# Patient Record
Sex: Male | Born: 1965 | Race: White | Hispanic: No | Smoking: Former smoker
Health system: Southern US, Community
[De-identification: ages and names within clinical notes are randomized; demographics above are authoritative.]

## PROBLEM LIST (undated history)

## (undated) DIAGNOSIS — M5412 Radiculopathy, cervical region: Secondary | ICD-10-CM

## (undated) DIAGNOSIS — K219 Gastro-esophageal reflux disease without esophagitis: Secondary | ICD-10-CM

## (undated) DIAGNOSIS — U071 COVID-19: Secondary | ICD-10-CM

## (undated) DIAGNOSIS — G9341 Metabolic encephalopathy: Secondary | ICD-10-CM

## (undated) DIAGNOSIS — Q248 Other specified congenital malformations of heart: Secondary | ICD-10-CM

## (undated) DIAGNOSIS — R519 Headache, unspecified: Secondary | ICD-10-CM

## (undated) DIAGNOSIS — E876 Hypokalemia: Secondary | ICD-10-CM

## (undated) DIAGNOSIS — J189 Pneumonia, unspecified organism: Secondary | ICD-10-CM

## (undated) DIAGNOSIS — I219 Acute myocardial infarction, unspecified: Secondary | ICD-10-CM

## (undated) DIAGNOSIS — R0600 Dyspnea, unspecified: Secondary | ICD-10-CM

## (undated) DIAGNOSIS — G8929 Other chronic pain: Secondary | ICD-10-CM

## (undated) DIAGNOSIS — I639 Cerebral infarction, unspecified: Secondary | ICD-10-CM

## (undated) DIAGNOSIS — R339 Retention of urine, unspecified: Secondary | ICD-10-CM

## (undated) DIAGNOSIS — N289 Disorder of kidney and ureter, unspecified: Secondary | ICD-10-CM

## (undated) DIAGNOSIS — F4310 Post-traumatic stress disorder, unspecified: Secondary | ICD-10-CM

## (undated) DIAGNOSIS — J9601 Acute respiratory failure with hypoxia: Secondary | ICD-10-CM

## (undated) DIAGNOSIS — M1990 Unspecified osteoarthritis, unspecified site: Secondary | ICD-10-CM

## (undated) DIAGNOSIS — D649 Anemia, unspecified: Secondary | ICD-10-CM

## (undated) DIAGNOSIS — K922 Gastrointestinal hemorrhage, unspecified: Secondary | ICD-10-CM

## (undated) DIAGNOSIS — Z77098 Contact with and (suspected) exposure to other hazardous, chiefly nonmedicinal, chemicals: Secondary | ICD-10-CM

## (undated) HISTORY — DX: Acute myocardial infarction, unspecified: I21.9

## (undated) HISTORY — DX: Gastro-esophageal reflux disease without esophagitis: K21.9

## (undated) HISTORY — DX: Hypomagnesemia: E83.42

## (undated) HISTORY — PX: INGUINAL HERNIA REPAIR: SHX194

## (undated) HISTORY — PX: COLONOSCOPY: SHX174

## (undated) HISTORY — PX: GASTRIC BYPASS: SHX52

## (undated) HISTORY — DX: Hypokalemia: E87.6

## (undated) HISTORY — DX: COVID-19: U07.1

## (undated) HISTORY — PX: HERNIA REPAIR: SHX51

## (undated) HISTORY — PX: LAPAROSCOPIC INGUINAL HERNIA REPAIR: SUR788

## (undated) HISTORY — DX: Cerebral infarction, unspecified: I63.9

## (undated) HISTORY — PX: CHOLECYSTECTOMY: SHX55

---
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
## 1992-04-01 HISTORY — PX: GASTRIC BYPASS: SHX52

## 2002-11-10 ENCOUNTER — Ambulatory Visit (HOSPITAL_COMMUNITY): Admission: RE | Admit: 2002-11-10 | Discharge: 2002-11-11 | Payer: Self-pay | Admitting: Orthopaedic Surgery

## 2002-11-10 ENCOUNTER — Encounter: Payer: Self-pay | Admitting: Orthopaedic Surgery

## 2002-11-11 ENCOUNTER — Encounter: Payer: Self-pay | Admitting: Orthopaedic Surgery

## 2006-12-22 ENCOUNTER — Encounter: Admission: RE | Admit: 2006-12-22 | Discharge: 2006-12-22 | Payer: Self-pay | Admitting: Orthopedic Surgery

## 2010-08-17 NOTE — Op Note (Signed)
NAME:  Chase [name], Chase [name]                       ACCOUNT NO.:  [id number]   MEDICAL RECORD NO.:  [id number]                   PATIENT TYPE:  OIB   LOCATION:  5020                                 FACILITY:  [facility]   PHYSICIAN:  Rennis Harding, M.D.                     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   PROCEDURE:  1. Anterior cervical diskectomy C3-4 with bilateral foraminotomies.  2. Anterior cervical arthrodesis with placement of an 8 mm Synthes allograft     prosthesis space were packed with demineralized bone matrix.  3. Anterior cervical plating utilizing the EBI view lock system.  4. Neurological monitoring utilizing somatosensory evoked response and upper     extremity electromyographs.   SURGEON:  Rennis Harding, M.D.   ASSISTANT:  Karenann Cai, P.A.   ANESTHESIA:  General endotracheal.   COMPLICATIONS:  None.   INDICATIONS:  The patient is a 45 year old male with a 2 year history of  left upper extremity pain.  Plain radiographs show loss of the normal  cervical lordosis and degenerative changes with disk space narrowing at C3-  4, C4-5 and C5-6.  Oblique images show foraminal stenosis on the left at C3-  4.  His MRI scan shows foraminal narrowing at C3-4, left greater than right,  secondary to uncovertebral spurring.  He had a selective nerve root  injection on the left at C3-4 with several days of relief.  Because of the  persistent symptoms refractory to all conservative treatment options, he has  elected to undergo anterior cervical diskectomy and fusion at C3-4 with  hopes of improving his symptoms.  Risks, benefits and alternatives were  extensively reviewed.   DESCRIPTION OF PROCEDURE:  The patient was identified in the holding  area  and taken to the operating room.  He underwent general endotracheal  anesthesia without difficulty.  He was given prophylactic IV antibiotics.  He was carefully positioned on the operating room table with [place] head  rest.  His neck was placed in slight hyperextension.  Neural monitoring was  established in the form of SSEPs and upper extremity EMGs.  The neck was  prepped and draped in the usual sterile fashion.  A 3 cm incision was made  on the left side of his neck and natural skin crease at the level of thyroid  cartilage.  Dissection was carried through the platysma sharply.  The  interval between the SCM and the strap muscles was developed down to the  prevertebral space.  The first disk space identified was C5-6, and this was  confirmed with lateral radiography.  We then expanded the exposure to the C3-  4 disk space.  A new x-ray  was taken, confirming we had the appropriate  level.  The longus coli muscle was elevated out over the C3-4 disk space  bilaterally.  The esophagus, trachea, carotid sheath was identified, and  protected at all times.  The deep Shadowline retractor was placed.  Diskectomy was performed, packs in the TLL.  We identified uncovertebral  spurring on the left greater than the right.  A high speed bur was used to  remove the uncovertebral joints.  Foraminotomies were performed with 2 mm  Kerrison punches bilaterally.  We confirmed the foramen was patent using a  blunt probe.  Caspar distraction pins were placed.  Gentle distraction was  applied.  An 8 mm Synthes allograft prosthesis spacer was then placed,  packed with demineralized bone matrix.  The endplates were prepared with  curets.  An EBI view lock plate was placed with four 14 mm screws.  We  confirmed that the locking mechanism engaged.  The wound was irrigated.  The  esophagus, trachea and carotid sheath were inspected.  There were no  apparent injuries.  Bleeding was controlled with bipolar  electrocautery and  Gelfoam.  A deep Penrose drain was placed.  The platysma was closed with a 2-  0 interrupted Vicryl, subcutaneous layer closed with a 3-0 subcuticular  Vicryl suture.  Skin was approximated with a 4-0 running subcuticular  suture.  Benzoin and Steri-Strips were placed.  Sterile dressing applied.  Patient placed into a soft collar.  He was extubated without difficulty,  transferred to the recovery room in stable condition able to move his upper  and lower extremities.  There were no deleterious changes in the SSEPs or  EMGs throughout the procedure.                                               Rennis Harding, M.D.    MC/MEDQ  D:  11/10/2002  T:  11/11/2002  Job:  [id number]

## 2010-08-17 NOTE — H&P (Signed)
NAME:  Chase [name], Chase [name]                      ACCOUNT NO.:  [id number]   MEDICAL RECORD NO.:  [id number]                   PATIENT TYPE:  OIB   LOCATION:  2550                                 FACILITY:  [facility]   PHYSICIAN:  Rennis Harding, M.D.                     DATE OF BIRTH:  [birth year]   DATE OF ADMISSION:  11/10/2002  DATE OF DISCHARGE:                                HISTORY & PHYSICAL   CHIEF COMPLAINT:  Neck and left upper extremity pain.   HISTORY OF PRESENT ILLNESS:  The patient is a 45 year old male who has had a  longstanding history of neck and upper extremity pain. He has tried  conservative management including anti-inflammatory medications, pain  medications, activity modification, and time.  Unfortunately, none of these  treatments have given any lasting relief of his symptoms.  He continues to  have pain in his neck as well as causing him headaches, and extending down  into his left upper extremity.  The pain has gotten to the point that it is  affecting his activities of daily living, quality of life, and ability to  work.  Therefore, the risks and benefits of the proposed surgery were  discussed with the patient by Dr. Patrice Paradise as well as myself; he indicated  understanding and opted to proceed.   ALLERGIES:  No known drug allergies.   MEDICATIONS:  Darvocet and Flexeril both p.r.n.   PAST MEDICAL HISTORY:  Healthy.   PAST SURGICAL HISTORY:  Hernia repair in 2001.  Gastric bypass in 1997.  Cholecystectomy in 1994.  Umbilical hernia as a child.   SOCIAL HISTORY:  The patient denies tobacco use.  He drinks approximately  one beer per day.  He does have family and friends to help him through his  postoperative course.   FAMILY HISTORY:  Noncontributory.   REVIEW OF SYSTEMS:  GENERAL:  The patient denies any fevers, chills, sweats,  blurry vision, double vision, paralysis.  CARDIOVASCULAR:  Denies chest  pain, angina, orthopnea, claudication, or palpitations.  PULMONARY: Denies  shortness of breath, productive cough, or hemoptysis.  GASTROINTESTINAL:  Denies nausea, vomiting, constipation, diarrhea, melena, or bloody stool.  GENITOURINARY:  Denies dysuria, hematuria, or discharge.  MUSCULOSKELETAL:  Per HPI.   PHYSICAL EXAMINATION:  GENERAL:  The patient is a 45 year old male who is  alert and oriented and in no acute distress.  Well developed, well  nourished.  Appears stated age.  Pleasant and cooperative through  examination.  HEENT:  Normocephalic and atraumatic.  Pupils equal, round, and reactive to  light.  Extraocular movements intact.  Nose patent and pharynx is clear.  NECK:  Supple to palpation. No lymphadenopathy, thyromegaly, or bruits  appreciated.  CHEST:  Clear to auscultation bilaterally.  No rales, rhonchi, or wheezes.  HEART:  Regular rate and rhythm, no murmurs, rubs, or gallops noted. S1 and  S2.  BREASTS:  Not  pertinent and not performed.  ABDOMEN:  Soft with positive bowel sounds throughout.  Nondistended and  nontender.  No organomegaly noted.  GENITOURINARY:  Not pertinent and not performed.  EXTREMITIES:  The patient has left upper extremity pain.  NEUROLOGICAL:  Motor function and sensation are grossly intact on  examination.  SKIN:  Intact without lesions or rashes.   DIAGNOSTIC STUDIES:  MRI shows degenerative disk disease causing foraminal  stenosis at C3-4 on the left.   IMPRESSION:  1. Degenerative disk disease of the cervical spine.  2. 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   PLAN:  The patient was admitted to [facility] on November 10, 2002,  to undergo an ACDF of C3-4. This will be done by Dr. Rennis Harding.   PRIMARY CARE PHYSICIAN:  Marguerita Beards. Gilman Schmidt, M.D., [facility] [place], P.A.-C                 Rennis Harding, M.D.    CPM/MEDQ  D:  11/10/2002  T:  11/10/2002  Job:  [id number]   cc:   Marguerita Beards., M.D., [facility] [place]

## 2012-04-20 DIAGNOSIS — M961 Postlaminectomy syndrome, not elsewhere classified: Secondary | ICD-10-CM | POA: Insufficient documentation

## 2012-04-20 DIAGNOSIS — M2550 Pain in unspecified joint: Secondary | ICD-10-CM | POA: Insufficient documentation

## 2012-12-10 DIAGNOSIS — D510 Vitamin B12 deficiency anemia due to intrinsic factor deficiency: Secondary | ICD-10-CM | POA: Insufficient documentation

## 2012-12-10 DIAGNOSIS — C8190 Hodgkin lymphoma, unspecified, unspecified site: Secondary | ICD-10-CM | POA: Diagnosis present

## 2012-12-10 DIAGNOSIS — D509 Iron deficiency anemia, unspecified: Secondary | ICD-10-CM | POA: Insufficient documentation

## 2012-12-10 HISTORY — DX: Hodgkin lymphoma, unspecified, unspecified site: C81.90

## 2013-05-19 DIAGNOSIS — Z8719 Personal history of other diseases of the digestive system: Secondary | ICD-10-CM | POA: Insufficient documentation

## 2013-05-19 DIAGNOSIS — Z8711 Personal history of peptic ulcer disease: Secondary | ICD-10-CM | POA: Insufficient documentation

## 2013-05-19 DIAGNOSIS — N529 Male erectile dysfunction, unspecified: Secondary | ICD-10-CM | POA: Insufficient documentation

## 2013-05-19 HISTORY — DX: Personal history of peptic ulcer disease: Z87.11

## 2015-09-04 DIAGNOSIS — M542 Cervicalgia: Secondary | ICD-10-CM | POA: Insufficient documentation

## 2016-06-19 DIAGNOSIS — M47812 Spondylosis without myelopathy or radiculopathy, cervical region: Secondary | ICD-10-CM | POA: Diagnosis present

## 2017-04-01 DIAGNOSIS — I639 Cerebral infarction, unspecified: Secondary | ICD-10-CM

## 2017-04-01 HISTORY — DX: Cerebral infarction, unspecified: I63.9

## 2018-04-01 DIAGNOSIS — U071 COVID-19: Secondary | ICD-10-CM

## 2018-04-01 HISTORY — DX: COVID-19: U07.1

## 2019-08-24 DIAGNOSIS — Z9884 Bariatric surgery status: Secondary | ICD-10-CM | POA: Insufficient documentation

## 2019-08-24 DIAGNOSIS — E871 Hypo-osmolality and hyponatremia: Secondary | ICD-10-CM | POA: Insufficient documentation

## 2019-08-24 DIAGNOSIS — N179 Acute kidney failure, unspecified: Secondary | ICD-10-CM | POA: Diagnosis present

## 2019-08-24 DIAGNOSIS — E872 Acidosis, unspecified: Secondary | ICD-10-CM | POA: Insufficient documentation

## 2019-08-24 DIAGNOSIS — Z87898 Personal history of other specified conditions: Secondary | ICD-10-CM | POA: Insufficient documentation

## 2019-08-24 DIAGNOSIS — G928 Other toxic encephalopathy: Secondary | ICD-10-CM | POA: Insufficient documentation

## 2019-08-24 DIAGNOSIS — N400 Enlarged prostate without lower urinary tract symptoms: Secondary | ICD-10-CM

## 2019-08-24 DIAGNOSIS — B965 Pseudomonas (aeruginosa) (mallei) (pseudomallei) as the cause of diseases classified elsewhere: Secondary | ICD-10-CM

## 2019-08-24 DIAGNOSIS — D649 Anemia, unspecified: Secondary | ICD-10-CM | POA: Insufficient documentation

## 2019-08-24 DIAGNOSIS — L989 Disorder of the skin and subcutaneous tissue, unspecified: Secondary | ICD-10-CM | POA: Insufficient documentation

## 2019-08-24 DIAGNOSIS — Z8616 Personal history of COVID-19: Secondary | ICD-10-CM

## 2019-08-24 DIAGNOSIS — G92 Toxic encephalopathy: Secondary | ICD-10-CM | POA: Insufficient documentation

## 2019-08-24 DIAGNOSIS — N390 Urinary tract infection, site not specified: Secondary | ICD-10-CM | POA: Diagnosis present

## 2019-08-24 DIAGNOSIS — R0600 Dyspnea, unspecified: Secondary | ICD-10-CM | POA: Insufficient documentation

## 2019-08-24 HISTORY — DX: Other toxic encephalopathy: G92.8

## 2019-08-24 HISTORY — DX: Personal history of COVID-19: Z86.16

## 2019-08-24 HISTORY — DX: Bariatric surgery status: Z98.84

## 2019-08-24 HISTORY — DX: Pseudomonas (aeruginosa) (mallei) (pseudomallei) as the cause of diseases classified elsewhere: B96.5

## 2019-10-17 ENCOUNTER — Encounter (HOSPITAL_COMMUNITY): Payer: Self-pay

## 2019-10-17 ENCOUNTER — Emergency Department (HOSPITAL_COMMUNITY): Payer: No Typology Code available for payment source

## 2019-10-17 ENCOUNTER — Other Ambulatory Visit: Payer: Self-pay

## 2019-10-17 ENCOUNTER — Inpatient Hospital Stay (HOSPITAL_COMMUNITY)
Admission: EM | Admit: 2019-10-17 | Discharge: 2019-10-26 | DRG: 064 | Disposition: A | Discharge status: Skilled Nursing Facility | Payer: No Typology Code available for payment source | Attending: Internal Medicine | Admitting: Internal Medicine

## 2019-10-17 DIAGNOSIS — I6340 Cerebral infarction due to embolism of unspecified cerebral artery: Principal | ICD-10-CM | POA: Diagnosis present

## 2019-10-17 DIAGNOSIS — N179 Acute kidney failure, unspecified: Secondary | ICD-10-CM | POA: Diagnosis not present

## 2019-10-17 DIAGNOSIS — R64 Cachexia: Secondary | ICD-10-CM | POA: Diagnosis present

## 2019-10-17 DIAGNOSIS — E876 Hypokalemia: Secondary | ICD-10-CM | POA: Diagnosis present

## 2019-10-17 DIAGNOSIS — W19XXXA Unspecified fall, initial encounter: Secondary | ICD-10-CM | POA: Diagnosis not present

## 2019-10-17 DIAGNOSIS — R29810 Facial weakness: Secondary | ICD-10-CM | POA: Diagnosis present

## 2019-10-17 DIAGNOSIS — R233 Spontaneous ecchymoses: Secondary | ICD-10-CM | POA: Diagnosis present

## 2019-10-17 DIAGNOSIS — Z923 Personal history of irradiation: Secondary | ICD-10-CM

## 2019-10-17 DIAGNOSIS — G8194 Hemiplegia, unspecified affecting left nondominant side: Secondary | ICD-10-CM | POA: Diagnosis present

## 2019-10-17 DIAGNOSIS — G894 Chronic pain syndrome: Secondary | ICD-10-CM | POA: Diagnosis present

## 2019-10-17 DIAGNOSIS — D6959 Other secondary thrombocytopenia: Secondary | ICD-10-CM | POA: Diagnosis present

## 2019-10-17 DIAGNOSIS — Z79899 Other long term (current) drug therapy: Secondary | ICD-10-CM

## 2019-10-17 DIAGNOSIS — I2510 Atherosclerotic heart disease of native coronary artery without angina pectoris: Secondary | ICD-10-CM | POA: Diagnosis present

## 2019-10-17 DIAGNOSIS — R778 Other specified abnormalities of plasma proteins: Secondary | ICD-10-CM

## 2019-10-17 DIAGNOSIS — N323 Diverticulum of bladder: Secondary | ICD-10-CM | POA: Diagnosis present

## 2019-10-17 DIAGNOSIS — N401 Enlarged prostate with lower urinary tract symptoms: Secondary | ICD-10-CM | POA: Diagnosis present

## 2019-10-17 DIAGNOSIS — D631 Anemia in chronic kidney disease: Secondary | ICD-10-CM | POA: Diagnosis present

## 2019-10-17 DIAGNOSIS — I63523 Cerebral infarction due to unspecified occlusion or stenosis of bilateral anterior cerebral arteries: Secondary | ICD-10-CM | POA: Diagnosis not present

## 2019-10-17 DIAGNOSIS — Z8249 Family history of ischemic heart disease and other diseases of the circulatory system: Secondary | ICD-10-CM

## 2019-10-17 DIAGNOSIS — C8590 Non-Hodgkin lymphoma, unspecified, unspecified site: Secondary | ICD-10-CM | POA: Diagnosis not present

## 2019-10-17 DIAGNOSIS — K219 Gastro-esophageal reflux disease without esophagitis: Secondary | ICD-10-CM | POA: Diagnosis present

## 2019-10-17 DIAGNOSIS — Z886 Allergy status to analgesic agent status: Secondary | ICD-10-CM

## 2019-10-17 DIAGNOSIS — G8921 Chronic pain due to trauma: Secondary | ICD-10-CM | POA: Diagnosis not present

## 2019-10-17 DIAGNOSIS — R338 Other retention of urine: Secondary | ICD-10-CM | POA: Diagnosis present

## 2019-10-17 DIAGNOSIS — N136 Pyonephrosis: Secondary | ICD-10-CM | POA: Diagnosis present

## 2019-10-17 DIAGNOSIS — R29714 NIHSS score 14: Secondary | ICD-10-CM | POA: Diagnosis present

## 2019-10-17 DIAGNOSIS — F17220 Nicotine dependence, chewing tobacco, uncomplicated: Secondary | ICD-10-CM | POA: Diagnosis present

## 2019-10-17 DIAGNOSIS — N185 Chronic kidney disease, stage 5: Secondary | ICD-10-CM

## 2019-10-17 DIAGNOSIS — R531 Weakness: Secondary | ICD-10-CM | POA: Diagnosis present

## 2019-10-17 DIAGNOSIS — I248 Other forms of acute ischemic heart disease: Secondary | ICD-10-CM | POA: Diagnosis present

## 2019-10-17 DIAGNOSIS — D696 Thrombocytopenia, unspecified: Secondary | ICD-10-CM

## 2019-10-17 DIAGNOSIS — J984 Other disorders of lung: Secondary | ICD-10-CM | POA: Diagnosis present

## 2019-10-17 DIAGNOSIS — Z681 Body mass index (BMI) 19 or less, adult: Secondary | ICD-10-CM | POA: Diagnosis not present

## 2019-10-17 DIAGNOSIS — L89151 Pressure ulcer of sacral region, stage 1: Secondary | ICD-10-CM | POA: Diagnosis present

## 2019-10-17 DIAGNOSIS — R5381 Other malaise: Secondary | ICD-10-CM | POA: Diagnosis present

## 2019-10-17 DIAGNOSIS — I639 Cerebral infarction, unspecified: Secondary | ICD-10-CM | POA: Diagnosis not present

## 2019-10-17 DIAGNOSIS — N184 Chronic kidney disease, stage 4 (severe): Secondary | ICD-10-CM

## 2019-10-17 DIAGNOSIS — N390 Urinary tract infection, site not specified: Secondary | ICD-10-CM | POA: Diagnosis not present

## 2019-10-17 DIAGNOSIS — N3000 Acute cystitis without hematuria: Secondary | ICD-10-CM

## 2019-10-17 DIAGNOSIS — I6389 Other cerebral infarction: Secondary | ICD-10-CM | POA: Diagnosis not present

## 2019-10-17 DIAGNOSIS — I63413 Cerebral infarction due to embolism of bilateral middle cerebral arteries: Secondary | ICD-10-CM

## 2019-10-17 DIAGNOSIS — G8929 Other chronic pain: Secondary | ICD-10-CM | POA: Diagnosis present

## 2019-10-17 DIAGNOSIS — F4310 Post-traumatic stress disorder, unspecified: Secondary | ICD-10-CM | POA: Diagnosis not present

## 2019-10-17 DIAGNOSIS — B965 Pseudomonas (aeruginosa) (mallei) (pseudomallei) as the cause of diseases classified elsewhere: Secondary | ICD-10-CM | POA: Diagnosis present

## 2019-10-17 DIAGNOSIS — Z20822 Contact with and (suspected) exposure to covid-19: Secondary | ICD-10-CM | POA: Diagnosis present

## 2019-10-17 DIAGNOSIS — E872 Acidosis: Secondary | ICD-10-CM | POA: Diagnosis present

## 2019-10-17 DIAGNOSIS — H5461 Unqualified visual loss, right eye, normal vision left eye: Secondary | ICD-10-CM | POA: Diagnosis present

## 2019-10-17 DIAGNOSIS — E785 Hyperlipidemia, unspecified: Secondary | ICD-10-CM | POA: Diagnosis present

## 2019-10-17 DIAGNOSIS — E538 Deficiency of other specified B group vitamins: Secondary | ICD-10-CM | POA: Diagnosis present

## 2019-10-17 DIAGNOSIS — Z9884 Bariatric surgery status: Secondary | ICD-10-CM

## 2019-10-17 DIAGNOSIS — R296 Repeated falls: Secondary | ICD-10-CM | POA: Diagnosis present

## 2019-10-17 DIAGNOSIS — I4891 Unspecified atrial fibrillation: Secondary | ICD-10-CM | POA: Diagnosis present

## 2019-10-17 DIAGNOSIS — Z79891 Long term (current) use of opiate analgesic: Secondary | ICD-10-CM

## 2019-10-17 DIAGNOSIS — W57XXXA Bitten or stung by nonvenomous insect and other nonvenomous arthropods, initial encounter: Secondary | ICD-10-CM | POA: Diagnosis present

## 2019-10-17 DIAGNOSIS — E43 Unspecified severe protein-calorie malnutrition: Secondary | ICD-10-CM | POA: Diagnosis present

## 2019-10-17 DIAGNOSIS — R7989 Other specified abnormal findings of blood chemistry: Secondary | ICD-10-CM

## 2019-10-17 DIAGNOSIS — M21372 Foot drop, left foot: Secondary | ICD-10-CM | POA: Diagnosis present

## 2019-10-17 HISTORY — DX: Chronic kidney disease, stage 4 (severe): N18.4

## 2019-10-17 HISTORY — DX: Retention of urine, unspecified: R33.9

## 2019-10-17 HISTORY — DX: Contact with and (suspected) exposure to other hazardous, chiefly nonmedicinal, chemicals: Z77.098

## 2019-10-17 HISTORY — DX: Gastrointestinal hemorrhage, unspecified: K92.2

## 2019-10-17 HISTORY — DX: Radiculopathy, cervical region: M54.12

## 2019-10-17 HISTORY — DX: Disorder of kidney and ureter, unspecified: N28.9

## 2019-10-17 HISTORY — DX: Post-traumatic stress disorder, unspecified: F43.10

## 2019-10-17 HISTORY — DX: Weakness: R53.1

## 2019-10-17 HISTORY — DX: Other chronic pain: G89.29

## 2019-10-17 LAB — RAPID URINE DRUG SCREEN, HOSP PERFORMED
Amphetamines: NOT DETECTED
Barbiturates: NOT DETECTED
Benzodiazepines: NOT DETECTED
Cocaine: NOT DETECTED
Opiates: NOT DETECTED
Tetrahydrocannabinol: NOT DETECTED

## 2019-10-17 LAB — I-STAT CHEM 8, ED
BUN: 38 mg/dL — ABNORMAL HIGH (ref 6–20)
Calcium, Ion: 1.15 mmol/L (ref 1.15–1.40)
Chloride: 108 mmol/L (ref 98–111)
Creatinine, Ser: 4.1 mg/dL — ABNORMAL HIGH (ref 0.61–1.24)
Glucose, Bld: 79 mg/dL (ref 70–99)
HCT: 27 % — ABNORMAL LOW (ref 39.0–52.0)
Hemoglobin: 9.2 g/dL — ABNORMAL LOW (ref 13.0–17.0)
Potassium: 3.1 mmol/L — ABNORMAL LOW (ref 3.5–5.1)
Sodium: 137 mmol/L (ref 135–145)
TCO2: 15 mmol/L — ABNORMAL LOW (ref 22–32)

## 2019-10-17 LAB — URINALYSIS, ROUTINE W REFLEX MICROSCOPIC
Bilirubin Urine: NEGATIVE
Glucose, UA: NEGATIVE mg/dL
Ketones, ur: NEGATIVE mg/dL
Nitrite: NEGATIVE
Protein, ur: 30 mg/dL — AB
Specific Gravity, Urine: 1.008 (ref 1.005–1.030)
WBC, UA: >50 WBC/hpf — ABNORMAL HIGH (ref 0–5)
pH: 7 (ref 5.0–8.0)

## 2019-10-17 LAB — DIFFERENTIAL
Abs Immature Granulocytes: 0.28 10*3/uL — ABNORMAL HIGH (ref 0.00–0.07)
Basophils Absolute: 0 10*3/uL (ref 0.0–0.1)
Basophils Relative: 0 %
Eosinophils Absolute: 0 10*3/uL (ref 0.0–0.5)
Eosinophils Relative: 0 %
Immature Granulocytes: 2 %
Lymphocytes Relative: 2 %
Lymphs Abs: 0.4 10*3/uL — ABNORMAL LOW (ref 0.7–4.0)
Monocytes Absolute: 0.4 10*3/uL (ref 0.1–1.0)
Monocytes Relative: 2 %
Neutro Abs: 17.7 10*3/uL — ABNORMAL HIGH (ref 1.7–7.7)
Neutrophils Relative %: 94 %

## 2019-10-17 LAB — PROTIME-INR
INR: 1.5 — ABNORMAL HIGH (ref 0.8–1.2)
Prothrombin Time: 17.7 seconds — ABNORMAL HIGH (ref 11.4–15.2)

## 2019-10-17 LAB — CBC
HCT: 26.5 % — ABNORMAL LOW (ref 39.0–52.0)
Hemoglobin: 8.6 g/dL — ABNORMAL LOW (ref 13.0–17.0)
MCH: 29.1 pg (ref 26.0–34.0)
MCHC: 32.5 g/dL (ref 30.0–36.0)
MCV: 89.5 fL (ref 80.0–100.0)
Platelets: 69 10*3/uL — ABNORMAL LOW (ref 150–400)
RBC: 2.96 MIL/uL — ABNORMAL LOW (ref 4.22–5.81)
RDW: 17.2 % — ABNORMAL HIGH (ref 11.5–15.5)
WBC: 18.8 10*3/uL — ABNORMAL HIGH (ref 4.0–10.5)
nRBC: 0 % (ref 0.0–0.2)

## 2019-10-17 LAB — LACTATE DEHYDROGENASE: LDH: 120 U/L (ref 98–192)

## 2019-10-17 LAB — COMPREHENSIVE METABOLIC PANEL
ALT: 18 U/L (ref 0–44)
AST: 18 U/L (ref 15–41)
Albumin: 2 g/dL — ABNORMAL LOW (ref 3.5–5.0)
Alkaline Phosphatase: 96 U/L (ref 38–126)
Anion gap: 9 (ref 5–15)
BUN: 42 mg/dL — ABNORMAL HIGH (ref 6–20)
CO2: 13 mmol/L — ABNORMAL LOW (ref 22–32)
Calcium: 7.4 mg/dL — ABNORMAL LOW (ref 8.9–10.3)
Chloride: 111 mmol/L (ref 98–111)
Creatinine, Ser: 3.74 mg/dL — ABNORMAL HIGH (ref 0.61–1.24)
GFR calc Af Amer: 20 mL/min — ABNORMAL LOW (ref >60)
GFR calc non Af Amer: 17 mL/min — ABNORMAL LOW (ref >60)
Glucose, Bld: 82 mg/dL (ref 70–99)
Potassium: 3.1 mmol/L — ABNORMAL LOW (ref 3.5–5.1)
Sodium: 133 mmol/L — ABNORMAL LOW (ref 135–145)
Total Bilirubin: 0.4 mg/dL (ref 0.3–1.2)
Total Protein: 5.6 g/dL — ABNORMAL LOW (ref 6.5–8.1)

## 2019-10-17 LAB — CBG MONITORING, ED: Glucose-Capillary: 64 mg/dL — ABNORMAL LOW (ref 70–99)

## 2019-10-17 LAB — ETHANOL: Alcohol, Ethyl (B): <10 mg/dL (ref <10)

## 2019-10-17 LAB — TROPONIN I (HIGH SENSITIVITY)
Troponin I (High Sensitivity): 308 ng/L (ref <18)
Troponin I (High Sensitivity): 300 ng/L (ref <18)
Troponin I (High Sensitivity): 313 ng/L (ref <18)

## 2019-10-17 LAB — DIC (DISSEMINATED INTRAVASCULAR COAGULATION)PANEL
D-Dimer, Quant: 3.19 ug/mL-FEU — ABNORMAL HIGH (ref 0.00–0.50)
Fibrinogen: 258 mg/dL (ref 210–475)
INR: 1.6 — ABNORMAL HIGH (ref 0.8–1.2)
Platelets: 68 10*3/uL — ABNORMAL LOW (ref 150–400)
Prothrombin Time: 18.8 seconds — ABNORMAL HIGH (ref 11.4–15.2)
aPTT: 46 seconds — ABNORMAL HIGH (ref 24–36)

## 2019-10-17 LAB — TYPE AND SCREEN
ABO/RH(D): O POS
Antibody Screen: NEGATIVE

## 2019-10-17 LAB — APTT: aPTT: 43 seconds — ABNORMAL HIGH (ref 24–36)

## 2019-10-17 LAB — SARS CORONAVIRUS 2 BY RT PCR (HOSPITAL ORDER, PERFORMED IN ~~LOC~~ HOSPITAL LAB): SARS Coronavirus 2: NEGATIVE

## 2019-10-17 LAB — ABO/RH: ABO/RH(D): O POS

## 2019-10-17 MED ORDER — ACETAMINOPHEN 160 MG/5ML PO SOLN: 650.0000 mg | ORAL | Status: DC | PRN

## 2019-10-17 MED ORDER — GABAPENTIN 300 MG PO CAPS
300.0000 mg | ORAL_CAPSULE | Freq: Two times a day (BID) | ORAL | Status: DC
  Administered 2019-10-17 – 2019-10-26 (×18): 300 mg via ORAL
  Filled 2019-10-17 (×18): qty 1

## 2019-10-17 MED ORDER — ASPIRIN 325 MG PO TABS
325.0000 mg | ORAL_TABLET | Freq: Every day | ORAL | Status: DC
  Filled 2019-10-17: qty 1

## 2019-10-17 MED ORDER — SODIUM CHLORIDE 0.9 % IV BOLUS
1000.0000 mL | Freq: Once | INTRAVENOUS | Status: AC
  Administered 2019-10-17: 1000 mL via INTRAVENOUS

## 2019-10-17 MED ORDER — ACETAMINOPHEN 650 MG RE SUPP: 650.0000 mg | RECTAL | Status: DC | PRN

## 2019-10-17 MED ORDER — HYDROXYZINE HCL 10 MG PO TABS
10.0000 mg | ORAL_TABLET | Freq: Three times a day (TID) | ORAL | Status: DC | PRN
  Filled 2019-10-17: qty 1

## 2019-10-17 MED ORDER — PANTOPRAZOLE SODIUM 40 MG PO TBEC
40.0000 mg | DELAYED_RELEASE_TABLET | Freq: Two times a day (BID) | ORAL | Status: DC
  Administered 2019-10-17 – 2019-10-26 (×18): 40 mg via ORAL
  Filled 2019-10-17 (×18): qty 1

## 2019-10-17 MED ORDER — ATORVASTATIN CALCIUM 40 MG PO TABS
40.0000 mg | ORAL_TABLET | Freq: Every day | ORAL | Status: DC
  Administered 2019-10-17 – 2019-10-18 (×2): 40 mg via ORAL
  Filled 2019-10-17 (×2): qty 1

## 2019-10-17 MED ORDER — SODIUM CHLORIDE 0.9 % IV SOLN
2.0000 g | Freq: Once | INTRAVENOUS | Status: AC
  Administered 2019-10-17: 2 g via INTRAVENOUS
  Filled 2019-10-17: qty 20

## 2019-10-17 MED ORDER — POTASSIUM CHLORIDE CRYS ER 20 MEQ PO TBCR
40.0000 meq | EXTENDED_RELEASE_TABLET | Freq: Once | ORAL | Status: AC
  Administered 2019-10-17: 40 meq via ORAL
  Filled 2019-10-17: qty 2

## 2019-10-17 MED ORDER — DULOXETINE HCL 30 MG PO CPEP
60.0000 mg | ORAL_CAPSULE | Freq: Every day | ORAL | Status: DC
  Administered 2019-10-18 – 2019-10-26 (×9): 60 mg via ORAL
  Filled 2019-10-17 (×10): qty 2

## 2019-10-17 MED ORDER — STROKE: EARLY STAGES OF RECOVERY BOOK
Freq: Once | Status: AC
  Filled 2019-10-17: qty 1

## 2019-10-17 MED ORDER — TAMSULOSIN HCL 0.4 MG PO CAPS
0.4000 mg | ORAL_CAPSULE | Freq: Every day | ORAL | Status: DC
  Administered 2019-10-18 – 2019-10-26 (×9): 0.4 mg via ORAL
  Filled 2019-10-17 (×9): qty 1

## 2019-10-17 MED ORDER — DOXYCYCLINE HYCLATE 100 MG PO TABS
100.0000 mg | ORAL_TABLET | Freq: Two times a day (BID) | ORAL | Status: DC
  Administered 2019-10-17 – 2019-10-18 (×2): 100 mg via ORAL
  Filled 2019-10-17 (×2): qty 1

## 2019-10-17 MED ORDER — SODIUM CHLORIDE 0.9 % IV SOLN: INTRAVENOUS | Status: DC

## 2019-10-17 MED ORDER — ACETAMINOPHEN 325 MG PO TABS
650.0000 mg | ORAL_TABLET | ORAL | Status: DC | PRN
  Administered 2019-10-23 – 2019-10-24 (×3): 650 mg via ORAL
  Filled 2019-10-17 (×3): qty 2

## 2019-10-17 MED ORDER — HYDROMORPHONE HCL 1 MG/ML IJ SOLN: 0.5000 mg | INTRAMUSCULAR | Status: DC | PRN

## 2019-10-17 MED ORDER — IOHEXOL 350 MG/ML SOLN
75.0000 mL | Freq: Once | INTRAVENOUS | Status: AC | PRN
  Administered 2019-10-17: 75 mL via INTRAVENOUS

## 2019-10-17 MED ORDER — DULOXETINE HCL 30 MG PO CPEP
30.0000 mg | ORAL_CAPSULE | Freq: Every day | ORAL | Status: DC
  Administered 2019-10-17 – 2019-10-25 (×9): 30 mg via ORAL
  Filled 2019-10-17 (×9): qty 1

## 2019-10-17 MED ORDER — TRAMADOL HCL 50 MG PO TABS
50.0000 mg | ORAL_TABLET | Freq: Two times a day (BID) | ORAL | Status: DC
  Administered 2019-10-17 – 2019-10-18 (×3): 50 mg via ORAL
  Filled 2019-10-17 (×3): qty 1

## 2019-10-17 MED ORDER — GABAPENTIN 400 MG PO CAPS: 400.0000 mg | ORAL_CAPSULE | Freq: Three times a day (TID) | ORAL | Status: DC

## 2019-10-17 MED ORDER — SODIUM BICARBONATE 650 MG PO TABS
325.0000 mg | ORAL_TABLET | Freq: Three times a day (TID) | ORAL | Status: DC
  Administered 2019-10-17 – 2019-10-19 (×5): 325 mg via ORAL
  Filled 2019-10-17 (×5): qty 1

## 2019-10-17 MED ORDER — ASPIRIN 300 MG RE SUPP: 300.0000 mg | Freq: Every day | RECTAL | Status: DC

## 2019-10-17 MED ORDER — MIDODRINE HCL 5 MG PO TABS
2.5000 mg | ORAL_TABLET | Freq: Three times a day (TID) | ORAL | Status: DC
  Administered 2019-10-18 – 2019-10-26 (×25): 2.5 mg via ORAL
  Filled 2019-10-17 (×24): qty 1

## 2019-10-17 NOTE — Progress Notes (Signed)
Code Stroke Time Documentation   2217 Call Time 1232 Beeper Time 9810 Exam Started  2548 Exam [id number] Images sent to [place] Exam completed in [facility] radiology called

## 2019-10-17 NOTE — Consult Note (Signed)
[facility]urology Consult Services   Date of Service:   10/17/2019 12:41:37  Impression:     .  I63.9 - Cerebrovascular accident (CVA), unspecified mechanism ([place])  Comments/Sign-Out: 54 year old male who presents to the hospital because of left side weakness. presentation concerning for possible acute stroke.  Metrics: Last Known Well: 10/16/2019 21:30:00 [facility] Notification Time: 10/17/2019 12:41:36 Arrival Time: 10/17/2019 12:10:00 Stamp Time: 10/17/2019 12:41:37 Time First Login Attempt: 10/17/2019 12:47:00 Symptoms: Left side weakness NIHSS Start Assessment Time: 10/17/2019 12:55:00 Patient is not a candidate for Thrombolytic. Thrombolytic Medical Decision: 10/17/2019 12:52:36 Patient was not deemed candidate for Thrombolytic because of following reasons: Last Well Known Above 4.5 Hours.  CT head showed no acute hemorrhage or acute core infarct.  ED Physician notified of diagnostic impression and management plan on 10/17/2019 13:08:30  Advanced Imaging: CTA Head and Neck Completed.  LVO:No   Our recommendations are outlined below.  Recommendations:     .  Activate Stroke Protocol Admission/Order Set     .  Stroke/Telemetry Floor     .  Neuro Checks     .  Bedside Swallow Eval     .  DVT Prophylaxis     .  IV Fluids, Normal Saline     .  Head of Bed 30 Degrees     .  Euglycemia and Avoid Hyperthermia (PRN Acetaminophen)     .  Antiplatelet Therapy Recommended  Routine Consultation with [facility] for Follow up Care  Sign Out:     .  Discussed with Emergency Department Provider    ------------------------------------------------------------------------------  History of Present Illness: Patient is a 54 year old Male.  Patient was brought by EMS for symptoms of Left side weakness  54 year old male who presents to the hospital because of left arm and leg weakness. Patient was last normal when he went to bed  last night and when he woke up this morning he was unable to move his left side. Initially in the ED patient was moving his left arm spontaneously but would not move to command on teleneuro evaluation.           Examination: BP(100/68), Pulse(62), Blood Glucose(64) 1A: Level of Consciousness - Alert; keenly responsive + 0 1B: Ask Month and Age - Both Questions Right + 0 1C: Blink Eyes & Squeeze Hands - Performs Both Tasks + 0 2: Test Horizontal Extraocular Movements - Normal + 0 3: Test Visual Fields - No Visual Loss + 0 4: Test Facial Palsy (Use Grimace if Obtunded) - Normal symmetry + 0 5A: Test Left Arm Motor Drift - No Effort Against Gravity + 3 5B: Test Right Arm Motor Drift - No Drift for 10 Seconds + 0 6A: Test Left Leg Motor Drift - No Effort Against Gravity + 3 6B: Test Right Leg Motor Drift - No Drift for 5 Seconds + 0 7: Test Limb Ataxia (FNF/Heel-Shin) - No Ataxia + 0 8: Test Sensation - Mild-Moderate Loss: Less Sharp/More Dull + 1 9: Test Language/Aphasia - Normal; No aphasia + 0 10: Test Dysarthria - Normal + 0 11: Test Extinction/Inattention - No abnormality + 0  NIHSS Score: 7  Pre-Morbid Modified Rankin Scale: Unable to assess     Patient is being evaluated for possible acute neurologic impairment and high probability of imminent or life-threatening deterioration. I spent total of 35 minutes providing care to this patient, including time for face to face visit via telemedicine, review of medical records, imaging studies  and discussion of findings with providers, the patient and/or family.   Dr Tsosie Billing   [facility] [phone]  Case [id number]

## 2019-10-17 NOTE — ED Notes (Signed)
Lattie Haw, RN on screen but screen is frozen

## 2019-10-17 NOTE — Progress Notes (Signed)
Patient arrived to unit A/O x3. Vitals stable. Orders initiated. Bed in low position, alarm on , and call bell within reach. Neurology notified of patient arrival.  Will continue to monitor.  Gwendolyn Grant, RN

## 2019-10-17 NOTE — ED Notes (Signed)
covid spec to lab  Tick found attached to pt posterior neck   It is removed by physician

## 2019-10-17 NOTE — ED Notes (Signed)
Report to [facility], [employer] 

## 2019-10-17 NOTE — ED Notes (Signed)
CRITICAL VALUE ALERT  Critical Value:  Troponin 308  Date & Time Notied:  10/17/19 1354  Provider Notified: Dr. Langston Masker  Orders Received/Actions taken: EDP notified. Orders given for dissection testing. Jeris Penta, primary RN notified as well.

## 2019-10-17 NOTE — ED Notes (Signed)
Call to Son  Rm at [facility] given along w main number

## 2019-10-17 NOTE — ED Notes (Signed)
Followed by Chase [name] Has appt tomorrow   Reports went to bed last night today awakened and fell to the left sitting up - then fell out of bed   In ED makes sffort to raise L arm, but cannot   Cannot raise either R or L leg   Wound to R shin "where I fell"   Pt is alert, responds to questions with appropriate answers   Identifies all pictures on stroke eval -

## 2019-10-17 NOTE — ED Notes (Signed)
Call to son

## 2019-10-17 NOTE — ED Notes (Signed)
Report to [place], RN [employer] [facility]

## 2019-10-17 NOTE — ED Notes (Signed)
Call from lab  Trop elevated   phy concerned for disection

## 2019-10-17 NOTE — ED Notes (Signed)
[facility] here for pt. 

## 2019-10-17 NOTE — ED Notes (Signed)
hospitalist in to eval 

## 2019-10-17 NOTE — ED Provider Notes (Signed)
[facility] EMERGENCY DEPARTMENT Provider Note   CSN: [id number] Arrival date & time: 10/17/19  1210  An emergency department physician performed an initial assessment on this suspected stroke patient at 1230.  History Chief Complaint  Patient presents with  . Left sided weakness    Chase [name] is a 54 y.o. male presented emergency department with left-sided weakness.  Patient reports he went to bed around 9 or 10 last night was feeling normal at that time.  He said he woke up this morning feeling extreme weakness in his left side.  He said he is been falling to his left.  He cannot feel his left arm or leg.  He is able to lift his left arm but not his left leg.  Normally he can walk and is independent at baseline.  He lives by himself.  He reports to me that a history of anemia but cannot provide further information.  He is not on blood thinners.  Follows at [facility].  He denies a headache but feels very lightheaded and tired.  Update - I reached his son Zade Falkner by phone approx 1.5 hours after his arrival.  His son clarifies that his father has been "in and out of the hospital nonstop for the past 3 years," and says he left the hospital about 1.5 months ago with a new indwelling foley catheter at that time due to a bladder mass causing outlet obstruction.  He has not had that foley changed over.  His son tells me the patient normally has some left sided leg weakness at baseline and has been "falling a lot" but has been able to ambulate with a cane.  His son tells me his father has chronic kidney disease.  He feels his father has not been caring for himself.  His son does not live with his father.    HPI     Past Medical History:  Diagnosis Date  . Chemical exposure    service related injury  . PTSD (post-traumatic stress disorder)   . Renal disorder     Patient Active Problem List   Diagnosis Date Noted  . Acute left-sided weakness 10/17/2019  . CKD (chronic  kidney disease), stage IV ([place]) 10/17/2019  . Chronic pain 10/17/2019  . Fall 10/17/2019  . Acute lower UTI 10/17/2019    Past Surgical History:  Procedure Laterality Date  . GASTRIC BYPASS    . HERNIA REPAIR         No family history on file.  Social History   Tobacco Use  . Smoking status: Never Smoker  . Smokeless tobacco: Current User  Substance Use Topics  . Alcohol use: Never  . Drug use: Never    Home Medications Prior to Admission medications   Medication Sig Start Date End Date Taking? Authorizing Provider  cyanocobalamin (,VITAMIN B-12,) 1000 MCG/ML injection Inject into the muscle. 12/28/13  Yes [provider]  DULoxetine (CYMBALTA) 30 MG capsule Take 30-60 mg by mouth daily. Take 2 capsules in the morning and 1 capsule in the evening 08/12/17  Yes [provider]  gabapentin (NEURONTIN) 800 MG tablet Take 3 tablets by mouth at bedtime. 09/14/19  Yes [provider]  hydrOXYzine (ATARAX/VISTARIL) 10 MG tablet TAKE 1-2 TABLET(S) BY MOUTH TWICE A DAY AS NEEDED 09/08/18  Yes [provider]  midodrine (PROAMATINE) 2.5 MG tablet Take 1 tablet by mouth 3 (three) times daily. 09/21/18  Yes [provider]  pantoprazole (PROTONIX) 40  MG tablet TAKE ONE TABLET BY MOUTH TWICE A DAY (TAKE ON AN EMPTY STOMACH 30 MINUTES PRIOR TO A MEAL) FOR REFLUX 06/09/18  Yes [provider]  tamsulosin (FLOMAX) 0.4 MG CAPS capsule Take by mouth.   Yes [provider]  traMADol (ULTRAM) 50 MG tablet Take 50 mg by mouth 3 (three) times daily.  09/14/19 11/13/19 Yes [provider]    Allergies    Nsaids  Review of Systems   Review of Systems  Constitutional: Negative for chills and fever.  HENT: Negative for ear pain and sore throat.   Eyes: Negative for photophobia and visual disturbance.  Respiratory: Negative for cough and shortness of breath.   Cardiovascular: Negative for chest pain and palpitations.   Gastrointestinal: Negative for abdominal pain and vomiting.  Genitourinary: Positive for difficulty urinating. Negative for dysuria.  Skin: Positive for pallor. Negative for color change.  Neurological: Positive for dizziness, weakness, light-headedness and numbness. Negative for seizures, syncope, speech difficulty and headaches.  Psychiatric/Behavioral: Negative for agitation and confusion.  All other systems reviewed and are negative.   Physical Exam Updated Vital Signs BP 115/66 (BP Location: Left Arm)   Pulse (!) 58   Temp (!) 97.4 F (36.3 C) (Oral)   Resp 11   Ht 5\' 8"  (1.727 m)   Wt 55.3 kg   SpO2 100%   BMI 18.55 kg/m   Physical Exam Vitals and nursing note reviewed.  Constitutional:      Appearance: He is well-developed. He is ill-appearing.     Comments: Pale, frail, appears tired  HENT:     Head: Normocephalic and atraumatic.  Eyes:     Pupils: Pupils are equal, round, and reactive to light.     Comments: Conjunctival pallor  Cardiovascular:     Rate and Rhythm: Normal rate and regular rhythm.     Pulses: Normal pulses.  Pulmonary:     Effort: Pulmonary effort is normal. No respiratory distress.     Breath sounds: Normal breath sounds.  Abdominal:     Palpations: Abdomen is soft.     Tenderness: There is no abdominal tenderness.  Genitourinary:    Comments: Indwelling foley with malodorous, cloudy urine draining in bag Musculoskeletal:     Cervical back: Neck supple.  Skin:    General: Skin is warm and dry.     Coloration: Skin is pale.     Comments: Tick found on left posterior neck with head embedded - entire tick removed with tweezers  Neurological:     Mental Status: He is alert.     GCS: GCS eye subscore is 4. GCS verbal subscore is 5. GCS motor subscore is 6.     Cranial Nerves: No dysarthria.     Comments: Left arm 3/5 strength, left leg 1/5 strength Right arm and leg with 4/5 strength No cervical midline tenderness No sensation in left side  of body (face, arm, leg) initially - subsequently reporting gross sensation intact in all regions, but paresthesias in his left leg No aphasia     ED Results / Procedures / Treatments   Labs (all labs ordered are listed, but only abnormal results are displayed) Labs Reviewed  PROTIME-INR - Abnormal; Notable for the following components:      Result Value   Prothrombin Time 17.7 (*)    INR 1.5 (*)    All other components within normal limits  APTT - Abnormal; Notable for the following components:   aPTT 43 (*)  All other components within normal limits  CBC - Abnormal; Notable for the following components:   WBC 18.8 (*)    RBC 2.96 (*)    Hemoglobin 8.6 (*)    HCT 26.5 (*)    RDW 17.2 (*)    Platelets 69 (*)    All other components within normal limits  DIFFERENTIAL - Abnormal; Notable for the following components:   Neutro Abs 17.7 (*)    Lymphs Abs 0.4 (*)    Abs Immature Granulocytes 0.28 (*)    All other components within normal limits  COMPREHENSIVE METABOLIC PANEL - Abnormal; Notable for the following components:   Sodium 133 (*)    Potassium 3.1 (*)    CO2 13 (*)    BUN 42 (*)    Creatinine, Ser 3.74 (*)    Calcium 7.4 (*)    Total Protein 5.6 (*)    Albumin 2.0 (*)    GFR calc non Af Amer 17 (*)    GFR calc Af Amer 20 (*)    All other components within normal limits  URINALYSIS, ROUTINE W REFLEX MICROSCOPIC - Abnormal; Notable for the following components:   APPearance CLOUDY (*)    Hgb urine dipstick LARGE (*)    Protein, ur 30 (*)    Leukocytes,Ua LARGE (*)    WBC, UA >50 (*)    Bacteria, UA MANY (*)    All other components within normal limits  DIC (DISSEMINATED INTRAVASCULAR COAGULATION) PANEL - Abnormal; Notable for the following components:   Prothrombin Time 18.8 (*)    INR 1.6 (*)    aPTT 46 (*)    D-Dimer, Quant 3.19 (*)    Platelets 68 (*)    All other components within normal limits  CBG MONITORING, ED - Abnormal; Notable for the following  components:   Glucose-Capillary 64 (*)    All other components within normal limits  I-STAT CHEM 8, ED - Abnormal; Notable for the following components:   Potassium 3.1 (*)    BUN 38 (*)    Creatinine, Ser 4.10 (*)    TCO2 15 (*)    Hemoglobin 9.2 (*)    HCT 27.0 (*)    All other components within normal limits  TROPONIN I (HIGH SENSITIVITY) - Abnormal; Notable for the following components:   Troponin I (High Sensitivity) 308 (*)    All other components within normal limits  TROPONIN I (HIGH SENSITIVITY) - Abnormal; Notable for the following components:   Troponin I (High Sensitivity) 300 (*)    All other components within normal limits  TROPONIN I (HIGH SENSITIVITY) - Abnormal; Notable for the following components:   Troponin I (High Sensitivity) 313 (*)    All other components within normal limits  SARS CORONAVIRUS 2 BY RT PCR (HOSPITAL ORDER, [place] [facility])  CULTURE, BLOOD (ROUTINE X 2)  CULTURE, BLOOD (ROUTINE X 2)  URINE CULTURE  ETHANOL  RAPID URINE DRUG SCREEN, HOSP PERFORMED  LACTATE DEHYDROGENASE  B. BURGDORFI ANTIBODIES  ROCKY MTN SPOTTED FVR ABS PNL(IGG+IGM)  HAPTOGLOBIN  TYPE AND SCREEN  ABO/RH    EKG EKG Interpretation  Date/Time:  Sunday October 17 2019 12:14:51 EDT Ventricular Rate:  61 PR Interval:    QRS Duration: 121 QT Interval:  435 QTC Calculation: 439 R Axis:   65 Text Interpretation: Sinus rhythm Nonspecific intraventricular conduction delay No STEMI Confirmed by Octaviano Glow [phone]) on 10/17/2019 12:36:44 PM   Radiology CT Angio Head W or Wo Contrast  Result Date: 10/17/2019 CLINICAL DATA:  [name] and slurred speech EXAM: CT ANGIOGRAPHY HEAD AND NECK TECHNIQUE: Multidetector CT imaging of the head and neck was performed using the standard protocol during bolus administration of intravenous contrast. Multiplanar CT image reconstructions and MIPs were obtained to evaluate the vascular anatomy. Carotid stenosis  measurements (when applicable) are obtained utilizing NASCET criteria, using the distal internal carotid diameter as the denominator. CONTRAST:  32mL OMNIPAQUE IOHEXOL 350 MG/ML SOLN COMPARISON:  None. FINDINGS: CTA NECK Aortic arch: Great vessel origins are incompletely included. Visualized arch is unremarkable. Right carotid system: Patent. There is no measurable stenosis at the ICA origin. Left carotid system: Common carotid origin is not included. Patent. Trace calcified plaque at the ICA origin without measurable stenosis. Vertebral arteries: Patent. Left vertebral artery is slightly dominant. Skeleton: Evidence of prior anterior fusion at C3-C4 with plate and screw fixation and well incorporated interbody graft. Degenerative changes of the cervical spine. Other neck: No mass or adenopathy. Upper chest: Included upper lungs are clear. Review of the MIP images confirms the above findings CTA HEAD Anterior circulation: Intracranial internal carotid arteries are patent. Anterior cerebral arteries are patent. 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 Middle cerebral arteries are patent. Posterior circulation: Intracranial vertebral arteries, basilar artery, and posterior cerebral arteries are patent. Venous sinuses: Patent as allowed by contrast bolus timing. Review of the MIP images confirms the above findings IMPRESSION: No large vessel occlusion or hemodynamically significant stenosis. Electronically Signed   By: Macy Mis M.D.   On: 10/17/2019 13:15   DG Chest Portable 1 View  Result Date: 10/17/2019 CLINICAL DATA:  Weakness, Reports went to bed last night today awakened and fell to the left sitting up - then fell out of bed In ED makes sffort to raise L arm, but cannot Cannot raise either R or L leg EXAM: PORTABLE CHEST 1 VIEW COMPARISON:  None. FINDINGS: The heart size and mediastinal contours are within normal limits. Low lung volumes. The lungs are clear. No pneumothorax or significant pleural effusion.  No acute finding in the visualized skeleton. Prominent distended loops of bowel in the abdomen. IMPRESSION: No acute cardiopulmonary finding. Electronically Signed   By: Audie Pinto M.D.   On: 10/17/2019 13:59   CT HEAD CODE STROKE WO CONTRAST  Result Date: 10/17/2019 CLINICAL DATA:  Code stroke. Subacute deficit with left-sided weakness. EXAM: CT HEAD WITHOUT CONTRAST TECHNIQUE: Contiguous axial images were obtained from the base of the skull through the vertex without intravenous contrast. COMPARISON:  None. FINDINGS: Brain: No evidence of acute infarction, hemorrhage, hydrocephalus, extra-axial collection or mass lesion/mass effect. Vascular: Negative for hyperdense vessel Skull: Negative Sinuses/Orbits: Mild mucosal edema left maxillary sinus otherwise clear sinuses. Negative orbit. Other: None ASPECTS ([place] Stroke Program Early CT Score) - Ganglionic level infarction (caudate, lentiform nuclei, internal capsule, insula, M1-M3 cortex): 7 - Supraganglionic infarction (M4-M6 cortex): 3 Total score (0-10 with 10 being normal): 10 IMPRESSION: 1. No acute abnormality 2. ASPECTS is 10 3. These results were called by telephone at the time of interpretation on 10/17/2019 at 12:56 pm to provider Sigurd Pugh , who verbally acknowledged these results. Electronically Signed   By: Franchot Gallo M.D.   On: 10/17/2019 12:54   CT ANGIO NECK CODE STROKE  Result Date: 10/17/2019 CLINICAL DATA:  [name] and slurred speech EXAM: CT ANGIOGRAPHY HEAD AND NECK TECHNIQUE: Multidetector CT imaging of the head and neck was performed using the standard protocol during bolus administration of intravenous contrast. Multiplanar  CT image reconstructions and MIPs were obtained to evaluate the vascular anatomy. Carotid stenosis measurements (when applicable) are obtained utilizing NASCET criteria, using the distal internal carotid diameter as the denominator. CONTRAST:  74mL OMNIPAQUE IOHEXOL 350 MG/ML SOLN  COMPARISON:  None. FINDINGS: CTA NECK Aortic arch: Great vessel origins are incompletely included. Visualized arch is unremarkable. Right carotid system: Patent. There is no measurable stenosis at the ICA origin. Left carotid system: Common carotid origin is not included. Patent. Trace calcified plaque at the ICA origin without measurable stenosis. Vertebral arteries: Patent. Left vertebral artery is slightly dominant. Skeleton: Evidence of prior anterior fusion at C3-C4 with plate and screw fixation and well incorporated interbody graft. Degenerative changes of the cervical spine. Other neck: No mass or adenopathy. Upper chest: Included upper lungs are clear. Review of the MIP images confirms the above findings CTA HEAD Anterior circulation: Intracranial internal carotid arteries are patent. Anterior cerebral arteries are patent. 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 Middle cerebral arteries are patent. Posterior circulation: Intracranial vertebral arteries, basilar artery, and posterior cerebral arteries are patent. Venous sinuses: Patent as allowed by contrast bolus timing. Review of the MIP images confirms the above findings IMPRESSION: No large vessel occlusion or hemodynamically significant stenosis. Electronically Signed   By: Macy Mis M.D.   On: 10/17/2019 13:15    Procedures .Critical Care Performed by: Wyvonnia Dusky, MD Authorized by: Wyvonnia Dusky, MD   Critical care provider statement:    Critical care time (minutes):  45   Critical care was necessary to treat or prevent imminent or life-threatening deterioration of the following conditions:  CNS failure or compromise   Critical care was time spent personally by me on the following activities:  Discussions with consultants, evaluation of patient's response to treatment, examination of patient, ordering and performing treatments and interventions, ordering and review of laboratory studies, ordering and review of radiographic  studies, pulse oximetry, re-evaluation of patient's condition, obtaining history from patient or surrogate and review of old charts Comments:     Stroke evaluation with repeat bedside reassessment, discussion with cardiology and neurology   (including critical care time)  Medications Ordered in ED Medications  0.9 %  sodium chloride infusion ( Intravenous New Bag/Given 10/17/19 1615)  iohexol (OMNIPAQUE) 350 MG/ML injection 75 mL (75 mLs Intravenous Contrast Given 10/17/19 1256)  cefTRIAXone (ROCEPHIN) 2 g in sodium chloride 0.9 % 100 mL IVPB (0 g Intravenous Stopped 10/17/19 1611)  sodium chloride 0.9 % bolus 1,000 mL (0 mLs Intravenous Stopped 10/17/19 1611)  potassium chloride SA (KLOR-CON) CR tablet 40 mEq (40 mEq Oral Given 10/17/19 1614)    ED Course  I have reviewed the triage vital signs and the nursing notes.  Pertinent labs & imaging results that were available during my care of the patient were reviewed by me and considered in my medical decision making (see chart for details).  54 yo male here with left sided weakness - appears to be acute on chronic upon further history from family, but here cannot move his left leg at all since this morning, which is a new focal deficit.    Initially I was concerned about LVO with left-sided hemineglect and profound weakness of the left lower extremity.  Stroke alert was called.  CTH and CTA was negative for large occlusion or infarct.  Teleneurologist recommended admission for MRI head to evaluate for thalamic stroke and consider MRI C-spine as well to evaluate for alternative etiology of weakness.  Teleneurologist did not  feel the patient was a candidate for tPA.  The patient's initial troponin was 300 with a nonischemic ECG.  Review [facility] care[facility]where [facility] 06/09[facility]18 mention cardiac cath for NSTEMI with "mild luminal irregularities" and "no culprit lesion."  The patient is chest pain free in the ED.  I consulted with Dr Martinique our  cardiologist as noted below, who felt this was not likely ACS and recommended trending troponins, not to initiate heparin at this time. I agreed with this assessment.  The patient's labs were also notable for a leukocytosis with WBC 18.8, CKD with Cr 3.74 (from record review, Jan level Cr was > 4, so this is likely chronic disease), alb 2.0, hgb 8.6, platelets 69.  Ua showed large leuks + many bacteria, negative nitrites  I also removed a tick that was embedded in the patient's neck.  It's unclear how long it has been present.  He reports pulling other ticks off his body in the past.  Lyme disease may be on the differential.  I ordered 2g IV rocephin for coverage of both lyme disease and likely UTI.  I ordered 1L NS.  I did not feel this presentation was consistent with sepsis.  Finally, regarding his left sided weakness, this may yet be a neurological issue including infarct not visible on initial imaging.  I gave brief consideration to aortic dissection after initial troponin returned elevated, but felt without chest pain, and equal bilateral pulses, this was less likely the cause of his symptoms.  I felt the risk of a secondary contrast via CT were substantial.   He was admitted to the hospitalist Dr Tana Coast pending transfer to [facility] His son was updated by phone and had provided additional history for me.   Clinical Course as of Oct 16 1800  Sun Oct 17, 2019  1239 Stroke alert initiated with concern for LVO given the patient's left-sided hemineglect.  He has no expressive aphasia.  Most concerned that he appears extremely pale on exam with conjunctival pallor, consistent with a possible anemia.  We will check his labs and get a CT scan.  For now his vitals are stable, he is not tachycardic and his blood pressure is 100/68.  He has no chest pain, abdominal pain or back pain to suggest aortic hemorrhage.   [MT]  86 I spoke to teleneurologist who reports patient is not a candidate for tPA, plan to  f/u on CT angio report, will need admission for MRI   [MT]  1332 IMPRESSION: No large vessel occlusion or hemodynamically significant stenosis.   [MT]  1356 Troponin I (High Sensitivity)(!!): 308 [MT]  1401 The patient has absolutely no chest pain or pressure, which significantly lowers my concern for acute dissection.  His extremities are warm and well perfused.  He now reports he can feel me touching his left leg but cannot move it.    [MT]  1429 I spoke with Dr Martinique from cardiology who feels this tropenemia may be demand ischemia in setting of infection and also elevated in setting of CKD, will need to trend this level but no plans for cardiac intervention at this time or need for heparin.  Patient remains chest pain free   [MT]  1438 Spoke to hospitalist who intends to transfer to [facility] of MRI   [MT]    Clinical Course User Index [MT] Anelly Samarin, Carola Rhine, MD    Final Clinical Impression(s) / ED Diagnoses Final diagnoses:  [name]  Chronic kidney disease (CKD),  stage IV (severe) (HCC)  Tick bite, initial encounter  Acute cystitis without hematuria  Elevated troponin    Rx / DC Orders ED Discharge Orders    None       Stephan Draughn, Carola Rhine, MD 10/17/19 [phone]

## 2019-10-17 NOTE — H&P (Signed)
History and Physical        Hospital Admission Note Date: 10/17/2019  Patient name: Chase [name] Medical record number: [id number] Date of birth: 1966/01/03 Age: 54 y.o. Gender: male  PCP: [name]    Patient coming from: Home  I have reviewed all records in the [facility].    Chief Complaint:  Left arm and left leg weakness since this morning  HPI: Patient is a 54 year old male with history of GERD, PTSD, CKD stage IV, history of cervical radiculopathy, chronic pain syndrome, follows pain clinic presented with left arm and left leg weakness since this morning.  Patient reports that he ambulates by himself with the help of a cane.  He last went to bed normal.  However when he woke up this morning, he was unable to move his left side.  He denied any headache, any blurry vision.  He also felt numbness and tingling in his left side.  Denied any prior history of stroke.  Patient reports that he he lives alone and called the EMS.  Denied any chest pain, shortness of breath, abdominal pain, hematochezia or melena. Also reports having dysuria, difficulty urinating, also has an appointment at [facility] for the same  ED work-up/course:   Sodium 133, potassium 3.1, CO2 18, BUN 42, creatinine 3.74, anion gap 9 WBCs 18.8, hemoglobin 8.6, hematocrit 26.5, platelets 69  COVID-19 test negative. UA positive for UTI  Troponin 308 Telemetry neurology was consulted, recommended CT angiogram head to rule out LVOT which was negative.  Recommended MRI and stroke work-up. EDP found tick to posterior neck.   CT head showed no acute abnormality  Review of Systems: Positives marked in 'bold' Constitutional: Denies fever, chills, diaphoresis, poor appetite and fatigue.  HEENT: Denies photophobia, eye pain, redness, hearing loss, ear pain, congestion, sore throat, rhinorrhea,  sneezing, mouth sores, trouble swallowing, neck pain, neck stiffness and tinnitus.   Respiratory: Denies SOB, DOE, cough, chest tightness,  and wheezing.   Cardiovascular: Denies chest pain, palpitations and leg swelling.  Gastrointestinal: Denies nausea, vomiting, abdominal pain, diarrhea, constipation, blood in stool and abdominal distention.  Genitourinary: Please see HPI Musculoskeletal: Denies myalgias, back pain, joint swelling, arthralgias and gait problem.  Skin: Denies pallor, rash and wound.  Neurological: Please see HPI Hematological: Denies adenopathy. Easy bruising, personal or family bleeding history  Psychiatric/Behavioral: Denies suicidal ideation, mood changes, confusion, nervousness, sleep disturbance and agitation  Past Medical History: Past Medical History:  Diagnosis Date  . Chemical exposure    service related injury  . PTSD (post-traumatic stress disorder)   . Renal disorder     Past Surgical History:  Procedure Laterality Date  . GASTRIC BYPASS    . HERNIA REPAIR      Medications: Prior to Admission medications   Medication Sig Start Date End Date Taking? Authorizing Provider  cyanocobalamin (,VITAMIN B-12,) 1000 MCG/ML injection Inject into the muscle. 12/28/13  Yes [provider]  DULoxetine (CYMBALTA) 30 MG capsule Take 30-60 mg by mouth daily. Take 2 capsules in the morning and 1 capsule in the evening 08/12/17  Yes [provider]  gabapentin (NEURONTIN) 800 MG tablet Take 3 tablets  by mouth at bedtime. 09/14/19  Yes [provider]  hydrOXYzine (ATARAX/VISTARIL) 10 MG tablet TAKE 1-2 TABLET(S) BY MOUTH TWICE A DAY AS NEEDED 09/08/18  Yes [provider]  midodrine (PROAMATINE) 2.5 MG tablet Take 1 tablet by mouth 3 (three) times daily. 09/21/18  Yes [provider]  pantoprazole (PROTONIX) 40 MG tablet TAKE ONE TABLET BY MOUTH TWICE A DAY (TAKE ON AN EMPTY STOMACH 30 MINUTES PRIOR TO A MEAL) FOR REFLUX 06/09/18   Yes [provider]  tamsulosin (FLOMAX) 0.4 MG CAPS capsule Take by mouth.   Yes [provider]  traMADol (ULTRAM) 50 MG tablet Take 50 mg by mouth 3 (three) times daily.  09/14/19 11/13/19 Yes [provider]    Allergies:   Allergies  Allergen Reactions  . Nsaids     Other reaction(s): Other Bleeding GI Ulceration    Social History:  reports that he has never smoked. He uses smokeless tobacco. He reports that he does not drink alcohol and does not use drugs.  Family History: No family history on file.  Physical Exam: Blood pressure 108/63, pulse (!) 53, temperature 97.9 F (36.6 C), temperature source Oral, resp. rate 11, height 5\' 8"  (1.727 m), weight 55.3 kg, SpO2 100 %. General: Alert, awake, oriented x3, in no acute distress. Eyes: pink conjunctiva,anicteric sclera, pupils equal and reactive to light and accomodation, HEENT: normocephalic, atraumatic, oropharynx clear Neck: supple, no masses or lymphadenopathy, no goiter, no bruits, no JVD CVS: Regular rate and rhythm, without murmurs, rubs or gallops.  1+ lower extremity edema Resp : Clear to auscultation bilaterally, no wheezing, rales or rhonchi. GI : Soft, nontender, nondistended, positive bowel sounds, no masses. No hepatomegaly. No hernia.  Musculoskeletal: No clubbing or cyanosis, positive pedal pulses.  Neuro: right upper and lower extremity 5/5, left upper extremity 3-4/5, left lower extremity 1/5  Psych: alert and oriented x 3, normal mood and affect Skin: Petechiae on the legs bilaterally   LABS on Admission: I have personally reviewed all the labs and imagings below    Basic Metabolic Panel: Recent Labs  Lab 10/17/19 1241  NA 133*  137  K 3.1*  3.1*  CL 111  108  CO2 13*  GLUCOSE 82  79  BUN 42*  38*  CREATININE 3.74*  4.10*  CALCIUM 7.4*   Liver Function Tests: Recent Labs  Lab 10/17/19 1241  AST 18  ALT 18  ALKPHOS 96  BILITOT 0.4  PROT 5.6*  ALBUMIN 2.0*    No results for input(s): LIPASE, AMYLASE in the last 168 hours. No results for input(s): AMMONIA in the last 168 hours. CBC: Recent Labs  Lab 10/17/19 1241  WBC 18.8*  NEUTROABS 17.7*  HGB 8.6*  9.2*  HCT 26.5*  27.0*  MCV 89.5  PLT 69*   Cardiac Enzymes: No results for input(s): CKTOTAL, CKMB, CKMBINDEX, TROPONINI in the last 168 hours. BNP: Invalid input(s): POCBNP CBG: Recent Labs  Lab 10/17/19 1212  GLUCAP 64*    Radiological Exams on Admission:  CT Angio Head W or Wo Contrast  Result Date: 10/17/2019 CLINICAL DATA:  [name] and slurred speech EXAM: CT ANGIOGRAPHY HEAD AND NECK TECHNIQUE: Multidetector CT imaging of the head and neck was performed using the standard protocol during bolus administration of intravenous contrast. Multiplanar CT image reconstructions and MIPs were obtained to evaluate the vascular anatomy. Carotid stenosis measurements (when applicable) are obtained utilizing NASCET criteria, using the distal internal carotid diameter as the denominator. CONTRAST:  26mL OMNIPAQUE IOHEXOL 350 MG/ML SOLN COMPARISON:  None. FINDINGS: CTA NECK Aortic arch: Great vessel origins are incompletely included. Visualized arch is unremarkable. Right carotid system: Patent. There is no measurable stenosis at the ICA origin. Left carotid system: Common carotid origin is not included. Patent. Trace calcified plaque at the ICA origin without measurable stenosis. Vertebral arteries: Patent. Left vertebral artery is slightly dominant. Skeleton: Evidence of prior anterior fusion at C3-C4 with plate and screw fixation and well incorporated interbody graft. Degenerative changes of the cervical spine. Other neck: No mass or adenopathy. Upper chest: Included upper lungs are clear. Review of the MIP images confirms the above findings CTA HEAD Anterior circulation: Intracranial internal carotid arteries are patent. Anterior cerebral arteries are patent. 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 Middle cerebral arteries are patent. Posterior circulation: Intracranial vertebral arteries, basilar artery, and posterior cerebral arteries are patent. Venous sinuses: Patent as allowed by contrast bolus timing. Review of the MIP images confirms the above findings IMPRESSION: No large vessel occlusion or hemodynamically significant stenosis. Electronically Signed   By: Macy Mis M.D.   On: 10/17/2019 13:15   DG Chest Portable 1 View  Result Date: 10/17/2019 CLINICAL DATA:  Weakness, Reports went to bed last night today awakened and fell to the left sitting up - then fell out of bed In ED makes sffort to raise L arm, but cannot Cannot raise either R or L leg EXAM: PORTABLE CHEST 1 VIEW COMPARISON:  None. FINDINGS: The heart size and mediastinal contours are within normal limits. Low lung volumes. The lungs are clear. No pneumothorax or significant pleural effusion. No acute finding in the visualized skeleton. Prominent distended loops of bowel in the abdomen. IMPRESSION: No acute cardiopulmonary finding. Electronically Signed   By: Audie Pinto M.D.   On: 10/17/2019 13:59   CT HEAD CODE STROKE WO CONTRAST  Result Date: 10/17/2019 CLINICAL DATA:  Code stroke. Subacute deficit with left-sided weakness. EXAM: CT HEAD WITHOUT CONTRAST TECHNIQUE: Contiguous axial images were obtained from the base of the skull through the vertex without intravenous contrast. COMPARISON:  None. FINDINGS: Brain: No evidence of acute infarction, hemorrhage, hydrocephalus, extra-axial collection or mass lesion/mass effect. Vascular: Negative for hyperdense vessel Skull: Negative Sinuses/Orbits: Mild mucosal edema left maxillary sinus otherwise clear sinuses. Negative orbit. Other: None ASPECTS ([place] Stroke Program Early CT Score) - Ganglionic level infarction (caudate, lentiform nuclei, internal capsule, insula, M1-M3 cortex): 7 - Supraganglionic infarction (M4-M6 cortex): 3 Total score (0-10 with 10  being normal): 10 IMPRESSION: 1. No acute abnormality 2. ASPECTS is 10 3. These results were called by telephone at the time of interpretation on 10/17/2019 at 12:56 pm to provider MATTHEW TRIFAN , who verbally acknowledged these results. Electronically Signed   By: Franchot Gallo M.D.   On: 10/17/2019 12:54   CT ANGIO NECK CODE STROKE  Result Date: 10/17/2019 CLINICAL DATA:  [name] and slurred speech EXAM: CT ANGIOGRAPHY HEAD AND NECK TECHNIQUE: Multidetector CT imaging of the head and neck was performed using the standard protocol during bolus administration of intravenous contrast. Multiplanar CT image reconstructions and MIPs were obtained to evaluate the vascular anatomy. Carotid stenosis measurements (when applicable) are obtained utilizing NASCET criteria, using the distal internal carotid diameter as the denominator. CONTRAST:  63mL OMNIPAQUE IOHEXOL 350 MG/ML SOLN COMPARISON:  None. FINDINGS: CTA NECK Aortic arch: Great vessel origins are incompletely included. Visualized arch is unremarkable. Right carotid system: Patent. There is no measurable stenosis at the ICA origin. Left carotid  system: Common carotid origin is not included. Patent. Trace calcified plaque at the ICA origin without measurable stenosis. Vertebral arteries: Patent. Left vertebral artery is slightly dominant. Skeleton: Evidence of prior anterior fusion at C3-C4 with plate and screw fixation and well incorporated interbody graft. Degenerative changes of the cervical spine. Other neck: No mass or adenopathy. Upper chest: Included upper lungs are clear. Review of the MIP images confirms the above findings CTA HEAD Anterior circulation: Intracranial internal carotid arteries are patent. Anterior cerebral arteries are patent. 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 Middle cerebral arteries are patent. Posterior circulation: Intracranial vertebral arteries, basilar artery, and posterior cerebral arteries are patent. Venous  sinuses: Patent as allowed by contrast bolus timing. Review of the MIP images confirms the above findings IMPRESSION: No large vessel occlusion or hemodynamically significant stenosis. Electronically Signed   By: Macy Mis M.D.   On: 10/17/2019 13:15      EKG: Independently reviewed.  Rate 61, normal sinus rhythm, no ischemic changes   Assessment/Plan Principal Problem:   Acute left-sided weakness with concern for acute stroke -Patient presented with weakness in his left side.  Since presentation to ED, patient states left arm weakness is improving however left lower extremity weakness is persisting. -Not a TPA candidate secondary to delayed presentation -CT head showed no acute hemorrhage or acute infarct.  Telemetry neurology was consulted, recommended CT angiogram of the head and neck which showed no LVOT.  Recommended full stroke work-up including MRI brain -Discussed with neurology, Dr. Rory Percy, recommended MRI of the brain.  Will evaluate the patient and if needs MRI of the C-spine, will order -Obtain 2D echocardiogram -Patient has passed bedside evaluation, placed on heart healthy diet -Serial neuro checks, telemetry, obtain hemoglobin A1c, lipid panel -Placed on aspirin 300 mg PR/325 mg p.o. daily, Lipitor 40 mg daily -Placed on IV fluids -PT OT, SLP evaluation  Active Problems:     CKD (chronic kidney disease), stage IV ([place]), with metabolic acidosis -Creatinine 4.7 in 04/2019 reviewed care everywhere records -Presented with creatinine of 3.74, continue gentle hydration - will place on sodium bicarb tabs    Chronic pain syndrome -Patient has history of cervical radiculopathy and has undergone cervical spine surgeries.  Follows pain clinic - will defer to neurology regarding MRI of the C-spine    Falls with generalized debility -Patient also reports having falls at home, lives at home alone, ambulates with a cane -PT OT evaluation, may need STR    Acute lower UTI with  leukocytosis -Patient reported BPH with having dysuria and difficulty urination -UA positive for UTI, obtain a urine culture and sensitivities, placed on IV Rocephin  Elevated troponin with underlying history of CAD -Currently no chest pain or shortness of breath -EKG showed no ischemic changes, rate 61 normal sinus rhythm -EDP discussed with Dr. [place], cardiology on-call, recommended serial troponins and 2D echo  Chronic anemia: Likely anemia of chronic disease - Hb 9.7 in 04/2019 -Hemoglobin currently 8.6, no obvious bleeding, follow counts  Thrombocytopenia -Appears to be new, platelets were 238 in 04/2019, reviewed care everywhere -Patient has petechiae on bilateral lower extremity, was found to have tick on his neck, removed by the EDP.  LFTs normal -Follow PT/INR, reticulocyte count, DIC panel, differential, rule out any hemolysis  Tick bite -engorged tick removed today by EDP from his neck.  Placed on doxycycline  GERD -Continue PPI  Hypokalemia -Replaced  DVT prophylaxis: SCDs due to thrombocytopenia  CODE STATUS: Full code  Consults called: Neurology,  discussed with Dr. Rory Percy  Family Communication: Admission, patients condition and plan of care including tests being ordered have been discussed with the patient who indicates understanding and agree with the plan and Code Status  Admission status: Inpatient, neuro telemetry, transferring to [facility]  The medical decision making on this patient was of high complexity and the patient is at high risk for clinical deterioration, therefore this is a level 3 admission.  Severity of Illness:      The appropriate patient status for this patient is INPATIENT. Inpatient status is judged to be reasonable and necessary in order to provide the required intensity of service to ensure the patient's safety. The patient's presenting symptoms, physical exam findings, and initial radiographic and laboratory data in the context of their  chronic comorbidities is felt to place them at high risk for further clinical deterioration. Furthermore, it is not anticipated that the patient will be medically stable for discharge from the hospital within 2 midnights of admission. The following factors support the patient status of inpatient.   " The patient's presenting symptoms include acute left-sided weakness since morning. " The worrisome physical exam findings include left upper and lower extremity weakness " The initial radiographic and laboratory data are worrisome because of anemia with thrombocytopenia, renal insufficiency hypokalemia, UTI " The chronic co-morbidities include cervical radiculopathy, generalized debility   * I certify that at the point of admission it is my clinical judgment that the patient will require inpatient hospital care spanning beyond 2 midnights from the point of admission due to high intensity of service, high risk for further deterioration and high frequency of surveillance required.*    Time Spent on Admission: 70 minutes     Melek Pownall M.D. [facility] 10/17/2019, 3:17 PM

## 2019-10-17 NOTE — ED Triage Notes (Signed)
Pt reports went to bed around 9 or 10 last night and felt normal.   Reports slept through the night and woke up at 0940 with left sided weakness.  Reports sat up on bed and was leaning to the left and fell out of the bed.  EMS Says pt doesn't think he passed out but isn't sure.  EMS arrived and reports left sided weakness and slurred speech.  Reports speech has cleared but still having left sided weakness.  Pt has indwelling foley cath.  C/O pain to buttocks.  Reports has a pilonidal cyst.

## 2019-10-18 ENCOUNTER — Inpatient Hospital Stay (HOSPITAL_COMMUNITY): Payer: No Typology Code available for payment source

## 2019-10-18 ENCOUNTER — Encounter (HOSPITAL_COMMUNITY): Payer: Self-pay | Admitting: Internal Medicine

## 2019-10-18 DIAGNOSIS — R531 Weakness: Secondary | ICD-10-CM

## 2019-10-18 DIAGNOSIS — I639 Cerebral infarction, unspecified: Secondary | ICD-10-CM

## 2019-10-18 DIAGNOSIS — Z79899 Other long term (current) drug therapy: Secondary | ICD-10-CM

## 2019-10-18 DIAGNOSIS — N390 Urinary tract infection, site not specified: Secondary | ICD-10-CM

## 2019-10-18 DIAGNOSIS — I6340 Cerebral infarction due to embolism of unspecified cerebral artery: Secondary | ICD-10-CM | POA: Insufficient documentation

## 2019-10-18 DIAGNOSIS — C8590 Non-Hodgkin lymphoma, unspecified, unspecified site: Secondary | ICD-10-CM

## 2019-10-18 DIAGNOSIS — F4310 Post-traumatic stress disorder, unspecified: Secondary | ICD-10-CM

## 2019-10-18 DIAGNOSIS — G8194 Hemiplegia, unspecified affecting left nondominant side: Secondary | ICD-10-CM

## 2019-10-18 DIAGNOSIS — D696 Thrombocytopenia, unspecified: Secondary | ICD-10-CM

## 2019-10-18 DIAGNOSIS — G894 Chronic pain syndrome: Secondary | ICD-10-CM

## 2019-10-18 DIAGNOSIS — I63523 Cerebral infarction due to unspecified occlusion or stenosis of bilateral anterior cerebral arteries: Secondary | ICD-10-CM

## 2019-10-18 HISTORY — DX: Cerebral infarction due to embolism of unspecified cerebral artery: I63.40

## 2019-10-18 LAB — LIPID PANEL
Cholesterol: 60 mg/dL (ref 0–200)
HDL: 21 mg/dL — ABNORMAL LOW (ref >40)
LDL Cholesterol: 26 mg/dL (ref 0–99)
Total CHOL/HDL Ratio: 2.9 RATIO
Triglycerides: 67 mg/dL (ref <150)
VLDL: 13 mg/dL (ref 0–40)

## 2019-10-18 LAB — HEMOGLOBIN A1C
Hgb A1c MFr Bld: 5.4 % (ref 4.8–5.6)
Mean Plasma Glucose: 108.28 mg/dL

## 2019-10-18 LAB — CBC WITH DIFFERENTIAL/PLATELET
Abs Immature Granulocytes: 0.08 10*3/uL — ABNORMAL HIGH (ref 0.00–0.07)
Basophils Absolute: 0 10*3/uL (ref 0.0–0.1)
Basophils Relative: 0 %
Eosinophils Absolute: 0.1 10*3/uL (ref 0.0–0.5)
Eosinophils Relative: 0 %
HCT: 25.2 % — ABNORMAL LOW (ref 39.0–52.0)
Hemoglobin: 7.8 g/dL — ABNORMAL LOW (ref 13.0–17.0)
Immature Granulocytes: 1 %
Lymphocytes Relative: 4 %
Lymphs Abs: 0.5 10*3/uL — ABNORMAL LOW (ref 0.7–4.0)
MCH: 27.6 pg (ref 26.0–34.0)
MCHC: 31 g/dL (ref 30.0–36.0)
MCV: 89 fL (ref 80.0–100.0)
Monocytes Absolute: 0.3 10*3/uL (ref 0.1–1.0)
Monocytes Relative: 2 %
Neutro Abs: 10.9 10*3/uL — ABNORMAL HIGH (ref 1.7–7.7)
Neutrophils Relative %: 93 %
Platelets: 61 10*3/uL — ABNORMAL LOW (ref 150–400)
RBC: 2.83 MIL/uL — ABNORMAL LOW (ref 4.22–5.81)
RDW: 17.3 % — ABNORMAL HIGH (ref 11.5–15.5)
WBC: 11.7 10*3/uL — ABNORMAL HIGH (ref 4.0–10.5)
nRBC: 0 % (ref 0.0–0.2)

## 2019-10-18 LAB — HIV ANTIBODY (ROUTINE TESTING W REFLEX): HIV Screen 4th Generation wRfx: NONREACTIVE

## 2019-10-18 LAB — RENAL FUNCTION PANEL
Albumin: 1.6 g/dL — ABNORMAL LOW (ref 3.5–5.0)
Anion gap: 6 (ref 5–15)
BUN: 41 mg/dL — ABNORMAL HIGH (ref 6–20)
CO2: 14 mmol/L — ABNORMAL LOW (ref 22–32)
Calcium: 7.4 mg/dL — ABNORMAL LOW (ref 8.9–10.3)
Chloride: 115 mmol/L — ABNORMAL HIGH (ref 98–111)
Creatinine, Ser: 3.52 mg/dL — ABNORMAL HIGH (ref 0.61–1.24)
GFR calc Af Amer: 21 mL/min — ABNORMAL LOW (ref >60)
GFR calc non Af Amer: 18 mL/min — ABNORMAL LOW (ref >60)
Glucose, Bld: 88 mg/dL (ref 70–99)
Phosphorus: 5.1 mg/dL — ABNORMAL HIGH (ref 2.5–4.6)
Potassium: 3.5 mmol/L (ref 3.5–5.1)
Sodium: 135 mmol/L (ref 135–145)

## 2019-10-18 LAB — HAPTOGLOBIN: Haptoglobin: 130 mg/dL (ref 29–370)

## 2019-10-18 LAB — RETICULOCYTES
Immature Retic Fract: 14.9 % (ref 2.3–15.9)
RBC.: 2.5 MIL/uL — ABNORMAL LOW (ref 4.22–5.81)
Retic Count, Absolute: 32.8 10*3/uL (ref 19.0–186.0)
Retic Ct Pct: 1.3 % (ref 0.4–3.1)

## 2019-10-18 LAB — DIRECT ANTIGLOBULIN TEST (NOT AT ARMC)
DAT, IgG: NEGATIVE
DAT, complement: NEGATIVE

## 2019-10-18 MED ORDER — SODIUM CHLORIDE 0.9 % IV SOLN
100.0000 mg | Freq: Two times a day (BID) | INTRAVENOUS | Status: DC
  Administered 2019-10-18 – 2019-10-22 (×8): 100 mg via INTRAVENOUS
  Filled 2019-10-18 (×9): qty 100

## 2019-10-18 MED ORDER — ASPIRIN EC 81 MG PO TBEC
81.0000 mg | DELAYED_RELEASE_TABLET | Freq: Every day | ORAL | Status: DC
  Administered 2019-10-18 – 2019-10-26 (×9): 81 mg via ORAL
  Filled 2019-10-18 (×9): qty 1

## 2019-10-18 MED ORDER — PNEUMOCOCCAL VAC POLYVALENT 25 MCG/0.5ML IJ INJ
0.5000 mL | INJECTION | INTRAMUSCULAR | Status: DC
  Filled 2019-10-18: qty 0.5

## 2019-10-18 MED ORDER — SODIUM CHLORIDE 0.9 % IV SOLN
2.0000 g | INTRAVENOUS | Status: AC
  Administered 2019-10-18 – 2019-10-24 (×7): 2 g via INTRAVENOUS
  Filled 2019-10-18 (×6): qty 20
  Filled 2019-10-18 (×2): qty 2

## 2019-10-18 MED ORDER — CHLORHEXIDINE GLUCONATE CLOTH 2 % EX PADS
6.0000 | MEDICATED_PAD | Freq: Every day | CUTANEOUS | Status: DC
  Administered 2019-10-18 – 2019-10-26 (×8): 6 via TOPICAL

## 2019-10-18 NOTE — Procedures (Signed)
Echo attempted. Patient eating breakfast and requested that I attempt echo later. Will attempt again later.

## 2019-10-18 NOTE — Evaluation (Signed)
Physical Therapy Evaluation Patient Details Name: Chase [name] MRN: [id number] DOB: Feb 10, 1966 Today's Date: 10/18/2019   History of Present Illness  Pt is a 54 y.o. male with PMH of cervical post-laminectomy syndrome, arthalgia of multiple sites, hodgkin's disease, PTSD, CKD, chronic pain syndrome, and anemia presenting to ED with L sided weakness, slurred speech, and fall. 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  Clinical Impression  Pt admitted with above diagnosis. At the time of PT eval pt was able to perform transfers with up to +2 mod assist for balance support and safety. Pt somewhat self-limiting at times, but was able to transition to the recliner chair while demonstrating weight bearing through the LLE/LUE for support. Pt clear that he does not want to have assistance when he returns home however seems to understand that he is in need of further therapies before he would be safe at home alone. Pt agreeable to CIR level therapies but reports he does not want to go to SNF as his father passed away while at SNF for rehab.  Pt currently with functional limitations due to the deficits listed below (see PT Problem List). Pt will benefit from skilled PT to increase their independence and safety with mobility to allow discharge to the venue listed below.       Follow Up Recommendations CIR    Equipment Recommendations  None recommended by PT (TBD by next venue of care)    Recommendations for Other Services Rehab consult     Precautions / Restrictions Precautions Precautions: Fall Restrictions Weight Bearing Restrictions: No      Mobility  Bed Mobility Overal bed mobility: Needs Assistance Bed Mobility: Supine to Sit     Supine to sit: Max assist;+2 for physical assistance     General bed mobility comments: To elevated trunk and advance lower legs off EOB  Transfers Overall transfer level: Needs assistance Equipment used: 2 person hand held  assist Transfers: Sit to/from [facility] Sit to Stand: Mod assist;+2 physical assistance Stand pivot transfers: Mod assist;+2 physical assistance       General transfer comment: Mod A due to decreased movement and strength on L side  Ambulation/Gait             General Gait Details: Unable to progress to gait training this session  Stairs            Wheelchair Mobility    Modified Rankin (Stroke Patients Only)       Balance Overall balance assessment: Needs assistance Sitting-balance support: Bilateral upper extremity supported;Feet supported Sitting balance-Leahy Scale: Poor Sitting balance - Comments: sitting EOB with poor balance with posterior and right lean Mod-min guard to correct  Postural control: Posterior lean;Right lateral lean Standing balance support: Bilateral upper extremity supported;During functional activity Standing balance-Leahy Scale: Zero Standing balance comment: pt reliant on support for balance                             Pertinent Vitals/Pain Pain Assessment: Faces Faces Pain Scale: Hurts even more Pain Location: ulcer on sacrum Pain Descriptors / Indicators: Discomfort;Grimacing;Guarding;Tender Pain Intervention(s): Limited activity within patient's tolerance;Monitored during session;Repositioned    Home Living Family/patient expects to be discharged to:: Private residence Living Arrangements: Alone Available Help at Discharge: Family;Friend(s);Available PRN/intermittently Type of Home: House Home Access: Stairs to enter Entrance Stairs-Rails: None Entrance Stairs-Number of Steps: 5 Home Layout: One level Home Equipment: Shower seat - built  in;Cane - single point      Prior Function Level of Independence: Independent with assistive device(s)         Comments: Pt reports not needing help with ADLs at home     Hand Dominance   Dominant Hand: Right    Extremity/Trunk Assessment   Upper  Extremity Assessment Upper Extremity Assessment: Defer to OT evaluation LUE Deficits / Details: Pt with 2+/5 wrist flex/ext, 2/5 elbow flexion, and 2/5 shoulder flexion.  LUE Coordination: decreased fine motor;decreased gross motor    Lower Extremity Assessment Lower Extremity Assessment: RLE deficits/detail RLE Deficits / Details: Decreased strength consistent with stroke RLE Coordination: decreased fine motor;decreased gross motor    Cervical / Trunk Assessment Cervical / Trunk Assessment: Other exceptions (Simultaneous filing. User may not have seen previous data.) Cervical / Trunk Exceptions: Forward head posture with rounded shoulders  Communication   Communication: No difficulties  Cognition Arousal/Alertness: Lethargic Behavior During Therapy: Flat affect Overall Cognitive Status: No family/caregiver present to determine baseline cognitive functioning Area of Impairment: Problem solving;Awareness;Following commands;Attention                   Current Attention Level: Selective   Following Commands: Follows one step commands consistently;Follows one step commands with increased time   Awareness: Emergent Problem Solving: Slow processing;Decreased initiation;Difficulty sequencing;Requires verbal cues;Requires tactile cues General Comments: Pt A&Ox4      General Comments General comments (skin integrity, edema, etc.): Pt with scratches on hands and LE's and stated he had taken many falls recently    Exercises     Assessment/Plan    PT Assessment Patient needs continued PT services  PT Problem List Decreased strength;Decreased activity tolerance;Decreased balance;Decreased mobility;Decreased coordination;Decreased cognition;Decreased knowledge of use of DME;Decreased safety awareness;Decreased knowledge of precautions;Pain       PT Treatment Interventions DME instruction;Gait training;Functional mobility training;Therapeutic activities;Stair training;Therapeutic  exercise;Balance training;Neuromuscular re-education;Patient/family education    PT Goals (Current goals can be found in the Care Plan section)  Acute Rehab PT Goals Patient Stated Goal: get better PT Goal Formulation: With patient Time For Goal Achievement: 11/01/19 Potential to Achieve Goals: Good    Frequency Min 4X/week   Barriers to discharge        Co-evaluation PT/OT/SLP Co-Evaluation/Treatment: Yes Reason for Co-Treatment: Complexity of the patient's impairments (multi-system involvement) PT goals addressed during session: Mobility/safety with mobility;Balance OT goals addressed during session: ADL's and self-care;Strengthening/ROM       AM-PAC PT "6 Clicks" Mobility  Outcome Measure Help needed turning from your back to your side while in a flat bed without using bedrails?: A Little Help needed moving from lying on your back to sitting on the side of a flat bed without using bedrails?: A Lot Help needed moving to and from a bed to a chair (including a wheelchair)?: Total Help needed standing up from a chair using your arms (e.g., wheelchair or bedside chair)?: A Lot Help needed to walk in hospital room?: Total Help needed climbing 3-5 steps with a railing? : Total 6 Click Score: 10    End of Session Equipment Utilized During Treatment: Gait belt Activity Tolerance: Patient limited by fatigue Patient left: in chair;with call bell/phone within reach;with chair alarm set Nurse Communication: Mobility status PT Visit Diagnosis: Unsteadiness on feet (R26.81);Hemiplegia and hemiparesis Hemiplegia - Right/Left: Left Hemiplegia - dominant/non-dominant: Non-dominant Hemiplegia - caused by: Cerebral infarction    Time: 7124-5809 PT Time Calculation (min) (ACUTE ONLY): 38 min   Charges:   PT Evaluation $PT  Eval Moderate Complexity: 1 Mod          Rolinda Roan, PT, DPT Acute Rehabilitation Services Pager: [phone] Office: [phone]   Thelma Comp 10/18/2019, 3:12 PM

## 2019-10-18 NOTE — Evaluation (Signed)
Occupational Therapy Evaluation Patient Details Name: Chase [name] MRN: [id number] DOB: September 07, 1965 Today's Date: 10/18/2019    History of Present Illness Pt is a 54 y.o. male with PMH of cervical post-laminectomy syndrome, arthalgia of multiple sites, hodgkin's disease, PTSD, CKD, chronic pain syndrome, and anemia presenting to ED with L sided weakness, slurred speech, and fall. Removed engorged tick from neck on 7/18. 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   Clinical Impression   PTA pt reports being modified independent with assistive devices. .Pt was admitted for above and treated for problem list below (see OT Problem List). .Pt is A&Ox4 but presents with slow processing and decreased attention at start of eval with being worried about call from PCP. Pt became more alert with sitting EOB from lethargy and fatigue laying in bed. Pt with poor awareness stating he did not want help when he went home upon dc. Requires set up to total A +2 for ADLs with multimodal cues for sequencing and initiation due to decreased strength and ROM on the L side. Requires Mod A +2 with transfers and Max A +2 for bed mobility due to decreased balance, weakness, and reliance on B UE support in standing. Believe pt would benefit from skilled OT services acutely and at the CIR level to increase safety and independence with ADLs.      Follow Up Recommendations  CIR    Equipment Recommendations   (TBD at next venue of care)    Recommendations for Other Services Rehab consult     Precautions / Restrictions Precautions Precautions: Fall      Mobility Bed Mobility Overal bed mobility: Needs Assistance Bed Mobility: Supine to Sit     Supine to sit: Max assist;+2 for physical assistance     General bed mobility comments: To elevated trunk and lower legs  Transfers Overall transfer level: Needs assistance Equipment used: 2 person hand  held assist Transfers: Sit to/from [facility] Sit to Stand: Mod assist;+2 physical assistance Stand pivot transfers: Mod assist;+2 physical assistance       General transfer comment: Mod A due to decreased movement and strength on L side    Balance Overall balance assessment: Needs assistance Sitting-balance support: Bilateral upper extremity supported;Feet supported Sitting balance-Leahy Scale: Poor Sitting balance - Comments: sitting EOB with poor balance with posterior and right lean Mod-min guard to correct  Postural control: Posterior lean;Right lateral lean Standing balance support: Bilateral upper extremity supported;During functional activity Standing balance-Leahy Scale: Poor Standing balance comment: pt reliant on support for balance                           ADL either performed or assessed with clinical judgement   ADL Overall ADL's : Needs assistance/impaired     Grooming: Wash/dry face;Set up;Sitting Grooming Details (indicate cue type and reason): pt able to wash/dry face in sitting with set up Upper Body Bathing: Maximal assistance;Sitting Upper Body Bathing Details (indicate cue type and reason): Max A for sitting balance and decreased strength/ROM in L arm Lower Body Bathing: Maximal assistance;Sitting/lateral leans Lower Body Bathing Details (indicate cue type and reason): Max due to decreased sitting balance and unable to bend over to reach lower legs Upper Body Dressing : Maximal assistance;Sitting Upper Body Dressing Details (indicate cue type and reason): Max A in sitting for decreased balance and strength Lower Body Dressing: Total assistance;+2 for safety/equipment Lower Body Dressing  Details (indicate cue type and reason): for reliance on BUE support Toilet Transfer: Stand-pivot;BSC;Moderate assistance;+2 for physical assistance Toilet Transfer Details (indicate cue type and reason): simulated to recliner Toileting- Clothing  Manipulation and Hygiene: Total assistance;+2 for physical assistance;Sit to/from stand Toileting - Clothing Manipulation Details (indicate cue type and reason): for reliance on BUE support   Tub/Shower Transfer Details (indicate cue type and reason): deferred for pt safety Functional mobility during ADLs: Moderate assistance;+2 for physical assistance General ADL Comments: Pt with decreased balance, weakness, and coordination on L-side     Vision Baseline Vision/History: No visual deficits Vision Assessment?: Vision impaired- to be further tested in functional context Additional Comments: Noted R sided gaze preference and L visual field deficits.            Pertinent Vitals/Pain Pain Assessment: Faces Faces Pain Scale: Hurts whole lot Pain Location: ulcer on sacrum Pain Descriptors / Indicators: Discomfort;Grimacing;Guarding;Tender Pain Intervention(s): Monitored during session;Repositioned     Hand Dominance Right   Extremity/Trunk Assessment Upper Extremity Assessment Upper Extremity Assessment: LUE deficits/detail LUE Deficits / Details: Pt with 2+/5 wrist flex/ext, 2/5 elbow flexion, and 2/5 shoulder flexion.  LUE Coordination: decreased fine motor;decreased gross motor   Lower Extremity Assessment Lower Extremity Assessment: Defer to PT evaluation   Cervical / Trunk Assessment Cervical / Trunk Assessment: Normal   Communication Communication Communication: No difficulties   Cognition Arousal/Alertness: Lethargic Behavior During Therapy: Flat affect Overall Cognitive Status: No family/caregiver present to determine baseline cognitive functioning Area of Impairment: Problem solving;Awareness;Following commands;Attention  Current Attention Level: Selective Following Commands: Follows one step commands inconsistently;Follows one step commands with increased time Awareness: Emergent Problem Solving: Slow processing;Decreased initiation;Difficulty sequencing;Requires  verbal cues;Requires tactile cues                             General Comments: Pt A&Ox4. Slow processing and decreased attention at start of eval with being worried about call from PCP. Pt required multimodal cues for sequencing and initiation with moving. Pt became more alert with sitting EOB from lethargy and fatigue laying in bed. Pt with poor awareness stating he did not want help when he went home upon dc.   General Comments  Pt with scratches on hands and LE's and stated he had taken many falls recently            Home Living Family/patient expects to be discharged to:: Private residence Living Arrangements: Alone Available Help at Discharge: Family;Friend(s);Available PRN/intermittently Type of Home: House Home Access: Stairs to enter [employer] of Steps: 5 Entrance Stairs-Rails: None Home Layout: One level     Bathroom Shower/Tub: Occupational psychologist: Handicapped height     Home Equipment: Shower seat - built in;Cane - single point          Prior Functioning/Environment Level of Independence: Independent with assistive device(s)        Comments: Pt reports not needing help with ADLs at home        OT Problem List: Decreased strength;Decreased range of motion;Impaired balance (sitting and/or standing);Impaired vision/perception;Decreased coordination;Decreased safety awareness;Decreased knowledge of use of DME or AE;Decreased knowledge of precautions;Impaired UE functional use      OT Treatment/Interventions: Self-care/ADL training;Neuromuscular education;Energy conservation;DME and/or AE instruction;Manual therapy;Therapeutic activities;Visual/perceptual remediation/compensation;Patient/family education;Balance training    OT Goals(Current goals can be found in the care plan section) Acute Rehab OT Goals Patient Stated Goal: get better OT Goal Formulation: With patient Time For  Goal Achievement: 11/01/19 Potential to  Achieve Goals: Good  OT Frequency: Min 2X/week   Barriers to D/C:            Co-evaluation PT/OT/SLP Co-Evaluation/Treatment: Yes Reason for Co-Treatment: Complexity of the patient's impairments (multi-system involvement);For patient/therapist safety;To address functional/ADL transfers   OT goals addressed during session: ADL's and self-care;Strengthening/ROM      AM-PAC OT "6 Clicks" Daily Activity     Outcome Measure Help from another person eating meals?: A Little Help from another person taking care of personal grooming?: A Little Help from another person toileting, which includes using toliet, bedpan, or urinal?: Total Help from another person bathing (including washing, rinsing, drying)?: A Lot Help from another person to put on and taking off regular upper body clothing?: A Lot Help from another person to put on and taking off regular lower body clothing?: Total 6 Click Score: 12   End of Session Equipment Utilized During Treatment: Gait belt Nurse Communication: Mobility status  Activity Tolerance: Patient tolerated treatment well Patient left: in chair;with chair alarm set;with call bell/phone within reach  OT Visit Diagnosis: Unsteadiness on feet (R26.81);Other abnormalities of gait and mobility (R26.89);Muscle weakness (generalized) (M62.81);Repeated falls (R29.6);History of falling (Z91.81);Low vision, both eyes (H54.2);Other symptoms and signs involving the nervous system (R29.898);Hemiplegia and hemiparesis Hemiplegia - Right/Left: Left Hemiplegia - dominant/non-dominant: Non-Dominant Hemiplegia - caused by: Cerebral infarction                Time: 1052-1130 OT Time Calculation (min): 38 min Charges:  OT General Charges $OT Visit: 1 Visit OT Evaluation $OT Eval Moderate Complexity: 1 Mod OT Treatments $Self Care/Home Management : 8-22 mins  Seger Jani/OTS   Shanequa Whitenight 10/18/2019, 1:25 PM

## 2019-10-18 NOTE — Consult Note (Signed)
NEURO HOSPITALIST CONSULT NOTE   Requestig physician: Dr. Loleta Books  Reason for Consult: Left sided weakness  History obtained from:  Patient and Chart     HPI:                                                                                                                                          Chase [name] is an 54 y.o. male who presented to the [facility] ED on Sunday afternoon with a chief complaint of left sided weakness. He was last normal at 9 or 10 PM on Saturday night. On awakening Sunday morning at 0940, he tried to sit up in his bed, was leaning to the left and then fell out of bed. He notes that his LLE gave out on him. He then called EMS. On arrival, EMS noted slurred speech in conjunction with left sided weakness. On assessment by the ED physician at [facility], the patient also endorsed sensory loss involving his left arm and leg. At baseline he is fully independent and is ambulatory. He is followed as an outpatient at the [facility] in [place].   The patient's son was contacted by the EDP at [facility]. Per notes, the "son clarifies that his father has been "in and out of the hospital nonstop for the past 3 years," and says he left the hospital about 1.5 months ago with a new indwelling foley catheter at that time due to a bladder mass causing outlet obstruction.  He has not had that foley changed over.  His son tells me the patient normally has some left sided leg weakness at baseline and has been "falling a lot" but has been able to ambulate with a cane.  His son tells me his father has chronic kidney disease.  He feels his father has not been caring for himself.  His son does not live with his father."  Code Stroke was called at [facility]. CT head was negative for acute infarction or hemorrhage. CTA was negative for LVO. Teleneurology was consulted and recommended an MRI brain to evaluate for a thalamic stroke. Also recommended was to consider an MRI C-spine. The patient was out of the tPA  time window.   Labs at [facility] were notable for a leukocytosis with WBC 18.8, CKD with Cr 3.74 (from record review, Jan level Cr was > 4, so this is likely chronic disease), alb 2.0, hgb 8.6, platelets 69. U/A showed large leuks + many bacteria, negative nitrites. Of note, a tick that was embedded in the patient's neck was removed by the EDP. Lyme disease was felt to be on the DDx. Rocephin IV was started.   The patient was subsequently transferred to [facility] for further evaluation.   Past Medical History:  Diagnosis Date   Chemical exposure  service related injury   PTSD (post-traumatic stress disorder)    Renal disorder     Past Surgical History:  Procedure Laterality Date   GASTRIC BYPASS     HERNIA REPAIR      No family history on file.            Social History:  reports that he has never smoked. He uses smokeless tobacco. He reports that he does not drink alcohol and does not use drugs.  Allergies  Allergen Reactions   Nsaids     Other reaction(s): Other Bleeding GI Ulceration    MEDICATIONS:                                                                                                                     Prior to Admission:  Medications Prior to Admission  Medication Sig Dispense Refill Last Dose   cyanocobalamin (,VITAMIN B-12,) 1000 MCG/ML injection Inject 1,000 mcg into the muscle every 30 (thirty) days.    Past Month at Unknown time   DULoxetine (CYMBALTA) 30 MG capsule Take 30-60 mg by mouth See admin instructions. Take 2 capsules in the morning and 1 capsule in the evening   10/16/2019 at Unknown time   gabapentin (NEURONTIN) 800 MG tablet Take 3 tablets by mouth at bedtime.   10/16/2019 at Unknown time   hydrOXYzine (ATARAX/VISTARIL) 10 MG tablet Take 10-20 mg by mouth daily as needed for anxiety.    10/16/2019 at Unknown time   midodrine (PROAMATINE) 2.5 MG tablet Take 1 tablet by mouth 3 (three) times daily.   10/16/2019 at Unknown time   pantoprazole  (PROTONIX) 40 MG tablet Take 40 mg by mouth 2 (two) times daily.    10/16/2019 at Unknown time   tamsulosin (FLOMAX) 0.4 MG CAPS capsule Take 0.4 mg by mouth daily.    10/16/2019 at Unknown time   traMADol (ULTRAM) 50 MG tablet Take 50 mg by mouth 3 (three) times daily.    10/16/2019 at Unknown time   Scheduled:  aspirin  300 mg Rectal Daily   Or   aspirin  325 mg Oral Daily   atorvastatin  40 mg Oral Daily   Chlorhexidine Gluconate Cloth  6 each Topical Daily   doxycycline  100 mg Oral Q12H   DULoxetine  30 mg Oral QHS   DULoxetine  60 mg Oral Daily   gabapentin  300 mg Oral BID   midodrine  2.5 mg Oral TID   pantoprazole  40 mg Oral BID AC   [START ON 10/19/2019] pneumococcal 23 valent vaccine  0.5 mL Intramuscular Tomorrow-1000   sodium bicarbonate  325 mg Oral TID   tamsulosin  0.4 mg Oral Daily   traMADol  50 mg Oral Q12H   Continuous:  sodium chloride 75 mL/hr at 10/18/19 0547     ROS:  As per HPI. Does not endorse any additional symptoms except for diarrhea and several episodes of intermittent vision loss involving his right eye in the past.    Blood pressure 101/67, pulse 63, temperature 97.7 F (36.5 C), temperature source Oral, resp. rate 20, height 5\' 8"  (1.727 m), weight 55.3 kg, SpO2 100 %.   General Examination:                                                                                                       Physical Exam  HEENT-  [place]/AT    Lungs- Respirations unlabored Extremities- No edema. Abrasions and scratches noted.   Neurological Examination Mental Status: Awake and alert. Speech fluent without evidence of aphasia.  Able to follow all commands without difficulty. Cranial Nerves: II: Visual fields intact. No extinction to DSS. PERRL.   III,IV, VI: No ptosis. EOMI. Saccadic quality of visual pursuits is  noted.  V,VII: Smile symmetric, facial temp sensation decreased on the left VIII: hearing intact to voice IX,X: No hypophonia XI: Head is midline XII: midline tongue extension Motor: RUE and RLE 5/5 LUE 2-3/5.  LLE 1-2/5 proximally and distally  Sensory: Decreased temp and FT sensation to LUE. Decreased FT sensation to LLE, but with hyperesthesia to temp.  Deep Tendon Reflexes:  2+ right biceps and brachioradialis 1+ left biceps and brachioradialis 4+ bilateral patellar reflexes (crossed adductors) with lower amplitude response on the right.  Plantars: Right: Downgoing   Left: Upgoing Cerebellar: No ataxia with FNF on the right. Unable to perform on the left.  Gait: Deferred   Lab Results: Basic Metabolic Panel: Recent Labs  Lab 10/17/19 1241 10/18/19 0013  NA 133*   137 135  K 3.1*   3.1* 3.5  CL 111   108 115*  CO2 13* 14*  GLUCOSE 82   79 88  BUN 42*   38* 41*  CREATININE 3.74*   4.10* 3.52*  CALCIUM 7.4* 7.4*  PHOS  --  5.1*    CBC: Recent Labs  Lab 10/17/19 1241 10/17/19 1311 10/18/19 0013  WBC 18.8*  --  11.7*  NEUTROABS 17.7*  --  10.9*  HGB 8.6*   9.2*  --  7.8*  HCT 26.5*   27.0*  --  25.2*  MCV 89.5  --  89.0  PLT 69* 68* 61*    Cardiac Enzymes: No results for input(s): CKTOTAL, CKMB, CKMBINDEX, TROPONINI in the last 168 hours.  Lipid Panel: Recent Labs  Lab 10/18/19 0013  CHOL 60  TRIG 67  HDL 21*  CHOLHDL 2.9  VLDL 13  LDLCALC 26    Imaging: CT Angio Head W or Wo Contrast  Result Date: 10/17/2019 CLINICAL DATA:  [name] and slurred speech EXAM: CT ANGIOGRAPHY HEAD AND NECK TECHNIQUE: Multidetector CT imaging of the head and neck was performed using the standard protocol during bolus administration of intravenous contrast. Multiplanar CT image reconstructions and MIPs were obtained to evaluate the vascular anatomy. Carotid stenosis measurements (when applicable) are obtained utilizing NASCET criteria, using the distal  internal carotid diameter as the  denominator. CONTRAST:  57mL OMNIPAQUE IOHEXOL 350 MG/ML SOLN COMPARISON:  None. FINDINGS: CTA NECK Aortic arch: Great vessel origins are incompletely included. Visualized arch is unremarkable. Right carotid system: Patent. There is no measurable stenosis at the ICA origin. Left carotid system: Common carotid origin is not included. Patent. Trace calcified plaque at the ICA origin without measurable stenosis. Vertebral arteries: Patent. Left vertebral artery is slightly dominant. Skeleton: Evidence of prior anterior fusion at C3-C4 with plate and screw fixation and well incorporated interbody graft. Degenerative changes of the cervical spine. Other neck: No mass or adenopathy. Upper chest: Included upper lungs are clear. Review of the MIP images confirms the above findings CTA HEAD Anterior circulation: Intracranial internal carotid arteries are patent. Anterior cerebral arteries are patent. 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 Middle cerebral arteries are patent. Posterior circulation: Intracranial vertebral arteries, basilar artery, and posterior cerebral arteries are patent. Venous sinuses: Patent as allowed by contrast bolus timing. Review of the MIP images confirms the above findings IMPRESSION: No large vessel occlusion or hemodynamically significant stenosis. Electronically Signed   By: Macy Mis M.D.   On: 10/17/2019 13:15   DG Chest Portable 1 View  Result Date: 10/17/2019 CLINICAL DATA:  Weakness, Reports went to bed last night today awakened and fell to the left sitting up - then fell out of bed In ED makes sffort to raise L arm, but cannot Cannot raise either R or L leg EXAM: PORTABLE CHEST 1 VIEW COMPARISON:  None. FINDINGS: The heart size and mediastinal contours are within normal limits. Low lung volumes. The lungs are clear. No pneumothorax or significant pleural effusion. No acute finding in the visualized skeleton. Prominent distended loops of bowel in the  abdomen. IMPRESSION: No acute cardiopulmonary finding. Electronically Signed   By: Audie Pinto M.D.   On: 10/17/2019 13:59   CT HEAD CODE STROKE WO CONTRAST  Result Date: 10/17/2019 CLINICAL DATA:  Code stroke. Subacute deficit with left-sided weakness. EXAM: CT HEAD WITHOUT CONTRAST TECHNIQUE: Contiguous axial images were obtained from the base of the skull through the vertex without intravenous contrast. COMPARISON:  None. FINDINGS: Brain: No evidence of acute infarction, hemorrhage, hydrocephalus, extra-axial collection or mass lesion/mass effect. Vascular: Negative for hyperdense vessel Skull: Negative Sinuses/Orbits: Mild mucosal edema left maxillary sinus otherwise clear sinuses. Negative orbit. Other: None ASPECTS ([place] Stroke Program Early CT Score) - Ganglionic level infarction (caudate, lentiform nuclei, internal capsule, insula, M1-M3 cortex): 7 - Supraganglionic infarction (M4-M6 cortex): 3 Total score (0-10 with 10 being normal): 10 IMPRESSION: 1. No acute abnormality 2. ASPECTS is 10 3. These results were called by telephone at the time of interpretation on 10/17/2019 at 12:56 pm to provider MATTHEW TRIFAN , who verbally acknowledged these results. Electronically Signed   By: Franchot Gallo M.D.   On: 10/17/2019 12:54   CT ANGIO NECK CODE STROKE  Result Date: 10/17/2019 CLINICAL DATA:  [name] and slurred speech EXAM: CT ANGIOGRAPHY HEAD AND NECK TECHNIQUE: Multidetector CT imaging of the head and neck was performed using the standard protocol during bolus administration of intravenous contrast. Multiplanar CT image reconstructions and MIPs were obtained to evaluate the vascular anatomy. Carotid stenosis measurements (when applicable) are obtained utilizing NASCET criteria, using the distal internal carotid diameter as the denominator. CONTRAST:  58mL OMNIPAQUE IOHEXOL 350 MG/ML SOLN COMPARISON:  None. FINDINGS: CTA NECK Aortic arch: Great vessel origins are incompletely  included. Visualized arch is unremarkable. Right carotid system: Patent. There is no measurable stenosis at the ICA  origin. Left carotid system: Common carotid origin is not included. Patent. Trace calcified plaque at the ICA origin without measurable stenosis. Vertebral arteries: Patent. Left vertebral artery is slightly dominant. Skeleton: Evidence of prior anterior fusion at C3-C4 with plate and screw fixation and well incorporated interbody graft. Degenerative changes of the cervical spine. Other neck: No mass or adenopathy. Upper chest: Included upper lungs are clear. Review of the MIP images confirms the above findings CTA HEAD Anterior circulation: Intracranial internal carotid arteries are patent. Anterior cerebral arteries are patent. 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 Middle cerebral arteries are patent. Posterior circulation: Intracranial vertebral arteries, basilar artery, and posterior cerebral arteries are patent. Venous sinuses: Patent as allowed by contrast bolus timing. Review of the MIP images confirms the above findings IMPRESSION: No large vessel occlusion or hemodynamically significant stenosis. Electronically Signed   By: Macy Mis M.D.   On: 10/17/2019 13:15    Assessment: 54 year old male with left sided weakness  1. Exam reveals severe weakness as well as sensory loss involving the LUE and LLE.  2. MRI has been completed with official report pending. 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 A cardioembolic etiology is suspected.  3. CTA of head and neck at OSH: No LVO  Recommendations: 1. TTE. If negative, obtain TEE.  2. Cardiac telemetry 3. Agree with starting ASA for now. May need to be started on anticoagulation pending results of the work-up. 4. Agree with starting atorvastatin.  5. BP management. Out of the permissive HTN time window.  6. PT/OT/Speech.     Electronically signed: Dr.  Kerney Elbe  10/18/2019, 7:04 AM

## 2019-10-18 NOTE — Code Documentation (Signed)
Stroke Response Nurse Documentation Code Documentation  Chase [name] is a 54 y.o. male is a patient currently admitted to [facility] for left sided weakness on Sunday 7/18. MRI completed. Per note "therethere are multifocal ischemic infarctions involving multiple vascular territories." Patient has past medical hx of GERD, CKD stage IV chronic pain, and PTSD.   Code stroke activated 7/19 by [facility] RN. Patient was last seen baseline at 0815 and now complaining of right eye vision loss. Dr. Leonie Man and [place], RRT at the bedside on patient arrival. Vision loss had resolved.  NIHSS completed by RRT 10, see documentation for details and code stroke times. No imaging ordered at this time. Patient is not a candidate for tPA due to recent stroke and symptom resolved. Code stroke canceled. Plan- continue current stroke care.   Chase [name] Stroke Response RN

## 2019-10-18 NOTE — Progress Notes (Signed)
Pt feeling tired and weak. HgB 7.8 MD aware orders to continue to monitor.

## 2019-10-18 NOTE — Consult Note (Signed)
Physical Medicine and Rehabilitation Consult   Reason for Consult: Stroke with functional deficits.  Referring Physician: Dr. Loleta Books   HPI: Chase [name] is a 54 y.o. male with history of gastric bypass , PTSD, chronic pain with cervical radiculopathy, CKD, urinary retention- foley, who was admitted on 10/17/19 to [facility] after a fall while getting out of the bed in am with left sided weakness with numbness and tingling. UDS negative. He also reported issues with dysuria and difficulty voiding--in process of being evaluated at [facility].  He was found to have tick on the back of his neck, slurred speech as well as elevated troponin-308 and leucocytosis with UTI. CTA/perfusion was negative for LVO or stenosis and patient felt to be out of window for tPA. Family reported frequent falls with left sided weakness at baseline and multiple hospitalizations in the past few years. He was placed on doxycyline and rocephin for treatment.    He was transferred to [facility] for work up. He did have transient increase in LLE weakness and loss of vision in right eye on 07/19 am.   Stat CT head was negative for bleed and showed subtle cortical infarct in right cerebellum.  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 2D echo pending. Dr. Leonie Man felt that stroke was embolic due to unknown source. ASA recommended but this d/c due to thrombocytopenia- [id number]. Nephrology consulted due to acute on chronic renal failure with metabolic acidosis and agreed with [facility].  Shigella toxin ordered due to chronic diarrhea. Hem/Onc consulted for input and felt that work up with low probability of TTP and ADAMTS 13 for work of thrombocytopenia. Therapy evaluation revealed poor balance with right lean, left sided weakness, deficits in processing with poor awareness of deficits affecting ADLs and mobility. CIR recommended due to functional decline.    When asked why wouldn't open his  eyes, the entire visit- he said I don't know why- I guess it feels better.  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 Also says feels groggy.  On enteric precautions.  Says sons live in [place] and [place] and "Chase take care of him", but no one close by.    Review of Systems  Unable to perform ROS: Other  Respiratory: Negative for cough and shortness of breath.   Cardiovascular: Positive for leg swelling. Negative for chest pain.  Gastrointestinal: Negative for abdominal pain.  Genitourinary:       Foley placed at [facility] in [place] due to retention.   Skin: Positive for rash (on BLE for a month since legs started swelling).     Past Surgical History:  Procedure Laterality Date  . GASTRIC BYPASS    . HERNIA REPAIR      Family History  Problem Relation Age of Onset  . Renal cancer Father   . Heart disease Paternal Grandfather      Social History:  Widowed. Lives alone and independent with cane PTA. He reports that he has never smoked. He uses smokeless tobacco--uses 8-10 pouches/day. He reports that he does not drink alcohol and does not use drugs.   Allergies  Allergen Reactions  . Nsaids     Other reaction(s): Other Bleeding GI Ulceration    Medications Prior to Admission  Medication Sig Dispense Refill  . cyanocobalamin (,VITAMIN B-12,) 1000 MCG/ML injection Inject 1,000 mcg into the muscle every 30 (thirty) days.     . DULoxetine (CYMBALTA) 30 MG capsule Take 30-60 mg  by mouth See admin instructions. Take 2 capsules in the morning and 1 capsule in the evening    . gabapentin (NEURONTIN) 800 MG tablet Take 3 tablets by mouth at bedtime.    . hydrOXYzine (ATARAX/VISTARIL) 10 MG tablet Take 10-20 mg by mouth daily as needed for anxiety.     . midodrine (PROAMATINE) 2.5 MG tablet Take 1 tablet by mouth 3 (three) times daily.    . pantoprazole (PROTONIX) 40 MG tablet Take 40 mg by mouth 2 (two) times daily.     . tamsulosin (FLOMAX) 0.4 MG CAPS capsule Take 0.4 mg by mouth daily.     .  traMADol (ULTRAM) 50 MG tablet Take 50 mg by mouth 3 (three) times daily.       Home: Home Living Family/patient expects to be discharged to:: Private residence Living Arrangements: Alone Available Help at Discharge: Family, Friend(s), Available PRN/intermittently Type of Home: House Home Access: Stairs to enter Technical brewer of Steps: 5 Entrance Stairs-Rails: None Home Layout: One level Bathroom Shower/Tub: Multimedia programmer: Handicapped height [place]: [place] in, [place] - single point  Functional History: Prior Function Level of Independence: Independent with assistive device(s) Comments: Pt reports not needing help with ADLs at home Functional Status:  Mobility: Bed Mobility Overal bed mobility: Needs Assistance Bed Mobility: Supine to Sit Supine to sit: Max assist, +2 for physical assistance General bed mobility comments: To elevated trunk and advance lower legs off EOB Transfers Overall transfer level: Needs assistance Equipment used: 2 person hand held assist Transfers: Sit to/from Stand, Stand Pivot Transfers Sit to Stand: Mod assist, +2 physical assistance Stand pivot transfers: Mod assist, +2 physical assistance General transfer comment: Mod A due to decreased movement and strength on L side Ambulation/Gait General Gait Details: Unable to progress to gait training this session    ADL: ADL Overall ADL's : Needs assistance/impaired Grooming: Wash/dry face, Set up, Sitting Grooming Details (indicate cue type and reason): pt able to wash/dry face in sitting with set up Upper Body Bathing: Maximal assistance, Sitting Upper Body Bathing Details (indicate cue type and reason): Max A for sitting balance and decreased strength/ROM in L arm Lower Body Bathing: Maximal assistance, Sitting/lateral leans Lower Body Bathing Details (indicate cue type and reason): Max due to decreased sitting balance and unable to bend over to reach  lower legs Upper Body Dressing : Maximal assistance, Sitting Upper Body Dressing Details (indicate cue type and reason): Max A in sitting for decreased balance and strength Lower Body Dressing: Total assistance, +2 for safety/equipment Lower Body Dressing Details (indicate cue type and reason): for reliance on BUE support Toilet Transfer: Stand-pivot, BSC, Moderate assistance, +2 for physical assistance Toilet Transfer Details (indicate cue type and reason): simulated to recliner Toileting- Clothing Manipulation and Hygiene: Total assistance, +2 for physical assistance, Sit to/from stand Toileting - Clothing Manipulation Details (indicate cue type and reason): for reliance on BUE support Tub/Shower Transfer Details (indicate cue type and reason): deferred for pt safety Functional mobility during ADLs: Moderate assistance, +2 for physical assistance General ADL Comments: Pt with decreased balance, weakness, and coordination on L-side  Cognition: Cognition Overall Cognitive Status: No family/caregiver present to determine baseline cognitive functioning Orientation Level: Oriented X4 Cognition Arousal/Alertness: Lethargic Behavior During Therapy: Flat affect Overall Cognitive Status: No family/caregiver present to determine baseline cognitive functioning Area of Impairment: Problem solving, Awareness, Following commands, Attention Current Attention Level: Selective Following Commands: Follows one step commands consistently, Follows one step commands with  increased time Awareness: Emergent Problem Solving: Slow processing, Decreased initiation, Difficulty sequencing, Requires verbal cues, Requires tactile cues General Comments: Pt A&Ox4   Blood pressure 113/77, pulse 68, temperature 97.9 F (36.6 C), temperature source Oral, resp. rate 16, height 5\' 8"  (1.727 m), weight 55.3 kg, SpO2 100 %. Physical Exam Vitals and nursing note reviewed.  Constitutional:      Appearance: He is  cachectic.     Comments: Did not attempt to engage with examiner. Multiple tattoos noted.   Pt sitting up in bed- with lunch on his lap/chest, eyes closed, very lackadaisical- almost uncaring/not engaged, NAD  Getting IV blood and doxycycline currently  HENT:     Head: Normocephalic and atraumatic.     Comments: Tick bite?swelling posterior neck L facial sensation decreased compared to right- L facial droop- unable to stick tongue out for me.    Right Ear: External ear normal.     Left Ear: External ear normal.     Nose: Nose normal. No congestion.     Mouth/Throat:     Mouth: Mucous membranes are dry.     Pharynx: No oropharyngeal exudate.  Eyes:     Comments: Kept eyes closed- talking but not engaged- wouldn't open eyes even when said they WERE open, were not.   Neck:     Comments: Swelling R neck- tick bite? Cardiovascular:     Pulses: Normal pulses.     Comments: RRR Genitourinary:    Comments: Foley in place- draining light amber urine Musculoskeletal:     Comments: Left foot drop. Very poor response/effort with MMT- however LUE was ~ 2 to 2-/5 in biceps, triceps, WE, grip and finger abd; same in LLE- HF, KE, DF and PF-  Pt has at least antigravity on R side, but very poor effort.  Skin:    Findings: Rash present.     Comments: Petechial/echhycmoses lesions from knees down bilaterally. 1-2+ pedal edema left > right.     Neurological:     Mental Status: He is oriented to person, place, and time.     Comments: Soft voice. Left facial weakness. Kept eyes closed with head turned to the right. Exteremly slow and limited verbal output and kept eyes closed for most of exam. Did not open eyes for testing. Left sided weakness noted but poor effort with MMT.      Decreased sensation to light touch in LUE and LLE  Psychiatric:        Mood and Affect: Affect is blunt.        Speech: He is noncommunicative. Speech is delayed.        Behavior: Behavior is withdrawn.     Comments:  Lackadaisical, unengaged- flat affect- said was groggy, but very delayed responses      Results for orders placed or performed during the hospital encounter of 10/17/19 (from the past 24 hour(s))  Hemoglobin A1c     Status: None   Collection Time: 10/18/19 12:13 AM  Result Value Ref Range   Hgb A1c MFr Bld 5.4 4.8 - 5.6 %   Mean Plasma Glucose 108.28 mg/dL  Lipid panel     Status: Abnormal   Collection Time: 10/18/19 12:13 AM  Result Value Ref Range   Cholesterol 60 0 - 200 mg/dL   Triglycerides 67 <150 mg/dL   HDL 21 (L) >40 mg/dL   Total CHOL/HDL Ratio 2.9 RATIO   VLDL 13 0 - 40 mg/dL   LDL Cholesterol 26 0 - 99 mg/dL  CBC with Differential/Platelet     Status: Abnormal   Collection Time: 10/18/19 12:13 AM  Result Value Ref Range   WBC 11.7 (H) 4.0 - 10.5 K/uL   RBC 2.83 (L) 4.22 - 5.81 MIL/uL   Hemoglobin 7.8 (L) 13.0 - 17.0 g/dL   HCT 25.2 (L) 39 - 52 %   MCV 89.0 80.0 - 100.0 fL   MCH 27.6 26.0 - 34.0 pg   MCHC 31.0 30.0 - 36.0 g/dL   RDW 17.3 (H) 11.5 - 15.5 %   Platelets 61 (L) 150 - 400 K/uL   nRBC 0.0 0.0 - 0.2 %   Neutrophils Relative % 93 %   Neutro Abs 10.9 (H) 1.7 - 7.7 K/uL   Lymphocytes Relative 4 %   Lymphs Abs 0.5 (L) 0.7 - 4.0 K/uL   Monocytes Relative 2 %   Monocytes Absolute 0.3 0 - 1 K/uL   Eosinophils Relative 0 %   Eosinophils Absolute 0.1 0 - 0 K/uL   Basophils Relative 0 %   Basophils Absolute 0.0 0 - 0 K/uL   Immature Granulocytes 1 %   Abs Immature Granulocytes 0.08 (H) 0.00 - 0.07 K/uL  Renal function panel     Status: Abnormal   Collection Time: 10/18/19 12:13 AM  Result Value Ref Range   Sodium 135 135 - 145 mmol/L   Potassium 3.5 3.5 - 5.1 mmol/L   Chloride 115 (H) 98 - 111 mmol/L   CO2 14 (L) 22 - 32 mmol/L   Glucose, Bld 88 70 - 99 mg/dL   BUN 41 (H) 6 - 20 mg/dL   Creatinine, Ser 3.52 (H) 0.61 - 1.24 mg/dL   Calcium 7.4 (L) 8.9 - 10.3 mg/dL   Phosphorus 5.1 (H) 2.5 - 4.6 mg/dL   Albumin 1.6 (L) 3.5 - 5.0 g/dL   GFR calc non  Af Amer 18 (L) >60 mL/min   GFR calc Af Amer 21 (L) >60 mL/min   Anion gap 6 5 - 15  Type and screen [place]     Status: None   Collection Time: 10/18/19 12:13 AM  Result Value Ref Range   ABO/RH(D) O POS    Antibody Screen NEG    Sample Expiration      10/21/2019,2359 Performed at [facility], [address] [place], [place] [address]   HIV Antibody (routine testing w rflx)     Status: None   Collection Time: 10/18/19 12:13 AM  Result Value Ref Range   HIV Screen 4th Generation wRfx Non Reactive Non Reactive   CT Angio Head W or Wo Contrast  Result Date: 10/17/2019 CLINICAL DATA:  [name] and slurred speech EXAM: CT ANGIOGRAPHY HEAD AND NECK TECHNIQUE: Multidetector CT imaging of the head and neck was performed using the standard protocol during bolus administration of intravenous contrast. Multiplanar CT image reconstructions and MIPs were obtained to evaluate the vascular anatomy. Carotid stenosis measurements (when applicable) are obtained utilizing NASCET criteria, using the distal internal carotid diameter as the denominator. CONTRAST:  46mL OMNIPAQUE IOHEXOL 350 MG/ML SOLN COMPARISON:  None. FINDINGS: CTA NECK Aortic arch: Great vessel origins are incompletely included. Visualized arch is unremarkable. Right carotid system: Patent. There is no measurable stenosis at the ICA origin. Left carotid system: Common carotid origin is not included. Patent. Trace calcified plaque at the ICA origin without measurable stenosis. Vertebral arteries: Patent. Left vertebral artery is slightly dominant. Skeleton: Evidence of prior anterior fusion at C3-C4 with plate and  screw fixation and well incorporated interbody graft. Degenerative changes of the cervical spine. Other neck: No mass or adenopathy. Upper chest: Included upper lungs are clear. Review of the MIP images confirms the above findings CTA HEAD Anterior circulation: Intracranial internal carotid arteries  are patent. Anterior cerebral arteries are patent. 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 Middle cerebral arteries are patent. Posterior circulation: Intracranial vertebral arteries, basilar artery, and posterior cerebral arteries are patent. Venous sinuses: Patent as allowed by contrast bolus timing. Review of the MIP images confirms the above findings IMPRESSION: No large vessel occlusion or hemodynamically significant stenosis. Electronically Signed   By: Macy Mis M.D.   On: 10/17/2019 13:15   CT HEAD WO CONTRAST  Result Date: 10/18/2019 CLINICAL DATA:  Follow-up stroke. [name] and numbness. Scattered small infarctions by MRI. EXAM: CT HEAD WITHOUT CONTRAST TECHNIQUE: Contiguous axial images were obtained from the base of the skull through the vertex without intravenous contrast. COMPARISON:  10/17/2019 FINDINGS: Brain: No acute finding affecting the brainstem or cerebellum by CT. Cerebral hemispheres show generalized atrophy, somewhat premature for age. Subtle evidence cortical infarction in the right occipital lobe. No mass effect or hemorrhage. Other small infarctions seen by MRI not visible by CT. No hemorrhage, hydrocephalus or extra-axial collection. Vascular: There is atherosclerotic calcification of the major vessels at the base of the brain. Skull: Negative Sinuses/Orbits: Clear/normal Other: None IMPRESSION: Subtle evidence of cortical infarction in the right occipital region. No mass effect or hemorrhage. None of the other acute infarctions visible by MRI are visible on CT. Generalized volume loss, somewhat premature for age. Electronically Signed   By: Nelson Chimes M.D.   On: 10/18/2019 13:24   MR BRAIN WO CONTRAST  Result Date: 10/18/2019 CLINICAL DATA:  Stroke follow-up.  [name] and numbness. EXAM: MRI HEAD WITHOUT CONTRAST TECHNIQUE: Multiplanar, multiecho pulse sequences of the brain and surrounding structures were obtained without intravenous contrast.  COMPARISON:  Head CT and CTA 10/17/2019 FINDINGS: Brain: There are subcentimeter acute infarcts in the cerebellum bilaterally. There is a small acute cortical infarct in the posteromedial right occipital lobe. There are multiple subcentimeter acute infarcts in the right corpus callosum, right cingulate gyrus, and right greater than left frontal lobes. No intracranial hemorrhage, mass, midline shift, or extra-axial fluid collection is identified. The ventricles and sulci are normal. Vascular: Major intracranial vascular flow voids are preserved. Skull and upper cervical spine: No suspicious marrow lesion. Anterior fusion at C3-4. Sinuses/Orbits: Unremarkable orbits. Clear paranasal sinuses. Small right mastoid effusion. Other: None. 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 Electronically Signed   By: Logan Bores M.D.   On: 10/18/2019 07:34   DG Chest Portable 1 View  Result Date: 10/17/2019 CLINICAL DATA:  Weakness, Reports went to bed last night today awakened and fell to the left sitting up - then fell out of bed In ED makes sffort to raise L arm, but cannot Cannot raise either R or L leg EXAM: PORTABLE CHEST 1 VIEW COMPARISON:  None. FINDINGS: The heart size and mediastinal contours are within normal limits. Low lung volumes. The lungs are clear. No pneumothorax or significant pleural effusion. No acute finding in the visualized skeleton. Prominent distended loops of bowel in the abdomen. IMPRESSION: No acute cardiopulmonary finding. Electronically Signed   By: Audie Pinto M.D.   On: 10/17/2019 13:59   CT HEAD CODE STROKE WO CONTRAST  Result Date: 10/17/2019 CLINICAL DATA:  Code stroke. Subacute deficit with left-sided  weakness. EXAM: CT HEAD WITHOUT CONTRAST TECHNIQUE: Contiguous axial images were obtained from the base of the skull through the vertex without intravenous contrast. COMPARISON:  None. FINDINGS: Brain: No evidence of acute  infarction, hemorrhage, hydrocephalus, extra-axial collection or mass lesion/mass effect. Vascular: Negative for hyperdense vessel Skull: Negative Sinuses/Orbits: Mild mucosal edema left maxillary sinus otherwise clear sinuses. Negative orbit. Other: None ASPECTS ([place] Stroke Program Early CT Score) - Ganglionic level infarction (caudate, lentiform nuclei, internal capsule, insula, M1-M3 cortex): 7 - Supraganglionic infarction (M4-M6 cortex): 3 Total score (0-10 with 10 being normal): 10 IMPRESSION: 1. No acute abnormality 2. ASPECTS is 10 3. These results were called by telephone at the time of interpretation on 10/17/2019 at 12:56 pm to provider MATTHEW TRIFAN , who verbally acknowledged these results. Electronically Signed   By: Franchot Gallo M.D.   On: 10/17/2019 12:54   CT ANGIO NECK CODE STROKE  Result Date: 10/17/2019 CLINICAL DATA:  [name] and slurred speech EXAM: CT ANGIOGRAPHY HEAD AND NECK TECHNIQUE: Multidetector CT imaging of the head and neck was performed using the standard protocol during bolus administration of intravenous contrast. Multiplanar CT image reconstructions and MIPs were obtained to evaluate the vascular anatomy. Carotid stenosis measurements (when applicable) are obtained utilizing NASCET criteria, using the distal internal carotid diameter as the denominator. CONTRAST:  40mL OMNIPAQUE IOHEXOL 350 MG/ML SOLN COMPARISON:  None. FINDINGS: CTA NECK Aortic arch: Great vessel origins are incompletely included. Visualized arch is unremarkable. Right carotid system: Patent. There is no measurable stenosis at the ICA origin. Left carotid system: Common carotid origin is not included. Patent. Trace calcified plaque at the ICA origin without measurable stenosis. Vertebral arteries: Patent. Left vertebral artery is slightly dominant. Skeleton: Evidence of prior anterior fusion at C3-C4 with plate and screw fixation and well incorporated interbody graft. Degenerative changes of  the cervical spine. Other neck: No mass or adenopathy. Upper chest: Included upper lungs are clear. Review of the MIP images confirms the above findings CTA HEAD Anterior circulation: Intracranial internal carotid arteries are patent. Anterior cerebral arteries are patent. 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 Middle cerebral arteries are patent. Posterior circulation: Intracranial vertebral arteries, basilar artery, and posterior cerebral arteries are patent. Venous sinuses: Patent as allowed by contrast bolus timing. Review of the MIP images confirms the above findings IMPRESSION: No large vessel occlusion or hemodynamically significant stenosis. Electronically Signed   By: Macy Mis M.D.   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 Does the need for close, 24 hr/day medical supervision in concert with the patient's rehab needs make it unreasonable for this patient to be served in a less intensive setting? Potentially 2. 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 Due to bladder management, bowel management, safety, skin/wound care, disease management, medication administration, pain management and patient education, does the patient require 24 hr/day rehab nursing? Potentially 4. Does the patient require coordinated care of a physician, rehab nurse, therapy disciplines of PT, OT and SLP to address physical and functional deficits in the context of the above medical diagnosis(es)? Yes Addressing deficits in the following areas: balance, endurance, locomotion, strength, transferring, bowel/bladder control, bathing, dressing, feeding, grooming, toileting and cognition 5. Can the patient actively  participate in an intensive therapy program of  at least 3 hrs of therapy per day at least 5 days per week? Yes 6. The potential for patient to make measurable gains while on inpatient rehab is fair 7. Anticipated functional outcomes upon discharge from inpatient rehab are supervision, min assist and mod assist  with PT, supervision, min assist and mod assist with OT, min assist with SLP. 8. Estimated rehab length of stay to reach the above functional goals is: not sure at this time 9. Anticipated discharge destination: not clear- it depends on family support 10. Overall Rehab/Functional Prognosis: fair  RECOMMENDATIONS: This patient's condition is appropriate for continued rehabilitative care in the following setting: not clear yet- it all depends on family support intensity- pt is current max-total assist and has cognitive issues it appeared- Chase need 24 hr care Patient has agreed to participate in recommended program. Potentially Note that insurance prior authorization may be required for reimbursement for recommended care.  Comment:  1. Chase need Speech consult to more fully determine overall plan.  2. Pt is max-total assist currently- also poor engagement- not sure if this is pt's behavior or cognitive issues.  3. Pt would not open his eyes during exam- and even though THOUGHT opened eyes, didn't- this could be neglect, but was able to identify L side when touched. 4. I think it's hard to determine CIR vs SNF at this point without more info about family support short term as well as long term.  5. Chase have admission coordinators follow to get more info 6. Thank you for this consult   Bary Leriche, PA-C 10/18/2019    I have personally performed a face to face diagnostic evaluation of this patient and formulated the key components of the plan.  Additionally, I have personally reviewed laboratory data, imaging studies, as well as relevant notes and concur with the physician assistant's  documentation above.

## 2019-10-18 NOTE — Consult Note (Addendum)
Chase [name]  [phone] [phone]    Chase [name]  Referring MD: Chase [name]  Reason for Referral: Thrombocytopenia  HPI: Chase [name] is a 54 year old male with a past medical history significant for GERD, PTSD, CKD stage IV, history of cervical radiculopathy, chronic pain syndrome. Review of outside records at [facility] show that the patient has been seen by Hematology/Oncology in the past for Hodgkin's lymphoma (diagnosed 01/2009; s/p 6 cycles ABVD completed 06/2009) and anemia.  It appears that he has received IV iron and B12 injections for his anemia in the past.  Last visit was 12/10/2012. The patient presented to the emergency room with left arm and left leg weakness.  At baseline, he ambulates with the help of a cane.  He reports that he went to bed normal and woke up on the morning of admission unable to move his left side.  On admission, his WBC was 18.8, hemoglobin 8.6, and platelet count 69,000.  His sodium was 133, potassium 3.1, BUN 42, creatinine 3.74 (appears to be consistent with baseline compared to labs through care everywhere), albumin 2.0.  His LDH was normal at 120, DIC panel showed an elevated PT 18.8, elevated INR at 1.6, elevated PTT at 46, normal fibrinogen, elevated D-dimer at 3.19.  Smear review indicates that schistocytes were present.  Haptoglobin normal at 130.  CT head code stroke without contrast on admission showed no acute abnormality.  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  CT head repeated this morning without contrast showed subtle evidence of cortical infarction in the right occipital region, no mass-effect or hemorrhage.  The patient has been seen by neurology who recommends TTE, starting aspirin, and starting statin.  The most recent CBC available to me through care everywhere was performed on  08/27/2019.  His WBC was 9.4, hemoglobin 9.9, and platelet count was 188,000. Of note, the patient had an engorged tick on the back of his neck and removed in the ER.   Today, the patient reports no improvement in his left-sided weakness.  He denies headaches and dizziness.  He does report visual loss in his right eye.  States that he notices slowness in his mentation.  Denies recent fevers or chills.  States that he was unaware of the engorged tick and not sure how long it was there.  He denies chest pain, cough, hemoptysis.  Denies shortness of breath.  Denies abdominal pain, nausea, vomiting.  He has not noticed any epistaxis, hematemesis, hematuria, melena, hematochezia.  Reports lower extremity edema.  Notices a rash on his lower extremities which developed after he noticed the increased edema.  The patient is widowed.  He has 2 adult sons.  Denies alcohol use.  Does not smoke cigarettes but reports that he uses chewing tobacco on a regular basis.  Denies family history of malignancies and blood disorders.  Hematology was asked see the patient to make recommendations regarding his thrombocytopenia.   Past Medical History:  Diagnosis Date  . Chemical exposure    service related injury  . PTSD (post-traumatic stress disorder)   . Renal disorder   :    Past Surgical History:  Procedure Laterality Date  . GASTRIC BYPASS    . HERNIA REPAIR    :   CURRENT MEDS: Current Facility-Administered Medications  Medication Dose Route Frequency Provider Last Rate Last Admin  . acetaminophen (TYLENOL) tablet 650 mg  650 mg Oral Q4H PRN Chase [name], Chase K, MD       Or  . acetaminophen (TYLENOL) 160 MG/5ML solution 650 mg  650 mg Per Tube Q4H PRN Chase [name], Chase K, MD       Or  . acetaminophen (TYLENOL) suppository 650 mg  650 mg Rectal Q4H PRN Chase [name], Chase K, MD      . cefTRIAXone (ROCEPHIN) 2 g in sodium chloride 0.9 % 100 mL IVPB  2 g Intravenous Q24H Chase [name], Chase Larry, MD 200 mL/hr at 10/18/19  1331 2 g at 10/18/19 1331  . Chlorhexidine Gluconate Cloth 2 % PADS 6 each  6 each Topical Daily Chase [name], Chase K, MD   6 each at 10/18/19 0826  . doxycycline (VIBRAMYCIN) 100 mg in sodium chloride 0.9 % 250 mL IVPB  100 mg Intravenous Q12H Chase [name], Chase Larry, MD      . DULoxetine (CYMBALTA) DR capsule 30 mg  30 mg Oral QHS Chase [name], Chase K, MD   30 mg at 10/17/19 2129  . DULoxetine (CYMBALTA) DR capsule 60 mg  60 mg Oral Daily Chase [name], Chase K, MD   60 mg at 10/18/19 0831  . gabapentin (NEURONTIN) capsule 300 mg  300 mg Oral BID Chase Emerald, MD   300 mg at 10/18/19 0825  . HYDROmorphone (DILAUDID) injection 0.5 mg  0.5 mg Intravenous Q4H PRN Chase [name], Chase K, MD      . hydrOXYzine (ATARAX/VISTARIL) tablet 10 mg  10 mg Oral TID PRN Chase [name], Chase K, MD      . midodrine (PROAMATINE) tablet 2.5 mg  2.5 mg Oral TID Chase [name], Chase K, MD   2.5 mg at 10/18/19 1251  . pantoprazole (PROTONIX) EC tablet 40 mg  40 mg Oral BID AC Chase [name], Chase K, MD   40 mg at 10/18/19 0643  . [START ON 10/19/2019] pneumococcal 23 valent vaccine (PNEUMOVAX-23) injection 0.5 mL  0.5 mL Intramuscular Tomorrow-1000 Chase [name], Chase K, MD      . sodium bicarbonate tablet 325 mg  325 mg Oral TID Chase [name], Chase K, MD   325 mg at 10/18/19 0826  . tamsulosin (FLOMAX) capsule 0.4 mg  0.4 mg Oral Daily Chase [name], Chase K, MD   0.4 mg at 10/18/19 0825  . traMADol (ULTRAM) tablet 50 mg  50 mg Oral Q12H Chase [name], Chase K, MD   50 mg at 10/18/19 0825      Allergies  Allergen Reactions  . Nsaids     Other reaction(s): Other Bleeding GI Ulceration  :  History reviewed. No pertinent family history.:  Social History   Socioeconomic History  . Marital status: Widowed    Spouse name: Not on file  . Number of children: Not on file  . Years of education: Not on file  . Highest education level: Not on file  Occupational History  . Not on file  Tobacco Use  . Smoking status: Never Smoker  . Smokeless tobacco: Current User  Substance  and Sexual Activity  . Alcohol use: Never  . Drug use: Never  . Sexual activity: Not on file  Other Topics Concern  . Not on file  Social History Narrative  . Not on file   Social Determinants of Health   Financial Resource Strain:   . Difficulty of Paying Living Expenses:   Food Insecurity:   . Worried About Charity fundraiser in the Last Year:   . Arboriculturist in the Last Year:   Transportation Needs:   .  Lack of Transportation (Medical):   Marland Kitchen Lack of Transportation (Non-Medical):   Physical Activity:   . Days of Exercise per Week:   . Minutes of Exercise per Session:   Stress:   . Feeling of Stress :   Social Connections:   . Frequency of Communication with Friends and Family:   . Frequency of Social Gatherings with Friends and Family:   . Attends Religious Services:   . Active Member of Clubs or Organizations:   . Attends Archivist Meetings:   Marland Kitchen Marital Status:   Intimate Partner Violence:   . Fear of Current or Ex-Partner:   . Emotionally Abused:   Marland Kitchen Physically Abused:   . Sexually Abused:   :  REVIEW OF SYSTEMS:  A comprehensive 14 point review of systems as negative except as noted in the HPI.    Exam: Patient Vitals for the past 24 hrs:  BP Temp Temp src Pulse Resp SpO2  10/18/19 0900 -- -- -- 65 14 100 %  10/18/19 0811 116/73 97.8 F (36.6 C) Oral 64 16 100 %  10/18/19 0354 101/67 97.7 F (36.5 C) Oral 63 20 100 %  10/18/19 0022 116/71 98.1 F (36.7 C) Oral (!) 56 10 99 %  10/17/19 2221 102/73 97.9 F (36.6 C) Oral 62 10 99 %  10/17/19 2021 107/67 97.9 F (36.6 C) Oral 60 11 100 %  10/17/19 1805 103/69 97.7 F (36.5 C) Oral (!) 57 16 95 %  10/17/19 1710 -- (!) 97.4 F (36.3 C) Oral -- -- --  10/17/19 1703 115/66 -- -- (!) 58 11 100 %  10/17/19 1628 109/64 -- -- (!) 54 14 100 %  10/17/19 1615 109/64 -- -- (!) 55 13 100 %  10/17/19 1500 108/63 -- -- (!) 53 11 100 %  10/17/19 1432 104/64 -- -- -- 11 --  10/17/19 1400 107/66 -- -- --  10 --    General: Awake and alert, no distress Eyes: EOMI, PERRL.  No scleral icterus.   ENT:  There were no oropharyngeal lesions.   Neck was without thyromegaly.   Lymphatics:  Negative cervical, supraclavicular or axillary adenopathy.   Respiratory: lungs were clear bilaterally without wheezing or crackles.   Cardiovascular:  Regular rate and rhythm, S1/S2, without murmur, rub or gallop.  1+ bilateral pedal edema. GI:  abdomen was soft, flat, nontender, nondistended, without organomegaly.   Musculoskeletal: Right upper and right lower extremity 5/5 strength, left upper extremity 3/5 strength and left lower extremity 1/5 strength Skin: Petechiae noted to the bilateral legs Neuro: Patient was alert and oriented.  Attention was good.   Language was appropriate.  Mood was normal without depression.  Speech was not pressured.  Thought content was not tangential.    LABS:  Lab Results  Component Value Date   WBC 11.7 (H) 10/18/2019   HGB 7.8 (L) 10/18/2019   HCT 25.2 (L) 10/18/2019   PLT 61 (L) 10/18/2019   GLUCOSE 88 10/18/2019   CHOL 60 10/18/2019   TRIG 67 10/18/2019   HDL 21 (L) 10/18/2019   LDLCALC 26 10/18/2019   ALT 18 10/17/2019   AST 18 10/17/2019   NA 135 10/18/2019   [name] 3.5 10/18/2019   CL 115 (H) 10/18/2019   CREATININE 3.52 (H) 10/18/2019   BUN 41 (H) 10/18/2019   CO2 14 (L) 10/18/2019   INR 1.6 (H) 10/17/2019   HGBA1C 5.4 10/18/2019    CT Angio Head W or Wo Contrast  Result  Date: 10/17/2019 CLINICAL DATA:  [name] and slurred speech EXAM: CT ANGIOGRAPHY HEAD AND NECK TECHNIQUE: Multidetector CT imaging of the head and neck was performed using the standard protocol during bolus administration of intravenous contrast. Multiplanar CT image reconstructions and MIPs were obtained to evaluate the vascular anatomy. Carotid stenosis measurements (when applicable) are obtained utilizing NASCET criteria, using the distal internal carotid diameter as the  denominator. CONTRAST:  44mL OMNIPAQUE IOHEXOL 350 MG/ML SOLN COMPARISON:  None. FINDINGS: CTA NECK Aortic arch: Great vessel origins are incompletely included. Visualized arch is unremarkable. Right carotid system: Patent. There is no measurable stenosis at the ICA origin. Left carotid system: Common carotid origin is not included. Patent. Trace calcified plaque at the ICA origin without measurable stenosis. Vertebral arteries: Patent. Left vertebral artery is slightly dominant. Skeleton: Evidence of prior anterior fusion at C3-C4 with plate and screw fixation and well incorporated interbody graft. Degenerative changes of the cervical spine. Other neck: No mass or adenopathy. Upper chest: Included upper lungs are clear. Review of the MIP images confirms the above findings CTA HEAD Anterior circulation: Intracranial internal carotid arteries are patent. Anterior cerebral arteries are patent. 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 Middle cerebral arteries are patent. Posterior circulation: Intracranial vertebral arteries, basilar artery, and posterior cerebral arteries are patent. Venous sinuses: Patent as allowed by contrast bolus timing. Review of the MIP images confirms the above findings IMPRESSION: No large vessel occlusion or hemodynamically significant stenosis. Electronically Signed   By: Chase Mis [name].   On: 10/17/2019 13:15   CT HEAD WO CONTRAST  Result Date: 10/18/2019 CLINICAL DATA:  Follow-up stroke. [name] and numbness. Scattered small infarctions by MRI. EXAM: CT HEAD WITHOUT CONTRAST TECHNIQUE: Contiguous axial images were obtained from the base of the skull through the vertex without intravenous contrast. COMPARISON:  10/17/2019 FINDINGS: Brain: No acute finding affecting the brainstem or cerebellum by CT. Cerebral hemispheres show generalized atrophy, somewhat premature for age. Subtle evidence cortical infarction in the right occipital lobe. No mass effect or hemorrhage. Other  small infarctions seen by MRI not visible by CT. No hemorrhage, hydrocephalus or extra-axial collection. Vascular: There is atherosclerotic calcification of the major vessels at the base of the brain. Skull: Negative Sinuses/Orbits: Clear/normal Other: None IMPRESSION: Subtle evidence of cortical infarction in the right occipital region. No mass effect or hemorrhage. None of the other acute infarctions visible by MRI are visible on CT. Generalized volume loss, somewhat premature for age. Electronically Signed   By: Chase Chimes [name].   On: 10/18/2019 13:24   MR BRAIN WO CONTRAST  Result Date: 10/18/2019 CLINICAL DATA:  Stroke follow-up.  [name] and numbness. EXAM: MRI HEAD WITHOUT CONTRAST TECHNIQUE: Multiplanar, multiecho pulse sequences of the brain and surrounding structures were obtained without intravenous contrast. COMPARISON:  Head CT and CTA 10/17/2019 FINDINGS: Brain: There are subcentimeter acute infarcts in the cerebellum bilaterally. There is a small acute cortical infarct in the posteromedial right occipital lobe. There are multiple subcentimeter acute infarcts in the right corpus callosum, right cingulate gyrus, and right greater than left frontal lobes. No intracranial hemorrhage, mass, midline shift, or extra-axial fluid collection is identified. The ventricles and sulci are normal. Vascular: Major intracranial vascular flow voids are preserved. Skull and upper cervical spine: No suspicious marrow lesion. Anterior fusion at C3-4. Sinuses/Orbits: Unremarkable orbits. Clear paranasal sinuses. Small right mastoid effusion. Other: None. 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 Electronically Signed   By: Chase Resides  Jeralyn Ruths [name].   On: 10/18/2019 07:34   DG Chest Portable 1 View  Result Date: 10/17/2019 CLINICAL DATA:  Weakness, Reports went to bed last night today awakened and fell to the left sitting up - then fell out of bed In  ED makes sffort to raise L arm, but cannot Cannot raise either R or L leg EXAM: PORTABLE CHEST 1 VIEW COMPARISON:  None. FINDINGS: The heart size and mediastinal contours are within normal limits. Low lung volumes. The lungs are clear. No pneumothorax or significant pleural effusion. No acute finding in the visualized skeleton. Prominent distended loops of bowel in the abdomen. IMPRESSION: No acute cardiopulmonary finding. Electronically Signed   By: Chase [name].   On: 10/17/2019 13:59   CT HEAD CODE STROKE WO CONTRAST  Result Date: 10/17/2019 CLINICAL DATA:  Code stroke. Subacute deficit with left-sided weakness. EXAM: CT HEAD WITHOUT CONTRAST TECHNIQUE: Contiguous axial images were obtained from the base of the skull through the vertex without intravenous contrast. COMPARISON:  None. FINDINGS: Brain: No evidence of acute infarction, hemorrhage, hydrocephalus, extra-axial collection or mass lesion/mass effect. Vascular: Negative for hyperdense vessel Skull: Negative Sinuses/Orbits: Mild mucosal edema left maxillary sinus otherwise clear sinuses. Negative orbit. Other: None ASPECTS ([place] Stroke Program Early CT Score) - Ganglionic level infarction (caudate, lentiform nuclei, internal capsule, insula, M1-M3 cortex): 7 - Supraganglionic infarction (M4-M6 cortex): 3 Total score (0-10 with 10 being normal): 10 IMPRESSION: 1. No acute abnormality 2. ASPECTS is 10 3. These results were called by telephone at the time of interpretation on 10/17/2019 at 12:56 pm to provider MATTHEW TRIFAN , who verbally acknowledged these results. Electronically Signed   By: Franchot Gallo [name].   On: 10/17/2019 12:54   CT ANGIO NECK CODE STROKE  Result Date: 10/17/2019 CLINICAL DATA:  [name] and slurred speech EXAM: CT ANGIOGRAPHY HEAD AND NECK TECHNIQUE: Multidetector CT imaging of the head and neck was performed using the standard protocol during bolus administration of intravenous contrast. Multiplanar CT  image reconstructions and MIPs were obtained to evaluate the vascular anatomy. Carotid stenosis measurements (when applicable) are obtained utilizing NASCET criteria, using the distal internal carotid diameter as the denominator. CONTRAST:  32mL OMNIPAQUE IOHEXOL 350 MG/ML SOLN COMPARISON:  None. FINDINGS: CTA NECK Aortic arch: Great vessel origins are incompletely included. Visualized arch is unremarkable. Right carotid system: Patent. There is no measurable stenosis at the ICA origin. Left carotid system: Common carotid origin is not included. Patent. Trace calcified plaque at the ICA origin without measurable stenosis. Vertebral arteries: Patent. Left vertebral artery is slightly dominant. Skeleton: Evidence of prior anterior fusion at C3-C4 with plate and screw fixation and well incorporated interbody graft. Degenerative changes of the cervical spine. Other neck: No mass or adenopathy. Upper chest: Included upper lungs are clear. Review of the MIP images confirms the above findings CTA HEAD Anterior circulation: Intracranial internal carotid arteries are patent. Anterior cerebral arteries are patent. 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 Middle cerebral arteries are patent. Posterior circulation: Intracranial vertebral arteries, basilar artery, and posterior cerebral arteries are patent. Venous sinuses: Patent as allowed by contrast bolus timing. Review of the MIP images confirms the above findings IMPRESSION: No large vessel occlusion or hemodynamically significant stenosis. Electronically Signed   By: Chase Mis [name].   On: 10/17/2019 13:15     ASSESSMENT AND PLAN:  This is a pleasant 54 year old male with thrombocytopenia.  He presented to the hospital with left-sided weakness and MRI showed small bilateral acute cerebral  and cerebellar infarcts suggesting a central embolic source.  DIC panel did indicate some schistocytes on review.  However additional lab work including an LDH and haptoglobin were  normal pointing away from the diagnosis of TTP.  Reticulocytes and Coombs tests have not been checked. Will request these tests.  Additionally, his plasmic score is 1 indicating low probability of TTP.  A pathologist review of peripheral blood smear is in process.  We will await results of review of blood smear. I have also added an ADAMTS13 for completeness.   The patient will be seen later today by Chase [name] who will make further recommendations.   Thank you for this referral.  Chase Bussing, Chase [name], Chase [name], Chase [name] Mon/Tues/Thurs/Fri 7am-5pm; Off Wednesdays Cell: [phone]  ADDENDUM: Hematology/Oncology Attending: I had a face-to-face encounter with the patient today.  I recommended his care plan.  This is a very pleasant 54 years old white male with multiple medical problems including history of Hodgkin lymphoma treated in 2010 at [place] by Chase [name].  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  He was found after admission to have thrombocytopenia and his platelet count has been over 60,000. The patient also has a history of chronic renal insufficiency and frequent urinary tract infection secondary to benign prostatic hypertrophy and obstruction.  He is currently on several medications for his stroke as well as infection. Review of the peripheral blood smear showed no significant amount of schistocytes and the patient has decreased platelets count. DIC panel showed no significant decline in fibrinogen.  His haptoglobin and LDH are within the normal range. I doubt the patient has TTP based on the above findings.  We also ordered ADAMTS 13 for confirmation. I think his thrombocytopenia is either drug-induced or secondary to underlying infection.  He is currently on several medications that may contribute to his thrombocytopenia including doxycycline as well as Rocephin. I would recommend continuous observation and  treatment of the underlying condition at this point. Will not consider the patient for platelet transfusion unless he has significant bleeding issues or platelets count less than 20,000. The patient and his son agreed to the current plan. Thank you so much for allowing me to participate in the care of Mr. Biel, we will continue to follow up the patient with you and assist in his management on as-needed basis.  Disclaimer: This note was dictated with voice recognition software. Similar sounding words can inadvertently be transcribed and may be missed upon review. Eilleen Kempf, MD

## 2019-10-18 NOTE — Progress Notes (Addendum)
STROKE TEAM PROGRESS NOTE   INTERVAL HISTORY I have personally reviewed history of presenting illness with the patient, electronic medical records and imaging films in PACS.  I had initially examined the patient earlier this morning during rounds but was subsequently called back later when a code stroke was called.  Patient reported transient episode of right eye vision loss as well as increasing left leg weakness.  He stated that his vision became blurred in the right eye and completely lost vision for few minutes.  He tried closing his left eye and noticed and he was not able to see from his right eye.  Is also been complaining of back pain because he has a pilonidal cyst and his left leg movements have also become weaker.  When I examined the patient second time and noticed poor effort on his part with some apparent increase left leg weakness but no big change.  A stat CT scan of the head was obtained which showed no acute abnormality or bleed.  Most of the strokes seen on MRI were not visible on the CT scan.  Vitals:   10/18/19 0022 10/18/19 0354 10/18/19 0811 10/18/19 0900  BP: 116/71 101/67 116/73   Pulse: (!) 56 63 64 65  Resp: 10 20 16 14   Temp: 98.1 F (36.7 C) 97.7 F (36.5 C) 97.8 F (36.6 C)   TempSrc: Oral Oral Oral   SpO2: 99% 100% 100% 100%  Weight:      Height:       CBC:  Recent Labs  Lab 10/17/19 1241 10/17/19 1241 10/17/19 1311 10/18/19 0013  WBC 18.8*  --   --  11.7*  NEUTROABS 17.7*  --   --  10.9*  HGB 8.6*  9.2*  --   --  7.8*  HCT 26.5*  27.0*  --   --  25.2*  MCV 89.5  --   --  89.0  PLT 69*   < > 68* 61*   < > = values in this interval not displayed.   Basic Metabolic Panel:  Recent Labs  Lab 10/17/19 1241 10/18/19 0013  NA 133*  137 135  K 3.1*  3.1* 3.5  CL 111  108 115*  CO2 13* 14*  GLUCOSE 82  79 88  BUN 42*  38* 41*  CREATININE 3.74*  4.10* 3.52*  CALCIUM 7.4* 7.4*  PHOS  --  5.1*   Lipid Panel:  Recent Labs  Lab  10/18/19 0013  CHOL 60  TRIG 67  HDL 21*  CHOLHDL 2.9  VLDL 13  LDLCALC 26   HgbA1c:  Recent Labs  Lab 10/18/19 0013  HGBA1C 5.4   Urine Drug Screen:  Recent Labs  Lab 10/17/19 1238  LABOPIA NONE DETECTED  COCAINSCRNUR NONE DETECTED  LABBENZ NONE DETECTED  AMPHETMU NONE DETECTED  THCU NONE DETECTED  LABBARB NONE DETECTED    Alcohol Level  Recent Labs  Lab 10/17/19 1241  ETH <10    IMAGING past 24 hours CT Angio Head W or Wo Contrast  Result Date: 10/17/2019 CLINICAL DATA:  [name] and slurred speech EXAM: CT ANGIOGRAPHY HEAD AND NECK TECHNIQUE: Multidetector CT imaging of the head and neck was performed using the standard protocol during bolus administration of intravenous contrast. Multiplanar CT image reconstructions and MIPs were obtained to evaluate the vascular anatomy. Carotid stenosis measurements (when applicable) are obtained utilizing NASCET criteria, using the distal internal carotid diameter as the denominator. CONTRAST:  71mL OMNIPAQUE IOHEXOL 350 MG/ML SOLN COMPARISON:  None. FINDINGS: CTA NECK Aortic arch: Great vessel origins are incompletely included. Visualized arch is unremarkable. Right carotid system: Patent. There is no measurable stenosis at the ICA origin. Left carotid system: Common carotid origin is not included. Patent. Trace calcified plaque at the ICA origin without measurable stenosis. Vertebral arteries: Patent. Left vertebral artery is slightly dominant. Skeleton: Evidence of prior anterior fusion at C3-C4 with plate and screw fixation and well incorporated interbody graft. Degenerative changes of the cervical spine. Other neck: No mass or adenopathy. Upper chest: Included upper lungs are clear. Review of the MIP images confirms the above findings CTA HEAD Anterior circulation: Intracranial internal carotid arteries are patent. Anterior cerebral arteries are patent. 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 Middle cerebral arteries are  patent. Posterior circulation: Intracranial vertebral arteries, basilar artery, and posterior cerebral arteries are patent. Venous sinuses: Patent as allowed by contrast bolus timing. Review of the MIP images confirms the above findings IMPRESSION: No large vessel occlusion or hemodynamically significant stenosis. Electronically Signed   By: Macy Mis M.D.   On: 10/17/2019 13:15   MR BRAIN WO CONTRAST  Result Date: 10/18/2019 CLINICAL DATA:  Stroke follow-up.  [name] and numbness. EXAM: MRI HEAD WITHOUT CONTRAST TECHNIQUE: Multiplanar, multiecho pulse sequences of the brain and surrounding structures were obtained without intravenous contrast. COMPARISON:  Head CT and CTA 10/17/2019 FINDINGS: Brain: There are subcentimeter acute infarcts in the cerebellum bilaterally. There is a small acute cortical infarct in the posteromedial right occipital lobe. There are multiple subcentimeter acute infarcts in the right corpus callosum, right cingulate gyrus, and right greater than left frontal lobes. No intracranial hemorrhage, mass, midline shift, or extra-axial fluid collection is identified. The ventricles and sulci are normal. Vascular: Major intracranial vascular flow voids are preserved. Skull and upper cervical spine: No suspicious marrow lesion. Anterior fusion at C3-4. Sinuses/Orbits: Unremarkable orbits. Clear paranasal sinuses. Small right mastoid effusion. Other: None. 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 Electronically Signed   By: Logan Bores M.D.   On: 10/18/2019 07:34   DG Chest Portable 1 View  Result Date: 10/17/2019 CLINICAL DATA:  Weakness, Reports went to bed last night today awakened and fell to the left sitting up - then fell out of bed In ED makes sffort to raise L arm, but cannot Cannot raise either R or L leg EXAM: PORTABLE CHEST 1 VIEW COMPARISON:  None. FINDINGS: The heart size and mediastinal contours are  within normal limits. Low lung volumes. The lungs are clear. No pneumothorax or significant pleural effusion. No acute finding in the visualized skeleton. Prominent distended loops of bowel in the abdomen. IMPRESSION: No acute cardiopulmonary finding. Electronically Signed   By: Audie Pinto M.D.   On: 10/17/2019 13:59   CT HEAD CODE STROKE WO CONTRAST  Result Date: 10/17/2019 CLINICAL DATA:  Code stroke. Subacute deficit with left-sided weakness. EXAM: CT HEAD WITHOUT CONTRAST TECHNIQUE: Contiguous axial images were obtained from the base of the skull through the vertex without intravenous contrast. COMPARISON:  None. FINDINGS: Brain: No evidence of acute infarction, hemorrhage, hydrocephalus, extra-axial collection or mass lesion/mass effect. Vascular: Negative for hyperdense vessel Skull: Negative Sinuses/Orbits: Mild mucosal edema left maxillary sinus otherwise clear sinuses. Negative orbit. Other: None ASPECTS ([place]augh Stroke Program Early CT Score) - Ganglionic level infarction (caudate, lentiform nuclei, internal capsule, insula, M1-M3 cortex): 7 - Supraganglionic infarction (M4-M6 cortex): 3 Total score (0-10 with 10 being normal): 10 IMPRESSION: 1. No acute abnormality 2.  ASPECTS is 10 3. These results were called by telephone at the time of interpretation on 10/17/2019 at 12:56 pm to provider MATTHEW TRIFAN , who verbally acknowledged these results. Electronically Signed   By: Franchot Gallo M.D.   On: 10/17/2019 12:54   CT ANGIO NECK CODE STROKE  Result Date: 10/17/2019 CLINICAL DATA:  [name] and slurred speech EXAM: CT ANGIOGRAPHY HEAD AND NECK TECHNIQUE: Multidetector CT imaging of the head and neck was performed using the standard protocol during bolus administration of intravenous contrast. Multiplanar CT image reconstructions and MIPs were obtained to evaluate the vascular anatomy. Carotid stenosis measurements (when applicable) are obtained utilizing NASCET criteria, using  the distal internal carotid diameter as the denominator. CONTRAST:  72mL OMNIPAQUE IOHEXOL 350 MG/ML SOLN COMPARISON:  None. FINDINGS: CTA NECK Aortic arch: Great vessel origins are incompletely included. Visualized arch is unremarkable. Right carotid system: Patent. There is no measurable stenosis at the ICA origin. Left carotid system: Common carotid origin is not included. Patent. Trace calcified plaque at the ICA origin without measurable stenosis. Vertebral arteries: Patent. Left vertebral artery is slightly dominant. Skeleton: Evidence of prior anterior fusion at C3-C4 with plate and screw fixation and well incorporated interbody graft. Degenerative changes of the cervical spine. Other neck: No mass or adenopathy. Upper chest: Included upper lungs are clear. Review of the MIP images confirms the above findings CTA HEAD Anterior circulation: Intracranial internal carotid arteries are patent. Anterior cerebral arteries are patent. 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 Middle cerebral arteries are patent. Posterior circulation: Intracranial vertebral arteries, basilar artery, and posterior cerebral arteries are patent. Venous sinuses: Patent as allowed by contrast bolus timing. Review of the MIP images confirms the above findings IMPRESSION: No large vessel occlusion or hemodynamically significant stenosis. Electronically Signed   By: Macy Mis M.D.   On: 10/17/2019 13:15    PHYSICAL EXAM Pleasant frail malnourished looking middle-aged Caucasian male not in distress. . Afebrile. Head is nontraumatic. Neck is supple without bruit.    Cardiac exam no murmur or gallop. Lungs are clear to auscultation. Distal pulses are well felt. Skin shows multiple petechiae in both extremities.  No definite rash. Neurological Exam :  Awake alert oriented to time place and person.  Diminished attention registration and recall.  Blinks to threat bilaterally.  Face is symmetric without weakness.  Extraocular movements are  full range without nystagmus.  Tongue midline.  Motor system exam shows left hemiparesis with left upper extremity to/5 strength in left lower extremity 1/5 strength.  Good purposeful antigravity movements in the right side.  Reflexes are symmetric brisk bilaterally.  Toes downgoing.  Gait not tested. ASSESSMENT/PLAN [name] is a 54 y.o. male with history of  GERD, PTSD, CKD stage IV, history of cervical radiculopathy, chronic pain syndrome, follows pain clinic presenting to [facility]  with L sided weakness.   Stroke:   B anterior and posterior circulation infarcts, embolic secondary to unknown source  Code Stroke CT head No acute abnormality. ASPECTS 10.     CTA head & neck no LVO, Unremarkable   MRI  Small B cerebral and cerebellar infarcts  2D Echo pending   LDL 26  HgbA1c 5.4  VTE prophylaxis - SCDs   No antithrombotic prior to admission (hx GIB in past), now on aspirin 325 mg daily - Stopped in setting of low PLTS and drop in Hgb  Therapy recommendations:  pending   Disposition:  pending - lived home alone PTA  Hyperlipidemia  Home meds:  No statin  Now on lipitor 40    LDL 26, goal < 70  D/c lipitor given LDL < 70  Continue statin at discharge  Other Stroke Risk Factors  Denies ETOH use, alcohol level <10  Denies substance abuse, UDS neg  Coronary artery disease s/p recent MI  Other Active Problems  GERD on PPI  PTSD  CKD stage IV w/ metabolic acidosis  UTI w/ leukocytosis on rocephin  Hx cervical radiculopathy  Chronic pain syndrome followed at pain clinic  Hx chronic anemia, likely anemia of chronic dz, Hgb 8.6->7.8  Falls w/ generalized debility, multiple bruises   Elevated troponins w/ hx CAD  Thrombocytopenia, PLT 81-10-31   Tick bite, removed in ED, on doxy   Hypokalemia  Hospital day # 1 He has presented with by cerebral embolic infarcts from undetermined source but does have thrombocytopenia, multiple  falls and bruises,, chronic anemia, history of GI bleeding in the past, and scattered petechiae on his extremities hence is not a good long-term anticoagulation candidate at the present time hence will defer doing TEE and loop recorder at the present time.  Continue ongoing stroke work-up check echocardiogram.  Lyme titer given tick bite.  Recommend single agent antiplatelet therapy aspirin 81 mg daily alone.  Long discussion with patient and his mother at the bedside as well as Dr. Loleta Books and answered questions.  Code stroke was canceled as patient was not a candidate for either TPA or thrombectomy based on his exam.  Greater than 50% time during this 35-minute visit was spent on counseling and coordination of care about his embolic strokes and discussion about evaluation treatment plan and answering questions Chase Contras, MD To contact Stroke Continuity provider, please refer to [email or web address] After hours, contact General Neurology

## 2019-10-18 NOTE — Consult Note (Signed)
Nephrology Consult   Requesting provider: Myrene Buddy Reason for consult: possible TTP   Assessment/Recommendations: SABA NEUMAN [name] is a/an 54 y.o. male with a past medical history of BPH, multiple episodes of UTI and obstruction, hodgkins lymphoma, CKD4, CAD, gastric bypass '96, cervical radiculopathy, ptds, chronic pain syndrome  notable for AKI    Thrombocytopenia -renal function stable and at baseline. LDH/hapto WNL. Less likely to be TTP as of right now -Check shiga toxin -adamts13 sent although can take a while to come back -will hold off on TPE for now especially since there is no overt evidence of MAHA. Upon review of smear by hemonc, if clear then can hold off, if present will likely initiate   CKD4, stable: -receives his care from the [place] system. Multiple episodes of urinary retention/obstruction in the past with AKI's -largely fluctuant baseline Cr of around 3-4.5 (2.7 back in 05/2018) -Continue to monitor daily Cr, Dose meds to egfr/crcl -Monitor Daily I/Os, Daily weight . ,maintain foley -Maintain MAP>65 for optimal renal perfusion.  - avoid further nephrotoxins including NSAIDS, Morphine.  Unless absolutely necessary, avoid CT with contrast and/or MRI with gadolinium.     Multifocal stroke -did not receive tpa (not a candidate bc of thrombocytopenia) -no aspirin bc of thrombocytopenia -echo to evaluate source  UTI with large bladder diverticulum: on rocephin, ucx pending. Will need urology as an outpatient  Tick Bite: on doxy  Volume Status: Appears hypovolemic on exam. Encouraged PO hydration, received IVF  Metabolic Acidosis, likely secondary to CKD4 -agree with nahco3. Increase to 168m TID  Anemia due to chronic disease: -Transfuse for Hgb<7 g/dL -check iron panel -hemolysis labs wnl, smear review per hemonc  CKD-BMD -monitor phos, check pth -phos restricted diet for now   Recommendations conveyed to primary service.   VGean Quint  MD [facility] 10/18/2019 3:38 PM  _____________________________________________________________________________________   History of Present Illness: Chase Schabenis a/an 54y.o. male with a past medical history as above [facility] is [facility] legs.  He was found to have a multifocal stroke on MRI.  Not a TPA candidate given thrombocytopenia which is new.  He is also been taken off her aspirin for the same reason.  He has had a recent admission back in May for AKI on CKD in the setting of urinary obstruction.  He does report diarrhea (about 3 times a day, nonbloody) which occurred in the last week and cannot pinpoint exact reason for i.e. the medication, new foods etc.   Medications:  Current Facility-Administered Medications  Medication Dose Route Frequency Provider Last Rate Last Admin  . acetaminophen (TYLENOL) tablet 650 mg  650 mg Oral Q4H PRN Rai, Ripudeep K, MD       Or  . acetaminophen (TYLENOL) 160 MG/5ML solution 650 mg  650 mg Per Tube Q4H PRN Rai, Ripudeep K, MD       Or  . acetaminophen (TYLENOL) suppository 650 mg  650 mg Rectal Q4H PRN Rai, Ripudeep K, MD      . aspirin EC tablet 81 mg  81 mg Oral Daily SLeonie Man Pramod S, MD      . cefTRIAXone (ROCEPHIN) 2 g in sodium chloride 0.9 % 100 mL IVPB  2 g Intravenous Q24H DEdwin Dada MD 200 mL/hr at 10/18/19 1331 2 g at 10/18/19 1331  . Chlorhexidine Gluconate Cloth 2 % PADS 6 each  6 each Topical Daily Rai, RVernelle Emerald MD  6 each at 10/18/19 0826  . doxycycline (VIBRAMYCIN) 100 mg in sodium chloride 0.9 % 250 mL IVPB  100 mg Intravenous Q12H Danford, Suann Larry, MD      . DULoxetine (CYMBALTA) DR capsule 30 mg  30 mg Oral QHS Rai, Ripudeep K, MD   30 mg at 10/17/19 2129  . DULoxetine (CYMBALTA) DR capsule 60 mg  60 mg Oral Daily Rai, Ripudeep K, MD   60 mg at 10/18/19 0831  . gabapentin (NEURONTIN) capsule 300 mg  300 mg Oral BID Vernelle Emerald,  MD   300 mg at 10/18/19 0825  . HYDROmorphone (DILAUDID) injection 0.5 mg  0.5 mg Intravenous Q4H PRN Rai, Ripudeep K, MD      . hydrOXYzine (ATARAX/VISTARIL) tablet 10 mg  10 mg Oral TID PRN Rai, Ripudeep K, MD      . midodrine (PROAMATINE) tablet 2.5 mg  2.5 mg Oral TID Rai, Ripudeep K, MD   2.5 mg at 10/18/19 1251  . pantoprazole (PROTONIX) EC tablet 40 mg  40 mg Oral BID AC Rai, Ripudeep K, MD   40 mg at 10/18/19 1506  . [START ON 10/19/2019] pneumococcal 23 valent vaccine (PNEUMOVAX-23) injection 0.5 mL  0.5 mL Intramuscular Tomorrow-1000 Rai, Ripudeep K, MD      . sodium bicarbonate tablet 325 mg  325 mg Oral TID Rai, Ripudeep K, MD   325 mg at 10/18/19 1506  . tamsulosin (FLOMAX) capsule 0.4 mg  0.4 mg Oral Daily Rai, Ripudeep K, MD   0.4 mg at 10/18/19 0825  . traMADol (ULTRAM) tablet 50 mg  50 mg Oral Q12H Rai, Ripudeep K, MD   50 mg at 10/18/19 0825     ALLERGIES Nsaids  MEDICAL HISTORY Past Medical History:  Diagnosis Date  . Chemical exposure    service related injury  . PTSD (post-traumatic stress disorder)   . Renal disorder      SOCIAL HISTORY Social History   Socioeconomic History  . Marital status: Widowed    Spouse name: Not on file  . Number of children: Not on file  . Years of education: Not on file  . Highest education level: Not on file  Occupational History  . Not on file  Tobacco Use  . Smoking status: Never Smoker  . Smokeless tobacco: Current User  Substance and Sexual Activity  . Alcohol use: Never  . Drug use: Never  . Sexual activity: Not on file  Other Topics Concern  . Not on file  Social History Narrative  . Not on file   Social Determinants of Health   Financial Resource Strain:   . Difficulty of Paying Living Expenses:   Food Insecurity:   . Worried About Charity fundraiser in the Last Year:   . Arboriculturist in the Last Year:   Transportation Needs:   . Film/video editor (Medical):   Marland Kitchen Lack of Transportation  (Non-Medical):   Physical Activity:   . Days of Exercise per Week:   . Minutes of Exercise per Session:   Stress:   . Feeling of Stress :   Social Connections:   . Frequency of Communication with Friends and Family:   . Frequency of Social Gatherings with Friends and Family:   . Attends Religious Services:   . Active Member of Clubs or Organizations:   . Attends Archivist Meetings:   Marland Kitchen Marital Status:   Intimate Partner Violence:   . Fear of Current or Ex-Partner:   .  Emotionally Abused:   Marland Kitchen Physically Abused:   . Sexually Abused:      FAMILY HISTORY History reviewed. No pertinent family history.   No family history of kidney disease  Review of Systems: 12 systems reviewed Otherwise as per HPI, all other systems reviewed and negative  Physical Exam: Vitals:   10/18/19 1300 10/18/19 1400  BP:    Pulse: 60 62  Resp: 11 12  Temp:    SpO2: 100% 100%   Total I/O In: 433.7 [P.O.:240; I.V.:193.7] Out: 500 [Urine:500]  Intake/Output Summary (Last 24 hours) at 10/18/2019 1538 Last data filed at 10/18/2019 5521 Gross per 24 hour  Intake 1594.43 ml  Output 1725 ml  Net -130.57 ml   General: Chronically ill-appearing, thin HEENT: anicteric sclera, oropharynx clear without lesions, no oral lesions CV: regular rate, normal rhythm, no murmurs, no gallops, no rubs Lungs: clear to auscultation bilaterally, normal work of breathing Abd: soft, non-tender, non-distended Skin: Petechial lesions which requires bilateral lower extremities Psych: alert, engaged, appropriate mood and affect Musculoskeletal: Trace edema bilateral lower extremities Neuro: normal speech, left-sided weakness  Test Results Reviewed Lab Results  Component Value Date   NA 135 10/18/2019   K 3.5 10/18/2019   CL 115 (H) 10/18/2019   CO2 14 (L) 10/18/2019   BUN 41 (H) 10/18/2019   CREATININE 3.52 (H) 10/18/2019   CALCIUM 7.4 (L) 10/18/2019   ALBUMIN 1.6 (L) 10/18/2019   PHOS 5.1 (H)  10/18/2019     I have reviewed all relevant outside healthcare records related to the patient's kidney injury.

## 2019-10-18 NOTE — Progress Notes (Signed)
Rehab Admissions Coordinator Note:  Patient was screened by Cleatrice Burke for appropriateness for an Inpatient Acute Rehab Consult per PT recs. .  At this time, we are recommending Inpatient Rehab consult. I will place order per protocol.  Cleatrice Burke RN MSN 10/18/2019, 4:25 PM  I can be reached at [phone].

## 2019-10-18 NOTE — Progress Notes (Signed)
PROGRESS NOTE    ATIF CHAPPLE [name]  QGB:[id number] DOB: January 19, 1966 DOA: 10/17/2019 PCP: [name]      Brief Narrative:  Mr. Poole is a 54 y.o. M with CKD IV baseline Cr 4.5 last January, hx Hodgkin Lymphoma 2010 s/p ABVD and radX completed 2011, hx gastric bypass 1996, PTSD and recent admission for AKI on CKD due to UTI in large bladder diverticulum at [facility] who presents with acute left sided weakness.  Went to bed normal, woke with left hemiparesis.  In the ER, CTA head and neck unremarkable.  Tick found in posterior neck.  MRI brain showed scattered bilateral hemisphere acute infarcts.        Assessment & Plan:  Acute thrombocytopenia DDx includes DIC (tickborne disease?), TTP.  I do not suspect liver disease because his hypoalbuminemia and thrombocytopenia are acute (platelets normal in May 2021, Albumin in 3s in 2021), but I will image liver.  No active bleeding at this time.  Normal fibrinogen militates against DIC.   -Consult Hematology, appreciate expert guidance -Trend platelets -Send smear to pathology -Obtain liver US    Chronic kidney disease stage V Acute metabolic acidosis Cr near recent baseline 4.5 in last January. -Consult Nephrology, appreciate cares -Continue Bicarb  Acute stroke, likely embolic, multifocal, bihemispheric Admitted with new left hemiparesis.  MRI showed numerous subcentimeter acute infarcts in bilateral hemispheres, multiple vascular territories.  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    Then in May 2021 was admitted to outside hospital with AKI on CKD, CT showed bilateral hydronephrosis and ?perineal mass.  On further workup, this mass appeared to be a diverticulum from the bladder.  Urology were consulted, he was taken for cystoscopy, and on cystoscopy, a large 300cc posterior bladder diverticulum was found, with purulent contents, culture growing multiple species.  Urology recommended outpatient follow up for possible diverticulectomy.  He was treated with ceftriaxone for a few days and discharged on oral cefdinir.  See Op note from outside hospital in "Procedures" section below  Here, he is complaining of his baseline urinary hesitancy and dysuria, and his urine again is growing bacteria. -Continue ceftriaxone -Follow urine culture -Continue Flomax -I do not feel this is his main clinical process, and I feel this diverticulectomy can be followed as an outpatient  Hypoalbuminemia This is acute.  No significant proteinuria on UA -Trend albumin  PTSD Chronic pain syndrome -Continue duloxetine, gabapentin, tramadol  Hx Hodgkin Lymphoma  Anemia of CKD and chronic B12 deficiency Hx gastric bypass Hgb trending down.  No clinical bleeding.  Tbili and LDH not suggestive of active hemolysis. -Trend Hgb -Resume B12 supplements at D/c  Stage I sacral pressure injury, POA  Severe protein calorie malnutrition As evidenced by severe loss of subcutaneous muscle mass and fat, chronic illness (Hodgkin's lymphoma), poor p.o. intake, and pressure injury.          Disposition: Status is: Inpatient  Remains inpatient appropriate because:Ongoing diagnostic testing needed not appropriate for outpatient work up   Dispo: The patient is from: Home  Anticipated d/c is to: SNF              Anticipated d/c date is: > 3 days              Patient currently is not medically stable to d/c.              MDM: The  below labs and imaging reports were reviewed and summarized above.  Medication management as above.  The patietn has an acute illness (possible TTP) pose threat to life, limb, bodily fcn, abrupt neuro change  DVT prophylaxis:  SCDs  Code Status: FULL Family Communication: mother at bedside    Consultants:   Neurology  Nephrology  Hematology  Procedures:     5/27 cystogram at [facility] Operative Note  Attending Surgeon: Perfecto Kingdom, MD  Resident/Assistant Surgeon:  None  Preoperative Diagnosis: AKI/HYDRONEPHROSIS/BPH WITH URINARY RETENTION, PELVIC MASS  Postoperative Diagnosis: SAA, BLADDER DIVERTICULUM  Procedure Performed: Procedure(s): CYSTOSCOPY, BILATERAL RGP, INTRA-OP CYSTOGRAM  Anesthesia: Choice  Intraprocedure I/O Totals   Intake  Propofol Drip 0.00 mL  The total shown is the total volume documented since Anesthesia Start was filed.  Lactated Ringers 800.00 mL  Total Intake 800 mL    Specimen(s) sent:  Order Name Source Comment Collection Info Order Time  URINE CULTURE Catheterized Intraoperative urine culture 08/26/2019 12:12 PM  Release to patient Manual release only   Drains: 20 [place] coud catheter  Implants: None  EBL: N/A  Complications: None  Disposition: PACU - hemodynamically stable.  Findings:  1. The anterior urethra was normal. 2. The posterior urethra revealed moderate trilobar hypertrophy. The median lobe was not severe. 3. Bilateral retrograde normal. 4. Significant bladder trabeculation. No tumor or stone. Capacious. 5. Pinpoint posterior midline diverticular os noted with milky discharge; cystogram and diverticulogram performed.  6. Diverticulum culture obtained.  Brief History: See preop notes.  Surgical Technique: After informed consent was obtained, the patient was taken the cystoscopy suite and placed upon the table. He was given heavy sedation anesthesia. He is on scheduled antibiotics. We performed a surgical  timeout. We have reviewed the informed consent.   Patient was placed in dorsolithotomy position sterilely prepped and draped per routine.  Rigid cystoscopy was performed. Bilateral retrograde pyelograms were performed with a 5 [place] open-ended catheter. They were benign.  I performed a cystogram per routine with 300 cc of contrast; images sent to PACS.  A pinpoint posteriorly located midline diverticular os was appreciated beyond the intratrigonal ridge. The os was intubated with a 5 [place] open-ended catheter and diverticulogram was performed. There was a large, posterior, midline, heavily trabeculated bladder diverticulum noted. Culture was obtained.  All rigid instruments were withdrawn. Bladder was drained with a 20 [place] coud catheter. Effluent was clear.  Perfecto Kingdom, MD       Antimicrobials:   Ceftriaxone 7/18 >>  Doxycycline 7/18 >>   Culture data:   7/18 Blood culture x2 -- no gorwth to date  7/18 Urine culture -- no growth          Subjective: No new rashes other than the petechiae on his legs which were there for the last several days.  He is very weak.  He had a period of right-sided vision loss a few minutes ago which was transitory.  No confusion, fever.  No vomiting.  He is still weak in the left side.  His butt hurts from his pressure wound.      Objective: Vitals:   10/18/19  0022 10/18/19 0354 10/18/19 0811 10/18/19 0900  BP: 116/71 101/67 116/73   Pulse: (!) 56 63 64 65  Resp: 10 20 16 14   Temp: 98.1 F (36.7 C) 97.7 F (36.5 C) 97.8 F (36.6 C)   TempSrc: Oral Oral Oral   SpO2: 99% 100% 100% 100%  Weight:      Height:        Intake/Output Summary (Last 24 hours) at 10/18/2019 1422 Last data filed at 10/18/2019 0952 Gross per 24 hour  Intake 1594.43 ml  Output 1725 ml  Net -130.57 ml   Filed Weights   10/17/19 1216  Weight: 55.3 kg    Examination: General appearance: Thin adult male, alert and in no acute  distress.   HEENT: Anicteric, conjunctiva pale, lids and lashes normal. No nasal deformity, discharge, epistaxis.  Lips moist, oropharynx tacky dry, no oral lesions, hearing normal.   Skin: Warm and dry.  No jaundice.  Petechiae on bilateral legs.  No lesions on the arms, abdomen, back, chest, head or neck.  No lesions on the palms. Cardiac: RRR, nl S1-S2, no murmurs appreciated.  Capillary refill is brisk.  JVP normal.  Plus bilateral LE edema.  Radial pulses 2+ and symmetric. Respiratory: Normal respiratory rate and rhythm.  CTAB without rales or wheezes. Abdomen: Abdomen soft.  No TTP or rebound or guarding. No ascites, distension, hepatosplenomegaly.   MSK: No deformities or effusions.  Severe diffuse loss of subcutaneous muscle mass and fat. Neuro: Awake and alert.  EOMI, mild left-sided hemiparesis, exam is confounded by diminished patient effort (his strength appears distractible). Speech fluent.    Psych: Sensorium intact and responding to questions, attention normal. Affect blunted.  Judgment and insight appear normal.    Data Reviewed: I have personally reviewed following labs and imaging studies:  CBC: Recent Labs  Lab 10/17/19 1241 10/17/19 1311 10/18/19 0013  WBC 18.8*  --  11.7*  NEUTROABS 17.7*  --  10.9*  HGB 8.6*  9.2*  --  7.8*  HCT 26.5*  27.0*  --  25.2*  MCV 89.5  --  89.0  PLT 69* 68* 61*   Basic Metabolic Panel: Recent Labs  Lab 10/17/19 1241 10/18/19 0013  NA 133*  137 135  K 3.1*  3.1* 3.5  CL 111  108 115*  CO2 13* 14*  GLUCOSE 82  79 88  BUN 42*  38* 41*  CREATININE 3.74*  4.10* 3.52*  CALCIUM 7.4* 7.4*  PHOS  --  5.1*   GFR: Estimated Creatinine Clearance: 18.5 mL/min (A) (by C-G formula based on SCr of 3.52 mg/dL (H)). Liver Function Tests: Recent Labs  Lab 10/17/19 1241 10/18/19 0013  AST 18  --   ALT 18  --   ALKPHOS 96  --   BILITOT 0.4  --   PROT 5.6*  --   ALBUMIN 2.0* 1.6*   No results for input(s): LIPASE, AMYLASE in  the last 168 hours. No results for input(s): AMMONIA in the last 168 hours. Coagulation Profile: Recent Labs  Lab 10/17/19 1241 10/17/19 1311  INR 1.5* 1.6*   Cardiac Enzymes: No results for input(s): CKTOTAL, CKMB, CKMBINDEX, TROPONINI in the last 168 hours. BNP (last 3 results) No results for input(s): PROBNP in the last 8760 hours. HbA1C: Recent Labs    10/18/19 0013  HGBA1C 5.4   CBG: Recent Labs  Lab 10/17/19 1212  GLUCAP 64*   Lipid Profile: Recent Labs    10/18/19 0013  CHOL 60  HDL  21*  LDLCALC 26  TRIG 67  CHOLHDL 2.9   Thyroid Function Tests: No results for input(s): TSH, T4TOTAL, FREET4, T3FREE, THYROIDAB in the last 72 hours. Anemia Panel: No results for input(s): VITAMINB12, FOLATE, FERRITIN, TIBC, IRON, RETICCTPCT in the last 72 hours. Urine analysis:    Component Value Date/Time   COLORURINE YELLOW 10/17/2019 1238   APPEARANCEUR CLOUDY (A) 10/17/2019 1238   LABSPEC 1.008 10/17/2019 1238   PHURINE 7.0 10/17/2019 1238   GLUCOSEU NEGATIVE 10/17/2019 1238   HGBUR LARGE (A) 10/17/2019 1238   BILIRUBINUR NEGATIVE 10/17/2019 1238   KETONESUR NEGATIVE 10/17/2019 1238   PROTEINUR 30 (A) 10/17/2019 1238   NITRITE NEGATIVE 10/17/2019 1238   LEUKOCYTESUR LARGE (A) 10/17/2019 1238   Sepsis Labs: @LABRCNTIP (procalcitonin:4,lacticacidven:4)  ) Recent Results (from the past 240 hour(s))  SARS Coronavirus 2 by RT PCR (hospital order, performed in [employer] hospital lab) Nasopharyngeal Nasopharyngeal Swab     Status: None   Collection Time: 10/17/19  1:14 PM   Specimen: Nasopharyngeal Swab  Result Value Ref Range Status   SARS Coronavirus 2 NEGATIVE NEGATIVE Final    Comment: (NOTE) SARS-CoV-2 target nucleic acids are NOT DETECTED.  The SARS-CoV-2 RNA is generally detectable in upper and lower respiratory specimens during the acute phase of infection. The lowest concentration of SARS-CoV-2 viral copies this assay can detect is 250 copies / mL. A  negative result does not preclude SARS-CoV-2 infection and should not be used as the sole basis for treatment or other patient management decisions.  A negative result may occur with improper specimen collection / handling, submission of specimen other than nasopharyngeal swab, presence of viral mutation(s) within the areas targeted by this assay, and inadequate number of viral copies (<250 copies / mL). A negative result must be combined with clinical observations, patient history, and epidemiological information.  Fact Sheet for Patients:   StrictlyIdeas.no  Fact Sheet for Healthcare Providers: BankingDealers.co.za  This test is not yet approved or  cleared by the [place] FDA and has been authorized for detection and/or diagnosis of SARS-CoV-2 by FDA under an Emergency Use Authorization (EUA).  This EUA will remain in effect (meaning this test can be used) for the duration of the COVID-19 declaration under Section 564(b)(1) of the Act, 21 U.S.C. section 360bbb-3(b)(1), unless the authorization is terminated or revoked sooner.  Performed at [facility], [address], [place], [place] [address]   Blood culture (routine x 2)     Status: None (Preliminary result)   Collection Time: 10/17/19  2:26 PM   Specimen: Left Antecubital; Blood  Result Value Ref Range Status   Specimen Description   Final    LEFT ANTECUBITAL BOTTLES DRAWN AEROBIC AND ANAEROBIC   Special Requests Blood Culture adequate volume  Final   Culture   Final    NO GROWTH < 24 HOURS Performed at [facility], [address], [place], [place] [address]    Report Status PENDING  Incomplete  Blood culture (routine x 2)     Status: None (Preliminary result)   Collection Time: 10/17/19  2:41 PM   Specimen: Right Antecubital; Blood  Result Value Ref Range Status   Specimen Description   Final    RIGHT ANTECUBITAL BOTTLES DRAWN AEROBIC AND ANAEROBIC   Special  Requests Blood Culture adequate volume  Final   Culture   Final    NO GROWTH < 24 HOURS Performed at [facility], [address] [place],  [address]    Report Status  PENDING  Incomplete         Radiology Studies: CT Angio Head W or Wo Contrast  Result Date: 10/17/2019 CLINICAL DATA:  [name] and slurred speech EXAM: CT ANGIOGRAPHY HEAD AND NECK TECHNIQUE: Multidetector CT imaging of the head and neck was performed using the standard protocol during bolus administration of intravenous contrast. Multiplanar CT image reconstructions and MIPs were obtained to evaluate the vascular anatomy. Carotid stenosis measurements (when applicable) are obtained utilizing NASCET criteria, using the distal internal carotid diameter as the denominator. CONTRAST:  79mL OMNIPAQUE IOHEXOL 350 MG/ML SOLN COMPARISON:  None. FINDINGS: CTA NECK Aortic arch: Great vessel origins are incompletely included. Visualized arch is unremarkable. Right carotid system: Patent. There is no measurable stenosis at the ICA origin. Left carotid system: Common carotid origin is not included. Patent. Trace calcified plaque at the ICA origin without measurable stenosis. Vertebral arteries: Patent. Left vertebral artery is slightly dominant. Skeleton: Evidence of prior anterior fusion at C3-C4 with plate and screw fixation and well incorporated interbody graft. Degenerative changes of the cervical spine. Other neck: No mass or adenopathy. Upper chest: Included upper lungs are clear. Review of the MIP images confirms the above findings CTA HEAD Anterior circulation: Intracranial internal carotid arteries are patent. Anterior cerebral arteries are patent. 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 Middle cerebral arteries are patent. Posterior circulation: Intracranial vertebral arteries, basilar artery, and posterior cerebral arteries are patent. Venous sinuses: Patent as allowed by contrast bolus timing. Review of the MIP images  confirms the above findings IMPRESSION: No large vessel occlusion or hemodynamically significant stenosis. Electronically Signed   By: Macy Mis M.D.   On: 10/17/2019 13:15   CT HEAD WO CONTRAST  Result Date: 10/18/2019 CLINICAL DATA:  Follow-up stroke. [name] and numbness. Scattered small infarctions by MRI. EXAM: CT HEAD WITHOUT CONTRAST TECHNIQUE: Contiguous axial images were obtained from the base of the skull through the vertex without intravenous contrast. COMPARISON:  10/17/2019 FINDINGS: Brain: No acute finding affecting the brainstem or cerebellum by CT. Cerebral hemispheres show generalized atrophy, somewhat premature for age. Subtle evidence cortical infarction in the right occipital lobe. No mass effect or hemorrhage. Other small infarctions seen by MRI not visible by CT. No hemorrhage, hydrocephalus or extra-axial collection. Vascular: There is atherosclerotic calcification of the major vessels at the base of the brain. Skull: Negative Sinuses/Orbits: Clear/normal Other: None IMPRESSION: Subtle evidence of cortical infarction in the right occipital region. No mass effect or hemorrhage. None of the other acute infarctions visible by MRI are visible on CT. Generalized volume loss, somewhat premature for age. Electronically Signed   By: Nelson Chimes M.D.   On: 10/18/2019 13:24   MR BRAIN WO CONTRAST  Result Date: 10/18/2019 CLINICAL DATA:  Stroke follow-up.  [name] and numbness. EXAM: MRI HEAD WITHOUT CONTRAST TECHNIQUE: Multiplanar, multiecho pulse sequences of the brain and surrounding structures were obtained without intravenous contrast. COMPARISON:  Head CT and CTA 10/17/2019 FINDINGS: Brain: There are subcentimeter acute infarcts in the cerebellum bilaterally. There is a small acute cortical infarct in the posteromedial right occipital lobe. There are multiple subcentimeter acute infarcts in the right corpus callosum, right cingulate gyrus, and right greater  than left frontal lobes. No intracranial hemorrhage, mass, midline shift, or extra-axial fluid collection is identified. The ventricles and sulci are normal. Vascular: Major intracranial vascular flow voids are preserved. Skull and upper cervical spine: No suspicious marrow lesion. Anterior fusion at C3-4. Sinuses/Orbits: Unremarkable orbits. Clear paranasal sinuses. Small right mastoid  effusion. Other: None. 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 Electronically Signed   By: Logan Bores M.D.   On: 10/18/2019 07:34   DG Chest Portable 1 View  Result Date: 10/17/2019 CLINICAL DATA:  Weakness, Reports went to bed last night today awakened and fell to the left sitting up - then fell out of bed In ED makes sffort to raise L arm, but cannot Cannot raise either R or L leg EXAM: PORTABLE CHEST 1 VIEW COMPARISON:  None. FINDINGS: The heart size and mediastinal contours are within normal limits. Low lung volumes. The lungs are clear. No pneumothorax or significant pleural effusion. No acute finding in the visualized skeleton. Prominent distended loops of bowel in the abdomen. IMPRESSION: No acute cardiopulmonary finding. Electronically Signed   By: Audie Pinto M.D.   On: 10/17/2019 13:59   CT HEAD CODE STROKE WO CONTRAST  Result Date: 10/17/2019 CLINICAL DATA:  Code stroke. Subacute deficit with left-sided weakness. EXAM: CT HEAD WITHOUT CONTRAST TECHNIQUE: Contiguous axial images were obtained from the base of the skull through the vertex without intravenous contrast. COMPARISON:  None. FINDINGS: Brain: No evidence of acute infarction, hemorrhage, hydrocephalus, extra-axial collection or mass lesion/mass effect. Vascular: Negative for hyperdense vessel Skull: Negative Sinuses/Orbits: Mild mucosal edema left maxillary sinus otherwise clear sinuses. Negative orbit. Other: None ASPECTS ([place] Stroke Program Early CT Score) - Ganglionic level  infarction (caudate, lentiform nuclei, internal capsule, insula, M1-M3 cortex): 7 - Supraganglionic infarction (M4-M6 cortex): 3 Total score (0-10 with 10 being normal): 10 IMPRESSION: 1. No acute abnormality 2. ASPECTS is 10 3. These results were called by telephone at the time of interpretation on 10/17/2019 at 12:56 pm to provider MATTHEW TRIFAN , who verbally acknowledged these results. Electronically Signed   By: Franchot Gallo M.D.   On: 10/17/2019 12:54   CT ANGIO NECK CODE STROKE  Result Date: 10/17/2019 CLINICAL DATA:  [name] and slurred speech EXAM: CT ANGIOGRAPHY HEAD AND NECK TECHNIQUE: Multidetector CT imaging of the head and neck was performed using the standard protocol during bolus administration of intravenous contrast. Multiplanar CT image reconstructions and MIPs were obtained to evaluate the vascular anatomy. Carotid stenosis measurements (when applicable) are obtained utilizing NASCET criteria, using the distal internal carotid diameter as the denominator. CONTRAST:  14mL OMNIPAQUE IOHEXOL 350 MG/ML SOLN COMPARISON:  None. FINDINGS: CTA NECK Aortic arch: Great vessel origins are incompletely included. Visualized arch is unremarkable. Right carotid system: Patent. There is no measurable stenosis at the ICA origin. Left carotid system: Common carotid origin is not included. Patent. Trace calcified plaque at the ICA origin without measurable stenosis. Vertebral arteries: Patent. Left vertebral artery is slightly dominant. Skeleton: Evidence of prior anterior fusion at C3-C4 with plate and screw fixation and well incorporated interbody graft. Degenerative changes of the cervical spine. Other neck: No mass or adenopathy. Upper chest: Included upper lungs are clear. Review of the MIP images confirms the above findings CTA HEAD Anterior circulation: Intracranial internal carotid arteries are patent. Anterior cerebral arteries are patent. 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 Middle  cerebral arteries are patent. Posterior circulation: Intracranial vertebral arteries, basilar artery, and posterior cerebral arteries are patent. Venous sinuses: Patent as allowed by contrast bolus timing. Review of the MIP images confirms the above findings IMPRESSION: No large vessel occlusion or hemodynamically significant stenosis. Electronically Signed   By: Macy Mis M.D.   On: 10/17/2019 13:15        Scheduled Meds: .  Chlorhexidine Gluconate Cloth  6 each Topical Daily  . DULoxetine  30 mg Oral QHS  . DULoxetine  60 mg Oral Daily  . gabapentin  300 mg Oral BID  . midodrine  2.5 mg Oral TID  . pantoprazole  40 mg Oral BID AC  . [START ON 10/19/2019] pneumococcal 23 valent vaccine  0.5 mL Intramuscular Tomorrow-1000  . sodium bicarbonate  325 mg Oral TID  . tamsulosin  0.4 mg Oral Daily  . traMADol  50 mg Oral Q12H   Continuous Infusions: . cefTRIAXone (ROCEPHIN)  IV 2 g (10/18/19 1331)  . doxycycline (VIBRAMYCIN) IV       LOS: 1 day    Time spent: 35 minutes    Edwin Dada, MD [facility] 10/18/2019, 2:22 PM     Please page though [place] or Epic secure chat:  For [employer], Adult nurse

## 2019-10-19 ENCOUNTER — Inpatient Hospital Stay (HOSPITAL_COMMUNITY): Payer: No Typology Code available for payment source

## 2019-10-19 DIAGNOSIS — I6389 Other cerebral infarction: Secondary | ICD-10-CM

## 2019-10-19 LAB — CBC
HCT: 23.1 % — ABNORMAL LOW (ref 39.0–52.0)
Hemoglobin: 7.3 g/dL — ABNORMAL LOW (ref 13.0–17.0)
MCH: 28.6 pg (ref 26.0–34.0)
MCHC: 31.6 g/dL (ref 30.0–36.0)
MCV: 90.6 fL (ref 80.0–100.0)
Platelets: 66 10*3/uL — ABNORMAL LOW (ref 150–400)
RBC: 2.55 MIL/uL — ABNORMAL LOW (ref 4.22–5.81)
RDW: 18 % — ABNORMAL HIGH (ref 11.5–15.5)
WBC: 7.8 10*3/uL (ref 4.0–10.5)
nRBC: 0 % (ref 0.0–0.2)

## 2019-10-19 LAB — COMPREHENSIVE METABOLIC PANEL
ALT: 19 U/L (ref 0–44)
AST: 24 U/L (ref 15–41)
Albumin: 1.6 g/dL — ABNORMAL LOW (ref 3.5–5.0)
Alkaline Phosphatase: 86 U/L (ref 38–126)
Anion gap: 5 (ref 5–15)
BUN: 37 mg/dL — ABNORMAL HIGH (ref 6–20)
CO2: 14 mmol/L — ABNORMAL LOW (ref 22–32)
Calcium: 7.3 mg/dL — ABNORMAL LOW (ref 8.9–10.3)
Chloride: 117 mmol/L — ABNORMAL HIGH (ref 98–111)
Creatinine, Ser: 3.41 mg/dL — ABNORMAL HIGH (ref 0.61–1.24)
GFR calc Af Amer: 22 mL/min — ABNORMAL LOW (ref >60)
GFR calc non Af Amer: 19 mL/min — ABNORMAL LOW (ref >60)
Glucose, Bld: 95 mg/dL (ref 70–99)
Potassium: 3.5 mmol/L (ref 3.5–5.1)
Sodium: 136 mmol/L (ref 135–145)
Total Bilirubin: 0.7 mg/dL (ref 0.3–1.2)
Total Protein: 4.6 g/dL — ABNORMAL LOW (ref 6.5–8.1)

## 2019-10-19 LAB — URINE CULTURE

## 2019-10-19 LAB — ADAMTS13 ACTIVITY: Adamts 13 Activity: 33.9 % — ABNORMAL LOW (ref >66.8)

## 2019-10-19 LAB — ADAMTS13 ACTIVITY REFLEX

## 2019-10-19 LAB — ECHOCARDIOGRAM COMPLETE
Area-P 1/2: 2.21 cm2
Calc EF: 60.2 %
Height: 68 in
S' Lateral: 2.8 cm
Single Plane A2C EF: 62.7 %
Single Plane A4C EF: 57.7 %
Weight: 1952 oz

## 2019-10-19 LAB — ROCKY MTN SPOTTED FVR ABS PNL(IGG+IGM)
RMSF IgG: NEGATIVE
RMSF IgM: 0.12 index (ref 0.00–0.89)

## 2019-10-19 LAB — B. BURGDORFI ANTIBODIES: B burgdorferi Ab IgG+IgM: <0.91 {ISR} (ref 0.00–0.90)

## 2019-10-19 LAB — PREPARE RBC (CROSSMATCH)

## 2019-10-19 MED ORDER — HYDROCODONE-ACETAMINOPHEN 5-325 MG PO TABS
1.0000 | ORAL_TABLET | ORAL | Status: DC | PRN
  Administered 2019-10-19 – 2019-10-26 (×34): 1 via ORAL
  Filled 2019-10-19 (×34): qty 1

## 2019-10-19 MED ORDER — SODIUM BICARBONATE 650 MG PO TABS
1300.0000 mg | ORAL_TABLET | Freq: Three times a day (TID) | ORAL | Status: DC
  Administered 2019-10-19 – 2019-10-22 (×9): 1300 mg via ORAL
  Filled 2019-10-19 (×11): qty 2

## 2019-10-19 MED ORDER — SODIUM CHLORIDE 0.9% IV SOLUTION: Freq: Once | INTRAVENOUS | Status: AC

## 2019-10-19 NOTE — CV Procedure (Signed)
2D echo attempted, but patient OTF. Will try later

## 2019-10-19 NOTE — Progress Notes (Signed)
Physical Therapy Treatment Patient Details Name: Chase [name] MRN: [id number] DOB: June 30, 1965 Today's Date: 10/19/2019    History of Present Illness Pt is a 54 y.o. male with PMH of cervical post-laminectomy syndrome, arthalgia of multiple sites, hodgkin's disease, PTSD, CKD, chronic pain syndrome, and anemia presenting to ED with L sided weakness, slurred speech, and fall. 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    PT Comments    Pt progressing towards physical therapy goals. Was able to demonstrate some active movement in the LLE this session and noted LUE movement is slightly improved today as well. +2 mod assist required for ambulation to/from recliner chair placed across the room. With MMT, pt not able to demonstrate active movement of the LLE, however we were able to elicit quad activation with eccentric exercise. Continue to recommend CIR level follow up at d/c.    Follow Up Recommendations  CIR     Equipment Recommendations  None recommended by PT (TBD by next venue of care)    Recommendations for Other Services Rehab consult     Precautions / Restrictions Precautions Precautions: Fall Restrictions Weight Bearing Restrictions: No    Mobility  Bed Mobility Overal bed mobility: Needs Assistance Bed Mobility: Supine to Sit     Supine to sit: Max assist;+2 for physical assistance     General bed mobility comments: To elevated trunk and advance lower legs off EOB  Transfers Overall transfer level: Needs assistance Equipment used: 2 person hand held assist Transfers: Sit to/from Stand Sit to Stand: Mod assist;+2 physical assistance         General transfer comment: Mod A due to decreased movement and strength on L side  Ambulation/Gait Ambulation/Gait assistance: Mod assist;+2 physical assistance Gait Distance (Feet): 10 Feet (5' x2) Assistive device: 2 person hand held assist Gait Pattern/deviations: Step-to pattern;Decreased  stride length;Trunk flexed;Decreased weight shift to left;Decreased stance time - left;Decreased dorsiflexion - left Gait velocity: Decreased Gait velocity interpretation: <1.31 ft/sec, indicative of household ambulator General Gait Details: Ambulated to chair (5' away from bed) and back to bed after seated rest. Therapist facilitated weight shift and used foot to advance LLE. When returning to the bed, pt was able to advance the LLE some without assist from therapist.    Stairs             Wheelchair Mobility    Modified Rankin (Stroke Patients Only) Modified Rankin (Stroke Patients Only) Pre-Morbid Rankin Score: Moderate disability Modified Rankin: Moderately severe disability     Balance Overall balance assessment: Needs assistance Sitting-balance support: Bilateral upper extremity supported;Feet supported Sitting balance-Leahy Scale: Poor Sitting balance - Comments: sitting EOB with poor balance with posterior and right lean Mod-min guard to correct  Postural control: Posterior lean;Right lateral lean Standing balance support: Bilateral upper extremity supported;During functional activity Standing balance-Leahy Scale: Poor Standing balance comment: Not able to sit without UE support or external support from therapist                            Cognition Arousal/Alertness: Lethargic Behavior During Therapy: Flat affect Overall Cognitive Status: No family/caregiver present to determine baseline cognitive functioning Area of Impairment: Problem solving;Awareness;Following commands;Attention                   Current Attention Level: Selective   Following Commands: Follows one step commands consistently;Follows one step commands with increased time   Awareness: Emergent Problem Solving: Slow  processing;Decreased initiation;Difficulty sequencing;Requires verbal cues;Requires tactile cues General Comments: Pt A&Ox4      Exercises General Exercises -  Upper Extremity Elbow Flexion: AAROM;10 reps Elbow Extension: AAROM;10 reps Wrist Flexion: AAROM;10 reps Wrist Extension: AAROM;10 reps General Exercises - Lower Extremity Long Arc Quad: AAROM;5 reps    General Comments        Pertinent Vitals/Pain Pain Assessment: Faces Faces Pain Scale: Hurts even more Pain Location: ulcer on sacrum Pain Descriptors / Indicators: Discomfort;Grimacing;Guarding;Tender    Home Living                      Prior Function            PT Goals (current goals can now be found in the care plan section) Acute Rehab PT Goals Patient Stated Goal: Not have to depend on anyone  PT Goal Formulation: With patient Time For Goal Achievement: 11/01/19 Potential to Achieve Goals: Good Progress towards PT goals: Progressing toward goals    Frequency    Min 4X/week      PT Plan Current plan remains appropriate    Co-evaluation              AM-PAC PT "6 Clicks" Mobility   Outcome Measure  Help needed turning from your back to your side while in a flat bed without using bedrails?: A Little Help needed moving from lying on your back to sitting on the side of a flat bed without using bedrails?: A Lot Help needed moving to and from a bed to a chair (including a wheelchair)?: Total Help needed standing up from a chair using your arms (e.g., wheelchair or bedside chair)?: A Lot Help needed to walk in hospital room?: Total Help needed climbing 3-5 steps with a railing? : Total 6 Click Score: 10    End of Session Equipment Utilized During Treatment: Gait belt Activity Tolerance: Patient limited by fatigue Patient left: in chair;with call bell/phone within reach;with chair alarm set Nurse Communication: Mobility status PT Visit Diagnosis: Unsteadiness on feet (R26.81);Hemiplegia and hemiparesis Hemiplegia - Right/Left: Left Hemiplegia - dominant/non-dominant: Non-dominant Hemiplegia - caused by: Cerebral infarction     Time:  0272-5366 PT Time Calculation (min) (ACUTE ONLY): 38 min  Charges:  $Gait Training: 38-52 mins                     Rolinda Roan, PT, DPT Acute Rehabilitation Services Pager: [phone] Office: [phone]    Thelma Comp 10/19/2019, 1:10 PM

## 2019-10-19 NOTE — Progress Notes (Signed)
STROKE TEAM PROGRESS NOTE   INTERVAL HISTORY Patient is working with physical therapy and sitting in bedside.  He continues to have significant left leg weakness and but is able to move left arm a little bit.  Is getting iron transfusion.  He is scheduled to go for ultrasound of the abdomen to look at his spleen for splenomegaly.  Echocardiogram is yet pending  Vitals:   10/18/19 1400 10/18/19 1639 10/18/19 1959 10/19/19 0733  BP:  113/77 121/75 124/78  Pulse: 62 68 69 63  Resp: 12 16 13 11   Temp:  97.9 F (36.6 C)  97.6 F (36.4 C)  TempSrc:  Oral  Oral  SpO2: 100% 100% 98% 100%  Weight:      Height:       CBC:  Recent Labs  Lab 10/17/19 1241 10/17/19 1311 10/18/19 0013 10/19/19 0336  WBC 18.8*   < > 11.7* 7.8  NEUTROABS 17.7*  --  10.9*  --   HGB 8.6*  9.2*   < > 7.8* 7.3*  HCT 26.5*  27.0*   < > 25.2* 23.1*  MCV 89.5   < > 89.0 90.6  PLT 69*   < > 61* 66*   < > = values in this interval not displayed.   Basic Metabolic Panel:  Recent Labs  Lab 10/18/19 0013 10/19/19 0336  NA 135 136  K 3.5 3.5  CL 115* 117*  CO2 14* 14*  GLUCOSE 88 95  BUN 41* 37*  CREATININE 3.52* 3.41*  CALCIUM 7.4* 7.3*  PHOS 5.1*  --    Lipid Panel:  Recent Labs  Lab 10/18/19 0013  CHOL 60  TRIG 67  HDL 21*  CHOLHDL 2.9  VLDL 13  LDLCALC 26   HgbA1c:  Recent Labs  Lab 10/18/19 0013  HGBA1C 5.4   Urine Drug Screen:  Recent Labs  Lab 10/17/19 1238  LABOPIA NONE DETECTED  COCAINSCRNUR NONE DETECTED  LABBENZ NONE DETECTED  AMPHETMU NONE DETECTED  THCU NONE DETECTED  LABBARB NONE DETECTED    Alcohol Level  Recent Labs  Lab 10/17/19 1241  ETH <10    IMAGING past 24 hours CT HEAD WO CONTRAST  Result Date: 10/18/2019 CLINICAL DATA:  Follow-up stroke. [name] and numbness. Scattered small infarctions by MRI. EXAM: CT HEAD WITHOUT CONTRAST TECHNIQUE: Contiguous axial images were obtained from the base of the skull through the vertex without intravenous  contrast. COMPARISON:  10/17/2019 FINDINGS: Brain: No acute finding affecting the brainstem or cerebellum by CT. Cerebral hemispheres show generalized atrophy, somewhat premature for age. Subtle evidence cortical infarction in the right occipital lobe. No mass effect or hemorrhage. Other small infarctions seen by MRI not visible by CT. No hemorrhage, hydrocephalus or extra-axial collection. Vascular: There is atherosclerotic calcification of the major vessels at the base of the brain. Skull: Negative Sinuses/Orbits: Clear/normal Other: None IMPRESSION: Subtle evidence of cortical infarction in the right occipital region. No mass effect or hemorrhage. None of the other acute infarctions visible by MRI are visible on CT. Generalized volume loss, somewhat premature for age. Electronically Signed   By: Nelson Chimes M.D.   On: 10/18/2019 13:24    PHYSICAL EXAM    Pleasant frail malnourished looking middle-aged Caucasian male not in distress. . Afebrile. Head is nontraumatic. Neck is supple without bruit.    Cardiac exam no murmur or gallop. Lungs are clear to auscultation. Distal pulses are well felt. Skin shows multiple petechiae in both extremities.  No definite rash.  ASSESSMENT/PLAN [name] is a 54 y.o. male with history of  GERD, PTSD, CKD stage IV, history of cervical radiculopathy, chronic pain syndrome, follows pain clinic presenting to [facility]  with L sided weakness.  Neurological Exam :  Awake alert oriented to time place and person.  Diminished attention registration and recall.  Blinks to threat bilaterally.  Face is symmetric without weakness.  Extraocular movements are full range without nystagmus.  Tongue midline.  Motor system exam shows left hemiparesis with left upper extremity to/5 strength in left lower extremity 1/5 strength.  Good purposeful antigravity movements in the right side.  Reflexes are symmetric brisk bilaterally.  Toes downgoing.  Gait not  tested. Stroke:   B anterior and posterior circulation infarcts, embolic secondary to unknown source  Code Stroke CT head No acute abnormality. ASPECTS 10.     CTA head & neck no LVO, Unremarkable   MRI  Small B cerebral and cerebellar infarcts  2D Echo pending  LDL 26  HgbA1c 5.4  VTE prophylaxis - SCDs   No antithrombotic prior to admission (hx GIB in past), now on aspirin 325 mg daily - Stopped in setting of low PLTS and drop in Hgb  Therapy recommendations:  CIR  Disposition:  pending - lived home alone PTA  Hyperlipidemia  Home meds:  No statin  Now on lipitor 40    LDL 26, goal < 70  D/c lipitor given LDL < 70  Continue statin at discharge  Other Stroke Risk Factors  Denies ETOH use, alcohol level <10  Denies substance abuse, UDS neg  Coronary artery disease s/p recent MI  Other Active Problems  GERD on PPI  PTSD  CKD stage IV w/ metabolic acidosis, not on dialysis  UTI w/ leukocytosis on rocephin, chronic retention w/ large bladder diverticulum  Hx cervical radiculopathy  Chronic pain syndrome followed at pain clinic  Hx anemia of chronic dz & chronic B12 deficiency, Hgb 8.6->7.8->7.3  Falls w/ generalized debility, multiple bruises   Elevated troponins w/ hx CAD  Thrombocytopenia, PLT 69-68-61-66   Tick bite, removed in ED, on doxy   Hypokalemia  On enteric precautions, Rule out c diff   Hypoalbuminemia  Severe protein calorie malnutrition  Hx Hodgkins Lymphoma  Stage I sacral injury  Hospital day # 2    He has presented with by cerebral embolic infarcts from undetermined source but does have thrombocytopenia, multiple falls and bruises,, chronic anemia, history of GI bleeding in the past, and scattered petechiae on his extremities hence is not a good long-term anticoagulation candidate at the present time hence will defer doing TEE and loop recorder at the present time.  Continue ongoing stroke work-up check echocardiogram.    Recommend single agent antiplatelet therapy aspirin 81 mg daily alone.  Long discussion with patient and  Dr. Loleta Books and answered questions.  Stroke team will sign off.  Kindly call for questions.  Greater than 50% time during this 25-minute visit was spent on counseling and coordination of care about his embolic strokes and discussion about evaluation treatment plan and answering questions Antony Contras, MD To contact Stroke Continuity provider, please refer to [email or web address] After hours, contact General Neurology

## 2019-10-19 NOTE — Progress Notes (Addendum)
Pt states that his indwelling foley was placed two weeks from 10/19/2019 at [facility]. Per Neph note, states a May 2021 placement of foley.

## 2019-10-19 NOTE — Progress Notes (Signed)
HEMATOLOGY-ONCOLOGY PROGRESS NOTE  SUBJECTIVE: Continues to have ongoing weakness and had transient right sided vision loss.  No bleeding reported.  REVIEW OF SYSTEMS:   Constitutional: Reports generalized weakness Respiratory: Denies cough, dyspnea or wheezes Cardiovascular: Denies palpitation, chest discomfort Gastrointestinal:  Denies nausea, heartburn or change in bowel habits Skin: Has ongoing rash to his bilateral lower extremities Lymphatics: Denies new lymphadenopathy Neurological:Denies numbness, tingling or new weaknesses Behavioral/Psych: Mood is stable, no new changes  Extremities: Still with bilateral lower extremity edema All other systems were reviewed with the patient and are negative.  I have reviewed the past medical history, past surgical history, social history and family history with the patient and they are unchanged from previous note.   PHYSICAL EXAMINATION:  Vitals:   10/19/19 1136 10/19/19 1207  BP: 121/81 117/78  Pulse: 62 65  Resp: 12 12  Temp: 97.9 F (36.6 C) 98.4 F (36.9 C)  SpO2: 100%    Filed Weights   10/17/19 1216  Weight: 55.3 kg    Intake/Output from previous day: 07/19 0701 - 07/20 0700 In: 673.7 [P.O.:480; I.V.:193.7] Out: 1350 [Urine:1350]  GENERAL:alert, no distress and comfortable SKIN: skin color, texture, turgor are normal, no rashes or significant lesions EYES: normal, Conjunctiva are pink and non-injected, sclera clear OROPHARYNX:no exudate, no erythema and lips, buccal mucosa, and tongue normal  NECK: supple, thyroid normal size, non-tender, without nodularity LYMPH:  no palpable lymphadenopathy in the cervical, axillary or inguinal LUNGS: clear to auscultation and percussion with normal breathing effort HEART: regular rate & rhythm and no murmurs and no lower extremity edema ABDOMEN:abdomen soft, non-tender and normal bowel sounds Musculoskeletal:no cyanosis of digits and no clubbing  NEURO: alert & oriented x 3 with  fluent speech, no focal motor/sensory deficits  LABORATORY DATA:  I have reviewed the data as listed CMP Latest Ref Rng & Units 10/19/2019 10/18/2019 10/17/2019  Glucose 70 - 99 mg/dL 95 88 82  BUN 6 - 20 mg/dL 37(H) 41(H) 42(H)  Creatinine 0.61 - 1.24 mg/dL 3.41(H) 3.52(H) 3.74(H)  Sodium 135 - 145 mmol/L 136 135 133(L)  Potassium 3.5 - 5.1 mmol/L 3.5 3.5 3.1(L)  Chloride 98 - 111 mmol/L 117(H) 115(H) 111  CO2 22 - 32 mmol/L 14(L) 14(L) 13(L)  Calcium 8.9 - 10.3 mg/dL 7.3(L) 7.4(L) 7.4(L)  Total Protein 6.5 - 8.1 g/dL 4.6(L) - 5.6(L)  Total Bilirubin 0.3 - 1.2 mg/dL 0.7 - 0.4  Alkaline Phos 38 - 126 U/L 86 - 96  AST 15 - 41 U/L 24 - 18  ALT 0 - 44 U/L 19 - 18    Lab Results  Component Value Date   WBC 7.8 10/19/2019   HGB 7.3 (L) 10/19/2019   HCT 23.1 (L) 10/19/2019   MCV 90.6 10/19/2019   PLT 66 (L) 10/19/2019   NEUTROABS 10.9 (H) 10/18/2019    CT Angio Head W or Wo Contrast  Result Date: 10/17/2019 CLINICAL DATA:  [name] and slurred speech EXAM: CT ANGIOGRAPHY HEAD AND NECK TECHNIQUE: Multidetector CT imaging of the head and neck was performed using the standard protocol during bolus administration of intravenous contrast. Multiplanar CT image reconstructions and MIPs were obtained to evaluate the vascular anatomy. Carotid stenosis measurements (when applicable) are obtained utilizing NASCET criteria, using the distal internal carotid diameter as the denominator. CONTRAST:  82mL OMNIPAQUE IOHEXOL 350 MG/ML SOLN COMPARISON:  None. FINDINGS: CTA NECK Aortic arch: Great vessel origins are incompletely included. Visualized arch is unremarkable. Right carotid system: Patent. There is no measurable  stenosis at the ICA origin. Left carotid system: Common carotid origin is not included. Patent. Trace calcified plaque at the ICA origin without measurable stenosis. Vertebral arteries: Patent. Left vertebral artery is slightly dominant. Skeleton: Evidence of prior anterior fusion at  C3-C4 with plate and screw fixation and well incorporated interbody graft. Degenerative changes of the cervical spine. Other neck: No mass or adenopathy. Upper chest: Included upper lungs are clear. Review of the MIP images confirms the above findings CTA HEAD Anterior circulation: Intracranial internal carotid arteries are patent. Anterior cerebral arteries are patent. 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 Middle cerebral arteries are patent. Posterior circulation: Intracranial vertebral arteries, basilar artery, and posterior cerebral arteries are patent. Venous sinuses: Patent as allowed by contrast bolus timing. Review of the MIP images confirms the above findings IMPRESSION: No large vessel occlusion or hemodynamically significant stenosis. Electronically Signed   By: Macy Mis M.D.   On: 10/17/2019 13:15   CT HEAD WO CONTRAST  Result Date: 10/18/2019 CLINICAL DATA:  Follow-up stroke. [name] and numbness. Scattered small infarctions by MRI. EXAM: CT HEAD WITHOUT CONTRAST TECHNIQUE: Contiguous axial images were obtained from the base of the skull through the vertex without intravenous contrast. COMPARISON:  10/17/2019 FINDINGS: Brain: No acute finding affecting the brainstem or cerebellum by CT. Cerebral hemispheres show generalized atrophy, somewhat premature for age. Subtle evidence cortical infarction in the right occipital lobe. No mass effect or hemorrhage. Other small infarctions seen by MRI not visible by CT. No hemorrhage, hydrocephalus or extra-axial collection. Vascular: There is atherosclerotic calcification of the major vessels at the base of the brain. Skull: Negative Sinuses/Orbits: Clear/normal Other: None IMPRESSION: Subtle evidence of cortical infarction in the right occipital region. No mass effect or hemorrhage. None of the other acute infarctions visible by MRI are visible on CT. Generalized volume loss, somewhat premature for age. Electronically Signed   By: Nelson Chimes  M.D.   On: 10/18/2019 13:24   MR BRAIN WO CONTRAST  Result Date: 10/18/2019 CLINICAL DATA:  Stroke follow-up.  [name] and numbness. EXAM: MRI HEAD WITHOUT CONTRAST TECHNIQUE: Multiplanar, multiecho pulse sequences of the brain and surrounding structures were obtained without intravenous contrast. COMPARISON:  Head CT and CTA 10/17/2019 FINDINGS: Brain: There are subcentimeter acute infarcts in the cerebellum bilaterally. There is a small acute cortical infarct in the posteromedial right occipital lobe. There are multiple subcentimeter acute infarcts in the right corpus callosum, right cingulate gyrus, and right greater than left frontal lobes. No intracranial hemorrhage, mass, midline shift, or extra-axial fluid collection is identified. The ventricles and sulci are normal. Vascular: Major intracranial vascular flow voids are preserved. Skull and upper cervical spine: No suspicious marrow lesion. Anterior fusion at C3-4. Sinuses/Orbits: Unremarkable orbits. Clear paranasal sinuses. Small right mastoid effusion. Other: None. 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 Electronically Signed   By: Logan Bores M.D.   On: 10/18/2019 07:34   US Abdomen Complete  Result Date: 10/19/2019 CLINICAL DATA:  Thrombocytopenia.  Chronic renal disease. EXAM: ABDOMEN ULTRASOUND COMPLETE COMPARISON:  No prior. FINDINGS: Gallbladder: Not visualized. Common bile duct: Diameter: Not visualized. Liver: Left lobe not visualized. Heterogeneous parenchymal pattern where visualized. No focal hepatic mass identified. Portal vein is patent on color Doppler imaging with normal direction of blood flow towards the liver. IVC: Not visualized. Pancreas: Visualized portion unremarkable. Spleen: Size and appearance within normal limits. Spleen measures 11.2 cm. Right Kidney: Length: 9.5 cm. Increased echogenicity consistent chronic medical  renal disease. Mild right  hydronephrosis. No mass noted. Left Kidney: Length: 10.4 cm. Increased echogenicity consistent chronic medical renal disease. Mild left hydronephrosis. No mass noted. Small amount of perinephric fluid. Abdominal aorta: Poorly visualized. Other findings: Ascites and bilateral pleural effusions noted. This was an extremely limited exam due to overlying bowel gas. IMPRESSION: 1. Extremely limited exam due to overlying bowel gas. Gallbladder and common bile duct not visualized. Left hepatic lobe not visualized. 2. Liver has a heterogeneous parenchymal pattern suggesting the possibility of fatty infiltration or hepatocellular disease. Spleen is normal in size. 3. Increased echogenicity both kidneys consistent chronic medical renal disease. Mild bilateral hydronephrosis. Small amount of left perinephric fluid noted. 4.  Ascites and bilateral pleural effusions noted. Electronically Signed   By: Marcello Moores  Register   On: 10/19/2019 13:43   DG Chest Portable 1 View  Result Date: 10/17/2019 CLINICAL DATA:  Weakness, Reports went to bed last night today awakened and fell to the left sitting up - then fell out of bed In ED makes sffort to raise L arm, but cannot Cannot raise either R or L leg EXAM: PORTABLE CHEST 1 VIEW COMPARISON:  None. FINDINGS: The heart size and mediastinal contours are within normal limits. Low lung volumes. The lungs are clear. No pneumothorax or significant pleural effusion. No acute finding in the visualized skeleton. Prominent distended loops of bowel in the abdomen. IMPRESSION: No acute cardiopulmonary finding. Electronically Signed   By: Audie Pinto M.D.   On: 10/17/2019 13:59   CT HEAD CODE STROKE WO CONTRAST  Result Date: 10/17/2019 CLINICAL DATA:  Code stroke. Subacute deficit with left-sided weakness. EXAM: CT HEAD WITHOUT CONTRAST TECHNIQUE: Contiguous axial images were obtained from the base of the skull through the vertex without intravenous contrast. COMPARISON:  None. FINDINGS:  Brain: No evidence of acute infarction, hemorrhage, hydrocephalus, extra-axial collection or mass lesion/mass effect. Vascular: Negative for hyperdense vessel Skull: Negative Sinuses/Orbits: Mild mucosal edema left maxillary sinus otherwise clear sinuses. Negative orbit. Other: None ASPECTS ([place] Stroke Program Early CT Score) - Ganglionic level infarction (caudate, lentiform nuclei, internal capsule, insula, M1-M3 cortex): 7 - Supraganglionic infarction (M4-M6 cortex): 3 Total score (0-10 with 10 being normal): 10 IMPRESSION: 1. No acute abnormality 2. ASPECTS is 10 3. These results were called by telephone at the time of interpretation on 10/17/2019 at 12:56 pm to provider MATTHEW TRIFAN , who verbally acknowledged these results. Electronically Signed   By: Franchot Gallo M.D.   On: 10/17/2019 12:54   CT ANGIO NECK CODE STROKE  Result Date: 10/17/2019 CLINICAL DATA:  [name] and slurred speech EXAM: CT ANGIOGRAPHY HEAD AND NECK TECHNIQUE: Multidetector CT imaging of the head and neck was performed using the standard protocol during bolus administration of intravenous contrast. Multiplanar CT image reconstructions and MIPs were obtained to evaluate the vascular anatomy. Carotid stenosis measurements (when applicable) are obtained utilizing NASCET criteria, using the distal internal carotid diameter as the denominator. CONTRAST:  64mL OMNIPAQUE IOHEXOL 350 MG/ML SOLN COMPARISON:  None. FINDINGS: CTA NECK Aortic arch: Great vessel origins are incompletely included. Visualized arch is unremarkable. Right carotid system: Patent. There is no measurable stenosis at the ICA origin. Left carotid system: Common carotid origin is not included. Patent. Trace calcified plaque at the ICA origin without measurable stenosis. Vertebral arteries: Patent. Left vertebral artery is slightly dominant. Skeleton: Evidence of prior anterior fusion at C3-C4 with plate and screw fixation and well incorporated interbody  graft. Degenerative changes of the cervical spine. Other neck:  No mass or adenopathy. Upper chest: Included upper lungs are clear. Review of the MIP images confirms the above findings CTA HEAD Anterior circulation: Intracranial internal carotid arteries are patent. Anterior cerebral arteries are patent. 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 Middle cerebral arteries are patent. Posterior circulation: Intracranial vertebral arteries, basilar artery, and posterior cerebral arteries are patent. Venous sinuses: Patent as allowed by contrast bolus timing. Review of the MIP images confirms the above findings IMPRESSION: No large vessel occlusion or hemodynamically significant stenosis. Electronically Signed   By: Macy Mis M.D.   On: 10/17/2019 13:15    ASSESSMENT AND PLAN: This is a very pleasant 54 year old white male with multiple medical problems including history of Hodgkin lymphoma treated in 2010 at [facility] by Dr. Georgiann Cocker.  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  He was found after admission to have thrombocytopenia and his platelet count has been over 60,000. The patient also has a history of chronic renal insufficiency and frequent urinary tract infection secondary to benign prostatic hypertrophy and obstruction.  He is currently on several medications for his stroke as well as infection. Review of the peripheral blood smear showed no significant amount of schistocytes and the patient has decreased platelets count. DIC panel showed no significant decline in fibrinogen.  His haptoglobin and LDH are within the normal range.  Coombs test negative.  Reticulocytes not elevated. I doubt the patient has TTP based on the above findings. ADAMTS 13 has been ordered for confirmation and this is pending. I think his thrombocytopenia is either drug-induced or secondary to underlying infection.  Platelet count today is up slightly to  66,000.  He is not actively bleeding.  Recommend continued monitoring and transfuse platelets for platelet count less than 20,000.  His hemoglobin has drifted down to 7.3 today.  He is symptomatic.  He received 1 unit PRBCs today.     LOS: 2 days   Mikey Bussing, DNP, AGPCNP-BC, AOCNP 10/19/19

## 2019-10-19 NOTE — Progress Notes (Signed)
  Echocardiogram 2D Echocardiogram has been performed.  Chase [name] 10/19/2019, 1:47 PM

## 2019-10-19 NOTE — Progress Notes (Signed)
SLP Cancellation Note  Patient Details Name: Chase [name] MRN: [id number] DOB: [birth year]   Cancelled treatment:       Reason Eval/Treat Not Completed: Patient at procedure or test/unavailable (Pt off unit for [place]. SLP will follow up. )  Mirabelle Cyphers I. Hardin Negus, [place], [place] Office number [phone] Pager Marks 10/19/2019, 10:06 AM

## 2019-10-19 NOTE — Progress Notes (Signed)
PROGRESS NOTE    Chase [name]  MBW:[id number] DOB: 01-Jan-1966 DOA: 10/17/2019 PCP: [name]      Brief Narrative:  Chase [name] is a 54 y.o. M with CKD IV, hx Hodgkin Lymphoma 2010, hx gastric bypass 1996, PTSD who presented with acute left sided weakness.  Patient recently admitted to [facility] for AKI on CKD (Cr >5 on admission) due to UTI.  Noted there to have large bladder diverticulum, treated with fluids and antibiotiics and Cr improved to 3.5, discharged home and was recuperating at home when on day of admission here he woke with new left hemiparesis.  In the ER, CTA head and neck unremarkable.  MRI brain showed scattered bilateral hemisphere acute infarcts.  Tick found in posterior neck.  New acute thrombocytopenia noted.        Assessment & Plan:  Acute thrombocytopenia Platelets stable around 60,000 without evidence of bleeding.  Hematology and nephrology consulted, appreciate their differential.  TTP/HUS relatively less likely that thrombocytopenia from his underlying infection (tickborne disease is suspected, UTI in his bladder diverticulum may also be present).  Medication induced thrombocytopenia unlikely given thrombocytopenia was present on admission.  Platelet deficiency is acute, doubt liver disease but will image liver/spleen.  -Consult hematology, nephrology, appreciate expert guidance -Trend platelet count -Follow-up pathology smear review -Follow liver ultrasound    Chronic kidney disease stage V, not yet on dialysis Metabolic acidosis, likely chronic, present on admission Baseline Cr widely variable from 3-4.5, most recently mid 3s at last hospital discharge.    -Continue bicarb -Consult nephrology, appreciate recommendations    Acute stroke, likely embolic, multifocal, bihemispheric Admitted with new left hemiparesis.  MRI showed numerous subcentimeter acute infarcts in bilateral hemispheres, multiple vascular  territories.  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    Then in May 2021 was admitted to outside hospital with AKI on CKD, CT showed bilateral hydronephrosis and ?perineal mass.  On further workup, this mass appeared to be a diverticulum from the bladder.  Urology were consulted, he was taken for cystoscopy, and on cystoscopy, a large 300cc posterior bladder diverticulum was found, with purulent contents, culture growing multiple species.  Urology recommended outpatient follow up for possible diverticulectomy.  He was treated with ceftriaxone for a few days and discharged on oral cefdinir.  See Op note from outside hospital in "Procedures" section below  Here, he is complaining of his baseline urinary hesitancy and dysuria, and his urine again is growing bacteria.  Urine growing multiple species. -Continue ceftriaxone  -Continue Flomax  I do not feel this is his main clinical process, and I feel this diverticulectomy can be followed as an outpatient -Follow up outpatient with [facility] for diverticulectomy     Hypoalbuminemia Acute, not chronic  PTSD Chronic pain syndrome -Continue duloxetine, gabapentin, tramadol  Hx Hodgkin Lymphoma Dx'd 2010.  S/p ABVD and radX completed 2011.  No recurrence.  Anemia of CKD and chronic B12 deficiency Hx gastric bypass Hgb trending down to 7.3 today and patient symptomatic.  No clinical beleding, DAT negative, LDH and hapto negative, doubt  hemolysis. -Trend  Hgb -Transfuse 1 unit for symptomatic anemia -Resume B12 supplements at D/c  Stage I sacral pressure injury, POA  Severe protein calorie malnutrition As evidenced by severe loss of subcutaneous muscle mass and fat, chronic illness (Hodgkin's lymphoma), poor p.o. intake, and pressure injury.          Disposition: Status is: Inpatient  Remains inpatient appropriate because:Ongoing diagnostic testing needed not appropriate for outpatient work up   Dispo:  Patient From: Home  Planned Disposition: [place]  Expected discharge date: 10/22/19  Medically stable for discharge: No                 MDM: The below labs and imaging reports were reviewed and summarized above.  Medication management as above.  The patietn has an acute illness (possible TTP) pose threat to life, limb, bodily fcn, abrupt neuro change  DVT prophylaxis:  SCDs  Code Status: FULL Family Communication: mother at bedside    Consultants:   Neurology  Nephrology  Hematology  Procedures:     5/27 cystogram at [facility] Operative Note  Attending Surgeon: Perfecto Kingdom, MD  Resident/Assistant Surgeon:  None  Preoperative Diagnosis: AKI/HYDRONEPHROSIS/BPH WITH URINARY RETENTION, PELVIC MASS  Postoperative Diagnosis: SAA, BLADDER DIVERTICULUM  Procedure Performed: Procedure(s): CYSTOSCOPY, BILATERAL RGP, INTRA-OP CYSTOGRAM  Anesthesia: Choice  Intraprocedure I/O Totals   Intake  Propofol Drip 0.00 mL  The total shown is the total volume documented since Anesthesia Start was filed.  Lactated Ringers 800.00 mL  Total Intake 800 mL    Specimen(s) sent:  Order Name Source Comment Collection Info Order Time  URINE CULTURE Catheterized Intraoperative urine culture 08/26/2019 12:12 PM  Release to patient Manual release only   Drains: 20 [place] coud catheter  Implants: None  EBL: N/A  Complications: None  Disposition: PACU - hemodynamically  stable.  Findings:  1. The anterior urethra was normal. 2. The posterior urethra revealed moderate trilobar hypertrophy. The median lobe was not severe. 3. Bilateral retrograde normal. 4. Significant bladder trabeculation. No tumor or stone. Capacious. 5. Pinpoint posterior midline diverticular os noted with milky discharge; cystogram and diverticulogram performed.  6. Diverticulum culture obtained.  Brief History: See preop notes.  Surgical Technique: After informed consent was obtained, the patient was taken the cystoscopy suite and placed upon the table. He was given heavy sedation anesthesia. He is on scheduled antibiotics. We performed a surgical timeout. We have reviewed the informed consent.   Patient was placed in dorsolithotomy position sterilely prepped and draped per routine.  Rigid cystoscopy was performed. Bilateral retrograde pyelograms were performed with a 5 [place] open-ended catheter. They were benign.  I performed a cystogram per routine with 300 cc of contrast; images sent to PACS.  A pinpoint posteriorly located midline diverticular os was appreciated beyond the intratrigonal ridge. The os was intubated with a 5 [place] open-ended catheter and diverticulogram was performed. There was a large, posterior, midline, heavily trabeculated bladder diverticulum noted. Culture was obtained.  All rigid instruments were withdrawn. Bladder was drained with a 20 [place] coud catheter. Effluent was clear.  Perfecto Kingdom, MD       Antimicrobials:   Ceftriaxone 7/18 >>  Doxycycline 7/18 >>   Culture data:   7/18 Blood culture x2 -- no gorwth to date  7/18 Urine culture -- no growth          Subjective: No new rashes other than the petechiae on his legs which were there for the last several days.  He is very weak.  He had a period of right-sided vision loss a few minutes ago which was transitory.  No confusion, fever.  No vomiting.  He is still weak in  the left side.  His butt hurts from his pressure wound.      Objective: Vitals:   10/18/19 1400 10/18/19 1639 10/18/19 1959 10/19/19 0733  BP:  113/77 121/75 124/78  Pulse: 62 68 69 63  Resp: 12 16 13 11   Temp:  97.9 F (36.6 C)  97.6 F (36.4 C)  TempSrc:  Oral  Oral  SpO2: 100% 100% 98% 100%  Weight:      Height:        Intake/Output Summary (Last 24 hours) at 10/19/2019 1050 Last data filed at 10/19/2019 0735 Gross per 24 hour  Intake 240 ml  Output 1100 ml  Net -860 ml   Filed Weights   10/17/19 1216  Weight: 55.3 kg    Examination: General appearance: Thin adult male, lying in bed, no acute distress, appears very tired and listless     HEENT: Anicteric, conjunctival pale, lids and lashes normal.  No nasal deformity, discharge, or epistaxis, Skin: Warm and dry, no jaundice.  Pale.  Petechiae on bilateral legs, unchanged, no new lesions Cardiac: RRR no murmurs.  JVP normal, trace bilateral lower extremity edema Respiratory: Normal respiratory rate and rhythm, lungs clear without rales or wheezes Abdomen: Abdomen soft, no tenderness palpation or guarding, no ascites or distention, I do not appreciate hepatosplenomegaly. MSK: Diffuse severe loss of subcutaneous muscle mass and fat. Neuro: Awake and alert, extraocular movements intact.  Left-sided hemiparesis.  Speech fluent. Psych: Sensorium intact and responding to questions, attention seems diminished, affect flat, judgment insight appear normal    Data Reviewed: I have personally reviewed following labs and imaging studies:  CBC: Recent Labs  Lab 10/17/19 1241 10/17/19 1311 10/18/19 0013 10/19/19 0336  WBC 18.8*  --  11.7* 7.8  NEUTROABS 17.7*  --  10.9*  --   HGB 8.6*  9.2*  --  7.8* 7.3*  HCT 26.5*  27.0*  --  25.2* 23.1*  MCV 89.5  --  89.0 90.6  PLT 69* 68* 61* 66*   Basic Metabolic Panel: Recent Labs  Lab 10/17/19 1241 10/18/19 0013 10/19/19 0336  NA 133*  137 135 136  K 3.1*  3.1* 3.5  3.5  CL 111  108 115* 117*  CO2 13* 14* 14*  GLUCOSE 82  79 88 95  BUN 42*  38* 41* 37*  CREATININE 3.74*  4.10* 3.52* 3.41*  CALCIUM 7.4* 7.4* 7.3*  PHOS  --  5.1*  --    GFR: Estimated Creatinine Clearance: 19.1 mL/min (A) (by C-G formula based on SCr of 3.41 mg/dL (H)). Liver Function Tests: Recent Labs  Lab 10/17/19 1241 10/18/19 0013 10/19/19 0336  AST 18  --  24  ALT 18  --  19  ALKPHOS 96  --  86  BILITOT 0.4  --  0.7  PROT 5.6*  --  4.6*  ALBUMIN 2.0* 1.6* 1.6*   No results for input(s): LIPASE, AMYLASE in the last 168 hours. No results for input(s): AMMONIA in the last 168 hours. Coagulation Profile: Recent Labs  Lab 10/17/19 1241 10/17/19 1311  INR 1.5* 1.6*   Cardiac Enzymes: No results for input(s): CKTOTAL, CKMB, CKMBINDEX, TROPONINI in the last 168 hours. BNP (last 3 results) No results for input(s): PROBNP in the last 8760 hours. HbA1C: Recent Labs  10/18/19 0013  HGBA1C 5.4   CBG: Recent Labs  Lab 10/17/19 1212  GLUCAP 64*   Lipid Profile: Recent Labs    10/18/19 0013  CHOL 60  HDL 21*  LDLCALC 26  TRIG 67  CHOLHDL 2.9   Thyroid Function Tests: No results for input(s): TSH, T4TOTAL, FREET4, T3FREE, THYROIDAB in the last 72 hours. Anemia Panel: Recent Labs    10/18/19 1913  RETICCTPCT 1.3   Urine analysis:    Component Value Date/Time   COLORURINE YELLOW 10/17/2019 1238   APPEARANCEUR CLOUDY (A) 10/17/2019 1238   LABSPEC 1.008 10/17/2019 1238   PHURINE 7.0 10/17/2019 1238   GLUCOSEU NEGATIVE 10/17/2019 1238   HGBUR LARGE (A) 10/17/2019 1238   BILIRUBINUR NEGATIVE 10/17/2019 1238   KETONESUR NEGATIVE 10/17/2019 1238   PROTEINUR 30 (A) 10/17/2019 1238   NITRITE NEGATIVE 10/17/2019 1238   LEUKOCYTESUR LARGE (A) 10/17/2019 1238   Sepsis Labs: @LABRCNTIP (procalcitonin:4,lacticacidven:4)  ) Recent Results (from the past 240 hour(s))  SARS Coronavirus 2 by RT PCR (hospital order, performed in [place] hospital  lab) Nasopharyngeal Nasopharyngeal Swab     Status: None   Collection Time: 10/17/19  1:14 PM   Specimen: Nasopharyngeal Swab  Result Value Ref Range Status   SARS Coronavirus 2 NEGATIVE NEGATIVE Final    Comment: (NOTE) SARS-CoV-2 target nucleic acids are NOT DETECTED.  The SARS-CoV-2 RNA is generally detectable in upper and lower respiratory specimens during the acute phase of infection. The lowest concentration of SARS-CoV-2 viral copies this assay can detect is 250 copies / mL. A negative result does not preclude SARS-CoV-2 infection and should not be used as the sole basis for treatment or other patient management decisions.  A negative result may occur with improper specimen collection / handling, submission of specimen other than nasopharyngeal swab, presence of viral mutation(s) within the areas targeted by this assay, and inadequate number of viral copies (<250 copies / mL). A negative result must be combined with clinical observations, patient history, and epidemiological information.  Fact Sheet for Patients:   StrictlyIdeas.no  Fact Sheet for Healthcare Providers: BankingDealers.co.za  This test is not yet approved or  cleared by the [place] FDA and has been authorized for detection and/or diagnosis of SARS-CoV-2 by FDA under an Emergency Use Authorization (EUA).  This EUA will remain in effect (meaning this test can be used) for the duration of the COVID-19 declaration under Section 564(b)(1) of the Act, 21 U.S.C. section 360bbb-3(b)(1), unless the authorization is terminated or revoked sooner.  Performed at [facility], [address] [place], [place] [address]   Blood culture (routine x 2)     Status: None (Preliminary result)   Collection Time: 10/17/19  2:26 PM   Specimen: Left Antecubital; Blood  Result Value Ref Range Status   Specimen Description   Final    LEFT ANTECUBITAL BOTTLES DRAWN AEROBIC AND  ANAEROBIC   Special Requests Blood Culture adequate volume  Final   Culture   Final    NO GROWTH 2 DAYS Performed at [facility], [address], [place], [place] [address]    Report Status PENDING  Incomplete  Urine culture     Status: Abnormal   Collection Time: 10/17/19  2:37 PM   Specimen: Urine, Catheterized  Result Value Ref Range Status   Specimen Description   Final    URINE, CATHETERIZED Performed at [facility], [address] [place], [place] [address]    Special Requests   Final  NONE Performed at [facility], [address] [place], [place] [address]    Culture MULTIPLE SPECIES PRESENT, SUGGEST RECOLLECTION (A)  Final   Report Status 10/19/2019 FINAL  Final  Blood culture (routine x 2)     Status: None (Preliminary result)   Collection Time: 10/17/19  2:41 PM   Specimen: Right Antecubital; Blood  Result Value Ref Range Status   Specimen Description   Final    RIGHT ANTECUBITAL BOTTLES DRAWN AEROBIC AND ANAEROBIC   Special Requests Blood Culture adequate volume  Final   Culture   Final    NO GROWTH 2 DAYS Performed at [facility], [address], [place], [place] [address]    Report Status PENDING  Incomplete         Radiology Studies: CT Angio Head W or Wo Contrast  Result Date: 10/17/2019 CLINICAL DATA:  [name] and slurred speech EXAM: CT ANGIOGRAPHY HEAD AND NECK TECHNIQUE: Multidetector CT imaging of the head and neck was performed using the standard protocol during bolus administration of intravenous contrast. Multiplanar CT image reconstructions and MIPs were obtained to evaluate the vascular anatomy. Carotid stenosis measurements (when applicable) are obtained utilizing NASCET criteria, using the distal internal carotid diameter as the denominator. CONTRAST:  44mL OMNIPAQUE IOHEXOL 350 MG/ML SOLN COMPARISON:  None. FINDINGS: CTA NECK Aortic arch: Great vessel origins are incompletely included. Visualized arch is unremarkable. Right carotid  system: Patent. There is no measurable stenosis at the ICA origin. Left carotid system: Common carotid origin is not included. Patent. Trace calcified plaque at the ICA origin without measurable stenosis. Vertebral arteries: Patent. Left vertebral artery is slightly dominant. Skeleton: Evidence of prior anterior fusion at C3-C4 with plate and screw fixation and well incorporated interbody graft. Degenerative changes of the cervical spine. Other neck: No mass or adenopathy. Upper chest: Included upper lungs are clear. Review of the MIP images confirms the above findings CTA HEAD Anterior circulation: Intracranial internal carotid arteries are patent. Anterior cerebral arteries are patent. 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 Middle cerebral arteries are patent. Posterior circulation: Intracranial vertebral arteries, basilar artery, and posterior cerebral arteries are patent. Venous sinuses: Patent as allowed by contrast bolus timing. Review of the MIP images confirms the above findings IMPRESSION: No large vessel occlusion or hemodynamically significant stenosis. Electronically Signed   By: Macy Mis M.D.   On: 10/17/2019 13:15   CT HEAD WO CONTRAST  Result Date: 10/18/2019 CLINICAL DATA:  Follow-up stroke. [name] and numbness. Scattered small infarctions by MRI. EXAM: CT HEAD WITHOUT CONTRAST TECHNIQUE: Contiguous axial images were obtained from the base of the skull through the vertex without intravenous contrast. COMPARISON:  10/17/2019 FINDINGS: Brain: No acute finding affecting the brainstem or cerebellum by CT. Cerebral hemispheres show generalized atrophy, somewhat premature for age. Subtle evidence cortical infarction in the right occipital lobe. No mass effect or hemorrhage. Other small infarctions seen by MRI not visible by CT. No hemorrhage, hydrocephalus or extra-axial collection. Vascular: There is atherosclerotic calcification of the major vessels at the base of the brain. Skull:  Negative Sinuses/Orbits: Clear/normal Other: None IMPRESSION: Subtle evidence of cortical infarction in the right occipital region. No mass effect or hemorrhage. None of the other acute infarctions visible by MRI are visible on CT. Generalized volume loss, somewhat premature for age. Electronically Signed   By: Nelson Chimes M.D.   On: 10/18/2019 13:24   MR BRAIN WO CONTRAST  Result Date: 10/18/2019 CLINICAL DATA:  Stroke follow-up.  [name] and  numbness. EXAM: MRI HEAD WITHOUT CONTRAST TECHNIQUE: Multiplanar, multiecho pulse sequences of the brain and surrounding structures were obtained without intravenous contrast. COMPARISON:  Head CT and CTA 10/17/2019 FINDINGS: Brain: There are subcentimeter acute infarcts in the cerebellum bilaterally. There is a small acute cortical infarct in the posteromedial right occipital lobe. There are multiple subcentimeter acute infarcts in the right corpus callosum, right cingulate gyrus, and right greater than left frontal lobes. No intracranial hemorrhage, mass, midline shift, or extra-axial fluid collection is identified. The ventricles and sulci are normal. Vascular: Major intracranial vascular flow voids are preserved. Skull and upper cervical spine: No suspicious marrow lesion. Anterior fusion at C3-4. Sinuses/Orbits: Unremarkable orbits. Clear paranasal sinuses. Small right mastoid effusion. Other: None. 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 Electronically Signed   By: Logan Bores M.D.   On: 10/18/2019 07:34   DG Chest Portable 1 View  Result Date: 10/17/2019 CLINICAL DATA:  Weakness, Reports went to bed last night today awakened and fell to the left sitting up - then fell out of bed In ED makes sffort to raise L arm, but cannot Cannot raise either R or L leg EXAM: PORTABLE CHEST 1 VIEW COMPARISON:  None. FINDINGS: The heart size and mediastinal contours are within normal limits. Low lung  volumes. The lungs are clear. No pneumothorax or significant pleural effusion. No acute finding in the visualized skeleton. Prominent distended loops of bowel in the abdomen. IMPRESSION: No acute cardiopulmonary finding. Electronically Signed   By: Audie Pinto M.D.   On: 10/17/2019 13:59   CT HEAD CODE STROKE WO CONTRAST  Result Date: 10/17/2019 CLINICAL DATA:  Code stroke. Subacute deficit with left-sided weakness. EXAM: CT HEAD WITHOUT CONTRAST TECHNIQUE: Contiguous axial images were obtained from the base of the skull through the vertex without intravenous contrast. COMPARISON:  None. FINDINGS: Brain: No evidence of acute infarction, hemorrhage, hydrocephalus, extra-axial collection or mass lesion/mass effect. Vascular: Negative for hyperdense vessel Skull: Negative Sinuses/Orbits: Mild mucosal edema left maxillary sinus otherwise clear sinuses. Negative orbit. Other: None ASPECTS ([place] Stroke Program Early CT Score) - Ganglionic level infarction (caudate, lentiform nuclei, internal capsule, insula, M1-M3 cortex): 7 - Supraganglionic infarction (M4-M6 cortex): 3 Total score (0-10 with 10 being normal): 10 IMPRESSION: 1. No acute abnormality 2. ASPECTS is 10 3. These results were called by telephone at the time of interpretation on 10/17/2019 at 12:56 pm to provider MATTHEW TRIFAN , who verbally acknowledged these results. Electronically Signed   By: Franchot Gallo M.D.   On: 10/17/2019 12:54   CT ANGIO NECK CODE STROKE  Result Date: 10/17/2019 CLINICAL DATA:  [name] and slurred speech EXAM: CT ANGIOGRAPHY HEAD AND NECK TECHNIQUE: Multidetector CT imaging of the head and neck was performed using the standard protocol during bolus administration of intravenous contrast. Multiplanar CT image reconstructions and MIPs were obtained to evaluate the vascular anatomy. Carotid stenosis measurements (when applicable) are obtained utilizing NASCET criteria, using the distal internal carotid  diameter as the denominator. CONTRAST:  63mL OMNIPAQUE IOHEXOL 350 MG/ML SOLN COMPARISON:  None. FINDINGS: CTA NECK Aortic arch: Great vessel origins are incompletely included. Visualized arch is unremarkable. Right carotid system: Patent. There is no measurable stenosis at the ICA origin. Left carotid system: Common carotid origin is not included. Patent. Trace calcified plaque at the ICA origin without measurable stenosis. Vertebral arteries: Patent. Left vertebral artery is slightly dominant. Skeleton: Evidence of prior anterior fusion at C3-C4 with plate and screw fixation  and well incorporated interbody graft. Degenerative changes of the cervical spine. Other neck: No mass or adenopathy. Upper chest: Included upper lungs are clear. Review of the MIP images confirms the above findings CTA HEAD Anterior circulation: Intracranial internal carotid arteries are patent. Anterior cerebral arteries are patent. 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 Middle cerebral arteries are patent. Posterior circulation: Intracranial vertebral arteries, basilar artery, and posterior cerebral arteries are patent. Venous sinuses: Patent as allowed by contrast bolus timing. Review of the MIP images confirms the above findings IMPRESSION: No large vessel occlusion or hemodynamically significant stenosis. Electronically Signed   By: Macy Mis M.D.   On: 10/17/2019 13:15        Scheduled Meds: . sodium chloride   Intravenous Once  . aspirin EC  81 mg Oral Daily  . Chlorhexidine Gluconate Cloth  6 each Topical Daily  . DULoxetine  30 mg Oral QHS  . DULoxetine  60 mg Oral Daily  . gabapentin  300 mg Oral BID  . midodrine  2.5 mg Oral TID  . pantoprazole  40 mg Oral BID AC  . pneumococcal 23 valent vaccine  0.5 mL Intramuscular Tomorrow-1000  . sodium bicarbonate  325 mg Oral TID  . tamsulosin  0.4 mg Oral Daily   Continuous Infusions: . cefTRIAXone (ROCEPHIN)  IV 2 g (10/18/19 1331)  . doxycycline (VIBRAMYCIN) IV  100 mg (10/19/19 0058)     LOS: 2 days    Time spent: 35 minutes    Edwin Dada, MD [facility] 10/19/2019, 10:50 AM     Please page though [place] or Epic secure chat:  For [employer], Adult nurse

## 2019-10-19 NOTE — Progress Notes (Signed)
°  Speech Language Pathology Treatment: Cognitive-Linquistic  Patient Details Name: Chase [name] MRN: [id number] DOB: [birth year] Today's Date: 10/19/2019 Time: 2355-7322 SLP Time Calculation (min) (ACUTE ONLY): 13 min  Assessment / Plan / Recommendation Clinical Impression  Pt was seen for cognitive-linguistic treatment. He was alert and cooperative throughout the session, but it was abbreviated per his request due to his "brain getting tired". He was educated regarding the results of the evaluation and the potential impact of his deficits on his independence. He verbalized understanding. He demonstrated 80% accuracy with a mental manipulation sequencing task increasing to 100% accuracy with cues. He achieved 60% accuracy with time management problems increasing to 80% with verbal prompts. He required cues for focused attention throughout the session and repetitions were frequently needed due to impairments in memory. SLP will continue to follow pt.    HPI HPI:  54 y.o. male presented to [facility] 10/17/19 with c/o left sided weakness and sensory loss. MRI 7/19: subcentimeter acute infarcts in the cerebellum bilaterally. There is a small acute cortical infarct in the posteromedial right occipital lobe. There are multiple subcentimeter acute infarcts in the right corpus callosum, right cingulate gyrus, and right greater than left frontal lobes. PMHx chemical exposure, PTSD, renal disorder. GERD, chronic pain syndrome.      SLP Plan  Continue with current plan of care  Patient needs continued Speech Lanaguage Pathology Services    Recommendations                   Follow up Recommendations: Inpatient Rehab SLP Visit Diagnosis: Cognitive communication deficit (G25.427) Plan: Continue with current plan of care       Aubry Rankin I. Hardin Negus, [place], [place] Office number [phone] Pager [place] 10/19/2019, 5:40  PM

## 2019-10-19 NOTE — Progress Notes (Signed)
Patient A&O, tolerating blood transfusion well, sitting upright eating lunch

## 2019-10-19 NOTE — Progress Notes (Signed)
Inpatient Rehabilitation Admissions Coordinator  I spoke with patient's son, Corrie Brannen by phone. He lives in [place], and his brother in the [place] area. Pt lived alone and drove pta. Family to discuss caregiver supports. Patient has his medical care through [place] coverage which [facility] is not contracted. Son would like to pursue rehab venues of inpt rehab vs SNF through [place] contracted facilities. I have alerted Kathlee Nations, [place]. We swill sign off at this time.  Danne Baxter, RN, MSN Rehab Admissions Coordinator [phone] 10/19/2019 3:07 PM

## 2019-10-19 NOTE — Evaluation (Signed)
Speech Language Pathology Evaluation Patient Details Name: Chase [name] MRN: [id number] DOB: [birth year] Today's Date: 10/19/2019 Time: 0254-2706 SLP Time Calculation (min) (ACUTE ONLY): 14 min  Problem List:  Patient Active Problem List   Diagnosis Date Noted   Cerebral embolism with cerebral infarction 10/18/2019   Acute left-sided weakness 10/17/2019   CKD (chronic kidney disease), stage IV ([place]) 10/17/2019   Chronic pain 10/17/2019   Fall 10/17/2019   Acute lower UTI 10/17/2019   Past Medical History:  Past Medical History:  Diagnosis Date   Cervical radiculopathy    with left sided weakness   Chemical exposure    service related injury   Chronic pain    GIB (gastrointestinal bleeding)    PTSD (post-traumatic stress disorder)    Renal disorder    CKD   Urinary retention    due to bladder outlet obstruction   Past Surgical History:  Past Surgical History:  Procedure Laterality Date   GASTRIC BYPASS     HERNIA REPAIR     HPI:   54 y.o. male presented to [facility] ED 10/17/19 with c/o left sided weakness and sensory loss. MRI 7/19: subcentimeter acute infarcts in the cerebellum bilaterally. There is a small acute cortical infarct in the posteromedial right occipital lobe. There are multiple subcentimeter acute infarcts in the right corpus callosum, right cingulate gyrus, and right greater than left frontal lobes. PMHx chemical exposure, PTSD, renal disorder. GERD, chronic pain syndrome.   Assessment / Plan / Recommendation Clinical Impression  Pt participated in speech/language/cognition evaluation. He reported that he believes he is "different" from his baseline but was unable to specify the differences he has noticed other than difficulty walking. Pt denied any baseline deficits in speech, language, or cognition and indicated that he does not currently work. [facility] Mental Status Examination was completed to evaluate the pt's  cognitive-linguistic skills. He achieved a score of 14/30 which is below the normal limits of 27 or more out of 30. He exhibited difficulty in the areas of awareness, attention, memory, reasoning, mental manipulation, complex problem solving, and executive function. Skilled SLP services are clinically indicated at this time to improve cognitive-linguistic function.    SLP Assessment  SLP Recommendation/Assessment: Patient needs continued Speech Lanaguage Pathology Services SLP Visit Diagnosis: Cognitive communication deficit (R41.841)    Follow Up Recommendations  Inpatient Rehab    Frequency and Duration min 2x/week  2 weeks      SLP Evaluation Cognition  Overall Cognitive Status: Impaired/Different from baseline Arousal/Alertness: Awake/alert Orientation Level: Oriented to situation;Oriented to person Attention: Focused;Sustained Focused Attention: Impaired Focused Attention Impairment: Verbal complex Sustained Attention: Impaired Sustained Attention Impairment: Verbal complex Memory: Impaired Memory Impairment: Retrieval deficit;Decreased recall of new information (Immediate: 5/5 with repetition; delayed: 1/5; with cues: 2/3) Awareness: Impaired Awareness Impairment: Emergent impairment Problem Solving: Impaired Problem Solving Impairment: Verbal complex Executive Function: Reasoning;Sequencing;Organizing Sequencing: Impaired Sequencing Impairment: Verbal complex (Clock drawing: 2/4) Organizing: Impaired Organizing Impairment: Verbal complex (Backward digit span: 1/3)       Comprehension  Auditory Comprehension Overall Auditory Comprehension: Appears within functional limits for tasks assessed Yes/No Questions: Within Functional Limits Commands: Within Functional Limits Conversation: Complex Visual Recognition/Discrimination Discrimination: Within Function Limits Reading Comprehension Reading Status: Within funtional limits    Expression Expression Primary Mode of  Expression: Verbal Verbal Expression Overall Verbal Expression: Appears within functional limits for tasks assessed Initiation: No impairment Level of Generative/Spontaneous Verbalization: Conversation Repetition: No impairment Naming: No impairment Pragmatics: Impairment Impairments: Abnormal affect;Eye  contact;Monotone   Oral / Motor  Oral Motor/Sensory Function Overall Oral Motor/Sensory Function: Within functional limits Motor Speech Overall Motor Speech: Appears within functional limits for tasks assessed Respiration: Within functional limits Phonation: Normal Resonance: Within functional limits Articulation: Within functional limitis Intelligibility: Intelligible Motor Planning: Witnin functional limits Motor Speech Errors: Not applicable   Bharath Bernstein I. Hardin Negus, [place], [place] Office number [phone] Pager [place] 10/19/2019, 5:34 PM

## 2019-10-19 NOTE — Progress Notes (Signed)
Transfusion started at 1155a

## 2019-10-19 NOTE — Progress Notes (Signed)
Chase [name] Progress Note    Assessment/ Plan:   54 year old male past medical history of BPH, multiple episodes of UTI and obstruction, Hodgkin's lymphoma, CKD stage IV, coronary artery disease, gastric bypass 1996, cervical radiculopathy, PTSD, chronic pain syndrome who presented with weakness found to have stroke.  Consulted for thrombocytopenia with concerns for TTP. 1. CKD4 underlying etiology likely related to multiple episodes of AKI secondary to BPH/obstruction -At present his renal function is currently at baseline and stable.  Seems that his baseline creatinine is largely fluctuant between 3 and 4.5 -Avoid nephrotoxic medications including NSAIDs and iodinated intravenous contrast exposure unless the latter is absolutely indicated.  Preferred narcotic agents for pain control are hydromorphone, fentanyl, and methadone. Morphine should not be used. Avoid Baclofen and avoid oral sodium phosphate and magnesium citrate based laxatives / bowel preps. Continue strict Input and Output monitoring. Will monitor the patient closely with you and intervene or adjust therapy as indicated by changes in clinical status/labs  2.  Thrombocytopenia, less likely to be TTP more possibly related to underlying infection/tickborne illness. -Renal function stable and at baseline.  Platelet count today stable.  Peripheral smear was reviewed by hematology yesterday which revealed no significant schistocytes.  LDH and haptoglobin were also normal.  ADAMTS13 pending. Shiga toxin pending.  No indication for plasmapheresis at this junction which I discussed with hematology yesterday. 3.  Metabolic acidosis secondary to chronic kidney disease along with history of gastric bypass.  Increased sodium bicarbonate tablets to 1300 mg 3 times daily.  Can uptitrate this further to 1950mg  4. H/o recurrent urinary obstruction.  Maintain Foley (placed at [facility] back in May). 5.  Bladder diverticulum.  Can  follow-up with urology as an outpatient 6. Anemia of chronic kidney disease and b12 deficiency. Check iron panel. Agree with 1u prbc today  Subjective:   Has not had diarrhea here. Denies any new issues, denies fevers, chest pain, SOB. Feels weak   Objective:   BP 117/78   Pulse 65   Temp 98.4 F (36.9 C) (Oral)   Resp 12   Ht 5\' 8"  (1.727 m)   Wt 55.3 kg   SpO2 100%   BMI 18.55 kg/m   Intake/Output Summary (Last 24 hours) at 10/19/2019 1253 Last data filed at 10/19/2019 1153 Gross per 24 hour  Intake 555 ml  Output 1100 ml  Net -545 ml   Weight change:   Physical Exam: Gen:NAD, chronically ill appearing HEENT: temporal wasting  CVS:s1s2, rrr Resp:cta bl, no w/r/r/c Abd: soft, nt/nd Skin: petechiae bl le's Ext:no edema Neuro: Speech clear and coherent, left-sided weakness  Imaging: CT Angio Head W or Wo Contrast  Result Date: 10/17/2019 CLINICAL DATA:  [name] and slurred speech EXAM: CT ANGIOGRAPHY HEAD AND NECK TECHNIQUE: Multidetector CT imaging of the head and neck was performed using the standard protocol during bolus administration of intravenous contrast. Multiplanar CT image reconstructions and MIPs were obtained to evaluate the vascular anatomy. Carotid stenosis measurements (when applicable) are obtained utilizing NASCET criteria, using the distal internal carotid diameter as the denominator. CONTRAST:  54mL OMNIPAQUE IOHEXOL 350 MG/ML SOLN COMPARISON:  None. FINDINGS: CTA NECK Aortic arch: Great vessel origins are incompletely included. Visualized arch is unremarkable. Right carotid system: Patent. There is no measurable stenosis at the ICA origin. Left carotid system: Common carotid origin is not included. Patent. Trace calcified plaque at the ICA origin without measurable stenosis. Vertebral arteries: Patent. Left vertebral artery is slightly dominant. Skeleton: Evidence of prior  anterior fusion at C3-C4 with plate and screw fixation and well  incorporated interbody graft. Degenerative changes of the cervical spine. Other neck: No mass or adenopathy. Upper chest: Included upper lungs are clear. Review of the MIP images confirms the above findings CTA HEAD Anterior circulation: Intracranial internal carotid arteries are patent. Anterior cerebral arteries are patent. 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 Middle cerebral arteries are patent. Posterior circulation: Intracranial vertebral arteries, basilar artery, and posterior cerebral arteries are patent. Venous sinuses: Patent as allowed by contrast bolus timing. Review of the MIP images confirms the above findings IMPRESSION: No large vessel occlusion or hemodynamically significant stenosis. Electronically Signed   By: Macy Mis M.D.   On: 10/17/2019 13:15   CT HEAD WO CONTRAST  Result Date: 10/18/2019 CLINICAL DATA:  Follow-up stroke. [name] and numbness. Scattered small infarctions by MRI. EXAM: CT HEAD WITHOUT CONTRAST TECHNIQUE: Contiguous axial images were obtained from the base of the skull through the vertex without intravenous contrast. COMPARISON:  10/17/2019 FINDINGS: Brain: No acute finding affecting the brainstem or cerebellum by CT. Cerebral hemispheres show generalized atrophy, somewhat premature for age. Subtle evidence cortical infarction in the right occipital lobe. No mass effect or hemorrhage. Other small infarctions seen by MRI not visible by CT. No hemorrhage, hydrocephalus or extra-axial collection. Vascular: There is atherosclerotic calcification of the major vessels at the base of the brain. Skull: Negative Sinuses/Orbits: Clear/normal Other: None IMPRESSION: Subtle evidence of cortical infarction in the right occipital region. No mass effect or hemorrhage. None of the other acute infarctions visible by MRI are visible on CT. Generalized volume loss, somewhat premature for age. Electronically Signed   By: Nelson Chimes M.D.   On: 10/18/2019 13:24   MR BRAIN WO  CONTRAST  Result Date: 10/18/2019 CLINICAL DATA:  Stroke follow-up.  [name] and numbness. EXAM: MRI HEAD WITHOUT CONTRAST TECHNIQUE: Multiplanar, multiecho pulse sequences of the brain and surrounding structures were obtained without intravenous contrast. COMPARISON:  Head CT and CTA 10/17/2019 FINDINGS: Brain: There are subcentimeter acute infarcts in the cerebellum bilaterally. There is a small acute cortical infarct in the posteromedial right occipital lobe. There are multiple subcentimeter acute infarcts in the right corpus callosum, right cingulate gyrus, and right greater than left frontal lobes. No intracranial hemorrhage, mass, midline shift, or extra-axial fluid collection is identified. The ventricles and sulci are normal. Vascular: Major intracranial vascular flow voids are preserved. Skull and upper cervical spine: No suspicious marrow lesion. Anterior fusion at C3-4. Sinuses/Orbits: Unremarkable orbits. Clear paranasal sinuses. Small right mastoid effusion. Other: None. 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 Electronically Signed   By: Logan Bores M.D.   On: 10/18/2019 07:34   DG Chest Portable 1 View  Result Date: 10/17/2019 CLINICAL DATA:  Weakness, Reports went to bed last night today awakened and fell to the left sitting up - then fell out of bed In ED makes sffort to raise L arm, but cannot Cannot raise either R or L leg EXAM: PORTABLE CHEST 1 VIEW COMPARISON:  None. FINDINGS: The heart size and mediastinal contours are within normal limits. Low lung volumes. The lungs are clear. No pneumothorax or significant pleural effusion. No acute finding in the visualized skeleton. Prominent distended loops of bowel in the abdomen. IMPRESSION: No acute cardiopulmonary finding. Electronically Signed   By: Audie Pinto M.D.   On: 10/17/2019 13:59   CT ANGIO NECK CODE STROKE  Result Date: 10/17/2019 CLINICAL DATA:  [name] and slurred speech EXAM: CT ANGIOGRAPHY HEAD AND NECK TECHNIQUE: Multidetector CT imaging of the head and neck was performed using the standard protocol during bolus administration of intravenous contrast. Multiplanar CT image reconstructions and MIPs were obtained to evaluate the vascular anatomy. Carotid stenosis measurements (when applicable) are obtained utilizing NASCET criteria, using the distal internal carotid diameter as the denominator. CONTRAST:  76mL OMNIPAQUE IOHEXOL 350 MG/ML SOLN COMPARISON:  None. FINDINGS: CTA NECK Aortic arch: Great vessel origins are incompletely included. Visualized arch is unremarkable. Right carotid system: Patent. There is no measurable stenosis at the ICA origin. Left carotid system: Common carotid origin is not included. Patent. Trace calcified plaque at the ICA origin without measurable stenosis. Vertebral arteries: Patent. Left vertebral artery is slightly dominant. Skeleton: Evidence of prior anterior fusion at C3-C4 with plate and screw fixation and well incorporated interbody graft. Degenerative changes of the cervical spine. Other neck: No mass or adenopathy. Upper chest: Included upper lungs are clear. Review of the MIP images confirms the above findings CTA HEAD Anterior circulation: Intracranial internal carotid arteries are patent. Anterior cerebral arteries are patent. 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 Middle cerebral arteries are patent. Posterior circulation: Intracranial vertebral arteries, basilar artery, and posterior cerebral arteries are patent. Venous sinuses: Patent as allowed by contrast bolus timing. Review of the MIP images confirms the above findings IMPRESSION: No large vessel occlusion or hemodynamically significant stenosis. Electronically Signed   By: Macy Mis M.D.   On: 10/17/2019 13:15    Labs: BMET Recent Labs  Lab 10/17/19 1241 10/18/19 0013 10/19/19 0336  NA 133*  137 135 136  K 3.1*  3.1* 3.5 3.5   CL 111  108 115* 117*  CO2 13* 14* 14*  GLUCOSE 82  79 88 95  BUN 42*  38* 41* 37*  CREATININE 3.74*  4.10* 3.52* 3.41*  CALCIUM 7.4* 7.4* 7.3*  PHOS  --  5.1*  --    CBC Recent Labs  Lab 10/17/19 1241 10/17/19 1311 10/18/19 0013 10/19/19 0336  WBC 18.8*  --  11.7* 7.8  NEUTROABS 17.7*  --  10.9*  --   HGB 8.6*  9.2*  --  7.8* 7.3*  HCT 26.5*  27.0*  --  25.2* 23.1*  MCV 89.5  --  89.0 90.6  PLT 69* 68* 61* 66*    Medications:    . aspirin EC  81 mg Oral Daily  . Chlorhexidine Gluconate Cloth  6 each Topical Daily  . DULoxetine  30 mg Oral QHS  . DULoxetine  60 mg Oral Daily  . gabapentin  300 mg Oral BID  . midodrine  2.5 mg Oral TID  . pantoprazole  40 mg Oral BID AC  . pneumococcal 23 valent vaccine  0.5 mL Intramuscular Tomorrow-1000  . sodium bicarbonate  325 mg Oral TID  . tamsulosin  0.4 mg Oral Daily      Gean Quint, MD [name] 10/19/2019, 12:53 PM

## 2019-10-19 NOTE — CV Procedure (Signed)
2D echo attempted, but patient OTF. Will try later.

## 2019-10-20 ENCOUNTER — Inpatient Hospital Stay (HOSPITAL_COMMUNITY): Payer: No Typology Code available for payment source

## 2019-10-20 DIAGNOSIS — G8921 Chronic pain due to trauma: Secondary | ICD-10-CM

## 2019-10-20 DIAGNOSIS — N390 Urinary tract infection, site not specified: Secondary | ICD-10-CM | POA: Diagnosis not present

## 2019-10-20 DIAGNOSIS — N184 Chronic kidney disease, stage 4 (severe): Secondary | ICD-10-CM | POA: Diagnosis not present

## 2019-10-20 DIAGNOSIS — W19XXXA Unspecified fall, initial encounter: Secondary | ICD-10-CM

## 2019-10-20 LAB — COMPREHENSIVE METABOLIC PANEL
ALT: 19 U/L (ref 0–44)
AST: 26 U/L (ref 15–41)
Albumin: 1.6 g/dL — ABNORMAL LOW (ref 3.5–5.0)
Alkaline Phosphatase: 94 U/L (ref 38–126)
Anion gap: 7 (ref 5–15)
BUN: 32 mg/dL — ABNORMAL HIGH (ref 6–20)
CO2: 17 mmol/L — ABNORMAL LOW (ref 22–32)
Calcium: 7.2 mg/dL — ABNORMAL LOW (ref 8.9–10.3)
Chloride: 113 mmol/L — ABNORMAL HIGH (ref 98–111)
Creatinine, Ser: 3.06 mg/dL — ABNORMAL HIGH (ref 0.61–1.24)
GFR calc Af Amer: 25 mL/min — ABNORMAL LOW (ref >60)
GFR calc non Af Amer: 22 mL/min — ABNORMAL LOW (ref >60)
Glucose, Bld: 92 mg/dL (ref 70–99)
Potassium: 3.8 mmol/L (ref 3.5–5.1)
Sodium: 137 mmol/L (ref 135–145)
Total Bilirubin: 0.5 mg/dL (ref 0.3–1.2)
Total Protein: 5 g/dL — ABNORMAL LOW (ref 6.5–8.1)

## 2019-10-20 LAB — CBC
HCT: 28 % — ABNORMAL LOW (ref 39.0–52.0)
Hemoglobin: 8.9 g/dL — ABNORMAL LOW (ref 13.0–17.0)
MCH: 28 pg (ref 26.0–34.0)
MCHC: 31.8 g/dL (ref 30.0–36.0)
MCV: 88.1 fL (ref 80.0–100.0)
Platelets: 61 10*3/uL — ABNORMAL LOW (ref 150–400)
RBC: 3.18 MIL/uL — ABNORMAL LOW (ref 4.22–5.81)
RDW: 17.9 % — ABNORMAL HIGH (ref 11.5–15.5)
WBC: 6.2 10*3/uL (ref 4.0–10.5)
nRBC: 0 % (ref 0.0–0.2)

## 2019-10-20 LAB — TYPE AND SCREEN
ABO/RH(D): O POS
Antibody Screen: NEGATIVE
Unit division: 0

## 2019-10-20 LAB — BPAM RBC
Blood Product Expiration Date: 202108202359
ISSUE DATE / TIME: 202107201147
Unit Type and Rh: 5100

## 2019-10-20 LAB — PATHOLOGIST SMEAR REVIEW

## 2019-10-20 MED ORDER — TECHNETIUM TO 99M ALBUMIN AGGREGATED
4.2000 | Freq: Once | INTRAVENOUS | Status: AC | PRN
  Administered 2019-10-20: 4.2 via INTRAVENOUS

## 2019-10-20 NOTE — Progress Notes (Signed)
Occupational Therapy Treatment Patient Details Name: Chase [name] MRN: [id number] DOB: 24-Feb-1966 Today's Date: 10/20/2019    History of present illness Pt is a 54 y.o. male with PMH of cervical post-laminectomy syndrome, arthalgia of multiple sites, hodgkin's disease, PTSD, CKD, chronic pain syndrome, and anemia presenting to ED with L sided weakness, slurred speech, and fall. 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   OT comments  Pt making steady progress towards OT goals this session.  Pt continues to present with pain, impaired balance, decreased activity tolerance, and generalized weakness impacting pts ability to complete BADLs. Pt able to stand to stedy with MIN A needing assist to position LUE on stedy and facilitate bringing hips into full extension. Pt required MIN A for sitting balance EOB and MOD A to return to supine. Pt continues to be a great candidate for CIR, will continue to follow acutely per POC.    Follow Up Recommendations  CIR    Equipment Recommendations  Other (comment) (TBD at next venue of care)    Recommendations for Other Services      Precautions / Restrictions Precautions Precautions: Fall Restrictions Weight Bearing Restrictions: No       Mobility Bed Mobility Overal bed mobility: Needs Assistance Bed Mobility: Sit to Supine       Sit to supine: Mod assist   General bed mobility comments: pt able to lower trunk to pillow coming down on pts R side but required MODA  to elevate BLEs back to bed.  Transfers Overall transfer level: Needs assistance Equipment used: Ambulation equipment used Transfers: Sit to/from Stand Sit to Stand: Min assist         General transfer comment: pt required assist to position LUE on stedy, pt requried MIN A to power into standing from built up surface of recliner. pt required assist at hips to shift hips forward to lower flaps of stedy    Balance Overall balance assessment:  Needs assistance Sitting-balance support: Bilateral upper extremity supported;Feet supported Sitting balance-Leahy Scale: Poor Sitting balance - Comments: at least MIIN A to maintan upright posture Postural control: Right lateral lean Standing balance support: Bilateral upper extremity supported;During functional activity Standing balance-Leahy Scale: Poor Standing balance comment: reliant on BUE support and external assist                           ADL either performed or assessed with clinical judgement   ADL Overall ADL's : Needs assistance/impaired                     Lower Body Dressing:  (to stand to stedy only)   Toilet Transfer: Minimal assistance (sit<>stand with stedy) Toilet Transfer Details (indicate cue type and reason): simulated with stedy from recliner>EOB         Functional mobility during ADLs: Minimal assistance (to stand to stedy) General ADL Comments: pt with increased pain in buttock, limited by lethargy once back in bed     Vision       Perception     Praxis      Cognition Arousal/Alertness: Lethargic;Awake/alert (initially awake but very lethargic once back in bed) Behavior During Therapy: Flat affect (kept eyes closed most of session) Overall Cognitive Status: Impaired/Different from baseline Area of Impairment: Problem solving;Awareness;Following commands;Attention                   Current Attention Level: Selective   Following  Commands: Follows one step commands consistently;Follows one step commands with increased time   Awareness: Emergent Problem Solving: Slow processing;Decreased initiation;Difficulty sequencing;Requires verbal cues;Requires tactile cues General Comments: very flat and slow to process. needing cues to open eyes and attend to session        Exercises     Shoulder Instructions       General Comments      Pertinent Vitals/ Pain       Pain Assessment: 0-10 Pain Score: 7  Pain  Location: buttock Pain Descriptors / Indicators: Discomfort;Grimacing;Guarding;Tender Pain Intervention(s): Monitored during session;Repositioned  Home Living                                          Prior Functioning/Environment              Frequency  Min 2X/week        Progress Toward Goals  OT Goals(current goals can now be found in the care plan section)  Progress towards OT goals: Progressing toward goals  Acute Rehab OT Goals Patient Stated Goal: Not have to depend on anyone  Time For Goal Achievement: 11/01/19 Potential to Achieve Goals: Good  Plan Discharge plan remains appropriate;Frequency remains appropriate    Co-evaluation                 AM-PAC OT "6 Clicks" Daily Activity     Outcome Measure   Help from another person eating meals?: A Little Help from another person taking care of personal grooming?: A Lot Help from another person toileting, which includes using toliet, bedpan, or urinal?: Total Help from another person bathing (including washing, rinsing, drying)?: Total Help from another person to put on and taking off regular upper body clothing?: A Lot Help from another person to put on and taking off regular lower body clothing?: Total 6 Click Score: 10    End of Session Equipment Utilized During Treatment: Gait belt;Other (comment) (stedy ')  OT Visit Diagnosis: Unsteadiness on feet (R26.81);Other abnormalities of gait and mobility (R26.89);Muscle weakness (generalized) (M62.81);Repeated falls (R29.6);History of falling (Z91.81);Low vision, both eyes (H54.2);Other symptoms and signs involving the nervous system (R29.898);Hemiplegia and hemiparesis Hemiplegia - Right/Left: Left Hemiplegia - dominant/non-dominant: Non-Dominant Hemiplegia - caused by: Cerebral infarction   Activity Tolerance Patient limited by lethargy;Patient limited by pain   Patient Left in bed;with call bell/phone within reach;with bed alarm set    Nurse Communication Mobility status        Time: 2707-8675 OT Time Calculation (min): 19 min  Charges: OT General Charges $OT Visit: 1 Visit OT Treatments $Therapeutic Activity: 8-22 mins  Chase Clam., COTA/L Acute Rehabilitation Services [phone]    Chase [name] 10/20/2019, 11:45 AM

## 2019-10-20 NOTE — Progress Notes (Signed)
[facility] Progress Note    Assessment/ Plan:   54 year old male past medical history of BPH, multiple episodes of UTI and obstruction, Hodgkin's lymphoma, CKD stage IV, coronary artery disease, gastric bypass 1996, cervical radiculopathy, PTSD, chronic pain syndrome who presented with weakness found to have stroke.  Consulted for thrombocytopenia with concerns for TTP. 1. CKD4 underlying etiology likely related to multiple episodes of AKI secondary to BPH/obstruction -At present his renal function is currently at baseline and stable (slightly better today).  Seems that his baseline creatinine is largely fluctuant between 3 and 4.5 -if he does need any contrast studies especially with this questionable PE found on echo then would recommend pre and post hydration with isotonic fluids (given his overall volume status on my exam today, he should be able to tolerate it) -Avoid nephrotoxic medications including NSAIDs and iodinated intravenous contrast exposure unless the latter is absolutely indicated.  Preferred narcotic agents for pain control are hydromorphone, fentanyl, and methadone. Morphine should not be used. Avoid Baclofen and avoid oral sodium phosphate and magnesium citrate based laxatives / bowel preps. Continue strict Input and Output monitoring. Will monitor the patient closely with you and intervene or adjust therapy as indicated by changes in clinical status/labs  2.  Thrombocytopenia, less likely to be TTP more possibly related to underlying infection/tickborne illness. -Renal function stable and at baseline (slightly better today).  Platelet count today stable.  Peripheral smear was reviewed by hematology on 7/19 which revealed no significant schistocytes.  LDH and haptoglobin were also normal.  ADAMTS13 33.9 (low but not <10). Shiga toxin pending.  No indication for plasmapheresis at this junction which I discussed with hematology at the day of consultation 3.  Metabolic  acidosis secondary to chronic kidney disease along with history of gastric bypass (stable/improved).  Increased sodium bicarbonate tablets to 1300 mg 3 times daily.  Can uptitrate this further to 1950mg  if needed 4. H/o recurrent urinary obstruction.  Maintain Foley (placed at [facility] back in May). 5.  Bladder diverticulum.  Can follow-up with urology as an outpatient 6. Anemia of chronic kidney disease and b12 deficiency. Check iron panel. Agree with 1u prbc today  Subjective:   No acute events overnight, no new complaints. Still feels weak. No diarrhea here.   Objective:   BP 135/75 (BP Location: Left Arm)   Pulse 71   Temp 97.8 F (36.6 C) (Oral)   Resp 11   Ht 5\' 8"  (1.727 m)   Wt 55.3 kg   SpO2 100%   BMI 18.55 kg/m   Intake/Output Summary (Last 24 hours) at 10/20/2019 0959 Last data filed at 10/20/2019 1610 Gross per 24 hour  Intake 1390 ml  Output 2050 ml  Net -660 ml   Weight change:   Physical Exam: Gen:NAD, chronically ill appearing HEENT: temporal wasting  CVS:s1s2, rrr Resp:cta bl, no w/r/r/c Abd: soft, nt/nd Skin: petechiae bl le's (unchanged) Ext:no edema Neuro: Speech clear and coherent, left-sided weakness  Imaging: CT HEAD WO CONTRAST  Result Date: 10/18/2019 CLINICAL DATA:  Follow-up stroke. [name] and numbness. Scattered small infarctions by MRI. EXAM: CT HEAD WITHOUT CONTRAST TECHNIQUE: Contiguous axial images were obtained from the base of the skull through the vertex without intravenous contrast. COMPARISON:  10/17/2019 FINDINGS: Brain: No acute finding affecting the brainstem or cerebellum by CT. Cerebral hemispheres show generalized atrophy, somewhat premature for age. Subtle evidence cortical infarction in the right occipital lobe. No mass effect or hemorrhage. Other small infarctions seen by MRI  not visible by CT. No hemorrhage, hydrocephalus or extra-axial collection. Vascular: There is atherosclerotic calcification of the major  vessels at the base of the brain. Skull: Negative Sinuses/Orbits: Clear/normal Other: None IMPRESSION: Subtle evidence of cortical infarction in the right occipital region. No mass effect or hemorrhage. None of the other acute infarctions visible by MRI are visible on CT. Generalized volume loss, somewhat premature for age. Electronically Signed   By: Nelson Chimes M.D.   On: 10/18/2019 13:24   US Abdomen Complete  Result Date: 10/19/2019 CLINICAL DATA:  Thrombocytopenia.  Chronic renal disease. EXAM: ABDOMEN ULTRASOUND COMPLETE COMPARISON:  No prior. FINDINGS: Gallbladder: Not visualized. Common bile duct: Diameter: Not visualized. Liver: Left lobe not visualized. Heterogeneous parenchymal pattern where visualized. No focal hepatic mass identified. Portal vein is patent on color Doppler imaging with normal direction of blood flow towards the liver. IVC: Not visualized. Pancreas: Visualized portion unremarkable. Spleen: Size and appearance within normal limits. Spleen measures 11.2 cm. Right Kidney: Length: 9.5 cm. Increased echogenicity consistent chronic medical renal disease. Mild right hydronephrosis. No mass noted. Left Kidney: Length: 10.4 cm. Increased echogenicity consistent chronic medical renal disease. Mild left hydronephrosis. No mass noted. Small amount of perinephric fluid. Abdominal aorta: Poorly visualized. Other findings: Ascites and bilateral pleural effusions noted. This was an extremely limited exam due to overlying bowel gas. IMPRESSION: 1. Extremely limited exam due to overlying bowel gas. Gallbladder and common bile duct not visualized. Left hepatic lobe not visualized. 2. Liver has a heterogeneous parenchymal pattern suggesting the possibility of fatty infiltration or hepatocellular disease. Spleen is normal in size. 3. Increased echogenicity both kidneys consistent chronic medical renal disease. Mild bilateral hydronephrosis. Small amount of left perinephric fluid noted. 4.  Ascites and  bilateral pleural effusions noted. Electronically Signed   By: Marcello Moores  Register   On: 10/19/2019 13:43   ECHOCARDIOGRAM COMPLETE  Result Date: 10/19/2019    ECHOCARDIOGRAM REPORT   Patient Name:   Chase [name] Date of Exam: 10/19/2019 Medical Rec #:  [id number]          Height:       68.0 in Accession #:    [id number]         Weight:       122.0 lb Date of Birth:  08/15/1965          BSA:          1.657 m Patient Age:    58 years           BP:           117/78 mmHg Patient Gender: M                  HR:           64 bpm. Exam Location:  Inpatient Procedure: 2D Echo, Cardiac Doppler and Color Doppler Indications:    Stroke  History:        Patient has no prior history of Echocardiogram examinations.                 Stroke. Elevated troponin.  Sonographer:    Roseanna Rainbow RDCS Referring Phys: [id number] RIPUDEEP K RAI  Sonographer Comments: Technically difficult study due to poor echo windows and no subcostal window. Difficult due to very thin habitus. Very low parasternal. No true subcostal view and could not visualize IVC. IMPRESSIONS  1. There is a mass like structure noted in the right pulmonary artery. This is seen on the suprasternal notch  views. Unable to exclude pulmonary embolism. Given elevated troponin, would consider CT PE study to exclude PE.  2. Left ventricular ejection fraction, by estimation, is 60 to 65%. The left ventricle has normal function. The left ventricle has no regional wall motion abnormalities. Left ventricular diastolic parameters are consistent with Grade I diastolic dysfunction (impaired relaxation).  3. Right ventricular systolic function is normal. The right ventricular size is normal. Tricuspid regurgitation signal is inadequate for assessing PA pressure.  4. There is a moderate pericardial effusion up to 1.5 cm over the anterior RV and LV apex. There is no RA/RV diastolic collapse. There is no respiratory variation in MV inflow. The IVC is now well visualized. There are no overt  echocardiographic signs of tamponade. Moderate pericardial effusion. The pericardial effusion is anterior to the right ventricle and surrounding the apex. There is no evidence of cardiac tamponade.  5. The mitral valve is grossly normal. Trivial mitral valve regurgitation. No evidence of mitral stenosis.  6. The aortic valve is tricuspid. Aortic valve regurgitation is not visualized. No aortic stenosis is present. FINDINGS  Left Ventricle: Left ventricular ejection fraction, by estimation, is 60 to 65%. The left ventricle has normal function. The left ventricle has no regional wall motion abnormalities. The left ventricular internal cavity size was normal in size. There is  no left ventricular hypertrophy. Left ventricular diastolic parameters are consistent with Grade I diastolic dysfunction (impaired relaxation). Right Ventricle: The right ventricular size is normal. No increase in right ventricular wall thickness. Right ventricular systolic function is normal. Tricuspid regurgitation signal is inadequate for assessing PA pressure. Left Atrium: Left atrial size was normal in size. Right Atrium: Right atrial size was normal in size. Pericardium: There is a moderate pericardial effusion up to 1.5 cm over the anterior RV and LV apex. There is no RA/RV diastolic collapse. There is no respiratory variation in MV inflow. The IVC is now well visualized. There are no overt echocardiographic signs of tamponade. A moderately sized pericardial effusion is present. The pericardial effusion is anterior to the right ventricle and surrounding the apex. The pericardial effusion appears to contain fibrous material. There is no evidence of cardiac tamponade. Presence of pericardial fat pad. Mitral Valve: The mitral valve is grossly normal. Trivial mitral valve regurgitation. No evidence of mitral valve stenosis. Tricuspid Valve: The tricuspid valve is grossly normal. Tricuspid valve regurgitation is trivial. No evidence of  tricuspid stenosis. Aortic Valve: The aortic valve is tricuspid. Aortic valve regurgitation is not visualized. No aortic stenosis is present. Pulmonic Valve: The pulmonic valve was grossly normal. Pulmonic valve regurgitation is not visualized. No evidence of pulmonic stenosis. Aorta: The aortic root is normal in size and structure. Venous: The inferior vena cava was not well visualized. IAS/Shunts: The atrial septum is grossly normal.  LEFT VENTRICLE PLAX 2D LVIDd:         4.20 cm      Diastology LVIDs:         2.80 cm      LV e' lateral:   11.10 cm/s LV PW:         1.20 cm      LV E/e' lateral: 4.2 LV IVS:        1.10 cm      LV e' medial:    6.64 cm/s LVOT diam:     2.00 cm      LV E/e' medial:  7.0 LV SV:         41 LV SV  Index:   25 LVOT Area:     3.14 cm  LV Volumes (MOD) LV vol d, MOD A2C: 116.0 ml LV vol d, MOD A4C: 93.8 ml LV vol s, MOD A2C: 43.3 ml LV vol s, MOD A4C: 39.7 ml LV SV MOD A2C:     72.7 ml LV SV MOD A4C:     93.8 ml LV SV MOD BP:      65.0 ml RIGHT VENTRICLE RV S prime:     18.10 cm/s TAPSE (M-mode): 2.8 cm LEFT ATRIUM           Index       RIGHT ATRIUM           Index LA diam:      3.80 cm 2.29 cm/m  RA Area:     12.00 cm LA Vol (A2C): 32.7 ml 19.74 ml/m RA Volume:   24.80 ml  14.97 ml/m LA Vol (A4C): 24.5 ml 14.79 ml/m  AORTIC VALVE LVOT Vmax:   60.10 cm/s LVOT Vmean:  36.000 cm/s LVOT VTI:    0.132 m  AORTA Ao Root diam: 4.10 cm MITRAL VALVE MV Area (PHT): 2.21 cm    SHUNTS MV Decel Time: 343 msec    Systemic VTI:  0.13 m MV E velocity: 46.20 cm/s  Systemic Diam: 2.00 cm MV A velocity: 45.10 cm/s MV E/A ratio:  1.02 Eleonore Chiquito MD Electronically signed by Eleonore Chiquito MD Signature Date/Time: 10/19/2019/5:35:14 PM    Final     Labs: BMET Recent Labs  Lab 10/17/19 1241 10/18/19 0013 10/19/19 0336 10/20/19 0618  NA 133*  137 135 136 137  K 3.1*  3.1* 3.5 3.5 3.8  CL 111  108 115* 117* 113*  CO2 13* 14* 14* 17*  GLUCOSE 82  79 88 95 92  BUN 42*  38* 41* 37* 32*   CREATININE 3.74*  4.10* 3.52* 3.41* 3.06*  CALCIUM 7.4* 7.4* 7.3* 7.2*  PHOS  --  5.1*  --   --    CBC Recent Labs  Lab 10/17/19 1241 10/17/19 1241 10/17/19 1311 10/18/19 0013 10/19/19 0336 10/20/19 0618  WBC 18.8*  --   --  11.7* 7.8 6.2  NEUTROABS 17.7*  --   --  10.9*  --   --   HGB 8.6*  9.2*  --   --  7.8* 7.3* 8.9*  HCT 26.5*  27.0*  --   --  25.2* 23.1* 28.0*  MCV 89.5  --   --  89.0 90.6 88.1  PLT 69*   < > 68* 61* 66* 61*   < > = values in this interval not displayed.    Medications:    . aspirin EC  81 mg Oral Daily  . Chlorhexidine Gluconate Cloth  6 each Topical Daily  . DULoxetine  30 mg Oral QHS  . DULoxetine  60 mg Oral Daily  . gabapentin  300 mg Oral BID  . midodrine  2.5 mg Oral TID  . pantoprazole  40 mg Oral BID AC  . pneumococcal 23 valent vaccine  0.5 mL Intramuscular Tomorrow-1000  . sodium bicarbonate  1,300 mg Oral TID  . tamsulosin  0.4 mg Oral Daily    Gean Quint, MD [facility] 10/20/2019, 9:59 AM

## 2019-10-20 NOTE — Progress Notes (Signed)
Did page MD to see if could have wound consult, as he keeps saying he needs a special dressing for his bottom.  Said he does not know what he had but was given it before and it really helped.  He did c/o pain to his coccyx/sacral area but when this nurse attempted to reposition, he refused.  I reminded him that he does need to get off of his bottom some.  He said he always lies in that position and it has not been a problem.  Did remind him that he has been saying his bottom is uncomfortable and I requested him to try to allow me to reposition him for a while.  When he did allow it, he stated that was instant relief.

## 2019-10-20 NOTE — Progress Notes (Signed)
Physical Therapy Treatment Patient Details Name: Chase [name] MRN: [id number] DOB: 1965/11/07 Today's Date: 10/20/2019    History of Present Illness Pt is a 54 y.o. male with PMH of cervical post-laminectomy syndrome, arthalgia of multiple sites, hodgkin's disease, PTSD, CKD, chronic pain syndrome, and anemia presenting to ED with L sided weakness, slurred speech, and fall. 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    PT Comments    Pt progressing towards physical therapy goals. Was able to perform transfers and ambulation with up to +2 mod assist. Pt appears to be actively moving the LUE/LLE more than yesterday and was able to advance the LLE during gait training without therapist assist today. Continue to feel this patient would benefit from continued rehab at the CIR level. Will continue to follow.    Follow Up Recommendations  CIR     Equipment Recommendations  None recommended by PT (TBD by next venue of care)    Recommendations for Other Services Rehab consult     Precautions / Restrictions Precautions Precautions: Fall Precaution Comments: pt describes a painful "cyst" on his sacral area which makes it difficult to get comfortable in the chair Restrictions Weight Bearing Restrictions: No    Mobility  Bed Mobility Overal bed mobility: Needs Assistance Bed Mobility: Supine to Sit     Supine to sit: Max assist;+2 for physical assistance Sit to supine: Mod assist   General bed mobility comments: To elevate trunk and advance lower legs off EOB. Reaching for railing with eyes closed and putting forth minimal effort in transition to sitting.   Transfers Overall transfer level: Needs assistance Equipment used: 2 person hand held assist Transfers: Sit to/from Stand Sit to Stand: Min assist;+2 physical assistance         General transfer comment: VC's for hand placement on seated surface for safety. Pt was able to assist some with  power-up to full stand, however +2 assist was required to achieve upright posture. VC's for forward gaze and full hip extension.   Ambulation/Gait Ambulation/Gait assistance: Mod assist;+2 physical assistance Gait Distance (Feet): 6 Feet Assistive device: 2 person hand held assist Gait Pattern/deviations: Step-to pattern;Decreased stride length;Trunk flexed;Decreased weight shift to left;Decreased stance time - left;Decreased dorsiflexion - left Gait velocity: Decreased Gait velocity interpretation: <1.31 ft/sec, indicative of household ambulator General Gait Details: Ambulated to chair. Therapist facilitated weight shift and pt was able to advance LLE.    Stairs             Wheelchair Mobility    Modified Rankin (Stroke Patients Only) Modified Rankin (Stroke Patients Only) Pre-Morbid Rankin Score: Moderate disability Modified Rankin: Moderately severe disability     Balance Overall balance assessment: Needs assistance Sitting-balance support: Bilateral upper extremity supported;Feet supported Sitting balance-Leahy Scale: Poor Sitting balance - Comments: at least MIIN A to maintan upright posture Postural control: Right lateral lean Standing balance support: Bilateral upper extremity supported;During functional activity Standing balance-Leahy Scale: Poor Standing balance comment: reliant on UE support and external assist                            Cognition Arousal/Alertness: Lethargic Behavior During Therapy: Flat affect (kept eyes closed most of session) Overall Cognitive Status: Impaired/Different from baseline Area of Impairment: Problem solving;Awareness;Following commands;Attention                   Current Attention Level: Selective   Following Commands: Follows one step  commands consistently;Follows one step commands with increased time   Awareness: Emergent Problem Solving: Slow processing;Decreased initiation;Difficulty  sequencing;Requires verbal cues;Requires tactile cues General Comments: very flat and slow to process. needing cues to open eyes and attend to session      Exercises General Exercises - Lower Extremity Long Arc Quad: AAROM;10 reps (eccentric lower and hold)    General Comments        Pertinent Vitals/Pain Pain Assessment: Faces Pain Score: 7  Faces Pain Scale: Hurts whole lot Pain Location: "cyst" on sacrum Pain Descriptors / Indicators: Discomfort;Grimacing;Guarding;Tender Pain Intervention(s): Limited activity within patient's tolerance;Monitored during session;Repositioned    Home Living                      Prior Function            PT Goals (current goals can now be found in the care plan section) Acute Rehab PT Goals Patient Stated Goal: Not have to depend on anyone  PT Goal Formulation: With patient Time For Goal Achievement: 11/01/19 Potential to Achieve Goals: Good Progress towards PT goals: Progressing toward goals    Frequency    Min 4X/week      PT Plan Current plan remains appropriate    Co-evaluation              AM-PAC PT "6 Clicks" Mobility   Outcome Measure  Help needed turning from your back to your side while in a flat bed without using bedrails?: A Lot Help needed moving from lying on your back to sitting on the side of a flat bed without using bedrails?: Total Help needed moving to and from a bed to a chair (including a wheelchair)?: Total Help needed standing up from a chair using your arms (e.g., wheelchair or bedside chair)?: A Lot Help needed to walk in hospital room?: Total Help needed climbing 3-5 steps with a railing? : Total 6 Click Score: 8    End of Session Equipment Utilized During Treatment: Gait belt Activity Tolerance: Patient limited by fatigue Patient left: in chair;with call bell/phone within reach;with chair alarm set Nurse Communication: Mobility status PT Visit Diagnosis: Unsteadiness on feet  (R26.81);Hemiplegia and hemiparesis Hemiplegia - Right/Left: Left Hemiplegia - dominant/non-dominant: Non-dominant Hemiplegia - caused by: Cerebral infarction     Time: 8016-5537 PT Time Calculation (min) (ACUTE ONLY): 30 min  Charges:  $Gait Training: 23-37 mins                     Rolinda Roan, PT, DPT Acute Rehabilitation Services Pager: [phone] Office: [phone]    Thelma Comp 10/20/2019, 2:01 PM

## 2019-10-20 NOTE — Plan of Care (Signed)

## 2019-10-20 NOTE — TOC Progression Note (Signed)
Transition of Care (TOC) - Progression Note   Or at SNF Patient Details  Name: Chase [name] MRN: [id number] Date of Birth: [birth year]  Transition of Care [facility]) CM/SW Contact  Angelita Ingles, RN Phone Number: [phone]  10/20/2019, 12:46 PM  Clinical Narrative:    Spoke with son Chase [name]. who states that the family is not able to provide 24 hour care and wishes to pursue in patient rehab bed at [facility] or SNF. Son states that family is not requesting a specific place just wants to be informed of bed availability.         Expected Discharge Plan and Services                                                 Social Determinants of Health (SDOH) Interventions    Readmission Risk Interventions No flowsheet data found.

## 2019-10-20 NOTE — Progress Notes (Addendum)
PROGRESS NOTE    Chase [name]  WUJ:[id number] DOB: 1966/03/20 DOA: 10/17/2019 PCP: [name]   Brief Narrative:  HPI on 10/17/2019 by Dr. [name] Patient is a 54 year old male with history of GERD, PTSD, CKD stage IV, history of cervical radiculopathy, chronic pain syndrome, follows pain clinic presented with left arm and left leg weakness since this morning.  Patient reports that he ambulates by himself with the help of a cane.  He last went to bed normal.  However when he woke up this morning, he was unable to move his left side.  He denied any headache, any blurry vision.  He also felt numbness and tingling in his left side.  Denied any prior history of stroke.  Patient reports that he he lives alone and called the EMS.  Denied any chest pain, shortness of breath, abdominal pain, hematochezia or melena. Also reports having dysuria, difficulty urinating, also has an appointment at [facility] for the same  Interim history Patient admitted with acute left-sided weakness, concern for acute stroke falls acute UTI, thrombocytopenia, with a tick bite.  Currently on doxycycline and ceftriaxone. 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  -Noninvasive angiography showed no LVO, or other significant atherosclerosis, carotids without stenosis -Echocardiogram EF of 60 to 95%, grade 1 diastolic dysfunction -Neurology consulted and appreciated -Patient with history of atrial fibrillation, currently not an anticoagulation candidate -TPA not given because thrombocytopenia as well as unknown onset of symptoms -Continue aspirin 81 mg -LDL 26, hemoglobin A1c 5.4 -PT recommended CIR however not in network with [facility].  TOC consulted  Acute thrombocytopenia-suspect secondary to tickborne  illness -Platelets currently stable, around 60,000 without any evidence of bleeding -Oncology/hematology and nephrology consulted and appreciated -TTP/pus relatively less likely than thrombocytopenia due to underlying infection (tickborne illness suspected, UTI) -Medication induced thrombocytopenia also unlikely -Abdominal ultrasound: Exam limited by overlying bowel gas.  Gallbladder and common bile duct not visualized.  Left hepatic lobe not visualized.  Liver has heterogeneous parenchymal pattern suggesting possibility of fatty infiltration or hepatocellular disease.  Spleen is normal size.  Increased echogenicity of both kidneys consistent with chronic medical renal disease.  Mild bilateral hydronephrosis. -LDH within normal range, Coombs test negative, reticulocytes not elevated.  ADAMTS13 33.9 -Continue to monitor CBC  Mass like Structure noted in right pulmonary artery -Noted on echocardiogram -Recommended CT PE study to exclude PE however patient does have CKD stage V at baseline -Will obtain VQ scan (patient also noted to have elevated D-dimer, 3.19) -Question if patient needs a TEE, will discuss with neurology  Elevated troponin -Troponins in the 300s -Question whether is due to CVA versus process noted as above -No complaints of chest pain at this time -As noted, will obtain VQ scan  Chronic kidney disease, stage V with metabolic acidosis -Creatinine at baseline approximately 3-4.5 -Nephrology consulted and appreciated -Continue bicarb  Urinary tract infection/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  Urology was consulted and patient was taken for cystoscopy showing  large 300 cc posterior bladder diverticulum, purulent contents, and urine culture was showing multiple species .  Urology recommended outpatient follow-up with possible diverticulectomy.  He was treated with ceftriaxone and then cefdinir on discharge. -Patient was complaining of baseline urinary hesitancy with dysuria -Urine culture shows multiple species -Currently on ceftriaxone -Patient need to follow-up with the [facility] urology for diverticulectomy as an outpatient  BPH -Continue Flomax  Hypoalbuminemia/severe protein calorie malnutrition -As evidenced by severe loss of subcutaneous muscle mass and fat -Likely secondary to chronic illness, poor oral intake -BMI 18.5, albumin 1.6 -Nutrition  PTSD/chronic pain syndrome -Continue duloxetine, gabapentin, tramadol  History of Hodgkin's lymphoma -Diagnosed in 2010 -Status post ABVD and radiation therapy which completed in 2011  Anemia of chronic disease/chronic B12 deficiency/history of gastric bypass -Hemoglobin down to 7.3, continue to monitor H&H -Continue B12 supplements -Work-up thus far shows a negative DAT, DH, haptoglobin -Was given 1 unit PRBC  Stage I sacral pressure injury -Present on admission -Continue wound care  DVT Prophylaxis  SCDs  Code Status: Full  Family Communication: None at bedside  Disposition Plan:  Status is: Inpatient  Remains inpatient appropriate because:Ongoing diagnostic testing needed not appropriate for outpatient work up, Unsafe d/c plan and Inpatient level of care appropriate due to severity of illness. Pending workup for CVA.    Dispo:  Patient From: Home  Planned Disposition: [place]  Expected discharge date: 2-3 days  Medically stable for discharge: No  Consultants Neurology  Nephrology Hematology PMR/CIR  Procedures  Echocardiogram Abdominal US  Antibiotics   Anti-infectives (From admission, onward)   Start     Dose/Rate Route Frequency Ordered Stop    10/18/19 1300  doxycycline (VIBRAMYCIN) 100 mg in sodium chloride 0.9 % 250 mL IVPB     Discontinue     100 mg 125 mL/hr over 120 Minutes Intravenous Every 12 hours 10/18/19 1157     10/18/19 1300  cefTRIAXone (ROCEPHIN) 2 g in sodium chloride 0.9 % 100 mL IVPB     Discontinue     2 g 200 mL/hr over 30 Minutes Intravenous Every 24 hours 10/18/19 1157     10/17/19 2000  doxycycline (VIBRA-TABS) tablet 100 mg  Status:  Discontinued        100 mg Oral Every 12 hours 10/17/19 1832 10/18/19 1157   10/17/19 1415  cefTRIAXone (ROCEPHIN) 2 g in sodium chloride 0.9 % 100 mL IVPB        2 g 200 mL/hr over 30 Minutes Intravenous  Once 10/17/19 1408 10/17/19 1611      Subjective:   [name] [name] seen and examined today.  Patient not very interactive this morning.  Denies current chest pain or shortness of breath, abdominal pain, nausea or vomiting, diarrhea constipation, dizziness or headache.  Objective:   Vitals:   10/20/19 0020 10/20/19 0400 10/20/19 0804 10/20/19 0853  BP:  121/78 135/75   Pulse:  (!) 56 62 71  Resp: 11 (!) 8 11   Temp:  98.3 F (36.8 C) 97.8 F (36.6 C)   TempSrc:  Oral Oral   SpO2:  99% 100%   Weight:      Height:        Intake/Output Summary (Last 24 hours) at 10/20/2019 1221 Last data filed at 10/20/2019 0800 Gross per 24 hour  Intake 1315 ml  Output 2050 ml  Net -735 ml   Filed Weights   10/17/19 1216  Weight: 55.3 kg    Exam  General: Well developed, chronically ill-appearing, NAD  HEENT: NCAT, mucous membranes moist.  Temporal wasting  Cardiovascular: S1 S2  auscultated, no murmurs, RRR  Respiratory: Clear to auscultation bilaterally with equal chest rise  Abdomen: Soft, nontender, nondistended, + bowel sounds  Extremities: warm dry without cyanosis clubbing or edema.  Petechiae on lower extremities bilaterally  Neuro: AAOx3, left-sided weakness, otherwise nonfocal  Psych: appropriate mood and affect   Data Reviewed: I have personally  reviewed following labs and imaging studies  CBC: Recent Labs  Lab 10/17/19 1241 10/17/19 1311 10/18/19 0013 10/19/19 0336 10/20/19 0618  WBC 18.8*  --  11.7* 7.8 6.2  NEUTROABS 17.7*  --  10.9*  --   --   HGB 8.6*  9.2*  --  7.8* 7.3* 8.9*  HCT 26.5*  27.0*  --  25.2* 23.1* 28.0*  MCV 89.5  --  89.0 90.6 88.1  PLT 69* 68* 61* 66* 61*   Basic Metabolic Panel: Recent Labs  Lab 10/17/19 1241 10/18/19 0013 10/19/19 0336 10/20/19 0618  NA 133*  137 135 136 137  K 3.1*  3.1* 3.5 3.5 3.8  CL 111  108 115* 117* 113*  CO2 13* 14* 14* 17*  GLUCOSE 82  79 88 95 92  BUN 42*  38* 41* 37* 32*  CREATININE 3.74*  4.10* 3.52* 3.41* 3.06*  CALCIUM 7.4* 7.4* 7.3* 7.2*  PHOS  --  5.1*  --   --    GFR: Estimated Creatinine Clearance: 21.3 mL/min (A) (by C-G formula based on SCr of 3.06 mg/dL (H)). Liver Function Tests: Recent Labs  Lab 10/17/19 1241 10/18/19 0013 10/19/19 0336 10/20/19 0618  AST 18  --  24 26  ALT 18  --  19 19  ALKPHOS 96  --  86 94  BILITOT 0.4  --  0.7 0.5  PROT 5.6*  --  4.6* 5.0*  ALBUMIN 2.0* 1.6* 1.6* 1.6*   No results for input(s): LIPASE, AMYLASE in the last 168 hours. No results for input(s): AMMONIA in the last 168 hours. Coagulation Profile: Recent Labs  Lab 10/17/19 1241 10/17/19 1311  INR 1.5* 1.6*   Cardiac Enzymes: No results for input(s): CKTOTAL, CKMB, CKMBINDEX, TROPONINI in the last 168 hours. BNP (last 3 results) No results for input(s): PROBNP in the last 8760 hours. HbA1C: Recent Labs    10/18/19 0013  HGBA1C 5.4   CBG: Recent Labs  Lab 10/17/19 1212  GLUCAP 64*   Lipid Profile: Recent Labs    10/18/19 0013  CHOL 60  HDL 21*  LDLCALC 26  TRIG 67  CHOLHDL 2.9   Thyroid Function Tests: No results for input(s): TSH, T4TOTAL, FREET4, T3FREE, THYROIDAB in the last 72 hours. Anemia Panel: Recent Labs    10/18/19 1913  RETICCTPCT 1.3   Urine analysis:    Component Value Date/Time   COLORURINE YELLOW  10/17/2019 1238   APPEARANCEUR CLOUDY (A) 10/17/2019 1238   LABSPEC 1.008 10/17/2019 1238   PHURINE 7.0 10/17/2019 1238   GLUCOSEU NEGATIVE 10/17/2019 1238   HGBUR LARGE (A) 10/17/2019 1238   BILIRUBINUR NEGATIVE 10/17/2019 1238   KETONESUR NEGATIVE 10/17/2019 1238   PROTEINUR 30 (A) 10/17/2019 1238   NITRITE NEGATIVE 10/17/2019 1238   LEUKOCYTESUR LARGE (A) 10/17/2019 1238   Sepsis Labs: @LABRCNTIP (procalcitonin:4,lacticidven:4)  ) Recent Results (from the past 240 hour(s))  SARS Coronavirus 2 by RT PCR (hospital order, performed in [place] hospital lab) Nasopharyngeal Nasopharyngeal Swab     Status: None   Collection Time: 10/17/19  1:14 PM   Specimen: Nasopharyngeal Swab  Result Value Ref Range Status   SARS Coronavirus 2  NEGATIVE NEGATIVE Final    Comment: (NOTE) SARS-CoV-2 target nucleic acids are NOT DETECTED.  The SARS-CoV-2 RNA is generally detectable in upper and lower respiratory specimens during the acute phase of infection. The lowest concentration of SARS-CoV-2 viral copies this assay can detect is 250 copies / mL. A negative result does not preclude SARS-CoV-2 infection and should not be used as the sole basis for treatment or other patient management decisions.  A negative result may occur with improper specimen collection / handling, submission of specimen other than nasopharyngeal swab, presence of viral mutation(s) within the areas targeted by this assay, and inadequate number of viral copies (<250 copies / mL). A negative result must be combined with clinical observations, patient history, and epidemiological information.  Fact Sheet for Patients:   StrictlyIdeas.no  Fact Sheet for Healthcare Providers: BankingDealers.co.za  This test is not yet approved or  cleared by the [place] FDA and has been authorized for detection and/or diagnosis of SARS-CoV-2 by FDA under an Emergency Use Authorization  (EUA).  This EUA will remain in effect (meaning this test can be used) for the duration of the COVID-19 declaration under Section 564(b)(1) of the Act, 21 U.S.C. section 360bbb-3(b)(1), unless the authorization is terminated or revoked sooner.  Performed at [facility], [address] [place], [place] [address]   Blood culture (routine x 2)     Status: None (Preliminary result)   Collection Time: 10/17/19  2:26 PM   Specimen: Left Antecubital; Blood  Result Value Ref Range Status   Specimen Description   Final    LEFT ANTECUBITAL BOTTLES DRAWN AEROBIC AND ANAEROBIC   Special Requests Blood Culture adequate volume  Final   Culture   Final    NO GROWTH 3 DAYS Performed at [facility], [address], [place], [place] [address]    Report Status PENDING  Incomplete  Urine culture     Status: Abnormal   Collection Time: 10/17/19  2:37 PM   Specimen: Urine, Catheterized  Result Value Ref Range Status   Specimen Description   Final    URINE, CATHETERIZED Performed at [facility], [address], [place], [place] [address]    Special Requests   Final    NONE Performed at [facility], [address], [place], [place] [address]    Culture MULTIPLE SPECIES PRESENT, SUGGEST RECOLLECTION (A)  Final   Report Status 10/19/2019 FINAL  Final  Blood culture (routine x 2)     Status: None (Preliminary result)   Collection Time: 10/17/19  2:41 PM   Specimen: Right Antecubital; Blood  Result Value Ref Range Status   Specimen Description   Final    RIGHT ANTECUBITAL BOTTLES DRAWN AEROBIC AND ANAEROBIC   Special Requests Blood Culture adequate volume  Final   Culture   Final    NO GROWTH 3 DAYS Performed at [facility], [address] [place], [place] [address]    Report Status PENDING  Incomplete      Radiology Studies: CT HEAD WO CONTRAST  Result Date: 10/18/2019 CLINICAL DATA:  Follow-up stroke. [name] and numbness. Scattered small infarctions by MRI. EXAM: CT HEAD  WITHOUT CONTRAST TECHNIQUE: Contiguous axial images were obtained from the base of the skull through the vertex without intravenous contrast. COMPARISON:  10/17/2019 FINDINGS: Brain: No acute finding affecting the brainstem or cerebellum by CT. Cerebral hemispheres show generalized atrophy, somewhat premature for age. Subtle evidence cortical infarction in the right occipital lobe. No mass effect or hemorrhage. Other  small infarctions seen by MRI not visible by CT. No hemorrhage, hydrocephalus or extra-axial collection. Vascular: There is atherosclerotic calcification of the major vessels at the base of the brain. Skull: Negative Sinuses/Orbits: Clear/normal Other: None IMPRESSION: Subtle evidence of cortical infarction in the right occipital region. No mass effect or hemorrhage. None of the other acute infarctions visible by MRI are visible on CT. Generalized volume loss, somewhat premature for age. Electronically Signed   By: Nelson Chimes M.D.   On: 10/18/2019 13:24   US Abdomen Complete  Result Date: 10/19/2019 CLINICAL DATA:  Thrombocytopenia.  Chronic renal disease. EXAM: ABDOMEN ULTRASOUND COMPLETE COMPARISON:  No prior. FINDINGS: Gallbladder: Not visualized. Common bile duct: Diameter: Not visualized. Liver: Left lobe not visualized. Heterogeneous parenchymal pattern where visualized. No focal hepatic mass identified. Portal vein is patent on color Doppler imaging with normal direction of blood flow towards the liver. IVC: Not visualized. Pancreas: Visualized portion unremarkable. Spleen: Size and appearance within normal limits. Spleen measures 11.2 cm. Right Kidney: Length: 9.5 cm. Increased echogenicity consistent chronic medical renal disease. Mild right hydronephrosis. No mass noted. Left Kidney: Length: 10.4 cm. Increased echogenicity consistent chronic medical renal disease. Mild left hydronephrosis. No mass noted. Small amount of perinephric fluid. Abdominal aorta: Poorly visualized. Other  findings: Ascites and bilateral pleural effusions noted. This was an extremely limited exam due to overlying bowel gas. IMPRESSION: 1. Extremely limited exam due to overlying bowel gas. Gallbladder and common bile duct not visualized. Left hepatic lobe not visualized. 2. Liver has a heterogeneous parenchymal pattern suggesting the possibility of fatty infiltration or hepatocellular disease. Spleen is normal in size. 3. Increased echogenicity both kidneys consistent chronic medical renal disease. Mild bilateral hydronephrosis. Small amount of left perinephric fluid noted. 4.  Ascites and bilateral pleural effusions noted. Electronically Signed   By: Marcello Moores  Register   On: 10/19/2019 13:43   ECHOCARDIOGRAM COMPLETE  Result Date: 10/19/2019    ECHOCARDIOGRAM REPORT   Patient Name:   Chase [name] Date of Exam: 10/19/2019 Medical Rec #:  [id number]          Height:       68.0 in Accession #:    [id number]         Weight:       122.0 lb Date of Birth:  01-05-1966          BSA:          1.657 m Patient Age:    27 years           BP:           117/78 mmHg Patient Gender: M                  HR:           64 bpm. Exam Location:  Inpatient Procedure: 2D Echo, Cardiac Doppler and Color Doppler Indications:    Stroke  History:        Patient has no prior history of Echocardiogram examinations.                 Stroke. Elevated troponin.  Sonographer:    Roseanna Rainbow RDCS Referring Phys: [id number] RIPUDEEP K RAI  Sonographer Comments: Technically difficult study due to poor echo windows and no subcostal window. Difficult due to very thin habitus. Very low parasternal. No true subcostal view and could not visualize IVC. IMPRESSIONS  1. There is a mass like structure noted in the right pulmonary artery. This is seen  on the suprasternal notch views. Unable to exclude pulmonary embolism. Given elevated troponin, would consider CT PE study to exclude PE.  2. Left ventricular ejection fraction, by estimation, is 60 to 65%. The left  ventricle has normal function. The left ventricle has no regional wall motion abnormalities. Left ventricular diastolic parameters are consistent with Grade I diastolic dysfunction (impaired relaxation).  3. Right ventricular systolic function is normal. The right ventricular size is normal. Tricuspid regurgitation signal is inadequate for assessing PA pressure.  4. There is a moderate pericardial effusion up to 1.5 cm over the anterior RV and LV apex. There is no RA/RV diastolic collapse. There is no respiratory variation in MV inflow. The IVC is now well visualized. There are no overt echocardiographic signs of tamponade. Moderate pericardial effusion. The pericardial effusion is anterior to the right ventricle and surrounding the apex. There is no evidence of cardiac tamponade.  5. The mitral valve is grossly normal. Trivial mitral valve regurgitation. No evidence of mitral stenosis.  6. The aortic valve is tricuspid. Aortic valve regurgitation is not visualized. No aortic stenosis is present. FINDINGS  Left Ventricle: Left ventricular ejection fraction, by estimation, is 60 to 65%. The left ventricle has normal function. The left ventricle has no regional wall motion abnormalities. The left ventricular internal cavity size was normal in size. There is  no left ventricular hypertrophy. Left ventricular diastolic parameters are consistent with Grade I diastolic dysfunction (impaired relaxation). Right Ventricle: The right ventricular size is normal. No increase in right ventricular wall thickness. Right ventricular systolic function is normal. Tricuspid regurgitation signal is inadequate for assessing PA pressure. Left Atrium: Left atrial size was normal in size. Right Atrium: Right atrial size was normal in size. Pericardium: There is a moderate pericardial effusion up to 1.5 cm over the anterior RV and LV apex. There is no RA/RV diastolic collapse. There is no respiratory variation in MV inflow. The IVC is now  well visualized. There are no overt echocardiographic signs of tamponade. A moderately sized pericardial effusion is present. The pericardial effusion is anterior to the right ventricle and surrounding the apex. The pericardial effusion appears to contain fibrous material. There is no evidence of cardiac tamponade. Presence of pericardial fat pad. Mitral Valve: The mitral valve is grossly normal. Trivial mitral valve regurgitation. No evidence of mitral valve stenosis. Tricuspid Valve: The tricuspid valve is grossly normal. Tricuspid valve regurgitation is trivial. No evidence of tricuspid stenosis. Aortic Valve: The aortic valve is tricuspid. Aortic valve regurgitation is not visualized. No aortic stenosis is present. Pulmonic Valve: The pulmonic valve was grossly normal. Pulmonic valve regurgitation is not visualized. No evidence of pulmonic stenosis. Aorta: The aortic root is normal in size and structure. Venous: The inferior vena cava was not well visualized. IAS/Shunts: The atrial septum is grossly normal.  LEFT VENTRICLE PLAX 2D LVIDd:         4.20 cm      Diastology LVIDs:         2.80 cm      LV e' lateral:   11.10 cm/s LV PW:         1.20 cm      LV E/e' lateral: 4.2 LV IVS:        1.10 cm      LV e' medial:    6.64 cm/s LVOT diam:     2.00 cm      LV E/e' medial:  7.0 LV SV:  41 LV SV Index:   25 LVOT Area:     3.14 cm  LV Volumes (MOD) LV vol d, MOD A2C: 116.0 ml LV vol d, MOD A4C: 93.8 ml LV vol s, MOD A2C: 43.3 ml LV vol s, MOD A4C: 39.7 ml LV SV MOD A2C:     72.7 ml LV SV MOD A4C:     93.8 ml LV SV MOD BP:      65.0 ml RIGHT VENTRICLE RV S prime:     18.10 cm/s TAPSE (M-mode): 2.8 cm LEFT ATRIUM           Index       RIGHT ATRIUM           Index LA diam:      3.80 cm 2.29 cm/m  RA Area:     12.00 cm LA Vol (A2C): 32.7 ml 19.74 ml/m RA Volume:   24.80 ml  14.97 ml/m LA Vol (A4C): 24.5 ml 14.79 ml/m  AORTIC VALVE LVOT Vmax:   60.10 cm/s LVOT Vmean:  36.000 cm/s LVOT VTI:    0.132 m  AORTA Ao  Root diam: 4.10 cm MITRAL VALVE MV Area (PHT): 2.21 cm    SHUNTS MV Decel Time: 343 msec    Systemic VTI:  0.13 m MV E velocity: 46.20 cm/s  Systemic Diam: 2.00 cm MV A velocity: 45.10 cm/s MV E/A ratio:  1.02 Eleonore Chiquito MD Electronically signed by Eleonore Chiquito MD Signature Date/Time: 10/19/2019/5:35:14 PM    Final      Scheduled Meds: . aspirin EC  81 mg Oral Daily  . Chlorhexidine Gluconate Cloth  6 each Topical Daily  . DULoxetine  30 mg Oral QHS  . DULoxetine  60 mg Oral Daily  . gabapentin  300 mg Oral BID  . midodrine  2.5 mg Oral TID  . pantoprazole  40 mg Oral BID AC  . pneumococcal 23 valent vaccine  0.5 mL Intramuscular Tomorrow-1000  . sodium bicarbonate  1,300 mg Oral TID  . tamsulosin  0.4 mg Oral Daily   Continuous Infusions: . cefTRIAXone (ROCEPHIN)  IV 2 g (10/19/19 1436)  . doxycycline (VIBRAMYCIN) IV 100 mg (10/20/19 0112)     LOS: 3 days   Time Spent in minutes   45 minutes  Lenise Jr D.O. on 10/20/2019 at 12:21 PM  Between 7am to 7pm - Please see pager noted on [email or web address]  After 7pm go to [email or web address]  And look for the night coverage person covering for me after hours  [facility] Office  [phone]

## 2019-10-21 DIAGNOSIS — N184 Chronic kidney disease, stage 4 (severe): Secondary | ICD-10-CM | POA: Diagnosis not present

## 2019-10-21 DIAGNOSIS — G8921 Chronic pain due to trauma: Secondary | ICD-10-CM | POA: Diagnosis not present

## 2019-10-21 DIAGNOSIS — W19XXXA Unspecified fall, initial encounter: Secondary | ICD-10-CM | POA: Diagnosis not present

## 2019-10-21 DIAGNOSIS — N390 Urinary tract infection, site not specified: Secondary | ICD-10-CM | POA: Diagnosis not present

## 2019-10-21 LAB — BASIC METABOLIC PANEL
Anion gap: 7 (ref 5–15)
BUN: 31 mg/dL — ABNORMAL HIGH (ref 6–20)
CO2: 17 mmol/L — ABNORMAL LOW (ref 22–32)
Calcium: 7.2 mg/dL — ABNORMAL LOW (ref 8.9–10.3)
Chloride: 112 mmol/L — ABNORMAL HIGH (ref 98–111)
Creatinine, Ser: 2.81 mg/dL — ABNORMAL HIGH (ref 0.61–1.24)
GFR calc Af Amer: 28 mL/min — ABNORMAL LOW (ref >60)
GFR calc non Af Amer: 24 mL/min — ABNORMAL LOW (ref >60)
Glucose, Bld: 95 mg/dL (ref 70–99)
Potassium: 3.7 mmol/L (ref 3.5–5.1)
Sodium: 136 mmol/L (ref 135–145)

## 2019-10-21 LAB — CBC
HCT: 29 % — ABNORMAL LOW (ref 39.0–52.0)
Hemoglobin: 9.1 g/dL — ABNORMAL LOW (ref 13.0–17.0)
MCH: 27.7 pg (ref 26.0–34.0)
MCHC: 31.4 g/dL (ref 30.0–36.0)
MCV: 88.4 fL (ref 80.0–100.0)
Platelets: 66 10*3/uL — ABNORMAL LOW (ref 150–400)
RBC: 3.28 MIL/uL — ABNORMAL LOW (ref 4.22–5.81)
RDW: 17.9 % — ABNORMAL HIGH (ref 11.5–15.5)
WBC: 6 10*3/uL (ref 4.0–10.5)
nRBC: 0 % (ref 0.0–0.2)

## 2019-10-21 MED ORDER — SODIUM CHLORIDE 0.9 % IV SOLN: INTRAVENOUS | Status: AC

## 2019-10-21 NOTE — Progress Notes (Signed)
Physical Therapy Treatment Patient Details Name: Chase [name] MRN: [id number] DOB: 1966/02/08 Today's Date: 10/21/2019    History of Present Illness Pt is a 54 y.o. male with PMH of cervical post-laminectomy syndrome, arthalgia of multiple sites, hodgkin's disease, PTSD, CKD, chronic pain syndrome, and anemia presenting to ED with L sided weakness, slurred speech, and fall. 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    PT Comments    Pt was lethargic throughout, agreeable to participation, voiced embarrassment with his mobility status.  He was unable to sit upright without min to moderate assist, but was able to use UE's to scoot to edge of the chair to ready for stand.  Emphasis on transition to EOB, sitting balance, sit to stand, pre gait and gait training with  RW.   Follow Up Recommendations  CIR     Equipment Recommendations  None recommended by PT    Recommendations for Other Services       Precautions / Restrictions Precautions Precautions: Fall Precaution Comments: Stage 3 coccyx wound per WOC note    Mobility  Bed Mobility Overal bed mobility: Needs Assistance Bed Mobility: Supine to Sit     Supine to sit: Mod assist;+2 for physical assistance        Transfers Overall transfer level: Needs assistance Equipment used: Ambulation equipment used;Rolling walker (2 wheeled) Transfers: Sit to/from Stand Sit to Stand: Min assist;+2 physical assistance;Mod assist         General transfer comment: cues for hand placement.  assist forward and variable levels of boost  Ambulation/Gait Ambulation/Gait assistance: Mod assist;+2 physical assistance Gait Distance (Feet): 8 Feet (x2) Assistive device: Rolling walker (2 wheeled) Gait Pattern/deviations: Step-to pattern;Decreased step length - right;Decreased step length - left;Decreased stride length Gait velocity: Decreased Gait velocity interpretation: <1.31 ft/sec, indicative of  household ambulator General Gait Details: paretic gait with difficulty advancing L LE, but less difficulty w/shifting onto the L to allow advancing the R LE.  Pt very quick to fatigue, visibly slumping in his trunk and becoming difficult to stabilize his trunk at 5-8 feet.   Stairs             Wheelchair Mobility    Modified Rankin (Stroke Patients Only) Modified Rankin (Stroke Patients Only) Pre-Morbid Rankin Score: Moderate disability Modified Rankin: Moderately severe disability     Balance Overall balance assessment: Needs assistance Sitting-balance support: Bilateral upper extremity supported;Feet supported Sitting balance-Leahy Scale: Poor Sitting balance - Comments: slumped posture with assist to attain submaximal urpright sitting   Standing balance support: Bilateral upper extremity supported;During functional activity Standing balance-Leahy Scale: Poor Standing balance comment: reliant on UE support and external assist                            Cognition Arousal/Alertness: Lethargic;Awake/alert Behavior During Therapy: Flat affect Overall Cognitive Status: Impaired/Different from baseline                         Following Commands: Follows one step commands with increased time   Awareness: Emergent Problem Solving: Slow processing;Decreased initiation;Difficulty sequencing;Requires verbal cues;Requires tactile cues General Comments: flat affect and slow to process.  "I'm embarrased"      Exercises Other Exercises Other Exercises: Warm up Hip/Knee ROM with graded resistance in gross extension    General Comments        Pertinent Vitals/Pain Pain Assessment: Faces Faces Pain Scale: Hurts even  more Pain Location: coccyx/buttock Pain Descriptors / Indicators: Discomfort;Grimacing;Guarding;Tender Pain Intervention(s): Monitored during session;Repositioned    Home Living                      Prior Function             PT Goals (current goals can now be found in the care plan section) Acute Rehab PT Goals PT Goal Formulation: With patient Time For Goal Achievement: 11/01/19 Potential to Achieve Goals: Good Progress towards PT goals: Progressing toward goals    Frequency    Min 4X/week      PT Plan Current plan remains appropriate    Co-evaluation              AM-PAC PT "6 Clicks" Mobility   Outcome Measure  Help needed turning from your back to your side while in a flat bed without using bedrails?: A Lot Help needed moving from lying on your back to sitting on the side of a flat bed without using bedrails?: A Lot Help needed moving to and from a bed to a chair (including a wheelchair)?: A Lot Help needed standing up from a chair using your arms (e.g., wheelchair or bedside chair)?: A Lot Help needed to walk in hospital room?: A Lot Help needed climbing 3-5 steps with a railing? : Total 6 Click Score: 11    End of Session   Activity Tolerance: Patient limited by fatigue Patient left: in chair;with call bell/phone within reach;with chair alarm set Nurse Communication: Mobility status PT Visit Diagnosis: Unsteadiness on feet (R26.81);Hemiplegia and hemiparesis;Muscle weakness (generalized) (M62.81) Hemiplegia - Right/Left: Left Hemiplegia - dominant/non-dominant: Non-dominant Hemiplegia - caused by: Cerebral infarction     Time: 1527-1600 PT Time Calculation (min) (ACUTE ONLY): 33 min  Charges:  $Gait Training: 8-22 mins $Therapeutic Activity: 8-22 mins                     10/21/2019  Ginger Carne., PT Acute Rehabilitation Services [phone]  (pager) [phone]  (office)   Tessie Fass Raiza Kiesel 10/21/2019, 5:55 PM

## 2019-10-21 NOTE — Progress Notes (Signed)
PROGRESS NOTE    Chase [name]  GDJ:[id number] DOB: 11-30-1965 DOA: 10/17/2019 PCP: [facility], [name]   Brief Narrative:  HPI on 10/17/2019 by Dr. [name] Patient is a 54 year old male with history of GERD, PTSD, CKD stage IV, history of cervical radiculopathy, chronic pain syndrome, follows pain clinic presented with left arm and left leg weakness since this morning.  Patient reports that he ambulates by himself with the help of a cane.  He last went to bed normal.  However when he woke up this morning, he was unable to move his left side.  He denied any headache, any blurry vision.  He also felt numbness and tingling in his left side.  Denied any prior history of stroke.  Patient reports that he he lives alone and called the EMS.  Denied any chest pain, shortness of breath, abdominal pain, hematochezia or melena. Also reports having dysuria, difficulty urinating, also has an appointment at [facility] for the same  Interim history Patient admitted with acute left-sided weakness, concern for acute stroke falls acute UTI, thrombocytopenia, with a tick bite.  Currently on doxycycline and ceftriaxone. 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  -Noninvasive angiography showed no LVO, or other significant atherosclerosis, carotids without stenosis -Echocardiogram EF of 60 to 62%, grade 1 diastolic dysfunction -Neurology consulted and appreciated -Patient with history of atrial fibrillation, currently not an anticoagulation candidate -TPA not given because thrombocytopenia as well as unknown onset of symptoms -Continue aspirin 81 mg -LDL 26, hemoglobin A1c 5.4 -PT recommended CIR however not in network with [facility].  TOC consulted  Acute thrombocytopenia-suspect secondary to tickborne  illness -Platelets currently stable, around 60,000 without any evidence of bleeding -Oncology/hematology and nephrology consulted and appreciated -TTP/pus relatively less likely than thrombocytopenia due to underlying infection (tickborne illness suspected, UTI) -Medication induced thrombocytopenia also unlikely -Abdominal ultrasound: Exam limited by overlying bowel gas.  Gallbladder and common bile duct not visualized.  Left hepatic lobe not visualized.  Liver has heterogeneous parenchymal pattern suggesting possibility of fatty infiltration or hepatocellular disease.  Spleen is normal size.  Increased echogenicity of both kidneys consistent with chronic medical renal disease.  Mild bilateral hydronephrosis. -LDH within normal range, Coombs test negative, reticulocytes not elevated.  ADAMTS13 33.9 -Continue to monitor CBC  Mass like Structure noted in right pulmonary artery -Noted on echocardiogram -Recommended CT PE study to exclude PE however patient does have CKD stage V at baseline -patient also noted to have elevated D-dimer, 3.19- obtained V/Q scan, which was normal -Question if patient needs a TEE- discussed this with neurology on 7/21, did not feel that TEE was indicated from stroke perspective  -Discussed obtaining CTA/PE study with nephrology.  Recommended pre and post hydration with normal saline at 100 cc/h for 6 hours prior, 6 hours post.  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  Urology was consulted and patient was taken for cystoscopy showing large 300 cc posterior bladder diverticulum, purulent contents, and urine culture was showing multiple species .  Urology recommended outpatient follow-up with possible diverticulectomy.  He was treated with ceftriaxone and then cefdinir on discharge. -Patient was complaining of baseline urinary hesitancy with dysuria -Urine culture shows multiple species -Currently on ceftriaxone -Patient need to follow-up with the [facility] urology for diverticulectomy as an outpatient  BPH -Continue Flomax  Hypoalbuminemia/severe protein calorie malnutrition -As evidenced by severe loss of subcutaneous muscle mass and fat -Likely secondary to chronic illness, poor oral intake -BMI 18.5, albumin 1.6 -Nutrition consulted  PTSD/chronic pain syndrome -Continue duloxetine, gabapentin, tramadol  History of Hodgkin's lymphoma -Diagnosed in 2010 -Status post ABVD and radiation therapy which completed in 2011  Anemia of chronic disease/chronic B12 deficiency/history of gastric bypass -Hemoglobin was down to 7.3, and patient was given 1u PRBC -hemoglobin currently 9.1 -Continue B12 supplements -Work-up thus far shows a negative DAT, LDH, haptoglobin  Stage I sacral pressure injury -Present on admission -Continue wound care  DVT Prophylaxis  SCDs  Code Status: Full  Family Communication: None at bedside  Disposition Plan:  Status is: Inpatient  Remains inpatient appropriate because:Ongoing diagnostic testing needed not appropriate for outpatient work up, Unsafe d/c plan and Inpatient level of care appropriate due to severity of illness. Pending workup for CVA.    Dispo:  Patient From:  Home  Planned Disposition: [place]  Expected discharge date: 2-3 days  Medically stable for discharge: No  Consultants Neurology  Nephrology Hematology PMR/CIR  Procedures  Echocardiogram Abdominal US  Antibiotics   Anti-infectives (From admission, onward)   Start     Dose/Rate Route Frequency Ordered Stop   10/18/19 1300  doxycycline (VIBRAMYCIN) 100 mg in sodium chloride 0.9 % 250 mL IVPB     Discontinue     100 mg 125 mL/hr over 120 Minutes Intravenous Every 12 hours 10/18/19 1157     10/18/19 1300  cefTRIAXone (ROCEPHIN) 2 g in sodium chloride 0.9 % 100 mL IVPB     Discontinue     2 g 200 mL/hr over 30 Minutes Intravenous Every 24 hours 10/18/19 1157     10/17/19 2000  doxycycline (VIBRA-TABS) tablet 100 mg  Status:  Discontinued        100 mg Oral Every 12 hours 10/17/19 1832 10/18/19 1157   10/17/19 1415  cefTRIAXone (ROCEPHIN) 2 g in sodium chloride 0.9 % 100 mL IVPB        2 g 200 mL/hr over 30 Minutes Intravenous  Once 10/17/19 1408 10/17/19 1611      Subjective:   Chase [name] seen and examined today.  Patient not very interactive this morning.  Denies chest pain or shortness of breath.  Does not feel very well but cannot explain more.  Denies abdominal pain, nausea or vomiting, diarrhea or constipation, dizziness or headache.  Complains about his bottom, however will not turned over to allow me to examine.   Objective:   Vitals:   10/21/19 0356 10/21/19 0728 10/21/19 0740 10/21/19 1214  BP: 139/78 127/86  117/85  Pulse: (!) 54 60 64 65  Resp: 16 16  20   Temp: 98.3 F (36.8 C) 97.7 F (36.5 C)  98.7 F (37.1 C)  TempSrc: Oral Axillary  Oral  SpO2:  100% 100% 99% 100%  Weight:      Height:        Intake/Output Summary (Last 24 hours) at 10/21/2019 1259 Last data filed at 10/21/2019 2778 Gross per 24 hour  Intake 1106.05 ml  Output 2960 ml  Net -1853.95 ml   Filed Weights   10/17/19 1216  Weight: 55.3 kg   Exam  General: Well  developed, chronically ill appearing, NAD  HEENT: NCAT, mucous membranes moist.  Temporal wasting  Cardiovascular: S1 S2 auscultated, RRR, no murmur  Respiratory: Clear to auscultation bilaterally  Abdomen: Soft, nontender, nondistended, + bowel sounds  Extremities: warm dry without cyanosis clubbing or edema.  Petechiae on lower extremities bilaterally.  Neuro: AAOx3, left sided weakness, otherwise nonfocal  Psych: Appropriate mood and affect  Data Reviewed: I have personally reviewed following labs and imaging studies  CBC: Recent Labs  Lab 10/17/19 1241 10/17/19 1241 10/17/19 1311 10/18/19 0013 10/19/19 0336 10/20/19 0618 10/21/19 0449  WBC 18.8*  --   --  11.7* 7.8 6.2 6.0  NEUTROABS 17.7*  --   --  10.9*  --   --   --   HGB 8.6*  9.2*  --   --  7.8* 7.3* 8.9* 9.1*  HCT 26.5*  27.0*  --   --  25.2* 23.1* 28.0* 29.0*  MCV 89.5  --   --  89.0 90.6 88.1 88.4  PLT 69*   < > 68* 61* 66* 61* 66*   < > = values in this interval not displayed.   Basic Metabolic Panel: Recent Labs  Lab 10/17/19 1241 10/18/19 0013 10/19/19 0336 10/20/19 0618 10/21/19 0449  NA 133*  137 135 136 137 136  K 3.1*  3.1* 3.5 3.5 3.8 3.7  CL 111  108 115* 117* 113* 112*  CO2 13* 14* 14* 17* 17*  GLUCOSE 82  79 88 95 92 95  BUN 42*  38* 41* 37* 32* 31*  CREATININE 3.74*  4.10* 3.52* 3.41* 3.06* 2.81*  CALCIUM 7.4* 7.4* 7.3* 7.2* 7.2*  PHOS  --  5.1*  --   --   --    GFR: Estimated Creatinine Clearance: 23.2 mL/min (A) (by C-G formula based on SCr of 2.81 mg/dL (H)). Liver Function Tests: Recent Labs  Lab 10/17/19 1241 10/18/19 0013 10/19/19 0336 10/20/19 0618  AST 18  --  24 26  ALT 18  --  19 19  ALKPHOS 96  --  86 94  BILITOT 0.4  --  0.7 0.5  PROT 5.6*  --  4.6* 5.0*  ALBUMIN 2.0* 1.6* 1.6* 1.6*   No results for input(s): LIPASE, AMYLASE in the last 168 hours. No results for input(s): AMMONIA in the last 168 hours. Coagulation Profile: Recent Labs  Lab  10/17/19 1241 10/17/19 1311  INR 1.5* 1.6*   Cardiac Enzymes: No results for input(s): CKTOTAL, CKMB, CKMBINDEX, TROPONINI in the last 168 hours. BNP (last 3 results) No results for input(s): PROBNP in the last 8760 hours. HbA1C: No results for input(s): HGBA1C in the last 72 hours. CBG: Recent Labs  Lab 10/17/19 1212  GLUCAP 64*   Lipid Profile: No results for input(s): CHOL, HDL, LDLCALC, TRIG, CHOLHDL, LDLDIRECT in the last 72 hours. Thyroid Function Tests: No results for input(s): TSH, T4TOTAL, FREET4, T3FREE, THYROIDAB in the last 72 hours. Anemia Panel: Recent Labs    10/18/19 1913  RETICCTPCT 1.3   Urine analysis:    Component Value Date/Time   COLORURINE YELLOW 10/17/2019 1238  APPEARANCEUR CLOUDY (A) 10/17/2019 1238   LABSPEC 1.008 10/17/2019 1238   PHURINE 7.0 10/17/2019 1238   GLUCOSEU NEGATIVE 10/17/2019 1238   HGBUR LARGE (A) 10/17/2019 1238   BILIRUBINUR NEGATIVE 10/17/2019 1238   KETONESUR NEGATIVE 10/17/2019 1238   PROTEINUR 30 (A) 10/17/2019 1238   NITRITE NEGATIVE 10/17/2019 1238   LEUKOCYTESUR LARGE (A) 10/17/2019 1238   Sepsis Labs: @LABRCNTIP (procalcitonin:4,lacticidven:4)  ) Recent Results (from the past 240 hour(s))  SARS Coronavirus 2 by RT PCR (hospital order, performed in [facility] hospital lab) Nasopharyngeal Nasopharyngeal Swab     Status: None   Collection Time: 10/17/19  1:14 PM   Specimen: Nasopharyngeal Swab  Result Value Ref Range Status   SARS Coronavirus 2 NEGATIVE NEGATIVE Final    Comment: (NOTE) SARS-CoV-2 target nucleic acids are NOT DETECTED.  The SARS-CoV-2 RNA is generally detectable in upper and lower respiratory specimens during the acute phase of infection. The lowest concentration of SARS-CoV-2 viral copies this assay can detect is 250 copies / mL. A negative result does not preclude SARS-CoV-2 infection and should not be used as the sole basis for treatment or other patient management decisions.  A negative  result may occur with improper specimen collection / handling, submission of specimen other than nasopharyngeal swab, presence of viral mutation(s) within the areas targeted by this assay, and inadequate number of viral copies (<250 copies / mL). A negative result must be combined with clinical observations, patient history, and epidemiological information.  Fact Sheet for Patients:   StrictlyIdeas.no  Fact Sheet for Healthcare Providers: BankingDealers.co.za  This test is not yet approved or  cleared by the [place] FDA and has been authorized for detection and/or diagnosis of SARS-CoV-2 by FDA under an Emergency Use Authorization (EUA).  This EUA will remain in effect (meaning this test can be used) for the duration of the COVID-19 declaration under Section 564(b)(1) of the Act, 21 U.S.C. section 360bbb-3(b)(1), unless the authorization is terminated or revoked sooner.  Performed at [facility], [address] [place], [place] [address]   Blood culture (routine x 2)     Status: None (Preliminary result)   Collection Time: 10/17/19  2:26 PM   Specimen: Left Antecubital; Blood  Result Value Ref Range Status   Specimen Description   Final    LEFT ANTECUBITAL BOTTLES DRAWN AEROBIC AND ANAEROBIC   Special Requests Blood Culture adequate volume  Final   Culture   Final    NO GROWTH 4 DAYS Performed at [facility], [address], [place], [place] [address]    Report Status PENDING  Incomplete  Urine culture     Status: Abnormal   Collection Time: 10/17/19  2:37 PM   Specimen: Urine, Catheterized  Result Value Ref Range Status   Specimen Description   Final    URINE, CATHETERIZED Performed at [facility], [address], [place], [place] [address]    Special Requests   Final    NONE Performed at [facility], [address], [place],  [address]    Culture MULTIPLE SPECIES PRESENT, SUGGEST RECOLLECTION (A)  Final    Report Status 10/19/2019 FINAL  Final  Blood culture (routine x 2)     Status: None (Preliminary result)   Collection Time: 10/17/19  2:41 PM   Specimen: Right Antecubital; Blood  Result Value Ref Range Status   Specimen Description   Final    RIGHT ANTECUBITAL BOTTLES DRAWN AEROBIC AND ANAEROBIC   Special Requests Blood Culture adequate volume  Final   Culture   Final    NO GROWTH 4 DAYS Performed at [facility], [address] [place], [place] [address]    Report Status PENDING  Incomplete      Radiology Studies: DG Chest 1 View  Result Date: 10/20/2019 CLINICAL DATA:  Elevated D-dimer. History of chemical exposure, GI bleeding. EXAM: CHEST  1 VIEW COMPARISON:  10/17/2019 FINDINGS: Shallow lung inflation. Heart size is normal. There are no focal consolidations or pleural effusions. IMPRESSION: Shallow inflation.  No evidence for acute  abnormality. Electronically Signed   By: Nolon Nations M.D.   On: 10/20/2019 18:44   NM Pulmonary Perfusion  Result Date: 10/20/2019 CLINICAL DATA:  54 year old male with concern for pulmonary embolism. Positive D-dimer. EXAM: NUCLEAR MEDICINE PERFUSION LUNG SCAN TECHNIQUE: Perfusion images were obtained in multiple projections after intravenous injection of radiopharmaceutical. Ventilation scans intentionally deferred if perfusion scan and chest x-ray adequate for interpretation during COVID 19 epidemic. RADIOPHARMACEUTICALS:  4.2 mCi Tc-56m MAA IV COMPARISON:  Chest radiograph dated 10/20/2019. FINDINGS: There is homogeneous perfusion of the lungs. No focal defect identified. IMPRESSION: Normal perfusion scan. Electronically Signed   By: Anner Crete M.D.   On: 10/20/2019 18:41   ECHOCARDIOGRAM COMPLETE  Result Date: 10/19/2019    ECHOCARDIOGRAM REPORT   Patient Name:   Chase [name] Date of Exam: 10/19/2019 Medical Rec #:  [id number]          Height:       68.0 in Accession #:    [id number]         Weight:       122.0 lb Date of Birth:   Aug 17, 1965          BSA:          1.657 m Patient Age:    5 years           BP:           117/78 mmHg Patient Gender: M                  HR:           64 bpm. Exam Location:  Inpatient Procedure: 2D Echo, Cardiac Doppler and Color Doppler Indications:    Stroke  History:        Patient has no prior history of Echocardiogram examinations.                 Stroke. Elevated troponin.  Sonographer:    Roseanna Rainbow RDCS Referring Phys: [id number] RIPUDEEP K RAI  Sonographer Comments: Technically difficult study due to poor echo windows and no subcostal window. Difficult due to very thin habitus. Very low parasternal. No true subcostal view and could not visualize IVC. IMPRESSIONS  1. There is a mass like structure noted in the right pulmonary artery. This is seen on the suprasternal notch views. Unable to exclude pulmonary embolism. Given elevated troponin, would consider CT PE study to exclude PE.  2. Left ventricular ejection fraction, by estimation, is 60 to 65%. The left ventricle has normal function. The left ventricle has no regional wall motion abnormalities. Left ventricular diastolic parameters are consistent with Grade I diastolic dysfunction (impaired relaxation).  3. Right ventricular systolic function is normal. The right ventricular size is normal. Tricuspid regurgitation signal is inadequate for assessing PA pressure.  4. There is a moderate pericardial effusion up to 1.5 cm over the anterior RV and LV apex. There is no RA/RV diastolic collapse. There is no  respiratory variation in MV inflow. The IVC is now well visualized. There are no overt echocardiographic signs of tamponade. Moderate pericardial effusion. The pericardial effusion is anterior to the right ventricle and surrounding the apex. There is no evidence of cardiac tamponade.  5. The mitral valve is grossly normal. Trivial mitral valve regurgitation. No evidence of mitral stenosis.  6. The aortic valve is tricuspid. Aortic valve regurgitation is not  visualized. No aortic stenosis is present. FINDINGS  Left Ventricle: Left ventricular ejection fraction, by estimation, is 60 to 65%. The left ventricle has normal function. The left ventricle has no regional wall motion abnormalities. The left ventricular internal cavity size was normal in size. There is  no left ventricular hypertrophy. Left ventricular diastolic parameters are consistent with Grade I diastolic dysfunction (impaired relaxation). Right Ventricle: The right ventricular size is normal. No increase in right ventricular wall thickness. Right ventricular systolic function is normal. Tricuspid regurgitation signal is inadequate for assessing PA pressure. Left Atrium: Left atrial size was normal in size. Right Atrium: Right atrial size was normal in size. Pericardium: There is a moderate pericardial effusion up to 1.5 cm over the anterior RV and LV apex. There is no RA/RV diastolic collapse. There is no respiratory variation in MV inflow. The IVC is now well visualized. There are no overt echocardiographic signs of tamponade. A moderately sized pericardial effusion is present. The pericardial effusion is anterior to the right ventricle and surrounding the apex. The pericardial effusion appears to contain fibrous material. There is no evidence of cardiac tamponade. Presence of pericardial fat pad. Mitral Valve: The mitral valve is grossly normal. Trivial mitral valve regurgitation. No evidence of mitral valve stenosis. Tricuspid Valve: The tricuspid valve is grossly normal. Tricuspid valve regurgitation is trivial. No evidence of tricuspid stenosis. Aortic Valve: The aortic valve is tricuspid. Aortic valve regurgitation is not visualized. No aortic stenosis is present. Pulmonic Valve: The pulmonic valve was grossly normal. Pulmonic valve regurgitation is not visualized. No evidence of pulmonic stenosis. Aorta: The aortic root is normal in size and structure. Venous: The inferior vena cava was not well  visualized. IAS/Shunts: The atrial septum is grossly normal.  LEFT VENTRICLE PLAX 2D LVIDd:         4.20 cm      Diastology LVIDs:         2.80 cm      LV e' lateral:   11.10 cm/s LV PW:         1.20 cm      LV E/e' lateral: 4.2 LV IVS:        1.10 cm      LV e' medial:    6.64 cm/s LVOT diam:     2.00 cm      LV E/e' medial:  7.0 LV SV:         41 LV SV Index:   25 LVOT Area:     3.14 cm  LV Volumes (MOD) LV vol d, MOD A2C: 116.0 ml LV vol d, MOD A4C: 93.8 ml LV vol s, MOD A2C: 43.3 ml LV vol s, MOD A4C: 39.7 ml LV SV MOD A2C:     72.7 ml LV SV MOD A4C:     93.8 ml LV SV MOD BP:      65.0 ml RIGHT VENTRICLE RV S prime:     18.10 cm/s TAPSE (M-mode): 2.8 cm LEFT ATRIUM           Index  RIGHT ATRIUM           Index LA diam:      3.80 cm 2.29 cm/m  RA Area:     12.00 cm LA Vol (A2C): 32.7 ml 19.74 ml/m RA Volume:   24.80 ml  14.97 ml/m LA Vol (A4C): 24.5 ml 14.79 ml/m  AORTIC VALVE LVOT Vmax:   60.10 cm/s LVOT Vmean:  36.000 cm/s LVOT VTI:    0.132 m  AORTA Ao Root diam: 4.10 cm MITRAL VALVE MV Area (PHT): 2.21 cm    SHUNTS MV Decel Time: 343 msec    Systemic VTI:  0.13 m MV E velocity: 46.20 cm/s  Systemic Diam: 2.00 cm MV A velocity: 45.10 cm/s MV E/A ratio:  1.02 Eleonore Chiquito MD Electronically signed by Eleonore Chiquito MD Signature Date/Time: 10/19/2019/5:35:14 PM    Final      Scheduled Meds: . aspirin EC  81 mg Oral Daily  . Chlorhexidine Gluconate Cloth  6 each Topical Daily  . DULoxetine  30 mg Oral QHS  . DULoxetine  60 mg Oral Daily  . gabapentin  300 mg Oral BID  . midodrine  2.5 mg Oral TID  . pantoprazole  40 mg Oral BID AC  . pneumococcal 23 valent vaccine  0.5 mL Intramuscular Tomorrow-1000  . sodium bicarbonate  1,300 mg Oral TID  . tamsulosin  0.4 mg Oral Daily   Continuous Infusions: . cefTRIAXone (ROCEPHIN)  IV Stopped (10/20/19 1301)  . doxycycline (VIBRAMYCIN) IV 100 mg (10/21/19 0139)     LOS: 4 days   Time Spent in minutes   45 minutes  Chloris Marcoux D.O. on  10/21/2019 at 12:59 PM  Between 7am to 7pm - Please see pager noted on [email or web address]  After 7pm go to [email or web address]  And look for the night coverage person covering for me after hours  [facility] Office  [phone]

## 2019-10-21 NOTE — Consult Note (Signed)
[place] Nurse Consult Note: Reason for Consult:Stage 3 coccyx wound.  Present on admission.  Prominent trochanter and scapula bony protrusions. Protected with foam dressing.  Abrasion to right anterior lower leg from fall.  Protected with foam dressing.  Wound type:pressure and fall trauma Pressure Injury POA: Yes Measurement: Coccyx 1 cm x 1 cm x 0.2 cm  Right anterior lower leg. 1 cm round abrasion, partial thickness Wound MGQ:QPYP and moist Drainage (amount, consistency, odor) minimal serosanguinous Periwound:scattered petechiae Dressing procedure/placement/frequency: Continue silicone foam and ofloading.  Will order low air loss mattress.  Will not follow at this time.  Please re-consult if needed.  Domenic Moras MSN, RN, FNP-BC CWON Wound, Ostomy, Continence Nurse Pager [phone]

## 2019-10-21 NOTE — Progress Notes (Signed)
Occupational Therapy Treatment Patient Details Name: Chase [name] MRN: [id number] DOB: [birth year] Today's Date: 10/21/2019    History of present illness Pt is a 54 y.o. male with PMH of cervical post-laminectomy syndrome, arthalgia of multiple sites, hodgkin's disease, PTSD, CKD, chronic pain syndrome, and anemia presenting to ED with L sided weakness, slurred speech, and fall. 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   OT comments  Pt making steady progress towards OT goals this session. Pt continues to present with decreased activity tolerance, L sided weakness, pain and impaired balance impacting pts ability to complete BADLs. Pt able to stand to stedy x4 from EOB and recliner with MIN A +2. Pt continues to require assist maneuvering LUE onto stedy and at least MIN A for sitting balance EOB. Noted more active movement in L shoulder with pt continuing to present with LUE [facility] deficits. DC plan remains appropriate, will follow acutely per POC.    Follow Up Recommendations  CIR    Equipment Recommendations  Other (comment) (TBD at next venue of care)    Recommendations for Other Services      Precautions / Restrictions Precautions Precautions: Fall Precaution Comments: Stage 3 coccyx wound per WOC note Restrictions Weight Bearing Restrictions: No       Mobility Bed Mobility Overal bed mobility: Needs Assistance Bed Mobility: Supine to Sit     Supine to sit: Max assist;+2 for physical assistance;HOB elevated     General bed mobility comments: pt required assist to advance BLEs to EOB. pt was able to progress RLE but requried assist for LLE. pt able to push into bed with RUE with HOB elevated but ultimately requried MOD A +2 to elevate trunk completely  Transfers Overall transfer level: Needs assistance Equipment used: Ambulation equipment used Transfers: Sit to/from Stand Sit to Stand: Min assist;+2 physical assistance         General  transfer comment: pt requried cues for hand placement and requried physical assist to position LUE on stedy. MIN A +2 to power into standing and to facilitate bringing hips into extension    Balance Overall balance assessment: Needs assistance Sitting-balance support: Bilateral upper extremity supported;Feet supported Sitting balance-Leahy Scale: Poor Sitting balance - Comments: at least MIN A for upright posture however noted improvement from previous session, pt requried cues to maintain upright posture while seated on stedy Postural control: Right lateral lean Standing balance support: Bilateral upper extremity supported;During functional activity Standing balance-Leahy Scale: Poor Standing balance comment: reliant on UE support and external assist                           ADL either performed or assessed with clinical judgement   ADL Overall ADL's : Needs assistance/impaired                 Upper Body Dressing : Moderate assistance;Sitting Upper Body Dressing Details (indicate cue type and reason): to don new hospital gown, MOD A to maneuver LUE into sleeve of gown     Toilet Transfer: Minimal assistance;+2 for physical assistance (stand to stedy only) Toilet Transfer Details (indicate cue type and reason): simulated with stedy from recliner>EOB         Functional mobility during ADLs: Minimal assistance;+2 for physical assistance (to stand to stedy) General ADL Comments: pt continues to c/o of pain from sacral wound, pt did get air mattress today.  noted improvement in sitting balance this session  Vision Baseline Vision/History: No visual deficits Vision Assessment?: Vision impaired- to be further tested in functional context Additional Comments: pt keeps eyes closed most of session   Perception     Praxis      Cognition Arousal/Alertness: Lethargic (initially lethargiv but aroused more as session progressed) Behavior During Therapy: Flat  affect Overall Cognitive Status: Impaired/Different from baseline Area of Impairment: Problem solving;Awareness;Following commands;Attention                   Current Attention Level: Selective   Following Commands: Follows one step commands consistently;Follows one step commands with increased time   Awareness: Emergent Problem Solving: Slow processing;Decreased initiation;Difficulty sequencing;Requires verbal cues;Requires tactile cues General Comments: very flat and slow to process. needing cues to open eyes and attend to session        Exercises     Shoulder Instructions       General Comments Stage 3 coccyx wound    Pertinent Vitals/ Pain       Pain Assessment: Faces Faces Pain Scale: Hurts whole lot Pain Location: buttock with bed mobility Pain Descriptors / Indicators: Discomfort;Grimacing;Guarding;Tender Pain Intervention(s): Limited activity within patient's tolerance;Monitored during session;Repositioned  Home Living                                          Prior Functioning/Environment              Frequency  Min 2X/week        Progress Toward Goals  OT Goals(current goals can now be found in the care plan section)  Progress towards OT goals: Progressing toward goals  Acute Rehab OT Goals Patient Stated Goal: Not have to depend on anyone  OT Goal Formulation: With patient Time For Goal Achievement: 11/01/19 Potential to Achieve Goals: Good  Plan Discharge plan remains appropriate;Frequency remains appropriate    Co-evaluation                 AM-PAC OT "6 Clicks" Daily Activity     Outcome Measure   Help from another person eating meals?: A Little (cutting items and open packets) Help from another person taking care of personal grooming?: A Lot Help from another person toileting, which includes using toliet, bedpan, or urinal?: Total Help from another person bathing (including washing, rinsing, drying)?:  Total Help from another person to put on and taking off regular upper body clothing?: A Lot Help from another person to put on and taking off regular lower body clothing?: Total 6 Click Score: 10    End of Session Equipment Utilized During Treatment: Other (comment) (stedy)  OT Visit Diagnosis: Unsteadiness on feet (R26.81);Other abnormalities of gait and mobility (R26.89);Muscle weakness (generalized) (M62.81);Repeated falls (R29.6);History of falling (Z91.81);Low vision, both eyes (H54.2);Other symptoms and signs involving the nervous system (R29.898);Hemiplegia and hemiparesis Hemiplegia - Right/Left: Left Hemiplegia - dominant/non-dominant: Non-Dominant Hemiplegia - caused by: Cerebral infarction   Activity Tolerance Patient tolerated treatment well   Patient Left in bed;with call bell/phone within reach   Nurse Communication Mobility status;Other (comment) (switched pt into air mattress)        Time: 5093-2671 OT Time Calculation (min): 35 min  Charges: OT General Charges $OT Visit: 1 Visit OT Treatments $Self Care/Home Management : 8-22 mins $Therapeutic Activity: 8-22 mins  Lanier Clam., COTA/L Acute Rehabilitation Services [phone]    Ihor Gully 10/21/2019, 11:37 AM

## 2019-10-21 NOTE — Progress Notes (Signed)
°  Speech Language Pathology Treatment: Cognitive-Linquistic  Patient Details Name: Chase [name] MRN: [id number] DOB: 04-18-1965 Today's Date: 10/21/2019 Time: 1000-1025 SLP Time Calculation (min) (ACUTE ONLY): 25 min  Assessment / Plan / Recommendation Clinical Impression  Chase [name] was upright in bed complaining of food tasting poorly. He reported, "you want me to gain weight, don't you?" He refused to eat anything until he was provided salt. SLP discussed with RN, who went to get him salt. He reported changes in cognition, including memory difficulty, attention, and problem solving. He described this as, "I can remember the big things that happened yesterday, but not the other stuff. Sometimes I sit and have something in front of me and don't know what to do with it." He was provided a handout of memory strategies including "WRAPS: Write it down, Repeat it, Associate it, Picture it, Schedule it." Pt and SLP reviewed all strategies together and provided examples. He was able to utilize strategies i'ly with success for delayed recall in 4/6 trials. He perseverated on sacral pain. He also reported feeling that no one was telling him the truth that he is paralyzed on one side. Pt was educated on hospitalization and MRI findings. He stated, "I guess I do remember hearing that before." Pt requested ceasing treatment d/t wanting to eat his eggs. Pt with reduced motivation, but good potential for progress if participatory. Good candidate for IPR setting.   HPI HPI:  54 y.o. male presented to [facility] ED 10/17/19 with c/o left sided weakness and sensory loss. MRI 7/19: subcentimeter acute infarcts in the cerebellum bilaterally. There is a small acute cortical infarct in the posteromedial right occipital lobe. There are multiple subcentimeter acute infarcts in the right corpus callosum, right cingulate gyrus, and right greater than left frontal lobes. PMHx chemical exposure, PTSD, renal disorder. GERD, chronic  pain syndrome.      SLP Plan  Continue with current plan of care       Recommendations            Follow up Recommendations: Inpatient Rehab SLP Visit Diagnosis: Cognitive communication deficit (R41.841) Plan: Continue with current plan of care                    Chase [name] P. Elajah Kunsman, M.S., CCC-SLP Speech-Language Pathologist Acute Rehabilitation Services Pager: [place] 10/21/2019, 10:35 AM

## 2019-10-21 NOTE — Progress Notes (Signed)
[facility] Progress Note    Assessment/ Plan:   54 year old male past medical history of BPH, multiple episodes of UTI and obstruction, Hodgkin's lymphoma, CKD stage IV, coronary artery disease, gastric bypass 1996, cervical radiculopathy, PTSD, chronic pain syndrome who presented with weakness found to have stroke.  Consulted for thrombocytopenia with concerns for TTP. 1. CKD4 underlying etiology likely related to multiple episodes of AKI secondary to BPH/obstruction -At present his renal function is currently at baseline and stable (slightly better today with a creatinine down to 2.8).  Seems that his baseline creatinine is largely fluctuant between 3 and 4.5 -if he does need any contrast studies especially with this questionable PE found on echo then would recommend pre and post hydration with isotonic fluids (given his overall volume status on my exam today, he should be able to tolerate it). -Avoid nephrotoxic medications including NSAIDs and iodinated intravenous contrast exposure unless the latter is absolutely indicated.  Preferred narcotic agents for pain control are hydromorphone, fentanyl, and methadone. Morphine should not be used. Avoid Baclofen and avoid oral sodium phosphate and magnesium citrate based laxatives / bowel preps. Continue strict Input and Output monitoring. Will monitor the patient closely with you and intervene or adjust therapy as indicated by changes in clinical status/labs  2.  Thrombocytopenia, less likely to be TTP more possibly related to underlying infection/tickborne illness -Renal function stable and at baseline (slightly better today).  Platelet count today stable and relatively unchanged.  Peripheral smear was reviewed by hematology on 7/19 which revealed no significant schistocytes.  LDH and haptoglobin were also normal.  ADAMTS13 33.9 (low but not <10). Shiga toxin pending.  No indication for plasmapheresis at this junction which I discussed  with hematology at the day of consultation 3.  Metabolic acidosis secondary to chronic kidney disease along with history of gastric bypass (stable/improved).  Continue with sodium bicarbonate tablets to 1300 mg 3 times daily.  Can uptitrate this further to 1950mg  if needed 4. H/o recurrent urinary obstruction.  Maintain Foley (placed at [facility] back in May). 5.  Bladder diverticulum.  Can follow-up with urology as an outpatient 6. Anemia of chronic kidney disease and b12 deficiency. Check iron panel. s/p 1u prbc 7/21  Subjective:   No acute events overnight, no new complaints   Objective:   BP 117/85 (BP Location: Left Arm)   Pulse 65   Temp 98.7 F (37.1 C) (Oral)   Resp 20   Ht 5\' 8"  (1.727 m)   Wt 55.3 kg   SpO2 100%   BMI 18.55 kg/m   Intake/Output Summary (Last 24 hours) at 10/21/2019 1400 Last data filed at 10/21/2019 7619 Gross per 24 hour  Intake 1106.05 ml  Output 2960 ml  Net -1853.95 ml   Weight change:   Physical Exam: Gen:NAD, chronically ill appearing HEENT: temporal wasting  CVS:s1s2, rrr Resp:cta bl, no w/r/r/c Abd: soft, nt/nd Skin: petechiae bl le's (unchanged) Ext:no edema Neuro: Speech clear and coherent, left-sided weakness  Imaging: DG Chest 1 View  Result Date: 10/20/2019 CLINICAL DATA:  Elevated D-dimer. History of chemical exposure, GI bleeding. EXAM: CHEST  1 VIEW COMPARISON:  10/17/2019 FINDINGS: Shallow lung inflation. Heart size is normal. There are no focal consolidations or pleural effusions. IMPRESSION: Shallow inflation.  No evidence for acute  abnormality. Electronically Signed   By: Nolon Nations M.D.   On: 10/20/2019 18:44   NM Pulmonary Perfusion  Result Date: 10/20/2019 CLINICAL DATA:  54 year old male with concern for pulmonary embolism.  Positive D-dimer. EXAM: NUCLEAR MEDICINE PERFUSION LUNG SCAN TECHNIQUE: Perfusion images were obtained in multiple projections after intravenous injection of radiopharmaceutical. Ventilation  scans intentionally deferred if perfusion scan and chest x-ray adequate for interpretation during COVID 19 epidemic. RADIOPHARMACEUTICALS:  4.2 mCi Tc-97m MAA IV COMPARISON:  Chest radiograph dated 10/20/2019. FINDINGS: There is homogeneous perfusion of the lungs. No focal defect identified. IMPRESSION: Normal perfusion scan. Electronically Signed   By: Anner Crete M.D.   On: 10/20/2019 18:41    Labs: BMET Recent Labs  Lab 10/17/19 1241 10/18/19 0013 10/19/19 0336 10/20/19 0618 10/21/19 0449  NA 133*  137 135 136 137 136  K 3.1*  3.1* 3.5 3.5 3.8 3.7  CL 111  108 115* 117* 113* 112*  CO2 13* 14* 14* 17* 17*  GLUCOSE 82  79 88 95 92 95  BUN 42*  38* 41* 37* 32* 31*  CREATININE 3.74*  4.10* 3.52* 3.41* 3.06* 2.81*  CALCIUM 7.4* 7.4* 7.3* 7.2* 7.2*  PHOS  --  5.1*  --   --   --    CBC Recent Labs  Lab 10/17/19 1241 10/17/19 1311 10/18/19 0013 10/19/19 0336 10/20/19 0618 10/21/19 0449  WBC 18.8*   < > 11.7* 7.8 6.2 6.0  NEUTROABS 17.7*  --  10.9*  --   --   --   HGB 8.6*  9.2*   < > 7.8* 7.3* 8.9* 9.1*  HCT 26.5*  27.0*   < > 25.2* 23.1* 28.0* 29.0*  MCV 89.5   < > 89.0 90.6 88.1 88.4  PLT 69*   < > 61* 66* 61* 66*   < > = values in this interval not displayed.    Medications:    . aspirin EC  81 mg Oral Daily  . Chlorhexidine Gluconate Cloth  6 each Topical Daily  . DULoxetine  30 mg Oral QHS  . DULoxetine  60 mg Oral Daily  . gabapentin  300 mg Oral BID  . midodrine  2.5 mg Oral TID  . pantoprazole  40 mg Oral BID AC  . pneumococcal 23 valent vaccine  0.5 mL Intramuscular Tomorrow-1000  . sodium bicarbonate  1,300 mg Oral TID  . tamsulosin  0.4 mg Oral Daily    Gean Quint, MD [facility] 10/21/2019, 2:00 PM

## 2019-10-21 NOTE — NC FL2 (Signed)
[facility] LEVEL OF CARE SCREENING TOOL     IDENTIFICATION  Patient Name: Chase [name] Birthdate: Aug 21, 1965 Sex: male Admission Date (Current Location): 10/17/2019  [facility] and [place] Number:  Herbalist and Address:  [facility], [place] [address], [place], [place] [address]      Provider Number: [id number]  Attending Physician Name and Address:  Cristal Ford, DO  Relative Name and Phone Number:       Current Level of Care: Hospital Recommended Level of Care: [place] Prior Approval Number:    Date Approved/Denied:   PASRR Number: [id number]  Discharge Plan: SNF    Current Diagnoses: Patient Active Problem List   Diagnosis Date Noted  . Cerebral embolism with cerebral infarction 10/18/2019  . Acute left-sided weakness 10/17/2019  . CKD (chronic kidney disease), stage IV ([place]) 10/17/2019  . Chronic pain 10/17/2019  . Fall 10/17/2019  . Acute lower UTI 10/17/2019    Orientation RESPIRATION BLADDER Height & Weight     Self, Time, Situation, Place  Normal Indwelling catheter Weight: 122 lb (55.3 kg) Height:  5\' 8"  (172.7 cm)  BEHAVIORAL SYMPTOMS/MOOD NEUROLOGICAL BOWEL NUTRITION STATUS      Continent Diet (see DC summary)  AMBULATORY STATUS COMMUNICATION OF NEEDS Skin   Extensive Assist Verbally PU Stage and Appropriate Care     PU Stage 3 Dressing:  (coccyx, foam dressing, air mattress, change as needed)                 Personal Care Assistance Level of Assistance  Bathing, Feeding, Dressing Bathing Assistance: Maximum assistance Feeding assistance: Limited assistance Dressing Assistance: Maximum assistance     Functional Limitations Info             SPECIAL CARE FACTORS FREQUENCY  PT (By licensed PT), OT (By licensed OT)     PT Frequency: 5x/wk OT Frequency: 5x/wk            Contractures Contractures Info: Not present    Additional Factors Info  Code Status,  Allergies, Isolation Precautions Code Status Info: Full Allergies Info: Nsaids     Isolation Precautions Info: Enteric precautions     Current Medications (10/21/2019):  This is the current hospital active medication list Current Facility-Administered Medications  Medication Dose Route Frequency Provider Last Rate Last Admin  . acetaminophen (TYLENOL) tablet 650 mg  650 mg Oral Q4H PRN Rai, Ripudeep K, MD       Or  . acetaminophen (TYLENOL) 160 MG/5ML solution 650 mg  650 mg Per Tube Q4H PRN Rai, Ripudeep K, MD       Or  . acetaminophen (TYLENOL) suppository 650 mg  650 mg Rectal Q4H PRN Rai, Ripudeep K, MD      . aspirin EC tablet 81 mg  81 mg Oral Daily Garvin Fila, MD   81 mg at 10/20/19 1043  . cefTRIAXone (ROCEPHIN) 2 g in sodium chloride 0.9 % 100 mL IVPB  2 g Intravenous Q24H Edwin Dada, MD   Stopped at 10/20/19 1301  . Chlorhexidine Gluconate Cloth 2 % PADS 6 each  6 each Topical Daily Rai, Ripudeep K, MD   6 each at 10/20/19 1032  . doxycycline (VIBRAMYCIN) 100 mg in sodium chloride 0.9 % 250 mL IVPB  100 mg Intravenous Q12H Edwin Dada, MD 125 mL/hr at 10/21/19 0139 100 mg at 10/21/19 0139  . DULoxetine (CYMBALTA) DR capsule 30 mg  30 mg Oral  QHS Rai, Ripudeep K, MD   30 mg at 10/20/19 2152  . DULoxetine (CYMBALTA) DR capsule 60 mg  60 mg Oral Daily Rai, Ripudeep K, MD   60 mg at 10/20/19 1043  . gabapentin (NEURONTIN) capsule 300 mg  300 mg Oral BID Vernelle Emerald, MD   300 mg at 10/20/19 2153  . HYDROcodone-acetaminophen (NORCO/VICODIN) 5-325 MG per tablet 1 tablet  1 tablet Oral Q4H PRN Edwin Dada, MD   1 tablet at 10/21/19 4136  . hydrOXYzine (ATARAX/VISTARIL) tablet 10 mg  10 mg Oral TID PRN Rai, Ripudeep K, MD      . midodrine (PROAMATINE) tablet 2.5 mg  2.5 mg Oral TID Rai, Ripudeep K, MD   2.5 mg at 10/21/19 0744  . pantoprazole (PROTONIX) EC tablet 40 mg  40 mg Oral BID AC Rai, Ripudeep K, MD   40 mg at 10/21/19 0744  .  pneumococcal 23 valent vaccine (PNEUMOVAX-23) injection 0.5 mL  0.5 mL Intramuscular Tomorrow-1000 Rai, Ripudeep K, MD      . sodium bicarbonate tablet 1,300 mg  1,300 mg Oral TID Gean Quint, MD   1,300 mg at 10/20/19 2151  . tamsulosin (FLOMAX) capsule 0.4 mg  0.4 mg Oral Daily Rai, Ripudeep K, MD   0.4 mg at 10/20/19 1043     Discharge Medications: Please see discharge summary for a list of discharge medications.  Relevant Imaging Results:  Relevant Lab Results:   Additional Information SS#: [id number]  Geralynn Ochs, LCSW

## 2019-10-22 ENCOUNTER — Inpatient Hospital Stay (HOSPITAL_COMMUNITY): Payer: No Typology Code available for payment source

## 2019-10-22 DIAGNOSIS — N390 Urinary tract infection, site not specified: Secondary | ICD-10-CM | POA: Diagnosis not present

## 2019-10-22 DIAGNOSIS — W19XXXA Unspecified fall, initial encounter: Secondary | ICD-10-CM | POA: Diagnosis not present

## 2019-10-22 DIAGNOSIS — G8921 Chronic pain due to trauma: Secondary | ICD-10-CM | POA: Diagnosis not present

## 2019-10-22 DIAGNOSIS — N184 Chronic kidney disease, stage 4 (severe): Secondary | ICD-10-CM | POA: Diagnosis not present

## 2019-10-22 LAB — CBC
HCT: 28.9 % — ABNORMAL LOW (ref 39.0–52.0)
Hemoglobin: 8.9 g/dL — ABNORMAL LOW (ref 13.0–17.0)
MCH: 27.8 pg (ref 26.0–34.0)
MCHC: 30.8 g/dL (ref 30.0–36.0)
MCV: 90.3 fL (ref 80.0–100.0)
Platelets: 76 10*3/uL — ABNORMAL LOW (ref 150–400)
RBC: 3.2 MIL/uL — ABNORMAL LOW (ref 4.22–5.81)
RDW: 18.3 % — ABNORMAL HIGH (ref 11.5–15.5)
WBC: 6.9 10*3/uL (ref 4.0–10.5)
nRBC: 0 % (ref 0.0–0.2)

## 2019-10-22 LAB — CULTURE, BLOOD (ROUTINE X 2)
Culture: NO GROWTH
Culture: NO GROWTH
Special Requests: ADEQUATE
Special Requests: ADEQUATE

## 2019-10-22 LAB — BASIC METABOLIC PANEL
Anion gap: 8 (ref 5–15)
BUN: 29 mg/dL — ABNORMAL HIGH (ref 6–20)
CO2: 17 mmol/L — ABNORMAL LOW (ref 22–32)
Calcium: 7.3 mg/dL — ABNORMAL LOW (ref 8.9–10.3)
Chloride: 112 mmol/L — ABNORMAL HIGH (ref 98–111)
Creatinine, Ser: 2.52 mg/dL — ABNORMAL HIGH (ref 0.61–1.24)
GFR calc Af Amer: 32 mL/min — ABNORMAL LOW (ref >60)
GFR calc non Af Amer: 28 mL/min — ABNORMAL LOW (ref >60)
Glucose, Bld: 173 mg/dL — ABNORMAL HIGH (ref 70–99)
Potassium: 3.6 mmol/L (ref 3.5–5.1)
Sodium: 137 mmol/L (ref 135–145)

## 2019-10-22 MED ORDER — DOXYCYCLINE HYCLATE 100 MG PO TABS
100.0000 mg | ORAL_TABLET | Freq: Two times a day (BID) | ORAL | Status: DC
  Administered 2019-10-22 – 2019-10-26 (×8): 100 mg via ORAL
  Filled 2019-10-22 (×8): qty 1

## 2019-10-22 MED ORDER — IOHEXOL 350 MG/ML SOLN
65.0000 mL | Freq: Once | INTRAVENOUS | Status: AC | PRN
  Administered 2019-10-22: 65 mL via INTRAVENOUS

## 2019-10-22 MED ORDER — ADULT MULTIVITAMIN W/MINERALS CH
1.0000 | ORAL_TABLET | Freq: Every day | ORAL | Status: DC
  Administered 2019-10-22 – 2019-10-26 (×5): 1 via ORAL
  Filled 2019-10-22 (×5): qty 1

## 2019-10-22 MED ORDER — ENSURE ENLIVE PO LIQD: 237.0000 mL | Freq: Three times a day (TID) | ORAL | Status: DC

## 2019-10-22 MED ORDER — SODIUM BICARBONATE 650 MG PO TABS
1950.0000 mg | ORAL_TABLET | Freq: Three times a day (TID) | ORAL | Status: DC
  Administered 2019-10-22 – 2019-10-26 (×12): 1950 mg via ORAL
  Filled 2019-10-22 (×12): qty 3

## 2019-10-22 NOTE — Progress Notes (Signed)
[facility] Progress Note    Assessment/ Plan:   54 year old male past medical history of BPH, multiple episodes of UTI and obstruction, Hodgkin's lymphoma, CKD stage IV, coronary artery disease, gastric bypass 1996, cervical radiculopathy, PTSD, chronic pain syndrome who presented with weakness found to have stroke.  Consulted for thrombocytopenia with concerns for TTP. 1. CKD4 underlying etiology likely related to multiple episodes of AKI secondary to BPH/obstruction -At present his renal function is currently at baseline and stable (slightly better today with a creatinine down to 2.5).  Seems that his baseline creatinine is largely fluctuant between 3 and 4.5 -Post contrast hydration -Overall looking at trajectory of creatinine.  His overall EGFR is hard to estimate given his lack of muscle mass -Avoid nephrotoxic medications including NSAIDs and iodinated intravenous contrast exposure unless the latter is absolutely indicated.  Preferred narcotic agents for pain control are hydromorphone, fentanyl, and methadone. Morphine should not be used. Avoid Baclofen and avoid oral sodium phosphate and magnesium citrate based laxatives / bowel preps. Continue strict Input and Output monitoring. Will monitor the patient closely with you and intervene or adjust therapy as indicated by changes in clinical status/labs  2.  Thrombocytopenia, less likely to be TTP more possibly related to underlying infection/tickborne illness -Renal function stable/improved.  Platelet count today slightly better.  Peripheral smear was reviewed by hematology on 7/19 which revealed no significant schistocytes.  LDH and haptoglobin were also normal.  ADAMTS13 33.9 (low but not <10). Shiga toxin pending.  No indication for plasmapheresis at this junction which I discussed with hematology at the day of consultation. 3.  Metabolic acidosis secondary to chronic kidney disease along with history of gastric  bypass -Increased sodium bicarbonate to 1950 mg 3 times daily 4. H/o recurrent urinary obstruction.  Maintain Foley (placed at [facility] back in May). 5.  Bladder diverticulum.  Can follow-up with urology as an outpatient 6. Anemia of chronic kidney disease and b12 deficiency. Check iron panel. s/p 1u prbc 7/21  Subjective:   No acute events overnight, no new complaints. Did cta last night (no PE), receiving fluids. Denies swelling/SOB. Wants his flomax.   Objective:   BP 126/81 (BP Location: Right Arm)   Pulse 70   Temp 98 F (36.7 C) (Oral)   Resp 14   Ht _0  (1.727 m)   Wt 55.3 kg   SpO2 99%   BMI 18.55 kg/m   Intake/Output Summary (Last 24 hours) at 10/22/2019 1309 Last data filed at 10/22/2019 1148 Gross per 24 hour  Intake 315.58 ml  Output 2950 ml  Net -2634.42 ml   Weight change:   Physical Exam: Gen:NAD, chronically ill appearing HEENT: temporal wasting  CVS:s1s2, rrr Resp:cta bl, no w/r/r/c Abd: soft, nt/nd Skin: petechiae bl le's (unchanged) Ext:no edema Neuro: Speech clear and coherent, left-sided weakness  Imaging: DG Chest 1 View  Result Date: 10/20/2019 CLINICAL DATA:  Elevated D-dimer. History of chemical exposure, GI bleeding. EXAM: CHEST  1 VIEW COMPARISON:  10/17/2019 FINDINGS: Shallow lung inflation. Heart size is normal. There are no focal consolidations or pleural effusions. IMPRESSION: Shallow inflation.  No evidence for acute  abnormality. Electronically Signed   By: Nolon Nations M.D.   On: 10/20/2019 18:44   CT ANGIO CHEST PE W OR WO CONTRAST  Result Date: 10/22/2019 CLINICAL DATA:  Masslike structure around right pulmonary artery during echocardiography, positive D-dimer EXAM: CT ANGIOGRAPHY CHEST WITH CONTRAST TECHNIQUE: Multidetector CT imaging of the chest was performed using the standard  protocol during bolus administration of intravenous contrast. Multiplanar CT image reconstructions and MIPs were obtained to evaluate the vascular  anatomy. CONTRAST:  9m OMNIPAQUE IOHEXOL 350 MG/ML SOLN COMPARISON:  10/20/2019 FINDINGS: Cardiovascular: This is a technically adequate evaluation of the pulmonary vasculature. There are no filling defects or pulmonary emboli. The pulmonary artery is normal in caliber. I do not see any abnormalities associated with the right pulmonary artery to explain the reported echocardiography findings. There is a trace pericardial effusion. Evaluation of the thoracic aorta is limited due to the timing of contrast bolus. No evidence of thoracic aortic aneurysm. Mediastinum/Nodes: No enlarged mediastinal, hilar, or axillary lymph nodes. Thyroid gland, trachea, and esophagus demonstrate no significant findings. Lungs/Pleura: There are small bilateral pleural effusions, volume estimated less than 500 cc each. Dependent lower lobe atelectasis. No airspace disease or pneumothorax. The central airways are patent. Upper Abdomen: Trace ascites is noted throughout the upper abdomen. Colonic interposition right upper quadrant. Postsurgical changes from previous gastric surgery. Musculoskeletal: The patient is cachectic. There are nondisplaced right posterolateral eighth and ninth rib fractures. There are nondisplaced left anterolateral seventh and eighth rib fractures. Reconstructed images demonstrate no additional findings. Review of the MIP images confirms the above findings. IMPRESSION: 1. No evidence of pulmonary embolus. I do not see any abnormalities associated with the right pulmonary artery to explain the reported echocardiography findings. 2. Small bilateral pleural effusions. 3. Trace ascites. 4. Nondisplaced bilateral rib fractures. Electronically Signed   By: MRanda NgoM.D.   On: 10/22/2019 00:37   NM Pulmonary Perfusion  Result Date: 10/20/2019 CLINICAL DATA:  [age]year old male with concern for pulmonary embolism. Positive D-dimer. EXAM: NUCLEAR MEDICINE PERFUSION LUNG SCAN TECHNIQUE: Perfusion images were  obtained in multiple projections after intravenous injection of radiopharmaceutical. Ventilation scans intentionally deferred if perfusion scan and chest x-ray adequate for interpretation during COVID 19 epidemic. RADIOPHARMACEUTICALS:  4.2 mCi Tc-967mAA IV COMPARISON:  Chest radiograph dated 10/20/2019. FINDINGS: There is homogeneous perfusion of the lungs. No focal defect identified. IMPRESSION: Normal perfusion scan. Electronically Signed   By: ArAnner Crete.D.   On: 10/20/2019 18:41    Labs: BMET Recent Labs  Lab 10/17/19 1241 10/18/19 0013 10/19/19 0336 10/20/19 0618 10/21/19 0449 10/22/19 1129  NA 133*  137 135 136 137 136 137  K 3.1*  3.1* 3.5 3.5 3.8 3.7 3.6  CL 111  108 115* 117* 113* 112* 112*  CO2 13* 14* 14* 17* 17* 17*  GLUCOSE 82  79 88 95 92 95 173*  BUN 42*  38* 41* 37* 32* 31* 29*  CREATININE 3.74*  4.10* 3.52* 3.41* 3.06* 2.81* 2.52*  CALCIUM 7.4* 7.4* 7.3* 7.2* 7.2* 7.3*  PHOS  --  5.1*  --   --   --   --    CBC Recent Labs  Lab 10/17/19 1241 10/17/19 1311 10/18/19 0013 10/18/19 0013 10/19/19 0336 10/20/19 0618 10/21/19 0449 10/22/19 1129  WBC 18.8*   < > 11.7*   < > 7.8 6.2 6.0 6.9  NEUTROABS 17.7*  --  10.9*  --   --   --   --   --   HGB 8.6*  9.2*   < > 7.8*   < > 7.3* 8.9* 9.1* 8.9*  HCT 26.5*  27.0*   < > 25.2*   < > 23.1* 28.0* 29.0* 28.9*  MCV 89.5   < > 89.0   < > 90.6 88.1 88.4 90.3  PLT 69*   < > 61*   < >  66* 61* 66* 76*   < > = values in this interval not displayed.    Medications:    . aspirin EC  81 mg Oral Daily  . Chlorhexidine Gluconate Cloth  6 each Topical Daily  . doxycycline  100 mg Oral Q12H  . DULoxetine  30 mg Oral QHS  . DULoxetine  60 mg Oral Daily  . gabapentin  300 mg Oral BID  . midodrine  2.5 mg Oral TID  . pantoprazole  40 mg Oral BID AC  . pneumococcal 23 valent vaccine  0.5 mL Intramuscular Tomorrow-1000  . sodium bicarbonate  1,300 mg Oral TID  . tamsulosin  0.4 mg Oral Daily    Gean Quint,  MD [facility] 10/22/2019, 1:09 PM

## 2019-10-22 NOTE — TOC Progression Note (Addendum)
Transition of Care [facility]) - Progression Note    Patient Details  Name: Chase [name] MRN: [id number] Date of Birth: Aug 10, 1965  Transition of Care [facility]) CM/SW [place], [place] Phone Number: 10/22/2019, 9:18 AM  Clinical Narrative:   [facility]'s [facility] and his level of service connection. Patient is only 50% service connected, but may still get a SNF approval pending his clinical picture. CSW completed referral and sent to [facility] for review. Awaiting decision on placement from the [place].    Expected Discharge Plan: Skilled Nursing Facility Barriers to Discharge: Ship broker, Continued Medical Work up, Inadequate or no insurance  Expected Discharge Plan and Services Expected Discharge Plan: [place] Choice: [place] arrangements for the past 2 months: Single Family Home                                       Social Determinants of Health (SDOH) Interventions    Readmission Risk Interventions No flowsheet data found.

## 2019-10-22 NOTE — Progress Notes (Signed)
PROGRESS NOTE    Chase [name]  BTD:[id number] DOB: March 05, 1966 DOA: 10/17/2019 PCP: [facility], [name]   Brief Narrative:  HPI on 10/17/2019 by Dr. [name] Patient is a 54 year old male with history of GERD, PTSD, CKD stage IV, history of cervical radiculopathy, chronic pain syndrome, follows pain clinic presented with left arm and left leg weakness since this morning.  Patient reports that he ambulates by himself with the help of a cane.  He last went to bed normal.  However when he woke up this morning, he was unable to move his left side.  He denied any headache, any blurry vision.  He also felt numbness and tingling in his left side.  Denied any prior history of stroke.  Patient reports that he he lives alone and called the EMS.  Denied any chest pain, shortness of breath, abdominal pain, hematochezia or melena. Also reports having dysuria, difficulty urinating, also has an appointment at [facility] for the same  Interim history Patient admitted with acute left-sided weakness, concern for acute stroke falls acute UTI, thrombocytopenia, with a tick bite.  Currently on doxycycline and ceftriaxone. 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  -Noninvasive angiography showed no LVO, or other significant atherosclerosis, carotids without stenosis -Echocardiogram EF of 60 to 36%, grade 1 diastolic dysfunction -Neurology consulted and appreciated -Patient with history of atrial fibrillation, currently not an anticoagulation candidate -TPA not given because thrombocytopenia as well as unknown onset of symptoms -Continue aspirin 81 mg -LDL 26, hemoglobin A1c 5.4 -PT recommended CIR however not in network with [facility].  TOC consulted- patient will likely go to SNF.  Acute thrombocytopenia-suspect  secondary to tickborne illness -Platelets currently stable, around 60,000 without any evidence of bleeding -Oncology/hematology and nephrology consulted and appreciated -TTP/pus relatively less likely than thrombocytopenia due to underlying infection (tickborne illness suspected, UTI) -Medication induced thrombocytopenia also unlikely -Abdominal ultrasound: Exam limited by overlying bowel gas.  Gallbladder and common bile duct not visualized.  Left hepatic lobe not visualized.  Liver has heterogeneous parenchymal pattern suggesting possibility of fatty infiltration or hepatocellular disease.  Spleen is normal size.  Increased echogenicity of both kidneys consistent with chronic medical renal disease.  Mild bilateral hydronephrosis. -LDH within normal range, Coombs test negative, reticulocytes not elevated.  ADAMTS13 33.9 -Continue to monitor CBC- labs pending this morning  Mass like Structure noted in right pulmonary artery -Noted on echocardiogram -Recommended CT PE study to exclude PE however patient does have CKD stage V at baseline -patient also noted to have elevated D-dimer, 3.19- obtained V/Q scan, which was normal -Question if patient needs a TEE- discussed this with neurology on 7/21, did not feel that TEE was indicated from stroke perspective  -Discussed obtaining CTA/PE study with nephrology.  Recommended pre and post hydration with normal saline at 100 cc/h for 6 hours prior, 6 hours post. -CTA PE study negative for PE.  Abnormalities noted are associated with the right pulmonary artery to explain echocardiogram findings.  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  Urology was consulted and patient was taken for cystoscopy showing large 300 cc posterior bladder diverticulum, purulent contents, and urine culture was showing multiple species .  Urology recommended outpatient follow-up with possible diverticulectomy.  He was treated with ceftriaxone and then cefdinir on discharge. -Patient was complaining of baseline urinary hesitancy with dysuria -Urine culture shows multiple species -Currently on ceftriaxone -Patient need to follow-up with the [facility] urology for diverticulectomy as an outpatient  BPH -Continue Flomax  Hypoalbuminemia/severe protein calorie malnutrition -As evidenced by severe loss of subcutaneous muscle mass and fat -Likely secondary to chronic illness, poor oral intake -BMI 18.5, albumin 1.6 -Nutrition consulted  PTSD/chronic pain syndrome -Continue duloxetine, gabapentin, tramadol  History of Hodgkin's lymphoma -Diagnosed in 2010 -Status post ABVD and radiation therapy which completed in 2011  Anemia of chronic disease/chronic B12 deficiency/history of gastric bypass -Hemoglobin was down to 7.3, and patient was given 1u PRBC -hemoglobin currently 9.1 -Continue B12 supplements -Work-up thus far shows a negative DAT, LDH, haptoglobin  Stage I sacral pressure injury -Present on admission -Continue wound care -Mattress has been replaced  DVT Prophylaxis  SCDs  Code Status: Full  Family Communication: None at bedside  Disposition Plan:  Status is: Inpatient  Remains inpatient appropriate because:Ongoing  diagnostic testing needed not appropriate for outpatient work up, Unsafe d/c plan and Inpatient level of care appropriate due to severity of illness.  Pending laboratory studies this morning.  Also pending safe discharge given that patient has VA benefits.   Dispo:  Patient From: Home  Planned Disposition: [place]  Expected discharge date: 2-3 days  Medically stable for discharge: No  Consultants Neurology  Nephrology Hematology PMR/CIR  Procedures  Echocardiogram Abdominal US  Antibiotics   Anti-infectives (From admission, onward)   Start     Dose/Rate Route Frequency Ordered Stop   10/22/19 2200  doxycycline (VIBRA-TABS) tablet 100 mg     Discontinue     100 mg Oral Every 12 hours 10/22/19 0810 10/31/19 2359   10/18/19 1300  doxycycline (VIBRAMYCIN) 100 mg in sodium chloride 0.9 % 250 mL IVPB  Status:  Discontinued        100 mg 125 mL/hr over 120 Minutes Intravenous Every 12 hours 10/18/19 1157 10/22/19 0809   10/18/19 1300  cefTRIAXone (ROCEPHIN) 2 g in sodium chloride 0.9 % 100 mL IVPB     Discontinue     2 g 200 mL/hr over 30 Minutes Intravenous Every 24 hours 10/18/19 1157 10/24/19 2359   10/17/19 2000  doxycycline (VIBRA-TABS) tablet 100 mg  Status:  Discontinued        100 mg Oral Every 12 hours 10/17/19 1832 10/18/19 1157   10/17/19 1415  cefTRIAXone (ROCEPHIN) 2 g in sodium chloride 0.9 % 100 mL IVPB        2 g 200 mL/hr over 30 Minutes Intravenous  Once 10/17/19 1408 10/17/19 1611      Subjective:   Chase [name] seen and examined today.  Patient feeling mildly better this morning.  Denies current chest pain or shortness of breath, abdominal pain, nausea or vomiting, diarrhea constipation, dizziness or headache.  Feels that his bottom is better given that the  mattress has been exchanged.  Patient also complains about being on a modified diet would like to be on a regular diet.   Objective:   Vitals:   10/21/19 2016 10/21/19 2316 10/22/19  0415 10/22/19 0947  BP: 125/84   (!) 137/83  Pulse:  68  65  Resp: 16  15 16   Temp: 98.3 F (36.8 C) 98 F (36.7 C) 98.1 F (36.7 C) (!) 97.5 F (36.4 C)  TempSrc: Oral Oral Oral Oral  SpO2:  97%  100%  Weight:      Height:        Intake/Output Summary (Last 24 hours) at 10/22/2019 1128 Last data filed at 10/22/2019 0951 Gross per 24 hour  Intake 315.58 ml  Output 2700 ml  Net -2384.42 ml   Filed Weights   10/17/19 1216  Weight: 55.3 kg   Exam  General: Well developed, chronically ill-appearing, thin, NAD  HEENT: NCAT, mucous membranes moist.   Cardiovascular: S1 S2 auscultated, RRR, no murmur  Respiratory: Clear to auscultation bilaterally with equal chest rise  Abdomen: Soft, nontender, nondistended, + bowel sounds  Extremities: warm dry without cyanosis clubbing or edema.  Petechiae on lower extremities bilaterally  Neuro: AAOx3, left-sided weakness otherwise nonfocal  Psych: Pleasant, appropriate mood and affect   Data Reviewed: I have personally reviewed following labs and imaging studies  CBC: Recent Labs  Lab 10/17/19 1241 10/17/19 1241 10/17/19 1311 10/18/19 0013 10/19/19 0336 10/20/19 0618 10/21/19 0449  WBC 18.8*  --   --  11.7* 7.8 6.2 6.0  NEUTROABS 17.7*  --   --  10.9*  --   --   --   HGB 8.6*  9.2*  --   --  7.8* 7.3* 8.9* 9.1*  HCT 26.5*  27.0*  --   --  25.2* 23.1* 28.0* 29.0*  MCV 89.5  --   --  89.0 90.6 88.1 88.4  PLT 69*   < > 68* 61* 66* 61* 66*   < > = values in this interval not displayed.   Basic Metabolic Panel: Recent Labs  Lab 10/17/19 1241 10/18/19 0013 10/19/19 0336 10/20/19 0618 10/21/19 0449  NA 133*  137 135 136 137 136  K 3.1*  3.1* 3.5 3.5 3.8 3.7  CL 111  108 115* 117* 113* 112*  CO2 13* 14* 14* 17* 17*  GLUCOSE 82  79 88 95 92 95  BUN 42*  38* 41* 37* 32* 31*  CREATININE 3.74*  4.10* 3.52* 3.41* 3.06* 2.81*  CALCIUM 7.4* 7.4* 7.3* 7.2* 7.2*  PHOS  --  5.1*  --   --   --    GFR: Estimated  Creatinine Clearance: 23.2 mL/min (A) (by C-G formula based on SCr of 2.81 mg/dL (H)). Liver Function Tests: Recent Labs  Lab 10/17/19 1241 10/18/19 0013 10/19/19 0336 10/20/19 0618  AST 18  --  24 26  ALT 18  --  19 19  ALKPHOS 96  --  86 94  BILITOT 0.4  --  0.7 0.5  PROT 5.6*  --  4.6* 5.0*  ALBUMIN 2.0* 1.6* 1.6* 1.6*   No results for input(s): LIPASE, AMYLASE in the last 168 hours. No results for input(s): AMMONIA in the last 168 hours. Coagulation Profile: Recent Labs  Lab 10/17/19 1241 10/17/19 1311  INR 1.5* 1.6*   Cardiac Enzymes: No results for input(s): CKTOTAL, CKMB, CKMBINDEX, TROPONINI in the last 168 hours. BNP (last 3 results) No results for input(s): PROBNP in the last 8760  hours. HbA1C: No results for input(s): HGBA1C in the last 72 hours. CBG: Recent Labs  Lab 10/17/19 1212  GLUCAP 64*   Lipid Profile: No results for input(s): CHOL, HDL, LDLCALC, TRIG, CHOLHDL, LDLDIRECT in the last 72 hours. Thyroid Function Tests: No results for input(s): TSH, T4TOTAL, FREET4, T3FREE, THYROIDAB in the last 72 hours. Anemia Panel: No results for input(s): VITAMINB12, FOLATE, FERRITIN, TIBC, IRON, RETICCTPCT in the last 72 hours. Urine analysis:    Component Value Date/Time   COLORURINE YELLOW 10/17/2019 1238   APPEARANCEUR CLOUDY (A) 10/17/2019 1238   LABSPEC 1.008 10/17/2019 1238   PHURINE 7.0 10/17/2019 1238   GLUCOSEU NEGATIVE 10/17/2019 1238   HGBUR LARGE (A) 10/17/2019 1238   BILIRUBINUR NEGATIVE 10/17/2019 1238   KETONESUR NEGATIVE 10/17/2019 1238   PROTEINUR 30 (A) 10/17/2019 1238   NITRITE NEGATIVE 10/17/2019 1238   LEUKOCYTESUR LARGE (A) 10/17/2019 1238   Sepsis Labs: @LABRCNTIP (procalcitonin:4,lacticidven:4)  ) Recent Results (from the past 240 hour(s))  SARS Coronavirus 2 by RT PCR (hospital order, performed in [place] hospital lab) Nasopharyngeal Nasopharyngeal Swab     Status: None   Collection Time: 10/17/19  1:14 PM   Specimen:  Nasopharyngeal Swab  Result Value Ref Range Status   SARS Coronavirus 2 NEGATIVE NEGATIVE Final    Comment: (NOTE) SARS-CoV-2 target nucleic acids are NOT DETECTED.  The SARS-CoV-2 RNA is generally detectable in upper and lower respiratory specimens during the acute phase of infection. The lowest concentration of SARS-CoV-2 viral copies this assay can detect is 250 copies / mL. A negative result does not preclude SARS-CoV-2 infection and should not be used as the sole basis for treatment or other patient management decisions.  A negative result may occur with improper specimen collection / handling, submission of specimen other than nasopharyngeal swab, presence of viral mutation(s) within the areas targeted by this assay, and inadequate number of viral copies (<250 copies / mL). A negative result must be combined with clinical observations, patient history, and epidemiological information.  Fact Sheet for Patients:   StrictlyIdeas.no  Fact Sheet for Healthcare Providers: BankingDealers.co.za  This test is not yet approved or  cleared by the [place] FDA and has been authorized for detection and/or diagnosis of SARS-CoV-2 by FDA under an Emergency Use Authorization (EUA).  This EUA will remain in effect (meaning this test can be used) for the duration of the COVID-19 declaration under Section 564(b)(1) of the Act, 21 U.S.C. section 360bbb-3(b)(1), unless the authorization is terminated or revoked sooner.  Performed at [facility], [address] [place], [place] [address]   Blood culture (routine x 2)     Status: None   Collection Time: 10/17/19  2:26 PM   Specimen: Left Antecubital; Blood  Result Value Ref Range Status   Specimen Description   Final    LEFT ANTECUBITAL BOTTLES DRAWN AEROBIC AND ANAEROBIC   Special Requests Blood Culture adequate volume  Final   Culture   Final    NO GROWTH 5 DAYS Performed at [facility], [address] [place], [place] [address]    Report Status 10/22/2019 FINAL  Final  Urine culture     Status: Abnormal   Collection Time: 10/17/19  2:37 PM   Specimen: Urine, Catheterized  Result Value Ref Range Status   Specimen Description   Final    URINE, CATHETERIZED Performed at [facility], [address] [place],  [address]    Special Requests   Final    NONE Performed  at [facility], [address], [place], [place] [address]    Culture MULTIPLE SPECIES PRESENT, SUGGEST RECOLLECTION (A)  Final   Report Status 10/19/2019 FINAL  Final  Blood culture (routine x 2)     Status: None   Collection Time: 10/17/19  2:41 PM   Specimen: Right Antecubital; Blood  Result Value Ref Range Status   Specimen Description   Final    RIGHT ANTECUBITAL BOTTLES DRAWN AEROBIC AND ANAEROBIC   Special Requests Blood Culture adequate volume  Final   Culture   Final    NO GROWTH 5 DAYS Performed at [facility], [address], [place], [place] [address]    Report Status 10/22/2019 FINAL  Final      Radiology Studies: DG Chest 1 View  Result Date: 10/20/2019 CLINICAL DATA:  Elevated D-dimer. History of chemical exposure, GI bleeding. EXAM: CHEST  1 VIEW COMPARISON:  10/17/2019 FINDINGS: Shallow lung inflation. Heart size is normal. There are no focal consolidations or pleural effusions. IMPRESSION: Shallow inflation.  No evidence for acute  abnormality. Electronically Signed   By: Nolon Nations M.D.   On: 10/20/2019 18:44   CT ANGIO CHEST PE W OR WO CONTRAST  Result Date: 10/22/2019 CLINICAL DATA:  Masslike structure around right pulmonary artery during echocardiography, positive D-dimer EXAM: CT ANGIOGRAPHY CHEST WITH CONTRAST TECHNIQUE: Multidetector CT imaging of the chest was performed using the standard protocol during bolus administration of intravenous contrast. Multiplanar CT image reconstructions and MIPs were obtained to evaluate the vascular anatomy. CONTRAST:  50mL  OMNIPAQUE IOHEXOL 350 MG/ML SOLN COMPARISON:  10/20/2019 FINDINGS: Cardiovascular: This is a technically adequate evaluation of the pulmonary vasculature. There are no filling defects or pulmonary emboli. The pulmonary artery is normal in caliber. I do not see any abnormalities associated with the right pulmonary artery to explain the reported echocardiography findings. There is a trace pericardial effusion. Evaluation of the thoracic aorta is limited due to the timing of contrast bolus. No evidence of thoracic aortic aneurysm. Mediastinum/Nodes: No enlarged mediastinal, hilar, or axillary lymph nodes. Thyroid gland, trachea, and esophagus demonstrate no significant findings. Lungs/Pleura: There are small bilateral pleural effusions, volume estimated less than 500 cc each. Dependent lower lobe atelectasis. No airspace disease or pneumothorax. The central airways are patent. Upper Abdomen: Trace ascites is noted throughout the upper abdomen. Colonic interposition right upper quadrant. Postsurgical changes from previous gastric surgery. Musculoskeletal: The patient is cachectic. There are nondisplaced right posterolateral eighth and ninth rib fractures. There are nondisplaced left anterolateral seventh and eighth rib fractures. Reconstructed images demonstrate no additional findings. Review of the MIP images confirms the above findings. IMPRESSION: 1. No evidence of pulmonary embolus. I do not see any abnormalities associated with the right pulmonary artery to explain the reported echocardiography findings. 2. Small bilateral pleural effusions. 3. Trace ascites. 4. Nondisplaced bilateral rib fractures. Electronically Signed   By: Randa Ngo M.D.   On: 10/22/2019 00:37   NM Pulmonary Perfusion  Result Date: 10/20/2019 CLINICAL DATA:  54 year old male with concern for pulmonary embolism. Positive D-dimer. EXAM: NUCLEAR MEDICINE PERFUSION LUNG SCAN TECHNIQUE: Perfusion images were obtained in multiple projections  after intravenous injection of radiopharmaceutical. Ventilation scans intentionally deferred if perfusion scan and chest x-ray adequate for interpretation during COVID 19 epidemic. RADIOPHARMACEUTICALS:  4.2 mCi Tc-61m MAA IV COMPARISON:  Chest radiograph dated 10/20/2019. FINDINGS: There is homogeneous perfusion of the lungs. No focal defect identified. IMPRESSION: Normal perfusion scan. Electronically Signed   By: Laren Everts.D.  On: 10/20/2019 18:41     Scheduled Meds: . aspirin EC  81 mg Oral Daily  . Chlorhexidine Gluconate Cloth  6 each Topical Daily  . doxycycline  100 mg Oral Q12H  . DULoxetine  30 mg Oral QHS  . DULoxetine  60 mg Oral Daily  . gabapentin  300 mg Oral BID  . midodrine  2.5 mg Oral TID  . pantoprazole  40 mg Oral BID AC  . pneumococcal 23 valent vaccine  0.5 mL Intramuscular Tomorrow-1000  . sodium bicarbonate  1,300 mg Oral TID  . tamsulosin  0.4 mg Oral Daily   Continuous Infusions: . cefTRIAXone (ROCEPHIN)  IV 2 g (10/21/19 1307)     LOS: 5 days   Time Spent in minutes   45 minutes  Bashar Milam D.O. on 10/22/2019 at 11:28 AM  Between 7am to 7pm - Please see pager noted on [email or web address]  After 7pm go to [email or web address]  And look for the night coverage person covering for me after hours  [facility] Office  [phone]

## 2019-10-22 NOTE — Progress Notes (Signed)
Initial Nutrition Assessment  DOCUMENTATION CODES:   Severe malnutrition in context of chronic illness  INTERVENTION:  Ensure Enlive po TID, each supplement provides 350 kcal and 20 grams of protein  MVI daily  Magic cup TID with meals, each supplement provides 290 kcal and 9 grams of protein  NUTRITION DIAGNOSIS:   Severe Malnutrition related to chronic illness as evidenced by severe muscle depletion, severe fat depletion.    GOAL:   Patient will meet greater than or equal to 90% of their needs    MONITOR:   PO intake, Supplement acceptance, Weight trends, Labs, I & O's  REASON FOR ASSESSMENT:   Consult Assessment of nutrition requirement/status  ASSESSMENT:   Pt admitted with acute CVA. Pt also with acute thrombocytopenia (suspected to be 2/2 tickborne illness) and UTI/chronic urinary retention with large bladder diverticulum. PMH includes GERD,multiple UTIs and obstruction, hodgkins lymphoma, hx gastric bypass ('96), BPH,  PTSD, CKD stage IV, hx of cervical radiculopathy, chronic pain syndrome.  Pt providing very short responses to RD questions. Pt states he does eat 3 small meals per day.  Pt with limited wt hx. Pt does report wt loss, but is unable to quantify amount or timeframe.   PO Intake: 0-100% x 6 recorded meals (50% average meal intake)  UOP: 2,560ml x24 hours I/O: -6,328.43ml since admit  Labs and medications reviewed.  NUTRITION - FOCUSED PHYSICAL EXAM:    Most Recent Value  Orbital Region Severe depletion  Upper Arm Region Severe depletion  Thoracic and Lumbar Region Severe depletion  Buccal Region Moderate depletion  Temple Region Severe depletion  Clavicle Bone Region Severe depletion  Clavicle and Acromion Bone Region Severe depletion  Scapular Bone Region Severe depletion  Dorsal Hand Moderate depletion  Patellar Region Severe depletion  Anterior Thigh Region Severe depletion  Posterior Calf Region Severe depletion  Edema (RD  Assessment) Mild  Hair Reviewed  Eyes Reviewed  Mouth Reviewed  Skin Reviewed  Nails Reviewed       Diet Order:   Diet Order            Diet regular Room service appropriate? Yes; Fluid consistency: Thin  Diet effective now                 EDUCATION NEEDS:   No education needs have been identified at this time  Skin:  Skin Assessment: Reviewed RN Assessment  Last BM:  7/22  Height:   Ht Readings from Last 1 Encounters:  10/17/19 5\' 8"  (1.727 m)    Weight:   Wt Readings from Last 10 Encounters:  10/17/19 55.3 kg    BMI:  Body mass index is 18.55 kg/m.  Estimated Nutritional Needs:   Kcal:  2050-2250  Protein:  100-110 grams  Fluid:  >2L/d    Larkin Ina, MS, RD, LDN RD pager number and weekend/on-call pager number located in [facility].

## 2019-10-22 NOTE — TOC Progression Note (Signed)
Transition of Care [facility]) - Progression Note    Patient Details  Name: Chase [name] MRN: [id number] Date of Birth: July 20, 1965  Transition of Care [facility]) CM/SW [place], [place] Phone Number: 10/22/2019, 3:42 PM  Clinical Narrative:   CSW received call from [place] that patient has been approved for a 60-day SNF contract. CSW to pursue [facility] SN[facility] bed upon completion of medical workup.    Expected Discharge Plan: Skilled Nursing Facility Barriers to Discharge: Ship broker, Continued Medical Work up, Inadequate or no insurance  Expected Discharge Plan and Services Expected Discharge Plan: [place] Choice: [place] arrangements for the past 2 months: Single Family Home                                       Social Determinants of Health (SDOH) Interventions    Readmission Risk Interventions No flowsheet data found.

## 2019-10-23 DIAGNOSIS — N184 Chronic kidney disease, stage 4 (severe): Secondary | ICD-10-CM | POA: Diagnosis not present

## 2019-10-23 DIAGNOSIS — G8921 Chronic pain due to trauma: Secondary | ICD-10-CM | POA: Diagnosis not present

## 2019-10-23 DIAGNOSIS — N390 Urinary tract infection, site not specified: Secondary | ICD-10-CM | POA: Diagnosis not present

## 2019-10-23 DIAGNOSIS — W19XXXA Unspecified fall, initial encounter: Secondary | ICD-10-CM | POA: Diagnosis not present

## 2019-10-23 DIAGNOSIS — E43 Unspecified severe protein-calorie malnutrition: Secondary | ICD-10-CM | POA: Insufficient documentation

## 2019-10-23 MED ORDER — ONDANSETRON HCL 4 MG/2ML IJ SOLN: 4.0000 mg | Freq: Four times a day (QID) | INTRAMUSCULAR | Status: DC | PRN

## 2019-10-23 NOTE — Progress Notes (Signed)
PROGRESS NOTE    Chase [name]  AQT:[id number] DOB: 1966-03-31 DOA: 10/17/2019 PCP: [name]   Brief Narrative:  HPI on 10/17/2019 by Dr. [name] Patient is a 54 year old male with history of GERD, PTSD, CKD stage IV, history of cervical radiculopathy, chronic pain syndrome, follows pain clinic presented with left arm and left leg weakness since this morning.  Patient reports that he ambulates by himself with the help of a cane.  He last went to bed normal.  However when he woke up this morning, he was unable to move his left side.  He denied any headache, any blurry vision.  He also felt numbness and tingling in his left side.  Denied any prior history of stroke.  Patient reports that he he lives alone and called the EMS.  Denied any chest pain, shortness of breath, abdominal pain, hematochezia or melena. Also reports having dysuria, difficulty urinating, also has an appointment at [facility] for the same  Interim history Patient admitted with acute left-sided weakness, concern for acute stroke falls acute UTI, thrombocytopenia, with a tick bite.  Currently on doxycycline and ceftriaxone. 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  -Noninvasive angiography showed no LVO, or other significant atherosclerosis, carotids without stenosis -Echocardiogram EF of 60 to 56%, grade 1 diastolic dysfunction -Neurology consulted and appreciated -Patient with history of atrial fibrillation, currently not an anticoagulation candidate -TPA not given because thrombocytopenia as well as unknown onset of symptoms -Continue aspirin 81 mg -LDL 26, hemoglobin A1c 5.4 -PT recommended CIR however not in network with [facility].  TOC consulted- patient will likely go to SNF.  Acute thrombocytopenia-suspect  secondary to tickborne illness -Platelets currently stable, around 60,000 without any evidence of bleeding -Oncology/hematology and nephrology consulted and appreciated -TTP/pus relatively less likely than thrombocytopenia due to underlying infection (tickborne illness suspected, UTI) -Medication induced thrombocytopenia also unlikely -Abdominal ultrasound: Exam limited by overlying bowel gas.  Gallbladder and common bile duct not visualized.  Left hepatic lobe not visualized.  Liver has heterogeneous parenchymal pattern suggesting possibility of fatty infiltration or hepatocellular disease.  Spleen is normal size.  Increased echogenicity of both kidneys consistent with chronic medical renal disease.  Mild bilateral hydronephrosis. -LDH within normal range, Coombs test negative, reticulocytes not elevated.  ADAMTS13 33.9 -Hemoglobin stable  Mass like Structure noted in right pulmonary artery -Noted on echocardiogram -Recommended CT PE study to exclude PE however patient does have CKD stage V at baseline -patient also noted to have elevated D-dimer, 3.19- obtained V/Q scan, which was normal -Question if patient needs a TEE- discussed this with neurology on 7/21, did not feel that TEE was indicated from stroke perspective  -Discussed obtaining CTA/PE study with nephrology.  Recommended pre and post hydration with normal saline at 100 cc/h for 6 hours prior, 6 hours post. -CTA PE study negative for PE.  Abnormalities noted are associated with the right pulmonary artery to explain echocardiogram findings.  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  Urology was consulted and patient was taken for cystoscopy showing large 300 cc posterior bladder diverticulum, purulent contents, and urine culture was showing multiple species .  Urology recommended outpatient follow-up with possible diverticulectomy.  He was treated with ceftriaxone and then cefdinir on discharge. -Patient was complaining of baseline urinary hesitancy with dysuria -Urine culture shows multiple species -Currently on ceftriaxone -Patient need to follow-up with the [facility] urology for diverticulectomy as an outpatient  BPH -Continue Flomax  Hypoalbuminemia/severe protein calorie malnutrition -As evidenced by severe loss of subcutaneous muscle mass and fat -Likely secondary to chronic illness, poor oral intake -BMI 18.5, albumin 1.6 -Nutrition consulted  PTSD/chronic pain syndrome -Continue duloxetine, gabapentin, tramadol  History of Hodgkin's lymphoma -Diagnosed in 2010 -Status post ABVD and radiation therapy which completed in 2011  Anemia of chronic disease/chronic B12 deficiency/history of gastric bypass -Hemoglobin was down to 7.3, and patient was given 1u PRBC -hemoglobin currently 8.9 -Continue B12 supplements -Work-up thus far shows a negative DAT, LDH, haptoglobin  Stage I sacral pressure injury -Present on admission -Continue wound care -Mattress has been replaced  DVT Prophylaxis  SCDs  Code Status: Full  Family Communication: None at bedside  Disposition Plan:  Status is: Inpatient  Remains inpatient appropriate because:Ongoing diagnostic testing needed not  appropriate for outpatient work up, Unsafe d/c plan and Inpatient level of care appropriate due to severity of illness.  Pending laboratory studies this morning.  Also pending safe discharge given that patient has [employer] benefits.   Dispo:  Patient From: Home  Planned Disposition: [place]  Expected discharge date: 2-3 days  Medically stable for discharge: No  Consultants Neurology  Nephrology Hematology PMR/CIR  Procedures  Echocardiogram Abdominal US  Antibiotics   Anti-infectives (From admission, onward)   Start     Dose/Rate Route Frequency Ordered Stop   10/22/19 2200  doxycycline (VIBRA-TABS) tablet 100 mg     Discontinue     100 mg Oral Every 12 hours 10/22/19 0810 10/31/19 2359   10/18/19 1300  doxycycline (VIBRAMYCIN) 100 mg in sodium chloride 0.9 % 250 mL IVPB  Status:  Discontinued        100 mg 125 mL/hr over 120 Minutes Intravenous Every 12 hours 10/18/19 1157 10/22/19 0809   10/18/19 1300  cefTRIAXone (ROCEPHIN) 2 g in sodium chloride 0.9 % 100 mL IVPB     Discontinue     2 g 200 mL/hr over 30 Minutes Intravenous Every 24 hours 10/18/19 1157 10/24/19 2359   10/17/19 2000  doxycycline (VIBRA-TABS) tablet 100 mg  Status:  Discontinued        100 mg Oral Every 12 hours 10/17/19 1832 10/18/19 1157   10/17/19 1415  cefTRIAXone (ROCEPHIN) 2 g in sodium chloride 0.9 % 100 mL IVPB        2 g 200 mL/hr over 30 Minutes Intravenous  Once 10/17/19 1408 10/17/19 1611      Subjective:   [name] [name] seen and examined today.  Patient feeling better this morning and happy to have a regular diet. Denies current chest pain, shortness of breath, abdominal pain, dizziness, headache.    Objective:   Vitals:   10/22/19 1928 10/22/19 2322 10/23/19 0321 10/23/19 1093  BP:    (!) 135/76  Pulse:    66  Resp: 16 16 16 16   Temp: 98.4 F (36.9 C) 97.7 F (36.5 C) 98 F (36.7 C) 97.9 F (36.6 C)  TempSrc: Oral Oral Oral Oral  SpO2:    100%  Weight:       Height:        Intake/Output Summary (Last 24 hours) at 10/23/2019 1125 Last data filed at 10/23/2019 0949 Gross per 24 hour  Intake 120 ml  Output 2600 ml  Net -2480 ml   Filed Weights   10/17/19 1216  Weight: 55.3 kg   Exam  General: Well developed, chronically ill-appearing, thin, NAD  HEENT: NCAT, mucous membranes moist.   Cardiovascular: S1 S2 auscultated, RRR, no murmur  Respiratory: Clear to auscultation bilaterally  Abdomen: Soft, nontender, nondistended, + bowel sounds  Extremities: warm dry without cyanosis clubbing or edema. Petechiae on lower ext b/L  Neuro: AAOx3, left sided weakness, otherwise nonfocal  Psych: pleasant, appropriate mood and affect   Data Reviewed: I have personally reviewed following labs and imaging studies  CBC: Recent Labs  Lab 10/17/19 1241 10/17/19 1311 10/18/19 0013 10/19/19 0336 10/20/19 0618 10/21/19 0449 10/22/19 1129  WBC 18.8*   < > 11.7* 7.8 6.2 6.0 6.9  NEUTROABS 17.7*  --  10.9*  --   --   --   --   HGB 8.6*  9.2*   < > 7.8* 7.3* 8.9* 9.1* 8.9*  HCT 26.5*  27.0*   < > 25.2* 23.1* 28.0* 29.0* 28.9*  MCV 89.5   < > 89.0 90.6 88.1 88.4 90.3  PLT 69*   < > 61* 66* 61* 66* 76*   < > = values in this interval not displayed.   Basic Metabolic Panel: Recent Labs  Lab 10/18/19 0013 10/19/19 0336 10/20/19 0618 10/21/19 0449 10/22/19 1129  NA 135 136 137 136 137  K 3.5 3.5 3.8 3.7 3.6  CL 115* 117* 113* 112* 112*  CO2 14* 14* 17* 17* 17*  GLUCOSE 88 95 92 95 173*  BUN 41* 37* 32* 31* 29*  CREATININE 3.52* 3.41* 3.06* 2.81* 2.52*  CALCIUM 7.4* 7.3* 7.2* 7.2* 7.3*  PHOS 5.1*  --   --   --   --    GFR: Estimated Creatinine Clearance: 25.9 mL/min (A) (by C-G formula based on SCr of 2.52 mg/dL (H)). Liver Function Tests: Recent Labs  Lab 10/17/19 1241 10/18/19 0013 10/19/19 0336 10/20/19 0618  AST 18  --  24 26  ALT 18  --  19 19  ALKPHOS 96  --  86 94  BILITOT 0.4  --  0.7 0.5  PROT 5.6*  --  4.6* 5.0*   ALBUMIN 2.0* 1.6* 1.6* 1.6*   No results for input(s): LIPASE, AMYLASE in the last 168 hours. No results for input(s): AMMONIA in the last 168 hours. Coagulation Profile: Recent Labs  Lab 10/17/19 1241 10/17/19 1311  INR 1.5* 1.6*   Cardiac Enzymes: No results for input(s): CKTOTAL, CKMB, CKMBINDEX, TROPONINI in the last 168 hours. BNP (last 3 results) No results for input(s): PROBNP in the last 8760 hours. HbA1C: No results for input(s): HGBA1C in the last 72 hours. CBG: Recent Labs  Lab 10/17/19 1212  GLUCAP 64*   Lipid Profile: No results for input(s): CHOL, HDL, LDLCALC, TRIG, CHOLHDL, LDLDIRECT in the last 72 hours. Thyroid Function Tests: No results for input(s): TSH, T4TOTAL, FREET4, T3FREE, THYROIDAB in the last 72 hours. Anemia Panel:  No results for input(s): VITAMINB12, FOLATE, FERRITIN, TIBC, IRON, RETICCTPCT in the last 72 hours. Urine analysis:    Component Value Date/Time   COLORURINE YELLOW 10/17/2019 1238   APPEARANCEUR CLOUDY (A) 10/17/2019 1238   LABSPEC 1.008 10/17/2019 1238   PHURINE 7.0 10/17/2019 1238   GLUCOSEU NEGATIVE 10/17/2019 1238   HGBUR LARGE (A) 10/17/2019 1238   BILIRUBINUR NEGATIVE 10/17/2019 1238   KETONESUR NEGATIVE 10/17/2019 1238   PROTEINUR 30 (A) 10/17/2019 1238   NITRITE NEGATIVE 10/17/2019 1238   LEUKOCYTESUR LARGE (A) 10/17/2019 1238   Sepsis Labs: @LABRCNTIP (procalcitonin:4,lacticidven:4)  ) Recent Results (from the past 240 hour(s))  SARS Coronavirus 2 by RT PCR (hospital order, performed in [place] hospital lab) Nasopharyngeal Nasopharyngeal Swab     Status: None   Collection Time: 10/17/19  1:14 PM   Specimen: Nasopharyngeal Swab  Result Value Ref Range Status   SARS Coronavirus 2 NEGATIVE NEGATIVE Final    Comment: (NOTE) SARS-CoV-2 target nucleic acids are NOT DETECTED.  The SARS-CoV-2 RNA is generally detectable in upper and lower respiratory specimens during the acute phase of infection. The  lowest concentration of SARS-CoV-2 viral copies this assay can detect is 250 copies / mL. A negative result does not preclude SARS-CoV-2 infection and should not be used as the sole basis for treatment or other patient management decisions.  A negative result may occur with improper specimen collection / handling, submission of specimen other than nasopharyngeal swab, presence of viral mutation(s) within the areas targeted by this assay, and inadequate number of viral copies (<250 copies / mL). A negative result must be combined with clinical observations, patient history, and epidemiological information.  Fact Sheet for Patients:   StrictlyIdeas.no  Fact Sheet for Healthcare Providers: BankingDealers.co.za  This test is not yet approved or  cleared by the [place] FDA and has been authorized for detection and/or diagnosis of SARS-CoV-2 by FDA under an Emergency Use Authorization (EUA).  This EUA will remain in effect (meaning this test can be used) for the duration of the COVID-19 declaration under Section 564(b)(1) of the Act, 21 U.S.C. section 360bbb-3(b)(1), unless the authorization is terminated or revoked sooner.  Performed at [facility], [address], [place], [place] [address]   Blood culture (routine x 2)     Status: None   Collection Time: 10/17/19  2:26 PM   Specimen: Left Antecubital; Blood  Result Value Ref Range Status   Specimen Description   Final    LEFT ANTECUBITAL BOTTLES DRAWN AEROBIC AND ANAEROBIC   Special Requests Blood Culture adequate volume  Final   Culture   Final    NO GROWTH 5 DAYS Performed at [facility] [address] [place], [place] [address]    Report Status 10/22/2019 FINAL  Final  Urine culture     Status: Abnormal   Collection Time: 10/17/19  2:37 PM   Specimen: Urine, Catheterized  Result Value Ref Range Status   Specimen Description   Final    URINE, CATHETERIZED Performed at  [facility], [address], [place], [place] [address]    Special Requests   Final    NONE Performed at [facility], [address] [place], [place] [address]    Culture MULTIPLE SPECIES PRESENT, SUGGEST RECOLLECTION (A)  Final   Report Status 10/19/2019 FINAL  Final  Blood culture (routine x 2)     Status: None   Collection Time: 10/17/19  2:41 PM   Specimen: Right Antecubital; Blood  Result Value Ref  Range Status   Specimen Description   Final    RIGHT ANTECUBITAL BOTTLES DRAWN AEROBIC AND ANAEROBIC   Special Requests Blood Culture adequate volume  Final   Culture   Final    NO GROWTH 5 DAYS Performed at [facility], [address], [place], [place] [address]    Report Status 10/22/2019 FINAL  Final      Radiology Studies: CT ANGIO CHEST PE W OR WO CONTRAST  Result Date: 10/22/2019 CLINICAL DATA:  Masslike structure around right pulmonary artery during echocardiography, positive D-dimer EXAM: CT ANGIOGRAPHY CHEST WITH CONTRAST TECHNIQUE: Multidetector CT imaging of the chest was performed using the standard protocol during bolus administration of intravenous contrast. Multiplanar CT image reconstructions and MIPs were obtained to evaluate the vascular anatomy. CONTRAST:  74mL OMNIPAQUE IOHEXOL 350 MG/ML SOLN COMPARISON:  10/20/2019 FINDINGS: Cardiovascular: This is a technically adequate evaluation of the pulmonary vasculature. There are no filling defects or pulmonary emboli. The pulmonary artery is normal in caliber. I do not see any abnormalities associated with the right pulmonary artery to explain the reported echocardiography findings. There is a trace pericardial effusion. Evaluation of the thoracic aorta is limited due to the timing of contrast bolus. No evidence of thoracic aortic aneurysm. Mediastinum/Nodes: No enlarged mediastinal, hilar, or axillary lymph nodes. Thyroid gland, trachea, and esophagus demonstrate no significant findings. Lungs/Pleura: There are small bilateral  pleural effusions, volume estimated less than 500 cc each. Dependent lower lobe atelectasis. No airspace disease or pneumothorax. The central airways are patent. Upper Abdomen: Trace ascites is noted throughout the upper abdomen. Colonic interposition right upper quadrant. Postsurgical changes from previous gastric surgery. Musculoskeletal: The patient is cachectic. There are nondisplaced right posterolateral eighth and ninth rib fractures. There are nondisplaced left anterolateral seventh and eighth rib fractures. Reconstructed images demonstrate no additional findings. Review of the MIP images confirms the above findings. IMPRESSION: 1. No evidence of pulmonary embolus. I do not see any abnormalities associated with the right pulmonary artery to explain the reported echocardiography findings. 2. Small bilateral pleural effusions. 3. Trace ascites. 4. Nondisplaced bilateral rib fractures. Electronically Signed   By: Randa Ngo M.D.   On: 10/22/2019 00:37     Scheduled Meds: . aspirin EC  81 mg Oral Daily  . Chlorhexidine Gluconate Cloth  6 each Topical Daily  . doxycycline  100 mg Oral Q12H  . DULoxetine  30 mg Oral QHS  . DULoxetine  60 mg Oral Daily  . feeding supplement (ENSURE ENLIVE)  237 mL Oral TID BM  . gabapentin  300 mg Oral BID  . midodrine  2.5 mg Oral TID  . multivitamin with minerals  1 tablet Oral Daily  . pantoprazole  40 mg Oral BID AC  . pneumococcal 23 valent vaccine  0.5 mL Intramuscular Tomorrow-1000  . sodium bicarbonate  1,950 mg Oral TID  . tamsulosin  0.4 mg Oral Daily   Continuous Infusions: . cefTRIAXone (ROCEPHIN)  IV 2 g (10/22/19 1158)     LOS: 6 days   Time Spent in minutes  30 minutes  Meleni Delahunt D.O. on 10/23/2019 at 11:25 AM  Between 7am to 7pm - Please see pager noted on [email or web address]  After 7pm go to [email or web address]  And look for the night coverage person covering for me after hours  [facility] Office  [phone]

## 2019-10-23 NOTE — Progress Notes (Signed)
Physical Therapy Treatment Patient Details Name: Chase [name] MRN: [id number] DOB: 13-Oct-1965 Today's Date: 10/23/2019    History of Present Illness Pt is a 54 y.o. male with PMH of cervical post-laminectomy syndrome, arthalgia of multiple sites, hodgkin's disease, PTSD, CKD, chronic pain syndrome, and anemia presenting to ED with L sided weakness, slurred speech, and fall. 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    PT Comments    Pt reports fatigue on arrival but is willing to participate with therapy. This session focused on postural control performing reaching activities and stabilizing trunk in recliner chair. He requires frequent cues for forward gaze. He can feel when he is leaning to the R but requires assist for correct. Pt quickly fatigues and returns to R lateral leaning position. Patient would benefit from continued skilled PT to maximize functional independence and safety with mobility. Will continue to follow acutely.    Follow Up Recommendations  CIR     Equipment Recommendations  None recommended by PT    Recommendations for Other Services Rehab consult     Precautions / Restrictions Precautions Precautions: Fall Precaution Comments: Stage 3 coccyx wound per WOC note Restrictions Weight Bearing Restrictions: No    Mobility  Bed Mobility Overal bed mobility: Needs Assistance Bed Mobility: Supine to Sit     Supine to sit: Mod assist;+2 for physical assistance     General bed mobility comments: Assist required for LLE progression and trunk elevation. VC for sequencing and technique  Transfers Overall transfer level: Needs assistance Equipment used: 2 person hand held assist Transfers: Sit to/from Stand Sit to Stand: Min assist;+2 physical assistance         General transfer comment: Min A to power up +2 for stability.  Ambulation/Gait Ambulation/Gait assistance: Mod assist;+2 physical assistance Gait Distance (Feet):  4 Feet Assistive device: 2 person hand held assist Gait Pattern/deviations: Step-to pattern;Decreased step length - right;Decreased step length - left;Decreased stride length Gait velocity: Decreased Gait velocity interpretation: <1.31 ft/sec, indicative of household ambulator General Gait Details: Dificulty advancing LLE and increased time required to progress RLE. pt fatigues quickly. Took small shuffling steps to recliner chair.   Stairs             Wheelchair Mobility    Modified Rankin (Stroke Patients Only) Modified Rankin (Stroke Patients Only) Pre-Morbid Rankin Score: Moderate disability Modified Rankin: Moderately severe disability     Balance Overall balance assessment: Needs assistance Sitting-balance support: Bilateral upper extremity supported;Feet supported Sitting balance-Leahy Scale: Poor Sitting balance - Comments: Worked on sitting balance in recliner chair. Pt given assist to sit upright and told to hold as support was gradually removed. Pt was able to sit upright in chair with intermittant assist for ~10 min. Occasional reaching activities to correct R lean. Had pt lean on L elbow to promote WB through the L shoulder and correct lean. Frequent cues required to look forward and keep eyes open. Postural control: Right lateral lean Standing balance support: Bilateral upper extremity supported;During functional activity Standing balance-Leahy Scale: Poor Standing balance comment: reliant on UE support and external assist                            Cognition Arousal/Alertness: Lethargic;Awake/alert Behavior During Therapy: Flat affect Overall Cognitive Status: Impaired/Different from baseline Area of Impairment: Problem solving;Awareness;Following commands;Attention                   Current  Attention Level: Selective   Following Commands: Follows one step commands with increased time   Awareness: Emergent Problem Solving: Slow  processing;Decreased initiation;Difficulty sequencing;Requires verbal cues;Requires tactile cues General Comments: able to state that it was the weekend without prompting. Aware that he is leaning to one side and asking appropriate questions about d/c      Exercises      General Comments General comments (skin integrity, edema, etc.): Stage 3 coccyx wound      Pertinent Vitals/Pain Pain Assessment: Faces Faces Pain Scale: Hurts little more Pain Location: coccyx/buttock Pain Descriptors / Indicators: Discomfort;Grimacing;Guarding;Tender Pain Intervention(s): Monitored during session;Limited activity within patient's tolerance;Repositioned    Home Living                      Prior Function            PT Goals (current goals can now be found in the care plan section) Acute Rehab PT Goals Patient Stated Goal: To drive my new car ([employer]) PT Goal Formulation: With patient Time For Goal Achievement: 11/01/19 Potential to Achieve Goals: Good Progress towards PT goals: Progressing toward goals    Frequency    Min 4X/week      PT Plan Current plan remains appropriate    Co-evaluation              AM-PAC PT "6 Clicks" Mobility   Outcome Measure  Help needed turning from your back to your side while in a flat bed without using bedrails?: A Lot Help needed moving from lying on your back to sitting on the side of a flat bed without using bedrails?: A Lot Help needed moving to and from a bed to a chair (including a wheelchair)?: A Lot Help needed standing up from a chair using your arms (e.g., wheelchair or bedside chair)?: A Lot Help needed to walk in hospital room?: A Lot Help needed climbing 3-5 steps with a railing? : Total 6 Click Score: 11    End of Session Equipment Utilized During Treatment: Gait belt Activity Tolerance: Patient tolerated treatment well Patient left: in chair;with call bell/phone within reach;with chair alarm set Nurse  Communication: Mobility status PT Visit Diagnosis: Unsteadiness on feet (R26.81);Hemiplegia and hemiparesis;Muscle weakness (generalized) (M62.81) Hemiplegia - Right/Left: Left Hemiplegia - dominant/non-dominant: Non-dominant Hemiplegia - caused by: Cerebral infarction     Time: 7341-9379 PT Time Calculation (min) (ACUTE ONLY): 25 min  Charges:  $Neuromuscular Re-education: 23-37 mins                     Benjiman Core, [place] Pager [phone] Acute Rehab   Allena Katz 10/23/2019, 11:43 AM

## 2019-10-23 NOTE — Progress Notes (Signed)
[facility] Progress Note    Assessment/ Plan:   54 year old male past medical history of BPH, multiple episodes of UTI and obstruction, Hodgkin's lymphoma, CKD stage IV, coronary artery disease, gastric bypass 1996, cervical radiculopathy, PTSD, chronic pain syndrome who presented with weakness found to have stroke.  Consulted for thrombocytopenia with concerns for TTP.  1. CKD4 underlying etiology likely related to multiple episodes of AKI secondary to BPH/obstruction -At present his renal function is currently at baseline and stable (slightly better yesterday with a creatinine down to 2.5).  Seems that his baseline creatinine is largely fluctuant between 3 and 4.5 -Post contrast hydration completed -Overall looking at trajectory of creatinine.  His overall EGFR is hard to estimate given his lack of muscle mass -can follow up with his nephrologist at the [facility] post discharge -Avoid nephrotoxic medications including NSAIDs and iodinated intravenous contrast exposure unless the latter is absolutely indicated.  Preferred narcotic agents for pain control are hydromorphone, fentanyl, and methadone. Morphine should not be used. Avoid Baclofen and avoid oral sodium phosphate and magnesium citrate based laxatives / bowel preps. Continue strict Input and Output monitoring. Will monitor the patient closely with you and intervene or adjust therapy as indicated by changes in clinical status/labs  2.  Thrombocytopenia, less likely to be TTP more possibly related to underlying infection/tickborne illness -Renal function stable/improved.  Platelet count today slightly better yesterday.  Peripheral smear was reviewed by hematology on 7/19 which revealed no significant schistocytes.  LDH and haptoglobin were also normal.  ADAMTS13 33.9 (low but not <10). Shiga toxin still pending (low suspicion for this).  No indication for plasmapheresis at this junction which I discussed with hematology at the day of  consultation. 3.  Metabolic acidosis secondary to chronic kidney disease along with history of gastric bypass -Increased sodium bicarbonate to 1950 mg 3 times daily 4. H/o recurrent urinary obstruction.  Maintain Foley (placed at [facility] back in May). 5.  Bladder diverticulum.  Can follow-up with urology as an outpatient 6. Anemia of chronic kidney disease and b12 deficiency. Check iron panel. s/p 1u prbc 7/21  Will sign off from nephrology perspective. Thank you for involving [place] in the care of this patient. Please call with any questions/concerns.  Subjective:   No acute events overnight, no new complaints. Feels better in regards to appetite. No labs today.   Objective:   BP (!) 130/91 (BP Location: Right Arm)   Pulse 71   Temp (!) 97.2 F (36.2 C) (Axillary)   Resp 20   Ht 5' 8" (1.727 m)   Wt 55.3 kg   SpO2 100%   BMI 18.55 kg/m   Intake/Output Summary (Last 24 hours) at 10/23/2019 1306 Last data filed at 10/23/2019 7654 Gross per 24 hour  Intake 120 ml  Output 2350 ml  Net -2230 ml   Weight change:   Physical Exam: Gen:NAD, chronically ill appearing, laying flat in bed HEENT: temporal wasting  CVS: reg rate Resp: normal wob Abd: non-distended Skin: petechiae bl le's (unchanged) Ext:no edema GU: +foley Neuro: Speech clear and coherent, left-sided weakness  Imaging: CT ANGIO CHEST PE W OR WO CONTRAST  Result Date: 10/22/2019 CLINICAL DATA:  Masslike structure around right pulmonary artery during echocardiography, positive D-dimer EXAM: CT ANGIOGRAPHY CHEST WITH CONTRAST TECHNIQUE: Multidetector CT imaging of the chest was performed using the standard protocol during bolus administration of intravenous contrast. Multiplanar CT image reconstructions and MIPs were obtained to evaluate the vascular anatomy. CONTRAST:  25m OMNIPAQUE  IOHEXOL 350 MG/ML SOLN COMPARISON:  10/20/2019 FINDINGS: Cardiovascular: This is a technically adequate evaluation of the pulmonary  vasculature. There are no filling defects or pulmonary emboli. The pulmonary artery is normal in caliber. I do not see any abnormalities associated with the right pulmonary artery to explain the reported echocardiography findings. There is a trace pericardial effusion. Evaluation of the thoracic aorta is limited due to the timing of contrast bolus. No evidence of thoracic aortic aneurysm. Mediastinum/Nodes: No enlarged mediastinal, hilar, or axillary lymph nodes. Thyroid gland, trachea, and esophagus demonstrate no significant findings. Lungs/Pleura: There are small bilateral pleural effusions, volume estimated less than 500 cc each. Dependent lower lobe atelectasis. No airspace disease or pneumothorax. The central airways are patent. Upper Abdomen: Trace ascites is noted throughout the upper abdomen. Colonic interposition right upper quadrant. Postsurgical changes from previous gastric surgery. Musculoskeletal: The patient is cachectic. There are nondisplaced right posterolateral eighth and ninth rib fractures. There are nondisplaced left anterolateral seventh and eighth rib fractures. Reconstructed images demonstrate no additional findings. Review of the MIP images confirms the above findings. IMPRESSION: 1. No evidence of pulmonary embolus. I do not see any abnormalities associated with the right pulmonary artery to explain the reported echocardiography findings. 2. Small bilateral pleural effusions. 3. Trace ascites. 4. Nondisplaced bilateral rib fractures. Electronically Signed   By: Randa Ngo M.D.   On: 10/22/2019 00:37    Labs: BMET Recent Labs  Lab 10/17/19 1241 10/18/19 0013 10/19/19 0336 10/20/19 0618 10/21/19 0449 10/22/19 1129  NA 133*  137 135 136 137 136 137  K 3.1*  3.1* 3.5 3.5 3.8 3.7 3.6  CL 111  108 115* 117* 113* 112* 112*  CO2 13* 14* 14* 17* 17* 17*  GLUCOSE 82  79 88 95 92 95 173*  BUN 42*  38* 41* 37* 32* 31* 29*  CREATININE 3.74*  4.10* 3.52* 3.41* 3.06* 2.81*  2.52*  CALCIUM 7.4* 7.4* 7.3* 7.2* 7.2* 7.3*  PHOS  --  5.1*  --   --   --   --    CBC Recent Labs  Lab 10/17/19 1241 10/17/19 1311 10/18/19 0013 10/18/19 0013 10/19/19 0336 10/20/19 0618 10/21/19 0449 10/22/19 1129  WBC 18.8*   < > 11.7*   < > 7.8 6.2 6.0 6.9  NEUTROABS 17.7*  --  10.9*  --   --   --   --   --   HGB 8.6*  9.2*   < > 7.8*   < > 7.3* 8.9* 9.1* 8.9*  HCT 26.5*  27.0*   < > 25.2*   < > 23.1* 28.0* 29.0* 28.9*  MCV 89.5   < > 89.0   < > 90.6 88.1 88.4 90.3  PLT 69*   < > 61*   < > 66* 61* 66* 76*   < > = values in this interval not displayed.    Medications:    . aspirin EC  81 mg Oral Daily  . Chlorhexidine Gluconate Cloth  6 each Topical Daily  . doxycycline  100 mg Oral Q12H  . DULoxetine  30 mg Oral QHS  . DULoxetine  60 mg Oral Daily  . feeding supplement (ENSURE ENLIVE)  237 mL Oral TID BM  . gabapentin  300 mg Oral BID  . midodrine  2.5 mg Oral TID  . multivitamin with minerals  1 tablet Oral Daily  . pantoprazole  40 mg Oral BID AC  . pneumococcal 23 valent vaccine  0.5 mL Intramuscular  Tomorrow-1000  . sodium bicarbonate  1,950 mg Oral TID  . tamsulosin  0.4 mg Oral Daily    Gean Quint, MD [facility] 10/23/2019, 1:06 PM

## 2019-10-24 DIAGNOSIS — W19XXXA Unspecified fall, initial encounter: Secondary | ICD-10-CM | POA: Diagnosis not present

## 2019-10-24 DIAGNOSIS — N3000 Acute cystitis without hematuria: Secondary | ICD-10-CM

## 2019-10-24 DIAGNOSIS — I63413 Cerebral infarction due to embolism of bilateral middle cerebral arteries: Secondary | ICD-10-CM

## 2019-10-24 DIAGNOSIS — N184 Chronic kidney disease, stage 4 (severe): Secondary | ICD-10-CM | POA: Diagnosis not present

## 2019-10-24 DIAGNOSIS — G8921 Chronic pain due to trauma: Secondary | ICD-10-CM | POA: Diagnosis not present

## 2019-10-24 DIAGNOSIS — N390 Urinary tract infection, site not specified: Secondary | ICD-10-CM | POA: Diagnosis not present

## 2019-10-24 NOTE — Progress Notes (Signed)
PROGRESS NOTE    Chase [name]  PJK:[id number] DOB: [birth year] DOA: 10/17/2019 PCP: [name]   Brief Narrative:  HPI on 10/17/2019 by Dr. [name] Patient is a 54 year old male with history of GERD, PTSD, CKD stage IV, history of cervical radiculopathy, chronic pain syndrome, follows pain clinic presented with left arm and left leg weakness since this morning.  Patient reports that he ambulates by himself with the help of a cane.  He last went to bed normal.  However when he woke up this morning, he was unable to move his left side.  He denied any headache, any blurry vision.  He also felt numbness and tingling in his left side.  Denied any prior history of stroke.  Patient reports that he he lives alone and called the EMS.  Denied any chest pain, shortness of breath, abdominal pain, hematochezia or melena. Also reports having dysuria, difficulty urinating, also has an appointment at [facility] for the same  Interim history Patient admitted with acute left-sided weakness, concern for acute stroke falls acute UTI, thrombocytopenia, with a tick bite.  Currently on doxycycline and ceftriaxone. 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  -Noninvasive angiography showed no LVO, or other significant atherosclerosis, carotids without stenosis -Echocardiogram EF of 60 to 80%, grade 1 diastolic dysfunction -Neurology consulted and appreciated -Patient with history of atrial fibrillation, currently not an anticoagulation candidate -TPA not given because thrombocytopenia as well as unknown onset of symptoms -Continue aspirin 81 mg -LDL 26, hemoglobin A1c 5.4 -PT recommended CIR however not in network with [facility].  TOC consulted- patient will likely go to SNF. -Today patient complaining of left eye  lid heaviness and feels it difficult to keep it open. Have discussed with neurology, Dr. [name], who will see the patient.   Acute thrombocytopenia-suspect secondary to tickborne illness -Platelets currently stable, around 60,000 without any evidence of bleeding -Oncology/hematology and nephrology consulted and appreciated -TTP/pus relatively less likely than thrombocytopenia due to underlying infection (tickborne illness suspected, UTI) -Medication induced thrombocytopenia also unlikely -Abdominal ultrasound: Exam limited by overlying bowel gas.  Gallbladder and common bile duct not visualized.  Left hepatic lobe not visualized.  Liver has heterogeneous parenchymal pattern suggesting possibility of fatty infiltration or hepatocellular disease.  Spleen is normal size.  Increased echogenicity of both kidneys consistent with chronic medical renal disease.  Mild bilateral hydronephrosis. -LDH within normal range, Coombs test negative, reticulocytes not elevated.  ADAMTS13 33.9 -platelets appear to be improving, will continue to monitor CBC  Mass like Structure noted in right pulmonary artery -Noted on echocardiogram -Recommended CT PE study to exclude PE however patient does have CKD stage V at baseline -patient also noted to have elevated D-dimer, 3.19- obtained V/Q scan, which was normal -Question if patient needs a TEE- discussed this with neurology on 7/21, did not feel that TEE was indicated from stroke perspective  -Discussed obtaining CTA/PE study with nephrology.  Recommended pre and post hydration with normal saline at 100 cc/h for 6 hours prior, 6 hours post. -CTA PE study negative for PE.  Abnormalities noted are associated with the right pulmonary artery to explain echocardiogram findings.  Elevated troponin -Troponins in the 300s- likely demand ischemia  -Question whether is due to CVA versus process noted as above -No complaints of chest pain at this time -As noted, VQ as  above  Chronic kidney disease, stage IV with metabolic acidosis -Creatinine at baseline approximately 3-4.5 -Nephrology consulted and appreciated -Continue bicarb -Creatinine currntly 2.52 -Continue to monitor BMP  Urinary tract infection/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  Urology was consulted and patient was taken for cystoscopy showing large 300 cc posterior bladder diverticulum, purulent contents, and urine culture was showing multiple species .  Urology recommended outpatient follow-up with possible diverticulectomy.  He was treated with ceftriaxone and then cefdinir on discharge. -Patient was complaining of baseline urinary hesitancy with dysuria -Urine culture shows multiple species -Currently on ceftriaxone -Patient need to follow-up with the [facility] urology for diverticulectomy as an outpatient  BPH -Continue Flomax  Hypoalbuminemia/ Severe protein calorie malnutrition -As evidenced by severe loss of subcutaneous muscle mass and fat -Likely secondary to chronic illness, poor oral intake -BMI 18.5, albumin 1.6 -Nutrition consulted  PTSD/chronic pain syndrome -Continue duloxetine, gabapentin, tramadol  History of Hodgkin's lymphoma -Diagnosed in 2010 -Status post ABVD and radiation therapy which completed in 2011  Anemia of chronic disease/chronic B12 deficiency/history of gastric bypass -Hemoglobin was down to 7.3, and patient was given 1u PRBC -hemoglobin currently 8.9 -Continue B12 supplements -Work-up thus far shows a negative DAT, LDH, haptoglobin -Continue to monitor CBC  Stage I sacral pressure injury -Present on admission -Continue wound  care -Mattress has been replaced  DVT Prophylaxis  SCDs  Code Status: Full  Family Communication: None at bedside  Disposition Plan:  Status is: Inpatient  Remains inpatient appropriate because:Ongoing diagnostic testing needed not appropriate for outpatient work up, Unsafe d/c plan and Inpatient level of care appropriate due to severity of illness.  Pending laboratory studies this morning.  Also pending safe discharge given that patient has [employer] benefits.   Dispo:  Patient From: Home  Planned Disposition: [place]  Expected discharge date: 2-3 days  Medically stable for discharge: No  Consultants Neurology  Nephrology Hematology PMR/CIR  Procedures  Echocardiogram Abdominal US  Antibiotics   Anti-infectives (From admission, onward)   Start     Dose/Rate Route Frequency Ordered Stop   10/22/19 2200  doxycycline (VIBRA-TABS) tablet 100 mg     Discontinue     100 mg Oral Every 12 hours 10/22/19 0810 10/31/19 2359   10/18/19 1300  doxycycline (VIBRAMYCIN) 100 mg in sodium chloride 0.9 % 250 mL IVPB  Status:  Discontinued        100 mg 125 mL/hr over 120 Minutes Intravenous Every 12 hours 10/18/19 1157 10/22/19 0809   10/18/19 1300  cefTRIAXone (ROCEPHIN) 2 g in sodium chloride 0.9 % 100 mL IVPB     Discontinue     2 g 200 mL/hr over 30 Minutes Intravenous Every 24 hours 10/18/19 1157 10/24/19 2359   10/17/19 2000  doxycycline (VIBRA-TABS) tablet 100 mg  Status:  Discontinued        100 mg Oral Every 12 hours 10/17/19 1832 10/18/19 1157   10/17/19 1415  cefTRIAXone (ROCEPHIN) 2 g in sodium chloride 0.9 % 100 mL IVPB        2 g 200 mL/hr over 30 Minutes Intravenous  Once 10/17/19 1408 10/17/19 1611      Subjective:   Chase [name] seen and examined today.  Patient states that he is having a hard time keeping his left eyelid open.  States that started late last night.  Denies any itching or burning pain with it.  States that it just feels better closed.   Denies current chest pain shortness of breath, abdominal pain, nausea or vomiting, diarrhea or constipation, dizziness or headache. Objective:   Vitals:   10/23/19 2305 10/23/19 2313 10/24/19 0308 10/24/19 0757  BP: 124/79  128/81 128/81  Pulse: 57 61 61 58  Resp: (!) 10 20 17 16   Temp: 97.6 F (36.4 C)  97.7 F (36.5 C) 98.1 F (36.7 C)  TempSrc: Axillary  Oral Oral  SpO2: 99% 99% 98% 100%  Weight:      Height:        Intake/Output Summary (Last 24 hours) at 10/24/2019 1134 Last data filed at 10/24/2019 0400 Gross per 24 hour  Intake 240 ml  Output 1775 ml  Net -1535 ml   Filed Weights   10/17/19 1216  Weight: 55.3 kg   Exam  General: Well developed, chronically ill-appearing, NAD  HEENT: NCAT, PERRLA, EOMI, Anicteic Sclera, mucous membranes moist.   Cardiovascular: S1 S2 auscultated, no rubs, murmurs or gallops. Regular rate and rhythm.  Respiratory: Clear to auscultation bilaterally with equal chest rise  Abdomen: Soft, nontender, nondistended, + bowel sounds  Extremities: warm dry without cyanosis clubbing or edema.  Petechiae on lower extremities bilaterally  Neuro: AAOx3, left-sided weakness, Otherwise nonfocal  Psych: appropriate mood and affect  Data Reviewed: I have personally reviewed following labs and imaging studies  CBC: Recent Labs  Lab 10/17/19 1241 10/17/19 1311 10/18/19 0013 10/19/19 0336 10/20/19 0618 10/21/19 0449 10/22/19 1129  WBC 18.8*   < > 11.7* 7.8 6.2 6.0 6.9  NEUTROABS 17.7*  --  10.9*  --   --   --   --   HGB 8.6*   9.2*   < > 7.8* 7.3* 8.9* 9.1* 8.9*  HCT 26.5*   27.0*   < > 25.2* 23.1* 28.0* 29.0* 28.9*  MCV 89.5   < > 89.0 90.6 88.1 88.4 90.3  PLT 69*   < > 61* 66* 61* 66* 76*   < > = values in this interval not displayed.   Basic Metabolic Panel: Recent Labs  Lab 10/18/19 0013 10/19/19 0336 10/20/19 0618 10/21/19 0449 10/22/19 1129  NA 135 136 137 136 137  K 3.5 3.5 3.8 3.7 3.6  CL 115* 117* 113* 112* 112*   CO2 14* 14* 17* 17* 17*  GLUCOSE 88 95 92 95 173*  BUN 41* 37* 32* 31* 29*  CREATININE 3.52* 3.41* 3.06* 2.81* 2.52*  CALCIUM 7.4* 7.3* 7.2* 7.2* 7.3*  PHOS 5.1*  --   --   --   --    GFR: Estimated Creatinine Clearance: 25.9 mL/min (A) (by C-G formula based on SCr of 2.52 mg/dL (H)). Liver Function Tests: Recent Labs  Lab 10/17/19 1241 10/18/19 0013 10/19/19 0336 10/20/19 0618  AST 18  --  24 26  ALT 18  --  19 19  ALKPHOS 96  --  86 94  BILITOT 0.4  --  0.7 0.5  PROT 5.6*  --  4.6* 5.0*  ALBUMIN 2.0* 1.6* 1.6* 1.6*   No results for input(s): LIPASE, AMYLASE in the last 168 hours. No results for input(s): AMMONIA in the last 168 hours. Coagulation Profile: Recent Labs  Lab 10/17/19 1241 10/17/19 1311  INR 1.5* 1.6*   Cardiac Enzymes: No results for input(s):  CKTOTAL, CKMB, CKMBINDEX, TROPONINI in the last 168 hours. BNP (last 3 results) No results for input(s): PROBNP in the last 8760 hours. HbA1C: No results for input(s): HGBA1C in the last 72 hours. CBG: Recent Labs  Lab 10/17/19 1212  GLUCAP 64*   Lipid Profile: No results for input(s): CHOL, HDL, LDLCALC, TRIG, CHOLHDL, LDLDIRECT in the last 72 hours. Thyroid Function Tests: No results for input(s): TSH, T4TOTAL, FREET4, T3FREE, THYROIDAB in the last 72 hours. Anemia Panel: No results for input(s): VITAMINB12, FOLATE, FERRITIN, TIBC, IRON, RETICCTPCT in the last 72 hours. Urine analysis:    Component Value Date/Time   COLORURINE YELLOW 10/17/2019 1238   APPEARANCEUR CLOUDY (A) 10/17/2019 1238   LABSPEC 1.008 10/17/2019 1238   PHURINE 7.0 10/17/2019 1238   GLUCOSEU NEGATIVE 10/17/2019 1238   HGBUR LARGE (A) 10/17/2019 1238   BILIRUBINUR NEGATIVE 10/17/2019 1238   KETONESUR NEGATIVE 10/17/2019 1238   PROTEINUR 30 (A) 10/17/2019 1238   NITRITE NEGATIVE 10/17/2019 1238   LEUKOCYTESUR LARGE (A) 10/17/2019 1238   Sepsis Labs: @LABRCNTIP (procalcitonin:4,lacticidven:4)  ) Recent Results (from the  past 240 hour(s))  SARS Coronavirus 2 by RT PCR (hospital order, performed in [place] hospital lab) Nasopharyngeal Nasopharyngeal Swab     Status: None   Collection Time: 10/17/19  1:14 PM   Specimen: Nasopharyngeal Swab  Result Value Ref Range Status   SARS Coronavirus 2 NEGATIVE NEGATIVE Final    Comment: (NOTE) SARS-CoV-2 target nucleic acids are NOT DETECTED.  The SARS-CoV-2 RNA is generally detectable in upper and lower respiratory specimens during the acute phase of infection. The lowest concentration of SARS-CoV-2 viral copies this assay can detect is 250 copies / mL. A negative result does not preclude SARS-CoV-2 infection and should not be used as the sole basis for treatment or other patient management decisions.  A negative result may occur with improper specimen collection / handling, submission of specimen other than nasopharyngeal swab, presence of viral mutation(s) within the areas targeted by this assay, and inadequate number of viral copies (<250 copies / mL). A negative result must be combined with clinical observations, patient history, and epidemiological information.  Fact Sheet for Patients:   StrictlyIdeas.no  Fact Sheet for Healthcare Providers: BankingDealers.co.za  This test is not yet approved or  cleared by the [place] FDA and has been authorized for detection and/or diagnosis of SARS-CoV-2 by FDA under an Emergency Use Authorization (EUA).  This EUA will remain in effect (meaning this test can be used) for the duration of the COVID-19 declaration under Section 564(b)(1) of the Act, 21 U.S.C. section 360bbb-3(b)(1), unless the authorization is terminated or revoked sooner.  Performed at [facility], [address] [place], [place] [address]   Blood culture (routine x 2)     Status: None   Collection Time: 10/17/19  2:26 PM   Specimen: Left Antecubital; Blood  Result Value Ref Range Status    Specimen Description   Final    LEFT ANTECUBITAL BOTTLES DRAWN AEROBIC AND ANAEROBIC   Special Requests Blood Culture adequate volume  Final   Culture   Final    NO GROWTH 5 DAYS Performed at [facility], [address], [place], [place] [address]    Report Status 10/22/2019 FINAL  Final  Urine culture     Status: Abnormal   Collection Time: 10/17/19  2:37 PM   Specimen: Urine, Catheterized  Result Value Ref Range Status   Specimen Description   Final    URINE, CATHETERIZED Performed at  [facility], [address], [place], [place] [address]    Special Requests   Final    NONE Performed at [facility], [address], [place], [place] [address]    Culture MULTIPLE SPECIES PRESENT, SUGGEST RECOLLECTION (A)  Final   Report Status 10/19/2019 FINAL  Final  Blood culture (routine x 2)     Status: None   Collection Time: 10/17/19  2:41 PM   Specimen: Right Antecubital; Blood  Result Value Ref Range Status   Specimen Description   Final    RIGHT ANTECUBITAL BOTTLES DRAWN AEROBIC AND ANAEROBIC   Special Requests Blood Culture adequate volume  Final   Culture   Final    NO GROWTH 5 DAYS Performed at [facility], [address], [place],  [address]    Report Status 10/22/2019 FINAL  Final      Radiology Studies: No results found.   Scheduled Meds:  aspirin EC  81 mg Oral Daily   Chlorhexidine Gluconate Cloth  6 each Topical Daily   doxycycline  100 mg Oral Q12H   DULoxetine  30 mg Oral QHS   DULoxetine  60 mg Oral Daily   feeding supplement (ENSURE ENLIVE)  237 mL Oral TID BM   gabapentin  300 mg Oral BID   midodrine  2.5 mg Oral TID   multivitamin with minerals  1 tablet Oral Daily   pantoprazole  40 mg Oral BID AC   pneumococcal 23 valent vaccine  0.5 mL Intramuscular Tomorrow-1000   sodium bicarbonate  1,950 mg Oral TID   tamsulosin  0.4 mg Oral Daily   Continuous Infusions:  cefTRIAXone (ROCEPHIN)  IV 2 g (10/23/19 1347)     LOS: 7 days    Time Spent in minutes  30 minutes  Yalexa Blust D.O. on 10/24/2019 at 11:34 AM  Between 7am to 7pm - Please see pager noted on [email or web address]  After 7pm go to [email or web address]  And look for the night coverage person covering for me after hours  [facility] Office  [phone]

## 2019-10-24 NOTE — Progress Notes (Signed)
STROKE TEAM PROGRESS NOTE   INTERVAL HISTORY Pt son at bedside. Neurology was called back as pt complains of left eyelid heavy and not able to open up wide. However, during the encounter, with distraction, left eyelid able to open with similar size of the left. Also giveaway weakness on the left and complains of left hemiparesthesia   Vitals:   10/23/19 2313 10/24/19 0308 10/24/19 0757 10/24/19 1218  BP:  128/81 128/81 127/80  Pulse: 61 61 58 67  Resp: 20 17 16 16   Temp:  97.7 F (36.5 C) 98.1 F (36.7 C) 98 F (36.7 C)  TempSrc:  Oral Oral Oral  SpO2: 99% 98% 100% 100%  Weight:      Height:       CBC:  Recent Labs  Lab 10/17/19 1241 10/17/19 1311 10/18/19 0013 10/19/19 0336 10/21/19 0449 10/22/19 1129  WBC 18.8*   < > 11.7*   < > 6.0 6.9  NEUTROABS 17.7*  --  10.9*  --   --   --   HGB 8.6*  9.2*   < > 7.8*   < > 9.1* 8.9*  HCT 26.5*  27.0*   < > 25.2*   < > 29.0* 28.9*  MCV 89.5   < > 89.0   < > 88.4 90.3  PLT 69*   < > 61*   < > 66* 76*   < > = values in this interval not displayed.   Basic Metabolic Panel:  Recent Labs  Lab 10/18/19 0013 10/19/19 0336 10/21/19 0449 10/22/19 1129  NA 135   < > 136 137  K 3.5   < > 3.7 3.6  CL 115*   < > 112* 112*  CO2 14*   < > 17* 17*  GLUCOSE 88   < > 95 173*  BUN 41*   < > 31* 29*  CREATININE 3.52*   < > 2.81* 2.52*  CALCIUM 7.4*   < > 7.2* 7.3*  PHOS 5.1*  --   --   --    < > = values in this interval not displayed.   Lipid Panel:  Recent Labs  Lab 10/18/19 0013  CHOL 60  TRIG 67  HDL 21*  CHOLHDL 2.9  VLDL 13  LDLCALC 26   HgbA1c:  Recent Labs  Lab 10/18/19 0013  HGBA1C 5.4   Urine Drug Screen:  No results for input(s): LABOPIA, COCAINSCRNUR, LABBENZ, AMPHETMU, THCU, LABBARB in the last 168 hours.  Alcohol Level  Recent Labs  Lab 10/17/19 1241  ETH <10    IMAGING  Transthoracic Echocardiogram  10/19/19 IMPRESSIONS  1. There is a mass like structure noted in the right pulmonary artery.   This is seen on the suprasternal notch views. Unable to exclude pulmonary  embolism. Given elevated troponin, would consider CT PE study to exclude  PE.  2. Left ventricular ejection fraction, by estimation, is 60 to 65%. The  left ventricle has normal function. The left ventricle has no regional  wall motion abnormalities. Left ventricular diastolic parameters are  consistent with Grade I diastolic  dysfunction (impaired relaxation).  3. Right ventricular systolic function is normal. The right ventricular  size is normal. Tricuspid regurgitation signal is inadequate for assessing  PA pressure.  4. There is a moderate pericardial effusion up to 1.5 cm over the  anterior RV and LV apex. There is no RA/RV diastolic collapse. There is no  respiratory variation in MV inflow. The IVC is now well visualized.  There  are no overt echocardiographic signs  of tamponade. Moderate pericardial effusion. The pericardial effusion is  anterior to the right ventricle and surrounding the apex. There is no  evidence of cardiac tamponade.  5. The mitral valve is grossly normal. Trivial mitral valve  regurgitation. No evidence of mitral stenosis.  6. The aortic valve is tricuspid. Aortic valve regurgitation is not  visualized. No aortic stenosis is present.   CTA Chest 10/22/19 IMPRESSION: 1. No evidence of pulmonary embolus. I do not see any abnormalities associated with the right pulmonary artery to explain the reported echocardiography findings. 2. Small bilateral pleural effusions. 3. Trace ascites. 4. Nondisplaced bilateral rib fractures.  MRI Brain WO Contrast 10/18/2019 IMPRESSION: Small bilateral acute cerebral and cerebellar infarcts suggesting a central embolic source.  CT Head WO Contrast 10/18/19 IMPRESSION: Subtle evidence of cortical infarction in the right occipital region. No mass effect or hemorrhage. None of the other acute infarctions visible by MRI are visible on CT.  Generalized volume loss, somewhat premature for age.   PHYSICAL EXAM Pleasant frail malnourished looking middle-aged Caucasian male not in distress. . Afebrile. Head is nontraumatic. Neck is supple without bruit.    Cardiac exam no murmur or gallop. Lungs are clear to auscultation. Distal pulses are well felt.  Neurological Exam :  Awake alert oriented to time place and person. No aphasia, able to name and repeat, following simple commands. Visual field full, no gaze preference. Extraocular movements are full range without nystagmus. Left ptosis can be reversed with distraction.  Face is symmetric without weakness. Tongue midline.  Motor system exam shows left giveaway weakness with left upper extremity to 3-/5 strength in left lower extremity 2/5 strength. Good purposeful antigravity movements in the right side.  Reflexes are symmetric brisk bilaterally.  Toes downgoing.  Gait not tested.   ASSESSMENT/PLAN [name] is a 54 y.o. male with history of  GERD, PTSD, CKD stage IV, history of cervical radiculopathy, chronic pain syndrome, follows pain clinic presenting to [facility]  with L sided weakness.   Stroke:   B anterior and posterior circulation infarcts, embolic secondary to unknown source  CT head No acute abnormality. ASPECTS 10.     CTA head & neck no LVO, Unremarkable   MRI  Small B cerebral and cerebellar infarcts  2D Echo EF 60-65%  LDL 26  HgbA1c 5.4  VTE prophylaxis - SCDs   No antithrombotic prior to admission (hx GIB in past), now on aspirin 81 mg daily given his anemia, thrombocytopenia and hx of GIB. He was deemed by Dr. Leonie Man not a good candidate for long term AC at this time. Will follow up with Dr. Leonie Man at [facility] to consider [facility] or embolic work up once he becomes candidate  Therapy recommendations:  CIR   Disposition:  pending - lived home alone PTA  Bladder diverticulum  B/l nephrohydrosis   [facility]  UTI w/  leukocytosis on rocephin  CKD stage IV w/ metabolic acidosis  Other Stroke Risk Factors  Denies ETOH use, alcohol level <10  Denies substance abuse, UDS neg  Coronary artery disease s/p recent MI  Other Active Problems  GERD on PPI  PTSD  Hx cervical radiculopathy  Chronic pain syndrome followed at pain clinic  Hx chronic anemia, likely anemia of chronic dz, Hgb 8.6->7.8->8.9  Falls w/ generalized debility, multiple bruises   Elevated troponins w/ hx CAD  Thrombocytopenia, PLT 69-68-61->76  Tick bite, removed in  ED, on doxy   Hypokalemia  Hospital day # 7  Neurology will sign off. Please call with questions. Pt will follow up with stroke clinic Dr. Leonie Man at [facility] in about 4 weeks. Thanks for the consult.  Rosalin Hawking, MD PhD Stroke Neurology 10/24/2019 6:13 PM    To contact Stroke Continuity provider, please refer to [email or web address] After hours, contact General Neurology

## 2019-10-25 DIAGNOSIS — N184 Chronic kidney disease, stage 4 (severe): Secondary | ICD-10-CM | POA: Diagnosis not present

## 2019-10-25 DIAGNOSIS — W19XXXA Unspecified fall, initial encounter: Secondary | ICD-10-CM | POA: Diagnosis not present

## 2019-10-25 DIAGNOSIS — N390 Urinary tract infection, site not specified: Secondary | ICD-10-CM | POA: Diagnosis not present

## 2019-10-25 DIAGNOSIS — G8921 Chronic pain due to trauma: Secondary | ICD-10-CM | POA: Diagnosis not present

## 2019-10-25 LAB — CBC
HCT: 28.4 % — ABNORMAL LOW (ref 39.0–52.0)
Hemoglobin: 8.9 g/dL — ABNORMAL LOW (ref 13.0–17.0)
MCH: 28.2 pg (ref 26.0–34.0)
MCHC: 31.3 g/dL (ref 30.0–36.0)
MCV: 89.9 fL (ref 80.0–100.0)
Platelets: 80 10*3/uL — ABNORMAL LOW (ref 150–400)
RBC: 3.16 MIL/uL — ABNORMAL LOW (ref 4.22–5.81)
RDW: 18 % — ABNORMAL HIGH (ref 11.5–15.5)
WBC: 6.9 10*3/uL (ref 4.0–10.5)
nRBC: 0 % (ref 0.0–0.2)

## 2019-10-25 LAB — BASIC METABOLIC PANEL
Anion gap: 9 (ref 5–15)
BUN: 30 mg/dL — ABNORMAL HIGH (ref 6–20)
CO2: 22 mmol/L (ref 22–32)
Calcium: 7.4 mg/dL — ABNORMAL LOW (ref 8.9–10.3)
Chloride: 103 mmol/L (ref 98–111)
Creatinine, Ser: 2.35 mg/dL — ABNORMAL HIGH (ref 0.61–1.24)
GFR calc Af Amer: 35 mL/min — ABNORMAL LOW (ref >60)
GFR calc non Af Amer: 30 mL/min — ABNORMAL LOW (ref >60)
Glucose, Bld: 144 mg/dL — ABNORMAL HIGH (ref 70–99)
Potassium: 3.4 mmol/L — ABNORMAL LOW (ref 3.5–5.1)
Sodium: 134 mmol/L — ABNORMAL LOW (ref 135–145)

## 2019-10-25 LAB — SARS CORONAVIRUS 2 (TAT 6-24 HRS): SARS Coronavirus 2: NEGATIVE

## 2019-10-25 LAB — MAGNESIUM: Magnesium: 1.5 mg/dL — ABNORMAL LOW (ref 1.7–2.4)

## 2019-10-25 MED ORDER — MAGNESIUM SULFATE 2 GM/50ML IV SOLN
2.0000 g | Freq: Once | INTRAVENOUS | Status: AC
  Administered 2019-10-25: 2 g via INTRAVENOUS
  Filled 2019-10-25: qty 50

## 2019-10-25 MED ORDER — POTASSIUM CHLORIDE CRYS ER 20 MEQ PO TBCR
40.0000 meq | EXTENDED_RELEASE_TABLET | Freq: Once | ORAL | Status: AC
  Administered 2019-10-25: 40 meq via ORAL
  Filled 2019-10-25: qty 2

## 2019-10-25 NOTE — Progress Notes (Signed)
PROGRESS NOTE    Chase [name]  UVO:[id number] DOB: [birth year] DOA: 10/17/2019 PCP: [name]   Brief Narrative:  HPI on 10/17/2019 by Dr. [name] Patient is a 54 year old male with history of GERD, PTSD, CKD stage IV, history of cervical radiculopathy, chronic pain syndrome, follows pain clinic presented with left arm and left leg weakness since this morning.  Patient reports that he ambulates by himself with the help of a cane.  He last went to bed normal.  However when he woke up this morning, he was unable to move his left side.  He denied any headache, any blurry vision.  He also felt numbness and tingling in his left side.  Denied any prior history of stroke.  Patient reports that he he lives alone and called the EMS.  Denied any chest pain, shortness of breath, abdominal pain, hematochezia or melena. Also reports having dysuria, difficulty urinating, also has an appointment at [facility] for the same  Interim history Patient admitted with acute left-sided weakness, concern for acute stroke falls acute UTI, thrombocytopenia, with a tick bite.  Currently on doxycycline and ceftriaxone. 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  -Noninvasive angiography showed no LVO, or other significant atherosclerosis, carotids without stenosis -Echocardiogram EF of 60 to 74%, grade 1 diastolic dysfunction -Neurology consulted and appreciated -Patient with history of atrial fibrillation, currently not an anticoagulation candidate -TPA not given because thrombocytopenia as well as unknown onset of symptoms -Continue aspirin 81 mg -LDL 26, hemoglobin A1c 5.4 -PT recommended CIR however not in network with [facility].  TOC consulted- patient will likely go to SNF. -Patient stated that he was having left  eyelid heaviness and felt it difficult to keep it open.  Discussed with Dr. [name], feels that patient is able to control this. -Today, he is able to keep his eyes open.  Acute thrombocytopenia-suspect secondary to tickborne illness -Platelets currently stable, around 60,000 without any evidence of bleeding -Oncology/hematology and nephrology consulted and appreciated -TTP/pus relatively less likely than thrombocytopenia due to underlying infection (tickborne illness suspected, UTI) -Medication induced thrombocytopenia also unlikely -Abdominal ultrasound: Exam limited by overlying bowel gas.  Gallbladder and common bile duct not visualized.  Left hepatic lobe not visualized.  Liver has heterogeneous parenchymal pattern suggesting possibility of fatty infiltration or hepatocellular disease.  Spleen is normal size.  Increased echogenicity of both kidneys consistent with chronic medical renal disease.  Mild bilateral hydronephrosis. -LDH within normal range, Coombs test negative, reticulocytes not elevated.  ADAMTS13 33.9 -platelets appear to be improving, up to 80 today -Continue to monitor CBC  Mass like Structure noted in right pulmonary artery -Noted on echocardiogram -Recommended CT PE study to exclude PE however patient does have CKD stage V at baseline -patient also noted to have elevated D-dimer, 3.19- obtained V/Q scan, which was normal -Question if patient needs a TEE- discussed this with neurology on 7/21, did not feel that TEE was indicated from stroke perspective  -Discussed obtaining CTA/PE study with nephrology.  Recommended pre and post hydration with normal saline at 100 cc/h for 6 hours prior, 6 hours post. -CTA PE study negative for PE.  Abnormalities noted are associated with the right pulmonary artery to explain echocardiogram findings.  Elevated troponin -Troponins in the 300s- likely demand ischemia  -Question whether is due to CVA versus process noted as above -  No complaints  of chest pain at this time -As noted, VQ as above  Chronic kidney disease, stage IV with metabolic acidosis -Creatinine at baseline approximately 3-4.5 -Nephrology consulted and appreciated -Continue bicarb -Creatinine currntly 2.35 -Continue to monitor BMP  Urinary tract infection/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  Urology was consulted and patient was taken for cystoscopy showing large 300 cc posterior bladder diverticulum, purulent contents, and urine culture was showing multiple species .  Urology recommended outpatient follow-up with possible diverticulectomy.  He was treated with ceftriaxone and then cefdinir on discharge. -Patient was complaining of baseline urinary hesitancy with dysuria -Urine culture shows multiple species -Currently on ceftriaxone -Patient need to follow-up with the [facility] urology for diverticulectomy as an outpatient  BPH -Continue Flomax  Hypoalbuminemia/ Severe protein calorie malnutrition -As evidenced by severe loss of subcutaneous muscle mass and fat -Likely secondary to chronic illness, poor oral intake -BMI 18.5, albumin 1.6 -Nutrition consulted  PTSD/chronic pain syndrome -Continue duloxetine, gabapentin, tramadol -currently on hydrocodone  History of Hodgkin's lymphoma -Diagnosed in 2010 -Status post ABVD and radiation therapy which completed in 2011  Anemia of chronic disease/chronic B12 deficiency/history of gastric bypass -Hemoglobin was down to 7.3, and patient was given 1u PRBC -hemoglobin currently 8.9 -Continue B12 supplements -Work-up thus far shows a negative DAT, LDH, haptoglobin -Continue to monitor CBC  Stage I  sacral pressure injury -Present on admission -Continue wound care -Mattress has been replaced  Hypokalemia/hypomagnesemia -Will replace both potassium and magnesium and continue to monitor  DVT Prophylaxis  SCDs  Code Status: Full  Family Communication: None at bedside  Disposition Plan:  Status is: Inpatient  Remains inpatient appropriate because:Ongoing diagnostic testing needed not appropriate for outpatient work up, Unsafe d/c plan and Inpatient level of care appropriate due to severity of illness.  Pending laboratory studies this morning.  Also pending safe discharge given that patient has [facility] benefits.   Dispo:  Patient From: Home  Planned Disposition: [place]  Expected discharge date: 2-3 days  Medically stable for discharge: No  Consultants Neurology  Nephrology Hematology PMR/CIR  Procedures  Echocardiogram Abdominal US  Antibiotics   Anti-infectives (From admission, onward)   Start     Dose/Rate Route Frequency Ordered Stop   10/22/19 2200  doxycycline (VIBRA-TABS) tablet 100 mg     Discontinue     100 mg Oral Every 12 hours 10/22/19 0810 10/31/19 2359   10/18/19 1300  doxycycline (VIBRAMYCIN) 100 mg in sodium chloride 0.9 % 250 mL IVPB  Status:  Discontinued        100 mg 125 mL/hr over 120 Minutes Intravenous Every 12 hours 10/18/19 1157 10/22/19 0809   10/18/19 1300  cefTRIAXone (ROCEPHIN) 2 g in sodium chloride 0.9 % 100 mL IVPB        2 g 200 mL/hr over 30 Minutes Intravenous Every 24 hours 10/18/19 1157 10/24/19 1344   10/17/19 2000  doxycycline (VIBRA-TABS) tablet 100 mg  Status:  Discontinued        100 mg Oral Every 12 hours 10/17/19 1832 10/18/19 1157   10/17/19 1415  cefTRIAXone (ROCEPHIN) 2 g in sodium chloride 0.9 % 100 mL IVPB        2 g  200 mL/hr over 30 Minutes Intravenous  Once 10/17/19 1408 10/17/19 1611      Subjective:   Chase [name] seen and examined today.  Patient states he had a rough night last night was  unable to rest.  Currently he denies any chest pain or shortness of breath, abdominal pain, nausea or vomiting, dizziness or headache.  Does state that his pain management doctor was going to increase his pain medication from tramadol to hydrocodone.  Patient states that he has pain everywhere, but cannot specify the type of pain. Objective:   Vitals:   10/25/19 0116 10/25/19 0309 10/25/19 0529 10/25/19 0851  BP: 113/85 111/75 112/68 111/65  Pulse:  89  63  Resp: 15 14  20   Temp:  98.7 F (37.1 C)  97.8 F (36.6 C)  TempSrc:  Oral  Oral  SpO2:  97%  95%  Weight:      Height:        Intake/Output Summary (Last 24 hours) at 10/25/2019 1107 Last data filed at 10/25/2019 6269 Gross per 24 hour  Intake 720.9 ml  Output 2600 ml  Net -1879.1 ml   Filed Weights   10/17/19 1216  Weight: 55.3 kg   Exam  General: Well developed, chronically ill-appearing, thin, NAD  HEENT: NCAT, mucous membranes moist.   Cardiovascular: S1 S2 auscultated, RRR  Respiratory: Clear to auscultation bilaterally  Abdomen: Soft, nontender, nondistended, + bowel sounds  Extremities: warm dry without cyanosis clubbing or edema  Neuro: AAOx3, left-sided weakness, otherwise nonfocal  Data Reviewed: I have personally reviewed following labs and imaging studies  CBC: Recent Labs  Lab 10/19/19 0336 10/20/19 0618 10/21/19 0449 10/22/19 1129 10/25/19 0251  WBC 7.8 6.2 6.0 6.9 6.9  HGB 7.3* 8.9* 9.1* 8.9* 8.9*  HCT 23.1* 28.0* 29.0* 28.9* 28.4*  MCV 90.6 88.1 88.4 90.3 89.9  PLT 66* 61* 66* 76* 80*   Basic Metabolic Panel: Recent Labs  Lab 10/19/19 0336 10/20/19 0618 10/21/19 0449 10/22/19 1129 10/25/19 0251  NA 136 137 136 137 134*  K 3.5 3.8 3.7 3.6 3.4*  CL 117* 113* 112* 112* 103  CO2 14* 17* 17* 17* 22  GLUCOSE 95 92 95 173* 144*  BUN 37* 32* 31* 29* 30*  CREATININE 3.41* 3.06* 2.81* 2.52* 2.35*  CALCIUM 7.3* 7.2* 7.2* 7.3* 7.4*  MG  --   --   --   --  1.5*   GFR: Estimated  Creatinine Clearance: 27.8 mL/min (A) (by C-G formula based on SCr of 2.35 mg/dL (H)). Liver Function Tests: Recent Labs  Lab 10/19/19 0336 10/20/19 0618  AST 24 26  ALT 19 19  ALKPHOS 86 94  BILITOT 0.7 0.5  PROT 4.6* 5.0*  ALBUMIN 1.6* 1.6*   No results for input(s): LIPASE, AMYLASE in the last 168 hours. No results for input(s): AMMONIA in the last 168 hours. Coagulation Profile: No results for input(s): INR, PROTIME in the last 168 hours. Cardiac Enzymes: No results for input(s): CKTOTAL, CKMB, CKMBINDEX, TROPONINI in the last 168 hours. BNP (last 3 results) No results for input(s): PROBNP in the last 8760 hours. HbA1C: No results for input(s): HGBA1C in the last 72 hours. CBG: No results for input(s): GLUCAP in the last 168 hours. Lipid Profile: No results for input(s): CHOL, HDL, LDLCALC, TRIG, CHOLHDL, LDLDIRECT in the last 72 hours. Thyroid Function Tests: No results for input(s): TSH, T4TOTAL, FREET4, T3FREE, THYROIDAB in the last 72 hours. Anemia Panel: No results for input(s): VITAMINB12, FOLATE, FERRITIN,  TIBC, IRON, RETICCTPCT in the last 72 hours. Urine analysis:    Component Value Date/Time   COLORURINE YELLOW 10/17/2019 1238   APPEARANCEUR CLOUDY (A) 10/17/2019 1238   LABSPEC 1.008 10/17/2019 1238   PHURINE 7.0 10/17/2019 1238   GLUCOSEU NEGATIVE 10/17/2019 1238   HGBUR LARGE (A) 10/17/2019 1238   BILIRUBINUR NEGATIVE 10/17/2019 1238   KETONESUR NEGATIVE 10/17/2019 1238   PROTEINUR 30 (A) 10/17/2019 1238   NITRITE NEGATIVE 10/17/2019 1238   LEUKOCYTESUR LARGE (A) 10/17/2019 1238   Sepsis Labs: @LABRCNTIP (procalcitonin:4,lacticidven:4)  ) Recent Results (from the past 240 hour(s))  SARS Coronavirus 2 by RT PCR (hospital order, performed in [place] hospital lab) Nasopharyngeal Nasopharyngeal Swab     Status: None   Collection Time: 10/17/19  1:14 PM   Specimen: Nasopharyngeal Swab  Result Value Ref Range Status   SARS Coronavirus 2 NEGATIVE  NEGATIVE Final    Comment: (NOTE) SARS-CoV-2 target nucleic acids are NOT DETECTED.  The SARS-CoV-2 RNA is generally detectable in upper and lower respiratory specimens during the acute phase of infection. The lowest concentration of SARS-CoV-2 viral copies this assay can detect is 250 copies / mL. A negative result does not preclude SARS-CoV-2 infection and should not be used as the sole basis for treatment or other patient management decisions.  A negative result may occur with improper specimen collection / handling, submission of specimen other than nasopharyngeal swab, presence of viral mutation(s) within the areas targeted by this assay, and inadequate number of viral copies (<250 copies / mL). A negative result must be combined with clinical observations, patient history, and epidemiological information.  Fact Sheet for Patients:   StrictlyIdeas.no  Fact Sheet for Healthcare Providers: BankingDealers.co.za  This test is not yet approved or  cleared by the [place] FDA and has been authorized for detection and/or diagnosis of SARS-CoV-2 by FDA under an Emergency Use Authorization (EUA).  This EUA will remain in effect (meaning this test can be used) for the duration of the COVID-19 declaration under Section 564(b)(1) of the Act, 21 U.S.C. section 360bbb-3(b)(1), unless the authorization is terminated or revoked sooner.  Performed at [facility], [address], [place], [place] [address]   Blood culture (routine x 2)     Status: None   Collection Time: 10/17/19  2:26 PM   Specimen: Left Antecubital; Blood  Result Value Ref Range Status   Specimen Description   Final    LEFT ANTECUBITAL BOTTLES DRAWN AEROBIC AND ANAEROBIC   Special Requests Blood Culture adequate volume  Final   Culture   Final    NO GROWTH 5 DAYS Performed at [facility], [address], [place], [place] [address]    Report Status 10/22/2019 FINAL   Final  Urine culture     Status: Abnormal   Collection Time: 10/17/19  2:37 PM   Specimen: Urine, Catheterized  Result Value Ref Range Status   Specimen Description   Final    URINE, CATHETERIZED Performed at [facility], [address], [place], [place] [address]    Special Requests   Final    NONE Performed at [facility], [address], [place], [place] [address]    Culture MULTIPLE SPECIES PRESENT, SUGGEST RECOLLECTION (A)  Final   Report Status 10/19/2019 FINAL  Final  Blood culture (routine x 2)     Status: None   Collection Time: 10/17/19  2:41 PM   Specimen: Right Antecubital; Blood  Result Value Ref Range Status   Specimen Description  Final    RIGHT ANTECUBITAL BOTTLES DRAWN AEROBIC AND ANAEROBIC   Special Requests Blood Culture adequate volume  Final   Culture   Final    NO GROWTH 5 DAYS Performed at [facility], [address], [place], [place] [address]    Report Status 10/22/2019 FINAL  Final      Radiology Studies: No results found.   Scheduled Meds:  aspirin EC  81 mg Oral Daily   Chlorhexidine Gluconate Cloth  6 each Topical Daily   doxycycline  100 mg Oral Q12H   DULoxetine  30 mg Oral QHS   DULoxetine  60 mg Oral Daily   feeding supplement (ENSURE ENLIVE)  237 mL Oral TID BM   gabapentin  300 mg Oral BID   midodrine  2.5 mg Oral TID   multivitamin with minerals  1 tablet Oral Daily   pantoprazole  40 mg Oral BID AC   pneumococcal 23 valent vaccine  0.5 mL Intramuscular Tomorrow-1000   sodium bicarbonate  1,950 mg Oral TID   tamsulosin  0.4 mg Oral Daily   Continuous Infusions:    LOS: 8 days   Time Spent in minutes  30 minutes  Shonda Mandarino D.O. on 10/25/2019 at 11:07 AM  Between 7am to 7pm - Please see pager noted on [email or web address]  After 7pm go to [email or web address]  And look for the night coverage person covering for me after hours  [facility] Office  [phone]

## 2019-10-25 NOTE — Progress Notes (Signed)
Occupational Therapy Treatment Patient Details Name: Chase [name] MRN: [id number] DOB: 09-11-1965 Today's Date: 10/25/2019    History of present illness Pt is a 54 y.o. male with PMH of cervical post-laminectomy syndrome, arthalgia of multiple sites, hodgkin's disease, PTSD, CKD, chronic pain syndrome, and anemia presenting to ED with L sided weakness, slurred speech, and fall. 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   OT comments  Upon arrival, pt lethargic but agreeable to EOB skilled-OT session. Pt with lethargy but with good demeanor - joking with therapist. Able to follow commands with increased time and cues. Pt with appropriate questions and good awareness of situation. Complete supine to sit EOB with Max A to stabilize trunk and lower LLE. Pt able to sit EOB with min guard assist with BUE and feet supported. Pt completed EOB reaching activity with increased time and multimodal cues to open eyes and look at where he was reaching. Pt needing mod A to reach with L UE. Pt returned to supine with Max A to bring BLE's up to bed. Pt left in bed with call bell/phone within reach and bed alarm on. Believe dc plans are still appropriate and will continue to follow acutely.   Follow Up Recommendations  CIR    Equipment Recommendations  Other (comment) (TBD at next venue of care)    Recommendations for Other Services Rehab consult    Precautions / Restrictions Precautions Precautions: Fall Precaution Comments: Stage 3 coccyx wound per WOC note       Mobility Bed Mobility Overal bed mobility: Needs Assistance Bed Mobility: Supine to Sit;Sit to Supine     Supine to sit: Max assist;HOB elevated Sit to supine: Max assist   General bed mobility comments: Assist required for trunk sabilization and rising/lowering BLEs      Balance Overall balance assessment: Needs assistance Sitting-balance support: Feet supported;Single extremity supported Sitting  balance-Leahy Scale: Poor Sitting balance - Comments: min guard for trunk stability Postural control: Right lateral lean                                 ADL either performed or assessed with clinical judgement   ADL Overall ADL's : Needs assistance/impaired     Grooming: Wash/dry face;Set up;Bed level Grooming Details (indicate cue type and reason): pt able to wash/dry face at bed level                                               Cognition Arousal/Alertness: Lethargic;Awake/alert Behavior During Therapy: Flat affect Overall Cognitive Status: Impaired/Different from baseline Area of Impairment: Problem solving;Awareness;Following commands;Attention                   Current Attention Level: Selective   Following Commands: Follows one step commands with increased time   Awareness: Emergent Problem Solving: Slow processing;Decreased initiation;Difficulty sequencing;Requires verbal cues;Requires tactile cues General Comments: Pt with lethargy but able to follow commands with increased time. Pt with apporpriate questions and good awareness               General Comments Pt with increased lethargy but able to participate in EOB reaching activity and tolerate treatment well    Pertinent Vitals/ Pain       Pain Assessment: Faces Faces Pain Scale: Hurts even  more Pain Location: coccyx/buttock Pain Descriptors / Indicators: Discomfort;Grimacing;Guarding;Tender Pain Intervention(s): Limited activity within patient's tolerance;Monitored during session;Repositioned         Frequency  Min 2X/week        Progress Toward Goals  OT Goals(current goals can now be found in the care plan section)  Progress towards OT goals: Progressing toward goals  Acute Rehab OT Goals Patient Stated Goal: To drive my new car ([employer]) OT Goal Formulation: With patient Time For Goal Achievement: 11/01/19 Potential to Achieve Goals: Good  Plan  Discharge plan remains appropriate;Frequency remains appropriate          End of Session    OT Visit Diagnosis: Unsteadiness on feet (R26.81);Other abnormalities of gait and mobility (R26.89);Muscle weakness (generalized) (M62.81);Repeated falls (R29.6);History of falling (Z91.81);Low vision, both eyes (H54.2);Other symptoms and signs involving the nervous system (R29.898);Hemiplegia and hemiparesis Hemiplegia - Right/Left: Left Hemiplegia - dominant/non-dominant: Non-Dominant Hemiplegia - caused by: Cerebral infarction   Activity Tolerance Patient limited by lethargy;Patient limited by pain   Patient Left in bed;with call bell/phone within reach   Nurse Communication Mobility status        Time: 4098-1191 OT Time Calculation (min): 21 min  Charges: OT General Charges $OT Visit: 1 Visit OT Treatments $Therapeutic Activity: 8-22 mins  Simranjit Thayer/OTS   Aleksa Catterton 10/25/2019, 4:19 PM

## 2019-10-25 NOTE — TOC Progression Note (Signed)
Transition of Care [facility]) - Progression Note    Patient Details  Name: Stark Aguinaga MRN: [id number] Date of Birth: [birth year]  Transition of Care [facility] CM/SW [place], [place] Phone Number: 10/25/2019, 11:18 AM  Clinical Narrative:   [facility]ed out [facility] reviewing. CSW to follow.    Expected Discharge Plan: Skilled Nursing Facility Barriers to Discharge: Ship broker, Continued Medical Work up, Inadequate or no insurance  Expected Discharge Plan and Services Expected Discharge Plan: [place] Choice: [place] arrangements for the past 2 months: Single Family Home                                       Social Determinants of Health (SDOH) Interventions    Readmission Risk Interventions No flowsheet data found.

## 2019-10-26 DIAGNOSIS — N390 Urinary tract infection, site not specified: Secondary | ICD-10-CM | POA: Diagnosis not present

## 2019-10-26 DIAGNOSIS — N184 Chronic kidney disease, stage 4 (severe): Secondary | ICD-10-CM | POA: Diagnosis not present

## 2019-10-26 DIAGNOSIS — E43 Unspecified severe protein-calorie malnutrition: Secondary | ICD-10-CM

## 2019-10-26 DIAGNOSIS — G8921 Chronic pain due to trauma: Secondary | ICD-10-CM | POA: Diagnosis not present

## 2019-10-26 DIAGNOSIS — W19XXXA Unspecified fall, initial encounter: Secondary | ICD-10-CM | POA: Diagnosis not present

## 2019-10-26 LAB — CBC
HCT: 28.8 % — ABNORMAL LOW (ref 39.0–52.0)
Hemoglobin: 8.9 g/dL — ABNORMAL LOW (ref 13.0–17.0)
MCH: 27.7 pg (ref 26.0–34.0)
MCHC: 30.9 g/dL (ref 30.0–36.0)
MCV: 89.7 fL (ref 80.0–100.0)
Platelets: 87 10*3/uL — ABNORMAL LOW (ref 150–400)
RBC: 3.21 MIL/uL — ABNORMAL LOW (ref 4.22–5.81)
RDW: 18.3 % — ABNORMAL HIGH (ref 11.5–15.5)
WBC: 4.1 10*3/uL (ref 4.0–10.5)
nRBC: 0 % (ref 0.0–0.2)

## 2019-10-26 LAB — BASIC METABOLIC PANEL
Anion gap: 9 (ref 5–15)
BUN: 32 mg/dL — ABNORMAL HIGH (ref 6–20)
CO2: 23 mmol/L (ref 22–32)
Calcium: 7.7 mg/dL — ABNORMAL LOW (ref 8.9–10.3)
Chloride: 104 mmol/L (ref 98–111)
Creatinine, Ser: 2.48 mg/dL — ABNORMAL HIGH (ref 0.61–1.24)
GFR calc Af Amer: 33 mL/min — ABNORMAL LOW (ref >60)
GFR calc non Af Amer: 28 mL/min — ABNORMAL LOW (ref >60)
Glucose, Bld: 176 mg/dL — ABNORMAL HIGH (ref 70–99)
Potassium: 3.8 mmol/L (ref 3.5–5.1)
Sodium: 136 mmol/L (ref 135–145)

## 2019-10-26 LAB — MAGNESIUM: Magnesium: 2.3 mg/dL (ref 1.7–2.4)

## 2019-10-26 MED ORDER — ADULT MULTIVITAMIN W/MINERALS CH: 1.0000 | ORAL_TABLET | Freq: Every day | ORAL | Status: DC

## 2019-10-26 MED ORDER — HYDROXYZINE HCL 10 MG PO TABS: 10.0000 mg | ORAL_TABLET | Freq: Three times a day (TID) | ORAL | 0 refills | Status: DC | PRN

## 2019-10-26 MED ORDER — DOXYCYCLINE HYCLATE 100 MG PO TABS: 100.0000 mg | ORAL_TABLET | Freq: Two times a day (BID) | ORAL | Status: AC

## 2019-10-26 MED ORDER — SODIUM BICARBONATE 650 MG PO TABS: 1950.0000 mg | ORAL_TABLET | Freq: Three times a day (TID) | ORAL | Status: DC

## 2019-10-26 MED ORDER — TRAMADOL HCL 50 MG PO TABS: 50.0000 mg | ORAL_TABLET | Freq: Four times a day (QID) | ORAL | 0 refills | Status: DC | PRN

## 2019-10-26 MED ORDER — GABAPENTIN 300 MG PO CAPS: 300.0000 mg | ORAL_CAPSULE | Freq: Two times a day (BID) | ORAL | Status: DC

## 2019-10-26 MED ORDER — MIDODRINE HCL 2.5 MG PO TABS: 2.5000 mg | ORAL_TABLET | Freq: Three times a day (TID) | ORAL | Status: DC

## 2019-10-26 MED ORDER — ASPIRIN 81 MG PO TBEC: 81.0000 mg | DELAYED_RELEASE_TABLET | Freq: Every day | ORAL | Status: DC

## 2019-10-26 MED ORDER — ENSURE ENLIVE PO LIQD: 237.0000 mL | Freq: Three times a day (TID) | ORAL | 12 refills | Status: DC

## 2019-10-26 NOTE — TOC Transition Note (Signed)
Transition of Care [facility] - CM/SW Discharge Note   Patient Details  Name: Chase [name] MRN: [id number] Date of Birth: 1965-07-28  Transition of Care [facility]) CM/SW Contact:  Joanne Chars, LCSW Phone Number: 10/26/2019, 10:17 AM   Clinical Narrative:   Nurse to call report to [phone], Rm [id number]    Final next level of care: Skilled Nursing Facility Barriers to Discharge: Barriers Resolved   Patient Goals and CMS Choice Patient states their goals for this hospitalization and ongoing recovery are:: get better      Discharge Placement              Patient [facility] bed at: [facility] Patient to be transferred to facility by: [place] Name of family member notified: Self, Son Patient and family notified of of transfer: 10/26/19  Discharge Plan and Services     Post Acute Care Choice: [place]                               Social Determinants of Health (SDOH) Interventions     Readmission Risk Interventions No flowsheet data found.

## 2019-10-26 NOTE — Discharge Summary (Signed)
Physician Discharge Summary  Chase [name] CBU:[id number] DOB: December 27, 1965 DOA: 10/17/2019  PCP: [name]  Admit date: 10/17/2019 Discharge date: 10/26/2019  Time spent: 45 minutes  Recommendations for Outpatient Follow-up:  Patient will be discharged to skilled nursing facility, continue therapy.  Patient will need to follow up with primary care provider within one week of discharge. Repeat CBC, BMP, and magnesium in one week.  Follow up with urology at the [facility]. Follow up with neurology, Dr. Leonie Man. Patient should continue medications as prescribed.  Patient should follow a regular diet.   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 [name] Patient is a 54 year old male with history of GERD, PTSD, CKD stage IV, history of cervical radiculopathy, chronic pain syndrome, follows pain clinic presented with left arm and left leg weakness since this morning. Patient reports that he ambulates by himself with the help of a cane. He last went to bed normal. However when he woke up this morning, he was unable to move his left side. He denied any headache, any blurry vision. He also felt numbness and tingling in his left side. Denied any prior history of stroke. Patient reports that he he lives alone and called  the EMS.  Denied any chest pain, shortness of breath, abdominal pain, hematochezia or melena. 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  -Noninvasive angiography showed no LVO, or other significant atherosclerosis, carotids without stenosis -Echocardiogram EF of 60 to 03%, grade 1 diastolic dysfunction -Neurology consulted and appreciated -Patient with history of atrial fibrillation, currently not an anticoagulation candidate -TPA not given because thrombocytopenia as well as unknown onset of symptoms -Continue aspirin 81 mg -LDL 26, hemoglobin A1c 5.4 -PT recommended CIR however not in network with [facility].  TOC consulted- patient will likely go to SNF. -Patient stated that he was having left eyelid heaviness and felt it difficult to keep it open.  Discussed with Dr. [name], feels that patient is able to control this. -Today, both eyes are open with no issues  Acute thrombocytopenia-suspect secondary to tickborne illness -Platelets currently stable, around 60,000 without any evidence of bleeding -Oncology/hematology and nephrology consulted and appreciated -TTP/pus relatively less likely than thrombocytopenia due to underlying infection (tickborne illness suspected, UTI) -Medication induced thrombocytopenia also unlikely -Abdominal ultrasound: Exam limited by overlying bowel gas.  Gallbladder and common bile duct not visualized.  Left hepatic lobe not visualized.  Liver has heterogeneous parenchymal pattern suggesting possibility of fatty infiltration or hepatocellular disease.  Spleen is normal size.  Increased echogenicity of both kidneys consistent with chronic medical renal disease.  Mild bilateral  hydronephrosis. -LDH within normal range, Coombs test negative, reticulocytes not elevated.  ADAMTS13 33.9 -platelets appear to be improving, up to 87 today -Repeat CBC in one  week  Mass like Structure noted in right pulmonary artery -Noted on echocardiogram -Recommended CT PE study to exclude PE however patient does have CKD stage V at baseline -patient also noted to have elevated D-dimer, 3.19- obtained V/Q scan, which was normal -Question if patient needs a TEE- discussed this with neurology on 7/21, did not feel that TEE was indicated from stroke perspective  -Discussed obtaining CTA/PE study with nephrology.  Recommended pre and post hydration with normal saline at 100 cc/h for 6 hours prior, 6 hours post. -CTA PE study negative for PE.  Abnormalities noted are associated with the right pulmonary artery to explain echocardiogram findings.  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  Urology was consulted and patient was taken for cystoscopy showing large 300 cc posterior bladder diverticulum, purulent contents, and urine culture was showing multiple species .  Urology recommended outpatient follow-up with possible diverticulectomy.  He was treated with  ceftriaxone and then cefdinir on discharge. -Patient was complaining of baseline urinary hesitancy with dysuria -Urine culture shows multiple species -Currently on ceftriaxone -Patient need to follow-up with the [facility] urology for diverticulectomy as an outpatient  BPH -Continue Flomax  Hypoalbuminemia/ Severe protein calorie malnutrition -As evidenced by severe loss of subcutaneous muscle mass and fat -Likely secondary to chronic illness, poor oral intake -BMI 18.5, albumin 1.6 -Nutrition consulted  PTSD/chronic pain syndrome -Continue duloxetine, gabapentin, tramadol -reviewed [place] Controlled Substance database, script given as patient is being discharged to SNF  History of Hodgkin's lymphoma -Diagnosed in 2010 -Status post ABVD and radiation therapy which completed in 2011  Anemia of chronic disease/chronic B12 deficiency/history of gastric bypass -Hemoglobin was down to 7.3, and patient was given 1u PRBC -hemoglobin currently 8.9 and as remained stable for several ays -Continue B12 supplements -Work-up thus far shows a negative DAT, LDH, haptoglobin -Repeat CBC in one week  Stage I sacral pressure injury -Present on admission -Continue wound care -Mattress has been replaced  Hypokalemia/hypomagnesemia -Resolved with supplementation -repeat BMP and mag in one week  Consultants Neurology  Nephrology Hematology PMR/CIR  Procedures  Echocardiogram Abdominal US  Discharge Exam: Vitals:   10/26/19 0600 10/26/19 0726  BP:  116/70  Pulse: 56 71  Resp: 12 13  Temp:  98.2 F (36.8 C)  SpO2: 99% 100%     General: Well developed, chronically ill appearing, thin, NAD  HEENT: NCAT, mucous membranes moist.  Neuro: AAOx3, no new deficits  Psych: Appropriate mood and affect  Discharge Instructions Discharge Instructions    Ambulatory referral to Neurology   Complete by: As directed    Follow up with Dr. Leonie Man at [facility] in 4-6 weeks. Too complicated for RN to  follow. Thanks.   Discharge instructions   Complete by: As directed    Patient will be discharged to skilled nursing facility, continue therapy.  Patient will  need to follow up with primary care provider within one week of discharge. Repeat CBC, BMP, and magnesium in one week.  Follow up with urology at the [facility]. Follow up with neurology, Dr. Leonie Man. Patient should continue medications as prescribed.  Patient should follow a regular diet.     Allergies as of 10/26/2019      Reactions   Nsaids    Other reaction(s): Other Bleeding GI Ulceration      Medication List    STOP taking these medications   gabapentin 800 MG tablet Commonly known as: NEURONTIN Replaced by: gabapentin 300 MG capsule     TAKE these medications   aspirin 81 MG EC tablet Take 1 tablet (81 mg total) by mouth daily. Swallow whole. Start taking on: October 27, 2019   cyanocobalamin 1000 MCG/ML injection Commonly known as: (VITAMIN B-12) Inject 1,000 mcg into the muscle every 30 (thirty) days.   doxycycline 100 MG tablet Commonly known as: VIBRA-TABS Take 1 tablet (100 mg total) by mouth every 12 (twelve) hours for 7 days.   DULoxetine 30 MG capsule Commonly known as: CYMBALTA Take 30-60 mg by mouth See admin instructions. Take 2 capsules in the morning and 1 capsule in the evening   feeding supplement (ENSURE ENLIVE) Liqd Take 237 mLs by mouth 3 (three) times daily between meals.   gabapentin 300 MG capsule Commonly known as: NEURONTIN Take 1 capsule (300 mg total) by mouth 2 (two) times daily. Replaces: gabapentin 800 MG tablet   hydrOXYzine 10 MG tablet Commonly known as: ATARAX/VISTARIL Take 1 tablet (10 mg total) by mouth 3 (three) times daily as needed for itching or anxiety. What changed:   how much to take  when to take this  reasons to take this   midodrine 2.5 MG tablet Commonly known as: PROAMATINE Take 1 tablet (2.5 mg total) by mouth 3 (three) times daily.   multivitamin with minerals  Tabs tablet Take 1 tablet by mouth daily. Start taking on: October 27, 2019   pantoprazole 40 MG tablet Commonly known as: PROTONIX Take 40 mg by mouth 2 (two) times daily.   sodium bicarbonate 650 MG tablet Take 3 tablets (1,950 mg total) by mouth 3 (three) times daily.   tamsulosin 0.4 MG Caps capsule Commonly known as: FLOMAX Take 0.4 mg by mouth daily.   traMADol 50 MG tablet Commonly known as: ULTRAM Take 1 tablet (50 mg total) by mouth every 6 (six) hours as needed. What changed:   when to take this  reasons to take this      Allergies  Allergen Reactions  . Nsaids     Other reaction(s): Other Bleeding GI Ulceration    Follow-up Information    Garvin Fila, MD. Schedule an appointment as soon as possible for a visit in 4 week(s).   Specialties: Neurology, Radiology Contact information: [address] [place] [address] [facility] [place] Schedule an appointment as soon as possible for a visit in 1 week(s).   Why: Hospital follow up Contact information: [place] [address] [phone]                The results of significant diagnostics from this hospitalization (including imaging, microbiology, ancillary and laboratory) are listed below for reference.    Significant Diagnostic Studies: CT Angio Head W or Wo Contrast  Result Date: 10/17/2019 CLINICAL DATA:  [name] and slurred speech EXAM: CT ANGIOGRAPHY  HEAD AND NECK TECHNIQUE: Multidetector CT imaging of the head and neck was performed using the standard protocol during bolus administration of intravenous contrast. Multiplanar CT image reconstructions and MIPs were obtained to evaluate the vascular anatomy. Carotid stenosis measurements (when applicable) are obtained utilizing NASCET criteria, using the distal internal carotid diameter as the denominator. CONTRAST:  66mL OMNIPAQUE IOHEXOL 350 MG/ML SOLN  COMPARISON:  None. FINDINGS: CTA NECK Aortic arch: Great vessel origins are incompletely included. Visualized arch is unremarkable. Right carotid system: Patent. There is no measurable stenosis at the ICA origin. Left carotid system: Common carotid origin is not included. Patent. Trace calcified plaque at the ICA origin without measurable stenosis. Vertebral arteries: Patent. Left vertebral artery is slightly dominant. Skeleton: Evidence of prior anterior fusion at C3-C4 with plate and screw fixation and well incorporated interbody graft. Degenerative changes of the cervical spine. Other neck: No mass or adenopathy. Upper chest: Included upper lungs are clear. Review of the MIP images confirms the above findings CTA HEAD Anterior circulation: Intracranial internal carotid arteries are patent. Anterior cerebral arteries are patent. 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 Middle cerebral arteries are patent. Posterior circulation: Intracranial vertebral arteries, basilar artery, and posterior cerebral arteries are patent. Venous sinuses: Patent as allowed by contrast bolus timing. Review of the MIP images confirms the above findings IMPRESSION: No large vessel occlusion or hemodynamically significant stenosis. Electronically Signed   By: Macy Mis M.D.   On: 10/17/2019 13:15   DG Chest 1 View  Result Date: 10/20/2019 CLINICAL DATA:  Elevated D-dimer. History of chemical exposure, GI bleeding. EXAM: CHEST  1 VIEW COMPARISON:  10/17/2019 FINDINGS: Shallow lung inflation. Heart size is normal. There are no focal consolidations or pleural effusions. IMPRESSION: Shallow inflation.  No evidence for acute  abnormality. Electronically Signed   By: Nolon Nations M.D.   On: 10/20/2019 18:44   CT HEAD WO CONTRAST  Result Date: 10/18/2019 CLINICAL DATA:  Follow-up stroke. [name] and numbness. Scattered small infarctions by MRI. EXAM: CT HEAD WITHOUT CONTRAST TECHNIQUE: Contiguous axial images were  obtained from the base of the skull through the vertex without intravenous contrast. COMPARISON:  10/17/2019 FINDINGS: Brain: No acute finding affecting the brainstem or cerebellum by CT. Cerebral hemispheres show generalized atrophy, somewhat premature for age. Subtle evidence cortical infarction in the right occipital lobe. No mass effect or hemorrhage. Other small infarctions seen by MRI not visible by CT. No hemorrhage, hydrocephalus or extra-axial collection. Vascular: There is atherosclerotic calcification of the major vessels at the base of the brain. Skull: Negative Sinuses/Orbits: Clear/normal Other: None IMPRESSION: Subtle evidence of cortical infarction in the right occipital region. No mass effect or hemorrhage. None of the other acute infarctions visible by MRI are visible on CT. Generalized volume loss, somewhat premature for age. Electronically Signed   By: Nelson Chimes M.D.   On: 10/18/2019 13:24   CT ANGIO CHEST PE W OR WO CONTRAST  Result Date: 10/22/2019 CLINICAL DATA:  Masslike structure around right pulmonary artery during echocardiography, positive D-dimer EXAM: CT ANGIOGRAPHY CHEST WITH CONTRAST TECHNIQUE: Multidetector CT imaging of the chest was performed using the standard protocol during bolus administration of intravenous contrast. Multiplanar CT image reconstructions and MIPs were obtained to evaluate the vascular anatomy. CONTRAST:  39mL OMNIPAQUE IOHEXOL 350 MG/ML SOLN COMPARISON:  10/20/2019 FINDINGS: Cardiovascular: This is a technically adequate evaluation of the pulmonary vasculature. There are no filling defects or pulmonary emboli. The pulmonary artery is normal in caliber. I do not see  any abnormalities associated with the right pulmonary artery to explain the reported echocardiography findings. There is a trace pericardial effusion. Evaluation of the thoracic aorta is limited due to the timing of contrast bolus. No evidence of thoracic aortic aneurysm. Mediastinum/Nodes: No  enlarged mediastinal, hilar, or axillary lymph nodes. Thyroid gland, trachea, and esophagus demonstrate no significant findings. Lungs/Pleura: There are small bilateral pleural effusions, volume estimated less than 500 cc each. Dependent lower lobe atelectasis. No airspace disease or pneumothorax. The central airways are patent. Upper Abdomen: Trace ascites is noted throughout the upper abdomen. Colonic interposition right upper quadrant. Postsurgical changes from previous gastric surgery. Musculoskeletal: The patient is cachectic. There are nondisplaced right posterolateral eighth and ninth rib fractures. There are nondisplaced left anterolateral seventh and eighth rib fractures. Reconstructed images demonstrate no additional findings. Review of the MIP images confirms the above findings. IMPRESSION: 1. No evidence of pulmonary embolus. I do not see any abnormalities associated with the right pulmonary artery to explain the reported echocardiography findings. 2. Small bilateral pleural effusions. 3. Trace ascites. 4. Nondisplaced bilateral rib fractures. Electronically Signed   By: Randa Ngo M.D.   On: 10/22/2019 00:37   MR BRAIN WO CONTRAST  Result Date: 10/18/2019 CLINICAL DATA:  Stroke follow-up.  [name] and numbness. EXAM: MRI HEAD WITHOUT CONTRAST TECHNIQUE: Multiplanar, multiecho pulse sequences of the brain and surrounding structures were obtained without intravenous contrast. COMPARISON:  Head CT and CTA 10/17/2019 FINDINGS: Brain: There are subcentimeter acute infarcts in the cerebellum bilaterally. There is a small acute cortical infarct in the posteromedial right occipital lobe. There are multiple subcentimeter acute infarcts in the right corpus callosum, right cingulate gyrus, and right greater than left frontal lobes. No intracranial hemorrhage, mass, midline shift, or extra-axial fluid collection is identified. The ventricles and sulci are normal. Vascular: Major intracranial  vascular flow voids are preserved. Skull and upper cervical spine: No suspicious marrow lesion. Anterior fusion at C3-4. Sinuses/Orbits: Unremarkable orbits. Clear paranasal sinuses. Small right mastoid effusion. Other: None. 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 Electronically Signed   By: Logan Bores M.D.   On: 10/18/2019 07:34   NM Pulmonary Perfusion  Result Date: 10/20/2019 CLINICAL DATA:  54 year old male with concern for pulmonary embolism. Positive D-dimer. EXAM: NUCLEAR MEDICINE PERFUSION LUNG SCAN TECHNIQUE: Perfusion images were obtained in multiple projections after intravenous injection of radiopharmaceutical. Ventilation scans intentionally deferred if perfusion scan and chest x-ray adequate for interpretation during COVID 19 epidemic. RADIOPHARMACEUTICALS:  4.2 mCi Tc-51m MAA IV COMPARISON:  Chest radiograph dated 10/20/2019. FINDINGS: There is homogeneous perfusion of the lungs. No focal defect identified. IMPRESSION: Normal perfusion scan. Electronically Signed   By: Anner Crete M.D.   On: 10/20/2019 18:41   US Abdomen Complete  Result Date: 10/19/2019 CLINICAL DATA:  Thrombocytopenia.  Chronic renal disease. EXAM: ABDOMEN ULTRASOUND COMPLETE COMPARISON:  No prior. FINDINGS: Gallbladder: Not visualized. Common bile duct: Diameter: Not visualized. Liver: Left lobe not visualized. Heterogeneous parenchymal pattern where visualized. No focal hepatic mass identified. Portal vein is patent on color Doppler imaging with normal direction of blood flow towards the liver. IVC: Not visualized. Pancreas: Visualized portion unremarkable. Spleen: Size and appearance within normal limits. Spleen measures 11.2 cm. Right Kidney: Length: 9.5 cm. Increased echogenicity consistent chronic medical renal disease. Mild right hydronephrosis. No mass noted. Left Kidney: Length: 10.4 cm. Increased echogenicity consistent chronic medical renal  disease. Mild left hydronephrosis. No mass noted. Small amount of perinephric fluid. Abdominal  aorta: Poorly visualized. Other findings: Ascites and bilateral pleural effusions noted. This was an extremely limited exam due to overlying bowel gas. IMPRESSION: 1. Extremely limited exam due to overlying bowel gas. Gallbladder and common bile duct not visualized. Left hepatic lobe not visualized. 2. Liver has a heterogeneous parenchymal pattern suggesting the possibility of fatty infiltration or hepatocellular disease. Spleen is normal in size. 3. Increased echogenicity both kidneys consistent chronic medical renal disease. Mild bilateral hydronephrosis. Small amount of left perinephric fluid noted. 4.  Ascites and bilateral pleural effusions noted. Electronically Signed   By: Marcello Moores  Register   On: 10/19/2019 13:43   DG Chest Portable 1 View  Result Date: 10/17/2019 CLINICAL DATA:  Weakness, Reports went to bed last night today awakened and fell to the left sitting up - then fell out of bed In ED makes sffort to raise L arm, but cannot Cannot raise either R or L leg EXAM: PORTABLE CHEST 1 VIEW COMPARISON:  None. FINDINGS: The heart size and mediastinal contours are within normal limits. Low lung volumes. The lungs are clear. No pneumothorax or significant pleural effusion. No acute finding in the visualized skeleton. Prominent distended loops of bowel in the abdomen. IMPRESSION: No acute cardiopulmonary finding. Electronically Signed   By: Audie Pinto M.D.   On: 10/17/2019 13:59   ECHOCARDIOGRAM COMPLETE  Result Date: 10/19/2019    ECHOCARDIOGRAM REPORT   Patient Name:   BEREKET GERNERT [name] Date of Exam: 10/19/2019 Medical Rec #:  [id number]          Height:       68.0 in Accession #:    [id number]         Weight:       122.0 lb Date of Birth:  April 12, 1965          BSA:          1.657 m Patient Age:    1 years           BP:           117/78 mmHg Patient Gender: M                  HR:           64 bpm. Exam  Location:  Inpatient Procedure: 2D Echo, Cardiac Doppler and Color Doppler Indications:    Stroke  History:        Patient has no prior history of Echocardiogram examinations.                 Stroke. Elevated troponin.  Sonographer:    Roseanna Rainbow RDCS Referring Phys: [id number] RIPUDEEP K RAI  Sonographer Comments: Technically difficult study due to poor echo windows and no subcostal window. Difficult due to very thin habitus. Very low parasternal. No true subcostal view and could not visualize IVC. IMPRESSIONS  1. There is a mass like structure noted in the right pulmonary artery. This is seen on the suprasternal notch views. Unable to exclude pulmonary embolism. Given elevated troponin, would consider CT PE study to exclude PE.  2. Left ventricular ejection fraction, by estimation, is 60 to 65%. The left ventricle has normal function. The left ventricle has no regional wall motion abnormalities. Left ventricular diastolic parameters are consistent with Grade I diastolic dysfunction (impaired relaxation).  3. Right ventricular systolic function is normal. The right ventricular size is normal. Tricuspid regurgitation signal is inadequate for assessing PA pressure.  4. There is a moderate pericardial effusion up to  1.5 cm over the anterior RV and LV apex. There is no RA/RV diastolic collapse. There is no respiratory variation in MV inflow. The IVC is now well visualized. There are no overt echocardiographic signs of tamponade. Moderate pericardial effusion. The pericardial effusion is anterior to the right ventricle and surrounding the apex. There is no evidence of cardiac tamponade.  5. The mitral valve is grossly normal. Trivial mitral valve regurgitation. No evidence of mitral stenosis.  6. The aortic valve is tricuspid. Aortic valve regurgitation is not visualized. No aortic stenosis is present. FINDINGS  Left Ventricle: Left ventricular ejection fraction, by estimation, is 60 to 65%. The left ventricle has normal  function. The left ventricle has no regional wall motion abnormalities. The left ventricular internal cavity size was normal in size. There is  no left ventricular hypertrophy. Left ventricular diastolic parameters are consistent with Grade I diastolic dysfunction (impaired relaxation). Right Ventricle: The right ventricular size is normal. No increase in right ventricular wall thickness. Right ventricular systolic function is normal. Tricuspid regurgitation signal is inadequate for assessing PA pressure. Left Atrium: Left atrial size was normal in size. Right Atrium: Right atrial size was normal in size. Pericardium: There is a moderate pericardial effusion up to 1.5 cm over the anterior RV and LV apex. There is no RA/RV diastolic collapse. There is no respiratory variation in MV inflow. The IVC is now well visualized. There are no overt echocardiographic signs of tamponade. A moderately sized pericardial effusion is present. The pericardial effusion is anterior to the right ventricle and surrounding the apex. The pericardial effusion appears to contain fibrous material. There is no evidence of cardiac tamponade. Presence of pericardial fat pad. Mitral Valve: The mitral valve is grossly normal. Trivial mitral valve regurgitation. No evidence of mitral valve stenosis. Tricuspid Valve: The tricuspid valve is grossly normal. Tricuspid valve regurgitation is trivial. No evidence of tricuspid stenosis. Aortic Valve: The aortic valve is tricuspid. Aortic valve regurgitation is not visualized. No aortic stenosis is present. Pulmonic Valve: The pulmonic valve was grossly normal. Pulmonic valve regurgitation is not visualized. No evidence of pulmonic stenosis. Aorta: The aortic root is normal in size and structure. Venous: The inferior vena cava was not well visualized. IAS/Shunts: The atrial septum is grossly normal.  LEFT VENTRICLE PLAX 2D LVIDd:         4.20 cm      Diastology LVIDs:         2.80 cm      LV e' lateral:    11.10 cm/s LV PW:         1.20 cm      LV E/e' lateral: 4.2 LV IVS:        1.10 cm      LV e' medial:    6.64 cm/s LVOT diam:     2.00 cm      LV E/e' medial:  7.0 LV SV:         41 LV SV Index:   25 LVOT Area:     3.14 cm  LV Volumes (MOD) LV vol d, MOD A2C: 116.0 ml LV vol d, MOD A4C: 93.8 ml LV vol s, MOD A2C: 43.3 ml LV vol s, MOD A4C: 39.7 ml LV SV MOD A2C:     72.7 ml LV SV MOD A4C:     93.8 ml LV SV MOD BP:      65.0 ml RIGHT VENTRICLE RV S prime:     18.10 cm/s TAPSE (M-mode): 2.8 cm  LEFT ATRIUM           Index       RIGHT ATRIUM           Index LA diam:      3.80 cm 2.29 cm/m  RA Area:     12.00 cm LA Vol (A2C): 32.7 ml 19.74 ml/m RA Volume:   24.80 ml  14.97 ml/m LA Vol (A4C): 24.5 ml 14.79 ml/m  AORTIC VALVE LVOT Vmax:   60.10 cm/s LVOT Vmean:  36.000 cm/s LVOT VTI:    0.132 m  AORTA Ao Root diam: 4.10 cm MITRAL VALVE MV Area (PHT): 2.21 cm    SHUNTS MV Decel Time: 343 msec    Systemic VTI:  0.13 m MV E velocity: 46.20 cm/s  Systemic Diam: 2.00 cm MV A velocity: 45.10 cm/s MV E/A ratio:  1.02 Eleonore Chiquito MD Electronically signed by Eleonore Chiquito MD Signature Date/Time: 10/19/2019/5:35:14 PM    Final    CT HEAD CODE STROKE WO CONTRAST  Result Date: 10/17/2019 CLINICAL DATA:  Code stroke. Subacute deficit with left-sided weakness. EXAM: CT HEAD WITHOUT CONTRAST TECHNIQUE: Contiguous axial images were obtained from the base of the skull through the vertex without intravenous contrast. COMPARISON:  None. FINDINGS: Brain: No evidence of acute infarction, hemorrhage, hydrocephalus, extra-axial collection or mass lesion/mass effect. Vascular: Negative for hyperdense vessel Skull: Negative Sinuses/Orbits: Mild mucosal edema left maxillary sinus otherwise clear sinuses. Negative orbit. Other: None ASPECTS ([place] Stroke Program Early CT Score) - Ganglionic level infarction (caudate, lentiform nuclei, internal capsule, insula, M1-M3 cortex): 7 - Supraganglionic infarction (M4-M6 cortex): 3 Total score  (0-10 with 10 being normal): 10 IMPRESSION: 1. No acute abnormality 2. ASPECTS is 10 3. These results were called by telephone at the time of interpretation on 10/17/2019 at 12:56 pm to provider MATTHEW TRIFAN , who verbally acknowledged these results. Electronically Signed   By: Franchot Gallo M.D.   On: 10/17/2019 12:54   CT ANGIO NECK CODE STROKE  Result Date: 10/17/2019 CLINICAL DATA:  [name] and slurred speech EXAM: CT ANGIOGRAPHY HEAD AND NECK TECHNIQUE: Multidetector CT imaging of the head and neck was performed using the standard protocol during bolus administration of intravenous contrast. Multiplanar CT image reconstructions and MIPs were obtained to evaluate the vascular anatomy. Carotid stenosis measurements (when applicable) are obtained utilizing NASCET criteria, using the distal internal carotid diameter as the denominator. CONTRAST:  16mL OMNIPAQUE IOHEXOL 350 MG/ML SOLN COMPARISON:  None. FINDINGS: CTA NECK Aortic arch: Great vessel origins are incompletely included. Visualized arch is unremarkable. Right carotid system: Patent. There is no measurable stenosis at the ICA origin. Left carotid system: Common carotid origin is not included. Patent. Trace calcified plaque at the ICA origin without measurable stenosis. Vertebral arteries: Patent. Left vertebral artery is slightly dominant. Skeleton: Evidence of prior anterior fusion at C3-C4 with plate and screw fixation and well incorporated interbody graft. Degenerative changes of the cervical spine. Other neck: No mass or adenopathy. Upper chest: Included upper lungs are clear. Review of the MIP images confirms the above findings CTA HEAD Anterior circulation: Intracranial internal carotid arteries are patent. Anterior cerebral arteries are patent. 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 Middle cerebral arteries are patent. Posterior circulation: Intracranial vertebral arteries, basilar artery, and posterior cerebral arteries are patent.  Venous sinuses: Patent as allowed by contrast bolus timing. Review of the MIP images confirms the above findings IMPRESSION: No large vessel occlusion or hemodynamically significant stenosis. Electronically Signed   By: Malachi Carl  Patel M.D.   On: 10/17/2019 13:15    Microbiology: Recent Results (from the past 240 hour(s))  SARS Coronavirus 2 by RT PCR (hospital order, performed in [facility] hospital lab) Nasopharyngeal Nasopharyngeal Swab     Status: None   Collection Time: 10/17/19  1:14 PM   Specimen: Nasopharyngeal Swab  Result Value Ref Range Status   SARS Coronavirus 2 NEGATIVE NEGATIVE Final    Comment: (NOTE) SARS-CoV-2 target nucleic acids are NOT DETECTED.  The SARS-CoV-2 RNA is generally detectable in upper and lower respiratory specimens during the acute phase of infection. The lowest concentration of SARS-CoV-2 viral copies this assay can detect is 250 copies / mL. A negative result does not preclude SARS-CoV-2 infection and should not be used as the sole basis for treatment or other patient management decisions.  A negative result may occur with improper specimen collection / handling, submission of specimen other than nasopharyngeal swab, presence of viral mutation(s) within the areas targeted by this assay, and inadequate number of viral copies (<250 copies / mL). A negative result must be combined with clinical observations, patient history, and epidemiological information.  Fact Sheet for Patients:   StrictlyIdeas.no  Fact Sheet for Healthcare Providers: BankingDealers.co.za  This test is not yet approved or  cleared by the [place] FDA and has been authorized for detection and/or diagnosis of SARS-CoV-2 by FDA under an Emergency Use Authorization (EUA).  This EUA will remain in effect (meaning this test can be used) for the duration of the COVID-19 declaration under Section 564(b)(1) of the Act, 21  U.S.C. section 360bbb-3(b)(1), unless the authorization is terminated or revoked sooner.  Performed at [facility], [address] [place], [place] [address]   Blood culture (routine x 2)     Status: None   Collection Time: 10/17/19  2:26 PM   Specimen: Left Antecubital; Blood  Result Value Ref Range Status   Specimen Description   Final    LEFT ANTECUBITAL BOTTLES DRAWN AEROBIC AND ANAEROBIC   Special Requests Blood Culture adequate volume  Final   Culture   Final    NO GROWTH 5 DAYS Performed at [facility], [address] [place], [place] [address]    Report Status 10/22/2019 FINAL  Final  Urine culture     Status: Abnormal   Collection Time: 10/17/19  2:37 PM   Specimen: Urine, Catheterized  Result Value Ref Range Status   Specimen Description   Final    URINE, CATHETERIZED Performed at [facility], [address] [place], [place] [address]    Special Requests   Final    NONE Performed at [facility], [address], [place], [place] [address]    Culture MULTIPLE SPECIES PRESENT, SUGGEST RECOLLECTION (A)  Final   Report Status 10/19/2019 FINAL  Final  Blood culture (routine x 2)     Status: None   Collection Time: 10/17/19  2:41 PM   Specimen: Right Antecubital; Blood  Result Value Ref Range Status   Specimen Description   Final    RIGHT ANTECUBITAL BOTTLES DRAWN AEROBIC AND ANAEROBIC   Special Requests Blood Culture adequate volume  Final   Culture   Final    NO GROWTH 5 DAYS Performed at [facility], [address] [place],  [address]    Report Status 10/22/2019 FINAL  Final  SARS CORONAVIRUS 2 (TAT 6-24 HRS) Nasopharyngeal Nasopharyngeal Swab     Status: None   Collection Time: 10/25/19  3:40 PM   Specimen: Nasopharyngeal  Swab  Result Value Ref Range Status   SARS Coronavirus 2 NEGATIVE NEGATIVE Final    Comment: (NOTE) SARS-CoV-2 target nucleic acids are NOT DETECTED.  The SARS-CoV-2 RNA is generally detectable in upper and lower respiratory  specimens during the acute phase of infection. Negative results do not preclude SARS-CoV-2 infection, do not rule out co-infections with other pathogens, and should not be used as the sole basis for treatment or other patient management decisions. Negative results must be combined with clinical observations, patient history, and epidemiological information. The expected result is Negative.  Fact Sheet for Patients: SugarRoll.be  Fact Sheet for Healthcare Providers: https://www.woods-mathews.com/  This test is not yet approved or cleared by the [place] FDA and  has been authorized for detection and/or diagnosis of SARS-CoV-2 by FDA under an Emergency Use Authorization (EUA). This EUA will remain  in effect (meaning this test can be used) for the duration of the COVID-19 declaration under Se ction 564(b)(1) of the Act, 21 U.S.C. section 360bbb-3(b)(1), unless the authorization is terminated or revoked sooner.  Performed at [facility], [place] [address], [place], [place] [address]      Labs: Basic Metabolic Panel: Recent Labs  Lab 10/20/19 0618 10/21/19 0449 10/22/19 1129 10/25/19 0251 10/26/19 0250  NA 137 136 137 134* 136  K 3.8 3.7 3.6 3.4* 3.8  CL 113* 112* 112* 103 104  CO2 17* 17* 17* 22 23  GLUCOSE 92 95 173* 144* 176*  BUN 32* 31* 29* 30* 32*  CREATININE 3.06* 2.81* 2.52* 2.35* 2.48*  CALCIUM 7.2* 7.2* 7.3* 7.4* 7.7*  MG  --   --   --  1.5* 2.3   Liver Function Tests: Recent Labs  Lab 10/20/19 0618  AST 26  ALT 19  ALKPHOS 94  BILITOT 0.5  PROT 5.0*  ALBUMIN 1.6*   No results for input(s): LIPASE, AMYLASE in the last 168 hours. No results for input(s): AMMONIA in the last 168 hours. CBC: Recent Labs  Lab 10/20/19 0618 10/21/19 0449 10/22/19 1129 10/25/19 0251 10/26/19 0250  WBC 6.2 6.0 6.9 6.9 4.1  HGB 8.9* 9.1* 8.9* 8.9* 8.9*  HCT 28.0* 29.0* 28.9* 28.4* 28.8*  MCV 88.1 88.4 90.3 89.9 89.7   PLT 61* 66* 76* 80* 87*   Cardiac Enzymes: No results for input(s): CKTOTAL, CKMB, CKMBINDEX, TROPONINI in the last 168 hours. BNP: BNP (last 3 results) No results for input(s): BNP in the last 8760 hours.  ProBNP (last 3 results) No results for input(s): PROBNP in the last 8760 hours.  CBG: No results for input(s): GLUCAP in the last 168 hours.     Signed:  Cristal Ford  [facility] 10/26/2019, 8:52 AM

## 2019-10-26 NOTE — Progress Notes (Signed)
Pt discharged to [facility]. Attempted to call report to receiving nurse at the facility but unsuccessful x 3. Pt transported via [facility] and AVS sent in envelope with patient to facility. IV removed with no bleeding noted. All belongings returned to patient to take along with him to the facility.

## 2019-10-26 NOTE — Progress Notes (Signed)
°   10/26/19 0327  Assess: MEWS Score  BP 110/70  Pulse Rate 70  ECG Heart Rate 70  Resp (!) 5  SpO2 98 %  Assess: MEWS Score  MEWS Temp 0  MEWS Systolic 0  MEWS Pulse 0  MEWS RR 2  MEWS LOC 0  MEWS Score 2  MEWS Score Color Yellow  Assess: if the MEWS score is Yellow or Red  Were vital signs taken at a resting state? Yes  Focused Assessment No change from prior assessment  Early Detection of Sepsis Score *See Row Information* Low  MEWS guidelines implemented *See Row Information* No, vital signs rechecked  Treat  Pain Scale 0-10  Pain Score Asleep

## 2019-11-07 ENCOUNTER — Other Ambulatory Visit: Payer: Self-pay

## 2019-11-07 ENCOUNTER — Emergency Department (HOSPITAL_COMMUNITY): Payer: No Typology Code available for payment source

## 2019-11-07 ENCOUNTER — Inpatient Hospital Stay (HOSPITAL_COMMUNITY)
Admission: EM | Admit: 2019-11-07 | Discharge: 2019-11-19 | DRG: 853 | Disposition: A | Discharge status: Skilled Nursing Facility | Payer: No Typology Code available for payment source | Source: Skilled Nursing Facility | Attending: Internal Medicine | Admitting: Internal Medicine

## 2019-11-07 ENCOUNTER — Encounter (HOSPITAL_COMMUNITY): Payer: Self-pay

## 2019-11-07 DIAGNOSIS — N321 Vesicointestinal fistula: Secondary | ICD-10-CM | POA: Diagnosis present

## 2019-11-07 DIAGNOSIS — N401 Enlarged prostate with lower urinary tract symptoms: Secondary | ICD-10-CM | POA: Diagnosis present

## 2019-11-07 DIAGNOSIS — Z681 Body mass index (BMI) 19 or less, adult: Secondary | ICD-10-CM

## 2019-11-07 DIAGNOSIS — R338 Other retention of urine: Secondary | ICD-10-CM | POA: Diagnosis present

## 2019-11-07 DIAGNOSIS — N320 Bladder-neck obstruction: Secondary | ICD-10-CM | POA: Diagnosis present

## 2019-11-07 DIAGNOSIS — A419 Sepsis, unspecified organism: Secondary | ICD-10-CM | POA: Diagnosis not present

## 2019-11-07 DIAGNOSIS — G8929 Other chronic pain: Secondary | ICD-10-CM | POA: Diagnosis present

## 2019-11-07 DIAGNOSIS — Y846 Urinary catheterization as the cause of abnormal reaction of the patient, or of later complication, without mention of misadventure at the time of the procedure: Secondary | ICD-10-CM | POA: Diagnosis present

## 2019-11-07 DIAGNOSIS — Z87440 Personal history of urinary (tract) infections: Secondary | ICD-10-CM

## 2019-11-07 DIAGNOSIS — Z77098 Contact with and (suspected) exposure to other hazardous, chiefly nonmedicinal, chemicals: Secondary | ICD-10-CM | POA: Diagnosis present

## 2019-11-07 DIAGNOSIS — D6959 Other secondary thrombocytopenia: Secondary | ICD-10-CM | POA: Diagnosis present

## 2019-11-07 DIAGNOSIS — Z79899 Other long term (current) drug therapy: Secondary | ICD-10-CM

## 2019-11-07 DIAGNOSIS — N323 Diverticulum of bladder: Secondary | ICD-10-CM | POA: Diagnosis present

## 2019-11-07 DIAGNOSIS — G894 Chronic pain syndrome: Secondary | ICD-10-CM | POA: Diagnosis present

## 2019-11-07 DIAGNOSIS — Z9884 Bariatric surgery status: Secondary | ICD-10-CM

## 2019-11-07 DIAGNOSIS — W19XXXA Unspecified fall, initial encounter: Secondary | ICD-10-CM | POA: Diagnosis present

## 2019-11-07 DIAGNOSIS — R739 Hyperglycemia, unspecified: Secondary | ICD-10-CM | POA: Diagnosis not present

## 2019-11-07 DIAGNOSIS — N390 Urinary tract infection, site not specified: Secondary | ICD-10-CM

## 2019-11-07 DIAGNOSIS — N136 Pyonephrosis: Secondary | ICD-10-CM | POA: Diagnosis present

## 2019-11-07 DIAGNOSIS — M4722 Other spondylosis with radiculopathy, cervical region: Secondary | ICD-10-CM | POA: Diagnosis present

## 2019-11-07 DIAGNOSIS — Z8616 Personal history of COVID-19: Secondary | ICD-10-CM

## 2019-11-07 DIAGNOSIS — B965 Pseudomonas (aeruginosa) (mallei) (pseudomallei) as the cause of diseases classified elsewhere: Secondary | ICD-10-CM | POA: Diagnosis present

## 2019-11-07 DIAGNOSIS — Z886 Allergy status to analgesic agent status: Secondary | ICD-10-CM

## 2019-11-07 DIAGNOSIS — I6340 Cerebral infarction due to embolism of unspecified cerebral artery: Secondary | ICD-10-CM | POA: Diagnosis present

## 2019-11-07 DIAGNOSIS — N138 Other obstructive and reflux uropathy: Secondary | ICD-10-CM | POA: Diagnosis present

## 2019-11-07 DIAGNOSIS — E43 Unspecified severe protein-calorie malnutrition: Secondary | ICD-10-CM | POA: Diagnosis present

## 2019-11-07 DIAGNOSIS — D62 Acute posthemorrhagic anemia: Secondary | ICD-10-CM | POA: Diagnosis present

## 2019-11-07 DIAGNOSIS — Z20822 Contact with and (suspected) exposure to covid-19: Secondary | ICD-10-CM | POA: Diagnosis present

## 2019-11-07 DIAGNOSIS — F17290 Nicotine dependence, other tobacco product, uncomplicated: Secondary | ICD-10-CM | POA: Diagnosis present

## 2019-11-07 DIAGNOSIS — I69354 Hemiplegia and hemiparesis following cerebral infarction affecting left non-dominant side: Secondary | ICD-10-CM

## 2019-11-07 DIAGNOSIS — F4310 Post-traumatic stress disorder, unspecified: Secondary | ICD-10-CM | POA: Diagnosis present

## 2019-11-07 DIAGNOSIS — N185 Chronic kidney disease, stage 5: Secondary | ICD-10-CM | POA: Diagnosis present

## 2019-11-07 DIAGNOSIS — M5412 Radiculopathy, cervical region: Secondary | ICD-10-CM | POA: Diagnosis present

## 2019-11-07 DIAGNOSIS — E871 Hypo-osmolality and hyponatremia: Secondary | ICD-10-CM | POA: Diagnosis not present

## 2019-11-07 DIAGNOSIS — K5900 Constipation, unspecified: Secondary | ICD-10-CM | POA: Diagnosis not present

## 2019-11-07 DIAGNOSIS — N1330 Unspecified hydronephrosis: Secondary | ICD-10-CM

## 2019-11-07 DIAGNOSIS — Z7982 Long term (current) use of aspirin: Secondary | ICD-10-CM

## 2019-11-07 DIAGNOSIS — R627 Adult failure to thrive: Secondary | ICD-10-CM | POA: Diagnosis present

## 2019-11-07 DIAGNOSIS — R579 Shock, unspecified: Secondary | ICD-10-CM | POA: Diagnosis present

## 2019-11-07 DIAGNOSIS — N400 Enlarged prostate without lower urinary tract symptoms: Secondary | ICD-10-CM

## 2019-11-07 DIAGNOSIS — R6521 Severe sepsis with septic shock: Secondary | ICD-10-CM | POA: Diagnosis present

## 2019-11-07 DIAGNOSIS — N184 Chronic kidney disease, stage 4 (severe): Secondary | ICD-10-CM | POA: Diagnosis present

## 2019-11-07 DIAGNOSIS — Z8051 Family history of malignant neoplasm of kidney: Secondary | ICD-10-CM

## 2019-11-07 DIAGNOSIS — M47812 Spondylosis without myelopathy or radiculopathy, cervical region: Secondary | ICD-10-CM | POA: Diagnosis present

## 2019-11-07 DIAGNOSIS — L89152 Pressure ulcer of sacral region, stage 2: Secondary | ICD-10-CM | POA: Diagnosis present

## 2019-11-07 DIAGNOSIS — C8190 Hodgkin lymphoma, unspecified, unspecified site: Secondary | ICD-10-CM | POA: Diagnosis present

## 2019-11-07 DIAGNOSIS — T8383XA Hemorrhage of genitourinary prosthetic devices, implants and grafts, initial encounter: Secondary | ICD-10-CM | POA: Diagnosis present

## 2019-11-07 DIAGNOSIS — E872 Acidosis: Secondary | ICD-10-CM | POA: Diagnosis present

## 2019-11-07 DIAGNOSIS — N135 Crossing vessel and stricture of ureter without hydronephrosis: Secondary | ICD-10-CM

## 2019-11-07 DIAGNOSIS — B9620 Unspecified Escherichia coli [E. coli] as the cause of diseases classified elsewhere: Secondary | ICD-10-CM | POA: Diagnosis present

## 2019-11-07 DIAGNOSIS — Z8571 Personal history of Hodgkin lymphoma: Secondary | ICD-10-CM

## 2019-11-07 DIAGNOSIS — K651 Peritoneal abscess: Secondary | ICD-10-CM

## 2019-11-07 DIAGNOSIS — N179 Acute kidney failure, unspecified: Secondary | ICD-10-CM | POA: Diagnosis present

## 2019-11-07 LAB — URINALYSIS, ROUTINE W REFLEX MICROSCOPIC
Bilirubin Urine: NEGATIVE
Glucose, UA: NEGATIVE mg/dL
Ketones, ur: NEGATIVE mg/dL
Nitrite: POSITIVE — AB
Protein, ur: 30 mg/dL — AB
Specific Gravity, Urine: 1.012 (ref 1.005–1.030)
WBC, UA: >50 WBC/hpf — ABNORMAL HIGH (ref 0–5)
pH: 6 (ref 5.0–8.0)

## 2019-11-07 LAB — BLOOD GAS, VENOUS
Acid-base deficit: 8.2 mmol/L — ABNORMAL HIGH (ref 0.0–2.0)
Bicarbonate: 18.1 mmol/L — ABNORMAL LOW (ref 20.0–28.0)
O2 Saturation: 18.7 %
Patient temperature: 98.6
pCO2, Ven: 42.4 mmHg — ABNORMAL LOW (ref 44.0–60.0)
pH, Ven: 7.252 (ref 7.250–7.430)
pO2, Ven: 20 mmHg — CL (ref 32.0–45.0)

## 2019-11-07 LAB — PROTIME-INR
INR: 1.2 (ref 0.8–1.2)
Prothrombin Time: 14.9 seconds (ref 11.4–15.2)

## 2019-11-07 LAB — CBC WITH DIFFERENTIAL/PLATELET
Abs Immature Granulocytes: 0.02 10*3/uL (ref 0.00–0.07)
Basophils Absolute: 0 10*3/uL (ref 0.0–0.1)
Basophils Relative: 1 %
Eosinophils Absolute: 0 10*3/uL (ref 0.0–0.5)
Eosinophils Relative: 1 %
HCT: 33.1 % — ABNORMAL LOW (ref 39.0–52.0)
Hemoglobin: 9.8 g/dL — ABNORMAL LOW (ref 13.0–17.0)
Immature Granulocytes: 1 %
Lymphocytes Relative: 4 %
Lymphs Abs: 0.1 10*3/uL — ABNORMAL LOW (ref 0.7–4.0)
MCH: 29.2 pg (ref 26.0–34.0)
MCHC: 29.6 g/dL — ABNORMAL LOW (ref 30.0–36.0)
MCV: 98.5 fL (ref 80.0–100.0)
Monocytes Absolute: 0 10*3/uL — ABNORMAL LOW (ref 0.1–1.0)
Monocytes Relative: 0 %
Neutro Abs: 3.1 10*3/uL (ref 1.7–7.7)
Neutrophils Relative %: 93 %
Platelets: 178 10*3/uL (ref 150–400)
RBC: 3.36 MIL/uL — ABNORMAL LOW (ref 4.22–5.81)
RDW: 20.1 % — ABNORMAL HIGH (ref 11.5–15.5)
WBC: 3.3 10*3/uL — ABNORMAL LOW (ref 4.0–10.5)
nRBC: 0 % (ref 0.0–0.2)

## 2019-11-07 LAB — COMPREHENSIVE METABOLIC PANEL
ALT: 53 U/L — ABNORMAL HIGH (ref 0–44)
AST: 36 U/L (ref 15–41)
Albumin: 2.7 g/dL — ABNORMAL LOW (ref 3.5–5.0)
Alkaline Phosphatase: 180 U/L — ABNORMAL HIGH (ref 38–126)
Anion gap: 13 (ref 5–15)
BUN: 41 mg/dL — ABNORMAL HIGH (ref 6–20)
CO2: 19 mmol/L — ABNORMAL LOW (ref 22–32)
Calcium: 8.1 mg/dL — ABNORMAL LOW (ref 8.9–10.3)
Chloride: 105 mmol/L (ref 98–111)
Creatinine, Ser: 2.92 mg/dL — ABNORMAL HIGH (ref 0.61–1.24)
GFR calc Af Amer: 27 mL/min — ABNORMAL LOW (ref >60)
GFR calc non Af Amer: 23 mL/min — ABNORMAL LOW (ref >60)
Glucose, Bld: 93 mg/dL (ref 70–99)
Potassium: 4.1 mmol/L (ref 3.5–5.1)
Sodium: 137 mmol/L (ref 135–145)
Total Bilirubin: 0.6 mg/dL (ref 0.3–1.2)
Total Protein: 7.5 g/dL (ref 6.5–8.1)

## 2019-11-07 LAB — LACTIC ACID, PLASMA
Lactic Acid, Venous: 4 mmol/L (ref 0.5–1.9)
Lactic Acid, Venous: 4.1 mmol/L (ref 0.5–1.9)

## 2019-11-07 LAB — SARS CORONAVIRUS 2 BY RT PCR (HOSPITAL ORDER, PERFORMED IN ~~LOC~~ HOSPITAL LAB): SARS Coronavirus 2: NEGATIVE

## 2019-11-07 LAB — APTT: aPTT: 31 seconds (ref 24–36)

## 2019-11-07 MED ORDER — SODIUM CHLORIDE 0.9 % IV SOLN
2.0000 g | Freq: Once | INTRAVENOUS | Status: AC
  Administered 2019-11-07: 2 g via INTRAVENOUS
  Filled 2019-11-07: qty 2

## 2019-11-07 MED ORDER — LACTATED RINGERS IV BOLUS (SEPSIS)
250.0000 mL | Freq: Once | INTRAVENOUS | Status: AC
  Administered 2019-11-07: 250 mL via INTRAVENOUS

## 2019-11-07 MED ORDER — LACTATED RINGERS IV SOLN: INTRAVENOUS | Status: AC

## 2019-11-07 MED ORDER — FENTANYL CITRATE (PF) 100 MCG/2ML IJ SOLN
25.0000 ug | Freq: Once | INTRAMUSCULAR | Status: AC
  Administered 2019-11-07: 25 ug via INTRAVENOUS
  Filled 2019-11-07: qty 2

## 2019-11-07 MED ORDER — SODIUM CHLORIDE 0.9 % IV BOLUS: 1000.0000 mL | Freq: Once | INTRAVENOUS | Status: DC

## 2019-11-07 MED ORDER — FENTANYL CITRATE (PF) 100 MCG/2ML IJ SOLN
50.0000 ug | Freq: Once | INTRAMUSCULAR | Status: AC
  Administered 2019-11-07: 50 ug via INTRAVENOUS
  Filled 2019-11-07: qty 2

## 2019-11-07 MED ORDER — VANCOMYCIN HCL IN DEXTROSE 1-5 GM/200ML-% IV SOLN: 1000.0000 mg | Freq: Once | INTRAVENOUS | Status: DC

## 2019-11-07 MED ORDER — METRONIDAZOLE IN NACL 5-0.79 MG/ML-% IV SOLN
500.0000 mg | Freq: Once | INTRAVENOUS | Status: AC
  Administered 2019-11-07: 500 mg via INTRAVENOUS
  Filled 2019-11-07: qty 100

## 2019-11-07 MED ORDER — MIDODRINE HCL 2.5 MG PO TABS
2.5000 mg | ORAL_TABLET | Freq: Three times a day (TID) | ORAL | Status: DC
  Filled 2019-11-07: qty 1

## 2019-11-07 MED ORDER — LACTATED RINGERS IV BOLUS (SEPSIS)
1000.0000 mL | Freq: Once | INTRAVENOUS | Status: AC
  Administered 2019-11-07: 1000 mL via INTRAVENOUS

## 2019-11-07 MED ORDER — MIDODRINE HCL 5 MG PO TABS
2.5000 mg | ORAL_TABLET | Freq: Three times a day (TID) | ORAL | Status: DC
  Administered 2019-11-07: 2.5 mg via ORAL
  Filled 2019-11-07 (×2): qty 1

## 2019-11-07 MED ORDER — LACTATED RINGERS IV BOLUS (SEPSIS)
500.0000 mL | Freq: Once | INTRAVENOUS | Status: AC
  Administered 2019-11-07: 500 mL via INTRAVENOUS

## 2019-11-07 MED ORDER — IOHEXOL 9 MG/ML PO SOLN
ORAL | Status: AC
  Filled 2019-11-07: qty 1000

## 2019-11-07 MED ORDER — NOREPINEPHRINE 4 MG/250ML-% IV SOLN
0.0000 ug/min | INTRAVENOUS | Status: DC
  Administered 2019-11-07: 10 ug/min via INTRAVENOUS
  Administered 2019-11-08: 19 ug/min via INTRAVENOUS
  Filled 2019-11-07 (×3): qty 250

## 2019-11-07 MED ORDER — NOREPINEPHRINE 4 MG/250ML-% IV SOLN: 0.0000 ug/min | INTRAVENOUS | Status: DC

## 2019-11-07 MED ORDER — VANCOMYCIN HCL 1250 MG/250ML IV SOLN
1250.0000 mg | Freq: Once | INTRAVENOUS | Status: AC
  Administered 2019-11-07: 1250 mg via INTRAVENOUS
  Filled 2019-11-07: qty 250

## 2019-11-07 NOTE — ED Triage Notes (Signed)
Per EMS, Pt is coming from [facility] at [facility]. PT spiked a fever today of 103 degrees, given 650mg  of tylenol by staff. On arrival EMS, found RA of 90% and placed on 2L oxygen, pt also had a HR of 130. Pt denies any SOB, and no new pain, endorses N/V for the last 3-4 days. 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

## 2019-11-07 NOTE — ED Provider Notes (Signed)
[facility] DEPT Provider Note   CSN: [id number] Arrival date & time: 11/07/19  1753     History Chief Complaint  Patient presents with  . Code Sepsis    Chase [name] is a 54 y.o. male with a past medical history of CKD, prior embolic stroke with residual left-sided weakness, acute thrombocytopenia related to tickborne illness, recent admission last month presenting to the ED as a code sepsis.  Reports nausea and diarrhea as well as vomiting yesterday.  This improved today.  He had a fever today at his nursing facility and was given Tylenol.  He reports pain all over his body which is baseline for him because of his chronic pain syndrome.  He reports cough productive with mucus as well as shortness of breath.  Denies any chest pain, abdominal pain, worsening numbness and weakness.  Patient has not been vaccinated against Covid but he did have the infection in Sept 2020.  HPI     Past Medical History:  Diagnosis Date  . Cervical radiculopathy    with left sided weakness  . Chemical exposure    service related injury  . Chronic pain   . GIB (gastrointestinal bleeding)   . PTSD (post-traumatic stress disorder)   . Renal disorder    CKD  . Urinary retention    due to bladder outlet obstruction    Patient Active Problem List   Diagnosis Date Noted  . Protein-calorie malnutrition, severe 10/23/2019  . Cerebral embolism with cerebral infarction 10/18/2019  . Acute left-sided weakness 10/17/2019  . CKD (chronic kidney disease), stage IV ([place]) 10/17/2019  . Chronic pain 10/17/2019  . Fall 10/17/2019  . Acute lower UTI 10/17/2019    Past Surgical History:  Procedure Laterality Date  . GASTRIC BYPASS    . HERNIA REPAIR         Family History  Problem Relation Age of Onset  . Renal cancer Father   . Heart disease Paternal Grandfather     Social History   Tobacco Use  . Smoking status: Never Smoker  . Smokeless tobacco: Current  User  Substance Use Topics  . Alcohol use: Never  . Drug use: Never    Home Medications Prior to Admission medications   Medication Sig Start Date End Date Taking? Authorizing Provider  cyanocobalamin (,VITAMIN B-12,) 1000 MCG/ML injection Inject 1,000 mcg into the muscle every 30 (thirty) days.  12/28/13  Yes [provider]  DULoxetine (CYMBALTA) 30 MG capsule Take 30-60 mg by mouth See admin instructions. Take 2 capsules in the morning and 1 capsule in the evening 08/12/17  Yes [provider]  feeding supplement, ENSURE ENLIVE, (ENSURE ENLIVE) LIQD Take 237 mLs by mouth 3 (three) times daily between meals. 10/26/19  Yes Mikhail, [place], DO  ferrous sulfate 325 (65 FE) MG tablet Take 325 mg by mouth daily.   Yes [provider]  gabapentin (NEURONTIN) 300 MG capsule Take 1 capsule (300 mg total) by mouth 2 (two) times daily. 10/26/19  Yes Mikhail, [place], DO  HYDROcodone-acetaminophen (NORCO/VICODIN) 5-325 MG tablet Take 1 tablet by mouth every 4 (four) hours as needed (pain).   Yes [provider]  hydrOXYzine (ATARAX/VISTARIL) 10 MG tablet Take 1 tablet (10 mg total) by mouth 3 (three) times daily as needed for itching or anxiety. 10/26/19  Yes Mikhail, Maryann, DO  midodrine (PROAMATINE) 2.5 MG tablet Take 1 tablet (2.5 mg total) by mouth 3 (three) times daily. 10/26/19  Yes Mikhail, [place],  DO  Multiple Vitamin (MULTIVITAMIN WITH MINERALS) TABS tablet Take 1 tablet by mouth daily. 10/27/19  Yes Mikhail, Maryann, DO  pantoprazole (PROTONIX) 40 MG tablet Take 40 mg by mouth 2 (two) times daily.  06/09/18  Yes [provider]  promethazine (PHENERGAN) 25 MG tablet Take 25 mg by mouth daily as needed for nausea or vomiting.   Yes [provider]  sodium bicarbonate 650 MG tablet Take 3 tablets (1,950 mg total) by mouth 3 (three) times daily. 10/26/19  Yes Mikhail, [place], DO  tamsulosin (FLOMAX) 0.4 MG CAPS capsule Take 0.4 mg by mouth daily.     Yes [provider]  aspirin EC 81 MG EC tablet Take 1 tablet (81 mg total) by mouth daily. Swallow whole. Patient not taking: Reported on 11/07/2019 10/27/19   Cristal Ford, DO  traMADol (ULTRAM) 50 MG tablet Take 1 tablet (50 mg total) by mouth every 6 (six) hours as needed. Patient not taking: Reported on 11/07/2019 10/26/19   Cristal Ford, DO    Allergies    Nsaids  Review of Systems   Review of Systems  Constitutional: Positive for fever. Negative for appetite change and chills.  HENT: Negative for ear pain, rhinorrhea, sneezing and sore throat.   Eyes: Negative for photophobia and visual disturbance.  Respiratory: Positive for cough and shortness of breath. Negative for chest tightness and wheezing.   Cardiovascular: Negative for chest pain and palpitations.  Gastrointestinal: Positive for diarrhea, nausea and vomiting. Negative for abdominal pain, blood in stool and constipation.  Genitourinary: Negative for dysuria, hematuria and urgency.  Musculoskeletal: Positive for myalgias.  Skin: Negative for rash.  Neurological: Negative for dizziness, weakness and light-headedness.    Physical Exam Updated Vital Signs BP (!) 72/44   Pulse (!) 112   Temp 97.8 F (36.6 C) (Axillary)   Resp 16   Ht 5\' 8"  (1.727 m)   Wt 55 kg   SpO2 100%   BMI 18.44 kg/m   Physical Exam Vitals and nursing note reviewed.  Constitutional:      General: He is not in acute distress.    Appearance: He is well-developed.  HENT:     Head: Normocephalic and atraumatic.     Nose: Nose normal.  Eyes:     General: No scleral icterus.       Right eye: No discharge.        Left eye: No discharge.     Conjunctiva/sclera: Conjunctivae normal.  Cardiovascular:     Rate and Rhythm: Regular rhythm. Tachycardia present.     Heart sounds: Normal heart sounds. No murmur heard.  No friction rub. No gallop.   Pulmonary:     Effort: Pulmonary effort is normal. Tachypnea present. No respiratory  distress.     Breath sounds: Normal breath sounds.  Abdominal:     General: Bowel sounds are normal. There is no distension.     Palpations: Abdomen is soft.     Tenderness: There is no abdominal tenderness. There is no guarding.  Musculoskeletal:        General: Normal range of motion.     Cervical back: Normal range of motion and neck supple.  Skin:    General: Skin is warm and dry.     Findings: No rash.  Neurological:     General: No focal deficit present.     Mental Status: He is alert and oriented to person, place, and time.     Cranial Nerves: No cranial nerve deficit.  Motor: Weakness present. No abnormal muscle tone.     Coordination: Coordination normal.     Comments: Decreased strength of the left upper and lower extremity.     ED Results / Procedures / Treatments   Labs (all labs ordered are listed, but only abnormal results are displayed) Labs Reviewed  COMPREHENSIVE METABOLIC PANEL - Abnormal; Notable for the following components:      Result Value   CO2 19 (*)    BUN 41 (*)    Creatinine, Ser 2.92 (*)    Calcium 8.1 (*)    Albumin 2.7 (*)    ALT 53 (*)    Alkaline Phosphatase 180 (*)    GFR calc non Af Amer 23 (*)    GFR calc Af Amer 27 (*)    All other components within normal limits  LACTIC ACID, PLASMA - Abnormal; Notable for the following components:   Lactic Acid, Venous 4.0 (*)    All other components within normal limits  CBC WITH DIFFERENTIAL/PLATELET - Abnormal; Notable for the following components:   WBC 3.3 (*)    RBC 3.36 (*)    Hemoglobin 9.8 (*)    HCT 33.1 (*)    MCHC 29.6 (*)    RDW 20.1 (*)    Lymphs Abs 0.1 (*)    Monocytes Absolute 0.0 (*)    All other components within normal limits  URINALYSIS, ROUTINE W REFLEX MICROSCOPIC - Abnormal; Notable for the following components:   APPearance CLOUDY (*)    Hgb urine dipstick MODERATE (*)    Protein, ur 30 (*)    Nitrite POSITIVE (*)    Leukocytes,Ua LARGE (*)    WBC, UA >50 (*)     Bacteria, UA MANY (*)    All other components within normal limits  BLOOD GAS, VENOUS - Abnormal; Notable for the following components:   pCO2, Ven 42.4 (*)    pO2, Ven 20.0 (*)    Bicarbonate 18.1 (*)    Acid-base deficit 8.2 (*)    All other components within normal limits  SARS CORONAVIRUS 2 BY RT PCR (HOSPITAL ORDER, [facility])  CULTURE, BLOOD (ROUTINE X 2)  CULTURE, BLOOD (ROUTINE X 2)  URINE CULTURE  PROTIME-INR  APTT  LACTIC ACID, PLASMA    EKG EKG Interpretation  Date/Time:  Sunday November 07 2019 19:46:37 EDT Ventricular Rate:  121 PR Interval:    QRS Duration: 76 QT Interval:  296 QTC Calculation: 420 R Axis:   8 Text Interpretation: Sinus tachycardia Low voltage, extremity leads No significant change since last tracing Confirmed by Wandra Arthurs [phone]) on 11/07/2019 10:04:40 PM   Radiology CT Chest Wo Contrast  Result Date: 11/07/2019 CLINICAL DATA:  54 year old male with sepsis.  Fever of 103 today. EXAM: CT CHEST WITHOUT CONTRAST TECHNIQUE: Multidetector CT imaging of the chest was performed following the standard protocol without IV contrast. COMPARISON:  Portable chest earlier today. CT Abdomen and Pelvis today reported separately. Prior chest CTA 10/22/2019. FINDINGS: Cardiovascular: No cardiomegaly or pericardial effusion. Vascular patency is not evaluated in the absence of IV contrast. Mediastinum/Nodes: Negative, no lymphadenopathy is evident. Lungs/Pleura: Bilateral pleural effusions appear largely resolved since last month. Major airways are patent. Improved bilateral lower lobe ventilation. There is only mild linear atelectasis or scarring in the lateral basal segment of the left lower lobe. There is a tiny calcified granuloma in the right lower lobe. Upper Abdomen: Reported separately today. Musculoskeletal: Stable thoracic spine degeneration. Mildly displaced  fractures of the posterior right 9th rib and right 10th rib costovertebral  junction are redemonstrated. The left lateral 7th through 9th rib fractures remain largely nondisplaced. No new osseous abnormality identified. IMPRESSION: 1. No acute or inflammatory process in the non-contrast chest. Bilateral pleural effusions have resolved since last month. 2. Bilateral lower rib fractures, stable from last month. 3. CT Abdomen and Pelvis today reported separately. Electronically Signed   By: Genevie Ann M.D.   On: 11/07/2019 22:55   DG Chest Portable 1 View  Result Date: 11/07/2019 CLINICAL DATA:  54 year old male with sepsis, fever of 103 today. 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 EXAM: PORTABLE CHEST 1 VIEW COMPARISON:  CTA chest 10/22/2019 and earlier. FINDINGS: Portable AP semi upright view at 1912 hours. Stable somewhat low lung volumes. No pleural effusion is evident today. Allowing for portable technique the lungs are clear. Mediastinal contours remain normal. Visualized tracheal air column is within normal limits. Negative visible bowel gas pattern. Stable surgical clips in the right upper quadrant. No acute osseous abnormality identified. IMPRESSION: No acute cardiopulmonary abnormality. Electronically Signed   By: Genevie Ann M.D.   On: 11/07/2019 19:23    Procedures .Critical Care Performed by: Delia Heady, PA-C Authorized by: Delia Heady, PA-C   Critical care provider statement:    Critical care time (minutes):  45   Critical care was necessary to treat or prevent imminent or life-threatening deterioration of the following conditions:  Cardiac failure, circulatory failure, respiratory failure, sepsis, renal failure and shock   Critical care was time spent personally by me on the following activities:  Development of treatment plan with patient or surrogate, discussions with consultants, evaluation of patient's response to treatment, examination of patient, obtaining history from patient or surrogate, ordering and performing treatments and  interventions, ordering and review of laboratory studies, ordering and review of radiographic studies, pulse oximetry, re-evaluation of patient's condition and review of old charts   I assumed direction of critical care for this patient from another provider in my specialty: no     (including critical care time)  Medications Ordered in ED Medications  lactated ringers infusion ( Intravenous New Bag/Given 11/07/19 2203)  iohexol (OMNIPAQUE) 9 MG/ML oral solution (has no administration in time range)  norepinephrine (LEVOPHED) 4mg  in 283mL premix infusion (10 mcg/min Intravenous New Bag/Given 11/07/19 2304)  midodrine (PROAMATINE) tablet 2.5 mg (has no administration in time range)  ceFEPIme (MAXIPIME) 2 g in sodium chloride 0.9 % 100 mL IVPB (0 g Intravenous Stopped 11/07/19 2009)  metroNIDAZOLE (FLAGYL) IVPB 500 mg (0 mg Intravenous Stopped 11/07/19 2325)  lactated ringers bolus 1,000 mL (0 mLs Intravenous Stopped 11/07/19 2114)    And  lactated ringers bolus 500 mL (0 mLs Intravenous Stopped 11/07/19 2154)    And  lactated ringers bolus 250 mL (0 mLs Intravenous Stopped 11/07/19 2137)  fentaNYL (SUBLIMAZE) injection 50 mcg (50 mcg Intravenous Given 11/07/19 1924)  vancomycin (VANCOREADY) IVPB 1250 mg/250 mL (0 mg Intravenous Stopped 11/07/19 2154)  fentaNYL (SUBLIMAZE) injection 25 mcg (25 mcg Intravenous Given 11/07/19 2158)  fentaNYL (SUBLIMAZE) injection 25 mcg (25 mcg Intravenous Given 11/07/19 2321)    ED Course  I have reviewed the triage vital signs and the nursing notes.  Pertinent labs & imaging results that were available during my care of the patient were reviewed by me and considered in my medical decision making (see chart for details).  Clinical Course as of Nov 07 2327  Nancy Fetter Nov 07, 2019  2314 Will start on Levophed.  BP(!): 68/45 [HK]  2314 Lactic Acid, Venous(!!): 4.0 [HK]  2314 SARS Coronavirus 2: NEGATIVE [HK]    Clinical Course User Index [HK] Delia Heady, PA-C   MDM  Rules/Calculators/A&P                          54 year old male with past medical history of CKD, prior embolic stroke with residual left-sided weakness, recent admission last month presenting to the ED as a code sepsis.  Patient had nausea, vomiting and diarrhea yesterday.  Woke up this morning febrile and was given Tylenol at his nursing facility.  Reports pain all over his body which is baseline for him because of his chronic pain syndrome.  Reports cough productive with mucus as well as shortness of breath.  Denies chest pain, abdominal pain.  He has had a Foley catheter in since his admission last month.  He has not been vaccinated against Covid but did have the infection in September 2020.  On exam patient is warm to touch, abdomen is soft, nontender nondistended.  Lungs are clear to auscultation bilaterally.  He is on 4 L of oxygen via nasal cannula.  He does not wear oxygen at baseline.  EKG shows sinus tachycardia, no changes from prior tracings.  Chest x-ray is unremarkable.  Initial lab work significant for creatinine of 2.9 which is at baseline.  Lactic elevated at 4.  No leukocytosis.  Urinalysis with nitrites, leukocytes and pyuria as well as many bacteria.  Covid test is negative.  Blood cultures were obtained.  Patient initially hypotensive to the 36O systolic.  He states that his baseline is in the 90s, he is on midodrine 3 times a day. Patient mentating well here.  He was given fluids 30 cc/kg but remains hypotensive to 29U systolic.  CT of the chest is negative for acute abnormality.  CT of the abdomen pelvis shows IMPRESSION: 1. Bilateral hydronephrosis with abnormal urinary bladder wall thickening suggesting UTI, but also there is either a thick central septation which compartmentalizes the bladder into two regions, or an equal sized complex fluid collection adherent to the bladder dome. See sagittal image 95. Follow-up Pelvis CT Cystogram (CT of the pelvis after dilute contrast is  instilled into the urinary bladder via the Foley catheter) would be valuable to determine whether the two fluid spaces communicate.  He was started on Levophed and will be admitted to critical care for septic shock I believe in the setting of UTI. He refuses central line placement.   Portions of this note were generated with [employer]. Dictation errors may occur despite best attempts at proofreading.  Final Clinical Impression(s) / ED Diagnoses Final diagnoses:  Septic shock ([place])  Lower urinary tract infectious disease    Rx / DC Orders ED Discharge Orders    None       Delia Heady, PA-C 11/07/19 2344    Drenda Freeze, MD 11/10/19 [phone]

## 2019-11-07 NOTE — Progress Notes (Signed)
eLink Physician-Brief Progress Note Patient Name: Chase [name] DOB: 03-03-1966 MRN: [id number]   Date of Service  11/07/2019  HPI/Events of Note  [facility] ED hand off: 54 yr old male from [facility], chronic foley, Left hemiplegia from CVA, CKD now with 1) Uroseptic shock. CxR no PNA. Covid neg. s/p EGDT. MAP < 65. going on levophed. asking [place] to see him . told to drop a central line meantime.   Data: Reviewed. LA 4. UA Bacteria, many wbc. CxR neg. Hg stable 9.8, Cr 2.9.   eICU Interventions  - follow LA. Follow UOP - follow cultures. - bed side ICU team notified . - need VTE prophylaxis. - asp precautions.      Intervention Category Major Interventions: Shock - evaluation and management;Sepsis - evaluation and management Intermediate Interventions: Communication with other healthcare providers and/or family Evaluation Type: New Patient Evaluation  Chase [name] 11/07/2019, 10:12 PM

## 2019-11-07 NOTE — Progress Notes (Signed)
A consult was received from an ED provider for vancomycin + cefepime per pharmacy dosing.  The patient's profile has been reviewed for ht/wt/allergies/indication/available labs.    A one time order has been placed for cefepime 2 g IV once + vancomycin 1250 mg IV once.    Further antibiotics/pharmacy consults should be ordered by admitting physician if indicated.                       Thank you, Lenis Noon, PharmD 11/07/2019  6:58 PM

## 2019-11-08 ENCOUNTER — Encounter (HOSPITAL_COMMUNITY): Payer: Self-pay | Admitting: Pulmonary Disease

## 2019-11-08 ENCOUNTER — Inpatient Hospital Stay (HOSPITAL_COMMUNITY): Payer: No Typology Code available for payment source

## 2019-11-08 DIAGNOSIS — N136 Pyonephrosis: Secondary | ICD-10-CM | POA: Diagnosis present

## 2019-11-08 DIAGNOSIS — N179 Acute kidney failure, unspecified: Secondary | ICD-10-CM | POA: Diagnosis present

## 2019-11-08 DIAGNOSIS — Z681 Body mass index (BMI) 19 or less, adult: Secondary | ICD-10-CM | POA: Diagnosis not present

## 2019-11-08 DIAGNOSIS — E871 Hypo-osmolality and hyponatremia: Secondary | ICD-10-CM | POA: Diagnosis not present

## 2019-11-08 DIAGNOSIS — N401 Enlarged prostate with lower urinary tract symptoms: Secondary | ICD-10-CM | POA: Diagnosis present

## 2019-11-08 DIAGNOSIS — R627 Adult failure to thrive: Secondary | ICD-10-CM | POA: Diagnosis present

## 2019-11-08 DIAGNOSIS — Y846 Urinary catheterization as the cause of abnormal reaction of the patient, or of later complication, without mention of misadventure at the time of the procedure: Secondary | ICD-10-CM | POA: Diagnosis present

## 2019-11-08 DIAGNOSIS — M4722 Other spondylosis with radiculopathy, cervical region: Secondary | ICD-10-CM | POA: Diagnosis present

## 2019-11-08 DIAGNOSIS — I69354 Hemiplegia and hemiparesis following cerebral infarction affecting left non-dominant side: Secondary | ICD-10-CM | POA: Diagnosis not present

## 2019-11-08 DIAGNOSIS — E872 Acidosis: Secondary | ICD-10-CM | POA: Diagnosis present

## 2019-11-08 DIAGNOSIS — N390 Urinary tract infection, site not specified: Secondary | ICD-10-CM | POA: Diagnosis present

## 2019-11-08 DIAGNOSIS — E43 Unspecified severe protein-calorie malnutrition: Secondary | ICD-10-CM | POA: Diagnosis present

## 2019-11-08 DIAGNOSIS — N323 Diverticulum of bladder: Secondary | ICD-10-CM | POA: Diagnosis present

## 2019-11-08 DIAGNOSIS — K651 Peritoneal abscess: Secondary | ICD-10-CM | POA: Diagnosis present

## 2019-11-08 DIAGNOSIS — T8383XA Hemorrhage of genitourinary prosthetic devices, implants and grafts, initial encounter: Secondary | ICD-10-CM | POA: Diagnosis present

## 2019-11-08 DIAGNOSIS — A419 Sepsis, unspecified organism: Secondary | ICD-10-CM | POA: Diagnosis present

## 2019-11-08 DIAGNOSIS — Z20822 Contact with and (suspected) exposure to covid-19: Secondary | ICD-10-CM | POA: Diagnosis present

## 2019-11-08 DIAGNOSIS — R6521 Severe sepsis with septic shock: Secondary | ICD-10-CM | POA: Diagnosis present

## 2019-11-08 DIAGNOSIS — D62 Acute posthemorrhagic anemia: Secondary | ICD-10-CM | POA: Diagnosis present

## 2019-11-08 DIAGNOSIS — G894 Chronic pain syndrome: Secondary | ICD-10-CM | POA: Diagnosis present

## 2019-11-08 DIAGNOSIS — N184 Chronic kidney disease, stage 4 (severe): Secondary | ICD-10-CM | POA: Diagnosis present

## 2019-11-08 DIAGNOSIS — F4310 Post-traumatic stress disorder, unspecified: Secondary | ICD-10-CM | POA: Diagnosis present

## 2019-11-08 DIAGNOSIS — N321 Vesicointestinal fistula: Secondary | ICD-10-CM | POA: Diagnosis not present

## 2019-11-08 DIAGNOSIS — R579 Shock, unspecified: Secondary | ICD-10-CM | POA: Diagnosis present

## 2019-11-08 DIAGNOSIS — L89152 Pressure ulcer of sacral region, stage 2: Secondary | ICD-10-CM | POA: Diagnosis present

## 2019-11-08 DIAGNOSIS — N138 Other obstructive and reflux uropathy: Secondary | ICD-10-CM | POA: Diagnosis present

## 2019-11-08 DIAGNOSIS — R338 Other retention of urine: Secondary | ICD-10-CM | POA: Diagnosis present

## 2019-11-08 LAB — CBC WITH DIFFERENTIAL/PLATELET
Abs Immature Granulocytes: 0.51 10*3/uL — ABNORMAL HIGH (ref 0.00–0.07)
Basophils Absolute: 0.1 10*3/uL (ref 0.0–0.1)
Basophils Relative: 0 %
Eosinophils Absolute: 0 10*3/uL (ref 0.0–0.5)
Eosinophils Relative: 0 %
HCT: 25 % — ABNORMAL LOW (ref 39.0–52.0)
Hemoglobin: 7.6 g/dL — ABNORMAL LOW (ref 13.0–17.0)
Immature Granulocytes: 2 %
Lymphocytes Relative: 1 %
Lymphs Abs: 0.2 10*3/uL — ABNORMAL LOW (ref 0.7–4.0)
MCH: 29.5 pg (ref 26.0–34.0)
MCHC: 30.4 g/dL (ref 30.0–36.0)
MCV: 96.9 fL (ref 80.0–100.0)
Monocytes Absolute: 0.9 10*3/uL (ref 0.1–1.0)
Monocytes Relative: 3 %
Neutro Abs: 25.9 10*3/uL — ABNORMAL HIGH (ref 1.7–7.7)
Neutrophils Relative %: 94 %
Platelets: 163 10*3/uL (ref 150–400)
RBC: 2.58 MIL/uL — ABNORMAL LOW (ref 4.22–5.81)
RDW: 20 % — ABNORMAL HIGH (ref 11.5–15.5)
WBC: 27.6 10*3/uL — ABNORMAL HIGH (ref 4.0–10.5)
nRBC: 0 % (ref 0.0–0.2)

## 2019-11-08 LAB — BASIC METABOLIC PANEL
Anion gap: 14 (ref 5–15)
Anion gap: 12 (ref 5–15)
BUN: 45 mg/dL — ABNORMAL HIGH (ref 6–20)
BUN: 53 mg/dL — ABNORMAL HIGH (ref 6–20)
CO2: 13 mmol/L — ABNORMAL LOW (ref 22–32)
CO2: 13 mmol/L — ABNORMAL LOW (ref 22–32)
Calcium: 7 mg/dL — ABNORMAL LOW (ref 8.9–10.3)
Calcium: 6.9 mg/dL — ABNORMAL LOW (ref 8.9–10.3)
Chloride: 101 mmol/L (ref 98–111)
Chloride: 109 mmol/L (ref 98–111)
Creatinine, Ser: 3.06 mg/dL — ABNORMAL HIGH (ref 0.61–1.24)
Creatinine, Ser: 3.09 mg/dL — ABNORMAL HIGH (ref 0.61–1.24)
GFR calc Af Amer: 25 mL/min — ABNORMAL LOW (ref >60)
GFR calc Af Amer: 25 mL/min — ABNORMAL LOW (ref >60)
GFR calc non Af Amer: 22 mL/min — ABNORMAL LOW (ref >60)
GFR calc non Af Amer: 22 mL/min — ABNORMAL LOW (ref >60)
Glucose, Bld: 149 mg/dL — ABNORMAL HIGH (ref 70–99)
Glucose, Bld: 187 mg/dL — ABNORMAL HIGH (ref 70–99)
Potassium: 3.8 mmol/L (ref 3.5–5.1)
Potassium: 4.3 mmol/L (ref 3.5–5.1)
Sodium: 128 mmol/L — ABNORMAL LOW (ref 135–145)
Sodium: 134 mmol/L — ABNORMAL LOW (ref 135–145)

## 2019-11-08 LAB — BLOOD CULTURE ID PANEL (REFLEXED) - BCID2
A.calcoaceticus-baumannii: NOT DETECTED
Bacteroides fragilis: NOT DETECTED
CTX-M ESBL: NOT DETECTED
Candida albicans: NOT DETECTED
Candida auris: NOT DETECTED
Candida glabrata: NOT DETECTED
Candida krusei: NOT DETECTED
Candida parapsilosis: NOT DETECTED
Candida tropicalis: NOT DETECTED
Carbapenem resist OXA 48 LIKE: NOT DETECTED
Carbapenem resistance IMP: NOT DETECTED
Carbapenem resistance KPC: NOT DETECTED
Carbapenem resistance NDM: NOT DETECTED
Carbapenem resistance VIM: NOT DETECTED
Cryptococcus neoformans/gattii: NOT DETECTED
Enterobacter cloacae complex: NOT DETECTED
Enterobacterales: DETECTED — AB
Enterococcus Faecium: NOT DETECTED
Enterococcus faecalis: NOT DETECTED
Escherichia coli: DETECTED — AB
Haemophilus influenzae: NOT DETECTED
Klebsiella aerogenes: NOT DETECTED
Klebsiella oxytoca: NOT DETECTED
Klebsiella pneumoniae: NOT DETECTED
Listeria monocytogenes: NOT DETECTED
Neisseria meningitidis: NOT DETECTED
Proteus species: DETECTED — AB
Pseudomonas aeruginosa: NOT DETECTED
Salmonella species: NOT DETECTED
Serratia marcescens: NOT DETECTED
Staphylococcus aureus (BCID): NOT DETECTED
Staphylococcus epidermidis: NOT DETECTED
Staphylococcus lugdunensis: NOT DETECTED
Staphylococcus species: NOT DETECTED
Stenotrophomonas maltophilia: NOT DETECTED
Streptococcus agalactiae: NOT DETECTED
Streptococcus pneumoniae: NOT DETECTED
Streptococcus pyogenes: NOT DETECTED
Streptococcus species: NOT DETECTED

## 2019-11-08 LAB — LACTIC ACID, PLASMA
Lactic Acid, Venous: 4.9 mmol/L (ref 0.5–1.9)
Lactic Acid, Venous: 4.4 mmol/L (ref 0.5–1.9)

## 2019-11-08 LAB — GLUCOSE, CAPILLARY
Glucose-Capillary: 164 mg/dL — ABNORMAL HIGH (ref 70–99)
Glucose-Capillary: 143 mg/dL — ABNORMAL HIGH (ref 70–99)
Glucose-Capillary: 120 mg/dL — ABNORMAL HIGH (ref 70–99)
Glucose-Capillary: 142 mg/dL — ABNORMAL HIGH (ref 70–99)
Glucose-Capillary: 128 mg/dL — ABNORMAL HIGH (ref 70–99)

## 2019-11-08 LAB — PHOSPHORUS: Phosphorus: 4.4 mg/dL (ref 2.5–4.6)

## 2019-11-08 LAB — MAGNESIUM: Magnesium: 1.3 mg/dL — ABNORMAL LOW (ref 1.7–2.4)

## 2019-11-08 LAB — MRSA PCR SCREENING: MRSA by PCR: NEGATIVE

## 2019-11-08 MED ORDER — ORAL CARE MOUTH RINSE
15.0000 mL | Freq: Two times a day (BID) | OROMUCOSAL | Status: DC
  Administered 2019-11-09 – 2019-11-10 (×2): 15 mL via OROMUCOSAL

## 2019-11-08 MED ORDER — SODIUM CHLORIDE 0.9 % IV SOLN
2.0000 g | INTRAVENOUS | Status: DC
  Administered 2019-11-08 – 2019-11-09 (×2): 2 g via INTRAVENOUS
  Filled 2019-11-08 (×2): qty 20

## 2019-11-08 MED ORDER — ORAL CARE MOUTH RINSE: 15.0000 mL | Freq: Two times a day (BID) | OROMUCOSAL | Status: DC

## 2019-11-08 MED ORDER — FERROUS SULFATE 325 (65 FE) MG PO TABS
325.0000 mg | ORAL_TABLET | Freq: Every day | ORAL | Status: DC
  Administered 2019-11-11 – 2019-11-18 (×4): 325 mg via ORAL
  Filled 2019-11-08 (×11): qty 1

## 2019-11-08 MED ORDER — PANTOPRAZOLE SODIUM 40 MG PO TBEC
40.0000 mg | DELAYED_RELEASE_TABLET | Freq: Two times a day (BID) | ORAL | Status: DC
  Administered 2019-11-08 – 2019-11-19 (×23): 40 mg via ORAL
  Filled 2019-11-08 (×23): qty 1

## 2019-11-08 MED ORDER — DOCUSATE SODIUM 100 MG PO CAPS
100.0000 mg | ORAL_CAPSULE | Freq: Two times a day (BID) | ORAL | Status: DC | PRN
  Administered 2019-11-10 – 2019-11-17 (×4): 100 mg via ORAL
  Filled 2019-11-08 (×4): qty 1

## 2019-11-08 MED ORDER — NOREPINEPHRINE 16 MG/250ML-% IV SOLN
0.0000 ug/min | INTRAVENOUS | Status: DC
  Administered 2019-11-08: 12 ug/min via INTRAVENOUS
  Filled 2019-11-08: qty 250

## 2019-11-08 MED ORDER — HYDROCODONE-ACETAMINOPHEN 5-325 MG PO TABS
1.0000 | ORAL_TABLET | ORAL | Status: DC | PRN
  Administered 2019-11-08 – 2019-11-19 (×59): 1 via ORAL
  Filled 2019-11-08 (×59): qty 1

## 2019-11-08 MED ORDER — DULOXETINE HCL 30 MG PO CPEP: 30.0000 mg | ORAL_CAPSULE | ORAL | Status: DC

## 2019-11-08 MED ORDER — FENTANYL CITRATE (PF) 100 MCG/2ML IJ SOLN
25.0000 ug | Freq: Once | INTRAMUSCULAR | Status: AC
  Administered 2019-11-08: 25 ug via INTRAVENOUS
  Filled 2019-11-08: qty 2

## 2019-11-08 MED ORDER — KATE FARMS STANDARD 1.4 PO LIQD
325.0000 mL | Freq: Two times a day (BID) | ORAL | Status: DC
  Administered 2019-11-11 – 2019-11-15 (×6): 325 mL via ORAL
  Filled 2019-11-08 (×20): qty 325

## 2019-11-08 MED ORDER — LACTATED RINGERS IV BOLUS
1000.0000 mL | Freq: Once | INTRAVENOUS | Status: AC
  Administered 2019-11-08: 1000 mL via INTRAVENOUS

## 2019-11-08 MED ORDER — MIDAZOLAM HCL 2 MG/2ML IJ SOLN
0.5000 mg | Freq: Once | INTRAMUSCULAR | Status: AC
  Administered 2019-11-08: 0.5 mg via INTRAVENOUS
  Filled 2019-11-08: qty 2

## 2019-11-08 MED ORDER — ADULT MULTIVITAMIN W/MINERALS CH
1.0000 | ORAL_TABLET | Freq: Every day | ORAL | Status: DC
  Administered 2019-11-08 – 2019-11-19 (×12): 1 via ORAL
  Filled 2019-11-08 (×12): qty 1

## 2019-11-08 MED ORDER — SODIUM CHLORIDE 0.9% FLUSH
10.0000 mL | Freq: Two times a day (BID) | INTRAVENOUS | Status: DC
  Administered 2019-11-08: 10 mL
  Administered 2019-11-08 – 2019-11-09 (×2): 40 mL
  Administered 2019-11-10: 10 mL

## 2019-11-08 MED ORDER — CHLORHEXIDINE GLUCONATE CLOTH 2 % EX PADS
6.0000 | MEDICATED_PAD | Freq: Every day | CUTANEOUS | Status: DC
  Administered 2019-11-10: 6 via TOPICAL

## 2019-11-08 MED ORDER — CHLORHEXIDINE GLUCONATE CLOTH 2 % EX PADS
6.0000 | MEDICATED_PAD | Freq: Every day | CUTANEOUS | Status: DC
  Administered 2019-11-08: 6 via TOPICAL

## 2019-11-08 MED ORDER — HYDROCODONE-ACETAMINOPHEN 5-325 MG PO TABS
1.0000 | ORAL_TABLET | Freq: Once | ORAL | Status: AC
  Administered 2019-11-08: 1 via ORAL
  Filled 2019-11-08: qty 1

## 2019-11-08 MED ORDER — INSULIN ASPART 100 UNIT/ML ~~LOC~~ SOLN
0.0000 [IU] | Freq: Three times a day (TID) | SUBCUTANEOUS | Status: DC
  Administered 2019-11-08: 2 [IU] via SUBCUTANEOUS
  Administered 2019-11-08 – 2019-11-09 (×2): 1 [IU] via SUBCUTANEOUS
  Administered 2019-11-10: 3 [IU] via SUBCUTANEOUS
  Administered 2019-11-10 – 2019-11-13 (×2): 1 [IU] via SUBCUTANEOUS
  Administered 2019-11-13: 2 [IU] via SUBCUTANEOUS
  Administered 2019-11-14: 3 [IU] via SUBCUTANEOUS

## 2019-11-08 MED ORDER — SODIUM CHLORIDE 0.9 % IV SOLN
2.0000 g | INTRAVENOUS | Status: DC
  Filled 2019-11-08: qty 2

## 2019-11-08 MED ORDER — DULOXETINE HCL 60 MG PO CPEP
60.0000 mg | ORAL_CAPSULE | Freq: Every day | ORAL | Status: DC
  Administered 2019-11-08 – 2019-11-19 (×12): 60 mg via ORAL
  Filled 2019-11-08 (×2): qty 1
  Filled 2019-11-08: qty 2
  Filled 2019-11-08 (×3): qty 1
  Filled 2019-11-08: qty 2
  Filled 2019-11-08 (×3): qty 1
  Filled 2019-11-08: qty 2
  Filled 2019-11-08: qty 1

## 2019-11-08 MED ORDER — HYDROCORTISONE NA SUCCINATE PF 100 MG IJ SOLR
50.0000 mg | Freq: Four times a day (QID) | INTRAMUSCULAR | Status: DC
  Administered 2019-11-08 – 2019-11-10 (×11): 50 mg via INTRAVENOUS
  Filled 2019-11-08 (×6): qty 2
  Filled 2019-11-08: qty 1
  Filled 2019-11-08 (×5): qty 2

## 2019-11-08 MED ORDER — SODIUM CHLORIDE 0.9 % IV SOLN: 1.0000 g | Freq: Two times a day (BID) | INTRAVENOUS | Status: DC

## 2019-11-08 MED ORDER — HEPARIN SODIUM (PORCINE) 5000 UNIT/ML IJ SOLN
5000.0000 [IU] | Freq: Three times a day (TID) | INTRAMUSCULAR | Status: DC
  Administered 2019-11-08 (×3): 5000 [IU] via SUBCUTANEOUS
  Filled 2019-11-08 (×4): qty 1

## 2019-11-08 MED ORDER — POLYETHYLENE GLYCOL 3350 17 G PO PACK: 17.0000 g | PACK | Freq: Every day | ORAL | Status: DC | PRN

## 2019-11-08 MED ORDER — LACTATED RINGERS IV BOLUS: 500.0000 mL | Freq: Once | INTRAVENOUS | Status: DC

## 2019-11-08 MED ORDER — GABAPENTIN 300 MG PO CAPS
300.0000 mg | ORAL_CAPSULE | Freq: Two times a day (BID) | ORAL | Status: DC
  Administered 2019-11-08 – 2019-11-19 (×23): 300 mg via ORAL
  Filled 2019-11-08 (×23): qty 1

## 2019-11-08 MED ORDER — VANCOMYCIN HCL 500 MG/100ML IV SOLN: 500.0000 mg | INTRAVENOUS | Status: DC

## 2019-11-08 MED ORDER — SODIUM CHLORIDE 0.9% FLUSH: 10.0000 mL | INTRAVENOUS | Status: DC | PRN

## 2019-11-08 MED ORDER — MAGNESIUM SULFATE 4 GM/100ML IV SOLN
4.0000 g | Freq: Once | INTRAVENOUS | Status: AC
  Administered 2019-11-08: 4 g via INTRAVENOUS
  Filled 2019-11-08: qty 100

## 2019-11-08 NOTE — Progress Notes (Signed)
Initial Nutrition Assessment  DOCUMENTATION CODES:   Severe malnutrition in context of chronic illness, Underweight  INTERVENTION:  - will order [employer] 1.4 po BID, each supplement provides 455 kcal and 20 grams protein.  NUTRITION DIAGNOSIS:   Severe Malnutrition related to chronic illness as evidenced by severe fat depletion, severe muscle depletion.  GOAL:   Patient will meet greater than or equal to 90% of their needs  MONITOR:   PO intake, Supplement acceptance, Labs, Weight trends  REASON FOR ASSESSMENT:   Malnutrition Screening Tool  ASSESSMENT:   54 year-old male with medical history of BPH with chronic indwelling foley catheter, recent CVA (admitted 7/18-7/27), CKD, chronic pain, and Hodgkin's lymphoma (treated with ABVD and XRT). He presented from rehab facility with low BP and fever.  Diet changed from Soft to Regular prior to lunch today. Patient is from a rehab facility where he has been since discharge from [facility] on 7/27. Patient discusses the progress he has made in BLE movement and LUE movement since that time and his looking forward to returning. He is R-handed and able to use this hand without any weakness or difficulty to self feed.  While at facility, he has been focusing on eating well in an effort to gain weight. He is lactose-intolerant. He denies any chewing or swallowing issues. He states that the scale at rehab facility was not properly functioning for an unknown amount of time but that the last reliable weight for him was 127 lb.   Per chart review, weight on 7/18 was 122 lb and weight yesterday was 121 lb. Weight on the white board in his room written as 57 kg (125 lb).   Much of the time in patient's room was spent providing active listening.  Patient denies any nutrition-related concerns or needs at this time. Would like RD to pass on recommendation for cocktail sauce to be available.    Labs reviewed; CBG: 164 mg/dl, Na: 128 mmol/l, BUN:  45 mg/dl, creatinine: 3.06 mg/dl, Ca: 7 mg/dl, Mg: 1.3 mg/dl, GFR: 22 ml/min.  Medications reviewed; 325 mg ferrous sulfate/day, 50 mg IV solu-cortef QID, sliding scale novolog, 4 g IV Mg sulfate x1 run 8/9, 40 mg oral protonix BID. IVF; LR @ 150 ml/hr (8/8 at 1900 to 8/9 at 1500).    NUTRITION - FOCUSED PHYSICAL EXAM:    Most Recent Value  Orbital Region Severe depletion  Upper Arm Region Severe depletion  Thoracic and Lumbar Region Severe depletion  Buccal Region Moderate depletion  Temple Region Severe depletion  Clavicle Bone Region Severe depletion  Clavicle and Acromion Bone Region Severe depletion  Scapular Bone Region Unable to assess  Dorsal Hand Moderate depletion  Patellar Region Severe depletion  Anterior Thigh Region Severe depletion  Posterior Calf Region Severe depletion  Edema (RD Assessment) Mild  [BLE]  Hair Reviewed  Eyes Unable to assess  Mouth Reviewed  Skin Reviewed  Nails Reviewed       Diet Order:   Diet Order            Diet regular Room service appropriate? Yes; Fluid consistency: Thin  Diet effective now                 EDUCATION NEEDS:   No education needs have been identified at this time  Skin:  Skin Assessment: Skin Integrity Issues: Skin Integrity Issues:: Stage I Stage I: coccyx (per [facility] note)  Last BM:  8/9  Height:   Ht Readings from Last 1  Encounters:  11/07/19 5\' 8"  (1.727 m)    Weight:   Wt Readings from Last 1 Encounters:  11/07/19 55 kg    Estimated Nutritional Needs:  Kcal:  2100-2300 kcal Protein:  100-110 grams Fluid:  >/= 2.2 L/day     Jarome Matin, MS, RD, LDN, CNSC Inpatient Clinical Dietitian RD pager # available in [place]  After hours/weekend pager # available in [facility]

## 2019-11-08 NOTE — H&P (Signed)
NAME:  Chase [name], MRN:  [id number], DOB:  01/06/1966, LOS: 0 ADMISSION DATE:  11/07/2019, CONSULTATION DATE:  11/07/2019 REFERRING MD:  D[facility] ED, CHIEF COMPLAINT:  Septic shock   Brief History   54 yo M with a history of BPH with chronic indwelling foley catheter, recent CVA, CKD, chronic pain, history of Hodgkin's lymphoma (treated with ABVD and radiation), who presented from his facility for low blood pressure and fever.   History of present illness   Patient was recently admitted (7/18-7/27) for acute CVA and discharged to SNF.  Patient has a chronic indwelling foley due to BPH and large bladder diverticulum, and also admits to having several UTIs in the past. On interview, patient is alert and oriented x4 and does not feel like his blood pressure is low, but was told to go to the ED from the staff at his facility.  Has some pain on palpation of lower abdomen but no flank tenderness.  Also had pain on his sacrum, which has been there for weeks.    Past Medical History  BPH and bladder diverticulum CVA CKD Chronic pain on opiates (norco) History of Hodgkin's lymphoma, treated in 2010 PTSD Anemia History of gastric bypass COVID-19 illness.  Managed as outpatient Sept 2020.   Significant Hospital Events     Consults:  none  Procedures:  L IJ CVC 8/9  Significant Diagnostic Tests:  U/A: suggestive of infection  Micro Data:  Blood cultures  Urine cultures  Antimicrobials:  Vanc/cefepime/flagyl in the ED 8/8.   Vanc/cefepime 8/9>>  Interim history/subjective:    Objective   Blood pressure (!) 74/46, pulse (!) 117, temperature 97.8 F (36.6 C), temperature source Axillary, resp. rate (!) 22, height 5\' 8"  (1.727 m), weight 55 kg, SpO2 99 %.        Intake/Output Summary (Last 24 hours) at 11/08/2019 0029 Last data filed at 11/07/2019 2325 Gross per 24 hour  Intake 2194.11 ml  Output --  Net 2194.11 ml   Filed Weights   11/07/19 1832  Weight: 55 kg     Examination: General: Alert and oriented x4.  No acute distress HENT: no abnormalities.  Lungs: clear bilaterally Cardiovascular: no m/r/g Abdomen: soft, tender in lower abdomen, no flank tenderness Extremities: no edema, muscle wasting Neuro: Alert and oriented x4, 4/5 strength on left leg and left arm GU: foley in place  Resolved Hospital Problem list     Assessment & Plan:  # Septic shock:  Likely urinary source.  Had indweling foley due to urinary retention and bladder diverticulum.  CT A/P without abscess and without signs of pyelonephritis.  Ok to d/c flagyl - vanc/cefepime - L CVC placed. Continued levophed, map goal 60 (BP runs low at baseline and mentating well with MAP as low as 55) - trend lactate - additional 1L fluid at least- not yet fully fluid resuscitated.  Could give more.  # AKI on CKD: prerenal likely from shock.  Though bilateral hydronephrosis suggests ? Obstruction which may chronic due to BPH and diverticulum.  IV fluids, indwelling foley - trend BMPs  Chronic problems: # Recent CVA:  continue ASA # Chronic pain: norco prn (home dose), gabapentin and duloxetine # Failure to thrive: with history of gastric bypass surgery.  nutrition consult.  Thiamine, MV. Mentioned that he lost a lot of weight after getting COVID in Sept 2020.   # Pressure ulcer: stage 2, not infected      Best practice:  Diet: regular soft diet  Pain/Anxiety/Delirium protocol (if indicated): n/a VAP protocol (if indicated): n/a DVT prophylaxis: SQH GI prophylaxis: ppi BID (home) Glucose control: SSI Mobility: PT when able Code Status: Full Family Communication: none Disposition: ICU  Labs   CBC: Recent Labs  Lab 11/07/19 1836  WBC 3.3*  NEUTROABS 3.1  HGB 9.8*  HCT 33.1*  MCV 98.5  PLT 053    Basic Metabolic Panel: Recent Labs  Lab 11/07/19 1836  NA 137  K 4.1  CL 105  CO2 19*  GLUCOSE 93  BUN 41*  CREATININE 2.92*  CALCIUM 8.1*   GFR: Estimated  Creatinine Clearance: 22.2 mL/min (A) (by C-G formula based on SCr of 2.92 mg/dL (H)). Recent Labs  Lab 11/07/19 1836 11/07/19 2036  WBC 3.3*  --   LATICACIDVEN 4.0* 4.1*    Liver Function Tests: Recent Labs  Lab 11/07/19 1836  AST 36  ALT 53*  ALKPHOS 180*  BILITOT 0.6  PROT 7.5  ALBUMIN 2.7*   No results for input(s): LIPASE, AMYLASE in the last 168 hours. No results for input(s): AMMONIA in the last 168 hours.  ABG    Component Value Date/Time   HCO3 18.1 (L) 11/07/2019 1849   TCO2 15 (L) 10/17/2019 1241   ACIDBASEDEF 8.2 (H) 11/07/2019 1849   O2SAT 18.7 11/07/2019 1849     Coagulation Profile: Recent Labs  Lab 11/07/19 1836  INR 1.2    Cardiac Enzymes: No results for input(s): CKTOTAL, CKMB, CKMBINDEX, TROPONINI in the last 168 hours.  HbA1C: Hgb A1c MFr Bld  Date/Time Value Ref Range Status  10/18/2019 12:13 AM 5.4 4.8 - 5.6 % Final    Comment:    (NOTE) Pre diabetes:          5.7%-6.4%  Diabetes:              >6.4%  Glycemic control for   <7.0% adults with diabetes     CBG: No results for input(s): GLUCAP in the last 168 hours.  Review of Systems:   ROS negative except per HPI  Past Medical History  He,  has a past medical history of Cervical radiculopathy, Chemical exposure, Chronic pain, GIB (gastrointestinal bleeding), PTSD (post-traumatic stress disorder), Renal disorder, and Urinary retention.   Surgical History    Past Surgical History:  Procedure Laterality Date  . GASTRIC BYPASS    . HERNIA REPAIR       Social History   reports that he has never smoked. He uses smokeless tobacco. He reports that he does not drink alcohol and does not use drugs.   Family History   His family history includes Heart disease in his paternal grandfather; Renal cancer in his father.   Allergies Allergies  Allergen Reactions  . Nsaids Other (See Comments)    Bleeding GI Ulceration     Home Medications  Prior to Admission medications    Medication Sig Start Date End Date Taking? Authorizing Provider  cyanocobalamin (,VITAMIN B-12,) 1000 MCG/ML injection Inject 1,000 mcg into the muscle every 30 (thirty) days.  12/28/13  Yes [provider]  DULoxetine (CYMBALTA) 30 MG capsule Take 30-60 mg by mouth See admin instructions. Take 2 capsules in the morning and 1 capsule in the evening 08/12/17  Yes [provider]  feeding supplement, ENSURE ENLIVE, (ENSURE ENLIVE) LIQD Take 237 mLs by mouth 3 (three) times daily between meals. 10/26/19  Yes Mikhail, [place], DO  ferrous sulfate 325 (65 FE) MG tablet Take 325 mg by mouth daily.   Yes  [provider]  gabapentin (NEURONTIN) 300 MG capsule Take 1 capsule (300 mg total) by mouth 2 (two) times daily. 10/26/19  Yes Mikhail, [place], DO  HYDROcodone-acetaminophen (NORCO/VICODIN) 5-325 MG tablet Take 1 tablet by mouth every 4 (four) hours as needed (pain).   Yes [provider]  hydrOXYzine (ATARAX/VISTARIL) 10 MG tablet Take 1 tablet (10 mg total) by mouth 3 (three) times daily as needed for itching or anxiety. 10/26/19  Yes Mikhail, Maryann, DO  midodrine (PROAMATINE) 2.5 MG tablet Take 1 tablet (2.5 mg total) by mouth 3 (three) times daily. 10/26/19  Yes Mikhail, [place], DO  Multiple Vitamin (MULTIVITAMIN WITH MINERALS) TABS tablet Take 1 tablet by mouth daily. 10/27/19  Yes Mikhail, Maryann, DO  pantoprazole (PROTONIX) 40 MG tablet Take 40 mg by mouth 2 (two) times daily.  06/09/18  Yes [provider]  promethazine (PHENERGAN) 25 MG tablet Take 25 mg by mouth daily as needed for nausea or vomiting.   Yes [provider]  sodium bicarbonate 650 MG tablet Take 3 tablets (1,950 mg total) by mouth 3 (three) times daily. 10/26/19  Yes Mikhail, Velta [place], DO  tamsulosin (FLOMAX) 0.4 MG CAPS capsule Take 0.4 mg by mouth daily.    Yes [provider]  aspirin EC 81 MG EC tablet Take 1 tablet (81 mg total) by mouth daily. Swallow whole. Patient  not taking: Reported on 11/07/2019 10/27/19   Cristal Ford, DO  traMADol (ULTRAM) 50 MG tablet Take 1 tablet (50 mg total) by mouth every 6 (six) hours as needed. Patient not taking: Reported on 11/07/2019 10/26/19   Cristal Ford, DO     Critical care time: 10

## 2019-11-08 NOTE — Procedures (Signed)
Central Venous Catheter Insertion Procedure Note  Chase [name]  [id number]  [birth year]  Date:11/08/19  Time:2:54 AM   Provider Performing:Aric Jost T Francie Massing   Procedure: Insertion of Non-tunneled Central Venous [phone]) with US guidance (01027)   Indication(s) Medication administration  Consent Risks of the procedure as well as the alternatives and risks of each were explained to the patient and/or caregiver.  Consent for the procedure was obtained and is signed in the bedside chart  Anesthesia Topical only with 1% lidocaine   Timeout Verified patient identification, verified procedure, site/side was marked, verified correct patient position, special equipment/implants available, medications/allergies/relevant history reviewed, required imaging and test results available.  Sterile Technique Maximal sterile technique including full sterile barrier drape, hand hygiene, sterile gown, sterile gloves, mask, hair covering, sterile ultrasound probe cover (if used).  Procedure Description Area of catheter insertion was cleaned with chlorhexidine and draped in sterile fashion.  With real-time ultrasound guidance a central venous catheter was placed into the left internal jugular vein. Nonpulsatile blood flow and easy flushing noted in all ports.  The catheter was sutured in place and sterile dressing applied.  Complications/Tolerance None; patient tolerated the procedure well. Chest X-ray is ordered to verify placement for internal jugular or subclavian cannulation.   Chest x-ray is not ordered for femoral cannulation.  EBL Minimal  Specimen(s) None

## 2019-11-08 NOTE — Progress Notes (Addendum)
Pharmacy Antibiotic Note  Chase [name] is a 54 y.o. male admitted on 11/07/2019 with sepsis.  Patient has received Vancomycin 1250 mg IV x 1 in the ED.  Pharmacy has been consulted for Vancomycin dosing.  Plan: Vancomycin 500mg  IV q24h Monitor vancomycin levels as appropriate Follow renal function and culture results/sensitivities  Height: 5\' 8"  (172.7 cm) Weight: 55 kg (121 lb 4.1 oz) IBW/kg (Calculated) : 68.4  Temp (24hrs), Avg:99.5 F (37.5 C), Min:97.8 F (36.6 C), Max:101.2 F (38.4 C)  Recent Labs  Lab 11/07/19 1836 11/07/19 2036  WBC 3.3*  --   CREATININE 2.92*  --   LATICACIDVEN 4.0* 4.1*    Estimated Creatinine Clearance: 22.2 mL/min (A) (by C-G formula based on SCr of 2.92 mg/dL (H)).    Allergies  Allergen Reactions  . Nsaids Other (See Comments)    Bleeding GI Ulceration    Antimicrobials this admission: 8/8 Vanc >>   8/8 Cefepime >>    Dose adjustments this admission:    Microbiology results: 8/8 BCx:   8/8 UCx:   8/8 Covid: negative    Thank you for allowing pharmacy to be a part of this patient's care.  Everette Rank, PharmD 11/08/2019 12:28 AM

## 2019-11-08 NOTE — Progress Notes (Signed)
PHARMACY - PHYSICIAN COMMUNICATION CRITICAL VALUE ALERT - BLOOD CULTURE IDENTIFICATION (BCID)  Chase [name] is an 54 y.o. male who presented to [facility] on 11/07/2019 with a chief complaint of fever and hypotension.  Assessment:  2 of 4 bottles growing E.coli and Proteus, patient has chronic indwelling foley due to BPH and large bladder diverticulum.  Name of physician (or Provider) Contacted: Marni Griffon, NP  Current antibiotics: vancomycin and cefepime  Changes to prescribed antibiotics recommended:  Recommendations accepted by provider - narrow to ceftriaxone 2g q24h  Results for orders placed or performed during the hospital encounter of 11/07/19  Blood Culture ID Panel (Reflexed) (Collected: 11/07/2019  6:36 PM)  Result Value Ref Range   Enterococcus faecalis NOT DETECTED NOT DETECTED   Enterococcus Faecium NOT DETECTED NOT DETECTED   Listeria monocytogenes NOT DETECTED NOT DETECTED   Staphylococcus species NOT DETECTED NOT DETECTED   Staphylococcus aureus (BCID) NOT DETECTED NOT DETECTED   Staphylococcus epidermidis NOT DETECTED NOT DETECTED   Staphylococcus lugdunensis NOT DETECTED NOT DETECTED   Streptococcus species NOT DETECTED NOT DETECTED   Streptococcus agalactiae NOT DETECTED NOT DETECTED   Streptococcus pneumoniae NOT DETECTED NOT DETECTED   Streptococcus pyogenes NOT DETECTED NOT DETECTED   A.calcoaceticus-baumannii NOT DETECTED NOT DETECTED   Bacteroides fragilis NOT DETECTED NOT DETECTED   Enterobacterales DETECTED (A) NOT DETECTED   Enterobacter cloacae complex NOT DETECTED NOT DETECTED   Escherichia coli DETECTED (A) NOT DETECTED   Klebsiella aerogenes NOT DETECTED NOT DETECTED   Klebsiella oxytoca NOT DETECTED NOT DETECTED   Klebsiella pneumoniae NOT DETECTED NOT DETECTED   Proteus species DETECTED (A) NOT DETECTED   Salmonella species NOT DETECTED NOT DETECTED   Serratia marcescens NOT DETECTED NOT DETECTED   Haemophilus influenzae NOT DETECTED NOT  DETECTED   Neisseria meningitidis NOT DETECTED NOT DETECTED   Pseudomonas aeruginosa NOT DETECTED NOT DETECTED   Stenotrophomonas maltophilia NOT DETECTED NOT DETECTED   Candida albicans NOT DETECTED NOT DETECTED   Candida auris NOT DETECTED NOT DETECTED   Candida glabrata NOT DETECTED NOT DETECTED   Candida krusei NOT DETECTED NOT DETECTED   Candida parapsilosis NOT DETECTED NOT DETECTED   Candida tropicalis NOT DETECTED NOT DETECTED   Cryptococcus neoformans/gattii NOT DETECTED NOT DETECTED   CTX-M ESBL NOT DETECTED NOT DETECTED   Carbapenem resistance IMP NOT DETECTED NOT DETECTED   Carbapenem resistance KPC NOT DETECTED NOT DETECTED   Carbapenem resistance NDM NOT DETECTED NOT DETECTED   Carbapenem resist OXA 48 LIKE NOT DETECTED NOT DETECTED   Carbapenem resistance VIM NOT DETECTED NOT DETECTED    Peggyann Juba, PharmD, BCPS Pharmacy: [phone] 11/08/2019  10:37 AM

## 2019-11-08 NOTE — TOC Initial Note (Signed)
Transition of Care [facility]) - Initial/Assessment Note    Patient Details  Name: Chase [name] MRN: [id number] Date of Birth: 06-Jun-1965  [facility] CM/SW Contact:    Leeroy Cha, RN Phone Number: 11/08/2019, 10:40 AM  Clinical Narrative:                 From snf-[place]/septic shock-bld cultures + GNR, E.Coli and proteus.  Iv solu cortef, maxipime,levophed,vancoready.  [place] at 3l/min.   Will follow for progression and to return to snf.  Expected Discharge Plan: [place] Barriers to Discharge: Continued Medical Work up   Patient Goals and CMS Choice Patient states their goals for this hospitalization and ongoing recovery are:: to get better and go back to [employer].gov Compare Post Acute Care list provided to:: Patient Choice offered to / list presented to : Patient  Expected Discharge Plan and Services Expected Discharge Plan: [place]   Discharge Planning Services: CM Consult Post Acute Care Choice: [place] Living arrangements for the past 2 months: [place]                                      Prior Living Arrangements/Services Living arrangements for the past 2 months: [place] Lives with:: Facility Resident Patient language and need for interpreter reviewed:: Yes Do you feel safe going back to the place where you live?: Yes      Need for Family Participation in Patient Care: Yes (Comment) Care giver support system in place?: Yes (comment)   Criminal Activity/Legal Involvement Pertinent to Current Situation/Hospitalization: No - Comment as needed  Activities of Daily Living      Permission Sought/Granted                  Emotional Assessment Appearance:: Appears stated age Attitude/Demeanor/Rapport: Engaged Affect (typically observed): Calm Orientation: : Oriented to Place, Oriented to Self, Oriented to  Time, Oriented to  Situation Alcohol / Substance Use: Not Applicable Psych Involvement: No (comment)  Admission diagnosis:  Lower urinary tract infectious disease [N39.0] Septic shock ([place]) [A41.9, R65.21] Patient Active Problem List   Diagnosis Date Noted  . Septic shock ([place]) 11/08/2019  . Protein-calorie malnutrition, severe 10/23/2019  . Cerebral embolism with cerebral infarction 10/18/2019  . Acute left-sided weakness 10/17/2019  . CKD (chronic kidney disease), stage IV ([place]) 10/17/2019  . Chronic pain 10/17/2019  . Fall 10/17/2019  . Acute lower UTI 10/17/2019   PCP:  Clinic, [place]:  No Pharmacies Listed    Social Determinants of Health (SDOH) Interventions    Readmission Risk Interventions No flowsheet data found.

## 2019-11-08 NOTE — Progress Notes (Signed)
PHARMACY NOTE:  ANTIMICROBIAL RENAL DOSAGE ADJUSTMENT  Current antimicrobial regimen includes a mismatch between antimicrobial dosage and estimated renal function.  As per policy approved by the Pharmacy & Therapeutics and Medical Executive Committees, the antimicrobial dosage will be adjusted accordingly.  Current antimicrobial dosage:  Cefepime 1gm IV q12h  Indication: Sepsis  Renal Function:  Estimated Creatinine Clearance: 22.2 mL/min (A) (by C-G formula based on SCr of 2.92 mg/dL (H)).     Antimicrobial dosage has been changed to:  Cefepime 2gm IV q24h   Thank you for allowing pharmacy to be a part of this patient's care.  Everette Rank, [facility] 11/08/2019 12:25 AM

## 2019-11-08 NOTE — Progress Notes (Signed)
NAME:  Chase [name], MRN:  [id number], DOB:  08-14-1965, LOS: 0 ADMISSION DATE:  11/07/2019, CONSULTATION DATE:  11/07/2019 REFERRING MD:  Dirk Dress ED, CHIEF COMPLAINT:  Septic shock   Brief History   54 yo M with a history of BPH with chronic indwelling foley catheter, recent CVA, CKD, chronic pain, history of Hodgkin's lymphoma (treated with ABVD and radiation), who presented from his facility for low blood pressure and fever.   History of present illness   Patient was recently admitted (7/18-7/27) for acute CVA and discharged to SNF.  Patient has a chronic indwelling foley due to BPH and large bladder diverticulum, and also admits to having several UTIs in the past. On interview, patient is alert and oriented x4 and does not feel like his blood pressure is low, but was told to go to the ED from the staff at his facility.  Has some pain on palpation of lower abdomen but no flank tenderness.  Also had pain on his sacrum, which has been there for weeks.    Past Medical History  BPH and bladder diverticulum CVA CKD Chronic pain on opiates (norco) History of Hodgkin's lymphoma, treated in 2010 PTSD Anemia History of gastric bypass COVID-19 illness.  Managed as outpatient Sept 2020.   Significant Hospital Events     Consults:  none  Procedures:  L IJ CVC 8/9  Significant Diagnostic Tests:  U/A: suggestive of infection  Micro Data:  Blood cultures  Urine cultures  Antimicrobials:  Vanc/cefepime/flagyl in the ED 8/8.   Vanc/cefepime 8/9>>  Interim history/subjective:    Objective   Blood pressure 111/77, pulse 79, temperature 97.7 F (36.5 C), temperature source Oral, resp. rate 15, height 5\' 8"  (1.727 m), weight 55 kg, SpO2 100 %.        Intake/Output Summary (Last 24 hours) at 11/08/2019 1106 Last data filed at 11/08/2019 1015 Gross per 24 hour  Intake 5179.27 ml  Output no documentation  Net 5179.27 ml   Filed Weights   11/07/19 1832  Weight: 55 kg     Examination: General 54 year old white male resting in bed no distress HENT NCAT no JVD MMM pulm clear no accessory use  Card RRR abd not tender  EXT warm dry no edema Neuro intact. Left side weaker but this is chronic and has been improving GU bloody urine   Resolved Hospital Problem list     Assessment & Plan:  Septic shock w/ GNR bacteremia :  Likely urinary source.  Had indweling foley due to urinary retention and bladder diverticulum.  CT A/P without abscess and without signs of pyelonephritis.   -pressor requirements down Plan Cont IVFs Narrow to ceftriaxone Change foley Titrate NE for MAP > 65  #AKI on CKD: prerenal likely from shock.  Though bilateral hydronephrosis suggests ? Obstruction which may chronic due to BPH and diverticulum.  I Plan Cont IVFs Keep foley  Lactic acidosis 2/2 sepsis -->slowly improving Plan Trend lactate Treat shock  Fluid and electrolyte imbalance: hypomagnesemia, hyponatremia   Plan Replace Mg IVF resuscitation Repeat chemistry   Anemia w/out evidence acute bleeding although does have hematuria from foley trauma Plan Monitor   Recent CVA: Plan Cont asa PT when stabilizes   Chronic pain: norco prn (home dose), gabapentin and duloxetine Plan Cont these meds  Failure to thrive: with history of gastric bypass surgery. Pressure ulcer: stage 2, not infected Plan RD and wound consulted.      Best practice:  Diet: regular  soft diet Pain/Anxiety/Delirium protocol (if indicated): n/a VAP protocol (if indicated): n/a DVT prophylaxis: SQH GI prophylaxis: ppi BID (home) Glucose control: SSI Mobility: PT when able Code Status: Full Family Communication: none Disposition: ICU  Labs   CBC: Recent Labs  Lab 11/07/19 1836 11/08/19 0329  WBC 3.3* 27.6*  NEUTROABS 3.1 25.9*  HGB 9.8* 7.6*  HCT 33.1* 25.0*  MCV 98.5 96.9  PLT 178 093    Basic Metabolic Panel: Recent Labs  Lab 11/07/19 1836 11/08/19 0329  NA  137 128*  K 4.1 3.8  CL 105 101  CO2 19* 13*  GLUCOSE 93 149*  BUN 41* 45*  CREATININE 2.92* 3.06*  CALCIUM 8.1* 7.0*  MG  --  1.3*  PHOS  --  4.4   GFR: Estimated Creatinine Clearance: 21.2 mL/min (A) (by C-G formula based on SCr of 3.06 mg/dL (H)). Recent Labs  Lab 11/07/19 1836 11/07/19 2036 11/08/19 0329 11/08/19 0724  WBC 3.3*  --  27.6*  --   LATICACIDVEN 4.0* 4.1* 4.9* 4.4*    Liver Function Tests: Recent Labs  Lab 11/07/19 1836  AST 36  ALT 53*  ALKPHOS 180*  BILITOT 0.6  PROT 7.5  ALBUMIN 2.7*   No results for input(s): LIPASE, AMYLASE in the last 168 hours. No results for input(s): AMMONIA in the last 168 hours.  ABG    Component Value Date/Time   HCO3 18.1 (L) 11/07/2019 1849   TCO2 15 (L) 10/17/2019 1241   ACIDBASEDEF 8.2 (H) 11/07/2019 1849   O2SAT 18.7 11/07/2019 1849     Coagulation Profile: Recent Labs  Lab 11/07/19 1836  INR 1.2    Cardiac Enzymes: No results for input(s): CKTOTAL, CKMB, CKMBINDEX, TROPONINI in the last 168 hours.  HbA1C: Hgb A1c MFr Bld  Date/Time Value Ref Range Status  10/18/2019 12:13 AM 5.4 4.8 - 5.6 % Final    Comment:    (NOTE) Pre diabetes:          5.7%-6.4%  Diabetes:              >6.4%  Glycemic control for   <7.0% adults with diabetes     CBG: Recent Labs  Lab 11/08/19 0819  GLUCAP 164*      Critical care time: 32

## 2019-11-08 NOTE — Consult Note (Signed)
WOC Nurse Consult Note: Reason for Consult: pressure injury Patient self reports he is from a SNF; has not been ambulatory in some months.  He also self reports he has had a pilonidal cyst in the is area removed about a year ago and after that he also got covid and lost a lot of weight.   Wound type: Stage 1 Pressure injury; however history of full thickness wound from cyst removal as well.  Pressure Injury POA: Yes Measurement:4cm x 2.5cmx 0cm  Wound bed: red with some non blanchable tissue Drainage (amount, consistency, odor) stool on dressing, not able to assess drainage  Periwound: intact   Dressing procedure/placement/frequency: Continue silicone foam dressings, change every 3 days and PRN soilage. On LALM while in the ICU, if moves to the floor I have requested air mattress to be ordered.    Re consult if needed, will not follow at this time. Thanks  Chase [name] [employer], RN,CWOCN, CNS, [place] [phone])

## 2019-11-08 NOTE — Progress Notes (Signed)
[facility] Progress Note Patient Name: Skylor Schnapp DOB: 1965/10/28 MRN: [id number]   Date of Service  11/08/2019  HPI/Events of Note  Asking for foley from NH, chronic cath. Difficult to insert also.  eICU Interventions  Ordered Urology consult and foley     Intervention Category Minor Interventions: Other:  Elmer Sow 11/08/2019, 7:01 AM

## 2019-11-09 DIAGNOSIS — R6521 Severe sepsis with septic shock: Secondary | ICD-10-CM | POA: Diagnosis not present

## 2019-11-09 DIAGNOSIS — N390 Urinary tract infection, site not specified: Secondary | ICD-10-CM | POA: Diagnosis not present

## 2019-11-09 DIAGNOSIS — A419 Sepsis, unspecified organism: Secondary | ICD-10-CM | POA: Diagnosis not present

## 2019-11-09 LAB — BASIC METABOLIC PANEL
Anion gap: 9 (ref 5–15)
BUN: 57 mg/dL — ABNORMAL HIGH (ref 6–20)
CO2: 18 mmol/L — ABNORMAL LOW (ref 22–32)
Calcium: 7.6 mg/dL — ABNORMAL LOW (ref 8.9–10.3)
Chloride: 112 mmol/L — ABNORMAL HIGH (ref 98–111)
Creatinine, Ser: 3.08 mg/dL — ABNORMAL HIGH (ref 0.61–1.24)
GFR calc Af Amer: 25 mL/min — ABNORMAL LOW (ref >60)
GFR calc non Af Amer: 22 mL/min — ABNORMAL LOW (ref >60)
Glucose, Bld: 192 mg/dL — ABNORMAL HIGH (ref 70–99)
Potassium: 3.6 mmol/L (ref 3.5–5.1)
Sodium: 139 mmol/L (ref 135–145)

## 2019-11-09 LAB — GLUCOSE, CAPILLARY
Glucose-Capillary: 118 mg/dL — ABNORMAL HIGH (ref 70–99)
Glucose-Capillary: 112 mg/dL — ABNORMAL HIGH (ref 70–99)
Glucose-Capillary: 135 mg/dL — ABNORMAL HIGH (ref 70–99)
Glucose-Capillary: 178 mg/dL — ABNORMAL HIGH (ref 70–99)

## 2019-11-09 MED ORDER — LACTATED RINGERS IV SOLN: INTRAVENOUS | Status: DC

## 2019-11-09 MED ORDER — SODIUM BICARBONATE 650 MG PO TABS
1950.0000 mg | ORAL_TABLET | Freq: Three times a day (TID) | ORAL | Status: DC
  Administered 2019-11-09 – 2019-11-11 (×6): 1950 mg via ORAL
  Filled 2019-11-09 (×8): qty 3

## 2019-11-09 MED ORDER — HEPARIN SODIUM (PORCINE) 5000 UNIT/ML IJ SOLN
5000.0000 [IU] | Freq: Three times a day (TID) | INTRAMUSCULAR | Status: DC
  Administered 2019-11-10: 5000 [IU] via SUBCUTANEOUS
  Filled 2019-11-09: qty 1

## 2019-11-09 MED ORDER — HYDROXYZINE HCL 10 MG PO TABS
10.0000 mg | ORAL_TABLET | Freq: Three times a day (TID) | ORAL | Status: DC | PRN
  Administered 2019-11-09 – 2019-11-18 (×14): 10 mg via ORAL
  Filled 2019-11-09 (×15): qty 1

## 2019-11-09 MED ORDER — MIDODRINE HCL 5 MG PO TABS
5.0000 mg | ORAL_TABLET | Freq: Three times a day (TID) | ORAL | Status: DC
  Administered 2019-11-09 – 2019-11-19 (×29): 5 mg via ORAL
  Filled 2019-11-09 (×31): qty 1

## 2019-11-09 MED ORDER — TAMSULOSIN HCL 0.4 MG PO CAPS
0.4000 mg | ORAL_CAPSULE | Freq: Every day | ORAL | Status: DC
  Administered 2019-11-09 – 2019-11-19 (×11): 0.4 mg via ORAL
  Filled 2019-11-09 (×11): qty 1

## 2019-11-09 MED ORDER — MIDODRINE HCL 5 MG PO TABS
2.5000 mg | ORAL_TABLET | Freq: Three times a day (TID) | ORAL | Status: DC
  Administered 2019-11-09: 2.5 mg via ORAL
  Filled 2019-11-09: qty 1

## 2019-11-09 NOTE — Progress Notes (Addendum)
NAME:  Chase [name], MRN:  [id number], DOB:  10-31-1965, LOS: 1 ADMISSION DATE:  11/07/2019, CONSULTATION DATE:  11/07/2019 REFERRING MD:  [facility], CHIEF COMPLAINT:  Septic shock   Brief History   54 yo M with a history of BPH with chronic indwelling foley catheter, CVA (7/18-7/27 admit), CKD, chronic pain, history of Hodgkin's lymphoma (treated with ABVD and radiation), who admitted 8/8 from SNF with reports of low blood pressure and fever.    Past Medical History  BPH and bladder diverticulum CVA CKD Chronic pain on opiates (norco) History of Hodgkin's lymphoma, treated in 2010 PTSD Anemia History of gastric bypass COVID-19 illness.  Managed as outpatient Sept 2020.   Significant Hospital Events   8/08 Admit from SNF  Consults:     Procedures:  L IJ CVC 8/9 >>   Significant Diagnostic Tests:  U/A 8/8 >> suggestive of infection  Micro Data:  COVID 8/8 >> negative  BCx2 8/8 >> E-Coli >>  UC 8/8 >> 70k E-Coli >>  Antimicrobials:  Vanc/cefepime/flagyl in the ED 8/8.   Vanc 8/8 >> 8/9  Cefepime 8/9 >> 8/9 Ceftriaxone 8/9 >>   Interim history/subjective:  On RA  Glucose range 93-192 Afebrile  I/O 3.1L UOP, -1.2L in last 24 hours  Levophed down to 24mcg   Objective   Blood pressure 99/61, pulse 82, temperature (!) 96.6 F (35.9 C), temperature source Axillary, resp. rate 13, height 5\' 8"  (1.727 m), weight 55 kg, SpO2 98 %.        Intake/Output Summary (Last 24 hours) at 11/09/2019 1037 Last data filed at 11/09/2019 1022 Gross per 24 hour  Intake 1331.77 ml  Output 3100 ml  Net -1768.23 ml   Filed Weights   11/07/19 1832  Weight: 55 kg    Examination: General: adult male lying in bed in NAD HEENT: MM pink/moist, wearing glasses, keeps eyes closed during communication Neuro: AAOx4, speech clear, MAE, chronic / residual left sided weakness but improved overall CV: s1s2 RRR, no m/r/g PULM: non-labored, lungs bilaterally clear  GI / GU: soft, bsx4 active,  bloody urine in foley tubing   Extremities: warm/dry, no edema  Skin: no rashes or lesions  Resolved Hospital Problem list     Assessment & Plan:   Septic shock in setting of E-Coli UTI with associated Bacteremia  Had indweling foley due to urinary retention and bladder diverticulum.  CT A/P without abscess and without signs of pyelonephritis.   -wean levophed to off for MAP >65 -continue stress dose steroids -continue ceftriaxone, D3/x abx -will need outpatient follow up for indwelling foley, gets care through the [place] -resume home midodrine -resume flomax  AKI on CKD Baseline sr cr ~ 2.3-2.5.  Prerenal likely from shock.  Though bilateral hydronephrosis suggests ? obstruction which may chronic due to BPH and diverticulum.   -Trend BMP / urinary output -Avoid nephrotoxic agents, ensure adequate renal perfusion -resume home bicarb  Hematuria  Urinary Retention  -continue foley  Lactic Acidosis in setting of Sepsis  -clearing, continue IVF's   Fluid and electrolyte imbalance: hypomagnesemia, hyponatremia   -monitor electrolytes, replace as indicated   Hyperglycemia  -SSI, TID with sensitive scale    Anemia  No overt evidence of acute bleeding although does have hematuria from foley trauma -trend CBC  -transfuse for Hgb <7% or active bleeding  -reschedule heparin for am given hematuria post foley trauma   Recent CVA (July 2021) -continue ASA -PT efforts  -pt anxious to get back  to [facility] for rehab as he gets a limited number of rehab days   Chronic Pain Norco prn (home dose), gabapentin and duloxetine -continue home meds  Failure to Thrive History of gastric bypass surgery.  Pressure ulcer: stage 2, not infected -continue nutritional support  -MVI -wound care per [place]    Best practice:  Diet: regular soft diet Pain/Anxiety/Delirium protocol (if indicated): n/a VAP protocol (if indicated): n/a DVT prophylaxis: SQH GI prophylaxis: ppi BID (home) Glucose  control: SSI Mobility: PT / OT  Code Status: Full Family Communication: Patient updated 8/10 on plan of care Disposition: ICU  Labs   CBC: Recent Labs  Lab 11/07/19 1836 11/08/19 0329  WBC 3.3* 27.6*  NEUTROABS 3.1 25.9*  HGB 9.8* 7.6*  HCT 33.1* 25.0*  MCV 98.5 96.9  PLT 178 130    Basic Metabolic Panel: Recent Labs  Lab 11/07/19 1836 11/08/19 0329 11/08/19 1136 11/09/19 0556  NA 137 128* 134* 139  K 4.1 3.8 4.3 3.6  CL 105 101 109 112*  CO2 19* 13* 13* 18*  GLUCOSE 93 149* 187* 192*  BUN 41* 45* 53* 57*  CREATININE 2.92* 3.06* 3.09* 3.08*  CALCIUM 8.1* 7.0* 6.9* 7.6*  MG  --  1.3*  --   --   PHOS  --  4.4  --   --    GFR: Estimated Creatinine Clearance: 21.1 mL/min (A) (by C-G formula based on SCr of 3.08 mg/dL (H)). Recent Labs  Lab 11/07/19 1836 11/07/19 2036 11/08/19 0329 11/08/19 0724  WBC 3.3*  --  27.6*  --   LATICACIDVEN 4.0* 4.1* 4.9* 4.4*    Liver Function Tests: Recent Labs  Lab 11/07/19 1836  AST 36  ALT 53*  ALKPHOS 180*  BILITOT 0.6  PROT 7.5  ALBUMIN 2.7*   No results for input(s): LIPASE, AMYLASE in the last 168 hours. No results for input(s): AMMONIA in the last 168 hours.  ABG    Component Value Date/Time   HCO3 18.1 (L) 11/07/2019 1849   TCO2 15 (L) 10/17/2019 1241   ACIDBASEDEF 8.2 (H) 11/07/2019 1849   O2SAT 18.7 11/07/2019 1849     Coagulation Profile: Recent Labs  Lab 11/07/19 1836  INR 1.2    Cardiac Enzymes: No results for input(s): CKTOTAL, CKMB, CKMBINDEX, TROPONINI in the last 168 hours.  HbA1C: Hgb A1c MFr Bld  Date/Time Value Ref Range Status  10/18/2019 12:13 AM 5.4 4.8 - 5.6 % Final    Comment:    (NOTE) Pre diabetes:          5.7%-6.4%  Diabetes:              >6.4%  Glycemic control for   <7.0% adults with diabetes     CBG: Recent Labs  Lab 11/08/19 1227 11/08/19 1548 11/08/19 2130 11/08/19 2304 11/09/19 0904  GLUCAP 143* 120* 142* 128* 118*      Critical care time: 58  minutes     Noe Gens, MSN, NP-C [facility] 11/09/2019, 10:37 AM   Please see [email or web address] for pager details.

## 2019-11-09 NOTE — Plan of Care (Signed)

## 2019-11-10 DIAGNOSIS — R6521 Severe sepsis with septic shock: Secondary | ICD-10-CM | POA: Diagnosis not present

## 2019-11-10 DIAGNOSIS — A419 Sepsis, unspecified organism: Secondary | ICD-10-CM | POA: Diagnosis not present

## 2019-11-10 DIAGNOSIS — N390 Urinary tract infection, site not specified: Secondary | ICD-10-CM | POA: Diagnosis not present

## 2019-11-10 LAB — CBC
HCT: 20.5 % — ABNORMAL LOW (ref 39.0–52.0)
Hemoglobin: 6.2 g/dL — CL (ref 13.0–17.0)
MCH: 28.7 pg (ref 26.0–34.0)
MCHC: 30.2 g/dL (ref 30.0–36.0)
MCV: 94.9 fL (ref 80.0–100.0)
Platelets: 113 10*3/uL — ABNORMAL LOW (ref 150–400)
RBC: 2.16 MIL/uL — ABNORMAL LOW (ref 4.22–5.81)
RDW: 20.8 % — ABNORMAL HIGH (ref 11.5–15.5)
WBC: 9.9 10*3/uL (ref 4.0–10.5)
nRBC: 0 % (ref 0.0–0.2)

## 2019-11-10 LAB — COMPREHENSIVE METABOLIC PANEL
ALT: 41 U/L (ref 0–44)
AST: 17 U/L (ref 15–41)
Albumin: 1.8 g/dL — ABNORMAL LOW (ref 3.5–5.0)
Alkaline Phosphatase: 111 U/L (ref 38–126)
Anion gap: 9 (ref 5–15)
BUN: 62 mg/dL — ABNORMAL HIGH (ref 6–20)
CO2: 19 mmol/L — ABNORMAL LOW (ref 22–32)
Calcium: 7.5 mg/dL — ABNORMAL LOW (ref 8.9–10.3)
Chloride: 109 mmol/L (ref 98–111)
Creatinine, Ser: 3.05 mg/dL — ABNORMAL HIGH (ref 0.61–1.24)
GFR calc Af Amer: 25 mL/min — ABNORMAL LOW (ref >60)
GFR calc non Af Amer: 22 mL/min — ABNORMAL LOW (ref >60)
Glucose, Bld: 127 mg/dL — ABNORMAL HIGH (ref 70–99)
Potassium: 4 mmol/L (ref 3.5–5.1)
Sodium: 137 mmol/L (ref 135–145)
Total Bilirubin: 0.4 mg/dL (ref 0.3–1.2)
Total Protein: 5.6 g/dL — ABNORMAL LOW (ref 6.5–8.1)

## 2019-11-10 LAB — GLUCOSE, CAPILLARY
Glucose-Capillary: 126 mg/dL — ABNORMAL HIGH (ref 70–99)
Glucose-Capillary: 237 mg/dL — ABNORMAL HIGH (ref 70–99)
Glucose-Capillary: 97 mg/dL (ref 70–99)
Glucose-Capillary: 146 mg/dL — ABNORMAL HIGH (ref 70–99)

## 2019-11-10 LAB — URINE CULTURE: Culture: 70000 — AB

## 2019-11-10 LAB — PROTIME-INR
INR: 1.3 — ABNORMAL HIGH (ref 0.8–1.2)
Prothrombin Time: 15.4 seconds — ABNORMAL HIGH (ref 11.4–15.2)

## 2019-11-10 LAB — CULTURE, BLOOD (ROUTINE X 2)
Special Requests: ADEQUATE
Special Requests: ADEQUATE

## 2019-11-10 LAB — PREPARE RBC (CROSSMATCH)

## 2019-11-10 MED ORDER — CHLORHEXIDINE GLUCONATE CLOTH 2 % EX PADS
6.0000 | MEDICATED_PAD | Freq: Every day | CUTANEOUS | Status: DC
  Administered 2019-11-11 – 2019-11-19 (×8): 6 via TOPICAL

## 2019-11-10 MED ORDER — SODIUM CHLORIDE 0.9% IV SOLUTION: Freq: Once | INTRAVENOUS | Status: AC

## 2019-11-10 MED ORDER — SODIUM CHLORIDE 0.9 % IV SOLN
2.0000 g | INTRAVENOUS | Status: DC
  Administered 2019-11-10 – 2019-11-19 (×10): 2 g via INTRAVENOUS
  Filled 2019-11-10 (×11): qty 2

## 2019-11-10 NOTE — Progress Notes (Signed)
PT Cancellation Note  Patient Details Name: Chase [name] MRN: [id number] DOB: May 06, 1965   Cancelled Treatment:    Reason Eval/Treat Not Completed: Medical issues which prohibited therapy, Low Hgb, to get blood. Will check back another time.   [name] 11/10/2019, 10:53 AM  Post Pager [phone] Office [phone]

## 2019-11-10 NOTE — Progress Notes (Signed)
OT Cancellation Note  Patient Details Name: Chase [name] MRN: [id number] DOB: 03-Jul-1965   Cancelled Treatment:    Reason Eval/Treat Not Completed: Patient not medically ready Patient HGB 6.2 and PRBC ordered. Will hold patient for now and f/u as able when patient hemodynamically stable.  Jeremiah Tarpley L Almer [place] 11/10/2019, 10:58 AM

## 2019-11-10 NOTE — Progress Notes (Signed)
CRITICAL VALUE ALERT  Critical Value:  Hgb 6.2  Date & Time Notied:  11/10/19 1026  Provider Notified: Marni Griffon  Orders Received/Actions taken: Transfuse PRBC

## 2019-11-10 NOTE — Progress Notes (Addendum)
NAME:  Chase [name], MRN:  [id number], DOB:  12-Sep-1965, LOS: 2 ADMISSION DATE:  11/07/2019, CONSULTATION DATE:  11/07/2019 REFERRING MD:  [facility], CHIEF COMPLAINT:  Septic shock   Brief History   54 yo M with a history of BPH with chronic indwelling foley catheter, CVA (7/18-7/27 admit), CKD, chronic pain, history of Hodgkin's lymphoma (treated with ABVD and radiation), who admitted 8/8 from SNF with reports of low blood pressure and fever.    Past Medical History  BPH and bladder diverticulum CVA CKD Chronic pain on opiates (norco) History of Hodgkin's lymphoma, treated in 2010 PTSD Anemia History of gastric bypass COVID-19 illness.  Managed as outpatient Sept 2020.   Significant Hospital Events   8/08 Admit from SNF 8/9: Continued on pressors  8/10: On midodrine started.   8/11: Pseudomonas growing in urine cefepime off pressors getting blood. Stopping heparin. Hgb 6.2 w/ on-going hematuria  Consults:     Procedures:  L IJ CVC 8/9 >> 8/10  Significant Diagnostic Tests:  U/A 8/8 >> suggestive of infection  Micro Data:  COVID 8/8 >> negative  BCx2 8/8 >> E-Coli and protease UC 8/8 >> 70k E-Coli and 100 K Pseudomonas  Antimicrobials:  Vanc/cefepime/flagyl in the ED 8/8.   Vanc 8/8 >> 8/9  Cefepime 8/9 >> 8/9 Ceftriaxone 8/9 >> 8/11 Cefepime 8/11>>  Interim history/subjective:  Feels better  Objective   Blood pressure (Abnormal) 100/56, pulse 61, temperature 97.8 F (36.6 C), temperature source Oral, resp. rate 12, height 5\' 8"  (1.727 m), weight 55 kg, SpO2 99 %.        Intake/Output Summary (Last 24 hours) at 11/10/2019 0956 Last data filed at 11/10/2019 3557 Gross per 24 hour  Intake 1488.39 ml  Output 2350 ml  Net -861.61 ml   Filed Weights   11/07/19 1832  Weight: 55 kg    Examination: General 54 year old white male lying in bed no acute distress HEENT normocephalic atraumatic some temporal wasting otherwise mucous membranes moist Pulmonary  clear to auscultation no accessory use currently room air Abdomen soft nontender GU still w/ cheer wine colored UOP Neuro awake oriented some left-sided weakness Phonation no edema  Resolved Hospital Problem list   Septic shock  Chronic Urinary retention  Lactic acidosis  Assessment & Plan:   Sepsis Urinary tract source. e-coli and Proteus bacteremia AND Pseudomonas and e-coli UTI Had indweling foley due to urinary retention and bladder diverticulum.  CT A/P without abscess and without signs of pyelonephritis.   -foley changed 8/9 Plan Changed to cefepime today; will go home to SNF on cipro w/ plan to complete 7 total days of abx starting today  Needs out-pt follow up for his indwelling catheter. [facility] stress dose steroids Cont home midodrine Transfer to medsurg    AKI on CKD w/ NAG metabolic acidosis  Baseline sr cr ~ 2.3-2.5.  Prerenal likely from shock.  Though bilateral hydronephrosis suggests ? obstruction which may chronic due to BPH and diverticulum.  ->peaked scr 3.08 Plan Repeating chem this am  Cont home bicarb  Urinary Retention  Plan  Cont his foley cath   Intermittent Fluid and electrolyte imbalance Plan Chemistry being repeated  Will replace as indicated   Hyperglycemia  Plan ssi   Anemia w/ on-going hematuria No overt evidence of acute bleeding although does have hematuria from foley trauma Plan Transfuse Stop Heparin  Recent CVA (July 2021) Plan Cont PT/OT Cont asa  Can prob go back to  rehab in am 8/12 as long as renal fxn trending correct direction     Chronic Pain Norco prn (home dose), gabapentin and duloxetine Plan Cont home meds   Failure to Thrive History of gastric bypass surgery.  Pressure ulcer: stage 2, not infected Plan Cont PT/OT/nutritional support   Best practice:  Diet: regular soft diet Pain/Anxiety/Delirium protocol (if indicated): n/a VAP protocol (if indicated): n/a DVT prophylaxis: SQH GI  prophylaxis: ppi BID (home) Glucose control: SSI Mobility: PT / OT  Code Status: Full Family Communication: Patient updated 8/10 on plan of care Disposition: medsurg. Back to SNF when hgb stable and hematuria resolved.   Labs   CBC: Recent Labs  Lab 11/07/19 1836 11/08/19 0329  WBC 3.3* 27.6*  NEUTROABS 3.1 25.9*  HGB 9.8* 7.6*  HCT 33.1* 25.0*  MCV 98.5 96.9  PLT 178 809    Basic Metabolic Panel: Recent Labs  Lab 11/07/19 1836 11/08/19 0329 11/08/19 1136 11/09/19 0556  NA 137 128* 134* 139  K 4.1 3.8 4.3 3.6  CL 105 101 109 112*  CO2 19* 13* 13* 18*  GLUCOSE 93 149* 187* 192*  BUN 41* 45* 53* 57*  CREATININE 2.92* 3.06* 3.09* 3.08*  CALCIUM 8.1* 7.0* 6.9* 7.6*  MG  --  1.3*  --   --   PHOS  --  4.4  --   --    GFR: Estimated Creatinine Clearance: 21.1 mL/min (A) (by C-G formula based on SCr of 3.08 mg/dL (H)). Recent Labs  Lab 11/07/19 1836 11/07/19 2036 11/08/19 0329 11/08/19 0724  WBC 3.3*  --  27.6*  --   LATICACIDVEN 4.0* 4.1* 4.9* 4.4*    Liver Function Tests: Recent Labs  Lab 11/07/19 1836  AST 36  ALT 53*  ALKPHOS 180*  BILITOT 0.6  PROT 7.5  ALBUMIN 2.7*   No results for input(s): LIPASE, AMYLASE in the last 168 hours. No results for input(s): AMMONIA in the last 168 hours.  ABG    Component Value Date/Time   HCO3 18.1 (L) 11/07/2019 1849   TCO2 15 (L) 10/17/2019 1241   ACIDBASEDEF 8.2 (H) 11/07/2019 1849   O2SAT 18.7 11/07/2019 1849     Coagulation Profile: Recent Labs  Lab 11/07/19 1836  INR 1.2    Cardiac Enzymes: No results for input(s): CKTOTAL, CKMB, CKMBINDEX, TROPONINI in the last 168 hours.  HbA1C: Hgb A1c MFr Bld  Date/Time Value Ref Range Status  10/18/2019 12:13 AM 5.4 4.8 - 5.6 % Final    Comment:    (NOTE) Pre diabetes:          5.7%-6.4%  Diabetes:              >6.4%  Glycemic control for   <7.0% adults with diabetes     CBG: Recent Labs  Lab 11/09/19 0904 11/09/19 1244 11/09/19 1653  11/09/19 2125 11/10/19 0840  GLUCAP 118* 112* [place] ACNP-BC [place] Pager # [phone] OR # [phone] if no answer

## 2019-11-10 NOTE — Hospital Course (Signed)
8/9 admitted w/ working dx of septic shock/UT source. Volume resuscitated. CVL placed. Started on Norepi. Broad spec abx started. On AM rounds BC w/ GNR. Vanc stopped. Abx narrowed to ceftriaxone. Foley catheter changed. Was having hematuria prior to this from [facility] being pulled during bed transfer 8/10 still on pressors. Stress dose steroids and midodrine started. Midodrine was home med. Ecoli growing in blood and urine. Renal fxn holding ~ 3 (baseline in mid-2 range) 8/11 off pressors. Stress dose steroids stopped. Midodrine continued (home med) Still having hematuria. Heparin stopped. Hgb 6.2 so transfused. Cultures finalizing. Growing ecoli and Proteus in blood and e-coli and pseudomonas in urine. ABX changed to cefepime. Transferred to [facility]

## 2019-11-11 LAB — TYPE AND SCREEN
ABO/RH(D): O POS
Antibody Screen: NEGATIVE
Unit division: 0

## 2019-11-11 LAB — BASIC METABOLIC PANEL
Anion gap: 9 (ref 5–15)
BUN: 62 mg/dL — ABNORMAL HIGH (ref 6–20)
CO2: 21 mmol/L — ABNORMAL LOW (ref 22–32)
Calcium: 7.4 mg/dL — ABNORMAL LOW (ref 8.9–10.3)
Chloride: 109 mmol/L (ref 98–111)
Creatinine, Ser: 3.02 mg/dL — ABNORMAL HIGH (ref 0.61–1.24)
GFR calc Af Amer: 26 mL/min — ABNORMAL LOW (ref >60)
GFR calc non Af Amer: 22 mL/min — ABNORMAL LOW (ref >60)
Glucose, Bld: 110 mg/dL — ABNORMAL HIGH (ref 70–99)
Potassium: 3.6 mmol/L (ref 3.5–5.1)
Sodium: 139 mmol/L (ref 135–145)

## 2019-11-11 LAB — CBC
HCT: 23.3 % — ABNORMAL LOW (ref 39.0–52.0)
Hemoglobin: 7.3 g/dL — ABNORMAL LOW (ref 13.0–17.0)
MCH: 29.3 pg (ref 26.0–34.0)
MCHC: 31.3 g/dL (ref 30.0–36.0)
MCV: 93.6 fL (ref 80.0–100.0)
Platelets: 114 10*3/uL — ABNORMAL LOW (ref 150–400)
RBC: 2.49 MIL/uL — ABNORMAL LOW (ref 4.22–5.81)
RDW: 19.6 % — ABNORMAL HIGH (ref 11.5–15.5)
WBC: 8.7 10*3/uL (ref 4.0–10.5)
nRBC: 0 % (ref 0.0–0.2)

## 2019-11-11 LAB — BPAM RBC
Blood Product Expiration Date: 202109092359
ISSUE DATE / TIME: 202108111509
Unit Type and Rh: 5100

## 2019-11-11 LAB — GLUCOSE, CAPILLARY
Glucose-Capillary: 99 mg/dL (ref 70–99)
Glucose-Capillary: 95 mg/dL (ref 70–99)
Glucose-Capillary: 82 mg/dL (ref 70–99)
Glucose-Capillary: 90 mg/dL (ref 70–99)

## 2019-11-11 LAB — MAGNESIUM: Magnesium: 2.4 mg/dL (ref 1.7–2.4)

## 2019-11-11 MED ORDER — LACTATED RINGERS IV SOLN: INTRAVENOUS | Status: DC

## 2019-11-11 MED ORDER — SENNOSIDES-DOCUSATE SODIUM 8.6-50 MG PO TABS
2.0000 | ORAL_TABLET | Freq: Every evening | ORAL | Status: DC | PRN
  Administered 2019-11-12 – 2019-11-17 (×2): 2 via ORAL
  Filled 2019-11-11 (×2): qty 2

## 2019-11-11 MED ORDER — POLYETHYLENE GLYCOL 3350 17 G PO PACK
17.0000 g | PACK | Freq: Every day | ORAL | Status: DC | PRN
  Administered 2019-11-13 – 2019-11-17 (×3): 17 g via ORAL
  Filled 2019-11-11 (×2): qty 1

## 2019-11-11 NOTE — Progress Notes (Signed)
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  Initially found to be in septic shock from urosepsis started on pressors, urine cultures grew Pseudomonas and E. coli.  Initially on broad-spectrum antibiotics then transition to cefepime.   Assessment & Plan:   Active Problems:   Lower urinary tract infectious disease   Septic shock (HCC)  Septic shock secondary to urosepsis from E. coli and Proteus Bacteremia-E. coli and Proteus -Shock physiology has resolved, sepsis physiology is improving. -Currently on cefepime.  Plan to transition to Cipro to complete 7-day course, last day 11/16/2019 -Maintain indwelling Foley, change during this hospitalization.  Will need to follow-up with urology at [facility].  Acute kidney injury on CKD stage IIIa Chronic urinary retention secondary to BPH -Baseline creatinine 2.3, peaked at 3.08.  Foley catheter is in place -Creatinine this morning 3.02.  Bicarb levels better, will discontinue bicarb tablets for now.  Persistent hematuria Acute blood loss anemia -Suspect from traumatic Foley catheter insertion.  Subcutaneous heparin discontinued.  Continue to monitor for now. -Baseline hemoglobin 9.0, it dropped as low as 6.2 status post 1 unit PRBC transfusion.  No iron studies done prior to transfusion.  Currently on iron supplements.  Continue bowel regimen. -If no improvement over next 24-48 hours, may require urology consult and continuous bladder irrigation.  Recent CVA, July 2021 -Continue aspirin.  Will need PT/OT.  Chronic pain -On home Norco as needed.  Cymbalta and  gabapentin  Adult failure to thrive with history of gastric bypass Sacral stage II pressure ulcer, no signs of infection -Nutritional support, out of bed to chair.  GERD -PPI twice daily.   DVT prophylaxis: Place and maintain sequential compression device Start: 11/10/19 1028 Code Status: Full code Family Communication:    Status is: Inpatient  Remains inpatient appropriate because:Inpatient level of care appropriate due to severity of illness   Dispo: The patient is from: SNF              Anticipated d/c is to: SNF              Anticipated d/c date is: 1 day              Patient currently is not medically stable to d/c.  Patient is still having hematuria with blood loss anemia.  Once this stabilizes he will also get PT/OT evaluation thereafter transition to SNF.       Body mass index is 18.5 kg/m.  Pressure Injury 11/08/19 Coccyx (Active)  11/08/19 0300  Location: Coccyx  Location Orientation:   Staging:   Wound Description (Comments):   Present on Admission:      Subjective: Still having bright red blood urine in his Foley catheter.  Does not have any other complaints.  Really wishes to go to his rehab facility but I explained to him that he needs further fluids and closer monitoring her hemoglobin.  Review of Systems Otherwise negative except as per HPI, including: General: Denies fever, chills, night sweats or unintended weight loss. Resp: Denies cough, wheezing, shortness of breath. Cardiac: Denies chest pain, palpitations, orthopnea, paroxysmal nocturnal dyspnea. GI: Denies abdominal pain, nausea, vomiting, diarrhea or constipation GU: Having  hematuria MS: Denies muscle aches, joint pain or swelling Neuro: Denies headache, neurologic deficits (focal weakness, numbness, tingling), abnormal gait Psych: Denies anxiety, depression, SI/HI/AVH Skin: Denies new rashes or lesions ID: Denies sick contacts, exotic exposures, travel  Examination:  General exam:  Appears calm and comfortable  Respiratory system: Clear to auscultation. Respiratory effort normal. Cardiovascular system: S1 & S2 heard, RRR. No JVD, murmurs, rubs, gallops or clicks. No pedal edema. Gastrointestinal system: Abdomen is nondistended, soft and nontender. No organomegaly or masses felt. Normal bowel sounds heard. Central nervous system: Alert and oriented. No focal neurological deficits. Extremities: Symmetric 4 x 5 power. Skin: No rashes, lesions or ulcers Psychiatry: Judgement and insight appear normal. Mood & affect appropriate.  Stage II sacral ulcer Foley catheter in place with hematuria   Objective: Vitals:   11/11/19 0121 11/11/19 0500 11/11/19 0530 11/11/19 0600  BP: 100/61  94/65 107/72  Pulse: (!) 52  (!) 53   Resp: 18  19   Temp: 97.9 F (36.6 C)  98 F (36.7 C)   TempSrc: Oral     SpO2: 100%  99%   Weight:  55.2 kg    Height:        Intake/Output Summary (Last 24 hours) at 11/11/2019 0756 Last data filed at 11/11/2019 0500 Gross per 24 hour  Intake 1762.76 ml  Output 1850 ml  Net -87.24 ml   Filed Weights   11/07/19 1832 11/11/19 0500  Weight: 55 kg 55.2 kg     Data Reviewed:   CBC: Recent Labs  Lab 11/07/19 1836 11/08/19 0329 11/10/19 0932 11/11/19 0329  WBC 3.3* 27.6* 9.9 8.7  NEUTROABS 3.1 25.9*  --   --   HGB 9.8* 7.6* 6.2* 7.3*  HCT 33.1* 25.0* 20.5* 23.3*  MCV 98.5 96.9 94.9 93.6  PLT 178 163 113* 818*   Basic Metabolic Panel: Recent Labs  Lab 11/07/19 1836 11/08/19 0329 11/08/19 1136 11/09/19 0556 11/10/19 0932  NA 137 128* 134* 139 137  K 4.1 3.8 4.3 3.6 4.0  CL 105 101 109 112* 109  CO2 19* 13* 13* 18* 19*  GLUCOSE 93 149* 187* 192* 127*  BUN 41* 45* 53* 57* 62*  CREATININE 2.92* 3.06* 3.09* 3.08* 3.05*  CALCIUM 8.1* 7.0* 6.9* 7.6* 7.5*  MG  --  1.3*  --   --   --   PHOS  --  4.4  --   --   --    GFR: Estimated Creatinine Clearance: 21.4 mL/min (A) (by C-G formula based on SCr of 3.05 mg/dL (H)). Liver  Function Tests: Recent Labs  Lab 11/07/19 1836 11/10/19 0932  AST 36 17  ALT 53* 41  ALKPHOS 180* 111  BILITOT 0.6 0.4  PROT 7.5 5.6*  ALBUMIN 2.7* 1.8*   No results for input(s): LIPASE, AMYLASE in the last 168 hours. No results for input(s): AMMONIA in the last 168 hours. Coagulation Profile: Recent Labs  Lab 11/07/19 1836 11/10/19 1052  INR 1.2 1.3*   Cardiac Enzymes: No results for input(s): CKTOTAL, CKMB, CKMBINDEX, TROPONINI in the last 168 hours. BNP (last 3 results) No results for input(s): PROBNP in the last 8760 hours. HbA1C: No results for input(s): HGBA1C in the last 72 hours. CBG: Recent Labs  Lab 11/10/19 0840 11/10/19 1229 11/10/19 1642 11/10/19 2119 11/11/19 0738  GLUCAP 126* 237* 97 146* 99   Lipid Profile: No results for input(s): CHOL, HDL, LDLCALC, TRIG, CHOLHDL, LDLDIRECT in the last 72 hours. Thyroid Function Tests: No results  for input(s): TSH, T4TOTAL, FREET4, T3FREE, THYROIDAB in the last 72 hours. Anemia Panel: No results for input(s): VITAMINB12, FOLATE, FERRITIN, TIBC, IRON, RETICCTPCT in the last 72 hours. Sepsis Labs: Recent Labs  Lab 11/07/19 1836 11/07/19 2036 11/08/19 0329 11/08/19 0724  LATICACIDVEN 4.0* 4.1* 4.9* 4.4*    Recent Results (from the past 240 hour(s))  Culture, blood (Routine x 2)     Status: Abnormal   Collection Time: 11/07/19  6:36 PM   Specimen: BLOOD  Result Value Ref Range Status   Specimen Description   Final    BLOOD RIGHT ANTECUBITAL Performed at [facility], [place] [address] [place], [place] [address]    Special Requests   Final    BOTTLES DRAWN AEROBIC AND ANAEROBIC Blood Culture adequate volume Performed at [facility] [address], [place], [place] [address]    Culture  Setup Time   Final    GRAM NEGATIVE RODS IN BOTH AEROBIC AND ANAEROBIC BOTTLES CRITICAL RESULT CALLED TO, READ BACK BY AND VERIFIED WITH: Guadlupe Spanish PharmD 10:00 11/08/19  ([email or web address]) Performed at [facility], [address] [place], [place] [address]    Culture ESCHERICHIA COLI PROTEUS MIRABILIS  (A)  Final   Report Status 11/10/2019 FINAL  Final   Organism ID, Bacteria ESCHERICHIA COLI  Final   Organism ID, Bacteria PROTEUS MIRABILIS  Final      Susceptibility   Escherichia coli - MIC*    AMPICILLIN <=2 SENSITIVE Sensitive     CEFAZOLIN <=4 SENSITIVE Sensitive     CEFEPIME <=0.12 SENSITIVE Sensitive     CEFTAZIDIME <=1 SENSITIVE Sensitive     CEFTRIAXONE <=0.25 SENSITIVE Sensitive     CIPROFLOXACIN <=0.25 SENSITIVE Sensitive     GENTAMICIN <=1 SENSITIVE Sensitive     IMIPENEM <=0.25 SENSITIVE Sensitive     TRIMETH/SULFA <=20 SENSITIVE Sensitive     AMPICILLIN/SULBACTAM <=2 SENSITIVE Sensitive     PIP/TAZO <=4 SENSITIVE Sensitive     * ESCHERICHIA COLI   Proteus mirabilis - MIC*    AMPICILLIN <=2 SENSITIVE Sensitive     CEFAZOLIN <=4 SENSITIVE Sensitive     CEFEPIME <=0.12 SENSITIVE Sensitive     CEFTAZIDIME <=1 SENSITIVE Sensitive     CEFTRIAXONE <=0.25 SENSITIVE Sensitive     CIPROFLOXACIN <=0.25 SENSITIVE Sensitive     GENTAMICIN <=1 SENSITIVE Sensitive     IMIPENEM 2 SENSITIVE Sensitive     TRIMETH/SULFA <=20 SENSITIVE Sensitive     AMPICILLIN/SULBACTAM <=2 SENSITIVE Sensitive     PIP/TAZO <=4 SENSITIVE Sensitive     * PROTEUS MIRABILIS  Blood Culture ID Panel (Reflexed)     Status: Abnormal   Collection Time: 11/07/19  6:36 PM  Result Value Ref Range Status   Enterococcus faecalis NOT DETECTED NOT DETECTED Final   Enterococcus Faecium NOT DETECTED NOT DETECTED Final   Listeria monocytogenes NOT DETECTED NOT DETECTED Final   Staphylococcus species NOT DETECTED NOT DETECTED Final   Staphylococcus aureus (BCID) NOT DETECTED NOT DETECTED Final   Staphylococcus epidermidis NOT DETECTED NOT DETECTED Final   Staphylococcus lugdunensis NOT DETECTED NOT DETECTED Final   Streptococcus species NOT DETECTED NOT DETECTED Final    Streptococcus agalactiae NOT DETECTED NOT DETECTED Final   Streptococcus pneumoniae NOT DETECTED NOT DETECTED Final   Streptococcus pyogenes NOT DETECTED NOT DETECTED Final   A.calcoaceticus-baumannii NOT DETECTED NOT DETECTED Final   Bacteroides fragilis NOT DETECTED NOT DETECTED Final   Enterobacterales DETECTED (A) NOT DETECTED Final    Comment:  CRITICAL RESULT CALLED TO, READ BACK BY AND VERIFIED WITH: Guadlupe Spanish PharmD 10:00 11/08/19 ([email or web address])    Enterobacter cloacae complex NOT DETECTED NOT DETECTED Final   Escherichia coli DETECTED (A) NOT DETECTED Final    Comment: CRITICAL RESULT CALLED TO, READ BACK BY AND VERIFIED WITH: Guadlupe Spanish PharmD 10:00 11/08/19 ([email or web address])    Klebsiella aerogenes NOT DETECTED NOT DETECTED Final   Klebsiella oxytoca NOT DETECTED NOT DETECTED Final   Klebsiella pneumoniae NOT DETECTED NOT DETECTED Final   Proteus species DETECTED (A) NOT DETECTED Final    Comment: CRITICAL RESULT CALLED TO, READ BACK BY AND VERIFIED WITH: Guadlupe Spanish PharmD 10:00 11/08/19 ([email or web address])    Salmonella species NOT DETECTED NOT DETECTED Final   Serratia marcescens NOT DETECTED NOT DETECTED Final   Haemophilus influenzae NOT DETECTED NOT DETECTED Final   Neisseria meningitidis NOT DETECTED NOT DETECTED Final   Pseudomonas aeruginosa NOT DETECTED NOT DETECTED Final   Stenotrophomonas maltophilia NOT DETECTED NOT DETECTED Final   Candida albicans NOT DETECTED NOT DETECTED Final   Candida auris NOT DETECTED NOT DETECTED Final   Candida glabrata NOT DETECTED NOT DETECTED Final   Candida krusei NOT DETECTED NOT DETECTED Final   Candida parapsilosis NOT DETECTED NOT DETECTED Final   Candida tropicalis NOT DETECTED NOT DETECTED Final   Cryptococcus neoformans/gattii NOT DETECTED NOT DETECTED Final   CTX-M ESBL NOT DETECTED NOT DETECTED Final   Carbapenem resistance IMP NOT DETECTED NOT DETECTED Final   Carbapenem resistance KPC NOT DETECTED NOT DETECTED Final   Carbapenem resistance  NDM NOT DETECTED NOT DETECTED Final   Carbapenem resist OXA 48 LIKE NOT DETECTED NOT DETECTED Final   Carbapenem resistance VIM NOT DETECTED NOT DETECTED Final    Comment: Performed at [facility], [address], [place], [place] [address]  Culture, blood (Routine x 2)     Status: Abnormal   Collection Time: 11/07/19  6:41 PM   Specimen: BLOOD  Result Value Ref Range Status   Specimen Description   Final    BLOOD LEFT ANTECUBITAL Performed at [facility] [address], [place], [place] [address]    Special Requests   Final    BOTTLES DRAWN AEROBIC AND ANAEROBIC Blood Culture adequate volume Performed at [facility] [address] [place], [place] [address]    Culture  Setup Time   Final    GRAM NEGATIVE RODS IN BOTH AEROBIC AND ANAEROBIC BOTTLES CRITICAL VALUE NOTED.  VALUE IS CONSISTENT WITH PREVIOUSLY REPORTED AND CALLED VALUE.    Culture (A)  Final    ESCHERICHIA COLI SUSCEPTIBILITIES PERFORMED ON PREVIOUS CULTURE WITHIN THE LAST 5 DAYS. Performed at [facility], [place] [address], [place], [place] [address]    Report Status 11/10/2019 FINAL  Final  Urine culture     Status: Abnormal   Collection Time: 11/07/19  6:45 PM   Specimen: Urine, Random  Result Value Ref Range Status   Specimen Description   Final    URINE, RANDOM Performed at [facility] [address], [place], [place] [address]    Special Requests   Final    NONE Performed at [facility], [place] [address], [place], [place] [address]    Culture (A)  Final    70,000 COLONIES/mL ESCHERICHIA COLI >=100,000 COLONIES/mL PSEUDOMONAS AERUGINOSA    Report Status 11/10/2019 FINAL  Final   Organism ID, Bacteria ESCHERICHIA COLI (A)  Final   Organism ID, Bacteria PSEUDOMONAS AERUGINOSA (  A)  Final      Susceptibility   Escherichia coli - MIC*    AMPICILLIN <=2 SENSITIVE Sensitive     CEFAZOLIN <=4 SENSITIVE Sensitive      CEFTRIAXONE <=0.25 SENSITIVE Sensitive     CIPROFLOXACIN <=0.25 SENSITIVE Sensitive     GENTAMICIN <=1 SENSITIVE Sensitive     IMIPENEM <=0.25 SENSITIVE Sensitive     NITROFURANTOIN <=16 SENSITIVE Sensitive     TRIMETH/SULFA <=20 SENSITIVE Sensitive     AMPICILLIN/SULBACTAM <=2 SENSITIVE Sensitive     PIP/TAZO <=4 SENSITIVE Sensitive     * 70,000 COLONIES/mL ESCHERICHIA COLI   Pseudomonas aeruginosa - MIC*    CEFTAZIDIME 4 SENSITIVE Sensitive     CIPROFLOXACIN 0.5 SENSITIVE Sensitive     GENTAMICIN <=1 SENSITIVE Sensitive     IMIPENEM 1 SENSITIVE Sensitive     PIP/TAZO 8 SENSITIVE Sensitive     CEFEPIME 2 SENSITIVE Sensitive     * >=100,000 COLONIES/mL PSEUDOMONAS AERUGINOSA  SARS Coronavirus 2 by RT PCR (hospital order, performed in [facility] [place] hospital lab) Nasopharyngeal Nasopharyngeal Swab     Status: None   Collection Time: 11/07/19  7:08 PM   Specimen: Nasopharyngeal Swab  Result Value Ref Range Status   SARS Coronavirus 2 NEGATIVE NEGATIVE Final    Comment: (NOTE) SARS-CoV-2 target nucleic acids are NOT DETECTED.  The SARS-CoV-2 RNA is generally detectable in upper and lower respiratory specimens during the acute phase of infection. The lowest concentration of SARS-CoV-2 viral copies this assay can detect is 250 copies / mL. A negative result does not preclude SARS-CoV-2 infection and should not be used as the sole basis for treatment or other patient management decisions.  A negative result may occur with improper specimen collection / handling, submission of specimen other than nasopharyngeal swab, presence of viral mutation(s) within the areas targeted by this assay, and inadequate number of viral copies (<250 copies / mL). A negative result must be combined with clinical observations, patient history, and epidemiological information.  Fact Sheet for Patients:   StrictlyIdeas.no  Fact Sheet for Healthcare  Providers: BankingDealers.co.za  This test is not yet approved or  cleared by the [place] FDA and has been authorized for detection and/or diagnosis of SARS-CoV-2 by FDA under an Emergency Use Authorization (EUA).  This EUA will remain in effect (meaning this test can be used) for the duration of the COVID-19 declaration under Section 564(b)(1) of the Act, 21 U.S.C. section 360bbb-3(b)(1), unless the authorization is terminated or revoked sooner.  Performed at [facility], [place] [address] [place], [place] [address]   MRSA PCR Screening     Status: None   Collection Time: 11/08/19  2:35 AM   Specimen: Nasal Mucosa; Nasopharyngeal  Result Value Ref Range Status   MRSA by PCR NEGATIVE NEGATIVE Final    Comment:        The GeneXpert MRSA Assay (FDA approved for NASAL specimens only), is one component of a comprehensive MRSA colonization surveillance program. It is not intended to diagnose MRSA infection nor to guide or monitor treatment for MRSA infections. Performed at [facility], [place] [address], [place], [place] [address]          Radiology Studies: No results found.      Scheduled Meds: . Chlorhexidine Gluconate Cloth  6 each Topical Daily  . DULoxetine  60 mg Oral Daily  . feeding supplement (KATE FARMS STANDARD 1.4)  325 mL Oral BID BM  . ferrous sulfate  325 mg Oral Q breakfast  . gabapentin  300 mg Oral BID  . insulin aspart  0-9 Units Subcutaneous TID WC  . midodrine  5 mg Oral TID AC  . multivitamin with minerals  1 tablet Oral Daily  . pantoprazole  40 mg Oral BID  . sodium bicarbonate  1,950 mg Oral TID  . tamsulosin  0.4 mg Oral Daily   Continuous Infusions: . ceFEPime (MAXIPIME) IV Stopped (11/10/19 0939)     LOS: 3 days   Time spent= 35 mins    Saul Dorsi Arsenio Loader, MD [facility]  If 7PM-7AM, please contact night-coverage  11/11/2019, 7:56 AM

## 2019-11-11 NOTE — Evaluation (Signed)
Physical Therapy Evaluation Patient Details Name: Chase [name] MRN: [id number] DOB: 1965/10/24 Today's Date: 11/11/2019   History of Present Illness  54 yo M with a history of BPH with chronic indwelling foley catheter, recent CVA, CKD,PTSD, cervical radiculapathy, chronic pain, history of Hodgkin's lymphoma (treated with ABVD and radiation), who presented 11/07/19 fro [facility] for low blood pressure and fever  Clinical Impression  The patient is participatory in mobility and mobilizing to sitting on bed edge. Patient sat x 12 minutes with occassional support  For balance. Patient presents with significant LLE paresis, posture is kyphotic. Patient reports painful sore on coccyx area. Patient eager to return to his rehab facility. Pt admitted with above diagnosis.  Pt currently with functional limitations due to the deficits listed below (see PT Problem List). Pt will benefit from skilled PT to increase their independence and safety with mobility to allow discharge to the venue listed below.   BP's stable:supine 94/62 HR 49, sit:108,66 HR 74, 100% SPO2 on RA.    Follow Up Recommendations SNF    Equipment Recommendations  None recommended by PT    Recommendations for Other Services       Precautions / Restrictions Precautions Precautions: Fall Precaution Comments: monitor BP, coccyx wound which is painful      Mobility  Bed Mobility Overal bed mobility: Needs Assistance Bed Mobility: Rolling Rolling: Min assist;Mod assist   Supine to sit: Max assist;+2 for safety/equipment;+2 for physical assistance Sit to supine: Max assist;+2 for physical assistance;+2 for safety/equipment   General bed mobility comments: mod to left and min to right to roll,assist with left leg to flex in prep to roll to right. Assist with legs to bed edge, Patient assisted with pushing up onto Right UE , required mod assist to fully sit upright.. Verbal cues to go down to right  elbow , then  assisted with legs onto bed to return to sidelying for return to supine  Transfers                    Ambulation/Gait                Stairs            Wheelchair Mobility    Modified Rankin (Stroke Patients Only)       Balance Overall balance assessment: Needs assistance Sitting-balance support: Feet supported;Single extremity supported;Bilateral upper extremity supported Sitting balance-Leahy Scale: Poor Sitting balance - Comments: intermittent support when leaning to left. Tactile/VC for self positioning posture near to midline, for holding head more erect Postural control: Left lateral lean     Standing balance comment: NT                             Pertinent Vitals/Pain Faces Pain Scale: Hurts even more Pain Location: coccyx/buttock, foley catheter Pain Descriptors / Indicators: Discomfort;Grimacing;Guarding;Tender Pain Intervention(s): Monitored during session    Home Living Family/patient expects to be discharged to:: Skilled nursing facility                 Additional Comments: plans to return to SNF for rehab    Prior Function                 Hand Dominance        Extremity/Trunk Assessment        Lower Extremity Assessment Lower Extremity Assessment: LLE deficits/detail RLE Deficits / Details: has trace  active dorsiflex  through 25% range, hip flex 2/5, Knee ext 1+/5 RLE Coordination: decreased fine motor;decreased gross motor       Communication      Cognition Arousal/Alertness: Awake/alert Behavior During Therapy: Flat affect                       Current Attention Level: Selective   Following Commands: Follows one step commands consistently   Awareness: Emergent Problem Solving: Slow processing;Decreased initiation;Difficulty sequencing;Requires verbal cues;Requires tactile cues General Comments: keeps eyes closed most ofthe time      General Comments      Exercises      Assessment/Plan    PT Assessment Patient needs continued PT services  PT Problem List Decreased strength;Decreased activity tolerance;Decreased balance;Decreased mobility;Decreased coordination;Decreased cognition;Decreased knowledge of use of DME;Decreased safety awareness;Decreased knowledge of precautions;Pain       PT Treatment Interventions DME instruction;Functional mobility training;Therapeutic activities;Therapeutic exercise;Balance training;Neuromuscular re-education;Patient/family education    PT Goals (Current goals can be found in the Care Plan section)  Acute Rehab PT Goals Patient Stated Goal: to get back to the rehab. PT Goal Formulation: With patient Time For Goal Achievement: 11/25/19 Potential to Achieve Goals: Good    Frequency Min 2X/week (can bene from more)   Barriers to discharge        Co-evaluation PT/OT/SLP Co-Evaluation/Treatment: Yes Reason for Co-Treatment: For patient/therapist safety PT goals addressed during session: Mobility/safety with mobility OT goals addressed during session: ADL's and self-care       AM-PAC PT "6 Clicks" Mobility  Outcome Measure Help needed turning from your back to your side while in a flat bed without using bedrails?: A Lot Help needed moving from lying on your back to sitting on the side of a flat bed without using bedrails?: A Lot Help needed moving to and from a bed to a chair (including a wheelchair)?: Total Help needed standing up from a chair using your arms (e.g., wheelchair or bedside chair)?: Total Help needed to walk in hospital room?: Total Help needed climbing 3-5 steps with a railing? : Total 6 Click Score: 8    End of Session   Activity Tolerance: Patient tolerated treatment well;Patient limited by fatigue Patient left: in bed;with call bell/phone within reach Nurse Communication: Mobility status PT Visit Diagnosis: Unsteadiness on feet (R26.81);Hemiplegia and hemiparesis;Muscle weakness  (generalized) (M62.81) Hemiplegia - Right/Left: Left Hemiplegia - dominant/non-dominant: Non-dominant Hemiplegia - caused by: Cerebral infarction    Time: 5027-7412 PT Time Calculation (min) (ACUTE ONLY): 34 min   Charges:   PT Evaluation $PT Eval Moderate Complexity: [place] Pager [phone] Office [phone]   [name] 11/11/2019, 11:03 AM

## 2019-11-11 NOTE — Progress Notes (Signed)
Occupational Therapy Evaluation  Patient with functional deficits listed below impacting safety and independence with self care. Patient requiring max A x2 for bed mobility, BP taken EOB 108/67. Patient tolerate sitting EOB for approx 12 minutes with multimodal cues for correcting posture due to L anterior/lateral lean with head in flexed position. Patient able to support himself with B UEs and min G assist in approx 30 sec increments before requiring increased assist. Patient wanting to get back to rehab as soon as he can, recommend continued acute OT services to maximize patient strength, balance, activity tolerance necessary for participating in ADLs.    11/11/19 1200  OT Visit Information  Last OT Received On 11/11/19  Assistance Needed +2  PT/OT/SLP Co-Evaluation/Treatment Yes  Reason for Co-Treatment For patient/therapist safety;To address functional/ADL transfers;Complexity of the patient's impairments (multi-system involvement)  OT goals addressed during session ADL's and self-care  History of Present Illness 54 yo M with a history of BPH with chronic indwelling foley catheter, recent CVA, CKD,PTSD, cervical radiculapathy, chronic pain, history of Hodgkin's lymphoma (treated with ABVD and radiation), who presented 11/07/19 fro [facility] for low blood pressure and fever  Precautions  Precautions Fall  Precaution Comments monitor BP, coccyx wound which is painful  Restrictions  Weight Bearing Restrictions No  Home Living  Family/patient expects to be discharged to: Skilled nursing facility  Prior Function  Comments prior to CVA in July patient reports he was mod I using cane for ambulation and performing ADLs without assist. Since CVA patient has been in rehab requiring assist x2 from therapy to transfer to recliner   Communication  Communication No difficulties  Pain Assessment  Pain Assessment Faces  Faces Pain Scale 6  Pain Location coccyx/buttock, foley catheter  Pain  Descriptors / Indicators Discomfort;Grimacing;Guarding;Tender  Pain Intervention(s) Monitored during session  Cognition  Arousal/Alertness Awake/alert  Behavior During Therapy Flat affect  Overall Cognitive Status Within Functional Limits for tasks assessed  General Comments pt keeping eyes closed, reports he gets headaches otherwise. following 1 step directions appropriately  Upper Extremity Assessment  Upper Extremity Assessment LUE deficits/detail;RUE deficits/detail  RUE Deficits / Details AROM grossly intact,  strength grossly 3/5  LUE Deficits / Details note trace movement with shoulder elevation and rectraction, elbow flexion/extension AROM and L grip functional in order to drink from cup   ADL  Overall ADL's  Needs assistance/impaired  Eating/Feeding Set up;Bed level  Eating/Feeding Details (indicate cue type and reason) to drink from cup  Grooming Set up;Bed level  Upper Body Bathing Maximal assistance;Sitting  Lower Body Bathing Maximal assistance;Sitting/lateral leans  Upper Body Dressing  Maximal assistance;Sitting  Lower Body Dressing Total assistance  Lower Body Dressing Details (indicate cue type and reason) patient reliant on B UE support and external support from min G to mod for posture/sitting balance  Toilet Transfer Details (indicate cue type and reason) did not assess at this time, patient reports requiring assist x2 with use "of machine" at rehab facility to transfer to recliner   Toileting- Clothing Manipulation and Hygiene Total assistance  General ADL Comments patient requiring increased assistance with self care due to decreased trunk control, strength, activity tolerance, coordination, and pain from sacral wound   Bed Mobility  Overal bed mobility Needs Assistance  Bed Mobility Rolling  Rolling Min assist;Mod assist  Supine to sit Max assist;+2 for safety/equipment;+2 for physical assistance  Sit to supine Max assist;+2 for physical assistance;+2 for  safety/equipment  General bed mobility comments mod to left and min to right  to roll, assist with left leg to flex in prep to roll to right. Assist with legs to bed edge, Patient assisted with pushing up onto Right UE , required mod assist to fully sit upright.. Verbal cues to go down to right  elbow , then assisted with legs onto bed to return to sidelying for return to supine  Transfers  General transfer comment deferred   Balance  Overall balance assessment Needs assistance  Sitting-balance support Feet supported;Single extremity supported;Bilateral upper extremity supported  Sitting balance-Leahy Scale Poor  Sitting balance - Comments intermittent support when leaning to left. Tactile/VC for correcting posture  Postural control Left lateral lean  General Comments  General comments (skin integrity, edema, etc.) BP semi-supine 94/62, seated EOB 108/67 with O2 at 100% on room air   OT - End of Session  Activity Tolerance Patient tolerated treatment well  Patient left in bed;with call bell/phone within reach  Nurse Communication Mobility status  OT Assessment  OT Recommendation/Assessment Patient needs continued OT Services  OT Visit Diagnosis Unsteadiness on feet (R26.81);Other abnormalities of gait and mobility (R26.89);Muscle weakness (generalized) (M62.81);Other symptoms and signs involving the nervous system (R29.898);Hemiplegia and hemiparesis  Hemiplegia - Right/Left Left  Hemiplegia - dominant/non-dominant Non-Dominant  Hemiplegia - caused by Cerebral infarction  OT Problem List Decreased strength;Decreased range of motion;Impaired balance (sitting and/or standing);Impaired vision/perception;Decreased coordination;Decreased safety awareness;Decreased knowledge of use of DME or AE;Impaired UE functional use  OT Plan  OT Frequency (ACUTE ONLY) Min 2X/week  OT Treatment/Interventions (ACUTE ONLY) Self-care/ADL training;Neuromuscular education;Energy conservation;DME and/or AE  instruction;Manual therapy;Therapeutic activities;Visual/perceptual remediation/compensation;Patient/family education;Balance training  AM-PAC OT "6 Clicks" Daily Activity Outcome Measure (Version 2)  Help from another person eating meals? 3  Help from another person taking care of personal grooming? 3  Help from another person toileting, which includes using toliet, bedpan, or urinal? 1  Help from another person bathing (including washing, rinsing, drying)? 2  Help from another person to put on and taking off regular upper body clothing? 2  Help from another person to put on and taking off regular lower body clothing? 1  6 Click Score 12  OT Recommendation  Follow Up Recommendations SNF;Supervision/Assistance - 24 hour  OT Equipment Other (comment) (TBD)  Individuals Consulted  Consulted and Agree with Results and Recommendations Patient  Acute Rehab OT Goals  Patient Stated Goal to get back to the rehab.  OT Goal Formulation With patient  Time For Goal Achievement 11/25/19  Potential to Achieve Goals Good  OT Time Calculation  OT Start Time (ACUTE ONLY) 0738  OT Stop Time (ACUTE ONLY) 0816  OT Time Calculation (min) 38 min  OT General Charges  $OT Visit 1 Visit  OT Evaluation  $OT Eval Moderate Complexity 1 Mod  OT Treatments  $Self Care/Home Management  8-22 mins  Written Expression  Dominant Hand Right   Delbert Phenix OT OT pager: [phone]

## 2019-11-12 ENCOUNTER — Inpatient Hospital Stay (HOSPITAL_COMMUNITY): Payer: No Typology Code available for payment source

## 2019-11-12 LAB — CBC
HCT: 25.3 % — ABNORMAL LOW (ref 39.0–52.0)
Hemoglobin: 7.9 g/dL — ABNORMAL LOW (ref 13.0–17.0)
MCH: 29.4 pg (ref 26.0–34.0)
MCHC: 31.2 g/dL (ref 30.0–36.0)
MCV: 94.1 fL (ref 80.0–100.0)
Platelets: 97 10*3/uL — ABNORMAL LOW (ref 150–400)
RBC: 2.69 MIL/uL — ABNORMAL LOW (ref 4.22–5.81)
RDW: 19.7 % — ABNORMAL HIGH (ref 11.5–15.5)
WBC: 3.9 10*3/uL — ABNORMAL LOW (ref 4.0–10.5)
nRBC: 0 % (ref 0.0–0.2)

## 2019-11-12 LAB — BASIC METABOLIC PANEL
Anion gap: 12 (ref 5–15)
BUN: 54 mg/dL — ABNORMAL HIGH (ref 6–20)
CO2: 18 mmol/L — ABNORMAL LOW (ref 22–32)
Calcium: 7 mg/dL — ABNORMAL LOW (ref 8.9–10.3)
Chloride: 113 mmol/L — ABNORMAL HIGH (ref 98–111)
Creatinine, Ser: 2.82 mg/dL — ABNORMAL HIGH (ref 0.61–1.24)
GFR calc Af Amer: 28 mL/min — ABNORMAL LOW (ref >60)
GFR calc non Af Amer: 24 mL/min — ABNORMAL LOW (ref >60)
Glucose, Bld: 79 mg/dL (ref 70–99)
Potassium: 3.9 mmol/L (ref 3.5–5.1)
Sodium: 143 mmol/L (ref 135–145)

## 2019-11-12 LAB — GLUCOSE, CAPILLARY
Glucose-Capillary: 77 mg/dL (ref 70–99)
Glucose-Capillary: 101 mg/dL — ABNORMAL HIGH (ref 70–99)
Glucose-Capillary: 129 mg/dL — ABNORMAL HIGH (ref 70–99)
Glucose-Capillary: 79 mg/dL (ref 70–99)

## 2019-11-12 LAB — MAGNESIUM: Magnesium: 2.2 mg/dL (ref 1.7–2.4)

## 2019-11-12 NOTE — Progress Notes (Signed)
PROGRESS NOTE    Chase [name]  ZMO:[id number] DOB: 09-May-1965 DOA: 11/07/2019 PCP: [name]   Chief Complaint  Patient presents with  . 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  Initially found to be in septic shock from urosepsis started on pressors, urine cultures grew Pseudomonas and E. coli.  Initially on broad-spectrum antibiotics then transition to cefepime. He  Reports he had hematuria from traumatic pulling of foley when it got caught during bed transfer. But he reports he has had hematuria before.  Subjective: Still with hematuria in foley, draining well, uop 2 2725 ml. Feeling better, medicine helping with the pain Patient otherwise denies any nausea, vomiting, chest pain, shortness of breath, fever, chills, headache numbness tingling, speech difficulties. Hematuria is clearing up he thinks  Assessment & Plan:  Septic shock UTI, Urine culture with E coli and pseudomonas and Blood culture with E coli and proteus:Shock physiology resolved.  On midodrine.  On Cefepime and plan is to switch to ciprofloxacin on discharge to complete course with last 8/17.  Has had Foley for few weeks continue the indwelling Foley catheter in the setting of hematuria.  AKI on CKD stage IIIa chronic urine retention secondary to BPH.  Baseline creatinine 2.3 peaked to 3.0, now downtrending to 2.8.  Continue Foley draining well.  Off bicarb tablets.  Continue on Ringer's lactate infusion. Recent Labs  Lab 11/08/19 1136 11/09/19 0556 11/10/19 0932 11/11/19 0821 11/12/19 0358  BUN 53* 57* 62* 62* 54*  CREATININE 3.09* 3.08* 3.05* 3.02* 2.82*   Persistent hematuria/acute blood loss anemia suspect from traumatic Foley/chronic  urinary retention with Foley in place.  Followed by [facility] urology.  Off subcu heparin.  Still having hematuria with pinkish urine appears to be clearing.  Discussed with Dr. Alinda Money from urology.  Having good output continue to monitor.  Hemoglobin dropped as low as 6.2 needing 1 unit PRBC. Recent Labs  Lab 11/07/19 1836 11/08/19 0329 11/10/19 0932 11/11/19 0329 11/12/19 0358  HGB 9.8* 7.6* 6.2* 7.3* 7.9*   Recent CVA in July 2021 on aspirin, continue PT OT.  Chronic pain on home Norco, Cymbalta and Neurontin  Adult failure to thrive with history of gastric bypass, sacral stage II pressure ulcer no signs of infection.  Continue nutritional support augment, encourage ambulation PT OT.  GERD continue PPI.  DVT prophylaxis: Place and maintain sequential compression device Start: 11/10/19 1028 Code Status:   Code Status: Full Code  Family Communication: plan of care discussed with patient at bedside.  Status is: Inpatient  Remains inpatient appropriate because:IV treatments appropriate due to intensity of illness or inability to take PO and Inpatient level of care appropriate due to severity of illness   Dispo: The patient is from: SNF              Anticipated d/c is to: SNF              Anticipated d/c date is: 1 day              Patient currently is not medically stable to d/c.  Urology consulte due to persistent hematuria.  Continue to follow renal function   Nutrition: Diet Order            Diet  regular Room service appropriate? Yes; Fluid consistency: Thin  Diet effective now                 Nutrition Problem: Severe Malnutrition Etiology: chronic illness Signs/Symptoms: severe fat depletion, severe muscle depletion Interventions: Other (Comment) [employer]) Body mass index is 18.5 kg/m.  Consultants:see note  Procedures:see note Microbiology:see note Blood Culture    Component Value Date/Time   SDES  11/07/2019 1845    URINE, RANDOM Performed at [facility], [place] [address] [place], [place] [address]    SPECREQUEST  11/07/2019 1845    NONE Performed at [facility], [place] [address], [place], [place] [address]    CULT (A) 11/07/2019 1845    70,000 COLONIES/mL ESCHERICHIA COLI >=100,000 COLONIES/mL PSEUDOMONAS AERUGINOSA    REPTSTATUS 11/10/2019 FINAL 11/07/2019 1845    Other culture-see note  Medications: Scheduled Meds: . Chlorhexidine Gluconate Cloth  6 each Topical Daily  . DULoxetine  60 mg Oral Daily  . feeding supplement (KATE FARMS STANDARD 1.4)  325 mL Oral BID BM  . ferrous sulfate  325 mg Oral Q breakfast  . gabapentin  300 mg Oral BID  . insulin aspart  0-9 Units Subcutaneous TID WC  . midodrine  5 mg Oral TID AC  . multivitamin with minerals  1 tablet Oral Daily  . pantoprazole  40 mg Oral BID  . tamsulosin  0.4 mg Oral Daily   Continuous Infusions: . ceFEPime (MAXIPIME) IV 2 g (11/12/19 0814)  . lactated ringers 75 mL/hr at 11/11/19 2041    Antimicrobials: Anti-infectives (From admission, onward)   Start     Dose/Rate Route Frequency Ordered Stop   11/10/19 0830  ceFEPIme (MAXIPIME) 2 g in sodium chloride 0.9 % 100 mL IVPB     Discontinue     2 g 200 mL/hr over 30 Minutes Intravenous Every 24 hours 11/10/19 0815     11/08/19 2000  ceFEPIme (MAXIPIME) 2 g in sodium chloride 0.9 % 100 mL IVPB  Status:  Discontinued        2 g 200 mL/hr over 30 Minutes Intravenous Every 24 hours 11/08/19 0025 11/08/19 1037   11/08/19 2000  vancomycin (VANCOREADY) IVPB 500 mg/100 mL  Status:  Discontinued        500 mg 100 mL/hr over 60 Minutes Intravenous Every 24 hours 11/08/19 0027 11/08/19 1037   11/08/19 1800  cefTRIAXone (ROCEPHIN) 2 g in sodium chloride 0.9 % 100 mL IVPB  Status:  Discontinued        2 g 200 mL/hr over 30 Minutes Intravenous Every 24 hours 11/08/19 1037 11/10/19 0815   11/08/19 0300  ceFEPIme (MAXIPIME) 1 g in sodium chloride 0.9 % 100 mL IVPB  Status:  Discontinued         1 g 200 mL/hr over 30 Minutes Intravenous Every 12 hours 11/08/19 0021 11/08/19 0025   11/07/19 1930  vancomycin (VANCOREADY) IVPB 1250 mg/250 mL        1,250 mg 166.7 mL/hr over 90 Minutes Intravenous  Once 11/07/19 1858 11/07/19 2154   11/07/19 1900  ceFEPIme (MAXIPIME) 2 g in sodium chloride 0.9 % 100 mL IVPB        2 g 200 mL/hr over 30 Minutes Intravenous  Once 11/07/19 1854 11/07/19 2009   11/07/19 1900  metroNIDAZOLE (FLAGYL) IVPB 500 mg        500 mg 100 mL/hr over 60 Minutes Intravenous  Once 11/07/19 1854 11/07/19 2325  11/07/19 1900  vancomycin (VANCOCIN) IVPB 1000 mg/200 mL premix  Status:  Discontinued        1,000 mg 200 mL/hr over 60 Minutes Intravenous  Once 11/07/19 1854 11/07/19 1858       Objective: Vitals: Today's Vitals   11/12/19 0441 11/12/19 0452 11/12/19 0552 11/12/19 0930  BP: 103/66     Pulse: (!) 59     Resp: 15     Temp: 98.7 F (37.1 C)     TempSrc: Oral     SpO2: 99%     Weight:      Height:      PainSc:  9  Asleep 9     Intake/Output Summary (Last 24 hours) at 11/12/2019 0941 Last data filed at 11/12/2019 0441 Gross per 24 hour  Intake 1222 ml  Output 2725 ml  Net -1503 ml   Filed Weights   11/07/19 1832 11/11/19 0500  Weight: 55 kg 55.2 kg   Weight change:    Intake/Output from previous day: 08/12 0701 - 08/13 0700 In: 1222 [P.O.:1122; IV Piggyback:100] Out: 2725 [Urine:2725] Intake/Output this shift: No intake/output data recorded.  Examination:  General exam: AAO x3, weak, on RA, NAD, weak appearing. HEENT:Oral mucosa moist, Ear/Nose WNL grossly,dentition normal. Respiratory system: bilaterally clear,no wheezing or crackles,no use of accessory muscle, non tender. Cardiovascular system: S1 & S2 +, regular, No JVD. Gastrointestinal system: Abdomen soft, NT,ND, BS+. Nervous System:Alert, awake, moving RLE/RUE extremities well. LLE unable to lift from bed, only able to wiggle toes, LUE able to lift up elbow. Extremities:  No edema, distal peripheral pulses palpable.  Skin: No rashes,no icterus. MSK: Normal muscle bulk,tone, power Foley+ with pinkish urine  Data Reviewed: I have personally reviewed following labs and imaging studies CBC: Recent Labs  Lab 11/07/19 1836 11/08/19 0329 11/10/19 0932 11/11/19 0329 11/12/19 0358  WBC 3.3* 27.6* 9.9 8.7 3.9*  NEUTROABS 3.1 25.9*  --   --   --   HGB 9.8* 7.6* 6.2* 7.3* 7.9*  HCT 33.1* 25.0* 20.5* 23.3* 25.3*  MCV 98.5 96.9 94.9 93.6 94.1  PLT 178 163 113* 114* 97*   Basic Metabolic Panel: Recent Labs  Lab 11/08/19 0329 11/08/19 0329 11/08/19 1136 11/09/19 0556 11/10/19 0932 11/11/19 0821 11/12/19 0358  NA 128*   < > 134* 139 137 139 143  K 3.8   < > 4.3 3.6 4.0 3.6 3.9  CL 101   < > 109 112* 109 109 113*  CO2 13*   < > 13* 18* 19* 21* 18*  GLUCOSE 149*   < > 187* 192* 127* 110* 79  BUN 45*   < > 53* 57* 62* 62* 54*  CREATININE 3.06*   < > 3.09* 3.08* 3.05* 3.02* 2.82*  CALCIUM 7.0*   < > 6.9* 7.6* 7.5* 7.4* 7.0*  MG 1.3*  --   --   --   --  2.4 2.2  PHOS 4.4  --   --   --   --   --   --    < > = values in this interval not displayed.   GFR: Estimated Creatinine Clearance: 23.1 mL/min (A) (by C-G formula based on SCr of 2.82 mg/dL (H)). Liver Function Tests: Recent Labs  Lab 11/07/19 1836 11/10/19 0932  AST 36 17  ALT 53* 41  ALKPHOS 180* 111  BILITOT 0.6 0.4  PROT 7.5 5.6*  ALBUMIN 2.7* 1.8*   No results for input(s): LIPASE, AMYLASE in the last 168 hours. No  results for input(s): AMMONIA in the last 168 hours. Coagulation Profile: Recent Labs  Lab 11/07/19 1836 11/10/19 1052  INR 1.2 1.3*   Cardiac Enzymes: No results for input(s): CKTOTAL, CKMB, CKMBINDEX, TROPONINI in the last 168 hours. BNP (last 3 results) No results for input(s): PROBNP in the last 8760 hours. HbA1C: No results for input(s): HGBA1C in the last 72 hours. CBG: Recent Labs  Lab 11/11/19 0738 11/11/19 1132 11/11/19 1617 11/11/19 2104  11/12/19 0747  GLUCAP 99 95 82 90 77   Lipid Profile: No results for input(s): CHOL, HDL, LDLCALC, TRIG, CHOLHDL, LDLDIRECT in the last 72 hours. Thyroid Function Tests: No results for input(s): TSH, T4TOTAL, FREET4, T3FREE, THYROIDAB in the last 72 hours. Anemia Panel: No results for input(s): VITAMINB12, FOLATE, FERRITIN, TIBC, IRON, RETICCTPCT in the last 72 hours. Sepsis Labs: Recent Labs  Lab 11/07/19 1836 11/07/19 2036 11/08/19 0329 11/08/19 0724  LATICACIDVEN 4.0* 4.1* 4.9* 4.4*    Recent Results (from the past 240 hour(s))  Culture, blood (Routine x 2)     Status: Abnormal   Collection Time: 11/07/19  6:36 PM   Specimen: BLOOD  Result Value Ref Range Status   Specimen Description   Final    BLOOD RIGHT ANTECUBITAL Performed at [facility], [place] [address], [place], [place] [address]    Special Requests   Final    BOTTLES DRAWN AEROBIC AND ANAEROBIC Blood Culture adequate volume Performed at [facility] [address], [place], [place] [address]    Culture  Setup Time   Final    GRAM NEGATIVE RODS IN BOTH AEROBIC AND ANAEROBIC BOTTLES CRITICAL RESULT CALLED TO, READ BACK BY AND VERIFIED WITH: Guadlupe Spanish PharmD 10:00 11/08/19 ([email or web address]) Performed at [facility], [address], [place],  [address]    Culture ESCHERICHIA COLI PROTEUS MIRABILIS  (A)  Final   Report Status 11/10/2019 FINAL  Final   Organism ID, Bacteria ESCHERICHIA COLI  Final   Organism ID, Bacteria PROTEUS MIRABILIS  Final      Susceptibility   Escherichia coli - MIC*    AMPICILLIN <=2 SENSITIVE Sensitive     CEFAZOLIN <=4 SENSITIVE Sensitive     CEFEPIME <=0.12 SENSITIVE Sensitive     CEFTAZIDIME <=1 SENSITIVE Sensitive     CEFTRIAXONE <=0.25 SENSITIVE Sensitive     CIPROFLOXACIN <=0.25 SENSITIVE Sensitive     GENTAMICIN <=1 SENSITIVE Sensitive     IMIPENEM <=0.25 SENSITIVE Sensitive     TRIMETH/SULFA <=20 SENSITIVE Sensitive      AMPICILLIN/SULBACTAM <=2 SENSITIVE Sensitive     PIP/TAZO <=4 SENSITIVE Sensitive     * ESCHERICHIA COLI   Proteus mirabilis - MIC*    AMPICILLIN <=2 SENSITIVE Sensitive     CEFAZOLIN <=4 SENSITIVE Sensitive     CEFEPIME <=0.12 SENSITIVE Sensitive     CEFTAZIDIME <=1 SENSITIVE Sensitive     CEFTRIAXONE <=0.25 SENSITIVE Sensitive     CIPROFLOXACIN <=0.25 SENSITIVE Sensitive     GENTAMICIN <=1 SENSITIVE Sensitive     IMIPENEM 2 SENSITIVE Sensitive     TRIMETH/SULFA <=20 SENSITIVE Sensitive     AMPICILLIN/SULBACTAM <=2 SENSITIVE Sensitive     PIP/TAZO <=4 SENSITIVE Sensitive     * PROTEUS MIRABILIS  Blood Culture ID Panel (Reflexed)     Status: Abnormal   Collection Time: 11/07/19  6:36 PM  Result Value Ref Range Status   Enterococcus faecalis NOT DETECTED NOT DETECTED Final   Enterococcus Faecium NOT DETECTED NOT DETECTED Final  Listeria monocytogenes NOT DETECTED NOT DETECTED Final   Staphylococcus species NOT DETECTED NOT DETECTED Final   Staphylococcus aureus (BCID) NOT DETECTED NOT DETECTED Final   Staphylococcus epidermidis NOT DETECTED NOT DETECTED Final   Staphylococcus lugdunensis NOT DETECTED NOT DETECTED Final   Streptococcus species NOT DETECTED NOT DETECTED Final   Streptococcus agalactiae NOT DETECTED NOT DETECTED Final   Streptococcus pneumoniae NOT DETECTED NOT DETECTED Final   Streptococcus pyogenes NOT DETECTED NOT DETECTED Final   A.calcoaceticus-baumannii NOT DETECTED NOT DETECTED Final   Bacteroides fragilis NOT DETECTED NOT DETECTED Final   Enterobacterales DETECTED (A) NOT DETECTED Final    Comment: CRITICAL RESULT CALLED TO, READ BACK BY AND VERIFIED WITH: Guadlupe Spanish PharmD 10:00 11/08/19 ([email or web address])    Enterobacter cloacae complex NOT DETECTED NOT DETECTED Final   Escherichia coli DETECTED (A) NOT DETECTED Final    Comment: CRITICAL RESULT CALLED TO, READ BACK BY AND VERIFIED WITH: Guadlupe Spanish PharmD 10:00 11/08/19 ([email or web address])    Klebsiella aerogenes NOT  DETECTED NOT DETECTED Final   Klebsiella oxytoca NOT DETECTED NOT DETECTED Final   Klebsiella pneumoniae NOT DETECTED NOT DETECTED Final   Proteus species DETECTED (A) NOT DETECTED Final    Comment: CRITICAL RESULT CALLED TO, READ BACK BY AND VERIFIED WITH: Guadlupe Spanish PharmD 10:00 11/08/19 ([email or web address])    Salmonella species NOT DETECTED NOT DETECTED Final   Serratia marcescens NOT DETECTED NOT DETECTED Final   Haemophilus influenzae NOT DETECTED NOT DETECTED Final   Neisseria meningitidis NOT DETECTED NOT DETECTED Final   Pseudomonas aeruginosa NOT DETECTED NOT DETECTED Final   Stenotrophomonas maltophilia NOT DETECTED NOT DETECTED Final   Candida albicans NOT DETECTED NOT DETECTED Final   Candida auris NOT DETECTED NOT DETECTED Final   Candida glabrata NOT DETECTED NOT DETECTED Final   Candida krusei NOT DETECTED NOT DETECTED Final   Candida parapsilosis NOT DETECTED NOT DETECTED Final   Candida tropicalis NOT DETECTED NOT DETECTED Final   Cryptococcus neoformans/gattii NOT DETECTED NOT DETECTED Final   CTX-M ESBL NOT DETECTED NOT DETECTED Final   Carbapenem resistance IMP NOT DETECTED NOT DETECTED Final   Carbapenem resistance KPC NOT DETECTED NOT DETECTED Final   Carbapenem resistance NDM NOT DETECTED NOT DETECTED Final   Carbapenem resist OXA 48 LIKE NOT DETECTED NOT DETECTED Final   Carbapenem resistance VIM NOT DETECTED NOT DETECTED Final    Comment: Performed at [facility], [address], [place], [place] [address]  Culture, blood (Routine x 2)     Status: Abnormal   Collection Time: 11/07/19  6:41 PM   Specimen: BLOOD  Result Value Ref Range Status   Specimen Description   Final    BLOOD LEFT ANTECUBITAL Performed at [facility] [address], [place], [place] [address]    Special Requests   Final    BOTTLES DRAWN AEROBIC AND ANAEROBIC Blood Culture adequate volume Performed at [facility] [address], [place],  [place] [address]    Culture  Setup Time   Final    GRAM NEGATIVE RODS IN BOTH AEROBIC AND ANAEROBIC BOTTLES CRITICAL VALUE NOTED.  VALUE IS CONSISTENT WITH PREVIOUSLY REPORTED AND CALLED VALUE.    Culture (A)  Final    ESCHERICHIA COLI SUSCEPTIBILITIES PERFORMED ON PREVIOUS CULTURE WITHIN THE LAST 5 DAYS. Performed at [facility], [place] [address], [place], [place] [address]    Report Status 11/10/2019 FINAL  Final  Urine culture     Status: Abnormal   Collection  Time: 11/07/19  6:45 PM   Specimen: Urine, Random  Result Value Ref Range Status   Specimen Description   Final    URINE, RANDOM Performed at [facility] [address], [place], [place] [address]    Special Requests   Final    NONE Performed at [facility], [place] [address] [place], [place] [address]    Culture (A)  Final    70,000 COLONIES/mL ESCHERICHIA COLI >=100,000 COLONIES/mL PSEUDOMONAS AERUGINOSA    Report Status 11/10/2019 FINAL  Final   Organism ID, Bacteria ESCHERICHIA COLI (A)  Final   Organism ID, Bacteria PSEUDOMONAS AERUGINOSA (A)  Final      Susceptibility   Escherichia coli - MIC*    AMPICILLIN <=2 SENSITIVE Sensitive     CEFAZOLIN <=4 SENSITIVE Sensitive     CEFTRIAXONE <=0.25 SENSITIVE Sensitive     CIPROFLOXACIN <=0.25 SENSITIVE Sensitive     GENTAMICIN <=1 SENSITIVE Sensitive     IMIPENEM <=0.25 SENSITIVE Sensitive     NITROFURANTOIN <=16 SENSITIVE Sensitive     TRIMETH/SULFA <=20 SENSITIVE Sensitive     AMPICILLIN/SULBACTAM <=2 SENSITIVE Sensitive     PIP/TAZO <=4 SENSITIVE Sensitive     * 70,000 COLONIES/mL ESCHERICHIA COLI   Pseudomonas aeruginosa - MIC*    CEFTAZIDIME 4 SENSITIVE Sensitive     CIPROFLOXACIN 0.5 SENSITIVE Sensitive     GENTAMICIN <=1 SENSITIVE Sensitive     IMIPENEM 1 SENSITIVE Sensitive     PIP/TAZO 8 SENSITIVE Sensitive     CEFEPIME 2 SENSITIVE Sensitive     * >=100,000 COLONIES/mL PSEUDOMONAS AERUGINOSA  SARS Coronavirus 2  by RT PCR (hospital order, performed in [facility] hospital lab) Nasopharyngeal Nasopharyngeal Swab     Status: None   Collection Time: 11/07/19  7:08 PM   Specimen: Nasopharyngeal Swab  Result Value Ref Range Status   SARS Coronavirus 2 NEGATIVE NEGATIVE Final    Comment: (NOTE) SARS-CoV-2 target nucleic acids are NOT DETECTED.  The SARS-CoV-2 RNA is generally detectable in upper and lower respiratory specimens during the acute phase of infection. The lowest concentration of SARS-CoV-2 viral copies this assay can detect is 250 copies / mL. A negative result does not preclude SARS-CoV-2 infection and should not be used as the sole basis for treatment or other patient management decisions.  A negative result may occur with improper specimen collection / handling, submission of specimen other than nasopharyngeal swab, presence of viral mutation(s) within the areas targeted by this assay, and inadequate number of viral copies (<250 copies / mL). A negative result must be combined with clinical observations, patient history, and epidemiological information.  Fact Sheet for Patients:   StrictlyIdeas.no  Fact Sheet for Healthcare Providers: BankingDealers.co.za  This test is not yet approved or  cleared by the [place] FDA and has been authorized for detection and/or diagnosis of SARS-CoV-2 by FDA under an Emergency Use Authorization (EUA).  This EUA will remain in effect (meaning this test can be used) for the duration of the COVID-19 declaration under Section 564(b)(1) of the Act, 21 U.S.C. section 360bbb-3(b)(1), unless the authorization is terminated or revoked sooner.  Performed at [facility], [place] [address], [place], [place] [address]   MRSA PCR Screening     Status: None   Collection Time: 11/08/19  2:35 AM   Specimen: Nasal Mucosa; Nasopharyngeal  Result Value Ref Range Status   MRSA by PCR NEGATIVE  NEGATIVE Final    Comment:  The GeneXpert MRSA Assay (FDA approved for NASAL specimens only), is one component of a comprehensive MRSA colonization surveillance program. It is not intended to diagnose MRSA infection nor to guide or monitor treatment for MRSA infections. Performed at [facility], [place] [address] [place], [place] [address]       Radiology Studies: No results found.   LOS: 4 days   Antonieta Pert, MD [facility]  11/12/2019, 9:41 AM

## 2019-11-12 NOTE — Consult Note (Signed)
Urology Consult   Physician requesting consult: Dr. Antonieta Pert  Reason for consult: Hematuria  History of Present Illness: Chase [name] is a 54 y.o. with a history of a stroke approximately 1 month ago resulting in hospitalization in [place], [place] where his son lives.  He has had an indwelling catheter for approximately 1 month that was initially placed at [facility] in [place], [place] due to urinary retention.  He reportedly underwent cystoscopy by urology there and was told that he had a possible mass in the back of his bladder.  He is fairly adamant that this was not a mass within his bladder although we do not have any records from that hospital stay.  He was subsequently discharged to a skilled nursing facility.  His catheter has been changed since his admission here.  He has had persistent gross hematuria prompting urologic consultation.  In addition, he had a recent CT scan that demonstrated bilateral hydronephrosis despite his indwelling catheter.  He also had a possible mass within his bladder which looked quite irregular.  He is now admitted to the hospital with sepsis requiring resuscitation in the intensive care unit.  This was felt to be related to a urinary tract source was Proteus and E. coli growing out of his urine and blood cultures.  He does apparently have a history of BPH and lower urinary tract symptoms and has been on tamsulosin.  Furthermore, he states that he has had to have a catheter previously as well.  He has had hematuria in the past when he has required a catheter.  He also has a history of chronic kidney disease with a baseline creatinine of approximately 2.5-2.8.    Past Medical History:  Diagnosis Date  . Cervical radiculopathy    with left sided weakness  . Chemical exposure    service related injury  . Chronic pain   . GIB (gastrointestinal bleeding)   . PTSD (post-traumatic stress disorder)   . Renal disorder    CKD  . Urinary  retention    due to bladder outlet obstruction    Past Surgical History:  Procedure Laterality Date  . GASTRIC BYPASS    . HERNIA REPAIR       Current Hospital Medications:  Home meds:  No current facility-administered medications on file prior to encounter.   Current Outpatient Medications on File Prior to Encounter  Medication Sig Dispense Refill  . cyanocobalamin (,VITAMIN B-12,) 1000 MCG/ML injection Inject 1,000 mcg into the muscle every 30 (thirty) days.     . DULoxetine (CYMBALTA) 30 MG capsule Take 30-60 mg by mouth See admin instructions. Take 2 capsules in the morning and 1 capsule in the evening    . feeding supplement, ENSURE ENLIVE, (ENSURE ENLIVE) LIQD Take 237 mLs by mouth 3 (three) times daily between meals. 237 mL 12  . ferrous sulfate 325 (65 FE) MG tablet Take 325 mg by mouth daily.    Marland Kitchen gabapentin (NEURONTIN) 300 MG capsule Take 1 capsule (300 mg total) by mouth 2 (two) times daily.    Marland Kitchen HYDROcodone-acetaminophen (NORCO/VICODIN) 5-325 MG tablet Take 1 tablet by mouth every 4 (four) hours as needed (pain).    . hydrOXYzine (ATARAX/VISTARIL) 10 MG tablet Take 1 tablet (10 mg total) by mouth 3 (three) times daily as needed for itching or anxiety. 30 tablet 0  . midodrine (PROAMATINE) 2.5 MG tablet Take 1 tablet (2.5 mg total) by mouth 3 (three) times daily.    Marland Kitchen  Multiple Vitamin (MULTIVITAMIN WITH MINERALS) TABS tablet Take 1 tablet by mouth daily.    . pantoprazole (PROTONIX) 40 MG tablet Take 40 mg by mouth 2 (two) times daily.     . promethazine (PHENERGAN) 25 MG tablet Take 25 mg by mouth daily as needed for nausea or vomiting.    . sodium bicarbonate 650 MG tablet Take 3 tablets (1,950 mg total) by mouth 3 (three) times daily.    . tamsulosin (FLOMAX) 0.4 MG CAPS capsule Take 0.4 mg by mouth daily.     Marland Kitchen aspirin EC 81 MG EC tablet Take 1 tablet (81 mg total) by mouth daily. Swallow whole. (Patient not taking: Reported on 11/07/2019)    . traMADol (ULTRAM) 50 MG  tablet Take 1 tablet (50 mg total) by mouth every 6 (six) hours as needed. (Patient not taking: Reported on 11/07/2019) 30 tablet 0     Scheduled Meds: . Chlorhexidine Gluconate Cloth  6 each Topical Daily  . DULoxetine  60 mg Oral Daily  . feeding supplement (KATE FARMS STANDARD 1.4)  325 mL Oral BID BM  . ferrous sulfate  325 mg Oral Q breakfast  . gabapentin  300 mg Oral BID  . insulin aspart  0-9 Units Subcutaneous TID WC  . midodrine  5 mg Oral TID AC  . multivitamin with minerals  1 tablet Oral Daily  . pantoprazole  40 mg Oral BID  . tamsulosin  0.4 mg Oral Daily   Continuous Infusions: . ceFEPime (MAXIPIME) IV 2 g (11/12/19 0814)  . lactated ringers 75 mL/hr at 11/12/19 1338   PRN Meds:.docusate sodium, HYDROcodone-acetaminophen, hydrOXYzine, polyethylene glycol, senna-docusate  Allergies:  Allergies  Allergen Reactions  . Nsaids Other (See Comments)    Bleeding GI Ulceration    Family History  Problem Relation Age of Onset  . Renal cancer Father   . Heart disease Paternal Grandfather     Social History:  reports that he has never smoked. He uses smokeless tobacco. He reports that he does not drink alcohol and does not use drugs.  ROS: A complete review of systems was performed.  All systems are negative except for pertinent findings as noted.  Physical Exam:  Vital signs in last 24 hours: Temp:  [98.3 F (36.8 C)-98.7 F (37.1 C)] 98.3 F (36.8 C) (08/13 1439) Pulse Rate:  [59-72] 72 (08/13 1439) Resp:  [15-18] 18 (08/13 1439) BP: (100-117)/(63-68) 117/68 (08/13 1439) SpO2:  [98 %-99 %] 98 % (08/13 1439) Constitutional:  Alert and oriented, No acute distress Cardiovascular: No JVD Respiratory: Normal respiratory effort GI: Abdomen is soft, nontender, nondistended, no abdominal masses GU: No CVA tenderness, he has an indwelling 20 French Foley catheter with grossly red urine Lymphatic: No lymphadenopathy Neurologic:  No focal deficits Psychiatric: Normal  mood and affect  Laboratory Data:  Recent Labs    11/10/19 0932 11/11/19 0329 11/12/19 0358  WBC 9.9 8.7 3.9*  HGB 6.2* 7.3* 7.9*  HCT 20.5* 23.3* 25.3*  PLT 113* 114* 97*    Recent Labs    11/10/19 0932 11/11/19 0821 11/12/19 0358  NA 137 139 143  K 4.0 3.6 3.9  CL 109 109 113*  GLUCOSE 127* 110* 79  BUN 62* 62* 54*  CALCIUM 7.5* 7.4* 7.0*  CREATININE 3.05* 3.02* 2.82*     Results for orders placed or performed during the hospital encounter of 11/07/19 (from the past 24 hour(s))  Glucose, capillary     Status: None   Collection Time: 11/11/19  9:04 PM  Result Value Ref Range   Glucose-Capillary 90 70 - 99 mg/dL  Basic metabolic panel     Status: Abnormal   Collection Time: 11/12/19  3:58 AM  Result Value Ref Range   Sodium 143 135 - 145 mmol/L   Potassium 3.9 3.5 - 5.1 mmol/L   Chloride 113 (H) 98 - 111 mmol/L   CO2 18 (L) 22 - 32 mmol/L   Glucose, Bld 79 70 - 99 mg/dL   BUN 54 (H) 6 - 20 mg/dL   Creatinine, Ser 2.82 (H) 0.61 - 1.24 mg/dL   Calcium 7.0 (L) 8.9 - 10.3 mg/dL   GFR calc non Af Amer 24 (L) >60 mL/min   GFR calc Af Amer 28 (L) >60 mL/min   Anion gap 12 5 - 15  CBC     Status: Abnormal   Collection Time: 11/12/19  3:58 AM  Result Value Ref Range   WBC 3.9 (L) 4.0 - 10.5 K/uL   RBC 2.69 (L) 4.22 - 5.81 MIL/uL   Hemoglobin 7.9 (L) 13.0 - 17.0 g/dL   HCT 25.3 (L) 39 - 52 %   MCV 94.1 80.0 - 100.0 fL   MCH 29.4 26.0 - 34.0 pg   MCHC 31.2 30.0 - 36.0 g/dL   RDW 19.7 (H) 11.5 - 15.5 %   Platelets 97 (L) 150 - 400 K/uL   nRBC 0.0 0.0 - 0.2 %  Magnesium     Status: None   Collection Time: 11/12/19  3:58 AM  Result Value Ref Range   Magnesium 2.2 1.7 - 2.4 mg/dL  Glucose, capillary     Status: None   Collection Time: 11/12/19  7:47 AM  Result Value Ref Range   Glucose-Capillary 77 70 - 99 mg/dL  Glucose, capillary     Status: Abnormal   Collection Time: 11/12/19 11:40 AM  Result Value Ref Range   Glucose-Capillary 101 (H) 70 - 99 mg/dL   Glucose, capillary     Status: Abnormal   Collection Time: 11/12/19  4:08 PM  Result Value Ref Range   Glucose-Capillary 129 (H) 70 - 99 mg/dL   Recent Results (from the past 240 hour(s))  Culture, blood (Routine x 2)     Status: Abnormal   Collection Time: 11/07/19  6:36 PM   Specimen: BLOOD  Result Value Ref Range Status   Specimen Description   Final    BLOOD RIGHT ANTECUBITAL Performed at [facility], [place] [address] [place], [place] [address]    Special Requests   Final    BOTTLES DRAWN AEROBIC AND ANAEROBIC Blood Culture adequate volume Performed at [facility] [address], [place], [place] [address]    Culture  Setup Time   Final    GRAM NEGATIVE RODS IN BOTH AEROBIC AND ANAEROBIC BOTTLES CRITICAL RESULT CALLED TO, READ BACK BY AND VERIFIED WITH: Guadlupe Spanish PharmD 10:00 11/08/19 ([email or web address]) Performed at [facility], [address] [place], [place] [address]    Culture ESCHERICHIA COLI PROTEUS MIRABILIS  (A)  Final   Report Status 11/10/2019 FINAL  Final   Organism ID, Bacteria ESCHERICHIA COLI  Final   Organism ID, Bacteria PROTEUS MIRABILIS  Final      Susceptibility   Escherichia coli - MIC*    AMPICILLIN <=2 SENSITIVE Sensitive     CEFAZOLIN <=4 SENSITIVE Sensitive     CEFEPIME <=0.12 SENSITIVE Sensitive     CEFTAZIDIME <=1 SENSITIVE Sensitive  CEFTRIAXONE <=0.25 SENSITIVE Sensitive     CIPROFLOXACIN <=0.25 SENSITIVE Sensitive     GENTAMICIN <=1 SENSITIVE Sensitive     IMIPENEM <=0.25 SENSITIVE Sensitive     TRIMETH/SULFA <=20 SENSITIVE Sensitive     AMPICILLIN/SULBACTAM <=2 SENSITIVE Sensitive     PIP/TAZO <=4 SENSITIVE Sensitive     * ESCHERICHIA COLI   Proteus mirabilis - MIC*    AMPICILLIN <=2 SENSITIVE Sensitive     CEFAZOLIN <=4 SENSITIVE Sensitive     CEFEPIME <=0.12 SENSITIVE Sensitive     CEFTAZIDIME <=1 SENSITIVE Sensitive     CEFTRIAXONE <=0.25 SENSITIVE Sensitive     CIPROFLOXACIN <=0.25  SENSITIVE Sensitive     GENTAMICIN <=1 SENSITIVE Sensitive     IMIPENEM 2 SENSITIVE Sensitive     TRIMETH/SULFA <=20 SENSITIVE Sensitive     AMPICILLIN/SULBACTAM <=2 SENSITIVE Sensitive     PIP/TAZO <=4 SENSITIVE Sensitive     * PROTEUS MIRABILIS  Blood Culture ID Panel (Reflexed)     Status: Abnormal   Collection Time: 11/07/19  6:36 PM  Result Value Ref Range Status   Enterococcus faecalis NOT DETECTED NOT DETECTED Final   Enterococcus Faecium NOT DETECTED NOT DETECTED Final   Listeria monocytogenes NOT DETECTED NOT DETECTED Final   Staphylococcus species NOT DETECTED NOT DETECTED Final   Staphylococcus aureus (BCID) NOT DETECTED NOT DETECTED Final   Staphylococcus epidermidis NOT DETECTED NOT DETECTED Final   Staphylococcus lugdunensis NOT DETECTED NOT DETECTED Final   Streptococcus species NOT DETECTED NOT DETECTED Final   Streptococcus agalactiae NOT DETECTED NOT DETECTED Final   Streptococcus pneumoniae NOT DETECTED NOT DETECTED Final   Streptococcus pyogenes NOT DETECTED NOT DETECTED Final   A.calcoaceticus-baumannii NOT DETECTED NOT DETECTED Final   Bacteroides fragilis NOT DETECTED NOT DETECTED Final   Enterobacterales DETECTED (A) NOT DETECTED Final    Comment: CRITICAL RESULT CALLED TO, READ BACK BY AND VERIFIED WITH: Guadlupe Spanish PharmD 10:00 11/08/19 ([email or web address])    Enterobacter cloacae complex NOT DETECTED NOT DETECTED Final   Escherichia coli DETECTED (A) NOT DETECTED Final    Comment: CRITICAL RESULT CALLED TO, READ BACK BY AND VERIFIED WITH: Guadlupe Spanish PharmD 10:00 11/08/19 ([email or web address])    Klebsiella aerogenes NOT DETECTED NOT DETECTED Final   Klebsiella oxytoca NOT DETECTED NOT DETECTED Final   Klebsiella pneumoniae NOT DETECTED NOT DETECTED Final   Proteus species DETECTED (A) NOT DETECTED Final    Comment: CRITICAL RESULT CALLED TO, READ BACK BY AND VERIFIED WITH: Guadlupe Spanish PharmD 10:00 11/08/19 ([email or web address])    Salmonella species NOT DETECTED NOT DETECTED Final    Serratia marcescens NOT DETECTED NOT DETECTED Final   Haemophilus influenzae NOT DETECTED NOT DETECTED Final   Neisseria meningitidis NOT DETECTED NOT DETECTED Final   Pseudomonas aeruginosa NOT DETECTED NOT DETECTED Final   Stenotrophomonas maltophilia NOT DETECTED NOT DETECTED Final   Candida albicans NOT DETECTED NOT DETECTED Final   Candida auris NOT DETECTED NOT DETECTED Final   Candida glabrata NOT DETECTED NOT DETECTED Final   Candida krusei NOT DETECTED NOT DETECTED Final   Candida parapsilosis NOT DETECTED NOT DETECTED Final   Candida tropicalis NOT DETECTED NOT DETECTED Final   Cryptococcus neoformans/gattii NOT DETECTED NOT DETECTED Final   CTX-M ESBL NOT DETECTED NOT DETECTED Final   Carbapenem resistance IMP NOT DETECTED NOT DETECTED Final   Carbapenem resistance KPC NOT DETECTED NOT DETECTED Final   Carbapenem resistance NDM NOT DETECTED NOT DETECTED Final   Carbapenem resist OXA 48 LIKE NOT DETECTED NOT DETECTED Final  Carbapenem resistance VIM NOT DETECTED NOT DETECTED Final    Comment: Performed at [facility], [place] [address], [place], [place] [address]  Culture, blood (Routine x 2)     Status: Abnormal   Collection Time: 11/07/19  6:41 PM   Specimen: BLOOD  Result Value Ref Range Status   Specimen Description   Final    BLOOD LEFT ANTECUBITAL Performed at [facility] [address] [place], [place] [address]    Special Requests   Final    BOTTLES DRAWN AEROBIC AND ANAEROBIC Blood Culture adequate volume Performed at [facility] [address] [place], [place] [address]    Culture  Setup Time   Final    GRAM NEGATIVE RODS IN BOTH AEROBIC AND ANAEROBIC BOTTLES CRITICAL VALUE NOTED.  VALUE IS CONSISTENT WITH PREVIOUSLY REPORTED AND CALLED VALUE.    Culture (A)  Final    ESCHERICHIA COLI SUSCEPTIBILITIES PERFORMED ON PREVIOUS CULTURE WITHIN THE LAST 5 DAYS. Performed at [facility], [place] [address],  [place], [place] [address]    Report Status 11/10/2019 FINAL  Final  Urine culture     Status: Abnormal   Collection Time: 11/07/19  6:45 PM   Specimen: Urine, Random  Result Value Ref Range Status   Specimen Description   Final    URINE, RANDOM Performed at [facility] [address], [place], [place] [address]    Special Requests   Final    NONE Performed at [facility], [place] [address], [place], [place] [address]    Culture (A)  Final    70,000 COLONIES/mL ESCHERICHIA COLI >=100,000 COLONIES/mL PSEUDOMONAS AERUGINOSA    Report Status 11/10/2019 FINAL  Final   Organism ID, Bacteria ESCHERICHIA COLI (A)  Final   Organism ID, Bacteria PSEUDOMONAS AERUGINOSA (A)  Final      Susceptibility   Escherichia coli - MIC*    AMPICILLIN <=2 SENSITIVE Sensitive     CEFAZOLIN <=4 SENSITIVE Sensitive     CEFTRIAXONE <=0.25 SENSITIVE Sensitive     CIPROFLOXACIN <=0.25 SENSITIVE Sensitive     GENTAMICIN <=1 SENSITIVE Sensitive     IMIPENEM <=0.25 SENSITIVE Sensitive     NITROFURANTOIN <=16 SENSITIVE Sensitive     TRIMETH/SULFA <=20 SENSITIVE Sensitive     AMPICILLIN/SULBACTAM <=2 SENSITIVE Sensitive     PIP/TAZO <=4 SENSITIVE Sensitive     * 70,000 COLONIES/mL ESCHERICHIA COLI   Pseudomonas aeruginosa - MIC*    CEFTAZIDIME 4 SENSITIVE Sensitive     CIPROFLOXACIN 0.5 SENSITIVE Sensitive     GENTAMICIN <=1 SENSITIVE Sensitive     IMIPENEM 1 SENSITIVE Sensitive     PIP/TAZO 8 SENSITIVE Sensitive     CEFEPIME 2 SENSITIVE Sensitive     * >=100,000 COLONIES/mL PSEUDOMONAS AERUGINOSA  SARS Coronavirus 2 by RT PCR (hospital order, performed in [facility] hospital lab) Nasopharyngeal Nasopharyngeal Swab     Status: None   Collection Time: 11/07/19  7:08 PM   Specimen: Nasopharyngeal Swab  Result Value Ref Range Status   SARS Coronavirus 2 NEGATIVE NEGATIVE Final    Comment: (NOTE) SARS-CoV-2 target nucleic acids are NOT DETECTED.  The SARS-CoV-2 RNA is  generally detectable in upper and lower respiratory specimens during the acute phase of infection. The lowest concentration of SARS-CoV-2 viral copies this assay can detect is 250 copies / mL. A negative result does not preclude SARS-CoV-2 infection and should not be used as the sole basis for treatment or  other patient management decisions.  A negative result may occur with improper specimen collection / handling, submission of specimen other than nasopharyngeal swab, presence of viral mutation(s) within the areas targeted by this assay, and inadequate number of viral copies (<250 copies / mL). A negative result must be combined with clinical observations, patient history, and epidemiological information.  Fact Sheet for Patients:   StrictlyIdeas.no  Fact Sheet for Healthcare Providers: BankingDealers.co.za  This test is not yet approved or  cleared by the [place] FDA and has been authorized for detection and/or diagnosis of SARS-CoV-2 by FDA under an Emergency Use Authorization (EUA).  This EUA will remain in effect (meaning this test can be used) for the duration of the COVID-19 declaration under Section 564(b)(1) of the Act, 21 U.S.C. section 360bbb-3(b)(1), unless the authorization is terminated or revoked sooner.  Performed at [facility], [place] [address], [place], [place] [address]   MRSA PCR Screening     Status: None   Collection Time: 11/08/19  2:35 AM   Specimen: Nasal Mucosa; Nasopharyngeal  Result Value Ref Range Status   MRSA by PCR NEGATIVE NEGATIVE Final    Comment:        The GeneXpert MRSA Assay (FDA approved for NASAL specimens only), is one component of a comprehensive MRSA colonization surveillance program. It is not intended to diagnose MRSA infection nor to guide or monitor treatment for MRSA infections. Performed at [facility], [place] [address]  [place], [place] [address]     Renal Function: Recent Labs    11/07/19 1836 11/08/19 0329 11/08/19 1136 11/09/19 0556 11/10/19 0932 11/11/19 0821 11/12/19 0358  CREATININE 2.92* 3.06* 3.09* 3.08* 3.05* 3.02* 2.82*   Estimated Creatinine Clearance: 23.1 mL/min (A) (by C-G formula based on SCr of 2.82 mg/dL (H)).  Radiologic Imaging: US RENAL  Result Date: 11/12/2019 CLINICAL DATA:  Hydronephrosis EXAM: RENAL / URINARY TRACT ULTRASOUND COMPLETE COMPARISON:  CT 11/07/2019 FINDINGS: Right Kidney: Renal measurements: 9.3 x 5.2 x 6.3 cm = volume: 160 mL . Echogenicity within normal limits. No mass or shadowing stone visualized. Mild hydronephrosis. Left Kidney: Renal measurements: 10.9 x 6.7 x 7.5 cm = volume: 284 mL. Echogenicity within normal limits. No mass or shadowing stone visualized. Mild hydronephrosis. Bladder: There is a large heterogeneous intraluminal mass within the urinary bladder measuring approximately 9.4 x 5.0 x 5.2 cm. No internal vascularity is seen within the mass with color Doppler. Other: Small volume ascites incidentally noted. IMPRESSION: 1. Large avascular heterogeneous intraluminal mass within the urinary bladder measuring up to 9.4 cm. Findings are most compatible with a large intraluminal blood clot in the setting of hematuria. Follow-up to resolution is recommended to exclude the presence of an underlying malignancy. 2. Mild bilateral hydronephrosis. 3. Small volume ascites. Electronically Signed   By: Davina Poke D.O.   On: 11/12/2019 15:28    I independently reviewed the above imaging studies.  I did hand irrigate his bladder.  This irrigated relatively easily and remained relatively clear without obvious clots noted.  Impression/Recommendation: 1: Hematuria: Etiology is unclear.  However, considering ultrasound findings today which suggest a persistent mass within the bladder, I will talk with the primary team about possibly scheduling him to go to the OR  tomorrow for cystoscopy as he will likely either need clot evacuation for a large clot within the bladder versus possible transurethral resection of bladder tumor if present.  I can also perform bilateral retrograde pyelograms at that time to complete  his hematuria evaluation.  If he did have evidence of obstruction, we could consider stent placement.  2.  Bilateral hydronephrosis: Likely related to bladder outlet obstruction.  However, if there is persistent evidence of upper tract obstruction as noted above, I can consider ureteral stent placement to optimize his renal function.  3.  Questionable bladder mass: See above.  4.  Urinary retention/bladder outlet obstruction: Continue indwelling Foley catheter.  We will proceed with further evaluation as an outpatient following discharge.  [name] 11/12/2019, 4:42 PM  Pryor Curia. MD   CC: Dr. Antonieta Pert

## 2019-11-13 LAB — GLUCOSE, CAPILLARY
Glucose-Capillary: 127 mg/dL — ABNORMAL HIGH (ref 70–99)
Glucose-Capillary: 164 mg/dL — ABNORMAL HIGH (ref 70–99)
Glucose-Capillary: 91 mg/dL (ref 70–99)
Glucose-Capillary: 121 mg/dL — ABNORMAL HIGH (ref 70–99)

## 2019-11-13 LAB — BASIC METABOLIC PANEL
Anion gap: 8 (ref 5–15)
BUN: 50 mg/dL — ABNORMAL HIGH (ref 6–20)
CO2: 18 mmol/L — ABNORMAL LOW (ref 22–32)
Calcium: 6.8 mg/dL — ABNORMAL LOW (ref 8.9–10.3)
Chloride: 109 mmol/L (ref 98–111)
Creatinine, Ser: 2.57 mg/dL — ABNORMAL HIGH (ref 0.61–1.24)
GFR calc Af Amer: 31 mL/min — ABNORMAL LOW (ref >60)
GFR calc non Af Amer: 27 mL/min — ABNORMAL LOW (ref >60)
Glucose, Bld: 122 mg/dL — ABNORMAL HIGH (ref 70–99)
Potassium: 3.4 mmol/L — ABNORMAL LOW (ref 3.5–5.1)
Sodium: 135 mmol/L (ref 135–145)

## 2019-11-13 LAB — CBC
HCT: 24.7 % — ABNORMAL LOW (ref 39.0–52.0)
Hemoglobin: 7.6 g/dL — ABNORMAL LOW (ref 13.0–17.0)
MCH: 29.6 pg (ref 26.0–34.0)
MCHC: 30.8 g/dL (ref 30.0–36.0)
MCV: 96.1 fL (ref 80.0–100.0)
Platelets: 96 10*3/uL — ABNORMAL LOW (ref 150–400)
RBC: 2.57 MIL/uL — ABNORMAL LOW (ref 4.22–5.81)
RDW: 19.7 % — ABNORMAL HIGH (ref 11.5–15.5)
WBC: 6.4 10*3/uL (ref 4.0–10.5)
nRBC: 0 % (ref 0.0–0.2)

## 2019-11-13 LAB — MAGNESIUM: Magnesium: 2.1 mg/dL (ref 1.7–2.4)

## 2019-11-13 SURGERY — CYSTOSCOPY, WITH RETROGRADE PYELOGRAM AND URETERAL STENT INSERTION
Anesthesia: General

## 2019-11-13 NOTE — Progress Notes (Signed)
Patient ID: Chase [name], male   DOB: 28-Apr-1965, 54 y.o.   MRN: [id number]    Subjective: Pt without new complaints.  Catheter has been draining well overnight.  Objective: Vital signs in last 24 hours: Temp:  [98.2 F (36.8 C)-98.3 F (36.8 C)] 98.2 F (36.8 C) (08/14 0326) Pulse Rate:  [65-77] 65 (08/14 0326) Resp:  [15-18] 15 (08/14 0326) BP: (103-117)/(68-74) 103/73 (08/14 0326) SpO2:  [98 %-100 %] 100 % (08/14 0326)  Intake/Output from previous day: 08/13 0701 - 08/14 0700 In: 1180 [P.O.:1180] Out: 3525 [Urine:3525] Intake/Output this shift: Total I/O In: -  Out: 650 [Urine:650]  Physical Exam:  General: Alert and oriented GU: Urine tea colored to dark red  Lab Results: Recent Labs    11/11/19 0329 11/12/19 0358 11/13/19 0322  HGB 7.3* 7.9* 7.6*  HCT 23.3* 25.3* 24.7*   BMET Recent Labs    11/12/19 0358 11/13/19 0322  NA 143 135  K 3.9 3.4*  CL 113* 109  CO2 18* 18*  GLUCOSE 79 122*  BUN 54* 50*  CREATININE 2.82* 2.57*  CALCIUM 7.0* 6.8*     Studies/Results: US RENAL  Result Date: 11/12/2019 CLINICAL DATA:  Hydronephrosis EXAM: RENAL / URINARY TRACT ULTRASOUND COMPLETE COMPARISON:  CT 11/07/2019 FINDINGS: Right Kidney: Renal measurements: 9.3 x 5.2 x 6.3 cm = volume: 160 mL . Echogenicity within normal limits. No mass or shadowing stone visualized. Mild hydronephrosis. Left Kidney: Renal measurements: 10.9 x 6.7 x 7.5 cm = volume: 284 mL. Echogenicity within normal limits. No mass or shadowing stone visualized. Mild hydronephrosis. Bladder: There is a large heterogeneous intraluminal mass within the urinary bladder measuring approximately 9.4 x 5.0 x 5.2 cm. No internal vascularity is seen within the mass with color Doppler. Other: Small volume ascites incidentally noted. IMPRESSION: 1. Large avascular heterogeneous intraluminal mass within the urinary bladder measuring up to 9.4 cm. Findings are most compatible with a large intraluminal blood clot  in the setting of hematuria. Follow-up to resolution is recommended to exclude the presence of an underlying malignancy. 2. Mild bilateral hydronephrosis. 3. Small volume ascites. Electronically Signed   By: Davina Poke D.O.   On: 11/12/2019 15:28    Assessment/Plan: 1) Bladder mass/clot: Renal ultrasound reviewed yesterday.  Very large clot or mass in bladder.  I spoke with Dr. Lupita Leash last evening and we agreed to plan for cystoscopy with clot evacuation vs TURBT on Sunday to allow an additional day for him to recover from recent bactermia/sepsis. NPO after MN. 2) Bilateral hydronephrosis: Seemingly improved but still present on ultrasound.  Will assess with retrograde pyelography tomorrow and possible stent placement if indicated. 3) CKD: Improving and now around reported baseline. 4) Urinary retention: Presumably due to bladder outlet obstruction but will leave catheter for now and consider voiding trial in future.  Continue tamsulosin 0.4 mg.   LOS: 5 days   Chase [name] 11/13/2019, 11:39 AM

## 2019-11-13 NOTE — Progress Notes (Signed)
PROGRESS NOTE    Chase [name]  VHQ:[id number] DOB: [birth year] DOA: 11/07/2019 PCP: [name]   Chief Complaint  Patient presents with  . 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  Initially found to be in septic shock from urosepsis started on pressors, urine cultures grew Pseudomonas and E. coli.  Initially on broad-spectrum antibiotics then transition to cefepime. He  Reports he had hematuria from traumatic pulling of foley when it got caught during bed transfer. But he reports he has had hematuria before. 8/13 seen by urology and renal ultrasound shows "Large avascular heterogeneous intraluminal mass within the urinary bladder measuring up to 9.4 cm. Findings are most compatible with a large intraluminal blood clot in the setting of hematuria. Follow-up to resolution is recommended to exclude the presence of an underlying malignancy."  Subjective:  No acute events overnight.  Hemoglobin is slightly downtrending 7.6 g, platelet 96,000, creatinine further improving 2.5  Urine output 3525 mL still pinkish Having his breakfast He denies any nausea, vomiting, chest pain, shortness of breath, fever, chills, headache, focal weakness, numbness tingling, speech difficulties Assessment & Plan:  Septic shock UTI, Urine culture with E coli and pseudomonas and Blood culture with E coli and proteus:Shock physiology resolved.  On midodrine.  On Cefepime and plan is to switch to ciprofloxacin on discharge to complete course with last 8/17.Has had Foley for few weeks continue the indwelling Foley catheter in the setting of hematuria.  Overall hemodynamically stable afebrile.  AKI on CKD stage IIIa chronic urine retention secondary  to BPH.  Baseline creatinine 2.3 peaked to 3.0, now nicely improving at 2.5.Continue Foley. Continue on Ringer's lactate infusion gently. Recent Labs  Lab 11/09/19 0556 11/10/19 0932 11/11/19 0821 11/12/19 0358 11/13/19 0322  BUN 57* 62* 62* 54* 50*  CREATININE 3.08* 3.05* 3.02* 2.82* 2.57*   Persistent hematuria/acute blood loss anemia:/chronic urinary retention with Foley in place.  Followed by [facility] urology.  Off subcu heparin.  Still having hematuria with pinkish urine .  Renal ultrasound shows heterogenous intraluminal mass within the urinary bladder up to 9 cm blood clot versus malignancy, and bilateral hydronephrosis.  Urology planning for cystoscopy evacuation versus transurethral resection of bladder tumor tomorrow.Hemoglobin dropped as low as 6.2 needing 1 unit PRBC. Recent Labs  Lab 11/08/19 0329 11/10/19 0932 11/11/19 0329 11/12/19 0358 11/13/19 0322  HGB 7.6* 6.2* 7.3* 7.9* 7.6*   Recent CVA in July 2021 on aspirin, continue PT OT.  Chronic pain on home Norco, Cymbalta and Neurontin  Adult failure to thrive with history of gastric bypass, sacral stage II pressure ulcer no signs of infection.  Continue nutritional support augment, encourage ambulation PT OT.  GERD continue PPI..  DVT prophylaxis: Place and maintain sequential compression device Start: 11/10/19 1028 Code Status:   Code Status: Full Code  Family Communication: plan of care discussed with patient at bedside.  Status is: Inpatient  Remains inpatient appropriate because:IV treatments appropriate due to intensity of illness or inability to take PO and Inpatient level of care appropriate due to severity of illness   Dispo: The patient is from: SNF              Anticipated d/c is to: SNF  Anticipated d/c date is: 2 days              Patient currently is not medically stable to d/c.  Needing further evaluation of the bladder mass.    Nutrition: Diet Order            Diet regular Room service  appropriate? Yes; Fluid consistency: Thin  Diet effective now                 Nutrition Problem: Severe Malnutrition Etiology: chronic illness Signs/Symptoms: severe fat depletion, severe muscle depletion Interventions: Other (Comment) [employer]) Body mass index is 18.5 kg/m.  Consultants:see note  Procedures:see note Microbiology:see note Blood Culture    Component Value Date/Time   SDES  11/07/2019 1845    URINE, RANDOM Performed at [facility], [place] [address] [place], [place] [address]    SPECREQUEST  11/07/2019 1845    NONE Performed at [facility], [place] [address], [place], [place] [address]    CULT (A) 11/07/2019 1845    70,000 COLONIES/mL ESCHERICHIA COLI >=100,000 COLONIES/mL PSEUDOMONAS AERUGINOSA    REPTSTATUS 11/10/2019 FINAL 11/07/2019 1845    Other culture-see note  Medications: Scheduled Meds: . Chlorhexidine Gluconate Cloth  6 each Topical Daily  . DULoxetine  60 mg Oral Daily  . feeding supplement (KATE FARMS STANDARD 1.4)  325 mL Oral BID BM  . ferrous sulfate  325 mg Oral Q breakfast  . gabapentin  300 mg Oral BID  . insulin aspart  0-9 Units Subcutaneous TID WC  . midodrine  5 mg Oral TID AC  . multivitamin with minerals  1 tablet Oral Daily  . pantoprazole  40 mg Oral BID  . tamsulosin  0.4 mg Oral Daily   Continuous Infusions: . ceFEPime (MAXIPIME) IV 2 g (11/13/19 0828)  . lactated ringers 75 mL/hr at 11/13/19 0130    Antimicrobials: Anti-infectives (From admission, onward)   Start     Dose/Rate Route Frequency Ordered Stop   11/10/19 0830  ceFEPIme (MAXIPIME) 2 g in sodium chloride 0.9 % 100 mL IVPB     Discontinue     2 g 200 mL/hr over 30 Minutes Intravenous Every 24 hours 11/10/19 0815     11/08/19 2000  ceFEPIme (MAXIPIME) 2 g in sodium chloride 0.9 % 100 mL IVPB  Status:  Discontinued        2 g 200 mL/hr over 30 Minutes Intravenous Every 24 hours 11/08/19 0025 11/08/19 1037   11/08/19  2000  vancomycin (VANCOREADY) IVPB 500 mg/100 mL  Status:  Discontinued        500 mg 100 mL/hr over 60 Minutes Intravenous Every 24 hours 11/08/19 0027 11/08/19 1037   11/08/19 1800  cefTRIAXone (ROCEPHIN) 2 g in sodium chloride 0.9 % 100 mL IVPB  Status:  Discontinued        2 g 200 mL/hr over 30 Minutes Intravenous Every 24 hours 11/08/19 1037 11/10/19 0815   11/08/19 0300  ceFEPIme (MAXIPIME) 1 g in sodium chloride 0.9 % 100 mL IVPB  Status:  Discontinued        1 g 200 mL/hr over 30 Minutes Intravenous Every 12 hours 11/08/19 0021 11/08/19 0025   11/07/19 1930  vancomycin (VANCOREADY) IVPB 1250 mg/250 mL        1,250 mg 166.7 mL/hr over 90 Minutes Intravenous  Once 11/07/19 1858 11/07/19 2154   11/07/19 1900  ceFEPIme (MAXIPIME) 2 g in sodium chloride 0.9 % 100 mL IVPB  2 g 200 mL/hr over 30 Minutes Intravenous  Once 11/07/19 1854 11/07/19 2009   11/07/19 1900  metroNIDAZOLE (FLAGYL) IVPB 500 mg        500 mg 100 mL/hr over 60 Minutes Intravenous  Once 11/07/19 1854 11/07/19 2325   11/07/19 1900  vancomycin (VANCOCIN) IVPB 1000 mg/200 mL premix  Status:  Discontinued        1,000 mg 200 mL/hr over 60 Minutes Intravenous  Once 11/07/19 1854 11/07/19 1858       Objective: Vitals: Today's Vitals   11/13/19 0325 11/13/19 0326 11/13/19 0423 11/13/19 0700  BP:  103/73    Pulse:  65    Resp:  15    Temp:  98.2 F (36.8 C)    TempSrc:  Oral    SpO2:  100%    Weight:      Height:      PainSc: 9   7  9      Intake/Output Summary (Last 24 hours) at 11/13/2019 0853 Last data filed at 11/13/2019 0658 Gross per 24 hour  Intake 1180 ml  Output 3525 ml  Net -2345 ml   Filed Weights   11/07/19 1832 11/11/19 0500  Weight: 55 kg 55.2 kg   Weight change:    Intake/Output from previous day: 08/13 0701 - 08/14 0700 In: 1180 [P.O.:1180] Out: 0263 [Urine:3525] Intake/Output this shift: No intake/output data recorded.  Examination:  General exam: AAO , NAD, weak  appearing. HEENT:Oral mucosa moist, Ear/Nose WNL grossly, dentition normal. Respiratory system: bilaterally clear,no wheezing or crackles,no use of accessory muscle Cardiovascular system: S1 & S2 +, No JVD,. Gastrointestinal system: Abdomen soft, NT,ND, BS+ Nervous System:Alert, awake, moving UE and RLE, weak LLE and able to move LUE at wrist. Extremities: No edema, distal peripheral pulses palpable.  Skin: No rashes,no icterus. MSK: Normal muscle bulk,tone, power Foley +  Data Reviewed: I have personally reviewed following labs and imaging studies CBC: Recent Labs  Lab 11/07/19 1836 11/07/19 1836 11/08/19 0329 11/10/19 0932 11/11/19 0329 11/12/19 0358 11/13/19 0322  WBC 3.3*   < > 27.6* 9.9 8.7 3.9* 6.4  NEUTROABS 3.1  --  25.9*  --   --   --   --   HGB 9.8*   < > 7.6* 6.2* 7.3* 7.9* 7.6*  HCT 33.1*   < > 25.0* 20.5* 23.3* 25.3* 24.7*  MCV 98.5   < > 96.9 94.9 93.6 94.1 96.1  PLT 178   < > 163 113* 114* 97* 96*   < > = values in this interval not displayed.   Basic Metabolic Panel: Recent Labs  Lab 11/08/19 0329 11/08/19 1136 11/09/19 0556 11/10/19 0932 11/11/19 0821 11/12/19 0358 11/13/19 0322  NA 128*   < > 139 137 139 143 135  K 3.8   < > 3.6 4.0 3.6 3.9 3.4*  CL 101   < > 112* 109 109 113* 109  CO2 13*   < > 18* 19* 21* 18* 18*  GLUCOSE 149*   < > 192* 127* 110* 79 122*  BUN 45*   < > 57* 62* 62* 54* 50*  CREATININE 3.06*   < > 3.08* 3.05* 3.02* 2.82* 2.57*  CALCIUM 7.0*   < > 7.6* 7.5* 7.4* 7.0* 6.8*  MG 1.3*  --   --   --  2.4 2.2 2.1  PHOS 4.4  --   --   --   --   --   --    < > =  values in this interval not displayed.   GFR: Estimated Creatinine Clearance: 25.4 mL/min (A) (by C-G formula based on SCr of 2.57 mg/dL (H)). Liver Function Tests: Recent Labs  Lab 11/07/19 1836 11/10/19 0932  AST 36 17  ALT 53* 41  ALKPHOS 180* 111  BILITOT 0.6 0.4  PROT 7.5 5.6*  ALBUMIN 2.7* 1.8*   No results for input(s): LIPASE, AMYLASE in the last 168  hours. No results for input(s): AMMONIA in the last 168 hours. Coagulation Profile: Recent Labs  Lab 11/07/19 1836 11/10/19 1052  INR 1.2 1.3*   Cardiac Enzymes: No results for input(s): CKTOTAL, CKMB, CKMBINDEX, TROPONINI in the last 168 hours. BNP (last 3 results) No results for input(s): PROBNP in the last 8760 hours. HbA1C: No results for input(s): HGBA1C in the last 72 hours. CBG: Recent Labs  Lab 11/12/19 0747 11/12/19 1140 11/12/19 1608 11/12/19 2145 11/13/19 0750  GLUCAP 77 101* 129* 79 127*   Lipid Profile: No results for input(s): CHOL, HDL, LDLCALC, TRIG, CHOLHDL, LDLDIRECT in the last 72 hours. Thyroid Function Tests: No results for input(s): TSH, T4TOTAL, FREET4, T3FREE, THYROIDAB in the last 72 hours. Anemia Panel: No results for input(s): VITAMINB12, FOLATE, FERRITIN, TIBC, IRON, RETICCTPCT in the last 72 hours. Sepsis Labs: Recent Labs  Lab 11/07/19 1836 11/07/19 2036 11/08/19 0329 11/08/19 0724  LATICACIDVEN 4.0* 4.1* 4.9* 4.4*    Recent Results (from the past 240 hour(s))  Culture, blood (Routine x 2)     Status: Abnormal   Collection Time: 11/07/19  6:36 PM   Specimen: BLOOD  Result Value Ref Range Status   Specimen Description   Final    BLOOD RIGHT ANTECUBITAL Performed at [facility], [place] [address] [place], [place] [address]    Special Requests   Final    BOTTLES DRAWN AEROBIC AND ANAEROBIC Blood Culture adequate volume Performed at [place] [address] [place], [place] [address]    Culture  Setup Time   Final    GRAM NEGATIVE RODS IN BOTH AEROBIC AND ANAEROBIC BOTTLES CRITICAL RESULT CALLED TO, READ BACK BY AND VERIFIED WITH: Guadlupe Spanish PharmD 10:00 11/08/19 ([email or web address]) Performed at [facility], [address], [place], [place] [address]    Culture ESCHERICHIA COLI PROTEUS MIRABILIS  (A)  Final   Report Status 11/10/2019 FINAL  Final   Organism ID, Bacteria ESCHERICHIA COLI   Final   Organism ID, Bacteria PROTEUS MIRABILIS  Final      Susceptibility   Escherichia coli - MIC*    AMPICILLIN <=2 SENSITIVE Sensitive     CEFAZOLIN <=4 SENSITIVE Sensitive     CEFEPIME <=0.12 SENSITIVE Sensitive     CEFTAZIDIME <=1 SENSITIVE Sensitive     CEFTRIAXONE <=0.25 SENSITIVE Sensitive     CIPROFLOXACIN <=0.25 SENSITIVE Sensitive     GENTAMICIN <=1 SENSITIVE Sensitive     IMIPENEM <=0.25 SENSITIVE Sensitive     TRIMETH/SULFA <=20 SENSITIVE Sensitive     AMPICILLIN/SULBACTAM <=2 SENSITIVE Sensitive     PIP/TAZO <=4 SENSITIVE Sensitive     * ESCHERICHIA COLI   Proteus mirabilis - MIC*    AMPICILLIN <=2 SENSITIVE Sensitive     CEFAZOLIN <=4 SENSITIVE Sensitive     CEFEPIME <=0.12 SENSITIVE Sensitive     CEFTAZIDIME <=1 SENSITIVE Sensitive     CEFTRIAXONE <=0.25 SENSITIVE Sensitive     CIPROFLOXACIN <=0.25 SENSITIVE Sensitive     GENTAMICIN <=1 SENSITIVE Sensitive     IMIPENEM 2 SENSITIVE Sensitive  TRIMETH/SULFA <=20 SENSITIVE Sensitive     AMPICILLIN/SULBACTAM <=2 SENSITIVE Sensitive     PIP/TAZO <=4 SENSITIVE Sensitive     * PROTEUS MIRABILIS  Blood Culture ID Panel (Reflexed)     Status: Abnormal   Collection Time: 11/07/19  6:36 PM  Result Value Ref Range Status   Enterococcus faecalis NOT DETECTED NOT DETECTED Final   Enterococcus Faecium NOT DETECTED NOT DETECTED Final   Listeria monocytogenes NOT DETECTED NOT DETECTED Final   Staphylococcus species NOT DETECTED NOT DETECTED Final   Staphylococcus aureus (BCID) NOT DETECTED NOT DETECTED Final   Staphylococcus epidermidis NOT DETECTED NOT DETECTED Final   Staphylococcus lugdunensis NOT DETECTED NOT DETECTED Final   Streptococcus species NOT DETECTED NOT DETECTED Final   Streptococcus agalactiae NOT DETECTED NOT DETECTED Final   Streptococcus pneumoniae NOT DETECTED NOT DETECTED Final   Streptococcus pyogenes NOT DETECTED NOT DETECTED Final   A.calcoaceticus-baumannii NOT DETECTED NOT DETECTED Final    Bacteroides fragilis NOT DETECTED NOT DETECTED Final   Enterobacterales DETECTED (A) NOT DETECTED Final    Comment: CRITICAL RESULT CALLED TO, READ BACK BY AND VERIFIED WITH: Guadlupe Spanish PharmD 10:00 11/08/19 ([email or web address])    Enterobacter cloacae complex NOT DETECTED NOT DETECTED Final   Escherichia coli DETECTED (A) NOT DETECTED Final    Comment: CRITICAL RESULT CALLED TO, READ BACK BY AND VERIFIED WITH: Guadlupe Spanish PharmD 10:00 11/08/19 ([email or web address])    Klebsiella aerogenes NOT DETECTED NOT DETECTED Final   Klebsiella oxytoca NOT DETECTED NOT DETECTED Final   Klebsiella pneumoniae NOT DETECTED NOT DETECTED Final   Proteus species DETECTED (A) NOT DETECTED Final    Comment: CRITICAL RESULT CALLED TO, READ BACK BY AND VERIFIED WITH: Guadlupe Spanish PharmD 10:00 11/08/19 ([email or web address])    Salmonella species NOT DETECTED NOT DETECTED Final   Serratia marcescens NOT DETECTED NOT DETECTED Final   Haemophilus influenzae NOT DETECTED NOT DETECTED Final   Neisseria meningitidis NOT DETECTED NOT DETECTED Final   Pseudomonas aeruginosa NOT DETECTED NOT DETECTED Final   Stenotrophomonas maltophilia NOT DETECTED NOT DETECTED Final   Candida albicans NOT DETECTED NOT DETECTED Final   Candida auris NOT DETECTED NOT DETECTED Final   Candida glabrata NOT DETECTED NOT DETECTED Final   Candida krusei NOT DETECTED NOT DETECTED Final   Candida parapsilosis NOT DETECTED NOT DETECTED Final   Candida tropicalis NOT DETECTED NOT DETECTED Final   Cryptococcus neoformans/gattii NOT DETECTED NOT DETECTED Final   CTX-M ESBL NOT DETECTED NOT DETECTED Final   Carbapenem resistance IMP NOT DETECTED NOT DETECTED Final   Carbapenem resistance KPC NOT DETECTED NOT DETECTED Final   Carbapenem resistance NDM NOT DETECTED NOT DETECTED Final   Carbapenem resist OXA 48 LIKE NOT DETECTED NOT DETECTED Final   Carbapenem resistance VIM NOT DETECTED NOT DETECTED Final    Comment: Performed at [facility], [place]. [address],  [place], [place] [address]  Culture, blood (Routine x 2)     Status: Abnormal   Collection Time: 11/07/19  6:41 PM   Specimen: BLOOD  Result Value Ref Range Status   Specimen Description   Final    BLOOD LEFT ANTECUBITAL Performed at [place] [address], [place], [place] [address]    Special Requests   Final    BOTTLES DRAWN AEROBIC AND ANAEROBIC Blood Culture adequate volume Performed at [place] [address] [place], [place] [address]    Culture  Setup Time   Final    GRAM NEGATIVE RODS IN BOTH  AEROBIC AND ANAEROBIC BOTTLES CRITICAL VALUE NOTED.  VALUE IS CONSISTENT WITH PREVIOUSLY REPORTED AND CALLED VALUE.    Culture (A)  Final    ESCHERICHIA COLI SUSCEPTIBILITIES PERFORMED ON PREVIOUS CULTURE WITHIN THE LAST 5 DAYS. Performed at [facility], [place] [address], [place], [place] [address]    Report Status 11/10/2019 FINAL  Final  Urine culture     Status: Abnormal   Collection Time: 11/07/19  6:45 PM   Specimen: Urine, Random  Result Value Ref Range Status   Specimen Description   Final    URINE, RANDOM Performed at [place] [address] [place], [place] [address]    Special Requests   Final    NONE Performed at [facility], [place] [address], [place],  [address]    Culture (A)  Final    70,000 COLONIES/mL ESCHERICHIA COLI >=100,000 COLONIES/mL PSEUDOMONAS AERUGINOSA    Report Status 11/10/2019 FINAL  Final   Organism ID, Bacteria ESCHERICHIA COLI (A)  Final   Organism ID, Bacteria PSEUDOMONAS AERUGINOSA (A)  Final      Susceptibility   Escherichia coli - MIC*    AMPICILLIN <=2 SENSITIVE Sensitive     CEFAZOLIN <=4 SENSITIVE Sensitive     CEFTRIAXONE <=0.25 SENSITIVE Sensitive     CIPROFLOXACIN <=0.25 SENSITIVE Sensitive     GENTAMICIN <=1 SENSITIVE Sensitive     IMIPENEM <=0.25 SENSITIVE Sensitive     NITROFURANTOIN <=16 SENSITIVE Sensitive     TRIMETH/SULFA  <=20 SENSITIVE Sensitive     AMPICILLIN/SULBACTAM <=2 SENSITIVE Sensitive     PIP/TAZO <=4 SENSITIVE Sensitive     * 70,000 COLONIES/mL ESCHERICHIA COLI   Pseudomonas aeruginosa - MIC*    CEFTAZIDIME 4 SENSITIVE Sensitive     CIPROFLOXACIN 0.5 SENSITIVE Sensitive     GENTAMICIN <=1 SENSITIVE Sensitive     IMIPENEM 1 SENSITIVE Sensitive     PIP/TAZO 8 SENSITIVE Sensitive     CEFEPIME 2 SENSITIVE Sensitive     * >=100,000 COLONIES/mL PSEUDOMONAS AERUGINOSA  SARS Coronavirus 2 by RT PCR (hospital order, performed in [place] hospital lab) Nasopharyngeal Nasopharyngeal Swab     Status: None   Collection Time: 11/07/19  7:08 PM   Specimen: Nasopharyngeal Swab  Result Value Ref Range Status   SARS Coronavirus 2 NEGATIVE NEGATIVE Final    Comment: (NOTE) SARS-CoV-2 target nucleic acids are NOT DETECTED.  The SARS-CoV-2 RNA is generally detectable in upper and lower respiratory specimens during the acute phase of infection. The lowest concentration of SARS-CoV-2 viral copies this assay can detect is 250 copies / mL. A negative result does not preclude SARS-CoV-2 infection and should not be used as the sole basis for treatment or other patient management decisions.  A negative result may occur with improper specimen collection / handling, submission of specimen other than nasopharyngeal swab, presence of viral mutation(s) within the areas targeted by this assay, and inadequate number of viral copies (<250 copies / mL). A negative result must be combined with clinical observations, patient history, and epidemiological information.  Fact Sheet for Patients:   StrictlyIdeas.no  Fact Sheet for Healthcare Providers: BankingDealers.co.za  This test is not yet approved or  cleared by the [place] FDA and has been authorized for detection and/or diagnosis of SARS-CoV-2 by FDA under an Emergency Use Authorization (EUA).  This EUA will  remain in effect (meaning this test can be used) for the duration of the COVID-19 declaration under Section 564(b)(1) of the Act,  21 U.S.C. section 360bbb-3(b)(1), unless the authorization is terminated or revoked sooner.  Performed at [facility], [place] [address], [place], [place] [address]   MRSA PCR Screening     Status: None   Collection Time: 11/08/19  2:35 AM   Specimen: Nasal Mucosa; Nasopharyngeal  Result Value Ref Range Status   MRSA by PCR NEGATIVE NEGATIVE Final    Comment:        The GeneXpert MRSA Assay (FDA approved for NASAL specimens only), is one component of a comprehensive MRSA colonization surveillance program. It is not intended to diagnose MRSA infection nor to guide or monitor treatment for MRSA infections. Performed at [facility], [place] [address] [place], [place] [address]       Radiology Studies: US RENAL  Result Date: 11/12/2019 CLINICAL DATA:  Hydronephrosis EXAM: RENAL / URINARY TRACT ULTRASOUND COMPLETE COMPARISON:  CT 11/07/2019 FINDINGS: Right Kidney: Renal measurements: 9.3 x 5.2 x 6.3 cm = volume: 160 mL . Echogenicity within normal limits. No mass or shadowing stone visualized. Mild hydronephrosis. Left Kidney: Renal measurements: 10.9 x 6.7 x 7.5 cm = volume: 284 mL. Echogenicity within normal limits. No mass or shadowing stone visualized. Mild hydronephrosis. Bladder: There is a large heterogeneous intraluminal mass within the urinary bladder measuring approximately 9.4 x 5.0 x 5.2 cm. No internal vascularity is seen within the mass with color Doppler. Other: Small volume ascites incidentally noted. IMPRESSION: 1. Large avascular heterogeneous intraluminal mass within the urinary bladder measuring up to 9.4 cm. Findings are most compatible with a large intraluminal blood clot in the setting of hematuria. Follow-up to resolution is recommended to exclude the presence of an underlying malignancy. 2. Mild  bilateral hydronephrosis. 3. Small volume ascites. Electronically Signed   By: Davina Poke D.O.   On: 11/12/2019 15:28     LOS: 5 days   Antonieta Pert, MD [facility]  11/13/2019, 8:53 AM

## 2019-11-14 ENCOUNTER — Inpatient Hospital Stay (HOSPITAL_COMMUNITY): Payer: No Typology Code available for payment source | Admitting: Certified Registered Nurse Anesthetist

## 2019-11-14 ENCOUNTER — Inpatient Hospital Stay (HOSPITAL_COMMUNITY): Payer: No Typology Code available for payment source

## 2019-11-14 ENCOUNTER — Encounter (HOSPITAL_COMMUNITY)
Admission: EM | Disposition: A | Discharge status: Skilled Nursing Facility | Payer: Self-pay | Source: Skilled Nursing Facility | Attending: Internal Medicine

## 2019-11-14 HISTORY — PX: CYSTOSCOPY W/ URETERAL STENT PLACEMENT: SHX1429

## 2019-11-14 LAB — GLUCOSE, CAPILLARY
Glucose-Capillary: 101 mg/dL — ABNORMAL HIGH (ref 70–99)
Glucose-Capillary: 212 mg/dL — ABNORMAL HIGH (ref 70–99)
Glucose-Capillary: 98 mg/dL (ref 70–99)

## 2019-11-14 LAB — BASIC METABOLIC PANEL
Anion gap: 7 (ref 5–15)
BUN: 46 mg/dL — ABNORMAL HIGH (ref 6–20)
CO2: 22 mmol/L (ref 22–32)
Calcium: 7.4 mg/dL — ABNORMAL LOW (ref 8.9–10.3)
Chloride: 114 mmol/L — ABNORMAL HIGH (ref 98–111)
Creatinine, Ser: 2.52 mg/dL — ABNORMAL HIGH (ref 0.61–1.24)
GFR calc Af Amer: 32 mL/min — ABNORMAL LOW (ref >60)
GFR calc non Af Amer: 28 mL/min — ABNORMAL LOW (ref >60)
Glucose, Bld: 90 mg/dL (ref 70–99)
Potassium: 4.4 mmol/L (ref 3.5–5.1)
Sodium: 143 mmol/L (ref 135–145)

## 2019-11-14 LAB — MAGNESIUM: Magnesium: 2 mg/dL (ref 1.7–2.4)

## 2019-11-14 LAB — CBC
HCT: 24.6 % — ABNORMAL LOW (ref 39.0–52.0)
Hemoglobin: 7.5 g/dL — ABNORMAL LOW (ref 13.0–17.0)
MCH: 29.6 pg (ref 26.0–34.0)
MCHC: 30.5 g/dL (ref 30.0–36.0)
MCV: 97.2 fL (ref 80.0–100.0)
Platelets: 116 10*3/uL — ABNORMAL LOW (ref 150–400)
RBC: 2.53 MIL/uL — ABNORMAL LOW (ref 4.22–5.81)
RDW: 19.5 % — ABNORMAL HIGH (ref 11.5–15.5)
WBC: 9.4 10*3/uL (ref 4.0–10.5)
nRBC: 0 % (ref 0.0–0.2)

## 2019-11-14 SURGERY — CYSTOSCOPY, WITH RETROGRADE PYELOGRAM AND URETERAL STENT INSERTION
Anesthesia: General | Site: Bladder

## 2019-11-14 MED ORDER — PROPOFOL 10 MG/ML IV BOLUS
INTRAVENOUS | Status: DC | PRN
  Administered 2019-11-14: 100 mg via INTRAVENOUS

## 2019-11-14 MED ORDER — LIDOCAINE 2% (20 MG/ML) 5 ML SYRINGE
INTRAMUSCULAR | Status: AC
  Filled 2019-11-14: qty 5

## 2019-11-14 MED ORDER — ACETAMINOPHEN 10 MG/ML IV SOLN
INTRAVENOUS | Status: AC
  Filled 2019-11-14: qty 100

## 2019-11-14 MED ORDER — HYDROMORPHONE HCL 1 MG/ML IJ SOLN
INTRAMUSCULAR | Status: AC
  Administered 2019-11-14: 0.5 mg via INTRAVENOUS
  Filled 2019-11-14: qty 1

## 2019-11-14 MED ORDER — DEXAMETHASONE SODIUM PHOSPHATE 10 MG/ML IJ SOLN
INTRAMUSCULAR | Status: AC
  Filled 2019-11-14: qty 1

## 2019-11-14 MED ORDER — FENTANYL CITRATE (PF) 100 MCG/2ML IJ SOLN
INTRAMUSCULAR | Status: AC
  Administered 2019-11-14: 50 ug via INTRAVENOUS
  Filled 2019-11-14: qty 4

## 2019-11-14 MED ORDER — ONDANSETRON HCL 4 MG/2ML IJ SOLN
INTRAMUSCULAR | Status: AC
  Filled 2019-11-14: qty 2

## 2019-11-14 MED ORDER — MIDAZOLAM HCL 5 MG/5ML IJ SOLN
INTRAMUSCULAR | Status: DC | PRN
  Administered 2019-11-14 (×2): 1 mg via INTRAVENOUS

## 2019-11-14 MED ORDER — FENTANYL CITRATE (PF) 100 MCG/2ML IJ SOLN
25.0000 ug | INTRAMUSCULAR | Status: DC | PRN
  Administered 2019-11-14: 50 ug via INTRAVENOUS

## 2019-11-14 MED ORDER — SODIUM CHLORIDE 0.9 % IR SOLN
Status: DC | PRN
  Administered 2019-11-14: 1000 mL

## 2019-11-14 MED ORDER — SODIUM CHLORIDE 0.9 % IR SOLN
Status: DC | PRN
  Administered 2019-11-14: 6000 mL

## 2019-11-14 MED ORDER — MIDAZOLAM HCL 2 MG/2ML IJ SOLN
INTRAMUSCULAR | Status: AC
  Filled 2019-11-14: qty 2

## 2019-11-14 MED ORDER — DEXAMETHASONE SODIUM PHOSPHATE 10 MG/ML IJ SOLN
INTRAMUSCULAR | Status: DC | PRN
  Administered 2019-11-14: 4 mg via INTRAVENOUS

## 2019-11-14 MED ORDER — STERILE WATER FOR IRRIGATION IR SOLN
Status: DC | PRN
  Administered 2019-11-14: 3000 mL

## 2019-11-14 MED ORDER — INDIGOTINDISULFONATE SODIUM 8 MG/ML IJ SOLN
INTRAMUSCULAR | Status: DC | PRN
Start: 2019-11-14 — End: 2019-11-14
  Administered 2019-11-14: 5 mL via INTRAVENOUS

## 2019-11-14 MED ORDER — FENTANYL CITRATE (PF) 100 MCG/2ML IJ SOLN
INTRAMUSCULAR | Status: AC
  Filled 2019-11-14: qty 2

## 2019-11-14 MED ORDER — PROPOFOL 10 MG/ML IV BOLUS
INTRAVENOUS | Status: AC
  Filled 2019-11-14: qty 20

## 2019-11-14 MED ORDER — LIDOCAINE 2% (20 MG/ML) 5 ML SYRINGE
INTRAMUSCULAR | Status: DC | PRN
  Administered 2019-11-14: 60 mg via INTRAVENOUS

## 2019-11-14 MED ORDER — HYDROMORPHONE HCL 1 MG/ML IJ SOLN
0.5000 mg | INTRAMUSCULAR | Status: AC | PRN
  Administered 2019-11-14: 0.5 mg via INTRAVENOUS

## 2019-11-14 MED ORDER — PHENYLEPHRINE 40 MCG/ML (10ML) SYRINGE FOR IV PUSH (FOR BLOOD PRESSURE SUPPORT)
PREFILLED_SYRINGE | INTRAVENOUS | Status: DC | PRN
  Administered 2019-11-14: 120 ug via INTRAVENOUS

## 2019-11-14 MED ORDER — FENTANYL CITRATE (PF) 100 MCG/2ML IJ SOLN
INTRAMUSCULAR | Status: DC | PRN
  Administered 2019-11-14: 50 ug via INTRAVENOUS
  Administered 2019-11-14 (×2): 25 ug via INTRAVENOUS

## 2019-11-14 MED ORDER — PROMETHAZINE HCL 25 MG/ML IJ SOLN: 6.2500 mg | INTRAMUSCULAR | Status: DC | PRN

## 2019-11-14 MED ORDER — ACETAMINOPHEN 10 MG/ML IV SOLN
INTRAVENOUS | Status: DC | PRN
  Administered 2019-11-14: 1000 mg via INTRAVENOUS

## 2019-11-14 SURGICAL SUPPLY — 22 items
BAG DRN RND TRDRP ANRFLXCHMBR (UROLOGICAL SUPPLIES) ×4
BAG URINE DRAIN 2000ML AR STRL (UROLOGICAL SUPPLIES) ×6 IMPLANT
BAG URO CATCHER STRL LF (MISCELLANEOUS) ×4 IMPLANT
CATH FOLEY 2WAY 5CC 20FR (CATHETERS) ×3 IMPLANT
CATH INTERMIT  6FR 70CM (CATHETERS) ×4 IMPLANT
CLOTH BEACON ORANGE TIMEOUT ST (SAFETY) ×4 IMPLANT
ELECT REM PT RETURN 15FT ADLT (MISCELLANEOUS) ×4 IMPLANT
GLOVE BIOGEL M STRL SZ7.5 (GLOVE) ×4 IMPLANT
GOWN STRL REUS W/TWL LRG LVL3 (GOWN DISPOSABLE) ×8 IMPLANT
GUIDEWIRE STR DUAL SENSOR (WIRE) ×4 IMPLANT
GUIDEWIRE ZIPWRE .038 STRAIGHT (WIRE) IMPLANT
KIT TURNOVER KIT A (KITS) IMPLANT
LOOP CUT BIPOLAR 24F LRG (ELECTROSURGICAL) ×3 IMPLANT
MANIFOLD NEPTUNE II (INSTRUMENTS) ×4 IMPLANT
PACK CYSTO (CUSTOM PROCEDURE TRAY) ×4 IMPLANT
PENCIL SMOKE EVACUATOR (MISCELLANEOUS) IMPLANT
STENT URET 6FRX26 CONTOUR (STENTS) ×6 IMPLANT
SYR TOOMEY IRRIG 70ML (MISCELLANEOUS) ×4
SYRINGE TOOMEY IRRIG 70ML (MISCELLANEOUS) ×1 IMPLANT
TUBING CONNECTING 10 (TUBING) ×3 IMPLANT
TUBING CONNECTING 10' (TUBING) ×1
TUBING UROLOGY SET (TUBING) ×4 IMPLANT

## 2019-11-14 NOTE — Consult Note (Signed)
Chief Complaint: Patient was seen in consultation today for right hydronephrosis/right nephrostomy tube placement.  Referring Physician(s): Raynelle Bring  Supervising Physician: Aletta Edouard  Patient Status: [facility] - In-pt  History of Present Illness: Chase [name] is a 54 y.o. male with a past medical history of CVA, GI bleed, CKD, urinary retention/BPH with chronic urinary foley catheter in place, Hodgkin's lymphoma, and PTSD. He has been admitted to [facility] since 11/07/2019 for management of sepsis secondary to UTI. In addition, was found to have bilateral hydronephrosis secondary to pelvic mass. Urology was consulted and patient was taken to OR this AM with Dr. Alinda Money- he underwent left ureteral stent placement and attempt at right ureteral stent placement (unsuccessful secondary to tortuosity of right ureter).  IR requested by Dr. Alinda Money for possible image-guided right percutaneous nephrostomy tube placement. Patient awake and alert sitting in bed eating lunch with no complaints at this time. Denies fever, chills, chest pain, dyspnea, abdominal pain, or headache.   Past Medical History:  Diagnosis Date  . Cervical radiculopathy    with left sided weakness  . Chemical exposure    service related injury  . Chronic pain   . GIB (gastrointestinal bleeding)   . PTSD (post-traumatic stress disorder)   . Renal disorder    CKD  . Urinary retention    due to bladder outlet obstruction    Past Surgical History:  Procedure Laterality Date  . GASTRIC BYPASS    . HERNIA REPAIR      Allergies: Nsaids  Medications: Prior to Admission medications   Medication Sig Start Date End Date Taking? Authorizing Provider  cyanocobalamin (,VITAMIN B-12,) 1000 MCG/ML injection Inject 1,000 mcg into the muscle every 30 (thirty) days.  12/28/13  Yes [provider]  DULoxetine (CYMBALTA) 30 MG capsule Take 30-60 mg by mouth See admin instructions. Take 2 capsules in the morning and  1 capsule in the evening 08/12/17  Yes [provider]  feeding supplement, ENSURE ENLIVE, (ENSURE ENLIVE) LIQD Take 237 mLs by mouth 3 (three) times daily between meals. 10/26/19  Yes Mikhail, [place], DO  ferrous sulfate 325 (65 FE) MG tablet Take 325 mg by mouth daily.   Yes [provider]  gabapentin (NEURONTIN) 300 MG capsule Take 1 capsule (300 mg total) by mouth 2 (two) times daily. 10/26/19  Yes Mikhail, [place], DO  HYDROcodone-acetaminophen (NORCO/VICODIN) 5-325 MG tablet Take 1 tablet by mouth every 4 (four) hours as needed (pain).   Yes [provider]  hydrOXYzine (ATARAX/VISTARIL) 10 MG tablet Take 1 tablet (10 mg total) by mouth 3 (three) times daily as needed for itching or anxiety. 10/26/19  Yes Mikhail, Maryann, DO  midodrine (PROAMATINE) 2.5 MG tablet Take 1 tablet (2.5 mg total) by mouth 3 (three) times daily. 10/26/19  Yes Mikhail, [place], DO  Multiple Vitamin (MULTIVITAMIN WITH MINERALS) TABS tablet Take 1 tablet by mouth daily. 10/27/19  Yes Mikhail, Maryann, DO  pantoprazole (PROTONIX) 40 MG tablet Take 40 mg by mouth 2 (two) times daily.  06/09/18  Yes [provider]  promethazine (PHENERGAN) 25 MG tablet Take 25 mg by mouth daily as needed for nausea or vomiting.   Yes [provider]  sodium bicarbonate 650 MG tablet Take 3 tablets (1,950 mg total) by mouth 3 (three) times daily. 10/26/19  Yes Mikhail, Velta [place], DO  tamsulosin (FLOMAX) 0.4 MG CAPS capsule Take 0.4 mg by mouth daily.    Yes [provider]  aspirin EC 81 MG EC tablet  Take 1 tablet (81 mg total) by mouth daily. Swallow whole. Patient not taking: Reported on 11/07/2019 10/27/19   Cristal Ford, DO  traMADol (ULTRAM) 50 MG tablet Take 1 tablet (50 mg total) by mouth every 6 (six) hours as needed. Patient not taking: Reported on 11/07/2019 10/26/19   Cristal Ford, DO     Family History  Problem Relation Age of Onset  . Renal cancer Father   . Heart disease  Paternal Grandfather     Social History   Socioeconomic History  . Marital status: Widowed    Spouse name: Not on file  . Number of children: Not on file  . Years of education: Not on file  . Highest education level: Not on file  Occupational History  . Not on file  Tobacco Use  . Smoking status: Never Smoker  . Smokeless tobacco: Current User  Vaping Use  . Vaping Use: Never used  Substance and Sexual Activity  . Alcohol use: Never  . Drug use: Never  . Sexual activity: Not on file  Other Topics Concern  . Not on file  Social History Narrative  . Not on file   Social Determinants of Health   Financial Resource Strain:   . Difficulty of Paying Living Expenses:   Food Insecurity:   . Worried About Charity fundraiser in the Last Year:   . Arboriculturist in the Last Year:   Transportation Needs:   . Film/video editor (Medical):   Marland Kitchen Lack of Transportation (Non-Medical):   Physical Activity:   . Days of Exercise per Week:   . Minutes of Exercise per Session:   Stress:   . Feeling of Stress :   Social Connections:   . Frequency of Communication with Friends and Family:   . Frequency of Social Gatherings with Friends and Family:   . Attends Religious Services:   . Active Member of Clubs or Organizations:   . Attends Archivist Meetings:   Marland Kitchen Marital Status:      Review of Systems: A 12 point ROS discussed and pertinent positives are indicated in the HPI above.  All other systems are negative.  Review of Systems  Constitutional: Negative for chills and fever.  Respiratory: Negative for shortness of breath and wheezing.   Cardiovascular: Negative for chest pain and palpitations.  Gastrointestinal: Negative for abdominal pain.  Neurological: Negative for headaches.  Psychiatric/Behavioral: Negative for behavioral problems and confusion.    Vital Signs: BP 123/71 (BP Location: Left Arm)   Pulse (!) 59   Temp 98.2 F (36.8 C) (Oral)   Resp 14    Ht 5\' 8"  (1.727 m)   Wt 121 lb 11.1 oz (55.2 kg)   SpO2 99%   BMI 18.50 kg/m   Physical Exam Vitals and nursing note reviewed.  Constitutional:      General: He is not in acute distress.    Appearance: Normal appearance.  Cardiovascular:     Rate and Rhythm: Normal rate and regular rhythm.     Heart sounds: Normal heart sounds. No murmur heard.   Pulmonary:     Effort: Pulmonary effort is normal. No respiratory distress.     Breath sounds: Normal breath sounds. No wheezing.  Skin:    General: Skin is warm and dry.  Neurological:     Mental Status: He is alert and oriented to person, place, and time.      MD Evaluation Airway: WNL Heart: WNL Abdomen: WNL  Chest/ Lungs: WNL ASA  Classification: 3 Mallampati/Airway Score: Two   Imaging: CT ABDOMEN PELVIS WO CONTRAST  Result Date: 11/07/2019 CLINICAL DATA:  54 year old male with sepsis. Fever of 103 today. EXAM: CT ABDOMEN AND PELVIS WITHOUT CONTRAST TECHNIQUE: Multidetector CT imaging of the abdomen and pelvis was performed following the standard protocol without IV contrast. COMPARISON:  Noncontrast chest CT today reported separately. Chest CTA 10/22/2019. FINDINGS: Lower chest: Reported separately today. Hepatobiliary: Absent gallbladder.  Negative noncontrast liver. Pancreas: Negative noncontrast pancreas. Spleen: Negative. Adrenals/Urinary Tract: Mild bilateral adrenal gland thickening. Mild to moderate bilateral hydronephrosis. Hydroureter is more apparent on the right. Punctate right lower pole nephrolithiasis. No ureteral calculus identified. The bladder is abnormal with circumferential wall thickening, and best seen on sagittal image 95 with narrow windows there seems to be either a central bladder septation, or some kind of superimposed fluid collection superimposed on the bladder dome. The Foley terminates in the more caudal area. There is more complex urine density in the more dorsal/superior component (18 Hounsfield  units). There is a surgical clip within or along the posterior bladder wall, and there are also multiple surgical clips in the space of Retzius including across midline to the right. Foley catheter associated gas is primarily located within the lower section, although a punctate focus of gas might be within the septation as seen on series 6, image 71. The ureterovesical junctions cannot be confidently delineated. Stomach/Bowel: Retained gas and stool throughout the large bowel. The sigmoid colon and both colonic flexures are at the upper limits of normal. The right colon is more decompressed. Possible normal appendix on series 2, image 70. No pericecal inflammation. No superimposed dilated small bowel. A small volume of oral contrast is present in the proximal jejunum. Evidence of chronic gastrojejunostomy, distal gastrectomy. The mid and distal duodenum remain in place. No free air or free fluid. Vascular/Lymphatic: Vascular patency is not evaluated in the absence of IV contrast. Mild Aortoiliac calcified atherosclerosis. No lymphadenopathy. Streak Reproductive: Urethral catheter in place. Chronic postoperative changes to the left inguinal region and the space of Retzius in the midline with numerous surgical clips. Other: No pelvic free fluid. Musculoskeletal: Bilateral lower rib fractures which were present last month and are reported separately today. Lumbar vertebrae appear intact. Sacrum, SI joints and coccygeal segments appear intact. Pelvis and proximal femurs are intact. No discrete sacral decubitus ulcer is evident. IMPRESSION: 1. Bilateral hydronephrosis with abnormal urinary bladder wall thickening suggesting UTI, but also there is either a thick central septation which compartmentalizes the bladder into two regions, or an equal sized complex fluid collection adherent to the bladder dome. See sagittal image 95. Follow-up Pelvis CT Cystogram (CT of the pelvis after dilute contrast is instilled into the  urinary bladder via the Foley catheter) would be valuable to determine whether the two fluid spaces communicate. 2. No other acute or inflammatory process in the non-contrast abdomen or pelvis. Gas and stool in the large bowel suggests either constipation or decreased colonic motility. Sequelae of gastrojejunostomy. Electronically Signed   By: Genevie Ann M.D.   On: 11/07/2019 23:22   CT Angio Head W or Wo Contrast  Result Date: 10/17/2019 CLINICAL DATA:  [name] and slurred speech EXAM: CT ANGIOGRAPHY HEAD AND NECK TECHNIQUE: Multidetector CT imaging of the head and neck was performed using the standard protocol during bolus administration of intravenous contrast. Multiplanar CT image reconstructions and MIPs were obtained to evaluate the vascular anatomy. Carotid stenosis measurements (when applicable) are obtained utilizing  NASCET criteria, using the distal internal carotid diameter as the denominator. CONTRAST:  31mL OMNIPAQUE IOHEXOL 350 MG/ML SOLN COMPARISON:  None. FINDINGS: CTA NECK Aortic arch: Great vessel origins are incompletely included. Visualized arch is unremarkable. Right carotid system: Patent. There is no measurable stenosis at the ICA origin. Left carotid system: Common carotid origin is not included. Patent. Trace calcified plaque at the ICA origin without measurable stenosis. Vertebral arteries: Patent. Left vertebral artery is slightly dominant. Skeleton: Evidence of prior anterior fusion at C3-C4 with plate and screw fixation and well incorporated interbody graft. Degenerative changes of the cervical spine. Other neck: No mass or adenopathy. Upper chest: Included upper lungs are clear. Review of the MIP images confirms the above findings CTA HEAD Anterior circulation: Intracranial internal carotid arteries are patent. Anterior cerebral arteries are patent. 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 Middle cerebral arteries are patent. Posterior circulation: Intracranial vertebral  arteries, basilar artery, and posterior cerebral arteries are patent. Venous sinuses: Patent as allowed by contrast bolus timing. Review of the MIP images confirms the above findings IMPRESSION: No large vessel occlusion or hemodynamically significant stenosis. Electronically Signed   By: Macy Mis M.D.   On: 10/17/2019 13:15   DG Chest 1 View  Result Date: 10/20/2019 CLINICAL DATA:  Elevated D-dimer. History of chemical exposure, GI bleeding. EXAM: CHEST  1 VIEW COMPARISON:  10/17/2019 FINDINGS: Shallow lung inflation. Heart size is normal. There are no focal consolidations or pleural effusions. IMPRESSION: Shallow inflation.  No evidence for acute  abnormality. Electronically Signed   By: Nolon Nations M.D.   On: 10/20/2019 18:44   CT HEAD WO CONTRAST  Result Date: 10/18/2019 CLINICAL DATA:  Follow-up stroke. [name] and numbness. Scattered small infarctions by MRI. EXAM: CT HEAD WITHOUT CONTRAST TECHNIQUE: Contiguous axial images were obtained from the base of the skull through the vertex without intravenous contrast. COMPARISON:  10/17/2019 FINDINGS: Brain: No acute finding affecting the brainstem or cerebellum by CT. Cerebral hemispheres show generalized atrophy, somewhat premature for age. Subtle evidence cortical infarction in the right occipital lobe. No mass effect or hemorrhage. Other small infarctions seen by MRI not visible by CT. No hemorrhage, hydrocephalus or extra-axial collection. Vascular: There is atherosclerotic calcification of the major vessels at the base of the brain. Skull: Negative Sinuses/Orbits: Clear/normal Other: None IMPRESSION: Subtle evidence of cortical infarction in the right occipital region. No mass effect or hemorrhage. None of the other acute infarctions visible by MRI are visible on CT. Generalized volume loss, somewhat premature for age. Electronically Signed   By: Nelson Chimes M.D.   On: 10/18/2019 13:24   CT Chest Wo Contrast  Result Date:  11/07/2019 CLINICAL DATA:  54 year old male with sepsis.  Fever of 103 today. EXAM: CT CHEST WITHOUT CONTRAST TECHNIQUE: Multidetector CT imaging of the chest was performed following the standard protocol without IV contrast. COMPARISON:  Portable chest earlier today. CT Abdomen and Pelvis today reported separately. Prior chest CTA 10/22/2019. FINDINGS: Cardiovascular: No cardiomegaly or pericardial effusion. Vascular patency is not evaluated in the absence of IV contrast. Mediastinum/Nodes: Negative, no lymphadenopathy is evident. Lungs/Pleura: Bilateral pleural effusions appear largely resolved since last month. Major airways are patent. Improved bilateral lower lobe ventilation. There is only mild linear atelectasis or scarring in the lateral basal segment of the left lower lobe. There is a tiny calcified granuloma in the right lower lobe. Upper Abdomen: Reported separately today. Musculoskeletal: Stable thoracic spine degeneration. Mildly displaced fractures of the posterior right 9th rib and  right 10th rib costovertebral junction are redemonstrated. The left lateral 7th through 9th rib fractures remain largely nondisplaced. No new osseous abnormality identified. IMPRESSION: 1. No acute or inflammatory process in the non-contrast chest. Bilateral pleural effusions have resolved since last month. 2. Bilateral lower rib fractures, stable from last month. 3. CT Abdomen and Pelvis today reported separately. Electronically Signed   By: Genevie Ann M.D.   On: 11/07/2019 22:55   CT ANGIO CHEST PE W OR WO CONTRAST  Result Date: 10/22/2019 CLINICAL DATA:  Masslike structure around right pulmonary artery during echocardiography, positive D-dimer EXAM: CT ANGIOGRAPHY CHEST WITH CONTRAST TECHNIQUE: Multidetector CT imaging of the chest was performed using the standard protocol during bolus administration of intravenous contrast. Multiplanar CT image reconstructions and MIPs were obtained to evaluate the vascular anatomy.  CONTRAST:  79mL OMNIPAQUE IOHEXOL 350 MG/ML SOLN COMPARISON:  10/20/2019 FINDINGS: Cardiovascular: This is a technically adequate evaluation of the pulmonary vasculature. There are no filling defects or pulmonary emboli. The pulmonary artery is normal in caliber. I do not see any abnormalities associated with the right pulmonary artery to explain the reported echocardiography findings. There is a trace pericardial effusion. Evaluation of the thoracic aorta is limited due to the timing of contrast bolus. No evidence of thoracic aortic aneurysm. Mediastinum/Nodes: No enlarged mediastinal, hilar, or axillary lymph nodes. Thyroid gland, trachea, and esophagus demonstrate no significant findings. Lungs/Pleura: There are small bilateral pleural effusions, volume estimated less than 500 cc each. Dependent lower lobe atelectasis. No airspace disease or pneumothorax. The central airways are patent. Upper Abdomen: Trace ascites is noted throughout the upper abdomen. Colonic interposition right upper quadrant. Postsurgical changes from previous gastric surgery. Musculoskeletal: The patient is cachectic. There are nondisplaced right posterolateral eighth and ninth rib fractures. There are nondisplaced left anterolateral seventh and eighth rib fractures. Reconstructed images demonstrate no additional findings. Review of the MIP images confirms the above findings. IMPRESSION: 1. No evidence of pulmonary embolus. I do not see any abnormalities associated with the right pulmonary artery to explain the reported echocardiography findings. 2. Small bilateral pleural effusions. 3. Trace ascites. 4. Nondisplaced bilateral rib fractures. Electronically Signed   By: Randa Ngo M.D.   On: 10/22/2019 00:37   CT PELVIS WO CONTRAST  Result Date: 11/14/2019 CLINICAL DATA:  Possible colovesical fistula EXAM: CT PELVIS WITHOUT CONTRAST TECHNIQUE: Multidetector CT imaging of the pelvis was performed following the standard protocol without  intravenous contrast. Additionally a subsequent images were obtained following installation of dilute contrast material into the urinary bladder via the indwelling Foley catheter. COMPARISON:  Ultrasound from 11/12/2019, CT from 11/07/2019 and recent cystoscopy films from earlier in the same day. FINDINGS: Urinary Tract: The initial images obtained through the pelvis demonstrate the Foley catheter in a relatively decompressed urinary bladder displaced anteriorly. A left ureteral stent is noted in place and there is contrast material within the right ureter related to the recent retrograde pyelogram. The bladder is significantly thickened circumferentially and a large fluid collection is noted with dependent density within which measures approximately 9.5 x 8.5 cm in greatest transverse and AP dimensions respectively. This shows considerable contrast within the posterior more solid portion with extension towards the colon as well as a considerable amount of contrast within the rectosigmoid related to the recent injection during cystoscopy. Subsequently the bladder was instilled with contrast material which distended the bladder somewhat and better delineated the walls of the bladder. Considerable trabeculation is noted. There is a rent identified in the posterior aspect  of the bladder which corresponds with that seen on cystoscopy with extension of contrast material into the large abscess identified posteriorly. The more solid portion of this fluid collection may represent some hematoma within the abscess collection. There is irregularity noted posteriorly which extends towards the colon and opacifies the colon although a definitive tract between the colon and this collection is not well appreciated on this particular exam. Bowel: The rectosigmoid demonstrates contrast within from prior cystoscopic injection. There is an area with anterior to the rectum where there is loss of the normal fat plane between the known  collection posterior to the bladder and the rectum likely the site of the fistula extending to the colon. A surgical clip is noted in this region and the fat plane is more blurred when compared with the prior CT examination from 11/07/2019. The remainder of the visualized bowel appears within normal limits. Vascular/Lymphatic: Atherosclerotic calcifications are noted without aneurysmal dilatation. Reproductive:  Prostate appears within normal limits. Other: As described above, there is a large abscess interposed between the rectum and the decompressed bladder which was seen on the prior ultrasound examination but originally thought to represent thrombus within the bladder as opposed to a posterior collection in the pelvis. Musculoskeletal: No acute bony abnormality is noted. IMPRESSION: Large air-fluid collection consistent with abscess interposed between the posterior wall of the bladder in the anterior wall of the rectum. Some solid material is noted within likely representing thrombus. 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 Left ureteral stent is noted. Critical Value/emergent results were called by telephone at the time of interpretation on 11/14/2019 at 11:37 am to Dr. Raynelle Bring , who verbally acknowledged these results. Electronically Signed   By: Inez Catalina M.D.   On: 11/14/2019 11:37   MR BRAIN WO CONTRAST  Result Date: 10/18/2019 CLINICAL DATA:  Stroke follow-up.  [name] and numbness. EXAM: MRI HEAD WITHOUT CONTRAST TECHNIQUE: Multiplanar, multiecho pulse sequences of the brain and surrounding structures were obtained without intravenous contrast. COMPARISON:  Head CT and CTA 10/17/2019 FINDINGS: Brain: There are subcentimeter acute infarcts in the cerebellum  bilaterally. There is a small acute cortical infarct in the posteromedial right occipital lobe. There are multiple subcentimeter acute infarcts in the right corpus callosum, right cingulate gyrus, and right greater than left frontal lobes. No intracranial hemorrhage, mass, midline shift, or extra-axial fluid collection is identified. The ventricles and sulci are normal. Vascular: Major intracranial vascular flow voids are preserved. Skull and upper cervical spine: No suspicious marrow lesion. Anterior fusion at C3-4. Sinuses/Orbits: Unremarkable orbits. Clear paranasal sinuses. Small right mastoid effusion. Other: None. 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 Electronically Signed   By: Logan Bores M.D.   On: 10/18/2019 07:34   NM Pulmonary Perfusion  Result Date: 10/20/2019 CLINICAL DATA:  54 year old male with concern for pulmonary embolism. Positive D-dimer. EXAM: NUCLEAR MEDICINE PERFUSION LUNG SCAN TECHNIQUE: Perfusion images were obtained in multiple projections after intravenous injection of radiopharmaceutical. Ventilation scans intentionally deferred if perfusion scan and chest x-ray adequate for interpretation during COVID 19 epidemic. RADIOPHARMACEUTICALS:  4.2 mCi Tc-24m MAA IV COMPARISON:  Chest radiograph dated 10/20/2019. FINDINGS: There is homogeneous perfusion of the lungs. No focal defect identified. IMPRESSION: Normal perfusion scan. Electronically Signed  By: Anner Crete M.D.   On: 10/20/2019 18:41   US Abdomen Complete  Result Date: 10/19/2019 CLINICAL DATA:  Thrombocytopenia.  Chronic renal disease. EXAM: ABDOMEN ULTRASOUND COMPLETE COMPARISON:  No prior. FINDINGS: Gallbladder: Not visualized. Common bile duct: Diameter: Not visualized. Liver: Left lobe not visualized. Heterogeneous parenchymal pattern where visualized. No focal hepatic mass identified. Portal vein is patent on color Doppler imaging with normal  direction of blood flow towards the liver. IVC: Not visualized. Pancreas: Visualized portion unremarkable. Spleen: Size and appearance within normal limits. Spleen measures 11.2 cm. Right Kidney: Length: 9.5 cm. Increased echogenicity consistent chronic medical renal disease. Mild right hydronephrosis. No mass noted. Left Kidney: Length: 10.4 cm. Increased echogenicity consistent chronic medical renal disease. Mild left hydronephrosis. No mass noted. Small amount of perinephric fluid. Abdominal aorta: Poorly visualized. Other findings: Ascites and bilateral pleural effusions noted. This was an extremely limited exam due to overlying bowel gas. IMPRESSION: 1. Extremely limited exam due to overlying bowel gas. Gallbladder and common bile duct not visualized. Left hepatic lobe not visualized. 2. Liver has a heterogeneous parenchymal pattern suggesting the possibility of fatty infiltration or hepatocellular disease. Spleen is normal in size. 3. Increased echogenicity both kidneys consistent chronic medical renal disease. Mild bilateral hydronephrosis. Small amount of left perinephric fluid noted. 4.  Ascites and bilateral pleural effusions noted. Electronically Signed   By: Marcello Moores  Register   On: 10/19/2019 13:43   US RENAL  Result Date: 11/12/2019 CLINICAL DATA:  Hydronephrosis EXAM: RENAL / URINARY TRACT ULTRASOUND COMPLETE COMPARISON:  CT 11/07/2019 FINDINGS: Right Kidney: Renal measurements: 9.3 x 5.2 x 6.3 cm = volume: 160 mL . Echogenicity within normal limits. No mass or shadowing stone visualized. Mild hydronephrosis. Left Kidney: Renal measurements: 10.9 x 6.7 x 7.5 cm = volume: 284 mL. Echogenicity within normal limits. No mass or shadowing stone visualized. Mild hydronephrosis. Bladder: There is a large heterogeneous intraluminal mass within the urinary bladder measuring approximately 9.4 x 5.0 x 5.2 cm. No internal vascularity is seen within the mass with color Doppler. Other: Small volume ascites  incidentally noted. IMPRESSION: 1. Large avascular heterogeneous intraluminal mass within the urinary bladder measuring up to 9.4 cm. Findings are most compatible with a large intraluminal blood clot in the setting of hematuria. Follow-up to resolution is recommended to exclude the presence of an underlying malignancy. 2. Mild bilateral hydronephrosis. 3. Small volume ascites. Electronically Signed   By: Davina Poke D.O.   On: 11/12/2019 15:28   DG CHEST PORT 1 VIEW  Result Date: 11/08/2019 CLINICAL DATA:  Sepsis EXAM: PORTABLE CHEST 1 VIEW COMPARISON:  November 07, 2019 FINDINGS: The heart size and mediastinal contours are within normal limits. Interval placement of a right-sided central venous catheter with the tip at the superior cavoatrial junction. Both lungs are clear. The visualized skeletal structures are unremarkable. IMPRESSION: No active disease. Right-sided central venous catheter with the tip at the superior cavoatrial junction. Electronically Signed   By: Prudencio Pair M.D.   On: 11/08/2019 02:09   DG Chest Portable 1 View  Result Date: 11/07/2019 CLINICAL DATA:  54 year old male with sepsis, fever of 103 today. 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 EXAM: PORTABLE CHEST 1 VIEW COMPARISON:  CTA chest 10/22/2019 and earlier. FINDINGS: Portable AP semi upright view at 1912 hours. Stable somewhat low lung volumes. No pleural effusion is evident today. Allowing for portable technique the lungs are clear. Mediastinal contours remain normal. Visualized tracheal air column is within normal limits. Negative  visible bowel gas pattern. Stable surgical clips in the right upper quadrant. No acute osseous abnormality identified. IMPRESSION: No acute cardiopulmonary abnormality. Electronically Signed   By: Genevie Ann M.D.   On: 11/07/2019 19:23   DG Chest Portable 1 View  Result Date: 10/17/2019 CLINICAL DATA:  Weakness, Reports went to bed last night today awakened and fell to the left  sitting up - then fell out of bed In ED makes sffort to raise L arm, but cannot Cannot raise either R or L leg EXAM: PORTABLE CHEST 1 VIEW COMPARISON:  None. FINDINGS: The heart size and mediastinal contours are within normal limits. Low lung volumes. The lungs are clear. No pneumothorax or significant pleural effusion. No acute finding in the visualized skeleton. Prominent distended loops of bowel in the abdomen. IMPRESSION: No acute cardiopulmonary finding. Electronically Signed   By: Audie Pinto M.D.   On: 10/17/2019 13:59   DG C-Arm 1-60 Min-No Report  Result Date: 11/14/2019 Fluoroscopy was utilized by the requesting physician.  No radiographic interpretation.   ECHOCARDIOGRAM COMPLETE  Result Date: 10/19/2019    ECHOCARDIOGRAM REPORT   Patient Name:   Chase [name] Date of Exam: 10/19/2019 Medical Rec #:  [id number]          Height:       68.0 in Accession #:    [id number]         Weight:       122.0 lb Date of Birth:  1966-01-28          BSA:          1.657 m Patient Age:    65 years           BP:           117/78 mmHg Patient Gender: M                  HR:           64 bpm. Exam Location:  Inpatient Procedure: 2D Echo, Cardiac Doppler and Color Doppler Indications:    Stroke  History:        Patient has no prior history of Echocardiogram examinations.                 Stroke. Elevated troponin.  Sonographer:    Roseanna Rainbow RDCS Referring Phys: [id number] RIPUDEEP K RAI  Sonographer Comments: Technically difficult study due to poor echo windows and no subcostal window. Difficult due to very thin habitus. Very low parasternal. No true subcostal view and could not visualize IVC. IMPRESSIONS  1. There is a mass like structure noted in the right pulmonary artery. This is seen on the suprasternal notch views. Unable to exclude pulmonary embolism. Given elevated troponin, would consider CT PE study to exclude PE.  2. Left ventricular ejection fraction, by estimation, is 60 to 65%. The left ventricle has  normal function. The left ventricle has no regional wall motion abnormalities. Left ventricular diastolic parameters are consistent with Grade I diastolic dysfunction (impaired relaxation).  3. Right ventricular systolic function is normal. The right ventricular size is normal. Tricuspid regurgitation signal is inadequate for assessing PA pressure.  4. There is a moderate pericardial effusion up to 1.5 cm over the anterior RV and LV apex. There is no RA/RV diastolic collapse. There is no respiratory variation in MV inflow. The IVC is now well visualized. There are no overt echocardiographic signs of tamponade. Moderate pericardial effusion. The pericardial effusion is anterior to the  right ventricle and surrounding the apex. There is no evidence of cardiac tamponade.  5. The mitral valve is grossly normal. Trivial mitral valve regurgitation. No evidence of mitral stenosis.  6. The aortic valve is tricuspid. Aortic valve regurgitation is not visualized. No aortic stenosis is present. FINDINGS  Left Ventricle: Left ventricular ejection fraction, by estimation, is 60 to 65%. The left ventricle has normal function. The left ventricle has no regional wall motion abnormalities. The left ventricular internal cavity size was normal in size. There is  no left ventricular hypertrophy. Left ventricular diastolic parameters are consistent with Grade I diastolic dysfunction (impaired relaxation). Right Ventricle: The right ventricular size is normal. No increase in right ventricular wall thickness. Right ventricular systolic function is normal. Tricuspid regurgitation signal is inadequate for assessing PA pressure. Left Atrium: Left atrial size was normal in size. Right Atrium: Right atrial size was normal in size. Pericardium: There is a moderate pericardial effusion up to 1.5 cm over the anterior RV and LV apex. There is no RA/RV diastolic collapse. There is no respiratory variation in MV inflow. The IVC is now well  visualized. There are no overt echocardiographic signs of tamponade. A moderately sized pericardial effusion is present. The pericardial effusion is anterior to the right ventricle and surrounding the apex. The pericardial effusion appears to contain fibrous material. There is no evidence of cardiac tamponade. Presence of pericardial fat pad. Mitral Valve: The mitral valve is grossly normal. Trivial mitral valve regurgitation. No evidence of mitral valve stenosis. Tricuspid Valve: The tricuspid valve is grossly normal. Tricuspid valve regurgitation is trivial. No evidence of tricuspid stenosis. Aortic Valve: The aortic valve is tricuspid. Aortic valve regurgitation is not visualized. No aortic stenosis is present. Pulmonic Valve: The pulmonic valve was grossly normal. Pulmonic valve regurgitation is not visualized. No evidence of pulmonic stenosis. Aorta: The aortic root is normal in size and structure. Venous: The inferior vena cava was not well visualized. IAS/Shunts: The atrial septum is grossly normal.  LEFT VENTRICLE PLAX 2D LVIDd:         4.20 cm      Diastology LVIDs:         2.80 cm      LV e' lateral:   11.10 cm/s LV PW:         1.20 cm      LV E/e' lateral: 4.2 LV IVS:        1.10 cm      LV e' medial:    6.64 cm/s LVOT diam:     2.00 cm      LV E/e' medial:  7.0 LV SV:         41 LV SV Index:   25 LVOT Area:     3.14 cm  LV Volumes (MOD) LV vol d, MOD A2C: 116.0 ml LV vol d, MOD A4C: 93.8 ml LV vol s, MOD A2C: 43.3 ml LV vol s, MOD A4C: 39.7 ml LV SV MOD A2C:     72.7 ml LV SV MOD A4C:     93.8 ml LV SV MOD BP:      65.0 ml RIGHT VENTRICLE RV S prime:     18.10 cm/s TAPSE (M-mode): 2.8 cm LEFT ATRIUM           Index       RIGHT ATRIUM           Index LA diam:      3.80 cm 2.29 cm/m  RA Area:  12.00 cm LA Vol (A2C): 32.7 ml 19.74 ml/m RA Volume:   24.80 ml  14.97 ml/m LA Vol (A4C): 24.5 ml 14.79 ml/m  AORTIC VALVE LVOT Vmax:   60.10 cm/s LVOT Vmean:  36.000 cm/s LVOT VTI:    0.132 m  AORTA Ao Root  diam: 4.10 cm MITRAL VALVE MV Area (PHT): 2.21 cm    SHUNTS MV Decel Time: 343 msec    Systemic VTI:  0.13 m MV E velocity: 46.20 cm/s  Systemic Diam: 2.00 cm MV A velocity: 45.10 cm/s MV E/A ratio:  1.02 Eleonore Chiquito MD Electronically signed by Eleonore Chiquito MD Signature Date/Time: 10/19/2019/5:35:14 PM    Final    CT HEAD CODE STROKE WO CONTRAST  Result Date: 10/17/2019 CLINICAL DATA:  Code stroke. Subacute deficit with left-sided weakness. EXAM: CT HEAD WITHOUT CONTRAST TECHNIQUE: Contiguous axial images were obtained from the base of the skull through the vertex without intravenous contrast. COMPARISON:  None. FINDINGS: Brain: No evidence of acute infarction, hemorrhage, hydrocephalus, extra-axial collection or mass lesion/mass effect. Vascular: Negative for hyperdense vessel Skull: Negative Sinuses/Orbits: Mild mucosal edema left maxillary sinus otherwise clear sinuses. Negative orbit. Other: None ASPECTS ([place] Stroke Program Early CT Score) - Ganglionic level infarction (caudate, lentiform nuclei, internal capsule, insula, M1-M3 cortex): 7 - Supraganglionic infarction (M4-M6 cortex): 3 Total score (0-10 with 10 being normal): 10 IMPRESSION: 1. No acute abnormality 2. ASPECTS is 10 3. These results were called by telephone at the time of interpretation on 10/17/2019 at 12:56 pm to provider MATTHEW TRIFAN , who verbally acknowledged these results. Electronically Signed   By: Franchot Gallo M.D.   On: 10/17/2019 12:54   CT ANGIO NECK CODE STROKE  Result Date: 10/17/2019 CLINICAL DATA:  [name] and slurred speech EXAM: CT ANGIOGRAPHY HEAD AND NECK TECHNIQUE: Multidetector CT imaging of the head and neck was performed using the standard protocol during bolus administration of intravenous contrast. Multiplanar CT image reconstructions and MIPs were obtained to evaluate the vascular anatomy. Carotid stenosis measurements (when applicable) are obtained utilizing NASCET criteria, using the distal  internal carotid diameter as the denominator. CONTRAST:  76mL OMNIPAQUE IOHEXOL 350 MG/ML SOLN COMPARISON:  None. FINDINGS: CTA NECK Aortic arch: Great vessel origins are incompletely included. Visualized arch is unremarkable. Right carotid system: Patent. There is no measurable stenosis at the ICA origin. Left carotid system: Common carotid origin is not included. Patent. Trace calcified plaque at the ICA origin without measurable stenosis. Vertebral arteries: Patent. Left vertebral artery is slightly dominant. Skeleton: Evidence of prior anterior fusion at C3-C4 with plate and screw fixation and well incorporated interbody graft. Degenerative changes of the cervical spine. Other neck: No mass or adenopathy. Upper chest: Included upper lungs are clear. Review of the MIP images confirms the above findings CTA HEAD Anterior circulation: Intracranial internal carotid arteries are patent. Anterior cerebral arteries are patent. 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 Middle cerebral arteries are patent. Posterior circulation: Intracranial vertebral arteries, basilar artery, and posterior cerebral arteries are patent. Venous sinuses: Patent as allowed by contrast bolus timing. Review of the MIP images confirms the above findings IMPRESSION: No large vessel occlusion or hemodynamically significant stenosis. Electronically Signed   By: Macy Mis M.D.   On: 10/17/2019 13:15    Labs:  CBC: Recent Labs    11/11/19 0329 11/12/19 0358 11/13/19 0322 11/14/19 0333  WBC 8.7 3.9* 6.4 9.4  HGB 7.3* 7.9* 7.6* 7.5*  HCT 23.3* 25.3* 24.7* 24.6*  PLT 114* 97* 96* 116*  COAGS: Recent Labs    10/17/19 1241 10/17/19 1311 11/07/19 1836 11/07/19 1853 11/10/19 1052  INR 1.5* 1.6* 1.2  --  1.3*  APTT 43* 46*  --  31  --     BMP: Recent Labs    11/11/19 0821 11/12/19 0358 11/13/19 0322 11/14/19 0333  NA 139 143 135 143  K 3.6 3.9 3.4* 4.4  CL 109 113* 109 114*  CO2 21* 18* 18* 22  GLUCOSE 110* 79  122* 90  BUN 62* 54* 50* 46*  CALCIUM 7.4* 7.0* 6.8* 7.4*  CREATININE 3.02* 2.82* 2.57* 2.52*  GFRNONAA 22* 24* 27* 28*  GFRAA 26* 28* 31* 32*    LIVER FUNCTION TESTS: Recent Labs    10/19/19 0336 10/20/19 0618 11/07/19 1836 11/10/19 0932  BILITOT 0.7 0.5 0.6 0.4  AST 24 26 36 17  ALT 19 19 53* 41  ALKPHOS 86 94 180* 111  PROT 4.6* 5.0* 7.5 5.6*  ALBUMIN 1.6* 1.6* 2.7* 1.8*     Assessment and Plan:  Urinary obstruction secondary to pelvic mass with subsequent bilateral hydronephrosis s/p left ureteral stent placement and attempt at right ureteral stent placement (unsuccessful secondary to tortuosity of right ureter) this AM in OR with Dr. Alinda Money. Plan for image-guided right percutaneous nephrostomy tube placement in IR tentatively for tomorrow 11/15/2019 pending IR scheduling. Patient will be NPO at midnight. Afebrile and WBCs WNL. He does not take blood thinners. INR 1/3 11/10/2019.  Risks and benefits of right percutaneous nephrostomy tube placement were discussed with the patient including, but not limited to, infection, bleeding, significant bleeding causing loss or decrease in renal function or damage to adjacent structures. All of the patient's questions were answered, patient is agreeable to proceed. Unable to obtain consent today secondary to patient undergoing general anesthesia this AM- will obtain consent tomorrow in IR prior to procedure.   Thank you for this interesting consult.  I greatly enjoyed meeting Chase [name] and look forward to participating in their care.  A copy of this report was sent to the requesting provider on this date.  Electronically Signed: Earley Abide, PA-C 11/14/2019, 1:11 PM   I spent a total of 40 Minutes in face to face in clinical consultation, greater than 50% of which was counseling/coordinating care for right hydronephrosis/right nephrostomy tube placement.

## 2019-11-14 NOTE — Progress Notes (Addendum)
Patient ID: Chase [name], male   DOB: [birth year], 54 y.o.   MRN: [id number]   Pt noted to have probable fistula during operative evaluation today.  Will proceed with CT pelvis to evaluate further.  I have also consulted General Surgery to evaluate after CT is done.  Pt also had bilateral hydroureteronephrosis with probable obstruction.  Left ureteral stent was able to be placed but right ureter was too tortuous to place retrograde right ureteral stent.  Will need IR to place right nephrostomy and then subsequent antegrade stent.  Would plan to consult them on Monday.  Finally, if CT does demonstrate evidence of a pelvic abscess, etc., it may be that IR drainage of that collection at the same time as nephrostomy would be indicated pending CT this morning and General Surgery recommendations.   Addendum: CT reviewed and discussed with radiology.  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  Pt to need IR to place right nephrostomy tomorrow and will also ask them to place percutaneous drain into pelvic abscess.

## 2019-11-14 NOTE — Anesthesia Procedure Notes (Signed)
Procedure Name: LMA Insertion Date/Time: 11/14/2019 7:49 AM Performed by: Anne Fu, CRNA Pre-anesthesia Checklist: Patient identified, Emergency Drugs available, Suction available, Patient being monitored and Timeout performed Patient Re-evaluated:Patient Re-evaluated prior to induction Oxygen Delivery Method: Circle system utilized Preoxygenation: Pre-oxygenation with 100% oxygen Induction Type: IV induction Ventilation: Mask ventilation without difficulty LMA: LMA inserted LMA Size: 4.0 Number of attempts: 1 Placement Confirmation: positive ETCO2 and breath sounds checked- equal and bilateral Tube secured with: Tape

## 2019-11-14 NOTE — Progress Notes (Signed)
Was unable to gather 800 CBG due to pt being off unit for a procedure.  Will test blood sugar and get new set of VS when pt returns.

## 2019-11-14 NOTE — Progress Notes (Signed)
Patient ID: Chase [name], male   DOB: [birth year], 54 y.o.   MRN: [id number]  Day of Surgery Subjective: Pt without new complaints.  Still with hematuria.  Objective: Vital signs in last 24 hours: Temp:  [98.1 F (36.7 C)-98.3 F (36.8 C)] 98.3 F (36.8 C) (08/15 0511) Pulse Rate:  [58-79] 58 (08/15 0511) Resp:  [18] 18 (08/15 0511) BP: (107-118)/(67-76) 107/68 (08/15 0511) SpO2:  [97 %-100 %] 98 % (08/15 0511)  Intake/Output from previous day: 08/14 0701 - 08/15 0700 In: 180 [P.O.:180] Out: 3600 [Urine:3600] Intake/Output this shift: No intake/output data recorded.  Physical Exam:  General: Alert and oriented Abd: Soft GU: Tea colored urine in catheter tubing  Lab Results: Recent Labs    11/12/19 0358 11/13/19 0322 11/14/19 0333  HGB 7.9* 7.6* 7.5*  HCT 25.3* 24.7* 24.6*   CBC Latest Ref Rng & Units 11/14/2019 11/13/2019 11/12/2019  WBC 4.0 - 10.5 K/uL 9.4 6.4 3.9(L)  Hemoglobin 13.0 - 17.0 g/dL 7.5(L) 7.6(L) 7.9(L)  Hematocrit 39 - 52 % 24.6(L) 24.7(L) 25.3(L)  Platelets 150 - 400 K/uL 116(L) 96(L) 97(L)     BMET Recent Labs    11/13/19 0322 11/14/19 0333  NA 135 143  K 3.4* 4.4  CL 109 114*  CO2 18* 22  GLUCOSE 122* 90  BUN 50* 46*  CREATININE 2.57* 2.52*  CALCIUM 6.8* 7.4*     Studies/Results: US RENAL  Result Date: 11/12/2019 CLINICAL DATA:  Hydronephrosis EXAM: RENAL / URINARY TRACT ULTRASOUND COMPLETE COMPARISON:  CT 11/07/2019 FINDINGS: Right Kidney: Renal measurements: 9.3 x 5.2 x 6.3 cm = volume: 160 mL . Echogenicity within normal limits. No mass or shadowing stone visualized. Mild hydronephrosis. Left Kidney: Renal measurements: 10.9 x 6.7 x 7.5 cm = volume: 284 mL. Echogenicity within normal limits. No mass or shadowing stone visualized. Mild hydronephrosis. Bladder: There is a large heterogeneous intraluminal mass within the urinary bladder measuring approximately 9.4 x 5.0 x 5.2 cm. No internal vascularity is seen within the mass with  color Doppler. Other: Small volume ascites incidentally noted. IMPRESSION: 1. Large avascular heterogeneous intraluminal mass within the urinary bladder measuring up to 9.4 cm. Findings are most compatible with a large intraluminal blood clot in the setting of hematuria. Follow-up to resolution is recommended to exclude the presence of an underlying malignancy. 2. Mild bilateral hydronephrosis. 3. Small volume ascites. Electronically Signed   By: Davina Poke D.O.   On: 11/12/2019 15:28    Assessment/Plan: 1) Hematuria: Possible bladder mass vs large clot.  Will proceed with cystoscopy and clot evacuation vs TURBT this morning.   2) Hydronephrosis/CKD/AKI:  Cr at reported baseline but still with mild hydronephrosis on ultrasound. Will proceed with bilateral retrograde pyelography and possible stent placement if indicated. 3) Urinary retention: Will evaluate long term management after discharge.   LOS: 6 days   [name] 11/14/2019, 7:36 AM

## 2019-11-14 NOTE — Progress Notes (Signed)
   11/14/19 1049  Assess: MEWS Score  Temp 97.6 F (36.4 C)  BP 99/61  Pulse Rate 65  SpO2 99 %  Assess: MEWS Score  MEWS Temp 0  MEWS Systolic 1  MEWS Pulse 0  MEWS RR 1  MEWS LOC 1  MEWS Score 3  MEWS Score Color Yellow  Assess: if the MEWS score is Yellow or Red  Were vital signs taken at a resting state? Yes  Early Detection of Sepsis Score *See Row Information* Low  MEWS guidelines implemented *See Row Information* No, previously yellow, continue vital signs every 4 hours  Treat  MEWS Interventions Administered prn meds/treatments  Pain Scale 0-10  Pain Score 9  Pain Type Chronic pain  Pain Location Generalized  Patients Stated Pain Goal 2  Pain Intervention(s) Medication (See eMAR)  Notify: Charge Nurse/RN  Name of Charge Nurse/RN Notified Abigail Butts RN  Date Charge Nurse/RN Notified 11/14/19  Time Charge Nurse/RN Notified 1105  Notify: Provider  Provider Name/Title Kc  Date Provider Notified 11/14/19  Time Provider Notified 1105  Notification Type Page  Notification Reason  (Update MD)  Response Other (Comment) (Awaiting orders)  Date of Provider Response 11/14/19  Document  Patient Outcome Other (Comment) (Patient is back from procedure and is stable)  Progress note created (see row info) Yes

## 2019-11-14 NOTE — Progress Notes (Signed)
Patient gives permission to both of his sons to have information and updates.

## 2019-11-14 NOTE — Progress Notes (Signed)
Patient has returned from his procedure. Patient is alert and oriented.

## 2019-11-14 NOTE — Op Note (Signed)
Preoperative diagnosis: 1.  Hematuria 2.  Bladder mass 3.  Bilateral hydronephrosis  Postoperative diagnosis: 1.  Hematuria 2.  Probable colovesical fistula 3.  Bilateral hydronephrosis  Procedures: 1.  Cystoscopy 2.  Cystogram with interpretation 3.  Bilateral retrograde pyelography with interpretation 4.  Left ureteral stent placement (6 x 26-no string) 5.  Fulguration of bladder  Surgeon: Pryor Curia MD  Anesthesia: General  Complications: None  EBL: Minimal  Intraoperative findings: Cystography was performed via a 6 French ureteral catheter.  This was placed within a midline posterior defect in the bladder with contrast injected.  Findings were concerning for a possible fistula between the colon and posterior bladder.  There also was a foul smell upon entering the bladder during cystoscopy also concerning for possible colovesical fistula.  Bilateral retrograde pyelography was also performed with a 6 French ureteral catheter and Omnipaque contrast.  On the left side, there was noted to be dilation of the ureter down to the distal ureter consistent with extrinsic obstruction.  The ureter was tortuous and dilated.  On the right side, similar findings were identified although there was an area of narrowing at the level of the iliac vessels as well.  No intrinsic filling defects were identified on either side.  No renal collecting system abnormalities were noted except for hydronephrosis.  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  He was admitted to [facility] in [place], [place].  He apparently was noted to have hematuria and did have a urologic evaluation there and was told that he might have a mass behind his bladder.  He has an indwelling catheter and was discharged to a skilled nursing facility with his catheter.  He subsequently developed sepsis from a urinary tract source presumably.  He also had persistent hematuria and  urologic consultation was obtained.  Further imaging with CT without contrast and ultrasonography indicated a possible bladder mass versus clot.  He therefore presents to go to the operating room for cystoscopy and possible clot evacuation versus bladder tumor resection as indicated.  He also had hydronephrosis with acute and possible chronic kidney disease.  This was also to be interrogated at the time of this procedure with possible stent placement as needed.  The potential risks, complications, and the expected recovery process was discussed.  Informed consent was obtained.  Description of procedure: The patient was taken to the operating room and a general anesthetic was administered.  He was given preoperative antibiotics, placed in the dorsolithotomy position, and prepped and draped in usual sterile fashion.  A preoperative timeout was performed.  Cystoscopy was initially performed with a 22 French resectoscope and a visual obturator.  The anterior urethra was normal.  In the posterior urethra at the junction of the bulbar urethra and the prostatic urethra, there appeared to be evidence of catheter trauma.  With further navigation, the verumontanum was identified and the scope was navigated into the bladder.  He did have lateral lobe hypertrophy without a very large median lobe.  Inspection of the bladder appeared cloudy with some blood.  However, no blood clot was identified.  On continuous irrigation, and using water for irrigation, I was eventually able to visualize the bladder well although it was hard to distend the bladder.  There also was a very foul odor noted during cystoscopy that appeared unusual.  Once the bladder was able to be visualized, there appeared to be some inflammation posteriorly possibly consistent with his  indwelling chronic catheter.  However, no bladder tumors were identified either.  I then replaced the resectoscope with a standard 22 French cystoscope sheath.  It was difficult  to initially identify the ureteral orifices.  An opening was identified eventually although this appeared to be midline on fluoroscopy.  Omnipaque contrast was injected and this appeared to fill out a cavity posterior to the bladder in the midline concerning for a possible colovesical fistula.  This was consistent with a foul odor noted.  There also appeared to be material that was somewhat cloudy that would drain from this site.  There also was bleeding from around this area.  Attention then turned to the ureteral orifices.  Indigocarmine was administered although no blue drainage was ever identified.  Eventually, the left ureteral orifice was identified and intubated with a 6 French ureteral catheter.  Omnipaque contrast was injected and this revealed a dilated and tortuous ureter down to the very distal ureter consistent with extrinsic obstruction.  I navigated a 0.38 sensor guidewire up into the renal collecting system and the ureter appeared to straighten.  I was then able to place a 6 x 26 double-J ureteral stent using Seldinger technique.  The wire was removed with a good curl noted in the renal pelvis as well as within the bladder.  I then turned my attention to the right ureteral orifice which was eventually identified after some difficulty.  The 6 French ureteral catheter was used to intubate the distal right ureter.  This also revealed dilation down to the distal ureter although there was also an area of narrowing just below the iliac vessels.  No intrinsic filling defects were identified.  The right ureter was even more tortuous and despite significant attempt, I was unable to straighten the ureter.  The wire was able to be manipulated all the way into the renal collecting system but I was unable to pass a 6 [place] ureteral catheter over the wire into the renal collecting system.  I did make 1 attempt to pass a 6 x 26 double-J ureteral stent over the wire.  However, this was unable to navigate the  tortuosity and I was unable to get the proximal end of the stent into the renal collecting system despite multiple attempts at straightening the ureter proximally.  The stent and the wire were therefore removed.  It was felt that the patient would require right nephrostomy tube placement with antegrade stent placement eventually.  Attention then returned to the bladder.  There were a couple of bleeding sites that were fulgurated with a Bugbee electrode.  In addition, the bleeding sites around the probable fistula were fulgurated as well as the fistula itself.  A 0.38 sensor guidewire was then advanced into the bladder through the cystoscope and the cystoscope was removed.  A 20 French council tip catheter was placed over the wire into the bladder and confirmed on fluoroscopy to be in appropriate position.  The procedure was ended.  He was able to be awakened and transferred to recovery unit.

## 2019-11-14 NOTE — Anesthesia Preprocedure Evaluation (Signed)
Anesthesia Evaluation  Patient identified by MRN, date of birth, ID band Patient awake    Reviewed: Allergy & Precautions, NPO status , Patient's Chart, lab work & pertinent test results  Airway Mallampati: II  TM Distance: >3 FB Neck ROM: Full    Dental  (+) Teeth Intact, Dental Advisory Given, Chipped,    Pulmonary neg pulmonary ROS,    Pulmonary exam normal breath sounds clear to auscultation       Cardiovascular negative cardio ROS Normal cardiovascular exam Rhythm:Regular Rate:Normal     Neuro/Psych PSYCHIATRIC DISORDERS Anxiety  Neuromuscular disease CVA, Residual Symptoms    GI/Hepatic Neg liver ROS, GERD  Medicated,  Endo/Other  negative endocrine ROS  Renal/GU Renal InsufficiencyRenal disease     Musculoskeletal negative musculoskeletal ROS (+)   Abdominal   Peds  Hematology  (+) Blood dyscrasia (Thrombocytopenia), anemia ,   Anesthesia Other Findings Day of surgery medications reviewed with the patient.  Reproductive/Obstetrics                             Anesthesia Physical Anesthesia Plan  ASA: III  Anesthesia Plan: General   Post-op Pain Management:    Induction: Intravenous  PONV Risk Score and Plan: 3 and Midazolam, Dexamethasone and Ondansetron  Airway Management Planned: LMA  Additional Equipment:   Intra-op Plan:   Post-operative Plan: Extubation in OR  Informed Consent: I have reviewed the patients History and Physical, chart, labs and discussed the procedure including the risks, benefits and alternatives for the proposed anesthesia with the patient or authorized representative who has indicated his/her understanding and acceptance.     Dental advisory given  Plan Discussed with: CRNA  Anesthesia Plan Comments:         Anesthesia Quick Evaluation

## 2019-11-14 NOTE — Anesthesia Postprocedure Evaluation (Signed)
Anesthesia Post Note  Patient: Chase [name]  Procedure(s) Performed: CYSTOSCOPY WITH RETROGRADE PYELOGRAM bilateral Wyvonnia Dusky STENT PLACEMENT left fulguration bladder . cystogram (N/A Bladder)     Patient location during evaluation: PACU Anesthesia Type: General Level of consciousness: awake and alert, awake and oriented Pain management: pain level controlled Vital Signs Assessment: post-procedure vital signs reviewed and stable Respiratory status: spontaneous breathing, nonlabored ventilation, respiratory function stable and patient connected to nasal cannula oxygen Cardiovascular status: blood pressure returned to baseline and stable Postop Assessment: no apparent nausea or vomiting Anesthetic complications: no   No complications documented.  Last Vitals:  Vitals:   11/14/19 1000 11/14/19 1012  BP: 96/65 102/64  Pulse: 68 73  Resp: 12 12  Temp:  36.4 C  SpO2: 97% 96%    Last Pain:  Vitals:   11/14/19 1012  TempSrc:   PainSc: 5                  Catalina Gravel

## 2019-11-14 NOTE — Consult Note (Signed)
Reason for Consult:colovesicule fistula Referring Physician: Dr. Margurite Auerbach Sr is an 54 y.o. male.  HPI: This is a 54 year old gentleman with a chronic indwelling Foley catheter with a history of a recent CVA, CKD, chronic pain, etc. He underwent a cystoscopy today and cystogram for continued hematuria with bilateral hydronephrosis and possible fistula. There was no mass in the bladder. There was an opening through which Dr. Alinda Money injected contrast and then the patient had a follow-up CT scan of the pelvis. The initial read is not back but it appears to have a fistula to the sigmoid colon. Currently, the patient is without complaints. He has no abdominal pain. He has a Foley catheter in place. He was initially admitted with sepsis and shock which was thought to be from a urinary source with acute on chronic kidney disease. He is slowly improving. He has had a previous gastric bypass as well as treatment for lymphoma. He reports that his appetite is improving. He is having bowel movements.  Past Medical History:  Diagnosis Date  . Cervical radiculopathy    with left sided weakness  . Chemical exposure    service related injury  . Chronic pain   . GIB (gastrointestinal bleeding)   . PTSD (post-traumatic stress disorder)   . Renal disorder    CKD  . Urinary retention    due to bladder outlet obstruction    Past Surgical History:  Procedure Laterality Date  . GASTRIC BYPASS    . HERNIA REPAIR      Family History  Problem Relation Age of Onset  . Renal cancer Father   . Heart disease Paternal Grandfather     Social History:  reports that he has never smoked. He uses smokeless tobacco. He reports that he does not drink alcohol and does not use drugs.  Allergies:  Allergies  Allergen Reactions  . Nsaids Other (See Comments)    Bleeding GI Ulceration    Medications: I have reviewed the patient's current medications.  Results for orders placed or performed during  the hospital encounter of 11/07/19 (from the past 48 hour(s))  Glucose, capillary     Status: Abnormal   Collection Time: 11/12/19 11:40 AM  Result Value Ref Range   Glucose-Capillary 101 (H) 70 - 99 mg/dL    Comment: Glucose reference range applies only to samples taken after fasting for at least 8 hours.  Glucose, capillary     Status: Abnormal   Collection Time: 11/12/19  4:08 PM  Result Value Ref Range   Glucose-Capillary 129 (H) 70 - 99 mg/dL    Comment: Glucose reference range applies only to samples taken after fasting for at least 8 hours.  Glucose, capillary     Status: None   Collection Time: 11/12/19  9:45 PM  Result Value Ref Range   Glucose-Capillary 79 70 - 99 mg/dL    Comment: Glucose reference range applies only to samples taken after fasting for at least 8 hours.  Basic metabolic panel     Status: Abnormal   Collection Time: 11/13/19  3:22 AM  Result Value Ref Range   Sodium 135 135 - 145 mmol/L   Potassium 3.4 (L) 3.5 - 5.1 mmol/L   Chloride 109 98 - 111 mmol/L   CO2 18 (L) 22 - 32 mmol/L   Glucose, Bld 122 (H) 70 - 99 mg/dL    Comment: Glucose reference range applies only to samples taken after fasting for at least 8 hours.  BUN 50 (H) 6 - 20 mg/dL   Creatinine, Ser 2.57 (H) 0.61 - 1.24 mg/dL   Calcium 6.8 (L) 8.9 - 10.3 mg/dL   GFR calc non Af Amer 27 (L) >60 mL/min   GFR calc Af Amer 31 (L) >60 mL/min   Anion gap 8 5 - 15    Comment: Performed at [facility], [place] [address], [place], [place] [address]  CBC     Status: Abnormal   Collection Time: 11/13/19  3:22 AM  Result Value Ref Range   WBC 6.4 4.0 - 10.5 K/uL   RBC 2.57 (L) 4.22 - 5.81 MIL/uL   Hemoglobin 7.6 (L) 13.0 - 17.0 g/dL   HCT 24.7 (L) 39 - 52 %   MCV 96.1 80.0 - 100.0 fL   MCH 29.6 26.0 - 34.0 pg   MCHC 30.8 30.0 - 36.0 g/dL   RDW 19.7 (H) 11.5 - 15.5 %   Platelets 96 (L) 150 - 400 K/uL    Comment: REPEATED TO VERIFY Immature Platelet Fraction may be clinically  indicated, consider ordering this additional test [id number] CONSISTENT WITH PREVIOUS RESULT    nRBC 0.0 0.0 - 0.2 %    Comment: Performed at [facility], [place] [address] [place], [place] [address]  Magnesium     Status: None   Collection Time: 11/13/19  3:22 AM  Result Value Ref Range   Magnesium 2.1 1.7 - 2.4 mg/dL    Comment: Performed at [facility], [place] [address] [place],  [address]  Glucose, capillary     Status: Abnormal   Collection Time: 11/13/19  7:50 AM  Result Value Ref Range   Glucose-Capillary 127 (H) 70 - 99 mg/dL    Comment: Glucose reference range applies only to samples taken after fasting for at least 8 hours.  Glucose, capillary     Status: Abnormal   Collection Time: 11/13/19 11:37 AM  Result Value Ref Range   Glucose-Capillary 164 (H) 70 - 99 mg/dL    Comment: Glucose reference range applies only to samples taken after fasting for at least 8 hours.  Glucose, capillary     Status: None   Collection Time: 11/13/19  4:42 PM  Result Value Ref Range   Glucose-Capillary 91 70 - 99 mg/dL    Comment: Glucose reference range applies only to samples taken after fasting for at least 8 hours.  Glucose, capillary     Status: Abnormal   Collection Time: 11/13/19  9:31 PM  Result Value Ref Range   Glucose-Capillary 121 (H) 70 - 99 mg/dL    Comment: Glucose reference range applies only to samples taken after fasting for at least 8 hours.  Basic metabolic panel     Status: Abnormal   Collection Time: 11/14/19  3:33 AM  Result Value Ref Range   Sodium 143 135 - 145 mmol/L    Comment: DELTA CHECK NOTED   Potassium 4.4 3.5 - 5.1 mmol/L    Comment: DELTA CHECK NOTED   Chloride 114 (H) 98 - 111 mmol/L   CO2 22 22 - 32 mmol/L   Glucose, Bld 90 70 - 99 mg/dL    Comment: Glucose reference range applies only to samples taken after fasting for at least 8 hours.   BUN 46 (H) 6 - 20 mg/dL   Creatinine, Ser 2.52 (H) 0.61 - 1.24 mg/dL    Calcium 7.4 (L) 8.9 - 10.3 mg/dL   GFR calc non Af Amer 28 (  L) >60 mL/min   GFR calc Af Amer 32 (L) >60 mL/min   Anion gap 7 5 - 15    Comment: Performed at [facility], [place] [address] [place], [place] [address]  CBC     Status: Abnormal   Collection Time: 11/14/19  3:33 AM  Result Value Ref Range   WBC 9.4 4.0 - 10.5 K/uL   RBC 2.53 (L) 4.22 - 5.81 MIL/uL   Hemoglobin 7.5 (L) 13.0 - 17.0 g/dL   HCT 24.6 (L) 39 - 52 %   MCV 97.2 80.0 - 100.0 fL   MCH 29.6 26.0 - 34.0 pg   MCHC 30.5 30.0 - 36.0 g/dL   RDW 19.5 (H) 11.5 - 15.5 %   Platelets 116 (L) 150 - 400 K/uL    Comment: REPEATED TO VERIFY Immature Platelet Fraction may be clinically indicated, consider ordering this additional test [id number] CONSISTENT WITH PREVIOUS RESULT    nRBC 0.0 0.0 - 0.2 %    Comment: Performed at [facility], [place] [address], [place], [place] [address]  Magnesium     Status: None   Collection Time: 11/14/19  3:33 AM  Result Value Ref Range   Magnesium 2.0 1.7 - 2.4 mg/dL    Comment: Performed at [facility], [place] [address], [place], [place] [address]    US RENAL  Result Date: 11/12/2019 CLINICAL DATA:  Hydronephrosis EXAM: RENAL / URINARY TRACT ULTRASOUND COMPLETE COMPARISON:  CT 11/07/2019 FINDINGS: Right Kidney: Renal measurements: 9.3 x 5.2 x 6.3 cm = volume: 160 mL . Echogenicity within normal limits. No mass or shadowing stone visualized. Mild hydronephrosis. Left Kidney: Renal measurements: 10.9 x 6.7 x 7.5 cm = volume: 284 mL. Echogenicity within normal limits. No mass or shadowing stone visualized. Mild hydronephrosis. Bladder: There is a large heterogeneous intraluminal mass within the urinary bladder measuring approximately 9.4 x 5.0 x 5.2 cm. No internal vascularity is seen within the mass with color Doppler. Other: Small volume ascites incidentally noted. IMPRESSION: 1. Large avascular heterogeneous intraluminal mass within the  urinary bladder measuring up to 9.4 cm. Findings are most compatible with a large intraluminal blood clot in the setting of hematuria. Follow-up to resolution is recommended to exclude the presence of an underlying malignancy. 2. Mild bilateral hydronephrosis. 3. Small volume ascites. Electronically Signed   By: Davina Poke D.O.   On: 11/12/2019 15:28   DG C-Arm 1-60 Min-No Report  Result Date: 11/14/2019 Fluoroscopy was utilized by the requesting physician.  No radiographic interpretation.    Review of Systems Blood pressure 99/61, pulse 65, temperature 97.6 F (36.4 C), temperature source Oral, resp. rate 12, height 5\' 8"  (1.727 m), weight 55.2 kg, SpO2 99 %. Physical Exam Constitutional:      Comments: He is a thin, cachectic appearing gentleman who is in no distress  Cardiovascular:     Rate and Rhythm: Normal rate and regular rhythm.     Heart sounds: No murmur heard.   Pulmonary:     Effort: Pulmonary effort is normal. No respiratory distress.     Breath sounds: Normal breath sounds.  Abdominal:     General: Abdomen is flat. There is no distension.     Palpations: Abdomen is soft.     Tenderness: There is no abdominal tenderness. There is no guarding.  Skin:    General: Skin is warm and dry.  Neurological:     Mental Status: He is oriented to person, place, and time.  Mental status is at baseline.  Psychiatric:        Thought Content: Thought content normal.        Judgment: Judgment normal.     Assessment/Plan: Colovesical fistula  I discussed the diagnosis with the patient. We discussed the fact that this would not probably spontaneously heal on its own and that surgery with resection of the area of sigmoid colon involved with the fistula would need to be resected. His main issue from a surgical standpoint is his nutrition status. We'll check a prealbumin. The decision will be made to eventually proceed with a resection with anastomosis versus a colostomy depending  any improvement in his nutritional status. Surgery will be performed by one of our colorectal specialist. I will discuss the case with one of them. If he is not able to improve his nutrition orally, we would have to consider a core track and tube feeds versus a PICC line and TNA. We will follow him with you.  Coralie Keens 11/14/2019, 11:05 AM

## 2019-11-14 NOTE — Progress Notes (Signed)
   11/14/19 1235  Assess: MEWS Score  Temp 98.2 F (36.8 C)  BP 123/71  Pulse Rate (!) 59  Resp 14  SpO2 99 %  O2 Device Room Air  Assess: MEWS Score  MEWS Temp 0  MEWS Systolic 0  MEWS Pulse 0  MEWS RR 0  MEWS LOC 0  MEWS Score 0  MEWS Score Color Green  Assess: if the MEWS score is Yellow or Red  Were vital signs taken at a resting state? Yes  Focused Assessment No change from prior assessment  Early Detection of Sepsis Score *See Row Information* Low  MEWS guidelines implemented *See Row Information* No, previously yellow, continue vital signs every 4 hours  Treat  MEWS Interventions Other (Comment) (Patient is resting)  Pain Scale 0-10  Pain Score 3  Faces Pain Scale 2  Pain Type Chronic pain  Pain Location Generalized  Patients Stated Pain Goal 2  Pain Intervention(s) Repositioned;Food;Distraction  Notify: Charge Nurse/RN  Name of Charge Nurse/RN Notified Abigail Butts RN  Date Charge Nurse/RN Notified 11/14/19  Time Charge Nurse/RN Notified 84  Notify: Provider  Provider Name/Title Kc  Date Provider Notified 11/14/19  Time Provider Notified 1348  Notification Type Page  Notification Reason Other (Comment) (MD update)  Response Other (Comment) (Waiting on response)  Document  Patient Outcome Other (Comment) (Patient is stable)

## 2019-11-14 NOTE — Progress Notes (Signed)
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  Initially found to be in septic shock from urosepsis started on pressors, Urine culture with E coli and pseudomonas and Blood culture with E coli and proteus, has been on antibiotics then transition to cefepime. He  Reports he had hematuria from traumatic pulling of foley when it got caught during bed transfer. But he reports he has had hematuria before. 8/13 seen by urology and renal ultrasound shows "Large avascular heterogeneous intraluminal mass within the urinary bladder measuring up to 9.4 cm. Findings are most compatible with a large intraluminal blood clot in the setting of hematuria. Follow-up to resolution is recommended to exclude the presence of an underlying malignancy." 8/15-underwent cystoscopy, bilateral retrograde pyelogram with left ureteral stent placement and fulguration of bladder, found to have bilateral hydronephrosis and probable colovesical fistula.  Subjective:  Seen this afternoon underwent cystoscopy with a left ureteric stent and and then CT abdomen this morning He denies any abdominal pain nausea vomiting fever or chills. Appears frustrated that he needs to go for more procedure tomorrow Assessment & Plan:  Septic shock 2/2 UTI, Urine culture with E coli and pseudomonas and Blood culture with E coli and proteus:Shock physiology resolved.  On midodrine.  On Cefepime and will continue the same.  Could be today and there appeared to be probable fistula and had CT scanning per urology, appears to be colovesical  fistula with a probably large abscess cavity between the colon and the bladder. Urology plan is for IR to place right nephrostomy tomorrow and percutaneous drain into pelvic abscess.  Surgeon also consulted Dr. Ninfa Linden has seen the patient.  Keep the Foley in, has pinkish hematuric urine.  Hemodynamically stable.  AKI on CKD stage IIIa chronic urine retention secondary to BPH.  Baseline creatinine 2.3 peaked to 3.0.  Creatinine 2.5.  He does have bilateral hydronephrosis, left ureteric stent placed today and plan for right nephrostomy tube tomorrow, obstructive uropathy may be playing some role in his CKD including sepsis physiology for AKI. Recent Labs  Lab 11/10/19 0932 11/11/19 0821 11/12/19 0358 11/13/19 0322 11/14/19 0333  BUN 62* 62* 54* 50* 46*  CREATININE 3.05* 3.02* 2.82* 2.57* 2.52*   Possible colovesical fistula with large abscess between bladder and colon: For right nephrostomy tube and percutaneous drain of the abscess tomorrow urology and general surgery on board.  Acute blood loss anemia due to hematuria in the setting of chronic anemia. Hemoglobin dropped as low as 6.2 needing 1 unit PRBC. Hb stable. Recent Labs  Lab 11/10/19 0932 11/11/19 0329 11/12/19 0358 11/13/19 0322 11/14/19 0333  HGB 6.2* 7.3* 7.9* 7.6* 7.5*   Recent CVA in July 2021 on aspirin, continue PT OT.  Chronic pain on home Norco, Cymbalta and Neurontin  Adult failure to thrive with history of gastric bypass, sacral stage II pressure ulcer no signs of infection.  Continue nutritional support augment, encourage ambulation PT OT.  GERD continue PPI..  DVT prophylaxis: Place and maintain sequential compression device Start: 11/10/19 1028 Code Status:   Code Status: Full Code  Family Communication: plan of care discussed  with patient at bedside.  Status is: Inpatient  Remains inpatient appropriate because:IV treatments appropriate due to intensity of illness or inability to take PO and Inpatient  level of care appropriate due to severity of illness   Dispo: The patient is from: SNF              Anticipated d/c is to: SNF              Anticipated d/c date is: 3 days              Patient currently is not medically stable to d/c.  Remains hospitalized for ongoing treatment of his renal failure sepsis, abscess  Nutrition: Diet Order            Diet NPO time specified  Diet effective midnight           Diet regular Room service appropriate? Yes; Fluid consistency: Thin  Diet effective now                 Nutrition Problem: Severe Malnutrition Etiology: chronic illness Signs/Symptoms: severe fat depletion, severe muscle depletion Interventions: Other (Comment) [employer]) Body mass index is 18.5 kg/m.  Consultants:see note  Procedures:see note Microbiology:see note Blood Culture    Component Value Date/Time   SDES  11/07/2019 1845    URINE, RANDOM Performed at [facility], [place] [address], [place], [place] [address]    SPECREQUEST  11/07/2019 1845    NONE Performed at [facility], [place] [address], [place],  [address]    CULT (A) 11/07/2019 1845    70,000 COLONIES/mL ESCHERICHIA COLI >=100,000 COLONIES/mL PSEUDOMONAS AERUGINOSA    REPTSTATUS 11/10/2019 FINAL 11/07/2019 1845    Other culture-see note  Medications: Scheduled Meds:  Chlorhexidine Gluconate Cloth  6 each Topical Daily   DULoxetine  60 mg Oral Daily   feeding supplement (KATE FARMS STANDARD 1.4)  325 mL Oral BID BM   ferrous sulfate  325 mg Oral Q breakfast   gabapentin  300 mg Oral BID   insulin aspart  0-9 Units Subcutaneous TID WC   midodrine  5 mg Oral TID AC   multivitamin with minerals  1 tablet Oral Daily   pantoprazole  40 mg Oral BID   tamsulosin  0.4 mg Oral Daily   Continuous Infusions:  ceFEPime (MAXIPIME) IV 2 g (11/14/19 0919)   lactated ringers 75 mL/hr at 11/14/19 0742    Antimicrobials: Anti-infectives (From  admission, onward)   Start     Dose/Rate Route Frequency Ordered Stop   11/10/19 0830  ceFEPIme (MAXIPIME) 2 g in sodium chloride 0.9 % 100 mL IVPB     Discontinue     2 g 200 mL/hr over 30 Minutes Intravenous Every 24 hours 11/10/19 0815     11/08/19 2000  ceFEPIme (MAXIPIME) 2 g in sodium chloride 0.9 % 100 mL IVPB  Status:  Discontinued        2 g 200 mL/hr over 30 Minutes Intravenous Every 24 hours 11/08/19 0025 11/08/19 1037   11/08/19 2000  vancomycin (VANCOREADY) IVPB 500 mg/100 mL  Status:  Discontinued        500 mg 100 mL/hr over 60 Minutes Intravenous Every 24 hours 11/08/19 0027 11/08/19 1037   11/08/19 1800  cefTRIAXone (ROCEPHIN) 2 g in sodium chloride 0.9 % 100 mL IVPB  Status:  Discontinued        2 g 200 mL/hr over 30 Minutes Intravenous Every 24 hours 11/08/19 1037  11/10/19 0815   11/08/19 0300  ceFEPIme (MAXIPIME) 1 g in sodium chloride 0.9 % 100 mL IVPB  Status:  Discontinued        1 g 200 mL/hr over 30 Minutes Intravenous Every 12 hours 11/08/19 0021 11/08/19 0025   11/07/19 1930  vancomycin (VANCOREADY) IVPB 1250 mg/250 mL        1,250 mg 166.7 mL/hr over 90 Minutes Intravenous  Once 11/07/19 1858 11/07/19 2154   11/07/19 1900  ceFEPIme (MAXIPIME) 2 g in sodium chloride 0.9 % 100 mL IVPB        2 g 200 mL/hr over 30 Minutes Intravenous  Once 11/07/19 1854 11/07/19 2009   11/07/19 1900  metroNIDAZOLE (FLAGYL) IVPB 500 mg        500 mg 100 mL/hr over 60 Minutes Intravenous  Once 11/07/19 1854 11/07/19 2325   11/07/19 1900  vancomycin (VANCOCIN) IVPB 1000 mg/200 mL premix  Status:  Discontinued        1,000 mg 200 mL/hr over 60 Minutes Intravenous  Once 11/07/19 1854 11/07/19 1858       Objective: Vitals: Today's Vitals   11/14/19 1012 11/14/19 1049 11/14/19 1100 11/14/19 1235  BP: 102/64 99/61  123/71  Pulse: 73 65  (!) 59  Resp: 12   14  Temp: 97.6 F (36.4 C) 97.6 F (36.4 C)  98.2 F (36.8 C)  TempSrc:  Oral  Oral  SpO2: 96% 99%  99%  Weight:       Height:      PainSc: 5  9  9       Intake/Output Summary (Last 24 hours) at 11/14/2019 1304 Last data filed at 11/14/2019 1000 Gross per 24 hour  Intake 880 ml  Output 3051 ml  Net -2171 ml   Filed Weights   11/11/19 0500  Weight: 55.2 kg   Weight change:    Intake/Output from previous day: 08/14 0701 - 08/15 0700 In: 180 [P.O.:180] Out: 3600 [Urine:3600] Intake/Output this shift: Total I/O In: 700 [I.V.:600; IV Piggyback:100] Out: 101 [Urine:100; Blood:1]  Examination:  General exam: AAOx3,NAD,weak appearing. HEENT:Oral mucosa moist, Ear/Nose WNL grossly, dentition normal. Respiratory system: bilaterally clear,no wheezing or crackles,no use of accessory muscle Cardiovascular system: S1 & S2 +, No JVD,. Gastrointestinal system: Abdomen soft, NT,ND, BS+ Nervous System:Alert, awake, moving extremities well on the right side, very weak left leg.   Extremities: No edema, distal peripheral pulses palpable.  Skin: No rashes,no icterus. MSK: Normal muscle bulk,tone, power Foley In place with pinkish urine.  Data Reviewed: I have personally reviewed following labs and imaging studies CBC: Recent Labs  Lab 11/07/19 1836 11/07/19 1836 11/08/19 0329 11/08/19 0329 11/10/19 0932 11/11/19 0329 11/12/19 0358 11/13/19 0322 11/14/19 0333  WBC 3.3*   < > 27.6*   < > 9.9 8.7 3.9* 6.4 9.4  NEUTROABS 3.1  --  25.9*  --   --   --   --   --   --   HGB 9.8*   < > 7.6*   < > 6.2* 7.3* 7.9* 7.6* 7.5*  HCT 33.1*   < > 25.0*   < > 20.5* 23.3* 25.3* 24.7* 24.6*  MCV 98.5   < > 96.9   < > 94.9 93.6 94.1 96.1 97.2  PLT 178   < > 163   < > 113* 114* 97* 96* 116*   < > = values in this interval not displayed.   Basic Metabolic Panel: Recent Labs  Lab 11/08/19 0329 11/08/19 1136  11/10/19 0932 11/11/19 6546 11/12/19 0358 11/13/19 0322 11/14/19 0333  NA 128*   < > 137 139 143 135 143  K 3.8   < > 4.0 3.6 3.9 3.4* 4.4  CL 101   < > 109 109 113* 109 114*  CO2 13*   < > 19* 21* 18*  18* 22  GLUCOSE 149*   < > 127* 110* 79 122* 90  BUN 45*   < > 62* 62* 54* 50* 46*  CREATININE 3.06*   < > 3.05* 3.02* 2.82* 2.57* 2.52*  CALCIUM 7.0*   < > 7.5* 7.4* 7.0* 6.8* 7.4*  MG 1.3*  --   --  2.4 2.2 2.1 2.0  PHOS 4.4  --   --   --   --   --   --    < > = values in this interval not displayed.   GFR: Estimated Creatinine Clearance: 25.9 mL/min (A) (by C-G formula based on SCr of 2.52 mg/dL (H)). Liver Function Tests: Recent Labs  Lab 11/07/19 1836 11/10/19 0932  AST 36 17  ALT 53* 41  ALKPHOS 180* 111  BILITOT 0.6 0.4  PROT 7.5 5.6*  ALBUMIN 2.7* 1.8*   No results for input(s): LIPASE, AMYLASE in the last 168 hours. No results for input(s): AMMONIA in the last 168 hours. Coagulation Profile: Recent Labs  Lab 11/07/19 1836 11/10/19 1052  INR 1.2 1.3*   Cardiac Enzymes: No results for input(s): CKTOTAL, CKMB, CKMBINDEX, TROPONINI in the last 168 hours. BNP (last 3 results) No results for input(s): PROBNP in the last 8760 hours. HbA1C: No results for input(s): HGBA1C in the last 72 hours. CBG: Recent Labs  Lab 11/13/19 0750 11/13/19 1137 11/13/19 1642 11/13/19 2131 11/14/19 1116  GLUCAP 127* 164* 91 121* 101*   Lipid Profile: No results for input(s): CHOL, HDL, LDLCALC, TRIG, CHOLHDL, LDLDIRECT in the last 72 hours. Thyroid Function Tests: No results for input(s): TSH, T4TOTAL, FREET4, T3FREE, THYROIDAB in the last 72 hours. Anemia Panel: No results for input(s): VITAMINB12, FOLATE, FERRITIN, TIBC, IRON, RETICCTPCT in the last 72 hours. Sepsis Labs: Recent Labs  Lab 11/07/19 1836 11/07/19 2036 11/08/19 0329 11/08/19 0724  LATICACIDVEN 4.0* 4.1* 4.9* 4.4*    Recent Results (from the past 240 hour(s))  Culture, blood (Routine x 2)     Status: Abnormal   Collection Time: 11/07/19  6:36 PM   Specimen: BLOOD  Result Value Ref Range Status   Specimen Description   Final    BLOOD RIGHT ANTECUBITAL Performed at [facility], [place] [address], [place], [place] [address]    Special Requests   Final    BOTTLES DRAWN AEROBIC AND ANAEROBIC Blood Culture adequate volume Performed at [facility] [address], [place], [place] [address]    Culture  Setup Time   Final    GRAM NEGATIVE RODS IN BOTH AEROBIC AND ANAEROBIC BOTTLES CRITICAL RESULT CALLED TO, READ BACK BY AND VERIFIED WITH: Guadlupe Spanish PharmD 10:00 11/08/19 ([email or web address]) Performed at [facility], [address], [place], [place] [address]    Culture ESCHERICHIA COLI PROTEUS MIRABILIS  (A)  Final   Report Status 11/10/2019 FINAL  Final   Organism ID, Bacteria ESCHERICHIA COLI  Final   Organism ID, Bacteria PROTEUS MIRABILIS  Final      Susceptibility   Escherichia coli - MIC*    AMPICILLIN <=2 SENSITIVE Sensitive     CEFAZOLIN <=4 SENSITIVE Sensitive  CEFEPIME <=0.12 SENSITIVE Sensitive     CEFTAZIDIME <=1 SENSITIVE Sensitive     CEFTRIAXONE <=0.25 SENSITIVE Sensitive     CIPROFLOXACIN <=0.25 SENSITIVE Sensitive     GENTAMICIN <=1 SENSITIVE Sensitive     IMIPENEM <=0.25 SENSITIVE Sensitive     TRIMETH/SULFA <=20 SENSITIVE Sensitive     AMPICILLIN/SULBACTAM <=2 SENSITIVE Sensitive     PIP/TAZO <=4 SENSITIVE Sensitive     * ESCHERICHIA COLI   Proteus mirabilis - MIC*    AMPICILLIN <=2 SENSITIVE Sensitive     CEFAZOLIN <=4 SENSITIVE Sensitive     CEFEPIME <=0.12 SENSITIVE Sensitive     CEFTAZIDIME <=1 SENSITIVE Sensitive     CEFTRIAXONE <=0.25 SENSITIVE Sensitive     CIPROFLOXACIN <=0.25 SENSITIVE Sensitive     GENTAMICIN <=1 SENSITIVE Sensitive     IMIPENEM 2 SENSITIVE Sensitive     TRIMETH/SULFA <=20 SENSITIVE Sensitive     AMPICILLIN/SULBACTAM <=2 SENSITIVE Sensitive     PIP/TAZO <=4 SENSITIVE Sensitive     * PROTEUS MIRABILIS  Blood Culture ID Panel (Reflexed)     Status: Abnormal   Collection Time: 11/07/19  6:36 PM  Result Value Ref Range Status   Enterococcus faecalis NOT DETECTED NOT DETECTED Final    Enterococcus Faecium NOT DETECTED NOT DETECTED Final   Listeria monocytogenes NOT DETECTED NOT DETECTED Final   Staphylococcus species NOT DETECTED NOT DETECTED Final   Staphylococcus aureus (BCID) NOT DETECTED NOT DETECTED Final   Staphylococcus epidermidis NOT DETECTED NOT DETECTED Final   Staphylococcus lugdunensis NOT DETECTED NOT DETECTED Final   Streptococcus species NOT DETECTED NOT DETECTED Final   Streptococcus agalactiae NOT DETECTED NOT DETECTED Final   Streptococcus pneumoniae NOT DETECTED NOT DETECTED Final   Streptococcus pyogenes NOT DETECTED NOT DETECTED Final   A.calcoaceticus-baumannii NOT DETECTED NOT DETECTED Final   Bacteroides fragilis NOT DETECTED NOT DETECTED Final   Enterobacterales DETECTED (A) NOT DETECTED Final    Comment: CRITICAL RESULT CALLED TO, READ BACK BY AND VERIFIED WITH: Guadlupe Spanish PharmD 10:00 11/08/19 ([email or web address])    Enterobacter cloacae complex NOT DETECTED NOT DETECTED Final   Escherichia coli DETECTED (A) NOT DETECTED Final    Comment: CRITICAL RESULT CALLED TO, READ BACK BY AND VERIFIED WITH: Guadlupe Spanish PharmD 10:00 11/08/19 ([email or web address])    Klebsiella aerogenes NOT DETECTED NOT DETECTED Final   Klebsiella oxytoca NOT DETECTED NOT DETECTED Final   Klebsiella pneumoniae NOT DETECTED NOT DETECTED Final   Proteus species DETECTED (A) NOT DETECTED Final    Comment: CRITICAL RESULT CALLED TO, READ BACK BY AND VERIFIED WITH: Guadlupe Spanish PharmD 10:00 11/08/19 ([email or web address])    Salmonella species NOT DETECTED NOT DETECTED Final   Serratia marcescens NOT DETECTED NOT DETECTED Final   Haemophilus influenzae NOT DETECTED NOT DETECTED Final   Neisseria meningitidis NOT DETECTED NOT DETECTED Final   Pseudomonas aeruginosa NOT DETECTED NOT DETECTED Final   Stenotrophomonas maltophilia NOT DETECTED NOT DETECTED Final   Candida albicans NOT DETECTED NOT DETECTED Final   Candida auris NOT DETECTED NOT DETECTED Final   Candida glabrata NOT DETECTED NOT DETECTED Final    Candida krusei NOT DETECTED NOT DETECTED Final   Candida parapsilosis NOT DETECTED NOT DETECTED Final   Candida tropicalis NOT DETECTED NOT DETECTED Final   Cryptococcus neoformans/gattii NOT DETECTED NOT DETECTED Final   CTX-M ESBL NOT DETECTED NOT DETECTED Final   Carbapenem resistance IMP NOT DETECTED NOT DETECTED Final   Carbapenem resistance KPC NOT DETECTED NOT DETECTED Final   Carbapenem resistance NDM NOT DETECTED NOT  DETECTED Final   Carbapenem resist OXA 48 LIKE NOT DETECTED NOT DETECTED Final   Carbapenem resistance VIM NOT DETECTED NOT DETECTED Final    Comment: Performed at [facility], [place] [address] [place], [place] [address]  Culture, blood (Routine x 2)     Status: Abnormal   Collection Time: 11/07/19  6:41 PM   Specimen: BLOOD  Result Value Ref Range Status   Specimen Description   Final    BLOOD LEFT ANTECUBITAL Performed at [facility] [address] [place], [place] [address]    Special Requests   Final    BOTTLES DRAWN AEROBIC AND ANAEROBIC Blood Culture adequate volume Performed at [facility] [address], [place], [place] [address]    Culture  Setup Time   Final    GRAM NEGATIVE RODS IN BOTH AEROBIC AND ANAEROBIC BOTTLES CRITICAL VALUE NOTED.  VALUE IS CONSISTENT WITH PREVIOUSLY REPORTED AND CALLED VALUE.    Culture (A)  Final    ESCHERICHIA COLI SUSCEPTIBILITIES PERFORMED ON PREVIOUS CULTURE WITHIN THE LAST 5 DAYS. Performed at [facility], [place] [address], [place], [place] [address]    Report Status 11/10/2019 FINAL  Final  Urine culture     Status: Abnormal   Collection Time: 11/07/19  6:45 PM   Specimen: Urine, Random  Result Value Ref Range Status   Specimen Description   Final    URINE, RANDOM Performed at [facility] [address], [place], [place] [address]    Special Requests   Final    NONE Performed at [facility], [place] [address],  [place], [place] [address]    Culture (A)  Final    70,000 COLONIES/mL ESCHERICHIA COLI >=100,000 COLONIES/mL PSEUDOMONAS AERUGINOSA    Report Status 11/10/2019 FINAL  Final   Organism ID, Bacteria ESCHERICHIA COLI (A)  Final   Organism ID, Bacteria PSEUDOMONAS AERUGINOSA (A)  Final      Susceptibility   Escherichia coli - MIC*    AMPICILLIN <=2 SENSITIVE Sensitive     CEFAZOLIN <=4 SENSITIVE Sensitive     CEFTRIAXONE <=0.25 SENSITIVE Sensitive     CIPROFLOXACIN <=0.25 SENSITIVE Sensitive     GENTAMICIN <=1 SENSITIVE Sensitive     IMIPENEM <=0.25 SENSITIVE Sensitive     NITROFURANTOIN <=16 SENSITIVE Sensitive     TRIMETH/SULFA <=20 SENSITIVE Sensitive     AMPICILLIN/SULBACTAM <=2 SENSITIVE Sensitive     PIP/TAZO <=4 SENSITIVE Sensitive     * 70,000 COLONIES/mL ESCHERICHIA COLI   Pseudomonas aeruginosa - MIC*    CEFTAZIDIME 4 SENSITIVE Sensitive     CIPROFLOXACIN 0.5 SENSITIVE Sensitive     GENTAMICIN <=1 SENSITIVE Sensitive     IMIPENEM 1 SENSITIVE Sensitive     PIP/TAZO 8 SENSITIVE Sensitive     CEFEPIME 2 SENSITIVE Sensitive     * >=100,000 COLONIES/mL PSEUDOMONAS AERUGINOSA  SARS Coronavirus 2 by RT PCR (hospital order, performed in [facility] hospital lab) Nasopharyngeal Nasopharyngeal Swab     Status: None   Collection Time: 11/07/19  7:08 PM   Specimen: Nasopharyngeal Swab  Result Value Ref Range Status   SARS Coronavirus 2 NEGATIVE NEGATIVE Final    Comment: (NOTE) SARS-CoV-2 target nucleic acids are NOT DETECTED.  The SARS-CoV-2 RNA is generally detectable in upper and lower respiratory specimens during the acute phase of infection. The lowest concentration of SARS-CoV-2 viral copies this assay can detect is 250 copies / mL. A negative result does not  preclude SARS-CoV-2 infection and should not be used as the sole basis for treatment or other patient management decisions.  A negative result may occur with improper specimen collection / handling, submission of specimen  other than nasopharyngeal swab, presence of viral mutation(s) within the areas targeted by this assay, and inadequate number of viral copies (<250 copies / mL). A negative result must be combined with clinical observations, patient history, and epidemiological information.  Fact Sheet for Patients:   StrictlyIdeas.no  Fact Sheet for Healthcare Providers: BankingDealers.co.za  This test is not yet approved or  cleared by the [place] FDA and has been authorized for detection and/or diagnosis of SARS-CoV-2 by FDA under an Emergency Use Authorization (EUA).  This EUA will remain in effect (meaning this test can be used) for the duration of the COVID-19 declaration under Section 564(b)(1) of the Act, 21 U.S.C. section 360bbb-3(b)(1), unless the authorization is terminated or revoked sooner.  Performed at [facility], [place] [address], [place], [place] [address]   MRSA PCR Screening     Status: None   Collection Time: 11/08/19  2:35 AM   Specimen: Nasal Mucosa; Nasopharyngeal  Result Value Ref Range Status   MRSA by PCR NEGATIVE NEGATIVE Final    Comment:        The GeneXpert MRSA Assay (FDA approved for NASAL specimens only), is one component of a comprehensive MRSA colonization surveillance program. It is not intended to diagnose MRSA infection nor to guide or monitor treatment for MRSA infections. Performed at [facility], [place] [address], [place], [place] [address]       Radiology Studies: CT PELVIS WO CONTRAST  Result Date: 11/14/2019 CLINICAL DATA:  Possible colovesical fistula EXAM: CT PELVIS WITHOUT CONTRAST TECHNIQUE: Multidetector CT imaging of the pelvis was performed following the standard protocol without intravenous contrast. Additionally a subsequent images were obtained following installation of dilute contrast material into the urinary bladder via the indwelling Foley  catheter. COMPARISON:  Ultrasound from 11/12/2019, CT from 11/07/2019 and recent cystoscopy films from earlier in the same day. FINDINGS: Urinary Tract: The initial images obtained through the pelvis demonstrate the Foley catheter in a relatively decompressed urinary bladder displaced anteriorly. A left ureteral stent is noted in place and there is contrast material within the right ureter related to the recent retrograde pyelogram. The bladder is significantly thickened circumferentially and a large fluid collection is noted with dependent density within which measures approximately 9.5 x 8.5 cm in greatest transverse and AP dimensions respectively. This shows considerable contrast within the posterior more solid portion with extension towards the colon as well as a considerable amount of contrast within the rectosigmoid related to the recent injection during cystoscopy. Subsequently the bladder was instilled with contrast material which distended the bladder somewhat and better delineated the walls of the bladder. Considerable trabeculation is noted. There is a rent identified in the posterior aspect of the bladder which corresponds with that seen on cystoscopy with extension of contrast material into the large abscess identified posteriorly. The more solid portion of this fluid collection may represent some hematoma within the abscess collection. There is irregularity noted posteriorly which extends towards the colon and opacifies the colon although a definitive tract between the colon and this collection is not well appreciated on this particular exam. Bowel: The rectosigmoid demonstrates contrast within from prior cystoscopic injection. There is an area with anterior to the rectum where there is loss of the normal fat plane between the known  collection posterior to the bladder and the rectum likely the site of the fistula extending to the colon. A surgical clip is noted in this region and the fat plane is more  blurred when compared with the prior CT examination from 11/07/2019. The remainder of the visualized bowel appears within normal limits. Vascular/Lymphatic: Atherosclerotic calcifications are noted without aneurysmal dilatation. Reproductive:  Prostate appears within normal limits. Other: As described above, there is a large abscess interposed between the rectum and the decompressed bladder which was seen on the prior ultrasound examination but originally thought to represent thrombus within the bladder as opposed to a posterior collection in the pelvis. Musculoskeletal: No acute bony abnormality is noted. IMPRESSION: Large air-fluid collection consistent with abscess interposed between the posterior wall of the bladder in the anterior wall of the rectum. Some solid material is noted within likely representing thrombus. 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 Left ureteral stent is noted. Critical Value/emergent results were called by telephone at the time of interpretation on 11/14/2019 at 11:37 am to Dr. Raynelle Bring , who verbally acknowledged these results. Electronically Signed   By: Inez Catalina M.D.   On: 11/14/2019 11:37   US RENAL  Result Date: 11/12/2019 CLINICAL DATA:  Hydronephrosis EXAM: RENAL / URINARY TRACT ULTRASOUND COMPLETE COMPARISON:  CT 11/07/2019 FINDINGS: Right Kidney: Renal measurements: 9.3 x 5.2 x 6.3 cm = volume: 160 mL . Echogenicity within normal limits. No mass or shadowing stone visualized. Mild hydronephrosis. Left Kidney: Renal measurements: 10.9 x 6.7 x 7.5 cm = volume: 284 mL. Echogenicity within normal limits. No mass or shadowing stone visualized. Mild hydronephrosis. Bladder: There is a large heterogeneous intraluminal mass within the urinary bladder  measuring approximately 9.4 x 5.0 x 5.2 cm. No internal vascularity is seen within the mass with color Doppler. Other: Small volume ascites incidentally noted. IMPRESSION: 1. Large avascular heterogeneous intraluminal mass within the urinary bladder measuring up to 9.4 cm. Findings are most compatible with a large intraluminal blood clot in the setting of hematuria. Follow-up to resolution is recommended to exclude the presence of an underlying malignancy. 2. Mild bilateral hydronephrosis. 3. Small volume ascites. Electronically Signed   By: Davina Poke D.O.   On: 11/12/2019 15:28   DG C-Arm 1-60 Min-No Report  Result Date: 11/14/2019 Fluoroscopy was utilized by the requesting physician.  No radiographic interpretation.     LOS: 6 days   Antonieta Pert, MD [facility]  11/14/2019, 1:04 PM

## 2019-11-14 NOTE — Transfer of Care (Signed)
Immediate Anesthesia Transfer of Care Note  Patient: CORNELIS KLUVER Sr  Procedure(s) Performed: Procedure(s): CYSTOSCOPY WITH RETROGRADE PYELOGRAM bilateral Wyvonnia Dusky STENT PLACEMENT left fulguration bladder . cystogram (N/A)  Patient Location: PACU  Anesthesia Type:General  Level of Consciousness:  sedated, patient cooperative and responds to stimulation  Airway & Oxygen Therapy:Patient Spontanous Breathing and Patient connected to face mask oxgen  Post-op Assessment:  Report given to PACU RN and Post -op Vital signs reviewed and stable  Post vital signs:  Reviewed and stable  Last Vitals:  Vitals:   11/13/19 2003 11/14/19 0511  BP: 112/67 107/68  Pulse: 79 (!) 58  Resp: 18 18  Temp: 36.7 C 36.8 C  SpO2: 67% 89%    Complications: No apparent anesthesia complications

## 2019-11-15 ENCOUNTER — Encounter (HOSPITAL_COMMUNITY): Payer: Self-pay | Admitting: Urology

## 2019-11-15 ENCOUNTER — Inpatient Hospital Stay (HOSPITAL_COMMUNITY): Payer: No Typology Code available for payment source

## 2019-11-15 DIAGNOSIS — B9620 Unspecified Escherichia coli [E. coli] as the cause of diseases classified elsewhere: Secondary | ICD-10-CM

## 2019-11-15 DIAGNOSIS — M5412 Radiculopathy, cervical region: Secondary | ICD-10-CM | POA: Diagnosis present

## 2019-11-15 DIAGNOSIS — N321 Vesicointestinal fistula: Secondary | ICD-10-CM

## 2019-11-15 DIAGNOSIS — N390 Urinary tract infection, site not specified: Secondary | ICD-10-CM

## 2019-11-15 DIAGNOSIS — K651 Peritoneal abscess: Secondary | ICD-10-CM

## 2019-11-15 DIAGNOSIS — F4310 Post-traumatic stress disorder, unspecified: Secondary | ICD-10-CM | POA: Diagnosis present

## 2019-11-15 DIAGNOSIS — Z77098 Contact with and (suspected) exposure to other hazardous, chiefly nonmedicinal, chemicals: Secondary | ICD-10-CM | POA: Diagnosis present

## 2019-11-15 HISTORY — DX: Peritoneal abscess: K65.1

## 2019-11-15 HISTORY — DX: Urinary tract infection, site not specified: B96.20

## 2019-11-15 LAB — BASIC METABOLIC PANEL
Anion gap: 7 (ref 5–15)
BUN: 43 mg/dL — ABNORMAL HIGH (ref 6–20)
CO2: 20 mmol/L — ABNORMAL LOW (ref 22–32)
Calcium: 7.3 mg/dL — ABNORMAL LOW (ref 8.9–10.3)
Chloride: 108 mmol/L (ref 98–111)
Creatinine, Ser: 2.53 mg/dL — ABNORMAL HIGH (ref 0.61–1.24)
GFR calc Af Amer: 32 mL/min — ABNORMAL LOW (ref >60)
GFR calc non Af Amer: 27 mL/min — ABNORMAL LOW (ref >60)
Glucose, Bld: 89 mg/dL (ref 70–99)
Potassium: 4.7 mmol/L (ref 3.5–5.1)
Sodium: 135 mmol/L (ref 135–145)

## 2019-11-15 LAB — CBC
HCT: 23.9 % — ABNORMAL LOW (ref 39.0–52.0)
Hemoglobin: 7.3 g/dL — ABNORMAL LOW (ref 13.0–17.0)
MCH: 29.7 pg (ref 26.0–34.0)
MCHC: 30.5 g/dL (ref 30.0–36.0)
MCV: 97.2 fL (ref 80.0–100.0)
Platelets: 126 10*3/uL — ABNORMAL LOW (ref 150–400)
RBC: 2.46 MIL/uL — ABNORMAL LOW (ref 4.22–5.81)
RDW: 19.4 % — ABNORMAL HIGH (ref 11.5–15.5)
WBC: 8.1 10*3/uL (ref 4.0–10.5)
nRBC: 0 % (ref 0.0–0.2)

## 2019-11-15 LAB — PREALBUMIN: Prealbumin: 18 mg/dL (ref 18–38)

## 2019-11-15 LAB — VITAMIN B12: Vitamin B-12: 298 pg/mL (ref 180–914)

## 2019-11-15 LAB — GLUCOSE, CAPILLARY
Glucose-Capillary: 87 mg/dL (ref 70–99)
Glucose-Capillary: 77 mg/dL (ref 70–99)
Glucose-Capillary: 80 mg/dL (ref 70–99)
Glucose-Capillary: 158 mg/dL — ABNORMAL HIGH (ref 70–99)

## 2019-11-15 LAB — RETICULOCYTES
Immature Retic Fract: 36.8 % — ABNORMAL HIGH (ref 2.3–15.9)
RBC.: 2.45 MIL/uL — ABNORMAL LOW (ref 4.22–5.81)
Retic Count, Absolute: 56.6 10*3/uL (ref 19.0–186.0)
Retic Ct Pct: 2.3 % (ref 0.4–3.1)

## 2019-11-15 LAB — IRON AND TIBC
Iron: 36 ug/dL — ABNORMAL LOW (ref 45–182)
Saturation Ratios: 19 % (ref 17.9–39.5)
TIBC: 191 ug/dL — ABNORMAL LOW (ref 250–450)
UIBC: 155 ug/dL

## 2019-11-15 LAB — FERRITIN: Ferritin: 103 ng/mL (ref 24–336)

## 2019-11-15 LAB — FOLATE: Folate: 3.2 ng/mL — ABNORMAL LOW (ref >5.9)

## 2019-11-15 LAB — MAGNESIUM: Magnesium: 1.9 mg/dL (ref 1.7–2.4)

## 2019-11-15 MED ORDER — ACETAMINOPHEN 325 MG PO TABS: 650.0000 mg | ORAL_TABLET | Freq: Four times a day (QID) | ORAL | Status: DC | PRN

## 2019-11-15 MED ORDER — MIDAZOLAM HCL 2 MG/2ML IJ SOLN
INTRAMUSCULAR | Status: AC
  Filled 2019-11-15: qty 4

## 2019-11-15 MED ORDER — FENTANYL CITRATE (PF) 100 MCG/2ML IJ SOLN
INTRAMUSCULAR | Status: AC
  Filled 2019-11-15: qty 4

## 2019-11-15 MED ORDER — FENTANYL CITRATE (PF) 100 MCG/2ML IJ SOLN
INTRAMUSCULAR | Status: AC | PRN
  Administered 2019-11-15 (×2): 50 ug via INTRAVENOUS

## 2019-11-15 MED ORDER — FENTANYL CITRATE (PF) 100 MCG/2ML IJ SOLN
12.5000 ug | Freq: Once | INTRAMUSCULAR | Status: AC
  Administered 2019-11-16: 12.5 ug via INTRAVENOUS
  Filled 2019-11-15: qty 2

## 2019-11-15 MED ORDER — SODIUM CHLORIDE 0.9 % IV SOLN
INTRAVENOUS | Status: AC
  Filled 2019-11-15: qty 250

## 2019-11-15 MED ORDER — MIDAZOLAM HCL 2 MG/2ML IJ SOLN
INTRAMUSCULAR | Status: AC | PRN
  Administered 2019-11-15 (×2): 1 mg via INTRAVENOUS

## 2019-11-15 NOTE — H&P (Addendum)
Chief Complaint: Patient was seen in consultation today for  Chief Complaint  Patient presents with  . Code Sepsis    Referring Physician(s): Dr. Alinda Money   Supervising Physician: Corrie Mckusick  Patient Status: [facility] - In-pt  History of Present Illness: Chase [name] is a 54 y.o. male with a medical history that includes CKD, stroke with left-sided weakness (July 2021), failure to thrive, Hodgkin's lymphoma and BPH. He has had an indwelling foley for approximately one month, placed at [facility] in [place] for urinary retention. He reportedly had a cystoscopy performed by Urology and the patient was told he had a possible mass in the back of his bladder.   He was transported to the ED from his SNF for fevers, nausea, diarrhea and hypoxia. He had an AKI and was hypotensive and started on Levophed. CT imaging showed bilateral hydronephrosis. He was admitted for septic shock with GNR bacteremia with a likely urinary source. Urology was consulted for hematuria.    He was taken to the OR 11/14/19 for cystoscopy, cystogram, bilateral retrograde pyelography, left ureteral stent placement and fulguration of the bladder. Findings: hematuria, probable colovesical fistula and bilateral hydronephrosis with probable obstruction. The right ureter was too tortuous to place a retrograde right ureteral stent.   CT pelvis 11/14/19: IMPRESSION: Large air-fluid collection consistent with abscess interposed between the posterior wall of the bladder in the anterior wall of the rectum. Some solid material is noted within likely representing thrombus. 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 Left ureteral stent is noted.  Interventional Radiology has been asked to evaluate this  patient for a right percutaneous nephrostomy and an abdominal fluid collection aspiration with drain placement. This case has been reviewed by and procedures approved by Dr. Earleen Newport.   Past Medical History:  Diagnosis Date  . Cervical radiculopathy    with left sided weakness  . Chemical exposure    service related injury  . Chronic pain   . GIB (gastrointestinal bleeding)   . PTSD (post-traumatic stress disorder)   . Renal disorder    CKD  . Urinary retention    due to bladder outlet obstruction    Past Surgical History:  Procedure Laterality Date  . CYSTOSCOPY W/ URETERAL STENT PLACEMENT N/A 11/14/2019   Procedure: CYSTOSCOPY WITH RETROGRADE PYELOGRAM bilateral Wyvonnia Dusky STENT PLACEMENT left fulguration bladder . cystogram;  Surgeon: Raynelle Bring, MD;  Location: [facility]S;  Service: Urology;  Laterality: N/A;  . GASTRIC BYPASS    . HERNIA REPAIR      Allergies: Nsaids  Medications: Prior to Admission medications   Medication Sig Start Date End Date Taking? Authorizing Provider  cyanocobalamin (,VITAMIN B-12,) 1000 MCG/ML injection Inject 1,000 mcg into the muscle every 30 (thirty) days.  12/28/13  Yes [provider]  DULoxetine (CYMBALTA) 30 MG capsule Take 30-60 mg by mouth See admin instructions. Take 2 capsules in the morning and 1 capsule in the evening 08/12/17  Yes [provider]  feeding supplement, ENSURE ENLIVE, (ENSURE ENLIVE) LIQD Take 237 mLs by mouth 3 (three) times daily between meals. 10/26/19  Yes Mikhail, [place], DO  ferrous sulfate 325 (65 FE) MG tablet Take 325 mg by mouth daily.   Yes [provider]  gabapentin (NEURONTIN) 300 MG capsule Take 1  capsule (300 mg total) by mouth 2 (two) times daily. 10/26/19  Yes Mikhail, [place], DO  HYDROcodone-acetaminophen (NORCO/VICODIN) 5-325 MG tablet Take 1 tablet by mouth every 4 (four) hours as needed (pain).   Yes [provider]  hydrOXYzine (ATARAX/VISTARIL) 10 MG tablet Take 1  tablet (10 mg total) by mouth 3 (three) times daily as needed for itching or anxiety. 10/26/19  Yes Mikhail, Maryann, DO  midodrine (PROAMATINE) 2.5 MG tablet Take 1 tablet (2.5 mg total) by mouth 3 (three) times daily. 10/26/19  Yes Mikhail, [place], DO  Multiple Vitamin (MULTIVITAMIN WITH MINERALS) TABS tablet Take 1 tablet by mouth daily. 10/27/19  Yes Mikhail, Maryann, DO  pantoprazole (PROTONIX) 40 MG tablet Take 40 mg by mouth 2 (two) times daily.  06/09/18  Yes [provider]  promethazine (PHENERGAN) 25 MG tablet Take 25 mg by mouth daily as needed for nausea or vomiting.   Yes [provider]  sodium bicarbonate 650 MG tablet Take 3 tablets (1,950 mg total) by mouth 3 (three) times daily. 10/26/19  Yes Mikhail, Velta [place], DO  tamsulosin (FLOMAX) 0.4 MG CAPS capsule Take 0.4 mg by mouth daily.    Yes [provider]  aspirin EC 81 MG EC tablet Take 1 tablet (81 mg total) by mouth daily. Swallow whole. Patient not taking: Reported on 11/07/2019 10/27/19   Cristal Ford, DO  traMADol (ULTRAM) 50 MG tablet Take 1 tablet (50 mg total) by mouth every 6 (six) hours as needed. Patient not taking: Reported on 11/07/2019 10/26/19   Cristal Ford, DO     Family History  Problem Relation Age of Onset  . Renal cancer Father   . Heart disease Paternal Grandfather     Social History   Socioeconomic History  . Marital status: Widowed    Spouse name: Not on file  . Number of children: Not on file  . Years of education: Not on file  . Highest education level: Not on file  Occupational History  . Not on file  Tobacco Use  . Smoking status: Never Smoker  . Smokeless tobacco: Current User  Vaping Use  . Vaping Use: Never used  Substance and Sexual Activity  . Alcohol use: Never  . Drug use: Never  . Sexual activity: Not on file  Other Topics Concern  . Not on file  Social History Narrative  . Not on file   Social Determinants of Health   Financial Resource  Strain:   . Difficulty of Paying Living Expenses:   Food Insecurity:   . Worried About Charity fundraiser in the Last Year:   . Arboriculturist in the Last Year:   Transportation Needs:   . Film/video editor (Medical):   Marland Kitchen Lack of Transportation (Non-Medical):   Physical Activity:   . Days of Exercise per Week:   . Minutes of Exercise per Session:   Stress:   . Feeling of Stress :   Social Connections:   . Frequency of Communication with Friends and Family:   . Frequency of Social Gatherings with Friends and Family:   . Attends Religious Services:   . Active Member of Clubs or Organizations:   . Attends Archivist Meetings:   Marland Kitchen Marital Status:     Review of Systems: A 12 point ROS discussed and pertinent positives are indicated in the HPI above.  All other systems are negative.  Review of Systems  Constitutional: Positive for fatigue.  Respiratory: Negative for cough  and shortness of breath.   Cardiovascular: Negative for chest pain and leg swelling.  Gastrointestinal: Positive for abdominal pain.  Genitourinary: Positive for hematuria.  Musculoskeletal: Positive for back pain.  Neurological: Negative for headaches.    Vital Signs: BP 112/72 (BP Location: Right Arm)   Pulse (!) 59   Temp 98.3 F (36.8 C) (Oral)   Resp 18   Ht 5\' 8"  (1.727 m)   Wt 120 lb 13 oz (54.8 kg)   SpO2 100%   BMI 18.37 kg/m   Physical Exam Constitutional:      Comments: cachectic  HENT:     Mouth/Throat:     Mouth: Mucous membranes are moist.     Pharynx: Oropharynx is clear.  Cardiovascular:     Rate and Rhythm: Normal rate and regular rhythm.     Pulses: Normal pulses.     Heart sounds: Normal heart sounds.  Pulmonary:     Effort: Pulmonary effort is normal.     Breath sounds: Normal breath sounds.  Abdominal:     General: Bowel sounds are normal.     Palpations: Abdomen is soft.  Genitourinary:    Comments: Foley catheter; hematuria Skin:    General: Skin is  warm and dry.  Neurological:     Mental Status: He is alert and oriented to person, place, and time.     Imaging: CT ABDOMEN PELVIS WO CONTRAST  Result Date: 11/07/2019 CLINICAL DATA:  54 year old male with sepsis. Fever of 103 today. EXAM: CT ABDOMEN AND PELVIS WITHOUT CONTRAST TECHNIQUE: Multidetector CT imaging of the abdomen and pelvis was performed following the standard protocol without IV contrast. COMPARISON:  Noncontrast chest CT today reported separately. Chest CTA 10/22/2019. FINDINGS: Lower chest: Reported separately today. Hepatobiliary: Absent gallbladder.  Negative noncontrast liver. Pancreas: Negative noncontrast pancreas. Spleen: Negative. Adrenals/Urinary Tract: Mild bilateral adrenal gland thickening. Mild to moderate bilateral hydronephrosis. Hydroureter is more apparent on the right. Punctate right lower pole nephrolithiasis. No ureteral calculus identified. The bladder is abnormal with circumferential wall thickening, and best seen on sagittal image 95 with narrow windows there seems to be either a central bladder septation, or some kind of superimposed fluid collection superimposed on the bladder dome. The Foley terminates in the more caudal area. There is more complex urine density in the more dorsal/superior component (18 Hounsfield units). There is a surgical clip within or along the posterior bladder wall, and there are also multiple surgical clips in the space of Retzius including across midline to the right. Foley catheter associated gas is primarily located within the lower section, although a punctate focus of gas might be within the septation as seen on series 6, image 71. The ureterovesical junctions cannot be confidently delineated. Stomach/Bowel: Retained gas and stool throughout the large bowel. The sigmoid colon and both colonic flexures are at the upper limits of normal. The right colon is more decompressed. Possible normal appendix on series 2, image 70. No pericecal  inflammation. No superimposed dilated small bowel. A small volume of oral contrast is present in the proximal jejunum. Evidence of chronic gastrojejunostomy, distal gastrectomy. The mid and distal duodenum remain in place. No free air or free fluid. Vascular/Lymphatic: Vascular patency is not evaluated in the absence of IV contrast. Mild Aortoiliac calcified atherosclerosis. No lymphadenopathy. Streak Reproductive: Urethral catheter in place. Chronic postoperative changes to the left inguinal region and the space of Retzius in the midline with numerous surgical clips. Other: No pelvic free fluid. Musculoskeletal: Bilateral lower rib fractures which  were present last month and are reported separately today. Lumbar vertebrae appear intact. Sacrum, SI joints and coccygeal segments appear intact. Pelvis and proximal femurs are intact. No discrete sacral decubitus ulcer is evident. IMPRESSION: 1. Bilateral hydronephrosis with abnormal urinary bladder wall thickening suggesting UTI, but also there is either a thick central septation which compartmentalizes the bladder into two regions, or an equal sized complex fluid collection adherent to the bladder dome. See sagittal image 95. Follow-up Pelvis CT Cystogram (CT of the pelvis after dilute contrast is instilled into the urinary bladder via the Foley catheter) would be valuable to determine whether the two fluid spaces communicate. 2. No other acute or inflammatory process in the non-contrast abdomen or pelvis. Gas and stool in the large bowel suggests either constipation or decreased colonic motility. Sequelae of gastrojejunostomy. Electronically Signed   By: Genevie Ann M.D.   On: 11/07/2019 23:22   CT Angio Head W or Wo Contrast  Result Date: 10/17/2019 CLINICAL DATA:  [name] and slurred speech EXAM: CT ANGIOGRAPHY HEAD AND NECK TECHNIQUE: Multidetector CT imaging of the head and neck was performed using the standard protocol during bolus administration of  intravenous contrast. Multiplanar CT image reconstructions and MIPs were obtained to evaluate the vascular anatomy. Carotid stenosis measurements (when applicable) are obtained utilizing NASCET criteria, using the distal internal carotid diameter as the denominator. CONTRAST:  63mL OMNIPAQUE IOHEXOL 350 MG/ML SOLN COMPARISON:  None. FINDINGS: CTA NECK Aortic arch: Great vessel origins are incompletely included. Visualized arch is unremarkable. Right carotid system: Patent. There is no measurable stenosis at the ICA origin. Left carotid system: Common carotid origin is not included. Patent. Trace calcified plaque at the ICA origin without measurable stenosis. Vertebral arteries: Patent. Left vertebral artery is slightly dominant. Skeleton: Evidence of prior anterior fusion at C3-C4 with plate and screw fixation and well incorporated interbody graft. Degenerative changes of the cervical spine. Other neck: No mass or adenopathy. Upper chest: Included upper lungs are clear. Review of the MIP images confirms the above findings CTA HEAD Anterior circulation: Intracranial internal carotid arteries are patent. Anterior cerebral arteries are patent. 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 Middle cerebral arteries are patent. Posterior circulation: Intracranial vertebral arteries, basilar artery, and posterior cerebral arteries are patent. Venous sinuses: Patent as allowed by contrast bolus timing. Review of the MIP images confirms the above findings IMPRESSION: No large vessel occlusion or hemodynamically significant stenosis. Electronically Signed   By: Macy Mis M.D.   On: 10/17/2019 13:15   DG Chest 1 View  Result Date: 10/20/2019 CLINICAL DATA:  Elevated D-dimer. History of chemical exposure, GI bleeding. EXAM: CHEST  1 VIEW COMPARISON:  10/17/2019 FINDINGS: Shallow lung inflation. Heart size is normal. There are no focal consolidations or pleural effusions. IMPRESSION: Shallow inflation.  No evidence for acute   abnormality. Electronically Signed   By: Nolon Nations M.D.   On: 10/20/2019 18:44   CT HEAD WO CONTRAST  Result Date: 10/18/2019 CLINICAL DATA:  Follow-up stroke. [name] and numbness. Scattered small infarctions by MRI. EXAM: CT HEAD WITHOUT CONTRAST TECHNIQUE: Contiguous axial images were obtained from the base of the skull through the vertex without intravenous contrast. COMPARISON:  10/17/2019 FINDINGS: Brain: No acute finding affecting the brainstem or cerebellum by CT. Cerebral hemispheres show generalized atrophy, somewhat premature for age. Subtle evidence cortical infarction in the right occipital lobe. No mass effect or hemorrhage. Other small infarctions seen by MRI not visible by CT. No hemorrhage, hydrocephalus or extra-axial collection.  Vascular: There is atherosclerotic calcification of the major vessels at the base of the brain. Skull: Negative Sinuses/Orbits: Clear/normal Other: None IMPRESSION: Subtle evidence of cortical infarction in the right occipital region. No mass effect or hemorrhage. None of the other acute infarctions visible by MRI are visible on CT. Generalized volume loss, somewhat premature for age. Electronically Signed   By: Nelson Chimes M.D.   On: 10/18/2019 13:24   CT Chest Wo Contrast  Result Date: 11/07/2019 CLINICAL DATA:  54 year old male with sepsis.  Fever of 103 today. EXAM: CT CHEST WITHOUT CONTRAST TECHNIQUE: Multidetector CT imaging of the chest was performed following the standard protocol without IV contrast. COMPARISON:  Portable chest earlier today. CT Abdomen and Pelvis today reported separately. Prior chest CTA 10/22/2019. FINDINGS: Cardiovascular: No cardiomegaly or pericardial effusion. Vascular patency is not evaluated in the absence of IV contrast. Mediastinum/Nodes: Negative, no lymphadenopathy is evident. Lungs/Pleura: Bilateral pleural effusions appear largely resolved since last month. Major airways are patent. Improved bilateral  lower lobe ventilation. There is only mild linear atelectasis or scarring in the lateral basal segment of the left lower lobe. There is a tiny calcified granuloma in the right lower lobe. Upper Abdomen: Reported separately today. Musculoskeletal: Stable thoracic spine degeneration. Mildly displaced fractures of the posterior right 9th rib and right 10th rib costovertebral junction are redemonstrated. The left lateral 7th through 9th rib fractures remain largely nondisplaced. No new osseous abnormality identified. IMPRESSION: 1. No acute or inflammatory process in the non-contrast chest. Bilateral pleural effusions have resolved since last month. 2. Bilateral lower rib fractures, stable from last month. 3. CT Abdomen and Pelvis today reported separately. Electronically Signed   By: Genevie Ann M.D.   On: 11/07/2019 22:55   CT ANGIO CHEST PE W OR WO CONTRAST  Result Date: 10/22/2019 CLINICAL DATA:  Masslike structure around right pulmonary artery during echocardiography, positive D-dimer EXAM: CT ANGIOGRAPHY CHEST WITH CONTRAST TECHNIQUE: Multidetector CT imaging of the chest was performed using the standard protocol during bolus administration of intravenous contrast. Multiplanar CT image reconstructions and MIPs were obtained to evaluate the vascular anatomy. CONTRAST:  105mL OMNIPAQUE IOHEXOL 350 MG/ML SOLN COMPARISON:  10/20/2019 FINDINGS: Cardiovascular: This is a technically adequate evaluation of the pulmonary vasculature. There are no filling defects or pulmonary emboli. The pulmonary artery is normal in caliber. I do not see any abnormalities associated with the right pulmonary artery to explain the reported echocardiography findings. There is a trace pericardial effusion. Evaluation of the thoracic aorta is limited due to the timing of contrast bolus. No evidence of thoracic aortic aneurysm. Mediastinum/Nodes: No enlarged mediastinal, hilar, or axillary lymph nodes. Thyroid gland, trachea, and esophagus  demonstrate no significant findings. Lungs/Pleura: There are small bilateral pleural effusions, volume estimated less than 500 cc each. Dependent lower lobe atelectasis. No airspace disease or pneumothorax. The central airways are patent. Upper Abdomen: Trace ascites is noted throughout the upper abdomen. Colonic interposition right upper quadrant. Postsurgical changes from previous gastric surgery. Musculoskeletal: The patient is cachectic. There are nondisplaced right posterolateral eighth and ninth rib fractures. There are nondisplaced left anterolateral seventh and eighth rib fractures. Reconstructed images demonstrate no additional findings. Review of the MIP images confirms the above findings. IMPRESSION: 1. No evidence of pulmonary embolus. I do not see any abnormalities associated with the right pulmonary artery to explain the reported echocardiography findings. 2. Small bilateral pleural effusions. 3. Trace ascites. 4. Nondisplaced bilateral rib fractures. Electronically Signed   By: Diana Eves.D.  On: 10/22/2019 00:37   CT PELVIS WO CONTRAST  Result Date: 11/14/2019 CLINICAL DATA:  Possible colovesical fistula EXAM: CT PELVIS WITHOUT CONTRAST TECHNIQUE: Multidetector CT imaging of the pelvis was performed following the standard protocol without intravenous contrast. Additionally a subsequent images were obtained following installation of dilute contrast material into the urinary bladder via the indwelling Foley catheter. COMPARISON:  Ultrasound from 11/12/2019, CT from 11/07/2019 and recent cystoscopy films from earlier in the same day. FINDINGS: Urinary Tract: The initial images obtained through the pelvis demonstrate the Foley catheter in a relatively decompressed urinary bladder displaced anteriorly. A left ureteral stent is noted in place and there is contrast material within the right ureter related to the recent retrograde pyelogram. The bladder is significantly thickened circumferentially  and a large fluid collection is noted with dependent density within which measures approximately 9.5 x 8.5 cm in greatest transverse and AP dimensions respectively. This shows considerable contrast within the posterior more solid portion with extension towards the colon as well as a considerable amount of contrast within the rectosigmoid related to the recent injection during cystoscopy. Subsequently the bladder was instilled with contrast material which distended the bladder somewhat and better delineated the walls of the bladder. Considerable trabeculation is noted. There is a rent identified in the posterior aspect of the bladder which corresponds with that seen on cystoscopy with extension of contrast material into the large abscess identified posteriorly. The more solid portion of this fluid collection may represent some hematoma within the abscess collection. There is irregularity noted posteriorly which extends towards the colon and opacifies the colon although a definitive tract between the colon and this collection is not well appreciated on this particular exam. Bowel: The rectosigmoid demonstrates contrast within from prior cystoscopic injection. There is an area with anterior to the rectum where there is loss of the normal fat plane between the known collection posterior to the bladder and the rectum likely the site of the fistula extending to the colon. A surgical clip is noted in this region and the fat plane is more blurred when compared with the prior CT examination from 11/07/2019. The remainder of the visualized bowel appears within normal limits. Vascular/Lymphatic: Atherosclerotic calcifications are noted without aneurysmal dilatation. Reproductive:  Prostate appears within normal limits. Other: As described above, there is a large abscess interposed between the rectum and the decompressed bladder which was seen on the prior ultrasound examination but originally thought to represent thrombus  within the bladder as opposed to a posterior collection in the pelvis. Musculoskeletal: No acute bony abnormality is noted. IMPRESSION: Large air-fluid collection consistent with abscess interposed between the posterior wall of the bladder in the anterior wall of the rectum. Some solid material is noted within likely representing thrombus. 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 Left ureteral stent is noted. Critical Value/emergent results were called by telephone at the time of interpretation on 11/14/2019 at 11:37 am to Dr. Raynelle Bring , who verbally acknowledged these results. Electronically Signed   By: Inez Catalina M.D.   On: 11/14/2019 11:37   MR BRAIN WO CONTRAST  Result Date: 10/18/2019 CLINICAL DATA:  Stroke follow-up.  [name] and numbness. EXAM: MRI HEAD WITHOUT CONTRAST TECHNIQUE: Multiplanar, multiecho pulse sequences of the brain and surrounding structures were obtained  without intravenous contrast. COMPARISON:  Head CT and CTA 10/17/2019 FINDINGS: Brain: There are subcentimeter acute infarcts in the cerebellum bilaterally. There is a small acute cortical infarct in the posteromedial right occipital lobe. There are multiple subcentimeter acute infarcts in the right corpus callosum, right cingulate gyrus, and right greater than left frontal lobes. No intracranial hemorrhage, mass, midline shift, or extra-axial fluid collection is identified. The ventricles and sulci are normal. Vascular: Major intracranial vascular flow voids are preserved. Skull and upper cervical spine: No suspicious marrow lesion. Anterior fusion at C3-4. Sinuses/Orbits: Unremarkable orbits. Clear paranasal sinuses. Small right mastoid effusion. Other: None. IMPRESSION: Small bilateral acute  cerebral and cerebellar infarcts suggesting a central embolic source. Electronically Signed   By: Logan Bores M.D.   On: 10/18/2019 07:34   NM Pulmonary Perfusion  Result Date: 10/20/2019 CLINICAL DATA:  54 year old male with concern for pulmonary embolism. Positive D-dimer. EXAM: NUCLEAR MEDICINE PERFUSION LUNG SCAN TECHNIQUE: Perfusion images were obtained in multiple projections after intravenous injection of radiopharmaceutical. Ventilation scans intentionally deferred if perfusion scan and chest x-ray adequate for interpretation during COVID 19 epidemic. RADIOPHARMACEUTICALS:  4.2 mCi Tc-27m MAA IV COMPARISON:  Chest radiograph dated 10/20/2019. FINDINGS: There is homogeneous perfusion of the lungs. No focal defect identified. IMPRESSION: Normal perfusion scan. Electronically Signed   By: Anner Crete M.D.   On: 10/20/2019 18:41   US Abdomen Complete  Result Date: 10/19/2019 CLINICAL DATA:  Thrombocytopenia.  Chronic renal disease. EXAM: ABDOMEN ULTRASOUND COMPLETE COMPARISON:  No prior. FINDINGS: Gallbladder: Not visualized. Common bile duct: Diameter: Not visualized. Liver: Left lobe not visualized. Heterogeneous parenchymal pattern where visualized. No focal hepatic mass identified. Portal vein is patent on color Doppler imaging with normal direction of blood flow towards the liver. IVC: Not visualized. Pancreas: Visualized portion unremarkable. Spleen: Size and appearance within normal limits. Spleen measures 11.2 cm. Right Kidney: Length: 9.5 cm. Increased echogenicity consistent chronic medical renal disease. Mild right hydronephrosis. No mass noted. Left Kidney: Length: 10.4 cm. Increased echogenicity consistent chronic medical renal disease. Mild left hydronephrosis. No mass noted. Small amount of perinephric fluid. Abdominal aorta: Poorly visualized. Other findings: Ascites and bilateral pleural effusions noted. This was an extremely limited exam due to overlying bowel gas. IMPRESSION: 1.  Extremely limited exam due to overlying bowel gas. Gallbladder and common bile duct not visualized. Left hepatic lobe not visualized. 2. Liver has a heterogeneous parenchymal pattern suggesting the possibility of fatty infiltration or hepatocellular disease. Spleen is normal in size. 3. Increased echogenicity both kidneys consistent chronic medical renal disease. Mild bilateral hydronephrosis. Small amount of left perinephric fluid noted. 4.  Ascites and bilateral pleural effusions noted. Electronically Signed   By: Marcello Moores  Register   On: 10/19/2019 13:43   US RENAL  Result Date: 11/12/2019 CLINICAL DATA:  Hydronephrosis EXAM: RENAL / URINARY TRACT ULTRASOUND COMPLETE COMPARISON:  CT 11/07/2019 FINDINGS: Right Kidney: Renal measurements: 9.3 x 5.2 x 6.3 cm = volume: 160 mL . Echogenicity within normal limits. No mass or shadowing stone visualized. Mild hydronephrosis. Left Kidney: Renal measurements: 10.9 x 6.7 x 7.5 cm = volume: 284 mL. Echogenicity within normal limits. No mass or shadowing stone visualized. Mild hydronephrosis. Bladder: There is a large heterogeneous intraluminal mass within the urinary bladder measuring approximately 9.4 x 5.0 x 5.2 cm. No internal vascularity is seen within the mass with color Doppler. Other: Small volume ascites incidentally noted. IMPRESSION: 1. Large avascular heterogeneous intraluminal mass within the urinary bladder measuring up to 9.4 cm. Findings are  most compatible with a large intraluminal blood clot in the setting of hematuria. Follow-up to resolution is recommended to exclude the presence of an underlying malignancy. 2. Mild bilateral hydronephrosis. 3. Small volume ascites. Electronically Signed   By: Davina Poke D.O.   On: 11/12/2019 15:28   DG CHEST PORT 1 VIEW  Result Date: 11/08/2019 CLINICAL DATA:  Sepsis EXAM: PORTABLE CHEST 1 VIEW COMPARISON:  November 07, 2019 FINDINGS: The heart size and mediastinal contours are within normal limits. Interval  placement of a right-sided central venous catheter with the tip at the superior cavoatrial junction. Both lungs are clear. The visualized skeletal structures are unremarkable. IMPRESSION: No active disease. Right-sided central venous catheter with the tip at the superior cavoatrial junction. Electronically Signed   By: Prudencio Pair M.D.   On: 11/08/2019 02:09   DG Chest Portable 1 View  Result Date: 11/07/2019 CLINICAL DATA:  54 year old male with sepsis, fever of 103 today. 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 EXAM: PORTABLE CHEST 1 VIEW COMPARISON:  CTA chest 10/22/2019 and earlier. FINDINGS: Portable AP semi upright view at 1912 hours. Stable somewhat low lung volumes. No pleural effusion is evident today. Allowing for portable technique the lungs are clear. Mediastinal contours remain normal. Visualized tracheal air column is within normal limits. Negative visible bowel gas pattern. Stable surgical clips in the right upper quadrant. No acute osseous abnormality identified. IMPRESSION: No acute cardiopulmonary abnormality. Electronically Signed   By: Genevie Ann M.D.   On: 11/07/2019 19:23   DG Chest Portable 1 View  Result Date: 10/17/2019 CLINICAL DATA:  Weakness, Reports went to bed last night today awakened and fell to the left sitting up - then fell out of bed In ED makes sffort to raise L arm, but cannot Cannot raise either R or L leg EXAM: PORTABLE CHEST 1 VIEW COMPARISON:  None. FINDINGS: The heart size and mediastinal contours are within normal limits. Low lung volumes. The lungs are clear. No pneumothorax or significant pleural effusion. No acute finding in the visualized skeleton. Prominent distended loops of bowel in the abdomen. IMPRESSION: No acute cardiopulmonary finding. Electronically Signed   By: Audie Pinto M.D.   On: 10/17/2019 13:59   DG C-Arm 1-60 Min-No Report  Result Date: 11/14/2019 Fluoroscopy was utilized by the requesting physician.  No radiographic  interpretation.   ECHOCARDIOGRAM COMPLETE  Result Date: 10/19/2019    ECHOCARDIOGRAM REPORT   Patient Name:   Chase [name] Date of Exam: 10/19/2019 Medical Rec #:  [id number]          Height:       68.0 in Accession #:    [id number]         Weight:       122.0 lb Date of Birth:  26-Sep-1965          BSA:          1.657 m Patient Age:    44 years           BP:           117/78 mmHg Patient Gender: M                  HR:           64 bpm. Exam Location:  Inpatient Procedure: 2D Echo, Cardiac Doppler and Color Doppler Indications:    Stroke  History:        Patient has no prior history of Echocardiogram examinations.  Stroke. Elevated troponin.  Sonographer:    Roseanna Rainbow RDCS Referring Phys: [id number] RIPUDEEP K RAI  Sonographer Comments: Technically difficult study due to poor echo windows and no subcostal window. Difficult due to very thin habitus. Very low parasternal. No true subcostal view and could not visualize IVC. IMPRESSIONS  1. There is a mass like structure noted in the right pulmonary artery. This is seen on the suprasternal notch views. Unable to exclude pulmonary embolism. Given elevated troponin, would consider CT PE study to exclude PE.  2. Left ventricular ejection fraction, by estimation, is 60 to 65%. The left ventricle has normal function. The left ventricle has no regional wall motion abnormalities. Left ventricular diastolic parameters are consistent with Grade I diastolic dysfunction (impaired relaxation).  3. Right ventricular systolic function is normal. The right ventricular size is normal. Tricuspid regurgitation signal is inadequate for assessing PA pressure.  4. There is a moderate pericardial effusion up to 1.5 cm over the anterior RV and LV apex. There is no RA/RV diastolic collapse. There is no respiratory variation in MV inflow. The IVC is now well visualized. There are no overt echocardiographic signs of tamponade. Moderate pericardial effusion. The pericardial  effusion is anterior to the right ventricle and surrounding the apex. There is no evidence of cardiac tamponade.  5. The mitral valve is grossly normal. Trivial mitral valve regurgitation. No evidence of mitral stenosis.  6. The aortic valve is tricuspid. Aortic valve regurgitation is not visualized. No aortic stenosis is present. FINDINGS  Left Ventricle: Left ventricular ejection fraction, by estimation, is 60 to 65%. The left ventricle has normal function. The left ventricle has no regional wall motion abnormalities. The left ventricular internal cavity size was normal in size. There is  no left ventricular hypertrophy. Left ventricular diastolic parameters are consistent with Grade I diastolic dysfunction (impaired relaxation). Right Ventricle: The right ventricular size is normal. No increase in right ventricular wall thickness. Right ventricular systolic function is normal. Tricuspid regurgitation signal is inadequate for assessing PA pressure. Left Atrium: Left atrial size was normal in size. Right Atrium: Right atrial size was normal in size. Pericardium: There is a moderate pericardial effusion up to 1.5 cm over the anterior RV and LV apex. There is no RA/RV diastolic collapse. There is no respiratory variation in MV inflow. The IVC is now well visualized. There are no overt echocardiographic signs of tamponade. A moderately sized pericardial effusion is present. The pericardial effusion is anterior to the right ventricle and surrounding the apex. The pericardial effusion appears to contain fibrous material. There is no evidence of cardiac tamponade. Presence of pericardial fat pad. Mitral Valve: The mitral valve is grossly normal. Trivial mitral valve regurgitation. No evidence of mitral valve stenosis. Tricuspid Valve: The tricuspid valve is grossly normal. Tricuspid valve regurgitation is trivial. No evidence of tricuspid stenosis. Aortic Valve: The aortic valve is tricuspid. Aortic valve regurgitation  is not visualized. No aortic stenosis is present. Pulmonic Valve: The pulmonic valve was grossly normal. Pulmonic valve regurgitation is not visualized. No evidence of pulmonic stenosis. Aorta: The aortic root is normal in size and structure. Venous: The inferior vena cava was not well visualized. IAS/Shunts: The atrial septum is grossly normal.  LEFT VENTRICLE PLAX 2D LVIDd:         4.20 cm      Diastology LVIDs:         2.80 cm      LV e' lateral:   11.10 cm/s LV PW:  1.20 cm      LV E/e' lateral: 4.2 LV IVS:        1.10 cm      LV e' medial:    6.64 cm/s LVOT diam:     2.00 cm      LV E/e' medial:  7.0 LV SV:         41 LV SV Index:   25 LVOT Area:     3.14 cm  LV Volumes (MOD) LV vol d, MOD A2C: 116.0 ml LV vol d, MOD A4C: 93.8 ml LV vol s, MOD A2C: 43.3 ml LV vol s, MOD A4C: 39.7 ml LV SV MOD A2C:     72.7 ml LV SV MOD A4C:     93.8 ml LV SV MOD BP:      65.0 ml RIGHT VENTRICLE RV S prime:     18.10 cm/s TAPSE (M-mode): 2.8 cm LEFT ATRIUM           Index       RIGHT ATRIUM           Index LA diam:      3.80 cm 2.29 cm/m  RA Area:     12.00 cm LA Vol (A2C): 32.7 ml 19.74 ml/m RA Volume:   24.80 ml  14.97 ml/m LA Vol (A4C): 24.5 ml 14.79 ml/m  AORTIC VALVE LVOT Vmax:   60.10 cm/s LVOT Vmean:  36.000 cm/s LVOT VTI:    0.132 m  AORTA Ao Root diam: 4.10 cm MITRAL VALVE MV Area (PHT): 2.21 cm    SHUNTS MV Decel Time: 343 msec    Systemic VTI:  0.13 m MV E velocity: 46.20 cm/s  Systemic Diam: 2.00 cm MV A velocity: 45.10 cm/s MV E/A ratio:  1.02 Eleonore Chiquito MD Electronically signed by Eleonore Chiquito MD Signature Date/Time: 10/19/2019/5:35:14 PM    Final    CT HEAD CODE STROKE WO CONTRAST  Result Date: 10/17/2019 CLINICAL DATA:  Code stroke. Subacute deficit with left-sided weakness. EXAM: CT HEAD WITHOUT CONTRAST TECHNIQUE: Contiguous axial images were obtained from the base of the skull through the vertex without intravenous contrast. COMPARISON:  None. FINDINGS: Brain: No evidence of acute  infarction, hemorrhage, hydrocephalus, extra-axial collection or mass lesion/mass effect. Vascular: Negative for hyperdense vessel Skull: Negative Sinuses/Orbits: Mild mucosal edema left maxillary sinus otherwise clear sinuses. Negative orbit. Other: None ASPECTS ([place] Stroke Program Early CT Score) - Ganglionic level infarction (caudate, lentiform nuclei, internal capsule, insula, M1-M3 cortex): 7 - Supraganglionic infarction (M4-M6 cortex): 3 Total score (0-10 with 10 being normal): 10 IMPRESSION: 1. No acute abnormality 2. ASPECTS is 10 3. These results were called by telephone at the time of interpretation on 10/17/2019 at 12:56 pm to provider MATTHEW TRIFAN , who verbally acknowledged these results. Electronically Signed   By: Franchot Gallo M.D.   On: 10/17/2019 12:54   CT ANGIO NECK CODE STROKE  Result Date: 10/17/2019 CLINICAL DATA:  [name] and slurred speech EXAM: CT ANGIOGRAPHY HEAD AND NECK TECHNIQUE: Multidetector CT imaging of the head and neck was performed using the standard protocol during bolus administration of intravenous contrast. Multiplanar CT image reconstructions and MIPs were obtained to evaluate the vascular anatomy. Carotid stenosis measurements (when applicable) are obtained utilizing NASCET criteria, using the distal internal carotid diameter as the denominator. CONTRAST:  68mL OMNIPAQUE IOHEXOL 350 MG/ML SOLN COMPARISON:  None. FINDINGS: CTA NECK Aortic arch: Great vessel origins are incompletely included. Visualized arch is unremarkable. Right carotid system: Patent. There is  no measurable stenosis at the ICA origin. Left carotid system: Common carotid origin is not included. Patent. Trace calcified plaque at the ICA origin without measurable stenosis. Vertebral arteries: Patent. Left vertebral artery is slightly dominant. Skeleton: Evidence of prior anterior fusion at C3-C4 with plate and screw fixation and well incorporated interbody graft. Degenerative changes of  the cervical spine. Other neck: No mass or adenopathy. Upper chest: Included upper lungs are clear. Review of the MIP images confirms the above findings CTA HEAD Anterior circulation: Intracranial internal carotid arteries are patent. Anterior cerebral arteries are patent. 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 Middle cerebral arteries are patent. Posterior circulation: Intracranial vertebral arteries, basilar artery, and posterior cerebral arteries are patent. Venous sinuses: Patent as allowed by contrast bolus timing. Review of the MIP images confirms the above findings IMPRESSION: No large vessel occlusion or hemodynamically significant stenosis. Electronically Signed   By: Macy Mis M.D.   On: 10/17/2019 13:15    Labs:  CBC: Recent Labs    11/12/19 0358 11/13/19 0322 11/14/19 0333 11/15/19 0341  WBC 3.9* 6.4 9.4 8.1  HGB 7.9* 7.6* 7.5* 7.3*  HCT 25.3* 24.7* 24.6* 23.9*  PLT 97* 96* 116* 126*    COAGS: Recent Labs    10/17/19 1241 10/17/19 1311 11/07/19 1836 11/07/19 1853 11/10/19 1052  INR 1.5* 1.6* 1.2  --  1.3*  APTT 43* 46*  --  31  --     BMP: Recent Labs    11/12/19 0358 11/13/19 0322 11/14/19 0333 11/15/19 0341  NA 143 135 143 135  K 3.9 3.4* 4.4 4.7  CL 113* 109 114* 108  CO2 18* 18* 22 20*  GLUCOSE 79 122* 90 89  BUN 54* 50* 46* 43*  CALCIUM 7.0* 6.8* 7.4* 7.3*  CREATININE 2.82* 2.57* 2.52* 2.53*  GFRNONAA 24* 27* 28* 27*  GFRAA 28* 31* 32* 32*    LIVER FUNCTION TESTS: Recent Labs    10/19/19 0336 10/20/19 0618 11/07/19 1836 11/10/19 0932  BILITOT 0.7 0.5 0.6 0.4  AST 24 26 36 17  ALT 19 19 53* 41  ALKPHOS 86 94 180* 111  PROT 4.6* 5.0* 7.5 5.6*  ALBUMIN 1.6* 1.6* 2.7* 1.8*    TUMOR MARKERS: No results for input(s): AFPTM, CEA, CA199, CHROMGRNA in the last 8760 hours.  Assessment and Plan:  Right hydronephrosis; colovesical fistula: Chase [name]., 54 year old male, is scheduled to be seen at the [facility] department for a right percutaneous nephrostomy and an abdominal fluid collection aspiration with drain placement. Due to the imaging modalities required for each procedure, Dr. Earleen Newport will perform an image-guided abdominal fluid collection aspiration with drain placement today, 11/15/19 and the patient will possibly have the right nephrostomy tube placed tomorrow, 11/16/19.   Risks and benefits of an abdominal fluid collection aspiration with drain placement were discussed with the patient including bleeding, infection, damage to adjacent structures, bowel perforation/fistula connection, and sepsis.  Risks and benefits of a right PCN placement were discussed with the patient including, but not limited to, infection, bleeding, significant bleeding causing loss or decrease in renal function or damage to adjacent structures.   All of the patient's questions were answered, patient is agreeable to proceed.  This patient has been NPO. Labs and vitals have been reviewed. He is not on any blood thinning medications.   Consent signed and in chart.  Thank you for this interesting consult.  I greatly enjoyed meeting DERL ABALOS [name] and look forward  to participating in their care.  A copy of this report was sent to the requesting provider on this date.  Electronically Signed: Soyla Dryer, AGACNP-BC [phone] 11/15/2019, 10:46 AM   I spent a total of 40 Minutes    in face to face in clinical consultation, greater than 50% of which was counseling/coordinating care for abdominal fluid collection aspiration with drain placement; right PCN.

## 2019-11-15 NOTE — Progress Notes (Signed)
Patient ID: Chase [name], male   DOB: [birth year], 54 y.o.   MRN: [id number]  1 Day Post-Op Subjective: Pt found to have evidence of a colovesical fistula intraoperatively yesterday with what appears to be a large abscess cavity between the bladder and colon explaining the abnormal findings on CT and ultrasound recently.  CT imaging with contrast in the bladder confirms his diagnosis.  He also had bilateral hydronephrosis.  I was able to place a left stent but could not get a retrograde stent up his right ureter.  He feels well today without pain or other complaints.  Objective: Vital signs in last 24 hours: Temp:  [97.5 F (36.4 C)-98.5 F (36.9 C)] 98.3 F (36.8 C) (08/16 0555) Pulse Rate:  [56-83] 59 (08/16 0555) Resp:  [11-19] 18 (08/16 0555) BP: (96-128)/(61-75) 112/72 (08/16 0555) SpO2:  [96 %-100 %] 100 % (08/16 0555) Weight:  [54.8 kg] 54.8 kg (08/16 0555)  Intake/Output from previous day: 08/15 0701 - 08/16 0700 In: 1413.8 [P.O.:255; I.V.:1058.8; IV Piggyback:100] Out: 0630 [Urine:3350; Blood:1] Intake/Output this shift: No intake/output data recorded.  Physical Exam:  General: Alert and oriented Abdomen: Soft, ND GU: Catheter draining well and mostly clear  Lab Results: Recent Labs    11/13/19 0322 11/14/19 0333 11/15/19 0341  HGB 7.6* 7.5* 7.3*  HCT 24.7* 24.6* 23.9*   CBC Latest Ref Rng & Units 11/15/2019 11/14/2019 11/13/2019  WBC 4.0 - 10.5 K/uL 8.1 9.4 6.4  Hemoglobin 13.0 - 17.0 g/dL 7.3(L) 7.5(L) 7.6(L)  Hematocrit 39 - 52 % 23.9(L) 24.6(L) 24.7(L)  Platelets 150 - 400 K/uL 126(L) 116(L) 96(L)     BMET Recent Labs    11/14/19 0333 11/15/19 0341  NA 143 135  K 4.4 4.7  CL 114* 108  CO2 22 20*  GLUCOSE 90 89  BUN 46* 43*  CREATININE 2.52* 2.53*  CALCIUM 7.4* 7.3*     Studies/Results: CT PELVIS WO CONTRAST  Result Date: 11/14/2019 CLINICAL DATA:  Possible colovesical fistula EXAM: CT PELVIS WITHOUT CONTRAST TECHNIQUE: Multidetector CT  imaging of the pelvis was performed following the standard protocol without intravenous contrast. Additionally a subsequent images were obtained following installation of dilute contrast material into the urinary bladder via the indwelling Foley catheter. COMPARISON:  Ultrasound from 11/12/2019, CT from 11/07/2019 and recent cystoscopy films from earlier in the same day. FINDINGS: Urinary Tract: The initial images obtained through the pelvis demonstrate the Foley catheter in a relatively decompressed urinary bladder displaced anteriorly. A left ureteral stent is noted in place and there is contrast material within the right ureter related to the recent retrograde pyelogram. The bladder is significantly thickened circumferentially and a large fluid collection is noted with dependent density within which measures approximately 9.5 x 8.5 cm in greatest transverse and AP dimensions respectively. This shows considerable contrast within the posterior more solid portion with extension towards the colon as well as a considerable amount of contrast within the rectosigmoid related to the recent injection during cystoscopy. Subsequently the bladder was instilled with contrast material which distended the bladder somewhat and better delineated the walls of the bladder. Considerable trabeculation is noted. There is a rent identified in the posterior aspect of the bladder which corresponds with that seen on cystoscopy with extension of contrast material into the large abscess identified posteriorly. The more solid portion of this fluid collection may represent some hematoma within the abscess collection. There is irregularity noted posteriorly which extends towards the colon and opacifies the colon although a definitive  tract between the colon and this collection is not well appreciated on this particular exam. Bowel: The rectosigmoid demonstrates contrast within from prior cystoscopic injection. There is an area with anterior to  the rectum where there is loss of the normal fat plane between the known collection posterior to the bladder and the rectum likely the site of the fistula extending to the colon. A surgical clip is noted in this region and the fat plane is more blurred when compared with the prior CT examination from 11/07/2019. The remainder of the visualized bowel appears within normal limits. Vascular/Lymphatic: Atherosclerotic calcifications are noted without aneurysmal dilatation. Reproductive:  Prostate appears within normal limits. Other: As described above, there is a large abscess interposed between the rectum and the decompressed bladder which was seen on the prior ultrasound examination but originally thought to represent thrombus within the bladder as opposed to a posterior collection in the pelvis. Musculoskeletal: No acute bony abnormality is noted. IMPRESSION: Large air-fluid collection consistent with abscess interposed between the posterior wall of the bladder in the anterior wall of the rectum. Some solid material is noted within likely representing thrombus. 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 Left ureteral stent is noted. Critical Value/emergent results were called by telephone at the time of interpretation on 11/14/2019 at 11:37 am to Dr. Raynelle Bring , who verbally acknowledged these results. Electronically Signed   By: Inez Catalina M.D.   On: 11/14/2019 11:37   DG C-Arm 1-60 Min-No Report  Result Date: 11/14/2019 Fluoroscopy was utilized by the requesting physician.  No radiographic interpretation.    Assessment/Plan: 1) Colovesical fistula/pelvic abscess: Continue urethral catheter.  Hopefully, IR can place percutaneous drain into pelvic abscess.  Continue antibiotic therapy.   General Surgery following.  Will eventually need surgery for correction following resolution of acute infection and when medically optimized.  Considering recent stroke and malnutrition, this will likely be 4-6 months from now. 2) Urinary retention: Will continue catheter drainage for now until fistula is addressed definitively. 3) Bilateral ureteral obstruction: Left ureteral stent placed.  Plan for right nephrostomy by IR today with subsequent antegrade stent once decompressed.   LOS: 7 days   [name] 11/15/2019, 7:19 AM

## 2019-11-15 NOTE — Progress Notes (Signed)
1 Day Post-Op    CC: Fever/hypotension  Subjective: Patient lying in bed comfortable, he is n.p.o. and awaiting IR procedure.  Foley in place with ongoing hematuria.  No abdominal pain or discomfort passing gas no BM yesterday.  Objective: Vital signs in last 24 hours: Temp:  [97.5 F (36.4 C)-98.5 F (36.9 C)] 98.3 F (36.8 C) (08/16 0555) Pulse Rate:  [56-83] 59 (08/16 0555) Resp:  [11-19] 18 (08/16 0555) BP: (96-128)/(61-75) 112/72 (08/16 0555) SpO2:  [96 %-100 %] 100 % (08/16 0555) Weight:  [54.8 kg] 54.8 kg (08/16 0555) Last BM Date: 11/13/19 255 p.o. 1158 IV Urine 3350 Afebrile vital signs are stable Creatinine 2.53 Prealbumin 18 (norm 18-38) WBC 8.1 H/H 7.3/23.9 Platelets 126,000 CT of the pelvis without contrast 11/14/2019: Large fluid collection consistent with an abscess interposed between the posterior wall of the bladder anterior wall the rectum.  Direct fistulization from the bladder into this collection with subsequent passage of contrast into the rectosigmoid. Left ureteral stent Intake/Output from previous day: 08/15 0701 - 08/16 0700 In: 1413.8 [P.O.:255; I.V.:1058.8; IV Piggyback:100] Out: 2119 [Urine:3350; Blood:1] Intake/Output this shift: No intake/output data recorded.  General appearance: alert, cooperative and no distress Resp: clear to auscultation bilaterally GI: Soft, nontender, not distended, bowel sounds are hypoactive.  Passing flatus.  Lab Results:  Recent Labs    11/14/19 0333 11/15/19 0341  WBC 9.4 8.1  HGB 7.5* 7.3*  HCT 24.6* 23.9*  PLT 116* 126*    BMET Recent Labs    11/14/19 0333 11/15/19 0341  NA 143 135  K 4.4 4.7  CL 114* 108  CO2 22 20*  GLUCOSE 90 89  BUN 46* 43*  CREATININE 2.52* 2.53*  CALCIUM 7.4* 7.3*   PT/INR No results for input(s): LABPROT, INR in the last 72 hours.  Recent Labs  Lab 11/10/19 0932  AST 17  ALT 41  ALKPHOS 111  BILITOT 0.4  PROT 5.6*  ALBUMIN 1.8*     Lipase  No results  found for: LIPASE   Medications: . Chlorhexidine Gluconate Cloth  6 each Topical Daily  . DULoxetine  60 mg Oral Daily  . feeding supplement (KATE [employer]ARMS STANDARD 1.4)  325 mL Oral BID BM  . ferrous sulfate  325 mg Oral Q breakfast  . gabapentin  300 mg Oral BID  . insulin aspart  0-9 Units Subcutaneous TID WC  . midodrine  5 mg Oral TID AC  . multivitamin with minerals  1 tablet Oral Daily  . pantoprazole  40 mg Oral BID  . tamsulosin  0.4 mg Oral Daily   . ceFEPime (MAXIPIME) IV 2 g (11/15/19 0835)  . 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 Raynelle Bring, POD #1 Urinary retention -Foley in place Bilateral ureteral obstruction/left renal stent plan IR nephrostomy today   FEN: IV fluids/n.p.o. ID: Rocephin 8/9/-8/10; Maxipime 8/8 x 1,  8/11>> day 4 DVT: SCDs/off heparin for hematuria  Plan: Allow the current  acute illness to resolve.  Once he is taking p.o.'s  work on increasing his nutrition.  Arrange for follow-up and elective partial colectomy/repair of colovesicular fistula.   LOS: 7 days    Chase [name] 11/15/2019 Please see Amion

## 2019-11-15 NOTE — Procedures (Signed)
Interventional Radiology Procedure Note  Procedure: Image guided drain placement, pelvic abscess.  44F pigtail drain.  Complications: None  EBL: None Sample: Culture sent  Recommendations: - Routine drain care, with sterile flushes, record output - follow up Cx - routine wound care  Signed,  Dulcy Fanny. Earleen Newport, DO

## 2019-11-15 NOTE — Progress Notes (Signed)
PROGRESS NOTE    Chase [name]  LPF:[id number] DOB: 10-Oct-1965 DOA: 11/07/2019 PCP: [name]   Chief Complaint  Patient presents with  . 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  Initially found to be in septic shock from urosepsis started on pressors, Urine culture with E coli and pseudomonas and Blood culture with E coli and proteus, has been on antibiotics then transition to cefepime. He  Reports he had hematuria from traumatic pulling of foley when it got caught during bed transfer. But he reports he has had hematuria before. 8/13 seen by urology and renal ultrasound shows "Large avascular heterogeneous intraluminal mass within the urinary bladder measuring up to 9.4 cm. Findings are most compatible with a large intraluminal blood clot in the setting of hematuria. Follow-up to resolution is recommended to exclude the presence of an underlying malignancy." 8/15-underwent cystoscopy, bilateral retrograde pyelogram with left ureteral stent placement and fulguration of bladder, found to have bilateral hydronephrosis and probable colovesical fistula.  Subjective: Seen this morning.  Denies any abdominal pain nausea vomiting.  Foley in place with hematuria. N.p.o. for procedure today by IR.  Assessment & Plan:  Septic shock 2/2 UTI, Urine culture with E coli and pseudomonas and Blood culture with E coli and proteus:Shock physiology resolved. On midodrine.On Cefepime and will continue the same. SEPSIS 2/2  colovesical fistula with a probably large abscess cavity between the colon and the bladder.  Appreciate urology input.  Plan is for IR to place right nephrostomy today and percutaneous drain into pelvic abscess. Dr.  Ninfa Linden has seen the patient.  Overall hemodynamically stable.  AKI on CKD stage IIIa chronic urine retention secondary to BPH.  Baseline creatinine 2.3 peaked to 3.0.  Creatinine 2.5.  He does have bilateral hydronephrosis, left ureteric stent placed today and plan for right nephrostomy tube.obstructive uropathy may be playing some role in his CKD including sepsis physiology for AKI. Recent Labs  Lab 11/11/19 0821 11/12/19 0358 11/13/19 0322 11/14/19 0333 11/15/19 0341  BUN 62* 54* 50* 46* 43*  CREATININE 3.02* 2.82* 2.57* 2.52* 2.53*   Possible colovesical fistula with large abscess between bladder and colon: For IR Rt nephrostomy tube placement and abscess drainage today   Acute blood loss anemia due to hematuria in the setting of chronic anemia. Hemoglobin dropped as low as 6.2 needing 1 unit PRBC. Hb slowly downtrending monitor closely and transfuse. Recent Labs  Lab 11/11/19 0329 11/12/19 0358 11/13/19 0322 11/14/19 0333 11/15/19 0341  HGB 7.3* 7.9* 7.6* 7.5* 7.3*   Recent CVA in July 2021 on aspirin, continue PT OT.  He has significant unilateral leg weakness   Chronic pain: Continue home Norco, Cymbalta and Neurontin.   Adult failure to thrive with history of gastric bypass, sacral stage II pressure ulcer no signs of infection.  Augment nutritional status.  PT OT evaluation   GERD continue PPI..  DVT prophylaxis: Place and maintain sequential compression device Start: 11/10/19 1028 Code Status:   Code Status: Full Code  Family Communication: plan of care discussed with patient at bedside.  Status is: Inpatient  Remains inpatient appropriate because:IV treatments appropriate due to intensity of illness or inability to take PO and Inpatient level of care appropriate due to severity of illness  Dispo: The patient is from: SNF              Anticipated d/c is to: SNF              Anticipated d/c date is: 2 days              Patient currently is not medically stable  to d/c.  Remains hospitalized for ongoing treatment of his renal failure sepsis, abscess  Nutrition: Diet Order            Diet NPO time specified Except for: Sips with Meds  Diet effective midnight                 Nutrition Problem: Severe Malnutrition Etiology: chronic illness Signs/Symptoms: severe fat depletion, severe muscle depletion Interventions: Other (Comment) [employer]) Body mass index is 18.37 kg/m.  Consultants:see note  Procedures:see note Microbiology:see note Blood Culture    Component Value Date/Time   SDES  11/07/2019 1845    URINE, RANDOM Performed at [facility], [place] [address], [place], [place] [address]    SPECREQUEST  11/07/2019 1845    NONE Performed at [facility], [place] [address] [place], [place] [address]    CULT (A) 11/07/2019 1845    70,000 COLONIES/mL ESCHERICHIA COLI >=100,000 COLONIES/mL PSEUDOMONAS AERUGINOSA    REPTSTATUS 11/10/2019 FINAL 11/07/2019 1845    Other culture-see note  Medications: Scheduled Meds: . Chlorhexidine Gluconate Cloth  6 each Topical Daily  . DULoxetine  60 mg Oral Daily  . feeding supplement (KATE FARMS STANDARD 1.4)  325 mL Oral BID BM  . ferrous sulfate  325 mg Oral Q breakfast  . gabapentin  300 mg Oral BID  . insulin aspart  0-9 Units Subcutaneous TID WC  . midodrine  5 mg Oral TID AC  . multivitamin with minerals  1 tablet Oral Daily  . pantoprazole  40 mg Oral BID  . tamsulosin  0.4 mg Oral Daily   Continuous Infusions: . ceFEPime (MAXIPIME) IV 2 g (11/15/19 0835)  . lactated ringers 75 mL/hr at 11/15/19 0230    Antimicrobials: Anti-infectives (From admission, onward)   Start     Dose/Rate Route Frequency Ordered Stop   11/10/19 0830  ceFEPIme (MAXIPIME) 2 g in sodium chloride 0.9 % 100 mL IVPB     Discontinue     2 g 200 mL/hr over 30 Minutes Intravenous Every 24 hours 11/10/19 0815     11/08/19 2000  ceFEPIme (MAXIPIME) 2 g in sodium chloride 0.9  % 100 mL IVPB  Status:  Discontinued        2 g 200 mL/hr over 30 Minutes Intravenous Every 24 hours 11/08/19 0025 11/08/19 1037   11/08/19 2000  vancomycin (VANCOREADY) IVPB 500 mg/100 mL  Status:  Discontinued        500 mg 100 mL/hr over 60 Minutes Intravenous Every 24 hours 11/08/19 0027 11/08/19 1037   11/08/19 1800  cefTRIAXone (ROCEPHIN) 2 g in sodium chloride 0.9 % 100 mL IVPB  Status:  Discontinued        2 g 200 mL/hr over 30 Minutes Intravenous Every 24 hours 11/08/19 1037 11/10/19 0815   11/08/19 0300  ceFEPIme (MAXIPIME) 1 g in sodium chloride 0.9 % 100 mL IVPB  Status:  Discontinued        1 g 200 mL/hr over 30 Minutes Intravenous Every 12 hours 11/08/19 0021 11/08/19 0025   11/07/19 1930  vancomycin (VANCOREADY) IVPB 1250 mg/250 mL  1,250 mg 166.7 mL/hr over 90 Minutes Intravenous  Once 11/07/19 1858 11/07/19 2154   11/07/19 1900  ceFEPIme (MAXIPIME) 2 g in sodium chloride 0.9 % 100 mL IVPB        2 g 200 mL/hr over 30 Minutes Intravenous  Once 11/07/19 1854 11/07/19 2009   11/07/19 1900  metroNIDAZOLE (FLAGYL) IVPB 500 mg        500 mg 100 mL/hr over 60 Minutes Intravenous  Once 11/07/19 1854 11/07/19 2325   11/07/19 1900  vancomycin (VANCOCIN) IVPB 1000 mg/200 mL premix  Status:  Discontinued        1,000 mg 200 mL/hr over 60 Minutes Intravenous  Once 11/07/19 1854 11/07/19 1858       Objective: Vitals: Today's Vitals   11/15/19 0105 11/15/19 0507 11/15/19 0555 11/15/19 1039  BP:   112/72   Pulse:   (!) 59   Resp:   18   Temp:   98.3 F (36.8 C)   TempSrc:   Oral   SpO2:   100%   Weight:   54.8 kg   Height:      PainSc: Asleep 8   4     Intake/Output Summary (Last 24 hours) at 11/15/2019 1307 Last data filed at 11/15/2019 1200 Gross per 24 hour  Intake 713.75 ml  Output 5150 ml  Net -4436.25 ml   Filed Weights   11/15/19 0555  Weight: 54.8 kg   Weight change:    Intake/Output from previous day: 08/15 0701 - 08/16 0700 In: 1413.8  [P.O.:255; I.V.:1058.8; IV Piggyback:100] Out: 2725 [Urine:3350; Blood:1] Intake/Output this shift: Total I/O In: -  Out: 1900 [Urine:1900]  Examination:  General exam: AAO X3, NAD, weak appearing. HEENT:Oral mucosa moist, Ear/Nose WNL grossly, dentition normal. Respiratory system: bilaterally clear to auscultation,no wheezing or crackles,no use of accessory muscle Cardiovascular system: S1 & S2 +, No JVD,. Gastrointestinal system: Abdomen soft, NT,ND, BS+ Nervous System:Alert, awake,weal LLE Extremities: No edema, distal peripheral pulses palpable.  Skin: No rashes,no icterus. MSK: Normal muscle bulk,tone, power Foley in place with hematuria  Data Reviewed: I have personally reviewed following labs and imaging studies CBC: Recent Labs  Lab 11/11/19 0329 11/12/19 0358 11/13/19 0322 11/14/19 0333 11/15/19 0341  WBC 8.7 3.9* 6.4 9.4 8.1  HGB 7.3* 7.9* 7.6* 7.5* 7.3*  HCT 23.3* 25.3* 24.7* 24.6* 23.9*  MCV 93.6 94.1 96.1 97.2 97.2  PLT 114* 97* 96* 116* 366*   Basic Metabolic Panel: Recent Labs  Lab 11/11/19 0821 11/12/19 0358 11/13/19 0322 11/14/19 0333 11/15/19 0341  NA 139 143 135 143 135  K 3.6 3.9 3.4* 4.4 4.7  CL 109 113* 109 114* 108  CO2 21* 18* 18* 22 20*  GLUCOSE 110* 79 122* 90 89  BUN 62* 54* 50* 46* 43*  CREATININE 3.02* 2.82* 2.57* 2.52* 2.53*  CALCIUM 7.4* 7.0* 6.8* 7.4* 7.3*  MG 2.4 2.2 2.1 2.0 1.9   GFR: Estimated Creatinine Clearance: 25.6 mL/min (A) (by C-G formula based on SCr of 2.53 mg/dL (H)). Liver Function Tests: Recent Labs  Lab 11/10/19 0932  AST 17  ALT 41  ALKPHOS 111  BILITOT 0.4  PROT 5.6*  ALBUMIN 1.8*   No results for input(s): LIPASE, AMYLASE in the last 168 hours. No results for input(s): AMMONIA in the last 168 hours. Coagulation Profile: Recent Labs  Lab 11/10/19 1052  INR 1.3*   Cardiac Enzymes: No results for input(s): CKTOTAL, CKMB, CKMBINDEX, TROPONINI in the last 168 hours. BNP (last 3 results)  No  results for input(s): PROBNP in the last 8760 hours. HbA1C: No results for input(s): HGBA1C in the last 72 hours. CBG: Recent Labs  Lab 11/14/19 1116 11/14/19 1652 11/14/19 2154 11/15/19 0752 11/15/19 1127  GLUCAP 101* 212* 98 87 77   Lipid Profile: No results for input(s): CHOL, HDL, LDLCALC, TRIG, CHOLHDL, LDLDIRECT in the last 72 hours. Thyroid Function Tests: No results for input(s): TSH, T4TOTAL, FREET4, T3FREE, THYROIDAB in the last 72 hours. Anemia Panel: Recent Labs    11/15/19 0341  VITAMINB12 298  FOLATE 3.2*  FERRITIN 103  TIBC 191*  IRON 36*  RETICCTPCT 2.3   Sepsis Labs: No results for input(s): PROCALCITON, LATICACIDVEN in the last 168 hours.  Recent Results (from the past 240 hour(s))  Culture, blood (Routine x 2)     Status: Abnormal   Collection Time: 11/07/19  6:36 PM   Specimen: BLOOD  Result Value Ref Range Status   Specimen Description   Final    BLOOD RIGHT ANTECUBITAL Performed at [facility] [address], [place], [place] [address]    Special Requests   Final    BOTTLES DRAWN AEROBIC AND ANAEROBIC Blood Culture adequate volume Performed at [facility] [address] [place], [place] [address]    Culture  Setup Time   Final    GRAM NEGATIVE RODS IN BOTH AEROBIC AND ANAEROBIC BOTTLES CRITICAL RESULT CALLED TO, READ BACK BY AND VERIFIED WITH: Guadlupe Spanish PharmD 10:00 11/08/19 ([email or web address]) Performed at [facility], [address] [place], [place] [address]    Culture ESCHERICHIA COLI PROTEUS MIRABILIS  (A)  Final   Report Status 11/10/2019 FINAL  Final   Organism ID, Bacteria ESCHERICHIA COLI  Final   Organism ID, Bacteria PROTEUS MIRABILIS  Final      Susceptibility   Escherichia coli - MIC*    AMPICILLIN <=2 SENSITIVE Sensitive     CEFAZOLIN <=4 SENSITIVE Sensitive     CEFEPIME <=0.12 SENSITIVE Sensitive     CEFTAZIDIME <=1 SENSITIVE Sensitive     CEFTRIAXONE <=0.25 SENSITIVE Sensitive      CIPROFLOXACIN <=0.25 SENSITIVE Sensitive     GENTAMICIN <=1 SENSITIVE Sensitive     IMIPENEM <=0.25 SENSITIVE Sensitive     TRIMETH/SULFA <=20 SENSITIVE Sensitive     AMPICILLIN/SULBACTAM <=2 SENSITIVE Sensitive     PIP/TAZO <=4 SENSITIVE Sensitive     * ESCHERICHIA COLI   Proteus mirabilis - MIC*    AMPICILLIN <=2 SENSITIVE Sensitive     CEFAZOLIN <=4 SENSITIVE Sensitive     CEFEPIME <=0.12 SENSITIVE Sensitive     CEFTAZIDIME <=1 SENSITIVE Sensitive     CEFTRIAXONE <=0.25 SENSITIVE Sensitive     CIPROFLOXACIN <=0.25 SENSITIVE Sensitive     GENTAMICIN <=1 SENSITIVE Sensitive     IMIPENEM 2 SENSITIVE Sensitive     TRIMETH/SULFA <=20 SENSITIVE Sensitive     AMPICILLIN/SULBACTAM <=2 SENSITIVE Sensitive     PIP/TAZO <=4 SENSITIVE Sensitive     * PROTEUS MIRABILIS  Blood Culture ID Panel (Reflexed)     Status: Abnormal   Collection Time: 11/07/19  6:36 PM  Result Value Ref Range Status   Enterococcus faecalis NOT DETECTED NOT DETECTED Final   Enterococcus Faecium NOT DETECTED NOT DETECTED Final   Listeria monocytogenes NOT DETECTED NOT DETECTED Final   Staphylococcus species NOT DETECTED NOT DETECTED Final   Staphylococcus aureus (BCID) NOT DETECTED NOT DETECTED Final   Staphylococcus epidermidis NOT DETECTED NOT DETECTED Final   Staphylococcus lugdunensis  NOT DETECTED NOT DETECTED Final   Streptococcus species NOT DETECTED NOT DETECTED Final   Streptococcus agalactiae NOT DETECTED NOT DETECTED Final   Streptococcus pneumoniae NOT DETECTED NOT DETECTED Final   Streptococcus pyogenes NOT DETECTED NOT DETECTED Final   A.calcoaceticus-baumannii NOT DETECTED NOT DETECTED Final   Bacteroides fragilis NOT DETECTED NOT DETECTED Final   Enterobacterales DETECTED (A) NOT DETECTED Final    Comment: CRITICAL RESULT CALLED TO, READ BACK BY AND VERIFIED WITH: Guadlupe Spanish PharmD 10:00 11/08/19 ([email or web address])    Enterobacter cloacae complex NOT DETECTED NOT DETECTED Final   Escherichia coli  DETECTED (A) NOT DETECTED Final    Comment: CRITICAL RESULT CALLED TO, READ BACK BY AND VERIFIED WITH: Guadlupe Spanish PharmD 10:00 11/08/19 ([email or web address])    Klebsiella aerogenes NOT DETECTED NOT DETECTED Final   Klebsiella oxytoca NOT DETECTED NOT DETECTED Final   Klebsiella pneumoniae NOT DETECTED NOT DETECTED Final   Proteus species DETECTED (A) NOT DETECTED Final    Comment: CRITICAL RESULT CALLED TO, READ BACK BY AND VERIFIED WITH: Guadlupe Spanish PharmD 10:00 11/08/19 ([email or web address])    Salmonella species NOT DETECTED NOT DETECTED Final   Serratia marcescens NOT DETECTED NOT DETECTED Final   Haemophilus influenzae NOT DETECTED NOT DETECTED Final   Neisseria meningitidis NOT DETECTED NOT DETECTED Final   Pseudomonas aeruginosa NOT DETECTED NOT DETECTED Final   Stenotrophomonas maltophilia NOT DETECTED NOT DETECTED Final   Candida albicans NOT DETECTED NOT DETECTED Final   Candida auris NOT DETECTED NOT DETECTED Final   Candida glabrata NOT DETECTED NOT DETECTED Final   Candida krusei NOT DETECTED NOT DETECTED Final   Candida parapsilosis NOT DETECTED NOT DETECTED Final   Candida tropicalis NOT DETECTED NOT DETECTED Final   Cryptococcus neoformans/gattii NOT DETECTED NOT DETECTED Final   CTX-M ESBL NOT DETECTED NOT DETECTED Final   Carbapenem resistance IMP NOT DETECTED NOT DETECTED Final   Carbapenem resistance KPC NOT DETECTED NOT DETECTED Final   Carbapenem resistance NDM NOT DETECTED NOT DETECTED Final   Carbapenem resist OXA 48 LIKE NOT DETECTED NOT DETECTED Final   Carbapenem resistance VIM NOT DETECTED NOT DETECTED Final    Comment: Performed at [facility], [address] [place], [place] [address]  Culture, blood (Routine x 2)     Status: Abnormal   Collection Time: 11/07/19  6:41 PM   Specimen: BLOOD  Result Value Ref Range Status   Specimen Description   Final    BLOOD LEFT ANTECUBITAL Performed at [facility] [address], [place], [place] [address]     Special Requests   Final    BOTTLES DRAWN AEROBIC AND ANAEROBIC Blood Culture adequate volume Performed at [facility] [address], [place], [place] [address]    Culture  Setup Time   Final    GRAM NEGATIVE RODS IN BOTH AEROBIC AND ANAEROBIC BOTTLES CRITICAL VALUE NOTED.  VALUE IS CONSISTENT WITH PREVIOUSLY REPORTED AND CALLED VALUE.    Culture (A)  Final    ESCHERICHIA COLI SUSCEPTIBILITIES PERFORMED ON PREVIOUS CULTURE WITHIN THE LAST 5 DAYS. Performed at [facility], [place] [address], [place], [place] [address]    Report Status 11/10/2019 FINAL  Final  Urine culture     Status: Abnormal   Collection Time: 11/07/19  6:45 PM   Specimen: Urine, Random  Result Value Ref Range Status   Specimen Description   Final    URINE, RANDOM Performed at [facility] [address] [place],  [place] [address]    Special Requests   Final    NONE Performed at [facility], [place] [address], [place], [place] [address]    Culture (A)  Final    70,000 COLONIES/mL ESCHERICHIA COLI >=100,000 COLONIES/mL PSEUDOMONAS AERUGINOSA    Report Status 11/10/2019 FINAL  Final   Organism ID, Bacteria ESCHERICHIA COLI (A)  Final   Organism ID, Bacteria PSEUDOMONAS AERUGINOSA (A)  Final      Susceptibility   Escherichia coli - MIC*    AMPICILLIN <=2 SENSITIVE Sensitive     CEFAZOLIN <=4 SENSITIVE Sensitive     CEFTRIAXONE <=0.25 SENSITIVE Sensitive     CIPROFLOXACIN <=0.25 SENSITIVE Sensitive     GENTAMICIN <=1 SENSITIVE Sensitive     IMIPENEM <=0.25 SENSITIVE Sensitive     NITROFURANTOIN <=16 SENSITIVE Sensitive     TRIMETH/SULFA <=20 SENSITIVE Sensitive     AMPICILLIN/SULBACTAM <=2 SENSITIVE Sensitive     PIP/TAZO <=4 SENSITIVE Sensitive     * 70,000 COLONIES/mL ESCHERICHIA COLI   Pseudomonas aeruginosa - MIC*    CEFTAZIDIME 4 SENSITIVE Sensitive     CIPROFLOXACIN 0.5 SENSITIVE Sensitive     GENTAMICIN <=1 SENSITIVE Sensitive      IMIPENEM 1 SENSITIVE Sensitive     PIP/TAZO 8 SENSITIVE Sensitive     CEFEPIME 2 SENSITIVE Sensitive     * >=100,000 COLONIES/mL PSEUDOMONAS AERUGINOSA  SARS Coronavirus 2 by RT PCR (hospital order, performed in [facility] hospital lab) Nasopharyngeal Nasopharyngeal Swab     Status: None   Collection Time: 11/07/19  7:08 PM   Specimen: Nasopharyngeal Swab  Result Value Ref Range Status   SARS Coronavirus 2 NEGATIVE NEGATIVE Final    Comment: (NOTE) SARS-CoV-2 target nucleic acids are NOT DETECTED.  The SARS-CoV-2 RNA is generally detectable in upper and lower respiratory specimens during the acute phase of infection. The lowest concentration of SARS-CoV-2 viral copies this assay can detect is 250 copies / mL. A negative result does not preclude SARS-CoV-2 infection and should not be used as the sole basis for treatment or other patient management decisions.  A negative result may occur with improper specimen collection / handling, submission of specimen other than nasopharyngeal swab, presence of viral mutation(s) within the areas targeted by this assay, and inadequate number of viral copies (<250 copies / mL). A negative result must be combined with clinical observations, patient history, and epidemiological information.  Fact Sheet for Patients:   StrictlyIdeas.no  Fact Sheet for Healthcare Providers: BankingDealers.co.za  This test is not yet approved or  cleared by the [place] FDA and has been authorized for detection and/or diagnosis of SARS-CoV-2 by FDA under an Emergency Use Authorization (EUA).  This EUA will remain in effect (meaning this test can be used) for the duration of the COVID-19 declaration under Section 564(b)(1) of the Act, 21 U.S.C. section 360bbb-3(b)(1), unless the authorization is terminated or revoked sooner.  Performed at [facility], [place] [address] [place], [place] [address]    MRSA PCR Screening     Status: None   Collection Time: 11/08/19  2:35 AM   Specimen: Nasal Mucosa; Nasopharyngeal  Result Value Ref Range Status   MRSA by PCR NEGATIVE NEGATIVE Final    Comment:        The GeneXpert MRSA Assay (FDA approved for NASAL specimens only), is one component of a comprehensive MRSA colonization surveillance program. It is not intended to diagnose MRSA infection nor to guide or monitor treatment for MRSA  infections. Performed at [facility], [place] [address] [place], [place] [address]       Radiology Studies: CT PELVIS WO CONTRAST  Result Date: 11/14/2019 CLINICAL DATA:  Possible colovesical fistula EXAM: CT PELVIS WITHOUT CONTRAST TECHNIQUE: Multidetector CT imaging of the pelvis was performed following the standard protocol without intravenous contrast. Additionally a subsequent images were obtained following installation of dilute contrast material into the urinary bladder via the indwelling Foley catheter. COMPARISON:  Ultrasound from 11/12/2019, CT from 11/07/2019 and recent cystoscopy films from earlier in the same day. FINDINGS: Urinary Tract: The initial images obtained through the pelvis demonstrate the Foley catheter in a relatively decompressed urinary bladder displaced anteriorly. A left ureteral stent is noted in place and there is contrast material within the right ureter related to the recent retrograde pyelogram. The bladder is significantly thickened circumferentially and a large fluid collection is noted with dependent density within which measures approximately 9.5 x 8.5 cm in greatest transverse and AP dimensions respectively. This shows considerable contrast within the posterior more solid portion with extension towards the colon as well as a considerable amount of contrast within the rectosigmoid related to the recent injection during cystoscopy. Subsequently the bladder was instilled with contrast material which distended the  bladder somewhat and better delineated the walls of the bladder. Considerable trabeculation is noted. There is a rent identified in the posterior aspect of the bladder which corresponds with that seen on cystoscopy with extension of contrast material into the large abscess identified posteriorly. The more solid portion of this fluid collection may represent some hematoma within the abscess collection. There is irregularity noted posteriorly which extends towards the colon and opacifies the colon although a definitive tract between the colon and this collection is not well appreciated on this particular exam. Bowel: The rectosigmoid demonstrates contrast within from prior cystoscopic injection. There is an area with anterior to the rectum where there is loss of the normal fat plane between the known collection posterior to the bladder and the rectum likely the site of the fistula extending to the colon. A surgical clip is noted in this region and the fat plane is more blurred when compared with the prior CT examination from 11/07/2019. The remainder of the visualized bowel appears within normal limits. Vascular/Lymphatic: Atherosclerotic calcifications are noted without aneurysmal dilatation. Reproductive:  Prostate appears within normal limits. Other: As described above, there is a large abscess interposed between the rectum and the decompressed bladder which was seen on the prior ultrasound examination but originally thought to represent thrombus within the bladder as opposed to a posterior collection in the pelvis. Musculoskeletal: No acute bony abnormality is noted. IMPRESSION: Large air-fluid collection consistent with abscess interposed between the posterior wall of the bladder in the anterior wall of the rectum. Some solid material is noted within likely representing thrombus. Direct fistulization from the bladder into this collection is noted with subsequent passage of contrast material into the rectosigmoid  with an area of soft tissue in the fat plane between the collection and the rectum likely the site of the fistula into the colon. Left ureteral stent is noted. Critical Value/emergent results were called by telephone at the time of interpretation on 11/14/2019 at 11:37 am to Dr. Raynelle Bring , who verbally acknowledged these results. Electronically Signed   By: Inez Catalina M.D.   On: 11/14/2019 11:37   DG C-Arm 1-60 Min-No Report  Result Date: 11/14/2019 Fluoroscopy was utilized by the requesting physician.  No radiographic  interpretation.     LOS: 7 days   Antonieta Pert, MD [facility]  11/15/2019, 1:07 PM

## 2019-11-15 NOTE — Progress Notes (Signed)
Nutrition Follow-up  DOCUMENTATION CODES:   Severe malnutrition in context of chronic illness, Underweight  INTERVENTION:   -[employer] 1.4 PO BID, each provides 455 kcals and 20g protein  NUTRITION DIAGNOSIS:   Severe Malnutrition related to chronic illness as evidenced by severe fat depletion, severe muscle depletion.  Ongoing.  GOAL:   Patient will meet greater than or equal to 90% of their needs   MONITOR:   PO intake, Supplement acceptance, Labs, Weight trends  ASSESSMENT:   54 year-old male with medical history of BPH with chronic indwelling foley catheter, recent CVA (admitted 7/18-7/27), CKD, chronic pain, and Hodgkin's lymphoma (treated with ABVD and XRT). He presented from rehab facility with low BP and fever.  8/15: s/p Cystoscopy, cystogram, bilateral retrograde pyelogram, left ureteral stent, fulguration of the bladder  Patient currently NPO today for stent placement via IR. Pt consuming 50% of meals prior to diet change. Accepting [employer] [place] currently.  Medications: Ferrous sulfate, Multivitamin with minerals daily, Lactated ringers  Labs reviewed: CBGs: 77-87  Diet Order:   Diet Order            Diet NPO time specified Except for: Sips with Meds  Diet effective midnight                 EDUCATION NEEDS:   No education needs have been identified at this time  Skin:  Skin Assessment: Skin Integrity Issues: Skin Integrity Issues:: Stage I Stage I: coccyx (per WOC note)  Last BM:  8/14 -type 6  Height:   Ht Readings from Last 1 Encounters:  11/07/19 5\' 8"  (1.727 m)    Weight:   Wt Readings from Last 1 Encounters:  11/15/19 54.8 kg   BMI:  Body mass index is 18.37 kg/m.  Estimated Nutritional Needs:   Kcal:  2100-2300 kcal  Protein:  100-110 grams  Fluid:  >/= 2.2 L/day  Clayton Bibles, MS, RD, LDN Inpatient Clinical Dietitian Contact information available via Amion

## 2019-11-16 ENCOUNTER — Inpatient Hospital Stay (HOSPITAL_COMMUNITY): Payer: No Typology Code available for payment source

## 2019-11-16 HISTORY — PX: IR NEPHROSTOMY PLACEMENT RIGHT: IMG6064

## 2019-11-16 LAB — GLUCOSE, CAPILLARY
Glucose-Capillary: 76 mg/dL (ref 70–99)
Glucose-Capillary: 73 mg/dL (ref 70–99)

## 2019-11-16 LAB — CBC
HCT: 25.5 % — ABNORMAL LOW (ref 39.0–52.0)
Hemoglobin: 7.7 g/dL — ABNORMAL LOW (ref 13.0–17.0)
MCH: 29.3 pg (ref 26.0–34.0)
MCHC: 30.2 g/dL (ref 30.0–36.0)
MCV: 97 fL (ref 80.0–100.0)
Platelets: 139 10*3/uL — ABNORMAL LOW (ref 150–400)
RBC: 2.63 MIL/uL — ABNORMAL LOW (ref 4.22–5.81)
RDW: 19.9 % — ABNORMAL HIGH (ref 11.5–15.5)
WBC: 7.9 10*3/uL (ref 4.0–10.5)
nRBC: 0 % (ref 0.0–0.2)

## 2019-11-16 MED ORDER — MIDAZOLAM HCL 2 MG/2ML IJ SOLN
INTRAMUSCULAR | Status: AC
  Filled 2019-11-16: qty 4

## 2019-11-16 MED ORDER — CEFAZOLIN SODIUM-DEXTROSE 2-4 GM/100ML-% IV SOLN
INTRAVENOUS | Status: AC
  Administered 2019-11-16: 2 g via INTRAVENOUS
  Filled 2019-11-16: qty 100

## 2019-11-16 MED ORDER — LIDOCAINE HCL (PF) 1 % IJ SOLN
INTRAMUSCULAR | Status: DC | PRN
  Administered 2019-11-16: 10 mL

## 2019-11-16 MED ORDER — FENTANYL CITRATE (PF) 100 MCG/2ML IJ SOLN
INTRAMUSCULAR | Status: DC | PRN
  Administered 2019-11-16 (×2): 50 ug via INTRAVENOUS

## 2019-11-16 MED ORDER — MIDAZOLAM HCL 2 MG/2ML IJ SOLN
INTRAMUSCULAR | Status: DC | PRN
  Administered 2019-11-16 (×2): 1 mg via INTRAVENOUS

## 2019-11-16 MED ORDER — IOHEXOL 300 MG/ML  SOLN
50.0000 mL | Freq: Once | INTRAMUSCULAR | Status: AC | PRN
  Administered 2019-11-16: 10 mL

## 2019-11-16 MED ORDER — MORPHINE SULFATE (PF) 2 MG/ML IV SOLN
2.0000 mg | INTRAVENOUS | Status: DC | PRN
  Administered 2019-11-16 – 2019-11-19 (×14): 2 mg via INTRAVENOUS
  Filled 2019-11-16 (×14): qty 1

## 2019-11-16 MED ORDER — LIDOCAINE HCL 1 % IJ SOLN
INTRAMUSCULAR | Status: AC
  Filled 2019-11-16: qty 20

## 2019-11-16 MED ORDER — CEFAZOLIN SODIUM-DEXTROSE 2-4 GM/100ML-% IV SOLN
2.0000 g | Freq: Once | INTRAVENOUS | Status: AC
  Filled 2019-11-16: qty 100

## 2019-11-16 MED ORDER — FENTANYL CITRATE (PF) 100 MCG/2ML IJ SOLN
INTRAMUSCULAR | Status: AC
  Filled 2019-11-16: qty 2

## 2019-11-16 NOTE — Procedures (Signed)
Interventional Radiology Procedure Note  Procedure: Right 69F PCN placed.   Complications: None  Estimated Blood Loss: None  Recommendations: - Tube to bag - Record output     Signed,  Criselda Peaches, MD

## 2019-11-16 NOTE — Progress Notes (Signed)
2 Days Post-Op    CC: Fever/hypotension  Subjective: Has some abdominal discomfort.  IR drain is putting out a brownish purulent fluid it looks like there is about 2 ounces in the bag.  He is also hungry, he was not fed last evening after his IR procedure.  Awaiting new IR procedure this a.m.  Objective: Vital signs in last 24 hours: Temp:  [97.6 F (36.4 C)-98.7 F (37.1 C)] 97.6 F (36.4 C) (08/17 0308) Pulse Rate:  [54-76] 54 (08/17 0308) Resp:  [14-17] 14 (08/17 0308) BP: (107-121)/(65-75) 110/66 (08/17 0308) SpO2:  [95 %-99 %] 97 % (08/17 0308) Last BM Date: 11/13/19 Intake 340 recorded, it does not say from where. 3525 urine recorded Afebrile vital signs are stable WBC 7.9 H/H 7.7/25.5 Platelets 139,000 Abdominal abscess drain Gram Stain ABUNDANT WBC PRESENT, PREDOMINANTLY PMN  FEW GRAM NEGATIVE RODS  RARE GRAM POSITIVE COCCI IN PAIRS     Intake/Output from previous day: 08/16 0701 - 08/17 0700 In: 340  Out: 7169 [Urine:3525] Intake/Output this shift: No intake/output data recorded.  General appearance: alert, cooperative and no distress GI: Soft, sore, IR drain in place, Foley shows ongoing hematuria  Lab Results:  Recent Labs    11/15/19 0341 11/16/19 0337  WBC 8.1 7.9  HGB 7.3* 7.7*  HCT 23.9* 25.5*  PLT 126* 139*    BMET Recent Labs    11/14/19 0333 11/15/19 0341  NA 143 135  K 4.4 4.7  CL 114* 108  CO2 22 20*  GLUCOSE 90 89  BUN 46* 43*  CREATININE 2.52* 2.53*  CALCIUM 7.4* 7.3*   PT/INR No results for input(s): LABPROT, INR in the last 72 hours.  Recent Labs  Lab 11/10/19 0932  AST 17  ALT 41  ALKPHOS 111  BILITOT 0.4  PROT 5.6*  ALBUMIN 1.8*     Lipase  No results found for: LIPASE   Medications: . Chlorhexidine Gluconate Cloth  6 each Topical Daily  . DULoxetine  60 mg Oral Daily  . feeding supplement ([employer] STANDARD 1.4)  325 mL Oral BID BM  . ferrous sulfate  325 mg Oral Q breakfast  . gabapentin  300 mg Oral  BID  . insulin aspart  0-9 Units Subcutaneous TID WC  . midodrine  5 mg Oral TID AC  . multivitamin with minerals  1 tablet Oral Daily  . pantoprazole  40 mg Oral BID  . tamsulosin  0.4 mg Oral Daily   . ceFEPime (MAXIPIME) IV 2 g (11/15/19 0835)  . 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 Raynelle Bring, POD #2 IR drain placement pelvic abscess 11/15/19 Urinary retention -Foley in place Bilateral ureteral obstruction/left renal stent plan IR nephrostomy rescheduled for today   FEN: IV fluids/n.p.o. ID: Rocephin 8/9/-8/10; Maxipime 8/8 x 1,  8/11>> day 5 DVT: SCDs/off heparin for hematuria  Plan: Continued medical management for now.  He will eventually need a colonoscopy, continue working on his nutrition, and after his current infection has resolved scheduled for elective  surgery.      LOS: 8 days    Haille Pardi 11/16/2019 Please see Amion

## 2019-11-16 NOTE — Progress Notes (Signed)
PROGRESS NOTE    Chase [name]  GEZ:[id number] DOB: 04-Jun-1965 DOA: 11/07/2019 PCP: [name]   Chief Complaint  Patient presents with  . 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  Initially found to be in septic shock from urosepsis started on pressors, Urine culture with E coli and pseudomonas and Blood culture with E coli and proteus, has been on antibiotics then transition to cefepime. He  Reports he had hematuria from traumatic pulling of foley when it got caught during bed transfer. But he reports he has had hematuria before. 8/13 seen by urology and renal ultrasound shows "Large avascular heterogeneous intraluminal mass within the urinary bladder measuring up to 9.4 cm. Findings are most compatible with a large intraluminal blood clot in the setting of hematuria. Follow-up to resolution is recommended to exclude the presence of an underlying malignancy." 8/15-underwent cystoscopy, bilateral retrograde pyelogram with left ureteral stent placement and fulguration of bladder, found to have bilateral hydronephrosis and probable colovesical fistula.  Subjective: uset about NPO, rt abd discomfort. Pelvic drain draining well- brownish pus No nausea vomiting chest pain  Assessment & Plan:  Septic shock 2/2 UTI, Urine culture with E coli and pseudomonas and Blood culture with E coli and proteus:Shock physiology resolved. On midodrine.On Cefepime and will continue the same. 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 Status post pelvic abscess drain placement by IR 8/16 and awaiting for nephrostomy tube placement on the right today appreciate urology and surgery input.  AKI  on CKD stage IIIa with chronic urine retention in the setting of BPH. Baseline creatinine 2.3 peaked to 3.0.He does have bilateral hydronephrosis, left ureteric stent placed and awaiting rt  right nephrostomy tubeby IR .obstructive uropathy may be playing some role in his CKD including sepsis physiology for AKI. Recent Labs  Lab 11/11/19 0821 11/12/19 0358 11/13/19 0322 11/14/19 0333 11/15/19 0341  BUN 62* 54* 50* 46* 43*  CREATININE 3.02* 2.82* 2.57* 2.52* 2.53*   Possible Colovesical fistula with large abscess between bladder and colon:s/p IR pelvic drain 8/16.  Acute blood loss anemia due to hematuria in the setting of chronic anemia. Hemoglobin dropped as low as 6.2 needing 1 unit PRBC. Hb slowly downtrending monitor closely and transfuse. Recent Labs  Lab 11/12/19 0358 11/13/19 0322 11/14/19 0333 11/15/19 0341 11/16/19 0337  HGB 7.9* 7.6* 7.5* 7.3* 7.7*   Recent CVA in July 2021 on aspirin: Unable to move his toes on the left, right side stable. Continue PT OT as tolerated  Chronic pain: Continue home Norco, Cymbalta and Neurontin.   Adult failure to thrive with history of gastric bypass, sacral stage II pressure ulcer no signs of infection.  Augment nutritional status.  PT OT evaluation   GERD continue PPI..  DVT prophylaxis: Place and maintain sequential compression device Start: 11/10/19 1028 Code Status:   Code Status: Full Code  Family Communication: plan of care discussed with patient at bedside.  Status is: Inpatient  Remains inpatient appropriate because:IV treatments appropriate due to intensity of illness or inability to take PO and Inpatient level of care appropriate due to severity of illness   Dispo: The patient is from: SNF  Anticipated d/c is to: SNF              Anticipated d/c date is: 2 days              Patient currently is not medically stable to d/c.  Remains hospitalized for ongoing treatment of his renal failure sepsis,  abscess  Nutrition: Diet Order            Diet NPO time specified Except for: Sips with Meds  Diet effective midnight                 Nutrition Problem: Severe Malnutrition Etiology: chronic illness Signs/Symptoms: severe fat depletion, severe muscle depletion Interventions: Other (Comment) [employer]) Body mass index is 18.37 kg/m.  Consultants:see note  Procedures:see note Microbiology:see note Blood Culture    Component Value Date/Time   SDES  11/15/2019 1809    ABDOMEN ABSCESS Performed at [facility], [place] [address] [place], [place] [address]    SPECREQUEST  11/15/2019 1809    NONE Performed at [facility], [place] [address] [place], [place] [address]    CULT PENDING 11/15/2019 1809   REPTSTATUS PENDING 11/15/2019 1809    Other culture-see note  Medications: Scheduled Meds: . Chlorhexidine Gluconate Cloth  6 each Topical Daily  . DULoxetine  60 mg Oral Daily  . feeding supplement (KATE FARMS STANDARD 1.4)  325 mL Oral BID BM  . ferrous sulfate  325 mg Oral Q breakfast  . gabapentin  300 mg Oral BID  . insulin aspart  0-9 Units Subcutaneous TID WC  . midodrine  5 mg Oral TID AC  . multivitamin with minerals  1 tablet Oral Daily  . pantoprazole  40 mg Oral BID  . tamsulosin  0.4 mg Oral Daily   Continuous Infusions: . ceFEPime (MAXIPIME) IV 2 g (11/16/19 0834)  . lactated ringers 75 mL/hr at 11/15/19 0230    Antimicrobials: Anti-infectives (From admission, onward)   Start     Dose/Rate Route Frequency Ordered Stop   11/10/19 0830  ceFEPIme (MAXIPIME) 2 g in sodium chloride 0.9 % 100 mL IVPB     Discontinue     2 g 200 mL/hr over 30 Minutes Intravenous Every 24 hours 11/10/19 0815     11/08/19 2000  ceFEPIme (MAXIPIME) 2 g in sodium chloride 0.9 % 100 mL IVPB  Status:  Discontinued        2 g 200 mL/hr over 30 Minutes Intravenous Every 24 hours 11/08/19 0025 11/08/19 1037   11/08/19 2000  vancomycin (VANCOREADY)  IVPB 500 mg/100 mL  Status:  Discontinued        500 mg 100 mL/hr over 60 Minutes Intravenous Every 24 hours 11/08/19 0027 11/08/19 1037   11/08/19 1800  cefTRIAXone (ROCEPHIN) 2 g in sodium chloride 0.9 % 100 mL IVPB  Status:  Discontinued        2 g 200 mL/hr over 30 Minutes Intravenous Every 24 hours 11/08/19 1037 11/10/19 0815   11/08/19 0300  ceFEPIme (MAXIPIME) 1 g in sodium chloride 0.9 % 100 mL IVPB  Status:  Discontinued        1 g 200 mL/hr over 30 Minutes Intravenous Every 12 hours 11/08/19 0021 11/08/19 0025   11/07/19 1930  vancomycin (VANCOREADY) IVPB 1250 mg/250 mL        1,250 mg 166.7 mL/hr over 90 Minutes Intravenous  Once 11/07/19 1858 11/07/19 2154   11/07/19 1900  ceFEPIme (MAXIPIME) 2 g in sodium  chloride 0.9 % 100 mL IVPB        2 g 200 mL/hr over 30 Minutes Intravenous  Once 11/07/19 1854 11/07/19 2009   11/07/19 1900  metroNIDAZOLE (FLAGYL) IVPB 500 mg        500 mg 100 mL/hr over 60 Minutes Intravenous  Once 11/07/19 1854 11/07/19 2325   11/07/19 1900  vancomycin (VANCOCIN) IVPB 1000 mg/200 mL premix  Status:  Discontinued        1,000 mg 200 mL/hr over 60 Minutes Intravenous  Once 11/07/19 1854 11/07/19 1858       Objective: Vitals: Today's Vitals   11/15/19 1831 11/15/19 1959 11/15/19 2000 11/16/19 0308  BP:  115/72  110/66  Pulse:  61  (!) 54  Resp:  15  14  Temp:  97.7 F (36.5 C)  97.6 F (36.4 C)  TempSrc:  Oral  Oral  SpO2:  99%  97%  Weight:      Height:      PainSc: Asleep  10-Worst pain ever     Intake/Output Summary (Last 24 hours) at 11/16/2019 0947 Last data filed at 11/16/2019 9326 Gross per 24 hour  Intake 340 ml  Output 3525 ml  Net -3185 ml   Filed Weights   11/15/19 0555  Weight: 54.8 kg   Weight change:    Intake/Output from previous day: 08/16 0701 - 08/17 0700 In: 340  Out: 7124 [Urine:3525] Intake/Output this shift: No intake/output data recorded.  Examination: General exam: AAOx3 , NAD, weak  appearing. HEENT:Oral mucosa moist, Ear/Nose WNL grossly, dentition normal. Respiratory system: bilaterally clear,no wheezing or crackles,no use of accessory muscle Cardiovascular system: S1 & S2 +, No JVD,. Gastrointestinal system: Abdomen soft, NT,ND, BS+ Nervous System:Alert, awake, moving extremities with only able to move toes on left leg, Extremities:No edema, distal peripheral pulses palpable.  Skin:No rashes,no icterus. PYK:DXIPJA muscle bulk,tone, power Pelvic drain + foley +  Data Reviewed: I have personally reviewed following labs and imaging studies CBC: Recent Labs  Lab 11/12/19 0358 11/13/19 0322 11/14/19 0333 11/15/19 0341 11/16/19 0337  WBC 3.9* 6.4 9.4 8.1 7.9  HGB 7.9* 7.6* 7.5* 7.3* 7.7*  HCT 25.3* 24.7* 24.6* 23.9* 25.5*  MCV 94.1 96.1 97.2 97.2 97.0  PLT 97* 96* 116* 126* 250*   Basic Metabolic Panel: Recent Labs  Lab 11/11/19 0821 11/12/19 0358 11/13/19 0322 11/14/19 0333 11/15/19 0341  NA 139 143 135 143 135  K 3.6 3.9 3.4* 4.4 4.7  CL 109 113* 109 114* 108  CO2 21* 18* 18* 22 20*  GLUCOSE 110* 79 122* 90 89  BUN 62* 54* 50* 46* 43*  CREATININE 3.02* 2.82* 2.57* 2.52* 2.53*  CALCIUM 7.4* 7.0* 6.8* 7.4* 7.3*  MG 2.4 2.2 2.1 2.0 1.9   GFR: Estimated Creatinine Clearance: 25.6 mL/min (A) (by C-G formula based on SCr of 2.53 mg/dL (H)). Liver Function Tests: Recent Labs  Lab 11/10/19 0932  AST 17  ALT 41  ALKPHOS 111  BILITOT 0.4  PROT 5.6*  ALBUMIN 1.8*   No results for input(s): LIPASE, AMYLASE in the last 168 hours. No results for input(s): AMMONIA in the last 168 hours. Coagulation Profile: Recent Labs  Lab 11/10/19 1052  INR 1.3*   Cardiac Enzymes: No results for input(s): CKTOTAL, CKMB, CKMBINDEX, TROPONINI in the last 168 hours. BNP (last 3 results) No results for input(s): PROBNP in the last 8760 hours. HbA1C: No results for input(s): HGBA1C in the last 72 hours. CBG: Recent Labs  Lab 11/15/19  5053 11/15/19 1127  11/15/19 1845 11/15/19 2152 11/16/19 0758  GLUCAP 87 77 80 158* 76   Lipid Profile: No results for input(s): CHOL, HDL, LDLCALC, TRIG, CHOLHDL, LDLDIRECT in the last 72 hours. Thyroid Function Tests: No results for input(s): TSH, T4TOTAL, FREET4, T3FREE, THYROIDAB in the last 72 hours. Anemia Panel: Recent Labs    11/15/19 0341  VITAMINB12 298  FOLATE 3.2*  FERRITIN 103  TIBC 191*  IRON 36*  RETICCTPCT 2.3   Sepsis Labs: No results for input(s): PROCALCITON, LATICACIDVEN in the last 168 hours.  Recent Results (from the past 240 hour(s))  Culture, blood (Routine x 2)     Status: Abnormal   Collection Time: 11/07/19  6:36 PM   Specimen: BLOOD  Result Value Ref Range Status   Specimen Description   Final    BLOOD RIGHT ANTECUBITAL Performed at [place] [address], [place], [place] [address]    Special Requests   Final    BOTTLES DRAWN AEROBIC AND ANAEROBIC Blood Culture adequate volume Performed at [facility] [address], [place], [place] [address]    Culture  Setup Time   Final    GRAM NEGATIVE RODS IN BOTH AEROBIC AND ANAEROBIC BOTTLES CRITICAL RESULT CALLED TO, READ BACK BY AND VERIFIED WITH: Guadlupe Spanish PharmD 10:00 11/08/19 ([email or web address]) Performed at [facility], [address], [place], [place] [address]    Culture ESCHERICHIA COLI PROTEUS MIRABILIS  (A)  Final   Report Status 11/10/2019 FINAL  Final   Organism ID, Bacteria ESCHERICHIA COLI  Final   Organism ID, Bacteria PROTEUS MIRABILIS  Final      Susceptibility   Escherichia coli - MIC*    AMPICILLIN <=2 SENSITIVE Sensitive     CEFAZOLIN <=4 SENSITIVE Sensitive     CEFEPIME <=0.12 SENSITIVE Sensitive     CEFTAZIDIME <=1 SENSITIVE Sensitive     CEFTRIAXONE <=0.25 SENSITIVE Sensitive     CIPROFLOXACIN <=0.25 SENSITIVE Sensitive     GENTAMICIN <=1 SENSITIVE Sensitive     IMIPENEM <=0.25 SENSITIVE Sensitive     TRIMETH/SULFA <=20 SENSITIVE Sensitive      AMPICILLIN/SULBACTAM <=2 SENSITIVE Sensitive     PIP/TAZO <=4 SENSITIVE Sensitive     * ESCHERICHIA COLI   Proteus mirabilis - MIC*    AMPICILLIN <=2 SENSITIVE Sensitive     CEFAZOLIN <=4 SENSITIVE Sensitive     CEFEPIME <=0.12 SENSITIVE Sensitive     CEFTAZIDIME <=1 SENSITIVE Sensitive     CEFTRIAXONE <=0.25 SENSITIVE Sensitive     CIPROFLOXACIN <=0.25 SENSITIVE Sensitive     GENTAMICIN <=1 SENSITIVE Sensitive     IMIPENEM 2 SENSITIVE Sensitive     TRIMETH/SULFA <=20 SENSITIVE Sensitive     AMPICILLIN/SULBACTAM <=2 SENSITIVE Sensitive     PIP/TAZO <=4 SENSITIVE Sensitive     * PROTEUS MIRABILIS  Blood Culture ID Panel (Reflexed)     Status: Abnormal   Collection Time: 11/07/19  6:36 PM  Result Value Ref Range Status   Enterococcus faecalis NOT DETECTED NOT DETECTED Final   Enterococcus Faecium NOT DETECTED NOT DETECTED Final   Listeria monocytogenes NOT DETECTED NOT DETECTED Final   Staphylococcus species NOT DETECTED NOT DETECTED Final   Staphylococcus aureus (BCID) NOT DETECTED NOT DETECTED Final   Staphylococcus epidermidis NOT DETECTED NOT DETECTED Final   Staphylococcus lugdunensis NOT DETECTED NOT DETECTED Final   Streptococcus species NOT DETECTED NOT DETECTED Final   Streptococcus agalactiae NOT DETECTED NOT DETECTED Final   Streptococcus pneumoniae  NOT DETECTED NOT DETECTED Final   Streptococcus pyogenes NOT DETECTED NOT DETECTED Final   A.calcoaceticus-baumannii NOT DETECTED NOT DETECTED Final   Bacteroides fragilis NOT DETECTED NOT DETECTED Final   Enterobacterales DETECTED (A) NOT DETECTED Final    Comment: CRITICAL RESULT CALLED TO, READ BACK BY AND VERIFIED WITH: Guadlupe Spanish PharmD 10:00 11/08/19 ([email or web address])    Enterobacter cloacae complex NOT DETECTED NOT DETECTED Final   Escherichia coli DETECTED (A) NOT DETECTED Final    Comment: CRITICAL RESULT CALLED TO, READ BACK BY AND VERIFIED WITH: Guadlupe Spanish PharmD 10:00 11/08/19 ([email or web address])    Klebsiella aerogenes NOT  DETECTED NOT DETECTED Final   Klebsiella oxytoca NOT DETECTED NOT DETECTED Final   Klebsiella pneumoniae NOT DETECTED NOT DETECTED Final   Proteus species DETECTED (A) NOT DETECTED Final    Comment: CRITICAL RESULT CALLED TO, READ BACK BY AND VERIFIED WITH: Guadlupe Spanish PharmD 10:00 11/08/19 ([email or web address])    Salmonella species NOT DETECTED NOT DETECTED Final   Serratia marcescens NOT DETECTED NOT DETECTED Final   Haemophilus influenzae NOT DETECTED NOT DETECTED Final   Neisseria meningitidis NOT DETECTED NOT DETECTED Final   Pseudomonas aeruginosa NOT DETECTED NOT DETECTED Final   Stenotrophomonas maltophilia NOT DETECTED NOT DETECTED Final   Candida albicans NOT DETECTED NOT DETECTED Final   Candida auris NOT DETECTED NOT DETECTED Final   Candida glabrata NOT DETECTED NOT DETECTED Final   Candida krusei NOT DETECTED NOT DETECTED Final   Candida parapsilosis NOT DETECTED NOT DETECTED Final   Candida tropicalis NOT DETECTED NOT DETECTED Final   Cryptococcus neoformans/gattii NOT DETECTED NOT DETECTED Final   CTX-M ESBL NOT DETECTED NOT DETECTED Final   Carbapenem resistance IMP NOT DETECTED NOT DETECTED Final   Carbapenem resistance KPC NOT DETECTED NOT DETECTED Final   Carbapenem resistance NDM NOT DETECTED NOT DETECTED Final   Carbapenem resist OXA 48 LIKE NOT DETECTED NOT DETECTED Final   Carbapenem resistance VIM NOT DETECTED NOT DETECTED Final    Comment: Performed at [facility], [place]. [address], [place], [place] [address]  Culture, blood (Routine x 2)     Status: Abnormal   Collection Time: 11/07/19  6:41 PM   Specimen: BLOOD  Result Value Ref Range Status   Specimen Description   Final    BLOOD LEFT ANTECUBITAL Performed at [facility] [address], [place], [place] [address]    Special Requests   Final    BOTTLES DRAWN AEROBIC AND ANAEROBIC Blood Culture adequate volume Performed at [place] [address], [place],  [place] [address]    Culture  Setup Time   Final    GRAM NEGATIVE RODS IN BOTH AEROBIC AND ANAEROBIC BOTTLES CRITICAL VALUE NOTED.  VALUE IS CONSISTENT WITH PREVIOUSLY REPORTED AND CALLED VALUE.    Culture (A)  Final    ESCHERICHIA COLI SUSCEPTIBILITIES PERFORMED ON PREVIOUS CULTURE WITHIN THE LAST 5 DAYS. Performed at [facility], [place] [address], [place], [place] [address]    Report Status 11/10/2019 FINAL  Final  Urine culture     Status: Abnormal   Collection Time: 11/07/19  6:45 PM   Specimen: Urine, Random  Result Value Ref Range Status   Specimen Description   Final    URINE, RANDOM Performed at [place] [address] [place], [place] [address]    Special Requests   Final    NONE Performed at [facility], [place] [address], [place], [place] [address]  Culture (A)  Final    70,000 COLONIES/mL ESCHERICHIA COLI >=100,000 COLONIES/mL PSEUDOMONAS AERUGINOSA    Report Status 11/10/2019 FINAL  Final   Organism ID, Bacteria ESCHERICHIA COLI (A)  Final   Organism ID, Bacteria PSEUDOMONAS AERUGINOSA (A)  Final      Susceptibility   Escherichia coli - MIC*    AMPICILLIN <=2 SENSITIVE Sensitive     CEFAZOLIN <=4 SENSITIVE Sensitive     CEFTRIAXONE <=0.25 SENSITIVE Sensitive     CIPROFLOXACIN <=0.25 SENSITIVE Sensitive     GENTAMICIN <=1 SENSITIVE Sensitive     IMIPENEM <=0.25 SENSITIVE Sensitive     NITROFURANTOIN <=16 SENSITIVE Sensitive     TRIMETH/SULFA <=20 SENSITIVE Sensitive     AMPICILLIN/SULBACTAM <=2 SENSITIVE Sensitive     PIP/TAZO <=4 SENSITIVE Sensitive     * 70,000 COLONIES/mL ESCHERICHIA COLI   Pseudomonas aeruginosa - MIC*    CEFTAZIDIME 4 SENSITIVE Sensitive     CIPROFLOXACIN 0.5 SENSITIVE Sensitive     GENTAMICIN <=1 SENSITIVE Sensitive     IMIPENEM 1 SENSITIVE Sensitive     PIP/TAZO 8 SENSITIVE Sensitive     CEFEPIME 2 SENSITIVE Sensitive     * >=100,000 COLONIES/mL PSEUDOMONAS AERUGINOSA  SARS Coronavirus 2  by RT PCR (hospital order, performed in [place] hospital lab) Nasopharyngeal Nasopharyngeal Swab     Status: None   Collection Time: 11/07/19  7:08 PM   Specimen: Nasopharyngeal Swab  Result Value Ref Range Status   SARS Coronavirus 2 NEGATIVE NEGATIVE Final    Comment: (NOTE) SARS-CoV-2 target nucleic acids are NOT DETECTED.  The SARS-CoV-2 RNA is generally detectable in upper and lower respiratory specimens during the acute phase of infection. The lowest concentration of SARS-CoV-2 viral copies this assay can detect is 250 copies / mL. A negative result does not preclude SARS-CoV-2 infection and should not be used as the sole basis for treatment or other patient management decisions.  A negative result may occur with improper specimen collection / handling, submission of specimen other than nasopharyngeal swab, presence of viral mutation(s) within the areas targeted by this assay, and inadequate number of viral copies (<250 copies / mL). A negative result must be combined with clinical observations, patient history, and epidemiological information.  Fact Sheet for Patients:   StrictlyIdeas.no  Fact Sheet for Healthcare Providers: BankingDealers.co.za  This test is not yet approved or  cleared by the [place] FDA and has been authorized for detection and/or diagnosis of SARS-CoV-2 by FDA under an Emergency Use Authorization (EUA).  This EUA will remain in effect (meaning this test can be used) for the duration of the COVID-19 declaration under Section 564(b)(1) of the Act, 21 U.S.C. section 360bbb-3(b)(1), unless the authorization is terminated or revoked sooner.  Performed at [facility], [place] [address], [place], [place] [address]   MRSA PCR Screening     Status: None   Collection Time: 11/08/19  2:35 AM   Specimen: Nasal Mucosa; Nasopharyngeal  Result Value Ref Range Status   MRSA by PCR NEGATIVE  NEGATIVE Final    Comment:        The GeneXpert MRSA Assay (FDA approved for NASAL specimens only), is one component of a comprehensive MRSA colonization surveillance program. It is not intended to diagnose MRSA infection nor to guide or monitor treatment for MRSA infections. Performed at [facility], [place] [address], [place], [place] [address]   Aerobic/Anaerobic Culture (surgical/deep wound)     Status: None (Preliminary result)  Collection Time: 11/15/19  6:09 PM   Specimen: Abdomen; Abscess  Result Value Ref Range Status   Specimen Description   Final    ABDOMEN ABSCESS Performed at [facility] [address], [place], [place] [address]    Special Requests   Final    NONE Performed at [facility], [place] [address] [place], [place] [address]    Gram Stain   Final    ABUNDANT WBC PRESENT, PREDOMINANTLY PMN FEW GRAM NEGATIVE RODS RARE GRAM POSITIVE COCCI IN PAIRS Performed at [facility], [place] [address], [place], [place] [address]    Culture PENDING  Incomplete   Report Status PENDING  Incomplete      Radiology Studies: CT PELVIS WO CONTRAST  Result Date: 11/14/2019 CLINICAL DATA:  Possible colovesical fistula EXAM: CT PELVIS WITHOUT CONTRAST TECHNIQUE: Multidetector CT imaging of the pelvis was performed following the standard protocol without intravenous contrast. Additionally a subsequent images were obtained following installation of dilute contrast material into the urinary bladder via the indwelling Foley catheter. COMPARISON:  Ultrasound from 11/12/2019, CT from 11/07/2019 and recent cystoscopy films from earlier in the same day. FINDINGS: Urinary Tract: The initial images obtained through the pelvis demonstrate the Foley catheter in a relatively decompressed urinary bladder displaced anteriorly. A left ureteral stent is noted in place and there is contrast material within the right ureter related to the  recent retrograde pyelogram. The bladder is significantly thickened circumferentially and a large fluid collection is noted with dependent density within which measures approximately 9.5 x 8.5 cm in greatest transverse and AP dimensions respectively. This shows considerable contrast within the posterior more solid portion with extension towards the colon as well as a considerable amount of contrast within the rectosigmoid related to the recent injection during cystoscopy. Subsequently the bladder was instilled with contrast material which distended the bladder somewhat and better delineated the walls of the bladder. Considerable trabeculation is noted. There is a rent identified in the posterior aspect of the bladder which corresponds with that seen on cystoscopy with extension of contrast material into the large abscess identified posteriorly. The more solid portion of this fluid collection may represent some hematoma within the abscess collection. There is irregularity noted posteriorly which extends towards the colon and opacifies the colon although a definitive tract between the colon and this collection is not well appreciated on this particular exam. Bowel: The rectosigmoid demonstrates contrast within from prior cystoscopic injection. There is an area with anterior to the rectum where there is loss of the normal fat plane between the known collection posterior to the bladder and the rectum likely the site of the fistula extending to the colon. A surgical clip is noted in this region and the fat plane is more blurred when compared with the prior CT examination from 11/07/2019. The remainder of the visualized bowel appears within normal limits. Vascular/Lymphatic: Atherosclerotic calcifications are noted without aneurysmal dilatation. Reproductive:  Prostate appears within normal limits. Other: As described above, there is a large abscess interposed between the rectum and the decompressed bladder which was seen  on the prior ultrasound examination but originally thought to represent thrombus within the bladder as opposed to a posterior collection in the pelvis. Musculoskeletal: No acute bony abnormality is noted. IMPRESSION: Large air-fluid collection consistent with abscess interposed between the posterior wall of the bladder in the anterior wall of the rectum. Some solid material is noted within likely representing thrombus. Direct fistulization from the bladder into  this collection is noted with subsequent passage of contrast material into the rectosigmoid with an area of soft tissue in the fat plane between the collection and the rectum likely the site of the fistula into the colon. Left ureteral stent is noted. Critical Value/emergent results were called by telephone at the time of interpretation on 11/14/2019 at 11:37 am to Dr. Raynelle Bring , who verbally acknowledged these results. Electronically Signed   By: Inez Catalina M.D.   On: 11/14/2019 11:37   CT IMAGE GUIDED DRAINAGE BY PERCUTANEOUS CATHETER  Result Date: 11/16/2019 INDICATION: 54 year old male referred for pelvic abscess drainage, with evidence of colovesical fistula EXAM: CT GUIDED DRAINAGE OF  ABSCESS MEDICATIONS: The patient is currently admitted to the hospital and receiving intravenous antibiotics. The antibiotics were administered within an appropriate time frame prior to the initiation of the procedure. ANESTHESIA/SEDATION: 2.0 mg IV Versed 100 mcg IV Fentanyl Moderate Sedation Time:  12 minutes The patient was continuously monitored during the procedure by the interventional radiology nurse under my direct supervision. COMPLICATIONS: None TECHNIQUE: Informed written consent was obtained from the patient after a thorough discussion of the procedural risks, benefits and alternatives. All questions were addressed. Maximal Sterile Barrier Technique was utilized including caps, mask, sterile gowns, sterile gloves, sterile drape, hand hygiene and skin  antiseptic. A timeout was performed prior to the initiation of the procedure. PROCEDURE: The operative field was prepped with Chlorhexidine in a sterile fashion, and a sterile drape was applied covering the operative field. A sterile gown and sterile gloves were used for the procedure. Local anesthesia was provided with 1% Lidocaine. Scout images were acquired in supine position. The patient is prepped and draped in the usual sterile fashion. 1% lidocaine was used for local anesthesia. Using modified Seldinger technique, a 12 French drain was placed into the pelvic abscess just above the bladder. Approximately 300 cc of purulent fluid aspirated. Culture was sent. Catheter was attached to gravity drainage. Patient tolerated the procedure well and remained hemodynamically stable throughout. No complications were encountered and no significant blood loss. FINDINGS: Initial CT demonstrates balloon retention urinary catheter. A left ureteral stent in position. Abscess within the pelvis just superior to the urinary bladder with enteric contrast in the dependent aspect. Air in the anti dependent aspect. Very small window for placement of the drain at the most cephalad aspect of the abscess. Twelve French drain was placed with the abscess decompressed. IMPRESSION: Status post CT-guided drainage of pelvic abscess. Signed, Dulcy Fanny. Dellia Nims, RPVI [facility] Radiology Electronically Signed   By: Corrie Mckusick D.O.   On: 11/16/2019 07:35     LOS: 8 days   Antonieta Pert, MD [facility]  11/16/2019, 9:47 AM

## 2019-11-16 NOTE — Progress Notes (Signed)
Spoke with RN regarding IV access needs. Per RN, no further needs at this time. Consult cleared.

## 2019-11-16 NOTE — Progress Notes (Signed)
Patient ID: Chase [name], male   DOB: Apr 09, 1965, 54 y.o.   MRN: [id number]  2 Days Post-Op Subjective: S/P percutaneous drain of pelvic abscess yesterday.  Doing well.  He is upset about not being able to eat due to so many recent procedures but he understands.  Denies pain.  Objective: Vital signs in last 24 hours: Temp:  [97.6 F (36.4 C)-98.7 F (37.1 C)] 97.6 F (36.4 C) (08/17 0308) Pulse Rate:  [54-76] 54 (08/17 0308) Resp:  [14-17] 14 (08/17 0308) BP: (107-121)/(65-75) 110/66 (08/17 0308) SpO2:  [95 %-99 %] 97 % (08/17 0308)  Intake/Output from previous day: 08/16 0701 - 08/17 0700 In: 340  Out: 3525 [Urine:3525] Intake/Output this shift: No intake/output data recorded.  Physical Exam:  General: Alert and oriented Abdomen: Soft, ND, NT, pelvic drain with gross purulent drainage Ext: NT, No erythema  Lab Results: Recent Labs    11/14/19 0333 11/15/19 0341 11/16/19 0337  HGB 7.5* 7.3* 7.7*  HCT 24.6* 23.9* 25.5*   CBC Latest Ref Rng & Units 11/16/2019 11/15/2019 11/14/2019  WBC 4.0 - 10.5 K/uL 7.9 8.1 9.4  Hemoglobin 13.0 - 17.0 g/dL 7.7(L) 7.3(L) 7.5(L)  Hematocrit 39 - 52 % 25.5(L) 23.9(L) 24.6(L)  Platelets 150 - 400 K/uL 139(L) 126(L) 116(L)     BMET Recent Labs    11/14/19 0333 11/15/19 0341  NA 143 135  K 4.4 4.7  CL 114* 108  CO2 22 20*  GLUCOSE 90 89  BUN 46* 43*  CREATININE 2.52* 2.53*  CALCIUM 7.4* 7.3*     Studies/Results: CT PELVIS WO CONTRAST  Result Date: 11/14/2019 CLINICAL DATA:  Possible colovesical fistula EXAM: CT PELVIS WITHOUT CONTRAST TECHNIQUE: Multidetector CT imaging of the pelvis was performed following the standard protocol without intravenous contrast. Additionally a subsequent images were obtained following installation of dilute contrast material into the urinary bladder via the indwelling Foley catheter. COMPARISON:  Ultrasound from 11/12/2019, CT from 11/07/2019 and recent cystoscopy films from earlier in the same  day. FINDINGS: Urinary Tract: The initial images obtained through the pelvis demonstrate the Foley catheter in a relatively decompressed urinary bladder displaced anteriorly. A left ureteral stent is noted in place and there is contrast material within the right ureter related to the recent retrograde pyelogram. The bladder is significantly thickened circumferentially and a large fluid collection is noted with dependent density within which measures approximately 9.5 x 8.5 cm in greatest transverse and AP dimensions respectively. This shows considerable contrast within the posterior more solid portion with extension towards the colon as well as a considerable amount of contrast within the rectosigmoid related to the recent injection during cystoscopy. Subsequently the bladder was instilled with contrast material which distended the bladder somewhat and better delineated the walls of the bladder. Considerable trabeculation is noted. There is a rent identified in the posterior aspect of the bladder which corresponds with that seen on cystoscopy with extension of contrast material into the large abscess identified posteriorly. The more solid portion of this fluid collection may represent some hematoma within the abscess collection. There is irregularity noted posteriorly which extends towards the colon and opacifies the colon although a definitive tract between the colon and this collection is not well appreciated on this particular exam. Bowel: The rectosigmoid demonstrates contrast within from prior cystoscopic injection. There is an area with anterior to the rectum where there is loss of the normal fat plane between the known collection posterior to the bladder and the rectum likely the site  of the fistula extending to the colon. A surgical clip is noted in this region and the fat plane is more blurred when compared with the prior CT examination from 11/07/2019. The remainder of the visualized bowel appears within  normal limits. Vascular/Lymphatic: Atherosclerotic calcifications are noted without aneurysmal dilatation. Reproductive:  Prostate appears within normal limits. Other: As described above, there is a large abscess interposed between the rectum and the decompressed bladder which was seen on the prior ultrasound examination but originally thought to represent thrombus within the bladder as opposed to a posterior collection in the pelvis. Musculoskeletal: No acute bony abnormality is noted. IMPRESSION: Large air-fluid collection consistent with abscess interposed between the posterior wall of the bladder in the anterior wall of the rectum. Some solid material is noted within likely representing thrombus. 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 Left ureteral stent is noted. Critical Value/emergent results were called by telephone at the time of interpretation on 11/14/2019 at 11:37 am to Dr. Raynelle Bring , who verbally acknowledged these results. Electronically Signed   By: Inez Catalina M.D.   On: 11/14/2019 11:37   DG C-Arm 1-60 Min-No Report  Result Date: 11/14/2019 Fluoroscopy was utilized by the requesting physician.  No radiographic interpretation.   CT IMAGE GUIDED DRAINAGE BY PERCUTANEOUS CATHETER  Result Date: 11/16/2019 INDICATION: 54 year old male referred for pelvic abscess drainage, with evidence of colovesical fistula EXAM: CT GUIDED DRAINAGE OF  ABSCESS MEDICATIONS: The patient is currently admitted to the hospital and receiving intravenous antibiotics. The antibiotics were administered within an appropriate time frame prior to the initiation of the procedure. ANESTHESIA/SEDATION: 2.0 mg IV Versed 100 mcg IV Fentanyl Moderate Sedation Time:  12 minutes The patient was  continuously monitored during the procedure by the interventional radiology nurse under my direct supervision. COMPLICATIONS: None TECHNIQUE: Informed written consent was obtained from the patient after a thorough discussion of the procedural risks, benefits and alternatives. All questions were addressed. Maximal Sterile Barrier Technique was utilized including caps, mask, sterile gowns, sterile gloves, sterile drape, hand hygiene and skin antiseptic. A timeout was performed prior to the initiation of the procedure. PROCEDURE: The operative field was prepped with Chlorhexidine in a sterile fashion, and a sterile drape was applied covering the operative field. A sterile gown and sterile gloves were used for the procedure. Local anesthesia was provided with 1% Lidocaine. Scout images were acquired in supine position. The patient is prepped and draped in the usual sterile fashion. 1% lidocaine was used for local anesthesia. Using modified Seldinger technique, a 12 French drain was placed into the pelvic abscess just above the bladder. Approximately 300 cc of purulent fluid aspirated. Culture was sent. Catheter was attached to gravity drainage. Patient tolerated the procedure well and remained hemodynamically stable throughout. No complications were encountered and no significant blood loss. FINDINGS: Initial CT demonstrates balloon retention urinary catheter. A left ureteral stent in position. Abscess within the pelvis just superior to the urinary bladder with enteric contrast in the dependent aspect. Air in the anti dependent aspect. Very small window for placement of the drain at the most cephalad aspect of the abscess. Twelve French drain was placed with the abscess decompressed. IMPRESSION: Status post CT-guided drainage of pelvic abscess. Signed, Dulcy Fanny. Dellia Nims, [place] [facility] Radiology Electronically Signed   By: Corrie Mckusick D.O.   On: 11/16/2019 07:35     Assessment/Plan: 1) Colovesical fistula with pelvic abscess: Drain placed yesterday with cultures pending.  Expect them to be consistent with cultures from earlier in hospitalization.  Will defer length of antibiotic therapy to general surgery but imagine recommendation will be to continue until drain output is minimal and repeat imaging suggests resolution of abscess.  Will be prepared to assist general surgery with eventual surgical repair although I imagine this will be still in distant future to allow medical optimization and considering recent stroke in July.  2) Bilateral ureteral obstruction: Left stent in place.  He is scheduled for right nephrostomy in IR today.  Will then plan for eventual antegrade stent placement once system is decompressed.  Will leave stents in place until definitive treatment of fistula.  3) Urinary retention:  Leave indwelling catheter until fistula repair.  Will need this changed in one month.   LOS: 8 days   [name] 11/16/2019, 8:00 AM

## 2019-11-17 LAB — BASIC METABOLIC PANEL
Anion gap: 7 (ref 5–15)
BUN: 43 mg/dL — ABNORMAL HIGH (ref 6–20)
CO2: 20 mmol/L — ABNORMAL LOW (ref 22–32)
Calcium: 7.5 mg/dL — ABNORMAL LOW (ref 8.9–10.3)
Chloride: 115 mmol/L — ABNORMAL HIGH (ref 98–111)
Creatinine, Ser: 2.66 mg/dL — ABNORMAL HIGH (ref 0.61–1.24)
GFR calc Af Amer: 30 mL/min — ABNORMAL LOW (ref >60)
GFR calc non Af Amer: 26 mL/min — ABNORMAL LOW (ref >60)
Glucose, Bld: 93 mg/dL (ref 70–99)
Potassium: 4.8 mmol/L (ref 3.5–5.1)
Sodium: 142 mmol/L (ref 135–145)

## 2019-11-17 LAB — CBC
HCT: 24.1 % — ABNORMAL LOW (ref 39.0–52.0)
Hemoglobin: 7.3 g/dL — ABNORMAL LOW (ref 13.0–17.0)
MCH: 29.9 pg (ref 26.0–34.0)
MCHC: 30.3 g/dL (ref 30.0–36.0)
MCV: 98.8 fL (ref 80.0–100.0)
Platelets: 151 10*3/uL (ref 150–400)
RBC: 2.44 MIL/uL — ABNORMAL LOW (ref 4.22–5.81)
RDW: 20.3 % — ABNORMAL HIGH (ref 11.5–15.5)
WBC: 7.8 10*3/uL (ref 4.0–10.5)
nRBC: 0 % (ref 0.0–0.2)

## 2019-11-17 MED ORDER — BOOST / RESOURCE BREEZE PO LIQD CUSTOM
1.0000 | Freq: Four times a day (QID) | ORAL | Status: DC
  Administered 2019-11-17 – 2019-11-19 (×6): 1 via ORAL

## 2019-11-17 NOTE — Progress Notes (Signed)
PROGRESS NOTE    Chase [name]  HBZ:[id number] DOB: 1966/03/25 DOA: 11/07/2019 PCP: [name]   Chief Complaint  Patient presents with  . 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  Initially found to be in septic shock from urosepsis started on pressors, Urine culture with E coli and pseudomonas and Blood culture with E coli and proteus, has been on antibiotics then transition to cefepime. He  Reports he had hematuria from traumatic pulling of foley when it got caught during bed transfer. But he reports he has had hematuria before. 8/13 seen by urology and renal ultrasound shows "Large avascular heterogeneous intraluminal mass within the urinary bladder measuring up to 9.4 cm. Findings are most compatible with a large intraluminal blood clot in the setting of hematuria. Follow-up to resolution is recommended to exclude the presence of an underlying malignancy." 8/15-underwent cystoscopy, bilateral retrograde pyelogram with left ureteral stent placement and fulguration of bladder, found to have bilateral hydronephrosis and probable colovesical fistula.  Subjective:  C//o pain all over in joints, back, pelvis Controlled on morphine and vicodin.  pelvic drain pus slowing down. Rt nephrostomy tube with blood tinged output. On RA. C/o constipation.  Assessment & Plan:  Septic shock 2/2 UTI, Urine culture with E coli and pseudomonas and Blood culture with E coli and proteus:Shock physiology resolved. On midodrine.On Cefepime and will continue the same. 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 Status post pelvic abscess drain placement by IR 8/16 and Rt nephrostomy tube  placement  8/17.  Appreciate urology and surgery input.  Surgery advised minimum 5 days of IV antibiotics.  Pelvic abscess output is slowing down-and culture growing moderate gram-negative rods moderate Pseudomonas aeruginosa- f/u.Per surgery patient is to get through this acute phase and follow-up with the IR drain clinic, will need colonoscopy and then follow-up with [name] for colorectal repair.  AKI on CKD stage IIIa with chronic urine retention in the setting of BPH. Baseline creatinine 2.3 peaked to 3.0.He does have bilateral hydronephrosis, left ureteric stent placed 8/16 and  rt right nephrostomy tubeby IR 8/17- foley +:Urine from both sites  draining well but appears blood tinged. Continue to monitor renal function closely. Recent Labs  Lab 11/12/19 0358 11/13/19 0322 11/14/19 0333 11/15/19 0341 11/17/19 1109  BUN 54* 50* 46* 43* 43*  CREATININE 2.82* 2.57* 2.52* 2.53* 2.66*   Possible Colovesical fistula with large abscess between bladder and colon:s/p IR pelvic drain 8/16.  Patient will need follow-up with [name] once acute phase resolves for colorectal  Repair.  Acute blood loss anemia due to hematuria in the setting of chronic anemia. Hemoglobin dropped as low as 6.2 needing 1 unit PRBC. Hb trending down likely from hematuria.  Monitor closely and transfuse if less than 7 g.  Recent Labs  Lab 11/13/19 0322 11/14/19 0333 11/15/19 0341 11/16/19 0337 11/17/19 1109  HGB 7.6* 7.5* 7.3* 7.7* 7.3*   Recent CVA in July 2021 on aspirin: Unable to move his toes on the left, right side stable. Continue PT OT as tolerated  Chronic pain: cont on norco, Cymbalta and Neurontin. Also getting iv morphine.   Adult failure to thrive with history of gastric bypass, sacral stage II pressure  ulcer no signs of infection.  Augment nutritional status.  PT OT evaluation   GERD continue PPI..  DVT prophylaxis: Place and maintain sequential compression device Start: 11/10/19 1028 Code  Status:   Code Status: Full Code  Family Communication: plan of care discussed with patient at bedside.  Status is: Inpatient  Remains inpatient appropriate because:IV treatments appropriate due to intensity of illness or inability to take PO and Inpatient level of care appropriate due to severity of illness   Dispo: The patient is from: SNF              Anticipated d/c is to: SNF              Anticipated d/c date is: 1-2 days once okay with surgery, IR and Urology.              Patient currently is not medically stable to d/c.  Remains hospitalized for ongoing treatment of his renal failure sepsis, abscess.  Nutrition: Diet Order            Diet regular Room service appropriate? Yes; Fluid consistency: Thin  Diet effective now                 Nutrition Problem: Severe Malnutrition Etiology: chronic illness Signs/Symptoms: severe fat depletion, severe muscle depletion Interventions: Boost Breeze, Refer to RD note for recommendations Body mass index is 18.37 kg/m.  Consultants:see note  Procedures:see note Microbiology:see note Blood Culture    Component Value Date/Time   SDES  11/15/2019 1809    ABDOMEN ABSCESS Performed at [facility], [place] [address] [place], [place] [address]    SPECREQUEST  11/15/2019 1809    NONE Performed at [facility], [place] [address] [place], [place] [address]    CULT  11/15/2019 1809    MODERATE GRAM NEGATIVE RODS MODERATE PSEUDOMONAS AERUGINOSA IDENTIFICATION AND SUSCEPTIBILITIES TO FOLLOW Performed at [facility], [place] [address], [place], [place] [address]    REPTSTATUS PENDING 11/15/2019 1809    Other culture-see note  Medications: Scheduled Meds: . Chlorhexidine Gluconate Cloth  6 each Topical Daily  . DULoxetine  60 mg Oral Daily  . feeding supplement  1 Container Oral QID  . ferrous sulfate  325 mg Oral Q breakfast  . gabapentin  300 mg Oral BID  . midodrine  5 mg Oral TID AC   . multivitamin with minerals  1 tablet Oral Daily  . pantoprazole  40 mg Oral BID  . tamsulosin  0.4 mg Oral Daily   Continuous Infusions: . ceFEPime (MAXIPIME) IV Stopped (11/17/19 0857)  . lactated ringers 75 mL/hr at 11/17/19 0359    Antimicrobials: Anti-infectives (From admission, onward)   Start     Dose/Rate Route Frequency Ordered Stop   11/16/19 1315  ceFAZolin (ANCEF) IVPB 2g/100 mL premix        2 g 200 mL/hr over 30 Minutes Intravenous  Once 11/16/19 1302 11/16/19 1350   11/10/19 0830  ceFEPIme (MAXIPIME) 2 g in sodium chloride 0.9 % 100 mL IVPB     Discontinue     2 g 200 mL/hr over 30 Minutes Intravenous Every 24 hours 11/10/19 0815     11/08/19 2000  ceFEPIme (MAXIPIME) 2 g in sodium chloride 0.9 % 100 mL IVPB  Status:  Discontinued        2 g 200 mL/hr over 30 Minutes Intravenous Every 24 hours 11/08/19 0025 11/08/19 1037   11/08/19 2000  vancomycin (VANCOREADY) IVPB  500 mg/100 mL  Status:  Discontinued        500 mg 100 mL/hr over 60 Minutes Intravenous Every 24 hours 11/08/19 0027 11/08/19 1037   11/08/19 1800  cefTRIAXone (ROCEPHIN) 2 g in sodium chloride 0.9 % 100 mL IVPB  Status:  Discontinued        2 g 200 mL/hr over 30 Minutes Intravenous Every 24 hours 11/08/19 1037 11/10/19 0815   11/08/19 0300  ceFEPIme (MAXIPIME) 1 g in sodium chloride 0.9 % 100 mL IVPB  Status:  Discontinued        1 g 200 mL/hr over 30 Minutes Intravenous Every 12 hours 11/08/19 0021 11/08/19 0025   11/07/19 1930  vancomycin (VANCOREADY) IVPB 1250 mg/250 mL        1,250 mg 166.7 mL/hr over 90 Minutes Intravenous  Once 11/07/19 1858 11/07/19 2154   11/07/19 1900  ceFEPIme (MAXIPIME) 2 g in sodium chloride 0.9 % 100 mL IVPB        2 g 200 mL/hr over 30 Minutes Intravenous  Once 11/07/19 1854 11/07/19 2009   11/07/19 1900  metroNIDAZOLE (FLAGYL) IVPB 500 mg        500 mg 100 mL/hr over 60 Minutes Intravenous  Once 11/07/19 1854 11/07/19 2325   11/07/19 1900  vancomycin (VANCOCIN)  IVPB 1000 mg/200 mL premix  Status:  Discontinued        1,000 mg 200 mL/hr over 60 Minutes Intravenous  Once 11/07/19 1854 11/07/19 1858       Objective: Vitals: Today's Vitals   11/17/19 0506 11/17/19 0514 11/17/19 0613 11/17/19 0827  BP: 115/73     Pulse: 62     Resp: 18     Temp: (!) 97.5 F (36.4 C)     TempSrc: Oral     SpO2: 100%     Weight:      Height:      PainSc:  9  Asleep 9     Intake/Output Summary (Last 24 hours) at 11/17/2019 1407 Last data filed at 11/17/2019 1300 Gross per 24 hour  Intake 1602 ml  Output 2440 ml  Net -838 ml   Filed Weights   11/15/19 0555  Weight: 54.8 kg   Weight change:    Intake/Output from previous day: 08/17 0701 - 08/18 0700 In: 472 [P.O.:472] Out: 3215 [Urine:3205; Drains:10] Intake/Output this shift: Total I/O In: 1130 [P.O.:480; I.V.:450; IV Piggyback:200] Out: 300 [Urine:300]  Examination: General exam: AAOx3,NAD,Weak appearing. HEENT:Oral mucosa moist, Ear/Nose WNL grossly, dentition normal. Respiratory system: bilaterally clear,no wheezing or crackles,no use of accessory muscle Cardiovascular system: S1 & S2 +, No JVD,. Gastrointestinal system: Abdomen soft, NT,ND, BS+ Nervous System:Alert, awake, moving extremities Rt side well, weak on left. Extremities: No edema, distal peripheral pulses palpable.  Skin: No rashes,no icterus. MSK: Normal muscle bulk,tone, power Rt nephrostomy drain + Pelvic drain + pus but slowing output foley + pinkish output  Data Reviewed: I have personally reviewed following labs and imaging studies CBC: Recent Labs  Lab 11/13/19 0322 11/14/19 0333 11/15/19 0341 11/16/19 0337 11/17/19 1109  WBC 6.4 9.4 8.1 7.9 7.8  HGB 7.6* 7.5* 7.3* 7.7* 7.3*  HCT 24.7* 24.6* 23.9* 25.5* 24.1*  MCV 96.1 97.2 97.2 97.0 98.8  PLT 96* 116* 126* 139* 166   Basic Metabolic Panel: Recent Labs  Lab 11/11/19 0821 11/11/19 0821 11/12/19 0358 11/13/19 0322 11/14/19 0333 11/15/19 0341  11/17/19 1109  NA 139   < > 143 135 143 135 142  K 3.6   < >  3.9 3.4* 4.4 4.7 4.8  CL 109   < > 113* 109 114* 108 115*  CO2 21*   < > 18* 18* 22 20* 20*  GLUCOSE 110*   < > 79 122* 90 89 93  BUN 62*   < > 54* 50* 46* 43* 43*  CREATININE 3.02*   < > 2.82* 2.57* 2.52* 2.53* 2.66*  CALCIUM 7.4*   < > 7.0* 6.8* 7.4* 7.3* 7.5*  MG 2.4  --  2.2 2.1 2.0 1.9  --    < > = values in this interval not displayed.   GFR: Estimated Creatinine Clearance: 24.3 mL/min (A) (by C-G formula based on SCr of 2.66 mg/dL (H)). Liver Function Tests: No results for input(s): AST, ALT, ALKPHOS, BILITOT, PROT, ALBUMIN in the last 168 hours. No results for input(s): LIPASE, AMYLASE in the last 168 hours. No results for input(s): AMMONIA in the last 168 hours. Coagulation Profile: No results for input(s): INR, PROTIME in the last 168 hours. Cardiac Enzymes: No results for input(s): CKTOTAL, CKMB, CKMBINDEX, TROPONINI in the last 168 hours. BNP (last 3 results) No results for input(s): PROBNP in the last 8760 hours. HbA1C: No results for input(s): HGBA1C in the last 72 hours. CBG: Recent Labs  Lab 11/15/19 1127 11/15/19 1845 11/15/19 2152 11/16/19 0758 11/16/19 1146  GLUCAP 77 80 158* 76 73   Lipid Profile: No results for input(s): CHOL, HDL, LDLCALC, TRIG, CHOLHDL, LDLDIRECT in the last 72 hours. Thyroid Function Tests: No results for input(s): TSH, T4TOTAL, FREET4, T3FREE, THYROIDAB in the last 72 hours. Anemia Panel: Recent Labs    11/15/19 0341  VITAMINB12 298  FOLATE 3.2*  FERRITIN 103  TIBC 191*  IRON 36*  RETICCTPCT 2.3   Sepsis Labs: No results for input(s): PROCALCITON, LATICACIDVEN in the last 168 hours.  Recent Results (from the past 240 hour(s))  Culture, blood (Routine x 2)     Status: Abnormal   Collection Time: 11/07/19  6:36 PM   Specimen: BLOOD  Result Value Ref Range Status   Specimen Description   Final    BLOOD RIGHT ANTECUBITAL Performed at [facility] [address] [place], [place] [address]    Special Requests   Final    BOTTLES DRAWN AEROBIC AND ANAEROBIC Blood Culture adequate volume Performed at [facility] [address], [place], [place] [address]    Culture  Setup Time   Final    GRAM NEGATIVE RODS IN BOTH AEROBIC AND ANAEROBIC BOTTLES CRITICAL RESULT CALLED TO, READ BACK BY AND VERIFIED WITH: Guadlupe Spanish PharmD 10:00 11/08/19 ([email or web address]) Performed at [facility], [address] [place], [place] [address]    Culture ESCHERICHIA COLI PROTEUS MIRABILIS  (A)  Final   Report Status 11/10/2019 FINAL  Final   Organism ID, Bacteria ESCHERICHIA COLI  Final   Organism ID, Bacteria PROTEUS MIRABILIS  Final      Susceptibility   Escherichia coli - MIC*    AMPICILLIN <=2 SENSITIVE Sensitive     CEFAZOLIN <=4 SENSITIVE Sensitive     CEFEPIME <=0.12 SENSITIVE Sensitive     CEFTAZIDIME <=1 SENSITIVE Sensitive     CEFTRIAXONE <=0.25 SENSITIVE Sensitive     CIPROFLOXACIN <=0.25 SENSITIVE Sensitive     GENTAMICIN <=1 SENSITIVE Sensitive     IMIPENEM <=0.25 SENSITIVE Sensitive     TRIMETH/SULFA <=20 SENSITIVE Sensitive     AMPICILLIN/SULBACTAM <=2 SENSITIVE Sensitive     PIP/TAZO <=4 SENSITIVE Sensitive     *  ESCHERICHIA COLI   Proteus mirabilis - MIC*    AMPICILLIN <=2 SENSITIVE Sensitive     CEFAZOLIN <=4 SENSITIVE Sensitive     CEFEPIME <=0.12 SENSITIVE Sensitive     CEFTAZIDIME <=1 SENSITIVE Sensitive     CEFTRIAXONE <=0.25 SENSITIVE Sensitive     CIPROFLOXACIN <=0.25 SENSITIVE Sensitive     GENTAMICIN <=1 SENSITIVE Sensitive     IMIPENEM 2 SENSITIVE Sensitive     TRIMETH/SULFA <=20 SENSITIVE Sensitive     AMPICILLIN/SULBACTAM <=2 SENSITIVE Sensitive     PIP/TAZO <=4 SENSITIVE Sensitive     * PROTEUS MIRABILIS  Blood Culture ID Panel (Reflexed)     Status: Abnormal   Collection Time: 11/07/19  6:36 PM  Result Value Ref Range Status   Enterococcus faecalis NOT DETECTED NOT DETECTED Final    Enterococcus Faecium NOT DETECTED NOT DETECTED Final   Listeria monocytogenes NOT DETECTED NOT DETECTED Final   Staphylococcus species NOT DETECTED NOT DETECTED Final   Staphylococcus aureus (BCID) NOT DETECTED NOT DETECTED Final   Staphylococcus epidermidis NOT DETECTED NOT DETECTED Final   Staphylococcus lugdunensis NOT DETECTED NOT DETECTED Final   Streptococcus species NOT DETECTED NOT DETECTED Final   Streptococcus agalactiae NOT DETECTED NOT DETECTED Final   Streptococcus pneumoniae NOT DETECTED NOT DETECTED Final   Streptococcus pyogenes NOT DETECTED NOT DETECTED Final   A.calcoaceticus-baumannii NOT DETECTED NOT DETECTED Final   Bacteroides fragilis NOT DETECTED NOT DETECTED Final   Enterobacterales DETECTED (A) NOT DETECTED Final    Comment: CRITICAL RESULT CALLED TO, READ BACK BY AND VERIFIED WITH: Guadlupe Spanish PharmD 10:00 11/08/19 ([email or web address])    Enterobacter cloacae complex NOT DETECTED NOT DETECTED Final   Escherichia coli DETECTED (A) NOT DETECTED Final    Comment: CRITICAL RESULT CALLED TO, READ BACK BY AND VERIFIED WITH: Guadlupe Spanish PharmD 10:00 11/08/19 ([email or web address])    Klebsiella aerogenes NOT DETECTED NOT DETECTED Final   Klebsiella oxytoca NOT DETECTED NOT DETECTED Final   Klebsiella pneumoniae NOT DETECTED NOT DETECTED Final   Proteus species DETECTED (A) NOT DETECTED Final    Comment: CRITICAL RESULT CALLED TO, READ BACK BY AND VERIFIED WITH: Guadlupe Spanish PharmD 10:00 11/08/19 ([email or web address])    Salmonella species NOT DETECTED NOT DETECTED Final   Serratia marcescens NOT DETECTED NOT DETECTED Final   Haemophilus influenzae NOT DETECTED NOT DETECTED Final   Neisseria meningitidis NOT DETECTED NOT DETECTED Final   Pseudomonas aeruginosa NOT DETECTED NOT DETECTED Final   Stenotrophomonas maltophilia NOT DETECTED NOT DETECTED Final   Candida albicans NOT DETECTED NOT DETECTED Final   Candida auris NOT DETECTED NOT DETECTED Final   Candida glabrata NOT DETECTED NOT DETECTED Final    Candida krusei NOT DETECTED NOT DETECTED Final   Candida parapsilosis NOT DETECTED NOT DETECTED Final   Candida tropicalis NOT DETECTED NOT DETECTED Final   Cryptococcus neoformans/gattii NOT DETECTED NOT DETECTED Final   CTX-M ESBL NOT DETECTED NOT DETECTED Final   Carbapenem resistance IMP NOT DETECTED NOT DETECTED Final   Carbapenem resistance KPC NOT DETECTED NOT DETECTED Final   Carbapenem resistance NDM NOT DETECTED NOT DETECTED Final   Carbapenem resist OXA 48 LIKE NOT DETECTED NOT DETECTED Final   Carbapenem resistance VIM NOT DETECTED NOT DETECTED Final    Comment: Performed at [facility], [address] [place], [place] [address]  Culture, blood (Routine x 2)     Status: Abnormal   Collection Time: 11/07/19  6:41 PM   Specimen: BLOOD  Result Value Ref Range Status  Specimen Description   Final    BLOOD LEFT ANTECUBITAL Performed at [facility] [address] [place], [place] [address]    Special Requests   Final    BOTTLES DRAWN AEROBIC AND ANAEROBIC Blood Culture adequate volume Performed at [facility] [address], [place], [place] [address]    Culture  Setup Time   Final    GRAM NEGATIVE RODS IN BOTH AEROBIC AND ANAEROBIC BOTTLES CRITICAL VALUE NOTED.  VALUE IS CONSISTENT WITH PREVIOUSLY REPORTED AND CALLED VALUE.    Culture (A)  Final    ESCHERICHIA COLI SUSCEPTIBILITIES PERFORMED ON PREVIOUS CULTURE WITHIN THE LAST 5 DAYS. Performed at [facility], [place] [address], [place], [place] [address]    Report Status 11/10/2019 FINAL  Final  Urine culture     Status: Abnormal   Collection Time: 11/07/19  6:45 PM   Specimen: Urine, Random  Result Value Ref Range Status   Specimen Description   Final    URINE, RANDOM Performed at [facility] [address], [place], [place] [address]    Special Requests   Final    NONE Performed at [facility], [place] [address] [place], [place] [address]    Culture (A)  Final    70,000 COLONIES/mL ESCHERICHIA COLI >=100,000 COLONIES/mL PSEUDOMONAS AERUGINOSA    Report Status 11/10/2019 FINAL  Final   Organism ID, Bacteria ESCHERICHIA COLI (A)  Final   Organism ID, Bacteria PSEUDOMONAS AERUGINOSA (A)  Final      Susceptibility   Escherichia coli - MIC*    AMPICILLIN <=2 SENSITIVE Sensitive     CEFAZOLIN <=4 SENSITIVE Sensitive     CEFTRIAXONE <=0.25 SENSITIVE Sensitive     CIPROFLOXACIN <=0.25 SENSITIVE Sensitive     GENTAMICIN <=1 SENSITIVE Sensitive     IMIPENEM <=0.25 SENSITIVE Sensitive     NITROFURANTOIN <=16 SENSITIVE Sensitive     TRIMETH/SULFA <=20 SENSITIVE Sensitive     AMPICILLIN/SULBACTAM <=2 SENSITIVE Sensitive     PIP/TAZO <=4 SENSITIVE Sensitive     * 70,000 COLONIES/mL ESCHERICHIA COLI   Pseudomonas aeruginosa - MIC*    CEFTAZIDIME 4 SENSITIVE Sensitive     CIPROFLOXACIN 0.5 SENSITIVE Sensitive     GENTAMICIN <=1 SENSITIVE Sensitive     IMIPENEM 1 SENSITIVE Sensitive     PIP/TAZO 8 SENSITIVE Sensitive     CEFEPIME 2 SENSITIVE Sensitive     * >=100,000 COLONIES/mL PSEUDOMONAS AERUGINOSA  SARS Coronavirus 2 by RT PCR (hospital order, performed in [facility] hospital lab) Nasopharyngeal Nasopharyngeal Swab     Status: None   Collection Time: 11/07/19  7:08 PM   Specimen: Nasopharyngeal Swab  Result Value Ref Range Status   SARS Coronavirus 2 NEGATIVE NEGATIVE Final    Comment: (NOTE) SARS-CoV-2 target nucleic acids are NOT DETECTED.  The SARS-CoV-2 RNA is generally detectable in upper and lower respiratory specimens during the acute phase of infection. The lowest concentration of SARS-CoV-2 viral copies this assay can detect is 250 copies / mL. A negative result does not preclude SARS-CoV-2 infection and should not be used as the sole basis for treatment or other patient management decisions.  A negative result may occur with improper specimen collection / handling, submission of  specimen other than nasopharyngeal swab, presence of viral mutation(s) within the areas targeted by this assay, and inadequate number of viral copies (<250 copies / mL). A negative result must be combined with clinical observations, patient history, and epidemiological  information.  Fact Sheet for Patients:   StrictlyIdeas.no  Fact Sheet for Healthcare Providers: BankingDealers.co.za  This test is not yet approved or  cleared by the [place] FDA and has been authorized for detection and/or diagnosis of SARS-CoV-2 by FDA under an Emergency Use Authorization (EUA).  This EUA will remain in effect (meaning this test can be used) for the duration of the COVID-19 declaration under Section 564(b)(1) of the Act, 21 U.S.C. section 360bbb-3(b)(1), unless the authorization is terminated or revoked sooner.  Performed at [facility], [place] [address] [place], [place] [address]   MRSA PCR Screening     Status: None   Collection Time: 11/08/19  2:35 AM   Specimen: Nasal Mucosa; Nasopharyngeal  Result Value Ref Range Status   MRSA by PCR NEGATIVE NEGATIVE Final    Comment:        The GeneXpert MRSA Assay (FDA approved for NASAL specimens only), is one component of a comprehensive MRSA colonization surveillance program. It is not intended to diagnose MRSA infection nor to guide or monitor treatment for MRSA infections. Performed at [facility], [place] [address] [place], [place] [address]   Aerobic/Anaerobic Culture (surgical/deep wound)     Status: None (Preliminary result)   Collection Time: 11/15/19  6:09 PM   Specimen: Abdomen; Abscess  Result Value Ref Range Status   Specimen Description   Final    ABDOMEN ABSCESS Performed at [facility] [address], [place], [place] [address]    Special Requests   Final    NONE Performed at [facility], [place]  [address] [place], [place] [address]    Gram Stain   Final    ABUNDANT WBC PRESENT, PREDOMINANTLY PMN FEW GRAM NEGATIVE RODS RARE GRAM POSITIVE COCCI IN PAIRS    Culture   Final    MODERATE GRAM NEGATIVE RODS MODERATE PSEUDOMONAS AERUGINOSA IDENTIFICATION AND SUSCEPTIBILITIES TO FOLLOW Performed at [facility], [place] [address] [place] [address] [place], [place] [address]    Report Status PENDING  Incomplete      Radiology Studies: IR NEPHROSTOMY PLACEMENT RIGHT  Result Date: 11/16/2019 INDICATION: 54 year old male with a history of Hodgkin's lymphoma and benign prostatic hyperplasia. Reportedly had a cystoscopy at an outside institution which revealed a bladder mass. He then presented with bilateral hydronephrosis, urosepsis and hematuria. 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 A left double-J ureteral stent was successfully placed, however the right ureter was too tortuous for placement of a retrograde stent. Therefore, patient presents for placement of a right-sided percutaneous nephrostomy tube. EXAM: IR NEPHROSTOMY PLACEMENT RIGHT COMPARISON:  None. MEDICATIONS: 2 g Ancef; The antibiotic was administered in an appropriate time frame prior to skin puncture. ANESTHESIA/SEDATION: Fentanyl 2 mcg IV; Versed 100 mg IV Moderate Sedation Time:  12 minutes The patient was continuously monitored during the procedure by the interventional radiology nurse under my direct supervision. CONTRAST:  45mL OMNIPAQUE IOHEXOL 300 MG/ML SOLN - administered into the collecting system(s) FLUOROSCOPY TIME:  Fluoroscopy Time:  minutes  seconds ( mGy). COMPLICATIONS: None immediate. TECHNIQUE: The procedure, risks, benefits, and alternatives were explained to the patient. Questions regarding the procedure were encouraged and answered. The patient understands and consents to the procedure. The right flank was prepped with chlorhexidine in a  sterile fashion, and a sterile drape was applied covering the operative field. A sterile gown and sterile gloves were used for the procedure. Local anesthesia was  provided with 1% Lidocaine. The right flank was interrogated with ultrasound and the left kidney identified. The kidney is hydronephrotic. A suitable access site on the skin overlying the lower pole, posterior calix was identified. After local mg anesthesia was achieved, a small skin nick was made with an 11 blade scalpel. A 21 gauge Accustick needle was then advanced under direct sonographic guidance into the lower pole of the right kidney. A 0.018 inch wire was advanced under fluoroscopic guidance into the left renal collecting system. The Accustick sheath was then advanced over the wire and a 0.018 system exchanged for a 0.035 system. Gentle hand injection of contrast material confirms placement of the sheath within the renal collecting system. There is moderate hydronephrosis. The tract from the scan into the renal collecting system was then dilated serially to 10-French. A 10-French Cook all-purpose drain was then placed and positioned under fluoroscopic guidance. The locking loop is well formed within the left renal pelvis. The catheter was secured to the skin with 2-0 Prolene and a sterile bandage was placed. Catheter was left to gravity bag drainage. IMPRESSION: Successful placement of a right 10 French percutaneous nephrostomy tube. Electronically Signed   By: Jacqulynn Cadet M.D.   On: 11/16/2019 14:08   CT IMAGE GUIDED DRAINAGE BY PERCUTANEOUS CATHETER  Result Date: 11/16/2019 INDICATION: 54 year old male referred for pelvic abscess drainage, with evidence of colovesical fistula EXAM: CT GUIDED DRAINAGE OF  ABSCESS MEDICATIONS: The patient is currently admitted to the hospital and receiving intravenous antibiotics. The antibiotics were administered within an appropriate time frame prior to the initiation of the procedure.  ANESTHESIA/SEDATION: 2.0 mg IV Versed 100 mcg IV Fentanyl Moderate Sedation Time:  12 minutes The patient was continuously monitored during the procedure by the interventional radiology nurse under my direct supervision. COMPLICATIONS: None TECHNIQUE: Informed written consent was obtained from the patient after a thorough discussion of the procedural risks, benefits and alternatives. All questions were addressed. Maximal Sterile Barrier Technique was utilized including caps, mask, sterile gowns, sterile gloves, sterile drape, hand hygiene and skin antiseptic. A timeout was performed prior to the initiation of the procedure. PROCEDURE: The operative field was prepped with Chlorhexidine in a sterile fashion, and a sterile drape was applied covering the operative field. A sterile gown and sterile gloves were used for the procedure. Local anesthesia was provided with 1% Lidocaine. Scout images were acquired in supine position. The patient is prepped and draped in the usual sterile fashion. 1% lidocaine was used for local anesthesia. Using modified Seldinger technique, a 12 French drain was placed into the pelvic abscess just above the bladder. Approximately 300 cc of purulent fluid aspirated. Culture was sent. Catheter was attached to gravity drainage. Patient tolerated the procedure well and remained hemodynamically stable throughout. No complications were encountered and no significant blood loss. FINDINGS: Initial CT demonstrates balloon retention urinary catheter. A left ureteral stent in position. Abscess within the pelvis just superior to the urinary bladder with enteric contrast in the dependent aspect. Air in the anti dependent aspect. Very small window for placement of the drain at the most cephalad aspect of the abscess. Twelve French drain was placed with the abscess decompressed. IMPRESSION: Status post CT-guided drainage of pelvic abscess. Signed, Dulcy Fanny. Dellia Nims, RPVI [facility] Radiology Electronically Signed   By: Corrie Mckusick D.O.   On: 11/16/2019 07:35     LOS: 9 days   Antonieta Pert, MD [facility]  11/17/2019, 2:07 PM

## 2019-11-17 NOTE — Discharge Instructions (Signed)
Tips For Adding Protein Nutrition Therapy  Patients may be advised to increase the protein in their diet but not necessarily the calories as well. However, note that when adding protein to your diet, you will also be adding extra calories. The following suggestions may help add the extra protein while keeping the calories as low as possible.  Tips . Add extra egg to one or more meals  . Increase the portion of milk to drink and change to skim milk if able  . Include [place] yogurt or cottage cheese for snack or part of a meal  . Increase portion size of protein entre and decrease portion of starch/bread  . Mix protein powder, nut butter, almond/nut milk, non-fat dry milk, or Greek yogurt to shakes and smoothies  . Use these ingredients also in baked goods or other recipes . Use double the amount of sandwich filling  . Add protein foods to all snacks including cheese, nut butters, milk and yogurt Food Tips for Including Protein  Beans . Cook and use dried peas, beans, and tofu in soups or add to casseroles, pastas, and grain dishes that also contain cheese or meat  . Mash with cheese and milk  . Use tofu to make smoothies  Commercial Protein Supplements . Use nutritional supplements or protein powder sold at pharmacies and grocery stores  . Use protein powder in milk drinks and desserts, such as pudding  . Mix with ice cream, milk, and fruit or other flavorings for a high-protein milkshake  Cottage Cheese or [employer] . Mix with or use to stuff fruits and vegetables  . Add to casseroles, spaghetti, noodles, or egg dishes such as omelets, scrambled eggs, and souffls  . Use gelatin, pudding-type desserts, cheesecake, and pancake or waffle batter  . Use to stuff crepes, pasta shells, or manicotti  . Puree and use as a substitute for sour cream  Eggs, Egg whites, and Egg Yolks . Add chopped, hard-cooked eggs to salads and dressings, vegetables, casseroles, and creamed meats  . Beat eggs into  mashed potatoes, vegetable purees, and sauces  . Add extra egg whites to quiches, scrambled eggs, custards, puddings, pancake batter, or [place]  . Make a rich custard with egg yolks, double strength milk, and sugar  . Add extra hard-cooked yolks to deviled egg filling and sandwich spreads  Hard or Semi-Soft Cheese (Cheddar, Sherley Bounds) . Melt on sandwiches, bread, muffins, tortillas, hamburgers, hot dogs, other meats or fish, vegetables, eggs, or desserts such as stewed figs or pies  . Grate and add to soups, sauces, casseroles, vegetable dishes, potatoes, rice noodles, or meatloaf  . Serve as a snack with crackers or bagels  Ice cream, Yogurt, and Frozen Yogurt . Add to milk drinks such as milkshakes  . Add to cereals, fruits, gelatin desserts, and pies  . Blend or whip with soft or cooked fruits  . Sandwich ice cream or frozen yogurt between enriched cake slices, cookies, or graham crackers  . Use seasoned yogurt as a dip for fruits, vegetables, or chips  . Use yogurt in place of sour cream in casseroles  Meat and Fish . Add chopped, cooked meat or fish to vegetables, salads, casseroles, soups, sauces, and biscuit dough  . Use in omelets, souffls, quiches, and sandwich fillings  . Add chicken and [place] to stuffing  . Wrap in pie crust or biscuit dough as turnovers  . Add to stuffed baked potatoes  . Add pureed meat to soups  Milk . Use in beverages and in cooking  . Use in preparing foods, such as hot cereal, soups, cocoa, or pudding  . Add cream sauces to vegetable and other dishes  . Use evaporated milk, evaporated skim milk, or sweetened condensed milk instead of milk or water in recipes.  Nonfat Dry Milk . Add 1/3 cup of nonfat dry milk powdered milk to each cup of regular milk for "double strength" milk  . Add to yogurt and milk drinks, such as pasteurized eggnog and milkshakes  . Add to scrambled eggs and mashed potatoes  . Use in casseroles, meatloaf, hot  cereal, breads, muffins, sauces, cream soups, puddings and custards, and other milk-based desserts  Nuts, Seeds, and Wheat Germ . Add to casseroles, breads, muffins, pancakes, cookies, and waffles  . Sprinkle on fruit, cereal, ice cream, yogurt, vegetables, salads, and toast as a crunchy topping  . Use in place of breadcrumbs  . Blend with parsley or spinach, herbs, and cream for a noodle, pasta, or vegetable sauce.  . Roll banana in chopped nuts  Peanut Butter . Spread on sandwiches, toast, muffins, crackers, waffles, pancakes, and fruit slices  . Use as a dip for raw vegetables, such as carrots, cauliflower, and celery  . Blend with milk drinks, smoothies, and other beverages  . Swirl through soft ice cream or yogurt  . Spread on a banana then roll in crushed, dry cereal or chopped nuts   Tips to Eat More Calories and Protein  Aim for at Least 6 Meals and Snacks Each Day  Extra meals and snacks can help you get enough calories and protein.  You may want to try high-calorie supplement drinks (made at home or bought at a store) periodically between meals to get more calories each day.  If you buy the drink at the store, read the label to look for products with 200-400 calories per serving.  If you make the drink at home, you can increase calories by adding protein ingredients such as nonfat milk, low-fat yogurt, nonfat milk powder, or protein powder.  Enjoy snacks such as milkshakes, smoothies, pudding, ice cream, or custard.  Eat More Fat  Fat provides a lot of calories in just a few bites. A tablespoon of oil, butter, or margarine has about 100 calories.  Add butter, margarine, or oil to bread, potatoes, vegetables, and soups.  Use mayonnaise, salad dressing, and peanut butter freely.  Choose High-Protein Foods  Enjoy milk, eggs, cheese, meat, fish, poultry, and beans. Consider trying protein powders and meal replacement shakes and bars.  Choose higher-fat meats. They have  more calories than lean meats.  Examples include chicken thighs, marbled meats, bacon, sausage, poultry with skin  Choose whole milk instead of low-fat or skim milk.  Eat high-fat cheeses instead of low-fat or nonfat cheeses.  Shopping Tips  Avoid diet, low-calorie, or low-fat food items.  Look for dairy products (milk, cheese, yogurt, cottage cheese) that are labeled "whole fat" or have at least 4% fat.  Purchase nonfat dry milk powder or protein powder to use to make shakes or other blended recipes.  Cooking Tips  Make a high-protein milk recipe like the one below. The recipe can be prepared in advance and stored in the refrigerator until you are ready to drink it. Use this high-protein milk in recipes that call for milk or drink it as a beverage.  1 cup whole milk   cup nonfat dry milk powder  Add cheese sauce, butter, and sour  cream to vegetable and potato dishes.  Get extra calories by adding condensed milk, cream, butter, nut butters, and sweetener to hot cereals, mashed potato, pudding, and soups. Examples:  Prepare oatmeal with condensed milk, butter/nut butter, and brown sugar  Prepare mashed potatoes with cream, butter, and cheese  Prepare soup with cream and extra butter, or puree the soup with cream to make a bisque  Add cream to pudding mix or use pudding dry mix in cakes/baked goods  Serve items with extra sauces. These contain additional calories:  Gravy on meats and potatoes  Extra mayonnaise, BBQ sauce or ketchup  Dipping sauces, hummus, and regular (not low-fat/low-calorie) salad dressing  Copyright 2020  Academy of Nutrition and Dietetics. All rights reserved

## 2019-11-17 NOTE — Progress Notes (Signed)
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  That bothers him the more than anything else.  He has new bloody drainage from new right nephrostomy drain placed yesterday by IR.  The pelvic drain is still purulent colored fluid, but much less now.  These sites are sore, but otherwise no significant issues.    Objective: Vital signs in last 24 hours: Temp:  [97.5 F (36.4 C)-98.5 F (36.9 C)] 97.5 F (36.4 C) (08/18 0506) Pulse Rate:  [52-84] 62 (08/18 0506) Resp:  [7-18] 18 (08/18 0506) BP: (107-130)/(62-86) 115/73 (08/18 0506) SpO2:  [96 %-100 %] 100 % (08/18 0506) Last BM Date: 11/13/19 472 p.o. 3205 urine Drain 10 BM x2 Afebrile, vital signs are stable No labs today Intake/Output from previous day: 08/17 0701 - 08/18 0700 In: 472 [P.O.:472] Out: 3215 [Urine:3205; Drains:10] Intake/Output this shift: Total I/O In: -  Out: 300 [Urine:300]  General appearance: alert, cooperative, no distress and Pale/chronically ill GI: Soft, sore, right IR nephrostomy drain with clear bloody fluid.  The midline IR drain pelvic abscess with decreased drainage, still brown purulent appearing.  Lab Results:  Recent Labs    11/15/19 0341 11/16/19 0337  WBC 8.1 7.9  HGB 7.3* 7.7*  HCT 23.9* 25.5*  PLT 126* 139*    BMET Recent Labs    11/15/19 0341  NA 135  K 4.7  CL 108  CO2 20*  GLUCOSE 89  BUN 43*  CREATININE 2.53*  CALCIUM 7.3*   PT/INR No results for input(s): LABPROT, INR in the last 72 hours.  Recent Labs  Lab 11/10/19 0932  AST 17  ALT 41  ALKPHOS 111  BILITOT 0.4  PROT 5.6*  ALBUMIN 1.8*     Lipase  No results found for: LIPASE   Medications: . Chlorhexidine Gluconate Cloth  6 each Topical Daily  . DULoxetine  60 mg Oral Daily  . feeding supplement (KATE FARMS STANDARD 1.4)  325 mL Oral BID BM  . ferrous sulfate  325 mg Oral Q breakfast  . gabapentin  300 mg Oral BID   . midodrine  5 mg Oral TID AC  . multivitamin with minerals  1 tablet Oral Daily  . pantoprazole  40 mg Oral BID  . tamsulosin  0.4 mg Oral Daily   . ceFEPime (MAXIPIME) IV 2 g (11/17/19 0827)  . lactated ringers 75 mL/hr at 11/17/19 0359    Assessment/Plan Sepsis Chronic indwelling catheter CKD -Creatinine 2.57>>2.52>>2.53 Hx CVA7/18/21 Chronic pain Hx GI bleed Hx PTSD Hx Hodgkin's lymphoma 2010 Anemia -H/H 7.3/23.9 Thrombocytopenia -Platelets 96K >>116 K>>126K COVID-19 illness 12/2018 - Outpatient management - CovidNegative8/10/2019 Mild malnutrition-prealbumin 18 Deconditioning  Colovesicular fistula-new finding Hematuria, bilateral hydronephrosis, probable colovesicular fistula Cystoscopy, cystogram, bilateral retrograde pyelogram, left ureteral stent, fulguration of the bladder, 11/14/2019 Dr. Raynelle Bring, POD #2 IR drain placement pelvic abscess 11/15/19 Urinary retention-Foley in place Bilateral ureteral obstruction/left renal stent plan IR nephrostomy rescheduled for today   FEN: IV fluids/regular diet ID: Rocephin 8/9/-8/10; Maxipime 8/8 x 1,8/11>>day 5 DVT: SCDs/off heparin for hematuria Follow-up: IR drain clinic, GI with colonoscopy in about 6 weeks.  Subsequent follow-up with [name] for probable robotic colectomy/repair of colovesicular fistula  Plan: As noted above he needs to get through this acute phase, follow-up with the IR drain clinic.  He will need a colonoscopy and then follow-up with [name] for colorectal repair. [name] recommends a minimum of 5  days of IV antibiotics.      LOS: 9 days    Chase [name] 11/17/2019 Please see Amion

## 2019-11-17 NOTE — Progress Notes (Signed)
PT Cancellation Note  Patient Details Name: Chase [name] MRN: [id number] DOB: 02/06/1966   Cancelled Treatment:    Reason Eval/Treat Not Completed: Fatigue/lethargy limiting ability to participate;Patient declined, I am on morphine. I will be ready tomorrow.  [name] 11/17/2019, 5:08 PM  [place] Pager [phone] Office [phone]

## 2019-11-17 NOTE — Progress Notes (Signed)
OT Cancellation Note  Patient Details Name: Chase [name] MRN: [id number] DOB: [birth year]   Cancelled Treatment:    Reason Eval/Treat Not Completed: Patient declined, no reason specified. Spoke with PT who states patient politely declined today "definintely tomorrow!" Will re-attempt 8/19 as schedule permits.  Delbert Phenix OT OT pager: [phone] 11/17/2019, 1:57 PM

## 2019-11-17 NOTE — Progress Notes (Signed)
Nutrition Follow-up  DOCUMENTATION CODES:   Severe malnutrition in context of chronic illness, Underweight  INTERVENTION:   -D/c [employer] -Boost Breeze po QID, each supplement provides 250 kcal and 9 grams of protein -Double protein portions with meals -"High Calorie, High Protein" handout from Academy of Nutrition and Dietetics.   NUTRITION DIAGNOSIS:   Severe Malnutrition related to chronic illness as evidenced by severe fat depletion, severe muscle depletion.  Ongoing.  GOAL:   Patient will meet greater than or equal to 90% of their needs  Progressing.  MONITOR:   PO intake, Supplement acceptance, Labs, Weight trends  REASON FOR ASSESSMENT:   Consult Assessment of nutrition requirement/status  ASSESSMENT:   54 year-old male with medical history of BPH with chronic indwelling foley catheter, recent CVA (admitted 7/18-7/27), CKD, chronic pain, and Hodgkin's lymphoma (treated with ABVD and XRT). He presented from rehab facility with low BP and fever.  8/15: s/p Cystoscopy, cystogram, bilateral retrograde pyelogram, left ureteral stent, fulguration of the bladder 8/16: s/p Image guided drain placement, pelvic abscess 8/17: s/p right nephrostomy placement  Patient with many complaints regarding the hospital menu and kitchen policies. Pt was made NPO 8/16 and was not able to eat again until late last night d/t procedures. Pt states he has talked about his complaints with administration and the head chef of the kitchen. Pt states the chef told him he could order lunch/dinner items for breakfast but the staff is still not sending it for him. RD provided active listening and pt expressed appreciation.   RD discussed protein supplement options. Pt does not want anything with a milky consistency so [employer] is not an option even though it is vegan. Pt likes clear liquid supplements, so will order Boost Breeze supplements. Will also order that pt receive double portions of his  protein options with each meal tray. Will also place "High Protein, High calorie" handouts in discharge instructions.   Pt appears very motivated to gain weight.  Admission weight: 121 lbs.  Last recorded weight 8/16: 120 lbs.  Medications: Multivitamin with minerals daily, Lactated ringers infusion Labs reviewed:  CBGs: 73-76  Diet Order:   Diet Order            Diet regular Room service appropriate? Yes; Fluid consistency: Thin  Diet effective now                 EDUCATION NEEDS:   No education needs have been identified at this time  Skin:  Skin Assessment: Skin Integrity Issues: Skin Integrity Issues:: Stage I Stage I: coccyx (per WOC note)  Last BM:  8/17  Height:   Ht Readings from Last 1 Encounters:  11/07/19 5\' 8"  (1.727 m)    Weight:   Wt Readings from Last 1 Encounters:  11/15/19 54.8 kg   BMI:  Body mass index is 18.37 kg/m.  Estimated Nutritional Needs:   Kcal:  2100-2300 kcal  Protein:  100-110 grams  Fluid:  >/= 2.2 L/day  Clayton Bibles, MS, RD, LDN Inpatient Clinical Dietitian Contact information available via Amion

## 2019-11-18 DIAGNOSIS — N321 Vesicointestinal fistula: Secondary | ICD-10-CM

## 2019-11-18 LAB — BASIC METABOLIC PANEL
Anion gap: 11 (ref 5–15)
BUN: 40 mg/dL — ABNORMAL HIGH (ref 6–20)
CO2: 19 mmol/L — ABNORMAL LOW (ref 22–32)
Calcium: 7.6 mg/dL — ABNORMAL LOW (ref 8.9–10.3)
Chloride: 110 mmol/L (ref 98–111)
Creatinine, Ser: 2.64 mg/dL — ABNORMAL HIGH (ref 0.61–1.24)
GFR calc Af Amer: 30 mL/min — ABNORMAL LOW (ref >60)
GFR calc non Af Amer: 26 mL/min — ABNORMAL LOW (ref >60)
Glucose, Bld: 84 mg/dL (ref 70–99)
Potassium: 4.8 mmol/L (ref 3.5–5.1)
Sodium: 140 mmol/L (ref 135–145)

## 2019-11-18 LAB — CBC
HCT: 23.2 % — ABNORMAL LOW (ref 39.0–52.0)
Hemoglobin: 6.9 g/dL — CL (ref 13.0–17.0)
MCH: 29.9 pg (ref 26.0–34.0)
MCHC: 29.7 g/dL — ABNORMAL LOW (ref 30.0–36.0)
MCV: 100.4 fL — ABNORMAL HIGH (ref 80.0–100.0)
Platelets: 177 10*3/uL (ref 150–400)
RBC: 2.31 MIL/uL — ABNORMAL LOW (ref 4.22–5.81)
RDW: 20 % — ABNORMAL HIGH (ref 11.5–15.5)
WBC: 7.7 10*3/uL (ref 4.0–10.5)
nRBC: 0 % (ref 0.0–0.2)

## 2019-11-18 LAB — PREPARE RBC (CROSSMATCH)

## 2019-11-18 MED ORDER — SODIUM CHLORIDE 0.9% FLUSH
5.0000 mL | Freq: Three times a day (TID) | INTRAVENOUS | Status: DC
  Administered 2019-11-18 – 2019-11-19 (×5): 5 mL

## 2019-11-18 NOTE — Progress Notes (Signed)
OT Cancellation Note  Patient Details Name: Chase [name] MRN: [id number] DOB: [birth year]   Cancelled Treatment:    Reason Eval/Treat Not Completed: Patient declined, no reason specified. Attempted after patient's blood transfusion, patient wanting to eat and asked to come back tomorrow. Will attempt 8/20 as schedule allows.  Delbert Phenix OT OT pager: [phone]   Rosemary Holms 11/18/2019, 2:23 PM

## 2019-11-18 NOTE — Consult Note (Signed)
[place] for Infectious Disease       Reason for Consult: abscess    Referring Physician: Dr. Lupita Leash  Principal Problem:   Pelvic abscess with colovescial fistula Active Problems:   CKD (chronic kidney disease), stage IV (HCC)   Chronic pain   Fall   Pseudomonas urinary tract infection   Cerebral embolism with cerebral infarction   Protein-calorie malnutrition, severe   Septic shock (HCC)   BPH (benign prostatic hyperplasia)   Colovesical fistula   Acute on chronic renal failure (HCC)   Cervical spondylosis without myelopathy   History of COVID-19   Hodgkin lymphoma, unspecified, unspecified site ([place])   E. coli UTI (urinary tract infection)   PTSD (post-traumatic stress disorder)   Chemical exposure   Cervical radiculopathy   . Chlorhexidine Gluconate Cloth  6 each Topical Daily  . DULoxetine  60 mg Oral Daily  . feeding supplement  1 Container Oral QID  . ferrous sulfate  325 mg Oral Q breakfast  . gabapentin  300 mg Oral BID  . midodrine  5 mg Oral TID AC  . multivitamin with minerals  1 tablet Oral Daily  . pantoprazole  40 mg Oral BID  . sodium chloride flush  5 mL Intracatheter Q8H  . tamsulosin  0.4 mg Oral Daily    Recommendations: Continue cefepime Will consider oral cipro at discharge if treatment continuation indicated at that time  Assessment: He has a colovesical fistula with pelvic abscess and drain placement.  Purulent drainage decreasing but persists.  Will continue with antibiotics as above.   Antibiotics: Cefepime day 9 Previous ceftriaxone  HPI: Chase [name] is a 54 y.o. male with recent CVA, history of Hodgkin's lymphoma, indwelling foley came in 8/8 with fever and chills, concern for sepsis.  On 8/15 a CT of the pelvis found a large air-fluid collection cw an abscess and a colovesical fistula.  Cultures with E coli, Pseudomonas.  Also with hydronephrosis and had a stent placed.  A drain was placed by IR and remains in now, less  drainage overall.  Plan is for eventual surgery with resection of the anastomosis vs colostomy.  He has been on above antibiotics and feels better overall.   Review of Systems:  Constitutional: negative for fevers and chills Integument/breast: negative for rash Musculoskeletal: negative for myalgias and arthralgias All other systems reviewed and are negative    Past Medical History:  Diagnosis Date  . Cervical radiculopathy    with left sided weakness  . Chemical exposure    service related injury  . Chronic pain   . GIB (gastrointestinal bleeding)   . PTSD (post-traumatic stress disorder)   . Renal disorder    CKD  . Urinary retention    due to bladder outlet obstruction    Social History   Tobacco Use  . Smoking status: Never Smoker  . Smokeless tobacco: Current User  Vaping Use  . Vaping Use: Never used  Substance Use Topics  . Alcohol use: Never  . Drug use: Never    Family History  Problem Relation Age of Onset  . Renal cancer Father   . Heart disease Paternal Grandfather     Allergies  Allergen Reactions  . Nsaids Other (See Comments)    Bleeding GI Ulceration    Physical Exam: Constitutional: in no apparent distress  Vitals:   11/18/19 1330 11/18/19 1407  BP: 108/80 110/66  Pulse: 75 66  Resp: 18 16  Temp: 98.4 F (  36.9 C)   SpO2: 98% 100%   EYES: anicteric Cardiovascular: Cor RRR Respiratory: clear; Musculoskeletal: no pedal edema noted Skin: negatives: no rash Neuro: non-focal  Lab Results  Component Value Date   WBC 7.7 11/18/2019   HGB 6.9 (LL) 11/18/2019   HCT 23.2 (L) 11/18/2019   MCV 100.4 (H) 11/18/2019   PLT 177 11/18/2019    Lab Results  Component Value Date   CREATININE 2.64 (H) 11/18/2019   BUN 40 (H) 11/18/2019   NA 140 11/18/2019   K 4.8 11/18/2019   CL 110 11/18/2019   CO2 19 (L) 11/18/2019    Lab Results  Component Value Date   ALT 41 11/10/2019   AST 17 11/10/2019   ALKPHOS 111 11/10/2019      Microbiology: Recent Results (from the past 240 hour(s))  Aerobic/Anaerobic Culture (surgical/deep wound)     Status: None (Preliminary result)   Collection Time: 11/15/19  6:09 PM   Specimen: Abdomen; Abscess  Result Value Ref Range Status   Specimen Description   Final    ABDOMEN ABSCESS Performed at [facility] [address], [place], [place] [address]    Special Requests   Final    NONE Performed at [facility], [place] [address], [place], [place] [address]    Gram Stain   Final    ABUNDANT WBC PRESENT, PREDOMINANTLY PMN FEW GRAM NEGATIVE RODS RARE GRAM POSITIVE COCCI IN PAIRS Performed at [facility], [place] [address] [place], [place] [address]    Culture   Final    MODERATE ESCHERICHIA COLI MODERATE PSEUDOMONAS AERUGINOSA NO ANAEROBES ISOLATED; CULTURE IN PROGRESS FOR 5 DAYS    Report Status PENDING  Incomplete   Organism ID, Bacteria ESCHERICHIA COLI  Final   Organism ID, Bacteria PSEUDOMONAS AERUGINOSA  Final      Susceptibility   Escherichia coli - MIC*    AMPICILLIN >=32 RESISTANT Resistant     CEFAZOLIN >=64 RESISTANT Resistant     CEFEPIME 0.5 SENSITIVE Sensitive     CEFTAZIDIME 16 INTERMEDIATE Intermediate     CEFTRIAXONE >=64 RESISTANT Resistant     CIPROFLOXACIN <=0.25 SENSITIVE Sensitive     GENTAMICIN <=1 SENSITIVE Sensitive     IMIPENEM <=0.25 SENSITIVE Sensitive     TRIMETH/SULFA <=20 SENSITIVE Sensitive     AMPICILLIN/SULBACTAM 16 INTERMEDIATE Intermediate     PIP/TAZO 8 SENSITIVE Sensitive     * MODERATE ESCHERICHIA COLI   Pseudomonas aeruginosa - MIC*    CEFTAZIDIME 4 SENSITIVE Sensitive     CIPROFLOXACIN <=0.25 SENSITIVE Sensitive     GENTAMICIN <=1 SENSITIVE Sensitive     IMIPENEM 1 SENSITIVE Sensitive     PIP/TAZO 8 SENSITIVE Sensitive     CEFEPIME 2 SENSITIVE Sensitive     * MODERATE PSEUDOMONAS AERUGINOSA    [facility] Headings, [place] for Infectious Disease [facility] [email or web address] 11/18/2019, 2:33 PM

## 2019-11-18 NOTE — Progress Notes (Signed)
Patient did not want dressing changed and would prefer to wait for the MD to check it today. Patient had 850 mL's of serosanguineous drainage. Dressing dry and intact, patient deny any new pain at this time. Will inform on-coming shift and will continue to monitor the patient.

## 2019-11-18 NOTE — Progress Notes (Signed)
PROGRESS NOTE    Chase [name]  RFF:[id number] DOB: 1966-02-18 DOA: 11/07/2019 PCP: [name]   Chief Complaint  Patient presents with  . 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  Initially found to be in septic shock from urosepsis started on pressors, Urine culture with E coli and pseudomonas and Blood culture with E coli and proteus, has been on antibiotics then transition to cefepime. He  Reports he had hematuria from traumatic pulling of foley when it got caught during bed transfer. But he reports he has had hematuria before. 8/13 seen by urology and renal ultrasound shows "Large avascular heterogeneous intraluminal mass within the urinary bladder measuring up to 9.4 cm. Findings are most compatible with a large intraluminal blood clot in the setting of hematuria. Follow-up to resolution is recommended to exclude the presence of an underlying malignancy." 8/15-underwent cystoscopy, bilateral retrograde pyelogram with left ureteral stent placement and fulguration of bladder, found to have bilateral hydronephrosis and probable colovesical fistula.  Subjective: Seen this morning.  He was alert awake oriented.   H&H dropped to less than 7 gm and 1 unit prbc ordered  pelvic drain output 15 mL.  Urine right nephrostomy output 840 mL and total in the catheter 2950 ml.  Assessment & Plan:  Septic shock 2/2 UTI, Urine culture with E coli and pseudomonas and Blood culture with E coli and proteus:Shock physiology resolved. On midodrine and we will wean off. Sepsis due to  colovesical fistula with large pelvis.  S/p pelvic drain placement by IR 8/16 and Rt nephrostomy tube placement  8/17 surgery advised minimum 5 days of IV  antibiotics.  Continue drain management as per IR preferably can remove with drain clinic output significantly improved otherwise may need to leave it in until the surgery per CCS. pelvic drain cultures shows moderate E. coli sensitive to cefepime, Cipro, Bactrim, moderate Pseudomonas aeruginosa-pansensitive. Will get ID opinion for Abx duration.  he will need drain clinic f/u, and will need colonoscopy and then follow-up with [name] for colorectal repair and has advised evaluation by surgery and cardiology per op.  Urology following awaiting decision on internalization of the right nephrostomy tube.  AKI on CKD stage IIIa with chronic urine retention in the setting of BPH. Baseline creatinine 2.3 peaked to 3.0.He does have bilateral hydronephrosis, left ureteric stent placed 8/16 and  rt right nephrostomy tubeby IR 8/17- foley +:Urine from both sites  draining well but appears blood tinged. Continue to monitor renal function closely.  Overall stable. Recent Labs  Lab 11/13/19 0322 11/14/19 0333 11/15/19 0341 11/17/19 1109 11/18/19 0314  BUN 50* 46* 43* 43* 40*  CREATININE 2.57* 2.52* 2.53* 2.66* 2.64*   Acute blood loss anemia due to hematuria in the setting of chronic anemia.  Hemoglobin again dropped to 6.9 g getting 1 unit PRBC today afternoon.  Repeat CBC in the morning   previously was at 6.2 needing 1 unit PRBC.  Recent Labs  Lab 11/14/19 0333 11/15/19 0341 11/16/19 0337 11/17/19 1109 11/18/19 0314  HGB 7.5* 7.3* 7.7* 7.3* 6.9*   Recent CVA in July 2021 on aspirin: Unable to move his leg but only moving toes on the left, right side stable. Continue PT OT as tolerated  Chronic pain: cont  on norco, Cymbalta and Neurontin. Also getting iv morphine.   Adult failure to thrive with history of gastric bypass, sacral stage II pressure ulcer no signs of infection.  Augment nutritional status.  PT OT evaluation   GERD continue PPI..  DVT prophylaxis: Place and maintain sequential  compression device Start: 11/10/19 1028 Code Status:   Code Status: Full Code  Family Communication: plan of care discussed with patient at bedside.  Status is: Inpatient  Remains inpatient appropriate because:IV treatments appropriate due to intensity of illness or inability to take PO and Inpatient level of care appropriate due to severity of illness   Dispo: The patient is from: SNF              Anticipated d/c is to: SNF              Anticipated d/c date is: 1 days once hemoglobin is stable and after ID input.  Urology and surgery okay for discharge, await  IR input regarding internalization of the right nephrostomy tube.               Patient currently is not medically stable to d/c.    Nutrition: Diet Order            Diet regular Room service appropriate? Yes; Fluid consistency: Thin  Diet effective now                 Nutrition Problem: Severe Malnutrition Etiology: chronic illness Signs/Symptoms: severe fat depletion, severe muscle depletion Interventions: Boost Breeze, Refer to RD note for recommendations Body mass index is 18.37 kg/m.  Consultants:see note  Procedures:see note Microbiology:see note Blood Culture    Component Value Date/Time   SDES  11/15/2019 1809    ABDOMEN ABSCESS Performed at [facility], [place] [address] [place], [place] [address]    SPECREQUEST  11/15/2019 1809    NONE Performed at [facility], [place] [address], [place], [place] [address]    CULT  11/15/2019 1809    MODERATE ESCHERICHIA COLI MODERATE PSEUDOMONAS AERUGINOSA NO ANAEROBES ISOLATED; CULTURE IN PROGRESS FOR 5 DAYS    REPTSTATUS PENDING 11/15/2019 1809    Other culture-see note  Medications: Scheduled Meds: . Chlorhexidine Gluconate Cloth  6 each Topical Daily  . DULoxetine  60 mg Oral Daily  . feeding supplement  1 Container Oral QID  . ferrous sulfate  325 mg Oral Q breakfast  . gabapentin  300 mg Oral BID  . midodrine  5 mg  Oral TID AC  . multivitamin with minerals  1 tablet Oral Daily  . pantoprazole  40 mg Oral BID  . sodium chloride flush  5 mL Intracatheter Q8H  . tamsulosin  0.4 mg Oral Daily   Continuous Infusions: . ceFEPime (MAXIPIME) IV 2 g (11/18/19 0826)  . lactated ringers 75 mL/hr at 11/18/19 0547    Antimicrobials: Anti-infectives (From admission, onward)   Start     Dose/Rate Route Frequency Ordered Stop   11/16/19 1315  ceFAZolin (ANCEF) IVPB 2g/100 mL premix        2 g 200 mL/hr over 30 Minutes Intravenous  Once 11/16/19 1302 11/16/19 1350   11/10/19 0830  ceFEPIme (MAXIPIME) 2 g in sodium chloride 0.9 % 100 mL IVPB        2 g 200 mL/hr over 30 Minutes Intravenous Every 24 hours 11/10/19 0815     11/08/19 2000  ceFEPIme (MAXIPIME) 2 g in sodium chloride 0.9 % 100 mL IVPB  Status:  Discontinued        2 g 200 mL/hr over 30 Minutes Intravenous Every 24 hours 11/08/19 0025 11/08/19 1037   11/08/19 2000  vancomycin (VANCOREADY) IVPB 500 mg/100 mL  Status:  Discontinued        500 mg 100 mL/hr over 60 Minutes Intravenous Every 24 hours 11/08/19 0027 11/08/19 1037   11/08/19 1800  cefTRIAXone (ROCEPHIN) 2 g in sodium chloride 0.9 % 100 mL IVPB  Status:  Discontinued        2 g 200 mL/hr over 30 Minutes Intravenous Every 24 hours 11/08/19 1037 11/10/19 0815   11/08/19 0300  ceFEPIme (MAXIPIME) 1 g in sodium chloride 0.9 % 100 mL IVPB  Status:  Discontinued        1 g 200 mL/hr over 30 Minutes Intravenous Every 12 hours 11/08/19 0021 11/08/19 0025   11/07/19 1930  vancomycin (VANCOREADY) IVPB 1250 mg/250 mL        1,250 mg 166.7 mL/hr over 90 Minutes Intravenous  Once 11/07/19 1858 11/07/19 2154   11/07/19 1900  ceFEPIme (MAXIPIME) 2 g in sodium chloride 0.9 % 100 mL IVPB        2 g 200 mL/hr over 30 Minutes Intravenous  Once 11/07/19 1854 11/07/19 2009   11/07/19 1900  metroNIDAZOLE (FLAGYL) IVPB 500 mg        500 mg 100 mL/hr over 60 Minutes Intravenous  Once 11/07/19 1854 11/07/19  2325   11/07/19 1900  vancomycin (VANCOCIN) IVPB 1000 mg/200 mL premix  Status:  Discontinued        1,000 mg 200 mL/hr over 60 Minutes Intravenous  Once 11/07/19 1854 11/07/19 1858       Objective: Vitals: Today's Vitals   11/18/19 0701 11/18/19 1053 11/18/19 1054 11/18/19 1109  BP:  107/61 107/61 111/74  Pulse:  88 88 71  Resp:      Temp:  98.4 F (36.9 C) 98.4 F (36.9 C) 98.4 F (36.9 C)  TempSrc:  Oral Oral Oral  SpO2:  97% 97% 97%  Weight:      Height:      PainSc: 8        Intake/Output Summary (Last 24 hours) at 11/18/2019 1243 Last data filed at 11/18/2019 1054 Gross per 24 hour  Intake 1544.37 ml  Output 3505 ml  Net -1960.63 ml   Filed Weights   11/15/19 0555  Weight: 54.8 kg   Weight change:    Intake/Output from previous day: 08/18 0701 - 08/19 0700 In: 2479.4 [P.O.:960; I.V.:1419.4; IV Piggyback:100] Out: 1324 [Urine:3790; Drains:15] Intake/Output this shift: Total I/O In: 400 [I.V.:100; Blood:300] Out: -   Examination: General exam: AAOx3, old for age, NAD, weak appearing. HEENT:Oral mucosa moist, Ear/Nose WNL grossly, dentition normal. Respiratory system: bilaterally clear,no wheezing or crackles,no use of accessory muscle Cardiovascular system: S1 & S2 +, No JVD,. Gastrointestinal system: Abdomen soft, NT,ND, BS+ Nervous System:Alert, awake, moving extremities and grossly nonfocal Extremities: No edema, distal peripheral pulses palpable.  Skin: No rashes,no icterus. MSK: Normal muscle bulk,tone, power Foley catheter present, pelvic drain present and right nephrostomy with urine output  Data Reviewed: I have personally reviewed following labs and imaging studies CBC: Recent Labs  Lab 11/14/19 0333 11/15/19 0341 11/16/19 0337 11/17/19 1109 11/18/19 0314  WBC 9.4 8.1 7.9 7.8 7.7  HGB 7.5* 7.3* 7.7* 7.3* 6.9*  HCT 24.6* 23.9* 25.5* 24.1* 23.2*  MCV 97.2 97.2 97.0 98.8 100.4*  PLT 116* 126* 139* 151 177   Basic  Metabolic  Panel: Recent Labs  Lab 11/12/19 0358 11/12/19 0358 11/13/19 0322 11/14/19 0333 11/15/19 0341 11/17/19 1109 11/18/19 0314  NA 143   < > 135 143 135 142 140  K 3.9   < > 3.4* 4.4 4.7 4.8 4.8  CL 113*   < > 109 114* 108 115* 110  CO2 18*   < > 18* 22 20* 20* 19*  GLUCOSE 79   < > 122* 90 89 93 84  BUN 54*   < > 50* 46* 43* 43* 40*  CREATININE 2.82*   < > 2.57* 2.52* 2.53* 2.66* 2.64*  CALCIUM 7.0*   < > 6.8* 7.4* 7.3* 7.5* 7.6*  MG 2.2  --  2.1 2.0 1.9  --   --    < > = values in this interval not displayed.   GFR: Estimated Creatinine Clearance: 24.2 mL/min (A) (by C-G formula based on SCr of 2.64 mg/dL (H)). Liver Function Tests: No results for input(s): AST, ALT, ALKPHOS, BILITOT, PROT, ALBUMIN in the last 168 hours. No results for input(s): LIPASE, AMYLASE in the last 168 hours. No results for input(s): AMMONIA in the last 168 hours. Coagulation Profile: No results for input(s): INR, PROTIME in the last 168 hours. Cardiac Enzymes: No results for input(s): CKTOTAL, CKMB, CKMBINDEX, TROPONINI in the last 168 hours. BNP (last 3 results) No results for input(s): PROBNP in the last 8760 hours. HbA1C: No results for input(s): HGBA1C in the last 72 hours. CBG: Recent Labs  Lab 11/15/19 1127 11/15/19 1845 11/15/19 2152 11/16/19 0758 11/16/19 1146  GLUCAP 77 80 158* 76 73   Lipid Profile: No results for input(s): CHOL, HDL, LDLCALC, TRIG, CHOLHDL, LDLDIRECT in the last 72 hours. Thyroid Function Tests: No results for input(s): TSH, T4TOTAL, FREET4, T3FREE, THYROIDAB in the last 72 hours. Anemia Panel: No results for input(s): VITAMINB12, FOLATE, FERRITIN, TIBC, IRON, RETICCTPCT in the last 72 hours. Sepsis Labs: No results for input(s): PROCALCITON, LATICACIDVEN in the last 168 hours.  Recent Results (from the past 240 hour(s))  Aerobic/Anaerobic Culture (surgical/deep wound)     Status: None (Preliminary result)   Collection Time: 11/15/19  6:09 PM   Specimen:  Abdomen; Abscess  Result Value Ref Range Status   Specimen Description   Final    ABDOMEN ABSCESS Performed at [facility] [address] [place], [place] [address]    Special Requests   Final    NONE Performed at [facility], [place] [address] [place], [place] [address]    Gram Stain   Final    ABUNDANT WBC PRESENT, PREDOMINANTLY PMN FEW GRAM NEGATIVE RODS RARE GRAM POSITIVE COCCI IN PAIRS Performed at [facility], [place] [address], [place], [place] [address]    Culture   Final    MODERATE ESCHERICHIA COLI MODERATE PSEUDOMONAS AERUGINOSA NO ANAEROBES ISOLATED; CULTURE IN PROGRESS FOR 5 DAYS    Report Status PENDING  Incomplete   Organism ID, Bacteria ESCHERICHIA COLI  Final   Organism ID, Bacteria PSEUDOMONAS AERUGINOSA  Final      Susceptibility   Escherichia coli - MIC*    AMPICILLIN >=32 RESISTANT Resistant     CEFAZOLIN >=64 RESISTANT Resistant     CEFEPIME 0.5 SENSITIVE Sensitive     CEFTAZIDIME 16 INTERMEDIATE Intermediate     CEFTRIAXONE >=64 RESISTANT Resistant     CIPROFLOXACIN <=0.25 SENSITIVE Sensitive     GENTAMICIN <=1 SENSITIVE Sensitive     IMIPENEM <=0.25 SENSITIVE Sensitive  TRIMETH/SULFA <=20 SENSITIVE Sensitive     AMPICILLIN/SULBACTAM 16 INTERMEDIATE Intermediate     PIP/TAZO 8 SENSITIVE Sensitive     * MODERATE ESCHERICHIA COLI   Pseudomonas aeruginosa - MIC*    CEFTAZIDIME 4 SENSITIVE Sensitive     CIPROFLOXACIN <=0.25 SENSITIVE Sensitive     GENTAMICIN <=1 SENSITIVE Sensitive     IMIPENEM 1 SENSITIVE Sensitive     PIP/TAZO 8 SENSITIVE Sensitive     CEFEPIME 2 SENSITIVE Sensitive     * MODERATE PSEUDOMONAS AERUGINOSA      Radiology Studies: IR NEPHROSTOMY PLACEMENT RIGHT  Result Date: 11/16/2019 INDICATION: 54 year old male with a history of Hodgkin's lymphoma and benign prostatic hyperplasia. Reportedly had a cystoscopy at an outside institution which revealed a bladder mass. He then presented  with bilateral hydronephrosis, urosepsis and hematuria. 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 A left double-J ureteral stent was successfully placed, however the right ureter was too tortuous for placement of a retrograde stent. Therefore, patient presents for placement of a right-sided percutaneous nephrostomy tube. EXAM: IR NEPHROSTOMY PLACEMENT RIGHT COMPARISON:  None. MEDICATIONS: 2 g Ancef; The antibiotic was administered in an appropriate time frame prior to skin puncture. ANESTHESIA/SEDATION: Fentanyl 2 mcg IV; Versed 100 mg IV Moderate Sedation Time:  12 minutes The patient was continuously monitored during the procedure by the interventional radiology nurse under my direct supervision. CONTRAST:  35mL OMNIPAQUE IOHEXOL 300 MG/ML SOLN - administered into the collecting system(s) FLUOROSCOPY TIME:  Fluoroscopy Time:  minutes  seconds ( mGy). COMPLICATIONS: None immediate. TECHNIQUE: The procedure, risks, benefits, and alternatives were explained to the patient. Questions regarding the procedure were encouraged and answered. The patient understands and consents to the procedure. The right flank was prepped with chlorhexidine in a sterile fashion, and a sterile drape was applied covering the operative field. A sterile gown and sterile gloves were used for the procedure. Local anesthesia was provided with 1% Lidocaine. The right flank was interrogated with ultrasound and the left kidney identified. The kidney is hydronephrotic. A suitable access site on the skin overlying the lower pole, posterior calix was identified. After local mg anesthesia was achieved, a small skin nick was made with an 11 blade scalpel. A 21 gauge Accustick needle was then advanced under direct sonographic guidance into the lower pole of the right kidney. A 0.018 inch wire was advanced under fluoroscopic guidance into the left renal collecting  system. The Accustick sheath was then advanced over the wire and a 0.018 system exchanged for a 0.035 system. Gentle hand injection of contrast material confirms placement of the sheath within the renal collecting system. There is moderate hydronephrosis. The tract from the scan into the renal collecting system was then dilated serially to 10-French. A 10-French Cook all-purpose drain was then placed and positioned under fluoroscopic guidance. The locking loop is well formed within the left renal pelvis. The catheter was secured to the skin with 2-0 Prolene and a sterile bandage was placed. Catheter was left to gravity bag drainage. IMPRESSION: Successful placement of a right 10 French percutaneous nephrostomy tube. Electronically Signed   By: Jacqulynn Cadet M.D.   On: 11/16/2019 14:08     LOS: 10 days   Antonieta Pert, MD [facility]  11/18/2019, 12:43 PM

## 2019-11-18 NOTE — Progress Notes (Signed)
PT Cancellation Note  Patient Details Name: Chase [name] MRN: [id number] DOB: 1965/11/19   Cancelled Treatment:    Reason Eval/Treat Not Completed: Medical issues which prohibited therapy, blood near completion, patient wants to eat his lunch. Will check back another time.   [name] 11/18/2019, 2:09 PM [place] Pager [phone] Office [phone]

## 2019-11-18 NOTE — Progress Notes (Signed)
Patient ID: Chase [name], male   DOB: 1965-08-19, 54 y.o.   MRN: [id number]  4 Days Post-Op Subjective: Pt feeling overall better.  Denies pain.  Objective: Vital signs in last 24 hours: Temp:  [97.6 F (36.4 C)-98.4 F (36.9 C)] 97.6 F (36.4 C) (08/19 0314) Pulse Rate:  [61-79] 61 (08/19 0314) Resp:  [14-16] 16 (08/19 0314) BP: (100-112)/(62-75) 112/73 (08/19 0314) SpO2:  [97 %-100 %] 98 % (08/19 0314)  Intake/Output from previous day: 08/18 0701 - 08/19 0700 In: 2479.4 [P.O.:960; I.V.:1419.4; IV Piggyback:100] Out: 3805 [Urine:3790; Drains:15] Intake/Output this shift: No intake/output data recorded.  Physical Exam:  General: Alert and oriented Abdomen: Soft, ND, Pelvic drain with purulent drainage still but not much in bag GU: R nephrostomy with clear drainage, urine mostly clear Ext: NT, No erythema  Lab Results: Recent Labs    11/16/19 0337 11/17/19 1109 11/18/19 0314  HGB 7.7* 7.3* 6.9*  HCT 25.5* 24.1* 23.2*   BMET Recent Labs    11/17/19 1109 11/18/19 0314  NA 142 140  K 4.8 4.8  CL 115* 110  CO2 20* 19*  GLUCOSE 93 84  BUN 43* 40*  CREATININE 2.66* 2.64*  CALCIUM 7.5* 7.6*     Studies/Results: IR NEPHROSTOMY PLACEMENT RIGHT  Result Date: 11/16/2019 INDICATION: 54 year old male with a history of Hodgkin's lymphoma and benign prostatic hyperplasia. Reportedly had a cystoscopy at an outside institution which revealed a bladder mass. He then presented with bilateral hydronephrosis, urosepsis and hematuria. 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 A left double-J ureteral stent was successfully placed, however the right ureter was too tortuous for placement of a retrograde stent. Therefore, patient presents for placement of a right-sided percutaneous nephrostomy tube. EXAM: IR NEPHROSTOMY PLACEMENT RIGHT COMPARISON:  None. MEDICATIONS: 2 g Ancef; The antibiotic  was administered in an appropriate time frame prior to skin puncture. ANESTHESIA/SEDATION: Fentanyl 2 mcg IV; Versed 100 mg IV Moderate Sedation Time:  12 minutes The patient was continuously monitored during the procedure by the interventional radiology nurse under my direct supervision. CONTRAST:  34mL OMNIPAQUE IOHEXOL 300 MG/ML SOLN - administered into the collecting system(s) FLUOROSCOPY TIME:  Fluoroscopy Time:  minutes  seconds ( mGy). COMPLICATIONS: None immediate. TECHNIQUE: The procedure, risks, benefits, and alternatives were explained to the patient. Questions regarding the procedure were encouraged and answered. The patient understands and consents to the procedure. The right flank was prepped with chlorhexidine in a sterile fashion, and a sterile drape was applied covering the operative field. A sterile gown and sterile gloves were used for the procedure. Local anesthesia was provided with 1% Lidocaine. The right flank was interrogated with ultrasound and the left kidney identified. The kidney is hydronephrotic. A suitable access site on the skin overlying the lower pole, posterior calix was identified. After local mg anesthesia was achieved, a small skin nick was made with an 11 blade scalpel. A 21 gauge Accustick needle was then advanced under direct sonographic guidance into the lower pole of the right kidney. A 0.018 inch wire was advanced under fluoroscopic guidance into the left renal collecting system. The Accustick sheath was then advanced over the wire and a 0.018 system exchanged for a 0.035 system. Gentle hand injection of contrast material confirms placement of the sheath within the renal collecting system. There is moderate hydronephrosis. The tract from the scan into the renal collecting system was then dilated serially to 10-French. A 10-French Cook all-purpose drain was  then placed and positioned under fluoroscopic guidance. The locking loop is well formed within the left renal pelvis.  The catheter was secured to the skin with 2-0 Prolene and a sterile bandage was placed. Catheter was left to gravity bag drainage. IMPRESSION: Successful placement of a right 10 French percutaneous nephrostomy tube. Electronically Signed   By: Jacqulynn Cadet M.D.   On: 11/16/2019 14:08    Abscess culture growing Pseudomonas.  Assessment/Plan: 1) Colovesical fistula with pelvic abscess: Drain output decreasing but still draining.  Management of drain and antibiotic recommendations per general surgery. Continue urethral catheter upon discharge and will schedule to have this changed in one month.  Will be prepared to assist general surgery with eventual surgical repair although I imagine this will be still in distant future to allow medical optimization and considering recent stroke in July.  2) Bilateral ureteral obstruction: Left stent in place.  Right nephrostomy also placed.  Will contact IR about plan for eventual antegrade stent placement once system is decompressed.  Will leave stents in place until definitive treatment of fistula.  3) Urinary retention:  Leave indwelling catheter until fistula repair.  Will need this changed in one month.    LOS: 10 days   Chase [name] 11/18/2019, 7:13 AM

## 2019-11-18 NOTE — Progress Notes (Signed)
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 Earleen Newport. Right hydronephrosis secondary to pelvic mass s/p right percutaneous nephrostomy tube placement in IR 11/16/2019 by Dr. Laurence Ferrari. Patient laying in bed resting comfortably. He responds to voice and answers questions appropriately. States he is tired today. Left pelvic drain and right nephrostomy tube sites c/d/i.   Allergies: Nsaids  Medications: Prior to Admission medications   Medication Sig Start Date End Date Taking? Authorizing Provider  cyanocobalamin (,VITAMIN B-12,) 1000 MCG/ML injection Inject 1,000 mcg into the muscle every 30 (thirty) days.  12/28/13  Yes [provider]  DULoxetine (CYMBALTA) 30 MG capsule Take 30-60 mg by mouth See admin instructions. Take 2 capsules in the morning and 1 capsule in the evening 08/12/17  Yes [provider]  feeding supplement, ENSURE ENLIVE, (ENSURE ENLIVE) LIQD Take 237 mLs by mouth 3 (three) times daily between meals. 10/26/19  Yes Mikhail, [place], DO  ferrous sulfate 325 (65 FE) MG tablet Take 325 mg by mouth daily.   Yes [provider]  gabapentin (NEURONTIN) 300 MG capsule Take 1 capsule (300 mg total) by mouth 2 (two) times daily. 10/26/19  Yes Mikhail, [place], DO  HYDROcodone-acetaminophen (NORCO/VICODIN) 5-325 MG tablet Take 1 tablet by mouth every 4 (four) hours as needed (pain).   Yes [provider]  hydrOXYzine (ATARAX/VISTARIL) 10 MG tablet Take 1 tablet (10 mg total) by mouth 3 (three) times daily as needed for itching or anxiety. 10/26/19  Yes Mikhail, Maryann, DO  midodrine (PROAMATINE) 2.5 MG tablet Take 1 tablet (2.5 mg total) by mouth 3 (three) times daily. 10/26/19  Yes Mikhail, [place], DO  Multiple Vitamin  (MULTIVITAMIN WITH MINERALS) TABS tablet Take 1 tablet by mouth daily. 10/27/19  Yes Mikhail, Maryann, DO  pantoprazole (PROTONIX) 40 MG tablet Take 40 mg by mouth 2 (two) times daily.  06/09/18  Yes [provider]  promethazine (PHENERGAN) 25 MG tablet Take 25 mg by mouth daily as needed for nausea or vomiting.   Yes [provider]  sodium bicarbonate 650 MG tablet Take 3 tablets (1,950 mg total) by mouth 3 (three) times daily. 10/26/19  Yes Mikhail, Velta [place], DO  tamsulosin (FLOMAX) 0.4 MG CAPS capsule Take 0.4 mg by mouth daily.    Yes [provider]  aspirin EC 81 MG EC tablet Take 1 tablet (81 mg total) by mouth daily. Swallow whole. Patient not taking: Reported on 11/07/2019 10/27/19   Cristal Ford, DO  traMADol (ULTRAM) 50 MG tablet Take 1 tablet (50 mg total) by mouth every 6 (six) hours as needed. Patient not taking: Reported on 11/07/2019 10/26/19   Cristal Ford, DO     Vital Signs: BP 110/66 (BP Location: Right Arm)   Pulse 66   Temp 98.4 F (36.9 C) (Oral)   Resp 16   Ht 5\' 8"  (1.727 m)   Wt 120 lb 13 oz (54.8 kg)   SpO2 100%   BMI 18.37 kg/m   Physical Exam Vitals and nursing note reviewed.  Constitutional:      General: He is not in acute distress. Pulmonary:     Effort: Pulmonary effort is normal. No respiratory distress.  Abdominal:     Comments: Left pelvic drain sith without tenderness, erythema, drainage, or active bleeding; approximately 25 cc purulent fluid  in gravity bag; drain flushes/aspirates without resistance. Right nephrostomy tube site without tenderness, erythema, drainage, or active bleeding; approximately 200 cc clear yellow urine in gravity bag.  Skin:    General: Skin is warm and dry.  Neurological:     Mental Status: He is oriented to person, place, and time.     Imaging: IR NEPHROSTOMY PLACEMENT RIGHT  Result Date: 11/16/2019 INDICATION: 54 year old male with a history of Hodgkin's lymphoma and benign prostatic  hyperplasia. Reportedly had a cystoscopy at an outside institution which revealed a bladder mass. He then presented with bilateral hydronephrosis, urosepsis and hematuria. 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 A left double-J ureteral stent was successfully placed, however the right ureter was too tortuous for placement of a retrograde stent. Therefore, patient presents for placement of a right-sided percutaneous nephrostomy tube. EXAM: IR NEPHROSTOMY PLACEMENT RIGHT COMPARISON:  None. MEDICATIONS: 2 g Ancef; The antibiotic was administered in an appropriate time frame prior to skin puncture. ANESTHESIA/SEDATION: Fentanyl 2 mcg IV; Versed 100 mg IV Moderate Sedation Time:  12 minutes The patient was continuously monitored during the procedure by the interventional radiology nurse under my direct supervision. CONTRAST:  25mL OMNIPAQUE IOHEXOL 300 MG/ML SOLN - administered into the collecting system(s) FLUOROSCOPY TIME:  Fluoroscopy Time:  minutes  seconds ( mGy). COMPLICATIONS: None immediate. TECHNIQUE: The procedure, risks, benefits, and alternatives were explained to the patient. Questions regarding the procedure were encouraged and answered. The patient understands and consents to the procedure. The right flank was prepped with chlorhexidine in a sterile fashion, and a sterile drape was applied covering the operative field. A sterile gown and sterile gloves were used for the procedure. Local anesthesia was provided with 1% Lidocaine. The right flank was interrogated with ultrasound and the left kidney identified. The kidney is hydronephrotic. A suitable access site on the skin overlying the lower pole, posterior calix was identified. After local mg anesthesia was achieved, a small skin nick was made with an 11 blade scalpel. A 21 gauge Accustick needle was then advanced under direct sonographic guidance into the lower  pole of the right kidney. A 0.018 inch wire was advanced under fluoroscopic guidance into the left renal collecting system. The Accustick sheath was then advanced over the wire and a 0.018 system exchanged for a 0.035 system. Gentle hand injection of contrast material confirms placement of the sheath within the renal collecting system. There is moderate hydronephrosis. The tract from the scan into the renal collecting system was then dilated serially to 10-French. A 10-French Cook all-purpose drain was then placed and positioned under fluoroscopic guidance. The locking loop is well formed within the left renal pelvis. The catheter was secured to the skin with 2-0 Prolene and a sterile bandage was placed. Catheter was left to gravity bag drainage. IMPRESSION: Successful placement of a right 10 French percutaneous nephrostomy tube. Electronically Signed   By: Jacqulynn Cadet M.D.   On: 11/16/2019 14:08   CT IMAGE GUIDED DRAINAGE BY PERCUTANEOUS CATHETER  Result Date: 11/16/2019 INDICATION: 54 year old male referred for pelvic abscess drainage, with evidence of colovesical fistula EXAM: CT GUIDED DRAINAGE OF  ABSCESS MEDICATIONS: The patient is currently admitted to the hospital and receiving intravenous antibiotics. The antibiotics were administered within an appropriate time frame prior to the initiation of the procedure. ANESTHESIA/SEDATION: 2.0 mg IV Versed 100 mcg IV Fentanyl Moderate Sedation Time:  12 minutes The patient was continuously monitored during the procedure by the interventional  radiology nurse under my direct supervision. COMPLICATIONS: None TECHNIQUE: Informed written consent was obtained from the patient after a thorough discussion of the procedural risks, benefits and alternatives. All questions were addressed. Maximal Sterile Barrier Technique was utilized including caps, mask, sterile gowns, sterile gloves, sterile drape, hand hygiene and skin antiseptic. A timeout was performed prior to  the initiation of the procedure. PROCEDURE: The operative field was prepped with Chlorhexidine in a sterile fashion, and a sterile drape was applied covering the operative field. A sterile gown and sterile gloves were used for the procedure. Local anesthesia was provided with 1% Lidocaine. Scout images were acquired in supine position. The patient is prepped and draped in the usual sterile fashion. 1% lidocaine was used for local anesthesia. Using modified Seldinger technique, a 12 French drain was placed into the pelvic abscess just above the bladder. Approximately 300 cc of purulent fluid aspirated. Culture was sent. Catheter was attached to gravity drainage. Patient tolerated the procedure well and remained hemodynamically stable throughout. No complications were encountered and no significant blood loss. FINDINGS: Initial CT demonstrates balloon retention urinary catheter. A left ureteral stent in position. Abscess within the pelvis just superior to the urinary bladder with enteric contrast in the dependent aspect. Air in the anti dependent aspect. Very small window for placement of the drain at the most cephalad aspect of the abscess. Twelve French drain was placed with the abscess decompressed. IMPRESSION: Status post CT-guided drainage of pelvic abscess. Signed, Dulcy Fanny. Dellia Nims, RPVI [facility] Electronically Signed   By: Corrie Mckusick D.O.   On: 11/16/2019 07:35    Labs:  CBC: Recent Labs    11/15/19 0341 11/16/19 0337 11/17/19 1109 11/18/19 0314  WBC 8.1 7.9 7.8 7.7  HGB 7.3* 7.7* 7.3* 6.9*  HCT 23.9* 25.5* 24.1* 23.2*  PLT 126* 139* 151 177    COAGS: Recent Labs    10/17/19 1241 10/17/19 1311 11/07/19 1836 11/07/19 1853 11/10/19 1052  INR 1.5* 1.6* 1.2  --  1.3*  APTT 43* 46*  --  31  --     BMP: Recent Labs    11/14/19 0333 11/15/19 0341 11/17/19 1109 11/18/19 0314  NA 143 135 142 140  K 4.4 4.7 4.8 4.8   CL 114* 108 115* 110  CO2 22 20* 20* 19*  GLUCOSE 90 89 93 84  BUN 46* 43* 43* 40*  CALCIUM 7.4* 7.3* 7.5* 7.6*  CREATININE 2.52* 2.53* 2.66* 2.64*  GFRNONAA 28* 27* 26* 26*  GFRAA 32* 32* 30* 30*    LIVER FUNCTION TESTS: Recent Labs    10/19/19 0336 10/20/19 0618 11/07/19 1836 11/10/19 0932  BILITOT 0.7 0.5 0.6 0.4  AST 24 26 36 17  ALT 19 19 53* 41  ALKPHOS 86 94 180* 111  PROT 4.6* 5.0* 7.5 5.6*  ALBUMIN 1.6* 1.6* 2.7* 1.8*    Assessment and Plan:  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 Earleen Newport. Left pelvic drain stable with approximately 25 cc purulent fluid in gravity bag (additional 15 cc output from drain in past 24 hours per chart). Continue current drain management- continue with Qshift flushes/monitor of output. Plan for repeat CT/possible drain injection when output <10 cc/day (assess for possible removal).   Right hydronephrosis secondary to pelvic mass s/p right percutaneous nephrostomy tube placement in IR 11/16/2019 by Dr. Laurence Ferrari. Right nephrostomy tube stable with approximately 200 cc clear yellow urine in  gravity bag. Continue current drain management. Dr. Earleen Newport discussed case with urology today- plan for conversion of right PCN to ureteral stent on outpatient basis (order placed to facilitate this, our schedulers to call patient to set up this procedure).   Further plans per [facility]/urology- appreciate and agree with management. IR to follow.   Electronically Signed: Earley Abide, PA-C 11/18/2019, 2:11 PM   I spent a total of 25 Minutes at the the patient's bedside AND on the patient's hospital floor or unit, greater than 50% of which was counseling/coordinating care for pelvic abscess s/p drain placement AND right hydronephrosis s/p right PCN placement.

## 2019-11-18 NOTE — Progress Notes (Signed)
4 Days Post-Op    GQ:[id number]  Subjective: NAEO. Pain mostly controlled but has history of chronic pain which still bothers him. Tolerating PO in small amounts and having BMs. Was on the bed pan during my exam. States he plans to go to rehab facility from hospital for CVA rehab - his goal is to walk again which he is currently unable to do.  Objective: Vital signs in last 24 hours: Temp:  [97.6 F (36.4 C)-98.4 F (36.9 C)] 97.6 F (36.4 C) (08/19 0314) Pulse Rate:  [61-79] 61 (08/19 0314) Resp:  [14-16] 16 (08/19 0314) BP: (100-112)/(62-75) 112/73 (08/19 0314) SpO2:  [97 %-100 %] 98 % (08/19 0314) Last BM Date: 11/17/19 472 p.o. 3205 urine Drain 10 BM x2 Afebrile, vital signs are stable No labs today Intake/Output from previous day: 08/18 0701 - 08/19 0700 In: 2479.4 [P.O.:960; I.V.:1419.4; IV Piggyback:100] Out: 3805 [Urine:3790; Drains:15] Intake/Output this shift: No intake/output data recorded.  General appearance: alert, cooperative, no distress and Pale/chronically ill GI: Soft, sore, right IR nephrostomy drain with clear fluid.  The midline IR drain pelvic abscess with decreased drainage, still brown purulent appearing. GU: foley in place with clear urine  Lab Results:  Recent Labs    11/17/19 1109 11/18/19 0314  WBC 7.8 7.7  HGB 7.3* 6.9*  HCT 24.1* 23.2*  PLT 151 177    BMET Recent Labs    11/17/19 1109 11/18/19 0314  NA 142 140  K 4.8 4.8  CL 115* 110  CO2 20* 19*  GLUCOSE 93 84  BUN 43* 40*  CREATININE 2.66* 2.64*  CALCIUM 7.5* 7.6*   PT/INR No results for input(s): LABPROT, INR in the last 72 hours.  No results for input(s): AST, ALT, ALKPHOS, BILITOT, PROT, ALBUMIN in the last 168 hours.   Lipase  No results found for: LIPASE   Medications: . Chlorhexidine Gluconate Cloth  6 each Topical Daily  . DULoxetine  60 mg Oral Daily  . feeding supplement  1 Container Oral QID  . ferrous sulfate  325 mg Oral Q breakfast  .  gabapentin  300 mg Oral BID  . midodrine  5 mg Oral TID AC  . multivitamin with minerals  1 tablet Oral Daily  . pantoprazole  40 mg Oral BID  . sodium chloride flush  5 mL Intracatheter Q8H  . tamsulosin  0.4 mg Oral Daily   . ceFEPime (MAXIPIME) IV 2 g (11/18/19 0826)  . lactated ringers 75 mL/hr at 11/18/19 0547    Assessment/Plan Sepsis Chronic indwelling catheter CKD -Creatinine 2.57>>2.52>>2.53 Hx CVA7/18/21 on ASA, LLE deficits  Chronic pain Hx GI bleed Hx PTSD Hx Hodgkin's lymphoma 2010 Anemia-H/H 6.9/23.2  Thrombocytopenia - resolved  COVID-19 illness 12/2018 - Outpatient management - CovidNegative8/10/2019 Mild malnutrition-prealbumin 18 Deconditioning  Hematuria, bilateral hydronephrosis S/P Cystoscopy, cystogram, bilateral retrograde pyelogram, left ureteral stent, fulguration of the bladder, 11/14/2019 Dr. Raynelle Bring, POD #4 S/P IR RIGHT PERC NEPHROSTOMY TUBE 11/16/19   Pelvic abscess - between posterior wall of the bladder and anterior wall of the recum with probable colovesical fistula S/P IR drain placement pelvic abscess 11/15/19, 15cc/24h   Urinary retention-Foley in place, continue per urology, UOP >3,700 cc/24h  FEN: IV fluids/regular diet ID: Rocephin 8/9/-8/10; Maxipime 8/8 x 1,8/11>>day 8 DVT: SCDs/off heparin for hematuria Follow-up: IR drain clinic, GI with colonoscopy in about 6 weeks.  Subsequent follow-up with [name] for probable robotic colectomy/repair of colovesicular fistula  Plan: medical management of above medical conditions by primary team,  follow-up with the IR drain clinic for pelvic drain.  He will need a colonoscopy and then follow-up with [name] for to discuss colorectal repair. Will likely need neurology clearance prior to surgery as well. I will arrange follow up with [name]. No acute surgical needs, general surgery will sign off and see the patient in our office.   [name] recommends a minimum of 5 days  of IV antibiotics.      LOS: 10 days    Jill Alexanders 11/18/2019 Please see Amion

## 2019-11-18 NOTE — Progress Notes (Addendum)
CRITICAL VALUE STICKER  CRITICAL VALUE: Hemoglobin 6.9  RECEIVER : [place] NOTIFIED: 11/18/19 at [place]  MESSENGER: Raelyn Ensign  MD NOTIFIED: on-call attending M. Denny via [place]: 11/18/19 at [place] for 1 unit of PRBC's

## 2019-11-19 LAB — CBC
HCT: 28.2 % — ABNORMAL LOW (ref 39.0–52.0)
Hemoglobin: 8.4 g/dL — ABNORMAL LOW (ref 13.0–17.0)
MCH: 29.2 pg (ref 26.0–34.0)
MCHC: 29.8 g/dL — ABNORMAL LOW (ref 30.0–36.0)
MCV: 97.9 fL (ref 80.0–100.0)
Platelets: 195 10*3/uL (ref 150–400)
RBC: 2.88 MIL/uL — ABNORMAL LOW (ref 4.22–5.81)
RDW: 20.7 % — ABNORMAL HIGH (ref 11.5–15.5)
WBC: 9.2 10*3/uL (ref 4.0–10.5)
nRBC: 0 % (ref 0.0–0.2)

## 2019-11-19 LAB — TYPE AND SCREEN
ABO/RH(D): O POS
Antibody Screen: NEGATIVE
Unit division: 0

## 2019-11-19 LAB — BASIC METABOLIC PANEL
Anion gap: 6 (ref 5–15)
Anion gap: 6 (ref 5–15)
BUN: 36 mg/dL — ABNORMAL HIGH (ref 6–20)
BUN: 38 mg/dL — ABNORMAL HIGH (ref 6–20)
CO2: 22 mmol/L (ref 22–32)
CO2: 22 mmol/L (ref 22–32)
Calcium: 7.8 mg/dL — ABNORMAL LOW (ref 8.9–10.3)
Calcium: 7.6 mg/dL — ABNORMAL LOW (ref 8.9–10.3)
Chloride: 113 mmol/L — ABNORMAL HIGH (ref 98–111)
Chloride: 112 mmol/L — ABNORMAL HIGH (ref 98–111)
Creatinine, Ser: 2.56 mg/dL — ABNORMAL HIGH (ref 0.61–1.24)
Creatinine, Ser: 2.41 mg/dL — ABNORMAL HIGH (ref 0.61–1.24)
GFR calc Af Amer: 31 mL/min — ABNORMAL LOW (ref >60)
GFR calc Af Amer: 34 mL/min — ABNORMAL LOW (ref >60)
GFR calc non Af Amer: 27 mL/min — ABNORMAL LOW (ref >60)
GFR calc non Af Amer: 29 mL/min — ABNORMAL LOW (ref >60)
Glucose, Bld: 84 mg/dL (ref 70–99)
Glucose, Bld: 90 mg/dL (ref 70–99)
Potassium: 5.3 mmol/L — ABNORMAL HIGH (ref 3.5–5.1)
Potassium: 5 mmol/L (ref 3.5–5.1)
Sodium: 141 mmol/L (ref 135–145)
Sodium: 140 mmol/L (ref 135–145)

## 2019-11-19 LAB — SARS CORONAVIRUS 2 BY RT PCR (HOSPITAL ORDER, PERFORMED IN ~~LOC~~ HOSPITAL LAB): SARS Coronavirus 2: NEGATIVE

## 2019-11-19 LAB — BPAM RBC
Blood Product Expiration Date: 202109162359
ISSUE DATE / TIME: 202108191046
Unit Type and Rh: 5100

## 2019-11-19 MED ORDER — MORPHINE SULFATE (PF) 2 MG/ML IV SOLN: 2.0000 mg | INTRAVENOUS | Status: DC | PRN

## 2019-11-19 MED ORDER — MIDODRINE HCL 2.5 MG PO TABS: 5.0000 mg | ORAL_TABLET | Freq: Three times a day (TID) | ORAL | Status: DC

## 2019-11-19 MED ORDER — HYDROCODONE-ACETAMINOPHEN 5-325 MG PO TABS
1.0000 | ORAL_TABLET | ORAL | Status: DC | PRN
  Administered 2019-11-19 (×2): 2 via ORAL
  Filled 2019-11-19 (×2): qty 2

## 2019-11-19 MED ORDER — CIPROFLOXACIN HCL 500 MG PO TABS: 500.0000 mg | ORAL_TABLET | Freq: Two times a day (BID) | ORAL | 0 refills | Status: AC

## 2019-11-19 MED ORDER — HYDROCODONE-ACETAMINOPHEN 5-325 MG PO TABS: 1.0000 | ORAL_TABLET | ORAL | 0 refills | Status: DC | PRN

## 2019-11-19 MED ORDER — DOCUSATE SODIUM 100 MG PO CAPS: 100.0000 mg | ORAL_CAPSULE | Freq: Two times a day (BID) | ORAL | 0 refills | Status: DC | PRN

## 2019-11-19 NOTE — Discharge Summary (Signed)
Physician Discharge Summary  Chase [name] VOH:[id number] DOB: Jul 10, 1965 DOA: 11/07/2019  PCP: [name]  Admit date: 11/07/2019 Discharge date: 11/19/2019  Admitted From: SNF Disposition:  SNF  Recommendations for Outpatient Follow-up:  1. Follow up with PCP in 1-2 weeks. 2. Follow up with Dr Johney Maine regarding colovesical surgery in 6 wk. 3. Follow up with IR Drain Clinic. 4. Please obtain BMP/CBC in one week. 5. Please follow up on the following pending results:  Home Health:NO  Equipment/Devices: NONE  Discharge Condition: Stable Code Status:   Code Status: Full Code Diet recommendation:  Diet Order            Diet - low sodium heart healthy           Diet regular Room service appropriate? Yes; Fluid consistency: Thin  Diet effective now                 Brief/Interim Summary: 54 year old with history of BPH, chronic indwelling Foley, acute CVA in July 2021, chronic pain, Hodgkin's lymphoma treated with ABVD and radiation, PTSD, chronic pain, CKD stage IIIa, gastric bypass, COVID-19 December 2018 admitted for SNF for hypotension and fever.Initially found to be in septic shock from urosepsis started on pressors, Urine culture with E coli and pseudomonas and Blood culture with E coli and proteus, has been on antibiotics then transition to cefepime. He  Reports he had hematuria from traumatic pulling of foley when it got caught during bed transfer. But he reports he has had hematuria before. 8/13 seen by urology and renal ultrasound shows "Large avascular heterogeneous intraluminal mass within the urinary bladder measuring up to 9.4 cm. Findings are most compatible with a large intraluminal blood clot in the setting of hematuria. Follow-up to resolution is recommended to exclude the presence of an underlying malignancy."8/15-underwent cystoscopy, bilateral retrograde pyelogram with left ureteral stent placement and fulguration of bladder, found to have bilateral  hydronephrosis and probable colovesical fistula with large abscess-S/p pelvic drain placement by IR 8/16 and Rt nephrostomy tube placement  8/17.  Seen by infectious disease at this time plan is for 14 days of TOTAL antibiotics patient will go on Cipro on discharge to complete the course.  Hematuria has resolved he is afebrile hemodynamically stable plan is for discharge to skilled nursing facility and then he will follow up with surgery and urology to address his colovesical fistula, and with the IR drain clinic for the drain management and the right nephrostomy tube management for eventual outpatient internalization  Discharge Diagnoses:  Principal Problem:   Pelvic abscess with colovescial fistula Active Problems:   CKD (chronic kidney disease), stage IV (HCC)   Chronic pain   Fall   Pseudomonas urinary tract infection   Cerebral embolism with cerebral infarction   Protein-calorie malnutrition, severe   Septic shock ([place])   BPH (benign prostatic hyperplasia)   Colovesical fistula   Acute on chronic renal failure (HCC)   Cervical spondylosis without myelopathy   History of COVID-19   Hodgkin lymphoma, unspecified, unspecified site ([place])   E. coli UTI (urinary tract infection)   PTSD (post-traumatic stress disorder)   Chemical exposure   Cervical radiculopathy  Septic shock 2/2 UTI, Urine culture with E coli and pseudomonas and Blood culture with E coli and proteus:Shock physiology resolved. Sepsis due to  colovesical fistula with large pelvic abscess-a/p pelvic drain placement by IR 8/16 and Rt nephrostomy tube placement  8/17 surgery advised minimum 5 days of IV antibiotics.  Continue drain management  as per IR preferably can remove with drain clinic output significantly improved otherwise may need to leave it in until the surgery per [facility]. pelvic drain cultures shows moderate E. coli sensitive to cefepime, Cipro, Bactrim, moderate Pseudomonas aeruginosa-pansensitive.  Seen by ID   he will  need drain clinic f/u, and will need colonoscopy and then follow-up with [name] for colorectal repair and has advised evaluation by surgery and cardiology per op.  IR will plan on outpatient  internalization of the right nephrostomy tube.  AKI on CKD stage IIIa with chronic urine retention in the setting of BPH. Baseline creatinine 2.3 peaked to 3.0.He does have bilateral hydronephrosis, left ureteric stent placed 8/16 and  rt right nephrostomy tubeby IR 8/17- foley +:Urine from both sites  draining well but appears clear without hematuria and good output.  Renal function overall stable.   Recent Labs  Lab 11/15/19 0341 11/17/19 1109 11/18/19 0314 11/19/19 0308 11/19/19 1030  BUN 43* 43* 40* 36* 38*  CREATININE 2.53* 2.66* 2.64* 2.56* 2.41*   Bilateral ureteral obstruction left stent in place, right nephrostomy placed by IR follow-up with IR for outpatient.  FOR RT nephrostomy tube internalization.  She wants to keep the stent in place until fistula surgery   Borderline high potassium stable on recheck.  Repeat BMP in 1 week  Acute blood loss anemia due to hematuria in the setting of chronic anemia.  He required 2 units PRBC total.  Hemoglobin is stable.  hopefully will not drop given hematuria has resolved Recent Labs  Lab 11/15/19 0341 11/16/19 0337 11/17/19 1109 11/18/19 0314 11/19/19 0308  HGB 7.3* 7.7* 7.3* 6.9* 8.4*  HCT 23.9* 25.5* 24.1* 23.2* 28.2*   Recent CVA in July 2021 on aspirin: Unable to move his leg but only moving toes on the left, right side stable. Continue PT OT as tolerated  Chronic pain: cont on norco, Cymbalta and Neurontin. Also getting iv morphine.   Adult failure to thrive with history of gastric bypass, sacral stage II pressure ulcer no signs of infection.  Augment nutritional status.  PT OT evaluation   GERD continue PPI   Consults:  ID, UROLOGY,  PCCM, SURGERY  Subjective: Resting well no nausea vomiting. No fever.  Hematuria resolved.   Agreed for discharge to skilled nursing facility today.  Discharge Exam: Vitals:   11/18/19 2125 11/19/19 0528  BP: 120/76 116/76  Pulse: (!) 59 65  Resp: 15 16  Temp: 98.6 F (37 C) 97.9 F (36.6 C)  SpO2: 100% 99%   General: Pt is alert, awake, not in acute distress Cardiovascular: RRR, S1/S2 +, no rubs, no gallops Respiratory: CTA bilaterally, no wheezing, no rhonchi Abdominal: Soft, NT, ND, bowel sounds + Extremities: no edema, no cyanosis  Discharge Instructions  Discharge Instructions    Diet - low sodium heart healthy   Complete by: As directed    Discharge instructions   Complete by: As directed    Please call call MD or return to ER for similar or worsening recurring problem that brought you to hospital or if any fever,nausea/vomiting,abdominal pain, uncontrolled pain, chest pain,  shortness of breath or any other alarming symptoms.  You will need to follow-up with [name] from general surgery for colovesical fistula repair , call office in 1-2 wks , you will need to follow-up with the IR drain clinic for the management of the pelvic drain in the right nephrostomy tube to convert in to internal stent.  Will need to follow-up with  urology Dr. Alinda Money and please call the office.   Please follow-up your doctor as instructed in a week time and call the office for appointment.  Please avoid alcohol, smoking, or any other illicit substance and maintain healthy habits including taking your regular medications as prescribed.  You were cared for by a hospitalist during your hospital stay. If you have any questions about your discharge medications or the care you received while you were in the hospital after you are discharged, you can call the unit and ask to speak with the hospitalist on call if the hospitalist that took care of you is not available.  Once you are discharged, your primary care physician will handle any further medical issues. Please note that NO REFILLS for any  discharge medications will be authorized once you are discharged, as it is imperative that you return to your primary care physician (or establish a relationship with a primary care physician if you do not have one) for your aftercare needs so that they can reassess your need for medications and monitor your lab values   Discharge wound care:   Complete by: As directed    Silicone foam dressing to the sacrum/coccyx change every 3 days. Assess under dressings each shift for any acute changes in the wounds.   Increase activity slowly   Complete by: As directed      Allergies as of 11/19/2019      Reactions   Nsaids Other (See Comments)   Bleeding GI Ulceration      Medication List    STOP taking these medications   sodium bicarbonate 650 MG tablet   traMADol 50 MG tablet Commonly known as: ULTRAM     TAKE these medications   aspirin 81 MG EC tablet Take 1 tablet (81 mg total) by mouth daily. Swallow whole.   ciprofloxacin 500 MG tablet Commonly known as: Cipro Take 1 tablet (500 mg total) by mouth 2 (two) times daily for 5 days.   cyanocobalamin 1000 MCG/ML injection Commonly known as: (VITAMIN B-12) Inject 1,000 mcg into the muscle every 30 (thirty) days.   docusate sodium 100 MG capsule Commonly known as: COLACE Take 1 capsule (100 mg total) by mouth 2 (two) times daily as needed for mild constipation.   DULoxetine 30 MG capsule Commonly known as: CYMBALTA Take 30-60 mg by mouth See admin instructions. Take 2 capsules in the morning and 1 capsule in the evening   feeding supplement (ENSURE ENLIVE) Liqd Take 237 mLs by mouth 3 (three) times daily between meals.   ferrous sulfate 325 (65 FE) MG tablet Take 325 mg by mouth daily.   gabapentin 300 MG capsule Commonly known as: NEURONTIN Take 1 capsule (300 mg total) by mouth 2 (two) times daily.   HYDROcodone-acetaminophen 5-325 MG tablet Commonly known as: NORCO/VICODIN Take 1-2 tablets by mouth every 4 (four) hours  as needed for up to 10 doses for moderate pain or severe pain (1 moderate, 2 severe). What changed:   how much to take  reasons to take this   hydrOXYzine 10 MG tablet Commonly known as: ATARAX/VISTARIL Take 1 tablet (10 mg total) by mouth 3 (three) times daily as needed for itching or anxiety.   midodrine 2.5 MG tablet Commonly known as: PROAMATINE Take 2 tablets (5 mg total) by mouth 3 (three) times daily with meals. What changed:   how much to take  when to take this   multivitamin with minerals Tabs tablet Take 1 tablet by mouth  daily.   pantoprazole 40 MG tablet Commonly known as: PROTONIX Take 40 mg by mouth 2 (two) times daily.   promethazine 25 MG tablet Commonly known as: PHENERGAN Take 25 mg by mouth daily as needed for nausea or vomiting.   tamsulosin 0.4 MG Caps capsule Commonly known as: FLOMAX Take 0.4 mg by mouth daily.            Discharge Care Instructions  (From admission, onward)         Start     Ordered   11/19/19 0000  Discharge wound care:       Comments: Silicone foam dressing to the sacrum/coccyx change every 3 days. Assess under dressings each shift for any acute changes in the wounds.   11/19/19 1329          Follow-up Information    Michael Boston, MD. Go on 12/27/2019.   Specialty: General Surgery Why: at 9:30 AM to discuss surgical repair of your colovesical fistula.  Contact information: [address] [place] [address] [phone]        Raynelle Bring, MD. Call in 1 week(s).   Specialty: Urology Contact information: [place] [address] [phone]        [place]. Call.   Why: For drain management Contact information: [place] [address] [phone]              Allergies  Allergen Reactions  . Nsaids Other (See Comments)    Bleeding GI Ulceration    The results of significant diagnostics from this hospitalization  (including imaging, microbiology, ancillary and laboratory) are listed below for reference.    Microbiology: Recent Results (from the past 240 hour(s))  Aerobic/Anaerobic Culture (surgical/deep wound)     Status: None (Preliminary result)   Collection Time: 11/15/19  6:09 PM   Specimen: Abdomen; Abscess  Result Value Ref Range Status   Specimen Description   Final    ABDOMEN ABSCESS Performed at [facility] [address] [place],  [place] [address]    Special Requests   Final    NONE Performed at [facility], [place] [address], [place], [place] [address]    Gram Stain   Final    ABUNDANT WBC PRESENT, PREDOMINANTLY PMN FEW GRAM NEGATIVE RODS RARE GRAM POSITIVE COCCI IN PAIRS Performed at [facility], [place] [address] [place], [place] [address]    Culture   Final    MODERATE ESCHERICHIA COLI MODERATE PSEUDOMONAS AERUGINOSA NO ANAEROBES ISOLATED; CULTURE IN PROGRESS FOR 5 DAYS    Report Status PENDING  Incomplete   Organism ID, Bacteria ESCHERICHIA COLI  Final   Organism ID, Bacteria PSEUDOMONAS AERUGINOSA  Final      Susceptibility   Escherichia coli - MIC*    AMPICILLIN >=32 RESISTANT Resistant     CEFAZOLIN >=64 RESISTANT Resistant     CEFEPIME 0.5 SENSITIVE Sensitive     CEFTAZIDIME 16 INTERMEDIATE Intermediate     CEFTRIAXONE >=64 RESISTANT Resistant     CIPROFLOXACIN <=0.25 SENSITIVE Sensitive     GENTAMICIN <=1 SENSITIVE Sensitive     IMIPENEM <=0.25 SENSITIVE Sensitive     TRIMETH/SULFA <=20 SENSITIVE Sensitive     AMPICILLIN/SULBACTAM 16 INTERMEDIATE Intermediate     PIP/TAZO 8 SENSITIVE Sensitive     * MODERATE ESCHERICHIA COLI   Pseudomonas aeruginosa - MIC*    CEFTAZIDIME 4 SENSITIVE Sensitive     CIPROFLOXACIN <=0.25  SENSITIVE Sensitive     GENTAMICIN <=1 SENSITIVE Sensitive     IMIPENEM 1 SENSITIVE Sensitive     PIP/TAZO 8 SENSITIVE Sensitive     CEFEPIME 2 SENSITIVE Sensitive     * MODERATE PSEUDOMONAS  AERUGINOSA  SARS Coronavirus 2 by RT PCR (hospital order, performed in [place] hospital lab) Nasopharyngeal Nasopharyngeal Swab     Status: None   Collection Time: 11/19/19 10:28 AM   Specimen: Nasopharyngeal Swab  Result Value Ref Range Status   SARS Coronavirus 2 NEGATIVE NEGATIVE Final    Comment: (NOTE) SARS-CoV-2 target nucleic acids are NOT DETECTED.  The SARS-CoV-2 RNA is generally detectable in upper and lower respiratory specimens during the acute phase of infection. The lowest concentration of SARS-CoV-2 viral copies this assay can detect is 250 copies / mL. A negative result does not preclude SARS-CoV-2 infection and should not be used as the sole basis for treatment or other patient management decisions.  A negative result may occur with improper specimen collection / handling, submission of specimen other than nasopharyngeal swab, presence of viral mutation(s) within the areas targeted by this assay, and inadequate number of viral copies (<250 copies / mL). A negative result must be combined with clinical observations, patient history, and epidemiological information.  Fact Sheet for Patients:   StrictlyIdeas.no  Fact Sheet for Healthcare Providers: BankingDealers.co.za  This test is not yet approved or  cleared by the [place] FDA and has been authorized for detection and/or diagnosis of SARS-CoV-2 by FDA under an Emergency Use Authorization (EUA).  This EUA will remain in effect (meaning this test can be used) for the duration of the COVID-19 declaration under Section 564(b)(1) of the Act, 21 U.S.C. section 360bbb-3(b)(1), unless the authorization is terminated or revoked sooner.  Performed at [facility], [place] [address], [place], [place] [address]     Procedures/Studies: CT ABDOMEN PELVIS WO CONTRAST  Result Date: 11/07/2019 CLINICAL DATA:  54 year old male with sepsis. Fever of 103  today. EXAM: CT ABDOMEN AND PELVIS WITHOUT CONTRAST TECHNIQUE: Multidetector CT imaging of the abdomen and pelvis was performed following the standard protocol without IV contrast. COMPARISON:  Noncontrast chest CT today reported separately. Chest CTA 10/22/2019. FINDINGS: Lower chest: Reported separately today. Hepatobiliary: Absent gallbladder.  Negative noncontrast liver. Pancreas: Negative noncontrast pancreas. Spleen: Negative. Adrenals/Urinary Tract: Mild bilateral adrenal gland thickening. Mild to moderate bilateral hydronephrosis. Hydroureter is more apparent on the right. Punctate right lower pole nephrolithiasis. No ureteral calculus identified. The bladder is abnormal with circumferential wall thickening, and best seen on sagittal image 95 with narrow windows there seems to be either a central bladder septation, or some kind of superimposed fluid collection superimposed on the bladder dome. The Foley terminates in the more caudal area. There is more complex urine density in the more dorsal/superior component (18 Hounsfield units). There is a surgical clip within or along the posterior bladder wall, and there are also multiple surgical clips in the space of Retzius including across midline to the right. Foley catheter associated gas is primarily located within the lower section, although a punctate focus of gas might be within the septation as seen on series 6, image 71. The ureterovesical junctions cannot be confidently delineated. Stomach/Bowel: Retained gas and stool throughout the large bowel. The sigmoid colon and both colonic flexures are at the upper limits of normal. The right colon is more decompressed. Possible normal appendix on series 2, image 70. No pericecal inflammation. No superimposed dilated small bowel. A  small volume of oral contrast is present in the proximal jejunum. Evidence of chronic gastrojejunostomy, distal gastrectomy. The mid and distal duodenum remain in place. No free air or  free fluid. Vascular/Lymphatic: Vascular patency is not evaluated in the absence of IV contrast. Mild Aortoiliac calcified atherosclerosis. No lymphadenopathy. Streak Reproductive: Urethral catheter in place. Chronic postoperative changes to the left inguinal region and the space of Retzius in the midline with numerous surgical clips. Other: No pelvic free fluid. Musculoskeletal: Bilateral lower rib fractures which were present last month and are reported separately today. Lumbar vertebrae appear intact. Sacrum, SI joints and coccygeal segments appear intact. Pelvis and proximal femurs are intact. No discrete sacral decubitus ulcer is evident. IMPRESSION: 1. Bilateral hydronephrosis with abnormal urinary bladder wall thickening suggesting UTI, but also there is either a thick central septation which compartmentalizes the bladder into two regions, or an equal sized complex fluid collection adherent to the bladder dome. See sagittal image 95. Follow-up Pelvis CT Cystogram (CT of the pelvis after dilute contrast is instilled into the urinary bladder via the Foley catheter) would be valuable to determine whether the two fluid spaces communicate. 2. No other acute or inflammatory process in the non-contrast abdomen or pelvis. Gas and stool in the large bowel suggests either constipation or decreased colonic motility. Sequelae of gastrojejunostomy. Electronically Signed   By: Genevie Ann M.D.   On: 11/07/2019 23:22   DG Chest 1 View  Result Date: 10/20/2019 CLINICAL DATA:  Elevated D-dimer. History of chemical exposure, GI bleeding. EXAM: CHEST  1 VIEW COMPARISON:  10/17/2019 FINDINGS: Shallow lung inflation. Heart size is normal. There are no focal consolidations or pleural effusions. IMPRESSION: Shallow inflation.  No evidence for acute  abnormality. Electronically Signed   By: Nolon Nations M.D.   On: 10/20/2019 18:44   CT Chest Wo Contrast  Result Date: 11/07/2019 CLINICAL DATA:  54 year old male with sepsis.   Fever of 103 today. EXAM: CT CHEST WITHOUT CONTRAST TECHNIQUE: Multidetector CT imaging of the chest was performed following the standard protocol without IV contrast. COMPARISON:  Portable chest earlier today. CT Abdomen and Pelvis today reported separately. Prior chest CTA 10/22/2019. FINDINGS: Cardiovascular: No cardiomegaly or pericardial effusion. Vascular patency is not evaluated in the absence of IV contrast. Mediastinum/Nodes: Negative, no lymphadenopathy is evident. Lungs/Pleura: Bilateral pleural effusions appear largely resolved since last month. Major airways are patent. Improved bilateral lower lobe ventilation. There is only mild linear atelectasis or scarring in the lateral basal segment of the left lower lobe. There is a tiny calcified granuloma in the right lower lobe. Upper Abdomen: Reported separately today. Musculoskeletal: Stable thoracic spine degeneration. Mildly displaced fractures of the posterior right 9th rib and right 10th rib costovertebral junction are redemonstrated. The left lateral 7th through 9th rib fractures remain largely nondisplaced. No new osseous abnormality identified. IMPRESSION: 1. No acute or inflammatory process in the non-contrast chest. Bilateral pleural effusions have resolved since last month. 2. Bilateral lower rib fractures, stable from last month. 3. CT Abdomen and Pelvis today reported separately. Electronically Signed   By: Genevie Ann M.D.   On: 11/07/2019 22:55   CT ANGIO CHEST PE W OR WO CONTRAST  Result Date: 10/22/2019 CLINICAL DATA:  Masslike structure around right pulmonary artery during echocardiography, positive D-dimer EXAM: CT ANGIOGRAPHY CHEST WITH CONTRAST TECHNIQUE: Multidetector CT imaging of the chest was performed using the standard protocol during bolus administration of intravenous contrast. Multiplanar CT image reconstructions and MIPs were obtained to evaluate the vascular  anatomy. CONTRAST:  6mL OMNIPAQUE IOHEXOL 350 MG/ML SOLN COMPARISON:   10/20/2019 FINDINGS: Cardiovascular: This is a technically adequate evaluation of the pulmonary vasculature. There are no filling defects or pulmonary emboli. The pulmonary artery is normal in caliber. I do not see any abnormalities associated with the right pulmonary artery to explain the reported echocardiography findings. There is a trace pericardial effusion. Evaluation of the thoracic aorta is limited due to the timing of contrast bolus. No evidence of thoracic aortic aneurysm. Mediastinum/Nodes: No enlarged mediastinal, hilar, or axillary lymph nodes. Thyroid gland, trachea, and esophagus demonstrate no significant findings. Lungs/Pleura: There are small bilateral pleural effusions, volume estimated less than 500 cc each. Dependent lower lobe atelectasis. No airspace disease or pneumothorax. The central airways are patent. Upper Abdomen: Trace ascites is noted throughout the upper abdomen. Colonic interposition right upper quadrant. Postsurgical changes from previous gastric surgery. Musculoskeletal: The patient is cachectic. There are nondisplaced right posterolateral eighth and ninth rib fractures. There are nondisplaced left anterolateral seventh and eighth rib fractures. Reconstructed images demonstrate no additional findings. Review of the MIP images confirms the above findings. IMPRESSION: 1. No evidence of pulmonary embolus. I do not see any abnormalities associated with the right pulmonary artery to explain the reported echocardiography findings. 2. Small bilateral pleural effusions. 3. Trace ascites. 4. Nondisplaced bilateral rib fractures. Electronically Signed   By: Randa Ngo M.D.   On: 10/22/2019 00:37   CT PELVIS WO CONTRAST  Result Date: 11/14/2019 CLINICAL DATA:  Possible colovesical fistula EXAM: CT PELVIS WITHOUT CONTRAST TECHNIQUE: Multidetector CT imaging of the pelvis was performed following the standard protocol without intravenous contrast. Additionally a subsequent images were  obtained following installation of dilute contrast material into the urinary bladder via the indwelling Foley catheter. COMPARISON:  Ultrasound from 11/12/2019, CT from 11/07/2019 and recent cystoscopy films from earlier in the same day. FINDINGS: Urinary Tract: The initial images obtained through the pelvis demonstrate the Foley catheter in a relatively decompressed urinary bladder displaced anteriorly. A left ureteral stent is noted in place and there is contrast material within the right ureter related to the recent retrograde pyelogram. The bladder is significantly thickened circumferentially and a large fluid collection is noted with dependent density within which measures approximately 9.5 x 8.5 cm in greatest transverse and AP dimensions respectively. This shows considerable contrast within the posterior more solid portion with extension towards the colon as well as a considerable amount of contrast within the rectosigmoid related to the recent injection during cystoscopy. Subsequently the bladder was instilled with contrast material which distended the bladder somewhat and better delineated the walls of the bladder. Considerable trabeculation is noted. There is a rent identified in the posterior aspect of the bladder which corresponds with that seen on cystoscopy with extension of contrast material into the large abscess identified posteriorly. The more solid portion of this fluid collection may represent some hematoma within the abscess collection. There is irregularity noted posteriorly which extends towards the colon and opacifies the colon although a definitive tract between the colon and this collection is not well appreciated on this particular exam. Bowel: The rectosigmoid demonstrates contrast within from prior cystoscopic injection. There is an area with anterior to the rectum where there is loss of the normal fat plane between the known collection posterior to the bladder and the rectum likely the  site of the fistula extending to the colon. A surgical clip is noted in this region and the fat plane is more blurred when compared with  the prior CT examination from 11/07/2019. The remainder of the visualized bowel appears within normal limits. Vascular/Lymphatic: Atherosclerotic calcifications are noted without aneurysmal dilatation. Reproductive:  Prostate appears within normal limits. Other: As described above, there is a large abscess interposed between the rectum and the decompressed bladder which was seen on the prior ultrasound examination but originally thought to represent thrombus within the bladder as opposed to a posterior collection in the pelvis. Musculoskeletal: No acute bony abnormality is noted. IMPRESSION: Large air-fluid collection consistent with abscess interposed between the posterior wall of the bladder in the anterior wall of the rectum. Some solid material is noted within likely representing thrombus. 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 Left ureteral stent is noted. Critical Value/emergent results were called by telephone at the time of interpretation on 11/14/2019 at 11:37 am to Dr. Raynelle Bring , who verbally acknowledged these results. Electronically Signed   By: Inez Catalina M.D.   On: 11/14/2019 11:37   NM Pulmonary Perfusion  Result Date: 10/20/2019 CLINICAL DATA:  54 year old male with concern for pulmonary embolism. Positive D-dimer. EXAM: NUCLEAR MEDICINE PERFUSION LUNG SCAN TECHNIQUE: Perfusion images were obtained in multiple projections after intravenous injection of radiopharmaceutical. Ventilation scans intentionally deferred if perfusion scan and chest x-ray adequate for interpretation during COVID 19 epidemic. RADIOPHARMACEUTICALS:  4.2 mCi  Tc-41m MAA IV COMPARISON:  Chest radiograph dated 10/20/2019. FINDINGS: There is homogeneous perfusion of the lungs. No focal defect identified. IMPRESSION: Normal perfusion scan. Electronically Signed   By: Anner Crete M.D.   On: 10/20/2019 18:41   US RENAL  Result Date: 11/12/2019 CLINICAL DATA:  Hydronephrosis EXAM: RENAL / URINARY TRACT ULTRASOUND COMPLETE COMPARISON:  CT 11/07/2019 FINDINGS: Right Kidney: Renal measurements: 9.3 x 5.2 x 6.3 cm = volume: 160 mL . Echogenicity within normal limits. No mass or shadowing stone visualized. Mild hydronephrosis. Left Kidney: Renal measurements: 10.9 x 6.7 x 7.5 cm = volume: 284 mL. Echogenicity within normal limits. No mass or shadowing stone visualized. Mild hydronephrosis. Bladder: There is a large heterogeneous intraluminal mass within the urinary bladder measuring approximately 9.4 x 5.0 x 5.2 cm. No internal vascularity is seen within the mass with color Doppler. Other: Small volume ascites incidentally noted. IMPRESSION: 1. Large avascular heterogeneous intraluminal mass within the urinary bladder measuring up to 9.4 cm. Findings are most compatible with a large intraluminal blood clot in the setting of hematuria. Follow-up to resolution is recommended to exclude the presence of an underlying malignancy. 2. Mild bilateral hydronephrosis. 3. Small volume ascites. Electronically Signed   By: Davina Poke D.O.   On: 11/12/2019 15:28   DG CHEST PORT 1 VIEW  Result Date: 11/08/2019 CLINICAL DATA:  Sepsis EXAM: PORTABLE CHEST 1 VIEW COMPARISON:  November 07, 2019 FINDINGS: The heart size and mediastinal contours are within normal limits. Interval placement of a right-sided central venous catheter with the tip at the superior cavoatrial junction. Both lungs are clear. The visualized skeletal structures are unremarkable. IMPRESSION: No active disease. Right-sided central venous catheter with the tip at the superior cavoatrial junction. Electronically Signed    By: Prudencio Pair M.D.   On: 11/08/2019 02:09   DG Chest Portable 1 View  Result Date: 11/07/2019 CLINICAL DATA:  54 year old male with sepsis, fever of 103 today. 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 EXAM: PORTABLE CHEST 1 VIEW COMPARISON:  CTA chest 10/22/2019 and earlier. FINDINGS: Portable AP semi upright view at 1912 hours. Stable somewhat low lung volumes. No pleural effusion is evident today. Allowing for portable technique the lungs are clear. Mediastinal contours remain normal. Visualized tracheal air column is within normal limits. Negative visible bowel gas pattern. Stable surgical clips in the right upper quadrant. No acute osseous abnormality identified. IMPRESSION: No acute cardiopulmonary abnormality. Electronically Signed   By: Genevie Ann M.D.   On: 11/07/2019 19:23   DG C-Arm 1-60 Min-No Report  Result Date: 11/14/2019 Fluoroscopy was utilized by the requesting physician.  No radiographic interpretation.   IR NEPHROSTOMY PLACEMENT RIGHT  Result Date: 11/16/2019 INDICATION: 54 year old male with a history of Hodgkin's lymphoma and benign prostatic hyperplasia. Reportedly had a cystoscopy at an outside institution which revealed a bladder mass. He then presented with bilateral hydronephrosis, urosepsis and hematuria. 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 A left double-J ureteral stent was successfully placed, however the right ureter was too tortuous for placement of a retrograde stent. Therefore, patient presents for placement of a right-sided percutaneous nephrostomy tube. EXAM: IR NEPHROSTOMY PLACEMENT RIGHT COMPARISON:  None. MEDICATIONS: 2 g Ancef; The antibiotic was administered in an appropriate time frame prior to skin puncture. ANESTHESIA/SEDATION: Fentanyl 2 mcg IV; Versed 100 mg IV Moderate Sedation Time:  12 minutes The patient was continuously monitored during the  procedure by the interventional radiology nurse under my direct supervision. CONTRAST:  72mL OMNIPAQUE IOHEXOL 300 MG/ML SOLN - administered into the collecting system(s) FLUOROSCOPY TIME:  Fluoroscopy Time:  minutes  seconds ( mGy). COMPLICATIONS: None immediate. TECHNIQUE: The procedure, risks, benefits, and alternatives were explained to the patient. Questions regarding the procedure were encouraged and answered. The patient understands and consents to the procedure. The right flank was prepped with chlorhexidine in a sterile fashion, and a sterile drape was applied covering the operative field. A sterile gown and sterile gloves were used for the procedure. Local anesthesia was provided with 1% Lidocaine. The right flank was interrogated with ultrasound and the left kidney identified. The kidney is hydronephrotic. A suitable access site on the skin overlying the lower pole, posterior calix was identified. After local mg anesthesia was achieved, a small skin nick was made with an 11 blade scalpel. A 21 gauge Accustick needle was then advanced under direct sonographic guidance into the lower pole of the right kidney. A 0.018 inch wire was advanced under fluoroscopic guidance into the left renal collecting system. The Accustick sheath was then advanced over the wire and a 0.018 system exchanged for a 0.035 system. Gentle hand injection of contrast material confirms placement of the sheath within the renal collecting system. There is moderate hydronephrosis. The tract from the scan into the renal collecting system was then dilated serially to 10-French. A 10-French Cook all-purpose drain was then placed and positioned under fluoroscopic guidance. The locking loop is well formed within the left renal pelvis. The catheter was secured to the skin with 2-0 Prolene and a sterile bandage was placed. Catheter was left to gravity bag drainage. IMPRESSION: Successful placement of a right 10 French percutaneous nephrostomy  tube. Electronically Signed   By: Jacqulynn Cadet M.D.   On: 11/16/2019 14:08   CT IMAGE GUIDED DRAINAGE BY PERCUTANEOUS CATHETER  Result Date: 11/16/2019 INDICATION: 54 year old male referred for pelvic abscess drainage, with evidence of colovesical fistula EXAM: CT  GUIDED DRAINAGE OF  ABSCESS MEDICATIONS: The patient is currently admitted to the hospital and receiving intravenous antibiotics. The antibiotics were administered within an appropriate time frame prior to the initiation of the procedure. ANESTHESIA/SEDATION: 2.0 mg IV Versed 100 mcg IV Fentanyl Moderate Sedation Time:  12 minutes The patient was continuously monitored during the procedure by the interventional radiology nurse under my direct supervision. COMPLICATIONS: None TECHNIQUE: Informed written consent was obtained from the patient after a thorough discussion of the procedural risks, benefits and alternatives. All questions were addressed. Maximal Sterile Barrier Technique was utilized including caps, mask, sterile gowns, sterile gloves, sterile drape, hand hygiene and skin antiseptic. A timeout was performed prior to the initiation of the procedure. PROCEDURE: The operative field was prepped with Chlorhexidine in a sterile fashion, and a sterile drape was applied covering the operative field. A sterile gown and sterile gloves were used for the procedure. Local anesthesia was provided with 1% Lidocaine. Scout images were acquired in supine position. The patient is prepped and draped in the usual sterile fashion. 1% lidocaine was used for local anesthesia. Using modified Seldinger technique, a 12 French drain was placed into the pelvic abscess just above the bladder. Approximately 300 cc of purulent fluid aspirated. Culture was sent. Catheter was attached to gravity drainage. Patient tolerated the procedure well and remained hemodynamically stable throughout. No complications were encountered and no significant blood loss. FINDINGS: Initial  CT demonstrates balloon retention urinary catheter. A left ureteral stent in position. Abscess within the pelvis just superior to the urinary bladder with enteric contrast in the dependent aspect. Air in the anti dependent aspect. Very small window for placement of the drain at the most cephalad aspect of the abscess. Twelve French drain was placed with the abscess decompressed. IMPRESSION: Status post CT-guided drainage of pelvic abscess. Signed, Dulcy Fanny. Dellia Nims, RPVI [facility] Electronically Signed   By: Corrie Mckusick D.O.   On: 11/16/2019 07:35    Labs: BNP (last 3 results) No results for input(s): BNP in the last 8760 hours. Basic Metabolic Panel: Recent Labs  Lab 11/13/19 0322 11/13/19 0322 11/14/19 0333 11/14/19 0333 11/15/19 0341 11/17/19 1109 11/18/19 0314 11/19/19 0308 11/19/19 1030  NA 135   < > 143   < > 135 142 140 141 140  K 3.4*   < > 4.4   < > 4.7 4.8 4.8 5.3* 5.0  CL 109   < > 114*   < > 108 115* 110 113* 112*  CO2 18*   < > 22   < > 20* 20* 19* 22 22  GLUCOSE 122*   < > 90   < > 89 93 84 84 90  BUN 50*   < > 46*   < > 43* 43* 40* 36* 38*  CREATININE 2.57*   < > 2.52*   < > 2.53* 2.66* 2.64* 2.56* 2.41*  CALCIUM 6.8*   < > 7.4*   < > 7.3* 7.5* 7.6* 7.8* 7.6*  MG 2.1  --  2.0  --  1.9  --   --   --   --    < > = values in this interval not displayed.   Liver Function Tests: No results for input(s): AST, ALT, ALKPHOS, BILITOT, PROT, ALBUMIN in the last 168 hours. No results for input(s): LIPASE, AMYLASE in the last 168 hours. No results for input(s): AMMONIA in the last 168 hours. CBC: Recent Labs  Lab 11/15/19 0341  11/16/19 0337 11/17/19 1109 11/18/19 0314 11/19/19 0308  WBC 8.1 7.9 7.8 7.7 9.2  HGB 7.3* 7.7* 7.3* 6.9* 8.4*  HCT 23.9* 25.5* 24.1* 23.2* 28.2*  MCV 97.2 97.0 98.8 100.4* 97.9  PLT 126* 139* 151 177 195   Cardiac Enzymes: No results for input(s): CKTOTAL, CKMB, CKMBINDEX,  TROPONINI in the last 168 hours. BNP: Invalid input(s): POCBNP CBG: Recent Labs  Lab 11/15/19 1127 11/15/19 1845 11/15/19 2152 11/16/19 0758 11/16/19 1146  GLUCAP 77 80 158* 76 73   D-Dimer No results for input(s): DDIMER in the last 72 hours. Hgb A1c No results for input(s): HGBA1C in the last 72 hours. Lipid Profile No results for input(s): CHOL, HDL, LDLCALC, TRIG, CHOLHDL, LDLDIRECT in the last 72 hours. Thyroid function studies No results for input(s): TSH, T4TOTAL, T3FREE, THYROIDAB in the last 72 hours.  Invalid input(s): FREET3 Anemia work up No results for input(s): VITAMINB12, FOLATE, FERRITIN, TIBC, IRON, RETICCTPCT in the last 72 hours. Urinalysis    Component Value Date/Time   COLORURINE YELLOW 11/07/2019 1836   APPEARANCEUR CLOUDY (A) 11/07/2019 1836   LABSPEC 1.012 11/07/2019 1836   PHURINE 6.0 11/07/2019 1836   GLUCOSEU NEGATIVE 11/07/2019 1836   HGBUR MODERATE (A) 11/07/2019 1836   BILIRUBINUR NEGATIVE 11/07/2019 1836   KETONESUR NEGATIVE 11/07/2019 1836   PROTEINUR 30 (A) 11/07/2019 1836   NITRITE POSITIVE (A) 11/07/2019 1836   LEUKOCYTESUR LARGE (A) 11/07/2019 1836   Sepsis Labs Invalid input(s): PROCALCITONIN,  WBC,  LACTICIDVEN Microbiology Recent Results (from the past 240 hour(s))  Aerobic/Anaerobic Culture (surgical/deep wound)     Status: None (Preliminary result)   Collection Time: 11/15/19  6:09 PM   Specimen: Abdomen; Abscess  Result Value Ref Range Status   Specimen Description   Final    ABDOMEN ABSCESS Performed at [facility] [address] [place], [place] [address]    Special Requests   Final    NONE Performed at [facility], [place] [address], [place], [place] [address]    Gram Stain   Final    ABUNDANT WBC PRESENT, PREDOMINANTLY PMN FEW GRAM NEGATIVE RODS RARE GRAM POSITIVE COCCI IN PAIRS Performed at [facility], [place] [address] [place], [place] [address]    Culture   Final     MODERATE ESCHERICHIA COLI MODERATE PSEUDOMONAS AERUGINOSA NO ANAEROBES ISOLATED; CULTURE IN PROGRESS FOR 5 DAYS    Report Status PENDING  Incomplete   Organism ID, Bacteria ESCHERICHIA COLI  Final   Organism ID, Bacteria PSEUDOMONAS AERUGINOSA  Final      Susceptibility   Escherichia coli - MIC*    AMPICILLIN >=32 RESISTANT Resistant     CEFAZOLIN >=64 RESISTANT Resistant     CEFEPIME 0.5 SENSITIVE Sensitive     CEFTAZIDIME 16 INTERMEDIATE Intermediate     CEFTRIAXONE >=64 RESISTANT Resistant     CIPROFLOXACIN <=0.25 SENSITIVE Sensitive     GENTAMICIN <=1 SENSITIVE Sensitive     IMIPENEM <=0.25 SENSITIVE Sensitive     TRIMETH/SULFA <=20 SENSITIVE Sensitive     AMPICILLIN/SULBACTAM 16 INTERMEDIATE Intermediate     PIP/TAZO 8 SENSITIVE Sensitive     * MODERATE ESCHERICHIA COLI   Pseudomonas aeruginosa - MIC*    CEFTAZIDIME 4 SENSITIVE Sensitive     CIPROFLOXACIN <=0.25 SENSITIVE Sensitive     GENTAMICIN <=1 SENSITIVE Sensitive     IMIPENEM 1 SENSITIVE Sensitive     PIP/TAZO 8 SENSITIVE Sensitive     CEFEPIME 2 SENSITIVE Sensitive     *  MODERATE PSEUDOMONAS AERUGINOSA  SARS Coronavirus 2 by RT PCR (hospital order, performed in [facility] hospital lab) Nasopharyngeal Nasopharyngeal Swab     Status: None   Collection Time: 11/19/19 10:28 AM   Specimen: Nasopharyngeal Swab  Result Value Ref Range Status   SARS Coronavirus 2 NEGATIVE NEGATIVE Final    Comment: (NOTE) SARS-CoV-2 target nucleic acids are NOT DETECTED.  The SARS-CoV-2 RNA is generally detectable in upper and lower respiratory specimens during the acute phase of infection. The lowest concentration of SARS-CoV-2 viral copies this assay can detect is 250 copies / mL. A negative result does not preclude SARS-CoV-2 infection and should not be used as the sole basis for treatment or other patient management decisions.  A negative result may occur with improper specimen collection / handling, submission of specimen  other than nasopharyngeal swab, presence of viral mutation(s) within the areas targeted by this assay, and inadequate number of viral copies (<250 copies / mL). A negative result must be combined with clinical observations, patient history, and epidemiological information.  Fact Sheet for Patients:   StrictlyIdeas.no  Fact Sheet for Healthcare Providers: BankingDealers.co.za  This test is not yet approved or  cleared by the [place] FDA and has been authorized for detection and/or diagnosis of SARS-CoV-2 by FDA under an Emergency Use Authorization (EUA).  This EUA will remain in effect (meaning this test can be used) for the duration of the COVID-19 declaration under Section 564(b)(1) of the Act, 21 U.S.C. section 360bbb-3(b)(1), unless the authorization is terminated or revoked sooner.  Performed at [facility], [place] [address], [place], [place] [address]     Time coordinating discharge: 35 minutes  SIGNED: Antonieta Pert, MD  [facility] 11/19/2019, 1:35 PM  If 7PM-7AM, please contact night-coverage [email or web address]

## 2019-11-19 NOTE — Progress Notes (Signed)
Report given to [facility] at [facility]. Patient will be transferred via [place].

## 2019-11-19 NOTE — TOC Transition Note (Addendum)
Transition of Care [facility]) - CM/SW Discharge Note   Patient Details  Name: Chase [name] MRN: [id number] Date of Birth: January 16, 1966  [facility] of [facility] CM/SW Contact:  Trish Mage, LCSW Phone Number: 11/19/2019, 10:22 AM   Clinical Narrative:   Patient to transition back to [facility] today after results of negative COVID test. PTAR arranged. Nursing, please call report to [phone], room 7008. TOC sign off.    Final next level of care: Skilled Nursing Facility Barriers to Discharge: Barriers Resolved   Patient Goals and CMS Choice Patient states their goals for this hospitalization and ongoing recovery are:: to get better and go back to [employer].gov Compare Post Acute Care list provided to:: Patient Choice offered to / list presented to : Patient  Discharge Placement                       Discharge Plan and Services   Discharge Planning Services: CM Consult Post Acute Care Choice: [place]                               Social Determinants of Health (SDOH) Interventions     Readmission Risk Interventions No flowsheet data found.

## 2019-11-19 NOTE — Progress Notes (Addendum)
[facility] Progress Note  5 Days Post-Op  Subjective: CC-  States that his pain is mostly chronic. Still requiring some morphine. Denies feeling any worse. Tolerating diet although not eating a lot. He is drinking Boost Breeze this AM. BM yesterday. WBC WNL, afebrile. Planning to go to rehab once discharged.  Objective: Vital signs in last 24 hours: Temp:  [97.9 F (36.6 C)-98.6 F (37 C)] 97.9 F (36.6 C) (08/20 0528) Pulse Rate:  [59-88] 65 (08/20 0528) Resp:  [15-18] 16 (08/20 0528) BP: (107-120)/(61-80) 116/76 (08/20 0528) SpO2:  [97 %-100 %] 99 % (08/20 0528) Last BM Date: 11/18/19  Intake/Output from previous day: 08/19 0701 - 08/20 0700 In: 911.3 [I.V.:100; Blood:811.3] Out: 2570 [Urine:2570] Intake/Output this shift: No intake/output data recorded.  PE: Gen:  Alert, NAD, pleasant Pulm: rate and effort normal Abd: soft, mild diffuse tenderness, right IR nephrostomy tube with clear yellow urine, midline IR drain with light brown/purulent fluid GU: foley in place with clear yellow urine  Lab Results:  Recent Labs    11/18/19 0314 11/19/19 0308  WBC 7.7 9.2  HGB 6.9* 8.4*  HCT 23.2* 28.2*  PLT 177 195   BMET Recent Labs    11/18/19 0314 11/19/19 0308  NA 140 141  K 4.8 5.3*  CL 110 113*  CO2 19* 22  GLUCOSE 84 84  BUN 40* 36*  CREATININE 2.64* 2.56*  CALCIUM 7.6* 7.8*   PT/INR No results for input(s): LABPROT, INR in the last 72 hours. CMP     Component Value Date/Time   NA 141 11/19/2019 0308   K 5.3 (H) 11/19/2019 0308   CL 113 (H) 11/19/2019 0308   CO2 22 11/19/2019 0308   GLUCOSE 84 11/19/2019 0308   BUN 36 (H) 11/19/2019 0308   CREATININE 2.56 (H) 11/19/2019 0308   CALCIUM 7.8 (L) 11/19/2019 0308   PROT 5.6 (L) 11/10/2019 0932   ALBUMIN 1.8 (L) 11/10/2019 0932   AST 17 11/10/2019 0932   ALT 41 11/10/2019 0932   ALKPHOS 111 11/10/2019 0932   BILITOT 0.4 11/10/2019 0932   GFRNONAA 27 (L) 11/19/2019 0308   GFRAA 31 (L)  11/19/2019 0308   Lipase  No results found for: LIPASE     Studies/Results: No results found.  Anti-infectives: Anti-infectives (From admission, onward)   Start     Dose/Rate Route Frequency Ordered Stop   11/16/19 1315  ceFAZolin (ANCEF) IVPB 2g/100 mL premix        2 g 200 mL/hr over 30 Minutes Intravenous  Once 11/16/19 1302 11/16/19 1350   11/10/19 0830  ceFEPIme (MAXIPIME) 2 g in sodium chloride 0.9 % 100 mL IVPB        2 g 200 mL/hr over 30 Minutes Intravenous Every 24 hours 11/10/19 0815     11/08/19 2000  ceFEPIme (MAXIPIME) 2 g in sodium chloride 0.9 % 100 mL IVPB  Status:  Discontinued        2 g 200 mL/hr over 30 Minutes Intravenous Every 24 hours 11/08/19 0025 11/08/19 1037   11/08/19 2000  vancomycin (VANCOREADY) IVPB 500 mg/100 mL  Status:  Discontinued        500 mg 100 mL/hr over 60 Minutes Intravenous Every 24 hours 11/08/19 0027 11/08/19 1037   11/08/19 1800  cefTRIAXone (ROCEPHIN) 2 g in sodium chloride 0.9 % 100 mL IVPB  Status:  Discontinued        2 g 200 mL/hr over 30 Minutes Intravenous Every 24 hours 11/08/19 1037  11/10/19 0815   11/08/19 0300  ceFEPIme (MAXIPIME) 1 g in sodium chloride 0.9 % 100 mL IVPB  Status:  Discontinued        1 g 200 mL/hr over 30 Minutes Intravenous Every 12 hours 11/08/19 0021 11/08/19 0025   11/07/19 1930  vancomycin (VANCOREADY) IVPB 1250 mg/250 mL        1,250 mg 166.7 mL/hr over 90 Minutes Intravenous  Once 11/07/19 1858 11/07/19 2154   11/07/19 1900  ceFEPIme (MAXIPIME) 2 g in sodium chloride 0.9 % 100 mL IVPB        2 g 200 mL/hr over 30 Minutes Intravenous  Once 11/07/19 1854 11/07/19 2009   11/07/19 1900  metroNIDAZOLE (FLAGYL) IVPB 500 mg        500 mg 100 mL/hr over 60 Minutes Intravenous  Once 11/07/19 1854 11/07/19 2325   11/07/19 1900  vancomycin (VANCOCIN) IVPB 1000 mg/200 mL premix  Status:  Discontinued        1,000 mg 200 mL/hr over 60 Minutes Intravenous  Once 11/07/19 1854 11/07/19 1858        Assessment/Plan Sepsis Chronic indwelling catheter CKD 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 Raynelle Bring, POD #5 -S/P IR RIGHT PERC NEPHROSTOMY TUBE 11/16/19   Pelvic abscess - between posterior wall of the bladder and anterior wall of the recum with probable colovesical fistula -S/P IR drain placement pelvic abscess 11/15/19, 20cc/24h  - cx with E COLI and PSEUDOMONAS AERUGINOSA, report pending  Urinary retention-Foley in place, continue per urology  FEN: IV fluids/regular diet, Boost ID: Rocephin 8/9/-8/10; Maxipime 8/8 x 1,maxipime8/11>>day#9 DVT: SCDs/off heparin for hematuria Follow-up: IR drain clinic, GI with colonoscopy in about 6 weeks.  Subsequent follow-up with [name] for probable robotic colectomy/repair of colovesicular fistula  Plan: Will need to wean off morphine prior to discharge, use norco first and morphine for breakthrough pain. Continue drain. Recommend 14 total days of antibiotics, he is on day#9 of continuous IV maxipime, ID recommending Cipro when transitioned to oral. Once medically ready for discharge he will need follow up with IR drain clinic, colonoscopy in about 6 weeks followed by surgery. Follow up with [name] on AVS.   LOS: 11 days    Wellington Hampshire, [facility] 11/19/2019, 8:10 AM Please see Amion for pager number during day hours 7:00am-4:30pm

## 2019-11-19 NOTE — NC FL2 (Signed)
[place] LEVEL OF CARE SCREENING TOOL     IDENTIFICATION  Patient Name: Chase [name] Birthdate: 31-Aug-1965 Sex: male Admission Date (Current Location): 11/07/2019  [facility] and [place] Number:  Herbalist and Address:  [facility],  [place] [address], [place]      Provider Number: [id number]  Attending Physician Name and Address:  Antonieta Pert, MD  Relative Name and Phone Number:       Current Level of Care: Hospital Recommended Level of Care: [place] Prior Approval Number:    Date Approved/Denied:   PASRR Number: [id number]  Discharge Plan: SNF    Current Diagnoses: Patient Active Problem List   Diagnosis Date Noted  . 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 Pelvic abscess with colovescial fistula 11/15/2019  . E. coli UTI (urinary tract infection) 11/15/2019  . PTSD (post-traumatic stress disorder)   . Chemical exposure   . Cervical radiculopathy   . Septic shock ([place]) 11/08/2019  . Protein-calorie malnutrition, severe 10/23/2019  . Cerebral embolism with cerebral infarction 10/18/2019  . Acute left-sided weakness 10/17/2019  . CKD (chronic kidney disease), stage IV ([place]) 10/17/2019  . Chronic pain 10/17/2019  . Fall 10/17/2019  . Pseudomonas urinary tract infection 08/24/2019  . BPH (benign prostatic hyperplasia) 08/24/2019  . Acute on chronic renal failure ([place]) 08/24/2019  . Anemia 08/24/2019  . Dyspnea 08/24/2019  . History of COVID-19 08/24/2019  . History of elevated PSA 08/24/2019  . 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 Hyponatremia 08/24/2019  . Metabolic acidosis 25/07/3974  . Skin lesion of neck 08/24/2019  . Toxic metabolic encephalopathy 73/41/9379  . Cervical spondylosis without myelopathy 06/19/2016  . Neck pain 09/04/2015  . History of stomach ulcers 05/19/2013  . Male erectile dysfunction 05/19/2013  . Hodgkin lymphoma, unspecified, unspecified site ([place]) 12/10/2012  . Iron  deficiency anemia 12/10/2012  . Pernicious anemia 12/10/2012  . Arthralgia of multiple sites 04/20/2012  . Postlaminectomy syndrome, not elsewhere classified 04/20/2012    Orientation RESPIRATION BLADDER Height & Weight     Self, Time, Situation, Place    Indwelling catheter Weight:  (Bed does not allow for weight to be recorded.) Height:  5\' 8"  (172.7 cm)  BEHAVIORAL SYMPTOMS/MOOD NEUROLOGICAL BOWEL NUTRITION STATUS   (None)  (none) Continent Diet (see d/c summary)  AMBULATORY STATUS COMMUNICATION OF NEEDS Skin   Extensive Assist Verbally Other (Comment) (Pressur Injury Coccyx, Closed drain-abdomen)                       Personal Care Assistance Level of Assistance    Bathing Assistance: Maximum assistance Feeding assistance: Limited assistance Dressing Assistance: Maximum assistance     Functional Limitations Info  Sight, Hearing, Speech Sight Info: Adequate Hearing Info: Adequate Speech Info: Adequate    SPECIAL CARE FACTORS FREQUENCY        PT Frequency: 5X/W OT Frequency: 5X/W            Contractures Contractures Info: Not present    Additional Factors Info  Code Status, Allergies Code Status Info: full Allergies Info: Nsaids           Current Medications (11/19/2019):  This is the current hospital active medication list Current Facility-Administered Medications  Medication Dose Route Frequency Provider Last Rate Last Admin  . acetaminophen (TYLENOL) tablet 650 mg  650 mg Oral Q6H PRN Lang Snow, FNP      . ceFEPIme (MAXIPIME) 2 g in sodium chloride 0.9 %  100 mL IVPB  2 g Intravenous Q24H Raynelle Bring, MD 200 mL/hr at 11/19/19 0819 2 g at 11/19/19 0819  . Chlorhexidine Gluconate Cloth 2 % PADS 6 each  6 each Topical Daily Raynelle Bring, MD   6 each at 11/18/19 [phone]  . docusate sodium (COLACE) capsule 100 mg  100 mg Oral BID PRN Raynelle Bring, MD   100 mg at 11/17/19 2306  . DULoxetine (CYMBALTA) DR capsule 60 mg  60 mg Oral Daily Raynelle Bring, MD   60 mg at 11/19/19 1007  . feeding supplement (BOOST / RESOURCE BREEZE) liquid 1 Container  1 Container Oral QID Antonieta Pert, MD   1 Container at 11/19/19 0930  . ferrous sulfate tablet 325 mg  325 mg Oral Q breakfast Raynelle Bring, MD   325 mg at 11/18/19 0827  . gabapentin (NEURONTIN) capsule 300 mg  300 mg Oral BID Raynelle Bring, MD   300 mg at 11/19/19 1006  . HYDROcodone-acetaminophen (NORCO/VICODIN) 5-325 MG per tablet 1-2 tablet  1-2 tablet Oral Q4H PRN Meuth, Brooke A, PA-C      . hydrOXYzine (ATARAX/VISTARIL) tablet 10 mg  10 mg Oral TID PRN Raynelle Bring, MD   10 mg at 11/18/19 2112  . midodrine (PROAMATINE) tablet 5 mg  5 mg Oral TID Kandis Mannan, MD   5 mg at 11/19/19 1791  . morphine 2 MG/ML injection 2 mg  2 mg Intravenous Q4H PRN Meuth, Brooke A, PA-C      . multivitamin with minerals tablet 1 tablet  1 tablet Oral Daily Raynelle Bring, MD   1 tablet at 11/19/19 1006  . pantoprazole (PROTONIX) EC tablet 40 mg  40 mg Oral BID Raynelle Bring, MD   40 mg at 11/19/19 1006  . polyethylene glycol (MIRALAX / GLYCOLAX) packet 17 g  17 g Oral Daily PRN Raynelle Bring, MD   17 g at 11/17/19 1646  . senna-docusate (Senokot-S) tablet 2 tablet  2 tablet Oral QHS PRN Raynelle Bring, MD   2 tablet at 11/17/19 2306  . sodium chloride flush (NS) 0.9 % injection 5 mL  5 mL Intracatheter Q8H Corrie Mckusick, DO   5 mL at 11/19/19 0552  . tamsulosin (FLOMAX) capsule 0.4 mg  0.4 mg Oral Daily Raynelle Bring, MD   0.4 mg at 11/19/19 1007     Discharge Medications: Please see discharge summary for a list of discharge medications.  Relevant Imaging Results:  Relevant Lab Results:   Additional Information SS#: [id number]  [place], [place]

## 2019-11-21 LAB — AEROBIC/ANAEROBIC CULTURE W GRAM STAIN (SURGICAL/DEEP WOUND)

## 2019-11-29 ENCOUNTER — Other Ambulatory Visit: Payer: Self-pay | Admitting: Surgery

## 2019-11-29 DIAGNOSIS — K651 Peritoneal abscess: Secondary | ICD-10-CM

## 2019-11-30 ENCOUNTER — Other Ambulatory Visit: Payer: Self-pay | Admitting: Radiology

## 2019-12-01 ENCOUNTER — Other Ambulatory Visit: Payer: No Typology Code available for payment source

## 2019-12-01 ENCOUNTER — Inpatient Hospital Stay: Admission: RE | Admit: 2019-12-01 | Payer: No Typology Code available for payment source | Source: Ambulatory Visit

## 2019-12-02 ENCOUNTER — Inpatient Hospital Stay (HOSPITAL_COMMUNITY): Admit: 2019-12-02 | Payer: No Typology Code available for payment source

## 2019-12-02 ENCOUNTER — Ambulatory Visit (HOSPITAL_COMMUNITY): Payer: No Typology Code available for payment source

## 2019-12-03 ENCOUNTER — Other Ambulatory Visit: Payer: Self-pay | Admitting: Surgery

## 2019-12-03 ENCOUNTER — Ambulatory Visit (HOSPITAL_COMMUNITY)
Admission: RE | Admit: 2019-12-03 | Discharge: 2019-12-03 | Disposition: A | Discharge status: Home / Self Care | Payer: No Typology Code available for payment source | Source: Ambulatory Visit | Attending: Surgery | Admitting: Surgery

## 2019-12-03 ENCOUNTER — Other Ambulatory Visit: Payer: Self-pay

## 2019-12-03 DIAGNOSIS — K651 Peritoneal abscess: Secondary | ICD-10-CM

## 2019-12-03 DIAGNOSIS — Z4682 Encounter for fitting and adjustment of non-vascular catheter: Secondary | ICD-10-CM | POA: Insufficient documentation

## 2019-12-03 HISTORY — PX: IR SINUS/FIST TUBE CHK-NON GI: IMG673

## 2019-12-03 MED ORDER — IOHEXOL 300 MG/ML  SOLN: 50.0000 mL | Freq: Once | INTRAMUSCULAR | Status: DC | PRN

## 2019-12-07 ENCOUNTER — Other Ambulatory Visit: Payer: Self-pay | Admitting: Radiology

## 2019-12-08 ENCOUNTER — Other Ambulatory Visit: Payer: Self-pay

## 2019-12-08 ENCOUNTER — Ambulatory Visit (HOSPITAL_COMMUNITY)
Admission: RE | Admit: 2019-12-08 | Discharge: 2019-12-08 | Disposition: A | Discharge status: Home / Self Care | Payer: No Typology Code available for payment source | Source: Ambulatory Visit | Attending: Student | Admitting: Student

## 2019-12-08 ENCOUNTER — Encounter (HOSPITAL_COMMUNITY): Payer: Self-pay

## 2019-12-08 DIAGNOSIS — N1330 Unspecified hydronephrosis: Secondary | ICD-10-CM | POA: Diagnosis present

## 2019-12-08 HISTORY — PX: IR URETERAL STENT PLACEMENT EXISTING ACCESS RIGHT: IMG6074

## 2019-12-08 LAB — BASIC METABOLIC PANEL
Anion gap: 11 (ref 5–15)
BUN: 41 mg/dL — ABNORMAL HIGH (ref 6–20)
CO2: 14 mmol/L — ABNORMAL LOW (ref 22–32)
Calcium: 8.3 mg/dL — ABNORMAL LOW (ref 8.9–10.3)
Chloride: 112 mmol/L — ABNORMAL HIGH (ref 98–111)
Creatinine, Ser: 2.78 mg/dL — ABNORMAL HIGH (ref 0.61–1.24)
GFR calc Af Amer: 28 mL/min — ABNORMAL LOW (ref >60)
GFR calc non Af Amer: 24 mL/min — ABNORMAL LOW (ref >60)
Glucose, Bld: 94 mg/dL (ref 70–99)
Potassium: 4.7 mmol/L (ref 3.5–5.1)
Sodium: 137 mmol/L (ref 135–145)

## 2019-12-08 LAB — CBC WITH DIFFERENTIAL/PLATELET
Abs Immature Granulocytes: 0.02 10*3/uL (ref 0.00–0.07)
Basophils Absolute: 0.1 10*3/uL (ref 0.0–0.1)
Basophils Relative: 1 %
Eosinophils Absolute: 0.8 10*3/uL — ABNORMAL HIGH (ref 0.0–0.5)
Eosinophils Relative: 16 %
HCT: 29.5 % — ABNORMAL LOW (ref 39.0–52.0)
Hemoglobin: 8.9 g/dL — ABNORMAL LOW (ref 13.0–17.0)
Immature Granulocytes: 0 %
Lymphocytes Relative: 24 %
Lymphs Abs: 1.2 10*3/uL (ref 0.7–4.0)
MCH: 30.7 pg (ref 26.0–34.0)
MCHC: 30.2 g/dL (ref 30.0–36.0)
MCV: 101.7 fL — ABNORMAL HIGH (ref 80.0–100.0)
Monocytes Absolute: 0.5 10*3/uL (ref 0.1–1.0)
Monocytes Relative: 10 %
Neutro Abs: 2.3 10*3/uL (ref 1.7–7.7)
Neutrophils Relative %: 49 %
Platelets: 238 10*3/uL (ref 150–400)
RBC: 2.9 MIL/uL — ABNORMAL LOW (ref 4.22–5.81)
RDW: 20.7 % — ABNORMAL HIGH (ref 11.5–15.5)
WBC: 4.8 10*3/uL (ref 4.0–10.5)
nRBC: 0 % (ref 0.0–0.2)

## 2019-12-08 LAB — PROTIME-INR
INR: 1.1 (ref 0.8–1.2)
Prothrombin Time: 13.6 seconds (ref 11.4–15.2)

## 2019-12-08 MED ORDER — HYDROMORPHONE HCL 1 MG/ML IJ SOLN
INTRAMUSCULAR | Status: AC | PRN
  Administered 2019-12-08: 0.5 mg via INTRAVENOUS

## 2019-12-08 MED ORDER — MIDAZOLAM HCL 2 MG/2ML IJ SOLN
INTRAMUSCULAR | Status: AC
  Filled 2019-12-08: qty 4

## 2019-12-08 MED ORDER — LIDOCAINE HCL 1 % IJ SOLN
INTRAMUSCULAR | Status: AC
  Filled 2019-12-08: qty 20

## 2019-12-08 MED ORDER — CIPROFLOXACIN IN D5W 400 MG/200ML IV SOLN
INTRAVENOUS | Status: AC
  Administered 2019-12-08: 400 mg via INTRAVENOUS
  Filled 2019-12-08: qty 200

## 2019-12-08 MED ORDER — SODIUM CHLORIDE 0.9% FLUSH: 5.0000 mL | Freq: Three times a day (TID) | INTRAVENOUS | Status: DC

## 2019-12-08 MED ORDER — LIDOCAINE HCL (PF) 1 % IJ SOLN
INTRAMUSCULAR | Status: AC | PRN
  Administered 2019-12-08: 5 mL

## 2019-12-08 MED ORDER — IOHEXOL 300 MG/ML  SOLN
50.0000 mL | Freq: Once | INTRAMUSCULAR | Status: AC | PRN
  Administered 2019-12-08: 15 mL

## 2019-12-08 MED ORDER — MIDAZOLAM HCL 2 MG/2ML IJ SOLN
INTRAMUSCULAR | Status: AC | PRN
  Administered 2019-12-08: 1 mg via INTRAVENOUS

## 2019-12-08 MED ORDER — HYDROMORPHONE HCL 1 MG/ML IJ SOLN
INTRAMUSCULAR | Status: AC
  Filled 2019-12-08: qty 1

## 2019-12-08 MED ORDER — SODIUM CHLORIDE 0.9 % IV SOLN: INTRAVENOUS | Status: DC

## 2019-12-08 MED ORDER — CIPROFLOXACIN IN D5W 400 MG/200ML IV SOLN: 400.0000 mg | INTRAVENOUS | Status: AC

## 2019-12-08 NOTE — Discharge Instructions (Signed)
Ureteral Stent Implantation, Care After This sheet gives you information about how to care for yourself after your procedure. Your health care provider may also give you more specific instructions. If you have problems or questions, contact your health care provider. What can I expect after the procedure? After the procedure, it is common to have:  Nausea.  Mild pain when you urinate. You may feel this pain in your lower back or lower abdomen. The pain should stop within a few minutes after you urinate. This may last for up to 1 week.  A small amount of blood in your urine for several days. Follow these instructions at home: Medicines  Take over-the-counter and prescription medicines only as told by your health care provider.  If you were prescribed an antibiotic medicine, take it as told by your health care provider. Do not stop taking the antibiotic even if you start to feel better.  Do not drive for 24 hours if you were given a sedative during your procedure.  Ask your health care provider if the medicine prescribed to you requires you to avoid driving or using heavy machinery. Activity  Rest as told by your health care provider.  Avoid sitting for a long time without moving. Get up to take short walks every 1-2 hours. This is important to improve blood flow and breathing. Ask for help if you feel weak or unsteady.  Return to your normal activities as told by your health care provider. Ask your health care provider what activities are safe for you. General instructions   Watch for any blood in your urine. Call your health care provider if the amount of blood in your urine increases.  If you have a catheter: ? Follow instructions from your health care provider about taking care of your catheter and collection bag. ? Do not take baths, swim, or use a hot tub until your health care provider approves. Ask your health care provider if you may take showers. You may only be allowed to  take sponge baths.  Drink enough fluid to keep your urine pale yellow.  Do not use any products that contain nicotine or tobacco, such as cigarettes, e-cigarettes, and chewing tobacco. These can delay healing after surgery. If you need help quitting, ask your health care provider.  Keep all follow-up visits as told by your health care provider. This is important. Contact a health care provider if:  You have pain that gets worse or does not get better with medicine, especially pain when you urinate.  You have difficulty urinating.  You feel nauseous or you vomit repeatedly during a period of more than 2 days after the procedure. Get help right away if:  Your urine is dark red or has blood clots in it.  You are leaking urine (have incontinence).  The end of the stent comes out of your urethra.  You cannot urinate.  You have sudden, sharp, or severe pain in your abdomen or lower back.  You have a fever.  You have swelling or pain in your legs.  You have difficulty breathing. Summary  After the procedure, it is common to have mild pain when you urinate that goes away within a few minutes after you urinate. This may last for up to 1 week.  Watch for any blood in your urine. Call your health care provider if the amount of blood in your urine increases.  Take over-the-counter and prescription medicines only as told by your health care provider.  Drink   enough fluid to keep your urine pale yellow. This information is not intended to replace advice given to you by your health care provider. Make sure you discuss any questions you have with your health care provider. Document Revised: 12/23/2017 Document Reviewed: 12/24/2017 Elsevier Patient Education  [place].    Moderate Conscious Sedation, Adult, Care After These instructions provide you with information about caring for yourself after your procedure. Your health care provider may also give you more specific  instructions. Your treatment has been planned according to current medical practices, but problems sometimes occur. Call your health care provider if you have any problems or questions after your procedure. What can I expect after the procedure? After your procedure, it is common:  To feel sleepy for several hours.  To feel clumsy and have poor balance for several hours.  To have poor judgment for several hours.  To vomit if you eat too soon. Follow these instructions at home: For at least 24 hours after the procedure:   Do not: ? Participate in activities where you could fall or become injured. ? Drive. ? Use heavy machinery. ? Drink alcohol. ? Take sleeping pills or medicines that cause drowsiness. ? Make important decisions or sign legal documents. ? Take care of children on your own.  Rest. Eating and drinking  Follow the diet recommended by your health care provider.  If you vomit: ? Drink water, juice, or soup when you can drink without vomiting. ? Make sure you have little or no nausea before eating solid foods. General instructions  Have a responsible adult stay with you until you are awake and alert.  Take over-the-counter and prescription medicines only as told by your health care provider.  If you smoke, do not smoke without supervision.  Keep all follow-up visits as told by your health care provider. This is important. Contact a health care provider if:  You keep feeling nauseous or you keep vomiting.  You feel light-headed.  You develop a rash.  You have a fever. Get help right away if:  You have trouble breathing. This information is not intended to replace advice given to you by your health care provider. Make sure you discuss any questions you have with your health care provider. Document Revised: 02/28/2017 Document Reviewed: 07/08/2015 Elsevier Patient Education  2020 [employer].

## 2019-12-08 NOTE — Procedures (Signed)
  Procedure: R perc neph conversion to 24cm JJ ureteral stent   EBL:   minimal Complications:  none immediate  See full dictation in [employer].  Dillard Cannon MD Main # [phone] Pager  [phone]

## 2019-12-08 NOTE — H&P (Signed)
Referring Physician(s): Borden,L  Supervising Physician: Arne [place]  Patient Status:  [facility] OP  Chief Complaint:  "I'm getting a stent put in my kidney"  Subjective: Patient familiar to IR service from pelvic abscess drain placement on 11/16/19 along with right percutaneous nephrostomy.  Pelvic abscess drain was removed on 12/03/2019 with no evidence of fistula.  He has a history of Hodgkin's lymphoma and BPH.  Prior cystoscopy at [facility] identified a colovesical fistula with bilateral ureteral obstruction /hydronephrosis and a double-J stent was placed, however the right ureter was too tortuous for placement of a retrograde stent.  The right nephrostomy and pelvic abscess drain was subsequently placed.  He presents again today for right nephrostogram with attempted ureteral stent placement.  He denies fever, headache, chest pain, dyspnea, cough, back pain, nausea, vomiting.  He does have some discomfort at previous mid pelvic drain site and has some blood-tinged urine in his right nephrostomy bag.  He has left-sided weakness from prior CVA.  Additional history as below.  Past Medical History:  Diagnosis Date  . Cervical radiculopathy    with left sided weakness  . Chemical exposure    service related injury  . Chronic pain   . GIB (gastrointestinal bleeding)   . PTSD (post-traumatic stress disorder)   . Renal disorder    CKD  . Urinary retention    due to bladder outlet obstruction   Past Surgical History:  Procedure Laterality Date  . CYSTOSCOPY W/ URETERAL STENT PLACEMENT N/A 11/14/2019   Procedure: CYSTOSCOPY WITH RETROGRADE PYELOGRAM bilateral Wyvonnia Dusky STENT PLACEMENT left fulguration bladder . cystogram;  Surgeon: Raynelle Bring, MD;  Location: [facility] ORS;  Service: Urology;  Laterality: N/A;  . GASTRIC BYPASS    . HERNIA REPAIR    . IR NEPHROSTOMY PLACEMENT RIGHT  11/16/2019  . IR SINUS/FIST TUBE CHK-NON GI  12/03/2019      Allergies: Nsaids  Medications: Prior to  Admission medications   Medication Sig Start Date End Date Taking? Authorizing Provider  cyanocobalamin (,VITAMIN B-12,) 1000 MCG/ML injection Inject 1,000 mcg into the muscle every 30 (thirty) days.  12/28/13  Yes [provider]  docusate sodium (COLACE) 100 MG capsule Take 1 capsule (100 mg total) by mouth 2 (two) times daily as needed for mild constipation. 11/19/19  Yes Antonieta Pert, MD  DULoxetine (CYMBALTA) 30 MG capsule Take 30-60 mg by mouth See admin instructions. Take 2 capsules in the morning and 1 capsule in the evening 08/12/17  Yes [provider]  feeding supplement, ENSURE ENLIVE, (ENSURE ENLIVE) LIQD Take 237 mLs by mouth 3 (three) times daily between meals. 10/26/19  Yes Mikhail, [place], DO  ferrous sulfate 325 (65 FE) MG tablet Take 325 mg by mouth daily.   Yes [provider]  gabapentin (NEURONTIN) 300 MG capsule Take 1 capsule (300 mg total) by mouth 2 (two) times daily. 10/26/19  Yes Mikhail, [place], DO  HYDROcodone-acetaminophen (NORCO/VICODIN) 5-325 MG tablet Take 1-2 tablets by mouth every 4 (four) hours as needed for up to 10 doses for moderate pain or severe pain (1 moderate, 2 severe). 11/19/19  Yes Antonieta Pert, MD  hydrOXYzine (ATARAX/VISTARIL) 10 MG tablet Take 1 tablet (10 mg total) by mouth 3 (three) times daily as needed for itching or anxiety. 10/26/19  Yes Mikhail, Maryann, DO  midodrine (PROAMATINE) 2.5 MG tablet Take 2 tablets (5 mg total) by mouth 3 (three) times daily with meals. 11/19/19  Yes Antonieta Pert, MD  Multiple Vitamin (MULTIVITAMIN WITH MINERALS)  TABS tablet Take 1 tablet by mouth daily. 10/27/19  Yes Mikhail, Maryann, DO  pantoprazole (PROTONIX) 40 MG tablet Take 40 mg by mouth 2 (two) times daily.  06/09/18  Yes [provider]  promethazine (PHENERGAN) 25 MG tablet Take 25 mg by mouth daily as needed for nausea or vomiting.   Yes [provider]  tamsulosin (FLOMAX) 0.4 MG CAPS capsule Take 0.4 mg by mouth daily.     Yes [provider]  aspirin EC 81 MG EC tablet Take 1 tablet (81 mg total) by mouth daily. Swallow whole. Patient not taking: Reported on 11/07/2019 10/27/19   Cristal Ford, DO     Vital Signs: BP 122/73   Pulse (!) 49 Comment: RN notified  Temp 98.1 F (36.7 C) (Oral)   Resp 18   SpO2 100%   Physical Exam awake, alert.  Chest clear to auscultation bilaterally anteriorly.  Heart with slightly bradycardic but regular rhythm.  Abdomen soft, positive bowel sounds, mildly tender mid anterior pelvic region; right nephrostomy intact draining blood-tinged urine.  No lower extremity edema, chronic left-sided weakness noted  Imaging: No results found.  Labs:  CBC: Recent Labs    11/16/19 0337 11/17/19 1109 11/18/19 0314 11/19/19 0308  WBC 7.9 7.8 7.7 9.2  HGB 7.7* 7.3* 6.9* 8.4*  HCT 25.5* 24.1* 23.2* 28.2*  PLT 139* 151 177 195    COAGS: Recent Labs    10/17/19 1241 10/17/19 1311 11/07/19 1836 11/07/19 1853 11/10/19 1052  INR 1.5* 1.6* 1.2  --  1.3*  APTT 43* 46*  --  31  --     BMP: Recent Labs    11/17/19 1109 11/18/19 0314 11/19/19 0308 11/19/19 1030  NA 142 140 141 140  K 4.8 4.8 5.3* 5.0  CL 115* 110 113* 112*  CO2 20* 19* 22 22  GLUCOSE 93 84 84 90  BUN 43* 40* 36* 38*  CALCIUM 7.5* 7.6* 7.8* 7.6*  CREATININE 2.66* 2.64* 2.56* 2.41*  GFRNONAA 26* 26* 27* 29*  GFRAA 30* 30* 31* 34*    LIVER FUNCTION TESTS: Recent Labs    10/19/19 0336 10/20/19 0618 11/07/19 1836 11/10/19 0932  BILITOT 0.7 0.5 0.6 0.4  AST 24 26 36 17  ALT 19 19 53* 41  ALKPHOS 86 94 180* 111  PROT 4.6* 5.0* 7.5 5.6*  ALBUMIN 1.6* 1.6* 2.7* 1.8*    Assessment and Plan: Pt with history of Hodgkin's lymphoma and BPH.  Prior cystoscopy at [facility] identified a colovesical fistula with bilateral ureteral obstruction /hydronephrosis and a double-J stent was placed, however the right ureter was too tortuous for placement of a retrograde stent; s/p right nephrostomy  and pelvic abscess drain placed on 11/16/19; status post drain injection on 12/03/2019 with no evidence of fistula and subsequent removal of drainage catheter; he presents again today for right template ureteral stent placement attempted ureteral stent placement.  Details/risks of procedure, including but not limited to, internal bleeding, infection, inability to place stent discussed with patient his understanding and consent. Latest creat 2.78(2.41)   Electronically Signed: D. Rowe Robert, PA-C 12/08/2019, 1:05 PM   I spent a total of 20 minutes at the the patient's bedside AND on the patient's hospital floor or unit, greater than 50% of which was counseling/coordinating care for right nephrostogram with attempted ureteral stent placement

## 2019-12-09 ENCOUNTER — Ambulatory Visit (INDEPENDENT_AMBULATORY_CARE_PROVIDER_SITE_OTHER): Payer: No Typology Code available for payment source | Admitting: Neurology

## 2019-12-09 ENCOUNTER — Encounter: Payer: Self-pay | Admitting: Neurology

## 2019-12-09 VITALS — BP 101/63 | HR 53 | Ht 63.0 in | Wt 120.0 lb

## 2019-12-09 DIAGNOSIS — R260 Ataxic gait: Secondary | ICD-10-CM

## 2019-12-09 DIAGNOSIS — I69354 Hemiplegia and hemiparesis following cerebral infarction affecting left non-dominant side: Secondary | ICD-10-CM | POA: Diagnosis not present

## 2019-12-09 DIAGNOSIS — I639 Cerebral infarction, unspecified: Secondary | ICD-10-CM | POA: Diagnosis not present

## 2019-12-09 NOTE — Progress Notes (Signed)
[facility] [address] [place]. [place] [address] [phone]       OFFICE FOLLOW-UP NOTE  [name] Date of Birth:  12-27-1965 Medical Record Number:  [id number]   HPI: Mr. Filip is a 54 year old Caucasian male seen today for initial office follow-up visit following hospital consultation for stroke in July 2021.  History is obtained from the patient, review of electronic medical records and I personally reviewed imaging films in PACS.  Mr. Dhaliwal 54 year old Caucasian male with past medical history of chronic pain, PTSD, chronic kidney disease and cervical radiculopathy who admitted to [facility] with left-sided weakness.  When he woke up he noticed his left leg gave out under him he also had some slurred speech.  Code stroke was called and CT head was negative for acute infarct CT angiogram was negative for LVO.  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  2D echo showed normal ejection fraction of 60 to 65% without cardiac source of embolism.  LDL cholesterol 26 mg percent hemoglobin A1c of 5.4.  The patient had history of anemia, thrombocytopenia and recent history of GI bleed hence it was felt is not a good long-term anticoagulation candidate hence TEE and prolonged cardiac monitoring was not recommended.  Patient also was felt to be malnourished and very frail health status.  He had been in and out of hospitals for the past several years with bladder issues.  He had bilateral hydronephrosis and was followed by urology with recurrent UTIs stage IV chronic kidney disease with metabolic acidosis.  Patient started on aspirin and subsequently stroke team was recalled to see him for subjective complaints of left eyelid heaviness on 10/24/2019 and on exam he was found to clearly open his eye and had some subjective  giveaway weakness and no further evaluation was done.  He subsequently been transferred to rehab at 19 but was admitted again in August with urosepsis with septic shock with colovesical fistula with large pelvic abscesses status post pelvic drain placement and right nephrostomy tube placement and ureteral stents.  Patient states she is making steady progress with physical therapy is able to walk with a walker but needs a 1 person assist.  He has not had any recurrent stroke or TIA symptoms.  His blood pressure is well controlled today it is 101/66.  He had his nephrostomy tube removed yesterday as well as bladder catheter.  He has bilateral ureteral stents and indwelling Foley catheter.  He has no new complaints today   ROS:   14 system review of systems is positive for weakness, ataxia, difficulty walking, sepsis, urine retention, and all other systems negative  PMH:  Past Medical History:  Diagnosis Date  . Cervical radiculopathy    with left sided weakness  . Chemical exposure    service related injury  . Chronic pain   . GIB (gastrointestinal bleeding)   . PTSD (post-traumatic stress disorder)   . Renal disorder    CKD  . Urinary retention    due to bladder outlet obstruction    Social History:  Social History   Socioeconomic History  . Marital status: Widowed    Spouse name: Not on file  . Number of children: Not on file  . Years of education: Not on file  . Highest education level: Not on file  Occupational History  . Not  on file  Tobacco Use  . Smoking status: Never Smoker  . Smokeless tobacco: Current User  Vaping Use  . Vaping Use: Never used  Substance and Sexual Activity  . Alcohol use: Never  . Drug use: Never  . Sexual activity: Not on file  Other Topics Concern  . Not on file  Social History Narrative  . Not on file   Social Determinants of Health   Financial Resource Strain:   . Difficulty of Paying Living Expenses: Not on file  Food Insecurity:   .  Worried About Charity fundraiser in the Last Year: Not on file  . Ran Out of Food in the Last Year: Not on file  Transportation Needs:   . Lack of Transportation (Medical): Not on file  . Lack of Transportation (Non-Medical): Not on file  Physical Activity:   . Days of Exercise per Week: Not on file  . Minutes of Exercise per Session: Not on file  Stress:   . Feeling of Stress : Not on file  Social Connections:   . Frequency of Communication with Friends and Family: Not on file  . Frequency of Social Gatherings with Friends and Family: Not on file  . Attends Religious Services: Not on file  . Active Member of Clubs or Organizations: Not on file  . Attends Archivist Meetings: Not on file  . Marital Status: Not on file  Intimate Partner Violence:   . Fear of Current or Ex-Partner: Not on file  . Emotionally Abused: Not on file  . Physically Abused: Not on file  . Sexually Abused: Not on file    Medications:   Current Outpatient Medications on File Prior to Visit  Medication Sig Dispense Refill  . aspirin EC 81 MG EC tablet Take 1 tablet (81 mg total) by mouth daily. Swallow whole.    . cyanocobalamin (,VITAMIN B-12,) 1000 MCG/ML injection Inject 1,000 mcg into the muscle every 30 (thirty) days.     Marland Kitchen docusate sodium (COLACE) 100 MG capsule Take 1 capsule (100 mg total) by mouth 2 (two) times daily as needed for mild constipation. 10 capsule 0  . DULoxetine (CYMBALTA) 30 MG capsule Take 30-60 mg by mouth See admin instructions. Take 2 capsules in the morning and 1 capsule in the evening    . feeding supplement, ENSURE ENLIVE, (ENSURE ENLIVE) LIQD Take 237 mLs by mouth 3 (three) times daily between meals. 237 mL 12  . gabapentin (NEURONTIN) 300 MG capsule Take 1 capsule (300 mg total) by mouth 2 (two) times daily.    Marland Kitchen HYDROcodone-acetaminophen (NORCO/VICODIN) 5-325 MG tablet Take 1-2 tablets by mouth every 4 (four) hours as needed for up to 10 doses for moderate pain or  severe pain (1 moderate, 2 severe). 10 tablet 0  . hydrOXYzine (ATARAX/VISTARIL) 10 MG tablet Take 1 tablet (10 mg total) by mouth 3 (three) times daily as needed for itching or anxiety. 30 tablet 0  . midodrine (PROAMATINE) 2.5 MG tablet Take 2 tablets (5 mg total) by mouth 3 (three) times daily with meals.    . Multiple Vitamin (MULTIVITAMIN WITH MINERALS) TABS tablet Take 1 tablet by mouth daily.    . pantoprazole (PROTONIX) 40 MG tablet Take 40 mg by mouth 2 (two) times daily.     . tamsulosin (FLOMAX) 0.4 MG CAPS capsule Take 0.4 mg by mouth daily.      No current facility-administered medications on file prior to visit.    Allergies:  Allergies  Allergen Reactions  . Nsaids Other (See Comments)    Bleeding GI Ulceration    Physical Exam General: Frail cachectic malnourished looking middle-aged Caucasian male seated, in no evident distress Head: head normocephalic and atraumatic.  Neck: supple with no carotid or supraclavicular bruits Cardiovascular: regular rate and rhythm, no murmurs Musculoskeletal: no deformity Skin:  no rash/petichiae Vascular:  Normal pulses all extremities Vitals:   12/09/19 1005  BP: 101/63  Pulse: (!) 53   Neurologic Exam Mental Status: Awake and fully alert. Oriented to place and time. Recent and remote memory intact. Attention span, concentration and fund of knowledge appropriate. Mood and affect appropriate.  Cranial Nerves: Fundoscopic exam reveals sharp disc margins. Pupils equal, briskly reactive to light. Extraocular movements full without nystagmus. Visual fields full to confrontation. Hearing diminished bilaterally. Facial sensation intact. Face, tongue, palate moves normally and symmetrically.  Motor: Diminished bulk throughout.  Left hemiparesis with left upper extremity 4/5 strength with weakness of left grip intrinsic hand muscles and orbits right over left upper extremity.  Diminished fine finger movements on left.  Left lower extremity  strength is 2/5 with left ankle dorsiflexor weakness and foot drop.. Sensory.:  Diminished left hemibody touch ,pinprick .position and vibratory sensation.  Coordination: Rapid alternating movements normal in right extremities.  And diminished on the left.   Gait and Station: Not tested as patient did not bring walker and is a 1 person assist even with a walker. Reflexes: 2+ and asymmetric and brisker on the left. Toes downgoing.   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  Patient is showing slow but steady improvement with therapy at rehab facility.  He is not a good long-term anticoagulation candidate given history of thrombocytopenia, recent GI bleed and fall risk hence would not pursue further work-up for paroxysmal A. fib or TEE.  No significant vascular risk factors.  Recent prolonged hospitalization for urosepsis with septic shock with colovesical fistula with large pelvic abscesses status post pelvic drain placement and right nephrostomy tube placement and ureteral stents    PLAN: I had a long d/w patient about his recent cryptogenic strokes,left hemiparesis and gait ataxia, risk for recurrent stroke/TIAs, personally independently reviewed imaging studies and stroke evaluation results and answered questions.Continue aspirin 81 mg daily  for secondary stroke prevention  and will not pursue more aggressive evaluation for source of embolism as he is not a good long-term anticoagulation candidate given his thrombocytopenia, history of GI bleed and fall risk.  Maintain strict control of hypertension with blood pressure goal below 130/90, diabetes with hemoglobin A1c goal below 6.5% and lipids with LDL cholesterol goal below 70 mg/dL. I also advised the patient to eat a healthy diet with plenty of whole grains, cereals, fruits and vegetables, exercise regularly and maintain ideal body weight.  He was  advised to continue to participate in ongoing physical and occupational therapy and to use a walker at all times and follow fall prevention precautions.  Followup in the future with my nurse practitioner Janett Billow in 6 months or call earlier if necessary. Greater than 50% of time during this 25 minute visit was spent on counseling,explanation of diagnosis of embolic strokes, planning of further management, discussion with patient and family and coordination of care Antony Contras, MD Note: This document was prepared with digital dictation and possible smart phrase technology. Any transcriptional errors that result from this process are unintentional

## 2019-12-09 NOTE — Patient Instructions (Signed)
I had a long d/w patient about his recent cryptogenic strokes,left hemiparesis and gait ataxia, risk for recurrent stroke/TIAs, personally independently reviewed imaging studies and stroke evaluation results and answered questions.Continue aspirin 81 mg daily  for secondary stroke prevention  and will not pursue more aggressive evaluation for source of embolism as he is not a good long-term anticoagulation candidate given his thrombocytopenia, history of GI bleed and fall risk.  Maintain strict control of hypertension with blood pressure goal below 130/90, diabetes with hemoglobin A1c goal below 6.5% and lipids with LDL cholesterol goal below 70 mg/dL. I also advised the patient to eat a healthy diet with plenty of whole grains, cereals, fruits and vegetables, exercise regularly and maintain ideal body weight.  He was advised to continue to participate in ongoing physical and occupational therapy and to use a walker at all times and follow fall prevention precautions.  Followup in the future with my nurse practitioner Janett Billow in 6 months or call earlier if necessary.  Stroke Prevention Some medical conditions and behaviors are associated with a higher chance of having a stroke. You can help prevent a stroke by making nutrition, lifestyle, and other changes, including managing any medical conditions you may have. What nutrition changes can be made?   Eat healthy foods. You can do this by: ? Choosing foods high in fiber, such as fresh fruits and vegetables and whole grains. ? Eating at least 5 or more servings of fruits and vegetables a day. Try to fill half of your plate at each meal with fruits and vegetables. ? Choosing lean protein foods, such as lean cuts of meat, poultry without skin, fish, tofu, beans, and nuts. ? Eating low-fat dairy products. ? Avoiding foods that are high in salt (sodium). This can help lower blood pressure. ? Avoiding foods that have saturated fat, trans fat, and cholesterol.  This can help prevent high cholesterol. ? Avoiding processed and premade foods.  Follow your health care provider's specific guidelines for losing weight, controlling high blood pressure (hypertension), lowering high cholesterol, and managing diabetes. These may include: ? Reducing your daily calorie intake. ? Limiting your daily sodium intake to 1,500 milligrams (mg). ? Using only healthy fats for cooking, such as olive oil, canola oil, or sunflower oil. ? Counting your daily carbohydrate intake. What lifestyle changes can be made?  Maintain a healthy weight. Talk to your health care provider about your ideal weight.  Get at least 30 minutes of moderate physical activity at least 5 days a week. Moderate activity includes brisk walking, biking, and swimming.  Do not use any products that contain nicotine or tobacco, such as cigarettes and e-cigarettes. If you need help quitting, ask your health care provider. It may also be helpful to avoid exposure to secondhand smoke.  Limit alcohol intake to no more than 1 drink a day for nonpregnant women and 2 drinks a day for men. One drink equals 12 oz of beer, 5 oz of wine, or 1 oz of hard liquor.  Stop any illegal drug use.  Avoid taking birth control pills. Talk to your health care provider about the risks of taking birth control pills if: ? You are over 49 years old. ? You smoke. ? You get migraines. ? You have ever had a blood clot. What other changes can be made?  Manage your cholesterol levels. ? Eating a healthy diet is important for preventing high cholesterol. If cholesterol cannot be managed through diet alone, you may also need to take  medicines. ? Take any prescribed medicines to control your cholesterol as told by your health care provider.  Manage your diabetes. ? Eating a healthy diet and exercising regularly are important parts of managing your blood sugar. If your blood sugar cannot be managed through diet and exercise, you  may need to take medicines. ? Take any prescribed medicines to control your diabetes as told by your health care provider.  Control your hypertension. ? To reduce your risk of stroke, try to keep your blood pressure below 130/80. ? Eating a healthy diet and exercising regularly are an important part of controlling your blood pressure. If your blood pressure cannot be managed through diet and exercise, you may need to take medicines. ? Take any prescribed medicines to control hypertension as told by your health care provider. ? Ask your health care provider if you should monitor your blood pressure at home. ? Have your blood pressure checked every year, even if your blood pressure is normal. Blood pressure increases with age and some medical conditions.  Get evaluated for sleep disorders (sleep apnea). Talk to your health care provider about getting a sleep evaluation if you snore a lot or have excessive sleepiness.  Take over-the-counter and prescription medicines only as told by your health care provider. Aspirin or blood thinners (antiplatelets or anticoagulants) may be recommended to reduce your risk of forming blood clots that can lead to stroke.  Make sure that any other medical conditions you have, such as atrial fibrillation or atherosclerosis, are managed. What are the warning signs of a stroke? The warning signs of a stroke can be easily remembered as BEFAST.  B is for balance. Signs include: ? Dizziness. ? Loss of balance or coordination. ? Sudden trouble walking.  E is for eyes. Signs include: ? A sudden change in vision. ? Trouble seeing.  F is for face. Signs include: ? Sudden weakness or numbness of the face. ? The face or eyelid drooping to one side.  A is for arms. Signs include: ? Sudden weakness or numbness of the arm, usually on one side of the body.  S is for speech. Signs include: ? Trouble speaking (aphasia). ? Trouble understanding.  T is for  time. ? These symptoms may represent a serious problem that is an emergency. Do not wait to see if the symptoms will go away. Get medical help right away. Call your local emergency services (911 in the [place] Do not drive yourself to the hospital.  Other signs of stroke may include: ? A sudden, severe headache with no known cause. ? Nausea or vomiting. ? Seizure. Where to find more information For more information, visit:  American Stroke Association: www.strokeassociation.org  National Stroke Association: www.stroke.org Summary  You can prevent a stroke by eating healthy, exercising, not smoking, limiting alcohol intake, and managing any medical conditions you may have.  Do not use any products that contain nicotine or tobacco, such as cigarettes and e-cigarettes. If you need help quitting, ask your health care provider. It may also be helpful to avoid exposure to secondhand smoke.  Remember BEFAST for warning signs of stroke. Get help right away if you or a loved one has any of these signs. This information is not intended to replace advice given to you by your health care provider. Make sure you discuss any questions you have with your health care provider. Document Revised: 02/28/2017 Document Reviewed: 04/23/2016 Elsevier Patient Education  2020 [employer].

## 2019-12-24 ENCOUNTER — Encounter (HOSPITAL_COMMUNITY): Payer: Self-pay

## 2019-12-24 ENCOUNTER — Other Ambulatory Visit: Payer: Self-pay

## 2019-12-24 ENCOUNTER — Inpatient Hospital Stay (HOSPITAL_COMMUNITY)
Admission: EM | Admit: 2019-12-24 | Discharge: 2020-01-08 | DRG: 659 | Disposition: A | Discharge status: Skilled Nursing Facility | Payer: No Typology Code available for payment source | Attending: Family Medicine | Admitting: Family Medicine

## 2019-12-24 ENCOUNTER — Emergency Department (HOSPITAL_COMMUNITY): Payer: No Typology Code available for payment source

## 2019-12-24 DIAGNOSIS — D509 Iron deficiency anemia, unspecified: Secondary | ICD-10-CM

## 2019-12-24 DIAGNOSIS — N321 Vesicointestinal fistula: Secondary | ICD-10-CM | POA: Diagnosis present

## 2019-12-24 DIAGNOSIS — D62 Acute posthemorrhagic anemia: Secondary | ICD-10-CM

## 2019-12-24 DIAGNOSIS — Z79899 Other long term (current) drug therapy: Secondary | ICD-10-CM | POA: Diagnosis not present

## 2019-12-24 DIAGNOSIS — N184 Chronic kidney disease, stage 4 (severe): Secondary | ICD-10-CM | POA: Diagnosis not present

## 2019-12-24 DIAGNOSIS — F329 Major depressive disorder, single episode, unspecified: Secondary | ICD-10-CM | POA: Diagnosis present

## 2019-12-24 DIAGNOSIS — N135 Crossing vessel and stricture of ureter without hydronephrosis: Secondary | ICD-10-CM | POA: Diagnosis not present

## 2019-12-24 DIAGNOSIS — N136 Pyonephrosis: Secondary | ICD-10-CM | POA: Diagnosis present

## 2019-12-24 DIAGNOSIS — Z6824 Body mass index (BMI) 24.0-24.9, adult: Secondary | ICD-10-CM | POA: Diagnosis not present

## 2019-12-24 DIAGNOSIS — N320 Bladder-neck obstruction: Secondary | ICD-10-CM | POA: Diagnosis present

## 2019-12-24 DIAGNOSIS — E8729 Other acidosis: Secondary | ICD-10-CM

## 2019-12-24 DIAGNOSIS — K432 Incisional hernia without obstruction or gangrene: Secondary | ICD-10-CM

## 2019-12-24 DIAGNOSIS — Z77098 Contact with and (suspected) exposure to other hazardous, chiefly nonmedicinal, chemicals: Secondary | ICD-10-CM | POA: Diagnosis present

## 2019-12-24 DIAGNOSIS — E43 Unspecified severe protein-calorie malnutrition: Secondary | ICD-10-CM | POA: Diagnosis present

## 2019-12-24 DIAGNOSIS — K651 Peritoneal abscess: Secondary | ICD-10-CM | POA: Diagnosis not present

## 2019-12-24 DIAGNOSIS — D649 Anemia, unspecified: Secondary | ICD-10-CM | POA: Diagnosis present

## 2019-12-24 DIAGNOSIS — B3749 Other urogenital candidiasis: Secondary | ICD-10-CM

## 2019-12-24 DIAGNOSIS — D539 Nutritional anemia, unspecified: Secondary | ICD-10-CM | POA: Diagnosis present

## 2019-12-24 DIAGNOSIS — N3091 Cystitis, unspecified with hematuria: Secondary | ICD-10-CM

## 2019-12-24 DIAGNOSIS — Z8616 Personal history of COVID-19: Secondary | ICD-10-CM | POA: Diagnosis present

## 2019-12-24 DIAGNOSIS — N138 Other obstructive and reflux uropathy: Secondary | ICD-10-CM | POA: Diagnosis present

## 2019-12-24 DIAGNOSIS — Z720 Tobacco use: Secondary | ICD-10-CM | POA: Diagnosis not present

## 2019-12-24 DIAGNOSIS — F4310 Post-traumatic stress disorder, unspecified: Secondary | ICD-10-CM | POA: Diagnosis present

## 2019-12-24 DIAGNOSIS — N1330 Unspecified hydronephrosis: Secondary | ICD-10-CM | POA: Diagnosis not present

## 2019-12-24 DIAGNOSIS — E871 Hypo-osmolality and hyponatremia: Secondary | ICD-10-CM | POA: Diagnosis present

## 2019-12-24 DIAGNOSIS — Z8051 Family history of malignant neoplasm of kidney: Secondary | ICD-10-CM | POA: Diagnosis not present

## 2019-12-24 DIAGNOSIS — N400 Enlarged prostate without lower urinary tract symptoms: Secondary | ICD-10-CM | POA: Diagnosis present

## 2019-12-24 DIAGNOSIS — R188 Other ascites: Secondary | ICD-10-CM

## 2019-12-24 DIAGNOSIS — R52 Pain, unspecified: Secondary | ICD-10-CM | POA: Diagnosis not present

## 2019-12-24 DIAGNOSIS — G8929 Other chronic pain: Secondary | ICD-10-CM | POA: Diagnosis present

## 2019-12-24 DIAGNOSIS — N390 Urinary tract infection, site not specified: Secondary | ICD-10-CM

## 2019-12-24 DIAGNOSIS — Z7189 Other specified counseling: Secondary | ICD-10-CM | POA: Diagnosis not present

## 2019-12-24 DIAGNOSIS — D510 Vitamin B12 deficiency anemia due to intrinsic factor deficiency: Secondary | ICD-10-CM | POA: Diagnosis present

## 2019-12-24 DIAGNOSIS — N189 Chronic kidney disease, unspecified: Secondary | ICD-10-CM

## 2019-12-24 DIAGNOSIS — M5412 Radiculopathy, cervical region: Secondary | ICD-10-CM | POA: Diagnosis present

## 2019-12-24 DIAGNOSIS — G894 Chronic pain syndrome: Secondary | ICD-10-CM | POA: Diagnosis present

## 2019-12-24 DIAGNOSIS — R531 Weakness: Secondary | ICD-10-CM | POA: Diagnosis not present

## 2019-12-24 DIAGNOSIS — E872 Acidosis: Secondary | ICD-10-CM | POA: Diagnosis present

## 2019-12-24 DIAGNOSIS — N179 Acute kidney failure, unspecified: Secondary | ICD-10-CM | POA: Diagnosis present

## 2019-12-24 DIAGNOSIS — Z8673 Personal history of transient ischemic attack (TIA), and cerebral infarction without residual deficits: Secondary | ICD-10-CM

## 2019-12-24 DIAGNOSIS — N170 Acute kidney failure with tubular necrosis: Principal | ICD-10-CM | POA: Diagnosis present

## 2019-12-24 DIAGNOSIS — Z8249 Family history of ischemic heart disease and other diseases of the circulatory system: Secondary | ICD-10-CM

## 2019-12-24 DIAGNOSIS — Z515 Encounter for palliative care: Secondary | ICD-10-CM | POA: Diagnosis not present

## 2019-12-24 DIAGNOSIS — Z9884 Bariatric surgery status: Secondary | ICD-10-CM

## 2019-12-24 DIAGNOSIS — F32A Depression, unspecified: Secondary | ICD-10-CM

## 2019-12-24 DIAGNOSIS — Z20822 Contact with and (suspected) exposure to covid-19: Secondary | ICD-10-CM | POA: Diagnosis present

## 2019-12-24 DIAGNOSIS — Z886 Allergy status to analgesic agent status: Secondary | ICD-10-CM

## 2019-12-24 DIAGNOSIS — D631 Anemia in chronic kidney disease: Secondary | ICD-10-CM

## 2019-12-24 DIAGNOSIS — E875 Hyperkalemia: Secondary | ICD-10-CM | POA: Diagnosis present

## 2019-12-24 DIAGNOSIS — E878 Other disorders of electrolyte and fluid balance, not elsewhere classified: Secondary | ICD-10-CM

## 2019-12-24 DIAGNOSIS — N185 Chronic kidney disease, stage 5: Secondary | ICD-10-CM | POA: Diagnosis present

## 2019-12-24 DIAGNOSIS — I6340 Cerebral infarction due to embolism of unspecified cerebral artery: Secondary | ICD-10-CM | POA: Diagnosis present

## 2019-12-24 DIAGNOSIS — Z681 Body mass index (BMI) 19 or less, adult: Secondary | ICD-10-CM

## 2019-12-24 DIAGNOSIS — F1190 Opioid use, unspecified, uncomplicated: Secondary | ICD-10-CM

## 2019-12-24 DIAGNOSIS — F419 Anxiety disorder, unspecified: Secondary | ICD-10-CM

## 2019-12-24 DIAGNOSIS — R319 Hematuria, unspecified: Secondary | ICD-10-CM | POA: Diagnosis present

## 2019-12-24 LAB — CBC WITH DIFFERENTIAL/PLATELET
Abs Immature Granulocytes: 0.03 10*3/uL (ref 0.00–0.07)
Basophils Absolute: 0 10*3/uL (ref 0.0–0.1)
Basophils Relative: 0 %
Eosinophils Absolute: 0.1 10*3/uL (ref 0.0–0.5)
Eosinophils Relative: 1 %
HCT: 28.3 % — ABNORMAL LOW (ref 39.0–52.0)
Hemoglobin: 8.9 g/dL — ABNORMAL LOW (ref 13.0–17.0)
Immature Granulocytes: 0 %
Lymphocytes Relative: 10 %
Lymphs Abs: 0.7 10*3/uL (ref 0.7–4.0)
MCH: 30.7 pg (ref 26.0–34.0)
MCHC: 31.4 g/dL (ref 30.0–36.0)
MCV: 97.6 fL (ref 80.0–100.0)
Monocytes Absolute: 0.7 10*3/uL (ref 0.1–1.0)
Monocytes Relative: 10 %
Neutro Abs: 5.6 10*3/uL (ref 1.7–7.7)
Neutrophils Relative %: 79 %
Platelets: 220 10*3/uL (ref 150–400)
RBC: 2.9 MIL/uL — ABNORMAL LOW (ref 4.22–5.81)
RDW: 18.3 % — ABNORMAL HIGH (ref 11.5–15.5)
WBC: 7.1 10*3/uL (ref 4.0–10.5)
nRBC: 0 % (ref 0.0–0.2)

## 2019-12-24 LAB — BASIC METABOLIC PANEL
Anion gap: 16 — ABNORMAL HIGH (ref 5–15)
BUN: 81 mg/dL — ABNORMAL HIGH (ref 6–20)
CO2: 7 mmol/L — ABNORMAL LOW (ref 22–32)
Calcium: 8 mg/dL — ABNORMAL LOW (ref 8.9–10.3)
Chloride: 108 mmol/L (ref 98–111)
Creatinine, Ser: 7.19 mg/dL — ABNORMAL HIGH (ref 0.61–1.24)
GFR calc Af Amer: 9 mL/min — ABNORMAL LOW (ref >60)
GFR calc non Af Amer: 8 mL/min — ABNORMAL LOW (ref >60)
Glucose, Bld: 99 mg/dL (ref 70–99)
Potassium: 5.5 mmol/L — ABNORMAL HIGH (ref 3.5–5.1)
Sodium: 131 mmol/L — ABNORMAL LOW (ref 135–145)

## 2019-12-24 LAB — URINALYSIS, ROUTINE W REFLEX MICROSCOPIC
Bilirubin Urine: NEGATIVE
Glucose, UA: NEGATIVE mg/dL
Ketones, ur: NEGATIVE mg/dL
Nitrite: NEGATIVE
Protein, ur: 100 mg/dL — AB
RBC / HPF: >50 RBC/hpf — ABNORMAL HIGH (ref 0–5)
Specific Gravity, Urine: 1.011 (ref 1.005–1.030)
WBC, UA: >50 WBC/hpf — ABNORMAL HIGH (ref 0–5)
pH: 5 (ref 5.0–8.0)

## 2019-12-24 LAB — RESPIRATORY PANEL BY RT PCR (FLU A&B, COVID)
Influenza A by PCR: NEGATIVE
Influenza B by PCR: NEGATIVE
SARS Coronavirus 2 by RT PCR: NEGATIVE

## 2019-12-24 LAB — VITAMIN B12: Vitamin B-12: 588 pg/mL (ref 180–914)

## 2019-12-24 LAB — HEMOGLOBIN AND HEMATOCRIT, BLOOD
HCT: 29.4 % — ABNORMAL LOW (ref 39.0–52.0)
Hemoglobin: 9.4 g/dL — ABNORMAL LOW (ref 13.0–17.0)

## 2019-12-24 LAB — CREATININE, URINE, RANDOM: Creatinine, Urine: 34.21 mg/dL

## 2019-12-24 LAB — SODIUM, URINE, RANDOM: Sodium, Ur: 86 mmol/L

## 2019-12-24 LAB — FOLATE: Folate: 7.5 ng/mL (ref >5.9)

## 2019-12-24 MED ORDER — SODIUM BICARBONATE 8.4 % IV SOLN
INTRAVENOUS | Status: DC
  Filled 2019-12-24: qty 150
  Filled 2019-12-24 (×2): qty 850
  Filled 2019-12-24 (×4): qty 150

## 2019-12-24 MED ORDER — SODIUM BICARBONATE 8.4 % IV SOLN: INTRAVENOUS | Status: DC

## 2019-12-24 MED ORDER — HYDROCODONE-ACETAMINOPHEN 5-325 MG PO TABS
1.0000 | ORAL_TABLET | ORAL | Status: DC | PRN
  Administered 2019-12-24 – 2019-12-25 (×7): 1 via ORAL
  Administered 2019-12-26: 2 via ORAL
  Administered 2019-12-26 (×6): 1 via ORAL
  Administered 2019-12-27 (×2): 2 via ORAL
  Filled 2019-12-24 (×19): qty 1

## 2019-12-24 MED ORDER — NICOTINE 14 MG/24HR TD PT24
14.0000 mg | MEDICATED_PATCH | Freq: Every day | TRANSDERMAL | Status: DC
  Filled 2019-12-24 (×7): qty 1

## 2019-12-24 MED ORDER — ENSURE ENLIVE PO LIQD
237.0000 mL | Freq: Three times a day (TID) | ORAL | Status: DC
  Administered 2019-12-24 – 2019-12-26 (×3): 237 mL via ORAL

## 2019-12-24 MED ORDER — ADULT MULTIVITAMIN W/MINERALS CH
1.0000 | ORAL_TABLET | Freq: Every day | ORAL | Status: DC
  Administered 2019-12-24 – 2020-01-07 (×13): 1 via ORAL
  Filled 2019-12-24 (×15): qty 1

## 2019-12-24 MED ORDER — TAMSULOSIN HCL 0.4 MG PO CAPS
0.4000 mg | ORAL_CAPSULE | Freq: Every day | ORAL | Status: DC
  Administered 2019-12-26 – 2020-01-08 (×14): 0.4 mg via ORAL
  Filled 2019-12-24 (×15): qty 1

## 2019-12-24 MED ORDER — ADULT MULTIVITAMIN W/MINERALS CH: 1.0000 | ORAL_TABLET | Freq: Every day | ORAL | Status: DC

## 2019-12-24 MED ORDER — SODIUM ZIRCONIUM CYCLOSILICATE 10 G PO PACK
10.0000 g | PACK | Freq: Once | ORAL | Status: AC
  Administered 2019-12-24: 10 g via ORAL
  Filled 2019-12-24: qty 1

## 2019-12-24 MED ORDER — SODIUM BICARBONATE-DEXTROSE 150-5 MEQ/L-% IV SOLN
150.0000 meq | INTRAVENOUS | Status: DC
  Filled 2019-12-24: qty 1000

## 2019-12-24 MED ORDER — ACETAMINOPHEN 650 MG RE SUPP: 650.0000 mg | Freq: Four times a day (QID) | RECTAL | Status: DC | PRN

## 2019-12-24 MED ORDER — ACETAMINOPHEN 325 MG PO TABS
650.0000 mg | ORAL_TABLET | Freq: Four times a day (QID) | ORAL | Status: DC | PRN
  Administered 2019-12-26: 650 mg via ORAL
  Filled 2019-12-24: qty 2

## 2019-12-24 NOTE — Consult Note (Signed)
H&P Physician requesting consult: Cherylann Ratel  Chief Complaint: Bilateral hydronephrosis  History of Present Illness: 54 year old male recently underwent a cystoscopy with cystogram, bilateral retrograde pyelogram, left ureteral stent placement by Dr. Alinda Money on 11/15/2019.  Patient was noted to have bilateral hydronephrosis.  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  A left ureteral stent was placed.  A right ureteral stent was unable to be placed due to the tortuosity of the ureter.  He subsequently had a right nephrostomy tube placed followed by internalization of the right-sided stent.  Patient presented with gross hematuria.  He also had some dysuria.  In the emergency department, he was found to have a creatinine of 7.  CT scan was performed that showed bilateral hydronephrosis despite good positioning of his stents.  Foley catheter appeared to be in good position.  Past Medical History:  Diagnosis Date  . Cervical radiculopathy    with left sided weakness  . Chemical exposure    service related injury  . Chronic pain   . GIB (gastrointestinal bleeding)   . PTSD (post-traumatic stress disorder)   . Renal disorder    CKD  . Urinary retention    due to bladder outlet obstruction   Past Surgical History:  Procedure Laterality Date  . CYSTOSCOPY W/ URETERAL STENT PLACEMENT N/A 11/14/2019   Procedure: CYSTOSCOPY WITH RETROGRADE PYELOGRAM bilateral Wyvonnia Dusky STENT PLACEMENT left fulguration bladder . cystogram;  Surgeon: Raynelle Bring, MD;  Location: [facility];  Service: Urology;  Laterality: N/A;  . GASTRIC BYPASS    . HERNIA REPAIR    . IR NEPHROSTOMY PLACEMENT RIGHT  11/16/2019  . IR SINUS/FIST TUBE CHK-NON GI  12/03/2019  . IR URETERAL STENT PLACEMENT EXISTING ACCESS RIGHT  12/08/2019    Home Medications:  Medications Prior to Admission  Medication Sig Dispense Refill Last Dose  . cyanocobalamin (,VITAMIN B-12,) 1000 MCG/ML injection Inject 1,000 mcg into the muscle every 30  (thirty) days.      Marland Kitchen docusate sodium (COLACE) 100 MG capsule Take 1 capsule (100 mg total) by mouth 2 (two) times daily as needed for mild constipation. 10 capsule 0 Past Month at Unknown time  . DULoxetine (CYMBALTA) 30 MG capsule Take 30-60 mg by mouth See admin instructions. Take 2 capsules in the morning and 1 capsule in the evening   12/23/2019 at Unknown time  . gabapentin (NEURONTIN) 400 MG capsule Take 800 mg by mouth at bedtime.   12/23/2019 at Unknown time  . HYDROcodone-acetaminophen (NORCO/VICODIN) 5-325 MG tablet Take 1-2 tablets by mouth every 4 (four) hours as needed for up to 10 doses for moderate pain or severe pain (1 moderate, 2 severe). 10 tablet 0 12/23/2019 at Unknown time  . hydrOXYzine (ATARAX/VISTARIL) 10 MG tablet Take 1 tablet (10 mg total) by mouth 3 (three) times daily as needed for itching or anxiety. 30 tablet 0 Past Week at Unknown time  . midodrine (PROAMATINE) 2.5 MG tablet Take 2 tablets (5 mg total) by mouth 3 (three) times daily with meals. (Patient taking differently: Take 2.5-5 mg by mouth 3 (three) times daily as needed (hypotension). )   Past Week at Unknown time  . Multiple Vitamin (MULTIVITAMIN WITH MINERALS) TABS tablet Take 1 tablet by mouth daily.   Past Week at Unknown time  . tamsulosin (FLOMAX) 0.4 MG CAPS capsule Take 0.4 mg by mouth daily.    12/23/2019 at Unknown time  . feeding supplement, ENSURE ENLIVE, (ENSURE ENLIVE) LIQD Take 237 mLs by mouth 3 (  three) times daily between meals. (Patient not taking: Reported on 12/24/2019) 237 mL 12 Not Taking at Unknown time   Allergies:  Allergies  Allergen Reactions  . Nsaids Other (See Comments)    Bleeding GI Ulceration    Family History  Problem Relation Age of Onset  . Renal cancer Father   . Heart disease Paternal Grandfather    Social History:  reports that he has never smoked. He uses smokeless tobacco. He reports that he does not drink alcohol and does not use drugs.  ROS: A complete review of  systems was performed.  All systems are negative except for pertinent findings as noted. ROS   Physical Exam:  Vital signs in last 24 hours: Temp:  [97.6 F (36.4 C)-98.1 F (36.7 C)] 97.6 F (36.4 C) (09/24 1755) Pulse Rate:  [66-70] 67 (09/24 1755) Resp:  [16-20] 18 (09/24 1755) BP: (101-120)/(64-72) 112/65 (09/24 1755) SpO2:  [99 %-100 %] 100 % (09/24 1755) General:  Alert and oriented, No acute distress HEENT: Normocephalic, atraumatic Neck: No JVD or lymphadenopathy Cardiovascular: Regular rate and rhythm Lungs: Regular rate and effort Abdomen: Soft, nontender, nondistended, no abdominal masses Back: No CVA tenderness Extremities: No edema Genitourinary: Foley catheter in place draining pink urine Neurologic: Grossly intact  Laboratory Data:  Results for orders placed or performed during the hospital encounter of 12/24/19 (from the past 24 hour(s))  Basic metabolic panel     Status: Abnormal   Collection Time: 12/24/19 10:23 AM  Result Value Ref Range   Sodium 131 (L) 135 - 145 mmol/L   Potassium 5.5 (H) 3.5 - 5.1 mmol/L   Chloride 108 98 - 111 mmol/L   CO2 7 (L) 22 - 32 mmol/L   Glucose, Bld 99 70 - 99 mg/dL   BUN 81 (H) 6 - 20 mg/dL   Creatinine, Ser 7.19 (H) 0.61 - 1.24 mg/dL   Calcium 8.0 (L) 8.9 - 10.3 mg/dL   GFR calc non Af Amer 8 (L) >60 mL/min   GFR calc Af Amer 9 (L) >60 mL/min   Anion gap 16 (H) 5 - 15  CBC with Differential     Status: Abnormal   Collection Time: 12/24/19 10:23 AM  Result Value Ref Range   WBC 7.1 4.0 - 10.5 K/uL   RBC 2.90 (L) 4.22 - 5.81 MIL/uL   Hemoglobin 8.9 (L) 13.0 - 17.0 g/dL   HCT 28.3 (L) 39 - 52 %   MCV 97.6 80.0 - 100.0 fL   MCH 30.7 26.0 - 34.0 pg   MCHC 31.4 30.0 - 36.0 g/dL   RDW 18.3 (H) 11.5 - 15.5 %   Platelets 220 150 - 400 K/uL   nRBC 0.0 0.0 - 0.2 %   Neutrophils Relative % 79 %   Neutro Abs 5.6 1.7 - 7.7 K/uL   Lymphocytes Relative 10 %   Lymphs Abs 0.7 0.7 - 4.0 K/uL   Monocytes Relative 10 %    Monocytes Absolute 0.7 0 - 1 K/uL   Eosinophils Relative 1 %   Eosinophils Absolute 0.1 0 - 0 K/uL   Basophils Relative 0 %   Basophils Absolute 0.0 0 - 0 K/uL   Immature Granulocytes 0 %   Abs Immature Granulocytes 0.03 0.00 - 0.07 K/uL  Urinalysis, Routine w reflex microscopic Urine, Clean Catch     Status: Abnormal   Collection Time: 12/24/19 10:33 AM  Result Value Ref Range   Color, Urine YELLOW YELLOW   APPearance TURBID (A) CLEAR  Specific Gravity, Urine 1.011 1.005 - 1.030   pH 5.0 5.0 - 8.0   Glucose, UA NEGATIVE NEGATIVE mg/dL   Hgb urine dipstick LARGE (A) NEGATIVE   Bilirubin Urine NEGATIVE NEGATIVE   Ketones, ur NEGATIVE NEGATIVE mg/dL   Protein, ur 100 (A) NEGATIVE mg/dL   Nitrite NEGATIVE NEGATIVE   Leukocytes,Ua LARGE (A) NEGATIVE   RBC / HPF >50 (H) 0 - 5 RBC/hpf   WBC, UA >50 (H) 0 - 5 WBC/hpf   Bacteria, UA MANY (A) NONE SEEN   WBC Clumps PRESENT   Respiratory Panel by RT PCR (Flu A&B, Covid) - Nasopharyngeal Swab     Status: None   Collection Time: 12/24/19  2:22 PM   Specimen: Nasopharyngeal Swab  Result Value Ref Range   SARS Coronavirus 2 by RT PCR NEGATIVE NEGATIVE   Influenza A by PCR NEGATIVE NEGATIVE   Influenza B by PCR NEGATIVE NEGATIVE   Recent Results (from the past 240 hour(s))  Respiratory Panel by RT PCR (Flu A&B, Covid) - Nasopharyngeal Swab     Status: None   Collection Time: 12/24/19  2:22 PM   Specimen: Nasopharyngeal Swab  Result Value Ref Range Status   SARS Coronavirus 2 by RT PCR NEGATIVE NEGATIVE Final    Comment: (NOTE) SARS-CoV-2 target nucleic acids are NOT DETECTED.  The SARS-CoV-2 RNA is generally detectable in upper respiratoy specimens during the acute phase of infection. The lowest concentration of SARS-CoV-2 viral copies this assay can detect is 131 copies/mL. A negative result does not preclude SARS-Cov-2 infection and should not be used as the sole basis for treatment or other patient management decisions. A negative  result may occur with  improper specimen collection/handling, submission of specimen other than nasopharyngeal swab, presence of viral mutation(s) within the areas targeted by this assay, and inadequate number of viral copies (<131 copies/mL). A negative result must be combined with clinical observations, patient history, and epidemiological information. The expected result is Negative.  Fact Sheet for Patients:  PinkCheek.be  Fact Sheet for Healthcare Providers:  GravelBags.it  This test is no t yet approved or cleared by the [place] FDA and  has been authorized for detection and/or diagnosis of SARS-CoV-2 by FDA under an Emergency Use Authorization (EUA). This EUA will remain  in effect (meaning this test can be used) for the duration of the COVID-19 declaration under Section 564(b)(1) of the Act, 21 U.S.C. section 360bbb-3(b)(1), unless the authorization is terminated or revoked sooner.     Influenza A by PCR NEGATIVE NEGATIVE Final   Influenza B by PCR NEGATIVE NEGATIVE Final    Comment: (NOTE) The Xpert Xpress SARS-CoV-2/FLU/RSV assay is intended as an aid in  the diagnosis of influenza from Nasopharyngeal swab specimens and  should not be used as a sole basis for treatment. Nasal washings and  aspirates are unacceptable for Xpert Xpress SARS-CoV-2/FLU/RSV  testing.  Fact Sheet for Patients: PinkCheek.be  Fact Sheet for Healthcare Providers: GravelBags.it  This test is not yet approved or cleared by the [place] FDA and  has been authorized for detection and/or diagnosis of SARS-CoV-2 by  FDA under an Emergency Use Authorization (EUA). This EUA will remain  in effect (meaning this test can be used) for the duration of the  Covid-19 declaration under Section 564(b)(1) of the Act, 21  U.S.C. section 360bbb-3(b)(1), unless the authorization is   terminated or revoked. Performed at [facility], [place] [address] [place], [place] [address]    Creatinine:  Recent Labs    12/24/19 1023  CREATININE 7.19*    Impression/Assessment:  Bilateral hydronephrosis Acute renal insufficiency Colovesical fistula  Plan:  Continue Foley catheter.  Monitor renal function.  If renal function does not improve, recommend bilateral nephrostomy tube placement.  Marton Redwood, III 12/24/2019, 6:29 PM

## 2019-12-24 NOTE — ED Notes (Signed)
New catheter bag placed.

## 2019-12-24 NOTE — H&P (View-Only) (Signed)
H&P Physician requesting consult: Cherylann Ratel  Chief Complaint: Bilateral hydronephrosis  History of Present Illness: 54 year old male recently underwent a cystoscopy with cystogram, bilateral retrograde pyelogram, left ureteral stent placement by Dr. Alinda Money on 11/15/2019.  Patient was noted to have bilateral hydronephrosis.  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  A left ureteral stent was placed.  A right ureteral stent was unable to be placed due to the tortuosity of the ureter.  He subsequently had a right nephrostomy tube placed followed by internalization of the right-sided stent.  Patient presented with gross hematuria.  He also had some dysuria.  In the emergency department, he was found to have a creatinine of 7.  CT scan was performed that showed bilateral hydronephrosis despite good positioning of his stents.  Foley catheter appeared to be in good position.  Past Medical History:  Diagnosis Date  . Cervical radiculopathy    with left sided weakness  . Chemical exposure    service related injury  . Chronic pain   . GIB (gastrointestinal bleeding)   . PTSD (post-traumatic stress disorder)   . Renal disorder    CKD  . Urinary retention    due to bladder outlet obstruction   Past Surgical History:  Procedure Laterality Date  . CYSTOSCOPY W/ URETERAL STENT PLACEMENT N/A 11/14/2019   Procedure: CYSTOSCOPY WITH RETROGRADE PYELOGRAM bilateral Wyvonnia Dusky STENT PLACEMENT left fulguration bladder . cystogram;  Surgeon: Raynelle Bring, MD;  Location: [facility];  Service: Urology;  Laterality: N/A;  . GASTRIC BYPASS    . HERNIA REPAIR    . IR NEPHROSTOMY PLACEMENT RIGHT  11/16/2019  . IR SINUS/FIST TUBE CHK-NON GI  12/03/2019  . IR URETERAL STENT PLACEMENT EXISTING ACCESS RIGHT  12/08/2019    Home Medications:  Medications Prior to Admission  Medication Sig Dispense Refill Last Dose  . cyanocobalamin (,VITAMIN B-12,) 1000 MCG/ML injection Inject 1,000 mcg into the muscle every 30  (thirty) days.      Marland Kitchen docusate sodium (COLACE) 100 MG capsule Take 1 capsule (100 mg total) by mouth 2 (two) times daily as needed for mild constipation. 10 capsule 0 Past Month at Unknown time  . DULoxetine (CYMBALTA) 30 MG capsule Take 30-60 mg by mouth See admin instructions. Take 2 capsules in the morning and 1 capsule in the evening   12/23/2019 at Unknown time  . gabapentin (NEURONTIN) 400 MG capsule Take 800 mg by mouth at bedtime.   12/23/2019 at Unknown time  . HYDROcodone-acetaminophen (NORCO/VICODIN) 5-325 MG tablet Take 1-2 tablets by mouth every 4 (four) hours as needed for up to 10 doses for moderate pain or severe pain (1 moderate, 2 severe). 10 tablet 0 12/23/2019 at Unknown time  . hydrOXYzine (ATARAX/VISTARIL) 10 MG tablet Take 1 tablet (10 mg total) by mouth 3 (three) times daily as needed for itching or anxiety. 30 tablet 0 Past Week at Unknown time  . midodrine (PROAMATINE) 2.5 MG tablet Take 2 tablets (5 mg total) by mouth 3 (three) times daily with meals. (Patient taking differently: Take 2.5-5 mg by mouth 3 (three) times daily as needed (hypotension). )   Past Week at Unknown time  . Multiple Vitamin (MULTIVITAMIN WITH MINERALS) TABS tablet Take 1 tablet by mouth daily.   Past Week at Unknown time  . tamsulosin (FLOMAX) 0.4 MG CAPS capsule Take 0.4 mg by mouth daily.    12/23/2019 at Unknown time  . feeding supplement, ENSURE ENLIVE, (ENSURE ENLIVE) LIQD Take 237 mLs by mouth 3 (  three) times daily between meals. (Patient not taking: Reported on 12/24/2019) 237 mL 12 Not Taking at Unknown time   Allergies:  Allergies  Allergen Reactions  . Nsaids Other (See Comments)    Bleeding GI Ulceration    Family History  Problem Relation Age of Onset  . Renal cancer Father   . Heart disease Paternal Grandfather    Social History:  reports that he has never smoked. He uses smokeless tobacco. He reports that he does not drink alcohol and does not use drugs.  ROS: A complete review of  systems was performed.  All systems are negative except for pertinent findings as noted. ROS   Physical Exam:  Vital signs in last 24 hours: Temp:  [97.6 F (36.4 C)-98.1 F (36.7 C)] 97.6 F (36.4 C) (09/24 1755) Pulse Rate:  [66-70] 67 (09/24 1755) Resp:  [16-20] 18 (09/24 1755) BP: (101-120)/(64-72) 112/65 (09/24 1755) SpO2:  [99 %-100 %] 100 % (09/24 1755) General:  Alert and oriented, No acute distress HEENT: Normocephalic, atraumatic Neck: No JVD or lymphadenopathy Cardiovascular: Regular rate and rhythm Lungs: Regular rate and effort Abdomen: Soft, nontender, nondistended, no abdominal masses Back: No CVA tenderness Extremities: No edema Genitourinary: Foley catheter in place draining pink urine Neurologic: Grossly intact  Laboratory Data:  Results for orders placed or performed during the hospital encounter of 12/24/19 (from the past 24 hour(s))  Basic metabolic panel     Status: Abnormal   Collection Time: 12/24/19 10:23 AM  Result Value Ref Range   Sodium 131 (L) 135 - 145 mmol/L   Potassium 5.5 (H) 3.5 - 5.1 mmol/L   Chloride 108 98 - 111 mmol/L   CO2 7 (L) 22 - 32 mmol/L   Glucose, Bld 99 70 - 99 mg/dL   BUN 81 (H) 6 - 20 mg/dL   Creatinine, Ser 7.19 (H) 0.61 - 1.24 mg/dL   Calcium 8.0 (L) 8.9 - 10.3 mg/dL   GFR calc non Af Amer 8 (L) >60 mL/min   GFR calc Af Amer 9 (L) >60 mL/min   Anion gap 16 (H) 5 - 15  CBC with Differential     Status: Abnormal   Collection Time: 12/24/19 10:23 AM  Result Value Ref Range   WBC 7.1 4.0 - 10.5 K/uL   RBC 2.90 (L) 4.22 - 5.81 MIL/uL   Hemoglobin 8.9 (L) 13.0 - 17.0 g/dL   HCT 28.3 (L) 39 - 52 %   MCV 97.6 80.0 - 100.0 fL   MCH 30.7 26.0 - 34.0 pg   MCHC 31.4 30.0 - 36.0 g/dL   RDW 18.3 (H) 11.5 - 15.5 %   Platelets 220 150 - 400 K/uL   nRBC 0.0 0.0 - 0.2 %   Neutrophils Relative % 79 %   Neutro Abs 5.6 1.7 - 7.7 K/uL   Lymphocytes Relative 10 %   Lymphs Abs 0.7 0.7 - 4.0 K/uL   Monocytes Relative 10 %    Monocytes Absolute 0.7 0 - 1 K/uL   Eosinophils Relative 1 %   Eosinophils Absolute 0.1 0 - 0 K/uL   Basophils Relative 0 %   Basophils Absolute 0.0 0 - 0 K/uL   Immature Granulocytes 0 %   Abs Immature Granulocytes 0.03 0.00 - 0.07 K/uL  Urinalysis, Routine w reflex microscopic Urine, Clean Catch     Status: Abnormal   Collection Time: 12/24/19 10:33 AM  Result Value Ref Range   Color, Urine YELLOW YELLOW   APPearance TURBID (A) CLEAR  Specific Gravity, Urine 1.011 1.005 - 1.030   pH 5.0 5.0 - 8.0   Glucose, UA NEGATIVE NEGATIVE mg/dL   Hgb urine dipstick LARGE (A) NEGATIVE   Bilirubin Urine NEGATIVE NEGATIVE   Ketones, ur NEGATIVE NEGATIVE mg/dL   Protein, ur 100 (A) NEGATIVE mg/dL   Nitrite NEGATIVE NEGATIVE   Leukocytes,Ua LARGE (A) NEGATIVE   RBC / HPF >50 (H) 0 - 5 RBC/hpf   WBC, UA >50 (H) 0 - 5 WBC/hpf   Bacteria, UA MANY (A) NONE SEEN   WBC Clumps PRESENT   Respiratory Panel by RT PCR (Flu A&B, Covid) - Nasopharyngeal Swab     Status: None   Collection Time: 12/24/19  2:22 PM   Specimen: Nasopharyngeal Swab  Result Value Ref Range   SARS Coronavirus 2 by RT PCR NEGATIVE NEGATIVE   Influenza A by PCR NEGATIVE NEGATIVE   Influenza B by PCR NEGATIVE NEGATIVE   Recent Results (from the past 240 hour(s))  Respiratory Panel by RT PCR (Flu A&B, Covid) - Nasopharyngeal Swab     Status: None   Collection Time: 12/24/19  2:22 PM   Specimen: Nasopharyngeal Swab  Result Value Ref Range Status   SARS Coronavirus 2 by RT PCR NEGATIVE NEGATIVE Final    Comment: (NOTE) SARS-CoV-2 target nucleic acids are NOT DETECTED.  The SARS-CoV-2 RNA is generally detectable in upper respiratoy specimens during the acute phase of infection. The lowest concentration of SARS-CoV-2 viral copies this assay can detect is 131 copies/mL. A negative result does not preclude SARS-Cov-2 infection and should not be used as the sole basis for treatment or other patient management decisions. A negative  result may occur with  improper specimen collection/handling, submission of specimen other than nasopharyngeal swab, presence of viral mutation(s) within the areas targeted by this assay, and inadequate number of viral copies (<131 copies/mL). A negative result must be combined with clinical observations, patient history, and epidemiological information. The expected result is Negative.  Fact Sheet for Patients:  PinkCheek.be  Fact Sheet for Healthcare Providers:  GravelBags.it  This test is no t yet approved or cleared by the [place] FDA and  has been authorized for detection and/or diagnosis of SARS-CoV-2 by FDA under an Emergency Use Authorization (EUA). This EUA will remain  in effect (meaning this test can be used) for the duration of the COVID-19 declaration under Section 564(b)(1) of the Act, 21 U.S.C. section 360bbb-3(b)(1), unless the authorization is terminated or revoked sooner.     Influenza A by PCR NEGATIVE NEGATIVE Final   Influenza B by PCR NEGATIVE NEGATIVE Final    Comment: (NOTE) The Xpert Xpress SARS-CoV-2/FLU/RSV assay is intended as an aid in  the diagnosis of influenza from Nasopharyngeal swab specimens and  should not be used as a sole basis for treatment. Nasal washings and  aspirates are unacceptable for Xpert Xpress SARS-CoV-2/FLU/RSV  testing.  Fact Sheet for Patients: PinkCheek.be  Fact Sheet for Healthcare Providers: GravelBags.it  This test is not yet approved or cleared by the [place] FDA and  has been authorized for detection and/or diagnosis of SARS-CoV-2 by  FDA under an Emergency Use Authorization (EUA). This EUA will remain  in effect (meaning this test can be used) for the duration of the  Covid-19 declaration under Section 564(b)(1) of the Act, 21  U.S.C. section 360bbb-3(b)(1), unless the authorization is   terminated or revoked. Performed at [facility], [place] [address], [place], [place] [address]    Creatinine:  Recent Labs    12/24/19 1023  CREATININE 7.19*    Impression/Assessment:  Bilateral hydronephrosis Acute renal insufficiency Colovesical fistula  Plan:  Continue Foley catheter.  Monitor renal function.  If renal function does not improve, recommend bilateral nephrostomy tube placement.  Marton Redwood, III 12/24/2019, 6:29 PM

## 2019-12-24 NOTE — Progress Notes (Signed)
TOC CM contacted [facility] and left message for return call. Pt is receiving SNF rehab, wanted to verify pt can return once he is ready for dc. [place], [place] ED TOC CM [phone]

## 2019-12-24 NOTE — Progress Notes (Signed)
Pt admitted to [facility] from ED. Pt alert and oriented in no apparent distress. Pt oriented to room and call light. Bed in lowest and locked position. Skin assessment completed and telemetry placed. SCDs ordered. EKG that was ordered in ED was completed on unit. Unable to obtain daily weight since bed was not zeroed prior to patient arrival. Will continue to monitor with hourly, purposeful rounding. Urine specimen sent. Pt resting comfortably in bed eating a renal diet. Pain, position, and toileting evaluated.

## 2019-12-24 NOTE — ED Triage Notes (Signed)
Pt BIBA from [facility] c/o hematuria starting this AM.  Pt arrives with foley catheter. EMS reports abnormal creatine levels.   AOx4.   100/56 70

## 2019-12-24 NOTE — Plan of Care (Signed)
  Problem: Activity: Goal: Risk for activity intolerance will decrease Outcome: Progressing   

## 2019-12-24 NOTE — Consult Note (Signed)
Renal Service Consult Note [name]  Chase [name] 12/24/2019 [name] Requesting Physician:  Chase Marylyn Ishihara, T.   Reason for Consult:  Renal failure HPI: The patient is a 54 y.o. year-old w/ hx of urinary retention, CKD, GIB, chronic pain, bilat ureteral stenting, hx R pcn, h/o gastric bypass who presented w/ hematuria sent from [facility] to ED.  Has indwelling foley cath. Had ureteral stent placed last week. Some dysuria. 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 Creat is 7.1 (2.4- 2.8 baseline). Asked to see for renal failure.   Pt has foley cath in. No abd pain or n/v/d, no SOB , no confusion or jerking of extremities.    Date   Creat  eGFR  July 2021  4.10 >> 2.48   AKI episode, 17 > 28 ml/min  Aug 2021  3.09 >> 2.41 AKI episode, 22 > 29 ml/min  Sept 8, 2021  2.78  24 ml/min   Sept 24, 2021  7.19  8 ml/min   ROS  denies CP  no joint pain   no HA  no blurry vision  no rash  no diarrhea  no nausea/ vomiting     Past Medical History  Past Medical History:  Diagnosis Date  . Cervical radiculopathy    with left sided weakness  . Chemical exposure    service related injury  . Chronic pain   . GIB (gastrointestinal bleeding)   . PTSD (post-traumatic stress disorder)   . Renal disorder    CKD  . Urinary retention    due to bladder outlet obstruction   Past Surgical History  Past Surgical History:  Procedure Laterality Date  . CYSTOSCOPY W/ URETERAL STENT PLACEMENT N/A 11/14/2019   Procedure: CYSTOSCOPY WITH RETROGRADE PYELOGRAM bilateral Wyvonnia Dusky STENT PLACEMENT left fulguration bladder . cystogram;  Surgeon: Chase Bring, [name];  Location: [facility];  Service: Urology;  Laterality: N/A;  . GASTRIC BYPASS    . HERNIA REPAIR    . IR NEPHROSTOMY PLACEMENT RIGHT  11/16/2019  . IR SINUS/FIST TUBE CHK-NON GI  12/03/2019  . IR URETERAL STENT PLACEMENT EXISTING ACCESS RIGHT  12/08/2019   Family History   Family History  Problem Relation Age of Onset  . Renal cancer Father   . Heart disease Paternal Grandfather    Social History  reports that he has never smoked. He uses smokeless tobacco. He reports that he does not drink alcohol and does not use drugs. Allergies  Allergies  Allergen Reactions  . Nsaids Other (See Comments)    Bleeding GI Ulceration   Home medications Prior to Admission medications   Medication Sig Start Date End Date Taking? Authorizing Provider  cyanocobalamin (,VITAMIN B-12,) 1000 MCG/ML injection Inject 1,000 mcg into the muscle every 30 (thirty) days.  12/28/13  Yes Chase [name]  docusate sodium (COLACE) 100 MG capsule Take 1 capsule (100 mg total) by mouth 2 (two) times daily as needed for mild constipation. 11/19/19  Yes Chase Pert, [name]  DULoxetine (CYMBALTA) 30 MG capsule Take 30-60 mg by mouth See admin instructions. Take 2 capsules in the morning and 1 capsule in the evening 08/12/17  Yes Chase [name]  gabapentin (NEURONTIN) 400 MG capsule Take 800 mg by mouth at bedtime.   Yes Chase [name]  HYDROcodone-acetaminophen (NORCO/VICODIN) 5-325 MG tablet Take 1-2 tablets by mouth every 4 (four) hours as needed for up to 10 doses for moderate pain or severe pain (1  moderate, 2 severe). 11/19/19  Yes Chase Pert, [name]  hydrOXYzine (ATARAX/VISTARIL) 10 MG tablet Take 1 tablet (10 mg total) by mouth 3 (three) times daily as needed for itching or anxiety. 10/26/19  Yes Mikhail, Maryann, DO  midodrine (PROAMATINE) 2.5 MG tablet Take 2 tablets (5 mg total) by mouth 3 (three) times daily with meals. Patient taking differently: Take 2.5-5 mg by mouth 3 (three) times daily as needed (hypotension).  11/19/19  Yes Chase Pert, [name]  Multiple Vitamin (MULTIVITAMIN WITH MINERALS) TABS tablet Take 1 tablet by mouth daily. 10/27/19  Yes Mikhail, Velta Addison, DO  tamsulosin (FLOMAX) 0.4 MG CAPS capsule Take 0.4 mg by mouth daily.    Yes Chase [name]   feeding supplement, ENSURE ENLIVE, (ENSURE ENLIVE) LIQD Take 237 mLs by mouth 3 (three) times daily between meals. Patient not taking: Reported on 12/24/2019 10/26/19   Chase Ford, DO     Vitals:   12/24/19 0851 12/24/19 0900 12/24/19 1025 12/24/19 1217  BP: 101/72  112/64 114/66  Pulse: 69  67 70  Resp: 20  18 18   Temp: 98 F (36.7 C)     TempSrc: Oral     SpO2: 100% 100% 100% 99%   Exam Gen pale middle-aged WM, no distress, calm and reponsive No rash, cyanosis or gangrene Sclera anicteric, throat clear and moist  No jvd or bruits Chest clear bilat to bases, no rales or wheezing RRR no MRG Abd soft ntnd no mass or ascites +bs GU normal male w/ foley cath draining large amounts bloody urine MS no joint effusions or deformity Ext no leg or UE edema, no wounds or ulcers Neuro is alert, Ox 3 , nf, no asterixis    Home meds:  - cymbalta 30-60 hs/ neurontin 800 hs/ norco qid prn  - midodrine 2.5- 5 tid prn  - flomax 0.4 qd  - ensure tid bm/ MVI/ atarax prn      CT renal stone study noncon >  IMPRESSION: 1. Reaccumulation of small to moderate volume of fluid above the urinary bladder in this patient with suspected defect in the urinary bladder. No adjacent colonic thickening. Study limited by lack of rectal or vesicle contrast with respect to detection of colovesical fistula though the fat plane appears reasonably well preserved between the collection in the colon on the current study. 2. Interval development of moderate to marked bilateral hydroureteronephrosis despite presence of bilateral ureteral stents. 3. Aortic atherosclerosis.    VS BP 114/ 66, HR 70  RR 18  RA 100%  Temp 98   Na 131 K 5.5  CO2 7  BUN 81  Cr 7.19  Ca 8.0  Hb 8.9  WBC 7k  plt 220   COVID negative   UA many bact >50 rbc/ wbc, 100 prot, turbid     Assessment/ Plan: 1. AoCKD 4 - b/l creat 2.4- 2.8, eGFR 24- 31m/min.  Creat 7.1 here. No gross uremic symptoms/ signs, pt is pleasant and interactive.   Euvolemic or possibly a bit dry on exam.  Foley in place.  Urology consulted for bilat severe hydro.  Suspect obstruction is primary issue. Agree w/ IVF's / bicarb at 100/ hr. Get urine lytes. Will follow.  2. Met acidosis - severe, pt not symptomatic , getting bicarb gtt 3. Mild hyperkalemia - use RENAL DIET if he gets to eat, use lokelma prn 4. Hematuria/ bilat hydronephrosis/ hx ureteral stents - urology to see 5. H/o CVA - left hemiparesis, SNF dependent  Kelly Splinter  [name] 12/24/2019, 3:35 PM  Recent Labs  Lab 12/24/19 1023  WBC 7.1  HGB 8.9*   Recent Labs  Lab 12/24/19 1023  K 5.5*  BUN 81*  CREATININE 7.19*  CALCIUM 8.0*

## 2019-12-24 NOTE — H&P (Signed)
.  History and Physical    Chase [name] QPY:[id number] DOB: Oct 16, 1965 DOA: 12/24/2019  PCP: [name]  Patient coming from: Home  Chief Complaint: Hematuria  HPI: Chase [name] is a 54 y.o. male with medical history significant of bladder outlet obstruction and ureteral stents. Presents with 3 days of worsening hematuria. He reports having a ureteral stent placed last week. He has some hematuria for a couple of days after, but it resolved. He says 3 days ago the hematuria came back and was accompanied by a burning with urination. He denies any fevers, chills, back pain. He reports some nausea, poor appetite, and fatigue. He denies any other alleviating or aggravating factors. He spoke with his urologist who recommended that he come to the ED.   ED Course: CT imaging was obtained that showed bilateral hydronephrosis and colovesicular fistula. Urology was consulted by EDP. Labwork revealed renal failure. [facility] was called for admission.   Review of Systems:  Remainder of 10 point review of systems is otherwise negative for all not mentioned in HPI.   Past Medical History:  Diagnosis Date  . Cervical radiculopathy    with left sided weakness  . Chemical exposure    service related injury  . Chronic pain   . GIB (gastrointestinal bleeding)   . PTSD (post-traumatic stress disorder)   . Renal disorder    CKD  . Urinary retention    due to bladder outlet obstruction    Past Surgical History:  Procedure Laterality Date  . CYSTOSCOPY W/ URETERAL STENT PLACEMENT N/A 11/14/2019   Procedure: CYSTOSCOPY WITH RETROGRADE PYELOGRAM bilateral Wyvonnia Dusky STENT PLACEMENT left fulguration bladder . cystogram;  Surgeon: Raynelle Bring, MD;  Location: [facility];  Service: Urology;  Laterality: N/A;  . GASTRIC BYPASS    . HERNIA REPAIR    . IR NEPHROSTOMY PLACEMENT RIGHT  11/16/2019  . IR SINUS/FIST TUBE CHK-NON GI  12/03/2019  . IR URETERAL STENT PLACEMENT EXISTING ACCESS RIGHT   12/08/2019     reports that he has never smoked. He uses smokeless tobacco. He reports that he does not drink alcohol and does not use drugs.  Allergies  Allergen Reactions  . Nsaids Other (See Comments)    Bleeding GI Ulceration    Family History  Problem Relation Age of Onset  . Renal cancer Father   . Heart disease Paternal Grandfather     Prior to Admission medications   Medication Sig Start Date End Date Taking? Authorizing Provider  cyanocobalamin (,VITAMIN B-12,) 1000 MCG/ML injection Inject 1,000 mcg into the muscle every 30 (thirty) days.  12/28/13  Yes [provider]  docusate sodium (COLACE) 100 MG capsule Take 1 capsule (100 mg total) by mouth 2 (two) times daily as needed for mild constipation. 11/19/19  Yes Antonieta Pert, MD  DULoxetine (CYMBALTA) 30 MG capsule Take 30-60 mg by mouth See admin instructions. Take 2 capsules in the morning and 1 capsule in the evening 08/12/17  Yes [provider]  gabapentin (NEURONTIN) 400 MG capsule Take 800 mg by mouth at bedtime.   Yes [provider]  HYDROcodone-acetaminophen (NORCO/VICODIN) 5-325 MG tablet Take 1-2 tablets by mouth every 4 (four) hours as needed for up to 10 doses for moderate pain or severe pain (1 moderate, 2 severe). 11/19/19  Yes Antonieta Pert, MD  hydrOXYzine (ATARAX/VISTARIL) 10 MG tablet Take 1 tablet (10 mg total) by mouth 3 (three) times daily as needed for itching or anxiety. 10/26/19  Yes  Mikhail, Velta Addison, DO  midodrine (PROAMATINE) 2.5 MG tablet Take 2 tablets (5 mg total) by mouth 3 (three) times daily with meals. Patient taking differently: Take 2.5-5 mg by mouth 3 (three) times daily as needed (hypotension).  11/19/19  Yes Antonieta Pert, MD  Multiple Vitamin (MULTIVITAMIN WITH MINERALS) TABS tablet Take 1 tablet by mouth daily. 10/27/19  Yes Mikhail, Velta Addison, DO  tamsulosin (FLOMAX) 0.4 MG CAPS capsule Take 0.4 mg by mouth daily.    Yes [provider]  feeding supplement, ENSURE  ENLIVE, (ENSURE ENLIVE) LIQD Take 237 mLs by mouth 3 (three) times daily between meals. Patient not taking: Reported on 12/24/2019 10/26/19   Cristal Ford, DO    Physical Exam: Vitals:   12/24/19 0851 12/24/19 0900 12/24/19 1025 12/24/19 1217  BP: 101/72  112/64 114/66  Pulse: 69  67 70  Resp: 20  18 18   Temp: 98 F (36.7 C)     TempSrc: Oral     SpO2: 100% 100% 100% 99%    General: 54 y.o. male resting in bed in NAD, cachetic appearing Eyes: PERRL, normal sclera ENMT: Nares patent w/o discharge, orophaynx clear, dentition normal, ears w/o discharge/lesions/ulcers Neck: Supple, trachea midline Cardiovascular: RRR, +S1, S2, no m/g/r, equal pulses throughout Respiratory: CTABL, no w/r/r, normal WOB GI: BS+, NDNT, no masses noted, no organomegaly noted MSK: No e/c/c Skin: No rashes, bruises, ulcerations noted Neuro: A&O x 3, no focal deficits Psyc: Appropriate interaction and affect, calm/cooperative  Labs on Admission: I have personally reviewed following labs and imaging studies  CBC: Recent Labs  Lab 12/24/19 1023  WBC 7.1  NEUTROABS 5.6  HGB 8.9*  HCT 28.3*  MCV 97.6  PLT 427   Basic Metabolic Panel: Recent Labs  Lab 12/24/19 1023  NA 131*  K 5.5*  CL 108  CO2 7*  GLUCOSE 99  BUN 81*  CREATININE 7.19*  CALCIUM 8.0*   GFR: CrCl cannot be calculated (Unknown ideal weight.). Liver Function Tests: No results for input(s): AST, ALT, ALKPHOS, BILITOT, PROT, ALBUMIN in the last 168 hours. No results for input(s): LIPASE, AMYLASE in the last 168 hours. No results for input(s): AMMONIA in the last 168 hours. Coagulation Profile: No results for input(s): INR, PROTIME in the last 168 hours. Cardiac Enzymes: No results for input(s): CKTOTAL, CKMB, CKMBINDEX, TROPONINI in the last 168 hours. BNP (last 3 results) No results for input(s): PROBNP in the last 8760 hours. HbA1C: No results for input(s): HGBA1C in the last 72 hours. CBG: No results for input(s):  GLUCAP in the last 168 hours. Lipid Profile: No results for input(s): CHOL, HDL, LDLCALC, TRIG, CHOLHDL, LDLDIRECT in the last 72 hours. Thyroid Function Tests: No results for input(s): TSH, T4TOTAL, FREET4, T3FREE, THYROIDAB in the last 72 hours. Anemia Panel: No results for input(s): VITAMINB12, FOLATE, FERRITIN, TIBC, IRON, RETICCTPCT in the last 72 hours. Urine analysis:    Component Value Date/Time   COLORURINE YELLOW 12/24/2019 1033   APPEARANCEUR TURBID (A) 12/24/2019 1033   LABSPEC 1.011 12/24/2019 1033   PHURINE 5.0 12/24/2019 1033   GLUCOSEU NEGATIVE 12/24/2019 1033   HGBUR LARGE (A) 12/24/2019 1033   BILIRUBINUR NEGATIVE 12/24/2019 1033   KETONESUR NEGATIVE 12/24/2019 1033   PROTEINUR 100 (A) 12/24/2019 1033   NITRITE NEGATIVE 12/24/2019 1033   LEUKOCYTESUR LARGE (A) 12/24/2019 1033    Radiological Exams on Admission: CT Renal Stone Study  Addendum Date: 12/24/2019   ADDENDUM REPORT: 12/24/2019 12:35 ADDENDUM: The possibility of a leak in the posterior urinary  bladder in this patient thought to have a fistula was again discussed with the provider. Additionally, the presence of marked bilateral hydronephrosis, worse on the RIGHT also was discussed. Perhaps a cystogram prior to stented exchange if will not significantly delay care could be helpful for further management as clinically warranted. This would avoid the confounding effect of contrast which could be introduced during stent exchange. Urologic consultation was suggested. These results were called by telephone at the time of interpretation on 12/24/2019 at 12:34 pm to provider [facility] , who verbally acknowledged these results. Electronically Signed   By: Zetta Bills M.D.   On: 12/24/2019 12:35   Result Date: 12/24/2019 CLINICAL DATA:  Flank pain, kidney stone suspected EXAM: CT ABDOMEN AND PELVIS WITHOUT CONTRAST TECHNIQUE: Multidetector CT imaging of the abdomen and pelvis was performed following the standard  protocol without IV contrast. COMPARISON:  12/03/2019 FINDINGS: Lower chest: Lung bases are clear. No consolidation. No pleural effusion. Hepatobiliary: Liver grossly normal.  Post cholecystectomy. Pancreas: No peripancreatic stranding or ductal dilation. Spleen: Spleen normal in size and contour. Adrenals/Urinary Tract: Adrenal glands are normal. Moderate to marked bilateral collecting system and renal pelvic distension increased when compared to 12/03/2019. Ureteral stents are in place, proximal loops within the area of the renal pelvis bilaterally, distal loops within the urinary bladder. Gas within the urinary bladder and thickened wall. Perivesical fluid collection with reaccumulation of some fluid following drain removal within the rectoprostatic region measuring 6.6 x 2.3 x 6.2 cm. Foley catheter remains in place. Stomach/Bowel: Post gastric bypass procedure. No significant colonic thickening normal appendix. Vascular/Lymphatic: Aortic atheromatous plaque. No aneurysmal dilation. There is no gastrohepatic or hepatoduodenal ligament lymphadenopathy. No retroperitoneal or mesenteric lymphadenopathy. No pelvic sidewall lymphadenopathy. Reproductive: Fluid collection about the bladder. Prostate not well evaluated. Other: No free air. Postoperative changes associated with prior herniorrhaphy in the LEFT lower quadrant. Musculoskeletal: Signs of prior trauma to the RIGHT and LEFT chest with rib fractures of varying ages. No acute musculoskeletal process or destructive bone finding. IMPRESSION: 1. Reaccumulation of small to moderate volume of fluid above the urinary bladder in this patient with suspected defect in the urinary bladder. No adjacent colonic thickening. Study limited by lack of rectal or vesicle contrast with respect to detection of colovesical fistula though the fat plane appears reasonably well preserved between the collection in the colon on the current study. 2. Interval development of moderate to  marked bilateral hydroureteronephrosis despite presence of bilateral ureteral stents. 3. Aortic atherosclerosis. Call is out to the referring provider to further discuss findings in the above case. Aortic Atherosclerosis (ICD10-I70.0). Electronically Signed: By: Zetta Bills M.D. On: 12/24/2019 12:18   Assessment/Plan AKI on CKD4 Bilateral hydronephrosis Hematuria Hx of ureteral stents     - admit to inpatient     - CT results noted, EDP has contacted urology; they will see in consult; awaiting their final plan for this urological issue     - Scr is ~4.5 pt off baseline; spoke with nephrology, they will see in consult     - let's start D5-bicarb @ 100cc/hr     - watch nephrotoxins     - foley cath has been placed     - continue flomax  Hyperkalemia HAGMA Hyponatremia     - check EKG     - will be receiving bicarb gtt; expect some K+ shift as bicarb is infused and metabolic acidosis is treated     - will give OT dose lokelma and  follow     - follow Na+  Hx of Macrocytic anemia     - check B12, THF     - follow H&H, transfuse as necessary  Anxiety/Depression     - hold cymbalta d/t renal dysfxn  Severe protein-calorie malnutrition     - nutrition consult  Hx of CVA     - left side residuals     - continue outpt follow up  DVT prophylaxis: SCDs  Code Status: FULL  Family Communication: None at bedside  Consults called: EDP called Urology. [facility] consulted Nephrology  Admission status: Inpatient d/t severity of illness.   Status is: Inpatient  Remains inpatient appropriate because:Inpatient level of care appropriate due to severity of illness   Dispo: The patient is from: Home              Anticipated d/c is to: Home              Anticipated d/c date is: 3 days              Patient currently is not medically stable to d/c.  Jonnie Finner DO [facility]  If 7PM-7AM, please contact night-coverage [email or web address]  12/24/2019, 2:41 PM

## 2019-12-24 NOTE — ED Notes (Signed)
Pt reports burning sensation post voiding.

## 2019-12-24 NOTE — ED Provider Notes (Signed)
Chase [name]   CSN: [id number] Arrival date & time: 12/24/19  9629     History Chief Complaint  Patient presents with  . Hematuria    Chase [name] is a 54 y.o. male with a past medical history of prior stroke with left-sided weakness, urinary retention secondary to bladder outlet obstruction currently with Foley catheter in place, ureteral stent placement on 12/08/2019 presenting to the ED with a chief complaint of hematuria.  For the past 3 days has noticed hematuria in his Foley bag.  Been having dysuria for the past 2 days.  He is concerned that he may have a UTI.  Denies any fever, back pain but does endorse nausea.  States that typically when he has hematuria with his Foley catheter it resolved spontaneously.  He called his urologist and was told to come to the ER.  Denies any changes to bowel movements, abdominal pain, shortness of breath, chest pain.  HPI     Past Medical History:  Diagnosis Date  . Cervical radiculopathy    with left sided weakness  . Chemical exposure    service related injury  . Chronic pain   . GIB (gastrointestinal bleeding)   . PTSD (post-traumatic stress disorder)   . Renal disorder    CKD  . Urinary retention    due to bladder outlet obstruction    Patient Active Problem List   Diagnosis Date Noted  . 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 Pelvic abscess with colovescial fistula 11/15/2019  . E. coli UTI (urinary tract infection) 11/15/2019  . PTSD (post-traumatic stress disorder)   . Chemical exposure   . Cervical radiculopathy   . Septic shock ([place]) 11/08/2019  . Protein-calorie malnutrition, severe 10/23/2019  . Cerebral embolism with cerebral infarction 10/18/2019  . Acute left-sided weakness 10/17/2019  . CKD (chronic kidney disease), stage IV ([place]) 10/17/2019  . Chronic pain 10/17/2019  . Fall 10/17/2019  . Pseudomonas urinary tract infection 08/24/2019  . BPH (benign prostatic  hyperplasia) 08/24/2019  . Acute on chronic renal failure ([place]) 08/24/2019  . Anemia 08/24/2019  . Dyspnea 08/24/2019  . History of COVID-19 08/24/2019  . History of elevated PSA 08/24/2019  . 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 Hyponatremia 08/24/2019  . Metabolic acidosis 52/84/1324  . Skin lesion of neck 08/24/2019  . Toxic metabolic encephalopathy 40/12/2723  . Cervical spondylosis without myelopathy 06/19/2016  . Neck pain 09/04/2015  . History of stomach ulcers 05/19/2013  . Male erectile dysfunction 05/19/2013  . Hodgkin lymphoma, unspecified, unspecified site ([place]) 12/10/2012  . Iron deficiency anemia 12/10/2012  . Pernicious anemia 12/10/2012  . Arthralgia of multiple sites 04/20/2012  . Postlaminectomy syndrome, not elsewhere classified 04/20/2012    Past Surgical History:  Procedure Laterality Date  . CYSTOSCOPY W/ URETERAL STENT PLACEMENT N/A 11/14/2019   Procedure: CYSTOSCOPY WITH RETROGRADE PYELOGRAM bilateral Wyvonnia Dusky STENT PLACEMENT left fulguration bladder . cystogram;  Surgeon: Raynelle Bring, MD;  Location: [facility];  Service: Urology;  Laterality: N/A;  . GASTRIC BYPASS    . HERNIA REPAIR    . IR NEPHROSTOMY PLACEMENT RIGHT  11/16/2019  . IR SINUS/FIST TUBE CHK-NON GI  12/03/2019  . IR URETERAL STENT PLACEMENT EXISTING ACCESS RIGHT  12/08/2019       Family History  Problem Relation Age of Onset  . Renal cancer Father   . Heart disease Paternal Grandfather     Social History   Tobacco Use  . Smoking status: Never Smoker  .  Smokeless tobacco: Current User  Vaping Use  . Vaping Use: Never used  Substance Use Topics  . Alcohol use: Never  . Drug use: Never    Home Medications Prior to Admission medications   Medication Sig Start Date End Date Taking? Authorizing Provider  cyanocobalamin (,VITAMIN B-12,) 1000 MCG/ML injection Inject 1,000 mcg into the muscle every 30 (thirty) days.  12/28/13  Yes [provider]  docusate sodium (COLACE)  100 MG capsule Take 1 capsule (100 mg total) by mouth 2 (two) times daily as needed for mild constipation. 11/19/19  Yes Antonieta Pert, MD  DULoxetine (CYMBALTA) 30 MG capsule Take 30-60 mg by mouth See admin instructions. Take 2 capsules in the morning and 1 capsule in the evening 08/12/17  Yes [provider]  gabapentin (NEURONTIN) 400 MG capsule Take 800 mg by mouth at bedtime.   Yes [provider]  HYDROcodone-acetaminophen (NORCO/VICODIN) 5-325 MG tablet Take 1-2 tablets by mouth every 4 (four) hours as needed for up to 10 doses for moderate pain or severe pain (1 moderate, 2 severe). 11/19/19  Yes Antonieta Pert, MD  hydrOXYzine (ATARAX/VISTARIL) 10 MG tablet Take 1 tablet (10 mg total) by mouth 3 (three) times daily as needed for itching or anxiety. 10/26/19  Yes Mikhail, Maryann, DO  midodrine (PROAMATINE) 2.5 MG tablet Take 2 tablets (5 mg total) by mouth 3 (three) times daily with meals. Patient taking differently: Take 2.5-5 mg by mouth 3 (three) times daily as needed (hypotension).  11/19/19  Yes Antonieta Pert, MD  Multiple Vitamin (MULTIVITAMIN WITH MINERALS) TABS tablet Take 1 tablet by mouth daily. 10/27/19  Yes Mikhail, Velta Addison, DO  tamsulosin (FLOMAX) 0.4 MG CAPS capsule Take 0.4 mg by mouth daily.    Yes [provider]  feeding supplement, ENSURE ENLIVE, (ENSURE ENLIVE) LIQD Take 237 mLs by mouth 3 (three) times daily between meals. Patient not taking: Reported on 12/24/2019 10/26/19   Cristal Ford, DO    Allergies    Nsaids  Review of Systems   Review of Systems  Constitutional: Negative for appetite change, chills and fever.  HENT: Negative for ear pain, rhinorrhea, sneezing and sore throat.   Eyes: Negative for photophobia and visual disturbance.  Respiratory: Negative for cough, chest tightness, shortness of breath and wheezing.   Cardiovascular: Negative for chest pain and palpitations.  Gastrointestinal: Negative for abdominal pain, blood in stool,  constipation, diarrhea, nausea and vomiting.  Genitourinary: Positive for dysuria and hematuria. Negative for urgency.  Musculoskeletal: Negative for myalgias.  Skin: Negative for rash.  Neurological: Negative for dizziness, weakness and light-headedness.    Physical Exam Updated Vital Signs BP 114/66   Pulse 70   Temp 98 F (36.7 C) (Oral)   Resp 18   SpO2 99%   Physical Exam Vitals and nursing [name] reviewed.  Constitutional:      General: He is not in acute distress.    Appearance: He is well-developed. He is cachectic.  HENT:     Head: Normocephalic and atraumatic.     Nose: Nose normal.  Eyes:     General: No scleral icterus.       Right eye: No discharge.        Left eye: No discharge.     Conjunctiva/sclera: Conjunctivae normal.  Cardiovascular:     Rate and Rhythm: Normal rate and regular rhythm.     Heart sounds: Normal heart sounds. No murmur heard.  No friction rub. No gallop.   Pulmonary:  Effort: Pulmonary effort is normal. No respiratory distress.     Breath sounds: Normal breath sounds.  Abdominal:     General: Bowel sounds are normal. There is no distension.     Palpations: Abdomen is soft.     Tenderness: There is no abdominal tenderness. There is no guarding.  Genitourinary:    Comments: Foley in place. Musculoskeletal:        General: Normal range of motion.     Cervical back: Normal range of motion and neck supple.  Skin:    General: Skin is warm and dry.     Findings: No rash.  Neurological:     Mental Status: He is alert.     Motor: No abnormal muscle tone.     Coordination: Coordination normal.     ED Results / Procedures / Treatments   Labs (all labs ordered are listed, but only abnormal results are displayed) Labs Reviewed  BASIC METABOLIC PANEL - Abnormal; Notable for the following components:      Result Value   Sodium 131 (*)    Potassium 5.5 (*)    CO2 7 (*)    BUN 81 (*)    Creatinine, Ser 7.19 (*)    Calcium 8.0 (*)     GFR calc non Af Amer 8 (*)    GFR calc Af Amer 9 (*)    Anion gap 16 (*)    All other components within normal limits  CBC WITH DIFFERENTIAL/PLATELET - Abnormal; Notable for the following components:   RBC 2.90 (*)    Hemoglobin 8.9 (*)    HCT 28.3 (*)    RDW 18.3 (*)    All other components within normal limits  URINALYSIS, ROUTINE W REFLEX MICROSCOPIC - Abnormal; Notable for the following components:   APPearance TURBID (*)    Hgb urine dipstick LARGE (*)    Protein, ur 100 (*)    Leukocytes,Ua LARGE (*)    RBC / HPF >50 (*)    WBC, UA >50 (*)    Bacteria, UA MANY (*)    All other components within normal limits  URINE CULTURE  RESPIRATORY PANEL BY RT PCR (FLU A&B, COVID)    EKG None  Radiology CT Renal Stone Study  Addendum Date: 12/24/2019   ADDENDUM REPORT: 12/24/2019 12:35 ADDENDUM: The possibility of a leak in the posterior urinary bladder in this patient thought to have a fistula was again discussed with the provider. Additionally, the presence of marked bilateral hydronephrosis, worse on the RIGHT also was discussed. Perhaps a cystogram prior to stented exchange if will not significantly delay care could be helpful for further management as clinically warranted. This would avoid the confounding effect of contrast which could be introduced during stent exchange. Urologic consultation was suggested. These results were called by telephone at the time of interpretation on 12/24/2019 at 12:34 pm to provider [facility] , who verbally acknowledged these results. Electronically Signed   By: Zetta Bills M.D.   On: 12/24/2019 12:35   Result Date: 12/24/2019 CLINICAL DATA:  Flank pain, kidney stone suspected EXAM: CT ABDOMEN AND PELVIS WITHOUT CONTRAST TECHNIQUE: Multidetector CT imaging of the abdomen and pelvis was performed following the standard protocol without IV contrast. COMPARISON:  12/03/2019 FINDINGS: Lower chest: Lung bases are clear. No consolidation. No pleural  effusion. Hepatobiliary: Liver grossly normal.  Post cholecystectomy. Pancreas: No peripancreatic stranding or ductal dilation. Spleen: Spleen normal in size and contour. Adrenals/Urinary Tract: Adrenal glands are normal. Moderate to marked  bilateral collecting system and renal pelvic distension increased when compared to 12/03/2019. Ureteral stents are in place, proximal loops within the area of the renal pelvis bilaterally, distal loops within the urinary bladder. Gas within the urinary bladder and thickened wall. Perivesical fluid collection with reaccumulation of some fluid following drain removal within the rectoprostatic region measuring 6.6 x 2.3 x 6.2 cm. Foley catheter remains in place. Stomach/Bowel: Post gastric bypass procedure. No significant colonic thickening normal appendix. Vascular/Lymphatic: Aortic atheromatous plaque. No aneurysmal dilation. There is no gastrohepatic or hepatoduodenal ligament lymphadenopathy. No retroperitoneal or mesenteric lymphadenopathy. No pelvic sidewall lymphadenopathy. Reproductive: Fluid collection about the bladder. Prostate not well evaluated. Other: No free air. Postoperative changes associated with prior herniorrhaphy in the LEFT lower quadrant. Musculoskeletal: Signs of prior trauma to the RIGHT and LEFT chest with rib fractures of varying ages. No acute musculoskeletal process or destructive bone finding. IMPRESSION: 1. Reaccumulation of small to moderate volume of fluid above the urinary bladder in this patient with suspected defect in the urinary bladder. No adjacent colonic thickening. Study limited by lack of rectal or vesicle contrast with respect to detection of colovesical fistula though the fat plane appears reasonably well preserved between the collection in the colon on the current study. 2. Interval development of moderate to marked bilateral hydroureteronephrosis despite presence of bilateral ureteral stents. 3. Aortic atherosclerosis. Call is out to  the referring provider to further discuss findings in the above case. Aortic Atherosclerosis (ICD10-I70.0). Electronically Signed: By: Zetta Bills M.D. On: 12/24/2019 12:18    Procedures .Critical Care Performed by: Delia Heady, PA-C Authorized by: Delia Heady, PA-C   Critical care provider statement:    Critical care time (minutes):  45   Critical care was necessary to treat or prevent imminent or life-threatening deterioration of the following conditions:  Renal failure, sepsis and shock   Critical care was time spent personally by me on the following activities:  Development of treatment plan with patient or surrogate, discussions with consultants, evaluation of patient's response to treatment, examination of patient, obtaining history from patient or surrogate, ordering and performing treatments and interventions, ordering and review of laboratory studies, ordering and review of radiographic studies, re-evaluation of patient's condition and review of old charts   I assumed direction of critical care for this patient from another provider in my specialty: no     (including critical care time)  Medications Ordered in ED Medications - No data to display  ED Course  I have reviewed the triage vital signs and the nursing notes.  Pertinent labs & imaging results that were available during my care of the patient were reviewed by me and considered in my medical decision making (see chart for details).  Clinical Course as of Dec 23 1340  Fri Dec 24, 2019  0919 Baseline around 2.5.  Creatinine(!): 7.19 [HK]    Clinical Course User Index [HK] Delia Heady, PA-C   MDM Rules/Calculators/A&P                          54 year old male with past medical history of prior stroke with left-sided weakness, urinary retention secondary to bladder outlet obstruction currently with Foley catheter, ureteral stent placement earlier in the month, CKD presenting to the complaint of hematuria.  Reports  hematuria and pressure in his bladder area for the past 2 to 3 days.  He is afebrile here.  Work-up significant for creatinine of 7 which is elevated from his  baseline of 2.5.  CBC with hemoglobin at baseline at 8.9.  No leukocytosis.  Urinalysis with many bacteria, leukocytes, greater than 50 RBCs and WBCs.  CT renal stone study shows possible colovesical fistula which was apparent in prior imaging as well although there is possibility of a leak in the posterior bladder as well as reaccumulation of fluid above the bladder.  I informed urology, Dr. Gloriann Loan about this and he will see the patient in consult.  Patient will also need to be admitted for his AKI in the setting of these CT findings.  Patient is agreeable to the plan.  He remains hemodynamically stable here.  All imaging, if done today, including plain films, CT scans, and ultrasounds, independently reviewed by me, and interpretations confirmed via formal radiology reads.  Portions of this [name] were generated with [employer]. Dictation errors may occur despite best attempts at proofreading.  Final Clinical Impression(s) / ED Diagnoses Final diagnoses:  Lower urinary tract infectious disease  Hemorrhagic cystitis  AKI (acute kidney injury) ([place])  Hydronephrosis, unspecified hydronephrosis type    Rx / DC Orders ED Discharge Orders    None       Delia Heady, PA-C 12/24/19 1345    Dorie Rank, MD 12/24/19 1605

## 2019-12-25 ENCOUNTER — Inpatient Hospital Stay (HOSPITAL_COMMUNITY): Payer: No Typology Code available for payment source

## 2019-12-25 ENCOUNTER — Encounter (HOSPITAL_COMMUNITY)
Admission: EM | Disposition: A | Discharge status: Skilled Nursing Facility | Payer: Self-pay | Source: Home / Self Care | Attending: Internal Medicine

## 2019-12-25 ENCOUNTER — Inpatient Hospital Stay (HOSPITAL_COMMUNITY): Payer: No Typology Code available for payment source | Admitting: Anesthesiology

## 2019-12-25 ENCOUNTER — Encounter (HOSPITAL_COMMUNITY): Payer: Self-pay | Admitting: Internal Medicine

## 2019-12-25 DIAGNOSIS — E872 Acidosis: Secondary | ICD-10-CM

## 2019-12-25 DIAGNOSIS — N390 Urinary tract infection, site not specified: Secondary | ICD-10-CM

## 2019-12-25 DIAGNOSIS — N179 Acute kidney failure, unspecified: Secondary | ICD-10-CM

## 2019-12-25 DIAGNOSIS — N189 Chronic kidney disease, unspecified: Secondary | ICD-10-CM

## 2019-12-25 DIAGNOSIS — N1330 Unspecified hydronephrosis: Secondary | ICD-10-CM

## 2019-12-25 HISTORY — PX: CYSTOSCOPY W/ URETERAL STENT PLACEMENT: SHX1429

## 2019-12-25 LAB — COMPREHENSIVE METABOLIC PANEL
ALT: 76 U/L — ABNORMAL HIGH (ref 0–44)
AST: 44 U/L — ABNORMAL HIGH (ref 15–41)
Albumin: 2.7 g/dL — ABNORMAL LOW (ref 3.5–5.0)
Alkaline Phosphatase: 122 U/L (ref 38–126)
Anion gap: 18 — ABNORMAL HIGH (ref 5–15)
BUN: 86 mg/dL — ABNORMAL HIGH (ref 6–20)
CO2: 8 mmol/L — ABNORMAL LOW (ref 22–32)
Calcium: 7.8 mg/dL — ABNORMAL LOW (ref 8.9–10.3)
Chloride: 106 mmol/L (ref 98–111)
Creatinine, Ser: 7.65 mg/dL — ABNORMAL HIGH (ref 0.61–1.24)
GFR calc Af Amer: 8 mL/min — ABNORMAL LOW (ref >60)
GFR calc non Af Amer: 7 mL/min — ABNORMAL LOW (ref >60)
Glucose, Bld: 115 mg/dL — ABNORMAL HIGH (ref 70–99)
Potassium: 4.7 mmol/L (ref 3.5–5.1)
Sodium: 132 mmol/L — ABNORMAL LOW (ref 135–145)
Total Bilirubin: 0.3 mg/dL (ref 0.3–1.2)
Total Protein: 6.7 g/dL (ref 6.5–8.1)

## 2019-12-25 LAB — BASIC METABOLIC PANEL
Anion gap: 15 (ref 5–15)
BUN: 91 mg/dL — ABNORMAL HIGH (ref 6–20)
CO2: 14 mmol/L — ABNORMAL LOW (ref 22–32)
Calcium: 6.7 mg/dL — ABNORMAL LOW (ref 8.9–10.3)
Chloride: 107 mmol/L (ref 98–111)
Creatinine, Ser: 7.55 mg/dL — ABNORMAL HIGH (ref 0.61–1.24)
GFR calc Af Amer: 8 mL/min — ABNORMAL LOW (ref >60)
GFR calc non Af Amer: 7 mL/min — ABNORMAL LOW (ref >60)
Glucose, Bld: 155 mg/dL — ABNORMAL HIGH (ref 70–99)
Potassium: 3.5 mmol/L (ref 3.5–5.1)
Sodium: 136 mmol/L (ref 135–145)

## 2019-12-25 LAB — CBC
HCT: 27.5 % — ABNORMAL LOW (ref 39.0–52.0)
Hemoglobin: 8.6 g/dL — ABNORMAL LOW (ref 13.0–17.0)
MCH: 31.2 pg (ref 26.0–34.0)
MCHC: 31.3 g/dL (ref 30.0–36.0)
MCV: 99.6 fL (ref 80.0–100.0)
Platelets: 231 10*3/uL (ref 150–400)
RBC: 2.76 MIL/uL — ABNORMAL LOW (ref 4.22–5.81)
RDW: 18.5 % — ABNORMAL HIGH (ref 11.5–15.5)
WBC: 6 10*3/uL (ref 4.0–10.5)
nRBC: 0 % (ref 0.0–0.2)

## 2019-12-25 SURGERY — CYSTOSCOPY, WITH RETROGRADE PYELOGRAM AND URETERAL STENT INSERTION
Anesthesia: General | Site: Ureter | Laterality: Bilateral

## 2019-12-25 MED ORDER — HYDROXYZINE HCL 10 MG PO TABS
10.0000 mg | ORAL_TABLET | Freq: Three times a day (TID) | ORAL | Status: DC | PRN
  Administered 2019-12-25 – 2020-01-08 (×34): 10 mg via ORAL
  Filled 2019-12-25 (×35): qty 1

## 2019-12-25 MED ORDER — PHENYLEPHRINE 40 MCG/ML (10ML) SYRINGE FOR IV PUSH (FOR BLOOD PRESSURE SUPPORT)
PREFILLED_SYRINGE | INTRAVENOUS | Status: DC | PRN
  Administered 2019-12-25: 160 ug via INTRAVENOUS
  Administered 2019-12-25: 80 ug via INTRAVENOUS
  Administered 2019-12-25: 120 ug via INTRAVENOUS

## 2019-12-25 MED ORDER — SODIUM CHLORIDE 0.9 % IV BOLUS
2000.0000 mL | Freq: Once | INTRAVENOUS | Status: AC
  Administered 2019-12-25: 2000 mL via INTRAVENOUS

## 2019-12-25 MED ORDER — PROPOFOL 10 MG/ML IV BOLUS
INTRAVENOUS | Status: DC | PRN
  Administered 2019-12-25: 140 mg via INTRAVENOUS

## 2019-12-25 MED ORDER — CEFAZOLIN SODIUM-DEXTROSE 2-3 GM-%(50ML) IV SOLR
INTRAVENOUS | Status: DC | PRN
  Administered 2019-12-25: 2 g via INTRAVENOUS

## 2019-12-25 MED ORDER — EPHEDRINE 5 MG/ML INJ
INTRAVENOUS | Status: AC
  Filled 2019-12-25: qty 10

## 2019-12-25 MED ORDER — JUVEN PO PACK
1.0000 | PACK | Freq: Two times a day (BID) | ORAL | Status: DC
  Administered 2019-12-26: 1 via ORAL
  Filled 2019-12-25 (×14): qty 1

## 2019-12-25 MED ORDER — FENTANYL CITRATE (PF) 100 MCG/2ML IJ SOLN
INTRAMUSCULAR | Status: DC | PRN
Start: 2019-12-25 — End: 2019-12-25
  Administered 2019-12-25: 100 ug via INTRAVENOUS

## 2019-12-25 MED ORDER — ONDANSETRON HCL 4 MG/2ML IJ SOLN
INTRAMUSCULAR | Status: DC | PRN
  Administered 2019-12-25: 4 mg via INTRAVENOUS

## 2019-12-25 MED ORDER — FENTANYL CITRATE (PF) 100 MCG/2ML IJ SOLN: 25.0000 ug | INTRAMUSCULAR | Status: DC | PRN

## 2019-12-25 MED ORDER — PIPERACILLIN-TAZOBACTAM IN DEX 2-0.25 GM/50ML IV SOLN
2.2500 g | Freq: Three times a day (TID) | INTRAVENOUS | Status: DC
  Administered 2019-12-25 – 2019-12-27 (×6): 2.25 g via INTRAVENOUS
  Filled 2019-12-25 (×7): qty 50

## 2019-12-25 MED ORDER — CEFAZOLIN SODIUM-DEXTROSE 2-4 GM/100ML-% IV SOLN
INTRAVENOUS | Status: AC
  Filled 2019-12-25: qty 100

## 2019-12-25 MED ORDER — MIDAZOLAM HCL 2 MG/2ML IJ SOLN
INTRAMUSCULAR | Status: AC
  Filled 2019-12-25: qty 2

## 2019-12-25 MED ORDER — CHLORHEXIDINE GLUCONATE CLOTH 2 % EX PADS
6.0000 | MEDICATED_PAD | Freq: Every day | CUTANEOUS | Status: DC
  Administered 2019-12-25 – 2020-01-08 (×14): 6 via TOPICAL

## 2019-12-25 MED ORDER — EPHEDRINE SULFATE-NACL 50-0.9 MG/10ML-% IV SOSY
PREFILLED_SYRINGE | INTRAVENOUS | Status: DC | PRN
  Administered 2019-12-25: 15 mg via INTRAVENOUS
  Administered 2019-12-25 (×2): 20 mg via INTRAVENOUS
  Administered 2019-12-25: 10 mg via INTRAVENOUS

## 2019-12-25 MED ORDER — PROPOFOL 10 MG/ML IV BOLUS
INTRAVENOUS | Status: AC
  Filled 2019-12-25: qty 20

## 2019-12-25 MED ORDER — SODIUM CHLORIDE 0.9 % IR SOLN
Status: DC | PRN
  Administered 2019-12-25: 3000 mL

## 2019-12-25 MED ORDER — PROMETHAZINE HCL 25 MG/ML IJ SOLN: 6.2500 mg | INTRAMUSCULAR | Status: DC | PRN

## 2019-12-25 MED ORDER — LIDOCAINE HCL (CARDIAC) PF 100 MG/5ML IV SOSY
PREFILLED_SYRINGE | INTRAVENOUS | Status: DC | PRN
  Administered 2019-12-25: 80 mg via INTRAVENOUS

## 2019-12-25 MED ORDER — 0.9 % SODIUM CHLORIDE (POUR BTL) OPTIME
TOPICAL | Status: DC | PRN
  Administered 2019-12-25: 1000 mL

## 2019-12-25 MED ORDER — SODIUM CHLORIDE 0.9 % IV SOLN: INTRAVENOUS | Status: DC | PRN

## 2019-12-25 MED ORDER — IOHEXOL 300 MG/ML  SOLN
INTRAMUSCULAR | Status: DC | PRN
  Administered 2019-12-25: 41 mL

## 2019-12-25 MED ORDER — ONDANSETRON HCL 4 MG/2ML IJ SOLN
INTRAMUSCULAR | Status: AC
  Filled 2019-12-25: qty 2

## 2019-12-25 MED ORDER — FENTANYL CITRATE (PF) 100 MCG/2ML IJ SOLN
INTRAMUSCULAR | Status: AC
  Filled 2019-12-25: qty 2

## 2019-12-25 MED ORDER — MEPERIDINE HCL 50 MG/ML IJ SOLN: 6.2500 mg | INTRAMUSCULAR | Status: DC | PRN

## 2019-12-25 SURGICAL SUPPLY — 19 items
BAG DRN RND TRDRP ANRFLXCHMBR (UROLOGICAL SUPPLIES) ×1
BAG URINE DRAIN 2000ML AR STRL (UROLOGICAL SUPPLIES) ×1 IMPLANT
BAG URO CATCHER STRL LF (MISCELLANEOUS) ×2 IMPLANT
CATH FOLEY 2WAY SLVR  5CC 20FR (CATHETERS) ×2
CATH FOLEY 2WAY SLVR 5CC 20FR (CATHETERS) IMPLANT
CATH INTERMIT  6FR 70CM (CATHETERS) ×2 IMPLANT
CATH URET DUAL LUMEN 6-10FR 50 (CATHETERS) ×1 IMPLANT
CLOTH BEACON ORANGE TIMEOUT ST (SAFETY) ×2 IMPLANT
GLOVE BIO SURGEON STRL SZ7.5 (GLOVE) ×2 IMPLANT
GOWN STRL REUS W/TWL LRG LVL3 (GOWN DISPOSABLE) ×4 IMPLANT
GOWN STRL REUS W/TWL XL LVL3 (GOWN DISPOSABLE) ×2 IMPLANT
GUIDEWIRE ANG ZIPWIRE 038X150 (WIRE) ×1 IMPLANT
GUIDEWIRE STR DUAL SENSOR (WIRE) ×2 IMPLANT
KIT TURNOVER KIT A (KITS) IMPLANT
MANIFOLD NEPTUNE II (INSTRUMENTS) ×2 IMPLANT
PACK CYSTO (CUSTOM PROCEDURE TRAY) ×2 IMPLANT
STENT CONTOUR NO GW 8FR 26CM (STENTS) ×1 IMPLANT
TUBING CONNECTING 10 (TUBING) ×2 IMPLANT
TUBING UROLOGY SET (TUBING) IMPLANT

## 2019-12-25 NOTE — Progress Notes (Signed)
Initial Nutrition Assessment  DOCUMENTATION CODES:   Not applicable  INTERVENTION:  Continue Ensure Enlive po TID, each supplement provides 350 kcal and 20 grams of protein  MVI with minerals  Juven BID, each packet provides 95 calories, 2.5 grams of protein (collagen), and 9.8 grams of carbohydrate (3 grams sugar); also contains 7 grams of L-arginine and L-glutamine, 300 mg vitamin C, 15 mg vitamin E, 1.2 mcg vitamin B-12, 9.5 mg zinc, 200 mg calcium, and 1.5 g  Calcium Beta-hydroxy-Beta-methylbutyrate to support wound healing  NUTRITION DIAGNOSIS:   Inadequate oral intake related to acute illness (bilateral hydronephrosis) as evidenced by meal completion < 25%.    GOAL:   Patient will meet greater than or equal to 90% of their needs    MONITOR:   Labs, I & O's, Diet advancement, Supplement acceptance, PO intake, Weight trends  REASON FOR ASSESSMENT:   Consult Assessment of nutrition requirement/status  ASSESSMENT:  RD working remotely.  54 year old male with history of bladder outlet obstruction s/p ureteral stents on 9/08, cervical radiculopathy with left sided weakness, chronic pain, PTSD, CKD, and gastric bypass presented with 3 days of worsening hematuria, nausea, poor appetite, and fatigue.  Patient admitted with bilateral hydronephrosis  RD attempted to contact pt via phone, however pt did not pick up unable to obtain nutrition history at this time. Renal diet without fluid restriction per nephrology. He consumed 20% of dinner meal last night per flowsheets and ordered Ensure supplement TID. Will continue this and order Juven to support wound healing, noted stage II to mid coccyx poa.   Per chart, weights have trended down ~10 lbs (7.6%) over the last 5 months; insignificant 4/12-  58.2 kg (128.04 lb)  6/15-  55.2 kg (121.44 lb)  7/18-  55.3 kg (121.66 lb)  9/09-  54.4 kg (119.68 lb) 9/24-  53.8 kg (118.36 lb)  CT showed severe bilateral hydronephrosis  despite good positioning of stents, Foley in good position, possible colovesical fistula. Urology recommending bilateral nephrostomy tube placement if renal function does not improve.  I/Os: -312 ml since admit UOP: 1250 ml x 24 hrs  Medications reviewed and include: MVI Sodium bicarbonate 150 mEq in D5 @ 150 ml/hr  Labs: Na 132 (L), BUN 86 (H), Cr 7.65 (H), Corrected Ca 8.84 (L), Hgb 8.6 (L) trending down, HCT 27.5 (L) trending down, K 4.7 (WNL)   NUTRITION - FOCUSED PHYSICAL EXAM: Unable to complete at this time, RD working remotely.    Diet Order:   Diet Order            Diet NPO time specified  Diet effective now                 EDUCATION NEEDS:   No education needs have been identified at this time  Skin:  Skin Assessment: Skin Integrity Issues: Skin Integrity Issues:: Stage II Stage II: mid coccyx (11/08/19)  Last BM:  9/24  Height:   Ht Readings from Last 1 Encounters:  12/09/19 5\' 3"  (1.6 m)    Weight:   Wt Readings from Last 1 Encounters:  12/24/19 53.8 kg    Ideal Body Weight:  56.4 kg  BMI:  Body mass index is 21.01 kg/m.  Estimated Nutritional Needs:   Kcal:  1500-1700  Protein:  75-85  Fluid:  >/= 1.5 L/day   Lajuan Lines, RD, LDN Clinical Nutrition After Hours/Weekend Pager # in [phone]

## 2019-12-25 NOTE — NC FL2 (Signed)
[place] LEVEL OF CARE SCREENING TOOL     IDENTIFICATION  Patient Name: Chase [name] Birthdate: 1965/10/21 Sex: male Admission Date (Current Location): 12/24/2019  [facility] and [place] Number:  Herbalist and Address:  [facility],  [place] [address], [place]      Provider Number: [id number]  Attending Physician Name and Address:  Modena Jansky, MD  Relative Name and Phone Number:       Current Level of Care: Hospital Recommended Level of Care: [place] Prior Approval Number:    Date Approved/Denied:   PASRR Number: [id number]  Discharge Plan: SNF    Current Diagnoses: Patient Active Problem List   Diagnosis Date Noted  . AKI (acute kidney injury) ([place]) 12/24/2019  . 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 Pelvic abscess with colovescial fistula 11/15/2019  . E. coli UTI (urinary tract infection) 11/15/2019  . PTSD (post-traumatic stress disorder)   . Chemical exposure   . Cervical radiculopathy   . Septic shock ([place]) 11/08/2019  . Protein-calorie malnutrition, severe 10/23/2019  . Cerebral embolism with cerebral infarction 10/18/2019  . Acute left-sided weakness 10/17/2019  . CKD (chronic kidney disease), stage IV ([place]) 10/17/2019  . Chronic pain 10/17/2019  . Fall 10/17/2019  . Pseudomonas urinary tract infection 08/24/2019  . BPH (benign prostatic hyperplasia) 08/24/2019  . Acute on chronic renal failure ([place]) 08/24/2019  . Anemia 08/24/2019  . Dyspnea 08/24/2019  . History of COVID-19 08/24/2019  . History of elevated PSA 08/24/2019  . 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 Hyponatremia 08/24/2019  . Metabolic acidosis 30/86/5784  . Skin lesion of neck 08/24/2019  . Toxic metabolic encephalopathy 69/62/9528  . Cervical spondylosis without myelopathy 06/19/2016  . Neck pain 09/04/2015  . History of stomach ulcers 05/19/2013  . Male erectile dysfunction 05/19/2013  . Hodgkin lymphoma,  unspecified, unspecified site ([place]) 12/10/2012  . Iron deficiency anemia 12/10/2012  . Pernicious anemia 12/10/2012  . Arthralgia of multiple sites 04/20/2012  . Postlaminectomy syndrome, not elsewhere classified 04/20/2012    Orientation RESPIRATION BLADDER Height & Weight     Self, Time, Situation, Place  Normal Continent Weight: 118 lb 9.6 oz (53.8 kg) Height:     BEHAVIORAL SYMPTOMS/MOOD NEUROLOGICAL BOWEL NUTRITION STATUS      Continent Diet (see dc summary)  AMBULATORY STATUS COMMUNICATION OF NEEDS Skin   Extensive Assist Verbally PU Stage and Appropriate Care (Coccyx)   PU Stage 2 Dressing:  (see dc summary)                   Personal Care Assistance Level of Assistance  Bathing, Feeding, Dressing Bathing Assistance: Maximum assistance Feeding assistance: Limited assistance Dressing Assistance: Maximum assistance     Functional Limitations Info  Sight, Hearing, Speech Sight Info: Adequate Hearing Info: Adequate Speech Info: Adequate    SPECIAL CARE FACTORS FREQUENCY  PT (By licensed PT), OT (By licensed OT)     PT Frequency: 5x week OT Frequency: 5x week            Contractures Contractures Info: Not present    Additional Factors Info  Code Status, Allergies Code Status Info: Full Allergies Info: Nsaids           Current Medications (12/25/2019):  This is the current hospital active medication list Current Facility-Administered Medications  Medication Dose Route Frequency Provider Last Rate Last Admin  . acetaminophen (TYLENOL) tablet 650 mg  650 mg Oral Q6H PRN Cherylann Ratel  A, DO       Or  . acetaminophen (TYLENOL) suppository 650 mg  650 mg Rectal Q6H PRN Marylyn Ishihara, Tyrone A, DO      . Chlorhexidine Gluconate Cloth 2 % PADS 6 each  6 each Topical Daily Marton Redwood III, MD      . feeding supplement (ENSURE ENLIVE) (ENSURE ENLIVE) liquid 237 mL  237 mL Oral TID BM Kyle, Tyrone A, DO   237 mL at 12/24/19 1946  . HYDROcodone-acetaminophen  (NORCO/VICODIN) 5-325 MG per tablet 1 tablet  1 tablet Oral Q4H PRN Marylyn Ishihara, Tyrone A, DO   1 tablet at 12/25/19 1052  . multivitamin with minerals tablet 1 tablet  1 tablet Oral Daily Kyle, Tyrone A, DO   1 tablet at 12/24/19 1946  . nicotine (NICODERM CQ - dosed in mg/24 hours) patch 14 mg  14 mg Transdermal Daily Kyle, Tyrone A, DO      . nutrition supplement (JUVEN) (JUVEN) powder packet 1 packet  1 packet Oral BID BM Hongalgi, Anand D, MD      . sodium bicarbonate 150 mEq in dextrose 5 % 1,000 mL infusion   Intravenous Continuous Roney Jaffe, MD 100 mL/hr at 12/25/19 0512 New Bag at 12/25/19 4970  . tamsulosin (FLOMAX) capsule 0.4 mg  0.4 mg Oral Daily Cherylann Ratel A, DO         Discharge Medications: Please see discharge summary for a list of discharge medications.  Relevant Imaging Results:  Relevant Lab Results:   Additional Information SSN: [id number] [place]

## 2019-12-25 NOTE — Anesthesia Postprocedure Evaluation (Signed)
Anesthesia Post Note  Patient: Chase [name]  Procedure(s) Performed: CYSTOSCOPY WITH bilateral  RETROGRADE PYELOGRAM/ leftURETERAL STENT PLACEMENT (Bilateral Ureter)     Patient location during evaluation: PACU Anesthesia Type: General Level of consciousness: sedated Pain management: pain level controlled Vital Signs Assessment: post-procedure vital signs reviewed and stable Respiratory status: spontaneous breathing Cardiovascular status: stable Postop Assessment: no apparent nausea or vomiting Anesthetic complications: no   No complications documented.  Last Vitals:  Vitals:   12/25/19 1300 12/25/19 1315  BP: 105/60   Pulse:  72  Resp: 12 11  Temp: (!) 36.4 C   SpO2:  100%    Last Pain:  Vitals:   12/25/19 1300  TempSrc:   PainSc: 0-No pain                 Huston Foley

## 2019-12-25 NOTE — TOC Initial Note (Signed)
Transition of Care [facility]) - Initial/Assessment Note    Patient Details  Name: Chase [name] MRN: [id number] Date of Birth: 04/06/1965  Transition of Care [facility]) CM/SW Contact:    Shade Flood, LCSW Phone Number: 12/25/2019, 10:54 AM  Clinical Narrative:                  Pt admitted from [facility]. Anticipating return to facility at dc. TOC will follow and assist as needed.  Expected Discharge Plan: Skilled Nursing Facility Barriers to Discharge: Continued Medical Work up   Patient Goals and CMS Choice        Expected Discharge Plan and Services Expected Discharge Plan: [place] In-house Referral: Clinical Social Work     Living arrangements for the past 2 months: [place] Expected Discharge Date:  (unknown)                                    Prior Living Arrangements/Services Living arrangements for the past 2 months: [place] Lives with:: Facility Resident Patient language and need for interpreter reviewed:: Yes Do you feel safe going back to the place where you live?: Yes      Need for Family Participation in Patient Care: No (Comment) Care giver support system in place?: Yes (comment)   Criminal Activity/Legal Involvement Pertinent to Current Situation/Hospitalization: No - Comment as needed  Activities of Daily Living Home Assistive Devices/Equipment: Blood pressure cuff, Grab bars around toilet, Grab bars in shower, Hand-held shower hose, Hospital bed, Walker (specify type), Eyeglasses, Other (Comment) ([facility] has necessary equipment for their residents, foley catheter) ADL Screening (condition at time of admission) Patient's cognitive ability adequate to safely complete daily activities?: Yes Is the patient deaf or have difficulty hearing?: No Does the patient have difficulty seeing, even when wearing glasses/contacts?: No Does the patient have difficulty concentrating, remembering, or making  decisions?: No Patient able to express need for assistance with ADLs?: Yes Does the patient have difficulty dressing or bathing?: Yes Independently performs ADLs?: No Communication: Independent Dressing (OT): Needs assistance Is this a change from baseline?: Pre-admission baseline Grooming: Needs assistance Is this a change from baseline?: Pre-admission baseline Feeding: Needs assistance Is this a change from baseline?: Pre-admission baseline Bathing: Needs assistance Is this a change from baseline?: Pre-admission baseline Toileting: Needs assistance Is this a change from baseline?: Pre-admission baseline In/Out Bed: Needs assistance Is this a change from baseline?: Pre-admission baseline Walks in Home: Needs assistance Is this a change from baseline?: Pre-admission baseline Does the patient have difficulty walking or climbing stairs?: Yes (secondary to weakness) Weakness of Legs: Both Weakness of Arms/Hands: Both  Permission Sought/Granted                  Emotional Assessment       Orientation: : Oriented to Self, Oriented to Place, Oriented to  Time, Oriented to Situation Alcohol / Substance Use: Not Applicable Psych Involvement: No (comment)  Admission diagnosis:  Lower urinary tract infectious disease [N39.0] Hemorrhagic cystitis [N30.91] AKI (acute kidney injury) ([place]) [N17.9] Hydronephrosis, unspecified hydronephrosis type [N13.30] Patient Active Problem List   Diagnosis Date Noted  . AKI (acute kidney injury) ([place]) 12/24/2019  . 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 Pelvic abscess with colovescial fistula 11/15/2019  . E. coli UTI (urinary tract infection) 11/15/2019  . PTSD (post-traumatic stress disorder)   . Chemical exposure   . Cervical radiculopathy   .  Septic shock ([place]) 11/08/2019  . Protein-calorie malnutrition, severe 10/23/2019  . Cerebral embolism with cerebral infarction 10/18/2019  . Acute left-sided weakness 10/17/2019  . CKD (chronic kidney  disease), stage IV ([place]) 10/17/2019  . Chronic pain 10/17/2019  . Fall 10/17/2019  . Pseudomonas urinary tract infection 08/24/2019  . BPH (benign prostatic hyperplasia) 08/24/2019  . Acute on chronic renal failure ([place]) 08/24/2019  . Anemia 08/24/2019  . Dyspnea 08/24/2019  . History of COVID-19 08/24/2019  . History of elevated PSA 08/24/2019  . 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 Hyponatremia 08/24/2019  . Metabolic acidosis 69/45/0388  . Skin lesion of neck 08/24/2019  . Toxic metabolic encephalopathy 82/80/0349  . Cervical spondylosis without myelopathy 06/19/2016  . Neck pain 09/04/2015  . History of stomach ulcers 05/19/2013  . Male erectile dysfunction 05/19/2013  . Hodgkin lymphoma, unspecified, unspecified site ([place]) 12/10/2012  . Iron deficiency anemia 12/10/2012  . Pernicious anemia 12/10/2012  . Arthralgia of multiple sites 04/20/2012  . Postlaminectomy syndrome, not elsewhere classified 04/20/2012   PCP:  Clinic, [place]:   [place], [place] [facility] [phone] [place] [address] Phone: [phone] Fax: [phone]     Social Determinants of Health (SDOH) Interventions    Readmission Risk Interventions Readmission Risk Prevention Plan 12/25/2019 11/19/2019  Transportation Screening Complete Complete  Medication Review (RN Care Manager) Complete Complete  PCP or Specialist appointment within 3-5 days of discharge - Complete  HRI or [place] - Complete  SW Recovery Care/Counseling Consult - Complete  [place] - Complete  Some recent data might be hidden

## 2019-12-25 NOTE — Op Note (Signed)
Operative Note  Preoperative diagnosis:  1.  Bilateral hydronephrosis with acute renal insufficiency  Postoperative diagnosis: 1.  Bilateral hydronephrosis with acute renal insufficiency  Procedure(s): 1.  Cystoscopy with bilateral retrograde pyelogram, left ureteral stent placement, attempted but failed right ureteral stent placement  Surgeon: Link Snuffer, MD  Assistants: None  Anesthesia: General  Complications: None immediate  EBL: Minimal  Specimens: 1.  None  Drains/Catheters: 1.  Left 8 x 26 double-J ureteral stent 2.  20 French Foley catheter  Intraoperative findings: 1.  Normal anterior urethra 2.  Borderline obstructing prostate 3.  Diffusely edematous mucosa of the bladder.  Moderate amount of debris.  Left retrograde pyelogram revealed evidence of hydroureteronephrosis that was severe.  In the proximal ureter, there was some severe tortuosity.  I was able to get the wire up to the proximalmost portion of the ureter to place a stent.  Concern that the left-sided stent may not have gone high enough to relieve the obstruction completely.  Right retrograde pyelogram similarly showed severe hydroureteronephrosis.  Despite multiple attempts, unable to advance a wire up to the level of the kidney due to severe proximal tortuous ureter.  Indication: 54 year old male with a complicated urological history.  He has a possible colovesical fistula.  He has undergone cystoscopy with left ureteral stent placement and attempted right ureteral stent placement in the past.  He subsequent underwent right-sided nephrostomy tube and this was subsequently internalized into a ureteral stent.  He presented with a creatinine of 7 and severe bilateral hydronephrosis despite stents being in good position.  I spoke with Dr. Annamaria Boots with interventional radiology regarding nephrostomy tube placement versus stent exchange.  Decision was made to proceed with stent exchange first and attempt to avoid a  nephrostomy tube.  Description of procedure:  The patient was identified and consent was obtained.  The patient was taken to the operating room and placed in the supine position.  The patient was placed under general anesthesia.  Perioperative antibiotics were administered.  The patient was placed in dorsal lithotomy.  Patient was prepped and draped in a standard sterile fashion and a timeout was performed.  A 23 French rigid cystoscope was advanced into the urethra and into the bladder.  Complete cystoscopy was performed.  The left ureter was cannulated with a wire which was advanced alongside the previously placed stent.  The scope was withdrawn keeping the wire in place.  I then withdrew the left-sided ureteral stent.  I backloaded the wire onto a dual-lumen ureteral catheter and shot a retrograde pyelogram through the scope.  There was noted to be severe tortuosity of the proximal ureter.  I was able to access the renal pelvis with a wire but the wire did do a full loop in order to get there.  I advanced an open-ended ureteral catheter over the wire to try to straighten the area of tortuosity.  I was able to advance the wire a little bit further but the area of tortuosity would not straighten out.  I decided to go ahead and place an 8 x 26 double-J ureteral stent.  Proximal curl was noted to be in the area of tortuosity at the dilated proximal ureter.  May be in an appropriate position to allow for adequate drainage.  Therefore, I decided to keep it.  I turned my attention to the right side.  The right ureter was cannulated with a sensor wire which was advanced up to the proximal ureter.  Even though the other stent  was in place, the wire would not advance past the proximal ureter.  I therefore decided to remove the ureteral stent.  I advanced a dual-lumen ureteral catheter over the wire and shot a retrograde pyelogram with findings noted above.  I tried to use this to navigate the wire into the kidney  and despite multiple attempts of manipulation as well as trying multiple different wires, I was unable to access the kidney with the wire.  Therefore, decision was made to abort the procedure on the right.  I withdrew the scope and placed a 20 French Foley catheter.  This include the operation.  Patient tolerated the procedure well and was stable postoperative.  At the end the case, I spoke with Dr. Annamaria Boots who was unable to place a nephrostomy tube at this time due to other emergencies going on.  Decision was made to go ahead and wake up the patient and obtain a renal ultrasound to evaluate the left kidney after stenting as well as the right kidney in preparation for possible nephrostomy tube.  Obtain renal function in the morning as well.

## 2019-12-25 NOTE — Progress Notes (Signed)
Pharmacy Antibiotic Note  Chase [name] is a 54 y.o. male admitted on 12/24/2019 with bilateral severe hydronephrosis s/p cystoscopy with bilateral ureteral stent exchange and exchange of Foley catheter .  Pharmacy has been consulted for Zosyn dosing for possible complicated UTI.  Plan: Zosyn 2.25g IV q8h Follow up renal function & cultures  Weight: 53.8 kg (118 lb 9.6 oz)  Temp (24hrs), Avg:98 F (36.7 C), Min:97.6 F (36.4 C), Max:98.4 F (36.9 C)  Recent Labs  Lab 12/24/19 1023 12/25/19 0217  WBC 7.1 6.0  CREATININE 7.19* 7.65*    Estimated Creatinine Clearance: 8.2 mL/min (A) (by C-G formula based on SCr of 7.65 mg/dL (H)).    Allergies  Allergen Reactions  . Nsaids Other (See Comments)    Bleeding GI Ulceration    Antimicrobials this admission:  9/25 Cefaz x 1 9/25 Zosyn >>  Dose adjustments this admission:   Microbiology results:  9/24 UCx: reincubate  11/15/19 UCx: - E.coli (S cefepime, cipro, gent, imip, zosyn, bactrim) - Pseudomonas (S all tested)  Thank you for allowing pharmacy to be a part of this patient's care.  Peggyann Juba, PharmD, BCPS Pharmacy: [phone] 12/25/2019 12:23 PM

## 2019-12-25 NOTE — Transfer of Care (Signed)
Immediate Anesthesia Transfer of Care Note  Patient: Chase [name]  Procedure(s) Performed: CYSTOSCOPY WITH bilateral  RETROGRADE PYELOGRAM/ leftURETERAL STENT PLACEMENT (Bilateral Ureter)  Patient Location: PACU  Anesthesia Type:General  Level of Consciousness: awake and drowsy  Airway & Oxygen Therapy: Patient Spontanous Breathing and Patient connected to face mask oxygen  Post-op Assessment: Report given to RN and Post -op Vital signs reviewed and stable  Post vital signs: Reviewed and stable  Last Vitals:  Vitals Value Taken Time  BP 105/60 12/25/19 1302  Temp    Pulse 70 12/25/19 1305  Resp 10 12/25/19 1305  SpO2 100 % 12/25/19 1305  Vitals shown include unvalidated device data.  Last Pain:  Vitals:   12/25/19 1044  TempSrc:   PainSc: 8       Patients Stated Pain Goal: 2 (14/44/58 4835)  Complications: No complications documented.

## 2019-12-25 NOTE — Anesthesia Procedure Notes (Signed)
Procedure Name: LMA Insertion Date/Time: 12/25/2019 11:47 AM Performed by: [place] Samanda Buske C, CRNA Pre-anesthesia Checklist: Patient identified, Emergency Drugs available, Suction available and Patient being monitored Patient Re-evaluated:Patient Re-evaluated prior to induction Oxygen Delivery Method: Circle system utilized Preoxygenation: Pre-oxygenation with 100% oxygen Induction Type: IV induction Ventilation: Mask ventilation without difficulty LMA: LMA inserted LMA Size: 4.0 Number of attempts: 1 Airway Equipment and Method: Bite block Placement Confirmation: positive ETCO2 Tube secured with: Tape Dental Injury: Teeth and Oropharynx as per pre-operative assessment

## 2019-12-25 NOTE — Progress Notes (Signed)
Palliative Medicine Team consult was received.   Chart reviewed and attempted to meet with patient this afternoon but off the floor for procedure  Plan to follow-up with patient (and his family if applicable) later this afternoon vs tomorrow.   If there are urgent needs or questions please call [phone]. Thank you for consulting out team to assist with this patients care.  Micheline Rough, MD [place] Palliative Medicine Team [phone]  NO CHARGE NOTE

## 2019-12-25 NOTE — Progress Notes (Signed)
[facility] Progress Note  Subjective: seen in room, no c/o, Ox 3  Vitals:   12/25/19 1330 12/25/19 1345 12/25/19 1415 12/25/19 1433  BP: 116/67 112/64  102/62  Pulse: 76 78  67  Resp: $Remo'12 12 10 14  'Rnyks$ Temp:  97.6 F (36.4 C)  97.8 F (36.6 C)  TempSrc:    Oral  SpO2: 100% 100%  100%  Weight:        Exam: Gen pale middle-aged WM, no distress No jvd or bruits Chest clear bilat to bases, no rales or wheezing RRR no MRG Abd soft ntnd no mass or ascites +bs GU foley cath w/ lighter red urine, good amount MS no joint effusions or deformity Ext no leg or UE edema, no wounds or ulcers Neuro is alert, Ox 3 , nf, no asterixis    Home meds:  - cymbalta 30-60 hs/ neurontin 800 hs/ norco qid prn  - midodrine 2.5- 5 tid prn  - flomax 0.4 qd  - ensure tid bm/ MVI/ atarax prn      CT renal stone study noncon >  IMPRESSION: 1. Reaccumulation of small to moderate volume of fluid above the urinary bladder in this patient with suspected defect in the urinary bladder. No adjacent colonic thickening. Study limited by lack of rectal or vesicle contrast with respect to detection of colovesical fistula though the fat plane appears reasonably well preserved between the collection in the colon on the current study. 2. Interval development of moderate to marked bilateral hydroureteronephrosis despite presence of bilateral ureteral stents. 3. Aortic atherosclerosis.    VS BP 114/ 66, HR 70  RR 18  RA 100%  Temp 98   Na 131 K 5.5  CO2 7  BUN 81  Cr 7.19  Ca 8.0  Hb 8.9  WBC 7k  plt 220   COVID negative   UA many bact >50 rbc/ wbc, 100 prot, turbid     Assessment/ Plan: 1. AoCKD 4 - b/l creat 2.4- 2.8, eGFR 24- 38ml/min. AKI presumably due to obstruction, albeit pt dose have poor baseline renal function. Went to OR today has L ureteral stent re-placed, could not get R ureteral stent in though. IR busy and could not do perc nephrostomy. No indication for RRT at this time. F/U US  ordered, repeat labs in am. Will follow.  2. Met acidosis - severe, pt not symptomatic , getting bicarb gtt 3. Mild hyperkalemia - use RENAL DIET when eating, K+ better < 5.  4. Hematuria/ bilat hydronephrosis/ hx ureteral stents - as above 5. H/o CVA - left hemiparesis, SNF dependent       Chase [name] 12/25/2019, 5:10 PM   Recent Labs  Lab 12/24/19 1023 12/24/19 1023 12/24/19 1851 12/25/19 0217  K 5.5*  --   --  4.7  BUN 81*  --   --  86*  CREATININE 7.19*  --   --  7.65*  CALCIUM 8.0*  --   --  7.8*  HGB 8.9*   < > 9.4* 8.6*   < > = values in this interval not displayed.   Inpatient medications: . Chlorhexidine Gluconate Cloth  6 each Topical Daily  . feeding supplement (ENSURE ENLIVE)  237 mL Oral TID BM  . multivitamin with minerals  1 tablet Oral Daily  . nicotine  14 mg Transdermal Daily  . nutrition supplement (JUVEN)  1 packet Oral BID BM  . tamsulosin  0.4 mg Oral Daily   . ceFAZolin    .  piperacillin-tazobactam (ZOSYN)  IV    . sodium bicarbonate (isotonic) 150 mEq in D5W 1000 mL infusion 150 mL/hr at 12/25/19 1101   acetaminophen **OR** acetaminophen, HYDROcodone-acetaminophen

## 2019-12-25 NOTE — Anesthesia Preprocedure Evaluation (Addendum)
Anesthesia Evaluation  Patient identified by MRN, date of birth, ID band Patient awake    Reviewed: Allergy & Precautions, NPO status , Patient's Chart, lab work & pertinent test results  Airway Mallampati: II       Dental  (+) Dental Advisory Given, Chipped, Poor Dentition,    Pulmonary    Pulmonary exam normal        Cardiovascular negative cardio ROS Normal cardiovascular exam     Neuro/Psych PSYCHIATRIC DISORDERS Anxiety  Neuromuscular disease CVA, Residual Symptoms    GI/Hepatic Neg liver ROS, GERD  Medicated,  Endo/Other  negative endocrine ROS  Renal/GU Renal InsufficiencyRenal disease     Musculoskeletal   Abdominal Normal abdominal exam  (+)   Peds  Hematology  (+) Blood dyscrasia (Thrombocytopenia), anemia ,   Anesthesia Other Findings   1. There is a mass like structure noted in the right pulmonary artery.  This is seen on the suprasternal notch views. Unable to exclude pulmonary  embolism. Given elevated troponin, would consider CT PE study to exclude  PE.  2. Left ventricular ejection fraction, by estimation, is 60 to 65%. The  left ventricle has normal function. The left ventricle has no regional  wall motion abnormalities. Left ventricular diastolic parameters are  consistent with Grade I diastolic  dysfunction (impaired relaxation).  3. Right ventricular systolic function is normal. The right ventricular  size is normal. Tricuspid regurgitation signal is inadequate for assessing  PA pressure.  4. There is a moderate pericardial effusion up to 1.5 cm over the  anterior RV and LV apex. There is no RA/RV diastolic collapse. There is no  respiratory variation in MV inflow. The IVC is now well visualized. There  are no overt echocardiographic signs  of tamponade. Moderate pericardial effusion. The pericardial effusion is  anterior to the right ventricle and surrounding the apex. There is no   evidence of cardiac tamponade.  5. The mitral valve is grossly normal. Trivial mitral valve  regurgitation. No evidence of mitral stenosis.  6. The aortic valve is tricuspid. Aortic valve regurgitation is not  visualized. No aortic stenosis is present.   FINDINGS   Reproductive/Obstetrics                            Anesthesia Physical  Anesthesia Plan  ASA: III  Anesthesia Plan: General   Post-op Pain Management:    Induction: Intravenous  PONV Risk Score and Plan: 3 and Midazolam, Dexamethasone and Ondansetron  Airway Management Planned: LMA  Additional Equipment: None  Intra-op Plan:   Post-operative Plan: Extubation in OR  Informed Consent: I have reviewed the patients History and Physical, chart, labs and discussed the procedure including the risks, benefits and alternatives for the proposed anesthesia with the patient or authorized representative who has indicated his/her understanding and acceptance.     Dental advisory given  Plan Discussed with: CRNA  Anesthesia Plan Comments:        Anesthesia Quick Evaluation

## 2019-12-25 NOTE — Interval H&P Note (Signed)
History and Physical Interval Note:  12/25/2019 10:19 AM  Chase [name]  has presented today for surgery, with the diagnosis of bilateral ureteral obstruction.  The various methods of treatment have been discussed with the patient and family. After consideration of risks, benefits and other options for treatment, the patient has consented to  Procedure(s): CYSTOSCOPY WITH RETROGRADE PYELOGRAM/URETERAL STENT PLACEMENT (Bilateral) as a surgical intervention.  The patient's history has been reviewed, patient examined, no change in status, stable for surgery.  I have reviewed the patient's chart and labs.  Questions were answered to the patient's satisfaction.     Marton Redwood, III

## 2019-12-25 NOTE — Progress Notes (Signed)
Urology Inpatient Progress Report     Intv/Subj: No acute events overnight. Patient is without complaint. Creatinine remains elevated at 7.65 which is worse than yesterday.  He had 1250 of urine output yesterday, 800 of which was overnight.   Active Problems:   AKI (acute kidney injury) ([place])  Current Facility-Administered Medications  Medication Dose Route Frequency Provider Last Rate Last Admin  . acetaminophen (TYLENOL) tablet 650 mg  650 mg Oral Q6H PRN Marylyn Ishihara, Tyrone A, DO       Or  . acetaminophen (TYLENOL) suppository 650 mg  650 mg Rectal Q6H PRN Marylyn Ishihara, Tyrone A, DO      . Chlorhexidine Gluconate Cloth 2 % PADS 6 each  6 each Topical Daily Marton Redwood III, MD      . feeding supplement (ENSURE ENLIVE) (ENSURE ENLIVE) liquid 237 mL  237 mL Oral TID BM Kyle, Tyrone A, DO   237 mL at 12/24/19 1946  . HYDROcodone-acetaminophen (NORCO/VICODIN) 5-325 MG per tablet 1 tablet  1 tablet Oral Q4H PRN Marylyn Ishihara, Tyrone A, DO   1 tablet at 12/24/19 1715  . multivitamin with minerals tablet 1 tablet  1 tablet Oral Daily Marylyn Ishihara, Tyrone A, DO   1 tablet at 12/24/19 1946  . nicotine (NICODERM CQ - dosed in mg/24 hours) patch 14 mg  14 mg Transdermal Daily Kyle, Tyrone A, DO      . sodium bicarbonate 150 mEq in dextrose 5 % 1,000 mL infusion   Intravenous Continuous Roney Jaffe, MD 100 mL/hr at 12/25/19 0512 New Bag at 12/25/19 4481  . sodium chloride 0.9 % bolus 2,000 mL  2,000 mL Intravenous Once Roney Jaffe, MD      . tamsulosin [facility]) capsule 0.4 mg  0.4 mg Oral Daily Kyle, Tyrone A, DO         Objective: Vital: Vitals:   12/24/19 2145 12/24/19 2200 12/25/19 0149 12/25/19 0543  BP: 122/67  118/70 (!) 101/55  Pulse: 64  62 65  Resp: 15  16 16   Temp:   97.7 F (36.5 C) 98.4 F (36.9 C)  TempSrc:   Oral Oral  SpO2: 98%  100% 100%  Weight:  53.8 kg     I/Os: I/O last 3 completed shifts: In: 938 [P.O.:240; I.V.:698] Out: 1250 [Urine:1250]  Physical Exam:  General: Patient is  in no apparent distress Lungs: Normal respiratory effort, chest expands symmetrically. GI: The abdomen is soft and nontender without mass. Foley: Draining light red urine Ext: lower extremities symmetric  Lab Results: Recent Labs    12/24/19 1023 12/24/19 1851 12/25/19 0217  WBC 7.1  --  6.0  HGB 8.9* 9.4* 8.6*  HCT 28.3* 29.4* 27.5*   Recent Labs    12/24/19 1023 12/25/19 0217  NA 131* 132*  K 5.5* 4.7  CL 108 106  CO2 7* 8*  GLUCOSE 99 115*  BUN 81* 86*  CREATININE 7.19* 7.65*  CALCIUM 8.0* 7.8*   No results for input(s): LABPT, INR in the last 72 hours. No results for input(s): LABURIN in the last 72 hours. Results for orders placed or performed during the hospital encounter of 12/24/19  Urine culture     Status: None (Preliminary result)   Collection Time: 12/24/19 10:33 AM   Specimen: Urine, Clean Catch  Result Value Ref Range Status   Specimen Description   Final    URINE, CLEAN CATCH Performed at [facility], [place] [address], [place], [place] [address]    Special Requests  Final    NONE Performed at [facility], [place] [address], [place], [place] [address]    Culture   Final    CULTURE REINCUBATED FOR BETTER GROWTH Performed at [facility], [place] [address] [place], [place] [address]    Report Status PENDING  Incomplete  Respiratory Panel by RT PCR (Flu A&B, Covid) - Nasopharyngeal Swab     Status: None   Collection Time: 12/24/19  2:22 PM   Specimen: Nasopharyngeal Swab  Result Value Ref Range Status   SARS Coronavirus 2 by RT PCR NEGATIVE NEGATIVE Final    Comment: (NOTE) SARS-CoV-2 target nucleic acids are NOT DETECTED.  The SARS-CoV-2 RNA is generally detectable in upper respiratoy specimens during the acute phase of infection. The lowest concentration of SARS-CoV-2 viral copies this assay can detect is 131 copies/mL. A negative result does not preclude SARS-Cov-2 infection and should not be used as  the sole basis for treatment or other patient management decisions. A negative result may occur with  improper specimen collection/handling, submission of specimen other than nasopharyngeal swab, presence of viral mutation(s) within the areas targeted by this assay, and inadequate number of viral copies (<131 copies/mL). A negative result must be combined with clinical observations, patient history, and epidemiological information. The expected result is Negative.  Fact Sheet for Patients:  PinkCheek.be  Fact Sheet for Healthcare Providers:  GravelBags.it  This test is no t yet approved or cleared by the [place] FDA and  has been authorized for detection and/or diagnosis of SARS-CoV-2 by FDA under an Emergency Use Authorization (EUA). This EUA will remain  in effect (meaning this test can be used) for the duration of the COVID-19 declaration under Section 564(b)(1) of the Act, 21 U.S.C. section 360bbb-3(b)(1), unless the authorization is terminated or revoked sooner.     Influenza A by PCR NEGATIVE NEGATIVE Final   Influenza B by PCR NEGATIVE NEGATIVE Final    Comment: (NOTE) The Xpert Xpress SARS-CoV-2/FLU/RSV assay is intended as an aid in  the diagnosis of influenza from Nasopharyngeal swab specimens and  should not be used as a sole basis for treatment. Nasal washings and  aspirates are unacceptable for Xpert Xpress SARS-CoV-2/FLU/RSV  testing.  Fact Sheet for Patients: PinkCheek.be  Fact Sheet for Healthcare Providers: GravelBags.it  This test is not yet approved or cleared by the [place] FDA and  has been authorized for detection and/or diagnosis of SARS-CoV-2 by  FDA under an Emergency Use Authorization (EUA). This EUA will remain  in effect (meaning this test can be used) for the duration of the  Covid-19 declaration under Section 564(b)(1)  of the Act, 21  U.S.C. section 360bbb-3(b)(1), unless the authorization is  terminated or revoked. Performed at [facility], [place] [address] [place], [place] [address]     Studies/Results: CT Renal Stone Study  Addendum Date: 12/24/2019   ADDENDUM REPORT: 12/24/2019 12:35 ADDENDUM: The possibility of a leak in the posterior urinary bladder in this patient thought to have a fistula was again discussed with the provider. Additionally, the presence of marked bilateral hydronephrosis, worse on the RIGHT also was discussed. Perhaps a cystogram prior to stented exchange if will not significantly delay care could be helpful for further management as clinically warranted. This would avoid the confounding effect of contrast which could be introduced during stent exchange. Urologic consultation was suggested. These results were called by telephone at the time of interpretation on 12/24/2019 at 12:34 pm to provider  HINA KHATRI , who verbally acknowledged these results. Electronically Signed   By: Zetta Bills M.D.   On: 12/24/2019 12:35   Result Date: 12/24/2019 CLINICAL DATA:  Flank pain, kidney stone suspected EXAM: CT ABDOMEN AND PELVIS WITHOUT CONTRAST TECHNIQUE: Multidetector CT imaging of the abdomen and pelvis was performed following the standard protocol without IV contrast. COMPARISON:  12/03/2019 FINDINGS: Lower chest: Lung bases are clear. No consolidation. No pleural effusion. Hepatobiliary: Liver grossly normal.  Post cholecystectomy. Pancreas: No peripancreatic stranding or ductal dilation. Spleen: Spleen normal in size and contour. Adrenals/Urinary Tract: Adrenal glands are normal. Moderate to marked bilateral collecting system and renal pelvic distension increased when compared to 12/03/2019. Ureteral stents are in place, proximal loops within the area of the renal pelvis bilaterally, distal loops within the urinary bladder. Gas within the urinary bladder and thickened wall.  Perivesical fluid collection with reaccumulation of some fluid following drain removal within the rectoprostatic region measuring 6.6 x 2.3 x 6.2 cm. Foley catheter remains in place. Stomach/Bowel: Post gastric bypass procedure. No significant colonic thickening normal appendix. Vascular/Lymphatic: Aortic atheromatous plaque. No aneurysmal dilation. There is no gastrohepatic or hepatoduodenal ligament lymphadenopathy. No retroperitoneal or mesenteric lymphadenopathy. No pelvic sidewall lymphadenopathy. Reproductive: Fluid collection about the bladder. Prostate not well evaluated. Other: No free air. Postoperative changes associated with prior herniorrhaphy in the LEFT lower quadrant. Musculoskeletal: Signs of prior trauma to the RIGHT and LEFT chest with rib fractures of varying ages. No acute musculoskeletal process or destructive bone finding. IMPRESSION: 1. Reaccumulation of small to moderate volume of fluid above the urinary bladder in this patient with suspected defect in the urinary bladder. No adjacent colonic thickening. Study limited by lack of rectal or vesicle contrast with respect to detection of colovesical fistula though the fat plane appears reasonably well preserved between the collection in the colon on the current study. 2. Interval development of moderate to marked bilateral hydroureteronephrosis despite presence of bilateral ureteral stents. 3. Aortic atherosclerosis. Call is out to the referring provider to further discuss findings in the above case. Aortic Atherosclerosis (ICD10-I70.0). Electronically Signed: By: Zetta Bills M.D. On: 12/24/2019 12:18    Assessment: Acute renal insufficiency Bilateral hydronephrosis  Plan: Plan to go to the operating room today for cystoscopy with bilateral ureteral stent exchange and exchange of Foley catheter.  Possible fulguration during.   Link Snuffer, MD Urology 12/25/2019, 10:15 AM

## 2019-12-25 NOTE — Progress Notes (Addendum)
PROGRESS NOTE   Chase [name]  HBZ:[id number]    DOB: 26-Feb-1966    DOA: 12/24/2019  PCP: [name]   I have briefly reviewed patients previous medical records in [facility].  Chief Complaint  Patient presents with  . 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  CT abdomen showed severe bilateral hydronephrosis and possible colovesical fistula, creatinine 7.1 (baseline 2.4-2.8).  Admitted for acute on chronic kidney disease, severe anion gap metabolic acidosis, hyperkalemia, bilateral hydronephrosis.  Nephrology and urology consulting.    Assessment & Plan:  Active Problems:   AKI (acute kidney injury) ([place])   Acute on stage IV chronic kidney disease Nephrology consulting.  Baseline creatinine 2.4-2.8.  Presented with creatinine of 7.1, potassium 5.5 and bicarbonate 7.  CT abdomen shows bilateral severe hydronephrosis.  Has some myoclonic jerks which may be early uremia related.  Foley catheter in place.  Despite bicarbonate drip, creatinine up from 7.19-7.65 and bicarbonate from 7-8. Nephrology bolusing with IV fluids and have increased IV bicarbonate drip to 150 mL/h.  Suspecting obstruction as the primary issue along with some dehydration.  Follow BMP closely.  Hyperkalemia: Resolved.  Follow BMP closely.  Anion gap metabolic acidosis: Since admission, bicarbonate has only moved from 7-8.  Bicarbonate drip increased.  Follow BMP closely.  Bilateral hydronephrosis/ureteral stent/hematuria: Urology input appreciated,  plan is to go to the OR today for cystoscopy with bilateral ureteral stent exchange and exchange of Foley catheter.  CT done this admission did not seem to show the colovesical fistula.  Possible complicated UTI: In a patient with recent urological interventions, dysuria although he is afebrile and without leukocytosis.  Previous urine cultures had shown E. coli and Pseudomonas.  Will cover with IV Zosyn pending urine culture results and patient is going to have more interventions.  Macrocytic anemia: Stable.  Anxiety and depression: Cymbalta on hold due to renal insufficiency  History of CVA with residual left hemiparesis: SNF resident.  Therapies evaluation when able.  Body mass index is 21.01 kg/m.  Nutritional Status Nutrition Problem: Inadequate oral intake Etiology: acute illness (bilateral hydronephrosis) Signs/Symptoms: meal completion < 25% Interventions: Ensure Enlive (each supplement provides 350kcal and 20 grams of protein), MVI, Juven  DVT prophylaxis: SCDs Start: 12/24/19 1538     Code Status: Full Code  Family Communication:  Disposition:  Status is: Inpatient  Remains inpatient appropriate because:Inpatient level of care appropriate due to severity of illness   Dispo: The patient is from: Home              Anticipated d/c is to: SNF              Anticipated d/c date is: > 3 days              Patient currently is not medically stable to d/c.        Consultants:   Nephrology Urology  Procedures:   None  Antimicrobials:    Anti-infectives (From admission, onward)   Start     Dose/Rate Route Frequency Ordered Stop   12/25/19 1132  ceFAZolin (ANCEF) 2-4 GM/100ML-% IVPB  Note to Pharmacy: [place]  : cabinet override      12/25/19 1132 12/25/19 2344        Subjective:  Seen this morning prior to procedure.  Hungry, n.p.o.  No other complaints reported.  Objective:   Vitals:   12/24/19 2145 12/24/19 2200 12/25/19 0149 12/25/19  0543  BP: 122/67  118/70 (!) 101/55  Pulse: 64  62 65  Resp: 15  16 16   Temp:   97.7 F (36.5 C) 98.4 F (36.9 C)  TempSrc:   Oral Oral  SpO2: 98%  100% 100%  Weight:  53.8 kg      General exam: Middle-age male, moderately built, chronically ill looking lying comfortably propped up in bed.  Oral mucosa dry. Respiratory system: Clear to auscultation. Respiratory effort normal. Cardiovascular system: S1 & S2 heard, RRR. No JVD, murmurs, rubs, gallops or clicks. No pedal edema.  Telemetry personally reviewed: Sinus rhythm. Gastrointestinal system: Abdomen is nondistended, soft and nontender. No organomegaly or masses felt. Normal bowel sounds heard. Central nervous system: Alert and oriented. No focal neurological deficits. Extremities: Symmetric 5 x 5 power. Skin: No rashes, lesions or ulcers.  Occasional myoclonic jerks noted. Psychiatry: Judgement and insight appear normal. Mood & affect appropriate.     Data Reviewed:   I have personally reviewed following labs and imaging studies   CBC: Recent Labs  Lab 12/24/19 1023 12/24/19 1851 12/25/19 0217  WBC 7.1  --  6.0  NEUTROABS 5.6  --   --   HGB 8.9* 9.4* 8.6*  HCT 28.3* 29.4* 27.5*  MCV 97.6  --  99.6  PLT 220  --  700    Basic Metabolic Panel: Recent Labs  Lab 12/24/19 1023 12/25/19 0217  NA 131* 132*  K 5.5* 4.7  CL 108 106  CO2 7* 8*  GLUCOSE 99 115*  BUN 81* 86*  CREATININE 7.19* 7.65*  CALCIUM 8.0* 7.8*    Liver Function Tests: Recent Labs  Lab 12/25/19 0217  AST 44*  ALT 76*  ALKPHOS 122  BILITOT 0.3  PROT 6.7  ALBUMIN 2.7*    CBG: No results for input(s): GLUCAP in the last 168 hours.  Microbiology Studies:   Recent Results (from the past 240 hour(s))  Urine culture     Status: None (Preliminary result)   Collection Time: 12/24/19 10:33 AM   Specimen: Urine, Clean Catch  Result Value Ref Range Status   Specimen Description   Final    URINE, CLEAN CATCH Performed at [facility], [place] [address], [place], [place] [address]    Special Requests   Final    NONE Performed at [facility], [place] [address], [place], [place] [address]    Culture   Final    CULTURE REINCUBATED FOR BETTER GROWTH Performed at [facility], [place] [address], [place],  [address]    Report Status PENDING  Incomplete  Respiratory Panel by RT PCR (Flu A&B, Covid) - Nasopharyngeal Swab     Status: None   Collection Time: 12/24/19  2:22 PM   Specimen: Nasopharyngeal Swab  Result Value Ref Range Status   SARS Coronavirus 2 by RT PCR NEGATIVE NEGATIVE Final    Comment: (NOTE) SARS-CoV-2 target nucleic acids are NOT DETECTED.  The SARS-CoV-2 RNA is generally detectable in upper respiratoy specimens during the acute phase of infection. The lowest concentration of SARS-CoV-2 viral copies this assay can detect is 131 copies/mL. A negative result does not  preclude SARS-Cov-2 infection and should not be used as the sole basis for treatment or other patient management decisions. A negative result may occur with  improper specimen collection/handling, submission of specimen other than nasopharyngeal swab, presence of viral mutation(s) within the areas targeted by this assay, and inadequate number of viral copies (<131 copies/mL). A negative result must be combined with clinical observations, patient history, and epidemiological information. The expected result is Negative.  Fact Sheet for Patients:  PinkCheek.be  Fact Sheet for Healthcare Providers:  GravelBags.it  This test is no t yet approved or cleared by the [place] FDA and  has been authorized for detection and/or diagnosis of SARS-CoV-2 by FDA under an Emergency Use Authorization (EUA). This EUA will remain  in effect (meaning this test can be used) for the duration of the COVID-19 declaration under Section 564(b)(1) of the  Act, 21 U.S.C. section 360bbb-3(b)(1), unless the authorization is terminated or revoked sooner.     Influenza A by PCR NEGATIVE NEGATIVE Final   Influenza B by PCR NEGATIVE NEGATIVE Final    Comment: (NOTE) The Xpert Xpress SARS-CoV-2/FLU/RSV assay is intended as an aid in  the diagnosis of influenza from Nasopharyngeal swab specimens and  should not be used as a sole basis for treatment. Nasal washings and  aspirates are unacceptable for Xpert Xpress SARS-CoV-2/FLU/RSV  testing.  Fact Sheet for Patients: PinkCheek.be  Fact Sheet for Healthcare Providers: GravelBags.it  This test is not yet approved or cleared by the [place] FDA and  has been authorized for detection and/or diagnosis of SARS-CoV-2 by  FDA under an Emergency Use Authorization (EUA). This EUA will remain  in effect (meaning this test can be used) for the duration of the  Covid-19 declaration under Section 564(b)(1) of the Act, 21  U.S.C. section 360bbb-3(b)(1), unless the authorization is  terminated or revoked. Performed at [facility], [place] [address] [place], [place] [address]      Radiology Studies:  CT Renal Stone Study  Addendum Date: 12/24/2019   ADDENDUM REPORT: 12/24/2019 12:35 ADDENDUM: The possibility of a leak in the posterior urinary bladder in this patient thought to have a fistula was again discussed with the provider. Additionally, the presence of marked bilateral hydronephrosis, worse on the RIGHT also was discussed. Perhaps a cystogram prior to stented exchange if will not significantly delay care could be helpful for further management as clinically warranted. This would avoid the confounding effect of contrast which could be introduced during stent exchange. Urologic consultation was suggested. These results were called by telephone at the time of interpretation on 12/24/2019 at 12:34 pm to provider [facility] ,  who verbally acknowledged these results. Electronically Signed   By: Zetta Bills M.D.   On: 12/24/2019 12:35   Result Date: 12/24/2019 CLINICAL DATA:  Flank pain, kidney stone suspected EXAM: CT ABDOMEN AND PELVIS WITHOUT CONTRAST TECHNIQUE: Multidetector CT imaging of the abdomen and pelvis was performed following the standard protocol without IV contrast. COMPARISON:  12/03/2019 FINDINGS: Lower chest: Lung bases are clear. No consolidation. No pleural effusion. Hepatobiliary: Liver grossly normal.  Post cholecystectomy. Pancreas: No peripancreatic stranding or ductal dilation. Spleen: Spleen normal in size and contour. Adrenals/Urinary Tract: Adrenal glands are normal. Moderate to marked bilateral collecting system and renal pelvic distension increased when compared to 12/03/2019. Ureteral stents are in place, proximal loops within the area of the renal pelvis bilaterally, distal loops within the urinary bladder. Gas within the urinary bladder and thickened wall. Perivesical  fluid collection with reaccumulation of some fluid following drain removal within the rectoprostatic region measuring 6.6 x 2.3 x 6.2 cm. Foley catheter remains in place. Stomach/Bowel: Post gastric bypass procedure. No significant colonic thickening normal appendix. Vascular/Lymphatic: Aortic atheromatous plaque. No aneurysmal dilation. There is no gastrohepatic or hepatoduodenal ligament lymphadenopathy. No retroperitoneal or mesenteric lymphadenopathy. No pelvic sidewall lymphadenopathy. Reproductive: Fluid collection about the bladder. Prostate not well evaluated. Other: No free air. Postoperative changes associated with prior herniorrhaphy in the LEFT lower quadrant. Musculoskeletal: Signs of prior trauma to the RIGHT and LEFT chest with rib fractures of varying ages. No acute musculoskeletal process or destructive bone finding. IMPRESSION: 1. Reaccumulation of small to moderate volume of fluid above the urinary bladder in this  patient with suspected defect in the urinary bladder. No adjacent colonic thickening. Study limited by lack of rectal or vesicle contrast with respect to detection of colovesical fistula though the fat plane appears reasonably well preserved between the collection in the colon on the current study. 2. Interval development of moderate to marked bilateral hydroureteronephrosis despite presence of bilateral ureteral stents. 3. Aortic atherosclerosis. Call is out to the referring provider to further discuss findings in the above case. Aortic Atherosclerosis (ICD10-I70.0). Electronically Signed: By: Zetta Bills M.D. On: 12/24/2019 12:18     Scheduled Meds:   . Chlorhexidine Gluconate Cloth  6 each Topical Daily  . feeding supplement (ENSURE ENLIVE)  237 mL Oral TID BM  . multivitamin with minerals  1 tablet Oral Daily  . nicotine  14 mg Transdermal Daily  . nutrition supplement (JUVEN)  1 packet Oral BID BM  . tamsulosin  0.4 mg Oral Daily    Continuous Infusions:   . ceFAZolin    . sodium bicarbonate (isotonic) 150 mEq in D5W 1000 mL infusion 150 mL/hr at 12/25/19 1101     LOS: 1 day     Vernell Leep, MD, [place], [facility]. [facility]    To contact the attending provider between 7A-7P or the covering provider during after hours 7P-7A, please log into the web site [email or web address] and access using universal [place] password for that web site. If you do not have the password, please call the hospital operator.  12/25/2019, 11:36 AM

## 2019-12-26 ENCOUNTER — Encounter (HOSPITAL_COMMUNITY): Payer: Self-pay | Admitting: Urology

## 2019-12-26 LAB — CBC
HCT: 20.1 % — ABNORMAL LOW (ref 39.0–52.0)
HCT: 19.9 % — ABNORMAL LOW (ref 39.0–52.0)
Hemoglobin: 6.7 g/dL — CL (ref 13.0–17.0)
Hemoglobin: 6.8 g/dL — CL (ref 13.0–17.0)
MCH: 30.7 pg (ref 26.0–34.0)
MCH: 31.5 pg (ref 26.0–34.0)
MCHC: 33.3 g/dL (ref 30.0–36.0)
MCHC: 34.2 g/dL (ref 30.0–36.0)
MCV: 92.2 fL (ref 80.0–100.0)
MCV: 92.1 fL (ref 80.0–100.0)
Platelets: 204 10*3/uL (ref 150–400)
Platelets: 200 10*3/uL (ref 150–400)
RBC: 2.18 MIL/uL — ABNORMAL LOW (ref 4.22–5.81)
RBC: 2.16 MIL/uL — ABNORMAL LOW (ref 4.22–5.81)
RDW: 17.8 % — ABNORMAL HIGH (ref 11.5–15.5)
RDW: 18 % — ABNORMAL HIGH (ref 11.5–15.5)
WBC: 4.2 10*3/uL (ref 4.0–10.5)
WBC: 4 10*3/uL (ref 4.0–10.5)
nRBC: 0 % (ref 0.0–0.2)
nRBC: 0 % (ref 0.0–0.2)

## 2019-12-26 LAB — BASIC METABOLIC PANEL
Anion gap: 16 — ABNORMAL HIGH (ref 5–15)
BUN: 85 mg/dL — ABNORMAL HIGH (ref 6–20)
CO2: 17 mmol/L — ABNORMAL LOW (ref 22–32)
Calcium: 6 mg/dL — CL (ref 8.9–10.3)
Chloride: 103 mmol/L (ref 98–111)
Creatinine, Ser: 7.65 mg/dL — ABNORMAL HIGH (ref 0.61–1.24)
GFR calc Af Amer: 8 mL/min — ABNORMAL LOW (ref >60)
GFR calc non Af Amer: 7 mL/min — ABNORMAL LOW (ref >60)
Glucose, Bld: 103 mg/dL — ABNORMAL HIGH (ref 70–99)
Potassium: 3.1 mmol/L — ABNORMAL LOW (ref 3.5–5.1)
Sodium: 136 mmol/L (ref 135–145)

## 2019-12-26 LAB — URINE CULTURE: Culture: 70000 — AB

## 2019-12-26 LAB — PREPARE RBC (CROSSMATCH)

## 2019-12-26 MED ORDER — SODIUM CHLORIDE 0.9% IV SOLUTION: Freq: Once | INTRAVENOUS | Status: DC

## 2019-12-26 MED ORDER — SODIUM CHLORIDE 0.9% IV SOLUTION: Freq: Once | INTRAVENOUS | Status: AC

## 2019-12-26 NOTE — Progress Notes (Signed)
Ch[name] Progress Note  Subjective: seen in room, no new c/o's, UOP 1275 cc yesterday. BP's remain normal to soft.  Creat yest was stable at 7.55. Am labs pending today.   Vitals:   12/25/19 1433 12/25/19 1958 12/25/19 2046 12/26/19 0533  BP: 102/62  109/66 (!) 98/55  Pulse: 67  70 66  Resp: 14  20 20  Temp: 97.8 F (36.6 C)  98.1 F (36.7 C) 98.5 F (36.9 C)  TempSrc: Oral  Oral Oral  SpO2: 100%  100% 100%  Weight:      Height:  5' 3" (1.6 m)      Exam: Gen pale middle-aged WM, chron ill appearing, no distress No jvd or bruits Chest clear bilat to bases RRR no MRG Abd soft ntnd no mass or ascites +bs GU foley cath w/ lighter red urine Ext no leg or UE edema Neuro is alert, Ox 3 , nf, +mild asterixis    Home meds:  - cymbalta 30-60 hs/ neurontin 800 hs/ norco qid prn  - midodrine 2.5- 5 tid prn  - flomax 0.4 qd  - ensure tid bm/ MVI/ atarax prn      CT renal stone study noncon >  IMPRESSION: 1. Reaccumulation of small to moderate volume of fluid above the urinary bladder in this patient with suspected defect in the urinary bladder. No adjacent colonic thickening. Study limited by lack of rectal or vesicle contrast with respect to detection of colovesical fistula though the fat plane appears reasonably well preserved between the collection in the colon on the current study. 2. Interval development of moderate to marked bilateral hydroureteronephrosis despite presence of bilateral ureteral stents. 3. Aortic atherosclerosis.    VS BP 114/ 66, HR 70  RR 18  RA 100%  Temp 98   Na 131 K 5.5  CO2 7  BUN 81  Cr 7.19  Ca 8.0  Hb 8.9  WBC 7k  plt 220   COVID negative   UA many bact >50 rbc/ wbc, 100 prot, turbid     Assessment/ Plan: 1. AoCKD 4 - b/l creat 2.4- 2.8, eGFR 24- 29ml/min. AKI presumably due to obstruction w/ new and severe bilat hydronephrosis by imaging.  Urology did procedure 9/25 w/ L ureteral stent replaced but could not get R ureteral  stent in position. Has some asterixis but o/w stable, borderline uremic. Making urine. No indication for RRT at this time. Awaiting am labs today and f/u imaging after replacement of the L ureteral stent. Will follow.   2. Met acidosis - improved, cont bicarb gtt, lowered to 75 cc/hr, I/O's matched and euvolemic on exam 3. Hyperkalemia - resolved 4. Hematuria/ bilat hydronephrosis/ hx ureteral stents - tortuous ureters per the OP note by urology.  5. Possible UTI - on empiric IV zosyn awaiting cx results 6. H/o CVA - left hemiparesis, SNF dependent       Chase [name] 12/26/2019, 6:40 AM   Recent Labs  Lab 12/25/19 0217 12/25/19 1634 12/26/19 0553  K 4.7 3.5  --   BUN 86* 91*  --   CREATININE 7.65* 7.55*  --   CALCIUM 7.8* 6.7*  --   HGB 8.6*  --  6.7*   Inpatient medications: . Chlorhexidine Gluconate Cloth  6 each Topical Daily  . feeding supplement (ENSURE ENLIVE)  237 mL Oral TID BM  . multivitamin with minerals  1 tablet Oral Daily  . nicotine  14 mg Transdermal Daily  . nutrition supplement (JUVEN)    1 packet Oral BID BM  . tamsulosin  0.4 mg Oral Daily   . piperacillin-tazobactam (ZOSYN)  IV 2.25 g (12/25/19 2352)  . sodium bicarbonate (isotonic) 150 mEq in D5W 1000 mL infusion 100 mL/hr at 12/26/19 0432   acetaminophen **OR** acetaminophen, HYDROcodone-acetaminophen, hydrOXYzine

## 2019-12-26 NOTE — Progress Notes (Signed)
Urology Inpatient Progress Report  Lower urinary tract infectious disease [N39.0] Hemorrhagic cystitis [N30.91] AKI (acute kidney injury) ([place]) [N17.9] Hydronephrosis, unspecified hydronephrosis type [N13.30]  Procedure(s): CYSTOSCOPY WITH bilateral  RETROGRADE PYELOGRAM/ leftURETERAL STENT PLACEMENT  1 Day Post-Op   Intv/Subj: No acute events overnight. Patient is without complaint. Renal ultrasound showed persistent bilateral hydronephrosis despite left-sided stent.  Creatinine was worse and is 7.65 today.  Hemoglobin 6.8 and he is set up to receive 2 units of PRBC.  He continues to make urine with 825 overnight, none recorded this shift prior.  Active Problems:   AKI (acute kidney injury) ([place])  Current Facility-Administered Medications  Medication Dose Route Frequency Provider Last Rate Last Admin  . 0.9 %  sodium chloride infusion (Manually program via Guardrails IV Fluids)   Intravenous Once Marton Redwood III, MD      . acetaminophen (TYLENOL) tablet 650 mg  650 mg Oral Q6H PRN Marylyn Ishihara, Tyrone A, DO       Or  . acetaminophen (TYLENOL) suppository 650 mg  650 mg Rectal Q6H PRN Marylyn Ishihara, Tyrone A, DO      . Chlorhexidine Gluconate Cloth 2 % PADS 6 each  6 each Topical Daily Marton Redwood III, MD   6 each at 12/25/19 1103  . feeding supplement (ENSURE ENLIVE) (ENSURE ENLIVE) liquid 237 mL  237 mL Oral TID BM Kyle, Tyrone A, DO   237 mL at 12/25/19 2028  . HYDROcodone-acetaminophen (NORCO/VICODIN) 5-325 MG per tablet 1 tablet  1 tablet Oral Q4H PRN Marylyn Ishihara, Tyrone A, DO   1 tablet at 12/26/19 1050  . hydrOXYzine (ATARAX/VISTARIL) tablet 10 mg  10 mg Oral TID PRN Modena Jansky, MD   10 mg at 12/26/19 1106  . multivitamin with minerals tablet 1 tablet  1 tablet Oral Daily Kyle, Tyrone A, DO   1 tablet at 12/26/19 1050  . nicotine (NICODERM CQ - dosed in mg/24 hours) patch 14 mg  14 mg Transdermal Daily Kyle, Tyrone A, DO      . nutrition supplement (JUVEN) (JUVEN) powder packet 1 packet   1 packet Oral BID BM Hongalgi, Lenis Dickinson, MD   1 packet at 12/26/19 1051  . piperacillin-tazobactam (ZOSYN) IVPB 2.25 g  2.25 g Intravenous Q8H Emiliano Dyer, RPH 100 mL/hr at 12/26/19 1110 2.25 g at 12/26/19 1110  . sodium bicarbonate 150 mEq in dextrose 5 % 1,000 mL infusion   Intravenous Continuous Roney Jaffe, MD 50 mL/hr at 12/26/19 1058 Rate Change at 12/26/19 1058  . tamsulosin (FLOMAX) capsule 0.4 mg  0.4 mg Oral Daily Kyle, Tyrone A, DO   0.4 mg at 12/26/19 1050     Objective: Vital: Vitals:   12/25/19 1958 12/25/19 2046 12/26/19 0500 12/26/19 0533  BP:  109/66  (!) 98/55  Pulse:  70  66  Resp:  20  20  Temp:  98.1 F (36.7 C)  98.5 F (36.9 C)  TempSrc:  Oral  Oral  SpO2:  100%  100%  Weight:   58.9 kg   Height: 5\' 3"  (1.6 m)      I/Os: I/O last 3 completed shifts: In: 3751.8 [P.O.:120; I.V.:3497.8; IV Piggyback:134] Out: 1625 [Urine:1625]  Physical Exam:  General: Patient is in no apparent distress Lungs: Normal respiratory effort, chest expands symmetrically. GI:  The abdomen is soft and nontender without mass. Foley: Draining light red urine Ext: lower extremities symmetric  Lab Results: Recent Labs    12/25/19 0217 12/26/19 0553 12/26/19 1660  WBC 6.0 4.2 4.0  HGB 8.6* 6.7* 6.8*  HCT 27.5* 20.1* 19.9*   Recent Labs    12/25/19 0217 12/25/19 1634 12/26/19 0553  NA 132* 136 136  K 4.7 3.5 3.1*  CL 106 107 103  CO2 8* 14* 17*  GLUCOSE 115* 155* 103*  BUN 86* 91* 85*  CREATININE 7.65* 7.55* 7.65*  CALCIUM 7.8* 6.7* 6.0*   No results for input(s): LABPT, INR in the last 72 hours. No results for input(s): LABURIN in the last 72 hours. Results for orders placed or performed during the hospital encounter of 12/24/19  Urine culture     Status: Abnormal   Collection Time: 12/24/19 10:33 AM   Specimen: Urine, Clean Catch  Result Value Ref Range Status   Specimen Description   Final    URINE, CLEAN CATCH Performed at [facility], [place] [address] [place], [place] [address]    Special Requests   Final    NONE Performed at [facility], [place] [address], [place], [place] [address]    Culture 70,000 COLONIES/mL YEAST (A)  Final   Report Status 12/26/2019 FINAL  Final  Respiratory Panel by RT PCR (Flu A&B, Covid) - Nasopharyngeal Swab     Status: None   Collection Time: 12/24/19  2:22 PM   Specimen: Nasopharyngeal Swab  Result Value Ref Range Status   SARS Coronavirus 2 by RT PCR NEGATIVE NEGATIVE Final    Comment: (NOTE) SARS-CoV-2 target nucleic acids are NOT DETECTED.  The SARS-CoV-2 RNA is generally detectable in upper respiratoy specimens during the acute phase of infection. The lowest concentration of SARS-CoV-2 viral copies this assay can detect is 131 copies/mL. A negative result does not preclude SARS-Cov-2 infection and should not be used as the sole basis for treatment or other patient management decisions. A negative result may occur with  improper specimen collection/handling, submission of specimen other than nasopharyngeal swab, presence of viral mutation(s) within the areas targeted by this assay, and inadequate number of viral copies (<131 copies/mL). A negative result must be combined with clinical observations, patient history, and epidemiological information. The expected result is Negative.  Fact Sheet for Patients:  PinkCheek.be  Fact Sheet for Healthcare Providers:  GravelBags.it  This test is no t yet approved or cleared by the [place] FDA and  has been authorized for detection and/or diagnosis of SARS-CoV-2 by FDA under an Emergency Use Authorization (EUA). This EUA will remain  in effect (meaning this test can be used) for the duration of the COVID-19 declaration under Section 564(b)(1) of the Act, 21 U.S.C. section 360bbb-3(b)(1), unless the authorization is terminated or revoked  sooner.     Influenza A by PCR NEGATIVE NEGATIVE Final   Influenza B by PCR NEGATIVE NEGATIVE Final    Comment: (NOTE) The Xpert Xpress SARS-CoV-2/FLU/RSV assay is intended as an aid in  the diagnosis of influenza from Nasopharyngeal swab specimens and  should not be used as a sole basis for treatment. Nasal washings and  aspirates are unacceptable for Xpert Xpress SARS-CoV-2/FLU/RSV  testing.  Fact Sheet for Patients: PinkCheek.be  Fact Sheet for Healthcare Providers: GravelBags.it  This test is not yet approved or cleared by the [place] FDA and  has been authorized for detection and/or diagnosis of SARS-CoV-2 by  FDA under an Emergency Use Authorization (EUA). This EUA will remain  in effect (meaning this test can be used) for the duration of the  Covid-19 declaration under Section 564(b)(1) of  the Act, 21  U.S.C. section 360bbb-3(b)(1), unless the authorization is  terminated or revoked. Performed at [facility], [place] [address], [place], [place] [address]     Studies/Results: US RENAL  Result Date: 12/25/2019 CLINICAL DATA:  Hydronephrosis. EXAM: RENAL / URINARY TRACT ULTRASOUND COMPLETE COMPARISON:  November 12, 2019 FINDINGS: Right Kidney: Renal measurements: 11.3 x 6.4 x 7.9 cm = volume: 300 mL. Marked hydronephrosis. Increased echogenicity of the renal cortex. Left Kidney: Renal measurements: 10.8 x 9.7 x 10.2 cm = volume: 559 mL. Marked hydronephrosis. Increased echogenicity of the renal cortex. Echogenic material within the lower pole calices may represent residual contrast or inspissated debris/blood products. Stent artifact is seen in the left renal pelvis. Bladder: Mildly distended around urinary Foley. Again seen is isoechoic avascular material within the dependent portion of the urinary bladder. Other: None. IMPRESSION: 1. Marked bilateral hydronephrosis, worse on the left. 2. Increased  cortical echogenicity of the kidneys, consistent with medical renal disease. 3. Left ureteral stent is seen. The right ureteral stent is not seen sonographically. 4. Mildly distended urinary bladder around urinary Foley with isoechoic avascular material within the dependent portion of the urinary bladder, possibly representing debris or organizing blood products. 5. Increased echogenicity within the left lower pole renal calices may represent blood products or inspissated debris. Electronically Signed   By: Fidela Salisbury M.D.   On: 12/25/2019 16:58   DG C-Arm 1-60 Min-No Report  Result Date: 12/25/2019 Fluoroscopy was utilized by the requesting physician.  No radiographic interpretation.   CT Renal Stone Study  Addendum Date: 12/24/2019   ADDENDUM REPORT: 12/24/2019 12:35 ADDENDUM: The possibility of a leak in the posterior urinary bladder in this patient thought to have a fistula was again discussed with the provider. Additionally, the presence of marked bilateral hydronephrosis, worse on the RIGHT also was discussed. Perhaps a cystogram prior to stented exchange if will not significantly delay care could be helpful for further management as clinically warranted. This would avoid the confounding effect of contrast which could be introduced during stent exchange. Urologic consultation was suggested. These results were called by telephone at the time of interpretation on 12/24/2019 at 12:34 pm to provider [facility] , who verbally acknowledged these results. Electronically Signed   By: Zetta Bills M.D.   On: 12/24/2019 12:35   Result Date: 12/24/2019 CLINICAL DATA:  Flank pain, kidney stone suspected EXAM: CT ABDOMEN AND PELVIS WITHOUT CONTRAST TECHNIQUE: Multidetector CT imaging of the abdomen and pelvis was performed following the standard protocol without IV contrast. COMPARISON:  12/03/2019 FINDINGS: Lower chest: Lung bases are clear. No consolidation. No pleural effusion. Hepatobiliary: Liver  grossly normal.  Post cholecystectomy. Pancreas: No peripancreatic stranding or ductal dilation. Spleen: Spleen normal in size and contour. Adrenals/Urinary Tract: Adrenal glands are normal. Moderate to marked bilateral collecting system and renal pelvic distension increased when compared to 12/03/2019. Ureteral stents are in place, proximal loops within the area of the renal pelvis bilaterally, distal loops within the urinary bladder. Gas within the urinary bladder and thickened wall. Perivesical fluid collection with reaccumulation of some fluid following drain removal within the rectoprostatic region measuring 6.6 x 2.3 x 6.2 cm. Foley catheter remains in place. Stomach/Bowel: Post gastric bypass procedure. No significant colonic thickening normal appendix. Vascular/Lymphatic: Aortic atheromatous plaque. No aneurysmal dilation. There is no gastrohepatic or hepatoduodenal ligament lymphadenopathy. No retroperitoneal or mesenteric lymphadenopathy. No pelvic sidewall lymphadenopathy. Reproductive: Fluid collection about the bladder. Prostate not well evaluated. Other: No free air. Postoperative  changes associated with prior herniorrhaphy in the LEFT lower quadrant. Musculoskeletal: Signs of prior trauma to the RIGHT and LEFT chest with rib fractures of varying ages. No acute musculoskeletal process or destructive bone finding. IMPRESSION: 1. Reaccumulation of small to moderate volume of fluid above the urinary bladder in this patient with suspected defect in the urinary bladder. No adjacent colonic thickening. Study limited by lack of rectal or vesicle contrast with respect to detection of colovesical fistula though the fat plane appears reasonably well preserved between the collection in the colon on the current study. 2. Interval development of moderate to marked bilateral hydroureteronephrosis despite presence of bilateral ureteral stents. 3. Aortic atherosclerosis. Call is out to the referring provider to  further discuss findings in the above case. Aortic Atherosclerosis (ICD10-I70.0). Electronically Signed: By: Zetta Bills M.D. On: 12/24/2019 12:18    Assessment: Acute renal insufficiency Bilateral hydronephrosis  Procedure(s): CYSTOSCOPY WITH bilateral  RETROGRADE PYELOGRAM/ leftURETERAL STENT PLACEMENT, 1 Day Post-Op  doing well.  Plan: I discussed his case with Dr. Burnett Sheng.  Though he is making urine, it is likely that elevated creatinine is coming from his obstruction.  Given his hemoglobin of 6.8, we thought it was best to give 2 units of PRBC today and reassess labs in the morning followed by plan for bilateral nephrostomy tube placement tomorrow.  I discussed this plan with Dr. Annamaria Boots with IR.  Order placed for nephrostomy tubes.   Link Snuffer, MD Urology 12/26/2019, 11:37 AM

## 2019-12-26 NOTE — Consult Note (Signed)
Chief Complaint: Patient was seen in consultation today for  Chief Complaint  Patient presents with   Hematuria    Referring Physician(s): Dr. Gloriann Loan   Supervising Physician: Daryll Brod  Patient Status: [facility] - In-pt  History of Present Illness: Chase [name] is a 54 y.o. 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 He is familiar to IR from prior nephrostomy tubes. He has undergone numerous urological procedures in the past.    He presented to the ED 12/24/19 at the direction of his urologist due to complaints of hematuria and dysuria. CT imaging showed bilateral hydronephrosis despite good positioning of his stents. Dr. Gloriann Loan took him to the OR 12/25/19 for cystoscopy with bilateral retrograde pyelogram, left ureteral stent placement and an attempted but failed right ureteral stent placement. US imaging done after the procedure showed: IMPRESSION: 1. Marked bilateral hydronephrosis, worse on the left. 2. Increased cortical echogenicity of the kidneys, consistent with medical renal disease. 3. Left ureteral stent is seen. The right ureteral stent is not seen sonographically. 4. Mildly distended urinary bladder around urinary Foley with isoechoic avascular material within the dependent portion of the urinary bladder, possibly representing debris or organizing blood products. 5. Increased echogenicity within the left lower pole renal calices may represent blood products or inspissated debris.  The patients kidney function has also worsened.    Interventional Radiology has been asked to evaluate this patient for image-guided bilateral nephrostomy tube placements. This case has been reviewed and procedure approved by Dr. Annamaria Boots.   Past Medical History:  Diagnosis Date    Cervical radiculopathy    with left sided weakness   Chemical exposure    service related injury   Chronic pain    GIB (gastrointestinal bleeding)    PTSD (post-traumatic stress disorder)    Renal disorder    CKD   Urinary retention    due to bladder outlet obstruction    Past Surgical History:  Procedure Laterality Date   CYSTOSCOPY W/ URETERAL STENT PLACEMENT N/A 11/14/2019   Procedure: CYSTOSCOPY WITH RETROGRADE PYELOGRAM bilateral /URETERAL STENT PLACEMENT left fulguration bladder . cystogram;  Surgeon: Raynelle Bring, MD;  Location: [facility];  Service: Urology;  Laterality: N/A;   CYSTOSCOPY W/ URETERAL STENT PLACEMENT Bilateral 12/25/2019   Procedure: CYSTOSCOPY WITH bilateral  RETROGRADE PYELOGRAM/ leftURETERAL STENT PLACEMENT;  Surgeon: Lucas Mallow, MD;  Location: [facility];  Service: Urology;  Laterality: Bilateral;   GASTRIC BYPASS     HERNIA REPAIR     IR NEPHROSTOMY PLACEMENT RIGHT  11/16/2019   IR SINUS/FIST TUBE CHK-NON GI  12/03/2019   IR URETERAL STENT PLACEMENT EXISTING ACCESS RIGHT  12/08/2019    Allergies: Nsaids  Medications: Prior to Admission medications   Medication Sig Start Date End Date Taking? Authorizing Provider  cyanocobalamin (,VITAMIN B-12,) 1000 MCG/ML injection Inject 1,000 mcg into the muscle every 30 (thirty) days.  12/28/13  Yes [provider]  docusate sodium (COLACE) 100 MG capsule Take 1 capsule (100 mg total) by mouth 2 (two) times daily as needed for mild constipation. 11/19/19  Yes Antonieta Pert, MD  DULoxetine (CYMBALTA) 30 MG capsule Take 30-60 mg by mouth See admin instructions. Take 2 capsules in the morning and 1 capsule in the evening 08/12/17  Yes [provider]  gabapentin (NEURONTIN) 400 MG capsule Take  800 mg by mouth at bedtime.   Yes [provider]  HYDROcodone-acetaminophen (NORCO/VICODIN) 5-325 MG tablet Take 1-2 tablets by mouth every 4 (four) hours as needed for up to 10 doses for  moderate pain or severe pain (1 moderate, 2 severe). 11/19/19  Yes Antonieta Pert, MD  hydrOXYzine (ATARAX/VISTARIL) 10 MG tablet Take 1 tablet (10 mg total) by mouth 3 (three) times daily as needed for itching or anxiety. 10/26/19  Yes Mikhail, Maryann, DO  midodrine (PROAMATINE) 2.5 MG tablet Take 2 tablets (5 mg total) by mouth 3 (three) times daily with meals. Patient taking differently: Take 2.5-5 mg by mouth 3 (three) times daily as needed (hypotension).  11/19/19  Yes Antonieta Pert, MD  Multiple Vitamin (MULTIVITAMIN WITH MINERALS) TABS tablet Take 1 tablet by mouth daily. 10/27/19  Yes Mikhail, Velta Addison, DO  tamsulosin (FLOMAX) 0.4 MG CAPS capsule Take 0.4 mg by mouth daily.    Yes [provider]  feeding supplement, ENSURE ENLIVE, (ENSURE ENLIVE) LIQD Take 237 mLs by mouth 3 (three) times daily between meals. Patient not taking: Reported on 12/24/2019 10/26/19   Cristal Ford, DO     Family History  Problem Relation Age of Onset   Renal cancer Father    Heart disease Paternal Grandfather     Social History   Socioeconomic History   Marital status: Widowed    Spouse name: Not on file   Number of children: Not on file   Years of education: Not on file   Highest education level: Not on file  Occupational History   Not on file  Tobacco Use   Smoking status: Never Smoker   Smokeless tobacco: Current User  Vaping Use   Vaping Use: Never used  Substance and Sexual Activity   Alcohol use: Never   Drug use: Never   Sexual activity: Not on file  Other Topics Concern   Not on file  Social History Narrative   Not on file   Social Determinants of Health   Financial Resource Strain:    Difficulty of Paying Living Expenses: Not on file  Food Insecurity:    Worried About [place] in the Last Year: Not on file   Ran Out of Food in the Last Year: Not on file  Transportation Needs:    Lack of Transportation (Medical): Not on file   Lack of  Transportation (Non-Medical): Not on file  Physical Activity:    Days of Exercise per Week: Not on file   Minutes of Exercise per Session: Not on file  Stress:    Feeling of Stress : Not on file  Social Connections:    Frequency of Communication with Friends and Family: Not on file   Frequency of Social Gatherings with Friends and Family: Not on file   Attends Religious Services: Not on file   Active Member of Clubs or Organizations: Not on file   Attends Archivist Meetings: Not on file   Marital Status: Not on file    Review of Systems: A 12 point ROS discussed and pertinent positives are indicated in the HPI above.  All other systems are negative.  Review of Systems  Constitutional: Negative for appetite change and fatigue.  Respiratory: Negative for cough and shortness of breath.   Cardiovascular: Negative for chest pain and leg swelling.  Gastrointestinal: Negative for abdominal pain, diarrhea, nausea and vomiting.  Genitourinary: Positive for flank pain.  Musculoskeletal: Negative for back pain.  Neurological: Negative for light-headedness and  headaches.    Vital Signs: BP (!) 100/59    Pulse 79    Temp 98.9 F (37.2 C) (Oral)    Resp 14    Ht 5\' 3"  (1.6 m)    Wt 129 lb 14.4 oz (58.9 kg)    SpO2 100%    BMI 23.01 kg/m   Physical Exam Constitutional:      General: He is not in acute distress.    Comments: cachectic  HENT:     Mouth/Throat:     Mouth: Mucous membranes are moist.     Pharynx: Oropharynx is clear.  Cardiovascular:     Rate and Rhythm: Normal rate and regular rhythm.     Pulses: Normal pulses.     Heart sounds: Normal heart sounds.  Pulmonary:     Effort: Pulmonary effort is normal.     Breath sounds: Normal breath sounds.  Abdominal:     General: Bowel sounds are normal.     Palpations: Abdomen is soft.  Genitourinary:    Comments: Foley catheter; amber-colored urine.  Skin:    General: Skin is warm and dry.  Neurological:      Mental Status: He is alert and oriented to person, place, and time.     Imaging: CT ABDOMEN PELVIS WO CONTRAST  Result Date: 12/03/2019 CLINICAL DATA:  54 year old male with colovesicular fistula and pelvic abscess formation. Follow-up evaluation following percutaneous drain placement on 11/15/2019 EXAM: CT ABDOMEN AND PELVIS WITHOUT CONTRAST TECHNIQUE: Multidetector CT imaging of the abdomen and pelvis was performed following the standard protocol without IV contrast. COMPARISON:  Prior CT scan of the pelvis 11/14/2019 FINDINGS: Lower chest: Chronic elevation of the right hemidiaphragm. The intracardiac blood pool is hypodense relative to the adjacent myocardium consistent with anemia. Small pericardial effusion. Hepatobiliary: Marked colonic interposition. The gallbladder is surgically absent. No discrete hepatic lesion. No intra or extrahepatic biliary ductal dilatation. Pancreas: Unremarkable. No pancreatic ductal dilatation or surrounding inflammatory changes. Spleen: Normal in size without focal abnormality. Adrenals/Urinary Tract: Unremarkable adrenal glands. Right-sided percutaneous nephrostomy tube in good position. No evidence of right-sided hydronephrosis. On the left, a double-J ureteral stent is present. Minimal fullness of the renal pelvis without true hydro. A Foley catheter is pleasant in the bladder. Diffuse circumferential bladder wall thickening again noted. Marked prostatomegaly. Stomach/Bowel: No evidence of obstruction or focal bowel wall thickening. Vascular/Lymphatic: Limited evaluation in the absence of intravenous contrast. No suspicious lymphadenopathy. Atherosclerotic calcifications noted along the aorta. Reproductive: Prostatomegaly. Other: Percutaneous drainage catheter noted in the pelvic cul-de-sac. No residual fluid collection. The abscess appears completely resolved. Musculoskeletal: No acute fracture or aggressive appearing lytic or blastic osseous lesion. IMPRESSION: 1.  Complete interval resolution of abscess from the pelvic cul-de-sac. 2. The percutaneous drainage catheter remains in good position. 3. Additional ancillary findings as above. Electronically Signed   By: Jacqulynn Cadet M.D.   On: 12/03/2019 18:15   IR Sinus/Fist Tube Chk-Non GI  Result Date: 12/03/2019 INDICATION: 54 year old male with a history of suspected colovesicular fistula and associated pelvic abscess formation. A percutaneous drainage catheter was placed on 11/15/2019. CT imaging obtained today demonstrates resolution of the abscess cavity. He now presents for drain injection to evaluate for fistulous communication between the rectum or bladder. EXAM: SINUS TRACT INJECTION / FISTULOGRAM COMPARISON:  CT scan obtained earlier today MEDICATIONS: None ANESTHESIA/SEDATION: None COMPLICATIONS: None immediate. TECHNIQUE: A gentle hand injection of contrast material was performed. Contrast layers within the pelvic cul-de-sac. There is no evidence of fistulous communication with  the nearby rectum or bladder. The injected contrast was aspirated. The catheter was then removed in the standard fashion. PROCEDURE: A gentle hand injection of contrast material was performed. Contrast layers within the pelvic cul-de-sac. There is no evidence of fistulous communication with the nearby rectum or bladder. The injected contrast was aspirated. The catheter was then removed in the standard fashion. IMPRESSION: 1. Result abscess cavity without evidence of fistula. 2. Removal of percutaneous drainage catheter. Electronically Signed   By: Jacqulynn Cadet M.D.   On: 12/03/2019 18:12   US RENAL  Result Date: 12/25/2019 CLINICAL DATA:  Hydronephrosis. EXAM: RENAL / URINARY TRACT ULTRASOUND COMPLETE COMPARISON:  November 12, 2019 FINDINGS: Right Kidney: Renal measurements: 11.3 x 6.4 x 7.9 cm = volume: 300 mL. Marked hydronephrosis. Increased echogenicity of the renal cortex. Left Kidney: Renal measurements: 10.8 x 9.7 x 10.2  cm = volume: 559 mL. Marked hydronephrosis. Increased echogenicity of the renal cortex. Echogenic material within the lower pole calices may represent residual contrast or inspissated debris/blood products. Stent artifact is seen in the left renal pelvis. Bladder: Mildly distended around urinary Foley. Again seen is isoechoic avascular material within the dependent portion of the urinary bladder. Other: None. IMPRESSION: 1. Marked bilateral hydronephrosis, worse on the left. 2. Increased cortical echogenicity of the kidneys, consistent with medical renal disease. 3. Left ureteral stent is seen. The right ureteral stent is not seen sonographically. 4. Mildly distended urinary bladder around urinary Foley with isoechoic avascular material within the dependent portion of the urinary bladder, possibly representing debris or organizing blood products. 5. Increased echogenicity within the left lower pole renal calices may represent blood products or inspissated debris. Electronically Signed   By: Fidela Salisbury M.D.   On: 12/25/2019 16:58   DG C-Arm 1-60 Min-No Report  Result Date: 12/25/2019 Fluoroscopy was utilized by the requesting physician.  No radiographic interpretation.   CT Renal Stone Study  Addendum Date: 12/24/2019   ADDENDUM REPORT: 12/24/2019 12:35 ADDENDUM: The possibility of a leak in the posterior urinary bladder in this patient thought to have a fistula was again discussed with the provider. Additionally, the presence of marked bilateral hydronephrosis, worse on the RIGHT also was discussed. Perhaps a cystogram prior to stented exchange if will not significantly delay care could be helpful for further management as clinically warranted. This would avoid the confounding effect of contrast which could be introduced during stent exchange. Urologic consultation was suggested. These results were called by telephone at the time of interpretation on 12/24/2019 at 12:34 pm to provider [facility] ,  who verbally acknowledged these results. Electronically Signed   By: Zetta Bills M.D.   On: 12/24/2019 12:35   Result Date: 12/24/2019 CLINICAL DATA:  Flank pain, kidney stone suspected EXAM: CT ABDOMEN AND PELVIS WITHOUT CONTRAST TECHNIQUE: Multidetector CT imaging of the abdomen and pelvis was performed following the standard protocol without IV contrast. COMPARISON:  12/03/2019 FINDINGS: Lower chest: Lung bases are clear. No consolidation. No pleural effusion. Hepatobiliary: Liver grossly normal.  Post cholecystectomy. Pancreas: No peripancreatic stranding or ductal dilation. Spleen: Spleen normal in size and contour. Adrenals/Urinary Tract: Adrenal glands are normal. Moderate to marked bilateral collecting system and renal pelvic distension increased when compared to 12/03/2019. Ureteral stents are in place, proximal loops within the area of the renal pelvis bilaterally, distal loops within the urinary bladder. Gas within the urinary bladder and thickened wall. Perivesical fluid collection with reaccumulation of some fluid following drain removal within the rectoprostatic region measuring 6.6  x 2.3 x 6.2 cm. Foley catheter remains in place. Stomach/Bowel: Post gastric bypass procedure. No significant colonic thickening normal appendix. Vascular/Lymphatic: Aortic atheromatous plaque. No aneurysmal dilation. There is no gastrohepatic or hepatoduodenal ligament lymphadenopathy. No retroperitoneal or mesenteric lymphadenopathy. No pelvic sidewall lymphadenopathy. Reproductive: Fluid collection about the bladder. Prostate not well evaluated. Other: No free air. Postoperative changes associated with prior herniorrhaphy in the LEFT lower quadrant. Musculoskeletal: Signs of prior trauma to the RIGHT and LEFT chest with rib fractures of varying ages. No acute musculoskeletal process or destructive bone finding. IMPRESSION: 1. Reaccumulation of small to moderate volume of fluid above the urinary bladder in this  patient with suspected defect in the urinary bladder. No adjacent colonic thickening. Study limited by lack of rectal or vesicle contrast with respect to detection of colovesical fistula though the fat plane appears reasonably well preserved between the collection in the colon on the current study. 2. Interval development of moderate to marked bilateral hydroureteronephrosis despite presence of bilateral ureteral stents. 3. Aortic atherosclerosis. Call is out to the referring provider to further discuss findings in the above case. Aortic Atherosclerosis (ICD10-I70.0). Electronically Signed: By: Zetta Bills M.D. On: 12/24/2019 12:18   IR URETERAL STENT PLACEMENT EXISTING ACCESS RIGHT  Result Date: 12/08/2019 CLINICAL DATA:  Colovesical fistula. Left retrograde ureteral stent. Right nephrostomy catheter. EXAM: RIGHT PERCUTANEOUS NEPHROSTOMY CATHETER EXCHANGE FOR INTERNAL URETERAL STENT UNDER FLUOROSCOPY FLUOROSCOPY TIME:  3 minutes 36 seconds; 15 mGy TECHNIQUE: The procedure, risks (including but not limited to bleeding, infection, organ damage), benefits, and alternatives were explained to the patient. Questions regarding the procedure were encouraged and answered. The patient understands and consents to the procedure. Intravenous dilaudid 0.5 mg and Versed 1mg  were administered as conscious sedation during continuous monitoring of the patient's level of consciousness and physiological / cardiorespiratory status by the radiology RN, with a total moderate sedation time of 12 minutes. As antibiotic prophylaxis, Cipro 400 mg was ordered pre-procedure and administered intravenously within one hour of incision. The nephrostomy tube and surrounding skin were prepped with Betadine, draped in usual sterile fashion. Maximal barrier sterile technique was utilized including caps, mask, sterile gowns, sterile gloves, sterile drape, hand hygiene and skin antiseptic. Skin surrounding the catheter was infiltrated with 1%  lidocaine. A small amount of contrast was injected through the right nephrostomy catheter to opacify the renal collecting system. The catheter was cut and exchanged over a 0.035" angiographic wire for a 5-French Kumpe catheter, advanced into the urinary bladder with the aid of an angled Glidewire. The Kumpe was exchanged over an Amplatz wire for a 24 cm 8 French double-J ureteral stent, deployed with the distal end in the lumen of the urinary bladder, proximal end formed centrally in the right renal collecting system. Contrast injection confirms appropriate positioning and patency. There is no evidence of clot or other complication. Percutaneous access was therefore removed. The patient tolerated the procedure well, with no immediate complication. IMPRESSION: 1. Technically successful exchange of right nephrostomy catheter for an 8-French 24 cm double-J ureteral stent under fluoroscopy . Electronically Signed   By: Lucrezia Europe M.D.   On: 12/08/2019 16:54    Labs:  CBC: Recent Labs    12/24/19 1023 12/24/19 1023 12/24/19 1851 12/25/19 0217 12/26/19 0553 12/26/19 0714  WBC 7.1  --   --  6.0 4.2 4.0  HGB 8.9*   < > 9.4* 8.6* 6.7* 6.8*  HCT 28.3*   < > 29.4* 27.5* 20.1* 19.9*  PLT 220  --   --  231 204 200   < > = values in this interval not displayed.    COAGS: Recent Labs    10/17/19 1241 10/17/19 1241 10/17/19 1311 11/07/19 1836 11/07/19 1853 11/10/19 1052 12/08/19 1258  INR 1.5*   < > 1.6* 1.2  --  1.3* 1.1  APTT 43*  --  46*  --  31  --   --    < > = values in this interval not displayed.    BMP: Recent Labs    12/24/19 1023 12/25/19 0217 12/25/19 1634 12/26/19 0553  NA 131* 132* 136 136  K 5.5* 4.7 3.5 3.1*  CL 108 106 107 103  CO2 7* 8* 14* 17*  GLUCOSE 99 115* 155* 103*  BUN 81* 86* 91* 85*  CALCIUM 8.0* 7.8* 6.7* 6.0*  CREATININE 7.19* 7.65* 7.55* 7.65*  GFRNONAA 8* 7* 7* 7*  GFRAA 9* 8* 8* 8*    LIVER FUNCTION TESTS: Recent Labs    10/20/19 0618  11/07/19 1836 11/10/19 0932 12/25/19 0217  BILITOT 0.5 0.6 0.4 0.3  AST 26 36 17 44*  ALT 19 53* 41 76*  ALKPHOS 94 180* 111 122  PROT 5.0* 7.5 5.6* 6.7  ALBUMIN 1.6* 2.7* 1.8* 2.7*    TUMOR MARKERS: No results for input(s): AFPTM, CEA, CA199, CHROMGRNA in the last 8760 hours.  Assessment and Plan:  Bilateral hydronephrosis; s/p left ureteral stent placement and attempted but failed right ureteral stent placement: Herbie Saxon, 54 year old male, is tentatively scheduled to be seen 12/27/19 at the [facility] department for image-guided bilateral nephrostomy tube placements.  Risks and benefits of bilateral PCN placement were discussed with the patient including, but not limited to, infection, bleeding, significant bleeding causing loss or decrease in renal function or damage to adjacent structures.   All of the patient's questions were answered, patient is agreeable to proceed.  The patient will be NPO after midnight. AM labs have been ordered. He is not on any blood thinning medications. Current hemoglobin is 6.8 and the patient is receiving a blood transfusion.  Consent signed and in chart.  Thank you for this interesting consult.  I greatly enjoyed meeting KAELAN AMBLE Sr and look forward to participating in their care.  A copy of this report was sent to the requesting provider on this date.  Electronically Signed: Soyla Dryer, AGACNP-BC [phone] 12/26/2019, 3:48 PM   I spent a total of 20 Minutes    in face to face in clinical consultation, greater than 50% of which was counseling/coordinating care for bilateral nephrostomy tube placements.

## 2019-12-26 NOTE — Progress Notes (Signed)
CRITICAL VALUE ALERT  Critical Value:  Calcium 6.0  Date & Time Notified:  12/26/2019 9223  Provider Notified: Dr. Algis Liming notified via page  Orders Received/Actions taken:  Awaiting order.  Patient's nurse also notified.

## 2019-12-26 NOTE — Progress Notes (Addendum)
PROGRESS NOTE   Chase [name]  HKV:[id number]    DOB: [birth year]    DOA: 12/24/2019  PCP: [name]   I have briefly reviewed patients previous medical records in [facility].  Chief Complaint  Patient presents with  . 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  CT abdomen showed severe bilateral hydronephrosis and possible colovesical fistula, creatinine 7.1 (baseline 2.4-2.8).  Admitted for acute on chronic kidney disease, severe anion gap metabolic acidosis, hyperkalemia, bilateral hydronephrosis.  Nephrology and urology consulting.    Assessment & Plan:  Active Problems:   AKI (acute kidney injury) ([place])   Acute on stage IV chronic kidney disease Nephrology consulting.  Baseline creatinine 2.4-2.8.  Presented with creatinine of 7.1, potassium 5.5 and bicarbonate 7.  CT abdomen shows bilateral severe hydronephrosis.  Some myoclonic jerks and asterixis may be related to early uremia.  Foley catheter in place.  Suspecting obstruction as the primary issue.  On 9/25 s/p left ureteral stent replaced but could not get right ureteral stent.  Now bilateral nephrostomy tube placement by IR planned for 9/27.  Creatinine essentially plateaued in the 7.5-7.6 range in the last 24 hours.  On reduced IV bicarbonate drip.  Follow BMP in a.m.  Hyperkalemia: Resolved.  Now hypokalemic/3.1.  Anion gap metabolic acidosis: Remains on bicarbonate drip, rate reduced.  Bicarbonate is improved from 7 on admission to 17 today.  Follow BMP in  a.m.  Bilateral hydronephrosis/ureteral stent/hematuria: Urology input appreciated. On 9/25 s/p left ureteral stent replaced but could not get right ureteral stent.  Now bilateral nephrostomy tube placement by IR planned for 9/27.    Possible complicated UTI: In a patient with recent urological interventions, dysuria although he is afebrile and without leukocytosis.  Previous urine cultures had shown E. coli and Pseudomonas.  On IV Zosyn currently.  Urine culture shows 70 K colonies of yeast,?  Do we treat.  Will discuss with ID.  Acute blood loss anemia complicating chronic microcytic anemia Hemoglobin has dropped from 8 g range to 6.7-6.8, confirmed on redraw.  Transfusing 2 units PRBCs.  Anxiety and depression: Cymbalta on hold due to renal insufficiency  History of CVA with residual left hemiparesis: SNF resident.  Therapies evaluation when able.  Body mass index is 23.01 kg/m.  Nutritional Status Nutrition Problem: Inadequate oral intake Etiology: acute illness (bilateral hydronephrosis) Signs/Symptoms: meal completion < 25% Interventions: Ensure Enlive (each supplement provides 350kcal and 20 grams of protein), MVI, Juven  DVT prophylaxis: SCDs Start: 12/24/19 1538     Code Status: Full Code  Family Communication: I was able to speak to one of his sons who lives in [place], updated care and answered all questions.  He was somewhat upset that the family did not know that the patient had been hospitalized until yesterday.  He would like periodic updates as needed. Disposition:  Status is: Inpatient  Remains inpatient appropriate because:Inpatient level of care appropriate due to severity of illness   Dispo: The patient is from: Home              Anticipated d/c is to: SNF  Anticipated d/c date is: > 3 days              Patient currently is not medically stable to d/c.        Consultants:   Nephrology Urology IR  Procedures:   As  above  Antimicrobials:    Anti-infectives (From admission, onward)   Start     Dose/Rate Route Frequency Ordered Stop   12/25/19 1600  piperacillin-tazobactam (ZOSYN) IVPB 2.25 g        2.25 g 100 mL/hr over 30 Minutes Intravenous Every 8 hours 12/25/19 1226     12/25/19 1132  ceFAZolin (ANCEF) 2-4 GM/100ML-% IVPB       Note to Pharmacy: [place]  : cabinet override      12/25/19 1132 12/25/19 2344        Subjective:  No specific complaints.  Denies pain.  Denies vomiting or black stools.  Shoulder couple of drops of bright red blood at tip of penis.  Objective:   Vitals:   12/25/19 1958 12/25/19 2046 12/26/19 0500 12/26/19 0533  BP:  109/66  (!) 98/55  Pulse:  70  66  Resp:  20  20  Temp:  98.1 F (36.7 C)  98.5 F (36.9 C)  TempSrc:  Oral  Oral  SpO2:  100%  100%  Weight:   58.9 kg   Height: 5\' 3"  (1.6 m)       General exam: Middle-age male, moderately built, chronically ill looking lying comfortably propped up in bed.  Oral mucosa with borderline hydration Respiratory system: Clear to auscultation. Respiratory effort normal. Cardiovascular system: S1 & S2 heard, RRR. No JVD, murmurs, rubs, gallops or clicks. No pedal edema.  Telemetry personally reviewed: Sinus rhythm. Gastrointestinal system: Abdomen is nondistended, soft and nontender. No organomegaly or masses felt. Normal bowel sounds heard. GU: Foley catheter in place and urine in bag is dark amber-colored. Central nervous system: Alert and oriented. No focal neurological deficits. Extremities: Symmetric 5 x 5 power. Skin: No rashes, lesions or ulcers.  Occasional myoclonic jerks noted. Psychiatry: Judgement and insight appear normal. Mood & affect appropriate.     Data Reviewed:   I have personally reviewed following labs and imaging studies   CBC: Recent Labs  Lab 12/24/19 1023 12/24/19 1851 12/25/19 0217 12/26/19 0553 12/26/19 0714  WBC 7.1  --  6.0 4.2 4.0  NEUTROABS 5.6  --   --   --    --   HGB 8.9*   < > 8.6* 6.7* 6.8*  HCT 28.3*   < > 27.5* 20.1* 19.9*  MCV 97.6  --  99.6 92.2 92.1  PLT 220  --  231 204 200   < > = values in this interval not displayed.    Basic Metabolic Panel: Recent Labs  Lab 12/25/19 0217 12/25/19 1634 12/26/19 0553  NA 132* 136 136  K 4.7 3.5 3.1*  CL 106 107 103  CO2 8* 14* 17*  GLUCOSE 115* 155* 103*  BUN 86* 91* 85*  CREATININE 7.65* 7.55* 7.65*  CALCIUM 7.8* 6.7* 6.0*    Liver Function Tests: Recent Labs  Lab 12/25/19 0217  AST 44*  ALT 76*  ALKPHOS 122  BILITOT 0.3  PROT 6.7  ALBUMIN 2.7*    CBG: No results for input(s): GLUCAP in the last 168 hours.  Microbiology Studies:   Recent Results (from the past 240 hour(s))  Urine culture     Status: Abnormal   Collection Time: 12/24/19 10:33 AM  Specimen: Urine, Clean Catch  Result Value Ref Range Status   Specimen Description   Final    URINE, CLEAN CATCH Performed at [facility], [place] [address] [place], [place] [address]    Special Requests   Final    NONE Performed at [facility], [place] [address], [place], [place] [address]    Culture 70,000 COLONIES/mL YEAST (A)  Final   Report Status 12/26/2019 FINAL  Final  Respiratory Panel by RT PCR (Flu A&B, Covid) - Nasopharyngeal Swab     Status: None   Collection Time: 12/24/19  2:22 PM   Specimen: Nasopharyngeal Swab  Result Value Ref Range Status   SARS Coronavirus 2 by RT PCR NEGATIVE NEGATIVE Final    Comment: (NOTE) SARS-CoV-2 target nucleic acids are NOT DETECTED.  The SARS-CoV-2 RNA is generally detectable in upper respiratoy specimens during the acute phase of infection. The lowest concentration of SARS-CoV-2 viral copies this assay can detect is 131 copies/mL. A negative result does not preclude SARS-Cov-2 infection and should not be used as the sole basis for treatment or other patient management decisions. A negative result may occur with  improper specimen  collection/handling, submission of specimen other than nasopharyngeal swab, presence of viral mutation(s) within the areas targeted by this assay, and inadequate number of viral copies (<131 copies/mL). A negative result must be combined with clinical observations, patient history, and epidemiological information. The expected result is Negative.  Fact Sheet for Patients:  PinkCheek.be  Fact Sheet for Healthcare Providers:  GravelBags.it  This test is no t yet approved or cleared by the [place] FDA and  has been authorized for detection and/or diagnosis of SARS-CoV-2 by FDA under an Emergency Use Authorization (EUA). This EUA will remain  in effect (meaning this test can be used) for the duration of the COVID-19 declaration under Section 564(b)(1) of the Act, 21 U.S.C. section 360bbb-3(b)(1), unless the authorization is terminated or revoked sooner.     Influenza A by PCR NEGATIVE NEGATIVE Final   Influenza B by PCR NEGATIVE NEGATIVE Final    Comment: (NOTE) The Xpert Xpress SARS-CoV-2/FLU/RSV assay is intended as an aid in  the diagnosis of influenza from Nasopharyngeal swab specimens and  should not be used as a sole basis for treatment. Nasal washings and  aspirates are unacceptable for Xpert Xpress SARS-CoV-2/FLU/RSV  testing.  Fact Sheet for Patients: PinkCheek.be  Fact Sheet for Healthcare Providers: GravelBags.it  This test is not yet approved or cleared by the [place] FDA and  has been authorized for detection and/or diagnosis of SARS-CoV-2 by  FDA under an Emergency Use Authorization (EUA). This EUA will remain  in effect (meaning this test can be used) for the duration of the  Covid-19 declaration under Section 564(b)(1) of the Act, 21  U.S.C. section 360bbb-3(b)(1), unless the authorization is  terminated or revoked. Performed at [facility], [place] [address] [place], [place] [address]      Radiology Studies:  US RENAL  Result Date: 12/31/2019 CLINICAL DATA:  Hydronephrosis. EXAM: RENAL / URINARY TRACT ULTRASOUND COMPLETE COMPARISON:  November 12, 2019 FINDINGS: Right Kidney: Renal measurements: 11.3 x 6.4 x 7.9 cm = volume: 300 mL. Marked hydronephrosis. Increased echogenicity of the renal cortex. Left Kidney: Renal measurements: 10.8 x 9.7 x 10.2 cm = volume: 559 mL. Marked hydronephrosis. Increased echogenicity of the renal cortex. Echogenic material within the lower pole calices may represent residual contrast or inspissated debris/blood products. Stent artifact  is seen in the left renal pelvis. Bladder: Mildly distended around urinary Foley. Again seen is isoechoic avascular material within the dependent portion of the urinary bladder. Other: None. IMPRESSION: 1. Marked bilateral hydronephrosis, worse on the left. 2. Increased cortical echogenicity of the kidneys, consistent with medical renal disease. 3. Left ureteral stent is seen. The right ureteral stent is not seen sonographically. 4. Mildly distended urinary bladder around urinary Foley with isoechoic avascular material within the dependent portion of the urinary bladder, possibly representing debris or organizing blood products. 5. Increased echogenicity within the left lower pole renal calices may represent blood products or inspissated debris. Electronically Signed   By: Fidela Salisbury M.D.   On: 12/25/2019 16:58   DG C-Arm 1-60 Min-No Report  Result Date: 12/25/2019 Fluoroscopy was utilized by the requesting physician.  No radiographic interpretation.     Scheduled Meds:   . sodium chloride   Intravenous Once  . Chlorhexidine Gluconate Cloth  6 each Topical Daily  . feeding supplement (ENSURE ENLIVE)  237 mL Oral TID BM  . multivitamin with minerals  1 tablet Oral Daily  . nicotine  14 mg Transdermal Daily  . nutrition supplement (JUVEN)   1 packet Oral BID BM  . tamsulosin  0.4 mg Oral Daily    Continuous Infusions:   . piperacillin-tazobactam (ZOSYN)  IV 2.25 g (12/26/19 1110)  . sodium bicarbonate (isotonic) 150 mEq in D5W 1000 mL infusion 50 mL/hr at 12/26/19 1058     LOS: 2 days     Vernell Leep, MD, [place], [facility]. [facility]    To contact the attending provider between 7A-7P or the covering provider during after hours 7P-7A, please log into the web site [email or web address] and access using universal [place] password for that web site. If you do not have the password, please call the hospital operator.  12/26/2019, 1:31 PM

## 2019-12-27 ENCOUNTER — Inpatient Hospital Stay (HOSPITAL_COMMUNITY): Payer: No Typology Code available for payment source

## 2019-12-27 DIAGNOSIS — Z7189 Other specified counseling: Secondary | ICD-10-CM

## 2019-12-27 DIAGNOSIS — Z515 Encounter for palliative care: Secondary | ICD-10-CM

## 2019-12-27 HISTORY — PX: IR NEPHROSTOMY PLACEMENT LEFT: IMG6063

## 2019-12-27 HISTORY — PX: IR NEPHROSTOMY PLACEMENT RIGHT: IMG6064

## 2019-12-27 LAB — BASIC METABOLIC PANEL
Anion gap: 17 — ABNORMAL HIGH (ref 5–15)
BUN: 97 mg/dL — ABNORMAL HIGH (ref 6–20)
CO2: 19 mmol/L — ABNORMAL LOW (ref 22–32)
Calcium: 6.3 mg/dL — CL (ref 8.9–10.3)
Chloride: 101 mmol/L (ref 98–111)
Creatinine, Ser: 7.98 mg/dL — ABNORMAL HIGH (ref 0.61–1.24)
GFR calc Af Amer: 8 mL/min — ABNORMAL LOW (ref >60)
GFR calc non Af Amer: 7 mL/min — ABNORMAL LOW (ref >60)
Glucose, Bld: 96 mg/dL (ref 70–99)
Potassium: 3.3 mmol/L — ABNORMAL LOW (ref 3.5–5.1)
Sodium: 137 mmol/L (ref 135–145)

## 2019-12-27 LAB — CBC
HCT: 28.2 % — ABNORMAL LOW (ref 39.0–52.0)
Hemoglobin: 9.7 g/dL — ABNORMAL LOW (ref 13.0–17.0)
MCH: 30.9 pg (ref 26.0–34.0)
MCHC: 34.4 g/dL (ref 30.0–36.0)
MCV: 89.8 fL (ref 80.0–100.0)
Platelets: 199 10*3/uL (ref 150–400)
RBC: 3.14 MIL/uL — ABNORMAL LOW (ref 4.22–5.81)
RDW: 17.2 % — ABNORMAL HIGH (ref 11.5–15.5)
WBC: 6.4 10*3/uL (ref 4.0–10.5)
nRBC: 0 % (ref 0.0–0.2)

## 2019-12-27 LAB — PROTIME-INR
INR: 1.3 — ABNORMAL HIGH (ref 0.8–1.2)
Prothrombin Time: 15.9 seconds — ABNORMAL HIGH (ref 11.4–15.2)

## 2019-12-27 LAB — PREALBUMIN: Prealbumin: 14.5 mg/dL — ABNORMAL LOW (ref 18–38)

## 2019-12-27 MED ORDER — LIDOCAINE HCL (PF) 1 % IJ SOLN
INTRAMUSCULAR | Status: AC | PRN
  Administered 2019-12-27 (×2): 5 mL

## 2019-12-27 MED ORDER — SODIUM CHLORIDE 0.9 % IV SOLN: INTRAVENOUS | Status: DC

## 2019-12-27 MED ORDER — LIDOCAINE HCL 1 % IJ SOLN
INTRAMUSCULAR | Status: AC
  Filled 2019-12-27: qty 20

## 2019-12-27 MED ORDER — CIPROFLOXACIN IN D5W 400 MG/200ML IV SOLN
INTRAVENOUS | Status: AC
  Administered 2019-12-27: 400 mg
  Filled 2019-12-27: qty 200

## 2019-12-27 MED ORDER — MIDAZOLAM HCL 2 MG/2ML IJ SOLN
INTRAMUSCULAR | Status: AC
  Filled 2019-12-27: qty 4

## 2019-12-27 MED ORDER — SODIUM BICARBONATE 650 MG PO TABS
1300.0000 mg | ORAL_TABLET | Freq: Two times a day (BID) | ORAL | Status: DC
  Administered 2019-12-27 – 2020-01-06 (×22): 1300 mg via ORAL
  Filled 2019-12-27 (×22): qty 2

## 2019-12-27 MED ORDER — FENTANYL CITRATE (PF) 100 MCG/2ML IJ SOLN
INTRAMUSCULAR | Status: AC | PRN
  Administered 2019-12-27: 50 ug via INTRAVENOUS

## 2019-12-27 MED ORDER — IOHEXOL 300 MG/ML  SOLN
50.0000 mL | Freq: Once | INTRAMUSCULAR | Status: AC | PRN
  Administered 2019-12-27: 15 mL

## 2019-12-27 MED ORDER — HYDROCODONE-ACETAMINOPHEN 5-325 MG PO TABS
1.0000 | ORAL_TABLET | ORAL | Status: DC | PRN
  Administered 2019-12-27 – 2019-12-28 (×6): 2 via ORAL
  Administered 2019-12-28: 1 via ORAL
  Administered 2019-12-28 – 2019-12-29 (×4): 2 via ORAL
  Administered 2019-12-29: 1 via ORAL
  Administered 2019-12-29: 2 via ORAL
  Administered 2019-12-30: 1 via ORAL
  Administered 2019-12-30 – 2020-01-08 (×46): 2 via ORAL
  Filled 2019-12-27 (×12): qty 2
  Filled 2019-12-27: qty 1
  Filled 2019-12-27 (×3): qty 2
  Filled 2019-12-27 (×2): qty 1
  Filled 2019-12-27 (×9): qty 2
  Filled 2019-12-27: qty 1
  Filled 2019-12-27 (×32): qty 2
  Filled 2019-12-27: qty 1
  Filled 2019-12-27 (×3): qty 2

## 2019-12-27 MED ORDER — FLUCONAZOLE 100 MG PO TABS: 400.0000 mg | ORAL_TABLET | Freq: Every day | ORAL | Status: DC

## 2019-12-27 MED ORDER — FENTANYL CITRATE (PF) 100 MCG/2ML IJ SOLN
INTRAMUSCULAR | Status: AC
  Filled 2019-12-27: qty 4

## 2019-12-27 MED ORDER — CALCIUM GLUCONATE-NACL 1-0.675 GM/50ML-% IV SOLN
1.0000 g | Freq: Once | INTRAVENOUS | Status: AC
  Administered 2019-12-27: 1000 mg via INTRAVENOUS
  Filled 2019-12-27: qty 50

## 2019-12-27 MED ORDER — SODIUM CHLORIDE 0.9% FLUSH
5.0000 mL | Freq: Three times a day (TID) | INTRAVENOUS | Status: DC
  Administered 2019-12-27 – 2020-01-07 (×21): 5 mL

## 2019-12-27 MED ORDER — FLUCONAZOLE 100 MG PO TABS
200.0000 mg | ORAL_TABLET | Freq: Every day | ORAL | Status: DC
  Filled 2019-12-27: qty 2

## 2019-12-27 MED ORDER — POTASSIUM CHLORIDE CRYS ER 10 MEQ PO TBCR
10.0000 meq | EXTENDED_RELEASE_TABLET | Freq: Once | ORAL | Status: AC
  Administered 2019-12-27: 10 meq via ORAL
  Filled 2019-12-27: qty 1

## 2019-12-27 MED ORDER — MIDAZOLAM HCL 2 MG/2ML IJ SOLN
INTRAMUSCULAR | Status: AC | PRN
  Administered 2019-12-27: 1 mg via INTRAVENOUS

## 2019-12-27 MED ORDER — FLUCONAZOLE 100 MG PO TABS
200.0000 mg | ORAL_TABLET | Freq: Every day | ORAL | Status: AC
  Administered 2019-12-27 – 2019-12-31 (×5): 200 mg via ORAL
  Filled 2019-12-27 (×4): qty 2

## 2019-12-27 NOTE — Consult Note (Signed)
Palliative care consult note  Reason for consult: Goals of care in light of chronic kidney disease with AKI likely related to obstruction  Palliative care consult received.  Chart reviewed including personal review of pertinent labs and imaging.  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  CT revealed bilateral hydronephrosis and possible colovesical fistula with elevated creatinine.  He is currently admitted for acute on chronic kidney disease, acidosis, hyperkalemia and bilateral hydronephrosis.  Urology and nephrology have consulted and plan is for bilateral nephrostomy tube placement as suspected acute renal impairment likely due to obstructive process.  Palliative consulted for goals of care.  I met with Chase [name] today.  We discussed his clinical course this admission as well as his goals moving forward.  I introduced palliative care as specialized medical care for people living with serious illness. It focuses on providing relief from the symptoms and stress of a serious illness. The goal is to improve quality of life for both the patient and the family.  He reports that he does not have any questions at this time and the doctors have been doing a good job explaining things to him.  We discussed plan for bilateral nephrostomy tube placement tomorrow and most of his focus is on the expected course following this for him to go to get back to [facility] for continued rehab.  He is very focused on regaining as much strength and functional status as he can.  He reports that his goal is to be able to move back to his home when he finishes rehab.  He is very motivated and understands that the more he is in the hospital and unable to  participate in rehab, the more functional status and strength he is going to lose.  Recommendations: - Full code/full scope treatment - Chase [name] is open to any and all interventions that may prolong his life at this point in time.  He is focused on transitioning back to rehab and trying to regain functional status with an eventual goal of returning home. -Palliative medicine team will continue to shadow peripherally in case there are further needs if he does not improve following bilateral nephrostomy tube placement.  At this point, however, PMT will not plan on rounding on Chase [name] moving forward.  If we can be of further assistance in his care, please do not hesitate to call or reconsult.  Total time: 45 minutes  Greater than 50%  of this time was spent counseling and coordinating care related to the above assessment and plan.  Micheline Rough, MD [place] Team [phone]

## 2019-12-27 NOTE — Plan of Care (Signed)

## 2019-12-27 NOTE — Progress Notes (Signed)
PROGRESS NOTE   Chase [name]  UUV:[id number]    DOB: 02/11/1966    DOA: 12/24/2019  PCP: [name]   I have briefly reviewed patients previous medical records in [facility].  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  CT abdomen showed severe bilateral hydronephrosis and possible colovesical fistula, creatinine 7.1 (baseline 2.4-2.8).  Admitted for acute on chronic kidney disease, severe anion gap metabolic acidosis, hyperkalemia, bilateral hydronephrosis.  Nephrology and urology consulting.  9/25, s/p left ureteral stent replaced but could not get right ureteral stent.  Bilateral percutaneous nephrostomies by IR planned for 9/27.    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  Baseline creatinine 2.4-2.8.  Presented with creatinine of 7.1, potassium 5.5 and bicarbonate 7.  CT abdomen shows bilateral severe hydronephrosis.  Foley catheter in place.  Suspecting obstruction as the primary issue.  On 9/25 per Urology s/p left ureteral stent replaced but could not get right ureteral stent.  Now bilateral nephrostomy tube placement by IR planned for 9/27.  Creatinine steadily increasing since admission, 7.98 today.  Nephrology follow-up appreciated and indicated that he may need HD in the next 24 to 48 hours.  Bicarb drip changed to IV normal saline and oral bicarbonate  initiated.  Hyperkalemia/hypokalemia: Hyperkalemia resolved.  Nephrology replacing low-dose potassium for hypokalemia.  Anion gap metabolic acidosis: Bicarbonate drip discontinued and started oral bicarbonate 9/27.  Hypocalcemia: Nephrology replacing with IV calcium gluconate.  Bilateral hydronephrosis/ureteral stent/hematuria: Urology input appreciated. On 9/25 s/p left ureteral stent replaced but could not get right ureteral stent.  Now bilateral nephrostomy tube placement by IR planned for 9/27.    Possible complicated UTI: In a patient with recent urological interventions, dysuria although he is afebrile and without leukocytosis.  Previous urine cultures had shown E. coli and Pseudomonas.  Patient was started on IV Zosyn.  However urine culture now shows 70 K colonies of yeast.  I discussed with infectious disease MD on call who recommended starting fluconazole and IV Zosyn discontinued.  Acute blood loss anemia complicating chronic microcytic anemia Hemoglobin has dropped from 8 g range to 6.7-6.8, confirmed on redraw.  S/p 2 units PRBC and hemoglobin up to 9.7.  Follow CBC in a.m.  Anxiety and depression: Cymbalta on hold due to renal insufficiency  History of CVA with residual left hemiparesis: SNF resident.  Therapies evaluation when able.  Body mass index is 21.49 kg/m.  Nutritional Status Nutrition Problem: Inadequate oral intake Etiology: acute illness (bilateral hydronephrosis) Signs/Symptoms: meal completion < 25% Interventions: Ensure Enlive (each supplement provides 350kcal and 20 grams of protein), MVI, Juven  DVT prophylaxis: SCDs Start: 12/24/19 1538     Code Status: Full Code  Family Communication: I was able to speak to one of his sons who lives in [place] on 9/26, updated care and answered all questions.  He was somewhat upset that the family did not know that the patient had been hospitalized until yesterday.  He would like  periodic updates as  needed. Disposition:  Status is: Inpatient  Remains inpatient appropriate because:Inpatient level of care appropriate due to severity of illness   Dispo: The patient is from: Home              Anticipated d/c is to: SNF              Anticipated d/c date is: > 3 days              Patient currently is not medically stable to d/c.        Consultants:   Nephrology Urology IR  Procedures:   As above  Antimicrobials:    Anti-infectives (From admission, onward)   Start     Dose/Rate Route Frequency Ordered Stop   12/27/19 1300  fluconazole (DIFLUCAN) tablet 400 mg  Status:  Discontinued        400 mg Oral Daily 12/27/19 1213 12/27/19 1214   12/27/19 1300  fluconazole (DIFLUCAN) tablet 200 mg        200 mg Oral Daily 12/27/19 1214     12/25/19 1600  piperacillin-tazobactam (ZOSYN) IVPB 2.25 g  Status:  Discontinued        2.25 g 100 mL/hr over 30 Minutes Intravenous Every 8 hours 12/25/19 1226 12/27/19 1213   12/25/19 1132  ceFAZolin (ANCEF) 2-4 GM/100ML-% IVPB       Note to Pharmacy: [place]  : cabinet override      12/25/19 1132 12/25/19 2344        Subjective:  Patient seen this morning.  Disappointed that he has to be NPO for procedure until this afternoon but understands the reason.  No pain reported at that time or any other complaints.  No dyspnea, nausea or vomiting.  Objective:   Vitals:   12/26/19 2125 12/26/19 2151 12/27/19 0051 12/27/19 0626  BP: 102/62 105/68 106/67 (!) 106/53  Pulse: 65 66 60 (!) 50  Resp: 14 12 14 18   Temp: 98.5 F (36.9 C) 98.7 F (37.1 C) 98.7 F (37.1 C) 98.1 F (36.7 C)  TempSrc: Oral Oral Oral Oral  SpO2: 100% 100% 100% 99%  Weight:    55 kg  Height:        General exam: Middle-age male, moderately built, chronically ill looking lying comfortably propped up in bed.  Oral mucosa moist.  Looks better than he did yesterday. Respiratory system: Clear to auscultation.  No increased work of breathing. Cardiovascular  system: S1 & S2 heard, RRR. No JVD, murmurs, rubs, gallops or clicks. No pedal edema.  Telemetry personally reviewed: Sinus bradycardia in the 50s-sinus rhythm in the 60s. Gastrointestinal system: Abdomen is nondistended, soft and nontender. No organomegaly or masses felt. Normal bowel sounds heard. Central nervous system: Alert and oriented. No focal neurological deficits. Extremities: Symmetric 5 x 5 power. Skin: No rashes, lesions or ulcers.  Did not witness any myoclonic jerks today. Psychiatry: Judgement and insight appear normal. Mood & affect appropriate.     Data Reviewed:   I have personally reviewed following labs and imaging studies   CBC: Recent Labs  Lab 12/24/19 1023 12/24/19 1851 12/26/19 0553 12/26/19 0714 12/27/19 0450  WBC 7.1   < > 4.2 4.0 6.4  NEUTROABS 5.6  --   --   --   --   HGB 8.9*   < > 6.7* 6.8* 9.7*  HCT 28.3*   < > 20.1* 19.9* 28.2*  MCV 97.6   < > 92.2 92.1 89.8  PLT 220   < >  204 200 199   < > = values in this interval not displayed.    Basic Metabolic Panel: Recent Labs  Lab 12/25/19 1634 12/26/19 0553 12/27/19 0450  NA 136 136 137  K 3.5 3.1* 3.3*  CL 107 103 101  CO2 14* 17* 19*  GLUCOSE 155* 103* 96  BUN 91* 85* 97*  CREATININE 7.55* 7.65* 7.98*  CALCIUM 6.7* 6.0* 6.3*    Liver Function Tests: Recent Labs  Lab 12/25/19 0217  AST 44*  ALT 76*  ALKPHOS 122  BILITOT 0.3  PROT 6.7  ALBUMIN 2.7*    CBG: No results for input(s): GLUCAP in the last 168 hours.  Microbiology Studies:   Recent Results (from the past 240 hour(s))  Urine culture     Status: Abnormal   Collection Time: 12/24/19 10:33 AM   Specimen: Urine, Clean Catch  Result Value Ref Range Status   Specimen Description   Final    URINE, CLEAN CATCH Performed at [facility], [place] [address] [place], [place] [address]    Special Requests   Final    NONE Performed at [facility], [place] [address], [place], [place]  [address]    Culture 70,000 COLONIES/mL YEAST (A)  Final   Report Status 12/26/2019 FINAL  Final  Respiratory Panel by RT PCR (Flu A&B, Covid) - Nasopharyngeal Swab     Status: None   Collection Time: 12/24/19  2:22 PM   Specimen: Nasopharyngeal Swab  Result Value Ref Range Status   SARS Coronavirus 2 by RT PCR NEGATIVE NEGATIVE Final    Comment: (NOTE) SARS-CoV-2 target nucleic acids are NOT DETECTED.  The SARS-CoV-2 RNA is generally detectable in upper respiratoy specimens during the acute phase of infection. The lowest concentration of SARS-CoV-2 viral copies this assay can detect is 131 copies/mL. A negative result does not preclude SARS-Cov-2 infection and should not be used as the sole basis for treatment or other patient management decisions. A negative result may occur with  improper specimen collection/handling, submission of specimen other than nasopharyngeal swab, presence of viral mutation(s) within the areas targeted by this assay, and inadequate number of viral copies (<131 copies/mL). A negative result must be combined with clinical observations, patient history, and epidemiological information. The expected result is Negative.  Fact Sheet for Patients:  PinkCheek.be  Fact Sheet for Healthcare Providers:  GravelBags.it  This test is no t yet approved or cleared by the [place] FDA and  has been authorized for detection and/or diagnosis of SARS-CoV-2 by FDA under an Emergency Use Authorization (EUA). This EUA will remain  in effect (meaning this test can be used) for the duration of the COVID-19 declaration under Section 564(b)(1) of the Act, 21 U.S.C. section 360bbb-3(b)(1), unless the authorization is terminated or revoked sooner.     Influenza A by PCR NEGATIVE NEGATIVE Final   Influenza B by PCR NEGATIVE NEGATIVE Final    Comment: (NOTE) The Xpert Xpress SARS-CoV-2/FLU/RSV assay is intended as an  aid in  the diagnosis of influenza from Nasopharyngeal swab specimens and  should not be used as a sole basis for treatment. Nasal washings and  aspirates are unacceptable for Xpert Xpress SARS-CoV-2/FLU/RSV  testing.  Fact Sheet for Patients: PinkCheek.be  Fact Sheet for Healthcare Providers: GravelBags.it  This test is not yet approved or cleared by the [place] FDA and  has been authorized for detection and/or diagnosis of SARS-CoV-2 by  FDA under an Emergency Use Authorization (EUA). This  EUA will remain  in effect (meaning this test can be used) for the duration of the  Covid-19 declaration under Section 564(b)(1) of the Act, 21  U.S.C. section 360bbb-3(b)(1), unless the authorization is  terminated or revoked. Performed at [facility], [place] [address] [place], [place] [address]      Radiology Studies:  US RENAL  Result Date: 2020/01/11 CLINICAL DATA:  Hydronephrosis. EXAM: RENAL / URINARY TRACT ULTRASOUND COMPLETE COMPARISON:  November 12, 2019 FINDINGS: Right Kidney: Renal measurements: 11.3 x 6.4 x 7.9 cm = volume: 300 mL. Marked hydronephrosis. Increased echogenicity of the renal cortex. Left Kidney: Renal measurements: 10.8 x 9.7 x 10.2 cm = volume: 559 mL. Marked hydronephrosis. Increased echogenicity of the renal cortex. Echogenic material within the lower pole calices may represent residual contrast or inspissated debris/blood products. Stent artifact is seen in the left renal pelvis. Bladder: Mildly distended around urinary Foley. Again seen is isoechoic avascular material within the dependent portion of the urinary bladder. Other: None. IMPRESSION: 1. Marked bilateral hydronephrosis, worse on the left. 2. Increased cortical echogenicity of the kidneys, consistent with medical renal disease. 3. Left ureteral stent is seen. The right ureteral stent is not seen sonographically. 4. Mildly distended  urinary bladder around urinary Foley with isoechoic avascular material within the dependent portion of the urinary bladder, possibly representing debris or organizing blood products. 5. Increased echogenicity within the left lower pole renal calices may represent blood products or inspissated debris. Electronically Signed   By: Fidela Salisbury M.D.   On: 01-11-2020 16:58     Scheduled Meds:    Chlorhexidine Gluconate Cloth  6 each Topical Daily   feeding supplement (ENSURE ENLIVE)  237 mL Oral TID BM   fluconazole  200 mg Oral Daily   multivitamin with minerals  1 tablet Oral Daily   nicotine  14 mg Transdermal Daily   nutrition supplement (JUVEN)  1 packet Oral BID BM   potassium chloride  10 mEq Oral Once   sodium bicarbonate  1,300 mg Oral BID   tamsulosin  0.4 mg Oral Daily    Continuous Infusions:    sodium chloride     calcium gluconate       LOS: 3 days     Vernell Leep, MD, [place], [facility]. [facility]    To contact the attending provider between 7A-7P or the covering provider during after hours 7P-7A, please log into the web site [email or web address] and access using universal [place] password for that web site. If you do not have the password, please call the hospital operator.  12/27/2019, 1:28 PM

## 2019-12-27 NOTE — Progress Notes (Addendum)
[facility] Progress Note  Subjective:  Had 1.5 liters UOP over 9/26 charted.  Urology saw this AM and stated he needed bilateral nephrostomy tube placement today - he is NPO and see nephrostomy tubes are ordered  Review of systems:  Denies shortness of breath  No chest pain  No n/v   Vitals:   12/26/19 2125 12/26/19 2151 12/27/19 0051 12/27/19 0626  BP: 102/62 105/68 106/67 (!) 106/53  Pulse: 65 66 60 (!) 50  Resp: 14 12 14 18   Temp: 98.5 F (36.9 C) 98.7 F (37.1 C) 98.7 F (37.1 C) 98.1 F (36.7 C)  TempSrc: Oral Oral Oral Oral  SpO2: 100% 100% 100% 99%  Weight:    55 kg  Height:        Exam: Gen pale middle-aged WM, chron ill appearing, no distress HENT NCAT  Chest clear bilat to bases unlabored S1S2 no rub Abd soft ntnd no mass or ascites +bs GU foley cath in place Ext no leg or UE edema Neuro is alert, Ox 3; provides hx and follows commands Psych normal mood and affect    Home meds:  - cymbalta 30-60 hs/ neurontin 800 hs/ norco qid prn  - midodrine 2.5- 5 tid prn  - flomax 0.4 qd  - ensure tid bm/ MVI/ atarax prn      CT renal stone study noncon >  IMPRESSION: 1. Reaccumulation of small to moderate volume of fluid above the urinary bladder in this patient with suspected defect in the urinary bladder. No adjacent colonic thickening. Study limited by lack of rectal or vesicle contrast with respect to detection of colovesical fistula though the fat plane appears reasonably well preserved between the collection in the colon on the current study. 2. Interval development of moderate to marked bilateral hydroureteronephrosis despite presence of bilateral ureteral stents. 3. Aortic atherosclerosis.    VS BP 114/ 66, HR 70  RR 18  RA 100%  Temp 98   Na 131 K 5.5  CO2 7  BUN 81  Cr 7.19  Ca 8.0  Hb 8.9  WBC 7k  plt 220   COVID negative   UA many bact >50 rbc/ wbc, 100 prot, turbid     Assessment/ Plan: 1. AoCKD 4 - b/l creat 2.4- 2.8, eGFR 24-  59m/min. AKI presumably due to obstruction w/ new and severe bilat hydronephrosis by imaging.  Urology did procedure 9/25 w/ L ureteral stent replaced but could not get R ureteral stent in position. Has some asterixis but o/w stable, borderline uremic. Making urine.  1. No indication for RRT at this time but may need HD in the next 24 -48 hours 2. For Bilateral nephrostomy tube placement today  3. Ns at 75/hr for now  4. Continue indwelling foley for bladder outlet obstruction as well per urology  2. Met acidosis - improved. Stop bicarb gtt and transition to oral bicarb  3. Hyperkalemia - resolved and now K low on bicarb gtt. Potassium chloride 10 meq PO once. 4. Hematuria/ bilat hydronephrosis/ hx ureteral stents - tortuous ureters per the OP note by urology.  5. Possible UTI - on empiric IV zosyn - per primary team   6. H/o CVA - left hemiparesis, SNF dependent 7. Hypocalcemia - replete with IV calcium gluconate. Stopping bicarb gtt   Recent Labs  Lab 12/26/19 0553 12/26/19 0553 12/26/19 0714 12/27/19 0450  K 3.1*  --   --  3.3*  BUN 85*  --   --  97*  CREATININE 7.65*  --   --  7.98*  CALCIUM 6.0*  --   --  6.3*  HGB 6.7*   < > 6.8* 9.7*   < > = values in this interval not displayed.   Inpatient medications: . Chlorhexidine Gluconate Cloth  6 each Topical Daily  . feeding supplement (ENSURE ENLIVE)  237 mL Oral TID BM  . multivitamin with minerals  1 tablet Oral Daily  . nicotine  14 mg Transdermal Daily  . nutrition supplement (JUVEN)  1 packet Oral BID BM  . tamsulosin  0.4 mg Oral Daily   . piperacillin-tazobactam (ZOSYN)  IV 2.25 g (12/27/19 0842)  . sodium bicarbonate (isotonic) 150 mEq in D5W 1000 mL infusion 50 mL/hr at 12/27/19 0600   acetaminophen **OR** acetaminophen, HYDROcodone-acetaminophen, hydrOXYzine   Claudia Desanctis, MD 12:21 PM 12/27/2019  Note we discussed that if labs continued to worsen that he might need dialysis - he would like to try the neph  tubes first and I agree.    Claudia Desanctis, MD 12/27/2019 12:34 PM

## 2019-12-27 NOTE — Plan of Care (Signed)

## 2019-12-27 NOTE — Progress Notes (Addendum)
Patient ID: Chase [name], male   DOB: 1965/04/13, 54 y.o.   MRN: [id number]  2 Days Post-Op Subjective: Pt very well known to me from prior admission.  He presented in August to [facility] with sepsis after prior admission at [facility] in July due to stroke.  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  He has a history of CKD with baseline Cr of 2.6.  Due to hydronephrosis noted during August admission, he underwent renal drainage.  I was able to place a left ureteral stent although this was extremely difficult due to tortuosity of ureter.  I was unable to place a right ureteral stent for this same reason.  He then underwent right nephrostomy placement (with subsequent internalization of a ureteral stent in September).  His renal function did not improve following renal drainage. He was eventually discharged with plans to allow him to improve his nutritional status prior to repair of colovesical fistula with [name] (General Surgery) and myself.  I spoke with Dr. Gloriann Loan last evening.  Chase [name] presented to the hospital a couple of days ago with worsening renal function.  Due to persistent and potentially worsening hydronephrosis, Dr. Gloriann Loan discussed nephrostomy drainage with Interventional Radiology.  Despite very recent stent placement, IR recommended ureteral stent change first.  As noted previously, this again proved to be extremely difficult and the right ureteral stent could not be replaced.  Furthermore, Dr. Gloriann Loan does not feel the the current left ureteral stent is likely in an appropriate position.  Objective: Vital signs in last 24 hours: Temp:  [98.1 F (36.7 C)-99.2 F (37.3 C)] 98.1 F (36.7 C) (09/27 0626) Pulse Rate:  [50-79] 50 (09/27 0626) Resp:  [12-18] 18 (09/27 0626) BP: (100-106)/(53-68) 106/53 (09/27 0626) SpO2:  [83 %-100 %] 99 % (09/27 0626) Weight:  [55 kg] 55 kg  (09/27 0626)  Intake/Output from previous day: 09/26 0701 - 09/27 0700 In: 2437.6 [P.O.:597; I.V.:991.6; Blood:699; IV Piggyback:150] Out: 1500 [Urine:1500] Intake/Output this shift: No intake/output data recorded.  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  Definitive surgery will be scheduled in future per [name] once patient is medically optimized and nutritional status is improved.  Will need bilateral ureteral stents in proper position to aid with identification during repair.  2) AKI/CKD: Knowing this patient well and being very familiar with his ureteral anatomy as well as the fact that he has continued to have adequate UOP, his AKI during this hospital admission was not likely primarily obstructive in nature initially.  However, considering that he currently does not have ureteral stents in proper position, he will now need bilateral nephrostomy tube placement today (with eventual internalization of ureteral stents to aid with future surgical repair of colovesical fistula).  Would cover with antifungal agent considering yeast bacteruria.  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   LOS: 3  days   [name] 12/27/2019, 7:01 AM

## 2019-12-27 NOTE — Procedures (Signed)
Interventional Radiology Procedure Note  Procedure: Image guided drain placement, bilateral PCN.  4F pigtail drains.  Complications: None  EBL: None Sample: Culture sent, bilateral  Recommendations: - Routine drain care, with record output - follow up Cx - routine wound care  Signed,  Dulcy Fanny. Earleen Newport, DO

## 2019-12-27 NOTE — Progress Notes (Signed)
CRITICAL VALUE ALERT  Critical Value:  [place] 6.3  Date & Time Notied:  12/27/2019  Provider Notified: Kennon Holter NP  Orders Received/Actions taken: notified

## 2019-12-28 LAB — BASIC METABOLIC PANEL
Anion gap: 16 — ABNORMAL HIGH (ref 5–15)
BUN: 98 mg/dL — ABNORMAL HIGH (ref 6–20)
CO2: 21 mmol/L — ABNORMAL LOW (ref 22–32)
Calcium: 6.6 mg/dL — ABNORMAL LOW (ref 8.9–10.3)
Chloride: 101 mmol/L (ref 98–111)
Creatinine, Ser: 8.47 mg/dL — ABNORMAL HIGH (ref 0.61–1.24)
GFR calc Af Amer: 7 mL/min — ABNORMAL LOW (ref >60)
GFR calc non Af Amer: 6 mL/min — ABNORMAL LOW (ref >60)
Glucose, Bld: 94 mg/dL (ref 70–99)
Potassium: 3.7 mmol/L (ref 3.5–5.1)
Sodium: 138 mmol/L (ref 135–145)

## 2019-12-28 LAB — CBC
HCT: 28.6 % — ABNORMAL LOW (ref 39.0–52.0)
Hemoglobin: 9.6 g/dL — ABNORMAL LOW (ref 13.0–17.0)
MCH: 30.8 pg (ref 26.0–34.0)
MCHC: 33.6 g/dL (ref 30.0–36.0)
MCV: 91.7 fL (ref 80.0–100.0)
Platelets: 205 10*3/uL (ref 150–400)
RBC: 3.12 MIL/uL — ABNORMAL LOW (ref 4.22–5.81)
RDW: 17.6 % — ABNORMAL HIGH (ref 11.5–15.5)
WBC: 7.7 10*3/uL (ref 4.0–10.5)
nRBC: 0 % (ref 0.0–0.2)

## 2019-12-28 LAB — HEPATIC FUNCTION PANEL
ALT: 8 U/L (ref 0–44)
AST: 28 U/L (ref 15–41)
Albumin: 2.2 g/dL — ABNORMAL LOW (ref 3.5–5.0)
Alkaline Phosphatase: 87 U/L (ref 38–126)
Bilirubin, Direct: <0.1 mg/dL (ref 0.0–0.2)
Total Bilirubin: 0.3 mg/dL (ref 0.3–1.2)
Total Protein: 6 g/dL — ABNORMAL LOW (ref 6.5–8.1)

## 2019-12-28 LAB — FERRITIN: Ferritin: 138 ng/mL (ref 24–336)

## 2019-12-28 LAB — IRON AND TIBC
Iron: 31 ug/dL — ABNORMAL LOW (ref 45–182)
Saturation Ratios: 17 % — ABNORMAL LOW (ref 17.9–39.5)
TIBC: 180 ug/dL — ABNORMAL LOW (ref 250–450)
UIBC: 149 ug/dL

## 2019-12-28 MED ORDER — SODIUM CHLORIDE 0.9 % IV BOLUS
500.0000 mL | Freq: Once | INTRAVENOUS | Status: AC
  Administered 2019-12-28: 500 mL via INTRAVENOUS

## 2019-12-28 MED ORDER — CALCIUM GLUCONATE-NACL 2-0.675 GM/100ML-% IV SOLN
2.0000 g | Freq: Once | INTRAVENOUS | Status: AC
  Administered 2019-12-28: 2000 mg via INTRAVENOUS
  Filled 2019-12-28: qty 100

## 2019-12-28 MED ORDER — SODIUM CHLORIDE 0.9 % IV SOLN: INTRAVENOUS | Status: DC

## 2019-12-28 MED ORDER — CALCIUM GLUCONATE-NACL 1-0.675 GM/50ML-% IV SOLN
1.0000 g | Freq: Once | INTRAVENOUS | Status: AC
  Administered 2019-12-28: 1000 mg via INTRAVENOUS
  Filled 2019-12-28: qty 50

## 2019-12-28 NOTE — Progress Notes (Signed)
Patient ID: Chase [name], male   DOB: July 03, 1965, 54 y.o.   MRN: [id number]  3 Days Post-Op Subjective: Pt s/p bilateral nephrostomy placement yesterday afternoon.  Pt sore at PCN sites but otherwise doing well.  Complains of dietary restrictions.  Objective: Vital signs in last 24 hours: Temp:  [98.3 F (36.8 C)-98.5 F (36.9 C)] 98.3 F (36.8 C) (09/28 0525) Pulse Rate:  [54-61] 56 (09/28 0525) Resp:  [7-20] 16 (09/28 0525) BP: (100-110)/(63-71) 100/63 (09/28 0525) SpO2:  [98 %-100 %] 100 % (09/28 0525) Weight:  [56.1 kg] 56.1 kg (09/28 0500)  Intake/Output from previous day: 09/27 0701 - 09/28 0700 In: 2218 [P.O.:472; I.V.:1376; IV Piggyback:350] Out: 2560 [Urine:2560] Intake/Output this shift: No intake/output data recorded.  Physical Exam:  General: Alert and oriented Abdomen: Soft, ND, B PCNs in place and draining.  R PCN clearing and L PCN still with bloody drainage but draining ok. GU: Urethral catheter in place with blood tinged urine.   Lab Results: Recent Labs    12/26/19 0714 12/27/19 0450 12/28/19 0503  HGB 6.8* 9.7* 9.6*  HCT 19.9* 28.2* 28.6*   BMET Recent Labs    12/27/19 0450 12/28/19 0503  NA 137 138  K 3.3* 3.7  CL 101 101  CO2 19* 21*  GLUCOSE 96 94  BUN 97* 98*  CREATININE 7.98* 8.47*  CALCIUM 6.3* 6.6*     Studies/Results: IR NEPHROSTOMY PLACEMENT LEFT  Result Date: 12/27/2019 INDICATION: 54 year old male with obstructed bilateral collecting system EXAM: IR NEPHROSTOMY PLACEMENT RIGHT; IR NEPHROSTOMY PLACEMENT LEFT COMPARISON:  None. MEDICATIONS: 400 mg Cipro; The antibiotic was administered in an appropriate time frame prior to skin puncture. ANESTHESIA/SEDATION: Fentanyl 1.0 mcg IV; Versed 50 mg IV Moderate Sedation Time:  16 minutes The patient was continuously monitored during the procedure by the interventional radiology nurse under my direct supervision. CONTRAST:  34mL OMNIPAQUE IOHEXOL 300 MG/ML SOLN - administered into the  collecting system(s) FLUOROSCOPY TIME:  Fluoroscopy Time: 2 minutes 0 seconds (60 mGy). COMPLICATIONS: None PROCEDURE: Informed written consent was obtained from the patient after a thorough discussion of the procedural risks, benefits and alternatives. All questions were addressed. Maximal Sterile Barrier Technique was utilized including caps, mask, sterile gowns, sterile gloves, sterile drape, hand hygiene and skin antiseptic. A timeout was performed prior to the initiation of the procedure. Patient was placed in the prone position on the fluoroscopy table. Ultrasound survey was performed with images stored and sent to PACs. The bilateral flank region prepped with Betadine, draped in usual sterile fashion, infiltrated locally with 1% lidocaine. Under real-time ultrasound guidance, a 21-gauge trocar needle was advanced into a posterior lower pole calyx of the left collecting system. Ultrasound image documentation was saved. Urine spontaneously returned through the needle. An 018 micro wire was then guided into the collecting system. A small incision was made with 11 blade scalpel, and the needle was exchanged over a guidewire for transitional Envy system. The wire and the inner cannula were removed. Contrast injection confirmed appropriate positioning. The Accustick catheter was then exchanged over a guidewire for an 10 French pigtail drain, formed centrally within the left renal collecting system. Contrast injection confirms appropriate positioning and patency. The drain was then secured externally with 0 Prolene suture and placed to external drain bag. The right flank region was then infiltrated locally with 1% lidocaine. Under real-time ultrasound guidance, a 21-gauge trocar needle was advanced into a posterior lower pole calyx of the right collecting system. Ultrasound image documentation was  saved. Urine spontaneously returned through the needle. An 018 micro wire was then guided into the collecting system. A  small incision was made with 11 blade scalpel, and the needle was exchanged over a guidewire for transitional Envy system. The wire and the inner cannula were removed. Contrast injection confirmed appropriate positioning. The 4 French catheter was then exchanged over a guidewire for an 10 French pigtail drain, formed centrally within the right renal collecting system. Contrast injection confirms appropriate positioning and patency. The drain was then secured externally with 0 Prolene suture and placed to external drain bag. Patient tolerated the procedure well and remained hemodynamically stable throughout. No complications were encountered and no significant blood loss was encountered. EBL equals 0 IMPRESSION: Status post image guided placement of bilateral percutaneous nephrostomy. Signed, Dulcy Fanny. Dellia Nims, RPVI [facility] Radiology Electronically Signed   By: Corrie Mckusick D.O.   On: 12/27/2019 16:45   IR NEPHROSTOMY PLACEMENT RIGHT  Result Date: 12/27/2019 INDICATION: 54 year old male with obstructed bilateral collecting system EXAM: IR NEPHROSTOMY PLACEMENT RIGHT; IR NEPHROSTOMY PLACEMENT LEFT COMPARISON:  None. MEDICATIONS: 400 mg Cipro; The antibiotic was administered in an appropriate time frame prior to skin puncture. ANESTHESIA/SEDATION: Fentanyl 1.0 mcg IV; Versed 50 mg IV Moderate Sedation Time:  16 minutes The patient was continuously monitored during the procedure by the interventional radiology nurse under my direct supervision. CONTRAST:  56mL OMNIPAQUE IOHEXOL 300 MG/ML SOLN - administered into the collecting system(s) FLUOROSCOPY TIME:  Fluoroscopy Time: 2 minutes 0 seconds (60 mGy). COMPLICATIONS: None PROCEDURE: Informed written consent was obtained from the patient after a thorough discussion of the procedural risks, benefits and alternatives. All questions were addressed. Maximal Sterile Barrier Technique was utilized including caps,  mask, sterile gowns, sterile gloves, sterile drape, hand hygiene and skin antiseptic. A timeout was performed prior to the initiation of the procedure. Patient was placed in the prone position on the fluoroscopy table. Ultrasound survey was performed with images stored and sent to PACs. The bilateral flank region prepped with Betadine, draped in usual sterile fashion, infiltrated locally with 1% lidocaine. Under real-time ultrasound guidance, a 21-gauge trocar needle was advanced into a posterior lower pole calyx of the left collecting system. Ultrasound image documentation was saved. Urine spontaneously returned through the needle. An 018 micro wire was then guided into the collecting system. A small incision was made with 11 blade scalpel, and the needle was exchanged over a guidewire for transitional Envy system. The wire and the inner cannula were removed. Contrast injection confirmed appropriate positioning. The Accustick catheter was then exchanged over a guidewire for an 10 French pigtail drain, formed centrally within the left renal collecting system. Contrast injection confirms appropriate positioning and patency. The drain was then secured externally with 0 Prolene suture and placed to external drain bag. The right flank region was then infiltrated locally with 1% lidocaine. Under real-time ultrasound guidance, a 21-gauge trocar needle was advanced into a posterior lower pole calyx of the right collecting system. Ultrasound image documentation was saved. Urine spontaneously returned through the needle. An 018 micro wire was then guided into the collecting system. A small incision was made with 11 blade scalpel, and the needle was exchanged over a guidewire for transitional Envy system. The wire and the inner cannula were removed. Contrast injection confirmed appropriate positioning. The 4 French catheter was then exchanged over a guidewire for an 10 French pigtail drain, formed centrally within the right  renal collecting system. Contrast injection  confirms appropriate positioning and patency. The drain was then secured externally with 0 Prolene suture and placed to external drain bag. Patient tolerated the procedure well and remained hemodynamically stable throughout. No complications were encountered and no significant blood loss was encountered. EBL equals 0 IMPRESSION: Status post image guided placement of bilateral percutaneous nephrostomy. Signed, Dulcy Fanny. Dellia Nims, RPVI [facility] Rad[facility] Electronically Signed   By: Corrie Mckusick D.O.   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  Definitive surgery will be scheduled in future per [name] once patient is medically optimized and nutritional status is improved.  Will need bilateral ureteral stents in proper position to aid with identification during repair.  Once current acute medical issues are resolved/stabilized, will consider further imaging to assess location of left ureteral stent (Dr. Gloriann Loan not confident that it is in good position) and then make a decision as to how best to get indwelling stents in proper position for eventual surgical repair of colovesical fistula.  Pt will need to still make considerable improvements in medical status prior to this surgery.  [name] aware of patient's hospitalization.  2) AKI/CKD: Renal function not improved this morning but only 12 hours since nephrostomy placement.  I still do not suspect obstructive etiology as primary cause of renal failure but will have a more definitive idea over the next 24 hours. Nephrology following.  I explained the importance of continuing a renal diet to the patient this morning.   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  4) Funguria: Clinically asymptomatic.  Currently on fluconazole per ID recommendations considering recent invasive  procedures.    LOS: 4 days   [name] 12/28/2019, 7:29 AM

## 2019-12-28 NOTE — Evaluation (Signed)
Physical Therapy Evaluation Patient Details Name: Chase [name] MRN: [id number] DOB: Apr 20, 1965 Today's Date: 12/28/2019   History of Present Illness  54 yo male admitted from SNF ([facility]) with AKI s/p bil PCN 9/27, colovesical fistula. 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 He walked ~15 feet with a RW. Distance was limited by fatigue and back pain. Pt reports residual L UE and L LE weakness since CVA. Discussed d/c plan-he would like to return to SNF to complete his rehab. Will plan to follow and progress activity as tolerated.     Follow Up Recommendations SNF (pt would like to resume [facility])    Equipment Recommendations  None recommended by PT    Recommendations for Other Services       Precautions / Restrictions Precautions Precautions: Fall Precaution Comments: bil PCN tubes Restrictions Weight Bearing Restrictions: No      Mobility  Bed Mobility Overal bed mobility: Needs Assistance Bed Mobility: Sit to Supine       Sit to supine: Min assist   General bed mobility comments: Assist for L LE  Transfers Overall transfer level: Needs assistance Equipment used: Rolling walker (2 wheeled) Transfers: Sit to/from Stand Sit to Stand: Min guard         General transfer comment: Min guard for safety. VCs safety, hand placement.  Ambulation/Gait Ambulation/Gait assistance: Min guard Gait Distance (Feet): 15 Feet Assistive device: Rolling walker (2 wheeled) Gait Pattern/deviations: Step-through pattern;Decreased stride length     General Gait Details: Min guard for safety. Cues for RW proximity. Pt declined hallway ambulation 2* fatigue.  Stairs            Wheelchair Mobility    Modified Rankin (Stroke Patients Only)       Balance Overall balance assessment: Needs assistance         Standing balance support: Bilateral upper extremity  supported Standing balance-Leahy Scale: Poor                               Pertinent Vitals/Pain Pain Assessment: Faces Faces Pain Scale: Hurts even more Pain Location: back Pain Descriptors / Indicators: Aching;Discomfort;Sore Pain Intervention(s): Monitored during session;Limited activity within patient's tolerance;Repositioned    Home Living Family/patient expects to be discharged to:: Skilled nursing facility                 Additional Comments: plans to return to SNF for rehab    Prior Function Level of Independence: Needs assistance   Gait / Transfers Assistance Needed: RW for ambulation           Hand Dominance        Extremity/Trunk Assessment   Upper Extremity Assessment Upper Extremity Assessment: LUE deficits/detail LUE Deficits / Details: shoulder flex ~90 degrees AROM. Strength 3/5    Lower Extremity Assessment Lower Extremity Assessment: Generalized weakness    Cervical / Trunk Assessment Cervical / Trunk Assessment: Normal  Communication   Communication: No difficulties  Cognition Arousal/Alertness: Awake/alert Behavior During Therapy: WFL for tasks assessed/performed Overall Cognitive Status: Within Functional Limits for tasks assessed                                        General Comments      Exercises  Assessment/Plan    PT Assessment Patient needs continued PT services  PT Problem List Decreased strength;Decreased mobility;Decreased activity tolerance;Decreased balance;Decreased coordination       PT Treatment Interventions DME instruction;Gait training;Therapeutic activities;Therapeutic exercise;Patient/family education;Balance training;Functional mobility training    PT Goals (Current goals can be found in the Care Plan section)  Acute Rehab PT Goals Patient Stated Goal: get back to SNF and finish my rehab PT Goal Formulation: With patient Time For Goal Achievement: 01/11/20 Potential  to Achieve Goals: Good    Frequency Min 2X/week   Barriers to discharge        Co-evaluation               AM-PAC PT "6 Clicks" Mobility  Outcome Measure Help needed turning from your back to your side while in a flat bed without using bedrails?: None Help needed moving from lying on your back to sitting on the side of a flat bed without using bedrails?: A Little Help needed moving to and from a bed to a chair (including a wheelchair)?: A Little Help needed standing up from a chair using your arms (e.g., wheelchair or bedside chair)?: A Little Help needed to walk in hospital room?: A Little Help needed climbing 3-5 steps with a railing? : A Little 6 Click Score: 19    End of Session   Activity Tolerance: Patient limited by fatigue Patient left: in bed;with call bell/phone within reach;with bed alarm set   PT Visit Diagnosis: Muscle weakness (generalized) (M62.81);Unsteadiness on feet (R26.81);Hemiplegia and hemiparesis Hemiplegia - Right/Left: Left Hemiplegia - dominant/non-dominant: Non-dominant Hemiplegia - caused by: Cerebral infarction    Time: 5701-7793 PT Time Calculation (min) (ACUTE ONLY): 35 min   Charges:   PT Evaluation $PT Eval Low Complexity: 1 Low PT Treatments $Gait Training: 8-22 mins          Doreatha Massed, PT Acute Rehabilitation  Office: [phone] Pager: [phone]

## 2019-12-28 NOTE — Progress Notes (Signed)
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    CT abdomen showed severe bilateral hydronephrosis and possible colovesical fistula, creatinine 7.1 (baseline 2.4-2.8).  Admitted for acute on chronic kidney disease, severe anion gap metabolic acidosis, hyperkalemia, bilateral hydronephrosis.  Nephrology and urology consulting.  9/25, s/p left ureteral stent replaced but could not get right ureteral stent.  Bilateral percutaneous nephrostomies placed by IR 12/27/19. Renal function has continued to worsen.    Interval History:  No events overnight.  States that his urostomy bag on the right side is starting to clear but the left side remains bloody.  States he has a little bit of pain at the tube insertion sites but otherwise feeling okay.  Nervous about needing dialysis but understands the need if recommended.  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  Baseline creatinine 2.4-2.8.  Presented with creatinine of 7.1, potassium 5.5 and bicarbonate 7.  CT abdomen shows bilateral severe hydronephrosis.  Foley catheter in place.  Suspecting obstruction  as the primary issue which may have led to ATN.  On 9/25 per Urology s/p left ureteral stent replaced but could not get right ureteral stent.   - now s/p B/L PCN by IR on 9/27.  - creatinine continues to rise; BUN uptrending; no volume overload signs; remains on oral bicarb with improvement in acidosis; subtle asterixis - per nephrology if no improvement in renal function on 9/29, will need HD access placed and transfer to [facility] to initiate HD  Hyperkalemia/hypokalemia: Hyperkalemia resolved.  Treating as indicated   Anion gap metabolic acidosis: Bicarbonate drip discontinued and started oral bicarbonate 9/27.  Hypocalcemia: Replete and recheck as needed  Bilateral hydronephrosis/ureteral stent/hematuria: Urology input appreciated. On 9/25 s/p left ureteral stent replaced but could not get right ureteral stent.  Now bilateral nephrostomy tube placement by IR 9/27.    Possible complicated UTI: In a patient with recent urological interventions, dysuria although he is afebrile and without leukocytosis.  Previous urine cultures had shown E. coli and Pseudomonas.  Patient was started on IV Zosyn.  However urine culture now shows 70 K colonies of yeast.  Discussed with infectious disease MD on call who recommended starting fluconazole and IV Zosyn discontinued.  Acute blood loss anemia complicating chronic microcytic anemia Hemoglobin has dropped from 8 g range to 6.7-6.8, confirmed on redraw.  S/p 2 units PRBC and hemoglobin up to 9.7.  Follow CBC in a.m.  Anxiety and depression: Cymbalta on hold due to renal insufficiency  History of CVA with residual left hemiparesis: SNF resident.  Therapies evaluation when able.  Body mass index is 21.49 kg/m.  Nutritional Status Nutrition Problem: Inadequate oral intake Etiology: acute illness (bilateral hydronephrosis) Signs/Symptoms: meal completion < 25% Interventions: Ensure Enlive (  each supplement provides 350kcal and 20 grams of  protein), MVI, Juven  Antimicrobials: Zosyn 12/25/2019>>9/27 Fluconazole 9/27>> present   DVT prophylaxis: SCD Code Status: Full Family Communication: None present Disposition Plan: Status is: Inpatient  Remains inpatient appropriate because:Ongoing diagnostic testing needed not appropriate for outpatient work up, IV treatments appropriate due to intensity of illness or inability to take PO and Inpatient level of care appropriate due to severity of illness   Dispo: The patient is from: Home              Anticipated d/c is to: SNF              Anticipated d/c date is: > 3 days              Patient currently is not medically stable to d/c.  Objective: Blood pressure 123/66, pulse (!) 57, temperature 98.4 F (36.9 C), temperature source Oral, resp. rate 15, height 5\' 3"  (1.6 m), weight 56.1 kg, SpO2 100 %.  Examination: General appearance: alert, cooperative and no distress Head: Normocephalic, without obvious abnormality, atraumatic Eyes: EOMI Lungs: clear to auscultation bilaterally Heart: regular rate and rhythm and S1, S2 normal Abdomen: normal findings: bowel sounds normal and soft, non-tender Extremities: no edema Skin: mobility and turgor normal Neurologic: no focal deficits; subtle asterixis   Consultants:   Nephrology  Procedures:   none  Data Reviewed: I have personally reviewed following labs and imaging studies Results for orders placed or performed during the hospital encounter of 12/24/19 (from the past 24 hour(s))  Aerobic/Anaerobic Culture (surgical/deep wound)     Status: None (Preliminary result)   Collection Time: 12/27/19  4:26 PM   Specimen: Abscess  Result Value Ref Range   Specimen Description      ABSCESS LEFT KIDNEY Performed at [facility], [place] [address] [place], [place] [address]    Special Requests      NONE Performed at [facility], [place] [address], [place], [place] [address]    Gram Stain       MODERATE WBC PRESENT, PREDOMINANTLY MONONUCLEAR FEW YEAST    Culture      TOO YOUNG TO READ Performed at [facility], [place] [address] [place], [place] [address]    Report Status PENDING   Aerobic/Anaerobic Culture (surgical/deep wound)     Status: None (Preliminary result)   Collection Time: 12/27/19  4:36 PM   Specimen: Abscess  Result Value Ref Range   Specimen Description      ABSCESS RIGHT KIDNEY Performed at [place] [address], [place], [place] [address]    Special Requests      NONE Performed at [facility], [place] [address], [place], [place] [address]    Gram Stain      RARE WBC PRESENT,BOTH PMN AND MONONUCLEAR NO ORGANISMS SEEN    Culture      NO GROWTH < 12 HOURS Performed at [facility], [place] [address], [place], [place] [address]    Report Status PENDING   CBC     Status: Abnormal   Collection Time: 12/28/19  5:03 AM  Result Value Ref Range   WBC 7.7 4.0 - 10.5 K/uL   RBC 3.12 (L) 4.22 - 5.81 MIL/uL   Hemoglobin 9.6 (L) 13.0 - 17.0 g/dL   HCT 28.6 (L) 39 - 52 %   MCV 91.7 80.0 - 100.0 fL   MCH 30.8 26.0 - 34.0 pg  MCHC 33.6 30.0 - 36.0 g/dL   RDW 17.6 (H) 11.5 - 15.5 %   Platelets 205 150 - 400 K/uL   nRBC 0.0 0.0 - 0.2 %  Basic metabolic panel     Status: Abnormal   Collection Time: 12/28/19  5:03 AM  Result Value Ref Range   Sodium 138 135 - 145 mmol/L   Potassium 3.7 3.5 - 5.1 mmol/L   Chloride 101 98 - 111 mmol/L   CO2 21 (L) 22 - 32 mmol/L   Glucose, Bld 94 70 - 99 mg/dL   BUN 98 (H) 6 - 20 mg/dL   Creatinine, Ser 8.47 (H) 0.61 - 1.24 mg/dL   Calcium 6.6 (L) 8.9 - 10.3 mg/dL   GFR calc non Af Amer 6 (L) >60 mL/min   GFR calc Af Amer 7 (L) >60 mL/min   Anion gap 16 (H) 5 - 15  Hepatic function panel     Status: Abnormal   Collection Time: 12/28/19  5:03 AM  Result Value Ref Range   Total Protein 6.0 (L) 6.5 - 8.1 g/dL   Albumin 2.2 (L) 3.5 - 5.0 g/dL   AST 28 15 - 41 U/L   ALT 8 0  - 44 U/L   Alkaline Phosphatase 87 38 - 126 U/L   Total Bilirubin 0.3 0.3 - 1.2 mg/dL   Bilirubin, Direct <0.1 0.0 - 0.2 mg/dL   Indirect Bilirubin NOT CALCULATED 0.3 - 0.9 mg/dL  Iron and TIBC     Status: Abnormal   Collection Time: 12/28/19  5:03 AM  Result Value Ref Range   Iron 31 (L) 45 - 182 ug/dL   TIBC 180 (L) 250 - 450 ug/dL   Saturation Ratios 17 (L) 17.9 - 39.5 %   UIBC 149 ug/dL  Ferritin     Status: None   Collection Time: 12/28/19  5:03 AM  Result Value Ref Range   Ferritin 138 24 - 336 ng/mL    Recent Results (from the past 240 hour(s))  Urine culture     Status: Abnormal   Collection Time: 12/24/19 10:33 AM   Specimen: Urine, Clean Catch  Result Value Ref Range Status   Specimen Description   Final    URINE, CLEAN CATCH Performed at [facility], [place] [address] [place], [place] [address]    Special Requests   Final    NONE Performed at [facility], [place] [address] [place], [place] [address]    Culture 70,000 COLONIES/mL YEAST (A)  Final   Report Status 12/26/2019 FINAL  Final  Respiratory Panel by RT PCR (Flu A&B, Covid) - Nasopharyngeal Swab     Status: None   Collection Time: 12/24/19  2:22 PM   Specimen: Nasopharyngeal Swab  Result Value Ref Range Status   SARS Coronavirus 2 by RT PCR NEGATIVE NEGATIVE Final    Comment: (NOTE) SARS-CoV-2 target nucleic acids are NOT DETECTED.  The SARS-CoV-2 RNA is generally detectable in upper respiratoy specimens during the acute phase of infection. The lowest concentration of SARS-CoV-2 viral copies this assay can detect is 131 copies/mL. A negative result does not preclude SARS-Cov-2 infection and should not be used as the sole basis for treatment or other patient management decisions. A negative result may occur with  improper specimen collection/handling, submission of specimen other than nasopharyngeal swab, presence of viral mutation(s) within the areas targeted by this  assay, and inadequate number of viral copies (<131 copies/mL). A negative result must be combined  with clinical observations, patient history, and epidemiological information. The expected result is Negative.  Fact Sheet for Patients:  PinkCheek.be  Fact Sheet for Healthcare Providers:  GravelBags.it  This test is no t yet approved or cleared by the [place] FDA and  has been authorized for detection and/or diagnosis of SARS-CoV-2 by FDA under an Emergency Use Authorization (EUA). This EUA will remain  in effect (meaning this test can be used) for the duration of the COVID-19 declaration under Section 564(b)(1) of the Act, 21 U.S.C. section 360bbb-3(b)(1), unless the authorization is terminated or revoked sooner.     Influenza A by PCR NEGATIVE NEGATIVE Final   Influenza B by PCR NEGATIVE NEGATIVE Final    Comment: (NOTE) The Xpert Xpress SARS-CoV-2/FLU/RSV assay is intended as an aid in  the diagnosis of influenza from Nasopharyngeal swab specimens and  should not be used as a sole basis for treatment. Nasal washings and  aspirates are unacceptable for Xpert Xpress SARS-CoV-2/FLU/RSV  testing.  Fact Sheet for Patients: PinkCheek.be  Fact Sheet for Healthcare Providers: GravelBags.it  This test is not yet approved or cleared by the [place] FDA and  has been authorized for detection and/or diagnosis of SARS-CoV-2 by  FDA under an Emergency Use Authorization (EUA). This EUA will remain  in effect (meaning this test can be used) for the duration of the  Covid-19 declaration under Section 564(b)(1) of the Act, 21  U.S.C. section 360bbb-3(b)(1), unless the authorization is  terminated or revoked. Performed at [facility], [place] [address] [place], [place] [address]   Aerobic/Anaerobic Culture (surgical/deep wound)     Status: None  (Preliminary result)   Collection Time: 12/27/19  4:26 PM   Specimen: Abscess  Result Value Ref Range Status   Specimen Description   Final    ABSCESS LEFT KIDNEY Performed at [place] [address] [place], [place] [address]    Special Requests   Final    NONE Performed at [facility], [place] [address] [place], [place] [address]    Gram Stain   Final    MODERATE WBC PRESENT, PREDOMINANTLY MONONUCLEAR FEW YEAST    Culture   Final    TOO YOUNG TO READ Performed at [facility], [place] [address], [place], [place] [address]    Report Status PENDING  Incomplete  Aerobic/Anaerobic Culture (surgical/deep wound)     Status: None (Preliminary result)   Collection Time: 12/27/19  4:36 PM   Specimen: Abscess  Result Value Ref Range Status   Specimen Description   Final    ABSCESS RIGHT KIDNEY Performed at [place] [address] [place], [place] [address]    Special Requests   Final    NONE Performed at [facility], [place] [address], [place], [place] [address]    Gram Stain   Final    RARE WBC PRESENT,BOTH PMN AND MONONUCLEAR NO ORGANISMS SEEN    Culture   Final    NO GROWTH < 12 HOURS Performed at [facility], [place] [address] [place], [place] [address]    Report Status PENDING  Incomplete     Radiology Studies: IR NEPHROSTOMY PLACEMENT LEFT  Result Date: 12/27/2019 INDICATION: 54 year old male with obstructed bilateral collecting system EXAM: IR NEPHROSTOMY PLACEMENT RIGHT; IR NEPHROSTOMY PLACEMENT LEFT COMPARISON:  None. MEDICATIONS: 400 mg Cipro; The antibiotic was administered in an appropriate time frame prior to skin puncture. ANESTHESIA/SEDATION: Fentanyl 1.0 mcg  IV; Versed 50 mg IV Moderate Sedation Time:  16 minutes The patient was continuously monitored during the procedure by the interventional radiology nurse under my direct supervision. CONTRAST:  66mL OMNIPAQUE IOHEXOL  300 MG/ML SOLN - administered into the collecting system(s) FLUOROSCOPY TIME:  Fluoroscopy Time: 2 minutes 0 seconds (60 mGy). COMPLICATIONS: None PROCEDURE: Informed written consent was obtained from the patient after a thorough discussion of the procedural risks, benefits and alternatives. All questions were addressed. Maximal Sterile Barrier Technique was utilized including caps, mask, sterile gowns, sterile gloves, sterile drape, hand hygiene and skin antiseptic. A timeout was performed prior to the initiation of the procedure. Patient was placed in the prone position on the fluoroscopy table. Ultrasound survey was performed with images stored and sent to PACs. The bilateral flank region prepped with Betadine, draped in usual sterile fashion, infiltrated locally with 1% lidocaine. Under real-time ultrasound guidance, a 21-gauge trocar needle was advanced into a posterior lower pole calyx of the left collecting system. Ultrasound image documentation was saved. Urine spontaneously returned through the needle. An 018 micro wire was then guided into the collecting system. A small incision was made with 11 blade scalpel, and the needle was exchanged over a guidewire for transitional Envy system. The wire and the inner cannula were removed. Contrast injection confirmed appropriate positioning. The Accustick catheter was then exchanged over a guidewire for an 10 French pigtail drain, formed centrally within the left renal collecting system. Contrast injection confirms appropriate positioning and patency. The drain was then secured externally with 0 Prolene suture and placed to external drain bag. The right flank region was then infiltrated locally with 1% lidocaine. Under real-time ultrasound guidance, a 21-gauge trocar needle was advanced into a posterior lower pole calyx of the right collecting system. Ultrasound image documentation was saved. Urine spontaneously returned through the needle. An 018 micro wire was  then guided into the collecting system. A small incision was made with 11 blade scalpel, and the needle was exchanged over a guidewire for transitional Envy system. The wire and the inner cannula were removed. Contrast injection confirmed appropriate positioning. The 4 French catheter was then exchanged over a guidewire for an 10 French pigtail drain, formed centrally within the right renal collecting system. Contrast injection confirms appropriate positioning and patency. The drain was then secured externally with 0 Prolene suture and placed to external drain bag. Patient tolerated the procedure well and remained hemodynamically stable throughout. No complications were encountered and no significant blood loss was encountered. EBL equals 0 IMPRESSION: Status post image guided placement of bilateral percutaneous nephrostomy. Signed, Dulcy Fanny. Dellia Nims, RPVI [facility] Radiology Electronically Signed   By: Corrie Mckusick D.O.   On: 12/27/2019 16:45   IR NEPHROSTOMY PLACEMENT RIGHT  Result Date: 12/27/2019 INDICATION: 54 year old male with obstructed bilateral collecting system EXAM: IR NEPHROSTOMY PLACEMENT RIGHT; IR NEPHROSTOMY PLACEMENT LEFT COMPARISON:  None. MEDICATIONS: 400 mg Cipro; The antibiotic was administered in an appropriate time frame prior to skin puncture. ANESTHESIA/SEDATION: Fentanyl 1.0 mcg IV; Versed 50 mg IV Moderate Sedation Time:  16 minutes The patient was continuously monitored during the procedure by the interventional radiology nurse under my direct supervision. CONTRAST:  68mL OMNIPAQUE IOHEXOL 300 MG/ML SOLN - administered into the collecting system(s) FLUOROSCOPY TIME:  Fluoroscopy Time: 2 minutes 0 seconds (60 mGy). COMPLICATIONS: None PROCEDURE: Informed written consent was obtained from the patient after a thorough discussion of the procedural risks, benefits and alternatives. All questions were addressed.  Maximal Sterile Barrier  Technique was utilized including caps, mask, sterile gowns, sterile gloves, sterile drape, hand hygiene and skin antiseptic. A timeout was performed prior to the initiation of the procedure. Patient was placed in the prone position on the fluoroscopy table. Ultrasound survey was performed with images stored and sent to PACs. The bilateral flank region prepped with Betadine, draped in usual sterile fashion, infiltrated locally with 1% lidocaine. Under real-time ultrasound guidance, a 21-gauge trocar needle was advanced into a posterior lower pole calyx of the left collecting system. Ultrasound image documentation was saved. Urine spontaneously returned through the needle. An 018 micro wire was then guided into the collecting system. A small incision was made with 11 blade scalpel, and the needle was exchanged over a guidewire for transitional Envy system. The wire and the inner cannula were removed. Contrast injection confirmed appropriate positioning. The Accustick catheter was then exchanged over a guidewire for an 10 French pigtail drain, formed centrally within the left renal collecting system. Contrast injection confirms appropriate positioning and patency. The drain was then secured externally with 0 Prolene suture and placed to external drain bag. The right flank region was then infiltrated locally with 1% lidocaine. Under real-time ultrasound guidance, a 21-gauge trocar needle was advanced into a posterior lower pole calyx of the right collecting system. Ultrasound image documentation was saved. Urine spontaneously returned through the needle. An 018 micro wire was then guided into the collecting system. A small incision was made with 11 blade scalpel, and the needle was exchanged over a guidewire for transitional Envy system. The wire and the inner cannula were removed. Contrast injection confirmed appropriate positioning. The 4 French catheter was then exchanged over a guidewire for an 10 French pigtail  drain, formed centrally within the right renal collecting system. Contrast injection confirms appropriate positioning and patency. The drain was then secured externally with 0 Prolene suture and placed to external drain bag. Patient tolerated the procedure well and remained hemodynamically stable throughout. No complications were encountered and no significant blood loss was encountered. EBL equals 0 IMPRESSION: Status post image guided placement of bilateral percutaneous nephrostomy. Signed, Dulcy Fanny. Dellia Nims, RPVI [facility] Radiology Electronically Signed   By: Corrie Mckusick D.O.   On: 12/27/2019 16:45   IR NEPHROSTOMY PLACEMENT RIGHT  Final Result    IR NEPHROSTOMY PLACEMENT LEFT  Final Result    US RENAL  Final Result    DG C-Arm 1-60 Min-No Report  Final Result    CT Renal Stone Study  Final Result  Addendum 1 of 1  ADDENDUM REPORT: 12/24/2019 12:35    ADDENDUM:  The possibility of a leak in the posterior urinary bladder in this  patient thought to have a fistula was again discussed with the  provider. Additionally, the presence of marked bilateral  hydronephrosis, worse on the RIGHT also was discussed. Perhaps a  cystogram prior to stented exchange if will not significantly delay  care could be helpful for further management as clinically  warranted. This would avoid the confounding effect of contrast which  could be introduced during stent exchange. Urologic consultation was  suggested.    These results were called by telephone at the time of interpretation  on 12/24/2019 at 12:34 pm to provider [facility] , who verbally  acknowledged these results.      Electronically Signed    By: Zetta Bills M.D.    On: 12/24/2019 12:35      Final  Scheduled Meds: . Chlorhexidine Gluconate Cloth  6 each Topical Daily  . feeding supplement (ENSURE ENLIVE)  237 mL Oral TID BM  . fluconazole  200 mg Oral Daily  .  multivitamin with minerals  1 tablet Oral Daily  . nicotine  14 mg Transdermal Daily  . nutrition supplement (JUVEN)  1 packet Oral BID BM  . sodium bicarbonate  1,300 mg Oral BID  . sodium chloride flush  5 mL Intracatheter Q8H  . tamsulosin  0.4 mg Oral Daily   PRN Meds: acetaminophen **OR** acetaminophen, HYDROcodone-acetaminophen, hydrOXYzine Continuous Infusions: . sodium chloride Stopped (12/28/19 1505)  . calcium gluconate 1,000 mg (12/28/19 1511)      LOS: 4 days  Time spent: Greater than 50% of the 35 minute visit was spent in counseling/coordination of care for the patient as laid out in the A&P.   Dwyane Dee, MD [facility] 12/28/2019, 3:57 PM  Contact via secure chat.  To contact the attending provider between 7A-7P or the covering provider during after hours 7P-7A, please log into the web site [email or web address] and access using universal [place] password for that web site. If you do not have the password, please call the hospital operator.

## 2019-12-28 NOTE — Progress Notes (Signed)
Referring Physician(s): Dr. Gloriann Loan   Supervising Physician: Jacqulynn Cadet  Patient Status:  [facility] - In-pt  Chief Complaint: Bilateral hydronephrosis, s/p bilateral percutaneous nephrostomy tubes 12/27/19 with Dr. Earleen Newport.   Subjective: Patient in bed, no apparent discomfort/distress observed. He is complaining of an upset stomach/diarrhea. He states he is a little upset that his kidney function is not better and he's worried about needing dialysis.   Allergies: Nsaids  Medications: Prior to Admission medications   Medication Sig Start Date End Date Taking? Authorizing Provider  cyanocobalamin (,VITAMIN B-12,) 1000 MCG/ML injection Inject 1,000 mcg into the muscle every 30 (thirty) days.  12/28/13  Yes [provider]  docusate sodium (COLACE) 100 MG capsule Take 1 capsule (100 mg total) by mouth 2 (two) times daily as needed for mild constipation. 11/19/19  Yes Antonieta Pert, MD  DULoxetine (CYMBALTA) 30 MG capsule Take 30-60 mg by mouth See admin instructions. Take 2 capsules in the morning and 1 capsule in the evening 08/12/17  Yes [provider]  gabapentin (NEURONTIN) 400 MG capsule Take 800 mg by mouth at bedtime.   Yes [provider]  HYDROcodone-acetaminophen (NORCO/VICODIN) 5-325 MG tablet Take 1-2 tablets by mouth every 4 (four) hours as needed for up to 10 doses for moderate pain or severe pain (1 moderate, 2 severe). 11/19/19  Yes Antonieta Pert, MD  hydrOXYzine (ATARAX/VISTARIL) 10 MG tablet Take 1 tablet (10 mg total) by mouth 3 (three) times daily as needed for itching or anxiety. 10/26/19  Yes Mikhail, Maryann, DO  midodrine (PROAMATINE) 2.5 MG tablet Take 2 tablets (5 mg total) by mouth 3 (three) times daily with meals. Patient taking differently: Take 2.5-5 mg by mouth 3 (three) times daily as needed (hypotension).  11/19/19  Yes Antonieta Pert, MD  Multiple Vitamin (MULTIVITAMIN WITH MINERALS) TABS tablet Take 1 tablet by mouth daily. 10/27/19  Yes Mikhail,  Velta Addison, DO  tamsulosin (FLOMAX) 0.4 MG CAPS capsule Take 0.4 mg by mouth daily.    Yes [provider]  feeding supplement, ENSURE ENLIVE, (ENSURE ENLIVE) LIQD Take 237 mLs by mouth 3 (three) times daily between meals. Patient not taking: Reported on 12/24/2019 10/26/19   Cristal Ford, DO     Vital Signs: BP 123/66 (BP Location: Right Wrist)   Pulse (!) 57   Temp 98.4 F (36.9 C) (Oral)   Resp 15   Ht 5\' 3"  (1.6 m)   Wt 123 lb 11.2 oz (56.1 kg)   SpO2 100%   BMI 21.91 kg/m   Physical Exam Constitutional:      General: He is not in acute distress.    Appearance: He is ill-appearing.  Pulmonary:     Effort: Pulmonary effort is normal.  Abdominal:     Comments: Left PCN with clear red fluid in gravity bag. Right PCN with clear urine. Mild tenderness around the sites.   Skin:    General: Skin is warm and dry.  Neurological:     Mental Status: He is alert and oriented to person, place, and time.     Imaging: US RENAL  Result Date: 12/25/2019 CLINICAL DATA:  Hydronephrosis. EXAM: RENAL / URINARY TRACT ULTRASOUND COMPLETE COMPARISON:  November 12, 2019 FINDINGS: Right Kidney: Renal measurements: 11.3 x 6.4 x 7.9 cm = volume: 300 mL. Marked hydronephrosis. Increased echogenicity of the renal cortex. Left Kidney: Renal measurements: 10.8 x 9.7 x 10.2 cm = volume: 559 mL. Marked hydronephrosis. Increased echogenicity of the renal cortex. Echogenic material within the lower  pole calices may represent residual contrast or inspissated debris/blood products. Stent artifact is seen in the left renal pelvis. Bladder: Mildly distended around urinary Foley. Again seen is isoechoic avascular material within the dependent portion of the urinary bladder. Other: None. IMPRESSION: 1. Marked bilateral hydronephrosis, worse on the left. 2. Increased cortical echogenicity of the kidneys, consistent with medical renal disease. 3. Left ureteral stent is seen. The right ureteral stent is not seen  sonographically. 4. Mildly distended urinary bladder around urinary Foley with isoechoic avascular material within the dependent portion of the urinary bladder, possibly representing debris or organizing blood products. 5. Increased echogenicity within the left lower pole renal calices may represent blood products or inspissated debris. Electronically Signed   By: Fidela Salisbury M.D.   On: 12/25/2019 16:58   DG C-Arm 1-60 Min-No Report  Result Date: 12/25/2019 Fluoroscopy was utilized by the requesting physician.  No radiographic interpretation.   IR NEPHROSTOMY PLACEMENT LEFT  Result Date: 12/27/2019 INDICATION: 54 year old male with obstructed bilateral collecting system EXAM: IR NEPHROSTOMY PLACEMENT RIGHT; IR NEPHROSTOMY PLACEMENT LEFT COMPARISON:  None. MEDICATIONS: 400 mg Cipro; The antibiotic was administered in an appropriate time frame prior to skin puncture. ANESTHESIA/SEDATION: Fentanyl 1.0 mcg IV; Versed 50 mg IV Moderate Sedation Time:  16 minutes The patient was continuously monitored during the procedure by the interventional radiology nurse under my direct supervision. CONTRAST:  60mL OMNIPAQUE IOHEXOL 300 MG/ML SOLN - administered into the collecting system(s) FLUOROSCOPY TIME:  Fluoroscopy Time: 2 minutes 0 seconds (60 mGy). COMPLICATIONS: None PROCEDURE: Informed written consent was obtained from the patient after a thorough discussion of the procedural risks, benefits and alternatives. All questions were addressed. Maximal Sterile Barrier Technique was utilized including caps, mask, sterile gowns, sterile gloves, sterile drape, hand hygiene and skin antiseptic. A timeout was performed prior to the initiation of the procedure. Patient was placed in the prone position on the fluoroscopy table. Ultrasound survey was performed with images stored and sent to PACs. The bilateral flank region prepped with Betadine, draped in usual sterile fashion, infiltrated locally with 1% lidocaine.  Under real-time ultrasound guidance, a 21-gauge trocar needle was advanced into a posterior lower pole calyx of the left collecting system. Ultrasound image documentation was saved. Urine spontaneously returned through the needle. An 018 micro wire was then guided into the collecting system. A small incision was made with 11 blade scalpel, and the needle was exchanged over a guidewire for transitional Envy system. The wire and the inner cannula were removed. Contrast injection confirmed appropriate positioning. The Accustick catheter was then exchanged over a guidewire for an 10 French pigtail drain, formed centrally within the left renal collecting system. Contrast injection confirms appropriate positioning and patency. The drain was then secured externally with 0 Prolene suture and placed to external drain bag. The right flank region was then infiltrated locally with 1% lidocaine. Under real-time ultrasound guidance, a 21-gauge trocar needle was advanced into a posterior lower pole calyx of the right collecting system. Ultrasound image documentation was saved. Urine spontaneously returned through the needle. An 018 micro wire was then guided into the collecting system. A small incision was made with 11 blade scalpel, and the needle was exchanged over a guidewire for transitional Envy system. The wire and the inner cannula were removed. Contrast injection confirmed appropriate positioning. The 4 French catheter was then exchanged over a guidewire for an 10 French pigtail drain, formed centrally within the right renal collecting system. Contrast injection confirms appropriate positioning and patency.  The drain was then secured externally with 0 Prolene suture and placed to external drain bag. Patient tolerated the procedure well and remained hemodynamically stable throughout. No complications were encountered and no significant blood loss was encountered. EBL equals 0 IMPRESSION: Status post image guided placement  of bilateral percutaneous nephrostomy. Signed, Dulcy Fanny. Dellia Nims, RPVI Vascular and Interventional Radiology Specialists [facility] Radiology Electronically Signed   By: Corrie Mckusick D.O.   On: 12/27/2019 16:45   IR NEPHROSTOMY PLACEMENT RIGHT  Result Date: 12/27/2019 INDICATION: 54 year old male with obstructed bilateral collecting system EXAM: IR NEPHROSTOMY PLACEMENT RIGHT; IR NEPHROSTOMY PLACEMENT LEFT COMPARISON:  None. MEDICATIONS: 400 mg Cipro; The antibiotic was administered in an appropriate time frame prior to skin puncture. ANESTHESIA/SEDATION: Fentanyl 1.0 mcg IV; Versed 50 mg IV Moderate Sedation Time:  16 minutes The patient was continuously monitored during the procedure by the interventional radiology nurse under my direct supervision. CONTRAST:  44mL OMNIPAQUE IOHEXOL 300 MG/ML SOLN - administered into the collecting system(s) FLUOROSCOPY TIME:  Fluoroscopy Time: 2 minutes 0 seconds (60 mGy). COMPLICATIONS: None PROCEDURE: Informed written consent was obtained from the patient after a thorough discussion of the procedural risks, benefits and alternatives. All questions were addressed. Maximal Sterile Barrier Technique was utilized including caps, mask, sterile gowns, sterile gloves, sterile drape, hand hygiene and skin antiseptic. A timeout was performed prior to the initiation of the procedure. Patient was placed in the prone position on the fluoroscopy table. Ultrasound survey was performed with images stored and sent to PACs. The bilateral flank region prepped with Betadine, draped in usual sterile fashion, infiltrated locally with 1% lidocaine. Under real-time ultrasound guidance, a 21-gauge trocar needle was advanced into a posterior lower pole calyx of the left collecting system. Ultrasound image documentation was saved. Urine spontaneously returned through the needle. An 018 micro wire was then guided into the collecting system. A small incision was made with 11 blade scalpel, and the  needle was exchanged over a guidewire for transitional Envy system. The wire and the inner cannula were removed. Contrast injection confirmed appropriate positioning. The Accustick catheter was then exchanged over a guidewire for an 10 French pigtail drain, formed centrally within the left renal collecting system. Contrast injection confirms appropriate positioning and patency. The drain was then secured externally with 0 Prolene suture and placed to external drain bag. The right flank region was then infiltrated locally with 1% lidocaine. Under real-time ultrasound guidance, a 21-gauge trocar needle was advanced into a posterior lower pole calyx of the right collecting system. Ultrasound image documentation was saved. Urine spontaneously returned through the needle. An 018 micro wire was then guided into the collecting system. A small incision was made with 11 blade scalpel, and the needle was exchanged over a guidewire for transitional Envy system. The wire and the inner cannula were removed. Contrast injection confirmed appropriate positioning. The 4 French catheter was then exchanged over a guidewire for an 10 French pigtail drain, formed centrally within the right renal collecting system. Contrast injection confirms appropriate positioning and patency. The drain was then secured externally with 0 Prolene suture and placed to external drain bag. Patient tolerated the procedure well and remained hemodynamically stable throughout. No complications were encountered and no significant blood loss was encountered. EBL equals 0 IMPRESSION: Status post image guided placement of bilateral percutaneous nephrostomy. Signed, Dulcy Fanny. Dellia Nims, RPVI Vascular and Interventional Radiology Specialists [facility] Radiology Electronically Signed   By: Corrie Mckusick D.O.   On: 12/27/2019 16:45  Labs:  CBC: Recent Labs    12/26/19 0553 12/26/19 0714 12/27/19 0450 12/28/19 0503  WBC 4.2 4.0 6.4 7.7  HGB 6.7* 6.8*  9.7* 9.6*  HCT 20.1* 19.9* 28.2* 28.6*  PLT 204 200 199 205    COAGS: Recent Labs    10/17/19 1241 10/17/19 1241 10/17/19 1311 10/17/19 1311 11/07/19 1836 11/07/19 1853 11/10/19 1052 12/08/19 1258 12/27/19 0450  INR 1.5*   < > 1.6*   < > 1.2  --  1.3* 1.1 1.3*  APTT 43*  --  46*  --   --  31  --   --   --    < > = values in this interval not displayed.    BMP: Recent Labs    12/25/19 1634 12/26/19 0553 12/27/19 0450 12/28/19 0503  NA 136 136 137 138  K 3.5 3.1* 3.3* 3.7  CL 107 103 101 101  CO2 14* 17* 19* 21*  GLUCOSE 155* 103* 96 94  BUN 91* 85* 97* 98*  CALCIUM 6.7* 6.0* 6.3* 6.6*  CREATININE 7.55* 7.65* 7.98* 8.47*  GFRNONAA 7* 7* 7* 6*  GFRAA 8* 8* 8* 7*    LIVER FUNCTION TESTS: Recent Labs    11/07/19 1836 11/10/19 0932 12/25/19 0217 12/28/19 0503  BILITOT 0.6 0.4 0.3 0.3  AST 36 17 44* 28  ALT 53* 41 76* 8  ALKPHOS 180* 111 122 87  PROT 7.5 5.6* 6.7 6.0*  ALBUMIN 2.7* 1.8* 2.7* 2.2*    Assessment and Plan:  Bilateral hydronephrosis; s/p left ureteral stent placement and attempted but failed right ureteral stent placement; s/p bilateral PCN 9/27: Patient has had over 3 L output in the past 24H between bilateral PCNs and foley catheter. He is afebrile, WBC within normal range. Kidney function slightly worse and nephrology has spoken with the patient about possibly starting dialysis soon. Left PCN with clear red output, right PCN is clear.   Continue documenting output. Change dressings daily or as needed.   Other plans per primary teams. IR will continue to follow.   Electronically Signed: Soyla Dryer, AGACNP-BC [phone] 12/28/2019, 3:22 PM   I spent a total of 15 Minutes at the the patient's bedside AND on the patient's hospital floor or unit, greater than 50% of which was counseling/coordinating care for bilateral PCN drains.

## 2019-12-28 NOTE — Progress Notes (Signed)
[facility] Progress Note  Subjective:  Had 2.6 liters UOP over 9/27 charted.  Had bilateral neph tubes with IR on 9/27.  Noted he has been on fluid restriction. We discussed that if renal function worsens tomorrow would recommend HD; he does consent to HD if needed - we discussed risks/benefits/indications and understands may happen tomorrow.  No fluids running at this time.  Bloody output from neph tubes - one is starting to clear per pt.  Review of systems:  Denies shortness of breath  No chest pain  No n/v   Vitals:   12/27/19 1725 12/27/19 2148 12/28/19 0500 12/28/19 0525  BP: 104/69 108/67  100/63  Pulse: (!) 54 (!) 58  (!) 56  Resp: _0 Temp: 98.3 F (36.8 C) 98.5 F (36.9 C)  98.3 F (36.8 C)  TempSrc: Oral Oral  Oral  SpO2: 100% 100%  100%  Weight:   56.1 kg   Height:        Exam: Gen pale middle-aged WM, chron ill appearing, no distress  HENT NCAT  Chest clear bilat to bases unlabored S1S2 no rub Abd soft ntnd  GU foley cath in place Ext no leg or UE edema Neuro is alert, Ox 3; provides hx and follows commands Psych normal mood and affect    Home meds:  - cymbalta 30-60 hs/ neurontin 800 hs/ norco qid prn  - midodrine 2.5- 5 tid prn  - flomax 0.4 qd  - ensure tid bm/ MVI/ atarax prn      CT renal stone study noncon >  IMPRESSION: 1. Reaccumulation of small to moderate volume of fluid above the urinary bladder in this patient with suspected defect in the urinary bladder. No adjacent colonic thickening. Study limited by lack of rectal or vesicle contrast with respect to detection of colovesical fistula though the fat plane appears reasonably well preserved between the collection in the colon on the current study. 2. Interval development of moderate to marked bilateral hydroureteronephrosis despite presence of bilateral ureteral stents. 3. Aortic atherosclerosis.    VS BP 114/ 66, HR 70  RR 18  RA 100%  Temp 98   Na 131 K 5.5  CO2 7   BUN 81  Cr 7.19  Ca 8.0  Hb 8.9  WBC 7k  plt 220   COVID negative   UA many bact >50 rbc/ wbc, 100 prot, turbid     Assessment/ Plan: 1. AoCKD 4 - b/l creat 2.4- 2.8, eGFR 24- 86m/min. AKI presumably due to obstruction w/ new and severe bilat hydronephrosis by imaging.  Also with hypotension and pre-renal insults.  Not improved following nephrostomy tubes yet.  Urology did procedure 9/25 w/ L ureteral stent replaced but could not get R ureteral stent in position. Has some asterixis but o/w stable, borderline uremic. Making urine.  1. No indication for RRT at this time but plan for HD if renal function worsens - pt agrees 2. S/p bilateral nephrostomy tube placement  3. Trial of fluid bolus now and increased rate of fluids at 125 ml/hr 4. Continue indwelling foley for bladder outlet obstruction as well per urology  5. NPO after midnight tonight for potential HD catheter placement if needed tomorrow.  If does need [facility]   2. Met acidosis - improved.  On oral bicarb 3. Hyperkalemia - resolved  4. Hematuria/ bilat hydronephrosis/ hx ureteral stents - tortuous ureters per the OP note by urology.  5. Possible UTI - on empiric IV zosyn - per primary team   6. H/o CVA - left hemiparesis, SNF dependent 7. Hypocalcemia - replete with IV calcium gluconate again. Phos in AM 8. Normocytic anemia - check iron stores.   NPO after midnight tonight for HD catheter placement if needed tomorrow.  If does need HD would need to be transferred to [facility].    Recent Labs  Lab 12/27/19 0450 12/28/19 0503  K 3.3* 3.7  BUN 97* 98*  CREATININE 7.98* 8.47*  CALCIUM 6.3* 6.6*  HGB 9.7* 9.6*   Inpatient medications: . Chlorhexidine Gluconate Cloth  6 each Topical Daily  . feeding supplement (ENSURE ENLIVE)  237 mL Oral TID BM  . fluconazole  200 mg Oral Daily  . multivitamin with minerals  1 tablet Oral Daily  . nicotine  14 mg Transdermal Daily  . nutrition supplement  (JUVEN)  1 packet Oral BID BM  . sodium bicarbonate  1,300 mg Oral BID  . sodium chloride flush  5 mL Intracatheter Q8H  . tamsulosin  0.4 mg Oral Daily   . sodium chloride 75 mL/hr at 12/28/19 0600  . calcium gluconate 2,000 mg (12/28/19 1036)   acetaminophen **OR** acetaminophen, HYDROcodone-acetaminophen, hydrOXYzine   Lori C Foster, MD 12/28/2019  11:48 AM     

## 2019-12-28 NOTE — Hospital Course (Addendum)
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    CT abdomen on admission showed severe bilateral hydronephrosis and possible colovesical fistula. On admission creatinine 7.1 (baseline 2.4-2.8).   Admitted for acute on chronic kidney disease, severe anion gap metabolic acidosis, hyperkalemia, bilateral hydronephrosis.  Nephrology and urology consulting.  9/25, s/p left ureteral stent replaced but right ureteral stent unsuccessful.  Bilateral percutaneous nephrostomies placed by IR 12/27/19.  His creatinine peaked at 8.47 and started to show some signs of downtrending. Urine output remained adequate consistent with ATN/diuretic phase. He underwent repeat CT abdomen/pelvis on 12/30/2019 which revealed "Perivesical fluid collection is again noted, which currently measures 6.0 x 4.3 cm in the transverse orientation which may be slightly enlarged compared to prior exam".  IR was consulted for possible drain placement into fluid collection.  He underwent successful drain placement on 12/31/2019 and cultures were sent for testing. Infectious disease was also consulted and patient was started on meropenem and continued with fluconazole while cultures further matured. He was also evaluated by GI and tentative plans are for colonoscopy the day prior to fistula repair if able to be coordinated in that manner.

## 2019-12-29 DIAGNOSIS — N170 Acute kidney failure with tubular necrosis: Principal | ICD-10-CM

## 2019-12-29 DIAGNOSIS — N320 Bladder-neck obstruction: Secondary | ICD-10-CM

## 2019-12-29 LAB — CBC WITH DIFFERENTIAL/PLATELET
Abs Immature Granulocytes: 0.06 10*3/uL (ref 0.00–0.07)
Basophils Absolute: 0.1 10*3/uL (ref 0.0–0.1)
Basophils Relative: 1 %
Eosinophils Absolute: 0.7 10*3/uL — ABNORMAL HIGH (ref 0.0–0.5)
Eosinophils Relative: 7 %
HCT: 27 % — ABNORMAL LOW (ref 39.0–52.0)
Hemoglobin: 9 g/dL — ABNORMAL LOW (ref 13.0–17.0)
Immature Granulocytes: 1 %
Lymphocytes Relative: 16 %
Lymphs Abs: 1.5 10*3/uL (ref 0.7–4.0)
MCH: 30.8 pg (ref 26.0–34.0)
MCHC: 33.3 g/dL (ref 30.0–36.0)
MCV: 92.5 fL (ref 80.0–100.0)
Monocytes Absolute: 0.9 10*3/uL (ref 0.1–1.0)
Monocytes Relative: 11 %
Neutro Abs: 5.8 10*3/uL (ref 1.7–7.7)
Neutrophils Relative %: 64 %
Platelets: 206 10*3/uL (ref 150–400)
RBC: 2.92 MIL/uL — ABNORMAL LOW (ref 4.22–5.81)
RDW: 17.6 % — ABNORMAL HIGH (ref 11.5–15.5)
WBC: 9 10*3/uL (ref 4.0–10.5)
nRBC: 0 % (ref 0.0–0.2)

## 2019-12-29 LAB — BASIC METABOLIC PANEL
Anion gap: 16 — ABNORMAL HIGH (ref 5–15)
BUN: 89 mg/dL — ABNORMAL HIGH (ref 6–20)
CO2: 20 mmol/L — ABNORMAL LOW (ref 22–32)
Calcium: 6.8 mg/dL — ABNORMAL LOW (ref 8.9–10.3)
Chloride: 107 mmol/L (ref 98–111)
Creatinine, Ser: 7.98 mg/dL — ABNORMAL HIGH (ref 0.61–1.24)
GFR calc Af Amer: 8 mL/min — ABNORMAL LOW (ref >60)
GFR calc non Af Amer: 7 mL/min — ABNORMAL LOW (ref >60)
Glucose, Bld: 96 mg/dL (ref 70–99)
Potassium: 3.8 mmol/L (ref 3.5–5.1)
Sodium: 143 mmol/L (ref 135–145)

## 2019-12-29 LAB — MAGNESIUM: Magnesium: 2.2 mg/dL (ref 1.7–2.4)

## 2019-12-29 LAB — PHOSPHORUS: Phosphorus: 9 mg/dL — ABNORMAL HIGH (ref 2.5–4.6)

## 2019-12-29 MED ORDER — SEVELAMER CARBONATE 800 MG PO TABS
800.0000 mg | ORAL_TABLET | Freq: Three times a day (TID) | ORAL | Status: DC
  Administered 2019-12-29 – 2020-01-08 (×27): 800 mg via ORAL
  Filled 2019-12-29 (×31): qty 1

## 2019-12-29 MED ORDER — CALCIUM GLUCONATE-NACL 2-0.675 GM/100ML-% IV SOLN
2.0000 g | Freq: Once | INTRAVENOUS | Status: AC
  Administered 2019-12-29: 2000 mg via INTRAVENOUS
  Filled 2019-12-29: qty 100

## 2019-12-29 MED ORDER — SODIUM CHLORIDE 0.9 % IV SOLN
510.0000 mg | Freq: Once | INTRAVENOUS | Status: AC
  Administered 2019-12-29: 510 mg via INTRAVENOUS
  Filled 2019-12-29: qty 510

## 2019-12-29 NOTE — Progress Notes (Signed)
Patient ID: Chase [name], male   DOB: 1965/06/25, 54 y.o.   MRN: [id number]  4 Days Post-Op Subjective: Pt without new complaints.  Still complains of renal diet.  Objective: Vital signs in last 24 hours: Temp:  [98.4 F (36.9 C)-98.5 F (36.9 C)] 98.5 F (36.9 C) (09/29 0551) Pulse Rate:  [41-57] 41 (09/29 0551) Resp:  [15-17] 16 (09/29 0551) BP: (104-123)/(60-66) 119/60 (09/29 0551) SpO2:  [87 %-100 %] 87 % (09/29 0551)  Intake/Output from previous day: 09/28 0701 - 09/29 0700 In: 635.5 [I.V.:29.1; IV Piggyback:606.3] Out: 2525 [Urine:2525] Intake/Output this shift: No intake/output data recorded.  Physical Exam:  General: Alert and oriented Abd: B PCNs draining well.  Right with clear urine. Left with red tinged urine. GU: Foley in place and draining clear urine.  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 Definitive surgery will be scheduled in future per [name] once patient is medically optimized and nutritional status is improved. Will need bilateral ureteral stents in proper position to aid with identification during repair.  Once current acute medical issues are resolved/stabilized, will consider further imaging to assess location of left ureteral stent (Dr. Gloriann Loan not confident that it is in good position) and then make a decision as to how best to get indwelling stents in proper position for eventual surgical repair of colovesical fistula.  Pt will need to still make considerable improvements in medical status prior to this surgery.  [name] aware of patient's hospitalization.  2) AKI/CKD: Renal function  marginally improved.  UOP still good with 2.5 L. Nephrology following. Possible dialysis today although with renal function improving now, possibly could be delayed.  Will await nephrology recommendations/decision.  I again explained the importance of continuing a renal diet to the patient this morning. Continue nephrostomy drainage for now.  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  4) Funguria: Clinically asymptomatic.  Currently on fluconazole per ID recommendations considering recent invasive procedures.    LOS: 5 days   [name] 12/29/2019, 7:16 AM

## 2019-12-29 NOTE — Progress Notes (Signed)
Patient c/o 10/10 generalized joint pain all over, requesting PRN norco/vicoden pain medication. HR is in the 40's, patient reports that's normal for him and he is asymptomatic. Notified MD Girguis made him aware of situation. MD says okay to give PRN pain medication. 1tablet given.

## 2019-12-29 NOTE — Plan of Care (Signed)

## 2019-12-29 NOTE — Progress Notes (Signed)
Patient's HR in 97s and asymptomatic. Paged M. Sharlet Salina. She stated to continue to monitor and please notify if patient becomes symptomatic.

## 2019-12-29 NOTE — TOC Progression Note (Signed)
Transition of Care [facility]) - Progression Note    Patient Details  Name: Angas Isabell MRN: [id number] Date of Birth: 01/04/1966  [facility] CM/SW Contact  Purcell Mouton, RN Phone Number: 12/29/2019, 12:06 PM  Clinical Narrative:    Pt is from [facility] and will return when stable. TOC will continue to follow.    Expected Discharge Plan: [place] Barriers to Discharge: Continued Medical Work up  Expected Discharge Plan and Services Expected Discharge Plan: [place] In-house Referral: Clinical Social Work     Living arrangements for the past 2 months: [place] Expected Discharge Date:  (unknown)                                     Social Determinants of Health (SDOH) Interventions    Readmission Risk Interventions Readmission Risk Prevention Plan 12/25/2019 11/19/2019  Transportation Screening Complete Complete  Medication Review [employer] photographer) Complete Complete  PCP or Specialist appointment within 3-5 days of discharge - Complete  HRI or [place] - Complete  SW Recovery Care/Counseling Consult - Complete  [place] - Complete  Some recent data might be hidden

## 2019-12-29 NOTE — Progress Notes (Signed)
Referring Physician(s): Whitfield  Supervising Physician: Aletta Edouard  Patient Status:  [facility] - In-pt  Chief Complaint:  Flank pain, bilateral hydronephrosis  Subjective: Patient complaining of some diffuse joint pain; also not pleased with renal diet stating it is too bland and has no taste; has some mild tenderness at nephrostomy insertion sites; denies nausea or vomiting   Allergies: Nsaids  Medications: Prior to Admission medications   Medication Sig Start Date End Date Taking? Authorizing Provider  cyanocobalamin (,VITAMIN B-12,) 1000 MCG/ML injection Inject 1,000 mcg into the muscle every 30 (thirty) days.  12/28/13  Yes [provider]  docusate sodium (COLACE) 100 MG capsule Take 1 capsule (100 mg total) by mouth 2 (two) times daily as needed for mild constipation. 11/19/19  Yes Antonieta Pert, MD  DULoxetine (CYMBALTA) 30 MG capsule Take 30-60 mg by mouth See admin instructions. Take 2 capsules in the morning and 1 capsule in the evening 08/12/17  Yes [provider]  gabapentin (NEURONTIN) 400 MG capsule Take 800 mg by mouth at bedtime.   Yes [provider]  HYDROcodone-acetaminophen (NORCO/VICODIN) 5-325 MG tablet Take 1-2 tablets by mouth every 4 (four) hours as needed for up to 10 doses for moderate pain or severe pain (1 moderate, 2 severe). 11/19/19  Yes Antonieta Pert, MD  hydrOXYzine (ATARAX/VISTARIL) 10 MG tablet Take 1 tablet (10 mg total) by mouth 3 (three) times daily as needed for itching or anxiety. 10/26/19  Yes Mikhail, Maryann, DO  midodrine (PROAMATINE) 2.5 MG tablet Take 2 tablets (5 mg total) by mouth 3 (three) times daily with meals. Patient taking differently: Take 2.5-5 mg by mouth 3 (three) times daily as needed (hypotension).  11/19/19  Yes Antonieta Pert, MD  Multiple Vitamin (MULTIVITAMIN WITH MINERALS) TABS tablet Take 1 tablet by mouth daily. 10/27/19  Yes Mikhail, Velta Addison, DO  tamsulosin (FLOMAX) 0.4 MG CAPS capsule Take 0.4 mg by  mouth daily.    Yes [provider]  feeding supplement, ENSURE ENLIVE, (ENSURE ENLIVE) LIQD Take 237 mLs by mouth 3 (three) times daily between meals. Patient not taking: Reported on 12/24/2019 10/26/19   Cristal Ford, DO     Vital Signs: BP 119/60 (BP Location: Left Arm)   Pulse (!) 41   Temp 98.5 F (36.9 C)   Resp 16   Ht 5\' 3"  (1.6 m)   Wt 123 lb 11.2 oz (56.1 kg)   SpO2 (!) 87%   BMI 21.91 kg/m   Physical Exam awake, alert.  Right and left nephrostomies are intact, output from left 1.5 L blood-tinged urine, right 875 cc yellow urine  Imaging: US RENAL  Result Date: 12/25/2019 CLINICAL DATA:  Hydronephrosis. EXAM: RENAL / URINARY TRACT ULTRASOUND COMPLETE COMPARISON:  November 12, 2019 FINDINGS: Right Kidney: Renal measurements: 11.3 x 6.4 x 7.9 cm = volume: 300 mL. Marked hydronephrosis. Increased echogenicity of the renal cortex. Left Kidney: Renal measurements: 10.8 x 9.7 x 10.2 cm = volume: 559 mL. Marked hydronephrosis. Increased echogenicity of the renal cortex. Echogenic material within the lower pole calices may represent residual contrast or inspissated debris/blood products. Stent artifact is seen in the left renal pelvis. Bladder: Mildly distended around urinary Foley. Again seen is isoechoic avascular material within the dependent portion of the urinary bladder. Other: None. IMPRESSION: 1. Marked bilateral hydronephrosis, worse on the left. 2. Increased cortical echogenicity of the kidneys, consistent with medical renal disease. 3. Left ureteral stent is seen. The right ureteral stent is not seen sonographically. 4. Mildly  distended urinary bladder around urinary Foley with isoechoic avascular material within the dependent portion of the urinary bladder, possibly representing debris or organizing blood products. 5. Increased echogenicity within the left lower pole renal calices may represent blood products or inspissated debris. Electronically Signed   By: Fidela Salisbury M.D.   On: 12/25/2019 16:58   DG C-Arm 1-60 Min-No Report  Result Date: 12/25/2019 Fluoroscopy was utilized by the requesting physician.  No radiographic interpretation.   IR NEPHROSTOMY PLACEMENT LEFT  Result Date: 12/27/2019 INDICATION: 54 year old male with obstructed bilateral collecting system EXAM: IR NEPHROSTOMY PLACEMENT RIGHT; IR NEPHROSTOMY PLACEMENT LEFT COMPARISON:  None. MEDICATIONS: 400 mg Cipro; The antibiotic was administered in an appropriate time frame prior to skin puncture. ANESTHESIA/SEDATION: Fentanyl 1.0 mcg IV; Versed 50 mg IV Moderate Sedation Time:  16 minutes The patient was continuously monitored during the procedure by the interventional radiology nurse under my direct supervision. CONTRAST:  48mL OMNIPAQUE IOHEXOL 300 MG/ML SOLN - administered into the collecting system(s) FLUOROSCOPY TIME:  Fluoroscopy Time: 2 minutes 0 seconds (60 mGy). COMPLICATIONS: None PROCEDURE: Informed written consent was obtained from the patient after a thorough discussion of the procedural risks, benefits and alternatives. All questions were addressed. Maximal Sterile Barrier Technique was utilized including caps, mask, sterile gowns, sterile gloves, sterile drape, hand hygiene and skin antiseptic. A timeout was performed prior to the initiation of the procedure. Patient was placed in the prone position on the fluoroscopy table. Ultrasound survey was performed with images stored and sent to PACs. The bilateral flank region prepped with Betadine, draped in usual sterile fashion, infiltrated locally with 1% lidocaine. Under real-time ultrasound guidance, a 21-gauge trocar needle was advanced into a posterior lower pole calyx of the left collecting system. Ultrasound image documentation was saved. Urine spontaneously returned through the needle. An 018 micro wire was then guided into the collecting system. A small incision was made with 11 blade scalpel, and the needle was exchanged over a  guidewire for transitional Envy system. The wire and the inner cannula were removed. Contrast injection confirmed appropriate positioning. The Accustick catheter was then exchanged over a guidewire for an 10 French pigtail drain, formed centrally within the left renal collecting system. Contrast injection confirms appropriate positioning and patency. The drain was then secured externally with 0 Prolene suture and placed to external drain bag. The right flank region was then infiltrated locally with 1% lidocaine. Under real-time ultrasound guidance, a 21-gauge trocar needle was advanced into a posterior lower pole calyx of the right collecting system. Ultrasound image documentation was saved. Urine spontaneously returned through the needle. An 018 micro wire was then guided into the collecting system. A small incision was made with 11 blade scalpel, and the needle was exchanged over a guidewire for transitional Envy system. The wire and the inner cannula were removed. Contrast injection confirmed appropriate positioning. The 4 French catheter was then exchanged over a guidewire for an 10 French pigtail drain, formed centrally within the right renal collecting system. Contrast injection confirms appropriate positioning and patency. The drain was then secured externally with 0 Prolene suture and placed to external drain bag. Patient tolerated the procedure well and remained hemodynamically stable throughout. No complications were encountered and no significant blood loss was encountered. EBL equals 0 IMPRESSION: Status post image guided placement of bilateral percutaneous nephrostomy. Signed, Dulcy Fanny. Dellia Nims, RPVI [facility] Radiology Electronically Signed   By: Corrie Mckusick D.O.   On: 12/27/2019 16:45  IR NEPHROSTOMY PLACEMENT RIGHT  Result Date: 12/27/2019 INDICATION: 54 year old male with obstructed bilateral collecting system EXAM: IR NEPHROSTOMY PLACEMENT  RIGHT; IR NEPHROSTOMY PLACEMENT LEFT COMPARISON:  None. MEDICATIONS: 400 mg Cipro; The antibiotic was administered in an appropriate time frame prior to skin puncture. ANESTHESIA/SEDATION: Fentanyl 1.0 mcg IV; Versed 50 mg IV Moderate Sedation Time:  16 minutes The patient was continuously monitored during the procedure by the interventional radiology nurse under my direct supervision. CONTRAST:  68mL OMNIPAQUE IOHEXOL 300 MG/ML SOLN - administered into the collecting system(s) FLUOROSCOPY TIME:  Fluoroscopy Time: 2 minutes 0 seconds (60 mGy). COMPLICATIONS: None PROCEDURE: Informed written consent was obtained from the patient after a thorough discussion of the procedural risks, benefits and alternatives. All questions were addressed. Maximal Sterile Barrier Technique was utilized including caps, mask, sterile gowns, sterile gloves, sterile drape, hand hygiene and skin antiseptic. A timeout was performed prior to the initiation of the procedure. Patient was placed in the prone position on the fluoroscopy table. Ultrasound survey was performed with images stored and sent to PACs. The bilateral flank region prepped with Betadine, draped in usual sterile fashion, infiltrated locally with 1% lidocaine. Under real-time ultrasound guidance, a 21-gauge trocar needle was advanced into a posterior lower pole calyx of the left collecting system. Ultrasound image documentation was saved. Urine spontaneously returned through the needle. An 018 micro wire was then guided into the collecting system. A small incision was made with 11 blade scalpel, and the needle was exchanged over a guidewire for transitional Envy system. The wire and the inner cannula were removed. Contrast injection confirmed appropriate positioning. The Accustick catheter was then exchanged over a guidewire for an 10 French pigtail drain, formed centrally within the left renal collecting system. Contrast injection confirms appropriate positioning and patency.  The drain was then secured externally with 0 Prolene suture and placed to external drain bag. The right flank region was then infiltrated locally with 1% lidocaine. Under real-time ultrasound guidance, a 21-gauge trocar needle was advanced into a posterior lower pole calyx of the right collecting system. Ultrasound image documentation was saved. Urine spontaneously returned through the needle. An 018 micro wire was then guided into the collecting system. A small incision was made with 11 blade scalpel, and the needle was exchanged over a guidewire for transitional Envy system. The wire and the inner cannula were removed. Contrast injection confirmed appropriate positioning. The 4 French catheter was then exchanged over a guidewire for an 10 French pigtail drain, formed centrally within the right renal collecting system. Contrast injection confirms appropriate positioning and patency. The drain was then secured externally with 0 Prolene suture and placed to external drain bag. Patient tolerated the procedure well and remained hemodynamically stable throughout. No complications were encountered and no significant blood loss was encountered. EBL equals 0 IMPRESSION: Status post image guided placement of bilateral percutaneous nephrostomy. Signed, Dulcy Fanny. Dellia Nims, RPVI [facility] Radiology Electronically Signed   By: Corrie Mckusick D.O.   On: 12/27/2019 16:45    Labs:  CBC: Recent Labs    12/26/19 0714 12/27/19 0450 12/28/19 0503 12/29/19 0412  WBC 4.0 6.4 7.7 9.0  HGB 6.8* 9.7* 9.6* 9.0*  HCT 19.9* 28.2* 28.6* 27.0*  PLT 200 199 205 206    COAGS: Recent Labs    10/17/19 1241 10/17/19 1241 10/17/19 1311 10/17/19 1311 11/07/19 1836 11/07/19 1853 11/10/19 1052 12/08/19 1258 12/27/19 0450  INR 1.5*   < > 1.6*   < >  1.2  --  1.3* 1.1 1.3*  APTT 43*  --  46*  --   --  31  --   --   --    < > = values in this interval not displayed.     BMP: Recent Labs    12/26/19 0553 12/27/19 0450 12/28/19 0503 12/29/19 0412  NA 136 137 138 143  K 3.1* 3.3* 3.7 3.8  CL 103 101 101 107  CO2 17* 19* 21* 20*  GLUCOSE 103* 96 94 96  BUN 85* 97* 98* 89*  CALCIUM 6.0* 6.3* 6.6* 6.8*  CREATININE 7.65* 7.98* 8.47* 7.98*  GFRNONAA 7* 7* 6* 7*  GFRAA 8* 8* 7* 8*    LIVER FUNCTION TESTS: Recent Labs    11/07/19 1836 11/10/19 0932 12/25/19 0217 12/28/19 0503  BILITOT 0.6 0.4 0.3 0.3  AST 36 17 44* 28  ALT 53* 41 76* 8  ALKPHOS 180* 111 122 87  PROT 7.5 5.6* 6.7 6.0*  ALBUMIN 2.7* 1.8* 2.7* 2.2*    Assessment and Plan: Patient with history of Hodgkin's lymphoma/BPH/colovesical fistula, chronic kidney disease, bladder outlet obstruction, funguria, obstructive bilateral hydronephrosis despite bilateral ureteral stents, status post bilateral nephrostomies on 9/27; afebrile, WBC normal, hemoglobin 9 (9.6), creatinine 7.98 down from 8.47, nephrostomy urine cultures growing few yeast; continue PCNs - further plans as outlined by urology.   Electronically Signed: D. Rowe Robert, PA-C 12/29/2019, 10:56 AM   I spent a total of 15 minutes at the the patient's bedside AND on the patient's hospital floor or unit, greater than 50% of which was counseling/coordinating care for bilateral percutaneous nephrostomies    Patient ID: Chase [name], male   DOB: 03-Apr-1965, 54 y.o.   MRN: [id number]

## 2019-12-29 NOTE — Progress Notes (Signed)
[facility] Progress Note  Subjective:  Had 2.5 liters UOP over 9/28 charted.  Feels ok today.  Hadn't tried to eat yet today - not excited about renal diet.   Review of systems:    Denies shortness of breath  No chest pain  No n/v   Vitals:   12/28/19 0525 12/28/19 1222 12/28/19 2157 12/29/19 0551  BP: 100/63 123/66 104/61 119/60  Pulse: (!) 56 (!) 57 (!) 52 (!) 41  Resp: 16 15 17 16   Temp: 98.3 F (36.8 C) 98.4 F (36.9 C) 98.5 F (36.9 C) 98.5 F (36.9 C)  TempSrc: Oral Oral    SpO2: 100% 100% 100% (!) 87%  Weight:      Height:        Exam: Gen adult male in bed in NAD; chronically ill appearing  HENT NCAT  Chest clear bilat to bases unlabored S1S2 no rub Abd soft ntnd  GU foley cath in place; neph tubes in place Ext no leg or UE edema Neuro is alert, Ox 3; provides hx and follows commands Psych normal mood and affect    Home meds:  - cymbalta 30-60 hs/ neurontin 800 hs/ norco qid prn  - midodrine 2.5- 5 tid prn  - flomax 0.4 qd  - ensure tid bm/ MVI/ atarax prn      CT renal stone study noncon >  IMPRESSION: 1. Reaccumulation of small to moderate volume of fluid above the urinary bladder in this patient with suspected defect in the urinary bladder. No adjacent colonic thickening. Study limited by lack of rectal or vesicle contrast with respect to detection of colovesical fistula though the fat plane appears reasonably well preserved between the collection in the colon on the current study. 2. Interval development of moderate to marked bilateral hydroureteronephrosis despite presence of bilateral ureteral stents. 3. Aortic atherosclerosis.    VS BP 114/ 66, HR 70  RR 18  RA 100%  Temp 98   Na 131 K 5.5  CO2 7  BUN 81  Cr 7.19  Ca 8.0  Hb 8.9  WBC 7k  plt 220   COVID negative   UA many bact >50 rbc/ wbc, 100 prot, turbid     Assessment/ Plan: 1. AoCKD 4 - b/l creat 2.4- 2.8, eGFR 24- 67m/min. AKI presumably due to obstruction w/ new and  severe bilat hydronephrosis by imaging.  Also with hypotension and pre-renal insults. Urology did procedure 9/25 w/ L ureteral stent replaced but could not get R ureteral stent in position.  Had bilateral neph tubes with IR on 9/27.  Decreased renal reserve with CKD stage IV 1. Some improvement with neph tubes and fluids 2. No indication for RRT at this time but patient would want HD if renal function worsens  3. Continue normal saline at 125 mL/hr  4. Continue indwelling foley for bladder outlet obstruction as well per urology   2. Met acidosis - improved on oral bicarb 3. Hyperkalemia - resolved  4. Hematuria/ bilat hydronephrosis/ hx ureteral stents - tortuous ureters per the OP note by urology.  5. Possible UTI - on abx per primary team   6. H/o CVA - left hemiparesis, SNF dependent 7. Hypocalcemia - repleted with IV calcium gluconate.  Check intact PTH.  8. hyperphos - start renvela with meals. Renal diet 8. Normocytic anemia - iron deficient. Replete with feraheme   Recent Labs  Lab 12/28/19 0503 12/29/19 0412  K 3.7 3.8  BUN 98* 89*  CREATININE 8.47*  7.98*  CALCIUM 6.6* 6.8*  PHOS  --  9.0*  HGB 9.6* 9.0*   Inpatient medications: . Chlorhexidine Gluconate Cloth  6 each Topical Daily  . feeding supplement (ENSURE ENLIVE)  237 mL Oral TID BM  . fluconazole  200 mg Oral Daily  . multivitamin with minerals  1 tablet Oral Daily  . nicotine  14 mg Transdermal Daily  . nutrition supplement (JUVEN)  1 packet Oral BID BM  . sodium bicarbonate  1,300 mg Oral BID  . sodium chloride flush  5 mL Intracatheter Q8H  . tamsulosin  0.4 mg Oral Daily   . sodium chloride 125 mL/hr at 12/29/19 0811   acetaminophen **OR** acetaminophen, HYDROcodone-acetaminophen, hydrOXYzine   Claudia Desanctis, MD 12/29/2019  12:32 PM

## 2019-12-29 NOTE — Progress Notes (Signed)
PT Cancellation Note  Patient Details Name: Chase [name] MRN: [id number] DOB: 03-28-1966   Cancelled Treatment:    Reason Eval/Treat Not Completed:  Pt declined to participate on today. Will check back another day.    [facility]  Office: [phone] Pager: [phone]

## 2019-12-29 NOTE — Progress Notes (Signed)
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    CT abdomen showed severe bilateral hydronephrosis and possible colovesical fistula, creatinine 7.1 (baseline 2.4-2.8).  Admitted for acute on chronic kidney disease, severe anion gap metabolic acidosis, hyperkalemia, bilateral hydronephrosis.  Nephrology and urology consulting.  9/25, s/p left ureteral stent replaced but right ureteral stent unsuccessful.  Bilateral percutaneous nephrostomies placed by IR 12/27/19.   Interval History:  No events overnight.  Apparently has a big problem with his renal diet; I tried discussing this with him and he told me I was "rude". He's also brought it up with nephrology and urology today.  Regardless, his renal function was mildly improved today and he overall remains relatively asymptomatic and is making adequate UOP; informed him of all this and he was mildly happy to hear this. Continued to fixate on the renal diet.   Informed him, for now we will just continue to watch renal function and hopeful for continued improvement but if not, the plan would still be to start pursuing dialysis.  He is still in agreement.  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  Baseline creatinine 2.4-2.8.  Presented with creatinine of 7.1, potassium 5.5 and bicarbonate 7.  CT abdomen showed bilateral severe hydronephrosis.  Foley catheter in place.   - On 9/25 per Urology s/p left ureteral stent replaced but could not get right ureteral stent.  - now s/p B/L PCN by IR on 9/27.  - Suspecting obstruction as the primary issue which may have led to ATN and now in the diuretic phase - some improvement in renal function today (creat and BUN).  No other criteria at this time to warrant dialysis and will continue monitoring for if and when he does meet criteria -Continue monitoring urine output  Hyperkalemia/hypokalemia: Hyperkalemia resolved.  Treating as indicated   Anion gap metabolic acidosis: Bicarbonate drip discontinued and started oral bicarbonate 9/27.  Hypocalcemia: Replete and recheck as needed - follow up iPTH  Hyperphosphatemia -Renvela started per nephrology  Bilateral hydronephrosis/ureteral stent/hematuria: Urology input appreciated. On 9/25 s/p left ureteral stent replaced but could not get right ureteral stent.  Now bilateral nephrostomy tube placement by IR 9/27.    Possible complicated UTI: In a patient with recent urological interventions, dysuria although he is afebrile and without leukocytosis.  Previous urine cultures had shown E. coli and Pseudomonas.  Patient was started on IV Zosyn.  However urine culture now shows 70 K colonies of yeast.  Discussed with infectious disease MD on call who recommended starting fluconazole and IV Zosyn discontinued.  Acute blood loss anemia complicating chronic microcytic anemia Hemoglobin has dropped from 8 g range to 6.7-6.8, confirmed on redraw.  S/p 2 units PRBC and hemoglobin up  to 9.7.  Follow CBC in a.m.  Anxiety and depression: Cymbalta on hold due to renal insufficiency  History of CVA  with residual left hemiparesis: SNF resident.  Therapies evaluation when able.  Body mass index is 21.49 kg/m.  Nutritional Status Nutrition Problem: Inadequate oral intake Etiology: acute illness (bilateral hydronephrosis) Signs/Symptoms: meal completion < 25% Interventions: Ensure Enlive (each supplement provides 350kcal and 20 grams of protein), MVI, Juven  Antimicrobials: Zosyn 12/25/2019>>9/27 Fluconazole 9/27>> present   DVT prophylaxis: SCD Code Status: Full Family Communication: None present Disposition Plan: Status is: Inpatient  Remains inpatient appropriate because:Ongoing diagnostic testing needed not appropriate for outpatient work up, IV treatments appropriate due to intensity of illness or inability to take PO and Inpatient level of care appropriate due to severity of illness   Dispo: The patient is from: Home              Anticipated d/c is to: SNF              Anticipated d/c date is: > 3 days              Patient currently is not medically stable to d/c.  Objective: Blood pressure 123/70, pulse (!) 57, temperature 98.2 F (36.8 C), temperature source Oral, resp. rate 20, height 5\' 3"  (1.6 m), weight 56.1 kg, SpO2 100 %.  Examination: General appearance: alert, cooperative and no distress Head: Normocephalic, without obvious abnormality, atraumatic Eyes: EOMI Lungs: clear to auscultation bilaterally Heart: regular rate and rhythm and S1, S2 normal Abdomen: normal findings: bowel sounds normal and soft, non-tender and Left nephrostomy tube noted with bloody urine.  Right nephrostomy tube clear urine and Foley catheter clear urine Extremities: no edema Skin: mobility and turgor normal Neurologic: no focal deficits  Consultants:   Nephrology  Procedures:   none  Data Reviewed: I have personally reviewed following labs and imaging studies Results for orders placed or performed during the hospital encounter of 12/24/19 (from the past 24 hour(s))   Basic metabolic panel     Status: Abnormal   Collection Time: 12/29/19  4:12 AM  Result Value Ref Range   Sodium 143 135 - 145 mmol/L   Potassium 3.8 3.5 - 5.1 mmol/L   Chloride 107 98 - 111 mmol/L   CO2 20 (L) 22 - 32 mmol/L   Glucose, Bld 96 70 - 99 mg/dL   BUN 89 (H) 6 - 20 mg/dL   Creatinine, Ser 7.98 (H) 0.61 - 1.24 mg/dL   Calcium 6.8 (L) 8.9 - 10.3 mg/dL   GFR calc non Af Amer 7 (L) >60 mL/min   GFR calc Af Amer 8 (L) >60 mL/min   Anion gap 16 (H) 5 - 15  Phosphorus     Status: Abnormal   Collection Time: 12/29/19  4:12 AM  Result Value Ref Range   Phosphorus 9.0 (H) 2.5 - 4.6 mg/dL  CBC with Differential/Platelet     Status: Abnormal   Collection Time: 12/29/19  4:12 AM  Result Value Ref Range   WBC 9.0 4.0 - 10.5 K/uL   RBC 2.92 (L) 4.22 - 5.81 MIL/uL   Hemoglobin 9.0 (L) 13.0 - 17.0 g/dL   HCT 27.0 (L) 39 - 52 %   MCV 92.5 80.0 - 100.0 fL   MCH 30.8 26.0 - 34.0 pg   MCHC 33.3 30.0 - 36.0 g/dL   RDW 17.6 (H) 11.5 - 15.5 %   Platelets 206 150 - 400 K/uL   nRBC 0.0 0.0 -  0.2 %   Neutrophils Relative % 64 %   Neutro Abs 5.8 1.7 - 7.7 K/uL   Lymphocytes Relative 16 %   Lymphs Abs 1.5 0.7 - 4.0 K/uL   Monocytes Relative 11 %   Monocytes Absolute 0.9 0 - 1 K/uL   Eosinophils Relative 7 %   Eosinophils Absolute 0.7 (H) 0 - 0 K/uL   Basophils Relative 1 %   Basophils Absolute 0.1 0 - 0 K/uL   Immature Granulocytes 1 %   Abs Immature Granulocytes 0.06 0.00 - 0.07 K/uL  Magnesium     Status: None   Collection Time: 12/29/19  4:12 AM  Result Value Ref Range   Magnesium 2.2 1.7 - 2.4 mg/dL    Recent Results (from the past 240 hour(s))  Urine culture     Status: Abnormal   Collection Time: 12/24/19 10:33 AM   Specimen: Urine, Clean Catch  Result Value Ref Range Status   Specimen Description   Final    URINE, CLEAN CATCH Performed at [facility], [place] [address], [place], [place] [address]    Special Requests   Final    NONE Performed at  [facility], [place] [address] [place],  [address]    Culture 70,000 COLONIES/mL YEAST (A)  Final   Report Status 12/26/2019 FINAL  Final  Respiratory Panel by RT PCR (Flu A&B, Covid) - Nasopharyngeal Swab     Status: None   Collection Time: 12/24/19  2:22 PM   Specimen: Nasopharyngeal Swab  Result Value Ref Range Status   SARS Coronavirus 2 by RT PCR NEGATIVE NEGATIVE Final    Comment: (NOTE) SARS-CoV-2 target nucleic acids are NOT DETECTED.  The SARS-CoV-2 RNA is generally detectable in upper respiratoy specimens during the acute phase of infection. The lowest concentration of SARS-CoV-2 viral copies this assay can detect is 131 copies/mL. A negative result does not preclude SARS-Cov-2 infection and should not be used as the sole basis for treatment or other patient management decisions. A negative result may occur with  improper specimen collection/handling, submission of specimen other than nasopharyngeal swab, presence of viral mutation(s) within the areas targeted by this assay, and inadequate number of viral copies (<131 copies/mL). A negative result must be combined with clinical observations, patient history, and epidemiological information. The expected result is Negative.  Fact Sheet for Patients:  PinkCheek.be  Fact Sheet for Healthcare Providers:  GravelBags.it  This test is no t yet approved or cleared by the [place] FDA and  has been authorized for detection and/or diagnosis of SARS-CoV-2 by FDA under an Emergency Use Authorization (EUA). This EUA will remain  in effect (meaning this test can be used) for the duration of the COVID-19 declaration under Section 564(b)(1) of the Act, 21 U.S.C. section 360bbb-3(b)(1), unless the authorization is terminated or revoked sooner.     Influenza A by PCR NEGATIVE NEGATIVE Final   Influenza B by PCR NEGATIVE NEGATIVE Final    Comment:  (NOTE) The Xpert Xpress SARS-CoV-2/FLU/RSV assay is intended as an aid in  the diagnosis of influenza from Nasopharyngeal swab specimens and  should not be used as a sole basis for treatment. Nasal washings and  aspirates are unacceptable for Xpert Xpress SARS-CoV-2/FLU/RSV  testing.  Fact Sheet for Patients: PinkCheek.be  Fact Sheet for Healthcare Providers: GravelBags.it  This test is not yet approved or cleared by the [place] FDA and  has been authorized for detection and/or diagnosis of SARS-CoV-2 by  FDA under an Emergency Use Authorization (EUA). This EUA will remain  in effect (meaning this test can be used) for the duration of the  Covid-19 declaration under Section 564(b)(1) of the Act, 21  U.S.C. section 360bbb-3(b)(1), unless the authorization is  terminated or revoked. Performed at [facility], [place] [address], [place], [place] [address]   Aerobic/Anaerobic Culture (surgical/deep wound)     Status: None (Preliminary result)   Collection Time: 12/27/19  4:26 PM   Specimen: Abscess  Result Value Ref Range Status   Specimen Description   Final    ABSCESS LEFT KIDNEY Performed at [place] [address], [place], [place] [address]    Special Requests   Final    NONE Performed at [facility], [place] [address], [place], [place] [address]    Gram Stain   Final    MODERATE WBC PRESENT, PREDOMINANTLY MONONUCLEAR FEW YEAST Performed at [facility], [place] [address], [place], [place] [address]    Culture   Final    FEW CANDIDA ALBICANS NO ANAEROBES ISOLATED; CULTURE IN PROGRESS FOR 5 DAYS    Report Status PENDING  Incomplete  Aerobic/Anaerobic Culture (surgical/deep wound)     Status: None (Preliminary result)   Collection Time: 12/27/19  4:36 PM   Specimen: Abscess  Result Value Ref Range Status   Specimen Description   Final    ABSCESS  RIGHT KIDNEY Performed at [place] [address], [place], [place] [address]    Special Requests   Final    NONE Performed at [facility], [place] [address] [place], [place] [address]    Gram Stain   Final    RARE WBC PRESENT,BOTH PMN AND MONONUCLEAR NO ORGANISMS SEEN    Culture   Final    FEW YEAST IDENTIFICATION TO FOLLOW Performed at [facility], [place] [address] [place], [place] [address]    Report Status PENDING  Incomplete     Radiology Studies: IR NEPHROSTOMY PLACEMENT LEFT  Result Date: 12/27/2019 INDICATION: 54 year old male with obstructed bilateral collecting system EXAM: IR NEPHROSTOMY PLACEMENT RIGHT; IR NEPHROSTOMY PLACEMENT LEFT COMPARISON:  None. MEDICATIONS: 400 mg Cipro; The antibiotic was administered in an appropriate time frame prior to skin puncture. ANESTHESIA/SEDATION: Fentanyl 1.0 mcg IV; Versed 50 mg IV Moderate Sedation Time:  16 minutes The patient was continuously monitored during the procedure by the interventional radiology nurse under my direct supervision. CONTRAST:  39mL OMNIPAQUE IOHEXOL 300 MG/ML SOLN - administered into the collecting system(s) FLUOROSCOPY TIME:  Fluoroscopy Time: 2 minutes 0 seconds (60 mGy). COMPLICATIONS: None PROCEDURE: Informed written consent was obtained from the patient after a thorough discussion of the procedural risks, benefits and alternatives. All questions were addressed. Maximal Sterile Barrier Technique was utilized including caps, mask, sterile gowns, sterile gloves, sterile drape, hand hygiene and skin antiseptic. A timeout was performed prior to the initiation of the procedure. Patient was placed in the prone position on the fluoroscopy table. Ultrasound survey was performed with images stored and sent to PACs. The bilateral flank region prepped with Betadine, draped in usual sterile fashion, infiltrated locally with 1% lidocaine. Under real-time ultrasound guidance, a  21-gauge trocar needle was advanced into a posterior lower pole calyx of the left collecting system. Ultrasound image documentation was saved. Urine spontaneously returned through the needle. An 018 micro wire was then guided into the collecting system. A small incision was made with 11 blade scalpel,  and the needle was exchanged over a guidewire for transitional Envy system. The wire and the inner cannula were removed. Contrast injection confirmed appropriate positioning. The Accustick catheter was then exchanged over a guidewire for an 10 French pigtail drain, formed centrally within the left renal collecting system. Contrast injection confirms appropriate positioning and patency. The drain was then secured externally with 0 Prolene suture and placed to external drain bag. The right flank region was then infiltrated locally with 1% lidocaine. Under real-time ultrasound guidance, a 21-gauge trocar needle was advanced into a posterior lower pole calyx of the right collecting system. Ultrasound image documentation was saved. Urine spontaneously returned through the needle. An 018 micro wire was then guided into the collecting system. A small incision was made with 11 blade scalpel, and the needle was exchanged over a guidewire for transitional Envy system. The wire and the inner cannula were removed. Contrast injection confirmed appropriate positioning. The 4 French catheter was then exchanged over a guidewire for an 10 French pigtail drain, formed centrally within the right renal collecting system. Contrast injection confirms appropriate positioning and patency. The drain was then secured externally with 0 Prolene suture and placed to external drain bag. Patient tolerated the procedure well and remained hemodynamically stable throughout. No complications were encountered and no significant blood loss was encountered. EBL equals 0 IMPRESSION: Status post image guided placement of bilateral percutaneous nephrostomy.  Signed, Dulcy Fanny. Dellia Nims, RPVI [facility] Radiology Electronically Signed   By: Corrie Mckusick D.O.   On: 12/27/2019 16:45   IR NEPHROSTOMY PLACEMENT RIGHT  Result Date: 12/27/2019 INDICATION: 54 year old male with obstructed bilateral collecting system EXAM: IR NEPHROSTOMY PLACEMENT RIGHT; IR NEPHROSTOMY PLACEMENT LEFT COMPARISON:  None. MEDICATIONS: 400 mg Cipro; The antibiotic was administered in an appropriate time frame prior to skin puncture. ANESTHESIA/SEDATION: Fentanyl 1.0 mcg IV; Versed 50 mg IV Moderate Sedation Time:  16 minutes The patient was continuously monitored during the procedure by the interventional radiology nurse under my direct supervision. CONTRAST:  26mL OMNIPAQUE IOHEXOL 300 MG/ML SOLN - administered into the collecting system(s) FLUOROSCOPY TIME:  Fluoroscopy Time: 2 minutes 0 seconds (60 mGy). COMPLICATIONS: None PROCEDURE: Informed written consent was obtained from the patient after a thorough discussion of the procedural risks, benefits and alternatives. All questions were addressed. Maximal Sterile Barrier Technique was utilized including caps, mask, sterile gowns, sterile gloves, sterile drape, hand hygiene and skin antiseptic. A timeout was performed prior to the initiation of the procedure. Patient was placed in the prone position on the fluoroscopy table. Ultrasound survey was performed with images stored and sent to PACs. The bilateral flank region prepped with Betadine, draped in usual sterile fashion, infiltrated locally with 1% lidocaine. Under real-time ultrasound guidance, a 21-gauge trocar needle was advanced into a posterior lower pole calyx of the left collecting system. Ultrasound image documentation was saved. Urine spontaneously returned through the needle. An 018 micro wire was then guided into the collecting system. A small incision was made with 11 blade scalpel, and the needle was exchanged over a guidewire  for transitional Envy system. The wire and the inner cannula were removed. Contrast injection confirmed appropriate positioning. The Accustick catheter was then exchanged over a guidewire for an 10 French pigtail drain, formed centrally within the left renal collecting system. Contrast injection confirms appropriate positioning and patency. The drain was then secured externally with 0 Prolene suture and placed to external drain bag. The right flank region was then infiltrated locally  with 1% lidocaine. Under real-time ultrasound guidance, a 21-gauge trocar needle was advanced into a posterior lower pole calyx of the right collecting system. Ultrasound image documentation was saved. Urine spontaneously returned through the needle. An 018 micro wire was then guided into the collecting system. A small incision was made with 11 blade scalpel, and the needle was exchanged over a guidewire for transitional Envy system. The wire and the inner cannula were removed. Contrast injection confirmed appropriate positioning. The 4 French catheter was then exchanged over a guidewire for an 10 French pigtail drain, formed centrally within the right renal collecting system. Contrast injection confirms appropriate positioning and patency. The drain was then secured externally with 0 Prolene suture and placed to external drain bag. Patient tolerated the procedure well and remained hemodynamically stable throughout. No complications were encountered and no significant blood loss was encountered. EBL equals 0 IMPRESSION: Status post image guided placement of bilateral percutaneous nephrostomy. Signed, Dulcy Fanny. Dellia Nims, RPVI [facility] Radiology Electronically Signed   By: Corrie Mckusick D.O.   On: 12/27/2019 16:45   IR NEPHROSTOMY PLACEMENT RIGHT  Final Result    IR NEPHROSTOMY PLACEMENT LEFT  Final Result    US RENAL  Final Result    DG C-Arm 1-60 Min-No Report  Final  Result    CT Renal Stone Study  Final Result  Addendum 1 of 1  ADDENDUM REPORT: 12/24/2019 12:35    ADDENDUM:  The possibility of a leak in the posterior urinary bladder in this  patient thought to have a fistula was again discussed with the  provider. Additionally, the presence of marked bilateral  hydronephrosis, worse on the RIGHT also was discussed. Perhaps a  cystogram prior to stented exchange if will not significantly delay  care could be helpful for further management as clinically  warranted. This would avoid the confounding effect of contrast which  could be introduced during stent exchange. Urologic consultation was  suggested.    These results were called by telephone at the time of interpretation  on 12/24/2019 at 12:34 pm to provider [facility] , who verbally  acknowledged these results.      Electronically Signed    By: Zetta Bills M.D.    On: 12/24/2019 12:35      Final      Scheduled Meds:  Chlorhexidine Gluconate Cloth  6 each Topical Daily   feeding supplement (ENSURE ENLIVE)  237 mL Oral TID BM   fluconazole  200 mg Oral Daily   multivitamin with minerals  1 tablet Oral Daily   nicotine  14 mg Transdermal Daily   nutrition supplement (JUVEN)  1 packet Oral BID BM   sevelamer carbonate  800 mg Oral TID WC   sodium bicarbonate  1,300 mg Oral BID   sodium chloride flush  5 mL Intracatheter Q8H   tamsulosin  0.4 mg Oral Daily   PRN Meds: acetaminophen **OR** acetaminophen, HYDROcodone-acetaminophen, hydrOXYzine Continuous Infusions:  sodium chloride 125 mL/hr at 12/29/19 0811      LOS: 5 days  Time spent: Greater than 50% of the 35 minute visit was spent in counseling/coordination of care for the patient as laid out in the A&P.   Dwyane Dee, MD [facility] 12/29/2019, 3:59 PM  Contact via secure chat.  To contact the attending provider between 7A-7P or the covering provider during after hours 7P-7A, please log into the  web site [email or web address] and access using universal [place] password for that  web site. If you do not have the password, please call the hospital operator.

## 2019-12-30 ENCOUNTER — Inpatient Hospital Stay (HOSPITAL_COMMUNITY): Payer: No Typology Code available for payment source

## 2019-12-30 DIAGNOSIS — N321 Vesicointestinal fistula: Secondary | ICD-10-CM

## 2019-12-30 DIAGNOSIS — N135 Crossing vessel and stricture of ureter without hydronephrosis: Secondary | ICD-10-CM

## 2019-12-30 DIAGNOSIS — R531 Weakness: Secondary | ICD-10-CM

## 2019-12-30 DIAGNOSIS — K651 Peritoneal abscess: Secondary | ICD-10-CM

## 2019-12-30 HISTORY — DX: Crossing vessel and stricture of ureter without hydronephrosis: N13.5

## 2019-12-30 LAB — TYPE AND SCREEN
ABO/RH(D): O POS
Antibody Screen: NEGATIVE
Unit division: 0
Unit division: 0
Unit division: 0

## 2019-12-30 LAB — CBC WITH DIFFERENTIAL/PLATELET
Abs Immature Granulocytes: 0.06 10*3/uL (ref 0.00–0.07)
Basophils Absolute: 0 10*3/uL (ref 0.0–0.1)
Basophils Relative: 1 %
Eosinophils Absolute: 0.5 10*3/uL (ref 0.0–0.5)
Eosinophils Relative: 8 %
HCT: 27.5 % — ABNORMAL LOW (ref 39.0–52.0)
Hemoglobin: 8.7 g/dL — ABNORMAL LOW (ref 13.0–17.0)
Immature Granulocytes: 1 %
Lymphocytes Relative: 21 %
Lymphs Abs: 1.4 10*3/uL (ref 0.7–4.0)
MCH: 30.2 pg (ref 26.0–34.0)
MCHC: 31.6 g/dL (ref 30.0–36.0)
MCV: 95.5 fL (ref 80.0–100.0)
Monocytes Absolute: 0.5 10*3/uL (ref 0.1–1.0)
Monocytes Relative: 7 %
Neutro Abs: 4.2 10*3/uL (ref 1.7–7.7)
Neutrophils Relative %: 62 %
Platelets: 208 10*3/uL (ref 150–400)
RBC: 2.88 MIL/uL — ABNORMAL LOW (ref 4.22–5.81)
RDW: 18.1 % — ABNORMAL HIGH (ref 11.5–15.5)
WBC: 6.7 10*3/uL (ref 4.0–10.5)
nRBC: 0 % (ref 0.0–0.2)

## 2019-12-30 LAB — RENAL FUNCTION PANEL
Albumin: 2 g/dL — ABNORMAL LOW (ref 3.5–5.0)
Anion gap: 14 (ref 5–15)
BUN: 76 mg/dL — ABNORMAL HIGH (ref 6–20)
CO2: 18 mmol/L — ABNORMAL LOW (ref 22–32)
Calcium: 6.8 mg/dL — ABNORMAL LOW (ref 8.9–10.3)
Chloride: 110 mmol/L (ref 98–111)
Creatinine, Ser: 7.77 mg/dL — ABNORMAL HIGH (ref 0.61–1.24)
GFR calc Af Amer: 8 mL/min — ABNORMAL LOW (ref >60)
GFR calc non Af Amer: 7 mL/min — ABNORMAL LOW (ref >60)
Glucose, Bld: 120 mg/dL — ABNORMAL HIGH (ref 70–99)
Phosphorus: 7.7 mg/dL — ABNORMAL HIGH (ref 2.5–4.6)
Potassium: 3.3 mmol/L — ABNORMAL LOW (ref 3.5–5.1)
Sodium: 142 mmol/L (ref 135–145)

## 2019-12-30 LAB — BPAM RBC
Blood Product Expiration Date: 202110262359
Blood Product Expiration Date: 202110262359
Blood Product Expiration Date: 202110262359
ISSUE DATE / TIME: 202109261357
ISSUE DATE / TIME: 202109262130
Unit Type and Rh: 5100
Unit Type and Rh: 5100
Unit Type and Rh: 5100

## 2019-12-30 LAB — MAGNESIUM: Magnesium: 2.1 mg/dL (ref 1.7–2.4)

## 2019-12-30 MED ORDER — DARBEPOETIN ALFA 40 MCG/0.4ML IJ SOSY
40.0000 ug | PREFILLED_SYRINGE | Freq: Once | INTRAMUSCULAR | Status: AC
  Administered 2019-12-30: 40 ug via SUBCUTANEOUS
  Filled 2019-12-30: qty 0.4

## 2019-12-30 MED ORDER — ACETAMINOPHEN 500 MG PO TABS: 500.0000 mg | ORAL_TABLET | Freq: Four times a day (QID) | ORAL | Status: DC | PRN

## 2019-12-30 MED ORDER — POLYETHYLENE GLYCOL 3350 17 G PO PACK
17.0000 g | PACK | Freq: Two times a day (BID) | ORAL | Status: DC
  Administered 2019-12-30: 17 g via ORAL
  Filled 2019-12-30: qty 1

## 2019-12-30 MED ORDER — NEPRO/CARBSTEADY PO LIQD
237.0000 mL | Freq: Three times a day (TID) | ORAL | Status: DC
  Administered 2019-12-30: 237 mL via ORAL
  Filled 2019-12-30 (×6): qty 237

## 2019-12-30 MED ORDER — IOHEXOL 9 MG/ML PO SOLN
ORAL | Status: AC
  Filled 2019-12-30: qty 1000

## 2019-12-30 MED ORDER — POTASSIUM CHLORIDE CRYS ER 20 MEQ PO TBCR
30.0000 meq | EXTENDED_RELEASE_TABLET | Freq: Once | ORAL | Status: AC
  Administered 2019-12-30: 30 meq via ORAL
  Filled 2019-12-30: qty 1

## 2019-12-30 MED ORDER — ACETAMINOPHEN 325 MG PO TABS: 325.0000 mg | ORAL_TABLET | Freq: Four times a day (QID) | ORAL | Status: DC | PRN

## 2019-12-30 MED ORDER — IOHEXOL 9 MG/ML PO SOLN
500.0000 mL | ORAL | Status: AC
  Administered 2019-12-30: 500 mL via ORAL

## 2019-12-30 MED ORDER — CALCIUM GLUCONATE-NACL 1-0.675 GM/50ML-% IV SOLN
1.0000 g | Freq: Once | INTRAVENOUS | Status: AC
  Administered 2019-12-30: 1000 mg via INTRAVENOUS
  Filled 2019-12-30: qty 50

## 2019-12-30 NOTE — Plan of Care (Signed)

## 2019-12-30 NOTE — Progress Notes (Addendum)
Chase [name] [id number] 1965/07/03  CARE TEAM:  PCP: [facility], [place] Team: Patient Care Team: [facility], [name] as PCP - General Raynelle Bring, MD as Consulting Physician (Urology)  Inpatient Treatment Team: Treatment Team: Attending Provider: Dwyane Dee, MD; Consulting Physician: Roney Jaffe, MD; Physician Assistant: Alric Seton, PA-C; Rounding Team: Joycelyn Das, MD; Consulting Physician: Raynelle Bring, MD; Rounding Team: D[facility] Cooler Radiology, MD; Consulting Physician: Loistine Chance, MD; Technician: Alma Friendly, [place]; Physical Therapy Assistant: Diego Cory, PTA; Registered Nurse: Margarette Canada, RN; Consulting Physician: Michael Boston, MD   Problem List:   Active Problems:   AKI (acute kidney injury) ([place])   5 Days Post-Op  12/25/2019  Procedure(s): CYSTOSCOPY WITH bilateral  RETROGRADE PYELOGRAM/ leftURETERAL STENT PLACEMENT    Assessment  Acute on severe chronic kidney disease related to hydronephrosis and ureteral obstruction most likely due to 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  [facility] Stay = 6 days)  Plan:  At some point, the patient would benefit from resection the rectosigmoid colon and bladder repair of the colovesical fistula.  The tentative plan was to wait for infection to go down, renal function to improve, and rehabilitation from his stroke.  With his readmission, surgery has been set back.  However he already looks more bright and alert and hemiparesis seems to be markedly resolved since last months admission (despite the worsening ARF/CKD).  I would be inclined to keep him on antibiotics until surgery.  I am skeptical that fluconazole will be enough since he had bacteria on his specimen last month.  Urine culture is growing out Candida albicans.  However no evidence of septic shock.  Low threshold to switch to Eraxis, especially if  sensitivities to fluconazole poor.  Defer to primary service if they wish to involve infectious disease again more formally.  When he was admitted this time, the admitting CT scan noted a probable 6cm pelvic fluid collection/abscess.  He had an abscess in August that was percutaneously drained.  That grew out multiple organisms somewhat drug-resistant but sensitive to Zosyn.  The drain is out now.  It would be very important that all potential pockets of pus are drained in the hopes that the infection can get under better control, inflammatory ureteral obstruction could be abated, surgery would be safer, and minimize need for temporary or permanent colostomy.  Therefore, repeat CAT scan to see if there is any undrained fluid collections and consider percutaneous drainage of this hospitalization.  We will do oral contrast only.  I discussed with Dr. Alinda Money with urology who wishes to check positioning of the stents anyway.  Patient needs a colonoscopy at some point.  Even a flexible sigmoidoscopy would be helpful.  If there are no surprises on his CAT scan, consult gastroenterology for endoscopy and biopsies this admission since no evidence of perf.  Make sure this is not a malignancy.  That could alter treatment plan.  If it is diverticular etiology as we assume, then hopefully would plan robotic sigmoid colectomy with repair of bladder for colovesical fistula.  Ideally would wait 6 weeks after resolution of any abscess.  Low threshold for him to have a Hartmann resection or diverting colostomy if he worsens / fails to improve this admission with another operation in the future.  Another possibility would be immediate anastomosis with diverting ileostomy.  That will have to be determined on the day of surgery.  Patient's biggest concern is losing his rehab/SNF  spot at [facility] since he has enjoyed working with rehab through them & recovering from his stroke & ARF/CKD with severe deconditioning.  He feels  like he is made a lot of strides with PT etc, and he is worried about losing his spot sponsored by the [place] contract due to this unexpected readmission.  Would be helpful for case management and social work to help work with him - they are following closely.  I do agree that rehab is essential for minimal morbidity/mortality.  VTE prophylaxis- SCDs, etc  Mobilize as tolerated to help recovery  55 minutes spent in review, evaluation, examination, counseling, and coordination of care.  I reviewed the CT scan and all his most recent films with Dr Dema Severin, my colorectal partner.  Discussed with Dr. Alinda Money with use more than 50% of that time was spent in counseling.  12/30/2019    Subjective: (Chief complaint)  Patient had no-show in clinic.  Investigation noted he had been re-admitted.  Events noted with attempted cystoscopic and ultimately percutaneous drainage of ureter/renal hilum  Patient tolerating a solid diet and has flatus.  Denies worsening abdominal pain.  Starting to get more hungry.  Walking and getting up.  His biggest concern is losing his rehab spot because of recovering after his severe stroke.  Objective:  Vital signs:  Vitals:   12/29/19 0551 12/29/19 1408 12/29/19 2014 12/30/19 0456  BP: 119/60 123/70 (!) 120/57 108/68  Pulse: (!) 41 (!) 57 (!) 53 (!) 41  Resp: 16 20 18 16   Temp: 98.5 F (36.9 C) 98.2 F (36.8 C) 98.3 F (36.8 C) 98.2 F (36.8 C)  TempSrc:  Oral  Oral  SpO2: (!) 87% 100% 100% 98%  Weight:    59.9 kg  Height:        Last BM Date: 12/28/19  Intake/Output   Yesterday:  09/29 0701 - 09/30 0700 In: 2689.7 [I.V.:2579.7; IV Piggyback:100] Out: 2400 [Urine:2400] This shift:  Total I/O In: -  Out: 450 [Urine:450]  Bowel function:  Flatus: YES  BM:  YES  Drain: (No drain)   Physical Exam:  General: Pt awake/alert in no acute distress.  Looks cachectic but a little better.  Markedly more animated and alert interactive. Eyes: PERRL,  normal EOM.  Sclera clear.  No icterus Neuro: CN II-XII intact w/o focal sensory/motor deficits.  Moving upper extremities regularly easily with good handgrip strength Lymph: No head/neck/groin lymphadenopathy Psych:  No delerium/psychosis/paranoia.  Oriented x 4 HENT: Normocephalic, Mucus membranes moist.  No thrush Neck: Supple, No tracheal deviation.  No obvious thyromegaly Chest: No pain to chest wall compression.  Good respiratory excursion.  No audible wheezing CV:  Pulses intact.  Regular rhythm.  No major extremity edema MS: Normal AROM mjr joints.  No obvious deformity  Abdomen: Soft.  Nondistended.  Nontender.  No evidence of peritonitis.  No incarcerated hernias.  Percutaneous nephrostomy tubes in place.  Right clear yellow.  Left serosanguineous. Foley in place  Ext:  No deformity.  No mjr edema.  No cyanosis Skin: No petechiae / purpurea.  No major sores.  Warm and dry    Results:   Cultures: Recent Results (from the past 720 hour(s))  Urine culture     Status: Abnormal   Collection Time: 12/24/19 10:33 AM   Specimen: Urine, Clean Catch  Result Value Ref Range Status   Specimen Description   Final    URINE, CLEAN CATCH Performed at [facility], [address] [place], [place]  [address]    Special Requests   Final    NONE Performed at [facility], [place] [address], [place], [place] [address]    Culture 70,000 COLONIES/mL YEAST (A)  Final   Report Status 12/26/2019 FINAL  Final  Respiratory Panel by RT PCR (Flu A&B, Covid) - Nasopharyngeal Swab     Status: None   Collection Time: 12/24/19  2:22 PM   Specimen: Nasopharyngeal Swab  Result Value Ref Range Status   SARS Coronavirus 2 by RT PCR NEGATIVE NEGATIVE Final    Comment: (NOTE) SARS-CoV-2 target nucleic acids are NOT DETECTED.  The SARS-CoV-2 RNA is generally detectable in upper respiratoy specimens during the acute phase of infection. The lowest concentration of  SARS-CoV-2 viral copies this assay can detect is 131 copies/mL. A negative result does not preclude SARS-Cov-2 infection and should not be used as the sole basis for treatment or other patient management decisions. A negative result may occur with  improper specimen collection/handling, submission of specimen other than nasopharyngeal swab, presence of viral mutation(s) within the areas targeted by this assay, and inadequate number of viral copies (<131 copies/mL). A negative result must be combined with clinical observations, patient history, and epidemiological information. The expected result is Negative.  Fact Sheet for Patients:  PinkCheek.be  Fact Sheet for Healthcare Providers:  GravelBags.it  This test is no t yet approved or cleared by the [place] FDA and  has been authorized for detection and/or diagnosis of SARS-CoV-2 by FDA under an Emergency Use Authorization (EUA). This EUA will remain  in effect (meaning this test can be used) for the duration of the COVID-19 declaration under Section 564(b)(1) of the Act, 21 U.S.C. section 360bbb-3(b)(1), unless the authorization is terminated or revoked sooner.     Influenza A by PCR NEGATIVE NEGATIVE Final   Influenza B by PCR NEGATIVE NEGATIVE Final    Comment: (NOTE) The Xpert Xpress SARS-CoV-2/FLU/RSV assay is intended as an aid in  the diagnosis of influenza from Nasopharyngeal swab specimens and  should not be used as a sole basis for treatment. Nasal washings and  aspirates are unacceptable for Xpert Xpress SARS-CoV-2/FLU/RSV  testing.  Fact Sheet for Patients: PinkCheek.be  Fact Sheet for Healthcare Providers: GravelBags.it  This test is not yet approved or cleared by the [place] FDA and  has been authorized for detection and/or diagnosis of SARS-CoV-2 by  FDA under an Emergency Use  Authorization (EUA). This EUA will remain  in effect (meaning this test can be used) for the duration of the  Covid-19 declaration under Section 564(b)(1) of the Act, 21  U.S.C. section 360bbb-3(b)(1), unless the authorization is  terminated or revoked. Performed at [facility], [place] [address], [place], [place] [address]   Aerobic/Anaerobic Culture (surgical/deep wound)     Status: None (Preliminary result)   Collection Time: 12/27/19  4:26 PM   Specimen: Abscess  Result Value Ref Range Status   Specimen Description   Final    ABSCESS LEFT KIDNEY Performed at [place] [address], [place], [place] [address]    Special Requests   Final    NONE Performed at [facility], [place] [address], [place], [place] [address]    Gram Stain   Final    MODERATE WBC PRESENT, PREDOMINANTLY MONONUCLEAR FEW YEAST Performed at [facility], [place] [address], [place], [place] [address]    Culture   Final    FEW CANDIDA ALBICANS  NO ANAEROBES ISOLATED; CULTURE IN PROGRESS FOR 5 DAYS    Report Status PENDING  Incomplete  Aerobic/Anaerobic Culture (surgical/deep wound)     Status: None (Preliminary result)   Collection Time: 12/27/19  4:36 PM   Specimen: Abscess  Result Value Ref Range Status   Specimen Description   Final    ABSCESS RIGHT KIDNEY Performed at [place] [address], [place], [place] [address]    Special Requests   Final    NONE Performed at [facility], [place] [address] [place], [place] [address]    Gram Stain   Final    RARE WBC PRESENT,BOTH PMN AND MONONUCLEAR NO ORGANISMS SEEN    Culture   Final    FEW YEAST IDENTIFICATION TO FOLLOW Performed at [facility], [place] [address], [place], [place] [address]    Report Status PENDING  Incomplete    Labs: Results for orders placed or performed during the hospital encounter of 12/24/19 (from the past 48  hour(s))  Basic metabolic panel     Status: Abnormal   Collection Time: 12/29/19  4:12 AM  Result Value Ref Range   Sodium 143 135 - 145 mmol/L   Potassium 3.8 3.5 - 5.1 mmol/L   Chloride 107 98 - 111 mmol/L   CO2 20 (L) 22 - 32 mmol/L   Glucose, Bld 96 70 - 99 mg/dL    Comment: Glucose reference range applies only to samples taken after fasting for at least 8 hours.   BUN 89 (H) 6 - 20 mg/dL   Creatinine, Ser 7.98 (H) 0.61 - 1.24 mg/dL   Calcium 6.8 (L) 8.9 - 10.3 mg/dL   GFR calc non Af Amer 7 (L) >60 mL/min   GFR calc Af Amer 8 (L) >60 mL/min   Anion gap 16 (H) 5 - 15    Comment: Performed at [facility], [place] [address] [place], [place] [address]  Phosphorus     Status: Abnormal   Collection Time: 12/29/19  4:12 AM  Result Value Ref Range   Phosphorus 9.0 (H) 2.5 - 4.6 mg/dL    Comment: Performed at [facility], [place] [address] [place], [place] [address]  CBC with Differential/Platelet     Status: Abnormal   Collection Time: 12/29/19  4:12 AM  Result Value Ref Range   WBC 9.0 4.0 - 10.5 K/uL   RBC 2.92 (L) 4.22 - 5.81 MIL/uL   Hemoglobin 9.0 (L) 13.0 - 17.0 g/dL   HCT 27.0 (L) 39 - 52 %   MCV 92.5 80.0 - 100.0 fL   MCH 30.8 26.0 - 34.0 pg   MCHC 33.3 30.0 - 36.0 g/dL   RDW 17.6 (H) 11.5 - 15.5 %   Platelets 206 150 - 400 K/uL   nRBC 0.0 0.0 - 0.2 %   Neutrophils Relative % 64 %   Neutro Abs 5.8 1.7 - 7.7 K/uL   Lymphocytes Relative 16 %   Lymphs Abs 1.5 0.7 - 4.0 K/uL   Monocytes Relative 11 %   Monocytes Absolute 0.9 0 - 1 K/uL   Eosinophils Relative 7 %   Eosinophils Absolute 0.7 (H) 0 - 0 K/uL   Basophils Relative 1 %   Basophils Absolute 0.1 0 - 0 K/uL   Immature Granulocytes 1 %   Abs Immature Granulocytes 0.06 0.00 - 0.07 K/uL    Comment: Performed at [facility], [place] [address], [place], [place] [address]  Magnesium     Status: None   Collection Time: 12/29/19  4:12 AM  Result Value Ref Range    Magnesium 2.2 1.7 - 2.4 mg/dL    Comment: Performed at [facility], [place] [address] [place], [place] [address]  CBC with Differential/Platelet     Status: Abnormal   Collection Time: 12/30/19  6:03 AM  Result Value Ref Range   WBC 6.7 4.0 - 10.5 K/uL   RBC 2.88 (L) 4.22 - 5.81 MIL/uL   Hemoglobin 8.7 (L) 13.0 - 17.0 g/dL   HCT 27.5 (L) 39 - 52 %   MCV 95.5 80.0 - 100.0 fL   MCH 30.2 26.0 - 34.0 pg   MCHC 31.6 30.0 - 36.0 g/dL   RDW 18.1 (H) 11.5 - 15.5 %   Platelets 208 150 - 400 K/uL   nRBC 0.0 0.0 - 0.2 %   Neutrophils Relative % 62 %   Neutro Abs 4.2 1.7 - 7.7 K/uL   Lymphocytes Relative 21 %   Lymphs Abs 1.4 0.7 - 4.0 K/uL   Monocytes Relative 7 %   Monocytes Absolute 0.5 0 - 1 K/uL   Eosinophils Relative 8 %   Eosinophils Absolute 0.5 0 - 0 K/uL   Basophils Relative 1 %   Basophils Absolute 0.0 0 - 0 K/uL   Immature Granulocytes 1 %   Abs Immature Granulocytes 0.06 0.00 - 0.07 K/uL    Comment: Performed at [facility], [place] [address], [place], [place] [address]  Magnesium     Status: None   Collection Time: 12/30/19  6:03 AM  Result Value Ref Range   Magnesium 2.1 1.7 - 2.4 mg/dL    Comment: Performed at [facility], [place] [address] [place], [place] [address]  Renal function panel     Status: Abnormal   Collection Time: 12/30/19  6:03 AM  Result Value Ref Range   Sodium 142 135 - 145 mmol/L   Potassium 3.3 (L) 3.5 - 5.1 mmol/L   Chloride 110 98 - 111 mmol/L   CO2 18 (L) 22 - 32 mmol/L   Glucose, Bld 120 (H) 70 - 99 mg/dL    Comment: Glucose reference range applies only to samples taken after fasting for at least 8 hours.   BUN 76 (H) 6 - 20 mg/dL   Creatinine, Ser 7.77 (H) 0.61 - 1.24 mg/dL   Calcium 6.8 (L) 8.9 - 10.3 mg/dL   Phosphorus 7.7 (H) 2.5 - 4.6 mg/dL   Albumin 2.0 (L) 3.5 - 5.0 g/dL   GFR calc non Af Amer 7 (L) >60 mL/min   GFR calc Af Amer 8 (L) >60 mL/min   Anion gap 14 5 - 15    Comment:  Performed at [facility], [place] [address], [place], [place] [address]    Imaging / Studies: No results found.  Medications / Allergies: per chart  Antibiotics: Anti-infectives (From admission, onward)   Start     Dose/Rate Route Frequency Ordered Stop   12/27/19 1541  ciprofloxacin (CIPRO) 400 MG/200ML IVPB       Note to Pharmacy: Hilma Favors   : cabinet override      12/27/19 1541 12/28/19 0741   12/27/19 1333  fluconazole (DIFLUCAN) tablet 200 mg        200 mg Oral Daily 12/27/19 1333 01/01/20 0959   12/27/19 1300  fluconazole (DIFLUCAN) tablet 400 mg  Status:  Discontinued  400 mg Oral Daily 12/27/19 1213 12/27/19 1214   12/27/19 1300  fluconazole (DIFLUCAN) tablet 200 mg  Status:  Discontinued        200 mg Oral Daily 12/27/19 1214 12/27/19 1333   12/25/19 1600  piperacillin-tazobactam (ZOSYN) IVPB 2.25 g  Status:  Discontinued        2.25 g 100 mL/hr over 30 Minutes Intravenous Every 8 hours 12/25/19 1226 12/27/19 1213   12/25/19 1132  ceFAZolin (ANCEF) 2-4 GM/100ML-% IVPB       Note to Pharmacy: [place]  : cabinet override      12/25/19 1132 12/25/19 2344        Note: Portions of this report may have been transcribed using voice recognition software. Every effort was made to ensure accuracy; however, inadvertent computerized transcription errors may be present.   Any transcriptional errors that result from this process are unintentional.    Adin Hector, MD, FACS, MASCRS Gastrointestinal and Minimally Invasive Surgery  [facility] [address] [place], [place] [address] [phone] Fax [phone] Main/Paging  CONTACT INFORMATION: Weekday (9AM-5PM) concerns: Call [facility] main office at [phone] Weeknight (5PM-9AM) or Weekend/Holiday concerns: Check [email or web address] for General Surgery [facility] coverage (Please, do not use SecureChat as it is not reliable communication to operating surgeons for  immediate patient care)      12/30/2019  8:52 AM

## 2019-12-30 NOTE — Plan of Care (Signed)
  Problem: Activity: Goal: Risk for activity intolerance will decrease Outcome: Progressing   Problem: Nutrition: Goal: Adequate nutrition will be maintained Outcome: Progressing   Problem: Coping: Goal: Level of anxiety will decrease Outcome: Progressing   Problem: Education: Goal: Knowledge of General Education information will improve Description: Including pain rating scale, medication(s)/side effects and non-pharmacologic comfort measures Outcome: Completed/Met   

## 2019-12-30 NOTE — Progress Notes (Signed)
Note: Portions of this report may have been transcribed using voice recognition software. A sincere effort was made to ensure accuracy; however, inadvertent computerized transcription errors may be present.   Any transcriptional errors that result from this process are unintentional.        Chase [name]  1966-01-19 [id number]  Patient Care Team: [name] as PCP - General Raynelle Bring, MD as Consulting Physician (Urology)  Repeat CT scan shows persistent fluid collection between bladder and rectosigmoid colon concerning for persistent abscess.  Larger.  I think he would benefit from percutaneous drainage to get this under better control.  Send for culture to make sure he does not have an atypical resistant organisms.  May need a transgluteal approach.  See what radiology feels.  I would keep this drain until he gets surgery and not remove this.    Consider restarting Zosyn.  Most likely can wait until tomorrow after cultures are done  Patient Active Problem List   Diagnosis Date Noted  . 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 Protein-calorie malnutrition, severe 10/23/2019    Priority: Medium  . CKD (chronic kidney disease), stage IV ([place]) 10/17/2019    Priority: Medium  . AKI (acute kidney injury) ([place]) 12/24/2019  . Pelvic abscess with colovescial fistula 11/15/2019  . E. coli UTI (urinary tract infection) 11/15/2019  . PTSD (post-traumatic stress disorder)   . Chemical exposure   . Cervical radiculopathy   . Septic shock ([place]) 11/08/2019  . Cerebral embolism with cerebral infarction 10/18/2019  . Acute left-sided weakness 10/17/2019  . Chronic pain 10/17/2019  . Fall 10/17/2019  . Pseudomonas urinary tract infection 08/24/2019  . BPH (benign prostatic hyperplasia) 08/24/2019  . Acute on chronic renal failure ([place]) 08/24/2019  . Anemia  08/24/2019  . Dyspnea 08/24/2019  . History of COVID-19 08/24/2019  . History of elevated PSA 08/24/2019  . 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 Hyponatremia 08/24/2019  . Metabolic acidosis 47/82/9562  . Skin lesion of neck 08/24/2019  . Toxic metabolic encephalopathy 13/10/6576  . Cervical spondylosis without myelopathy 06/19/2016  . Neck pain 09/04/2015  . History of stomach ulcers 05/19/2013  . Male erectile dysfunction 05/19/2013  . Hodgkin lymphoma, unspecified, unspecified site ([place]) 12/10/2012  . Iron deficiency anemia 12/10/2012  . Pernicious anemia 12/10/2012  . Arthralgia of multiple sites 04/20/2012  . Postlaminectomy syndrome, not elsewhere classified 04/20/2012    Past Medical History:  Diagnosis Date  . Cervical radiculopathy    with left sided weakness  . Chemical exposure    service related injury  . Chronic pain   . GIB (gastrointestinal bleeding)   . PTSD (post-traumatic stress disorder)   . Renal disorder    CKD  . Urinary retention    due to bladder outlet obstruction    Past Surgical History:  Procedure Laterality Date  . CYSTOSCOPY W/ URETERAL STENT PLACEMENT N/A 11/14/2019   Procedure: CYSTOSCOPY WITH RETROGRADE PYELOGRAM bilateral Wyvonnia Dusky STENT PLACEMENT left fulguration bladder . cystogram;  Surgeon: Raynelle Bring, MD;  Location: [facility];  Service: Urology;  Laterality: N/A;  . CYSTOSCOPY W/ URETERAL STENT PLACEMENT Bilateral 12/25/2019   Procedure: CYSTOSCOPY WITH bilateral  RETROGRADE PYELOGRAM/ leftURETERAL STENT PLACEMENT;  Surgeon: Lucas Mallow, MD;  Location: [facility];  Service: Urology;  Laterality: Bilateral;  . GASTRIC BYPASS    . HERNIA REPAIR    .  IR NEPHROSTOMY PLACEMENT LEFT  12/27/2019  . IR NEPHROSTOMY PLACEMENT RIGHT  11/16/2019  . IR NEPHROSTOMY PLACEMENT RIGHT  12/27/2019  . IR SINUS/FIST TUBE CHK-NON GI  12/03/2019  . IR URETERAL STENT PLACEMENT EXISTING ACCESS RIGHT  12/08/2019    Social History   Socioeconomic History   . Marital status: Widowed    Spouse name: Not on file  . Number of children: Not on file  . Years of education: Not on file  . Highest education level: Not on file  Occupational History  . Not on file  Tobacco Use  . Smoking status: Never Smoker  . Smokeless tobacco: Current User  Vaping Use  . Vaping Use: Never used  Substance and Sexual Activity  . Alcohol use: Never  . Drug use: Never  . Sexual activity: Not on file  Other Topics Concern  . Not on file  Social History Narrative  . Not on file   Social Determinants of Health   Financial Resource Strain:   . Difficulty of Paying Living Expenses: Not on file  Food Insecurity:   . Worried About Charity fundraiser in the Last Year: Not on file  . Ran Out of Food in the Last Year: Not on file  Transportation Needs:   . Lack of Transportation (Medical): Not on file  . Lack of Transportation (Non-Medical): Not on file  Physical Activity:   . Days of Exercise per Week: Not on file  . Minutes of Exercise per Session: Not on file  Stress:   . Feeling of Stress : Not on file  Social Connections:   . Frequency of Communication with Friends and Family: Not on file  . Frequency of Social Gatherings with Friends and Family: Not on file  . Attends Religious Services: Not on file  . Active Member of Clubs or Organizations: Not on file  . Attends Archivist Meetings: Not on file  . Marital Status: Not on file  Intimate Partner Violence:   . Fear of Current or Ex-Partner: Not on file  . Emotionally Abused: Not on file  . Physically Abused: Not on file  . Sexually Abused: Not on file    Family History  Problem Relation Age of Onset  . Renal cancer Father   . Heart disease Paternal Grandfather     Current Facility-Administered Medications  Medication Dose Route Frequency Provider Last Rate Last Admin  . 0.9 %  sodium chloride infusion   Intravenous Continuous Claudia Desanctis, MD 125 mL/hr at 12/30/19 1135 New Bag at  12/30/19 1135  . acetaminophen (TYLENOL) tablet 500-1,000 mg  500-1,000 mg Oral Q6H PRN Michael Boston, MD      . calcium gluconate 1 g/ 50 mL sodium chloride IVPB  1 g Intravenous Once Claudia Desanctis, MD      . Chlorhexidine Gluconate Cloth 2 % PADS 6 each  6 each Topical Daily Marton Redwood III, MD   6 each at 12/30/19 1315  . feeding supplement (ENSURE ENLIVE) (ENSURE ENLIVE) liquid 237 mL  237 mL Oral TID BM Kyle, Tyrone A, DO   237 mL at 12/26/19 2109  . feeding supplement (NEPRO CARB STEADY) liquid 237 mL  237 mL Oral TID BM Michael Boston, MD   237 mL at 12/30/19 1116  . fluconazole (DIFLUCAN) tablet 200 mg  200 mg Oral Daily Modena Jansky, MD   200 mg at 12/30/19 0913  . HYDROcodone-acetaminophen (NORCO/VICODIN) 5-325 MG per tablet 1-2 tablet  1-2 tablet Oral Q4H PRN Cherylann Ratel A, DO   2 tablet at 12/30/19 1316  . hydrOXYzine (ATARAX/VISTARIL) tablet 10 mg  10 mg Oral TID PRN Modena Jansky, MD   10 mg at 12/30/19 0926  . iohexol (OMNIPAQUE) 9 MG/ML oral solution           . multivitamin with minerals tablet 1 tablet  1 tablet Oral Daily Kyle, Tyrone A, DO   1 tablet at 12/30/19 0912  . nicotine (NICODERM CQ - dosed in mg/24 hours) patch 14 mg  14 mg Transdermal Daily Kyle, Tyrone A, DO      . nutrition supplement (JUVEN) (JUVEN) powder packet 1 packet  1 packet Oral BID BM Hongalgi, Lenis Dickinson, MD   1 packet at 12/26/19 1051  . polyethylene glycol (MIRALAX / GLYCOLAX) packet 17 g  17 g Oral BID Michael Boston, MD      . potassium chloride (KLOR-CON) CR tablet 30 mEq  30 mEq Oral Once Harrie Jeans C, MD      . sevelamer carbonate (RENVELA) tablet 800 mg  800 mg Oral TID WC Claudia Desanctis, MD   800 mg at 12/30/19 1224  . sodium bicarbonate tablet 1,300 mg  1,300 mg Oral BID Claudia Desanctis, MD   1,300 mg at 12/30/19 0912  . sodium chloride flush (NS) 0.9 % injection 5 mL  5 mL Intracatheter Q8H Wagner, Jaime, DO   5 mL at 12/28/19 1740  . tamsulosin (FLOMAX) capsule 0.4 mg  0.4 mg Oral  Daily Kyle, Tyrone A, DO   0.4 mg at 12/30/19 0912     Allergies  Allergen Reactions  . Nsaids Other (See Comments)    Bleeding GI Ulceration    BP 127/63 (BP Location: Right Arm)   Pulse (!) 44   Temp 98.2 F (36.8 C) (Oral)   Resp 18   Ht 5\' 3"  (1.6 m)   Wt 59.9 kg   SpO2 100%   BMI 23.38 kg/m   CT ABDOMEN PELVIS WO CONTRAST  Result Date: 12/30/2019 CLINICAL DATA:  Abdominal pain. EXAM: CT ABDOMEN AND PELVIS WITHOUT CONTRAST TECHNIQUE: Multidetector CT imaging of the abdomen and pelvis was performed following the standard protocol without IV contrast. COMPARISON:  December 24, 2019. FINDINGS: Lower chest: Small bilateral pleural effusions are noted with adjacent subsegmental atelectasis. Hepatobiliary: No focal liver abnormality is seen. Status post cholecystectomy. No biliary dilatation. Pancreas: Unremarkable. No pancreatic ductal dilatation or surrounding inflammatory changes. Spleen: Normal in size without focal abnormality. Adrenals/Urinary Tract: Adrenal glands appear normal. Interval placement of bilateral percutaneous nephrostomies. Left ureteral stent is in grossly good position. Right ureteral stent noted on prior exam has been removed. No definite hydronephrosis or renal obstruction is seen at this time. Foley catheter is noted within the urinary bladder. Perivesical fluid collection is again noted, which currently measures 6.0 x 4.3 cm in the transverse orientation which may be slightly enlarged compared to prior exam. Fluid within urinary bladder appears to be high density suggesting possible hemorrhage. Stomach/Bowel: Status post gastric bypass. There is no evidence of bowel obstruction or inflammation. The appendix appears normal. Vascular/Lymphatic: Aortic atherosclerosis. No enlarged abdominal or pelvic lymph nodes. Reproductive: Prostate is unremarkable. Other: Mild anasarca is noted.  No definite hernia is noted. Musculoskeletal: No acute or significant osseous findings.  IMPRESSION: 1. Interval placement of bilateral percutaneous nephrostomies. Left ureteral stent is in grossly good position. Right ureteral stent noted on prior exam has been removed. No definite hydronephrosis or  renal obstruction is seen at this time. 2. Perivesical fluid collection is again noted, which currently measures 6.0 x 4.3 cm in the transverse orientation which may be slightly enlarged compared to prior exam. Fluid within urinary bladder appears to be high density suggesting possible hemorrhage. 3. Small bilateral pleural effusions are noted with adjacent subsegmental atelectasis. 4. Mild anasarca. 5. Aortic atherosclerosis. Aortic Atherosclerosis (ICD10-I70.0). Electronically Signed   By: Marijo Conception M.D.   On: 12/30/2019 12:47   CT ABDOMEN PELVIS WO CONTRAST  Result Date: 12/03/2019 CLINICAL DATA:  54 year old male with colovesicular fistula and pelvic abscess formation. Follow-up evaluation following percutaneous drain placement on 11/15/2019 EXAM: CT ABDOMEN AND PELVIS WITHOUT CONTRAST TECHNIQUE: Multidetector CT imaging of the abdomen and pelvis was performed following the standard protocol without IV contrast. COMPARISON:  Prior CT scan of the pelvis 11/14/2019 FINDINGS: Lower chest: Chronic elevation of the right hemidiaphragm. The intracardiac blood pool is hypodense relative to the adjacent myocardium consistent with anemia. Small pericardial effusion. Hepatobiliary: Marked colonic interposition. The gallbladder is surgically absent. No discrete hepatic lesion. No intra or extrahepatic biliary ductal dilatation. Pancreas: Unremarkable. No pancreatic ductal dilatation or surrounding inflammatory changes. Spleen: Normal in size without focal abnormality. Adrenals/Urinary Tract: Unremarkable adrenal glands. Right-sided percutaneous nephrostomy tube in good position. No evidence of right-sided hydronephrosis. On the left, a double-J ureteral stent is present. Minimal fullness of the renal  pelvis without true hydro. A Foley catheter is pleasant in the bladder. Diffuse circumferential bladder wall thickening again noted. Marked prostatomegaly. Stomach/Bowel: No evidence of obstruction or focal bowel wall thickening. Vascular/Lymphatic: Limited evaluation in the absence of intravenous contrast. No suspicious lymphadenopathy. Atherosclerotic calcifications noted along the aorta. Reproductive: Prostatomegaly. Other: Percutaneous drainage catheter noted in the pelvic cul-de-sac. No residual fluid collection. The abscess appears completely resolved. Musculoskeletal: No acute fracture or aggressive appearing lytic or blastic osseous lesion. IMPRESSION: 1. Complete interval resolution of abscess from the pelvic cul-de-sac. 2. The percutaneous drainage catheter remains in good position. 3. Additional ancillary findings as above. Electronically Signed   By: Jacqulynn Cadet M.D.   On: 12/03/2019 18:15   IR Sinus/Fist Tube Chk-Non GI  Result Date: 12/03/2019 INDICATION: 54 year old male with a history of suspected colovesicular fistula and associated pelvic abscess formation. A percutaneous drainage catheter was placed on 11/15/2019. CT imaging obtained today demonstrates resolution of the abscess cavity. He now presents for drain injection to evaluate for fistulous communication between the rectum or bladder. EXAM: SINUS TRACT INJECTION / FISTULOGRAM COMPARISON:  CT scan obtained earlier today MEDICATIONS: None ANESTHESIA/SEDATION: None COMPLICATIONS: None immediate. TECHNIQUE: A gentle hand injection of contrast material was performed. Contrast layers within the pelvic cul-de-sac. There is no evidence of fistulous communication with the nearby rectum or bladder. The injected contrast was aspirated. The catheter was then removed in the standard fashion. PROCEDURE: A gentle hand injection of contrast material was performed. Contrast layers within the pelvic cul-de-sac. There is no evidence of fistulous  communication with the nearby rectum or bladder. The injected contrast was aspirated. The catheter was then removed in the standard fashion. IMPRESSION: 1. Result abscess cavity without evidence of fistula. 2. Removal of percutaneous drainage catheter. Electronically Signed   By: Jacqulynn Cadet M.D.   On: 12/03/2019 18:12   US RENAL  Result Date: 12/25/2019 CLINICAL DATA:  Hydronephrosis. EXAM: RENAL / URINARY TRACT ULTRASOUND COMPLETE COMPARISON:  November 12, 2019 FINDINGS: Right Kidney: Renal measurements: 11.3 x 6.4 x 7.9 cm = volume: 300 mL. Marked hydronephrosis. Increased  echogenicity of the renal cortex. Left Kidney: Renal measurements: 10.8 x 9.7 x 10.2 cm = volume: 559 mL. Marked hydronephrosis. Increased echogenicity of the renal cortex. Echogenic material within the lower pole calices may represent residual contrast or inspissated debris/blood products. Stent artifact is seen in the left renal pelvis. Bladder: Mildly distended around urinary Foley. Again seen is isoechoic avascular material within the dependent portion of the urinary bladder. Other: None. IMPRESSION: 1. Marked bilateral hydronephrosis, worse on the left. 2. Increased cortical echogenicity of the kidneys, consistent with medical renal disease. 3. Left ureteral stent is seen. The right ureteral stent is not seen sonographically. 4. Mildly distended urinary bladder around urinary Foley with isoechoic avascular material within the dependent portion of the urinary bladder, possibly representing debris or organizing blood products. 5. Increased echogenicity within the left lower pole renal calices may represent blood products or inspissated debris. Electronically Signed   By: Fidela Salisbury M.D.   On: 12/25/2019 16:58   DG C-Arm 1-60 Min-No Report  Result Date: 12/25/2019 Fluoroscopy was utilized by the requesting physician.  No radiographic interpretation.   CT Renal Stone Study  Addendum Date: 12/24/2019   ADDENDUM REPORT:  12/24/2019 12:35 ADDENDUM: The possibility of a leak in the posterior urinary bladder in this patient thought to have a fistula was again discussed with the provider. Additionally, the presence of marked bilateral hydronephrosis, worse on the RIGHT also was discussed. Perhaps a cystogram prior to stented exchange if will not significantly delay care could be helpful for further management as clinically warranted. This would avoid the confounding effect of contrast which could be introduced during stent exchange. Urologic consultation was suggested. These results were called by telephone at the time of interpretation on 12/24/2019 at 12:34 pm to provider [facility] , who verbally acknowledged these results. Electronically Signed   By: Zetta Bills M.D.   On: 12/24/2019 12:35   Result Date: 12/24/2019 CLINICAL DATA:  Flank pain, kidney stone suspected EXAM: CT ABDOMEN AND PELVIS WITHOUT CONTRAST TECHNIQUE: Multidetector CT imaging of the abdomen and pelvis was performed following the standard protocol without IV contrast. COMPARISON:  12/03/2019 FINDINGS: Lower chest: Lung bases are clear. No consolidation. No pleural effusion. Hepatobiliary: Liver grossly normal.  Post cholecystectomy. Pancreas: No peripancreatic stranding or ductal dilation. Spleen: Spleen normal in size and contour. Adrenals/Urinary Tract: Adrenal glands are normal. Moderate to marked bilateral collecting system and renal pelvic distension increased when compared to 12/03/2019. Ureteral stents are in place, proximal loops within the area of the renal pelvis bilaterally, distal loops within the urinary bladder. Gas within the urinary bladder and thickened wall. Perivesical fluid collection with reaccumulation of some fluid following drain removal within the rectoprostatic region measuring 6.6 x 2.3 x 6.2 cm. Foley catheter remains in place. Stomach/Bowel: Post gastric bypass procedure. No significant colonic thickening normal appendix.  Vascular/Lymphatic: Aortic atheromatous plaque. No aneurysmal dilation. There is no gastrohepatic or hepatoduodenal ligament lymphadenopathy. No retroperitoneal or mesenteric lymphadenopathy. No pelvic sidewall lymphadenopathy. Reproductive: Fluid collection about the bladder. Prostate not well evaluated. Other: No free air. Postoperative changes associated with prior herniorrhaphy in the LEFT lower quadrant. Musculoskeletal: Signs of prior trauma to the RIGHT and LEFT chest with rib fractures of varying ages. No acute musculoskeletal process or destructive bone finding. IMPRESSION: 1. Reaccumulation of small to moderate volume of fluid above the urinary bladder in this patient with suspected defect in the urinary bladder. No adjacent colonic thickening. Study limited by lack of rectal or vesicle contrast with  respect to detection of colovesical fistula though the fat plane appears reasonably well preserved between the collection in the colon on the current study. 2. Interval development of moderate to marked bilateral hydroureteronephrosis despite presence of bilateral ureteral stents. 3. Aortic atherosclerosis. Call is out to the referring provider to further discuss findings in the above case. Aortic Atherosclerosis (ICD10-I70.0). Electronically Signed: By: Zetta Bills M.D. On: 12/24/2019 12:18   IR NEPHROSTOMY PLACEMENT LEFT  Result Date: 12/27/2019 INDICATION: 54 year old male with obstructed bilateral collecting system EXAM: IR NEPHROSTOMY PLACEMENT RIGHT; IR NEPHROSTOMY PLACEMENT LEFT COMPARISON:  None. MEDICATIONS: 400 mg Cipro; The antibiotic was administered in an appropriate time frame prior to skin puncture. ANESTHESIA/SEDATION: Fentanyl 1.0 mcg IV; Versed 50 mg IV Moderate Sedation Time:  16 minutes The patient was continuously monitored during the procedure by the interventional radiology nurse under my direct supervision. CONTRAST:  58mL OMNIPAQUE IOHEXOL 300 MG/ML SOLN - administered into the  collecting system(s) FLUOROSCOPY TIME:  Fluoroscopy Time: 2 minutes 0 seconds (60 mGy). COMPLICATIONS: None PROCEDURE: Informed written consent was obtained from the patient after a thorough discussion of the procedural risks, benefits and alternatives. All questions were addressed. Maximal Sterile Barrier Technique was utilized including caps, mask, sterile gowns, sterile gloves, sterile drape, hand hygiene and skin antiseptic. A timeout was performed prior to the initiation of the procedure. Patient was placed in the prone position on the fluoroscopy table. Ultrasound survey was performed with images stored and sent to PACs. The bilateral flank region prepped with Betadine, draped in usual sterile fashion, infiltrated locally with 1% lidocaine. Under real-time ultrasound guidance, a 21-gauge trocar needle was advanced into a posterior lower pole calyx of the left collecting system. Ultrasound image documentation was saved. Urine spontaneously returned through the needle. An 018 micro wire was then guided into the collecting system. A small incision was made with 11 blade scalpel, and the needle was exchanged over a guidewire for transitional Envy system. The wire and the inner cannula were removed. Contrast injection confirmed appropriate positioning. The Acc[place]ick catheter was then exchanged over a guidewire for an 10 French pigtail drain, formed centrally within the left renal collecting system. Contrast injection confirms appropriate positioning and patency. The drain was then secured externally with 0 Prolene suture and placed to external drain bag. The right flank region was then infiltrated locally with 1% lidocaine. Under real-time ultrasound guidance, a 21-gauge trocar needle was advanced into a posterior lower pole calyx of the right collecting system. Ultrasound image documentation was saved. Urine spontaneously returned through the needle. An 018 micro wire was then guided into the collecting system. A  small incision was made with 11 blade scalpel, and the needle was exchanged over a guidewire for transitional Envy system. The wire and the inner cannula were removed. Contrast injection confirmed appropriate positioning. The 4 French catheter was then exchanged over a guidewire for an 10 French pigtail drain, formed centrally within the right renal collecting system. Contrast injection confirms appropriate positioning and patency. The drain was then secured externally with 0 Prolene suture and placed to external drain bag. Patient tolerated the procedure well and remained hemodynamically stable throughout. No complications were encountered and no significant blood loss was encountered. EBL equals 0 IMPRESSION: Status post image guided placement of bilateral percutaneous nephrostomy. Signed, Dulcy Fanny. Dellia Nims, RPVI [facility] Electronically Signed   By: Corrie Mckusick D.O.   On: 12/27/2019 16:45   IR NEPHROSTOMY PLACEMENT RIGHT  Result Date:  12/27/2019 INDICATION: 54 year old male with obstructed bilateral collecting system EXAM: IR NEPHROSTOMY PLACEMENT RIGHT; IR NEPHROSTOMY PLACEMENT LEFT COMPARISON:  None. MEDICATIONS: 400 mg Cipro; The antibiotic was administered in an appropriate time frame prior to skin puncture. ANESTHESIA/SEDATION: Fentanyl 1.0 mcg IV; Versed 50 mg IV Moderate Sedation Time:  16 minutes The patient was continuously monitored during the procedure by the interventional radiology nurse under my direct supervision. CONTRAST:  83mL OMNIPAQUE IOHEXOL 300 MG/ML SOLN - administered into the collecting system(s) FLUOROSCOPY TIME:  Fluoroscopy Time: 2 minutes 0 seconds (60 mGy). COMPLICATIONS: None PROCEDURE: Informed written consent was obtained from the patient after a thorough discussion of the procedural risks, benefits and alternatives. All questions were addressed. Maximal Sterile Barrier Technique was utilized including caps,  mask, sterile gowns, sterile gloves, sterile drape, hand hygiene and skin antiseptic. A timeout was performed prior to the initiation of the procedure. Patient was placed in the prone position on the fluoroscopy table. Ultrasound survey was performed with images stored and sent to PACs. The bilateral flank region prepped with Betadine, draped in usual sterile fashion, infiltrated locally with 1% lidocaine. Under real-time ultrasound guidance, a 21-gauge trocar needle was advanced into a posterior lower pole calyx of the left collecting system. Ultrasound image documentation was saved. Urine spontaneously returned through the needle. An 018 micro wire was then guided into the collecting system. A small incision was made with 11 blade scalpel, and the needle was exchanged over a guidewire for transitional Envy system. The wire and the inner cannula were removed. Contrast injection confirmed appropriate positioning. The Accustick catheter was then exchanged over a guidewire for an 10 French pigtail drain, formed centrally within the left renal collecting system. Contrast injection confirms appropriate positioning and patency. The drain was then secured externally with 0 Prolene suture and placed to external drain bag. The right flank region was then infiltrated locally with 1% lidocaine. Under real-time ultrasound guidance, a 21-gauge trocar needle was advanced into a posterior lower pole calyx of the right collecting system. Ultrasound image documentation was saved. Urine spontaneously returned through the needle. An 018 micro wire was then guided into the collecting system. A small incision was made with 11 blade scalpel, and the needle was exchanged over a guidewire for transitional Envy system. The wire and the inner cannula were removed. Contrast injection confirmed appropriate positioning. The 4 French catheter was then exchanged over a guidewire for an 10 French pigtail drain, formed centrally within the right  renal collecting system. Contrast injection confirms appropriate positioning and patency. The drain was then secured externally with 0 Prolene suture and placed to external drain bag. Patient tolerated the procedure well and remained hemodynamically stable throughout. No complications were encountered and no significant blood loss was encountered. EBL equals 0 IMPRESSION: Status post image guided placement of bilateral percutaneous nephrostomy. Signed, Dulcy Fanny. Dellia Nims, RPVI [facility] Radiology Electronically Signed   By: Corrie Mckusick D.O.   On: 12/27/2019 16:45   IR URETERAL STENT PLACEMENT EXISTING ACCESS RIGHT  Result Date: 12/08/2019 CLINICAL DATA:  Colovesical fistula. Left retrograde ureteral stent. Right nephrostomy catheter. EXAM: RIGHT PERCUTANEOUS NEPHROSTOMY CATHETER EXCHANGE FOR INTERNAL URETERAL STENT UNDER FLUOROSCOPY FLUOROSCOPY TIME:  3 minutes 36 seconds; 15 mGy TECHNIQUE: The procedure, risks (including but not limited to bleeding, infection, organ damage), benefits, and alternatives were explained to the patient. Questions regarding the procedure were encouraged and answered. The patient understands and consents to the procedure. Intravenous dilaudid 0.5 mg and  Versed 1mg  were administered as conscious sedation during continuous monitoring of the patient's level of consciousness and physiological / cardiorespiratory status by the radiology RN, with a total moderate sedation time of 12 minutes. As antibiotic prophylaxis, Cipro 400 mg was ordered pre-procedure and administered intravenously within one hour of incision. The nephrostomy tube and surrounding skin were prepped with Betadine, draped in usual sterile fashion. Maximal barrier sterile technique was utilized including caps, mask, sterile gowns, sterile gloves, sterile drape, hand hygiene and skin antiseptic. Skin surrounding the catheter was infiltrated with 1% lidocaine. A small  amount of contrast was injected through the right nephrostomy catheter to opacify the renal collecting system. The catheter was cut and exchanged over a 0.035" angiographic wire for a 5-French Kumpe catheter, advanced into the urinary bladder with the aid of an angled Glidewire. The Kumpe was exchanged over an Amplatz wire for a 24 cm 8 French double-J ureteral stent, deployed with the distal end in the lumen of the urinary bladder, proximal end formed centrally in the right renal collecting system. Contrast injection confirms appropriate positioning and patency. There is no evidence of clot or other complication. Percutaneous access was therefore removed. The patient tolerated the procedure well, with no immediate complication. IMPRESSION: 1. Technically successful exchange of right nephrostomy catheter for an 8-French 24 cm double-J ureteral stent under fluoroscopy . Electronically Signed   By: Lucrezia Europe M.D.   On: 12/08/2019 16:54

## 2019-12-30 NOTE — Progress Notes (Signed)
[name] Progress Note  Subjective:  Had 2.4 liters UOP over 9/29 charted.  He has been on NS at 125 ml/hr.  He states he's been through a lot.  His spouse passed away a few years ago.  States had MI in March of 2020, had covid sept 2020. Had cancer/Hogkin's lymphoma (in remission since 2011).  He's glad that he's a bit better.  He states he has known about kidney failure since covid and had blood in his urine first noted a couple of years ago.  Note CT scan demonstrates persistent fluid collection between bladder and rectosigmoid colon concerning for persistent abscess per surgery; they recommend percutaneous drainage of same.   He is cancer-free and we discussed the risks/benefits/indications of ESA and he consents to receive aranesp.   Review of systems:    Denies shortness of breath  No chest pain  No n/v   Vitals:   12/29/19 1408 12/29/19 2014 12/30/19 0456 12/30/19 1227  BP: 123/70 (!) 120/57 108/68 127/63  Pulse: (!) 57 (!) 53 (!) 41 (!) 44  Resp: 20 18 16 18   Temp: 98.2 F (36.8 C) 98.3 F (36.8 C) 98.2 F (36.8 C) 98.2 F (36.8 C)  TempSrc: Oral  Oral Oral  SpO2: 100% 100% 98% 100%  Weight:   59.9 kg   Height:        Exam: Gen adult male in bed in NAD; chronically ill appearing  HENT NCAT  Chest clear bilat to bases unlabored S1S2 no rub Abd soft ntnd  GU foley cath in place; neph tubes in place Ext no leg or UE edema Neuro is alert, Ox 3; provides hx and follows commands Psych normal mood and affect    Home meds:  - cymbalta 30-60 hs/ neurontin 800 hs/ norco qid prn  - midodrine 2.5- 5 tid prn  - flomax 0.4 qd  - ensure tid bm/ MVI/ atarax prn      CT renal stone study noncon >  IMPRESSION: 1. Reaccumulation of small to moderate volume of fluid above the urinary bladder in this patient with suspected defect in the urinary bladder. No adjacent colonic thickening. Study limited by lack of rectal or vesicle contrast with respect to  detection of colovesical fistula though the fat plane appears reasonably well preserved between the collection in the colon on the current study. 2. Interval development of moderate to marked bilateral hydroureteronephrosis despite presence of bilateral ureteral stents. 3. Aortic atherosclerosis.    VS BP 114/ 66, HR 70  RR 18  RA 100%  Temp 98   Na 131 K 5.5  CO2 7  BUN 81  Cr 7.19  Ca 8.0  Hb 8.9  WBC 7k  plt 220   COVID negative   UA many bact >50 rbc/ wbc, 100 prot, turbid     Assessment/ Plan: 1. AoCKD 4 - b/l creat 2.4- 2.8, eGFR 24- 84m/min. AKI presumably due to obstruction w/ new and severe bilat hydronephrosis by imaging.  Also with hypotension/ischemic and pre-renal insults. Urology did procedure 9/25 w/ L ureteral stent replaced but could not get R ureteral stent in position.  Had bilateral neph tubes with IR on 9/27.  Decreased renal reserve with CKD stage IV prior to current episode.  Last Cr 2.78 on 12/08/19 before current admission 1. Some improvement with neph tubes and fluids 2. Continue normal saline at 125 mL/hr   3. 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 Hopeful  for continued improvement with conservative measures; he would want dialysis if his renal function deteriorated and we have discussed the risks/benefits/indications.  Do note recent stroke.  2. Bradycardia - no longer hyperkalemic and not on nodal blocking agents  3. Met acidosis - improved on oral bicarb 4. Hypokalemia - replete with 30 meq PO potassium; mag is replete; replete Ca  5. Hematuria/ bilat hydronephrosis/ hx ureteral stents - tortuous ureters per the OP note by urology.  6. Possible UTI - on abx per primary team   7. H/o CVA - left hemiparesis, had been at a SNF  8. Hypocalcemia - replete again with IV calcium gluconate.  intact PTH pending  9. hyperphos - renvela with meals. Renal diet; improving  10. Normocytic anemia - iron deficient. Repleted with  feraheme on 9/29.  aranesp once 40 mcg on 9/30  Continue inpatient monitoring.  Spoke with primary team   Recent Labs  Lab 12/29/19 0412 12/30/19 0603  K 3.8 3.3*  BUN 89* 76*  CREATININE 7.98* 7.77*  CALCIUM 6.8* 6.8*  PHOS 9.0* 7.7*  HGB 9.0* 8.7*   Inpatient medications: . Chlorhexidine Gluconate Cloth  6 each Topical Daily  . feeding supplement (ENSURE ENLIVE)  237 mL Oral TID BM  . feeding supplement (NEPRO CARB STEADY)  237 mL Oral TID BM  . fluconazole  200 mg Oral Daily  . iohexol      . multivitamin with minerals  1 tablet Oral Daily  . nicotine  14 mg Transdermal Daily  . nutrition supplement (JUVEN)  1 packet Oral BID BM  . polyethylene glycol  17 g Oral BID  . sevelamer carbonate  800 mg Oral TID WC  . sodium bicarbonate  1,300 mg Oral BID  . sodium chloride flush  5 mL Intracatheter Q8H  . tamsulosin  0.4 mg Oral Daily   . sodium chloride 125 mL/hr at 12/30/19 1135   acetaminophen, HYDROcodone-acetaminophen, hydrOXYzine   Claudia Desanctis, MD 12/30/2019  2:52 PM

## 2019-12-30 NOTE — Progress Notes (Signed)
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    CT abdomen on admission showed severe bilateral hydronephrosis and possible colovesical fistula. On admission creatinine 7.1 (baseline 2.4-2.8).   Admitted for acute on chronic kidney disease, severe anion gap metabolic acidosis, hyperkalemia, bilateral hydronephrosis.  Nephrology and urology consulting.  9/25, s/p left ureteral stent replaced but right ureteral stent unsuccessful.  Bilateral percutaneous nephrostomies placed by IR 12/27/19.  His creatinine peaked at 8.47 and started to show some signs of downtrending. Urine output remained adequate consistent with ATN/diuretic phase. He underwent repeat CT abdomen/pelvis on 12/30/2019 which revealed "Perivesical fluid collection is again noted, which currently measures 6.0 x 4.3 cm in the transverse orientation which may be slightly enlarged compared to prior exam".  IR was consulted for possible drain placement into fluid collection.   Interval History:  No events overnight.   Mood is better today and he was very apologetic of his behavior yesterday. We discussed again his mild but nonetheless improvement in renal function. Nephrostomy drainage on the left side continues to be bloody in appearance with mixed urine.  He underwent repeat CT abdomen/pelvis today which revealed a slightly larger and persistent fluid  collection that is perivesical measuring 6 x 4.3 cm. Tentative plan is for attempting to see if IR can place drain.   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  Baseline creatinine 2.4-2.8.  Presented with creatinine of 7.1, potassium 5.5 and bicarbonate 7.  CT abdomen showed bilateral severe hydronephrosis.  Foley catheter in place.   -Creatinine appears to have peaked at 8.47 on 12/28/2019. Appears to be in his diuretic phase - On 9/25 per Urology s/p left ureteral stent replaced but right ureteral stent unsuccessful due to tortuosity - now s/p B/L PCN by IR on 9/27.  - Still holding off on dialysis and does not meet criteria. Appreciate nephrology assistance -strict I&O  Colovesical fistula Perivesical fluid collection/abscess - greatly appreciate multi-modality input (surgery, nephrology, urology, IR) - definitive repair still being determined  - agree that he'll likely need abx again; given afebrile, no leukocytosis, would like to hold off on initiation until IR drain placement to yield higher culture results, then can start empiric treatment and await culture results - for now continue fluconazole (drain cultures from 9/27 growing C. Albicans in both right/left cultures); do not think he needs broader anti-fungal coverage unless develops further systemic signs of infection; holding off on ID consult for now as well but we can bring them on depending on IR drain culture results - if he does spike fever or significant leukocytosis, needs immediate blood cultures  Complicated UTI: In a patient with recent urological interventions, dysuria although he is afebrile and without leukocytosis.  Previous  urine cultures had  shown E. coli and Pseudomonas.  Patient was started on IV Zosyn.  However urine culture now shows 70 K colonies of yeast.  Discussed with infectious disease MD on call who recommended starting fluconazole and IV Zosyn discontinued.  Hyperkalemia/hypokalemia: Hyperkalemia resolved.  Treating as indicated   Anion gap metabolic acidosis: Bicarbonate drip discontinued and started oral bicarbonate 9/27.  Hypocalcemia: Replete and recheck as needed - follow up iPTH  Hyperphosphatemia -Renvela started per nephrology  Bilateral hydronephrosis/ureteral stent/hematuria: Urology input appreciated. On 9/25 s/p left ureteral stent replaced but could not get right ureteral stent due to tortuosity. Now bilateral nephrostomy tube placement by IR 9/27.    Acute blood loss anemia complicating chronic microcytic anemia Hemoglobin has dropped from 8 g range to 6.7-6.8, confirmed on redraw.  S/p 2 units PRBC and hemoglobin up to 9.7.   Anxiety and depression: Cymbalta on hold due to renal insufficiency  History of CVA -CVA on 10/18/2019 MRI.  Small bilateral acute cerebral and cerebellar infarcts.  Residual left hemiparesis  Body mass index is 21.49 kg/m.  Nutritional Status Nutrition Problem: Inadequate oral intake Etiology: acute illness (bilateral hydronephrosis) Signs/Symptoms: meal completion < 25% Interventions: Ensure Enlive (each supplement provides 350kcal and 20 grams of protein), MVI, Juven  Antimicrobials: Zosyn 12/25/2019>>9/27 Fluconazole 9/27>> present   DVT prophylaxis: SCD Code Status: Full Family Communication: None present Disposition Plan: Status is: Inpatient  Remains inpatient appropriate because:Ongoing diagnostic testing needed not appropriate for outpatient work up, IV treatments appropriate due to intensity of illness or inability to take PO and Inpatient level of care appropriate due to severity of illness   Dispo: The patient is from: Home               Anticipated d/c is to: SNF              Anticipated d/c date is: > 3 days              Patient currently is not medically stable to d/c.  Objective: Blood pressure 127/63, pulse (!) 44, temperature 98.2 F (36.8 C), temperature source Oral, resp. rate 18, height 5\' 3"  (1.6 m), weight 59.9 kg, SpO2 100 %.  Examination: General appearance: alert, cooperative and no distress Head: Normocephalic, without obvious abnormality, atraumatic Eyes: EOMI Lungs: clear to auscultation bilaterally Heart: regular rate and rhythm and S1, S2 normal Abdomen: normal findings: bowel sounds normal and soft, non-tender and Left nephrostomy tube noted with bloody urine (slightly less today).  Right nephrostomy tube clear urine and Foley catheter clear urine Extremities: no edema Skin: mobility and turgor normal Neurologic: no focal deficits  Consultants:   Nephrology  Urology  Surgery  IR  Procedures:   none  Data Reviewed: I have personally reviewed following labs and imaging studies Results for orders placed or performed during the hospital encounter of 12/24/19 (from the past 24 hour(s))  CBC with Differential/Platelet     Status: Abnormal   Collection Time: 12/30/19  6:03 AM  Result Value Ref Range   WBC 6.7 4.0 - 10.5 K/uL   RBC 2.88 (L) 4.22 - 5.81 MIL/uL   Hemoglobin 8.7 (L) 13.0 - 17.0 g/dL   HCT 27.5 (L) 39 - 52 %   MCV 95.5 80.0 - 100.0 fL   MCH 30.2 26.0 - 34.0 pg   MCHC 31.6 30.0 - 36.0 g/dL   RDW 18.1 (H) 11.5 - 15.5 %   Platelets 208 150 - 400 K/uL   nRBC 0.0  0.0 - 0.2 %   Neutrophils Relative % 62 %   Neutro Abs 4.2 1.7 - 7.7 K/uL   Lymphocytes Relative 21 %   Lymphs Abs 1.4 0.7 - 4.0 K/uL   Monocytes Relative 7 %   Monocytes Absolute 0.5 0 - 1 K/uL   Eosinophils Relative 8 %   Eosinophils Absolute 0.5 0 - 0 K/uL   Basophils Relative 1 %   Basophils Absolute 0.0 0 - 0 K/uL   Immature Granulocytes 1 %   Abs Immature Granulocytes 0.06 0.00 - 0.07 K/uL  Magnesium      Status: None   Collection Time: 12/30/19  6:03 AM  Result Value Ref Range   Magnesium 2.1 1.7 - 2.4 mg/dL  Renal function panel     Status: Abnormal   Collection Time: 12/30/19  6:03 AM  Result Value Ref Range   Sodium 142 135 - 145 mmol/L   Potassium 3.3 (L) 3.5 - 5.1 mmol/L   Chloride 110 98 - 111 mmol/L   CO2 18 (L) 22 - 32 mmol/L   Glucose, Bld 120 (H) 70 - 99 mg/dL   BUN 76 (H) 6 - 20 mg/dL   Creatinine, Ser 7.77 (H) 0.61 - 1.24 mg/dL   Calcium 6.8 (L) 8.9 - 10.3 mg/dL   Phosphorus 7.7 (H) 2.5 - 4.6 mg/dL   Albumin 2.0 (L) 3.5 - 5.0 g/dL   GFR calc non Af Amer 7 (L) >60 mL/min   GFR calc Af Amer 8 (L) >60 mL/min   Anion gap 14 5 - 15    Recent Results (from the past 240 hour(s))  Urine culture     Status: Abnormal   Collection Time: 12/24/19 10:33 AM   Specimen: Urine, Clean Catch  Result Value Ref Range Status   Specimen Description   Final    URINE, CLEAN CATCH Performed at [facility], [place] [address], [place], [place] [address]    Special Requests   Final    NONE Performed at [facility], [place] [address] [place], [place] [address]    Culture 70,000 COLONIES/mL YEAST (A)  Final   Report Status 12/26/2019 FINAL  Final  Respiratory Panel by RT PCR (Flu A&B, Covid) - Nasopharyngeal Swab     Status: None   Collection Time: 12/24/19  2:22 PM   Specimen: Nasopharyngeal Swab  Result Value Ref Range Status   SARS Coronavirus 2 by RT PCR NEGATIVE NEGATIVE Final    Comment: (NOTE) SARS-CoV-2 target nucleic acids are NOT DETECTED.  The SARS-CoV-2 RNA is generally detectable in upper respiratoy specimens during the acute phase of infection. The lowest concentration of SARS-CoV-2 viral copies this assay can detect is 131 copies/mL. A negative result does not preclude SARS-Cov-2 infection and should not be used as the sole basis for treatment or other patient management decisions. A negative result may occur with  improper specimen  collection/handling, submission of specimen other than nasopharyngeal swab, presence of viral mutation(s) within the areas targeted by this assay, and inadequate number of viral copies (<131 copies/mL). A negative result must be combined with clinical observations, patient history, and epidemiological information. The expected result is Negative.  Fact Sheet for Patients:  PinkCheek.be  Fact Sheet for Healthcare Providers:  GravelBags.it  This test is no t yet approved or cleared by the [place] FDA and  has been authorized for detection and/or diagnosis of SARS-CoV-2 by FDA under an Emergency Use Authorization (EUA). This EUA will remain  in effect (meaning this test can be used) for the duration of the COVID-19 declaration under Section 564(b)(1) of the Act, 21 U.S.C. section 360bbb-3(b)(1), unless the authorization is terminated or revoked sooner.     Influenza A by PCR NEGATIVE NEGATIVE Final   Influenza B by PCR NEGATIVE NEGATIVE Final    Comment: (NOTE) The Xpert Xpress SARS-CoV-2/FLU/RSV assay is intended as an aid in  the diagnosis of influenza from Nasopharyngeal swab specimens and  should not be used as a sole basis for treatment. Nasal washings and  aspirates are unacceptable for Xpert Xpress SARS-CoV-2/FLU/RSV  testing.  Fact Sheet for Patients: PinkCheek.be  Fact Sheet for Healthcare Providers: GravelBags.it  This test is not yet approved or cleared by the [place] FDA and  has been authorized for detection and/or diagnosis of SARS-CoV-2 by  FDA under an Emergency Use Authorization (EUA). This EUA will remain  in effect (meaning this test can be used) for the duration of the  Covid-19 declaration under Section 564(b)(1) of the Act, 21  U.S.C. section 360bbb-3(b)(1), unless the authorization is  terminated or revoked. Performed at [facility], [place] [address] [place], [place] [address]   Aerobic/Anaerobic Culture (surgical/deep wound)     Status: None (Preliminary result)   Collection Time: 12/27/19  4:26 PM   Specimen: Abscess  Result Value Ref Range Status   Specimen Description   Final    ABSCESS LEFT KIDNEY Performed at [place] [address] [place], [place] [address]    Special Requests   Final    NONE Performed at [facility], [place] [address] [place], [place] [address]    Gram Stain   Final    MODERATE WBC PRESENT, PREDOMINANTLY MONONUCLEAR FEW YEAST Performed at [facility], [place] [address] [place], [place] [address]    Culture   Final    FEW CANDIDA ALBICANS NO ANAEROBES ISOLATED; CULTURE IN PROGRESS FOR 5 DAYS    Report Status PENDING  Incomplete  Aerobic/Anaerobic Culture (surgical/deep wound)     Status: None (Preliminary result)   Collection Time: 12/27/19  4:36 PM   Specimen: Abscess  Result Value Ref Range Status   Specimen Description   Final    ABSCESS RIGHT KIDNEY Performed at [place] [address] [place], [place] [address]    Special Requests   Final    NONE Performed at [facility], [place] [address], [place], [place] [address]    Gram Stain   Final    RARE WBC PRESENT,BOTH PMN AND MONONUCLEAR NO ORGANISMS SEEN Performed at [facility], [place] [address], [place],  [address]    Culture   Final    FEW CANDIDA ALBICANS NO ANAEROBES ISOLATED; CULTURE IN PROGRESS FOR 5 DAYS    Report Status PENDING  Incomplete     Radiology Studies: CT ABDOMEN PELVIS WO CONTRAST  Result Date: 12/30/2019 CLINICAL DATA:  Abdominal pain. EXAM: CT ABDOMEN AND PELVIS WITHOUT CONTRAST TECHNIQUE: Multidetector CT imaging of the abdomen and pelvis was performed following the standard protocol without IV contrast. COMPARISON:  December 24, 2019. FINDINGS: Lower chest: Small  bilateral pleural effusions are noted with adjacent subsegmental atelectasis. Hepatobiliary: No focal liver abnormality is seen. Status post cholecystectomy. No biliary dilatation. Pancreas: Unremarkable. No pancreatic ductal dilatation or surrounding inflammatory changes. Spleen: Normal in size without focal abnormality. Adrenals/Urinary Tract: Adrenal glands appear normal. Interval placement of bilateral percutaneous nephrostomies. Left  ureteral stent is in grossly good position. Right ureteral stent noted on prior exam has been removed. No definite hydronephrosis or renal obstruction is seen at this time. Foley catheter is noted within the urinary bladder. Perivesical fluid collection is again noted, which currently measures 6.0 x 4.3 cm in the transverse orientation which may be slightly enlarged compared to prior exam. Fluid within urinary bladder appears to be high density suggesting possible hemorrhage. Stomach/Bowel: Status post gastric bypass. There is no evidence of bowel obstruction or inflammation. The appendix appears normal. Vascular/Lymphatic: Aortic atherosclerosis. No enlarged abdominal or pelvic lymph nodes. Reproductive: Prostate is unremarkable. Other: Mild anasarca is noted.  No definite hernia is noted. Musculoskeletal: No acute or significant osseous findings. IMPRESSION: 1. Interval placement of bilateral percutaneous nephrostomies. Left ureteral stent is in grossly good position. Right ureteral stent noted on prior exam has been removed. No definite hydronephrosis or renal obstruction is seen at this time. 2. Perivesical fluid collection is again noted, which currently measures 6.0 x 4.3 cm in the transverse orientation which may be slightly enlarged compared to prior exam. Fluid within urinary bladder appears to be high density suggesting possible hemorrhage. 3. Small bilateral pleural effusions are noted with adjacent subsegmental atelectasis. 4. Mild anasarca. 5. Aortic atherosclerosis.  Aortic Atherosclerosis (ICD10-I70.0). Electronically Signed   By: Marijo Conception M.D.   On: 12/30/2019 12:47   CT ABDOMEN PELVIS WO CONTRAST  Final Result    IR NEPHROSTOMY PLACEMENT RIGHT  Final Result    IR NEPHROSTOMY PLACEMENT LEFT  Final Result    US RENAL  Final Result    DG C-Arm 1-60 Min-No Report  Final Result    CT Renal Stone Study  Final Result  Addendum 1 of 1  ADDENDUM REPORT: 12/24/2019 12:35    ADDENDUM:  The possibility of a leak in the posterior urinary bladder in this  patient thought to have a fistula was again discussed with the  provider. Additionally, the presence of marked bilateral  hydronephrosis, worse on the RIGHT also was discussed. Perhaps a  cystogram prior to stented exchange if will not significantly delay  care could be helpful for further management as clinically  warranted. This would avoid the confounding effect of contrast which  could be introduced during stent exchange. Urologic consultation was  suggested.    These results were called by telephone at the time of interpretation  on 12/24/2019 at 12:34 pm to provider [facility] , who verbally  acknowledged these results.      Electronically Signed    By: Zetta Bills M.D.    On: 12/24/2019 12:35      Final    CT IMAGE GUIDE DRAIN TRANSVAG TRANSRECT PERITONEAL RETROPER    (Results Pending)  US PELVIS LIMITED (TRANSABDOMINAL ONLY)    (Results Pending)    Scheduled Meds: . Chlorhexidine Gluconate Cloth  6 each Topical Daily  . darbepoetin (ARANESP) injection - NON-DIALYSIS  40 mcg Subcutaneous Once  . feeding supplement (ENSURE ENLIVE)  237 mL Oral TID BM  . feeding supplement (NEPRO CARB STEADY)  237 mL Oral TID BM  . fluconazole  200 mg Oral Daily  . iohexol      . multivitamin with minerals  1 tablet Oral Daily  . nicotine  14 mg Transdermal Daily  . nutrition supplement (JUVEN)  1 packet Oral BID BM  . polyethylene glycol  17 g Oral BID  . potassium chloride  30  mEq Oral Once  . sevelamer carbonate  800 mg Oral TID WC  . sodium bicarbonate  1,300 mg Oral BID  . sodium chloride flush  5 mL Intracatheter Q8H  . tamsulosin  0.4 mg Oral Daily   PRN Meds: acetaminophen, HYDROcodone-acetaminophen, hydrOXYzine Continuous Infusions: . sodium chloride 125 mL/hr at 12/30/19 1135  . calcium gluconate        LOS: 6 days  Time spent: Greater than 50% of the 35 minute visit was spent in counseling/coordination of care for the patient as laid out in the A&P.   Dwyane Dee, MD [facility] 12/30/2019, 3:07 PM  Contact via secure chat.  To contact the attending provider between 7A-7P or the covering provider during after hours 7P-7A, please log into the web site [email or web address] and access using universal [place] password for that web site. If you do not have the password, please call the hospital operator.

## 2019-12-30 NOTE — Progress Notes (Signed)
Daily Progress Note   Patient Name: Chase [name]       Date: 12/30/2019 DOB: 08/31/1965  Age: 54 y.o. MRN#: [id number] Attending Physician: Dwyane Dee, MD Primary Care Physician: [name] Admit Date: 12/24/2019  Reason for Consultation/Follow-up: Establishing goals of care  Subjective: Awake alert resting in bed.  Going over his menu.  Patient discusses extensively about limitations of renal diet and his conversations with dietary team over here. See below.   Length of Stay: 6  Current Medications: Scheduled Meds:  . Chlorhexidine Gluconate Cloth  6 each Topical Daily  . feeding supplement (ENSURE ENLIVE)  237 mL Oral TID BM  . feeding supplement (NEPRO CARB STEADY)  237 mL Oral TID BM  . fluconazole  200 mg Oral Daily  . iohexol  500 mL Oral Q1H  . iohexol      . multivitamin with minerals  1 tablet Oral Daily  . nicotine  14 mg Transdermal Daily  . nutrition supplement (JUVEN)  1 packet Oral BID BM  . polyethylene glycol  17 g Oral BID  . sevelamer carbonate  800 mg Oral TID WC  . sodium bicarbonate  1,300 mg Oral BID  . sodium chloride flush  5 mL Intracatheter Q8H  . tamsulosin  0.4 mg Oral Daily    Continuous Infusions: . sodium chloride 125 mL/hr at 12/29/19 0811    PRN Meds: acetaminophen, HYDROcodone-acetaminophen, hydrOXYzine  Physical Exam         Awake alert resting in bed Appears cachectic, appears pale Has percutaneous nephrostomy tubes has Foley Appears with some muscle wasting Has regular work of breathing S1-S2 Abdomen is not tender  Vital Signs: BP 108/68 (BP Location: Left Arm)   Pulse (!) 41   Temp 98.2 F (36.8 C) (Oral)   Resp 16   Ht 5\' 3"  (1.6 m)   Wt 59.9 kg   SpO2 98%   BMI 23.38 kg/m  SpO2: SpO2: 98 % O2  Device: O2 Device: Room Air O2 Flow Rate: O2 Flow Rate (L/min): 2 L/min  Intake/output summary:   Intake/Output Summary (Last 24 hours) at 12/30/2019 1101 Last data filed at 12/30/2019 0900 Gross per 24 hour  Intake 2689.7 ml  Output 2450 ml  Net 239.7 ml   LBM: Last BM Date: 12/28/19 Baseline Weight: Weight:  53.8 kg Most recent weight: Weight: 59.9 kg      Palliative performance scale 50% Palliative Assessment/Data:    Flowsheet Rows     Most Recent Value  Intake Tab  Referral Department Hospitalist  Unit at Time of Referral Med/Surg Unit  Palliative Care Primary Diagnosis Nephrology  Date Notified 12/24/19  Palliative Care Type New Palliative care  Reason for referral Clarify Goals of Care  Date of Admission 12/24/19  Date first seen by Palliative Care 12/26/19  # of days Palliative referral response time 2 Day(s)  # of days IP prior to Palliative referral 0  Clinical Assessment  Palliative Performance Scale Score 60%  Psychosocial & Spiritual Assessment  Palliative Care Outcomes  Patient/Family meeting held? Yes  Who was at the meeting? Patient  Palliative Care Outcomes Clarified goals of care      Patient Active Problem List   Diagnosis Date Noted  . Bilateral ureteral obstruction s/p perc nephrosotmy & stenting 12/30/2019  . AKI (acute kidney injury) ([place]) 12/24/2019  . 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 Pelvic abscess with colovescial fistula 11/15/2019  . E. coli UTI (urinary tract infection) 11/15/2019  . PTSD (post-traumatic stress disorder)   . Chemical exposure   . Cervical radiculopathy   . Septic shock ([place]) 11/08/2019  . Protein-calorie malnutrition, severe 10/23/2019  . Cerebral embolism with cerebral infarction 10/18/2019  . Acute left-sided weakness 10/17/2019  . CKD (chronic kidney disease), stage IV ([place]) 10/17/2019  . Chronic pain 10/17/2019  . Fall 10/17/2019  . Pseudomonas urinary tract infection 08/24/2019  . BPH (benign prostatic  hyperplasia) 08/24/2019  . Acute on chronic renal failure ([place]) 08/24/2019  . Anemia 08/24/2019  . Dyspnea 08/24/2019  . History of COVID-19 08/24/2019  . History of elevated PSA 08/24/2019  . 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 Hyponatremia 08/24/2019  . Metabolic acidosis 92/42/6834  . Skin lesion of neck 08/24/2019  . Toxic metabolic encephalopathy 19/62/2297  . Cervical spondylosis without myelopathy 06/19/2016  . Neck pain 09/04/2015  . History of stomach ulcers 05/19/2013  . Male erectile dysfunction 05/19/2013  . Hodgkin lymphoma, unspecified, unspecified site ([place]) 12/10/2012  . Iron deficiency anemia 12/10/2012  . Pernicious anemia 12/10/2012  . Arthralgia of multiple sites 04/20/2012  . Postlaminectomy syndrome, not elsewhere classified 04/20/2012    Palliative Care Assessment & Plan   Patient Profile:    Assessment: Acute on chronic kidney disease hydronephrosis ureteral obstruction deemed possibly due to inflammation, probable diverticular abscess, probable colovesical fistula. Past medical history also significant for CKD, GI bleed, chronic pain, gastric bypass  Recommendations/Plan:  Goals of care meeting: Patient is aware of the acute nature of this hospitalization.  Overall, he is keeping an eye on his renal indices and is aware of his current serum creatinine levels.  He states, " if I need dialysis I need dialysis.  But I want to find out if I can get dialysis at [facility].  I do not know how I feel about having to go to [facility] if I need dialysis." Goals wishes and values reviewed.  Full code/full scope care for now.  Overall, patient wishes to continue current mode of care for now, monitor his disease trajectory of illness.  At any point in the future, if the burden of therapies offered gets to be intolerable for the patient, at that time, he states he would prioritize quality over quantity and that he would not be afraid to die.  His mother is his next  of kin.  He states that he has already seen a lot of grief, his wife and daughter are deceased.  Offered active listening and supportive care.  Continue full code full scope care for now as per patient's preferences.  Goals of Care and Additional Recommendations:  Limitations on Scope of Treatment: Full Scope Treatment  Code Status:    Code Status Orders  (From admission, onward)         Start     Ordered   12/24/19 1538  Full code  Continuous        12/24/19 1537        Code Status History    Date Active Date Inactive Code Status Order ID Comments User Context   11/08/2019 0019 11/19/2019 2232 Full Code [id number]  Jacalyn Lefevre, MD ED   10/17/2019 1832 10/26/2019 1620 Full Code [id number]  Mendel Corning, MD Inpatient   Advance Care Planning Activity       Prognosis:   Unable to determine  Discharge Planning:  To Be Determined Recommend skilled nursing facility rehab, patient wishes to go back to [place] [facility] and to have physical therapy continued.  Care plan was discussed with patient, nursing colleagues  Thank you for allowing the Palliative Medicine Team to assist in the care of this patient.   Time In: 9 Time Out:  9.35 Total Time  35 Prolonged Time Billed  no       Greater than 50%  of this time was spent counseling and coordinating care related to the above assessment and plan.  Loistine Chance, MD  Please contact Palliative Medicine Team phone at [phone] for questions and concerns.

## 2019-12-30 NOTE — Progress Notes (Signed)
Physical Therapy Treatment Patient Details Name: Chase [name] MRN: [id number] DOB: 1965/04/07 Today's Date: 12/30/2019    History of Present Illness 54 yo male admitted from SNF ([facility]) with AKI s/p bil PCN 9/27, colovesical fistula. hx of CKD, CVA 09/2019, Hodgkins lympoma, PTSD, chronic pain    PT Comments    General Comments: AxO x 4 eager to "get back to Rehab" at [place] then get back home to his pups.  Pt admitted he was "grumpy" yesterday "because of the food" pt dislkes his Renal Diet.  Today he talked as we walked.  Pt has his Masters from [employer] and served for 6 years.  He is widowed and has 2 son's.  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 Assisted OOB to amb in hallway.  General Gait Details: Min guard for safety. Cues for RW proximity. Tolerated an increased distance.  Too unsteady without walker due to L LE weakness.  Follow Up Recommendations  SNF     Equipment Recommendations  None recommended by PT    Recommendations for Other Services       Precautions / Restrictions Precautions Precautions: Fall Precaution Comments: B  PCN tubes + foley cath    Mobility  Bed Mobility Overal bed mobility: Needs Assistance Bed Mobility: Supine to Sit     Supine to sit: Min assist;Min guard Sit to supine: Min assist   General bed mobility comments: Assist for L LE esp up onto bed  Transfers Overall transfer level: Needs assistance Equipment used: Rolling walker (2 wheeled) Transfers: Sit to/from [facility] Sit to Stand: Min guard Stand pivot transfers: Min guard;Min assist       General transfer comment: Min guard for safety. VCs safety, hand placement. increased time.  Ambulation/Gait Ambulation/Gait assistance: Min guard Gait Distance (Feet): 125 Feet Assistive device: Rolling walker (2 wheeled) Gait Pattern/deviations: Step-through pattern;Decreased stride length Gait velocity: decreased   General  Gait Details: Min guard for safety. Cues for RW proximity. Tolerated an increased distance.  Too unsteady without walker due to L LE weakness.   Stairs             Wheelchair Mobility    Modified Rankin (Stroke Patients Only)       Balance                                            Cognition Arousal/Alertness: Awake/alert Behavior During Therapy: WFL for tasks assessed/performed Overall Cognitive Status: Within Functional Limits for tasks assessed                                 General Comments: AxO x 4 eager to "get back to Rehab" at [place] then get back home to his pups.  Pt admitted he was "grumpy" yesterday "because of the food" pt dislkes his Renal Diet.  Today he talked as we walked.  Pt has his Masters from [employer] and served for 6 years.  He is widoed and has 2 son's.  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      Exercises      General Comments        Pertinent Vitals/Pain Pain Assessment: Faces Faces Pain Scale: Hurts a little bit Pain Location: back  "  too much laying around" Pain Descriptors / Indicators: Aching;Discomfort;Sore Pain Intervention(s): Monitored during session    Home Living                      Prior Function            PT Goals (current goals can now be found in the care plan section) Progress towards PT goals: Progressing toward goals    Frequency    Min 2X/week      PT Plan Current plan remains appropriate    Co-evaluation              AM-PAC PT "6 Clicks" Mobility   Outcome Measure  Help needed turning from your back to your side while in a flat bed without using bedrails?: A Little Help needed moving from lying on your back to sitting on the side of a flat bed without using bedrails?: A Little Help needed moving to and from a bed to a chair (including a wheelchair)?: A Little Help needed standing up from a chair using your arms  (e.g., wheelchair or bedside chair)?: A Little Help needed to walk in hospital room?: A Little Help needed climbing 3-5 steps with a railing? : A Lot 6 Click Score: 17    End of Session Equipment Utilized During Treatment: Gait belt Activity Tolerance: Patient tolerated treatment well Patient left: in bed;with call bell/phone within reach;with bed alarm set Nurse Communication: Mobility status PT Visit Diagnosis: Muscle weakness (generalized) (M62.81);Unsteadiness on feet (R26.81);Hemiplegia and hemiparesis Hemiplegia - Right/Left: Left Hemiplegia - dominant/non-dominant: Non-dominant Hemiplegia - caused by: Cerebral infarction     Time: 0630-1601 PT Time Calculation (min) (ACUTE ONLY): 25 min  Charges:  $Gait Training: 23-37 mins                     Rica Koyanagi  PTA Acute  Rehabilitation Services Pager      [phone] Office      [phone]

## 2019-12-31 ENCOUNTER — Inpatient Hospital Stay (HOSPITAL_COMMUNITY): Payer: No Typology Code available for payment source

## 2019-12-31 DIAGNOSIS — N184 Chronic kidney disease, stage 4 (severe): Secondary | ICD-10-CM

## 2019-12-31 DIAGNOSIS — D509 Iron deficiency anemia, unspecified: Secondary | ICD-10-CM

## 2019-12-31 DIAGNOSIS — B3749 Other urogenital candidiasis: Secondary | ICD-10-CM

## 2019-12-31 DIAGNOSIS — N170 Acute kidney failure with tubular necrosis: Secondary | ICD-10-CM

## 2019-12-31 DIAGNOSIS — D62 Acute posthemorrhagic anemia: Secondary | ICD-10-CM

## 2019-12-31 DIAGNOSIS — N135 Crossing vessel and stricture of ureter without hydronephrosis: Secondary | ICD-10-CM

## 2019-12-31 DIAGNOSIS — E878 Other disorders of electrolyte and fluid balance, not elsewhere classified: Secondary | ICD-10-CM

## 2019-12-31 DIAGNOSIS — Z681 Body mass index (BMI) 19 or less, adult: Secondary | ICD-10-CM

## 2019-12-31 DIAGNOSIS — E8729 Other acidosis: Secondary | ICD-10-CM

## 2019-12-31 DIAGNOSIS — F32A Depression, unspecified: Secondary | ICD-10-CM

## 2019-12-31 DIAGNOSIS — Z8673 Personal history of transient ischemic attack (TIA), and cerebral infarction without residual deficits: Secondary | ICD-10-CM

## 2019-12-31 HISTORY — DX: Other urogenital candidiasis: B37.49

## 2019-12-31 HISTORY — DX: Personal history of transient ischemic attack (TIA), and cerebral infarction without residual deficits: Z86.73

## 2019-12-31 LAB — CBC WITH DIFFERENTIAL/PLATELET
Abs Immature Granulocytes: 0.05 10*3/uL (ref 0.00–0.07)
Basophils Absolute: 0.1 10*3/uL (ref 0.0–0.1)
Basophils Relative: 1 %
Eosinophils Absolute: 0.5 10*3/uL (ref 0.0–0.5)
Eosinophils Relative: 6 %
HCT: 26.9 % — ABNORMAL LOW (ref 39.0–52.0)
Hemoglobin: 8.7 g/dL — ABNORMAL LOW (ref 13.0–17.0)
Immature Granulocytes: 1 %
Lymphocytes Relative: 15 %
Lymphs Abs: 1.3 10*3/uL (ref 0.7–4.0)
MCH: 30.5 pg (ref 26.0–34.0)
MCHC: 32.3 g/dL (ref 30.0–36.0)
MCV: 94.4 fL (ref 80.0–100.0)
Monocytes Absolute: 0.5 10*3/uL (ref 0.1–1.0)
Monocytes Relative: 7 %
Neutro Abs: 5.8 10*3/uL (ref 1.7–7.7)
Neutrophils Relative %: 70 %
Platelets: 215 10*3/uL (ref 150–400)
RBC: 2.85 MIL/uL — ABNORMAL LOW (ref 4.22–5.81)
RDW: 17.9 % — ABNORMAL HIGH (ref 11.5–15.5)
WBC: 8.2 10*3/uL (ref 4.0–10.5)
nRBC: 0 % (ref 0.0–0.2)

## 2019-12-31 LAB — RENAL FUNCTION PANEL
Albumin: 1.9 g/dL — ABNORMAL LOW (ref 3.5–5.0)
Anion gap: 14 (ref 5–15)
BUN: 68 mg/dL — ABNORMAL HIGH (ref 6–20)
CO2: 18 mmol/L — ABNORMAL LOW (ref 22–32)
Calcium: 7.2 mg/dL — ABNORMAL LOW (ref 8.9–10.3)
Chloride: 113 mmol/L — ABNORMAL HIGH (ref 98–111)
Creatinine, Ser: 6.89 mg/dL — ABNORMAL HIGH (ref 0.61–1.24)
GFR calc Af Amer: 9 mL/min — ABNORMAL LOW (ref >60)
GFR calc non Af Amer: 8 mL/min — ABNORMAL LOW (ref >60)
Glucose, Bld: 92 mg/dL (ref 70–99)
Phosphorus: 7.4 mg/dL — ABNORMAL HIGH (ref 2.5–4.6)
Potassium: 3.5 mmol/L (ref 3.5–5.1)
Sodium: 145 mmol/L (ref 135–145)

## 2019-12-31 LAB — PARATHYROID HORMONE, INTACT (NO CA): PTH: 50 pg/mL (ref 15–65)

## 2019-12-31 LAB — MAGNESIUM: Magnesium: 2.1 mg/dL (ref 1.7–2.4)

## 2019-12-31 MED ORDER — LACTATED RINGERS IV SOLN: INTRAVENOUS | Status: DC

## 2019-12-31 MED ORDER — FENTANYL CITRATE (PF) 100 MCG/2ML IJ SOLN
INTRAMUSCULAR | Status: AC | PRN
  Administered 2019-12-31 (×2): 50 ug via INTRAVENOUS

## 2019-12-31 MED ORDER — MIDAZOLAM HCL 2 MG/2ML IJ SOLN
INTRAMUSCULAR | Status: AC
  Filled 2019-12-31: qty 4

## 2019-12-31 MED ORDER — POTASSIUM CHLORIDE CRYS ER 20 MEQ PO TBCR
20.0000 meq | EXTENDED_RELEASE_TABLET | Freq: Once | ORAL | Status: AC
  Administered 2019-12-31: 20 meq via ORAL
  Filled 2019-12-31: qty 1

## 2019-12-31 MED ORDER — POTASSIUM CHLORIDE CRYS ER 20 MEQ PO TBCR: 40.0000 meq | EXTENDED_RELEASE_TABLET | Freq: Once | ORAL | Status: DC

## 2019-12-31 MED ORDER — SODIUM CHLORIDE 0.9% FLUSH
5.0000 mL | Freq: Three times a day (TID) | INTRAVENOUS | Status: DC
  Administered 2019-12-31 – 2020-01-07 (×16): 5 mL

## 2019-12-31 MED ORDER — SODIUM CHLORIDE 0.9 % IV SOLN
500.0000 mg | Freq: Two times a day (BID) | INTRAVENOUS | Status: DC
  Administered 2019-12-31 – 2020-01-08 (×16): 500 mg via INTRAVENOUS
  Filled 2019-12-31 (×2): qty 500
  Filled 2019-12-31 (×2): qty 0.5
  Filled 2019-12-31 (×13): qty 500

## 2019-12-31 MED ORDER — CALCIUM GLUCONATE-NACL 2-0.675 GM/100ML-% IV SOLN
2.0000 g | Freq: Once | INTRAVENOUS | Status: AC
  Administered 2019-12-31: 2000 mg via INTRAVENOUS
  Filled 2019-12-31: qty 100

## 2019-12-31 MED ORDER — ACETAMINOPHEN 500 MG PO TABS: 500.0000 mg | ORAL_TABLET | Freq: Four times a day (QID) | ORAL | Status: DC | PRN

## 2019-12-31 MED ORDER — FENTANYL CITRATE (PF) 100 MCG/2ML IJ SOLN
INTRAMUSCULAR | Status: AC
Start: 2019-12-31 — End: 2020-01-01
  Filled 2019-12-31: qty 4

## 2019-12-31 MED ORDER — BISACODYL 10 MG RE SUPP
10.0000 mg | Freq: Every day | RECTAL | Status: DC
  Filled 2019-12-31 (×2): qty 1

## 2019-12-31 MED ORDER — ONDANSETRON HCL 4 MG PO TABS
4.0000 mg | ORAL_TABLET | Freq: Three times a day (TID) | ORAL | Status: DC | PRN
  Administered 2020-01-03 – 2020-01-05 (×3): 4 mg via ORAL
  Filled 2019-12-31 (×3): qty 1

## 2019-12-31 MED ORDER — MIDAZOLAM HCL 2 MG/2ML IJ SOLN
INTRAMUSCULAR | Status: AC | PRN
  Administered 2019-12-31 (×2): 1 mg via INTRAVENOUS

## 2019-12-31 MED ORDER — FLUCONAZOLE 100 MG PO TABS
200.0000 mg | ORAL_TABLET | Freq: Every day | ORAL | Status: DC
  Administered 2020-01-01 – 2020-01-08 (×8): 200 mg via ORAL
  Filled 2019-12-31 (×8): qty 2

## 2019-12-31 MED ORDER — BOOST / RESOURCE BREEZE PO LIQD CUSTOM
1.0000 | Freq: Three times a day (TID) | ORAL | Status: DC
  Administered 2020-01-04 – 2020-01-07 (×2): 1 via ORAL

## 2019-12-31 MED ORDER — POLYETHYLENE GLYCOL 3350 17 G PO PACK
34.0000 g | PACK | Freq: Two times a day (BID) | ORAL | Status: DC
  Administered 2019-12-31: 34 g via ORAL
  Filled 2019-12-31 (×3): qty 2

## 2019-12-31 NOTE — Progress Notes (Signed)
Patient ID: Chase [name], male   DOB: 12-27-1965, 54 y.o.   MRN: [id number]  6 Days Post-Op Subjective: Pt without new complaints.  CT performed yesterday per [name].  Objective: Vital signs in last 24 hours: Temp:  [98.2 F (36.8 C)-99 F (37.2 C)] 99 F (37.2 C) (10/01 0622) Pulse Rate:  [44-50] 47 (10/01 0622) Resp:  [18] 18 (10/01 0622) BP: (125-136)/(60-63) 136/63 (10/01 0622) SpO2:  [97 %-100 %] 98 % (10/01 0622) Weight:  [61.8 kg] 61.8 kg (10/01 0500)  Intake/Output from previous day: 09/30 0701 - 10/01 0700 In: 2796.2 [I.V.:2746.2; IV Piggyback:50] Out: 4098 [Urine:3825] Intake/Output this shift: No intake/output data recorded.  Physical Exam:  General: Alert and oriented Abd: B PCNs draining.  Left side still with red tinged urine but draining well.  Lab Results: Recent Labs    12/29/19 0412 12/30/19 0603 12/31/19 0528  HGB 9.0* 8.7* 8.7*  HCT 27.0* 27.5* 26.9*   CBC Latest Ref Rng & Units 12/31/2019 12/30/2019 12/29/2019  WBC 4.0 - 10.5 K/uL 8.2 6.7 9.0  Hemoglobin 13.0 - 17.0 g/dL 8.7(L) 8.7(L) 9.0(L)  Hematocrit 39 - 52 % 26.9(L) 27.5(L) 27.0(L)  Platelets 150 - 400 K/uL 215 208 206     BMET Recent Labs    12/30/19 0603 12/31/19 0528  NA 142 145  K 3.3* 3.5  CL 110 113*  CO2 18* 18*  GLUCOSE 120* 92  BUN 76* 68*  CREATININE 7.77* 6.89*  CALCIUM 6.8* 7.2*     Studies/Results: CT ABDOMEN PELVIS WO CONTRAST  Result Date: 12/30/2019 CLINICAL DATA:  Abdominal pain. EXAM: CT ABDOMEN AND PELVIS WITHOUT CONTRAST TECHNIQUE: Multidetector CT imaging of the abdomen and pelvis was performed following the standard protocol without IV contrast. COMPARISON:  December 24, 2019. FINDINGS: Lower chest: Small bilateral pleural effusions are noted with adjacent subsegmental atelectasis. Hepatobiliary: No focal liver abnormality is seen. Status post cholecystectomy. No biliary dilatation. Pancreas: Unremarkable. No pancreatic ductal dilatation or  surrounding inflammatory changes. Spleen: Normal in size without focal abnormality. Adrenals/Urinary Tract: Adrenal glands appear normal. Interval placement of bilateral percutaneous nephrostomies. Left ureteral stent is in grossly good position. Right ureteral stent noted on prior exam has been removed. No definite hydronephrosis or renal obstruction is seen at this time. Foley catheter is noted within the urinary bladder. Perivesical fluid collection is again noted, which currently measures 6.0 x 4.3 cm in the transverse orientation which may be slightly enlarged compared to prior exam. Fluid within urinary bladder appears to be high density suggesting possible hemorrhage. Stomach/Bowel: Status post gastric bypass. There is no evidence of bowel obstruction or inflammation. The appendix appears normal. Vascular/Lymphatic: Aortic atherosclerosis. No enlarged abdominal or pelvic lymph nodes. Reproductive: Prostate is unremarkable. Other: Mild anasarca is noted.  No definite hernia is noted. Musculoskeletal: No acute or significant osseous findings. IMPRESSION: 1. Interval placement of bilateral percutaneous nephrostomies. Left ureteral stent is in grossly good position. Right ureteral stent noted on prior exam has been removed. No definite hydronephrosis or renal obstruction is seen at this time. 2. Perivesical fluid collection is again noted, which currently measures 6.0 x 4.3 cm in the transverse orientation which may be slightly enlarged compared to prior exam. Fluid within urinary bladder appears to be high density suggesting possible hemorrhage. 3. Small bilateral pleural effusions are noted with adjacent subsegmental atelectasis. 4. Mild anasarca. 5. Aortic atherosclerosis. Aortic Atherosclerosis (ICD10-I70.0). Electronically Signed   By: Marijo Conception M.D.   On: 12/30/2019 12:47   US  PELVIS LIMITED (TRANSABDOMINAL ONLY)  Result Date: 12/30/2019 CLINICAL DATA:  Pelvic fluid collection. EXAM: LIMITED  ULTRASOUND OF PELVIS TECHNIQUE: Limited transabdominal ultrasound examination of the pelvis was performed. COMPARISON:  Noncontrast CT earlier this day. Multiple prior abdominopelvic CT. FINDINGS: Foley catheter decompresses the urinary bladder. Posterior to the catheter balloon is heterogeneous 5.3 x 3.0 x 5.2 cm avascular lesion, corresponding to that seen on CT. There is no internal or peripheral vascularity. There is a small amount of free fluid in the pelvis. IMPRESSION: 1. Heterogeneous lesion in the bladder wall posterior to the Foley catheter balloon measuring 5.3 x 3.0 x 5.2 cm. It is unclear if this represents a solid mass or walled off complex fluid collection. 2. Small amount of simple free fluid in the pelvis. Electronically Signed   By: Keith Rake M.D.   On: 12/30/2019 18:43    Assessment/Plan: 1) Colovesical fistula: Pt has known colovesical fistula. Findings from imaging yesterday consistent with findings from prior hospitalization.  He does not have a mass within the bladder (confirmed on prior cystoscopy) but rather a collection/abscess between the bladder and colon.  I agree with Dr. Clyda Greener recommendation for percutaneous drainage.  [name] to determine if bowel diversion would be beneficial prior to definitive repair of colovesical fistula in the future.  2) AKI/CKD:Renal function continues to gradually improve.  Do not suspect that this is all obstructive but certainly may be contributory. Continue nephrostomy drainage.  Left ureteral stent appears in appropriate position on CT from yesterday.  Would recommend IR to internalize a right ureteral stent at some point (not urgent) in preparation for repair of colovesical fistula to identify ureters intraoperatively but should leave nephrostomy tubes in place as well to optimize renal drainage and function.  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  Will further assess this after colovesical repair in  future.  Will continue with monthly catheter changes in the meantime.  4) Funguria: Clinically asymptomatic. Currently on fluconazole per ID recommendations considering recent invasive procedures.    LOS: 7 days   [name] 12/31/2019, 8:48 AM

## 2019-12-31 NOTE — Assessment & Plan Note (Signed)
-   resume cymbalta if renal function returns near baseline

## 2019-12-31 NOTE — Consult Note (Signed)
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 male.  He has re-accumulation of a pelvic abscess in the setting of a known colovesical fistula s/p drain placement today, 12/31/19.  In August, he had this collection drained with abscess cultures that grew a resistant E coli, PsA, and Candida.  The drain was subsequently removed in early September.  Agree with antifungal coverage given recent urologic procedures and positive cultures this admission for Candida while awaiting cultures from his pelvic abscess.  I am concerned regarding ESBL given the prior sensitivities from his abscess in August.    Recommendations: --Continue fluconazole --Start meropenem --Await drain cultures from today  Dr Baxter Flattery will be available over the weekend and here on Monday as well.  Scheduled Meds: . bisacodyl  10 mg Rectal Daily  . Chlorhexidine Gluconate Cloth  6 each Topical Daily  . feeding supplement (ENSURE ENLIVE)  237 mL Oral TID BM  . feeding supplement (NEPRO CARB STEADY)  237 mL Oral TID BM  . fentaNYL      . midazolam      . multivitamin with minerals  1 tablet Oral Daily  . nicotine  14 mg Transdermal Daily  . nutrition supplement (JUVEN)  1 packet Oral BID BM  . polyethylene glycol  34 g Oral BID  . sevelamer carbonate  800 mg Oral TID WC  . sodium bicarbonate  1,300 mg Oral BID  . sodium chloride flush  5 mL Intracatheter Q8H  . sodium chloride flush  5 mL Intracatheter Q8H  . tamsulosin  0.4 mg Oral Daily   Continuous Infusions: . lactated ringers 100 mL/hr at  12/31/19 1342   PRN Meds:.acetaminophen, HYDROcodone-acetaminophen, hydrOXYzine   HPI:  Chase [name] is a 53 y.o. male. He has a past medical hx of Hodgkins lymphoma, BPH, CKD, CVA, bladder outlet obstruction, bilateral hydronephrosis, colovesical fistula, prior drainage of pelvic fluid collection (11/15/19 with drain removal 9/3) who presented [facility]/24/19 from his [facility] due to hematuria, dysuria, nausea, fatigue, and decreased PO intake.  CT abd/pelvis on admission showed re-accumulation of pelvic fluid collection, b/l hydronephrosis and possible colovesical fistula (per urology he has a known fistula).  Creatinine was severely elevated at 7.1.  Nephrology and urology were consulted.  On 9/25 urology did left ureteral stent placement but right stent was unsuccessful.  Bilateral percutaneous nephrostomy tubes were placed by IR on 9/27.  Repeat CT on 9/30 revealed a perivesical fluid collection measuring 6.0x4.3cm.  IR placed a drain into this collection on 10/1.  He has been treated with fluconazole up to this point due to yeast being seen on urine culture prior to placement of nephrostomy tubes.  Yeast were also seen on samples sent from nephrostomy tube placement procedure.  Surgery has been following as well.  Plans for the future include repair of the colovesicular fistula.     Past Medical History:  Diagnosis Date  . Cervical radiculopathy    with left  sided weakness  . Chemical exposure    service related injury  . Chronic pain   . GIB (gastrointestinal bleeding)   . PTSD (post-traumatic stress disorder)   . Renal disorder    CKD  . Urinary retention    due to bladder outlet obstruction    Social History   Tobacco Use  . Smoking status: Never Smoker  . Smokeless tobacco: Current User  Vaping Use  . Vaping Use: Never used  Substance Use Topics  . Alcohol use: Never  . Drug use: Never    Family History  Problem Relation Age of Onset  . Renal cancer Father   . Heart disease  Paternal Grandfather     Allergies  Allergen Reactions  . Nsaids Other (See Comments)    Bleeding GI Ulceration     Review of Systems: Review of Systems  Constitutional: Negative for chills and fever.  HENT: Negative for sore throat.   Respiratory: Negative.   Cardiovascular: Negative.   Gastrointestinal: Positive for abdominal pain and nausea. Negative for constipation, diarrhea and vomiting.  Genitourinary: Negative for dysuria.  Musculoskeletal: Negative for joint pain and myalgias.  Skin: Negative for rash.  Neurological: Negative.   Psychiatric/Behavioral: Negative.   All other systems reviewed and are negative.    Objective: Blood pressure 124/70, pulse (!) 48, temperature 98 F (36.7 C), temperature source Oral, resp. rate 16, height 5\' 3"  (1.6 m), weight 61.8 kg, SpO2 98 %. Body mass index is 24.14 kg/m.  Physical Exam Constitutional:      General: He is not in acute distress.    Appearance: He is ill-appearing. He is not toxic-appearing.  HENT:     Head: Normocephalic and atraumatic.     Right Ear: External ear normal.     Left Ear: External ear normal.     Nose: Nose normal.  Eyes:     Extraocular Movements: Extraocular movements intact.     Conjunctiva/sclera: Conjunctivae normal.  Cardiovascular:     Rate and Rhythm: Normal rate and regular rhythm.  Pulmonary:     Effort: Pulmonary effort is normal. No respiratory distress.     Breath sounds: Normal breath sounds.  Abdominal:     General: There is no distension.     Palpations: Abdomen is soft.     Tenderness: There is no abdominal tenderness.     Comments: + right and left nephrostomy tube + JP drain  Neurological:     Mental Status: He is alert.  Psychiatric:        Mood and Affect: Mood normal.        Behavior: Behavior normal.      Lab Results: Lab Results  Component Value Date   WBC 8.2 12/31/2019   HGB 8.7 (L) 12/31/2019   HCT 26.9 (L) 12/31/2019   MCV 94.4 12/31/2019   PLT 215  12/31/2019    Lab Results  Component Value Date   CREATININE 6.89 (H) 12/31/2019   BUN 68 (H) 12/31/2019   NA 145 12/31/2019   K 3.5 12/31/2019   CL 113 (H) 12/31/2019   CO2 18 (L) 12/31/2019    Lab Results  Component Value Date   ALT 8 12/28/2019   AST 28 12/28/2019   ALKPHOS 87 12/28/2019     Microbiology: Recent Results (from the past 240 hour(s))  Urine culture     Status: Abnormal   Collection Time: 12/24/19 10:33 AM   Specimen: Urine, Clean Catch  Result Value Ref Range Status  Specimen Description   Final    URINE, CLEAN CATCH Performed at [facility], [place] [address], [place], [place] [address]    Special Requests   Final    NONE Performed at [facility], [place] [address], [place], [place] [address]    Culture 70,000 COLONIES/mL YEAST (A)  Final   Report Status 12/26/2019 FINAL  Final  Respiratory Panel by RT PCR (Flu A&B, Covid) - Nasopharyngeal Swab     Status: None   Collection Time: 12/24/19  2:22 PM   Specimen: Nasopharyngeal Swab  Result Value Ref Range Status   SARS Coronavirus 2 by RT PCR NEGATIVE NEGATIVE Final    Comment: (NOTE) SARS-CoV-2 target nucleic acids are NOT DETECTED.  The SARS-CoV-2 RNA is generally detectable in upper respiratoy specimens during the acute phase of infection. The lowest concentration of SARS-CoV-2 viral copies this assay can detect is 131 copies/mL. A negative result does not preclude SARS-Cov-2 infection and should not be used as the sole basis for treatment or other patient management decisions. A negative result may occur with  improper specimen collection/handling, submission of specimen other than nasopharyngeal swab, presence of viral mutation(s) within the areas targeted by this assay, and inadequate number of viral copies (<131 copies/mL). A negative result must be combined with clinical observations, patient history, and epidemiological information. The expected result is  Negative.  Fact Sheet for Patients:  PinkCheek.be  Fact Sheet for Healthcare Providers:  GravelBags.it  This test is no t yet approved or cleared by the [place] FDA and  has been authorized for detection and/or diagnosis of SARS-CoV-2 by FDA under an Emergency Use Authorization (EUA). This EUA will remain  in effect (meaning this test can be used) for the duration of the COVID-19 declaration under Section 564(b)(1) of the Act, 21 U.S.C. section 360bbb-3(b)(1), unless the authorization is terminated or revoked sooner.     Influenza A by PCR NEGATIVE NEGATIVE Final   Influenza B by PCR NEGATIVE NEGATIVE Final    Comment: (NOTE) The Xpert Xpress SARS-CoV-2/FLU/RSV assay is intended as an aid in  the diagnosis of influenza from Nasopharyngeal swab specimens and  should not be used as a sole basis for treatment. Nasal washings and  aspirates are unacceptable for Xpert Xpress SARS-CoV-2/FLU/RSV  testing.  Fact Sheet for Patients: PinkCheek.be  Fact Sheet for Healthcare Providers: GravelBags.it  This test is not yet approved or cleared by the [place] FDA and  has been authorized for detection and/or diagnosis of SARS-CoV-2 by  FDA under an Emergency Use Authorization (EUA). This EUA will remain  in effect (meaning this test can be used) for the duration of the  Covid-19 declaration under Section 564(b)(1) of the Act, 21  U.S.C. section 360bbb-3(b)(1), unless the authorization is  terminated or revoked. Performed at [facility], [place] [address], [place], [place] [address]   Aerobic/Anaerobic Culture (surgical/deep wound)     Status: None (Preliminary result)   Collection Time: 12/27/19  4:26 PM   Specimen: Abscess  Result Value Ref Range Status   Specimen Description   Final    ABSCESS LEFT KIDNEY Performed at [place] [address], [place], [place] [address]    Special Requests   Final    NONE Performed at [facility], [place] [address] [place], [place] [address]    Gram Stain   Final    MODERATE WBC PRESENT, PREDOMINANTLY MONONUCLEAR FEW YEAST Performed at [facility], [place] [address] [place], [place] [address]    Culture   Final    FEW CANDIDA ALBICANS NO ANAEROBES ISOLATED; CULTURE IN PROGRESS FOR 5 DAYS    Report Status PENDING  Incomplete  Aerobic/Anaerobic Culture (surgical/deep wound)     Status: None (Preliminary result)   Collection Time: 12/27/19  4:36 PM   Specimen: Abscess  Result Value Ref Range Status   Specimen Description   Final    ABSCESS RIGHT KIDNEY Performed at [place] [address] [place], [place] [address]    Special Requests   Final    NONE Performed at [facility], [place] [address] [place], [place] [address]    Gram Stain   Final    RARE WBC PRESENT,BOTH PMN AND MONONUCLEAR NO ORGANISMS SEEN Performed at [facility], [place] [address], [place], [place] [address]    Culture   Final    FEW CANDIDA ALBICANS NO ANAEROBES ISOLATED; CULTURE IN PROGRESS FOR 5 DAYS    Report Status PENDING  Incomplete    I have reviewed the micro and lab results.  Imaging: CT ABDOMEN PELVIS WO CONTRAST  Result Date: 12/30/2019 CLINICAL DATA:  Abdominal pain. EXAM: CT ABDOMEN AND PELVIS WITHOUT CONTRAST TECHNIQUE: Multidetector CT imaging of the abdomen and pelvis was performed following the standard protocol without IV contrast. COMPARISON:  December 24, 2019. FINDINGS: Lower chest: Small bilateral pleural effusions are noted with adjacent subsegmental atelectasis. Hepatobiliary: No focal liver abnormality is seen. Status post cholecystectomy. No biliary dilatation. Pancreas: Unremarkable. No pancreatic ductal dilatation or surrounding inflammatory changes. Spleen: Normal in size without focal  abnormality. Adrenals/Urinary Tract: Adrenal glands appear normal. Interval placement of bilateral percutaneous nephrostomies. Left ureteral stent is in grossly good position. Right ureteral stent noted on prior exam has been removed. No definite hydronephrosis or renal obstruction is seen at this time. Foley catheter is noted within the urinary bladder. Perivesical fluid collection is again noted, which currently measures 6.0 x 4.3 cm in the transverse orientation which may be slightly enlarged compared to prior exam. Fluid within urinary bladder appears to be high density suggesting possible hemorrhage. Stomach/Bowel: Status post gastric bypass. There is no evidence of bowel obstruction or inflammation. The appendix appears normal. Vascular/Lymphatic: Aortic atherosclerosis. No enlarged abdominal or pelvic lymph nodes. Reproductive: Prostate is unremarkable. Other: Mild anasarca is noted.  No definite hernia is noted. Musculoskeletal: No acute or significant osseous findings. IMPRESSION: 1. Interval placement of bilateral percutaneous nephrostomies. Left ureteral stent is in grossly good position. Right ureteral stent noted on prior exam has been removed. No definite hydronephrosis or renal obstruction is seen at this time. 2. Perivesical fluid collection is again noted, which currently measures 6.0 x 4.3 cm in the transverse orientation which may be slightly enlarged compared to prior exam. Fluid within urinary bladder appears to be high density suggesting possible hemorrhage. 3. Small bilateral pleural effusions are noted with adjacent subsegmental atelectasis. 4. Mild anasarca. 5. Aortic atherosclerosis. Aortic Atherosclerosis (ICD10-I70.0). Electronically Signed   By: Marijo Conception M.D.   On: 12/30/2019 12:47   US PELVIS LIMITED (TRANSABDOMINAL ONLY)  Result Date: 12/30/2019 CLINICAL DATA:  Pelvic fluid collection. EXAM: LIMITED ULTRASOUND OF PELVIS TECHNIQUE: Limited transabdominal ultrasound  examination of the pelvis was performed. COMPARISON:  Noncontrast CT earlier this day. Multiple prior abdominopelvic CT. FINDINGS: Foley catheter decompresses the urinary bladder. Posterior to the catheter balloon is heterogeneous 5.3 x 3.0 x 5.2 cm avascular lesion,  corresponding to that seen on CT. There is no internal or peripheral vascularity. There is a small amount of free fluid in the pelvis. IMPRESSION: 1. Heterogeneous lesion in the bladder wall posterior to the Foley catheter balloon measuring 5.3 x 3.0 x 5.2 cm. It is unclear if this represents a solid mass or walled off complex fluid collection. 2. Small amount of simple free fluid in the pelvis. Electronically Signed   By: Keith Rake M.D.   On: 12/30/2019 18:43     Imaging independently reviewed.  Discussed management plan with primary team.     Raynelle Highland for Infectious [place] [phone] pager 12/31/2019, 3:19 PM

## 2019-12-31 NOTE — Progress Notes (Signed)
Nutrition Follow-up  INTERVENTION:   -MVI with minerals -Double protein portions with meals  -D/c Juven, Nepro and Ensure  NUTRITION DIAGNOSIS:   Inadequate oral intake related to acute illness (bilateral hydronephrosis) as evidenced by meal completion < 25%.  Ongoing.  GOAL:   Patient will meet greater than or equal to 90% of their needs  Progressing.  MONITOR:   Labs, I & O's, Diet advancement, Supplement acceptance, PO intake, Weight trends  ASSESSMENT:   54 year old male with history of bladder outlet obstruction s/p ureteral stents on 9/08, cervical radiculopathy with left sided weakness, chronic pain, PTSD, CKD, and gastric bypass presented with 3 days of worsening hematuria, nausea, poor appetite, and fatigue.  9/25: s/p Cystoscopy with bilateral retrograde pyelogram, left ureteral stent placement  Patient's diet just advanced this afternoon. NPO this morning for for pelvic fluid collection.  Pt is not drinking supplements that have been ordered. RD familiar with pt from previous admission in August. Pt does not like milky supplements.  Will order double protein with meals and Boost Breeze.   Admission weight: 118 lbs. Current weight: 136 lbs.  Medications: Renvela, Lactated ringers Labs reviewed: Elevated Phos  Diet Order:   Diet Order            Diet renal with fluid restriction Fluid restriction: Other (see comments); Room service appropriate? Yes; Fluid consistency: Thin  Diet effective now                 EDUCATION NEEDS:   No education needs have been identified at this time  Skin:  Skin Assessment: Skin Integrity Issues: Skin Integrity Issues:: Stage II Stage II: mid coccyx (11/08/19)  Last BM:  10/1 -type 6  Height:   Ht Readings from Last 1 Encounters:  12/25/19 5\' 3"  (1.6 m)    Weight:   Wt Readings from Last 1 Encounters:  12/31/19 61.8 kg    Ideal Body Weight:  56.4 kg  BMI:  Body mass index is 24.14 kg/m.  Estimated  Nutritional Needs:   Kcal:  1500-1700  Protein:  75-85  Fluid:  >/= 1.5 L/day   Clayton Bibles, MS, RD, LDN Inpatient Clinical Dietitian Contact information available via Amion

## 2019-12-31 NOTE — Assessment & Plan Note (Signed)
-   continue supplements and MVI

## 2019-12-31 NOTE — Assessment & Plan Note (Addendum)
-  Bilateral nephrostomy tubes to remain in place per urology until definitive repair of colovesical fistula -Also recommendations per urology are for IR to internalize right ureteral stent in preparation for fistula repair in order to identify ureters intraoperatively -Indwelling Foley catheter to remain in place with catheter exchange every 4 to 6 weeks until definitive surgery for fistula and bladder repair

## 2019-12-31 NOTE — Assessment & Plan Note (Addendum)
Nephrology following. Baseline creatinine 2.4-2.8. Presented with creatinine of 7.1, potassium 5.5 and bicarbonate 7. CT abdomen showed bilateral severe hydronephrosis. Foley catheter in place.  -Creatinine appears to have peaked at 8.47 on 12/28/2019. Appears to be in his diuretic phase - On 9/25per Urologys/p left ureteral stent replaced but right ureteral stent unsuccessful due to tortuosity - per urology: "Would recommend IR to internalize a right ureteral stent at some point (not urgent) in preparation for repair of colovesical fistula to identify ureters intraoperatively"  - now s/p B/L PCN by IR on 9/27. Tubes to remain in place until fistula repair completed in future - Still holding off on dialysis and does not meet criteria. Appreciate nephrology assistance. Renal function has been improving daily.  -strict I&O -Diet liberalized per nephrology

## 2019-12-31 NOTE — Progress Notes (Signed)
PT Cancellation Note  Patient Details Name: Chase [name] MRN: [id number] DOB: 1965/04/30   Cancelled Treatment:     pt out of room for a procedure Placement of a 69F drainage catheter into the abscess in the rectovesicular recess via LEFT transgluteal approach.  80 mL thick purulent fluid aspirated.  Will attempt to see another day.    Rica Koyanagi  PTA Acute  Rehabilitation Services Pager      [phone] Office      [phone]

## 2019-12-31 NOTE — Progress Notes (Signed)
PROGRESS NOTE    Chase [name]   JTT:[id number]  DOB: 1965-04-29  DOA: 12/24/2019     7  PCP: [name]  CC: hematuria   Hospital Course: 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    CT abdomen on admission showed severe bilateral hydronephrosis and possible colovesical fistula. On admission creatinine 7.1 (baseline 2.4-2.8).   Admitted for acute on chronic kidney disease, severe anion gap metabolic acidosis, hyperkalemia, bilateral hydronephrosis.  Nephrology and urology consulting.  9/25, s/p left ureteral stent replaced but right ureteral stent unsuccessful.  Bilateral percutaneous nephrostomies placed by IR 12/27/19.  His creatinine peaked at 8.47 and started to show some signs of downtrending. Urine output remained adequate consistent with ATN/diuretic phase. He underwent repeat CT abdomen/pelvis on 12/30/2019 which revealed "Perivesical fluid collection is again noted, which currently measures 6.0 x 4.3 cm in the transverse orientation which may be slightly enlarged compared to prior exam".  IR was consulted for possible drain placement into fluid collection. Tentatively planned for 12/31/19.    Interval History:  No events overnight.   He declined the enema yesterday but took the miralax. Did have some BM as well.  Little more irritated again today b/c of more "procedures" however tried to explain the necessity once again. He remained aggravated but understands.   Old records reviewed in assessment of this  patient  ROS: Constitutional: negative for chills and fevers, Respiratory: negative for cough, Cardiovascular: negative for chest pain and Gastrointestinal: negative for abdominal pain  Assessment & Plan: * Acute renal failure superimposed on stage 4 chronic kidney disease [facility]) Nephrology following. Baseline creatinine 2.4-2.8. Presented with creatinine of 7.1, potassium 5.5 and bicarbonate 7. CT abdomen showed bilateral severe hydronephrosis. Foley catheter in place.  -Creatinine appears to have peaked at 8.47 on 12/28/2019. Appears to be in his diuretic phase - On 9/25per Urologys/p left ureteral stent replaced but right ureteral stent unsuccessful due to tortuosity - per urology: "Would recommend IR to internalize a right ureteral stent at some point (not urgent) in preparation for repair of colovesical fistula to identify ureters intraoperatively"  - now s/p B/L PCN by IR on 9/27. Tubes to remain in place until fistula repair completed in future - Still holding off on dialysis and does not meet criteria. Appreciate nephrology assistance -strict I&O - renal diet without fluid restriction   Pelvic abscess with colovescial fistula - greatly appreciate multi-modality input (surgery, nephrology, urology, IR) - definitive repair still being determined  - agree that he'll likely need abx again; given afebrile, no leukocytosis, would like to hold off on initiation until IR drain placement to yield higher culture results, then can start empiric treatment and await culture results - for now continue fluconazole (drain cultures from 9/27 growing C. Albicans in both right/left cultures); do not think he needs broader anti-fungal coverage unless develops further systemic signs of infection; holding off on ID consult for now as well but we can bring them on depending on IR drain culture results - if he does spike fever or significant leukocytosis, needs immediate blood cultures  Bilateral  ureteral obstruction s/p perc nephrosotmy &  stenting -Bilateral nephrostomy tubes to remain in place per urology until definitive repair of colovesical fistula -Also recommendations per urology are for IR to internalize right ureteral stent in preparation for fistula repair in order to identify ureters intraoperatively  History of CVA (cerebrovascular accident) -CVA on 10/18/2019 MRI.  Small bilateral acute cerebral and cerebellar infarcts.  Residual left hemiparesis  Electrolyte abnormality Hyperkalemia, hypokalemia, hypocalcemia, hyperphosphatemia -Treating appropriately as indicated per each - iPTH = 50 - continue sevelamer - IVF changed to LR per nephrology   UTI (urinary tract infection) - recent urological interventions, dysuria although he is afebrile and without leukocytosis. Previous urine cultures had shown E. coli and Pseudomonas. Patient was started on IV Zosyn. However urine culture now shows 70 K colonies of yeast.  - Discussed with infectious disease MD on call who recommended starting fluconazole and IV Zosyn discontinued.  BMI 24.0-24.9, adult - continue supplements and MVI  Anxiety and depression - resume cymbalta if renal function returns near baseline  ABLA (acute blood loss anemia) - see microcytic anemia  Microcytic anemia Hemoglobin has dropped from 8 g range to 6.7-6.8, confirmed on redraw.S/p 2 units PRBC and hemoglobin up to 9.7.   Increased anion gap metabolic acidosis Bicarbonate drip discontinued and started oral bicarbonate 9/27. - also on LR  ATN (acute tubular necrosis) (HCC) - see AoCKDIV  AKI (acute kidney injury) ([place]) - see AoCKDIV   Antimicrobials: Zosyn 12/25/2019>>9/27 Fluconazole 9/27>> present   DVT prophylaxis: SCD Code Status: Full Family Communication: None present Disposition Plan: Status is: Inpatient  Remains inpatient appropriate because:Ongoing diagnostic testing needed not appropriate for outpatient work up, IV  treatments appropriate due to intensity of illness or inability to take PO and Inpatient level of care appropriate due to severity of illness   Dispo: The patient is from: Home              Anticipated d/c is to: SNF              Anticipated d/c date is: > 3 days              Patient currently is not medically stable to d/c.  Objective: Blood pressure 136/63, pulse (!) 47, temperature 99 F (37.2 C), temperature source Oral, resp. rate 18, height 5\' 3"  (1.6 m), weight 61.8 kg, SpO2 98 %.  Examination: General appearance: alert, cooperative and no distress Head: Normocephalic, without obvious abnormality, atraumatic Eyes: EOMI Lungs: clear to auscultation bilaterally Heart: regular rate and rhythm and S1, S2 normal Abdomen: normal findings: bowel sounds normal and soft, non-tender and Left nephrostomy tube noted with bloody urine.  Right nephrostomy tube clear urine and Foley catheter clear urine Extremities: no edema Skin: mobility and turgor normal Neurologic: no focal deficits  Consultants:   Nephrology  Urology  Surgery  IR  Procedures:   none  Data Reviewed: I have personally reviewed following labs and imaging studies Results for orders placed or performed during the hospital encounter of 12/24/19 (from the past 24 hour(s))  CBC with Differential/Platelet     Status: Abnormal   Collection Time: 12/31/19  5:28 AM  Result Value Ref Range   WBC 8.2 4.0 - 10.5 K/uL   RBC 2.85 (L) 4.22 - 5.81 MIL/uL   Hemoglobin 8.7 (L) 13.0 - 17.0 g/dL   HCT 26.9 (L) 39 - 52 %   MCV 94.4 80.0 - 100.0 fL   MCH 30.5 26.0 - 34.0 pg   MCHC 32.3 30.0 - 36.0 g/dL  RDW 17.9 (H) 11.5 - 15.5 %   Platelets 215 150 - 400 K/uL   nRBC 0.0 0.0 - 0.2 %   Neutrophils Relative % 70 %   Neutro Abs 5.8 1.7 - 7.7 K/uL   Lymphocytes Relative 15 %   Lymphs Abs 1.3 0.7 - 4.0 K/uL   Monocytes Relative 7 %   Monocytes Absolute 0.5 0 - 1 K/uL   Eosinophils Relative 6 %   Eosinophils Absolute 0.5 0 -  0 K/uL   Basophils Relative 1 %   Basophils Absolute 0.1 0 - 0 K/uL   Immature Granulocytes 1 %   Abs Immature Granulocytes 0.05 0.00 - 0.07 K/uL  Magnesium     Status: None   Collection Time: 12/31/19  5:28 AM  Result Value Ref Range   Magnesium 2.1 1.7 - 2.4 mg/dL  Renal function panel     Status: Abnormal   Collection Time: 12/31/19  5:28 AM  Result Value Ref Range   Sodium 145 135 - 145 mmol/L   Potassium 3.5 3.5 - 5.1 mmol/L   Chloride 113 (H) 98 - 111 mmol/L   CO2 18 (L) 22 - 32 mmol/L   Glucose, Bld 92 70 - 99 mg/dL   BUN 68 (H) 6 - 20 mg/dL   Creatinine, Ser 6.89 (H) 0.61 - 1.24 mg/dL   Calcium 7.2 (L) 8.9 - 10.3 mg/dL   Phosphorus 7.4 (H) 2.5 - 4.6 mg/dL   Albumin 1.9 (L) 3.5 - 5.0 g/dL   GFR calc non Af Amer 8 (L) >60 mL/min   GFR calc Af Amer 9 (L) >60 mL/min   Anion gap 14 5 - 15    Recent Results (from the past 240 hour(s))  Urine culture     Status: Abnormal   Collection Time: 12/24/19 10:33 AM   Specimen: Urine, Clean Catch  Result Value Ref Range Status   Specimen Description   Final    URINE, CLEAN CATCH Performed at [facility], [place] [address], [place], [place] [address]    Special Requests   Final    NONE Performed at [facility], [place] [address], [place], [place] [address]    Culture 70,000 COLONIES/mL YEAST (A)  Final   Report Status 12/26/2019 FINAL  Final  Respiratory Panel by RT PCR (Flu A&B, Covid) - Nasopharyngeal Swab     Status: None   Collection Time: 12/24/19  2:22 PM   Specimen: Nasopharyngeal Swab  Result Value Ref Range Status   SARS Coronavirus 2 by RT PCR NEGATIVE NEGATIVE Final    Comment: (NOTE) SARS-CoV-2 target nucleic acids are NOT DETECTED.  The SARS-CoV-2 RNA is generally detectable in upper respiratoy specimens during the acute phase of infection. The lowest concentration of SARS-CoV-2 viral copies this assay can detect is 131 copies/mL. A negative result does not preclude  SARS-Cov-2 infection and should not be used as the sole basis for treatment or other patient management decisions. A negative result may occur with  improper specimen collection/handling, submission of specimen other than nasopharyngeal swab, presence of viral mutation(s) within the areas targeted by this assay, and inadequate number of viral copies (<131 copies/mL). A negative result must be combined with clinical observations, patient history, and epidemiological information. The expected result is Negative.  Fact Sheet for Patients:  PinkCheek.be  Fact Sheet for Healthcare Providers:  GravelBags.it  This test is no t yet approved or cleared by the [place] and  has been authorized for  detection and/or diagnosis of SARS-CoV-2 by FDA under an Emergency Use Authorization (EUA). This EUA will remain  in effect (meaning this test can be used) for the duration of the COVID-19 declaration under Section 564(b)(1) of the Act, 21 U.S.C. section 360bbb-3(b)(1), unless the authorization is terminated or revoked sooner.     Influenza A by PCR NEGATIVE NEGATIVE Final   Influenza B by PCR NEGATIVE NEGATIVE Final    Comment: (NOTE) The Xpert Xpress SARS-CoV-2/FLU/RSV assay is intended as an aid in  the diagnosis of influenza from Nasopharyngeal swab specimens and  should not be used as a sole basis for treatment. Nasal washings and  aspirates are unacceptable for Xpert Xpress SARS-CoV-2/FLU/RSV  testing.  Fact Sheet for Patients: PinkCheek.be  Fact Sheet for Healthcare Providers: GravelBags.it  This test is not yet approved or cleared by the [place] FDA and  has been authorized for detection and/or diagnosis of SARS-CoV-2 by  FDA under an Emergency Use Authorization (EUA). This EUA will remain  in effect (meaning this test can be used) for the duration of the   Covid-19 declaration under Section 564(b)(1) of the Act, 21  U.S.C. section 360bbb-3(b)(1), unless the authorization is  terminated or revoked. Performed at [facility], [place] [address] [place], [place] [address]   Aerobic/Anaerobic Culture (surgical/deep wound)     Status: None (Preliminary result)   Collection Time: 12/27/19  4:26 PM   Specimen: Abscess  Result Value Ref Range Status   Specimen Description   Final    ABSCESS LEFT KIDNEY Performed at [place] [address], [place], [place] [address]    Special Requests   Final    NONE Performed at [facility], [place] [address] [place], [place] [address]    Gram Stain   Final    MODERATE WBC PRESENT, PREDOMINANTLY MONONUCLEAR FEW YEAST Performed at [facility], [place] [address] [place], [place] [address]    Culture   Final    FEW CANDIDA ALBICANS NO ANAEROBES ISOLATED; CULTURE IN PROGRESS FOR 5 DAYS    Report Status PENDING  Incomplete  Aerobic/Anaerobic Culture (surgical/deep wound)     Status: None (Preliminary result)   Collection Time: 12/27/19  4:36 PM   Specimen: Abscess  Result Value Ref Range Status   Specimen Description   Final    ABSCESS RIGHT KIDNEY Performed at [place] [address] [place], [place] [address]    Special Requests   Final    NONE Performed at [facility], [place] [address] [place], [place] [address]    Gram Stain   Final    RARE WBC PRESENT,BOTH PMN AND MONONUCLEAR NO ORGANISMS SEEN Performed at [facility], [place] [address], [place], [place] [address]    Culture   Final    FEW CANDIDA ALBICANS NO ANAEROBES ISOLATED; CULTURE IN PROGRESS FOR 5 DAYS    Report Status PENDING  Incomplete     Radiology Studies: CT ABDOMEN PELVIS WO CONTRAST  Result Date: 12/30/2019 CLINICAL DATA:  Abdominal pain. EXAM: CT ABDOMEN AND PELVIS WITHOUT CONTRAST TECHNIQUE: Multidetector CT  imaging of the abdomen and pelvis was performed following the standard protocol without IV contrast. COMPARISON:  December 24, 2019. FINDINGS: Lower chest: Small bilateral pleural effusions are noted with adjacent subsegmental atelectasis. Hepatobiliary: No focal liver abnormality is seen. Status post cholecystectomy. No biliary dilatation. Pancreas: Unremarkable. No pancreatic ductal dilatation or surrounding inflammatory changes. Spleen: Normal  in size without focal abnormality. Adrenals/Urinary Tract: Adrenal glands appear normal. Interval placement of bilateral percutaneous nephrostomies. Left ureteral stent is in grossly good position. Right ureteral stent noted on prior exam has been removed. No definite hydronephrosis or renal obstruction is seen at this time. Foley catheter is noted within the urinary bladder. Perivesical fluid collection is again noted, which currently measures 6.0 x 4.3 cm in the transverse orientation which may be slightly enlarged compared to prior exam. Fluid within urinary bladder appears to be high density suggesting possible hemorrhage. Stomach/Bowel: Status post gastric bypass. There is no evidence of bowel obstruction or inflammation. The appendix appears normal. Vascular/Lymphatic: Aortic atherosclerosis. No enlarged abdominal or pelvic lymph nodes. Reproductive: Prostate is unremarkable. Other: Mild anasarca is noted.  No definite hernia is noted. Musculoskeletal: No acute or significant osseous findings. IMPRESSION: 1. Interval placement of bilateral percutaneous nephrostomies. Left ureteral stent is in grossly good position. Right ureteral stent noted on prior exam has been removed. No definite hydronephrosis or renal obstruction is seen at this time. 2. Perivesical fluid collection is again noted, which currently measures 6.0 x 4.3 cm in the transverse orientation which may be slightly enlarged compared to prior exam. Fluid within urinary bladder appears to be high density  suggesting possible hemorrhage. 3. Small bilateral pleural effusions are noted with adjacent subsegmental atelectasis. 4. Mild anasarca. 5. Aortic atherosclerosis. Aortic Atherosclerosis (ICD10-I70.0). Electronically Signed   By: Marijo Conception M.D.   On: 12/30/2019 12:47   US PELVIS LIMITED (TRANSABDOMINAL ONLY)  Result Date: 12/30/2019 CLINICAL DATA:  Pelvic fluid collection. EXAM: LIMITED ULTRASOUND OF PELVIS TECHNIQUE: Limited transabdominal ultrasound examination of the pelvis was performed. COMPARISON:  Noncontrast CT earlier this day. Multiple prior abdominopelvic CT. FINDINGS: Foley catheter decompresses the urinary bladder. Posterior to the catheter balloon is heterogeneous 5.3 x 3.0 x 5.2 cm avascular lesion, corresponding to that seen on CT. There is no internal or peripheral vascularity. There is a small amount of free fluid in the pelvis. IMPRESSION: 1. Heterogeneous lesion in the bladder wall posterior to the Foley catheter balloon measuring 5.3 x 3.0 x 5.2 cm. It is unclear if this represents a solid mass or walled off complex fluid collection. 2. Small amount of simple free fluid in the pelvis. Electronically Signed   By: Keith Rake M.D.   On: 12/30/2019 18:43   US PELVIS LIMITED (TRANSABDOMINAL ONLY)  Final Result    CT ABDOMEN PELVIS WO CONTRAST  Final Result    IR NEPHROSTOMY PLACEMENT RIGHT  Final Result    IR NEPHROSTOMY PLACEMENT LEFT  Final Result    US RENAL  Final Result    DG C-Arm 1-60 Min-No Report  Final Result    CT Renal Stone Study  Final Result  Addendum 1 of 1  ADDENDUM REPORT: 12/24/2019 12:35    ADDENDUM:  The possibility of a leak in the posterior urinary bladder in this  patient thought to have a fistula was again discussed with the  provider. Additionally, the presence of marked bilateral  hydronephrosis, worse on the RIGHT also was discussed. Perhaps a  cystogram prior to stented exchange if will not significantly delay  care could be  helpful for further management as clinically  warranted. This would avoid the confounding effect of contrast which  could be introduced during stent exchange. Urologic consultation was  suggested.    These results were called by telephone at the time of interpretation  on 12/24/2019 at 12:34 pm to provider [facility] ,  who verbally  acknowledged these results.      Electronically Signed    By: Zetta Bills M.D.    On: 12/24/2019 12:35      Final    CT IMAGE GUIDED DRAINAGE BY PERCUTANEOUS CATHETER    (Results Pending)    Scheduled Meds:  bisacodyl  10 mg Rectal Daily   Chlorhexidine Gluconate Cloth  6 each Topical Daily   feeding supplement (ENSURE ENLIVE)  237 mL Oral TID BM   feeding supplement (NEPRO CARB STEADY)  237 mL Oral TID BM   fentaNYL       midazolam       multivitamin with minerals  1 tablet Oral Daily   nicotine  14 mg Transdermal Daily   nutrition supplement (JUVEN)  1 packet Oral BID BM   polyethylene glycol  34 g Oral BID   potassium chloride  20 mEq Oral Once   sevelamer carbonate  800 mg Oral TID WC   sodium bicarbonate  1,300 mg Oral BID   sodium chloride flush  5 mL Intracatheter Q8H   tamsulosin  0.4 mg Oral Daily   PRN Meds: acetaminophen, HYDROcodone-acetaminophen, hydrOXYzine Continuous Infusions:  lactated ringers        LOS: 7 days  Time spent: Greater than 50% of the 35 minute visit was spent in counseling/coordination of care for the patient as laid out in the A&P.   Dwyane Dee, MD [facility] 12/31/2019, 12:34 PM  Contact via secure chat.  To contact the attending provider between 7A-7P or the covering provider during after hours 7P-7A, please log into the web site [email or web address] and access using universal [place] password for that web site. If you do not have the password, please call the hospital operator.

## 2019-12-31 NOTE — Assessment & Plan Note (Addendum)
-   greatly appreciate multi-modality input (surgery, nephrology, urology, IR, ID, GI) - definitive repair still being determined; per surgery possibly remaining inpatient until surgery can take place - s/p IR pelvic abscess drain on 10/1 (culture growing "few yeast"); ID consulted; patient now on meropenem and fluconazole (C. Albicans growing in L/R nephrostomy tube cultures and as noted now the deep pelvic culture is growing few yeast from 10/1) - if he does spike fever or significant leukocytosis, needs blood cultures - evaluated by GI on 10/2; tentative plan is for CLN the day prior to fistula repair; timing of this is still to be determined  - need to clarify with surgery if plan is inpatient repair vs awaiting further renal recovery then outpatient repair; patient also asking if his hernia could be repaired at the same time

## 2019-12-31 NOTE — Procedures (Signed)
Interventional Radiology Procedure Note  Procedure: Placement of a 53F drainage catheter into the abscess in the rectovesicular recess via LEFT transgluteal approach.  80 mL thick purulent fluid aspirated.  Sample sent for culture.   Complications: None  Estimated Blood Loss: None  Recommendations: - Drain to JP - Flush Q shift  Signed,  Criselda Peaches, MD

## 2019-12-31 NOTE — Assessment & Plan Note (Addendum)
-   recent urological interventions, dysuria although he is afebrile and without leukocytosis. Previous urine cultures had shown E. coli and Pseudomonas. Patient was started on IV Zosyn. However urine culture then showed 70 K colonies of yeast, speciated to C. Albicans - remains on fluconazole; ID now formally consulted, continue to follow recommendations - see ID note from 10/4; plan is to complete 14 day course of Fluconazole and meropenem; end date 01/14/20; if gets discharged prior to course completion needs IR to place CVL (cannot have PICC due to renal status)

## 2019-12-31 NOTE — Assessment & Plan Note (Signed)
-   see microcytic anemia

## 2019-12-31 NOTE — Assessment & Plan Note (Signed)
Bicarbonate drip discontinued and started oral bicarbonate 9/27. - also on LR

## 2019-12-31 NOTE — Assessment & Plan Note (Signed)
-   see AoCKDIV

## 2019-12-31 NOTE — Progress Notes (Addendum)
Referring Physician(s): Chase [name]  Supervising Physician: Chase [name]  Patient Status:  [name]  Chief Complaint: Flank pain, bilateral hydronephrosis, recurrent pelvic fluid collection   Subjective: Pt doing ok today; no acute changes; still with some soreness at PCN sites; no N/V or resp issues; did have BM   Allergies: Nsaids  Medications: Prior to Admission medications   Medication Sig Start Date End Date Taking? Authorizing Provider  cyanocobalamin (,VITAMIN B-12,) 1000 MCG/ML injection Inject 1,000 mcg into the muscle every 30 (thirty) days.  12/28/13  Yes [provider]  docusate sodium (COLACE) 100 MG capsule Take 1 capsule (100 mg total) by mouth 2 (two) times daily as needed for mild constipation. 11/19/19  Yes Antonieta Pert, MD  DULoxetine (CYMBALTA) 30 MG capsule Take 30-60 mg by mouth See admin instructions. Take 2 capsules in the morning and 1 capsule in the evening 08/12/17  Yes [provider]  gabapentin (NEURONTIN) 400 MG capsule Take 800 mg by mouth at bedtime.   Yes [provider]  HYDROcodone-acetaminophen (NORCO/VICODIN) 5-325 MG tablet Take 1-2 tablets by mouth every 4 (four) hours as needed for up to 10 doses for moderate pain or severe pain (1 moderate, 2 severe). 11/19/19  Yes Antonieta Pert, MD  hydrOXYzine (ATARAX/VISTARIL) 10 MG tablet Take 1 tablet (10 mg total) by mouth 3 (three) times daily as needed for itching or anxiety. 10/26/19  Yes Mikhail, Maryann, DO  midodrine (PROAMATINE) 2.5 MG tablet Take 2 tablets (5 mg total) by mouth 3 (three) times daily with meals. Patient taking differently: Take 2.5-5 mg by mouth 3 (three) times daily as needed (hypotension).  11/19/19  Yes Antonieta Pert, MD  Multiple Vitamin (MULTIVITAMIN WITH MINERALS) TABS tablet Take 1 tablet by mouth daily. 10/27/19  Yes Mikhail, Velta Addison, DO  tamsulosin (FLOMAX) 0.4 MG CAPS capsule Take 0.4 mg by mouth daily.    Yes [provider]   feeding supplement, ENSURE ENLIVE, (ENSURE ENLIVE) LIQD Take 237 mLs by mouth 3 (three) times daily between meals. Patient not taking: Reported on 12/24/2019 10/26/19   Cristal Ford, DO     Vital Signs: BP 136/63 (BP Location: Right Arm)    Pulse (!) 47    Temp 99 F (37.2 C) (Oral)    Resp 18    Ht 5\' 3"  (1.6 m)    Wt 136 lb 4.8 oz (61.8 kg)    SpO2 98%    BMI 24.14 kg/m   Physical Exam awake/alert; chest- sl dim BS bases; heart- bradycardic but reg rhythm; abd soft,+BS,NT; bilat PCN's intact, OP from L - 2.3 liters blood-tinged urine; R- 1.4 liters yellow urine; no LE edema  Imaging: CT ABDOMEN PELVIS WO CONTRAST  Result Date: 12/30/2019 CLINICAL DATA:  Abdominal pain. EXAM: CT ABDOMEN AND PELVIS WITHOUT CONTRAST TECHNIQUE: Multidetector CT imaging of the abdomen and pelvis was performed following the standard protocol without IV contrast. COMPARISON:  December 24, 2019. FINDINGS: Lower chest: Small bilateral pleural effusions are noted with adjacent subsegmental atelectasis. Hepatobiliary: No focal liver abnormality is seen. Status post cholecystectomy. No biliary dilatation. Pancreas: Unremarkable. No pancreatic ductal dilatation or surrounding inflammatory changes. Spleen: Normal in size without focal abnormality. Adrenals/Urinary Tract: Adrenal glands appear normal. Interval placement of bilateral percutaneous nephrostomies. Left ureteral stent is in grossly good position. Right ureteral stent noted on prior exam has been removed. No definite hydronephrosis or renal obstruction is seen at this time. Foley catheter is noted within the urinary bladder. Perivesical fluid collection  is again noted, which currently measures 6.0 x 4.3 cm in the transverse orientation which may be slightly enlarged compared to prior exam. Fluid within urinary bladder appears to be high density suggesting possible hemorrhage. Stomach/Bowel: Status post gastric bypass. There is no evidence of bowel obstruction or  inflammation. The appendix appears normal. Vascular/Lymphatic: Aortic atherosclerosis. No enlarged abdominal or pelvic lymph nodes. Reproductive: Prostate is unremarkable. Other: Mild anasarca is noted.  No definite hernia is noted. Musculoskeletal: No acute or significant osseous findings. IMPRESSION: 1. Interval placement of bilateral percutaneous nephrostomies. Left ureteral stent is in grossly good position. Right ureteral stent noted on prior exam has been removed. No definite hydronephrosis or renal obstruction is seen at this time. 2. Perivesical fluid collection is again noted, which currently measures 6.0 x 4.3 cm in the transverse orientation which may be slightly enlarged compared to prior exam. Fluid within urinary bladder appears to be high density suggesting possible hemorrhage. 3. Small bilateral pleural effusions are noted with adjacent subsegmental atelectasis. 4. Mild anasarca. 5. Aortic atherosclerosis. Aortic Atherosclerosis (ICD10-I70.0). Electronically Signed   By: Marijo Conception M.D.   On: 12/30/2019 12:47   US PELVIS LIMITED (TRANSABDOMINAL ONLY)  Result Date: 12/30/2019 CLINICAL DATA:  Pelvic fluid collection. EXAM: LIMITED ULTRASOUND OF PELVIS TECHNIQUE: Limited transabdominal ultrasound examination of the pelvis was performed. COMPARISON:  Noncontrast CT earlier this day. Multiple prior abdominopelvic CT. FINDINGS: Foley catheter decompresses the urinary bladder. Posterior to the catheter balloon is heterogeneous 5.3 x 3.0 x 5.2 cm avascular lesion, corresponding to that seen on CT. There is no internal or peripheral vascularity. There is a small amount of free fluid in the pelvis. IMPRESSION: 1. Heterogeneous lesion in the bladder wall posterior to the Foley catheter balloon measuring 5.3 x 3.0 x 5.2 cm. It is unclear if this represents a solid mass or walled off complex fluid collection. 2. Small amount of simple free fluid in the pelvis. Electronically Signed   By: Keith Rake  M.D.   On: 12/30/2019 18:43   IR NEPHROSTOMY PLACEMENT LEFT  Result Date: 12/27/2019 INDICATION: 54 year old male with obstructed bilateral collecting system EXAM: IR NEPHROSTOMY PLACEMENT RIGHT; IR NEPHROSTOMY PLACEMENT LEFT COMPARISON:  None. MEDICATIONS: 400 mg Cipro; The antibiotic was administered in an appropriate time frame prior to skin puncture. ANESTHESIA/SEDATION: Fentanyl 1.0 mcg IV; Versed 50 mg IV Moderate Sedation Time:  16 minutes The patient was continuously monitored during the procedure by the interventional radiology nurse under my direct supervision. CONTRAST:  6mL OMNIPAQUE IOHEXOL 300 MG/ML SOLN - administered into the collecting system(s) FLUOROSCOPY TIME:  Fluoroscopy Time: 2 minutes 0 seconds (60 mGy). COMPLICATIONS: None PROCEDURE: Informed written consent was obtained from the patient after a thorough discussion of the procedural risks, benefits and alternatives. All questions were addressed. Maximal Sterile Barrier Technique was utilized including caps, mask, sterile gowns, sterile gloves, sterile drape, hand hygiene and skin antiseptic. A timeout was performed prior to the initiation of the procedure. Patient was placed in the prone position on the fluoroscopy table. Ultrasound survey was performed with images stored and sent to PACs. The bilateral flank region prepped with Betadine, draped in usual sterile fashion, infiltrated locally with 1% lidocaine. Under real-time ultrasound guidance, a 21-gauge trocar needle was advanced into a posterior lower pole calyx of the left collecting system. Ultrasound image documentation was saved. Urine spontaneously returned through the needle. An 018 micro wire was then guided into the collecting system. A small incision was made with 11 blade scalpel, and  the needle was exchanged over a guidewire for transitional Envy system. The wire and the inner cannula were removed. Contrast injection confirmed appropriate positioning. The Accustick  catheter was then exchanged over a guidewire for an 10 French pigtail drain, formed centrally within the left renal collecting system. Contrast injection confirms appropriate positioning and patency. The drain was then secured externally with 0 Prolene suture and placed to external drain bag. The right flank region was then infiltrated locally with 1% lidocaine. Under real-time ultrasound guidance, a 21-gauge trocar needle was advanced into a posterior lower pole calyx of the right collecting system. Ultrasound image documentation was saved. Urine spontaneously returned through the needle. An 018 micro wire was then guided into the collecting system. A small incision was made with 11 blade scalpel, and the needle was exchanged over a guidewire for transitional Envy system. The wire and the inner cannula were removed. Contrast injection confirmed appropriate positioning. The 4 French catheter was then exchanged over a guidewire for an 10 French pigtail drain, formed centrally within the right renal collecting system. Contrast injection confirms appropriate positioning and patency. The drain was then secured externally with 0 Prolene suture and placed to external drain bag. Patient tolerated the procedure well and remained hemodynamically stable throughout. No complications were encountered and no significant blood loss was encountered. EBL equals 0 IMPRESSION: Status post image guided placement of bilateral percutaneous nephrostomy. Signed, Dulcy Fanny. Dellia Nims, RPVI [facility] Radiology Electronically Signed   By: Corrie Mckusick D.O.   On: 12/27/2019 16:45   IR NEPHROSTOMY PLACEMENT RIGHT  Result Date: 12/27/2019 INDICATION: 54 year old male with obstructed bilateral collecting system EXAM: IR NEPHROSTOMY PLACEMENT RIGHT; IR NEPHROSTOMY PLACEMENT LEFT COMPARISON:  None. MEDICATIONS: 400 mg Cipro; The antibiotic was administered in an appropriate time frame prior to  skin puncture. ANESTHESIA/SEDATION: Fentanyl 1.0 mcg IV; Versed 50 mg IV Moderate Sedation Time:  16 minutes The patient was continuously monitored during the procedure by the interventional radiology nurse under my direct supervision. CONTRAST:  71mL OMNIPAQUE IOHEXOL 300 MG/ML SOLN - administered into the collecting system(s) FLUOROSCOPY TIME:  Fluoroscopy Time: 2 minutes 0 seconds (60 mGy). COMPLICATIONS: None PROCEDURE: Informed written consent was obtained from the patient after a thorough discussion of the procedural risks, benefits and alternatives. All questions were addressed. Maximal Sterile Barrier Technique was utilized including caps, mask, sterile gowns, sterile gloves, sterile drape, hand hygiene and skin antiseptic. A timeout was performed prior to the initiation of the procedure. Patient was placed in the prone position on the fluoroscopy table. Ultrasound survey was performed with images stored and sent to PACs. The bilateral flank region prepped with Betadine, draped in usual sterile fashion, infiltrated locally with 1% lidocaine. Under real-time ultrasound guidance, a 21-gauge trocar needle was advanced into a posterior lower pole calyx of the left collecting system. Ultrasound image documentation was saved. Urine spontaneously returned through the needle. An 018 micro wire was then guided into the collecting system. A small incision was made with 11 blade scalpel, and the needle was exchanged over a guidewire for transitional Envy system. The wire and the inner cannula were removed. Contrast injection confirmed appropriate positioning. The Accustick catheter was then exchanged over a guidewire for an 10 French pigtail drain, formed centrally within the left renal collecting system. Contrast injection confirms appropriate positioning and patency. The drain was then secured externally with 0 Prolene suture and placed to external drain bag. The right flank region was then infiltrated locally with  1% lidocaine. Under real-time ultrasound guidance, a 21-gauge trocar needle was advanced into a posterior lower pole calyx of the right collecting system. Ultrasound image documentation was saved. Urine spontaneously returned through the needle. An 018 micro wire was then guided into the collecting system. A small incision was made with 11 blade scalpel, and the needle was exchanged over a guidewire for transitional Envy system. The wire and the inner cannula were removed. Contrast injection confirmed appropriate positioning. The 4 French catheter was then exchanged over a guidewire for an 10 French pigtail drain, formed centrally within the right renal collecting system. Contrast injection confirms appropriate positioning and patency. The drain was then secured externally with 0 Prolene suture and placed to external drain bag. Patient tolerated the procedure well and remained hemodynamically stable throughout. No complications were encountered and no significant blood loss was encountered. EBL equals 0 IMPRESSION: Status post image guided placement of bilateral percutaneous nephrostomy. Signed, Dulcy Fanny. Dellia Nims, RPVI [facility] Radiology Electronically Signed   By: Corrie Mckusick D.O.   On: 12/27/2019 16:45    Labs:  CBC: Recent Labs    12/28/19 0503 12/29/19 0412 12/30/19 0603 12/31/19 0528  WBC 7.7 9.0 6.7 8.2  HGB 9.6* 9.0* 8.7* 8.7*  HCT 28.6* 27.0* 27.5* 26.9*  PLT 205 206 208 215    COAGS: Recent Labs    10/17/19 1241 10/17/19 1241 10/17/19 1311 10/17/19 1311 11/07/19 1836 11/07/19 1853 11/10/19 1052 12/08/19 1258 12/27/19 0450  INR 1.5*   < > 1.6*   < > 1.2  --  1.3* 1.1 1.3*  APTT 43*  --  46*  --   --  31  --   --   --    < > = values in this interval not displayed.    BMP: Recent Labs    12/28/19 0503 12/29/19 0412 12/30/19 0603 12/31/19 0528  NA 138 143 142 145  K 3.7 3.8 3.3* 3.5  CL 101 107 110 113*  CO2  21* 20* 18* 18*  GLUCOSE 94 96 120* 92  BUN 98* 89* 76* 68*  CALCIUM 6.6* 6.8* 6.8* 7.2*  CREATININE 8.47* 7.98* 7.77* 6.89*  GFRNONAA 6* 7* 7* 8*  GFRAA 7* 8* 8* 9*    LIVER FUNCTION TESTS: Recent Labs    11/07/19 1836 11/07/19 1836 11/10/19 0932 11/10/19 0932 12/25/19 0217 12/28/19 0503 12/30/19 0603 12/31/19 0528  BILITOT 0.6  --  0.4  --  0.3 0.3  --   --   AST 36  --  17  --  44* 28  --   --   ALT 53*  --  41  --  76* 8  --   --   ALKPHOS 180*  --  111  --  122 87  --   --   PROT 7.5  --  5.6*  --  6.7 6.0*  --   --   ALBUMIN 2.7*   < > 1.8*   < > 2.7* 2.2* 2.0* 1.9*   < > = values in this interval not displayed.    Assessment and Plan: Patient with history of Hodgkin's lymphoma/BPH/colovesical fistula, chronic kidney disease, bladder outlet obstruction, funguria, obstructive bilateral hydronephrosis despite bilateral ureteral stents- right stent removed 9/25, status post bilateral nephrostomies on 9/27; prior drainage of pelvic fluid collection on 11/15/19 with neg fistulagram and drain removal on 9/3; now with CT A/P findings yesterday of:  1. Interval placement of bilateral percutaneous nephrostomies.  Left ureteral stent is in grossly good position. Right ureteral stent noted on prior exam has been removed. No definite hydronephrosis or renal obstruction is seen at this time. 2. Perivesical fluid collection is again noted, which currently measures 6.0 x 4.3 cm in the transverse orientation which may be slightly enlarged compared to prior exam. Fluid within urinary bladder appears to be high density suggesting possible hemorrhage. 3. Small bilateral pleural effusions are noted with adjacent subsegmental atelectasis. 4. Mild anasarca. 5. Aortic atherosclerosis.  Request received from [facility] for repeat CT guided drainage of pelvic fluid collection ; imaging has been reviewed by Dr. Laurence Ferrari; Risks and benefits discussed with the patient including bleeding, infection,  damage to adjacent structures, bowel perforation/fistula connection, and sepsis. Temp 99, WBC nl; hgb 8.7(8.7), creat 6.89(7.77), PT/INR 15.9/1.3  All of the patient's questions were answered, patient is agreeable to proceed. Consent signed and in chart.  Procedure scheduled for today  Electronically Signed: D. Rowe Robert, PA-C 12/31/2019, 10:45 AM   I spent a total of 20 minutes at the the patient's bedside AND on the patient's hospital floor or unit, greater than 50% of which was counseling/coordinating care for CT guided drainage of pelvic fluid collection/bilateral nephrostomies    Patient ID: Chase [name], male   DOB: [birth year], 54 y.o.   MRN: [id number]

## 2019-12-31 NOTE — Progress Notes (Signed)
[facility] Progress Note  Subjective:  Going for perc drainage of a fluid collection later today.  Had 3.8 liters UOP over 9/30.  He has been on NS at 125 ml/hr.  He's glad kidneys getting better even if slowly.  He asked about fluid restriction and we discussed that from my standpoint he doesn't need one  Review of systems:    Denies shortness of breath  No chest pain  No n/v Has been NPO for procedure   Vitals:   12/30/19 1227 12/30/19 2117 12/31/19 0500 12/31/19 0622  BP: 127/63 125/60  136/63  Pulse: (!) 44 (!) 50  (!) 47  Resp: _0 Temp: 98.2 F (36.8 C) 98.8 F (37.1 C)  99 F (37.2 C)  TempSrc: Oral Oral  Oral  SpO2: 100% 97%  98%  Weight:   61.8 kg   Height:        Exam: Gen adult male in bed in NAD; chronically ill appearing  HENT NCAT  Chest clear bilaterally unlabored on room air S1S2 no rub Abd soft ntnd  GU foley cath in place; neph tubes in place Ext no leg or UE edema Neuro is alert, Ox 3; provides hx and follows commands Psych normal mood and affect    Home meds:  - cymbalta 30-60 hs/ neurontin 800 hs/ norco qid prn  - midodrine 2.5- 5 tid prn  - flomax 0.4 qd  - ensure tid bm/ MVI/ atarax prn      CT renal stone study noncon >  IMPRESSION: 1. Reaccumulation of small to moderate volume of fluid above the urinary bladder in this patient with suspected defect in the urinary bladder. No adjacent colonic thickening. Study limited by lack of rectal or vesicle contrast with respect to detection of colovesical fistula though the fat plane appears reasonably well preserved between the collection in the colon on the current study. 2. Interval development of moderate to marked bilateral hydroureteronephrosis despite presence of bilateral ureteral stents. 3. Aortic atherosclerosis.    VS BP 114/ 66, HR 70  RR 18  RA 100%  Temp 98   Na 131 K 5.5  CO2 7  BUN 81  Cr 7.19  Ca 8.0  Hb 8.9  WBC 7k  plt 220   COVID negative   UA many bact  >50 rbc/ wbc, 100 prot, turbid     Assessment/ Plan: 1. AoCKD 4 - b/l creat 2.4- 2.8, eGFR 24- 43m/min. AKI presumably due to obstruction w/ new and severe bilat hydronephrosis by imaging.  Also with hypotension/ischemic and pre-renal insults. Urology did procedure 9/25 w/ L ureteral stent replaced but could not get R ureteral stent in position.  Had bilateral neph tubes with IR on 9/27.  Decreased renal reserve with CKD stage IV prior to current episode and pt states aware of his CKD since 2020 covid diagnosis at least and had hematuria a few years ago.  Last Cr 2.78 on 12/08/19 before current admission 1. Some slow, continued improvement with neph tubes and fluids 2. Change from NS at 125 ml/hr to LR at 100 ml/hr 3. 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 Hopeful for continued improvement with conservative measures 5. Renal diet without fluid restriction is ok when taking PO again  2. Bradycardia - no longer hyperkalemic and not on nodal blocking agents   3. Met acidosis - improved; on oral bicarb; change to LR 4. Hypokalemia - replete again with  20 meq PO potassium; mag is replete; replete Ca  5. Hematuria/ bilat hydronephrosis/ hx ureteral stents - tortuous ureters per the OP note by urology.  6. Pelvic abscess between bladder and colon - perc drainage today; abx per primary team   7. H/o CVA - left hemiparesis, had been at a SNF  8. Hypocalcemia - s/p repletion IV.  Improves with corr for albumin. intact PTH ok at 50 9. hyperphos - renvela with meals. Renal diet; he's on nepro as well as ensure/enlive 10. Normocytic anemia - iron deficient. Repleted with feraheme on 9/29.  S/p aranesp 40 mcg on 9/30 (one-time-order)  Disposition: Continue inpatient monitoring    Recent Labs  Lab 12/30/19 0603 12/31/19 0528  K 3.3* 3.5  BUN 76* 68*  CREATININE 7.77* 6.89*  CALCIUM 6.8* 7.2*  PHOS 7.7* 7.4*  HGB 8.7* 8.7*   Inpatient medications: .  bisacodyl  10 mg Rectal Daily  . Chlorhexidine Gluconate Cloth  6 each Topical Daily  . feeding supplement (ENSURE ENLIVE)  237 mL Oral TID BM  . feeding supplement (NEPRO CARB STEADY)  237 mL Oral TID BM  . multivitamin with minerals  1 tablet Oral Daily  . nicotine  14 mg Transdermal Daily  . nutrition supplement (JUVEN)  1 packet Oral BID BM  . polyethylene glycol  34 g Oral BID  . sevelamer carbonate  800 mg Oral TID WC  . sodium bicarbonate  1,300 mg Oral BID  . sodium chloride flush  5 mL Intracatheter Q8H  . tamsulosin  0.4 mg Oral Daily   . sodium chloride 125 mL/hr at 12/31/19 0546   acetaminophen, HYDROcodone-acetaminophen, hydrOXYzine   Claudia Desanctis, MD 12/31/2019  10:54 AM

## 2019-12-31 NOTE — Assessment & Plan Note (Signed)
-  CVA on 10/18/2019 MRI.  Small bilateral acute cerebral and cerebellar infarcts.  Residual left hemiparesis

## 2019-12-31 NOTE — Assessment & Plan Note (Addendum)
-   has required 2 units PRBC so far; Hgb still slowly downtrending - continue to monitor and will transfuse further as needed

## 2019-12-31 NOTE — Assessment & Plan Note (Signed)
Hyperkalemia, hypokalemia, hypocalcemia, hyperphosphatemia -Treating appropriately as indicated per each - iPTH = 50 - continue sevelamer - IVF changed to LR per nephrology

## 2020-01-01 LAB — CBC WITH DIFFERENTIAL/PLATELET
Abs Immature Granulocytes: 0.05 10*3/uL (ref 0.00–0.07)
Basophils Absolute: 0.1 10*3/uL (ref 0.0–0.1)
Basophils Relative: 1 %
Eosinophils Absolute: 0.5 10*3/uL (ref 0.0–0.5)
Eosinophils Relative: 6 %
HCT: 27.1 % — ABNORMAL LOW (ref 39.0–52.0)
Hemoglobin: 8.5 g/dL — ABNORMAL LOW (ref 13.0–17.0)
Immature Granulocytes: 1 %
Lymphocytes Relative: 16 %
Lymphs Abs: 1.5 10*3/uL (ref 0.7–4.0)
MCH: 30 pg (ref 26.0–34.0)
MCHC: 31.4 g/dL (ref 30.0–36.0)
MCV: 95.8 fL (ref 80.0–100.0)
Monocytes Absolute: 0.6 10*3/uL (ref 0.1–1.0)
Monocytes Relative: 6 %
Neutro Abs: 6.9 10*3/uL (ref 1.7–7.7)
Neutrophils Relative %: 70 %
Platelets: 188 10*3/uL (ref 150–400)
RBC: 2.83 MIL/uL — ABNORMAL LOW (ref 4.22–5.81)
RDW: 17.7 % — ABNORMAL HIGH (ref 11.5–15.5)
WBC: 9.7 10*3/uL (ref 4.0–10.5)
nRBC: 0 % (ref 0.0–0.2)

## 2020-01-01 LAB — AEROBIC/ANAEROBIC CULTURE W GRAM STAIN (SURGICAL/DEEP WOUND)

## 2020-01-01 LAB — RENAL FUNCTION PANEL
Albumin: 2 g/dL — ABNORMAL LOW (ref 3.5–5.0)
Anion gap: 9 (ref 5–15)
BUN: 60 mg/dL — ABNORMAL HIGH (ref 6–20)
CO2: 18 mmol/L — ABNORMAL LOW (ref 22–32)
Calcium: 7.3 mg/dL — ABNORMAL LOW (ref 8.9–10.3)
Chloride: 116 mmol/L — ABNORMAL HIGH (ref 98–111)
Creatinine, Ser: 6.54 mg/dL — ABNORMAL HIGH (ref 0.61–1.24)
GFR calc Af Amer: 10 mL/min — ABNORMAL LOW (ref >60)
GFR calc non Af Amer: 9 mL/min — ABNORMAL LOW (ref >60)
Glucose, Bld: 73 mg/dL (ref 70–99)
Phosphorus: 6.2 mg/dL — ABNORMAL HIGH (ref 2.5–4.6)
Potassium: 3.5 mmol/L (ref 3.5–5.1)
Sodium: 143 mmol/L (ref 135–145)

## 2020-01-01 LAB — MAGNESIUM: Magnesium: 2.2 mg/dL (ref 1.7–2.4)

## 2020-01-01 MED ORDER — POTASSIUM CHLORIDE CRYS ER 20 MEQ PO TBCR
20.0000 meq | EXTENDED_RELEASE_TABLET | Freq: Once | ORAL | Status: AC
  Administered 2020-01-01: 20 meq via ORAL
  Filled 2020-01-01: qty 1

## 2020-01-01 MED ORDER — POLYETHYLENE GLYCOL 3350 17 G PO PACK
51.0000 g | PACK | Freq: Three times a day (TID) | ORAL | Status: AC
  Administered 2020-01-01 (×2): 51 g via ORAL
  Filled 2020-01-01: qty 3

## 2020-01-01 MED ORDER — GABAPENTIN 100 MG PO CAPS
200.0000 mg | ORAL_CAPSULE | Freq: Every day | ORAL | Status: DC
  Administered 2020-01-01 – 2020-01-08 (×8): 200 mg via ORAL
  Filled 2020-01-01 (×8): qty 2

## 2020-01-01 NOTE — Plan of Care (Signed)

## 2020-01-01 NOTE — Consult Note (Signed)
Referring Provider:  [facility] Primary Care Physician:  [name] Primary Gastroenterologist: None (unassigned) (has been scoped at the [facility] clinic in the past)  Reason for Consultation: Preoperative colonoscopy  HPI: Chase [name] is a 54 y.o. male with a colovesical fistula awaiting surgery by Dr. Neysa Bonito.    In the meantime, he developed obstructive renal failure superimposed on chronic kidney disease, being admitted to the hospital about a week ago and having a left ureteral stent and a right percutaneous nephrostomy with subsequent internalization of a right-sided stent.  Current creatinine is 6.5, as compared to his peak creatinine of 8.5 as of 5 days ago.   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  The patient does not have any significant ongoing lower GI tract problems, reporting 1 bowel movement per day, no rectal bleeding, no diarrhea.  He has no known history of colonic disease such as diverticular disease (no diverticulosis was mentioned on the CT report) or inflammatory bowel disease.  The patient does not recall when his most recent colonoscopy was, but he thinks it may have been on the order of 5 to 8 years ago.   Past Medical History:  Diagnosis Date  . Cervical radiculopathy    with left sided weakness  . Chemical exposure    service related injury  . Chronic pain   . GIB (gastrointestinal bleeding)   . PTSD (post-traumatic stress disorder)   . Renal disorder    CKD  . Urinary retention    due to bladder outlet obstruction    Past Surgical History:  Procedure Laterality Date  . CYSTOSCOPY W/ URETERAL STENT PLACEMENT N/A 11/14/2019   Procedure: CYSTOSCOPY WITH RETROGRADE PYELOGRAM bilateral Wyvonnia Dusky STENT PLACEMENT left  fulguration bladder . cystogram;  Surgeon: Raynelle Bring, MD;  Location: [facility];  Service: Urology;  Laterality: N/A;  . CYSTOSCOPY W/ URETERAL STENT PLACEMENT Bilateral 12/25/2019   Procedure: CYSTOSCOPY WITH bilateral  RETROGRADE PYELOGRAM/ leftURETERAL STENT PLACEMENT;  Surgeon: Lucas Mallow, MD;  Location: [facility];  Service: Urology;  Laterality: Bilateral;  . GASTRIC BYPASS    . HERNIA REPAIR    . IR NEPHROSTOMY PLACEMENT LEFT  12/27/2019  . IR NEPHROSTOMY PLACEMENT RIGHT  11/16/2019  . IR NEPHROSTOMY PLACEMENT RIGHT  12/27/2019  . IR SINUS/FIST TUBE CHK-NON GI  12/03/2019  . IR URETERAL STENT PLACEMENT EXISTING ACCESS RIGHT  12/08/2019    Prior to Admission medications   Medication Sig Start Date End Date Taking? Authorizing Provider  cyanocobalamin (,VITAMIN B-12,) 1000 MCG/ML injection Inject 1,000 mcg into the muscle every 30 (thirty) days.  12/28/13  Yes [provider]  docusate sodium (COLACE) 100 MG capsule Take 1 capsule (100 mg total) by mouth 2 (two) times daily as needed for mild constipation. 11/19/19  Yes Antonieta Pert, MD  DULoxetine (CYMBALTA) 30 MG capsule Take 30-60 mg by mouth See admin instructions. Take 2 capsules in the morning and 1 capsule in the evening 08/12/17  Yes [provider]  gabapentin (NEURONTIN) 400 MG capsule Take 800 mg by mouth at bedtime.   Yes [provider]  HYDROcodone-acetaminophen (NORCO/VICODIN) 5-325 MG tablet Take 1-2 tablets by mouth every 4 (four) hours as needed for up to 10 doses for moderate pain or severe pain (1  moderate, 2 severe). 11/19/19  Yes Antonieta Pert, MD  hydrOXYzine (ATARAX/VISTARIL) 10 MG tablet Take 1 tablet (10 mg total) by mouth 3 (three) times daily as needed for itching or anxiety. 10/26/19  Yes Mikhail, Maryann, DO  midodrine (PROAMATINE) 2.5 MG tablet Take 2 tablets (5 mg total) by mouth 3 (three) times daily with meals. Patient taking differently: Take 2.5-5 mg by mouth 3 (three) times daily as needed  (hypotension).  11/19/19  Yes Antonieta Pert, MD  Multiple Vitamin (MULTIVITAMIN WITH MINERALS) TABS tablet Take 1 tablet by mouth daily. 10/27/19  Yes Mikhail, Velta Addison, DO  tamsulosin (FLOMAX) 0.4 MG CAPS capsule Take 0.4 mg by mouth daily.    Yes [provider]  feeding supplement, ENSURE ENLIVE, (ENSURE ENLIVE) LIQD Take 237 mLs by mouth 3 (three) times daily between meals. Patient not taking: Reported on 12/24/2019 10/26/19   Cristal Ford, DO    Current Facility-Administered Medications  Medication Dose Route Frequency Provider Last Rate Last Admin  . acetaminophen (TYLENOL) tablet 500-1,000 mg  500-1,000 mg Oral Q6H PRN Dwyane Dee, MD      . bisacodyl (DULCOLAX) suppository 10 mg  10 mg Rectal Daily Michael Boston, MD      . Chlorhexidine Gluconate Cloth 2 % PADS 6 each  6 each Topical Daily Marton Redwood III, MD   6 each at 12/31/19 0915  . feeding supplement (BOOST / RESOURCE BREEZE) liquid 1 Container  1 Container Oral TID BM Dwyane Dee, MD      . fluconazole (DIFLUCAN) tablet 200 mg  200 mg Oral Daily Mignon Pine, DO   200 mg at 01/01/20 7416  . gabapentin (NEURONTIN) capsule 200 mg  200 mg Oral Daily Dwyane Dee, MD      . HYDROcodone-acetaminophen (NORCO/VICODIN) 5-325 MG per tablet 1-2 tablet  1-2 tablet Oral Q4H PRN Cherylann Ratel A, DO   2 tablet at 01/01/20 1250  . hydrOXYzine (ATARAX/VISTARIL) tablet 10 mg  10 mg Oral TID PRN Modena Jansky, MD   10 mg at 01/01/20 0818  . lactated ringers infusion   Intravenous Continuous Claudia Desanctis, MD 100 mL/hr at 01/01/20 0158 New Bag at 01/01/20 0158  . meropenem (MERREM) 500 mg in sodium chloride 0.9 % 100 mL IVPB  500 mg Intravenous Q12H Mignon Pine, DO 200 mL/hr at 01/01/20 0840 500 mg at 01/01/20 0840  . multivitamin with minerals tablet 1 tablet  1 tablet Oral Daily Kyle, Tyrone A, DO   1 tablet at 01/01/20 0828  . nicotine (NICODERM CQ - dosed in mg/24 hours) patch 14 mg  14 mg Transdermal Daily Kyle,  Tyrone A, DO      . ondansetron (ZOFRAN) tablet 4 mg  4 mg Oral Q8H PRN Dwyane Dee, MD      . polyethylene glycol (MIRALAX / GLYCOLAX) packet 51 g  51 g Oral TID Michael Boston, MD   51 g at 01/01/20 0825  . sevelamer carbonate (RENVELA) tablet 800 mg  800 mg Oral TID WC Claudia Desanctis, MD   800 mg at 01/01/20 1446  . sodium bicarbonate tablet 1,300 mg  1,300 mg Oral BID Claudia Desanctis, MD   1,300 mg at 01/01/20 3845  . sodium chloride flush (NS) 0.9 % injection 5 mL  5 mL Intracatheter Q8H Wagner, Jaime, DO   5 mL at 01/01/20 1453  . sodium chloride flush (NS) 0.9 % injection 5 mL  5 mL Intracatheter Q8H Jacqulynn Cadet, MD   5  mL at 01/01/20 1458  . tamsulosin (FLOMAX) capsule 0.4 mg  0.4 mg Oral Daily Kyle, Tyrone A, DO   0.4 mg at 01/01/20 0830    Allergies as of 12/24/2019 - Review Complete 12/24/2019  Allergen Reaction Noted  . Nsaids Other (See Comments) 05/19/2013    Family History  Problem Relation Age of Onset  . Renal cancer Father   . Heart disease Paternal Grandfather     Social History   Socioeconomic History  . Marital status: Widowed    Spouse name: Not on file  . Number of children: Not on file  . Years of education: Not on file  . Highest education level: Not on file  Occupational History  . Not on file  Tobacco Use  . Smoking status: Never Smoker  . Smokeless tobacco: Current User  Vaping Use  . Vaping Use: Never used  Substance and Sexual Activity  . Alcohol use: Never  . Drug use: Never  . Sexual activity: Not on file  Other Topics Concern  . Not on file  Social History Narrative  . Not on file   Social Determinants of Health   Financial Resource Strain:   . Difficulty of Paying Living Expenses: Not on file  Food Insecurity:   . Worried About Charity fundraiser in the Last Year: Not on file  . Ran Out of Food in the Last Year: Not on file  Transportation Needs:   . Lack of Transportation (Medical): Not on file  . Lack of Transportation  (Non-Medical): Not on file  Physical Activity:   . Days of Exercise per Week: Not on file  . Minutes of Exercise per Session: Not on file  Stress:   . Feeling of Stress : Not on file  Social Connections:   . Frequency of Communication with Friends and Family: Not on file  . Frequency of Social Gatherings with Friends and Family: Not on file  . Attends Religious Services: Not on file  . Active Member of Clubs or Organizations: Not on file  . Attends Archivist Meetings: Not on file  . Marital Status: Not on file  Intimate Partner Violence:   . Fear of Current or Ex-Partner: Not on file  . Emotionally Abused: Not on file  . Physically Abused: Not on file  . Sexually Abused: Not on file   Review of systems: No heartburn or dysphagia, no constipation, diarrhea, or rectal bleeding; no chest pain, or trouble breathing.  No unusual joint pains or skin rashes.  Physical Exam: Vital signs in last 24 hours: Temp:  [98.4 F (36.9 C)-98.8 F (37.1 C)] 98.5 F (36.9 C) (10/02 1506) Pulse Rate:  [46-58] 51 (10/02 1506) Resp:  [10-15] 11 (10/02 1506) BP: (120-140)/(65-75) 120/65 (10/02 1506) SpO2:  [98 %-100 %] 100 % (10/02 1506) Last BM Date: 12/31/19  A thin, rather quiet, somewhat withdrawn Caucasian male with many tattoos, current weight about 135 pounds, no acute distress.  Chest clear, heart normal.  Abdomen has normal active bowel sounds, fairly muscular abdominal wall, no mass-effect or tenderness.  No peripheral edema.   Intake/Output from previous day: 10/01 0701 - 10/02 0700 In: 1311.2 [I.V.:1311.2] Out: 2647 [Urine:2575; Drains:72] Intake/Output this shift: Total I/O In: -  Out: 1175 [Urine:1175]  Lab Results: Recent Labs    12/30/19 0603 12/31/19 0528 01/01/20 0430  WBC 6.7 8.2 9.7  HGB 8.7* 8.7* 8.5*  HCT 27.5* 26.9* 27.1*  PLT 208 215 188   BMET Recent  Labs    12/30/19 0603 12/31/19 0528 01/01/20 0430  NA 142 145 143  K 3.3* 3.5 3.5  CL 110  113* 116*  CO2 18* 18* 18*  GLUCOSE 120* 92 73  BUN 76* 68* 60*  CREATININE 7.77* 6.89* 6.54*  CALCIUM 6.8* 7.2* 7.3*   LFT Recent Labs    01/01/20 0430  ALBUMIN 2.0*   PT/INR No results for input(s): LABPROT, INR in the last 72 hours.  Studies/Results: US PELVIS LIMITED (TRANSABDOMINAL ONLY)  Result Date: 12/30/2019 CLINICAL DATA:  Pelvic fluid collection. EXAM: LIMITED ULTRASOUND OF PELVIS TECHNIQUE: Limited transabdominal ultrasound examination of the pelvis was performed. COMPARISON:  Noncontrast CT earlier this day. Multiple prior abdominopelvic CT. FINDINGS: Foley catheter decompresses the urinary bladder. Posterior to the catheter balloon is heterogeneous 5.3 x 3.0 x 5.2 cm avascular lesion, corresponding to that seen on CT. There is no internal or peripheral vascularity. There is a small amount of free fluid in the pelvis. IMPRESSION: 1. Heterogeneous lesion in the bladder wall posterior to the Foley catheter balloon measuring 5.3 x 3.0 x 5.2 cm. It is unclear if this represents a solid mass or walled off complex fluid collection. 2. Small amount of simple free fluid in the pelvis. Electronically Signed   By: Keith Rake M.D.   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 EXAM: CT-guided drain placement MEDICATIONS: The patient is currently admitted to the hospital and receiving intravenous antibiotics. The antibiotics were administered within an appropriate time frame prior to the initiation of the procedure. ANESTHESIA/SEDATION: Fentanyl 100 mcg IV; Versed 2 mg IV Moderate Sedation Time:  21 minutes The patient was continuously monitored during the procedure by the interventional radiology nurse under my direct supervision. COMPLICATIONS: None immediate. PROCEDURE: Informed written consent was obtained from  the patient after a thorough discussion of the procedural risks, benefits and alternatives. All questions were addressed. Maximal Sterile Barrier Technique was utilized including caps, mask, sterile gowns, sterile gloves, sterile drape, hand hygiene and skin antiseptic. A timeout was performed prior to the initiation of the procedure. A planning axial CT scan was performed. The fluid collection in the retro vesicular space was successfully identified. A suitable skin entry site was selected to allow for a left transgluteal/parasacral approach. The overlying skin was sterilely prepped and draped in the standard fashion using chlorhexidine skin prep. Local anesthesia was attained by infiltration with 1% lidocaine. A small dermatotomy was made. Under intermittent CT guidance, an 18 gauge trocar needle was carefully advanced adjacent to the rectum and into the suspected fluid collection. A 0.35 wire was coiled in the fluid collection. The needle was exchanged for a fascial dilator and the soft tissue tract was dilated to 12 [place]. A Cook 12 [place] all-purpose drainage catheter was then advanced over the wire and formed in the fluid collection. Aspiration yields approximately 80 mL of thick, purulent and foul-smelling fluid. A sample was sent for Gram stain and culture. The drainage catheter was flushed and connected to JP bulb suction before being secured to the skin with 0 Prolene suture. The patient tolerated the procedure well. IMPRESSION: Successful placement of a left transgluteal approach 12 French drainage catheter into the retro vesicular fluid collection. Aspiration yields thick, foul-smelling purulent fluid. PLAN: 1. Maintain drain to JP bulb suction. 2. Flush drainage catheter daily. 3. Drainage catheter will likely remain in  place until the time of definitive surgery. Electronically Signed   By: Jacqulynn Cadet M.D.   On: 12/31/2019 16:23    Impression: 1.  Colovesical fistula without obvious  underlying bowel disease  2.  Status post gastric bypass surgery for obesity  3.  Multiple medical issues including acute on chronic renal failure, recent CVA with residual left hemiparesis  Plan: I do think that updated colonoscopic evaluation is reasonable.  I would probably favor complete colonoscopy over sigmoidoscopy, for updated screening purposes as well as to rule out pathology which may contribute to the colovesical fistula.  If technically unable to get all the way around the colon, examination of the rectosigmoid would presumably be sufficient for the purposes of preoperative evaluation.  I have reviewed the nature, purpose, and risks of colonoscopy with the patient and he is agreeable.  I think he would be able to take the prep okay with respect to his previous gastric bypass surgery, since he indicates he is able to eat fairly frequently, even though he cannot "gorge" himself.  However, he would need a bedside commode and perhaps some extra assistance in transferring from the bed to the bedside commode in view of his previous CVA.  Concerning the timing of his exam, ideally this would be done the day before his surgery, so that 1 prep would suffice for both procedures.  Also, if the colonoscopy were to disrupt a fistulous opening or otherwise cause problems in the GI tract, the patient would already be on track for surgery anyway.  Therefore, we will be on standby and would appreciate notification a couple of days in advance of the patient's anticipated surgery, so that the colonoscopy prep and scheduling can be arranged.  In the meantime, we will be on standby but will not plan to follow the patient on a regular basis.  Please call us at any time, however, if you would like to discuss this patient's case.   LOS: 8 days   Youlanda Mighty Jazper Nikolai  01/01/2020, 4:29 PM   Pager [phone] If no answer or after 5 PM call [phone]

## 2020-01-01 NOTE — Progress Notes (Signed)
[facility] Progress Note  Subjective:  Had IR drain placement to pelvic abscess on 10/1.  Had 2.6 liters UOP over 10/1.  He had been on LR at 100 ml/hr -appears was paused for abx temporarily  Review of systems:    Denies shortness of breath  No chest pain  No n/v Eating ok - [place] for lunch  Vitals:   12/31/19 2151 01/01/20 0151 01/01/20 0602 01/01/20 1053  BP: 140/66 123/73 131/69 132/73  Pulse: (!) 48 (!) 51 (!) 48 (!) 58  Resp: _0 Temp:  98.5 F (36.9 C) 98.8 F (37.1 C) 98.6 F (37 C)  TempSrc:   Oral Oral  SpO2: 100% 100% 98% 100%  Weight:      Height:        Exam: Gen adult male in bed in NAD; chronically ill appearing   HENT NCAT  Chest clear bilaterally unlabored on room air S1S2 no rub Abd soft ntnd  GU foley cath in place; neph tubes in place Ext no lower extremity edema appreciated Neuro is alert, Ox 3; provides hx and follows commands Psych normal mood and affect    Home meds:  - cymbalta 30-60 hs/ neurontin 800 hs/ norco qid prn  - midodrine 2.5- 5 tid prn  - flomax 0.4 qd  - ensure tid bm/ MVI/ atarax prn      CT renal stone study noncon >  IMPRESSION: 1. Reaccumulation of small to moderate volume of fluid above the urinary bladder in this patient with suspected defect in the urinary bladder. No adjacent colonic thickening. Study limited by lack of rectal or vesicle contrast with respect to detection of colovesical fistula though the fat plane appears reasonably well preserved between the collection in the colon on the current study. 2. Interval development of moderate to marked bilateral hydroureteronephrosis despite presence of bilateral ureteral stents. 3. Aortic atherosclerosis.    VS BP 114/ 66, HR 70  RR 18  RA 100%  Temp 98   Na 131 K 5.5  CO2 7  BUN 81  Cr 7.19  Ca 8.0  Hb 8.9  WBC 7k  plt 220   COVID negative   UA many bact >50 rbc/ wbc, 100 prot, turbid     Assessment/ Plan: 1. AoCKD 4 - b/l creat 2.4-  2.8, eGFR 24- 71m/min. AKI presumably due to obstruction w/ new and severe bilat hydronephrosis by imaging.  Also with hypotension/ischemic and pre-renal insults. Urology did procedure 9/25 w/ L ureteral stent replaced but could not get R ureteral stent in position.  Had bilateral neph tubes with IR on 9/27.  Decreased renal reserve with CKD stage IV prior to current episode and pt states aware of his CKD since 2020 covid diagnosis at least and had hematuria a few years ago.  Last Cr 2.78 on 12/08/19 before current admission 1. Some slow, continued improvement with neph tubes and fluids 2. Continue LR at 100 ml/hr 3. 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 Hopeful for continued improvement with conservative measures  2. Bradycardia - improving; no longer hyperkalemic and not on nodal blocking agents   3. Met acidosis - improved; on oral bicarb and LR 4. Hypokalemia - mag is replete; replete Ca; on LR 5. Hematuria/ bilat hydronephrosis/ hx ureteral stents - tortuous ureters per the OP note by urology.  6. Pelvic abscess between bladder and colon - perc drainage today; abx per primary team  7. H/o CVA - left hemiparesis, had been at a SNF  8. Hypocalcemia - s/p repletion IV.  Improves with corr for albumin. intact PTH ok at 50 9. hyperphos - renvela with meals. Renal diet; he's on nepro as well as ensure/enlive 10. Normocytic anemia - iron deficient. Repleted with feraheme on 9/29.  S/p aranesp 40 mcg on 9/30 (one-time-order).  Anticipate may need PRBC's soon.  Disposition: Continue inpatient monitoring    Recent Labs  Lab 12/31/19 0528 01/01/20 0430  K 3.5 3.5  BUN 68* 60*  CREATININE 6.89* 6.54*  CALCIUM 7.2* 7.3*  PHOS 7.4* 6.2*  HGB 8.7* 8.5*   Inpatient medications: . bisacodyl  10 mg Rectal Daily  . Chlorhexidine Gluconate Cloth  6 each Topical Daily  . feeding supplement  1 Container Oral TID BM  . fluconazole  200 mg Oral Daily  .  multivitamin with minerals  1 tablet Oral Daily  . nicotine  14 mg Transdermal Daily  . polyethylene glycol  51 g Oral TID  . sevelamer carbonate  800 mg Oral TID WC  . sodium bicarbonate  1,300 mg Oral BID  . sodium chloride flush  5 mL Intracatheter Q8H  . sodium chloride flush  5 mL Intracatheter Q8H  . tamsulosin  0.4 mg Oral Daily   . lactated ringers 100 mL/hr at 01/01/20 0158  . meropenem (MERREM) IV 500 mg (01/01/20 0840)   acetaminophen, HYDROcodone-acetaminophen, hydrOXYzine, ondansetron   Claudia Desanctis, MD 01/01/2020  1:56 PM

## 2020-01-01 NOTE — Progress Notes (Signed)
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    CT abdomen on admission showed severe bilateral hydronephrosis and possible colovesical fistula. On admission creatinine 7.1 (baseline 2.4-2.8).   Admitted for acute on chronic kidney disease, severe anion gap metabolic acidosis, hyperkalemia, bilateral hydronephrosis.  Nephrology and urology consulting.  9/25, s/p left ureteral stent replaced but right ureteral stent unsuccessful.  Bilateral percutaneous nephrostomies placed by IR 12/27/19.  His creatinine peaked at 8.47 and started to show some signs of downtrending. Urine output remained adequate consistent with ATN/diuretic phase. He underwent repeat CT abdomen/pelvis on 12/30/2019 which revealed "Perivesical fluid collection is again noted, which currently measures 6.0 x 4.3 cm in the transverse orientation which may be slightly enlarged compared to prior exam".  IR was consulted for possible drain placement into fluid collection.  He underwent successful drain placement on 12/31/2019 and cultures were sent for testing. Infectious disease was also consulted and patient was started on meropenem and continued with fluconazole while cultures further matured.   Interval History:  No events overnight.   Underwent IR drain placement to pelvic abscess yesterday and did well.  Informed him of renal function today  (continuing to downtrend slowly). Otherwise he understands ongoing plan for watching renal function and awaiting fistula repair.   Old records reviewed in assessment of this patient  ROS: Constitutional: negative for chills and fevers, Respiratory: negative for cough, Cardiovascular: negative for chest pain and Gastrointestinal: negative for abdominal pain  Assessment & Plan: * Acute renal failure superimposed on stage 4 chronic kidney disease [name]) Nephrology following. Baseline creatinine 2.4-2.8. Presented with creatinine of 7.1, potassium 5.5 and bicarbonate 7. CT abdomen showed bilateral severe hydronephrosis. Foley catheter in place.  -Creatinine appears to have peaked at 8.47 on 12/28/2019. Appears to be in his diuretic phase - On 9/25per Urologys/p left ureteral stent replaced but right ureteral stent unsuccessful due to tortuosity - per urology: "Would recommend IR to internalize a right ureteral stent at some point (not urgent) in preparation for repair of colovesical fistula to identify ureters intraoperatively"  - now s/p B/L PCN by IR on 9/27. Tubes to remain in place until fistula repair completed in future - Still holding off on dialysis and does not meet criteria. Appreciate nephrology assistance -strict I&O - renal diet without fluid restriction   Pelvic abscess with colovescial fistula - greatly appreciate multi-modality input (surgery, nephrology, urology, IR, ID) - definitive repair still being determined; per surgery possibly remaining inpatient until surgery can take place - s/p IR pelvic abscess drain on 10/1; follow up cultures; ID consulted; patient now on meropenem and fluconazole (C. Albicans growing in L/R nephrostomy tube cultures) - if he does spike fever or significant leukocytosis, needs blood cultures  Bilateral ureteral obstruction s/p perc nephrosotmy & stenting -Bilateral nephrostomy tubes to remain in place per urology until definitive repair of  colovesical fistula -Also recommendations per urology are for IR to internalize right ureteral stent  in preparation for fistula repair in order to identify ureters intraoperatively  History of CVA (cerebrovascular accident) -CVA on 10/18/2019 MRI.  Small bilateral acute cerebral and cerebellar infarcts.  Residual left hemiparesis  Electrolyte abnormality Hyperkalemia, hypokalemia, hypocalcemia, hyperphosphatemia -Treating appropriately as indicated per each - iPTH = 50 - continue sevelamer - IVF changed to LR per nephrology   UTI (urinary tract infection) - recent urological interventions, dysuria although he is afebrile and without leukocytosis. Previous urine cultures had shown E. coli and Pseudomonas. Patient was started on IV Zosyn. However urine culture then showed 70 K colonies of yeast, speciated to C. Albicans - remains on fluconazole; ID now formally consulted, continue to follow recommendations  BMI 24.0-24.9, adult - continue supplements and MVI  Anxiety and depression - resume cymbalta if renal function returns near baseline  ABLA (acute blood loss anemia) - see microcytic anemia  Microcytic anemia Hemoglobin has dropped from 8 g range to 6.7-6.8, confirmed on redraw.S/p 2 units PRBC and hemoglobin up to 9.7.   Increased anion gap metabolic acidosis Bicarbonate drip discontinued and started oral bicarbonate 9/27. - also on LR  ATN (acute tubular necrosis) (HCC) - see AoCKDIV  AKI (acute kidney injury) ([place]) - see AoCKDIV   Antimicrobials: Zosyn 12/25/2019>>9/27 Fluconazole 9/27>> present  Meropenem 10/1>> present   DVT prophylaxis: SCD Code Status: Full Family Communication: None present Disposition Plan: Status is: Inpatient  Remains inpatient appropriate because:Ongoing diagnostic testing needed not appropriate for outpatient work up, IV treatments appropriate due to intensity of illness or inability to take PO and Inpatient level of care  appropriate due to severity of illness   Dispo: The patient is from: Home              Anticipated d/c is to: SNF              Anticipated d/c date is: > 3 days              Patient currently is not medically stable to d/c.  Objective: Blood pressure 132/73, pulse (!) 58, temperature 98.6 F (37 C), temperature source Oral, resp. rate 12, height 5\' 3"  (1.6 m), weight 61.8 kg, SpO2 100 %.  Examination: General appearance: alert, cooperative and no distress Head: Normocephalic, without obvious abnormality, atraumatic Eyes: EOMI Lungs: clear to auscultation bilaterally Heart: regular rate and rhythm and S1, S2 normal Abdomen: normal findings: bowel sounds normal and soft, non-tender and Left nephrostomy tube noted with bloody urine.  Right nephrostomy tube clear urine and Foley catheter clear urine. Pelvic abscess bulb noted with serosanguinous fluid as well Extremities: no edema Skin: mobility and turgor normal Neurologic: no focal deficits  Consultants:   Nephrology  Urology  Surgery  IR  ID   Data Reviewed: I have personally reviewed following labs and imaging studies Results for orders placed or performed during the hospital encounter of 12/24/19 (from the past 24 hour(s))  Aerobic/Anaerobic Culture (surgical/deep wound)     Status: None (Preliminary result)   Collection Time: 12/31/19  1:06 PM   Specimen: Abscess  Result Value Ref Range   Specimen Description      ABSCESS Performed at [facility], [place] [address], [place], [place] [address]    Special Requests      Normal Performed at [facility], [place] [address] [place], [place] [address]    Gram Stain      MANY WBC PRESENT, PREDOMINANTLY PMN NO ORGANISMS SEEN    Culture  CULTURE REINCUBATED FOR BETTER GROWTH Performed at [name], [place] [address] [place], [place] [address]    Report Status PENDING   CBC with Differential/Platelet     Status: Abnormal    Collection Time: 01/01/20  4:30 AM  Result Value Ref Range   WBC 9.7 4.0 - 10.5 K/uL   RBC 2.83 (L) 4.22 - 5.81 MIL/uL   Hemoglobin 8.5 (L) 13.0 - 17.0 g/dL   HCT 27.1 (L) 39 - 52 %   MCV 95.8 80.0 - 100.0 fL   MCH 30.0 26.0 - 34.0 pg   MCHC 31.4 30.0 - 36.0 g/dL   RDW 17.7 (H) 11.5 - 15.5 %   Platelets 188 150 - 400 K/uL   nRBC 0.0 0.0 - 0.2 %   Neutrophils Relative % 70 %   Neutro Abs 6.9 1.7 - 7.7 K/uL   Lymphocytes Relative 16 %   Lymphs Abs 1.5 0.7 - 4.0 K/uL   Monocytes Relative 6 %   Monocytes Absolute 0.6 0 - 1 K/uL   Eosinophils Relative 6 %   Eosinophils Absolute 0.5 0 - 0 K/uL   Basophils Relative 1 %   Basophils Absolute 0.1 0 - 0 K/uL   Immature Granulocytes 1 %   Abs Immature Granulocytes 0.05 0.00 - 0.07 K/uL  Magnesium     Status: None   Collection Time: 01/01/20  4:30 AM  Result Value Ref Range   Magnesium 2.2 1.7 - 2.4 mg/dL  Renal function panel     Status: Abnormal   Collection Time: 01/01/20  4:30 AM  Result Value Ref Range   Sodium 143 135 - 145 mmol/L   Potassium 3.5 3.5 - 5.1 mmol/L   Chloride 116 (H) 98 - 111 mmol/L   CO2 18 (L) 22 - 32 mmol/L   Glucose, Bld 73 70 - 99 mg/dL   BUN 60 (H) 6 - 20 mg/dL   Creatinine, Ser 6.54 (H) 0.61 - 1.24 mg/dL   Calcium 7.3 (L) 8.9 - 10.3 mg/dL   Phosphorus 6.2 (H) 2.5 - 4.6 mg/dL   Albumin 2.0 (L) 3.5 - 5.0 g/dL   GFR calc non Af Amer 9 (L) >60 mL/min   GFR calc Af Amer 10 (L) >60 mL/min   Anion gap 9 5 - 15    Recent Results (from the past 240 hour(s))  Urine culture     Status: Abnormal   Collection Time: 12/24/19 10:33 AM   Specimen: Urine, Clean Catch  Result Value Ref Range Status   Specimen Description   Final    URINE, CLEAN CATCH Performed at [facility], [place] [address] [place], [place] [address]    Special Requests   Final    NONE Performed at [facility], [place] [address] [place], [place] [address]    Culture 70,000 COLONIES/mL YEAST (A)  Final    Report Status 12/26/2019 FINAL  Final  Respiratory Panel by RT PCR (Flu A&B, Covid) - Nasopharyngeal Swab     Status: None   Collection Time: 12/24/19  2:22 PM   Specimen: Nasopharyngeal Swab  Result Value Ref Range Status   SARS Coronavirus 2 by RT PCR NEGATIVE NEGATIVE Final    Comment: (NOTE) SARS-CoV-2 target nucleic acids are NOT DETECTED.  The SARS-CoV-2 RNA is generally detectable in upper respiratoy specimens during the acute phase of infection. The lowest concentration of SARS-CoV-2 viral copies this assay can detect is 131 copies/mL. A negative result does not preclude  SARS-Cov-2 infection and should not be used as the sole basis for treatment or other patient management decisions. A negative result may occur with  improper specimen collection/handling, submission of specimen other than nasopharyngeal swab, presence of viral mutation(s) within the areas targeted by this assay, and inadequate number of viral copies (<131 copies/mL). A negative result must be combined with clinical observations, patient history, and epidemiological information. The expected result is Negative.  Fact Sheet for Patients:  PinkCheek.be  Fact Sheet for Healthcare Providers:  GravelBags.it  This test is no t yet approved or cleared by the [place] FDA and  has been authorized for detection and/or diagnosis of SARS-CoV-2 by FDA under an Emergency Use Authorization (EUA). This EUA will remain  in effect (meaning this test can be used) for the duration of the COVID-19 declaration under Section 564(b)(1) of the Act, 21 U.S.C. section 360bbb-3(b)(1), unless the authorization is terminated or revoked sooner.     Influenza A by PCR NEGATIVE NEGATIVE Final   Influenza B by PCR NEGATIVE NEGATIVE Final    Comment: (NOTE) The Xpert Xpress SARS-CoV-2/FLU/RSV assay is intended as an aid in  the diagnosis of influenza from Nasopharyngeal swab  specimens and  should not be used as a sole basis for treatment. Nasal washings and  aspirates are unacceptable for Xpert Xpress SARS-CoV-2/FLU/RSV  testing.  Fact Sheet for Patients: PinkCheek.be  Fact Sheet for Healthcare Providers: GravelBags.it  This test is not yet approved or cleared by the [place] FDA and  has been authorized for detection and/or diagnosis of SARS-CoV-2 by  FDA under an Emergency Use Authorization (EUA). This EUA will remain  in effect (meaning this test can be used) for the duration of the  Covid-19 declaration under Section 564(b)(1) of the Act, 21  U.S.C. section 360bbb-3(b)(1), unless the authorization is  terminated or revoked. Performed at [facility], [place] [address], [place], [place] [address]   Aerobic/Anaerobic Culture (surgical/deep wound)     Status: None   Collection Time: 12/27/19  4:26 PM   Specimen: Abscess  Result Value Ref Range Status   Specimen Description   Final    ABSCESS LEFT KIDNEY Performed at [place] [address] [place], [place] [address]    Special Requests   Final    NONE Performed at [facility], [place] [address] [place], [place] [address]    Gram Stain   Final    MODERATE WBC PRESENT, PREDOMINANTLY MONONUCLEAR FEW YEAST    Culture   Final    FEW CANDIDA ALBICANS NO ANAEROBES ISOLATED Performed at [facility], [place] [address] [place], [place] [address]    Report Status 01/01/2020 FINAL  Final  Aerobic/Anaerobic Culture (surgical/deep wound)     Status: None (Preliminary result)   Collection Time: 12/27/19  4:36 PM   Specimen: Abscess  Result Value Ref Range Status   Specimen Description   Final    ABSCESS RIGHT KIDNEY Performed at [place] [address] [place], [place] [address]    Special Requests   Final    NONE Performed at [facility], [place] [address], [place], [place] [address]    Gram Stain   Final    RARE WBC PRESENT,BOTH PMN AND MONONUCLEAR NO ORGANISMS SEEN Performed at [facility], [place] [address] [place], [place] [address]    Culture   Final    FEW CANDIDA ALBICANS NO ANAEROBES ISOLATED;  CULTURE IN PROGRESS FOR 5 DAYS    Report Status PENDING  Incomplete  Aerobic/Anaerobic Culture (surgical/deep wound)     Status: None (Preliminary result)   Collection Time: 12/31/19  1:06 PM   Specimen: Abscess  Result Value Ref Range Status   Specimen Description   Final    ABSCESS Performed at [place] [address] [place], [place] [address]    Special Requests   Final    Normal Performed at [facility], [place] [address] [place], [place] [address]    Gram Stain   Final    MANY WBC PRESENT, PREDOMINANTLY PMN NO ORGANISMS SEEN    Culture   Final    CULTURE REINCUBATED FOR BETTER GROWTH Performed at [facility], [place] [address], [place], [place] [address]    Report Status PENDING  Incomplete     Radiology Studies: US PELVIS LIMITED (TRANSABDOMINAL ONLY)  Result Date: 12/30/2019 CLINICAL DATA:  Pelvic fluid collection. EXAM: LIMITED ULTRASOUND OF PELVIS TECHNIQUE: Limited transabdominal ultrasound examination of the pelvis was performed. COMPARISON:  Noncontrast CT earlier this day. Multiple prior abdominopelvic CT. FINDINGS: Foley catheter decompresses the urinary bladder. Posterior to the catheter balloon is heterogeneous 5.3 x 3.0 x 5.2 cm avascular lesion, corresponding to that seen on CT. There is no internal or peripheral vascularity. There is a small amount of free fluid in the pelvis. IMPRESSION: 1. Heterogeneous lesion in the bladder wall posterior to the Foley catheter balloon measuring 5.3 x 3.0 x 5.2 cm. It is unclear if this represents a solid mass or walled off complex fluid collection. 2. Small amount of simple free fluid in the pelvis.  Electronically Signed   By: Keith Rake M.D.   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 EXAM: CT-guided drain placement MEDICATIONS: The patient is currently admitted to the hospital and receiving intravenous antibiotics. The antibiotics were administered within an appropriate time frame prior to the initiation of the procedure. ANESTHESIA/SEDATION: Fentanyl 100 mcg IV; Versed 2 mg IV Moderate Sedation Time:  21 minutes The patient was continuously monitored during the procedure by the interventional radiology nurse under my direct supervision. COMPLICATIONS: None immediate. PROCEDURE: Informed written consent was obtained from the patient after a thorough discussion of the procedural risks, benefits and alternatives. All questions were addressed. Maximal Sterile Barrier Technique was utilized including caps, mask, sterile gowns, sterile gloves, sterile drape, hand hygiene and skin antiseptic. A timeout was performed prior to the initiation of the procedure. A planning axial CT scan was performed. The fluid collection in the retro vesicular space was successfully identified. A suitable skin entry site was selected to allow for a left transgluteal/parasacral approach. The overlying skin was sterilely prepped and draped in the standard fashion using chlorhexidine skin prep. Local anesthesia was attained by infiltration with 1% lidocaine. A small dermatotomy was made. Under intermittent CT guidance, an 18 gauge trocar needle was carefully advanced adjacent to the rectum and into the suspected fluid collection. A 0.35 wire was coiled in the fluid collection. The needle was exchanged for a fascial dilator and the soft tissue tract was dilated to 12 [place]. A Cook 12 [place] all-purpose drainage catheter was then advanced over the wire and  formed in the fluid collection. Aspiration yields approximately 80 mL of thick, purulent and foul-smelling fluid. A sample was sent  for Gram stain and culture. The drainage catheter was flushed and connected to JP bulb suction before being secured to the skin with 0 Prolene suture. The patient tolerated the procedure well. IMPRESSION: Successful placement of a left transgluteal approach 12 French drainage catheter into the retro vesicular fluid collection. Aspiration yields thick, foul-smelling purulent fluid. PLAN: 1. Maintain drain to JP bulb suction. 2. Flush drainage catheter daily. 3. Drainage catheter will likely remain in place until the time of definitive surgery. Electronically Signed   By: Jacqulynn Cadet M.D.   On: 12/31/2019 16:23   CT IMAGE GUIDED DRAINAGE BY PERCUTANEOUS CATHETER  Final Result    US PELVIS LIMITED (TRANSABDOMINAL ONLY)  Final Result    CT ABDOMEN PELVIS WO CONTRAST  Final Result    IR NEPHROSTOMY PLACEMENT RIGHT  Final Result    IR NEPHROSTOMY PLACEMENT LEFT  Final Result    US RENAL  Final Result    DG C-Arm 1-60 Min-No Report  Final Result    CT Renal Stone Study  Final Result  Addendum 1 of 1  ADDENDUM REPORT: 12/24/2019 12:35    ADDENDUM:  The possibility of a leak in the posterior urinary bladder in this  patient thought to have a fistula was again discussed with the  provider. Additionally, the presence of marked bilateral  hydronephrosis, worse on the RIGHT also was discussed. Perhaps a  cystogram prior to stented exchange if will not significantly delay  care could be helpful for further management as clinically  warranted. This would avoid the confounding effect of contrast which  could be introduced during stent exchange. Urologic consultation was  suggested.    These results were called by telephone at the time of interpretation  on 12/24/2019 at 12:34 pm to provider [facility] , who verbally  acknowledged these results.       Electronically Signed    By: Zetta Bills M.D.    On: 12/24/2019 12:35      Final      Scheduled Meds: . bisacodyl  10 mg Rectal Daily  . Chlorhexidine Gluconate Cloth  6 each Topical Daily  . feeding supplement  1 Container Oral TID BM  . fluconazole  200 mg Oral Daily  . multivitamin with minerals  1 tablet Oral Daily  . nicotine  14 mg Transdermal Daily  . polyethylene glycol  51 g Oral TID  . sevelamer carbonate  800 mg Oral TID WC  . sodium bicarbonate  1,300 mg Oral BID  . sodium chloride flush  5 mL Intracatheter Q8H  . sodium chloride flush  5 mL Intracatheter Q8H  . tamsulosin  0.4 mg Oral Daily   PRN Meds: acetaminophen, HYDROcodone-acetaminophen, hydrOXYzine, ondansetron Continuous Infusions: . lactated ringers 100 mL/hr at 01/01/20 0158  . meropenem (MERREM) IV 500 mg (01/01/20 0840)      LOS: 8 days  Time spent: Greater than 50% of the 35 minute visit was spent in counseling/coordination of care for the patient as laid out in the A&P.   Dwyane Dee, MD [facility] 01/01/2020, 12:56 PM  Contact via secure chat.  To contact the attending provider between 7A-7P or the covering provider during after hours 7P-7A, please log into the web site [email or web address] and access using universal [place] password for that web site. If you do not have the password, please call the hospital operator.

## 2020-01-01 NOTE — Progress Notes (Signed)
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 cx still incubating. Patient remains afebrile and on broad spectrum abtx given hx of esbl ecoli.  Plan: -continue with meropenem and fluconazole.  [name] for Infectious Diseases [phone]

## 2020-01-01 NOTE — Progress Notes (Addendum)
Chase [name] [id number] [birth year]  CARE TEAM:  PCP: [facility], [place] Team: Patient Care Team: [name] as PCP - General Raynelle Bring, MD as Consulting Physician (Urology)  Inpatient Treatment Team: Treatment Team: Attending Provider: Dwyane Dee, MD; Consulting Physician: Roney Jaffe, MD; Physician Assistant: Alric Seton, PA-C; Rounding Team: Joycelyn Das, MD; Consulting Physician: Raynelle Bring, MD; Rounding Team: D[facility], MD; Consulting Physician: Loistine Chance, MD; Consulting Physician: Michael Boston, MD; Registered Nurse: Micheline Chapman, RN; Technician: Edrick Kins, NT; Utilization Review: Conception Oms, RN; Physical Therapist: Julien Girt, PT; Registered Nurse: Meriel Pica, RN   Problem List:   Principal Problem:   Acute renal failure superimposed on stage 4 chronic kidney disease [facility]) Active Problems:   Colovesical fistula   CKD (chronic kidney disease), stage IV ([place])   Protein-calorie malnutrition, severe   Bilateral ureteral obstruction s/p perc nephrosotmy & stenting   Cerebral embolism with cerebral infarction   BPH (benign prostatic hyperplasia)   Pelvic abscess with colovescial fistula   History of gastric bypass   Pernicious anemia   AKI (acute kidney injury) ([place])   ATN (acute tubular necrosis) ([place])   UTI (urinary tract infection)   Electrolyte abnormality   Increased anion gap metabolic acidosis   Microcytic anemia   ABLA (acute blood loss anemia)   Anxiety and depression   History of CVA (cerebrovascular accident)   BMI 24.0-24.9, adult   7 Days Post-Op  56/25/2021  Procedure(s): CYSTOSCOPY WITH bilateral  RETROGRADE PYELOGRAM/ leftURETERAL STENT PLACEMENT    Assessment  Acute on severe chronic kidney disease related to hydronephrosis and ureteral obstruction most likely due to severe inflammation from 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  [facility] Stay = 8 days)  Plan:  At some point, the patient would benefit from resection the rectosigmoid colon and bladder repair of the colovesical fistula.  The tentative plan was to wait for infection to go down, renal function to improve, and rehabilitation from his stroke.  With his readmission, surgery has been set back.  However he already looks more bright and alert and hemiparesis seems to be markedly resolved since last months admission (despite the worsening ARF/CKD).  I would be inclined to keep him on antibiotics until surgery.  Continue fluconazole per infectious disease.  Meropenem for now.  Follow-up on cultures.  Drains for recurrent pelvic abscess.  Needs to stay until he gets surgery.  Therapy includes MiraLAX to help clean out his severe constipation as possible and also anticipation of colonoscopy or flexible sigmoidoscopy this hospitalization  Patient needs a colonoscopy at some point.  Even a flexible sigmoidoscopy would be helpful.  If there are no surprises on his CAT scan, consider gastroenterology consult for endoscopy and biopsies this admission since no evidence of perf.  Make sure this is not a malignancy also suspected on MRI of the pelvis.  May at outside institution.  Patient thinks he has had a colonoscopy in the past year through [place] in [place].  However he admits his memory is not great since the stroke..  That could alter treatment plan.    If it is diverticular etiology as we assume, then hopefully would plan robotic sigmoid colectomy with repair of bladder for colovesical fistula.  Ideally would wait 6 weeks after resolution of any abscess = NOVEMBER.  Will try to set up elective robotic resection.  Low threshold for him to have a Hartmann resection or diverting colostomy  if he worsens / fails to improve this admission with another operation in the future.  Another possibility would be immediate anastomosis with diverting ileostomy.   That will have to be determined on the day of surgery.  Patient's biggest concern is losing his rehab/SNF spot at [facility] since he has enjoyed working with rehab through them & recovering from his stroke & ARF/CKD with severe deconditioning.  He feels like he is made a lot of strides with PT etc, and he is worried about losing his spot sponsored by the [place] contract due to this unexpected readmission.  Would be like going/colonoscopy hospitalization.  For case management and social work to help work with him - they are following closely.  I do agree that rehab is essential for minimal morbidity/mortality.  VTE prophylaxis- SCDs, etc  Mobilize as tolerated to help recovery  56 minutes spent in review, evaluation, examination, counseling, and coordination of care.  I reviewed the CT scan and all his most recent films with Dr Dema Severin, my colorectal partner.  Discussed with Dr. Alinda Money with use more than 50% of that time was spent in counseling.  01/01/2020    Subjective: (Chief complaint)  Drain placed.  Not nauseated.  Still constipated.  Objective:  Vital signs:  Vitals:   12/31/19 1854 12/31/19 2151 01/01/20 0151 01/01/20 0602  BP: 127/75 140/66 123/73 131/69  Pulse: (!) 46 (!) 48 (!) 51 (!) 48  Resp: 10 13 15 15   Temp: 98.4 F (36.9 C)  98.5 F (36.9 C) 98.8 F (37.1 C)  TempSrc: Oral   Oral  SpO2: 99% 100% 100% 98%  Weight:      Height:        Last BM Date: 12/31/19  Intake/Output   Yesterday:  10/01 0701 - 10/02 0700 In: 1311.2 [I.V.:1311.2] Out: 2647 [Urine:2575; Drains:72] This shift:  No intake/output data recorded.  Bowel function:  Flatus: YES  BM:  YES  Drain: (No drain)   Physical Exam:  General: Pt awake/alert in no acute distress.  Looks cachectic but a little better.  Markedly more animated and alert interactive. Eyes: PERRL, normal EOM.  Sclera clear.  No icterus Neuro: CN II-XII intact w/o focal sensory/motor deficits.  Moving upper  extremities regularly easily with good handgrip strength Lymph: No head/neck/groin lymphadenopathy Psych:  No delerium/psychosis/paranoia.  Oriented x 4 HENT: Normocephalic, Mucus membranes moist.  No thrush Neck: Supple, No tracheal deviation.  No obvious thyromegaly Chest: No pain to chest wall compression.  Good respiratory excursion.  No audible wheezing CV:  Pulses intact.  Regular rhythm.  No major extremity edema MS: Normal AROM mjr joints.  No obvious deformity  Abdomen: Soft.  Nondistended.  Nontender.  No evidence of peritonitis.  No incarcerated hernias.  Percutaneous nephrostomy tubes in place.  Right clear yellow.  Left serosanguineous. Foley in place  Transgluteal drain primarily serosanguineous  Ext:  No deformity.  No mjr edema.  No cyanosis Skin: No petechiae / purpurea.  No major sores.  Warm and dry    Results:   Cultures: Recent Results (from the past 720 hour(s))  Urine culture     Status: Abnormal   Collection Time: 12/24/19 10:33 AM   Specimen: Urine, Clean Catch  Result Value Ref Range Status   Specimen Description   Final    URINE, CLEAN CATCH Performed at [facility], [place] [address] [place], [place] [address]    Special Requests   Final    NONE Performed at [facility]  [place] [address], [place], [place] [address]    Culture 70,000 COLONIES/mL YEAST (A)  Final   Report Status 12/26/2019 FINAL  Final  Respiratory Panel by RT PCR (Flu A&B, Covid) - Nasopharyngeal Swab     Status: None   Collection Time: 12/24/19  2:22 PM   Specimen: Nasopharyngeal Swab  Result Value Ref Range Status   SARS Coronavirus 2 by RT PCR NEGATIVE NEGATIVE Final    Comment: (NOTE) SARS-CoV-2 target nucleic acids are NOT DETECTED.  The SARS-CoV-2 RNA is generally detectable in upper respiratoy specimens during the acute phase of infection. The lowest concentration of SARS-CoV-2 viral copies this assay can detect is 131 copies/mL. A  negative result does not preclude SARS-Cov-2 infection and should not be used as the sole basis for treatment or other patient management decisions. A negative result may occur with  improper specimen collection/handling, submission of specimen other than nasopharyngeal swab, presence of viral mutation(s) within the areas targeted by this assay, and inadequate number of viral copies (<131 copies/mL). A negative result must be combined with clinical observations, patient history, and epidemiological information. The expected result is Negative.  Fact Sheet for Patients:  PinkCheek.be  Fact Sheet for Healthcare Providers:  GravelBags.it  This test is no t yet approved or cleared by the [place] FDA and  has been authorized for detection and/or diagnosis of SARS-CoV-2 by FDA under an Emergency Use Authorization (EUA). This EUA will remain  in effect (meaning this test can be used) for the duration of the COVID-19 declaration under Section 564(b)(1) of the Act, 21 U.S.C. section 360bbb-3(b)(1), unless the authorization is terminated or revoked sooner.     Influenza A by PCR NEGATIVE NEGATIVE Final   Influenza B by PCR NEGATIVE NEGATIVE Final    Comment: (NOTE) The Xpert Xpress SARS-CoV-2/FLU/RSV assay is intended as an aid in  the diagnosis of influenza from Nasopharyngeal swab specimens and  should not be used as a sole basis for treatment. Nasal washings and  aspirates are unacceptable for Xpert Xpress SARS-CoV-2/FLU/RSV  testing.  Fact Sheet for Patients: PinkCheek.be  Fact Sheet for Healthcare Providers: GravelBags.it  This test is not yet approved or cleared by the [place] FDA and  has been authorized for detection and/or diagnosis of SARS-CoV-2 by  FDA under an Emergency Use Authorization (EUA). This EUA will remain  in effect (meaning this test can  be used) for the duration of the  Covid-19 declaration under Section 564(b)(1) of the Act, 21  U.S.C. section 360bbb-3(b)(1), unless the authorization is  terminated or revoked. Performed at [facility], [place] [address] [place], [place] [address]   Aerobic/Anaerobic Culture (surgical/deep wound)     Status: None (Preliminary result)   Collection Time: 12/27/19  4:26 PM   Specimen: Abscess  Result Value Ref Range Status   Specimen Description   Final    ABSCESS LEFT KIDNEY Performed at [place] [address] [place], [place] [address]    Special Requests   Final    NONE Performed at [facility], [place] [address] [place], [place] [address]    Gram Stain   Final    MODERATE WBC PRESENT, PREDOMINANTLY MONONUCLEAR FEW YEAST Performed at [facility], [place] [address] [place], [place] [address]    Culture   Final    FEW CANDIDA ALBICANS NO ANAEROBES ISOLATED; CULTURE IN PROGRESS FOR 5 DAYS    Report Status PENDING  Incomplete  Aerobic/Anaerobic Culture (surgical/deep wound)     Status: None (Preliminary result)   Collection Time: 12/27/19  4:36 PM   Specimen: Abscess  Result Value Ref Range Status   Specimen Description   Final    ABSCESS RIGHT KIDNEY Performed at [place] [address] [place], [place] [address]    Special Requests   Final    NONE Performed at [facility], [place] [address] [place], [place] [address]    Gram Stain   Final    RARE WBC PRESENT,BOTH PMN AND MONONUCLEAR NO ORGANISMS SEEN Performed at [facility], [place] [address], [place], [place] [address]    Culture   Final    FEW CANDIDA ALBICANS NO ANAEROBES ISOLATED; CULTURE IN PROGRESS FOR 5 DAYS    Report Status PENDING  Incomplete  Aerobic/Anaerobic Culture (surgical/deep wound)     Status: None (Preliminary result)   Collection Time: 12/31/19  1:06 PM   Specimen: Abscess  Result  Value Ref Range Status   Specimen Description   Final    ABSCESS Performed at [place] [address] [place], [place] [address]    Special Requests   Final    Normal Performed at [facility], [place] [address] [place], [place] [address]    Gram Stain   Final    MANY WBC PRESENT, PREDOMINANTLY PMN NO ORGANISMS SEEN Performed at [facility], [place] [address], [place], [place] [address]    Culture PENDING  Incomplete   Report Status PENDING  Incomplete    Labs: Results for orders placed or performed during the hospital encounter of 12/24/19 (from the past 48 hour(s))  CBC with Differential/Platelet     Status: Abnormal   Collection Time: 12/31/19  5:28 AM  Result Value Ref Range   WBC 8.2 4.0 - 10.5 K/uL   RBC 2.85 (L) 4.22 - 5.81 MIL/uL   Hemoglobin 8.7 (L) 13.0 - 17.0 g/dL   HCT 26.9 (L) 39 - 52 %   MCV 94.4 80.0 - 100.0 fL   MCH 30.5 26.0 - 34.0 pg   MCHC 32.3 30.0 - 36.0 g/dL   RDW 17.9 (H) 11.5 - 15.5 %   Platelets 215 150 - 400 K/uL   nRBC 0.0 0.0 - 0.2 %   Neutrophils Relative % 70 %   Neutro Abs 5.8 1.7 - 7.7 K/uL   Lymphocytes Relative 15 %   Lymphs Abs 1.3 0.7 - 4.0 K/uL   Monocytes Relative 7 %   Monocytes Absolute 0.5 0 - 1 K/uL   Eosinophils Relative 6 %   Eosinophils Absolute 0.5 0 - 0 K/uL   Basophils Relative 1 %   Basophils Absolute 0.1 0 - 0 K/uL   Immature Granulocytes 1 %   Abs Immature Granulocytes 0.05 0.00 - 0.07 K/uL    Comment: Performed at [facility], [place] [address] [place], [place] [address]  Magnesium     Status: None   Collection Time: 12/31/19  5:28 AM  Result Value Ref Range   Magnesium 2.1 1.7 - 2.4 mg/dL    Comment: Performed at [facility], [place] [address] [place],  [address]  Renal function panel     Status: Abnormal   Collection Time: 12/31/19  5:28 AM  Result Value Ref Range   Sodium 145 135 - 145 mmol/L   Potassium 3.5 3.5 - 5.1  mmol/L   Chloride 113 (  H) 98 - 111 mmol/L   CO2 18 (L) 22 - 32 mmol/L   Glucose, Bld 92 70 - 99 mg/dL    Comment: Glucose reference range applies only to samples taken after fasting for at least 8 hours.   BUN 68 (H) 6 - 20 mg/dL   Creatinine, Ser 6.89 (H) 0.61 - 1.24 mg/dL   Calcium 7.2 (L) 8.9 - 10.3 mg/dL   Phosphorus 7.4 (H) 2.5 - 4.6 mg/dL   Albumin 1.9 (L) 3.5 - 5.0 g/dL   GFR calc non Af Amer 8 (L) >60 mL/min   GFR calc Af Amer 9 (L) >60 mL/min   Anion gap 14 5 - 15    Comment: Performed at [facility], [place] [address] [place], [place] [address]  Aerobic/Anaerobic Culture (surgical/deep wound)     Status: None (Preliminary result)   Collection Time: 12/31/19  1:06 PM   Specimen: Abscess  Result Value Ref Range   Specimen Description      ABSCESS Performed at [place] [address] [place], [place] [address]    Special Requests      Normal Performed at [facility], [place] [address], [place], [place] [address]    Gram Stain      MANY WBC PRESENT, PREDOMINANTLY PMN NO ORGANISMS SEEN Performed at [facility], [place] [address], [place], [place] [address]    Culture PENDING    Report Status PENDING   CBC with Differential/Platelet     Status: Abnormal   Collection Time: 01/01/20  4:30 AM  Result Value Ref Range   WBC 9.7 4.0 - 10.5 K/uL   RBC 2.83 (L) 4.22 - 5.81 MIL/uL   Hemoglobin 8.5 (L) 13.0 - 17.0 g/dL   HCT 27.1 (L) 39 - 52 %   MCV 95.8 80.0 - 100.0 fL   MCH 30.0 26.0 - 34.0 pg   MCHC 31.4 30.0 - 36.0 g/dL   RDW 17.7 (H) 11.5 - 15.5 %   Platelets 188 150 - 400 K/uL   nRBC 0.0 0.0 - 0.2 %   Neutrophils Relative % 70 %   Neutro Abs 6.9 1.7 - 7.7 K/uL   Lymphocytes Relative 16 %   Lymphs Abs 1.5 0.7 - 4.0 K/uL   Monocytes Relative 6 %   Monocytes Absolute 0.6 0 - 1 K/uL   Eosinophils Relative 6 %   Eosinophils Absolute 0.5 0 - 0 K/uL   Basophils Relative 1 %   Basophils Absolute 0.1 0 - 0  K/uL   Immature Granulocytes 1 %   Abs Immature Granulocytes 0.05 0.00 - 0.07 K/uL    Comment: Performed at [facility], [place] [address] [place], [place] [address]  Magnesium     Status: None   Collection Time: 01/01/20  4:30 AM  Result Value Ref Range   Magnesium 2.2 1.7 - 2.4 mg/dL    Comment: Performed at [facility], [place] [address], [place], [place] [address]  Renal function panel     Status: Abnormal   Collection Time: 01/01/20  4:30 AM  Result Value Ref Range   Sodium 143 135 - 145 mmol/L   Potassium 3.5 3.5 - 5.1 mmol/L   Chloride 116 (H) 98 - 111 mmol/L   CO2 18 (L) 22 - 32 mmol/L   Glucose, Bld 73 70 - 99 mg/dL    Comment: Glucose reference range applies only to samples taken after fasting for at least  8 hours.   BUN 60 (H) 6 - 20 mg/dL   Creatinine, Ser 6.54 (H) 0.61 - 1.24 mg/dL   Calcium 7.3 (L) 8.9 - 10.3 mg/dL   Phosphorus 6.2 (H) 2.5 - 4.6 mg/dL   Albumin 2.0 (L) 3.5 - 5.0 g/dL   GFR calc non Af Amer 9 (L) >60 mL/min   GFR calc Af Amer 10 (L) >60 mL/min   Anion gap 9 5 - 15    Comment: Performed at [facility], [place] [address] [place], [place] [address]    Imaging / Studies: CT ABDOMEN PELVIS WO CONTRAST  Result Date: 12/30/2019 CLINICAL DATA:  Abdominal pain. EXAM: CT ABDOMEN AND PELVIS WITHOUT CONTRAST TECHNIQUE: Multidetector CT imaging of the abdomen and pelvis was performed following the standard protocol without IV contrast. COMPARISON:  December 24, 2019. FINDINGS: Lower chest: Small bilateral pleural effusions are noted with adjacent subsegmental atelectasis. Hepatobiliary: No focal liver abnormality is seen. Status post cholecystectomy. No biliary dilatation. Pancreas: Unremarkable. No pancreatic ductal dilatation or surrounding inflammatory changes. Spleen: Normal in size without focal abnormality. Adrenals/Urinary Tract: Adrenal glands appear normal. Interval placement of bilateral percutaneous  nephrostomies. Left ureteral stent is in grossly good position. Right ureteral stent noted on prior exam has been removed. No definite hydronephrosis or renal obstruction is seen at this time. Foley catheter is noted within the urinary bladder. Perivesical fluid collection is again noted, which currently measures 6.0 x 4.3 cm in the transverse orientation which may be slightly enlarged compared to prior exam. Fluid within urinary bladder appears to be high density suggesting possible hemorrhage. Stomach/Bowel: Status post gastric bypass. There is no evidence of bowel obstruction or inflammation. The appendix appears normal. Vascular/Lymphatic: Aortic atherosclerosis. No enlarged abdominal or pelvic lymph nodes. Reproductive: Prostate is unremarkable. Other: Mild anasarca is noted.  No definite hernia is noted. Musculoskeletal: No acute or significant osseous findings. IMPRESSION: 1. Interval placement of bilateral percutaneous nephrostomies. Left ureteral stent is in grossly good position. Right ureteral stent noted on prior exam has been removed. No definite hydronephrosis or renal obstruction is seen at this time. 2. Perivesical fluid collection is again noted, which currently measures 6.0 x 4.3 cm in the transverse orientation which may be slightly enlarged compared to prior exam. Fluid within urinary bladder appears to be high density suggesting possible hemorrhage. 3. Small bilateral pleural effusions are noted with adjacent subsegmental atelectasis. 4. Mild anasarca. 5. Aortic atherosclerosis. Aortic Atherosclerosis (ICD10-I70.0). Electronically Signed   By: Marijo Conception M.D.   On: 12/30/2019 12:47   US PELVIS LIMITED (TRANSABDOMINAL ONLY)  Result Date: 12/30/2019 CLINICAL DATA:  Pelvic fluid collection. EXAM: LIMITED ULTRASOUND OF PELVIS TECHNIQUE: Limited transabdominal ultrasound examination of the pelvis was performed. COMPARISON:  Noncontrast CT earlier this day. Multiple prior abdominopelvic CT.  FINDINGS: Foley catheter decompresses the urinary bladder. Posterior to the catheter balloon is heterogeneous 5.3 x 3.0 x 5.2 cm avascular lesion, corresponding to that seen on CT. There is no internal or peripheral vascularity. There is a small amount of free fluid in the pelvis. IMPRESSION: 1. Heterogeneous lesion in the bladder wall posterior to the Foley catheter balloon measuring 5.3 x 3.0 x 5.2 cm. It is unclear if this represents a solid mass or walled off complex fluid collection. 2. Small amount of simple free fluid in the pelvis. Electronically Signed   By: Keith Rake M.D.   On: 12/30/2019 18:43   CT IMAGE GUIDED DRAINAGE BY PERCUTANEOUS CATHETER  Result Date: 12/31/2019 INDICATION:  54 year old male with suspected colovesicular fistula and abscess formation in the recto vesicular recess. EXAM: CT-guided drain placement MEDICATIONS: The patient is currently admitted to the hospital and receiving intravenous antibiotics. The antibiotics were administered within an appropriate time frame prior to the initiation of the procedure. ANESTHESIA/SEDATION: Fentanyl 100 mcg IV; Versed 2 mg IV Moderate Sedation Time:  21 minutes The patient was continuously monitored during the procedure by the interventional radiology nurse under my direct supervision. COMPLICATIONS: None immediate. PROCEDURE: Informed written consent was obtained from the patient after a thorough discussion of the procedural risks, benefits and alternatives. All questions were addressed. Maximal Sterile Barrier Technique was utilized including caps, mask, sterile gowns, sterile gloves, sterile drape, hand hygiene and skin antiseptic. A timeout was performed prior to the initiation of the procedure. A planning axial CT scan was performed. The fluid collection in the retro vesicular space was successfully identified. A suitable skin entry site was selected to allow for a left transgluteal/parasacral approach. The overlying skin was sterilely  prepped and draped in the standard fashion using chlorhexidine skin prep. Local anesthesia was attained by infiltration with 1% lidocaine. A small dermatotomy was made. Under intermittent CT guidance, an 18 gauge trocar needle was carefully advanced adjacent to the rectum and into the suspected fluid collection. A 0.35 wire was coiled in the fluid collection. The needle was exchanged for a fascial dilator and the soft tissue tract was dilated to 12 [place]. A [place] 12 [place] all-purpose drainage catheter was then advanced over the wire and formed in the fluid collection. Aspiration yields approximately 80 mL of thick, purulent and foul-smelling fluid. A sample was sent for Gram stain and culture. The drainage catheter was flushed and connected to JP bulb suction before being secured to the skin with 0 Prolene suture. The patient tolerated the procedure well. IMPRESSION: Successful placement of a left transgluteal approach 12 French drainage catheter into the retro vesicular fluid collection. Aspiration yields thick, foul-smelling purulent fluid. PLAN: 1. Maintain drain to JP bulb suction. 2. Flush drainage catheter daily. 3. Drainage catheter will likely remain in place until the time of definitive surgery. Electronically Signed   By: Jacqulynn Cadet M.D.   On: 12/31/2019 16:23    Medications / Allergies: per chart  Antibiotics: Anti-infectives (From admission, onward)   Start     Dose/Rate Route Frequency Ordered Stop   01/01/20 1000  fluconazole (DIFLUCAN) tablet 200 mg        200 mg Oral Daily 12/31/19 1618 01/06/20 0959   12/31/19 1800  meropenem (MERREM) 500 mg in sodium chloride 0.9 % 100 mL IVPB        500 mg 200 mL/hr over 30 Minutes Intravenous Every 12 hours 12/31/19 1545     12/27/19 1541  ciprofloxacin (CIPRO) 400 MG/200ML IVPB       Note to Pharmacy: Hilma Favors   : cabinet override      12/27/19 1541 12/28/19 0741   12/27/19 1333  fluconazole (DIFLUCAN) tablet 200 mg        200 mg  Oral Daily 12/27/19 1333 12/31/19 0913   12/27/19 1300  fluconazole (DIFLUCAN) tablet 400 mg  Status:  Discontinued        400 mg Oral Daily 12/27/19 1213 12/27/19 1214   12/27/19 1300  fluconazole (DIFLUCAN) tablet 200 mg  Status:  Discontinued        200 mg Oral Daily 12/27/19 1214 12/27/19 1333   12/25/19 1600  piperacillin-tazobactam (ZOSYN) IVPB 2.25 g  Status:  Discontinued        2.25 g 100 mL/hr over 30 Minutes Intravenous Every 8 hours 12/25/19 1226 12/27/19 1213   12/25/19 1132  ceFAZolin (ANCEF) 2-4 GM/100ML-% IVPB       Note to Pharmacy: [place]  : cabinet override      12/25/19 1132 12/25/19 2344        Note: Portions of this report may have been transcribed using voice recognition software. Every effort was made to ensure accuracy; however, inadvertent computerized transcription errors may be present.   Any transcriptional errors that result from this process are unintentional.    Adin Hector, MD, FACS, MASCRS Gastrointestinal and Minimally Invasive Surgery  [facility] [address], [place], [place] [address] [phone] Fax [phone] Main/Paging  CONTACT INFORMATION: Weekday (9AM-5PM) concerns: Call [facility] main office at [phone] Weeknight (5PM-9AM) or Weekend/Holiday concerns: Check [email or web address] for General Surgery [facility] coverage (Please, do not use SecureChat as it is not reliable communication to operating surgeons for immediate patient care)      01/01/2020  8:13 AM

## 2020-01-01 NOTE — Progress Notes (Signed)
Urology Inpatient Progress Report  Procedure(s): CYSTOSCOPY WITH bilateral  RETROGRADE PYELOGRAM/ leftURETERAL STENT PLACEMENT  7 Days Post-Op   Intv/Subj: No acute events overnight.  Patient is without complaint.  He is on renal diet.  He is anxious to leave the hospital and go back to rehab facility.   Principal Problem:   Acute renal failure superimposed on stage 4 chronic kidney disease (HCC) Active Problems:   CKD (chronic kidney disease), stage IV (HCC)   Cerebral embolism with cerebral infarction   Protein-calorie malnutrition, severe   BPH (benign prostatic hyperplasia)   Colovesical fistula   Pelvic abscess with colovescial fistula   History of gastric bypass   Pernicious anemia   AKI (acute kidney injury) ([place])   Bilateral ureteral obstruction s/p perc nephrosotmy & stenting   ATN (acute tubular necrosis) (HCC)   UTI (urinary tract infection)   Electrolyte abnormality   Increased anion gap metabolic acidosis   Microcytic anemia   ABLA (acute blood loss anemia)   Anxiety and depression   History of CVA (cerebrovascular accident)   BMI 24.0-24.9, adult  Current Facility-Administered Medications  Medication Dose Route Frequency Provider Last Rate Last Admin  . acetaminophen (TYLENOL) tablet 500-1,000 mg  500-1,000 mg Oral Q6H PRN Dwyane Dee, MD      . bisacodyl (DULCOLAX) suppository 10 mg  10 mg Rectal Daily Michael Boston, MD      . Chlorhexidine Gluconate Cloth 2 % PADS 6 each  6 each Topical Daily Marton Redwood III, MD   6 each at 12/31/19 0915  . feeding supplement (BOOST / RESOURCE BREEZE) liquid 1 Container  1 Container Oral TID BM Dwyane Dee, MD      . fluconazole (DIFLUCAN) tablet 200 mg  200 mg Oral Daily Mignon Pine, DO      . HYDROcodone-acetaminophen (NORCO/VICODIN) 5-325 MG per tablet 1-2 tablet  1-2 tablet Oral Q4H PRN Marylyn Ishihara, Tyrone A, DO   2 tablet at 01/01/20 0157  . hydrOXYzine (ATARAX/VISTARIL) tablet 10 mg  10 mg Oral TID PRN Modena Jansky, MD   10 mg at 12/31/19 2151  . lactated ringers infusion   Intravenous Continuous Claudia Desanctis, MD 100 mL/hr at 01/01/20 0158 New Bag at 01/01/20 0158  . meropenem (MERREM) 500 mg in sodium chloride 0.9 % 100 mL IVPB  500 mg Intravenous Q12H Mignon Pine, DO 200 mL/hr at 12/31/19 1843 500 mg at 12/31/19 1843  . multivitamin with minerals tablet 1 tablet  1 tablet Oral Daily Kyle, Tyrone A, DO   1 tablet at 12/31/19 0914  . nicotine (NICODERM CQ - dosed in mg/24 hours) patch 14 mg  14 mg Transdermal Daily Kyle, Tyrone A, DO      . ondansetron (ZOFRAN) tablet 4 mg  4 mg Oral Q8H PRN Dwyane Dee, MD      . polyethylene glycol (MIRALAX / GLYCOLAX) packet 34 g  34 g Oral BID Michael Boston, MD   34 g at 12/31/19 0915  . sevelamer carbonate (RENVELA) tablet 800 mg  800 mg Oral TID WC Claudia Desanctis, MD   800 mg at 12/31/19 1842  . sodium bicarbonate tablet 1,300 mg  1,300 mg Oral BID Claudia Desanctis, MD   1,300 mg at 12/31/19 2150  . sodium chloride flush (NS) 0.9 % injection 5 mL  5 mL Intracatheter Q8H Corrie Mckusick, DO   5 mL at 01/01/20 0636  . sodium chloride flush (NS) 0.9 % injection 5 mL  5 mL Intracatheter Q8H Jacqulynn Cadet, MD   5 mL at 01/01/20 0636  . tamsulosin (FLOMAX) capsule 0.4 mg  0.4 mg Oral Daily Kyle, Tyrone A, DO   0.4 mg at 12/31/19 0914     Objective: Vital: Vitals:   12/31/19 1854 12/31/19 2151 01/01/20 0151 01/01/20 0602  BP: 127/75 140/66 123/73 131/69  Pulse: (!) 46 (!) 48 (!) 51 (!) 48  Resp: 10 13 15 15   Temp: 98.4 F (36.9 C)  98.5 F (36.9 C) 98.8 F (37.1 C)  TempSrc: Oral   Oral  SpO2: 99% 100% 100% 98%  Weight:      Height:       I/Os: I/O last 3 completed shifts: In: 2682.6 [I.V.:2682.6] Out: 1194 [Urine:4350; Drains:72]  Physical Exam:  General: Patient is in no apparent distress Lungs: Normal respiratory effort, chest expands symmetrically. GI:  The abdomen is soft and nontender without mass. R PCN drain: clear yellow urine,  L PCN drain: bloody urine, Rectovesicular drain: ss output (52/20) Foley: clear yellow urine  Ext: lower extremities symmetric  Lab Results: Recent Labs    12/30/19 0603 12/31/19 0528 01/01/20 0430  WBC 6.7 8.2 9.7  HGB 8.7* 8.7* 8.5*  HCT 27.5* 26.9* 27.1*   Recent Labs    12/30/19 0603 12/31/19 0528 01/01/20 0430  NA 142 145 143  K 3.3* 3.5 3.5  CL 110 113* 116*  CO2 18* 18* 18*  GLUCOSE 120* 92 73  BUN 76* 68* 60*  CREATININE 7.77* 6.89* 6.54*  CALCIUM 6.8* 7.2* 7.3*    Studies/Results: CT ABDOMEN PELVIS WO CONTRAST  Result Date: 12/30/2019 CLINICAL DATA:  Abdominal pain. EXAM: CT ABDOMEN AND PELVIS WITHOUT CONTRAST TECHNIQUE: Multidetector CT imaging of the abdomen and pelvis was performed following the standard protocol without IV contrast. COMPARISON:  December 24, 2019. FINDINGS: Lower chest: Small bilateral pleural effusions are noted with adjacent subsegmental atelectasis. Hepatobiliary: No focal liver abnormality is seen. Status post cholecystectomy. No biliary dilatation. Pancreas: Unremarkable. No pancreatic ductal dilatation or surrounding inflammatory changes. Spleen: Normal in size without focal abnormality. Adrenals/Urinary Tract: Adrenal glands appear normal. Interval placement of bilateral percutaneous nephrostomies. Left ureteral stent is in grossly good position. Right ureteral stent noted on prior exam has been removed. No definite hydronephrosis or renal obstruction is seen at this time. Foley catheter is noted within the urinary bladder. Perivesical fluid collection is again noted, which currently measures 6.0 x 4.3 cm in the transverse orientation which may be slightly enlarged compared to prior exam. Fluid within urinary bladder appears to be high density suggesting possible hemorrhage. Stomach/Bowel: Status post gastric bypass. There is no evidence of bowel obstruction or inflammation. The appendix appears normal. Vascular/Lymphatic: Aortic atherosclerosis.  No enlarged abdominal or pelvic lymph nodes. Reproductive: Prostate is unremarkable. Other: Mild anasarca is noted.  No definite hernia is noted. Musculoskeletal: No acute or significant osseous findings. IMPRESSION: 1. Interval placement of bilateral percutaneous nephrostomies. Left ureteral stent is in grossly good position. Right ureteral stent noted on prior exam has been removed. No definite hydronephrosis or renal obstruction is seen at this time. 2. Perivesical fluid collection is again noted, which currently measures 6.0 x 4.3 cm in the transverse orientation which may be slightly enlarged compared to prior exam. Fluid within urinary bladder appears to be high density suggesting possible hemorrhage. 3. Small bilateral pleural effusions are noted with adjacent subsegmental atelectasis. 4. Mild anasarca. 5. Aortic atherosclerosis. Aortic Atherosclerosis (ICD10-I70.0). Electronically Signed   By: Marijo Conception  M.D.   On: 12/30/2019 12:47   US PELVIS LIMITED (TRANSABDOMINAL ONLY)  Result Date: 12/30/2019 CLINICAL DATA:  Pelvic fluid collection. EXAM: LIMITED ULTRASOUND OF PELVIS TECHNIQUE: Limited transabdominal ultrasound examination of the pelvis was performed. COMPARISON:  Noncontrast CT earlier this day. Multiple prior abdominopelvic CT. FINDINGS: Foley catheter decompresses the urinary bladder. Posterior to the catheter balloon is heterogeneous 5.3 x 3.0 x 5.2 cm avascular lesion, corresponding to that seen on CT. There is no internal or peripheral vascularity. There is a small amount of free fluid in the pelvis. IMPRESSION: 1. Heterogeneous lesion in the bladder wall posterior to the Foley catheter balloon measuring 5.3 x 3.0 x 5.2 cm. It is unclear if this represents a solid mass or walled off complex fluid collection. 2. Small amount of simple free fluid in the pelvis. Electronically Signed   By: Keith Rake M.D.   On: 12/30/2019 18:43   CT IMAGE GUIDED DRAINAGE BY PERCUTANEOUS  CATHETER  Result Date: 12/31/2019 INDICATION: 54 year old male with suspected colovesicular fistula and abscess formation in the recto vesicular recess. EXAM: CT-guided drain placement MEDICATIONS: The patient is currently admitted to the hospital and receiving intravenous antibiotics. The antibiotics were administered within an appropriate time frame prior to the initiation of the procedure. ANESTHESIA/SEDATION: Fentanyl 100 mcg IV; Versed 2 mg IV Moderate Sedation Time:  21 minutes The patient was continuously monitored during the procedure by the interventional radiology nurse under my direct supervision. COMPLICATIONS: None immediate. PROCEDURE: Informed written consent was obtained from the patient after a thorough discussion of the procedural risks, benefits and alternatives. All questions were addressed. Maximal Sterile Barrier Technique was utilized including caps, mask, sterile gowns, sterile gloves, sterile drape, hand hygiene and skin antiseptic. A timeout was performed prior to the initiation of the procedure. A planning axial CT scan was performed. The fluid collection in the retro vesicular space was successfully identified. A suitable skin entry site was selected to allow for a left transgluteal/parasacral approach. The overlying skin was sterilely prepped and draped in the standard fashion using chlorhexidine skin prep. Local anesthesia was attained by infiltration with 1% lidocaine. A small dermatotomy was made. Under intermittent CT guidance, an 18 gauge trocar needle was carefully advanced adjacent to the rectum and into the suspected fluid collection. A 0.35 wire was coiled in the fluid collection. The needle was exchanged for a fascial dilator and the soft tissue tract was dilated to 12 [place]. A Cook 12 [place] all-purpose drainage catheter was then advanced over the wire and formed in the fluid collection. Aspiration yields approximately 80 mL of thick, purulent and foul-smelling fluid. A  sample was sent for Gram stain and culture. The drainage catheter was flushed and connected to JP bulb suction before being secured to the skin with 0 Prolene suture. The patient tolerated the procedure well. IMPRESSION: Successful placement of a left transgluteal approach 12 French drainage catheter into the retro vesicular fluid collection. Aspiration yields thick, foul-smelling purulent fluid. PLAN: 1. Maintain drain to JP bulb suction. 2. Flush drainage catheter daily. 3. Drainage catheter will likely remain in place until the time of definitive surgery. Electronically Signed   By: Jacqulynn Cadet M.D.   On: 12/31/2019 16:23     Assessment/Plan: 1) Colovesical fistula: Pt has known colovesical fistula.  Patient underwent IR guided drainage of rectovesicular abscess yesterday.  JP remains in place.   2) AKI/CKD:Renal functioncontinues to gradually improve creatinine 6.54 this AM.  Do not suspect that this is all obstructive but  certainly may be contributory.Continue nephrostomy drainage.  Left ureteral stent appears in appropriate position on CT.  Would recommend IR to internalize a right ureteral stent at some point (not urgent) in preparation for repair of colovesical fistula to identify ureters intraoperatively but should leave nephrostomy tubes in place as well to optimize renal drainage and function.  3) Bladder outlet obstruction: Continue foley with monthly catheter changes.  4) Funguria: Clinically asymptomatic. Currently on fluconazole per ID recommendations considering recent invasive procedures.   LOS: 8 days     Jacalyn Lefevre, MD Urology 01/01/2020, 7:03 AM

## 2020-01-02 ENCOUNTER — Encounter (HOSPITAL_COMMUNITY): Payer: Self-pay | Admitting: Internal Medicine

## 2020-01-02 DIAGNOSIS — N189 Chronic kidney disease, unspecified: Secondary | ICD-10-CM

## 2020-01-02 DIAGNOSIS — R52 Pain, unspecified: Secondary | ICD-10-CM

## 2020-01-02 DIAGNOSIS — D631 Anemia in chronic kidney disease: Secondary | ICD-10-CM

## 2020-01-02 DIAGNOSIS — F1190 Opioid use, unspecified, uncomplicated: Secondary | ICD-10-CM

## 2020-01-02 LAB — CBC WITH DIFFERENTIAL/PLATELET
Abs Immature Granulocytes: 0.06 10*3/uL (ref 0.00–0.07)
Basophils Absolute: 0 10*3/uL (ref 0.0–0.1)
Basophils Relative: 0 %
Eosinophils Absolute: 0.5 10*3/uL (ref 0.0–0.5)
Eosinophils Relative: 6 %
HCT: 24.3 % — ABNORMAL LOW (ref 39.0–52.0)
Hemoglobin: 7.6 g/dL — ABNORMAL LOW (ref 13.0–17.0)
Immature Granulocytes: 1 %
Lymphocytes Relative: 16 %
Lymphs Abs: 1.6 10*3/uL (ref 0.7–4.0)
MCH: 30.2 pg (ref 26.0–34.0)
MCHC: 31.3 g/dL (ref 30.0–36.0)
MCV: 96.4 fL (ref 80.0–100.0)
Monocytes Absolute: 0.7 10*3/uL (ref 0.1–1.0)
Monocytes Relative: 7 %
Neutro Abs: 6.9 10*3/uL (ref 1.7–7.7)
Neutrophils Relative %: 70 %
Platelets: 177 10*3/uL (ref 150–400)
RBC: 2.52 MIL/uL — ABNORMAL LOW (ref 4.22–5.81)
RDW: 17.5 % — ABNORMAL HIGH (ref 11.5–15.5)
WBC: 9.8 10*3/uL (ref 4.0–10.5)
nRBC: 0 % (ref 0.0–0.2)

## 2020-01-02 LAB — RENAL FUNCTION PANEL
Albumin: 1.9 g/dL — ABNORMAL LOW (ref 3.5–5.0)
Anion gap: 12 (ref 5–15)
BUN: 61 mg/dL — ABNORMAL HIGH (ref 6–20)
CO2: 19 mmol/L — ABNORMAL LOW (ref 22–32)
Calcium: 7.5 mg/dL — ABNORMAL LOW (ref 8.9–10.3)
Chloride: 113 mmol/L — ABNORMAL HIGH (ref 98–111)
Creatinine, Ser: 6.27 mg/dL — ABNORMAL HIGH (ref 0.61–1.24)
GFR calc Af Amer: 11 mL/min — ABNORMAL LOW (ref >60)
GFR calc non Af Amer: 9 mL/min — ABNORMAL LOW (ref >60)
Glucose, Bld: 84 mg/dL (ref 70–99)
Phosphorus: 5.9 mg/dL — ABNORMAL HIGH (ref 2.5–4.6)
Potassium: 3.9 mmol/L (ref 3.5–5.1)
Sodium: 144 mmol/L (ref 135–145)

## 2020-01-02 LAB — AEROBIC/ANAEROBIC CULTURE W GRAM STAIN (SURGICAL/DEEP WOUND)

## 2020-01-02 LAB — MAGNESIUM: Magnesium: 2.1 mg/dL (ref 1.7–2.4)

## 2020-01-02 LAB — PREPARE RBC (CROSSMATCH)

## 2020-01-02 MED ORDER — SODIUM CHLORIDE 0.9% IV SOLUTION: Freq: Once | INTRAVENOUS | Status: DC

## 2020-01-02 NOTE — Progress Notes (Signed)
[name] Progress Note  Subjective:  Palliative has been seeing him and discussing goals.  Had 3.3 liters UOP over 10/2.  He had been on LR at 100 ml/hr - just stopped to give abx.  GI consulted for colovesical fistula.  He really doesn't like the renal diet - can't find anything he wants to eat.  Thinks neph tube output is clearing. We discussed risks/benefits/indications for transfusion of packed blood cells and he consents to blood transfusion.    Review of systems:     Denies shortness of breath  No chest pain  No n/v  Vitals:   01/01/20 1506 01/01/20 1856 01/01/20 2101 01/02/20 0643  BP: 120/65 139/83 119/69 132/65  Pulse: (!) 51 (!) 57 63 (!) 48  Resp: 11 12 14 14  Temp: 98.5 F (36.9 C) 98.2 F (36.8 C) 98.9 F (37.2 C) 98.6 F (37 C)  TempSrc: Oral Oral Oral Oral  SpO2: 100% 100% 99% 99%  Weight:      Height:        Exam: Gen adult male in bed in NAD; chronically ill appearing    HENT NCAT  Chest clear bilaterally unlabored on room air S1S2 no rub Abd soft ntnd  GU foley cath in place; neph tubes in place Ext no lower extremity edema appreciated Neuro is alert, Ox 3; provides hx and follows commands Psych normal mood and affect    Home meds:  - cymbalta 30-60 hs/ neurontin 800 hs/ norco qid prn  - midodrine 2.5- 5 tid prn  - flomax 0.4 qd  - ensure tid bm/ MVI/ atarax prn      CT renal stone study noncon >  IMPRESSION: 1. Reaccumulation of small to moderate volume of fluid above the urinary bladder in this patient with suspected defect in the urinary bladder. No adjacent colonic thickening. Study limited by lack of rectal or vesicle contrast with respect to detection of colovesical fistula though the fat plane appears reasonably well preserved between the collection in the colon on the current study. 2. Interval development of moderate to marked bilateral hydroureteronephrosis despite presence of bilateral ureteral stents. 3. Aortic  atherosclerosis.    VS BP 114/ 66, HR 70  RR 18  RA 100%  Temp 98   Na 131 K 5.5  CO2 7  BUN 81  Cr 7.19  Ca 8.0  Hb 8.9  WBC 7k  plt 220   COVID negative   UA many bact >50 rbc/ wbc, 100 prot, turbid     Assessment/ Plan: 1. AoCKD 4 - b/l creat 2.4- 2.8, eGFR 24- 29ml/min. AKI presumably due to obstruction w/ new and severe bilat hydronephrosis by imaging.  Also with hypotension/ischemic and pre-renal insults. Urology did procedure 9/25 w/ L ureteral stent replaced but could not get R ureteral stent in position.  Had bilateral neph tubes with IR on 9/27.  Decreased renal reserve with CKD stage IV prior to current episode and pt states aware of his CKD since 2020 covid diagnosis at least and had hematuria a few years ago.  Last Cr 2.78 on 12/08/19 before current admission 1. Some slow, continued improvement with neph tubes and fluids 2. Continue LR at 100 ml/hr 3. 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 Hopeful for continued improvement with conservative measures 5. Liberalized diet  2. Bradycardia - improving; no longer hyperkalemic and not on nodal blocking agents   3. Met acidosis - improved; on oral bicarb   and LR 4. Hypokalemia - mag is replete; Ca corrects with adjustment for albumin; on LR 5. Hematuria/ bilat hydronephrosis/ hx ureteral stents - tortuous ureters per the OP note by urology. Urology recommends IR to internalize a right ureteral stent at some point in preparation for repair of colovesical fistula to identify ureters intraoperatively 6. Pelvic abscess between bladder and colon - Had IR drain placement to pelvic abscess on 10/1. abx per primary   7. H/o CVA - left hemiparesis, had been at a SNF  8. Hypocalcemia - s/p repletion IV.  Improves with corr for albumin. intact PTH ok at 48 9. hyperphos - renvela with meals for now. Advanced from renal to regular diet; he's on nepro as well as ensure/enlive 10. Normocytic anemia - iron  deficient. Repleted with feraheme on 9/29.  S/p aranesp 40 mcg on 9/30 (one-time-order).  Transfuse PRBC's on 10/3 1 unit    Disposition: Continue inpatient monitoring    Recent Labs  Lab 01/01/20 0430 01/02/20 0427  K 3.5 3.9  BUN 60* 61*  CREATININE 6.54* 6.27*  CALCIUM 7.3* 7.5*  PHOS 6.2* 5.9*  HGB 8.5* 7.6*   Inpatient medications: . bisacodyl  10 mg Rectal Daily  . Chlorhexidine Gluconate Cloth  6 each Topical Daily  . feeding supplement  1 Container Oral TID BM  . fluconazole  200 mg Oral Daily  . gabapentin  200 mg Oral Daily  . multivitamin with minerals  1 tablet Oral Daily  . nicotine  14 mg Transdermal Daily  . sevelamer carbonate  800 mg Oral TID WC  . sodium bicarbonate  1,300 mg Oral BID  . sodium chloride flush  5 mL Intracatheter Q8H  . sodium chloride flush  5 mL Intracatheter Q8H  . tamsulosin  0.4 mg Oral Daily   . lactated ringers 100 mL/hr at 01/02/20 0230  . meropenem (MERREM) IV 500 mg (01/01/20 2200)   acetaminophen, HYDROcodone-acetaminophen, hydrOXYzine, ondansetron   Claudia Desanctis, MD 01/02/2020  2:00 PM

## 2020-01-02 NOTE — Progress Notes (Signed)
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    CT abdomen on admission showed severe bilateral hydronephrosis and possible colovesical fistula. On admission creatinine 7.1 (baseline 2.4-2.8).   Admitted for acute on chronic kidney disease, severe anion gap metabolic acidosis, hyperkalemia, bilateral hydronephrosis.  Nephrology and urology consulting.  9/25, s/p left ureteral stent replaced but right ureteral stent unsuccessful.  Bilateral percutaneous nephrostomies placed by IR 12/27/19.  His creatinine peaked at 8.47 and started to show some signs of downtrending. Urine output remained adequate consistent with ATN/diuretic phase. He underwent repeat CT abdomen/pelvis on 12/30/2019 which revealed "Perivesical fluid collection is again noted, which currently measures 6.0 x 4.3 cm in the transverse orientation which may be slightly enlarged compared to prior exam".  IR was consulted for possible drain placement into fluid collection.  He underwent successful drain placement on 12/31/2019 and cultures were sent for testing. Infectious disease was also consulted and patient was started on meropenem and continued with fluconazole while cultures further matured. He was also evaluated by GI and tentative plans are for colonoscopy the day prior to fistula repair if able to be coordinated in that manner.   Interval  History:  No events overnight.   Pleasantly resting in bed in no distress this morning.  Still is okay with the plan in place for eventual fistula repair.  He was evaluated by GI yesterday and is also amenable for colonoscopy prior to surgery. He thinks that his left-sided nephrostomy tube is starting to finally clear just a little bit.  No other acute concerns or complaints this morning.  Old records reviewed in assessment of this patient  ROS: Constitutional: negative for chills and fevers, Respiratory: negative for cough, Cardiovascular: negative for chest pain and Gastrointestinal: negative for abdominal pain  Assessment & Plan: * Acute renal failure superimposed on stage 4 chronic kidney disease [facility]) Nephrology following. Baseline creatinine 2.4-2.8. Presented with creatinine of 7.1, potassium 5.5 and bicarbonate 7. CT abdomen showed bilateral severe hydronephrosis. Foley catheter in place.  -Creatinine appears to have peaked at 8.47 on 12/28/2019. Appears to be in his diuretic phase - On 9/25per Urologys/p left ureteral stent replaced but right ureteral stent unsuccessful due to tortuosity - per urology: "Would recommend IR to internalize a right ureteral stent at some point (not urgent) in preparation for repair of colovesical fistula to identify ureters intraoperatively"  - now s/p B/L PCN by IR on 9/27. Tubes to remain in place until fistula repair completed in future - Still holding off on dialysis and does not meet criteria. Appreciate nephrology assistance -strict I&O -Diet liberalized per nephrology  Pelvic abscess with colovescial fistula - greatly appreciate multi-modality input (surgery, nephrology, urology, IR, ID, GI) - definitive repair still being determined; per surgery possibly remaining inpatient until surgery can take place - s/p IR pelvic abscess drain on 10/1 (culture growing "few yeast"); ID consulted; patient now on meropenem and fluconazole (C. Albicans  growing in  L/R nephrostomy tube cultures and as noted now the deep pelvic culture is growing few yeast from 10/1) - if he does spike fever or significant leukocytosis, needs blood cultures - evaluated by GI on 10/2; tentative plan is for CLN the day prior to fistula repair; timing of this is still to be determined   Bilateral ureteral obstruction s/p perc nephrosotmy & stenting -Bilateral nephrostomy tubes to remain in place per urology until definitive repair of colovesical fistula -Also recommendations per urology are for IR to internalize right ureteral stent in preparation for fistula repair in order to identify ureters intraoperatively  History of CVA (cerebrovascular accident) -CVA on 10/18/2019 MRI.  Small bilateral acute cerebral and cerebellar infarcts.  Residual left hemiparesis  Electrolyte abnormality Hyperkalemia, hypokalemia, hypocalcemia, hyperphosphatemia -Treating appropriately as indicated per each - iPTH = 50 - continue sevelamer - IVF changed to LR per nephrology   UTI (urinary tract infection) - recent urological interventions, dysuria although he is afebrile and without leukocytosis. Previous urine cultures had shown E. coli and Pseudomonas. Patient was started on IV Zosyn. However urine culture then showed 70 K colonies of yeast, speciated to C. Albicans - remains on fluconazole; ID now formally consulted, continue to follow recommendations  BMI 24.0-24.9, adult - continue supplements and MVI  Anxiety and depression - resume cymbalta if renal function returns near baseline  ABLA (acute blood loss anemia) - see microcytic anemia  Microcytic anemia - has required 2 units PRBC so far; Hgb still slowly downtrending - continue to monitor and will transfuse further as needed  Increased anion gap metabolic acidosis Bicarbonate drip discontinued and started oral bicarbonate 9/27. - also on LR  ATN (acute tubular necrosis) (HCC) - see AoCKDIV  AKI (acute  kidney injury) (Short) - see AoCKDIV   Antimicrobials: Zosyn 12/25/2019>>9/27 Fluconazole 9/27>> present  Meropenem 10/1>> present   DVT prophylaxis: SCD Code Status: Full Family Communication: None present Disposition Plan: Status is: Inpatient  Remains inpatient appropriate because:Ongoing diagnostic testing needed not appropriate for outpatient work up, IV treatments appropriate due to intensity of illness or inability to take PO and Inpatient level of care appropriate due to severity of illness   Dispo: The patient is from: Home              Anticipated d/c is to: SNF              Anticipated d/c date is: > 3 days              Patient currently is not medically stable to d/c.  Objective: Blood pressure 132/65, pulse (!) 48, temperature 98.6 F (37 C), temperature source Oral, resp. rate 14, height 5\' 3"  (1.6 m), weight 61.8 kg, SpO2 99 %.  Examination: General appearance: alert, cooperative and no distress Head: Normocephalic, without obvious abnormality, atraumatic Eyes: EOMI Lungs: clear to auscultation bilaterally Heart: regular rate and rhythm and S1, S2 normal Abdomen: normal findings: bowel sounds normal and soft, non-tender and Left nephrostomy tube noted with bloody urine.  Right nephrostomy tube clear urine and Foley catheter clear urine. Pelvic abscess bulb noted with serosanguinous fluid as well Extremities: no edema Skin: mobility and turgor normal Neurologic: no focal deficits  Consultants:   Nephrology  Urology  General surgery  IR  ID  GI   Data Reviewed: I have personally reviewed following labs and imaging studies Results for orders placed or performed during the hospital encounter of 12/24/19 (from the past 24 hour(s))  CBC with Differential/Platelet  Status: Abnormal   Collection Time: 01/02/20  4:27 AM  Result Value Ref Range   WBC 9.8 4.0 - 10.5 K/uL   RBC 2.52 (L) 4.22 - 5.81 MIL/uL   Hemoglobin 7.6 (L) 13.0 - 17.0 g/dL   HCT 24.3  (L) 39 - 52 %   MCV 96.4 80.0 - 100.0 fL   MCH 30.2 26.0 - 34.0 pg   MCHC 31.3 30.0 - 36.0 g/dL   RDW 17.5 (H) 11.5 - 15.5 %   Platelets 177 150 - 400 K/uL   nRBC 0.0 0.0 - 0.2 %   Neutrophils Relative % 70 %   Neutro Abs 6.9 1.7 - 7.7 K/uL   Lymphocytes Relative 16 %   Lymphs Abs 1.6 0.7 - 4.0 K/uL   Monocytes Relative 7 %   Monocytes Absolute 0.7 0 - 1 K/uL   Eosinophils Relative 6 %   Eosinophils Absolute 0.5 0 - 0 K/uL   Basophils Relative 0 %   Basophils Absolute 0.0 0 - 0 K/uL   Immature Granulocytes 1 %   Abs Immature Granulocytes 0.06 0.00 - 0.07 K/uL  Magnesium     Status: None   Collection Time: 01/02/20  4:27 AM  Result Value Ref Range   Magnesium 2.1 1.7 - 2.4 mg/dL  Renal function panel     Status: Abnormal   Collection Time: 01/02/20  4:27 AM  Result Value Ref Range   Sodium 144 135 - 145 mmol/L   Potassium 3.9 3.5 - 5.1 mmol/L   Chloride 113 (H) 98 - 111 mmol/L   CO2 19 (L) 22 - 32 mmol/L   Glucose, Bld 84 70 - 99 mg/dL   BUN 61 (H) 6 - 20 mg/dL   Creatinine, Ser 6.27 (H) 0.61 - 1.24 mg/dL   Calcium 7.5 (L) 8.9 - 10.3 mg/dL   Phosphorus 5.9 (H) 2.5 - 4.6 mg/dL   Albumin 1.9 (L) 3.5 - 5.0 g/dL   GFR calc non Af Amer 9 (L) >60 mL/min   GFR calc Af Amer 11 (L) >60 mL/min   Anion gap 12 5 - 15    Recent Results (from the past 240 hour(s))  Urine culture     Status: Abnormal   Collection Time: 12/24/19 10:33 AM   Specimen: Urine, Clean Catch  Result Value Ref Range Status   Specimen Description   Final    URINE, CLEAN CATCH Performed at [facility], [place] [address], [place], [place] [address]    Special Requests   Final    NONE Performed at [facility], [place] [address] [place], [place] [address]    Culture 70,000 COLONIES/mL YEAST (A)  Final   Report Status 12/26/2019 FINAL  Final  Respiratory Panel by RT PCR (Flu A&B, Covid) - Nasopharyngeal Swab     Status: None   Collection Time: 12/24/19  2:22 PM   Specimen:  Nasopharyngeal Swab  Result Value Ref Range Status   SARS Coronavirus 2 by RT PCR NEGATIVE NEGATIVE Final    Comment: (NOTE) SARS-CoV-2 target nucleic acids are NOT DETECTED.  The SARS-CoV-2 RNA is generally detectable in upper respiratoy specimens during the acute phase of infection. The lowest concentration of SARS-CoV-2 viral copies this assay can detect is 131 copies/mL. A negative result does not preclude SARS-Cov-2 infection and should not be used as the sole basis for treatment or other patient management decisions. A negative result may occur with  improper specimen collection/handling, submission of specimen other than  nasopharyngeal swab, presence of viral mutation(s) within the areas targeted by this assay, and inadequate number of viral copies (<131 copies/mL). A negative result must be combined with clinical observations, patient history, and epidemiological information. The expected result is Negative.  Fact Sheet for Patients:  PinkCheek.be  Fact Sheet for Healthcare Providers:  GravelBags.it  This test is no t yet approved or cleared by the [place] FDA and  has been authorized for detection and/or diagnosis of SARS-CoV-2 by FDA under an Emergency Use Authorization (EUA). This EUA will remain  in effect (meaning this test can be used) for the duration of the COVID-19 declaration under Section 564(b)(1) of the Act, 21 U.S.C. section 360bbb-3(b)(1), unless the authorization is terminated or revoked sooner.     Influenza A by PCR NEGATIVE NEGATIVE Final   Influenza B by PCR NEGATIVE NEGATIVE Final    Comment: (NOTE) The Xpert Xpress SARS-CoV-2/FLU/RSV assay is intended as an aid in  the diagnosis of influenza from Nasopharyngeal swab specimens and  should not be used as a sole basis for treatment. Nasal washings and  aspirates are unacceptable for Xpert Xpress SARS-CoV-2/FLU/RSV  testing.  Fact Sheet  for Patients: PinkCheek.be  Fact Sheet for Healthcare Providers: GravelBags.it  This test is not yet approved or cleared by the [place] FDA and  has been authorized for detection and/or diagnosis of SARS-CoV-2 by  FDA under an Emergency Use Authorization (EUA). This EUA will remain  in effect (meaning this test can be used) for the duration of the  Covid-19 declaration under Section 564(b)(1) of the Act, 21  U.S.C. section 360bbb-3(b)(1), unless the authorization is  terminated or revoked. Performed at [facility], [place] [address], [place], [place] [address]   Aerobic/Anaerobic Culture (surgical/deep wound)     Status: None   Collection Time: 12/27/19  4:26 PM   Specimen: Abscess  Result Value Ref Range Status   Specimen Description   Final    ABSCESS LEFT KIDNEY Performed at [place] [address], [place], [place] [address]    Special Requests   Final    NONE Performed at [facility], [place] [address] [place], [place] [address]    Gram Stain   Final    MODERATE WBC PRESENT, PREDOMINANTLY MONONUCLEAR FEW YEAST    Culture   Final    FEW CANDIDA ALBICANS NO ANAEROBES ISOLATED Performed at [facility], [place] [address] [place], [place] [address]    Report Status 01/01/2020 FINAL  Final  Aerobic/Anaerobic Culture (surgical/deep wound)     Status: None   Collection Time: 12/27/19  4:36 PM   Specimen: Abscess  Result Value Ref Range Status   Specimen Description   Final    ABSCESS RIGHT KIDNEY Performed at [place] [address] [place], [place] [address]    Special Requests   Final    NONE Performed at [facility], [place] [address], [place], [place] [address]    Gram Stain   Final    RARE WBC PRESENT,BOTH PMN AND MONONUCLEAR NO ORGANISMS SEEN    Culture   Final    FEW CANDIDA ALBICANS NO  ANAEROBES ISOLATED Performed at [facility], [place] [address] [place],  [address]    Report Status 01/02/2020 FINAL  Final  Aerobic/Anaerobic Culture (surgical/deep wound)     Status: None (Preliminary result)   Collection Time: 12/31/19  1:06 PM   Specimen: Abscess  Result Value Ref Range Status   Specimen Description   Final    ABSCESS Performed at [place] [address] [place], [place] [address]    Special Requests   Final    Normal Performed at [facility], [place] [address], [place], [place] [address]    Gram Stain   Final    MANY WBC PRESENT, PREDOMINANTLY PMN NO ORGANISMS SEEN Performed at [facility], [place] [address], [place], [place] [address]    Culture   Final    FEW YEAST NO ANAEROBES ISOLATED; CULTURE IN PROGRESS FOR 5 DAYS    Report Status PENDING  Incomplete     Radiology Studies: No results found. CT IMAGE GUIDED DRAINAGE BY PERCUTANEOUS CATHETER  Final Result    US PELVIS LIMITED (TRANSABDOMINAL ONLY)  Final Result    CT ABDOMEN PELVIS WO CONTRAST  Final Result    IR NEPHROSTOMY PLACEMENT RIGHT  Final Result    IR NEPHROSTOMY PLACEMENT LEFT  Final Result    US RENAL  Final Result    DG C-Arm 1-60 Min-No Report  Final Result    CT Renal Stone Study  Final Result  Addendum 1 of 1  ADDENDUM REPORT: 12/24/2019 12:35    ADDENDUM:  The possibility of a leak in the posterior urinary bladder in this  patient thought to have a fistula was again discussed with the  provider. Additionally, the presence of marked bilateral  hydronephrosis, worse on the RIGHT also was discussed. Perhaps a  cystogram prior to stented exchange if will not significantly delay  care could be helpful for further management as clinically  warranted. This would avoid the confounding effect of contrast which  could be introduced during stent exchange. Urologic consultation was  suggested.    These results  were called by telephone at the time of interpretation  on 12/24/2019 at 12:34 pm to provider [facility] , who verbally  acknowledged these results.      Electronically Signed    By: Zetta Bills M.D.    On: 12/24/2019 12:35      Final      Scheduled Meds: . sodium chloride   Intravenous Once  . bisacodyl  10 mg Rectal Daily  . Chlorhexidine Gluconate Cloth  6 each Topical Daily  . feeding supplement  1 Container Oral TID BM  . fluconazole  200 mg Oral Daily  . gabapentin  200 mg Oral Daily  . multivitamin with minerals  1 tablet Oral Daily  . nicotine  14 mg Transdermal Daily  . sevelamer carbonate  800 mg Oral TID WC  . sodium bicarbonate  1,300 mg Oral BID  . sodium chloride flush  5 mL Intracatheter Q8H  . sodium chloride flush  5 mL Intracatheter Q8H  . tamsulosin  0.4 mg Oral Daily   PRN Meds: acetaminophen, HYDROcodone-acetaminophen, hydrOXYzine, ondansetron Continuous Infusions: . lactated ringers 100 mL/hr at 01/02/20 1345  . meropenem (MERREM) IV 500 mg (01/02/20 1353)      LOS: 9 days  Time spent: Greater than 50% of the 35 minute visit was spent in counseling/coordination of care for the patient as laid out in the A&P.   Dwyane Dee, MD [facility] 01/02/2020, 2:07 PM  Contact via secure chat.  To contact the attending provider between 7A-7P or the covering provider during after hours 7P-7A, please log into the web site [email or web address] and access using universal [place] password for that web site. If  you do not have the password, please call the hospital operator.

## 2020-01-02 NOTE — Progress Notes (Signed)
Daily Progress Note   Patient Name: Chase [name]       Date: 01/02/2020 DOB: 04-05-1965  Age: 54 y.o. MRN#: [id number] Attending Physician: Dwyane Dee, MD Primary Care Physician: [name] Admit Date: 12/24/2019  Reason for Consultation/Follow-up: Establishing goals of care  Subjective: Awake alert resting in bed.  Eating breakfast, ate around 80% of his meals.  See below.   Length of Stay: 9  Current Medications: Scheduled Meds:  . bisacodyl  10 mg Rectal Daily  . Chlorhexidine Gluconate Cloth  6 each Topical Daily  . feeding supplement  1 Container Oral TID BM  . fluconazole  200 mg Oral Daily  . gabapentin  200 mg Oral Daily  . multivitamin with minerals  1 tablet Oral Daily  . nicotine  14 mg Transdermal Daily  . sevelamer carbonate  800 mg Oral TID WC  . sodium bicarbonate  1,300 mg Oral BID  . sodium chloride flush  5 mL Intracatheter Q8H  . sodium chloride flush  5 mL Intracatheter Q8H  . tamsulosin  0.4 mg Oral Daily    Continuous Infusions: . lactated ringers 100 mL/hr at 01/02/20 0230  . meropenem (MERREM) IV 500 mg (01/01/20 2200)    PRN Meds: acetaminophen, HYDROcodone-acetaminophen, hydrOXYzine, ondansetron  Physical Exam         Awake alert resting in bed Appears cachectic, appears pale Has IR placed drain 10-1 Appears with some muscle wasting Has regular work of breathing S1-S2 Abdomen is not tender  Vital Signs: BP 132/65 (BP Location: Left Arm)   Pulse (!) 48   Temp 98.6 F (37 C) (Oral)   Resp 14   Ht 5\' 3"  (1.6 m)   Wt 61.8 kg   SpO2 99%   BMI 24.14 kg/m  SpO2: SpO2: 99 % O2 Device: O2 Device: Room Air O2 Flow Rate: O2 Flow Rate (L/min): 2 L/min  Intake/output summary:   Intake/Output Summary (Last 24 hours) at  01/02/2020 1046 Last data filed at 01/02/2020 0935 Gross per 24 hour  Intake 1535.36 ml  Output 3367 ml  Net -1831.64 ml   LBM: Last BM Date: 01/01/20 Baseline Weight: Weight: 53.8 kg Most recent weight: Weight: 61.8 kg      Palliative performance scale 50% Palliative Assessment/Data:    Flowsheet Rows  Most Recent Value  Intake Tab  Referral Department Hospitalist  Unit at Time of Referral Med/Surg Unit  Palliative Care Primary Diagnosis Nephrology  Date Notified 12/24/19  Palliative Care Type New Palliative care  Reason for referral Clarify Goals of Care  Date of Admission 12/24/19  Date first seen by Palliative Care 12/26/19  # of days Palliative referral response time 2 Day(s)  # of days IP prior to Palliative referral 0  Clinical Assessment  Palliative Performance Scale Score 60%  Psychosocial & Spiritual Assessment  Palliative Care Outcomes  Patient/Family meeting held? Yes  Who was at the meeting? Patient  Palliative Care Outcomes Clarified goals of care      Patient Active Problem List   Diagnosis Date Noted  . ATN (acute tubular necrosis) ([place]) 12/31/2019  . UTI (urinary tract infection) 12/31/2019  . Electrolyte abnormality 12/31/2019  . Increased anion gap metabolic acidosis 38/75/6433  . Microcytic anemia 12/31/2019  . ABLA (acute blood loss anemia) 12/31/2019  . Anxiety and depression 12/31/2019  . History of CVA (cerebrovascular accident) 12/31/2019  . BMI 24.0-24.9, adult 12/31/2019  . Bilateral ureteral obstruction s/p perc nephrosotmy & stenting 12/30/2019  . AKI (acute kidney injury) ([place]) 12/24/2019  . 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 Pelvic abscess with colovescial fistula 11/15/2019  . E. coli UTI (urinary tract infection) 11/15/2019  . PTSD (post-traumatic stress disorder)   . Chemical exposure   . Cervical radiculopathy   . Septic shock ([place]) 11/08/2019  . Protein-calorie malnutrition, severe 10/23/2019  . Cerebral embolism with  cerebral infarction 10/18/2019  . Acute left-sided weakness 10/17/2019  . CKD (chronic kidney disease), stage IV ([place]) 10/17/2019  . Chronic pain 10/17/2019  . Fall 10/17/2019  . Pseudomonas urinary tract infection 08/24/2019  . BPH (benign prostatic hyperplasia) 08/24/2019  . Acute renal failure superimposed on stage 4 chronic kidney disease ([place]) 08/24/2019  . Anemia 08/24/2019  . Dyspnea 08/24/2019  . History of COVID-19 08/24/2019  . History of elevated PSA 08/24/2019  . 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 Hyponatremia 08/24/2019  . Metabolic acidosis 29/51/8841  . Skin lesion of neck 08/24/2019  . Toxic metabolic encephalopathy 66/08/3014  . Cervical spondylosis without myelopathy 06/19/2016  . Neck pain 09/04/2015  . History of stomach ulcers 05/19/2013  . Male erectile dysfunction 05/19/2013  . Hodgkin lymphoma, unspecified, unspecified site ([place]) 12/10/2012  . Iron deficiency anemia 12/10/2012  . Pernicious anemia 12/10/2012  . Arthralgia of multiple sites 04/20/2012  . Postlaminectomy syndrome, not elsewhere classified 04/20/2012    Palliative Care Assessment & Plan   Patient Profile:    Assessment: Acute on chronic kidney disease hydronephrosis ureteral obstruction deemed possibly due to inflammation, probable diverticular abscess, probable colovesical fistula. Past medical history also significant for CKD, GI bleed, chronic pain, gastric bypass  Recommendations/Plan:  Goals of care meeting: Goals of care discussions again undertaken with the patient this morning.  He is aware of his improving serum creatinine numbers and renal function.  We discussed about acute issues pertaining to this hospitalization.  Patient is under evaluation by GI as well as surgery for colovesical fistula.  On 10-1 he underwent IR drain placement.   Patient expresses frustration that while the above-mentioned issues are important and need immediate addressing, they are prolonging his  hospitalization and delaying his overall goals.  He states that his goals are to go back to [place], continue with rehab because his ultimate goal is to go back and be home with his dogs.  He states that he has not seen them for the past several months and that a friend is keeping them.  Offered active listening and supportive care.  Patient is aware of the serious nature of fistula and is aware of why he needs preoperative colonoscopy.  Towards the end of our conversation, the patient states that he will continue this hospitalization, to continue work-up and treatment for colovesical fistula as is being proposed by surgeons and gastroenterology specialist. Brief life review performed.  Goals wishes and values important to the patient attempted to be explored.  Attempted empathic communication and active listening.  Patient is a veteran who was with the [employer] and was in [place], was stationed in the [place] and also briefly took part in operation [employer].  Patient is thanked for his service to the country. Continue current mode of care, continue full code full scope care for now as per patient's preferences.  Goals of Care and Additional Recommendations:  Limitations on Scope of Treatment: Full Scope Treatment  Code Status:    Code Status Orders  (From admission, onward)         Start     Ordered   12/24/19 1538  Full code  Continuous        12/24/19 1537        Code Status History    Date Active Date Inactive Code Status Order ID Comments User Context   11/08/2019 0019 11/19/2019 2232 Full Code [id number]  Jacalyn Lefevre, MD ED   10/17/2019 1832 10/26/2019 1620 Full Code [id number]  Mendel Corning, MD Inpatient   Advance Care Planning Activity       Prognosis:   Unable to determine  Discharge Planning:  To Be Determined Recommend skilled nursing facility rehab, patient wishes to go back to [place] and to have physical therapy continued.  Care plan was  discussed with patient, nursing colleagues  Thank you for allowing the Palliative Medicine Team to assist in the care of this patient.   Time In: 9 Time Out:  9.35 Total Time  35 Prolonged Time Billed  no       Greater than 50%  of this time was spent counseling and coordinating care related to the above assessment and plan.  Loistine Chance, MD  Please contact Palliative Medicine Team phone at [phone] for questions and concerns.

## 2020-01-03 LAB — CBC WITH DIFFERENTIAL/PLATELET
Abs Immature Granulocytes: 0.04 10*3/uL (ref 0.00–0.07)
Basophils Absolute: 0 10*3/uL (ref 0.0–0.1)
Basophils Relative: 1 %
Eosinophils Absolute: 0.5 10*3/uL (ref 0.0–0.5)
Eosinophils Relative: 6 %
HCT: 27.8 % — ABNORMAL LOW (ref 39.0–52.0)
Hemoglobin: 8.9 g/dL — ABNORMAL LOW (ref 13.0–17.0)
Immature Granulocytes: 1 %
Lymphocytes Relative: 19 %
Lymphs Abs: 1.7 10*3/uL (ref 0.7–4.0)
MCH: 30.6 pg (ref 26.0–34.0)
MCHC: 32 g/dL (ref 30.0–36.0)
MCV: 95.5 fL (ref 80.0–100.0)
Monocytes Absolute: 0.6 10*3/uL (ref 0.1–1.0)
Monocytes Relative: 7 %
Neutro Abs: 5.8 10*3/uL (ref 1.7–7.7)
Neutrophils Relative %: 66 %
Platelets: 164 10*3/uL (ref 150–400)
RBC: 2.91 MIL/uL — ABNORMAL LOW (ref 4.22–5.81)
RDW: 17.4 % — ABNORMAL HIGH (ref 11.5–15.5)
WBC: 8.6 10*3/uL (ref 4.0–10.5)
nRBC: 0 % (ref 0.0–0.2)

## 2020-01-03 LAB — RENAL FUNCTION PANEL
Albumin: 1.9 g/dL — ABNORMAL LOW (ref 3.5–5.0)
Anion gap: 9 (ref 5–15)
BUN: 53 mg/dL — ABNORMAL HIGH (ref 6–20)
CO2: 22 mmol/L (ref 22–32)
Calcium: 7.5 mg/dL — ABNORMAL LOW (ref 8.9–10.3)
Chloride: 112 mmol/L — ABNORMAL HIGH (ref 98–111)
Creatinine, Ser: 6.42 mg/dL — ABNORMAL HIGH (ref 0.61–1.24)
GFR calc Af Amer: 10 mL/min — ABNORMAL LOW (ref >60)
GFR calc non Af Amer: 9 mL/min — ABNORMAL LOW (ref >60)
Glucose, Bld: 81 mg/dL (ref 70–99)
Phosphorus: 5.6 mg/dL — ABNORMAL HIGH (ref 2.5–4.6)
Potassium: 3.9 mmol/L (ref 3.5–5.1)
Sodium: 143 mmol/L (ref 135–145)

## 2020-01-03 LAB — TYPE AND SCREEN
ABO/RH(D): O POS
Antibody Screen: NEGATIVE
Unit division: 0

## 2020-01-03 LAB — BPAM RBC
Blood Product Expiration Date: 202110292359
ISSUE DATE / TIME: 202110032252
Unit Type and Rh: 5100

## 2020-01-03 LAB — PREALBUMIN: Prealbumin: 17.1 mg/dL — ABNORMAL LOW (ref 18–38)

## 2020-01-03 LAB — MAGNESIUM: Magnesium: 2 mg/dL (ref 1.7–2.4)

## 2020-01-03 NOTE — Progress Notes (Signed)
Physical Therapy Treatment Patient Details Name: Chase [name] MRN: [id number] DOB: February 26, 1966 Today's Date: 01/03/2020    History of Present Illness 54 yo male admitted from SNF ([facility]) with AKI s/p bil PCN 9/27, colovesical fistula. hx of CKD, CVA 09/2019, Hodgkins lympoma, PTSD, chronic pain    PT Comments    General Comments: AxO x 4 watch [employer].  Following all directions.  Happy now that his Renal Diet was changed to regular.  Eager to D/C back to [place] to finish his Rehab then return to his home in [place] and reunite with his 2 pups he has not seen in weeks.Assisted OOB to amb in hallway. General Gait Details: Min guard for safety. Cues for RW proximity. Tolerated an increased distance.  Too unsteady without walker due to L LE weakness.  Deconditioned.    Follow Up Recommendations  SNF     Equipment Recommendations  None recommended by PT    Recommendations for Other Services       Precautions / Restrictions Precautions Precautions: Fall Precaution Comments: L + R PCN tubes, L bulb and  foley cath    Mobility  Bed Mobility Overal bed mobility: Modified Independent             General bed mobility comments: increased time and caution to multiple drainage bags  Transfers Overall transfer level: Needs assistance Equipment used: Rolling walker (2 wheeled);None Transfers: Sit to/from [facility] Sit to Stand: Supervision Stand pivot transfers: Supervision;Min guard       General transfer comment: good safety cognition and use of hands to steady self  Ambulation/Gait Ambulation/Gait assistance: Supervision;Min guard Gait Distance (Feet): 145 Feet Assistive device: Rolling walker (2 wheeled)   Gait velocity: decreased   General Gait Details: Min guard for safety. Cues for RW proximity. Tolerated an increased distance.  Too unsteady without walker due to L LE weakness.  Deconditioned.   Stairs             Wheelchair  Mobility    Modified Rankin (Stroke Patients Only)       Balance                                            Cognition Arousal/Alertness: Awake/alert Behavior During Therapy: WFL for tasks assessed/performed Overall Cognitive Status: Within Functional Limits for tasks assessed                                 General Comments: AxO x 4 watch [employer].  Following all directions.  Happy now that his Renal Diet was changed to regular.  Eager to D/C back to [place] to finish his Rehab then return to his home in [place] and reunite with his 2 pups he has not seen in weeks.      Exercises      General Comments        Pertinent Vitals/Pain Pain Assessment: Faces Faces Pain Scale: Hurts a little bit Pain Location: B calf "tightness" due to inactivity Pain Descriptors / Indicators: Aching;Discomfort Pain Intervention(s): Monitored during session;Repositioned    Home Living                      Prior Function            PT Goals (current goals can  now be found in the care plan section) Progress towards PT goals: Progressing toward goals    Frequency    Min 2X/week      PT Plan Current plan remains appropriate    Co-evaluation              AM-PAC PT "6 Clicks" Mobility   Outcome Measure  Help needed turning from your back to your side while in a flat bed without using bedrails?: A Little Help needed moving from lying on your back to sitting on the side of a flat bed without using bedrails?: A Little Help needed moving to and from a bed to a chair (including a wheelchair)?: A Little Help needed standing up from a chair using your arms (e.g., wheelchair or bedside chair)?: A Little Help needed to walk in hospital room?: A Little Help needed climbing 3-5 steps with a railing? : A Lot 6 Click Score: 17    End of Session Equipment Utilized During Treatment: Gait belt Activity Tolerance: Patient tolerated treatment  well Patient left: in bed;with call bell/phone within reach;with bed alarm set Nurse Communication: Mobility status PT Visit Diagnosis: Muscle weakness (generalized) (M62.81);Unsteadiness on feet (R26.81);Hemiplegia and hemiparesis     Time: 1440-1505 PT Time Calculation (min) (ACUTE ONLY): 25 min  Charges:  $Gait Training: 8-22 mins $Therapeutic Activity: 8-22 mins                     {Cary Lothrop  PTA Acute  Rehabilitation Services Pager      [phone] Office      [phone]

## 2020-01-03 NOTE — TOC Progression Note (Signed)
Transition of Care [facility]) - Progression Note    Patient Details  Name: Chase [name] MRN: [id number] Date of Birth: 14-Mar-1966  Transition of Care [facility]) CM/SW Contact  Purcell Mouton, RN Phone Number: 01/03/2020, 12:01 PM  Clinical Narrative:    TOC continue to follow. Pt not ready for discharge.    Expected Discharge Plan: [place] Barriers to Discharge: Continued Medical Work up  Expected Discharge Plan and Services Expected Discharge Plan: [place] In-house Referral: Clinical Social Work     Living arrangements for the past 2 months: [place] Expected Discharge Date:  (unknown)                                     Social Determinants of Health (SDOH) Interventions    Readmission Risk Interventions Readmission Risk Prevention Plan 12/25/2019 11/19/2019  Transportation Screening Complete Complete  Medication Review Press photographer) Complete Complete  PCP or Specialist appointment within 3-5 days of discharge - Complete  HRI or [place] - Complete  SW Recovery Care/Counseling Consult - Complete  [place] - Complete  Some recent data might be hidden

## 2020-01-03 NOTE — Progress Notes (Signed)
PROGRESS NOTE    Chase [name]   NTI:[id number]  DOB: 02-Nov-1965  DOA: 12/24/2019     10  PCP: [name]  CC: hematuria   Hospital Course: 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    CT abdomen on admission showed severe bilateral hydronephrosis and possible colovesical fistula. On admission creatinine 7.1 (baseline 2.4-2.8).   Admitted for acute on chronic kidney disease, severe anion gap metabolic acidosis, hyperkalemia, bilateral hydronephrosis.  Nephrology and urology consulting.  9/25, s/p left ureteral stent replaced but right ureteral stent unsuccessful.  Bilateral percutaneous nephrostomies placed by IR 12/27/19.  His creatinine peaked at 8.47 and started to show some signs of downtrending. Urine output remained adequate consistent with ATN/diuretic phase. He underwent repeat CT abdomen/pelvis on 12/30/2019 which revealed "Perivesical fluid collection is again noted, which currently measures 6.0 x 4.3 cm in the transverse orientation which may be slightly enlarged compared to prior exam".  IR was consulted for possible drain placement into fluid collection.  He underwent successful drain placement on 12/31/2019 and cultures were sent for testing. Infectious disease was also consulted and patient was started on meropenem and continued with fluconazole while cultures further matured. He was also evaluated by GI and tentative plans are for colonoscopy the day prior to fistula repair if able to be coordinated in that manner.   Interval  History:  No events overnight.   Despite him wishing he could go back to his rehab he knows the recommendation for now is to remain in the hospital until probable surgery.  He otherwise feels okay this morning.  Left-sided nephrostomy tube is really starting to clear the last couple days but still remains red tinged.  He was happy about his diet being liberalized.  Old records reviewed in assessment of this patient  ROS: Constitutional: negative for chills and fevers, Respiratory: negative for cough, Cardiovascular: negative for chest pain and Gastrointestinal: negative for abdominal pain  Assessment & Plan: * Acute renal failure superimposed on stage 4 chronic kidney disease [facility]) Nephrology following. Baseline creatinine 2.4-2.8. Presented with creatinine of 7.1, potassium 5.5 and bicarbonate 7. CT abdomen showed bilateral severe hydronephrosis. Foley catheter in place.  -Creatinine appears to have peaked at 8.47 on 12/28/2019. Appears to be in his diuretic phase - On 9/25per Urologys/p left ureteral stent replaced but right ureteral stent unsuccessful due to tortuosity - per urology: "Would recommend IR to internalize a right ureteral stent at some point (not urgent) in preparation for repair of colovesical fistula to identify ureters intraoperatively"  - now s/p B/L PCN by IR on 9/27. Tubes to remain in place until fistula repair completed in future - Still holding off on dialysis and does not meet criteria. Appreciate nephrology assistance -strict I&O -Diet liberalized per nephrology  Pelvic abscess with colovescial fistula - greatly appreciate multi-modality input (surgery, nephrology, urology, IR, ID, GI) - definitive repair still being determined; per surgery possibly remaining inpatient until surgery can take place - s/p IR pelvic abscess drain on 10/1 (culture growing "few yeast"); ID consulted; patient now on meropenem and fluconazole (C. Albicans growing in L/R nephrostomy  tube cultures  and as noted now the deep pelvic culture is growing few yeast from 10/1) - if he does spike fever or significant leukocytosis, needs blood cultures - evaluated by GI on 10/2; tentative plan is for CLN the day prior to fistula repair; timing of this is still to be determined   Bilateral ureteral obstruction s/p perc nephrosotmy & stenting -Bilateral nephrostomy tubes to remain in place per urology until definitive repair of colovesical fistula -Also recommendations per urology are for IR to internalize right ureteral stent in preparation for fistula repair in order to identify ureters intraoperatively  History of CVA (cerebrovascular accident) -CVA on 10/18/2019 MRI.  Small bilateral acute cerebral and cerebellar infarcts.  Residual left hemiparesis  Electrolyte abnormality Hyperkalemia, hypokalemia, hypocalcemia, hyperphosphatemia -Treating appropriately as indicated per each - iPTH = 50 - continue sevelamer - IVF changed to LR per nephrology   Candida UTI - recent urological interventions, dysuria although he is afebrile and without leukocytosis. Previous urine cultures had shown E. coli and Pseudomonas. Patient was started on IV Zosyn. However urine culture then showed 70 K colonies of yeast, speciated to C. Albicans - remains on fluconazole; ID now formally consulted, continue to follow recommendations  BMI 24.0-24.9, adult - continue supplements and MVI  Anxiety and depression - resume cymbalta if renal function returns near baseline  ABLA (acute blood loss anemia) - see microcytic anemia  Microcytic anemia - has required 2 units PRBC so far; Hgb still slowly downtrending - continue to monitor and will transfuse further as needed  Increased anion gap metabolic acidosis Bicarbonate drip discontinued and started oral bicarbonate 9/27. - also on LR  ATN (acute tubular necrosis) (HCC) - see AoCKDIV  AKI (acute kidney injury) ([place]) - see  AoCKDIV   Antimicrobials: Zosyn 12/25/2019>>9/27 Fluconazole 9/27>> present  Meropenem 10/1>> present   DVT prophylaxis: SCD Code Status: Full Family Communication: None present Disposition Plan: Status is: Inpatient  Remains inpatient appropriate because:Ongoing diagnostic testing needed not appropriate for outpatient work up, IV treatments appropriate due to intensity of illness or inability to take PO and Inpatient level of care appropriate due to severity of illness   Dispo: The patient is from: Home              Anticipated d/c is to: SNF              Anticipated d/c date is: > 3 days              Patient currently is not medically stable to d/c.  Objective: Blood pressure 127/68, pulse (!) 47, temperature 98.8 F (37.1 C), temperature source Oral, resp. rate 16, height 5\' 3"  (1.6 m), weight 61.8 kg, SpO2 98 %.  Examination: General appearance: alert, cooperative and no distress Head: Normocephalic, without obvious abnormality, atraumatic Eyes: EOMI Lungs: clear to auscultation bilaterally Heart: regular rate and rhythm and S1, S2 normal Abdomen: normal findings: bowel sounds normal and soft, non-tender and Left nephrostomy tube noted with bloody urine.  Right nephrostomy tube clear urine and Foley catheter clear urine. Pelvic abscess bulb noted with serosanguinous fluid as well Extremities: no edema Skin: mobility and turgor normal Neurologic: no focal deficits  Consultants:   Nephrology  Urology  General surgery  IR  ID  GI   Data Reviewed: I have personally reviewed following labs and imaging studies Results for orders placed or performed during the hospital encounter of 12/24/19 (from the past 24 hour(s))  Prealbumin     Status: Abnormal   Collection  Time: 01/03/20  5:05 AM  Result Value Ref Range   Prealbumin 17.1 (L) 18 - 38 mg/dL  Renal function panel     Status: Abnormal   Collection Time: 01/03/20  5:06 AM  Result Value Ref Range   Sodium 143  135 - 145 mmol/L   Potassium 3.9 3.5 - 5.1 mmol/L   Chloride 112 (H) 98 - 111 mmol/L   CO2 22 22 - 32 mmol/L   Glucose, Bld 81 70 - 99 mg/dL   BUN 53 (H) 6 - 20 mg/dL   Creatinine, Ser 6.42 (H) 0.61 - 1.24 mg/dL   Calcium 7.5 (L) 8.9 - 10.3 mg/dL   Phosphorus 5.6 (H) 2.5 - 4.6 mg/dL   Albumin 1.9 (L) 3.5 - 5.0 g/dL   GFR calc non Af Amer 9 (L) >60 mL/min   GFR calc Af Amer 10 (L) >60 mL/min   Anion gap 9 5 - 15  CBC with Differential/Platelet     Status: Abnormal   Collection Time: 01/03/20  5:06 AM  Result Value Ref Range   WBC 8.6 4.0 - 10.5 K/uL   RBC 2.91 (L) 4.22 - 5.81 MIL/uL   Hemoglobin 8.9 (L) 13.0 - 17.0 g/dL   HCT 27.8 (L) 39 - 52 %   MCV 95.5 80.0 - 100.0 fL   MCH 30.6 26.0 - 34.0 pg   MCHC 32.0 30.0 - 36.0 g/dL   RDW 17.4 (H) 11.5 - 15.5 %   Platelets 164 150 - 400 K/uL   nRBC 0.0 0.0 - 0.2 %   Neutrophils Relative % 66 %   Neutro Abs 5.8 1.7 - 7.7 K/uL   Lymphocytes Relative 19 %   Lymphs Abs 1.7 0.7 - 4.0 K/uL   Monocytes Relative 7 %   Monocytes Absolute 0.6 0 - 1 K/uL   Eosinophils Relative 6 %   Eosinophils Absolute 0.5 0 - 0 K/uL   Basophils Relative 1 %   Basophils Absolute 0.0 0 - 0 K/uL   Immature Granulocytes 1 %   Abs Immature Granulocytes 0.04 0.00 - 0.07 K/uL  Magnesium     Status: None   Collection Time: 01/03/20  5:06 AM  Result Value Ref Range   Magnesium 2.0 1.7 - 2.4 mg/dL    Recent Results (from the past 240 hour(s))  Aerobic/Anaerobic Culture (surgical/deep wound)     Status: None   Collection Time: 12/27/19  4:26 PM   Specimen: Abscess  Result Value Ref Range Status   Specimen Description   Final    ABSCESS LEFT KIDNEY Performed at [facility] : [facility], [address] [place], [place] [address]    Special Requests   Final    NONE Performed at [facility], [place] [address] [place], [place] [address]    Gram Stain   Final    MODERATE WBC PRESENT, PREDOMINANTLY MONONUCLEAR FEW YEAST    Culture    Final    FEW CANDIDA ALBICANS NO ANAEROBES ISOLATED Performed at [facility], [place] [address] [place], [place] [address]    Report Status 01/01/2020 FINAL  Final  Aerobic/Anaerobic Culture (surgical/deep wound)     Status: None   Collection Time: 12/27/19  4:36 PM   Specimen: Abscess  Result Value Ref Range Status   Specimen Description   Final    ABSCESS RIGHT KIDNEY Performed at [facility] [address] [place], [place] [address]    Special Requests   Final  NONE Performed at [facility], [place] [address] [place], [place] [address]    Gram Stain   Final    RARE WBC PRESENT,BOTH PMN AND MONONUCLEAR NO ORGANISMS SEEN    Culture   Final    FEW CANDIDA ALBICANS NO ANAEROBES ISOLATED Performed at [facility], [place] [address] [place], [place] [address]    Report Status 01/02/2020 FINAL  Final  Aerobic/Anaerobic Culture (surgical/deep wound)     Status: None (Preliminary result)   Collection Time: 12/31/19  1:06 PM   Specimen: Abscess  Result Value Ref Range Status   Specimen Description   Final    ABSCESS Performed at [facility] [address] [place], [place] [address]    Special Requests   Final    Normal Performed at [facility], [place] [address] [place], [place] [address]    Gram Stain   Final    MANY WBC PRESENT, PREDOMINANTLY PMN NO ORGANISMS SEEN Performed at [facility], [place] [address] [place],  [address]    Culture   Final    FEW CANDIDA ALBICANS NO ANAEROBES ISOLATED; CULTURE IN PROGRESS FOR 5 DAYS    Report Status PENDING  Incomplete     Radiology Studies: No results found. CT IMAGE GUIDED DRAINAGE BY PERCUTANEOUS CATHETER  Final Result    US PELVIS LIMITED (TRANSABDOMINAL ONLY)  Final Result    CT ABDOMEN PELVIS WO CONTRAST  Final Result    IR NEPHROSTOMY PLACEMENT RIGHT  Final Result    IR NEPHROSTOMY PLACEMENT LEFT  Final  Result    US RENAL  Final Result    DG C-Arm 1-60 Min-No Report  Final Result    CT Renal Stone Study  Final Result  Addendum 1 of 1  ADDENDUM REPORT: 12/24/2019 12:35    ADDENDUM:  The possibility of a leak in the posterior urinary bladder in this  patient thought to have a fistula was again discussed with the  provider. Additionally, the presence of marked bilateral  hydronephrosis, worse on the RIGHT also was discussed. Perhaps a  cystogram prior to stented exchange if will not significantly delay  care could be helpful for further management as clinically  warranted. This would avoid the confounding effect of contrast which  could be introduced during stent exchange. Urologic consultation was  suggested.    These results were called by telephone at the time of interpretation  on 12/24/2019 at 12:34 pm to provider [facility] , who verbally  acknowledged these results.      Electronically Signed    By: Zetta Bills M.D.    On: 12/24/2019 12:35      Final      Scheduled Meds: . sodium chloride   Intravenous Once  . bisacodyl  10 mg Rectal Daily  . Chlorhexidine Gluconate Cloth  6 each Topical Daily  . feeding supplement  1 Container Oral TID BM  . fluconazole  200 mg Oral Daily  . gabapentin  200 mg Oral Daily  . multivitamin with minerals  1 tablet Oral Daily  . nicotine  14 mg Transdermal Daily  . sevelamer carbonate  800 mg Oral TID WC  . sodium bicarbonate  1,300 mg Oral BID  . sodium chloride flush  5 mL Intracatheter Q8H  . sodium chloride flush  5 mL Intracatheter Q8H  . tamsulosin  0.4 mg Oral Daily   PRN Meds: acetaminophen, HYDROcodone-acetaminophen, hydrOXYzine, ondansetron Continuous Infusions: . lactated  ringers 100 mL/hr at 01/03/20 0840  . meropenem (MERREM) IV 500 mg (01/03/20 1022)      LOS: 10 days  Time spent: Greater than 50% of the 35 minute visit was spent in counseling/coordination of care for the patient as laid out in the A&P.    Dwyane Dee, MD [facility] 01/03/2020, 2:55 PM  Contact via secure chat.  To contact the attending provider between 7A-7P or the covering provider during after hours 7P-7A, please log into the web site [email or web address] and access using universal [place] password for that web site. If you do not have the password, please call the hospital operator.

## 2020-01-03 NOTE — Progress Notes (Signed)
Referring Physician(s): Gross,S/Bell,E  Supervising Physician: Arne Cleveland  Patient Status:  [facility] - In-pt  Chief Complaint: Flank pain,bilateral hydronephrosis, recurrent pelvic fluid collection   Subjective: Patient doing okay today; no acute changes; eager to get back to rehab   Allergies: Nsaids  Medications: Prior to Admission medications   Medication Sig Start Date End Date Taking? Authorizing Provider  cyanocobalamin (,VITAMIN B-12,) 1000 MCG/ML injection Inject 1,000 mcg into the muscle every 30 (thirty) days.  12/28/13  Yes [provider]  docusate sodium (COLACE) 100 MG capsule Take 1 capsule (100 mg total) by mouth 2 (two) times daily as needed for mild constipation. 11/19/19  Yes Antonieta Pert, MD  DULoxetine (CYMBALTA) 30 MG capsule Take 30-60 mg by mouth See admin instructions. Take 2 capsules in the morning and 1 capsule in the evening 08/12/17  Yes [provider]  gabapentin (NEURONTIN) 400 MG capsule Take 800 mg by mouth at bedtime.   Yes [provider]  HYDROcodone-acetaminophen (NORCO/VICODIN) 5-325 MG tablet Take 1-2 tablets by mouth every 4 (four) hours as needed for up to 10 doses for moderate pain or severe pain (1 moderate, 2 severe). 11/19/19  Yes Antonieta Pert, MD  hydrOXYzine (ATARAX/VISTARIL) 10 MG tablet Take 1 tablet (10 mg total) by mouth 3 (three) times daily as needed for itching or anxiety. 10/26/19  Yes Mikhail, Maryann, DO  midodrine (PROAMATINE) 2.5 MG tablet Take 2 tablets (5 mg total) by mouth 3 (three) times daily with meals. Patient taking differently: Take 2.5-5 mg by mouth 3 (three) times daily as needed (hypotension).  11/19/19  Yes Antonieta Pert, MD  Multiple Vitamin (MULTIVITAMIN WITH MINERALS) TABS tablet Take 1 tablet by mouth daily. 10/27/19  Yes Mikhail, Velta Addison, DO  tamsulosin (FLOMAX) 0.4 MG CAPS capsule Take 0.4 mg by mouth daily.    Yes [provider]  feeding supplement, ENSURE ENLIVE, (ENSURE  ENLIVE) LIQD Take 237 mLs by mouth 3 (three) times daily between meals. Patient not taking: Reported on 12/24/2019 10/26/19   Cristal Ford, DO     Vital Signs: BP 127/68 (BP Location: Left Arm)   Pulse (!) 47   Temp 98.8 F (37.1 C) (Oral)   Resp 16   Ht 5\' 3"  (1.6 m)   Wt 136 lb 4.8 oz (61.8 kg)   SpO2 98%   BMI 24.14 kg/m   Physical Exam awake, alert.  Left and right nephrostomies as well as transgluteal drain intact; output of left nephrostomy approximately 2 L yellow urine, right nephrostomy 1 L slightly blood-tinged urine, transgluteal drain 25 cc serosanguineous fluid.  Imaging: CT ABDOMEN PELVIS WO CONTRAST  Result Date: 12/30/2019 CLINICAL DATA:  Abdominal pain. EXAM: CT ABDOMEN AND PELVIS WITHOUT CONTRAST TECHNIQUE: Multidetector CT imaging of the abdomen and pelvis was performed following the standard protocol without IV contrast. COMPARISON:  December 24, 2019. FINDINGS: Lower chest: Small bilateral pleural effusions are noted with adjacent subsegmental atelectasis. Hepatobiliary: No focal liver abnormality is seen. Status post cholecystectomy. No biliary dilatation. Pancreas: Unremarkable. No pancreatic ductal dilatation or surrounding inflammatory changes. Spleen: Normal in size without focal abnormality. Adrenals/Urinary Tract: Adrenal glands appear normal. Interval placement of bilateral percutaneous nephrostomies. Left ureteral stent is in grossly good position. Right ureteral stent noted on prior exam has been removed. No definite hydronephrosis or renal obstruction is seen at this time. Foley catheter is noted within the urinary bladder. Perivesical fluid collection is again noted, which currently measures 6.0 x 4.3 cm in the transverse orientation which  may be slightly enlarged compared to prior exam. Fluid within urinary bladder appears to be high density suggesting possible hemorrhage. Stomach/Bowel: Status post gastric bypass. There is no evidence of bowel obstruction  or inflammation. The appendix appears normal. Vascular/Lymphatic: Aortic atherosclerosis. No enlarged abdominal or pelvic lymph nodes. Reproductive: Prostate is unremarkable. Other: Mild anasarca is noted.  No definite hernia is noted. Musculoskeletal: No acute or significant osseous findings. IMPRESSION: 1. Interval placement of bilateral percutaneous nephrostomies. Left ureteral stent is in grossly good position. Right ureteral stent noted on prior exam has been removed. No definite hydronephrosis or renal obstruction is seen at this time. 2. Perivesical fluid collection is again noted, which currently measures 6.0 x 4.3 cm in the transverse orientation which may be slightly enlarged compared to prior exam. Fluid within urinary bladder appears to be high density suggesting possible hemorrhage. 3. Small bilateral pleural effusions are noted with adjacent subsegmental atelectasis. 4. Mild anasarca. 5. Aortic atherosclerosis. Aortic Atherosclerosis (ICD10-I70.0). Electronically Signed   By: Marijo Conception M.D.   On: 12/30/2019 12:47   US PELVIS LIMITED (TRANSABDOMINAL ONLY)  Result Date: 12/30/2019 CLINICAL DATA:  Pelvic fluid collection. EXAM: LIMITED ULTRASOUND OF PELVIS TECHNIQUE: Limited transabdominal ultrasound examination of the pelvis was performed. COMPARISON:  Noncontrast CT earlier this day. Multiple prior abdominopelvic CT. FINDINGS: Foley catheter decompresses the urinary bladder. Posterior to the catheter balloon is heterogeneous 5.3 x 3.0 x 5.2 cm avascular lesion, corresponding to that seen on CT. There is no internal or peripheral vascularity. There is a small amount of free fluid in the pelvis. IMPRESSION: 1. Heterogeneous lesion in the bladder wall posterior to the Foley catheter balloon measuring 5.3 x 3.0 x 5.2 cm. It is unclear if this represents a solid mass or walled off complex fluid collection. 2. Small amount of simple free fluid in the pelvis. Electronically Signed   By: Keith Rake M.D.   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 EXAM: CT-guided drain placement MEDICATIONS: The patient is currently admitted to the hospital and receiving intravenous antibiotics. The antibiotics were administered within an appropriate time frame prior to the initiation of the procedure. ANESTHESIA/SEDATION: Fentanyl 100 mcg IV; Versed 2 mg IV Moderate Sedation Time:  21 minutes The patient was continuously monitored during the procedure by the interventional radiology nurse under my direct supervision. COMPLICATIONS: None immediate. PROCEDURE: Informed written consent was obtained from the patient after a thorough discussion of the procedural risks, benefits and alternatives. All questions were addressed. Maximal Sterile Barrier Technique was utilized including caps, mask, sterile gowns, sterile gloves, sterile drape, hand hygiene and skin antiseptic. A timeout was performed prior to the initiation of the procedure. A planning axial CT scan was performed. The fluid collection in the retro vesicular space was successfully identified. A suitable skin entry site was selected to allow for a left transgluteal/parasacral approach. The overlying skin was sterilely prepped and draped in the standard fashion using chlorhexidine skin prep. Local anesthesia was attained by infiltration with 1% lidocaine. A small dermatotomy was made. Under intermittent CT guidance, an 18 gauge trocar needle was carefully advanced adjacent to the rectum and into the suspected fluid collection. A 0.35 wire was coiled in the fluid collection. The needle was exchanged for a fascial dilator and the soft tissue tract was dilated to 12 [place]. A Cook 12 [place] all-purpose drainage  catheter was then advanced over the wire and formed in the fluid collection.  Aspiration yields approximately 80 mL of thick, purulent and foul-smelling fluid. A sample was sent for Gram stain and culture. The drainage catheter was flushed and connected to JP bulb suction before being secured to the skin with 0 Prolene suture. The patient tolerated the procedure well. IMPRESSION: Successful placement of a left transgluteal approach 12 French drainage catheter into the retro vesicular fluid collection. Aspiration yields thick, foul-smelling purulent fluid. PLAN: 1. Maintain drain to JP bulb suction. 2. Flush drainage catheter daily. 3. Drainage catheter will likely remain in place until the time of definitive surgery. Electronically Signed   By: Jacqulynn Cadet M.D.   On: 12/31/2019 16:23    Labs:  CBC: Recent Labs    12/31/19 0528 01/01/20 0430 01/02/20 0427 01/03/20 0506  WBC 8.2 9.7 9.8 8.6  HGB 8.7* 8.5* 7.6* 8.9*  HCT 26.9* 27.1* 24.3* 27.8*  PLT 215 188 177 164    COAGS: Recent Labs    10/17/19 1241 10/17/19 1241 10/17/19 1311 10/17/19 1311 11/07/19 1836 11/07/19 1853 11/10/19 1052 12/08/19 1258 12/27/19 0450  INR 1.5*   < > 1.6*   < > 1.2  --  1.3* 1.1 1.3*  APTT 43*  --  46*  --   --  31  --   --   --    < > = values in this interval not displayed.    BMP: Recent Labs    12/31/19 0528 01/01/20 0430 01/02/20 0427 01/03/20 0506  NA 145 143 144 143  K 3.5 3.5 3.9 3.9  CL 113* 116* 113* 112*  CO2 18* 18* 19* 22  GLUCOSE 92 73 84 81  BUN 68* 60* 61* 53*  CALCIUM 7.2* 7.3* 7.5* 7.5*  CREATININE 6.89* 6.54* 6.27* 6.42*  GFRNONAA 8* 9* 9* 9*  GFRAA 9* 10* 11* 10*    LIVER FUNCTION TESTS: Recent Labs    11/07/19 1836 11/07/19 1836 11/10/19 0932 11/10/19 0932 12/25/19 0217 12/25/19 0217 12/28/19 0503 12/30/19 0603 12/31/19 0528 01/01/20 0430 01/02/20 0427 01/03/20 0506  BILITOT 0.6  --  0.4  --  0.3  --  0.3  --   --   --   --   --   AST 36  --  17  --  44*  --  28  --   --   --   --   --   ALT 53*  --  41  --  76*  --  8   --   --   --   --   --   ALKPHOS 180*  --  111  --  122  --  87  --   --   --   --   --   PROT 7.5  --  5.6*  --  6.7  --  6.0*  --   --   --   --   --   ALBUMIN 2.7*   < > 1.8*   < > 2.7*   < > 2.2*   < > 1.9* 2.0* 1.9* 1.9*   < > = values in this interval not displayed.    Assessment and Plan: Patient with history of Hodgkin's lymphoma/BPH/colovesical fistula, chronic kidney disease, bladder outlet obstruction, funguria, obstructive bilateral hydronephrosis despite bilateral ureteral stents- right stent removed 9/25, status post bilateral nephrostomies on 9/27; prior drainage of pelvic fluid collection on 11/15/19 with neg fistulagram and drain removal  on 9/3; now with CT A/P findings  of:  1. Interval placement of bilateral percutaneous nephrostomies. Left ureteral stent is in grossly good position. Right ureteral stent noted on prior exam has been removed. No definite hydronephrosis or renal obstruction is seen at this time. 2. Perivesical fluid collection is again noted, which currently measures 6.0 x 4.3 cm in the transverse orientation which may be slightly enlarged compared to prior exam. Fluid within urinary bladder appears to be high density suggesting possible hemorrhage. 3. Small bilateral pleural effusions are noted with adjacent subsegmental atelectasis. 4. Mild anasarca. 5. Aortic atherosclerosis.   Status post left transgluteal approach 12 French drain cath placement into retrovesicular fluid collection on 10/1; afebrile, WBC normal, hemoglobin 8.9 up from 7.6, creatinine 6.42, pelvic fluid cultures pending, showed few yeast; continue drain irrigations and output monitoring; patient to maintain all drains for now until planned surgical intervention; will need attempted right ureteral stent placement closer to time of surgery   Electronically Signed: D. Rowe Robert, PA-C 01/03/2020, 11:33 AM   I spent a total of 15 minutes at the the patient's bedside AND on the patient's  hospital floor or unit, greater than 50% of which was counseling/coordinating care for bilateral nephrostomies and pelvic fluid collection drain    Patient ID: Chase [name], male   DOB: 13-Mar-1966, 54 y.o.   MRN: [id number]

## 2020-01-03 NOTE — Progress Notes (Signed)
9 Days Post-Op   Subjective/Chief Complaint: Feels better. Anxious to get back to rehab   Objective: Vital signs in last 24 hours: Temp:  [97.8 F (36.6 C)-98.8 F (37.1 C)] 98.8 F (37.1 C) (10/04 0635) Pulse Rate:  [44-60] 47 (10/04 0635) Resp:  [13-16] 16 (10/04 0635) BP: (105-143)/(57-79) 127/68 (10/04 0635) SpO2:  [96 %-100 %] 98 % (10/04 0635) Last BM Date: 01/01/20  Intake/Output from previous day: 10/03 0701 - 10/04 0700 In: 3563.5 [P.O.:720; I.V.:2143.5; Blood:400; IV Piggyback:300] Out: 8676 [Urine:3300; Drains:25; Stool:2] Intake/Output this shift: No intake/output data recorded.  General appearance: alert and cooperative Resp: clear to auscultation bilaterally Cardio: regular rate and rhythm GI: soft, mild tenderness. perc drains clearing  Lab Results:  Recent Labs    01/02/20 0427 01/03/20 0506  WBC 9.8 8.6  HGB 7.6* 8.9*  HCT 24.3* 27.8*  PLT 177 164   BMET Recent Labs    01/02/20 0427 01/03/20 0506  NA 144 143  K 3.9 3.9  CL 113* 112*  CO2 19* 22  GLUCOSE 84 81  BUN 61* 53*  CREATININE 6.27* 6.42*  CALCIUM 7.5* 7.5*   PT/INR No results for input(s): LABPROT, INR in the last 72 hours. ABG No results for input(s): PHART, HCO3 in the last 72 hours.  Invalid input(s): PCO2, PO2  Studies/Results: No results found.  Anti-infectives: Anti-infectives (From admission, onward)   Start     Dose/Rate Route Frequency Ordered Stop   01/01/20 1000  fluconazole (DIFLUCAN) tablet 200 mg        200 mg Oral Daily 12/31/19 1618 01/15/20 0959   12/31/19 1800  meropenem (MERREM) 500 mg in sodium chloride 0.9 % 100 mL IVPB        500 mg 200 mL/hr over 30 Minutes Intravenous Every 12 hours 12/31/19 1545 01/14/20 2159   12/27/19 1541  ciprofloxacin (CIPRO) 400 MG/200ML IVPB       Note to Pharmacy: Hilma Favors   : cabinet override      12/27/19 1541 12/28/19 0741   12/27/19 1333  fluconazole (DIFLUCAN) tablet 200 mg        200 mg Oral Daily 12/27/19  1333 12/31/19 0913   12/27/19 1300  fluconazole (DIFLUCAN) tablet 400 mg  Status:  Discontinued        400 mg Oral Daily 12/27/19 1213 12/27/19 1214   12/27/19 1300  fluconazole (DIFLUCAN) tablet 200 mg  Status:  Discontinued        200 mg Oral Daily 12/27/19 1214 12/27/19 1333   12/25/19 1600  piperacillin-tazobactam (ZOSYN) IVPB 2.25 g  Status:  Discontinued        2.25 g 100 mL/hr over 30 Minutes Intravenous Every 8 hours 12/25/19 1226 12/27/19 1213   12/25/19 1132  ceFAZolin (ANCEF) 2-4 GM/100ML-% IVPB       Note to Pharmacy: [place]  : cabinet override      12/25/19 1132 12/25/19 2344      Assessment/Plan: s/p Procedure(s): CYSTOSCOPY WITH bilateral  RETROGRADE PYELOGRAM/ leftURETERAL STENT PLACEMENT (Bilateral) Advance diet  At some point, the patient would benefit from resection the rectosigmoid colon and bladder repair of the colovesical fistula.  The tentative plan was to wait for infection to go down, renal function to improve, and rehabilitation from his stroke.  With his readmission, surgery has been set back.  However he already looks more bright and alert and hemiparesis seems to be markedly resolved since last months admission (despite the worsening ARF/CKD).  I would be  inclined to keep him on antibiotics until surgery.  Continue fluconazole per infectious disease.  Meropenem for now.  Follow-up on cultures.   Therapy includes MiraLAX to help clean out his severe constipation as possible and also anticipation of colonoscopy or flexible sigmoidoscopy this hospitalization  Patient needs a colonoscopy at some point.  Even a flexible sigmoidoscopy would be helpful.  If there are no surprises on his CAT scan, consider gastroenterology consult for endoscopy and biopsies this admission since no evidence of perf.  Make sure this is not a malignancy also suspected on MRI of the pelvis.  May at outside institution.  Patient thinks he has had a colonoscopy in the past  year through [place] in [place].  However he admits his memory is not great since the stroke..  That could alter treatment plan.    If it is diverticular etiology as we assume, then hopefully would plan robotic sigmoid colectomy with repair of bladder for colovesical fistula.  Ideally would wait 6 weeks after resolution of any abscess.  Low threshold for him to have a Hartmann resection or diverting colostomy if he worsens / fails to improve this admission with another operation in the future.  Another possibility would be immediate anastomosis with diverting ileostomy.  That will have to be determined on the day of surgery.  Patient's biggest concern is losing his rehab/SNF spot at [facility] since he has enjoyed working with rehab through them & recovering from his stroke & ARF/CKD with severe deconditioning.  He feels like he is made a lot of strides with PT etc, and he is worried about losing his spot sponsored by the [place] contract due to this unexpected readmission.  Would be like going/colonoscopy hospitalization.  For case management and social work to help work with him - they are following closely.  I do agree that rehab is essential for minimal morbidity/mortality.  VTE prophylaxis- SCDs, etc  Mobilize as tolerated to help recovery  LOS: 10 days    Autumn Messing III 01/03/2020

## 2020-01-03 NOTE — Progress Notes (Signed)
[place] [facility] for Infectious Disease    Date of Admission:  12/24/2019   Total days of antibiotics 10/day8 fluc/day 4 mero           ID: Chase [name] is a 54 y.o. male with  Principal Problem:   Acute renal failure superimposed on stage 4 chronic kidney disease (HCC) Active Problems:   CKD (chronic kidney disease), stage IV (HCC)   Chronic pain   Cerebral embolism with cerebral infarction   Protein-calorie malnutrition, severe   BPH (benign prostatic hyperplasia)   Colovesical fistula   Pelvic abscess with colovescial fistula   Anemia   History of COVID-19   History of gastric bypass   Iron deficiency anemia   Pernicious anemia   PTSD (post-traumatic stress disorder)   AKI (acute kidney injury) ([place])   Bilateral ureteral obstruction s/p perc nephrosotmy & stenting   ATN (acute tubular necrosis) (HCC)   Candida UTI   Electrolyte abnormality   Increased anion gap metabolic acidosis   Microcytic anemia   ABLA (acute blood loss anemia)   Anxiety and depression   History of CVA (cerebrovascular accident)   BMI 24.0-24.9, adult   Chronic narcotic use   Anemia in chronic kidney disease    Subjective: Afebrile, feeling better, looking forward to going to back rehab. He noticed blood tinged urine in nephrostomy tube. serosanginous fluid in drain  12 point ros is otherwise negative  Medications:  . sodium chloride   Intravenous Once  . bisacodyl  10 mg Rectal Daily  . Chlorhexidine Gluconate Cloth  6 each Topical Daily  . feeding supplement  1 Container Oral TID BM  . fluconazole  200 mg Oral Daily  . gabapentin  200 mg Oral Daily  . multivitamin with minerals  1 tablet Oral Daily  . nicotine  14 mg Transdermal Daily  . sevelamer carbonate  800 mg Oral TID WC  . sodium bicarbonate  1,300 mg Oral BID  . sodium chloride flush  5 mL Intracatheter Q8H  . sodium chloride flush  5 mL Intracatheter Q8H  . tamsulosin  0.4 mg Oral Daily    Objective: Vital signs  in last 24 hours: Temp:  [98.4 F (36.9 C)-99.1 F (37.3 C)] 99.1 F (37.3 C) (10/04 1542) Pulse Rate:  [44-65] 65 (10/04 1542) Resp:  [13-16] 16 (10/04 0635) BP: (105-136)/(57-79) 130/73 (10/04 1542) SpO2:  [96 %-100 %] 97 % (10/04 1542) Physical Exam  Constitutional: He is oriented to person, place, and time. He appears well-developed and well-nourished. No distress.  HENT:  Mouth/Throat: Oropharynx is clear and moist. No oropharyngeal exudate.  Cardiovascular: Normal rate, regular rhythm and normal heart sounds. Exam reveals no gallop and no friction rub.  No murmur heard.  Pulmonary/Chest: Effort normal and breath sounds normal. No respiratory distress. He has no wheezes.  Abdominal: Soft. Bowel sounds are normal. He exhibits no distension. There is no tenderness.  Nephrostomy - clear urine / jp drain - serosanginous Lymphadenopathy:  He has no cervical adenopathy.  Neurological: He is alert and oriented to person, place, and time.  Skin: Skin is warm and dry. No rash noted. No erythema.  Psychiatric: He has a normal mood and affect. His behavior is normal.    Lab Results Recent Labs    01/02/20 0427 01/03/20 0506  WBC 9.8 8.6  HGB 7.6* 8.9*  HCT 24.3* 27.8*  NA 144 143  K 3.9 3.9  CL 113* 112*  CO2 19* 22  BUN  61* 53*  CREATININE 6.27* 6.42*   Liver Panel Recent Labs    01/02/20 0427 01/03/20 0506  ALBUMIN 1.9* 1.9*    Microbiology: reviewed Studies/Results: No results found.   Assessment/Plan: Polymicrobial pelvic abscess with colovesical fistula = will plan to treat with 14 day of fluconazole plus meropenem. Will place OPAT order in place -will need IR to place central line to receive IV meropenem given CKD 4  ckd 4 = medications aer renally dosed.   Will set up appt with dr Juleen China as outpatient   [facility] for Infectious Diseases Cell: [phone] Pager: [phone]  01/03/2020, 5:24 PM

## 2020-01-03 NOTE — Progress Notes (Addendum)
PHARMACY CONSULT NOTE FOR:  OUTPATIENT  PARENTERAL ANTIBIOTIC THERAPY (OPAT)  Indication: Pelvic Abscess Regimen: meropenem 500mg  IV q12h End date: 01/14/2020   Patient will also be on fluconazole 200mg  PO daily until 01/14/2020  IV antibiotic discharge orders are pended. To discharging provider:  please sign these orders via discharge navigator,  Select New Orders & click on the button choice - Manage This Unsigned Work.     Thank you for allowing pharmacy to be a part of this patient's care.  Phillis Haggis 01/03/2020, 5:03 PM

## 2020-01-04 DIAGNOSIS — K432 Incisional hernia without obstruction or gangrene: Secondary | ICD-10-CM

## 2020-01-04 HISTORY — DX: Incisional hernia without obstruction or gangrene: K43.2

## 2020-01-04 LAB — CBC WITH DIFFERENTIAL/PLATELET
Abs Immature Granulocytes: 0.05 10*3/uL (ref 0.00–0.07)
Basophils Absolute: 0 10*3/uL (ref 0.0–0.1)
Basophils Relative: 1 %
Eosinophils Absolute: 0.6 10*3/uL — ABNORMAL HIGH (ref 0.0–0.5)
Eosinophils Relative: 8 %
HCT: 27.9 % — ABNORMAL LOW (ref 39.0–52.0)
Hemoglobin: 8.8 g/dL — ABNORMAL LOW (ref 13.0–17.0)
Immature Granulocytes: 1 %
Lymphocytes Relative: 13 %
Lymphs Abs: 1 10*3/uL (ref 0.7–4.0)
MCH: 30.3 pg (ref 26.0–34.0)
MCHC: 31.5 g/dL (ref 30.0–36.0)
MCV: 96.2 fL (ref 80.0–100.0)
Monocytes Absolute: 0.5 10*3/uL (ref 0.1–1.0)
Monocytes Relative: 6 %
Neutro Abs: 5.8 10*3/uL (ref 1.7–7.7)
Neutrophils Relative %: 71 %
Platelets: 161 10*3/uL (ref 150–400)
RBC: 2.9 MIL/uL — ABNORMAL LOW (ref 4.22–5.81)
RDW: 17.2 % — ABNORMAL HIGH (ref 11.5–15.5)
WBC: 8 10*3/uL (ref 4.0–10.5)
nRBC: 0 % (ref 0.0–0.2)

## 2020-01-04 LAB — RENAL FUNCTION PANEL
Albumin: 1.9 g/dL — ABNORMAL LOW (ref 3.5–5.0)
Anion gap: 8 (ref 5–15)
BUN: 47 mg/dL — ABNORMAL HIGH (ref 6–20)
CO2: 23 mmol/L (ref 22–32)
Calcium: 7.4 mg/dL — ABNORMAL LOW (ref 8.9–10.3)
Chloride: 112 mmol/L — ABNORMAL HIGH (ref 98–111)
Creatinine, Ser: 5.74 mg/dL — ABNORMAL HIGH (ref 0.61–1.24)
GFR calc Af Amer: 12 mL/min — ABNORMAL LOW (ref >60)
GFR calc non Af Amer: 10 mL/min — ABNORMAL LOW (ref >60)
Glucose, Bld: 77 mg/dL (ref 70–99)
Phosphorus: 4.4 mg/dL (ref 2.5–4.6)
Potassium: 3.9 mmol/L (ref 3.5–5.1)
Sodium: 143 mmol/L (ref 135–145)

## 2020-01-04 LAB — MAGNESIUM: Magnesium: 2 mg/dL (ref 1.7–2.4)

## 2020-01-04 MED ORDER — POLYETHYLENE GLYCOL 3350 17 G PO PACK: 17.0000 g | PACK | Freq: Every day | ORAL | Status: DC | PRN

## 2020-01-04 MED ORDER — POLYETHYLENE GLYCOL 3350 17 G PO PACK: 17.0000 g | PACK | Freq: Every day | ORAL | Status: DC

## 2020-01-04 NOTE — Progress Notes (Signed)
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    CT abdomen on admission showed severe bilateral hydronephrosis and possible colovesical fistula. On admission creatinine 7.1 (baseline 2.4-2.8).   Admitted for acute on chronic kidney disease, severe anion gap metabolic acidosis, hyperkalemia, bilateral hydronephrosis.  Nephrology and urology consulting.  9/25, s/p left ureteral stent replaced but right ureteral stent unsuccessful.  Bilateral percutaneous nephrostomies placed by Chase [name] 12/27/19.  His creatinine peaked at 8.47 and started to show some signs of downtrending. Urine output remained adequate consistent with ATN/diuretic phase. He underwent repeat CT abdomen/pelvis on 12/30/2019 which revealed "Perivesical fluid collection is again noted, which currently measures 6.0 x 4.3 cm in the transverse orientation which may be slightly enlarged compared to prior exam".  Chase [name] was consulted for possible drain placement into fluid collection.  He underwent successful drain placement on 12/31/2019 and cultures were sent for testing. Infectious disease was also consulted and patient was started on meropenem and continued with fluconazole while cultures further matured. He was also evaluated by Chase [name] and tentative plans are for colonoscopy the day prior to fistula repair if able to be coordinated in that manner.   Interval  History:  No events overnight.   Pointing out a small hernia in her upper middle abdomen asking if that could be repaired when he has surgery. Otherwise no concerns.  The urine in his left nephrostomy tube has completely cleared now.  Old records reviewed in assessment of this patient  ROS: Constitutional: negative for chills and fevers, Respiratory: negative for cough, Cardiovascular: negative for chest pain and Gastrointestinal: negative for abdominal pain  Assessment & Plan: * Acute renal failure superimposed on stage 4 chronic kidney disease Chase [name]) Nephrology following. Baseline creatinine 2.4-2.8. Presented with creatinine of 7.1, potassium 5.5 and bicarbonate 7. CT abdomen showed bilateral severe hydronephrosis. Foley catheter in place.  -Creatinine appears to have peaked at 8.47 on 12/28/2019. Appears to be in his diuretic phase - On 9/25per Urologys/p left ureteral stent replaced but right ureteral stent unsuccessful due to tortuosity - per urology: "Would recommend Chase [name] to internalize a right ureteral stent at some point (not urgent) in preparation for repair of colovesical fistula to identify ureters intraoperatively"  - now s/p B/L PCN by Chase [name] on 9/27. Tubes to remain in place until fistula repair completed in future - Still holding off on dialysis and does not meet criteria. Appreciate nephrology assistance. Renal function has been improving daily.  -strict I&O -Diet liberalized per nephrology  Pelvic abscess with colovescial fistula - greatly appreciate multi-modality input (surgery, nephrology, urology, Chase [name], Chase [name], Chase [name]) - definitive repair still being determined; per surgery possibly remaining inpatient until surgery can take place - s/p Chase [name] pelvic abscess drain on 10/1 (culture growing "few yeast"); Chase [name] consulted; patient now on meropenem and fluconazole (C. Albicans growing in L/R nephrostomy tube cultures and as noted now the deep pelvic culture is growing few yeast from 10/1) -  if he  does spike fever or significant leukocytosis, needs blood cultures - evaluated by Chase [name] on 10/2; tentative plan is for CLN the day prior to fistula repair; timing of this is still to be determined  - need to clarify with surgery if plan is inpatient repair vs awaiting further renal recovery then outpatient repair; patient also asking if his hernia could be repaired at the same time  Bilateral ureteral obstruction s/p perc nephrosotmy & stenting -Bilateral nephrostomy tubes to remain in place per urology until definitive repair of colovesical fistula -Also recommendations per urology are for Chase [name] to internalize right ureteral stent in preparation for fistula repair in order to identify ureters intraoperatively -Indwelling Foley catheter to remain in place with catheter exchange every 4 to 6 weeks until definitive surgery for fistula and bladder repair  History of CVA (cerebrovascular accident) -CVA on 10/18/2019 MRI.  Small bilateral acute cerebral and cerebellar infarcts.  Residual left hemiparesis  Electrolyte abnormality Hyperkalemia, hypokalemia, hypocalcemia, hyperphosphatemia -Treating appropriately as indicated per each - iPTH = 50 - continue sevelamer - IVF changed to LR per nephrology   Candida UTI - recent urological interventions, dysuria although he is afebrile and without leukocytosis. Previous urine cultures had shown E. coli and Pseudomonas. Patient was started on IV Zosyn. However urine culture then showed 70 K colonies of yeast, speciated to C. Albicans - remains on fluconazole; Chase [name] now formally consulted, continue to follow recommendations - see Chase [name] note from 10/4; plan is to complete 14 day course of Fluconazole and meropenem; end date 01/14/20; if gets discharged prior to course completion needs Chase [name] to place CVL (cannot have PICC due to renal status)  BMI 24.0-24.9, adult - continue supplements and MVI  Anxiety and depression - resume cymbalta if renal function returns  near baseline  ABLA (acute blood loss anemia) - see microcytic anemia  Microcytic anemia - has required 2 units PRBC so far; Hgb still slowly downtrending - continue to monitor and will transfuse further as needed  Increased anion gap metabolic acidosis Bicarbonate drip discontinued and started oral bicarbonate 9/27. - also on LR  ATN (acute tubular necrosis) ([place]) - see AoCKDIV  AKI (acute kidney injury) ([place]) - see AoCKDIV   Antimicrobials: Zosyn 12/25/2019>>9/27 Fluconazole 9/27>> present.  End date 01/14/2020 Meropenem 10/1>> present.  Same end date  DVT prophylaxis: SCD Code Status: Full Family Communication: None present Disposition Plan: Status is: Inpatient  Remains inpatient appropriate because:Ongoing diagnostic testing needed not appropriate for outpatient work up, IV treatments appropriate due to intensity of illness or inability to take PO and Inpatient level of care appropriate due to severity of illness   Dispo: The patient is from: Home              Anticipated d/c is to: SNF              Anticipated d/c date is: > 3 days              Patient currently is not medically stable to d/c.  Objective: Blood pressure 120/76, pulse 73, temperature 98.8 F (37.1 C), temperature source Oral, resp. rate 18, height 5\' 3"  (1.6 m), weight 61.8 kg, SpO2 95 %.  Examination: General appearance: alert, cooperative and no distress Head: Normocephalic, without obvious abnormality, atraumatic Eyes: EOMI Lungs: clear to auscultation bilaterally Heart: regular rate and rhythm and S1, S2 normal Abdomen: normal findings: bowel sounds normal and soft, non-tender and Left and right nephrostomy tube clear urine and Foley catheter clear urine. Pelvic abscess  bulb noted with serosanguinous fluid as well Extremities: no edema Skin: mobility and turgor normal Neurologic: no focal deficits  Consultants:   Nephrology  Urology  General surgery  Chase [name]  Chase [name]  Chase [name]   Data  Reviewed: I have personally reviewed following labs and imaging studies Results for orders placed or performed during the hospital encounter of 12/24/19 (from the past 24 hour(s))  Renal function panel     Status: Abnormal   Collection Time: 01/04/20  4:53 AM  Result Value Ref Range   Sodium 143 135 - 145 mmol/L   Potassium 3.9 3.5 - 5.1 mmol/L   Chloride 112 (H) 98 - 111 mmol/L   CO2 23 22 - 32 mmol/L   Glucose, Bld 77 70 - 99 mg/dL   BUN 47 (H) 6 - 20 mg/dL   Creatinine, Ser 5.74 (H) 0.61 - 1.24 mg/dL   Calcium 7.4 (L) 8.9 - 10.3 mg/dL   Phosphorus 4.4 2.5 - 4.6 mg/dL   Albumin 1.9 (L) 3.5 - 5.0 g/dL   GFR calc non Af Amer 10 (L) >60 mL/min   GFR calc Af Amer 12 (L) >60 mL/min   Anion gap 8 5 - 15  CBC with Differential/Platelet     Status: Abnormal   Collection Time: 01/04/20  4:53 AM  Result Value Ref Range   WBC 8.0 4.0 - 10.5 K/uL   RBC 2.90 (L) 4.22 - 5.81 MIL/uL   Hemoglobin 8.8 (L) 13.0 - 17.0 g/dL   HCT 27.9 (L) 39 - 52 %   MCV 96.2 80.0 - 100.0 fL   MCH 30.3 26.0 - 34.0 pg   MCHC 31.5 30.0 - 36.0 g/dL   RDW 17.2 (H) 11.5 - 15.5 %   Platelets 161 150 - 400 K/uL   nRBC 0.0 0.0 - 0.2 %   Neutrophils Relative % 71 %   Neutro Abs 5.8 1.7 - 7.7 K/uL   Lymphocytes Relative 13 %   Lymphs Abs 1.0 0.7 - 4.0 K/uL   Monocytes Relative 6 %   Monocytes Absolute 0.5 0 - 1 K/uL   Eosinophils Relative 8 %   Eosinophils Absolute 0.6 (H) 0 - 0 K/uL   Basophils Relative 1 %   Basophils Absolute 0.0 0 - 0 K/uL   Immature Granulocytes 1 %   Abs Immature Granulocytes 0.05 0.00 - 0.07 K/uL  Magnesium     Status: None   Collection Time: 01/04/20  4:53 AM  Result Value Ref Range   Magnesium 2.0 1.7 - 2.4 mg/dL    Recent Results (from the past 240 hour(s))  Aerobic/Anaerobic Culture (surgical/deep wound)     Status: None   Collection Time: 12/27/19  4:26 PM   Specimen: Abscess  Result Value Ref Range Status   Specimen Description   Final    ABSCESS LEFT KIDNEY Performed at  [facility], [address] [place], [place] [address]    Special Requests   Final    NONE Performed at [facility] [address], [place], [place] [address]    Gram Stain   Final    MODERATE WBC PRESENT, PREDOMINANTLY MONONUCLEAR FEW YEAST    Culture   Final    FEW CANDIDA ALBICANS NO ANAEROBES ISOLATED Performed at [facility], [place] [address], [place],  [address]    Report Status 01/01/2020 FINAL  Final  Aerobic/Anaerobic Culture (surgical/deep wound)     Status: None   Collection Time: 12/27/19  4:36 PM  Specimen: Abscess  Result Value Ref Range Status   Specimen Description   Final    ABSCESS RIGHT KIDNEY Performed at [place] [address] [place], [place] [address]    Special Requests   Final    NONE Performed at [facility], [place] [address] [place], [place] [address]    Gram Stain   Final    RARE WBC PRESENT,BOTH PMN AND MONONUCLEAR NO ORGANISMS SEEN    Culture   Final    FEW CANDIDA ALBICANS NO ANAEROBES ISOLATED Performed at [facility], [place] [address], [place], [place] [address]    Report Status 01/02/2020 FINAL  Final  Aerobic/Anaerobic Culture (surgical/deep wound)     Status: None (Preliminary result)   Collection Time: 12/31/19  1:06 PM   Specimen: Abscess  Result Value Ref Range Status   Specimen Description   Final    ABSCESS Performed at [facility] [address] [place], [place] [address]    Special Requests   Final    Normal Performed at [name], [place] [address], [place], [place] [address]    Gram Stain   Final    MANY WBC PRESENT, PREDOMINANTLY PMN NO ORGANISMS SEEN Performed at [facility], [place] [address] [place], [place] [address]    Culture   Final    FEW CANDIDA ALBICANS NO ANAEROBES ISOLATED; CULTURE IN PROGRESS FOR 5 DAYS    Report Status PENDING  Incomplete      Radiology Studies: No results found. CT IMAGE GUIDED DRAINAGE BY PERCUTANEOUS CATHETER  Final Result    US PELVIS LIMITED (TRANSABDOMINAL ONLY)  Final Result    CT ABDOMEN PELVIS WO CONTRAST  Final Result    Chase [name] NEPHROSTOMY PLACEMENT RIGHT  Final Result    Chase [name] NEPHROSTOMY PLACEMENT LEFT  Final Result    US RENAL  Final Result    DG C-Arm 1-60 Min-No Report  Final Result    CT Renal Stone Study  Final Result  Addendum 1 of 1  ADDENDUM REPORT: 12/24/2019 12:35    ADDENDUM:  The possibility of a leak in the posterior urinary bladder in this  patient thought to have a fistula was again discussed with the  provider. Additionally, the presence of marked bilateral  hydronephrosis, worse on the RIGHT also was discussed. Perhaps a  cystogram prior to stented exchange if will not significantly delay  care could be helpful for further management as clinically  warranted. This would avoid the confounding effect of contrast which  could be introduced during stent exchange. Urologic consultation was  suggested.    These results were called by telephone at the time of interpretation  on 12/24/2019 at 12:34 pm to provider [name] , who verbally  acknowledged these results.      Electronically Signed    By: Zetta Bills M.D.    On: 12/24/2019 12:35      Final      Scheduled Meds: . sodium chloride   Intravenous Once  . bisacodyl  10 mg Rectal Daily  . Chlorhexidine Gluconate Cloth  6 each Topical Daily  . feeding supplement  1 Container Oral TID BM  . fluconazole  200 mg Oral Daily  . gabapentin  200 mg Oral Daily  . multivitamin with minerals  1 tablet Oral Daily  . nicotine  14 mg Transdermal Daily  . sevelamer carbonate  800 mg Oral TID WC  . sodium bicarbonate  1,300 mg Oral BID  . sodium chloride flush  5 mL Intracatheter Q8H  . sodium chloride flush  5 mL Intracatheter Q8H  . tamsulosin  0.4 mg Oral Daily   PRN Meds: acetaminophen,  HYDROcodone-acetaminophen, hydrOXYzine, ondansetron, polyethylene glycol Continuous Infusions: . lactated ringers 100 mL/hr at 01/03/20 2258  . meropenem (MERREM) IV 500 mg (01/04/20 1051)      LOS: 11 days  Time spent: Greater than 50% of the 35 minute visit was spent in counseling/coordination of care for the patient as laid out in the A&P.   Dwyane Dee, MD [facility] 01/04/2020, 1:48 PM  Contact via secure chat.  To contact the attending provider between 7A-7P or the covering provider during after hours 7P-7A, please log into the web site [email or web address] and access using universal [place] password for that web site. If you do not have the password, please call the hospital operator.

## 2020-01-04 NOTE — Progress Notes (Signed)
Writer notified by central telemetry that pt had 13 beat run SVT, then returned to sinus rhythm. Assessed pt. Pt Was sleeping and denies any complaints at this time. Provider made aware.

## 2020-01-04 NOTE — Progress Notes (Signed)
Patient ID: Jashun Puertas, male   DOB: Dec 28, 1965, 54 y.o.   MRN: [id number]  10 Days Post-Op Subjective: Pt feeling better.  S/P drain into pelvic abscess Friday.  No fever.  On meropenem and fluconazole per ID.  Objective: Vital signs in last 24 hours: Temp:  [98.6 F (37 C)-99.1 F (37.3 C)] 98.8 F (37.1 C) (10/05 0606) Pulse Rate:  [43-65] 43 (10/05 0606) Resp:  [18] 18 (10/05 0606) BP: (128-130)/(73-78) 128/78 (10/05 0606) SpO2:  [97 %-98 %] 98 % (10/05 0606)  Intake/Output from previous day: 10/04 0701 - 10/05 0700 In: 2985.1 [P.O.:820; I.V.:1935.1; IV Piggyback:200] Out: 4115 [Urine:4100; Drains:15] Intake/Output this shift: No intake/output data recorded.  Physical Exam:  General: Alert and oriented GU: B PCNs draining clear urine  Lab Results: Recent Labs    01/02/20 0427 01/03/20 0506 01/04/20 0453  HGB 7.6* 8.9* 8.8*  HCT 24.3* 27.8* 27.9*   BMET Recent Labs    01/03/20 0506 01/04/20 0453  NA 143 143  K 3.9 3.9  CL 112* 112*  CO2 22 23  GLUCOSE 81 77  BUN 53* 47*  CREATININE 6.42* 5.74*  CALCIUM 7.5* 7.4*     Studies/Results: No results found.  Assessment/Plan: 1) Colovesical fistula/pelvic abscess: Drain in place.  Plans for bowel diversion being discussed by General Surgery.  Timing of definitive colovesical fistula repair in the future per General Surgery and will be available for that procedure to repair bladder.  Will need IR to place antegrade right ureteral stent to help with intraoperative ureteral identification (can be done either during this admission or at some point in the future as an outpatient before definitive colovesical fistula repair).  Would still leave bilateral PCNs in place to optimize renal drainage in setting of recent renal failure. 2) AKI/CKD: Renal function improving.  Slow recovery suggests that while obstruction may have been contributory, not likely the primary cause.  Nephrology following.  Baseline Cr around  2.5. 3) Bladder outlet obstruction: Continue Foley catheter drainage.  Will continue to change Foley every 4-6 weeks and will address this more definitively after repair and recovery from colovesical fistula repair.   LOS: 11 days   [name] 01/04/2020, 8:12 AM

## 2020-01-04 NOTE — Progress Notes (Addendum)
Referring Physician(s): [name] Dr. Gloriann Loan  Supervising Physician: Jacqulynn Cadet  Patient Status:  [facility] - In-pt  Chief Complaint:  Bilateral hydronephrosis and a recurrent fluid collection s/p abscess drain placement to the recto vesicular recess and bilateral nephrostomy tubes on 9.27.21  Subjective: Patient reports and overall improvement in condition with some nausea remaining. Patient confirmed plans for surgery not posted at this time.   Allergies: Nsaids  Medications: Prior to Admission medications   Medication Sig Start Date End Date Taking? Authorizing Provider  cyanocobalamin (,VITAMIN B-12,) 1000 MCG/ML injection Inject 1,000 mcg into the muscle every 30 (thirty) days.  12/28/13  Yes [provider]  docusate sodium (COLACE) 100 MG capsule Take 1 capsule (100 mg total) by mouth 2 (two) times daily as needed for mild constipation. 11/19/19  Yes Antonieta Pert, MD  DULoxetine (CYMBALTA) 30 MG capsule Take 30-60 mg by mouth See admin instructions. Take 2 capsules in the morning and 1 capsule in the evening 08/12/17  Yes [provider]  gabapentin (NEURONTIN) 400 MG capsule Take 800 mg by mouth at bedtime.   Yes [provider]  HYDROcodone-acetaminophen (NORCO/VICODIN) 5-325 MG tablet Take 1-2 tablets by mouth every 4 (four) hours as needed for up to 10 doses for moderate pain or severe pain (1 moderate, 2 severe). 11/19/19  Yes Antonieta Pert, MD  hydrOXYzine (ATARAX/VISTARIL) 10 MG tablet Take 1 tablet (10 mg total) by mouth 3 (three) times daily as needed for itching or anxiety. 10/26/19  Yes Mikhail, Maryann, DO  midodrine (PROAMATINE) 2.5 MG tablet Take 2 tablets (5 mg total) by mouth 3 (three) times daily with meals. Patient taking differently: Take 2.5-5 mg by mouth 3 (three) times daily as needed (hypotension).  11/19/19  Yes Antonieta Pert, MD  Multiple Vitamin (MULTIVITAMIN WITH MINERALS) TABS tablet Take 1 tablet by mouth daily. 10/27/19  Yes Mikhail,  Velta Addison, DO  tamsulosin (FLOMAX) 0.4 MG CAPS capsule Take 0.4 mg by mouth daily.    Yes [provider]  feeding supplement, ENSURE ENLIVE, (ENSURE ENLIVE) LIQD Take 237 mLs by mouth 3 (three) times daily between meals. Patient not taking: Reported on 12/24/2019 10/26/19   Cristal Ford, DO     Vital Signs: BP 120/76 (BP Location: Right Arm)   Pulse 73   Temp 98.8 F (37.1 C) (Oral)   Resp 18   Ht 5\' 3"  (1.6 m)   Wt 136 lb 4.8 oz (61.8 kg)   SpO2 95%   BMI 24.14 kg/m   Physical Exam Vitals and nursing note reviewed.  Constitutional:      Appearance: He is well-developed.  HENT:     Head: Normocephalic.  Pulmonary:     Effort: Pulmonary effort is normal.  Abdominal:     Comments: Abscess drain to suction. 3 ml of serosanguinous fluid noted in JP drain. Dressing clean dry and intact.   Genitourinary:    Comments: Bilateral nephrostomy tubes with pale yellow fluid noted. 500 ml in left and 100 ml in right.  Musculoskeletal:        General: Normal range of motion.     Cervical back: Normal range of motion.  Skin:    General: Skin is dry.  Neurological:     Mental Status: He is alert and oriented to person, place, and time.     Imaging: No results found.  Labs:  CBC: Recent Labs    01/01/20 0430 01/02/20 0427 01/03/20 0506 01/04/20 0453  WBC 9.7 9.8 8.6 8.0  HGB 8.5* 7.6* 8.9* 8.8*  HCT 27.1* 24.3* 27.8* 27.9*  PLT 188 177 164 161    COAGS: Recent Labs    10/17/19 1241 10/17/19 1241 10/17/19 1311 10/17/19 1311 11/07/19 1836 11/07/19 1853 11/10/19 1052 12/08/19 1258 12/27/19 0450  INR 1.5*   < > 1.6*   < > 1.2  --  1.3* 1.1 1.3*  APTT 43*  --  46*  --   --  31  --   --   --    < > = values in this interval not displayed.    BMP: Recent Labs    01/01/20 0430 01/02/20 0427 01/03/20 0506 01/04/20 0453  NA 143 144 143 143  K 3.5 3.9 3.9 3.9  CL 116* 113* 112* 112*  CO2 18* 19* 22 23  GLUCOSE 73 84 81 77  BUN 60* 61* 53* 47*   CALCIUM 7.3* 7.5* 7.5* 7.4*  CREATININE 6.54* 6.27* 6.42* 5.74*  GFRNONAA 9* 9* 9* 10*  GFRAA 10* 11* 10* 12*    LIVER FUNCTION TESTS: Recent Labs    11/07/19 1836 11/07/19 1836 11/10/19 0932 11/10/19 0932 12/25/19 0217 12/25/19 0217 12/28/19 0503 12/30/19 0603 01/01/20 0430 01/02/20 0427 01/03/20 0506 01/04/20 0453  BILITOT 0.6  --  0.4  --  0.3  --  0.3  --   --   --   --   --   AST 36  --  17  --  44*  --  28  --   --   --   --   --   ALT 53*  --  41  --  76*  --  8  --   --   --   --   --   ALKPHOS 180*  --  111  --  122  --  87  --   --   --   --   --   PROT 7.5  --  5.6*  --  6.7  --  6.0*  --   --   --   --   --   ALBUMIN 2.7*   < > 1.8*   < > 2.7*   < > 2.2*   < > 2.0* 1.9* 1.9* 1.9*   < > = values in this interval not displayed.    Assessment and Plan: 54 y.o. male inpatient. History of Hodgkins lymphoma, CKD,  cystoscopy with cystogram, bladder outlet obstruction and bilateral hydronephrosis. Presented to the ED at [facility] with  hematuria dysuria and nausea. 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 IR placed a 12 Fr drain on 10.1.21 to the recto vesicular recess with aspiration of purulent fluid. IR placed bilateral nephrostomy tubes on 9.27.21 after right stent failure and inability to place left stent.  Cr 5.74, BUN 47. Abscess drain grew few yeast.  all other labs are within acceptable parameters. No pertinent imaging since drain placement. Per EPIC output is:  Left nephrostomy tube- 800 ml, 1825 ml  Right nephrostomy tube - 500 ml, 1175 ml  Drain - 10 ml, 15 ml  Recommend team continue  output recording q shift and dressing changes as needed. Plan is for drains to remain in place until colovesicular fistula repair is performed by urology and surgery. Right nephrostomy will need to  be internalized for surgical access at a  later time.    Electronically Signed: Jacqualine Mau, NP 01/04/2020, 3:29 PM   I spent a total of 15 Minutes at the patient's bedside AND on the patient's hospital floor or unit, greater than 50% of which was counseling/coordinating care for bilateral nephrostomy tubes and left back drain

## 2020-01-04 NOTE — Progress Notes (Addendum)
[facility] Progress Note  10 Days Post-Op  Subjective: Patient reports he is feeling well this AM. Concerned that weight recorded was incorrect at 144 lbs - I rechecked and got 133 lbs. Patient tolerating diet better and having bowel function. Wants to get back to rehab.  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 Wanted to make sure [name] is aware of it.   Objective: Vital signs in last 24 hours: Temp:  [98.6 F (37 C)-99.1 F (37.3 C)] 98.8 F (37.1 C) (10/05 0606) Pulse Rate:  [43-65] 43 (10/05 0606) Resp:  [18] 18 (10/05 0606) BP: (128-130)/(73-78) 128/78 (10/05 0606) SpO2:  [97 %-98 %] 98 % (10/05 0606) Last BM Date: 01/01/20  Intake/Output from previous day: 10/04 0701 - 10/05 0700 In: 2985.1 [P.O.:820; I.V.:1935.1; IV Piggyback:200] Out: 4115 [Urine:4100; Drains:15] Intake/Output this shift: No intake/output data recorded.  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 Psych: A&Ox3 with an appropriate affect.   Lab Results:  Recent Labs    01/03/20 0506 01/04/20 0453  WBC 8.6 8.0  HGB 8.9* 8.8*  HCT 27.8* 27.9*  PLT 164 161   BMET Recent Labs    01/03/20 0506 01/04/20 0453  NA 143 143  K 3.9 3.9  CL 112* 112*  CO2 22 23  GLUCOSE 81 77  BUN 53* 47*  CREATININE 6.42* 5.74*  CALCIUM 7.5* 7.4*   PT/INR No results for input(s): LABPROT, INR in the last 72 hours. CMP     Component Value Date/Time   NA 143 01/04/2020 0453   K 3.9 01/04/2020 0453   CL 112 (H) 01/04/2020 0453   CO2 23 01/04/2020 0453   GLUCOSE 77 01/04/2020 0453   BUN 47 (H) 01/04/2020 0453   CREATININE 5.74 (H) 01/04/2020 0453   CALCIUM 7.4 (L) 01/04/2020 0453   PROT 6.0  (L) 12/28/2019 0503   ALBUMIN 1.9 (L) 01/04/2020 0453   AST 28 12/28/2019 0503   ALT 8 12/28/2019 0503   ALKPHOS 87 12/28/2019 0503   BILITOT 0.3 12/28/2019 0503   GFRNONAA 10 (L) 01/04/2020 0453   GFRAA 12 (L) 01/04/2020 0453   Lipase  No results found for: LIPASE     Studies/Results: No results found.  Anti-infectives: Anti-infectives (From admission, onward)   Start     Dose/Rate Route Frequency Ordered Stop   01/01/20 1000  fluconazole (DIFLUCAN) tablet 200 mg        200 mg Oral Daily 12/31/19 1618 01/15/20 0959   12/31/19 1800  meropenem (MERREM) 500 mg in sodium chloride 0.9 % 100 mL IVPB        500 mg 200 mL/hr over 30 Minutes Intravenous Every 12 hours 12/31/19 1545 01/14/20 2159   12/27/19 1541  ciprofloxacin (CIPRO) 400 MG/200ML IVPB       Note to Pharmacy: Hilma Favors   : cabinet override      12/27/19 1541 12/28/19 0741   12/27/19 1333  fluconazole (DIFLUCAN) tablet 200 mg        200 mg Oral Daily 12/27/19 1333 12/31/19 0913   12/27/19 1300  fluconazole (DIFLUCAN) tablet 400 mg  Status:  Discontinued        400 mg Oral Daily 12/27/19 1213  12/27/19 1214   12/27/19 1300  fluconazole (DIFLUCAN) tablet 200 mg  Status:  Discontinued        200 mg Oral Daily 12/27/19 1214 12/27/19 1333   12/25/19 1600  piperacillin-tazobactam (ZOSYN) IVPB 2.25 g  Status:  Discontinued        2.25 g 100 mL/hr over 30 Minutes Intravenous Every 8 hours 12/25/19 1226 12/27/19 1213   12/25/19 1132  ceFAZolin (ANCEF) 2-4 GM/100ML-% IVPB       Note to Pharmacy: [place]  : cabinet override      12/25/19 1132 12/25/19 2344       Assessment/Plan Diverticular abscess and colovesical fistula  At some point, the patient would benefit from resection the rectosigmoid colon and bladder repair of the colovesical fistula.  The tentative plan was to wait for infection to go down, renal function to improve, and rehabilitation from his stroke.  With his readmission, surgery has been  set back.  However he already looks more bright and alert and hemiparesis seems to be markedly resolved since last months admission (despite the worsening ARF/CKD).  I would be inclined to keep him on antibiotics until surgery.  Continue fluconazole per infectious disease.  Meropenem for now.  Follow-up on cultures.  Drains for recurrent pelvic abscess.  Needs to stay until he gets surgery.  Therapy includes MiraLAX to help clean out his severe constipation as possible and also anticipation of colonoscopy or flexible sigmoidoscopy this hospitalization  Patient needs a colonoscopy at some point.  Even a flexible sigmoidoscopy would be helpful.  If there are no surprises on his CAT scan, consider gastroenterology consult for endoscopy and biopsies this admission since no evidence of perf.  Make sure this is not a malignancy also suspected on MRI of the pelvis.  May at outside institution.  Patient thinks he has had a colonoscopy in the past year through [facility] [place] in [place].  However he admits his memory is not great since the stroke. That could alter treatment plan.    If it is diverticular etiology as we assume, then hopefully would plan robotic sigmoid colectomy with repair of bladder for colovesical fistula.  Ideally would wait 6 weeks after resolution of any abscess.  Low threshold for him to have a Hartmann resection or diverting colostomy if he worsens / fails to improve this admission with another operation in the future.  Another possibility would be immediate anastomosis with diverting ileostomy.  That will have to be determined on the day of surgery.  Patient's biggest concern is losing his rehab/SNF spot at [facility] since he has enjoyed working with rehab through them & recovering from his stroke & ARF/CKD with severe deconditioning.  He feels like he is made a lot of strides with PT etc, and he is worried about losing his spot sponsored by the [place] contract due to this unexpected readmission.   Would be like going/colonoscopy hospitalization.  For case management and social work to help work with him - they are following closely.  I do agree that rehab is essential for minimal morbidity/mortality.  VTE prophylaxis- SCDs, etc  Mobilize as tolerated to help recovery  LOS: 11 days    Norm Parcel , [facility] 01/04/2020, 8:05 AM Please see Amion for pager number during day hours 7:00am-4:30pm

## 2020-01-05 LAB — AEROBIC/ANAEROBIC CULTURE W GRAM STAIN (SURGICAL/DEEP WOUND): Special Requests: NORMAL

## 2020-01-05 LAB — RENAL FUNCTION PANEL
Albumin: 2 g/dL — ABNORMAL LOW (ref 3.5–5.0)
Anion gap: 8 (ref 5–15)
BUN: 53 mg/dL — ABNORMAL HIGH (ref 6–20)
CO2: 26 mmol/L (ref 22–32)
Calcium: 7.5 mg/dL — ABNORMAL LOW (ref 8.9–10.3)
Chloride: 108 mmol/L (ref 98–111)
Creatinine, Ser: 5.94 mg/dL — ABNORMAL HIGH (ref 0.61–1.24)
GFR calc non Af Amer: 10 mL/min — ABNORMAL LOW (ref >60)
Glucose, Bld: 140 mg/dL — ABNORMAL HIGH (ref 70–99)
Phosphorus: 4.2 mg/dL (ref 2.5–4.6)
Potassium: 4.3 mmol/L (ref 3.5–5.1)
Sodium: 142 mmol/L (ref 135–145)

## 2020-01-05 LAB — CBC WITH DIFFERENTIAL/PLATELET
Abs Immature Granulocytes: 0.03 10*3/uL (ref 0.00–0.07)
Basophils Absolute: 0 10*3/uL (ref 0.0–0.1)
Basophils Relative: 1 %
Eosinophils Absolute: 0.4 10*3/uL (ref 0.0–0.5)
Eosinophils Relative: 8 %
HCT: 29.2 % — ABNORMAL LOW (ref 39.0–52.0)
Hemoglobin: 9 g/dL — ABNORMAL LOW (ref 13.0–17.0)
Immature Granulocytes: 1 %
Lymphocytes Relative: 21 %
Lymphs Abs: 1.1 10*3/uL (ref 0.7–4.0)
MCH: 30.4 pg (ref 26.0–34.0)
MCHC: 30.8 g/dL (ref 30.0–36.0)
MCV: 98.6 fL (ref 80.0–100.0)
Monocytes Absolute: 0.4 10*3/uL (ref 0.1–1.0)
Monocytes Relative: 8 %
Neutro Abs: 3.2 10*3/uL (ref 1.7–7.7)
Neutrophils Relative %: 61 %
Platelets: 143 10*3/uL — ABNORMAL LOW (ref 150–400)
RBC: 2.96 MIL/uL — ABNORMAL LOW (ref 4.22–5.81)
RDW: 16.7 % — ABNORMAL HIGH (ref 11.5–15.5)
WBC: 5.2 10*3/uL (ref 4.0–10.5)
nRBC: 0 % (ref 0.0–0.2)

## 2020-01-05 LAB — MAGNESIUM: Magnesium: 2.1 mg/dL (ref 1.7–2.4)

## 2020-01-05 NOTE — Progress Notes (Signed)
Referring Physician(s): Dr. Rolly Pancake   Supervising Physician: Arne Cleveland  Patient Status:  [facility] - In-pt  Chief Complaint: Bilateral hydronephrosis and recurrent colovesicular fistula with pelvic fluid collection. S/p bilateral percutaneous nephrostomy tubes 12/27/19 with Dr. Earleen Newport and transgluteal abscess drain 12/31/19 with Dr. Laurence Ferrari.   Subjective: Patient in bed watching TV. No apparent discomfort or distress. No complaints at this time other than some itchiness due to the tape around his drains.   Allergies: Nsaids  Medications: Prior to Admission medications   Medication Sig Start Date End Date Taking? Authorizing Provider  cyanocobalamin (,VITAMIN B-12,) 1000 MCG/ML injection Inject 1,000 mcg into the muscle every 30 (thirty) days.  12/28/13  Yes [provider]  docusate sodium (COLACE) 100 MG capsule Take 1 capsule (100 mg total) by mouth 2 (two) times daily as needed for mild constipation. 11/19/19  Yes Antonieta Pert, MD  DULoxetine (CYMBALTA) 30 MG capsule Take 30-60 mg by mouth See admin instructions. Take 2 capsules in the morning and 1 capsule in the evening 08/12/17  Yes [provider]  gabapentin (NEURONTIN) 400 MG capsule Take 800 mg by mouth at bedtime.   Yes [provider]  HYDROcodone-acetaminophen (NORCO/VICODIN) 5-325 MG tablet Take 1-2 tablets by mouth every 4 (four) hours as needed for up to 10 doses for moderate pain or severe pain (1 moderate, 2 severe). 11/19/19  Yes Antonieta Pert, MD  hydrOXYzine (ATARAX/VISTARIL) 10 MG tablet Take 1 tablet (10 mg total) by mouth 3 (three) times daily as needed for itching or anxiety. 10/26/19  Yes Mikhail, Maryann, DO  midodrine (PROAMATINE) 2.5 MG tablet Take 2 tablets (5 mg total) by mouth 3 (three) times daily with meals. Patient taking differently: Take 2.5-5 mg by mouth 3 (three) times daily as needed (hypotension).  11/19/19  Yes Antonieta Pert, MD  Multiple Vitamin (MULTIVITAMIN WITH MINERALS)  TABS tablet Take 1 tablet by mouth daily. 10/27/19  Yes Mikhail, Velta Addison, DO  tamsulosin (FLOMAX) 0.4 MG CAPS capsule Take 0.4 mg by mouth daily.    Yes [provider]  feeding supplement, ENSURE ENLIVE, (ENSURE ENLIVE) LIQD Take 237 mLs by mouth 3 (three) times daily between meals. Patient not taking: Reported on 12/24/2019 10/26/19   Cristal Ford, DO     Vital Signs: BP 129/67 (BP Location: Right Arm)   Pulse 65   Temp 98.3 F (36.8 C) (Oral)   Resp 20   Ht 5\' 3"  (1.6 m)   Wt 136 lb 4.8 oz (61.8 kg)   SpO2 99%   BMI 24.14 kg/m   Physical Exam Constitutional:      General: He is not in acute distress. Pulmonary:     Effort: Pulmonary effort is normal.  Abdominal:     Palpations: Abdomen is soft.     Comments: Bilateral PCNs, both with approximately 150 ml of clear yellow urine. Transgluteal drain to suction with approximately 20 cc serosanguineous fluid in bulb. Dressing around site is lose but otherwise clean and dry.   Skin:    General: Skin is warm and dry.  Neurological:     Mental Status: He is alert and oriented to person, place, and time.     Imaging: No results found.  Labs:  CBC: Recent Labs    01/02/20 0427 01/03/20 0506 01/04/20 0453 01/05/20 0558  WBC 9.8 8.6 8.0 5.2  HGB 7.6* 8.9* 8.8* 9.0*  HCT 24.3* 27.8* 27.9* 29.2*  PLT 177 164 161 143*    COAGS: Recent Labs  10/17/19 1241 10/17/19 1241 10/17/19 1311 10/17/19 1311 11/07/19 1836 11/07/19 1853 11/10/19 1052 12/08/19 1258 12/27/19 0450  INR 1.5*   < > 1.6*   < > 1.2  --  1.3* 1.1 1.3*  APTT 43*  --  46*  --   --  31  --   --   --    < > = values in this interval not displayed.    BMP: Recent Labs    01/01/20 0430 01/01/20 0430 01/02/20 0427 01/03/20 0506 01/04/20 0453 01/05/20 0558  NA 143   < > 144 143 143 142  K 3.5   < > 3.9 3.9 3.9 4.3  CL 116*   < > 113* 112* 112* 108  CO2 18*   < > 19* 22 23 26   GLUCOSE 73   < > 84 81 77 140*  BUN 60*   < > 61* 53*  47* 53*  CALCIUM 7.3*   < > 7.5* 7.5* 7.4* 7.5*  CREATININE 6.54*   < > 6.27* 6.42* 5.74* 5.94*  GFRNONAA 9*   < > 9* 9* 10* 10*  GFRAA 10*  --  11* 10* 12*  --    < > = values in this interval not displayed.    LIVER FUNCTION TESTS: Recent Labs    11/07/19 1836 11/07/19 1836 11/10/19 0932 11/10/19 0932 12/25/19 0217 12/25/19 0217 12/28/19 0503 12/30/19 0603 01/02/20 0427 01/03/20 0506 01/04/20 0453 01/05/20 0558  BILITOT 0.6  --  0.4  --  0.3  --  0.3  --   --   --   --   --   AST 36  --  17  --  44*  --  28  --   --   --   --   --   ALT 53*  --  41  --  76*  --  8  --   --   --   --   --   ALKPHOS 180*  --  111  --  122  --  87  --   --   --   --   --   PROT 7.5  --  5.6*  --  6.7  --  6.0*  --   --   --   --   --   ALBUMIN 2.7*   < > 1.8*   < > 2.7*   < > 2.2*   < > 1.9* 1.9* 1.9* 2.0*   < > = values in this interval not displayed.    Assessment and Plan:  Hydronephrosis, s/p bilateral nephrostomy tubes. Colovesicular fistula with abscess, s/p transgluteal drain placement: Epic output for PCNs: Left: 2100 cc. Right 925 cc JP drain: 25 cc. Continue recording outputs for all drains each shift. Flush JP drain every shift. Change dressings daily or as needed. Plan is for drains to remain in place until colovesicular fistula repair is performed by urology and surgery. Right nephrostomy will need to be internalized for surgical access at a later time.   Other plans per primary teams. IR will continue to follow.   Electronically Signed: Soyla Dryer, AGACNP-BC [phone] 01/05/2020, 2:29 PM   I spent a total of 15 Minutes at the the patient's bedside AND on the patient's hospital floor or unit, greater than 50% of which was counseling/coordinating care for bilateral PCNs and transgluteal abscess drain.

## 2020-01-05 NOTE — Progress Notes (Signed)
Patient ID: Chase [name], male   DOB: [birth year], 54 y.o.   MRN: [id number]   11 Days Post-Op Subjective: Pt with slightly worsening renal function today.  Objective: Vital signs in last 24 hours: Temp:  [98.8 F (37.1 C)-99.1 F (37.3 C)] 99.1 F (37.3 C) (10/06 0507) Pulse Rate:  [51-73] 51 (10/06 0507) Resp:  [15-18] 15 (10/06 0507) BP: (120-135)/(72-76) 135/72 (10/06 0507) SpO2:  [95 %-97 %] 97 % (10/06 0507)  Intake/Output from previous day: 10/05 0701 - 10/06 0700 In: 2836.6 [P.O.:1080; I.V.:1656.6; IV Piggyback:100] Out: 3901 [Urine:3875; Drains:25; Stool:1] Intake/Output this shift: No intake/output data recorded.   Lab Results: Recent Labs    01/03/20 0506 01/04/20 0453 01/05/20 0558  HGB 8.9* 8.8* 9.0*  HCT 27.8* 27.9* 29.2*   BMET Recent Labs    01/04/20 0453 01/05/20 0558  NA 143 142  K 3.9 4.3  CL 112* 108  CO2 23 26  GLUCOSE 77 140*  BUN 47* 53*  CREATININE 5.74* 5.94*  CALCIUM 7.4* 7.5*     Studies/Results: No results found.  Assessment/Plan: 1) Colovesical fistula/pelvic abscess: Drain in place.  Timing of definitive colovesical fistula repair in the future per General Surgery and will be available for that procedure to repair bladder. It appears this will be done after he recovers further from his recent hospitalization.  Will need IR to place antegrade right ureteral stent to help with intraoperative ureteral identification (can be done either during this admission or at some point in the future as an outpatient before definitive colovesical fistula repair - I assume this will be scheduled by IR).  Would still leave bilateral PCNs in place to optimize renal drainage in setting of recent renal failure. 2) AKI/CKD: Renal function no longer improving. Do not suspect obstruction is likely the primary cause.  Nephrology following.  Baseline Cr around 2.5. 3) Bladder outlet obstruction: Continue Foley catheter drainage.  Will continue to change  Foley every 4-6 weeks and will address this more definitively after repair and recovery from colovesical fistula repair.  He has f/u in our office in about 1-2 weeks for this but will consider changing this if he remains hospitalized through next week.     Will continue to follow periodically but please call if more urgent urologic questions in the meantime.    LOS: 12 days   [name] 01/05/2020, 8:04 AM

## 2020-01-05 NOTE — Progress Notes (Signed)
PROGRESS NOTE    Chase [name]  OVZ:[id number] DOB: [birth year] DOA: 12/24/2019 PCP: [name]   Chief Complaint  Patient presents with  . Hematuria    Brief Narrative:  Chase [name] is Chase [name] 54 year old male with PMH of s/p cystoscopy with cystogram, bilateral retrograde pyelogram, likely colovesical fistula, left ureteral stent 11/15/2019, right ureteral stent was unable to be placed due to ureteral tortuosity, underwent right PC nephrostomy tube followed by internalization of right-sided stent, indwelling Foley catheter, CKD, GI bleed, chronic pain, gastric bypass, presented from SNF due to hematuria, dysuria, nausea, poor appetite and fatigue.   CT abdomen on admission showed severe bilateral hydronephrosis and possible colovesical fistula. On admission creatinine 7.1 (baseline 2.4-2.8).  Admitted for acute on chronic kidney disease, severe anion gap metabolic acidosis, hyperkalemia, bilateral hydronephrosis. Nephrology and urology consulting. 9/25, s/p left ureteral stent replaced but right ureteral stent unsuccessful. Bilateral percutaneous nephrostomies placed by IR 12/27/19.  His creatinine peaked at 8.47 and started to show some signs of downtrending. Urine output remained adequate consistent with ATN/diuretic phase. He underwent repeat CT abdomen/pelvis on 12/30/2019 which revealed "Perivesical fluid collection is again noted, which currently measures 6.0 x 4.3 cm in the transverse orientation which may be slightly enlarged compared to prior exam".  IR was consulted for possible drain placement into fluid collection.  He underwent successful drain placement on 12/31/2019 and cultures were sent for testing. Infectious disease was also consulted and patient was started on meropenem and continued with fluconazole while cultures further matured. He was also evaluated by GI and tentative plans are for colonoscopy the day prior to fistula repair if able to be coordinated in  that manner.  Assessment & Plan:   Principal Problem:   Acute renal failure superimposed on stage 4 chronic kidney disease (HCC) Active Problems:   CKD (chronic kidney disease), stage IV (HCC)   Chronic pain   Cerebral embolism with cerebral infarction   Protein-calorie malnutrition, severe   BPH (benign prostatic hyperplasia)   Colovesical fistula   Pelvic abscess with colovescial fistula   Anemia   History of COVID-19   History of gastric bypass   Iron deficiency anemia   Pernicious anemia   PTSD (post-traumatic stress disorder)   AKI (acute kidney injury) ([facility])   Bilateral ureteral obstruction s/p perc nephrosotmy & stenting   ATN (acute tubular necrosis) (HCC)   Candida UTI   Electrolyte abnormality   Increased anion gap metabolic acidosis   Microcytic anemia   ABLA (acute blood loss anemia)   Anxiety and depression   History of CVA (cerebrovascular accident)   BMI 24.0-24.9, adult   Chronic narcotic use   Anemia in chronic kidney disease   Incisional hernia - epigastric  Acute renal failure superimposed on stage 4 chronic kidney disease (HCC) Nephrologyfollowing, appreciate assistance. Baseline creatinine 2.4-2.8. Presented with creatinine of 7.1 and peaked at 8.47. CT abdomen showed bilateral severe hydronephrosis. Foley catheter in place. - s/p left ureteral stent replacement on 9/25 (right ureteral stent unsuccessful) - per urology, would recommend IR to internalize Chase [name] ureteral stent at some point in preparation for repair of colovesical fistula to identify ureters intraoperatively - s/p bilateral perc nephrostomy tubes by IR 9/27 - tubes to remain in place until colovesicular fistula repair completed -Appears to be in his post obstructive diuretic phase -renal following, appreciate assistance -appreciate urology recommendations - renal function relatively stable today, creatinine 5.94, 3.8 L out yesterday, continue IVF  Pelvic abscess with colovescial  fistula - greatly appreciate multi-modality input (surgery, nephrology, urology, IR, ID, GI) - definitive repair still being determined; per surgery note 10/5, plan to return to rehab when stable, will need follow up with [name] for surgical planning (will need to clarify with surgery, note also mentioned staying until surgery) - s/p IR pelvic abscess drain on 10/1 (culture growing "few yeast"); ID consulted; patient now on meropenem and fluconazole (C. Albicans growing in L/[name] nephrostomy tube cultures and as noted now the deep pelvic culture is growing few yeast from 10/1).  Plan for 14 days fluconazole + meropenem.  (per surgery, recommending continue abx until surgery, will need to arrange f/u with surgery) - if he does spike fever or significant leukocytosis, needs blood cultures - evaluated by GI on 10/2; tentative plan is for CLN the day prior to fistula repair; timing of this is still to be determined  - need to clarify with surgery if plan is inpatient repair vs awaiting further renal recovery then outpatient repair; patient also asking if his hernia could be repaired at the same time  Bilateral ureteral obstruction s/p perc nephrosotmy & stenting -Bilateral nephrostomy tubes to remain in place per urology until definitive repair of colovesical fistula -Also recommendations per urology are for IR to internalize right ureteral stent in preparation for fistula repair in order to identify ureters intraoperatively -Indwelling Foley catheter to remain in place with catheter exchange every 4 to 6 weeks until definitive surgery for fistula and bladder repair  History of CVA (cerebrovascular accident) -CVA on 10/18/2019 MRI. Small bilateral acute cerebral and cerebellar infarcts. Residual left hemiparesis  Electrolyte abnormality Hyperkalemia, hypokalemia, hypocalcemia, hyperphosphatemia -Treating appropriately as indicated per each - iPTH = 50 - continue sevelamer - IVF changed to LR per  nephrology   Candida UTI - recent urological interventions, dysuria although he is afebrile and without leukocytosis. Previous urine cultures had shown E. coli and Pseudomonas. Patient was started on IV Zosyn. However urine culture then showed 70 K colonies of yeast, speciated to C. Albicans - remains on fluconazole; ID now formally consulted, continue to follow recommendations - see ID note from 10/4; plan is to complete 14 day course of Fluconazole and meropenem; end date 01/14/20; if gets discharged prior to course completion needs IR to place CVL (cannot have PICC due to renal status)  BMI 24.0-24.9, adult - continue supplements and MVI  Anxiety and depression - resume cymbalta if renal function returns near baseline  ABLA (acute blood loss anemia) - see microcytic anemia  Microcytic anemia - has required 2 units PRBC so far; Hgb still slowly downtrending - continue to monitor and will transfuse further as needed  Increased anion gap metabolic acidosis Bicarbonate drip discontinued and started oral bicarbonate 9/27. - also on LR  ATN (acute tubular necrosis) ([facility]) - see AoCKDIV  AKI (acute kidney injury) ([facility]) - see AoCKDIV   DVT prophylaxis: SCD Code Status: full  Family Communication: none at bedside Disposition:   Status is: Inpatient  Remains inpatient appropriate because:Inpatient level of care appropriate due to severity of illness   Dispo: The patient is from: Home              Anticipated d/c is to: SNF              Anticipated d/c date is: > 3 days              Patient currently is not medically stable to d/c.  Consultants:   Urology, Nephrology, GI, IR, ID, surgery  Procedures:  See previous notes and above  Antimicrobials:  Anti-infectives (From admission, onward)   Start     Dose/Rate Route Frequency Ordered Stop   01/01/20 1000  fluconazole (DIFLUCAN) tablet 200 mg        200 mg Oral Daily 12/31/19 1618 01/15/20 0959    12/31/19 1800  meropenem (MERREM) 500 mg in sodium chloride 0.9 % 100 mL IVPB        500 mg 200 mL/hr over 30 Minutes Intravenous Every 12 hours 12/31/19 1545 01/14/20 2159   12/27/19 1541  ciprofloxacin (CIPRO) 400 MG/200ML IVPB       Note to Pharmacy: Hilma Favors   : cabinet override      12/27/19 1541 12/28/19 0741   12/27/19 1333  fluconazole (DIFLUCAN) tablet 200 mg        200 mg Oral Daily 12/27/19 1333 12/31/19 0913   12/27/19 1300  fluconazole (DIFLUCAN) tablet 400 mg  Status:  Discontinued        400 mg Oral Daily 12/27/19 1213 12/27/19 1214   12/27/19 1300  fluconazole (DIFLUCAN) tablet 200 mg  Status:  Discontinued        200 mg Oral Daily 12/27/19 1214 12/27/19 1333   12/25/19 1600  piperacillin-tazobactam (ZOSYN) IVPB 2.25 g  Status:  Discontinued        2.25 g 100 mL/hr over 30 Minutes Intravenous Every 8 hours 12/25/19 1226 12/27/19 1213   12/25/19 1132  ceFAZolin (ANCEF) 2-4 GM/100ML-% IVPB       Note to Pharmacy: [place]  : cabinet override      12/25/19 1132 12/25/19 2344     Subjective: Eager to d/c when able safely  Objective: Vitals:   01/04/20 1345 01/04/20 2118 01/05/20 0507 01/05/20 1406  BP: 120/76 127/72 135/72 129/67  Pulse: 73 68 (!) 51 65  Resp: 18 16 15 20   Temp: 98.8 F (37.1 C) 98.8 F (37.1 C) 99.1 F (37.3 C) 98.3 F (36.8 C)  TempSrc: Oral Oral Oral Oral  SpO2: 95% 96% 97% 99%  Weight:      Height:        Intake/Output Summary (Last 24 hours) at 01/05/2020 1644 Last data filed at 01/05/2020 1339 Gross per 24 hour  Intake 2476.61 ml  Output 3405 ml  Net -928.39 ml   Filed Weights   12/28/19 0500 12/30/19 0456 12/31/19 0500  Weight: 56.1 kg 59.9 kg 61.8 kg    Examination:  General exam: Appears calm and comfortable  Respiratory system: Clear to auscultation. Respiratory effort normal. Cardiovascular system: S1 & S2 heard, RRR. Gastrointestinal system: Abdomen is nondistended, soft and nontender.bilateral nephrostomy  tubes.  Drain. Central nervous system: Alert and oriented. No focal neurological deficits. Extremities: no LEE Skin: No rashes, lesions or ulcers Psychiatry: Judgement and insight appear normal. Mood & affect appropriate.     Data Reviewed: I have personally reviewed following labs and imaging studies  CBC: Recent Labs  Lab 01/01/20 0430 01/02/20 0427 01/03/20 0506 01/04/20 0453 01/05/20 0558  WBC 9.7 9.8 8.6 8.0 5.2  NEUTROABS 6.9 6.9 5.8 5.8 3.2  HGB 8.5* 7.6* 8.9* 8.8* 9.0*  HCT 27.1* 24.3* 27.8* 27.9* 29.2*  MCV 95.8 96.4 95.5 96.2 98.6  PLT 188 177 164 161 143*    Basic Metabolic Panel: Recent Labs  Lab 01/01/20 0430 01/02/20 0427 01/03/20 0506 01/04/20 0453 01/05/20 0558  NA 143 144 143 143 142  K 3.5 3.9  3.9 3.9 4.3  CL 116* 113* 112* 112* 108  CO2 18* 19* 22 23 26   GLUCOSE 73 84 81 77 140*  BUN 60* 61* 53* 47* 53*  CREATININE 6.54* 6.27* 6.42* 5.74* 5.94*  CALCIUM 7.3* 7.5* 7.5* 7.4* 7.5*  MG 2.2 2.1 2.0 2.0 2.1  PHOS 6.2* 5.9* 5.6* 4.4 4.2    GFR: Estimated Creatinine Clearance: 11.2 mL/min (Cliford Sequeira) (by C-G formula based on SCr of 5.94 mg/dL (H)).  Liver Function Tests: Recent Labs  Lab 01/01/20 0430 01/02/20 0427 01/03/20 0506 01/04/20 0453 01/05/20 0558  ALBUMIN 2.0* 1.9* 1.9* 1.9* 2.0*    CBG: No results for input(s): GLUCAP in the last 168 hours.   Recent Results (from the past 240 hour(s))  Aerobic/Anaerobic Culture (surgical/deep wound)     Status: None   Collection Time: 12/27/19  4:26 PM   Specimen: Abscess  Result Value Ref Range Status   Specimen Description   Final    ABSCESS LEFT KIDNEY Performed at [facility] [address], [place], [place] [address]    Special Requests   Final    NONE Performed at [facility], [place] [address], [place], [place] [address]    Gram Stain   Final    MODERATE WBC PRESENT, PREDOMINANTLY MONONUCLEAR FEW YEAST    Culture   Final    FEW CANDIDA  ALBICANS NO ANAEROBES ISOLATED Performed at [facility], [place] [address] [place], [place] [address]    Report Status 01/01/2020 FINAL  Final  Aerobic/Anaerobic Culture (surgical/deep wound)     Status: None   Collection Time: 12/27/19  4:36 PM   Specimen: Abscess  Result Value Ref Range Status   Specimen Description   Final    ABSCESS RIGHT KIDNEY Performed at [facility] [address], [place], [place] [address]    Special Requests   Final    NONE Performed at [facility], [place] [address], [place], [place] [address]    Gram Stain   Final    RARE WBC PRESENT,BOTH PMN AND MONONUCLEAR NO ORGANISMS SEEN    Culture   Final    FEW CANDIDA ALBICANS NO ANAEROBES ISOLATED Performed at [facility], [place] [address] [place], [place] [address]    Report Status 01/02/2020 FINAL  Final  Aerobic/Anaerobic Culture (surgical/deep wound)     Status: None   Collection Time: 12/31/19  1:06 PM   Specimen: Abscess  Result Value Ref Range Status   Specimen Description   Final    ABSCESS Performed at [facility] [address] [place], [place] [address]    Special Requests   Final    Normal Performed at [facility], [place] [address], [place], [place] [address]    Gram Stain   Final    MANY WBC PRESENT, PREDOMINANTLY PMN NO ORGANISMS SEEN    Culture   Final    FEW CANDIDA ALBICANS NO ANAEROBES ISOLATED Performed at [facility], [place] [address], [place],  [address]    Report Status 01/05/2020 FINAL  Final         Radiology Studies: No results found.      Scheduled Meds: . sodium chloride   Intravenous Once  . bisacodyl  10 mg Rectal Daily  . Chlorhexidine Gluconate Cloth  6 each Topical Daily  . feeding supplement  1 Container Oral TID BM  . fluconazole  200 mg Oral Daily  .  gabapentin  200 mg Oral Daily  . multivitamin with minerals  1 tablet Oral Daily  . nicotine   14 mg Transdermal Daily  . sevelamer carbonate  800 mg Oral TID WC  . sodium bicarbonate  1,300 mg Oral BID  . sodium chloride flush  5 mL Intracatheter Q8H  . sodium chloride flush  5 mL Intracatheter Q8H  . tamsulosin  0.4 mg Oral Daily   Continuous Infusions: . lactated ringers 100 mL/hr at 01/05/20 1059  . meropenem (MERREM) IV 500 mg (01/05/20 1059)     LOS: 12 days    Time spent: over 57 min    Fayrene Helper, MD [facility]   To contact the attending provider between 7A-7P or the covering provider during after hours 7P-7A, please log into the web site [email or web address] and access using universal [place] password for that web site. If you do not have the password, please call the hospital operator.  01/05/2020, 4:44 PM

## 2020-01-05 NOTE — Progress Notes (Signed)
[facility] Progress Note  Subjective: creat up a bit today, 5.8, good UOP, no c/o's from the patinet, no n/v or jerking of ext  Review of systems:     Denies shortness of breath  No chest pain  No n/v  Vitals:   01/04/20 1345 01/04/20 2118 01/05/20 0507 01/05/20 1406  BP: 120/76 127/72 135/72 129/67  Pulse: 73 68 (!) 51 65  Resp: 18 16 15 20   Temp: 98.8 F (37.1 C) 98.8 F (37.1 C) 99.1 F (37.3 C) 98.3 F (36.8 C)  TempSrc: Oral Oral Oral Oral  SpO2: 95% 96% 97% 99%  Weight:      Height:        Exam: Gen adult male in bed in NAD; chronically ill appearing    HENT NCAT  Chest clear bilaterally unlabored on room air S1S2 no rub Abd soft ntnd , pelvic drain in place GU foley cath in place; neph tubes in place bilat Ext no lower extremity edema appreciated Neuro is alert, Ox 3; provides hx and follows commands Psych normal mood and affect    Home meds:  - cymbalta 30-60 hs/ neurontin 800 hs/ norco qid prn  - midodrine 2.5- 5 tid prn  - flomax 0.4 qd  - ensure tid bm/ MVI/ atarax prn      CT renal stone study noncon >  IMPRESSION: 1. Reaccumulation of small to moderate volume of fluid above the urinary bladder in this patient with suspected defect in the urinary bladder. No adjacent colonic thickening. Study limited by lack of rectal or vesicle contrast with respect to detection of colovesical fistula though the fat plane appears reasonably well preserved between the collection in the colon on the current study. 2. Interval development of moderate to marked bilateral hydroureteronephrosis despite presence of bilateral ureteral stents. 3. Aortic atherosclerosis.    VS BP 114/ 66, HR 70  RR 18  RA 100%  Temp 98   Na 131 K 5.5  CO2 7  BUN 81  Cr 7.19  Ca 8.0  Hb 8.9  WBC 7k  plt 220   COVID negative   UA many bact >50 rbc/ wbc, 100 prot, turbid     Assessment/ Plan 1. AoCKD 4 - b/l creat 2.4- 2.8, eGFR 24- 22m/min. AKI presumably due to  obstruction w/ new and severe bilat hydronephrosis by imaging.  Also with hypotension/ischemic and pre-renal insults. Urology did procedure 9/25 w/ L ureteral stent replaced but could not get a new R ureteral stent in position. tortuous ureters. Had bilateral neph tubes with IR on 9/27.  Creat improved from peak 8.4 >> 7.7 > 6.9 > 6.5 > 6.2 > 6.4 > 5.7 > 5.94 today.  Very good UOP. Getting IVF at 100cc/hr. No uremic findings. Has sig CKD and relief of obstruction has improved somewhat but creat may be leveling off. Will repeat in am.  He wants to dc back to SNF. As long as labs are stable or declining and pt not uremic should be ok for dc soon.  2. Bradycardia - improving; no longer hyperkalemic and not on nodal blocking agents   3. Met acidosis - improved; on oral bicarb and LR 4. Hypokalemia - mag is replete; Ca corrects with adjustment for albumin; on LR 5. Hematuria/ bilat hydronephrosis/ hx ureteral stents - tortuous ureters per the OP note by urology. Urology recommends IR to internalize a right ureteral stent at some point in preparation for repair of colovesical fistula to identify ureters intraoperatively.  6. Pelvic abscess between bladder and colon - Had IR drain placement to pelvic abscess on 10/1. abx per primary   7. H/o CVA - left hemiparesis, had been at a SNF  8. Hypocalcemia - s/p repletion IV.  Improves with corr for albumin. intact PTH ok at 50 9. hyperphos - renvela with meals for now. Advanced from renal to regular diet; he's on nepro as well as ensure/enlive 10. Normocytic anemia - iron deficient. Repleted with feraheme on 9/29.  S/p aranesp 40 mcg on 9/30 (one-time-order).  Transfuse PRBC's on 10/3 1 unit    Kelly Splinter, MD 01/05/2020, 5:23 PM     Recent Labs  Lab 01/04/20 0453 01/05/20 0558  K 3.9 4.3  BUN 47* 53*  CREATININE 5.74* 5.94*  CALCIUM 7.4* 7.5*  PHOS 4.4 4.2  HGB 8.8* 9.0*   Inpatient medications: . sodium chloride   Intravenous Once  . bisacodyl  10  mg Rectal Daily  . Chlorhexidine Gluconate Cloth  6 each Topical Daily  . feeding supplement  1 Container Oral TID BM  . fluconazole  200 mg Oral Daily  . gabapentin  200 mg Oral Daily  . multivitamin with minerals  1 tablet Oral Daily  . nicotine  14 mg Transdermal Daily  . sevelamer carbonate  800 mg Oral TID WC  . sodium bicarbonate  1,300 mg Oral BID  . sodium chloride flush  5 mL Intracatheter Q8H  . sodium chloride flush  5 mL Intracatheter Q8H  . tamsulosin  0.4 mg Oral Daily   . lactated ringers 100 mL/hr at 01/05/20 1059  . meropenem (MERREM) IV 500 mg (01/05/20 1059)   acetaminophen, HYDROcodone-acetaminophen, hydrOXYzine, ondansetron, polyethylene glycol

## 2020-01-06 LAB — CBC WITH DIFFERENTIAL/PLATELET
Abs Immature Granulocytes: 0.04 10*3/uL (ref 0.00–0.07)
Basophils Absolute: 0 10*3/uL (ref 0.0–0.1)
Basophils Relative: 1 %
Eosinophils Absolute: 0.5 10*3/uL (ref 0.0–0.5)
Eosinophils Relative: 9 %
HCT: 27.8 % — ABNORMAL LOW (ref 39.0–52.0)
Hemoglobin: 8.4 g/dL — ABNORMAL LOW (ref 13.0–17.0)
Immature Granulocytes: 1 %
Lymphocytes Relative: 31 %
Lymphs Abs: 1.7 10*3/uL (ref 0.7–4.0)
MCH: 30.1 pg (ref 26.0–34.0)
MCHC: 30.2 g/dL (ref 30.0–36.0)
MCV: 99.6 fL (ref 80.0–100.0)
Monocytes Absolute: 0.5 10*3/uL (ref 0.1–1.0)
Monocytes Relative: 10 %
Neutro Abs: 2.7 10*3/uL (ref 1.7–7.7)
Neutrophils Relative %: 48 %
Platelets: 143 10*3/uL — ABNORMAL LOW (ref 150–400)
RBC: 2.79 MIL/uL — ABNORMAL LOW (ref 4.22–5.81)
RDW: 16.5 % — ABNORMAL HIGH (ref 11.5–15.5)
WBC: 5.5 10*3/uL (ref 4.0–10.5)
nRBC: 0 % (ref 0.0–0.2)

## 2020-01-06 LAB — RENAL FUNCTION PANEL
Albumin: 1.9 g/dL — ABNORMAL LOW (ref 3.5–5.0)
Anion gap: 7 (ref 5–15)
BUN: 52 mg/dL — ABNORMAL HIGH (ref 6–20)
CO2: 24 mmol/L (ref 22–32)
Calcium: 7.5 mg/dL — ABNORMAL LOW (ref 8.9–10.3)
Chloride: 110 mmol/L (ref 98–111)
Creatinine, Ser: 6.19 mg/dL — ABNORMAL HIGH (ref 0.61–1.24)
GFR calc non Af Amer: 9 mL/min — ABNORMAL LOW (ref >60)
Glucose, Bld: 83 mg/dL (ref 70–99)
Phosphorus: 4.6 mg/dL (ref 2.5–4.6)
Potassium: 5.1 mmol/L (ref 3.5–5.1)
Sodium: 141 mmol/L (ref 135–145)

## 2020-01-06 LAB — MAGNESIUM: Magnesium: 2.2 mg/dL (ref 1.7–2.4)

## 2020-01-06 MED ORDER — SODIUM CHLORIDE 0.45 % IV SOLN: INTRAVENOUS | Status: DC

## 2020-01-06 NOTE — Progress Notes (Signed)
[facility] Progress Note  Subjective: creat up 6.1, UOP good, I/O's matched, wt's up but not sure accurate  Review of systems:     Denies shortness of breath  No chest pain  No n/v  Vitals:   01/06/20 0500 01/06/20 0602 01/06/20 1236 01/06/20 1239  BP:  132/78 (!) 160/85 (!) 152/76  Pulse:  (!) 56 (!) 53 (!) 51  Resp:   15   Temp:  98.8 F (37.1 C) 98.7 F (37.1 C)   TempSrc:  Oral Oral   SpO2:  98% 100% 100%  Weight: 65.5 kg     Height:        Exam: Gen adult male in bed in NAD; chronically ill appearing    HENT NCAT  Chest clear bilaterally unlabored on room air S1S2 no rub Abd soft ntnd , pelvic drain in place GU foley cath in place; neph tubes in place bilat Ext no lower extremity edema appreciated Neuro is alert, Ox 3; provides hx and follows commands Psych normal mood and affect    Home meds:  - cymbalta 30-60 hs/ neurontin 800 hs/ norco qid prn  - midodrine 2.5- 5 tid prn  - flomax 0.4 qd  - ensure tid bm/ MVI/ atarax prn      CT renal stone study noncon >  IMPRESSION: 1. Reaccumulation of small to moderate volume of fluid above the urinary bladder in this patient with suspected defect in the urinary bladder. No adjacent colonic thickening. Study limited by lack of rectal or vesicle contrast with respect to detection of colovesical fistula though the fat plane appears reasonably well preserved between the collection in the colon on the current study. 2. Interval development of moderate to marked bilateral hydroureteronephrosis despite presence of bilateral ureteral stents. 3. Aortic atherosclerosis.    VS BP 114/ 66, HR 70  RR 18  RA 100%  Temp 98   Na 131 K 5.5  CO2 7  BUN 81  Cr 7.19  Ca 8.0  Hb 8.9  WBC 7k  plt 220   COVID negative   UA many bact >50 rbc/ wbc, 100 prot, turbid     Assessment/ Plan 1. AoCKD 4 - b/l creat 2.4- 2.8, eGFR 24- 49m/min. AKI presumably due to obstruction w/ new and severe bilat hydronephrosis by imaging.   Also with hypotension/ischemic and pre-renal insults. Urology did procedure 9/25 w/ L ureteral stent replaced but could not get a new R ureteral stent in position. tortuous ureters. Had bilateral neph tubes with IR on 9/27.  Creat improved from peak 8.4 >> 7.7 > 6.9 > 6.5 > 6.2 > 6.4 > 5.7 > 5.94 > 6.1 today.  Good UOP. Should not be dealing w/ post obstructive diuresis at this patient , will taper down IVF"s. Get standing wt. Remains non-uremic. Bilat PCN has helped some but more likely that pt CKD has worsened.  Not ready for dc yet.  2. Bradycardia - resolved, blocking agent dc'd and high K+ resolved 3. Met acidosis - improved; on oral bicarb and LR 4. Hypokalemia - mag is replete; Ca corrects with adjustment for albumin; on LR 5. Hematuria/ bilat hydronephrosis/ hx ureteral stents - tortuous ureters per the OP note by urology. Urology recommends IR to internalize a right ureteral stent at some point in preparation for repair of colovesical fistula to identify ureters intraoperatively.  6. Pelvic abscess between bladder and colon - Had IR drain placement to pelvic abscess on 10/1. abx per primary  7. H/o CVA - left hemiparesis, had been at a SNF  8. Hypocalcemia - s/p repletion IV.  Improves with corr for albumin. intact PTH ok at 50 9. hyperphos - renvela with meals for now. Advanced from renal to regular diet; he's on nepro as well as ensure/enlive 10. Normocytic anemia - iron deficient. Repleted with feraheme on 9/29.  S/p aranesp 40 mcg on 9/30 (one-time-order).  Transfuse PRBC's on 10/3 1 unit    Kelly Splinter, MD 01/06/2020, 3:41 PM     Recent Labs  Lab 01/05/20 0558 01/06/20 0507  K 4.3 5.1  BUN 53* 52*  CREATININE 5.94* 6.19*  CALCIUM 7.5* 7.5*  PHOS 4.2 4.6  HGB 9.0* 8.4*   Inpatient medications:  sodium chloride   Intravenous Once   bisacodyl  10 mg Rectal Daily   Chlorhexidine Gluconate Cloth  6 each Topical Daily   feeding supplement  1 Container Oral TID BM    fluconazole  200 mg Oral Daily   gabapentin  200 mg Oral Daily   multivitamin with minerals  1 tablet Oral Daily   nicotine  14 mg Transdermal Daily   sevelamer carbonate  800 mg Oral TID WC   sodium bicarbonate  1,300 mg Oral BID   sodium chloride flush  5 mL Intracatheter Q8H   sodium chloride flush  5 mL Intracatheter Q8H   tamsulosin  0.4 mg Oral Daily    lactated ringers 125 mL/hr at 01/06/20 0929   meropenem (MERREM) IV 500 mg (01/06/20 1004)   acetaminophen, HYDROcodone-acetaminophen, hydrOXYzine, ondansetron, polyethylene glycol

## 2020-01-06 NOTE — TOC Progression Note (Signed)
Transition of Care [facility]) - Progression Note    Patient Details  Name: Chase [name] MRN: [id number] Date of Birth: 1965-08-23  Transition of Care [facility]) CM/SW Contact  Purcell Mouton, RN Phone Number: 01/06/2020, 4:18 PM  Clinical Narrative:     Faxed clinicals to the [place].   Expected Discharge Plan: [place] Barriers to Discharge: Continued Medical Work up  Expected Discharge Plan and Services Expected Discharge Plan: [place] In-house Referral: Clinical Social Work     Living arrangements for the past 2 months: [place] Expected Discharge Date:  (unknown)                                     Social Determinants of Health (SDOH) Interventions    Readmission Risk Interventions Readmission Risk Prevention Plan 12/25/2019 11/19/2019  Transportation Screening Complete Complete  Medication Review Press photographer) Complete Complete  PCP or Specialist appointment within 3-5 days of discharge - Complete  HRI or [place] - Complete  SW Recovery Care/Counseling Consult - Complete  [place] - Complete  Some recent data might be hidden

## 2020-01-06 NOTE — Progress Notes (Signed)
Referring Physician(s): Gross,S/Bell,E  Supervising Physician: Jacqulynn Cadet  Patient Status:  [facility] - In-pt  Chief Complaint: Recurrent pelvic fluid collection, bilateral hydronephrosis, flank pain   Subjective: Patient without new complaints; just returned from the restroom   Allergies: Nsaids  Medications: Prior to Admission medications   Medication Sig Start Date End Date Taking? Authorizing Provider  cyanocobalamin (,VITAMIN B-12,) 1000 MCG/ML injection Inject 1,000 mcg into the muscle every 30 (thirty) days.  12/28/13  Yes [provider]  docusate sodium (COLACE) 100 MG capsule Take 1 capsule (100 mg total) by mouth 2 (two) times daily as needed for mild constipation. 11/19/19  Yes Antonieta Pert, MD  DULoxetine (CYMBALTA) 30 MG capsule Take 30-60 mg by mouth See admin instructions. Take 2 capsules in the morning and 1 capsule in the evening 08/12/17  Yes [provider]  gabapentin (NEURONTIN) 400 MG capsule Take 800 mg by mouth at bedtime.   Yes [provider]  HYDROcodone-acetaminophen (NORCO/VICODIN) 5-325 MG tablet Take 1-2 tablets by mouth every 4 (four) hours as needed for up to 10 doses for moderate pain or severe pain (1 moderate, 2 severe). 11/19/19  Yes Antonieta Pert, MD  hydrOXYzine (ATARAX/VISTARIL) 10 MG tablet Take 1 tablet (10 mg total) by mouth 3 (three) times daily as needed for itching or anxiety. 10/26/19  Yes Mikhail, Maryann, DO  midodrine (PROAMATINE) 2.5 MG tablet Take 2 tablets (5 mg total) by mouth 3 (three) times daily with meals. Patient taking differently: Take 2.5-5 mg by mouth 3 (three) times daily as needed (hypotension).  11/19/19  Yes Antonieta Pert, MD  Multiple Vitamin (MULTIVITAMIN WITH MINERALS) TABS tablet Take 1 tablet by mouth daily. 10/27/19  Yes Mikhail, Velta Addison, DO  tamsulosin (FLOMAX) 0.4 MG CAPS capsule Take 0.4 mg by mouth daily.    Yes [provider]  feeding supplement, ENSURE ENLIVE, (ENSURE ENLIVE)  LIQD Take 237 mLs by mouth 3 (three) times daily between meals. Patient not taking: Reported on 12/24/2019 10/26/19   Cristal Ford, DO     Vital Signs: BP (!) 152/76 (BP Location: Left Arm)   Pulse (!) 51   Temp 98.7 F (37.1 C) (Oral)   Resp 15   Ht 5\' 3"  (1.6 m)   Wt 144 lb 6.4 oz (65.5 kg)   SpO2 100%   BMI 25.58 kg/m   Physical Exam awake, alert.  Left and right nephrostomies as well as pelvic drain in place, output from left nephrostomy yesterday 2 L, today 150 cc slightly blood-tinged urine, output from right nephrostomy 1.3 L yesterday, 175 cc today of yellow urine, pelvic drain output 25 cc serosanguineous fluid  Imaging: No results found.  Labs:  CBC: Recent Labs    01/03/20 0506 01/04/20 0453 01/05/20 0558 01/06/20 0507  WBC 8.6 8.0 5.2 5.5  HGB 8.9* 8.8* 9.0* 8.4*  HCT 27.8* 27.9* 29.2* 27.8*  PLT 164 161 143* 143*    COAGS: Recent Labs    10/17/19 1241 10/17/19 1241 10/17/19 1311 10/17/19 1311 11/07/19 1836 11/07/19 1853 11/10/19 1052 12/08/19 1258 12/27/19 0450  INR 1.5*   < > 1.6*   < > 1.2  --  1.3* 1.1 1.3*  APTT 43*  --  46*  --   --  31  --   --   --    < > = values in this interval not displayed.    BMP: Recent Labs    01/01/20 0430 01/01/20 0430 01/02/20 0427 01/02/20 0427 01/03/20 0506 01/04/20  4081 01/05/20 0558 01/06/20 0507  NA 143   < > 144   < > 143 143 142 141  K 3.5   < > 3.9   < > 3.9 3.9 4.3 5.1  CL 116*   < > 113*   < > 112* 112* 108 110  CO2 18*   < > 19*   < > 22 23 26 24   GLUCOSE 73   < > 84   < > 81 77 140* 83  BUN 60*   < > 61*   < > 53* 47* 53* 52*  CALCIUM 7.3*   < > 7.5*   < > 7.5* 7.4* 7.5* 7.5*  CREATININE 6.54*   < > 6.27*   < > 6.42* 5.74* 5.94* 6.19*  GFRNONAA 9*   < > 9*   < > 9* 10* 10* 9*  GFRAA 10*  --  11*  --  10* 12*  --   --    < > = values in this interval not displayed.    LIVER FUNCTION TESTS: Recent Labs    11/07/19 1836 11/07/19 1836 11/10/19 0932 11/10/19 0932 12/25/19 0217  12/25/19 0217 12/28/19 0503 12/30/19 0603 01/03/20 0506 01/04/20 0453 01/05/20 0558 01/06/20 0507  BILITOT 0.6  --  0.4  --  0.3  --  0.3  --   --   --   --   --   AST 36  --  17  --  44*  --  28  --   --   --   --   --   ALT 53*  --  41  --  76*  --  8  --   --   --   --   --   ALKPHOS 180*  --  111  --  122  --  87  --   --   --   --   --   PROT 7.5  --  5.6*  --  6.7  --  6.0*  --   --   --   --   --   ALBUMIN 2.7*   < > 1.8*   < > 2.7*   < > 2.2*   < > 1.9* 1.9* 2.0* 1.9*   < > = values in this interval not displayed.    Assessment and Plan: Patient with history of Hodgkin's lymphoma/BPH/colovesical fistula, chronic kidney disease, bladder outlet obstruction, funguria, obstructive bilateral hydronephrosis despite bilateral ureteral stents- right stent removed 9/25, status post bilateral nephrostomies on 9/27; prior drainage of pelvic fluid collection on 11/15/19 with neg fistulagram and drain removal on 9/3; now with CT A/P findings  of: 1. Interval placement of bilateral percutaneous nephrostomies. Left ureteral stent is in grossly good position. Right ureteral stent noted on prior exam has been removed. No definite hydronephrosis or renal obstruction is seen at this time. 2. Perivesical fluid collection is again noted, which currently measures 6.0 x 4.3 cm in the transverse orientation which may be slightly enlarged compared to prior exam. Fluid within urinary bladder appears to be high density suggesting possible hemorrhage. 3. Small bilateral pleural effusions are noted with adjacent subsegmental atelectasis. 4. Mild anasarca. 5. Aortic atherosclerosis.   Status post left transgluteal approach 12 French drain cath placement into retrovesicular fluid collection on 10/1; afebrile; WBC normal, hemoglobin 8.4 down from nine, creatinine 6.19 up from 5.94, abscess cultures with few Candida; continue drain irrigations and output monitoring; patient to maintain all drains  for now  until planned surgical intervention; will need attempted right ureteral stent placement closer to time of surgery   Electronically Signed: D. Rowe Robert, PA-C 01/06/2020, 12:45 PM   I spent a total of 15 minutes at the the patient's bedside AND on the patient's hospital floor or unit, greater than 50% of which was counseling/coordinating care for bilateral nephrostomies and pelvic fluid collection drain    Patient ID: Chase [name], male   DOB: 1966/03/18, 54 y.o.   MRN: [id number]

## 2020-01-06 NOTE — TOC Progression Note (Signed)
Transition of Care [facility]) - Progression Note    Patient Details  Name: Chase [name] MRN: [id number] Date of Birth: 06-Feb-1966  Transition of Care [facility]) CM/SW Contact  Purcell Mouton, RN Phone Number: 01/06/2020, 1:28 PM  Clinical Narrative:    Claudina Lick was called to confirm pt's return to SNF, with [facility] and IV ABX. Pt's [facility] expired for SNF and [facility] Coord[facility] will check to see if they can take [facility].  [facility] was called, left VM for Charlean Sanfilippo [phone], and CSW Joelene Millin [phone] to request authorization for SNF. [facility] Coordinated called and are not able to take [employer].    Expected Discharge Plan: [place] Barriers to Discharge: Continued Medical Work up  Expected Discharge Plan and Services Expected Discharge Plan: [place] In-house Referral: Clinical Social Work     Living arrangements for the past 2 months: [place] Expected Discharge Date:  (unknown)                                     Social Determinants of Health (SDOH) Interventions    Readmission Risk Interventions Readmission Risk Prevention Plan 12/25/2019 11/19/2019  Transportation Screening Complete Complete  Medication Review Press photographer) Complete Complete  PCP or Specialist appointment within 3-5 days of discharge - Complete  HRI or [place] - Complete  SW Recovery Care/Counseling Consult - Complete  [place] - Complete  Some recent data might be hidden

## 2020-01-06 NOTE — Progress Notes (Signed)
PROGRESS NOTE    Chase [name]  YTK:[id number] DOB: [birth year] DOA: 12/24/2019 PCP: [name]   Chief Complaint  Patient presents with   Hematuria    Brief Narrative:  Chase [name] is Chase [name] 54 year old male with PMH of s/p cystoscopy with cystogram, bilateral retrograde pyelogram, likely colovesical fistula, left ureteral stent 11/15/2019, right ureteral stent was unable to be placed due to ureteral tortuosity, underwent right PC nephrostomy tube followed by internalization of right-sided stent, indwelling Foley catheter, CKD, GI bleed, chronic pain, gastric bypass, presented from SNF due to hematuria, dysuria, nausea, poor appetite and fatigue.   CT abdomen on admission showed severe bilateral hydronephrosis and possible colovesical fistula. On admission creatinine 7.1 (baseline 2.4-2.8).  Admitted for acute on chronic kidney disease, severe anion gap metabolic acidosis, hyperkalemia, bilateral hydronephrosis. Nephrology and urology consulting. 9/25, s/p left ureteral stent replaced but right ureteral stent unsuccessful. Bilateral percutaneous nephrostomies placed by IR 12/27/19.  His creatinine peaked at 8.47 and started to show some signs of downtrending. Urine output remained adequate consistent with ATN/diuretic phase. He underwent repeat CT abdomen/pelvis on 12/30/2019 which revealed "Perivesical fluid collection is again noted, which currently measures 6.0 x 4.3 cm in the transverse orientation which may be slightly enlarged compared to prior exam".  IR was consulted for possible drain placement into fluid collection.  He underwent successful drain placement on 12/31/2019 and cultures were sent for testing. Infectious disease was also consulted and patient was started on meropenem and continued with fluconazole while cultures further matured. He was also evaluated by GI and tentative plans are for colonoscopy the day prior to fistula repair if able to be coordinated in  that manner.  Assessment & Plan:   Principal Problem:   Acute renal failure superimposed on stage 4 chronic kidney disease (HCC) Active Problems:   CKD (chronic kidney disease), stage IV (HCC)   Chronic pain   Cerebral embolism with cerebral infarction   Protein-calorie malnutrition, severe   BPH (benign prostatic hyperplasia)   Colovesical fistula   Pelvic abscess with colovescial fistula   Anemia   History of COVID-19   History of gastric bypass   Iron deficiency anemia   Pernicious anemia   PTSD (post-traumatic stress disorder)   AKI (acute kidney injury) ([place])   Bilateral ureteral obstruction s/p perc nephrosotmy & stenting   ATN (acute tubular necrosis) (HCC)   Candida UTI   Electrolyte abnormality   Increased anion gap metabolic acidosis   Microcytic anemia   ABLA (acute blood loss anemia)   Anxiety and depression   History of CVA (cerebrovascular accident)   BMI 24.0-24.9, adult   Chronic narcotic use   Anemia in chronic kidney disease   Incisional hernia - epigastric  Acute renal failure superimposed on stage 4 chronic kidney disease (HCC) Nephrologyfollowing, appreciate assistance. Baseline creatinine 2.4-2.8. Presented with creatinine of 7.1 and peaked at 8.47. CT abdomen showed bilateral severe hydronephrosis. Foley catheter in place. - s/p left ureteral stent replacement on 9/25 (right ureteral stent unsuccessful) - per urology, would recommend IR to internalize Chase [name] ureteral stent at some point in preparation for repair of colovesical fistula to identify ureters intraoperatively - s/p bilateral perc nephrostomy tubes by IR 9/27 - tubes to remain in place until colovesicular fistula repair completed -Appears to be in his post obstructive diuretic phase -renal following, appreciate assistance -appreciate urology recommendations - renal function worsened today, creatinine 6.19, 3.7 L out yesterday, continue IVF  Pelvic abscess with colovescial fistula -  greatly appreciate multi-modality input (surgery, nephrology, urology, IR, ID, GI) - definitive repair still being determined; per surgery note 10/5, plan to return to rehab when stable, will need follow up with [name] for surgical planning (planning for this outpatient per my discussion with surgery) - s/p IR pelvic abscess drain on 10/1 (culture growing "few candida albicans"); ID consulted; patient now on meropenem and fluconazole (C. Albicans growing in L/[name] nephrostomy tube cultures and as noted now the deep pelvic culture is growing candida albicans from 10/1).  Plan for 14 days fluconazole + meropenem.  (per surgery, recommending continue abx until surgery - will discuss with surgery) - if he does spike fever or significant leukocytosis, needs blood cultures - evaluated by GI on 10/2; tentative plan is for CLN the day prior to fistula repair; timing of this is still to be determined  - need to clarify with surgery if plan is inpatient repair vs awaiting further renal recovery then outpatient repair; patient also asking if his hernia could be repaired at the same time  Bilateral ureteral obstruction s/p perc nephrosotmy & stenting -Bilateral nephrostomy tubes to remain in place per urology until definitive repair of colovesical fistula -Also recommendations per urology are for IR to internalize right ureteral stent in preparation for fistula repair in order to identify ureters intraoperatively -Indwelling Foley catheter to remain in place with catheter exchange every 4 to 6 weeks until definitive surgery for fistula and bladder repair  History of CVA (cerebrovascular accident) -CVA on 10/18/2019 MRI. Small bilateral acute cerebral and cerebellar infarcts. Residual left hemiparesis  Electrolyte abnormality Hyperkalemia, hypokalemia, hypocalcemia, hyperphosphatemia -Treating appropriately as indicated per each - iPTH = 50 - continue sevelamer - IVF changed to LR per nephrology    Candida UTI - recent urological interventions, dysuria although he is afebrile and without leukocytosis. Previous urine cultures had shown E. coli and Pseudomonas. Patient was started on IV Zosyn. However urine culture then showed 70 K colonies of yeast, speciated to C. Albicans - remains on fluconazole; ID now formally consulted, continue to follow recommendations - see ID note from 10/4; plan is to complete 14 day course of Fluconazole and meropenem; end date 01/14/20; if gets discharged prior to course completion needs IR to place CVL (cannot have PICC due to renal status)  BMI 24.0-24.9, adult - continue supplements and MVI  Anxiety and depression - resume cymbalta if renal function returns near baseline  ABLA (acute blood loss anemia) - see microcytic anemia  Microcytic anemia - has required 2 units PRBC so far; Hgb still slowly downtrending - continue to monitor and will transfuse further as needed  Increased anion gap metabolic acidosis Bicarbonate drip discontinued and started oral bicarbonate 9/27. - also on LR  ATN (acute tubular necrosis) ([place]) - see AoCKDIV  AKI (acute kidney injury) ([place]) - see AoCKDIV   DVT prophylaxis: SCD Code Status: full  Family Communication: none at bedside Disposition:   Status is: Inpatient  Remains inpatient appropriate because:Inpatient level of care appropriate due to severity of illness   Dispo: The patient is from: Home              Anticipated d/c is to: SNF              Anticipated d/c date is: > 3 days              Patient currently is not medically stable to d/c.       Consultants:   Urology, Nephrology, GI,  IR, ID, surgery  Procedures:  See previous notes and above  Antimicrobials:  Anti-infectives (From admission, onward)   Start     Dose/Rate Route Frequency Ordered Stop   01/01/20 1000  fluconazole (DIFLUCAN) tablet 200 mg        200 mg Oral Daily 12/31/19 1618 01/15/20 0959   12/31/19 1800   meropenem (MERREM) 500 mg in sodium chloride 0.9 % 100 mL IVPB        500 mg 200 mL/hr over 30 Minutes Intravenous Every 12 hours 12/31/19 1545 01/14/20 2159   12/27/19 1541  ciprofloxacin (CIPRO) 400 MG/200ML IVPB       Note to Pharmacy: Hilma Favors   : cabinet override      12/27/19 1541 12/28/19 0741   12/27/19 1333  fluconazole (DIFLUCAN) tablet 200 mg        200 mg Oral Daily 12/27/19 1333 12/31/19 0913   12/27/19 1300  fluconazole (DIFLUCAN) tablet 400 mg  Status:  Discontinued        400 mg Oral Daily 12/27/19 1213 12/27/19 1214   12/27/19 1300  fluconazole (DIFLUCAN) tablet 200 mg  Status:  Discontinued        200 mg Oral Daily 12/27/19 1214 12/27/19 1333   12/25/19 1600  piperacillin-tazobactam (ZOSYN) IVPB 2.25 g  Status:  Discontinued        2.25 g 100 mL/hr over 30 Minutes Intravenous Every 8 hours 12/25/19 1226 12/27/19 1213   12/25/19 1132  ceFAZolin (ANCEF) 2-4 GM/100ML-% IVPB       Note to Pharmacy: [place]  : cabinet override      12/25/19 1132 12/25/19 2344     Subjective: No new complaints  Objective: Vitals:   01/06/20 0500 01/06/20 0602 01/06/20 1236 01/06/20 1239  BP:  132/78 (!) 160/85 (!) 152/76  Pulse:  (!) 56 (!) 53 (!) 51  Resp:   15   Temp:  98.8 F (37.1 C) 98.7 F (37.1 C)   TempSrc:  Oral Oral   SpO2:  98% 100% 100%  Weight: 65.5 kg     Height:        Intake/Output Summary (Last 24 hours) at 01/06/2020 1533 Last data filed at 01/06/2020 1432 Gross per 24 hour  Intake 4059.73 ml  Output 4055 ml  Net 4.73 ml   Filed Weights   12/30/19 0456 12/31/19 0500 01/06/20 0500  Weight: 59.9 kg 61.8 kg 65.5 kg    Examination:  General: No acute distress. Cardiovascular: Heart sounds show Izabel Chim regular rate, and rhythm. Lungs: Clear to auscultation bilaterally Abdomen: Soft, nontender, nondistended.  Bilateral nephrostomy tubes, jp drain. Neurological: Alert and oriented 3. Moves all extremities 4. Cranial nerves II through XII  grossly intact. Skin: Warm and dry. No rashes or lesions. Extremities: No clubbing or cyanosis. No edema.    Data Reviewed: I have personally reviewed following labs and imaging studies  CBC: Recent Labs  Lab 01/02/20 0427 01/03/20 0506 01/04/20 0453 01/05/20 0558 01/06/20 0507  WBC 9.8 8.6 8.0 5.2 5.5  NEUTROABS 6.9 5.8 5.8 3.2 2.7  HGB 7.6* 8.9* 8.8* 9.0* 8.4*  HCT 24.3* 27.8* 27.9* 29.2* 27.8*  MCV 96.4 95.5 96.2 98.6 99.6  PLT 177 164 161 143* 143*    Basic Metabolic Panel: Recent Labs  Lab 01/02/20 0427 01/03/20 0506 01/04/20 0453 01/05/20 0558 01/06/20 0507  NA 144 143 143 142 141  K 3.9 3.9 3.9 4.3 5.1  CL 113* 112* 112* 108 110  CO2 19* 22 23  26 24  GLUCOSE 84 81 77 140* 83  BUN 61* 53* 47* 53* 52*  CREATININE 6.27* 6.42* 5.74* 5.94* 6.19*  CALCIUM 7.5* 7.5* 7.4* 7.5* 7.5*  MG 2.1 2.0 2.0 2.1 2.2  PHOS 5.9* 5.6* 4.4 4.2 4.6    GFR: Estimated Creatinine Clearance: 10.7 mL/min (Ursula Dermody) (by C-G formula based on SCr of 6.19 mg/dL (H)).  Liver Function Tests: Recent Labs  Lab 01/02/20 0427 01/03/20 0506 01/04/20 0453 01/05/20 0558 01/06/20 0507  ALBUMIN 1.9* 1.9* 1.9* 2.0* 1.9*    CBG: No results for input(s): GLUCAP in the last 168 hours.   Recent Results (from the past 240 hour(s))  Aerobic/Anaerobic Culture (surgical/deep wound)     Status: None   Collection Time: 12/27/19  4:26 PM   Specimen: Abscess  Result Value Ref Range Status   Specimen Description   Final    ABSCESS LEFT KIDNEY Performed at [facility] [address], [place], [place] [address]    Special Requests   Final    NONE Performed at [facility], [place] [address], [place], [place] [address]    Gram Stain   Final    MODERATE WBC PRESENT, PREDOMINANTLY MONONUCLEAR FEW YEAST    Culture   Final    FEW CANDIDA ALBICANS NO ANAEROBES ISOLATED Performed at [facility], [place] [address], [place], [place] [address]    Report Status  01/01/2020 FINAL  Final  Aerobic/Anaerobic Culture (surgical/deep wound)     Status: None   Collection Time: 12/27/19  4:36 PM   Specimen: Abscess  Result Value Ref Range Status   Specimen Description   Final    ABSCESS RIGHT KIDNEY Performed at [facility] [address], [place], [place] [address]    Special Requests   Final    NONE Performed at [facility], [place] [address], [place], [place] [address]    Gram Stain   Final    RARE WBC PRESENT,BOTH PMN AND MONONUCLEAR NO ORGANISMS SEEN    Culture   Final    FEW CANDIDA ALBICANS NO ANAEROBES ISOLATED Performed at [facility], [place] [address], [place], [place] [address]    Report Status 01/02/2020 FINAL  Final  Aerobic/Anaerobic Culture (surgical/deep wound)     Status: None   Collection Time: 12/31/19  1:06 PM   Specimen: Abscess  Result Value Ref Range Status   Specimen Description   Final    ABSCESS Performed at [facility] [address], [place], [place] [address]    Special Requests   Final    Normal Performed at [facility], [place] [address], [place], [place] [address]    Gram Stain   Final    MANY WBC PRESENT, PREDOMINANTLY PMN NO ORGANISMS SEEN    Culture   Final    FEW CANDIDA ALBICANS NO ANAEROBES ISOLATED Performed at [facility], [place] [address], [place], [place] [address]    Report Status 01/05/2020 FINAL  Final         Radiology Studies: No results found.      Scheduled Meds:  sodium chloride   Intravenous Once   bisacodyl  10 mg Rectal Daily   Chlorhexidine Gluconate Cloth  6 each Topical Daily   feeding supplement  1 Container Oral TID BM   fluconazole  200 mg Oral Daily   gabapentin  200 mg Oral Daily   multivitamin with minerals  1  tablet Oral Daily   nicotine  14 mg Transdermal Daily   sevelamer carbonate  800 mg Oral TID WC   sodium bicarbonate  1,300 mg Oral BID   sodium  chloride flush  5 mL Intracatheter Q8H   sodium chloride flush  5 mL Intracatheter Q8H   tamsulosin  0.4 mg Oral Daily   Continuous Infusions:  lactated ringers 125 mL/hr at 01/06/20 0929   meropenem (MERREM) IV 500 mg (01/06/20 1004)     LOS: 13 days    Time spent: over 30 min    Fayrene Helper, MD [facility]   To contact the attending provider between 7A-7P or the covering provider during after hours 7P-7A, please log into the web site [email or web address] and access using universal [place] password for that web site. If you do not have the password, please call the hospital operator.  01/06/2020, 3:33 PM

## 2020-01-06 NOTE — Progress Notes (Signed)
Physical Therapy Treatment Patient Details Name: Chase [name] MRN: [id number] DOB: Nov 07, 1965 Today's Date: 01/06/2020    History of Present Illness 54 yo male admitted from SNF ([facility]) with AKI s/p bil PCN 9/27, colovesical fistula. 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  Pt looking forward to returning to SNF for more intense therapy (stairs, weights, etc) and then progressing to home with his 2 dogs.     Follow Up Recommendations  SNF     Equipment Recommendations  None recommended by PT    Recommendations for Other Services       Precautions / Restrictions Precautions Precautions: Fall Precaution Comments: left and right PCN tubes, left bulb and foley cath    Mobility  Bed Mobility Overal bed mobility: Modified Independent                Transfers Overall transfer level: Needs assistance Equipment used: None Transfers: Sit to/from Stand Sit to Stand: Supervision            Ambulation/Gait Ambulation/Gait assistance: Supervision Gait Distance (Feet): 300 Feet Assistive device: Rolling walker (2 wheeled) Gait Pattern/deviations: Step-through pattern;Decreased stride length     General Gait Details: supervision for safety with multiple drains, IV, heart monitor; reports weakness and feels he still does need RW for UE support   Stairs             Wheelchair Mobility    Modified Rankin (Stroke Patients Only)       Balance                                            Cognition Arousal/Alertness: Awake/alert Behavior During Therapy: WFL for tasks assessed/performed Overall Cognitive Status: Within Functional Limits for tasks assessed                                        Exercises      General Comments        Pertinent Vitals/Pain Pain Assessment: No/denies pain    Home Living                       Prior Function            PT Goals (current goals can now be found in the care plan section) Progress towards PT goals: Progressing toward goals    Frequency    Min 2X/week      PT Plan Current plan remains appropriate    Co-evaluation              AM-PAC PT "6 Clicks" Mobility   Outcome Measure  Help needed turning from your back to your side while in a flat bed without using bedrails?: A Little Help needed moving from lying on your back to sitting on the side of a flat bed without using bedrails?: A Little Help needed moving to and from a bed to a chair (including a wheelchair)?: A Little Help needed standing up from a chair using your arms (e.g., wheelchair or bedside chair)?: A Little Help needed to walk in hospital room?: A Little Help needed climbing 3-5 steps with a railing? : A Lot 6  Click Score: 17    End of Session Equipment Utilized During Treatment: Gait belt Activity Tolerance: Patient tolerated treatment well Patient left: in chair;with nursing/sitter in room;with call bell/phone within reach (nurse tech in room to change linen)   PT Visit Diagnosis: Difficulty in walking, not elsewhere classified (R26.2);Muscle weakness (generalized) (M62.81)     Time: 2376-2831 PT Time Calculation (min) (ACUTE ONLY): 25 min  Charges:  $Gait Training: 8-22 mins                     Arlyce Dice, DPT Acute Rehabilitation Services Pager: [phone] Office: [phone]  [name] E 01/06/2020, 1:58 PM

## 2020-01-06 NOTE — TOC Progression Note (Signed)
Transition of Care [facility]) - Progression Note    Patient Details  Name: Chase [name] MRN: [id number] Date of Birth: July 14, 1965  Transition of Care [facility]) CM/SW Contact  Purcell Mouton, RN Phone Number: 01/06/2020, 4:03 PM  Clinical Narrative:     Spoke with pt's son who states that pt will go back to SNF. Will need authorization from the [place].  At present time no SNF will take a [employer].   Expected Discharge Plan: [place] Barriers to Discharge: Continued Medical Work up  Expected Discharge Plan and Services Expected Discharge Plan: [place] In-house Referral: Clinical Social Work     Living arrangements for the past 2 months: [place] Expected Discharge Date:  (unknown)                                     Social Determinants of Health (SDOH) Interventions    Readmission Risk Interventions Readmission Risk Prevention Plan 12/25/2019 11/19/2019  Transportation Screening Complete Complete  Medication Review Press photographer) Complete Complete  PCP or Specialist appointment within 3-5 days of discharge - Complete  HRI or [place] - Complete  SW Recovery Care/Counseling Consult - Complete  [place] - Complete  Some recent data might be hidden

## 2020-01-07 LAB — RENAL FUNCTION PANEL
Albumin: 2 g/dL — ABNORMAL LOW (ref 3.5–5.0)
Anion gap: 7 (ref 5–15)
BUN: 50 mg/dL — ABNORMAL HIGH (ref 6–20)
CO2: 26 mmol/L (ref 22–32)
Calcium: 7.8 mg/dL — ABNORMAL LOW (ref 8.9–10.3)
Chloride: 109 mmol/L (ref 98–111)
Creatinine, Ser: 5.99 mg/dL — ABNORMAL HIGH (ref 0.61–1.24)
GFR calc non Af Amer: 10 mL/min — ABNORMAL LOW (ref >60)
Glucose, Bld: 80 mg/dL (ref 70–99)
Phosphorus: 4.8 mg/dL — ABNORMAL HIGH (ref 2.5–4.6)
Potassium: 5.3 mmol/L — ABNORMAL HIGH (ref 3.5–5.1)
Sodium: 142 mmol/L (ref 135–145)

## 2020-01-07 LAB — CBC WITH DIFFERENTIAL/PLATELET
Abs Immature Granulocytes: 0.02 10*3/uL (ref 0.00–0.07)
Basophils Absolute: 0 10*3/uL (ref 0.0–0.1)
Basophils Relative: 1 %
Eosinophils Absolute: 0.4 10*3/uL (ref 0.0–0.5)
Eosinophils Relative: 7 %
HCT: 28.8 % — ABNORMAL LOW (ref 39.0–52.0)
Hemoglobin: 8.8 g/dL — ABNORMAL LOW (ref 13.0–17.0)
Immature Granulocytes: 0 %
Lymphocytes Relative: 32 %
Lymphs Abs: 1.7 10*3/uL (ref 0.7–4.0)
MCH: 30.3 pg (ref 26.0–34.0)
MCHC: 30.6 g/dL (ref 30.0–36.0)
MCV: 99.3 fL (ref 80.0–100.0)
Monocytes Absolute: 0.5 10*3/uL (ref 0.1–1.0)
Monocytes Relative: 10 %
Neutro Abs: 2.7 10*3/uL (ref 1.7–7.7)
Neutrophils Relative %: 50 %
Platelets: 163 10*3/uL (ref 150–400)
RBC: 2.9 MIL/uL — ABNORMAL LOW (ref 4.22–5.81)
RDW: 16.5 % — ABNORMAL HIGH (ref 11.5–15.5)
WBC: 5.3 10*3/uL (ref 4.0–10.5)
nRBC: 0 % (ref 0.0–0.2)

## 2020-01-07 LAB — HEPATIC FUNCTION PANEL
ALT: 19 U/L (ref 0–44)
AST: 37 U/L (ref 15–41)
Albumin: 2 g/dL — ABNORMAL LOW (ref 3.5–5.0)
Alkaline Phosphatase: 86 U/L (ref 38–126)
Bilirubin, Direct: <0.1 mg/dL (ref 0.0–0.2)
Total Bilirubin: 0.3 mg/dL (ref 0.3–1.2)
Total Protein: 5.7 g/dL — ABNORMAL LOW (ref 6.5–8.1)

## 2020-01-07 LAB — RESPIRATORY PANEL BY RT PCR (FLU A&B, COVID)
Influenza A by PCR: NEGATIVE
Influenza B by PCR: NEGATIVE
SARS Coronavirus 2 by RT PCR: NEGATIVE

## 2020-01-07 LAB — MAGNESIUM: Magnesium: 2 mg/dL (ref 1.7–2.4)

## 2020-01-07 MED ORDER — RENA-VITE PO TABS
1.0000 | ORAL_TABLET | Freq: Every day | ORAL | Status: DC
  Administered 2020-01-07: 1 via ORAL
  Filled 2020-01-07: qty 1

## 2020-01-07 MED ORDER — PROSOURCE PLUS PO LIQD
30.0000 mL | Freq: Two times a day (BID) | ORAL | Status: DC
  Filled 2020-01-07: qty 30

## 2020-01-07 MED ORDER — SODIUM ZIRCONIUM CYCLOSILICATE 10 G PO PACK
10.0000 g | PACK | Freq: Once | ORAL | Status: AC
  Administered 2020-01-07: 10 g via ORAL
  Filled 2020-01-07: qty 1

## 2020-01-07 MED ORDER — SODIUM BICARBONATE 650 MG PO TABS
650.0000 mg | ORAL_TABLET | Freq: Two times a day (BID) | ORAL | Status: DC
  Administered 2020-01-07 – 2020-01-08 (×3): 650 mg via ORAL
  Filled 2020-01-07 (×3): qty 1

## 2020-01-07 NOTE — TOC Progression Note (Signed)
Transition of Care [facility]) - Progression Note    Patient Details  Name: Reg Bircher MRN: [id number] Date of Birth: 1965/05/19  Transition [facility]) CM/SW Contact  Purcell Mouton, RN Phone Number: 01/07/2020, 11:22 AM  Clinical Narrative:     Checked with three other SNF's concerning them taking a pt with a Tunnel Line.  Each facility declined Tunnel Lines related to the safety of the pt. [facility] request was sent yesterday for SNF authorization. [facility] declined pt related to pt being at SNF level.   Expected Discharge Plan: [place] Barriers to Discharge: Continued Medical Work up  Expected Discharge Plan and Services Expected Discharge Plan: [place] In-house Referral: Clinical Social Work     Living arrangements for the past 2 months: [place] Expected Discharge Date:  (unknown)                                     Social Determinants of Health (SDOH) Interventions    Readmission Risk Interventions Readmission Risk Prevention Plan 12/25/2019 11/19/2019  Transportation Screening Complete Complete  Medication Review Press photographer) Complete Complete  PCP or Specialist appointment within 3-5 days of discharge - Complete  HRI or [place] - Complete  SW Recovery Care/Counseling Consult - Complete  [place] - Complete  Some recent data might be hidden

## 2020-01-07 NOTE — TOC Progression Note (Signed)
Transition of Care [facility]) - Progression Note    Patient Details  Name: Chase [name] MRN: [id number] Date of Birth: [birth year]  Transition of Care [facility]) CM/SW Contact  Purcell Mouton, RN Phone Number: 01/07/2020, 3:13 PM  Clinical Narrative:     Checked with [facility] concerning Peripheral IV. [facility] will take pt with Peripheral IV and follow there protocol for IV's. The [facility] gave authorization to [facility] for 30 days. Pt will need rapid COVID test before returning. [facility] will take pt tomorrow if pt is ready or Monday.  Expected Discharge Plan: [place] Barriers to Discharge: Continued Medical Work up  Expected Discharge Plan and Services Expected Discharge Plan: [place] In-house Referral: Clinical Social Work     Living arrangements for the past 2 months: [place] Expected Discharge Date:  (unknown)                                     Social Determinants of Health (SDOH) Interventions    Readmission Risk Interventions Readmission Risk Prevention Plan 12/25/2019 11/19/2019  Transportation Screening Complete Complete  Medication Review Press photographer) Complete Complete  PCP or Specialist appointment within 3-5 days of discharge - Complete  HRI or [place] - Complete  SW Recovery Care/Counseling Consult - Complete  [place] - Complete  Some recent data might be hidden

## 2020-01-07 NOTE — Progress Notes (Signed)
Nutrition Follow-up  DOCUMENTATION CODES:   Not applicable  INTERVENTION:  - will d/c Boost Breeze. - will order 30 ml Prosource Plus BID, each supplement provides 100 kcal and 15 grams protein.  - will d/c 1 tablet multivitamin with minerals/day and will order 1 tablet rena-vite/day.  - will attempt NFPE at follow-up.   NUTRITION DIAGNOSIS:   Inadequate oral intake related to acute illness (bilateral hydronephrosis) as evidenced by meal completion < 25% -improving  GOAL:   Patient will meet greater than or equal to 90% of their needs -not yet met  MONITOR:   PO intake, Supplement acceptance, Labs, Weight trends  ASSESSMENT:   54 year old male with history of bladder outlet obstruction s/p ureteral stents on 9/08, cervical radiculopathy with left sided weakness, chronic pain, PTSD, CKD, and gastric bypass presented with 3 days of worsening hematuria, nausea, poor appetite, and fatigue.  Diet changed from Renal to Regular on 10/3 and then back to Renal yesterday at 1545 before changing back to Regular today at 1230. He was eating 75-100% of meals 10/5-breakfast on 10/7 and then ate 40% of lunch and dinner 10/7 and 0% of breakfast this AM. Boost Breeze ordered TID and he has been refusing nearly all.   Patient sleeping soundly with no family/visitors present. He did not awake to name call. Lunch tray on bedside. A bottle of Nepro and Boost Plus both on bedside table and are both full.   Weight has been fluctuating since admission on 9/24. No information documented in the edema section of flow sheet.  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 Medications reviewed; 10 mg rectal dulcolax/day, 800 mg renvela TID, 650 mg sodium bicarb BID, 10 g lokelma x1 dose 10/8. IVF; 1/2 NS @ 50 ml/hr.     NUTRITION - FOCUSED PHYSICAL EXAM:  unable to complete at this time.   Diet Order:   Diet Order            Diet regular Room service appropriate? Yes; Fluid consistency: Thin  Diet effective now                 EDUCATION NEEDS:   No education needs have been identified at this time  Skin:  Skin Assessment: Skin Integrity Issues: Skin Integrity Issues:: Stage II Stage II: upper coccyx (11/08/19)  Last BM:  10/7  Height:   Ht Readings from Last 1 Encounters:  12/25/19 _0  (1.6 m)    Weight:   Wt Readings from Last 1 Encounters:  01/06/20 63.1 kg    Estimated Nutritional Needs:  Kcal:  1500-1700 Protein:  75-85 Fluid:  >/= 1.5 L/day      Jarome Matin, MS, RD, LDN, CNSC Inpatient Clinical Dietitian RD pager # available in [facility]  After hours/weekend pager # available in [facility]

## 2020-01-07 NOTE — Progress Notes (Signed)
Chase [name] [id number] [birth year]  CARE TEAM:  PCP: [facility], [facility] Team: Patient Care Team: [facility], [name] as PCP - Starleen Arms, MD as Consulting Physician (Urology) Michael Boston, MD as Consulting Physician (General Surgery) Ronald Lobo, MD as Consulting Physician (Gastroenterology) Kandace Blitz, MD as Referring Physician (Pain Medicine) Mignon Pine, DO as Consulting Physician (Infectious Diseases)  Inpatient Treatment Team: Treatment Team: Attending Provider: Elodia Florence., MD; Consulting Physician: Raynelle Bring, MD; Consulting Physician: Edison Pace, Md, MD; Rounding Team: [facility], MD; Consulting Physician: Michael Boston, MD; Consulting Physician: Ronald Lobo, MD; Consulting Physician: Micheline Rough, MD; Rounding Team: Redmond Baseman, MD; Consulting Physician: Roney Jaffe, MD; Registered Nurse: Lonia Skinner, RN; Utilization Review: Beau Fanny, RN; Technician: Gildardo Pounds, NT; Registered Nurse: Baker Pierini, RN; Registered Nurse: Con Memos, RN; Consulting Physician: Claudia Desanctis, MD   Problem List:   Principal Problem:   Acute renal failure superimposed on stage 4 chronic kidney disease [facility]) Active Problems:   CKD (chronic kidney disease), stage IV ([facility])   Protein-calorie malnutrition, severe   Colovesical fistula   Pelvic abscess with colovescial fistula   History of gastric bypass   Bilateral ureteral obstruction s/p perc nephrosotmy & stenting   Cerebral embolism with cerebral infarction   History of CVA (cerebrovascular accident)   Chronic pain   BPH (benign prostatic hyperplasia)   Anemia   History of COVID-19   Iron deficiency anemia   Pernicious anemia   PTSD (post-traumatic stress disorder)   AKI (acute kidney injury) ([place])   ATN (acute tubular necrosis) (HCC)   Candida UTI   Electrolyte abnormality   Increased anion gap metabolic acidosis    Microcytic anemia   ABLA (acute blood loss anemia)   Anxiety and depression   BMI 24.0-24.9, adult   Chronic narcotic use   Anemia in chronic kidney disease   Incisional hernia - epigastric   13 Days Post-Op  12/25/2019  Procedure(s): CYSTOSCOPY WITH bilateral  RETROGRADE PYELOGRAM/ leftURETERAL STENT PLACEMENT    Assessment  Acute on severe chronic kidney disease related to hydronephrosis and ureteral obstruction most likely due to severe inflammation from (probable diverticular) pelvic abscess and colovesical fistula.  [facility] Stay = 14 days)  Plan:  With his readmission, surgery has been set back.  However he already looks more bright and alert and hemiparesis seems to be markedly resolved since last months admission (despite the worsening ARF/CKD).  At some point, the patient would benefit from resection the rectosigmoid colon and bladder repair of the colovesical fistula.  The tentative plan was to wait for infection to go down, renal function to improve, and rehabilitation from his stroke, ideally would wait 6 weeks after resolution of any abscess = [facility] office working to plan robotic resection in [place].  Low threshold for him to have a Hartmann resection or diverting colostomy if he worsens / fails to improve this admission with another operation in the future.  Another possibility would be immediate anastomosis with diverting ileostomy.  That will have to be determined on the day of surgery.  Antibiotics per infectious disease.  They recommend 2 weeks since drain placement.  Fluconazole for Candida Albicans.  Meropenem per ID.  Drain in recurrent pelvic abscess NEEDS TO STAY until he gets surgery.  Patient needs a colonoscopy at some point.   Gastroenterology does not want to attempt this admission and wishes to wait until the day before surgery.  Our office  working to coordinate with [facility] so that happens that way.  We are able obtain records from the [place].  Last  colonoscopy in 2016 at the [facility] showing no obvious lesions.  I left a printout in the patient's hard chart  MiraLAX to help clean out his severe constipation   Patient's biggest concern is losing his rehab/SNF spot at [facility] since he has enjoyed working with rehab through them & recovering from his stroke & ARF/CKD with severe deconditioning.  Case management and social work to help work with him - they are following closely.  I do agree that rehab is essential for minimal morbidity/mortality.  VTE prophylaxis- SCDs, etc  Mobilize as tolerated to help recovery  45 minutes spent in review, evaluation, examination, counseling, and coordination of care.  I reviewed the CT scan and all his most recent films with Dr Dema Severin, my colorectal partner.  Discussed with Dr. Alinda Money with use more than 50% of that time was spent in counseling.  01/07/2020    Subjective: (Chief complaint)  No major events.  Tolerating solids.  Working to transition back to rehab.  Objective:  Vital signs:  Vitals:   01/06/20 1239 01/06/20 1752 01/06/20 2139 01/07/20 0553  BP: (!) 152/76  (!) 141/74 (!) 153/72  Pulse: (!) 51  (!) 50 (!) 48  Resp:   18 20  Temp:   98.5 F (36.9 C) 99.2 F (37.3 C)  TempSrc:   Oral Oral  SpO2: 100%  96% 97%  Weight:  63.1 kg    Height:        Last BM Date: 01/06/20  Intake/Output   Yesterday:  10/07 0701 - 10/08 0700 In: 2121.7 [P.O.:1560; I.V.:456.7; IV Piggyback:100] Out: 9924 [QASTM:1962; Drains:20] This shift:  No intake/output data recorded.  Bowel function:  Flatus: YES  BM:  YES  Drain: Serosanguinous   Physical Exam:  General: Pt awake/alert in no acute distress.  Looks cachectic but a little better.  Markedly more animated and alert interactive. Eyes: PERRL, normal EOM.  Sclera clear.  No icterus Neuro: CN II-XII intact w/o focal sensory/motor deficits.  Moving upper extremities regularly easily with good handgrip  strength Lymph: No head/neck/groin lymphadenopathy Psych:  No delerium/psychosis/paranoia.  Oriented x 4 HENT: Normocephalic, Mucus membranes moist.  No thrush Neck: Supple, No tracheal deviation.  No obvious thyromegaly Chest: No pain to chest wall compression.  Good respiratory excursion.  No audible wheezing CV:  Pulses intact.  Regular rhythm.  No major extremity edema MS: Normal AROM mjr joints.  No obvious deformity  Abdomen: Soft.  Nondistended.  Nontender.  No evidence of peritonitis.  No incarcerated hernias.  Percutaneous nephrostomy tubes in place.  Right clear yellow.  Left serosanguineous. Foley in place  Transgluteal drain primarily serosanguinous  Ext:  No deformity.  No mjr edema.  No cyanosis Skin: No petechiae / purpurea.  No major sores.  Warm and dry    Results:   Cultures: Recent Results (from the past 720 hour(s))  Urine culture     Status: Abnormal   Collection Time: 12/24/19 10:33 AM   Specimen: Urine, Clean Catch  Result Value Ref Range Status   Specimen Description   Final    URINE, CLEAN CATCH Performed at [facility], [place] [address] [place], [place] [address]    Special Requests   Final    NONE Performed at [facility], [place] [address] [place], [place] [address]    Culture  70,000 COLONIES/mL YEAST (A)  Final   Report Status 12/26/2019 FINAL  Final  Respiratory Panel by RT PCR (Flu A&B, Covid) - Nasopharyngeal Swab     Status: None   Collection Time: 12/24/19  2:22 PM   Specimen: Nasopharyngeal Swab  Result Value Ref Range Status   SARS Coronavirus 2 by RT PCR NEGATIVE NEGATIVE Final    Comment: (NOTE) SARS-CoV-2 target nucleic acids are NOT DETECTED.  The SARS-CoV-2 RNA is generally detectable in upper respiratoy specimens during the acute phase of infection. The lowest concentration of SARS-CoV-2 viral copies this assay can detect is 131 copies/mL. A negative result does not preclude  SARS-Cov-2 infection and should not be used as the sole basis for treatment or other patient management decisions. A negative result may occur with  improper specimen collection/handling, submission of specimen other than nasopharyngeal swab, presence of viral mutation(s) within the areas targeted by this assay, and inadequate number of viral copies (<131 copies/mL). A negative result must be combined with clinical observations, patient history, and epidemiological information. The expected result is Negative.  Fact Sheet for Patients:  PinkCheek.be  Fact Sheet for Healthcare Providers:  GravelBags.it  This test is no t yet approved or cleared by the [place] FDA and  has been authorized for detection and/or diagnosis of SARS-CoV-2 by FDA under an Emergency Use Authorization (EUA). This EUA will remain  in effect (meaning this test can be used) for the duration of the COVID-19 declaration under Section 564(b)(1) of the Act, 21 U.S.C. section 360bbb-3(b)(1), unless the authorization is terminated or revoked sooner.     Influenza A by PCR NEGATIVE NEGATIVE Final   Influenza B by PCR NEGATIVE NEGATIVE Final    Comment: (NOTE) The Xpert Xpress SARS-CoV-2/FLU/RSV assay is intended as an aid in  the diagnosis of influenza from Nasopharyngeal swab specimens and  should not be used as a sole basis for treatment. Nasal washings and  aspirates are unacceptable for Xpert Xpress SARS-CoV-2/FLU/RSV  testing.  Fact Sheet for Patients: PinkCheek.be  Fact Sheet for Healthcare Providers: GravelBags.it  This test is not yet approved or cleared by the [place] FDA and  has been authorized for detection and/or diagnosis of SARS-CoV-2 by  FDA under an Emergency Use Authorization (EUA). This EUA will remain  in effect (meaning this test can be used) for the duration of the   Covid-19 declaration under Section 564(b)(1) of the Act, 21  U.S.C. section 360bbb-3(b)(1), unless the authorization is  terminated or revoked. Performed at [facility], [place] [address], [place], [place] [address]   Aerobic/Anaerobic Culture (surgical/deep wound)     Status: None   Collection Time: 12/27/19  4:26 PM   Specimen: Abscess  Result Value Ref Range Status   Specimen Description   Final    ABSCESS LEFT KIDNEY Performed at [place] [address], [place], [place] [address]    Special Requests   Final    NONE Performed at [facility], [place] [address], [place], [place] [address]    Gram Stain   Final    MODERATE WBC PRESENT, PREDOMINANTLY MONONUCLEAR FEW YEAST    Culture   Final    FEW CANDIDA ALBICANS NO ANAEROBES ISOLATED Performed at [facility], [place] [address] [place], [place] [address]    Report Status 01/01/2020 FINAL  Final  Aerobic/Anaerobic Culture (surgical/deep wound)     Status: None   Collection Time: 12/27/19  4:36 PM  Specimen: Abscess  Result Value Ref Range Status   Specimen Description   Final    ABSCESS RIGHT KIDNEY Performed at [place] [address], [place], [place] [address]    Special Requests   Final    NONE Performed at [facility], [place] [address] [place], [place] [address]    Gram Stain   Final    RARE WBC PRESENT,BOTH PMN AND MONONUCLEAR NO ORGANISMS SEEN    Culture   Final    FEW CANDIDA ALBICANS NO ANAEROBES ISOLATED Performed at [facility], [place] [address], [place], [place] [address]    Report Status 01/02/2020 FINAL  Final  Aerobic/Anaerobic Culture (surgical/deep wound)     Status: None   Collection Time: 12/31/19  1:06 PM   Specimen: Abscess  Result Value Ref Range Status   Specimen Description   Final    ABSCESS Performed at [place] [address] [place], [place]  [address]    Special Requests   Final    Normal Performed at [facility], [place] [address] [place], [place] [address]    Gram Stain   Final    MANY WBC PRESENT, PREDOMINANTLY PMN NO ORGANISMS SEEN    Culture   Final    FEW CANDIDA ALBICANS NO ANAEROBES ISOLATED Performed at [facility], [place] [address], [place], [place] [address]    Report Status 01/05/2020 FINAL  Final    Labs: Results for orders placed or performed during the hospital encounter of 12/24/19 (from the past 48 hour(s))  Renal function panel     Status: Abnormal   Collection Time: 01/06/20  5:07 AM  Result Value Ref Range   Sodium 141 135 - 145 mmol/L   Potassium 5.1 3.5 - 5.1 mmol/L   Chloride 110 98 - 111 mmol/L   CO2 24 22 - 32 mmol/L   Glucose, Bld 83 70 - 99 mg/dL    Comment: Glucose reference range applies only to samples taken after fasting for at least 8 hours.   BUN 52 (H) 6 - 20 mg/dL   Creatinine, Ser 6.19 (H) 0.61 - 1.24 mg/dL   Calcium 7.5 (L) 8.9 - 10.3 mg/dL   Phosphorus 4.6 2.5 - 4.6 mg/dL   Albumin 1.9 (L) 3.5 - 5.0 g/dL   GFR calc non Af Amer 9 (L) >60 mL/min   Anion gap 7 5 - 15    Comment: Performed at [facility], [place] [address], [place], [place] [address]  CBC with Differential/Platelet     Status: Abnormal   Collection Time: 01/06/20  5:07 AM  Result Value Ref Range   WBC 5.5 4.0 - 10.5 K/uL   RBC 2.79 (L) 4.22 - 5.81 MIL/uL   Hemoglobin 8.4 (L) 13.0 - 17.0 g/dL   HCT 27.8 (L) 39 - 52 %   MCV 99.6 80.0 - 100.0 fL   MCH 30.1 26.0 - 34.0 pg   MCHC 30.2 30.0 - 36.0 g/dL   RDW 16.5 (H) 11.5 - 15.5 %   Platelets 143 (L) 150 - 400 K/uL   nRBC 0.0 0.0 - 0.2 %   Neutrophils Relative % 48 %   Neutro Abs 2.7 1.7 - 7.7 K/uL   Lymphocytes Relative 31 %   Lymphs Abs 1.7 0.7 - 4.0 K/uL   Monocytes Relative 10 %   Monocytes Absolute 0.5 0.1 - 1.0 K/uL   Eosinophils Relative 9 %  Eosinophils Absolute 0.5 0 - 0 K/uL   Basophils Relative 1 %    Basophils Absolute 0.0 0 - 0 K/uL   Immature Granulocytes 1 %   Abs Immature Granulocytes 0.04 0.00 - 0.07 K/uL    Comment: Performed at [facility], [place] [address] [place], [place] [address]  Magnesium     Status: None   Collection Time: 01/06/20  5:07 AM  Result Value Ref Range   Magnesium 2.2 1.7 - 2.4 mg/dL    Comment: Performed at [facility], [place] [address], [place], [place] [address]  Renal function panel     Status: Abnormal   Collection Time: 01/07/20  4:49 AM  Result Value Ref Range   Sodium 142 135 - 145 mmol/L   Potassium 5.3 (H) 3.5 - 5.1 mmol/L   Chloride 109 98 - 111 mmol/L   CO2 26 22 - 32 mmol/L   Glucose, Bld 80 70 - 99 mg/dL    Comment: Glucose reference range applies only to samples taken after fasting for at least 8 hours.   BUN 50 (H) 6 - 20 mg/dL   Creatinine, Ser 5.99 (H) 0.61 - 1.24 mg/dL   Calcium 7.8 (L) 8.9 - 10.3 mg/dL   Phosphorus 4.8 (H) 2.5 - 4.6 mg/dL   Albumin 2.0 (L) 3.5 - 5.0 g/dL   GFR calc non Af Amer 10 (L) >60 mL/min   Anion gap 7 5 - 15    Comment: Performed at [facility], [place] [address], [place], [place] [address]  CBC with Differential/Platelet     Status: Abnormal   Collection Time: 01/07/20  4:49 AM  Result Value Ref Range   WBC 5.3 4.0 - 10.5 K/uL   RBC 2.90 (L) 4.22 - 5.81 MIL/uL   Hemoglobin 8.8 (L) 13.0 - 17.0 g/dL   HCT 28.8 (L) 39 - 52 %   MCV 99.3 80.0 - 100.0 fL   MCH 30.3 26.0 - 34.0 pg   MCHC 30.6 30.0 - 36.0 g/dL   RDW 16.5 (H) 11.5 - 15.5 %   Platelets 163 150 - 400 K/uL   nRBC 0.0 0.0 - 0.2 %   Neutrophils Relative % 50 %   Neutro Abs 2.7 1.7 - 7.7 K/uL   Lymphocytes Relative 32 %   Lymphs Abs 1.7 0.7 - 4.0 K/uL   Monocytes Relative 10 %   Monocytes Absolute 0.5 0.1 - 1.0 K/uL   Eosinophils Relative 7 %   Eosinophils Absolute 0.4 0 - 0 K/uL   Basophils Relative 1 %   Basophils Absolute 0.0 0 - 0 K/uL   Immature Granulocytes 0 %   Abs Immature  Granulocytes 0.02 0.00 - 0.07 K/uL    Comment: Performed at [facility], [place] [address], [place], [place] [address]  Magnesium     Status: None   Collection Time: 01/07/20  4:49 AM  Result Value Ref Range   Magnesium 2.0 1.7 - 2.4 mg/dL    Comment: Performed at [facility], [place] [address] [place], [place] [address]  Hepatic function panel     Status: Abnormal   Collection Time: 01/07/20  4:49 AM  Result Value Ref Range   Total Protein 5.7 (L) 6.5 - 8.1 g/dL   Albumin 2.0 (L) 3.5 - 5.0 g/dL   AST 37 15 - 41 U/L   ALT 19 0 - 44 U/L   Alkaline Phosphatase 86 38 - 126 U/L  Total Bilirubin 0.3 0.3 - 1.2 mg/dL   Bilirubin, Direct <0.1 0.0 - 0.2 mg/dL   Indirect Bilirubin NOT CALCULATED 0.3 - 0.9 mg/dL    Comment: Performed at [facility], [place] [address] [place], [place] [address]    Imaging / Studies: No results found.  Medications / Allergies: per chart  Antibiotics: Anti-infectives (From admission, onward)   Start     Dose/Rate Route Frequency Ordered Stop   01/01/20 1000  fluconazole (DIFLUCAN) tablet 200 mg        200 mg Oral Daily 12/31/19 1618 01/15/20 0959   12/31/19 1800  meropenem (MERREM) 500 mg in sodium chloride 0.9 % 100 mL IVPB        500 mg 200 mL/hr over 30 Minutes Intravenous Every 12 hours 12/31/19 1545 01/14/20 2159   12/27/19 1541  ciprofloxacin (CIPRO) 400 MG/200ML IVPB       Note to Pharmacy: Hilma Favors   : cabinet override      12/27/19 1541 12/28/19 0741   12/27/19 1333  fluconazole (DIFLUCAN) tablet 200 mg        200 mg Oral Daily 12/27/19 1333 12/31/19 0913   12/27/19 1300  fluconazole (DIFLUCAN) tablet 400 mg  Status:  Discontinued        400 mg Oral Daily 12/27/19 1213 12/27/19 1214   12/27/19 1300  fluconazole (DIFLUCAN) tablet 200 mg  Status:  Discontinued        200 mg Oral Daily 12/27/19 1214 12/27/19 1333   12/25/19 1600  piperacillin-tazobactam (ZOSYN) IVPB 2.25 g  Status:   Discontinued        2.25 g 100 mL/hr over 30 Minutes Intravenous Every 8 hours 12/25/19 1226 12/27/19 1213   12/25/19 1132  ceFAZolin (ANCEF) 2-4 GM/100ML-% IVPB       Note to Pharmacy: [place]  : cabinet override      12/25/19 1132 12/25/19 2344        Note: Portions of this report may have been transcribed using voice recognition software. Every effort was made to ensure accuracy; however, inadvertent computerized transcription errors may be present.   Any transcriptional errors that result from this process are unintentional.    Adin Hector, MD, FACS, MASCRS Gastrointestinal and Minimally Invasive Surgery  [facility] [address] [place], [place] [address] [phone] Fax [phone] Main/Paging  CONTACT INFORMATION: Weekday (9AM-5PM) concerns: Call [facility] main office at [phone] Weeknight (5PM-9AM) or Weekend/Holiday concerns: Check [email or web address] for General Surgery [facility] coverage (Please, do not use SecureChat as it is not reliable communication to operating surgeons for immediate patient care)      01/07/2020  8:03 AM

## 2020-01-07 NOTE — Progress Notes (Addendum)
Chase [name] Progress Note  Subjective: creat down 5.9, no c/o's  Review of systems:     Denies shortness of breath  No chest pain  No n/v  Vitals:   01/06/20 1239 01/06/20 1752 01/06/20 2139 01/07/20 0553  BP: (!) 152/76  (!) 141/74 (!) 153/72  Pulse: (!) 51  (!) 50 (!) 48  Resp:   18 20  Temp:   98.5 F (36.9 C) 99.2 F (37.3 C)  TempSrc:   Oral Oral  SpO2: 100%  96% 97%  Weight:  63.1 kg    Height:        Exam: Gen adult male in bed in NAD; chronically ill appearing    HENT NCAT  Chest clear bilaterally unlabored on room air S1S2 no rub Abd soft ntnd , pelvic drain in place GU foley cath in place; neph tubes in place bilat Ext no lower extremity edema appreciated Neuro is alert, Ox 3; provides hx and follows commands Psych normal mood and affect    Home meds:  - cymbalta 30-60 hs/ neurontin 800 hs/ norco qid prn  - midodrine 2.5- 5 tid prn  - flomax 0.4 qd  - ensure tid bm/ MVI/ atarax prn      CT renal stone study noncon >  IMPRESSION: 1. Reaccumulation of small to moderate volume of fluid above the urinary bladder in this patient with suspected defect in the urinary bladder. No adjacent colonic thickening. Study limited by lack of rectal or vesicle contrast with respect to detection of colovesical fistula though the fat plane appears reasonably well preserved between the collection in the colon on the current study. 2. Interval development of moderate to marked bilateral hydroureteronephrosis despite presence of bilateral ureteral stents. 3. Aortic atherosclerosis.    VS BP 114/ 66, HR 70  RR 18  RA 100%  Temp 98   Na 131 K 5.5  CO2 7  BUN 81  Cr 7.19  Ca 8.0  Hb 8.9  WBC 7k  plt 220   COVID negative   UA many bact >50 rbc/ wbc, 100 prot, turbid     Assessment/ Plan 1. AoCKD 4 - b/l creat 2.4- 2.8, eGFR 24- 8m/min. AKI w/ assoc severe bilat hydronephrosis, hx prior ureteral stents. Urology did procedure 9/25 w/ L ureteral stent replaced  but could not get a new R ureteral stent in position, +tortuous ureters. Had bilateral neph tubes per IR on 9/27.  Creat improved from peak 8.4 >> 7.7 > 6.9 > 6.5 > 6.2 > 6.4 > 5.7 > 5.94 > 6.1> 5.9 today.  Good UOP, euvolemic and not uremic. No indication for RRT.  [place]. We will arrange f/u in the office. OK to taper IVF's off. Will sign off.  2. Bradycardia - resolved, blocking agent dc'd and high K+ resolved 3. Met acidosis - improved; on oral bicarb and LR 4. Hypokalemia - mag is replete; Ca corrects with adjustment for albumin; on LR 5. Hematuria/ bilat hydronephrosis/ hx ureteral stents - tortuous ureters per the OP note by urology. Urology recommends IR to internalize a right ureteral stent at some point in preparation for repair of colovesical fistula to identify ureters intraoperatively.  6. Pelvic abscess between bladder and colon - Had IR drain placement to pelvic abscess on 10/1. abx per primary   7. H/o CVA - left hemiparesis, had been at a SNF  8. Hypocalcemia - s/p repletion IV.  Improves with corr for albumin.  intact PTH ok at 48 9. hyperphos - renvela with meals for now. Advanced from renal to regular diet; he's on nepro as well as ensure/enlive 10. Normocytic anemia - iron deficient. Repleted with feraheme on 9/29.  S/p aranesp 40 mcg on 9/30 (one-time-order).  Transfuse PRBC's on 10/3 1 unit    Kelly Splinter, MD 01/07/2020, 2:51 PM     Recent Labs  Lab 01/06/20 0507 01/07/20 0449  K 5.1 5.3*  BUN 52* 50*  CREATININE 6.19* 5.99*  CALCIUM 7.5* 7.8*  PHOS 4.6 4.8*  HGB 8.4* 8.8*   Inpatient medications: . sodium chloride   Intravenous Once  . bisacodyl  10 mg Rectal Daily  . Chlorhexidine Gluconate Cloth  6 each Topical Daily  . feeding supplement  1 Container Oral TID BM  . fluconazole  200 mg Oral Daily  . gabapentin  200 mg Oral Daily  . multivitamin with minerals  1 tablet Oral Daily  . nicotine  14 mg Transdermal Daily  .  sevelamer carbonate  800 mg Oral TID WC  . sodium bicarbonate  650 mg Oral BID  . sodium chloride flush  5 mL Intracatheter Q8H  . sodium chloride flush  5 mL Intracatheter Q8H  . tamsulosin  0.4 mg Oral Daily   . sodium chloride 75 mL/hr at 01/07/20 0633  . meropenem (MERREM) IV 500 mg (01/07/20 0900)   acetaminophen, HYDROcodone-acetaminophen, hydrOXYzine, ondansetron, polyethylene glycol

## 2020-01-07 NOTE — Progress Notes (Signed)
PROGRESS NOTE    Chase [name]  CBS:[id number] DOB: [birth year] DOA: 12/24/2019 PCP: [facility], [name]   Chief Complaint  Patient presents with  . Hematuria    Brief Narrative:  Chase [name] is Chase [name] 54 year old male with PMH of s/p cystoscopy with cystogram, bilateral retrograde pyelogram, likely colovesical fistula, left ureteral stent 11/15/2019, right ureteral stent was unable to be placed due to ureteral tortuosity, underwent right PC nephrostomy tube followed by internalization of right-sided stent, indwelling Foley catheter, CKD, GI bleed, chronic pain, gastric bypass, presented from SNF due to hematuria, dysuria, nausea, poor appetite and fatigue.   CT abdomen on admission showed severe bilateral hydronephrosis and possible colovesical fistula. On admission creatinine 7.1 (baseline 2.4-2.8).  Admitted for acute on chronic kidney disease, severe anion gap metabolic acidosis, hyperkalemia, bilateral hydronephrosis. Nephrology and urology consulting. 9/25, s/p left ureteral stent replaced but right ureteral stent unsuccessful. Bilateral percutaneous nephrostomies placed by IR 12/27/19.  His creatinine peaked at 8.47 and started to show some signs of downtrending. Urine output remained adequate consistent with ATN/diuretic phase. He underwent repeat CT abdomen/pelvis on 12/30/2019 which revealed "Perivesical fluid collection is again noted, which currently measures 6.0 x 4.3 cm in the transverse orientation which may be slightly enlarged compared to prior exam".  IR was consulted for possible drain placement into fluid collection.  He underwent successful drain placement on 12/31/2019 and cultures were sent for testing. Infectious disease was also consulted and patient was started on meropenem and continued with fluconazole while cultures further matured. He was also evaluated by GI and tentative plans are for colonoscopy the day prior to fistula repair if able to be coordinated in  that manner.  Assessment & Plan:   Principal Problem:   Acute renal failure superimposed on stage 4 chronic kidney disease (HCC) Active Problems:   CKD (chronic kidney disease), stage IV (HCC)   Chronic pain   Cerebral embolism with cerebral infarction   Protein-calorie malnutrition, severe   BPH (benign prostatic hyperplasia)   Colovesical fistula   Pelvic abscess with colovescial fistula   Anemia   History of COVID-19   History of gastric bypass   Iron deficiency anemia   Pernicious anemia   PTSD (post-traumatic stress disorder)   AKI (acute kidney injury) ([place])   Bilateral ureteral obstruction s/p perc nephrosotmy & stenting   ATN (acute tubular necrosis) (HCC)   Candida UTI   Electrolyte abnormality   Increased anion gap metabolic acidosis   Microcytic anemia   ABLA (acute blood loss anemia)   Anxiety and depression   History of CVA (cerebrovascular accident)   BMI 24.0-24.9, adult   Chronic narcotic use   Anemia in chronic kidney disease   Incisional hernia - epigastric  Acute renal failure superimposed on stage 4 chronic kidney disease (HCC) Nephrologyfollowing, appreciate assistance. Baseline creatinine 2.4-2.8. Presented with creatinine of 7.1 and peaked at 8.47. CT abdomen showed bilateral severe hydronephrosis. Foley catheter in place. - s/p left ureteral stent replacement on 9/25 (right ureteral stent unsuccessful) - per urology, would recommend IR to internalize Chase [name] ureteral stent at some point in preparation for repair of colovesical fistula to identify ureters intraoperatively - s/p bilateral perc nephrostomy tubes by IR 9/27 - tubes to remain in place until colovesicular fistula repair completed -Appears to be in his post obstructive diuretic phase -renal following, appreciate assistance -appreciate urology recommendations - renal function relatively stable today, 5.99, ~3.7 L out yesterday, continue IVF.  Per renal, ok to discharge to  SNF when ready.   Needs to follow up with renal outpatient (neprhology will arrange).    Pelvic abscess with colovescial fistula - greatly appreciate multi-modality input (surgery, nephrology, urology, IR, ID, GI) - definitive repair still being determined; per surgery note 10/5, plan to return to rehab when stable, will need follow up with [name] for surgical planning - per 10/8 note, recommending waiting 6 weeks after resolution of abscess -> planning for robotic resection in November - s/p IR pelvic abscess drain on 10/1 (culture growing "few candida albicans"); ID consulted; patient now on meropenem and fluconazole (C. Albicans growing in L/[name] nephrostomy tube cultures and as noted now the deep pelvic culture is growing candida albicans from 10/1).  Plan for 14 days fluconazole + meropenem.  Will complete meropenem and fluconazole, then continue fluconazole until surgery per my discussion with ID today.  Can't go to SNF with tunneled line, will plan to d/c with PIV. - per IR flush pelvic drain with 5 cc sterile NS once daily; record output of all drains; change dressing over drains every 1-2 days; drains will need to remain in place until surgery - if he does spike fever or significant leukocytosis, needs blood cultures - evaluated by GI on 10/2; tentative plan is for colonoscopy the day prior to fistula repair; timing of this is still to be determined   Bilateral ureteral obstruction s/p perc nephrosotmy & stenting -Bilateral nephrostomy tubes to remain in place per urology until definitive repair of colovesical fistula -Also recommendations per urology are for IR to internalize right ureteral stent in preparation for fistula repair in order to identify ureters intraoperatively -Indwelling Foley catheter to remain in place with catheter exchange every 4 to 6 weeks until definitive surgery for fistula and bladder repair  History of CVA (cerebrovascular accident) -CVA on 10/18/2019 MRI. Small bilateral acute  cerebral and cerebellar infarcts. Residual left hemiparesis  Electrolyte abnormality Hyperkalemia, hypokalemia, hypocalcemia, hyperphosphatemia -Treating appropriately as indicated per each - iPTH = 50 - continue sevelamer - IVF changed to LR per nephrology   Candida UTI - recent urological interventions, dysuria although he is afebrile and without leukocytosis. Previous urine cultures had shown E. coli and Pseudomonas. Patient was started on IV Zosyn. However urine culture then showed 70 K colonies of yeast, speciated to C. Albicans - remains on fluconazole; ID now formally consulted, continue to follow recommendations - see ID note from 10/4; plan is to complete 14 day course of Fluconazole and meropenem; end date 01/14/20;plan to continue fluconazole until surgery  BMI 24.0-24.9, adult - continue supplements and MVI  Anxiety and depression - resume cymbalta if renal function returns near baseline  ABLA (acute blood loss anemia) - see microcytic anemia  Microcytic anemia - has required 2 units PRBC so far; Hgb still slowly downtrending - continue to monitor and will transfuse further as needed  Increased anion gap metabolic acidosis Bicarbonate drip discontinued and started oral bicarbonate 9/27. - also on LR  ATN (acute tubular necrosis) ([place]) - see AoCKDIV  AKI (acute kidney injury) ([place]) - see AoCKDIV   DVT prophylaxis: SCD Code Status: full  Family Communication: none at bedside Disposition:   Status is: Inpatient  Remains inpatient appropriate because:Inpatient level of care appropriate due to severity of illness   Dispo: The patient is from: Home              Anticipated d/c is to: SNF  Anticipated d/c date is: > 3 days              Patient currently is not medically stable to d/c.       Consultants:   Urology, Nephrology, GI, IR, ID, surgery  Procedures:  See previous notes and above  Antimicrobials:  Anti-infectives  (From admission, onward)   Start     Dose/Rate Route Frequency Ordered Stop   01/01/20 1000  fluconazole (DIFLUCAN) tablet 200 mg        200 mg Oral Daily 12/31/19 1618 01/15/20 0959   12/31/19 1800  meropenem (MERREM) 500 mg in sodium chloride 0.9 % 100 mL IVPB        500 mg 200 mL/hr over 30 Minutes Intravenous Every 12 hours 12/31/19 1545 01/14/20 2159   12/27/19 1541  ciprofloxacin (CIPRO) 400 MG/200ML IVPB       Note to Pharmacy: Hilma Favors   : cabinet override      12/27/19 1541 12/28/19 0741   12/27/19 1333  fluconazole (DIFLUCAN) tablet 200 mg        200 mg Oral Daily 12/27/19 1333 12/31/19 0913   12/27/19 1300  fluconazole (DIFLUCAN) tablet 400 mg  Status:  Discontinued        400 mg Oral Daily 12/27/19 1213 12/27/19 1214   12/27/19 1300  fluconazole (DIFLUCAN) tablet 200 mg  Status:  Discontinued        200 mg Oral Daily 12/27/19 1214 12/27/19 1333   12/25/19 1600  piperacillin-tazobactam (ZOSYN) IVPB 2.25 g  Status:  Discontinued        2.25 g 100 mL/hr over 30 Minutes Intravenous Every 8 hours 12/25/19 1226 12/27/19 1213   12/25/19 1132  ceFAZolin (ANCEF) 2-4 GM/100ML-% IVPB       Note to Pharmacy: [place]  : cabinet override      12/25/19 1132 12/25/19 2344     Subjective: Eager for return to [facility] when possible  Objective: Vitals:   01/06/20 1239 01/06/20 1752 01/06/20 2139 01/07/20 0553  BP: (!) 152/76  (!) 141/74 (!) 153/72  Pulse: (!) 51  (!) 50 (!) 48  Resp:   18 20  Temp:   98.5 F (36.9 C) 99.2 F (37.3 C)  TempSrc:   Oral Oral  SpO2: 100%  96% 97%  Weight:  63.1 kg    Height:        Intake/Output Summary (Last 24 hours) at 01/07/2020 1539 Last data filed at 01/07/2020 1419 Gross per 24 hour  Intake 1761.66 ml  Output 4270 ml  Net -2508.34 ml   Filed Weights   12/31/19 0500 01/06/20 0500 01/06/20 1752  Weight: 61.8 kg 65.5 kg 63.1 kg    Examination:  General: No acute distress. Cardiovascular: Heart sounds show Keionna Kinnaird regular  rate, and rhythm Lungs: Clear to auscultation bilaterally Abdomen: Soft, nontender, nondistended  Neurological: Alert and oriented 3. Moves all extremities 4. Cranial nerves II through XII grossly intact. Skin: Warm and dry. No rashes or lesions. Extremities: No clubbing or cyanosis. No edema  Data Reviewed: I have personally reviewed following labs and imaging studies  CBC: Recent Labs  Lab 01/03/20 0506 01/04/20 0453 01/05/20 0558 01/06/20 0507 01/07/20 0449  WBC 8.6 8.0 5.2 5.5 5.3  NEUTROABS 5.8 5.8 3.2 2.7 2.7  HGB 8.9* 8.8* 9.0* 8.4* 8.8*  HCT 27.8* 27.9* 29.2* 27.8* 28.8*  MCV 95.5 96.2 98.6 99.6 99.3  PLT 164 161 143* 143* 161    Basic Metabolic Panel: Recent Labs  Lab 01/03/20 0506 01/04/20 0453 01/05/20 0558 01/06/20 0507 01/07/20 0449  NA 143 143 142 141 142  K 3.9 3.9 4.3 5.1 5.3*  CL 112* 112* 108 110 109  CO2 22 23 26 24 26   GLUCOSE 81 77 140* 83 80  BUN 53* 47* 53* 52* 50*  CREATININE 6.42* 5.74* 5.94* 6.19* 5.99*  CALCIUM 7.5* 7.4* 7.5* 7.5* 7.8*  MG 2.0 2.0 2.1 2.2 2.0  PHOS 5.6* 4.4 4.2 4.6 4.8*    GFR: Estimated Creatinine Clearance: 11.1 mL/min (Cortlandt Capuano) (by C-G formula based on SCr of 5.99 mg/dL (H)).  Liver Function Tests: Recent Labs  Lab 01/03/20 0506 01/04/20 0453 01/05/20 0558 01/06/20 0507 01/07/20 0449  AST  --   --   --   --  37  ALT  --   --   --   --  19  ALKPHOS  --   --   --   --  86  BILITOT  --   --   --   --  0.3  PROT  --   --   --   --  5.7*  ALBUMIN 1.9* 1.9* 2.0* 1.9* 2.0*  2.0*    CBG: No results for input(s): GLUCAP in the last 168 hours.   Recent Results (from the past 240 hour(s))  Aerobic/Anaerobic Culture (surgical/deep wound)     Status: None   Collection Time: 12/31/19  1:06 PM   Specimen: Abscess  Result Value Ref Range Status   Specimen Description   Final    ABSCESS Performed at [facility] [address] [place], [place] [address]    Special Requests   Final     Normal Performed at [facility], [place] [address] [place], [place] [address]    Gram Stain   Final    MANY WBC PRESENT, PREDOMINANTLY PMN NO ORGANISMS SEEN    Culture   Final    FEW CANDIDA ALBICANS NO ANAEROBES ISOLATED Performed at [facility], [place] [address] [place], [place] [address]    Report Status 01/05/2020 FINAL  Final         Radiology Studies: No results found.      Scheduled Meds: . sodium chloride   Intravenous Once  . bisacodyl  10 mg Rectal Daily  . Chlorhexidine Gluconate Cloth  6 each Topical Daily  . feeding supplement  1 Container Oral TID BM  . fluconazole  200 mg Oral Daily  . gabapentin  200 mg Oral Daily  . multivitamin with minerals  1 tablet Oral Daily  . nicotine  14 mg Transdermal Daily  . sevelamer carbonate  800 mg Oral TID WC  . sodium bicarbonate  650 mg Oral BID  . sodium chloride flush  5 mL Intracatheter Q8H  . sodium chloride flush  5 mL Intracatheter Q8H  . sodium zirconium cyclosilicate  10 g Oral Once  . tamsulosin  0.4 mg Oral Daily   Continuous Infusions: . sodium chloride 75 mL/hr at 01/07/20 0633  . meropenem (MERREM) IV 500 mg (01/07/20 0900)     LOS: 14 days    Time spent: over 30 min    Fayrene Helper, MD [facility]   To contact the attending provider between 7A-7P or the covering provider during after hours 7P-7A, please log into the web site [email or web address] and access using universal [place] password for that web site. If you do not have the password, please call the hospital operator.  01/07/2020, 3:39 PM

## 2020-01-07 NOTE — Progress Notes (Signed)
Patient brought red mark on right arm to nurse's attention. Red area outlined with a sharpie. Will continue to monitor.

## 2020-01-08 LAB — COMPREHENSIVE METABOLIC PANEL
ALT: 17 U/L (ref 0–44)
AST: 26 U/L (ref 15–41)
Albumin: 2 g/dL — ABNORMAL LOW (ref 3.5–5.0)
Alkaline Phosphatase: 81 U/L (ref 38–126)
Anion gap: 9 (ref 5–15)
BUN: 50 mg/dL — ABNORMAL HIGH (ref 6–20)
CO2: 26 mmol/L (ref 22–32)
Calcium: 8 mg/dL — ABNORMAL LOW (ref 8.9–10.3)
Chloride: 107 mmol/L (ref 98–111)
Creatinine, Ser: 5.62 mg/dL — ABNORMAL HIGH (ref 0.61–1.24)
GFR, Estimated: 10 mL/min — ABNORMAL LOW (ref >60)
Glucose, Bld: 93 mg/dL (ref 70–99)
Potassium: 4.9 mmol/L (ref 3.5–5.1)
Sodium: 142 mmol/L (ref 135–145)
Total Bilirubin: 0.7 mg/dL (ref 0.3–1.2)
Total Protein: 5.6 g/dL — ABNORMAL LOW (ref 6.5–8.1)

## 2020-01-08 LAB — MAGNESIUM: Magnesium: 2.2 mg/dL (ref 1.7–2.4)

## 2020-01-08 LAB — CBC WITH DIFFERENTIAL/PLATELET
Abs Immature Granulocytes: 0.03 10*3/uL (ref 0.00–0.07)
Basophils Absolute: 0 10*3/uL (ref 0.0–0.1)
Basophils Relative: 1 %
Eosinophils Absolute: 0.4 10*3/uL (ref 0.0–0.5)
Eosinophils Relative: 7 %
HCT: 28.1 % — ABNORMAL LOW (ref 39.0–52.0)
Hemoglobin: 8.5 g/dL — ABNORMAL LOW (ref 13.0–17.0)
Immature Granulocytes: 1 %
Lymphocytes Relative: 24 %
Lymphs Abs: 1.4 10*3/uL (ref 0.7–4.0)
MCH: 30.2 pg (ref 26.0–34.0)
MCHC: 30.2 g/dL (ref 30.0–36.0)
MCV: 100 fL (ref 80.0–100.0)
Monocytes Absolute: 0.4 10*3/uL (ref 0.1–1.0)
Monocytes Relative: 7 %
Neutro Abs: 3.6 10*3/uL (ref 1.7–7.7)
Neutrophils Relative %: 60 %
Platelets: 190 10*3/uL (ref 150–400)
RBC: 2.81 MIL/uL — ABNORMAL LOW (ref 4.22–5.81)
RDW: 16.2 % — ABNORMAL HIGH (ref 11.5–15.5)
WBC: 5.9 10*3/uL (ref 4.0–10.5)
nRBC: 0 % (ref 0.0–0.2)

## 2020-01-08 LAB — PHOSPHORUS: Phosphorus: 4.6 mg/dL (ref 2.5–4.6)

## 2020-01-08 MED ORDER — SEVELAMER CARBONATE 800 MG PO TABS: 800.0000 mg | ORAL_TABLET | Freq: Three times a day (TID) | ORAL | 0 refills | Status: AC

## 2020-01-08 MED ORDER — SODIUM BICARBONATE 650 MG PO TABS: 650.0000 mg | ORAL_TABLET | Freq: Two times a day (BID) | ORAL | 0 refills | Status: AC

## 2020-01-08 MED ORDER — FLUCONAZOLE 100 MG PO TABS: 200.0000 mg | ORAL_TABLET | Freq: Every day | ORAL | 0 refills | Status: AC

## 2020-01-08 MED ORDER — GABAPENTIN 100 MG PO CAPS: 200.0000 mg | ORAL_CAPSULE | Freq: Every day | ORAL | 0 refills | Status: DC

## 2020-01-08 MED ORDER — FLUCONAZOLE 100 MG PO TABS: 100.0000 mg | ORAL_TABLET | Freq: Every day | ORAL | 1 refills | Status: DC

## 2020-01-08 MED ORDER — MEROPENEM IV (FOR PTA / DISCHARGE USE ONLY): 500.0000 mg | Freq: Two times a day (BID) | INTRAVENOUS | 0 refills | Status: AC

## 2020-01-08 NOTE — Progress Notes (Signed)
Attempted to call report at [facility] and was transferred to a VM of charge nurse. Left a VM to call back for report.

## 2020-01-08 NOTE — Discharge Summary (Signed)
Physician Discharge Summary  Chase [name] Sr NVB:[id number] DOB: 26-Nov-1965 DOA: 12/24/2019  PCP: [name]  Admit date: 12/24/2019 Discharge date: 01/08/2020  Time spent: 50 minutes  Recommendations for Outpatient Follow-up:  1. Follow outpatient CBC/CMP 2. Continue meropenem through peripheral IV per SNF protocol.  Meropenem will be completed on 10/15. 3. Continue fluconazole 200 mg daily until 10/15, after this continue 100 mg fluconazole daily until time of surgery.   4. Needs follow up with IR to internalize the R ureteral stent  5. Needs follow up with urology for foley catheter exchange 6. Needs follow up with surgery outpatient for surgical planning 7. Needs colonoscopy prior to surgery 8. Needs follow up with renal outpatient to follow renal function  9. Continue to adjust meds based on renal function  10. Continue drains until surgery.  Flush pelvic drain daily with 5 cc sterile normal saline.  Record output on all drains.  Change dressing every 1-2 days. 11. Foley should be exchanged every 4-6 weeks 12. Follow pressure ulcer and wound care outpatient   Discharge Diagnoses:  Principal Problem:   Acute renal failure superimposed on stage 4 chronic kidney disease (HCC) Active Problems:   CKD (chronic kidney disease), stage IV (HCC)   Chronic pain   Cerebral embolism with cerebral infarction   Protein-calorie malnutrition, severe   BPH (benign prostatic hyperplasia)   Colovesical fistula   Pelvic abscess with colovescial fistula   Anemia   History of COVID-19   History of gastric bypass   Iron deficiency anemia   Pernicious anemia   PTSD (post-traumatic stress disorder)   AKI (acute kidney injury) ([place])   Bilateral ureteral obstruction s/p perc nephrosotmy & stenting   ATN (acute tubular necrosis) (HCC)   Candida UTI   Electrolyte abnormality   Increased anion gap metabolic acidosis   Microcytic anemia   ABLA (acute blood loss anemia)   Anxiety and  depression   History of CVA (cerebrovascular accident)   BMI 24.0-24.9, adult   Chronic narcotic use   Anemia in chronic kidney disease   Incisional hernia - epigastric   Discharge Condition: stable  Diet recommendation: heart healthy  Filed Weights   12/31/19 0500 01/06/20 0500 01/06/20 1752  Weight: 61.8 kg 65.5 kg 63.1 kg   History of present illness:  Chase [name] is [name] 54 year old male with PMH of s/p cystoscopy with cystogram, bilateral retrograde pyelogram, likely colovesical fistula, left ureteral stent 11/15/2019, right ureteral stent was unable to be placed due to ureteral tortuosity, underwent right PC nephrostomy tube followed by internalization of right-sided stent, indwelling Foley catheter, CKD, GI bleed, chronic pain, gastric bypass, presented from SNF due to hematuria, dysuria, nausea, poor appetite and fatigue.   His CT abdomen on admission showed severe bilateral hydronephrosis and possible colovesical fistula. On admission creatinine 7.1 (baseline 2.4-2.8).  Admitted for acute on chronic kidney disease, severe anion gap metabolic acidosis, hyperkalemia, bilateral hydronephrosis. Nephrology and urology consulting. 9/25, s/p left ureteral stent replaced but right ureteral stent unsuccessful. Bilateral percutaneous nephrostomies placed by IR 12/27/19.  His creatinine peaked at 8.47 and started to show some signs of downtrending. Urine output remained adequate consistent with ATN/diuretic phase. He underwent repeat CT abdomen/pelvis on 12/30/2019 which revealed "Perivesical fluid collection is again noted, which currently measures 6.0 x 4.3 cm in the transverse orientation which may be slightly enlarged compared to prior exam".  IR was consulted for possible drain placement into fluid collection. He underwent successful drain placement on 12/31/2019 and  cultures were sent for testing. Infectious disease was also consulted and patient was started on meropenem and continued  with fluconazole.  His cultures have grown candida albicans.  He was also evaluated by GI and tentative plans are for colonoscopy the day prior to fistula repair if able to be coordinated in that manner. His renal function gradually has improved, but seems to have slowed in terms of improvement.  At this time, per surgery, plan for rectosigmoid colon resection and bladder repair of colovesical fistula and would recommend waiting 6 weeks after resolution of abscess.  Planning robotic resection in November.  Drains should stay in place until surgery.  He's currently stable for discharge at this point with need for follow up outpatient.  See below for additional details Hospital Course:  Acute renal failure superimposed on stage 4 chronic kidney disease (HCC) Nephrologyfollowing, appreciate assistance. Baseline creatinine 2.4-2.8. Presented with creatinine of 7.1 and peaked at 8.47. CT abdomen showed bilateral severe hydronephrosis. Foley catheter in place. - s/p left ureteral stent replacement on 9/25 (right ureteral stent unsuccessful) - per urology, would recommend IR to internalize [name] R ureteral stent at some point in preparation for repair of colovesical fistula to identify ureters intraoperatively - s/p bilateral perc nephrostomy tubes by IR 9/27 - tubes to remain in place until colovesicular fistula repair completed -Appears to be in his post obstructive diuretic phase -renal following, appreciate assistance -appreciate urology recommendations - renal function relatively stable to improving today, ~5.6.  Per renal, ok to discharge to SNF when ready.  Needs to follow up with renal outpatient (neprhology will arrange).    Pelvic abscess with colovescial fistula -greatly appreciate multi-modality input (surgery, nephrology, urology, IR, ID, GI) - definitive repair still being determined; per surgery note 10/5, plan to return to rehab when stable, will need follow up with [name] for  surgical planning - per 10/8 note, recommending waiting 6 weeks after resolution of abscess -> planning for robotic resection in November - s/p IR pelvic abscess drain on 10/1(culture growing "few candida albicans"); ID consulted; patient now on meropenem and fluconazole(C. Albicans growing in L/R nephrostomy tube culturesand as noted now the deep pelvic culture is growing candida albicans from 10/1).  Plan for 14 days fluconazole + meropenem.  Will complete meropenem and fluconazole, then continue fluconazole until surgery per my discussion with ID.  Can't go to SNF with tunneled line, will plan to d/c with PIV to complete course of meropenem through PIV. - per IR flush pelvic drain with 5 cc sterile NS once daily; record output of all drains; change dressing over drains every 1-2 days; drains will need to remain in place until surgery - if he does spike fever or significant leukocytosis, needs blood cultures -evaluated by GI on 10/2; tentative plan is for colonoscopy the day prior to fistula repair; timing of this is still to be determined   Bilateral ureteral obstruction s/p perc nephrosotmy & stenting -Bilateral nephrostomy tubes to remain in place per urology until definitive repair of colovesical fistula -Also recommendations per urology are for IR to internalize right ureteral stent in preparation for fistula repair in order to identify ureters intraoperatively -Indwelling Foley catheter to remain in place with catheter exchange every 4 to 6 weeks until definitive surgery for fistula and bladder repair  History of CVA (cerebrovascular accident) -CVA on 10/18/2019 MRI. Small bilateral acute cerebral and cerebellar infarcts. Residual left hemiparesis  Electrolyte abnormality Hyperkalemia, hypokalemia, hypocalcemia, hyperphosphatemia -Treating appropriately as indicated per each -  iPTH = 50 - continue sevelamer  Candida UTI -recent urological interventions, dysuria although he is  afebrile and without leukocytosis. Previous urine cultures had shown E. coli and Pseudomonas. Patient was started on IV Zosyn. However urine culturethen showed70 K colonies of yeast, speciated to C. Albicans -remains on fluconazole; ID now formally consulted, continue to follow recommendations as above - see ID note from 10/4; plan is to complete 14 day course of Fluconazole and meropenem; end date 01/14/20;plan to continue fluconazole until surgery  BMI 24.0-24.9, adult - continue supplements and MVI  Anxiety and depression - resume cymbalta if renal function returns near baseline - follow outpatient   ABLA (acute blood loss anemia) - see microcytic anemia  Microcytic anemia  iron Def anemia - has required 2 units PRBC so far; Hgb still slowly downtrending - continue to monitor and will transfuse further as needed - follow outpatient   Increased anion gap metabolic acidosis Bicarbonate drip discontinued and started oral bicarbonate 9/27. - also on LR  ATN (acute tubular necrosis) (HCC) - see AoCKDIV  AKI (acute kidney injury) ([place]) - see AoCKDIV  Pressure Injury 11/08/19 Coccyx Upper Stage 2 -  Partial thickness loss of dermis presenting as Damont Balles shallow open injury with Tema Alire red, pink wound bed without slough. unblanchable with an open slit in the middle non draining  (Active)  11/08/19 0300  Location: Coccyx  Location Orientation: Upper  Staging: Stage 2 -  Partial thickness loss of dermis presenting as Madeleine Fenn shallow open injury with Anakin Varkey red, pink wound bed without slough.  Wound Description (Comments): unblanchable with an open slit in the middle non draining   Present on Admission: Yes   Procedures: 10/1 CT image guided drainage IMPRESSION: Successful placement of Shiven Junious left transgluteal approach 12 French drainage catheter into the retro vesicular fluid collection. Aspiration yields thick, foul-smelling purulent fluid.  PLAN: 1. Maintain drain to JP bulb suction. 2.  Flush drainage catheter daily. 3. Drainage catheter will likely remain in place until the time of definitive surgery.  9/27 bilateral nephrostomy tube placement  9/25  1.  Cystoscopy with bilateral retrograde pyelogram, left ureteral stent placement, attempted but failed right ureteral stent placement  Consultations:  Urology, Nephrology, GI, IR, ID, surgery  Discharge Exam: Vitals:   01/07/20 2140 01/08/20 0523  BP: 140/78 129/67  Pulse: (!) 54 70  Resp: 16 17  Temp: 98.4 F (36.9 C) 98.6 F (37 C)  SpO2: 98% 95%   Eager to d/c to [facility] No complaints today  General: No acute distress. Cardiovascular: Heart sounds show Ivis Henneman regular rate, and rhythm.  Lungs: Clear to auscultation bilaterally Abdomen: Soft, nontender, nondistended.  Bilateral nephrostomy tubes, pelvic drain. Neurological: Alert and oriented 3. Moves all extremities 4 . Cranial nerves II through XII grossly intact. Skin: Warm and dry. No rashes or lesions. Extremities: No clubbing or cyanosis. No edema.  Discharge Instructions   Discharge Instructions    [facility] pharmacist to adjust dose for Vancomycin, Aminoglycosides and other anti-infective therapies as requested by physician.   Complete by: As directed    [facility] to provide Cath Flo 63m   Complete by: As directed    Administer for PICC line occlusion and as ordered by physician for other access device issues.   Anaphylaxis Kit: Provided to treat any anaphylactic reaction to the medication being provided to the patient if First Dose or when requested by physician   Complete by: As directed    Epinephrine 15mml vial /  amp: Administer 0.37m (0.328m subcutaneously once for moderate to severe anaphylaxis, nurse to call physician and pharmacy when reaction occurs and call 911 if needed for immediate care   Diphenhydramine 5038ml IV vial: Administer 25-17m8m/IM PRN for first dose reaction, rash, itching, mild reaction,  nurse to call physician and pharmacy when reaction occurs   Sodium Chloride 0.9% NS 500ml61m Administer if needed for hypovolemic blood pressure drop or as ordered by physician after call to physician with anaphylactic reaction   Call MD for:  difficulty breathing, headache or visual disturbances   Complete by: As directed    Call MD for:  extreme fatigue   Complete by: As directed    Call MD for:  hives   Complete by: As directed    Call MD for:  persistant dizziness or light-headedness   Complete by: As directed    Call MD for:  persistant nausea and vomiting   Complete by: As directed    Call MD for:  redness, tenderness, or signs of infection (pain, swelling, redness, odor or green/yellow discharge around incision site)   Complete by: As directed    Call MD for:  severe uncontrolled pain   Complete by: As directed    Call MD for:  temperature >100.4   Complete by: As directed    Change dressing on IV access line weekly and PRN   Complete by: As directed    Diet - low sodium heart healthy   Complete by: As directed    Discharge instructions   Complete by: As directed    You were seen for acute kidney injury, pelvic  abscess, colovesical fistula.  You had your left ureteral stent replaced, the right was unsuccessful.  You've had bilateral percutaneous nephrostomy tubes placed by IR.  You had Oriel Ojo drain placed for the pelvic abscess.  You'll continue antibiotics and antifungals until 10/15.  On 10/15 your antibiotics will be complete and your antifungals will be continued until November.    You have several follow up appointments that are needed.  You'll need to follow up with urology outpatient for foley catheter exchange.  You need to follow up with interventional radiology to internalize the right ureteral stent.  You'll follow up with surgery outpatient.  You'll have Fredricka Kohrs colonoscopy before your surgery.  Please follow up with your providers related to this follow up.   Your pelvic drain  will be flushed daily.  The dressings on your drains should be changed every 1-2 days.  Your drains will stay in place until surgery.  You have chronic kidney disease and will need to establish with renal.   Return for new, recurrent, or worsening symptoms.  Please ask your PCP to request records from this hospitalization so they know what was done and what the next steps will be.   Discharge wound care:   Complete by: As directed    Flush pelvic drain with 5 cc sterile normal saline daily Record output on all drains daily Change dressing on drains every 1-2 days Drains should remain in place until surgery Follow sacral decubitus outpatient, frequent turns, wound care   Flush IV access with Sodium Chloride 0.9% and Heparin 10 units/ml or 100 units/ml   Complete by: As directed    Home infusion instructions - [facility]   Complete by: As directed    Instructions: Flush IV access with Sodium Chloride 0.9% and Heparin 10units/ml or 100units/ml   Change dressing on IV access line: Weekly and  PRN   Instructions Cath Flo 51m: Administer for PICC Line occlusion and as ordered by physician for other access device   [facility] pharmacist to adjust dose for: Vancomycin, Aminoglycosides and other anti-infective therapies as requested by physician   Increase activity slowly   Complete by: As directed    Method of administration may be changed at the discretion of home infusion pharmacist based upon assessment of the patient and/or caregiver's ability to self-administer the medication ordered   Complete by: As directed      Allergies as of 01/08/2020      Reactions   Nsaids Other (See Comments)   Bleeding GI Ulceration      Medication List    STOP taking these medications   DULoxetine 30 MG capsule Commonly known as: CYMBALTA   midodrine 2.5 MG tablet Commonly known as: PROAMATINE     TAKE these medications   cyanocobalamin 1000 MCG/ML injection Commonly known  as: (VITAMIN B-12) Inject 1,000 mcg into the muscle every 30 (thirty) days.   docusate sodium 100 MG capsule Commonly known as: COLACE Take 1 capsule (100 mg total) by mouth 2 (two) times daily as needed for mild constipation.   feeding supplement (ENSURE ENLIVE) Liqd Take 237 mLs by mouth 3 (three) times daily between meals.   fluconazole 100 MG tablet Commonly known as: Diflucan Take 2 tablets (200 mg total) by mouth daily for 11 days. Continue through 01/14/2020   fluconazole 100 MG tablet Commonly known as: Diflucan Take 1 tablet (100 mg total) by mouth daily. Start taking on: January 15, 2020   gabapentin 100 MG capsule Commonly known as: NEURONTIN Take 2 capsules (200 mg total) by mouth daily. Start taking on: January 09, 2020 What changed:   medication strength  how much to take  when to take this   HYDROcodone-acetaminophen 5-325 MG tablet Commonly known as: NORCO/VICODIN Take 1-2 tablets by mouth every 4 (four) hours as needed for up to 10 doses for moderate pain or severe pain (1 moderate, 2 severe).   hydrOXYzine 10 MG tablet Commonly known as: ATARAX/VISTARIL Take 1 tablet (10 mg total) by mouth 3 (three) times daily as needed for itching or anxiety.   meropenem  IVPB Commonly known as: MERREM Inject 500 mg into the vein every 12 (twelve) hours for 11 days. Indication:  Pelvic Abscess First Dose: no Last Day of Therapy:  01/14/2020 Labs - Once weekly:  CBC/D and BMP, Labs - Every other week:  ESR and CRP Method of administration: Mini-Bag Plus / Gravity Method of administration may be changed at the discretion of home infusion pharmacist based upon assessment of the patient and/or caregiver's ability to self-administer the medication ordered.   multivitamin with minerals Tabs tablet Take 1 tablet by mouth daily.   sevelamer carbonate 800 MG tablet Commonly known as: RENVELA Take 1 tablet (800 mg total) by mouth 3 (three) times daily with meals.    sodium bicarbonate 650 MG tablet Take 1 tablet (650 mg total) by mouth 2 (two) times daily.   tamsulosin 0.4 MG Caps capsule Commonly known as: FLOMAX Take 0.4 mg by mouth daily.            Discharge Care Instructions  (From admission, onward)         Start     Ordered   01/08/20 0000  Change dressing on IV access line weekly and PRN  (Home infusion instructions - [facility] )  01/08/20 1118   01/08/20 0000  Discharge wound care:       Comments: Flush pelvic drain with 5 cc sterile normal saline daily Record output on all drains daily Change dressing on drains every 1-2 days Drains should remain in place until surgery Follow sacral decubitus outpatient, frequent turns, wound care   01/08/20 1118         Allergies  Allergen Reactions  . Nsaids Other (See Comments)    Bleeding GI Ulceration    Follow-up Information    Michael Boston, MD. Go on 01/25/2020.   Specialty: General Surgery Why: Arrive at 11:00 AM for 11:15 AM appointment.  Contact information: [address] [place] [address] [phone]        [facility].   Contact information: [place] [phone]       [facility].   Contact information: [address] [place] [address] [phone]        [facility] [place] Follow up.   Contact information: [place] [address] [place] Follow up.   Specialty: Radiology Contact information: [place] [id number] [place] [phone]               The results of significant diagnostics from this hospitalization (including imaging, microbiology, ancillary and laboratory) are listed below for reference.    Significant Diagnostic Studies: CT ABDOMEN PELVIS WO CONTRAST  Result  Date: 12/30/2019 CLINICAL DATA:  Abdominal pain. EXAM: CT ABDOMEN AND PELVIS WITHOUT CONTRAST TECHNIQUE: Multidetector CT imaging of the abdomen and pelvis was performed following the standard protocol without IV contrast. COMPARISON:  December 24, 2019. FINDINGS: Lower chest: Small bilateral pleural effusions are noted with adjacent subsegmental atelectasis. Hepatobiliary: No focal liver abnormality is seen. Status post cholecystectomy. No biliary dilatation. Pancreas: Unremarkable. No pancreatic ductal dilatation or surrounding inflammatory changes. Spleen: Normal in size without focal abnormality. Adrenals/Urinary Tract: Adrenal glands appear normal. Interval placement of bilateral percutaneous nephrostomies. Left ureteral stent is in grossly good position. Right ureteral stent noted on prior exam has been removed. No definite hydronephrosis or renal obstruction is seen at this time. Foley catheter is noted within the urinary bladder. Perivesical fluid collection is again noted, which currently measures 6.0 x 4.3 cm in the transverse orientation which may be slightly enlarged compared to prior exam. Fluid within urinary bladder appears to be high density suggesting possible hemorrhage. Stomach/Bowel: Status post gastric bypass. There is no evidence of bowel obstruction or inflammation. The appendix appears normal. Vascular/Lymphatic: Aortic atherosclerosis. No enlarged abdominal or pelvic lymph nodes. Reproductive: Prostate is unremarkable. Other: Mild anasarca is noted.  No definite hernia is noted. Musculoskeletal: No acute or significant osseous findings. IMPRESSION: 1. Interval placement of bilateral percutaneous nephrostomies. Left ureteral stent is in grossly good position. Right ureteral stent noted on prior exam has been removed. No definite hydronephrosis or renal obstruction is seen at this time. 2. Perivesical fluid collection is again noted, which currently measures 6.0 x 4.3 cm in the transverse  orientation which may be slightly enlarged compared to prior exam. Fluid within urinary bladder appears to be high density suggesting possible hemorrhage. 3. Small bilateral pleural effusions are noted with adjacent subsegmental atelectasis. 4. Mild anasarca. 5. Aortic atherosclerosis. Aortic Atherosclerosis (ICD10-I70.0). Electronically Signed   By: Bobbe Medico.D.  On: 12/30/2019 12:47   US PELVIS LIMITED (TRANSABDOMINAL ONLY)  Result Date: 12/30/2019 CLINICAL DATA:  Pelvic fluid collection. EXAM: LIMITED ULTRASOUND OF PELVIS TECHNIQUE: Limited transabdominal ultrasound examination of the pelvis was performed. COMPARISON:  Noncontrast CT earlier this day. Multiple prior abdominopelvic CT. FINDINGS: Foley catheter decompresses the urinary bladder. Posterior to the catheter balloon is heterogeneous 5.3 x 3.0 x 5.2 cm avascular lesion, corresponding to that seen on CT. There is no internal or peripheral vascularity. There is Tison Leibold small amount of free fluid in the pelvis. IMPRESSION: 1. Heterogeneous lesion in the bladder wall posterior to the Foley catheter balloon measuring 5.3 x 3.0 x 5.2 cm. It is unclear if this represents Kadejah Sandiford solid mass or walled off complex fluid collection. 2. Small amount of simple free fluid in the pelvis. Electronically Signed   By: Keith Rake M.D.   On: 12/30/2019 18:43   US RENAL  Result Date: 12/25/2019 CLINICAL DATA:  Hydronephrosis. EXAM: RENAL / URINARY TRACT ULTRASOUND COMPLETE COMPARISON:  November 12, 2019 FINDINGS: Right Kidney: Renal measurements: 11.3 x 6.4 x 7.9 cm = volume: 300 mL. Marked hydronephrosis. Increased echogenicity of the renal cortex. Left Kidney: Renal measurements: 10.8 x 9.7 x 10.2 cm = volume: 559 mL. Marked hydronephrosis. Increased echogenicity of the renal cortex. Echogenic material within the lower pole calices may represent residual contrast or inspissated debris/blood products. Stent artifact is seen in the left renal pelvis. Bladder: Mildly  distended around urinary Foley. Again seen is isoechoic avascular material within the dependent portion of the urinary bladder. Other: None. IMPRESSION: 1. Marked bilateral hydronephrosis, worse on the left. 2. Increased cortical echogenicity of the kidneys, consistent with medical renal disease. 3. Left ureteral stent is seen. The right ureteral stent is not seen sonographically. 4. Mildly distended urinary bladder around urinary Foley with isoechoic avascular material within the dependent portion of the urinary bladder, possibly representing debris or organizing blood products. 5. Increased echogenicity within the left lower pole renal calices may represent blood products or inspissated debris. Electronically Signed   By: Fidela Salisbury M.D.   On: 12/25/2019 16:58   DG C-Arm 1-60 Min-No Report  Result Date: 12/25/2019 Fluoroscopy was utilized by the requesting physician.  No radiographic interpretation.   CT Renal Stone Study  Addendum Date: 12/24/2019   ADDENDUM REPORT: 12/24/2019 12:35 ADDENDUM: The possibility of Thanh Pomerleau leak in the posterior urinary bladder in this patient thought to have Faelynn Wynder fistula was again discussed with the provider. Additionally, the presence of marked bilateral hydronephrosis, worse on the RIGHT also was discussed. Perhaps Kambrie Eddleman cystogram prior to stented exchange if will not significantly delay care could be helpful for further management as clinically warranted. This would avoid the confounding effect of contrast which could be introduced during stent exchange. Urologic consultation was suggested. These results were called by telephone at the time of interpretation on 12/24/2019 at 12:34 pm to provider [facility] , who verbally acknowledged these results. Electronically Signed   By: Zetta Bills M.D.   On: 12/24/2019 12:35   Result Date: 12/24/2019 CLINICAL DATA:  Flank pain, kidney stone suspected EXAM: CT ABDOMEN AND PELVIS WITHOUT CONTRAST TECHNIQUE: Multidetector CT imaging  of the abdomen and pelvis was performed following the standard protocol without IV contrast. COMPARISON:  12/03/2019 FINDINGS: Lower chest: Lung bases are clear. No consolidation. No pleural effusion. Hepatobiliary: Liver grossly normal.  Post cholecystectomy. Pancreas: No peripancreatic stranding or ductal dilation. Spleen: Spleen normal in size and contour. Adrenals/Urinary Tract: Adrenal glands are normal.  Moderate to marked bilateral collecting system and renal pelvic distension increased when compared to 12/03/2019. Ureteral stents are in place, proximal loops within the area of the renal pelvis bilaterally, distal loops within the urinary bladder. Gas within the urinary bladder and thickened wall. Perivesical fluid collection with reaccumulation of some fluid following drain removal within the rectoprostatic region measuring 6.6 x 2.3 x 6.2 cm. Foley catheter remains in place. Stomach/Bowel: Post gastric bypass procedure. No significant colonic thickening normal appendix. Vascular/Lymphatic: Aortic atheromatous plaque. No aneurysmal dilation. There is no gastrohepatic or hepatoduodenal ligament lymphadenopathy. No retroperitoneal or mesenteric lymphadenopathy. No pelvic sidewall lymphadenopathy. Reproductive: Fluid collection about the bladder. Prostate not well evaluated. Other: No free air. Postoperative changes associated with prior herniorrhaphy in the LEFT lower quadrant. Musculoskeletal: Signs of prior trauma to the RIGHT and LEFT chest with rib fractures of varying ages. No acute musculoskeletal process or destructive bone finding. IMPRESSION: 1. Reaccumulation of small to moderate volume of fluid above the urinary bladder in this patient with suspected defect in the urinary bladder. No adjacent colonic thickening. Study limited by lack of rectal or vesicle contrast with respect to detection of colovesical fistula though the fat plane appears reasonably well preserved between the collection in the colon  on the current study. 2. Interval development of moderate to marked bilateral hydroureteronephrosis despite presence of bilateral ureteral stents. 3. Aortic atherosclerosis. Call is out to the referring provider to further discuss findings in the above case. Aortic Atherosclerosis (ICD10-I70.0). Electronically Signed: By: Zetta Bills M.D. On: 12/24/2019 12:18   IR NEPHROSTOMY PLACEMENT LEFT  Result Date: 12/27/2019 INDICATION: 54 year old male with obstructed bilateral collecting system EXAM: IR NEPHROSTOMY PLACEMENT RIGHT; IR NEPHROSTOMY PLACEMENT LEFT COMPARISON:  None. MEDICATIONS: 400 mg Cipro; The antibiotic was administered in an appropriate time frame prior to skin puncture. ANESTHESIA/SEDATION: Fentanyl 1.0 mcg IV; Versed 50 mg IV Moderate Sedation Time:  16 minutes The patient was continuously monitored during the procedure by the interventional radiology nurse under my direct supervision. CONTRAST:  17m OMNIPAQUE IOHEXOL 300 MG/ML SOLN - administered into the collecting system(s) FLUOROSCOPY TIME:  Fluoroscopy Time: 2 minutes 0 seconds (60 mGy). COMPLICATIONS: None PROCEDURE: Informed written consent was obtained from the patient after Kaito Schulenburg thorough discussion of the procedural risks, benefits and alternatives. All questions were addressed. Maximal Sterile Barrier Technique was utilized including caps, mask, sterile gowns, sterile gloves, sterile drape, hand hygiene and skin antiseptic. Enriqueta Augusta timeout was performed prior to the initiation of the procedure. Patient was placed in the prone position on the fluoroscopy table. Ultrasound survey was performed with images stored and sent to PACs. The bilateral flank region prepped with Betadine, draped in usual sterile fashion, infiltrated locally with 1% lidocaine. Under real-time ultrasound guidance, Wen Munford 21-gauge trocar needle was advanced into Ariza Evans posterior lower pole calyx of the left collecting system. Ultrasound image documentation was saved. Urine  spontaneously returned through the needle. An 018 micro wire was then guided into the collecting system. Deena Shaub small incision was made with 11 blade scalpel, and the needle was exchanged over Wilmore Holsomback guidewire for transitional Envy system. The wire and the inner cannula were removed. Contrast injection confirmed appropriate positioning. The Accustick catheter was then exchanged over Galilea Quito guidewire for an 10 French pigtail drain, formed centrally within the left renal collecting system. Contrast injection confirms appropriate positioning and patency. The drain was then secured externally with 0 Prolene suture and placed to external drain bag. The right flank region was then infiltrated locally with 1% lidocaine.  Under real-time ultrasound guidance, Gay Moncivais 21-gauge trocar needle was advanced into Jemari Hallum posterior lower pole calyx of the right collecting system. Ultrasound image documentation was saved. Urine spontaneously returned through the needle. An 018 micro wire was then guided into the collecting system. Skyrah Krupp small incision was made with 11 blade scalpel, and the needle was exchanged over Everrett Lacasse guidewire for transitional Envy system. The wire and the inner cannula were removed. Contrast injection confirmed appropriate positioning. The 4 French catheter was then exchanged over Draycen Leichter guidewire for an 10 French pigtail drain, formed centrally within the right renal collecting system. Contrast injection confirms appropriate positioning and patency. The drain was then secured externally with 0 Prolene suture and placed to external drain bag. Patient tolerated the procedure well and remained hemodynamically stable throughout. No complications were encountered and no significant blood loss was encountered. EBL equals 0 IMPRESSION: Status post image guided placement of bilateral percutaneous nephrostomy. Signed, Dulcy Fanny. Dellia Nims, RPVI [facility] Electronically Signed   By: Corrie Mckusick  D.O.   On: 12/27/2019 16:45   IR NEPHROSTOMY PLACEMENT RIGHT  Result Date: 12/27/2019 INDICATION: 54 year old male with obstructed bilateral collecting system EXAM: IR NEPHROSTOMY PLACEMENT RIGHT; IR NEPHROSTOMY PLACEMENT LEFT COMPARISON:  None. MEDICATIONS: 400 mg Cipro; The antibiotic was administered in an appropriate time frame prior to skin puncture. ANESTHESIA/SEDATION: Fentanyl 1.0 mcg IV; Versed 50 mg IV Moderate Sedation Time:  16 minutes The patient was continuously monitored during the procedure by the interventional radiology nurse under my direct supervision. CONTRAST:  44m OMNIPAQUE IOHEXOL 300 MG/ML SOLN - administered into the collecting system(s) FLUOROSCOPY TIME:  Fluoroscopy Time: 2 minutes 0 seconds (60 mGy). COMPLICATIONS: None PROCEDURE: Informed written consent was obtained from the patient after Khalif Stender thorough discussion of the procedural risks, benefits and alternatives. All questions were addressed. Maximal Sterile Barrier Technique was utilized including caps, mask, sterile gowns, sterile gloves, sterile drape, hand hygiene and skin antiseptic. Allyne Hebert timeout was performed prior to the initiation of the procedure. Patient was placed in the prone position on the fluoroscopy table. Ultrasound survey was performed with images stored and sent to PACs. The bilateral flank region prepped with Betadine, draped in usual sterile fashion, infiltrated locally with 1% lidocaine. Under real-time ultrasound guidance, Emilea Goga 21-gauge trocar needle was advanced into Aslan Himes posterior lower pole calyx of the left collecting system. Ultrasound image documentation was saved. Urine spontaneously returned through the needle. An 018 micro wire was then guided into the collecting system. Adeoluwa Silvers small incision was made with 11 blade scalpel, and the needle was exchanged over Kervin Bones guidewire for transitional Envy system. The wire and the inner cannula were removed. Contrast injection confirmed appropriate positioning. The Accustick  catheter was then exchanged over Kieley Akter guidewire for an 10 French pigtail drain, formed centrally within the left renal collecting system. Contrast injection confirms appropriate positioning and patency. The drain was then secured externally with 0 Prolene suture and placed to external drain bag. The right flank region was then infiltrated locally with 1% lidocaine. Under real-time ultrasound guidance, Rebeckah Masih 21-gauge trocar needle was advanced into Miyo Aina posterior lower pole calyx of the right collecting system. Ultrasound image documentation was saved. Urine spontaneously returned through the needle. An 018 micro wire was then guided into the collecting system. Aboubacar Matsuo small incision was made with 11 blade scalpel, and the needle was exchanged over Khyren Hing guidewire for transitional Envy system. The wire and the inner cannula were removed. Contrast injection confirmed appropriate positioning. The 4  French catheter was then exchanged over Rendon Howell guidewire for an 10 French pigtail drain, formed centrally within the right renal collecting system. Contrast injection confirms appropriate positioning and patency. The drain was then secured externally with 0 Prolene suture and placed to external drain bag. Patient tolerated the procedure well and remained hemodynamically stable throughout. No complications were encountered and no significant blood loss was encountered. EBL equals 0 IMPRESSION: Status post image guided placement of bilateral percutaneous nephrostomy. Signed, Dulcy Fanny. Dellia Nims, RPVI [facility] Radiology Electronically Signed   By: Corrie Mckusick D.O.   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 EXAM: CT-guided drain placement MEDICATIONS: The patient is currently admitted to the hospital and receiving  intravenous antibiotics. The antibiotics were administered within an appropriate time frame prior to the initiation of the procedure. ANESTHESIA/SEDATION: Fentanyl 100 mcg IV; Versed 2 mg IV Moderate Sedation Time:  21 minutes The patient was continuously monitored during the procedure by the interventional radiology nurse under my direct supervision. COMPLICATIONS: None immediate. PROCEDURE: Informed written consent was obtained from the patient after Ezmeralda Stefanick thorough discussion of the procedural risks, benefits and alternatives. All questions were addressed. Maximal Sterile Barrier Technique was utilized including caps, mask, sterile gowns, sterile gloves, sterile drape, hand hygiene and skin antiseptic. Lilliam Chamblee timeout was performed prior to the initiation of the procedure. Mikhia Dusek planning axial CT scan was performed. The fluid collection in the retro vesicular space was successfully identified. Tramell Piechota suitable skin entry site was selected to allow for Zaylin Runco left transgluteal/parasacral approach. The overlying skin was sterilely prepped and draped in the standard fashion using chlorhexidine skin prep. Local anesthesia was attained by infiltration with 1% lidocaine. Kourtlyn Charlet small dermatotomy was made. Under intermittent CT guidance, an 18 gauge trocar needle was carefully advanced adjacent to the rectum and into the suspected fluid collection. Danaka Llera 0.35 wire was coiled in the fluid collection. The needle was exchanged for Angelina Venard fascial dilator and the soft tissue tract was dilated to 12 [place]. Galen Malkowski Cook 12 [place] all-purpose drainage catheter was then advanced over the wire and formed in the fluid collection. Aspiration yields approximately 80 mL of thick, purulent and foul-smelling fluid. Kariya Lavergne sample was sent for Gram stain and culture. The drainage catheter was flushed and connected to JP bulb suction before being secured to the skin with 0 Prolene suture. The patient tolerated the procedure well. IMPRESSION: Successful placement of Martel Galvan left transgluteal  approach 12 French drainage catheter into the retro vesicular fluid collection. Aspiration yields thick, foul-smelling purulent fluid. PLAN: 1. Maintain drain to JP bulb suction. 2. Flush drainage catheter daily. 3. Drainage catheter will likely remain in place until the time of definitive surgery. Electronically Signed   By: Jacqulynn Cadet M.D.   On: 12/31/2019 16:23    Microbiology: Recent Results (from the past 240 hour(s))  Aerobic/Anaerobic Culture (surgical/deep wound)     Status: None   Collection Time: 12/31/19  1:06 PM   Specimen: Abscess  Result Value Ref Range Status   Specimen Description   Final    ABSCESS Performed at [facility] [address], [place], [place] [address]    Special Requests   Final    Normal Performed at [facility], [place] [address] [place], [place] [address]    Gram Stain   Final  MANY WBC PRESENT, PREDOMINANTLY PMN NO ORGANISMS SEEN    Culture   Final    FEW CANDIDA ALBICANS NO ANAEROBES ISOLATED Performed at [facility], [place] [address] [place], [place] [address]    Report Status 01/05/2020 FINAL  Final  Respiratory Panel by RT PCR (Flu Harsha Yusko&B, Covid) - Nasopharyngeal Swab     Status: None   Collection Time: 01/07/20  5:01 PM   Specimen: Nasopharyngeal Swab  Result Value Ref Range Status   SARS Coronavirus 2 by RT PCR NEGATIVE NEGATIVE Final    Comment: (NOTE) SARS-CoV-2 target nucleic acids are NOT DETECTED.  The SARS-CoV-2 RNA is generally detectable in upper respiratoy specimens during the acute phase of infection. The lowest concentration of SARS-CoV-2 viral copies this assay can detect is 131 copies/mL. Farryn Linares negative result does not preclude SARS-Cov-2 infection and should not be used as the sole basis for treatment or other patient management decisions. Nakota Elsen negative result may occur with  improper specimen collection/handling, submission of specimen other than nasopharyngeal swab, presence  of viral mutation(s) within the areas targeted by this assay, and inadequate number of viral copies (<131 copies/mL). Cassondra Stachowski negative result must be combined with clinical observations, patient history, and epidemiological information. The expected result is Negative.  Fact Sheet for Patients:  PinkCheek.be  Fact Sheet for Healthcare Providers:  GravelBags.it  This test is no t yet approved or cleared by the [place] FDA and  has been authorized for detection and/or diagnosis of SARS-CoV-2 by FDA under an Emergency Use Authorization (EUA). This EUA will remain  in effect (meaning this test can be used) for the duration of the COVID-19 declaration under Section 564(b)(1) of the Act, 21 U.S.C. section 360bbb-3(b)(1), unless the authorization is terminated or revoked sooner.     Influenza Rhonda Linan by PCR NEGATIVE NEGATIVE Final   Influenza B by PCR NEGATIVE NEGATIVE Final    Comment: (NOTE) The Xpert Xpress SARS-CoV-2/FLU/RSV assay is intended as an aid in  the diagnosis of influenza from Nasopharyngeal swab specimens and  should not be used as [place] sole basis for treatment. Nasal washings and  aspirates are unacceptable for Xpert Xpress SARS-CoV-2/FLU/RSV  testing.  Fact Sheet for Patients: PinkCheek.be  Fact Sheet for Healthcare Providers: GravelBags.it  This test is not yet approved or cleared by the [place] FDA and  has been authorized for detection and/or diagnosis of SARS-CoV-2 by  FDA under an Emergency Use Authorization (EUA). This EUA will remain  in effect (meaning this test can be used) for the duration of the  Covid-19 declaration under Section 564(b)(1) of the Act, 21  U.S.C. section 360bbb-3(b)(1), unless the authorization is  terminated or revoked. Performed at [facility], [place] [address], [place], [place] [address]       Labs: Basic Metabolic Panel: Recent Labs  Lab 01/04/20 0453 01/05/20 0558 01/06/20 0507 01/07/20 0449 01/08/20 0518  NA 143 142 141 142 142  K 3.9 4.3 5.1 5.3* 4.9  CL 112* 108 110 109 107  CO2 23 26 24 26 26   GLUCOSE 77 140* 83 80 93  BUN 47* 53* 52* 50* 50*  CREATININE 5.74* 5.94* 6.19* 5.99* 5.62*  CALCIUM 7.4* 7.5* 7.5* 7.8* 8.0*  MG 2.0 2.1 2.2 2.0 2.2  PHOS 4.4 4.2 4.6 4.8* 4.6   Liver Function Tests: Recent Labs  Lab 01/04/20 0453 01/05/20 0558 01/06/20 0507 01/07/20 0449 01/08/20 0518  AST  --   --   --  37 26  ALT  --   --   --  19 17  ALKPHOS  --   --   --  86 81  BILITOT  --   --   --  0.3 0.7  PROT  --   --   --  5.7* 5.6*  ALBUMIN 1.9* 2.0* 1.9* 2.0*  2.0* 2.0*   No results for input(s): LIPASE, AMYLASE in the last 168 hours. No results for input(s): AMMONIA in the last 168 hours. CBC: Recent Labs  Lab 01/04/20 0453 01/05/20 0558 01/06/20 0507 01/07/20 0449 01/08/20 0518  WBC 8.0 5.2 5.5 5.3 5.9  NEUTROABS 5.8 3.2 2.7 2.7 3.6  HGB 8.8* 9.0* 8.4* 8.8* 8.5*  HCT 27.9* 29.2* 27.8* 28.8* 28.1*  MCV 96.2 98.6 99.6 99.3 100.0  PLT 161 143* 143* 163 190   Cardiac Enzymes: No results for input(s): CKTOTAL, CKMB, CKMBINDEX, TROPONINI in the last 168 hours. BNP: BNP (last 3 results) No results for input(s): BNP in the last 8760 hours.  ProBNP (last 3 results) No results for input(s): PROBNP in the last 8760 hours.  CBG: No results for input(s): GLUCAP in the last 168 hours.     Signed:  Fayrene Helper MD.  [facility] 01/08/2020, 11:41 AM

## 2020-01-08 NOTE — NC FL2 (Signed)
[place] LEVEL OF CARE SCREENING TOOL     IDENTIFICATION  Patient Name: Chase [name] Birthdate: 04-05-1965 Sex: male Admission Date (Current Location): 12/24/2019  [facility] and [place] Number:  Herbalist and Address:  [facility],  [address] [place], [place]      Provider Number: [id number]  Attending Physician Name and Address:  Elodia Florence., *  Relative Name and Phone Number:  Ruffin, Lada   [phone]    Current Level of Care: Hospital Recommended Level of Care: [place] Prior Approval Number:    Date Approved/Denied:   PASRR Number: [id number]  Discharge Plan: SNF    Current Diagnoses: Patient Active Problem List   Diagnosis Date Noted  . Incisional hernia - epigastric 01/04/2020  . Chronic narcotic use 01/02/2020  . Anemia in chronic kidney disease 01/02/2020  . ATN (acute tubular necrosis) ([place]) 12/31/2019  . Candida UTI 12/31/2019  . Electrolyte abnormality 12/31/2019  . Increased anion gap metabolic acidosis 95/63/8756  . Microcytic anemia 12/31/2019  . ABLA (acute blood loss anemia) 12/31/2019  . Anxiety and depression 12/31/2019  . History of CVA (cerebrovascular accident) 12/31/2019  . BMI 24.0-24.9, adult 12/31/2019  . Bilateral ureteral obstruction s/p perc nephrosotmy & stenting 12/30/2019  . AKI (acute kidney injury) ([place]) 12/24/2019  . 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 Pelvic abscess with colovescial fistula 11/15/2019  . E. coli UTI (urinary tract infection) 11/15/2019  . PTSD (post-traumatic stress disorder)   . Chemical exposure   . Cervical radiculopathy   . Septic shock ([place]) 11/08/2019  . Protein-calorie malnutrition, severe 10/23/2019  . Cerebral embolism with cerebral infarction 10/18/2019  . Acute left-sided weakness 10/17/2019  . CKD (chronic kidney disease), stage IV ([place]) 10/17/2019  . Chronic pain 10/17/2019  . Fall 10/17/2019  . Pseudomonas  urinary tract infection 08/24/2019  . BPH (benign prostatic hyperplasia) 08/24/2019  . Acute renal failure superimposed on stage 4 chronic kidney disease ([employer]) 08/24/2019  . Anemia 08/24/2019  . Dyspnea 08/24/2019  . History of COVID-19 08/24/2019  . History of elevated PSA 08/24/2019  . 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 Hyponatremia 08/24/2019  . Metabolic acidosis 43/32/9518  . Skin lesion of neck 08/24/2019  . Toxic metabolic encephalopathy 84/16/6063  . Cervical spondylosis without myelopathy 06/19/2016  . Neck pain 09/04/2015  . History of stomach ulcers 05/19/2013  . Male erectile dysfunction 05/19/2013  . Hodgkin lymphoma, unspecified, unspecified site ([place]) 12/10/2012  . Iron deficiency anemia 12/10/2012  . Pernicious anemia 12/10/2012  . Arthralgia of multiple sites 04/20/2012  . Postlaminectomy syndrome, not elsewhere classified 04/20/2012    Orientation RESPIRATION BLADDER Height & Weight     Self, Time, Situation, Place  Normal Continent Weight: 139 lb 3.2 oz (63.1 kg) Height:  5\' 3"  (160 cm)  BEHAVIORAL SYMPTOMS/MOOD NEUROLOGICAL BOWEL NUTRITION STATUS      Continent Diet (see dc summary)  AMBULATORY STATUS COMMUNICATION OF NEEDS Skin   Limited Assist Verbally PU Stage and Appropriate Care ([place])   PU Stage 2 Dressing:  (PRN dressing change)                   Personal Care Assistance Level of Assistance  Bathing, Feeding, Dressing Bathing Assistance: Limited assistance Feeding assistance: Limited assistance Dressing Assistance: Limited assistance     Functional Limitations Info  Sight, Hearing, Speech Sight Info: Adequate Hearing Info: Adequate Speech Info: Adequate    SPECIAL CARE FACTORS FREQUENCY  PT (By licensed PT), OT (By licensed OT)     PT Frequency: 5x week OT Frequency: 5x week            Contractures Contractures Info: Not present    Additional Factors Info  Code Status, Allergies Code Status Info: Full Allergies  Info: Nsaids           Current Medications (01/08/2020):  This is the current hospital active medication list Current Facility-Administered Medications  Medication Dose Route Frequency Provider Last Rate Last Admin  . (feeding supplement) PROSource Plus liquid 30 mL  30 mL Oral BID BM Elodia [facility]., MD      . 0.9 %  sodium chloride infusion (Manually program via Guardrails IV Fluids)   Intravenous Once Claudia Desanctis, MD      . acetaminophen (TYLENOL) tablet 500-1,000 mg  500-1,000 mg Oral Q6H PRN Dwyane Dee, MD      . bisacodyl (DULCOLAX) suppository 10 mg  10 mg Rectal Daily Michael Boston, MD      . Chlorhexidine Gluconate Cloth 2 % PADS 6 each  6 each Topical Daily Marton Redwood III, MD   6 each at 01/08/20 0915  . fluconazole (DIFLUCAN) tablet 200 mg  200 mg Oral Daily Michael Boston, MD   200 mg at 01/08/20 0915  . gabapentin (NEURONTIN) capsule 200 mg  200 mg Oral Daily Dwyane Dee, MD   200 mg at 01/08/20 0915  . HYDROcodone-acetaminophen (NORCO/VICODIN) 5-325 MG per tablet 1-2 tablet  1-2 tablet Oral Q4H PRN Marylyn Ishihara, Tyrone A, DO   2 tablet at 01/08/20 1119  . hydrOXYzine (ATARAX/VISTARIL) tablet 10 mg  10 mg Oral TID PRN Modena Jansky, MD   10 mg at 01/08/20 1119  . meropenem (MERREM) 500 mg in sodium chloride 0.9 % 100 mL IVPB  500 mg Intravenous Gorden Harms, MD 200 mL/hr at 01/08/20 1121 500 mg at 01/08/20 1121  . multivitamin (RENA-VIT) tablet 1 tablet  1 tablet Oral QHS Elodia Florence., MD   1 tablet at 01/07/20 2121  . nicotine (NICODERM CQ - dosed in mg/24 hours) patch 14 mg  14 mg Transdermal Daily Kyle, Tyrone A, DO      . ondansetron (ZOFRAN) tablet 4 mg  4 mg Oral Q8H PRN Dwyane Dee, MD   4 mg at 01/05/20 2318  . polyethylene glycol (MIRALAX / GLYCOLAX) packet 17 g  17 g Oral Daily PRN Norm Parcel, PA-C      . sevelamer carbonate (RENVELA) tablet 800 mg  800 mg Oral TID WC Claudia Desanctis, MD   800 mg at 01/08/20 0915  . sodium  bicarbonate tablet 650 mg  650 mg Oral BID Roney Jaffe, MD   650 mg at 01/08/20 0915  . sodium chloride flush (NS) 0.9 % injection 5 mL  5 mL Intracatheter Q8H Wagner, Jaime, DO   5 mL at 01/07/20 1650  . sodium chloride flush (NS) 0.9 % injection 5 mL  5 mL Intracatheter Q8H Jacqulynn Cadet, MD   5 mL at 01/07/20 1650  . tamsulosin (FLOMAX) capsule 0.4 mg  0.4 mg Oral Daily Kyle, Tyrone A, DO   0.4 mg at 01/08/20 0915     Discharge Medications: Please see discharge summary for a list of discharge medications.  Relevant Imaging Results:  Relevant Lab Results:   Additional Information SSN: [id number]  Ross Ludwig, LCSW

## 2020-01-08 NOTE — TOC Transition Note (Addendum)
Transition of Care [facility]) - CM/SW Discharge Note   Patient Details  Name: Chase [name] MRN: [id number] Date of Birth: 1966/02/12  [facility] CM/SW Contact:  Ross Ludwig, LCSW Phone Number: 01/08/2020, 12:38 PM   Clinical Narrative:     Patient to be d/c'ed today to [place].  Patient and family agreeable to plans will transport via ems RN to call report to [phone] room 7008.  CSW spoke to patient's son Micco Bourbeau, Brooke Bonito., and he is aware patient is discharging today.  [facility] has approved SNF placement.   Final next level of care: Skilled Nursing Facility Barriers to Discharge: Barriers Resolved   Patient Goals and CMS Choice Patient states their goals for this hospitalization and ongoing recovery are:: To return to SNF for short term rehab and IV antibiotics CMS Medicare.gov Compare Post Acute Care list provided to:: Patient Represenative (must comment) Choice offered to / list presented to : Patient  Discharge Placement   Existing PASRR number confirmed : 12/25/19            Patient to be transferred to facility by: [facility] Name of family member notified: [name], Brooke Bonito. Patient and family notified of of transfer: 01/08/20  Discharge Plan and Services In-house Referral: Clinical Social Work                                   Social Determinants of Health (SDOH) Interventions     Readmission Risk Interventions Readmission Risk Prevention Plan 12/25/2019 11/19/2019  Transportation Screening Complete Complete  Medication Review Press photographer) Complete Complete  PCP or Specialist appointment within 3-5 days of discharge - Complete  HRI or [place] - Complete  SW Recovery Care/Counseling Consult - Complete  [place] - Complete  Some recent data might be hidden

## 2020-01-08 NOTE — Progress Notes (Signed)
Attempted to call to speak to the nurse at [facility] for report  unable to reach anyone to anwer the call and went to a VM.

## 2020-01-08 NOTE — Progress Notes (Signed)
Spoke with nurse Precious for report at [employer].

## 2020-01-25 ENCOUNTER — Other Ambulatory Visit: Payer: Self-pay | Admitting: Physician Assistant

## 2020-01-25 MED ORDER — CIPROFLOXACIN IN D5W 400 MG/200ML IV SOLN: 400.0000 mg | Freq: Once | INTRAVENOUS | Status: DC

## 2020-01-26 ENCOUNTER — Ambulatory Visit (HOSPITAL_COMMUNITY)
Admission: RE | Admit: 2020-01-26 | Discharge: 2020-01-26 | Disposition: A | Discharge status: Home / Self Care | Payer: No Typology Code available for payment source | Source: Ambulatory Visit | Attending: Radiology | Admitting: Radiology

## 2020-01-26 ENCOUNTER — Encounter (HOSPITAL_COMMUNITY): Payer: Self-pay

## 2020-01-26 ENCOUNTER — Other Ambulatory Visit: Payer: Self-pay

## 2020-01-26 DIAGNOSIS — N135 Crossing vessel and stricture of ureter without hydronephrosis: Secondary | ICD-10-CM | POA: Insufficient documentation

## 2020-01-26 DIAGNOSIS — N320 Bladder-neck obstruction: Secondary | ICD-10-CM | POA: Diagnosis not present

## 2020-01-26 HISTORY — PX: IR URETERAL STENT PLACEMENT EXISTING ACCESS RIGHT: IMG6074

## 2020-01-26 LAB — BASIC METABOLIC PANEL
Anion gap: 12 (ref 5–15)
BUN: 67 mg/dL — ABNORMAL HIGH (ref 6–20)
CO2: 19 mmol/L — ABNORMAL LOW (ref 22–32)
Calcium: 9.3 mg/dL (ref 8.9–10.3)
Chloride: 107 mmol/L (ref 98–111)
Creatinine, Ser: 5.76 mg/dL — ABNORMAL HIGH (ref 0.61–1.24)
GFR, Estimated: 11 mL/min — ABNORMAL LOW (ref >60)
Glucose, Bld: 97 mg/dL (ref 70–99)
Potassium: 6.6 mmol/L (ref 3.5–5.1)
Sodium: 138 mmol/L (ref 135–145)

## 2020-01-26 LAB — CBC
HCT: 30.3 % — ABNORMAL LOW (ref 39.0–52.0)
Hemoglobin: 9.4 g/dL — ABNORMAL LOW (ref 13.0–17.0)
MCH: 30.9 pg (ref 26.0–34.0)
MCHC: 31 g/dL (ref 30.0–36.0)
MCV: 99.7 fL (ref 80.0–100.0)
Platelets: 225 10*3/uL (ref 150–400)
RBC: 3.04 MIL/uL — ABNORMAL LOW (ref 4.22–5.81)
RDW: 15.2 % (ref 11.5–15.5)
WBC: 6.8 10*3/uL (ref 4.0–10.5)
nRBC: 0 % (ref 0.0–0.2)

## 2020-01-26 LAB — PROTIME-INR
INR: 1.2 (ref 0.8–1.2)
Prothrombin Time: 14.4 seconds (ref 11.4–15.2)

## 2020-01-26 MED ORDER — MIDAZOLAM HCL 2 MG/2ML IJ SOLN
INTRAMUSCULAR | Status: AC
  Filled 2020-01-26: qty 4

## 2020-01-26 MED ORDER — SODIUM CHLORIDE 0.9 % IV SOLN: INTRAVENOUS | Status: DC

## 2020-01-26 MED ORDER — FENTANYL CITRATE (PF) 100 MCG/2ML IJ SOLN
INTRAMUSCULAR | Status: AC
  Filled 2020-01-26: qty 2

## 2020-01-26 MED ORDER — LIDOCAINE HCL (PF) 1 % IJ SOLN
INTRAMUSCULAR | Status: AC | PRN
  Administered 2020-01-26: 5 mL

## 2020-01-26 MED ORDER — FENTANYL CITRATE (PF) 100 MCG/2ML IJ SOLN
INTRAMUSCULAR | Status: AC | PRN
  Administered 2020-01-26 (×2): 50 ug via INTRAVENOUS

## 2020-01-26 MED ORDER — CIPROFLOXACIN IN D5W 400 MG/200ML IV SOLN: 400.0000 mg | Freq: Two times a day (BID) | INTRAVENOUS | Status: DC

## 2020-01-26 MED ORDER — SODIUM ZIRCONIUM CYCLOSILICATE 10 G PO PACK
10.0000 g | PACK | Freq: Once | ORAL | Status: AC
  Administered 2020-01-26: 10 g via ORAL
  Filled 2020-01-26: qty 1

## 2020-01-26 MED ORDER — MIDAZOLAM HCL 2 MG/2ML IJ SOLN
INTRAMUSCULAR | Status: AC | PRN
  Administered 2020-01-26 (×2): 1 mg via INTRAVENOUS

## 2020-01-26 MED ORDER — LIDOCAINE HCL 1 % IJ SOLN
INTRAMUSCULAR | Status: AC
  Filled 2020-01-26: qty 20

## 2020-01-26 MED ORDER — IOHEXOL 300 MG/ML  SOLN
50.0000 mL | Freq: Once | INTRAMUSCULAR | Status: AC | PRN
  Administered 2020-01-26: 20 mL

## 2020-01-26 NOTE — Progress Notes (Signed)
Report called to Crist Fat, RN at [facility].

## 2020-01-26 NOTE — H&P (Signed)
Chief Complaint: Patient was seen in consultation today for right hydronephrosis management.   Referring Physician(s): Dr. Alinda Money  Supervising Physician: Arne Cleveland  Patient Status: [facility] - Out-pt  History of Present Illness: Chase [name] is a 54 y.o. male with a medical history significant for Hodgkin's lymphoma, COVID-19, PTSD, CVA, protein-calorie malnutrition, BPH, pelvic abscess with colovesical fistula, CKD Stage IV, and bilateral ureteral obstruction s/p nephrostomy tubes 12/27/19 and left stent placement.   The patient is tentatively scheduled for a robotic resection with  [name] for definitive repair of the colovesical fistula. IR has been asked to evaluate this patient for the placement of a right JJ stent in preparation for fistula repair to identify ureters intraoperatively.   Past Medical History:  Diagnosis Date  . Cervical radiculopathy    with left sided weakness  . Chemical exposure    service related injury  . Chronic pain   . CKD (chronic kidney disease), stage IV ([place]) 10/17/2019  . GIB (gastrointestinal bleeding)   . History of CVA (cerebrovascular accident) 12/31/2019  . 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 History of stomach ulcers 05/19/2013   Formatting of this note might be different from the original. Attributed to chronic ibuprofen use  . Hodgkin lymphoma, unspecified, unspecified site ([place]) 12/10/2012   Formatting of this note might be different from the original. In remission.  Marland Kitchen PTSD (post-traumatic stress disorder)   . Renal disorder    CKD  . Urinary retention    due to bladder outlet obstruction    Past Surgical History:  Procedure Laterality Date  . CYSTOSCOPY W/ URETERAL STENT PLACEMENT N/A 11/14/2019   Procedure: CYSTOSCOPY WITH RETROGRADE PYELOGRAM bilateral Wyvonnia Dusky STENT PLACEMENT left fulguration bladder . cystogram;  Surgeon: Raynelle Bring, MD;  Location: [facility] ORS;  Service: Urology;  Laterality: N/A;  . CYSTOSCOPY W/  URETERAL STENT PLACEMENT Bilateral 12/25/2019   Procedure: CYSTOSCOPY WITH bilateral  RETROGRADE PYELOGRAM/ leftURETERAL STENT PLACEMENT;  Surgeon: Lucas Mallow, MD;  Location: [facility];  Service: Urology;  Laterality: Bilateral;  . GASTRIC BYPASS    . HERNIA REPAIR    . IR NEPHROSTOMY PLACEMENT LEFT  12/27/2019  . IR NEPHROSTOMY PLACEMENT RIGHT  11/16/2019  . IR NEPHROSTOMY PLACEMENT RIGHT  12/27/2019  . IR SINUS/FIST TUBE CHK-NON GI  12/03/2019  . IR URETERAL STENT PLACEMENT EXISTING ACCESS RIGHT  12/08/2019    Allergies: Nsaids  Medications: Prior to Admission medications   Medication Sig Start Date End Date Taking? Authorizing Provider  cyanocobalamin (,VITAMIN B-12,) 1000 MCG/ML injection Inject 1,000 mcg into the muscle every 30 (thirty) days.  12/28/13   [provider]  docusate sodium (COLACE) 100 MG capsule Take 1 capsule (100 mg total) by mouth 2 (two) times daily as needed for mild constipation. 11/19/19   Antonieta Pert, MD  feeding supplement, ENSURE ENLIVE, (ENSURE ENLIVE) LIQD Take 237 mLs by mouth 3 (three) times daily between meals. Patient not taking: Reported on 12/24/2019 10/26/19   Cristal Ford, DO  fluconazole (DIFLUCAN) 100 MG tablet Take 1 tablet (100 mg total) by mouth daily. 01/15/20 03/15/20  Elodia Florence., MD  gabapentin (NEURONTIN) 100 MG capsule Take 2 capsules (200 mg total) by mouth daily. 01/09/20 02/08/20  Elodia Florence., MD  HYDROcodone-acetaminophen (NORCO/VICODIN) 5-325 MG tablet Take 1-2 tablets by mouth every 4 (four) hours as needed for up to 10 doses for moderate pain or severe pain (1 moderate, 2 severe). 11/19/19   Antonieta Pert, MD  hydrOXYzine (ATARAX/VISTARIL) 10 MG tablet Take 1 tablet (10 mg total) by mouth 3 (three) times daily as needed for itching or anxiety. 10/26/19   Cristal Ford, DO  Multiple Vitamin (MULTIVITAMIN WITH MINERALS) TABS tablet Take 1 tablet by mouth daily. 10/27/19   Mikhail, Velta Addison, DO  sevelamer  carbonate (RENVELA) 800 MG tablet Take 1 tablet (800 mg total) by mouth 3 (three) times daily with meals. 01/08/20 02/07/20  Elodia Florence., MD  sodium bicarbonate 650 MG tablet Take 1 tablet (650 mg total) by mouth 2 (two) times daily. 01/08/20 02/07/20  Elodia Florence., MD  tamsulosin (FLOMAX) 0.4 MG CAPS capsule Take 0.4 mg by mouth daily.     [provider]     Family History  Problem Relation Age of Onset  . Renal cancer Father   . Heart disease Paternal Grandfather     Social History   Socioeconomic History  . Marital status: Widowed    Spouse name: Not on file  . Number of children: Not on file  . Years of education: Not on file  . Highest education level: Not on file  Occupational History  . Not on file  Tobacco Use  . Smoking status: Never Smoker  . Smokeless tobacco: Current User  Vaping Use  . Vaping Use: Never used  Substance and Sexual Activity  . Alcohol use: Never  . Drug use: Never  . Sexual activity: Not on file  Other Topics Concern  . Not on file  Social History Narrative  . Not on file   Social Determinants of Health   Financial Resource Strain:   . Difficulty of Paying Living Expenses: Not on file  Food Insecurity:   . Worried About Charity fundraiser in the Last Year: Not on file  . Ran Out of Food in the Last Year: Not on file  Transportation Needs:   . Lack of Transportation (Medical): Not on file  . Lack of Transportation (Non-Medical): Not on file  Physical Activity:   . Days of Exercise per Week: Not on file  . Minutes of Exercise per Session: Not on file  Stress:   . Feeling of Stress : Not on file  Social Connections:   . Frequency of Communication with Friends and Family: Not on file  . Frequency of Social Gatherings with Friends and Family: Not on file  . Attends Religious Services: Not on file  . Active Member of Clubs or Organizations: Not on file  . Attends Archivist Meetings: Not on file  .  Marital Status: Not on file    Review of Systems: A 12 point ROS discussed and pertinent positives are indicated in the HPI above.  All other systems are negative.  Review of Systems  Constitutional: Negative for appetite change and fatigue.  Respiratory: Negative for cough and shortness of breath.   Cardiovascular: Negative for chest pain and leg swelling.  Gastrointestinal: Negative for abdominal pain, diarrhea, nausea and vomiting.  Genitourinary: Positive for flank pain.       Left pain related to PCN  Neurological: Negative for dizziness and headaches.    Vital Signs: BP 117/68   Pulse 63   Resp 17   SpO2 100%   Physical Exam Constitutional:      General: He is not in acute distress. HENT:     Mouth/Throat:     Mouth: Mucous membranes are moist.     Pharynx: Oropharynx is clear.  Cardiovascular:  Rate and Rhythm: Normal rate and regular rhythm.     Pulses: Normal pulses.     Heart sounds: Normal heart sounds.  Pulmonary:     Effort: Pulmonary effort is normal.     Breath sounds: Normal breath sounds.  Abdominal:     General: Bowel sounds are normal.     Palpations: Abdomen is soft.     Comments: Abscess drain, left lower back/upper gluteal area. JP bulb. Approximately 10 cc of dark red fluid in bulb.   Genitourinary:    Comments: Foley catheter; bilateral PCNs.  Skin:    General: Skin is warm and dry.  Neurological:     Mental Status: He is alert and oriented to person, place, and time.     Imaging: CT ABDOMEN PELVIS WO CONTRAST  Result Date: 12/30/2019 CLINICAL DATA:  Abdominal pain. EXAM: CT ABDOMEN AND PELVIS WITHOUT CONTRAST TECHNIQUE: Multidetector CT imaging of the abdomen and pelvis was performed following the standard protocol without IV contrast. COMPARISON:  December 24, 2019. FINDINGS: Lower chest: Small bilateral pleural effusions are noted with adjacent subsegmental atelectasis. Hepatobiliary: No focal liver abnormality is seen. Status post  cholecystectomy. No biliary dilatation. Pancreas: Unremarkable. No pancreatic ductal dilatation or surrounding inflammatory changes. Spleen: Normal in size without focal abnormality. Adrenals/Urinary Tract: Adrenal glands appear normal. Interval placement of bilateral percutaneous nephrostomies. Left ureteral stent is in grossly good position. Right ureteral stent noted on prior exam has been removed. No definite hydronephrosis or renal obstruction is seen at this time. Foley catheter is noted within the urinary bladder. Perivesical fluid collection is again noted, which currently measures 6.0 x 4.3 cm in the transverse orientation which may be slightly enlarged compared to prior exam. Fluid within urinary bladder appears to be high density suggesting possible hemorrhage. Stomach/Bowel: Status post gastric bypass. There is no evidence of bowel obstruction or inflammation. The appendix appears normal. Vascular/Lymphatic: Aortic atherosclerosis. No enlarged abdominal or pelvic lymph nodes. Reproductive: Prostate is unremarkable. Other: Mild anasarca is noted.  No definite hernia is noted. Musculoskeletal: No acute or significant osseous findings. IMPRESSION: 1. Interval placement of bilateral percutaneous nephrostomies. Left ureteral stent is in grossly good position. Right ureteral stent noted on prior exam has been removed. No definite hydronephrosis or renal obstruction is seen at this time. 2. Perivesical fluid collection is again noted, which currently measures 6.0 x 4.3 cm in the transverse orientation which may be slightly enlarged compared to prior exam. Fluid within urinary bladder appears to be high density suggesting possible hemorrhage. 3. Small bilateral pleural effusions are noted with adjacent subsegmental atelectasis. 4. Mild anasarca. 5. Aortic atherosclerosis. Aortic Atherosclerosis (ICD10-I70.0). Electronically Signed   By: Marijo Conception M.D.   On: 12/30/2019 12:47   US PELVIS LIMITED  (TRANSABDOMINAL ONLY)  Result Date: 12/30/2019 CLINICAL DATA:  Pelvic fluid collection. EXAM: LIMITED ULTRASOUND OF PELVIS TECHNIQUE: Limited transabdominal ultrasound examination of the pelvis was performed. COMPARISON:  Noncontrast CT earlier this day. Multiple prior abdominopelvic CT. FINDINGS: Foley catheter decompresses the urinary bladder. Posterior to the catheter balloon is heterogeneous 5.3 x 3.0 x 5.2 cm avascular lesion, corresponding to that seen on CT. There is no internal or peripheral vascularity. There is a small amount of free fluid in the pelvis. IMPRESSION: 1. Heterogeneous lesion in the bladder wall posterior to the Foley catheter balloon measuring 5.3 x 3.0 x 5.2 cm. It is unclear if this represents a solid mass or walled off complex fluid collection. 2. Small amount of simple free fluid  in the pelvis. Electronically Signed   By: Keith Rake M.D.   On: 12/30/2019 18:43   IR NEPHROSTOMY PLACEMENT LEFT  Result Date: 12/27/2019 INDICATION: 54 year old male with obstructed bilateral collecting system EXAM: IR NEPHROSTOMY PLACEMENT RIGHT; IR NEPHROSTOMY PLACEMENT LEFT COMPARISON:  None. MEDICATIONS: 400 mg Cipro; The antibiotic was administered in an appropriate time frame prior to skin puncture. ANESTHESIA/SEDATION: Fentanyl 1.0 mcg IV; Versed 50 mg IV Moderate Sedation Time:  16 minutes The patient was continuously monitored during the procedure by the interventional radiology nurse under my direct supervision. CONTRAST:  62mL OMNIPAQUE IOHEXOL 300 MG/ML SOLN - administered into the collecting system(s) FLUOROSCOPY TIME:  Fluoroscopy Time: 2 minutes 0 seconds (60 mGy). COMPLICATIONS: None PROCEDURE: Informed written consent was obtained from the patient after a thorough discussion of the procedural risks, benefits and alternatives. All questions were addressed. Maximal Sterile Barrier Technique was utilized including caps, mask, sterile gowns, sterile gloves, sterile drape, hand hygiene  and skin antiseptic. A timeout was performed prior to the initiation of the procedure. Patient was placed in the prone position on the fluoroscopy table. Ultrasound survey was performed with images stored and sent to PACs. The bilateral flank region prepped with Betadine, draped in usual sterile fashion, infiltrated locally with 1% lidocaine. Under real-time ultrasound guidance, a 21-gauge trocar needle was advanced into a posterior lower pole calyx of the left collecting system. Ultrasound image documentation was saved. Urine spontaneously returned through the needle. An 018 micro wire was then guided into the collecting system. A small incision was made with 11 blade scalpel, and the needle was exchanged over a guidewire for transitional Envy system. The wire and the inner cannula were removed. Contrast injection confirmed appropriate positioning. The Accustick catheter was then exchanged over a guidewire for an 10 French pigtail drain, formed centrally within the left renal collecting system. Contrast injection confirms appropriate positioning and patency. The drain was then secured externally with 0 Prolene suture and placed to external drain bag. The right flank region was then infiltrated locally with 1% lidocaine. Under real-time ultrasound guidance, a 21-gauge trocar needle was advanced into a posterior lower pole calyx of the right collecting system. Ultrasound image documentation was saved. Urine spontaneously returned through the needle. An 018 micro wire was then guided into the collecting system. A small incision was made with 11 blade scalpel, and the needle was exchanged over a guidewire for transitional Envy system. The wire and the inner cannula were removed. Contrast injection confirmed appropriate positioning. The 4 French catheter was then exchanged over a guidewire for an 10 French pigtail drain, formed centrally within the right renal collecting system. Contrast injection confirms appropriate  positioning and patency. The drain was then secured externally with 0 Prolene suture and placed to external drain bag. Patient tolerated the procedure well and remained hemodynamically stable throughout. No complications were encountered and no significant blood loss was encountered. EBL equals 0 IMPRESSION: Status post image guided placement of bilateral percutaneous nephrostomy. Signed, Dulcy Fanny. Dellia Nims, RPVI [facility] Radiology Electronically Signed   By: Corrie Mckusick D.O.   On: 12/27/2019 16:45   IR NEPHROSTOMY PLACEMENT RIGHT  Result Date: 12/27/2019 INDICATION: 54 year old male with obstructed bilateral collecting system EXAM: IR NEPHROSTOMY PLACEMENT RIGHT; IR NEPHROSTOMY PLACEMENT LEFT COMPARISON:  None. MEDICATIONS: 400 mg Cipro; The antibiotic was administered in an appropriate time frame prior to skin puncture. ANESTHESIA/SEDATION: Fentanyl 1.0 mcg IV; Versed 50 mg IV Moderate Sedation Time:  16 minutes  The patient was continuously monitored during the procedure by the interventional radiology nurse under my direct supervision. CONTRAST:  65mL OMNIPAQUE IOHEXOL 300 MG/ML SOLN - administered into the collecting system(s) FLUOROSCOPY TIME:  Fluoroscopy Time: 2 minutes 0 seconds (60 mGy). COMPLICATIONS: None PROCEDURE: Informed written consent was obtained from the patient after a thorough discussion of the procedural risks, benefits and alternatives. All questions were addressed. Maximal Sterile Barrier Technique was utilized including caps, mask, sterile gowns, sterile gloves, sterile drape, hand hygiene and skin antiseptic. A timeout was performed prior to the initiation of the procedure. Patient was placed in the prone position on the fluoroscopy table. Ultrasound survey was performed with images stored and sent to PACs. The bilateral flank region prepped with Betadine, draped in usual sterile fashion, infiltrated locally with 1% lidocaine. Under  real-time ultrasound guidance, a 21-gauge trocar needle was advanced into a posterior lower pole calyx of the left collecting system. Ultrasound image documentation was saved. Urine spontaneously returned through the needle. An 018 micro wire was then guided into the collecting system. A small incision was made with 11 blade scalpel, and the needle was exchanged over a guidewire for transitional Envy system. The wire and the inner cannula were removed. Contrast injection confirmed appropriate positioning. The Accustick catheter was then exchanged over a guidewire for an 10 French pigtail drain, formed centrally within the left renal collecting system. Contrast injection confirms appropriate positioning and patency. The drain was then secured externally with 0 Prolene suture and placed to external drain bag. The right flank region was then infiltrated locally with 1% lidocaine. Under real-time ultrasound guidance, a 21-gauge trocar needle was advanced into a posterior lower pole calyx of the right collecting system. Ultrasound image documentation was saved. Urine spontaneously returned through the needle. An 018 micro wire was then guided into the collecting system. A small incision was made with 11 blade scalpel, and the needle was exchanged over a guidewire for transitional Envy system. The wire and the inner cannula were removed. Contrast injection confirmed appropriate positioning. The 4 French catheter was then exchanged over a guidewire for an 10 French pigtail drain, formed centrally within the right renal collecting system. Contrast injection confirms appropriate positioning and patency. The drain was then secured externally with 0 Prolene suture and placed to external drain bag. Patient tolerated the procedure well and remained hemodynamically stable throughout. No complications were encountered and no significant blood loss was encountered. EBL equals 0 IMPRESSION: Status post image guided placement of  bilateral percutaneous nephrostomy. Signed, Dulcy Fanny. Dellia Nims, RPVI [facility] Radiology Electronically Signed   By: Corrie Mckusick D.O.   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 EXAM: CT-guided drain placement MEDICATIONS: The patient is currently admitted to the hospital and receiving intravenous antibiotics. The antibiotics were administered within an appropriate time frame prior to the initiation of the procedure. ANESTHESIA/SEDATION: Fentanyl 100 mcg IV; Versed 2 mg IV Moderate Sedation Time:  21 minutes The patient was continuously monitored during the procedure by the interventional radiology nurse under my direct supervision. COMPLICATIONS: None immediate. PROCEDURE: Informed written consent was obtained from the patient after a thorough discussion of the procedural risks, benefits and alternatives. All questions were addressed. Maximal Sterile Barrier Technique was utilized including caps, mask, sterile gowns, sterile gloves, sterile drape, hand hygiene and skin antiseptic. A  timeout was performed prior to the initiation of the procedure. A planning axial CT scan was performed. The fluid collection in the retro vesicular space was successfully identified. A suitable skin entry site was selected to allow for a left transgluteal/parasacral approach. The overlying skin was sterilely prepped and draped in the standard fashion using chlorhexidine skin prep. Local anesthesia was attained by infiltration with 1% lidocaine. A small dermatotomy was made. Under intermittent CT guidance, an 18 gauge trocar needle was carefully advanced adjacent to the rectum and into the suspected fluid collection. A 0.35 wire was coiled in the fluid collection. The needle was exchanged for a fascial  dilator and the soft tissue tract was dilated to 12 [place]. A Cook 12 [place] all-purpose drainage catheter was then advanced over the wire and formed in the fluid collection. Aspiration yields approximately 80 mL of thick, purulent and foul-smelling fluid. A sample was sent for Gram stain and culture. The drainage catheter was flushed and connected to JP bulb suction before being secured to the skin with 0 Prolene suture. The patient tolerated the procedure well. IMPRESSION: Successful placement of a left transgluteal approach 12 French drainage catheter into the retro vesicular fluid collection. Aspiration yields thick, foul-smelling purulent fluid. PLAN: 1. Maintain drain to JP bulb suction. 2. Flush drainage catheter daily. 3. Drainage catheter will likely remain in place until the time of definitive surgery. Electronically Signed   By: Jacqulynn Cadet M.D.   On: 12/31/2019 16:23    Labs:  CBC: Recent Labs    01/06/20 0507 01/07/20 0449 01/08/20 0518 01/26/20 1050  WBC 5.5 5.3 5.9 6.8  HGB 8.4* 8.8* 8.5* 9.4*  HCT 27.8* 28.8* 28.1* 30.3*  PLT 143* 163 190 225    COAGS: Recent Labs    10/17/19 1241 10/17/19 1241 10/17/19 1311 11/07/19 1836 11/07/19 1853 11/10/19 1052 12/08/19 1258 12/27/19 0450 01/26/20 1050  INR 1.5*   < > 1.6*   < >  --  1.3* 1.1 1.3* 1.2  APTT 43*  --  46*  --  31  --   --   --   --    < > = values in this interval not displayed.    BMP: Recent Labs    01/01/20 0430 01/01/20 0430 01/02/20 0427 01/02/20 0427 01/03/20 0506 01/03/20 0506 01/04/20 0453 01/05/20 0558 01/06/20 0507 01/07/20 0449 01/08/20 0518 01/26/20 1050  NA 143   < > 144   < > 143   < > 143   < > 141 142 142 138  K 3.5   < > 3.9   < > 3.9   < > 3.9   < > 5.1 5.3* 4.9 6.6*  CL 116*   < > 113*   < > 112*   < > 112*   < > 110 109 107 107  CO2 18*   < > 19*   < > 22   < > 23   < > 24 26 26  19*  GLUCOSE 73   < > 84   < > 81   < > 77   < > 83 80 93 97  BUN 60*   < > 61*   < > 53*    < > 47*   < > 52* 50* 50* 67*  CALCIUM 7.3*   < > 7.5*   < > 7.5*   < > 7.4*   < > 7.5* 7.8* 8.0* 9.3  CREATININE 6.54*   < > 6.27*   < >  6.42*   < > 5.74*   < > 6.19* 5.99* 5.62* 5.76*  GFRNONAA 9*   < > 9*   < > 9*   < > 10*   < > 9* 10* 10* 11*  GFRAA 10*  --  11*  --  10*  --  12*  --   --   --   --   --    < > = values in this interval not displayed.    LIVER FUNCTION TESTS: Recent Labs    12/25/19 0217 12/25/19 0217 12/28/19 0503 12/30/19 0603 01/05/20 0558 01/06/20 0507 01/07/20 0449 01/08/20 0518  BILITOT 0.3  --  0.3  --   --   --  0.3 0.7  AST 44*  --  28  --   --   --  37 26  ALT 76*  --  8  --   --   --  19 17  ALKPHOS 122  --  87  --   --   --  86 81  PROT 6.7  --  6.0*  --   --   --  5.7* 5.6*  ALBUMIN 2.7*   < > 2.2*   < > 2.0* 1.9* 2.0*  2.0* 2.0*   < > = values in this interval not displayed.    TUMOR MARKERS: No results for input(s): AFPTM, CEA, CA199, CHROMGRNA in the last 8760 hours.  Assessment and Plan:  Bilateral hydronephrosis/ureteral obstruction; bilateral PCNs; pending surgery for colovesical fistula repair: Chase [name], 54 year old male, presents today to the [facility] department for an image-guided right nephroureteral stent placement.  Risks and benefits of stent placement were discussed with the patient including, but not limited to, infection, bleeding, significant bleeding causing loss or decrease in renal function or damage to adjacent structures.   All of the patient's questions were answered, patient is agreeable to proceed.  The patient has been NPO. Labs and vitals have been reviewed.  Consent signed and in chart.  Thank you for this interesting consult.  I greatly enjoyed meeting Chase [name] and look forward to participating in their care.  A copy of this report was sent to the requesting provider on this date.  Electronically Signed: Soyla Dryer, AGACNP-BC [phone] 01/26/2020,  11:38 AM   I spent a total of  30 Minutes   in face to face in clinical consultation, greater than 50% of which was counseling/coordinating care for right nephroureteral stent placement.

## 2020-01-26 NOTE — Discharge Instructions (Signed)
Please call Interventional Radiology clinic [phone] with any questions or concerns.  You may remove your dressing and shower tomorrow.  DO NOT use EMLA cream for 2 weeks after port placement as this cream will remove surgical glue on your incision.  Catherine Stent Exchange, Care After This sheet gives you information about how to care for yourself after your procedure. Your health care provider may also give you more specific instructions. If you have problems or questions, contact your health care provider. What can I expect after the procedure? After the procedure, it is common to have:  Nausea.  Mild pain when you urinate. You may feel this pain in your lower back or lower abdomen. The pain should stop within a few minutes after you urinate. This may last for up to 1 week.  A small amount of blood in your urine for several days. Follow these instructions at home: Medicines  Take over-the-counter and prescription medicines only as told by your health care provider.  If you were prescribed an antibiotic medicine, take it as told by your health care provider. Do not stop taking the antibiotic even if you start to feel better.  Do not drive for 24 hours if you were given a sedative during your procedure.  Ask your health care provider if the medicine prescribed to you requires you to avoid driving or using heavy machinery. Activity  Rest as told by your health care provider.  Avoid sitting for a long time without moving. Get up to take short walks every 1-2 hours. This is important to improve blood flow and breathing. Ask for help if you feel weak or unsteady.  Return to your normal activities as told by your health care provider. Ask your health care provider what activities are safe for you. General instructions   Watch for any blood in your urine. Call your health care provider if the amount of blood in your urine increases.  If you have a catheter: ? Follow instructions from  your health care provider about taking care of your catheter and collection bag. ? Do not take baths, swim, or use a hot tub until your health care provider approves. Ask your health care provider if you may take showers. You may only be allowed to take sponge baths.  Drink enough fluid to keep your urine pale yellow.  Do not use any products that contain nicotine or tobacco, such as cigarettes, e-cigarettes, and chewing tobacco. These can delay healing after surgery. If you need help quitting, ask your health care provider.  Keep all follow-up visits as told by your health care provider. This is important. Contact a health care provider if:  You have pain that gets worse or does not get better with medicine, especially pain when you urinate.  You have difficulty urinating.  You feel nauseous or you vomit repeatedly during a period of more than 2 days after the procedure. Get help right away if:  Your urine is dark red or has blood clots in it.  You are leaking urine (have incontinence).  The end of the stent comes out of your urethra.  You cannot urinate.  You have sudden, sharp, or severe pain in your abdomen or lower back.  You have a fever.  You have swelling or pain in your legs.  You have difficulty breathing. Summary  After the procedure, it is common to have mild pain when you urinate that goes away within a few minutes after you urinate. This may last  for up to 1 week.  Watch for any blood in your urine. Call your health care provider if the amount of blood in your urine increases.  Take over-the-counter and prescription medicines only as told by your health care provider.  Drink enough fluid to keep your urine pale yellow. This information is not intended to replace advice given to you by your health care provider. Make sure you discuss any questions you have with your health care provider. Document Revised: 12/23/2017 Document Reviewed: 12/24/2017 Elsevier  Patient Education  2020 [employer].   Percutaneous Nephrostomy Exchange, Care After This sheet gives you information about how to care for yourself after your procedure. Your health care provider may also give you more specific instructions. If you have problems or questions, contact your health care provider. What can I expect after the procedure? After the procedure, it is common to have:  Some soreness where the nephrostomy tube was inserted (tube insertion site).  Blood-tinged drainage from the nephrostomy tube for the first 24 hours. Follow these instructions at home: Activity  Return to your normal activities as told by your health care provider. Ask your health care provider what activities are safe for you.  Avoid activities that may cause the nephrostomy tubing to bend.  Do not take baths, swim, or use a hot tub until your health care provider approves. Ask your health care provider if you can take showers. Cover the nephrostomy tube dressing with a watertight covering when you take a shower.  Donot drive for 24 hours if you were given a medicine to help you relax (sedative). Care of the tube insertion site   Follow instructions from your health care provider about how to take care of your tube insertion site. Make sure you: ? Wash your hands with soap and water before you change your bandage (dressing). If soap and water are not available, use hand sanitizer. ? Change your dressing as told by your health care provider. Be careful not to pull on the tube while removing the dressing. ? When you change the dressing, wash the skin around the tube, rinse well, and pat the skin dry.  Check the tube insertion area every day for signs of infection. Check for: ? More redness, swelling, or pain. ? More fluid or blood. ? Warmth. ? Pus or a bad smell. Care of the nephrostomy tube and drainage bag  Always keep the tubing, the leg bag, or the bedside drainage bags below the level  of the kidney so that your urine drains freely.  When connecting your nephrostomy tube to a drainage bag, make sure that there are no kinks in the tubing and that your urine is draining freely. You may want to use an elastic bandage to wrap any exposed tubing that goes from the nephrostomy tube to any of the connecting tubes.  At night, you may want to connect your nephrostomy tube or the leg bag to a larger bedside drainage bag.  Follow instructions from your health care provider about how to empty or change the drainage bag.  Empty the drainage bag when it becomes ? full.  Replace the drainage bag and any extension tubing that is connected to your nephrostomy tube every 3 weeks or as often as told by your health care provider. Your health care provider will explain how to change the drainage bag and extension tubing. General instructions  Take over-the-counter and prescription medicines only as told by your health care provider.  Keep all follow-up  visits as told by your health care provider. This is important. Contact a health care provider if:  You have problems with any of the valves or tubing.  You have persistent pain or soreness in your back.  You have more redness, swelling, or pain around your tube insertion site.  You have more fluid or blood coming from your tube insertion site.  Your tube insertion site feels warm to the touch.  You have pus or a bad smell coming from your tube insertion site.  You have increased urine output or you feel burning when urinating. Get help right away if:  You have pain in your abdomen during the first week.  You have chest pain or have trouble breathing.  You have a new appearance of blood in your urine.  You have a fever or chills.  You have back pain that is not relieved by your medicine.  You have decreased urine output.  Your nephrostomy tube comes out. This information is not intended to replace advice given to you by your  health care provider. Make sure you discuss any questions you have with your health care provider. Document Revised: 02/28/2017 Document Reviewed: 12/29/2015 Elsevier Patient Education  [place].   Moderate Conscious Sedation, Adult, Care After These instructions provide you with information about caring for yourself after your procedure. Your health care provider may also give you more specific instructions. Your treatment has been planned according to current medical practices, but problems sometimes occur. Call your health care provider if you have any problems or questions after your procedure. What can I expect after the procedure? After your procedure, it is common:  To feel sleepy for several hours.  To feel clumsy and have poor balance for several hours.  To have poor judgment for several hours.  To vomit if you eat too soon. Follow these instructions at home: For at least 24 hours after the procedure:   Do not: ? Participate in activities where you could fall or become injured. ? Drive. ? Use heavy machinery. ? Drink alcohol. ? Take sleeping pills or medicines that cause drowsiness. ? Make important decisions or sign legal documents. ? Take care of children on your own.  Rest. Eating and drinking  Follow the diet recommended by your health care provider.  If you vomit: ? Drink water, juice, or soup when you can drink without vomiting. ? Make sure you have little or no nausea before eating solid foods. General instructions  Have a responsible adult stay with you until you are awake and alert.  Take over-the-counter and prescription medicines only as told by your health care provider.  If you smoke, do not smoke without supervision.  Keep all follow-up visits as told by your health care provider. This is important. Contact a health care provider if:  You keep feeling nauseous or you keep vomiting.  You feel light-headed.  You develop a  rash.  You have a fever. Get help right away if:  You have trouble breathing. This information is not intended to replace advice given to you by your health care provider. Make sure you discuss any questions you have with your health care provider. Document Revised: 02/28/2017 Document Reviewed: 07/08/2015 Elsevier Patient Education  2020 [employer].

## 2020-01-26 NOTE — Progress Notes (Signed)
Patient noted to have a potassium level of 6.6 on today's lab work. Dr. Moshe Cipro with nephrology was consulted and she recommended a dose of 10 mg Lokelma prior to discharge and a prescription at discharge for 10 mg Lokelma daily until the patient's potassium level is within normal range.   The facility were Mr. Sides resides [facility]) was notified of this plan. They requested a paper script be sent with the patient and a prescription was written for a one-week supply. [facility] has the ability to check labs and I requested that a potassium level be checked 01/27/20. The nurse was also notified to contact the patient's PCP or his urologist for further management if necessary.  All of the above information was relayed to short stay who will be discharging the patient back to [facility] around 1:30 today.  Please call IR with any further questions.  Soyla Dryer, [place] [phone] 01/26/2020, 1:17 PM

## 2020-01-26 NOTE — Procedures (Signed)
Interventional Radiology Procedure Note  Procedure: Successful placement of a 37F 24 cm right sided JJ ureteral catheter and replacement of 37F perc nephrostomy tube.   Complications: None  Estimated Blood Loss: None  Recommendations: - Will give lokelma due to hyperkalemia and send back to facility with a 1 week supply of the same.   - Will alert facility he will need nephrology f/u   Signed,  Criselda Peaches, MD

## 2020-01-27 ENCOUNTER — Other Ambulatory Visit: Payer: Self-pay | Admitting: Urology

## 2020-01-27 ENCOUNTER — Other Ambulatory Visit: Payer: Self-pay | Admitting: Gastroenterology

## 2020-02-15 ENCOUNTER — Ambulatory Visit: Payer: Self-pay | Admitting: Surgery

## 2020-02-15 NOTE — Progress Notes (Addendum)
PCP -  Cardiologist -  Neuro- Antony Contras MD  PPM/ICD -  Device Orders -  Rep Notified -   Chest x-ray -  EKG - 12-24-19 epic Stress Test -  ECHO - 10-19-19 epic Cardiac Cath -   Sleep Study -  CPAP -   Fasting Blood Sugar -  Checks Blood Sugar _____ times a day  Blood Thinner Instructions: Aspirin Instructions:  ERAS Protcol - PRE-SURGERY Ensure or G2-   COVID TEST- 02-26-20   Activity--  Anesthesia review: CVA  , CKD IV, hodgkins lymphoma, PTSD  Patient denies shortness of breath, fever, cough and chest pain at PAT appointment   All instructions explained to the patient, with a verbal understanding of the material. Patient agrees to go over the instructions while at home for a better understanding. Patient also instructed to self quarantine after being tested for COVID-19. The opportunity to ask questions was provided.

## 2020-02-15 NOTE — Patient Instructions (Addendum)
DUE TO COVID-19 ONLY ONE VISITOR IS ALLOWED TO COME WITH YOU AND STAY IN THE WAITING ROOM ONLY DURING PRE OP AND PROCEDURE DAY OF SURGERY. THE 1 VISITOR  MAY VISIT WITH YOU AFTER SURGERY IN YOUR PRIVATE ROOM DURING VISITING HOURS ONLY!  YOU NEED TO HAVE A COVID 19 TEST ON__11-27-21 @ 12:00 PM , THIS TEST MUST BE DONE BEFORE SURGERY,  COVID TESTING SITE [address] [place] [id number], IT IS ON THE RIGHT GOING OUT [address] APPROXIMATELY  2 MINUTES PAST [employer] ON THE RIGHT. ONCE YOUR COVID TEST IS COMPLETED,  PLEASE BEGIN THE QUARANTINE INSTRUCTIONS AS OUTLINED IN YOUR HANDOUT.                Chase [name]    Your procedure is scheduled on: 03-01-20   Report to [facility] Main  Entrance   Report to admitting at: 6:30 AM     Call this number if you have problems the morning of surgery [phone]    Remember:   DRINK 2 [place] AT  1000 PM AND 1 PRESURGERY DRINK THE DAY OF THE PROCEDURE 3 HOURS PRIOR TO SCHEDULED SURGERY. NO SOLIDS AFTER MIDNIGHT THE DAY PRIOR TO THE SURGERY. NOTHING BY MOUTH EXCEPT CLEAR LIQUIDS UNTIL THREE HOURS PRIOR TO SCHEDULED SURGERY. PLEASE FINISH PRESURGERY ENSURE DRINK PER SURGEON ORDER 3 HOURS PRIOR TO SCHEDULED SURGERY TIME WHICH NEEDS TO BE COMPLETED AT: 5:30 AM.  CLEAR LIQUID DIET   Foods Allowed                                                                            Foods Excluded  Black Coffee and tea, regular and decaf                             liquids that you cannot  Plain Jell-O any favor except red or purple                                           see through such as: Fruit ices (not with fruit pulp)                                                 milk, soups, orange juice  Iced Popsicles                                                   All solid food Carbonated beverages, regular and diet                                    Cranberry, grape and apple  juices Sports drinks like Gatorade  Lightly seasoned clear broth or consume(fat free) Sugar, honey syrup   _____________________________________________________________________   BRUSH YOUR TEETH MORNING OF SURGERY AND RINSE YOUR MOUTH OUT, NO CHEWING GUM CANDY OR MINTS.     Take these medicines the morning of surgery with A SIP OF WATER: Diflucan,gabapentin,cymbalta.             You may not have any metal on your body including hair pins and              piercings  Do not wear jewelry, lotions, powders or perfumes, deodorant                    Men may shave face and neck.   Do not bring valuables to the hospital. [place].  Contacts, dentures or bridgework may not be worn into surgery.   Patients discharged the day of surgery will not be allowed to drive home. IF YOU ARE HAVING SURGERY AND GOING HOME THE SAME DAY, YOU MUST HAVE AN ADULT TO DRIVE YOU HOME AND BE WITH YOU FOR 24 HOURS. YOU MAY GO HOME BY TAXI OR UBER OR ORTHERWISE, BUT AN ADULT MUST ACCOMPANY YOU HOME AND STAY WITH YOU FOR 24 HOURS.  Name and phone number of your driver:  Special Instructions: N/A              Please read over the following fact sheets you were given: _____________________________________________________________________        [facility] - Preparing for Surgery Before surgery, you can play an important role.  Because skin is not sterile, your skin needs to be as free of germs as possible.  You can reduce the number of germs on your skin by washing with CHG (chlorahexidine gluconate) soap before surgery.  CHG is an antiseptic cleaner which kills germs and bonds with the skin to continue killing germs even after washing. Please DO NOT use if you have an allergy to CHG or antibacterial soaps.  If your skin becomes reddened/irritated stop using the CHG and inform your nurse when you arrive at Short Stay. Do not shave (including legs and underarms) for at  least 48 hours prior to the first CHG shower.  You may shave your face/neck. Please follow these instructions carefully:  1.  Shower with CHG Soap the night before surgery and the  morning of Surgery.  2.  If you choose to wash your hair, wash your hair first as usual with your  normal  shampoo.  3.  After you shampoo, rinse your hair and body thoroughly to remove the  shampoo.                           4.  Use CHG as you would any other liquid soap.  You can apply chg directly  to the skin and wash                       Gently with a scrungie or clean washcloth.  5.  Apply the CHG Soap to your body ONLY FROM THE NECK DOWN.   Do not use on face/ open                           Wound or open sores. Avoid contact with eyes,  ears mouth and genitals (private parts).                       Wash face,  Genitals (private parts) with your normal soap.             6.  Wash thoroughly, paying special attention to the area where your surgery  will be performed.  7.  Thoroughly rinse your body with warm water from the neck down.  8.  DO NOT shower/wash with your normal soap after using and rinsing off  the CHG Soap.                9.  Pat yourself dry with a clean towel.            10.  Wear clean pajamas.            11.  Place clean sheets on your bed the night of your first shower and do not  sleep with pets. Day of Surgery : Do not apply any lotions/deodorants the morning of surgery.  Please wear clean clothes to the hospital/surgery center.  FAILURE TO FOLLOW THESE INSTRUCTIONS MAY RESULT IN THE CANCELLATION OF YOUR SURGERY PATIENT SIGNATURE_________________________________  NURSE SIGNATURE__________________________________  ________________________________________________________________________   Adam Phenix  An incentive spirometer is a tool that can help keep your lungs clear and active. This tool measures how well you are filling your lungs with each breath. Taking long deep breaths  may help reverse or decrease the chance of developing breathing (pulmonary) problems (especially infection) following:  A long period of time when you are unable to move or be active. BEFORE THE PROCEDURE   If the spirometer includes an indicator to show your best effort, your nurse or respiratory therapist will set it to a desired goal.  If possible, sit up straight or lean slightly forward. Try not to slouch.  Hold the incentive spirometer in an upright position. INSTRUCTIONS FOR USE  1. Sit on the edge of your bed if possible, or sit up as far as you can in bed or on a chair. 2. Hold the incentive spirometer in an upright position. 3. Breathe out normally. 4. Place the mouthpiece in your mouth and seal your lips tightly around it. 5. Breathe in slowly and as deeply as possible, raising the piston or the ball toward the top of the column. 6. Hold your breath for 3-5 seconds or for as long as possible. Allow the piston or ball to fall to the bottom of the column. 7. Remove the mouthpiece from your mouth and breathe out normally. 8. Rest for a few seconds and repeat Steps 1 through 7 at least 10 times every 1-2 hours when you are awake. Take your time and take a few normal breaths between deep breaths. 9. The spirometer may include an indicator to show your best effort. Use the indicator as a goal to work toward during each repetition. 10. After each set of 10 deep breaths, practice coughing to be sure your lungs are clear. If you have an incision (the cut made at the time of surgery), support your incision when coughing by placing a pillow or rolled up towels firmly against it. Once you are able to get out of bed, walk around indoors and cough well. You may stop using the incentive spirometer when instructed by your caregiver.  RISKS AND COMPLICATIONS  Take your time so you do not get dizzy or light-headed.  If you are in  pain, you may need to take or ask for pain medication before doing  incentive spirometry. It is harder to take a deep breath if you are having pain. AFTER USE  Rest and breathe slowly and easily.  It can be helpful to keep track of a log of your progress. Your caregiver can provide you with a simple table to help with this. If you are using the spirometer at home, follow these instructions: [place] IF:   You are having difficultly using the spirometer.  You have trouble using the spirometer as often as instructed.  Your pain medication is not giving enough relief while using the spirometer.  You develop fever of 100.5 F (38.1 C) or higher. SEEK IMMEDIATE MEDICAL CARE IF:   You cough up bloody sputum that had not been present before.  You develop fever of 102 F (38.9 C) or greater.  You develop worsening pain at or near the incision site. MAKE SURE YOU:   Understand these instructions.  Will watch your condition.  Will get help right away if you are not doing well or get worse. Document Released: 07/29/2006 Document Revised: 06/10/2011 Document Reviewed: 09/29/2006 [facility] Patient Information 2014 [place], [place].   ________________________________________________________________________

## 2020-02-17 ENCOUNTER — Encounter (HOSPITAL_COMMUNITY): Payer: Self-pay

## 2020-02-17 ENCOUNTER — Ambulatory Visit (HOSPITAL_COMMUNITY): Admit: 2020-02-17 | Payer: No Typology Code available for payment source | Admitting: Gastroenterology

## 2020-02-17 SURGERY — SIGMOIDOSCOPY, FLEXIBLE
Anesthesia: Monitor Anesthesia Care

## 2020-02-18 ENCOUNTER — Encounter (HOSPITAL_COMMUNITY)
Admission: RE | Admit: 2020-02-18 | Discharge: 2020-02-18 | Disposition: A | Discharge status: Home / Self Care | Payer: No Typology Code available for payment source | Source: Ambulatory Visit | Attending: Surgery | Admitting: Surgery

## 2020-02-18 NOTE — Progress Notes (Signed)
Pt. Did not show up for his PST appointment. Several attempts to rich him by phone were unsuccessful. Pt's son was contacted,he said he does not live near by,and he does not know what is going on with his dad today.

## 2020-02-22 NOTE — Progress Notes (Signed)
Spoke with triage nurse at Dr. Clyda Greener office made them aware we have not been able to contact this patient for pre op.

## 2020-02-23 ENCOUNTER — Encounter (HOSPITAL_COMMUNITY): Payer: Self-pay | Admitting: Physician Assistant

## 2020-02-23 NOTE — Progress Notes (Addendum)
Spoke with [name] son Mr. Dewey Viens. Who now lives in [place], states it is hard to get his father to answer the phone.  He has also received several calls from other medical professionals trying to reach Mr. Adarian Bur. And they have also been unsuccessful.  The son said he would also try to reach his father and have him to return my call.

## 2020-02-26 ENCOUNTER — Inpatient Hospital Stay (HOSPITAL_COMMUNITY): Admission: RE | Admit: 2020-02-26 | Payer: No Typology Code available for payment source | Source: Ambulatory Visit

## 2020-02-28 ENCOUNTER — Ambulatory Visit: Payer: No Typology Code available for payment source | Admitting: Internal Medicine

## 2020-02-28 ENCOUNTER — Inpatient Hospital Stay (HOSPITAL_COMMUNITY): Admission: RE | Admit: 2020-02-28 | Payer: No Typology Code available for payment source | Source: Ambulatory Visit

## 2020-02-28 ENCOUNTER — Other Ambulatory Visit (HOSPITAL_COMMUNITY): Payer: No Typology Code available for payment source

## 2020-02-29 ENCOUNTER — Encounter (HOSPITAL_COMMUNITY): Admission: RE | Payer: Self-pay | Source: Home / Self Care

## 2020-02-29 ENCOUNTER — Ambulatory Visit (HOSPITAL_COMMUNITY)
Admission: RE | Admit: 2020-02-29 | Payer: No Typology Code available for payment source | Source: Home / Self Care | Admitting: Gastroenterology

## 2020-02-29 SURGERY — COLONOSCOPY WITH PROPOFOL
Anesthesia: Monitor Anesthesia Care

## 2020-02-29 MED ORDER — BUPIVACAINE LIPOSOME 1.3 % IJ SUSP
20.0000 mL | Freq: Once | INTRAMUSCULAR | Status: DC
  Filled 2020-02-29: qty 20

## 2020-03-03 ENCOUNTER — Encounter: Payer: Self-pay | Admitting: Internal Medicine

## 2020-03-03 ENCOUNTER — Ambulatory Visit (INDEPENDENT_AMBULATORY_CARE_PROVIDER_SITE_OTHER): Payer: No Typology Code available for payment source | Admitting: Internal Medicine

## 2020-03-03 ENCOUNTER — Other Ambulatory Visit: Payer: Self-pay | Admitting: Urology

## 2020-03-03 VITALS — BP 98/62 | HR 81 | Ht 66.0 in | Wt 137.0 lb

## 2020-03-03 DIAGNOSIS — Z01818 Encounter for other preprocedural examination: Secondary | ICD-10-CM | POA: Diagnosis not present

## 2020-03-03 DIAGNOSIS — Z800 Family history of malignant neoplasm of digestive organs: Secondary | ICD-10-CM | POA: Diagnosis not present

## 2020-03-03 DIAGNOSIS — N321 Vesicointestinal fistula: Secondary | ICD-10-CM | POA: Diagnosis not present

## 2020-03-03 MED ORDER — PEG-KCL-NACL-NASULF-NA ASC-C 100 G PO SOLR: 1.0000 | ORAL | 0 refills | Status: DC

## 2020-03-03 NOTE — Patient Instructions (Signed)
You have been scheduled for a colonoscopy. Please follow written instructions given to you at your visit today.  Please pick up your prep supplies at the pharmacy within the next 1-3 days. If you use inhalers (even only as needed), please bring them with you on the day of your procedure.   I appreciate the opportunity to care for you. Carl Gessner, MD, FACG 

## 2020-03-03 NOTE — Progress Notes (Signed)
Chase STANISZEWSKI Sr. 54 y.o. 11/14/1965 [id number]  Assessment & Plan:   Encounter Diagnoses  Name Primary?  . Colovesical fistula Yes  . Pre-operative examination   . Family history of rectal cancer in father, colon cancer in maternal grandmother and paternal uncle     Schedule preoperative/screening colonoscopy for January 13 with Dr. Rush Landmark. This will facilitate only 1 prep prior to his colonoscopy and surgery which is planned for January 14 with [name] and Alinda Money.  The risks and benefits as well as alternatives of endoscopic procedure(s) have been discussed and reviewed. All questions answered. The patient agrees to proceed.   Meds ordered this encounter  Medications  . peg 3350 powder (MOVIPREP) 100 g SOLR    Sig: Take 1 kit (200 g total) by mouth as directed.    Dispense:  1 kit    Refill:  0     I appreciate the opportunity to care for this patient. Copies will be sent to Drs. Annie Main gross and Raynelle Bring Subjective:   Chief Complaint: Colovesical fistula and preoperative colonoscopy  HPI 54 year old widowed white man with a history of gastric bypass, Hodgkin's lymphoma, PTSD, chronic kidney disease and recent problems with colovesical fistula and associated abscess that was drained by IR. There was also associated ureteral obstruction treated with stenting and nephrostomy tubes. Has been in and out of the hospital he had urosepsis issues. There is a plan to resect his sigmoid colon and to repair this colovesical fistula on January 14. He is here to discuss a preoperative colonoscopy. He had seen Dr. Cristina Gong of [facility] but [facility] does not participate with the [facility] healthcare reimbursement.  Patient is feeling better now, he had a stroke this summer he spent time in [place] and is ambulating with a walker and has regained a significant portion of left-sided strength after the rehab. He is living alone independently at this time. He is gaining weight after his  illnesses. He has acid reflux controlled on PPI he is status post gastric bypass procedure many years ago. He did have a colonoscopy probably around the age of 81 through the [place]. We do not have those records. Father had rectal cancer with reported metastases to the liver lungs and pancreas age not determined. Maternal grandmother had colon cancer and a paternal uncle had colon cancer   Review of systems/other current issues are that he has indwelling nephrostomy tubes, compromised renal function.  He had Covid in the summer and had 1 vaccination but had some  Allergies  Allergen Reactions  . Nsaids Other (See Comments)    Bleeding GI Ulceration  . Ciprofloxacin Nausea And Vomiting   Current Meds  Medication Sig  . Cholecalciferol (VITAMIN D3) 20 MCG (800 UNIT) TABS Take 800 Units by mouth daily.  . cyanocobalamin (,VITAMIN B-12,) 1000 MCG/ML injection Inject 1,000 mcg into the muscle every 30 (thirty) days.   . DULoxetine (CYMBALTA) 30 MG capsule Take 30-60 mg by mouth See admin instructions. Take 60 mg in the morning and 30 mg in the evening  . gabapentin (NEURONTIN) 100 MG capsule Take 2 capsules (200 mg total) by mouth daily. (Patient taking differently: Take 800 mg by mouth 3 (three) times daily. )  . hydrOXYzine (ATARAX/VISTARIL) 10 MG tablet Take 1 tablet (10 mg total) by mouth 3 (three) times daily as needed for itching or anxiety. (Patient taking differently: Take 10 mg by mouth 3 (three) times daily as needed for anxiety. )  . Multiple Vitamin (  MULTIVITAMIN WITH MINERALS) TABS tablet Take 1 tablet by mouth daily.  Marland Kitchen oxyCODONE-acetaminophen (PERCOCET/ROXICET) 5-325 MG tablet Take by mouth every 4 (four) hours as needed for severe pain.  . pantoprazole (PROTONIX) 20 MG tablet Take 20 mg by mouth 2 (two) times daily.  . prazosin (MINIPRESS) 1 MG capsule Take 1 mg by mouth at bedtime.  . sevelamer carbonate (RENVELA) 800 MG tablet Take 800 mg by mouth 3 (three) times daily with meals.  .  sodium bicarbonate 650 MG tablet Take 650 mg by mouth 2 (two) times daily.  . tamsulosin (FLOMAX) 0.4 MG CAPS capsule Take 0.4 mg by mouth at bedtime.   . [DISCONTINUED] HYDROcodone-acetaminophen (NORCO/VICODIN) 5-325 MG tablet Take 1-2 tablets by mouth every 4 (four) hours as needed for up to 10 doses for moderate pain or severe pain (1 moderate, 2 severe). (Patient taking differently: Take 2 tablets by mouth every 4 (four) hours as needed for severe pain. )  . [DISCONTINUED] Probiotic Product (BACID PO) Take 2 tablets by mouth 2 (two) times daily. 250 mg each   Past Medical History:  Diagnosis Date  . Cervical radiculopathy    with left sided weakness  . Chemical exposure    service related injury  . Chronic pain   . CKD (chronic kidney disease), stage IV ([place]) 10/17/2019  . GERD (gastroesophageal reflux disease)   . GIB (gastrointestinal bleeding)   . Heart attack ([place])   . History of CVA (cerebrovascular accident) 12/31/2019  . 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 History of stomach ulcers 05/19/2013   Formatting of this note might be different from the original. Attributed to chronic ibuprofen use  . Hodgkin lymphoma, unspecified, unspecified site ([place]) 12/10/2012   Formatting of this note might be different from the original. In remission.  . Hypokalemia   . Hypomagnesemia   . PTSD (post-traumatic stress disorder)   . Renal disorder    CKD  . Stroke ([place])   . Urinary retention    due to bladder outlet obstruction   Past Surgical History:  Procedure Laterality Date  . COLONOSCOPY    . CYSTOSCOPY W/ URETERAL STENT PLACEMENT N/A 11/14/2019   Procedure: CYSTOSCOPY WITH RETROGRADE PYELOGRAM bilateral Wyvonnia Dusky STENT PLACEMENT left fulguration bladder . cystogram;  Surgeon: Raynelle Bring, MD;  Location: [facility];  Service: Urology;  Laterality: N/A;  . CYSTOSCOPY W/ URETERAL STENT PLACEMENT Bilateral 12/25/2019   Procedure: CYSTOSCOPY WITH bilateral  RETROGRADE PYELOGRAM/ leftURETERAL  STENT PLACEMENT;  Surgeon: Lucas Mallow, MD;  Location: [facility];  Service: Urology;  Laterality: Bilateral;  . GASTRIC BYPASS    . HERNIA REPAIR    . IR NEPHROSTOMY PLACEMENT LEFT  12/27/2019  . IR NEPHROSTOMY PLACEMENT RIGHT  11/16/2019  . IR NEPHROSTOMY PLACEMENT RIGHT  12/27/2019  . IR SINUS/FIST TUBE CHK-NON GI  12/03/2019  . IR URETERAL STENT PLACEMENT EXISTING ACCESS RIGHT  12/08/2019  . 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   Review of Systems See HPI. Otherwise negative  Objective:   Physical Exam BP 98/62   Pulse 81   Ht 5' 6"  (1.676 m)   Wt 137 lb (62.1 kg)   BMI 22.11 kg/m   Middle-aged white man appearing somewhat older than stated age. He uses a walker to ambulate without  difficulty in the room. Lungs are clear Heart sounds are normal The abdomen is soft and nontender without organomegaly or mass it is scaphoid Bilateral nephrostomy tubes in place both costovertebral angles. He is alert and oriented x3

## 2020-03-07 ENCOUNTER — Other Ambulatory Visit: Payer: Self-pay | Admitting: Urology

## 2020-03-27 ENCOUNTER — Telehealth (HOSPITAL_COMMUNITY): Payer: Self-pay

## 2020-03-27 ENCOUNTER — Other Ambulatory Visit (HOSPITAL_COMMUNITY): Payer: Self-pay | Admitting: Adult Health

## 2020-03-27 DIAGNOSIS — N1330 Unspecified hydronephrosis: Secondary | ICD-10-CM

## 2020-03-27 NOTE — Telephone Encounter (Signed)
Called to schedule nephrostomy tube replacement, no answer, left vm. AW

## 2020-03-31 ENCOUNTER — Inpatient Hospital Stay (HOSPITAL_COMMUNITY)
Admission: EM | Admit: 2020-03-31 | Discharge: 2020-05-03 | DRG: 853 | Disposition: A | Discharge status: Home Health | Payer: No Typology Code available for payment source | Attending: Internal Medicine | Admitting: Internal Medicine

## 2020-03-31 ENCOUNTER — Emergency Department (HOSPITAL_COMMUNITY): Payer: No Typology Code available for payment source

## 2020-03-31 ENCOUNTER — Encounter (HOSPITAL_COMMUNITY): Payer: Self-pay

## 2020-03-31 ENCOUNTER — Other Ambulatory Visit: Payer: Self-pay

## 2020-03-31 DIAGNOSIS — Z79891 Long term (current) use of opiate analgesic: Secondary | ICD-10-CM

## 2020-03-31 DIAGNOSIS — R64 Cachexia: Secondary | ICD-10-CM | POA: Diagnosis present

## 2020-03-31 DIAGNOSIS — Z9884 Bariatric surgery status: Secondary | ICD-10-CM

## 2020-03-31 DIAGNOSIS — Z8249 Family history of ischemic heart disease and other diseases of the circulatory system: Secondary | ICD-10-CM

## 2020-03-31 DIAGNOSIS — N189 Chronic kidney disease, unspecified: Secondary | ICD-10-CM | POA: Diagnosis present

## 2020-03-31 DIAGNOSIS — E43 Unspecified severe protein-calorie malnutrition: Secondary | ICD-10-CM | POA: Diagnosis present

## 2020-03-31 DIAGNOSIS — I447 Left bundle-branch block, unspecified: Secondary | ICD-10-CM | POA: Diagnosis present

## 2020-03-31 DIAGNOSIS — I4891 Unspecified atrial fibrillation: Secondary | ICD-10-CM | POA: Diagnosis not present

## 2020-03-31 DIAGNOSIS — K219 Gastro-esophageal reflux disease without esophagitis: Secondary | ICD-10-CM | POA: Diagnosis not present

## 2020-03-31 DIAGNOSIS — K5720 Diverticulitis of large intestine with perforation and abscess without bleeding: Secondary | ICD-10-CM | POA: Diagnosis present

## 2020-03-31 DIAGNOSIS — Z20822 Contact with and (suspected) exposure to covid-19: Secondary | ICD-10-CM | POA: Diagnosis not present

## 2020-03-31 DIAGNOSIS — E872 Acidosis, unspecified: Secondary | ICD-10-CM | POA: Diagnosis present

## 2020-03-31 DIAGNOSIS — G928 Other toxic encephalopathy: Secondary | ICD-10-CM | POA: Diagnosis present

## 2020-03-31 DIAGNOSIS — G894 Chronic pain syndrome: Secondary | ICD-10-CM | POA: Diagnosis not present

## 2020-03-31 DIAGNOSIS — I252 Old myocardial infarction: Secondary | ICD-10-CM

## 2020-03-31 DIAGNOSIS — N321 Vesicointestinal fistula: Secondary | ICD-10-CM | POA: Diagnosis present

## 2020-03-31 DIAGNOSIS — N170 Acute kidney failure with tubular necrosis: Secondary | ICD-10-CM | POA: Diagnosis present

## 2020-03-31 DIAGNOSIS — R680 Hypothermia, not associated with low environmental temperature: Secondary | ICD-10-CM | POA: Diagnosis not present

## 2020-03-31 DIAGNOSIS — E860 Dehydration: Secondary | ICD-10-CM | POA: Diagnosis present

## 2020-03-31 DIAGNOSIS — R531 Weakness: Secondary | ICD-10-CM | POA: Diagnosis not present

## 2020-03-31 DIAGNOSIS — R410 Disorientation, unspecified: Secondary | ICD-10-CM

## 2020-03-31 DIAGNOSIS — R627 Adult failure to thrive: Secondary | ICD-10-CM | POA: Diagnosis not present

## 2020-03-31 DIAGNOSIS — Z8673 Personal history of transient ischemic attack (TIA), and cerebral infarction without residual deficits: Secondary | ICD-10-CM | POA: Diagnosis not present

## 2020-03-31 DIAGNOSIS — Z933 Colostomy status: Secondary | ICD-10-CM | POA: Diagnosis not present

## 2020-03-31 DIAGNOSIS — J9601 Acute respiratory failure with hypoxia: Secondary | ICD-10-CM | POA: Diagnosis not present

## 2020-03-31 DIAGNOSIS — E875 Hyperkalemia: Secondary | ICD-10-CM | POA: Diagnosis not present

## 2020-03-31 DIAGNOSIS — E876 Hypokalemia: Secondary | ICD-10-CM | POA: Diagnosis not present

## 2020-03-31 DIAGNOSIS — N320 Bladder-neck obstruction: Secondary | ICD-10-CM | POA: Diagnosis present

## 2020-03-31 DIAGNOSIS — Z681 Body mass index (BMI) 19 or less, adult: Secondary | ICD-10-CM | POA: Diagnosis not present

## 2020-03-31 DIAGNOSIS — K651 Peritoneal abscess: Secondary | ICD-10-CM | POA: Diagnosis present

## 2020-03-31 DIAGNOSIS — N136 Pyonephrosis: Secondary | ICD-10-CM | POA: Diagnosis not present

## 2020-03-31 DIAGNOSIS — Z8616 Personal history of COVID-19: Secondary | ICD-10-CM | POA: Diagnosis not present

## 2020-03-31 DIAGNOSIS — Z7189 Other specified counseling: Secondary | ICD-10-CM | POA: Diagnosis not present

## 2020-03-31 DIAGNOSIS — R54 Age-related physical debility: Secondary | ICD-10-CM | POA: Diagnosis present

## 2020-03-31 DIAGNOSIS — Z79899 Other long term (current) drug therapy: Secondary | ICD-10-CM

## 2020-03-31 DIAGNOSIS — M5412 Radiculopathy, cervical region: Secondary | ICD-10-CM | POA: Diagnosis present

## 2020-03-31 DIAGNOSIS — G8929 Other chronic pain: Secondary | ICD-10-CM | POA: Diagnosis not present

## 2020-03-31 DIAGNOSIS — F609 Personality disorder, unspecified: Secondary | ICD-10-CM | POA: Diagnosis present

## 2020-03-31 DIAGNOSIS — A419 Sepsis, unspecified organism: Secondary | ICD-10-CM | POA: Diagnosis not present

## 2020-03-31 DIAGNOSIS — N184 Chronic kidney disease, stage 4 (severe): Secondary | ICD-10-CM | POA: Diagnosis present

## 2020-03-31 DIAGNOSIS — D696 Thrombocytopenia, unspecified: Secondary | ICD-10-CM | POA: Diagnosis not present

## 2020-03-31 DIAGNOSIS — F17290 Nicotine dependence, other tobacco product, uncomplicated: Secondary | ICD-10-CM | POA: Diagnosis present

## 2020-03-31 DIAGNOSIS — R0603 Acute respiratory distress: Secondary | ICD-10-CM

## 2020-03-31 DIAGNOSIS — F1190 Opioid use, unspecified, uncomplicated: Secondary | ICD-10-CM | POA: Diagnosis present

## 2020-03-31 DIAGNOSIS — N179 Acute kidney failure, unspecified: Secondary | ICD-10-CM | POA: Diagnosis not present

## 2020-03-31 DIAGNOSIS — N401 Enlarged prostate with lower urinary tract symptoms: Secondary | ICD-10-CM | POA: Diagnosis not present

## 2020-03-31 DIAGNOSIS — Z8051 Family history of malignant neoplasm of kidney: Secondary | ICD-10-CM

## 2020-03-31 DIAGNOSIS — Z881 Allergy status to other antibiotic agents status: Secondary | ICD-10-CM

## 2020-03-31 DIAGNOSIS — N138 Other obstructive and reflux uropathy: Secondary | ICD-10-CM | POA: Diagnosis not present

## 2020-03-31 DIAGNOSIS — K432 Incisional hernia without obstruction or gangrene: Secondary | ICD-10-CM | POA: Diagnosis present

## 2020-03-31 DIAGNOSIS — N135 Crossing vessel and stricture of ureter without hydronephrosis: Secondary | ICD-10-CM | POA: Diagnosis present

## 2020-03-31 DIAGNOSIS — C8114 Nodular sclerosis classical Hodgkin lymphoma, lymph nodes of axilla and upper limb: Secondary | ICD-10-CM

## 2020-03-31 DIAGNOSIS — Z66 Do not resuscitate: Secondary | ICD-10-CM | POA: Diagnosis not present

## 2020-03-31 DIAGNOSIS — D631 Anemia in chronic kidney disease: Secondary | ICD-10-CM | POA: Diagnosis present

## 2020-03-31 DIAGNOSIS — Z800 Family history of malignant neoplasm of digestive organs: Secondary | ICD-10-CM

## 2020-03-31 DIAGNOSIS — K5732 Diverticulitis of large intestine without perforation or abscess without bleeding: Secondary | ICD-10-CM

## 2020-03-31 DIAGNOSIS — L89152 Pressure ulcer of sacral region, stage 2: Secondary | ICD-10-CM | POA: Diagnosis present

## 2020-03-31 DIAGNOSIS — Z936 Other artificial openings of urinary tract status: Secondary | ICD-10-CM

## 2020-03-31 DIAGNOSIS — C8190 Hodgkin lymphoma, unspecified, unspecified site: Secondary | ICD-10-CM | POA: Diagnosis present

## 2020-03-31 DIAGNOSIS — R5381 Other malaise: Secondary | ICD-10-CM

## 2020-03-31 DIAGNOSIS — N185 Chronic kidney disease, stage 5: Secondary | ICD-10-CM | POA: Diagnosis present

## 2020-03-31 DIAGNOSIS — Z886 Allergy status to analgesic agent status: Secondary | ICD-10-CM

## 2020-03-31 DIAGNOSIS — Z515 Encounter for palliative care: Secondary | ICD-10-CM

## 2020-03-31 DIAGNOSIS — F419 Anxiety disorder, unspecified: Secondary | ICD-10-CM | POA: Diagnosis not present

## 2020-03-31 DIAGNOSIS — D510 Vitamin B12 deficiency anemia due to intrinsic factor deficiency: Secondary | ICD-10-CM | POA: Diagnosis present

## 2020-03-31 DIAGNOSIS — R4182 Altered mental status, unspecified: Secondary | ICD-10-CM | POA: Diagnosis not present

## 2020-03-31 DIAGNOSIS — Z8711 Personal history of peptic ulcer disease: Secondary | ICD-10-CM

## 2020-03-31 DIAGNOSIS — R6520 Severe sepsis without septic shock: Secondary | ICD-10-CM | POA: Diagnosis present

## 2020-03-31 DIAGNOSIS — R933 Abnormal findings on diagnostic imaging of other parts of digestive tract: Secondary | ICD-10-CM | POA: Diagnosis not present

## 2020-03-31 DIAGNOSIS — T17908A Unspecified foreign body in respiratory tract, part unspecified causing other injury, initial encounter: Secondary | ICD-10-CM

## 2020-03-31 DIAGNOSIS — F4310 Post-traumatic stress disorder, unspecified: Secondary | ICD-10-CM | POA: Diagnosis present

## 2020-03-31 DIAGNOSIS — F32A Depression, unspecified: Secondary | ICD-10-CM | POA: Diagnosis present

## 2020-03-31 DIAGNOSIS — Z923 Personal history of irradiation: Secondary | ICD-10-CM

## 2020-03-31 DIAGNOSIS — N390 Urinary tract infection, site not specified: Secondary | ICD-10-CM

## 2020-03-31 DIAGNOSIS — Z9889 Other specified postprocedural states: Secondary | ICD-10-CM

## 2020-03-31 LAB — URINALYSIS, ROUTINE W REFLEX MICROSCOPIC
Bacteria, UA: NONE SEEN
Bilirubin Urine: NEGATIVE
Glucose, UA: NEGATIVE mg/dL
Ketones, ur: 5 mg/dL — AB
Nitrite: NEGATIVE
Protein, ur: >=300 mg/dL — AB
RBC / HPF: >50 RBC/hpf — ABNORMAL HIGH (ref 0–5)
Specific Gravity, Urine: 1.013 (ref 1.005–1.030)
WBC, UA: >50 WBC/hpf — ABNORMAL HIGH (ref 0–5)
pH: 5 (ref 5.0–8.0)

## 2020-03-31 LAB — COMPREHENSIVE METABOLIC PANEL
ALT: 25 U/L (ref 0–44)
AST: 38 U/L (ref 15–41)
Albumin: 3.4 g/dL — ABNORMAL LOW (ref 3.5–5.0)
Alkaline Phosphatase: 146 U/L — ABNORMAL HIGH (ref 38–126)
BUN: 136 mg/dL — ABNORMAL HIGH (ref 6–20)
CO2: <7 mmol/L — ABNORMAL LOW (ref 22–32)
Calcium: 7.9 mg/dL — ABNORMAL LOW (ref 8.9–10.3)
Chloride: 108 mmol/L (ref 98–111)
Creatinine, Ser: 16.69 mg/dL — ABNORMAL HIGH (ref 0.61–1.24)
GFR, Estimated: 3 mL/min — ABNORMAL LOW (ref >60)
Glucose, Bld: 126 mg/dL — ABNORMAL HIGH (ref 70–99)
Potassium: 5.1 mmol/L (ref 3.5–5.1)
Sodium: 133 mmol/L — ABNORMAL LOW (ref 135–145)
Total Bilirubin: 0.9 mg/dL (ref 0.3–1.2)
Total Protein: 8 g/dL (ref 6.5–8.1)

## 2020-03-31 LAB — CBC WITH DIFFERENTIAL/PLATELET
Abs Immature Granulocytes: 0.26 10*3/uL — ABNORMAL HIGH (ref 0.00–0.07)
Basophils Absolute: 0 10*3/uL (ref 0.0–0.1)
Basophils Relative: 0 %
Eosinophils Absolute: 0 10*3/uL (ref 0.0–0.5)
Eosinophils Relative: 0 %
HCT: 24.7 % — ABNORMAL LOW (ref 39.0–52.0)
Hemoglobin: 8.3 g/dL — ABNORMAL LOW (ref 13.0–17.0)
Immature Granulocytes: 2 %
Lymphocytes Relative: 3 %
Lymphs Abs: 0.5 10*3/uL — ABNORMAL LOW (ref 0.7–4.0)
MCH: 31 pg (ref 26.0–34.0)
MCHC: 33.6 g/dL (ref 30.0–36.0)
MCV: 92.2 fL (ref 80.0–100.0)
Monocytes Absolute: 0.7 10*3/uL (ref 0.1–1.0)
Monocytes Relative: 4 %
Neutro Abs: 15.1 10*3/uL — ABNORMAL HIGH (ref 1.7–7.7)
Neutrophils Relative %: 91 %
Platelets: 262 10*3/uL (ref 150–400)
RBC: 2.68 MIL/uL — ABNORMAL LOW (ref 4.22–5.81)
RDW: 15.7 % — ABNORMAL HIGH (ref 11.5–15.5)
WBC: 16.6 10*3/uL — ABNORMAL HIGH (ref 4.0–10.5)
nRBC: 0.7 % — ABNORMAL HIGH (ref 0.0–0.2)

## 2020-03-31 LAB — LACTIC ACID, PLASMA
Lactic Acid, Venous: 0.9 mmol/L (ref 0.5–1.9)
Lactic Acid, Venous: 0.6 mmol/L (ref 0.5–1.9)

## 2020-03-31 LAB — RESP PANEL BY RT-PCR (FLU A&B, COVID) ARPGX2
Influenza A by PCR: NEGATIVE
Influenza B by PCR: NEGATIVE
SARS Coronavirus 2 by RT PCR: NEGATIVE

## 2020-03-31 LAB — SODIUM, URINE, RANDOM: Sodium, Ur: 98 mmol/L

## 2020-03-31 LAB — CREATININE, URINE, RANDOM: Creatinine, Urine: 62.18 mg/dL

## 2020-03-31 LAB — APTT: aPTT: 37 seconds — ABNORMAL HIGH (ref 24–36)

## 2020-03-31 LAB — PROTIME-INR
INR: 1.5 — ABNORMAL HIGH (ref 0.8–1.2)
Prothrombin Time: 17.2 seconds — ABNORMAL HIGH (ref 11.4–15.2)

## 2020-03-31 MED ORDER — LACTATED RINGERS IV BOLUS (SEPSIS)
500.0000 mL | Freq: Once | INTRAVENOUS | Status: AC
  Administered 2020-03-31: 500 mL via INTRAVENOUS

## 2020-03-31 MED ORDER — SODIUM BICARBONATE 8.4 % IV SOLN
INTRAVENOUS | Status: AC
  Filled 2020-03-31: qty 100

## 2020-03-31 MED ORDER — LACTATED RINGERS IV SOLN: INTRAVENOUS | Status: DC

## 2020-03-31 MED ORDER — ACETAMINOPHEN 650 MG RE SUPP
650.0000 mg | Freq: Four times a day (QID) | RECTAL | Status: DC | PRN
  Administered 2020-04-02 – 2020-04-03 (×2): 650 mg via RECTAL
  Filled 2020-03-31 (×2): qty 1

## 2020-03-31 MED ORDER — STERILE WATER FOR INJECTION IV SOLN
INTRAVENOUS | Status: DC
  Filled 2020-03-31 (×3): qty 850

## 2020-03-31 MED ORDER — ONDANSETRON HCL 4 MG/2ML IJ SOLN
4.0000 mg | Freq: Four times a day (QID) | INTRAMUSCULAR | Status: DC | PRN
  Administered 2020-04-06 – 2020-04-08 (×2): 4 mg via INTRAVENOUS
  Filled 2020-03-31 (×2): qty 2

## 2020-03-31 MED ORDER — SODIUM BICARBONATE 8.4 % IV SOLN
INTRAVENOUS | Status: AC
  Administered 2020-03-31: 150 meq via INTRAVENOUS
  Filled 2020-03-31: qty 50

## 2020-03-31 MED ORDER — PANTOPRAZOLE SODIUM 40 MG IV SOLR
40.0000 mg | INTRAVENOUS | Status: DC
  Administered 2020-03-31 – 2020-04-03 (×4): 40 mg via INTRAVENOUS
  Filled 2020-03-31 (×4): qty 40

## 2020-03-31 MED ORDER — HEPARIN SODIUM (PORCINE) 5000 UNIT/ML IJ SOLN
5000.0000 [IU] | Freq: Three times a day (TID) | INTRAMUSCULAR | Status: DC
  Administered 2020-04-02 – 2020-05-03 (×71): 5000 [IU] via SUBCUTANEOUS
  Filled 2020-03-31 (×79): qty 1

## 2020-03-31 MED ORDER — ONDANSETRON HCL 4 MG PO TABS: 4.0000 mg | ORAL_TABLET | Freq: Four times a day (QID) | ORAL | Status: DC | PRN

## 2020-03-31 MED ORDER — SODIUM CHLORIDE 0.9 % IV SOLN
2.0000 g | Freq: Once | INTRAVENOUS | Status: AC
  Administered 2020-03-31: 2 g via INTRAVENOUS
  Filled 2020-03-31: qty 2

## 2020-03-31 MED ORDER — SODIUM CHLORIDE 0.9 % IV SOLN
1.0000 g | INTRAVENOUS | Status: DC
  Administered 2020-04-01 – 2020-04-02 (×2): 1 g via INTRAVENOUS
  Filled 2020-03-31 (×5): qty 1

## 2020-03-31 MED ORDER — LACTATED RINGERS IV BOLUS (SEPSIS)
1000.0000 mL | Freq: Once | INTRAVENOUS | Status: AC
  Administered 2020-03-31: 1000 mL via INTRAVENOUS

## 2020-03-31 MED ORDER — ACETAMINOPHEN 325 MG PO TABS
650.0000 mg | ORAL_TABLET | Freq: Four times a day (QID) | ORAL | Status: DC | PRN
  Administered 2020-04-08 – 2020-04-12 (×3): 650 mg via ORAL
  Filled 2020-03-31 (×3): qty 2

## 2020-03-31 MED ORDER — LACTATED RINGERS IV BOLUS (SEPSIS)
250.0000 mL | Freq: Once | INTRAVENOUS | Status: AC
  Administered 2020-03-31: 250 mL via INTRAVENOUS

## 2020-03-31 MED ORDER — SODIUM BICARBONATE 8.4 % IV SOLN: 150.0000 meq | Freq: Once | INTRAVENOUS | Status: AC

## 2020-03-31 NOTE — ED Triage Notes (Signed)
Pt. Came via EMS from home. Neighbor states pt. Was found on the floor this afternoon. Neighbor states that pt. Was in their recliner yesterday when they last checked on pt. Pt. Had kidney drainage bags pulled out upon arrival. Pt. Is lethargic. Pt. Does complain of their lower back hurting.

## 2020-03-31 NOTE — ED Provider Notes (Signed)
  Physical Exam  BP (!) 107/58   Pulse 90   Temp 99.1 F (37.3 C)   Resp 13   Ht 5\' 6"  (1.676 m)   Wt 53.3 kg   SpO2 98%   BMI 18.95 kg/m   Physical Exam  ED Course/Procedures   Clinical Course as of 03/31/20 2116  Fri Mar 31, 3524  [id number] 54 year old male here from home by ambulance for altered mental status.  Has bilateral nephrostomy tubes which do not appear to be functioning.  Cachectic.  Hypothermic.  Getting sepsis work-up and will need admission to the hospital. [MB]    Clinical Course User Index [MB] Hayden Rasmussen, MD    Procedures  MDM  Transferred from [facility] for admission.  Potential sepsis.  Bilateral hydronephrosis.  Nephrostomy tubes are no longer functioning in or out.  Temperature has improved.  However new renal failure.  Reassuring potassium but acidotic.  Nephrology will see patient.  Sent for admission.  On my exam still minimally responsive.      Davonna Belling, MD 03/31/20 2116

## 2020-03-31 NOTE — ED Notes (Addendum)
Call to Chase [name] [phone]  He is asked regarding woman, Chase [name] who identifies as wife  He reports his father is not married, that he is a widower whose spouse dies in 07/01/14  [name] request that no information be shared save with him or his brother [name]  Registration informed   Immediately after speaking w [name], brother [name] called and requested update. He is given update as well as pt is to be transferred ER to ER   And encouraged to call brother [name]

## 2020-03-31 NOTE — ED Notes (Signed)
Report to [place], Karen Chafe

## 2020-03-31 NOTE — ED Notes (Signed)
Estill Cotta hugger placed on pt.

## 2020-03-31 NOTE — ED Provider Notes (Signed)
[facility] EMERGENCY DEPARTMENT Provider Note   CSN: [id number] Arrival date & time: 03/31/20  1333     History Chief Complaint  Patient presents with  . Altered Mental Status    Chase [name]. is a 54 y.o. male.  The history is provided by medical records and the EMS personnel. No language interpreter was used.  Altered Mental Status    54 year old male significant history of Hodgkin lymphoma, CKD, CVA, chronic opioid use, brought here via EMS for evaluation of altered mental status.  History is limited due to patient's altered mental status state.  Per EMS note, patient came from home.  Neighbor states patient was found on the floor this afternoon.  Neighbor states that patient was seen sitting in his recliner yesterday when he was last checked.  Note patient had a nephrostomy bag that was pulled out upon arrival.  Patient only complaining of low back pain.  Level 5 caveat is due to altered mental status.  Past Medical History:  Diagnosis Date  . Cervical radiculopathy    with left sided weakness  . Chemical exposure    service related injury  . Chronic pain   . CKD (chronic kidney disease), stage IV ([place]) 10/17/2019  . COVID-19   . GERD (gastroesophageal reflux disease)   . GIB (gastrointestinal bleeding)   . Heart attack ([place])   . History of CVA (cerebrovascular accident) 12/31/2019  . 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 History of stomach ulcers 05/19/2013   Formatting of this note might be different from the original. Attributed to chronic ibuprofen use  . Hodgkin lymphoma, unspecified, unspecified site ([place]) 12/10/2012   Formatting of this note might be different from the original. In remission.  . Hypokalemia   . Hypomagnesemia   . PTSD (post-traumatic stress disorder)   . Renal disorder    CKD  . Stroke ([place])   . Urinary retention    due to bladder outlet obstruction    Patient Active Problem List   Diagnosis Date Noted  . Incisional hernia -  epigastric 01/04/2020  . Chronic narcotic use 01/02/2020  . Anemia in chronic kidney disease 01/02/2020  . ATN (acute tubular necrosis) ([place]) 12/31/2019  . Candida UTI 12/31/2019  . Electrolyte abnormality 12/31/2019  . Increased anion gap metabolic acidosis 87/68/1157  . Microcytic anemia 12/31/2019  . ABLA (acute blood loss anemia) 12/31/2019  . Anxiety and depression 12/31/2019  . History of CVA (cerebrovascular accident) 12/31/2019  . BMI 24.0-24.9, adult 12/31/2019  . Bilateral ureteral obstruction s/p perc nephrosotmy & stenting 12/30/2019  . AKI (acute kidney injury) ([place]) 12/24/2019  . 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 Pelvic abscess with colovescial fistula 11/15/2019  . E. coli UTI (urinary tract infection) 11/15/2019  . PTSD (post-traumatic stress disorder)   . Chemical exposure   . Cervical radiculopathy   . Septic shock ([place]) 11/08/2019  . Protein-calorie malnutrition, severe 10/23/2019  . Cerebral embolism with cerebral infarction 10/18/2019  . Acute left-sided weakness 10/17/2019  . CKD (chronic kidney disease), stage IV ([place]) 10/17/2019  . Chronic pain 10/17/2019  . Fall 10/17/2019  . Pseudomonas urinary tract infection 08/24/2019  . BPH (benign prostatic hyperplasia) 08/24/2019  . Acute renal failure superimposed on stage 4 chronic kidney disease ([place]) 08/24/2019  . Anemia 08/24/2019  . Dyspnea 08/24/2019  . History of COVID-19 08/24/2019  . History of elevated PSA 08/24/2019  . 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 Hyponatremia 08/24/2019  . Metabolic acidosis 26/20/3559  .  Skin lesion of neck 08/24/2019  . Toxic metabolic encephalopathy 79/15/0569  . Cervical spondylosis without myelopathy 06/19/2016  . Neck pain 09/04/2015  . History of stomach ulcers 05/19/2013  . Male erectile dysfunction 05/19/2013  . Hodgkin lymphoma, unspecified, unspecified site ([place]) 12/10/2012  . Iron deficiency anemia 12/10/2012  . Pernicious anemia 12/10/2012  . Arthralgia  of multiple sites 04/20/2012  . Postlaminectomy syndrome, not elsewhere classified 04/20/2012    Past Surgical History:  Procedure Laterality Date  . COLONOSCOPY    . CYSTOSCOPY W/ URETERAL STENT PLACEMENT N/A 11/14/2019   Procedure: CYSTOSCOPY WITH RETROGRADE PYELOGRAM bilateral Wyvonnia Dusky STENT PLACEMENT left fulguration bladder . cystogram;  Surgeon: Raynelle Bring, MD;  Location: [facility];  Service: Urology;  Laterality: N/A;  . CYSTOSCOPY W/ URETERAL STENT PLACEMENT Bilateral 12/25/2019   Procedure: CYSTOSCOPY WITH bilateral  RETROGRADE PYELOGRAM/ leftURETERAL STENT PLACEMENT;  Surgeon: Lucas Mallow, MD;  Location: [facility];  Service: Urology;  Laterality: Bilateral;  . GASTRIC BYPASS    . HERNIA REPAIR    . IR NEPHROSTOMY PLACEMENT LEFT  12/27/2019  . IR NEPHROSTOMY PLACEMENT RIGHT  11/16/2019  . IR NEPHROSTOMY PLACEMENT RIGHT  12/27/2019  . IR SINUS/FIST TUBE CHK-NON GI  12/03/2019  . IR URETERAL STENT PLACEMENT EXISTING ACCESS RIGHT  12/08/2019  . IR URETERAL STENT PLACEMENT EXISTING ACCESS RIGHT  01/26/2020       Family History  Problem Relation Age of Onset  . Renal cancer Father        mets to liver and lungs and pancreas  . Heart disease Father   . Heart disease Paternal Grandfather   . Colon cancer Maternal Grandmother   . Colon cancer Paternal Uncle   . Stomach cancer Neg Hx   . Esophageal cancer Neg Hx   . Pancreatic cancer Neg Hx     Social History   Tobacco Use  . Smoking status: Never Smoker  . Smokeless tobacco: Current User  Vaping Use  . Vaping Use: Never used  Substance Use Topics  . Alcohol use: Never  . Drug use: Never    Home Medications Prior to Admission medications   Medication Sig Start Date End Date Taking? Authorizing Provider  Cholecalciferol (VITAMIN D3) 20 MCG (800 UNIT) TABS Take 800 Units by mouth daily.    [provider]  cyanocobalamin (,VITAMIN B-12,) 1000 MCG/ML injection Inject 1,000 mcg into the muscle every 30 (thirty)  days.  12/28/13   [provider]  DULoxetine (CYMBALTA) 30 MG capsule Take 30-60 mg by mouth See admin instructions. Take 60 mg in the morning and 30 mg in the evening    [provider]  gabapentin (NEURONTIN) 100 MG capsule Take 2 capsules (200 mg total) by mouth daily. Patient taking differently: Take 800 mg by mouth 3 (three) times daily.  01/09/20 03/03/20  Elodia Florence., MD  hydrOXYzine (ATARAX/VISTARIL) 10 MG tablet Take 1 tablet (10 mg total) by mouth 3 (three) times daily as needed for itching or anxiety. Patient taking differently: Take 10 mg by mouth 3 (three) times daily as needed for anxiety.  10/26/19   Cristal Ford, DO  Multiple Vitamin (MULTIVITAMIN WITH MINERALS) TABS tablet Take 1 tablet by mouth daily. 10/27/19   Cristal Ford, DO  oxyCODONE-acetaminophen (PERCOCET/ROXICET) 5-325 MG tablet Take by mouth every 4 (four) hours as needed for severe pain.    [provider]  pantoprazole (PROTONIX) 20 MG tablet Take 20 mg by mouth 2 (two) times daily.  [provider]  peg 3350 powder (MOVIPREP) 100 g SOLR Take 1 kit (200 g total) by mouth as directed. 03/03/20   Gatha Mayer, MD  prazosin (MINIPRESS) 1 MG capsule Take 1 mg by mouth at bedtime.    [provider]  sevelamer carbonate (RENVELA) 800 MG tablet Take 800 mg by mouth 3 (three) times daily with meals.    [provider]  sodium bicarbonate 650 MG tablet Take 650 mg by mouth 2 (two) times daily.    [provider]  tamsulosin (FLOMAX) 0.4 MG CAPS capsule Take 0.4 mg by mouth at bedtime.     [provider]    Allergies    Nsaids and Ciprofloxacin  Review of Systems   Review of Systems  Unable to perform ROS: Mental status change    Physical Exam Updated Vital Signs BP 129/64   Pulse 82   Temp (!) 93.4 F (34.1 C) (Axillary)   Resp 14   Ht 5' 6" (1.676 m)   Wt 53.3 kg   SpO2 99%   BMI 18.95 kg/m   Physical Exam Vitals  and nursing note reviewed. Exam conducted with a chaperone present.  Constitutional:      Appearance: He is well-developed and well-nourished.     Comments: Patient appears chronically ill, emaciated, and altered unable to answer questions appropriately.  He does appear to be in some mild discomfort.  HENT:     Head: Atraumatic.     Mouth/Throat:     Mouth: Mucous membranes are dry.  Eyes:     Conjunctiva/sclera: Conjunctivae normal.     Pupils: Pupils are equal, round, and reactive to light.  Cardiovascular:     Rate and Rhythm: Normal rate and regular rhythm.     Pulses: Normal pulses.     Heart sounds: Normal heart sounds.  Pulmonary:     Effort: Pulmonary effort is normal.     Breath sounds: Normal breath sounds. No wheezing, rhonchi or rales.  Abdominal:     Palpations: Abdomen is soft.     Tenderness: There is no abdominal tenderness.  Genitourinary:    Comments: Indwelling Foley catheter in place however it is not attached to any Foley bag.  No signs of trauma Musculoskeletal:     Cervical back: Neck supple.     Comments: Poor effort and movement of all 4 extremities.  Skin:    Findings: No rash.          Comments: Right nephrostomy tube is missing, skin irritation noted at the nephrostomy site.  Left nephrostomy tube with surrounding skin erythema and irritation at the ostomy site, but JP drain is in place.  Surrounding dressings are soiled.  Neurological:     Mental Status: He is lethargic and disoriented.     GCS: GCS eye subscore is 3. GCS verbal subscore is 4. GCS motor subscore is 5.     Motor: Weakness present.  Psychiatric:        Mood and Affect: Mood and affect normal.     ED Results / Procedures / Treatments   Labs (all labs ordered are listed, but only abnormal results are displayed) Labs Reviewed  COMPREHENSIVE METABOLIC PANEL - Abnormal; Notable for the following components:      Result Value   Sodium 133 (*)    CO2 <7 (*)    Glucose, Bld 126 (*)     BUN 136 (*)    Creatinine, Ser 16.69 (*)  Calcium 7.9 (*)    Albumin 3.4 (*)    Alkaline Phosphatase 146 (*)    GFR, Estimated 3 (*)    All other components within normal limits  CBC WITH DIFFERENTIAL/PLATELET - Abnormal; Notable for the following components:   WBC 16.6 (*)    RBC 2.68 (*)    Hemoglobin 8.3 (*)    HCT 24.7 (*)    RDW 15.7 (*)    nRBC 0.7 (*)    Neutro Abs 15.1 (*)    Lymphs Abs 0.5 (*)    Abs Immature Granulocytes 0.26 (*)    All other components within normal limits  PROTIME-INR - Abnormal; Notable for the following components:   Prothrombin Time 17.2 (*)    INR 1.5 (*)    All other components within normal limits  APTT - Abnormal; Notable for the following components:   aPTT 37 (*)    All other components within normal limits  CULTURE, BLOOD (ROUTINE X 2)  CULTURE, BLOOD (ROUTINE X 2)  RESP PANEL BY RT-PCR (FLU A&B, COVID) ARPGX2  URINE CULTURE  LACTIC ACID, PLASMA  LACTIC ACID, PLASMA  URINALYSIS, ROUTINE W REFLEX MICROSCOPIC    EKG None  ED ECG REPORT   Date: 03/31/2020  Rate: 84  Rhythm: normal sinus rhythm  QRS Axis: normal  Intervals: normal  ST/T Wave abnormalities: normal  Conduction Disutrbances:none  Narrative Interpretation:   Old EKG Reviewed: unchanged  I have personally reviewed the EKG tracing and agree with the computerized printout as noted.   Radiology DG Chest Port 1 View  Result Date: 03/31/2020 CLINICAL DATA:  Altered mental status, questionable sepsis, pending COVID test. EXAM: PORTABLE CHEST 1 VIEW COMPARISON:  Chest x-ray dated 11/08/2019. FINDINGS: Heart size and mediastinal contours are within normal limits. Lungs appear clear. No pleural effusion or pneumothorax is seen. Osseous structures about the chest are unremarkable. IMPRESSION: No active disease. No evidence of pneumonia. Electronically Signed   By: Franki Cabot M.D.   On: 03/31/2020 14:36   CT Renal Stone Study  Result Date: 03/31/2020 CLINICAL  DATA:  Flank pain EXAM: CT ABDOMEN AND PELVIS WITHOUT CONTRAST TECHNIQUE: Multidetector CT imaging of the abdomen and pelvis was performed following the standard protocol without IV contrast. COMPARISON:  12/30/2019, 01/26/2020, 11/14/2019, 12/24/2019 FINDINGS: Lower chest: Lung bases demonstrate no acute consolidation or pleural effusion. Normal cardiac size. Hepatobiliary: Status post cholecystectomy. No focal hepatic abnormality. No biliary dilatation. Pancreas: Unremarkable. No pancreatic ductal dilatation or surrounding inflammatory changes. Spleen: Normal in size without focal abnormality. Adrenals/Urinary Tract: Adrenal glands within normal limits. Bilateral percutaneous nephrostomy catheters has been removed. Linear soft tissue tract is visible posteriorly. Bilateral ureteral stents are visualized, proximal pigtail on the right is within extrarenal pelvis, distal pigtail within the posterior bladder. Proximal pigtail of left ureteral stent is visible in the proximal left ureter, the distal portion of the stent is in the urinary bladder. Moderate bilateral hydronephrosis which is increased compared to the previous CT. Multiple small foci of gas within the bilateral renal collecting systems which may be due to refluxed air from the bladder versus recent removal of percutaneous nephrostomy tubes. Moderate perinephric fat stranding. Foley catheter within the bladder. Small air within the urinary bladder which appears slightly thick-walled. Stomach/Bowel: The stomach is nonenlarged. Evidence of prior gastric bypass. No dilated small bowel. Large amount of stool in the colon. Negative appendix. No acute bowel wall thickening. Vascular/Lymphatic: Mild aortic atherosclerosis without aneurysm. No suspicious nodes. Reproductive: Slightly enlarged prostate Other: No  free air or free fluid. Left trans gluteal percutaneous drainage catheter with pigtail visible in the left posterior pelvis. No significant fluid  collections visible within the pelvis at this time. Musculoskeletal: No acute or suspicious osseous abnormality. IMPRESSION: 1. Bilateral percutaneous nephrostomy catheters have been removed. Interim finding of moderate bilateral hydronephrosis of extrarenal pelvises, increased compared to prior CT. Right ureteral stent in expected position. Proximal pigtail of the left ureteral stent is visible within the proximal left ureter. Foley catheter present within decompressed urinary bladder; bladder wall appears slightly thickened. 2. Left trans gluteal percutaneous drainage catheter with pigtail visible in the left posterior pelvis. No significant fluid collections visible within the pelvis at this time. 3. Large amount of stool in the colon, suggesting constipation. Aortic Atherosclerosis (ICD10-I70.0). Electronically Signed   By: Donavan Foil M.D.   On: 03/31/2020 16:32    Procedures .Critical Care Performed by: Domenic Moras, PA-C Authorized by: Domenic Moras, PA-C   Critical care provider statement:    Critical care time (minutes):  65   Critical care was time spent personally by me on the following activities:  Discussions with consultants, evaluation of patient's response to treatment, examination of patient, ordering and performing treatments and interventions, ordering and review of laboratory studies, ordering and review of radiographic studies, pulse oximetry, re-evaluation of patient's condition, obtaining history from patient or surrogate and review of old charts   (including critical care time)  Medications Ordered in ED Medications  lactated ringers infusion ( Intravenous New Bag/Given 03/31/20 1512)  ceFEPIme (MAXIPIME) 1 g in sodium chloride 0.9 % 100 mL IVPB (has no administration in time range)  sodium bicarbonate injection 150 mEq (has no administration in time range)  sodium bicarbonate 1 mEq/mL injection (has no administration in time range)  lactated ringers bolus 1,000 mL (0 mLs  Intravenous Stopped 03/31/20 1652)    And  lactated ringers bolus 500 mL (0 mLs Intravenous Stopping Infusion hung by another clincian 03/31/20 1522)    And  lactated ringers bolus 250 mL (250 mLs Intravenous New Bag/Given 03/31/20 1513)  ceFEPIme (MAXIPIME) 2 g in sodium chloride 0.9 % 100 mL IVPB (0 g Intravenous Stopped 03/31/20 1516)    ED Course  I have reviewed the triage vital signs and the nursing notes.  Pertinent labs & imaging results that were available during my care of the patient were reviewed by me and considered in my medical decision making (see chart for details).  Clinical Course as of 03/31/20 1418  Fri Mar 31, 5538  3331 53 year old male here from home by ambulance for altered mental status.  Has bilateral nephrostomy tubes which do not appear to be functioning.  Cachectic.  Hypothermic.  Getting sepsis work-up and will need admission to the hospital. [MB]    Clinical Course User Index [MB] Hayden Rasmussen, MD   MDM Rules/Calculators/A&P                          BP 129/64   Pulse 82   Temp (!) 93.4 F (34.1 C) (Axillary)   Resp 14   Ht 5' 6" (1.676 m)   Wt 53.3 kg   SpO2 99%   BMI 18.95 kg/m   Final Clinical Impression(s) / ED Diagnoses Final diagnoses:  Acute kidney failure ([place])  Sepsis with acute renal failure without septic shock, due to unspecified organism, unspecified acute renal failure type [facility])  Delirium    Rx / DC Orders ED Discharge  Orders    None     2:15 PM Patient brought here due to altered mental status.  He is cachectic, appears lethargic, chronically ill, and is experiencing some back discomfort.  It appears his right nephrostomy tube is missing.  He has history of Hodgkin lymphoma as well as recent problem with colovesicular fistula with associated abscess that was drained by IR.  He has ureteral obstruction treated with stenting and nephrostomy tube.  He has developed urosepsis multiple times required hospitalization.  He  lives at home with a home health aide but appears unkept.  He has pressure ulcers in his sacral region  He has a rectal temp of 92.3, initiate code sepsis, patient placed on Bair hugger, IV fluid initiated along with antibiotic for suspected urosepsis source.  Care discussed with Dr. Melina Copa.   4:29 PM Labs remarkable for evidence of AKI including BUN 136, creatinine 16.69.  This is markedly elevated from previous value of approximately a month ago.  Elevated white count of 16.6 but normal lactic acid.  Hemoglobin is 8.3 near his baseline.  Covid test is negative.  Chest x-ray unremarkable.  I have consulted with on-call nephrologist, Dr. Janan Halter, who recommend urology consultation for nephrostomy tube replacement and for medicine for admission.  He will also put patient on the list for potential dialysis.  I did request IR for consultation about IR nephrostomy tube placement, but was made aware radiology does not perform that procedure at [facility].    5:48 PM I did call pt's son [name] [phone].   At this time pt have not produced any urine.  Multiple attempts to consult urology without hearing any call back.  We will continue to try. Pt will be started on sodium bicarb 125mq at 150cc/hr per recommendation of nephrologist.  [facility]  Awaits consultation from hospitalist.  Pt also currently receiving lactated ringer bolus per sepsis protocol.  Sepsis reassessment done.   6:42 PM I did reconsulted hospitalist Dr. SNehemiah Settlewho recommend pt to be transfer ER to ER for hospitalist admission as there are no progressive bed available at this time.  [facility]vino Chapelwho agrees to accept pt.  [facility] I also discussed care with Dr. NKathrynn Humble  PHerbie SaxonSr. was evaluated in Emergency Department on 03/31/2020 for the symptoms described in the history of present illness. He  was evaluated in the context of the global COVID-19 pandemic, which necessitated consideration that the patient might be at risk for infection with the SARS-CoV-2 virus that causes COVID-19. Institutional protocols and algorithms that pertain to the evaluation of patients at risk for COVID-19 are in a state of rapid change based on information released by regulatory bodies including the CDC and federal and state organizations. These policies and algorithms were followed during the patient's care in the ED.    TDomenic Moras PA-C 03/31/20 1856    BHayden Rasmussen MD 03/31/20 [phone]

## 2020-03-31 NOTE — ED Notes (Signed)
Nephrology paged at this time also Gali Spinney

## 2020-03-31 NOTE — ED Notes (Signed)
Call from eldest son,   Gilkey   [phone]  He would appreciate being called first  As he is eldest son  And emergency contact per him

## 2020-03-31 NOTE — ED Notes (Signed)
Pt is obtunded  Opens eyes only to voice and touch   Non verbal   Warming blanket on   Awaiting plan of care and have asked for PA to notify this nurse and pt should pt need to be transferred to higher level of care

## 2020-03-31 NOTE — ED Notes (Signed)
Son called   Permission to transfer given   Request of PA to relay plan of care that we might get carelink and bed control involved

## 2020-03-31 NOTE — ED Notes (Signed)
Woman identifying self as "his wife" Chase [name] called for update   Transferred to Mr Chase [name]

## 2020-03-31 NOTE — Progress Notes (Signed)
Pharmacy Antibiotic Note Malique Driskill. is a 54 y.o. male CKD patient admitted on 03/31/2020 with UTI.  Pharmacy has been consulted for cefepime dosing. He has ureteral obstruction treated with stenting /nephrostomy tube and has developed urosepsis multiple times requiring  hospitalization.    Plan: cefepime 1g IV q24h Pharmacy to monitor labs, cultures and patient progress.   Height: 5\' 6"  (167.6 cm) Weight: 53.3 kg (117 lb 6.4 oz) IBW/kg (Calculated) : 63.8  Temp (24hrs), Avg:92.8 F (33.8 C), Min:92.8 F (33.8 C), Max:92.8 F (33.8 C)  Recent Labs  Lab 03/31/20 1420  WBC 16.6*  CREATININE 16.69*  LATICACIDVEN 0.9    Estimated Creatinine Clearance: 3.7 mL/min (A) (by C-G formula based on SCr of 16.69 mg/dL (H)).    Allergies  Allergen Reactions  . Nsaids Other (See Comments)    Bleeding GI Ulceration  . Ciprofloxacin Nausea And Vomiting    Antimicrobials this admission: cefepime 12/31 >>     Dose adjustments this admission: cefepime  Microbiology results: 12/31  [facility] x2:  12/31 Resp PCR: SARS CoV-2 negative; Flu A/B negative    Thank you for allowing pharmacy to be a part of this patient's care.  Despina Pole 03/31/2020 3:38 PM

## 2020-03-31 NOTE — H&P (Signed)
History and Physical    Chase [name] SWF:[id number] DOB: 08/13/1965 DOA: 03/31/2020  PCP: [facility], [name]   Patient coming from: Home   Chief Complaint: Obtunded, nephrostomy tubes out   HPI: Chase [name]. is a 54 y.o. 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  Patient was reportedly seen by neighbors yesterday in a recliner and was then found on the floor today obtunded.  He was sent to [facility] emergency department and is unable to provide history due to his clinical condition.  [facility] ED Course: Upon arrival to the ED, patient is found to be hypothermic, saturating well on room air, and with stable blood pressure.  EKG features artifact with undetermined rhythm.  Chest x-ray negative for acute findings.  Chemistry panel concerning for undetectably low bicarbonate, BUN 136, and creatinine 16.69, up from 5.76 in October.  CBC features a leukocytosis 16,600.  Lactic acid is normal.  INR 1.5.  CT stone study notable for interim removal of bilateral percutaneous nephrostomy tubes and bilateral hydronephrosis, ureteral stents, left transgluteal drain without significant fluid collection, and large colonic stool burden.  Blood and urine cultures were collected in the emergency department, warming blankets applied, 30 cc/kg LR bolus given, and cefepime was started.  Nephrology was consulted by the ED physician.  Patient was transferred to [facility] ED for admission.  Review of  Systems:  Unable to complete ROS secondary to to the patient's clinical condition.  Past Medical History:  Diagnosis Date  . Cervical radiculopathy    with left sided weakness  . Chemical exposure    service related injury  . Chronic pain   . CKD (chronic kidney disease), stage IV ([place]) 10/17/2019  . COVID-19   . GERD (gastroesophageal reflux disease)   . GIB (gastrointestinal bleeding)   . Heart attack ([place])   . History of CVA (cerebrovascular accident) 12/31/2019  . 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 History of stomach ulcers 05/19/2013   Formatting of this note might be different from the original. Attributed to chronic ibuprofen use  . Hodgkin lymphoma, unspecified, unspecified site ([place]) 12/10/2012   Formatting of this note might be different from the original. In remission.  . Hypokalemia   . Hypomagnesemia   . PTSD (post-traumatic stress disorder)   . Renal disorder    CKD  . Stroke ([place])   . Urinary retention    due to bladder outlet obstruction    Past Surgical History:  Procedure Laterality Date  . COLONOSCOPY    . CYSTOSCOPY W/ URETERAL STENT PLACEMENT N/A 11/14/2019   Procedure: CYSTOSCOPY WITH RETROGRADE PYELOGRAM bilateral Wyvonnia Dusky STENT PLACEMENT left fulguration bladder . cystogram;  Surgeon: Raynelle Bring, MD;  Location: [facility] ORS;  Service: Urology;  Laterality: N/A;  . CYSTOSCOPY W/ URETERAL STENT PLACEMENT Bilateral 12/25/2019   Procedure: CYSTOSCOPY WITH bilateral  RETROGRADE PYELOGRAM/ leftURETERAL STENT PLACEMENT;  Surgeon: Lucas Mallow, MD;  Location: [facility];  Service: Urology;  Laterality: Bilateral;  . GASTRIC BYPASS    . HERNIA REPAIR    . IR NEPHROSTOMY  PLACEMENT LEFT  12/27/2019  . IR NEPHROSTOMY PLACEMENT RIGHT  11/16/2019  . IR NEPHROSTOMY PLACEMENT RIGHT  12/27/2019  . IR SINUS/FIST TUBE CHK-NON GI  12/03/2019  . IR URETERAL STENT PLACEMENT EXISTING ACCESS RIGHT  12/08/2019  . IR URETERAL STENT PLACEMENT EXISTING ACCESS RIGHT  01/26/2020     Social History:   reports that he has never smoked. He uses smokeless tobacco. He reports that he does not drink alcohol and does not use drugs.  Allergies  Allergen Reactions  . Nsaids Other (See Comments)    Bleeding GI Ulceration  . Ciprofloxacin Nausea And Vomiting    Family History  Problem Relation Age of Onset  . Renal cancer Father        mets to liver and lungs and pancreas  . Heart disease Father   . Heart disease Paternal Grandfather   . Colon cancer Maternal Grandmother   . Colon cancer Paternal Uncle   . Stomach cancer Neg Hx   . Esophageal cancer Neg Hx   . Pancreatic cancer Neg Hx      Prior to Admission medications   Medication Sig Start Date End Date Taking? Authorizing Provider  Cholecalciferol (VITAMIN D3) 20 MCG (800 UNIT) TABS Take 800 Units by mouth daily.    [provider]  cyanocobalamin (,VITAMIN B-12,) 1000 MCG/ML injection Inject 1,000 mcg into the muscle every 30 (thirty) days.  12/28/13   [provider]  DULoxetine (CYMBALTA) 30 MG capsule Take 30-60 mg by mouth See admin instructions. Take 60 mg in the morning and 30 mg in the evening    [provider]  gabapentin (NEURONTIN) 100 MG capsule Take 2 capsules (200 mg total) by mouth daily. Patient taking differently: Take 800 mg by mouth 3 (three) times daily.  01/09/20 03/03/20  Elodia Florence., MD  hydrOXYzine (ATARAX/VISTARIL) 10 MG tablet Take 1 tablet (10 mg total) by mouth 3 (three) times daily as needed for itching or anxiety. Patient taking differently: Take 10 mg by mouth 3 (three) times daily as needed for anxiety.  10/26/19   Cristal Ford, DO  Multiple Vitamin (MULTIVITAMIN WITH MINERALS) TABS tablet Take 1 tablet by mouth daily. 10/27/19   Cristal Ford, DO  oxyCODONE-acetaminophen (PERCOCET/ROXICET) 5-325 MG tablet Take by mouth every 4 (four) hours as needed for severe pain.    [provider]  pantoprazole (PROTONIX) 20 MG tablet Take  20 mg by mouth 2 (two) times daily.    [provider]  peg 3350 powder (MOVIPREP) 100 g SOLR Take 1 kit (200 g total) by mouth as directed. 03/03/20   Gatha Mayer, MD  prazosin (MINIPRESS) 1 MG capsule Take 1 mg by mouth at bedtime.    [provider]  sevelamer carbonate (RENVELA) 800 MG tablet Take 800 mg by mouth 3 (three) times daily with meals.    [provider]  sodium bicarbonate 650 MG tablet Take 650 mg by mouth 2 (two) times daily.    [provider]  tamsulosin (FLOMAX) 0.4 MG CAPS capsule Take 0.4 mg by mouth at bedtime.     [provider]    Physical Exam: Vitals:   03/31/20 1856 03/31/20 2000 03/31/20 2008 03/31/20 2122  BP:  (!) 110/58 (!) 107/58   Pulse:  90    Resp:  14 13   Temp: 98.7 F (37.1 C)  99.1 F (37.3 C) 98.2 F (36.8 C)  TempSrc: Rectal   Rectal  SpO2:  98% 98%  Weight:      Height:        Constitutional: No apparent respiratory distress, obtunded, cachectic  Eyes: PERTLA, lids and conjunctivae normal ENMT: Mucous membranes are moist. Posterior pharynx clear of any exudate or lesions.   Neck: supple, no masses, no thyromegaly Respiratory: clear to auscultation bilaterally, no wheezing, no crackles. No accessory muscle use.  Cardiovascular: S1 & S2 heard, regular rate and rhythm. No extremity edema.   Abdomen: No distension, no tenderness, soft. Bowel sounds active.  Musculoskeletal: no clubbing / cyanosis. No joint deformity upper and lower extremities.   Skin: left gluteal drain noted. Strings noted from nephrostomy tube site at left flank. Warm, dry, well-perfused. Neurologic: No gross facial asymmetry, PERRL. Moves all extremities. Localizing response to pain.  Psychiatric: Obtunded.     Labs and Imaging on Admission: I have personally reviewed following labs and imaging studies  CBC: Recent Labs  Lab 03/31/20 1420  WBC 16.6*  NEUTROABS 15.1*  HGB 8.3*  HCT 24.7*  MCV 92.2  PLT 751    Basic Metabolic Panel: Recent Labs  Lab 03/31/20 1420  NA 133*  K 5.1  CL 108  CO2 <7*  GLUCOSE 126*  BUN 136*  CREATININE 16.69*  CALCIUM 7.9*   GFR: Estimated Creatinine Clearance: 3.7 mL/min (A) (by C-G formula based on SCr of 16.69 mg/dL (H)). Liver Function Tests: Recent Labs  Lab 03/31/20 1420  AST 38  ALT 25  ALKPHOS 146*  BILITOT 0.9  PROT 8.0  ALBUMIN 3.4*   No results for input(s): LIPASE, AMYLASE in the last 168 hours. No results for input(s): AMMONIA in the last 168 hours. Coagulation Profile: Recent Labs  Lab 03/31/20 1420  INR 1.5*   Cardiac Enzymes: No results for input(s): CKTOTAL, CKMB, CKMBINDEX, TROPONINI in the last 168 hours. BNP (last 3 results) No results for input(s): PROBNP in the last 8760 hours. HbA1C: No results for input(s): HGBA1C in the last 72 hours. CBG: No results for input(s): GLUCAP in the last 168 hours. Lipid Profile: No results for input(s): CHOL, HDL, LDLCALC, TRIG, CHOLHDL, LDLDIRECT in the last 72 hours. Thyroid Function Tests: No results for input(s): TSH, T4TOTAL, FREET4, T3FREE, THYROIDAB in the last 72 hours. Anemia Panel: No results for input(s): VITAMINB12, FOLATE, FERRITIN, TIBC, IRON, RETICCTPCT in the last 72 hours. Urine analysis:    Component Value Date/Time   COLORURINE YELLOW 03/31/2020 1407   APPEARANCEUR CLOUDY (A) 03/31/2020 1407   LABSPEC 1.013 03/31/2020 1407   PHURINE 5.0 03/31/2020 1407   GLUCOSEU NEGATIVE 03/31/2020 1407   HGBUR MODERATE (A) 03/31/2020 1407   BILIRUBINUR NEGATIVE 03/31/2020 1407   KETONESUR 5 (A) 03/31/2020 1407   PROTEINUR >=300 (A) 03/31/2020 1407   NITRITE NEGATIVE 03/31/2020 1407   LEUKOCYTESUR LARGE (A) 03/31/2020 1407   Sepsis Labs: @LABRCNTIP (procalcitonin:4,lacticidven:4) ) Recent Results (from the past 240 hour(s))  Blood Culture (routine x 2)     Status: None (Preliminary result)   Collection Time: 03/31/20  2:20 PM   Specimen: Blood  Result Value Ref  Range Status   Specimen Description BLOOD BLOOD LEFT FOREARM  Final   Special Requests   Final    BOTTLES DRAWN AEROBIC AND ANAEROBIC Blood Culture adequate volume Performed at [facility], [address] [place], [place] [address]    Culture PENDING  Incomplete   Report Status PENDING  Incomplete  Blood Culture (routine x 2)     Status: None (Preliminary result)   Collection Time: 03/31/20  2:20 PM  Specimen: Blood  Result Value Ref Range Status   Specimen Description BLOOD LEFT ANTECUBITAL  Final   Special Requests   Final    BOTTLES DRAWN AEROBIC AND ANAEROBIC Blood Culture adequate volume Performed at [facility], [address] [place], [place] [address]    Culture PENDING  Incomplete   Report Status PENDING  Incomplete  Resp Panel by RT-PCR (Flu A&B, Covid) Nasopharyngeal Swab     Status: None   Collection Time: 03/31/20  2:48 PM   Specimen: Nasopharyngeal Swab; Nasopharyngeal(NP) swabs in vial transport medium  Result Value Ref Range Status   SARS Coronavirus 2 by RT PCR NEGATIVE NEGATIVE Final    Comment: (NOTE) SARS-CoV-2 target nucleic acids are NOT DETECTED.  The SARS-CoV-2 RNA is generally detectable in upper respiratory specimens during the acute phase of infection. The lowest concentration of SARS-CoV-2 viral copies this assay can detect is 138 copies/mL. A negative result does not preclude SARS-Cov-2 infection and should not be used as the sole basis for treatment or other patient management decisions. A negative result may occur with  improper specimen collection/handling, submission of specimen other than nasopharyngeal swab, presence of viral mutation(s) within the areas targeted by this assay, and inadequate number of viral copies(<138 copies/mL). A negative result must be combined with clinical observations, patient history, and epidemiological information. The expected result is Negative.  Fact Sheet for Patients:   EntrepreneurPulse.com.au  Fact Sheet for Healthcare Providers:  IncredibleEmployment.be  This test is no t yet approved or cleared by the [place] FDA and  has been authorized for detection and/or diagnosis of SARS-CoV-2 by FDA under an Emergency Use Authorization (EUA). This EUA will remain  in effect (meaning this test can be used) for the duration of the COVID-19 declaration under Section 564(b)(1) of the Act, 21 U.S.C.section 360bbb-3(b)(1), unless the authorization is terminated  or revoked sooner.       Influenza A by PCR NEGATIVE NEGATIVE Final   Influenza B by PCR NEGATIVE NEGATIVE Final    Comment: (NOTE) The Xpert Xpress SARS-CoV-2/FLU/RSV plus assay is intended as an aid in the diagnosis of influenza from Nasopharyngeal swab specimens and should not be used as a sole basis for treatment. Nasal washings and aspirates are unacceptable for Xpert Xpress SARS-CoV-2/FLU/RSV testing.  Fact Sheet for Patients: EntrepreneurPulse.com.au  Fact Sheet for Healthcare Providers: IncredibleEmployment.be  This test is not yet approved or cleared by the [place] FDA and has been authorized for detection and/or diagnosis of SARS-CoV-2 by FDA under an Emergency Use Authorization (EUA). This EUA will remain in effect (meaning this test can be used) for the duration of the COVID-19 declaration under Section 564(b)(1) of the Act, 21 U.S.C. section 360bbb-3(b)(1), unless the authorization is terminated or revoked.  Performed at [facility], [address] [place], [place] [address]      Radiological Exams on Admission: DG Chest [facility] 1 View  Result Date: 03/31/2020 CLINICAL DATA:  Altered mental status, questionable sepsis, pending COVID test. EXAM: PORTABLE CHEST 1 VIEW COMPARISON:  Chest x-ray dated 11/08/2019. FINDINGS: Heart size and mediastinal contours are within normal limits. Lungs appear clear.  No pleural effusion or pneumothorax is seen. Osseous structures about the chest are unremarkable. IMPRESSION: No active disease. No evidence of pneumonia. Electronically Signed   By: Franki Cabot M.D.   On: 03/31/2020 14:36   CT Renal Stone Study  Result Date: 03/31/2020 CLINICAL DATA:  Flank pain EXAM: CT ABDOMEN AND PELVIS WITHOUT CONTRAST TECHNIQUE: Multidetector CT imaging  of the abdomen and pelvis was performed following the standard protocol without IV contrast. COMPARISON:  12/30/2019, 01/26/2020, 11/14/2019, 12/24/2019 FINDINGS: Lower chest: Lung bases demonstrate no acute consolidation or pleural effusion. Normal cardiac size. Hepatobiliary: Status post cholecystectomy. No focal hepatic abnormality. No biliary dilatation. Pancreas: Unremarkable. No pancreatic ductal dilatation or surrounding inflammatory changes. Spleen: Normal in size without focal abnormality. Adrenals/Urinary Tract: Adrenal glands within normal limits. Bilateral percutaneous nephrostomy catheters has been removed. Linear soft tissue tract is visible posteriorly. Bilateral ureteral stents are visualized, proximal pigtail on the right is within extrarenal pelvis, distal pigtail within the posterior bladder. Proximal pigtail of left ureteral stent is visible in the proximal left ureter, the distal portion of the stent is in the urinary bladder. Moderate bilateral hydronephrosis which is increased compared to the previous CT. Multiple small foci of gas within the bilateral renal collecting systems which may be due to refluxed air from the bladder versus recent removal of percutaneous nephrostomy tubes. Moderate perinephric fat stranding. Foley catheter within the bladder. Small air within the urinary bladder which appears slightly thick-walled. Stomach/Bowel: The stomach is nonenlarged. Evidence of prior gastric bypass. No dilated small bowel. Large amount of stool in the colon. Negative appendix. No acute bowel wall thickening.  Vascular/Lymphatic: Mild aortic atherosclerosis without aneurysm. No suspicious nodes. Reproductive: Slightly enlarged prostate Other: No free air or free fluid. Left trans gluteal percutaneous drainage catheter with pigtail visible in the left posterior pelvis. No significant fluid collections visible within the pelvis at this time. Musculoskeletal: No acute or suspicious osseous abnormality. IMPRESSION: 1. Bilateral percutaneous nephrostomy catheters have been removed. Interim finding of moderate bilateral hydronephrosis of extrarenal pelvises, increased compared to prior CT. Right ureteral stent in expected position. Proximal pigtail of the left ureteral stent is visible within the proximal left ureter. Foley catheter present within decompressed urinary bladder; bladder wall appears slightly thickened. 2. Left trans gluteal percutaneous drainage catheter with pigtail visible in the left posterior pelvis. No significant fluid collections visible within the pelvis at this time. 3. Large amount of stool in the colon, suggesting constipation. Aortic Atherosclerosis (ICD10-I70.0). Electronically Signed   By: Donavan Foil M.D.   On: 03/31/2020 16:32    EKG: Independently reviewed. Artifact, undetermined rhythm.   Assessment/Plan   1. Acute renal failure superimposed on CKD IV; metabolic acidosis; hydronephrosis   - Patient with hx of CKD IV (possibly progressed to CKD V based on labs from October), bilateral ureteral stenosis with ureteral stents and bilateral nephrostomy tubes which were noted to be out on arrival presents obtunded and found to have BUN 136, SCr 16.69, and serum bicarb <7  - Nephrology consulting and much appreciated  - Patient has been fluid-resuscitated with LR and now starting isotonic bicarbonate infusion  - IR consulted for nephrostomy tubes  - Continue isotonic bicarb infusion, renally-dose medications, avoid nephrotoxins, strict I/Os, daily chem panel    2. Severe sepsis  secondary to UTI  - Presents with acute encephalopathy and is found to be hypothermic with leukocytosis, normal lactate, stable BP  - CXR clear, no abscess on CT abd/pelvis, no meningismus, likely recurrent UTI  - Blood and urine cultures were collected in ED, 30 cc/kg LR bolus was given, and cefepime started  - Continue cefepime, follow cultures and clinical course    3. Colovesical fistula  - He has left transgluteal drain in place for associated abscess with no significant fluid-collection seen on CT in ED  - He is planned for colonoscopy and surgery  January 2022    4. Chronic pain  - Does not appear to be in pain, hold sedating medications for now    5. History of CVA  - Ischemic CVA in July 2021 was suspected to be embolic, he reportedly had hx of AF but was not AC candidate at that time due to hx of GIB and thrombocytopenia  - EKG in ED read as "atrial fibrillation" but appears to be sinus with significant artifact, he is in sinus rhythm on monitor  - Had been on aspirin previously, pharmacy med-rec is currently pending   6. PTSD  - Hold sedating medications for now    7. Acute encephalopathy  - Patient obtunded on arrival, likely secondary to sepsis and/or uremia  - Continue antibiotics and IVF hydration, check blood gas     8. Normocytic anemia  - Hgb is 8.3 on admission, similar to recent admission, no apparent bleeding  - Monitor  9. History of gastric bypass  - Gastric bypass in 1996 for morbid obesity, had anastomotic ulcer in 2014  - Continue PPI    10. History Hodgkin lymphoma  - Treated with ABVD and radiation in 2010-2011    DVT prophylaxis: sq heparin  Code Status: Full  Family Communication: Son updated from ED  Disposition Plan:  Patient is from: Home  Anticipated d/c is to: TBD Anticipated d/c date is: 04/04/20 Patient currently: Pending nephrostomy tube placement, improvement in renal function, treatment of sepsis  Consults called: Nephrology consulted  by ED physician  Admission status: Inpatient    Vianne Bulls, MD [facility]  03/31/2020, 9:59 PM

## 2020-03-31 NOTE — ED Notes (Signed)
Unable to get response from Urology on call   Tim, RN, [facility] attempting to find and assist w transfer

## 2020-03-31 NOTE — ED Notes (Signed)
Pt arrived from [facility] via [place]. Roomed in ED 21

## 2020-03-31 NOTE — ED Notes (Signed)
Chase [name], left # so that he can get updates about Pt. [phone]

## 2020-03-31 NOTE — ED Notes (Signed)
Consulted bed placement they have same pager number paged again at this time as well as leaving a voice mail on cell phone Dr. Tammi Klippel. Chase [name]

## 2020-03-31 NOTE — ED Notes (Signed)
Attempt to call son Ferman Basilio [phone]  Message left

## 2020-03-31 NOTE — Treatment Plan (Addendum)
Notified by [facility] about patients arrival.  21 man w/ h/o gastric bypass, hodgkin's lymphoma, colovesical fistula, bilateral ureteral stenosis w/ ckd and hydronephrosis who was found down by his neighbor at home and brought to the ER.  He was found to be in renal failure with Creatinine of 17 up from a baseline of 2.5-3 (although recent AKI and left hospital around 5-6 in October). He has leukocytosis and is hypothermic. His nephrostomy tubes have been removed and he has bilateral hydronephrosis on CT today. He has minimal hyperkalemia but severe acidosis. Likely uremia.  His AKI is likely from dehydration and hydronephrosis. Given hypothermia and leukocytosis he likely has urosepsis. He should undergo bilateral replacement of nephrostomy tubes. If he were felt to be to unstable to undergo the procedure we could dialyze him before but I worry about delaying this procedure given the concern for sepsis.  Dialysis could be consider but his candidacy for long term dialysis is poor given his significant comorbid conditions. I would like to pursue more conservative mgmt with fluids and nephrostomy tubes before starting dialysis. Hopefully his mental state will improve and we can have conversations about whether he would want/be able to do dialysis.  For his acidosis and AKI he should receive nephrostomy tubes, as above, but in the mean time would provide IV na bicarb 168meq infusion at least at 150cc/hr. Lactated ringer bolus also likely helpful but defer to examining provider. Obtain VBG and lactate as well. Unclear anion gap  We will eval in person once patient arrives at [facility]

## 2020-04-01 ENCOUNTER — Inpatient Hospital Stay (HOSPITAL_COMMUNITY): Payer: No Typology Code available for payment source

## 2020-04-01 DIAGNOSIS — E872 Acidosis: Secondary | ICD-10-CM

## 2020-04-01 DIAGNOSIS — Z7189 Other specified counseling: Secondary | ICD-10-CM

## 2020-04-01 DIAGNOSIS — N185 Chronic kidney disease, stage 5: Secondary | ICD-10-CM

## 2020-04-01 DIAGNOSIS — C8114 Nodular sclerosis classical Hodgkin lymphoma, lymph nodes of axilla and upper limb: Secondary | ICD-10-CM

## 2020-04-01 DIAGNOSIS — A419 Sepsis, unspecified organism: Secondary | ICD-10-CM | POA: Diagnosis not present

## 2020-04-01 DIAGNOSIS — N179 Acute kidney failure, unspecified: Secondary | ICD-10-CM | POA: Diagnosis not present

## 2020-04-01 DIAGNOSIS — G928 Other toxic encephalopathy: Secondary | ICD-10-CM

## 2020-04-01 DIAGNOSIS — Z515 Encounter for palliative care: Secondary | ICD-10-CM

## 2020-04-01 DIAGNOSIS — N135 Crossing vessel and stricture of ureter without hydronephrosis: Secondary | ICD-10-CM | POA: Diagnosis not present

## 2020-04-01 HISTORY — PX: IR FLUORO GUIDE CV LINE RIGHT: IMG2283

## 2020-04-01 HISTORY — PX: IR US GUIDE VASC ACCESS RIGHT: IMG2390

## 2020-04-01 HISTORY — PX: IR NEPHROSTOMY PLACEMENT RIGHT: IMG6064

## 2020-04-01 HISTORY — PX: IR NEPHROSTOMY PLACEMENT LEFT: IMG6063

## 2020-04-01 LAB — CBC
HCT: 19.8 % — ABNORMAL LOW (ref 39.0–52.0)
Hemoglobin: 7 g/dL — ABNORMAL LOW (ref 13.0–17.0)
MCH: 31.4 pg (ref 26.0–34.0)
MCHC: 35.4 g/dL (ref 30.0–36.0)
MCV: 88.8 fL (ref 80.0–100.0)
Platelets: 215 10*3/uL (ref 150–400)
RBC: 2.23 MIL/uL — ABNORMAL LOW (ref 4.22–5.81)
RDW: 15.9 % — ABNORMAL HIGH (ref 11.5–15.5)
WBC: 13.4 10*3/uL — ABNORMAL HIGH (ref 4.0–10.5)
nRBC: 0.4 % — ABNORMAL HIGH (ref 0.0–0.2)

## 2020-04-01 LAB — BASIC METABOLIC PANEL
Anion gap: 30 — ABNORMAL HIGH (ref 5–15)
BUN: 134 mg/dL — ABNORMAL HIGH (ref 6–20)
BUN: 133 mg/dL — ABNORMAL HIGH (ref 6–20)
CO2: <7 mmol/L — ABNORMAL LOW (ref 22–32)
CO2: 9 mmol/L — ABNORMAL LOW (ref 22–32)
Calcium: 7.3 mg/dL — ABNORMAL LOW (ref 8.9–10.3)
Calcium: 6.6 mg/dL — ABNORMAL LOW (ref 8.9–10.3)
Chloride: 108 mmol/L (ref 98–111)
Chloride: 106 mmol/L (ref 98–111)
Creatinine, Ser: 15.49 mg/dL — ABNORMAL HIGH (ref 0.61–1.24)
Creatinine, Ser: 15.34 mg/dL — ABNORMAL HIGH (ref 0.61–1.24)
GFR, Estimated: 3 mL/min — ABNORMAL LOW (ref >60)
GFR, Estimated: 3 mL/min — ABNORMAL LOW (ref >60)
Glucose, Bld: 88 mg/dL (ref 70–99)
Glucose, Bld: 76 mg/dL (ref 70–99)
Potassium: 4.2 mmol/L (ref 3.5–5.1)
Potassium: 3 mmol/L — ABNORMAL LOW (ref 3.5–5.1)
Sodium: 141 mmol/L (ref 135–145)
Sodium: 145 mmol/L (ref 135–145)

## 2020-04-01 LAB — HEPATITIS B SURFACE ANTIGEN: Hepatitis B Surface Ag: NONREACTIVE

## 2020-04-01 LAB — GLUCOSE, CAPILLARY
Glucose-Capillary: 79 mg/dL (ref 70–99)
Glucose-Capillary: 75 mg/dL (ref 70–99)

## 2020-04-01 LAB — BLOOD GAS, VENOUS
Acid-base deficit: 23.9 mmol/L — ABNORMAL HIGH (ref 0.0–2.0)
Bicarbonate: 4.8 mmol/L — ABNORMAL LOW (ref 20.0–28.0)
Drawn by: 2585
FIO2: 21
O2 Saturation: 92.3 %
Patient temperature: 37
pCO2, Ven: <19 mmHg — CL (ref 44.0–60.0)
pH, Ven: 7.039 — CL (ref 7.250–7.430)
pO2, Ven: 80.4 mmHg — ABNORMAL HIGH (ref 32.0–45.0)

## 2020-04-01 LAB — PHOSPHORUS: Phosphorus: >30 mg/dL — ABNORMAL HIGH (ref 2.5–4.6)

## 2020-04-01 LAB — MAGNESIUM: Magnesium: 2.2 mg/dL (ref 1.7–2.4)

## 2020-04-01 MED ORDER — CHLORHEXIDINE GLUCONATE CLOTH 2 % EX PADS
6.0000 | MEDICATED_PAD | Freq: Every day | CUTANEOUS | Status: DC
  Administered 2020-04-02 – 2020-05-03 (×27): 6 via TOPICAL

## 2020-04-01 MED ORDER — MIDAZOLAM HCL 2 MG/2ML IJ SOLN
INTRAMUSCULAR | Status: AC | PRN
  Administered 2020-04-01 (×2): 0.5 mg via INTRAVENOUS

## 2020-04-01 MED ORDER — MIDAZOLAM HCL 2 MG/2ML IJ SOLN
INTRAMUSCULAR | Status: AC
  Filled 2020-04-01: qty 2

## 2020-04-01 MED ORDER — IOHEXOL 300 MG/ML  SOLN
50.0000 mL | Freq: Once | INTRAMUSCULAR | Status: AC | PRN
  Administered 2020-04-20: 50 mL

## 2020-04-01 MED ORDER — SODIUM CHLORIDE 0.9 % IV SOLN: 100.0000 mL | INTRAVENOUS | Status: DC | PRN

## 2020-04-01 MED ORDER — HEPARIN SODIUM (PORCINE) 1000 UNIT/ML IJ SOLN
INTRAMUSCULAR | Status: AC
  Filled 2020-04-01: qty 1

## 2020-04-01 MED ORDER — FENTANYL CITRATE (PF) 100 MCG/2ML IJ SOLN
INTRAMUSCULAR | Status: AC
  Filled 2020-04-01: qty 2

## 2020-04-01 MED ORDER — LIDOCAINE HCL (PF) 1 % IJ SOLN
INTRAMUSCULAR | Status: AC
  Administered 2020-04-01: 5 mL
  Filled 2020-04-01: qty 30

## 2020-04-01 MED ORDER — PENTAFLUOROPROP-TETRAFLUOROETH EX AERO: 1.0000 "application " | INHALATION_SPRAY | CUTANEOUS | Status: DC | PRN

## 2020-04-01 MED ORDER — POTASSIUM CHLORIDE 10 MEQ/100ML IV SOLN
10.0000 meq | INTRAVENOUS | Status: AC
Start: 2020-04-01 — End: 2020-04-01
  Administered 2020-04-01 (×2): 10 meq via INTRAVENOUS
  Filled 2020-04-01 (×2): qty 100

## 2020-04-01 MED ORDER — POTASSIUM CHLORIDE 10 MEQ/100ML IV SOLN
10.0000 meq | INTRAVENOUS | Status: DC
Start: 2020-04-01 — End: 2020-04-01
  Administered 2020-04-01 (×2): 10 meq via INTRAVENOUS
  Filled 2020-04-01 (×2): qty 100

## 2020-04-01 MED ORDER — CHLORHEXIDINE GLUCONATE CLOTH 2 % EX PADS
6.0000 | MEDICATED_PAD | Freq: Every day | CUTANEOUS | Status: DC
  Administered 2020-04-01: 6 via TOPICAL

## 2020-04-01 MED ORDER — SODIUM BICARBONATE 8.4 % IV SOLN
100.0000 meq | Freq: Once | INTRAVENOUS | Status: AC
  Administered 2020-04-01: 100 meq via INTRAVENOUS
  Filled 2020-04-01: qty 50

## 2020-04-01 MED ORDER — SODIUM BICARBONATE 8.4 % IV SOLN
INTRAVENOUS | Status: AC
  Filled 2020-04-01: qty 50

## 2020-04-01 MED ORDER — HEPARIN SODIUM (PORCINE) 1000 UNIT/ML IJ SOLN
INTRAMUSCULAR | Status: AC
  Filled 2020-04-01: qty 4

## 2020-04-01 MED ORDER — SODIUM CHLORIDE 0.9% FLUSH
5.0000 mL | Freq: Three times a day (TID) | INTRAVENOUS | Status: DC
  Administered 2020-04-01 – 2020-04-24 (×49): 5 mL

## 2020-04-01 MED ORDER — ALTEPLASE 2 MG IJ SOLR: 2.0000 mg | Freq: Once | INTRAMUSCULAR | Status: DC | PRN

## 2020-04-01 MED ORDER — LIDOCAINE-PRILOCAINE 2.5-2.5 % EX CREA
1.0000 "application " | TOPICAL_CREAM | CUTANEOUS | Status: DC | PRN
  Filled 2020-04-01: qty 5

## 2020-04-01 MED ORDER — LIDOCAINE HCL (PF) 1 % IJ SOLN: 5.0000 mL | INTRAMUSCULAR | Status: DC | PRN

## 2020-04-01 MED ORDER — FENTANYL CITRATE (PF) 100 MCG/2ML IJ SOLN
INTRAMUSCULAR | Status: AC | PRN
  Administered 2020-04-01 (×2): 25 ug via INTRAVENOUS

## 2020-04-01 MED ORDER — SODIUM CHLORIDE 0.9 % IV SOLN
1.0000 g | Freq: Once | INTRAVENOUS | Status: AC
  Administered 2020-04-01: 1 g via INTRAVENOUS
  Filled 2020-04-01: qty 10

## 2020-04-01 MED ORDER — HEPARIN SODIUM (PORCINE) 1000 UNIT/ML DIALYSIS: 1000.0000 [IU] | INTRAMUSCULAR | Status: DC | PRN

## 2020-04-01 NOTE — Consult Note (Signed)
Chief Complaint: Patient was seen in consultation today for bilateral hydronephrosis and AKI/bilateral PCN placements and non-tunneled HD catheter placement.  Referring Physician(s): Opyd, Ilene Qua [facility]); Reesa Chew (nephorlogy)  Supervising Physician: Aletta Edouard  Patient Status: [facility] - In-pt  History of Present Illness: Chase Findling. is a 55 y.o. male with a past medical history of CVA, GI bleed, CKD, urinary retention/BPH with chronic urinary foley catheter in place, Hodgkin's lymphoma, and PTSD. He is known to IR- he has undergone multiple procedures with [place] (left TG abscess drain placement 11/15/2019- removed 12/03/2019; right PCN placement 11/16/2019; right ureteral stent placement and right PCN removal 12/08/2019; bilateral PCN placements 12/27/2019; left TG fistula drain placement 12/31/2019; right ureteral stent replacement and right PCN placement 01/26/2020). Of note, also has left ureteral stent in place, placed by urology 11/14/2019. He presented to [facility] ED via EMS for management of AMS- he was found on the ground in his home, presented to ED obtunded without PCNs in place (unsure how long they have been out of place). In ED, labs also revealed AKI. He was transferred and admitted to [facility] for escalation of care/further management. Nephrology and urology have been consulted who recommend initiation of HD and bilateral PCN placements.  IR consulted by Dr. Myna Hidalgo and Dr. Joylene Grapes for possible image-guided bilateral PCN placements, along with image-guided non-tunneled HD catheter placement. Patient laying in bed, ill-appearing, lethargic. Does not open eyes, nonverbal (not his baseline) but does intermittently follow simple commands (opens mouth on command). History difficult to obtain secondary to AMS.   Past Medical History:  Diagnosis Date  . Cervical radiculopathy    with left sided weakness  . Chemical exposure    service related injury  . Chronic pain   . CKD (chronic  kidney disease), stage IV ([place]) 10/17/2019  . COVID-19   . GERD (gastroesophageal reflux disease)   . GIB (gastrointestinal bleeding)   . Heart attack ([place])   . History of CVA (cerebrovascular accident) 12/31/2019  . 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 History of stomach ulcers 05/19/2013   Formatting of this note might be different from the original. Attributed to chronic ibuprofen use  . Hodgkin lymphoma, unspecified, unspecified site ([place]) 12/10/2012   Formatting of this note might be different from the original. In remission.  . Hypokalemia   . Hypomagnesemia   . PTSD (post-traumatic stress disorder)   . Renal disorder    CKD  . Stroke ([place])   . Urinary retention    due to bladder outlet obstruction    Past Surgical History:  Procedure Laterality Date  . COLONOSCOPY    . CYSTOSCOPY W/ URETERAL STENT PLACEMENT N/A 11/14/2019   Procedure: CYSTOSCOPY WITH RETROGRADE PYELOGRAM bilateral Wyvonnia Dusky STENT PLACEMENT left fulguration bladder . cystogram;  Surgeon: Raynelle Bring, MD;  Location: [facility] ORS;  Service: Urology;  Laterality: N/A;  . CYSTOSCOPY W/ URETERAL STENT PLACEMENT Bilateral 12/25/2019   Procedure: CYSTOSCOPY WITH bilateral  RETROGRADE PYELOGRAM/ leftURETERAL STENT PLACEMENT;  Surgeon: Lucas Mallow, MD;  Location: [facility]S;  Service: Urology;  Laterality: Bilateral;  . GASTRIC BYPASS    . HERNIA REPAIR    . IR NEPHROSTOMY PLACEMENT LEFT  12/27/2019  . IR NEPHROSTOMY PLACEMENT RIGHT  11/16/2019  . IR NEPHROSTOMY PLACEMENT RIGHT  12/27/2019  . IR SINUS/FIST TUBE CHK-NON GI  12/03/2019  . IR URETERAL STENT PLACEMENT EXISTING ACCESS RIGHT  12/08/2019  . IR URETERAL STENT PLACEMENT EXISTING ACCESS RIGHT  01/26/2020  Allergies: Nsaids and Ciprofloxacin  Medications: Prior to Admission medications   Medication Sig Start Date End Date Taking? Authorizing Provider  Cholecalciferol (VITAMIN D3) 20 MCG (800 UNIT) TABS Take 800 Units by mouth daily.    [provider]   cyanocobalamin (,VITAMIN B-12,) 1000 MCG/ML injection Inject 1,000 mcg into the muscle every 30 (thirty) days.  12/28/13   [provider]  DULoxetine (CYMBALTA) 30 MG capsule Take 30-60 mg by mouth See admin instructions. Take 60 mg in the morning and 30 mg in the evening    [provider]  gabapentin (NEURONTIN) 100 MG capsule Take 2 capsules (200 mg total) by mouth daily. Patient taking differently: Take 800 mg by mouth 3 (three) times daily.  01/09/20 03/03/20  Zigmund Daniel., MD  hydrOXYzine (ATARAX/VISTARIL) 10 MG tablet Take 1 tablet (10 mg total) by mouth 3 (three) times daily as needed for itching or anxiety. Patient taking differently: Take 10 mg by mouth 3 (three) times daily as needed for anxiety.  10/26/19   Edsel Petrin, DO  Multiple Vitamin (MULTIVITAMIN WITH MINERALS) TABS tablet Take 1 tablet by mouth daily. 10/27/19   Edsel Petrin, DO  oxyCODONE-acetaminophen (PERCOCET/ROXICET) 5-325 MG tablet Take by mouth every 4 (four) hours as needed for severe pain.    [provider]  pantoprazole (PROTONIX) 20 MG tablet Take 20 mg by mouth 2 (two) times daily.    [provider]  peg 3350 powder (MOVIPREP) 100 g SOLR Take 1 kit (200 g total) by mouth as directed. 03/03/20   Iva Boop, MD  prazosin (MINIPRESS) 1 MG capsule Take 1 mg by mouth at bedtime.    [provider]  sevelamer carbonate (RENVELA) 800 MG tablet Take 800 mg by mouth 3 (three) times daily with meals.    [provider]  sodium bicarbonate 650 MG tablet Take 650 mg by mouth 2 (two) times daily.    [provider]  tamsulosin (FLOMAX) 0.4 MG CAPS capsule Take 0.4 mg by mouth at bedtime.     [provider]     Family History  Problem Relation Age of Onset  . Renal cancer Father        mets to liver and lungs and pancreas  . Heart disease Father   . Heart disease Paternal Grandfather   . Colon cancer Maternal Grandmother   .  Colon cancer Paternal Uncle   . Stomach cancer Neg Hx   . Esophageal cancer Neg Hx   . Pancreatic cancer Neg Hx     Social History   Socioeconomic History  . Marital status: Widowed    Spouse name: Not on file  . Number of children: Not on file  . Years of education: Not on file  . Highest education level: Not on file  Occupational History  . Not on file  Tobacco Use  . Smoking status: Never Smoker  . Smokeless tobacco: Current User  Vaping Use  . Vaping Use: Never used  Substance and Sexual Activity  . Alcohol use: Never  . Drug use: Never  . Sexual activity: Not on file  Other Topics Concern  . 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  All other systems are negative.  Review of Systems  Unable to perform ROS: Mental status change    Vital Signs: BP (!) 102/54   Pulse 63   Temp 98.3 F (36.8 C) (Oral)   Resp 20   Ht $R'5\' 6"'lP$  (1.676 m)   Wt 117 lb 11.2 oz (53.4 kg)   SpO2 100%   BMI 19.00 kg/m   Physical Exam Constitutional:      General: He is in acute distress.     Appearance: He is ill-appearing.     Comments: Lethargic.  Cardiovascular:     Rate and Rhythm: Normal rate and regular rhythm.     Heart sounds: Normal heart sounds. No murmur heard.   Pulmonary:     Effort: Pulmonary effort is normal. No respiratory distress.     Breath sounds: Normal breath sounds.  No wheezing.  Skin:    General: Skin is warm and dry.  Neurological:     Comments: Lethargic. Nonverbal (not his baseline). Intermittently follows simple commands (opens mouth to command).      MD Evaluation Airway: WNL Heart: WNL Abdomen: WNL Chest/ Lungs: WNL ASA  Classification: 3 Mallampati/Airway Score: Two   Imaging: DG Chest Port 1 View  Result Date: 03/31/2020 CLINICAL DATA:  Altered mental status, questionable sepsis, pending COVID test. EXAM: PORTABLE CHEST 1 VIEW COMPARISON:  Chest x-ray dated 11/08/2019. FINDINGS: Heart size and mediastinal contours are within normal limits. Lungs appear clear. No pleural effusion or pneumothorax is seen. Osseous structures about the chest are unremarkable. IMPRESSION: No active disease. No evidence of pneumonia. Electronically Signed   By: Franki Cabot M.D.   On: 03/31/2020 14:36   CT Renal Stone Study  Result Date: 03/31/2020 CLINICAL DATA:  Flank pain EXAM: CT ABDOMEN AND PELVIS WITHOUT CONTRAST TECHNIQUE: Multidetector CT imaging of the abdomen and pelvis was performed following the standard protocol without IV contrast. COMPARISON:  12/30/2019, 01/26/2020, 11/14/2019, 12/24/2019 FINDINGS: Lower chest: Lung bases demonstrate no acute consolidation or pleural effusion. Normal cardiac size. Hepatobiliary: Status post cholecystectomy. No focal hepatic abnormality. No biliary dilatation. Pancreas: Unremarkable. No pancreatic ductal dilatation or surrounding inflammatory changes. Spleen: Normal in size without focal abnormality. Adrenals/Urinary Tract: Adrenal glands within normal limits. Bilateral percutaneous nephrostomy catheters has been removed. Linear soft tissue tract is visible posteriorly. Bilateral ureteral stents are visualized, proximal pigtail on the right is within extrarenal pelvis, distal pigtail within the posterior bladder. Proximal pigtail of left ureteral stent is visible in the proximal left ureter, the distal portion of  the stent is in the urinary bladder. Moderate bilateral hydronephrosis which is increased compared to the previous CT. Multiple small foci of gas within the bilateral renal collecting systems which may be due to refluxed air from the bladder versus recent removal of percutaneous nephrostomy tubes. Moderate perinephric fat stranding. Foley catheter within the bladder. Small air within the urinary bladder which appears slightly thick-walled. Stomach/Bowel: The stomach is nonenlarged. Evidence of prior gastric bypass. No dilated small bowel. Large amount of stool in the colon. Negative appendix. No acute bowel wall thickening.  Vascular/Lymphatic: Mild aortic atherosclerosis without aneurysm. No suspicious nodes. Reproductive: Slightly enlarged prostate Other: No free air or free fluid. Left trans gluteal percutaneous drainage catheter with pigtail visible in the left posterior pelvis. No significant fluid collections visible within the pelvis at this time. Musculoskeletal: No acute or suspicious osseous abnormality. IMPRESSION: 1. Bilateral percutaneous nephrostomy catheters have been removed. Interim finding of moderate bilateral hydronephrosis of extrarenal pelvises, increased compared to prior CT. Right ureteral stent in expected position. Proximal pigtail of the left ureteral stent is visible within the proximal left ureter. Foley catheter present within decompressed urinary bladder; bladder wall appears slightly thickened. 2. Left trans gluteal percutaneous drainage catheter with pigtail visible in the left posterior pelvis. No significant fluid collections visible within the pelvis at this time. 3. Large amount of stool in the colon, suggesting constipation. Aortic Atherosclerosis (ICD10-I70.0). Electronically Signed   By: Donavan Foil M.D.   On: 03/31/2020 16:32    Labs:  CBC: Recent Labs    01/08/20 0518 01/26/20 1050 03/31/20 1420 04/01/20 0213  WBC 5.9 6.8 16.6* 13.4*  HGB 8.5* 9.4* 8.3* 7.0*   HCT 28.1* 30.3* 24.7* 19.8*  PLT 190 225 262 215    COAGS: Recent Labs    10/17/19 1241 10/17/19 1311 11/07/19 1836 11/07/19 1853 11/10/19 1052 12/08/19 1258 12/27/19 0450 01/26/20 1050 03/31/20 1420  INR 1.5* 1.6*   < >  --    < > 1.1 1.3* 1.2 1.5*  APTT 43* 46*  --  31  --   --   --   --  37*   < > = values in this interval not displayed.    BMP: Recent Labs    01/01/20 0430 01/02/20 0427 01/03/20 0506 01/04/20 0453 01/05/20 0558 01/26/20 1050 03/31/20 1420 04/01/20 0213 04/01/20 0841  NA 143 144 143 143   < > 138 133* 141 145  K 3.5 3.9 3.9 3.9   < > 6.6* 5.1 4.2 3.0*  CL 116* 113* 112* 112*   < > 107 108 108 106  CO2 18* 19* 22 23   < > 19* <7* <7* 9*  GLUCOSE 73 84 81 77   < > 97 126* 88 76  BUN 60* 61* 53* 47*   < > 67* 136* 134* 133*  CALCIUM 7.3* 7.5* 7.5* 7.4*   < > 9.3 7.9* 7.3* 6.6*  CREATININE 6.54* 6.27* 6.42* 5.74*   < > 5.76* 16.69* 15.49* 15.34*  GFRNONAA 9* 9* 9* 10*   < > 11* 3* 3* 3*  GFRAA 10* 11* 10* 12*  --   --   --   --   --    < > = values in this interval not displayed.    LIVER FUNCTION TESTS: Recent Labs    12/28/19 0503 12/30/19 0603 01/06/20 0507 01/07/20 0449 01/08/20 0518 03/31/20 1420  BILITOT 0.3  --   --  0.3 0.7 0.9  AST 28  --   --  37 26 38  ALT 8  --   --  _0 ALKPHOS 87  --   --  86 81 146*  PROT 6.0*  --   --  5.7* 5.6* 8.0  ALBUMIN 2.2*   < > 1.9* 2.0*  2.0* 2.0* 3.4*   < > = values in this interval not displayed.     Assessment and Plan:  Urinary obstruction secondary to pelvic mass with subsequent bilateral hydronephrosis in setting of bilateral ureteral stents, with bilateral  PCNs inadvertently removed (unknown how long they have not been in place). AKI in need of HD. Plan for image-guided bilateral PCN placements along with image-guided non-tunneled HD catheter placement today in IR. Patient is NPO. Afebrile. He does not take blood thinners. INR 1.5 03/31/2020.  Risks and benefits of  bilateral percutaneous nephrostomy tube placement were discussed with the patient including, but not limited to, infection, bleeding, significant bleeding causing loss or decrease in renal function or damage to adjacent structures. All of the patient's son's questions were answered, he is agreeable to proceed.  Consent obtained by patient's son, Chase [name], via telephone- signed and in IR control room.  Risks and benefits discussed with the patient including, but not limited to bleeding, infection, vascular injury, pneumothorax which may require chest tube placement, air embolism or even death. All of the patient's son's questions were answered, he is agreeable to proceed. Consent obtained by patient's son, Chase [name], via telephone- signed and in IR control room.   Thank you for this interesting consult.  I greatly enjoyed meeting Chase ZOLL Sr. and look forward to participating in their care.  A copy of this report was sent to the requesting provider on this date.  Electronically Signed: Earley Abide, PA-C 04/01/2020, 10:35 AM   I spent a total of 40 Minutes in face to face in clinical consultation, greater than 50% of which was counseling/coordinating care for bilateral hydronephrosis and AKI/bilateral PCN placements and non-tunneled HD catheter placement.

## 2020-04-01 NOTE — Progress Notes (Signed)
Notified by lab of critical Venous pH and at 0205.  MD made aware and updated on patient condition - obtunded with slow respirations.  New orders placed for bicarb bolus.

## 2020-04-01 NOTE — Consult Note (Signed)
Consultation Note Date: 04/01/2020   Patient Name: Chase [name]  DOB: 09-06-1965  MRN: [id number]  Age / Sex: 55 y.o., male  PCP: [name] Referring Physician: Aileen Fass, Tammi Klippel, MD  Reason for Consultation: Establishing goals of care  HPI/Patient Profile: 55 y.o. 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 Found to have creatinine of 16. Also with severe urosepsis.  CT scan showed bilateral hydronephrosis with a left transgluteal drainage without fluid collection and a large colonic stool burden. Bilateral percutaneous nephrostomy tubes placed by IR 1/1. Plans for short term HD vs CRRT. Poor candidate for outpatient dialysis. PMT consulted to discuss [place].   Clinical Assessment and Goals of Care: I have reviewed medical records including EPIC notes, labs and imaging, assessed the patient and then spoke with patient's oldest son, Goodwin, to discuss diagnosis prognosis, [place], EOL wishes, disposition and options.  Of note, patient was seen a few times by palliative care in Sept/Oct 08-Jul-2019 during hospitalization. At that time, he requested full code/full scope interventions. Patient is currently unable to participate in conversation d/t AMS.  Airik shares that patient has another son, [name]. He and Quinzell make decisions together. Patient's mother has also been involved in decision making and Jaxston plans to continue to include her; however, he would prefer we talk to him first.  Patient does not have designated HCPOA in chart or that Fil[place]o is aware of. Of note, Darreon has recently moved to [place] and is not in town currently.   I introduced Palliative Medicine as specialized medical care for people living with serious illness. It focuses on providing relief from the symptoms and stress of a serious illness. The goal is to improve quality of life for both the patient and the family.  We discussed a brief life review of the patient. Yann tells me patient lives alone - has [employer] nurses check on him. Zayed shares that patient's wife/Edrick's mother passed away in 07/08/2014 and since that time patient has significantly declined. Zeeshan does not think patient was caring for himself well. He tells me about some possible misuse of prescription drugs. Prophet shares that patient could ambulate with a walker but did not have great use of left side following his stroke. Guenther also tells me about a recent fall at home. He tells me patient has been in and out of the hospital since 07-08-14 - mostly related to urinary tract/kidney issues. He tells me his father looks like a different person than he did one year ago and has become quite frail, lost weight. He does not think he eats well. He tells me he is unsure about patient's cognitive function - he seems clear one moment and then confused at others.    We discussed patient's current illness and what it means in the larger context of patient's on-going co-morbidities. Specifically discussed renal failure, plans for short term HD and nephrostomy placement. Discussed poor candidacy for long term HD. Discussed overall failure to thrive with decline in function, nutrition, and cognition.   I attempted to elicit values and goals of care important to the patient.  Kollyn tells me this is not something they have ever discussed and he is unsure. He is hopeful patient's mental status improves and we are able to speak to him directly about his  wishes.   Discussed with Damarius the importance of continued conversation with family and the medical providers regarding overall plan of care and treatment options, ensuring decisions are within the context of the patient's values and GOCs.    Questions and concerns were addressed. The family was encouraged to call with questions or concerns.   Primary Decision Maker NEXT OF KIN - 2 sons and mother at this time as patient is unable, hopeful for improvement in mental status    SUMMARY OF RECOMMENDATIONS   - son unsure about GOC, he is hopeful for improvement in patient's mental status so we can discuss goals of care directly with him though there was some concern about his cognition/capacity that we will need to evaluate further  - patient's previously stated wishes in Oct were for full code/full scope care  - son agreeable to current plan of care  - will follow  Code Status/Advance Care Planning:  Full code  Prognosis:   Unable to determine  Discharge Planning: To Be Determined      Primary Diagnoses: Present on Admission: . Acute renal failure superimposed on stage 5 chronic kidney disease, not on chronic dialysis ([place]) . Toxic metabolic encephalopathy . Hodgkin lymphoma, unspecified, unspecified site ([place]) . Bilateral ureteral obstruction s/p perc nephrosotmy & stenting . Metabolic acidosis . PTSD (post-traumatic stress disorder) . Chronic pain . Colovesical fistula   I have reviewed the medical record, interviewed the patient and family, and examined the patient. The following aspects are pertinent.  Past Medical History:  Diagnosis Date  . Cervical radiculopathy    with left sided weakness  . Chemical exposure    service related injury  . Chronic pain   . CKD (chronic kidney disease), stage IV ([place]) 10/17/2019  . COVID-19   . GERD (gastroesophageal reflux disease)   . GIB (gastrointestinal bleeding)   . Heart attack ([place])   . History of CVA (cerebrovascular  accident) 12/31/2019  . 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 History of stomach ulcers 05/19/2013   Formatting of this note might be different from the original. Attributed to chronic ibuprofen use  . Hodgkin lymphoma, unspecified, unspecified site ([place]) 12/10/2012   Formatting of this note might be different from the original. In remission.  . Hypokalemia   . Hypomagnesemia   . PTSD (post-traumatic stress disorder)   . Renal disorder    CKD  . Stroke ([place])   . Urinary retention    due to bladder outlet obstruction   Social History   Socioeconomic History  . Marital status: Widowed    Spouse name: Not on file  . Number of children: Not on file  . Years of education: Not on file  . Highest education level: Not on file  Occupational History  . Not on file  Tobacco Use  . Smoking status: Never Smoker  . Smokeless tobacco: Current User  Vaping Use  . Vaping Use: Never used  Substance and Sexual Activity  . Alcohol use: Never  . Drug use: Never  . Sexual activity: Not on file  Other Topics Concern  . 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 Renal cancer Father        mets to liver and lungs and pancreas  . Heart disease Father   . Heart disease Paternal Grandfather   . Colon cancer Maternal Grandmother   . Colon cancer Paternal Uncle   . Stomach cancer Neg Hx   . Esophageal cancer Neg Hx   . Pancreatic cancer Neg Hx    Scheduled  Meds: . Chlorhexidine Gluconate Cloth  6 each Topical Daily  . Chlorhexidine Gluconate Cloth  6 each Topical Q0600  . fentaNYL      . heparin  5,000 Units Subcutaneous Q8H  . heparin sodium (porcine)      . midazolam      . pantoprazole (PROTONIX) IV  40 mg Intravenous Q24H   Continuous Infusions: . ceFEPime (MAXIPIME) IV    . potassium chloride 10 mEq (04/01/20 1137)  .  sodium bicarbonate (isotonic) infusion in sterile water 150 mL/hr at 04/01/20 0605   PRN Meds:.acetaminophen **OR** acetaminophen, ondansetron **OR** ondansetron (ZOFRAN) IV Allergies  Allergen Reactions  . Nsaids Other (See Comments)    Bleeding GI Ulceration  . Ciprofloxacin Nausea And Vomiting   Review of Systems  Unable to perform ROS: Patient unresponsive    Physical Exam Constitutional:      General: He is not in acute distress.    Comments: Faint grunt to verbal and physical stimulation  Pulmonary:     Effort: Pulmonary effort is normal. No respiratory distress.  Musculoskeletal:     Right lower leg: No edema.     Left lower leg: No edema.  Skin:    General: Skin is warm and dry.     Vital Signs: BP (!) 101/47   Pulse 60   Temp 98.3 F (36.8 C) (Oral)   Resp 12   Ht 5\' 6"  (1.676 m)   Wt 53.4 kg   SpO2 100%   BMI 19.00 kg/m  Pain Scale: 0-10   Pain Score: 0-No pain   SpO2: SpO2: 100 % O2 Device:SpO2: 100 % O2 Flow Rate: .O2 Flow Rate (L/min): 2 L/min  IO: Intake/output summary:   Intake/Output Summary (Last 24 hours) at 04/01/2020 1324 Last data filed at 04/01/2020 1025 Gross per 24 hour  Intake 100 ml  Output 600 ml  Net -500 ml    LBM: Last BM Date:  (unknown) Baseline Weight: Weight: 53.3 kg Most recent weight: Weight: 53.4 kg     Palliative Assessment/Data: PPS 10%    Time Total: 65 minutes Greater than 50%  of this time was spent counseling and coordinating care related to the above assessment and plan.  Juel Burrow, DNP, AGNP-C Palliative Medicine  Team [phone] Pager: [phone]

## 2020-04-01 NOTE — Consult Note (Signed)
Nephrology Consult   Requesting provider: Charlynne Cousins Service requesting consult: Hospitalist Reason for consult: ARF on CKD V   Assessment/Recommendations: Chase EILAND Sr. is a/an 55 y.o. male with a past medical history gastric bypass, hodgkin's lymphoma, colovesical fistula, bilateral ureteral stenosis w/ ckd and hydronephrosis  who present w/ acute renal failure, sepsis, and hydronephrosis   Non-Oliguric AKI on CKD V 2/2 urinary obstruction and dehydration w/ severe electrolyte derrangements: CKD secondary to longstanding obstruction.  AKI likely dehydration with obstruction contributing also with sepsis likely leading to ATN.  Severe electrolyte derangements with severe acidosis and likely uremia -Place [facility] for dialysis -Dialysis today once access is obtained; consider CRRT if he is too unstable -Given his comorbidities his outpatient dialysis candidacy is poor, son agreed to temporary dialysis but will need to have conversations with patient and family once/if he improves about further dialysis -PCN placement and TDC placement per IR, appreciate help -Continue to monitor daily Cr, Dose meds for GFR -Monitor Daily I/Os, Daily weight  -Maintain MAP>65 for optimal renal perfusion.  -Avoid nephrotoxic medications including NSAIDs and Vanc/Zosyn combo  Bilateral hydronephrosis: History of such now with no nephrostomy tubes but stents in place.  IR consulted for PCN placement.  Appreciate help.  Urology consult would be helpful  Sepsis: Likely urosepsis.  Blood cultures no growth for 24 hours.  We are going to try hemodialysis but he may not tolerated and require transfer to the ICU for CRRT.  Antibiotics per primary team  Severe metabolic acidosis: pH 2.585.  PCO2 low.  Serum bicarbonate 5.  IV bicarbonate for now and dialysis as above.  Anemia: Multifactorial with hemoglobin of 7.  Transfusions per primary team.  No indication for ESA at this time.  Hyperphosphatemia:  Phosphorus checked and greater than 30.  Dialysis and PCN placement as above   Recommendations conveyed to primary service.    [facility] 04/01/2020 10:06 AM   _____________________________________________________________________________________ CC: Acute Renal Failure  History of Present Illness: Chase Pavon. is a/an 55 y.o. male with a past medical history of gastric bypass, hodgkin's lymphoma, colovesical fistula, bilateral ureteral stenosis w/ ckd and hydronephrosis  who presents with confusion and ARF  Patient is confused on my examination today.  History was obtained per chart review.  Patient presented to [facility] yesterday after being found down by a neighbor.  He was noted to be hypothermic on arrival, confused.  Leukocytosis was present.  Creatinine was 17.  Baseline thought to be 2.5-3 but when he was hospitalized in October he left with a creatinine closer to 5-6.  CT scan yesterday demonstrated bilateral hydronephrosis no longer with nephrostomy tubes.  He was given hydration, bicarbonate, and transferred to [facility] for further evaluation.  He has had severe acidosis with a pH on VBG close to 7.  He is now on IV bicarbonate.  His urine output has been oliguric/nonoliguric since arriving.  His hypothermia has improved and he has been hemodynamically stable.  He remains confused and unable to answer questions.  I called the patient's son who states that his dad had mentioned possibly needing to go on dialysis outpatient.  He did not say that he would not want this.  The son thinks that at least a temporary trial of dialysis to improve his mental status would be beneficial.   Medications:  Current Facility-Administered Medications  Medication Dose Route Frequency Provider Last Rate Last Admin  . acetaminophen (TYLENOL) tablet 650  mg  650 mg Oral Q6H PRN Opyd, Ilene Qua, MD       Or  . acetaminophen (TYLENOL) suppository 650 mg  650  mg Rectal Q6H PRN Opyd, Ilene Qua, MD      . ceFEPIme (MAXIPIME) 1 g in sodium chloride 0.9 % 100 mL IVPB  1 g Intravenous Q24H Opyd, Ilene Qua, MD      . Chlorhexidine Gluconate Cloth 2 % PADS 6 each  6 each Topical Daily Charlynne Cousins, MD      . Chlorhexidine Gluconate Cloth 2 % PADS 6 each  6 each Topical Q0600 Reesa Chew, MD      . heparin injection 5,000 Units  5,000 Units Subcutaneous Q8H Opyd, Ilene Qua, MD      . ondansetron (ZOFRAN) tablet 4 mg  4 mg Oral Q6H PRN Opyd, Ilene Qua, MD       Or  . ondansetron (ZOFRAN) injection 4 mg  4 mg Intravenous Q6H PRN Opyd, Ilene Qua, MD      . pantoprazole (PROTONIX) injection 40 mg  40 mg Intravenous Q24H Opyd, Ilene Qua, MD   40 mg at 03/31/20 2226  . sodium bicarbonate 150 mEq in sterile water 1,000 mL infusion   Intravenous Continuous Opyd, Ilene Qua, MD 150 mL/hr at 04/01/20 9163 New Bag at 04/01/20 0605   Facility-Administered Medications Ordered in Other Encounters  Medication Dose Route Frequency Provider Last Rate Last Admin  . bupivacaine liposome (EXPAREL) 1.3 % injection 266 mg  20 mL Infiltration Once Michael Boston, MD         ALLERGIES Nsaids and Ciprofloxacin  MEDICAL HISTORY Past Medical History:  Diagnosis Date  . Cervical radiculopathy    with left sided weakness  . Chemical exposure    service related injury  . Chronic pain   . CKD (chronic kidney disease), stage IV ([place]) 10/17/2019  . COVID-19   . GERD (gastroesophageal reflux disease)   . GIB (gastrointestinal bleeding)   . Heart attack ([place])   . History of CVA (cerebrovascular accident) 12/31/2019  . 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 History of stomach ulcers 05/19/2013   Formatting of this note might be different from the original. Attributed to chronic ibuprofen use  . Hodgkin lymphoma, unspecified, unspecified site ([place]) 12/10/2012   Formatting of this note might be different from the original. In remission.  . Hypokalemia   . Hypomagnesemia    . PTSD (post-traumatic stress disorder)   . Renal disorder    CKD  . Stroke ([place])   . Urinary retention    due to bladder outlet obstruction     SOCIAL HISTORY Social History   Socioeconomic History  . Marital status: Widowed    Spouse name: Not on file  . Number of children: Not on file  . Years of education: Not on file  . Highest education level: Not on file  Occupational History  . Not on file  Tobacco Use  . Smoking status: Never Smoker  . Smokeless tobacco: Current User  Vaping Use  . Vaping Use: Never used  Substance and Sexual Activity  . Alcohol use: Never  . Drug use: Never  . Sexual activity: Not on file  Other Topics Concern  . 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 Renal cancer Father        mets to liver and lungs and pancreas  . Heart disease Father   . Heart disease Paternal Grandfather   . Colon cancer Maternal Grandmother   . Colon cancer Paternal Uncle   . Stomach cancer Neg Hx   . Esophageal cancer Neg Hx   . Pancreatic cancer Neg Hx       Review of Systems: Unable to obtain review of systems due to the patient's altered mental status  Physical Exam: Vitals:   04/01/20 0600 04/01/20 0829  BP: (!) 104/57 (!) 102/54  Pulse: 67 63  Resp: (!) 9 20  Temp: 99 F (37.2 C) 98.3 F (36.8 C)  SpO2: 100% 100%   No intake/output data  recorded.  Intake/Output Summary (Last 24 hours) at 04/01/2020 1006 Last data filed at 04/01/2020 0502 Gross per 24 hour  Intake 100 ml  Output 400 ml  Net -300 ml   General: Chronically ill-appearing, no distress HEENT: anicteric sclera, oropharynx clear without lesions CV: Normal rate, no murmurs Lungs: Bilateral chest rise, no increased work of breathing Abd: soft, non-tender, non-distended Skin: no visible lesions or rashes Psych: Somnolent, does not respond to voice Musculoskeletal: no obvious deformities Neuro: Nonresponsive, does move extremities spontaneously  Test Results Reviewed Lab Results  Component Value Date   NA 141 04/01/2020   K 4.2 04/01/2020   CL 108 04/01/2020   CO2 <7 (L) 04/01/2020   BUN 134 (H) 04/01/2020   CREATININE 15.49 (H) 04/01/2020   CALCIUM 7.3 (L) 04/01/2020   ALBUMIN 3.4 (L) 03/31/2020   PHOS >30.0 (H) 04/01/2020     I have reviewed all relevant outside healthcare records related to the patient's current hospitalization

## 2020-04-01 NOTE — Progress Notes (Signed)
Pt just came up from Dialysis, noted that pt was gurgling like aspirating.  upon assessment, pt was howlering and desats to 69% SPO2, pt wast put on 15L O2 via mask. Tried to suction but pt not compliant. Respiratory and rapid RN, DR Myna Hidalgo came to see Pt.

## 2020-04-01 NOTE — Progress Notes (Addendum)
[facility] PROGRESS NOTE    Progress Note  Chase [name]  ZCH:[id number] DOB: 1965/05/08 DOA: 03/31/2020 PCP: [facility], [name]     Brief Narrative:   Chase Godley. is an 55 y.o. 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  Creatinine of 16 (in October 5 0.7) with a leukocytosis CT scan showed bilateral hydronephrosis with a left transgluteal drainage without fluid collection and a large colonic stool burden  Assessment/Plan:   Acute on chronic kidney disease stage IV-V likely due to removal of his nephrostomy tube in the setting of obstructive uropathy: Patient has been fluid resuscitated in the ED with isotonic bicarbonate. Nephrology and IR were consulted, IR to place nephrostomy tubes. Continue isotonic bicarbonate infusion, renal dosing of medication. Continue strict I's and O's and daily weights.  Severe sepsis secondary to UTI: Encephalopathic and hypothermic on admission with a leukocytosis and blood pressure stable. CT scan of the abdomen pelvis showed no abscesses Blood and urine cultures were sent he was fluid resuscitated. Started on cefepime.  Acute metabolic encephalopathy: In the setting of infectious etiology and rising BUN. Started on IV fluids.  Colovesicular fistula: He has a transgluteal drain placed for his abscess with no significant fluid collection seen on CT scan. He is planned for colonoscopy and surgery in January 2022.  Chronic pain: Hold sedative.  History of CVA: Ischemic  in July 2021 history of A. fib not a candidate for anticoagulation due to GI bleed and thrombocytopenia.  PTSD: Hold sedatives.  Normocytic anemia: With a hemoglobin of 8.3.  History of gastric bypass: Continue PPI.  History of Hodgkin's lymphoma: Noted.    Sacral decubitus ulcer stage II present on admission RN Pressure Injury Documentation: Pressure Injury 11/08/19 Coccyx Upper Stage 2 -  Partial thickness loss of dermis presenting as a shallow open injury with a red, pink wound bed without slough. unblanchable with an open slit in the middle non draining  (Active)  11/08/19 0300  Location: Coccyx  Location Orientation: Upper  Staging: Stage 2 -  Partial thickness loss of dermis presenting as a shallow open injury with a red, pink wound bed without slough.  Wound Description (Comments): unblanchable with an open slit in the middle non draining   Present on Admission: Yes    DVT prophylaxis: lovenox Family Communication: I have been unable to contact family. Status is: Inpatient  Remains inpatient appropriate because:Hemodynamically unstable   Dispo: The patient is from: SNF              Anticipated d/c is to: SNF              Anticipated d/c date is: > 3 days              Patient currently is not medically stable to d/c.        Code Status:     Code Status Orders  (From admission, onward)         Start     Ordered   03/31/20 2152  Full code  Continuous        03/31/20 2155  Code Status History    Date Active Date Inactive Code Status Order ID Comments User Context   12/24/2019 1537 01/08/2020 2106 Full Code [id number]  Jonnie Finner, DO ED   11/08/2019 0019 11/19/2019 2232 Full Code [id number]  Jacalyn Lefevre, MD ED   10/17/2019 1832 10/26/2019 1620 Full Code [id number]  Mendel Corning, MD Inpatient   Advance Care Planning Activity        IV Access:    Peripheral IV   Procedures and diagnostic studies:   DG Chest Port 1 View  Result Date:  03/31/2020 CLINICAL DATA:  Altered mental status, questionable sepsis, pending COVID test. EXAM: PORTABLE CHEST 1 VIEW COMPARISON:  Chest x-ray dated 11/08/2019. FINDINGS: Heart size and mediastinal contours are within normal limits. Lungs appear clear. No pleural effusion or pneumothorax is seen. Osseous structures about the chest are unremarkable. IMPRESSION: No active disease. No evidence of pneumonia. Electronically Signed   By: Franki Cabot M.D.   On: 03/31/2020 14:36   CT Renal Stone Study  Result Date: 03/31/2020 CLINICAL DATA:  Flank pain EXAM: CT ABDOMEN AND PELVIS WITHOUT CONTRAST TECHNIQUE: Multidetector CT imaging of the abdomen and pelvis was performed following the standard protocol without IV contrast. COMPARISON:  12/30/2019, 01/26/2020, 11/14/2019, 12/24/2019 FINDINGS: Lower chest: Lung bases demonstrate no acute consolidation or pleural effusion. Normal cardiac size. Hepatobiliary: Status post cholecystectomy. No focal hepatic abnormality. No biliary dilatation. Pancreas: Unremarkable. No pancreatic ductal dilatation or surrounding inflammatory changes. Spleen: Normal in size without focal abnormality. Adrenals/Urinary Tract: Adrenal glands within normal limits. Bilateral percutaneous nephrostomy catheters has been removed. Linear soft tissue tract is visible posteriorly. Bilateral ureteral stents are visualized, proximal pigtail on the right is within extrarenal pelvis, distal pigtail within the posterior bladder. Proximal pigtail of left ureteral stent is visible in the proximal left ureter, the distal portion of the stent is in the urinary bladder. Moderate bilateral hydronephrosis which is increased compared to the previous CT. Multiple small foci of gas within the bilateral renal collecting systems which may be due to refluxed air from the bladder versus recent removal of percutaneous nephrostomy tubes. Moderate perinephric fat stranding. Foley catheter within the bladder. Small air  within the urinary bladder which appears slightly thick-walled. Stomach/Bowel: The stomach is nonenlarged. Evidence of prior gastric bypass. No dilated small bowel. Large amount of stool in the colon. Negative appendix. No acute bowel wall thickening. Vascular/Lymphatic: Mild aortic atherosclerosis without aneurysm. No suspicious nodes. Reproductive: Slightly enlarged prostate Other: No free air or free fluid. Left trans gluteal percutaneous drainage catheter with pigtail visible in the left posterior pelvis. No significant fluid collections visible within the pelvis at this time. Musculoskeletal: No acute or suspicious osseous abnormality. IMPRESSION: 1. Bilateral percutaneous nephrostomy catheters have been removed. Interim finding of moderate bilateral hydronephrosis of extrarenal pelvises, increased compared to prior CT. Right ureteral stent in expected position. Proximal pigtail of the left ureteral stent is visible within the proximal left ureter. Foley catheter present within decompressed urinary bladder; bladder wall appears slightly thickened. 2. Left trans gluteal percutaneous drainage catheter with pigtail visible in the left posterior pelvis. No significant fluid collections visible within the pelvis at this time. 3. Large amount of stool in the colon, suggesting constipation. Aortic Atherosclerosis (ICD10-I70.0). Electronically Signed   By: Donavan Foil M.D.   On: 03/31/2020 16:32     Medical Consultants:    None.  Anti-Infectives:   Cefepime  Subjective:    Chase Saxon Sr. patient is nonverbal.  Objective:    Vitals:   04/01/20 0400 04/01/20 0450 04/01/20 0500 04/01/20 0600  BP: 98/65 (!) 108/55  (!) 104/57  Pulse: 76 70 70 67  Resp: 14 12 11  (!) 9  Temp:  98.4 F (36.9 C)  99 F (37.2 C)  TempSrc:  Oral  Axillary  SpO2: 100% 100% 100% 100%  Weight:      Height:       SpO2: 100 %   Intake/Output Summary (Last 24 hours) at 04/01/2020 0739 Last data filed at  04/01/2020 0502 Gross per 24 hour  Intake 100 ml  Output 400 ml  Net -300 ml   Filed Weights   03/31/20 1402 04/01/20 0155  Weight: 53.3 kg 53.4 kg    Exam: General exam: In no acute distress. Respiratory system: Good air movement and clear to auscultation. Cardiovascular system: S1 & S2 heard, RRR. No JVD. Gastrointestinal system: Abdomen is nondistended, soft and nontender.  Extremities: No pedal edema. Skin: No rashes, lesions or ulcers  Data Reviewed:    Labs: Basic Metabolic Panel: Recent Labs  Lab 03/31/20 1420 04/01/20 0213  NA 133* 141  K 5.1 4.2  CL 108 108  CO2 <7* <7*  GLUCOSE 126* 88  BUN 136* 134*  CREATININE 16.69* 15.49*  CALCIUM 7.9* 7.3*  MG  --  2.2  PHOS  --  >30.0*   GFR Estimated Creatinine Clearance: 4 mL/min (A) (by C-G formula based on SCr of 15.49 mg/dL (H)). Liver Function Tests: Recent Labs  Lab 03/31/20 1420  AST 38  ALT 25  ALKPHOS 146*  BILITOT 0.9  PROT 8.0  ALBUMIN 3.4*   No results for input(s): LIPASE, AMYLASE in the last 168 hours. No results for input(s): AMMONIA in the last 168 hours. Coagulation profile Recent Labs  Lab 03/31/20 1420  INR 1.5*   COVID-19 Labs  No results for input(s): DDIMER, FERRITIN, LDH, CRP in the last 72 hours.  Lab Results  Component Value Date   SARSCOV2NAA NEGATIVE 03/31/2020   SARSCOV2NAA NEGATIVE 01/07/2020   [place] NEGATIVE 12/24/2019   [place] NEGATIVE 11/19/2019    CBC: Recent Labs  Lab 03/31/20 1420 04/01/20 0213  WBC 16.6* 13.4*  NEUTROABS 15.1*  --   HGB 8.3* 7.0*  HCT 24.7* 19.8*  MCV 92.2 88.8  PLT 262 215   Cardiac Enzymes: No results for input(s): CKTOTAL, CKMB, CKMBINDEX, TROPONINI in the last 168 hours. BNP (last 3 results) No results for input(s): PROBNP in the last 8760 hours. CBG: No results for input(s): GLUCAP in the last 168 hours. D-Dimer: No results for input(s): DDIMER in the last 72 hours. Hgb A1c: No results for input(s): HGBA1C in  the last 72 hours. Lipid Profile: No results for input(s): CHOL, HDL, LDLCALC, TRIG, CHOLHDL, LDLDIRECT in the last 72 hours. Thyroid function studies: No results for input(s): TSH, T4TOTAL, T3FREE, THYROIDAB in the last 72 hours.  Invalid input(s): FREET3 Anemia work up: No results for input(s): VITAMINB12, FOLATE, FERRITIN, TIBC, IRON, RETICCTPCT in the last 72 hours. Sepsis Labs: Recent Labs  Lab 03/31/20 1420 03/31/20 1641 04/01/20 0213  WBC 16.6*  --  13.4*  LATICACIDVEN 0.9 0.6  --    Microbiology Recent Results (from the past 240 hour(s))  Blood Culture (routine x 2)     Status: None (Preliminary result)   Collection Time: 03/31/20  2:20 PM   Specimen: Blood  Result Value Ref Range Status   Specimen Description BLOOD BLOOD LEFT FOREARM  Final   Special  Requests   Final    BOTTLES DRAWN AEROBIC AND ANAEROBIC Blood Culture adequate volume Performed at [facility], [address], [place], [place] [address]    Culture PENDING  Incomplete   Report Status PENDING  Incomplete  Blood Culture (routine x 2)     Status: None (Preliminary result)   Collection Time: 03/31/20  2:20 PM   Specimen: Blood  Result Value Ref Range Status   Specimen Description BLOOD LEFT ANTECUBITAL  Final   Special Requests   Final    BOTTLES DRAWN AEROBIC AND ANAEROBIC Blood Culture adequate volume Performed at [facility], [address], [place], [place] [address]    Culture PENDING  Incomplete   Report Status PENDING  Incomplete  Resp Panel by RT-PCR (Flu A&B, Covid) Nasopharyngeal Swab     Status: None   Collection Time: 03/31/20  2:48 PM   Specimen: Nasopharyngeal Swab; Nasopharyngeal(NP) swabs in vial transport medium  Result Value Ref Range Status   SARS Coronavirus 2 by RT PCR NEGATIVE NEGATIVE Final    Comment: (NOTE) SARS-CoV-2 target nucleic acids are NOT DETECTED.  The SARS-CoV-2 RNA is generally detectable in upper respiratory specimens during the acute phase of infection. The  lowest concentration of SARS-CoV-2 viral copies this assay can detect is 138 copies/mL. A negative result does not preclude SARS-Cov-2 infection and should not be used as the sole basis for treatment or other patient management decisions. A negative result may occur with  improper specimen collection/handling, submission of specimen other than nasopharyngeal swab, presence of viral mutation(s) within the areas targeted by this assay, and inadequate number of viral copies(<138 copies/mL). A negative result must be combined with clinical observations, patient history, and epidemiological information. The expected result is Negative.  Fact Sheet for Patients:  EntrepreneurPulse.com.au  Fact Sheet for Healthcare Providers:  IncredibleEmployment.be  This test is no t yet approved or cleared by the [place] FDA and  has been authorized for detection and/or diagnosis of SARS-CoV-2 by FDA under an Emergency Use Authorization (EUA). This EUA will remain  in effect (meaning this test can be used) for the duration of the COVID-19 declaration under Section 564(b)(1) of the Act, 21 U.S.C.section 360bbb-3(b)(1), unless the authorization is terminated  or revoked sooner.       Influenza A by PCR NEGATIVE NEGATIVE Final   Influenza B by PCR NEGATIVE NEGATIVE Final    Comment: (NOTE) The Xpert Xpress SARS-CoV-2/FLU/RSV plus assay is intended as an aid in the diagnosis of influenza from Nasopharyngeal swab specimens and should not be used as a sole basis for treatment. Nasal washings and aspirates are unacceptable for Xpert Xpress SARS-CoV-2/FLU/RSV testing.  Fact Sheet for Patients: EntrepreneurPulse.com.au  Fact Sheet for Healthcare Providers: IncredibleEmployment.be  This test is not yet approved or cleared by the [place] FDA and has been authorized for detection and/or diagnosis of SARS-CoV-2 by FDA under  an Emergency Use Authorization (EUA). This EUA will remain in effect (meaning this test can be used) for the duration of the COVID-19 declaration under Section 564(b)(1) of the Act, 21 U.S.C. section 360bbb-3(b)(1), unless the authorization is terminated or revoked.  Performed at [facility], [address], [place], [place] [address]      Medications:    Chlorhexidine Gluconate Cloth  6 each Topical Daily   heparin  5,000 Units Subcutaneous Q8H   pantoprazole (PROTONIX) IV  40 mg Intravenous Q24H   Continuous Infusions:  ceFEPime (MAXIPIME) IV      sodium bicarbonate (  isotonic) infusion in sterile water 150 mL/hr at 04/01/20 0605      LOS: 1 day   Charlynne Cousins  [facility]  04/01/2020, 7:39 AM

## 2020-04-01 NOTE — Procedures (Addendum)
Interventional Radiology Procedure Note  Procedure: Bilateral percutaneous nephrostomy tube placement; right IJ non-tunneled HD catheter placement  Complications: None  Estimated Blood Loss: < 10 mL  Findings: Bilateral signigicant hydronephrosis by [place]. 21 G needle puncture of bilateral collecting systems. Both indwelling ureteral stents relatively occluded after contrast injection under fluoro.  10 Fr PCN's placed bilaterally via new sticks. Old right PCN tract was completely closed/healed. Both formed in renal pelvis. Return of grossly purulent fluid on left; sample sent for culture. Both connected to gravity bags.  Right IJ temp HD catheter via right IJ vein: 13 Fr, 16 cm, triple lumen [place]. Tip in lower SVC. OK to use.  Venetia Night. Kathlene Cote, M.D Pager:  [phone]

## 2020-04-01 NOTE — Sedation Documentation (Signed)
Temp cath currently being placed by MD.

## 2020-04-01 NOTE — Progress Notes (Signed)
IR consulted by Dr. Myna Hidalgo and Dr. Joylene Grapes for possible image-guided bilateral PCN placement.  Case/images have been reviewed by Dr. Kathlene Cote who recommends both urology and nephology consults prior to placement due to complexity of patient's case- this has been discussed between Dr. Kathlene Cote and Dr. Aileen Fass via telephone. INR 1.5 yesterday. Please keep patient NPO at this time.  Please call IR with questions/concerns.   Bea Graff Louk, PA-C 04/01/2020, 9:58 AM

## 2020-04-02 ENCOUNTER — Inpatient Hospital Stay (HOSPITAL_COMMUNITY): Payer: No Typology Code available for payment source

## 2020-04-02 DIAGNOSIS — A419 Sepsis, unspecified organism: Principal | ICD-10-CM

## 2020-04-02 DIAGNOSIS — J9601 Acute respiratory failure with hypoxia: Secondary | ICD-10-CM

## 2020-04-02 DIAGNOSIS — R6520 Severe sepsis without septic shock: Secondary | ICD-10-CM | POA: Diagnosis not present

## 2020-04-02 DIAGNOSIS — N179 Acute kidney failure, unspecified: Secondary | ICD-10-CM | POA: Diagnosis not present

## 2020-04-02 DIAGNOSIS — G928 Other toxic encephalopathy: Secondary | ICD-10-CM | POA: Diagnosis not present

## 2020-04-02 DIAGNOSIS — C8114 Nodular sclerosis classical Hodgkin lymphoma, lymph nodes of axilla and upper limb: Secondary | ICD-10-CM | POA: Diagnosis not present

## 2020-04-02 DIAGNOSIS — N135 Crossing vessel and stricture of ureter without hydronephrosis: Secondary | ICD-10-CM | POA: Diagnosis not present

## 2020-04-02 LAB — BLOOD GAS, ARTERIAL
Acid-base deficit: 5.2 mmol/L — ABNORMAL HIGH (ref 0.0–2.0)
Bicarbonate: 18.1 mmol/L — ABNORMAL LOW (ref 20.0–28.0)
Drawn by: 560371
FIO2: 80
O2 Saturation: 82.3 %
Patient temperature: 36.6
pCO2 arterial: 26.2 mmHg — ABNORMAL LOW (ref 32.0–48.0)
pH, Arterial: 7.452 — ABNORMAL HIGH (ref 7.350–7.450)
pO2, Arterial: 44.3 mmHg — ABNORMAL LOW (ref 83.0–108.0)

## 2020-04-02 LAB — GLUCOSE, CAPILLARY
Glucose-Capillary: 73 mg/dL (ref 70–99)
Glucose-Capillary: 74 mg/dL (ref 70–99)
Glucose-Capillary: 77 mg/dL (ref 70–99)
Glucose-Capillary: 78 mg/dL (ref 70–99)
Glucose-Capillary: 85 mg/dL (ref 70–99)
Glucose-Capillary: 88 mg/dL (ref 70–99)

## 2020-04-02 LAB — BASIC METABOLIC PANEL
Anion gap: 22 — ABNORMAL HIGH (ref 5–15)
BUN: 59 mg/dL — ABNORMAL HIGH (ref 6–20)
CO2: 18 mmol/L — ABNORMAL LOW (ref 22–32)
Calcium: 6.8 mg/dL — ABNORMAL LOW (ref 8.9–10.3)
Chloride: 102 mmol/L (ref 98–111)
Creatinine, Ser: 7.58 mg/dL — ABNORMAL HIGH (ref 0.61–1.24)
GFR, Estimated: 8 mL/min — ABNORMAL LOW (ref >60)
Glucose, Bld: 75 mg/dL (ref 70–99)
Potassium: 3 mmol/L — ABNORMAL LOW (ref 3.5–5.1)
Sodium: 142 mmol/L (ref 135–145)

## 2020-04-02 LAB — URINE CULTURE

## 2020-04-02 LAB — CBC
HCT: 18.1 % — ABNORMAL LOW (ref 39.0–52.0)
Hemoglobin: 6.3 g/dL — CL (ref 13.0–17.0)
MCH: 29.7 pg (ref 26.0–34.0)
MCHC: 34.8 g/dL (ref 30.0–36.0)
MCV: 85.4 fL (ref 80.0–100.0)
Platelets: 220 10*3/uL (ref 150–400)
RBC: 2.12 MIL/uL — ABNORMAL LOW (ref 4.22–5.81)
RDW: 15.3 % (ref 11.5–15.5)
WBC: 12.5 10*3/uL — ABNORMAL HIGH (ref 4.0–10.5)
nRBC: 0.2 % (ref 0.0–0.2)

## 2020-04-02 LAB — HEMOGLOBIN AND HEMATOCRIT, BLOOD
HCT: 22.2 % — ABNORMAL LOW (ref 39.0–52.0)
Hemoglobin: 7.9 g/dL — ABNORMAL LOW (ref 13.0–17.0)

## 2020-04-02 LAB — PHOSPHORUS: Phosphorus: 4.2 mg/dL (ref 2.5–4.6)

## 2020-04-02 LAB — PREPARE RBC (CROSSMATCH)

## 2020-04-02 MED ORDER — SODIUM CHLORIDE 0.9% IV SOLUTION: Freq: Once | INTRAVENOUS | Status: AC

## 2020-04-02 MED ORDER — FENTANYL CITRATE (PF) 100 MCG/2ML IJ SOLN: 12.5000 ug | INTRAMUSCULAR | Status: DC | PRN

## 2020-04-02 MED ORDER — KCL-LACTATED RINGERS 20 MEQ/L IV SOLN
INTRAVENOUS | Status: DC
  Filled 2020-04-02: qty 1000

## 2020-04-02 MED ORDER — POTASSIUM CHLORIDE 2 MEQ/ML IV SOLN
INTRAVENOUS | Status: DC
  Filled 2020-04-02 (×16): qty 1000

## 2020-04-02 MED ORDER — FENTANYL CITRATE (PF) 100 MCG/2ML IJ SOLN
12.5000 ug | INTRAMUSCULAR | Status: AC | PRN
  Administered 2020-04-02 (×2): 12.5 ug via INTRAVENOUS
  Filled 2020-04-02 (×2): qty 2

## 2020-04-02 NOTE — Progress Notes (Signed)
As per Day Shift, pt is going to Dialysis tonight. Pt has soft BP and Day shift made DR aware of it.Dr Melvia Heaps came to see pt and He Okd, he personally brought pt to dialysis.

## 2020-04-02 NOTE — Progress Notes (Signed)
Cross-coverage note:   Patient was seen for acute hypoxic respiratory failure.   He presented 12/31 obtunded and with his b/l nephrostomy tubes pulled out. He was found to be in acute renal failure with uremia and severe acidosis, was admitted, had nephrostomy tubes replaced and temp HD catheter placed by IR today. He went to HD tonight but was reportedly not dialyzed due to low BP.   On arrival back to [address], he was saturating in 60s, slightly tachypneic, and with stable BP and normal HR. He had diffuse rhonchi on auscultation, placed on NRB but saturations remained in low-mid 70s. ABG was attempted and CXR obtained which appeared clear.   PCCM was consulted and evaluated the patient. Saturations came up to 90s with nasotracheal suctioning. Appreciate PCCM recommendations, will plan to continue to wean O2 as tolerated, suction as needed, consider CTA chest if he remains significantly hypoxic, otherwise repeat CXR in am.

## 2020-04-02 NOTE — Significant Event (Signed)
Rapid Response Event Note   Reason for Call : Acute hypoxic respiratory failure Initial Focused Assessment:  I was notified regarding Chase [name]' saturations in the 60s after returning from Dialysis. Upon my arrival, Chase [name] was on NRB mask at 15L and his sats were 74%. BBS were coarse rhonchi, increased WOB with some retractions and accessory muscle use. Pt is only oriented to self and moaning out loud. Skin is warm, pale and dry. Dr. Myna Hidalgo was notified and came promptly to the bedside. PCCM was consulted for possible ICU transfer. PCXR and ABG were obtained. [place] RRT NT suctioned pt with good results.  Pt able to tolerate HFNC at 15L with sats 95%. Will hold on ICU transfer for now per PCCM.     Interventions:  -Stat PCXR -Stat ABG -NTS   Plan of Care:  -Reassess within an hour for possible transfer -Wean oxygen as tolerated for sats >92% -   Event Summary:   MD Notified: at bedside Call Time: [place] Time: 2316 End Time: 0030  Madelynn Done, RN

## 2020-04-02 NOTE — Progress Notes (Signed)
Initial Nutrition Assessment  RD working remotely.  DOCUMENTATION CODES:   Not applicable, suspect severe malnutrition but unable to confirm at this time  INTERVENTION:   Pt has been NPO since 03/31/20 and likley meets criteria for severe malnutrition. Recommend Cortrak placement and initiation of enteral nutrition if within pt's [place]. Recommend: - Initiate Osmolite 1.5 @ 25 ml/hr and advance by 10 ml q 8 hours to goal rate of 55 ml/hr (1320 ml/day) - ProSource TF 45 ml daily  Recommended tube feeding regimen at goal rate would provide 2020 kcal, 94 grams of protein, and 1006 ml of H2O (meets 100% of estimated needs).  Recommend monitoring magnesium, potassium, and phosphorus daily for at least 3 days, MD to replete as needed, as pt is at risk for refeeding syndrome.  - RD will monitor for diet advancement and order oral nutrition supplements as appropriate  - Given pt is s/p gastric bypass surgery, recommend MVI with minerals BID and calcium carbonate 500 mg TID with meals once diet advanced  NUTRITION DIAGNOSIS:   Inadequate oral intake related to lethargy/confusion as evidenced by NPO status.  GOAL:   Patient will meet greater than or equal to 90% of their needs  MONITOR:   Diet advancement,Labs,Weight trends,Skin,I & O's,Other ([place])  REASON FOR ASSESSMENT:   Malnutrition Screening Tool    ASSESSMENT:   55 year old male who presented to the ED on 12/31 after being found on the floor of his home with nephrostomy tubes out. PMH of gastric bypass surgery in 9201 with complications of anastomotic ulcer in 2014, Hodgkin's lymphoma s/p chemotherapy an radiation 2010-2011, colovesical fistula, bilateral ureteral stenosis with CKD and hydronephrosis, CVA in July 2021, PTSD. Admitted with acute renal failure superimposed on CKD IV, metabolic acidosis, hydronephrosis, and severe sepsis secondary to UTI.   01/01 - s/p bilateral percutaneous nephrostomy tube placement and right IJ  non-tunneled HD catheter placement, attempted HD with hypotension, rapid response and PCCM consult  Per Nephrology, plan is for HD tomorrow. Last HD completed the evening of 04/02/20 with 10 ml net UF. Per notes, pt with an extremely poor prognosis. Palliative Medicine is working with family to determine [place].  Pt has been NPO since admission. Per notes, pt remains altered but is somewhat improved.  Unable to obtain diet and weight history at this time. Reviewed weight history available in chart. Pt with an 8.1 kg weight loss since 03/03/20. This is a 13% weight loss in 1 month which is severe and significant for timeframe. Strongly suspect pt with malnutrition but unable to confirm without diet history and/or NFPE.  Medications reviewed and include: IV protonix, IV abx IVF: LR with KCl @ 100 ml/hr  Labs reviewed: potassium 3.0, BUN 59, creatinine 7.58, hemoglobin 6.3 CBG's: 73-79 x 24 hours  Left nephrostomy tube output: 150 ml x 24 hours Right nephrostomy tube output: 300 ml x 24 hours Urethral catheter output: 380 ml x 24 hours I/O's: +1.6 L since admission  NUTRITION - FOCUSED PHYSICAL EXAM:  Unable to complete at this time. RD working remotely.  Diet Order:   Diet Order            Diet NPO time specified  Diet effective now                 EDUCATION NEEDS:   Not appropriate for education at this time  Skin:  Skin Assessment: Skin Integrity Issues: Stage II: coccyx Other: puncture wound right buttocks  Last BM:  no documented BM  Height:   Ht Readings from Last 1 Encounters:  03/31/20 5\' 6"  (1.676 m)    Weight:   Wt Readings from Last 1 Encounters:  04/02/20 54 kg    BMI:  Body mass index is 19.21 kg/m.  Estimated Nutritional Needs:   Kcal:  1850-2050  Protein:  90-110 grams  Fluid:  1.8-2.0 L    Gustavus Bryant, MS, RD, LDN Inpatient Clinical Dietitian Please see AMiON for contact information.

## 2020-04-02 NOTE — Progress Notes (Signed)
[facility] PROGRESS NOTE    Progress Note  Chase [name]  QJF:[id number] DOB: 04-29-1965 DOA: 03/31/2020 PCP: [name]     Brief Narrative:   Chase Noboa. is an 55 y.o. 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  Creatinine of 16 (in October 5 0.7) with a leukocytosis CT scan showed bilateral hydronephrosis with a left transgluteal drainage without fluid collection and a large colonic stool burden  Assessment/Plan:   Acute on chronic kidney disease stage IV-V likely due to removal of his nephrostomy tube in the setting of obstructive uropathy: Patient has been fluid resuscitated in the ED with isotonic bicarbonate. Nephrology was consulted recommended HD, dialysis catheter was placed on 04/01/2020 and HD was attempted which was not successful due to hypotension. IR also placed on 04/01/2020 his bilateral nephrostomy tubes. He has put out about 800 cc of urine. Nephrology recommended dialysis on 04/03/2020.  Recommended to continue IV fluids with lactated Ringer's. His baseline creatinine is around 3-5. Continue monitor I's and O's and daily weights.  Bilateral hydronephrosis: Tubes were removed accidentally, IR was reconsulted and tubes were replaced on 04/01/2020  Severe sepsis secondary to UTI: Encephalopathic and hypothermic on admission with a leukocytosis and blood pressure stable. Blood cultures of show no growth till date, they have shown multiple species. CT scan of the abdomen pelvis showed no  abscesses Continue IV cefepime.  Normocytic anemia: He status post 1 unit of packed red blood cells with dialysis on 04/01/2020, his hemoglobin this morning is 6.3 we will go ahead and give him an additional unit of packed red blood cells.  Acute uremic encephalopathy: In the setting of infectious etiology and rising BUN. Continue IV fluids per renal he is more awake this morning ever to moan and groan but not alert awake oriented to time, person nor place, his responses  Is moaning and groaning and able to protect his airways.  History of pelvic abscess on 11/15/2019 he is status post ATG drain in place by IR /Colovesicular fistula: He has a transgluteal drain placed for his abscess with no significant fluid collection seen on CT scan. He is planned for colonoscopy and surgery in January 2022. Further management per surgery as an outpatient.  Chronic pain: Hold sedative.  History of CVA: Ischemic in July 2021 history of A. fib not a candidate for anticoagulation due to GI bleed and thrombocytopenia.  PTSD: Hold sedatives.  Normocytic anemia: With a hemoglobin of 8.3.  History of gastric bypass: Continue PPI.  History of Hodgkin's lymphoma: Noted.    Sacral decubitus ulcer stage II present on admission RN Pressure Injury Documentation: Pressure Injury 11/08/19 Coccyx Upper Stage 2 -  Partial thickness loss of dermis presenting as a shallow open injury with a red, pink wound bed without slough. unblanchable with an open slit in the middle non draining  (Active)  11/08/19 0300  Location: Coccyx  Location Orientation: Upper  Staging: Stage 2 -  Partial thickness loss of dermis presenting as a shallow open injury with a red, pink wound bed without slough.  Wound Description (Comments): unblanchable with an open  slit in the middle non draining   Present on Admission: Yes   Goals of care/ethics: He has an extremely poor prognosis, I have expressed this to the family Perative care  has been consulted and working with family.  DVT prophylaxis: lovenox Family Communication: I have been unable to contact family. Status is: Inpatient  Remains inpatient appropriate because:Hemodynamically unstable   Dispo: The patient is from: SNF              Anticipated d/c is to: SNF              Anticipated d/c date is: > 3 days              Patient currently is not medically stable to d/c.        Code Status:     Code Status Orders  (From admission, onward)         Start     Ordered   03/31/20 2152  Full code  Continuous        03/31/20 2155        Code Status History    Date Active Date Inactive Code Status Order ID Comments User Context   12/24/2019 1537 01/08/2020 2106 Full Code [id number]  Jonnie Finner, DO ED   11/08/2019 0019 11/19/2019 2232 Full Code [id number]  Jacalyn Lefevre, MD ED   10/17/2019 1832 10/26/2019 1620 Full Code [id number]  Mendel Corning, MD Inpatient   Advance Care Planning Activity        IV Access:    Peripheral IV   Procedures and diagnostic studies:   IR Fluoro Guide CV Line Right  Result Date: 04/01/2020 CLINICAL DATA:  Renal failure and need for emergent hemodialysis. EXAM: NON-TUNNELED CENTRAL VENOUS HEMODIALYSIS CATHETER PLACEMENT WITH ULTRASOUND AND FLUOROSCOPIC GUIDANCE FLUOROSCOPY TIME:  30 seconds. PROCEDURE: The procedure, risks, benefits, and alternatives were explained to the patient's son. Questions regarding the procedure were encouraged and answered. The patient's son understands and consents to the procedure. A time-out was performed prior to initiating the procedure. Ultrasound was used to confirm patency of the right internal jugular vein. The right neck and chest were prepped with chlorhexidine in a sterile fashion, and a sterile drape was applied covering the operative field. Maximum barrier sterile technique with sterile gowns and gloves were used for the procedure. Local anesthesia was provided with 1% lidocaine.  After creating a small venotomy incision, a 21 gauge needle was advanced into the right internal jugular vein under direct, real-time ultrasound guidance. Ultrasound image documentation was performed. After securing guidewire access venous access was dilated over a guidewire. A 16 cm, 13 French triple-lumen Mahurkar non tunneled hemodialysis catheter was advanced over the wire. Final catheter positioning was confirmed and documented with a fluoroscopic spot image. The catheter was aspirated, flushed with saline, and injected with appropriate volume heparin dwells. The catheter exit site was secured with 0-Prolene retention sutures. COMPLICATIONS: None.  No pneumothorax. FINDINGS: After catheter placement, the tip lies at the cavoatrial junction. The catheter aspirates normally and is ready for immediate use. IMPRESSION: Placement of non-tunneled central venous hemodialysis catheter via the right internal jugular vein. The catheter tip lies at the cavoatrial junction. The catheter is ready for immediate use. Electronically Signed   By: Aletta Edouard M.D.   On: 04/01/2020 14:26   IR US Guide Vasc Access Right  Result Date: 04/01/2020 CLINICAL DATA:  Renal failure and need for emergent hemodialysis. EXAM: NON-TUNNELED CENTRAL VENOUS HEMODIALYSIS CATHETER PLACEMENT  WITH ULTRASOUND AND FLUOROSCOPIC GUIDANCE FLUOROSCOPY TIME:  30 seconds. PROCEDURE: The procedure, risks, benefits, and alternatives were explained to the patient's son. Questions regarding the procedure were encouraged and answered. The patient's son understands and consents to the procedure. A time-out was performed prior to initiating the procedure. Ultrasound was used to confirm patency of the right internal jugular vein. The right neck and chest were prepped with chlorhexidine in a sterile fashion, and a sterile drape was applied covering the operative field. Maximum barrier sterile technique with sterile gowns and gloves were used for the  procedure. Local anesthesia was provided with 1% lidocaine. After creating a small venotomy incision, a 21 gauge needle was advanced into the right internal jugular vein under direct, real-time ultrasound guidance. Ultrasound image documentation was performed. After securing guidewire access venous access was dilated over a guidewire. A 16 cm, 13 French triple-lumen Mahurkar non tunneled hemodialysis catheter was advanced over the wire. Final catheter positioning was confirmed and documented with a fluoroscopic spot image. The catheter was aspirated, flushed with saline, and injected with appropriate volume heparin dwells. The catheter exit site was secured with 0-Prolene retention sutures. COMPLICATIONS: None.  No pneumothorax. FINDINGS: After catheter placement, the tip lies at the cavoatrial junction. The catheter aspirates normally and is ready for immediate use. IMPRESSION: Placement of non-tunneled central venous hemodialysis catheter via the right internal jugular vein. The catheter tip lies at the cavoatrial junction. The catheter is ready for immediate use. Electronically Signed   By: Aletta Edouard M.D.   On: 04/01/2020 14:26   DG CHEST PORT 1 VIEW  Result Date: 04/02/2020 CLINICAL DATA:  Evaluate for pulmonary pathology. EXAM: PORTABLE CHEST 1 VIEW COMPARISON:  April 01, 2020 11:40 p.m. FINDINGS: The mediastinal contour and cardiac silhouette are stable. Right central venous line is identified with distal tip in the superior vena cava/right atrium. Consolidation of medial right lung base is identified. The left lung is clear. The bony structures are stable. IMPRESSION: Right lung base pneumonia. Electronically Signed   By: Abelardo Diesel M.D.   On: 04/02/2020 08:21   DG Chest Port 1 View  Result Date: 04/01/2020 CLINICAL DATA:  Acute respiratory distress EXAM: PORTABLE CHEST 1 VIEW COMPARISON:  03/31/2020 FINDINGS: Elevation of the right hemidiaphragm again noted with associated right basilar  atelectasis. Left lung clear. No pneumothorax or pleural effusion. Right internal jugular central venous catheter tip noted at the superior cavoatrial junction. Cardiac size within normal limits. Pulmonary vascularity is normal. No acute bone abnormality. IMPRESSION: No active disease. Electronically Signed   By: Fidela Salisbury MD   On: 04/01/2020 23:48   DG Chest Port 1 View  Result Date: 03/31/2020 CLINICAL DATA:  Altered mental status, questionable sepsis, pending COVID test. EXAM: PORTABLE CHEST 1 VIEW COMPARISON:  Chest x-ray dated 11/08/2019. FINDINGS: Heart size and mediastinal contours are within normal limits. Lungs appear clear. No pleural effusion or pneumothorax is seen. Osseous structures about the chest are unremarkable. IMPRESSION: No active disease. No evidence of pneumonia. Electronically Signed   By: Franki Cabot M.D.   On: 03/31/2020 14:36   CT Renal Stone Study  Result Date: 03/31/2020 CLINICAL DATA:  Flank pain EXAM: CT ABDOMEN AND PELVIS WITHOUT CONTRAST TECHNIQUE: Multidetector CT imaging of the abdomen and pelvis was performed following the standard protocol without IV contrast. COMPARISON:  12/30/2019, 01/26/2020, 11/14/2019, 12/24/2019 FINDINGS: Lower chest: Lung bases demonstrate no acute consolidation or pleural effusion. Normal cardiac size. Hepatobiliary: Status post cholecystectomy. No focal hepatic abnormality. No  biliary dilatation. Pancreas: Unremarkable. No pancreatic ductal dilatation or surrounding inflammatory changes. Spleen: Normal in size without focal abnormality. Adrenals/Urinary Tract: Adrenal glands within normal limits. Bilateral percutaneous nephrostomy catheters has been removed. Linear soft tissue tract is visible posteriorly. Bilateral ureteral stents are visualized, proximal pigtail on the right is within extrarenal pelvis, distal pigtail within the posterior bladder. Proximal pigtail of left ureteral stent is visible in the proximal left ureter, the  distal portion of the stent is in the urinary bladder. Moderate bilateral hydronephrosis which is increased compared to the previous CT. Multiple small foci of gas within the bilateral renal collecting systems which may be due to refluxed air from the bladder versus recent removal of percutaneous nephrostomy tubes. Moderate perinephric fat stranding. Foley catheter within the bladder. Small air within the urinary bladder which appears slightly thick-walled. Stomach/Bowel: The stomach is nonenlarged. Evidence of prior gastric bypass. No dilated small bowel. Large amount of stool in the colon. Negative appendix. No acute bowel wall thickening. Vascular/Lymphatic: Mild aortic atherosclerosis without aneurysm. No suspicious nodes. Reproductive: Slightly enlarged prostate Other: No free air or free fluid. Left trans gluteal percutaneous drainage catheter with pigtail visible in the left posterior pelvis. No significant fluid collections visible within the pelvis at this time. Musculoskeletal: No acute or suspicious osseous abnormality. IMPRESSION: 1. Bilateral percutaneous nephrostomy catheters have been removed. Interim finding of moderate bilateral hydronephrosis of extrarenal pelvises, increased compared to prior CT. Right ureteral stent in expected position. Proximal pigtail of the left ureteral stent is visible within the proximal left ureter. Foley catheter present within decompressed urinary bladder; bladder wall appears slightly thickened. 2. Left trans gluteal percutaneous drainage catheter with pigtail visible in the left posterior pelvis. No significant fluid collections visible within the pelvis at this time. 3. Large amount of stool in the colon, suggesting constipation. Aortic Atherosclerosis (ICD10-I70.0). Electronically Signed   By: Donavan Foil M.D.   On: 03/31/2020 16:32   IR NEPHROSTOMY PLACEMENT LEFT  Result Date: 04/01/2020 CLINICAL DATA:  History of chronic pelvic abscess with colovesical  fistula and bilateral ureteral obstruction requiring prior bilateral percutaneous nephrostomy tube placement. Status post prior left ureteral stent placement in September and right ureteral stent placement in October. Admitted with severe acute kidney injury superimposed on chronic kidney disease, lack of urine output and evidence of bilateral hydronephrosis by imaging. An indwelling right nephrostomy tube has been pulled out for unknown length of time. He now presents for new bilateral percutaneous nephrostomy tube placement for renal decompression to determine degree of recovery will renal function. EXAM: 1. LEFT PERCUTANEOUS NEPHROSTOMY TUBE PLACEMENT 2. RIGHT PERCUTANEOUS NEPHROSTOMY TUBE PLACEMENT COMPARISON:  CT of the abdomen and pelvis on 03/31/2020 ANESTHESIA/SEDATION: Moderate (conscious) sedation was employed during this procedure. A total of Versed 1.0 mg and Fentanyl 50 mcg was administered intravenously. Moderate Sedation Time: 27 minutes. The patient's level of consciousness and vital signs were monitored continuously by radiology nursing throughout the procedure under my direct supervision. CONTRAST:  15 mL Omnipaque 300 MEDICATIONS: 1 g IV Rocephin. Antibiotic was administered in an appropriate time frame prior to skin puncture. FLUOROSCOPY TIME:  1 minute and 54 seconds.  8.0 mGy. PROCEDURE: The procedure, risks, benefits, and alternatives were explained to the patient's son by telephone. Questions regarding the procedure were encouraged and answered. The patient's son understands and consents to the procedure. A time-out was performed prior to initiating the procedure. Bilateral flank regions were prepped with chlorhexidine in a sterile fashion, and a sterile drape was applied  covering the operative field. A sterile gown and sterile gloves were used for the procedure. Local anesthesia was provided with 1% Lidocaine. Ultrasound was used to localize the left kidney. Under direct ultrasound guidance,  a 21 gauge needle was advanced into the renal collecting system. Ultrasound image documentation was performed. Aspiration of urine sample was performed followed by contrast injection under fluoroscopy. A transitional dilator was advanced over a guidewire. Percutaneous tract dilatation was then performed over the guidewire. A 10-French percutaneous nephrostomy tube was then advanced and formed in the left renal collecting system. Additional urine sample was aspirated and sent for culture analysis. Catheter position was confirmed by fluoroscopy after contrast injection. Ultrasound was then used to localize the right kidney. A 21 gauge needle was advanced under ultrasound guidance into the right renal collecting system and ultrasound image documentation performed. After aspiration of urine, contrast was injected under fluoroscopy. A transitional dilator was advanced over a guidewire under fluoroscopy. The tract was dilated and a 10 French percutaneous nephrostomy tube advanced and formed in the right renal collecting system. Catheter position was confirmed by fluoroscopy after contrast injection. Both nephrostomy tubes were secured at the skin with Prolene retention sutures and Stat-Lock devices. Both were connected to gravity drainage bags. COMPLICATIONS: None. FINDINGS: Ultrasound confirms the presence of significant hydronephrosis bilaterally. After access of the left renal collecting system and placement of a nephrostomy tube formed in the renal pelvis, there is return of grossly purulent appearing urine. A sample of purulent fluid was sent for culture analysis. After placement of the right nephrostomy tube which was also formed in the renal pelvis, there is return of some blood tinged urine. IMPRESSION: Placement of bilateral 10 French percutaneous nephrostomy tubes formed at the level of the renal pelvis. Urine from the left kidney was grossly purulent and a sample sent for culture analysis. Both nephrostomy  tubes were connected to gravity drainage bags. Electronically Signed   By: Aletta Edouard M.D.   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 Status post prior left ureteral stent placement in September and right ureteral stent placement in October. Admitted with severe acute kidney injury superimposed on chronic kidney disease, lack of urine output and evidence of bilateral hydronephrosis by imaging. An indwelling right nephrostomy tube has been pulled out for unknown length of time. He now presents for new bilateral percutaneous nephrostomy tube placement for renal decompression to determine degree of recovery will renal function. EXAM: 1. LEFT PERCUTANEOUS NEPHROSTOMY TUBE PLACEMENT 2. RIGHT PERCUTANEOUS NEPHROSTOMY TUBE PLACEMENT COMPARISON:  CT of the abdomen and pelvis on 03/31/2020 ANESTHESIA/SEDATION: Moderate (conscious) sedation was employed during this procedure. A total of Versed 1.0 mg and Fentanyl 50 mcg was administered intravenously. Moderate Sedation Time: 27 minutes. The patient's level of consciousness and vital signs were monitored continuously by radiology nursing throughout the procedure under my direct supervision. CONTRAST:  15 mL Omnipaque 300 MEDICATIONS: 1 g IV Rocephin. Antibiotic was administered in an appropriate time frame prior to skin puncture. FLUOROSCOPY TIME:  1 minute and 54 seconds.  8.0 mGy. PROCEDURE: The procedure, risks, benefits, and alternatives were explained to the patient's son by telephone. Questions regarding the procedure were encouraged and answered. The patient's son understands and consents to the procedure. A time-out was performed prior to initiating the procedure. Bilateral flank regions were prepped with chlorhexidine in  a sterile fashion, and a  sterile drape was applied covering the operative field. A sterile gown and sterile gloves were used for the procedure. Local anesthesia was provided with 1% Lidocaine. Ultrasound was used to localize the left kidney. Under direct ultrasound guidance, a 21 gauge needle was advanced into the renal collecting system. Ultrasound image documentation was performed. Aspiration of urine sample was performed followed by contrast injection under fluoroscopy. A transitional dilator was advanced over a guidewire. Percutaneous tract dilatation was then performed over the guidewire. A 10-French percutaneous nephrostomy tube was then advanced and formed in the left renal collecting system. Additional urine sample was aspirated and sent for culture analysis. Catheter position was confirmed by fluoroscopy after contrast injection. Ultrasound was then used to localize the right kidney. A 21 gauge needle was advanced under ultrasound guidance into the right renal collecting system and ultrasound image documentation performed. After aspiration of urine, contrast was injected under fluoroscopy. A transitional dilator was advanced over a guidewire under fluoroscopy. The tract was dilated and a 10 French percutaneous nephrostomy tube advanced and formed in the right renal collecting system. Catheter position was confirmed by fluoroscopy after contrast injection. Both nephrostomy tubes were secured at the skin with Prolene retention sutures and Stat-Lock devices. Both were connected to gravity drainage bags. COMPLICATIONS: None. FINDINGS: Ultrasound confirms the presence of significant hydronephrosis bilaterally. After access of the left renal collecting system and placement of a nephrostomy tube formed in the renal pelvis, there is return of grossly purulent appearing urine. A sample of purulent fluid was sent for culture analysis. After placement of the right nephrostomy tube which was also formed in the renal pelvis, there is return of  some blood tinged urine. IMPRESSION: Placement of bilateral 10 French percutaneous nephrostomy tubes formed at the level of the renal pelvis. Urine from the left kidney was grossly purulent and a sample sent for culture analysis. Both nephrostomy tubes were connected to gravity drainage bags. Electronically Signed   By: Aletta Edouard M.D.   On: 04/01/2020 14:20     Medical Consultants:    None.  Anti-Infectives:   Cefepime  Subjective:    Chase Saxon Sr. moaning and groaning able to protect his airways.  Objective:    Vitals:   04/02/20 0500 04/02/20 0540 04/02/20 0735 04/02/20 0810  BP: 110/61 128/64    Pulse: 73 64    Resp: 13 18    Temp: 99 F (37.2 C) 97.8 F (36.6 C) 99.2 F (37.3 C) 98.7 F (37.1 C)  TempSrc: Oral Oral Oral   SpO2: 100% 99%    Weight:  54 kg    Height:       SpO2: 99 % O2 Flow Rate (L/min): 10 L/min   Intake/Output Summary (Last 24 hours) at 04/02/2020 1025 Last data filed at 04/02/2020 0810 Gross per 24 hour  Intake 2756.9 ml  Output 640 ml  Net 2116.9 ml   Filed Weights   03/31/20 1402 04/01/20 0155 04/02/20 0540  Weight: 53.3 kg 53.4 kg 54 kg    Exam: General exam: In no acute distress. Respiratory system: Good air movement and clear to auscultation. Cardiovascular system: S1 & S2 heard, RRR. No JVD. Gastrointestinal system: Abdomen is nondistended, soft and nontender.  Extremities: No pedal edema. Skin: No rashes, lesions or ulcers   Data Reviewed:    Labs: Basic Metabolic Panel: Recent Labs  Lab 03/31/20 1420 04/01/20 0213 04/01/20 0841 04/02/20 0312  NA 133* 141 145 142  K  5.1 4.2 3.0* 3.0*  CL 108 108 106 102  CO2 <7* <7* 9* 18*  GLUCOSE 126* 88 76 75  BUN 136* 134* 133* 59*  CREATININE 16.69* 15.49* 15.34* 7.58*  CALCIUM 7.9* 7.3* 6.6* 6.8*  MG  --  2.2  --   --   PHOS  --  >30.0*  --  4.2   GFR Estimated Creatinine Clearance: 8.3 mL/min (A) (by C-G formula based on SCr of 7.58 mg/dL (H)). Liver  Function Tests: Recent Labs  Lab 03/31/20 1420  AST 38  ALT 25  ALKPHOS 146*  BILITOT 0.9  PROT 8.0  ALBUMIN 3.4*   No results for input(s): LIPASE, AMYLASE in the last 168 hours. No results for input(s): AMMONIA in the last 168 hours. Coagulation profile Recent Labs  Lab 03/31/20 1420  INR 1.5*   COVID-19 Labs  No results for input(s): DDIMER, FERRITIN, LDH, CRP in the last 72 hours.  Lab Results  Component Value Date   SARSCOV2NAA NEGATIVE 03/31/2020   SARSCOV2NAA NEGATIVE 01/07/2020   [place] NEGATIVE 12/24/2019   [place] NEGATIVE 11/19/2019    CBC: Recent Labs  Lab 03/31/20 1420 04/01/20 0213 04/02/20 0312  WBC 16.6* 13.4* 12.5*  NEUTROABS 15.1*  --   --   HGB 8.3* 7.0* 6.3*  HCT 24.7* 19.8* 18.1*  MCV 92.2 88.8 85.4  PLT 262 215 220   Cardiac Enzymes: No results for input(s): CKTOTAL, CKMB, CKMBINDEX, TROPONINI in the last 168 hours. BNP (last 3 results) No results for input(s): PROBNP in the last 8760 hours. CBG: Recent Labs  Lab 04/01/20 1708 04/01/20 2324 04/02/20 0525 04/02/20 0732  GLUCAP 79 75 78 77   D-Dimer: No results for input(s): DDIMER in the last 72 hours. Hgb A1c: No results for input(s): HGBA1C in the last 72 hours. Lipid Profile: No results for input(s): CHOL, HDL, LDLCALC, TRIG, CHOLHDL, LDLDIRECT in the last 72 hours. Thyroid function studies: No results for input(s): TSH, T4TOTAL, T3FREE, THYROIDAB in the last 72 hours.  Invalid input(s): FREET3 Anemia work up: No results for input(s): VITAMINB12, FOLATE, FERRITIN, TIBC, IRON, RETICCTPCT in the last 72 hours. Sepsis Labs: Recent Labs  Lab 03/31/20 1420 03/31/20 1641 04/01/20 0213 04/02/20 0312  WBC 16.6*  --  13.4* 12.5*  LATICACIDVEN 0.9 0.6  --   --    Microbiology Recent Results (from the past 240 hour(s))  Urine culture     Status: Abnormal   Collection Time: 03/31/20  2:07 PM   Specimen: In/Out Cath Urine  Result Value Ref Range Status   Specimen  Description   Final    IN/OUT CATH URINE Performed at [facility], [address] [place], [place] [address]    Special Requests   Final    NONE Performed at [facility], [address], [place], [place] [address]    Culture MULTIPLE SPECIES PRESENT, SUGGEST RECOLLECTION (A)  Final   Report Status 04/02/2020 FINAL  Final  Blood Culture (routine x 2)     Status: None (Preliminary result)   Collection Time: 03/31/20  2:20 PM   Specimen: BLOOD  Result Value Ref Range Status   Specimen Description BLOOD BLOOD LEFT FOREARM  Final   Special Requests   Final    BOTTLES DRAWN AEROBIC AND ANAEROBIC Blood Culture adequate volume   Culture   Final    NO GROWTH 2 DAYS Performed at [facility], [address] [place],  [address]    Report Status PENDING  Incomplete  Blood  Culture (routine x 2)     Status: None (Preliminary result)   Collection Time: 03/31/20  2:20 PM   Specimen: BLOOD  Result Value Ref Range Status   Specimen Description BLOOD LEFT ANTECUBITAL  Final   Special Requests   Final    BOTTLES DRAWN AEROBIC AND ANAEROBIC Blood Culture adequate volume   Culture   Final    NO GROWTH 2 DAYS Performed at [facility], [address], [place], [place] [address]    Report Status PENDING  Incomplete  Resp Panel by RT-PCR (Flu A&B, Covid) Nasopharyngeal Swab     Status: None   Collection Time: 03/31/20  2:48 PM   Specimen: Nasopharyngeal Swab; Nasopharyngeal(NP) swabs in vial transport medium  Result Value Ref Range Status   SARS Coronavirus 2 by RT PCR NEGATIVE NEGATIVE Final    Comment: (NOTE) SARS-CoV-2 target nucleic acids are NOT DETECTED.  The SARS-CoV-2 RNA is generally detectable in upper respiratory specimens during the acute phase of infection. The lowest concentration of SARS-CoV-2 viral copies this assay can detect is 138 copies/mL. A negative result does not preclude SARS-Cov-2 infection and should not be used as the sole basis for treatment or other patient  management decisions. A negative result may occur with  improper specimen collection/handling, submission of specimen other than nasopharyngeal swab, presence of viral mutation(s) within the areas targeted by this assay, and inadequate number of viral copies(<138 copies/mL). A negative result must be combined with clinical observations, patient history, and epidemiological information. The expected result is Negative.  Fact Sheet for Patients:  EntrepreneurPulse.com.au  Fact Sheet for Healthcare Providers:  IncredibleEmployment.be  This test is no t yet approved or cleared by the [place] FDA and  has been authorized for detection and/or diagnosis of SARS-CoV-2 by FDA under an Emergency Use Authorization (EUA). This EUA will remain  in effect (meaning this test can be used) for the duration of the COVID-19 declaration under Section 564(b)(1) of the Act, 21 U.S.C.section 360bbb-3(b)(1), unless the authorization is terminated  or revoked sooner.       Influenza A by PCR NEGATIVE NEGATIVE Final   Influenza B by PCR NEGATIVE NEGATIVE Final    Comment: (NOTE) The Xpert Xpress SARS-CoV-2/FLU/RSV plus assay is intended as an aid in the diagnosis of influenza from Nasopharyngeal swab specimens and should not be used as a sole basis for treatment. Nasal washings and aspirates are unacceptable for Xpert Xpress SARS-CoV-2/FLU/RSV testing.  Fact Sheet for Patients: EntrepreneurPulse.com.au  Fact Sheet for Healthcare Providers: IncredibleEmployment.be  This test is not yet approved or cleared by the [place] FDA and has been authorized for detection and/or diagnosis of SARS-CoV-2 by FDA under an Emergency Use Authorization (EUA). This EUA will remain in effect (meaning this test can be used) for the duration of the COVID-19 declaration under Section 564(b)(1) of the Act, 21 U.S.C. section 360bbb-3(b)(1),  unless the authorization is terminated or revoked.  Performed at [facility], [address] [place], [place] [address]   Urine Culture     Status: Abnormal   Collection Time: 03/31/20 10:27 PM   Specimen: In/Out Cath Urine  Result Value Ref Range Status   Specimen Description IN/OUT CATH URINE  Final   Special Requests   Final    NONE Performed at [facility], [place] [address] [place], [place] [address]    Culture MULTIPLE SPECIES PRESENT, SUGGEST RECOLLECTION (A)  Final   Report Status 04/02/2020 FINAL  Final  Urine culture  Status: Abnormal   Collection Time: 04/01/20  1:57 PM   Specimen: Urine, Random  Result Value Ref Range Status   Specimen Description URINE, RANDOM  Final   Special Requests   Final    LEFT KIDNEY Performed at [facility], [place] [address], [place], [place] [address]    Culture MULTIPLE SPECIES PRESENT, SUGGEST RECOLLECTION (A)  Final   Report Status 04/02/2020 FINAL  Final     Medications:   . Chlorhexidine Gluconate Cloth  6 each Topical Daily  . Chlorhexidine Gluconate Cloth  6 each Topical Q0600  . heparin  5,000 Units Subcutaneous Q8H  . heparin sodium (porcine)      . pantoprazole (PROTONIX) IV  40 mg Intravenous Q24H  . sodium chloride flush  5 mL Intracatheter Q8H   Continuous Infusions: . ceFEPime (MAXIPIME) IV 1 g (04/01/20 1658)  . lactated ringers with kcl 100 mL/hr at 04/02/20 0852      LOS: 2 days   Chase [name]  [facility]  04/02/2020, 10:25 AM

## 2020-04-02 NOTE — Progress Notes (Signed)
CRITICAL VALUE ALERT  Critical Value:  Hemoglobin 6.3  Date & Time Notied:  04/03/2019 0401  Provider Notified: Dr Myna Hidalgo  Orders Received/Actions taken: 1 unit PRBC, see order

## 2020-04-02 NOTE — Progress Notes (Signed)
   04/01/20 2315  Assess: MEWS Score  Temp (!) 97 F (36.1 C)  BP 100/60  Pulse Rate 88  ECG Heart Rate 87  Resp (!) 21  Level of Consciousness Alert  SpO2 (!) 74 %  O2 Device Nasal Cannula  O2 Flow Rate (L/min) 6 L/min  Assess: MEWS Score  MEWS Temp 0  MEWS Systolic 1  MEWS Pulse 0  MEWS RR 1  MEWS LOC 0  MEWS Score 2  MEWS Score Color Yellow  Escalate  MEWS: Escalate Yellow: discuss with charge nurse/RN and consider discussing with provider and RRT  Notify: Charge Nurse/RN  Name of Charge Nurse/RN Notified Caryl Pina  Date Charge Nurse/RN Notified 04/01/20  Time Charge Nurse/RN Notified 2315  Notify: Provider  Provider Name/Title Dr Myna Hidalgo  Date Provider Notified 04/01/20  Time Provider Notified 2320  Notification Type Page  Notification Reason Change in status  Response Other (Comment) (dr oPYD CAME TO SEE PT)  Date of Provider Response 04/02/20  Time of Provider Response 2335  Notify: Rapid Response  Name of Rapid Response RN Notified DAVE  Date Rapid Response Notified 04/01/20  Time Rapid Response Notified 2315  Document  Patient Outcome Stabilized after interventions  Progress note created (see row info) Yes  [place], place on NRB at 15L O2 then Care team decided HFNC at 15L.  Pt now sating 95% on HFNC

## 2020-04-02 NOTE — Progress Notes (Signed)
Referring Physician(s): Opyd, Ilene Qua [facility]); Reesa Chew (nephorlogy)  Supervising Physician: Jacqulynn Cadet  Patient Status:  [name] - In-pt  Chief Complaint: None- lethargic  Subjective:  Urinary obstruction secondary to pelvic mass with subsequent bilateral hydronephrosis in setting of bilateral ureteral stents, with bilateral PCNs inadvertently removed (unknown how long they have not been in place) s/p bilateral PCN placements in IR 04/01/2020. 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 Patient sitting in bed, lethargic, eyes not open, does not respond to voice, moaning. RN at bedside- states plans for transfer to ICU today. Bilateral PCN and left TG sites c/d/i.   Allergies: Nsaids and Ciprofloxacin  Medications: Prior to Admission medications   Medication Sig Start Date End Date Taking? Authorizing Provider  Cholecalciferol (VITAMIN D3) 20 MCG (800 UNIT) TABS Take 800 Units by mouth daily.    [provider]  cyanocobalamin (,VITAMIN B-12,) 1000 MCG/ML injection Inject 1,000 mcg into the muscle every 30 (thirty) days.  12/28/13   [provider]  DULoxetine (CYMBALTA) 30 MG capsule Take 30-60 mg by mouth See admin instructions. Take 60 mg in the morning and 30 mg in the evening    [provider]  gabapentin (NEURONTIN) 100 MG capsule Take 2 capsules (200 mg total) by mouth daily. Patient taking differently: Take 800 mg by mouth 3 (three) times daily.  01/09/20 03/03/20  Elodia Florence., MD  hydrOXYzine (ATARAX/VISTARIL) 10 MG tablet Take 1 tablet (10 mg total) by mouth 3 (three) times daily as needed for itching or anxiety. Patient taking differently: Take 10 mg by mouth 3 (three) times daily as needed for anxiety.  10/26/19   Cristal Ford, DO  Multiple Vitamin (MULTIVITAMIN WITH MINERALS) TABS tablet Take 1 tablet by mouth daily. 10/27/19   Cristal Ford, DO   oxyCODONE-acetaminophen (PERCOCET/ROXICET) 5-325 MG tablet Take by mouth every 4 (four) hours as needed for severe pain.    [provider]  pantoprazole (PROTONIX) 20 MG tablet Take 20 mg by mouth 2 (two) times daily.    [provider]  peg 3350 powder (MOVIPREP) 100 g SOLR Take 1 kit (200 g total) by mouth as directed. 03/03/20   Gatha Mayer, MD  prazosin (MINIPRESS) 1 MG capsule Take 1 mg by mouth at bedtime.    [provider]  sevelamer carbonate (RENVELA) 800 MG tablet Take 800 mg by mouth 3 (three) times daily with meals.    [provider]  sodium bicarbonate 650 MG tablet Take 650 mg by mouth 2 (two) times daily.    [provider]  tamsulosin (FLOMAX) 0.4 MG CAPS capsule Take 0.4 mg by mouth at bedtime.     [provider]     Vital Signs: BP 128/64   Pulse 64   Temp 98.7 F (37.1 C)   Resp 18   Ht _0  (1.676 m)   Wt 119 lb 0.8 oz (54 kg)   SpO2 99%   BMI 19.21 kg/m   Physical Exam Constitutional:      General: He is not in acute distress.    Appearance: He is ill-appearing.     Comments: Lethargic.  Pulmonary:     Effort: Pulmonary effort is normal. No respiratory distress.  Abdominal:     Comments: Left TG drain site without erythema, drainage, or active bleeding; approximately 10 cc dark brown fluid in suction bulb.  Genitourinary:    Comments: Bilateral PCN  sites without erythema, drainage, or active bleeding; Right PCN with approximately 50 cc clear yellow urine in gravity bag; Left PCN with approximately 50 cc clear gold urine in gravity bag. Skin:    General: Skin is warm and dry.     Imaging: IR Fluoro Guide CV Line Right  Result Date: 04/01/2020 CLINICAL DATA:  Renal failure and need for emergent hemodialysis. EXAM: NON-TUNNELED CENTRAL VENOUS HEMODIALYSIS CATHETER PLACEMENT WITH ULTRASOUND AND FLUOROSCOPIC GUIDANCE FLUOROSCOPY TIME:  30 seconds. PROCEDURE: The procedure, risks, benefits, and  alternatives were explained to the patient's son. Questions regarding the procedure were encouraged and answered. The patient's son understands and consents to the procedure. A time-out was performed prior to initiating the procedure. Ultrasound was used to confirm patency of the right internal jugular vein. The right neck and chest were prepped with chlorhexidine in a sterile fashion, and a sterile drape was applied covering the operative field. Maximum barrier sterile technique with sterile gowns and gloves were used for the procedure. Local anesthesia was provided with 1% lidocaine. After creating a small venotomy incision, a 21 gauge needle was advanced into the right internal jugular vein under direct, real-time ultrasound guidance. Ultrasound image documentation was performed. After securing guidewire access venous access was dilated over a guidewire. A 16 cm, 13 French triple-lumen Mahurkar non tunneled hemodialysis catheter was advanced over the wire. Final catheter positioning was confirmed and documented with a fluoroscopic spot image. The catheter was aspirated, flushed with saline, and injected with appropriate volume heparin dwells. The catheter exit site was secured with 0-Prolene retention sutures. COMPLICATIONS: None.  No pneumothorax. FINDINGS: After catheter placement, the tip lies at the cavoatrial junction. The catheter aspirates normally and is ready for immediate use. IMPRESSION: Placement of non-tunneled central venous hemodialysis catheter via the right internal jugular vein. The catheter tip lies at the cavoatrial junction. The catheter is ready for immediate use. Electronically Signed   By: Aletta Edouard M.D.   On: 04/01/2020 14:26   IR US Guide Vasc Access Right  Result Date: 04/01/2020 CLINICAL DATA:  Renal failure and need for emergent hemodialysis. EXAM: NON-TUNNELED CENTRAL VENOUS HEMODIALYSIS CATHETER PLACEMENT WITH ULTRASOUND AND FLUOROSCOPIC GUIDANCE FLUOROSCOPY TIME:  30  seconds. PROCEDURE: The procedure, risks, benefits, and alternatives were explained to the patient's son. Questions regarding the procedure were encouraged and answered. The patient's son understands and consents to the procedure. A time-out was performed prior to initiating the procedure. Ultrasound was used to confirm patency of the right internal jugular vein. The right neck and chest were prepped with chlorhexidine in a sterile fashion, and a sterile drape was applied covering the operative field. Maximum barrier sterile technique with sterile gowns and gloves were used for the procedure. Local anesthesia was provided with 1% lidocaine. After creating a small venotomy incision, a 21 gauge needle was advanced into the right internal jugular vein under direct, real-time ultrasound guidance. Ultrasound image documentation was performed. After securing guidewire access venous access was dilated over a guidewire. A 16 cm, 13 French triple-lumen Mahurkar non tunneled hemodialysis catheter was advanced over the wire. Final catheter positioning was confirmed and documented with a fluoroscopic spot image. The catheter was aspirated, flushed with saline, and injected with appropriate volume heparin dwells. The catheter exit site was secured with 0-Prolene retention sutures. COMPLICATIONS: None.  No pneumothorax. FINDINGS: After catheter placement, the tip lies at the cavoatrial junction. The catheter aspirates normally and is ready for immediate use. IMPRESSION: Placement of non-tunneled central venous hemodialysis catheter via  the right internal jugular vein. The catheter tip lies at the cavoatrial junction. The catheter is ready for immediate use. Electronically Signed   By: Aletta Edouard M.D.   On: 04/01/2020 14:26   DG CHEST PORT 1 VIEW  Result Date: 04/02/2020 CLINICAL DATA:  Evaluate for pulmonary pathology. EXAM: PORTABLE CHEST 1 VIEW COMPARISON:  April 01, 2020 11:40 p.m. FINDINGS: The mediastinal contour  and cardiac silhouette are stable. Right central venous line is identified with distal tip in the superior vena cava/right atrium. Consolidation of medial right lung base is identified. The left lung is clear. The bony structures are stable. IMPRESSION: Right lung base pneumonia. Electronically Signed   By: Abelardo Diesel M.D.   On: 04/02/2020 08:21   DG Chest Port 1 View  Result Date: 04/01/2020 CLINICAL DATA:  Acute respiratory distress EXAM: PORTABLE CHEST 1 VIEW COMPARISON:  03/31/2020 FINDINGS: Elevation of the right hemidiaphragm again noted with associated right basilar atelectasis. Left lung clear. No pneumothorax or pleural effusion. Right internal jugular central venous catheter tip noted at the superior cavoatrial junction. Cardiac size within normal limits. Pulmonary vascularity is normal. No acute bone abnormality. IMPRESSION: No active disease. Electronically Signed   By: Fidela Salisbury MD   On: 04/01/2020 23:48   DG Chest Port 1 View  Result Date: 03/31/2020 CLINICAL DATA:  Altered mental status, questionable sepsis, pending COVID test. EXAM: PORTABLE CHEST 1 VIEW COMPARISON:  Chest x-ray dated 11/08/2019. FINDINGS: Heart size and mediastinal contours are within normal limits. Lungs appear clear. No pleural effusion or pneumothorax is seen. Osseous structures about the chest are unremarkable. IMPRESSION: No active disease. No evidence of pneumonia. Electronically Signed   By: Franki Cabot M.D.   On: 03/31/2020 14:36   CT Renal Stone Study  Result Date: 03/31/2020 CLINICAL DATA:  Flank pain EXAM: CT ABDOMEN AND PELVIS WITHOUT CONTRAST TECHNIQUE: Multidetector CT imaging of the abdomen and pelvis was performed following the standard protocol without IV contrast. COMPARISON:  12/30/2019, 01/26/2020, 11/14/2019, 12/24/2019 FINDINGS: Lower chest: Lung bases demonstrate no acute consolidation or pleural effusion. Normal cardiac size. Hepatobiliary: Status post cholecystectomy. No focal hepatic  abnormality. No biliary dilatation. Pancreas: Unremarkable. No pancreatic ductal dilatation or surrounding inflammatory changes. Spleen: Normal in size without focal abnormality. Adrenals/Urinary Tract: Adrenal glands within normal limits. Bilateral percutaneous nephrostomy catheters has been removed. Linear soft tissue tract is visible posteriorly. Bilateral ureteral stents are visualized, proximal pigtail on the right is within extrarenal pelvis, distal pigtail within the posterior bladder. Proximal pigtail of left ureteral stent is visible in the proximal left ureter, the distal portion of the stent is in the urinary bladder. Moderate bilateral hydronephrosis which is increased compared to the previous CT. Multiple small foci of gas within the bilateral renal collecting systems which may be due to refluxed air from the bladder versus recent removal of percutaneous nephrostomy tubes. Moderate perinephric fat stranding. Foley catheter within the bladder. Small air within the urinary bladder which appears slightly thick-walled. Stomach/Bowel: The stomach is nonenlarged. Evidence of prior gastric bypass. No dilated small bowel. Large amount of stool in the colon. Negative appendix. No acute bowel wall thickening. Vascular/Lymphatic: Mild aortic atherosclerosis without aneurysm. No suspicious nodes. Reproductive: Slightly enlarged prostate Other: No free air or free fluid. Left trans gluteal percutaneous drainage catheter with pigtail visible in the left posterior pelvis. No significant fluid collections visible within the pelvis at this time. Musculoskeletal: No acute or suspicious osseous abnormality. IMPRESSION: 1. Bilateral percutaneous nephrostomy catheters have been removed.  Interim finding of moderate bilateral hydronephrosis of extrarenal pelvises, increased compared to prior CT. Right ureteral stent in expected position. Proximal pigtail of the left ureteral stent is visible within the proximal left ureter.  Foley catheter present within decompressed urinary bladder; bladder wall appears slightly thickened. 2. Left trans gluteal percutaneous drainage catheter with pigtail visible in the left posterior pelvis. No significant fluid collections visible within the pelvis at this time. 3. Large amount of stool in the colon, suggesting constipation. Aortic Atherosclerosis (ICD10-I70.0). Electronically Signed   By: Donavan Foil M.D.   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 Status post prior left ureteral stent placement in September and right ureteral stent placement in October. Admitted with severe acute kidney injury superimposed on chronic kidney disease, lack of urine output and evidence of bilateral hydronephrosis by imaging. An indwelling right nephrostomy tube has been pulled out for unknown length of time. He now presents for new bilateral percutaneous nephrostomy tube placement for renal decompression to determine degree of recovery will renal function. EXAM: 1. LEFT PERCUTANEOUS NEPHROSTOMY TUBE PLACEMENT 2. RIGHT PERCUTANEOUS NEPHROSTOMY TUBE PLACEMENT COMPARISON:  CT of the abdomen and pelvis on 03/31/2020 ANESTHESIA/SEDATION: Moderate (conscious) sedation was employed during this procedure. A total of Versed 1.0 mg and Fentanyl 50 mcg was administered intravenously. Moderate Sedation Time: 27 minutes. The patient's level of consciousness and vital signs were monitored continuously by radiology nursing throughout the procedure under my direct supervision. CONTRAST:  15 mL Omnipaque 300 MEDICATIONS: 1 g IV Rocephin. Antibiotic was administered in an appropriate time frame prior to skin puncture. FLUOROSCOPY TIME:  1 minute and 54 seconds.  8.0 mGy. PROCEDURE: The procedure, risks, benefits, and  alternatives were explained to the patient's son by telephone. Questions regarding the procedure were encouraged and answered. The patient's son understands and consents to the procedure. A time-out was performed prior to initiating the procedure. Bilateral flank regions were prepped with chlorhexidine in a sterile fashion, and a sterile drape was applied covering the operative field. A sterile gown and sterile gloves were used for the procedure. Local anesthesia was provided with 1% Lidocaine. Ultrasound was used to localize the left kidney. Under direct ultrasound guidance, a 21 gauge needle was advanced into the renal collecting system. Ultrasound image documentation was performed. Aspiration of urine sample was performed followed by contrast injection under fluoroscopy. A transitional dilator was advanced over a guidewire. Percutaneous tract dilatation was then performed over the guidewire. A 10-French percutaneous nephrostomy tube was then advanced and formed in the left renal collecting system. Additional urine sample was aspirated and sent for culture analysis. Catheter position was confirmed by fluoroscopy after contrast injection. Ultrasound was then used to localize the right kidney. A 21 gauge needle was advanced under ultrasound guidance into the right renal collecting system and ultrasound image documentation performed. After aspiration of urine, contrast was injected under fluoroscopy. A transitional dilator was advanced over a guidewire under fluoroscopy. The tract was dilated and a 10 French percutaneous nephrostomy tube advanced and formed in the right renal collecting system. Catheter position was confirmed by fluoroscopy after contrast injection. Both nephrostomy tubes were secured at the skin with Prolene retention sutures and Stat-Lock devices. Both were connected to gravity drainage bags. COMPLICATIONS: None. FINDINGS: Ultrasound confirms the presence of significant hydronephrosis bilaterally.  After access of the left renal  collecting system and placement of a nephrostomy tube formed in the renal pelvis, there is return of grossly purulent appearing urine. A sample of purulent fluid was sent for culture analysis. After placement of the right nephrostomy tube which was also formed in the renal pelvis, there is return of some blood tinged urine. IMPRESSION: Placement of bilateral 10 French percutaneous nephrostomy tubes formed at the level of the renal pelvis. Urine from the left kidney was grossly purulent and a sample sent for culture analysis. Both nephrostomy tubes were connected to gravity drainage bags. Electronically Signed   By: Aletta Edouard M.D.   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 Status post prior left ureteral stent placement in September and right ureteral stent placement in October. Admitted with severe acute kidney injury superimposed on chronic kidney disease, lack of urine output and evidence of bilateral hydronephrosis by imaging. An indwelling right nephrostomy tube has been pulled out for unknown length of time. He now presents for new bilateral percutaneous nephrostomy tube placement for renal decompression to determine degree of recovery will renal function. EXAM: 1. LEFT PERCUTANEOUS NEPHROSTOMY TUBE PLACEMENT 2. RIGHT PERCUTANEOUS NEPHROSTOMY TUBE PLACEMENT COMPARISON:  CT of the abdomen and pelvis on 03/31/2020 ANESTHESIA/SEDATION: Moderate (conscious) sedation was employed during this procedure. A total of Versed 1.0 mg and Fentanyl 50 mcg was administered intravenously. Moderate Sedation Time: 27 minutes. The patient's level of consciousness and vital signs were monitored continuously by radiology nursing throughout the procedure under my direct supervision.  CONTRAST:  15 mL Omnipaque 300 MEDICATIONS: 1 g IV Rocephin. Antibiotic was administered in an appropriate time frame prior to skin puncture. FLUOROSCOPY TIME:  1 minute and 54 seconds.  8.0 mGy. PROCEDURE: The procedure, risks, benefits, and alternatives were explained to the patient's son by telephone. Questions regarding the procedure were encouraged and answered. The patient's son understands and consents to the procedure. A time-out was performed prior to initiating the procedure. Bilateral flank regions were prepped with chlorhexidine in a sterile fashion, and a sterile drape was applied covering the operative field. A sterile gown and sterile gloves were used for the procedure. Local anesthesia was provided with 1% Lidocaine. Ultrasound was used to localize the left kidney. Under direct ultrasound guidance, a 21 gauge needle was advanced into the renal collecting system. Ultrasound image documentation was performed. Aspiration of urine sample was performed followed by contrast injection under fluoroscopy. A transitional dilator was advanced over a guidewire. Percutaneous tract dilatation was then performed over the guidewire. A 10-French percutaneous nephrostomy tube was then advanced and formed in the left renal collecting system. Additional urine sample was aspirated and sent for culture analysis. Catheter position was confirmed by fluoroscopy after contrast injection. Ultrasound was then used to localize the right kidney. A 21 gauge needle was advanced under ultrasound guidance into the right renal collecting system and ultrasound image documentation performed. After aspiration of urine, contrast was injected under fluoroscopy. A transitional dilator was advanced over a guidewire under fluoroscopy. The tract was dilated and a 10 French percutaneous nephrostomy tube advanced and formed in the right renal collecting system. Catheter position was confirmed by fluoroscopy after contrast injection. Both  nephrostomy tubes were secured at the skin with Prolene retention sutures and Stat-Lock devices. Both were connected to gravity drainage bags. COMPLICATIONS: None. FINDINGS: Ultrasound confirms the presence of  significant hydronephrosis bilaterally. After access of the left renal collecting system and placement of a nephrostomy tube formed in the renal pelvis, there is return of grossly purulent appearing urine. A sample of purulent fluid was sent for culture analysis. After placement of the right nephrostomy tube which was also formed in the renal pelvis, there is return of some blood tinged urine. IMPRESSION: Placement of bilateral 10 French percutaneous nephrostomy tubes formed at the level of the renal pelvis. Urine from the left kidney was grossly purulent and a sample sent for culture analysis. Both nephrostomy tubes were connected to gravity drainage bags. Electronically Signed   By: Aletta Edouard M.D.   On: 04/01/2020 14:20    Labs:  CBC: Recent Labs    01/26/20 1050 03/31/20 1420 04/01/20 0213 04/02/20 0312  WBC 6.8 16.6* 13.4* 12.5*  HGB 9.4* 8.3* 7.0* 6.3*  HCT 30.3* 24.7* 19.8* 18.1*  PLT 225 262 215 220    COAGS: Recent Labs    10/17/19 1241 10/17/19 1311 11/07/19 1836 11/07/19 1853 11/10/19 1052 12/08/19 1258 12/27/19 0450 01/26/20 1050 03/31/20 1420  INR 1.5* 1.6*   < >  --    < > 1.1 1.3* 1.2 1.5*  APTT 43* 46*  --  31  --   --   --   --  37*   < > = values in this interval not displayed.    BMP: Recent Labs    01/01/20 0430 01/02/20 0427 01/03/20 0506 01/04/20 0453 01/05/20 0558 03/31/20 1420 04/01/20 0213 04/01/20 0841 04/02/20 0312  NA 143 144 143 143   < > 133* 141 145 142  K 3.5 3.9 3.9 3.9   < > 5.1 4.2 3.0* 3.0*  CL 116* 113* 112* 112*   < > 108 108 106 102  CO2 18* 19* 22 23   < > <7* <7* 9* 18*  GLUCOSE 73 84 81 77   < > 126* 88 76 75  BUN 60* 61* 53* 47*   < > 136* 134* 133* 59*  CALCIUM 7.3* 7.5* 7.5* 7.4*   < > 7.9* 7.3* 6.6* 6.8*   CREATININE 6.54* 6.27* 6.42* 5.74*   < > 16.69* 15.49* 15.34* 7.58*  GFRNONAA 9* 9* 9* 10*   < > 3* 3* 3* 8*  GFRAA 10* 11* 10* 12*  --   --   --   --   --    < > = values in this interval not displayed.    LIVER FUNCTION TESTS: Recent Labs    12/28/19 0503 12/30/19 0603 01/06/20 0507 01/07/20 0449 01/08/20 0518 03/31/20 1420  BILITOT 0.3  --   --  0.3 0.7 0.9  AST 28  --   --  37 26 38  ALT 8  --   --  _0 ALKPHOS 87  --   --  86 81 146*  PROT 6.0*  --   --  5.7* 5.6* 8.0  ALBUMIN 2.2*   < > 1.9* 2.0*  2.0* 2.0* 3.4*   < > = values in this interval not displayed.    Assessment and Plan:  Urinary obstruction secondary to pelvic mass with subsequent bilateral hydronephrosis in setting of bilateral ureteral stents, with bilateral PCNs inadvertently removed (unknown how long they have not been in place) s/p bilateral PCN placements in IR 04/01/2020. Right PCN stable with approximately 50 cc clear yellow urine in gravity bag (additional 300 cc output from drain in past 24 hours per  chart). Left PCN stable with approximately 50 cc clear gold urine in gravity bag (additional 150 cc output from drain in past 24 hours per chart). Continue current PCN management- continue with Qshift flushes/monitor of output; further PCN management per urology.  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 Left TG drain stable with approximately 10 cc dark brown fluid in suction bulb. Continue current drain management- continue with QD flushes and Qshift monitor of output. Given evidence of colovesical fistula, drain will likely remain in place until time of definitive surgery.  Further plans per [facility]/CCM/urology/nephrology- appreciate and agree with management. IR to follow.   Electronically Signed: Earley Abide, PA-C 04/02/2020, 9:03 AM   I spent a total of 25 Minutes at the the patient's bedside AND on the patient's hospital floor or  unit, greater than 50% of which was counseling/coordinating care for urinary obstruction s/p bilateral PCN placements AND pelvic abscess with fistula s/p left TG drain placement.

## 2020-04-02 NOTE — Progress Notes (Signed)
NAME:  Chase Kersh., MRN:  [id number], DOB:  Oct 08, 1965, LOS: 2 ADMISSION DATE:  03/31/2020, CONSULTATION DATE:  04/02/19 REFERRING MD: Charlynne Cousins, MD CHIEF COMPLAINT: Hypoxemia  Brief History:  Chronically ill 55 year old admitted with renal failure who had new/worsening hypoxemia after returning from dialysis unit (did not receive dialysis).  History of Present Illness:  Patient unable to provide history given encephalopathy.  He was admitted a day or so ago with worsening renal failure, encephalopathy.  Creatinine elevated at 15.  Some element obstruction per nephrology note.  Has chronic neph tubes that were not functioning.  Had dialysis catheter placed earlier that day with planned dialysis this evening.  At dialysis he was noted to be hypotensive per report so dialysis was not even attempted.  Sent back to the floor.  Upon arrival, noted to be hypoxemic to the 70s with a good waveform on bedside monitor.  Prior to going to dialysis, patient was on room air to 2 L nasal cannula satting appropriately.  Placed on nonrebreather with slow improvement in saturations specifically when probe was changed to different location.  Chest x-ray obtained which was clear on my interpretation.  On exam he has clear rattly secretions in the posterior oropharynx.  O2 saturations improved with deep NT suctioning.  When leaving the room, patient was satting low 90s on 6 L nasal cannula, he was increased to large bore nasal cannula with hopes to wean in the coming hours..  Past Medical History:  Lymphoma, CKD,   Significant Hospital Events:  Admitted 03/31/2020, rapid response and PCCM consult 04/01/2020  Consults:  Nephrology, PCCM, interventional radiology  Procedures:  Dialysis catheter 04/02/2019  Significant Diagnostic Tests:  Chest x-ray 04/01/2020 with clear lungs, elevated right hemidiaphragm on my interpretation  Micro Data:  Culture data pending  Antimicrobials:  Cefepime  1/1  Interim History / Subjective:  1/1: weaned from supplemental oxygen on RA and sats 98%  Objective   Blood pressure 128/64, pulse 64, temperature 98.7 F (37.1 C), resp. rate 18, height 5\' 6"  (1.676 m), weight 54 kg, SpO2 99 %.        Intake/Output Summary (Last 24 hours) at 04/02/2020 1059 Last data filed at 04/02/2020 0810 Gross per 24 hour  Intake 2756.9 ml  Output 640 ml  Net 2116.9 ml   Filed Weights   03/31/20 1402 04/01/20 0155 04/02/20 0540  Weight: 53.3 kg 53.4 kg 54 kg    Examination: General: chronically ill appearing, reclining comfortably in bed Lungs: CTAB, no wheeze Cardiovascular: RRR, no murmur Abdomen: ND, NT Extremities: warm, thin Neuro: moans, does not follow commands, briefly attends  Resolved Hospital Problem list   n/a  Assessment & Plan:  Acute Hypoxemic Respiratory failure: CXR without focal infiltrate Possible aspiration pneumonitis given secretions in OP vs obstructed upper airway. Sats improved with NT suction. --NT suction PRN  Toxic Metabolic encephalopathy: sepsis from UTI, renal failure, uremia. -per renal --Delirium precautions --Avoid centrally acting meds   Anemia:  -transfuse <7  Best practice (evaluated daily)  Per primary  Goals of Care:  Per primary, palliative care following  Pt improved from CCM perspective we will sign off at this time. Please call with any further quetsions.   Labs   CBC: Recent Labs  Lab 03/31/20 1420 04/01/20 0213 04/02/20 0312  WBC 16.6* 13.4* 12.5*  NEUTROABS 15.1*  --   --   HGB 8.3* 7.0* 6.3*  HCT 24.7* 19.8* 18.1*  MCV 92.2 88.8 85.4  PLT  262 215 728    Basic Metabolic Panel: Recent Labs  Lab 03/31/20 1420 04/01/20 0213 04/01/20 0841 04/02/20 0312  NA 133* 141 145 142  K 5.1 4.2 3.0* 3.0*  CL 108 108 106 102  CO2 <7* <7* 9* 18*  GLUCOSE 126* 88 76 75  BUN 136* 134* 133* 59*  CREATININE 16.69* 15.49* 15.34* 7.58*  CALCIUM 7.9* 7.3* 6.6* 6.8*  MG  --  2.2  --   --    PHOS  --  >30.0*  --  4.2   GFR: Estimated Creatinine Clearance: 8.3 mL/min (A) (by C-G formula based on SCr of 7.58 mg/dL (H)). Recent Labs  Lab 03/31/20 1420 03/31/20 1641 04/01/20 0213 04/02/20 0312  WBC 16.6*  --  13.4* 12.5*  LATICACIDVEN 0.9 0.6  --   --     Liver Function Tests: Recent Labs  Lab 03/31/20 1420  AST 38  ALT 25  ALKPHOS 146*  BILITOT 0.9  PROT 8.0  ALBUMIN 3.4*   No results for input(s): LIPASE, AMYLASE in the last 168 hours. No results for input(s): AMMONIA in the last 168 hours.  ABG    Component Value Date/Time   PHART 7.452 (H) 04/02/2020 0020   PCO2ART 26.2 (L) 04/02/2020 0020   PO2ART 44.3 (L) 04/02/2020 0020   HCO3 18.1 (L) 04/02/2020 0020   TCO2 15 (L) 10/17/2019 1241   ACIDBASEDEF 5.2 (H) 04/02/2020 0020   O2SAT 82.3 04/02/2020 0020     Coagulation Profile: Recent Labs  Lab 03/31/20 1420  INR 1.5*    Cardiac Enzymes: No results for input(s): CKTOTAL, CKMB, CKMBINDEX, TROPONINI in the last 168 hours.  HbA1C: Hgb A1c MFr Bld  Date/Time Value Ref Range Status  10/18/2019 12:13 AM 5.4 4.8 - 5.6 % Final    Comment:    (NOTE) Pre diabetes:          5.7%-6.4%  Diabetes:              >6.4%  Glycemic control for   <7.0% adults with diabetes     CBG: Recent Labs  Lab 04/01/20 1708 04/01/20 2324 04/02/20 0525 04/02/20 0732  GLUCAP 79 75 78 77    care time:  care time 25 mins. This represents my time independent of the NPs time taking care of the pt. This is excluding procedures.    Audria Nine DO [facility] 04/02/2020, 11:00 AM

## 2020-04-02 NOTE — Consult Note (Signed)
NAME:  Chase [name]., MRN:  [id number], DOB:  1966/03/29, LOS: 2 ADMISSION DATE:  03/31/2020, CONSULTATION DATE:  04/02/19 REFERRING MD: Charlynne Cousins, MD CHIEF COMPLAINT: Hypoxemia  Brief History:  Chronically ill 55 year old admitted with renal failure who had new/worsening hypoxemia after returning from dialysis unit (did not receive dialysis).  History of Present Illness:  Patient unable to provide history given encephalopathy.  He was admitted a day or so ago with worsening renal failure, encephalopathy.  Creatinine elevated at 15.  Some element obstruction per nephrology note.  Has chronic neph tubes that were not functioning.  Had dialysis catheter placed earlier that day with planned dialysis this evening.  At dialysis he was noted to be hypotensive per report so dialysis was not even attempted.  Sent back to the floor.  Upon arrival, noted to be hypoxemic to the 70s with a good waveform on bedside monitor.  Prior to going to dialysis, patient was on room air to 2 L nasal cannula satting appropriately.  Placed on nonrebreather with slow improvement in saturations specifically when probe was changed to different location.  Chest x-ray obtained which was clear on my interpretation.  On exam he has clear rattly secretions in the posterior oropharynx.  O2 saturations improved with deep NT suctioning.  When leaving the room, patient was satting low 90s on 6 L nasal cannula, he was increased to large bore nasal cannula with hopes to wean in the coming hours..  Past Medical History:  Lymphoma, CKD,   Significant Hospital Events:  Admitted 03/31/2020, rapid response and PCCM consult 04/01/2020  Consults:  Nephrology, PCCM, interventional radiology  Procedures:  Dialysis catheter 04/02/2019  Significant Diagnostic Tests:  Chest x-ray 04/01/2020 with clear lungs, elevated right hemidiaphragm on my interpretation  Micro Data:  Culture data pending  Antimicrobials:  Cefepime  1/1  Interim History / Subjective:  As above  Objective   Blood pressure (!) 119/58, pulse 74, temperature 97.8 F (36.6 C), temperature source Oral, resp. rate 19, height 5' 6"  (1.676 m), weight 53.4 kg, SpO2 90 %.        Intake/Output Summary (Last 24 hours) at 04/02/2020 0028 Last data filed at 04/01/2020 2234 Gross per 24 hour  Intake 2063.57 ml  Output 740 ml  Net 1323.57 ml   Filed Weights   03/31/20 1402 04/01/20 0155  Weight: 53.3 kg 53.4 kg    Examination: General: chronically ill appearing, sitting in bed Lungs: CTAB, no wheeze Cardiovascular: RRR, no murmur Abdomen: ND, NT Extremities: warm, thin Neuro: moans, does not follow commands, briefly attends  Resolved Hospital Problem list   n/a  Assessment & Plan:  Acute Hypoxemic Respiratory failure: CXR clear. Possible aspiration pneumonitis given secretions in OP vs obstructed upper airway. Sats improved with NT suction. --NT suction PRN --Needs dialysis to clear presumed uremic encephalopathy so can clear secretions --If hypoxemia not improving in the coming hours, would recommend CTA PE protocol to evaluate for PE given clear chest x-ray  Toxic Metabolic encephalopathy: sepsis from UTI, renal failure, uremia. --Needs HD --Delirium precautions --Avoid centrally acting meds   Best practice (evaluated daily)  Per primary  Goals of Care:  Per primary, palliative care following  Labs   CBC: Recent Labs  Lab 03/31/20 1420 04/01/20 0213  WBC 16.6* 13.4*  NEUTROABS 15.1*  --   HGB 8.3* 7.0*  HCT 24.7* 19.8*  MCV 92.2 88.8  PLT 262 505    Basic Metabolic Panel: Recent Labs  Lab 03/31/20  1420 04/01/20 0213 04/01/20 0841  NA 133* 141 145  K 5.1 4.2 3.0*  CL 108 108 106  CO2 <7* <7* 9*  GLUCOSE 126* 88 76  BUN 136* 134* 133*  CREATININE 16.69* 15.49* 15.34*  CALCIUM 7.9* 7.3* 6.6*  MG  --  2.2  --   PHOS  --  >30.0*  --    GFR: Estimated Creatinine Clearance: 4.1 mL/min (A) (by C-G formula  based on SCr of 15.34 mg/dL (H)). Recent Labs  Lab 03/31/20 1420 03/31/20 1641 04/01/20 0213  WBC 16.6*  --  13.4*  LATICACIDVEN 0.9 0.6  --     Liver Function Tests: Recent Labs  Lab 03/31/20 1420  AST 38  ALT 25  ALKPHOS 146*  BILITOT 0.9  PROT 8.0  ALBUMIN 3.4*   No results for input(s): LIPASE, AMYLASE in the last 168 hours. No results for input(s): AMMONIA in the last 168 hours.  ABG    Component Value Date/Time   HCO3 4.8 (L) 04/01/2020 0209   TCO2 15 (L) 10/17/2019 1241   ACIDBASEDEF 23.9 (H) 04/01/2020 0209   O2SAT 92.3 04/01/2020 0209     Coagulation Profile: Recent Labs  Lab 03/31/20 1420  INR 1.5*    Cardiac Enzymes: No results for input(s): CKTOTAL, CKMB, CKMBINDEX, TROPONINI in the last 168 hours.  HbA1C: Hgb A1c MFr Bld  Date/Time Value Ref Range Status  10/18/2019 12:13 AM 5.4 4.8 - 5.6 % Final    Comment:    (NOTE) Pre diabetes:          5.7%-6.4%  Diabetes:              >6.4%  Glycemic control for   <7.0% adults with diabetes     CBG: Recent Labs  Lab 04/01/20 1708 04/01/20 2324  GLUCAP 79 75    Review of Systems:   Unable to obtain due to encephalopathy.  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   Surgical History:   Past Surgical History:  Procedure Laterality Date  . COLONOSCOPY    . CYSTOSCOPY W/ URETERAL STENT PLACEMENT N/A 11/14/2019   Procedure: CYSTOSCOPY WITH RETROGRADE  PYELOGRAM bilateral Wyvonnia Dusky STENT PLACEMENT left fulguration bladder . cystogram;  Surgeon: Raynelle Bring, MD;  Location: [facility];  Service: Urology;  Laterality: N/A;  . CYSTOSCOPY W/ URETERAL STENT PLACEMENT Bilateral 12/25/2019   Procedure: CYSTOSCOPY WITH bilateral  RETROGRADE PYELOGRAM/ leftURETERAL STENT PLACEMENT;  Surgeon: Lucas Mallow, MD;  Location: [facility];  Service: Urology;  Laterality: Bilateral;  . GASTRIC BYPASS    . HERNIA REPAIR    . IR FLUORO GUIDE CV LINE RIGHT  04/01/2020  . IR NEPHROSTOMY PLACEMENT LEFT  12/27/2019  . IR NEPHROSTOMY PLACEMENT LEFT  04/01/2020  . IR NEPHROSTOMY PLACEMENT RIGHT  11/16/2019  . IR NEPHROSTOMY PLACEMENT RIGHT  12/27/2019  . IR NEPHROSTOMY PLACEMENT RIGHT  04/01/2020  . IR SINUS/FIST TUBE CHK-NON GI  12/03/2019  . IR URETERAL STENT PLACEMENT EXISTING ACCESS RIGHT  12/08/2019  . IR URETERAL STENT PLACEMENT EXISTING ACCESS RIGHT  01/26/2020  . IR US GUIDE VASC ACCESS RIGHT  04/01/2020     Social History:   reports that he has never smoked. He uses smokeless tobacco. He reports that he does not drink alcohol and does not use drugs.   Family History:  His family history includes Colon cancer in his maternal grandmother and paternal uncle; Heart disease in his father and paternal grandfather; Renal cancer in his father. There is no history of Stomach cancer, Esophageal cancer, or Pancreatic cancer.   Allergies Allergies  Allergen Reactions  . Nsaids Other (See Comments)    Bleeding GI Ulceration  . Ciprofloxacin Nausea And Vomiting     Home Medications  Prior to Admission medications   Medication Sig Start Date End Date Taking? Authorizing Provider  Cholecalciferol (VITAMIN D3) 20 MCG (800 UNIT) TABS Take 800 Units by mouth daily.    [provider]  cyanocobalamin (,VITAMIN B-12,) 1000 MCG/ML injection Inject 1,000 mcg into the muscle every 30 (thirty) days.  12/28/13   [provider]  DULoxetine (CYMBALTA) 30 MG capsule Take  30-60 mg by mouth See admin instructions. Take 60 mg in the morning and 30 mg in the evening    [provider]  gabapentin (NEURONTIN) 100 MG capsule Take 2 capsules (200 mg total) by mouth daily. Patient taking differently: Take 800 mg by mouth 3 (three) times daily.  01/09/20 03/03/20  Elodia Florence., MD  hydrOXYzine (ATARAX/VISTARIL) 10 MG tablet Take 1 tablet (10 mg total) by mouth 3 (three) times daily as needed for itching or anxiety. Patient taking differently: Take 10 mg by mouth 3 (three) times daily as needed for anxiety.  10/26/19   Cristal Ford, DO  Multiple Vitamin (MULTIVITAMIN WITH MINERALS) TABS tablet Take 1 tablet by mouth daily. 10/27/19   Cristal Ford, DO  oxyCODONE-acetaminophen (PERCOCET/ROXICET) 5-325 MG tablet Take by mouth every 4 (four) hours as needed for severe pain.    [provider]  pantoprazole (PROTONIX) 20 MG tablet Take 20 mg by mouth 2 (two) times daily.    [provider]  peg 3350 powder (MOVIPREP) 100 g SOLR Take 1 kit (200 g total) by mouth as directed. 03/03/20   Gatha Mayer, MD  prazosin (MINIPRESS) 1 MG capsule Take 1 mg by mouth at bedtime.    [provider]  sevelamer carbonate (RENVELA) 800 MG tablet Take 800 mg by mouth 3 (three) times daily with meals.    [provider]  sodium bicarbonate 650 MG tablet Take 650 mg by mouth 2 (two) times daily.    [provider]  tamsulosin (FLOMAX) 0.4 MG CAPS capsule Take 0.4 mg by mouth at bedtime.     [provider]     Critical care time: n/a

## 2020-04-02 NOTE — Progress Notes (Signed)
Nephrology Follow-Up Consult note   Assessment/Recommendations: Chase VIERNES Sr. is a/an 55 y.o. 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  AKI likely dehydration with obstruction contributing also with sepsis likely leading to ATN.  Severe electrolyte derangements with severe acidosis and likely uremia. Persistent but improving s/p HD -Plan for HD again tomorrow -Continue with IV hydration lactated Ringer's today -Appreciate IR help with temporary HD catheter and nephrostomy tubes -Unclear Crt baseline (anywhere from 2.5-5); consider TDC and fistula placement once patient's AMS improves and able to participate in converstations -Continue to monitor daily Cr, Dose meds for GFR -Monitor Daily I/Os, Daily weight  -Maintain MAP>65 for optimal renal perfusion.  -Avoid nephrotoxic medications including NSAIDs and Vanc/Zosyn combo  Bilateral hydronephrosis: History of such presenting with removal of his nephrostomy tubes and hydronephrosis.  IR consulted placed bilateral PCN, appreciate help.    Sepsis: Likely urosepsis.  Blood cultures no growth for 48 hours.    Urine cultures with multiple organisms suggestive recollection but likely unnecessary, this is likely multimicrobial given the urine cultures from each nephrostomy tube and urine grew the same thing.  Continue antibiotics per primary team  Severe metabolic acidosis: pH 4.401 now significantly improved with IV bicarbonate and dialysis.  Hypokalemia: Lactated Ringer's 100 cc/h with KCl 20 M EQ for 10 hours.  Anemia: Multifactorial with hemoglobin less than 7 today.  Transfusion per primary team.  Consider ESA  in the future  Hyperphosphatemia: Phosphorus checked and greater than 30.  Dialysis and PCN placement as above. Recheck today     Recommendations conveyed to primary service.    [facility] 04/02/2020 8:45 AM  ___________________________________________________________  CC: AKI on CKD  Interval History/Subjective: Patient underwent bilateral percutaneous nephrostomy tubes with 800 cc of urine over the past 24 hours.  Attempted dialysis last night with hypotension.  Worsening oxygen saturations related to poor upper airway clearance.  Was able to complete dialysis thereafter and has since been more alert.  Continues to be altered.  Unable to answer questions.   Medications:  Current Facility-Administered Medications  Medication Dose Route Frequency Provider Last Rate Last Admin  . acetaminophen (TYLENOL) tablet 650 mg  650 mg Oral Q6H PRN Opyd, Ilene Qua, MD       Or  . acetaminophen (TYLENOL) suppository 650 mg  650 mg Rectal Q6H PRN Opyd, Ilene Qua, MD   650 mg at 04/02/20 0103  . ceFEPIme (MAXIPIME) 1 g in sodium chloride 0.9 % 100 mL IVPB  1 g Intravenous Q24H Opyd, Ilene Qua, MD 200 mL/hr at 04/01/20 1658 1 g at 04/01/20 1658  . Chlorhexidine Gluconate Cloth 2 % PADS 6 each  6 each Topical Daily Charlynne Cousins, MD      . Chlorhexidine Gluconate Cloth 2 % PADS 6 each  6 each Topical Q0600 Reesa Chew, MD   6 each at 04/01/20 1036  . fentaNYL (SUBLIMAZE) injection 12.5-25 mcg  12.5-25 mcg Intravenous Q3H PRN Opyd, Ilene Qua, MD   12.5 mcg at 04/02/20 0342  . heparin injection 5,000 Units  5,000 Units Subcutaneous Q8H Opyd, Timothy S, MD      . heparin sodium (porcine) 1000 UNIT/ML injection           .  iohexol (OMNIPAQUE) 300 MG/ML solution 50 mL  50 mL Per Tube Once PRN Aletta Edouard, MD      . lactated ringers 1,000 mL with potassium chloride 20 mEq infusion   Intravenous Continuous Charlynne Cousins, MD      . ondansetron  [facility]) tablet 4 mg  4 mg Oral Q6H PRN Opyd, Ilene Qua, MD       Or  . ondansetron (ZOFRAN) injection 4 mg  4 mg Intravenous Q6H PRN Opyd, Ilene Qua, MD      . pantoprazole (PROTONIX) injection 40 mg  40 mg Intravenous Q24H Opyd, Ilene Qua, MD   40 mg at 04/02/20 0100  . sodium chloride flush (NS) 0.9 % injection 5 mL  5 mL Intracatheter Q8H Aletta Edouard, MD   5 mL at 04/01/20 1701   Facility-Administered Medications Ordered in Other Encounters  Medication Dose Route Frequency Provider Last Rate Last Admin  . bupivacaine liposome (EXPAREL) 1.3 % injection 266 mg  20 mL Infiltration Once Michael Boston, MD          Review of Systems: Unable to obtain due to AMS  Physical Exam: Vitals:   04/02/20 0540 04/02/20 0735  BP: 128/64   Pulse: 64   Resp: 18   Temp: 97.8 F (36.6 C) 99.2 F (37.3 C)  SpO2: 99%    No intake/output data recorded.  Intake/Output Summary (Last 24 hours) at 04/02/2020 0845 Last data filed at 04/02/2020 0600 Gross per 24 hour  Intake 2388.57 ml  Output 840 ml  Net 1548.57 ml   Constitutional: Chronically ill-appearing, yelling in bed ENMT: ears and nose without scars or lesions, dry mucous membranes CV: normal rate, no edema Respiratory: clear to auscultation, normal work of breathing Gastrointestinal: soft, non-tender, no palpable masses or hernias Skin: no visible lesions or rashes Psych: Altered, unable to answer questions, awake, agitated   Test Results I personally reviewed new and old clinical labs and radiology tests Lab Results  Component Value Date   NA 142 04/02/2020   K 3.0 (L) 04/02/2020   CL 102 04/02/2020   CO2 18 (L) 04/02/2020   BUN 59 (H) 04/02/2020   CREATININE 7.58 (H) 04/02/2020   CALCIUM 6.8 (L) 04/02/2020   ALBUMIN 3.4 (L) 03/31/2020   PHOS >30.0 (H) 04/01/2020

## 2020-04-03 DIAGNOSIS — Z515 Encounter for palliative care: Secondary | ICD-10-CM

## 2020-04-03 DIAGNOSIS — N179 Acute kidney failure, unspecified: Secondary | ICD-10-CM | POA: Diagnosis not present

## 2020-04-03 DIAGNOSIS — A419 Sepsis, unspecified organism: Secondary | ICD-10-CM | POA: Diagnosis not present

## 2020-04-03 DIAGNOSIS — C8114 Nodular sclerosis classical Hodgkin lymphoma, lymph nodes of axilla and upper limb: Secondary | ICD-10-CM | POA: Diagnosis not present

## 2020-04-03 DIAGNOSIS — Z7189 Other specified counseling: Secondary | ICD-10-CM

## 2020-04-03 DIAGNOSIS — N135 Crossing vessel and stricture of ureter without hydronephrosis: Secondary | ICD-10-CM | POA: Diagnosis not present

## 2020-04-03 DIAGNOSIS — R627 Adult failure to thrive: Secondary | ICD-10-CM | POA: Diagnosis not present

## 2020-04-03 LAB — BASIC METABOLIC PANEL
Anion gap: 22 — ABNORMAL HIGH (ref 5–15)
BUN: 69 mg/dL — ABNORMAL HIGH (ref 6–20)
CO2: 17 mmol/L — ABNORMAL LOW (ref 22–32)
Calcium: 7.3 mg/dL — ABNORMAL LOW (ref 8.9–10.3)
Chloride: 104 mmol/L (ref 98–111)
Creatinine, Ser: 8.39 mg/dL — ABNORMAL HIGH (ref 0.61–1.24)
GFR, Estimated: 7 mL/min — ABNORMAL LOW (ref >60)
Glucose, Bld: 86 mg/dL (ref 70–99)
Potassium: 3 mmol/L — ABNORMAL LOW (ref 3.5–5.1)
Sodium: 143 mmol/L (ref 135–145)

## 2020-04-03 LAB — CBC
HCT: 20.4 % — ABNORMAL LOW (ref 39.0–52.0)
Hemoglobin: 7.1 g/dL — ABNORMAL LOW (ref 13.0–17.0)
MCH: 29.8 pg (ref 26.0–34.0)
MCHC: 34.8 g/dL (ref 30.0–36.0)
MCV: 85.7 fL (ref 80.0–100.0)
Platelets: 180 10*3/uL (ref 150–400)
RBC: 2.38 MIL/uL — ABNORMAL LOW (ref 4.22–5.81)
RDW: 16.1 % — ABNORMAL HIGH (ref 11.5–15.5)
WBC: 12.5 10*3/uL — ABNORMAL HIGH (ref 4.0–10.5)
nRBC: 0 % (ref 0.0–0.2)

## 2020-04-03 LAB — HEPATITIS B SURFACE ANTIBODY, QUANTITATIVE: Hep B S AB Quant (Post): <3.1 m[IU]/mL — ABNORMAL LOW (ref >9.9)

## 2020-04-03 LAB — TSH: TSH: 1.382 u[IU]/mL (ref 0.350–4.500)

## 2020-04-03 LAB — GLUCOSE, CAPILLARY
Glucose-Capillary: 87 mg/dL (ref 70–99)
Glucose-Capillary: 112 mg/dL — ABNORMAL HIGH (ref 70–99)

## 2020-04-03 MED ORDER — SODIUM CHLORIDE 0.9 % IV SOLN
1.0000 g | INTRAVENOUS | Status: AC
  Administered 2020-04-03 – 2020-04-06 (×4): 1 g via INTRAVENOUS
  Filled 2020-04-03 (×4): qty 1

## 2020-04-03 MED ORDER — TAMSULOSIN HCL 0.4 MG PO CAPS
0.4000 mg | ORAL_CAPSULE | Freq: Every day | ORAL | Status: DC
  Administered 2020-04-03 – 2020-04-19 (×16): 0.4 mg via ORAL
  Filled 2020-04-03 (×16): qty 1

## 2020-04-03 MED ORDER — OXYCODONE-ACETAMINOPHEN 5-325 MG PO TABS
ORAL_TABLET | ORAL | Status: AC
  Administered 2020-04-03: 1 via ORAL
  Filled 2020-04-03: qty 1

## 2020-04-03 MED ORDER — SEVELAMER CARBONATE 800 MG PO TABS
800.0000 mg | ORAL_TABLET | Freq: Three times a day (TID) | ORAL | Status: DC
  Administered 2020-04-03 – 2020-04-07 (×7): 800 mg via ORAL
  Filled 2020-04-03 (×9): qty 1

## 2020-04-03 MED ORDER — DULOXETINE HCL 30 MG PO CPEP
30.0000 mg | ORAL_CAPSULE | Freq: Every day | ORAL | Status: DC
  Administered 2020-04-03 – 2020-05-02 (×29): 30 mg via ORAL
  Filled 2020-04-03 (×29): qty 1

## 2020-04-03 MED ORDER — DULOXETINE HCL 60 MG PO CPEP
60.0000 mg | ORAL_CAPSULE | Freq: Every day | ORAL | Status: DC
Start: 2020-04-03 — End: 2020-05-03
  Administered 2020-04-03 – 2020-05-03 (×30): 60 mg via ORAL
  Filled 2020-04-03 (×30): qty 1

## 2020-04-03 MED ORDER — DULOXETINE HCL 30 MG PO CPEP
30.0000 mg | ORAL_CAPSULE | ORAL | Status: DC
Start: 2020-04-03 — End: 2020-04-03

## 2020-04-03 MED ORDER — OXYCODONE-ACETAMINOPHEN 5-325 MG PO TABS
1.0000 | ORAL_TABLET | ORAL | Status: DC | PRN
  Administered 2020-04-04 – 2020-04-09 (×22): 1 via ORAL
  Filled 2020-04-03 (×23): qty 1

## 2020-04-03 MED ORDER — SODIUM BICARBONATE 650 MG PO TABS
650.0000 mg | ORAL_TABLET | Freq: Two times a day (BID) | ORAL | Status: DC
  Administered 2020-04-03 – 2020-04-10 (×13): 650 mg via ORAL
  Filled 2020-04-03 (×14): qty 1

## 2020-04-03 MED ORDER — HEPARIN SODIUM (PORCINE) 1000 UNIT/ML IJ SOLN
INTRAMUSCULAR | Status: AC
  Filled 2020-04-03: qty 3

## 2020-04-03 NOTE — Progress Notes (Signed)
[facility] PROGRESS NOTE    Progress Note  Gianlucas Evenson  HYQ:[id number] DOB: 1965-10-12 DOA: 03/31/2020 PCP: [name]     Brief Narrative:   Dymir Neeson. is an 55 y.o. 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  Creatinine of 16 (in October 5 0.7) with a leukocytosis CT scan showed bilateral hydronephrosis with a left transgluteal drainage without fluid collection and a large colonic stool burden  Assessment/Plan:   Acute on chronic kidney disease stage IV-V likely due to removal of his nephrostomy tube in the setting of obstructive uropathy: Nephrology was consulted recommended HD, dialysis catheter was placed on 04/01/2020 and HD was attempted which was not successful due to hypotension. Hypotension has resolved. IR also placed on 04/01/2020 his bilateral nephrostomy tubes. He is putting anywhere from 700 to 800 cc of urine daily. Nephrology recommended dialysis on 04/03/2020.   His baseline creatinine is around 3-5. Continue monitor I's and O's and daily weights.  Bilateral hydronephrosis: Tubes were removed accidentally, IR was reconsulted and tubes were replaced on 04/01/2020  Severe sepsis secondary to UTI: Encephalopathic and hypothermic on admission with a leukocytosis and blood pressure stable. Blood cultures of show no growth till date, they have shown multiple species. CT scan of the abdomen pelvis showed no abscesses. Multiple urine cultures were sent that showed multiple species, has been  leukocytosis is resolved blood pressure is improved. Continue IV cefepime for total of 7 days.  Normocytic anemia: He status post 2 units packed red blood cells his hemoglobin this morning is monitor intermittently. We will probably give another unit during dialysis will defer to renal.  Acute uremic encephalopathy: In the setting of infectious etiology, medication accumulation and rising BUN. He is significantly more awake this morning able to carry on a conversation he relates he is hungry. Now resolved.  History of pelvic abscess on 11/15/2019 he is status post ATG drain in place by IR /Colovesicular fistula: He has a transgluteal drain placed for his abscess with no significant fluid collection seen on CT scan. He is planned for colonoscopy and surgery in January 2022. Further management per surgery as an outpatient.  Chronic pain: Hold sedative.  History of CVA: Ischemic in July 2021 history of A. fib not a candidate for anticoagulation due to GI bleed and thrombocytopenia.  PTSD: Hold sedatives.  Normocytic anemia: With a hemoglobin of 8.3.  History of gastric bypass: Continue PPI.  History of Hodgkin's lymphoma: Noted.  Severe protein caloric malnutrition: Continue Ensure 3 times daily.   Sacral decubitus ulcer stage II present on admission RN Pressure Injury Documentation: Pressure Injury 11/08/19 Coccyx Upper Stage 2 -  Partial thickness loss of dermis presenting as a shallow open injury with a red, pink wound bed without slough. unblanchable with an open slit in the middle non draining  (Active)  11/08/19 0300  Location: Coccyx  Location Orientation: Upper  Staging: Stage 2 -  Partial thickness loss of dermis presenting as a shallow open injury with a red, pink wound bed without slough.  Wound Description (Comments): unblanchable with an open slit in the middle  non draining   Present on Admission: Yes   Goals of care/ethics: He has an extremely poor  prognosis, I have expressed this to the family Perative care has been consulted and working with family.  DVT prophylaxis: lovenox Family Communication: I have been unable to contact family. Status is: Inpatient  Remains inpatient appropriate because:Hemodynamically unstable   Dispo: The patient is from: SNF              Anticipated d/c is to: SNF              Anticipated d/c date is: > 3 days              Patient currently is not medically stable to d/c.        Code Status:     Code Status Orders  (From admission, onward)         Start     Ordered   03/31/20 2152  Full code  Continuous        03/31/20 2155        Code Status History    Date Active Date Inactive Code Status Order ID Comments User Context   12/24/2019 1537 01/08/2020 2106 Full Code [id number]  Jonnie Finner, DO ED   11/08/2019 0019 11/19/2019 2232 Full Code [id number]  Jacalyn Lefevre, MD ED   10/17/2019 1832 10/26/2019 1620 Full Code [id number]  Mendel Corning, MD Inpatient   Advance Care Planning Activity        IV Access:    Peripheral IV   Procedures and diagnostic studies:   IR Fluoro Guide CV Line Right  Result Date: 04/01/2020 CLINICAL DATA:  Renal failure and need for emergent hemodialysis. EXAM: NON-TUNNELED CENTRAL VENOUS HEMODIALYSIS CATHETER PLACEMENT WITH ULTRASOUND AND FLUOROSCOPIC GUIDANCE FLUOROSCOPY TIME:  30 seconds. PROCEDURE: The procedure, risks, benefits, and alternatives were explained to the patient's son. Questions regarding the procedure were encouraged and answered. The patient's son understands and consents to the procedure. A time-out was performed prior to initiating the procedure. Ultrasound was used to confirm patency of the right internal jugular vein. The right neck and chest were prepped with chlorhexidine in a sterile fashion, and a sterile drape was applied covering the operative field. Maximum barrier sterile technique with sterile gowns and gloves were used for the  procedure. Local anesthesia was provided with 1% lidocaine. After creating a small venotomy incision, a 21 gauge needle was advanced into the right internal jugular vein under direct, real-time ultrasound guidance. Ultrasound image documentation was performed. After securing guidewire access venous access was dilated over a guidewire. A 16 cm, 13 French triple-lumen Mahurkar non tunneled hemodialysis catheter was advanced over the wire. Final catheter positioning was confirmed and documented with a fluoroscopic spot image. The catheter was aspirated, flushed with saline, and injected with appropriate volume heparin dwells. The catheter exit site was secured with 0-Prolene retention sutures. COMPLICATIONS: None.  No pneumothorax. FINDINGS: After catheter placement, the tip lies at the cavoatrial junction. The catheter aspirates normally and is ready for immediate use. IMPRESSION: Placement of non-tunneled central venous hemodialysis catheter via the right internal jugular vein. The catheter tip lies at the cavoatrial junction. The catheter is ready for immediate use. Electronically Signed   By: Aletta Edouard M.D.   On: 04/01/2020 14:26   IR US Guide Vasc Access Right  Result Date: 04/01/2020 CLINICAL DATA:  Renal failure and need for emergent hemodialysis. EXAM: NON-TUNNELED CENTRAL VENOUS HEMODIALYSIS CATHETER PLACEMENT WITH ULTRASOUND AND FLUOROSCOPIC  GUIDANCE FLUOROSCOPY TIME:  30 seconds. PROCEDURE: The procedure, risks, benefits, and alternatives were explained to the patient's son. Questions regarding the procedure were encouraged and answered. The patient's son understands and consents to the procedure. A time-out was performed prior to initiating the procedure. Ultrasound was used to confirm patency of the right internal jugular vein. The right neck and chest were prepped with chlorhexidine in a sterile fashion, and a sterile drape was applied covering the operative field. Maximum barrier sterile  technique with sterile gowns and gloves were used for the procedure. Local anesthesia was provided with 1% lidocaine. After creating a small venotomy incision, a 21 gauge needle was advanced into the right internal jugular vein under direct, real-time ultrasound guidance. Ultrasound image documentation was performed. After securing guidewire access venous access was dilated over a guidewire. A 16 cm, 13 French triple-lumen Mahurkar non tunneled hemodialysis catheter was advanced over the wire. Final catheter positioning was confirmed and documented with a fluoroscopic spot image. The catheter was aspirated, flushed with saline, and injected with appropriate volume heparin dwells. The catheter exit site was secured with 0-Prolene retention sutures. COMPLICATIONS: None.  No pneumothorax. FINDINGS: After catheter placement, the tip lies at the cavoatrial junction. The catheter aspirates normally and is ready for immediate use. IMPRESSION: Placement of non-tunneled central venous hemodialysis catheter via the right internal jugular vein. The catheter tip lies at the cavoatrial junction. The catheter is ready for immediate use. Electronically Signed   By: Aletta Edouard M.D.   On: 04/01/2020 14:26   DG CHEST PORT 1 VIEW  Result Date: 04/02/2020 CLINICAL DATA:  Evaluate for pulmonary pathology. EXAM: PORTABLE CHEST 1 VIEW COMPARISON:  April 01, 2020 11:40 p.m. FINDINGS: The mediastinal contour and cardiac silhouette are stable. Right central venous line is identified with distal tip in the superior vena cava/right atrium. Consolidation of medial right lung base is identified. The left lung is clear. The bony structures are stable. IMPRESSION: Right lung base pneumonia. Electronically Signed   By: Abelardo Diesel M.D.   On: 04/02/2020 08:21   DG Chest Port 1 View  Result Date: 04/01/2020 CLINICAL DATA:  Acute respiratory distress EXAM: PORTABLE CHEST 1 VIEW COMPARISON:  03/31/2020 FINDINGS: Elevation of the right  hemidiaphragm again noted with associated right basilar atelectasis. Left lung clear. No pneumothorax or pleural effusion. Right internal jugular central venous catheter tip noted at the superior cavoatrial junction. Cardiac size within normal limits. Pulmonary vascularity is normal. No acute bone abnormality. IMPRESSION: No active disease. Electronically Signed   By: Fidela Salisbury MD   On: 04/01/2020 23:48   IR NEPHROSTOMY PLACEMENT LEFT  Result Date: 04/01/2020 CLINICAL DATA:  History of chronic pelvic abscess with colovesical fistula and bilateral ureteral obstruction requiring prior bilateral percutaneous nephrostomy tube placement. Status post prior left ureteral stent placement in September and right ureteral stent placement in October. Admitted with severe acute kidney injury superimposed on chronic kidney disease, lack of urine output and evidence of bilateral hydronephrosis by imaging. An indwelling right nephrostomy tube has been pulled out for unknown length of time. He now presents for new bilateral percutaneous nephrostomy tube placement for renal decompression to determine degree of recovery will renal function. EXAM: 1. LEFT PERCUTANEOUS NEPHROSTOMY TUBE PLACEMENT 2. RIGHT PERCUTANEOUS NEPHROSTOMY TUBE PLACEMENT COMPARISON:  CT of the abdomen and pelvis on 03/31/2020 ANESTHESIA/SEDATION: Moderate (conscious) sedation was employed during this procedure. A total of Versed 1.0 mg and Fentanyl 50 mcg was administered intravenously. Moderate Sedation Time: 27 minutes. The  patient's level of consciousness and vital signs were monitored continuously by radiology nursing throughout the procedure under my direct supervision. CONTRAST:  15 mL Omnipaque 300 MEDICATIONS: 1 g IV Rocephin. Antibiotic was administered in an appropriate time frame prior to skin puncture. FLUOROSCOPY TIME:  1 minute and 54 seconds.  8.0 mGy. PROCEDURE: The procedure, risks, benefits, and alternatives were explained to the  patient's son by telephone. Questions regarding the procedure were encouraged and answered. The patient's son understands and consents to the procedure. A time-out was performed prior to initiating the procedure. Bilateral flank regions were prepped with chlorhexidine in a sterile fashion, and a sterile drape was applied covering the operative field. A sterile gown and sterile gloves were used for the procedure. Local anesthesia was provided with 1% Lidocaine. Ultrasound was used to localize the left kidney. Under direct ultrasound guidance, a 21 gauge needle was advanced into the renal collecting system. Ultrasound image documentation was performed. Aspiration of urine sample was performed followed by contrast injection under fluoroscopy. A transitional dilator was advanced over a guidewire. Percutaneous tract dilatation was then performed over the guidewire. A 10-French percutaneous nephrostomy tube was then advanced and formed in the left renal collecting system. Additional urine sample was aspirated and sent for culture analysis. Catheter position was confirmed by fluoroscopy after contrast injection. Ultrasound was then used to localize the right kidney. A 21 gauge needle was advanced under ultrasound guidance into the right renal collecting system and ultrasound image documentation performed. After aspiration of urine, contrast was injected under fluoroscopy. A transitional dilator was advanced over a guidewire under fluoroscopy. The tract was dilated and a 10 French percutaneous nephrostomy tube advanced and formed in the right renal collecting system. Catheter position was confirmed by fluoroscopy after contrast injection. Both nephrostomy tubes were secured at the skin with Prolene retention sutures and Stat-Lock devices. Both were connected to gravity drainage bags. COMPLICATIONS: None. FINDINGS: Ultrasound confirms the presence of significant hydronephrosis bilaterally. After access of the left renal  collecting system and placement of a nephrostomy tube formed in the renal pelvis, there is return of grossly purulent appearing urine. A sample of purulent fluid was sent for culture analysis. After placement of the right nephrostomy tube which was also formed in the renal pelvis, there is return of some blood tinged urine. IMPRESSION: Placement of bilateral 10 French percutaneous nephrostomy tubes formed at the level of the renal pelvis. Urine from the left kidney was grossly purulent and a sample sent for culture analysis. Both nephrostomy tubes were connected to gravity drainage bags. Electronically Signed   By: Aletta Edouard M.D.   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 Status post prior left ureteral stent placement in September and right ureteral stent placement in October. Admitted with severe acute kidney injury superimposed on chronic kidney disease, lack of urine output and evidence of bilateral hydronephrosis by imaging. An indwelling right nephrostomy tube has been pulled out for unknown length of time. He now presents for new bilateral percutaneous nephrostomy tube placement for renal decompression to determine degree of recovery will renal function. EXAM: 1. LEFT PERCUTANEOUS NEPHROSTOMY TUBE PLACEMENT 2. RIGHT PERCUTANEOUS NEPHROSTOMY TUBE PLACEMENT COMPARISON:  CT of the abdomen and pelvis on 03/31/2020 ANESTHESIA/SEDATION: Moderate (conscious) sedation was employed during this procedure. A total of Versed 1.0 mg and Fentanyl 50 mcg  was administered intravenously. Moderate Sedation Time: 27 minutes. The patient's level of consciousness and vital signs were monitored continuously by radiology nursing throughout the procedure under my direct supervision. CONTRAST:  15 mL Omnipaque 300  MEDICATIONS: 1 g IV Rocephin. Antibiotic was administered in an appropriate time frame prior to skin puncture. FLUOROSCOPY TIME:  1 minute and 54 seconds.  8.0 mGy. PROCEDURE: The procedure, risks, benefits, and alternatives were explained to the patient's son by telephone. Questions regarding the procedure were encouraged and answered. The patient's son understands and consents to the procedure. A time-out was performed prior to initiating the procedure. Bilateral flank regions were prepped with chlorhexidine in a sterile fashion, and a sterile drape was applied covering the operative field. A sterile gown and sterile gloves were used for the procedure. Local anesthesia was provided with 1% Lidocaine. Ultrasound was used to localize the left kidney. Under direct ultrasound guidance, a 21 gauge needle was advanced into the renal collecting system. Ultrasound image documentation was performed. Aspiration of urine sample was performed followed by contrast injection under fluoroscopy. A transitional dilator was advanced over a guidewire. Percutaneous tract dilatation was then performed over the guidewire. A 10-French percutaneous nephrostomy tube was then advanced and formed in the left renal collecting system. Additional urine sample was aspirated and sent for culture analysis. Catheter position was confirmed by fluoroscopy after contrast injection. Ultrasound was then used to localize the right kidney. A 21 gauge needle was advanced under ultrasound guidance into the right renal collecting system and ultrasound image documentation performed. After aspiration of urine, contrast was injected under fluoroscopy. A transitional dilator was advanced over a guidewire under fluoroscopy. The tract was dilated and a 10 French percutaneous nephrostomy tube advanced and formed in the right renal collecting system. Catheter position was confirmed by fluoroscopy after contrast injection. Both nephrostomy tubes were secured at the  skin with Prolene retention sutures and Stat-Lock devices. Both were connected to gravity drainage bags. COMPLICATIONS: None. FINDINGS: Ultrasound confirms the presence of significant hydronephrosis bilaterally. After access of the left renal collecting system and placement of a nephrostomy tube formed in the renal pelvis, there is return of grossly purulent appearing urine. A sample of purulent fluid was sent for culture analysis. After placement of the right nephrostomy tube which was also formed in the renal pelvis, there is return of some blood tinged urine. IMPRESSION: Placement of bilateral 10 French percutaneous nephrostomy tubes formed at the level of the renal pelvis. Urine from the left kidney was grossly purulent and a sample sent for culture analysis. Both nephrostomy tubes were connected to gravity drainage bags. Electronically Signed   By: Aletta Edouard M.D.   On: 04/01/2020 14:20     Medical Consultants:    None.  Anti-Infectives:   Cefepime  Subjective:    [name] has no new complaints this morning he relates he is hungry.  Objective:    Vitals:   04/03/20 0250 04/03/20 0344 04/03/20 0510 04/03/20 0700  BP: (!) 119/52  112/62   Pulse: (!) 55  (!) 48 64  Resp: 14  12 17   Temp: 97.8 F (36.6 C)  98 F (36.7 C)   TempSrc: Oral  Oral   SpO2: 98%  100% 100%  Weight:  56.1 kg    Height:       SpO2: 100 % O2 Flow Rate (L/min): 10 L/min   Intake/Output Summary (Last 24 hours) at 04/03/2020 0915 Last data filed at 04/03/2020 0325 Gross per  24 hour  Intake 1105.44 ml  Output 710 ml  Net 395.44 ml   Filed Weights   04/01/20 0155 04/02/20 0540 04/03/20 0344  Weight: 53.4 kg 54 kg 56.1 kg    Exam: General exam: In no acute distress, cachectic Respiratory system: Good air movement and clear to auscultation. Cardiovascular system: S1 & S2 heard, RRR. No JVD. Gastrointestinal system: Abdomen is nondistended, soft and nontender.  Extremities: No pedal  edema. Skin: No rashes, lesions or ulcers Psychiatry: Judgement and insight appear normal. Mood & affect appropriate. Data Reviewed:    Labs: Basic Metabolic Panel: Recent Labs  Lab 03/31/20 1420 04/01/20 0213 04/01/20 0841 04/02/20 0312 04/03/20 0401  NA 133* 141 145 142 143  K 5.1 4.2 3.0* 3.0* 3.0*  CL 108 108 106 102 104  CO2 <7* <7* 9* 18* 17*  GLUCOSE 126* 88 76 75 86  BUN 136* 134* 133* 59* 69*  CREATININE 16.69* 15.49* 15.34* 7.58* 8.39*  CALCIUM 7.9* 7.3* 6.6* 6.8* 7.3*  MG  --  2.2  --   --   --   PHOS  --  >30.0*  --  4.2  --    GFR Estimated Creatinine Clearance: 7.8 mL/min (A) (by C-G formula based on SCr of 8.39 mg/dL (H)). Liver Function Tests: Recent Labs  Lab 03/31/20 1420  AST 38  ALT 25  ALKPHOS 146*  BILITOT 0.9  PROT 8.0  ALBUMIN 3.4*   No results for input(s): LIPASE, AMYLASE in the last 168 hours. No results for input(s): AMMONIA in the last 168 hours. Coagulation profile Recent Labs  Lab 03/31/20 1420  INR 1.5*   COVID-19 Labs  No results for input(s): DDIMER, FERRITIN, LDH, CRP in the last 72 hours.  Lab Results  Component Value Date   SARSCOV2NAA NEGATIVE 03/31/2020   [place] NEGATIVE 01/07/2020   [place] NEGATIVE 12/24/2019   [place] NEGATIVE 11/19/2019    CBC: Recent Labs  Lab 03/31/20 1420 04/01/20 0213 04/02/20 0312 04/02/20 1533 04/03/20 0401  WBC 16.6* 13.4* 12.5*  --  12.5*  NEUTROABS 15.1*  --   --   --   --   HGB 8.3* 7.0* 6.3* 7.9* 7.1*  HCT 24.7* 19.8* 18.1* 22.2* 20.4*  MCV 92.2 88.8 85.4  --  85.7  PLT 262 215 220  --  180   Cardiac Enzymes: No results for input(s): CKTOTAL, CKMB, CKMBINDEX, TROPONINI in the last 168 hours. BNP (last 3 results) No results for input(s): PROBNP in the last 8760 hours. CBG: Recent Labs  Lab 04/02/20 1101 04/02/20 1607 04/02/20 2153 04/02/20 2333 04/03/20 0343  GLUCAP 73 74 85 88 87   D-Dimer: No results for input(s): DDIMER in the last 72  hours. Hgb A1c: No results for input(s): HGBA1C in the last 72 hours. Lipid Profile: No results for input(s): CHOL, HDL, LDLCALC, TRIG, CHOLHDL, LDLDIRECT in the last 72 hours. Thyroid function studies: No results for input(s): TSH, T4TOTAL, T3FREE, THYROIDAB in the last 72 hours.  Invalid input(s): FREET3 Anemia work up: No results for input(s): VITAMINB12, FOLATE, FERRITIN, TIBC, IRON, RETICCTPCT in the last 72 hours. Sepsis Labs: Recent Labs  Lab 03/31/20 1420 03/31/20 1641 04/01/20 0213 04/02/20 0312 04/03/20 0401  WBC 16.6*  --  13.4* 12.5* 12.5*  LATICACIDVEN 0.9 0.6  --   --   --    Microbiology Recent Results (from the past 240 hour(s))  Urine culture     Status: Abnormal   Collection Time: 03/31/20  2:07 PM  Specimen: In/Out Cath Urine  Result Value Ref Range Status   Specimen Description   Final    IN/OUT CATH URINE Performed at [facility], [address], [place], [place] [address]    Special Requests   Final    NONE Performed at [facility], [address] [place], [place] [address]    Culture MULTIPLE SPECIES PRESENT, SUGGEST RECOLLECTION (A)  Final   Report Status 04/02/2020 FINAL  Final  Blood Culture (routine x 2)     Status: None (Preliminary result)   Collection Time: 03/31/20  2:20 PM   Specimen: BLOOD  Result Value Ref Range Status   Specimen Description BLOOD BLOOD LEFT FOREARM  Final   Special Requests   Final    BOTTLES DRAWN AEROBIC AND ANAEROBIC Blood Culture adequate volume   Culture   Final    NO GROWTH 3 DAYS Performed at [facility], [address], [place], [place] [address]    Report Status PENDING  Incomplete  Blood Culture (routine x 2)     Status: None (Preliminary result)   Collection Time: 03/31/20  2:20 PM   Specimen: BLOOD  Result Value Ref Range Status   Specimen Description BLOOD LEFT ANTECUBITAL  Final   Special Requests   Final    BOTTLES DRAWN AEROBIC AND ANAEROBIC Blood Culture adequate volume   Culture   Final     NO GROWTH 3 DAYS Performed at [facility], [address], [place], [place] [address]    Report Status PENDING  Incomplete  Resp Panel by RT-PCR (Flu A&B, Covid) Nasopharyngeal Swab     Status: None   Collection Time: 03/31/20  2:48 PM   Specimen: Nasopharyngeal Swab; Nasopharyngeal(NP) swabs in vial transport medium  Result Value Ref Range Status   SARS Coronavirus 2 by RT PCR NEGATIVE NEGATIVE Final    Comment: (NOTE) SARS-CoV-2 target nucleic acids are NOT DETECTED.  The SARS-CoV-2 RNA is generally detectable in upper respiratory specimens during the acute phase of infection. The lowest concentration of SARS-CoV-2 viral copies this assay can detect is 138 copies/mL. A negative result does not preclude SARS-Cov-2 infection and should not be used as the sole basis for treatment or other patient management decisions. A negative result may occur with  improper specimen collection/handling, submission of specimen other than nasopharyngeal swab, presence of viral mutation(s) within the areas targeted by this assay, and inadequate number of viral copies(<138 copies/mL). A negative result must be combined with clinical observations, patient history, and epidemiological information. The expected result is Negative.  Fact Sheet for Patients:  EntrepreneurPulse.com.au  Fact Sheet for Healthcare Providers:  IncredibleEmployment.be  This test is no t yet approved or cleared by the [place] FDA and  has been authorized for detection and/or diagnosis of SARS-CoV-2 by FDA under an Emergency Use Authorization (EUA). This EUA will remain  in effect (meaning this test can be used) for the duration of the COVID-19 declaration under Section 564(b)(1) of the Act, 21 U.S.C.section 360bbb-3(b)(1), unless the authorization is terminated  or revoked sooner.       Influenza A by PCR NEGATIVE NEGATIVE Final   Influenza B by PCR NEGATIVE NEGATIVE Final     Comment: (NOTE) The Xpert Xpress SARS-CoV-2/FLU/RSV plus assay is intended as an aid in the diagnosis of influenza from Nasopharyngeal swab specimens and should not be used as a sole basis for treatment. Nasal washings and aspirates are unacceptable for Xpert Xpress SARS-CoV-2/FLU/RSV testing.  Fact Sheet for Patients: EntrepreneurPulse.com.au  Fact Sheet for Healthcare Providers: IncredibleEmployment.be  This test is not yet approved or cleared by the [place] FDA and has been authorized for detection and/or diagnosis of SARS-CoV-2 by FDA under an Emergency Use Authorization (EUA). This EUA will remain in effect (meaning this test can be used) for the duration of the COVID-19 declaration under Section 564(b)(1) of the Act, 21 U.S.C. section 360bbb-3(b)(1), unless the authorization is terminated or revoked.  Performed at [facility], [address] [place], [place] [address]   Urine Culture     Status: Abnormal   Collection Time: 03/31/20 10:27 PM   Specimen: In/Out Cath Urine  Result Value Ref Range Status   Specimen Description IN/OUT CATH URINE  Final   Special Requests   Final    NONE Performed at [facility], [place] [address], [place], [place] [address]    Culture MULTIPLE SPECIES PRESENT, SUGGEST RECOLLECTION (A)  Final   Report Status 04/02/2020 FINAL  Final  Urine culture     Status: Abnormal   Collection Time: 04/01/20  1:57 PM   Specimen: Urine, Random  Result Value Ref Range Status   Specimen Description URINE, RANDOM  Final   Special Requests   Final    LEFT KIDNEY Performed at [facility], [place] [address], [place], [place] [address]    Culture MULTIPLE SPECIES PRESENT, SUGGEST RECOLLECTION (A)  Final   Report Status 04/02/2020 FINAL  Final     Medications:   . Chlorhexidine Gluconate Cloth  6 each Topical Daily  . Chlorhexidine Gluconate Cloth  6 each Topical Q0600  . heparin  5,000 Units Subcutaneous  Q8H  . pantoprazole (PROTONIX) IV  40 mg Intravenous Q24H  . sodium chloride flush  5 mL Intracatheter Q8H   Continuous Infusions: . ceFEPime (MAXIPIME) IV 1 g (04/02/20 1600)  . lactated ringers with kcl 100 mL/hr at 04/03/20 0431      LOS: 3 days   Charlynne Cousins  [facility]  04/03/2020, 9:15 AM

## 2020-04-03 NOTE — Progress Notes (Signed)
Patient ID: Chase [name]., male   DOB: 22-Sep-1965, 55 y.o.   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  Found to have creatinine of 16. Also with severe urosepsis.  CT scan showed bilateral hydronephrosis with a left transgluteal drainage without fluid collection and a large colonic stool burden. Bilateral percutaneous nephrostomy tubes placed by IR 1/1. Plans for short term HD vs CRRT. Poor candidate for outpatient dialysis. PMT consulted to discuss [place].   Patient is lethargic and non verbal.  Unable to follow commands.  Does not have medical decisional capacity at this time  Today's Discussion :  Education regarding Current medical situation   Spoke to son/[name] by telephone.  Created space and opportunity for exploration of thoughts and feelings regarding current medical situation.  Vebalizes an understanding of the seriousness of his father's medical situation.  Reports continued physical, functional and emotional decline over the past several years since the death of his wife 07-Jul-2014.  Raised awareness to importance of ACP decisions and next steps in treatment plan.  Discussed the importance of continued conversation with family and the medical providers regarding overall plan of care and treatment options, ensuring decisions are within the context of the patients values and GOCs.  Questions and concerns addressed   Family in agreement for family meeting tomorrow   Vital  Signs Vitals:   04/03/20 0700 04/03/20 1116  BP:  127/82  Pulse: 64 (!) 55  Resp: 17 19  Temp:  97.7 F (36.5 C)  SpO2: 100% 98%    Intake/Output Summary (Last 24 hours) at 04/03/2020 1215/07/07 Last data filed at 04/03/2020 1287 Gross per 24 hour  Intake 1105.44 ml  Output 710 ml  Net 395.44 ml   Last Weight  Most recent update: 04/03/2020  3:44 AM   Weight  56.1 kg (123 lb 10.9 oz)            Recommendations and Plan:  Family meeting scheduled for tomorrow with two sons by conference call at 11:00 am  Discussed with Dr. Venetia Constable via secure chat  Time In: 1000 Time Out: 1045 Time Spent: 45 minutes Greater than 50% of the time was spent in counseling and coordination of care ______________________________________________________________________________________ Wadie Lessen NP  [facility] Team Team Cell Phone: [phone] Please utilize secure chat with additional questions, if there is no response within 30 minutes please call the above phone number  Palliative Medicine Team providers are available by phone from 7am to 7pm daily and can be reached through the team cell phone.  Should this patient require assistance outside of these hours, please call the patient's attending physician.

## 2020-04-03 NOTE — Progress Notes (Signed)
Referring Physician(s): Dr. Mitzi Hansen  Supervising Physician: Ruthann Cancer  Patient Status:  [facility] - In-pt  Chief Complaint: Bilateral hydronephrosis AKI s/p temp cath placement Bilateral percutaneous nephrostomy tube placement  Subjective: Uncooperative with exam.  When attempting to awaken, he shakes his head 'No.' Both PCN gravity bags are visible, however difficult site assessment due to positioning.   Allergies: Nsaids and Ciprofloxacin  Medications: Prior to Admission medications   Medication Sig Start Date End Date Taking? Authorizing Provider  docusate sodium (COLACE) 100 MG capsule Take 100 mg by mouth 2 (two) times daily as needed (constipation).   Yes [provider]  Cholecalciferol (VITAMIN D3) 20 MCG (800 UNIT) TABS Take 800 Units by mouth daily.    [provider]  cyanocobalamin (,VITAMIN B-12,) 1000 MCG/ML injection Inject 1,000 mcg into the muscle every 30 (thirty) days.  12/28/13   [provider]  DULoxetine (CYMBALTA) 30 MG capsule Take 30-60 mg by mouth See admin instructions. Take 60 mg in the morning and 30 mg in the evening    [provider]  gabapentin (NEURONTIN) 100 MG capsule Take 2 capsules (200 mg total) by mouth daily. Patient taking differently: Take 800 mg by mouth 3 (three) times daily.  01/09/20 03/03/20  Elodia Florence., MD  hydrOXYzine (ATARAX/VISTARIL) 10 MG tablet Take 1 tablet (10 mg total) by mouth 3 (three) times daily as needed for itching or anxiety. Patient taking differently: Take 10 mg by mouth 3 (three) times daily as needed for anxiety. 10/26/19   Cristal Ford, DO  Multiple Vitamin (MULTIVITAMIN WITH MINERALS) TABS tablet Take 1 tablet by mouth daily. 10/27/19   Cristal Ford, DO  oxyCODONE-acetaminophen (PERCOCET/ROXICET) 5-325 MG tablet Take by mouth every 4 (four) hours as needed for severe pain.    [provider]  pantoprazole (PROTONIX) 20 MG tablet Take 20 mg by  mouth 2 (two) times daily.    [provider]  peg 3350 powder (MOVIPREP) 100 g SOLR Take 1 kit (200 g total) by mouth as directed. 03/03/20   Gatha Mayer, MD  prazosin (MINIPRESS) 1 MG capsule Take 1 mg by mouth at bedtime.    [provider]  sevelamer carbonate (RENVELA) 800 MG tablet Take 800 mg by mouth 3 (three) times daily with meals.    [provider]  sodium bicarbonate 650 MG tablet Take 650 mg by mouth 2 (two) times daily.    [provider]  sodium chloride flush 0.9 % SOLN injection Inject 3 mLs into the vein daily.    [provider]  tamsulosin (FLOMAX) 0.4 MG CAPS capsule Take 0.4 mg by mouth at bedtime.     [provider]     Vital Signs: BP 127/82 (BP Location: Left Arm)   Pulse (!) 55   Temp 97.7 F (36.5 C) (Oral)   Resp 19   Ht 5' 6"  (1.676 m)   Wt 123 lb 10.9 oz (56.1 kg)   SpO2 98%   BMI 19.96 kg/m   Physical Exam  NAD, uncooperative Abdomen/back: Unable to assess insertion sites for bilateral PCNs or abscess drain, however gravity bags with adequate dark yellow urine. Also has foley in place with hazy dark yellow urine.   No output documented for TG drain.   Imaging: IR Fluoro Guide CV Line Right  Result Date: 04/01/2020 CLINICAL DATA:  Renal failure and need for emergent hemodialysis. EXAM: NON-TUNNELED CENTRAL VENOUS HEMODIALYSIS CATHETER PLACEMENT WITH ULTRASOUND AND FLUOROSCOPIC GUIDANCE  FLUOROSCOPY TIME:  30 seconds. PROCEDURE: The procedure, risks, benefits, and alternatives were explained to the patient's son. Questions regarding the procedure were encouraged and answered. The patient's son understands and consents to the procedure. A time-out was performed prior to initiating the procedure. Ultrasound was used to confirm patency of the right internal jugular vein. The right neck and chest were prepped with chlorhexidine in a sterile fashion, and a sterile drape was applied covering the operative  field. Maximum barrier sterile technique with sterile gowns and gloves were used for the procedure. Local anesthesia was provided with 1% lidocaine. After creating a small venotomy incision, a 21 gauge needle was advanced into the right internal jugular vein under direct, real-time ultrasound guidance. Ultrasound image documentation was performed. After securing guidewire access venous access was dilated over a guidewire. A 16 cm, 13 French triple-lumen Mahurkar non tunneled hemodialysis catheter was advanced over the wire. Final catheter positioning was confirmed and documented with a fluoroscopic spot image. The catheter was aspirated, flushed with saline, and injected with appropriate volume heparin dwells. The catheter exit site was secured with 0-Prolene retention sutures. COMPLICATIONS: None.  No pneumothorax. FINDINGS: After catheter placement, the tip lies at the cavoatrial junction. The catheter aspirates normally and is ready for immediate use. IMPRESSION: Placement of non-tunneled central venous hemodialysis catheter via the right internal jugular vein. The catheter tip lies at the cavoatrial junction. The catheter is ready for immediate use. Electronically Signed   By: Aletta Edouard M.D.   On: 04/01/2020 14:26   IR US Guide Vasc Access Right  Result Date: 04/01/2020 CLINICAL DATA:  Renal failure and need for emergent hemodialysis. EXAM: NON-TUNNELED CENTRAL VENOUS HEMODIALYSIS CATHETER PLACEMENT WITH ULTRASOUND AND FLUOROSCOPIC GUIDANCE FLUOROSCOPY TIME:  30 seconds. PROCEDURE: The procedure, risks, benefits, and alternatives were explained to the patient's son. Questions regarding the procedure were encouraged and answered. The patient's son understands and consents to the procedure. A time-out was performed prior to initiating the procedure. Ultrasound was used to confirm patency of the right internal jugular vein. The right neck and chest were prepped with chlorhexidine in a sterile fashion, and  a sterile drape was applied covering the operative field. Maximum barrier sterile technique with sterile gowns and gloves were used for the procedure. Local anesthesia was provided with 1% lidocaine. After creating a small venotomy incision, a 21 gauge needle was advanced into the right internal jugular vein under direct, real-time ultrasound guidance. Ultrasound image documentation was performed. After securing guidewire access venous access was dilated over a guidewire. A 16 cm, 13 French triple-lumen Mahurkar non tunneled hemodialysis catheter was advanced over the wire. Final catheter positioning was confirmed and documented with a fluoroscopic spot image. The catheter was aspirated, flushed with saline, and injected with appropriate volume heparin dwells. The catheter exit site was secured with 0-Prolene retention sutures. COMPLICATIONS: None.  No pneumothorax. FINDINGS: After catheter placement, the tip lies at the cavoatrial junction. The catheter aspirates normally and is ready for immediate use. IMPRESSION: Placement of non-tunneled central venous hemodialysis catheter via the right internal jugular vein. The catheter tip lies at the cavoatrial junction. The catheter is ready for immediate use. Electronically Signed   By: Aletta Edouard M.D.   On: 04/01/2020 14:26   DG CHEST PORT 1 VIEW  Result Date: 04/02/2020 CLINICAL DATA:  Evaluate for pulmonary pathology. EXAM: PORTABLE CHEST 1 VIEW COMPARISON:  April 01, 2020 11:40 p.m. FINDINGS: The mediastinal contour and cardiac silhouette are stable. Right central venous line is  identified with distal tip in the superior vena cava/right atrium. Consolidation of medial right lung base is identified. The left lung is clear. The bony structures are stable. IMPRESSION: Right lung base pneumonia. Electronically Signed   By: Abelardo Diesel M.D.   On: 04/02/2020 08:21   DG Chest Port 1 View  Result Date: 04/01/2020 CLINICAL DATA:  Acute respiratory distress EXAM:  PORTABLE CHEST 1 VIEW COMPARISON:  03/31/2020 FINDINGS: Elevation of the right hemidiaphragm again noted with associated right basilar atelectasis. Left lung clear. No pneumothorax or pleural effusion. Right internal jugular central venous catheter tip noted at the superior cavoatrial junction. Cardiac size within normal limits. Pulmonary vascularity is normal. No acute bone abnormality. IMPRESSION: No active disease. Electronically Signed   By: Fidela Salisbury MD   On: 04/01/2020 23:48   DG Chest Port 1 View  Result Date: 03/31/2020 CLINICAL DATA:  Altered mental status, questionable sepsis, pending COVID test. EXAM: PORTABLE CHEST 1 VIEW COMPARISON:  Chest x-ray dated 11/08/2019. FINDINGS: Heart size and mediastinal contours are within normal limits. Lungs appear clear. No pleural effusion or pneumothorax is seen. Osseous structures about the chest are unremarkable. IMPRESSION: No active disease. No evidence of pneumonia. Electronically Signed   By: Franki Cabot M.D.   On: 03/31/2020 14:36   CT Renal Stone Study  Result Date: 03/31/2020 CLINICAL DATA:  Flank pain EXAM: CT ABDOMEN AND PELVIS WITHOUT CONTRAST TECHNIQUE: Multidetector CT imaging of the abdomen and pelvis was performed following the standard protocol without IV contrast. COMPARISON:  12/30/2019, 01/26/2020, 11/14/2019, 12/24/2019 FINDINGS: Lower chest: Lung bases demonstrate no acute consolidation or pleural effusion. Normal cardiac size. Hepatobiliary: Status post cholecystectomy. No focal hepatic abnormality. No biliary dilatation. Pancreas: Unremarkable. No pancreatic ductal dilatation or surrounding inflammatory changes. Spleen: Normal in size without focal abnormality. Adrenals/Urinary Tract: Adrenal glands within normal limits. Bilateral percutaneous nephrostomy catheters has been removed. Linear soft tissue tract is visible posteriorly. Bilateral ureteral stents are visualized, proximal pigtail on the right is within extrarenal  pelvis, distal pigtail within the posterior bladder. Proximal pigtail of left ureteral stent is visible in the proximal left ureter, the distal portion of the stent is in the urinary bladder. Moderate bilateral hydronephrosis which is increased compared to the previous CT. Multiple small foci of gas within the bilateral renal collecting systems which may be due to refluxed air from the bladder versus recent removal of percutaneous nephrostomy tubes. Moderate perinephric fat stranding. Foley catheter within the bladder. Small air within the urinary bladder which appears slightly thick-walled. Stomach/Bowel: The stomach is nonenlarged. Evidence of prior gastric bypass. No dilated small bowel. Large amount of stool in the colon. Negative appendix. No acute bowel wall thickening. Vascular/Lymphatic: Mild aortic atherosclerosis without aneurysm. No suspicious nodes. Reproductive: Slightly enlarged prostate Other: No free air or free fluid. Left trans gluteal percutaneous drainage catheter with pigtail visible in the left posterior pelvis. No significant fluid collections visible within the pelvis at this time. Musculoskeletal: No acute or suspicious osseous abnormality. IMPRESSION: 1. Bilateral percutaneous nephrostomy catheters have been removed. Interim finding of moderate bilateral hydronephrosis of extrarenal pelvises, increased compared to prior CT. Right ureteral stent in expected position. Proximal pigtail of the left ureteral stent is visible within the proximal left ureter. Foley catheter present within decompressed urinary bladder; bladder wall appears slightly thickened. 2. Left trans gluteal percutaneous drainage catheter with pigtail visible in the left posterior pelvis. No significant fluid collections visible within the pelvis at this time. 3. Large amount of stool in  the colon, suggesting constipation. Aortic Atherosclerosis (ICD10-I70.0). Electronically Signed   By: Donavan Foil M.D.   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 Status post prior left ureteral stent placement in September and right ureteral stent placement in October. Admitted with severe acute kidney injury superimposed on chronic kidney disease, lack of urine output and evidence of bilateral hydronephrosis by imaging. An indwelling right nephrostomy tube has been pulled out for unknown length of time. He now presents for new bilateral percutaneous nephrostomy tube placement for renal decompression to determine degree of recovery will renal function. EXAM: 1. LEFT PERCUTANEOUS NEPHROSTOMY TUBE PLACEMENT 2. RIGHT PERCUTANEOUS NEPHROSTOMY TUBE PLACEMENT COMPARISON:  CT of the abdomen and pelvis on 03/31/2020 ANESTHESIA/SEDATION: Moderate (conscious) sedation was employed during this procedure. A total of Versed 1.0 mg and Fentanyl 50 mcg was administered intravenously. Moderate Sedation Time: 27 minutes. The patient's level of consciousness and vital signs were monitored continuously by radiology nursing throughout the procedure under my direct supervision. CONTRAST:  15 mL Omnipaque 300 MEDICATIONS: 1 g IV Rocephin. Antibiotic was administered in an appropriate time frame prior to skin puncture. FLUOROSCOPY TIME:  1 minute and 54 seconds.  8.0 mGy. PROCEDURE: The procedure, risks, benefits, and alternatives were explained to the patient's son by telephone. Questions regarding the procedure were encouraged and answered. The patient's son understands and consents to the procedure. A time-out was performed prior to initiating the procedure. Bilateral flank regions were prepped with chlorhexidine in a sterile fashion, and a sterile drape was applied covering the operative field. A sterile gown and sterile gloves were used for the  procedure. Local anesthesia was provided with 1% Lidocaine. Ultrasound was used to localize the left kidney. Under direct ultrasound guidance, a 21 gauge needle was advanced into the renal collecting system. Ultrasound image documentation was performed. Aspiration of urine sample was performed followed by contrast injection under fluoroscopy. A transitional dilator was advanced over a guidewire. Percutaneous tract dilatation was then performed over the guidewire. A 10-French percutaneous nephrostomy tube was then advanced and formed in the left renal collecting system. Additional urine sample was aspirated and sent for culture analysis. Catheter position was confirmed by fluoroscopy after contrast injection. Ultrasound was then used to localize the right kidney. A 21 gauge needle was advanced under ultrasound guidance into the right renal collecting system and ultrasound image documentation performed. After aspiration of urine, contrast was injected under fluoroscopy. A transitional dilator was advanced over a guidewire under fluoroscopy. The tract was dilated and a 10 French percutaneous nephrostomy tube advanced and formed in the right renal collecting system. Catheter position was confirmed by fluoroscopy after contrast injection. Both nephrostomy tubes were secured at the skin with Prolene retention sutures and Stat-Lock devices. Both were connected to gravity drainage bags. COMPLICATIONS: None. FINDINGS: Ultrasound confirms the presence of significant hydronephrosis bilaterally. After access of the left renal collecting system and placement of a nephrostomy tube formed in the renal pelvis, there is return of grossly purulent appearing urine. A sample of purulent fluid was sent for culture analysis. After placement of the right nephrostomy tube which was also formed in the renal pelvis, there is return of some blood tinged urine. IMPRESSION: Placement of bilateral 10 French percutaneous nephrostomy tubes formed  at the level of the renal pelvis. Urine from the left kidney was grossly  purulent and a sample sent for culture analysis. Both nephrostomy tubes were connected to gravity drainage bags. Electronically Signed   By: Aletta Edouard M.D.   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 Status post prior left ureteral stent placement in September and right ureteral stent placement in October. Admitted with severe acute kidney injury superimposed on chronic kidney disease, lack of urine output and evidence of bilateral hydronephrosis by imaging. An indwelling right nephrostomy tube has been pulled out for unknown length of time. He now presents for new bilateral percutaneous nephrostomy tube placement for renal decompression to determine degree of recovery will renal function. EXAM: 1. LEFT PERCUTANEOUS NEPHROSTOMY TUBE PLACEMENT 2. RIGHT PERCUTANEOUS NEPHROSTOMY TUBE PLACEMENT COMPARISON:  CT of the abdomen and pelvis on 03/31/2020 ANESTHESIA/SEDATION: Moderate (conscious) sedation was employed during this procedure. A total of Versed 1.0 mg and Fentanyl 50 mcg was administered intravenously. Moderate Sedation Time: 27 minutes. The patient's level of consciousness and vital signs were monitored continuously by radiology nursing throughout the procedure under my direct supervision. CONTRAST:  15 mL Omnipaque 300 MEDICATIONS: 1 g IV Rocephin. Antibiotic was administered in an appropriate time frame prior to skin puncture. FLUOROSCOPY TIME:  1 minute and 54 seconds.  8.0 mGy. PROCEDURE: The procedure, risks, benefits, and alternatives were explained to the patient's son by telephone. Questions regarding the procedure were encouraged and answered. The patient's son understands and consents to the procedure. A  time-out was performed prior to initiating the procedure. Bilateral flank regions were prepped with chlorhexidine in a sterile fashion, and a sterile drape was applied covering the operative field. A sterile gown and sterile gloves were used for the procedure. Local anesthesia was provided with 1% Lidocaine. Ultrasound was used to localize the left kidney. Under direct ultrasound guidance, a 21 gauge needle was advanced into the renal collecting system. Ultrasound image documentation was performed. Aspiration of urine sample was performed followed by contrast injection under fluoroscopy. A transitional dilator was advanced over a guidewire. Percutaneous tract dilatation was then performed over the guidewire. A 10-French percutaneous nephrostomy tube was then advanced and formed in the left renal collecting system. Additional urine sample was aspirated and sent for culture analysis. Catheter position was confirmed by fluoroscopy after contrast injection. Ultrasound was then used to localize the right kidney. A 21 gauge needle was advanced under ultrasound guidance into the right renal collecting system and ultrasound image documentation performed. After aspiration of urine, contrast was injected under fluoroscopy. A transitional dilator was advanced over a guidewire under fluoroscopy. The tract was dilated and a 10 French percutaneous nephrostomy tube advanced and formed in the right renal collecting system. Catheter position was confirmed by fluoroscopy after contrast injection. Both nephrostomy tubes were secured at the skin with Prolene retention sutures and Stat-Lock devices. Both were connected to gravity drainage bags. COMPLICATIONS: None. FINDINGS: Ultrasound confirms the presence of significant hydronephrosis bilaterally. After access of the left renal collecting system and placement of a nephrostomy tube formed in the renal pelvis, there is return of grossly purulent appearing urine. A sample of purulent  fluid was sent for culture analysis. After placement of the right nephrostomy tube which was also formed in the renal pelvis, there is return of some blood tinged urine. IMPRESSION: Placement of bilateral 10 French percutaneous nephrostomy tubes formed at the level of the  renal pelvis. Urine from the left kidney was grossly purulent and a sample sent for culture analysis. Both nephrostomy tubes were connected to gravity drainage bags. Electronically Signed   By: Aletta Edouard M.D.   On: 04/01/2020 14:20    Labs:  CBC: Recent Labs    03/31/20 1420 04/01/20 0213 04/02/20 0312 04/02/20 1533 04/03/20 0401  WBC 16.6* 13.4* 12.5*  --  12.5*  HGB 8.3* 7.0* 6.3* 7.9* 7.1*  HCT 24.7* 19.8* 18.1* 22.2* 20.4*  PLT 262 215 220  --  180    COAGS: Recent Labs    10/17/19 1241 10/17/19 1311 11/07/19 1836 11/07/19 1853 11/10/19 1052 12/08/19 1258 12/27/19 0450 01/26/20 1050 03/31/20 1420  INR 1.5* 1.6*   < >  --    < > 1.1 1.3* 1.2 1.5*  APTT 43* 46*  --  31  --   --   --   --  37*   < > = values in this interval not displayed.    BMP: Recent Labs    01/01/20 0430 01/02/20 0427 01/03/20 0506 01/04/20 0453 01/05/20 0558 04/01/20 0213 04/01/20 0841 04/02/20 0312 04/03/20 0401  NA 143 144 143 143   < > 141 145 142 143  K 3.5 3.9 3.9 3.9   < > 4.2 3.0* 3.0* 3.0*  CL 116* 113* 112* 112*   < > 108 106 102 104  CO2 18* 19* 22 23   < > <7* 9* 18* 17*  GLUCOSE 73 84 81 77   < > 88 76 75 86  BUN 60* 61* 53* 47*   < > 134* 133* 59* 69*  CALCIUM 7.3* 7.5* 7.5* 7.4*   < > 7.3* 6.6* 6.8* 7.3*  CREATININE 6.54* 6.27* 6.42* 5.74*   < > 15.49* 15.34* 7.58* 8.39*  GFRNONAA 9* 9* 9* 10*   < > 3* 3* 8* 7*  GFRAA 10* 11* 10* 12*  --   --   --   --   --    < > = values in this interval not displayed.    LIVER FUNCTION TESTS: Recent Labs    12/28/19 0503 12/30/19 0603 01/06/20 0507 01/07/20 0449 01/08/20 0518 03/31/20 1420  BILITOT 0.3  --   --  0.3 0.7 0.9  AST 28  --   --  37 26  38  ALT 8  --   --  19 17 25   ALKPHOS 87  --   --  86 81 146*  PROT 6.0*  --   --  5.7* 5.6* 8.0  ALBUMIN 2.2*   < > 1.9* 2.0*  2.0* 2.0* 3.4*   < > = values in this interval not displayed.    Assessment and Plan: Bilateral hydronephrosis s/p bilateral PCN placement 1/1 by Dr. Kathlene Cote Intra-abdominal abscess s/p left TG drain placement . Bilateral PCNs appear to be working well.  Gravity bags are accessible with dark, yellow urine.  WBC improved to 12.5 Afebrile overnight.  Culture was sent for analysis, however returned with multiple species not typed. Continues Maxipime.  Ultimate plan for colovesicular fistula is colonoscopy with surgery as an outpatient.  Recommend adding drain to Flowsheets and logging daily output. Defer to Urology for ultimate management/plan for TG drain .  At time of placement in October, patient was to maintain all drains until planned surgical intervention.  IR to follow.   Electronically Signed: Docia Barrier, PA 04/03/2020, 12:01 PM   I spent a total of 15 Minutes  at the the patient's bedside AND on the patient's hospital floor or unit, greater than 50% of which was counseling/coordinating care for bilateral hydronephrosis.

## 2020-04-03 NOTE — Progress Notes (Signed)
Pt is more awake, now alert 1x, able to say name and DOB. Able to follow simple commands though often confuse. Able to respond on questions. Placed mittens as pt was fidgety, tried to pull lines 5x. Pt  yells "fuck you" often to staff and calms down after. However most of the time. Pt still moans.will monitor

## 2020-04-03 NOTE — Progress Notes (Signed)
   04/03/20 0510  Assess: MEWS Score  Temp 98 F (36.7 C)  BP 112/62  Pulse Rate (!) 48  ECG Heart Rate (!) 49  Resp 12  Level of Consciousness Alert  SpO2 100 %  O2 Device Room Air  Assess: MEWS Score  MEWS Temp 0  MEWS Systolic 0  MEWS Pulse 1  MEWS RR 1  MEWS LOC 0  MEWS Score 2  MEWS Score Color Yellow  Assess: if the MEWS score is Yellow or Red  Were vital signs taken at a resting state? Yes  Focused Assessment No change from prior assessment  Early Detection of Sepsis Score *See Row Information* Low  MEWS guidelines implemented *See Row Information* No, previously yellow, continue vital signs every 4 hours  Escalate  MEWS: Escalate Yellow: discuss with charge nurse/RN and consider discussing with provider and RRT  Notify: Charge Nurse/RN  Name of Charge Nurse/RN Notified Alisha RN  Date Charge Nurse/RN Notified 04/03/20  Time Charge Nurse/RN Notified 0215  Notify: Provider  Provider Name/Title DR Myna Hidalgo  Date Provider Notified 04/03/20  Time Provider Notified 0510  Notification Type Page  Notification Reason Change in status  Response No new orders  Date of Provider Response 04/03/20  Time of Provider Response [phone]  Document  Patient Outcome Not stable and remains on department  Progress note created (see row info) Yes  PT on Sinus Brady, worst while asleep. EKG done, BP stable. Pt more awake. Will monitor

## 2020-04-03 NOTE — Progress Notes (Signed)
Pt is now agitated cursing, screaming and demanding to eat.pt not cooperative. He was swinging his hands towards staff, pulling his mittens and pulling his lines, HD cath too. Paged hospitalist , waiting for respond.Pass on Day shift

## 2020-04-03 NOTE — Progress Notes (Signed)
[facility] Progress Note    Assessment/ Plan:   Assessment/Recommendations: Chase TAPP Sr. is a/an 55 y.o. male with a past medical history significant for gastric bypass, hodgkin's lymphoma, colovesical fistula, bilateral ureteral stenosis w/ ckd and hydronephrosiswho present w/ acute renal failure, sepsis, and hydronephrosis  Non-OliguricAKI on CKD V 2/2 urinary obstruction and dehydration w/ severe electrolyte derrangements.CKD secondary to longstanding obstruction. AKI likely dehydration with obstruction contributing also with sepsis likely leading to ATN. Severe electrolyte derangements with severe acidosis and likely uremia.  - HD #1 1/121, HD 04/04/19 today--> he is making fairly good UOP so hopeful for recovery -Continue with IV hydration with LR -Appreciate IR help with temporary HD catheter and nephrostomy tubes - he's better per notes but still not able to engage in a whole lot of discussion today (at least for me) - appreciate palliative care - he appears quite frail and therefore is a suboptimal dialysis candidate  Bilateral hydronephrosis: History of such presenting with removal of his nephrostomy tubes and hydronephrosis. IR consulted placed bilateral PCN, appreciate help.    Sepsis: Likely urosepsis. Blood cultures no growth for 48 hours.   Urine cultures with multiple organisms suggestive recollection but likely unnecessary, this is likely multimicrobial given the urine cultures from each nephrostomy tube and urine grew the same thing.  Continue antibiotics per primary team  Severe metabolic acidosis: pH 1.324 now significantly improved with IV bicarbonate and dialysis.  Hypokalemia: replete prn  Anemia: Multifactorial with hemoglobin less than 7 today.  Transfusion per primary team.  Consider ESA in the future  Hyperphosphatemia: Phosphorus checked and greater than 30. Dialysis and PCN placement as above. Recheck today   Subjective:     Seen on dialysis.  More coherent today than what prior notes have described.  Agitated earlier this AM but this seems to have resolved.   Objective:   BP 124/81   Pulse (!) 47   Temp 97.7 F (36.5 C) (Oral)   Resp 15   Ht 5\' 6"  (1.676 m)   Wt 56.1 kg   SpO2 100%   BMI 19.96 kg/m   Intake/Output Summary (Last 24 hours) at 04/03/2020 1301 Last data filed at 04/03/2020 0325 Gross per 24 hour  Intake 1105.44 ml  Output 710 ml  Net 395.44 ml   Weight change: 2.1 kg  Physical Exam: Gen: NAD, lying curled up in the bed, frail and pale CVS: RRR Resp: clear Abd: soft GU: bilateral PCN tubes in place, draining yellow urine Ext: no LE edema  Imaging: IR Fluoro Guide CV Line Right  Result Date: 04/01/2020 CLINICAL DATA:  Renal failure and need for emergent hemodialysis. EXAM: NON-TUNNELED CENTRAL VENOUS HEMODIALYSIS CATHETER PLACEMENT WITH ULTRASOUND AND FLUOROSCOPIC GUIDANCE FLUOROSCOPY TIME:  30 seconds. PROCEDURE: The procedure, risks, benefits, and alternatives were explained to the patient's son. Questions regarding the procedure were encouraged and answered. The patient's son understands and consents to the procedure. A time-out was performed prior to initiating the procedure. Ultrasound was used to confirm patency of the right internal jugular vein. The right neck and chest were prepped with chlorhexidine in a sterile fashion, and a sterile drape was applied covering the operative field. Maximum barrier sterile technique with sterile gowns and gloves were used for the procedure. Local anesthesia was provided with 1% lidocaine. After creating a small venotomy incision, a 21 gauge needle was advanced into the right internal jugular vein under direct, real-time ultrasound guidance. Ultrasound image documentation was performed. After securing guidewire access  venous access was dilated over a guidewire. A 16 cm, 13 French triple-lumen Mahurkar non tunneled hemodialysis catheter was advanced  over the wire. Final catheter positioning was confirmed and documented with a fluoroscopic spot image. The catheter was aspirated, flushed with saline, and injected with appropriate volume heparin dwells. The catheter exit site was secured with 0-Prolene retention sutures. COMPLICATIONS: None.  No pneumothorax. FINDINGS: After catheter placement, the tip lies at the cavoatrial junction. The catheter aspirates normally and is ready for immediate use. IMPRESSION: Placement of non-tunneled central venous hemodialysis catheter via the right internal jugular vein. The catheter tip lies at the cavoatrial junction. The catheter is ready for immediate use. Electronically Signed   By: Aletta Edouard M.D.   On: 04/01/2020 14:26   IR US Guide Vasc Access Right  Result Date: 04/01/2020 CLINICAL DATA:  Renal failure and need for emergent hemodialysis. EXAM: NON-TUNNELED CENTRAL VENOUS HEMODIALYSIS CATHETER PLACEMENT WITH ULTRASOUND AND FLUOROSCOPIC GUIDANCE FLUOROSCOPY TIME:  30 seconds. PROCEDURE: The procedure, risks, benefits, and alternatives were explained to the patient's son. Questions regarding the procedure were encouraged and answered. The patient's son understands and consents to the procedure. A time-out was performed prior to initiating the procedure. Ultrasound was used to confirm patency of the right internal jugular vein. The right neck and chest were prepped with chlorhexidine in a sterile fashion, and a sterile drape was applied covering the operative field. Maximum barrier sterile technique with sterile gowns and gloves were used for the procedure. Local anesthesia was provided with 1% lidocaine. After creating a small venotomy incision, a 21 gauge needle was advanced into the right internal jugular vein under direct, real-time ultrasound guidance. Ultrasound image documentation was performed. After securing guidewire access venous access was dilated over a guidewire. A 16 cm, 13 French triple-lumen  Mahurkar non tunneled hemodialysis catheter was advanced over the wire. Final catheter positioning was confirmed and documented with a fluoroscopic spot image. The catheter was aspirated, flushed with saline, and injected with appropriate volume heparin dwells. The catheter exit site was secured with 0-Prolene retention sutures. COMPLICATIONS: None.  No pneumothorax. FINDINGS: After catheter placement, the tip lies at the cavoatrial junction. The catheter aspirates normally and is ready for immediate use. IMPRESSION: Placement of non-tunneled central venous hemodialysis catheter via the right internal jugular vein. The catheter tip lies at the cavoatrial junction. The catheter is ready for immediate use. Electronically Signed   By: Aletta Edouard M.D.   On: 04/01/2020 14:26   DG CHEST PORT 1 VIEW  Result Date: 04/02/2020 CLINICAL DATA:  Evaluate for pulmonary pathology. EXAM: PORTABLE CHEST 1 VIEW COMPARISON:  April 01, 2020 11:40 p.m. FINDINGS: The mediastinal contour and cardiac silhouette are stable. Right central venous line is identified with distal tip in the superior vena cava/right atrium. Consolidation of medial right lung base is identified. The left lung is clear. The bony structures are stable. IMPRESSION: Right lung base pneumonia. Electronically Signed   By: Abelardo Diesel M.D.   On: 04/02/2020 08:21   DG Chest Port 1 View  Result Date: 04/01/2020 CLINICAL DATA:  Acute respiratory distress EXAM: PORTABLE CHEST 1 VIEW COMPARISON:  03/31/2020 FINDINGS: Elevation of the right hemidiaphragm again noted with associated right basilar atelectasis. Left lung clear. No pneumothorax or pleural effusion. Right internal jugular central venous catheter tip noted at the superior cavoatrial junction. Cardiac size within normal limits. Pulmonary vascularity is normal. No acute bone abnormality. IMPRESSION: No active disease. Electronically Signed   By: Fidela Salisbury MD  On: 04/01/2020 23:48   IR NEPHROSTOMY  PLACEMENT LEFT  Result Date: 04/01/2020 CLINICAL DATA:  History of chronic pelvic abscess with colovesical fistula and bilateral ureteral obstruction requiring prior bilateral percutaneous nephrostomy tube placement. Status post prior left ureteral stent placement in September and right ureteral stent placement in October. Admitted with severe acute kidney injury superimposed on chronic kidney disease, lack of urine output and evidence of bilateral hydronephrosis by imaging. An indwelling right nephrostomy tube has been pulled out for unknown length of time. He now presents for new bilateral percutaneous nephrostomy tube placement for renal decompression to determine degree of recovery will renal function. EXAM: 1. LEFT PERCUTANEOUS NEPHROSTOMY TUBE PLACEMENT 2. RIGHT PERCUTANEOUS NEPHROSTOMY TUBE PLACEMENT COMPARISON:  CT of the abdomen and pelvis on 03/31/2020 ANESTHESIA/SEDATION: Moderate (conscious) sedation was employed during this procedure. A total of Versed 1.0 mg and Fentanyl 50 mcg was administered intravenously. Moderate Sedation Time: 27 minutes. The patient's level of consciousness and vital signs were monitored continuously by radiology nursing throughout the procedure under my direct supervision. CONTRAST:  15 mL Omnipaque 300 MEDICATIONS: 1 g IV Rocephin. Antibiotic was administered in an appropriate time frame prior to skin puncture. FLUOROSCOPY TIME:  1 minute and 54 seconds.  8.0 mGy. PROCEDURE: The procedure, risks, benefits, and alternatives were explained to the patient's son by telephone. Questions regarding the procedure were encouraged and answered. The patient's son understands and consents to the procedure. A time-out was performed prior to initiating the procedure. Bilateral flank regions were prepped with chlorhexidine in a sterile fashion, and a sterile drape was applied covering the operative field. A sterile gown and sterile gloves were used for the procedure. Local anesthesia was  provided with 1% Lidocaine. Ultrasound was used to localize the left kidney. Under direct ultrasound guidance, a 21 gauge needle was advanced into the renal collecting system. Ultrasound image documentation was performed. Aspiration of urine sample was performed followed by contrast injection under fluoroscopy. A transitional dilator was advanced over a guidewire. Percutaneous tract dilatation was then performed over the guidewire. A 10-French percutaneous nephrostomy tube was then advanced and formed in the left renal collecting system. Additional urine sample was aspirated and sent for culture analysis. Catheter position was confirmed by fluoroscopy after contrast injection. Ultrasound was then used to localize the right kidney. A 21 gauge needle was advanced under ultrasound guidance into the right renal collecting system and ultrasound image documentation performed. After aspiration of urine, contrast was injected under fluoroscopy. A transitional dilator was advanced over a guidewire under fluoroscopy. The tract was dilated and a 10 French percutaneous nephrostomy tube advanced and formed in the right renal collecting system. Catheter position was confirmed by fluoroscopy after contrast injection. Both nephrostomy tubes were secured at the skin with Prolene retention sutures and Stat-Lock devices. Both were connected to gravity drainage bags. COMPLICATIONS: None. FINDINGS: Ultrasound confirms the presence of significant hydronephrosis bilaterally. After access of the left renal collecting system and placement of a nephrostomy tube formed in the renal pelvis, there is return of grossly purulent appearing urine. A sample of purulent fluid was sent for culture analysis. After placement of the right nephrostomy tube which was also formed in the renal pelvis, there is return of some blood tinged urine. IMPRESSION: Placement of bilateral 10 French percutaneous nephrostomy tubes formed at the level of the renal  pelvis. Urine from the left kidney was grossly purulent and a sample sent for culture analysis. Both nephrostomy tubes were connected to gravity drainage bags. Electronically  Signed   By: Aletta Edouard M.D.   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 Status post prior left ureteral stent placement in September and right ureteral stent placement in October. Admitted with severe acute kidney injury superimposed on chronic kidney disease, lack of urine output and evidence of bilateral hydronephrosis by imaging. An indwelling right nephrostomy tube has been pulled out for unknown length of time. He now presents for new bilateral percutaneous nephrostomy tube placement for renal decompression to determine degree of recovery will renal function. EXAM: 1. LEFT PERCUTANEOUS NEPHROSTOMY TUBE PLACEMENT 2. RIGHT PERCUTANEOUS NEPHROSTOMY TUBE PLACEMENT COMPARISON:  CT of the abdomen and pelvis on 03/31/2020 ANESTHESIA/SEDATION: Moderate (conscious) sedation was employed during this procedure. A total of Versed 1.0 mg and Fentanyl 50 mcg was administered intravenously. Moderate Sedation Time: 27 minutes. The patient's level of consciousness and vital signs were monitored continuously by radiology nursing throughout the procedure under my direct supervision. CONTRAST:  15 mL Omnipaque 300 MEDICATIONS: 1 g IV Rocephin. Antibiotic was administered in an appropriate time frame prior to skin puncture. FLUOROSCOPY TIME:  1 minute and 54 seconds.  8.0 mGy. PROCEDURE: The procedure, risks, benefits, and alternatives were explained to the patient's son by telephone. Questions regarding the procedure were encouraged and answered. The patient's son understands and consents to the procedure. A time-out was performed prior to  initiating the procedure. Bilateral flank regions were prepped with chlorhexidine in a sterile fashion, and a sterile drape was applied covering the operative field. A sterile gown and sterile gloves were used for the procedure. Local anesthesia was provided with 1% Lidocaine. Ultrasound was used to localize the left kidney. Under direct ultrasound guidance, a 21 gauge needle was advanced into the renal collecting system. Ultrasound image documentation was performed. Aspiration of urine sample was performed followed by contrast injection under fluoroscopy. A transitional dilator was advanced over a guidewire. Percutaneous tract dilatation was then performed over the guidewire. A 10-French percutaneous nephrostomy tube was then advanced and formed in the left renal collecting system. Additional urine sample was aspirated and sent for culture analysis. Catheter position was confirmed by fluoroscopy after contrast injection. Ultrasound was then used to localize the right kidney. A 21 gauge needle was advanced under ultrasound guidance into the right renal collecting system and ultrasound image documentation performed. After aspiration of urine, contrast was injected under fluoroscopy. A transitional dilator was advanced over a guidewire under fluoroscopy. The tract was dilated and a 10 French percutaneous nephrostomy tube advanced and formed in the right renal collecting system. Catheter position was confirmed by fluoroscopy after contrast injection. Both nephrostomy tubes were secured at the skin with Prolene retention sutures and Stat-Lock devices. Both were connected to gravity drainage bags. COMPLICATIONS: None. FINDINGS: Ultrasound confirms the presence of significant hydronephrosis bilaterally. After access of the left renal collecting system and placement of a nephrostomy tube formed in the renal pelvis, there is return of grossly purulent appearing urine. A sample of purulent fluid was sent for culture  analysis. After placement of the right nephrostomy tube which was also formed in the renal pelvis, there is return of some blood tinged urine. IMPRESSION: Placement of bilateral 10 French percutaneous nephrostomy tubes formed at the level of the renal pelvis. Urine from the left kidney was grossly purulent and a sample sent for culture analysis. Both  nephrostomy tubes were connected to gravity drainage bags. Electronically Signed   By: Aletta Edouard M.D.   On: 04/01/2020 14:20    Labs: BMET Recent Labs  Lab 03/31/20 1420 04/01/20 0213 04/01/20 0841 04/02/20 0312 04/03/20 0401  NA 133* 141 145 142 143  K 5.1 4.2 3.0* 3.0* 3.0*  CL 108 108 106 102 104  CO2 <7* <7* 9* 18* 17*  GLUCOSE 126* 88 76 75 86  BUN 136* 134* 133* 59* 69*  CREATININE 16.69* 15.49* 15.34* 7.58* 8.39*  CALCIUM 7.9* 7.3* 6.6* 6.8* 7.3*  PHOS  --  >30.0*  --  4.2  --    CBC Recent Labs  Lab 03/31/20 1420 04/01/20 0213 04/02/20 0312 04/02/20 1533 04/03/20 0401  WBC 16.6* 13.4* 12.5*  --  12.5*  NEUTROABS 15.1*  --   --   --   --   HGB 8.3* 7.0* 6.3* 7.9* 7.1*  HCT 24.7* 19.8* 18.1* 22.2* 20.4*  MCV 92.2 88.8 85.4  --  85.7  PLT 262 215 220  --  180    Medications:    . Chlorhexidine Gluconate Cloth  6 each Topical Daily  . DULoxetine  60 mg Oral Daily   And  . DULoxetine  30 mg Oral QHS  . heparin  5,000 Units Subcutaneous Q8H  . pantoprazole (PROTONIX) IV  40 mg Intravenous Q24H  . sevelamer carbonate  800 mg Oral TID WC  . sodium bicarbonate  650 mg Oral BID  . sodium chloride flush  5 mL Intracatheter Q8H  . tamsulosin  0.4 mg Oral QHS      Madelon Lips, MD 04/03/2020, 1:01 PM

## 2020-04-04 DIAGNOSIS — R531 Weakness: Secondary | ICD-10-CM | POA: Diagnosis not present

## 2020-04-04 DIAGNOSIS — N135 Crossing vessel and stricture of ureter without hydronephrosis: Secondary | ICD-10-CM | POA: Diagnosis not present

## 2020-04-04 DIAGNOSIS — C8114 Nodular sclerosis classical Hodgkin lymphoma, lymph nodes of axilla and upper limb: Secondary | ICD-10-CM | POA: Diagnosis not present

## 2020-04-04 DIAGNOSIS — A419 Sepsis, unspecified organism: Secondary | ICD-10-CM | POA: Diagnosis not present

## 2020-04-04 DIAGNOSIS — R627 Adult failure to thrive: Secondary | ICD-10-CM | POA: Diagnosis not present

## 2020-04-04 DIAGNOSIS — Z515 Encounter for palliative care: Secondary | ICD-10-CM | POA: Diagnosis not present

## 2020-04-04 DIAGNOSIS — N179 Acute kidney failure, unspecified: Secondary | ICD-10-CM | POA: Diagnosis not present

## 2020-04-04 LAB — BPAM RBC
Blood Product Expiration Date: 202201132359
Blood Product Expiration Date: 202202082359
ISSUE DATE / TIME: 202201020513
Unit Type and Rh: 5100
Unit Type and Rh: 5100

## 2020-04-04 LAB — CBC WITH DIFFERENTIAL/PLATELET
Abs Immature Granulocytes: 0.07 10*3/uL (ref 0.00–0.07)
Basophils Absolute: 0 10*3/uL (ref 0.0–0.1)
Basophils Relative: 0 %
Eosinophils Absolute: 0.5 10*3/uL (ref 0.0–0.5)
Eosinophils Relative: 5 %
HCT: 23.7 % — ABNORMAL LOW (ref 39.0–52.0)
Hemoglobin: 8.3 g/dL — ABNORMAL LOW (ref 13.0–17.0)
Immature Granulocytes: 1 %
Lymphocytes Relative: 11 %
Lymphs Abs: 1.1 10*3/uL (ref 0.7–4.0)
MCH: 31.1 pg (ref 26.0–34.0)
MCHC: 35 g/dL (ref 30.0–36.0)
MCV: 88.8 fL (ref 80.0–100.0)
Monocytes Absolute: 0.3 10*3/uL (ref 0.1–1.0)
Monocytes Relative: 3 %
Neutro Abs: 8.2 10*3/uL — ABNORMAL HIGH (ref 1.7–7.7)
Neutrophils Relative %: 80 %
Platelets: 166 10*3/uL (ref 150–400)
RBC: 2.67 MIL/uL — ABNORMAL LOW (ref 4.22–5.81)
RDW: 15.9 % — ABNORMAL HIGH (ref 11.5–15.5)
WBC: 10.2 10*3/uL (ref 4.0–10.5)
nRBC: 0.2 % (ref 0.0–0.2)

## 2020-04-04 LAB — TYPE AND SCREEN
ABO/RH(D): O POS
Antibody Screen: NEGATIVE
Unit division: 0
Unit division: 0

## 2020-04-04 LAB — BASIC METABOLIC PANEL
Anion gap: 12 (ref 5–15)
BUN: 28 mg/dL — ABNORMAL HIGH (ref 6–20)
CO2: 23 mmol/L (ref 22–32)
Calcium: 7 mg/dL — ABNORMAL LOW (ref 8.9–10.3)
Chloride: 101 mmol/L (ref 98–111)
Creatinine, Ser: 4.22 mg/dL — ABNORMAL HIGH (ref 0.61–1.24)
GFR, Estimated: 16 mL/min — ABNORMAL LOW (ref >60)
Glucose, Bld: 98 mg/dL (ref 70–99)
Potassium: 3.4 mmol/L — ABNORMAL LOW (ref 3.5–5.1)
Sodium: 136 mmol/L (ref 135–145)

## 2020-04-04 LAB — CBC
HCT: 20.7 % — ABNORMAL LOW (ref 39.0–52.0)
Hemoglobin: 7 g/dL — ABNORMAL LOW (ref 13.0–17.0)
MCH: 29.7 pg (ref 26.0–34.0)
MCHC: 33.8 g/dL (ref 30.0–36.0)
MCV: 87.7 fL (ref 80.0–100.0)
Platelets: 159 10*3/uL (ref 150–400)
RBC: 2.36 MIL/uL — ABNORMAL LOW (ref 4.22–5.81)
RDW: 16.5 % — ABNORMAL HIGH (ref 11.5–15.5)
WBC: 10.3 10*3/uL (ref 4.0–10.5)
nRBC: 0 % (ref 0.0–0.2)

## 2020-04-04 LAB — PREPARE RBC (CROSSMATCH)

## 2020-04-04 LAB — GLUCOSE, CAPILLARY: Glucose-Capillary: 89 mg/dL (ref 70–99)

## 2020-04-04 MED ORDER — CALCIUM GLUCONATE-NACL 2-0.675 GM/100ML-% IV SOLN
2.0000 g | Freq: Once | INTRAVENOUS | Status: AC
  Administered 2020-04-04: 2000 mg via INTRAVENOUS
  Filled 2020-04-04: qty 100

## 2020-04-04 MED ORDER — PANTOPRAZOLE SODIUM 40 MG PO TBEC
40.0000 mg | DELAYED_RELEASE_TABLET | Freq: Every day | ORAL | Status: DC
  Administered 2020-04-04 – 2020-05-02 (×28): 40 mg via ORAL
  Filled 2020-04-04 (×28): qty 1

## 2020-04-04 MED ORDER — SODIUM CHLORIDE 0.9% IV SOLUTION: Freq: Once | INTRAVENOUS | Status: AC

## 2020-04-04 NOTE — Progress Notes (Signed)
[facility] PROGRESS NOTE    Progress Note  Chase [name]  AYT:[id number] DOB: 05-27-1965 DOA: 03/31/2020 PCP: [facility], [name]     Brief Narrative:   Chase [name]. is an 55 y.o. 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  Creatinine of 16 (in October 5 0.7) with a leukocytosis CT scan showed bilateral hydronephrosis with a left transgluteal drainage without fluid collection and a large colonic stool burden  Assessment/Plan:   Acute on chronic kidney disease stage IV-V likely due to removal of his nephrostomy tube in the setting of obstructive uropathy: Nephrology was consulted recommended HD, dialysis catheter was placed on 04/01/2020 and HD was attempted which was not successful due to hypotension. Hypotension has resolved. IR also placed on 04/01/2020 his bilateral nephrostomy tubes. His urine output is slowing down. Nephrology recommended dialysis on 04/03/2020.   His baseline creatinine is around 3-5. Continue monitor I's and O's and daily weights. Further management per renal he is a poor candidate for HD as an outpatient.  Bilateral hydronephrosis: Tubes were removed accidentally, IR was reconsulted and tubes were replaced on 04/01/2020  Severe sepsis secondary to UTI: Encephalopathic and hypothermic on admission with a leukocytosis and blood pressure stable. Blood cultures of show no growth till date, they have shown multiple species. CT scan of the abdomen pelvis showed no abscesses. Multiple urine cultures  were sent that showed multiple species, has been leukocytosis is resolved blood pressure is improved. Continue IV cefepime for total of 7 days.  Normocytic anemia: He status post 2 units of packed red blood cells, his hemoglobin is slowly drifting down, no signs of overt bleeding we will transfuse an additional unit of packed red blood cells.  Acute uremic encephalopathy: In the setting of infectious etiology, medication toxicity and rising BUN. He is significantly more awake this morning able to carry on a conversation he relates he is hungry. Improving but not quite a baseline, holding all sedatives.  Cymbalta oxycodone and Neurontin.  History of pelvic abscess on 11/15/2019 he is status post ATG drain in place by IR /Colovesicular fistula: He has a transgluteal drain placed for his abscess with no significant fluid collection seen on CT scan. He is planned for colonoscopy and surgery in January 2022. Further management per surgery as an outpatient.  Chronic pain: Hold sedative.  History of CVA: Ischemic in July 2021 history of A. fib not a candidate for anticoagulation due to GI bleed and thrombocytopenia.  PTSD: Hold sedatives.  Normocytic anemia: With a hemoglobin of 8.3.  History of gastric bypass: Continue PPI.  History of Hodgkin's lymphoma: Noted.  Severe protein caloric malnutrition: Continue Ensure 3 times daily.  Goals of care/ethics: Hospice imperative care to discussed with family end-of-life, he is a poor candidate for dialysis as an outpatient.  Sacral decubitus ulcer stage II present on admission RN Pressure Injury Documentation: Pressure Injury 11/08/19 Coccyx Upper Stage 2 -  Partial thickness loss of dermis presenting as a shallow open injury with a red, pink wound bed without slough. unblanchable with an open slit in the middle non draining  (Active)  11/08/19  0300  Location: Coccyx  Location Orientation: Upper  Staging: Stage 2 -  Partial thickness  loss of dermis presenting as a shallow open injury with a red, pink wound bed without slough.  Wound Description (Comments): unblanchable with an open slit in the middle non draining   Present on Admission: Yes   Goals of care/ethics: He has an extremely poor prognosis, I have expressed this to the family Perative care has been consulted and working with family.  DVT prophylaxis: lovenox Family Communication: I have been unable to contact family. Status is: Inpatient  Remains inpatient appropriate because:Hemodynamically unstable   Dispo: The patient is from: SNF              Anticipated d/c is to: SNF              Anticipated d/c date is: > 3 days              Patient currently is not medically stable to d/c.        Code Status:     Code Status Orders  (From admission, onward)         Start     Ordered   03/31/20 2152  Full code  Continuous        03/31/20 2155        Code Status History    Date Active Date Inactive Code Status Order ID Comments User Context   12/24/2019 1537 01/08/2020 2106 Full Code [id number]  Jonnie Finner, DO ED   11/08/2019 0019 11/19/2019 2232 Full Code [id number]  Jacalyn Lefevre, MD ED   10/17/2019 1832 10/26/2019 1620 Full Code [id number]  Mendel Corning, MD Inpatient   Advance Care Planning Activity        IV Access:    Peripheral IV   Procedures and diagnostic studies:   No results found.   Medical Consultants:    None.  Anti-Infectives:   Cefepime  Subjective:    Chase Saxon Sr. patient was not in a good mood this morning.  Objective:    Vitals:   04/04/20 0400 04/04/20 0500 04/04/20 0521 04/04/20 0728  BP: 118/69   (!) 107/57  Pulse: (!) 38 (!) 58    Resp: 15   20  Temp: 98.4 F (36.9 C)   98.1 F (36.7 C)  TempSrc: Oral   Oral  SpO2: 99% 99%    Weight:   58.3 kg   Height:       SpO2: 99 % O2 Flow Rate (L/min): 10 L/min   Intake/Output Summary (Last 24 hours) at 04/04/2020 0934 Last data filed at  04/04/2020 0906 Gross per 24 hour  Intake 4507.36 ml  Output 550 ml  Net 3957.36 ml   Filed Weights   04/03/20 0344 04/03/20 1212 04/04/20 0521  Weight: 56.1 kg 56.6 kg 58.3 kg    Exam: General exam: In no acute distress. Respiratory system: Good air movement and clear to auscultation. Cardiovascular system: S1 & S2 heard, RRR. No JVD. Gastrointestinal system: Abdomen is nondistended, soft and nontender.  Extremities: No pedal edema. Skin: No rashes, lesions or ulcers  Data Reviewed:    Labs: Basic Metabolic Panel: Recent Labs  Lab 04/01/20 0213 04/01/20 0841 04/02/20 0312 04/03/20 0401 04/04/20 0447  NA 141 145 142 143 136  K 4.2 3.0* 3.0* 3.0* 3.4*  CL 108 106 102 104 101  CO2 <7* 9* 18* 17* 23  GLUCOSE 88 76 75 86 98  BUN 134*  133* 59* 69* 28*  CREATININE 15.49* 15.34* 7.58* 8.39* 4.22*  CALCIUM 7.3* 6.6* 6.8* 7.3* 7.0*  MG 2.2  --   --   --   --   PHOS >30.0*  --  4.2  --   --    GFR Estimated Creatinine Clearance: 16.1 mL/min (A) (by C-G formula based on SCr of 4.22 mg/dL (H)). Liver Function Tests: Recent Labs  Lab 03/31/20 1420  AST 38  ALT 25  ALKPHOS 146*  BILITOT 0.9  PROT 8.0  ALBUMIN 3.4*   No results for input(s): LIPASE, AMYLASE in the last 168 hours. No results for input(s): AMMONIA in the last 168 hours. Coagulation profile Recent Labs  Lab 03/31/20 1420  INR 1.5*   COVID-19 Labs  No results for input(s): DDIMER, FERRITIN, LDH, CRP in the last 72 hours.  Lab Results  Component Value Date   SARSCOV2NAA NEGATIVE 03/31/2020   [place] NEGATIVE 01/07/2020   [place] NEGATIVE 12/24/2019   [place] NEGATIVE 11/19/2019    CBC: Recent Labs  Lab 03/31/20 1420 04/01/20 0213 04/02/20 0312 04/02/20 1533 04/03/20 0401 04/04/20 0447  WBC 16.6* 13.4* 12.5*  --  12.5* 10.3  NEUTROABS 15.1*  --   --   --   --   --   HGB 8.3* 7.0* 6.3* 7.9* 7.1* 7.0*  HCT 24.7* 19.8* 18.1* 22.2* 20.4* 20.7*  MCV 92.2 88.8 85.4  --  85.7 87.7   PLT 262 215 220  --  180 159   Cardiac Enzymes: No results for input(s): CKTOTAL, CKMB, CKMBINDEX, TROPONINI in the last 168 hours. BNP (last 3 results) No results for input(s): PROBNP in the last 8760 hours. CBG: Recent Labs  Lab 04/02/20 2153 04/02/20 2333 04/03/20 0343 04/03/20 2103 04/04/20 0516  GLUCAP 85 88 87 112* 89   D-Dimer: No results for input(s): DDIMER in the last 72 hours. Hgb A1c: No results for input(s): HGBA1C in the last 72 hours. Lipid Profile: No results for input(s): CHOL, HDL, LDLCALC, TRIG, CHOLHDL, LDLDIRECT in the last 72 hours. Thyroid function studies: Recent Labs    04/03/20 1004  TSH 1.382   Anemia work up: No results for input(s): VITAMINB12, FOLATE, FERRITIN, TIBC, IRON, RETICCTPCT in the last 72 hours. Sepsis Labs: Recent Labs  Lab 03/31/20 1420 03/31/20 1641 04/01/20 0213 04/02/20 0312 04/03/20 0401 04/04/20 0447  WBC 16.6*  --  13.4* 12.5* 12.5* 10.3  LATICACIDVEN 0.9 0.6  --   --   --   --    Microbiology Recent Results (from the past 240 hour(s))  Urine culture     Status: Abnormal   Collection Time: 03/31/20  2:07 PM   Specimen: In/Out Cath Urine  Result Value Ref Range Status   Specimen Description   Final    IN/OUT CATH URINE Performed at [facility], [address] [place], [place] [address]    Special Requests   Final    NONE Performed at [facility], [address] [place], [place] [address]    Culture MULTIPLE SPECIES PRESENT, SUGGEST RECOLLECTION (A)  Final   Report Status 04/02/2020 FINAL  Final  Blood Culture (routine x 2)     Status: None (Preliminary result)   Collection Time: 03/31/20  2:20 PM   Specimen: BLOOD  Result Value Ref Range Status   Specimen Description BLOOD BLOOD LEFT FOREARM  Final   Special Requests   Final    BOTTLES DRAWN AEROBIC AND ANAEROBIC Blood Culture adequate volume   Culture  Final    NO GROWTH 3 DAYS Performed at [facility], [address] [place], [place] [address]     Report Status PENDING  Incomplete  Blood Culture (routine x 2)     Status: None (Preliminary result)   Collection Time: 03/31/20  2:20 PM   Specimen: BLOOD  Result Value Ref Range Status   Specimen Description BLOOD LEFT ANTECUBITAL  Final   Special Requests   Final    BOTTLES DRAWN AEROBIC AND ANAEROBIC Blood Culture adequate volume   Culture   Final    NO GROWTH 3 DAYS Performed at [facility], [address], [place], [place] [address]    Report Status PENDING  Incomplete  Resp Panel by RT-PCR (Flu A&B, Covid) Nasopharyngeal Swab     Status: None   Collection Time: 03/31/20  2:48 PM   Specimen: Nasopharyngeal Swab; Nasopharyngeal(NP) swabs in vial transport medium  Result Value Ref Range Status   SARS Coronavirus 2 by RT PCR NEGATIVE NEGATIVE Final    Comment: (NOTE) SARS-CoV-2 target nucleic acids are NOT DETECTED.  The SARS-CoV-2 RNA is generally detectable in upper respiratory specimens during the acute phase of infection. The lowest concentration of SARS-CoV-2 viral copies this assay can detect is 138 copies/mL. A negative result does not preclude SARS-Cov-2 infection and should not be used as the sole basis for treatment or other patient management decisions. A negative result may occur with  improper specimen collection/handling, submission of specimen other than nasopharyngeal swab, presence of viral mutation(s) within the areas targeted by this assay, and inadequate number of viral copies(<138 copies/mL). A negative result must be combined with clinical observations, patient history, and epidemiological information. The expected result is Negative.  Fact Sheet for Patients:  EntrepreneurPulse.com.au  Fact Sheet for Healthcare Providers:  IncredibleEmployment.be  This test is no t yet approved or cleared by the [place] FDA and  has been authorized for detection and/or diagnosis of SARS-CoV-2 by FDA under an Emergency Use  Authorization (EUA). This EUA will remain  in effect (meaning this test can be used) for the duration of the COVID-19 declaration under Section 564(b)(1) of the Act, 21 U.S.C.section 360bbb-3(b)(1), unless the authorization is terminated  or revoked sooner.       Influenza A by PCR NEGATIVE NEGATIVE Final   Influenza B by PCR NEGATIVE NEGATIVE Final    Comment: (NOTE) The Xpert Xpress SARS-CoV-2/FLU/RSV plus assay is intended as an aid in the diagnosis of influenza from Nasopharyngeal swab specimens and should not be used as a sole basis for treatment. Nasal washings and aspirates are unacceptable for Xpert Xpress SARS-CoV-2/FLU/RSV testing.  Fact Sheet for Patients: EntrepreneurPulse.com.au  Fact Sheet for Healthcare Providers: IncredibleEmployment.be  This test is not yet approved or cleared by the [place] FDA and has been authorized for detection and/or diagnosis of SARS-CoV-2 by FDA under an Emergency Use Authorization (EUA). This EUA will remain in effect (meaning this test can be used) for the duration of the COVID-19 declaration under Section 564(b)(1) of the Act, 21 U.S.C. section 360bbb-3(b)(1), unless the authorization is terminated or revoked.  Performed at [facility], [address], [place],  [address]   Urine Culture     Status: Abnormal   Collection Time: 03/31/20 10:27 PM   Specimen: In/Out Cath Urine  Result Value Ref Range Status   Specimen Description IN/OUT CATH URINE  Final   Special Requests   Final    NONE Performed at [facility], [place]  [address], [place], [place] [address]    Culture MULTIPLE SPECIES PRESENT, SUGGEST RECOLLECTION (A)  Final   Report Status 04/02/2020 FINAL  Final  Urine culture     Status: Abnormal   Collection Time: 04/01/20  1:57 PM   Specimen: Urine, Random  Result Value Ref Range Status   Specimen Description URINE, RANDOM  Final   Special Requests   Final    LEFT  KIDNEY Performed at [facility], [place] [address], [place], [place] [address]    Culture MULTIPLE SPECIES PRESENT, SUGGEST RECOLLECTION (A)  Final   Report Status 04/02/2020 FINAL  Final     Medications:   . Chlorhexidine Gluconate Cloth  6 each Topical Daily  . DULoxetine  60 mg Oral Daily   And  . DULoxetine  30 mg Oral QHS  . heparin  5,000 Units Subcutaneous Q8H  . pantoprazole  40 mg Oral QHS  . sevelamer carbonate  800 mg Oral TID WC  . sodium bicarbonate  650 mg Oral BID  . sodium chloride flush  5 mL Intracatheter Q8H  . tamsulosin  0.4 mg Oral QHS   Continuous Infusions: . ceFEPime (MAXIPIME) IV 1 g (04/03/20 1618)  . lactated ringers with kcl 100 mL/hr at 04/04/20 0351      LOS: 4 days   Charlynne Cousins  [facility]  04/04/2020, 9:34 AM

## 2020-04-04 NOTE — Progress Notes (Signed)
Pt is verbally aggressive and disoriented this am.  Refused some meds and all care.

## 2020-04-04 NOTE — Progress Notes (Signed)
Referring Physician(s): Opyd, Ilene Qua [facility]); Santiago Bumpers J(nephorlogy)  Supervising Physician: Arne Cleveland  Patient Status:  [name] - In-pt  Chief Complaint: None  Subjective:  Urinary obstruction secondary to pelvic mass with subsequent bilateral hydronephrosisin setting of bilateral ureteral stents, with bilateral PCNs inadvertently removed (unknown how long they have not been in place) s/p bilateral PCN placements in IR 04/01/2020. Pelvic abscesswith evidence of colovesical fistulas/p left TG drain placement in IR 11/15/2019. Patient laying in bed resting comfortably. Appears more alert today- responds to voice and answers questions appropriately. Bilateral PCN and left TG sites c/d/i.   Allergies: Nsaids and Ciprofloxacin  Medications: Prior to Admission medications   Medication Sig Start Date End Date Taking? Authorizing Provider  docusate sodium (COLACE) 100 MG capsule Take 100 mg by mouth 2 (two) times daily as needed (constipation).   Yes [provider]  Cholecalciferol (VITAMIN D3) 20 MCG (800 UNIT) TABS Take 800 Units by mouth daily.    [provider]  cyanocobalamin (,VITAMIN B-12,) 1000 MCG/ML injection Inject 1,000 mcg into the muscle every 30 (thirty) days.  12/28/13   [provider]  DULoxetine (CYMBALTA) 30 MG capsule Take 30-60 mg by mouth See admin instructions. Take 60 mg in the morning and 30 mg in the evening    [provider]  gabapentin (NEURONTIN) 100 MG capsule Take 2 capsules (200 mg total) by mouth daily. Patient taking differently: Take 800 mg by mouth 3 (three) times daily.  01/09/20 03/03/20  Elodia Florence., MD  hydrOXYzine (ATARAX/VISTARIL) 10 MG tablet Take 1 tablet (10 mg total) by mouth 3 (three) times daily as needed for itching or anxiety. Patient taking differently: Take 10 mg by mouth 3 (three) times daily as needed for anxiety. 10/26/19   Cristal Ford, DO  Multiple Vitamin (MULTIVITAMIN  WITH MINERALS) TABS tablet Take 1 tablet by mouth daily. 10/27/19   Cristal Ford, DO  oxyCODONE-acetaminophen (PERCOCET/ROXICET) 5-325 MG tablet Take by mouth every 4 (four) hours as needed for severe pain.    [provider]  pantoprazole (PROTONIX) 20 MG tablet Take 20 mg by mouth 2 (two) times daily.    [provider]  peg 3350 powder (MOVIPREP) 100 g SOLR Take 1 kit (200 g total) by mouth as directed. 03/03/20   Gatha Mayer, MD  prazosin (MINIPRESS) 1 MG capsule Take 1 mg by mouth at bedtime.    [provider]  sevelamer carbonate (RENVELA) 800 MG tablet Take 800 mg by mouth 3 (three) times daily with meals.    [provider]  sodium bicarbonate 650 MG tablet Take 650 mg by mouth 2 (two) times daily.    [provider]  sodium chloride flush 0.9 % SOLN injection Inject 3 mLs into the vein daily.    [provider]  tamsulosin (FLOMAX) 0.4 MG CAPS capsule Take 0.4 mg by mouth at bedtime.     [provider]     Vital Signs: BP (!) 107/57 (BP Location: Left Arm)   Pulse (!) 58   Temp 98.1 F (36.7 C) (Oral)   Resp 20   Ht 5' 6" (1.676 m)   Wt 128 lb 8.5 oz (58.3 kg)   SpO2 99%   BMI 20.74 kg/m   Physical Exam Vitals and nursing note reviewed.  Constitutional:      General: He is not in acute distress. Pulmonary:     Effort: Pulmonary effort is normal. No respiratory distress.  Abdominal:  Comments: Left TG drain site without erythema, drainage, or active bleeding; approximately 10 cc dark brown fluid in suction bulb.   Genitourinary:    Comments: Bilateral PCN sites without erythema, drainage, or active bleeding; Right PCN with approximately 300 cc clear gold urine in gravity bag; Left PCN with approximately 400 cc clear gold urine in gravity bag. Skin:    General: Skin is warm and dry.  Neurological:     Mental Status: He is alert.     Imaging: IR Fluoro Guide CV Line Right  Result Date:  04/01/2020 CLINICAL DATA:  Renal failure and need for emergent hemodialysis. EXAM: NON-TUNNELED CENTRAL VENOUS HEMODIALYSIS CATHETER PLACEMENT WITH ULTRASOUND AND FLUOROSCOPIC GUIDANCE FLUOROSCOPY TIME:  30 seconds. PROCEDURE: The procedure, risks, benefits, and alternatives were explained to the patient's son. Questions regarding the procedure were encouraged and answered. The patient's son understands and consents to the procedure. A time-out was performed prior to initiating the procedure. Ultrasound was used to confirm patency of the right internal jugular vein. The right neck and chest were prepped with chlorhexidine in a sterile fashion, and a sterile drape was applied covering the operative field. Maximum barrier sterile technique with sterile gowns and gloves were used for the procedure. Local anesthesia was provided with 1% lidocaine. After creating a small venotomy incision, a 21 gauge needle was advanced into the right internal jugular vein under direct, real-time ultrasound guidance. Ultrasound image documentation was performed. After securing guidewire access venous access was dilated over a guidewire. A 16 cm, 13 French triple-lumen Mahurkar non tunneled hemodialysis catheter was advanced over the wire. Final catheter positioning was confirmed and documented with a fluoroscopic spot image. The catheter was aspirated, flushed with saline, and injected with appropriate volume heparin dwells. The catheter exit site was secured with 0-Prolene retention sutures. COMPLICATIONS: None.  No pneumothorax. FINDINGS: After catheter placement, the tip lies at the cavoatrial junction. The catheter aspirates normally and is ready for immediate use. IMPRESSION: Placement of non-tunneled central venous hemodialysis catheter via the right internal jugular vein. The catheter tip lies at the cavoatrial junction. The catheter is ready for immediate use. Electronically Signed   By: Aletta Edouard M.D.   On: 04/01/2020 14:26    IR US Guide Vasc Access Right  Result Date: 04/01/2020 CLINICAL DATA:  Renal failure and need for emergent hemodialysis. EXAM: NON-TUNNELED CENTRAL VENOUS HEMODIALYSIS CATHETER PLACEMENT WITH ULTRASOUND AND FLUOROSCOPIC GUIDANCE FLUOROSCOPY TIME:  30 seconds. PROCEDURE: The procedure, risks, benefits, and alternatives were explained to the patient's son. Questions regarding the procedure were encouraged and answered. The patient's son understands and consents to the procedure. A time-out was performed prior to initiating the procedure. Ultrasound was used to confirm patency of the right internal jugular vein. The right neck and chest were prepped with chlorhexidine in a sterile fashion, and a sterile drape was applied covering the operative field. Maximum barrier sterile technique with sterile gowns and gloves were used for the procedure. Local anesthesia was provided with 1% lidocaine. After creating a small venotomy incision, a 21 gauge needle was advanced into the right internal jugular vein under direct, real-time ultrasound guidance. Ultrasound image documentation was performed. After securing guidewire access venous access was dilated over a guidewire. A 16 cm, 13 French triple-lumen Mahurkar non tunneled hemodialysis catheter was advanced over the wire. Final catheter positioning was confirmed and documented with a fluoroscopic spot image. The catheter was aspirated, flushed with saline, and injected with appropriate volume heparin dwells. The catheter exit site was  secured with 0-Prolene retention sutures. COMPLICATIONS: None.  No pneumothorax. FINDINGS: After catheter placement, the tip lies at the cavoatrial junction. The catheter aspirates normally and is ready for immediate use. IMPRESSION: Placement of non-tunneled central venous hemodialysis catheter via the right internal jugular vein. The catheter tip lies at the cavoatrial junction. The catheter is ready for immediate use. Electronically Signed    By: Aletta Edouard M.D.   On: 04/01/2020 14:26   DG CHEST PORT 1 VIEW  Result Date: 04/02/2020 CLINICAL DATA:  Evaluate for pulmonary pathology. EXAM: PORTABLE CHEST 1 VIEW COMPARISON:  April 01, 2020 11:40 p.m. FINDINGS: The mediastinal contour and cardiac silhouette are stable. Right central venous line is identified with distal tip in the superior vena cava/right atrium. Consolidation of medial right lung base is identified. The left lung is clear. The bony structures are stable. IMPRESSION: Right lung base pneumonia. Electronically Signed   By: Abelardo Diesel M.D.   On: 04/02/2020 08:21   DG Chest Port 1 View  Result Date: 04/01/2020 CLINICAL DATA:  Acute respiratory distress EXAM: PORTABLE CHEST 1 VIEW COMPARISON:  03/31/2020 FINDINGS: Elevation of the right hemidiaphragm again noted with associated right basilar atelectasis. Left lung clear. No pneumothorax or pleural effusion. Right internal jugular central venous catheter tip noted at the superior cavoatrial junction. Cardiac size within normal limits. Pulmonary vascularity is normal. No acute bone abnormality. IMPRESSION: No active disease. Electronically Signed   By: Fidela Salisbury MD   On: 04/01/2020 23:48   DG Chest Port 1 View  Result Date: 03/31/2020 CLINICAL DATA:  Altered mental status, questionable sepsis, pending COVID test. EXAM: PORTABLE CHEST 1 VIEW COMPARISON:  Chest x-ray dated 11/08/2019. FINDINGS: Heart size and mediastinal contours are within normal limits. Lungs appear clear. No pleural effusion or pneumothorax is seen. Osseous structures about the chest are unremarkable. IMPRESSION: No active disease. No evidence of pneumonia. Electronically Signed   By: Franki Cabot M.D.   On: 03/31/2020 14:36   CT Renal Stone Study  Result Date: 03/31/2020 CLINICAL DATA:  Flank pain EXAM: CT ABDOMEN AND PELVIS WITHOUT CONTRAST TECHNIQUE: Multidetector CT imaging of the abdomen and pelvis was performed following the standard protocol  without IV contrast. COMPARISON:  12/30/2019, 01/26/2020, 11/14/2019, 12/24/2019 FINDINGS: Lower chest: Lung bases demonstrate no acute consolidation or pleural effusion. Normal cardiac size. Hepatobiliary: Status post cholecystectomy. No focal hepatic abnormality. No biliary dilatation. Pancreas: Unremarkable. No pancreatic ductal dilatation or surrounding inflammatory changes. Spleen: Normal in size without focal abnormality. Adrenals/Urinary Tract: Adrenal glands within normal limits. Bilateral percutaneous nephrostomy catheters has been removed. Linear soft tissue tract is visible posteriorly. Bilateral ureteral stents are visualized, proximal pigtail on the right is within extrarenal pelvis, distal pigtail within the posterior bladder. Proximal pigtail of left ureteral stent is visible in the proximal left ureter, the distal portion of the stent is in the urinary bladder. Moderate bilateral hydronephrosis which is increased compared to the previous CT. Multiple small foci of gas within the bilateral renal collecting systems which may be due to refluxed air from the bladder versus recent removal of percutaneous nephrostomy tubes. Moderate perinephric fat stranding. Foley catheter within the bladder. Small air within the urinary bladder which appears slightly thick-walled. Stomach/Bowel: The stomach is nonenlarged. Evidence of prior gastric bypass. No dilated small bowel. Large amount of stool in the colon. Negative appendix. No acute bowel wall thickening. Vascular/Lymphatic: Mild aortic atherosclerosis without aneurysm. No suspicious nodes. Reproductive: Slightly enlarged prostate Other: No free air or free fluid. Left  trans gluteal percutaneous drainage catheter with pigtail visible in the left posterior pelvis. No significant fluid collections visible within the pelvis at this time. Musculoskeletal: No acute or suspicious osseous abnormality. IMPRESSION: 1. Bilateral percutaneous nephrostomy catheters have  been removed. Interim finding of moderate bilateral hydronephrosis of extrarenal pelvises, increased compared to prior CT. Right ureteral stent in expected position. Proximal pigtail of the left ureteral stent is visible within the proximal left ureter. Foley catheter present within decompressed urinary bladder; bladder wall appears slightly thickened. 2. Left trans gluteal percutaneous drainage catheter with pigtail visible in the left posterior pelvis. No significant fluid collections visible within the pelvis at this time. 3. Large amount of stool in the colon, suggesting constipation. Aortic Atherosclerosis (ICD10-I70.0). Electronically Signed   By: Donavan Foil M.D.   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 Status post prior left ureteral stent placement in September and right ureteral stent placement in October. Admitted with severe acute kidney injury superimposed on chronic kidney disease, lack of urine output and evidence of bilateral hydronephrosis by imaging. An indwelling right nephrostomy tube has been pulled out for unknown length of time. He now presents for new bilateral percutaneous nephrostomy tube placement for renal decompression to determine degree of recovery will renal function. EXAM: 1. LEFT PERCUTANEOUS NEPHROSTOMY TUBE PLACEMENT 2. RIGHT PERCUTANEOUS NEPHROSTOMY TUBE PLACEMENT COMPARISON:  CT of the abdomen and pelvis on 03/31/2020 ANESTHESIA/SEDATION: Moderate (conscious) sedation was employed during this procedure. A total of Versed 1.0 mg and Fentanyl 50 mcg was administered intravenously. Moderate Sedation Time: 27 minutes. The patient's level of consciousness and vital signs were monitored continuously by radiology nursing throughout the procedure under my direct  supervision. CONTRAST:  15 mL Omnipaque 300 MEDICATIONS: 1 g IV Rocephin. Antibiotic was administered in an appropriate time frame prior to skin puncture. FLUOROSCOPY TIME:  1 minute and 54 seconds.  8.0 mGy. PROCEDURE: The procedure, risks, benefits, and alternatives were explained to the patient's son by telephone. Questions regarding the procedure were encouraged and answered. The patient's son understands and consents to the procedure. A time-out was performed prior to initiating the procedure. Bilateral flank regions were prepped with chlorhexidine in a sterile fashion, and a sterile drape was applied covering the operative field. A sterile gown and sterile gloves were used for the procedure. Local anesthesia was provided with 1% Lidocaine. Ultrasound was used to localize the left kidney. Under direct ultrasound guidance, a 21 gauge needle was advanced into the renal collecting system. Ultrasound image documentation was performed. Aspiration of urine sample was performed followed by contrast injection under fluoroscopy. A transitional dilator was advanced over a guidewire. Percutaneous tract dilatation was then performed over the guidewire. A 10-French percutaneous nephrostomy tube was then advanced and formed in the left renal collecting system. Additional urine sample was aspirated and sent for culture analysis. Catheter position was confirmed by fluoroscopy after contrast injection. Ultrasound was then used to localize the right kidney. A 21 gauge needle was advanced under ultrasound guidance into the right renal collecting system and ultrasound image documentation performed. After aspiration of urine, contrast was injected under fluoroscopy. A transitional dilator was advanced over a guidewire under fluoroscopy. The tract was dilated and a 10 French percutaneous nephrostomy tube advanced and formed in the right renal collecting system. Catheter position was confirmed by fluoroscopy after contrast injection.  Both nephrostomy tubes were secured at the skin with Prolene retention sutures and Stat-Lock devices. Both were connected to gravity drainage bags. COMPLICATIONS: None. FINDINGS: Ultrasound confirms the presence of significant hydronephrosis bilaterally. After access of the left renal collecting system and placement of a nephrostomy tube formed in the renal pelvis, there is return of grossly purulent appearing urine. A sample of purulent fluid was sent for culture analysis. After placement of the right nephrostomy tube which was also formed in the renal pelvis, there is return of some blood tinged urine. IMPRESSION: Placement of bilateral 10 French percutaneous nephrostomy tubes formed at the level of the renal pelvis. Urine from the left kidney was grossly purulent and a sample sent for culture analysis. Both nephrostomy tubes were connected to gravity drainage bags. Electronically Signed   By: Aletta Edouard M.D.   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 Status post prior left ureteral stent placement in September and right ureteral stent placement in October. Admitted with severe acute kidney injury superimposed on chronic kidney disease, lack of urine output and evidence of bilateral hydronephrosis by imaging. An indwelling right nephrostomy tube has been pulled out for unknown length of time. He now presents for new bilateral percutaneous nephrostomy tube placement for renal decompression to determine degree of recovery will renal function. EXAM: 1. LEFT PERCUTANEOUS NEPHROSTOMY TUBE PLACEMENT 2. RIGHT PERCUTANEOUS NEPHROSTOMY TUBE PLACEMENT COMPARISON:  CT of the abdomen and pelvis on 03/31/2020 ANESTHESIA/SEDATION: Moderate (conscious) sedation was employed during this procedure. A total of  Versed 1.0 mg and Fentanyl 50 mcg was administered intravenously. Moderate Sedation Time: 27 minutes. The patient's level of consciousness and vital signs were monitored continuously by radiology nursing throughout the procedure under my direct supervision. CONTRAST:  15 mL Omnipaque 300 MEDICATIONS: 1 g IV Rocephin. Antibiotic was administered in an appropriate time frame prior to skin puncture. FLUOROSCOPY TIME:  1 minute and 54 seconds.  8.0 mGy. PROCEDURE: The procedure, risks, benefits, and alternatives were explained to the patient's son by telephone. Questions regarding the procedure were encouraged and answered. The patient's son understands and consents to the procedure. A time-out was performed prior to initiating the procedure. Bilateral flank regions were prepped with chlorhexidine in a sterile fashion, and a sterile drape was applied covering the operative field. A sterile gown and sterile gloves were used for the procedure. Local anesthesia was provided with 1% Lidocaine. Ultrasound was used to localize the left kidney. Under direct ultrasound guidance, a 21 gauge needle was advanced into the renal collecting system. Ultrasound image documentation was performed. Aspiration of urine sample was performed followed by contrast injection under fluoroscopy. A transitional dilator was advanced over a guidewire. Percutaneous tract dilatation was then performed over the guidewire. A 10-French percutaneous nephrostomy tube was then advanced and formed in the left renal collecting system. Additional urine sample was aspirated and sent for culture analysis. Catheter position was confirmed by fluoroscopy after contrast injection. Ultrasound was then used to localize the right kidney. A 21 gauge needle was advanced under ultrasound guidance into the right renal collecting system and ultrasound image documentation performed. After aspiration of urine, contrast was injected under fluoroscopy. A transitional dilator  was advanced over a guidewire under fluoroscopy. The tract was dilated and a 10 French percutaneous nephrostomy tube advanced and formed in the right renal collecting system.  Catheter position was confirmed by fluoroscopy after contrast injection. Both nephrostomy tubes were secured at the skin with Prolene retention sutures and Stat-Lock devices. Both were connected to gravity drainage bags. COMPLICATIONS: None. FINDINGS: Ultrasound confirms the presence of significant hydronephrosis bilaterally. After access of the left renal collecting system and placement of a nephrostomy tube formed in the renal pelvis, there is return of grossly purulent appearing urine. A sample of purulent fluid was sent for culture analysis. After placement of the right nephrostomy tube which was also formed in the renal pelvis, there is return of some blood tinged urine. IMPRESSION: Placement of bilateral 10 French percutaneous nephrostomy tubes formed at the level of the renal pelvis. Urine from the left kidney was grossly purulent and a sample sent for culture analysis. Both nephrostomy tubes were connected to gravity drainage bags. Electronically Signed   By: Aletta Edouard M.D.   On: 04/01/2020 14:20    Labs:  CBC: Recent Labs    04/01/20 0213 04/02/20 0312 04/02/20 1533 04/03/20 0401 04/04/20 0447  WBC 13.4* 12.5*  --  12.5* 10.3  HGB 7.0* 6.3* 7.9* 7.1* 7.0*  HCT 19.8* 18.1* 22.2* 20.4* 20.7*  PLT 215 220  --  180 159    COAGS: Recent Labs    10/17/19 1241 10/17/19 1311 11/07/19 1836 11/07/19 1853 11/10/19 1052 12/08/19 1258 12/27/19 0450 01/26/20 1050 03/31/20 1420  INR 1.5* 1.6*   < >  --    < > 1.1 1.3* 1.2 1.5*  APTT 43* 46*  --  31  --   --   --   --  37*   < > = values in this interval not displayed.    BMP: Recent Labs    01/01/20 0430 01/02/20 0427 01/03/20 0506 01/04/20 0453 01/05/20 0558 04/01/20 0841 04/02/20 0312 04/03/20 0401 04/04/20 0447  NA 143 144 143 143   < > 145 142  143 136  K 3.5 3.9 3.9 3.9   < > 3.0* 3.0* 3.0* 3.4*  CL 116* 113* 112* 112*   < > 106 102 104 101  CO2 18* 19* 22 23   < > 9* 18* 17* 23  GLUCOSE 73 84 81 77   < > 76 75 86 98  BUN 60* 61* 53* 47*   < > 133* 59* 69* 28*  CALCIUM 7.3* 7.5* 7.5* 7.4*   < > 6.6* 6.8* 7.3* 7.0*  CREATININE 6.54* 6.27* 6.42* 5.74*   < > 15.34* 7.58* 8.39* 4.22*  GFRNONAA 9* 9* 9* 10*   < > 3* 8* 7* 16*  GFRAA 10* 11* 10* 12*  --   --   --   --   --    < > = values in this interval not displayed.    LIVER FUNCTION TESTS: Recent Labs    12/28/19 0503 12/30/19 0603 01/06/20 0507 01/07/20 0449 01/08/20 0518 03/31/20 1420  BILITOT 0.3  --   --  0.3 0.7 0.9  AST 28  --   --  37 26 38  ALT 8  --   --  _0 ALKPHOS 87  --   --  86 81 146*  PROT 6.0*  --   --  5.7* 5.6* 8.0  ALBUMIN 2.2*   < > 1.9* 2.0*  2.0* 2.0* 3.4*   < > = values in this interval not displayed.    Assessment and Plan:  Urinary obstruction secondary to pelvic mass with subsequent bilateral hydronephrosisin setting of  bilateral ureteral stents, with bilateral PCNs inadvertently removed (unknown how long they have not been in place) s/p bilateral PCN placements in IR 04/01/2020. Right PCN stable with approximately 300 cc clear gold urine in gravity bag (additional 300 cc output from drain in past 24 hours per chart). Left PCN stable with approximately 400 cc clear gold urine in gravity bag (additional 375 cc output from drain in past 24 hours per chart). Continue current PCN management- continue with Qshift flushes/monitor of output; further PCN management per urology.  Pelvic abscesswith evidence of colovesical fistulas/p left TG drain placement in IR 11/15/2019. Left TG drain stable with approximately 10 cc dark brown fluid in suction bulb. Continue current drain management- continue with QD flushes and Qshift monitor of output. Given evidence of colovesical fistula, drain will likely remain in place until time of definitive  surgery.  Further plans per [facility]/CCM/urology/nephrology- appreciate and agree with management. IR to follow.   Electronically Signed: Earley Abide, PA-C 04/04/2020, 10:45 AM   I spent a total of 25 Minutes at the the patient's bedside AND on the patient's hospital floor or unit, greater than 50% of which was counseling/coordinating care for urinary obstruction s/p bilateral PCN placements AND pelvic abscess with fistula s/p left TG drain placement.

## 2020-04-04 NOTE — Progress Notes (Signed)
[facility] Progress Note    Assessment/ Plan:   Assessment/Recommendations: Chase MISKO Sr. is a/an 55 y.o. male with a past medical history significant for gastric bypass, hodgkin's lymphoma, colovesical fistula, bilateral ureteral stenosis w/ ckd and hydronephrosiswho present w/ acute renal failure, sepsis, and hydronephrosis  Non-OliguricAKI on CKD V 2/2 urinary obstruction and dehydration w/ severe electrolyte derrangements.CKD secondary to longstanding obstruction. AKI likely dehydration with obstruction contributing also with sepsis likely leading to ATN. Severe electrolyte derangements with severe acidosis and likely uremia.  - HD #1 1/121, HD #2 04/04/19 --> he is making fairly good UOP  -Continue with IV hydration with LR -Appreciate IR help with temporary HD catheter and nephrostomy tubes - he's better per notes but still not able to engage in a whole lot of discussion today (at least for me) - appreciate palliative care - he appears quite frail and therefore is a suboptimal dialysis candidate - will check labs tomorrow AM and decide if he needs further dialysis  Bilateral hydronephrosis: History of such presenting with removal of his nephrostomy tubes and hydronephrosis. IR consulted placed bilateral PCN, appreciate help.    Sepsis: Likely urosepsis. Blood cultures no growth for 48 hours.   Urine cultures with multiple organisms suggestive recollection but likely unnecessary, this is likely multimicrobial given the urine cultures from each nephrostomy tube and urine grew the same thing.  Continue antibiotics per primary team  Severe metabolic acidosis: pH 1.443 now significantly improved with IV bicarbonate and dialysis.  Hypokalemia: replete prn  Anemia: Multifactorial with hemoglobin less than 7 today.  Transfusion per primary team.  Consider ESA in the future  Hyperphosphatemia: Phosphorus checked and greater than 30. Dialysis and PCN  placement as above.   Dispo: weak and not eating- he has a FTT picture on top of all this- I think his prognosis isn't good even if he is able to come off dialysis and I appreciate pall care   Subjective:    Seen in room.  On the phone with his two sons, Chase [name] and Chase [name] re: [place], facilitated by palliative.     Objective:   BP 120/72 (BP Location: Left Arm)   Pulse (!) 49   Temp 98 F (36.7 C) (Oral)   Resp 19   Ht 5\' 6"  (1.676 m)   Wt 58.3 kg   SpO2 99%   BMI 20.74 kg/m   Intake/Output Summary (Last 24 hours) at 04/04/2020 1312 Last data filed at 04/04/2020 1218 Gross per 24 hour  Intake 4807.36 ml  Output 1275 ml  Net 3532.36 ml   Weight change: 0.5 kg  Physical Exam: Gen: NAD, lying curled up in the bed, frail and pale CVS: RRR Resp: clear Abd: soft GU: bilateral PCN tubes in place, draining yellow urine Ext: no LE edema ACCESS: R IJ nontunneled HD cath  Imaging: No results found.  Labs: BMET Recent Labs  Lab 03/31/20 1420 04/01/20 0213 04/01/20 0841 04/02/20 0312 04/03/20 0401 04/04/20 0447  NA 133* 141 145 142 143 136  K 5.1 4.2 3.0* 3.0* 3.0* 3.4*  CL 108 108 106 102 104 101  CO2 <7* <7* 9* 18* 17* 23  GLUCOSE 126* 88 76 75 86 98  BUN 136* 134* 133* 59* 69* 28*  CREATININE 16.69* 15.49* 15.34* 7.58* 8.39* 4.22*  CALCIUM 7.9* 7.3* 6.6* 6.8* 7.3* 7.0*  PHOS  --  >30.0*  --  4.2  --   --    CBC Recent Labs  Lab 03/31/20 1420  04/01/20 0213 04/02/20 0312 04/02/20 1533 04/03/20 0401 04/04/20 0447  WBC 16.6* 13.4* 12.5*  --  12.5* 10.3  NEUTROABS 15.1*  --   --   --   --   --   HGB 8.3* 7.0* 6.3* 7.9* 7.1* 7.0*  HCT 24.7* 19.8* 18.1* 22.2* 20.4* 20.7*  MCV 92.2 88.8 85.4  --  85.7 87.7  PLT 262 215 220  --  180 159    Medications:    . Chlorhexidine Gluconate Cloth  6 each Topical Daily  . DULoxetine  60 mg Oral Daily   And  . DULoxetine  30 mg Oral QHS  . heparin  5,000 Units Subcutaneous Q8H  . pantoprazole  40 mg Oral QHS  .  sevelamer carbonate  800 mg Oral TID WC  . sodium bicarbonate  650 mg Oral BID  . sodium chloride flush  5 mL Intracatheter Q8H  . tamsulosin  0.4 mg Oral QHS      Madelon Lips, MD 04/04/2020, 1:12 PM

## 2020-04-04 NOTE — Progress Notes (Signed)
Patient ID: Chase [name]., male   DOB: Aug 01, 1965, 55 y.o.   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  Found to have creatinine of 16. Also with severe urosepsis.  CT scan showed bilateral hydronephrosis with a left transgluteal drainage without fluid collection and a large colonic stool burden. Bilateral percutaneous nephrostomy tubes placed by IR 1/1. Plans for short term HD vs CRRT. Poor candidate for outpatient dialysis. PMT consulted to discuss Chase [name].   Today patient is totally different from yesterday.  He is alert and oriented x3 he is participating in today's conversation appropriately, he has capacity to make his own decisions at this time.  Patient and family face treatment option decisions, advanced directive decisions and anticipatory care needs.   Today's Discussion :  Spoke to son/Chase [name] and Chase [name]/son by telephone at the bedside for scheduled family meeting  Education regarding current medical situation; diagnosis, prognosis, GOCs, EOL wishes, disposition and anticipatory care needs.  All vebalizes an understanding of the seriousness of the patient's medical situation.    Created space and opportunity for exploration of thoughts and feelings regarding current medical situation.   Patient speaks to " I am young and not ready to throw in the towel"   He is open to all offered and available medical interventions to prolong quality of life.  Emotional support  offered  Education offered regarding ESRD and long term dialysis.  Discussed trajectory and associated care needs.   His sons support this decision but also are insightful to the reality of his care needs and limited support available to him.  Although patient ultimately desires to return home, family is concerned for safety and the possibility of this   The difference between an aggressive medical intervention path and a more palliative comfort path for this patient at this time in this situation detailed.   Raised awareness to importance of ACP decisions and next steps in treatment plan.  Recommendation for DNR/DNI: Encouraged patient/family to consider DNR/DNI status understanding evidenced based poor outcomes in similar hospitalized patient, as the cause of arrest is likely associated with advanced chronic illness rather than an easily reversible acute cardio-pulmonary event.  Patient remains Full Code  Discussed the importance of continued conversation with each other and the medical providers regarding overall plan of care and treatment options, ensuring decisions are within the context of the patients values and GOCs.  Questions and concerns addressed     Vital Signs Vitals:   04/04/20 0500 04/04/20 0728  BP:  (!) 107/57  Pulse: (!) 58   Resp:  20  Temp:  98.1 F (36.7 C)  SpO2: 99%     Intake/Output Summary (Last 24 hours) at 04/04/2020 1043 Last data filed at 04/04/2020 8563 Gross per 24 hour  Intake 4507.36 ml  Output 1275 ml  Net 3232.36 ml   Last Weight  Most recent update: 04/04/2020  5:21 AM   Weight  58.3 kg (128 lb 8.5 oz)            Recommendations and Plan:  -Full code -open  to all offered and available medical interventions to prolong life -"take it one day at a time"  -open to SNF for rehab if he can return to [place] will continue to support holistically   Discussed with Dr. Venetia Constable via secure chat  and Dr Hollie Salk  Time In: 1100 Time Out:  1200 Time Spent: 60 minutes Greater than 50% of the time was spent in counseling and coordination of care ______________________________________________________________________________________ Wadie Lessen NP  [facility] Team Team Cell Phone: [phone] Please utilize secure chat with additional questions, if there is no response within 30 minutes please call the above phone number  Palliative Medicine Team providers are available by phone from 7am to 7pm daily and can be reached through the team cell phone.  Should this patient require assistance outside of these hours, please call the patient's attending physician.

## 2020-04-05 DIAGNOSIS — N179 Acute kidney failure, unspecified: Secondary | ICD-10-CM | POA: Diagnosis not present

## 2020-04-05 DIAGNOSIS — N170 Acute kidney failure with tubular necrosis: Secondary | ICD-10-CM | POA: Diagnosis not present

## 2020-04-05 DIAGNOSIS — N185 Chronic kidney disease, stage 5: Secondary | ICD-10-CM | POA: Diagnosis not present

## 2020-04-05 LAB — GLUCOSE, CAPILLARY
Glucose-Capillary: 102 mg/dL — ABNORMAL HIGH (ref 70–99)
Glucose-Capillary: 85 mg/dL (ref 70–99)
Glucose-Capillary: 100 mg/dL — ABNORMAL HIGH (ref 70–99)
Glucose-Capillary: 91 mg/dL (ref 70–99)

## 2020-04-05 LAB — CULTURE, BLOOD (ROUTINE X 2)
Culture: NO GROWTH
Culture: NO GROWTH
Special Requests: ADEQUATE
Special Requests: ADEQUATE

## 2020-04-05 LAB — CBC
HCT: 24 % — ABNORMAL LOW (ref 39.0–52.0)
Hemoglobin: 8.2 g/dL — ABNORMAL LOW (ref 13.0–17.0)
MCH: 30.3 pg (ref 26.0–34.0)
MCHC: 34.2 g/dL (ref 30.0–36.0)
MCV: 88.6 fL (ref 80.0–100.0)
Platelets: 175 10*3/uL (ref 150–400)
RBC: 2.71 MIL/uL — ABNORMAL LOW (ref 4.22–5.81)
RDW: 15.8 % — ABNORMAL HIGH (ref 11.5–15.5)
WBC: 9.1 10*3/uL (ref 4.0–10.5)
nRBC: 0 % (ref 0.0–0.2)

## 2020-04-05 LAB — BPAM RBC
Blood Product Expiration Date: 202202082359
ISSUE DATE / TIME: 202201041320
Unit Type and Rh: 5100

## 2020-04-05 LAB — TYPE AND SCREEN
ABO/RH(D): O POS
Antibody Screen: NEGATIVE
Unit division: 0

## 2020-04-05 LAB — BASIC METABOLIC PANEL
Anion gap: 9 (ref 5–15)
BUN: 33 mg/dL — ABNORMAL HIGH (ref 6–20)
CO2: 23 mmol/L (ref 22–32)
Calcium: 7 mg/dL — ABNORMAL LOW (ref 8.9–10.3)
Chloride: 102 mmol/L (ref 98–111)
Creatinine, Ser: 4.43 mg/dL — ABNORMAL HIGH (ref 0.61–1.24)
GFR, Estimated: 15 mL/min — ABNORMAL LOW (ref >60)
Glucose, Bld: 89 mg/dL (ref 70–99)
Potassium: 3.9 mmol/L (ref 3.5–5.1)
Sodium: 134 mmol/L — ABNORMAL LOW (ref 135–145)

## 2020-04-05 MED ORDER — ADULT MULTIVITAMIN W/MINERALS CH
1.0000 | ORAL_TABLET | Freq: Two times a day (BID) | ORAL | Status: DC
  Administered 2020-04-05 – 2020-05-03 (×53): 1 via ORAL
  Filled 2020-04-05 (×54): qty 1

## 2020-04-05 MED ORDER — ADULT MULTIVITAMIN W/MINERALS CH: 1.0000 | ORAL_TABLET | Freq: Every day | ORAL | Status: DC

## 2020-04-05 MED ORDER — CALCIUM CARBONATE ANTACID 500 MG PO CHEW
1.0000 | CHEWABLE_TABLET | Freq: Three times a day (TID) | ORAL | Status: DC
  Administered 2020-04-05 – 2020-05-03 (×61): 200 mg via ORAL
  Filled 2020-04-05 (×70): qty 1

## 2020-04-05 NOTE — Progress Notes (Addendum)
PROGRESS NOTE   Chase [name]  ZOX:[id number]    DOB: 1965-06-14    DOA: 03/31/2020  PCP: [name]   I have briefly reviewed patients previous medical records in [facility].  Chief Complaint  Patient presents with  . Altered Mental Status    Brief Narrative:  55 year old male with PMH of Hodgkin's lymphoma s/p chemotherapy 2011-2012, gastric bypass in 8119 complicated by anastomotic ulcer in 2014, colovesical fistula with abscess s/p PC drain, stage IV CKD, bilateral ureteral stenosis with ureteral stents and bilateral percutaneous nephrostomy tubes, history of CVA in 2021, presented to the ED obtunded and his nephrostomy tubes out.  Creatinine was 16.69 (in October 2021: Creatinine 5.76).  He was admitted for acute renal failure complicating stage V CKD secondary to urinary obstruction and dehydration with severe electrolyte derangements.  Urology was consulted.  Patient underwent hemodialysis.  Improving.   Assessment & Plan:  Principal Problem:   Sepsis with acute renal failure without septic shock (HCC) Active Problems:   Chronic pain   Colovesical fistula   Acute renal failure superimposed on stage 5 chronic kidney disease, not on chronic dialysis ([place])   History of gastric bypass   Hodgkin lymphoma, unspecified, unspecified site (HCC)   Metabolic acidosis   Toxic metabolic encephalopathy   PTSD (post-traumatic stress disorder)   Bilateral ureteral obstruction s/p perc nephrosotmy & stenting   History of CVA (cerebrovascular accident)   DNR (do not resuscitate) discussion   Palliative care by specialist   Adult failure to thrive   Acute renal failure complicating stage V chronic kidney disease:  Nephrology consulted and assisted with evaluation and management.  Creatinine 5.76 in October 2021.  Presented with creatinine of 16.69.  CKD likely secondary to longstanding obstruction.  AKI due to dehydration, sepsis leading to ATN on the backdrop  of obstruction.  Had severe electrolyte derangements with severe acidosis and likely uremia on presentation.  Treating with IV fluids.  Bilateral percutaneous nephrostomy tubes were replaced by IR on 04/01/2020.  Temporary HD catheter placed by IR and patient underwent 2 cycles of hemodialysis thus far.  As per nephrology follow-up today, it appears that his creatinine is plateauing and hence plan to hold off dialysis today and follow creatinine closely.  Not a good long-term dialysis candidate.  Bilateral hydronephrosis  Patient does not recollect that his nephrostomy tubes were out on admission.  He insists that they were not out.  IR was consulted and placed back bilateral PCNs.  Severe metabolic acidosis:  Bicarbonate <7 on admission.  Lactate was normal.  Due to acute renal failure  Resolved.  Remains on sodium bicarbonate, discontinue?  Hypokalemia  Replaced.  Follow BMP.  Hyperphosphatemia  Was >30.  Resolved.  Is on Renvela.  Severe sepsis secondary to UTI, POA  Encephalopathy can hypothermic on admission with leukocytosis.  Multiple urine culture samples from 12/31 and 1/1 show multiple species.  Blood cultures x2 from 12/31: Negative, final report.  CT renal stone study from 12/31: Bladder wall appeared slightly thickened.  No significant fluid collection in the pelvis.  As per earlier plans, complete 7 days of IV cefepime (started 12/31)  Sepsis physiology resolved.  Anemia in CKD  S/p 2 units PRBCs  Hemoglobin stable in the low 8 g range for the last 2 days.  Follow CBC.  Acute metabolic/uremic encephalopathy  His encephalopathy was likely multifactorial due to uremia, infection and medications (Cymbalta-resumed, oxycodone and Neurontin).  Mental status has significantly improved but  still appeared slightly confused at times this morning.  Monitor.  History of pelvic abscess 11/15/2019, s/p ATG drain by IR/colovesical fistula  He has a  transgluteal JP drain for his abscess with no significant fluid collection seen in the pelvis on CT on admission.  He is planned for colonoscopy and surgery later in January 2021.  Outpatient follow-up with surgery  Asymptomatic sinus bradycardia  Sinus bradycardia ranging from 38-40s, consistent.  Asymptomatic.  Avoid rate limiting medications  TSH 1.382.  Chronic pain  Judicious and avoid opioids unless absolutely needed.  History of CVA  Ischemic CVA in July 2021, history of A. fib not candidate for anticoagulation due to GI bleed and thrombocytopenia.  No focal deficits.  PTSD  Holding sedatives.  History of gastric bypass  Continue PPI  History of Hodgkin's lymphoma, s/p chemotherapy  Noted.  Outpatient follow-up.  Goals of care  Palliative care medicine consultation 1/4 appreciated: Full code, full scope of treatment and open to SNF for rehab.  Body mass index is 21.6 kg/m.  Nutritional Status Nutrition Problem: Severe Malnutrition Etiology: chronic illness (colovesical fistula) Signs/Symptoms: moderate fat depletion,severe fat depletion,moderate muscle depletion,severe muscle depletion Interventions: MVI,Refer to RD note for recommendations    DVT prophylaxis: heparin injection 5,000 Units Start: 04/01/20 2200     Code Status: Full Code Family Communication: None at bedside. Disposition:  Status is: Inpatient  Remains inpatient appropriate because:Inpatient level of care appropriate due to severity of illness   Dispo: The patient is from: SNF              Anticipated d/c is to: SNF              Anticipated d/c date is: 3 days              Patient currently is not medically stable to d/c.        Consultants:   Nephrology Interventional radiology Palliative care medicine  Procedures:   As noted above  Antimicrobials:    Anti-infectives (From admission, onward)   Start     Dose/Rate Route Frequency Ordered Stop   04/03/20 1600   ceFEPIme (MAXIPIME) 1 g in sodium chloride 0.9 % 100 mL IVPB        1 g 200 mL/hr over 30 Minutes Intravenous Every 24 hours 04/03/20 0918 04/07/20 1559   04/01/20 1600  ceFEPIme (MAXIPIME) 1 g in sodium chloride 0.9 % 100 mL IVPB  Status:  Discontinued        1 g 200 mL/hr over 30 Minutes Intravenous Every 24 hours 03/31/20 1550 04/03/20 0918   04/01/20 1145  cefTRIAXone (ROCEPHIN) 1 g in sodium chloride 0.9 % 100 mL IVPB        1 g 200 mL/hr over 30 Minutes Intravenous  Once 04/01/20 1057 04/01/20 1308   03/31/20 1415  ceFEPIme (MAXIPIME) 2 g in sodium chloride 0.9 % 100 mL IVPB        2 g 200 mL/hr over 30 Minutes Intravenous  Once 03/31/20 1407 03/31/20 1516        Subjective:  Patient seems somewhat irritable.  Eager on going home.  At times appeared somewhat confused.  Insistent that his percutaneous nephrostomies were still in place when he came in although they were not.  Denies complaints.  No pain or dyspnea reported.  Aware that he and his family met with "Mary"-palliative care medicine  Objective:   Vitals:   04/05/20 0400 04/05/20 0500 04/05/20 0800 04/05/20 1155  BP:  116/67  122/68 129/73  Pulse:  (!) 43 (!) 56 (!) 49  Resp: (!) 22 16 17 17   Temp: 98.4 F (36.9 C)  98.3 F (36.8 C) 98.5 F (36.9 C)  TempSrc: Oral  Oral Oral  SpO2:  98% 98% 99%  Weight:      Height:        General exam: Young male, looks older than stated age, moderately built and frail lying comfortably propped up in bed without distress. Respiratory system: Clear to auscultation. Respiratory effort normal. Cardiovascular system: S1 & S2 heard, regular bradycardia. No JVD, murmurs, rubs, gallops or clicks. No pedal edema.  Telemetry personally reviewed: Sinus bradycardia in the high 30s (38)-40s. Gastrointestinal system: Abdomen is nondistended, soft and nontender. No organomegaly or masses felt. Normal bowel sounds heard. GU: Bilateral PCNs draining straw clear urine. Central nervous system:  Alert and oriented x2. No focal neurological deficits. Extremities: Symmetric 5 x 5 power. Skin: Multiple tattoos.  Transgluteal JP drain with bulb showing minimal brown fluid. Psychiatry: Judgement and insight appear somewhat impaired. Mood & affect somewhat irritable    Data Reviewed:   I have personally reviewed following labs and imaging studies   CBC: Recent Labs  Lab 03/31/20 1420 04/01/20 0213 04/04/20 0447 04/04/20 2054 04/05/20 0158  WBC 16.6*   < > 10.3 10.2 9.1  NEUTROABS 15.1*  --   --  8.2*  --   HGB 8.3*   < > 7.0* 8.3* 8.2*  HCT 24.7*   < > 20.7* 23.7* 24.0*  MCV 92.2   < > 87.7 88.8 88.6  PLT 262   < > 159 166 175   < > = values in this interval not displayed.    Basic Metabolic Panel: Recent Labs  Lab 04/01/20 0213 04/01/20 0841 04/02/20 0312 04/03/20 0401 04/04/20 0447 04/05/20 0158  NA 141   < > 142 143 136 134*  K 4.2   < > 3.0* 3.0* 3.4* 3.9  CL 108   < > 102 104 101 102  CO2 <7*   < > 18* 17* 23 23  GLUCOSE 88   < > 75 86 98 89  BUN 134*   < > 59* 69* 28* 33*  CREATININE 15.49*   < > 7.58* 8.39* 4.22* 4.43*  CALCIUM 7.3*   < > 6.8* 7.3* 7.0* 7.0*  MG 2.2  --   --   --   --   --   PHOS >30.0*  --  4.2  --   --   --    < > = values in this interval not displayed.    Liver Function Tests: Recent Labs  Lab 03/31/20 1420  AST 38  ALT 25  ALKPHOS 146*  BILITOT 0.9  PROT 8.0  ALBUMIN 3.4*    CBG: Recent Labs  Lab 04/04/20 0516 04/05/20 0748 04/05/20 1158  GLUCAP 89 102* 25    Microbiology Studies:   Recent Results (from the past 240 hour(s))  Urine culture     Status: Abnormal   Collection Time: 03/31/20  2:07 PM   Specimen: In/Out Cath Urine  Result Value Ref Range Status   Specimen Description   Final    IN/OUT CATH URINE Performed at [facility], [address], [place], [place] [address]    Special Requests   Final    NONE Performed at [facility], [address], [place], [place] [address]    Culture MULTIPLE  SPECIES PRESENT, SUGGEST RECOLLECTION (A)  Final   Report Status 04/02/2020 FINAL  Final  Blood Culture (routine x 2)     Status: None   Collection Time: 03/31/20  2:20 PM   Specimen: BLOOD  Result Value Ref Range Status   Specimen Description BLOOD BLOOD LEFT FOREARM  Final   Special Requests   Final    BOTTLES DRAWN AEROBIC AND ANAEROBIC Blood Culture adequate volume   Culture   Final    NO GROWTH 5 DAYS Performed at [facility], [address] [place], [place] [address]    Report Status 04/05/2020 FINAL  Final  Blood Culture (routine x 2)     Status: None   Collection Time: 03/31/20  2:20 PM   Specimen: BLOOD  Result Value Ref Range Status   Specimen Description BLOOD LEFT ANTECUBITAL  Final   Special Requests   Final    BOTTLES DRAWN AEROBIC AND ANAEROBIC Blood Culture adequate volume   Culture   Final    NO GROWTH 5 DAYS Performed at [facility], [address] [place], [place] [address]    Report Status 04/05/2020 FINAL  Final  Resp Panel by RT-PCR (Flu A&B, Covid) Nasopharyngeal Swab     Status: None   Collection Time: 03/31/20  2:48 PM   Specimen: Nasopharyngeal Swab; Nasopharyngeal(NP) swabs in vial transport medium  Result Value Ref Range Status   SARS Coronavirus 2 by RT PCR NEGATIVE NEGATIVE Final    Comment: (NOTE) SARS-CoV-2 target nucleic acids are NOT DETECTED.  The SARS-CoV-2 RNA is generally detectable in upper respiratory specimens during the acute phase of infection. The lowest concentration of SARS-CoV-2 viral copies this assay can detect is 138 copies/mL. A negative result does not preclude SARS-Cov-2 infection and should not be used as the sole basis for treatment or other patient management decisions. A negative result may occur with  improper specimen collection/handling, submission of specimen other than nasopharyngeal swab, presence of viral mutation(s) within the areas targeted by this assay, and inadequate number of viral copies(<138  copies/mL). A negative result must be combined with clinical observations, patient history, and epidemiological information. The expected result is Negative.  Fact Sheet for Patients:  EntrepreneurPulse.com.au  Fact Sheet for Healthcare Providers:  IncredibleEmployment.be  This test is no t yet approved or cleared by the [place] FDA and  has been authorized for detection and/or diagnosis of SARS-CoV-2 by FDA under an Emergency Use Authorization (EUA). This EUA will remain  in effect (meaning this test can be used) for the duration of the COVID-19 declaration under Section 564(b)(1) of the Act, 21 U.S.C.section 360bbb-3(b)(1), unless the authorization is terminated  or revoked sooner.       Influenza A by PCR NEGATIVE NEGATIVE Final   Influenza B by PCR NEGATIVE NEGATIVE Final    Comment: (NOTE) The Xpert Xpress SARS-CoV-2/FLU/RSV plus assay is intended as an aid in the diagnosis of influenza from Nasopharyngeal swab specimens and should not be used as a sole basis for treatment. Nasal washings and aspirates are unacceptable for Xpert Xpress SARS-CoV-2/FLU/RSV testing.  Fact Sheet for Patients: EntrepreneurPulse.com.au  Fact Sheet for Healthcare Providers: IncredibleEmployment.be  This test is not yet approved or cleared by the [place] FDA and has been authorized for detection and/or diagnosis of SARS-CoV-2 by FDA under an Emergency Use Authorization (EUA). This EUA will remain in effect (meaning this test can be used) for the duration of the COVID-19 declaration under Section 564(b)(1) of the Act, 21 U.S.C. section 360bbb-3(b)(1), unless the authorization is  terminated or revoked.  Performed at [facility], [address], [place], [place] [address]   Urine Culture     Status: Abnormal   Collection Time: 03/31/20 10:27 PM   Specimen: In/Out Cath Urine  Result Value Ref Range Status    Specimen Description IN/OUT CATH URINE  Final   Special Requests   Final    NONE Performed at [facility], [place] [address] [place], [place] [address]    Culture MULTIPLE SPECIES PRESENT, SUGGEST RECOLLECTION (A)  Final   Report Status 04/02/2020 FINAL  Final  Urine culture     Status: Abnormal   Collection Time: 04/01/20  1:57 PM   Specimen: Urine, Random  Result Value Ref Range Status   Specimen Description URINE, RANDOM  Final   Special Requests   Final    LEFT KIDNEY Performed at [facility], [place] [address], [place], [place] [address]    Culture MULTIPLE SPECIES PRESENT, SUGGEST RECOLLECTION (A)  Final   Report Status 04/02/2020 FINAL  Final     Radiology Studies:  No results found.   Scheduled Meds:   . calcium carbonate  1 tablet Oral TID  . Chlorhexidine Gluconate Cloth  6 each Topical Daily  . DULoxetine  60 mg Oral Daily   And  . DULoxetine  30 mg Oral QHS  . heparin  5,000 Units Subcutaneous Q8H  . multivitamin with minerals  1 tablet Oral BID  . pantoprazole  40 mg Oral QHS  . sevelamer carbonate  800 mg Oral TID WC  . sodium bicarbonate  650 mg Oral BID  . sodium chloride flush  5 mL Intracatheter Q8H  . tamsulosin  0.4 mg Oral QHS    Continuous Infusions:   . ceFEPime (MAXIPIME) IV 1 g (04/04/20 1717)  . lactated ringers with kcl 100 mL/hr at 04/05/20 0511     LOS: 5 days     Vernell Leep, MD, [place], [facility]. [facility]    To contact the attending provider between 7A-7P or the covering provider during after hours 7P-7A, please log into the web site [email or web address] and access using universal [place] password for that web site. If you do not have the password, please call the hospital operator.  04/05/2020, 4:09 PM

## 2020-04-05 NOTE — Progress Notes (Signed)
Per Dr. Hollie Salk will hold on HD for today pt. Having signs of renal recovery.

## 2020-04-05 NOTE — Progress Notes (Signed)
Pt's pain is managed with Norco. His appetite is inadequate but oral intake is fair. Barrier cream and a mepliex was applied to the Pt's bottom. Pt's nephro tubes are patent and draining without any problems. The initial output from the left side of the nephro tube was serosanguinous, and the last output was more bloody. Pt did refuse his CHG bath and some of medications as well. Pt's needs are met and safety measures are maintained.

## 2020-04-05 NOTE — Progress Notes (Signed)
   04/04/20 2000  Assess: MEWS Score  BP 125/65  Pulse Rate (!) 43  ECG Heart Rate (!) 39  Resp 17  SpO2 99 %  Assess: MEWS Score  MEWS Temp 0  MEWS Systolic 0  MEWS Pulse 2  MEWS RR 0  MEWS LOC 0  MEWS Score 2  MEWS Score Color Yellow  Take Vital Signs  Increase Vital Sign Frequency  Yellow: Q 2hr X 2 then Q 4hr X 2, if remains yellow, continue Q 4hrs (previous yellow, cont q4)  Escalate  MEWS: Escalate Yellow: discuss with charge nurse/RN and consider discussing with provider and RRT  Notify: Charge Nurse/RN  Name of Charge Nurse/RN Notified Jaquetta RN  Date Charge Nurse/RN Notified 04/04/20  Time Charge Nurse/RN Notified 2000  Notify: Rapid Response  Name of Rapid Response RN Notified  (no need, pt known brady)  Document  Patient Outcome Stabilized after interventions  Progress note created (see row info) Yes

## 2020-04-05 NOTE — Progress Notes (Addendum)
Nutrition Follow-up  DOCUMENTATION CODES:   Severe malnutrition in context of chronic illness  INTERVENTION:   -MVI with minerals BID -500 mg calcium carbonate TID -Double protein portions with meals -Obtained food preferences and entered into meal ordering system -Agree with liberalized diet of regular  NUTRITION DIAGNOSIS:   Severe Malnutrition related to chronic illness (colovesical fistula) as evidenced by moderate fat depletion,severe fat depletion,moderate muscle depletion,severe muscle depletion.  Ongoing  GOAL:   Patient will meet greater than or equal to 90% of their needs  Progressing   MONITOR:   PO intake,Supplement acceptance,Diet advancement,Labs,Weight trends,Skin,I & O's  REASON FOR ASSESSMENT:   Malnutrition Screening Tool    ASSESSMENT:   55 year old male who presented to the ED on 12/31 after being found on the floor of his home with nephrostomy tubes out. 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 Admitted with acute renal failure superimposed on CKD IV, metabolic acidosis, hydronephrosis, and severe sepsis secondary to UTI.  01/01 - s/p bilateral percutaneous nephrostomy tube placement and right IJ non-tunneled HD catheter placement, attempted HD with hypotension, rapid response and PCCM consult  Reviewed I/O's: +1.8 L x 24 hours and +7.1 L since admission  UOP: 1.7 L x 24 hours  Per nephrology notes, pt had HD on 04/02/19 and 04/04/19; plan to re-evaluate need for HD daily. No plans for HD today due to signs of renal recovery.   Spoke with pt at bedside, who was alert and pleasant at time of visit. He reports feeling better today. He shares with this RD that he has experienced a general decline in health over the past year secondary to multiple  health challenges, including MI and stroke. Pt shares that he he further declined after having COVID about 3-4 months ago and lost about 60# during that time period. However, this is not consistent with wt hx.   Per pt, his appetite comes and goes at baseline. Pt is a very selective eater and go to foods include raisin bran and [employer]. He did not eat breakfast this morning, but is looking forward to tilapia at lunch.   Discussed importance of good nutirtional intake to promote healing. Offered supplements, but pt politely decline ("they tried to push those on me when I had COVID, but I told them to forget it"). Pt is interested in maximizing his nutritional intake from food and focusing on protein. He is amenable to double portions of protein on his meal trays.   Medications reviewed and include renvela.  Per palliative care consult note, pt desires full scop care at this time.    Labs reviewed: Na: 134, CBGS: 85-102 (inpatient orders for glycemic control are none).   NUTRITION - FOCUSED PHYSICAL EXAM:  Flowsheet Row Most Recent Value  Orbital Region Mild depletion  Upper Arm Region Severe depletion  Thoracic and Lumbar Region Severe depletion  Buccal Region Mild depletion  Temple Region Mild depletion  Clavicle Bone Region Severe depletion  Clavicle and Acromion Bone Region Severe depletion  Scapular Bone Region Severe depletion  Dorsal Hand Severe depletion  Patellar Region Severe depletion  Anterior Thigh Region Severe depletion  Posterior Calf Region Severe depletion  Edema (RD Assessment) None  Hair Reviewed  Eyes Reviewed  Mouth Reviewed  Skin Reviewed  Nails Reviewed       Diet Order:  Diet Order            Diet regular Room service appropriate? Yes; Fluid consistency: Thin  Diet effective now                 EDUCATION NEEDS:   Education needs have been addressed  Skin:  Skin Assessment: Skin Integrity Issues: Skin Integrity Issues::  Stage II,Other (Comment) Stage II: coccyx Other: puncture wound right buttocks  Last BM:  04/03/20  Height:   Ht Readings from Last 1 Encounters:  03/31/20 5\' 6"  (1.676 m)    Weight:   Wt Readings from Last 1 Encounters:  04/05/20 60.7 kg   BMI:  Body mass index is 21.6 kg/m.  Estimated Nutritional Needs:   Kcal:  1900-2100  Protein:  120-135 grams  Fluid:  > 1.9 L    Loistine Chance, RD, LDN, [place] Registered Dietitian II Certified Diabetes Care and Education Specialist Please refer to [facility] for RD and/or RD on-call/weekend/after hours pager

## 2020-04-05 NOTE — Progress Notes (Signed)
[facility] Progress Note    Assessment/ Plan:   Assessment/Recommendations: MICO SPARK Sr. is a/an 55 y.o. male with a past medical history significant for gastric bypass, hodgkin's lymphoma, colovesical fistula, bilateral ureteral stenosis w/ ckd and hydronephrosiswho present w/ acute renal failure, sepsis, and hydronephrosis  Non-OliguricAKI on CKD V 2/2 urinary obstruction and dehydration w/ severe electrolyte derrangements.CKD secondary to longstanding obstruction. AKI likely dehydration with obstruction contributing also with sepsis likely leading to ATN. Severe electrolyte derangements with severe acidosis and likely uremia.  - HD #1 1/121, HD #2 04/04/19 --> he is making fairly good UOP  -Continue with IV hydration with LR -Appreciate IR help with temporary HD catheter and nephrostomy tubes - he appears quite frail and therefore is a suboptimal dialysis candidate long-term - looks like cr is plateauing- will hold off on dialysis today and make a daily assessment  Bilateral hydronephrosis: History of such presenting with removal of his nephrostomy tubes and hydronephrosis. IR consulted placed bilateral PCN, appreciate help.    Sepsis: Likely urosepsis. Blood cultures no growth for 48 hours.   Urine cultures with multiple organisms suggestive recollection but likely unnecessary, this is likely multimicrobial given the urine cultures from each nephrostomy tube and urine grew the same thing.  Continue antibiotics per primary team  Severe metabolic acidosis: pH 3.151 now significantly improved with IV bicarbonate and dialysis.  Hypokalemia: replete prn  Anemia: Multifactorial with hemoglobin less than 7 today.  Transfusion per primary team.  Consider ESA in the future  Hyperphosphatemia: Phosphorus checked and greater than 30. Dialysis and PCN placement as above.   Dispo: weak and not eating- he has a FTT picture on top of all this- I think his  prognosis isn't good even if he is able to come off dialysis and I appreciate pall care   Subjective:    More interactive today.      Objective:   BP 122/68 (BP Location: Left Arm)   Pulse (!) 56   Temp 98.3 F (36.8 C) (Oral)   Resp 17   Ht 5\' 6"  (1.676 m)   Wt 60.7 kg   SpO2 98%   BMI 21.60 kg/m   Intake/Output Summary (Last 24 hours) at 04/05/2020 1304 Last data filed at 04/05/2020 1300 Gross per 24 hour  Intake 2644.4 ml  Output 1250 ml  Net 1394.4 ml   Weight change: 4.1 kg  Physical Exam: Gen: NAD, lying in bed, frail and pale CVS: RRR Resp: clear Abd: soft GU: bilateral PCN tubes in place, draining yellow urine Ext: no LE edema ACCESS: R IJ nontunneled HD cath  Imaging: No results found.  Labs: BMET Recent Labs  Lab 03/31/20 1420 04/01/20 0213 04/01/20 0841 04/02/20 0312 04/03/20 0401 04/04/20 0447 04/05/20 0158  NA 133* 141 145 142 143 136 134*  K 5.1 4.2 3.0* 3.0* 3.0* 3.4* 3.9  CL 108 108 106 102 104 101 102  CO2 <7* <7* 9* 18* 17* 23 23  GLUCOSE 126* 88 76 75 86 98 89  BUN 136* 134* 133* 59* 69* 28* 33*  CREATININE 16.69* 15.49* 15.34* 7.58* 8.39* 4.22* 4.43*  CALCIUM 7.9* 7.3* 6.6* 6.8* 7.3* 7.0* 7.0*  PHOS  --  >30.0*  --  4.2  --   --   --    CBC Recent Labs  Lab 03/31/20 1420 04/01/20 0213 04/03/20 0401 04/04/20 0447 04/04/20 2054 04/05/20 0158  WBC 16.6*   < > 12.5* 10.3 10.2 9.1  NEUTROABS 15.1*  --   --   --  8.2*  --   HGB 8.3*   < > 7.1* 7.0* 8.3* 8.2*  HCT 24.7*   < > 20.4* 20.7* 23.7* 24.0*  MCV 92.2   < > 85.7 87.7 88.8 88.6  PLT 262   < > 180 159 166 175   < > = values in this interval not displayed.    Medications:    . Chlorhexidine Gluconate Cloth  6 each Topical Daily  . DULoxetine  60 mg Oral Daily   And  . DULoxetine  30 mg Oral QHS  . heparin  5,000 Units Subcutaneous Q8H  . multivitamin with minerals  1 tablet Oral Daily  . pantoprazole  40 mg Oral QHS  . sevelamer carbonate  800 mg Oral TID WC  .  sodium bicarbonate  650 mg Oral BID  . sodium chloride flush  5 mL Intracatheter Q8H  . tamsulosin  0.4 mg Oral QHS      Madelon Lips, MD 04/05/2020, 1:04 PM

## 2020-04-06 DIAGNOSIS — N185 Chronic kidney disease, stage 5: Secondary | ICD-10-CM | POA: Diagnosis not present

## 2020-04-06 DIAGNOSIS — Z8673 Personal history of transient ischemic attack (TIA), and cerebral infarction without residual deficits: Secondary | ICD-10-CM

## 2020-04-06 DIAGNOSIS — N179 Acute kidney failure, unspecified: Secondary | ICD-10-CM | POA: Diagnosis not present

## 2020-04-06 DIAGNOSIS — R627 Adult failure to thrive: Secondary | ICD-10-CM | POA: Diagnosis not present

## 2020-04-06 DIAGNOSIS — N170 Acute kidney failure with tubular necrosis: Secondary | ICD-10-CM | POA: Diagnosis not present

## 2020-04-06 DIAGNOSIS — Z7189 Other specified counseling: Secondary | ICD-10-CM | POA: Diagnosis not present

## 2020-04-06 LAB — GLUCOSE, CAPILLARY
Glucose-Capillary: 88 mg/dL (ref 70–99)
Glucose-Capillary: 94 mg/dL (ref 70–99)
Glucose-Capillary: 92 mg/dL (ref 70–99)

## 2020-04-06 LAB — BASIC METABOLIC PANEL
Anion gap: 11 (ref 5–15)
BUN: 39 mg/dL — ABNORMAL HIGH (ref 6–20)
CO2: 21 mmol/L — ABNORMAL LOW (ref 22–32)
Calcium: 6.8 mg/dL — ABNORMAL LOW (ref 8.9–10.3)
Chloride: 101 mmol/L (ref 98–111)
Creatinine, Ser: 4.74 mg/dL — ABNORMAL HIGH (ref 0.61–1.24)
GFR, Estimated: 14 mL/min — ABNORMAL LOW (ref >60)
Glucose, Bld: 87 mg/dL (ref 70–99)
Potassium: 4.4 mmol/L (ref 3.5–5.1)
Sodium: 133 mmol/L — ABNORMAL LOW (ref 135–145)

## 2020-04-06 LAB — CBC
HCT: 24.3 % — ABNORMAL LOW (ref 39.0–52.0)
Hemoglobin: 8.3 g/dL — ABNORMAL LOW (ref 13.0–17.0)
MCH: 30.7 pg (ref 26.0–34.0)
MCHC: 34.2 g/dL (ref 30.0–36.0)
MCV: 90 fL (ref 80.0–100.0)
Platelets: 168 10*3/uL (ref 150–400)
RBC: 2.7 MIL/uL — ABNORMAL LOW (ref 4.22–5.81)
RDW: 15.4 % (ref 11.5–15.5)
WBC: 7.9 10*3/uL (ref 4.0–10.5)
nRBC: 0 % (ref 0.0–0.2)

## 2020-04-06 NOTE — Evaluation (Signed)
Occupational Therapy Evaluation Patient Details Name: Chase TURSI Sr. MRN: [id number] DOB: 1965-11-12 Today's Date: 04/06/2020    History of Present Illness 55 year old male with PMH of Hodgkin's lymphoma s/p chemotherapy 2011-2012, gastric bypass in 6294 complicated by anastomotic ulcer in 2014, colovesical fistula with abscess s/p PC drain, stage IV CKD, bilateral ureteral stenosis with ureteral stents and bilateral percutaneous nephrostomy tubes, history of CVA in 2021 (residual L sided weakness). He was admitted for acute renal failure complicating stage V CKD secondary to urinary obstruction and dehydration with severe electrolyte derangements.  Urology was consulted.  Patient underwent hemodialysis.   Clinical Impression   Pt has been at home and mod I with Rollator and DME. Today Pt is mod A +2 for squat pivot up the bed, mod A for UB bathing, max A for LB ADL. Pt presents with deconditioning, decreased activity tolerance, and will require skilled OT in the acute setting as well as afterwards at the the SNF level. Next session to focus on OOB activity and energy conservation education.     Follow Up Recommendations  SNF    Equipment Recommendations  Other (comment) (defer to next venue)    Recommendations for Other Services       Precautions / Restrictions Precautions Precautions: Fall Precaution Comments: multiple nephrostomy bags and bulb Restrictions Weight Bearing Restrictions: No      Mobility Bed Mobility Overal bed mobility: Needs Assistance Bed Mobility: Supine to Sit;Sit to Supine     Supine to sit: Mod assist;+2 for safety/equipment Sit to supine: Mod assist;+2 for safety/equipment   General bed mobility comments: pt with multiple drains    Transfers Overall transfer level: Needs assistance   Transfers: Squat Pivot Transfers     Squat pivot transfers: +2 physical assistance;+2 safety/equipment;Mod assist     General transfer comment: squat pivot  to scoot up in bed to reposition    Balance Overall balance assessment: Needs assistance Sitting-balance support: Bilateral upper extremity supported;Feet supported Sitting balance-Leahy Scale: Poor                                     ADL either performed or assessed with clinical judgement   ADL Overall ADL's : Needs assistance/impaired Eating/Feeding: Moderate assistance;Bed level Eating/Feeding Details (indicate cue type and reason): "i can't sit up right now" Grooming: Wash/dry hands;Wash/dry face;Set up;Sitting Grooming Details (indicate cue type and reason): EOB Upper Body Bathing: Moderate assistance;Sitting   Lower Body Bathing: Maximal assistance;Sitting/lateral leans   Upper Body Dressing : Moderate assistance;Sitting   Lower Body Dressing: Maximal assistance;Sitting/lateral leans   Toilet Transfer: Moderate assistance;+2 for physical assistance;+2 for safety/equipment;Squat-pivot   Toileting- Clothing Manipulation and Hygiene: Maximal assistance;Bed level     Tub/Shower Transfer Details (indicate cue type and reason): sponge bathes due to nephrostomy drains Functional mobility during ADLs: Moderate assistance;+2 for physical assistance;+2 for safety/equipment (squat pivot only) General ADL Comments: deconditioned, weak, decreased balance and activity tolerance     Vision         Perception     Praxis      Pertinent Vitals/Pain Pain Assessment: Faces Faces Pain Scale: Hurts even more Pain Location: back Pain Descriptors / Indicators: Aching;Guarding Pain Intervention(s): Monitored during session;Repositioned     Hand Dominance Right   Extremity/Trunk Assessment Upper Extremity Assessment Upper Extremity Assessment: Generalized weakness   Lower Extremity Assessment Lower Extremity Assessment: Defer to PT evaluation   Cervical /  Trunk Assessment Cervical / Trunk Assessment: Kyphotic   Communication Communication Communication: No  difficulties   Cognition Arousal/Alertness: Awake/alert Behavior During Therapy: WFL for tasks assessed/performed Overall Cognitive Status: Within Functional Limits for tasks assessed                                     General Comments       Exercises     Shoulder Instructions      Home Living Family/patient expects to be discharged to:: Private residence Living Arrangements: Alone Available Help at Discharge: Family;Friend(s);Available PRN/intermittently Type of Home: House Home Access: Stairs to enter [employer] of Steps: 5 Entrance Stairs-Rails: None Home Layout: One level     Bathroom Shower/Tub: Occupational psychologist: Handicapped height Bathroom Accessibility: Yes How Accessible: Accessible via walker Home Equipment: Shower seat - built in;Cane - single point;Walker - 4 wheels   Additional Comments: frequent falls at home, poor nutrition at home      Prior Functioning/Environment Level of Independence: Needs assistance  Gait / Transfers Assistance Needed: RW for ambulation     Comments: prior CVA in July, prior [employer] and enjoys fishing        OT Problem List: Decreased activity tolerance;Decreased strength;Impaired balance (sitting and/or standing);Decreased safety awareness      OT Treatment/Interventions: Self-care/ADL training;Therapeutic exercise;Energy conservation;DME and/or AE instruction;Therapeutic activities;Patient/family education;Balance training    OT Goals(Current goals can be found in the care plan section) Acute Rehab OT Goals Patient Stated Goal: I want to eat the food OT Goal Formulation: With patient Time For Goal Achievement: 04/20/20 Potential to Achieve Goals: Good  OT Frequency: Min 2X/week   Barriers to D/C: Decreased caregiver support          Co-evaluation PT/OT/SLP Co-Evaluation/Treatment: Yes Reason for Co-Treatment: Complexity of the patient's impairments (multi-system  involvement);Necessary to address cognition/behavior during functional activity;For patient/therapist safety;To address functional/ADL transfers PT goals addressed during session: Mobility/safety with mobility;Balance;Strengthening/ROM OT goals addressed during session: ADL's and self-care;Strengthening/ROM;Proper use of Adaptive equipment and DME      AM-PAC OT "6 Clicks" Daily Activity     Outcome Measure Help from another person eating meals?: A Little Help from another person taking care of personal grooming?: A Little Help from another person toileting, which includes using toliet, bedpan, or urinal?: A Lot Help from another person bathing (including washing, rinsing, drying)?: A Lot Help from another person to put on and taking off regular upper body clothing?: A Little Help from another person to put on and taking off regular lower body clothing?: A Lot 6 Click Score: 15   End of Session Nurse Communication: Mobility status  Activity Tolerance: Patient limited by fatigue Patient left: in bed;with call bell/phone within reach;with bed alarm set  OT Visit Diagnosis: Unsteadiness on feet (R26.81);Other abnormalities of gait and mobility (R26.89);Muscle weakness (generalized) (M62.81);Adult, failure to thrive (R62.7)                Time: 1230-1253 OT Time Calculation (min): 23 min Charges:  OT General Charges $OT Visit: 1 Visit OT Evaluation $OT Eval Moderate Complexity: [place] OTR/L Acute Rehabilitation Services Pager: [phone] Office: [place] 04/06/2020, 2:18 PM

## 2020-04-06 NOTE — Progress Notes (Signed)
[facility] Progress Note    Assessment/ Plan:   Assessment/Recommendations: Chase [name] Sr. is a/an 55 y.o. male with a past medical history significant for gastric bypass, hodgkin's lymphoma, colovesical fistula, bilateral ureteral stenosis w/ ckd and hydronephrosiswho present w/ acute renal failure, sepsis, and hydronephrosis  Non-OliguricAKI on CKD V 2/2 urinary obstruction and dehydration w/ severe electrolyte derrangements.CKD secondary to longstanding obstruction. AKI likely dehydration with obstruction contributing also with sepsis likely leading to ATN. Severe electrolyte derangements with severe acidosis and likely uremia.  - HD #1 1/121, HD #2 04/04/19 --> he is making fairly good UOP  -Continue with IV hydration with LR -Appreciate IR help with temporary HD catheter and nephrostomy tubes - he appears quite frail and therefore is a suboptimal dialysis candidate long-term- I asked him yesterday if he would want chronic HD and he stated that that was a hard thing to think about.  My suspicion is that chronic HD would not align with his goals of being independent for the rest of his life but again this is just my assumption rather than hearing it directly from him--> will need to be elucidated - no further dialysis needed at this time- will d/c catheter  Bilateral hydronephrosis: History of such presenting with removal of his nephrostomy tubes and hydronephrosis. IR consulted placed bilateral PCN, appreciate help.    Sepsis: Likely urosepsis. Blood cultures no growth for 48 hours.   Urine cultures with multiple organisms suggestive recollection but likely unnecessary, this is likely multimicrobial given the urine cultures from each nephrostomy tube and urine grew the same thing.  Continue antibiotics per primary team  Severe metabolic acidosis: pH 1.937 now significantly improved with IV bicarbonate and dialysis.  Hypokalemia: replete prn  Anemia:  Multifactorial with hemoglobin less than 7 today.  Transfusion per primary team.  Consider ESA in the future  Hyperphosphatemia: Phosphorus checked and greater than 30. Dialysis and PCN placement as above.   Dispo: weak and not eating- he has a FTT picture on top of all this- I think his prognosis isn't good even if he is able to come off dialysis and I appreciate pall care   Subjective:    Appears to be feeling better.  Cr continues to inch upwards but still below baseline.  He shares with me that he is a very independent person and wants to live the rest of his life on his terms.  He shares about his family and and the bond they share.      Objective:   BP 126/77   Pulse 63   Temp 99 F (37.2 C)   Resp 20   Ht 5\' 6"  (1.676 m)   Wt 59.1 kg   SpO2 100%   BMI 21.03 kg/m   Intake/Output Summary (Last 24 hours) at 04/06/2020 1242 Last data filed at 04/06/2020 1028 Gross per 24 hour  Intake 2301.71 ml  Output 2400 ml  Net -98.29 ml   Weight change: -1.6 kg  Physical Exam: Gen: NAD, lying in bed, frail and pale CVS: RRR Resp: clear Abd: soft GU: bilateral PCN tubes in place, draining yellow urine Ext: no LE edema ACCESS: R IJ nontunneled HD cath  Imaging: No results found.  Labs: BMET Recent Labs  Lab 04/01/20 0213 04/01/20 0841 04/02/20 0312 04/03/20 0401 04/04/20 0447 04/05/20 0158 04/06/20 0352  NA 141 145 142 143 136 134* 133*  K 4.2 3.0* 3.0* 3.0* 3.4* 3.9 4.4  CL 108 106 102 104 101 102 101  CO2 <7* 9* 18* 17* 23 23 21*  GLUCOSE 88 76 75 86 98 89 87  BUN 134* 133* 59* 69* 28* 33* 39*  CREATININE 15.49* 15.34* 7.58* 8.39* 4.22* 4.43* 4.74*  CALCIUM 7.3* 6.6* 6.8* 7.3* 7.0* 7.0* 6.8*  PHOS >30.0*  --  4.2  --   --   --   --    CBC Recent Labs  Lab 03/31/20 1420 04/01/20 0213 04/04/20 0447 04/04/20 2054 04/05/20 0158 04/06/20 0352  WBC 16.6*   < > 10.3 10.2 9.1 7.9  NEUTROABS 15.1*  --   --  8.2*  --   --   HGB 8.3*   < > 7.0* 8.3* 8.2* 8.3*   HCT 24.7*   < > 20.7* 23.7* 24.0* 24.3*  MCV 92.2   < > 87.7 88.8 88.6 90.0  PLT 262   < > 159 166 175 168   < > = values in this interval not displayed.    Medications:    . calcium carbonate  1 tablet Oral TID  . Chlorhexidine Gluconate Cloth  6 each Topical Daily  . DULoxetine  60 mg Oral Daily   And  . DULoxetine  30 mg Oral QHS  . heparin  5,000 Units Subcutaneous Q8H  . multivitamin with minerals  1 tablet Oral BID  . pantoprazole  40 mg Oral QHS  . sevelamer carbonate  800 mg Oral TID WC  . sodium bicarbonate  650 mg Oral BID  . sodium chloride flush  5 mL Intracatheter Q8H  . tamsulosin  0.4 mg Oral QHS      Madelon Lips, MD 04/06/2020, 12:42 PM

## 2020-04-06 NOTE — Evaluation (Addendum)
Physical Therapy Evaluation Patient Details Name: Chase BRABSON Sr. 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 He was admitted for acute renal failure complicating stage V CKD secondary to urinary obstruction and dehydration with severe electrolyte derangements.  Urology was consulted.  Patient underwent hemodialysis.  Clinical Impression  Pt with limited participation in session due to pain and fatigue. Pt lives alone and states he was I prior to admission with use of rollator. Pt flat during session but able to participate and engaged in conversation. Pt demonstrating poor activity tolerance and initiation of movement, Pt demonstrating weakness in B LE (L>R). Pt requiring increased assist for bed mobility and to scoot up in bed to reposition, +2 needed for drain management and some assist. Pt demonstrating deficits in balance, strength, coordination, gait, endurance, safety and pain and will benefit from skilled PT to address deficits to maximize independence with functional mobility prior to discharge.     Follow Up Recommendations SNF    Equipment Recommendations  None recommended by PT    Recommendations for Other Services       Precautions / Restrictions Restrictions Weight Bearing Restrictions: No      Mobility  Bed Mobility Overal bed mobility: Needs Assistance Bed Mobility: Supine to Sit;Sit to Supine     Supine to sit: Mod assist;+2 for safety/equipment Sit to supine: Mod assist;+2 for safety/equipment   General bed mobility comments: pt with multiple drains    Transfers Overall  transfer level: Needs assistance   Transfers: Squat Pivot Transfers     Squat pivot transfers: +2 physical assistance;+2 safety/equipment     General transfer comment: squat pivot to scoot up in bed to reposition  Ambulation/Gait                Stairs            Wheelchair Mobility    Modified Rankin (Stroke Patients Only)       Balance Overall balance assessment: Needs assistance Sitting-balance support: Bilateral upper extremity supported;Feet supported Sitting balance-Leahy Scale: Poor                                       Pertinent Vitals/Pain Pain Assessment: Faces Faces Pain Scale: Hurts even more Pain Location: back Pain Descriptors / Indicators: Aching;Guarding Pain Intervention(s): Repositioned    Home Living Family/patient expects to be discharged to:: Private residence Living Arrangements: Alone Available Help at Discharge: Family;Friend(s);Available PRN/intermittently Type of Home: House Home Access: Stairs to enter Entrance Stairs-Rails: None Entrance Stairs-Number of Steps: 5; pt has a ramp Home Layout: One level Home Equipment: Shower seat - built in;Cane - single point;Walker - 4 wheels Additional Comments: frequent falls at home, poor nutrition at home    Prior Function Level of Independence: Needs assistance   Gait / Transfers Assistance Needed: RW for ambulation     Comments: prior CVA in July     Hand Dominance   Dominant Hand: Right    Extremity/Trunk Assessment   Upper Extremity Assessment Upper Extremity Assessment: Defer to OT evaluation    Lower Extremity Assessment  Lower Extremity Assessment: Generalized weakness    Cervical / Trunk Assessment Cervical / Trunk Assessment: Kyphotic  Communication   Communication: No difficulties  Cognition Arousal/Alertness: Awake/alert Behavior During Therapy: WFL for tasks assessed/performed; flat but able to engage in conversation and activities with  increased cueing Overall Cognitive Status: Within Functional Limits for tasks assessed                                        General Comments      Exercises     Assessment/Plan    PT Assessment Patient needs continued PT services  PT Problem List Decreased strength;Decreased mobility;Decreased coordination;Decreased activity tolerance;Pain;Decreased balance       PT Treatment Interventions DME instruction;Therapeutic exercise;Gait training;Balance training;Stair training;Therapeutic activities;Patient/family education    PT Goals (Current goals can be found in the Care Plan section)  Acute Rehab PT Goals Patient Stated Goal: I want to eat the food PT Goal Formulation: With patient Time For Goal Achievement: 04/20/20 Potential to Achieve Goals: Fair    Frequency Min 2X/week   Barriers to discharge        Co-evaluation PT/OT/SLP Co-Evaluation/Treatment: Yes Reason for Co-Treatment: Complexity of the patient's impairments (multi-system involvement);For patient/therapist safety;To address functional/ADL transfers PT goals addressed during session: Mobility/safety with mobility;Balance;Strengthening/ROM         AM-PAC PT "6 Clicks" Mobility  Outcome Measure Help needed turning from your back to your side while in a flat bed without using bedrails?: A Little Help needed moving from lying on your back to sitting on the side of a flat bed without using bedrails?: A Lot Help needed moving to and from a bed to a chair (including a wheelchair)?: Total Help needed standing up from a chair using your arms (e.g., wheelchair or bedside chair)?: A Lot Help needed to walk in hospital room?: Total Help needed climbing 3-5 steps with a railing? : Total 6 Click Score: 10    End of Session   Activity Tolerance: Patient limited by fatigue;Patient limited by pain Patient left: in bed;with call bell/phone within reach;with nursing/sitter in room Nurse Communication:  Mobility status PT Visit Diagnosis: Unsteadiness on feet (R26.81);Difficulty in walking, not elsewhere classified (R26.2);Muscle weakness (generalized) (M62.81)    Time: 1007-1219 PT Time Calculation (min) (ACUTE ONLY): 23 min   Charges:   PT Evaluation $PT Eval Moderate Complexity: 1 Mod          Lyanne Co, DPT Acute Rehabilitation Services [phone]  [place] 04/06/2020, 1:35 PM

## 2020-04-06 NOTE — Progress Notes (Signed)
Referring Physician(s): Opyd, Ilene Qua [facility]); Santiago Bumpers J(nephrology)  Supervising Physician: Markus Daft  Patient Status:  [facility] - In-pt  Chief Complaint:  F/U Drains  Brief History:  Urinary obstruction secondary to pelvic mass with subsequent bilateral hydronephrosisin setting of bilateral ureteral stents, with bilateral PCNs inadvertently removed (unknown how long they have not been in place) s/p bilateral PCN placements in IR 04/01/2020.  Pelvic abscesswith evidence of colovesical fistulas/p leftTGdrain placement in IR 11/15/2019.  Subjective:  Patient laying in bed resting comfortably   Allergies: Nsaids and Ciprofloxacin  Medications: Prior to Admission medications   Medication Sig Start Date End Date Taking? Authorizing Provider  docusate sodium (COLACE) 100 MG capsule Take 100 mg by mouth 2 (two) times daily as needed (constipation).   Yes [provider]  Cholecalciferol (VITAMIN D3) 20 MCG (800 UNIT) TABS Take 800 Units by mouth daily.    [provider]  cyanocobalamin (,VITAMIN B-12,) 1000 MCG/ML injection Inject 1,000 mcg into the muscle every 30 (thirty) days.  12/28/13   [provider]  DULoxetine (CYMBALTA) 30 MG capsule Take 30-60 mg by mouth See admin instructions. Take 60 mg in the morning and 30 mg in the evening    [provider]  gabapentin (NEURONTIN) 100 MG capsule Take 2 capsules (200 mg total) by mouth daily. Patient taking differently: Take 800 mg by mouth 3 (three) times daily.  01/09/20 03/03/20  Elodia Florence., MD  hydrOXYzine (ATARAX/VISTARIL) 10 MG tablet Take 1 tablet (10 mg total) by mouth 3 (three) times daily as needed for itching or anxiety. Patient taking differently: Take 10 mg by mouth 3 (three) times daily as needed for anxiety. 10/26/19   Cristal Ford, DO  Multiple Vitamin (MULTIVITAMIN WITH MINERALS) TABS tablet Take 1 tablet by mouth daily. 10/27/19   Cristal Ford, DO   oxyCODONE-acetaminophen (PERCOCET/ROXICET) 5-325 MG tablet Take by mouth every 4 (four) hours as needed for severe pain.    [provider]  pantoprazole (PROTONIX) 20 MG tablet Take 20 mg by mouth 2 (two) times daily.    [provider]  peg 3350 powder (MOVIPREP) 100 g SOLR Take 1 kit (200 g total) by mouth as directed. 03/03/20   Gatha Mayer, MD  prazosin (MINIPRESS) 1 MG capsule Take 1 mg by mouth at bedtime.    [provider]  sevelamer carbonate (RENVELA) 800 MG tablet Take 800 mg by mouth 3 (three) times daily with meals.    [provider]  sodium bicarbonate 650 MG tablet Take 650 mg by mouth 2 (two) times daily.    [provider]  sodium chloride flush 0.9 % SOLN injection Inject 3 mLs into the vein daily.    [provider]  tamsulosin (FLOMAX) 0.4 MG CAPS capsule Take 0.4 mg by mouth at bedtime.     [provider]     Vital Signs: BP 113/80   Pulse 63   Temp 99 F (37.2 C)   Resp 20   Ht 5' 6"  (1.676 m)   Wt 59.1 kg   SpO2 100%   BMI 21.03 kg/m   Physical Exam Constitutional:      Appearance: Normal appearance.     Comments: Sleepy  HENT:     Head: Normocephalic and atraumatic.  Cardiovascular:     Rate and Rhythm: Normal rate.  Pulmonary:     Effort: Pulmonary effort is normal. No respiratory distress.  Abdominal:     Palpations: Abdomen  is soft.     Tenderness: There is no abdominal tenderness.  Skin:    General: Skin is warm and dry.  Neurological:     General: No focal deficit present.   PCNs and drain in place. Unchanged.  Imaging: No results found.  Labs:  CBC: Recent Labs    04/04/20 0447 04/04/20 2054 04/05/20 0158 04/06/20 0352  WBC 10.3 10.2 9.1 7.9  HGB 7.0* 8.3* 8.2* 8.3*  HCT 20.7* 23.7* 24.0* 24.3*  PLT 159 166 175 168    COAGS: Recent Labs    10/17/19 1241 10/17/19 1311 11/07/19 1836 11/07/19 1853 11/10/19 1052 12/08/19 1258 12/27/19 0450  01/26/20 1050 03/31/20 1420  INR 1.5* 1.6*   < >  --    < > 1.1 1.3* 1.2 1.5*  APTT 43* 46*  --  31  --   --   --   --  37*   < > = values in this interval not displayed.    BMP: Recent Labs    01/01/20 0430 01/02/20 0427 01/03/20 0506 01/04/20 0453 01/05/20 0558 04/03/20 0401 04/04/20 0447 04/05/20 0158 04/06/20 0352  NA 143 144 143 143   < > 143 136 134* 133*  K 3.5 3.9 3.9 3.9   < > 3.0* 3.4* 3.9 4.4  CL 116* 113* 112* 112*   < > 104 101 102 101  CO2 18* 19* 22 23   < > 17* 23 23 21*  GLUCOSE 73 84 81 77   < > 86 98 89 87  BUN 60* 61* 53* 47*   < > 69* 28* 33* 39*  CALCIUM 7.3* 7.5* 7.5* 7.4*   < > 7.3* 7.0* 7.0* 6.8*  CREATININE 6.54* 6.27* 6.42* 5.74*   < > 8.39* 4.22* 4.43* 4.74*  GFRNONAA 9* 9* 9* 10*   < > 7* 16* 15* 14*  GFRAA 10* 11* 10* 12*  --   --   --   --   --    < > = values in this interval not displayed.    LIVER FUNCTION TESTS: Recent Labs    12/28/19 0503 12/30/19 0603 01/06/20 0507 01/07/20 0449 01/08/20 0518 03/31/20 1420  BILITOT 0.3  --   --  0.3 0.7 0.9  AST 28  --   --  37 26 38  ALT 8  --   --  19 17 25   ALKPHOS 87  --   --  86 81 146*  PROT 6.0*  --   --  5.7* 5.6* 8.0  ALBUMIN 2.2*   < > 1.9* 2.0*  2.0* 2.0* 3.4*   < > = values in this interval not displayed.    Assessment and Plan:  Urinary obstruction secondary to pelvic mass with subsequent bilateral hydronephrosisin setting of bilateral ureteral stents, with bilateral PCNs inadvertently removed (unknown how long they have not been in place) s/p bilateral PCN placements in IR 04/01/2020. Continue routine care... Care per Urology  Pelvic abscesswith evidence of colovesical fistulas/p leftTGdrain placement in IR 11/15/2019. This drain will like stay in place until definitive surgery.  Will sign off.  Electronically Signed: Murrell Redden, PA-C 04/06/2020, 2:26 PM    I spent a total of 15 Minutes at the the patient's bedside AND on the patient's hospital floor or unit,  greater than 50% of which was counseling/coordinating care for f/u PCNs and drain

## 2020-04-06 NOTE — Progress Notes (Signed)
Patient ID: Chase [name]., male   DOB: [birth year], 55 y.o.   MRN: [id number]   Palliative Medicine Inpatient Follow Up Note   HPI: Past medical history of Hodgkin's lymphoma treated with chemotherapy, gastric bypass in 1996 for which he subsequently developed complications with anastomotic ulcer in 2014, colovesicular fistula associated with abscess status post percutaneous drain, chronic kidney disease stage IV, bilateral ureteral stenosis with ureteral stent and percutaneous bilateral nephrostomy tubes,  history of a CVA in 2021, PTSD, and opioid misuse admitted on 03/31/2020 with AMS and nephrostomy tubes out.   Found to have creatinine of 16. Also with severe urosepsis.   CT scan showed bilateral hydronephrosis with a left transgluteal drainage without fluid collection and a large colonic stool burden. Bilateral percutaneous nephrostomy tubes placed by IR 1/1. Plans for short term HD vs CRRT. Poor candidate for outpatient dialysis. PMT consulted to discuss [place].   Per Nephrology no need for dialysis at this time, temporary cath removed today. Patient is likely going to face decision regarding dialysis in the near future.  Patient faces treatment option decisions, advanced directive decisions and anticipatory care needs.   Today's Discussion :  Visited patient at bedside.   Continued education regarding current medical situation; diagnosis, prognosis, GOCs, EOL wishes, disposition and anticipatory care needs.  Education offered regarding ESRD and long term dialysis.  Discussed trajectory and associated care needs.   Patient is abrupt and disengaged today.  "I'm tired of this whole situation and I don't want to talk about it".  "I'm going home"  Emotional support offered.  Discussed that pateint has the right to make decisions as long as he has capacity and willing to accept the consequences.. He has no family/social support to help him at home.  The difference between an  aggressive medical intervention path and a more palliative comfort path for this patient at this time in this situation detailed.   Education oh hospice benefit offered.  Spoke to son/[name]  by telephone   His son is frustrated with the patient.  Feels patient has little consideration to the reality of his care needs and limited support available to him.  Although patient ultimately desires to return home, family is concerned for safety   Raised awareness to importance of ACP decisions and next steps in treatment plan.  Recommendation for DNR/DNI: Encouraged patient/family to consider DNR/DNI status understanding evidenced based poor outcomes in similar hospitalized patient, as the cause of arrest is likely associated with advanced chronic illness rather than an easily reversible acute cardio-pulmonary event.    Patient remains Full Code  Discussed the importance of continued conversation with his family and the medical providers regarding overall plan of care and treatment options, ensuring decisions are within the context of the patients values and GOCs.  Questions and concerns addressed     Vital Signs Vitals:   04/06/20 1326 04/06/20 1400  BP: 113/80 121/78  Pulse:    Resp:  11  Temp:    SpO2:      Intake/Output Summary (Last 24 hours) at 04/06/2020 1647 Last data filed at 04/06/2020 1500 Gross per 24 hour  Intake 2181.71 ml  Output 1675 ml  Net 506.71 ml   Last Weight  Most recent update: 04/06/2020 12:40 AM   Weight  59.1 kg (130 lb 4.7 oz)            Recommendations and Plan:  -Full code -refuses to discuss any decisions regarding plan of care -PMT will continue  to support holistically  Discussed with Dr.Hongalgi  Time Spent: 45 minutes Greater than 50% of the time was spent in counseling and coordination of care ______________________________________________________________________________________ Wadie Lessen NP  [place] Team Team  Cell Phone: [phone] Please utilize secure chat with additional questions, if there is no response within 30 minutes please call the above phone number  Palliative Medicine Team providers are available by phone from 7am to 7pm daily and can be reached through the team cell phone.  Should this patient require assistance outside of these hours, please call the patient's attending physician.

## 2020-04-06 NOTE — Progress Notes (Signed)
Pt refused chg bath this morn. Attempted to reeducate and pt state "I just dont like the way it feels" Pt L nephro tube output is blooy. This nurse has been caring for pt 3 nights and this is the first time urine has been this red.

## 2020-04-06 NOTE — Progress Notes (Signed)
PROGRESS NOTE   Chase [name]  BWI:[id number]    DOB: [birth year]    DOA: 03/31/2020  PCP: [name]   I have briefly reviewed patients previous medical records in [facility].  Chief Complaint  Patient presents with  . 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  Creatinine was 16.69 (in October 2021: Creatinine 5.76).  He was admitted for acute renal failure complicating stage V CKD secondary to urinary obstruction and dehydration with severe electrolyte derangements.  Urology was consulted.  Patient underwent hemodialysis.  Improving.   Assessment & Plan:  Principal Problem:   Sepsis with acute renal failure without septic shock (HCC) Active Problems:   Chronic pain   Colovesical fistula   Acute renal failure superimposed on stage 5 chronic kidney disease, not on chronic dialysis ([place])   History of gastric bypass   Hodgkin lymphoma, unspecified, unspecified site (HCC)   Metabolic acidosis   Toxic metabolic encephalopathy   PTSD (post-traumatic stress disorder)   Bilateral ureteral obstruction s/p perc nephrosotmy & stenting   History of CVA (cerebrovascular accident)   DNR (do not resuscitate) discussion   Palliative care by specialist   Adult failure to thrive   Acute renal failure complicating stage V chronic kidney disease:  Nephrology consulted and assisted with evaluation and management.  Creatinine 5.76 in October 2021.  Presented with creatinine of 16.69.  CKD likely secondary to longstanding obstruction.  AKI due to dehydration, sepsis leading to ATN on the backdrop  of obstruction.  Had severe electrolyte derangements with severe acidosis and likely uremia on presentation.  Treating with IV fluids.  Bilateral percutaneous nephrostomy tubes were replaced by IR on 04/01/2020.  Temporary HD catheter placed by IR and patient underwent 2 cycles of hemodialysis thus far. Last HD was on 1/3  I discussed with Dr. Hollie Salk, nephrology.  Does not think dialysis needed at this time and will discontinue the temporary HD catheter today.  Creatinine slowly creeping up but still within his baseline.  She does not think that chronic HD would align with his goals of being independent for the rest of his life.  I requested her to discuss again with him in the morning and with his son's.  Potential DC tomorrow.  Bilateral hydronephrosis  Patient does not recollect that his nephrostomy tubes were out on admission.  He insists that they were not out.  IR was consulted and placed back bilateral PCNs.  Outpatient follow-up with IR.  Severe metabolic acidosis:  Bicarbonate <7 on admission.  Lactate was normal.  Due to acute renal failure  Resolved.  Remains on sodium bicarbonate, discontinue?  Hypokalemia  Replaced.  Follow BMP.  Hyperphosphatemia  Was >30.  Resolved.  Is on Renvela.  Severe sepsis secondary to UTI, POA  Encephalopathy can hypothermic on admission with leukocytosis.  Multiple urine culture samples from 12/31 and 1/1 show multiple species.  Blood cultures x2 from 12/31: Negative, final report.  CT renal stone study from 12/31: Bladder wall appeared slightly thickened.  No significant fluid collection in the pelvis.  As per earlier plans, complete 7 days of IV cefepime (started 12/31).  No antibiotics  at DC.  Sepsis physiology resolved.  Anemia in CKD  S/p 2 units PRBCs  Hemoglobin stable in the 8 g range.  Acute metabolic/uremic encephalopathy  His encephalopathy was likely multifactorial due to uremia, infection and medications  (Cymbalta-resumed, oxycodone and Neurontin).  Mental status has significantly improved but still appeared slightly confused at times this morning.  Monitor.  As per detailed discussion with his son today, patient's overall condition has taken a downward turn since his spouse passed away 6 years ago.  History of pelvic abscess 11/15/2019, s/p ATG drain by IR/colovesical fistula  He has a transgluteal JP drain for his abscess with no significant fluid collection seen in the pelvis on CT on admission.  He is planned for colonoscopy and surgery later in January 2021.  As per my communication with [facility], he set up for colonoscopy with Dr. Rush Landmark next week prior to surgery.  Outpatient follow-up with [facility] and surgery  Asymptomatic sinus bradycardia  On telemetry today, mostly in the 40s-50s  Asymptomatic.  Need to see how his heart rate does with activity.    Avoid rate limiting medications  TSH 1.382.  Chronic pain  Judicious and avoid opioids unless absolutely needed.  History of CVA  Ischemic CVA in July 2021, history of A. fib not candidate for anticoagulation due to GI bleed and thrombocytopenia.  No focal deficits.  PTSD  Holding sedatives.  History of gastric bypass  Continue PPI  History of Hodgkin's lymphoma, s/p chemotherapy  Noted.  Outpatient follow-up.  Goals of care  Palliative care medicine consultation 1/4 appreciated: Full code, full scope of treatment and open to SNF for rehab.  Palliative care may need to follow as outpatient  Body mass index is 21.03 kg/m.  Nutritional Status Nutrition Problem: Severe Malnutrition Etiology: chronic illness (colovesical fistula) Signs/Symptoms: moderate fat depletion,severe fat depletion,moderate muscle depletion,severe muscle depletion Interventions: MVI,Refer to RD note for recommendations   Physical deconditioning  Have not seen patient out of bed over the last 2 days.  Therapies have not  evaluated either.  Requested therapies evaluation.    DVT prophylaxis: heparin injection 5,000 Units Start: 04/01/20 2200     Code Status: Full Code Family Communication: I discussed in detail with patient's son via phone, updated care and answered questions. Disposition:  Status is: Inpatient  Remains inpatient appropriate because:Inpatient level of care appropriate due to severity of illness   Dispo: The patient is from: SNF              Anticipated d/c is to: SNF.  Pending PT evaluation.  Unsure if patient will even agree to go to rehab.              Anticipated d/c date is: 1 day              Patient currently is not medically stable to d/c.        Consultants:   Nephrology Interventional radiology Palliative care medicine  Procedures:   As noted above  Antimicrobials:    Anti-infectives (From admission, onward)   Start     Dose/Rate Route Frequency Ordered Stop   04/03/20 1600  ceFEPIme (MAXIPIME) 1 g in sodium chloride 0.9 % 100 mL IVPB        1 g 200 mL/hr over 30 Minutes Intravenous Every 24 hours 04/03/20 0918 04/07/20 1559   04/01/20 1600  ceFEPIme (MAXIPIME) 1 g in sodium chloride 0.9 % 100 mL IVPB  Status:  Discontinued  1 g 200 mL/hr over 30 Minutes Intravenous Every 24 hours 03/31/20 1550 04/03/20 0918   04/01/20 1145  cefTRIAXone (ROCEPHIN) 1 g in sodium chloride 0.9 % 100 mL IVPB        1 g 200 mL/hr over 30 Minutes Intravenous  Once 04/01/20 1057 04/01/20 1308   03/31/20 1415  ceFEPIme (MAXIPIME) 2 g in sodium chloride 0.9 % 100 mL IVPB        2 g 200 mL/hr over 30 Minutes Intravenous  Once 03/31/20 1407 03/31/20 1516        Subjective:  Patient again insisting on being discharged.  No specific complaints reported.  Overnight events noted regarding some blood in urine bag.  No pain.  Objective:   Vitals:   04/06/20 0518 04/06/20 0522 04/06/20 0800 04/06/20 1326  BP:  104/64 126/77 113/80  Pulse: (!) 48 (!) 56 63   Resp: (!) 21 19 20     Temp:  99.9 F (37.7 C) 99 F (37.2 C)   TempSrc:  Oral    SpO2: 96% 97% 100%   Weight:      Height:        General exam: Young male, looks older than stated age, moderately built and frail lying comfortably propped up in bed without distress. Respiratory system: Clear to auscultation.  No increased work of breathing. Cardiovascular system: S1 and S2 heard, regular bradycardia.  No JVD or murmurs.  Telemetry personally reviewed: Sinus bradycardia in the 40s-50s. Gastrointestinal system: Abdomen is nondistended, soft and nontender. No organomegaly or masses felt. Normal bowel sounds heard. GU: Bilateral PCNs draining straw clear urine in right and minimally bloodstained urine in the left.  Also has indwelling Foley catheter which she had prior to admission. Central nervous system: Alert and oriented x2. No focal neurological deficits. Extremities: Symmetric 5 x 5 power. Skin: Multiple tattoos.  Transgluteal JP drain with bulb showing minimal brown fluid. Psychiatry: Judgement and insight appear somewhat impaired. Mood & affect somewhat irritable Neck: Right IJ HD catheter.    Data Reviewed:   I have personally reviewed following labs and imaging studies   CBC: Recent Labs  Lab 03/31/20 1420 04/01/20 0213 04/04/20 2054 04/05/20 0158 04/06/20 0352  WBC 16.6*   < > 10.2 9.1 7.9  NEUTROABS 15.1*  --  8.2*  --   --   HGB 8.3*   < > 8.3* 8.2* 8.3*  HCT 24.7*   < > 23.7* 24.0* 24.3*  MCV 92.2   < > 88.8 88.6 90.0  PLT 262   < > 166 175 168   < > = values in this interval not displayed.    Basic Metabolic Panel: Recent Labs  Lab 04/01/20 0213 04/01/20 0841 04/02/20 0312 04/03/20 0401 04/04/20 0447 04/05/20 0158 04/06/20 0352  NA 141   < > 142   < > 136 134* 133*  K 4.2   < > 3.0*   < > 3.4* 3.9 4.4  CL 108   < > 102   < > 101 102 101  CO2 <7*   < > 18*   < > 23 23 21*  GLUCOSE 88   < > 75   < > 98 89 87  BUN 134*   < > 59*   < > 28* 33* 39*  CREATININE 15.49*   <  > 7.58*   < > 4.22* 4.43* 4.74*  CALCIUM 7.3*   < > 6.8*   < > 7.0* 7.0* 6.8*  MG  2.2  --   --   --   --   --   --   PHOS >30.0*  --  4.2  --   --   --   --    < > = values in this interval not displayed.    Liver Function Tests: Recent Labs  Lab 03/31/20 1420  AST 38  ALT 25  ALKPHOS 146*  BILITOT 0.9  PROT 8.0  ALBUMIN 3.4*    CBG: Recent Labs  Lab 04/05/20 1626 04/05/20 2147 04/06/20 1227  GLUCAP 100* 91 88    Microbiology Studies:   Recent Results (from the past 240 hour(s))  Urine culture     Status: Abnormal   Collection Time: 03/31/20  2:07 PM   Specimen: In/Out Cath Urine  Result Value Ref Range Status   Specimen Description   Final    IN/OUT CATH URINE Performed at [facility], [address], [place], [place] [address]    Special Requests   Final    NONE Performed at [facility], [address], [place], [place] [address]    [place], SUGGEST RECOLLECTION (A)  Final   Report Status 04/02/2020 FINAL  Final  Blood Culture (routine x 2)     Status: None   Collection Time: 03/31/20  2:20 PM   Specimen: BLOOD  Result Value Ref Range Status   Specimen Description BLOOD BLOOD LEFT FOREARM  Final   Special Requests   Final    BOTTLES DRAWN AEROBIC AND ANAEROBIC Blood Culture adequate volume   Culture   Final    NO GROWTH 5 DAYS Performed at [facility], [address], [place], [place] [address]    Report Status 04/05/2020 FINAL  Final  Blood Culture (routine x 2)     Status: None   Collection Time: 03/31/20  2:20 PM   Specimen: BLOOD  Result Value Ref Range Status   Specimen Description BLOOD LEFT ANTECUBITAL  Final   Special Requests   Final    BOTTLES DRAWN AEROBIC AND ANAEROBIC Blood Culture adequate volume   Culture   Final    NO GROWTH 5 DAYS Performed at [facility], [address], [place], [place] [address]    Report Status 04/05/2020 FINAL  Final  Resp Panel by RT-PCR (Flu A&B, Covid) Nasopharyngeal Swab      Status: None   Collection Time: 03/31/20  2:48 PM   Specimen: Nasopharyngeal Swab; Nasopharyngeal(NP) swabs in vial transport medium  Result Value Ref Range Status   SARS Coronavirus 2 by RT PCR NEGATIVE NEGATIVE Final    Comment: (NOTE) SARS-CoV-2 target nucleic acids are NOT DETECTED.  The SARS-CoV-2 RNA is generally detectable in upper respiratory specimens during the acute phase of infection. The lowest concentration of SARS-CoV-2 viral copies this assay can detect is 138 copies/mL. A negative result does not preclude SARS-Cov-2 infection and should not be used as the sole basis for treatment or other patient management decisions. A negative result may occur with  improper specimen collection/handling, submission of specimen other than nasopharyngeal swab, presence of viral mutation(s) within the areas targeted by this assay, and inadequate number of viral copies(<138 copies/mL). A negative result must be combined with clinical observations, patient history, and epidemiological information. The expected result is Negative.  Fact Sheet for Patients:  EntrepreneurPulse.com.au  Fact Sheet for Healthcare Providers:  IncredibleEmployment.be  This test is no t yet approved or cleared by the [place] and  has been authorized  for detection and/or diagnosis of SARS-CoV-2 by FDA under an Emergency Use Authorization (EUA). This EUA will remain  in effect (meaning this test can be used) for the duration of the COVID-19 declaration under Section 564(b)(1) of the Act, 21 U.S.C.section 360bbb-3(b)(1), unless the authorization is terminated  or revoked sooner.       Influenza A by PCR NEGATIVE NEGATIVE Final   Influenza B by PCR NEGATIVE NEGATIVE Final    Comment: (NOTE) The Xpert Xpress SARS-CoV-2/FLU/RSV plus assay is intended as an aid in the diagnosis of influenza from Nasopharyngeal swab specimens and should not be used as a sole basis  for treatment. Nasal washings and aspirates are unacceptable for Xpert Xpress SARS-CoV-2/FLU/RSV testing.  Fact Sheet for Patients: EntrepreneurPulse.com.au  Fact Sheet for Healthcare Providers: IncredibleEmployment.be  This test is not yet approved or cleared by the [place] FDA and has been authorized for detection and/or diagnosis of SARS-CoV-2 by FDA under an Emergency Use Authorization (EUA). This EUA will remain in effect (meaning this test can be used) for the duration of the COVID-19 declaration under Section 564(b)(1) of the Act, 21 U.S.C. section 360bbb-3(b)(1), unless the authorization is terminated or revoked.  Performed at [facility], [address], [place], [place] [address]   Urine Culture     Status: Abnormal   Collection Time: 03/31/20 10:27 PM   Specimen: In/Out Cath Urine  Result Value Ref Range Status   Specimen Description IN/OUT CATH URINE  Final   Special Requests   Final    NONE Performed at [facility], [place] [address] [place], [place] [address]    Culture MULTIPLE SPECIES PRESENT, SUGGEST RECOLLECTION (A)  Final   Report Status 04/02/2020 FINAL  Final  Urine culture     Status: Abnormal   Collection Time: 04/01/20  1:57 PM   Specimen: Urine, Random  Result Value Ref Range Status   Specimen Description URINE, RANDOM  Final   Special Requests   Final    LEFT KIDNEY Performed at [facility], [place] [address], [place], [place] [address]    Culture MULTIPLE SPECIES PRESENT, SUGGEST RECOLLECTION (A)  Final   Report Status 04/02/2020 FINAL  Final     Radiology Studies:  No results found.   Scheduled Meds:   . calcium carbonate  1 tablet Oral TID  . Chlorhexidine Gluconate Cloth  6 each Topical Daily  . DULoxetine  60 mg Oral Daily   And  . DULoxetine  30 mg Oral QHS  . heparin  5,000 Units Subcutaneous Q8H  . multivitamin with minerals  1 tablet Oral BID  . pantoprazole  40 mg Oral QHS   . sevelamer carbonate  800 mg Oral TID WC  . sodium bicarbonate  650 mg Oral BID  . sodium chloride flush  5 mL Intracatheter Q8H  . tamsulosin  0.4 mg Oral QHS    Continuous Infusions:   . ceFEPime (MAXIPIME) IV 1 g (04/05/20 1843)  . lactated ringers with kcl 100 mL/hr at 04/05/20 1844     LOS: 6 days     Vernell Leep, MD, [place], [facility]. [facility]    To contact the attending provider between 7A-7P or the covering provider during after hours 7P-7A, please log into the web site [email or web address] and access using universal [place] password for that web site. If you do not have the password, please call the hospital operator.  04/06/2020, 1:31 PM

## 2020-04-07 ENCOUNTER — Other Ambulatory Visit (HOSPITAL_COMMUNITY): Payer: No Typology Code available for payment source

## 2020-04-07 DIAGNOSIS — N170 Acute kidney failure with tubular necrosis: Secondary | ICD-10-CM | POA: Diagnosis not present

## 2020-04-07 DIAGNOSIS — N185 Chronic kidney disease, stage 5: Secondary | ICD-10-CM | POA: Diagnosis not present

## 2020-04-07 LAB — GLUCOSE, CAPILLARY
Glucose-Capillary: 78 mg/dL (ref 70–99)
Glucose-Capillary: 83 mg/dL (ref 70–99)
Glucose-Capillary: 84 mg/dL (ref 70–99)
Glucose-Capillary: 86 mg/dL (ref 70–99)
Glucose-Capillary: 88 mg/dL (ref 70–99)
Glucose-Capillary: 89 mg/dL (ref 70–99)
Glucose-Capillary: 92 mg/dL (ref 70–99)
Glucose-Capillary: 98 mg/dL (ref 70–99)

## 2020-04-07 LAB — BASIC METABOLIC PANEL
Anion gap: 11 (ref 5–15)
BUN: 41 mg/dL — ABNORMAL HIGH (ref 6–20)
CO2: 20 mmol/L — ABNORMAL LOW (ref 22–32)
Calcium: 6.9 mg/dL — ABNORMAL LOW (ref 8.9–10.3)
Chloride: 102 mmol/L (ref 98–111)
Creatinine, Ser: 4.86 mg/dL — ABNORMAL HIGH (ref 0.61–1.24)
GFR, Estimated: 13 mL/min — ABNORMAL LOW (ref >60)
Glucose, Bld: 111 mg/dL — ABNORMAL HIGH (ref 70–99)
Potassium: 4.1 mmol/L (ref 3.5–5.1)
Sodium: 133 mmol/L — ABNORMAL LOW (ref 135–145)

## 2020-04-07 NOTE — Progress Notes (Signed)
Patient ID: Chase Picker., male   DOB: [birth year], 55 y.o.   MRN: [id number]  This NP visited patient at the bedside as a follow up to  yesterday's [place].  He is alert and oriented and verbalizes appreciation for yesterday family meeting.  He would like to keep the lines of communication open with his sons.  He continues to struggle with limited care options.  Ultimately he wants to be at home but has little support.  All family live out of town.  I expressed my concern with his serious medical situation/co-morbidites and the importance of  ACP.  Education offered to  patient regarding  the importance of continued conversation with his family and the  medical providers regarding overall plan of care and treatment options,  ensuring decisions are within the context of the patients values and GOCs.  Will attempt another family call sometime tomorrow hoping to further clarify GOCs and treatment plan.  Questions and concerns addressed     Total time spent on the unit was 25 minutes  Greater than 50% of the time was spent in counseling and coordination of care  Chase Lessen NP  Palliative Medicine Team Team Phone # [phone] Pager [phone]

## 2020-04-07 NOTE — Progress Notes (Signed)
Patient refused foley care. Patient alert and oriented. Patient educated and verbalized understanding. States I will let you know when I want it. Will continue to monitor.

## 2020-04-07 NOTE — TOC Progression Note (Addendum)
Transition of Care (TOC) - Progression Note    Patient Details  Name: Chase KALTENBACH Sr. MRN: [id number] Date of Birth: 09-09-1965  Transition of Care [facility]) CM/SW Contact  Zenon Mayo, RN Phone Number: 04/07/2020, 5:00 PM  Clinical Narrative:    Per MD patient wants to go home at discharge, he is a [place] patient, Dr Doyle Askew is his PCP at the [facility] , fax number is [phone].  He has no support at home, he lives alone.  Patient is refusing SNF, he has neprosotomy tubes also.  NCM spoke with the [place] to see if could get auth today, Marquita states Cyndee Brightly is the CSW assigned to this patient. Patient will not be able to get auth until Monday when [place] is open, MD's are gone for the day today.  MD notified of this information. Patient will need HHRN, HHPT HHAIDE , rolling waller and bsc.  Patient will need transport , he can barely walk. NCM received call from Dr. Doyle Askew with the [facility], she states patient is active with [facility] and has authorization good til May 22nd.  NCM faxed the orders to her.  NCM notified, MD of this information, patient also states he has not made a decision of what he wants to do yet with his life.         Expected Discharge Plan and Services                                                 Social Determinants of Health (SDOH) Interventions    Readmission Risk Interventions Readmission Risk Prevention Plan 12/25/2019 11/19/2019  Transportation Screening Complete Complete  Medication Review Press photographer) Complete Complete  PCP or Specialist appointment within 3-5 days of discharge - Complete  HRI or [place] - Complete  SW Recovery Care/Counseling Consult - Complete  [place] - Complete  Some recent data might be hidden

## 2020-04-07 NOTE — Progress Notes (Signed)
[facility] Progress Note    Assessment/ Plan:   Assessment/Recommendations: Chase LEFKOWITZ Sr. is a/an 55 y.o. male with a past medical history significant for gastric bypass, hodgkin's lymphoma, colovesical fistula, bilateral ureteral stenosis w/ ckd and hydronephrosiswho present w/ acute renal failure, sepsis, and hydronephrosis  Non-OliguricAKI on CKD V 2/2 urinary obstruction and dehydration w/ severe electrolyte derrangements.CKD secondary to longstanding obstruction. AKI likely dehydration with obstruction contributing also with sepsis likely leading to ATN. Severe electrolyte derangements with severe acidosis and likely uremia.  - HD #1 1/121, HD #2 04/04/19 --> he is making fairly good UOP  -Can stop LR given nearing DC -Appreciate IR help with temporary HD catheter and nephrostomy tubes -HD catheter removed -Patient states still undecided about HD but no plans for now. His BL Crt is 5-6 so his recent rise is expected and he would then plateau within 5-6. I discussed with son that he is a poor candidate for long term dialysis and they will plan to have ongoing discussion. He will need to be seen in our clinic for discussions to continue  Bilateral hydronephrosis: History of such presenting with removal of his nephrostomy tubes and hydronephrosis. PCN in place working well   Sepsis: Likely urosepsis. Now significantly improved.  Severe metabolic acidosis: improved w/ dialysis and improved renal function. Cont oral sodium bicarb  Anemia: Hgb 8.3 yesterday. Consider ESA outpatient   Dispo: weak and not eating- he has a FTT picture on top of all this- I think his prognosis isn't good even if he is able to come off dialysis and I appreciate pall care involvement. He is okay to DC from my perspective f/u with [place] in outpatient setting   Subjective:    Overall no changes. No further decisions about possible dialysis in the future.    Objective:   BP (!)  94/54 (BP Location: Left Arm)   Pulse 69   Temp 98.2 F (36.8 C) (Axillary)   Resp 20   Ht 5\' 6"  (1.676 m)   Wt 57.9 kg   SpO2 96%   BMI 20.60 kg/m   Intake/Output Summary (Last 24 hours) at 04/07/2020 1057 Last data filed at 04/07/2020 2836 Gross per 24 hour  Intake 1226.03 ml  Output 2425 ml  Net -1198.97 ml   Weight change: -1.2 kg  Physical Exam: Gen: NAD, lying in bed, frail and pale CVS: RRR Resp: bilateral chest rise, no iwob Abd: soft GU: bilateral PCN tubes in place, draining yellow urine Ext: no LE edema ACCESS: R IJ nontunneled HD cath  Imaging: No results found.  Labs: BMET Recent Labs  Lab 04/01/20 0213 04/01/20 0841 04/02/20 0312 04/03/20 0401 04/04/20 0447 04/05/20 0158 04/06/20 0352 04/07/20 0115  NA 141 145 142 143 136 134* 133* 133*  K 4.2 3.0* 3.0* 3.0* 3.4* 3.9 4.4 4.1  CL 108 106 102 104 101 102 101 102  CO2 <7* 9* 18* 17* 23 23 21* 20*  GLUCOSE 88 76 75 86 98 89 87 111*  BUN 134* 133* 59* 69* 28* 33* 39* 41*  CREATININE 15.49* 15.34* 7.58* 8.39* 4.22* 4.43* 4.74* 4.86*  CALCIUM 7.3* 6.6* 6.8* 7.3* 7.0* 7.0* 6.8* 6.9*  PHOS >30.0*  --  4.2  --   --   --   --   --    CBC Recent Labs  Lab 03/31/20 1420 04/01/20 0213 04/04/20 0447 04/04/20 2054 04/05/20 0158 04/06/20 0352  WBC 16.6*   < > 10.3 10.2 9.1 7.9  NEUTROABS 15.1*  --   --  8.2*  --   --   HGB 8.3*   < > 7.0* 8.3* 8.2* 8.3*  HCT 24.7*   < > 20.7* 23.7* 24.0* 24.3*  MCV 92.2   < > 87.7 88.8 88.6 90.0  PLT 262   < > 159 166 175 168   < > = values in this interval not displayed.    Medications:    . calcium carbonate  1 tablet Oral TID  . Chlorhexidine Gluconate Cloth  6 each Topical Daily  . DULoxetine  60 mg Oral Daily   And  . DULoxetine  30 mg Oral QHS  . heparin  5,000 Units Subcutaneous Q8H  . multivitamin with minerals  1 tablet Oral BID  . pantoprazole  40 mg Oral QHS  . sevelamer carbonate  800 mg Oral TID WC  . sodium bicarbonate  650 mg Oral BID  .  sodium chloride flush  5 mL Intracatheter Q8H  . tamsulosin  0.4 mg Oral QHS     Reesa Chew  04/07/2020, 10:57 AM

## 2020-04-07 NOTE — Progress Notes (Signed)
PT Cancellation Note  Patient Details Name: Chase [name]. MRN: [id number] DOB: 08/31/1965   Cancelled Treatment:    Reason Eval/Treat Not Completed: Other (comment)   Received new order to pt, pt with no significant changes.  Spoke with pt who declined PT today due to pain/fatigue.  He did report he prefers not to go back to rehab at discharge.  Based on evaluation and pt living alone, continue to recommend SNF ; however, did increase frequency to minimum 3xweek in case pt refuses SNF placement.  Abran Richard, PT Acute Rehab Services Pager [phone] [facility] 04/07/2020, 3:06 PM

## 2020-04-07 NOTE — Progress Notes (Signed)
Pt's pain is managed with Norco. His appetite is inadequate but oral intake is fair.  Pt's nephro tubes are patent and draining without any problems. Pt did refuse his CHG bath and some of medications as well. Pt's needs are met and safety measures are maintained. Pt was a possible discharge today but due to not getting his needs arranged through the [place]; Pt will be leaving possibly on Monday.

## 2020-04-07 NOTE — Progress Notes (Signed)
Patient did not come to clinic appointment today in anticipation of discussion of sigmoid colectomy and takedown of chronic colovesical fistula.  Patient has had habit of no showing or being very difficult to get a hold of with family far away.  Looked into the system to find that he had been admitted (!)  Sounds like his CKD & kidney failure is worsening despite percutaneous nephrostomy tubes as well as stenting.  On intermittent dialysis at this point.  Dr. Alinda Money with urology aware as well.  Will need clearance from medicine and nephrology before considering surgery.  Do not know if a diverting colostomy would be of help in this situation to divert feces away from the bladder in the hopes that could simplify things.  He is already diverted with percutaneous nephrostomy tubes.Marland Kitchen  He really does not seem to have had strong evidence of any bowel obstruction.  A challenging situation.  Palliative care is involved.  Dr. Alinda Money & I are planning to do surgery on him in 2 weeks but I worry that he is in no shape for this.  We will table any surgical intervention at this time unless the patient wishes to be aggressive and is cleared.  Adin Hector, MD, FACS, MASCRS Gastrointestinal and Minimally Invasive Surgery  [facility] [address], [place], [place] [address] [phone] Fax [phone] Main/Paging  CONTACT INFORMATION: Weekday (9AM-5PM) concerns: Call [facility] main office at [phone] Weeknight (5PM-9AM) or Weekend/Holiday concerns: Check [email or web address] for General Surgery [facility] coverage (Please, do not use SecureChat as it is not reliable communication to operating surgeons for immediate patient care)

## 2020-04-07 NOTE — Progress Notes (Signed)
PROGRESS NOTE   Chase [name]  XTG:[id number]    DOB: 24-Jun-1965    DOA: 03/31/2020  PCP: [name]   I have briefly reviewed patients previous medical records in [facility].  Chief Complaint  Patient presents with  . 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  Creatinine was 16.69 (in October 2021: Creatinine 5.76).  He was admitted for acute renal failure complicating stage V CKD secondary to urinary obstruction and dehydration with severe electrolyte derangements.  Nephrology was consulted.  Patient underwent hemodialysis.  IR replaced bilateral percutaneous nephrostomy tubes.  His creatinine improved with a couple of cycles of HD, creatinine plateaued, HD catheter removed, creatinine gradually creeping up but still below his baseline per nephrology.  No plans for further HD.  Nephrology has cleared him for discharge and will arrange outpatient follow-up.  At this time patient is medically optimized for discharge.  He is extremely weak and deconditioned and not sure if he can even stand up by himself.  He reportedly lives alone.  As per discussion with his son, there is no family that lives nearby that could assist him.  He is extremely unsafe to DC home and at risk for falls, injuries and multiple other complications including death.  This has been explained to him multiple times but he consistently refuses discharge to SNF for rehab.  He says he has been there and does not want to go back.  He has capacity to make medical decisions but believe is making poor judgment.  At  current time, the [place] is unable to arrange his home health services and DME until Monday and hence as per TOC advise, discharge will be held until Monday.  Family also trying to scramble some plan to assist him in the interim.   Assessment & Plan:  Principal Problem:   Sepsis with acute renal failure without septic shock (HCC) Active Problems:   Chronic pain   Colovesical fistula   Acute renal failure superimposed on stage 5 chronic kidney disease, not on chronic dialysis ([place])   History of gastric bypass   Hodgkin lymphoma, unspecified, unspecified site (HCC)   Metabolic acidosis   Toxic metabolic encephalopathy   PTSD (post-traumatic stress disorder)   Bilateral ureteral obstruction s/p perc nephrosotmy & stenting   History of CVA (cerebrovascular accident)   DNR (do not resuscitate) discussion   Palliative care by specialist   Adult failure to thrive   Acute renal failure complicating stage V chronic kidney disease:  Nephrology consulted and assisted with evaluation and management.  Creatinine 5.76 in October 2021.  Presented with creatinine of 16.69.  CKD likely secondary to longstanding obstruction.  AKI due to dehydration, sepsis leading to ATN on the backdrop of obstruction.  Had severe electrolyte derangements with severe acidosis and likely uremia on presentation.  Treating with IV fluids.  Bilateral percutaneous nephrostomy tubes were replaced by IR on 04/01/2020.  Temporary HD catheter placed by IR and patient underwent 2 cycles of hemodialysis thus far. Last HD was on 1/3  Nonoliguric.  Creatinine plateaued for a couple days.  HD catheter removed 1/6.  Creatinine slowly  creeping up but still below his previous baseline.  Nephrology does not think that he is a long-term hemodialysis candidate.  Moreover patient himself is unsure about wanting HD and is still contemplating about this.  I discussed with Dr. Osborne Casco on 1/7.  Cleared from their perspective to discharge and  they will arrange outpatient follow-up if he keeps the appointment.  They have discontinued IV fluids and Renvela.  He would continue bicarb at discharge.  Since discharge held until 1/10, will trend BMPs while he is here.  Bilateral hydronephrosis  Patient does not recollect that his nephrostomy tubes were out on admission.  He insists that they were not out.  Not sure how long they were out.  As per IR follow-up on 1/6: Patient had urinary obstruction secondary to pelvic mass with subsequent bilateral hydronephrosis in the setting of bilateral ureteral stents, with bilateral PCNs inadvertently removed (unknown how long they have been out), s/p bilateral PCN placement same IR 04/01/2020.  IR was consulted and placed back bilateral PCNs.  Outpatient follow-up with IR and Urology.  Patient has a chronic Foley as well.  Severe metabolic acidosis:  Bicarbonate <7 on admission.  Lactate was normal.  Due to acute renal failure  Resolved.  As per nephrology, continue sodium bicarbonate.  Hypokalemia  Replaced.  Follow BMP.  Hyperphosphatemia  Was >30.  Resolved.  Renvela discontinued by nephrology.  Severe sepsis secondary to UTI, POA  Encephalopathy can hypothermic on admission with leukocytosis.  Multiple urine culture samples from 12/31 and 1/1 show multiple species.  Blood cultures x2 from 12/31: Negative, final report.  CT renal stone study from 12/31: Bladder wall appeared slightly thickened.  No significant fluid collection in the pelvis.  Completed 7 days course of IV cefepime.  Sepsis resolved  Anemia in CKD  S/p 2 units PRBCs  Hemoglobin stable in the 8 g range.  Acute metabolic/uremic encephalopathy  His encephalopathy was likely multifactorial due to uremia, infection and medications (Cymbalta-resumed, oxycodone and Neurontin).  Mental status has improved and current mental status may well be his baseline.  As per detailed discussion with his son,  patient's overall condition has taken a downward turn since his spouse passed away 6 years ago.  History of pelvic abscess 11/15/2019, s/p left transgluteal drain by IR/colovesical fistula  He has a transgluteal JP drain for his abscess with no significant fluid collection seen in the pelvis on CT on admission.  He is planned for colonoscopy and surgery later in January 2021.  As per my communication with [facility], he set up for colonoscopy with Dr. Rush Landmark next week prior to surgery.  Outpatient follow-up with [facility] and surgery  Asymptomatic sinus bradycardia  Better in the last 24 hours with heart rates ranging in the 50s-SR.  Asymptomatic.    Avoid rate limiting medications  TSH 1.382.  Chronic pain  Judicious and avoid opioids unless absolutely needed.  History of CVA  Ischemic CVA in July 2021, history of A. fib not candidate for anticoagulation due to GI bleed and thrombocytopenia.  No focal deficits.  PTSD  Holding sedatives.  History of gastric bypass  Continue PPI  History of Hodgkin's lymphoma, s/p chemotherapy  Noted.  Outpatient follow-up.  Goals of care  Palliative care follow-up 1/6 appreciated: Full code, refuses to discuss any decisions regarding plan of care.  Recommend palliative care follow-up as outpatient  Body mass index is 20.6 kg/m.  Nutritional Status Nutrition Problem: Severe Malnutrition Etiology: chronic illness (colovesical fistula) Signs/Symptoms:  moderate fat depletion,severe fat depletion,moderate muscle depletion,severe muscle depletion Interventions: MVI,Refer to RD note for recommendations   Physical deconditioning  PT evaluation appreciated.  Recommend SNF placement.  He declined to work with PT today due to pain/fatigue.  To multiple providers, he has refused SNF.    DVT prophylaxis: heparin injection 5,000 Units Start: 04/01/20 2200     Code Status: Full Code Family Communication: I discussed in detail  with patient's son via phone on 1/7, updated care and answered questions.  Please see discussion above Disposition:  Status is: Inpatient  Remains inpatient appropriate because:Inpatient level of care appropriate due to severity of illness   Dispo: The patient is from: SNF               Anticipated d/c is to: Patient declines SNF.  Insists on going home.              Anticipated d/c date is: 04/11/2019              Patient currently is medically optimized for DC but unable to discharge until 04/11/2019 pending home health services and DME arrangement by [facility] with the [place].  Also family trying to assist with his DC home.        Consultants:   Nephrology Interventional radiology Palliative care medicine  Procedures:   As noted above  Antimicrobials:    Anti-infectives (From admission, onward)   Start     Dose/Rate Route Frequency Ordered Stop   04/03/20 1600  ceFEPIme (MAXIPIME) 1 g in sodium chloride 0.9 % 100 mL IVPB        1 g 200 mL/hr over 30 Minutes Intravenous Every 24 hours 04/03/20 0918 04/07/20 1108   04/01/20 1600  ceFEPIme (MAXIPIME) 1 g in sodium chloride 0.9 % 100 mL IVPB  Status:  Discontinued        1 g 200 mL/hr over 30 Minutes Intravenous Every 24 hours 03/31/20 1550 04/03/20 0918   04/01/20 1145  cefTRIAXone (ROCEPHIN) 1 g in sodium chloride 0.9 % 100 mL IVPB        1 g 200 mL/hr over 30 Minutes Intravenous  Once 04/01/20 1057 04/01/20 1308   03/31/20 1415  ceFEPIme (MAXIPIME) 2 g in sodium chloride 0.9 % 100 mL IVPB        2 g 200 mL/hr over 30 Minutes Intravenous  Once 03/31/20 1407 03/31/20 1516        Subjective:  Not seeing much this morning.  Still unsure about long-term hemodialysis.  Also states that he does not want to go to SNF and has been to SNF in the past.  Objective:   Vitals:   04/07/20 0450 04/07/20 0845 04/07/20 1151 04/07/20 1247  BP: 103/60 (!) 94/54 119/72 120/66  Pulse: 63 69 (!) 55 (!) 50  Resp: 18 20 19 15   Temp: 99.3 F (37.4  C) 98.2 F (36.8 C) (!) 97.5 F (36.4 C) 98.2 F (36.8 C)  TempSrc: Oral Axillary Oral Oral  SpO2: 96% 96% 97% 98%  Weight:      Height:        General exam: Young male, looks older than stated age, moderately built and frail lying comfortably propped up in bed without distress. Respiratory system: Clear to auscultation.  No increased work of breathing. Cardiovascular system: S1 and S2 heard, RRR.  No JVD, murmurs or pedal edema.  Telemetry personally reviewed: SB in the 50s-SR. Gastrointestinal system: Abdomen is nondistended, soft and nontender. No organomegaly or  masses felt. Normal bowel sounds heard. GU: Bilateral PCNs draining straw clear urine in right and minimally bloodstained urine in the left.  Also has indwelling Foley catheter which she had prior to admission. Central nervous system: Alert and oriented x2. No focal neurological deficits. Extremities: Symmetric 5 x 5 power. Skin: Multiple tattoos.  Transgluteal JP drain with bulb showing minimal brown fluid. Psychiatry: Judgement and insight appear somewhat impaired. Mood & affect flat and mostly irritable Neck: Right IJ HD catheter removed.    Data Reviewed:   I have personally reviewed following labs and imaging studies   CBC: Recent Labs  Lab 04/04/20 2054 04/05/20 0158 04/06/20 0352  WBC 10.2 9.1 7.9  NEUTROABS 8.2*  --   --   HGB 8.3* 8.2* 8.3*  HCT 23.7* 24.0* 24.3*  MCV 88.8 88.6 90.0  PLT 166 175 106    Basic Metabolic Panel: Recent Labs  Lab 04/01/20 0213 04/01/20 0841 04/02/20 0312 04/03/20 0401 04/05/20 0158 04/06/20 0352 04/07/20 0115  NA 141   < > 142   < > 134* 133* 133*  K 4.2   < > 3.0*   < > 3.9 4.4 4.1  CL 108   < > 102   < > 102 101 102  CO2 <7*   < > 18*   < > 23 21* 20*  GLUCOSE 88   < > 75   < > 89 87 111*  BUN 134*   < > 59*   < > 33* 39* 41*  CREATININE 15.49*   < > 7.58*   < > 4.43* 4.74* 4.86*  CALCIUM 7.3*   < > 6.8*   < > 7.0* 6.8* 6.9*  MG 2.2  --   --   --   --   --    --   PHOS >30.0*  --  4.2  --   --   --   --    < > = values in this interval not displayed.    Liver Function Tests: No results for input(s): AST, ALT, ALKPHOS, BILITOT, PROT, ALBUMIN in the last 168 hours.  CBG: Recent Labs  Lab 04/07/20 0725 04/07/20 1243 04/07/20 1624  GLUCAP 78 98 92    Microbiology Studies:   Recent Results (from the past 240 hour(s))  Urine culture     Status: Abnormal   Collection Time: 03/31/20  2:07 PM   Specimen: In/Out Cath Urine  Result Value Ref Range Status   Specimen Description   Final    IN/OUT CATH URINE Performed at [facility], [address] [place], [place] [address]    Special Requests   Final    NONE Performed at [facility], [address], [place], [place] [address]    Culture MULTIPLE SPECIES PRESENT, SUGGEST RECOLLECTION (A)  Final   Report Status 04/02/2020 FINAL  Final  Blood Culture (routine x 2)     Status: None   Collection Time: 03/31/20  2:20 PM   Specimen: BLOOD  Result Value Ref Range Status   Specimen Description BLOOD BLOOD LEFT FOREARM  Final   Special Requests   Final    BOTTLES DRAWN AEROBIC AND ANAEROBIC Blood Culture adequate volume   Culture   Final    NO GROWTH 5 DAYS Performed at [facility], [address] [place], [place] [address]    Report Status 04/05/2020 FINAL  Final  Blood Culture (routine x 2)     Status: None   Collection Time: 03/31/20  2:20 PM   Specimen: BLOOD  Result Value Ref Range Status   Specimen Description BLOOD LEFT ANTECUBITAL  Final   Special Requests   Final    BOTTLES DRAWN AEROBIC AND ANAEROBIC Blood Culture adequate volume   Culture   Final    NO GROWTH 5 DAYS Performed at [facility], [address] [place], [place] [address]    Report Status 04/05/2020 FINAL  Final  Resp Panel by RT-PCR (Flu A&B, Covid) Nasopharyngeal Swab     Status: None   Collection Time: 03/31/20  2:48 PM   Specimen: Nasopharyngeal Swab; Nasopharyngeal(NP) swabs in vial transport medium   Result Value Ref Range Status   SARS Coronavirus 2 by RT PCR NEGATIVE NEGATIVE Final    Comment: (NOTE) SARS-CoV-2 target nucleic acids are NOT DETECTED.  The SARS-CoV-2 RNA is generally detectable in upper respiratory specimens during the acute phase of infection. The lowest concentration of SARS-CoV-2 viral copies this assay can detect is 138 copies/mL. A negative result does not preclude SARS-Cov-2 infection and should not be used as the sole basis for treatment or other patient management decisions. A negative result may occur with  improper specimen collection/handling, submission of specimen other than nasopharyngeal swab, presence of viral mutation(s) within the areas targeted by this assay, and inadequate number of viral copies(<138 copies/mL). A negative result must be combined with clinical observations, patient history, and epidemiological information. The expected result is Negative.  Fact Sheet for Patients:  EntrepreneurPulse.com.au  Fact Sheet for Healthcare Providers:  IncredibleEmployment.be  This test is no t yet approved or cleared by the [place] FDA and  has been authorized for detection and/or diagnosis of SARS-CoV-2 by FDA under an Emergency Use Authorization (EUA). This EUA will remain  in effect (meaning this test can be used) for the duration of the COVID-19 declaration under Section 564(b)(1) of the Act, 21 U.S.C.section 360bbb-3(b)(1), unless the authorization is terminated  or revoked sooner.       Influenza A by PCR NEGATIVE NEGATIVE Final   Influenza B by PCR NEGATIVE NEGATIVE Final    Comment: (NOTE) The Xpert Xpress SARS-CoV-2/FLU/RSV plus assay is intended as an aid in the diagnosis of influenza from Nasopharyngeal swab specimens and should not be used as a sole basis for treatment. Nasal washings and aspirates are unacceptable for Xpert Xpress SARS-CoV-2/FLU/RSV testing.  Fact Sheet for  Patients: EntrepreneurPulse.com.au  Fact Sheet for Healthcare Providers: IncredibleEmployment.be  This test is not yet approved or cleared by the [place] FDA and has been authorized for detection and/or diagnosis of SARS-CoV-2 by FDA under an Emergency Use Authorization (EUA). This EUA will remain in effect (meaning this test can be used) for the duration of the COVID-19 declaration under Section 564(b)(1) of the Act, 21 U.S.C. section 360bbb-3(b)(1), unless the authorization is terminated or revoked.  Performed at [facility], [address], [place], [place] [address]   Urine Culture     Status: Abnormal   Collection Time: 03/31/20 10:27 PM   Specimen: In/Out Cath Urine  Result Value Ref Range Status   Specimen Description IN/OUT CATH URINE  Final   Special Requests   Final    NONE Performed at [facility], [place] [address] [place], [place] [address]    Culture MULTIPLE SPECIES PRESENT, SUGGEST RECOLLECTION (A)  Final   Report Status 04/02/2020 FINAL  Final  Urine culture     Status: Abnormal   Collection Time: 04/01/20  1:57 PM   Specimen: Urine,  Random  Result Value Ref Range Status   Specimen Description URINE, RANDOM  Final   Special Requests   Final    LEFT KIDNEY Performed at [facility], [place] [address] [place], [place] [address]    Culture MULTIPLE SPECIES PRESENT, SUGGEST RECOLLECTION (A)  Final   Report Status 04/02/2020 FINAL  Final     Radiology Studies:  No results found.   Scheduled Meds:   . calcium carbonate  1 tablet Oral TID  . Chlorhexidine Gluconate Cloth  6 each Topical Daily  . DULoxetine  60 mg Oral Daily   And  . DULoxetine  30 mg Oral QHS  . heparin  5,000 Units Subcutaneous Q8H  . multivitamin with minerals  1 tablet Oral BID  . pantoprazole  40 mg Oral QHS  . sodium bicarbonate  650 mg Oral BID  . sodium chloride flush  5 mL Intracatheter Q8H  . tamsulosin  0.4 mg Oral QHS     Continuous Infusions:      LOS: 7 days     Vernell Leep, MD, [place], [facility]. [facility]    To contact the attending provider between 7A-7P or the covering provider during after hours 7P-7A, please log into the web site [email or web address] and access using universal [place] password for that web site. If you do not have the password, please call the hospital operator.  04/07/2020, 4:38 PM

## 2020-04-08 DIAGNOSIS — F4310 Post-traumatic stress disorder, unspecified: Secondary | ICD-10-CM

## 2020-04-08 DIAGNOSIS — R627 Adult failure to thrive: Secondary | ICD-10-CM | POA: Diagnosis not present

## 2020-04-08 DIAGNOSIS — Z9884 Bariatric surgery status: Secondary | ICD-10-CM

## 2020-04-08 DIAGNOSIS — N321 Vesicointestinal fistula: Secondary | ICD-10-CM

## 2020-04-08 DIAGNOSIS — G894 Chronic pain syndrome: Secondary | ICD-10-CM

## 2020-04-08 DIAGNOSIS — N179 Acute kidney failure, unspecified: Secondary | ICD-10-CM | POA: Diagnosis not present

## 2020-04-08 DIAGNOSIS — N135 Crossing vessel and stricture of ureter without hydronephrosis: Secondary | ICD-10-CM | POA: Diagnosis not present

## 2020-04-08 DIAGNOSIS — A419 Sepsis, unspecified organism: Secondary | ICD-10-CM | POA: Diagnosis not present

## 2020-04-08 LAB — BASIC METABOLIC PANEL
Anion gap: 10 (ref 5–15)
BUN: 39 mg/dL — ABNORMAL HIGH (ref 6–20)
CO2: 20 mmol/L — ABNORMAL LOW (ref 22–32)
Calcium: 7.1 mg/dL — ABNORMAL LOW (ref 8.9–10.3)
Chloride: 105 mmol/L (ref 98–111)
Creatinine, Ser: 4.99 mg/dL — ABNORMAL HIGH (ref 0.61–1.24)
GFR, Estimated: 13 mL/min — ABNORMAL LOW (ref >60)
Glucose, Bld: 86 mg/dL (ref 70–99)
Potassium: 4.6 mmol/L (ref 3.5–5.1)
Sodium: 135 mmol/L (ref 135–145)

## 2020-04-08 LAB — GLUCOSE, CAPILLARY
Glucose-Capillary: 83 mg/dL (ref 70–99)
Glucose-Capillary: 89 mg/dL (ref 70–99)

## 2020-04-08 MED ORDER — ENSURE ENLIVE PO LIQD: 237.0000 mL | Freq: Two times a day (BID) | ORAL | Status: DC

## 2020-04-08 NOTE — Progress Notes (Signed)
PROGRESS NOTE    Chase [name]  HEN:[id number] DOB: 02/28/1966 DOA: 03/31/2020 PCP: [name]   Brief Narrative:  HPI on 03/31/2020 by Dr. Christia Reading Opyd Chase Saxon Sr. is a 55 y.o. 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  Patient was reportedly seen by neighbors yesterday in a recliner and was then found on the floor today obtunded.  He was sent to [facility] emergency department and is unable to provide history due to his clinical condition.  Interim history Admitted with acute renal failure on chronic kidney disease, stage V secondary to urinary obstruction and dehydration with electrolyte abnormalities.  Nephrology consulted.  Patient did undergo hemodialysis.  IR placed bilateral percutaneous nephrostomy tubes.  Creatinine did improve with a couple cycles of hemodialysis however plateaued.  Hemodialysis catheter was then removed.  No further plans for hemodialysis at this time.  Nephrology feels that patient can be discharged home with outpatient follow-up.  At this time, discharge is complicated by patient being extremely weak and deconditioned.  He lives alone and insist on going home.  TOC attempting to arrange home health services. Assessment & Plan   Acute renal failure on chronic kidney disease, stage V -Nephrology consulted and appreciated -Creatinine 5.76 in October 2021, although patient presented with a creatinine of 16.69 -Chronic kidney disease likely secondary  to longstanding obstruction.  Acute kidney injury due to dehydration and sepsis along with ATN with obstruction. -Patient also noted to have severe electrolyte derangements with severe acidosis and likely uremia on presentation. -Was placed on IV fluids -Interventional radiology consulted and appreciated for bilateral percutaneous nephrostomy tube placement, 04/01/2020 as well as temporary HD catheter placement. -Patient did undergo hemodialysis, 2 cycles with last hemodialysis on 04/04/2019. -Creatinine has now plateaued, currently 4.99 -Though creatinine is still elevated, it is below his baseline.  Patient himself is unsure about wanting continuous HD.  Nephrology recommended that patient follow-up with nephrology as an outpatient.  Recommended continuing bicarb on discharge.  Bilateral hydronephrosis -Per IR follow-up on 04/07/2019, patient had urinary obstruction secondary to pelvic mass with subsequent bilateral hydronephrosis in the setting of bilateral ureteral stents, bilateral PCNs inadvertently removed (unknown how long they have been out), status post bilateral PCN placement on 04/01/2020. -Patient will need to follow-up with interventional radiology as well as urology -He also has a chronic Foley catheter as well.  Severe metabolic acidosis -Bicarb was less than 7 on admission, lactate was normal -Suspect secondary to renal failure -Resolving -Patient will need to be discharged with sodium bicarbonate  Hypokalemia -Resolved, continue to monitor and replace as needed  Hyperphosphatemia -Was >30 on admission -Has resolved -Renvela discontinued by nephrology  Severe sepsis secondary to urinary tract infection -Present on admission -Patient presented with encephalopathy as well as hypothermia and leukocytosis -Blood cultures were negative x2 -Urine culture from 12/31 and 1/1 showed multiple species -CT renal stone study 12/31: Bladder wall appeared slightly thickened.  No significant  fluid collection in pelvis -Patient completed 7 days of IV cefepime -Sepsis has resolved  Anemia of chronic disease -Patient did receive  2 units PRBC -Last hemoglobin 8.3, continue to monitor intermittently  Acute metabolic/uremic encephalopathy -Encephalopathy likely multifactorial including uremia, infection -Mental status has improved and patient does appear to be at baseline  History of pelvic abscess -Noted on 11/15/2019, status post left transgluteal drain by IR/colovesicular fistula -Plan for colonoscopy and surgery later this month -Patient supposed to follow-up with gastroenterology, Dr. Rush Landmark  Asymptomatic sinus bradycardia -Avoid rate limiting medications -TSH 1.382  Chronic pain -Judicious and avoid opiates unless absolutely needed  History of CVA -July 2021, history of atrial fibrillation, patient is not a candidate for anticoagulation due to GI bleed and thrombocytopenia -Currently no focal deficits  PTSD -Sedatives held  History of gastric bypass   History of Hodgkin's lymphoma -Status post chemotherapy -Continue to follow-up outpatient  Goals of care -Palliative care consulted and appreciated -Patient full code and refused to discuss any decisions regarding plan of care -Patient should follow up with palliative care as an outpatient  Severe malnutrition -In the setting of chronic illness -Continue supplements  Physical deconditioning -PT evaluated and recommended SNF placement however patient has declined.  He is also declined to work with PT further due to pain and fatigue -He has refused SNF placement would like to return home -TOC consulted and trying to arrange home health services   DVT Prophylaxis Heparin  Code Status: Full  Family Communication: None at bedside  Disposition Plan:  Status is: Inpatient  Remains inpatient appropriate because:Unsafe d/c plan.  Patient is medically optimized and stable at this point however unable to  discharge until 04/10/2020 given his physical deconditioning and need for home health services which have not been arranged yet.  TOC trying to arrange through the [place].   Dispo: The patient is from: Home              Anticipated d/c is to: Home              Anticipated d/c date is: 2 days              Patient currently is medically stable to d/c.   Consultants Nephrology Interventional radiology Palliative care  Procedures  As above  Antibiotics   Anti-infectives (From admission, onward)   Start     Dose/Rate Route Frequency Ordered Stop   04/03/20 1600  ceFEPIme (MAXIPIME) 1 g in sodium chloride 0.9 % 100 mL IVPB        1 g 200 mL/hr over 30 Minutes Intravenous Every 24 hours 04/03/20 0918 04/07/20 1108   04/01/20 1600  ceFEPIme (MAXIPIME) 1 g in sodium chloride 0.9 % 100 mL IVPB  Status:  Discontinued        1 g 200 mL/hr over 30 Minutes Intravenous Every 24 hours 03/31/20 1550 04/03/20 0918   04/01/20 1145  cefTRIAXone (ROCEPHIN) 1 g in sodium chloride 0.9 % 100 mL IVPB        1 g 200 mL/hr over 30 Minutes Intravenous  Once 04/01/20 1057 04/01/20 1308   03/31/20 1415  ceFEPIme (MAXIPIME) 2 g in sodium chloride 0.9 % 100 mL IVPB        2 g 200 mL/hr over 30 Minutes Intravenous  Once 03/31/20 1407 03/31/20 1516      Subjective:   Chase [name] seen and examined today.  Patient not very interactive this morning wishes to be left alone.  Currently covered with his blanket and refuses to be examined.  Objective:   Vitals:   04/07/20 1805 04/07/20 2035 04/08/20 0000 04/08/20  0408  BP: 114/65 118/70 121/75 107/69  Pulse: 62 63  (!) 57  Resp: 19 16 16 14   Temp: 97.7 F (36.5 C) (!) 97.1 F (36.2 C)  98.2 F (36.8 C)  TempSrc: Oral Oral  Oral  SpO2: 100% 96% 96% 96%  Weight:    59.6 kg  Height:        Intake/Output Summary (Last 24 hours) at 04/08/2020 1150 Last data filed at 04/08/2020 0422 Gross per 24 hour  Intake 370 ml  Output 2025 ml  Net -1655 ml   Filed  Weights   04/07/20 0029 04/07/20 0145 04/08/20 0408  Weight: 57.9 kg 57.9 kg 59.6 kg    Exam  Patient refused examination today.   Data Reviewed: I have personally reviewed following labs and imaging studies  CBC: Recent Labs  Lab 04/03/20 0401 04/04/20 0447 04/04/20 2054 04/05/20 0158 04/06/20 0352  WBC 12.5* 10.3 10.2 9.1 7.9  NEUTROABS  --   --  8.2*  --   --   HGB 7.1* 7.0* 8.3* 8.2* 8.3*  HCT 20.4* 20.7* 23.7* 24.0* 24.3*  MCV 85.7 87.7 88.8 88.6 90.0  PLT 180 159 166 175 295   Basic Metabolic Panel: Recent Labs  Lab 04/02/20 0312 04/03/20 0401 04/04/20 0447 04/05/20 0158 04/06/20 0352 04/07/20 0115 04/08/20 0422  NA 142   < > 136 134* 133* 133* 135  K 3.0*   < > 3.4* 3.9 4.4 4.1 4.6  CL 102   < > 101 102 101 102 105  CO2 18*   < > 23 23 21* 20* 20*  GLUCOSE 75   < > 98 89 87 111* 86  BUN 59*   < > 28* 33* 39* 41* 39*  CREATININE 7.58*   < > 4.22* 4.43* 4.74* 4.86* 4.99*  CALCIUM 6.8*   < > 7.0* 7.0* 6.8* 6.9* 7.1*  PHOS 4.2  --   --   --   --   --   --    < > = values in this interval not displayed.   GFR: Estimated Creatinine Clearance: 13.9 mL/min (A) (by C-G formula based on SCr of 4.99 mg/dL (H)). Liver Function Tests: No results for input(s): AST, ALT, ALKPHOS, BILITOT, PROT, ALBUMIN in the last 168 hours. No results for input(s): LIPASE, AMYLASE in the last 168 hours. No results for input(s): AMMONIA in the last 168 hours. Coagulation Profile: No results for input(s): INR, PROTIME in the last 168 hours. Cardiac Enzymes: No results for input(s): CKTOTAL, CKMB, CKMBINDEX, TROPONINI in the last 168 hours. BNP (last 3 results) No results for input(s): PROBNP in the last 8760 hours. HbA1C: No results for input(s): HGBA1C in the last 72 hours. CBG: Recent Labs  Lab 04/07/20 1243 04/07/20 1624 04/07/20 2151 04/07/20 2355 04/08/20 0411  GLUCAP 98 92 89 86 83   Lipid Profile: No results for input(s): CHOL, HDL, LDLCALC, TRIG, CHOLHDL,  LDLDIRECT in the last 72 hours. Thyroid Function Tests: No results for input(s): TSH, T4TOTAL, FREET4, T3FREE, THYROIDAB in the last 72 hours. Anemia Panel: No results for input(s): VITAMINB12, FOLATE, FERRITIN, TIBC, IRON, RETICCTPCT in the last 72 hours. Urine analysis:    Component Value Date/Time   COLORURINE YELLOW 03/31/2020 1407   APPEARANCEUR CLOUDY (A) 03/31/2020 1407   LABSPEC 1.013 03/31/2020 1407   PHURINE 5.0 03/31/2020 1407   GLUCOSEU NEGATIVE 03/31/2020 1407   HGBUR MODERATE (A) 03/31/2020 1407   BILIRUBINUR NEGATIVE 03/31/2020 1407   KETONESUR 5 (A) 03/31/2020 1407  PROTEINUR >=300 (A) 03/31/2020 1407   NITRITE NEGATIVE 03/31/2020 1407   LEUKOCYTESUR LARGE (A) 03/31/2020 1407   Sepsis Labs: @LABRCNTIP (procalcitonin:4,lacticidven:4)  ) Recent Results (from the past 240 hour(s))  Urine culture     Status: Abnormal   Collection Time: 03/31/20  2:07 PM   Specimen: In/Out Cath Urine  Result Value Ref Range Status   Specimen Description   Final    IN/OUT CATH URINE Performed at [facility], [address], [place], [place] [address]    Special Requests   Final    NONE Performed at [facility], [address] [place], [place] [address]    [place], SUGGEST RECOLLECTION (A)  Final   Report Status 04/02/2020 FINAL  Final  Blood Culture (routine x 2)     Status: None   Collection Time: 03/31/20  2:20 PM   Specimen: BLOOD  Result Value Ref Range Status   Specimen Description BLOOD BLOOD LEFT FOREARM  Final   Special Requests   Final    BOTTLES DRAWN AEROBIC AND ANAEROBIC Blood Culture adequate volume   Culture   Final    NO GROWTH 5 DAYS Performed at [facility], [address], [place], [place] [address]    Report Status 04/05/2020 FINAL  Final  Blood Culture (routine x 2)     Status: None   Collection Time: 03/31/20  2:20 PM   Specimen: BLOOD  Result Value Ref Range Status   Specimen Description BLOOD LEFT ANTECUBITAL  Final    Special Requests   Final    BOTTLES DRAWN AEROBIC AND ANAEROBIC Blood Culture adequate volume   Culture   Final    NO GROWTH 5 DAYS Performed at [facility], [address] [place], [place] [address]    Report Status 04/05/2020 FINAL  Final  Resp Panel by RT-PCR (Flu A&B, Covid) Nasopharyngeal Swab     Status: None   Collection Time: 03/31/20  2:48 PM   Specimen: Nasopharyngeal Swab; Nasopharyngeal(NP) swabs in vial transport medium  Result Value Ref Range Status   SARS Coronavirus 2 by RT PCR NEGATIVE NEGATIVE Final    Comment: (NOTE) SARS-CoV-2 target nucleic acids are NOT DETECTED.  The SARS-CoV-2 RNA is generally detectable in upper respiratory specimens during the acute phase of infection. The lowest concentration of SARS-CoV-2 viral copies this assay can detect is 138 copies/mL. A negative result does not preclude SARS-Cov-2 infection and should not be used as the sole basis for treatment or other patient management decisions. A negative result may occur with  improper specimen collection/handling, submission of specimen other than nasopharyngeal swab, presence of viral mutation(s) within the areas targeted by this assay, and inadequate number of viral copies(<138 copies/mL). A negative result must be combined with clinical observations, patient history, and epidemiological information. The expected result is Negative.  Fact Sheet for Patients:  EntrepreneurPulse.com.au  Fact Sheet for Healthcare Providers:  IncredibleEmployment.be  This test is no t yet approved or cleared by the [place] FDA and  has been authorized for detection and/or diagnosis of SARS-CoV-2 by FDA under an Emergency Use Authorization (EUA). This EUA will remain  in effect (meaning this test can be used) for the duration of the COVID-19 declaration under Section 564(b)(1) of the Act, 21 U.S.C.section 360bbb-3(b)(1), unless the authorization is terminated  or  revoked sooner.       Influenza A by PCR NEGATIVE NEGATIVE Final   Influenza B by PCR NEGATIVE NEGATIVE Final    Comment: (NOTE)  The Xpert Xpress SARS-CoV-2/FLU/RSV plus assay is intended as an aid in the diagnosis of influenza from Nasopharyngeal swab specimens and should not be used as a sole basis for treatment. Nasal washings and aspirates are unacceptable for Xpert Xpress SARS-CoV-2/FLU/RSV testing.  Fact Sheet for Patients: EntrepreneurPulse.com.au  Fact Sheet for Healthcare Providers: IncredibleEmployment.be  This test is not yet approved or cleared by the [place] FDA and has been authorized for detection and/or diagnosis of SARS-CoV-2 by FDA under an Emergency Use Authorization (EUA). This EUA will remain in effect (meaning this test can be used) for the duration of the COVID-19 declaration under Section 564(b)(1) of the Act, 21 U.S.C. section 360bbb-3(b)(1), unless the authorization is terminated or revoked.  Performed at [facility], [address], [place], [place] [address]   Urine Culture     Status: Abnormal   Collection Time: 03/31/20 10:27 PM   Specimen: In/Out Cath Urine  Result Value Ref Range Status   Specimen Description IN/OUT CATH URINE  Final   Special Requests   Final    NONE Performed at [facility], [place] [address] [place], [place] [address]    Culture MULTIPLE SPECIES PRESENT, SUGGEST RECOLLECTION (A)  Final   Report Status 04/02/2020 FINAL  Final  Urine culture     Status: Abnormal   Collection Time: 04/01/20  1:57 PM   Specimen: Urine, Random  Result Value Ref Range Status   Specimen Description URINE, RANDOM  Final   Special Requests   Final    LEFT KIDNEY Performed at [facility], [place] [address] [place], [place] [address]    Culture MULTIPLE SPECIES PRESENT, SUGGEST RECOLLECTION (A)  Final   Report Status 04/02/2020 FINAL  Final      Radiology Studies: No results  found.   Scheduled Meds: . calcium carbonate  1 tablet Oral TID  . Chlorhexidine Gluconate Cloth  6 each Topical Daily  . DULoxetine  60 mg Oral Daily   And  . DULoxetine  30 mg Oral QHS  . feeding supplement  237 mL Oral BID BM  . heparin  5,000 Units Subcutaneous Q8H  . multivitamin with minerals  1 tablet Oral BID  . pantoprazole  40 mg Oral QHS  . sodium bicarbonate  650 mg Oral BID  . sodium chloride flush  5 mL Intracatheter Q8H  . tamsulosin  0.4 mg Oral QHS   Continuous Infusions:   LOS: 8 days   Time Spent in minutes   45 minutes  Elvera Almario D.O. on 04/08/2020 at 11:50 AM  Between 7am to 7pm - Please see pager noted on [email or web address]  After 7pm go to [email or web address]  And look for the night coverage person covering for me after hours  [facility] Office  [phone]

## 2020-04-08 NOTE — Progress Notes (Signed)
[facility] Progress Note    Assessment/ Plan:   Assessment/Recommendations: Chase [name] Sr. is a/an 55 y.o. male with a past medical history significant for gastric bypass, hodgkin's lymphoma, colovesical fistula, bilateral ureteral stenosis w/ ckd and hydronephrosiswho present w/ acute renal failure, sepsis, and hydronephrosis  Non-OliguricAKI on CKD V 2/2 urinary obstruction and dehydration w/ severe electrolyte derrangements.CKD secondary to longstanding obstruction. BL Crt around 5-6. AKI likely dehydration with and obstruction. S/p HD x2 now holding further treatments -Nephrostomy tubes in place -Patient states still undecided about HD but no plans for now. His BL Crt is 5-6 so his recent rise is expected and he would then plateau within 5-6. I discussed with son that he is a poor candidate for long term dialysis and they will plan to have ongoing discussion. He will need to be seen in our clinic for discussions to continue; we will set up f/u  Bilateral hydronephrosis: History of such presenting with removal of his nephrostomy tubes and hydronephrosis. PCN in place working well   Sepsis: Likely urosepsis. Now significantly improved.  Severe metabolic acidosis: improved w/ dialysis and improved renal function. Cont oral sodium bicarb  Anemia of CKD: Consider ESA outpatient   Dispo: weak and not eating- he has a FTT picture on top of all this- I think his prognosis isn't good even if he is able to come off dialysis and I appreciate pall care involvement. He is okay to DC from my perspective f/u with [place] in outpatient setting   Subjective:    No changes today. Denies any symptoms. Waiting to go home   Objective:   BP 107/69 (BP Location: Left Arm)   Pulse (!) 57   Temp 98.2 F (36.8 C) (Oral)   Resp 14   Ht 5\' 6"  (1.676 m)   Wt 59.6 kg   SpO2 96%   BMI 21.21 kg/m   Intake/Output Summary (Last 24 hours) at 04/08/2020 1009 Last data filed at  04/08/2020 0422 Gross per 24 hour  Intake 370 ml  Output 2025 ml  Net -1655 ml   Weight change: 1.7 kg  Physical Exam: Gen: NAD, lying in bed, frail and pale CVS: RRR Resp: bilateral chest rise, no iwob Abd: soft GU: bilateral PCN tubes in place, draining yellow urine Ext: no LE edema ACCESS: R IJ nontunneled HD cath  Imaging: No results found.  Labs: BMET Recent Labs  Lab 04/02/20 0312 04/03/20 0401 04/04/20 0447 04/05/20 0158 04/06/20 0352 04/07/20 0115 04/08/20 0422  NA 142 143 136 134* 133* 133* 135  K 3.0* 3.0* 3.4* 3.9 4.4 4.1 4.6  CL 102 104 101 102 101 102 105  CO2 18* 17* 23 23 21* 20* 20*  GLUCOSE 75 86 98 89 87 111* 86  BUN 59* 69* 28* 33* 39* 41* 39*  CREATININE 7.58* 8.39* 4.22* 4.43* 4.74* 4.86* 4.99*  CALCIUM 6.8* 7.3* 7.0* 7.0* 6.8* 6.9* 7.1*  PHOS 4.2  --   --   --   --   --   --    CBC Recent Labs  Lab 04/04/20 0447 04/04/20 2054 04/05/20 0158 04/06/20 0352  WBC 10.3 10.2 9.1 7.9  NEUTROABS  --  8.2*  --   --   HGB 7.0* 8.3* 8.2* 8.3*  HCT 20.7* 23.7* 24.0* 24.3*  MCV 87.7 88.8 88.6 90.0  PLT 159 166 175 168    Medications:    . calcium carbonate  1 tablet Oral TID  . Chlorhexidine Gluconate Cloth  6 each Topical Daily  . DULoxetine  60 mg Oral Daily   And  . DULoxetine  30 mg Oral QHS  . feeding supplement  237 mL Oral BID BM  . heparin  5,000 Units Subcutaneous Q8H  . multivitamin with minerals  1 tablet Oral BID  . pantoprazole  40 mg Oral QHS  . sodium bicarbonate  650 mg Oral BID  . sodium chloride flush  5 mL Intracatheter Q8H  . tamsulosin  0.4 mg Oral QHS     Reesa Chew  04/08/2020, 10:09 AM

## 2020-04-09 DIAGNOSIS — R627 Adult failure to thrive: Secondary | ICD-10-CM | POA: Diagnosis not present

## 2020-04-09 DIAGNOSIS — N135 Crossing vessel and stricture of ureter without hydronephrosis: Secondary | ICD-10-CM | POA: Diagnosis not present

## 2020-04-09 DIAGNOSIS — A419 Sepsis, unspecified organism: Secondary | ICD-10-CM | POA: Diagnosis not present

## 2020-04-09 DIAGNOSIS — N179 Acute kidney failure, unspecified: Secondary | ICD-10-CM | POA: Diagnosis not present

## 2020-04-09 LAB — BASIC METABOLIC PANEL
Anion gap: 11 (ref 5–15)
BUN: 41 mg/dL — ABNORMAL HIGH (ref 6–20)
CO2: 19 mmol/L — ABNORMAL LOW (ref 22–32)
Calcium: 7.3 mg/dL — ABNORMAL LOW (ref 8.9–10.3)
Chloride: 105 mmol/L (ref 98–111)
Creatinine, Ser: 5.09 mg/dL — ABNORMAL HIGH (ref 0.61–1.24)
GFR, Estimated: 13 mL/min — ABNORMAL LOW (ref >60)
Glucose, Bld: 98 mg/dL (ref 70–99)
Potassium: 4.4 mmol/L (ref 3.5–5.1)
Sodium: 135 mmol/L (ref 135–145)

## 2020-04-09 LAB — GLUCOSE, CAPILLARY
Glucose-Capillary: 142 mg/dL — ABNORMAL HIGH (ref 70–99)
Glucose-Capillary: 81 mg/dL (ref 70–99)
Glucose-Capillary: 86 mg/dL (ref 70–99)
Glucose-Capillary: 86 mg/dL (ref 70–99)
Glucose-Capillary: 91 mg/dL (ref 70–99)

## 2020-04-09 MED ORDER — OXYCODONE-ACETAMINOPHEN 5-325 MG PO TABS
1.0000 | ORAL_TABLET | ORAL | Status: DC | PRN
  Administered 2020-04-09: 1 via ORAL
  Administered 2020-04-09: 2 via ORAL
  Administered 2020-04-09: 1 via ORAL
  Administered 2020-04-10 – 2020-04-12 (×14): 2 via ORAL
  Administered 2020-04-12: 1 via ORAL
  Administered 2020-04-13 – 2020-04-15 (×14): 2 via ORAL
  Administered 2020-04-15 (×2): 1 via ORAL
  Administered 2020-04-16 – 2020-04-19 (×19): 2 via ORAL
  Filled 2020-04-09 (×20): qty 2
  Filled 2020-04-09: qty 1
  Filled 2020-04-09 (×23): qty 2
  Filled 2020-04-09: qty 1
  Filled 2020-04-09 (×8): qty 2

## 2020-04-09 NOTE — Progress Notes (Signed)
[facility] Progress Note    Assessment/ Plan:   Assessment/Recommendations: Chase STREEPER Sr. is a/an 55 y.o. male with a past medical history significant for gastric bypass, hodgkin's lymphoma, colovesical fistula, bilateral ureteral stenosis w/ ckd and hydronephrosiswho present w/ acute renal failure, sepsis, and hydronephrosis  Non-OliguricAKI on CKD V 2/2 urinary obstruction and dehydration w/ severe electrolyte derrangements.CKD secondary to longstanding obstruction. BL Crt around 5-6. AKI likely dehydration with and obstruction. S/p HD x2 now holding further treatments -Nephrostomy tubes in place -Patient undecided about HD but no plans for now. His BL Crt is 5-6 so his recent rise is expected and he would then plateau within 5-6. I discussed with son that he is a poor candidate for long term dialysis and they will plan to have ongoing discussion. He will need to be seen in our clinic for discussions to continue; we will set up f/u  Bilateral hydronephrosis: History of such presenting with removal of his nephrostomy tubes and hydronephrosis. PCN in place working well   Sepsis: Likely urosepsis. Now significantly improved.  Severe metabolic acidosis: improved w/ dialysis and improved renal function. Cont oral sodium bicarb  Anemia of CKD: Consider ESA outpatient   Dispo: weak and not eating- he has a FTT picture on top of all this- I think his prognosis isn't good even if he is able to come off dialysis and I appreciate pall care involvement. He is okay to DC from my perspective f/u with [place] in outpatient setting. We are setting this up   Subjective:    No significant changes. Crt slightly higher today reaching his baseline.   Objective:   BP 112/67   Pulse 65   Temp 98.5 F (36.9 C) (Oral)   Resp 16   Ht 5\' 6"  (1.676 m)   Wt 59.6 kg   SpO2 96%   BMI 21.21 kg/m   Intake/Output Summary (Last 24 hours) at 04/09/2020 0710 Last data filed at  04/09/2020 0600 Gross per 24 hour  Intake 730 ml  Output 2275 ml  Net -1545 ml   Weight change: 0 kg  Physical Exam: Gen: NAD, lying in bed, frail and pale CVS: RRR Resp: bilateral chest rise, no iwob Abd: soft GU: bilateral PCN tubes in place, draining yellow urine Ext: no LE edema ACCESS: R IJ nontunneled HD cath  Imaging: No results found.  Labs: BMET Recent Labs  Lab 04/03/20 0401 04/04/20 0447 04/05/20 0158 04/06/20 0352 04/07/20 0115 04/08/20 0422 04/09/20 0250  NA 143 136 134* 133* 133* 135 135  K 3.0* 3.4* 3.9 4.4 4.1 4.6 4.4  CL 104 101 102 101 102 105 105  CO2 17* 23 23 21* 20* 20* 19*  GLUCOSE 86 98 89 87 111* 86 98  BUN 69* 28* 33* 39* 41* 39* 41*  CREATININE 8.39* 4.22* 4.43* 4.74* 4.86* 4.99* 5.09*  CALCIUM 7.3* 7.0* 7.0* 6.8* 6.9* 7.1* 7.3*   CBC Recent Labs  Lab 04/04/20 0447 04/04/20 2054 04/05/20 0158 04/06/20 0352  WBC 10.3 10.2 9.1 7.9  NEUTROABS  --  8.2*  --   --   HGB 7.0* 8.3* 8.2* 8.3*  HCT 20.7* 23.7* 24.0* 24.3*  MCV 87.7 88.8 88.6 90.0  PLT 159 166 175 168    Medications:    . calcium carbonate  1 tablet Oral TID  . Chlorhexidine Gluconate Cloth  6 each Topical Daily  . DULoxetine  60 mg Oral Daily   And  . DULoxetine  30 mg Oral  QHS  . feeding supplement  237 mL Oral BID BM  . heparin  5,000 Units Subcutaneous Q8H  . multivitamin with minerals  1 tablet Oral BID  . pantoprazole  40 mg Oral QHS  . sodium bicarbonate  650 mg Oral BID  . sodium chloride flush  5 mL Intracatheter Q8H  . tamsulosin  0.4 mg Oral QHS     Chase [name]  04/09/2020, 7:10 AM

## 2020-04-09 NOTE — Progress Notes (Signed)
PROGRESS NOTE    Chase [name]  VXB:[id number] DOB: [birth year] DOA: 03/31/2020 PCP: [name]   Brief Narrative:  HPI on 03/31/2020 by Dr. Christia Reading Opyd [name] is a 55 y.o. 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  Patient was reportedly seen by neighbors yesterday in a recliner and was then found on the floor today obtunded.  He was sent to [facility] emergency department and is unable to provide history due to his clinical condition.  Interim history Admitted with acute renal failure on chronic kidney disease, stage V secondary to urinary obstruction and dehydration with electrolyte abnormalities.  Nephrology consulted.  Patient did undergo hemodialysis.  IR placed bilateral percutaneous nephrostomy tubes.  Creatinine did improve with a couple cycles of hemodialysis however plateaued.  Hemodialysis catheter was then removed.  No further plans for hemodialysis at this time.  Nephrology feels that patient can be discharged home with outpatient follow-up.  At this time, discharge is complicated by patient being extremely weak and deconditioned.  He lives alone and insist on going home.  TOC attempting to arrange home health services. Assessment & Plan   Acute renal failure on chronic kidney disease, stage V -Nephrology consulted and appreciated -Creatinine 5.76 in October 2021, although patient presented with a creatinine of 16.69 -Chronic kidney disease likely secondary  to longstanding obstruction.  Acute kidney injury due to dehydration and sepsis along with ATN with obstruction. -Patient also noted to have severe electrolyte derangements with severe acidosis and likely uremia on presentation. -Was placed on IV fluids -Interventional radiology consulted and appreciated for bilateral percutaneous nephrostomy tube placement, 04/01/2020 as well as temporary HD catheter placement. -Patient did undergo hemodialysis, 2 cycles with last hemodialysis on 04/04/2019. -Creatinine has now plateaued, currently 5.09 -Though creatinine is still elevated, it is below his baseline.  Patient himself is unsure about wanting continuous HD.  Nephrology recommended that patient follow-up with nephrology as an outpatient.  Recommended continuing bicarb on discharge.  Bilateral hydronephrosis -Per IR follow-up on 04/07/2019, patient had urinary obstruction secondary to pelvic mass with subsequent bilateral hydronephrosis in the setting of bilateral ureteral stents, bilateral PCNs inadvertently removed (unknown how long they have been out), status post bilateral PCN placement on 04/01/2020. -Patient will need to follow-up with interventional radiology as well as urology -He also has a chronic Foley catheter as well.  Severe metabolic acidosis -Bicarb was less than 7 on admission, lactate was normal -Suspect secondary to renal failure -Resolving -Patient will need to be discharged with sodium bicarbonate  Hypokalemia -Resolved, continue to monitor and replace as needed  Hyperphosphatemia -Was >30 on admission -Has resolved -Renvela discontinued by nephrology  Severe sepsis secondary to urinary tract infection -Present on admission -Patient presented with encephalopathy as well as hypothermia and leukocytosis -Blood cultures were negative x2 -Urine culture from 12/31 and 1/1 showed multiple species -CT renal stone study 12/31: Bladder wall appeared slightly thickened.  No significant  fluid collection in pelvis -Patient completed 7 days of IV cefepime -Sepsis has resolved  Anemia of chronic disease -Patient did receive  2 units PRBC -Last hemoglobin 8.3, continue to monitor intermittently  Acute metabolic/uremic encephalopathy -Encephalopathy likely multifactorial including uremia, infection -Mental status has improved and patient does appear to be at baseline  History of pelvic abscess -Noted on 11/15/2019, status post left transgluteal drain by IR/colovesicular fistula -Plan for colonoscopy and surgery later this month -Patient supposed to follow-up with gastroenterology, Dr. Rush Landmark  Asymptomatic sinus bradycardia -Avoid rate limiting medications -TSH 1.382  Chronic pain -Judicious and avoid opiates unless absolutely needed -will discuss adding a muscle relaxant with nephrology that may be renally favorable  History of CVA -July 2021, history of atrial fibrillation, patient is not a candidate for anticoagulation due to GI bleed and thrombocytopenia -Currently no focal deficits  PTSD -Sedatives held  History of gastric bypass   History of Hodgkin's lymphoma -Status post chemotherapy -Continue to follow-up outpatient  Goals of care -Palliative care consulted and appreciated -Patient full code and refused to discuss any decisions regarding plan of care -Patient should follow up with palliative care as an outpatient  Severe malnutrition -In the setting of chronic illness -Continue supplements  Physical deconditioning -PT evaluated and recommended SNF placement however patient has declined.  He is also declined to work with PT further due to pain and fatigue -He has refused SNF placement would like to return home -TOC consulted and trying to arrange home health services   DVT Prophylaxis Heparin  Code Status: Full  Family Communication: None at bedside  Disposition Plan:  Status is: Inpatient  Remains inpatient appropriate  because:Unsafe d/c plan.  Patient is medically optimized and stable at this point however unable to discharge until 04/10/2020 given his physical deconditioning and need for home health services which have not been arranged yet.  TOC trying to arrange through the [place].   Dispo: The patient is from: Home              Anticipated d/c is to: Home              Anticipated d/c date is: 2 days              Patient currently is medically stable to d/c.   Consultants Nephrology Interventional radiology Palliative care  Procedures  As above  Antibiotics   Anti-infectives (From admission, onward)   Start     Dose/Rate Route Frequency Ordered Stop   04/03/20 1600  ceFEPIme (MAXIPIME) 1 g in sodium chloride 0.9 % 100 mL IVPB        1 g 200 mL/hr over 30 Minutes Intravenous Every 24 hours 04/03/20 0918 04/07/20 1108   04/01/20 1600  ceFEPIme (MAXIPIME) 1 g in sodium chloride 0.9 % 100 mL IVPB  Status:  Discontinued        1 g 200 mL/hr over 30 Minutes Intravenous Every 24 hours 03/31/20 1550 04/03/20 0918   04/01/20 1145  cefTRIAXone (ROCEPHIN) 1 g in sodium chloride 0.9 % 100 mL IVPB        1 g 200 mL/hr over 30 Minutes Intravenous  Once 04/01/20 1057 04/01/20 1308   03/31/20 1415  ceFEPIme (MAXIPIME) 2 g in sodium chloride 0.9 % 100 mL IVPB        2 g 200 mL/hr over 30 Minutes Intravenous  Once 03/31/20 1407 03/31/20 1516      Subjective:   [name] seen and examined today.  Patient not very interactive this morning and currently fully covered with his blanket and does not wish to be examined.  States he  is very sore all over.   Objective:   Vitals:   04/08/20 1358 04/08/20 2000 04/09/20 0000 04/09/20 0400  BP: 116/68 120/73 115/74 112/67  Pulse: 82 (!) 55 65   Resp: 18 17 16 16   Temp:  97.7 F (36.5 C)  98.5 F (36.9 C)  TempSrc:  Oral  Oral  SpO2:  98% 98% 96%  Weight:    59.6 kg  Height:        Intake/Output Summary (Last 24 hours) at 04/09/2020 1111 Last data filed at  04/09/2020 1000 Gross per 24 hour  Intake 730 ml  Output 2075 ml  Net -1345 ml   Filed Weights   04/07/20 0145 04/08/20 0408 04/09/20 0400  Weight: 57.9 kg 59.6 kg 59.6 kg    Exam Patient refusing physical exam   Data Reviewed: I have personally reviewed following labs and imaging studies  CBC: Recent Labs  Lab 04/03/20 0401 04/04/20 0447 04/04/20 2054 04/05/20 0158 04/06/20 0352  WBC 12.5* 10.3 10.2 9.1 7.9  NEUTROABS  --   --  8.2*  --   --   HGB 7.1* 7.0* 8.3* 8.2* 8.3*  HCT 20.4* 20.7* 23.7* 24.0* 24.3*  MCV 85.7 87.7 88.8 88.6 90.0  PLT 180 159 166 175 500   Basic Metabolic Panel: Recent Labs  Lab 04/05/20 0158 04/06/20 0352 04/07/20 0115 04/08/20 0422 04/09/20 0250  NA 134* 133* 133* 135 135  K 3.9 4.4 4.1 4.6 4.4  CL 102 101 102 105 105  CO2 23 21* 20* 20* 19*  GLUCOSE 89 87 111* 86 98  BUN 33* 39* 41* 39* 41*  CREATININE 4.43* 4.74* 4.86* 4.99* 5.09*  CALCIUM 7.0* 6.8* 6.9* 7.1* 7.3*   GFR: Estimated Creatinine Clearance: 13.7 mL/min (A) (by C-G formula based on SCr of 5.09 mg/dL (H)). Liver Function Tests: No results for input(s): AST, ALT, ALKPHOS, BILITOT, PROT, ALBUMIN in the last 168 hours. No results for input(s): LIPASE, AMYLASE in the last 168 hours. No results for input(s): AMMONIA in the last 168 hours. Coagulation Profile: No results for input(s): INR, PROTIME in the last 168 hours. Cardiac Enzymes: No results for input(s): CKTOTAL, CKMB, CKMBINDEX, TROPONINI in the last 168 hours. BNP (last 3 results) No results for input(s): PROBNP in the last 8760 hours. HbA1C: No results for input(s): HGBA1C in the last 72 hours. CBG: Recent Labs  Lab 04/07/20 2151 04/07/20 2355 04/08/20 0411 04/08/20 2155 04/09/20 0644  GLUCAP 89 86 83 89 142*   Lipid Profile: No results for input(s): CHOL, HDL, LDLCALC, TRIG, CHOLHDL, LDLDIRECT in the last 72 hours. Thyroid Function Tests: No results for input(s): TSH, T4TOTAL, FREET4, T3FREE,  THYROIDAB in the last 72 hours. Anemia Panel: No results for input(s): VITAMINB12, FOLATE, FERRITIN, TIBC, IRON, RETICCTPCT in the last 72 hours. Urine analysis:    Component Value Date/Time   COLORURINE YELLOW 03/31/2020 1407   APPEARANCEUR CLOUDY (A) 03/31/2020 1407   LABSPEC 1.013 03/31/2020 1407   PHURINE 5.0 03/31/2020 1407   GLUCOSEU NEGATIVE 03/31/2020 1407   HGBUR MODERATE (A) 03/31/2020 1407   BILIRUBINUR NEGATIVE 03/31/2020 1407   KETONESUR 5 (A) 03/31/2020 1407   PROTEINUR >=300 (A) 03/31/2020 1407   NITRITE NEGATIVE 03/31/2020 1407   LEUKOCYTESUR LARGE (A) 03/31/2020 1407   Sepsis Labs: @LABRCNTIP (procalcitonin:4,lacticidven:4)  ) Recent Results (from the past 240 hour(s))  Urine culture     Status: Abnormal   Collection Time: 03/31/20  2:07 PM   Specimen: In/Out Cath Urine  Result Value Ref  Range Status   Specimen Description   Final    IN/OUT CATH URINE Performed at [facility], [address], [place], [place] [address]    Special Requests   Final    NONE Performed at [facility], [address] [place], [place] [address]    Culture MULTIPLE SPECIES PRESENT, SUGGEST RECOLLECTION (A)  Final   Report Status 04/02/2020 FINAL  Final  Blood Culture (routine x 2)     Status: None   Collection Time: 03/31/20  2:20 PM   Specimen: BLOOD  Result Value Ref Range Status   Specimen Description BLOOD BLOOD LEFT FOREARM  Final   Special Requests   Final    BOTTLES DRAWN AEROBIC AND ANAEROBIC Blood Culture adequate volume   Culture   Final    NO GROWTH 5 DAYS Performed at [facility], [address] [place], [place] [address]    Report Status 04/05/2020 FINAL  Final  Blood Culture (routine x 2)     Status: None   Collection Time: 03/31/20  2:20 PM   Specimen: BLOOD  Result Value Ref Range Status   Specimen Description BLOOD LEFT ANTECUBITAL  Final   Special Requests   Final    BOTTLES DRAWN AEROBIC AND ANAEROBIC Blood Culture adequate volume   Culture   Final     NO GROWTH 5 DAYS Performed at [facility], [address], [place],  [address]    Report Status 04/05/2020 FINAL  Final  Resp Panel by RT-PCR (Flu A&B, Covid) Nasopharyngeal Swab     Status: None   Collection Time: 03/31/20  2:48 PM   Specimen: Nasopharyngeal Swab; Nasopharyngeal(NP) swabs in vial transport medium  Result Value Ref Range Status   SARS Coronavirus 2 by RT PCR NEGATIVE NEGATIVE Final    Comment: (NOTE) SARS-CoV-2 target nucleic acids are NOT DETECTED.  The SARS-CoV-2 RNA is generally detectable in upper respiratory specimens during the acute phase of infection. The lowest concentration of SARS-CoV-2 viral copies this assay can detect is 138 copies/mL. A negative result does not preclude SARS-Cov-2 infection and should not be used as the sole basis for treatment or other patient management decisions. A negative result may occur with  improper specimen collection/handling, submission of specimen other than nasopharyngeal swab, presence of viral mutation(s) within the areas targeted by this assay, and inadequate number of viral copies(<138 copies/mL). A negative result must be combined with clinical observations, patient history, and epidemiological information. The expected result is Negative.  Fact Sheet for Patients:  EntrepreneurPulse.com.au  Fact Sheet for Healthcare Providers:  IncredibleEmployment.be  This test is no t yet approved or cleared by the [place] FDA and  has been authorized for detection and/or diagnosis of SARS-CoV-2 by FDA under an Emergency Use Authorization (EUA). This EUA will remain  in effect (meaning this test can be used) for the duration of the COVID-19 declaration under Section 564(b)(1) of the Act, 21 U.S.C.section 360bbb-3(b)(1), unless the authorization is terminated  or revoked sooner.       Influenza A by PCR NEGATIVE NEGATIVE Final   Influenza B by PCR NEGATIVE NEGATIVE Final     Comment: (NOTE) The Xpert Xpress SARS-CoV-2/FLU/RSV plus assay is intended as an aid in the diagnosis of influenza from Nasopharyngeal swab specimens and should not be used as a sole basis for treatment. Nasal washings and aspirates are unacceptable for Xpert Xpress SARS-CoV-2/FLU/RSV testing.  Fact Sheet for Patients: EntrepreneurPulse.com.au  Fact Sheet for Healthcare Providers: IncredibleEmployment.be  This test is  not yet approved or cleared by the [place] and has been authorized for detection and/or diagnosis of SARS-CoV-2 by FDA under an Emergency Use Authorization (EUA). This EUA will remain in effect (meaning this test can be used) for the duration of the COVID-19 declaration under Section 564(b)(1) of the Act, 21 U.S.C. section 360bbb-3(b)(1), unless the authorization is terminated or revoked.  Performed at [facility], [address] [place], [place] [address]   Urine Culture     Status: Abnormal   Collection Time: 03/31/20 10:27 PM   Specimen: In/Out Cath Urine  Result Value Ref Range Status   Specimen Description IN/OUT CATH URINE  Final   Special Requests   Final    NONE Performed at [facility], [place] [address] [place], [place] [address]    Culture MULTIPLE SPECIES PRESENT, SUGGEST RECOLLECTION (A)  Final   Report Status 04/02/2020 FINAL  Final  Urine culture     Status: Abnormal   Collection Time: 04/01/20  1:57 PM   Specimen: Urine, Random  Result Value Ref Range Status   Specimen Description URINE, RANDOM  Final   Special Requests   Final    LEFT KIDNEY Performed at [facility], [place] [address], [place], [place] [address]    Culture MULTIPLE SPECIES PRESENT, SUGGEST RECOLLECTION (A)  Final   Report Status 04/02/2020 FINAL  Final      Radiology Studies: No results found.   Scheduled Meds: . calcium carbonate  1 tablet Oral TID  . Chlorhexidine Gluconate Cloth  6 each Topical Daily  .  DULoxetine  60 mg Oral Daily   And  . DULoxetine  30 mg Oral QHS  . feeding supplement  237 mL Oral BID BM  . heparin  5,000 Units Subcutaneous Q8H  . multivitamin with minerals  1 tablet Oral BID  . pantoprazole  40 mg Oral QHS  . sodium bicarbonate  650 mg Oral BID  . sodium chloride flush  5 mL Intracatheter Q8H  . tamsulosin  0.4 mg Oral QHS   Continuous Infusions:   LOS: 9 days   Time Spent in minutes   30 minutes  Shaheer Bonfield D.O. on 04/09/2020 at 11:11 AM  Between 7am to 7pm - Please see pager noted on [email or web address]  After 7pm go to [email or web address]  And look for the night coverage person covering for me after hours  [facility] Office  [phone]

## 2020-04-09 NOTE — Progress Notes (Signed)
Patient is alert and oriented x 4 and gave RN permission to discuss patient's plan of care with patient's mom and son.

## 2020-04-09 NOTE — Progress Notes (Signed)
Patient has refused part of the shift assessment and some of the medications scheduled through out the day. Family is aware and MD is aware of the patient's flat affect. Patient has allowed RN to drain some of his drains but not all and patient will not have eat regardless of the RN education on the patient's nutrition and healing. Patient has also refused mobility.

## 2020-04-09 NOTE — Progress Notes (Signed)
Provided an update with the patient's son Jermari Tamargo and answered all questions about disposition.

## 2020-04-10 DIAGNOSIS — R627 Adult failure to thrive: Secondary | ICD-10-CM | POA: Diagnosis not present

## 2020-04-10 DIAGNOSIS — N179 Acute kidney failure, unspecified: Secondary | ICD-10-CM | POA: Diagnosis not present

## 2020-04-10 DIAGNOSIS — N135 Crossing vessel and stricture of ureter without hydronephrosis: Secondary | ICD-10-CM | POA: Diagnosis not present

## 2020-04-10 DIAGNOSIS — A419 Sepsis, unspecified organism: Secondary | ICD-10-CM | POA: Diagnosis not present

## 2020-04-10 DIAGNOSIS — N184 Chronic kidney disease, stage 4 (severe): Secondary | ICD-10-CM

## 2020-04-10 LAB — GLUCOSE, CAPILLARY
Glucose-Capillary: 75 mg/dL (ref 70–99)
Glucose-Capillary: 79 mg/dL (ref 70–99)
Glucose-Capillary: 88 mg/dL (ref 70–99)
Glucose-Capillary: 78 mg/dL (ref 70–99)

## 2020-04-10 LAB — BASIC METABOLIC PANEL
Anion gap: 11 (ref 5–15)
BUN: 45 mg/dL — ABNORMAL HIGH (ref 6–20)
CO2: 17 mmol/L — ABNORMAL LOW (ref 22–32)
Calcium: 7.4 mg/dL — ABNORMAL LOW (ref 8.9–10.3)
Chloride: 106 mmol/L (ref 98–111)
Creatinine, Ser: 5.34 mg/dL — ABNORMAL HIGH (ref 0.61–1.24)
GFR, Estimated: 12 mL/min — ABNORMAL LOW (ref >60)
Glucose, Bld: 92 mg/dL (ref 70–99)
Potassium: 5.2 mmol/L — ABNORMAL HIGH (ref 3.5–5.1)
Sodium: 134 mmol/L — ABNORMAL LOW (ref 135–145)

## 2020-04-10 MED ORDER — SODIUM BICARBONATE 650 MG PO TABS
1300.0000 mg | ORAL_TABLET | Freq: Three times a day (TID) | ORAL | Status: DC
  Administered 2020-04-10 – 2020-05-03 (×66): 1300 mg via ORAL
  Filled 2020-04-10 (×66): qty 2

## 2020-04-10 MED ORDER — SODIUM POLYSTYRENE SULFONATE 15 GM/60ML PO SUSP
15.0000 g | Freq: Once | ORAL | Status: AC
  Administered 2020-04-10: 15 g via ORAL
  Filled 2020-04-10: qty 60

## 2020-04-10 MED ORDER — TIZANIDINE HCL 2 MG PO TABS
2.0000 mg | ORAL_TABLET | Freq: Two times a day (BID) | ORAL | Status: DC | PRN
  Administered 2020-04-10 – 2020-04-19 (×14): 2 mg via ORAL
  Filled 2020-04-10 (×14): qty 1

## 2020-04-10 NOTE — Progress Notes (Signed)
PROGRESS NOTE    [facility]  PJK:[id number] DOB: [birth year] DOA: 03/31/2020 PCP: [name]   Brief Narrative:  HPI on 03/31/2020 by Dr. Christia Reading Opyd [name] is a 55 y.o. 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  Patient was reportedly seen by neighbors yesterday in a recliner and was then found on the floor today obtunded.  He was sent to [facility] emergency department and is unable to provide history due to his clinical condition.  Interim history Admitted with acute renal failure on chronic kidney disease, stage V secondary to urinary obstruction and dehydration with electrolyte abnormalities.  Nephrology consulted.  Patient did undergo hemodialysis.  IR placed bilateral percutaneous nephrostomy tubes.  Creatinine did improve with a couple cycles of hemodialysis however plateaued.  Hemodialysis catheter was then removed.  No further plans for hemodialysis at this time.  Nephrology feels that patient can be discharged home with outpatient follow-up.  At this time, discharge is complicated by patient being extremely weak and deconditioned.  Initially patient did not want to be discharged to SNF however this morning he states that he will consider it. Assessment & Plan   Acute renal failure on chronic kidney disease, stage V -Nephrology consulted and appreciated -Creatinine 5.76 in October 2021, although patient presented with a creatinine of 16.69 -Chronic kidney  disease likely secondary to longstanding obstruction.  Acute kidney injury due to dehydration and sepsis along with ATN with obstruction. -Patient also noted to have severe electrolyte derangements with severe acidosis and likely uremia on presentation. -Was placed on IV fluids -Interventional radiology consulted and appreciated for bilateral percutaneous nephrostomy tube placement, 04/01/2020 as well as temporary HD catheter placement. -Patient did undergo hemodialysis, 2 cycles with last hemodialysis on 04/04/2019. -Creatinine has now plateaued, currently 5.34 -Though creatinine is still elevated, it is below his baseline.  Patient himself is unsure about wanting continuous HD.  Nephrology recommended that patient follow-up with nephrology as an outpatient.  Recommended continuing bicarb on discharge.  Bilateral hydronephrosis -Per IR follow-up on 04/07/2019, patient had urinary obstruction secondary to pelvic mass with subsequent bilateral hydronephrosis in the setting of bilateral ureteral stents, bilateral PCNs inadvertently removed (unknown how long they have been out), status post bilateral PCN placement on 04/01/2020. -Patient will need to follow-up with interventional radiology as well as urology -He also has a chronic Foley catheter as well.  Severe metabolic acidosis -Bicarb was less than 7 on admission, lactate was normal -Suspect secondary to renal failure -Resolving -Patient will need to be discharged with sodium bicarbonate  Hypokalemia -Resolved, continue to monitor and replace as needed  Mild hyperkalemia -Potassium 5.2, will order Kayexalate  -continue to monitor  Hyperphosphatemia -Was >30 on admission -Has resolved -Renvela discontinued by nephrology  Severe sepsis secondary to urinary tract infection -Present on admission -Patient presented with encephalopathy as well as hypothermia and leukocytosis -Blood cultures were negative x2 -Urine culture from 12/31 and 1/1  showed multiple species -CT renal stone study 12/31: Bladder wall appeared slightly thickened.  No significant fluid collection in pelvis -Patient completed  7 days of IV cefepime -Sepsis has resolved  Anemia of chronic disease -Patient did receive 2 units PRBC -Last hemoglobin 8.3, continue to monitor intermittently  Acute metabolic/uremic encephalopathy -Encephalopathy likely multifactorial including uremia, infection -Mental status has improved and patient does appear to be at baseline  History of pelvic abscess -Noted on 11/15/2019, status post left transgluteal drain by IR/colovesicular fistula -Plan for colonoscopy and surgery later this month -Patient supposed to follow-up with gastroenterology, Dr. Rush Landmark  Asymptomatic sinus bradycardia -Avoid rate limiting medications -TSH 1.382  Chronic pain -Judicious and avoid opiates unless absolutely needed -Discussed with nephrology, will order low-dose Zanaflex   History of CVA -July 2021, history of atrial fibrillation, patient is not a candidate for anticoagulation due to GI bleed and thrombocytopenia -Currently no focal deficits  PTSD -Sedatives held  History of gastric bypass   History of Hodgkin's lymphoma -Status post chemotherapy -Continue to follow-up outpatient  Goals of care -Palliative care consulted and appreciated -Patient full code and refused to discuss any decisions regarding plan of care -Patient should follow up with palliative care as an outpatient  Severe malnutrition -In the setting of chronic illness -Continue supplements  Physical deconditioning -PT evaluated and recommended SNF placement however patient has declined.  He is also declined to work with PT further due to pain and fatigue -Discussed with patient this morning he initially refused SNF placement and wanted to return home however today he is open to returning to SNF at [place] made aware  DVT Prophylaxis Heparin  Code  Status: Full  Family Communication: None at bedside  Disposition Plan:  Status is: Inpatient  Remains inpatient appropriate because:Unsafe d/c plan.  Patient has been medically optimized and stable however now pending SNF placement.   Dispo: The patient is from: Home              Anticipated d/c is to: SNF              Anticipated d/c date is: 2 days              Patient currently is medically stable to d/c.   Consultants Nephrology Interventional radiology Palliative care  Procedures  As above  Antibiotics   Anti-infectives (From admission, onward)   Start     Dose/Rate Route Frequency Ordered Stop   04/03/20 1600  ceFEPIme (MAXIPIME) 1 g in sodium chloride 0.9 % 100 mL IVPB        1 g 200 mL/hr over 30 Minutes Intravenous Every 24 hours 04/03/20 0918 04/07/20 1108   04/01/20 1600  ceFEPIme (MAXIPIME) 1 g in sodium chloride 0.9 % 100 mL IVPB  Status:  Discontinued        1 g 200 mL/hr over 30 Minutes Intravenous Every 24 hours 03/31/20 1550 04/03/20 0918   04/01/20 1145  cefTRIAXone (ROCEPHIN) 1 g in sodium chloride 0.9 % 100 mL IVPB        1 g 200 mL/hr over 30 Minutes Intravenous  Once 04/01/20 1057 04/01/20 1308   03/31/20 1415  ceFEPIme (MAXIPIME) 2 g in sodium chloride 0.9 % 100 mL IVPB        2 g 200 mL/hr over 30 Minutes Intravenous  Once 03/31/20 1407 03/31/20 1516      Subjective:   [name] seen and examined today.  Patient more interactive this morning continues to complain of pain everywhere.  Is open to going to SNF.  Denies current chest pain or shortness of breath, dizziness or  headache, abdominal pain.    Objective:   Vitals:   04/09/20 0400 04/09/20 2200 04/10/20 0340 04/10/20 0350  BP: 112/67 121/75 113/69   Pulse:   76   Resp: 16 14 14    Temp: 98.5 F (36.9 C) 98.7 F (37.1 C) 98.3 F (36.8 C)   TempSrc: Oral Oral Oral   SpO2: 96% 100% 97%   Weight: 59.6 kg   56.7 kg  Height:        Intake/Output Summary (Last 24 hours) at  04/10/2020 1203 Last data filed at 04/10/2020 0700 Gross per 24 hour  Intake 582 ml  Output 1870 ml  Net -1288 ml   Filed Weights   04/08/20 0408 04/09/20 0400 04/10/20 0350  Weight: 59.6 kg 59.6 kg 56.7 kg   Exam  General: Well developed, cachectic, chronically ill-appearing, NAD  HEENT: NCAT, mucous membranes moist.   Cardiovascular: S1 S2 auscultated, RRR.  Respiratory: Diminished however clear  Abdomen: Soft, nontender, nondistended, + bowel sounds  Extremities: warm dry without cyanosis clubbing or edema  Neuro: AAOx3, nonfocal  Skin: Without rashes exudates or nodules, Multiple tattoos  Psych: flat mood and affect   Data Reviewed: I have personally reviewed following labs and imaging studies  CBC: Recent Labs  Lab 04/04/20 0447 04/04/20 2054 04/05/20 0158 04/06/20 0352  WBC 10.3 10.2 9.1 7.9  NEUTROABS  --  8.2*  --   --   HGB 7.0* 8.3* 8.2* 8.3*  HCT 20.7* 23.7* 24.0* 24.3*  MCV 87.7 88.8 88.6 90.0  PLT 159 166 175 458   Basic Metabolic Panel: Recent Labs  Lab 04/06/20 0352 04/07/20 0115 04/08/20 0422 04/09/20 0250 04/10/20 0321  NA 133* 133* 135 135 134*  K 4.4 4.1 4.6 4.4 5.2*  CL 101 102 105 105 106  CO2 21* 20* 20* 19* 17*  GLUCOSE 87 111* 86 98 92  BUN 39* 41* 39* 41* 45*  CREATININE 4.74* 4.86* 4.99* 5.09* 5.34*  CALCIUM 6.8* 6.9* 7.1* 7.3* 7.4*   GFR: Estimated Creatinine Clearance: 12.4 mL/min (A) (by C-G formula based on SCr of 5.34 mg/dL (H)). Liver Function Tests: No results for input(s): AST, ALT, ALKPHOS, BILITOT, PROT, ALBUMIN in the last 168 hours. No results for input(s): LIPASE, AMYLASE in the last 168 hours. No results for input(s): AMMONIA in the last 168 hours. Coagulation Profile: No results for input(s): INR, PROTIME in the last 168 hours. Cardiac Enzymes: No results for input(s): CKTOTAL, CKMB, CKMBINDEX, TROPONINI in the last 168 hours. BNP (last 3 results) No results for input(s): PROBNP in the last 8760  hours. HbA1C: No results for input(s): HGBA1C in the last 72 hours. CBG: Recent Labs  Lab 04/09/20 1818 04/09/20 2058 04/09/20 2344 04/10/20 0354 04/10/20 1149  GLUCAP 81 91 86 75 79   Lipid Profile: No results for input(s): CHOL, HDL, LDLCALC, TRIG, CHOLHDL, LDLDIRECT in the last 72 hours. Thyroid Function Tests: No results for input(s): TSH, T4TOTAL, FREET4, T3FREE, THYROIDAB in the last 72 hours. Anemia Panel: No results for input(s): VITAMINB12, FOLATE, FERRITIN, TIBC, IRON, RETICCTPCT in the last 72 hours. Urine analysis:    Component Value Date/Time   COLORURINE YELLOW 03/31/2020 1407   APPEARANCEUR CLOUDY (A) 03/31/2020 1407   LABSPEC 1.013 03/31/2020 1407   PHURINE 5.0 03/31/2020 1407   GLUCOSEU NEGATIVE 03/31/2020 1407   HGBUR MODERATE (A) 03/31/2020 1407   BILIRUBINUR NEGATIVE 03/31/2020 1407   KETONESUR 5 (A) 03/31/2020 1407   PROTEINUR >=300 (A) 03/31/2020 1407   NITRITE NEGATIVE 03/31/2020  [place] (A) 03/31/2020 1407   Sepsis Labs: @LABRCNTIP (procalcitonin:4,lacticidven:4)  ) Recent Results (from the past 240 hour(s))  Urine culture     Status: Abnormal   Collection Time: 03/31/20  2:07 PM   Specimen: In/Out Cath Urine  Result Value Ref Range Status   Specimen Description   Final    IN/OUT CATH URINE Performed at [facility], [address], [place], [place] [address]    Special Requests   Final    NONE Performed at [facility], [address], [place], [place] [address]    Culture MULTIPLE SPECIES PRESENT, SUGGEST RECOLLECTION (A)  Final   Report Status 04/02/2020 FINAL  Final  Blood Culture (routine x 2)     Status: None   Collection Time: 03/31/20  2:20 PM   Specimen: BLOOD  Result Value Ref Range Status   Specimen Description BLOOD BLOOD LEFT FOREARM  Final   Special Requests   Final    BOTTLES DRAWN AEROBIC AND ANAEROBIC Blood Culture adequate volume   Culture   Final    NO GROWTH 5 DAYS Performed at [facility], [address], [place], [place] [address]    Report Status 04/05/2020 FINAL  Final  Blood Culture (routine x 2)     Status: None   Collection Time: 03/31/20  2:20 PM   Specimen: BLOOD  Result Value Ref Range Status   Specimen Description BLOOD LEFT ANTECUBITAL  Final   Special Requests   Final    BOTTLES DRAWN AEROBIC AND ANAEROBIC Blood Culture adequate volume   Culture   Final    NO GROWTH 5 DAYS Performed at [facility], [address], [place],  [address]    Report Status 04/05/2020 FINAL  Final  Resp Panel by RT-PCR (Flu A&B, Covid) Nasopharyngeal Swab     Status: None   Collection Time: 03/31/20  2:48 PM   Specimen: Nasopharyngeal Swab; Nasopharyngeal(NP) swabs in vial transport medium  Result Value Ref Range Status   SARS Coronavirus 2 by RT PCR NEGATIVE NEGATIVE Final    Comment: (NOTE) SARS-CoV-2 target nucleic acids are NOT DETECTED.  The SARS-CoV-2 RNA is generally detectable in upper respiratory specimens during the acute phase of infection. The lowest concentration of SARS-CoV-2 viral copies this assay can detect is 138 copies/mL. A negative result does not preclude SARS-Cov-2 infection and should not be used as the sole basis for treatment or other patient management decisions. A negative result may occur with  improper specimen collection/handling, submission of specimen other than nasopharyngeal swab, presence of viral mutation(s) within the areas targeted by this assay, and inadequate number of viral copies(<138 copies/mL). A negative result must be combined with clinical observations, patient history, and epidemiological information. The expected result is Negative.  Fact Sheet for Patients:  EntrepreneurPulse.com.au  Fact Sheet for Healthcare Providers:  IncredibleEmployment.be  This test is no t yet approved or cleared by the [place] FDA and  has been authorized for detection and/or diagnosis of  SARS-CoV-2 by FDA under an Emergency Use Authorization (EUA). This EUA will remain  in effect (meaning this test can be used) for the duration of the COVID-19 declaration under Section 564(b)(1) of the Act, 21 U.S.C.section 360bbb-3(b)(1), unless the authorization is terminated  or revoked sooner.       Influenza A by PCR NEGATIVE NEGATIVE Final   Influenza B by PCR NEGATIVE NEGATIVE Final    Comment: (NOTE) The Xpert Xpress SARS-CoV-2/FLU/RSV plus assay is intended as an  aid in the diagnosis of influenza from Nasopharyngeal swab specimens and should not be used as a sole basis for treatment. Nasal washings and aspirates are unacceptable for Xpert Xpress SARS-CoV-2/FLU/RSV testing.  Fact Sheet for Patients: EntrepreneurPulse.com.au  Fact Sheet for Healthcare Providers: IncredibleEmployment.be  This test is not yet approved or cleared by the [place] FDA and has been authorized for detection and/or diagnosis of SARS-CoV-2 by FDA under an Emergency Use Authorization (EUA). This EUA will remain in effect (meaning this test can be used) for the duration of the COVID-19 declaration under Section 564(b)(1) of the Act, 21 U.S.C. section 360bbb-3(b)(1), unless the authorization is terminated or revoked.  Performed at [facility], [address], [place], [place] [address]   Urine Culture     Status: Abnormal   Collection Time: 03/31/20 10:27 PM   Specimen: In/Out Cath Urine  Result Value Ref Range Status   Specimen Description IN/OUT CATH URINE  Final   Special Requests   Final    NONE Performed at [facility], [place] [address], [place], [place] [address]    Culture MULTIPLE SPECIES PRESENT, SUGGEST RECOLLECTION (A)  Final   Report Status 04/02/2020 FINAL  Final  Urine culture     Status: Abnormal   Collection Time: 04/01/20  1:57 PM   Specimen: Urine, Random  Result Value Ref Range Status   Specimen Description URINE, RANDOM   Final   Special Requests   Final    LEFT KIDNEY Performed at [facility], [place] [address], [place], [place] [address]    Culture MULTIPLE SPECIES PRESENT, SUGGEST RECOLLECTION (A)  Final   Report Status 04/02/2020 FINAL  Final      Radiology Studies: No results found.   Scheduled Meds: . calcium carbonate  1 tablet Oral TID  . Chlorhexidine Gluconate Cloth  6 each Topical Daily  . DULoxetine  60 mg Oral Daily   And  . DULoxetine  30 mg Oral QHS  . feeding supplement  237 mL Oral BID BM  . heparin  5,000 Units Subcutaneous Q8H  . multivitamin with minerals  1 tablet Oral BID  . pantoprazole  40 mg Oral QHS  . sodium bicarbonate  650 mg Oral BID  . sodium chloride flush  5 mL Intracatheter Q8H  . tamsulosin  0.4 mg Oral QHS   Continuous Infusions:   LOS: 10 days   Time Spent in minutes   30 minutes  Francis Doenges D.O. on 04/10/2020 at 12:03 PM  Between 7am to 7pm - Please see pager noted on [email or web address]  After 7pm go to [email or web address]  And look for the night coverage person covering for me after hours  [facility] Office  [phone]

## 2020-04-10 NOTE — Patient Instructions (Signed)
DUE TO COVID-19 ONLY ONE VISITOR IS ALLOWED TO COME WITH YOU AND STAY IN THE WAITING ROOM ONLY DURING PRE OP AND PROCEDURE DAY OF SURGERY. THE 1 VISITOR  MAY VISIT WITH YOU AFTER SURGERY IN YOUR PRIVATE ROOM DURING VISITING HOURS ONLY!  YOU NEED TO HAVE A COVID 19 TEST ON: 04/19/19 @             , THIS TEST MUST BE DONE BEFORE SURGERY,  COVID TESTING SITE [place] [id number], IT IS ON THE RIGHT GOING OUT [address] APPROXIMATELY  2 MINUTES PAST [employer] ON THE RIGHT. ONCE YOUR COVID TEST IS COMPLETED,  PLEASE BEGIN THE QUARANTINE INSTRUCTIONS AS OUTLINED IN YOUR HANDOUT.                [name]   Your procedure is scheduled on: 04/21/20   Report to [facility] Main  Entrance   Report to short stay at: 5:30 AM     Call this number if you have problems the morning of surgery [phone]    Remember:   DRINK 2 PRESURGERY ENSURE DRINKS THE NIGHT BEFORE SURGERY AT  1000 PM AND 1 PRESURGERY DRINK THE DAY OF THE PROCEDURE 3 HOURS PRIOR TO SCHEDULED SURGERY. NO SOLIDS AFTER MIDNIGHT THE DAY PRIOR TO THE SURGERY. NOTHING BY MOUTH EXCEPT CLEAR LIQUIDS UNTIL THREE HOURS PRIOR TO SCHEDULED SURGERY. PLEASE FINISH PRESURGERY ENSURE DRINK PER SURGEON ORDER 3 HOURS PRIOR TO SCHEDULED SURGERY TIME WHICH NEEDS TO BE COMPLETED AT: 4:30 AM.  CLEAR LIQUID DIET   Foods Allowed                                                                     Foods Excluded  Coffee and tea, regular and decaf                             liquids that you cannot  Plain Jell-O any favor except red or purple                                           see through such as: Fruit ices (not with fruit pulp)                                     milk, soups, orange juice  Iced Popsicles                                    All solid food Carbonated beverages, regular and diet                                    Cranberry, grape and apple juices Sports drinks like  Gatorade Lightly seasoned clear broth or consume(fat free) Sugar, honey syrup  Sample Menu Breakfast  Lunch                                     Supper Cranberry juice                    Beef broth                            Chicken broth Jell-O                                     Grape juice                           Apple juice Coffee or tea                        Jell-O                                      Popsicle                                                Coffee or tea                        Coffee or tea  _____________________________________________________________________  DRINK PLENTY CLEAR LIQUIDS THE DAY OF THE PREP.   BRUSH YOUR TEETH MORNING OF SURGERY AND RINSE YOUR MOUTH OUT, NO CHEWING GUM CANDY OR MINTS.    Take these medicines the morning of surgery with A SIP OF WATER: DULOXETINE,PANTOPRAZOLE,GABAPENTIN.                               You may not have any metal on your body including hair pins and              piercings  Do not wear jewelry, lotions, powders or perfumes, deodorant             Men may shave face and neck.   Do not bring valuables to the hospital. [place].  Contacts, dentures or bridgework may not be worn into surgery.  Leave suitcase in the car. After surgery it may be brought to your room.     Patients discharged the day of surgery will not be allowed to drive home. IF YOU ARE HAVING SURGERY AND GOING HOME THE SAME DAY, YOU MUST HAVE AN ADULT TO DRIVE YOU HOME AND BE WITH YOU FOR 24 HOURS. YOU MAY GO HOME BY TAXI OR UBER OR ORTHERWISE, BUT AN ADULT MUST ACCOMPANY YOU HOME AND STAY WITH YOU FOR 24 HOURS.  Name and phone number of your driver:  Special Instructions: N/A              Please read over the following fact sheets you were given: _____________________________________________________________________         [facility] -  Preparing for Surgery Before  surgery, you can play an important role.  Because skin is not sterile, your skin needs to be as free of germs as possible.  You can reduce the number of germs on your skin by washing with CHG (chlorahexidine gluconate) soap before surgery.  CHG is an antiseptic cleaner which kills germs and bonds with the skin to continue killing germs even after washing. Please DO NOT use if you have an allergy to CHG or antibacterial soaps.  If your skin becomes reddened/irritated stop using the CHG and inform your nurse when you arrive at Short Stay. Do not shave (including legs and underarms) for at least 48 hours prior to the first CHG shower.  You may shave your face/neck. Please follow these instructions carefully:  1.  Shower with CHG Soap the night before surgery and the  morning of Surgery.  2.  If you choose to wash your hair, wash your hair first as usual with your  normal  shampoo.  3.  After you shampoo, rinse your hair and body thoroughly to remove the  shampoo.                           4.  Use CHG as you would any other liquid soap.  You can apply chg directly  to the skin and wash                       Gently with a scrungie or clean washcloth.  5.  Apply the CHG Soap to your body ONLY FROM THE NECK DOWN.   Do not use on face/ open                           Wound or open sores. Avoid contact with eyes, ears mouth and genitals (private parts).                       Wash face,  Genitals (private parts) with your normal soap.             6.  Wash thoroughly, paying special attention to the area where your surgery  will be performed.  7.  Thoroughly rinse your body with warm water from the neck down.  8.  DO NOT shower/wash with your normal soap after using and rinsing off  the CHG Soap.                9.  Pat yourself dry with a clean towel.            10.  Wear clean pajamas.            11.  Place clean sheets on your bed the night of your first shower and do not  sleep with pets. Day of Surgery  : Do not apply any lotions/deodorants the morning of surgery.  Please wear clean clothes to the hospital/surgery center.  FAILURE TO FOLLOW THESE INSTRUCTIONS MAY RESULT IN THE CANCELLATION OF YOUR SURGERY PATIENT SIGNATURE_________________________________  NURSE SIGNATURE__________________________________  ________________________________________________________________________   Adam Phenix  An incentive spirometer is a tool that can help keep your lungs clear and active. This tool measures how well you are filling your lungs with each breath. Taking long deep breaths may help reverse or decrease the chance of developing breathing (pulmonary) problems (especially infection) following:  A long period of time when you  are unable to move or be active. BEFORE THE PROCEDURE   If the spirometer includes an indicator to show your best effort, your nurse or respiratory therapist will set it to a desired goal.  If possible, sit up straight or lean slightly forward. Try not to slouch.  Hold the incentive spirometer in an upright position. INSTRUCTIONS FOR USE  1. Sit on the edge of your bed if possible, or sit up as far as you can in bed or on a chair. 2. Hold the incentive spirometer in an upright position. 3. Breathe out normally. 4. Place the mouthpiece in your mouth and seal your lips tightly around it. 5. Breathe in slowly and as deeply as possible, raising the piston or the ball toward the top of the column. 6. Hold your breath for 3-5 seconds or for as long as possible. Allow the piston or ball to fall to the bottom of the column. 7. Remove the mouthpiece from your mouth and breathe out normally. 8. Rest for a few seconds and repeat Steps 1 through 7 at least 10 times every 1-2 hours when you are awake. Take your time and take a few normal breaths between deep breaths. 9. The spirometer may include an indicator to show your best effort. Use the indicator as a goal to work  toward during each repetition. 10. After each set of 10 deep breaths, practice coughing to be sure your lungs are clear. If you have an incision (the cut made at the time of surgery), support your incision when coughing by placing a pillow or rolled up towels firmly against it. Once you are able to get out of bed, walk around indoors and cough well. You may stop using the incentive spirometer when instructed by your caregiver.  RISKS AND COMPLICATIONS  Take your time so you do not get dizzy or light-headed.  If you are in pain, you may need to take or ask for pain medication before doing incentive spirometry. It is harder to take a deep breath if you are having pain. AFTER USE  Rest and breathe slowly and easily.  It can be helpful to keep track of a log of your progress. Your caregiver can provide you with a simple table to help with this. If you are using the spirometer at home, follow these instructions: [place] IF:   You are having difficultly using the spirometer.  You have trouble using the spirometer as often as instructed.  Your pain medication is not giving enough relief while using the spirometer.  You develop fever of 100.5 F (38.1 C) or higher. SEEK IMMEDIATE MEDICAL CARE IF:   You cough up bloody sputum that had not been present before.  You develop fever of 102 F (38.9 C) or greater.  You develop worsening pain at or near the incision site. MAKE SURE YOU:   Understand these instructions.  Will watch your condition.  Will get help right away if you are not doing well or get worse. Document Released: 07/29/2006 Document Revised: 06/10/2011 Document Reviewed: 09/29/2006 [facility] Patient Information 2014 [place], [place].   ________________________________________________________________________

## 2020-04-10 NOTE — TOC Progression Note (Addendum)
Transition of Care (TOC) - Progression Note    Patient Details  Name: Chase KELTY Sr. MRN: [id number] Date of Birth: [birth year]  Transition of Care [facility]) CM/SW [place], [place] Phone Number: 04/10/2020, 2:49 PM  Clinical Narrative:    Pt now wants SNF. SNF workup complete. CSW submitted auth request to [place]. TOC will continue to follow for placement.   CSW updated [place] from [facility]. She stated that they would accept pt back and to notify her of pt chose to return home.   Expected Discharge Plan: Skilled Nursing Facility Barriers to Discharge: No SNF bed  Expected Discharge Plan and Services Expected Discharge Plan: [place] arrangements for the past 2 months: Apartment                                       Social Determinants of Health (SDOH) Interventions    Readmission Risk Interventions Readmission Risk Prevention Plan 12/25/2019 11/19/2019  Transportation Screening Complete Complete  Medication Review Press photographer) Complete Complete  PCP or Specialist appointment within 3-5 days of discharge - Complete  HRI or [place] - Complete  SW Recovery Care/Counseling Consult - Complete  [place] - Complete  Some recent data might be hidden

## 2020-04-10 NOTE — Progress Notes (Signed)
Pt. Refused foley care.

## 2020-04-10 NOTE — Plan of Care (Signed)
?  Problem: Clinical Measurements: ?Goal: Diagnostic test results will improve ?Outcome: Progressing ?  ?Problem: Safety: ?Goal: Ability to remain free from injury will improve ?Outcome: Progressing ?  ?

## 2020-04-10 NOTE — Progress Notes (Signed)
[facility] Progress Note    Assessment/ Plan:   Assessment/Recommendations: Chase FORTENBERRY Sr. is a/an 55 y.o. male with a past medical history significant for gastric bypass, hodgkin's lymphoma, colovesical fistula, bilateral ureteral stenosis w/ ckd and hydronephrosiswho present w/ acute renal failure, sepsis, and hydronephrosis  Non-OliguricAKI on CKD V 2/2 urinary obstruction and dehydration w/ severe electrolyte derrangements.CKD secondary to longstanding obstruction. BL Crt around 5-6. AKI likely dehydration with and obstruction. S/p HD x2 now holding further treatments -renal diet -Nephrostomy tubes in place -Patient undecided about HD but no plans for now. His BL Crt is 5-6 so his recent rise is expected and he would then plateau within 5-6. I discussed with son that he is a poor candidate for long term dialysis and they will plan to have ongoing discussion. He will need to be seen in our clinic for discussions to continue; we will set up f/u  Bilateral hydronephrosis: History of such presenting with removal of his nephrostomy tubes and hydronephrosis. PCN in place working well   Sepsis: Likely urosepsis. Now significantly improved.  Severe metabolic acidosis: increasing nahco3 to 1300mg  tid  Anemia of CKD: Consider ESA outpatient , iron panel ordered    Subjective:    No acute events, uop 1.8L. wants pain medication   Objective:   BP 113/69 (BP Location: Left Arm)   Pulse 76   Temp 98.3 F (36.8 C) (Oral)   Resp 14   Ht 5\' 6"  (1.676 m)   Wt 56.7 kg   SpO2 97%   BMI 20.18 kg/m   Intake/Output Summary (Last 24 hours) at 04/10/2020 1302 Last data filed at 04/10/2020 0700 Gross per 24 hour  Intake 582 ml  Output 1870 ml  Net -1288 ml   Weight change: -2.9 kg  Physical Exam: Gen: NAD, lying in bed, frail and pale CVS: RRR Resp: bilateral chest rise, no iwob Abd: soft GU: bilateral PCN tubes in place, draining yellow urine Ext: no  LE edema  Imaging: No results found.  Labs: BMET Recent Labs  Lab 04/04/20 0447 04/05/20 0158 04/06/20 0352 04/07/20 0115 04/08/20 0422 04/09/20 0250 04/10/20 0321  NA 136 134* 133* 133* 135 135 134*  K 3.4* 3.9 4.4 4.1 4.6 4.4 5.2*  CL 101 102 101 102 105 105 106  CO2 23 23 21* 20* 20* 19* 17*  GLUCOSE 98 89 87 111* 86 98 92  BUN 28* 33* 39* 41* 39* 41* 45*  CREATININE 4.22* 4.43* 4.74* 4.86* 4.99* 5.09* 5.34*  CALCIUM 7.0* 7.0* 6.8* 6.9* 7.1* 7.3* 7.4*   CBC Recent Labs  Lab 04/04/20 0447 04/04/20 2054 04/05/20 0158 04/06/20 0352  WBC 10.3 10.2 9.1 7.9  NEUTROABS  --  8.2*  --   --   HGB 7.0* 8.3* 8.2* 8.3*  HCT 20.7* 23.7* 24.0* 24.3*  MCV 87.7 88.8 88.6 90.0  PLT 159 166 175 168    Medications:    . calcium carbonate  1 tablet Oral TID  . Chlorhexidine Gluconate Cloth  6 each Topical Daily  . DULoxetine  60 mg Oral Daily   And  . DULoxetine  30 mg Oral QHS  . feeding supplement  237 mL Oral BID BM  . heparin  5,000 Units Subcutaneous Q8H  . multivitamin with minerals  1 tablet Oral BID  . pantoprazole  40 mg Oral QHS  . sodium bicarbonate  650 mg Oral BID  . sodium chloride flush  5 mL Intracatheter Q8H  . sodium polystyrene  15 g Oral Once  . tamsulosin  0.4 mg Oral QHS     Allyah Heather  04/10/2020, 1:02 PM

## 2020-04-10 NOTE — Progress Notes (Signed)
Patient son updated with patient permission at shift change.

## 2020-04-10 NOTE — NC FL2 (Signed)
[place] LEVEL OF CARE SCREENING TOOL     IDENTIFICATION  Patient Name: Chase [name]. Birthdate: 1965/05/19 Sex: male Admission Date (Current Location): 03/31/2020  [facility] and [place] Number:  Herbalist and Address:  [employer] [facility], [place] [address], [place], [place] [address]      Provider Number: [id number]  Attending Physician Name and Address:  Cristal Ford, DO  Relative Name and Phone Number:  [place], [place] [facility])   [phone] [facility])    Current Level of Care: Hospital Recommended Level of Care: [place] Prior Approval Number:    Date Approved/Denied:   PASRR Number: [id number]  Discharge Plan: SNF    Current Diagnoses: Patient Active Problem List   Diagnosis Date Noted  . DNR (do not resuscitate) discussion   . Palliative care by specialist   . Adult failure to thrive   . Sepsis with acute renal failure without septic shock ([place]) 03/31/2020  . Incisional hernia - epigastric 01/04/2020  . Chronic narcotic use 01/02/2020  . Anemia in chronic kidney disease 01/02/2020  . ATN (acute tubular necrosis) ([place]) 12/31/2019  . Candida UTI 12/31/2019  . Electrolyte abnormality 12/31/2019  . Increased anion gap metabolic acidosis 17/40/8144  . Microcytic anemia 12/31/2019  . ABLA (acute blood loss anemia) 12/31/2019  . Anxiety and depression 12/31/2019  . History of CVA (cerebrovascular accident) 12/31/2019  . BMI 24.0-24.9, adult 12/31/2019  . Bilateral ureteral obstruction s/p perc nephrosotmy & stenting 12/30/2019  . AKI (acute kidney injury) ([place]) 12/24/2019  . 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 Pelvic abscess with colovescial fistula 11/15/2019  . E. coli UTI (urinary tract infection) 11/15/2019  . PTSD (post-traumatic stress disorder)   . Chemical exposure   . Cervical radiculopathy   . Septic shock ([place]) 11/08/2019  . Protein-calorie malnutrition, severe 10/23/2019  . Cerebral  embolism with cerebral infarction 10/18/2019  . Acute left-sided weakness 10/17/2019  . CKD (chronic kidney disease), stage IV ([place]) 10/17/2019  . Chronic pain 10/17/2019  . Fall 10/17/2019  . Pseudomonas urinary tract infection 08/24/2019  . BPH (benign prostatic hyperplasia) 08/24/2019  . Acute renal failure superimposed on stage 5 chronic kidney disease, not on chronic dialysis ([place]) 08/24/2019  . Anemia 08/24/2019  . Dyspnea 08/24/2019  . History of COVID-19 08/24/2019  . History of elevated PSA 08/24/2019  . 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 Hyponatremia 08/24/2019  . Metabolic acidosis 81/85/6314  . Skin lesion of neck 08/24/2019  . Toxic metabolic encephalopathy 97/05/6376  . Cervical spondylosis without myelopathy 06/19/2016  . Neck pain 09/04/2015  . History of stomach ulcers 05/19/2013  . Male erectile dysfunction 05/19/2013  . Hodgkin lymphoma, unspecified, unspecified site ([place]) 12/10/2012  . Iron deficiency anemia 12/10/2012  . Pernicious anemia 12/10/2012  . Arthralgia of multiple sites 04/20/2012  . Postlaminectomy syndrome, not elsewhere classified 04/20/2012    Orientation RESPIRATION BLADDER Height & Weight     Self,Time,Situation,Place  Normal Indwelling catheter Weight: 125 lb (56.7 kg) Height:  5\' 6"  (167.6 cm)  BEHAVIORAL SYMPTOMS/MOOD NEUROLOGICAL BOWEL NUTRITION STATUS      Continent Diet (see d/c summary)  AMBULATORY STATUS COMMUNICATION OF NEEDS Skin   Extensive Assist   PU Stage and Appropriate Care,Other (Comment) (Pu Coccyx; 2 Nephrostomy tubes)                       Personal Care Assistance Level of Assistance  Bathing,Feeding,Dressing Bathing Assistance: Limited assistance Feeding  assistance: Independent Dressing Assistance: Limited assistance     Functional Limitations Info  Sight,Hearing,Speech Sight Info: Adequate Hearing Info: Adequate Speech Info: Adequate    SPECIAL CARE FACTORS FREQUENCY  PT (By licensed PT),OT (By  licensed OT)     PT Frequency: 5x/week OT Frequency: 5x/week            Contractures Contractures Info: Not present    Additional Factors Info  Code Status,Allergies Code Status Info: Full Allergies Info: Ciprofloxacin, NSAIDS           Current Medications (04/10/2020):  This is the current hospital active medication list Current Facility-Administered Medications  Medication Dose Route Frequency Provider Last Rate Last Admin  . acetaminophen (TYLENOL) tablet 650 mg  650 mg Oral Q6H PRN Opyd, Ilene Qua, MD   650 mg at 04/09/20 0121   Or  . acetaminophen (TYLENOL) suppository 650 mg  650 mg Rectal Q6H PRN Opyd, Ilene Qua, MD   650 mg at 04/03/20 0001  . calcium carbonate (TUMS - dosed in mg elemental calcium) chewable tablet 200 mg of elemental calcium  1 tablet Oral TID Vernell Leep D, MD   200 mg of elemental calcium at 04/10/20 0843  . Chlorhexidine Gluconate Cloth 2 % PADS 6 each  6 each Topical Daily Charlynne Cousins, MD   6 each at 04/06/20 0945  . DULoxetine (CYMBALTA) DR capsule 60 mg  60 mg Oral Daily Charlynne Cousins, MD   60 mg at 04/10/20 0843   And  . DULoxetine (CYMBALTA) DR capsule 30 mg  30 mg Oral QHS Charlynne Cousins, MD   30 mg at 04/09/20 2353  . feeding supplement (ENSURE ENLIVE / ENSURE PLUS) liquid 237 mL  237 mL Oral BID BM Hongalgi, Anand D, MD      . heparin injection 5,000 Units  5,000 Units Subcutaneous Q8H Opyd, Ilene Qua, MD   5,000 Units at 04/10/20 0709  . iohexol (OMNIPAQUE) 300 MG/ML solution 50 mL  50 mL Per Tube Once PRN Aletta Edouard, MD      . multivitamin with minerals tablet 1 tablet  1 tablet Oral BID Modena Jansky, MD   1 tablet at 04/10/20 0843  . ondansetron (ZOFRAN) tablet 4 mg  4 mg Oral Q6H PRN Opyd, Ilene Qua, MD       Or  . ondansetron (ZOFRAN) injection 4 mg  4 mg Intravenous Q6H PRN Opyd, Ilene Qua, MD   4 mg at 04/08/20 1347  . oxyCODONE-acetaminophen (PERCOCET/ROXICET) 5-325 MG per tablet 1-2 tablet  1-2  tablet Oral Q4H PRN Cristal Ford, DO   2 tablet at 04/10/20 [phone]  . pantoprazole (PROTONIX) EC tablet 40 mg  40 mg Oral QHS Charlynne Cousins, MD   40 mg at 04/09/20 2354  . sodium bicarbonate tablet 650 mg  650 mg Oral BID Charlynne Cousins, MD   650 mg at 04/10/20 0843  . sodium chloride flush (NS) 0.9 % injection 5 mL  5 mL Intracatheter Q8H Aletta Edouard, MD   5 mL at 04/10/20 0704  . tamsulosin (FLOMAX) capsule 0.4 mg  0.4 mg Oral QHS Charlynne Cousins, MD   0.4 mg at 04/09/20 2353  . tiZANidine (ZANAFLEX) tablet 2 mg  2 mg Oral BID PRN Cristal Ford, DO       Facility-Administered Medications Ordered in Other Encounters  Medication Dose Route Frequency Provider Last Rate Last Admin  . bupivacaine liposome (EXPAREL) 1.3 % injection 266 mg  20 mL  Infiltration Once Michael Boston, MD         Discharge Medications: Please see discharge summary for a list of discharge medications.  Relevant Imaging Results:  Relevant Lab Results:   Additional Information SSN: [id number]  Bethann Berkshire, LCSW

## 2020-04-10 NOTE — Progress Notes (Signed)
PT Cancellation Note  Patient Details Name: Chase CHENIER Sr. MRN: [id number] DOB: 09-25-1965   Cancelled Treatment:    Reason Eval/Treat Not Completed: Other (comment) Pt refusing therapy session x 2 attempts. On first attempt, pt requesting pain medication prior; RN notified. On 2nd attempt, pt received pain medication 45 minutes prior but pt still stating he would like more time and to be able to rest. Had a 10 minute conversation with pt regarding his goals for physical therapy and discharge. Pt ultimately telling me to "quit playing the rationalization game with him," and refusing any out of bed mobility. Discussed that we will try once more and if he refuses for a 3rd time, per departmental policy, he will be discharged from our caseload.  Chase [name], PT, DPT Acute Rehabilitation Services Pager [phone] Office [phone]    Chase [name] 04/10/2020, 2:39 PM

## 2020-04-11 ENCOUNTER — Encounter (HOSPITAL_COMMUNITY)
Admission: RE | Admit: 2020-04-11 | Discharge: 2020-04-11 | Disposition: A | Discharge status: Home / Self Care | Payer: No Typology Code available for payment source | Source: Ambulatory Visit

## 2020-04-11 ENCOUNTER — Other Ambulatory Visit (HOSPITAL_COMMUNITY): Payer: No Typology Code available for payment source

## 2020-04-11 DIAGNOSIS — N179 Acute kidney failure, unspecified: Secondary | ICD-10-CM | POA: Diagnosis not present

## 2020-04-11 DIAGNOSIS — R531 Weakness: Secondary | ICD-10-CM | POA: Diagnosis not present

## 2020-04-11 DIAGNOSIS — Z7189 Other specified counseling: Secondary | ICD-10-CM | POA: Diagnosis not present

## 2020-04-11 DIAGNOSIS — R627 Adult failure to thrive: Secondary | ICD-10-CM | POA: Diagnosis not present

## 2020-04-11 DIAGNOSIS — N135 Crossing vessel and stricture of ureter without hydronephrosis: Secondary | ICD-10-CM | POA: Diagnosis not present

## 2020-04-11 DIAGNOSIS — A419 Sepsis, unspecified organism: Secondary | ICD-10-CM | POA: Diagnosis not present

## 2020-04-11 LAB — IRON AND TIBC
Iron: 68 ug/dL (ref 45–182)
Saturation Ratios: 52 % — ABNORMAL HIGH (ref 17.9–39.5)
TIBC: 132 ug/dL — ABNORMAL LOW (ref 250–450)
UIBC: 64 ug/dL

## 2020-04-11 LAB — BASIC METABOLIC PANEL
Anion gap: 10 (ref 5–15)
BUN: 41 mg/dL — ABNORMAL HIGH (ref 6–20)
CO2: 22 mmol/L (ref 22–32)
Calcium: 7.7 mg/dL — ABNORMAL LOW (ref 8.9–10.3)
Chloride: 104 mmol/L (ref 98–111)
Creatinine, Ser: 5.08 mg/dL — ABNORMAL HIGH (ref 0.61–1.24)
GFR, Estimated: 13 mL/min — ABNORMAL LOW (ref >60)
Glucose, Bld: 83 mg/dL (ref 70–99)
Potassium: 4.9 mmol/L (ref 3.5–5.1)
Sodium: 136 mmol/L (ref 135–145)

## 2020-04-11 LAB — GLUCOSE, CAPILLARY
Glucose-Capillary: 81 mg/dL (ref 70–99)
Glucose-Capillary: 167 mg/dL — ABNORMAL HIGH (ref 70–99)
Glucose-Capillary: 92 mg/dL (ref 70–99)
Glucose-Capillary: 93 mg/dL (ref 70–99)
Glucose-Capillary: 123 mg/dL — ABNORMAL HIGH (ref 70–99)

## 2020-04-11 LAB — HEMOGLOBIN AND HEMATOCRIT, BLOOD
HCT: 27.8 % — ABNORMAL LOW (ref 39.0–52.0)
Hemoglobin: 8.6 g/dL — ABNORMAL LOW (ref 13.0–17.0)

## 2020-04-11 LAB — FERRITIN: Ferritin: 332 ng/mL (ref 24–336)

## 2020-04-11 MED ORDER — DARBEPOETIN ALFA 60 MCG/0.3ML IJ SOSY: 60.0000 ug | PREFILLED_SYRINGE | Freq: Once | INTRAMUSCULAR | Status: DC

## 2020-04-11 MED ORDER — DARBEPOETIN ALFA 40 MCG/0.4ML IJ SOSY
40.0000 ug | PREFILLED_SYRINGE | Freq: Once | INTRAMUSCULAR | Status: AC
  Administered 2020-04-11: 40 ug via SUBCUTANEOUS
  Filled 2020-04-11: qty 0.4

## 2020-04-11 NOTE — Plan of Care (Signed)
  Problem: Health Behavior/Discharge Planning: Goal: Ability to manage health-related needs will improve Outcome: Progressing   Problem: Nutrition: Goal: Adequate nutrition will be maintained Outcome: Progressing   Problem: Coping: Goal: Level of anxiety will decrease Outcome: Progressing   Problem: Safety: Goal: Ability to remain free from injury will improve Outcome: Progressing   

## 2020-04-11 NOTE — Progress Notes (Signed)
Patient ID: Chase [name]., male   DOB: [birth year], 55 y.o.   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   Found to have creatinine of 16. Also with severe urosepsis.   CT scan showed bilateral hydronephrosis with a left transgluteal drainage without fluid collection and a large colonic stool burden. Bilateral percutaneous nephrostomy tubes placed by IR 1/1. Plans for short term HD vs CRRT. Poor candidate for outpatient dialysis. PMT consulted to discuss [place].   Per Nephrology no need for dialysis at this time, temporary cath removed today. Patient is likely going to face decision regarding dialysis in the near future.     Creatine 5.08 today   Discussed this in detail with patient today and understands that given his lack of functionality, physical deconditioning, and severe malnutrition that he will not be an ideal dialysis candidate especially given the risks associated with it, patient agrees that dialysis will not be in his best interest but wants to think about it more. He will need to be seen in our clinic for discussions to continue  Patient faces treatment option decisions, advanced directive decisions and anticipatory care needs.   Today's Discussion : 04-12-19  Visited patient at bedside.   Continued education regarding current medical situation; diagnosis, prognosis, GOCs, EOL wishes, disposition and anticipatory care needs.  Education offered regarding ESRD and long  term dialysis.  Discussed trajectory and associated care needs.   Patient is abrupt and disengaged today.  " I don't want to talk about anything right now".    Spoke to son/[name]  by telephone   His son is frustrated with the patient.  He is pleased to hear that that the patient is open to SNF for rehab Emotional support offered.    Education offered on medical  capacityDiscussed that pateint has the right to make decisions as long as he has capacity and willing to accept the consequences.. He has little  family/social support to help him at home.  Again raised awareness to importance of ACP decisions and next steps in treatment plan.  Recommendation for DNR/DNI:  Encouraged patient to consider DNR/DNI status understanding evidenced based poor outcomes in similar hospitalized patient, as the cause of arrest is likely associated with advanced chronic illness rather than an easily reversible acute cardio-pulmonary event.    Patient remains Full Code  Discussed the importance of continued conversation with his family and the medical providers regarding overall plan of care and treatment options, ensuring decisions are within the context of the patients values and GOCs.  No questions or concerns at this time    Vital Signs Vitals:   04/11/20 0418 04/11/20 1138  BP: 97/63   Pulse: 75   Resp: 18   Temp: 98.4 F (36.9 C) 97.8 F (36.6 C)  SpO2: 97%     Intake/Output Summary (Last 24 hours) at 04/11/2020 1259 Last data filed at 04/11/2020 1200 Gross per 24 hour  Intake 720 ml  Output 1775 ml  Net -1055 ml   Last Weight  Most recent update: 04/11/2020  4:21  AM   Weight  55.8 kg (123 lb 0.3 oz)            Recommendations and Plan:  -Full code -refuses to discuss any decisions regarding plan of care -SNF on discharge -PMT will continue to support holistically   Time Spent: 35 minutes Greater than 50% of the time was spent in counseling and coordination of  care ______________________________________________________________________________________ Wadie Lessen NP  [place] Team Team Cell Phone: [phone] Please utilize secure chat with additional questions, if there is no response within 30 minutes please call the above phone number  Palliative Medicine Team providers are available by phone from 7am to 7pm daily and can be reached through the team cell phone.  Should this patient require assistance outside of these hours, please call the patient's attending physician.

## 2020-04-11 NOTE — Progress Notes (Signed)
[name]    Assessment/ Plan:   Assessment/Recommendations: Chase NAAS Sr. is a/an 55 y.o. male with a past medical history significant for gastric bypass, hodgkin's lymphoma, colovesical fistula, bilateral ureteral stenosis w/ ckd and hydronephrosiswho present w/ acute renal failure, sepsis, and hydronephrosis  Non-OliguricAKI on CKD V 2/2 urinary obstruction and dehydration w/ severe electrolyte derrangements.CKD secondary to longstanding obstruction. BL Crt around 5-6. AKI likely dehydration with and obstruction. S/p HD x2 now holding further treatments. Kidney function has settled out to baseline -renal diet -Nephrostomy tubes in place -Patient undecided about HD but no plans for now. His BL Crt is 5-6 so his recent rise is expected and he would then plateau within 5-6. Discussed this in detail with patient today and understands that given his lack of functionality, physical deconditioning, and severe malnutrition that he will not be an ideal dialysis candidate especially given the risks associated with it, patient agrees that dialysis will not be in his best interest but wants to think about it more. He will need to be seen in our clinic for discussions to continue  Bilateral hydronephrosis: History of such presenting with removal of his nephrostomy tubes and hydronephrosis. PCN in place working well   Sepsis: Likely urosepsis. Now significantly improved.  Severe metabolic acidosis: improved. On nahco3 1300mg  tid  Anemia of CKD: aranesp 19mcg today    Subjective:    No acute events, uop 1.4L. no complaints today. Considering SNF   Objective:   BP 97/63 (BP Location: Left Arm)   Pulse 75   Temp 97.8 F (36.6 C) (Oral)   Resp 18   Ht 5\' 6"  (1.676 m)   Wt 55.8 kg   SpO2 97%   BMI 19.86 kg/m   Intake/Output Summary (Last 24 hours) at 04/11/2020 1254 Last data filed at 04/11/2020 1200 Gross per 24 hour  Intake 720 ml  Output  1775 ml  Net -1055 ml   Weight change: -0.9 kg  Physical Exam: Gen: NAD, lying in bed, frail and pale CVS: RRR Resp: bilateral chest rise, no iwob Abd: soft GU: bilateral PCN tubes in place, draining yellow urine Ext: no LE edema  Imaging: No results found.  Labs: BMET Recent Labs  Lab 04/05/20 0158 04/06/20 0352 04/07/20 0115 04/08/20 0422 04/09/20 0250 04/10/20 0321 04/11/20 0334  NA 134* 133* 133* 135 135 134* 136  K 3.9 4.4 4.1 4.6 4.4 5.2* 4.9  CL 102 101 102 105 105 106 104  CO2 23 21* 20* 20* 19* 17* 22  GLUCOSE 89 87 111* 86 98 92 83  BUN 33* 39* 41* 39* 41* 45* 41*  CREATININE 4.43* 4.74* 4.86* 4.99* 5.09* 5.34* 5.08*  CALCIUM 7.0* 6.8* 6.9* 7.1* 7.3* 7.4* 7.7*   CBC Recent Labs  Lab 04/04/20 2054 04/05/20 0158 04/06/20 0352 04/11/20 0334  WBC 10.2 9.1 7.9  --   NEUTROABS 8.2*  --   --   --   HGB 8.3* 8.2* 8.3* 8.6*  HCT 23.7* 24.0* 24.3* 27.8*  MCV 88.8 88.6 90.0  --   PLT 166 175 168  --     Medications:    . calcium carbonate  1 tablet Oral TID  . Chlorhexidine Gluconate Cloth  6 each Topical Daily  . DULoxetine  60 mg Oral Daily   And  . DULoxetine  30 mg Oral QHS  . feeding supplement  237 mL Oral BID BM  . heparin  5,000 Units Subcutaneous Q8H  . multivitamin  with minerals  1 tablet Oral BID  . pantoprazole  40 mg Oral QHS  . sodium bicarbonate  1,300 mg Oral TID  . sodium chloride flush  5 mL Intracatheter Q8H  . tamsulosin  0.4 mg Oral QHS     Jaliyah Fotheringham  04/11/2020, 12:54 PM

## 2020-04-11 NOTE — Progress Notes (Signed)
PROGRESS NOTE    Chase [name]  LDJ:[id number] DOB: 03-10-1966 DOA: 03/31/2020 PCP: [name]   Brief Narrative:  HPI on 03/31/2020 by Dr. Christia Reading Opyd Chase Saxon Sr. is a 55 y.o. 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  Patient was reportedly seen by neighbors yesterday in a recliner and was then found on the floor today obtunded.  He was sent to [facility] emergency department and is unable to provide history due to his clinical condition.  Interim history Admitted with acute renal failure on chronic kidney disease, stage V secondary to urinary obstruction and dehydration with electrolyte abnormalities.  Nephrology consulted.  Patient did undergo hemodialysis.  IR placed bilateral percutaneous nephrostomy tubes.  Creatinine did improve with a couple cycles of hemodialysis however plateaued.  Hemodialysis catheter was then removed.  No further plans for hemodialysis at this time.  Nephrology feels that patient can be discharged home with outpatient follow-up.  At this time, discharge is complicated by patient being extremely weak and deconditioned.  Initially patient did not want to be discharged to SNF however this morning he states that he will consider it. Assessment & Plan   Acute renal failure on chronic kidney disease, stage V -Nephrology consulted and appreciated -Creatinine 5.76 in October 2021, although patient presented with a creatinine of 16.69 -Chronic kidney  disease likely secondary to longstanding obstruction.  Acute kidney injury due to dehydration and sepsis along with ATN with obstruction. -Patient also noted to have severe electrolyte derangements with severe acidosis and likely uremia on presentation. -Was placed on IV fluids -Interventional radiology consulted and appreciated for bilateral percutaneous nephrostomy tube placement, 04/01/2020 as well as temporary HD catheter placement. -Patient did undergo hemodialysis, 2 cycles with last hemodialysis on 04/04/2019. -Creatinine has now plateaued, currently 5.08 -Though creatinine is still elevated, it is below his baseline.  Patient himself is unsure about wanting continuous HD.  Nephrology recommended that patient follow-up with nephrology as an outpatient.  Recommended continuing bicarb on discharge.  Bilateral hydronephrosis -Per IR follow-up on 04/07/2019, patient had urinary obstruction secondary to pelvic mass with subsequent bilateral hydronephrosis in the setting of bilateral ureteral stents, bilateral PCNs inadvertently removed (unknown how long they have been out), status post bilateral PCN placement on 04/01/2020. -Patient will need to follow-up with interventional radiology as well as urology -He also has a chronic Foley catheter as well.  Severe metabolic acidosis -Bicarb was less than 7 on admission, lactate was normal -Suspect secondary to renal failure -Resolving -Patient will need to be discharged with sodium bicarbonate  Hypokalemia -Resolved, continue to monitor and replace as needed  Mild hyperkalemia -Resolved with Kayexalate -continue to monitor  Hyperphosphatemia -Was >30 on admission -Has resolved -Renvela discontinued by nephrology  Severe sepsis secondary to urinary tract infection -Present on admission -Patient presented with encephalopathy as well as hypothermia and leukocytosis -Blood cultures were negative x2 -Urine culture from 12/31 and 1/1 showed multiple  species -CT renal stone study 12/31: Bladder wall appeared slightly thickened.  No significant fluid collection in pelvis -Patient completed 7 days of  IV cefepime -Sepsis has resolved  Anemia of chronic disease -Patient did receive 2 units PRBC -Last hemoglobin 8.6, continue to monitor intermittently  Acute metabolic/uremic encephalopathy -Encephalopathy likely multifactorial including uremia, infection -Mental status has improved and patient does appear to be at baseline  History of pelvic abscess -Noted on 11/15/2019, status post left transgluteal drain by IR/colovesicular fistula -Plan for colonoscopy and surgery later this month -Patient supposed to follow-up with gastroenterology, Dr. Rush Landmark  Asymptomatic sinus bradycardia -Avoid rate limiting medications -TSH 1.382  Chronic pain -Judicious and avoid opiates unless absolutely needed -Discussed with nephrology and pharmacy, ordered low-dose Zanaflex    History of CVA -July 2021, history of atrial fibrillation, patient is not a candidate for anticoagulation due to GI bleed and thrombocytopenia -Currently no focal deficits  PTSD -Sedatives held  History of gastric bypass   History of Hodgkin's lymphoma -Status post chemotherapy -Continue to follow-up outpatient  Goals of care -Palliative care consulted and appreciated -Patient full code and refused to discuss any decisions regarding plan of care -Patient should follow up with palliative care as an outpatient  Severe malnutrition -In the setting of chronic illness -Continue supplements  Physical deconditioning -PT evaluated and recommended SNF placement however patient has declined.  He is also declined to work with PT further due to pain and fatigue -Discussed with patient this morning he initially refused SNF placement and wanted to return home however today he is open to returning to SNF at [place] made aware  DVT Prophylaxis Heparin  Code Status:  Full  Family Communication: None at bedside  Disposition Plan:  Status is: Inpatient  Remains inpatient appropriate because:Unsafe d/c plan.  Patient has been medically optimized and stable however now pending SNF placement.   Dispo: The patient is from: Home              Anticipated d/c is to: SNF              Anticipated d/c date is: 2 days              Patient currently is medically stable to d/c.   Consultants Nephrology Interventional radiology Palliative care  Procedures  As above  Antibiotics   Anti-infectives (From admission, onward)   Start     Dose/Rate Route Frequency Ordered Stop   04/03/20 1600  ceFEPIme (MAXIPIME) 1 g in sodium chloride 0.9 % 100 mL IVPB        1 g 200 mL/hr over 30 Minutes Intravenous Every 24 hours 04/03/20 0918 04/07/20 1108   04/01/20 1600  ceFEPIme (MAXIPIME) 1 g in sodium chloride 0.9 % 100 mL IVPB  Status:  Discontinued        1 g 200 mL/hr over 30 Minutes Intravenous Every 24 hours 03/31/20 1550 04/03/20 0918   04/01/20 1145  cefTRIAXone (ROCEPHIN) 1 g in sodium chloride 0.9 % 100 mL IVPB        1 g 200 mL/hr over 30 Minutes Intravenous  Once 04/01/20 1057 04/01/20 1308   03/31/20 1415  ceFEPIme (MAXIPIME) 2 g in sodium chloride 0.9 % 100 mL IVPB        2 g 200 mL/hr over 30 Minutes Intravenous  Once 03/31/20 1407 03/31/20 1516      Subjective:   Chase [name] seen and examined today.  Patient more interactive this morning.  Feels Zanaflex has been helping him with his pain and soreness.  He denies current chest pain or shortness of breath, abdominal pain, dizziness  or headache.    Objective:   Vitals:   04/11/20 0411 04/11/20 0412 04/11/20 0418 04/11/20 1138  BP:   97/63   Pulse:   75   Resp: 11 (!) 9 18   Temp:   98.4 F (36.9 C) 97.8 F (36.6 C)  TempSrc:   Oral Oral  SpO2:   97%   Weight:   55.8 kg   Height:        Intake/Output Summary (Last 24 hours) at 04/11/2020 1230 Last data filed at 04/11/2020 0900 Gross  per 24 hour  Intake 720 ml  Output 1450 ml  Net -730 ml   Filed Weights   04/09/20 0400 04/10/20 0350 04/11/20 0418  Weight: 59.6 kg 56.7 kg 55.8 kg   Exam  General: Well developed, cachectic, chronically ill-appearing, NAD  HEENT: NCAT, mucous membranes moist.   Cardiovascular: S1 S2 auscultated, RRR.  Respiratory: Diminished however clear, no wheezing  Abdomen: Soft, nontender, nondistended, + bowel sounds  Extremities: warm dry without cyanosis clubbing or edema  Neuro: AAOx3, nonfocal  Skin: Without rashes exudates or nodules, Multiple tattoos  Psych: flat however appropriate   Data Reviewed: I have personally reviewed following labs and imaging studies  CBC: Recent Labs  Lab 04/04/20 2054 04/05/20 0158 04/06/20 0352 04/11/20 0334  WBC 10.2 9.1 7.9  --   NEUTROABS 8.2*  --   --   --   HGB 8.3* 8.2* 8.3* 8.6*  HCT 23.7* 24.0* 24.3* 27.8*  MCV 88.8 88.6 90.0  --   PLT 166 175 168  --    Basic Metabolic Panel: Recent Labs  Lab 04/07/20 0115 04/08/20 0422 04/09/20 0250 04/10/20 0321 04/11/20 0334  NA 133* 135 135 134* 136  K 4.1 4.6 4.4 5.2* 4.9  CL 102 105 105 106 104  CO2 20* 20* 19* 17* 22  GLUCOSE 111* 86 98 92 83  BUN 41* 39* 41* 45* 41*  CREATININE 4.86* 4.99* 5.09* 5.34* 5.08*  CALCIUM 6.9* 7.1* 7.3* 7.4* 7.7*   GFR: Estimated Creatinine Clearance: 12.8 mL/min (A) (by C-G formula based on SCr of 5.08 mg/dL (H)). Liver Function Tests: No results for input(s): AST, ALT, ALKPHOS, BILITOT, PROT, ALBUMIN in the last 168 hours. No results for input(s): LIPASE, AMYLASE in the last 168 hours. No results for input(s): AMMONIA in the last 168 hours. Coagulation Profile: No results for input(s): INR, PROTIME in the last 168 hours. Cardiac Enzymes: No results for input(s): CKTOTAL, CKMB, CKMBINDEX, TROPONINI in the last 168 hours. BNP (last 3 results) No results for input(s): PROBNP in the last 8760 hours. HbA1C: No results for input(s): HGBA1C  in the last 72 hours. CBG: Recent Labs  Lab 04/10/20 1149 04/10/20 1653 04/10/20 2004 04/11/20 0122 04/11/20 0417  GLUCAP 79 88 78 81 167*   Lipid Profile: No results for input(s): CHOL, HDL, LDLCALC, TRIG, CHOLHDL, LDLDIRECT in the last 72 hours. Thyroid Function Tests: No results for input(s): TSH, T4TOTAL, FREET4, T3FREE, THYROIDAB in the last 72 hours. Anemia Panel: Recent Labs    04/11/20 0334  FERRITIN 332  TIBC 132*  IRON 68   Urine analysis:    Component Value Date/Time   COLORURINE YELLOW 03/31/2020 1407   APPEARANCEUR CLOUDY (A) 03/31/2020 1407   LABSPEC 1.013 03/31/2020 1407   PHURINE 5.0 03/31/2020 1407   GLUCOSEU NEGATIVE 03/31/2020 1407   HGBUR MODERATE (A) 03/31/2020 1407   BILIRUBINUR NEGATIVE 03/31/2020 1407   KETONESUR 5 (A) 03/31/2020 1407   PROTEINUR >=300 (A) 03/31/2020  [place] 03/31/2020 1407   LEUKOCYTESUR LARGE (A) 03/31/2020 1407   Sepsis Labs: @LABRCNTIP (procalcitonin:4,lacticidven:4)  ) Recent Results (from the past 240 hour(s))  Urine culture     Status: Abnormal   Collection Time: 04/01/20  1:57 PM   Specimen: Urine, Random  Result Value Ref Range Status   Specimen Description URINE, RANDOM  Final   Special Requests   Final    LEFT KIDNEY Performed at [facility], [place] [address], [place], [place] [address]    Culture MULTIPLE SPECIES PRESENT, SUGGEST RECOLLECTION (A)  Final   Report Status 04/02/2020 FINAL  Final      Radiology Studies: No results found.   Scheduled Meds: . calcium carbonate  1 tablet Oral TID  . Chlorhexidine Gluconate Cloth  6 each Topical Daily  . DULoxetine  60 mg Oral Daily   And  . DULoxetine  30 mg Oral QHS  . feeding supplement  237 mL Oral BID BM  . heparin  5,000 Units Subcutaneous Q8H  . multivitamin with minerals  1 tablet Oral BID  . pantoprazole  40 mg Oral QHS  . sodium bicarbonate  1,300 mg Oral TID  . sodium chloride flush  5 mL Intracatheter Q8H  . tamsulosin   0.4 mg Oral QHS   Continuous Infusions:   LOS: 11 days   Time Spent in minutes   30 minutes  Ayano Douthitt D.O. on 04/11/2020 at 12:30 PM  Between 7am to 7pm - Please see pager noted on [email or web address]  After 7pm go to [email or web address]  And look for the night coverage person covering for me after hours  [facility] Office  [phone]

## 2020-04-11 NOTE — TOC Progression Note (Addendum)
Transition of Care (TOC) - Progression Note    Patient Details  Name: Chase TROSTLE Sr. MRN: [id number] Date of Birth: 08-12-1965  Transition of Care [facility]) CM/SW [place], [place] Phone Number: 04/11/2020, 1:25 PM  Clinical Narrative:     CSW called Daryll Brod with [facility] screening facilitator at [phone]; no answer left message requesting return call  CSW receives call from Roselee Nova at [place] and is informed pt is approved for [employer]. CSW to seek SNF with contract.    1610: CSW met with pt and explained [facility] is not available. CSW provided list of facilities that take [employer]. Pt will review with family. No offers yet  Expected Discharge Plan: [place] Barriers to Discharge: No SNF bed  Expected Discharge Plan and Services Expected Discharge Plan: [place] arrangements for the past 2 months: Apartment                                       Social Determinants of Health (SDOH) Interventions    Readmission Risk Interventions Readmission Risk Prevention Plan 12/25/2019 11/19/2019  Transportation Screening Complete Complete  Medication Review Press photographer) Complete Complete  PCP or Specialist appointment within 3-5 days of discharge - Complete  HRI or [place] - Complete  SW Recovery Care/Counseling Consult - Complete  [place] - Complete  Some recent data might be hidden

## 2020-04-11 NOTE — Progress Notes (Signed)
Nutrition Follow-up  DOCUMENTATION CODES:   Severe malnutrition in context of chronic illness  INTERVENTION:   Continue:  -MVI with minerals daily -500 mg calcium carbonate TID -Double protein portions with meals  Discontinue:  -Ensure Enlive po BID, each supplement provides 350 kcal and 20 grams of protein   NUTRITION DIAGNOSIS:   Severe Malnutrition related to chronic illness (colovesical fistula) as evidenced by moderate fat depletion,severe fat depletion,moderate muscle depletion,severe muscle depletion.  Ongoing  GOAL:   Patient will meet greater than or equal to 90% of their needs  Ongoing  MONITOR:   PO intake,Supplement acceptance,Diet advancement,Labs,Weight trends,Skin,I & O's  REASON FOR ASSESSMENT:   Malnutrition Screening Tool    ASSESSMENT:   55 year old male who presented to the ED on 12/31 after being found on the floor of his home with nephrostomy tubes out. PMH of gastric bypass surgery in 4196 with complications of anastomotic ulcer in 2014, Hodgkin's lymphoma s/p chemotherapy an radiation 2010-2011, colovesical fistula, bilateral ureteral stenosis with CKD and hydronephrosis, CVA in July 2021, PTSDK. Admitted with acute renal failure superimposed on CKD IV, metabolic acidosis, hydronephrosis, and severe sepsis secondary to UTI.   Pt was working with therapy at the time of RD visit. Deferred interview. Spoke with RN who reports patients appetite is varied. RN reports some days he will eat very good and others not very much. RN reports that pt refuses all Ensures. Per last RD visit, pt reports he is a very selective eater but is motivated to increase protein intake.   Last HD on 04/04/2019. Pt undecided on continuous HD plan. D/C'd Renvela by nephrology. Pt is now medically safe for discharge and considering SNF placement.   Weight at admission: 117 lbs. Current weight: 123 lbs.   Labs reviewed: CBG's x 24 hours: 78-167,   Medications reviewed and  include: Calcium carbonate, Aranesp, MVI with minerals, Protonix, Sodium bicarbonate  I/O's since admission: Net positive 229 mL   Diet Order:   Diet Order            Diet regular Room service appropriate? Yes; Fluid consistency: Thin  Diet effective now                 EDUCATION NEEDS:   Education needs have been addressed  Skin:  Skin Assessment: Skin Integrity Issues: Skin Integrity Issues:: Stage II,Other (Comment) Stage II: coccyx Other: puncture wound right buttocks  Last BM:  04/09/2020  Height:   Ht Readings from Last 1 Encounters:  03/31/20 5\' 6"  (1.676 m)    Weight:   Wt Readings from Last 1 Encounters:  04/11/20 55.8 kg     BMI:  Body mass index is 19.86 kg/m.  Estimated Nutritional Needs:   Kcal:  1900-2100  Protein:  120-135 grams  Fluid:  > 1.9 L    Ronnald Nian, Dietetic Intern Pager: [phone] If unavailable: [phone]

## 2020-04-11 NOTE — Consult Note (Signed)
[place] Nurse requested for preoperative stoma site marking  Discussed surgical procedure and stoma creation with patient and family.  Explained role of the [place] nurse team.  Provided the patient with educational booklet and provided samples of pouching options.  Answered patient and family questions.   Examined patient lying, sitting, and standing in order to place the marking in the patient's visual field, away from any creases or abdominal contour issues and within the rectus muscle.  Patient is very very thin, tried to avoid having rib cage fall over stoma site but patient has difficultly sitting up "states I can't breath".   Marked above belt line, he wears pant low   Marked for colostomy in the LLQ  __5__ cm to the left of the umbilicus and 1___cm above the umbilicus.  Marked for ileostomy in the RLQ  3__cm to the right of the umbilicus and  _4___ cm above/below the umbilicus.    Patient's abdomen cleansed with CHG wipes at site markings, allowed to air dry prior to marking.Covered mark with thin film transparent dressing to preserve mark until date of surgery.   [place] Nurse team will follow up with patient after surgery for continue ostomy care and teaching.  [place] MSN, [place], [place], [place]

## 2020-04-11 NOTE — Progress Notes (Signed)
Physical Therapy Treatment Patient Details Name: Chase GUST Sr. 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 He was admitted for acute renal failure complicating stage V CKD secondary to urinary obstruction and dehydration with severe electrolyte derangements.  Urology was consulted.  Patient underwent hemodialysis.    PT Comments    Pt agreeable to PT today; acknowledges that he is weaker than his baseline. Session focused on bed level exercises and transfer/pre gait training. Pt requiring min assist to transfer to standing x 2 from edge of bed. Pt reporting dizziness, BP 107/77. Recommending SNF to address deficits and maximize functional independence.    Follow Up Recommendations  SNF     Equipment Recommendations  None recommended by PT    Recommendations for Other Services       Precautions / Restrictions Precautions Precautions: Fall Precaution Comments: multiple nephrostomy bags and bulb Restrictions Weight Bearing Restrictions: No    Mobility  Bed Mobility Overal bed mobility: Needs Assistance Bed Mobility: Supine to Sit;Sit to Supine     Supine to sit: Supervision Sit to supine: Supervision   General bed mobility comments: supervision for safety, cues for technique  Transfers Overall transfer level: Needs assistance Equipment used: Rolling walker (2 wheeled) Transfers: Sit to/from Stand Sit to Stand: Min assist         General transfer comment: MinA to rise from edge of bed x 2 and march in place and side step towards head of bed. Cues for use of arms on walker and  upright posture. Pt with increased cervical flexion  Ambulation/Gait                 Stairs             Wheelchair Mobility    Modified Rankin (Stroke Patients Only)       Balance Overall balance assessment: Needs assistance Sitting-balance support: Feet supported Sitting balance-Leahy Scale: Fair     Standing balance support: Bilateral upper extremity supported Standing balance-Leahy Scale: Poor                              Cognition Arousal/Alertness: Awake/alert Behavior During Therapy: WFL for tasks assessed/performed Overall Cognitive Status: Impaired/Different from baseline Area of Impairment: Awareness                           Awareness: Emergent          Exercises General Exercises - Lower Extremity Long Arc Quad: Both;10 reps;Seated Hip Flexion/Marching: Both;10 reps;Seated Heel Raises: Both;10 reps;Seated    General Comments        Pertinent Vitals/Pain Pain Assessment: Faces Faces Pain Scale: Hurts little more Pain Location: generalized Pain Descriptors / Indicators: Aching;Guarding Pain Intervention(s): Monitored during session    Home Living                      Prior Function            PT Goals (current goals can now be found in the care plan section) Acute Rehab PT Goals Patient Stated Goal:  less pain Potential to Achieve Goals: Fair Progress towards PT goals: Progressing toward goals    Frequency    Min 2X/week      PT Plan Frequency needs to be updated    Co-evaluation              AM-PAC PT "6 Clicks" Mobility   Outcome Measure  Help needed turning from your back to your side while in a flat bed without using bedrails?: None Help needed moving from lying on your back to sitting on the side of a flat bed without using bedrails?: None Help needed moving to and from a bed to a chair (including a wheelchair)?: A Little Help needed standing up from a chair using your  arms (e.g., wheelchair or bedside chair)?: A Little Help needed to walk in hospital room?: A Lot Help needed climbing 3-5 steps with a railing? : A Lot 6 Click Score: 18    End of Session Equipment Utilized During Treatment: Gait belt Activity Tolerance: Patient tolerated treatment well Patient left: in bed;with call bell/phone within reach Nurse Communication: Mobility status;Patient requests pain meds PT Visit Diagnosis: Unsteadiness on feet (R26.81);Difficulty in walking, not elsewhere classified (R26.2);Muscle weakness (generalized) (M62.81)     Time: 9892-1194 PT Time Calculation (min) (ACUTE ONLY): 26 min  Charges:  $Therapeutic Exercise: 8-22 mins $Therapeutic Activity: 8-22 mins                     Wyona Almas, PT, DPT Acute Rehabilitation Services Pager [phone] Office [phone]    Deno Etienne 04/11/2020, 4:09 PM

## 2020-04-12 ENCOUNTER — Telehealth: Payer: Self-pay | Admitting: Gastroenterology

## 2020-04-12 DIAGNOSIS — R627 Adult failure to thrive: Secondary | ICD-10-CM | POA: Diagnosis not present

## 2020-04-12 DIAGNOSIS — N321 Vesicointestinal fistula: Secondary | ICD-10-CM | POA: Diagnosis not present

## 2020-04-12 DIAGNOSIS — E43 Unspecified severe protein-calorie malnutrition: Secondary | ICD-10-CM | POA: Diagnosis not present

## 2020-04-12 DIAGNOSIS — N170 Acute kidney failure with tubular necrosis: Secondary | ICD-10-CM | POA: Diagnosis not present

## 2020-04-12 LAB — BASIC METABOLIC PANEL
Anion gap: 11 (ref 5–15)
BUN: 42 mg/dL — ABNORMAL HIGH (ref 6–20)
CO2: 20 mmol/L — ABNORMAL LOW (ref 22–32)
Calcium: 7.7 mg/dL — ABNORMAL LOW (ref 8.9–10.3)
Chloride: 106 mmol/L (ref 98–111)
Creatinine, Ser: 5.15 mg/dL — ABNORMAL HIGH (ref 0.61–1.24)
GFR, Estimated: 12 mL/min — ABNORMAL LOW (ref >60)
Glucose, Bld: 82 mg/dL (ref 70–99)
Potassium: 4.8 mmol/L (ref 3.5–5.1)
Sodium: 137 mmol/L (ref 135–145)

## 2020-04-12 LAB — GLUCOSE, CAPILLARY
Glucose-Capillary: 76 mg/dL (ref 70–99)
Glucose-Capillary: 77 mg/dL (ref 70–99)
Glucose-Capillary: 86 mg/dL (ref 70–99)
Glucose-Capillary: 87 mg/dL (ref 70–99)
Glucose-Capillary: 89 mg/dL (ref 70–99)

## 2020-04-12 MED ORDER — ENOXAPARIN SODIUM 40 MG/0.4ML ~~LOC~~ SOLN: 40.0000 mg | Freq: Once | SUBCUTANEOUS | Status: DC

## 2020-04-12 MED ORDER — ACETAMINOPHEN 500 MG PO TABS: 1000.0000 mg | ORAL_TABLET | ORAL | Status: AC

## 2020-04-12 MED ORDER — BUPIVACAINE LIPOSOME 1.3 % IJ SUSP
20.0000 mL | Freq: Once | INTRAMUSCULAR | Status: DC
  Filled 2020-04-12: qty 20

## 2020-04-12 MED ORDER — ENSURE PRE-SURGERY PO LIQD
296.0000 mL | Freq: Once | ORAL | Status: DC
  Filled 2020-04-12: qty 296

## 2020-04-12 MED ORDER — GABAPENTIN 300 MG PO CAPS: 300.0000 mg | ORAL_CAPSULE | ORAL | Status: AC

## 2020-04-12 MED ORDER — NEOMYCIN SULFATE 500 MG PO TABS
1000.0000 mg | ORAL_TABLET | ORAL | Status: AC
  Administered 2020-04-12 – 2020-04-13 (×2): 1000 mg via ORAL
  Filled 2020-04-12 (×3): qty 2

## 2020-04-12 MED ORDER — POLYETHYLENE GLYCOL 3350 17 G PO PACK
17.0000 g | PACK | Freq: Two times a day (BID) | ORAL | Status: DC
  Administered 2020-04-12: 17 g via ORAL
  Filled 2020-04-12 (×2): qty 1

## 2020-04-12 MED ORDER — METRONIDAZOLE 500 MG PO TABS
1000.0000 mg | ORAL_TABLET | ORAL | Status: AC
  Administered 2020-04-12 – 2020-04-13 (×2): 1000 mg via ORAL
  Filled 2020-04-12 (×2): qty 2

## 2020-04-12 MED ORDER — SODIUM CHLORIDE 0.9 % IV SOLN: 2.0000 g | INTRAVENOUS | Status: AC

## 2020-04-12 MED ORDER — POLYETHYLENE GLYCOL 3350 17 G PO PACK: 17.0000 g | PACK | Freq: Two times a day (BID) | ORAL | Status: DC

## 2020-04-12 MED ORDER — POLYETHYLENE GLYCOL 3350 17 G PO PACK
34.0000 g | PACK | Freq: Every day | ORAL | Status: DC
  Administered 2020-04-12: 34 g via ORAL
  Filled 2020-04-12 (×2): qty 2

## 2020-04-12 MED ORDER — POLYETHYLENE GLYCOL 3350 17 G PO PACK: 17.0000 g | PACK | Freq: Two times a day (BID) | ORAL | Status: DC | PRN

## 2020-04-12 MED ORDER — BISACODYL 5 MG PO TBEC
20.0000 mg | DELAYED_RELEASE_TABLET | Freq: Once | ORAL | Status: AC
  Administered 2020-04-12: 20 mg via ORAL
  Filled 2020-04-12: qty 4

## 2020-04-12 MED ORDER — ENSURE PRE-SURGERY PO LIQD
592.0000 mL | Freq: Once | ORAL | Status: DC
  Filled 2020-04-12: qty 592

## 2020-04-12 MED ORDER — ALVIMOPAN 12 MG PO CAPS
12.0000 mg | ORAL_CAPSULE | ORAL | Status: AC
  Filled 2020-04-12: qty 1

## 2020-04-12 MED ORDER — POLYETHYLENE GLYCOL 3350 17 G PO PACK: 34.0000 g | PACK | Freq: Once | ORAL | Status: DC

## 2020-04-12 NOTE — TOC Progression Note (Signed)
Transition of Care (TOC) - Progression Note    Patient Details  Name: Chase CORK Sr. MRN: [id number] Date of Birth: June 20, 1965  Transition of Care [facility]) CM/SW Contact  Curlene Labrum, RN Phone Number: 04/12/2020, 12:32 PM  Clinical Narrative:    Case management met with the patient at the bedside regarding transitions of care.  The patient lives at home alone with history of SNF placement at [facility]  He is currently active with home health through [facility].  Patient received COVID vaccine x 1 in the past at [facility] but refuses second dose of vaccine or the booster at this time.  The patient states that he had active infection for COVID 3 months ago..  The patient has bilateral nephrostomy tubes/ drains and JP drain on the left.  Both tubes are draining clear yellow urine to gravity nephrostomy bags.  [name] consulted on the patient today and patient is planned for surgery tomorrow for loop colostomy.  Patient is making urine and is not requiring HD treatments at this time.  Case management will continue to follow the patient for SNF placement when able, but patient is not medically ready for discharge.   Expected Discharge Plan: Skilled Nursing Facility Barriers to Discharge: No SNF bed  Expected Discharge Plan and Services Expected Discharge Plan: [place] arrangements for the past 2 months: Apartment                                       Social Determinants of Health (SDOH) Interventions    Readmission Risk Interventions Readmission Risk Prevention Plan 12/25/2019 11/19/2019  Transportation Screening Complete Complete  Medication Review Press photographer) Complete Complete  PCP or Specialist appointment within 3-5 days of discharge - Complete  HRI or [place] - Complete  SW Recovery Care/Counseling Consult - Complete  [place] - Complete   Some recent data might be hidden

## 2020-04-12 NOTE — Progress Notes (Addendum)
Patient stabilized from a renal standpoint although very significant CKD V disease.  Given his deconditioned state and worsening renal failure, I do not think it is wise to do a definitive major resection next week.  Therefore robotic colovesical takedown and low anterior resection planned for next Friday has been canceled.   Discussed with my colorectal partners and urology.  I think the patient could benefit from fecal diversion with diverting loop colostomy to help deviate the fecal stream away.  Give a chance for minimizing any urosepsis problems that have plagued him.  Give a chance for his bladder and bowels to recover.  Do not know if inflammation to go down enough where his percutaneous nephrostomies could be switched to internal stents to avoid future pulling out/stent drain problems.  That would allow things to have time to possibly heal and then plan a more definitive resection in the future if possible.  If not, at least he will be diverted and risk of urosepsis will be minimized.  Tentatively posted diverting loop colostomy tomorrow, Thursday 1/13.  Probable flexible sigmoidoscopy to confirm lumen is okay.  Plan discussion with the patient later today.  He is already marked.  Discussed with Dr. Alinda Money with urology.  He may use this as an opportunity to switch out his ureteral stents as well if possible  Patient may decline or proceed.  We will see.  Adin Hector, MD, FACS, MASCRS Gastrointestinal and Minimally Invasive Surgery  [facility] [address], [place], [place] [address] [phone] Fax [phone] Main/Paging  CONTACT INFORMATION: Weekday (9AM-5PM) concerns: Call [facility] main office at [phone] Weeknight (5PM-9AM) or Weekend/Holiday concerns: Check [email or web address] for General Surgery [facility] coverage (Please, do not use SecureChat as it is not reliable communication to operating surgeons for immediate patient care)

## 2020-04-12 NOTE — Progress Notes (Signed)
Note: Portions of this report may have been transcribed using voice recognition software. Every effort was made to ensure accuracy; however, inadvertent computerized transcription errors may be present.   Any transcriptional errors that result from this process are unintentional.              Chase SIVERSON Sr.  Feb 25, 1966 [id number]  Patient Care Team: [name] as PCP - General Raynelle Bring, MD as Consulting Physician (Urology) Michael Boston, MD as Consulting Physician (General Surgery) Kandace Blitz, MD as Referring Physician (Pain Medicine) Mignon Pine, DO as Consulting Physician (Infectious Diseases) Garvin Fila, MD as Consulting Physician (Neurology) Gatha Mayer, MD as Consulting Physician (Gastroenterology) Reesa Chew, MD as Consulting Physician (Nephrology)  The anatomy & physiology of the digestive tract was discussed.  The pathophysiology of  fistula between the bowel and bladder was discussed.  Natural history risks without surgery was discussed. I worked to give an overview of the disease and the frequent need to have multispecialty involvement.   I feel the risks of no intervention will lead to serious problems that outweigh the operative risks; therefore, I recommended surgery to treat the pathology.  Laparoscopic & open techniques for partial proctocolectomy with bladder repair were discussed.  Probable fecal diversion by ostomy was discussed.  We will work to preserve anal & pelvic floor function without sacrificing cure.  Need for prolonged bladder catheterization was discussed.  Urology to do cystoscopy & stent exchange.  Plan flex sigmoidoscopy as well  Risks such as bleeding, infection, abscess, leak, injury to other organs, need for repair of tissues / organs, recurrence with reoperation, possible ostomy, hernia, heart attack, death, and other risks were discussed.  I noted a good likelihood this will help address the problem.    Goals of post-operative recovery were discussed as well.  We will work to minimize complications.  An educational handout on the pathology was given as well.  Questions were answered.    The patient expresses understanding & wishes to proceed with surgery.  He understands it most likely will just be fecal diversion / diverting colostomy.  He has been marked.  See if pelvic drain can be removed/switched out.  Surgery planned for NOON tomorrow, 13 Jan.  Preop orders have been placed  Adin Hector, MD, FACS, MASCRS Gastrointestinal and Minimally Invasive Surgery  [facility] [address], [place], [place] [address] [phone] Fax [phone] Main/Paging  CONTACT INFORMATION: Weekday (9AM-5PM) concerns: Call [facility] main office at [phone] Weeknight (5PM-9AM) or Weekend/Holiday concerns: Check [email or web address] for General Surgery [facility] coverage (Please, do not use SecureChat as it is not reliable communication to operating surgeons for immediate patient care)       Patient Active Problem List   Diagnosis Date Noted  . 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 Acute renal failure superimposed on stage 5 chronic kidney disease, not on chronic dialysis ([place]) 08/24/2019    Priority: High  . 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 Pelvic abscess with colovescial fistula 11/15/2019    Priority: Medium  . Protein-calorie malnutrition, severe 10/23/2019    Priority: Medium  . CKD (chronic kidney disease), stage IV ([place]) 10/17/2019    Priority: Medium  . History of gastric bypass 08/24/2019    Priority: Medium  . History of CVA (cerebrovascular accident) 12/31/2019    Priority: Low  . Cerebral embolism with cerebral  infarction 10/18/2019    Priority: Low  . DNR (do not resuscitate) discussion   . Palliative care by specialist   . Adult failure to thrive   . Sepsis with acute renal  failure without septic shock ([place]) 03/31/2020  . Incisional hernia - epigastric 01/04/2020  . Chronic narcotic use 01/02/2020  . Anemia in chronic kidney disease 01/02/2020  . ATN (acute tubular necrosis) ([place]) 12/31/2019  . Candida UTI 12/31/2019  . Electrolyte abnormality 12/31/2019  . Increased anion gap metabolic acidosis 04/03/7251  . Microcytic anemia 12/31/2019  . ABLA (acute blood loss anemia) 12/31/2019  . Anxiety and depression 12/31/2019  . BMI 24.0-24.9, adult 12/31/2019  . AKI (acute kidney injury) ([place]) 12/24/2019  . E. coli UTI (urinary tract infection) 11/15/2019  . PTSD (post-traumatic stress disorder)   . Chemical exposure   . Cervical radiculopathy   . Septic shock ([place]) 11/08/2019  . Acute left-sided weakness 10/17/2019  . Chronic pain 10/17/2019  . Fall 10/17/2019  . Pseudomonas urinary tract infection 08/24/2019  . BPH (benign prostatic hyperplasia) 08/24/2019  . Anemia 08/24/2019  . Dyspnea 08/24/2019  . History of COVID-19 08/24/2019  . History of elevated PSA 08/24/2019  . Hyponatremia 08/24/2019  . Metabolic acidosis 66/44/0347  . Skin lesion of neck 08/24/2019  . Toxic metabolic encephalopathy 42/59/5638  . Cervical spondylosis without myelopathy 06/19/2016  . Neck pain 09/04/2015  . History of stomach ulcers 05/19/2013  . Male erectile dysfunction 05/19/2013  . Hodgkin lymphoma, unspecified, unspecified site ([place]) 12/10/2012  . Iron deficiency anemia 12/10/2012  . Pernicious anemia 12/10/2012  . Arthralgia of multiple sites 04/20/2012  . Postlaminectomy syndrome, not elsewhere classified 04/20/2012    Past Medical History:  Diagnosis Date  . Cervical radiculopathy    with left sided weakness  . Chemical exposure    service related injury  . Chronic pain   . CKD (chronic kidney disease), stage IV ([place]) 10/17/2019  . COVID-19   . GERD (gastroesophageal reflux disease)   . GIB (gastrointestinal bleeding)   . Heart attack ([place])   . History  of CVA (cerebrovascular accident) 12/31/2019  . 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 History of stomach ulcers 05/19/2013   Formatting of this note might be different from the original. Attributed to chronic ibuprofen use  . Hodgkin lymphoma, unspecified, unspecified site ([place]) 12/10/2012   Formatting of this note might be different from the original. In remission.  . Hypokalemia   . Hypomagnesemia   . PTSD (post-traumatic stress disorder)   . Renal disorder    CKD  . Stroke ([place])   . Urinary retention    due to bladder outlet obstruction    Past Surgical History:  Procedure Laterality Date  . COLONOSCOPY    . CYSTOSCOPY W/ URETERAL STENT PLACEMENT N/A 11/14/2019   Procedure: CYSTOSCOPY WITH RETROGRADE PYELOGRAM bilateral Wyvonnia Dusky STENT PLACEMENT left fulguration bladder . cystogram;  Surgeon: Raynelle Bring, MD;  Location: [facility];  Service: Urology;  Laterality: N/A;  . CYSTOSCOPY W/ URETERAL STENT PLACEMENT Bilateral 12/25/2019   Procedure: CYSTOSCOPY WITH bilateral  RETROGRADE PYELOGRAM/ leftURETERAL STENT PLACEMENT;  Surgeon: Lucas Mallow, MD;  Location: [facility];  Service: Urology;  Laterality: Bilateral;  . GASTRIC BYPASS    . HERNIA REPAIR    . IR FLUORO GUIDE CV LINE RIGHT  04/01/2020  . IR NEPHROSTOMY PLACEMENT LEFT  12/27/2019  . IR NEPHROSTOMY PLACEMENT LEFT  04/01/2020  . IR NEPHROSTOMY PLACEMENT RIGHT  11/16/2019  . IR NEPHROSTOMY  PLACEMENT RIGHT  12/27/2019  . IR NEPHROSTOMY PLACEMENT RIGHT  04/01/2020  . IR SINUS/FIST TUBE CHK-NON GI  12/03/2019  . IR URETERAL STENT PLACEMENT EXISTING ACCESS RIGHT  12/08/2019  . IR URETERAL STENT PLACEMENT EXISTING ACCESS RIGHT  01/26/2020  . IR US GUIDE VASC ACCESS RIGHT  04/01/2020    Social History   Socioeconomic History  . Marital status: Widowed    Spouse name: Not on file  . Number of children: Not on file  . Years of education: Not on file  . Highest education level: Not on file  Occupational History  . Not on file  Tobacco  Use  . Smoking status: Never Smoker  . Smokeless tobacco: Current User  Vaping Use  . Vaping Use: Never used  Substance and Sexual Activity  . Alcohol use: Never  . Drug use: Never  . Sexual activity: Not on file  Other Topics Concern  . Not on file  Social History Narrative   Widow, Civil Service fast streamer served in [employer] gets care through the [place] as well   2 sons   Lives alone, his mother has been helping him in 2021 with illnesses   Former smoker no alcohol  or drug use now   Does use smokeless tobacco   2 caffeinated beverages daily   Social Determinants of Radio broadcast assistant Strain: Not on file  Food Insecurity: Not on file  Transportation Needs: Not on file  Physical Activity: Not on file  Stress: Not on file  Social Connections: Not on file  Intimate Partner Violence: Not on file    Family History  Problem Relation Age of Onset  . Renal cancer Father        mets to liver and lungs and pancreas  . Heart disease Father   . Heart disease Paternal Grandfather   . Colon cancer Maternal Grandmother   . Colon cancer Paternal Uncle   . Stomach cancer Neg Hx   . Esophageal cancer Neg Hx   . Pancreatic cancer Neg Hx     Current Facility-Administered Medications  Medication Dose Route Frequency Provider Last Rate Last Admin  . acetaminophen (TYLENOL) tablet 650 mg  650 mg Oral Q6H PRN Opyd, Ilene Qua, MD   650 mg at 04/12/20 1024   Or  . acetaminophen (TYLENOL) suppository 650 mg  650 mg Rectal Q6H PRN Opyd, Ilene Qua, MD   650 mg at 04/03/20 0001  . [START ON 04/13/2020] acetaminophen (TYLENOL) tablet 1,000 mg  1,000 mg Oral On Call to OR Michael Boston, MD      . Derrill Memo ON 04/13/2020] alvimopan (ENTEREG) capsule 12 mg  12 mg Oral On Call to OR Michael Boston, MD      . Derrill Memo ON 04/13/2020] bupivacaine liposome (EXPAREL) 1.3 % injection 266 mg  20 mL Infiltration Once Michael Boston, MD      . calcium carbonate (TUMS - dosed in mg elemental calcium) chewable tablet  200 mg of elemental calcium  1 tablet Oral TID Vernell Leep D, MD   200 mg of elemental calcium at 04/12/20 2245  . [START ON 04/13/2020] cefoTEtan (CEFOTAN) 2 g in sodium chloride 0.9 % 100 mL IVPB  2 g Intravenous On Call to OR Michael Boston, MD      . Chlorhexidine Gluconate Cloth 2 % PADS 6 each  6 each Topical Daily Charlynne Cousins, MD   6 each at 04/12/20 1022  . DULoxetine (CYMBALTA) DR capsule 60 mg  60 mg Oral Daily Charlynne Cousins, MD   60 mg at 04/12/20 1021   And  . DULoxetine (CYMBALTA) DR capsule 30 mg  30 mg Oral QHS Charlynne Cousins, MD   30 mg at 04/12/20 2244  . [START ON 04/13/2020] feeding supplement (ENSURE PRE-SURGERY) liquid 296 mL  296 mL Oral Once Michael Boston, MD      . feeding supplement (ENSURE PRE-SURGERY) liquid 592 mL  592 mL Oral Once Michael Boston, MD      . Derrill Memo ON 04/13/2020] gabapentin (NEURONTIN) capsule 300 mg  300 mg Oral On Call to OR Michael Boston, MD      . heparin injection 5,000 Units  5,000 Units Subcutaneous Q8H Opyd, Ilene Qua, MD   5,000 Units at 04/12/20 2243  . iohexol (OMNIPAQUE) 300 MG/ML solution 50 mL  50 mL Per Tube Once PRN Aletta Edouard, MD      . neomycin [facility]) tablet 1,000 mg  1,000 mg Oral 3 times per day Michael Boston, MD   1,000 mg at 04/12/20 2243   And  . metroNIDAZOLE (FLAGYL) tablet 1,000 mg  1,000 mg Oral 3 times per day Michael Boston, MD   1,000 mg at 04/12/20 2245  . multivitamin with minerals tablet 1 tablet  1 tablet Oral BID Modena Jansky, MD   1 tablet at 04/12/20 2244  . ondansetron (ZOFRAN) tablet 4 mg  4 mg Oral Q6H PRN Opyd, Ilene Qua, MD       Or  . ondansetron (ZOFRAN) injection 4 mg  4 mg Intravenous Q6H PRN Opyd, Ilene Qua, MD   4 mg at 04/08/20 1347  . oxyCODONE-acetaminophen (PERCOCET/ROXICET) 5-325 MG per tablet 1-2 tablet  1-2 tablet Oral Q4H PRN Cristal Ford, DO   2 tablet at 04/12/20 2244  . pantoprazole (PROTONIX) EC tablet 40 mg  40 mg Oral QHS Charlynne Cousins, MD   40 mg  at 04/12/20 2245  . polyethylene glycol (MIRALAX / GLYCOLAX) packet 17 g  17 g Oral BID Donnamae Jude, RPH   17 g at 04/12/20 2259  . polyethylene glycol (MIRALAX / GLYCOLAX) packet 34 g  34 g Oral Daily Donnamae Jude, RPH   34 g at 04/12/20 1019  . sodium bicarbonate tablet 1,300 mg  1,300 mg Oral TID Gean Quint, MD   1,300 mg at 04/12/20 2245  . sodium chloride flush (NS) 0.9 % injection 5 mL  5 mL Intracatheter Q8H Aletta Edouard, MD   5 mL at 04/12/20 2247  . tamsulosin (FLOMAX) capsule 0.4 mg  0.4 mg Oral QHS Charlynne Cousins, MD   0.4 mg at 04/12/20 2245  . tiZANidine (ZANAFLEX) tablet 2 mg  2 mg Oral BID PRN Cristal Ford, DO   2 mg at 04/12/20 2246     Allergies  Allergen Reactions  . Nsaids Other (See Comments)    Bleeding GI Ulceration  . Ciprofloxacin Nausea And Vomiting    BP 129/70   Pulse 61   Temp 98.1 F (36.7 C) (Oral)   Resp 14   Ht 5\' 6"  (1.676 m)   Wt 55.6 kg   SpO2 100%   BMI 19.78 kg/m   IR Fluoro Guide CV Line Right  Result Date: 04/01/2020 CLINICAL DATA:  Renal failure and need for emergent hemodialysis. EXAM: NON-TUNNELED CENTRAL VENOUS HEMODIALYSIS CATHETER PLACEMENT WITH ULTRASOUND AND FLUOROSCOPIC GUIDANCE FLUOROSCOPY TIME:  30 seconds. PROCEDURE: The procedure, risks, benefits, and alternatives were explained to the patient's son. Questions  regarding the procedure were encouraged and answered. The patient's son understands and consents to the procedure. A time-out was performed prior to initiating the procedure. Ultrasound was used to confirm patency of the right internal jugular vein. The right neck and chest were prepped with chlorhexidine in a sterile fashion, and a sterile drape was applied covering the operative field. Maximum barrier sterile technique with sterile gowns and gloves were used for the procedure. Local anesthesia was provided with 1% lidocaine. After creating a small venotomy incision, a 21 gauge needle was advanced into the right  internal jugular vein under direct, real-time ultrasound guidance. Ultrasound image documentation was performed. After securing guidewire access venous access was dilated over a guidewire. A 16 cm, 13 French triple-lumen Mah[place]kar non tunneled hemodialysis catheter was advanced over the wire. Final catheter positioning was confirmed and documented with a fluoroscopic spot image. The catheter was aspirated, flushed with saline, and injected with appropriate volume heparin dwells. The catheter exit site was secured with 0-Prolene retention sutures. COMPLICATIONS: None.  No pneumothorax. FINDINGS: After catheter placement, the tip lies at the cavoatrial junction. The catheter aspirates normally and is ready for immediate use. IMPRESSION: Placement of non-tunneled central venous hemodialysis catheter via the right internal jugular vein. The catheter tip lies at the cavoatrial junction. The catheter is ready for immediate use. Electronically Signed   By: Aletta Edouard M.D.   On: 04/01/2020 14:26   IR US Guide Vasc Access Right  Result Date: 04/01/2020 CLINICAL DATA:  Renal failure and need for emergent hemodialysis. EXAM: NON-TUNNELED CENTRAL VENOUS HEMODIALYSIS CATHETER PLACEMENT WITH ULTRASOUND AND FLUOROSCOPIC GUIDANCE FLUOROSCOPY TIME:  30 seconds. PROCEDURE: The procedure, risks, benefits, and alternatives were explained to the patient's son. Questions regarding the procedure were encouraged and answered. The patient's son understands and consents to the procedure. A time-out was performed prior to initiating the procedure. Ultrasound was used to confirm patency of the right internal jugular vein. The right neck and chest were prepped with chlorhexidine in a sterile fashion, and a sterile drape was applied covering the operative field. Maximum barrier sterile technique with sterile gowns and gloves were used for the procedure. Local anesthesia was provided with 1% lidocaine. After creating a small venotomy  incision, a 21 gauge needle was advanced into the right internal jugular vein under direct, real-time ultrasound guidance. Ultrasound image documentation was performed. After securing guidewire access venous access was dilated over a guidewire. A 16 cm, 13 French triple-lumen Mahurkar non tunneled hemodialysis catheter was advanced over the wire. Final catheter positioning was confirmed and documented with a fluoroscopic spot image. The catheter was aspirated, flushed with saline, and injected with appropriate volume heparin dwells. The catheter exit site was secured with 0-Prolene retention sutures. COMPLICATIONS: None.  No pneumothorax. FINDINGS: After catheter placement, the tip lies at the cavoatrial junction. The catheter aspirates normally and is ready for immediate use. IMPRESSION: Placement of non-tunneled central venous hemodialysis catheter via the right internal jugular vein. The catheter tip lies at the cavoatrial junction. The catheter is ready for immediate use. Electronically Signed   By: Aletta Edouard M.D.   On: 04/01/2020 14:26   DG CHEST PORT 1 VIEW  Result Date: 04/02/2020 CLINICAL DATA:  Evaluate for pulmonary pathology. EXAM: PORTABLE CHEST 1 VIEW COMPARISON:  April 01, 2020 11:40 p.m. FINDINGS: The mediastinal contour and cardiac silhouette are stable. Right central venous line is identified with distal tip in the superior vena cava/right atrium. Consolidation of medial right lung base is identified. The  left lung is clear. The bony structures are stable. IMPRESSION: Right lung base pneumonia. Electronically Signed   By: Abelardo Diesel M.D.   On: 04/02/2020 08:21   DG Chest Port 1 View  Result Date: 04/01/2020 CLINICAL DATA:  Acute respiratory distress EXAM: PORTABLE CHEST 1 VIEW COMPARISON:  03/31/2020 FINDINGS: Elevation of the right hemidiaphragm again noted with associated right basilar atelectasis. Left lung clear. No pneumothorax or pleural effusion. Right internal jugular central  venous catheter tip noted at the superior cavoatrial junction. Cardiac size within normal limits. Pulmonary vascularity is normal. No acute bone abnormality. IMPRESSION: No active disease. Electronically Signed   By: Fidela Salisbury MD   On: 04/01/2020 23:48   DG Chest Port 1 View  Result Date: 03/31/2020 CLINICAL DATA:  Altered mental status, questionable sepsis, pending COVID test. EXAM: PORTABLE CHEST 1 VIEW COMPARISON:  Chest x-ray dated 11/08/2019. FINDINGS: Heart size and mediastinal contours are within normal limits. Lungs appear clear. No pleural effusion or pneumothorax is seen. Osseous structures about the chest are unremarkable. IMPRESSION: No active disease. No evidence of pneumonia. Electronically Signed   By: Franki Cabot M.D.   On: 03/31/2020 14:36   CT Renal Stone Study  Result Date: 03/31/2020 CLINICAL DATA:  Flank pain EXAM: CT ABDOMEN AND PELVIS WITHOUT CONTRAST TECHNIQUE: Multidetector CT imaging of the abdomen and pelvis was performed following the standard protocol without IV contrast. COMPARISON:  12/30/2019, 01/26/2020, 11/14/2019, 12/24/2019 FINDINGS: Lower chest: Lung bases demonstrate no acute consolidation or pleural effusion. Normal cardiac size. Hepatobiliary: Status post cholecystectomy. No focal hepatic abnormality. No biliary dilatation. Pancreas: Unremarkable. No pancreatic ductal dilatation or surrounding inflammatory changes. Spleen: Normal in size without focal abnormality. Adrenals/Urinary Tract: Adrenal glands within normal limits. Bilateral percutaneous nephrostomy catheters has been removed. Linear soft tissue tract is visible posteriorly. Bilateral ureteral stents are visualized, proximal pigtail on the right is within extrarenal pelvis, distal pigtail within the posterior bladder. Proximal pigtail of left ureteral stent is visible in the proximal left ureter, the distal portion of the stent is in the urinary bladder. Moderate bilateral hydronephrosis which is  increased compared to the previous CT. Multiple small foci of gas within the bilateral renal collecting systems which may be due to refluxed air from the bladder versus recent removal of percutaneous nephrostomy tubes. Moderate perinephric fat stranding. Foley catheter within the bladder. Small air within the urinary bladder which appears slightly thick-walled. Stomach/Bowel: The stomach is nonenlarged. Evidence of prior gastric bypass. No dilated small bowel. Large amount of stool in the colon. Negative appendix. No acute bowel wall thickening. Vascular/Lymphatic: Mild aortic atherosclerosis without aneurysm. No suspicious nodes. Reproductive: Slightly enlarged prostate Other: No free air or free fluid. Left trans gluteal percutaneous drainage catheter with pigtail visible in the left posterior pelvis. No significant fluid collections visible within the pelvis at this time. Musculoskeletal: No acute or suspicious osseous abnormality. IMPRESSION: 1. Bilateral percutaneous nephrostomy catheters have been removed. Interim finding of moderate bilateral hydronephrosis of extrarenal pelvises, increased compared to prior CT. Right ureteral stent in expected position. Proximal pigtail of the left ureteral stent is visible within the proximal left ureter. Foley catheter present within decompressed urinary bladder; bladder wall appears slightly thickened. 2. Left trans gluteal percutaneous drainage catheter with pigtail visible in the left posterior pelvis. No significant fluid collections visible within the pelvis at this time. 3. Large amount of stool in the colon, suggesting constipation. Aortic Atherosclerosis (ICD10-I70.0). Electronically Signed   By: Donavan Foil M.D.   On:  03/31/2020 16:32   IR NEPHROSTOMY PLACEMENT LEFT  Result Date: 04/01/2020 CLINICAL DATA:  History of chronic pelvic abscess with colovesical fistula and bilateral ureteral obstruction requiring prior bilateral percutaneous nephrostomy tube  placement. Status post prior left ureteral stent placement in September and right ureteral stent placement in October. Admitted with severe acute kidney injury superimposed on chronic kidney disease, lack of urine output and evidence of bilateral hydronephrosis by imaging. An indwelling right nephrostomy tube has been pulled out for unknown length of time. He now presents for new bilateral percutaneous nephrostomy tube placement for renal decompression to determine degree of recovery will renal function. EXAM: 1. LEFT PERCUTANEOUS NEPHROSTOMY TUBE PLACEMENT 2. RIGHT PERCUTANEOUS NEPHROSTOMY TUBE PLACEMENT COMPARISON:  CT of the abdomen and pelvis on 03/31/2020 ANESTHESIA/SEDATION: Moderate (conscious) sedation was employed during this procedure. A total of Versed 1.0 mg and Fentanyl 50 mcg was administered intravenously. Moderate Sedation Time: 27 minutes. The patient's level of consciousness and vital signs were monitored continuously by radiology nursing throughout the procedure under my direct supervision. CONTRAST:  15 mL Omnipaque 300 MEDICATIONS: 1 g IV Rocephin. Antibiotic was administered in an appropriate time frame prior to skin puncture. FLUOROSCOPY TIME:  1 minute and 54 seconds.  8.0 mGy. PROCEDURE: The procedure, risks, benefits, and alternatives were explained to the patient's son by telephone. Questions regarding the procedure were encouraged and answered. The patient's son understands and consents to the procedure. A time-out was performed prior to initiating the procedure. Bilateral flank regions were prepped with chlorhexidine in a sterile fashion, and a sterile drape was applied covering the operative field. A sterile gown and sterile gloves were used for the procedure. Local anesthesia was provided with 1% Lidocaine. Ultrasound was used to localize the left kidney. Under direct ultrasound guidance, a 21 gauge needle was advanced into the renal collecting system. Ultrasound image documentation  was performed. Aspiration of urine sample was performed followed by contrast injection under fluoroscopy. A transitional dilator was advanced over a guidewire. Percutaneous tract dilatation was then performed over the guidewire. A 10-French percutaneous nephrostomy tube was then advanced and formed in the left renal collecting system. Additional urine sample was aspirated and sent for culture analysis. Catheter position was confirmed by fluoroscopy after contrast injection. Ultrasound was then used to localize the right kidney. A 21 gauge needle was advanced under ultrasound guidance into the right renal collecting system and ultrasound image documentation performed. After aspiration of urine, contrast was injected under fluoroscopy. A transitional dilator was advanced over a guidewire under fluoroscopy. The tract was dilated and a 10 French percutaneous nephrostomy tube advanced and formed in the right renal collecting system. Catheter position was confirmed by fluoroscopy after contrast injection. Both nephrostomy tubes were secured at the skin with Prolene retention sutures and Stat-Lock devices. Both were connected to gravity drainage bags. COMPLICATIONS: None. FINDINGS: Ultrasound confirms the presence of significant hydronephrosis bilaterally. After access of the left renal collecting system and placement of a nephrostomy tube formed in the renal pelvis, there is return of grossly purulent appearing urine. A sample of purulent fluid was sent for culture analysis. After placement of the right nephrostomy tube which was also formed in the renal pelvis, there is return of some blood tinged urine. IMPRESSION: Placement of bilateral 10 French percutaneous nephrostomy tubes formed at the level of the renal pelvis. Urine from the left kidney was grossly purulent and a sample sent for culture analysis. Both nephrostomy tubes were connected to gravity drainage bags. Electronically Signed  By: Aletta Edouard M.D.    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 Status post prior left ureteral stent placement in September and right ureteral stent placement in October. Admitted with severe acute kidney injury superimposed on chronic kidney disease, lack of urine output and evidence of bilateral hydronephrosis by imaging. An indwelling right nephrostomy tube has been pulled out for unknown length of time. He now presents for new bilateral percutaneous nephrostomy tube placement for renal decompression to determine degree of recovery will renal function. EXAM: 1. LEFT PERCUTANEOUS NEPHROSTOMY TUBE PLACEMENT 2. RIGHT PERCUTANEOUS NEPHROSTOMY TUBE PLACEMENT COMPARISON:  CT of the abdomen and pelvis on 03/31/2020 ANESTHESIA/SEDATION: Moderate (conscious) sedation was employed during this procedure. A total of Versed 1.0 mg and Fentanyl 50 mcg was administered intravenously. Moderate Sedation Time: 27 minutes. The patient's level of consciousness and vital signs were monitored continuously by radiology nursing throughout the procedure under my direct supervision. CONTRAST:  15 mL Omnipaque 300 MEDICATIONS: 1 g IV Rocephin. Antibiotic was administered in an appropriate time frame prior to skin puncture. FLUOROSCOPY TIME:  1 minute and 54 seconds.  8.0 mGy. PROCEDURE: The procedure, risks, benefits, and alternatives were explained to the patient's son by telephone. Questions regarding the procedure were encouraged and answered. The patient's son understands and consents to the procedure. A time-out was performed prior to initiating the procedure. Bilateral flank regions were prepped with chlorhexidine in a sterile fashion, and a sterile drape was applied covering the operative field. A sterile gown and sterile gloves were used  for the procedure. Local anesthesia was provided with 1% Lidocaine. Ultrasound was used to localize the left kidney. Under direct ultrasound guidance, a 21 gauge needle was advanced into the renal collecting system. Ultrasound image documentation was performed. Aspiration of urine sample was performed followed by contrast injection under fluoroscopy. A transitional dilator was advanced over a guidewire. Percutaneous tract dilatation was then performed over the guidewire. A 10-French percutaneous nephrostomy tube was then advanced and formed in the left renal collecting system. Additional urine sample was aspirated and sent for culture analysis. Catheter position was confirmed by fluoroscopy after contrast injection. Ultrasound was then used to localize the right kidney. A 21 gauge needle was advanced under ultrasound guidance into the right renal collecting system and ultrasound image documentation performed. After aspiration of urine, contrast was injected under fluoroscopy. A transitional dilator was advanced over a guidewire under fluoroscopy. The tract was dilated and a 10 French percutaneous nephrostomy tube advanced and formed in the right renal collecting system. Catheter position was confirmed by fluoroscopy after contrast injection. Both nephrostomy tubes were secured at the skin with Prolene retention sutures and Stat-Lock devices. Both were connected to gravity drainage bags. COMPLICATIONS: None. FINDINGS: Ultrasound confirms the presence of significant hydronephrosis bilaterally. After access of the left renal collecting system and placement of a nephrostomy tube formed in the renal pelvis, there is return of grossly purulent appearing urine. A sample of purulent fluid was sent for culture analysis. After placement of the right nephrostomy tube which was also formed in the renal pelvis, there is return of some blood tinged urine. IMPRESSION: Placement of bilateral 10 French percutaneous nephrostomy tubes  formed at the level of the renal pelvis. Urine from the left kidney was grossly purulent and a sample sent for culture analysis. Both nephrostomy tubes  were connected to gravity drainage bags. Electronically Signed   By: Aletta Edouard M.D.   On: 04/01/2020 14:20   Adin Hector, MD, FACS, MASCRS Gastrointestinal and Minimally Invasive Surgery  [facility] [address], [place], [place] [address] [phone] Fax [phone] Main/Paging  CONTACT INFORMATION: Weekday (9AM-5PM) concerns: Call [facility] main office at [phone] Weeknight (5PM-9AM) or Weekend/Holiday concerns: Check [email or web address] for General Surgery [facility] coverage (Please, do not use SecureChat as it is not reliable communication to operating surgeons for immediate patient care)

## 2020-04-12 NOTE — Progress Notes (Signed)
[facility] PROGRESS NOTE  Chase [name] OIN:[id number] DOB: 03-20-1966 DOA: 03/31/2020 PCP: [name]  Status: Remains inpatient appropriate because:Unsafe d/c plan and Inpatient level of care appropriate due to severity of illness   Dispo: The patient is from: Home              Anticipated d/c is to: SNF              Anticipated d/c date is: > 3 days              Patient currently is not medically stable to d/c. Barriers to discharge: Needs to undergo diverting loop colostomy, PT recommending SNF at present we have not received any bed offers from [place] affiliated facilities   Code Status:  Family Communication:  DVT prophylaxis:  Vaccination status:  Foley catheter: No but does have bilateral percutaneous nephrostomy tubes  HPI: HPI on 03/31/2020 by Dr. 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 Patient was reportedly seen by neighbors yesterday in a recliner and was then found on the floor today obtunded. He was sent to [facility] emergency department and is unable to provide history due to his clinical condition.  Interim history Admitted with acute renal failure on chronic kidney disease, stage V secondary to urinary obstruction and dehydration with electrolyte abnormalities.  Nephrology consulted.  Patient did undergo hemodialysis.  IR placed bilateral percutaneous nephrostomy tubes.  Creatinine did improve with a  couple cycles of hemodialysis however plateaued.  Hemodialysis catheter was then removed.  No further plans for hemodialysis at this time.  Nephrology feels that patient can be discharged home with outpatient follow-up.  At this time, discharge is complicated by patient being extremely weak and deconditioned.  Initially patient did not want to be discharged to SNF however this morning he states that he will consider it.   Subjective: Patient alert without any specific complaints. States would probably eat more if the food tasted better. No complaints verbalized  Objective: Vitals:   04/12/20 1000 04/12/20 1100  BP: 107/66   Pulse:    Resp: (!) 8 10  Temp:    SpO2:      Intake/Output Summary (Last 24 hours) at 04/12/2020 1222 Last data filed at 04/12/2020 1100 Gross per 24 hour  Intake 723 ml  Output 650 ml  Net 73 ml   Filed Weights   04/10/20 0350 04/11/20 0418 04/12/20 0406  Weight: 56.7 kg 55.8 kg 55.6 kg    Exam:  Constitutional: NAD, calm, comfortable-very cachectic in appearance Respiratory: clear to auscultation bilaterally, no wheezing, no crackles. Normal respiratory effort. No accessory muscle use.  Cardiovascular: Regular rate and rhythm, no murmurs / rubs / gallops. No extremity edema. 2+ pedal pulses. No carotid bruits.  Abdomen: no tenderness, no masses palpated. No hepatosplenomegaly. Bowel sounds positive. JP drain to left abdomen with opaque fluid noted. Genitourinary: Bilateral percutaneous nephrostomy tubes in place draining clear yellow urine to collection bag; also has chronic Foley catheter draining yellow urine to bedside bag Musculoskeletal: no clubbing / cyanosis. No  joint deformity upper and lower extremities. Good ROM, no contractures. Normal muscle tone.  Skin: no rashes, lesions, ulcers. No induration-overall pale in appearance Neurologic: CN 2-12 grossly intact. Sensation intact, DTR normal. Strength 5/5 x all 4 extremities.  Psychiatric: Normal  judgment and insight. Alert and oriented x 3. Normal mood.    Assessment/Plan: Acute problems: Acute renal failure on chronic kidney disease, stage V -Nephrology consulted and appreciated -Creatinine 5.76 in October 2021, although patient presented with a creatinine of 16.69 -Chronic kidney disease likely secondary to longstanding obstruction.  Acute kidney injury due to dehydration and sepsis along with ATN with obstruction. -Patient also noted to have severe electrolyte derangements with severe acidosis and likely uremia on presentation. -Interventional radiology consulted and appreciated for bilateral percutaneous nephrostomy tube placement, 04/01/2020 as well as temporary HD catheter placement. -Patient did undergo hemodialysis, 2 cycles with last hemodialysis on 04/04/2019. -Creatinine has now plateaued, currently 5.08 -Though creatinine is still elevated, it is below his baseline.  Patient himself is unsure about wanting continuous HD.  Nephrology recommended that patient follow-up with nephrology as an outpatient.  Recommended continuing bicarb on discharge.  Bilateral hydronephrosis -Per IR follow-up on 04/07/2019, patient had urinary obstruction secondary to pelvic mass with subsequent bilateral hydronephrosis in the setting of bilateral ureteral stents, bilateral PCNs inadvertently removed (unknown how long they have been out), status post bilateral PCN placement on 04/01/2020. -Patient will need to follow-up with interventional radiology as well as urology -He also has a chronic Foley catheter as well. -Also has history of Hodgkin's lymphoma and is status postchemotherapy and radiation which may have also contributed to recurrent obstructive renal disease  History of pelvic abscess/known colovesical fistula -Noted on 11/15/2019, status post left transgluteal drain by IR/colovesicular fistula -The surgical team has reevaluated patient on 1/12 and plan is to proceed with diverting loop  colostomy on 1/13. Please see their note for details -Patient supposed to follow-up with gastroenterology, Dr. Rush Landmark  Severe physical deconditioning, history of prior CVA -July 2021, history of atrial fibrillation, patient is not a candidate for anticoagulation due to GI bleed and thrombocytopenia -Currently no focal deficits  Severe protein calorie malnutrition Nutrition Problem: Severe Malnutrition Etiology: chronic illness (colovesical fistula) Signs/Symptoms: moderate fat depletion,severe fat depletion,moderate muscle depletion,severe muscle depletion Interventions: MVI,Refer to RD note for recommendations Estimated body mass index is 19.78 kg/m as calculated from the following:   Height as of this encounter: 5\' 6"  (1.676 m).   Weight as of this encounter: 55.6 kg.  Chronic pain -Judicious and avoid opiates unless absolutely needed -Discussed with nephrology and pharmacy, ordered low-dose Zanaflex   Other problems: Severe dehydration with multiple metabolic derangements as follows: Severe metabolic acidosis, hyperkalemia, hyperphosphatemia -Mild after rehydration, dialysis and Kayexalate  Severe sepsis secondary to urinary tract infection -Present on admission-sepsis physiology has now resolved -Patient presented with encephalopathy as well as hypothermia and leukocytosis -Blood cultures were negative x2 -Urine culture from 12/31 and 1/1 showed multiple species -CT renal stone study 12/31: Bladder wall appeared slightly thickened.  No significant fluid collection in pelvis -Patient completed 7 days of IV cefepime -Sepsis has resolved  Goals of care -Palliative care consulted and appreciated -Patient full code and refused to discuss any decisions regarding plan of care -Patient should follow up with palliative care as an outpatient  PTSD -Sedatives held  History of gastric bypass    Data Reviewed: Basic Metabolic Panel: Recent Labs  Lab 04/08/20 0422  04/09/20 0250 04/10/20 0321 04/11/20 0334 04/12/20 0335  NA 135 135 134* 136 137  K 4.6 4.4 5.2* 4.9 4.8  CL 105 105 106 104 106  CO2 20* 19* 17* 22 20*  GLUCOSE 86 98 92 83 82  BUN 39* 41* 45* 41* 42*  CREATININE 4.99* 5.09* 5.34* 5.08* 5.15*  CALCIUM 7.1* 7.3* 7.4* 7.7* 7.7*   Liver Function Tests: No results for input(s): AST, ALT, ALKPHOS, BILITOT, PROT, ALBUMIN in the last 168 hours. No results for input(s): LIPASE, AMYLASE in the last 168 hours. No results for input(s): AMMONIA in the last 168 hours. CBC: Recent Labs  Lab 04/06/20 0352 04/11/20 0334  WBC 7.9  --   HGB 8.3* 8.6*  HCT 24.3* 27.8*  MCV 90.0  --   PLT 168  --    Cardiac Enzymes: No results for input(s): CKTOTAL, CKMB, CKMBINDEX, TROPONINI in the last 168 hours. BNP (last 3 results) No results for input(s): BNP in the last 8760 hours.  ProBNP (last 3 results) No results for input(s): PROBNP in the last 8760 hours.  CBG: Recent Labs  Lab 04/11/20 1636 04/11/20 2028 04/12/20 0024 04/12/20 0405 04/12/20 0753  GLUCAP 93 123* 76 77 87    No results found for this or any previous visit (from the past 240 hour(s)).   Studies: No results found.  Scheduled Meds: . calcium carbonate  1 tablet Oral TID  . Chlorhexidine Gluconate Cloth  6 each Topical Daily  . DULoxetine  60 mg Oral Daily   And  . DULoxetine  30 mg Oral QHS  . heparin  5,000 Units Subcutaneous Q8H  . multivitamin with minerals  1 tablet Oral BID  . pantoprazole  40 mg Oral QHS  . polyethylene glycol  17 g Oral BID  . polyethylene glycol  34 g Oral Daily  . sodium bicarbonate  1,300 mg Oral TID  . sodium chloride flush  5 mL Intracatheter Q8H  . tamsulosin  0.4 mg Oral QHS   Continuous Infusions:  Principal Problem:   Sepsis with acute renal failure without septic shock (HCC) Active Problems:   CKD (chronic kidney disease), stage IV (HCC)   Chronic pain   Protein-calorie malnutrition, severe   Colovesical fistula    Acute renal failure superimposed on stage 5 chronic kidney disease, not on chronic dialysis (HCC)   History of COVID-19   History of gastric bypass   Hodgkin lymphoma, unspecified, unspecified site (HCC)   Metabolic acidosis   Toxic metabolic encephalopathy   PTSD (post-traumatic stress disorder)   Bilateral ureteral obstruction s/p perc nephrosotmy & stenting   History of CVA (cerebrovascular accident)   DNR (do not resuscitate) discussion   Palliative care by specialist   Adult failure to thrive   Consultants:    Procedures:    Antibiotics: Anti-infectives (From admission, onward)   Start     Dose/Rate Route Frequency Ordered Stop   04/03/20 1600  ceFEPIme (MAXIPIME) 1 g in sodium chloride 0.9 % 100 mL IVPB        1 g 200 mL/hr over 30 Minutes Intravenous Every 24 hours 04/03/20 0918 04/07/20 1108   04/01/20 1600  ceFEPIme (MAXIPIME) 1 g in sodium chloride 0.9 % 100 mL IVPB  Status:  Discontinued        1 g 200 mL/hr over 30 Minutes Intravenous Every 24 hours 03/31/20 1550 04/03/20 0918   04/01/20 1145  cefTRIAXone (ROCEPHIN) 1 g in sodium chloride 0.9 % 100 mL IVPB        1 g 200  mL/hr over 30 Minutes Intravenous  Once 04/01/20 1057 04/01/20 1308   03/31/20 1415  ceFEPIme (MAXIPIME) 2 g in sodium chloride 0.9 % 100 mL IVPB        2 g 200 mL/hr over 30 Minutes Intravenous  Once 03/31/20 1407 03/31/20 1516        Time spent:  30 minutes    Erin Hearing ANP  [facility] 7 am - 330 pm/M-F for direct patient care and secure chat Please refer to Amion for contact info 12  days

## 2020-04-12 NOTE — Telephone Encounter (Signed)
Thank you for the update. I am forwarding this to the patient's primary gastroenterologist, Dr. Carlean Purl. Having read the chart and looks like there are considerations of palliative care as well also occurring. I will defer repeat/rescheduling of this patient's colonoscopy to Dr. Carlean Purl as well as the surgical service depending on if he is going to end up having surgery or not in the near future. FYI CG. Thanks. GM

## 2020-04-12 NOTE — Progress Notes (Signed)
[facility] Progress Note    Assessment/ Plan:   Assessment/Recommendations: Chase ERNSBERGER Sr. is a/an 55 y.o. male with a past medical history significant for gastric bypass, hodgkin's lymphoma, colovesical fistula, bilateral ureteral stenosis w/ ckd and hydronephrosiswho present w/ acute renal failure, sepsis, and hydronephrosis  Non-OliguricAKI on CKD V 2/2 urinary obstruction and dehydration w/ severe electrolyte derrangements.CKD secondary to longstanding obstruction. BL Crt around 5-6. AKI likely dehydration with and obstruction. S/p HD x2 now holding further treatments. Kidney function has settled out to baseline -renal diet -Nephrostomy tubes in place -of note, he has seen Dr. Royce Macadamia at our office in the past but has no showed numerous times thereafter. He will need to call to set up an appointment with either Dr. Joylene Grapes or Dr. Royce Macadamia once closer to discharge -Discussed this in detail with patient on 1/11 and understands that given his lack of functionality, physical deconditioning, and severe malnutrition that he will not be an ideal dialysis candidate especially given the risks associated with it, patient agrees that dialysis will not be in his best interest but wants to think about it more. He will need to be seen in our clinic for discussions to continue -appreciate palliative care's assistance  Bilateral hydronephrosis: History of such presenting with removal of his nephrostomy tubes and hydronephrosis. PCN in place working well   Sepsis: Likely urosepsis. improved  Severe metabolic acidosis: improved. On nahco3 1300mg  tid  Anemia of CKD: aranesp 53mcg 1/11  Will sign off from a nephrology perspective. He will need to call our office once closer to discharge to set up an appointment with either Dr. Joylene Grapes or Dr. Royce Macadamia. Please call with any questions/concerns. Thank you.  Gean Quint, MD [facility]   Subjective:    No acute  events, uop 0.9L. Considering SNF, palliative on board. No complaints. Wants to sleep   Objective:   BP 107/66   Pulse (!) 57   Temp 97.8 F (36.6 C) (Oral)   Resp 10   Ht 5\' 6"  (1.676 m)   Wt 55.6 kg   SpO2 100%   BMI 19.78 kg/m   Intake/Output Summary (Last 24 hours) at 04/12/2020 1246 Last data filed at 04/12/2020 1100 Gross per 24 hour  Intake 723 ml  Output 650 ml  Net 73 ml   Weight change: -0.2 kg  Physical Exam: Gen: NAD, lying in bed, frail and pale CVS: RRR Resp: bilateral chest rise, no iwob Abd: soft, nd GU: bilateral PCN tubes in place, draining yellow urine Ext: no LE edema Neuro: no asterixis, speech clear and coherent   Imaging: No results found.  Labs: BMET Recent Labs  Lab 04/06/20 0352 04/07/20 0115 04/08/20 0422 04/09/20 0250 04/10/20 0321 04/11/20 0334 04/12/20 0335  NA 133* 133* 135 135 134* 136 137  K 4.4 4.1 4.6 4.4 5.2* 4.9 4.8  CL 101 102 105 105 106 104 106  CO2 21* 20* 20* 19* 17* 22 20*  GLUCOSE 87 111* 86 98 92 83 82  BUN 39* 41* 39* 41* 45* 41* 42*  CREATININE 4.74* 4.86* 4.99* 5.09* 5.34* 5.08* 5.15*  CALCIUM 6.8* 6.9* 7.1* 7.3* 7.4* 7.7* 7.7*   CBC Recent Labs  Lab 04/06/20 0352 04/11/20 0334  WBC 7.9  --   HGB 8.3* 8.6*  HCT 24.3* 27.8*  MCV 90.0  --   PLT 168  --     Medications:    . calcium carbonate  1 tablet Oral TID  . Chlorhexidine Gluconate  Cloth  6 each Topical Daily  . DULoxetine  60 mg Oral Daily   And  . DULoxetine  30 mg Oral QHS  . heparin  5,000 Units Subcutaneous Q8H  . multivitamin with minerals  1 tablet Oral BID  . pantoprazole  40 mg Oral QHS  . polyethylene glycol  17 g Oral BID  . polyethylene glycol  34 g Oral Daily  . sodium bicarbonate  1,300 mg Oral TID  . sodium chloride flush  5 mL Intracatheter Q8H  . tamsulosin  0.4 mg Oral QHS     Chase [name]  04/12/2020, 12:46 PM

## 2020-04-12 NOTE — Telephone Encounter (Signed)
Hi Dr. Rush Landmark, [facility] just called to cancelled pt's procedure for 04/20/20 because he is currently hospitalized at [facility]. Thank you.

## 2020-04-12 NOTE — Progress Notes (Signed)
Pt was seen and examined. Agree with assessment and plan as outlined by Erin Hearing, NP. Briefly, pt presented with acute renal failure, Nephrology following. Pt did require HD treatments in-house. Renal function now seems to be leveling off. Now plan for SNF placement

## 2020-04-13 ENCOUNTER — Encounter (HOSPITAL_COMMUNITY): Payer: Self-pay | Admitting: Anesthesiology

## 2020-04-13 ENCOUNTER — Encounter: Payer: No Typology Code available for payment source | Admitting: Gastroenterology

## 2020-04-13 ENCOUNTER — Encounter (HOSPITAL_COMMUNITY): Payer: Self-pay | Admitting: Family Medicine

## 2020-04-13 ENCOUNTER — Encounter: Payer: No Typology Code available for payment source | Admitting: Internal Medicine

## 2020-04-13 ENCOUNTER — Encounter (HOSPITAL_COMMUNITY)
Admission: EM | Disposition: A | Discharge status: Home Health | Payer: Self-pay | Source: Home / Self Care | Attending: Internal Medicine

## 2020-04-13 DIAGNOSIS — Z7189 Other specified counseling: Secondary | ICD-10-CM | POA: Diagnosis not present

## 2020-04-13 DIAGNOSIS — G8929 Other chronic pain: Secondary | ICD-10-CM | POA: Diagnosis not present

## 2020-04-13 DIAGNOSIS — R531 Weakness: Secondary | ICD-10-CM | POA: Diagnosis not present

## 2020-04-13 DIAGNOSIS — R627 Adult failure to thrive: Secondary | ICD-10-CM | POA: Diagnosis not present

## 2020-04-13 DIAGNOSIS — N135 Crossing vessel and stricture of ureter without hydronephrosis: Secondary | ICD-10-CM | POA: Diagnosis not present

## 2020-04-13 DIAGNOSIS — N179 Acute kidney failure, unspecified: Secondary | ICD-10-CM | POA: Diagnosis not present

## 2020-04-13 LAB — GLUCOSE, CAPILLARY
Glucose-Capillary: 73 mg/dL (ref 70–99)
Glucose-Capillary: 96 mg/dL (ref 70–99)
Glucose-Capillary: 85 mg/dL (ref 70–99)
Glucose-Capillary: 81 mg/dL (ref 70–99)

## 2020-04-13 LAB — SURGICAL PCR SCREEN
MRSA, PCR: NEGATIVE
Staphylococcus aureus: NEGATIVE

## 2020-04-13 SURGERY — DIVERTING ILEOSTOMY
Anesthesia: General

## 2020-04-13 MED ORDER — POLYETHYLENE GLYCOL 3350 17 G PO PACK: 34.0000 g | PACK | Freq: Once | ORAL | Status: DC

## 2020-04-13 MED ORDER — POLYETHYLENE GLYCOL 3350 17 G PO PACK: 34.0000 g | PACK | Freq: Every day | ORAL | Status: DC

## 2020-04-13 MED ORDER — POLYETHYLENE GLYCOL 3350 17 G PO PACK
34.0000 g | PACK | Freq: Once | ORAL | Status: DC
  Filled 2020-04-13 (×2): qty 2

## 2020-04-13 MED ORDER — BISACODYL 5 MG PO TBEC
20.0000 mg | DELAYED_RELEASE_TABLET | Freq: Once | ORAL | Status: DC
  Filled 2020-04-13 (×2): qty 4

## 2020-04-13 NOTE — Progress Notes (Signed)
Patient ID: Chase Picker., male   DOB: [birth year], 55 y.o.   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 Uroseptic  CT scan showed bilateral hydronephrosis with a left transgluteal drainage without fluid collection and a large colonic stool burden. Bilateral percutaneous nephrostomy tubes placed by IR 1/1. Plans for short term HD vs CRRT. Poor candidate for outpatient dialysis. PMT consulted to discuss [place].   Per Nephrology no need for dialysis at this time, temporary cath removed today. Patient is likely going to face decision regarding dialysis in the near future.   Creatinine remains elevated most recent 5.08  Today is day 13 of this hospital stay.  Patient has waffled on decisions regarding medical recommendations throughout this entire hospital stay. . Most recent is the plan for surgery today; as discussed with urology and surgery there was a planned surgery for cystoscopy and stents and diverging colostomy.  Patient tells me he is not in agreement to follow through with that surgery at this time.  I made treatment team aware via secure chat.  Of note patient has refused nursing intervention, medications and other therapies intermittently throughout his hospitalization.  Patient faces treatment option decisions, advanced directive decisions and anticipatory care needs.   Today's Discussion : 04-14-19  Visited patient at bedside.   Continued education regarding current  medical situation; diagnosis, prognosis, GOCs, and options.  On previous visits this week patient was disengaged and not willing to discuss his medical situation and decisions regarding his treatment plan.  Chase [name] tells me that he "just does not feel good about doing the surgery today, maybe I will consider it at another time".  Attempted to explore Chase [name] thoughts and feelings regarding his medical situation.  Education offered on the difference between aggressive medical intervention path and a palliative comfort path for this patient at this time in the situation. I assured Chase [name] that these decisions are his and his alone.  It is a viable option and choice to shift to a full comfort path, understanding the consequences  Education offered on hospice benefit both in the home, and the facility and residential.  Up to this point patient has been unable/unwilling to make any definitive decision regarding healthcare power of attorney or advanced care planning.   No questions or concerns at this time    Vital Signs Vitals:   04/12/20 2200 04/13/20 0628  BP:  101/68  Pulse:  76  Resp: 14 13  Temp:  98.1 F (36.7 C)  SpO2:      Intake/Output Summary (Last 24 hours) at 04/13/2020 0850 Last data filed at 04/13/2020 0700 Gross per 24 hour  Intake 0 ml  Output 1425 ml  Net -1425 ml   Last Weight  Most recent update: 04/13/2020  6:29 AM   Weight  55.7 kg (122 lb 12.7 oz)            Recommendations and Plan:  -Full code -Patient is declining surgery for today -Patient is still open and willing for SNF on discharge -PMT will continue to  support holistically, recommend team meeting to include attending and palliative medicine along with the patient and both of his sons for early next week.   Time Spent: 35 minutes Greater than 50% of the time was spent in counseling and coordination of  care ______________________________________________________________________________________ Wadie Lessen NP  [place] Team Team Cell Phone: [phone] Please utilize secure chat with additional questions, if there is no response within 30 minutes please call the above phone number  Palliative Medicine Team providers are available by phone from 7am to 7pm daily and can be reached through the team cell phone.  Should this patient require assistance outside of these hours, please call the patient's attending physician.

## 2020-04-13 NOTE — Progress Notes (Addendum)
Called by [facility] OR front desk.  They were hearing rumors that the patient had changed his mind and refused surgery.  I arrived in the patient's room.  Patient notes he is thinking about the anniversary of his wife's loss and his daughter's loss.  He called and discussed with his sons.  He does not feel like having colostomy surgery today.  He is open to the idea of perhaps doing cystoscopy and stents but wants to hold off on the diverting colostomy until maybe next week.  He also discussed about perhaps about following up after discharge.  I noted that he has been lost to follow-up twice.  I had met him in late August as he was recovering from his stroke with perforated diverticulitis and abscess and colovesical fistula..  Plan was for let him to continue rehab, get his diverticulitis under control and then do surgery.  He had had some kidney disease requiring stenting and ultimately percutaneous nephrostomy tubes.  However he was mobilizing and getting stronger after his stroke and was cleared by neurology.  He then was readmitted for other issues in October.  He turned around.  He wanted to wait on surgery to avoid a colostomy.  I reminded him that we had planned to do colovesical fistula takedown with immediate anastomosis November 30, but he did not arrive for a preop visit in our office or the hospital and we could not reach him.  He did go see gastroenterology for planned colonoscopy preop.  We then rescheduled colonoscopy 1/20 & surgery for 1/21 next week as a coordinated case with urology, Dr. Alinda Money.  When he did not show up in the office for a preop visit, we discovered that he had been he got readmitted for worsening kidney failure and failure to thrive New Years Eve.  There had been discussions earlier in this hospitalization about palliative care and possible hospice if he did not improve or needed permanent hemodialysis. While he has severe kidney disease, it seems to have stabilized and is  nonoliguric.  Got off hemodialysis.  He clinically stabilized and seemed to wish to be aggressive.  Given his readmission and failure to follow-up, I felt we needed surgery this hospitalization.  I felt it would be wiser to do a diverting colostomy in the hopes of letting his urinary system calm down and stabilize.  Possibly consider rectosigmoid resection and takedown of colovesical fistula once his nutrition status improved.  He was ready for surgery when discussed with him last night.  Dr. Alinda Money discussed with him this morning and patient was ready for surgery.   He was changed his mind again & refuses colostomy surgery today.   I again explained the reasoning of doing diverting colostomy to divert the fecal stream from his chronic colovesical fistula that has been giving him urosepsis and worsening renal failure/genitourinary issues.   Give the chance for his genitourinary issues to get under better control with the colovesical fistula diverted.  Break the cycle of urosepsis and readmissions.  He declines.  I discussed with Dr. Alinda Money.  Dr. Alinda Money does not feel it is wise to just do cystoscopy and urinary stent exchanges done right now.  Both of [place] agree that multiple general anesthesia events in this patient is not wise.  Therefore surgery is canceled today.  The patient wonders if we can wait until next week.  I do not know if the patient will be here next week.  Sounds like he is close to being discharged  to a skilled facility.  I am concerned given the fact that he is promised to show up for outpatient follow-up and has not done this twice that is unlikely he will do outpatient follow-up.  Hence trying to do surgery while he still an inpatient to break the cycles of readmissions, decline, & worsening kidney disease.  I do not know if Dr. Alinda Money with Urology & I will have free time next week to do this, but we could try.  Adin Hector, MD, FACS, MASCRS Gastrointestinal and Minimally Invasive  Surgery  [facility] [address], [place], [place] [address] [phone] Fax [phone] Main/Paging  CONTACT INFORMATION: Weekday (9AM-5PM) concerns: Call [facility] main office at [phone] Weeknight (5PM-9AM) or Weekend/Holiday concerns: Check [email or web address] for General Surgery [facility] coverage (Please, do not use SecureChat as it is not reliable communication to operating surgeons for immediate patient care)

## 2020-04-13 NOTE — Anesthesia Preprocedure Evaluation (Deleted)
Anesthesia Evaluation    Reviewed: Allergy & Precautions, Patient's Chart, lab work & pertinent test results  Airway        Dental   Pulmonary neg pulmonary ROS,           Cardiovascular + Past MI       Neuro/Psych PSYCHIATRIC DISORDERS Anxiety Depression  Neuromuscular disease CVA    GI/Hepatic Neg liver ROS, GERD  Medicated,  Endo/Other  negative endocrine ROS  Renal/GU CRFRenal disease     Musculoskeletal  (+) Arthritis ,   Abdominal   Peds  Hematology   Anesthesia Other Findings   Reproductive/Obstetrics                             Anesthesia Physical Anesthesia Plan  ASA: III  Anesthesia Plan: General   Post-op Pain Management:    Induction: Intravenous  PONV Risk Score and Plan: 3 and Ondansetron, Midazolam and Dexamethasone  Airway Management Planned: Oral ETT  Additional Equipment: None  Intra-op Plan:   Post-operative Plan: Post-operative intubation/ventilation  Informed Consent:   Plan Discussed with: CRNA  Anesthesia Plan Comments:         Anesthesia Quick Evaluation

## 2020-04-13 NOTE — Progress Notes (Addendum)
[facility] PROGRESS NOTE  Chase [name] OZD:[id number] DOB: Sep 26, 1965 DOA: 03/31/2020 PCP: [name]  Status: Remains inpatient appropriate because:Unsafe d/c plan and Inpatient level of care appropriate due to severity of illness   Dispo: The patient is from: Home              Anticipated d/c is to: SNF              Anticipated d/c date is: > 3 days              Patient currently is not medically stable to d/c. Barriers to discharge: Needs to undergo diverting loop colostomy, PT recommending SNF at present we have not received any bed offers from [place] affiliated facilities   Code Status: Full code Family Communication: Patient only DVT prophylaxis: SQ heparin Vaccination status: Did receive 1 dose of the Moderna COVID-vaccine.  Subsequently refusing any future doses or even a booster shot.  States he had COVID 3 months ago and has reduced immune system and does not feel these immunizations are effective for him.  Foley catheter: No but does have bilateral percutaneous nephrostomy tubes  HPI: HPI on 03/31/2020 by Dr. 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 Patient was reportedly seen by neighbors yesterday in a recliner and was then found on the floor today obtunded. He was sent to [facility] emergency department and is unable to provide history due to his clinical condition.  Interim history Admitted with acute renal  failure on chronic kidney disease, stage V secondary to urinary obstruction and dehydration with electrolyte abnormalities.  Nephrology consulted.  Patient did undergo hemodialysis.  IR placed bilateral percutaneous nephrostomy tubes.  Creatinine did improve with a couple cycles of hemodialysis however plateaued.  Hemodialysis catheter was then removed.  No further plans for hemodialysis at this time.  Nephrology feels that patient can be discharged home with outpatient follow-up.  At this time, discharge is complicated by patient being extremely weak and deconditioned.  Initially patient did not want to be discharged to SNF however this morning he states that he will consider it.   Subjective: Patient alert and was very anxious today.  Stating he has had an difficulty and "I just do not feel right" in context of this being the anniversary of his wife's death.  Initially told us that he wished to delay surgery for diverting colostomy today.  Declined is a benzodiazepine for anxiety.  After I left noted patient had been reevaluated by urology Dr. Alinda Money and it appears patient is planning on proceeding with the diverting colostomy so he can undergo concurrent cystoscopy with bilateral ureteral stent change during same anesthetic.  Objective: Vitals:   04/13/20 0628 04/13/20 0855  BP: 101/68   Pulse: 76 76  Resp: 13 19  Temp: 98.1 F (36.7 C) 98 F (36.7 C)  SpO2:  100%    Intake/Output Summary (Last 24 hours) at 04/13/2020 1118 Last data filed at 04/13/2020 0900 Gross per 24 hour  Intake 0 ml  Output 1425 ml  Net -  1425 ml   Filed Weights   04/11/20 0418 04/12/20 0406 04/13/20 0628  Weight: 55.8 kg 55.6 kg 55.7 kg    Exam:  Constitutional: NAD, calm but anxious, comfortable-very cachectic in appearance Respiratory: clear to auscultation bilaterally, Normal respiratory effort. No accessory muscle use.  Cardiovascular: Regular rate and rhythm, . No extremity edema.  Extremities warm with  appropriate capillary refill  Abdomen: Soft, nontender nondistended.  NPO for upcoming surgery.  Bowel sounds positive. JP drain to left abdomen with opaque fluid noted. Genitourinary: Bilateral percutaneous nephrostomy tubes in place draining clear yellow urine to collection bag; also has chronic Foley catheter draining yellow urine to bedside bag Musculoskeletal: no clubbing / cyanosis. Good ROM,Normal muscle tone.  Skin: no rashes, lesions, ulcers. No induration-overall pale in appearance Neurologic: CN 2-12 grossly intact. Sensation intact, DTR normal. Strength 5/5 x all 4 extremities.  Psychiatric: Normal judgment and insight. Alert and oriented x 3.  Anxious and somewhat depressed mood.    Assessment/Plan: Acute problems: Acute renal failure on chronic kidney disease, stage V -Nephrology consulted and appreciated -Creatinine 5.76 in October 2021, although patient presented with a creatinine of 16.69 -Chronic kidney disease likely secondary to longstanding obstruction.  Acute kidney injury due to dehydration and sepsis along with ATN with obstruction. -Patient also noted to have severe electrolyte derangements with severe acidosis and likely uremia on presentation. -Interventional radiology consulted and appreciated for bilateral percutaneous nephrostomy tube placement, 04/01/2020 as well as temporary HD catheter placement. -Patient did undergo hemodialysis, 2 cycles with last hemodialysis on 04/04/2019. -Creatinine has now plateaued, currently 5.08 -Though creatinine is still elevated, it is below his baseline.  Patient himself is unsure about wanting continuous HD.  Nephrology recommended that patient follow-up with nephrology as an outpatient.  Recommended continuing bicarb on discharge.  Bilateral hydronephrosis -Per IR follow-up on 04/07/2019, patient had urinary obstruction secondary to pelvic mass with subsequent bilateral hydronephrosis in the setting of bilateral ureteral stents,  bilateral PCNs inadvertently removed (unknown how long they have been out), status post bilateral PCN placement on 04/01/2020. -1/13 Dr. Alinda Money and plans on proceeding with concurrent procedure with surgical team.  He will perform cystoscopy and bilateral ureteral stent change (see below) -He also has a chronic Foley catheter as well. -Also has history of Hodgkin's lymphoma and is status postchemotherapy and radiation which may have also contributed to recurrent obstructive renal disease  History of pelvic abscess/known colovesical fistula -Noted on 11/15/2019, status post left transgluteal drain by IR/colovesicular fistula -The surgical team has reevaluated patient on 1/12 and plan is to proceed with diverting loop colostomy on 1/13. Please see their note for details-patient will have concurrent urological procedure done by Dr. Alinda Money -Patient supposed to follow-up with gastroenterology, Dr. Rush Landmark  Severe physical deconditioning, history of prior CVA -July 2021, history of atrial fibrillation, patient is not a candidate for anticoagulation due to GI bleed and thrombocytopenia -Currently no focal deficits  Severe protein calorie malnutrition Nutrition Problem: Severe Malnutrition Etiology: chronic illness (colovesical fistula) Signs/Symptoms: moderate fat depletion,severe fat depletion,moderate muscle depletion,severe muscle depletion Interventions: MVI,Refer to RD note for recommendations Estimated body mass index is 19.82 kg/m as calculated from the following:   Height as of this encounter: 5\' 6"  (1.676 m).   Weight as of this encounter: 55.7 kg.  Chronic pain -Continue low-dose Percocet along with Neurontin -Discussed with nephrology and pharmacy- continue low-dose Zanaflex   Other problems: Severe dehydration with multiple metabolic derangements as follows: Severe metabolic acidosis, hyperkalemia, hyperphosphatemia -Resolved after  rehydration, dialysis and Kayexalate  Severe  sepsis secondary to urinary tract infection -Present on admission-sepsis physiology has now resolved -Patient presented with encephalopathy as well as hypothermia and leukocytosis -Blood cultures were negative x2 -Urine culture from 12/31 and 1/1 showed multiple species -CT renal stone study 12/31: Bladder wall appeared slightly thickened.  No significant fluid collection in pelvis -Patient completed 7 days of IV cefepime  Goals of care -Palliative care consulted and appreciated -Patient full code and refused to discuss any decisions regarding plan of care -Patient should follow up with palliative care as an outpatient  PTSD -Sedatives held  History of gastric bypass  -Avoid NSAIDs (unable to use currently in context of chronic kidney disease)  Data Reviewed: Basic Metabolic Panel: Recent Labs  Lab 04/08/20 0422 04/09/20 0250 04/10/20 0321 04/11/20 0334 04/12/20 0335  NA 135 135 134* 136 137  K 4.6 4.4 5.2* 4.9 4.8  CL 105 105 106 104 106  CO2 20* 19* 17* 22 20*  GLUCOSE 86 98 92 83 82  BUN 39* 41* 45* 41* 42*  CREATININE 4.99* 5.09* 5.34* 5.08* 5.15*  CALCIUM 7.1* 7.3* 7.4* 7.7* 7.7*   Liver Function Tests: No results for input(s): AST, ALT, ALKPHOS, BILITOT, PROT, ALBUMIN in the last 168 hours. No results for input(s): LIPASE, AMYLASE in the last 168 hours. No results for input(s): AMMONIA in the last 168 hours. CBC: Recent Labs  Lab 04/11/20 0334  HGB 8.6*  HCT 27.8*   Cardiac Enzymes: No results for input(s): CKTOTAL, CKMB, CKMBINDEX, TROPONINI in the last 168 hours. BNP (last 3 results) No results for input(s): BNP in the last 8760 hours.  ProBNP (last 3 results) No results for input(s): PROBNP in the last 8760 hours.  CBG: Recent Labs  Lab 04/12/20 0405 04/12/20 0753 04/12/20 1554 04/12/20 2218 04/13/20 0446  GLUCAP 77 87 89 86 73    Recent Results (from the past 240 hour(s))  Surgical pcr screen     Status: None   Collection Time: 04/13/20   4:48 AM   Specimen: Nasal Mucosa; Nasal Swab  Result Value Ref Range Status   MRSA, PCR NEGATIVE NEGATIVE Final   Staphylococcus aureus NEGATIVE NEGATIVE Final    Comment: (NOTE) The Xpert SA Assay (FDA approved for NASAL specimens in patients 48 years of age and older), is one component of a comprehensive surveillance program. It is not intended to diagnose infection nor to guide or monitor treatment. Performed at [facility], [place] [address], [place], [place] [address]      Studies: No results found.  Scheduled Meds: . acetaminophen  1,000 mg Oral On Call to OR  . alvimopan  12 mg Oral On Call to OR  . bisacodyl  20 mg Oral Once  . bupivacaine liposome  20 mL Infiltration Once  . calcium carbonate  1 tablet Oral TID  . Chlorhexidine Gluconate Cloth  6 each Topical Daily  . DULoxetine  60 mg Oral Daily   And  . DULoxetine  30 mg Oral QHS  . feeding supplement  296 mL Oral Once  . feeding supplement  592 mL Oral Once  . gabapentin  300 mg Oral On Call to OR  . heparin  5,000 Units Subcutaneous Q8H  . multivitamin with minerals  1 tablet Oral BID  . pantoprazole  40 mg Oral QHS  . polyethylene glycol  34 g Oral Once  . sodium bicarbonate  1,300 mg Oral TID  . sodium chloride flush  5 mL Intracatheter Q8H  . tamsulosin  0.4 mg Oral QHS   Continuous Infusions: . cefoTEtan (CEFOTAN) IV      Principal Problem:   Sepsis with acute renal failure without septic shock (HCC) Active Problems:   CKD (chronic kidney disease), stage IV (HCC)   Chronic pain   Protein-calorie malnutrition, severe   Colovesical fistula   Acute renal failure superimposed on stage 5 chronic kidney disease, not on chronic dialysis ([place])   History of COVID-19   History of gastric bypass   Hodgkin lymphoma, unspecified, unspecified site ([place])   Metabolic acidosis   Toxic metabolic encephalopathy   PTSD (post-traumatic stress disorder)   Bilateral ureteral obstruction s/p perc nephrosotmy &  stenting   History of CVA (cerebrovascular accident)   DNR (do not resuscitate) discussion   Palliative care by specialist   Adult failure to thrive   Consultants:    Procedures:    Antibiotics: Anti-infectives (From admission, onward)   Start     Dose/Rate Route Frequency Ordered Stop   04/13/20 1145  cefoTEtan (CEFOTAN) 2 g in sodium chloride 0.9 % 100 mL IVPB        2 g 200 mL/hr over 30 Minutes Intravenous On call to O.R. 04/12/20 2006 04/14/20 0559   04/12/20 2100  neomycin (MYCIFRADIN) tablet 1,000 mg       "And" Linked Group Details   1,000 mg Oral 3 times per day 04/12/20 2006 04/13/20 0527   04/12/20 2100  metroNIDAZOLE (FLAGYL) tablet 1,000 mg       "And" Linked Group Details   1,000 mg Oral 3 times per day 04/12/20 2006 04/13/20 0527   04/03/20 1600  ceFEPIme (MAXIPIME) 1 g in sodium chloride 0.9 % 100 mL IVPB        1 g 200 mL/hr over 30 Minutes Intravenous Every 24 hours 04/03/20 0918 04/07/20 1108   04/01/20 1600  ceFEPIme (MAXIPIME) 1 g in sodium chloride 0.9 % 100 mL IVPB  Status:  Discontinued        1 g 200 mL/hr over 30 Minutes Intravenous Every 24 hours 03/31/20 1550 04/03/20 0918   04/01/20 1145  cefTRIAXone (ROCEPHIN) 1 g in sodium chloride 0.9 % 100 mL IVPB        1 g 200 mL/hr over 30 Minutes Intravenous  Once 04/01/20 1057 04/01/20 1308   03/31/20 1415  ceFEPIme (MAXIPIME) 2 g in sodium chloride 0.9 % 100 mL IVPB        2 g 200 mL/hr over 30 Minutes Intravenous  Once 03/31/20 1407 03/31/20 1516       Time spent:  30 minutes    Erin Hearing ANP  [facility] 7 am - 330 pm/M-F for direct patient care and secure chat Please refer to Amion for contact info 13  days

## 2020-04-13 NOTE — Plan of Care (Signed)
  Problem: Elimination: Goal: Will not experience complications related to urinary retention Outcome: Progressing   Problem: Safety: Goal: Ability to remain free from injury will improve Outcome: Progressing   Problem: Activity: Goal: Risk for activity intolerance will decrease Outcome: Not Progressing   Problem: Pain Managment: Goal: General experience of comfort will improve Outcome: Not Progressing

## 2020-04-13 NOTE — Progress Notes (Signed)
PT Cancellation Note  Patient Details Name: Chase SI Sr. MRN: [id number] DOB: March 12, 1966   Cancelled Treatment:    Reason Eval/Treat Not Completed: (P) Patient declined, no reason specified;Pain limiting ability to participate (Pt with abdominal pain and discomfort this session.  He refused mobility OOB and supine exercises.  He does report," I don't want to refuse but I just can't do it today.")   Chase [name] 04/13/2020, 5:25 PM  Erasmo Leventhal , PTA Acute Rehabilitation Services Pager [phone] Office [phone]

## 2020-04-13 NOTE — Progress Notes (Addendum)
Patient ID: Chase [name]., male   DOB: 1965/08/28, 55 y.o.   MRN: [id number]  Day of Surgery Subjective: Chase [name] was evaluated by Dr. Johney [name] with general surgery and is not felt to be ready for fistula repair next week as initially planned.  He therefore has planned to take him for a diverting colostomy today.    Objective: Vital signs in last 24 hours: Temp:  [98 F (36.7 C)-98.1 F (36.7 C)] 98 F (36.7 C) (01/13 0855) Pulse Rate:  [61-76] 76 (01/13 0855) Resp:  [13-19] 19 (01/13 0855) BP: (101-129)/(68-70) 101/68 (01/13 0628) SpO2:  [100 %] 100 % (01/13 0855) Weight:  [55.7 kg] 55.7 kg (01/13 0628)  Intake/Output from previous day: 01/12 0701 - 01/13 0700 In: 0  Out: 1425 [Urine:1425] Intake/Output this shift: No intake/output data recorded.  Physical Exam:  General: Alert and oriented  Lab Results: Recent Labs    04/11/20 0334  HGB 8.6*  HCT 27.8*   BMET Recent Labs    04/11/20 0334 04/12/20 0335  NA 136 137  K 4.9 4.8  CL 104 106  CO2 22 20*  GLUCOSE 83 82  BUN 41* 42*  CREATININE 5.08* 5.15*  CALCIUM 7.7* 7.7*     Studies/Results: No results found.  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 Considering that he is planning to go to the OR today for diverting colostomy, I discussed proceeding with cystoscopy and bilateral ureteral stent change during the same anesthetic for his convenience.  He is agreeable to proceed and gives informed consent.   LOS: 13 days   [name] 04/13/2020, 11:06 AM    Pt decided against proceeding with colostomy today.  As such, his procedure was cancelled.  Will plan to coordinate stent exchange with Dr. Johney [name] at time of diverting colostomy when rescheduled.

## 2020-04-13 NOTE — Progress Notes (Signed)
OT Cancellation Note  Patient Details Name: Chase [name] Sr. MRN: [id number] DOB: 12-16-1965   Cancelled Treatment:    Reason Eval/Treat Not Completed: Other (comment) Pt reports concern about his surgery for today wanting to speak to "his boys" about this surgery instead of participating in therapy today. Will check back as time allows pending pt is agreeable to participate in session.   Harley Alto., COTA/L Acute Rehabilitation Services [phone]   Precious Haws 04/13/2020, 8:51 AM

## 2020-04-14 DIAGNOSIS — E43 Unspecified severe protein-calorie malnutrition: Secondary | ICD-10-CM | POA: Diagnosis not present

## 2020-04-14 DIAGNOSIS — N184 Chronic kidney disease, stage 4 (severe): Secondary | ICD-10-CM | POA: Diagnosis not present

## 2020-04-14 DIAGNOSIS — N321 Vesicointestinal fistula: Secondary | ICD-10-CM | POA: Diagnosis not present

## 2020-04-14 DIAGNOSIS — R627 Adult failure to thrive: Secondary | ICD-10-CM | POA: Diagnosis not present

## 2020-04-14 LAB — GLUCOSE, CAPILLARY
Glucose-Capillary: 109 mg/dL — ABNORMAL HIGH (ref 70–99)
Glucose-Capillary: 79 mg/dL (ref 70–99)
Glucose-Capillary: 81 mg/dL (ref 70–99)
Glucose-Capillary: 98 mg/dL (ref 70–99)

## 2020-04-14 MED ORDER — ONDANSETRON HCL 4 MG/2ML IJ SOLN
4.0000 mg | Freq: Four times a day (QID) | INTRAMUSCULAR | Status: DC
  Administered 2020-04-14 – 2020-04-22 (×16): 4 mg via INTRAVENOUS
  Filled 2020-04-14 (×17): qty 2

## 2020-04-14 MED ORDER — ONDANSETRON HCL 4 MG PO TABS
4.0000 mg | ORAL_TABLET | Freq: Four times a day (QID) | ORAL | Status: DC
  Administered 2020-04-15 – 2020-04-21 (×8): 4 mg via ORAL
  Filled 2020-04-14 (×8): qty 1

## 2020-04-14 NOTE — Progress Notes (Signed)
[facility] PROGRESS NOTE  Chase [name] OAC:[id number] DOB: 15-May-1965 DOA: 03/31/2020 PCP: [name]      Status: Remains inpatient appropriate because:Unsafe d/c plan and Inpatient level of care appropriate due to severity of illness   Dispo: The patient is from: Home              Anticipated d/c is to: SNF              Anticipated d/c date is: > 3 days              Patient currently is not medically stable to d/c. Barriers to discharge: Needs to undergo diverting loop colostomy, PT recommending SNF at present we have not received any bed offers from [place] affiliated facilities   Code Status: Full code Family Communication: Patient only DVT prophylaxis: SQ heparin Vaccination status: Did receive 1 dose of the Moderna COVID-vaccine.  Subsequently refusing any future doses or even a booster shot.  States he had COVID 3 months ago and has reduced immune system and does not feel these immunizations are effective for him.  Foley catheter: No but does have bilateral percutaneous nephrostomy tubes  HPI: HPI on 03/31/2020 by Dr. 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 Patient was reportedly seen by neighbors yesterday in a recliner and was then found on the floor today obtunded. He was sent to [facility] emergency department and is unable to provide history due to his clinical condition.  Interim history Admitted with  acute renal failure on chronic kidney disease, stage V secondary to urinary obstruction and dehydration with electrolyte abnormalities.  Nephrology consulted.  Patient did undergo hemodialysis.  IR placed bilateral percutaneous nephrostomy tubes.  Creatinine did improve with a couple cycles of hemodialysis however plateaued.  Hemodialysis catheter was then removed.  No further plans for hemodialysis at this time.  Nephrology feels that patient can be discharged home with outpatient follow-up.  At this time, discharge is complicated by patient being extremely weak and deconditioned.  Initially patient did not want to be discharged to SNF however this morning he states that he will consider it.   Subjective: Nearly reporting nausea that he is unclear if related to ongoing abdominal pathology versus anxiety.  States anxiety is somewhat better than yesterday.  Objective: Vitals:   04/14/20 0200 04/14/20 0300  BP: 111/74   Pulse:    Resp:  15  Temp:    SpO2:      Intake/Output Summary (Last 24 hours) at 04/14/2020 1057 Last data filed at 04/14/2020 0700 Gross per 24 hour  Intake 480 ml  Output 850 ml  Net -370 ml   Filed Weights   04/11/20 0418 04/12/20 0406 04/13/20 0628  Weight: 55.8 kg 55.6 kg 55.7 kg    Exam:  Constitutional: NAD, calm, comfortable-very cachectic in appearance Respiratory: Anterior lung sounds clear to auscultation, normal respiratory effort. No accessory muscle use.  Cardiovascular: Sounds normal S1-S2, pulses regular, skin is pale but warm and dry with adequate capillary refill Abdomen: Soft, nontender nondistended. Bowel  sounds positive. JP drain to left abdomen with opaque fluid noted. Genitourinary: Bilateral percutaneous nephrostomy tubes in place draining clear yellow urine to collection bag; also has chronic Foley catheter draining yellow urine to bedside bag Musculoskeletal: no clubbing / cyanosis. Good ROM,Normal muscle tone.  Neurologic: CN 2-12 grossly  intact. Sensation intact, DTR normal. Strength 5/5 x all 4 extremities.  Psychiatric: Normal judgment and insight. Alert and oriented x 3.  Anxious and somewhat depressed mood.    Assessment/Plan: Acute problems: Acute renal failure on chronic kidney disease, stage V -Nephrology consulted and appreciated -Creatinine 5.76 in October 2021, although patient presented with a creatinine of 16.69 -Chronic kidney disease likely secondary to longstanding obstruction.  Acute kidney injury due to dehydration and sepsis along with ATN with obstruction. -Patient also noted to have severe electrolyte derangements with severe acidosis and likely uremia on presentation. -Interventional radiology consulted and appreciated for bilateral percutaneous nephrostomy tube placement, 04/01/2020 as well as temporary HD catheter placement. -Patient did undergo hemodialysis, 2 cycles with last hemodialysis on 04/04/2019. -Creatinine has now plateaued, currently 5.08 -Though creatinine is still elevated, it is below his baseline.  Patient himself is unsure about wanting continuous HD.  Nephrology recommended that patient follow-up with nephrology as an outpatient.  Recommended continuing bicarb on discharge.  Bilateral hydronephrosis -Per IR follow-up on 04/07/2019, patient had urinary obstruction secondary to pelvic mass with subsequent bilateral hydronephrosis in the setting of bilateral ureteral stents, bilateral PCNs inadvertently removed (unknown how long they have been out), status post bilateral PCN placement on 04/01/2020. -1/13 Dr. Alinda Money and plans on proceeding with concurrent procedure with surgical team.  He will perform cystoscopy and bilateral ureteral stent change (see below) -He also has a chronic Foley catheter as well. -Also has history of Hodgkin's lymphoma and is status postchemotherapy and radiation which may have also contributed to recurrent obstructive renal disease  History of pelvic abscess/known  colovesical fistula -Noted on 11/15/2019, status post left transgluteal drain by IR/colovesicular fistula -The surgical team has reevaluated patient on 1/12 and plan is to proceed with diverting loop colostomy on 1/13. Please see their note for details-patient will have concurrent urological procedure done by Dr. Alinda Money -Discussed with patient that it is imperative he proceeds with surgery in order to achieve any degree of healing.  If he chooses not to proceed with surgery we need to sit down with a multidisciplinary team and discuss what his options are.  If he refuses surgery it is likely we will need to discuss end-of-life care with focus on comfort and discussed need to implement DNR status. -Patient supposed to follow-up with gastroenterology, Dr. Rush Landmark -Continues with persistent nausea so therefore have changed to Zofran to scheduled and we will check EKG today regarding QTC and will need to check every 72 hours  Severe physical deconditioning, history of prior CVA -July 2021, history of atrial fibrillation, patient is not a candidate for anticoagulation due to GI bleed and thrombocytopenia -Currently no focal deficits  Severe protein calorie malnutrition Nutrition Problem: Severe Malnutrition Etiology: chronic illness (colovesical fistula) Signs/Symptoms: moderate fat depletion,severe fat depletion,moderate muscle depletion,severe muscle depletion Interventions: MVI,Refer to RD note for recommendations Estimated body mass index is 19.82 kg/m as calculated from the following:   Height as of this encounter: 5\' 6"  (1.676 m).   Weight as of this encounter: 55.7 kg.  Chronic pain -Continue low-dose Percocet along with Neurontin -Discussed with nephrology and pharmacy- continue low-dose Zanaflex   Other problems: Severe dehydration with multiple metabolic  derangements as follows: Severe metabolic acidosis, hyperkalemia, hyperphosphatemia -Resolved after rehydration, dialysis and  Kayexalate  Severe sepsis secondary to urinary tract infection -Present on admission-sepsis physiology has now resolved -Patient presented with encephalopathy as well as hypothermia and leukocytosis -Blood cultures were negative x2 -Urine culture from 12/31 and 1/1 showed multiple species -CT renal stone study 12/31: Bladder wall appeared slightly thickened.  No significant fluid collection in pelvis -Patient completed 7 days of IV cefepime  Goals of care -Palliative care consulted and appreciated -Patient full code and refused to discuss any decisions regarding plan of care -Patient should follow up with palliative care as an outpatient  PTSD -Sedatives held  History of gastric bypass  -Avoid NSAIDs (unable to use currently in context of chronic kidney disease)  Data Reviewed: Basic Metabolic Panel: Recent Labs  Lab 04/08/20 0422 04/09/20 0250 04/10/20 0321 04/11/20 0334 04/12/20 0335  NA 135 135 134* 136 137  K 4.6 4.4 5.2* 4.9 4.8  CL 105 105 106 104 106  CO2 20* 19* 17* 22 20*  GLUCOSE 86 98 92 83 82  BUN 39* 41* 45* 41* 42*  CREATININE 4.99* 5.09* 5.34* 5.08* 5.15*  CALCIUM 7.1* 7.3* 7.4* 7.7* 7.7*   Liver Function Tests: No results for input(s): AST, ALT, ALKPHOS, BILITOT, PROT, ALBUMIN in the last 168 hours. No results for input(s): LIPASE, AMYLASE in the last 168 hours. No results for input(s): AMMONIA in the last 168 hours. CBC: Recent Labs  Lab 04/11/20 0334  HGB 8.6*  HCT 27.8*   Cardiac Enzymes: No results for input(s): CKTOTAL, CKMB, CKMBINDEX, TROPONINI in the last 168 hours. BNP (last 3 results) No results for input(s): BNP in the last 8760 hours.  ProBNP (last 3 results) No results for input(s): PROBNP in the last 8760 hours.  CBG: Recent Labs  Lab 04/13/20 1150 04/13/20 1637 04/13/20 2120 04/14/20 0034 04/14/20 0337  GLUCAP 96 85 81 109* 79    Recent Results (from the past 240 hour(s))  Surgical pcr screen     Status: None    Collection Time: 04/13/20  4:48 AM   Specimen: Nasal Mucosa; Nasal Swab  Result Value Ref Range Status   MRSA, PCR NEGATIVE NEGATIVE Final   Staphylococcus aureus NEGATIVE NEGATIVE Final    Comment: (NOTE) The Xpert SA Assay (FDA approved for NASAL specimens in patients 83 years of age and older), is one component of a comprehensive surveillance program. It is not intended to diagnose infection nor to guide or monitor treatment. Performed at [facility], [place] [address] [place], [place] [address]      Studies: No results found.  Scheduled Meds: . bisacodyl  20 mg Oral Once  . bupivacaine liposome  20 mL Infiltration Once  . calcium carbonate  1 tablet Oral TID  . Chlorhexidine Gluconate Cloth  6 each Topical Daily  . DULoxetine  60 mg Oral Daily   And  . DULoxetine  30 mg Oral QHS  . feeding supplement  296 mL Oral Once  . feeding supplement  592 mL Oral Once  . heparin  5,000 Units Subcutaneous Q8H  . multivitamin with minerals  1 tablet Oral BID  . ondansetron  4 mg Oral Q6H   Or  . ondansetron (ZOFRAN) IV  4 mg Intravenous Q6H  . pantoprazole  40 mg Oral QHS  . polyethylene glycol  34 g Oral Once  . sodium bicarbonate  1,300 mg Oral TID  . sodium chloride flush  5 mL  Intracatheter Q8H  . tamsulosin  0.4 mg Oral QHS   Continuous Infusions:   Principal Problem:   Sepsis with acute renal failure without septic shock (HCC) Active Problems:   CKD (chronic kidney disease), stage IV (HCC)   Chronic pain   Protein-calorie malnutrition, severe   Colovesical fistula   Acute renal failure (HCC)   History of COVID-19   History of gastric bypass   Hodgkin lymphoma, unspecified, unspecified site ([place])   Metabolic acidosis   Toxic metabolic encephalopathy   PTSD (post-traumatic stress disorder)   Bilateral ureteral obstruction s/p perc nephrosotmy & stenting   History of CVA (cerebrovascular accident)   DNR (do not resuscitate) discussion   Palliative care by  specialist   Adult failure to thrive   Consultants:    Procedures:    Antibiotics: Anti-infectives (From admission, onward)   Start     Dose/Rate Route Frequency Ordered Stop   04/13/20 1145  cefoTEtan (CEFOTAN) 2 g in sodium chloride 0.9 % 100 mL IVPB        2 g 200 mL/hr over 30 Minutes Intravenous On call to O.R. 04/12/20 2006 04/14/20 0559   04/12/20 2100  neomycin (MYCIFRADIN) tablet 1,000 mg       "And" Linked Group Details   1,000 mg Oral 3 times per day 04/12/20 2006 04/13/20 0527   04/12/20 2100  metroNIDAZOLE (FLAGYL) tablet 1,000 mg       "And" Linked Group Details   1,000 mg Oral 3 times per day 04/12/20 2006 04/13/20 0527   04/03/20 1600  ceFEPIme (MAXIPIME) 1 g in sodium chloride 0.9 % 100 mL IVPB        1 g 200 mL/hr over 30 Minutes Intravenous Every 24 hours 04/03/20 0918 04/07/20 1108   04/01/20 1600  ceFEPIme (MAXIPIME) 1 g in sodium chloride 0.9 % 100 mL IVPB  Status:  Discontinued        1 g 200 mL/hr over 30 Minutes Intravenous Every 24 hours 03/31/20 1550 04/03/20 0918   04/01/20 1145  cefTRIAXone (ROCEPHIN) 1 g in sodium chloride 0.9 % 100 mL IVPB        1 g 200 mL/hr over 30 Minutes Intravenous  Once 04/01/20 1057 04/01/20 1308   03/31/20 1415  ceFEPIme (MAXIPIME) 2 g in sodium chloride 0.9 % 100 mL IVPB        2 g 200 mL/hr over 30 Minutes Intravenous  Once 03/31/20 1407 03/31/20 1516       Time spent:  30 minutes    Erin Hearing ANP  [facility] 7 am - 330 pm/M-F for direct patient care and secure chat Please refer to Amion for contact info 14  days

## 2020-04-14 NOTE — Plan of Care (Signed)
  Problem: Safety: Goal: Ability to remain free from injury will improve Outcome: Progressing   Problem: Safety: Goal: Non-violent Restraint(s) Outcome: Progressing   Problem: Pain Managment: Goal: General experience of comfort will improve Outcome: Not Progressing

## 2020-04-14 NOTE — Progress Notes (Signed)
OT Cancellation Note  Patient Details Name: Chase PASCH Sr. MRN: [id number] DOB: 1965-09-30   Cancelled Treatment:    Reason Eval/Treat Not Completed: Patient declined, stated he was tired and already worked with therapy today.  As of this writing, no notes have been placed as to working with rehab.    Richard D Popella 04/14/2020, 4:50 PM

## 2020-04-14 NOTE — Progress Notes (Signed)
Patient refuse foley care.

## 2020-04-14 NOTE — Progress Notes (Addendum)
Physical Therapy Treatment Patient Details Name: Chase ANSTEAD Sr. MRN: [id number] DOB: April 27, 1965 Today's Date: 04/14/2020    History of Present Illness 55 year old male with PMH of Hodgkin's lymphoma s/p chemotherapy 2011-2012, gastric bypass in 4920 complicated by anastomotic ulcer in 2014, colovesical fistula with abscess s/p PC drain, stage IV CKD, bilateral ureteral stenosis with ureteral stents and bilateral percutaneous nephrostomy tubes, history of CVA in 2021 (residual L sided weakness). He was admitted for acute renal failure complicating stage V CKD secondary to urinary obstruction and dehydration with severe electrolyte derangements.  Urology was consulted.  Patient underwent hemodialysis.    PT Comments    Pt supine in bed and eager to participate in PT session this pm.  PTA performed assessment of BP in supine as he reported feeling dizzy last PT session.  After progressing to sitting PTA obtained BP, pt reports feeling dizzy.  + orthostasis.  Deferred progression from seated position.  Informed nursing.  Plan remains appropriate for SNF placement.  Orthostatic Vitals Lying-112/73 Sitting-62/42 Sitting after 2 min-87/69 Return to supine - 113/76   Follow Up Recommendations  SNF     Equipment Recommendations  None recommended by PT    Recommendations for Other Services       Precautions / Restrictions Precautions Precautions: Fall Precaution Comments: x2 nephrostomy bags and x1 bulb Restrictions Weight Bearing Restrictions: No    Mobility  Bed Mobility Overal bed mobility: Needs Assistance Bed Mobility: Supine to Sit;Sit to Supine     Supine to sit: Supervision Sit to supine: Supervision   General bed mobility comments: supervision for safety, cues for technique, upon sitting edge of bed reports dizziness with substantial drop in BP.  Pt sat x 2 min edge of bed as he reported," I don't feel like I will pass out, I will let you know if it get worse."  After  2 min edge of bed BP in sitting improved but remained too low to progress to standing.  Pt returned to supine.  Transfers Overall transfer level:  (Unable due to soft BPs.)                  Ambulation/Gait                 Stairs             Wheelchair Mobility    Modified Rankin (Stroke Patients Only)       Balance Overall balance assessment: Needs assistance Sitting-balance support: Feet supported Sitting balance-Leahy Scale: Fair                                      Cognition Arousal/Alertness: Awake/alert Behavior During Therapy: WFL for tasks assessed/performed Overall Cognitive Status: Impaired/Different from baseline Area of Impairment: Awareness                           Awareness: Emergent          Exercises General Exercises - Lower Extremity Long Arc Quad: Both;10 reps;Seated;AROM Heel Slides: AROM;Both;10 reps;Supine Hip ABduction/ADduction: AROM;Both;10 reps;Supine Straight Leg Raises: AROM;Both;10 reps;Supine Hip Flexion/Marching: Both;10 reps;Seated;AROM    General Comments        Pertinent Vitals/Pain Pain Assessment: No/denies pain    Home Living                      Prior Function  PT Goals (current goals can now be found in the care plan section) Acute Rehab PT Goals Potential to Achieve Goals: Fair Progress towards PT goals: Progressing toward goals    Frequency    Min 2X/week      PT Plan Frequency needs to be updated    Co-evaluation              AM-PAC PT "6 Clicks" Mobility   Outcome Measure  Help needed turning from your back to your side while in a flat bed without using bedrails?: A Little Help needed moving from lying on your back to sitting on the side of a flat bed without using bedrails?: A Little Help needed moving to and from a bed to a chair (including a wheelchair)?: A Little Help needed standing up from a chair using your arms  (e.g., wheelchair or bedside chair)?: A Little Help needed to walk in hospital room?: A Little Help needed climbing 3-5 steps with a railing? : A Little 6 Click Score: 18    End of Session Equipment Utilized During Treatment: Gait belt Activity Tolerance: Patient tolerated treatment well Patient left: in bed;with call bell/phone within reach;with bed alarm set Nurse Communication: Mobility status;Patient requests pain meds PT Visit Diagnosis: Unsteadiness on feet (R26.81);Difficulty in walking, not elsewhere classified (R26.2);Muscle weakness (generalized) (M62.81)     Time: 1420-1440 PT Time Calculation (min) (ACUTE ONLY): 20 min  Charges:  $Therapeutic Activity: 8-22 mins                     Erasmo Leventhal , PTA Acute Rehabilitation Services Pager [phone] Office [phone]     Chase [name] Chase [name] 04/14/2020, 6:03 PM

## 2020-04-14 NOTE — Progress Notes (Signed)
I may have availability next Tuesday afternoon, 11 January, for diverting colostomy.  Dr Alinda Money with urology to see if they can do cystoscopy & ureteral stent exchanges as well.    Of course this is all dependent on whether the patient is willing to proceed with surgery since he is canceled yesterday & wished to postpone..  Will check on Monday to see if patient amenable.  Please let surgery know Monday if patient wishes to reconsider surgery.  Adin Hector, MD, FACS, MASCRS Gastrointestinal and Minimally Invasive Surgery  [facility] [address] [place], [place] [address] [phone] Fax [phone] Main/Paging  CONTACT INFORMATION: Weekday (9AM-5PM) concerns: Call [facility] main office at [phone] Weeknight (5PM-9AM) or Weekend/Holiday concerns: Check [email or web address] for General Surgery [facility] coverage (Please, do not use SecureChat as it is not reliable communication to operating surgeons for immediate patient care)

## 2020-04-15 DIAGNOSIS — N179 Acute kidney failure, unspecified: Secondary | ICD-10-CM | POA: Diagnosis not present

## 2020-04-15 DIAGNOSIS — R6520 Severe sepsis without septic shock: Secondary | ICD-10-CM | POA: Diagnosis not present

## 2020-04-15 DIAGNOSIS — A419 Sepsis, unspecified organism: Secondary | ICD-10-CM | POA: Diagnosis not present

## 2020-04-15 LAB — GLUCOSE, CAPILLARY
Glucose-Capillary: 107 mg/dL — ABNORMAL HIGH (ref 70–99)
Glucose-Capillary: 78 mg/dL (ref 70–99)
Glucose-Capillary: 81 mg/dL (ref 70–99)
Glucose-Capillary: 87 mg/dL (ref 70–99)
Glucose-Capillary: 90 mg/dL (ref 70–99)
Glucose-Capillary: 95 mg/dL (ref 70–99)

## 2020-04-15 NOTE — Progress Notes (Signed)
Patient refuse foley care will continue to monitor.

## 2020-04-15 NOTE — Progress Notes (Signed)
[facility] PROGRESS NOTE  Chase [name] FVO:[id number] DOB: [birth year] DOA: 03/31/2020 PCP: [name]      Status: Remains inpatient appropriate because:Unsafe d/c plan and Inpatient level of care appropriate due to severity of illness   Dispo: The patient is from: Home              Anticipated d/c is to: SNF              Anticipated d/c date is: > 3 days              Patient currently is not medically stable to d/c. Barriers to discharge: Needs to undergo diverting loop colostomy, PT recommending SNF at present we have not received any bed offers from [place] affiliated facilities   Code Status: Full code Family Communication: Patient only DVT prophylaxis: SQ heparin Vaccination status: Did receive 1 dose of the Moderna COVID-vaccine.  Subsequently refusing any future doses or even a booster shot.  States he had COVID 3 months ago and has reduced immune system and does not feel these immunizations are effective for him.  Foley catheter: No but has bilateral percutaneous nephrostomy tubes  HPI: HPI on 03/31/2020 by Dr. 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 Patient was reportedly seen by neighbors yesterday in a recliner and was then found on the floor today obtunded. He was sent to [facility] emergency department and is unable to provide history due to his clinical condition.  Interim history Admitted with acute  renal failure on chronic kidney disease, stage V secondary to urinary obstruction and dehydration with electrolyte abnormalities.  Nephrology consulted.  Patient did undergo hemodialysis.  IR placed bilateral percutaneous nephrostomy tubes.  Creatinine did improve with a couple cycles of hemodialysis however plateaued.  Hemodialysis catheter was then removed.  No further plans for hemodialysis at this time.  Nephrology feels that patient can be discharged home with outpatient follow-up.  At this time, discharge is complicated by patient being extremely weak and deconditioned.  Initially patient did not want to be discharged to SNF however this morning he states that he will consider it.   Subjective: Feels hungry, denies nausea or worsening abdominal pain.   Objective: Vitals:   04/15/20 0431 04/15/20 1207  BP: 120/76 90/66  Pulse:    Resp: (!) 8 16  Temp: 97.7 F (36.5 C) 98.7 F (37.1 C)  SpO2: 100%     Intake/Output Summary (Last 24 hours) at 04/15/2020 1838 Last data filed at 04/15/2020 1800 Gross per 24 hour  Intake 495 ml  Output 1350 ml  Net -855 ml   Filed Weights   04/13/20 0628 04/15/20 0039 04/15/20 0600  Weight: 55.7 kg 53.7 kg 52.9 kg    Exam:  Constitutional: NAD, calm, comfortable-very cachectic in appearance Respiratory: No cough, normal respiratory effort. No accessory muscle use.  Cardiovascular: S1-S2 present, RRR.  Abdomen: Soft, nondistended. JP drain to left abdomen with opaque fluid noted. Genitourinary: Bilateral percutaneous nephrostomy tubes in place draining clear yellow urine to collection bag Musculoskeletal: no clubbing /  cyanosis. Good ROM,Normal muscle tone.  Neurologic: No facial droop, normal speech. Sensation intact, DTR normal. Able to move all 4 extremities.  Psychiatric:  Alert and oriented x 3.  Anxious and somewhat depressed mood.    Assessment/Plan: Acute problems: Acute renal failure on chronic kidney disease, stage V -Nephrology  consulted and appreciated -Creatinine 5.76 in October 2021, although patient presented with a creatinine of 16.69 -Chronic kidney disease likely secondary to longstanding obstruction.  Acute kidney injury due to dehydration and sepsis along with ATN with obstruction. -Patient also noted to have severe electrolyte derangements with severe acidosis and likely uremia on presentation. -Interventional radiology consulted and appreciated for bilateral percutaneous nephrostomy tube placement, 04/01/2020 as well as temporary HD catheter placement. -Patient did undergo hemodialysis, 2 cycles with last hemodialysis on 04/04/2019. -Creatinine has now plateaued, currently 5.08 -Though creatinine is still elevated, it is below his baseline.  Patient himself is unsure about wanting continuous HD.  Nephrology recommended that patient follow-up with nephrology as an outpatient.  Recommended continuing bicarb on discharge. 1/15 No change  Bilateral hydronephrosis -Per IR follow-up on 04/07/2019, patient had urinary obstruction secondary to pelvic mass with subsequent bilateral hydronephrosis in the setting of bilateral ureteral stents, bilateral PCNs inadvertently removed (unknown how long they have been out), status post bilateral PCN placement on 04/01/2020. -1/13 Dr. Alinda Money and plans on proceeding with concurrent procedure with surgical team.  He will perform cystoscopy and bilateral ureteral stent change (see below) -He also has a chronic Foley catheter as well. -Also has history of Hodgkin's lymphoma and is status postchemotherapy and radiation which may have also contributed to recurrent obstructive renal disease 1/15 No change  History of pelvic abscess/known colovesical fistula -Noted on 11/15/2019, status post left transgluteal drain by IR/colovesicular fistula -The surgical team has reevaluated patient on 1/12 and plan is to proceed with diverting loop colostomy on 1/13. Please see their note for details-patient  will have concurrent urological procedure done by Dr. Alinda Money -Discussed with patient that it is imperative he proceeds with surgery in order to achieve any degree of healing.  If he chooses not to proceed with surgery we need to sit down with a multidisciplinary team and discuss what his options are.  If he refuses surgery it is likely we will need to discuss end-of-life care with focus on comfort and discussed need to implement DNR status. -Patient supposed to follow-up with gastroenterology, Dr. Rush Landmark -Continues with persistent nausea so therefore have changed to Zofran to scheduled and we will check EKG today regarding QTC and will need to check every 72 hours 1/15 Will resume diet today as his abdomina discomfort and nausea have improved.   Severe physical deconditioning, history of prior CVA -July 2021, history of atrial fibrillation, patient is not a candidate for anticoagulation due to GI bleed and thrombocytopenia -Currently no focal deficits  Severe protein calorie malnutrition Nutrition Problem: Severe Malnutrition Etiology: chronic illness (colovesical fistula) Signs/Symptoms: moderate fat depletion,severe fat depletion,moderate muscle depletion,severe muscle depletion Interventions: MVI,Refer to RD note for recommendations Estimated body mass index is 18.82 kg/m as calculated from the following:   Height as of this encounter: 5\' 6"  (1.676 m).   Weight as of this encounter: 52.9 kg.  Chronic pain -Continue low-dose Percocet along with Neurontin -Discussed with nephrology and pharmacy- continue low-dose Zanaflex   Other problems: Severe dehydration with multiple metabolic derangements as follows: Severe metabolic acidosis, hyperkalemia, hyperphosphatemia -Resolved after rehydration, dialysis and Kayexalate  Severe sepsis secondary to urinary tract infection -  Present on admission-sepsis physiology has now resolved -Patient presented with encephalopathy as well as  hypothermia and leukocytosis -Blood cultures were negative x2 -Urine culture from 12/31 and 1/1 showed multiple species -CT renal stone study 12/31: Bladder wall appeared slightly thickened.  No significant fluid collection in pelvis -Patient completed 7 days of IV cefepime  Goals of care -Palliative care consulted and appreciated -Patient full code and refused to discuss any decisions regarding plan of care -Patient should follow up with palliative care as an outpatient  PTSD -Sedatives held  History of gastric bypass  -Avoid NSAIDs (unable to use currently in context of chronic kidney disease)  Data Reviewed: Basic Metabolic Panel: Recent Labs  Lab 04/09/20 0250 04/10/20 0321 04/11/20 0334 04/12/20 0335  NA 135 134* 136 137  K 4.4 5.2* 4.9 4.8  CL 105 106 104 106  CO2 19* 17* 22 20*  GLUCOSE 98 92 83 82  BUN 41* 45* 41* 42*  CREATININE 5.09* 5.34* 5.08* 5.15*  CALCIUM 7.3* 7.4* 7.7* 7.7*   Liver Function Tests: No results for input(s): AST, ALT, ALKPHOS, BILITOT, PROT, ALBUMIN in the last 168 hours. No results for input(s): LIPASE, AMYLASE in the last 168 hours. No results for input(s): AMMONIA in the last 168 hours. CBC: Recent Labs  Lab 04/11/20 0334  HGB 8.6*  HCT 27.8*   Cardiac Enzymes: No results for input(s): CKTOTAL, CKMB, CKMBINDEX, TROPONINI in the last 168 hours. BNP (last 3 results) No results for input(s): BNP in the last 8760 hours.  ProBNP (last 3 results) No results for input(s): PROBNP in the last 8760 hours.  CBG: Recent Labs  Lab 04/15/20 0035 04/15/20 0426 04/15/20 0731 04/15/20 1204 04/15/20 1622  GLUCAP 107* 81 78 95 87    Recent Results (from the past 240 hour(s))  Surgical pcr screen     Status: None   Collection Time: 04/13/20  4:48 AM   Specimen: Nasal Mucosa; Nasal Swab  Result Value Ref Range Status   MRSA, PCR NEGATIVE NEGATIVE Final   Staphylococcus aureus NEGATIVE NEGATIVE Final    Comment: (NOTE) The Xpert SA  Assay (FDA approved for NASAL specimens in patients 76 years of age and older), is one component of a comprehensive surveillance program. It is not intended to diagnose infection nor to guide or monitor treatment. Performed at [facility], [place] [address], [place], [place] [address]      Studies: No results found.  Scheduled Meds: . bisacodyl  20 mg Oral Once  . bupivacaine liposome  20 mL Infiltration Once  . calcium carbonate  1 tablet Oral TID  . Chlorhexidine Gluconate Cloth  6 each Topical Daily  . DULoxetine  60 mg Oral Daily   And  . DULoxetine  30 mg Oral QHS  . feeding supplement  296 mL Oral Once  . feeding supplement  592 mL Oral Once  . heparin  5,000 Units Subcutaneous Q8H  . multivitamin with minerals  1 tablet Oral BID  . ondansetron  4 mg Oral Q6H   Or  . ondansetron (ZOFRAN) IV  4 mg Intravenous Q6H  . pantoprazole  40 mg Oral QHS  . polyethylene glycol  34 g Oral Once  . sodium bicarbonate  1,300 mg Oral TID  . sodium chloride flush  5 mL Intracatheter Q8H  . tamsulosin  0.4 mg Oral QHS   Continuous Infusions:   Principal Problem:   Sepsis with acute renal failure without septic shock (HCC) Active Problems:  CKD (chronic kidney disease), stage IV (HCC)   Chronic pain   Protein-calorie malnutrition, severe   Colovesical fistula   Acute renal failure (HCC)   History of COVID-19   History of gastric bypass   Hodgkin lymphoma, unspecified, unspecified site ([place])   Metabolic acidosis   Toxic metabolic encephalopathy   PTSD (post-traumatic stress disorder)   Bilateral ureteral obstruction s/p perc nephrosotmy & stenting   History of CVA (cerebrovascular accident)   DNR (do not resuscitate) discussion   Palliative care by specialist   Adult failure to thrive   Consultants:    Procedures:    Antibiotics: Anti-infectives (From admission, onward)   Start     Dose/Rate Route Frequency Ordered Stop   04/13/20 1145  cefoTEtan (CEFOTAN) 2  g in sodium chloride 0.9 % 100 mL IVPB        2 g 200 mL/hr over 30 Minutes Intravenous On call to O.R. 04/12/20 2006 04/14/20 0559   04/12/20 2100  neomycin (MYCIFRADIN) tablet 1,000 mg       "And" Linked Group Details   1,000 mg Oral 3 times per day 04/12/20 2006 04/13/20 0527   04/12/20 2100  metroNIDAZOLE (FLAGYL) tablet 1,000 mg       "And" Linked Group Details   1,000 mg Oral 3 times per day 04/12/20 2006 04/13/20 0527   04/03/20 1600  ceFEPIme (MAXIPIME) 1 g in sodium chloride 0.9 % 100 mL IVPB        1 g 200 mL/hr over 30 Minutes Intravenous Every 24 hours 04/03/20 0918 04/07/20 1108   04/01/20 1600  ceFEPIme (MAXIPIME) 1 g in sodium chloride 0.9 % 100 mL IVPB  Status:  Discontinued        1 g 200 mL/hr over 30 Minutes Intravenous Every 24 hours 03/31/20 1550 04/03/20 0918   04/01/20 1145  cefTRIAXone (ROCEPHIN) 1 g in sodium chloride 0.9 % 100 mL IVPB        1 g 200 mL/hr over 30 Minutes Intravenous  Once 04/01/20 1057 04/01/20 1308   03/31/20 1415  ceFEPIme (MAXIPIME) 2 g in sodium chloride 0.9 % 100 mL IVPB        2 g 200 mL/hr over 30 Minutes Intravenous  Once 03/31/20 1407 03/31/20 1516       Time spent: 35 minutes including more than 50% of this time on chart review, patient care coordination, discussing plan with the patient and his nurse a the bedside  Blain Pais, MD  [facility] 7 am - 330 pm/M-F for direct patient care and secure chat Please refer to [place] for contact info 15  days

## 2020-04-16 DIAGNOSIS — N179 Acute kidney failure, unspecified: Secondary | ICD-10-CM | POA: Diagnosis not present

## 2020-04-16 DIAGNOSIS — N135 Crossing vessel and stricture of ureter without hydronephrosis: Secondary | ICD-10-CM | POA: Diagnosis not present

## 2020-04-16 DIAGNOSIS — E43 Unspecified severe protein-calorie malnutrition: Secondary | ICD-10-CM | POA: Diagnosis not present

## 2020-04-16 LAB — GLUCOSE, CAPILLARY
Glucose-Capillary: 81 mg/dL (ref 70–99)
Glucose-Capillary: 97 mg/dL (ref 70–99)
Glucose-Capillary: 90 mg/dL (ref 70–99)
Glucose-Capillary: 91 mg/dL (ref 70–99)
Glucose-Capillary: 130 mg/dL — ABNORMAL HIGH (ref 70–99)

## 2020-04-16 LAB — PREALBUMIN: Prealbumin: 20.6 mg/dL (ref 18–38)

## 2020-04-16 LAB — PROTIME-INR
INR: 1 (ref 0.8–1.2)
Prothrombin Time: 13.2 seconds (ref 11.4–15.2)

## 2020-04-16 NOTE — Progress Notes (Addendum)
[facility] PROGRESS NOTE  Chase [name] VOZ:[id number] DOB: [birth year] DOA: 03/31/2020 PCP: [name]      Status: Remains inpatient appropriate because:Unsafe d/c plan and Inpatient level of care appropriate due to severity of illness   Dispo: The patient is from: Home              Anticipated d/c is to: SNF              Anticipated d/c date is: > 3 days              Patient currently is not medically stable to d/c. Barriers to discharge: Needs to undergo diverting loop colostomy, PT recommending SNF at present we have not received any bed offers from [place] affiliated facilities   Code Status: Full code Family Communication: Patient only DVT prophylaxis: SQ heparin Vaccination status: Did receive 1 dose of the Moderna COVID-vaccine.  Subsequently refusing any future doses or even a booster shot.  States he had COVID 3 months ago and has reduced immune system and does not feel these immunizations are effective for him.  Foley catheter: No but has bilateral percutaneous nephrostomy tubes  HPI: HPI on 03/31/2020 by Dr. 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 Patient was reportedly seen by neighbors yesterday in a recliner and was then found on the floor today obtunded. He was sent to [facility] emergency department and is unable to provide history due to his clinical condition.  Interim history Admitted with acute  renal failure on chronic kidney disease, stage V secondary to urinary obstruction and dehydration with electrolyte abnormalities.  Nephrology consulted.  Patient did undergo hemodialysis.  IR placed bilateral percutaneous nephrostomy tubes.  Creatinine did improve with a couple cycles of hemodialysis however plateaued.  Hemodialysis catheter was then removed.  No further plans for hemodialysis at this time.  Nephrology feels that patient can be discharged home with outpatient follow-up.  At this time, discharge is complicated by patient being extremely weak and deconditioned.  Initially patient did not want to be discharged to SNF however this morning he states that he will consider it. Agreeable to surgical repair - diverting colostomy and cysto/stents.   Subjective: C/o pain -"all over" due for pain medication. Says he is ready to have surgery.   Objective: Vitals:   04/15/20 1207 04/16/20 0639  BP: 90/66 111/66  Pulse:    Resp: 16 10  Temp: 98.7 F (37.1 C)   SpO2:      Intake/Output Summary (Last 24 hours) at 04/16/2020 1256 Last data filed at 04/16/2020 0600 Gross per 24 hour  Intake 750 ml  Output 950 ml  Net -200 ml   Filed Weights   04/15/20 0039 04/15/20 0600 04/16/20 0500  Weight: 53.7 kg 52.9 kg 53.1 kg    Exam:  Constitutional: asthenic, NAD, pale Respiratory: lungs cta   Cardiovascular: S1-S2 present, RRR.  Abdomen: soft and NT, JP drain in place. Genitourinary: bilateral nephrostomy tubes - urine draining Neuro/Psychiatric:  Alert and oriented x 3.  Anxious, irritable  and somewhat depressed mood.    Assessment/Plan: Acute problems: Acute renal failure on chronic kidney disease, stage V -Nephrology consulted and appreciated -Creatinine 5.76 in October 2021, although patient presented with a creatinine of 16.69 -Chronic kidney disease likely secondary to longstanding obstruction.  Acute kidney injury due to dehydration and sepsis along with ATN with  obstruction. -Patient also noted to have severe electrolyte derangements with severe acidosis and likely uremia on presentation. -Interventional radiology consulted and appreciated for bilateral percutaneous nephrostomy tube placement, 04/01/2020 as well as temporary HD catheter placement. -Patient did undergo hemodialysis, 2 cycles with last hemodialysis on 04/04/2019. -Creatinine has now plateaued, currently 5.08 -Though creatinine is still elevated, it is below his baseline.  Patient himself is unsure about wanting continuous HD.  Nephrology recommended that patient follow-up with nephrology as an outpatient.  Recommended continuing bicarb on discharge. 1/15 No change 1/16 no change - awaiting surgery - cysto and stents - check CBC  Bilateral hydronephrosis -Per IR follow-up on 04/07/2019, patient had urinary obstruction secondary to pelvic mass with subsequent bilateral hydronephrosis in the setting of bilateral ureteral stents, bilateral PCNs inadvertently removed (unknown how long they have been out), status post bilateral PCN placement on 04/01/2020. -1/13 Dr. Alinda Money and plans on proceeding with concurrent procedure with surgical team.  He will perform cystoscopy and bilateral ureteral stent change (see below) -He also has a chronic Foley catheter as well. -Also has history of Hodgkin's lymphoma and is status postchemotherapy and radiation which may have also contributed to recurrent obstructive renal disease 1/15 No change 1/16 stable - await surgery  History of pelvic abscess/known colovesical fistula -Noted on 11/15/2019, status post left transgluteal drain by IR/colovesicular fistula -The surgical team has reevaluated patient on 1/12 and plan is to proceed with diverting loop colostomy on 1/13. Please see their note for details-patient will have concurrent urological procedure done by Dr. Alinda Money -Discussed with patient that it is imperative he proceeds with surgery in order to achieve any  degree of healing.  If he chooses not to proceed with surgery we need to sit down with a multidisciplinary team and discuss what his options are.  If he refuses surgery it is likely we will need to discuss end-of-life care with focus on comfort and discussed need to implement DNR status. -Patient supposed to follow-up with gastroenterology, Dr. Rush Landmark -Continues with persistent nausea so therefore have changed to Zofran to scheduled and we will check EKG today regarding QTC and will need to check every 72 hours 1/15 Will resume diet today as his abdomina discomfort and nausea have improved.  1/16 surgery possibly Tues 1/18  Severe physical deconditioning, history of prior CVA -July 2021, history of atrial fibrillation, patient is not a candidate for anticoagulation due to GI bleed and thrombocytopenia -Currently no focal deficits  Severe protein calorie malnutrition Nutrition Problem: Severe Malnutrition Etiology: chronic illness (colovesical fistula) Signs/Symptoms: moderate fat depletion,severe fat depletion,moderate muscle depletion,severe muscle depletion Interventions: MVI,Refer to RD note for recommendations Estimated body mass index is 18.89 kg/m as calculated from the following:   Height as of this encounter: 5\' 6"  (1.676 m).   Weight as of this encounter: 53.1 kg.  Continue Ensure pre-surgery supplements Check CMET, prealbumin, INR  Chronic pain -Continue low-dose Percocet along with Neurontin -Discussed with nephrology and pharmacy- continue low-dose Zanaflex   Other problems: Severe dehydration with multiple metabolic derangements as follows: Severe metabolic acidosis, hyperkalemia, hyperphosphatemia -Resolved after rehydration, dialysis and Kayexalate  Severe sepsis secondary to urinary tract infection -  Present on admission-sepsis physiology has now resolved -Patient presented with encephalopathy as well as hypothermia and leukocytosis -Blood cultures were negative  x2 -Urine culture from 12/31 and 1/1 showed multiple species -CT renal stone study 12/31: Bladder wall appeared slightly thickened.  No significant fluid collection in pelvis -Patient completed 7 days of IV cefepime  Goals of care -Palliative care consulted and appreciated -Patient full code and refused to discuss any decisions regarding plan of care -Patient should follow up with palliative care as an outpatient  PTSD -Sedatives held  History of gastric bypass  -Avoid NSAIDs (unable to use currently in context of chronic kidney disease)  Data Reviewed: Basic Metabolic Panel: Recent Labs  Lab 04/10/20 0321 04/11/20 0334 04/12/20 0335  NA 134* 136 137  K 5.2* 4.9 4.8  CL 106 104 106  CO2 17* 22 20*  GLUCOSE 92 83 82  BUN 45* 41* 42*  CREATININE 5.34* 5.08* 5.15*  CALCIUM 7.4* 7.7* 7.7*   Liver Function Tests: CBC: Recent Labs  Lab 04/11/20 0334  HGB 8.6*  HCT 27.8*    CBG: Recent Labs  Lab 04/15/20 1622 04/15/20 2114 04/16/20 0620 04/16/20 0914 04/16/20 1150  GLUCAP 87 90 81 97 90    Recent Results (from the past 240 hour(s))  Surgical pcr screen     Status: None   Collection Time: 04/13/20  4:48 AM   Specimen: Nasal Mucosa; Nasal Swab  Result Value Ref Range Status   MRSA, PCR NEGATIVE NEGATIVE Final   Staphylococcus aureus NEGATIVE NEGATIVE Final    Comment: (NOTE) The Xpert SA Assay (FDA approved for NASAL specimens in patients 60 years of age and older), is one component of a comprehensive surveillance program. It is not intended to diagnose infection nor to guide or monitor treatment. Performed at [facility], [place] [address], [place], [place] [address]      Studies: No results found.  Scheduled Meds: . bisacodyl  20 mg Oral Once  . bupivacaine liposome  20 mL Infiltration Once  . calcium carbonate  1 tablet Oral TID  . Chlorhexidine Gluconate Cloth  6 each Topical Daily  . DULoxetine  60 mg Oral Daily   And  . DULoxetine  30  mg Oral QHS  . feeding supplement  296 mL Oral Once  . feeding supplement  592 mL Oral Once  . heparin  5,000 Units Subcutaneous Q8H  . multivitamin with minerals  1 tablet Oral BID  . ondansetron  4 mg Oral Q6H   Or  . ondansetron (ZOFRAN) IV  4 mg Intravenous Q6H  . pantoprazole  40 mg Oral QHS  . polyethylene glycol  34 g Oral Once  . sodium bicarbonate  1,300 mg Oral TID  . sodium chloride flush  5 mL Intracatheter Q8H  . tamsulosin  0.4 mg Oral QHS   Continuous Infusions:   Principal Problem:   Sepsis with acute renal failure without septic shock (HCC) Active Problems:   CKD (chronic kidney disease), stage IV (HCC)   Chronic pain   Protein-calorie malnutrition, severe   Colovesical fistula   Acute renal failure (HCC)   History of COVID-19   History of gastric bypass   Hodgkin lymphoma, unspecified, unspecified site (HCC)   Metabolic acidosis   Toxic metabolic encephalopathy   PTSD (post-traumatic stress disorder)   Bilateral ureteral obstruction s/p perc nephrosotmy & stenting   History of CVA (cerebrovascular accident)   DNR (do not resuscitate) discussion   Palliative care by  specialist   Adult failure to thrive   Consultants:    Procedures:    Antibiotics: Anti-infectives (From admission, onward)   Start     Dose/Rate Route Frequency Ordered Stop   04/13/20 1145  cefoTEtan (CEFOTAN) 2 g in sodium chloride 0.9 % 100 mL IVPB        2 g 200 mL/hr over 30 Minutes Intravenous On call to O.R. 04/12/20 2006 04/14/20 0559   04/12/20 2100  neomycin (MYCIFRADIN) tablet 1,000 mg       "And" Linked Group Details   1,000 mg Oral 3 times per day 04/12/20 2006 04/13/20 0527   04/12/20 2100  metroNIDAZOLE (FLAGYL) tablet 1,000 mg       "And" Linked Group Details   1,000 mg Oral 3 times per day 04/12/20 2006 04/13/20 0527   04/03/20 1600  ceFEPIme (MAXIPIME) 1 g in sodium chloride 0.9 % 100 mL IVPB        1 g 200 mL/hr over 30 Minutes Intravenous Every 24 hours  04/03/20 0918 04/07/20 1108   04/01/20 1600  ceFEPIme (MAXIPIME) 1 g in sodium chloride 0.9 % 100 mL IVPB  Status:  Discontinued        1 g 200 mL/hr over 30 Minutes Intravenous Every 24 hours 03/31/20 1550 04/03/20 0918   04/01/20 1145  cefTRIAXone (ROCEPHIN) 1 g in sodium chloride 0.9 % 100 mL IVPB        1 g 200 mL/hr over 30 Minutes Intravenous  Once 04/01/20 1057 04/01/20 1308   03/31/20 1415  ceFEPIme (MAXIPIME) 2 g in sodium chloride 0.9 % 100 mL IVPB        2 g 200 mL/hr over 30 Minutes Intravenous  Once 03/31/20 1407 03/31/20 1516        Silvano Rusk, MD  [facility] 7 am - 330 pm/M-F for direct patient care and secure chat Please refer to Amion for contact info 16  days

## 2020-04-17 DIAGNOSIS — E43 Unspecified severe protein-calorie malnutrition: Secondary | ICD-10-CM | POA: Diagnosis not present

## 2020-04-17 DIAGNOSIS — R627 Adult failure to thrive: Secondary | ICD-10-CM | POA: Diagnosis not present

## 2020-04-17 DIAGNOSIS — N321 Vesicointestinal fistula: Secondary | ICD-10-CM | POA: Diagnosis not present

## 2020-04-17 DIAGNOSIS — N170 Acute kidney failure with tubular necrosis: Secondary | ICD-10-CM | POA: Diagnosis not present

## 2020-04-17 DIAGNOSIS — N179 Acute kidney failure, unspecified: Secondary | ICD-10-CM | POA: Diagnosis not present

## 2020-04-17 DIAGNOSIS — N184 Chronic kidney disease, stage 4 (severe): Secondary | ICD-10-CM | POA: Diagnosis not present

## 2020-04-17 LAB — COMPREHENSIVE METABOLIC PANEL
ALT: 26 U/L (ref 0–44)
AST: 33 U/L (ref 15–41)
Albumin: 2.3 g/dL — ABNORMAL LOW (ref 3.5–5.0)
Alkaline Phosphatase: 122 U/L (ref 38–126)
Anion gap: 9 (ref 5–15)
BUN: 29 mg/dL — ABNORMAL HIGH (ref 6–20)
CO2: 25 mmol/L (ref 22–32)
Calcium: 8.3 mg/dL — ABNORMAL LOW (ref 8.9–10.3)
Chloride: 103 mmol/L (ref 98–111)
Creatinine, Ser: 4.97 mg/dL — ABNORMAL HIGH (ref 0.61–1.24)
GFR, Estimated: 13 mL/min — ABNORMAL LOW (ref >60)
Glucose, Bld: 95 mg/dL (ref 70–99)
Potassium: 4.6 mmol/L (ref 3.5–5.1)
Sodium: 137 mmol/L (ref 135–145)
Total Bilirubin: 0.5 mg/dL (ref 0.3–1.2)
Total Protein: 6.4 g/dL — ABNORMAL LOW (ref 6.5–8.1)

## 2020-04-17 LAB — CBC
HCT: 31.3 % — ABNORMAL LOW (ref 39.0–52.0)
Hemoglobin: 9.4 g/dL — ABNORMAL LOW (ref 13.0–17.0)
MCH: 29.3 pg (ref 26.0–34.0)
MCHC: 30 g/dL (ref 30.0–36.0)
MCV: 97.5 fL (ref 80.0–100.0)
Platelets: 341 10*3/uL (ref 150–400)
RBC: 3.21 MIL/uL — ABNORMAL LOW (ref 4.22–5.81)
RDW: 15.6 % — ABNORMAL HIGH (ref 11.5–15.5)
WBC: 6.8 10*3/uL (ref 4.0–10.5)
nRBC: 0 % (ref 0.0–0.2)

## 2020-04-17 LAB — GLUCOSE, CAPILLARY
Glucose-Capillary: 88 mg/dL (ref 70–99)
Glucose-Capillary: 97 mg/dL (ref 70–99)
Glucose-Capillary: 85 mg/dL (ref 70–99)
Glucose-Capillary: 86 mg/dL (ref 70–99)
Glucose-Capillary: 89 mg/dL (ref 70–99)

## 2020-04-17 MED ORDER — ACETAMINOPHEN 500 MG PO TABS
1000.0000 mg | ORAL_TABLET | ORAL | Status: AC
  Administered 2020-04-18: 1000 mg via ORAL
  Filled 2020-04-17: qty 2

## 2020-04-17 MED ORDER — BISACODYL 5 MG PO TBEC
20.0000 mg | DELAYED_RELEASE_TABLET | Freq: Once | ORAL | Status: AC
  Administered 2020-04-17: 20 mg via ORAL
  Filled 2020-04-17: qty 4

## 2020-04-17 MED ORDER — NEOMYCIN SULFATE 500 MG PO TABS
1000.0000 mg | ORAL_TABLET | Freq: Once | ORAL | Status: AC
  Administered 2020-04-17: 1000 mg via ORAL
  Filled 2020-04-17: qty 2

## 2020-04-17 MED ORDER — SENNOSIDES-DOCUSATE SODIUM 8.6-50 MG PO TABS
1.0000 | ORAL_TABLET | Freq: Two times a day (BID) | ORAL | Status: DC
  Administered 2020-04-17: 1 via ORAL
  Filled 2020-04-17 (×2): qty 1

## 2020-04-17 MED ORDER — GABAPENTIN 300 MG PO CAPS
300.0000 mg | ORAL_CAPSULE | ORAL | Status: AC
  Administered 2020-04-18: 300 mg via ORAL
  Filled 2020-04-17: qty 1

## 2020-04-17 MED ORDER — ENSURE PRE-SURGERY PO LIQD
296.0000 mL | Freq: Once | ORAL | Status: DC
  Filled 2020-04-17: qty 296

## 2020-04-17 MED ORDER — METOCLOPRAMIDE HCL 5 MG/ML IJ SOLN
10.0000 mg | Freq: Once | INTRAMUSCULAR | Status: AC
  Administered 2020-04-17: 10 mg via INTRAVENOUS
  Filled 2020-04-17: qty 2

## 2020-04-17 MED ORDER — SODIUM CHLORIDE 0.9 % IV SOLN
2.0000 g | INTRAVENOUS | Status: AC
  Administered 2020-04-18: 2 g via INTRAVENOUS
  Filled 2020-04-17: qty 2

## 2020-04-17 MED ORDER — BUPIVACAINE LIPOSOME 1.3 % IJ SUSP
20.0000 mL | Freq: Once | INTRAMUSCULAR | Status: DC
  Filled 2020-04-17 (×2): qty 20

## 2020-04-17 MED ORDER — PEG-KCL-NACL-NASULF-NA ASC-C 100 G PO SOLR
0.5000 | Freq: Once | ORAL | Status: AC
  Administered 2020-04-17: 100 g via ORAL
  Filled 2020-04-17 (×2): qty 1

## 2020-04-17 MED ORDER — METRONIDAZOLE 500 MG PO TABS
1000.0000 mg | ORAL_TABLET | Freq: Once | ORAL | Status: AC
  Administered 2020-04-17: 1000 mg via ORAL
  Filled 2020-04-17: qty 2

## 2020-04-17 MED ORDER — LACTATED RINGERS IV SOLN: INTRAVENOUS | Status: DC

## 2020-04-17 MED ORDER — ENSURE PRE-SURGERY PO LIQD
592.0000 mL | Freq: Once | ORAL | Status: DC
  Filled 2020-04-17: qty 592

## 2020-04-17 MED ORDER — ALVIMOPAN 12 MG PO CAPS
12.0000 mg | ORAL_CAPSULE | ORAL | Status: AC
  Administered 2020-04-18: 12 mg via ORAL
  Filled 2020-04-17 (×2): qty 1

## 2020-04-17 MED ORDER — PEG-KCL-NACL-NASULF-NA ASC-C 100 G PO SOLR
0.5000 | Freq: Once | ORAL | Status: DC
  Filled 2020-04-17: qty 1

## 2020-04-17 NOTE — H&P (View-Only) (Signed)
   Patient Name: Chase DACK Sr. Date of Encounter: 04/17/2020, 11:38 AM    Subjective  Ready to have surgery tomorrow - diverting colostomy and ureteral stent exchange. [name] asking for pre-op colonoscopy - pt agreeable    Objective  BP 103/66 (BP Location: Left Arm)   Pulse 64   Temp (!) 97.4 F (36.3 C) (Oral)   Resp (!) 9   Ht 5\' 6"  (1.676 m)   Wt 50.4 kg   SpO2 98%   BMI 17.93 kg/m  NAD bilat nephrostomy tubes and JP drain in place CBC Latest Ref Rng & Units 04/17/2020 04/11/2020 04/06/2020  WBC 4.0 - 10.5 K/uL 6.8 - 7.9  Hemoglobin 13.0 - 17.0 g/dL 9.4(L) 8.6(L) 8.3(L)  Hematocrit 39.0 - 52.0 % 31.3(L) 27.8(L) 24.3(L)  Platelets 150 - 400 K/uL 341 - 168       Assessment and Plan  Colovesical fistula and hx pelvic abscess w/ bilateral hydronephrosis - for OR tomorrow PM as above  Appropriate to do pre-op colonoscopy - in AM in endoscopy lab  The risks and benefits as well as alternatives of endoscopic procedure(s) have been discussed and reviewed. All questions answered. The patient agrees to proceed.   Gatha Mayer, MD, [facility] 04/17/2020 11:38 AM

## 2020-04-17 NOTE — Progress Notes (Addendum)
Chase [name] [id number] 57/05/1965  CARE TEAM:  PCP: [name]  Outpatient Care Team: Patient Care Team: [name] as PCP - General Raynelle Bring, MD as Consulting Physician (Urology) Michael Boston, MD as Consulting Physician (General Surgery) Kandace Blitz, MD as Referring Physician (Pain Medicine) Mignon Pine, DO as Consulting Physician (Infectious Diseases) Garvin Fila, MD as Consulting Physician (Neurology) Gatha Mayer, MD as Consulting Physician (Gastroenterology) Joylene Grapes Shaune Pollack, MD as Consulting Physician (Nephrology)  Inpatient Treatment Team: Treatment Team: Attending Provider: Mendel Corning, MD; Consulting Physician: Reesa Chew, MD; Technician: Rica Koyanagi, NT; Consulting Physician: Michael Boston, MD; Consulting Physician: Raynelle Bring, MD; [place] Nurse: Meryle Ready, RN; [place] Nurse: Nadara Mode, RN; Rounding Team: Jacquelyne Balint Team, MD; Nurse Practitioner: Samella Parr, NP; Case Manager: Arna Snipe, RN; Rounding Team: Loura Back, MD; Technician: Arvella Merles, NT; Registered Nurse: Lenon Oms, RN; Consulting Physician: Edison Pace, Md, MD; Consulting Physician: Doran Stabler, MD   Problem List:   Principal Problem:   Sepsis with acute renal failure without septic shock [facility]) Active Problems:   Colovesical fistula   Acute renal failure (HCC)   CKD (chronic kidney disease), stage IV ([place])   Protein-calorie malnutrition, severe   History of gastric bypass   Bilateral ureteral obstruction s/p perc nephrosotmy & stenting   History of CVA (cerebrovascular accident)   Chronic pain   History of COVID-19   Hodgkin lymphoma, unspecified, unspecified [facility])   Metabolic acidosis   Toxic metabolic encephalopathy   PTSD (post-traumatic stress disorder)   DNR (do not resuscitate) discussion   Palliative care by specialist   Adult failure to  thrive      Assessment  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    Chronic kidney disease exacerbated with colovesical fistula causing ureteral obstructions and problems with percutaneous nephrostomy tube and stents and Foley.  [facility] Stay = 17 days)  Plan:  -Again discussed about doing diverting colostomy possible Hartmann resection on.  He tells me he is ready to do it tomorrow.  He have time tomorrow afternoon around 1 PM.  Ideally would get diverting colostomy/Hartmann resection, cystoscopy with stent and possible Foley exchanges, possible colonoscopy or flexible sigmoidoscopy for evaluation of the area.  I again discussed the technique.  Nursing his room.  He says he is ready to proceed tomorrow.  The anatomy & physiology of the digestive tract was discussed.  The pathophysiology of  fistula between the bowel and bladder was discussed.  Natural history risks without surgery was discussed. I worked to give an overview of the disease and the frequent need to have multispecialty involvement.   I feel the risks of no intervention will lead to serious problems that outweigh the operative risks; therefore, I recommended surgery to treat the pathology.  Laparoscopic & open techniques for partial proctocolectomy with bladder repair were discussed.  Possible fecal diversion by ostomy was discussed.  We will work to preserve anal & pelvic floor function without sacrificing cure.  Need for prolonged bladder catheterization was discussed.  Risks such as bleeding, infection, abscess, leak, injury to other organs, need for repair of tissues / organs, recurrence with reoperation, possible ostomy, hernia, heart attack, death, and other risks were discussed.  I noted a good likelihood this will help address the problem.   Goals of post-operative recovery were discussed as well.  We will work to minimize complications.  An educational handout on the pathology was given as well.   Questions were answered.    The patient expresses understanding & wishes to proceed with surgery.  Ideally he would get cleaned out with a bowel prep but he is refusing laxatives.  Defer to gastroenterology.  May have to do this unprepped  Prior ostomy marking has fallen off.  We will ask WOCN nurses to redo today/tomorrow prior to Tues afternoon surgery  VTE prophylaxis- SCDs, etc  mobilize as tolerated to help recovery.  Patient preferring to be in bed  Disposition:  Disposition:  The patient is from: Home  Anticipate discharge to:  [place]  Anticipated Date of Discharge is:  April 24, 2020  Barriers to discharge:  Pending Clinical improvement (more likely than not  Patient currently is NOT MEDICALLY STABLE for discharge from the hospital from a surgery standpoint.   30 minutes spent in review, evaluation, examination, counseling, and coordination of care.  More than 50% of that time was spent in counseling.  04/17/2020    Subjective: (Chief complaint)  Patient now ready to consider surgery.  "Last week was a bad time".  Nursing in room.  Refusing laxatives.  Objective:  Vital signs:  Vitals:   04/16/20 2009 04/17/20 0201 04/17/20 0511 04/17/20 0831  BP: 102/73  103/62 103/66  Pulse: 70 76 61 64  Resp: 20 16 12  (!) 9  Temp: 98.5 F (36.9 C) 98.1 F (36.7 C) 98.1 F (36.7 C) (!) 97.4 F (36.3 C)  TempSrc: Oral  Oral Oral  SpO2: 100% 97% 98% 98%  Weight:   50.4 kg   Height:        Last BM Date: 04/13/20  Intake/Output   Yesterday:  01/16 0701 - 01/17 0700 In: 960 [P.O.:960] Out: 800 [Urine:800] This shift:  No intake/output data recorded.  Bowel function:  Flatus: YES  BM:  No  Drain:    Physical Exam:  General: Pt awake/alert in no acute distress.  Tired and sickly.  Cachectic.  Stable. Eyes: PERRL, normal EOM.  Sclera clear.  No icterus Neuro: CN II-XII intact w/o focal sensory/motor deficits. Lymph: No  head/neck/groin lymphadenopathy Psych:  No delerium/psychosis/paranoia.  Oriented x 4 HENT: Normocephalic, Mucus membranes moist.  No thrush Neck: Supple, No tracheal deviation.  No obvious thyromegaly Chest: No pain to chest wall compression.  Good respiratory excursion.  No audible wheezing CV:  Pulses intact.  Regular rhythm.  No major extremity edema MS: Normal AROM mjr joints.  No obvious deformity  Abdomen: Somewhat firm.  Flat  Nondistended.  Nontender.  No evidence of peritonitis.  Small epigastric hernia reducible.  Ext:  No deformity.  No mjr edema.  No cyanosis Skin: No petechiae / purpurea.  No major sores.  Warm and dry    Results:   Cultures: Recent Results (from the past 720 hour(s))  Urine culture     Status: Abnormal   Collection Time: 03/31/20  2:07 PM   Specimen: In/Out Cath Urine  Result Value Ref Range Status   Specimen Description   Final    IN/OUT CATH URINE Performed at [facility], [address], [place], [place] [address]    Special Requests   Final    NONE Performed at [facility], [address], [place], [place] [address]    Culture MULTIPLE SPECIES PRESENT, SUGGEST RECOLLECTION (A)  Final   Report Status 04/02/2020 FINAL  Final  Blood Culture (routine x 2)     Status: None  Collection Time: 03/31/20  2:20 PM   Specimen: BLOOD  Result Value Ref Range Status   Specimen Description BLOOD BLOOD LEFT FOREARM  Final   Special Requests   Final    BOTTLES DRAWN AEROBIC AND ANAEROBIC Blood Culture adequate volume   Culture   Final    NO GROWTH 5 DAYS Performed at [facility], [address] [place], [place] [address]    Report Status 04/05/2020 FINAL  Final  Blood Culture (routine x 2)     Status: None   Collection Time: 03/31/20  2:20 PM   Specimen: BLOOD  Result Value Ref Range Status   Specimen Description BLOOD LEFT ANTECUBITAL  Final   Special Requests   Final    BOTTLES DRAWN AEROBIC AND ANAEROBIC Blood Culture adequate volume    Culture   Final    NO GROWTH 5 DAYS Performed at [facility], [address], [place], [place] [address]    Report Status 04/05/2020 FINAL  Final  Resp Panel by RT-PCR (Flu A&B, Covid) Nasopharyngeal Swab     Status: None   Collection Time: 03/31/20  2:48 PM   Specimen: Nasopharyngeal Swab; Nasopharyngeal(NP) swabs in vial transport medium  Result Value Ref Range Status   SARS Coronavirus 2 by RT PCR NEGATIVE NEGATIVE Final    Comment: (NOTE) SARS-CoV-2 target nucleic acids are NOT DETECTED.  The SARS-CoV-2 RNA is generally detectable in upper respiratory specimens during the acute phase of infection. The lowest concentration of SARS-CoV-2 viral copies this assay can detect is 138 copies/mL. A negative result does not preclude SARS-Cov-2 infection and should not be used as the sole basis for treatment or other patient management decisions. A negative result may occur with  improper specimen collection/handling, submission of specimen other than nasopharyngeal swab, presence of viral mutation(s) within the areas targeted by this assay, and inadequate number of viral copies(<138 copies/mL). A negative result must be combined with clinical observations, patient history, and epidemiological information. The expected result is Negative.  Fact Sheet for Patients:  EntrepreneurPulse.com.au  Fact Sheet for Healthcare Providers:  IncredibleEmployment.be  This test is no t yet approved or cleared by the [place] FDA and  has been authorized for detection and/or diagnosis of SARS-CoV-2 by FDA under an Emergency Use Authorization (EUA). This EUA will remain  in effect (meaning this test can be used) for the duration of the COVID-19 declaration under Section 564(b)(1) of the Act, 21 U.S.C.section 360bbb-3(b)(1), unless the authorization is terminated  or revoked sooner.       Influenza A by PCR NEGATIVE NEGATIVE Final   Influenza B by PCR NEGATIVE  NEGATIVE Final    Comment: (NOTE) The Xpert Xpress SARS-CoV-2/FLU/RSV plus assay is intended as an aid in the diagnosis of influenza from Nasopharyngeal swab specimens and should not be used as a sole basis for treatment. Nasal washings and aspirates are unacceptable for Xpert Xpress SARS-CoV-2/FLU/RSV testing.  Fact Sheet for Patients: EntrepreneurPulse.com.au  Fact Sheet for Healthcare Providers: IncredibleEmployment.be  This test is not yet approved or cleared by the [place] FDA and has been authorized for detection and/or diagnosis of SARS-CoV-2 by FDA under an Emergency Use Authorization (EUA). This EUA will remain in effect (meaning this test can be used) for the duration of the COVID-19 declaration under Section 564(b)(1) of the Act, 21 U.S.C. section 360bbb-3(b)(1), unless the authorization is terminated or revoked.  Performed at [facility], [address] [place], [place] [address]   Urine Culture  Status: Abnormal   Collection Time: 03/31/20 10:27 PM   Specimen: In/Out Cath Urine  Result Value Ref Range Status   Specimen Description IN/OUT CATH URINE  Final   Special Requests   Final    NONE Performed at [facility], [address] [place], [place] [address]    Culture MULTIPLE SPECIES PRESENT, SUGGEST RECOLLECTION (A)  Final   Report Status 04/02/2020 FINAL  Final  Urine culture     Status: Abnormal   Collection Time: 04/01/20  1:57 PM   Specimen: Urine, Random  Result Value Ref Range Status   Specimen Description URINE, RANDOM  Final   Special Requests   Final    LEFT KIDNEY Performed at [facility], [place] [address] [place], [place] [address]    Culture MULTIPLE SPECIES PRESENT, SUGGEST RECOLLECTION (A)  Final   Report Status 04/02/2020 FINAL  Final  Surgical pcr screen     Status: None   Collection Time: 04/13/20  4:48 AM   Specimen: Nasal Mucosa; Nasal Swab  Result Value Ref Range Status    MRSA, PCR NEGATIVE NEGATIVE Final   Staphylococcus aureus NEGATIVE NEGATIVE Final    Comment: (NOTE) The Xpert SA Assay (FDA approved for NASAL specimens in patients 33 years of age and older), is one component of a comprehensive surveillance program. It is not intended to diagnose infection nor to guide or monitor treatment. Performed at [facility], [place] [address], [place],  [address]     Labs: Results for orders placed or performed during the hospital encounter of 03/31/20 (from the past 48 hour(s))  Glucose, capillary     Status: None   Collection Time: 04/15/20 12:04 PM  Result Value Ref Range   Glucose-Capillary 95 70 - 99 mg/dL    Comment: Glucose reference range applies only to samples taken after fasting for at least 8 hours.  Glucose, capillary     Status: None   Collection Time: 04/15/20  4:22 PM  Result Value Ref Range   Glucose-Capillary 87 70 - 99 mg/dL    Comment: Glucose reference range applies only to samples taken after fasting for at least 8 hours.   Comment 1 Notify RN    Comment 2 Document in Chart   Glucose, capillary     Status: None   Collection Time: 04/15/20  9:14 PM  Result Value Ref Range   Glucose-Capillary 90 70 - 99 mg/dL    Comment: Glucose reference range applies only to samples taken after fasting for at least 8 hours.   Comment 1 Notify RN    Comment 2 Document in Chart   Glucose, capillary     Status: None   Collection Time: 04/16/20  6:20 AM  Result Value Ref Range   Glucose-Capillary 81 70 - 99 mg/dL    Comment: Glucose reference range applies only to samples taken after fasting for at least 8 hours.   Comment 1 Notify RN    Comment 2 Document in Chart   Glucose, capillary     Status: None   Collection Time: 04/16/20  9:14 AM  Result Value Ref Range   Glucose-Capillary 97 70 - 99 mg/dL    Comment: Glucose reference range applies only to samples taken after fasting for at least 8 hours.  Glucose, capillary     Status:  None   Collection Time: 04/16/20 11:50 AM  Result Value Ref Range   Glucose-Capillary 90 70 - 99 mg/dL  Comment: Glucose reference range applies only to samples taken after fasting for at least 8 hours.  Protime-INR     Status: None   Collection Time: 04/16/20  1:54 PM  Result Value Ref Range   Prothrombin Time 13.2 11.4 - 15.2 seconds   INR 1.0 0.8 - 1.2    Comment: (NOTE) INR goal varies based on device and disease states. Performed at [facility], [place] [address] [place], [place] [address]   Prealbumin     Status: None   Collection Time: 04/16/20  1:54 PM  Result Value Ref Range   Prealbumin 20.6 18 - 38 mg/dL    Comment: Performed at [facility] [address], [place], [place] [address]  Glucose, capillary     Status: None   Collection Time: 04/16/20  4:03 PM  Result Value Ref Range   Glucose-Capillary 91 70 - 99 mg/dL    Comment: Glucose reference range applies only to samples taken after fasting for at least 8 hours.   Comment 1 Notify RN    Comment 2 Document in Chart   Glucose, capillary     Status: Abnormal   Collection Time: 04/16/20 10:18 PM  Result Value Ref Range   Glucose-Capillary 130 (H) 70 - 99 mg/dL    Comment: Glucose reference range applies only to samples taken after fasting for at least 8 hours.  Glucose, capillary     Status: None   Collection Time: 04/17/20  2:05 AM  Result Value Ref Range   Glucose-Capillary 88 70 - 99 mg/dL    Comment: Glucose reference range applies only to samples taken after fasting for at least 8 hours.  CBC     Status: Abnormal   Collection Time: 04/17/20  3:07 AM  Result Value Ref Range   WBC 6.8 4.0 - 10.5 K/uL   RBC 3.21 (L) 4.22 - 5.81 MIL/uL   Hemoglobin 9.4 (L) 13.0 - 17.0 g/dL   HCT 31.3 (L) 39.0 - 52.0 %   MCV 97.5 80.0 - 100.0 fL   MCH 29.3 26.0 - 34.0 pg   MCHC 30.0 30.0 - 36.0 g/dL   RDW 15.6 (H) 11.5 - 15.5 %   Platelets 341 150 - 400 K/uL   nRBC 0.0 0.0 - 0.2 %    Comment: Performed at  [facility] [address] [place], [place] [address]  Comprehensive metabolic panel     Status: Abnormal   Collection Time: 04/17/20  3:07 AM  Result Value Ref Range   Sodium 137 135 - 145 mmol/L   Potassium 4.6 3.5 - 5.1 mmol/L   Chloride 103 98 - 111 mmol/L   CO2 25 22 - 32 mmol/L   Glucose, Bld 95 70 - 99 mg/dL    Comment: Glucose reference range applies only to samples taken after fasting for at least 8 hours.   BUN 29 (H) 6 - 20 mg/dL   Creatinine, Ser 4.97 (H) 0.61 - 1.24 mg/dL   Calcium 8.3 (L) 8.9 - 10.3 mg/dL   Total Protein 6.4 (L) 6.5 - 8.1 g/dL   Albumin 2.3 (L) 3.5 - 5.0 g/dL   AST 33 15 - 41 U/L   ALT 26 0 - 44 U/L   Alkaline Phosphatase 122 38 - 126 U/L   Total Bilirubin 0.5 0.3 - 1.2 mg/dL   GFR, Estimated 13 (L) >60 mL/min    Comment: (NOTE) Calculated using the CKD-EPI Creatinine Equation (2021)    Anion  gap 9 5 - 15    Comment: Performed at [facility], [place] [address] [place], [place] [address]  Glucose, capillary     Status: None   Collection Time: 04/17/20  5:06 AM  Result Value Ref Range   Glucose-Capillary 97 70 - 99 mg/dL    Comment: Glucose reference range applies only to samples taken after fasting for at least 8 hours.  Glucose, capillary     Status: None   Collection Time: 04/17/20  9:25 AM  Result Value Ref Range   Glucose-Capillary 85 70 - 99 mg/dL    Comment: Glucose reference range applies only to samples taken after fasting for at least 8 hours.    Imaging / Studies: No results found.  Medications / Allergies: per chart  Antibiotics: Anti-infectives (From admission, onward)   Start     Dose/Rate Route Frequency Ordered Stop   04/13/20 1145  cefoTEtan (CEFOTAN) 2 g in sodium chloride 0.9 % 100 mL IVPB        2 g 200 mL/hr over 30 Minutes Intravenous On call to O.R. 04/12/20 2006 04/14/20 0559   04/12/20 2100  neomycin (MYCIFRADIN) tablet 1,000 mg       "And" Linked Group Details   1,000 mg Oral 3 times per day  04/12/20 2006 04/13/20 0527   04/12/20 2100  metroNIDAZOLE (FLAGYL) tablet 1,000 mg       "And" Linked Group Details   1,000 mg Oral 3 times per day 04/12/20 2006 04/13/20 0527   04/03/20 1600  ceFEPIme (MAXIPIME) 1 g in sodium chloride 0.9 % 100 mL IVPB        1 g 200 mL/hr over 30 Minutes Intravenous Every 24 hours 04/03/20 0918 04/07/20 1108   04/01/20 1600  ceFEPIme (MAXIPIME) 1 g in sodium chloride 0.9 % 100 mL IVPB  Status:  Discontinued        1 g 200 mL/hr over 30 Minutes Intravenous Every 24 hours 03/31/20 1550 04/03/20 0918   04/01/20 1145  cefTRIAXone (ROCEPHIN) 1 g in sodium chloride 0.9 % 100 mL IVPB        1 g 200 mL/hr over 30 Minutes Intravenous  Once 04/01/20 1057 04/01/20 1308   03/31/20 1415  ceFEPIme (MAXIPIME) 2 g in sodium chloride 0.9 % 100 mL IVPB        2 g 200 mL/hr over 30 Minutes Intravenous  Once 03/31/20 1407 03/31/20 1516        Note: Portions of this report may have been transcribed using voice recognition software. Every effort was made to ensure accuracy; however, inadvertent computerized transcription errors may be present.   Any transcriptional errors that result from this process are unintentional.    Adin Hector, MD, FACS, MASCRS Gastrointestinal and Minimally Invasive Surgery  [facility] [address] [place], [place] [address] [phone] Fax [phone] Main/Paging  CONTACT INFORMATION: Weekday (9AM-5PM) concerns: Call [facility] main office at [phone] Weeknight (5PM-9AM) or Weekend/Holiday concerns: Check [email or web address] for General Surgery [facility] coverage (Please, do not use SecureChat as it is not reliable communication to operating surgeons for immediate patient care)      04/17/2020  11:50 AM

## 2020-04-17 NOTE — Progress Notes (Signed)
Nutrition Follow-up  DOCUMENTATION CODES:   Severe malnutrition in context of chronic illness  INTERVENTION:   -Continue MVI with minerals BID -Continue 500 mg calcium carbonate TID -Continue double protein portions with meals -Snacks TID between meals  NUTRITION DIAGNOSIS:   Severe Malnutrition related to chronic illness (colovesical fistula) as evidenced by moderate fat depletion,severe fat depletion,moderate muscle depletion,severe muscle depletion.  Ongoing  GOAL:   Patient will meet greater than or equal to 90% of their needs  Progressing   MONITOR:   PO intake,Supplement acceptance,Diet advancement,Labs,Weight trends,Skin,I & O's  REASON FOR ASSESSMENT:   Malnutrition Screening Tool    ASSESSMENT:   55 year old male who presented to the ED on 12/31 after being found on the floor of his home with nephrostomy tubes out. 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 Admitted with acute renal failure superimposed on CKD IV, metabolic acidosis, hydronephrosis, and severe sepsis secondary to UTI.  01/01 - s/p bilateral percutaneous nephrostomy tube placement and right IJ non-tunneled HD catheter placement, attempted HD with hypotension, rapid response and PCCM consult 1/3- last HD  Reviewed I/O's: +160 ml x 24 hours and -3.1 L since 04/03/20  UOP: 800 ml x 24 hours  Pt unavailable at time of attempted contact.   Per chart review, pt with difficulty regarding care decisions. Noted he refused surgery last week. Per general surgery notes, plan for diverting colostomy tomorrow (04/18/20) and trying to coordinate flex sig/ colonoscopy and stent exchange concurrently.   Pt remains with variable oral intake. Noted meal completions 10-100%. Pt is receiving double protein  portions with meals. He is refusing supplements (Ensure, Boost Breeze, Prosource, [employer]).   Medications reviewed and include calcium carbonate, reglan, and senokot.   Labs reviewed: CBGS: 85-130 (inpatient orders for glycemic control are none).   Diet Order:   Diet Order            Diet NPO time specified Except for: Sips with Meds  Diet effective midnight           Diet clear liquid Room service appropriate? Yes; Fluid consistency: Thin  Diet effective now                 EDUCATION NEEDS:   Education needs have been addressed  Skin:  Skin Assessment: Skin Integrity Issues: Skin Integrity Issues:: Stage II,Other (Comment) Stage II: coccyx Other: puncture wound right buttocks  Last BM:  04/13/20  Height:   Ht Readings from Last 1 Encounters:  03/31/20 5\' 6"  (1.676 m)    Weight:   Wt Readings from Last 1 Encounters:  04/17/20 50.4 kg   BMI:  Body mass index is 17.93 kg/m.  Estimated Nutritional Needs:   Kcal:  1900-2100  Protein:  120-135 grams  Fluid:  > 1.9 L    Loistine Chance, RD, LDN, [place] Registered Dietitian II Certified Diabetes Care and Education Specialist Please refer to [facility] for RD and/or RD on-call/weekend/after hours pager

## 2020-04-17 NOTE — Consult Note (Signed)
[place] Nurse requested for preoperative stoma site marking  Discussed surgical procedure and stoma creation with patient.  His surgery is tomorrow and he is prepared.  He was marked last week and the marks have lightened. I have darkened and covered the marks.   Explained role of the [place] nurse team.  Provided the patient with educational booklet and provided samples of pouching options.  Answered patient questions.  Will follow postoperatively.   Domenic Moras MSN, RN, FNP-BC CWON Wound, Ostomy, Continence Nurse Pager [phone]

## 2020-04-17 NOTE — Plan of Care (Signed)
?  Problem: Coping: ?Goal: Level of anxiety will decrease ?Outcome: Progressing ?  ?Problem: Safety: ?Goal: Ability to remain free from injury will improve ?Outcome: Progressing ?  ?

## 2020-04-17 NOTE — Progress Notes (Addendum)
[facility] PROGRESS NOTE  Reilly Blades WCB:[id number] DOB: [birth year] DOA: 03/31/2020 PCP: [name]      Status: Remains inpatient appropriate because:Unsafe d/c plan and Inpatient level of care appropriate due to severity of illness   Dispo: The patient is from: Home              Anticipated d/c is to: SNF              Anticipated d/c date is: > 3 days              Patient currently is not medically stable to d/c. Barriers to discharge: Needs to undergo diverting loop colostomy, PT recommending SNF at present we have not received any bed offers from [place] affiliated facilities   Code Status: Full code Family Communication: Patient only DVT prophylaxis: SQ heparin Vaccination status: Did receive 1 dose of the Moderna COVID-vaccine.  Subsequently refusing any future doses or even a booster shot.  States he had COVID 3 months ago and has reduced immune system and does not feel these immunizations are effective for him.  Foley catheter: No but does have bilateral percutaneous nephrostomy tubes  HPI: HPI on 03/31/2020 by Dr. 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 Patient was reportedly seen by neighbors yesterday in a recliner and was then found on the floor today obtunded. He was sent to [facility] emergency department and is unable to provide history due to his clinical condition.  Interim history Admitted with  acute renal failure on chronic kidney disease, stage V secondary to urinary obstruction and dehydration with electrolyte abnormalities.  Nephrology consulted.  Patient did undergo hemodialysis.  IR placed bilateral percutaneous nephrostomy tubes.  Creatinine did improve with a couple cycles of hemodialysis however plateaued.  Hemodialysis catheter was then removed.  No further plans for hemodialysis at this time.  Nephrology feels that patient can be discharged home with outpatient follow-up.  At this time, discharge is complicated by patient being extremely weak and deconditioned.  Initially patient did not want to be discharged to SNF however this morning he states that he will consider it.   Subjective: Awakened from sleep and is reporting nausea improved with scheduled Zofran.  Objective: Vitals:   04/17/20 0201 04/17/20 0511  BP:  103/62  Pulse: 76 61  Resp: 16 12  Temp: 98.1 F (36.7 C) 98.1 F (36.7 C)  SpO2: 97% 98%    Intake/Output Summary (Last 24 hours) at 04/17/2020 0758 Last data filed at 04/17/2020 0200 Gross per 24 hour  Intake --  Output 800 ml  Net -800 ml   Filed Weights   04/15/20 0600 04/16/20 0500 04/17/20 0511  Weight: 52.9 kg 53.1 kg 50.4 kg    Exam:  Constitutional: very cachectic in appearance but otherwise in no acute distress and is calm Respiratory: Stable on room air.  Bilateral lung sounds clear to auscultation. Cardiovascular: Regular.  Heart sounds normal S1-S2, skin is pale but does have warm extremities with adequate capillary refill Abdomen: JP drain to left abdomen  with opaque fluid noted. Genitourinary: Bilateral percutaneous nephrostomy tubes in place draining clear yellow urine to collection bag; also has chronic Foley catheter draining yellow urine to bedside bag Neurologic: CN 2-12 grossly intact. Sensation intact, DTR normal. Strength 5/5 x all 4 extremities.  Psychiatric: Normal judgment and insight. Alert and oriented x 3.  Flat mood and  affect.    Assessment/Plan: Acute problems: Acute renal failure on chronic kidney disease, stage V -Nephrology consulted and appreciated -Creatinine 5.76 in October 2021, although patient presented with a creatinine of 16.69 -Chronic kidney disease likely secondary to longstanding obstruction.  Acute kidney injury due to dehydration and sepsis along with ATN with obstruction. -Patient also noted to have severe electrolyte derangements with severe acidosis and likely uremia on presentation. -Interventional radiology consulted and appreciated for bilateral percutaneous nephrostomy tube placement, 04/01/2020 as well as temporary HD catheter placement. -Patient did undergo hemodialysis, 2 cycles with last hemodialysis on 04/04/2019. -Creatinine has now plateaued, currently 5.08 -Though creatinine is still elevated, it is below his baseline.  Patient himself is unsure about wanting continuous HD.  Nephrology recommended that patient follow-up with nephrology as an outpatient.  Recommended continuing bicarb on discharge.  Bilateral hydronephrosis -Per IR follow-up on 04/07/2019, patient had urinary obstruction secondary to pelvic mass with subsequent bilateral hydronephrosis in the setting of bilateral ureteral stents, bilateral PCNs inadvertently removed (unknown how long they have been out), status post bilateral PCN placement on 04/01/2020. Alinda Money and plans on proceeding with concurrent procedure with surgical team.  He will perform cystoscopy and bilateral ureteral stent change (see below)-tentatively pending for 1/18 -He also has a chronic Foley catheter as well. -Also has history of Hodgkin's lymphoma and is status postchemotherapy and radiation which may have also contributed to recurrent obstructive renal disease  History of pelvic abscess/known colovesical fistula -Noted on 11/15/2019, status post left transgluteal drain by IR/colovesicular fistula -The surgical team has reevaluated patient on 1/12  and plan is to proceed with diverting loop colostomy on 1/18. Please see their note for details-patient will have concurrent urological procedure done by Dr. Alinda Money -Patient supposed to follow-up with gastroenterology, Dr. Rush Landmark -Cont scheduled Zofran for recurrent nausea  Severe physical deconditioning, history of prior CVA -July 2021, history of atrial fibrillation, patient is not a candidate for anticoagulation due to GI bleed and thrombocytopenia -Currently no focal deficits  Severe protein calorie malnutrition Nutrition Problem: Severe Malnutrition Etiology: chronic illness (colovesical fistula) Signs/Symptoms: moderate fat depletion,severe fat depletion,moderate muscle depletion,severe muscle depletion Interventions: MVI,Refer to RD note for recommendations Estimated body mass index is 17.93 kg/m as calculated from the following:   Height as of this encounter: 5\' 6"  (1.676 m).   Weight as of this encounter: 50.4 kg.  Chronic pain -Continue low-dose Percocet along with Neurontin -Discussed with nephrology and pharmacy- continue low-dose Zanaflex   Other problems: Severe dehydration with multiple metabolic derangements as follows: Severe metabolic acidosis, hyperkalemia, hyperphosphatemia -Resolved after rehydration, dialysis and Kayexalate  Severe sepsis secondary to urinary tract infection -Present on admission-sepsis physiology has now resolved -Patient presented with encephalopathy as well as hypothermia and leukocytosis -Blood cultures were negative x2 -Urine culture from 12/31 and 1/1 showed multiple species -CT renal stone study 12/31: Bladder wall appeared slightly thickened.  No significant fluid collection in pelvis -Patient completed 7 days of IV cefepime  Goals of care -Palliative care consulted and appreciated -Patient full code and refused to discuss any decisions regarding plan of care -Patient should follow up with palliative care as an  outpatient  PTSD -Sedatives held  History of gastric bypass  -Avoid NSAIDs (unable to use currently in context of chronic kidney disease)  Data Reviewed: Basic Metabolic Panel: Recent Labs  Lab 04/11/20 0334 04/12/20 0335 04/17/20 0307  NA 136 137 137  K 4.9 4.8 4.6  CL 104 106 103  CO2 22 20* 25  GLUCOSE 83 82 95  BUN 41* 42* 29*  CREATININE 5.08* 5.15* 4.97*  CALCIUM 7.7* 7.7* 8.3*   Liver Function Tests: Recent Labs  Lab 04/17/20 0307  AST 33  ALT 26  ALKPHOS 122  BILITOT 0.5  PROT 6.4*  ALBUMIN 2.3*   No results for input(s): LIPASE, AMYLASE in the last 168 hours. No results for input(s): AMMONIA in the last 168 hours. CBC: Recent Labs  Lab 04/11/20 0334 04/17/20 0307  WBC  --  6.8  HGB 8.6* 9.4*  HCT 27.8* 31.3*  MCV  --  97.5  PLT  --  341   Cardiac Enzymes: No results for input(s): CKTOTAL, CKMB, CKMBINDEX, TROPONINI in the last 168 hours. BNP (last 3 results) No results for input(s): BNP in the last 8760 hours.  ProBNP (last 3 results) No results for input(s): PROBNP in the last 8760 hours.  CBG: Recent Labs  Lab 04/16/20 1150 04/16/20 1603 04/16/20 2218 04/17/20 0205 04/17/20 0506  GLUCAP 90 91 130* 88 97    Recent Results (from the past 240 hour(s))  Surgical pcr screen     Status: None   Collection Time: 04/13/20  4:48 AM   Specimen: Nasal Mucosa; Nasal Swab  Result Value Ref Range Status   MRSA, PCR NEGATIVE NEGATIVE Final   Staphylococcus aureus NEGATIVE NEGATIVE Final    Comment: (NOTE) The Xpert SA Assay (FDA approved for NASAL specimens in patients 76 years of age and older), is one component of a comprehensive surveillance program. It is not intended to diagnose infection nor to guide or monitor treatment. Performed at [facility], [place] [address], [place], [place] [address]      Studies: No results found.  Scheduled Meds: . [START ON 04/18/2020] acetaminophen  1,000 mg Oral On Call to OR  . calcium  carbonate  1 tablet Oral TID  . Chlorhexidine Gluconate Cloth  6 each Topical Daily  . DULoxetine  60 mg Oral Daily   And  . DULoxetine  30 mg Oral QHS  . heparin  5,000 Units Subcutaneous Q8H  . multivitamin with minerals  1 tablet Oral BID  . ondansetron  4 mg Oral Q6H   Or  . ondansetron (ZOFRAN) IV  4 mg Intravenous Q6H  . pantoprazole  40 mg Oral QHS  . senna-docusate  1 tablet Oral BID  . sodium bicarbonate  1,300 mg Oral TID  . sodium chloride flush  5 mL Intracatheter Q8H  . tamsulosin  0.4 mg Oral QHS   Continuous Infusions:   Principal Problem:   Sepsis with acute renal failure without septic shock (HCC) Active Problems:   CKD (chronic kidney disease), stage IV (HCC)   Chronic pain   Protein-calorie malnutrition, severe   Colovesical fistula   Acute renal failure (HCC)   History of COVID-19   History of gastric bypass   Hodgkin lymphoma, unspecified, unspecified site (HCC)   Metabolic acidosis   Toxic metabolic encephalopathy   PTSD (post-traumatic stress disorder)   Bilateral ureteral obstruction s/p perc nephrosotmy & stenting   History of CVA (cerebrovascular accident)   DNR (do not resuscitate) discussion  Palliative care by specialist   Adult failure to thrive   Consultants:    Procedures:    Antibiotics: Anti-infectives (From admission, onward)   Start     Dose/Rate Route Frequency Ordered Stop   04/13/20 1145  cefoTEtan (CEFOTAN) 2 g in sodium chloride 0.9 % 100 mL IVPB        2 g 200 mL/hr over 30 Minutes Intravenous On call to O.R. 04/12/20 2006 04/14/20 0559   04/12/20 2100  neomycin (MYCIFRADIN) tablet 1,000 mg       "And" Linked Group Details   1,000 mg Oral 3 times per day 04/12/20 2006 04/13/20 0527   04/12/20 2100  metroNIDAZOLE (FLAGYL) tablet 1,000 mg       "And" Linked Group Details   1,000 mg Oral 3 times per day 04/12/20 2006 04/13/20 0527   04/03/20 1600  ceFEPIme (MAXIPIME) 1 g in sodium chloride 0.9 % 100 mL IVPB        1  g 200 mL/hr over 30 Minutes Intravenous Every 24 hours 04/03/20 0918 04/07/20 1108   04/01/20 1600  ceFEPIme (MAXIPIME) 1 g in sodium chloride 0.9 % 100 mL IVPB  Status:  Discontinued        1 g 200 mL/hr over 30 Minutes Intravenous Every 24 hours 03/31/20 1550 04/03/20 0918   04/01/20 1145  cefTRIAXone (ROCEPHIN) 1 g in sodium chloride 0.9 % 100 mL IVPB        1 g 200 mL/hr over 30 Minutes Intravenous  Once 04/01/20 1057 04/01/20 1308   03/31/20 1415  ceFEPIme (MAXIPIME) 2 g in sodium chloride 0.9 % 100 mL IVPB        2 g 200 mL/hr over 30 Minutes Intravenous  Once 03/31/20 1407 03/31/20 1516       Time spent:  20 minutes    Erin Hearing ANP  [facility] 7 am - 330 pm/M-F for direct patient care and secure chat Please refer to Amion for contact info 17  days   Addendum Patient seen and examined,  Agree with management and plan as outlined by Ms. Lissa Merlin.    Estill Cotta M.D.  [facility] 04/17/2020, 12:37 PM

## 2020-04-17 NOTE — Progress Notes (Signed)
   Patient Name: Chase KYNARD Sr. Date of Encounter: 04/17/2020, 11:38 AM    Subjective  Ready to have surgery tomorrow - diverting colostomy and ureteral stent exchange. [name] asking for pre-op colonoscopy - pt agreeable    Objective  BP 103/66 (BP Location: Left Arm)   Pulse 64   Temp (!) 97.4 F (36.3 C) (Oral)   Resp (!) 9   Ht 5\' 6"  (1.676 m)   Wt 50.4 kg   SpO2 98%   BMI 17.93 kg/m  NAD bilat nephrostomy tubes and JP drain in place CBC Latest Ref Rng & Units 04/17/2020 04/11/2020 04/06/2020  WBC 4.0 - 10.5 K/uL 6.8 - 7.9  Hemoglobin 13.0 - 17.0 g/dL 9.4(L) 8.6(L) 8.3(L)  Hematocrit 39.0 - 52.0 % 31.3(L) 27.8(L) 24.3(L)  Platelets 150 - 400 K/uL 341 - 168       Assessment and Plan  Colovesical fistula and hx pelvic abscess w/ bilateral hydronephrosis - for OR tomorrow PM as above  Appropriate to do pre-op colonoscopy - in AM in endoscopy lab  The risks and benefits as well as alternatives of endoscopic procedure(s) have been discussed and reviewed. All questions answered. The patient agrees to proceed.   Gatha Mayer, MD, [facility] 04/17/2020 11:38 AM

## 2020-04-17 NOTE — Progress Notes (Addendum)
Patient stating is is willing to do diverting colostomy this week.  I have scheduled time Tuesday afternoon, tomorrow.    Urology aware of attempts to reschedule to Tuesday.  We will reach out to them and see if they can do stent exchange under cystoscopy at the same time.    Will see if GI can do flex sig/colonoscopy as well  I will d/w w patient at bedside later today   Chase Hector, MD, FACS, MASCRS Gastrointestinal and Minimally Invasive Surgery  [facility] Surgery [address] [place], [place] [address] [phone] Fax [phone] Main/Paging  CONTACT INFORMATION: Weekday (9AM-5PM) concerns: Call [facility] main office at [phone] Weeknight (5PM-9AM) or Weekend/Holiday concerns: Check [email or web address] for General Surgery [facility] coverage (Please, do not use SecureChat as it is not reliable communication to operating surgeons for immediate patient care)

## 2020-04-18 ENCOUNTER — Inpatient Hospital Stay (HOSPITAL_COMMUNITY): Payer: No Typology Code available for payment source | Admitting: Certified Registered Nurse Anesthetist

## 2020-04-18 ENCOUNTER — Other Ambulatory Visit: Payer: Self-pay

## 2020-04-18 ENCOUNTER — Inpatient Hospital Stay (HOSPITAL_COMMUNITY): Payer: No Typology Code available for payment source

## 2020-04-18 ENCOUNTER — Encounter (HOSPITAL_COMMUNITY)
Admission: EM | Disposition: A | Discharge status: Home Health | Payer: Self-pay | Source: Home / Self Care | Attending: Internal Medicine

## 2020-04-18 ENCOUNTER — Other Ambulatory Visit (HOSPITAL_COMMUNITY): Payer: No Typology Code available for payment source

## 2020-04-18 ENCOUNTER — Encounter (HOSPITAL_COMMUNITY): Payer: Self-pay | Admitting: Family Medicine

## 2020-04-18 DIAGNOSIS — J9601 Acute respiratory failure with hypoxia: Secondary | ICD-10-CM | POA: Diagnosis not present

## 2020-04-18 DIAGNOSIS — N170 Acute kidney failure with tubular necrosis: Secondary | ICD-10-CM | POA: Diagnosis not present

## 2020-04-18 DIAGNOSIS — N135 Crossing vessel and stricture of ureter without hydronephrosis: Secondary | ICD-10-CM | POA: Diagnosis not present

## 2020-04-18 DIAGNOSIS — K5732 Diverticulitis of large intestine without perforation or abscess without bleeding: Secondary | ICD-10-CM

## 2020-04-18 DIAGNOSIS — E43 Unspecified severe protein-calorie malnutrition: Secondary | ICD-10-CM | POA: Diagnosis not present

## 2020-04-18 DIAGNOSIS — N184 Chronic kidney disease, stage 4 (severe): Secondary | ICD-10-CM | POA: Diagnosis not present

## 2020-04-18 DIAGNOSIS — N321 Vesicointestinal fistula: Secondary | ICD-10-CM | POA: Diagnosis not present

## 2020-04-18 DIAGNOSIS — R933 Abnormal findings on diagnostic imaging of other parts of digestive tract: Secondary | ICD-10-CM

## 2020-04-18 DIAGNOSIS — Z933 Colostomy status: Secondary | ICD-10-CM

## 2020-04-18 DIAGNOSIS — N179 Acute kidney failure, unspecified: Secondary | ICD-10-CM | POA: Diagnosis not present

## 2020-04-18 HISTORY — DX: Diverticulitis of large intestine without perforation or abscess without bleeding: K57.32

## 2020-04-18 HISTORY — PX: COLECTOMY WITH COLOSTOMY CREATION/HARTMANN PROCEDURE: SHX6598

## 2020-04-18 HISTORY — PX: COLONOSCOPY WITH PROPOFOL: SHX5780

## 2020-04-18 HISTORY — PX: LAPAROSCOPIC LOOP COLOSTOMY: SHX6816

## 2020-04-18 HISTORY — PX: EPIGASTRIC HERNIA REPAIR: SHX404

## 2020-04-18 HISTORY — PX: CYSTOSCOPY WITH URETEROSCOPY AND STENT PLACEMENT: SHX6377

## 2020-04-18 LAB — PREPARE RBC (CROSSMATCH)

## 2020-04-18 LAB — POCT I-STAT, CHEM 8
BUN: 29 mg/dL — ABNORMAL HIGH (ref 6–20)
Calcium, Ion: 1.11 mmol/L — ABNORMAL LOW (ref 1.15–1.40)
Chloride: 105 mmol/L (ref 98–111)
Creatinine, Ser: 4.8 mg/dL — ABNORMAL HIGH (ref 0.61–1.24)
Glucose, Bld: 82 mg/dL (ref 70–99)
HCT: 33 % — ABNORMAL LOW (ref 39.0–52.0)
Hemoglobin: 11.2 g/dL — ABNORMAL LOW (ref 13.0–17.0)
Potassium: 4.2 mmol/L (ref 3.5–5.1)
Sodium: 136 mmol/L (ref 135–145)
TCO2: 23 mmol/L (ref 22–32)

## 2020-04-18 LAB — GLUCOSE, CAPILLARY: Glucose-Capillary: 94 mg/dL (ref 70–99)

## 2020-04-18 SURGERY — COLONOSCOPY WITH PROPOFOL
Anesthesia: Monitor Anesthesia Care

## 2020-04-18 SURGERY — CREATION, COLOSTOMY, LOOP, LAPAROSCOPIC
Anesthesia: General | Site: Urethra

## 2020-04-18 MED ORDER — ROCURONIUM BROMIDE 10 MG/ML (PF) SYRINGE
PREFILLED_SYRINGE | INTRAVENOUS | Status: AC
  Filled 2020-04-18: qty 10

## 2020-04-18 MED ORDER — PHENYLEPHRINE 40 MCG/ML (10ML) SYRINGE FOR IV PUSH (FOR BLOOD PRESSURE SUPPORT)
PREFILLED_SYRINGE | INTRAVENOUS | Status: DC | PRN
  Administered 2020-04-18 (×2): 80 ug via INTRAVENOUS

## 2020-04-18 MED ORDER — CALCIUM POLYCARBOPHIL 625 MG PO TABS
625.0000 mg | ORAL_TABLET | Freq: Two times a day (BID) | ORAL | Status: DC
  Administered 2020-04-19 – 2020-05-03 (×27): 625 mg via ORAL
  Filled 2020-04-18 (×32): qty 1

## 2020-04-18 MED ORDER — PHENYLEPHRINE 40 MCG/ML (10ML) SYRINGE FOR IV PUSH (FOR BLOOD PRESSURE SUPPORT)
PREFILLED_SYRINGE | INTRAVENOUS | Status: AC
  Filled 2020-04-18: qty 10

## 2020-04-18 MED ORDER — EPHEDRINE SULFATE-NACL 50-0.9 MG/10ML-% IV SOSY
PREFILLED_SYRINGE | INTRAVENOUS | Status: DC | PRN
  Administered 2020-04-18: 5 mg via INTRAVENOUS

## 2020-04-18 MED ORDER — PROMETHAZINE HCL 25 MG/ML IJ SOLN
6.2500 mg | Freq: Four times a day (QID) | INTRAMUSCULAR | Status: DC | PRN
  Administered 2020-04-18: 6.25 mg via INTRAVENOUS

## 2020-04-18 MED ORDER — ONDANSETRON HCL 4 MG/2ML IJ SOLN
INTRAMUSCULAR | Status: DC | PRN
  Administered 2020-04-18: 4 mg via INTRAVENOUS

## 2020-04-18 MED ORDER — ONDANSETRON HCL 4 MG/2ML IJ SOLN
4.0000 mg | Freq: Once | INTRAMUSCULAR | Status: AC
  Administered 2020-04-18: 4 mg via INTRAVENOUS

## 2020-04-18 MED ORDER — SODIUM CHLORIDE 0.9 % IV SOLN: INTRAVENOUS | Status: DC

## 2020-04-18 MED ORDER — MAGIC MOUTHWASH
15.0000 mL | Freq: Four times a day (QID) | ORAL | Status: DC | PRN
  Filled 2020-04-18: qty 15

## 2020-04-18 MED ORDER — FENTANYL CITRATE (PF) 100 MCG/2ML IJ SOLN: 25.0000 ug | INTRAMUSCULAR | Status: DC | PRN

## 2020-04-18 MED ORDER — SUCCINYLCHOLINE CHLORIDE 20 MG/ML IJ SOLN
INTRAMUSCULAR | Status: DC | PRN
  Administered 2020-04-18: 120 mg via INTRAVENOUS

## 2020-04-18 MED ORDER — LIDOCAINE 2% (20 MG/ML) 5 ML SYRINGE
INTRAMUSCULAR | Status: AC
  Filled 2020-04-18: qty 5

## 2020-04-18 MED ORDER — MIDAZOLAM HCL 5 MG/5ML IJ SOLN
INTRAMUSCULAR | Status: DC | PRN
  Administered 2020-04-18: 2 mg via INTRAVENOUS

## 2020-04-18 MED ORDER — SUCCINYLCHOLINE CHLORIDE 200 MG/10ML IV SOSY
PREFILLED_SYRINGE | INTRAVENOUS | Status: AC
  Filled 2020-04-18: qty 10

## 2020-04-18 MED ORDER — TRAMADOL HCL 50 MG PO TABS
50.0000 mg | ORAL_TABLET | Freq: Four times a day (QID) | ORAL | Status: DC | PRN
  Administered 2020-04-19 (×2): 100 mg via ORAL
  Filled 2020-04-18 (×3): qty 2

## 2020-04-18 MED ORDER — SUGAMMADEX SODIUM 200 MG/2ML IV SOLN
INTRAVENOUS | Status: DC | PRN
  Administered 2020-04-18 (×2): 100 mg via INTRAVENOUS

## 2020-04-18 MED ORDER — SODIUM CHLORIDE 0.9% FLUSH
3.0000 mL | Freq: Two times a day (BID) | INTRAVENOUS | Status: DC
  Administered 2020-04-19 – 2020-05-02 (×16): 3 mL via INTRAVENOUS

## 2020-04-18 MED ORDER — LACTATED RINGERS IV SOLN: INTRAVENOUS | Status: DC | PRN

## 2020-04-18 MED ORDER — BUPIVACAINE HCL (PF) 0.25 % IJ SOLN
INTRAMUSCULAR | Status: AC
  Filled 2020-04-18: qty 30

## 2020-04-18 MED ORDER — PROPOFOL 10 MG/ML IV BOLUS
INTRAVENOUS | Status: DC | PRN
  Administered 2020-04-18: 30 mg via INTRAVENOUS

## 2020-04-18 MED ORDER — PROPOFOL 10 MG/ML IV BOLUS
INTRAVENOUS | Status: AC
  Filled 2020-04-18: qty 20

## 2020-04-18 MED ORDER — PROPOFOL 10 MG/ML IV BOLUS
INTRAVENOUS | Status: DC | PRN
  Administered 2020-04-18: 130 mg via INTRAVENOUS

## 2020-04-18 MED ORDER — BUPIVACAINE LIPOSOME 1.3 % IJ SUSP
INTRAMUSCULAR | Status: DC | PRN
  Administered 2020-04-18: 20 mL

## 2020-04-18 MED ORDER — MIDAZOLAM HCL 2 MG/2ML IJ SOLN
INTRAMUSCULAR | Status: AC
  Filled 2020-04-18: qty 2

## 2020-04-18 MED ORDER — SODIUM CHLORIDE 0.9 % IV SOLN
2.0000 g | Freq: Two times a day (BID) | INTRAVENOUS | Status: AC
  Administered 2020-04-19: 2 g via INTRAVENOUS
  Filled 2020-04-18: qty 2

## 2020-04-18 MED ORDER — ACETAMINOPHEN 325 MG PO TABS: 325.0000 mg | ORAL_TABLET | Freq: Four times a day (QID) | ORAL | Status: DC | PRN

## 2020-04-18 MED ORDER — PROMETHAZINE HCL 25 MG/ML IJ SOLN
INTRAMUSCULAR | Status: AC
  Filled 2020-04-18: qty 1

## 2020-04-18 MED ORDER — KETAMINE HCL 50 MG/5ML IJ SOSY
PREFILLED_SYRINGE | INTRAMUSCULAR | Status: AC
  Filled 2020-04-18: qty 5

## 2020-04-18 MED ORDER — ONDANSETRON HCL 4 MG/2ML IJ SOLN
INTRAMUSCULAR | Status: AC
  Filled 2020-04-18: qty 2

## 2020-04-18 MED ORDER — SODIUM CHLORIDE 0.9 % IV SOLN: INTRAVENOUS | Status: DC | PRN

## 2020-04-18 MED ORDER — LIP MEDEX EX OINT
1.0000 "application " | TOPICAL_OINTMENT | Freq: Two times a day (BID) | CUTANEOUS | Status: DC
  Administered 2020-04-19 – 2020-05-03 (×29): 1 via TOPICAL
  Filled 2020-04-18 (×2): qty 7

## 2020-04-18 MED ORDER — PHENYLEPHRINE HCL-NACL 10-0.9 MG/250ML-% IV SOLN
INTRAVENOUS | Status: DC | PRN
  Administered 2020-04-18: 25 ug/min via INTRAVENOUS
  Administered 2020-04-18: 30 ug/min via INTRAVENOUS

## 2020-04-18 MED ORDER — CHLORHEXIDINE GLUCONATE 0.12 % MT SOLN
OROMUCOSAL | Status: AC
  Administered 2020-04-18: 15 mL
  Filled 2020-04-18: qty 15

## 2020-04-18 MED ORDER — HYDROMORPHONE HCL 1 MG/ML IJ SOLN: 0.2500 mg | INTRAMUSCULAR | Status: DC | PRN

## 2020-04-18 MED ORDER — FENTANYL CITRATE (PF) 250 MCG/5ML IJ SOLN
INTRAMUSCULAR | Status: AC
  Filled 2020-04-18: qty 5

## 2020-04-18 MED ORDER — SODIUM CHLORIDE 0.9 % IV SOLN
250.0000 mL | INTRAVENOUS | Status: DC | PRN
  Administered 2020-04-22: 250 mL via INTRAVENOUS

## 2020-04-18 MED ORDER — ALBUMIN HUMAN 5 % IV SOLN: INTRAVENOUS | Status: DC | PRN

## 2020-04-18 MED ORDER — SODIUM CHLORIDE 0.9% IV SOLUTION: Freq: Once | INTRAVENOUS | Status: DC

## 2020-04-18 MED ORDER — FENTANYL CITRATE (PF) 100 MCG/2ML IJ SOLN
INTRAMUSCULAR | Status: DC | PRN
  Administered 2020-04-18: 50 ug via INTRAVENOUS
  Administered 2020-04-18: 100 ug via INTRAVENOUS
  Administered 2020-04-18: 25 ug via INTRAVENOUS
  Administered 2020-04-18: 75 ug via INTRAVENOUS

## 2020-04-18 MED ORDER — LIDOCAINE HCL URETHRAL/MUCOSAL 2 % EX GEL
CUTANEOUS | Status: AC
  Filled 2020-04-18: qty 11

## 2020-04-18 MED ORDER — DEXAMETHASONE SODIUM PHOSPHATE 10 MG/ML IJ SOLN
INTRAMUSCULAR | Status: DC | PRN
  Administered 2020-04-18: 8 mg via INTRAVENOUS

## 2020-04-18 MED ORDER — LIDOCAINE IN D5W 4-5 MG/ML-% IV SOLN
INTRAVENOUS | Status: DC | PRN
  Administered 2020-04-18: 25 ug/kg/min via INTRAVENOUS

## 2020-04-18 MED ORDER — LIDOCAINE 2% (20 MG/ML) 5 ML SYRINGE
INTRAMUSCULAR | Status: DC | PRN
  Administered 2020-04-18: 100 mg via INTRAVENOUS

## 2020-04-18 MED ORDER — PHENYLEPHRINE HCL (PRESSORS) 10 MG/ML IV SOLN
INTRAVENOUS | Status: DC | PRN
  Administered 2020-04-18: 120 ug via INTRAVENOUS
  Administered 2020-04-18: 80 ug via INTRAVENOUS
  Administered 2020-04-18: 120 ug via INTRAVENOUS

## 2020-04-18 MED ORDER — PROCHLORPERAZINE MALEATE 5 MG PO TABS
5.0000 mg | ORAL_TABLET | Freq: Four times a day (QID) | ORAL | Status: DC | PRN
  Administered 2020-04-20 – 2020-04-26 (×9): 10 mg via ORAL
  Filled 2020-04-18 (×10): qty 2

## 2020-04-18 MED ORDER — BUPIVACAINE HCL (PF) 0.25 % IJ SOLN
INTRAMUSCULAR | Status: DC | PRN
  Administered 2020-04-18: 10 mL
  Administered 2020-04-18: 23 mL

## 2020-04-18 MED ORDER — SODIUM CHLORIDE 0.9% FLUSH: 3.0000 mL | INTRAVENOUS | Status: DC | PRN

## 2020-04-18 MED ORDER — PROPOFOL 500 MG/50ML IV EMUL
INTRAVENOUS | Status: DC | PRN
  Administered 2020-04-18: 50 ug/kg/min via INTRAVENOUS

## 2020-04-18 MED ORDER — ENSURE SURGERY PO LIQD
237.0000 mL | Freq: Two times a day (BID) | ORAL | Status: DC
  Filled 2020-04-18 (×9): qty 237

## 2020-04-18 MED ORDER — ACETAMINOPHEN 500 MG PO TABS
500.0000 mg | ORAL_TABLET | Freq: Four times a day (QID) | ORAL | Status: DC
  Administered 2020-04-19 (×3): 500 mg via ORAL
  Filled 2020-04-18 (×3): qty 1

## 2020-04-18 MED ORDER — KETAMINE HCL 10 MG/ML IJ SOLN
INTRAMUSCULAR | Status: DC | PRN
  Administered 2020-04-18: 30 mg via INTRAVENOUS
  Administered 2020-04-18: 10 mg via INTRAVENOUS

## 2020-04-18 MED ORDER — ROCURONIUM BROMIDE 10 MG/ML (PF) SYRINGE
PREFILLED_SYRINGE | INTRAVENOUS | Status: DC | PRN
  Administered 2020-04-18: 15 mg via INTRAVENOUS
  Administered 2020-04-18: 60 mg via INTRAVENOUS

## 2020-04-18 SURGICAL SUPPLY — 72 items
APL PRP STRL LF DISP 70% ISPRP (MISCELLANEOUS) ×3
BAG URO CATCHER STRL LF (MISCELLANEOUS) ×2 IMPLANT
BLADE CLIPPER SURG (BLADE) ×2 IMPLANT
BLADE SURG 10 STRL SS (BLADE) ×2 IMPLANT
CANISTER SUCT 3000ML PPV (MISCELLANEOUS) ×4 IMPLANT
CATH INTERMIT  6FR 70CM (CATHETERS) ×2 IMPLANT
CATH URET 5FR 28IN OPEN ENDED (CATHETERS) ×2 IMPLANT
CHLORAPREP W/TINT 26 (MISCELLANEOUS) ×4 IMPLANT
COVER SURGICAL LIGHT HANDLE (MISCELLANEOUS) ×4 IMPLANT
COVER WAND RF STERILE (DRAPES) ×8 IMPLANT
DRAPE LAPAROSCOPIC ABDOMINAL (DRAPES) ×4 IMPLANT
DRAPE WARM FLUID 44X44 (DRAPES) ×4 IMPLANT
DRSG TEGADERM 2-3/8X2-3/4 SM (GAUZE/BANDAGES/DRESSINGS) ×6 IMPLANT
DRSG TEGADERM 4X4.75 (GAUZE/BANDAGES/DRESSINGS) ×2 IMPLANT
ELECT BLADE 6.5 EXT (BLADE) ×2 IMPLANT
ELECT REM PT RETURN 9FT ADLT (ELECTROSURGICAL) ×4
ELECTRODE REM PT RTRN 9FT ADLT (ELECTROSURGICAL) ×3 IMPLANT
FIBER LASER TRAC TIP (UROLOGICAL SUPPLIES) ×2 IMPLANT
GAUZE SPONGE 2X2 8PLY STRL LF (GAUZE/BANDAGES/DRESSINGS) ×1 IMPLANT
GLOVE BIO SURGEON STRL SZ 6 (GLOVE) ×4 IMPLANT
GLOVE BIOGEL M STRL SZ7.5 (GLOVE) ×4 IMPLANT
GLOVE INDICATOR 6.5 STRL GRN (GLOVE) ×4 IMPLANT
GOWN STRL REUS W/ TWL LRG LVL3 (GOWN DISPOSABLE) ×12 IMPLANT
GOWN STRL REUS W/TWL 2XL LVL3 (GOWN DISPOSABLE) ×4 IMPLANT
GOWN STRL REUS W/TWL LRG LVL3 (GOWN DISPOSABLE) ×16
GUIDEWIRE ANG ZIPWIRE 038X150 (WIRE) IMPLANT
GUIDEWIRE ANGLED .035X150CM (WIRE) ×2 IMPLANT
GUIDEWIRE STR DUAL SENSOR (WIRE) ×4 IMPLANT
HANDLE SUCTION POOLE (INSTRUMENTS) ×3 IMPLANT
KIT BASIN OR (CUSTOM PROCEDURE TRAY) ×4 IMPLANT
KIT OSTOMY DRAINABLE 2.75 STR (WOUND CARE) ×6 IMPLANT
KIT TURNOVER KIT B (KITS) ×4 IMPLANT
MANIFOLD NEPTUNE II (INSTRUMENTS) ×8 IMPLANT
NDL INSUFFLATION 14GA 120MM (NEEDLE) IMPLANT
NEEDLE 22X1 1/2 (OR ONLY) (NEEDLE) ×2 IMPLANT
NEEDLE INSUFFLATION 14GA 120MM (NEEDLE) ×4 IMPLANT
NS IRRIG 1000ML POUR BTL (IV SOLUTION) ×8 IMPLANT
PACK CYSTO (CUSTOM PROCEDURE TRAY) ×4 IMPLANT
PACK LAPAROSCOPY BASIN (CUSTOM PROCEDURE TRAY) ×2 IMPLANT
PAD ARMBOARD 7.5X6 YLW CONV (MISCELLANEOUS) ×4 IMPLANT
PENCIL BUTTON HOLSTER BLD 10FT (ELECTRODE) ×2 IMPLANT
PENCIL SMOKE EVACUATOR (MISCELLANEOUS) ×4 IMPLANT
RELOAD STAPLE 60 3.6 BLU REG (STAPLE) IMPLANT
RELOAD STAPLER BLUE 60MM (STAPLE) ×6 IMPLANT
RTRCTR WOUND ALEXIS 18CM SML (INSTRUMENTS) ×4
SAVER CELL AAL HAEMONETICS (INSTRUMENTS) ×1 IMPLANT
SCISSORS LAP 5X35 DISP (ENDOMECHANICALS) ×2 IMPLANT
SEALER TISSUE G2 STRG ARTC 35C (ENDOMECHANICALS) ×2 IMPLANT
SET IRRIG TUBING LAPAROSCOPIC (IRRIGATION / IRRIGATOR) ×2 IMPLANT
SET TUBE SMOKE EVAC HIGH FLOW (TUBING) ×2 IMPLANT
SHEATH URETERAL 12FRX35CM (MISCELLANEOUS) IMPLANT
SLEEVE ENDOPATH XCEL 5M (ENDOMECHANICALS) ×4 IMPLANT
SOL PREP POV-IOD 4OZ 10% (MISCELLANEOUS) ×4 IMPLANT
SPONGE GAUZE 2X2 STER 10/PKG (GAUZE/BANDAGES/DRESSINGS) ×1
SPONGE LAP 18X18 RF (DISPOSABLE) IMPLANT
STAPLE ECHEON FLEX 60 POW ENDO (STAPLE) ×2 IMPLANT
STAPLER RELOAD BLUE 60MM (STAPLE) ×8
STAPLER VISISTAT 35W (STAPLE) ×4 IMPLANT
SUCTION POOLE HANDLE (INSTRUMENTS) ×4
SUT MNCRL AB 4-0 PS2 18 (SUTURE) ×4 IMPLANT
SUT PDS AB 1 TP1 96 (SUTURE) ×8 IMPLANT
SUT PROLENE 2 0 SH 30 (SUTURE) ×2 IMPLANT
SUT VIC AB 2-0 SH 18 (SUTURE) ×8 IMPLANT
SUT VIC AB 3-0 SH 18 (SUTURE) ×4 IMPLANT
SUT VICRYL 0 AB UR-6 (SUTURE) ×2 IMPLANT
SUT VICRYL AB 2 0 TIES (SUTURE) ×4 IMPLANT
SUT VICRYL AB 3 0 TIES (SUTURE) ×4 IMPLANT
TOWEL GREEN STERILE (TOWEL DISPOSABLE) ×4 IMPLANT
TRAY FOLEY MTR SLVR 16FR STAT (SET/KITS/TRAYS/PACK) ×2 IMPLANT
TROCAR XCEL 12X100 BLDLESS (ENDOMECHANICALS) ×2 IMPLANT
TUBE CONNECTING 12X1/4 (SUCTIONS) ×6 IMPLANT
YANKAUER SUCT BULB TIP NO VENT (SUCTIONS) ×2 IMPLANT

## 2020-04-18 SURGICAL SUPPLY — 22 items

## 2020-04-18 NOTE — Anesthesia Preprocedure Evaluation (Addendum)
Anesthesia Evaluation  Patient identified by MRN, date of birth, ID band Patient awake    Reviewed: Allergy & Precautions, NPO status , Patient's Chart, lab work & pertinent test results  Airway Mallampati: II  TM Distance: >3 FB     Dental   Pulmonary shortness of breath,    breath sounds clear to auscultation       Cardiovascular + Past MI   Rhythm:Regular Rate:Normal  09/2019 ECHO: 60 to 65%. LV has normal function, no regional wall motion abnormalities, Grade I diastolic, no significant valvular abnormalities, mod pericardial effusion without tamponade   Neuro/Psych PSYCHIATRIC DISORDERS Anxiety Depression  Neuromuscular disease CVA    GI/Hepatic Neg liver ROS, GERD  Medicated,H/o gastric bypass   Endo/Other    Renal/GU ESRF and ARFRenal disease     Musculoskeletal  (+) Arthritis ,   Abdominal   Peds  Hematology  (+) anemia , H/o Hodgkin's   Anesthesia Other Findings   Reproductive/Obstetrics                            Anesthesia Physical Anesthesia Plan  ASA: III  Anesthesia Plan: MAC   Post-op Pain Management:    Induction: Intravenous  PONV Risk Score and Plan: 1 and Treatment may vary due to age or medical condition  Airway Management Planned: Natural Airway and Simple Face Mask  Additional Equipment: None  Intra-op Plan:   Post-operative Plan:   Informed Consent: I have reviewed the patients History and Physical, chart, labs and discussed the procedure including the risks, benefits and alternatives for the proposed anesthesia with the patient or authorized representative who has indicated his/her understanding and acceptance.     Dental advisory given  Plan Discussed with: CRNA and Anesthesiologist  Anesthesia Plan Comments:        Anesthesia Quick Evaluation

## 2020-04-18 NOTE — Progress Notes (Signed)
Per nurse report, pt refused to drink his moviprep.  This nurse observed a full 240 ml cup with moviprep drink at bedside and a half way moviprep cup next to cup.  Pt stated that he cannot drink it.  Pt requested for pain medication at this time.  Call bell within reach. Will continue to monitor.

## 2020-04-18 NOTE — Progress Notes (Signed)
Pt refused bowel rep  D/w Dr Loletha Carrow Will try & see if pt will do some enemas to do a flex sig & evaluate the sigmoid lumen & r/o malignancy  Chase Hector, MD, FACS, MASCRS Gastrointestinal and Minimally Invasive Surgery  [facility] Surgery [address], [place], [place] [address] [phone] Fax [phone] Main/Paging  CONTACT INFORMATION: Weekday (9AM-5PM) concerns: Call [facility] main office at [phone] Weeknight (5PM-9AM) or Weekend/Holiday concerns: Check [email or web address] for General Surgery [facility] coverage (Please, do not use SecureChat as it is not reliable communication to operating surgeons for immediate patient care)

## 2020-04-18 NOTE — Op Note (Signed)
[facility] Patient Name: Chase [name] Procedure Date : 04/18/2020 MRN: [id number] Attending MD: Estill Cotta. Loletha Carrow , MD Date of Birth: 1966-03-15 CSN: [id number] Age: 55 Admit Type: Inpatient Procedure:                Flexible Sigmoidoscopy Indications:              Abnormal CT of the GI tract (suspected                            colo-vesicular fistula with urinary obstruction.                            going to OR today for diverting colostomy. surgeon                            requesting endoscopic evaluation for possible                            colonic malignancy) Providers:                Mallie Mussel L. Loletha Carrow, MD, Nelia Shi, RN, Kary Kos RN, RN, Ladona Ridgel, Technician, Cira Servant, CRNA Referring MD:             Michael Boston MD Medicines:                Monitored Anesthesia Care Complications:            No immediate complications. Estimated Blood Loss:     Estimated blood loss: none. Procedure:                Pre-Anesthesia Assessment:                           - Prior to the procedure, a History and Physical                            was performed, and patient medications and                            allergies were reviewed. The patient's tolerance of                            previous anesthesia was also reviewed. The risks                            and benefits of the procedure and the sedation                            options and risks were discussed with the patient.                            All  questions were answered, and informed consent                            was obtained. Prior Anticoagulants: The patient has                            taken no previous anticoagulant or antiplatelet                            agents except for aspirin. ASA Grade Assessment: IV                            - A patient with severe systemic disease that is a                            constant  threat to life. After reviewing the risks                            and benefits, the patient was deemed in                            satisfactory condition to undergo the procedure.                           After obtaining informed consent, the scope was                            passed under direct vision. The PCF-H190DL                            ([id number] Olympus pediatric colonscope was                            introduced through the anus and advanced to the the                            left transverse colon. After obtaining informed                            consent, the scope was passed under direct                            vision.The flexible sigmoidoscopy was accomplished                            without difficulty. The patient tolerated the                            procedure well. The quality of the bowel                            preparation was poor. Scope In: 10:49:32 AM Scope Out: 39:03:00 AM Total Procedure Duration: 0 hours 6 minutes 30 seconds  Findings:  The perianal and digital rectal examinations were normal.      A large amount of semi-solid and opaque liquid stool was found in the       entire colon, interfering with visualization.      The exam was otherwise without abnormality. Impression:               - Preparation of the colon was poor.                           - Stool in the entire examined colon.                           - The examination was otherwise normal.                           - No specimens collected.                           No obvious malignancy seen in the left colon, given                            significant limitations of poor preparation. Moderate Sedation:      MAC sedation used Recommendation:           - Transport patient to OR for planned resection.                           - NPO.                           - Results were conveyed to [name]. Procedure Code(s):        --- Professional ---                            [phone], Sigmoidoscopy, flexible; diagnostic,                            including collection of specimen(s) by brushing or                            washing, when performed (separate procedure) Diagnosis Code(s):        --- Professional ---                           R93.3, Abnormal findings on diagnostic imaging of                            other parts of digestive tract CPT copyright 2019 American Medical Association. All rights reserved. The codes documented in this report are preliminary and upon coder review may  be revised to meet current compliance requirements. Vinod Mikesell L. Loletha Carrow, MD 04/18/2020 11:04:50 AM This report has been signed electronically. Number of Addenda: 0

## 2020-04-18 NOTE — Consult Note (Signed)
[place] Nurse ostomy consult note Consult received for new colostomy.  Will see in am for initial pouch change and assessment of stoma.  [place] nursing team will follow, and will remain available to this patient, the nursing, surgical and medical teams.  Thanks, Maudie Flakes, MSN, RN, [place], Arther Abbott  Pager# [phone]

## 2020-04-18 NOTE — Anesthesia Postprocedure Evaluation (Signed)
Anesthesia Post Note  Patient: DYSHON PHILBIN Sr.  Procedure(s) Performed: LAPAROSCOPIC SIGMOID COLOTOMY WITH COLOSTOMY (N/A Abdomen) HERNIA REPAIR EPIGASTRIC ADULT (N/A Abdomen) CYSTOSCOPY WITH BILATERAL URETERAL STENT CHANGE PLACEMENT (N/A Urethra)     Patient location during evaluation: PACU Anesthesia Type: General Level of consciousness: awake Pain management: pain level controlled Respiratory status: spontaneous breathing Cardiovascular status: stable Postop Assessment: no apparent nausea or vomiting Anesthetic complications: no   No complications documented.  Last Vitals:  Vitals:   04/18/20 1645 04/18/20 1700  BP: 122/78 125/80  Pulse: 76 77  Resp: 10 12  Temp: (!) 36.3 C   SpO2: 100% 99%    Last Pain:  Vitals:   04/18/20 1700  TempSrc:   PainSc: 0-No pain                 Donavon Kimrey

## 2020-04-18 NOTE — Op Note (Signed)
04/18/2020  3:47 PM  PATIENT:  Chase Saxon Sr.  55 y.o. 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  POST-OPERATIVE DIAGNOSIS:   SIGMOID DIVERTICULITIS EPIGASTRIC INCISIONAL HERNIA  PROCEDURE:   LAPAROSCOPIC SIGMOID COLOTOMY WITH COLOSTOMY (HARTMANN) INCISIONAL EPIGASTRIC HERNIA REPAIR - PRIMARY SUTURE  SURGEON:  Adin Hector, MD  ASSISTANT: Romana Juniper, MD   ANESTHESIA:     General  Nerve block provided with liposomal bupivacaine (Experel) mixed with 0.25% bupivacaine as a Local field block at port sites & extraction wound  EBL:  Total I/O In: 1950 [I.V.:1600; IV Piggyback:350] Out: 625 [Urine:375; Other:150; Blood:100]  Delay start of Pharmacological VTE agent (>24hrs) due to surgical blood loss or risk of bleeding:  no  DRAINS: NONE (chronic transgluteal drain removed)    SPECIMEN:  SIGMOID COLON (stitch proximal)  DISPOSITION OF SPECIMEN:  PATHOLOGY  COUNTS:  YES  PLAN OF CARE: Admit to inpatient   PATIENT DISPOSITION:  OR - intubated & stable  INDICATION:    Patient was perforation tween colon and bladder concerning for diverticulitis with abscess formation.  Worsening kidney disease requiring stenting for ureters.  Lost to follow-up.  Worsening failure to thrive and deconditioning.  Concern for persistent diverticulitis and probable colovesical fistula by urology and surgery.  I recommended fecal diversion and  possible segmental resection:  The anatomy & physiology of the digestive tract was discussed.  The pathophysiology was discussed.  Natural history risks without surgery was discussed.   I worked to give an overview of the disease and the frequent need to have multispecialty involvement.  I feel the risks of no intervention will lead to serious problems that outweigh the operative risks; therefore, I recommended a partial colectomy to remove the pathology.  Laparoscopic & open techniques were discussed.   Risks such as bleeding, infection, abscess, leak, reoperation, possible ostomy, hernia, heart attack, death, and other risks were discussed.  I noted a good likelihood this will help address the problem.   Goals of post-operative recovery were discussed as well.  We will work to minimize complications.  Educational materials on the pathology had been given in the office.  Questions were answered.    The patient expressed understanding & wished to proceed with surgery.  OR FINDINGS:   Patient had very redundant stretched out sigmoid colon.  Inflammation at the descending/sigmoid junction along the left lateral pelvis.  Only real area of adhesion to the pelvis.  No strong evidence of any active colovesical fistula.  Hartmann resection done of redundant sigmoid colon.  Prolene sutures at rectal stump.  It is an end descending colostomy in the left upper paramedian region at premarked location just inferior to intraperitoneal polypropylene mesh from prior incisional hernia repair   1 cm epigastric hernia containing preperitoneal fat only.  Primarily repaired  DESCRIPTION:   Informed consent was confirmed.  The patient underwent general anaesthesia without difficulty.  The patient was positioned appropriately.  VTE prevention in place.  Patient had massive evacuation of liquid stool which was cleaned up.  Patient was carefully positioned in  lithotomy.  The patient's abdomen & perineum were clipped,  prepped, & draped in a sterile fashion.  Surgical timeout confirmed our plan.  The patient was positioned in reverse Trendelenburg.  Abdominal entry was gained using optical entry technique in the right upper abdomen with a varies needle given his thin abdominal wall and prior gastric bypass and hernia surgery.  Clean water drop test.  Good insufflation of carbon oxide.  No elevated end-tidal CO2.  I did end up rectal injury and a right paramedian upper quadrant port site   entry was clean.  Camera inspection revealed no injury.  Varies needle causing no injury.  Omentum adherent to upper midline and periumbilical mesh.  Freed adhesions off for visualization.  Extra ports were carefully placed under direct laparoscopic visualization.  Patient was positioned in in [place].  I reflected the greater omentum and the upper abdomen the small bowel in the upper abdomen.  The bladder seemed contracted but there were no sigmoid lesions to it.  The proximal sigmoid was adherent to the left pelvic brim with evidence inflammation.  This seemed to be the focus of prior diverticulitis.  Transgluteal drain could not be seen as had been positioned in the mid pelvis.  No active peritonitis.  Patient had a very redundant sigmoid colon.  We elevated it.  He had a very stretched out mesentery.  I was able to come through the rectosigmoid mesentery halfway up and free the rectosigmoid mesentery off the left retroperitoneum in a medial lateral fashion.  Could identify the left and right ureters with chronic stents within them.  Left those in the retroperitoneal position.  Patient had had prior left inguinal hernia repair mesh as well.  I freed adhesions off of that.  I alternated between lateral to medial and medial to lateral dissection.  Chose a region in the mid/distal descending colon that was above the inflammation.  I transected the left colon mesentery just distal to an obvious left colic pedicle towards this location,  preserving the left colic pedicle.  I then turned attention at the rectosigmoid junction.  Came through the visceral peritoneum at that junction at the level of the sacral promontory.  Found a window through there.  We there for the we then carefully alternate between medial and lateral mobilization to elevate the stretched out rectosigmoid mesentery and transected at closer to the colorectal side, leaving rectosigmoid mesentery and the retroperitoneum he was very thin cachectic with very little retroperitoneal visceral fat so we tried to leave for safe cuff.  Eventually transected the intervening mesentery from the preselected distal descending colon to the recctosigmoid junction.  I skeletonized the mesorectum at the junction at the proximal rectum using blunt dissection & bipolar EnSeal.  I transected at the rectosigmoid junction using a laparoscopic blue load 60 mm stapler.  I placed 2-0 Prolene sutures on each corner of the rectal cuff which retracted to the mid pelvis.  We did irrigation and inspection.  Ureters intact with no injury.  Hemostasis good.  No other surprises.  I created an extraction incision through the left upper paramedian premarked colostomy site after opening the skin and fascia transversely a little.  The site was just inferior to the left inferior corner of prior intraperitoneal polypropylene mesh.  That area was soft.  I placed a small wound protector.  We eviscerated the rectosigmoid colon to the distal descending colon.  Transected the intervening mesentery radially preserving good blood supply and assuring hemostasis.  Transected at the  distal descending colon and with a blue load stapler to have a 6cm tall ostomy stump.    We changed gloves.  Sterile unused instruments were used from this point out per colon SSI prevention protocol.  He had a small epigastric incisional hernia one of the port sites for his prior laparoscopic gastric bypass.  He did request me to repair that.  I  closed the fascia transversely using interrupted 0 Vicryl suture to good result.   I closed the 37mm port sites using Monocryl stitch and sterile dressing.   I then matured the colostomy in a Brooke fashion.  2-0 Vicryl suture at the level of the fascia to the colon and the deeper abdominal wall.  I then did 4 more sutures between the dermis and colon then transected the staple line.  I carried the sutures through the edge and tied those down to help eviscerate the mucosa for a good Brooke colostomy.  Did intervening sutures between skin/dermis and seromuscular:.  Tied those down.  He had had excellent oozing and blood supply from the mucosa.  At the end hemostasis was good.  Colostomy appliance placed.  Because he was diverted and he did not have much output from his left transgluteal pelvic drain, I removed it.  I left the bilateral  flank nephrostomy tubes in place and his Foley catheter in place.  I will defer to urology  At this point urology came in.  They are planning to do cystoscopy with stent exchanges and Foley exchange.  I have left the room for them to take over the case.  Please see Dr. Herrick/urology separate note.  I discussed operative findings, updated the patient's status, discussed probable steps to recovery, and gave postoperative recommendations to the patient's son, Chase [name] (per the patient's request).  Recommendations were made.  Questions were answered.  He expressed understanding & appreciation.       Adin Hector, M.D., F.A.C.S. Gastrointestinal and Minimally Invasive Surgery [facility] [address] [place], [place] [address] [phone] Main / Paging

## 2020-04-18 NOTE — Anesthesia Procedure Notes (Signed)
Arterial Line Insertion Start/End1/18/2022 12:55 PM Performed by: Janace Litten, CRNA, CRNA  Preanesthetic checklist: patient identified, IV checked, monitors and equipment checked and pre-op evaluation Lidocaine 1% used for infiltration and patient sedated Left, radial was placed Catheter size: 20 G Hand hygiene performed  and maximum sterile barriers used   Attempts: 1 Procedure performed without using ultrasound guided technique. Following insertion, dressing applied and Biopatch. Post procedure assessment: normal  Patient tolerated the procedure well with no immediate complications.

## 2020-04-18 NOTE — Transfer of Care (Signed)
Immediate Anesthesia Transfer of Care Note  Patient: Chase HUTCHINSON Sr.  Procedure(s) Performed: COLONOSCOPY WITH PROPOFOL (N/A )  Patient Location: PACU and Endoscopy Unit  Anesthesia Type:MAC  Level of Consciousness: drowsy and patient cooperative  Airway & Oxygen Therapy: Patient Spontanous Breathing and Patient connected to face mask oxygen  Post-op Assessment: Report given to RN and Post -op Vital signs reviewed and stable  Post vital signs: Reviewed and stable  Last Vitals:  Vitals Value Taken Time  BP 126/45 04/18/20 1106  Temp    Pulse 76 04/18/20 1107  Resp 12 04/18/20 1107  SpO2 100 % 04/18/20 1107  Vitals shown include unvalidated device data.  Last Pain:  Vitals:   04/18/20 1020  TempSrc: Oral  PainSc: 0-No pain      Patients Stated Pain Goal: 4 (34/03/52 4818)  Complications: No complications documented.

## 2020-04-18 NOTE — Transfer of Care (Signed)
Immediate Anesthesia Transfer of Care Note  Patient: Chase OSCAR Sr.  Procedure(s) Performed: LAPAROSCOPIC SIGMOID COLOTOMY WITH COLOSTOMY (N/A Abdomen) HERNIA REPAIR EPIGASTRIC ADULT (N/A Abdomen) CYSTOSCOPY WITH BILATERAL URETERAL STENT CHANGE PLACEMENT (N/A Urethra)  Patient Location: PACU  Anesthesia Type:General  Level of Consciousness: drowsy, patient cooperative and responds to stimulation  Airway & Oxygen Therapy: Patient Spontanous Breathing  Post-op Assessment: Report given to RN, Post -op Vital signs reviewed and stable and Patient moving all extremities  Post vital signs: Reviewed and stable  Last Vitals:  Vitals Value Taken Time  BP 122/78 04/18/20 1641  Temp    Pulse 76 04/18/20 1646  Resp 13 04/18/20 1646  SpO2 99 % 04/18/20 1646  Vitals shown include unvalidated device data.  Last Pain:  Vitals:   04/18/20 1120  TempSrc:   PainSc: 0-No pain      Patients Stated Pain Goal: 4 (67/67/20 9470)  Complications: No complications documented.

## 2020-04-18 NOTE — Anesthesia Procedure Notes (Signed)
Procedure Name: Intubation Date/Time: 04/18/2020 1:35 PM Performed by: Inda Coke, CRNA Pre-anesthesia Checklist: Patient identified, Emergency Drugs available, Suction available and Patient being monitored Patient Re-evaluated:Patient Re-evaluated prior to induction Oxygen Delivery Method: Circle System Utilized Preoxygenation: Pre-oxygenation with 100% oxygen Induction Type: IV induction and Rapid sequence Ventilation: Mask ventilation without difficulty Laryngoscope Size: Mac and 4 Grade View: Grade I Tube type: Oral Tube size: 7.5 mm Number of attempts: 1 Airway Equipment and Method: Stylet and Oral airway Placement Confirmation: ETT inserted through vocal cords under direct vision,  positive ETCO2 and breath sounds checked- equal and bilateral Secured at: 22 cm Tube secured with: Tape Dental Injury: Teeth and Oropharynx as per pre-operative assessment

## 2020-04-18 NOTE — Interval H&P Note (Signed)
History and Physical Interval Note:  04/18/2020 1:08 PM  Chase Saxon Sr.  has presented today for surgery, with the diagnosis of COLOVESICAL FISTULA.  The various methods of treatment have been discussed with the patient and family. After consideration of risks, benefits and other options for treatment, the patient has consented to  Procedure(s): DIVERTING LOOP COLOSTOMY (N/A) FLEXIBLE SIGMOIDOSCOPY (N/A) CYSTOSCOPY WITH URETEROSCOPY AND STENT PLACEMENT (N/A) as a surgical intervention.  The patient's history has been reviewed, patient examined, no change in status, stable for surgery.  I have reviewed the patient's chart and labs.  Questions were answered to the patient's satisfaction.    I have re-reviewed the the patient's records, history, medications, and allergies.  I have re-examined the patient.  I again discussed intraoperative plans and goals of post-operative recovery.  The patient agrees to proceed.  Chase YANDELL Sr.  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    Priority: High   Acute kidney failure ([place]) 08/24/2019    Priority: High   Bilateral ureteral obstruction s/p perc nephrosotmy & stenting 12/30/2019    Priority: Medium   Pelvic abscess with colovescial fistula 11/15/2019    Priority: Medium   Protein-calorie malnutrition, severe 10/23/2019    Priority: Medium   CKD (chronic kidney disease), stage IV ([place]) 10/17/2019     Priority: Medium   History of gastric bypass 08/24/2019    Priority: Medium   History of CVA (cerebrovascular accident) 12/31/2019    Priority: Low   Cerebral embolism with cerebral infarction 10/18/2019    Priority: Low   Acute respiratory failure with hypoxia [facility])    DNR (do not resuscitate) discussion    Palliative care by specialist    Adult failure to thrive    Sepsis with acute renal failure without septic shock ([place]) 03/31/2020   Incisional hernia - epigastric 01/04/2020   Chronic narcotic use 01/02/2020   Anemia in chronic kidney disease 01/02/2020   ATN (acute tubular necrosis) ([place]) 12/31/2019   Candida UTI 12/31/2019   Electrolyte abnormality 12/31/2019   Increased anion gap metabolic acidosis 19/50/9326   Microcytic anemia 12/31/2019   ABLA (acute blood loss anemia) 12/31/2019   Anxiety and depression 12/31/2019   BMI 24.0-24.9, adult 12/31/2019   AKI (acute kidney injury) ([place]) 12/24/2019   E. coli UTI (urinary tract infection) 11/15/2019   PTSD (post-traumatic stress disorder)    Chemical exposure    Cervical radiculopathy    Septic shock ([place]) 11/08/2019   Acute left-sided weakness 10/17/2019   Chronic pain 10/17/2019   Fall 10/17/2019   Pseudomonas urinary tract infection 08/24/2019   BPH (benign prostatic hyperplasia) 08/24/2019   Anemia 08/24/2019   Dyspnea 08/24/2019   History of COVID-19 08/24/2019   History of elevated PSA 08/24/2019   Hyponatremia 71/24/5809   Metabolic acidosis 98/33/8250   Skin lesion of neck 53/97/6734   Toxic metabolic encephalopathy 19/37/9024   Cervical spondylosis without myelopathy 06/19/2016   Neck pain 09/04/2015   History of stomach ulcers 05/19/2013   Male erectile dysfunction 05/19/2013  Hodgkin lymphoma, unspecified, unspecified site ([place]) 12/10/2012   Iron deficiency anemia 12/10/2012   Pernicious anemia 12/10/2012   Arthralgia of multiple sites 04/20/2012   Postlaminectomy syndrome, not elsewhere classified  04/20/2012    Past Medical History:  Diagnosis Date   Cervical radiculopathy    with left sided weakness   Chemical exposure    service related injury   Chronic pain    CKD (chronic kidney disease), stage IV ([place]) 10/17/2019   COVID-19    GERD (gastroesophageal reflux disease)    GIB (gastrointestinal bleeding)    Heart attack ([place])    History of CVA (cerebrovascular accident) 12/31/2019   History of gastric bypass 08/24/2019   History of stomach ulcers 05/19/2013   Formatting of this note might be different from the original. Attributed to chronic ibuprofen use   Hodgkin lymphoma, unspecified, unspecified site ([place]) 12/10/2012   Formatting of this note might be different from the original. In remission.   Hypokalemia    Hypomagnesemia    PTSD (post-traumatic stress disorder)    Renal disorder    CKD   Stroke [facility])    Urinary retention    due to bladder outlet obstruction    Past Surgical History:  Procedure Laterality Date   COLONOSCOPY     CYSTOSCOPY W/ URETERAL STENT PLACEMENT N/A 11/14/2019   Procedure: CYSTOSCOPY WITH RETROGRADE PYELOGRAM bilateral Wyvonnia Dusky STENT PLACEMENT left fulguration bladder . cystogram;  Surgeon: Raynelle Bring, MD;  Location: [facility];  Service: Urology;  Laterality: N/A;   CYSTOSCOPY W/ URETERAL STENT PLACEMENT Bilateral 12/25/2019   Procedure: CYSTOSCOPY WITH bilateral  RETROGRADE PYELOGRAM/ leftURETERAL STENT PLACEMENT;  Surgeon: Lucas Mallow, MD;  Location: [facility];  Service: Urology;  Laterality: Bilateral;   GASTRIC BYPASS     HERNIA REPAIR     IR FLUORO GUIDE CV LINE RIGHT  04/01/2020   IR NEPHROSTOMY PLACEMENT LEFT  12/27/2019   IR NEPHROSTOMY PLACEMENT LEFT  04/01/2020   IR NEPHROSTOMY PLACEMENT RIGHT  11/16/2019   IR NEPHROSTOMY PLACEMENT RIGHT  12/27/2019   IR NEPHROSTOMY PLACEMENT RIGHT  04/01/2020   IR SINUS/FIST TUBE CHK-NON GI  12/03/2019   IR URETERAL STENT PLACEMENT EXISTING ACCESS RIGHT  12/08/2019   IR URETERAL STENT PLACEMENT EXISTING  ACCESS RIGHT  01/26/2020   IR US GUIDE VASC ACCESS RIGHT  04/01/2020    Social History   Socioeconomic History   Marital status: Widowed    Spouse name: Not on file   Number of children: Not on file   Years of education: Not on file   Highest education level: Not on file  Occupational History   Not on file  Tobacco Use   Smoking status: Never Smoker   Smokeless tobacco: Current User  Vaping Use   Vaping Use: Never used  Substance and Sexual Activity   Alcohol use: Never   Drug use: Never   Sexual activity: Not on file  Other Topics Concern   Not on file  Social History Narrative   Widow, Social research officer, government veteran served in [employer] gets care through the [place] as well   2 sons   Lives alone, his mother has been helping him in 2021 with illnesses   Former smoker no alcohol  or drug use now   Does use smokeless tobacco   2 caffeinated beverages daily   Social Determinants of Radio broadcast assistant Strain: Not on file  Food Insecurity: Not on file  Transportation Needs: Not on file  Physical  Activity: Not on file  Stress: Not on file  Social Connections: Not on file  Intimate Partner Violence: Not on file    Family History  Problem Relation Age of Onset   Renal cancer Father        mets to liver and lungs and pancreas   Heart disease Father    Heart disease Paternal Grandfather    Colon cancer Maternal Grandmother    Colon cancer Paternal Uncle    Stomach cancer Neg Hx    Esophageal cancer Neg Hx    Pancreatic cancer Neg Hx     Medications Prior to Admission  Medication Sig Dispense Refill Last Dose   docusate sodium (COLACE) 100 MG capsule Take 100 mg by mouth 2 (two) times daily as needed (constipation).      Cholecalciferol (VITAMIN D3) 20 MCG (800 UNIT) TABS Take 800 Units by mouth daily.      cyanocobalamin (,VITAMIN B-12,) 1000 MCG/ML injection Inject 1,000 mcg into the muscle every 30 (thirty) days.       DULoxetine (CYMBALTA) 30 MG capsule Take 30-60 mg  by mouth See admin instructions. Take 60 mg in the morning and 30 mg in the evening      gabapentin (NEURONTIN) 100 MG capsule Take 2 capsules (200 mg total) by mouth daily. (Patient taking differently: Take 800 mg by mouth 3 (three) times daily. ) 60 capsule 0    hydrOXYzine (ATARAX/VISTARIL) 10 MG tablet Take 1 tablet (10 mg total) by mouth 3 (three) times daily as needed for itching or anxiety. (Patient taking differently: Take 10 mg by mouth 3 (three) times daily as needed for anxiety.) 30 tablet 0    Multiple Vitamin (MULTIVITAMIN WITH MINERALS) TABS tablet Take 1 tablet by mouth daily.      oxyCODONE-acetaminophen (PERCOCET/ROXICET) 5-325 MG tablet Take by mouth every 4 (four) hours as needed for severe pain.      pantoprazole (PROTONIX) 20 MG tablet Take 20 mg by mouth 2 (two) times daily.      peg 3350 powder (MOVIPREP) 100 g SOLR Take 1 kit (200 g total) by mouth as directed. 1 kit 0    prazosin (MINIPRESS) 1 MG capsule Take 1 mg by mouth at bedtime.      sevelamer carbonate (RENVELA) 800 MG tablet Take 800 mg by mouth 3 (three) times daily with meals.      sodium bicarbonate 650 MG tablet Take 650 mg by mouth 2 (two) times daily.      sodium chloride flush 0.9 % SOLN injection Inject 3 mLs into the vein daily.      tamsulosin (FLOMAX) 0.4 MG CAPS capsule Take 0.4 mg by mouth at bedtime.        Current Facility-Administered Medications  Medication Dose Route Frequency Provider Last Rate Last Admin   0.9 %  sodium chloride infusion (Manually program via Guardrails IV Fluids)   Intravenous Once Inda Coke, CRNA       0.9 %  sodium chloride infusion   Intravenous Continuous Belinda Block, MD 10 mL/hr at 04/18/20 1208 New Bag at 04/18/20 1208   [MAR Hold] acetaminophen (TYLENOL) tablet 650 mg  650 mg Oral Q6H PRN Opyd, Ilene Qua, MD   650 mg at 04/12/20 1024   Or   [MAR Hold] acetaminophen (TYLENOL) suppository 650 mg  650 mg Rectal Q6H PRN Opyd, Ilene Qua, MD   650 mg at 04/03/20 0001    bupivacaine liposome (EXPAREL) 1.3 % injection 266 mg  20  mL Infiltration Once Michael Boston, MD       [facility] Hold] calcium carbonate (TUMS - dosed in mg elemental calcium) chewable tablet 200 mg of elemental calcium  1 tablet Oral TID Modena Jansky, MD   200 mg of elemental calcium at 04/17/20 2335   cefoTEtan (CEFOTAN) 2 g in sodium chloride 0.9 % 100 mL IVPB  2 g Intravenous To Rosemarie Beath, MD       [facility] Hold] Chlorhexidine Gluconate Cloth 2 % PADS 6 each  6 each Topical Daily Charlynne Cousins, MD   6 each at 04/18/20 0942   [MAR Hold] DULoxetine (CYMBALTA) DR capsule 60 mg  60 mg Oral Daily Charlynne Cousins, MD   60 mg at 04/17/20 1047   And   [MAR Hold] DULoxetine (CYMBALTA) DR capsule 30 mg  30 mg Oral QHS Charlynne Cousins, MD   30 mg at 04/17/20 2337   feeding supplement (ENSURE PRE-SURGERY) liquid 296 mL  296 mL Oral Once Michael Boston, MD       feeding supplement (ENSURE PRE-SURGERY) liquid 592 mL  592 mL Oral Once Michael Boston, MD       [facility] Hold] heparin injection 5,000 Units  5,000 Units Subcutaneous Q8H Opyd, Ilene Qua, MD   5,000 Units at 04/18/20 0623   [MAR Hold] iohexol (OMNIPAQUE) 300 MG/ML solution 50 mL  50 mL Per Tube Once PRN Aletta Edouard, MD       lactated ringers infusion   Intravenous Continuous Gatha Mayer, MD 50 mL/hr at 04/18/20 1257 Continued from Pre-op at 04/18/20 1257   [MAR Hold] multivitamin with minerals tablet 1 tablet  1 tablet Oral BID Vernell Leep D, MD   1 tablet at 04/17/20 2337   [MAR Hold] ondansetron (ZOFRAN) tablet 4 mg  4 mg Oral Q6H Samella Parr, NP   4 mg at 04/17/20 1820   Or   [MAR Hold] ondansetron (ZOFRAN) injection 4 mg  4 mg Intravenous Q6H Samella Parr, NP   4 mg at 04/18/20 0621   [MAR Hold] oxyCODONE-acetaminophen (PERCOCET/ROXICET) 5-325 MG per tablet 1-2 tablet  1-2 tablet Oral Q4H PRN Cristal Ford, DO   2 tablet at 04/18/20 0840   [MAR Hold] pantoprazole (PROTONIX) EC tablet 40 mg  40 mg  Oral QHS Charlynne Cousins, MD   40 mg at 04/17/20 2335   [MAR Hold] peg 3350 powder (MOVIPREP) kit 100 g  0.5 kit Oral Once Gatha Mayer, MD       [facility] Hold] senna-docusate (Senokot-S) tablet 1 tablet  1 tablet Oral BID Michael Boston, MD   1 tablet at 04/17/20 2336   [facility] Hold] sodium bicarbonate tablet 1,300 mg  1,300 mg Oral TID Gean Quint, MD   1,300 mg at 04/17/20 2333   [facility] Hold] sodium chloride flush (NS) 0.9 % injection 5 mL  5 mL Intracatheter Q8H Aletta Edouard, MD   5 mL at 04/18/20 0557   [MAR Hold] tamsulosin (FLOMAX) capsule 0.4 mg  0.4 mg Oral QHS Charlynne Cousins, MD   0.4 mg at 04/17/20 2335   [MAR Hold] tiZANidine (ZANAFLEX) tablet 2 mg  2 mg Oral BID PRN Cristal Ford, DO   2 mg at 04/18/20 0244   Facility-Administered Medications Ordered in Other Encounters  Medication Dose Route Frequency Provider Last Rate Last Admin   lactated ringers infusion   Intravenous Continuous PRN Janace Litten, CRNA   New Bag at 04/18/20 1250  Allergies  Allergen Reactions   Nsaids Other (See Comments)    Bleeding GI Ulceration   Ciprofloxacin Nausea And Vomiting    BP (!) 96/50   Pulse 69   Temp 97.8 F (36.6 C) (Axillary)   Resp 10   Ht 5' 6" (1.676 m)   Wt 52.4 kg   SpO2 98%   BMI 18.65 kg/m   Labs: Results for orders placed or performed during the hospital encounter of 03/31/20 (from the past 48 hour(s))  Protime-INR     Status: None   Collection Time: 04/16/20  1:54 PM  Result Value Ref Range   Prothrombin Time 13.2 11.4 - 15.2 seconds   INR 1.0 0.8 - 1.2    Comment: (NOTE) INR goal varies based on device and disease states. Performed at [facility], [place] [address] [place], [place] [address]   Prealbumin     Status: None   Collection Time: 04/16/20  1:54 PM  Result Value Ref Range   Prealbumin 20.6 18 - 38 mg/dL    Comment: Performed at [facility] [address], [place], [place] [address]  Glucose, capillary     Status: None    Collection Time: 04/16/20  4:03 PM  Result Value Ref Range   Glucose-Capillary 91 70 - 99 mg/dL    Comment: Glucose reference range applies only to samples taken after fasting for at least 8 hours.   Comment 1 Notify RN    Comment 2 Document in Chart   Glucose, capillary     Status: Abnormal   Collection Time: 04/16/20 10:18 PM  Result Value Ref Range   Glucose-Capillary 130 (H) 70 - 99 mg/dL    Comment: Glucose reference range applies only to samples taken after fasting for at least 8 hours.  Glucose, capillary     Status: None   Collection Time: 04/17/20  2:05 AM  Result Value Ref Range   Glucose-Capillary 88 70 - 99 mg/dL    Comment: Glucose reference range applies only to samples taken after fasting for at least 8 hours.  CBC     Status: Abnormal   Collection Time: 04/17/20  3:07 AM  Result Value Ref Range   WBC 6.8 4.0 - 10.5 K/uL   RBC 3.21 (L) 4.22 - 5.81 MIL/uL   Hemoglobin 9.4 (L) 13.0 - 17.0 g/dL   HCT 31.3 (L) 39.0 - 52.0 %   MCV 97.5 80.0 - 100.0 fL   MCH 29.3 26.0 - 34.0 pg   MCHC 30.0 30.0 - 36.0 g/dL   RDW 15.6 (H) 11.5 - 15.5 %   Platelets 341 150 - 400 K/uL   nRBC 0.0 0.0 - 0.2 %    Comment: Performed at [facility] [address] [place], [place] [address]  Comprehensive metabolic panel     Status: Abnormal   Collection Time: 04/17/20  3:07 AM  Result Value Ref Range   Sodium 137 135 - 145 mmol/L   Potassium 4.6 3.5 - 5.1 mmol/L   Chloride 103 98 - 111 mmol/L   CO2 25 22 - 32 mmol/L   Glucose, Bld 95 70 - 99 mg/dL    Comment: Glucose reference range applies only to samples taken after fasting for at least 8 hours.   BUN 29 (H) 6 - 20 mg/dL   Creatinine, Ser 4.97 (H) 0.61 - 1.24 mg/dL   Calcium 8.3 (L) 8.9 - 10.3 mg/dL   Total Protein 6.4 (L) 6.5 -  8.1 g/dL   Albumin 2.3 (L) 3.5 - 5.0 g/dL   AST 33 15 - 41 U/L   ALT 26 0 - 44 U/L   Alkaline Phosphatase 122 38 - 126 U/L   Total Bilirubin 0.5 0.3 - 1.2 mg/dL   GFR, Estimated 13 (L) >60 mL/min     Comment: (NOTE) Calculated using the CKD-EPI Creatinine Equation (2021)    Anion gap 9 5 - 15    Comment: Performed at [facility] [address] [place], [place] [address]  Glucose, capillary     Status: None   Collection Time: 04/17/20  5:06 AM  Result Value Ref Range   Glucose-Capillary 97 70 - 99 mg/dL    Comment: Glucose reference range applies only to samples taken after fasting for at least 8 hours.  Glucose, capillary     Status: None   Collection Time: 04/17/20  9:25 AM  Result Value Ref Range   Glucose-Capillary 85 70 - 99 mg/dL    Comment: Glucose reference range applies only to samples taken after fasting for at least 8 hours.  Glucose, capillary     Status: None   Collection Time: 04/17/20 12:51 PM  Result Value Ref Range   Glucose-Capillary 86 70 - 99 mg/dL    Comment: Glucose reference range applies only to samples taken after fasting for at least 8 hours.  Glucose, capillary     Status: None   Collection Time: 04/17/20  5:31 PM  Result Value Ref Range   Glucose-Capillary 89 70 - 99 mg/dL    Comment: Glucose reference range applies only to samples taken after fasting for at least 8 hours.  I-STAT, chem 8     Status: Abnormal   Collection Time: 04/18/20 12:05 PM  Result Value Ref Range   Sodium 136 135 - 145 mmol/L   Potassium 4.2 3.5 - 5.1 mmol/L   Chloride 105 98 - 111 mmol/L   BUN 29 (H) 6 - 20 mg/dL   Creatinine, Ser 4.80 (H) 0.61 - 1.24 mg/dL   Glucose, Bld 82 70 - 99 mg/dL    Comment: Glucose reference range applies only to samples taken after fasting for at least 8 hours.   Calcium, Ion 1.11 (L) 1.15 - 1.40 mmol/L   TCO2 23 22 - 32 mmol/L   Hemoglobin 11.2 (L) 13.0 - 17.0 g/dL   HCT 33.0 (L) 39.0 - 52.0 %    Imaging / Studies: IR Fluoro Guide CV Line Right  Result Date: 04/01/2020 CLINICAL DATA:  Renal failure and need for emergent hemodialysis. EXAM: NON-TUNNELED CENTRAL VENOUS HEMODIALYSIS CATHETER PLACEMENT WITH ULTRASOUND AND  FLUOROSCOPIC GUIDANCE FLUOROSCOPY TIME:  30 seconds. PROCEDURE: The procedure, risks, benefits, and alternatives were explained to the patient's son. Questions regarding the procedure were encouraged and answered. The patient's son understands and consents to the procedure. A time-out was performed prior to initiating the procedure. Ultrasound was used to confirm patency of the right internal jugular vein. The right neck and chest were prepped with chlorhexidine in a sterile fashion, and a sterile drape was applied covering the operative field. Maximum barrier sterile technique with sterile gowns and gloves were used for the procedure. Local anesthesia was provided with 1% lidocaine. After creating a small venotomy incision, a 21 gauge needle was advanced into the right internal jugular vein under direct, real-time ultrasound guidance. Ultrasound image documentation was performed. After securing guidewire access venous access was dilated over a guidewire. A 16 cm, 13 French triple-lumen  Mahurkar non tunneled hemodialysis catheter was advanced over the wire. Final catheter positioning was confirmed and documented with a fluoroscopic spot image. The catheter was aspirated, flushed with saline, and injected with appropriate volume heparin dwells. The catheter exit site was secured with 0-Prolene retention sutures. COMPLICATIONS: None.  No pneumothorax. FINDINGS: After catheter placement, the tip lies at the cavoatrial junction. The catheter aspirates normally and is ready for immediate use. IMPRESSION: Placement of non-tunneled central venous hemodialysis catheter via the right internal jugular vein. The catheter tip lies at the cavoatrial junction. The catheter is ready for immediate use. Electronically Signed   By: Aletta Edouard M.D.   On: 04/01/2020 14:26   IR US Guide Vasc Access Right  Result Date: 04/01/2020 CLINICAL DATA:  Renal failure and need for emergent hemodialysis. EXAM: NON-TUNNELED CENTRAL VENOUS  HEMODIALYSIS CATHETER PLACEMENT WITH ULTRASOUND AND FLUOROSCOPIC GUIDANCE FLUOROSCOPY TIME:  30 seconds. PROCEDURE: The procedure, risks, benefits, and alternatives were explained to the patient's son. Questions regarding the procedure were encouraged and answered. The patient's son understands and consents to the procedure. A time-out was performed prior to initiating the procedure. Ultrasound was used to confirm patency of the right internal jugular vein. The right neck and chest were prepped with chlorhexidine in a sterile fashion, and a sterile drape was applied covering the operative field. Maximum barrier sterile technique with sterile gowns and gloves were used for the procedure. Local anesthesia was provided with 1% lidocaine. After creating a small venotomy incision, a 21 gauge needle was advanced into the right internal jugular vein under direct, real-time ultrasound guidance. Ultrasound image documentation was performed. After securing guidewire access venous access was dilated over a guidewire. A 16 cm, 13 French triple-lumen Mahurkar non tunneled hemodialysis catheter was advanced over the wire. Final catheter positioning was confirmed and documented with a fluoroscopic spot image. The catheter was aspirated, flushed with saline, and injected with appropriate volume heparin dwells. The catheter exit site was secured with 0-Prolene retention sutures. COMPLICATIONS: None.  No pneumothorax. FINDINGS: After catheter placement, the tip lies at the cavoatrial junction. The catheter aspirates normally and is ready for immediate use. IMPRESSION: Placement of non-tunneled central venous hemodialysis catheter via the right internal jugular vein. The catheter tip lies at the cavoatrial junction. The catheter is ready for immediate use. Electronically Signed   By: Aletta Edouard M.D.   On: 04/01/2020 14:26   DG CHEST PORT 1 VIEW  Result Date: 04/02/2020 CLINICAL DATA:  Evaluate for pulmonary pathology. EXAM:  PORTABLE CHEST 1 VIEW COMPARISON:  April 01, 2020 11:40 p.m. FINDINGS: The mediastinal contour and cardiac silhouette are stable. Right central venous line is identified with distal tip in the superior vena cava/right atrium. Consolidation of medial right lung base is identified. The left lung is clear. The bony structures are stable. IMPRESSION: Right lung base pneumonia. Electronically Signed   By: Abelardo Diesel M.D.   On: 04/02/2020 08:21   DG Chest Port 1 View  Result Date: 04/01/2020 CLINICAL DATA:  Acute respiratory distress EXAM: PORTABLE CHEST 1 VIEW COMPARISON:  03/31/2020 FINDINGS: Elevation of the right hemidiaphragm again noted with associated right basilar atelectasis. Left lung clear. No pneumothorax or pleural effusion. Right internal jugular central venous catheter tip noted at the superior cavoatrial junction. Cardiac size within normal limits. Pulmonary vascularity is normal. No acute bone abnormality. IMPRESSION: No active disease. Electronically Signed   By: Fidela Salisbury MD   On: 04/01/2020 23:48   DG Chest Port 1 [address]  Result Date: 03/31/2020 CLINICAL DATA:  Altered mental status, questionable sepsis, pending COVID test. EXAM: PORTABLE CHEST 1 VIEW COMPARISON:  Chest x-ray dated 11/08/2019. FINDINGS: Heart size and mediastinal contours are within normal limits. Lungs appear clear. No pleural effusion or pneumothorax is seen. Osseous structures about the chest are unremarkable. IMPRESSION: No active disease. No evidence of pneumonia. Electronically Signed   By: Franki Cabot M.D.   On: 03/31/2020 14:36   CT Renal Stone Study  Result Date: 03/31/2020 CLINICAL DATA:  Flank pain EXAM: CT ABDOMEN AND PELVIS WITHOUT CONTRAST TECHNIQUE: Multidetector CT imaging of the abdomen and pelvis was performed following the standard protocol without IV contrast. COMPARISON:  12/30/2019, 01/26/2020, 11/14/2019, 12/24/2019 FINDINGS: Lower chest: Lung bases demonstrate no acute consolidation or  pleural effusion. Normal cardiac size. Hepatobiliary: Status post cholecystectomy. No focal hepatic abnormality. No biliary dilatation. Pancreas: Unremarkable. No pancreatic ductal dilatation or surrounding inflammatory changes. Spleen: Normal in size without focal abnormality. Adrenals/Urinary Tract: Adrenal glands within normal limits. Bilateral percutaneous nephrostomy catheters has been removed. Linear soft tissue tract is visible posteriorly. Bilateral ureteral stents are visualized, proximal pigtail on the right is within extrarenal pelvis, distal pigtail within the posterior bladder. Proximal pigtail of left ureteral stent is visible in the proximal left ureter, the distal portion of the stent is in the urinary bladder. Moderate bilateral hydronephrosis which is increased compared to the previous CT. Multiple small foci of gas within the bilateral renal collecting systems which may be due to refluxed air from the bladder versus recent removal of percutaneous nephrostomy tubes. Moderate perinephric fat stranding. Foley catheter within the bladder. Small air within the urinary bladder which appears slightly thick-walled. Stomach/Bowel: The stomach is nonenlarged. Evidence of prior gastric bypass. No dilated small bowel. Large amount of stool in the colon. Negative appendix. No acute bowel wall thickening. Vascular/Lymphatic: Mild aortic atherosclerosis without aneurysm. No suspicious nodes. Reproductive: Slightly enlarged prostate Other: No free air or free fluid. Left trans gluteal percutaneous drainage catheter with pigtail visible in the left posterior pelvis. No significant fluid collections visible within the pelvis at this time. Musculoskeletal: No acute or suspicious osseous abnormality. IMPRESSION: 1. Bilateral percutaneous nephrostomy catheters have been removed. Interim finding of moderate bilateral hydronephrosis of extrarenal pelvises, increased compared to prior CT. Right ureteral stent in expected  position. Proximal pigtail of the left ureteral stent is visible within the proximal left ureter. Foley catheter present within decompressed urinary bladder; bladder wall appears slightly thickened. 2. Left trans gluteal percutaneous drainage catheter with pigtail visible in the left posterior pelvis. No significant fluid collections visible within the pelvis at this time. 3. Large amount of stool in the colon, suggesting constipation. Aortic Atherosclerosis (ICD10-I70.0). Electronically Signed   By: Donavan Foil M.D.   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 Status post prior left ureteral stent placement in September and right ureteral stent placement in October. Admitted with severe acute kidney injury superimposed on chronic kidney disease, lack of urine output and evidence of bilateral hydronephrosis by imaging. An indwelling right nephrostomy tube has been pulled out for unknown length of time. He now presents for new bilateral percutaneous nephrostomy tube placement for renal decompression to determine degree of recovery will renal function. EXAM: 1. LEFT PERCUTANEOUS NEPHROSTOMY TUBE PLACEMENT 2. RIGHT PERCUTANEOUS NEPHROSTOMY TUBE PLACEMENT COMPARISON:  CT of the abdomen  and pelvis on 03/31/2020 ANESTHESIA/SEDATION: Moderate (conscious) sedation was employed during this procedure. A total of Versed 1.0 mg and Fentanyl 50 mcg was administered intravenously. Moderate Sedation Time: 27 minutes. The patient's level of consciousness and vital signs were monitored continuously by radiology nursing throughout the procedure under my direct supervision. CONTRAST:  15 mL Omnipaque 300 MEDICATIONS: 1 g IV Rocephin. Antibiotic was administered in an appropriate time frame prior to skin puncture.  FLUOROSCOPY TIME:  1 minute and 54 seconds.  8.0 mGy. PROCEDURE: The procedure, risks, benefits, and alternatives were explained to the patient's son by telephone. Questions regarding the procedure were encouraged and answered. The patient's son understands and consents to the procedure. A time-out was performed prior to initiating the procedure. Bilateral flank regions were prepped with chlorhexidine in a sterile fashion, and a sterile drape was applied covering the operative field. A sterile gown and sterile gloves were used for the procedure. Local anesthesia was provided with 1% Lidocaine. Ultrasound was used to localize the left kidney. Under direct ultrasound guidance, a 21 gauge needle was advanced into the renal collecting system. Ultrasound image documentation was performed. Aspiration of urine sample was performed followed by contrast injection under fluoroscopy. A transitional dilator was advanced over a guidewire. Percutaneous tract dilatation was then performed over the guidewire. A 10-French percutaneous nephrostomy tube was then advanced and formed in the left renal collecting system. Additional urine sample was aspirated and sent for culture analysis. Catheter position was confirmed by fluoroscopy after contrast injection. Ultrasound was then used to localize the right kidney. A 21 gauge needle was advanced under ultrasound guidance into the right renal collecting system and ultrasound image documentation performed. After aspiration of urine, contrast was injected under fluoroscopy. A transitional dilator was advanced over a guidewire under fluoroscopy. The tract was dilated and a 10 French percutaneous nephrostomy tube advanced and formed in the right renal collecting system. Catheter position was confirmed by fluoroscopy after contrast injection. Both nephrostomy tubes were secured at the skin with Prolene retention sutures and Stat-Lock devices. Both were connected to gravity drainage bags.  COMPLICATIONS: None. FINDINGS: Ultrasound confirms the presence of significant hydronephrosis bilaterally. After access of the left renal collecting system and placement of a nephrostomy tube formed in the renal pelvis, there is return of grossly purulent appearing urine. A sample of purulent fluid was sent for culture analysis. After placement of the right nephrostomy tube which was also formed in the renal pelvis, there is return of some blood tinged urine. IMPRESSION: Placement of bilateral 10 French percutaneous nephrostomy tubes formed at the level of the renal pelvis. Urine from the left kidney was grossly purulent and a sample sent for culture analysis. Both nephrostomy tubes were connected to gravity drainage bags. Electronically Signed   By: Aletta Edouard M.D.   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 Status post prior left ureteral stent placement in September and right ureteral stent placement in October. Admitted with severe acute kidney injury superimposed on chronic kidney disease, lack of urine output and evidence of bilateral hydronephrosis by imaging. An indwelling right nephrostomy tube has been pulled out for unknown length of time. He now presents for new bilateral percutaneous nephrostomy tube placement for renal decompression to determine degree of recovery will renal function. EXAM: 1. LEFT PERCUTANEOUS NEPHROSTOMY TUBE PLACEMENT 2. RIGHT PERCUTANEOUS  NEPHROSTOMY TUBE PLACEMENT COMPARISON:  CT of the abdomen and pelvis on 03/31/2020 ANESTHESIA/SEDATION: Moderate (conscious) sedation was employed during this procedure. A total of Versed 1.0 mg and Fentanyl 50 mcg was administered intravenously. Moderate Sedation Time: 27 minutes. The patient's level of consciousness and vital  signs were monitored continuously by radiology nursing throughout the procedure under my direct supervision. CONTRAST:  15 mL Omnipaque 300 MEDICATIONS: 1 g IV Rocephin. Antibiotic was administered in an appropriate time frame prior to skin puncture. FLUOROSCOPY TIME:  1 minute and 54 seconds.  8.0 mGy. PROCEDURE: The procedure, risks, benefits, and alternatives were explained to the patient's son by telephone. Questions regarding the procedure were encouraged and answered. The patient's son understands and consents to the procedure. A time-out was performed prior to initiating the procedure. Bilateral flank regions were prepped with chlorhexidine in a sterile fashion, and a sterile drape was applied covering the operative field. A sterile gown and sterile gloves were used for the procedure. Local anesthesia was provided with 1% Lidocaine. Ultrasound was used to localize the left kidney. Under direct ultrasound guidance, a 21 gauge needle was advanced into the renal collecting system. Ultrasound image documentation was performed. Aspiration of urine sample was performed followed by contrast injection under fluoroscopy. A transitional dilator was advanced over a guidewire. Percutaneous tract dilatation was then performed over the guidewire. A 10-French percutaneous nephrostomy tube was then advanced and formed in the left renal collecting system. Additional urine sample was aspirated and sent for culture analysis. Catheter position was confirmed by fluoroscopy after contrast injection. Ultrasound was then used to localize the right kidney. A 21 gauge needle was advanced under ultrasound guidance into the right renal collecting system and ultrasound image documentation performed. After aspiration of urine, contrast was injected under fluoroscopy. A transitional dilator was advanced over a guidewire under fluoroscopy. The tract was dilated and a 10 French percutaneous nephrostomy tube advanced and formed in the right  renal collecting system. Catheter position was confirmed by fluoroscopy after contrast injection. Both nephrostomy tubes were secured at the skin with Prolene retention sutures and Stat-Lock devices. Both were connected to gravity drainage bags. COMPLICATIONS: None. FINDINGS: Ultrasound confirms the presence of significant hydronephrosis bilaterally. After access of the left renal collecting system and placement of a nephrostomy tube formed in the renal pelvis, there is return of grossly purulent appearing urine. A sample of purulent fluid was sent for culture analysis. After placement of the right nephrostomy tube which was also formed in the renal pelvis, there is return of some blood tinged urine. IMPRESSION: Placement of bilateral 10 French percutaneous nephrostomy tubes formed at the level of the renal pelvis. Urine from the left kidney was grossly purulent and a sample sent for culture analysis. Both nephrostomy tubes were connected to gravity drainage bags. Electronically Signed   By: Aletta Edouard M.D.   On: 04/01/2020 14:20     .Adin Hector, M.D., F.A.C.S. Gastrointestinal and Minimally Invasive Surgery [facility] [address] [place], [place] [address] [phone] Main / Paging  04/18/2020 1:09 PM    Adin Hector

## 2020-04-18 NOTE — OR Nursing (Signed)
Pt is awake,alert and oriented.Pt and/or family verbalized understanding of poc and discharge instructions. Reviewed admission and on going care with receiving RN. Pt is in NAD at this time and is ready to be transferred to floor. Will con't to monitor until pt is transferred. Belongings on bed with patient Pt on 02 * Pt on Monitor [facility] rn notified to take belongings and give report to new nurse on [employer]

## 2020-04-18 NOTE — Progress Notes (Signed)
5 Days Post-Op Subjective: Chase [name] has no new complaints.  Surgery was cancelled last week at patient's request.  Now back on schedule today per [name] for diverting colostomy.  Objective: Vital signs in last 24 hours: Temp:  [98 F (36.7 C)-98.1 F (36.7 C)] 98 F (36.7 C) (01/18 0610) Pulse Rate:  [75] 75 (01/18 0610) Resp:  [12-18] 18 (01/18 0610) BP: (97-118)/(58-72) 97/58 (01/18 0610) SpO2:  [98 %-100 %] 100 % (01/18 0610) Weight:  [52.4 kg] 52.4 kg (01/18 0610)  Intake/Output from previous day: 01/17 0701 - 01/18 0700 In: 854.6 [P.O.:600; I.V.:24.6] Out: 755 [Urine:750; Drains:5] Intake/Output this shift: No intake/output data recorded.  Physical Exam:  General: Alert and oriented GU: Urine grossly clear from nephrostomy tubes  Lab Results: Recent Labs    04/17/20 0307  HGB 9.4*  HCT 31.3*   BMET Recent Labs    04/17/20 0307  NA 137  K 4.6  CL 103  CO2 25  GLUCOSE 95  BUN 29*  CREATININE 4.97*  CALCIUM 8.3*     Studies/Results: No results found.  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  General surgery therefore plans bowel diversion today with plans for future definitive surgical repair of fistula once his performance status is improved.  2) Bilateral ureteral obstruction/CKD: He is optimally drained with bilateral ureteral stents and bilateral nephrostomy tubes.  Considering need for indwelling stents for his definitive surgery in future, will continue with stent management with plans to perform cystoscopy with ureteral stent change during his anesthetic today.  He understands that I will perform this if available or it might be my partner, Dr. Louis Meckel.  He gives informed consent to proceed with this procedure with either of [place] to perform the procedure. 3) Bladder outlet obstruction: Continue Foley catheter with changes every 4-6 weeks.  Will change at time of cystoscopy today.   LOS: 18 days    [name] 04/18/2020, 9:15 AM

## 2020-04-18 NOTE — Anesthesia Postprocedure Evaluation (Signed)
Anesthesia Post Note  Patient: Chase HUCKABA Sr.  Procedure(s) Performed: COLONOSCOPY WITH PROPOFOL (N/A )     Patient location during evaluation: Endoscopy Anesthesia Type: MAC Level of consciousness: awake Pain management: pain level controlled Respiratory status: spontaneous breathing Cardiovascular status: stable Postop Assessment: no apparent nausea or vomiting Anesthetic complications: no   No complications documented.  Last Vitals:  Vitals:   04/18/20 1120 04/18/20 1130  BP: (!) 88/50 (!) 96/50  Pulse: 72 69  Resp: 10 10  Temp:    SpO2: 99% 98%    Last Pain:  Vitals:   04/18/20 1120  TempSrc:   PainSc: 0-No pain                 Rubena Roseman

## 2020-04-18 NOTE — Anesthesia Procedure Notes (Signed)
Procedure Name: MAC Date/Time: 04/18/2020 10:47 AM Performed by: Lowella Dell, CRNA Pre-anesthesia Checklist: Patient identified, Emergency Drugs available, Suction available, Patient being monitored and Timeout performed Patient Re-evaluated:Patient Re-evaluated prior to induction Oxygen Delivery Method: Simple face mask Placement Confirmation: positive ETCO2 Dental Injury: Teeth and Oropharynx as per pre-operative assessment

## 2020-04-18 NOTE — Op Note (Addendum)
Preoperative diagnosis:  Bilateral ureteral obstruction   Postoperative diagnosis:  Bilateral ureteral obstruction   Procedure:  Cystoscopy Bilateral ureteral stent placement (RIGHT 6Fr x 26cm, LEFT 6Fr x 28cm) Bilateral retrograde pyelogram  Surgeon: Louis Meckel, MD Resident Assist: Carmie Kanner, MD  Anesthesia: General  Complications: None  Intraoperative findings:  No encrustation to bilateral stents in bladder LEFT - prior stent positioned in the proximal ureter. Severe tortuosity of the proximal ureter, eventually able to bypass complete loop of ureter into the renal pelvis, stent in position in renal pelvis     RIGHT - prior stent in the renal pelvis, appears that nephrostomy tube possibly poorly positioned against calyceal wall. Tortuous proximal ureter with narrowing at the UPJ. Able to place stent in renal pelvis     EBL: Minimal  Specimens: None  Indication: Chase [name]. is a 55 y.o. patient with bilateral ureteral obstruction. After reviewing the management options for treatment, he elected to proceed with the above surgical procedure(s). We have discussed the potential benefits and risks of the procedure, side effects of the proposed treatment, the likelihood of the patient achieving the goals of the procedure, and any potential problems that might occur during the procedure or recuperation. Informed consent has been obtained.  Description of procedure:  The patient was taken to the operating room and general anesthesia was induced. General surgery performed diverting colostomy. At the close of their case, the patient was placed in the dorsal lithotomy position, prepped and draped in the usual sterile fashion, and preoperative antibiotics were administered. A preoperative time-out was performed.   Cystourethroscopy was performed.  The patient's urethra was examined and was unremarkable. The bladder was then systematically examined in its entirety. There  was no evidence for any bladder tumors, stones, or other mucosal pathology.    Attention then turned to the left ureteral orifice and a sensor wire was passed along the wire. Then the patient's indwelling ureteral stent was identified and brought out to the urethral meatus with the flexible graspers.  A retrograde pyelogram was then performed which revealed the above findings. We were unable to advance the sensor wire beyond the proximal ureter, so switched to an angled glide. We were eventually able to advance this to the renal pelvis, and advanced a 5 fr open ended catheter over this wire. We then switched to our 0.38 sensor guidewire. The wire was then backloaded through the cystoscope and a 6Fr x 28cm ureteral stent was advance over the wire using Seldinger technique.  The stent was positioned appropriately under fluoroscopic and cystoscopic guidance.  The wire was then removed with an adequate stent curl noted in the renal pelvis as well as in the bladder.  We then turned our attention to the right side. Using our flexible grasper, the existing stent was brought to the urethral meatus and cannulated with a sensor wire. A retrograde pyelogram was performed, which again revealed proximal tortuosity at the UPJ. We were unable to advance the sensor wire to the renal pelvis, so switched to the angled glidewire. This was able to advance. We exchanged this for a sensor wire via our 5 fr open ended catheter. The wire was then backloaded through the cystoscope and a 6Fr x 26cm ureteral stent was advance over the wire using Seldinger technique.  The stent was positioned appropriately under fluoroscopic and cystoscopic guidance.  The wire was then removed with an adequate stent curl noted in the renal pelvis as well as in the bladder.  A 16Fr foley catheter was placed with 10cc of sterile water in the balloon and the procedure ended.  The patient appeared to tolerate the procedure well and without complications.   The patient was able to be awakened and transferred to the recovery unit in satisfactory condition.

## 2020-04-18 NOTE — Interval H&P Note (Signed)
History and Physical Interval Note:  04/18/2020 10:34 AM  Chase Saxon Sr.  has presented today for surgery, with the diagnosis of colovesical fistula.  The various methods of treatment have been discussed with the patient and family. After consideration of risks, benefits and other options for treatment, the patient has consented to  Procedure(s): COLONOSCOPY WITH PROPOFOL (N/A) as a surgical intervention.  The patient's history has been reviewed, patient examined, no change in status, stable for surgery.  I have reviewed the patient's chart and labs.  Questions were answered to the patient's satisfaction.    Patient did not take oral prep for colonoscopy.  Ultimately agreed to tap water enemas in prep for sigmoidoscopy.  Case discussed with [name] earlier today and results will also be conveyed to him.  Nelida Meuse III

## 2020-04-18 NOTE — H&P (View-Only) (Signed)
Pt refused bowel rep  D/w Dr Loletha Carrow Will try & see if pt will do some enemas to do a flex sig & evaluate the sigmoid lumen & r/o malignancy  Adin Hector, MD, FACS, MASCRS Gastrointestinal and Minimally Invasive Surgery  [facility] [address] [place], [place] [address] [phone] Fax [phone] Main/Paging  CONTACT INFORMATION: Weekday (9AM-5PM) concerns: Call [facility] main office at [phone] Weeknight (5PM-9AM) or Weekend/Holiday concerns: Check [email or web address] for General Surgery [facility] coverage (Please, do not use SecureChat as it is not reliable communication to operating surgeons for immediate patient care)

## 2020-04-18 NOTE — Discharge Instructions (Signed)
Ostomy Support Information ° °You’ve heard that people get along just fine with only one of their eyes, or one of their lungs, or one of their kidneys. But you also know that you have only one intestine and only one bladder, and that leaves you feeling awfully empty, both physically and emotionally: You think no other people go around without part of their intestine with the ends of their intestines sticking out through their abdominal walls.  ° °YOU ARE NOT ALONE.  There are nearly three quarters of a million people in the [place] who have an ostomy; people who have had surgery to remove all or part of their colons or bladders.  ° °There is even a national association, the [employer] with over 350 local affiliated support groups that are organized by volunteers who provide peer support and counseling. [employer] has a toll free telephone num-ber, 800-826-0826 and an educational, interactive website, www.ostomy.org  ° °An ostomy is an opening in the belly (abdominal wall) made by surgery. Ostomates are people who have had this procedure. The opening (stoma) allows the kidney or bowel to discharge waste. An external pouch covers the stoma to collect waste. Pouches are are a simple bag and are odor free. Different companies have disposable or reusable pouches to fit one's lifestyle. An ostomy can either be temporary or permanent.  ° °THERE ARE THREE MAIN TYPES OF OSTOMIES °· Colostomy. A colostomy is a surgically created opening in the large intestine (colon). °· Ileostomy. An ileostomy is a surgically created opening in the small intestine. °· Urostomy. A urostomy is a surgically created opening to divert urine away from the bladder. ° °OSTOMY Care ° °The following guidelines will make care of your colostomy easier. Keep this information close by for quick reference. ° °Helpful DIET hints °Eat a well-balanced diet including vegetables and fresh fruits. Eat on a regular schedule.  °Drink at least 6 to  8 glasses of fluids daily. °Eat slowly in a relaxed atmosphere. Chew your food thoroughly. Avoid chewing gum, smoking, and drinking from a straw. This will help decrease the amount of air you swallow, which may help reduce gas. °Eating yogurt or drinking buttermilk may help reduce gas. ° °To control gas at night, do not eat after 8 p.m. This will give your bowel time to quiet down before you go to bed. ° °If gas is a problem, you can purchase Beano. Sprinkle Beano on the first bite of food before eating to reduce gas. It has no flavor and should not change the taste of your food. You can buy Beano over the counter at your local drugstore. ° °Foods like fish, onions, garlic, broccoli, asparagus, and cabbage produce odor. Although your pouch is odor-proof, if you eat these foods you may notice a stronger odor when emptying your pouch. If this is a concern, you may want to limit these foods in your diet. ° °If you have an ileostomy, you will have chronic diarrhea & need to drink more liquids to avoid getting dehydrated.  Consider antidiarrheal medicine like imodium (loperamide) or Lomotil to help slow down bowel movements / diarrhea into your ileostomy bag. ° °GETTING TO GOOD BOWEL HEALTH WITH AN ILEOSTOMY ° °  °With the colon bypassed & not in use, you will have small bowel diarrhea.   °It is important to thicken & slow your bowel movements down.   °The goal: 4-6 small BOWEL MOVEMENTS A DAY °It is important to drink plenty of liquids to   avoid getting dehydrated ° °CONTROLLING ILEOSTOMY DIARRHEA ° °o TAKE A FIBER SUPPLEMENT (FiberCon or Benefiner soluble fiber) twice a day - to thicken stools by absorbing excess fluid and retrain the intestines to act more normally.  Slowly increase the dose over a few weeks.  Too much fiber too soon can backfire and cause cramping & bloating. °o  °o TAKE AN IRON SUPPLEMENT twice a day to naturally constipate your bowels.  Usually ferrous sulfate 325mg twice a day) ° °o TAKE  ANTI-DIARRHEAL MEDICINES: °- Loperamide (Imodium) can slow down diarrhea.  Start with two tablets (= 4mg) first and then try one tablet every 6 hours.  Can go up to 2 pills four times day (8 pills of 2mg max) Avoid if you are having fevers or severe pain.  If you are not better or start feeling worse, stop all medicines and call your doctor for advice °- LoMotil (Diphenoxylate / Atropine) is another medicine that can constipate & slow down bowel moevements °- Pepto Bismol (bismuth) can gently thicken bowels as well ° °o If diarrhea is worse,: °- drink plenty of liquids and try simpler foods for a few days to avoid stressing your intestines further. °- Avoid dairy products (especially milk & ice cream) for a short time.  The intestines often can lose the ability to digest lactose when stressed. °- Avoid foods that cause gassiness or bloating.  Typical foods include beans and other legumes, cabbage, broccoli, and dairy foods.  Every person has some sensitivity to other foods, so listen to our body and avoid those foods that trigger problems for you.Call your doctor if you are getting worse or not better.  Sometimes further testing (cultures, endoscopy, X-ray studies, bloodwork, etc) may be needed to help diagnose and treat the cause of the diarrhea. °- Take extra anti-diarrheal medicines (maximum is 8 pills of 2mg loperamide a day) ° ° °Tips for POUCHING an OSTOMY ° ° °Changing Your Pouch °The best time to change your pouch is in the morning, before eating or drinking anything. Your stoma can function at any time, but it will function more after eating or drinking. ° ° °Applying the pouching system ° °Place all your equipment close at hand before removing your pouch. ° °Wash your hands. ° °Stand or sit in front of a mirror. Use the position that works best for you. Remember that you must keep the skin around the stoma wrinkle-free for a good seal. ° °Gently remove the used pouch (1-piece system) or the pouch and old  wafer (2-piece system). Empty the pouch into the toilet. Save the closure clip to use again. ° °Wash the stoma itself and the skin around the stoma. Your stoma may bleed a little when being washed. This is normal. Rinse and pat dry. You may use a wash cloth or soft paper towels (like Bounty), mild soap (like Dial, Safeguard, or Ivory), and water. Avoid soaps that contain perfumes or lotions. ° °For a new pouch (1-piece system) or a new wafer (2-piece system), measure your stoma using the stoma guide in each box of supplies. ° °Trace the shape of your stoma onto the back of the new pouch or the back of the new wafer. Cut out the opening. Remove the paper backing and set it aside. ° °Optional: Apply a skin barrier powder to surrounding skin if it is irritated (bare or weeping), and dust off the excess. °Optional: Apply a skin-prep wipe (such as Skin Prep or All-Kare) to the skin around   the stoma, and let it dry. Do not apply this solution if the skin is irritated (red, tender, or broken) or if you have shaved around the stoma. °Optional: Apply a skin barrier paste (such as Stomahesive, Coloplast, or Premium) around the opening cut in the back of the pouch or wafer. Allow it to dry for 30 to 60 seconds. ° °Hold the pouch (1-piece system) or wafer (2-piece system) with the sticky side toward your body. Make sure the skin around the stoma is wrinkle-free. Center the opening on the stoma, then press firmly to your abdomen (Fig. 4). Look in the mirror to check if you are placing the pouch, or wafer, in the right position. For a 2-piece system, snap the pouch onto the wafer. Make sure it snaps into place securely. ° °Place your hand over the stoma and the pouch or wafer for about 30 seconds. The heat from your hand can help the pouch or wafer stick to your skin. ° °Add deodorant (such as Super Banish or Nullo) to your pouch. Other options include food extracts such as vanilla oil and peppermint extract. Add about 10 drops  of the deodorant to the pouch. Then apply the closure clamp. Note: Do not use toxic ° chemicals or commercial cleaning agents in your pouch. These substances may harm the stoma. ° °Optional: For extra seal, apply tape to all 4 sides around the pouch or wafer, as if you were framing a picture. You may use any brand of medical adhesive tape. °Change your pouch every 5 to 7 days. Change it immediately if a leak occurs.  Wash your hands afterwards. ° °If you are wearing a 2-piece system, you may use 2 new pouches per week and alternate them. Rinse the pouch with mild soap and warm water and hang it to dry for the next day. Apply the fresh pouch. Alternate the 2 pouches like this for a week. After a week, change the wafer and begin with 2 new pouches. Place the old pouches in a plastic bag, and put them in the trash. ° ° °LIVING WITH AN OSTOMY ° °Emptying Your Pouch °Empty your pouch when it is one-third full (of urine, stool, and/or gas). If you wait until your pouch is fuller than this, it will be more difficult to empty and more noticeable. °When you empty your pouch, either put toilet paper in the toilet bowl first, or flush the toilet while you empty the pouch. This will reduce splashing. You can empty the pouch between your legs or to one side while sitting, or while standing or stooping. If you have a 2-piece system, you can snap off the pouch to empty it. Remember that your stoma may function during this time. °If you wish to rinse your pouch after you empty it, a turkey baster can be helpful. When using a baster, squirt water up into the pouch through the opening at the °bottom. With a 2-piece system, you can snap off the pouch to rinse it. After rinsing  your pouch, empty it into the toilet. °When rinsing your pouch at home, put a few granules of Dreft soap in the rinse water. This helps lubricate and freshen your pouch. °The inside of your pouch can be sprayed with non-stick cooking oil (Pam spray). This may  help reduce stool sticking to the inside of the pouch. ° °Bathing °You may shower or bathe with your pouch on or off. Remember that your stoma may function during this time. ° °The materials   you use to wash your stoma and the skin around it should be clean, but they do not need to be sterile.  Wearing Your Pouch During hot weather, or if you perspire a lot in general, wear a cover over your pouch. This may prevent a rash on your skin under the pouch. Pouch covers are sold at ostomy supply stores. Wear the pouch inside your underwear for better support. Watch your weight. Any gain or loss of 10 to 15 pounds or more can change the way your pouch fits.  Going Away From Home A collapsible cup (like those that come in travel kits) or a soft plastic squirt bottle with a pull-up top (like a travel bottle for shampoo) can be used for rinsing your pouch when you are away from home. Tilt the opening of the pouch at an upward angle when using a cup to rinse.  Carry wet wipes or extra tissues to use in public bathrooms.  Carry an extra pouching system with you at all times.  Never keep ostomy supplies in the glove compartment of your car. Extreme heat or cold can damage the skin barriers and adhesive wafers on the pouch.  When you travel, carry your ostomy supplies with you at all times. Keep them within easy reach. Do not pack ostomy supplies in baggage that will be checked or otherwise separated from you, because your baggage might be lost. If youre traveling out of the country, it is helpful to have a letter stating that you are carrying ostomy supplies as a medical necessity.  If you need ostomy supplies while traveling, look in the yellow pages of the telephone book under Surgical Supplies. Or call the local ostomy organization to find out where supplies are available.  Do not let your ostomy supplies get low. Always order new pouches before you use the last one.  Reducing Odor Limit foods such as  broccoli, cabbage, onions, fish, and garlic in your diet to help reduce odor. Each time you empty your pouch, carefully clean the opening of the pouch, both inside and outside, with toilet paper. Rinse your pouch 1 or 2 times daily after you empty it (see directions for emptying your pouch and going away from home). Add deodorant (such as Super Banish or Nullo) to your pouch. Use air deodorizers in your bathroom. Do not add aspirin to your pouch. Even though aspirin can help prevent odor, it could cause ulcers on your stoma.  When to call the doctor Call the doctor if you have any of the following symptoms: Purple, black, or white stoma Severe cramps lasting more than 6 hours Severe watery discharge from the stoma lasting more than 6 hours No output from the colostomy for 3 days Excessive bleeding from your stoma Swelling of your stoma to more than 1/2-inch larger than usual Pulling inward of your stoma below skin level Severe skin irritation or deep ulcers Bulging or other changes in your abdomen  When to call your ostomy nurse Call your ostomy/enterostomal therapy (WOCN) nurse if any of the following occurs: Frequent leaking of your pouching system Change in size or appearance of your stoma, causing discomfort or problems with your pouch Skin rash or rawness Weight gain or loss that causes problems with your pouch     FREQUENTLY ASKED QUESTIONS   Why havent you met any of these folks who have an ostomy?  Well, maybe you have! You just did not recognize them because an ostomy doesn't show. It can be  kept secret if you wish. Why, maybe some of your best friends, office associates or neighbors have an ostomy ... you never can tell. People facing ostomy surgery have many quality-of-life questions like: Will you bulge? Smell? Make noises? Will you feel waste leaving your body? Will you be a captive of the toilet? Will you starve? Be a social outcast? Get/stay married? Have babies?  Easily bathe, go swimming, bend over?  OK, lets look at what you can expect:   Will you bulge?  Remember, without part of the intestine or bladder, and its contents, you should have a flatter tummy than before. You can expect to wear, with little exception, what you wore before surgery ... and this in-cludes tight clothing and bathing suits.   Will you smell?  Today, thanks to modern odor proof pouching systems, you can walk into an ostomy support group meeting and not smell anything that is foul or offensive. And, for those with an ileostomy or colostomy who are concerned about odor when emptying their pouch, there are in-pouch deodorants that can be used to eliminate any waste odors that may exist.   Will you make noises?  Everyone produces gas, especially if they are an air-swallower. But intestinal sounds that occur from time to time are no differ-ent than a gurgling tummy, and quite often your clothing will muffle any sounds.   Will you feel the waste discharges?  For those with a colostomy or ileostomy there might be a slight pressure when waste leaves your body, but understand that the intestines have no nerve endings, so there will be no unpleasant sensations. Those with a urostomy will probably be unaware of any kidney drainage.   Will you be a captive of the toilet?  Immediately post-op you will spend more time in the bathroom than you will after your body recovers from surgery. Every person is different, but on average those with an ileostomy or urostomy may empty their pouches 4 to 6 times a day; a little  less if you have a colostomy. The average wear time between pouch system changes is 3 to 5 days and the changing process should take less than 30 minutes.   Will I need to be on a special diet? Most people return to their normal diet when they have recovered from surgery. Be sure to chew your food well, eat a well-balanced diet and drink plenty of fluids. If you experience problems  with a certain food, wait a couple of weeks and try it again.  Will there be odor and noises? Pouching systems are designed to be odor-proof or odor-resistant. There are deodorants that can be used in the pouch. Medications are also available to help reduce odor. Limit gas-producing foods and carbonated beverages. You will experience less gas and fewer noises as you heal from surgery.  How much time will it take to care for my ostomy? At first, you may spend a lot of time learning about your ostomy and how to take care of it. As you become more comfortable and skilled at changing the pouching system, it will take very little time to care for it.   Will I be able to return to work? People with ostomies can perform most jobs. As soon as you have healed from surgery, you should be able to return to work. Heavy lifting (more than 10 pounds) may be discouraged.   What about intimacy? Sexual relationships and intimacy are important and fulfilling aspects of your life. They should  continue after ostomy surgery. Intimacy-related concerns should be discussed openly between you and your partner.  ° °Can I wear regular clothing? °You do not need to wear special clothing. Ostomy pouches are fairly flat and barely noticeable. Elastic undergarments will not hurt the stoma or prevent the ostomy from functioning.  ° °Can I participate in sports? °An ostomy should not limit your involvement in sports. Many people with ostomies are runners, skiers, swimmers or participate in other active lifestyles. Talk with your caregiver first before doing heavy physical activity. ° °Will you starve?  °Not if you follow doctor’s orders at each stage of your post-op adjustment. There is no such thing as an “ostomy diet”. Some people with an ostomy will be able to eat and tolerate anything; others may find diffi-culty with some foods. Each person is an individual and must determine, by trial, what is best for them. A good practice for  all is to drink plenty of water.  ° °Will you be a social outcast?  °Have you met anyone who has an ostomy and is a social outcast? Why should you be the first? Only your attitude and self image will effect how you are treated. No confi-dent person is an outcast.  ° ° °PROFESSIONAL HELP  ° °Resources are available if you need help or have questions about your ostomy.  ° °· Specially trained nurses called Wound, Ostomy Continence Nurses (WOCN) are available for consultation in most major medical centers. ° °· Consider getting an ostomy consult at one of the outpatient ostomy clinics.   ° °· The [employer] Association ([employer]) is a group made up of many local chapters throughout the [place]. These local groups hold meetings and provide support to prospective and existing ostomates. They sponsor educational events and have qualified visitors to make personal or telephone visits. Contact the UOA for the chapter nearest you and for other educational publications. ° °· More detailed information can be found in Colostomy Guide, a publication of the [employer] (UOA). Contact UOA at 1-800-826-0826 or visit their web site at www.uoaa.org. The website contains links to other sites, suppliers and resources. ° °· [facility] Secure Start Services: °· Start at the website to enlist for support.  Your Wound Ostomy (WOCN) nurse may have started this process. https://www.[facility].com/en/securestart °· Secure Start services are designed to support people as they live their lives with an ostomy or neurogenic bladder. Enrolling is easy and at no cost to the patient. We realize that each person's needs and life journey are different. Through Secure Start services, we want to help people live their life, their way. ° ° ° °Diverticulitis ° °Diverticulitis is infection or inflammation of small pouches (diverticula) in the colon that form due to a condition called diverticulosis. Diverticula can trap stool (feces) and  bacteria, causing infection and inflammation. °Diverticulitis may cause severe stomach pain and diarrhea. It may lead to tissue damage in the colon that causes bleeding or blockage. The diverticula may also burst (rupture) and cause infected stool to enter other areas of the abdomen. °What are the causes? °This condition is caused by stool becoming trapped in the diverticula, which allows bacteria to grow in the diverticula. This leads to inflammation and infection. °What increases the risk? °You are more likely to develop this condition if you have diverticulosis. The risk increases if you: °· Are overweight or obese. °· Do not get enough exercise. °· Drink alcohol. °· Use tobacco products. °· Eat a diet that has   a lot of red meat such as beef, pork, or lamb.  Eat a diet that does not include enough fiber. High-fiber foods include fruits, vegetables, beans, nuts, and whole grains.  Are over 57 years of age. What are the signs or symptoms? Symptoms of this condition may include:  Pain and tenderness in the abdomen. The pain is normally located on the left side of the abdomen, but it may occur in other areas.  Fever and chills.  Nausea.  Vomiting.  Cramping.  Bloating.  Changes in bowel routines.  Blood in your stool. How is this diagnosed? This condition is diagnosed based on:  Your medical history.  A physical exam.  Tests to make sure there is nothing else causing your condition. These tests may include: ? Blood tests. ? Urine tests. ? CT scan of the abdomen. How is this treated? Most cases of this condition are mild and can be treated at home. Treatment may include:  Taking over-the-counter pain medicines.  Following a clear liquid diet.  Taking antibiotic medicines by mouth.  Resting. More severe cases may need to be treated at a hospital. Treatment may include:  Not eating or drinking.  Taking prescription pain medicine.  Receiving antibiotic medicines through  an IV.  Receiving fluids and nutrition through an IV.  Surgery. When your condition is under control, your health care provider may recommend that you have a colonoscopy. This is an exam to look at the entire large intestine. During the exam, a lubricated, bendable tube is inserted into the anus and then passed into the rectum, colon, and other parts of the large intestine. A colonoscopy can show how severe your diverticula are and whether something else may be causing your symptoms. Follow these instructions at home: Medicines  Take over-the-counter and prescription medicines only as told by your health care provider. These include fiber supplements, probiotics, and stool softeners.  If you were prescribed an antibiotic medicine, take it as told by your health care provider. Do not stop taking the antibiotic even if you start to feel better.  Ask your health care provider if the medicine prescribed to you requires you to avoid driving or using machinery. Eating and drinking  Follow a full liquid diet or another diet as directed by your health care provider.  After your symptoms improve, your health care provider may tell you to change your diet. He or she may recommend that you eat a diet that contains at least 25 grams (25 g) of fiber daily. Fiber makes it easier to pass stool. Healthy sources of fiber include: ? Berries. One cup contains 4-8 grams of fiber. ? Beans or lentils. One-half cup contains 5-8 grams of fiber. ? Green vegetables. One cup contains 4 grams of fiber.  Avoid eating red meat.   General instructions  Do not use any products that contain nicotine or tobacco, such as cigarettes, e-cigarettes, and chewing tobacco. If you need help quitting, ask your health care provider.  Exercise for at least 30 minutes, 3 times each week. You should exercise hard enough to raise your heart rate and break a sweat.  Keep all follow-up visits as told by your health care provider. This  is important. You may need to have a colonoscopy. Contact a health care provider if:  Your pain does not improve.  Your bowel movements do not return to normal. Get help right away if:  Your pain gets worse.  Your symptoms do not get better with treatment.  Your  symptoms suddenly get worse.  You have a fever.  You vomit more than one time.  You have stools that are bloody, black, or tarry. Summary  Diverticulitis is infection or inflammation of small pouches (diverticula) in the colon that form due to a condition called diverticulosis. Diverticula can trap stool (feces) and bacteria, causing infection and inflammation.  You are at higher risk for this condition if you have diverticulosis and you eat a diet that does not include enough fiber.  Most cases of this condition are mild and can be treated at home. More severe cases may need to be treated at a hospital.  When your condition is under control, your health care provider may recommend that you have an exam called a colonoscopy. This exam can show how severe your diverticula are and whether something else may be causing your symptoms.  Keep all follow-up visits as told by your health care provider. This is important. This information is not intended to replace advice given to you by your health care provider. Make sure you discuss any questions you have with your health care provider. Document Revised: 12/28/2018 Document Reviewed: 12/28/2018 Elsevier Patient Education  2021 [employer].

## 2020-04-18 NOTE — Progress Notes (Signed)
This nurse is encouraging pt to drink his bowel prep but the patient states that he just cannot drink any more. He has not finished the first bottle.

## 2020-04-18 NOTE — Progress Notes (Signed)
[facility] PROGRESS NOTE  Chase [name] DSK:[id number] DOB: [birth year] DOA: 03/31/2020 PCP: [facility], [name]      Status: Remains inpatient appropriate because:Unsafe d/c plan and Inpatient level of care appropriate due to severity of illness   Dispo: The patient is from: Home              Anticipated d/c is to: SNF              Anticipated d/c date is: > 3 days              Patient currently is not medically stable to d/c. Barriers to discharge: Needs to undergo diverting loop colostomy, PT recommending SNF at present we have not received any bed offers from [place] affiliated facilities   Code Status: Full code Family Communication: Patient only DVT prophylaxis: SQ heparin Vaccination status: Did receive 1 dose of the Moderna COVID-vaccine.  Subsequently refusing any future doses or even a booster shot.  States he had COVID 3 months ago and has reduced immune system and does not feel these immunizations are effective for him.  Foley catheter: Resented with chronic Foley catheter, also has bilateral percutaneous nephrostomy tubes  HPI: HPI on 03/31/2020 by Dr. 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 Patient was reportedly seen by neighbors yesterday in a recliner and was then found on the floor today obtunded. He was sent to [name] department and is unable to provide history due to his clinical  condition.  Interim history Admitted with acute renal failure on chronic kidney disease, stage V secondary to urinary obstruction and dehydration with electrolyte abnormalities.  Nephrology consulted.  Patient did undergo hemodialysis.  IR placed bilateral percutaneous nephrostomy tubes.  Creatinine did improve with a couple cycles of hemodialysis however plateaued.  Hemodialysis catheter was then removed.  No further plans for hemodialysis at this time.  Nephrology feels that patient can be discharged home with outpatient follow-up.  At this time, discharge is complicated by patient being extremely weak and deconditioned.  Initially patient did not want to be discharged to SNF however this morning he states that he will consider it.   Subjective: Awake.  Currently having multiple watery stools after bowel prep.  Staff preparing patient for OR this morning.  Objective: Vitals:   04/17/20 1254 04/18/20 0610  BP: 118/72 (!) 97/58  Pulse:  75  Resp: 12 18  Temp: 98.1 F (36.7 C) 98 F (36.7 C)  SpO2: 98% 100%    Intake/Output Summary (Last 24 hours) at 04/18/2020 0653 Last data filed at 04/18/2020 0600 Gross per 24 hour  Intake 254.63 ml  Output 755 ml  Net -500.37 ml   Filed Weights   04/16/20 0500 04/17/20 0511 04/18/20 0610  Weight: 53.1 kg 50.4 kg 52.4 kg    Exam: Constitutional: very cachectic in appearance Respiratory: Bilateral lung sounds clear to auscultation posteriorly, stable on room air, no increased work of breathing at rest Cardiovascular: Heart sounds are S1-S2, no peripheral edema, extremities warm to touch with adequate capillary refill,  pulses regular Abdomen: JP drain to left abdomen with opaque fluid noted, abdomen soft, BS + and normoactive Genitourinary: Bilateral percutaneous nephrostomy tubes in place draining clear yellow urine to collection bag; also has chronic Foley catheter draining yellow urine to bedside bag Neurologic: CN 2-12 grossly intact.  Sensation intact, DTR normal. Strength 5/5 x all 4 extremities.  Psychiatric: Normal judgment and insight. Alert and oriented x 3.  Flat mood and affect.    Assessment/Plan: Acute problems: Acute renal failure on chronic kidney disease, stage V -Nephrology consulted and appreciated -Creatinine 5.76 in October 2021, although patient presented with a creatinine of 16.69 -Chronic kidney disease 2/2 longstanding obstruction.  Acute kidney injury due to dehydration and sepsis along with ATN with obstruction. -Interventional radiology consulted for bilateral percutaneous nephrostomy tube placement, 04/01/2020 as well as temporary HD catheter placement. -Patient did undergo hemodialysis, 2 cycles with last hemodialysis on 04/04/2019. -Creatinine has now plateaued, currently 5.08 -Though creatinine is still elevated, it is below his baseline.  Patient himself is unsure about wanting continuous HD.  Nephrology recommended that patient follow-up with nephrology as an outpatient.  Recommended continuing bicarb on discharge.  Bilateral hydronephrosis -Per IR follow-up on 04/07/2019, patient had urinary obstruction secondary to pelvic mass with subsequent bilateral hydronephrosis in the setting of bilateral ureteral stents, bilateral PCNs inadvertently removed (unknown how long they have been out), status post bilateral PCN placement on 04/01/2020. -1/18 Dr Alinda Money performing concurrent procedure with surgical team (cystoscopy and bilateral ureteral stent change) -Presented w/ chronic Foley catheter as well. -Also has history of Hodgkin's lymphoma and is status postchemotherapy and radiation which may have also contributed to recurrent obstructive renal disease  History of pelvic abscess/known colovesical fistula -Noted on 11/15/2019, status post left transgluteal drain by IR/colovesicular fistula -1/18 she will undergo diverting loop colostomy by [name] in conjunction with urological procedure by Dr. Alinda Money as  described above -Primary GI is Dr. Rush Landmark. Dr Carlean Purl plans on preop colonoscopy 1/18 (today) -Cont scheduled Zofran for recurrent nausea  Severe physical deconditioning, history of prior CVA -July 2021, history of atrial fibrillation, patient is not a candidate for anticoagulation due to GI bleed and thrombocytopenia -Currently no focal deficits  Severe protein calorie malnutrition Nutrition Problem: Severe Malnutrition Etiology: chronic illness (colovesical fistula) Signs/Symptoms: moderate fat depletion,severe fat depletion,moderate muscle depletion,severe muscle depletion Interventions: MVI,Refer to RD note for recommendations Estimated body mass index is 18.65 kg/m as calculated from the following:   Height as of this encounter: 5' 6"  (1.676 m).   Weight as of this encounter: 52.4 kg.  Chronic pain -Continue low-dose Percocet along with Neurontin -Discussed with nephrology and pharmacy- continue low-dose Zanaflex   Other problems: Severe dehydration with multiple metabolic derangements as follows: Severe metabolic acidosis, hyperkalemia, hyperphosphatemia -Resolved after rehydration, dialysis and Kayexalate  Severe sepsis secondary to urinary tract infection -Present on admission-sepsis physiology has now resolved -Patient presented with encephalopathy as well as hypothermia and leukocytosis -Blood cultures were negative x2 -Urine culture from 12/31 and 1/1 showed multiple species -CT renal stone study 12/31: Bladder wall appeared slightly thickened.  No significant fluid collection in pelvis -Patient completed 7 days of IV cefepime  Goals of care -Palliative care consulted and appreciated -Patient full code and refused to discuss any decisions regarding plan of care -Patient should follow up with palliative care as an outpatient  PTSD -Sedatives held  History of gastric bypass  -Avoid NSAIDs (unable to use currently in context of chronic kidney disease)  Data  Reviewed:  Basic Metabolic Panel: Recent Labs  Lab 04/12/20 0335 04/17/20 0307  NA 137 137  K 4.8 4.6  CL 106 103  CO2 20* 25  GLUCOSE 82 95  BUN 42* 29*  CREATININE 5.15* 4.97*  CALCIUM 7.7* 8.3*   Liver Function Tests: Recent Labs  Lab 04/17/20 0307  AST 33  ALT 26  ALKPHOS 122  BILITOT 0.5  PROT 6.4*  ALBUMIN 2.3*   No results for input(s): LIPASE, AMYLASE in the last 168 hours. No results for input(s): AMMONIA in the last 168 hours. CBC: Recent Labs  Lab 04/17/20 0307  WBC 6.8  HGB 9.4*  HCT 31.3*  MCV 97.5  PLT 341   Cardiac Enzymes: No results for input(s): CKTOTAL, CKMB, CKMBINDEX, TROPONINI in the last 168 hours. BNP (last 3 results) No results for input(s): BNP in the last 8760 hours.  ProBNP (last 3 results) No results for input(s): PROBNP in the last 8760 hours.  CBG: Recent Labs  Lab 04/17/20 0205 04/17/20 0506 04/17/20 0925 04/17/20 1251 04/17/20 1731  GLUCAP 88 97 85 86 89    Recent Results (from the past 240 hour(s))  Surgical pcr screen     Status: None   Collection Time: 04/13/20  4:48 AM   Specimen: Nasal Mucosa; Nasal Swab  Result Value Ref Range Status   MRSA, PCR NEGATIVE NEGATIVE Final   Staphylococcus aureus NEGATIVE NEGATIVE Final    Comment: (NOTE) The Xpert SA Assay (FDA approved for NASAL specimens in patients 54 years of age and older), is one component of a comprehensive surveillance program. It is not intended to diagnose infection nor to guide or monitor treatment. Performed at [facility], [place] [address], [place], [place] [address]      Studies: No results found.  Scheduled Meds: . alvimopan  12 mg Oral On Call to OR  . bupivacaine liposome  20 mL Infiltration Once  . calcium carbonate  1 tablet Oral TID  . Chlorhexidine Gluconate Cloth  6 each Topical Daily  . DULoxetine  60 mg Oral Daily   And  . DULoxetine  30 mg Oral QHS  . feeding supplement  296 mL Oral Once  . feeding supplement  592 mL  Oral Once  . gabapentin  300 mg Oral On Call to OR  . heparin  5,000 Units Subcutaneous Q8H  . multivitamin with minerals  1 tablet Oral BID  . ondansetron  4 mg Oral Q6H   Or  . ondansetron (ZOFRAN) IV  4 mg Intravenous Q6H  . pantoprazole  40 mg Oral QHS  . peg 3350 powder  0.5 kit Oral Once  . senna-docusate  1 tablet Oral BID  . sodium bicarbonate  1,300 mg Oral TID  . sodium chloride flush  5 mL Intracatheter Q8H  . tamsulosin  0.4 mg Oral QHS   Continuous Infusions: . cefoTEtan (CEFOTAN) IV    . lactated ringers 50 mL/hr at 04/18/20 0128    Principal Problem:   Sepsis with acute renal failure without septic shock (HCC) Active Problems:   CKD (chronic kidney disease), stage IV (HCC)   Chronic pain   Protein-calorie malnutrition, severe   Colovesical fistula   Acute renal failure (HCC)   History of COVID-19   History of gastric bypass   Hodgkin lymphoma, unspecified, unspecified site ([place])   Metabolic acidosis   Toxic metabolic encephalopathy   PTSD (post-traumatic stress disorder)   Bilateral ureteral obstruction s/p perc nephrosotmy & stenting   History  of CVA (cerebrovascular accident)   Incisional hernia - epigastric   DNR (do not resuscitate) discussion   Palliative care by specialist   Adult failure to thrive   Consultants:    Procedures:    Antibiotics: Anti-infectives (From admission, onward)   Start     Dose/Rate Route Frequency Ordered Stop   04/18/20 1200  cefoTEtan (CEFOTAN) 2 g in sodium chloride 0.9 % 100 mL IVPB        2 g 200 mL/hr over 30 Minutes Intravenous To ShortStay Surgical 04/17/20 2206 04/19/20 1200   04/17/20 2215  neomycin (MYCIFRADIN) tablet 1,000 mg       "And" Linked Group Details   1,000 mg Oral  Once 04/17/20 2206 04/17/20 2348   04/17/20 2215  metroNIDAZOLE (FLAGYL) tablet 1,000 mg       "And" Linked Group Details   1,000 mg Oral  Once 04/17/20 2206 04/17/20 2336   04/13/20 1145  cefoTEtan (CEFOTAN) 2 g in sodium  chloride 0.9 % 100 mL IVPB        2 g 200 mL/hr over 30 Minutes Intravenous On call to O.R. 04/12/20 2006 04/14/20 0559   04/12/20 2100  neomycin (MYCIFRADIN) tablet 1,000 mg       "And" Linked Group Details   1,000 mg Oral 3 times per day 04/12/20 2006 04/13/20 0527   04/12/20 2100  metroNIDAZOLE (FLAGYL) tablet 1,000 mg       "And" Linked Group Details   1,000 mg Oral 3 times per day 04/12/20 2006 04/13/20 0527   04/03/20 1600  ceFEPIme (MAXIPIME) 1 g in sodium chloride 0.9 % 100 mL IVPB        1 g 200 mL/hr over 30 Minutes Intravenous Every 24 hours 04/03/20 0918 04/07/20 1108   04/01/20 1600  ceFEPIme (MAXIPIME) 1 g in sodium chloride 0.9 % 100 mL IVPB  Status:  Discontinued        1 g 200 mL/hr over 30 Minutes Intravenous Every 24 hours 03/31/20 1550 04/03/20 0918   04/01/20 1145  cefTRIAXone (ROCEPHIN) 1 g in sodium chloride 0.9 % 100 mL IVPB        1 g 200 mL/hr over 30 Minutes Intravenous  Once 04/01/20 1057 04/01/20 1308   03/31/20 1415  ceFEPIme (MAXIPIME) 2 g in sodium chloride 0.9 % 100 mL IVPB        2 g 200 mL/hr over 30 Minutes Intravenous  Once 03/31/20 1407 03/31/20 1516       Time spent:  20 minutes    Erin Hearing ANP  [facility] 7 am - 330 pm/M-F for direct patient care and secure chat Please refer to Amion for contact info 18  days

## 2020-04-18 NOTE — Anesthesia Preprocedure Evaluation (Signed)
Anesthesia Evaluation  Patient identified by MRN, date of birth, ID band Patient awake    Reviewed: Allergy & Precautions, NPO status , Patient's Chart, lab work & pertinent test results  Airway Mallampati: II  TM Distance: >3 FB     Dental   Pulmonary shortness of breath,    breath sounds clear to auscultation       Cardiovascular + Past MI   Rhythm:Regular Rate:Normal     Neuro/Psych Anxiety Depression  Neuromuscular disease CVA    GI/Hepatic Neg liver ROS, GERD  ,  Endo/Other    Renal/GU Renal disease     Musculoskeletal   Abdominal   Peds  Hematology  (+) anemia ,   Anesthesia Other Findings   Reproductive/Obstetrics                             Anesthesia Physical Anesthesia Plan  ASA: III  Anesthesia Plan: General   Post-op Pain Management:    Induction:   PONV Risk Score and Plan: 2 and Ondansetron, Dexamethasone and Midazolam  Airway Management Planned: Oral ETT  Additional Equipment:   Intra-op Plan:   Post-operative Plan: Possible Post-op intubation/ventilation  Informed Consent: I have reviewed the patients History and Physical, chart, labs and discussed the procedure including the risks, benefits and alternatives for the proposed anesthesia with the patient or authorized representative who has indicated his/her understanding and acceptance.     Dental advisory given  Plan Discussed with: CRNA and Anesthesiologist  Anesthesia Plan Comments:         Anesthesia Quick Evaluation

## 2020-04-18 NOTE — Progress Notes (Signed)
PT Cancellation Note  Patient Details Name: Chase CHIZMAR Sr. MRN: [id number] DOB: 04/13/1965   Cancelled Treatment:    Reason Eval/Treat Not Completed: Patient at procedure or test/unavailable (plan for diverting colostomy).  Wyona Almas, PT, DPT Acute Rehabilitation Services Pager [phone] Office [phone]    Deno Etienne 04/18/2020, 10:48 AM

## 2020-04-19 ENCOUNTER — Encounter (HOSPITAL_COMMUNITY): Payer: Self-pay | Admitting: Surgery

## 2020-04-19 DIAGNOSIS — R0603 Acute respiratory distress: Secondary | ICD-10-CM

## 2020-04-19 DIAGNOSIS — Z933 Colostomy status: Secondary | ICD-10-CM | POA: Diagnosis not present

## 2020-04-19 DIAGNOSIS — N170 Acute kidney failure with tubular necrosis: Secondary | ICD-10-CM | POA: Diagnosis not present

## 2020-04-19 DIAGNOSIS — J9601 Acute respiratory failure with hypoxia: Secondary | ICD-10-CM | POA: Diagnosis not present

## 2020-04-19 DIAGNOSIS — R627 Adult failure to thrive: Secondary | ICD-10-CM | POA: Diagnosis not present

## 2020-04-19 DIAGNOSIS — G8929 Other chronic pain: Secondary | ICD-10-CM | POA: Diagnosis not present

## 2020-04-19 DIAGNOSIS — N179 Acute kidney failure, unspecified: Secondary | ICD-10-CM | POA: Diagnosis not present

## 2020-04-19 DIAGNOSIS — F609 Personality disorder, unspecified: Secondary | ICD-10-CM

## 2020-04-19 DIAGNOSIS — N185 Chronic kidney disease, stage 5: Secondary | ICD-10-CM | POA: Diagnosis not present

## 2020-04-19 DIAGNOSIS — N321 Vesicointestinal fistula: Secondary | ICD-10-CM | POA: Diagnosis not present

## 2020-04-19 DIAGNOSIS — N135 Crossing vessel and stricture of ureter without hydronephrosis: Secondary | ICD-10-CM | POA: Diagnosis not present

## 2020-04-19 HISTORY — DX: Personality disorder, unspecified: F60.9

## 2020-04-19 LAB — CBC
HCT: 28.9 % — ABNORMAL LOW (ref 39.0–52.0)
Hemoglobin: 9.1 g/dL — ABNORMAL LOW (ref 13.0–17.0)
MCH: 30.3 pg (ref 26.0–34.0)
MCHC: 31.5 g/dL (ref 30.0–36.0)
MCV: 96.3 fL (ref 80.0–100.0)
Platelets: 349 10*3/uL (ref 150–400)
RBC: 3 MIL/uL — ABNORMAL LOW (ref 4.22–5.81)
RDW: 15.8 % — ABNORMAL HIGH (ref 11.5–15.5)
WBC: 7.9 10*3/uL (ref 4.0–10.5)
nRBC: 0 % (ref 0.0–0.2)

## 2020-04-19 LAB — GLUCOSE, CAPILLARY
Glucose-Capillary: 69 mg/dL — ABNORMAL LOW (ref 70–99)
Glucose-Capillary: 66 mg/dL — ABNORMAL LOW (ref 70–99)
Glucose-Capillary: 108 mg/dL — ABNORMAL HIGH (ref 70–99)
Glucose-Capillary: 76 mg/dL (ref 70–99)
Glucose-Capillary: 86 mg/dL (ref 70–99)
Glucose-Capillary: 115 mg/dL — ABNORMAL HIGH (ref 70–99)

## 2020-04-19 LAB — BASIC METABOLIC PANEL
Anion gap: 12 (ref 5–15)
BUN: 25 mg/dL — ABNORMAL HIGH (ref 6–20)
CO2: 20 mmol/L — ABNORMAL LOW (ref 22–32)
Calcium: 8.2 mg/dL — ABNORMAL LOW (ref 8.9–10.3)
Chloride: 103 mmol/L (ref 98–111)
Creatinine, Ser: 4.64 mg/dL — ABNORMAL HIGH (ref 0.61–1.24)
GFR, Estimated: 14 mL/min — ABNORMAL LOW (ref >60)
Glucose, Bld: 141 mg/dL — ABNORMAL HIGH (ref 70–99)
Potassium: 4.2 mmol/L (ref 3.5–5.1)
Sodium: 135 mmol/L (ref 135–145)

## 2020-04-19 MED ORDER — HYDROXYZINE HCL 10 MG PO TABS
10.0000 mg | ORAL_TABLET | Freq: Once | ORAL | Status: AC
  Administered 2020-04-19: 10 mg via ORAL
  Filled 2020-04-19: qty 1

## 2020-04-19 MED ORDER — LACTATED RINGERS IV BOLUS: 1000.0000 mL | Freq: Three times a day (TID) | INTRAVENOUS | Status: DC | PRN

## 2020-04-19 MED ORDER — ACETAMINOPHEN 500 MG PO TABS
500.0000 mg | ORAL_TABLET | Freq: Four times a day (QID) | ORAL | Status: AC
  Administered 2020-04-20 – 2020-04-21 (×8): 500 mg via ORAL
  Filled 2020-04-19 (×8): qty 1

## 2020-04-19 MED ORDER — ALVIMOPAN 12 MG PO CAPS
12.0000 mg | ORAL_CAPSULE | Freq: Two times a day (BID) | ORAL | Status: AC
  Administered 2020-04-19 – 2020-04-21 (×5): 12 mg via ORAL
  Filled 2020-04-19 (×5): qty 1

## 2020-04-19 MED ORDER — OXYCODONE HCL 5 MG PO TABS
5.0000 mg | ORAL_TABLET | ORAL | Status: DC | PRN
  Administered 2020-04-20 (×2): 5 mg via ORAL
  Filled 2020-04-19 (×2): qty 1

## 2020-04-19 MED ORDER — GABAPENTIN 100 MG PO CAPS
100.0000 mg | ORAL_CAPSULE | Freq: Once | ORAL | Status: AC
  Administered 2020-04-19: 100 mg via ORAL
  Filled 2020-04-19: qty 1

## 2020-04-19 MED ORDER — ACETAMINOPHEN 325 MG PO TABS
325.0000 mg | ORAL_TABLET | Freq: Four times a day (QID) | ORAL | Status: DC | PRN
Start: 2020-04-19 — End: 2020-04-21

## 2020-04-19 NOTE — Progress Notes (Signed)
Patient ID: Chase Picker., male   DOB: [birth year], 55 y.o.   MRN: [id number]   Palliative Medicine Inpatient Follow Up Note   HPI: Past medical history of Hodgkin's lymphoma treated with chemotherapy, gastric bypass in 1996 for which he subsequently developed complications with anastomotic ulcer in 2014, colovesicular fistula associated with abscess status post percutaneous drain, chronic kidney disease stage IV, bilateral ureteral stenosis with ureteral stent and percutaneous bilateral nephrostomy tubes,  history of a CVA in 2021, PTSD, and opioid misuse admitted on 03/31/2020 with AMS and nephrostomy tubes out. Uroseptic  CT scan showed bilateral hydronephrosis with a left transgluteal drainage without fluid collection and a large colonic stool burden. Bilateral percutaneous nephrostomy tubes placed by IR 1/1. Plans for short term HD vs CRRT. Poor candidate for outpatient dialysis. PMT consulted to discuss [place].   Per Nephrology no need for dialysis at this time, temporary cath removed today. Patient is likely going to face decision regarding dialysis in the near future.     Today is day 19 of this hospital stay.    S/P  COLORECTAL POST-OPERATIVE DIAGNOSIS: SIGMOID DIVERTICULITIS EPIGASTRIC INCISIONAL HERNIA  PROCEDURE:  LAPAROSCOPIC SIGMOID COLOTOMY WITH COLOSTOMY([place]) INCISIONAL EPIGASTRICHERNIA REPAIR - PRIMARY SUTURE  SURGEON: Adin Hector, MD  UROLOGY Postoperative diagnosis: 1. Bilateral ureteral obstruction  Procedure:  1. Cystoscopy 2. Bilateralureteral stent replacement (RIGHT 6Fr x 26cm, LEFT 6Fr x 28cm) 3. Bilateral retrograde pyelogram  Patient faces treatment option decisions, advanced directive decisions and anticipatory care needs.   Today's Discussion : 04-19-19  Visited patient at bedside.  [facility] [name] is alert and oriented pleasant and engaged in conversation with me today.  He verbalizes no complaints and acknowledges that the  neck steps in transition of care for him would be a skilled nursing facility.  Continued education regarding current medical situation; diagnosis, prognosis, GOCs, and options.  Education offered on the importance of advance care planning, and documentation of healthcare power of attorney.  Encouraged [name] to continue to have conversation with his family and his healthcare providers regarding decisions around medical interventions ensuring that they are within the context of his goals of care.  Up to this point patient has been unable/unwilling to make any definitive decision regarding healthcare power of attorney or advanced care planning.  He remains a full code.  No questions or concerns verbalized at this time  Patient declined my offer to contact his sons and update them on his current medical situation.  He tells me that he plans to talk to them today.    Vital Signs Vitals:   04/19/20 0639 04/19/20 0944  BP: 108/60 137/87  Pulse: 72 85  Resp: 16 18  Temp: 99.3 F (37.4 C) 99 F (37.2 C)  SpO2: 100% 99%    Intake/Output Summary (Last 24 hours) at 04/19/2020 1016 Last data filed at 04/19/2020 0730 Gross per 24 hour  Intake 3475 ml  Output 1525 ml  Net 1950 ml   Last Weight  Most recent update: 04/18/2020  6:15 AM   Weight  52.4 kg (115 lb 8.3 oz)            Recommendations and Plan:  -Full code -Patient is still open and willing for SNF on discharge -PMT will continue to support holistically, recommend team meeting to include attending and palliative medicine along with the patient and both of his sons for early next week.   Time Spent: 25 minutes Greater than 50% of the time was spent in  counseling and coordination of care ______________________________________________________________________________________ Wadie Lessen NP  [facility] Team Team Cell Phone: [phone] Please utilize secure chat with additional questions, if there is  no response within 30 minutes please call the above phone number  Palliative Medicine Team providers are available by phone from 7am to 7pm daily and can be reached through the team cell phone.  Should this patient require assistance outside of these hours, please call the patient's attending physician.

## 2020-04-19 NOTE — Progress Notes (Signed)
Physical Therapy Treatment Patient Details Name: Chase SKEET Sr. 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 He was admitted for acute renal failure complicating stage V CKD secondary to urinary obstruction and dehydration with severe electrolyte derangements.  Urology was consulted.  Patient underwent hemodialysis. s/p ureteral stent placement, retrograde pyelogram, lap sigmoid colectomy with colostomy, and epigastric hernia repair on 1/18.    PT Comments    Pt complaining of severe abdominal pain upon arrival to room, but agreeable to mobility with PT. Pt continues to present with significant orthostatic hypotension with mobility, along with difficulty performing bed mobility and transfers s/p surgery yesterday. Pt is motivated to progress, as able. Will continue to follow acutely.   BP, HR: - supine: 124/82, 75 - sitting:  96/78, 99 - standing: 93/64, 121   Follow Up Recommendations  SNF     Equipment Recommendations  None recommended by PT    Recommendations for Other Services       Precautions / Restrictions Precautions Precautions: Fall;Other (comment) (abdominal) Precaution Comments: x2 nephrostomy bags, x1 bulb R posterior flank, and colostomy Restrictions Weight Bearing Restrictions: No    Mobility  Bed Mobility Overal bed mobility: Needs Assistance Bed Mobility: Rolling;Sidelying to Sit Rolling: Min assist Sidelying to sit: Mod assist       General bed mobility comments: min assist for roll to L, mod assist for trunk elevation and LE lowering over EOB. Increased time and  effort, reporting dizziness immediately upon sitting EOB (see orthostatic vitals in assessment).  Transfers Overall transfer level: Needs assistance Equipment used: Rolling walker (2 wheeled) Transfers: Sit to/from Stand Sit to Stand: Mod assist         General transfer comment: Mod assist for power up, rise, and steady. Verbal cuing for hand placement when rising. Increased dizziness upon standing.  Ambulation/Gait Ambulation/Gait assistance: Min assist Gait Distance (Feet): 2 Feet Assistive device: Rolling walker (2 wheeled) Gait Pattern/deviations: Step-to pattern;Decreased step length - right;Decreased step length - left;Trunk flexed Gait velocity: decr   General Gait Details: x2 steps forward and x2 steps backward with RW, min assist to steady and manage lines/leads. Further gait not attempted secondary to symptomatic orthostatic hypotension.   Stairs             Wheelchair Mobility    Modified Rankin (Stroke Patients Only)       Balance Overall balance assessment: Needs assistance Sitting-balance support: Feet supported Sitting balance-Leahy Scale: Fair     Standing balance support: Bilateral upper extremity supported Standing balance-Leahy Scale: Poor Standing balance comment: reliant on external assist                            Cognition Arousal/Alertness: Awake/alert Behavior During Therapy: WFL for tasks assessed/performed Overall Cognitive Status: Within Functional Limits for tasks assessed                                 General Comments: pleasant, hardworking, limited by abdominal pain and orthostasis in responsiveness today      Exercises General Exercises - Lower  Extremity Long Arc Quad: AROM;Both;10 reps;Seated Hip Flexion/Marching: AROM;Both;5 reps;Standing    General Comments General comments (skin integrity, edema, etc.): + orthostatic vitals      Pertinent Vitals/Pain Pain Assessment: 0-10 Pain Score: 9   Pain Location: abdomen Pain Descriptors / Indicators: Spasm;Discomfort Pain Intervention(s): Limited activity within patient's tolerance;Monitored during session;RN gave pain meds during session;Repositioned    Home Living                      Prior Function            PT Goals (current goals can now be found in the care plan section) Acute Rehab PT Goals Time For Goal Achievement: 04/20/20 Potential to Achieve Goals: Fair Progress towards PT goals: Progressing toward goals    Frequency    Min 3X/week      PT Plan Current plan remains appropriate    Co-evaluation              AM-PAC PT "6 Clicks" Mobility   Outcome Measure  Help needed turning from your back to your side while in a flat bed without using bedrails?: A Little Help needed moving from lying on your back to sitting on the side of a flat bed without using bedrails?: A Lot Help needed moving to and from a bed to a chair (including a wheelchair)?: A Lot Help needed standing up from a chair using your arms (e.g., wheelchair or bedside chair)?: A Lot Help needed to walk in hospital room?: A Lot Help needed climbing 3-5 steps with a railing? : A Lot 6 Click Score: 13    End of Session Equipment Utilized During Treatment: Gait belt Activity Tolerance: Patient tolerated treatment well Patient left: in bed;with call bell/phone within reach;with nursing/sitter in room (sitting EOB with RN staff to empty nephrostomy bags) Nurse Communication: Mobility status;Patient requests pain meds PT Visit Diagnosis: Unsteadiness on feet (R26.81);Difficulty in walking, not elsewhere classified (R26.2);Muscle weakness (generalized) (M62.81)     Time: 8110-3159 PT Time Calculation (min) (ACUTE ONLY): 23 min  Charges:  $Therapeutic Activity: 23-37 mins                     Stacie Glaze, PT Acute Rehabilitation Services Pager [phone]  Office [phone]    Roxine Caddy E Ruffin Pyo 04/19/2020, 2:52 PM

## 2020-04-19 NOTE — Consult Note (Addendum)
[facility] Nurse ostomy consult note Stoma type/location: LLQ Colostomy Stomal assessment/size: 1 and 3/4 inches round, red, edematous, raised, lumen at center Peristomal assessment: intact with bleeding at mucocutaneous junction, controlled with pressure Treatment options for stomal/peristomal skin: skin barrier ring Output: serosanguinous  Ostomy pouching: 2pc, 2 and 3/4 inch pouching system with skin barrier ring  Education provided: None. Patient is approximately 12 hours out from procedure and is asking for pain medication saying he didn't get much sleep. Bedside RN reports that he had oxy at 4:30am. Patient falls back to sleep during the pouch change and is asleep at the time of my departure from room. Will need to assess tomorrow to see if this size system interferes with intercostal margin when he is up. If it does, may consider 1-piece flat system or when stomal edema eventually subsides, a smaller 2-piece system, such as the 2 and 1/4 inch. Enrolled patient in [employer] program: No  Supplies in room: 3 pouching set ups (2 and 3/4 inch pouches, 2 and 3/4 inch skin barriers and skin barrier rings) and scissors.  [facility] nursing team will follow, and will remain available to this patient, the nursing, surgical and medical teams.  Next visit will be tomorrow for teaching by a [facility] nursing team member.   Thanks, Maudie Flakes, MSN, RN, [place], Arther Abbott  Pager# [phone]

## 2020-04-19 NOTE — Progress Notes (Signed)
Chase [name] [id number] 03-04-1966  CARE TEAM:  PCP: [facility], [place] Team: Patient Care Team: [name] as PCP - General Raynelle Bring, MD as Consulting Physician (Urology) Michael [place], MD as Consulting Physician (General Surgery) Kandace Blitz, MD as Referring Physician (Pain Medicine) Mignon Pine, DO as Consulting Physician (Infectious Diseases) Garvin Fila, MD as Consulting Physician (Neurology) Gatha Mayer, MD as Consulting Physician (Gastroenterology) Joylene Grapes Shaune Pollack, MD as Consulting Physician (Nephrology)  Inpatient Treatment Team: Treatment Team: Attending Provider: Mendel Corning, MD; Consulting Physician: Reesa Chew, MD; Technician: Rica Koyanagi, NT; Consulting Physician: Michael Boston, MD; Consulting Physician: Raynelle Bring, MD; [place] Nurse: Meryle Ready, RN; [place] Nurse: Nadara Mode, RN; Rounding Team: Jacquelyne Balint Team, MD; Nurse Practitioner: Samella Parr, NP; Case Manager: Arna Snipe, RN; Consulting Physician: Nolon Nations, MD; [place] Nurse: Jeannie Fend, FNP; Technician: Gasper Sells, NT; Rounding Team: Ivonne Andrew, MD; Utilization Review: Tressie Stalker, RN; Registered Nurse: Gaspar Bidding, RN; Registered Nurse: Gordy Savers, RN; Physical Therapist: Louis Matte, PT   Problem List:   Principal Problem:   Sepsis with acute renal failure without septic shock [facility]) Active Problems:   Acute kidney failure ([place])   CKD (chronic kidney disease), stage IV ([place])   Protein-calorie malnutrition, severe   Pelvic abscess with colovescial fistula   History of gastric bypass   Bilateral ureteral obstruction s/p perc nephrosotmy & stenting   History of CVA (cerebrovascular accident)   Chronic pain   History of COVID-19   Hodgkin lymphoma, unspecified, unspecified site ([place])   Metabolic acidosis   Toxic metabolic encephalopathy   PTSD  (post-traumatic stress disorder)   Incisional hernia - epigastric   DNR (do not resuscitate) discussion   Palliative care by specialist   Adult failure to thrive   Acute respiratory failure with hypoxia ([place])   Diverticulitis of sigmoid colon with abscess s/p colectomy/colostomy Jeanette Caprice) 04/18/2020   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([place])   1 Day Post-Op  04/18/2020  COLORECTAL POST-OPERATIVE DIAGNOSIS:   SIGMOID DIVERTICULITIS EPIGASTRIC INCISIONAL HERNIA  PROCEDURE:   LAPAROSCOPIC SIGMOID COLOTOMY WITH COLOSTOMY ([place]) INCISIONAL EPIGASTRIC HERNIA REPAIR - PRIMARY SUTURE  SURGEON:  Adin Hector, MD  UROLOGY Postoperative diagnosis:  1. Bilateral ureteral obstruction   Procedure:  1. Cystoscopy 2. Bilateral ureteral stent replacement (RIGHT 6Fr x 26cm, LEFT 6Fr x 28cm) 3. Bilateral retrograde pyelogram  Surgeon: Louis Meckel, MD  Assessment  [facility]  [facility] Stay = 19 days)  Plan:  -ERAS protocol -adv diet  -mobilize -IVF per Urology/primary service (typically medlock POD#1 from colon standpoint) -f/u pathology -colostomy care/teaching -perc nephrostomy/ureteral stents/foley per Urology.  Hopefully can get nephrostomy tubes out if stents working better -personality disorder with intermittent noncompliance - per primary service -VTE prophylaxis- SCDs, etc -mobilize as tolerated to help recovery  Disposition:  Disposition:  The patient is from: [place] discharge to:  [place]  Anticipated Date of Discharge is:  October 23, 2020  Barriers to discharge:  Pending Clinical improvement (more likely than not  Patient currently is NOT MEDICALLY STABLE for discharge from the hospital from a surgery standpoint.   30 minutes spent in review, evaluation, examination, counseling, and coordination of care.  More than 50% of that time was spent in counseling.  04/19/2020    Subjective: (Chief complaint)  Moved to  6N  No major events  Sore but pain  controlled  Nurses in room  Tol clears  Objective:  Vital signs:  Vitals:   04/18/20 1843 04/18/20 2015 04/19/20 0254 04/19/20 0639  BP: 126/83 127/83 127/80 108/60  Pulse: 77 92 78 72  Resp: 16 15 16 16   Temp: 97.9 F (36.6 C) (!) 97.5 F (36.4 C) 98.7 F (37.1 C) 99.3 F (37.4 C)  TempSrc: Axillary Oral Oral Oral  SpO2: 100% 100% 99% 100%  Weight:      Height:        Last BM Date: 04/18/20  Intake/Output   Yesterday:  01/18 0701 - 01/19 0700 In: 3235 [P.O.:180; I.V.:2605; IV Piggyback:450] Out: 3976 [Urine:1500; Blood:100] This shift:  No intake/output data recorded.  Bowel function:  Flatus: No  BM:  No  Drains:  No more pelvic drain.   R nephrostomy tube w minimal output L nephrostomy tube w clear yellow urine Foley with clear yellow urine    Physical Exam:  General: Pt awake/alert in no acute distress Eyes: PERRL, normal EOM.  Sclera clear.  No icterus Neuro: CN II-XII intact w/o focal sensory/motor deficits. Lymph: No head/neck/groin lymphadenopathy Psych:  No delerium/psychosis/paranoia.  Oriented x 4 HENT: Normocephalic, Mucus membranes moist.  No thrush Neck: Supple, No tracheal deviation.  No obvious thyromegaly Chest: No pain to chest wall compression.  Good respiratory excursion.  No audible wheezing CV:  Pulses intact.  Regular rhythm.  No major extremity edema MS: Normal AROM mjr joints.  No obvious deformity  Abdomen: Soft.  Mildy distended.  Mildly tender at incisions only.  Colostomy w edema but viable.  No evidence of peritonitis.  No incarcerated hernias.  GU: foley in place.  No hernias Ext:  No deformity.  No mjr edema.  No cyanosis Skin: No petechiae / purpurea.  No major sores.  Warm and dry    Results:   Cultures: Recent Results (from the past 720 hour(s))  Urine culture     Status: Abnormal   Collection Time: 03/31/20  2:07 PM   Specimen: In/Out Cath Urine  Result Value Ref  Range Status   Specimen Description   Final    IN/OUT CATH URINE Performed at [facility], [address] [place], [place] [address]    Special Requests   Final    NONE Performed at [facility], [address], [place], [place] [address]    Culture MULTIPLE SPECIES PRESENT, SUGGEST RECOLLECTION (A)  Final   Report Status 04/02/2020 FINAL  Final  Blood Culture (routine x 2)     Status: None   Collection Time: 03/31/20  2:20 PM   Specimen: BLOOD  Result Value Ref Range Status   Specimen Description BLOOD BLOOD LEFT FOREARM  Final   Special Requests   Final    BOTTLES DRAWN AEROBIC AND ANAEROBIC Blood Culture adequate volume   Culture   Final    NO GROWTH 5 DAYS Performed at [facility], [address] [place], [place] [address]    Report Status 04/05/2020 FINAL  Final  Blood Culture (routine x 2)     Status: None   Collection Time: 03/31/20  2:20 PM   Specimen: BLOOD  Result Value Ref Range Status   Specimen Description BLOOD LEFT ANTECUBITAL  Final   Special Requests   Final    BOTTLES DRAWN AEROBIC AND ANAEROBIC Blood Culture adequate volume   Culture   Final    NO GROWTH 5 DAYS Performed at [facility], [address], [place], [place] [address]  Report Status 04/05/2020 FINAL  Final  Resp Panel by RT-PCR (Flu A&B, Covid) Nasopharyngeal Swab     Status: None   Collection Time: 03/31/20  2:48 PM   Specimen: Nasopharyngeal Swab; Nasopharyngeal(NP) swabs in vial transport medium  Result Value Ref Range Status   SARS Coronavirus 2 by RT PCR NEGATIVE NEGATIVE Final    Comment: (NOTE) SARS-CoV-2 target nucleic acids are NOT DETECTED.  The SARS-CoV-2 RNA is generally detectable in upper respiratory specimens during the acute phase of infection. The lowest concentration of SARS-CoV-2 viral copies this assay can detect is 138 copies/mL. A negative result does not preclude SARS-Cov-2 infection and should not be used as the sole basis for treatment or other patient  management decisions. A negative result may occur with  improper specimen collection/handling, submission of specimen other than nasopharyngeal swab, presence of viral mutation(s) within the areas targeted by this assay, and inadequate number of viral copies(<138 copies/mL). A negative result must be combined with clinical observations, patient history, and epidemiological information. The expected result is Negative.  Fact Sheet for Patients:  EntrepreneurPulse.com.au  Fact Sheet for Healthcare Providers:  IncredibleEmployment.be  This test is no t yet approved or cleared by the [place] FDA and  has been authorized for detection and/or diagnosis of SARS-CoV-2 by FDA under an Emergency Use Authorization (EUA). This EUA will remain  in effect (meaning this test can be used) for the duration of the COVID-19 declaration under Section 564(b)(1) of the Act, 21 U.S.C.section 360bbb-3(b)(1), unless the authorization is terminated  or revoked sooner.       Influenza A by PCR NEGATIVE NEGATIVE Final   Influenza B by PCR NEGATIVE NEGATIVE Final    Comment: (NOTE) The Xpert Xpress SARS-CoV-2/FLU/RSV plus assay is intended as an aid in the diagnosis of influenza from Nasopharyngeal swab specimens and should not be used as a sole basis for treatment. Nasal washings and aspirates are unacceptable for Xpert Xpress SARS-CoV-2/FLU/RSV testing.  Fact Sheet for Patients: EntrepreneurPulse.com.au  Fact Sheet for Healthcare Providers: IncredibleEmployment.be  This test is not yet approved or cleared by the [place] FDA and has been authorized for detection and/or diagnosis of SARS-CoV-2 by FDA under an Emergency Use Authorization (EUA). This EUA will remain in effect (meaning this test can be used) for the duration of the COVID-19 declaration under Section 564(b)(1) of the Act, 21 U.S.C. section 360bbb-3(b)(1),  unless the authorization is terminated or revoked.  Performed at [facility], [address], [place], [place] [address]   Urine Culture     Status: Abnormal   Collection Time: 03/31/20 10:27 PM   Specimen: In/Out Cath Urine  Result Value Ref Range Status   Specimen Description IN/OUT CATH URINE  Final   Special Requests   Final    NONE Performed at [facility], [place] [address], [place], [place] [address]    Culture MULTIPLE SPECIES PRESENT, SUGGEST RECOLLECTION (A)  Final   Report Status 04/02/2020 FINAL  Final  Urine culture     Status: Abnormal   Collection Time: 04/01/20  1:57 PM   Specimen: Urine, Random  Result Value Ref Range Status   Specimen Description URINE, RANDOM  Final   Special Requests   Final    LEFT KIDNEY Performed at [facility], [place] [address], [place], [place] [address]    Culture MULTIPLE SPECIES PRESENT, SUGGEST RECOLLECTION (A)  Final   Report Status 04/02/2020 FINAL  Final  Surgical pcr screen  Status: None   Collection Time: 04/13/20  4:48 AM   Specimen: Nasal Mucosa; Nasal Swab  Result Value Ref Range Status   MRSA, PCR NEGATIVE NEGATIVE Final   Staphylococcus aureus NEGATIVE NEGATIVE Final    Comment: (NOTE) The Xpert SA Assay (FDA approved for NASAL specimens in patients 48 years of age and older), is one component of a comprehensive surveillance program. It is not intended to diagnose infection nor to guide or monitor treatment. Performed at [facility], [place] [address] [place], [place] [address]     Labs: Results for orders placed or performed during the hospital encounter of 03/31/20 (from the past 48 hour(s))  Glucose, capillary     Status: None   Collection Time: 04/17/20  9:25 AM  Result Value Ref Range   Glucose-Capillary 85 70 - 99 mg/dL    Comment: Glucose reference range applies only to samples taken after fasting for at least 8 hours.  Glucose, capillary     Status: None   Collection Time: 04/17/20  12:51 PM  Result Value Ref Range   Glucose-Capillary 86 70 - 99 mg/dL    Comment: Glucose reference range applies only to samples taken after fasting for at least 8 hours.  Glucose, capillary     Status: None   Collection Time: 04/17/20  5:31 PM  Result Value Ref Range   Glucose-Capillary 89 70 - 99 mg/dL    Comment: Glucose reference range applies only to samples taken after fasting for at least 8 hours.  I-STAT, chem 8     Status: Abnormal   Collection Time: 04/18/20 12:05 PM  Result Value Ref Range   Sodium 136 135 - 145 mmol/L   Potassium 4.2 3.5 - 5.1 mmol/L   Chloride 105 98 - 111 mmol/L   BUN 29 (H) 6 - 20 mg/dL   Creatinine, Ser 4.80 (H) 0.61 - 1.24 mg/dL   Glucose, Bld 82 70 - 99 mg/dL    Comment: Glucose reference range applies only to samples taken after fasting for at least 8 hours.   Calcium, Ion 1.11 (L) 1.15 - 1.40 mmol/L   TCO2 23 22 - 32 mmol/L   Hemoglobin 11.2 (L) 13.0 - 17.0 g/dL   HCT 33.0 (L) 39.0 - 52.0 %  Prepare RBC (crossmatch)     Status: None   Collection Time: 04/18/20 12:52 PM  Result Value Ref Range   Order Confirmation      ORDER PROCESSED BY BLOOD BANK Performed at [facility], [place] [address], [place], [place] [address]   Type and screen     Status: None (Preliminary result)   Collection Time: 04/18/20  1:05 PM  Result Value Ref Range   ABO/RH(D) O POS    Antibody Screen NEG    Sample Expiration      04/21/2020,2359 Performed at [facility], [place] [address], [place], [place] [address]    Unit Number [id number]    Blood Component Type RED CELLS,LR    Unit division 00    Status of Unit ALLOCATED    Transfusion Status OK TO TRANSFUSE    Crossmatch Result Compatible    Unit Number [id number]    Blood Component Type RBC LR PHER2    Unit division 00    Status of Unit ALLOCATED    Transfusion Status OK TO TRANSFUSE    Crossmatch Result Compatible   Glucose, capillary     Status: None   Collection Time: 04/18/20 11:43  PM   Result Value Ref Range   Glucose-Capillary 94 70 - 99 mg/dL    Comment: Glucose reference range applies only to samples taken after fasting for at least 8 hours.  Basic metabolic panel     Status: Abnormal   Collection Time: 04/19/20 12:57 AM  Result Value Ref Range   Sodium 135 135 - 145 mmol/L   Potassium 4.2 3.5 - 5.1 mmol/L   Chloride 103 98 - 111 mmol/L   CO2 20 (L) 22 - 32 mmol/L   Glucose, Bld 141 (H) 70 - 99 mg/dL    Comment: Glucose reference range applies only to samples taken after fasting for at least 8 hours.   BUN 25 (H) 6 - 20 mg/dL   Creatinine, Ser 4.64 (H) 0.61 - 1.24 mg/dL   Calcium 8.2 (L) 8.9 - 10.3 mg/dL   GFR, Estimated 14 (L) >60 mL/min    Comment: (NOTE) Calculated using the CKD-EPI Creatinine Equation (2021)    Anion gap 12 5 - 15    Comment: Performed at [place] [address], [place], [place] [address]  CBC     Status: Abnormal   Collection Time: 04/19/20 12:57 AM  Result Value Ref Range   WBC 7.9 4.0 - 10.5 K/uL   RBC 3.00 (L) 4.22 - 5.81 MIL/uL   Hemoglobin 9.1 (L) 13.0 - 17.0 g/dL   HCT 28.9 (L) 39.0 - 52.0 %   MCV 96.3 80.0 - 100.0 fL   MCH 30.3 26.0 - 34.0 pg   MCHC 31.5 30.0 - 36.0 g/dL   RDW 15.8 (H) 11.5 - 15.5 %   Platelets 349 150 - 400 K/uL   nRBC 0.0 0.0 - 0.2 %    Comment: Performed at [facility], [place] [address] [place], [place] [address]  Glucose, capillary     Status: Abnormal   Collection Time: 04/19/20  4:20 AM  Result Value Ref Range   Glucose-Capillary 69 (L) 70 - 99 mg/dL    Comment: Glucose reference range applies only to samples taken after fasting for at least 8 hours.    Imaging / Studies: No results found.  Medications / Allergies: per chart  Antibiotics: Anti-infectives (From admission, onward)   Start     Dose/Rate Route Frequency Ordered Stop   04/19/20 0200  cefoTEtan (CEFOTAN) 2 g in sodium chloride 0.9 % 100 mL IVPB        2 g 200 mL/hr over 30 Minutes Intravenous Every 12 hours  04/18/20 1824 04/19/20 0238   04/18/20 1200  cefoTEtan (CEFOTAN) 2 g in sodium chloride 0.9 % 100 mL IVPB        2 g 200 mL/hr over 30 Minutes Intravenous To ShortStay Surgical 04/17/20 2206 04/18/20 1410   04/17/20 2215  neomycin (MYCIFRADIN) tablet 1,000 mg       "And" Linked Group Details   1,000 mg Oral  Once 04/17/20 2206 04/17/20 2348   04/17/20 2215  metroNIDAZOLE (FLAGYL) tablet 1,000 mg       "And" Linked Group Details   1,000 mg Oral  Once 04/17/20 2206 04/17/20 2336   04/13/20 1145  cefoTEtan (CEFOTAN) 2 g in sodium chloride 0.9 % 100 mL IVPB        2 g 200 mL/hr over 30 Minutes Intravenous On call to O.R. 04/12/20 2006 04/14/20 0559   04/12/20 2100  neomycin (MYCIFRADIN) tablet 1,000 mg       "And" Linked Group Details   1,000  mg Oral 3 times per day 04/12/20 2006 04/13/20 0527   04/12/20 2100  metroNIDAZOLE (FLAGYL) tablet 1,000 mg       "And" Linked Group Details   1,000 mg Oral 3 times per day 04/12/20 2006 04/13/20 0527   04/03/20 1600  ceFEPIme (MAXIPIME) 1 g in sodium chloride 0.9 % 100 mL IVPB        1 g 200 mL/hr over 30 Minutes Intravenous Every 24 hours 04/03/20 0918 04/07/20 1108   04/01/20 1600  ceFEPIme (MAXIPIME) 1 g in sodium chloride 0.9 % 100 mL IVPB  Status:  Discontinued        1 g 200 mL/hr over 30 Minutes Intravenous Every 24 hours 03/31/20 1550 04/03/20 0918   04/01/20 1145  cefTRIAXone (ROCEPHIN) 1 g in sodium chloride 0.9 % 100 mL IVPB        1 g 200 mL/hr over 30 Minutes Intravenous  Once 04/01/20 1057 04/01/20 1308   03/31/20 1415  ceFEPIme (MAXIPIME) 2 g in sodium chloride 0.9 % 100 mL IVPB        2 g 200 mL/hr over 30 Minutes Intravenous  Once 03/31/20 1407 03/31/20 1516        Note: Portions of this report may have been transcribed using voice recognition software. Every effort was made to ensure accuracy; however, inadvertent computerized transcription errors may be present.   Any transcriptional errors that result from this process are  unintentional.    Adin Hector, MD, FACS, MASCRS Gastrointestinal and Minimally Invasive Surgery  [facility] [address], [place], [place] [address] [phone] Fax [phone] Main/Paging  CONTACT INFORMATION: Weekday (9AM-5PM) concerns: Call [facility] main office at [phone] Weeknight (5PM-9AM) or Weekend/Holiday concerns: Check [email or web address] for General Surgery [facility] coverage (Please, do not use SecureChat as it is not reliable communication to operating surgeons for immediate patient care)      04/19/2020  7:13 AM

## 2020-04-19 NOTE — Progress Notes (Addendum)
[facility] PROGRESS NOTE  Chase [name] CVE:[id number] DOB: [birth year] DOA: 03/31/2020 PCP: [name]      Status: Remains inpatient appropriate because:Unsafe d/c plan and Inpatient level of care appropriate due to severity of illness   Dispo: The patient is from: Home              Anticipated d/c is to: SNF              Anticipated d/c date is: > 3 days              Patient currently is not medically stable to d/c. Barriers to discharge: Status post diverting loop colostomy as well as internal stent placement, PT recommending SNF at present we have not received any bed offers from [place] affiliated facilities   Code Status: Full code Family Communication: Patient; at length with son [name] on 1/19. DVT prophylaxis: SQ heparin Vaccination status: Did receive 1 dose of the Moderna COVID-vaccine.  Subsequently refusing any future doses or even a booster shot.  States he had COVID 3 months ago and has reduced immune system and does not feel these immunizations are effective for him.  Foley catheter: Resented with chronic Foley catheter, also has bilateral percutaneous nephrostomy tubes  HPI: HPI on 03/31/2020 by Dr. 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 Patient was reportedly seen by neighbors yesterday in a recliner and was then found on the floor today obtunded. He was sent to [facility] emergency department and is  unable to provide history due to his clinical condition.  Interim history Admitted with acute renal failure on chronic kidney disease, stage V secondary to urinary obstruction and dehydration with electrolyte abnormalities.  Nephrology consulted.  Patient did undergo hemodialysis.  IR placed bilateral percutaneous nephrostomy tubes.  Creatinine did improve with a couple cycles of hemodialysis however plateaued.  Hemodialysis catheter was then removed.  No further plans for hemodialysis at this time.  Nephrology feels that patient can be discharged home with outpatient follow-up.  At this time, discharge is complicated by patient being extremely weak and deconditioned.  Initially patient did not want to be discharged to SNF however this morning he states that he will consider it.   Subjective: Patient alert, bright and quite engaging in conversation today which is a significant improvement than baseline also eating sweet snacks that have been provided at the bedside.  Objective: Vitals:   04/19/20 0254 04/19/20 0639  BP: 127/80 108/60  Pulse: 78 72  Resp: 16 16  Temp: 98.7 F (37.1 C) 99.3 F (37.4 C)  SpO2: 99% 100%    Intake/Output Summary (Last 24 hours) at 04/19/2020 0810 Last data filed at 04/19/2020 0258 Gross per 24 hour  Intake 3235 ml  Output 1750 ml  Net 1485 ml   Filed Weights   04/16/20 0500 04/17/20 0511 04/18/20 0610  Weight: 53.1 kg 50.4 kg 52.4 kg    Exam: Constitutional: Cachectic in appearance and also appears chronically ill, no acute distress Respiratory: Bilateral lung sounds clear to auscultation anteriorly, no  increased work of breathing, stable on room Cardiovascular: Heart sounds S1-S2, pulse is regular, skin warm and dry and pink.  Adequate capillary refill noted Abdomen: Soft nontender nondistended although somewhat sore at incision sites.  Colostomy stoma pink without significant edema.  Brownish fluid in ostomy bag. Genitourinary: Bilateral  percutaneous nephrostomy tubes in place draining clear yellow urine to collection bag; also has chronic Foley catheter draining yellow urine to bedside bag Neurologic: CN 2-12 grossly intact. Sensation intact, DTR normal. Strength 5/5 x all 4 extremities.  Psychiatric: Normal judgment and insight. Alert and oriented x 3.  Flat mood and affect.    Assessment/Plan: Acute problems: Acute renal failure on chronic kidney disease, stage V -Nephrology consulted and appreciated -Creatinine 5.76 in October 2021, although patient presented with a creatinine of 16.69 -Chronic kidney disease 2/2 longstanding obstruction.  Acute kidney injury due to dehydration and sepsis along with ATN with obstruction. -Interventional radiology consulted for bilateral percutaneous nephrostomy tube placement, 04/01/2020 as well as temporary HD catheter placement. -Patient did undergo hemodialysis, 2 cycles with last hemodialysis on 04/04/2019. -Creatinine has now plateaued, currently 5.08 -Though creatinine is still elevated, it is below his baseline.  Patient himself is unsure about wanting continuous HD.  Nephrology recommended that patient follow-up with nephrology as an outpatient.  Recommended continuing bicarb on discharge.  Bilateral hydronephrosis s/p cystoscopy,B ureteral stent placement w/ B retrograde pyelogram -Per IR follow-up on 04/07/2019, patient had urinary obstruction secondary to pelvic mass with subsequent bilateral hydronephrosis in the setting of bilateral ureteral stents, bilateral PCNs inadvertently removed (unknown how long they have been out), status post bilateral PCN placement on 04/01/2020. -1/18 Dr Alinda Money performing concurrent procedure with surgical team (cystoscopy and bilateral ureteral stent change) -Presented w/ chronic Foley catheter as well. -Also has history of Hodgkin's lymphoma and is status postchemotherapy and radiation which may have also contributed to recurrent obstructive renal  disease  History of pelvic abscess/known colovesical fistula s/p lap sigmoid colectomy w/epigastric hernia repair (1/18) -Noted on 11/15/2019, status post left transgluteal drain by IR/colovesicular fistula -1/18 she will undergo diverting loop colostomy by [name] in conjunction with urological procedure by Dr. Alinda Money as described above -Primary GI is Dr. Rush Landmark. Dr Carlean Purl plans on preop colonoscopy 1/18 (today) -Cont scheduled Zofran for recurrent nausea  Goals of care -Palliative care consulted and appreciated -Patient full code and refused to discuss any decisions regarding plan of care -Patient should follow up with palliative care as an outpatient -Gust with patient's son [name].  Recommendation is to have eventual discussion with patient and if necessary family regarding instituting DNR status.  It is felt by the medical team that patient is so poorly conditioned that he would not tolerate a resuscitative event and likely would not survive.  Emphasis would be on DNR clarification and not comfort care since at this juncture patient appears to be improving slightly although long-term prognosis is not good.  Would like patient to understand that DNR does not mean do not treat  Severe physical deconditioning, history of prior CVA -July 2021, history of atrial fibrillation, patient is not a candidate for anticoagulation due to GI bleed and thrombocytopenia -Currently no focal deficits  Severe protein calorie malnutrition Nutrition Problem: Severe Malnutrition Etiology: chronic illness (colovesical fistula) Signs/Symptoms: moderate fat depletion,severe fat depletion,moderate muscle depletion,severe muscle depletion Interventions: MVI,Refer to RD note for recommendations Estimated body mass index is 18.65 kg/m as calculated from the following:   Height as of this encounter: 5\' 6"  (1.676 m).  Weight as of this encounter: 52.4 kg.  Chronic pain -Continue low-dose Percocet along with  Neurontin -Discussed with nephrology and pharmacy- continue low-dose Zanaflex   Other problems: Severe dehydration with multiple metabolic derangements as follows: Severe metabolic acidosis, hyperkalemia, hyperphosphatemia -Resolved after rehydration, dialysis and Kayexalate  Severe sepsis secondary to urinary tract infection -Present on admission-sepsis physiology has now resolved -Patient presented with encephalopathy as well as hypothermia and leukocytosis -Blood cultures were negative x2 -Urine culture from 12/31 and 1/1 showed multiple species -CT renal stone study 12/31: Bladder wall appeared slightly thickened.  No significant fluid collection in pelvis -Patient completed 7 days of IV cefepime  PTSD -Sedatives held  History of gastric bypass  -Avoid NSAIDs (unable to use currently in context of chronic kidney disease)  Data Reviewed: Basic Metabolic Panel: Recent Labs  Lab 04/17/20 0307 04/18/20 1205 04/19/20 0057  NA 137 136 135  K 4.6 4.2 4.2  CL 103 105 103  CO2 25  --  20*  GLUCOSE 95 82 141*  BUN 29* 29* 25*  CREATININE 4.97* 4.80* 4.64*  CALCIUM 8.3*  --  8.2*   Liver Function Tests: Recent Labs  Lab 04/17/20 0307  AST 33  ALT 26  ALKPHOS 122  BILITOT 0.5  PROT 6.4*  ALBUMIN 2.3*   No results for input(s): LIPASE, AMYLASE in the last 168 hours. No results for input(s): AMMONIA in the last 168 hours. CBC: Recent Labs  Lab 04/17/20 0307 04/18/20 1205 04/19/20 0057  WBC 6.8  --  7.9  HGB 9.4* 11.2* 9.1*  HCT 31.3* 33.0* 28.9*  MCV 97.5  --  96.3  PLT 341  --  349   Cardiac Enzymes: No results for input(s): CKTOTAL, CKMB, CKMBINDEX, TROPONINI in the last 168 hours. BNP (last 3 results) No results for input(s): BNP in the last 8760 hours.  ProBNP (last 3 results) No results for input(s): PROBNP in the last 8760 hours.  CBG: Recent Labs  Lab 04/17/20 1251 04/17/20 1731 04/18/20 2343 04/19/20 0420 04/19/20 0758  GLUCAP 86 89 94 69*  66*    Recent Results (from the past 240 hour(s))  Surgical pcr screen     Status: None   Collection Time: 04/13/20  4:48 AM   Specimen: Nasal Mucosa; Nasal Swab  Result Value Ref Range Status   MRSA, PCR NEGATIVE NEGATIVE Final   Staphylococcus aureus NEGATIVE NEGATIVE Final    Comment: (NOTE) The Xpert SA Assay (FDA approved for NASAL specimens in patients 20 years of age and older), is one component of a comprehensive surveillance program. It is not intended to diagnose infection nor to guide or monitor treatment. Performed at [facility], [place] [address] [place], [place] [address]      Studies: DG Cystogram  Result Date: 04/19/2020 CLINICAL DATA:  Bilateral stent placement EXAM: NEPHROSTOMY TUBE INJECTION AND BILATERAL STENT PLACEMENT COMPARISON:  CT scan 03/31/2020 FINDINGS: The first 2 images depict injection of a left nephrostomy tube, showing filling defects in the right renal collecting system which could be gas bubbles or blood clots. The proximal loop of the left double-J ureteral stent appears to be in the proximal ureter on these images. There mobile filling defects in the ureter, probably gas bubbles. The image at 4:16 p.m. shows a double-J ureteral stent with proximal loop now in the collecting system. The image at 4:18 p.m. demonstrates injection of the right nephrostomy tube showing a dilated right collecting system extending down to the level of the  UPJ. The double-J ureteral stent proximal margin appears to be in the proximal ureter on this image, but by 4:22 p.m. the double-J ureteral stent proximal loop is in the dilated collecting system. The final image at 4:23 p.m. demonstrates the distal ends of the stents both in the urinary bladder which demonstrates dilute contrast. IMPRESSION: 1. Positioning of the double-J ureteral stents with proximal loops in the renal pelvis bilaterally. Electronically Signed   By: Van Clines M.D.   On: 04/19/2020 08:07     Scheduled Meds: . sodium chloride   Intravenous Once  . acetaminophen  500 mg Oral Q6H  . alvimopan  12 mg Oral BID  . calcium carbonate  1 tablet Oral TID  . Chlorhexidine Gluconate Cloth  6 each Topical Daily  . DULoxetine  60 mg Oral Daily   And  . DULoxetine  30 mg Oral QHS  . feeding supplement  237 mL Oral BID BM  . heparin  5,000 Units Subcutaneous Q8H  . lip balm  1 application Topical BID  . multivitamin with minerals  1 tablet Oral BID  . ondansetron  4 mg Oral Q6H   Or  . ondansetron (ZOFRAN) IV  4 mg Intravenous Q6H  . pantoprazole  40 mg Oral QHS  . polycarbophil  625 mg Oral BID  . sodium bicarbonate  1,300 mg Oral TID  . sodium chloride flush  3 mL Intravenous Q12H  . sodium chloride flush  5 mL Intracatheter Q8H  . tamsulosin  0.4 mg Oral QHS   Continuous Infusions: . sodium chloride    . lactated ringers      Principal Problem:   Sepsis with acute renal failure without septic shock (HCC) Active Problems:   CKD (chronic kidney disease), stage IV (HCC)   Chronic pain   Protein-calorie malnutrition, severe   Pelvic abscess with colovescial fistula   Acute kidney failure (HCC)   History of COVID-19   History of gastric bypass   Hodgkin lymphoma, unspecified, unspecified site (HCC)   Metabolic acidosis   Toxic metabolic encephalopathy   PTSD (post-traumatic stress disorder)   Bilateral ureteral obstruction s/p perc nephrosotmy & stenting   History of CVA (cerebrovascular accident)   Incisional hernia - epigastric   DNR (do not resuscitate) discussion   Palliative care by specialist   Adult failure to thrive   Acute respiratory failure with hypoxia ([place])   Diverticulitis of sigmoid colon with abscess s/p colectomy/colostomy Jeanette Caprice) 04/18/2020   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[facility])   Consultants:    Procedures:    Antibiotics: Anti-infectives (From admission, onward)   Start     Dose/Rate Route Frequency Ordered Stop   04/19/20 0200  cefoTEtan  (CEFOTAN) 2 g in sodium chloride 0.9 % 100 mL IVPB        2 g 200 mL/hr over 30 Minutes Intravenous Every 12 hours 04/18/20 1824 04/19/20 0238   04/18/20 1200  cefoTEtan (CEFOTAN) 2 g in sodium chloride 0.9 % 100 mL IVPB        2 g 200 mL/hr over 30 Minutes Intravenous To ShortStay Surgical 04/17/20 2206 04/18/20 1410   04/17/20 2215  neomycin (MYCIFRADIN) tablet 1,000 mg       "And" Linked Group Details   1,000 mg Oral  Once 04/17/20 2206 04/17/20 2348   04/17/20 2215  metroNIDAZOLE (FLAGYL) tablet 1,000 mg       "And" Linked Group Details   1,000 mg Oral  Once 04/17/20 2206 04/17/20 2336   04/13/20  1145  cefoTEtan (CEFOTAN) 2 g in sodium chloride 0.9 % 100 mL IVPB        2 g 200 mL/hr over 30 Minutes Intravenous On call to O.R. 04/12/20 2006 04/14/20 0559   04/12/20 2100  neomycin (MYCIFRADIN) tablet 1,000 mg       "And" Linked Group Details   1,000 mg Oral 3 times per day 04/12/20 2006 04/13/20 0527   04/12/20 2100  metroNIDAZOLE (FLAGYL) tablet 1,000 mg       "And" Linked Group Details   1,000 mg Oral 3 times per day 04/12/20 2006 04/13/20 0527   04/03/20 1600  ceFEPIme (MAXIPIME) 1 g in sodium chloride 0.9 % 100 mL IVPB        1 g 200 mL/hr over 30 Minutes Intravenous Every 24 hours 04/03/20 0918 04/07/20 1108   04/01/20 1600  ceFEPIme (MAXIPIME) 1 g in sodium chloride 0.9 % 100 mL IVPB  Status:  Discontinued        1 g 200 mL/hr over 30 Minutes Intravenous Every 24 hours 03/31/20 1550 04/03/20 0918   04/01/20 1145  cefTRIAXone (ROCEPHIN) 1 g in sodium chloride 0.9 % 100 mL IVPB        1 g 200 mL/hr over 30 Minutes Intravenous  Once 04/01/20 1057 04/01/20 1308   03/31/20 1415  ceFEPIme (MAXIPIME) 2 g in sodium chloride 0.9 % 100 mL IVPB        2 g 200 mL/hr over 30 Minutes Intravenous  Once 03/31/20 1407 03/31/20 1516       Time spent:  20 minutes    Erin Hearing ANP  [facility] 7 am - 330 pm/M-F for direct patient care and secure chat Please refer to Amion  for contact info 19  days    Addendum: Patient seen and examined. Agree with above plan as outlined by Ms. Lissa Merlin.     Estill Cotta M.D.  [facility] 04/19/2020, 2:12 PM

## 2020-04-19 NOTE — Plan of Care (Signed)

## 2020-04-20 ENCOUNTER — Inpatient Hospital Stay (HOSPITAL_COMMUNITY): Payer: No Typology Code available for payment source

## 2020-04-20 ENCOUNTER — Encounter: Payer: No Typology Code available for payment source | Admitting: Gastroenterology

## 2020-04-20 DIAGNOSIS — R64 Cachexia: Secondary | ICD-10-CM

## 2020-04-20 DIAGNOSIS — N185 Chronic kidney disease, stage 5: Secondary | ICD-10-CM | POA: Diagnosis not present

## 2020-04-20 DIAGNOSIS — J9601 Acute respiratory failure with hypoxia: Secondary | ICD-10-CM | POA: Diagnosis not present

## 2020-04-20 DIAGNOSIS — G8929 Other chronic pain: Secondary | ICD-10-CM | POA: Diagnosis not present

## 2020-04-20 DIAGNOSIS — Z933 Colostomy status: Secondary | ICD-10-CM | POA: Diagnosis not present

## 2020-04-20 DIAGNOSIS — N179 Acute kidney failure, unspecified: Secondary | ICD-10-CM | POA: Diagnosis not present

## 2020-04-20 DIAGNOSIS — N321 Vesicointestinal fistula: Secondary | ICD-10-CM | POA: Diagnosis not present

## 2020-04-20 LAB — SURGICAL PATHOLOGY

## 2020-04-20 LAB — GLUCOSE, CAPILLARY
Glucose-Capillary: 80 mg/dL (ref 70–99)
Glucose-Capillary: 115 mg/dL — ABNORMAL HIGH (ref 70–99)
Glucose-Capillary: 139 mg/dL — ABNORMAL HIGH (ref 70–99)
Glucose-Capillary: 165 mg/dL — ABNORMAL HIGH (ref 70–99)
Glucose-Capillary: 125 mg/dL — ABNORMAL HIGH (ref 70–99)

## 2020-04-20 MED ORDER — HYDROMORPHONE HCL 1 MG/ML IJ SOLN
0.5000 mg | INTRAMUSCULAR | Status: DC | PRN
  Administered 2020-04-20 – 2020-04-23 (×9): 1 mg via INTRAVENOUS
  Filled 2020-04-20 (×9): qty 1

## 2020-04-20 MED ORDER — COSYNTROPIN 0.25 MG IJ SOLR
0.2500 mg | Freq: Once | INTRAMUSCULAR | Status: AC
  Administered 2020-04-21: 0.25 mg via INTRAVENOUS
  Filled 2020-04-20: qty 0.25

## 2020-04-20 MED ORDER — VITAMIN D 25 MCG (1000 UNIT) PO TABS
1000.0000 [IU] | ORAL_TABLET | Freq: Every day | ORAL | Status: DC
  Administered 2020-04-20 – 2020-05-03 (×14): 1000 [IU] via ORAL
  Filled 2020-04-20 (×14): qty 1

## 2020-04-20 MED ORDER — GABAPENTIN 100 MG PO CAPS: 100.0000 mg | ORAL_CAPSULE | Freq: Three times a day (TID) | ORAL | Status: DC

## 2020-04-20 MED ORDER — OXYCODONE HCL 5 MG PO TABS
5.0000 mg | ORAL_TABLET | ORAL | Status: DC | PRN
  Administered 2020-04-20 (×3): 10 mg via ORAL
  Administered 2020-04-20: 5 mg via ORAL
  Administered 2020-04-21 – 2020-05-03 (×58): 10 mg via ORAL
  Filled 2020-04-20 (×10): qty 2
  Filled 2020-04-20: qty 1
  Filled 2020-04-20 (×37): qty 2
  Filled 2020-04-20: qty 1
  Filled 2020-04-20 (×14): qty 2

## 2020-04-20 MED ORDER — TIZANIDINE HCL 2 MG PO TABS
2.0000 mg | ORAL_TABLET | Freq: Three times a day (TID) | ORAL | Status: DC | PRN
  Administered 2020-04-20 – 2020-04-22 (×4): 2 mg via ORAL
  Filled 2020-04-20 (×4): qty 1

## 2020-04-20 MED ORDER — LACTATED RINGERS IV BOLUS
1000.0000 mL | Freq: Once | INTRAVENOUS | Status: AC
  Administered 2020-04-20: 1000 mL via INTRAVENOUS

## 2020-04-20 MED ORDER — HYDROXYZINE HCL 10 MG PO TABS
10.0000 mg | ORAL_TABLET | Freq: Three times a day (TID) | ORAL | Status: DC | PRN
  Filled 2020-04-20: qty 1

## 2020-04-20 MED ORDER — GABAPENTIN 300 MG PO CAPS
300.0000 mg | ORAL_CAPSULE | Freq: Every day | ORAL | Status: DC
  Administered 2020-04-20 – 2020-05-02 (×13): 300 mg via ORAL
  Filled 2020-04-20 (×13): qty 1

## 2020-04-20 MED ORDER — GABAPENTIN 400 MG PO CAPS: 400.0000 mg | ORAL_CAPSULE | Freq: Three times a day (TID) | ORAL | Status: DC

## 2020-04-20 MED ORDER — TIZANIDINE HCL 2 MG PO TABS
2.0000 mg | ORAL_TABLET | Freq: Two times a day (BID) | ORAL | Status: DC
  Administered 2020-04-20: 2 mg via ORAL
  Filled 2020-04-20: qty 1

## 2020-04-20 MED ORDER — LACTATED RINGERS IV BOLUS
1000.0000 mL | Freq: Three times a day (TID) | INTRAVENOUS | Status: AC | PRN
  Administered 2020-04-21: 1000 mL via INTRAVENOUS

## 2020-04-20 NOTE — Progress Notes (Addendum)
Chase [name] [id number] 02-Oct-1965  CARE TEAM:  PCP: Clinic, [place] Team: Patient Care Team: [facility], [name] as PCP - General Raynelle Bring, MD as Consulting Physician (Urology) Michael Boston, MD as Consulting Physician (General Surgery) Kandace Blitz, MD as Referring Physician (Pain Medicine) Mignon Pine, DO as Consulting Physician (Infectious Diseases) Garvin Fila, MD as Consulting Physician (Neurology) Gatha Mayer, MD as Consulting Physician (Gastroenterology) Joylene Grapes Shaune Pollack, MD as Consulting Physician (Nephrology)  Inpatient Treatment Team: Treatment Team: Attending Provider: Mendel Corning, MD; Consulting Physician: Reesa Chew, MD; Technician: Rica Koyanagi, NT; Consulting Physician: Michael Boston, MD; Consulting Physician: Raynelle Bring, MD; [place] Nurse: Meryle Ready, RN; [place] Nurse: Nadara Mode, RN; Rounding Team: Jacquelyne Balint Team, MD; Nurse Practitioner: Samella Parr, NP; Case Manager: Arna Snipe, RN; Consulting Physician: Nolon Nations, MD; [place] Nurse: Jeannie Fend, FNP; Technician: Gasper Sells, NT; Rounding Team: Ivonne Andrew, MD; Student Nurse: Vennie Homans, [place]; Registered Nurse: Donzetta Matters, RN; Registered Nurse: Gaspar Bidding, RN; Occupational Therapist: Randon Goldsmith, OT; Social Worker: Emeterio Reeve, LCSWA   Problem List:   Active Problems:   CKD (chronic kidney disease) stage 5, GFR less than 15 ml/min (HCC)   Acute kidney failure ([place])   Protein-calorie malnutrition, severe   Pelvic abscess with colovescial fistula   History of gastric bypass   Bilateral ureteral obstruction s/p perc nephrosotmy & stenting   History of CVA (cerebrovascular accident)   Chronic pain   History of COVID-19   Hodgkin lymphoma, unspecified, unspecified site ([place])   Metabolic acidosis   Toxic metabolic encephalopathy   PTSD (post-traumatic  stress disorder)   Incisional hernia - epigastric   Sepsis with acute renal failure without septic shock ([place])   DNR (do not resuscitate) discussion   Palliative care by specialist   Adult failure to thrive   Acute respiratory failure with hypoxia ([place])   Diverticulitis of sigmoid colon with abscess s/p colectomy/colostomy Jeanette Caprice) 04/18/2020   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[facility])   Personality disorder    2 Days Post-Op  04/18/2020  COLORECTAL POST-OPERATIVE DIAGNOSIS:   SIGMOID DIVERTICULITIS EPIGASTRIC INCISIONAL HERNIA  PROCEDURE:   LAPAROSCOPIC SIGMOID COLOTOMY WITH COLOSTOMY ([place]) INCISIONAL EPIGASTRIC HERNIA REPAIR - PRIMARY SUTURE  SURGEON:  Adin Hector, MD  UROLOGY Postoperative diagnosis:  1. Bilateral ureteral obstruction   Procedure:  1. Cystoscopy 2. Bilateral ureteral stent replacement (RIGHT 6Fr x 26cm, LEFT 6Fr x 28cm) 3. Bilateral retrograde pyelogram  Surgeon: Louis Meckel, MD  Assessment  [facility] Stay = 20 days)  Plan:  -ERAS protocol  -adv diet - try soft  -mobilize.  Patient is trying to work with physical therapy more.  Nursing and nurse tech in room.  He noted he we will try and keep work with him.  -Concern for orthostasis.  IV fluid bolus.  Check orthostatic vital signs regularly.  Holding most of his blood pressure medicines.  Can hold even his Flomax as that may encourage orthostasis as well.  Ultimately defer to medicine.    -Daily patient having issues with glucocorticoid and mineralocorticoid insufficiency contributing to this?  Check PRA/cortisol.  Defer to medicine/nephrology/Urology for other workup  Chronic anemia without any postoperative major fall.  He does not seem to be iron deficient.  We will hold off on any more IV iron.  He received that last hospitalization.  -IVF per Urology/primary service (typically medlock POD#1 from colon  standpoint)  -f/u pathology -colon shows fibrosis but no active perforation  nor malignancy.  Benign.  -colostomy care/teaching.  Patient resistant to learning and wishes it to be managed by his mother and [place] system.  Wound ostomy team skeptical that this will succeed.  I am skeptical as well.  See if we can convince him to do what we recommend and not fight on this  -perc nephrostomy/ureteral stents/foley per Urology = Dr Alinda Money.  Hopefully can get nephrostomy tubes out if stents working better.    -personality disorder with intermittent medical AMA/noncompliance.  Probable PTSD and chronic depression given estranged sons and loss of life and daughter - per primary service.  ?SSRI?  -VTE prophylaxis- SCDs, etc  -mobilize as tolerated to help recovery  Disposition:  Disposition:  The patient is from: [place] discharge to:  [place]  Anticipated Date of Discharge is:  October 23, 2020  Barriers to discharge:  Pending Clinical improvement (more likely than not  Patient currently is NOT MEDICALLY STABLE for discharge from the hospital from a surgery standpoint.   30 minutes spent in review, evaluation, examination, counseling, and coordination of care.  More than 50% of that time was spent in counseling.  04/20/2020    Subjective: (Chief complaint)  No major events  Sore but pain controlled  Nurses in room  Tol Dys1/full liquid diet.  No nausea.  Wants to try solid food  Objective:  Vital signs:  Vitals:   04/19/20 1401 04/19/20 2005 04/20/20 0410 04/20/20 0500  BP: 94/72 129/86 116/76   Pulse: 80 75 81   Resp: 14 20 20    Temp: 97.8 F (36.6 C) 98.6 F (37 C) 98.7 F (37.1 C)   TempSrc: Oral Oral Oral   SpO2: 97% 100% 98%   Weight:    55.6 kg  Height:       Body mass index is 19.78 kg/m.   Last BM Date: 04/18/20  Intake/Output   Yesterday:  01/19 0701 - 01/20 0700 In: 490 [P.O.:480; I.V.:10] Out: 1600 [Urine:1600] This shift:  No intake/output data recorded.  Bowel  function:  Flatus: YES  BM:  No  Drains:  No more pelvic drain.   R nephrostomy tube w minimal output L nephrostomy tube w clear yellow urine Foley with clear yellow urine    Physical Exam:  General: Pt awake/alert in no acute distress.  Tired but not toxic.  Remains very cachectic Eyes: PERRL, normal EOM.  Sclera clear.  No icterus Neuro: CN II-XII intact w/o focal sensory/motor deficits. Lymph: No head/neck/groin lymphadenopathy Psych:  No delerium/psychosis/paranoia.  Oriented x 4 HENT: Normocephalic, Mucus membranes moist.  No thrush Neck: Supple, No tracheal deviation.  No obvious thyromegaly Chest: No pain to chest wall compression.  Good respiratory excursion.  No audible wheezing CV:  Pulses intact.  Regular rhythm.  No major extremity edema MS: Normal AROM mjr joints.  No obvious deformity  Abdomen: Soft.  Nondistended.  Mildly tender at incisions only.  Colostomy purple w edema but viable.  No evidence of peritonitis.  No incarcerated hernias.  GU: foley in place.  No hernias Ext:  No deformity.  No mjr edema.  No cyanosis Skin: No petechiae / purpurea.  No major sores.  Warm and dry    Results:   Cultures: Recent Results (from the past 720 hour(s))  Urine culture     Status: Abnormal   Collection Time: 03/31/20  2:07 PM   Specimen: In/Out Cath  Urine  Result Value Ref Range Status   Specimen Description   Final    IN/OUT CATH URINE Performed at [facility], [address] [place], [place] [address]    Special Requests   Final    NONE Performed at [facility], [address], [place], [place] [address]    Culture MULTIPLE SPECIES PRESENT, SUGGEST RECOLLECTION (A)  Final   Report Status 04/02/2020 FINAL  Final  Blood Culture (routine x 2)     Status: None   Collection Time: 03/31/20  2:20 PM   Specimen: BLOOD  Result Value Ref Range Status   Specimen Description BLOOD BLOOD LEFT FOREARM  Final   Special Requests   Final    BOTTLES DRAWN AEROBIC AND  ANAEROBIC Blood Culture adequate volume   Culture   Final    NO GROWTH 5 DAYS Performed at [facility], [address] [place], [place] [address]    Report Status 04/05/2020 FINAL  Final  Blood Culture (routine x 2)     Status: None   Collection Time: 03/31/20  2:20 PM   Specimen: BLOOD  Result Value Ref Range Status   Specimen Description BLOOD LEFT ANTECUBITAL  Final   Special Requests   Final    BOTTLES DRAWN AEROBIC AND ANAEROBIC Blood Culture adequate volume   Culture   Final    NO GROWTH 5 DAYS Performed at [facility], [address], [place], [place] [address]    Report Status 04/05/2020 FINAL  Final  Resp Panel by RT-PCR (Flu A&B, Covid) Nasopharyngeal Swab     Status: None   Collection Time: 03/31/20  2:48 PM   Specimen: Nasopharyngeal Swab; Nasopharyngeal(NP) swabs in vial transport medium  Result Value Ref Range Status   SARS Coronavirus 2 by RT PCR NEGATIVE NEGATIVE Final    Comment: (NOTE) SARS-CoV-2 target nucleic acids are NOT DETECTED.  The SARS-CoV-2 RNA is generally detectable in upper respiratory specimens during the acute phase of infection. The lowest concentration of SARS-CoV-2 viral copies this assay can detect is 138 copies/mL. A negative result does not preclude SARS-Cov-2 infection and should not be used as the sole basis for treatment or other patient management decisions. A negative result may occur with  improper specimen collection/handling, submission of specimen other than nasopharyngeal swab, presence of viral mutation(s) within the areas targeted by this assay, and inadequate number of viral copies(<138 copies/mL). A negative result must be combined with clinical observations, patient history, and epidemiological information. The expected result is Negative.  Fact Sheet for Patients:  EntrepreneurPulse.com.au  Fact Sheet for Healthcare Providers:  IncredibleEmployment.be  This test is no t yet approved  or cleared by the [place] FDA and  has been authorized for detection and/or diagnosis of SARS-CoV-2 by FDA under an Emergency Use Authorization (EUA). This EUA will remain  in effect (meaning this test can be used) for the duration of the COVID-19 declaration under Section 564(b)(1) of the Act, 21 U.S.C.section 360bbb-3(b)(1), unless the authorization is terminated  or revoked sooner.       Influenza A by PCR NEGATIVE NEGATIVE Final   Influenza B by PCR NEGATIVE NEGATIVE Final    Comment: (NOTE) The Xpert Xpress SARS-CoV-2/FLU/RSV plus assay is intended as an aid in the diagnosis of influenza from Nasopharyngeal swab specimens and should not be used as a sole basis for treatment. Nasal washings and aspirates are unacceptable for Xpert Xpress SARS-CoV-2/FLU/RSV testing.  Fact Sheet for Patients: EntrepreneurPulse.com.au  Fact Sheet for Healthcare Providers:  IncredibleEmployment.be  This test is not yet approved or cleared by the [place] and has been authorized for detection and/or diagnosis of SARS-CoV-2 by FDA under an Emergency Use Authorization (EUA). This EUA will remain in effect (meaning this test can be used) for the duration of the COVID-19 declaration under Section 564(b)(1) of the Act, 21 U.S.C. section 360bbb-3(b)(1), unless the authorization is terminated or revoked.  Performed at [facility], [address] [place], [place] [address]   Urine Culture     Status: Abnormal   Collection Time: 03/31/20 10:27 PM   Specimen: In/Out Cath Urine  Result Value Ref Range Status   Specimen Description IN/OUT CATH URINE  Final   Special Requests   Final    NONE Performed at [facility], [place] [address], [place], [place] [address]    Culture MULTIPLE SPECIES PRESENT, SUGGEST RECOLLECTION (A)  Final   Report Status 04/02/2020 FINAL  Final  Urine culture     Status: Abnormal   Collection Time: 04/01/20  1:57 PM    Specimen: Urine, Random  Result Value Ref Range Status   Specimen Description URINE, RANDOM  Final   Special Requests   Final    LEFT KIDNEY Performed at [facility], [place] [address] [employer] [place], [place] [address]    Culture MULTIPLE SPECIES PRESENT, SUGGEST RECOLLECTION (A)  Final   Report Status 04/02/2020 FINAL  Final  Surgical pcr screen     Status: None   Collection Time: 04/13/20  4:48 AM   Specimen: Nasal Mucosa; Nasal Swab  Result Value Ref Range Status   MRSA, PCR NEGATIVE NEGATIVE Final   Staphylococcus aureus NEGATIVE NEGATIVE Final    Comment: (NOTE) The Xpert SA Assay (FDA approved for NASAL specimens in patients 16 years of age and older), is one component of a comprehensive surveillance program. It is not intended to diagnose infection nor to guide or monitor treatment. Performed at [facility], [place] [address] [place], [place] [address]     Labs: Results for orders placed or performed during the hospital encounter of 03/31/20 (from the past 48 hour(s))  I-STAT, chem 8     Status: Abnormal   Collection Time: 04/18/20 12:05 PM  Result Value Ref Range   Sodium 136 135 - 145 mmol/L   Potassium 4.2 3.5 - 5.1 mmol/L   Chloride 105 98 - 111 mmol/L   BUN 29 (H) 6 - 20 mg/dL   Creatinine, Ser 4.80 (H) 0.61 - 1.24 mg/dL   Glucose, Bld 82 70 - 99 mg/dL    Comment: Glucose reference range applies only to samples taken after fasting for at least 8 hours.   Calcium, Ion 1.11 (L) 1.15 - 1.40 mmol/L   TCO2 23 22 - 32 mmol/L   Hemoglobin 11.2 (L) 13.0 - 17.0 g/dL   HCT 33.0 (L) 39.0 - 52.0 %  Prepare RBC (crossmatch)     Status: None   Collection Time: 04/18/20 12:52 PM  Result Value Ref Range   Order Confirmation      ORDER PROCESSED BY BLOOD BANK Performed at [facility], [place] [address] [place], [place] [address]   Type and screen     Status: None (Preliminary result)   Collection Time: 04/18/20  1:05 PM  Result Value Ref Range   ABO/RH(D) O POS     Antibody Screen NEG    Sample Expiration      04/21/2020,2359 Performed at [facility], [address] [place], [place] [address]    Unit Number [id number]    Blood Component Type RED CELLS,LR    Unit division 00    Status of Unit ALLOCATED    Transfusion Status OK TO TRANSFUSE    Crossmatch Result Compatible    Unit Number [id number]    Blood Component Type RBC LR PHER2    Unit division 00    Status of Unit ALLOCATED    Transfusion Status OK TO TRANSFUSE    Crossmatch Result Compatible   Surgical pathology     Status: None   Collection Time: 04/18/20  3:08 PM  Result Value Ref Range   SURGICAL PATHOLOGY      SURGICAL PATHOLOGY CASE: [id number] PATIENT: [place] [facility] Pathology Report     Clinical History: Colo vesical fistula (ms)   FINAL MICROSCOPIC DIAGNOSIS:  A. COLON, SIGMOID, RESECTION: -  Segment of colon with fibrous serosal adhesions -  Three benign lymph nodes (0/3) -  Viable margins -  No malignancy identified  Jolyssa Oplinger DESCRIPTION:  Specimen: Colon, clinically sigmoid, suture at proximal margin, received fresh. Length: 44 cm Serosa: Pink to hyperemic with scattered pink-red to dark red adhesions. Serosa appears to be intact.  There is a small amount of attached soft mesentery. Contents: Soft light brown material Mucosa/Wall: The mucosa is tan-pink to pink-red, smooth, [place] with widely spaced intestinal folds.  The wall is up to 0.4 cm thick.  There are no mass lesions, and no grossly identifiable diverticula. Lymph nodes: 3 possible lymph nodes are sampled from the fat. Block Summary: Block 1 = proximal margin Block 2 = distal margin B locks 3, 4 = random sections including areas with serosal adhesions Block 5 = 3 possible nodes  SW 04/19/2020    Final Diagnosis performed by Thressa Sheller, MD.   Electronically signed 04/20/2020 Technical and / or Professional components performed at [facility]. [facility],  [place] [address] [place], [place] [address].  Immunohistochemistry Technical component (if applicable) was performed at [facility]. [address], [place], [place], [place] [address].   IMMUNOHISTOCHEMISTRY DISCLAIMER (if applicable): Some of these immunohistochemical stains may have been developed and the performance characteristics determine by [facility]. Some may not have been cleared or approved by the U.S. Food and Drug Administration. The FDA has determined that such clearance or approval is not necessary. This test is used for clinical purposes. It should not be regarded as investigational or for research. This laboratory is certified under the [place] [phone]) as qualified to perform high complexity clinical laboratory testing.  The controls stained appropriately.   Glucose, capillary     Status: None   Collection Time: 04/18/20 11:43 PM  Result Value Ref Range   Glucose-Capillary 94 70 - 99 mg/dL    Comment: Glucose reference range applies only to samples taken after fasting for at least 8 hours.  Basic metabolic panel     Status: Abnormal   Collection Time: 04/19/20 12:57 AM  Result Value Ref Range   Sodium 135 135 - 145 mmol/L   Potassium 4.2 3.5 - 5.1 mmol/L   Chloride 103 98 - 111 mmol/L   CO2 20 (L) 22 - 32 mmol/L   Glucose, Bld 141 (H) 70 - 99 mg/dL    Comment: Glucose reference range applies only to samples taken after fasting for at least 8 hours.   BUN 25 (H) 6 - 20 mg/dL  Creatinine, Ser 4.64 (H) 0.61 - 1.24 mg/dL   Calcium 8.2 (L) 8.9 - 10.3 mg/dL   GFR, Estimated 14 (L) >60 mL/min    Comment: (NOTE) Calculated using the CKD-EPI Creatinine Equation (2021)    Anion gap 12 5 - 15    Comment: Performed at [place] [address] [place], [place] [address]  CBC     Status: Abnormal   Collection Time: 04/19/20 12:57 AM  Result Value Ref Range   WBC 7.9 4.0 - 10.5 K/uL    RBC 3.00 (L) 4.22 - 5.81 MIL/uL   Hemoglobin 9.1 (L) 13.0 - 17.0 g/dL   HCT 28.9 (L) 39.0 - 52.0 %   MCV 96.3 80.0 - 100.0 fL   MCH 30.3 26.0 - 34.0 pg   MCHC 31.5 30.0 - 36.0 g/dL   RDW 15.8 (H) 11.5 - 15.5 %   Platelets 349 150 - 400 K/uL   nRBC 0.0 0.0 - 0.2 %    Comment: Performed at [facility], [place] [address] [place], [place] [address]  Glucose, capillary     Status: Abnormal   Collection Time: 04/19/20  4:20 AM  Result Value Ref Range   Glucose-Capillary 69 (L) 70 - 99 mg/dL    Comment: Glucose reference range applies only to samples taken after fasting for at least 8 hours.  Glucose, capillary     Status: Abnormal   Collection Time: 04/19/20  7:58 AM  Result Value Ref Range   Glucose-Capillary 66 (L) 70 - 99 mg/dL    Comment: Glucose reference range applies only to samples taken after fasting for at least 8 hours.  Glucose, capillary     Status: Abnormal   Collection Time: 04/19/20  8:42 AM  Result Value Ref Range   Glucose-Capillary 108 (H) 70 - 99 mg/dL    Comment: Glucose reference range applies only to samples taken after fasting for at least 8 hours.  Glucose, capillary     Status: None   Collection Time: 04/19/20 12:01 PM  Result Value Ref Range   Glucose-Capillary 76 70 - 99 mg/dL    Comment: Glucose reference range applies only to samples taken after fasting for at least 8 hours.  Glucose, capillary     Status: None   Collection Time: 04/19/20  4:14 PM  Result Value Ref Range   Glucose-Capillary 86 70 - 99 mg/dL    Comment: Glucose reference range applies only to samples taken after fasting for at least 8 hours.  Glucose, capillary     Status: Abnormal   Collection Time: 04/19/20  8:15 PM  Result Value Ref Range   Glucose-Capillary 115 (H) 70 - 99 mg/dL    Comment: Glucose reference range applies only to samples taken after fasting for at least 8 hours.  Glucose, capillary     Status: None   Collection Time: 04/20/20  4:17 AM  Result Value Ref Range    Glucose-Capillary 80 70 - 99 mg/dL    Comment: Glucose reference range applies only to samples taken after fasting for at least 8 hours.  Glucose, capillary     Status: Abnormal   Collection Time: 04/20/20  7:18 AM  Result Value Ref Range   Glucose-Capillary 115 (H) 70 - 99 mg/dL    Comment: Glucose reference range applies only to samples taken after fasting for at least 8 hours.    Imaging / Studies: DG Cystogram  Result Date: 04/19/2020 CLINICAL DATA:  Bilateral stent placement  EXAM: NEPHROSTOMY TUBE INJECTION AND BILATERAL STENT PLACEMENT COMPARISON:  CT scan 03/31/2020 FINDINGS: The first 2 images depict injection of a left nephrostomy tube, showing filling defects in the right renal collecting system which could be gas bubbles or blood clots. The proximal loop of the left double-J ureteral stent appears to be in the proximal ureter on these images. There mobile filling defects in the ureter, probably gas bubbles. The image at 4:16 p.m. shows a double-J ureteral stent with proximal loop now in the collecting system. The image at 4:18 p.m. demonstrates injection of the right nephrostomy tube showing a dilated right collecting system extending down to the level of the UPJ. The double-J ureteral stent proximal margin appears to be in the proximal ureter on this image, but by 4:22 p.m. the double-J ureteral stent proximal loop is in the dilated collecting system. The final image at 4:23 p.m. demonstrates the distal ends of the stents both in the urinary bladder which demonstrates dilute contrast. IMPRESSION: 1. Positioning of the double-J ureteral stents with proximal loops in the renal pelvis bilaterally. Electronically Signed   By: Van Clines M.D.   On: 04/19/2020 08:07    Medications / Allergies: per chart  Antibiotics: Anti-infectives (From admission, onward)   Start     Dose/Rate Route Frequency Ordered Stop   04/19/20 0200  cefoTEtan (CEFOTAN) 2 g in sodium chloride 0.9 % 100 mL  IVPB        2 g 200 mL/hr over 30 Minutes Intravenous Every 12 hours 04/18/20 1824 04/19/20 0238   04/18/20 1200  cefoTEtan (CEFOTAN) 2 g in sodium chloride 0.9 % 100 mL IVPB        2 g 200 mL/hr over 30 Minutes Intravenous To ShortStay Surgical 04/17/20 2206 04/18/20 1410   04/17/20 2215  neomycin (MYCIFRADIN) tablet 1,000 mg       "And" Linked Group Details   1,000 mg Oral  Once 04/17/20 2206 04/17/20 2348   04/17/20 2215  metroNIDAZOLE (FLAGYL) tablet 1,000 mg       "And" Linked Group Details   1,000 mg Oral  Once 04/17/20 2206 04/17/20 2336   04/13/20 1145  cefoTEtan (CEFOTAN) 2 g in sodium chloride 0.9 % 100 mL IVPB        2 g 200 mL/hr over 30 Minutes Intravenous On call to O.R. 04/12/20 2006 04/14/20 0559   04/12/20 2100  neomycin (MYCIFRADIN) tablet 1,000 mg       "And" Linked Group Details   1,000 mg Oral 3 times per day 04/12/20 2006 04/13/20 0527   04/12/20 2100  metroNIDAZOLE (FLAGYL) tablet 1,000 mg       "And" Linked Group Details   1,000 mg Oral 3 times per day 04/12/20 2006 04/13/20 0527   04/03/20 1600  ceFEPIme (MAXIPIME) 1 g in sodium chloride 0.9 % 100 mL IVPB        1 g 200 mL/hr over 30 Minutes Intravenous Every 24 hours 04/03/20 0918 04/07/20 1108   04/01/20 1600  ceFEPIme (MAXIPIME) 1 g in sodium chloride 0.9 % 100 mL IVPB  Status:  Discontinued        1 g 200 mL/hr over 30 Minutes Intravenous Every 24 hours 03/31/20 1550 04/03/20 0918   04/01/20 1145  cefTRIAXone (ROCEPHIN) 1 g in sodium chloride 0.9 % 100 mL IVPB        1 g 200 mL/hr over 30 Minutes Intravenous  Once 04/01/20 1057 04/01/20 1308   03/31/20 1415  ceFEPIme (MAXIPIME) 2  g in sodium chloride 0.9 % 100 mL IVPB        2 g 200 mL/hr over 30 Minutes Intravenous  Once 03/31/20 1407 03/31/20 1516        Note: Portions of this report may have been transcribed using voice recognition software. Every effort was made to ensure accuracy; however, inadvertent computerized transcription errors may be  present.   Any transcriptional errors that result from this process are unintentional.    Adin Hector, MD, FACS, MASCRS Gastrointestinal and Minimally Invasive Surgery  [facility] [address] [place], [place] [address] [phone] Fax [phone] Main/Paging  CONTACT INFORMATION: Weekday (9AM-5PM) concerns: Call [facility] main office at [phone] Weeknight (5PM-9AM) or Weekend/Holiday concerns: Check [email or web address] for General Surgery [facility] coverage (Please, do not use SecureChat as it is not reliable communication to operating surgeons for immediate patient care)      04/20/2020  9:35 AM

## 2020-04-20 NOTE — Progress Notes (Signed)
Nutrition Follow-up  DOCUMENTATION CODES:   Severe malnutrition in context of chronic illness  INTERVENTION:   -D/c Ensure Surgery due to poor acceptance -Continue MVI with minerals BID -Continue 500 mg calcium carbonate TID -Continue double protein portions with meals -Continue snacks TID between meals  NUTRITION DIAGNOSIS:   Severe Malnutrition related to chronic illness (colovesical fistula) as evidenced by moderate fat depletion,severe fat depletion,moderate muscle depletion,severe muscle depletion.  Ongoing  GOAL:   Patient will meet greater than or equal to 90% of their needs  Progressing   MONITOR:   PO intake,Supplement acceptance,Diet advancement,Labs,Weight trends,Skin,I & O's  REASON FOR ASSESSMENT:   Malnutrition Screening Tool    ASSESSMENT:   55 year old male who presented to the ED on 12/31 after being found on the floor of his home with nephrostomy tubes out. 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 Admitted with acute renal failure superimposed on CKD IV, metabolic acidosis, hydronephrosis, and severe sepsis secondary to UTI.  01/01 - s/p bilateral percutaneous nephrostomy tube placement and right IJ non-tunneled HD catheter placement, attempted HD with hypotension, rapid response and PCCM consult 1/3- last HD 1/18- s/p LAPAROSCOPIC SIGMOID COLOTOMY WITH COLOSTOMY (N/A Abdomen) HERNIA REPAIR EPIGASTRIC ADULT (N/A Abdomen) CYSTOSCOPY WITH BILATERAL URETERAL STENT CHANGE PLACEMENT (N/A Urethra)  Reviewed I/O's: -1.1 L x 24 hours and -7.3 L since 04/06/20  UOP:1.6 L x 24 hours  Pt unavailable at time of visit.   No output from colostomy yet.   Per general surgery notes, pt to advance to soft diet today.   Pt remains with variable oral intake.  Noted meal completions 10-100%. Pt is receiving double protein portions with meals. He is refusing supplements (Ensure, Boost Breeze, Prosource, [employer]).    Medications reviewed and include zofran.  Labs reviewed: CBGS: 115-165 (inpatient orders for glycemic control are none).   Diet Order:   Diet Order            DIET SOFT Room service appropriate? Yes; Fluid consistency: Thin  Diet effective now                 EDUCATION NEEDS:   Education needs have been addressed  Skin:  Skin Assessment: Skin Integrity Issues: Skin Integrity Issues:: Stage II,Other (Comment) Stage II: coccyx Other: puncture wound right buttocks  Last BM:  04/18/20  Height:   Ht Readings from Last 1 Encounters:  03/31/20 5\' 6"  (1.676 m)    Weight:   Wt Readings from Last 1 Encounters:  04/20/20 55.6 kg   BMI:  Body mass index is 19.78 kg/m.  Estimated Nutritional Needs:   Kcal:  1900-2100  Protein:  120-135 grams  Fluid:  > 1.9 L    Chase [name], RD, LDN, [place] Registered Dietitian II Certified Diabetes Care and Education Specialist Please refer to [facility] for RD and/or RD on-call/weekend/after hours pager

## 2020-04-20 NOTE — Progress Notes (Addendum)
[facility] PROGRESS NOTE  Castor Gittleman HFW:[id number] DOB: Aug 31, 1965 DOA: 03/31/2020 PCP: [name]      Status: Remains inpatient appropriate because:Unsafe d/c plan and Inpatient level of care appropriate due to severity of illness   Dispo: The patient is from: Home              Anticipated d/c is to: SNF              Anticipated d/c date is: > 3 days              Patient currently is not medically stable to d/c. Barriers to discharge: Status post diverting loop colostomy as well as internal stent placement, PT recommending SNF at present we have not received any bed offers from [place] affiliated facilities   Code Status: Full code Family Communication: Patient; at length with son [name] on 1/19. DVT prophylaxis: SQ heparin Vaccination status: Did receive 1 dose of the Moderna COVID-vaccine.  Subsequently refusing any future doses or even a booster shot.  States he had COVID 3 months ago and has reduced immune system and does not feel these immunizations are effective for him.  Foley catheter: Resented with chronic Foley catheter, also has bilateral percutaneous nephrostomy tubes  HPI: HPI on 03/31/2020 by Dr. 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 Patient was reportedly seen by neighbors yesterday in a recliner and was then found on the floor today obtunded. He was sent to [facility] emergency department and is  unable to provide history due to his clinical condition.  Interim history Admitted with acute renal failure on chronic kidney disease, stage V secondary to urinary obstruction and dehydration with electrolyte abnormalities.  Nephrology consulted.  Patient did undergo hemodialysis.  IR placed bilateral percutaneous nephrostomy tubes.  Creatinine did improve with a couple cycles of hemodialysis however plateaued.  Hemodialysis catheter was then removed.  No further plans for hemodialysis at this time.  Nephrology feels that patient can be discharged home with outpatient follow-up.  At this time, discharge is complicated by patient being extremely weak and deconditioned.  Initially patient did not want to be discharged to SNF however this morning he states that he will consider it.   Subjective: Reporting inadequate pain control.  Discussed resumption of Neurontin at lower dose given his low GFR.  Patient verbalized understanding.  Also discussed need to transition to a DNR noting from a clinical standpoint it is doubtful given patient's current debilitated state he would be able to survive a major resuscitative event.  Patient agrees with this but wishes to speak with his sons first.  I did emphasize to the patient that I had previously spoken with [name] who agrees that at least at this juncture patient should be a DNR.  Patient aware that DNR does not mean do not treat and that DNR can be revoked at any time most especially if he demonstrates improvement over time.  Objective: Vitals:   04/19/20 2005 04/20/20 0410  BP: 129/86 116/76  Pulse: 75 81  Resp: 20  20  Temp: 98.6 F (37 C) 98.7 F (37.1 C)  SpO2: 100% 98%    Intake/Output Summary (Last 24 hours) at 04/20/2020 0809 Last data filed at 04/20/2020 0541 Gross per 24 hour  Intake 250 ml  Output 1600 ml  Net -1350 ml   Filed Weights   04/17/20 0511 04/18/20 0610 04/20/20 0500  Weight: 50.4 kg 52.4 kg 55.6 kg    Exam: Constitutional:  Cachectic in appearance, color remains improved, no acute distress Respiratory: Lung sounds are clear to auscultation, stable on room air Cardiovascular: Heart sounds are normal, pulses regular, extremities warm to touch with adequate capillary refill Abdomen: Colostomy stoma pink without significant edema.  Bowel sounds present.  Tolerating diet. Genitourinary: Bilateral percutaneous nephrostomy tubes in place draining clear yellow urine to collection bag; also has chronic Foley catheter draining yellow urine to bedside bag Neurologic: CN 2-12 grossly intact. Sensation intact, DTR normal. Strength 5/5 x all 4 extremities.  Psychiatric: Normal judgment and insight. Alert and oriented x 3.  Flat mood and affect.    Assessment/Plan: Acute problems: Acute renal failure on chronic kidney disease, stage V -Nephrology consulted and appreciated -Creatinine 5.76 in October 2021, although patient presented with a creatinine of 16.69 -Chronic kidney disease 2/2 longstanding obstruction.  Acute kidney injury due to dehydration and sepsis along with ATN with obstruction. -Interventional radiology consulted for bilateral percutaneous nephrostomy tube placement, 04/01/2020 as well as temporary HD catheter placement. -Patient did undergo hemodialysis, 2 cycles with last hemodialysis on 04/04/2019. -Creatinine has now plateaued, currently 5.08 -Though creatinine is still elevated, it is below his baseline.  Patient himself is unsure about wanting continuous HD.  Nephrology recommended that patient follow-up with nephrology as an outpatient.  Recommended continuing bicarb on discharge.  Recurrent orthostasis -Likely from Flomax-has chronic Foley catheter therefore do not see any harm at this juncture and discontinuing Flomax -General surgery has ordered cosyntropin stimulation test to ensure no issues with adrenal insufficiency although resting blood pressures are stable -Recommend checking orthostatic vital signs  every shift  Bilateral hydronephrosis s/p cystoscopy,B ureteral stent placement w/ B retrograde pyelogram -Per IR follow-up on 04/07/2019, patient had urinary obstruction secondary to pelvic mass with subsequent bilateral hydronephrosis in the setting of bilateral ureteral stents, bilateral PCNs inadvertently removed (unknown how long they have been out), status post bilateral PCN placement on 04/01/2020. -1/18 Dr Alinda Money performing concurrent procedure with surgical team (cystoscopy and bilateral ureteral stent change) -Presented w/ chronic Foley catheter as well. -Also has history of Hodgkin's lymphoma and is status postchemotherapy and radiation which may have also contributed to recurrent obstructive renal disease  History of pelvic abscess/known colovesical fistula s/p lap sigmoid colectomy w/epigastric hernia repair (1/18) -Noted on 11/15/2019, status post left transgluteal drain by IR/colovesicular fistula -1/18 she will undergo diverting loop colostomy by [name] in conjunction with urological procedure by Dr. Alinda Money as described above -Primary GI is Dr. Rush Landmark. Dr Carlean Purl plans on preop colonoscopy 1/18 (today) -Cont scheduled Zofran for recurrent nausea  Goals of care -Palliative care consulted and appreciated -Patient full code and refused to discuss any decisions regarding plan of care -Patient should follow up with palliative care as an outpatient -Gust with patient's son [name].  Recommendation is to have eventual discussion with patient and if necessary family regarding instituting DNR status.  It is felt by the medical team that patient is so poorly conditioned that he would not tolerate a resuscitative event and likely would not survive.  Emphasis would be on  DNR clarification and not comfort care since at this juncture patient appears to be improving slightly although long-term prognosis is not good.  Would like patient to understand that DNR does not mean do not treat  Severe  physical deconditioning, history of prior CVA -July 2021, history of atrial fibrillation, patient is not a candidate for anticoagulation due to GI bleed and thrombocytopenia -Currently no focal deficits  Severe protein calorie malnutrition Nutrition Problem: Severe Malnutrition Etiology: chronic illness (colovesical fistula) Signs/Symptoms: moderate fat depletion,severe fat depletion,moderate muscle depletion,severe muscle depletion Interventions: MVI,Refer to RD note for recommendations Estimated body mass index is 19.78 kg/m as calculated from the following:   Height as of this encounter: 5\' 6"  (1.676 m).   Weight as of this encounter: 55.6 kg.  Chronic pain -Pt was taking a massive dose of Neurontin PTA (800 mg TID)-with his low GFR rec is for 200 mg/day- d/w pharmacist and will proceed w/ 100 mg TID  -Continue low-dose Zanaflex   Other problems: Severe dehydration with multiple metabolic derangements as follows: Severe metabolic acidosis, hyperkalemia, hyperphosphatemia -Resolved after rehydration, dialysis and Kayexalate  Severe sepsis secondary to urinary tract infection -Present on admission-sepsis physiology has now resolved -Patient presented with encephalopathy as well as hypothermia and leukocytosis -Blood cultures were negative x2 -Urine culture from 12/31 and 1/1 showed multiple species -CT renal stone study 12/31: Bladder wall appeared slightly thickened.  No significant fluid collection in pelvis -Patient completed 7 days of IV cefepime  PTSD -Sedatives held  History of gastric bypass  -Avoid NSAIDs (unable to use currently in context of chronic kidney disease)  Data Reviewed: Basic Metabolic Panel: Recent Labs  Lab 04/17/20 0307 04/18/20 1205 04/19/20 0057  NA 137 136 135  K 4.6 4.2 4.2  CL 103 105 103  CO2 25  --  20*  GLUCOSE 95 82 141*  BUN 29* 29* 25*  CREATININE 4.97* 4.80* 4.64*  CALCIUM 8.3*  --  8.2*   Liver Function Tests: Recent Labs   Lab 04/17/20 0307  AST 33  ALT 26  ALKPHOS 122  BILITOT 0.5  PROT 6.4*  ALBUMIN 2.3*   No results for input(s): LIPASE, AMYLASE in the last 168 hours. No results for input(s): AMMONIA in the last 168 hours. CBC: Recent Labs  Lab 04/17/20 0307 04/18/20 1205 04/19/20 0057  WBC 6.8  --  7.9  HGB 9.4* 11.2* 9.1*  HCT 31.3* 33.0* 28.9*  MCV 97.5  --  96.3  PLT 341  --  349   Cardiac Enzymes: No results for input(s): CKTOTAL, CKMB, CKMBINDEX, TROPONINI in the last 168 hours. BNP (last 3 results) No results for input(s): BNP in the last 8760 hours.  ProBNP (last 3 results) No results for input(s): PROBNP in the last 8760 hours.  CBG: Recent Labs  Lab 04/19/20 1201 04/19/20 1614 04/19/20 2015 04/20/20 0417 04/20/20 0718  GLUCAP 76 86 115* 80 115*    Recent Results (from the past 240 hour(s))  Surgical pcr screen     Status: None   Collection Time: 04/13/20  4:48 AM   Specimen: Nasal Mucosa; Nasal Swab  Result Value Ref Range Status   MRSA, PCR NEGATIVE NEGATIVE Final   Staphylococcus aureus NEGATIVE NEGATIVE Final    Comment: (NOTE) The Xpert SA Assay (FDA approved for NASAL specimens in patients 70 years of age and older), is one component of a comprehensive surveillance program. It is not intended to diagnose infection nor to guide or monitor treatment. Performed at [facility], [place] [address] [place], [place] [address]      Studies: DG Cystogram  Result Date: 04/19/2020 CLINICAL DATA:  Bilateral stent placement EXAM: NEPHROSTOMY TUBE INJECTION AND BILATERAL STENT PLACEMENT COMPARISON:  CT scan 03/31/2020 FINDINGS: The first 2 images depict injection of a left nephrostomy tube, showing filling defects in the right renal collecting system which could be gas bubbles or blood clots. The proximal loop of the left double-J ureteral stent appears to be in the proximal ureter on these images. There mobile filling defects in the ureter, probably gas bubbles.  The image at 4:16 p.m. shows a double-J ureteral stent with proximal loop now in the collecting system. The image at 4:18 p.m. demonstrates injection of the right nephrostomy tube showing a dilated right collecting system extending down to the level of the UPJ. The double-J ureteral stent proximal margin appears to be in the proximal ureter on this image, but by 4:22 p.m. the double-J ureteral stent proximal loop is in the dilated collecting system. The final image at 4:23 p.m. demonstrates the distal ends of the stents both in the urinary bladder which demonstrates dilute contrast. IMPRESSION: 1. Positioning of the double-J ureteral stents with proximal loops in the renal pelvis bilaterally. Electronically Signed   By: Van Clines M.D.   On: 04/19/2020 08:07    Scheduled Meds: . sodium chloride   Intravenous Once  . acetaminophen  500 mg Oral Q6H  . alvimopan  12 mg Oral BID  . calcium carbonate  1 tablet Oral TID  . Chlorhexidine Gluconate Cloth  6 each Topical Daily  . cholecalciferol  1,000 Units Oral Daily  . DULoxetine  60 mg Oral Daily   And  . DULoxetine  30 mg Oral QHS  . feeding supplement  237 mL Oral BID BM  . gabapentin  300 mg Oral QHS  . heparin  5,000 Units Subcutaneous Q8H  . lip balm  1 application Topical BID  . multivitamin with minerals  1 tablet Oral BID  . ondansetron  4 mg Oral Q6H   Or  . ondansetron (ZOFRAN) IV  4 mg Intravenous Q6H  . pantoprazole  40 mg Oral QHS  . polycarbophil  625 mg Oral BID  . sodium bicarbonate  1,300 mg Oral TID  . sodium chloride flush  3 mL Intravenous Q12H  . sodium chloride flush  5 mL Intracatheter Q8H  . tamsulosin  0.4 mg Oral QHS  . tiZANidine  2 mg Oral BID   Continuous Infusions: . sodium chloride    . lactated ringers    . lactated ringers      Active Problems:   CKD (chronic kidney disease) stage 5, GFR less than 15 ml/min (HCC)   Chronic pain   Protein-calorie malnutrition, severe   Pelvic abscess with  colovescial fistula   Acute kidney failure ([place])   History of COVID-19   History of gastric bypass   Hodgkin lymphoma, unspecified, unspecified site (HCC)   Metabolic acidosis   Toxic metabolic encephalopathy   PTSD (post-traumatic stress disorder)   Bilateral ureteral obstruction s/p perc nephrosotmy & stenting   History of CVA (cerebrovascular accident)   Incisional hernia - epigastric   Sepsis with acute renal failure without septic shock ([place])   DNR (do not resuscitate) discussion   Palliative care by specialist   Adult failure to thrive   Acute respiratory failure with hypoxia ([place])   Diverticulitis of sigmoid colon with abscess s/p colectomy/colostomy Jeanette Caprice) 04/18/2020  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[facility])   Personality disorder    Consultants:    Procedures:    Antibiotics: Anti-infectives (From admission, onward)   Start     Dose/Rate Route Frequency Ordered Stop   04/19/20 0200  cefoTEtan (CEFOTAN) 2 g in sodium chloride 0.9 % 100 mL IVPB        2 g 200 mL/hr over 30 Minutes Intravenous Every 12 hours 04/18/20 1824 04/19/20 0238   04/18/20 1200  cefoTEtan (CEFOTAN) 2 g in sodium chloride 0.9 % 100 mL IVPB        2 g 200 mL/hr over 30 Minutes Intravenous To ShortStay Surgical 04/17/20 2206 04/18/20 1410   04/17/20 2215  neomycin (MYCIFRADIN) tablet 1,000 mg       "And" Linked Group Details   1,000 mg Oral  Once 04/17/20 2206 04/17/20 2348   04/17/20 2215  metroNIDAZOLE (FLAGYL) tablet 1,000 mg       "And" Linked Group Details   1,000 mg Oral  Once 04/17/20 2206 04/17/20 2336   04/13/20 1145  cefoTEtan (CEFOTAN) 2 g in sodium chloride 0.9 % 100 mL IVPB        2 g 200 mL/hr over 30 Minutes Intravenous On call to O.R. 04/12/20 2006 04/14/20 0559   04/12/20 2100  neomycin (MYCIFRADIN) tablet 1,000 mg       "And" Linked Group Details   1,000 mg Oral 3 times per day 04/12/20 2006 04/13/20 0527   04/12/20 2100  metroNIDAZOLE (FLAGYL) tablet 1,000 mg       "And" Linked  Group Details   1,000 mg Oral 3 times per day 04/12/20 2006 04/13/20 0527   04/03/20 1600  ceFEPIme (MAXIPIME) 1 g in sodium chloride 0.9 % 100 mL IVPB        1 g 200 mL/hr over 30 Minutes Intravenous Every 24 hours 04/03/20 0918 04/07/20 1108   04/01/20 1600  ceFEPIme (MAXIPIME) 1 g in sodium chloride 0.9 % 100 mL IVPB  Status:  Discontinued        1 g 200 mL/hr over 30 Minutes Intravenous Every 24 hours 03/31/20 1550 04/03/20 0918   04/01/20 1145  cefTRIAXone (ROCEPHIN) 1 g in sodium chloride 0.9 % 100 mL IVPB        1 g 200 mL/hr over 30 Minutes Intravenous  Once 04/01/20 1057 04/01/20 1308   03/31/20 1415  ceFEPIme (MAXIPIME) 2 g in sodium chloride 0.9 % 100 mL IVPB        2 g 200 mL/hr over 30 Minutes Intravenous  Once 03/31/20 1407 03/31/20 1516       Time spent:  20 minutes    Erin Hearing ANP  [facility] 7 am - 330 pm/M-F for direct patient care and secure chat Please refer to Amion for contact info 20  days   Addendum:  Patient seen and examined, agree with assessment and plan per Ms. Lissa Merlin.  Overall improving and eating better, working with PT.  Appreciate general surgery and urology recommendations.   Estill Cotta M.D.  [facility] 04/20/2020, 3:49 PM

## 2020-04-20 NOTE — Progress Notes (Signed)
Patient ID: Chase [name]., male   DOB: Nov 05, 1965, 55 y.o.   MRN: [id number]  2 Days Post-Op Subjective: Pt s/p diverting colostomy and Hartmann's pouch Tuesday.  Ureteral stents changed by Dr. Louis Meckel.  Left sided stent was noted to have migrated and new stent now properly positioned.  Pt feeling better and eating better now.   Objective: Vital signs in last 24 hours: Temp:  [97.8 F (36.6 C)-98.7 F (37.1 C)] 98.7 F (37.1 C) (01/20 0410) Pulse Rate:  [75-81] 81 (01/20 0410) Resp:  [14-20] 20 (01/20 0410) BP: (94-129)/(72-86) 116/76 (01/20 0410) SpO2:  [97 %-100 %] 98 % (01/20 0410) Weight:  [55.6 kg] 55.6 kg (01/20 0500)  Intake/Output from previous day: 01/19 0701 - 01/20 0700 In: 490 [P.O.:480; I.V.:10] Out: 1600 [Urine:1600] Intake/Output this shift: No intake/output data recorded.  Physical Exam:  General: Alert and oriented Abd: B PCNs in place with minimal drainage on right and clear yellow drainage on left. GU: Foley in place draining clear urine.  Lab Results: Recent Labs    04/18/20 1205 04/19/20 0057  HGB 11.2* 9.1*  HCT 33.0* 28.9*   BMET Recent Labs    04/18/20 1205 04/19/20 0057  NA 136 135  K 4.2 4.2  CL 105 103  CO2  --  20*  GLUCOSE 82 141*  BUN 29* 25*  CREATININE 4.80* 4.64*  CALCIUM  --  8.2*     Studies/Results:   Assessment/Plan: 1) Colovesical fistula:  I discussed case with [name] today.  Intraoperatively, he has lower concern about persistent fistula indicating possibility of fistula having healed.   Therefore, will proceed with a CT cystogram to determine if there is any evidence of persistent fistula. 2) Bilateral ureteral obstruction: Ureteral stents now replaced and repositioned.  Will begin by clamping nephrostomy tubes and monitoring renal function over the next few days.  If renal function remains stable, it should be ok to remove nephrostomy tubes next week prior to hospital discharge.  3) Bladder outlet  obstruction:  Will address this last after addressing above issues.  Continue tamsulosin and will eventually hopefull proceed with a voiding trial (either as inpatient or outpatient.   LOS: 20 days   [name] 04/20/2020, 1:40 PM

## 2020-04-20 NOTE — Consult Note (Addendum)
[place] Nurse ostomy follow up Patient receiving care in [facility] 6N02. Fecal 2 pc 2 and 3/4 inch pouch placed yesterday intact. A few drops of serosanginous drainage in pouch. Supplies on shelf in the room.   Patient NOT agreeing to allow me to enroll him in [facility] program. He wants his primary care doctor to get him into a system through the [place] program.  I do not have access to that enrollment ability for him.  He tells me he has let the Case Manger/Social Worker know of the two SNF organizations he will agreed to be discharged to.  If neither of these facilities are available to him, he wishes to go home where his mother "will help".  BUT, he does NOT want her taught how to perform ostomy care.  I explained that if he goes home, he MUST learn to do all of the ostomy care himself.  He is too sleepy to do anything this morning, he could barely open his eyes.  When I showed the education booklet and contents, he explained he cannot see the writing without his glasses, and he cannot locate them.   Val Riles, RN, MSN, CWOCN, CNS-BC, pager [phone]

## 2020-04-20 NOTE — NC FL2 (Signed)
[place] LEVEL OF CARE SCREENING TOOL     IDENTIFICATION  Patient Name: Chase [name]. Birthdate: 10-Mar-1966 Sex: male Admission Date (Current Location): 03/31/2020  [facility] and [place] Number:  Herbalist and Address:  [facility], [place] [address] [place], [place] [address]      Provider Number: [id number]  Attending Physician Name and Address:  Mendel Corning, MD  Relative Name and Phone Number:  S[place], [facility])   [phone] [facility])    Current Level of Care: Hospital Recommended Level of Care: [place] Prior Approval Number:    Date Approved/Denied:   PASRR Number: [id number]  Discharge Plan: SNF    Current Diagnoses: Patient Active Problem List   Diagnosis Date Noted  . Personality disorder  04/19/2020  . Diverticulitis of sigmoid colon with abscess s/p colectomy/colostomy [facility] Caprice) 04/18/2020 04/18/2020  . 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[facility]) 04/18/2020  . Acute respiratory failure with hypoxia ([place])   . DNR (do not resuscitate) discussion   . Palliative care by specialist   . Adult failure to thrive   . Sepsis with acute renal failure without septic shock ([place]) 03/31/2020  . Incisional hernia - epigastric 01/04/2020  . Chronic narcotic use 01/02/2020  . Anemia in chronic kidney disease 01/02/2020  . ATN (acute tubular necrosis) ([place]) 12/31/2019  . Candida UTI 12/31/2019  . Electrolyte abnormality 12/31/2019  . Increased anion gap metabolic acidosis 41/28/7867  . Microcytic anemia 12/31/2019  . ABLA (acute blood loss anemia) 12/31/2019  . Anxiety and depression 12/31/2019  . History of CVA (cerebrovascular accident) 12/31/2019  . BMI 24.0-24.9, adult 12/31/2019  . Bilateral ureteral obstruction s/p perc nephrosotmy & stenting 12/30/2019  . AKI (acute kidney injury) ([place]) 12/24/2019  . Pelvic abscess with colovescial fistula 11/15/2019  . E. coli UTI (urinary tract infection) 11/15/2019   . PTSD (post-traumatic stress disorder)   . Chemical exposure   . Cervical radiculopathy   . Septic shock ([place]) 11/08/2019  . Protein-calorie malnutrition, severe 10/23/2019  . Cerebral embolism with cerebral infarction 10/18/2019  . Acute left-sided weakness 10/17/2019  . CKD (chronic kidney disease) stage 5, GFR less than 15 ml/min (HCC) 10/17/2019  . Chronic pain 10/17/2019  . Fall 10/17/2019  . Pseudomonas urinary tract infection 08/24/2019  . BPH (benign prostatic hyperplasia) 08/24/2019  . Acute kidney failure ([place]) 08/24/2019  . Anemia 08/24/2019  . Dyspnea 08/24/2019  . History of COVID-19 08/24/2019  . History of elevated PSA 08/24/2019  . 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 Hyponatremia 08/24/2019  . Metabolic acidosis 67/20/9470  . Skin lesion of neck 08/24/2019  . Toxic metabolic encephalopathy 96/28/3662  . Cervical spondylosis without myelopathy 06/19/2016  . Neck pain 09/04/2015  . History of stomach ulcers 05/19/2013  . Male erectile dysfunction 05/19/2013  . Hodgkin lymphoma, unspecified, unspecified site ([place]) 12/10/2012  . Iron deficiency anemia 12/10/2012  . Pernicious anemia 12/10/2012  . Arthralgia of multiple sites 04/20/2012  . Postlaminectomy syndrome, not elsewhere classified 04/20/2012    Orientation RESPIRATION BLADDER Height & Weight     Self,Time,Situation,Place  Normal Indwelling catheter (Nephrostomy tubes/ bags intact bilaterally.) Weight: 55.6 kg Height:  5\' 6"  (167.6 cm)  BEHAVIORAL SYMPTOMS/MOOD NEUROLOGICAL BOWEL NUTRITION STATUS      Colostomy Diet (See discharge summary)  AMBULATORY STATUS COMMUNICATION OF NEEDS Skin   Limited Assist   Surgical wounds  Personal Care Assistance Level of Assistance  Bathing,Feeding,Dressing Bathing Assistance: Limited assistance Feeding assistance: Independent Dressing Assistance: Limited assistance     Functional Limitations Info  Sight,Hearing,Speech Sight Info:  Adequate Hearing Info: Adequate Speech Info: Adequate    SPECIAL CARE FACTORS FREQUENCY  PT (By licensed PT),OT (By licensed OT)     PT Frequency: 5 x per week OT Frequency: 5 x per week            Contractures Contractures Info: Not present    Additional Factors Info  Code Status,Allergies,Psychotropic Code Status Info: Full code Allergies Info: Cipro, Nsaids Psychotropic Info: cymbalta, vistaril, neurontin         Current Medications (04/20/2020):  This is the current hospital active medication list Current Facility-Administered Medications  Medication Dose Route Frequency Provider Last Rate Last Admin  . 0.9 %  sodium chloride infusion (Manually program via Guardrails IV Fluids)   Intravenous Once Michael Boston, MD      . 0.9 %  sodium chloride infusion  250 mL Intravenous PRN Michael Boston, MD      . acetaminophen (TYLENOL) tablet 325-650 mg  325-650 mg Oral Q6H PRN Rai, Ripudeep K, MD      . acetaminophen (TYLENOL) tablet 500 mg  500 mg Oral Q6H Kyere, Belinda K, NP   500 mg at 04/20/20 0532  . alvimopan (ENTEREG) capsule 12 mg  12 mg Oral BID Michael Boston, MD   12 mg at 04/19/20 2215  . calcium carbonate (TUMS - dosed in mg elemental calcium) chewable tablet 200 mg of elemental calcium  1 tablet Oral TID Nelida Meuse III, MD   200 mg of elemental calcium at 04/19/20 2212  . Chlorhexidine Gluconate Cloth 2 % PADS 6 each  6 each Topical Daily Nelida Meuse III, MD   6 each at 04/19/20 1000  . cholecalciferol (VITAMIN D3) tablet 1,000 Units  1,000 Units Oral Daily Michael Boston, MD      . DULoxetine (CYMBALTA) DR capsule 60 mg  60 mg Oral Daily Nelida Meuse III, MD   60 mg at 04/19/20 1042   And  . DULoxetine (CYMBALTA) DR capsule 30 mg  30 mg Oral QHS Nelida Meuse III, MD   30 mg at 04/19/20 2212  . feeding supplement (ENSURE SURGERY) liquid 237 mL  237 mL Oral BID BM Michael Boston, MD      . gabapentin (NEURONTIN) capsule 300 mg  300 mg Oral Ardeen Fillers, MD       . heparin injection 5,000 Units  5,000 Units Subcutaneous Q8H Nelida Meuse III, MD   5,000 Units at 04/20/20 0532  . HYDROmorphone (DILAUDID) injection 0.5-1 mg  0.5-1 mg Intravenous Q2H PRN Michael Boston, MD      . hydrOXYzine (ATARAX/VISTARIL) tablet 10 mg  10 mg Oral TID PRN Michael Boston, MD      . iohexol (OMNIPAQUE) 300 MG/ML solution 50 mL  50 mL Per Tube Once PRN Nelida Meuse III, MD      . lactated ringers bolus 1,000 mL  1,000 mL Intravenous Once Michael Boston, MD      . lactated ringers bolus 1,000 mL  1,000 mL Intravenous Q8H PRN Gross, Remo Lipps, MD      . lip balm (CARMEX) ointment 1 application  1 application Topical BID Michael Boston, MD   1 application at 98/33/82 2214  . magic mouthwash  15 mL Oral QID PRN Michael Boston, MD      .  multivitamin with minerals tablet 1 tablet  1 tablet Oral BID Nelida Meuse III, MD   1 tablet at 04/19/20 2213  . ondansetron (ZOFRAN) tablet 4 mg  4 mg Oral Q6H Nelida Meuse III, MD   4 mg at 04/20/20 0532   Or  . ondansetron (ZOFRAN) injection 4 mg  4 mg Intravenous Q6H Nelida Meuse III, MD   4 mg at 04/19/20 1719  . oxyCODONE (Oxy IR/ROXICODONE) immediate release tablet 5-10 mg  5-10 mg Oral Q4H PRN Michael Boston, MD      . pantoprazole (PROTONIX) EC tablet 40 mg  40 mg Oral QHS Nelida Meuse III, MD   40 mg at 04/19/20 2213  . polycarbophil (FIBERCON) tablet 625 mg  625 mg Oral BID Michael Boston, MD   625 mg at 04/19/20 2214  . prochlorperazine (COMPAZINE) tablet 5-10 mg  5-10 mg Oral Q6H PRN Michael Boston, MD      . sodium bicarbonate tablet 1,300 mg  1,300 mg Oral TID Nelida Meuse III, MD   1,300 mg at 04/19/20 2213  . sodium chloride flush (NS) 0.9 % injection 3 mL  3 mL Intravenous Gorden Harms, MD   3 mL at 04/19/20 2221  . sodium chloride flush (NS) 0.9 % injection 3 mL  3 mL Intravenous PRN Michael Boston, MD      . sodium chloride flush (NS) 0.9 % injection 5 mL  5 mL Intracatheter Q8H Nelida Meuse III, MD   5 mL at  04/20/20 0533  . tamsulosin (FLOMAX) capsule 0.4 mg  0.4 mg Oral Ardeen Fillers, MD   0.4 mg at 04/19/20 2212  . tiZANidine (ZANAFLEX) tablet 2 mg  2 mg Oral BID Michael Boston, MD         Discharge Medications: Please see discharge summary for a list of discharge medications.  Relevant Imaging Results:  Relevant Lab Results:   Additional Information SS# [id number]  Curlene Labrum, RN

## 2020-04-20 NOTE — Progress Notes (Signed)
Occupational Therapy Treatment Patient Details Name: Chase LOTHAMER Sr. MRN: [id number] DOB: Oct 13, 1965 Today's Date: 04/20/2020    History of present illness 55 year old male with PMH of Hodgkin's lymphoma s/p chemotherapy 2011-2012, gastric bypass in 4665 complicated by anastomotic ulcer in 2014, colovesical fistula with abscess s/p PC drain, stage IV CKD, bilateral ureteral stenosis with ureteral stents and bilateral percutaneous nephrostomy tubes, history of CVA in 2021 (residual L sided weakness). He was admitted for acute renal failure complicating stage V CKD secondary to urinary obstruction and dehydration with severe electrolyte derangements.  Urology was consulted.  Patient underwent hemodialysis. s/p ureteral stent placement, retrograde pyelogram, lap sigmoid colectomy with colostomy, and epigastric hernia repair on 1/18.   OT comments  This 55 yo male is willing to work with therapy; however is limited today due to orthostatic HTN. We were able to bring his BP up while seated EOB by putting on SCDs and having him do leg exercises, but when he stood to turn from bed to recliner with SCDs on BP dropped. Spoke with RN about asking MD about trying TED hose with him. He will continue to benefit from acute OT with follow up at SNF.  Supine: 96/63 HR 66 Sitting: 54/41 HR 87 Sitting with SCDs 3 minutes 78/60 HR 91 Sitting with SCDs 5 mintues 92/73  HR 89 Stand pivot to chair 65/46 HR 96 Sitting in chair with SCD's and legs elevated 3 minutes 90/78  HR 90   Follow Up Recommendations  SNF    Equipment Recommendations  Other (comment) (TBD next venue)       Precautions / Restrictions Precautions Precautions: Fall Precaution Comments: x2 nephrostomy bagsand colostomy; orthostatic HTN Restrictions Weight Bearing Restrictions: No       Mobility Bed Mobility Overal bed mobility: Needs Assistance Bed Mobility: Rolling;Sidelying to Sit Rolling: Min assist Sidelying to sit: Min  assist       General bed mobility comments: coming up on left side  Transfers Overall transfer level: Needs assistance Equipment used:  (Bil HHA (therapist in front of him)) Transfers: Sit to/from Stand Sit to Stand: Min assist   Squat pivot transfers: Mod assist     General transfer comment: Mod A to come up and stand pivot, increased dizziness with standing    Balance Overall balance assessment: Needs assistance Sitting-balance support: No upper extremity supported Sitting balance-Leahy Scale: Fair     Standing balance support: Bilateral upper extremity supported Standing balance-Leahy Scale: Poor Standing balance comment: reliant on external assist                           ADL either performed or assessed with clinical judgement   ADL Overall ADL's : Needs assistance/impaired     Grooming: Oral care;Sitting Grooming Details (indicate cue type and reason): setup at EOB                 Toilet Transfer: Moderate assistance;Stand-pivot Toilet Transfer Details (indicate cue type and reason): bed>recliner (Bil HHA with therapist in front of him)                 Vision Patient Visual Report: No change from baseline            Cognition Arousal/Alertness: Awake/alert Behavior During Therapy: WFL for tasks assessed/performed Overall Cognitive Status: Within Functional Limits for tasks assessed  General Comments: Continues to want to work to get better and stronger--limited by orthostatic HTN again today                   Pertinent Vitals/ Pain       Pain Assessment: 0-10 Pain Score: 8  Pain Location: abdomen Pain Descriptors / Indicators: Spasm;Discomfort Pain Intervention(s): Limited activity within patient's tolerance;Monitored during session;Repositioned         Frequency  Min 2X/week        Progress Toward Goals  OT Goals(current goals can now be found in the care plan  section)  Progress towards OT goals: Not progressing toward goals - comment (due to orthostatic HTN)  Acute Rehab OT Goals Patient Stated Goal: less pain and be able to move without getting dizzy OT Goal Formulation: With patient Time For Goal Achievement: 04/20/20 Potential to Achieve Goals: Good  Plan Discharge plan remains appropriate       AM-PAC OT "6 Clicks" Daily Activity     Outcome Measure   Help from another person eating meals?: None Help from another person taking care of personal grooming?: A Little Help from another person toileting, which includes using toliet, bedpan, or urinal?: A Lot Help from another person bathing (including washing, rinsing, drying)?: A Lot Help from another person to put on and taking off regular upper body clothing?: A Little Help from another person to put on and taking off regular lower body clothing?: Total 6 Click Score: 15    End of Session    OT Visit Diagnosis: Unsteadiness on feet (R26.81);Other abnormalities of gait and mobility (R26.89);Muscle weakness (generalized) (M62.81);Adult, failure to thrive (R62.7)   Activity Tolerance  (pt limited by orthostatic HTN)   Patient Left in chair;with call bell/phone within reach;with chair alarm set   Nurse Communication Mobility status (when gets back to bed, don't pause (sit>stand>turn> lay down))        Time: 9937-1696 OT Time Calculation (min): 36 min  Charges: OT General Charges $OT Visit: 1 Visit OT Treatments $Self Care/Home Management : 23-37 mins  [address] [address], OTR/L Acute [employer] Pager [phone] Office [phone]      Almon Register 04/20/2020, 1:06 PM

## 2020-04-21 ENCOUNTER — Inpatient Hospital Stay (HOSPITAL_COMMUNITY)
Admission: RE | Admit: 2020-04-21 | Payer: No Typology Code available for payment source | Source: Ambulatory Visit | Admitting: Surgery

## 2020-04-21 ENCOUNTER — Encounter (HOSPITAL_COMMUNITY): Admission: RE | Payer: Self-pay | Source: Ambulatory Visit

## 2020-04-21 DIAGNOSIS — R627 Adult failure to thrive: Secondary | ICD-10-CM | POA: Diagnosis not present

## 2020-04-21 DIAGNOSIS — R0603 Acute respiratory distress: Secondary | ICD-10-CM | POA: Diagnosis not present

## 2020-04-21 DIAGNOSIS — N170 Acute kidney failure with tubular necrosis: Secondary | ICD-10-CM | POA: Diagnosis not present

## 2020-04-21 DIAGNOSIS — J9601 Acute respiratory failure with hypoxia: Secondary | ICD-10-CM | POA: Diagnosis not present

## 2020-04-21 DIAGNOSIS — N321 Vesicointestinal fistula: Secondary | ICD-10-CM | POA: Diagnosis not present

## 2020-04-21 DIAGNOSIS — R64 Cachexia: Secondary | ICD-10-CM

## 2020-04-21 DIAGNOSIS — N185 Chronic kidney disease, stage 5: Secondary | ICD-10-CM | POA: Diagnosis not present

## 2020-04-21 DIAGNOSIS — Z933 Colostomy status: Secondary | ICD-10-CM | POA: Diagnosis not present

## 2020-04-21 LAB — BASIC METABOLIC PANEL
Anion gap: 9 (ref 5–15)
BUN: 23 mg/dL — ABNORMAL HIGH (ref 6–20)
CO2: 23 mmol/L (ref 22–32)
Calcium: 7.5 mg/dL — ABNORMAL LOW (ref 8.9–10.3)
Chloride: 106 mmol/L (ref 98–111)
Creatinine, Ser: 4.31 mg/dL — ABNORMAL HIGH (ref 0.61–1.24)
GFR, Estimated: 15 mL/min — ABNORMAL LOW (ref >60)
Glucose, Bld: 90 mg/dL (ref 70–99)
Potassium: 3.8 mmol/L (ref 3.5–5.1)
Sodium: 138 mmol/L (ref 135–145)

## 2020-04-21 LAB — MAGNESIUM: Magnesium: 1.8 mg/dL (ref 1.7–2.4)

## 2020-04-21 LAB — CBC
HCT: 24.9 % — ABNORMAL LOW (ref 39.0–52.0)
Hemoglobin: 7.5 g/dL — ABNORMAL LOW (ref 13.0–17.0)
MCH: 29.8 pg (ref 26.0–34.0)
MCHC: 30.1 g/dL (ref 30.0–36.0)
MCV: 98.8 fL (ref 80.0–100.0)
Platelets: 287 10*3/uL (ref 150–400)
RBC: 2.52 MIL/uL — ABNORMAL LOW (ref 4.22–5.81)
RDW: 17 % — ABNORMAL HIGH (ref 11.5–15.5)
WBC: 6.6 10*3/uL (ref 4.0–10.5)
nRBC: 0 % (ref 0.0–0.2)

## 2020-04-21 LAB — ACTH STIMULATION, 3 TIME POINTS
Cortisol, 30 Min: 23.3 ug/dL
Cortisol, 60 Min: 27.7 ug/dL
Cortisol, Base: 9 ug/dL

## 2020-04-21 LAB — GLUCOSE, CAPILLARY
Glucose-Capillary: 173 mg/dL — ABNORMAL HIGH (ref 70–99)
Glucose-Capillary: 108 mg/dL — ABNORMAL HIGH (ref 70–99)
Glucose-Capillary: 158 mg/dL — ABNORMAL HIGH (ref 70–99)
Glucose-Capillary: 127 mg/dL — ABNORMAL HIGH (ref 70–99)
Glucose-Capillary: 139 mg/dL — ABNORMAL HIGH (ref 70–99)
Glucose-Capillary: 88 mg/dL (ref 70–99)

## 2020-04-21 LAB — PREALBUMIN: Prealbumin: 13.4 mg/dL — ABNORMAL LOW (ref 18–38)

## 2020-04-21 LAB — PHOSPHORUS: Phosphorus: 2.6 mg/dL (ref 2.5–4.6)

## 2020-04-21 LAB — CORTISOL-AM, BLOOD: Cortisol - AM: 8.9 ug/dL (ref 6.7–22.6)

## 2020-04-21 SURGERY — COLECTOMY, PARTIAL, ROBOT-ASSISTED, LAPAROSCOPIC
Anesthesia: General

## 2020-04-21 MED ORDER — ACETAMINOPHEN 500 MG PO TABS
500.0000 mg | ORAL_TABLET | Freq: Four times a day (QID) | ORAL | Status: DC | PRN
  Administered 2020-04-22 – 2020-05-03 (×26): 500 mg via ORAL
  Filled 2020-04-21 (×27): qty 1

## 2020-04-21 NOTE — Progress Notes (Signed)
Chase [name] [id number] 01/06/1966  CARE TEAM:  PCP: [facility], [place] Team: Patient Care Team: [facility], [name] as PCP - General Raynelle Bring, MD as Consulting Physician (Urology) Michael Boston, MD as Consulting Physician (General Surgery) Kandace Blitz, MD as Referring Physician (Pain Medicine) Mignon Pine, DO as Consulting Physician (Infectious Diseases) Garvin Fila, MD as Consulting Physician (Neurology) Gatha Mayer, MD as Consulting Physician (Gastroenterology) Joylene Grapes Shaune Pollack, MD as Consulting Physician (Nephrology)  Inpatient Treatment Team: Treatment Team: Attending Provider: Mendel Corning, MD; Consulting Physician: Reesa Chew, MD; Technician: Rica Koyanagi, NT; Consulting Physician: Michael Boston, MD; Consulting Physician: Raynelle Bring, MD; [place] Nurse: Meryle Ready, RN; [place] Nurse: Nadara Mode, RN; Rounding Team: Jacquelyne Balint Team, MD; Nurse Practitioner: Samella Parr, NP; Case Manager: Arna Snipe, RN; Consulting Physician: Nolon Nations, MD; [place] Nurse: Jeannie Fend, FNP; Technician: Gasper Sells, NT; Rounding Team: Ivonne Andrew, MD; Registered Nurse: Donzetta Matters, RN; Registered Nurse: Roselind Rily, RN; Registered Nurse: Debby Bud, RN; Physical Therapist: Alda Lea, PT; Utilization Review: Tressie Stalker, RN; Technician: Warden Fillers, NT   Problem List:   Active Problems:   CKD (chronic kidney disease) stage 5, GFR less than 15 ml/min (HCC)   Acute kidney failure ([place])   PTSD (post-traumatic stress disorder)   Cachexia ([place])   Protein-calorie malnutrition, severe   Pelvic abscess with colovescial fistula   History of gastric bypass   Bilateral ureteral obstruction s/p perc nephrosotmy & stenting   History of CVA (cerebrovascular accident)   Chronic pain   History of COVID-19   Hodgkin lymphoma, unspecified, unspecified  site ([place])   Metabolic acidosis   Pernicious anemia   Toxic metabolic encephalopathy   Anxiety and depression   BMI less than 19,adult   Chronic narcotic use   Anemia in chronic kidney disease   Incisional hernia - epigastric   Sepsis with acute renal failure without septic shock ([place])   DNR (do not resuscitate) discussion   Palliative care by specialist   Adult failure to thrive   Acute respiratory failure with hypoxia ([place])   Diverticulitis of sigmoid colon with abscess s/p colectomy/colostomy Jeanette Caprice) 04/18/2020   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 Bilateral ureteral obstruction   Procedure:  1. Cystoscopy 2. Bilateral ureteral stent replacement (RIGHT 6Fr x 26cm, LEFT 6Fr x 28cm) 3. Bilateral retrograde pyelogram  Surgeon: Louis Meckel, MD  Assessment  Recovering  [facility] Stay = 21 days)  Plan:  -ERAS protocol  -adv diet - solid renal diet  -mobilize.  Patient is trying to work with physical therapy more.  Nursing and nurse tech in room.  He noted he we will try and keep work with him.  -Concern for orthostasis.  IV fluid bolus.  Check orthostatic vital signs regularly.  Holding most of his blood pressure medicines.  Can hold even his Flomax as that may encourage orthostasis as well.  Ultimately defer to medicine.    -Daily patient having issues with glucocorticoid and mineralocorticoid insufficiency contributing to this?  Check PRA/cortisol.  Defer to medicine/nephrology/Urology for other workup  -Chronic anemia  without any postoperative major fall.  He does not seem to be iron deficient.  We will hold off on any more  IV iron.  He received that last hospitalization.  -IVF per Urology/primary service (typically medlock POD#1 from colon standpoint).  -f/u pathology -colon shows fibrosis but no active perforation nor malignancy.  Benign.  -colostomy care/teaching.  Patient resistant to learning and wishes it to be managed by his mother and [place] system.  Wound ostomy team skeptical that this will succeed.  I am skeptical as well.  See if we can convince him to do what we recommend and not fight on this  -perc nephrostomy/ureteral stents/foley per Urology = Dr Alinda Money.  Hopefully can get nephrostomy tubes out if stents working better.  Grossly there was no major CV fistula.  I think that is old contrast in the cecum & rectal stump.  D/w Dr Alinda Money - he is trying capping nephrostomy tubes  -personality disorder with intermittent medical AMA/noncompliance.  Probable PTSD and chronic depression given estranged sons and loss of life and daughter - per primary service.  ?SSRI?  -VTE prophylaxis- SCDs, etc  -mobilize as tolerated to help recovery  Disposition:  Disposition:  The patient is from: [place] discharge to:  [place]  Anticipated Date of Discharge is:  October 23, 2020  Barriers to discharge:  Pending Clinical improvement (more likely than not  Patient currently is NOT MEDICALLY STABLE for discharge from the hospital from a surgery standpoint.   30 minutes spent in review, evaluation, examination, counseling, and coordination of care.  More than 50% of that time was spent in counseling.  04/21/2020    Subjective: (Chief complaint)  No major events x orthostatic  Sore but pain controlled   Objective:  Vital signs:  Vitals:   04/21/20 0315 04/21/20 0423 04/21/20 0515 04/21/20 0622  BP: 111/80 (!) 69/48 (!) 94/57 100/68  Pulse: 88 83 78 67  Resp: 18 18  20   Temp: 98.2 F (36.8 C) 98.1 F (36.7 C)  98.8 F (37.1 C)  TempSrc: Oral Oral  Oral   SpO2: 98% 96%  99%  Weight:      Height:       Body mass index is 19.78 kg/m.   Last BM Date: 04/18/20  Intake/Output   Yesterday:  01/20 0701 - 01/21 0700 In: 1120 [P.O.:120; IV Piggyback:1000] Out: 1650 [Urine:1650] This shift:  No intake/output data recorded.  Bowel function:  Flatus: YES  BM:  YES     Physical Exam:  General: Pt awake/alert in no acute distress.  Tired but not toxic.  Remains very cachectic Eyes: PERRL, normal EOM.  Sclera clear.  No icterus Neuro: CN II-XII intact w/o focal sensory/motor deficits. Lymph: No head/neck/groin lymphadenopathy Psych:  No delerium/psychosis/paranoia.  Oriented x 4 HENT: Normocephalic, Mucus membranes moist.  No thrush Neck: Supple, No tracheal deviation.  No obvious thyromegaly Chest: No pain to chest wall compression.  Good respiratory excursion.  No audible wheezing CV:  Pulses intact.  Regular rhythm.  No major extremity edema MS: Normal AROM mjr joints.  No obvious deformity  Abdomen: Soft.  Nondistended.  Mildly tender at incisions only.  Colostomy purple w edema but viable.  No evidence of peritonitis.  No incarcerated hernias.  GU: foley in place.  No hernias Ext:  No deformity.  No mjr edema.  No cyanosis Skin: No petechiae / purpurea.  No major sores.  Warm and dry    Results:  Cultures: Recent Results (from the past 720 hour(s))  Urine culture     Status: Abnormal   Collection Time: 03/31/20  2:07 PM   Specimen: In/Out Cath Urine  Result Value Ref Range Status   Specimen Description   Final    IN/OUT CATH URINE Performed at [facility], [address] [place], [place] [address]    Special Requests   Final    NONE Performed at [facility], [address] [place], [place] [address]    Culture MULTIPLE SPECIES PRESENT, SUGGEST RECOLLECTION (A)  Final   Report Status 04/02/2020 FINAL  Final  Blood Culture (routine x 2)     Status: None   Collection Time: 03/31/20  2:20 PM   Specimen: BLOOD   Result Value Ref Range Status   Specimen Description BLOOD BLOOD LEFT FOREARM  Final   Special Requests   Final    BOTTLES DRAWN AEROBIC AND ANAEROBIC Blood Culture adequate volume   Culture   Final    NO GROWTH 5 DAYS Performed at [facility], [address], [place], [place] [address]    Report Status 04/05/2020 FINAL  Final  Blood Culture (routine x 2)     Status: None   Collection Time: 03/31/20  2:20 PM   Specimen: BLOOD  Result Value Ref Range Status   Specimen Description BLOOD LEFT ANTECUBITAL  Final   Special Requests   Final    BOTTLES DRAWN AEROBIC AND ANAEROBIC Blood Culture adequate volume   Culture   Final    NO GROWTH 5 DAYS Performed at [facility], [address] [place],  [address]    Report Status 04/05/2020 FINAL  Final  Resp Panel by RT-PCR (Flu A&B, Covid) Nasopharyngeal Swab     Status: None   Collection Time: 03/31/20  2:48 PM   Specimen: Nasopharyngeal Swab; Nasopharyngeal(NP) swabs in vial transport medium  Result Value Ref Range Status   SARS Coronavirus 2 by RT PCR NEGATIVE NEGATIVE Final    Comment: (NOTE) SARS-CoV-2 target nucleic acids are NOT DETECTED.  The SARS-CoV-2 RNA is generally detectable in upper respiratory specimens during the acute phase of infection. The lowest concentration of SARS-CoV-2 viral copies this assay can detect is 138 copies/mL. A negative result does not preclude SARS-Cov-2 infection and should not be used as the sole basis for treatment or other patient management decisions. A negative result may occur with  improper specimen collection/handling, submission of specimen other than nasopharyngeal swab, presence of viral mutation(s) within the areas targeted by this assay, and inadequate number of viral copies(<138 copies/mL). A negative result must be combined with clinical observations, patient history, and epidemiological information. The expected result is Negative.  Fact Sheet for Patients:   EntrepreneurPulse.com.au  Fact Sheet for Healthcare Providers:  IncredibleEmployment.be  This test is no t yet approved or cleared by the [place] FDA and  has been authorized for detection and/or diagnosis of SARS-CoV-2 by FDA under an Emergency Use Authorization (EUA). This EUA will remain  in effect (meaning this test can be used) for the duration of the COVID-19 declaration under Section 564(b)(1) of the Act, 21 U.S.C.section 360bbb-3(b)(1), unless the authorization is terminated  or revoked sooner.       Influenza A by PCR NEGATIVE NEGATIVE Final   Influenza B by PCR NEGATIVE NEGATIVE Final    Comment: (NOTE) The Xpert Xpress SARS-CoV-2/FLU/RSV plus assay is intended as an aid in the diagnosis of influenza from Nasopharyngeal swab specimens and should not be  used as a sole basis for treatment. Nasal washings and aspirates are unacceptable for Xpert Xpress SARS-CoV-2/FLU/RSV testing.  Fact Sheet for Patients: EntrepreneurPulse.com.au  Fact Sheet for Healthcare Providers: IncredibleEmployment.be  This test is not yet approved or cleared by the [place] FDA and has been authorized for detection and/or diagnosis of SARS-CoV-2 by FDA under an Emergency Use Authorization (EUA). This EUA will remain in effect (meaning this test can be used) for the duration of the COVID-19 declaration under Section 564(b)(1) of the Act, 21 U.S.C. section 360bbb-3(b)(1), unless the authorization is terminated or revoked.  Performed at [facility], [address] [place], [place] [address]   Urine Culture     Status: Abnormal   Collection Time: 03/31/20 10:27 PM   Specimen: In/Out Cath Urine  Result Value Ref Range Status   Specimen Description IN/OUT CATH URINE  Final   Special Requests   Final    NONE Performed at [facility], [place] [address], [place], [place] [address]    Culture MULTIPLE SPECIES  PRESENT, SUGGEST RECOLLECTION (A)  Final   Report Status 04/02/2020 FINAL  Final  Urine culture     Status: Abnormal   Collection Time: 04/01/20  1:57 PM   Specimen: Urine, Random  Result Value Ref Range Status   Specimen Description URINE, RANDOM  Final   Special Requests   Final    LEFT KIDNEY Performed at [facility], [place] [address] [place], [place] [address]    Culture MULTIPLE SPECIES PRESENT, SUGGEST RECOLLECTION (A)  Final   Report Status 04/02/2020 FINAL  Final  Surgical pcr screen     Status: None   Collection Time: 04/13/20  4:48 AM   Specimen: Nasal Mucosa; Nasal Swab  Result Value Ref Range Status   MRSA, PCR NEGATIVE NEGATIVE Final   Staphylococcus aureus NEGATIVE NEGATIVE Final    Comment: (NOTE) The Xpert SA Assay (FDA approved for NASAL specimens in patients 52 years of age and older), is one component of a comprehensive surveillance program. It is not intended to diagnose infection nor to guide or monitor treatment. Performed at [facility], [place] [address], [place], [place] [address]     Labs: Results for orders placed or performed during the hospital encounter of 03/31/20 (from the past 48 hour(s))  Glucose, capillary     Status: Abnormal   Collection Time: 04/19/20  8:42 AM  Result Value Ref Range   Glucose-Capillary 108 (H) 70 - 99 mg/dL    Comment: Glucose reference range applies only to samples taken after fasting for at least 8 hours.  Glucose, capillary     Status: None   Collection Time: 04/19/20 12:01 PM  Result Value Ref Range   Glucose-Capillary 76 70 - 99 mg/dL    Comment: Glucose reference range applies only to samples taken after fasting for at least 8 hours.  Glucose, capillary     Status: None   Collection Time: 04/19/20  4:14 PM  Result Value Ref Range   Glucose-Capillary 86 70 - 99 mg/dL    Comment: Glucose reference range applies only to samples taken after fasting for at least 8 hours.  Glucose, capillary     Status:  Abnormal   Collection Time: 04/19/20  8:15 PM  Result Value Ref Range   Glucose-Capillary 115 (H) 70 - 99 mg/dL    Comment: Glucose reference range applies only to samples taken after fasting for at least 8 hours.  Glucose, capillary  Status: None   Collection Time: 04/20/20  4:17 AM  Result Value Ref Range   Glucose-Capillary 80 70 - 99 mg/dL    Comment: Glucose reference range applies only to samples taken after fasting for at least 8 hours.  Glucose, capillary     Status: Abnormal   Collection Time: 04/20/20  7:18 AM  Result Value Ref Range   Glucose-Capillary 115 (H) 70 - 99 mg/dL    Comment: Glucose reference range applies only to samples taken after fasting for at least 8 hours.  Glucose, capillary     Status: Abnormal   Collection Time: 04/20/20 11:31 AM  Result Value Ref Range   Glucose-Capillary 139 (H) 70 - 99 mg/dL    Comment: Glucose reference range applies only to samples taken after fasting for at least 8 hours.  Glucose, capillary     Status: Abnormal   Collection Time: 04/20/20  4:23 PM  Result Value Ref Range   Glucose-Capillary 165 (H) 70 - 99 mg/dL    Comment: Glucose reference range applies only to samples taken after fasting for at least 8 hours.  Glucose, capillary     Status: Abnormal   Collection Time: 04/20/20  8:51 PM  Result Value Ref Range   Glucose-Capillary 125 (H) 70 - 99 mg/dL    Comment: Glucose reference range applies only to samples taken after fasting for at least 8 hours.  Glucose, capillary     Status: Abnormal   Collection Time: 04/21/20  4:09 AM  Result Value Ref Range   Glucose-Capillary 173 (H) 70 - 99 mg/dL    Comment: Glucose reference range applies only to samples taken after fasting for at least 8 hours.  Cortisol-am, blood     Status: None   Collection Time: 04/21/20  6:11 AM  Result Value Ref Range   Cortisol - AM 8.9 6.7 - 22.6 ug/dL    Comment: Performed at [facility], [place] [address], [place], [place] [address]     Imaging / Studies: CT CYSTOGRAM PELVIS  Result Date: 04/20/2020 CLINICAL DATA:  Vesicointestinal fistula. EXAM: CT CYSTOGRAM (CT PELVIS WITH CONTRAST) TECHNIQUE: Multidetector CT imaging through the pelvis was performed after dilute contrast had been introduced into the bladder for the purposes of performing CT cystography. CONTRAST:  50 mL OMNIPAQUE IOHEXOL 300 MG/ML  SOLN COMPARISON:  None. FINDINGS: Urinary Tract: Hand injection water-soluble contrast into the bladder via the Foley catheter. The bladder is distended. Contrast reflux into the ureters. Bilateral ureteral stents are present. Within the excluded rectal pouch (Hartmann's pouch) there is small amount of dependent high density contrast (image 29/3). No precontrast imaging was performed therefore the timing of the contrast entering the Hartmann's pouch is unclear. There is no direct communication identified however concern for colon leak. Within the RIGHT lower quadrant there is high-density material within a loop of colon (image 1/3). Bowel:  LEFT lower quadrant colostomy. Vascular/Lymphatic: Lymphadenopathy Reproductive:  Prostate unremarkable Other:  Subcutaneous gas in chest wall related to recent surgery. Musculoskeletal: No aggressive osseous lesion IMPRESSION: 1. High-density material within the excluded rectum (Hartmann's pouch) may represent communication with the bladder currently or in the recent past. Percutaneous nephrograms performed 04/18/2020. No direct communication identified currently. 2. Second focus of high-density material within the bowel of the RIGHT lower quadrant. Unclear timing of contrast accumulation but also concerning for communication with the bladder. 3. Difficult to ascertain timing of high-density material accumulation in bowel as there was precontrast imaging prior to the administration  of the bladder contrast. 4. The bladder is well distended with reflux into the ureters. NO active leaking identified however  concern for occult leak as described above. Electronically Signed   By: Suzy Bouchard M.D.   On: 04/20/2020 20:03    Medications / Allergies: per chart  Antibiotics: Anti-infectives (From admission, onward)   Start     Dose/Rate Route Frequency Ordered Stop   04/19/20 0200  cefoTEtan (CEFOTAN) 2 g in sodium chloride 0.9 % 100 mL IVPB        2 g 200 mL/hr over 30 Minutes Intravenous Every 12 hours 04/18/20 1824 04/19/20 0238   04/18/20 1200  cefoTEtan (CEFOTAN) 2 g in sodium chloride 0.9 % 100 mL IVPB        2 g 200 mL/hr over 30 Minutes Intravenous To ShortStay Surgical 04/17/20 2206 04/18/20 1410   04/17/20 2215  neomycin (MYCIFRADIN) tablet 1,000 mg       "And" Linked Group Details   1,000 mg Oral  Once 04/17/20 2206 04/17/20 2348   04/17/20 2215  metroNIDAZOLE (FLAGYL) tablet 1,000 mg       "And" Linked Group Details   1,000 mg Oral  Once 04/17/20 2206 04/17/20 2336   04/13/20 1145  cefoTEtan (CEFOTAN) 2 g in sodium chloride 0.9 % 100 mL IVPB        2 g 200 mL/hr over 30 Minutes Intravenous On call to O.R. 04/12/20 2006 04/14/20 0559   04/12/20 2100  neomycin (MYCIFRADIN) tablet 1,000 mg       "And" Linked Group Details   1,000 mg Oral 3 times per day 04/12/20 2006 04/13/20 0527   04/12/20 2100  metroNIDAZOLE (FLAGYL) tablet 1,000 mg       "And" Linked Group Details   1,000 mg Oral 3 times per day 04/12/20 2006 04/13/20 0527   04/03/20 1600  ceFEPIme (MAXIPIME) 1 g in sodium chloride 0.9 % 100 mL IVPB        1 g 200 mL/hr over 30 Minutes Intravenous Every 24 hours 04/03/20 0918 04/07/20 1108   04/01/20 1600  ceFEPIme (MAXIPIME) 1 g in sodium chloride 0.9 % 100 mL IVPB  Status:  Discontinued        1 g 200 mL/hr over 30 Minutes Intravenous Every 24 hours 03/31/20 1550 04/03/20 0918   04/01/20 1145  cefTRIAXone (ROCEPHIN) 1 g in sodium chloride 0.9 % 100 mL IVPB        1 g 200 mL/hr over 30 Minutes Intravenous  Once 04/01/20 1057 04/01/20 1308   03/31/20 1415  ceFEPIme  (MAXIPIME) 2 g in sodium chloride 0.9 % 100 mL IVPB        2 g 200 mL/hr over 30 Minutes Intravenous  Once 03/31/20 1407 03/31/20 1516        Note: Portions of this report may have been transcribed using voice recognition software. Every effort was made to ensure accuracy; however, inadvertent computerized transcription errors may be present.   Any transcriptional errors that result from this process are unintentional.    Adin Hector, MD, FACS, MASCRS Gastrointestinal and Minimally Invasive Surgery  [facility] [address] [place], [place] [address] [phone] Fax [phone] Main/Paging  CONTACT INFORMATION: Weekday (9AM-5PM) concerns: Call [facility] main office at [phone] Weeknight (5PM-9AM) or Weekend/Holiday concerns: Check [email or web address] for General Surgery [facility] coverage (Please, do not use SecureChat as it is not reliable communication to operating surgeons for immediate patient care)  04/21/2020  7:58 AM

## 2020-04-21 NOTE — TOC Progression Note (Addendum)
Transition of Care (TOC) - Progression Note    Patient Details  Name: MEREK NIU Sr. MRN: [id number] Date of Birth: December 27, 1965  Transition of Care [facility]) CM/SW [place], RN Phone Number: 04/21/2020, 8:57 AM  Clinical Narrative:    Case management faxed patient out in hub to [facility] in [place], [place] and message with Duffy Rhody, CM at the facility to assess the patient for admission, considering patient is insured with [employer].  04/21/2020 1237 - I called and left a message with Tally Due, RNCM at [facility] in [place], [place] and faxed clinicals in reference to possible admission and bed offer at the facility.   Both CIR in [place] and [facility] are in network with [facility] coverage.  CM and MSW will continue to follow the patient for possible placement in CIR versus SNF rehabilitation.   Expected Discharge Plan: Skilled Nursing Facility Barriers to Discharge: No SNF bed  Expected Discharge Plan and Services Expected Discharge Plan: [place] arrangements for the past 2 months: Apartment                                       Social Determinants of Health (SDOH) Interventions    Readmission Risk Interventions Readmission Risk Prevention Plan 12/25/2019 11/19/2019  Transportation Screening Complete Complete  Medication Review Press photographer) Complete Complete  PCP or Specialist appointment within 3-5 days of discharge - Complete  HRI or [place] - Complete  SW Recovery Care/Counseling Consult - Complete  [place] - Complete  Some recent data might be hidden

## 2020-04-21 NOTE — Progress Notes (Signed)
°   04/21/20 0423  Assess: MEWS Score  Temp 98.1 F (36.7 C)  BP (!) 69/48  Pulse Rate 83  Resp 18  Level of Consciousness Alert  SpO2 96 %  O2 Device Room Air  Assess: MEWS Score  MEWS Temp 0  MEWS Systolic 3  MEWS Pulse 0  MEWS RR 0  MEWS LOC 0  MEWS Score 3  MEWS Score Color Yellow  Assess: if the MEWS score is Yellow or Red  Were vital signs taken at a resting state? Yes  Focused Assessment Change from prior assessment (see assessment flowsheet)  Early Detection of Sepsis Score *See Row Information* Low  MEWS guidelines implemented *See Row Information* Yes  Treat  Pain Scale 0-10  Pain Score 0  Take Vital Signs  Increase Vital Sign Frequency  Yellow: Q 2hr X 2 then Q 4hr X 2, if remains yellow, continue Q 4hrs  Escalate  MEWS: Escalate Yellow: discuss with charge nurse/RN and consider discussing with provider and RRT  Notify: Charge Nurse/RN  Name of Charge Nurse/RN Notified Nancy C.  Date Charge Nurse/RN Notified 04/21/20  Time Charge Nurse/RN Notified 0425  Notify: Provider  Provider Name/Title M. Denny,NP  Date Provider Notified 04/21/20  Time Provider Notified [phone]  Notification Type Page  Notification Reason Change in status  Response No new orders  Date of Provider Response 04/21/20  Time of Provider Response [phone]  Document  Progress note created (see row info) Yes

## 2020-04-21 NOTE — Progress Notes (Signed)
Patient ID: Chase [name]., male   DOB: 1965-05-23, 55 y.o.   MRN: [id number]  . 3 Days Post-Op Subjective: Bilateral nephrostomy tubes capped yesterday.  No increased flank pain.  Objective: Vital signs in last 24 hours: Temp:  [98.1 F (36.7 C)-99.5 F (37.5 C)] 98.3 F (36.8 C) (01/21 1700) Pulse Rate:  [67-88] 81 (01/21 1623) Resp:  [15-20] 15 (01/21 1623) BP: (69-129)/(48-90) 118/79 (01/21 1700) SpO2:  [96 %-99 %] 98 % (01/21 1623)  Intake/Output from previous day: 01/20 0701 - 01/21 0700 In: 1120 [P.O.:120; IV Piggyback:1000] Out: 1650 [Urine:1650] Intake/Output this shift: Total I/O In: 300 [P.O.:300] Out: 550 [Urine:550]  Physical Exam:  General: Alert and oriented GU: Urine slightly red but draining well.  Lab Results: Recent Labs    04/19/20 0057 04/21/20 0628  HGB 9.1* 7.5*  HCT 28.9* 24.9*   CBC Latest Ref Rng & Units 04/21/2020 04/19/2020 04/18/2020  WBC 4.0 - 10.5 K/uL 6.6 7.9 -  Hemoglobin 13.0 - 17.0 g/dL 7.5(L) 9.1(L) 11.2(L)  Hematocrit 39.0 - 52.0 % 24.9(L) 28.9(L) 33.0(L)  Platelets 150 - 400 K/uL 287 349 -     BMET Recent Labs    04/19/20 0057 04/21/20 0628  NA 135 138  K 4.2 3.8  CL 103 106  CO2 20* 23  GLUCOSE 141* 90  BUN 25* 23*  CREATININE 4.64* 4.31*  CALCIUM 8.2* 7.5*     Studies/Results: CT CYSTOGRAM PELVIS  Result Date: 04/20/2020 CLINICAL DATA:  Vesicointestinal fistula. EXAM: CT CYSTOGRAM (CT PELVIS WITH CONTRAST) TECHNIQUE: Multidetector CT imaging through the pelvis was performed after dilute contrast had been introduced into the bladder for the purposes of performing CT cystography. CONTRAST:  50 mL OMNIPAQUE IOHEXOL 300 MG/ML  SOLN COMPARISON:  None. FINDINGS: Urinary Tract: Hand injection water-soluble contrast into the bladder via the Foley catheter. The bladder is distended. Contrast reflux into the ureters. Bilateral ureteral stents are present. Within the excluded rectal pouch (Hartmann's pouch) there is small  amount of dependent high density contrast (image 29/3). No precontrast imaging was performed therefore the timing of the contrast entering the Hartmann's pouch is unclear. There is no direct communication identified however concern for colon leak. Within the RIGHT lower quadrant there is high-density material within a loop of colon (image 1/3). Bowel:  LEFT lower quadrant colostomy. Vascular/Lymphatic: Lymphadenopathy Reproductive:  Prostate unremarkable Other:  Subcutaneous gas in chest wall related to recent surgery. Musculoskeletal: No aggressive osseous lesion IMPRESSION: 1. High-density material within the excluded rectum (Hartmann's pouch) may represent communication with the bladder currently or in the recent past. Percutaneous nephrograms performed 04/18/2020. No direct communication identified currently. 2. Second focus of high-density material within the bowel of the RIGHT lower quadrant. Unclear timing of contrast accumulation but also concerning for communication with the bladder. 3. Difficult to ascertain timing of high-density material accumulation in bowel as there was precontrast imaging prior to the administration of the bladder contrast. 4. The bladder is well distended with reflux into the ureters. NO active leaking identified however concern for occult leak as described above. Electronically Signed   By: Suzy Bouchard M.D.   On: 04/20/2020 20:03    Assessment/Plan: 1) Colovesical fistula:  CT cystogram reviewed.  No clear fistulous tract seen but contrast noted in rectal stump without clear explanation.  Will consider repeat study before and after bladder contrast in about 10-14 days for further evaluation. 2) Bilateral ureteral obstruction: Bilateral nephrostomy tubes capped.  Renal function stable/slightly improved.  Will monitor over  the weekend and if no worsening of renal function, will plan to have removed in IR on Monday under fluoroscopy to ensure stents remain in appropriate  position. 3) Bladder outlet obstruction:  Will address this last after addressing above issues.  Continue tamsulosin and will eventually hopefull proceed with a voiding trial (either as inpatient or outpatient).  Likely will delay this until issue of fistula is definitively determined.    LOS: 21 days   [name] 04/21/2020, 6:02 PM

## 2020-04-21 NOTE — Progress Notes (Signed)
Physical Therapy Treatment Patient Details Name: Chase ANTONINI Sr. 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 He was admitted for acute renal failure complicating stage V CKD secondary to urinary obstruction and dehydration with severe electrolyte derangements.  Urology was consulted.  Patient underwent hemodialysis. s/p ureteral stent placement, retrograde pyelogram, lap sigmoid colectomy with colostomy, and epigastric hernia repair on 1/18.    PT Comments    Patient continues to be limited by orthostatic hypotension. Improved this session from last night when attempting to get OOB with nursing. Patient with complaints of mild dizziness with transitions. Patient requires minA for sit to stand transfer for boost up with RW. Patient ambulated 2' towards recliner with RW and min guard. Patient is also limited by decreased activity tolerance, impaired balance, generalized weakness. Continue to recommend SNF for ongoing Physical Therapy.     Orthostatic BPs  Supine 119/83 HR 89  Sitting 94/75 HR 111  Sitting after 3 min 112/82 HR 103  Standing 90/60 HR 124  Sitting following transfer 107/89 HR 108       Follow Up Recommendations  SNF     Equipment Recommendations  None recommended by PT    Recommendations for Other Services       Precautions / Restrictions Precautions Precautions: Fall Precaution Comments: x2 nephrostomy bagsand colostomy; orthostatic HTN Restrictions Weight Bearing Restrictions: No    Mobility  Bed Mobility Overal bed mobility: Needs Assistance Bed Mobility: Supine to  Sit     Supine to sit: Min assist        Transfers Overall transfer level: Needs assistance Equipment used: Rolling Novella Abraha (2 wheeled) Transfers: Sit to/from Stand Sit to Stand: Min assist         General transfer comment: minA for boost up  Ambulation/Gait Ambulation/Gait assistance: Min guard Gait Distance (Feet): 2 Feet Assistive device: Rolling Levada Bowersox (2 wheeled) Gait Pattern/deviations: Step-to pattern;Decreased step length - right;Decreased step length - left;Trunk flexed Gait velocity: decr   General Gait Details: steps towards chair, min guard for safety. Further gait deferred due to complaints of mild dizziness and noted orthostatic hypotension   Stairs             Wheelchair Mobility    Modified Rankin (Stroke Patients Only)       Balance Overall balance assessment: Needs assistance Sitting-balance support: No upper extremity supported Sitting balance-Leahy Scale: Fair     Standing balance support: Bilateral upper extremity supported Standing balance-Leahy Scale: Poor Standing balance comment: reliant on external assist                            Cognition Arousal/Alertness: Awake/alert Behavior During Therapy: WFL for tasks assessed/performed Overall Cognitive Status: Within Functional Limits for tasks assessed                                 General Comments: Continues to want to work to get better and stronger--limited by orthostatic HTN again today      Exercises      General Comments General comments (skin integrity, edema,  etc.): see flowsheet and note for orthostatics      Pertinent Vitals/Pain Pain Assessment: Faces Faces Pain Scale: Hurts little more Pain Location: generalized Pain Descriptors / Indicators: Grimacing;Discomfort Pain Intervention(s): Limited activity within patient's tolerance;Monitored during session    Home Living                      Prior Function            PT  Goals (current goals can now be found in the care plan section) Acute Rehab PT Goals Patient Stated Goal: less pain and be able to move without getting dizzy PT Goal Formulation: With patient Time For Goal Achievement: 05/05/20 Potential to Achieve Goals: Fair Progress towards PT goals: Not progressing toward goals - comment (limited by orthostatic hypotension)    Frequency    Min 3X/week      PT Plan Current plan remains appropriate    Co-evaluation              AM-PAC PT "6 Clicks" Mobility   Outcome Measure  Help needed turning from your back to your side while in a flat bed without using bedrails?: A Little Help needed moving from lying on your back to sitting on the side of a flat bed without using bedrails?: A Little Help needed moving to and from a bed to a chair (including a wheelchair)?: A Little Help needed standing up from a chair using your arms (e.g., wheelchair or bedside chair)?: A Little Help needed to walk in hospital room?: A Little Help needed climbing 3-5 steps with a railing? : A Lot 6 Click Score: 17    End of Session Equipment Utilized During Treatment: Gait belt Activity Tolerance: Patient tolerated treatment well Patient left: in chair;with call bell/phone within reach;with chair alarm set Nurse Communication: Mobility status;Patient requests pain meds PT Visit Diagnosis: Unsteadiness on feet (R26.81);Difficulty in walking, not elsewhere classified (R26.2);Muscle weakness (generalized) (M62.81)     Time: 9326-7124 PT Time Calculation (min) (ACUTE ONLY): 23 min  Charges:  $Therapeutic Activity: 23-37 mins                     Evaleen Sant A. Gilford Rile PT, DPT Acute Rehabilitation Services Pager [phone] Office [phone]    Alda Lea 04/21/2020, 3:18 PM

## 2020-04-21 NOTE — Progress Notes (Addendum)
[facility] PROGRESS NOTE  Charleton Deyoung FIE:[id number] DOB: 27-Dec-1965 DOA: 03/31/2020 PCP: [name]      Status: Remains inpatient appropriate because:Unsafe d/c plan and Inpatient level of care appropriate due to severity of illness   Dispo: The patient is from: Home              Anticipated d/c is to: SNF              Anticipated d/c date is: > 3 days              Patient currently is not medically stable to d/c. Barriers to discharge: Status post diverting loop colostomy as well as internal stent placement, PT recommending SNF at present we have not received any bed offers from [place] affiliated facilities   Code Status: Full code-1/20 d/w pt DNR option-explained clinically given severe deconditioning and malnutrition along w/ multiple ongoing medical issues he would likely not survive a mjor medical vent that would require resuscitation-said wants to d/w sons Family Communication: Patient; at length with son [name] on 1/19. DVT prophylaxis: SQ heparin Vaccination status: Did receive 1 dose of the Moderna COVID-vaccine.  Subsequently refusing any future doses or even a booster shot.  States he had COVID 3 months ago and has reduced immune system and does not feel these immunizations are effective for him.  Foley catheter: Presented with chronic Foley catheter, also has bilateral percutaneous nephrostomy tubes  HPI: HPI on 03/31/2020 by Dr. 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 Patient was reportedly seen by neighbors yesterday in a recliner and was then found on the floor today obtunded. He was sent to [facility] emergency department and is unable to provide history due to his clinical condition.  Interim history Admitted with acute renal failure on chronic kidney disease, stage V secondary to urinary obstruction and dehydration with electrolyte abnormalities.  Nephrology consulted.  Patient did undergo hemodialysis.  IR placed bilateral percutaneous nephrostomy tubes.  Creatinine did improve with a couple cycles of hemodialysis however plateaued.  Hemodialysis catheter was then removed.  No further plans for hemodialysis at this time.  Nephrology feels that patient can be discharged home with outpatient follow-up.  At this time, discharge is complicated by patient being extremely weak and deconditioned.  Initially patient did not want to be discharged to SNF however this morning he states that he will consider it.   Subjective: Awake and smiling.  States pain better controlled and appetite has improved.  Now is quite hungry.  States nephrostomy tubes clamped yesterday by urologist.  Objective: Vitals:   04/21/20 0515 04/21/20 0622  BP: (!) 94/57 100/68  Pulse: 78 67  Resp:  20  Temp:  98.8 F (37.1 C)  SpO2:  99%    Intake/Output Summary (Last 24 hours) at 04/21/2020 0752 Last data filed at 04/21/2020 1660 Gross per 24 hour  Intake 1120 ml  Output 1650 ml  Net -530 ml   Filed Weights   04/17/20 0511 04/18/20 0610 04/20/20 0500  Weight: 50.4 kg  52.4 kg 55.6 kg    Exam: Constitutional: Cachectic in appearance, calm no acute distress Respiratory: Bilateral lung sounds clear to auscultation, stable on room air, Cardiovascular: BP stable, pulses regular and nontachycardic, normal heart sounds, extremities warm to touch with adequate capillary refill Abdomen: Colostomy stoma pink, abdomen soft  nontender with normoactive bowel sounds Genitourinary: Bilateral percutaneous nephrostomy tubes in place draining clear yellow urine to collection bag; also has chronic Foley catheter draining yellow urine to bedside bag Neurologic: CN 2-12 grossly intact. Sensation intact, DTR normal. Strength 5/5 x all 4 extremities.  Psychiatric: Normal judgment and insight. Alert and oriented x 3.  Flat mood and affect.    Assessment/Plan: Acute problems: Acute renal failure on chronic kidney disease, stage V -Nephrology consulted and appreciated -Creatinine 5.76 in October 2021, although patient presented with a creatinine of 16.69 -Chronic kidney disease 2/2 longstanding obstruction.  Acute kidney injury due to dehydration and sepsis along with ATN with obstruction. -Interventional radiology consulted for bilateral percutaneous nephrostomy tube placement, 04/01/2020 as well as temporary HD catheter placement. -Patient did undergo hemodialysis, 2 cycles with last hemodialysis on 04/04/2019. -Creatinine has now plateaued, currently 5.08 -Though creatinine is still elevated, it is below his baseline.  Patient himself is unsure about wanting continuous HD.   -Nephrology recommended that patient follow-up with nephrology as an outpatient.   Recommended continuing bicarb on discharge.  Recurrent orthostasis -Likely from Flomax-has chronic Foley catheter therefore do not see any harm at this juncture w/ discontinuing Flomax -General surgery has ordered cosyntropin stimulation test which is normal -Recommend checking orthostatic vital signs every shift  Bilateral hydronephrosis s/p cystoscopy,B ureteral stent placement w/ B retrograde pyelogram -Per IR follow-up on 04/07/2019, patient had urinary obstruction secondary to pelvic mass with subsequent bilateral hydronephrosis in the setting of bilateral ureteral stents, bilateral PCNs inadvertently removed (unknown how long they have been out), status post bilateral  PCN placement on 04/01/2020. -1/18 Dr Alinda Money performing concurrent procedure with surgical team (cystoscopy and bilateral ureteral stent change) as of 1/20 nephrostomy tubes have been clamped and if stent placement remained stable with adequate urinary output through Foley anticipate discontinuation of nephrostomy tubes soon -Presented w/ chronic Foley catheter as well. -Also has history of Hodgkin's lymphoma and is status postchemotherapy and radiation which may have also contributed to recurrent obstructive renal disease  History of pelvic abscess/known colovesical fistula s/p lap sigmoid colectomy w/epigastric hernia repair (1/18) -Noted on 11/15/2019, status post left transgluteal drain by IR/colovesicular fistula -1/18 she will undergo diverting loop colostomy by [name] in conjunction with urological procedure by Dr. Alinda Money as described above -Primary GI is Dr. Rush Landmark. Dr Carlean Purl plans on preop colonoscopy 1/18 (today) -Cont scheduled Zofran for recurrent nausea  Goals of care -Palliative care consulted and appreciated -Patient full code and refused to discuss any decisions regarding plan of care -Patient should follow up with palliative care as an outpatient -1/19 D/w with patient's son [name].  Recommendation is to have eventual discussion with patient and if necessary family regarding instituting DNR status.  It is felt by the medical team that patient is so poorly conditioned that he would not tolerate a resuscitative event and likely would not survive.  Emphasis would be on DNR clarification and not comfort care since at this juncture patient appears to be improving slightly although long-term prognosis is not good.  1/20 also discussed with patient and he agrees DNR at this juncture would be warranted given his poor status noting he would not likely  survive a resuscitation event.  He wishes to discuss with family first  Severe physical deconditioning, history of prior CVA -July 2021,  history of atrial fibrillation, patient is not a candidate for anticoagulation due to GI bleed and thrombocytopenia -Currently no focal deficits  Severe protein calorie malnutrition Nutrition Problem: Severe Malnutrition Etiology: chronic illness (colovesical fistula) Signs/Symptoms: moderate fat depletion,severe fat depletion,moderate muscle depletion,severe muscle depletion Interventions: MVI,Refer to RD note for recommendations Estimated body mass index is 19.78 kg/m as calculated from the following:   Height as of this encounter: 5\' 6"  (1.676 m).   Weight as of this encounter: 55.6 kg. -Postoperatively patient is reporting increased appetite.  Continue to follow intake.  May require calorie count Chronic pain -Pt was taking a massive dose of Neurontin PTA (800 mg TID)-with his low GFR rec is for 200 mg/day- d/w pharmacist and will proceed w/ 100 mg TID  -Continue low-dose Zanaflex   Other problems: Severe dehydration with multiple metabolic derangements as follows: Severe metabolic acidosis, hyperkalemia, hyperphosphatemia -Resolved after rehydration, dialysis and Kayexalate  Severe sepsis secondary to urinary tract infection -Present on admission-sepsis physiology has now resolved -Patient presented with encephalopathy as well as hypothermia and leukocytosis -Blood cultures were negative x2 -Urine culture from 12/31 and 1/1 showed multiple species -CT renal stone study 12/31: Bladder wall appeared slightly thickened.  No significant fluid collection in pelvis -Patient completed 7 days of IV cefepime  PTSD -Sedatives held  History of gastric bypass  -Avoid NSAIDs (unable to use currently in context of chronic kidney disease)  Data Reviewed: Basic Metabolic Panel: Recent Labs  Lab 04/17/20 0307 04/18/20 1205 04/19/20 0057  NA 137 136 135  K 4.6 4.2 4.2  CL 103 105 103  CO2 25  --  20*  GLUCOSE 95 82 141*  BUN 29* 29* 25*  CREATININE 4.97* 4.80* 4.64*  CALCIUM  8.3*  --  8.2*   Liver Function Tests: Recent Labs  Lab 04/17/20 0307  AST 33  ALT 26  ALKPHOS 122  BILITOT 0.5  PROT 6.4*  ALBUMIN 2.3*   No results for input(s): LIPASE, AMYLASE in the last 168 hours. No results for input(s): AMMONIA in the last 168 hours. CBC: Recent Labs  Lab 04/17/20 0307 04/18/20 1205 04/19/20 0057  WBC 6.8  --  7.9  HGB 9.4* 11.2* 9.1*  HCT 31.3* 33.0* 28.9*  MCV 97.5  --  96.3  PLT 341  --  349   Cardiac Enzymes: No results for input(s): CKTOTAL, CKMB, CKMBINDEX, TROPONINI in the last 168 hours. BNP (last 3 results) No results for input(s): BNP in the last 8760 hours.  ProBNP (last 3 results) No results for input(s): PROBNP in the last 8760 hours.  CBG: Recent Labs  Lab 04/20/20 0718 04/20/20 1131 04/20/20 1623 04/20/20 2051 04/21/20 0409  GLUCAP 115* 139* 165* 125* 173*    Recent Results (from the past 240 hour(s))  Surgical pcr screen     Status: None   Collection Time: 04/13/20  4:48 AM   Specimen: Nasal Mucosa; Nasal Swab  Result Value Ref Range Status   MRSA, PCR NEGATIVE NEGATIVE Final   Staphylococcus aureus NEGATIVE NEGATIVE Final    Comment: (NOTE) The Xpert SA Assay (FDA approved for NASAL specimens in patients 14 years of age and older), is one component of a comprehensive surveillance program. It is not intended to diagnose infection nor to guide or monitor treatment. Performed at [facility], [place] [address] [place], [place]  [address]      Studies: CT CYSTOGRAM PELVIS  Result Date: 04/20/2020 CLINICAL DATA:  Vesicointestinal fistula. EXAM: CT CYSTOGRAM (CT PELVIS WITH CONTRAST) TECHNIQUE: Multidetector CT imaging through the pelvis was performed after dilute contrast had been introduced into the bladder for the purposes of performing CT cystography. CONTRAST:  50 mL OMNIPAQUE IOHEXOL 300 MG/ML  SOLN COMPARISON:  None. FINDINGS: Urinary Tract: Hand injection water-soluble contrast into the bladder via the  Foley catheter. The bladder is distended. Contrast reflux into the ureters. Bilateral ureteral stents are present. Within the excluded rectal pouch (Hartmann's pouch) there is small amount of dependent high density contrast (image 29/3). No precontrast imaging was performed therefore the timing of the contrast entering the Hartmann's pouch is unclear. There is no direct communication identified however concern for colon leak. Within the RIGHT lower quadrant there is high-density material within a loop of colon (image 1/3). Bowel:  LEFT lower quadrant colostomy. Vascular/Lymphatic: Lymphadenopathy Reproductive:  Prostate unremarkable Other:  Subcutaneous gas in chest wall related to recent surgery. Musculoskeletal: No aggressive osseous lesion IMPRESSION: 1. High-density material within the excluded rectum (Hartmann's pouch) may represent communication with the bladder currently or in the recent past. Percutaneous nephrograms performed 04/18/2020. No direct communication identified currently. 2. Second focus of high-density material within the bowel of the RIGHT lower quadrant. Unclear timing of contrast accumulation but also concerning for communication with the bladder. 3. Difficult to ascertain timing of high-density material accumulation in bowel as there was precontrast imaging prior to the administration of the bladder contrast. 4. The bladder is well distended with reflux into the ureters. NO active leaking identified however concern for occult leak as described above. Electronically Signed   By: Suzy Bouchard M.D.   On: 04/20/2020 20:03    Scheduled Meds: . sodium chloride   Intravenous Once  . acetaminophen  500 mg Oral Q6H  . alvimopan  12 mg Oral BID  . calcium carbonate  1 tablet Oral TID  . Chlorhexidine Gluconate Cloth  6 each Topical Daily  . cholecalciferol  1,000 Units Oral Daily  . DULoxetine  60 mg Oral Daily   And  . DULoxetine  30 mg Oral QHS  . feeding supplement  237 mL Oral BID  BM  . gabapentin  300 mg Oral QHS  . heparin  5,000 Units Subcutaneous Q8H  . lip balm  1 application Topical BID  . multivitamin with minerals  1 tablet Oral BID  . ondansetron  4 mg Oral Q6H   Or  . ondansetron (ZOFRAN) IV  4 mg Intravenous Q6H  . pantoprazole  40 mg Oral QHS  . polycarbophil  625 mg Oral BID  . sodium bicarbonate  1,300 mg Oral TID  . sodium chloride flush  3 mL Intravenous Q12H  . sodium chloride flush  5 mL Intracatheter Q8H   Continuous Infusions: . sodium chloride    . lactated ringers 1,000 mL (04/21/20 0423)    Active Problems:   CKD (chronic kidney disease) stage 5, GFR less than 15 ml/min (HCC)   Chronic pain   Protein-calorie malnutrition, severe   Pelvic abscess with colovescial fistula   Acute kidney failure (HCC)   History of COVID-19   History of gastric bypass   Hodgkin lymphoma, unspecified, unspecified site (HCC)   Metabolic acidosis   Pernicious anemia   Toxic metabolic encephalopathy   PTSD (post-traumatic stress disorder)   Bilateral ureteral obstruction s/p perc nephrosotmy & stenting   Anxiety and depression  History of CVA (cerebrovascular accident)   BMI less than 19,adult   Chronic narcotic use   Anemia in chronic kidney disease   Incisional hernia - epigastric   Sepsis with acute renal failure without septic shock (HCC)   DNR (do not resuscitate) discussion   Palliative care by specialist   Adult failure to thrive   Acute respiratory failure with hypoxia ([facility])   Diverticulitis of sigmoid colon with abscess s/p colectomy/colostomy Jeanette Caprice) 04/18/2020   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[employer])   Personality disorder    Cachexia ([place])   Consultants:    Procedures:    Antibiotics: Anti-infectives (From admission, onward)   Start     Dose/Rate Route Frequency Ordered Stop   04/19/20 0200  cefoTEtan (CEFOTAN) 2 g in sodium chloride 0.9 % 100 mL IVPB        2 g 200 mL/hr over 30 Minutes Intravenous Every 12 hours 04/18/20 1824  04/19/20 0238   04/18/20 1200  cefoTEtan (CEFOTAN) 2 g in sodium chloride 0.9 % 100 mL IVPB        2 g 200 mL/hr over 30 Minutes Intravenous To ShortStay [facility] 04/17/20 2206 04/18/20 1410   04/17/20 2215  neomycin (MYCIFRADIN) tablet 1,000 mg       "And" Linked Group Details   1,000 mg Oral  Once 04/17/20 2206 04/17/20 2348   04/17/20 2215  metroNIDAZOLE (FLAGYL) tablet 1,000 mg       "And" Linked Group Details   1,000 mg Oral  Once 04/17/20 2206 04/17/20 2336   04/13/20 1145  cefoTEtan (CEFOTAN) 2 g in sodium chloride 0.9 % 100 mL IVPB        2 g 200 mL/hr over 30 Minutes Intravenous On call to O.R. 04/12/20 2006 04/14/20 0559   04/12/20 2100  neomycin (MYCIFRADIN) tablet 1,000 mg       "And" Linked Group Details   1,000 mg Oral 3 times per day 04/12/20 2006 04/13/20 0527   04/12/20 2100  metroNIDAZOLE (FLAGYL) tablet 1,000 mg       "And" Linked Group Details   1,000 mg Oral 3 times per day 04/12/20 2006 04/13/20 0527   04/03/20 1600  ceFEPIme (MAXIPIME) 1 g in sodium chloride 0.9 % 100 mL IVPB        1 g 200 mL/hr over 30 Minutes Intravenous Every 24 hours 04/03/20 0918 04/07/20 1108   04/01/20 1600  ceFEPIme (MAXIPIME) 1 g in sodium chloride 0.9 % 100 mL IVPB  Status:  Discontinued        1 g 200 mL/hr over 30 Minutes Intravenous Every 24 hours 03/31/20 1550 04/03/20 0918   04/01/20 1145  cefTRIAXone (ROCEPHIN) 1 g in sodium chloride 0.9 % 100 mL IVPB        1 g 200 mL/hr over 30 Minutes Intravenous  Once 04/01/20 1057 04/01/20 1308   03/31/20 1415  ceFEPIme (MAXIPIME) 2 g in sodium chloride 0.9 % 100 mL IVPB        2 g 200 mL/hr over 30 Minutes Intravenous  Once 03/31/20 1407 03/31/20 1516       Time spent:  20 minutes    Erin Hearing ANP  [facility] 7 am - 330 pm/M-F for direct patient care and secure chat Please refer to Amion for contact info 21  days  Patient seen and examined, agree with assessment and plan per Ms. Lissa Merlin.  Awaiting skilled nursing  facility, high risk of readmission and unsafe for discharge home. Creatinine has plateaued and  improving, appreciate nephrology and urology following.  Status post PCN, HD x2.  General surgery following, status post lap sigmoid colectomy with epigastric hernia repair.  Complaining of being "sore" and does not like food, otherwise no acute ongoing issues.   Estill Cotta M.D.  [facility] 04/21/2020, 1:30 PM

## 2020-04-22 DIAGNOSIS — N135 Crossing vessel and stricture of ureter without hydronephrosis: Secondary | ICD-10-CM | POA: Diagnosis not present

## 2020-04-22 HISTORY — PX: COLOVESICAL REPAIR: SHX6821

## 2020-04-22 LAB — BASIC METABOLIC PANEL
Anion gap: 11 (ref 5–15)
BUN: 26 mg/dL — ABNORMAL HIGH (ref 6–20)
CO2: 21 mmol/L — ABNORMAL LOW (ref 22–32)
Calcium: 7.6 mg/dL — ABNORMAL LOW (ref 8.9–10.3)
Chloride: 106 mmol/L (ref 98–111)
Creatinine, Ser: 4.46 mg/dL — ABNORMAL HIGH (ref 0.61–1.24)
GFR, Estimated: 15 mL/min — ABNORMAL LOW (ref >60)
Glucose, Bld: 91 mg/dL (ref 70–99)
Potassium: 3.8 mmol/L (ref 3.5–5.1)
Sodium: 138 mmol/L (ref 135–145)

## 2020-04-22 LAB — TYPE AND SCREEN
ABO/RH(D): O POS
Antibody Screen: NEGATIVE
Unit division: 0
Unit division: 0

## 2020-04-22 LAB — BPAM RBC
Blood Product Expiration Date: 202202172359
Blood Product Expiration Date: 202202182359
Unit Type and Rh: 5100
Unit Type and Rh: 5100

## 2020-04-22 LAB — GLUCOSE, CAPILLARY
Glucose-Capillary: 97 mg/dL (ref 70–99)
Glucose-Capillary: 133 mg/dL — ABNORMAL HIGH (ref 70–99)
Glucose-Capillary: 105 mg/dL — ABNORMAL HIGH (ref 70–99)
Glucose-Capillary: 96 mg/dL (ref 70–99)
Glucose-Capillary: 165 mg/dL — ABNORMAL HIGH (ref 70–99)

## 2020-04-22 NOTE — Progress Notes (Signed)
[facility] PROGRESS NOTE  Roy Tokarz BPZ:[id number] DOB: 04/04/1965 DOA: 03/31/2020 PCP: [facility], [name]  Status is: Inpatient  Remains inpatient appropriate because:Unsafe d/c plan   Dispo:  Patient From: Home  Planned Disposition: [place] vs. CIR   Expected discharge date: 04/24/2020  Medically stable for discharge: Yes  Difficult to place patient: Yes  Code Status: Full code-patient discussing 1/22 with son and will communicate to team  Family Communication: Patient DVT prophylaxis: SQ heparin Vaccination status: Did receive 1 dose of the Moderna COVID-vaccine.  Subsequently refusing any future doses or even a booster shot.  States he had COVID 3 months ago and has reduced immune system and does not feel these immunizations are effective for him.  Foley catheter: Presented with chronic Foley catheter, also has bilateral percutaneous nephrostomy tubes  HPI: HPI on 03/31/2020 by Dr. 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 Patient was reportedly seen by neighbors yesterday in a recliner and was then found on the floor today obtunded. He was sent to [facility] emergency department and is unable to provide history due to his clinical condition.  Interim history Admitted with acute renal failure on chronic kidney disease, stage V secondary to urinary obstruction and dehydration with electrolyte  abnormalities.  Nephrology consulted.  Patient did undergo hemodialysis.  IR placed bilateral percutaneous nephrostomy tubes.  Creatinine did improve with a couple cycles of hemodialysis however plateaued.  Hemodialysis catheter was then removed.  No further plans for hemodialysis at this time.  Nephrology feels that patient can be discharged home with outpatient follow-up.    During this hospital stay the patient has also had a diverting loop colostomy for a known colovesical fistula in the setting of a pelvic abscess.  He had the ostomy placed on 18 January with general surgery and urology replaced a migrated stent at this time.He is now stable and awaiting CIR.   Subjective: Talkative this morning reports he is feeling overall well denies back pain. Objective: Vitals:   04/21/20 1957 04/22/20 0545  BP: 128/78 113/78  Pulse: 79 77  Resp:  18  Temp: 99.1 F (37.3 C) 98.1 F (36.7 C)  SpO2: 100% 98%    Intake/Output Summary (Last 24 hours) at 04/22/2020 1226 Last data filed at 04/22/2020 0730 Gross per 24 hour  Intake 120 ml  Output 725 ml  Net -605 ml   Filed Weights   04/17/20 0511 04/18/20 0610 04/20/20 0500  Weight: 50.4 kg 52.4 kg 55.6 kg    Exam: Constitutional: Cachectic in appearance, calm no acute distress Respiratory: Bilateral lung sounds clear to auscultation, stable on room air, Cardiovascular: BP stable, pulses regular and nontachycardic, normal heart sounds, extremities warm to touch with adequate capillary refill Abdomen: Colostomy stoma pink, abdomen soft nontender with normoactive bowel sounds Genitourinary: Foley draining with some pink tinge, nephrostomy tubes capped no pain in back overlying sites clean dry and intact Neurologic: Alert and appropriate oriented to person place and   Assessment/Plan: Acute problems:  History of pelvic abscess/known colovesical fistula s/p lap  sigmoid colectomy w/epigastric hernia repair and ostomy creation (1/18) -Noted on  11/15/2019, status post left transgluteal drain by IR/colovesicular fistula -1/18 underwent diverting loop colostomy by [name] in conjunction with urological procedure by Dr. Alinda Money as described below - Repeat CT cystogram did show contrast in retum,  - Pain and nausea control will need transition IV dilaudid to oral therapy   Acute renal failure on chronic kidney disease, stage V likely due to obstruction as well as underlying dehydration on admission. -Nephrology consulted, last seen 1/7.  During this hospital stay the patient has had hemodialysis last session was January 57.  Creatinine has been stabilized.  Monitor daily strict I's and O's. -Bicarbonate supplementation per nephrology -Management of bladder outlet obstruction and ureteral obstruction per urology.   Bilateral ureteral obstruction -Urology following, appreciate care and consultation -Nephrostomy tubes have been capped, monitor creatinine. - If creatinine remains stable, may remove nephrostomy tubes   Bladder outlet obstruction -Flomax discontinued given orthostasis, he will need to restart this for voiding trial.  Foley catheter in place. -Foley catheter was present on admission as detailed above.  No signs of infection at this point.  Recurrent orthostasis -Likely from Flomax-has chronic Foley catheter therefore do not see any harm at this juncture w/ discontinuing Flomax - AM cortisol normal  -Recommend checking orthostatic vital signs every shift  Goals of care -Palliative care consulted and appreciated -Patient full code and refused to discuss any decisions regarding plan of care -Patient should follow up with palliative care as an outpatient -1/19 D/w with patient's son [name].  Recommendation is to have eventual discussion with patient and if necessary family regarding instituting DNR status.  It is felt by the medical team that patient is so poorly conditioned that he would not tolerate a resuscitative event and  likely would not survive.  Emphasis would be on DNR clarification and not comfort care since at this juncture patient appears to be improving slightly although long-term prognosis is not good.  1/20 also discussed with patient and he agrees DNR at this juncture would be warranted given his poor status noting he would not likely survive a resuscitation event.  He wishes to discuss with family first. 1/22 discussed with patient he has had ongoing discussion with his son today.  Severe physical deconditioning, history of prior CVA -July 2021, history of atrial fibrillation, patient is not a candidate for anticoagulation due to GI bleed and thrombocytopenia -Currently no focal deficits  Anemia of Chronic Disease - Last Hgb was 7.5, repeat 1/23  given recent surgery   Severe protein calorie malnutrition Nutrition Problem: Severe Malnutrition Etiology: chronic illness (colovesical fistula) Signs/Symptoms: moderate fat depletion,severe fat depletion,moderate muscle depletion,severe muscle depletion Interventions: MVI,Refer to RD note for recommendations Estimated body mass index is 19.78 kg/m as calculated from the following:   Height as of this encounter: 5\' 6"  (1.676 m).   Weight as of this encounter: 55.6 kg. -Postoperatively patient is reporting increased appetite.  Continue to follow intake.  May require calorie count, liberalize diet to some degree over past few days.  Chronic pain -Pt was taking a massive dose of Neurontin PTA (800 mg TID)-with his low GFR rec is for 200 mg/day- d/w pharmacist and will proceed w/ 100 mg TID  Other problems: Severe dehydration with multiple metabolic derangements as follows: Severe metabolic acidosis, hyperkalemia, hyperphosphatemia -Resolved after rehydration, dialysis and Kayexalate  Severe sepsis secondary to urinary tract infection -Present on admission-sepsis physiology has now resolved -Patient presented with encephalopathy  as well as hypothermia  and leukocytosis -Blood cultures were negative x2 -Urine culture from 12/31 and 1/1 showed multiple species -CT renal stone study 12/31: Bladder wall appeared slightly thickened.  No significant fluid collection in pelvis -Patient completed 7 days of IV cefepime  PTSD -Sedatives held  History of gastric bypass  -Avoid NSAIDs (unable to use currently in context of chronic kidney disease)  Data Reviewed:  CBC    Component Value Date/Time   WBC 6.6 04/21/2020 0628   RBC 2.52 (L) 04/21/2020 0628   HGB 7.5 (L) 04/21/2020 0628   HCT 24.9 (L) 04/21/2020 0628   PLT 287 04/21/2020 0628   MCV 98.8 04/21/2020 0628   MCH 29.8 04/21/2020 0628   MCHC 30.1 04/21/2020 0628   RDW 17.0 (H) 04/21/2020 0628   LYMPHSABS 1.1 04/04/2020 2054   MONOABS 0.3 04/04/2020 2054   EOSABS 0.5 04/04/2020 2054   BASOSABS 0.0 04/04/2020 2054   BMET    Component Value Date/Time   NA 138 04/22/2020 0033   K 3.8 04/22/2020 0033   CL 106 04/22/2020 0033   CO2 21 (L) 04/22/2020 0033   GLUCOSE 91 04/22/2020 0033   BUN 26 (H) 04/22/2020 0033   CREATININE 4.46 (H) 04/22/2020 0033   CALCIUM 7.6 (L) 04/22/2020 0033   GFRNONAA 15 (L) 04/22/2020 0033   GFRAA 12 (L) 01/04/2020 0453       Studies: CT CYSTOGRAM PELVIS  Result Date: 04/20/2020 CLINICAL DATA:  Vesicointestinal fistula. EXAM: CT CYSTOGRAM (CT PELVIS WITH CONTRAST) TECHNIQUE: Multidetector CT imaging through the pelvis was performed after dilute contrast had been introduced into the bladder for the purposes of performing CT cystography. CONTRAST:  50 mL OMNIPAQUE IOHEXOL 300 MG/ML  SOLN COMPARISON:  None. FINDINGS: Urinary Tract: Hand injection water-soluble contrast into the bladder via the Foley catheter. The bladder is distended. Contrast reflux into the ureters. Bilateral ureteral stents are present. Within the excluded rectal pouch (Hartmann's pouch) there is small amount of dependent high density contrast (image 29/3). No precontrast imaging was  performed therefore the timing of the contrast entering the Hartmann's pouch is unclear. There is no direct communication identified however concern for colon leak. Within the RIGHT lower quadrant there is high-density material within a loop of colon (image 1/3). Bowel:  LEFT lower quadrant colostomy. Vascular/Lymphatic: Lymphadenopathy Reproductive:  Prostate unremarkable Other:  Subcutaneous gas in chest wall related to recent surgery. Musculoskeletal: No aggressive osseous lesion IMPRESSION: 1. High-density material within the excluded rectum (Hartmann's pouch) may represent communication with the bladder currently or in the recent past. Percutaneous nephrograms performed 04/18/2020. No direct communication identified currently. 2. Second focus of high-density material within the bowel of the RIGHT lower quadrant. Unclear timing of contrast accumulation but also concerning for communication with the bladder. 3. Difficult to ascertain timing of high-density material accumulation in bowel as there was precontrast imaging prior to the administration of the bladder contrast. 4. The bladder is well distended with reflux into the ureters. NO active leaking identified however concern for occult leak as described above. Electronically Signed   By: Suzy Bouchard M.D.   On: 04/20/2020 20:03    Scheduled Meds: . sodium chloride   Intravenous Once  . calcium carbonate  1 tablet Oral TID  . Chlorhexidine Gluconate Cloth  6 each Topical Daily  . cholecalciferol  1,000 Units Oral Daily  . DULoxetine  60 mg Oral Daily   And  . DULoxetine  30 mg Oral QHS  . feeding supplement  237 mL Oral BID BM  . gabapentin  300 mg Oral QHS  . heparin  5,000 Units Subcutaneous Q8H  . lip balm  1 application Topical BID  . multivitamin with minerals  1 tablet Oral BID  . ondansetron  4 mg Oral Q6H   Or  . ondansetron (ZOFRAN) IV  4 mg Intravenous Q6H  . pantoprazole  40 mg Oral QHS  . polycarbophil  625 mg Oral BID  .  sodium bicarbonate  1,300 mg Oral TID  . sodium chloride flush  3 mL Intravenous Q12H  . sodium chloride flush  5 mL Intracatheter Q8H   Continuous Infusions: . sodium chloride 250 mL (04/22/20 1039)    Active Problems:   CKD (chronic kidney disease) stage 5, GFR less than 15 ml/min (HCC)   Chronic pain   Protein-calorie malnutrition, severe   Pelvic abscess with colovescial fistula   Acute kidney failure (HCC)   History of COVID-19   History of gastric bypass   Hodgkin lymphoma, unspecified, unspecified site ([place])   Metabolic acidosis   Pernicious anemia   Toxic metabolic encephalopathy   PTSD (post-traumatic stress disorder)   Bilateral ureteral obstruction s/p perc nephrosotmy & stenting   Anxiety and depression   History of CVA (cerebrovascular accident)   BMI less than 19,adult   Chronic narcotic use   Anemia in chronic kidney disease   Incisional hernia - epigastric   Sepsis with acute renal failure without septic shock ([place])   DNR (do not resuscitate) discussion   Palliative care by specialist   Adult failure to thrive   Acute respiratory failure with hypoxia ([place])   Diverticulitis of sigmoid colon with abscess s/p colectomy/colostomy Jeanette Caprice) 04/18/2020   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[facility])   Personality disorder    Cachexia ([place])   Consultants:  Nephrology-now signed off  General surgery, seen as needed for ostomy care which is working well  Urology  Palliative medicine  Wound ostomy  Procedures:  1/18-bilateral stents were replaced and repositioned, diverting ostomy ([name]- [facility])   Antibiotics: Anti-infectives (From admission, onward)   Start     Dose/Rate Route Frequency Ordered Stop   04/19/20 0200  cefoTEtan (CEFOTAN) 2 g in sodium chloride 0.9 % 100 mL IVPB        2 g 200 mL/hr over 30 Minutes Intravenous Every 12 hours 04/18/20 1824 04/19/20 0238   04/18/20 1200  cefoTEtan (CEFOTAN) 2 g in sodium chloride 0.9 % 100 mL IVPB        2  g 200 mL/hr over 30 Minutes Intravenous To [facility] 04/17/20 2206 04/18/20 1410   04/17/20 2215  neomycin (MYCIFRADIN) tablet 1,000 mg       "And" Linked Group Details   1,000 mg Oral  Once 04/17/20 2206 04/17/20 2348   04/17/20 2215  metroNIDAZOLE (FLAGYL) tablet 1,000 mg       "And" Linked Group Details   1,000 mg Oral  Once 04/17/20 2206 04/17/20 2336   04/13/20 1145  cefoTEtan (CEFOTAN) 2 g in sodium chloride 0.9 % 100 mL IVPB        2 g 200 mL/hr over 30 Minutes Intravenous On call to O.R. 04/12/20 2006 04/14/20 0559   04/12/20 2100  neomycin (MYCIFRADIN) tablet 1,000 mg       "And" Linked Group Details   1,000 mg Oral 3 times per day 04/12/20 2006 04/13/20 0527   04/12/20 2100  metroNIDAZOLE (FLAGYL) tablet 1,000 mg       "  And" Linked Group Details   1,000 mg Oral 3 times per day 04/12/20 2006 04/13/20 0527   04/03/20 1600  ceFEPIme (MAXIPIME) 1 g in sodium chloride 0.9 % 100 mL IVPB        1 g 200 mL/hr over 30 Minutes Intravenous Every 24 hours 04/03/20 0918 04/07/20 1108   04/01/20 1600  ceFEPIme (MAXIPIME) 1 g in sodium chloride 0.9 % 100 mL IVPB  Status:  Discontinued        1 g 200 mL/hr over 30 Minutes Intravenous Every 24 hours 03/31/20 1550 04/03/20 0918   04/01/20 1145  cefTRIAXone (ROCEPHIN) 1 g in sodium chloride 0.9 % 100 mL IVPB        1 g 200 mL/hr over 30 Minutes Intravenous  Once 04/01/20 1057 04/01/20 1308   03/31/20 1415  ceFEPIme (MAXIPIME) 2 g in sodium chloride 0.9 % 100 mL IVPB        2 g 200 mL/hr over 30 Minutes Intravenous  Once 03/31/20 1407 03/31/20 1516       Time spent:  20 minutes  Dorris Singh, MD  Family Medicine Teaching Service

## 2020-04-23 DIAGNOSIS — K651 Peritoneal abscess: Secondary | ICD-10-CM

## 2020-04-23 LAB — CBC
HCT: 29.5 % — ABNORMAL LOW (ref 39.0–52.0)
Hemoglobin: 8.9 g/dL — ABNORMAL LOW (ref 13.0–17.0)
MCH: 29.8 pg (ref 26.0–34.0)
MCHC: 30.2 g/dL (ref 30.0–36.0)
MCV: 98.7 fL (ref 80.0–100.0)
Platelets: 312 10*3/uL (ref 150–400)
RBC: 2.99 MIL/uL — ABNORMAL LOW (ref 4.22–5.81)
RDW: 17.4 % — ABNORMAL HIGH (ref 11.5–15.5)
WBC: 6.6 10*3/uL (ref 4.0–10.5)
nRBC: 0 % (ref 0.0–0.2)

## 2020-04-23 LAB — GLUCOSE, CAPILLARY
Glucose-Capillary: 103 mg/dL — ABNORMAL HIGH (ref 70–99)
Glucose-Capillary: 141 mg/dL — ABNORMAL HIGH (ref 70–99)
Glucose-Capillary: 241 mg/dL — ABNORMAL HIGH (ref 70–99)
Glucose-Capillary: 89 mg/dL (ref 70–99)
Glucose-Capillary: 92 mg/dL (ref 70–99)
Glucose-Capillary: 96 mg/dL (ref 70–99)

## 2020-04-23 LAB — BASIC METABOLIC PANEL
Anion gap: 9 (ref 5–15)
BUN: 29 mg/dL — ABNORMAL HIGH (ref 6–20)
CO2: 24 mmol/L (ref 22–32)
Calcium: 8 mg/dL — ABNORMAL LOW (ref 8.9–10.3)
Chloride: 106 mmol/L (ref 98–111)
Creatinine, Ser: 4.57 mg/dL — ABNORMAL HIGH (ref 0.61–1.24)
GFR, Estimated: 14 mL/min — ABNORMAL LOW (ref >60)
Glucose, Bld: 98 mg/dL (ref 70–99)
Potassium: 4.4 mmol/L (ref 3.5–5.1)
Sodium: 139 mmol/L (ref 135–145)

## 2020-04-23 MED ORDER — PROSOURCE PLUS PO LIQD
30.0000 mL | Freq: Two times a day (BID) | ORAL | Status: DC
  Administered 2020-04-24 – 2020-04-25 (×2): 30 mL via ORAL
  Filled 2020-04-23 (×2): qty 30

## 2020-04-23 MED ORDER — BOOST / RESOURCE BREEZE PO LIQD CUSTOM
1.0000 | Freq: Three times a day (TID) | ORAL | Status: DC
  Administered 2020-04-24 – 2020-04-25 (×2): 1 via ORAL

## 2020-04-23 MED ORDER — HYDROMORPHONE HCL 1 MG/ML IJ SOLN
0.5000 mg | INTRAMUSCULAR | Status: DC | PRN
  Administered 2020-04-23 – 2020-05-03 (×38): 1 mg via INTRAVENOUS
  Filled 2020-04-23 (×40): qty 1

## 2020-04-23 MED ORDER — SIMETHICONE 80 MG PO CHEW
80.0000 mg | CHEWABLE_TABLET | Freq: Four times a day (QID) | ORAL | Status: DC | PRN
  Administered 2020-04-24 – 2020-05-02 (×18): 80 mg via ORAL
  Filled 2020-04-23 (×18): qty 1

## 2020-04-23 NOTE — Progress Notes (Signed)
Patient ID: Elmer Picker., male   DOB: 07-08-1965, 55 y.o.   MRN: [id number] [facility] Surgery Progress Note:   5 Days Post-Op  Subjective: Mental status is alert and thoughtful.  Complaints hungry for regular foods. Objective: Vital signs in last 24 hours: Temp:  [98.5 F (36.9 C)-99 F (37.2 C)] 99 F (37.2 C) (01/23 0428) Pulse Rate:  [80-97] 80 (01/23 0428) Resp:  [18] 18 (01/23 0428) BP: (105-134)/(68-77) 118/77 (01/23 0428) SpO2:  [98 %-100 %] 100 % (01/23 0428)  Intake/Output from previous day: 01/22 0701 - 01/23 0700 In: 1048.9 [P.O.:820; I.V.:228.9] Out: 2325 [Urine:1925; Stool:400] Intake/Output this shift: No intake/output data recorded.  Physical Exam: Work of breathing is not labored;  Ostomy functioning fine  Lab Results:  Results for orders placed or performed during the hospital encounter of 03/31/20 (from the past 48 hour(s))  Glucose, capillary     Status: Abnormal   Collection Time: 04/21/20 12:09 PM  Result Value Ref Range   Glucose-Capillary 158 (H) 70 - 99 mg/dL    Comment: Glucose reference range applies only to samples taken after fasting for at least 8 hours.  Glucose, capillary     Status: Abnormal   Collection Time: 04/21/20  4:42 PM  Result Value Ref Range   Glucose-Capillary 127 (H) 70 - 99 mg/dL    Comment: Glucose reference range applies only to samples taken after fasting for at least 8 hours.  Glucose, capillary     Status: Abnormal   Collection Time: 04/21/20  7:58 PM  Result Value Ref Range   Glucose-Capillary 139 (H) 70 - 99 mg/dL    Comment: Glucose reference range applies only to samples taken after fasting for at least 8 hours.  Glucose, capillary     Status: None   Collection Time: 04/21/20 11:02 PM  Result Value Ref Range   Glucose-Capillary 88 70 - 99 mg/dL    Comment: Glucose reference range applies only to samples taken after fasting for at least 8 hours.  Basic metabolic panel     Status: Abnormal   Collection  Time: 04/22/20 12:33 AM  Result Value Ref Range   Sodium 138 135 - 145 mmol/L   Potassium 3.8 3.5 - 5.1 mmol/L   Chloride 106 98 - 111 mmol/L   CO2 21 (L) 22 - 32 mmol/L   Glucose, Bld 91 70 - 99 mg/dL    Comment: Glucose reference range applies only to samples taken after fasting for at least 8 hours.   BUN 26 (H) 6 - 20 mg/dL   Creatinine, Ser 4.46 (H) 0.61 - 1.24 mg/dL   Calcium 7.6 (L) 8.9 - 10.3 mg/dL   GFR, Estimated 15 (L) >60 mL/min    Comment: (NOTE) Calculated using the CKD-EPI Creatinine Equation (2021)    Anion gap 11 5 - 15    Comment: Performed at [facility] [address], [place], [place] [address]  Glucose, capillary     Status: None   Collection Time: 04/22/20  3:09 AM  Result Value Ref Range   Glucose-Capillary 97 70 - 99 mg/dL    Comment: Glucose reference range applies only to samples taken after fasting for at least 8 hours.  Glucose, capillary     Status: Abnormal   Collection Time: 04/22/20  7:31 AM  Result Value Ref Range   Glucose-Capillary 133 (H) 70 - 99 mg/dL    Comment: Glucose reference range applies only to samples taken after fasting for at  least 8 hours.  Glucose, capillary     Status: Abnormal   Collection Time: 04/22/20 11:23 AM  Result Value Ref Range   Glucose-Capillary 105 (H) 70 - 99 mg/dL    Comment: Glucose reference range applies only to samples taken after fasting for at least 8 hours.  Glucose, capillary     Status: None   Collection Time: 04/22/20  4:14 PM  Result Value Ref Range   Glucose-Capillary 96 70 - 99 mg/dL    Comment: Glucose reference range applies only to samples taken after fasting for at least 8 hours.  Glucose, capillary     Status: Abnormal   Collection Time: 04/22/20  8:10 PM  Result Value Ref Range   Glucose-Capillary 165 (H) 70 - 99 mg/dL    Comment: Glucose reference range applies only to samples taken after fasting for at least 8 hours.  Glucose, capillary     Status: None   Collection Time:  04/23/20 12:00 AM  Result Value Ref Range   Glucose-Capillary 92 70 - 99 mg/dL    Comment: Glucose reference range applies only to samples taken after fasting for at least 8 hours.  Basic metabolic panel     Status: Abnormal   Collection Time: 04/23/20  1:44 AM  Result Value Ref Range   Sodium 139 135 - 145 mmol/L   Potassium 4.4 3.5 - 5.1 mmol/L   Chloride 106 98 - 111 mmol/L   CO2 24 22 - 32 mmol/L   Glucose, Bld 98 70 - 99 mg/dL    Comment: Glucose reference range applies only to samples taken after fasting for at least 8 hours.   BUN 29 (H) 6 - 20 mg/dL   Creatinine, Ser 4.57 (H) 0.61 - 1.24 mg/dL   Calcium 8.0 (L) 8.9 - 10.3 mg/dL   GFR, Estimated 14 (L) >60 mL/min    Comment: (NOTE) Calculated using the CKD-EPI Creatinine Equation (2021)    Anion gap 9 5 - 15    Comment: Performed at [facility] [address] [place], [place] [address]  CBC     Status: Abnormal   Collection Time: 04/23/20  1:44 AM  Result Value Ref Range   WBC 6.6 4.0 - 10.5 K/uL   RBC 2.99 (L) 4.22 - 5.81 MIL/uL   Hemoglobin 8.9 (L) 13.0 - 17.0 g/dL   HCT 29.5 (L) 39.0 - 52.0 %   MCV 98.7 80.0 - 100.0 fL   MCH 29.8 26.0 - 34.0 pg   MCHC 30.2 30.0 - 36.0 g/dL   RDW 17.4 (H) 11.5 - 15.5 %   Platelets 312 150 - 400 K/uL   nRBC 0.0 0.0 - 0.2 %    Comment: Performed at [facility], [place] [address], [place], [place] [address]  Glucose, capillary     Status: None   Collection Time: 04/23/20  4:25 AM  Result Value Ref Range   Glucose-Capillary 96 70 - 99 mg/dL    Comment: Glucose reference range applies only to samples taken after fasting for at least 8 hours.  Glucose, capillary     Status: None   Collection Time: 04/23/20  7:44 AM  Result Value Ref Range   Glucose-Capillary 89 70 - 99 mg/dL    Comment: Glucose reference range applies only to samples taken after fasting for at least 8 hours.    Radiology/Results: No results found.  Anti-infectives: Anti-infectives (From admission,  onward)   Start     Dose/Rate Route  Frequency Ordered Stop   04/19/20 0200  cefoTEtan (CEFOTAN) 2 g in sodium chloride 0.9 % 100 mL IVPB        2 g 200 mL/hr over 30 Minutes Intravenous Every 12 hours 04/18/20 1824 04/19/20 0238   04/18/20 1200  cefoTEtan (CEFOTAN) 2 g in sodium chloride 0.9 % 100 mL IVPB        2 g 200 mL/hr over 30 Minutes Intravenous To [facility]Stay [facility] 04/17/20 2206 04/18/20 1410   04/17/20 2215  neomycin (MYCIFRADIN) tablet 1,000 mg       "And" Linked Group Details   1,000 mg Oral  Once 04/17/20 2206 04/17/20 2348   04/17/20 2215  metroNIDAZOLE (FLAGYL) tablet 1,000 mg       "And" Linked Group Details   1,000 mg Oral  Once 04/17/20 2206 04/17/20 2336   04/13/20 1145  cefoTEtan (CEFOTAN) 2 g in sodium chloride 0.9 % 100 mL IVPB        2 g 200 mL/hr over 30 Minutes Intravenous On call to O.R. 04/12/20 2006 04/14/20 0559   04/12/20 2100  neomycin (MYCIFRADIN) tablet 1,000 mg       "And" Linked Group Details   1,000 mg Oral 3 times per day 04/12/20 2006 04/13/20 0527   04/12/20 2100  metroNIDAZOLE (FLAGYL) tablet 1,000 mg       "And" Linked Group Details   1,000 mg Oral 3 times per day 04/12/20 2006 04/13/20 0527   04/03/20 1600  ceFEPIme (MAXIPIME) 1 g in sodium chloride 0.9 % 100 mL IVPB        1 g 200 mL/hr over 30 Minutes Intravenous Every 24 hours 04/03/20 0918 04/07/20 1108   04/01/20 1600  ceFEPIme (MAXIPIME) 1 g in sodium chloride 0.9 % 100 mL IVPB  Status:  Discontinued        1 g 200 mL/hr over 30 Minutes Intravenous Every 24 hours 03/31/20 1550 04/03/20 0918   04/01/20 1145  cefTRIAXone (ROCEPHIN) 1 g in sodium chloride 0.9 % 100 mL IVPB        1 g 200 mL/hr over 30 Minutes Intravenous  Once 04/01/20 1057 04/01/20 1308   03/31/20 1415  ceFEPIme (MAXIPIME) 2 g in sodium chloride 0.9 % 100 mL IVPB        2 g 200 mL/hr over 30 Minutes Intravenous  Once 03/31/20 1407 03/31/20 1516      Assessment/Plan: Problem List: Patient Active Problem List    Diagnosis Date Noted  . Cachexia ([place]) 04/20/2020  . Personality disorder  04/19/2020  . Diverticulitis of sigmoid colon with abscess s/p colectomy/colostomy [facility]) 04/18/2020 04/18/2020  . 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[facility]) 04/18/2020  . Acute respiratory failure with hypoxia ([place])   . DNR (do not resuscitate) discussion   . Palliative care by specialist   . Adult failure to thrive   . Sepsis with acute renal failure without septic shock ([place]) 03/31/2020  . Incisional hernia - epigastric 01/04/2020  . Chronic narcotic use 01/02/2020  . Anemia in chronic kidney disease 01/02/2020  . ATN (acute tubular necrosis) ([place]) 12/31/2019  . Candida UTI 12/31/2019  . Electrolyte abnormality 12/31/2019  . Increased anion gap metabolic acidosis 70/04/7492  . Microcytic anemia 12/31/2019  . ABLA (acute blood loss anemia) 12/31/2019  . Anxiety and depression 12/31/2019  . History of CVA (cerebrovascular accident) 12/31/2019  . BMI less than 19,adult 12/31/2019  . Bilateral ureteral obstruction s/p perc nephrosotmy & stenting 12/30/2019  . AKI (acute kidney injury) ([place]) 12/24/2019  .  Pelvic abscess with colovescial fistula 11/15/2019  . E. coli UTI (urinary tract infection) 11/15/2019  . PTSD (post-traumatic stress disorder)   . Chemical exposure   . Cervical radiculopathy   . Septic shock ([place]) 11/08/2019  . Protein-calorie malnutrition, severe 10/23/2019  . Cerebral embolism with cerebral infarction 10/18/2019  . Acute left-sided weakness 10/17/2019  . CKD (chronic kidney disease) stage 5, GFR less than 15 ml/min (HCC) 10/17/2019  . Chronic pain 10/17/2019  . Fall 10/17/2019  . Pseudomonas urinary tract infection 08/24/2019  . BPH (benign prostatic hyperplasia) 08/24/2019  . Acute kidney failure ([place]) 08/24/2019  . Anemia 08/24/2019  . Dyspnea 08/24/2019  . History of COVID-19 08/24/2019  . History of elevated PSA 08/24/2019  . 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 Hyponatremia 08/24/2019   . Metabolic acidosis 30/94/0768  . Skin lesion of neck 08/24/2019  . Toxic metabolic encephalopathy 08/81/1031  . Cervical spondylosis without myelopathy 06/19/2016  . Neck pain 09/04/2015  . History of stomach ulcers 05/19/2013  . Male erectile dysfunction 05/19/2013  . Hodgkin lymphoma, unspecified, unspecified site ([place]) 12/10/2012  . Iron deficiency anemia 12/10/2012  . Pernicious anemia 12/10/2012  . Arthralgia of multiple sites 04/20/2012  . Postlaminectomy syndrome, not elsewhere classified 04/20/2012    He has a healthy appetite and with his temporal wasting he looks like someone who would be better served with a ad lib diet.  No other surgical suggestions 5 Days Post-Op    LOS: 23 days   Matt B. Hassell Done, MD, [facility] [phone] to reach the surgeon on call.    04/23/2020 9:47 AM

## 2020-04-23 NOTE — Progress Notes (Signed)
PROGRESS NOTE    Chase [name]  DVV:[id number]  DOB: 1966/01/30  DOA: 03/31/2020 PCP: [facility] Out[facility] Specialists:   Hospital course:  55 year old man with PMH significant for Hodgkin's disease, gastric bypass, colovesicular fistula, CVA, CKD and bilateral ureteral stenosis with ureteral stents and percutaneous nephrostomy tubes was admitted 03/31/2020 with altered mental status having pulled out his bilateral nephrostomy tube and in acute renal failure.  Patient was treated with hemodialysis and replacement of bilateral percutaneous nephrostomy tubes.  Creatinine improved however plateaued around 4.5.  Hospital course has been complicated by sigmoid diverticulitis/pelvic accessed with known colovesicular fistula.  Patient is status post lap sigmoid colectomy with epigastric hernia repair on 04/18/2020.  He has had a diverting colostomy and Hartman's pouch.  Ureteral stents were also changed at that time.  Subjective:  Patient states that he is doing okay.  States he is pleased that his diet is being advanced by general surgery.  Denies any nausea vomiting or new abdominal pain.  States he is tolerating his diet well.  Notes pain is well controlled.   Objective: Vitals:   04/22/20 0545 04/22/20 1343 04/22/20 2008 04/23/20 0428  BP: 113/78 105/68 134/75 118/77  Pulse: 77 91 97 80  Resp: 18 18 18 18   Temp: 98.1 F (36.7 C) 99 F (37.2 C) 98.5 F (36.9 C) 99 F (37.2 C)  TempSrc:  Oral Oral Oral  SpO2: 98% 98% 98% 100%  Weight:      Height:        Intake/Output Summary (Last 24 hours) at 04/23/2020 1207 Last data filed at 04/23/2020 0600 Gross per 24 hour  Intake 928.87 ml  Output 2325 ml  Net -1396.13 ml   Filed Weights   04/17/20 0511 04/18/20 0610 04/20/20 0500  Weight: 50.4 kg 52.4 kg 55.6 kg     Exam:  General: Cachectic man looking much older than stated age sitting up in bed, chatting and smiling. Eyes: sclera anicteric, conjuctiva  mild injection bilaterally CVS: S1-S2, regular  Respiratory:  decreased air entry bilaterally secondary to decreased inspiratory effort, rales at bases  GI: Patient does have bowel sounds.  Abdomen is soft and nontender.  Colostomy appears pink.  Mild incisional tenderness.  Nephrostomy tubes in place with minimal drainage. LE: No edema.  Neuro: A/O x 3, Moving all extremities equally with normal strength, CN 3-12 intact, grossly nonfocal.  Psych: patient is logical and coherent, judgement and insight appear normal, mood and affect appropriate to situation.   Assessment & Plan:   55 year old man with acute on chronic renal failure now off dialysis with bilateral ureteral obstruction and colovesicular fistula status post diverting colostomy and Hartman's pouch.  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  Urology is considering repeat CT cystogram in 10 to 14 days.  There was hope that the fistula had closed. Patient continues on high doses of Dilaudid 0.5 to 1 mg every 2 hours as well as as needed oxycodone Will decrease Dilaudid to every 4 hours and continue oxycodone, can wean narcotics as tolerated.  Acute on chronic renal failure stage V Patient presently off dialysis since January 3 Creatinine stable around 4.5. PCN tubes have been replaced and patient remains on Foley Urology continues to follow  Recurrent orthostasis Thought to be secondary  to Flomax which has been discontinued Cosyntropin stimulation test  was normal Continue to follow clinically.  Chronic pain Patient previously on Neurontin 800 mg TID which has been decreased to 300 at bedtime given renal failure.     Copied from previous note by Dr. Tana Coast:  Goals of care -Palliative care  consulted and appreciated -Patient full code and refused to discuss any decisions regarding plan of care -Patient should follow up with palliative care as an outpatient -1/19 D/w with patient's son [name].  Recommendation is to have eventual discussion with patient and if necessary family regarding instituting DNR status.  It is felt by the medical team that patient is so poorly conditioned that he would not tolerate a resuscitative event and likely would not survive.  Emphasis would be on DNR clarification and not comfort care since at this juncture patient appears to be improving slightly although long-term prognosis is not good.  1/20 also discussed with patient and he agrees DNR at this juncture would be warranted given his poor status noting he would not likely survive a resuscitation event.  He wishes to discuss with family first  Severe physical deconditioning, history of prior CVA -July 2021, history of atrial fibrillation, patient is not a candidate for anticoagulation due to GI bleed and thrombocytopenia -Currently no focal deficits  Severe protein calorie malnutrition Nutrition Problem: Severe Malnutrition Etiology: chronic illness (colovesical fistula) Signs/Symptoms: moderate fat depletion,severe fat depletion,moderate muscle depletion,severe muscle depletion Interventions: MVI,Refer to RD note for recommendations Estimated body mass index is 19.78 kg/m as calculated from the following:   Height as of this encounter: 5\' 6"  (1.676 m).   Weight as of this encounter: 55.6 kg.  Severe sepsis secondary to urinary tract infection Treated with cefepime x7 days with resolution Urine culture from 12/31 and 1/1 showed multiple species CT renal stone study 12/31: Bladder wall appeared slightly thickened. No significant fluid collection   DVT prophylaxis: Subcu heparin Code Status: Full code Family Communication: None today Disposition Plan:   Patient is from: Home  Anticipated  Discharge Location: SNF  Barriers to Discharge: Recovering from recent abdominal and urology surgery  Is patient medically stable for Discharge: No   Consultants:  Urology  General surgery  Nephrology   Data Reviewed:  Basic Metabolic Panel: Recent Labs  Lab 04/17/20 0307 04/18/20 1205 04/19/20 0057 04/21/20 0628 04/22/20 0033 04/23/20 0144  NA 137 136 135 138 138 139  K 4.6 4.2 4.2 3.8 3.8 4.4  CL 103 105 103 106 106 106  CO2 25  --  20* 23 21* 24  GLUCOSE 95 82 141* 90 91 98  BUN 29* 29* 25* 23* 26* 29*  CREATININE 4.97* 4.80* 4.64* 4.31* 4.46* 4.57*  CALCIUM 8.3*  --  8.2* 7.5* 7.6* 8.0*  MG  --   --   --  1.8  --   --   PHOS  --   --   --  2.6  --   --    Liver Function Tests: Recent Labs  Lab 04/17/20 0307  AST 33  ALT 26  ALKPHOS 122  BILITOT 0.5  PROT 6.4*  ALBUMIN 2.3*   No results for input(s): LIPASE, AMYLASE in the last 168 hours. No results for input(s): AMMONIA in the last 168 hours. CBC: Recent Labs  Lab 04/17/20 0307 04/18/20 1205 04/19/20 0057 04/21/20 0628 04/23/20 0144  WBC 6.8  --  7.9 6.6 6.6  HGB 9.4* 11.2* 9.1* 7.5* 8.9*  HCT 31.3* 33.0* 28.9*  24.9* 29.5*  MCV 97.5  --  96.3 98.8 98.7  PLT 341  --  349 287 312   Cardiac Enzymes: No results for input(s): CKTOTAL, CKMB, CKMBINDEX, TROPONINI in the last 168 hours. BNP (last 3 results) No results for input(s): PROBNP in the last 8760 hours. CBG: Recent Labs  Lab 04/22/20 1614 04/22/20 2010 04/23/20 0000 04/23/20 0425 04/23/20 0744  GLUCAP 96 165* 92 96 89    No results found for this or any previous visit (from the past 240 hour(s)).    Studies: No results found.   Scheduled Meds: . sodium chloride   Intravenous Once  . calcium carbonate  1 tablet Oral TID  . Chlorhexidine Gluconate Cloth  6 each Topical Daily  . cholecalciferol  1,000 Units Oral Daily  . DULoxetine  60 mg Oral Daily   And  . DULoxetine  30 mg Oral QHS  . gabapentin  300 mg Oral QHS  .  heparin  5,000 Units Subcutaneous Q8H  . lip balm  1 application Topical BID  . multivitamin with minerals  1 tablet Oral BID  . ondansetron  4 mg Oral Q6H   Or  . ondansetron (ZOFRAN) IV  4 mg Intravenous Q6H  . pantoprazole  40 mg Oral QHS  . polycarbophil  625 mg Oral BID  . sodium bicarbonate  1,300 mg Oral TID  . sodium chloride flush  3 mL Intravenous Q12H  . sodium chloride flush  5 mL Intracatheter Q8H   Continuous Infusions: . sodium chloride 250 mL (04/22/20 1039)    Active Problems:   CKD (chronic kidney disease) stage 5, GFR less than 15 ml/min (HCC)   Chronic pain   Protein-calorie malnutrition, severe   Pelvic abscess with colovescial fistula   Acute kidney failure (HCC)   History of COVID-19   History of gastric bypass   Hodgkin lymphoma, unspecified, unspecified site (HCC)   Metabolic acidosis   Pernicious anemia   Toxic metabolic encephalopathy   PTSD (post-traumatic stress disorder)   Bilateral ureteral obstruction s/p perc nephrosotmy & stenting   Anxiety and depression   History of CVA (cerebrovascular accident)   BMI less than 19,adult   Chronic narcotic use   Anemia in chronic kidney disease   Incisional hernia - epigastric   Sepsis with acute renal failure without septic shock ([place])   DNR (do not resuscitate) discussion   Palliative care by specialist   Adult failure to thrive   Acute respiratory failure with hypoxia ([place])   Diverticulitis of sigmoid colon with abscess s/p colectomy/colostomy [facility]) 04/18/2020   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[facility]   Personality disorder    Cachexia ([place])     Damek Ende Derek Jack, [facility]  If 7PM-7AM, please contact night-coverage [email or web address] Password [email or web address] 04/23/2020, 12:07 PM    LOS: 23 days

## 2020-04-24 DIAGNOSIS — Z933 Colostomy status: Secondary | ICD-10-CM | POA: Diagnosis not present

## 2020-04-24 DIAGNOSIS — R5381 Other malaise: Secondary | ICD-10-CM

## 2020-04-24 DIAGNOSIS — N185 Chronic kidney disease, stage 5: Secondary | ICD-10-CM | POA: Diagnosis not present

## 2020-04-24 DIAGNOSIS — N321 Vesicointestinal fistula: Secondary | ICD-10-CM | POA: Diagnosis not present

## 2020-04-24 DIAGNOSIS — F4310 Post-traumatic stress disorder, unspecified: Secondary | ICD-10-CM | POA: Diagnosis not present

## 2020-04-24 LAB — BASIC METABOLIC PANEL
Anion gap: 8 (ref 5–15)
BUN: 32 mg/dL — ABNORMAL HIGH (ref 6–20)
CO2: 21 mmol/L — ABNORMAL LOW (ref 22–32)
Calcium: 7.5 mg/dL — ABNORMAL LOW (ref 8.9–10.3)
Chloride: 106 mmol/L (ref 98–111)
Creatinine, Ser: 5.06 mg/dL — ABNORMAL HIGH (ref 0.61–1.24)
GFR, Estimated: 13 mL/min — ABNORMAL LOW (ref >60)
Glucose, Bld: 96 mg/dL (ref 70–99)
Potassium: 3.9 mmol/L (ref 3.5–5.1)
Sodium: 135 mmol/L (ref 135–145)

## 2020-04-24 LAB — GLUCOSE, CAPILLARY
Glucose-Capillary: 123 mg/dL — ABNORMAL HIGH (ref 70–99)
Glucose-Capillary: 99 mg/dL (ref 70–99)
Glucose-Capillary: 103 mg/dL — ABNORMAL HIGH (ref 70–99)

## 2020-04-24 MED ORDER — HYDROXYZINE HCL 10 MG PO TABS
10.0000 mg | ORAL_TABLET | Freq: Three times a day (TID) | ORAL | Status: DC | PRN
  Administered 2020-04-24 – 2020-05-03 (×18): 10 mg via ORAL
  Filled 2020-04-24 (×22): qty 1

## 2020-04-24 MED ORDER — TIZANIDINE HCL 2 MG PO TABS
2.0000 mg | ORAL_TABLET | Freq: Three times a day (TID) | ORAL | Status: DC | PRN
  Administered 2020-04-24 – 2020-05-03 (×22): 2 mg via ORAL
  Filled 2020-04-24 (×22): qty 1

## 2020-04-24 NOTE — Progress Notes (Signed)
Patient ID: Chase [name]., male   DOB: [birth year], 55 y.o.   MRN: [id number]  6 Days Post-Op Subjective: Bilateral nephrostomy tubes clamped over the weekend.    Objective: Vital signs in last 24 hours: Temp:  [98 F (36.7 C)-99.9 F (37.7 C)] 98 F (36.7 C) (01/24 0353) Pulse Rate:  [88-90] 89 (01/24 0353) Resp:  [17-19] 19 (01/24 0353) BP: (117-134)/(54-81) 134/54 (01/24 0353) SpO2:  [97 %-100 %] 100 % (01/24 0353) Weight:  [59.2 kg] 59.2 kg (01/24 0500)  Intake/Output from previous day: 01/23 0701 - 01/24 0700 In: 1110.6 [P.O.:870; I.V.:240.6] Out: 2100 [Urine:1950; Stool:150] Intake/Output this shift: No intake/output data recorded.  Physical Exam:  General: Alert and oriented GU: Urine clear.  Lab Results: Recent Labs    04/23/20 0144  HGB 8.9*  HCT 29.5*   CBC Latest Ref Rng & Units 04/23/2020 04/21/2020 04/19/2020  WBC 4.0 - 10.5 K/uL 6.6 6.6 7.9  Hemoglobin 13.0 - 17.0 g/dL 8.9(L) 7.5(L) 9.1(L)  Hematocrit 39.0 - 52.0 % 29.5(L) 24.9(L) 28.9(L)  Platelets 150 - 400 K/uL 312 287 349     BMET Recent Labs    04/23/20 0144 04/24/20 0228  NA 139 135  K 4.4 3.9  CL 106 106  CO2 24 21*  GLUCOSE 98 96  BUN 29* 32*  CREATININE 4.57* 5.06*  CALCIUM 8.0* 7.5*     Studies/Results: No results found.  Assessment/Plan: 1) Colovesical fistula: CT cystogram reviewed last week.  No clear fistulous tract seen but contrast noted in rectal stump without clear explanation.  Will consider repeat study before and after bladder contrast in about 10-14 days for further evaluation. 2) Bilateral ureteral obstruction: Bilateral nephrostomy tubes capped over the weekend.  Renal function stable but then slightly worse over last 24 hrs.  Will delay nephrostomy removal today in IR to monitor renal function over next 24 hrs to ensure no concerning trend with tentative plans to remove under fluoroscopy on Tuesday if Cr not trending up.  3) Bladder outlet obstruction: Will  address this last after addressing above issues. Tamsulosin on hold due to syncopal episode.  Can try to rechallenge him with alpha blocker when appropriate. Will eventually proceed with a voiding trial (possibly as outpatient).  Likely will delay this until issue of fistula is definitively determined.   LOS: 24 days   [name] 04/24/2020, 7:55 AM

## 2020-04-24 NOTE — Plan of Care (Signed)
  Problem: Education: Goal: Knowledge of General Education information will improve Description Including pain rating scale, medication(s)/side effects and non-pharmacologic comfort measures Outcome: Progressing   

## 2020-04-24 NOTE — Progress Notes (Signed)
Noted increased soreness in sacral area which is reddened. New sacral foam placed. Skin orders initiated and air loss bed ordered (none available at this time).  Also ordered a pressure redistribution pad for pt once he sits up.

## 2020-04-24 NOTE — Progress Notes (Addendum)
Chase [name] [id number] [birth year]  CARE TEAM:  PCP: [facility] [place] Team: Patient Care Team: [name] as PCP - General Raynelle Bring, MD as Consulting Physician (Urology) Michael Boston, MD as Consulting Physician (General Surgery) Kandace Blitz, MD as Referring Physician (Pain Medicine) Mignon Pine, DO as Consulting Physician (Infectious Diseases) Garvin Fila, MD as Consulting Physician (Neurology) Gatha Mayer, MD as Consulting Physician (Gastroenterology) Joylene Grapes Shaune Pollack, MD as Consulting Physician (Nephrology)  Inpatient Treatment Team: Treatment Team: Attending Provider: Mckinley Jewel, MD; Consulting Physician: Reesa Chew, MD; Technician: Rica Koyanagi, NT; Consulting Physician: Michael Boston, MD; Consulting Physician: Raynelle Bring, MD; [place] Nurse: Meryle Ready, RN; [place] Nurse: Nadara Mode, RN; Rounding Team: Jacquelyne Balint Team, MD; Nurse Practitioner: Samella Parr, NP; Case Manager: Arna Snipe, RN; [place] Nurse: Jeannie Fend, FNP; Technician: Gasper Sells, NT; Registered Nurse: Donzetta Matters, RN; Registered Nurse: Kennith Center, RN; Technician: Antoine Primas Rounding Team: Lind Guest, MD; Physical Therapist: Alda Lea, PT; Registered Nurse: Clydia Llano, RN   Problem List:   Active Problems:   CKD (chronic kidney disease) stage 5, GFR less than 15 ml/min (HCC)   Acute kidney failure ([place])   PTSD (post-traumatic stress disorder)   Cachexia ([place])   Protein-calorie malnutrition, severe   Pelvic abscess with colovescial fistula   History of gastric bypass   Bilateral ureteral obstruction s/p perc nephrosotmy & stenting   History of CVA (cerebrovascular accident)   Chronic pain   History of COVID-19   Hodgkin lymphoma, unspecified, unspecified site ([place])   Metabolic acidosis   Pernicious anemia   Toxic metabolic encephalopathy    Anxiety and depression   BMI less than 19,adult   Chronic narcotic use   Anemia in chronic kidney disease   Incisional hernia - epigastric   Sepsis with acute renal failure without septic shock ([place])   DNR (do not resuscitate) discussion   Palliative care by specialist   Adult failure to thrive   Acute respiratory failure with hypoxia ([place])   Diverticulitis of sigmoid colon with abscess s/p colectomy/colostomy Jeanette Caprice) 04/18/2020   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[facility])   Personality disorder    6 Days Post-Op  04/18/2020  COLORECTAL POST-OPERATIVE DIAGNOSIS:   SIGMOID DIVERTICULITIS EPIGASTRIC INCISIONAL HERNIA  PROCEDURE:   LAPAROSCOPIC SIGMOID COLOTOMY WITH COLOSTOMY ([place]) INCISIONAL EPIGASTRIC HERNIA REPAIR - PRIMARY SUTURE  SURGEON:  Adin Hector, MD  UROLOGY Postoperative diagnosis:  1. Bilateral ureteral obstruction   Procedure:  1. Cystoscopy 2. Bilateral ureteral stent replacement (RIGHT 6Fr x 26cm, LEFT 6Fr x 28cm) 3. Bilateral retrograde pyelogram  Surgeon: Louis Meckel, MD  Assessment  Recovering  [facility] Stay = 24 days)  Plan:  Stable for discharge from colorectal surgery standpoint.  Will follow peripherally - call w ?s.  F/u with me in ~3 weeks  Solid renal diet - he refuses & wants regular diet  -mobilize.  Patient is trying to work with physical therapy more.  Nursing and nurse tech in room.  He noted he we will try and keep work with him.  -Concern for orthostasis.  IV fluid bolus.  Check orthostatic vital signs regularly.  Holding most of his blood pressure medicines.  Can hold even his Flomax as that may encourage orthostasis as well.  Ultimately defer to medicine.    -r/o glucocorticoid and mineralocorticoid insufficiency contributing to this?  Cortisol OK. PRA pending,  I believe    Defer to medicine/nephrology/Urology for other workup  -Chronic anemia without any postoperative major fall.  He does not seem to be iron deficient.  We  will hold off on any more IV iron.  He received that last hospitalization.  -IVF per Urology/primary service (typically medlock POD#1 from colon standpoint).  -f/u pathology -colon shows fibrosis but no active perforation nor malignancy.  Benign.  -colostomy care/teaching.  Patient resistant to learning and wishes it to be managed by his mother and [place] system.  Wound ostomy team skeptical that this will succeed.  I am skeptical as well.  See if we can convince him to do what we recommend and not fight on this  -perc nephrostomy/ureteral stents/foley per Urology = Dr Alinda Money.  Hopefully can get nephrostomy tubes out if stents working better.  Grossly there was no major CV fistula.  Concern for cecal & rectal stump contrast - I think it was old stuff & no obvious leak, but agree w re-eval in 10-14 d to recheck. .  D/w Dr Alinda Money - he is trying capping nephrostomy tubes  -personality disorder with intermittent medical AMA/noncompliance.  Probable PTSD and chronic depression given estranged sons and loss of life and daughter - per primary service.  ?SSRI?  -VTE prophylaxis- SCDs, etc  -mobilize as tolerated to help recovery  Disposition:  Disposition:  The patient is from: [place] discharge to:  [place]  Anticipated Date of Discharge is:  October 23, 2020  Barriers to discharge:  Pending Clinical improvement (more likely than not  Patient currently is NOT MEDICALLY STABLE for discharge from the hospital from a surgery standpoint.   30 minutes spent in review, evaluation, examination, counseling, and coordination of care.  More than 50% of that time was spent in counseling.  04/24/2020    Subjective: (Chief complaint)  No major events x orthostatic  Apologetic for being terse/disagreeing with recs. - Appreciative of MD & nursing care.  Sore but pain controlled -back to his usual chronic abdominal & spine pain   Objective:  Vital  signs:  Vitals:   04/23/20 1350 04/23/20 2136 04/24/20 0353 04/24/20 0500  BP: 127/74 117/81 (!) 134/54   Pulse: 88 90 89   Resp: 17 18 19    Temp: 99.9 F (37.7 C) 98 F (36.7 C) 98 F (36.7 C)   TempSrc: Oral Oral Oral   SpO2: 99% 97% 100%   Weight:    59.2 kg  Height:       Body mass index is 21.07 kg/m.   Last BM Date: 04/24/20  Intake/Output   Yesterday:  01/23 0701 - 01/24 0700 In: 1110.6 [P.O.:870; I.V.:240.6] Out: 2100 [Urine:1950; Stool:150] This shift:  No intake/output data recorded.  Bowel function:  Flatus: YES  BM:  YES     Physical Exam:  General: Pt awake/alert in no acute distress.  More relaxed & smiling.  Shook my hand.  Remains very cachectic Eyes: PERRL, normal EOM.  Sclera clear.  No icterus Neuro: CN II-XII intact w/o focal sensory/motor deficits. Lymph: No head/neck/groin lymphadenopathy Psych:  No delerium/psychosis/paranoia.  Oriented x 4 HENT: Normocephalic, Mucus membranes moist.  No thrush Neck: Supple, No tracheal deviation.  No obvious thyromegaly Chest: No pain to chest wall compression.  Good respiratory excursion.  No audible wheezing CV:  Pulses intact.  Regular rhythm.  No major extremity edema MS: Normal AROM mjr joints.  No obvious deformity  Abdomen: Soft.  Nondistended.  Nontender.  Colostomy pink w mild edema.  No evidence of peritonitis.  No incarcerated hernias.  GU: foley in place.  No hernias Ext:  No deformity.  No mjr edema.  No cyanosis Skin: No petechiae / purpurea.  No major sores.  Warm and dry    Results:   Cultures: Recent Results (from the past 720 hour(s))  Urine culture     Status: Abnormal   Collection Time: 03/31/20  2:07 PM   Specimen: In/Out Cath Urine  Result Value Ref Range Status   Specimen Description   Final    IN/OUT CATH URINE Performed at [facility], [address] [place], [place] [address]    Special Requests   Final    NONE Performed at [facility], [address],  [place], [place] [address]    Culture MULTIPLE SPECIES PRESENT, SUGGEST RECOLLECTION (A)  Final   Report Status 04/02/2020 FINAL  Final  Blood Culture (routine x 2)     Status: None   Collection Time: 03/31/20  2:20 PM   Specimen: BLOOD  Result Value Ref Range Status   Specimen Description BLOOD BLOOD LEFT FOREARM  Final   Special Requests   Final    BOTTLES DRAWN AEROBIC AND ANAEROBIC Blood Culture adequate volume   Culture   Final    NO GROWTH 5 DAYS Performed at [facility], [address] [place], [place] [address]    Report Status 04/05/2020 FINAL  Final  Blood Culture (routine x 2)     Status: None   Collection Time: 03/31/20  2:20 PM   Specimen: BLOOD  Result Value Ref Range Status   Specimen Description BLOOD LEFT ANTECUBITAL  Final   Special Requests   Final    BOTTLES DRAWN AEROBIC AND ANAEROBIC Blood Culture adequate volume   Culture   Final    NO GROWTH 5 DAYS Performed at [facility], [address], [place], [place] [address]    Report Status 04/05/2020 FINAL  Final  Resp Panel by RT-PCR (Flu A&B, Covid) Nasopharyngeal Swab     Status: None   Collection Time: 03/31/20  2:48 PM   Specimen: Nasopharyngeal Swab; Nasopharyngeal(NP) swabs in vial transport medium  Result Value Ref Range Status   SARS Coronavirus 2 by RT PCR NEGATIVE NEGATIVE Final    Comment: (NOTE) SARS-CoV-2 target nucleic acids are NOT DETECTED.  The SARS-CoV-2 RNA is generally detectable in upper respiratory specimens during the acute phase of infection. The lowest concentration of SARS-CoV-2 viral copies this assay can detect is 138 copies/mL. A negative result does not preclude SARS-Cov-2 infection and should not be used as the sole basis for treatment or other patient management decisions. A negative result may occur with  improper specimen collection/handling, submission of specimen other than nasopharyngeal swab, presence of viral mutation(s) within the areas targeted by this assay, and  inadequate number of viral copies(<138 copies/mL). A negative result must be combined with clinical observations, patient history, and epidemiological information. The expected result is Negative.  Fact Sheet for Patients:  EntrepreneurPulse.com.au  Fact Sheet for Healthcare Providers:  IncredibleEmployment.be  This test is no t yet approved or cleared by the [place] FDA and  has been authorized for detection and/or diagnosis of SARS-CoV-2 by FDA under an Emergency Use Authorization (EUA). This EUA will remain  in effect (meaning this test can be used) for the duration of the COVID-19 declaration under Section 564(b)(1) of the Act, 21 U.S.C.section 360bbb-3(b)(1), unless the authorization is terminated  or revoked  sooner.       Influenza A by PCR NEGATIVE NEGATIVE Final   Influenza B by PCR NEGATIVE NEGATIVE Final    Comment: (NOTE) The Xpert Xpress SARS-CoV-2/FLU/RSV plus assay is intended as an aid in the diagnosis of influenza from Nasopharyngeal swab specimens and should not be used as a sole basis for treatment. Nasal washings and aspirates are unacceptable for Xpert Xpress SARS-CoV-2/FLU/RSV testing.  Fact Sheet for Patients: EntrepreneurPulse.com.au  Fact Sheet for Healthcare Providers: IncredibleEmployment.be  This test is not yet approved or cleared by the [place] FDA and has been authorized for detection and/or diagnosis of SARS-CoV-2 by FDA under an Emergency Use Authorization (EUA). This EUA will remain in effect (meaning this test can be used) for the duration of the COVID-19 declaration under Section 564(b)(1) of the Act, 21 U.S.C. section 360bbb-3(b)(1), unless the authorization is terminated or revoked.  Performed at [facility], [address], [place], [place] [address]   Urine Culture     Status: Abnormal   Collection Time: 03/31/20 10:27 PM   Specimen: In/Out Cath  Urine  Result Value Ref Range Status   Specimen Description IN/OUT CATH URINE  Final   Special Requests   Final    NONE Performed at [facility], [place] [address], [place], [place] [address]    Culture MULTIPLE SPECIES PRESENT, SUGGEST RECOLLECTION (A)  Final   Report Status 04/02/2020 FINAL  Final  Urine culture     Status: Abnormal   Collection Time: 04/01/20  1:57 PM   Specimen: Urine, Random  Result Value Ref Range Status   Specimen Description URINE, RANDOM  Final   Special Requests   Final    LEFT KIDNEY Performed at [facility], [place] [address] [place], [place] [address]    Culture MULTIPLE SPECIES PRESENT, SUGGEST RECOLLECTION (A)  Final   Report Status 04/02/2020 FINAL  Final  Surgical pcr screen     Status: None   Collection Time: 04/13/20  4:48 AM   Specimen: Nasal Mucosa; Nasal Swab  Result Value Ref Range Status   MRSA, PCR NEGATIVE NEGATIVE Final   Staphylococcus aureus NEGATIVE NEGATIVE Final    Comment: (NOTE) The Xpert SA Assay (FDA approved for NASAL specimens in patients 18 years of age and older), is one component of a comprehensive surveillance program. It is not intended to diagnose infection nor to guide or monitor treatment. Performed at [facility], [place] [address], [place], [place] [address]     Labs: Results for orders placed or performed during the hospital encounter of 03/31/20 (from the past 48 hour(s))  Glucose, capillary     Status: Abnormal   Collection Time: 04/22/20 11:23 AM  Result Value Ref Range   Glucose-Capillary 105 (H) 70 - 99 mg/dL    Comment: Glucose reference range applies only to samples taken after fasting for at least 8 hours.  Glucose, capillary     Status: None   Collection Time: 04/22/20  4:14 PM  Result Value Ref Range   Glucose-Capillary 96 70 - 99 mg/dL    Comment: Glucose reference range applies only to samples taken after fasting for at least 8 hours.  Glucose, capillary     Status: Abnormal    Collection Time: 04/22/20  8:10 PM  Result Value Ref Range   Glucose-Capillary 165 (H) 70 - 99 mg/dL    Comment: Glucose reference range applies only to samples taken after fasting for at least 8 hours.  Glucose, capillary     Status: None   Collection Time: 04/23/20 12:00 AM  Result Value Ref Range   Glucose-Capillary 92 70 - 99 mg/dL    Comment: Glucose reference range applies only to samples taken after fasting for at least 8 hours.  Basic metabolic panel     Status: Abnormal   Collection Time: 04/23/20  1:44 AM  Result Value Ref Range   Sodium 139 135 - 145 mmol/L   Potassium 4.4 3.5 - 5.1 mmol/L   Chloride 106 98 - 111 mmol/L   CO2 24 22 - 32 mmol/L   Glucose, Bld 98 70 - 99 mg/dL    Comment: Glucose reference range applies only to samples taken after fasting for at least 8 hours.   BUN 29 (H) 6 - 20 mg/dL   Creatinine, Ser 4.57 (H) 0.61 - 1.24 mg/dL   Calcium 8.0 (L) 8.9 - 10.3 mg/dL   GFR, Estimated 14 (L) >60 mL/min    Comment: (NOTE) Calculated using the CKD-EPI Creatinine Equation (2021)    Anion gap 9 5 - 15    Comment: Performed at [facility] [address], [place], [place] [address]  CBC     Status: Abnormal   Collection Time: 04/23/20  1:44 AM  Result Value Ref Range   WBC 6.6 4.0 - 10.5 K/uL   RBC 2.99 (L) 4.22 - 5.81 MIL/uL   Hemoglobin 8.9 (L) 13.0 - 17.0 g/dL   HCT 29.5 (L) 39.0 - 52.0 %   MCV 98.7 80.0 - 100.0 fL   MCH 29.8 26.0 - 34.0 pg   MCHC 30.2 30.0 - 36.0 g/dL   RDW 17.4 (H) 11.5 - 15.5 %   Platelets 312 150 - 400 K/uL   nRBC 0.0 0.0 - 0.2 %    Comment: Performed at [facility], [place] [address] [place], [place] [address]  Glucose, capillary     Status: None   Collection Time: 04/23/20  4:25 AM  Result Value Ref Range   Glucose-Capillary 96 70 - 99 mg/dL    Comment: Glucose reference range applies only to samples taken after fasting for at least 8 hours.  Glucose, capillary     Status: None   Collection Time: 04/23/20  7:44 AM   Result Value Ref Range   Glucose-Capillary 89 70 - 99 mg/dL    Comment: Glucose reference range applies only to samples taken after fasting for at least 8 hours.  Glucose, capillary     Status: Abnormal   Collection Time: 04/23/20  1:47 PM  Result Value Ref Range   Glucose-Capillary 103 (H) 70 - 99 mg/dL    Comment: Glucose reference range applies only to samples taken after fasting for at least 8 hours.  Glucose, capillary     Status: Abnormal   Collection Time: 04/23/20  5:50 PM  Result Value Ref Range   Glucose-Capillary 241 (H) 70 - 99 mg/dL    Comment: Glucose reference range applies only to samples taken after fasting for at least 8 hours.  Glucose, capillary     Status: Abnormal   Collection Time: 04/23/20  9:25 PM  Result Value Ref Range   Glucose-Capillary 141 (H) 70 - 99 mg/dL    Comment: Glucose reference range applies only to samples taken after fasting for at least 8 hours.  Basic metabolic panel     Status: Abnormal   Collection Time: 04/24/20  2:28 AM  Result Value Ref Range  Sodium 135 135 - 145 mmol/L   Potassium 3.9 3.5 - 5.1 mmol/L   Chloride 106 98 - 111 mmol/L   CO2 21 (L) 22 - 32 mmol/L   Glucose, Bld 96 70 - 99 mg/dL    Comment: Glucose reference range applies only to samples taken after fasting for at least 8 hours.   BUN 32 (H) 6 - 20 mg/dL   Creatinine, Ser 5.06 (H) 0.61 - 1.24 mg/dL   Calcium 7.5 (L) 8.9 - 10.3 mg/dL   GFR, Estimated 13 (L) >60 mL/min    Comment: (NOTE) Calculated using the CKD-EPI Creatinine Equation (2021)    Anion gap 8 5 - 15    Comment: Performed at [facility] [address] [place], [place] [address]    Imaging / Studies: No results found.  Medications / Allergies: per chart  Antibiotics: Anti-infectives (From admission, onward)   Start     Dose/Rate Route Frequency Ordered Stop   04/19/20 0200  cefoTEtan (CEFOTAN) 2 g in sodium chloride 0.9 % 100 mL IVPB        2 g 200 mL/hr over 30 Minutes Intravenous  Every 12 hours 04/18/20 1824 04/19/20 0238   04/18/20 1200  cefoTEtan (CEFOTAN) 2 g in sodium chloride 0.9 % 100 mL IVPB        2 g 200 mL/hr over 30 Minutes Intravenous To ShortStay Surgical 04/17/20 2206 04/18/20 1410   04/17/20 2215  neomycin (MYCIFRADIN) tablet 1,000 mg       "And" Linked Group Details   1,000 mg Oral  Once 04/17/20 2206 04/17/20 2348   04/17/20 2215  metroNIDAZOLE (FLAGYL) tablet 1,000 mg       "And" Linked Group Details   1,000 mg Oral  Once 04/17/20 2206 04/17/20 2336   04/13/20 1145  cefoTEtan (CEFOTAN) 2 g in sodium chloride 0.9 % 100 mL IVPB        2 g 200 mL/hr over 30 Minutes Intravenous On call to O.R. 04/12/20 2006 04/14/20 0559   04/12/20 2100  neomycin (MYCIFRADIN) tablet 1,000 mg       "And" Linked Group Details   1,000 mg Oral 3 times per day 04/12/20 2006 04/13/20 0527   04/12/20 2100  metroNIDAZOLE (FLAGYL) tablet 1,000 mg       "And" Linked Group Details   1,000 mg Oral 3 times per day 04/12/20 2006 04/13/20 0527   04/03/20 1600  ceFEPIme (MAXIPIME) 1 g in sodium chloride 0.9 % 100 mL IVPB        1 g 200 mL/hr over 30 Minutes Intravenous Every 24 hours 04/03/20 0918 04/07/20 1108   04/01/20 1600  ceFEPIme (MAXIPIME) 1 g in sodium chloride 0.9 % 100 mL IVPB  Status:  Discontinued        1 g 200 mL/hr over 30 Minutes Intravenous Every 24 hours 03/31/20 1550 04/03/20 0918   04/01/20 1145  cefTRIAXone (ROCEPHIN) 1 g in sodium chloride 0.9 % 100 mL IVPB        1 g 200 mL/hr over 30 Minutes Intravenous  Once 04/01/20 1057 04/01/20 1308   03/31/20 1415  ceFEPIme (MAXIPIME) 2 g in sodium chloride 0.9 % 100 mL IVPB        2 g 200 mL/hr over 30 Minutes Intravenous  Once 03/31/20 1407 03/31/20 1516        Note: Portions of this report may have been transcribed using voice recognition software. Every effort was made to ensure accuracy; however, inadvertent  computerized transcription errors may be present.   Any transcriptional errors that result from  this process are unintentional.    Adin Hector, MD, FACS, MASCRS Gastrointestinal and Minimally Invasive Surgery  [facility] [address] [place],  [address] [phone] Fax [phone] Main/Paging  CONTACT INFORMATION: Weekday (9AM-5PM) concerns: Call [facility] main office at [phone] Weeknight (5PM-9AM) or Weekend/Holiday concerns: Check [email or web address] for General Surgery [facility] coverage (Please, do not use SecureChat as it is not reliable communication to operating surgeons for immediate patient care)      04/24/2020  7:39 AM

## 2020-04-24 NOTE — Progress Notes (Signed)
OSTOMY EDUCATION: Ostomy bag leaking due to weight of the stool and gas in the bag. Talked with the pt that we would check the bag (and that he could check it) every 2 hours for now and would empty it when it was 1/3-2/3 full to avoid the weight on the bag pulling and creating a leak.  Pt demonstrated himself the following: Applying the bag to the base Closing the end of the bag Hand heat activation once the bag was placed Taking off the white paper side wings from the base back Pt very engaged and wants to learn this process.  Ordered supplies for the room.

## 2020-04-24 NOTE — Progress Notes (Signed)
Colostomy education:   2 piece cut to fit appliance leaking this am. Took appliance off and showed pt how to cut a pattern and keep it. Utilized skin barrier and explained to pt that it provides an extra layer of skin and protects skin surrounding the stoma.  Stoma is beefy red in color and explained to pt that the stoma size may decrease over time.  Used warm washcloths to clean the stoma. Applied base of 2 piece appliance to bag and explained to pt that it is like tupperware and to apply it prior to placing it on the skin.  Used hand warmth to activate seal on base and pt understands how this works.  Placed appliance so it can be emptied into the toilet and told pt that we would be transitioning to this position.  Pt observed how to fold up the end of the bag and press to seal.  I will continue working with pt on ostomy education.

## 2020-04-24 NOTE — Progress Notes (Addendum)
[facility] PROGRESS NOTE  Chase [name] TDV:[id number] DOB: 11-Feb-1966 DOA: 03/31/2020 PCP: [name]      Status: Remains inpatient appropriate because:Unsafe d/c plan and Inpatient level of care appropriate due to severity of illness   Dispo: The patient is from: Home              Anticipated d/c is to: SNF              Anticipated d/c date is: > 3 days              Patient currently is not medically stable to d/c. Barriers to discharge: Status post diverting loop colostomy as well as internal stent placement, PT recommending SNF at present we have not received any bed offers from [place] affiliated facilities   Code Status: Full code-1/20 d/w pt DNR option-explained clinically given severe deconditioning and malnutrition along w/ multiple ongoing medical issues he would likely not survive a mjor medical vent that would require resuscitation-said wants to d/w sons Family Communication: Patient; at length with son Chase [name] on 1/19. DVT prophylaxis: SQ heparin Vaccination status: Did receive 1 dose of the Moderna COVID-vaccine.  Subsequently refusing any future doses or even a booster shot.  States he had COVID 3 months ago and has reduced immune system and does not feel these immunizations are effective for him.  Foley catheter: Presented with chronic Foley catheter, also has bilateral percutaneous nephrostomy tubes  HPI: HPI on 03/31/2020 by Dr. 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 Patient was reportedly seen by neighbors yesterday in a recliner and was then found on the floor today obtunded. He was sent to [facility] emergency department and is unable to provide history due to his clinical condition.  Interim history Admitted with acute renal failure on chronic kidney disease, stage V secondary to urinary obstruction and dehydration with electrolyte abnormalities.  Nephrology consulted.  Patient did undergo hemodialysis.  IR placed bilateral percutaneous nephrostomy tubes.  Creatinine did improve with a couple cycles of hemodialysis however plateaued.  Hemodialysis catheter was then removed.  No further plans for hemodialysis at this time.  Nephrology feels that patient can be discharged home with outpatient follow-up.  At this time, discharge is complicated by patient being extremely weak and deconditioned.  Initially patient did not want to be discharged to SNF however this morning he states that he will consider it.   Subjective: Awake and cheerful.  Eating snacks.  Requesting resumption of preadmission medications to treat PTSD.  Explained in detail to patient why alpha-blocker such as Minipress and Flomax not currently being used in the context of orthostatic hypotension.  Patient stated he had previously have this during last recovery and as he continued to improve with activity and more frequent out of bed the orthostasis resolved.  Objective: Vitals:   04/23/20 2136 04/24/20 0353  BP: 117/81 (!) 134/54  Pulse: 90 89  Resp: 18 19  Temp: 98 F (36.7 C) 98 F (36.7 C)  SpO2: 97% 100%    Intake/Output Summary (  Last 24 hours) at 04/24/2020 0855 Last data filed at 04/24/2020 0354 Gross per 24 hour  Intake 1110.61 ml  Output 2100 ml  Net -989.39 ml   Filed Weights   04/18/20 0610 04/20/20 0500 04/24/20 0500  Weight: 52.4 kg 55.6 kg 59.2 kg    Exam: General: Alert, no acute distress Respiratory:  Bilateral lung sounds clear to auscultation, stable on room air, color pink Cardiovascular: Normal heart sounds, pulse regular nontachycardic, appropriate capillary refill Abdomen: Colostomy stoma pink with liquid yellow-brown stool noted, abdomen soft nontender with normoactive bowel sounds Genitourinary: Bilateral percutaneous nephrostomy tubes capped; also has chronic Foley catheter draining yellow urine to bedside bag Neurologic: CN 2-12 grossly intact. Sensation intact, DTR normal. Strength 5/5 x all 4 extremities.  Psychiatric: Normal judgment and insight. Alert and oriented x 3.  Flat mood and affect.    Assessment/Plan: Acute problems: Acute renal failure on chronic kidney disease, stage V -Nephrology consulted and appreciated -Creatinine 5.76 in October 2021, although patient presented with a creatinine of 16.69 -Chronic kidney disease 2/2 longstanding obstruction.  Acute kidney injury due to dehydration and sepsis along with ATN with obstruction. -Interventional radiology consulted for bilateral percutaneous nephrostomy tube placement, 04/01/2020 as well as temporary HD catheter placement. -Patient did undergo hemodialysis, 2 cycles with last hemodialysis on 04/04/2019. -Creatinine has now plateaued, currently 5.08 -Though creatinine is still elevated, it is below his baseline.  Patient himself is unsure about wanting continuous HD.   -Nephrology recommended that patient follow-up with nephrology as an outpatient.   -Recommended continuing bicarb on discharge.  Recurrent orthostasis -Likely from Flomax-has chronic Foley catheter therefore do not see any harm at this juncture w/ discontinuing Flomax -General surgery has ordered cosyntropin stimulation test which is normal -Recommend checking orthostatic vital signs every shift  Bilateral hydronephrosis s/p cystoscopy,B ureteral stent placement w/ B retrograde pyelogram/bladder outlet obstruction -Per IR follow-up on 04/07/2019, patient  had urinary obstruction secondary to pelvic mass with subsequent bilateral hydronephrosis in the setting of bilateral ureteral stents, bilateral PCNs inadvertently removed (unknown how long they have been out), status post bilateral PCN placement on 04/01/2020. -1/18 Dr Alinda Money performing concurrent procedure with surgical team (cystoscopy and bilateral ureteral stent change)  -Nephrostomy tubes capped but with slight worsening of renal function will not be removed as of yet.  Potential removal under fluoroscopy on 1/25 -Presented w/ chronic Foley catheter as well. -Also has history of Hodgkin's lymphoma and is status postchemotherapy and radiation which may have also contributed to recurrent obstructive renal disease -Continue Foley until can tolerate alpha-blocker-need to delay consideration for discontinuation of Foley until outpatient setting  History of pelvic abscess/known colovesical fistula s/p lap sigmoid colectomy w/epigastric hernia repair (1/18) -Noted on 11/15/2019, status post left transgluteal drain by IR/colovesicular fistula -1/18 she will undergo diverting loop colostomy by [name] in conjunction with urological procedure by Dr. Alinda Money as described above -Primary GI is Dr. Rush Landmark. Dr Carlean Purl plans on preop colonoscopy 1/18 (today) -Cont scheduled Zofran for recurrent nausea -Follow-up imaging reveals unexplained contrast rectal stump -1/24 patient is participating with self care regarding colostomy-states assisted with changing of bag today  Goals of care -Palliative care consulted and appreciated -Patient full code and refused to discuss any decisions regarding plan of care -Patient should follow up with palliative care as an outpatient -1/19 D/w with patient's son Chase [name].  Recommendation is to have eventual discussion with patient and if necessary family regarding instituting DNR status.  It is felt by the medical team  that patient is so poorly conditioned that he would not  tolerate a resuscitative event and likely would not survive.  Emphasis would be on DNR clarification and not comfort care since at this juncture patient appears to be improving slightly although long-term prognosis is not good.  1/20 also discussed with patient and he agrees DNR at this juncture would be warranted given his poor status noting he would not likely survive a resuscitation event.  He wishes to discuss with family first  Severe physical deconditioning, history of prior CVA -July 2021, history of atrial fibrillation, patient is not a candidate for anticoagulation due to GI bleed and thrombocytopenia -Currently no focal deficits  Severe protein calorie malnutrition Nutrition Problem: Severe Malnutrition Etiology: chronic illness (colovesical fistula) Signs/Symptoms: moderate fat depletion,severe fat depletion,moderate muscle depletion,severe muscle depletion Interventions: MVI,Refer to RD note for recommendations Estimated body mass index is 21.07 kg/m as calculated from the following:   Height as of this encounter: 5\' 6"  (1.676 m).   Weight as of this encounter: 59.2 kg. -Postoperatively patient is reporting increased appetite.  Continue to follow intake.  May require calorie count Chronic pain -Pt was taking a massive dose of Neurontin PTA (800 mg TID)-with his low GFR rec is for 200 mg/day- d/w pharmacist and will proceed w/ 100 mg TID  -Continue low-dose Zanaflex   Other problems: Severe dehydration with multiple metabolic derangements as follows: Severe metabolic acidosis, hyperkalemia, hyperphosphatemia -Resolved after rehydration, dialysis and Kayexalate  Severe sepsis secondary to urinary tract infection -Present on admission-sepsis physiology has now resolved -Patient presented with encephalopathy as well as hypothermia and leukocytosis -Blood cultures were negative x2 -Urine culture from 12/31 and 1/1 showed multiple species -CT renal stone study 12/31: Bladder wall  appeared slightly thickened.  No significant fluid collection in pelvis -Patient completed 7 days of IV cefepime  PTSD -1/24 will resume Vistaril 10 mg 3 times daily as needed and tizanidine 2 mg 3 times daily as needed for muscle spasms -Continue preadmission Cymbalta twice daily  History of gastric bypass  -Avoid NSAIDs (unable to use currently in context of chronic kidney disease)  Data Reviewed: Basic Metabolic Panel: Recent Labs  Lab 04/19/20 0057 04/21/20 0628 04/22/20 0033 04/23/20 0144 04/24/20 0228  NA 135 138 138 139 135  K 4.2 3.8 3.8 4.4 3.9  CL 103 106 106 106 106  CO2 20* 23 21* 24 21*  GLUCOSE 141* 90 91 98 96  BUN 25* 23* 26* 29* 32*  CREATININE 4.64* 4.31* 4.46* 4.57* 5.06*  CALCIUM 8.2* 7.5* 7.6* 8.0* 7.5*  MG  --  1.8  --   --   --   PHOS  --  2.6  --   --   --    Liver Function Tests: No results for input(s): AST, ALT, ALKPHOS, BILITOT, PROT, ALBUMIN in the last 168 hours. No results for input(s): LIPASE, AMYLASE in the last 168 hours. No results for input(s): AMMONIA in the last 168 hours. CBC: Recent Labs  Lab 04/18/20 1205 04/19/20 0057 04/21/20 0628 04/23/20 0144  WBC  --  7.9 6.6 6.6  HGB 11.2* 9.1* 7.5* 8.9*  HCT 33.0* 28.9* 24.9* 29.5*  MCV  --  96.3 98.8 98.7  PLT  --  349 287 312   Cardiac Enzymes: No results for input(s): CKTOTAL, CKMB, CKMBINDEX, TROPONINI in the last 168 hours. BNP (last 3 results) No results for input(s): BNP in the last 8760 hours.  ProBNP (last 3 results) No results for input(s):  PROBNP in the last 8760 hours.  CBG: Recent Labs  Lab 04/23/20 0744 04/23/20 1347 04/23/20 1750 04/23/20 2125 04/24/20 0741  GLUCAP 89 103* 241* 141* 123*    No results found for this or any previous visit (from the past 240 hour(s)).   Studies: No results found.  Scheduled Meds: . (feeding supplement) PROSource Plus  30 mL Oral BID BM  . sodium chloride   Intravenous Once  . calcium carbonate  1 tablet Oral TID  .  Chlorhexidine Gluconate Cloth  6 each Topical Daily  . cholecalciferol  1,000 Units Oral Daily  . DULoxetine  60 mg Oral Daily   And  . DULoxetine  30 mg Oral QHS  . feeding supplement  1 Container Oral TID BM  . gabapentin  300 mg Oral QHS  . heparin  5,000 Units Subcutaneous Q8H  . lip balm  1 application Topical BID  . multivitamin with minerals  1 tablet Oral BID  . ondansetron  4 mg Oral Q6H   Or  . ondansetron (ZOFRAN) IV  4 mg Intravenous Q6H  . pantoprazole  40 mg Oral QHS  . polycarbophil  625 mg Oral BID  . sodium bicarbonate  1,300 mg Oral TID  . sodium chloride flush  3 mL Intravenous Q12H  . sodium chloride flush  5 mL Intracatheter Q8H   Continuous Infusions: . sodium chloride 250 mL (04/22/20 1039)    Active Problems:   CKD (chronic kidney disease) stage 5, GFR less than 15 ml/min (HCC)   Chronic pain   Protein-calorie malnutrition, severe   Pelvic abscess with colovescial fistula   Acute kidney failure (HCC)   History of COVID-19   History of gastric bypass   Hodgkin lymphoma, unspecified, unspecified site (HCC)   Metabolic acidosis   Pernicious anemia   Toxic metabolic encephalopathy   PTSD (post-traumatic stress disorder)   Bilateral ureteral obstruction s/p perc nephrosotmy & stenting   Anxiety and depression   History of CVA (cerebrovascular accident)   BMI less than 19,adult   Chronic narcotic use   Anemia in chronic kidney disease   Incisional hernia - epigastric   Sepsis with acute renal failure without septic shock ([place])   DNR (do not resuscitate) discussion   Palliative care by specialist   Adult failure to thrive   Acute respiratory failure with hypoxia ([place])   Diverticulitis of sigmoid colon with abscess s/p colectomy/colostomy Jeanette Caprice) 04/18/2020   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[facility])   Personality disorder    Cachexia ([place])   Consultants:    Procedures:    Antibiotics: Anti-infectives (From admission, onward)   Start     Dose/Rate  Route Frequency Ordered Stop   04/19/20 0200  cefoTEtan (CEFOTAN) 2 g in sodium chloride 0.9 % 100 mL IVPB        2 g 200 mL/hr over 30 Minutes Intravenous Every 12 hours 04/18/20 1824 04/19/20 0238   04/18/20 1200  cefoTEtan (CEFOTAN) 2 g in sodium chloride 0.9 % 100 mL IVPB        2 g 200 mL/hr over 30 Minutes Intravenous To [facility]Stay [facility] 04/17/20 2206 04/18/20 1410   04/17/20 2215  neomycin (MYCIFRADIN) tablet 1,000 mg       "And" Linked Group Details   1,000 mg Oral  Once 04/17/20 2206 04/17/20 2348   04/17/20 2215  metroNIDAZOLE (FLAGYL) tablet 1,000 mg       "And" Linked Group Details   1,000 mg Oral  Once 04/17/20 2206 04/17/20  2336   04/13/20 1145  cefoTEtan (CEFOTAN) 2 g in sodium chloride 0.9 % 100 mL IVPB        2 g 200 mL/hr over 30 Minutes Intravenous On call to O.R. 04/12/20 2006 04/14/20 0559   04/12/20 2100  neomycin (MYCIFRADIN) tablet 1,000 mg       "And" Linked Group Details   1,000 mg Oral 3 times per day 04/12/20 2006 04/13/20 0527   04/12/20 2100  metroNIDAZOLE (FLAGYL) tablet 1,000 mg       "And" Linked Group Details   1,000 mg Oral 3 times per day 04/12/20 2006 04/13/20 0527   04/03/20 1600  ceFEPIme (MAXIPIME) 1 g in sodium chloride 0.9 % 100 mL IVPB        1 g 200 mL/hr over 30 Minutes Intravenous Every 24 hours 04/03/20 0918 04/07/20 1108   04/01/20 1600  ceFEPIme (MAXIPIME) 1 g in sodium chloride 0.9 % 100 mL IVPB  Status:  Discontinued        1 g 200 mL/hr over 30 Minutes Intravenous Every 24 hours 03/31/20 1550 04/03/20 0918   04/01/20 1145  cefTRIAXone (ROCEPHIN) 1 g in sodium chloride 0.9 % 100 mL IVPB        1 g 200 mL/hr over 30 Minutes Intravenous  Once 04/01/20 1057 04/01/20 1308   03/31/20 1415  ceFEPIme (MAXIPIME) 2 g in sodium chloride 0.9 % 100 mL IVPB        2 g 200 mL/hr over 30 Minutes Intravenous  Once 03/31/20 1407 03/31/20 1516       Time spent:  20 minutes    Erin Hearing ANP  [facility] 7 am - 330 pm/M-F for  direct patient care and secure chat Please refer to Amion for contact info 24  days

## 2020-04-24 NOTE — Progress Notes (Signed)
Colostomy education done with pt observing this am, will continue to review with pt.

## 2020-04-24 NOTE — Plan of Care (Signed)
  Problem: Health Behavior/Discharge Planning: Goal: Ability to manage health-related needs will improve Outcome: Progressing   Problem: Clinical Measurements: Goal: Ability to maintain clinical measurements within normal limits will improve Outcome: Progressing   Problem: Nutrition: Goal: Adequate nutrition will be maintained Outcome: Progressing   Problem: Elimination: Goal: Will not experience complications related to bowel motility Outcome: Progressing

## 2020-04-25 ENCOUNTER — Inpatient Hospital Stay (HOSPITAL_COMMUNITY): Payer: No Typology Code available for payment source

## 2020-04-25 DIAGNOSIS — N170 Acute kidney failure with tubular necrosis: Secondary | ICD-10-CM | POA: Diagnosis not present

## 2020-04-25 DIAGNOSIS — N135 Crossing vessel and stricture of ureter without hydronephrosis: Secondary | ICD-10-CM | POA: Diagnosis not present

## 2020-04-25 HISTORY — PX: IR NEPHRO TUBE REMOV/FL: IMG2342

## 2020-04-25 LAB — BASIC METABOLIC PANEL
Anion gap: 9 (ref 5–15)
BUN: 34 mg/dL — ABNORMAL HIGH (ref 6–20)
CO2: 22 mmol/L (ref 22–32)
Calcium: 7.6 mg/dL — ABNORMAL LOW (ref 8.9–10.3)
Chloride: 107 mmol/L (ref 98–111)
Creatinine, Ser: 4.7 mg/dL — ABNORMAL HIGH (ref 0.61–1.24)
GFR, Estimated: 14 mL/min — ABNORMAL LOW (ref >60)
Glucose, Bld: 81 mg/dL (ref 70–99)
Potassium: 3.6 mmol/L (ref 3.5–5.1)
Sodium: 138 mmol/L (ref 135–145)

## 2020-04-25 MED ORDER — IOHEXOL 300 MG/ML  SOLN
50.0000 mL | Freq: Once | INTRAMUSCULAR | Status: AC | PRN
  Administered 2020-05-01: 50 mL

## 2020-04-25 MED ORDER — LIDOCAINE HCL (PF) 2 % IJ SOLN
INTRAMUSCULAR | Status: AC
  Filled 2020-04-25: qty 10

## 2020-04-25 NOTE — Plan of Care (Signed)
Updated goals in care plan to reflect goals updated by Alayna Allred on 04/21/20

## 2020-04-25 NOTE — Progress Notes (Addendum)
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 Patient was reportedly seen by neighbors yesterday in a recliner and was then found on the floor today obtunded. He was sent to [facility] emergency department and is unable to provide history due to his clinical condition.  Interim history Admitted with acute renal failure on chronic kidney disease, stage V secondary to urinary obstruction and dehydration with electrolyte abnormalities. Nephrology consulted. Patient did undergo hemodialysis. IR placed bilateral percutaneous nephrostomy tubes. Creatinine did improve with a couple cycles of hemodialysis however plateaued. Hemodialysis catheter was then removed. No further plans for hemodialysis at this time. Nephrology feels that patient can be discharged home with outpatient follow-up. At this time, discharge is complicated by patient being extremely weak and deconditioned.  Assessment & Plan:   AKI on CKD stage V: -Creatinine: Upon arrival 16.69 in the setting of dehydration and sepsis along with ATN with obstruction-IR consulted for bilateral percutaneous nephrostomy tube and temporary HD catheter placement on 04/01/2020  -Underwent hemodialysis-2 cycles with last hemodialysis on 04/04/2019. -Nephrology recommended outpatient follow-up.   Recommended continuing bicarb on discharge.  Appreciate nephrology's help.  Bilateral hydronephrosis status post cystoscopy, ureteral stent placement with retrograde pyelogram/bladder outlet obstruction: -S/p bilateral PCN placement on 04/01/2020 -Appreciate urology's recommendation-plan for bilateral nephrostomy tube removal under fluoroscopy. -Renal function is improving: Creatinine: 4.70 -Continue Foley until can tolerate alpha-blocker-need to delay consideration for discontinuation of Foley until outpatient setting -Flomax is on hold due to syncopal episode.  Stable for DC from urology standpoint-they will continue to peripherally follow and continue additional studies and care if indicated if patient remains hospitalized.  History of pelvic abscess/known colovesical fistula status post lap sigmoid colectomy with epigastric hernia repair: No clear fistulous track seen but contrast noted in rectal stump without clear explanation.  Urology is planning to repeat the study before and after bladder contrast in about 10 to 14 days for further evaluation.  Recurrent orthostatics: -Flomax is on hold.  Cosyntropin stimulation test: WNL. -On fall precautions.  Monitor vitals closely.  Severe physical deconditioning/severe protein calorie malnutrition History of prior CVA -Appreciate input from dietitian-continue MVI  Chronic pain: Continue gabapentin, continue low dose Zanaflex  Severe sepsis in the setting of urinary tract infection: -Patient completed 7 days of IV cefepime.  Sepsis resolved.  Blood culture negative x2.  PTSD: Continue Cymbalta, Vistaril, tizanidine as needed for muscle spasm.  DVT prophylaxis: Heparin Code Status: Full code Family Communication:  None present at bedside.  Plan of care discussed with patient in length and he verbalized understanding and agreed with it. Disposition Plan: SNF  Consultants:   Nephrology  Urology  IR  Procedures:   As  above  Antimicrobials:   As above  Status is: Inpatient   Dispo:  Patient From: Home  Planned Disposition: [place]  Expected discharge date: 04/26/2020  Medically stable for discharge: Yes  Difficult to place patient: Yes  Subjective: Patient seen and examined.  Tells me that he is doing well this morning.  No new complaints.  Remained afebrile.  No acute events overnight.  Objective: Vitals:   04/24/20 0500 04/24/20 1037 04/24/20 2001 04/25/20 0526  BP:  118/83 121/66 112/69  Pulse:  85 62 (!) 56  Resp:  18 17 16   Temp:  98.2 F (36.8 C) 97.9 F (36.6 C) 97.9 F (36.6 C)  TempSrc:  Oral Oral Oral  SpO2:  97% 100% 100%  Weight: 59.2 kg   58.4 kg  Height:        Intake/Output Summary (Last 24 hours) at 04/25/2020 1346 Last data filed at 04/25/2020 1121 Gross per 24 hour  Intake 715 ml  Output 3290 ml  Net -2575 ml   Filed Weights   04/20/20 0500 04/24/20 0500 04/25/20 0526  Weight: 55.6 kg 59.2 kg 58.4 kg    Examination:  General exam: Appears calm and comfortable, on room air, appears thin and lean, communicating well Respiratory system: Clear to auscultation. Respiratory effort normal. Cardiovascular system: S1 & S2 heard, RRR. No JVD, murmurs, rubs, gallops or clicks. No pedal edema.: Gastrointestinal system: Abdomen is nondistended, soft and nontender. No organomegaly or masses felt. Normal bowel sounds heard.  Colostomy bag noted with yellow-brown stool.  Foley in placed Central nervous system: Alert and oriented. No focal neurological deficits. Extremities: Symmetric 5 x 5 power. Skin: No rashes, lesions or ulcers Psychiatry: Judgement and insight appear normal. Mood & affect appropriate.    Data Reviewed: I have personally reviewed following labs and imaging studies  CBC: Recent Labs  Lab 04/19/20 0057 04/21/20 0628 04/23/20 0144  WBC 7.9 6.6 6.6  HGB 9.1* 7.5* 8.9*  HCT 28.9* 24.9* 29.5*  MCV 96.3 98.8 98.7  PLT 349 287  947   Basic Metabolic Panel: Recent Labs  Lab 04/21/20 0628 04/22/20 0033 04/23/20 0144 04/24/20 0228 04/25/20 0308  NA 138 138 139 135 138  K 3.8 3.8 4.4 3.9 3.6  CL 106 106 106 106 107  CO2 23 21* 24 21* 22  GLUCOSE 90 91 98 96 81  BUN 23* 26* 29* 32* 34*  CREATININE 4.31* 4.46* 4.57* 5.06* 4.70*  CALCIUM 7.5* 7.6* 8.0* 7.5* 7.6*  MG 1.8  --   --   --   --   PHOS 2.6  --   --   --   --    GFR: Estimated Creatinine Clearance: 14.5 mL/min (A) (by C-G formula based on SCr of 4.7 mg/dL (H)). Liver Function Tests: No results for input(s): AST, ALT, ALKPHOS, BILITOT, PROT, ALBUMIN in the last 168 hours. No results for input(s): LIPASE, AMYLASE in the last 168 hours. No results for input(s): AMMONIA in the last 168 hours. Coagulation Profile: No results for input(s): INR, PROTIME in the last 168 hours. Cardiac Enzymes: No results for input(s): CKTOTAL, CKMB, CKMBINDEX, TROPONINI in the last 168 hours. BNP (last 3 results) No results for input(s): PROBNP in the last 8760 hours. HbA1C: No results for input(s): HGBA1C in the last 72 hours. CBG: Recent Labs  Lab 04/23/20 1750 04/23/20 2125 04/24/20 0741 04/24/20 1211 04/24/20 1640  GLUCAP 241* 141* 123* 99 103*   Lipid Profile: No results for input(s): CHOL, HDL, LDLCALC, TRIG, CHOLHDL, LDLDIRECT in the last 72 hours. Thyroid Function Tests: No results for input(s): TSH, T4TOTAL, FREET4, T3FREE, THYROIDAB in the last 72 hours. Anemia Panel: No results for input(s): VITAMINB12, FOLATE, FERRITIN, TIBC, IRON, RETICCTPCT in the last 72  hours. Sepsis Labs: No results for input(s): PROCALCITON, LATICACIDVEN in the last 168 hours.  No results found for this or any previous visit (from the past 240 hour(s)).    Radiology Studies: No results found.  Scheduled Meds: . (feeding supplement) PROSource Plus  30 mL Oral BID BM  . sodium chloride   Intravenous Once  . calcium carbonate  1 tablet Oral TID  . Chlorhexidine  Gluconate Cloth  6 each Topical Daily  . cholecalciferol  1,000 Units Oral Daily  . DULoxetine  60 mg Oral Daily   And  . DULoxetine  30 mg Oral QHS  . feeding supplement  1 Container Oral TID BM  . gabapentin  300 mg Oral QHS  . heparin  5,000 Units Subcutaneous Q8H  . lip balm  1 application Topical BID  . multivitamin with minerals  1 tablet Oral BID  . pantoprazole  40 mg Oral QHS  . polycarbophil  625 mg Oral BID  . sodium bicarbonate  1,300 mg Oral TID  . sodium chloride flush  3 mL Intravenous Q12H  . sodium chloride flush  5 mL Intracatheter Q8H   Continuous Infusions: . sodium chloride 250 mL (04/22/20 1039)     LOS: 25 days   Time spent: 35 minutes   Nazaret Chea Loann Quill, MD [facility]  If 7PM-7AM, please contact night-coverage [email or web address] 04/25/2020, 1:46 PM

## 2020-04-25 NOTE — Progress Notes (Signed)
Patient ID: Chase [name]., male   DOB: October 13, 1965, 55 y.o.   MRN: [id number]  7 Days Post-Op Subjective: No new changes.  Objective: Vital signs in last 24 hours: Temp:  [97.9 F (36.6 C)] 97.9 F (36.6 C) (01/25 0526) Pulse Rate:  [56-62] 56 (01/25 0526) Resp:  [16-17] 16 (01/25 0526) BP: (112-121)/(66-69) 112/69 (01/25 0526) SpO2:  [100 %] 100 % (01/25 0526) Weight:  [58.4 kg] 58.4 kg (01/25 0526)  Intake/Output from previous day: 01/24 0701 - 01/25 0700 In: 535 [P.O.:535] Out: 3390 [Urine:2290; Stool:1100] Intake/Output this shift: No intake/output data recorded.    Lab Results: Recent Labs    04/23/20 0144  HGB 8.9*  HCT 29.5*   BMET Recent Labs    04/24/20 0228 04/25/20 0308  NA 135 138  K 3.9 3.6  CL 106 107  CO2 21* 22  GLUCOSE 96 81  BUN 32* 34*  CREATININE 5.06* 4.70*  CALCIUM 7.5* 7.6*     Assessment/Plan: 1) Colovesical fistula:CT cystogram reviewed last week. No clear fistulous tract seen but contrast noted in rectal stump without clear explanation. Will consider repeat study before and after bladder contrast in about 10-14 days for further evaluation. 2) Bilateral ureteral obstruction:Bilateral nephrostomy tubes capped over the weekend. Renal function better today indicating no concerning trend.  Cr consistent with baseline.  Spoke with Dr. Annamaria Boots in IR today and will plan for bilateral nephrostomy tube removal under fluoroscopy.   3) Bladder outlet obstruction: Will address this last after addressing above issues. Tamsulosin on hold due to syncopal episode.  Can try to rechallenge him with alpha blocker when appropriate. Will eventually proceed with a voiding trial (possibly as outpatient).Likely will delay this until issue of fistula is definitively determined.  -  Pt is stable for d/c from a urologic standpoint.  However, if he remains hospitalized, will continue to peripherally follow and continue additional studies and care as  indicated if appropriate timing.  Otherwise, will proceed with further evaluation as an outpatient.    LOS: 25 days   [name] 04/25/2020, 10:39 AM

## 2020-04-25 NOTE — Progress Notes (Signed)
Physical Therapy Treatment Patient Details Name: Chase BUCCHERI Sr. MRN: [id number] DOB: 1965-09-16 Today's Date: 04/25/2020    History of Present Illness 55 year old male with PMH of Hodgkin's lymphoma s/p chemotherapy 2011-2012, gastric bypass in 7893 complicated by anastomotic ulcer in 2014, colovesical fistula with abscess s/p PC drain, stage IV CKD, bilateral ureteral stenosis with ureteral stents and bilateral percutaneous nephrostomy tubes, history of CVA in 2021 (residual L sided weakness). He was admitted for acute renal failure complicating stage V CKD secondary to urinary obstruction and dehydration with severe electrolyte derangements.  Urology was consulted.  Patient underwent hemodialysis. s/p ureteral stent placement, retrograde pyelogram, lap sigmoid colectomy with colostomy, and epigastric hernia repair on 1/18. B PCN tubes removed 1/25.    PT Comments    Pt supine on arrival, agreeable to bed-level therapy session but deferring EOB/OOB mobility due to pain post-procedure. Pt with fair tolerance and good participation in supine BLE/BUE AROM therapeutic exercises and given HEP handout (link: [email or web address] Access Code: [email or web address]) to perform BID/TID as able. Reviewed pressure offloading strategies, pt min guard for rolling in bed and +44maxA for posterior supine scoot toward HOB. Pt continues to benefit from PT services to progress toward functional mobility goals. Continue to recommend SNF post-acute.  Follow Up Recommendations  SNF     Equipment Recommendations  None recommended by PT    Recommendations for Other Services       Precautions / Restrictions Precautions Precautions: Fall Precaution Comments: colostomy; orthostatic HTN Restrictions Weight Bearing Restrictions: No    Mobility  Bed Mobility Overal bed mobility: Needs Assistance Bed Mobility: Rolling Rolling: Min guard         General bed mobility comments: pt rolled x3 reps for repositioning  of bed pads, needs maxA +2 for posterior supine scooting toward [facility]  Transfers                 General transfer comment: pt deferring due to pain this date  Ambulation/Gait                 Stairs             Wheelchair Mobility    Modified Rankin (Stroke Patients Only)       Balance                                            Cognition Arousal/Alertness: Awake/alert Behavior During Therapy: WFL for tasks assessed/performed Overall Cognitive Status: Within Functional Limits for tasks assessed Area of Impairment: Awareness                               General Comments: Continues to want to work to get better and stronger--limited by post-procedure pain today and keeping eyes closed throughout session      Exercises General Exercises - Upper Extremity Shoulder Flexion: AROM;Strengthening;Both;5 reps;Supine General Exercises - Lower Extremity Ankle Circles/Pumps: AROM;Both;10 reps;Supine Short Arc Quad: AROM;Strengthening;Both;5 reps (bed in chair position) Heel Slides: AROM;Both;Supine;5 reps Hip ABduction/ADduction: AROM;Both;Supine;5 reps Straight Leg Raises: AROM;Both;Supine;5 reps Hip Flexion/Marching: AROM;Both;5 reps;Other (comment) (bed in chair position) Other Exercises Other Exercises: Shoulder alphabet x5 reps ea    General Comments General comments (skin integrity, edema, etc.): sacral redness, RN in to change mepilex pad at beginning of session, gown changed due to betadine stains post-op  Pertinent Vitals/Pain Pain Assessment: Faces Faces Pain Scale: Hurts even more Pain Location: LBP/where tubes removed Pain Descriptors / Indicators: Grimacing;Discomfort;Sore Pain Intervention(s): Limited activity within patient's tolerance;Monitored during session;Repositioned    Home Living                      Prior Function            PT Goals (current goals can now be found in the care plan  section) Acute Rehab PT Goals Patient Stated Goal: less pain and be able to move without getting dizzy PT Goal Formulation: With patient Time For Goal Achievement: 05/05/20 Potential to Achieve Goals: Fair Progress towards PT goals: Progressing toward goals    Frequency    Min 3X/week      PT Plan Current plan remains appropriate    Co-evaluation              AM-PAC PT "6 Clicks" Mobility   Outcome Measure  Help needed turning from your back to your side while in a flat bed without using bedrails?: A Little Help needed moving from lying on your back to sitting on the side of a flat bed without using bedrails?: A Little Help needed moving to and from a bed to a chair (including a wheelchair)?: A Little Help needed standing up from a chair using your arms (e.g., wheelchair or bedside chair)?: A Little Help needed to walk in hospital room?: A Little Help needed climbing 3-5 steps with a railing? : A Lot 6 Click Score: 17    End of Session   Activity Tolerance: Patient limited by pain Patient left: in bed;with call bell/phone within reach;with bed alarm set;with family/visitor present (brother entered room toward end of session) Nurse Communication: Mobility status;Patient requests pain meds (pt needs TED hose order) PT Visit Diagnosis: Unsteadiness on feet (R26.81);Difficulty in walking, not elsewhere classified (R26.2);Muscle weakness (generalized) (M62.81)     Time: 4709-6283 PT Time Calculation (min) (ACUTE ONLY): 24 min  Charges:  $Therapeutic Exercise: 8-22 mins $Therapeutic Activity: 8-22 mins                     Cindee Mclester P., PTA Acute Rehabilitation Services Pager: [phone] Office: [place] 04/25/2020, 4:07 PM

## 2020-04-25 NOTE — Procedures (Signed)
Interventional Radiology Procedure Note  Procedure: bilateral pcn removal    Complications: None  Estimated Blood Loss:  0  Findings: Full report in pacs     Tamera Punt, MD

## 2020-04-25 NOTE — Plan of Care (Signed)
  Problem: Education: Goal: Knowledge of General Education information will improve Description Including pain rating scale, medication(s)/side effects and non-pharmacologic comfort measures Outcome: Progressing   

## 2020-04-25 NOTE — Progress Notes (Signed)
CSW spoke with Narda Rutherford at [facility] who states that there is currently a waiting list for admissions. CSW added patient to the waiting list.  Madilyn Fireman, MSW, LCSW-A Transitions of Care  Clinical Social Worker I [phone]

## 2020-04-26 DIAGNOSIS — Z515 Encounter for palliative care: Secondary | ICD-10-CM | POA: Diagnosis not present

## 2020-04-26 DIAGNOSIS — N135 Crossing vessel and stricture of ureter without hydronephrosis: Secondary | ICD-10-CM | POA: Diagnosis not present

## 2020-04-26 DIAGNOSIS — Z933 Colostomy status: Secondary | ICD-10-CM | POA: Diagnosis not present

## 2020-04-26 DIAGNOSIS — J9601 Acute respiratory failure with hypoxia: Secondary | ICD-10-CM | POA: Diagnosis not present

## 2020-04-26 DIAGNOSIS — R5381 Other malaise: Secondary | ICD-10-CM | POA: Diagnosis not present

## 2020-04-26 DIAGNOSIS — N170 Acute kidney failure with tubular necrosis: Secondary | ICD-10-CM | POA: Diagnosis not present

## 2020-04-26 DIAGNOSIS — N321 Vesicointestinal fistula: Secondary | ICD-10-CM | POA: Diagnosis not present

## 2020-04-26 DIAGNOSIS — R64 Cachexia: Secondary | ICD-10-CM | POA: Diagnosis not present

## 2020-04-26 LAB — CBC
HCT: 26 % — ABNORMAL LOW (ref 39.0–52.0)
Hemoglobin: 8.1 g/dL — ABNORMAL LOW (ref 13.0–17.0)
MCH: 30.2 pg (ref 26.0–34.0)
MCHC: 31.2 g/dL (ref 30.0–36.0)
MCV: 97 fL (ref 80.0–100.0)
Platelets: 284 10*3/uL (ref 150–400)
RBC: 2.68 MIL/uL — ABNORMAL LOW (ref 4.22–5.81)
RDW: 16.9 % — ABNORMAL HIGH (ref 11.5–15.5)
WBC: 5.1 10*3/uL (ref 4.0–10.5)
nRBC: 0 % (ref 0.0–0.2)

## 2020-04-26 LAB — BASIC METABOLIC PANEL
Anion gap: 8 (ref 5–15)
BUN: 34 mg/dL — ABNORMAL HIGH (ref 6–20)
CO2: 22 mmol/L (ref 22–32)
Calcium: 7.5 mg/dL — ABNORMAL LOW (ref 8.9–10.3)
Chloride: 107 mmol/L (ref 98–111)
Creatinine, Ser: 4.42 mg/dL — ABNORMAL HIGH (ref 0.61–1.24)
GFR, Estimated: 15 mL/min — ABNORMAL LOW (ref >60)
Glucose, Bld: 159 mg/dL — ABNORMAL HIGH (ref 70–99)
Potassium: 3.5 mmol/L (ref 3.5–5.1)
Sodium: 137 mmol/L (ref 135–145)

## 2020-04-26 MED ORDER — GABAPENTIN 100 MG PO CAPS
200.0000 mg | ORAL_CAPSULE | Freq: Every day | ORAL | Status: DC
  Administered 2020-04-26 – 2020-05-03 (×8): 200 mg via ORAL
  Filled 2020-04-26 (×8): qty 2

## 2020-04-26 NOTE — Progress Notes (Signed)
[facility] PROGRESS NOTE    Progress Note  Chase [name]  LEX:[id number] DOB: 1965-05-06 DOA: 03/31/2020 PCP: [name]     Brief Narrative:   Chase [name]. is an 55 y.o. 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  Nephrology was consulted and patient underwent hemodialysis due to uremia, IR was consulted and bilateral percutaneous nephrostomy tubes were replaced his creatinine did improve, his dialysis catheter was removed as renal does not plan to do any further hemodialysis on him.    Still awaiting physical therapy placement patient has not received any bed offers [place] he is a [place] affiliated.  Assessment/Plan:   Acute kidney injury on chronic kidney disease stage V: On admission his creatinine was 16.6 in the setting of nephrostomy tubes dislodged bilaterally. Nephrology was consulted and he was started on hemodialysis HD catheter was placed on 04/01/2018 to have removed. Last dialysis on 04/03/2020. Nephrology recommended to follow-up with them as an outpatient. Continue bicarbonate tablets upon discharge. Still waiting skilled nursing facility placement  Bilateral hydronephrosis status post cystoscopy with ureteral stent placement with retrograde pyelogram and bladder outlet obstruction: Status post [facility] placement on 04/01/2018. Appreciate urology's assistance they recommended plan for bilateral nephrostomy tube removal under fluoroscopy done on 04/25/2020. Foley still in place until he can tolerate alpha blockers for discontinuation of Foley  which will be done as an outpatient. Flomax was discontinued due to syncopal episode. Stable to DC from urology standpoint.  History of pelvic abscess/known: Vesicular fistula status post sigmoid colectomy with epigastric hernia repair: No clear fistulous tract seen or contrast noticed on the rectal stump without clear explanation. Urology is planning to repeat this study in 14 days as an outpatient for further evaluation.  Recurrent orthostatics: Flomax is on hold cosyntropin status post unremarkable. Continue to work with physical therapy.  Severe physical deconditioning/protein caloric malnutrition in the setting of history of CVA: Continue PT OT continue MVI per nutrition's recommendations.  Chronic pain: Continue gabapentin low-dose Zanaflex.  Severe sepsis in the setting of urinary tract infection: Blood cultures remain negative x2 he completed 7-day course of antibiotics.  PTSD: Continue Cymbalta Vistaril and tizanidine. RN Pressure Injury Documentation: Pressure Injury 11/08/19 Coccyx Upper Stage 2 -  Partial thickness loss of dermis presenting as a shallow open injury with a red, pink wound bed without slough. unblanchable with an open slit in the middle non draining  (Active)  11/08/19 0300  Location: Coccyx  Location Orientation: Upper  Staging: Stage 2 -  Partial thickness loss of dermis presenting as a shallow open injury with a red, pink wound bed without slough.  Wound Description (Comments): unblanchable with an open slit in the middle non draining   Present on Admission: Yes    Estimated body mass index is 20.78 kg/m as calculated from the following:   Height as of this encounter: 5\' 6"  (1.676 m).   Weight as of this encounter: 58.4 kg. Malnutrition Type:  Nutrition Problem: Severe Malnutrition Etiology: chronic illness (colovesical fistula)   Malnutrition Characteristics:  Signs/Symptoms: moderate fat depletion,severe fat depletion,moderate muscle  depletion,severe muscle depletion   Nutrition Interventions:  Interventions: MVI,Refer to RD note for recommendations    DVT prophylaxis: Lovenox Family Communication: None Status is: Inpatient  Remains inpatient appropriate because:Hemodynamically unstable   Dispo:  Patient From: Home  Planned Disposition: [place]  Expected discharge date: 04/29/2020  Medically stable for discharge: Yes  Difficult to place patient: Yes       Code Status:     Code Status Orders  (From admission, onward)         Start     Ordered   03/31/20 2152  Full code  Continuous        03/31/20 2155        Code Status History    Date Active Date Inactive Code Status Order ID Comments User Context   12/24/2019 1537 01/08/2020 2106 Full Code [id number]  Jonnie Finner, DO ED   11/08/2019 0019 11/19/2019 2232 Full Code [id number]  Jacalyn Lefevre, MD ED   10/17/2019 1832 10/26/2019 1620 Full Code [id number]  Mendel Corning, MD Inpatient   Advance Care Planning Activity        IV Access:    Peripheral IV   Procedures and diagnostic studies:   IR Nephro Tube Remov/FL  Result Date: 04/25/2020 INDICATION: Bilateral nephrostomies, surgically placed internal ureteral stents, assess for nephrostomy removals EXAM: BILATERAL NEPHROSTOMY REMOVAL UNDER FLUOROSCOPY MEDICATIONS: NONE. ANESTHESIA/SEDATION: Moderate Sedation Time:  None. The patient was continuously monitored during the procedure by the interventional radiology nurse under my direct supervision. CONTRAST:  20 cc-administered into the collecting system(s) FLUOROSCOPY TIME:  Fluoroscopy Time: 1 minutes 24 seconds (31 mGy). COMPLICATIONS: None immediate. PROCEDURE: Under sterile conditions, initially the left nephrostomy catheter was injected with contrast. This confirms antegrade flow and drainage in the left internal ureteral stent. Nephrostomy catheter was cut and removed over a guidewire under fluoroscopy without dislodging  the stent. On the right side, the nephrostomy catheter has retracted into the percutaneous tract and is just beneath the skin in the subcutaneous tissues. Therefore the malposition catheter was cut and removed. FINDINGS: As above IMPRESSION: Successful removal of the bilateral nephrostomy catheters Electronically Signed   By: Jerilynn Mages.  Shick M.D.   On: 04/25/2020 14:47     Medical Consultants:    None.  Anti-Infectives:   none  Subjective:    [name] relates he feels great no new complaints wants his Neurontin resumed.  Objective:    Vitals:   04/25/20 0526 04/25/20 1502 04/25/20 2100 04/26/20 0430  BP: 112/69 116/77 131/86 125/78  Pulse: (!) 56 73 72 79  Resp: 16 16 18 18   Temp: 97.9 F (36.6 C) 98.6 F (37 C) 98.9 F (37.2 C) 98.6 F (37 C)  TempSrc: Oral Oral Oral Oral  SpO2: 100% 98% 97% 100%  Weight: 58.4 kg     Height:       SpO2: 100 % O2 Flow Rate (L/min): 2 L/min   Intake/Output Summary (Last 24 hours) at 04/26/2020 1139 Last data filed at 04/26/2020 1046 Gross per 24 hour  Intake 1250.7 ml  Output 2725 ml  Net -1474.3 ml   Filed Weights   04/20/20 0500 04/24/20 0500 04/25/20 0526  Weight: 55.6 kg 59.2 kg 58.4 kg    Exam: General exam: In no acute distress. Respiratory system: Good air movement and clear to auscultation. Cardiovascular system: S1 & S2 heard, RRR. No JVD. Gastrointestinal system: Abdomen is nondistended, soft and nontender.  Extremities: No pedal edema. Skin: No rashes, lesions or ulcers Psychiatry: Judgement and insight  appear normal. Mood & affect appropriate.    Data Reviewed:    Labs: Basic Metabolic Panel: Recent Labs  Lab 04/21/20 0628 04/22/20 0033 04/23/20 0144 04/24/20 0228 04/25/20 0308 04/26/20 0022  NA 138 138 139 135 138 137  K 3.8 3.8 4.4 3.9 3.6 3.5  CL 106 106 106 106 107 107  CO2 23 21* 24 21* 22 22  GLUCOSE 90 91 98 96 81 159*  BUN 23* 26* 29* 32* 34* 34*  CREATININE 4.31* 4.46* 4.57* 5.06*  4.70* 4.42*  CALCIUM 7.5* 7.6* 8.0* 7.5* 7.6* 7.5*  MG 1.8  --   --   --   --   --   PHOS 2.6  --   --   --   --   --    GFR Estimated Creatinine Clearance: 15.4 mL/min (A) (by C-G formula based on SCr of 4.42 mg/dL (H)). Liver Function Tests: No results for input(s): AST, ALT, ALKPHOS, BILITOT, PROT, ALBUMIN in the last 168 hours. No results for input(s): LIPASE, AMYLASE in the last 168 hours. No results for input(s): AMMONIA in the last 168 hours. Coagulation profile No results for input(s): INR, PROTIME in the last 168 hours. COVID-19 Labs  No results for input(s): DDIMER, FERRITIN, LDH, CRP in the last 72 hours.  Lab Results  Component Value Date   SARSCOV2NAA NEGATIVE 03/31/2020   [place] NEGATIVE 01/07/2020   [place] NEGATIVE 12/24/2019   [place] NEGATIVE 11/19/2019    CBC: Recent Labs  Lab 04/21/20 0628 04/23/20 0144 04/26/20 0022  WBC 6.6 6.6 5.1  HGB 7.5* 8.9* 8.1*  HCT 24.9* 29.5* 26.0*  MCV 98.8 98.7 97.0  PLT 287 312 284   Cardiac Enzymes: No results for input(s): CKTOTAL, CKMB, CKMBINDEX, TROPONINI in the last 168 hours. BNP (last 3 results) No results for input(s): PROBNP in the last 8760 hours. CBG: Recent Labs  Lab 04/23/20 1750 04/23/20 2125 04/24/20 0741 04/24/20 1211 04/24/20 1640  GLUCAP 241* 141* 123* 99 103*   D-Dimer: No results for input(s): DDIMER in the last 72 hours. Hgb A1c: No results for input(s): HGBA1C in the last 72 hours. Lipid Profile: No results for input(s): CHOL, HDL, LDLCALC, TRIG, CHOLHDL, LDLDIRECT in the last 72 hours. Thyroid function studies: No results for input(s): TSH, T4TOTAL, T3FREE, THYROIDAB in the last 72 hours.  Invalid input(s): FREET3 Anemia work up: No results for input(s): VITAMINB12, FOLATE, FERRITIN, TIBC, IRON, RETICCTPCT in the last 72 hours. Sepsis Labs: Recent Labs  Lab 04/21/20 0628 04/23/20 0144 04/26/20 0022  WBC 6.6 6.6 5.1   Microbiology No results found for this or  any previous visit (from the past 240 hour(s)).   Medications:   . (feeding supplement) PROSource Plus  30 mL Oral BID BM  . sodium chloride   Intravenous Once  . calcium carbonate  1 tablet Oral TID  . Chlorhexidine Gluconate Cloth  6 each Topical Daily  . cholecalciferol  1,000 Units Oral Daily  . DULoxetine  60 mg Oral Daily   And  . DULoxetine  30 mg Oral QHS  . feeding supplement  1 Container Oral TID BM  . gabapentin  300 mg Oral QHS  . heparin  5,000 Units Subcutaneous Q8H  . lip balm  1 application Topical BID  . multivitamin with minerals  1 tablet Oral BID  . pantoprazole  40 mg Oral QHS  . polycarbophil  625 mg Oral BID  . sodium bicarbonate  1,300 mg Oral TID  . sodium chloride  flush  3 mL Intravenous Q12H  . sodium chloride flush  5 mL Intracatheter Q8H   Continuous Infusions: . sodium chloride 250 mL (04/22/20 1039)      LOS: 26 days   Charlynne Cousins  [facility]  04/26/2020, 11:39 AM

## 2020-04-26 NOTE — Progress Notes (Signed)
Pt up in chair x 2+ hours. Completed seated grooming with set up, arm pumps prior to ambulation and ambulated in room and returned to bed with min guard assist. Pt with improved motivation to return to independence.    04/26/20 1600  OT Visit Information  Last OT Received On 04/26/20  Assistance Needed +1  History of Present Illness 55 year old male with PMH of Hodgkin's lymphoma s/p chemotherapy 2011-2012, gastric bypass in 9417 complicated by anastomotic ulcer in 2014, colovesical fistula with abscess s/p PC drain, stage IV CKD, bilateral ureteral stenosis with ureteral stents and bilateral percutaneous nephrostomy tubes, history of CVA in 2021 (residual L sided weakness). He was admitted for acute renal failure complicating stage V CKD secondary to urinary obstruction and dehydration with severe electrolyte derangements.  Urology was consulted.  Patient underwent hemodialysis. s/p ureteral stent placement, retrograde pyelogram, lap sigmoid colectomy with colostomy, and epigastric hernia repair on 1/18. B PCN tubes removed 1/25.  Precautions  Precautions Fall  Precaution Comments colostomy; orthostatic hypotension  Pain Assessment  Pain Assessment Faces  Faces Pain Scale 2  Pain Location abdomen  Pain Descriptors / Indicators Discomfort;Sore  Pain Intervention(s) Monitored during session;Repositioned  Cognition  Arousal/Alertness Awake/alert  Behavior During Therapy WFL for tasks assessed/performed  Overall Cognitive Status Within Functional Limits for tasks assessed  ADL  Overall ADL's  Needs assistance/impaired  Grooming Oral care;Sitting  Lower Body Dressing Set up;Sitting/lateral leans  Lower Body Dressing Details (indicate cue type and reason) socks  Functional mobility during ADLs Min guard;Rolling walker  General ADL Comments cues to monitor symptoms of hypotension, pumped arms  Bed Mobility  Overal bed mobility Needs Assistance  Bed Mobility Sit to Supine  Sit to supine  Supervision  General bed mobility comments supervision assist for foley  Balance  Overall balance assessment Needs assistance  Sitting balance-Leahy Scale Good  Standing balance support Bilateral upper extremity supported  Standing balance-Leahy Scale Poor  Transfers  Equipment used Rolling walker (2 wheeled)  Transfers Sit to/from Stand  Sit to Stand Min guard  General transfer comment from recliner  Other Exercises  Other Exercises arm pumps x 15 prior to standing  OT - End of Session  Activity Tolerance Patient tolerated treatment well  Patient left in bed;with call bell/phone within reach  OT Assessment/Plan  OT Plan Discharge plan remains appropriate  OT Visit Diagnosis Unsteadiness on feet (R26.81);Other abnormalities of gait and mobility (R26.89);Muscle weakness (generalized) (M62.81);Adult, failure to thrive (R62.7)  OT Frequency (ACUTE ONLY) Min 2X/week  Follow Up Recommendations SNF  OT Equipment Other (comment) (defer to next venue)  AM-PAC OT "6 Clicks" Daily Activity Outcome Measure (Version 2)  Help from another person eating meals? 4  Help from another person taking care of personal grooming? 3  Help from another person toileting, which includes using toliet, bedpan, or urinal? 3  Help from another person bathing (including washing, rinsing, drying)? 3  Help from another person to put on and taking off regular upper body clothing? 3  6 Click Score 16  OT Goal Progression  Progress towards OT goals Progressing toward goals  Acute Rehab OT Goals  Patient Stated Goal to be able to take himself to the bathroom  OT Goal Formulation With patient  Time For Goal Achievement 04/20/20  Potential to Achieve Goals Good  OT Time Calculation  OT Start Time (ACUTE ONLY) 1535  OT Stop Time (ACUTE ONLY) 1619  OT Time Calculation (min) 44 min  OT General Charges  $OT Visit  1 Visit  OT Treatments  $Self Care/Home Management  38-52 mins  Nestor Lewandowsky, OTR/L Acute  Rehabilitation Services Pager: [phone] Office: [phone]

## 2020-04-26 NOTE — Progress Notes (Signed)
Patient ID: Chase [name]., male   DOB: 11/13/1965, 55 y.o.   MRN: [id number]  8 Days Post-Op Subjective: Pt with no new complaints.  No flank pain s/p nephrostomy removal yesterday.  Objective: Vital signs in last 24 hours: Temp:  [98.6 F (37 C)-98.9 F (37.2 C)] 98.6 F (37 C) (01/26 0430) Pulse Rate:  [72-79] 79 (01/26 0430) Resp:  [18] 18 (01/26 0430) BP: (125-131)/(78-86) 125/78 (01/26 0430) SpO2:  [97 %-100 %] 100 % (01/26 0430)  Intake/Output from previous day: 01/25 0701 - 01/26 0700 In: 1095.3 [P.O.:750; I.V.:345.3] Out: 2700 [Urine:1750; Stool:950] Intake/Output this shift: Total I/O In: 755.5 [P.O.:580; I.V.:175.5] Out: 2350 [Urine:2300; Stool:50]  Physical Exam:  General: Alert and oriented GU: Urine clear  Lab Results: Recent Labs    04/26/20 0022  HGB 8.1*  HCT 26.0*   BMET Recent Labs    04/25/20 0308 04/26/20 0022  NA 138 137  K 3.6 3.5  CL 107 107  CO2 22 22  GLUCOSE 81 159*  BUN 34* 34*  CREATININE 4.70* 4.42*  CALCIUM 7.6* 7.5*     Studies/Results: IR Nephro Tube Remov/FL  Result Date: 04/25/2020 INDICATION: Bilateral nephrostomies, surgically placed internal ureteral stents, assess for nephrostomy removals EXAM: BILATERAL NEPHROSTOMY REMOVAL UNDER FLUOROSCOPY MEDICATIONS: NONE. ANESTHESIA/SEDATION: Moderate Sedation Time:  None. The patient was continuously monitored during the procedure by the interventional radiology nurse under my direct supervision. CONTRAST:  20 cc-administered into the collecting system(s) FLUOROSCOPY TIME:  Fluoroscopy Time: 1 minutes 24 seconds (31 mGy). COMPLICATIONS: None immediate. PROCEDURE: Under sterile conditions, initially the left nephrostomy catheter was injected with contrast. This confirms antegrade flow and drainage in the left internal ureteral stent. Nephrostomy catheter was cut and removed over a guidewire under fluoroscopy without dislodging the stent. On the right side, the nephrostomy catheter  has retracted into the percutaneous tract and is just beneath the skin in the subcutaneous tissues. Therefore the malposition catheter was cut and removed. FINDINGS: As above IMPRESSION: Successful removal of the bilateral nephrostomy catheters Electronically Signed   By: Jerilynn Mages.  Shick M.D.   On: 04/25/2020 14:47    Assessment/Plan: 1) Colovesical fistula:CT cystogram reviewedlast week. No clear fistulous tract seen but contrast noted in rectal stump without clear explanation. Will consider repeat study before and after bladder contrast in about 10-14 days for further evaluation. 2) Bilateral ureteral obstruction:Renal function stable s/p nephrostomy tube removal.  Has indwelling stents and will consider changing them or removing them within the next 3 months. 3) Bladder outlet obstruction: Will address this last after addressing above issues. Tamsulosin on hold due to syncopal episode. Can try to rechallenge him with alpha blocker when appropriate. Will eventually proceed with a voiding trial (possibly asoutpatient).Likely will delay this until issue of fistula is definitively determined.  -  Pt is stable for d/c from a urologic standpoint.  However, if he remains hospitalized, will continue to peripherally follow and continue additional studies and care as indicated if appropriate timing.  Otherwise, will proceed with further evaluation as an outpatient.   LOS: 26 days   [name] 04/26/2020, 3:13 PM

## 2020-04-26 NOTE — Progress Notes (Signed)
Physical Therapy Treatment Patient Details Name: Chase FURIA Sr. 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 He was admitted for acute renal failure complicating stage V CKD secondary to urinary obstruction and dehydration with severe electrolyte derangements.  Urology was consulted.  Patient underwent hemodialysis. s/p ureteral stent placement, retrograde pyelogram, lap sigmoid colectomy with colostomy, and epigastric hernia repair on 1/18. B PCN tubes removed 1/25.    PT Comments    Pt supine on arrival, agreeable to therapy session with excellent participation and good tolerance for mobility. Primary session focus on progressing seated/standing tolerance and transfer/pre-gait training. Pt with improved symptoms of orthostatic hypotension but still reporting mild dizziness during standing tasks, however with TED hose donned and increased time/seated arm exercises performed prior to gait task, pt symptoms less prominent.  Orthostatic BPs Position BP HR  Supine -high fowlers position 126/82 (93) 86 bpm  Sitting 108/85 (90) 107 bpm  Sitting after 3 min 126/80 (95) 102 bpm  Standing 99/69 (80) 110 bpm  Seated after standing  121/93 (104) 100 bpm   Pt also performed seated/standing BUE/BLE therapeutic exercises with good tolerance and instructed on pressure offloading. Pt continues to benefit from PT services to progress toward functional mobility goals. D/C recs below remain appropriate.  Follow Up Recommendations  SNF     Equipment Recommendations  None recommended by PT (defer to next location)     Recommendations for Other Services       Precautions / Restrictions Precautions Precautions: Fall Precaution Comments: colostomy; orthostatic hypotension Restrictions Weight Bearing Restrictions: No    Mobility  Bed Mobility Overal bed mobility: Needs Assistance Bed Mobility: Rolling Rolling: Min guard;Supervision Sidelying to sit: Min guard;HOB elevated       General bed mobility comments: use of bed features/HOB elevated, increased time to perform  Transfers Overall transfer level: Needs assistance Equipment used: Rolling walker (2 wheeled) Transfers: Sit to/from Stand Sit to Stand: Min guard         General transfer comment: from EOB x2 reps and from chair x10 reps to RW, cues for hand placement initially with good carryover  Ambulation/Gait Ambulation/Gait assistance: Min guard Gait Distance (Feet): 10 Feet Assistive device: Rolling walker (2 wheeled) Gait Pattern/deviations: Step-to pattern;Decreased step length - right;Decreased step length - left;Trunk flexed Gait velocity: decr   General Gait Details: short steps, trunk remained in kyphotic posture despite cues for forward gaze/upright posture; moderate reliance on RW, pt reports only mild dizziness   Stairs             Wheelchair Mobility    Modified Rankin (Stroke Patients Only)       Balance Overall balance assessment: Needs assistance Sitting-balance support: No upper extremity supported Sitting balance-Leahy Scale: Fair     Standing balance support: Bilateral upper extremity supported Standing balance-Leahy Scale: Poor Standing balance comment: reliant on RW and external assist for safety however no LOB                            Cognition Arousal/Alertness: Awake/alert Behavior During Therapy: WFL for tasks assessed/performed Overall Cognitive Status: Within Functional Limits for tasks  assessed Area of Impairment: Awareness                                General Comments: Continues to want to work to get better and stronger- improved alertness and participation this session      Exercises General Exercises - Upper Extremity Elbow Flexion: AROM;Both;20 reps;Seated (x2 sets, seated EOB/in chair, with shoulders flexed to 90 deg) Elbow Extension: AROM;Both;20 reps;Seated (shld flexed to 90 deg) Chair Push Up: AROM;Strengthening;Both;10 reps;Seated (using arms/legs) General Exercises - Lower Extremity Ankle Circles/Pumps: AROM;Both;10 reps;Supine Long Arc Quad: AROM;Both;10 reps;Seated Hip Flexion/Marching: AROM;Both;10 reps;Seated Other Exercises Other Exercises: STS x5 reps x2 sets (10 total)    General Comments General comments (skin integrity, edema, etc.): sacral redness and some redness at site where PCN tubes removed previous day, area wiped with wet washcloth for relief while pt seated EOB; reviewed strategies for improved orthostatics prior to sitting up/once seated EOB including arm exercises which did improve SBP      Pertinent Vitals/Pain Pain Assessment: Faces Faces Pain Scale: Hurts a little bit Pain Location: LBP/where tubes removed Pain Descriptors / Indicators: Discomfort;Sore Pain Intervention(s): Monitored during session;Repositioned    Home Living                      Prior Function            PT Goals (current goals can now be found in the care plan section) Acute Rehab PT Goals Patient Stated Goal: less pain and be able to move without getting dizzy PT Goal Formulation: With patient Time For Goal Achievement: 05/05/20 Potential to Achieve Goals: Fair Progress towards PT goals: Progressing toward goals    Frequency    Min 3X/week      PT Plan Current plan remains appropriate    Co-evaluation              AM-PAC PT "6 Clicks" Mobility   Outcome Measure  Help needed turning from your back to your side while in a flat bed without using bedrails?: A Little Help needed moving from  lying on your back to sitting on the side of a flat bed without using bedrails?: A Little Help needed moving to and from a bed to a chair (including a wheelchair)?: A Little Help needed standing up from a chair using your arms (e.g., wheelchair or bedside chair)?: A Little Help needed to walk in hospital room?: A Little Help needed climbing 3-5 steps with a railing? : A Lot 6 Click Score: 17    End of Session Equipment Utilized During Treatment: Gait belt Activity Tolerance: Patient tolerated treatment well Patient left: with call bell/phone within reach;in chair;with chair alarm set (NT notified, pressure offloading cushion on chair under pt) Nurse Communication: Mobility status (arm exercises helpful before standing/transfers) PT Visit Diagnosis: Unsteadiness on feet (R26.81);Difficulty in walking, not elsewhere classified (R26.2);Muscle weakness (generalized) (M62.81)     Time: 1323-1401 PT Time Calculation (min) (ACUTE ONLY): 38 min  Charges:  $Gait Training: 8-22 mins $Therapeutic Exercise: 8-22 mins $Therapeutic Activity: 8-22 mins                     Tammi Boulier P., PTA Acute Rehabilitation Services Pager: [phone] Office: [place] 04/26/2020, 2:46 PM

## 2020-04-27 DIAGNOSIS — Z933 Colostomy status: Secondary | ICD-10-CM | POA: Diagnosis not present

## 2020-04-27 DIAGNOSIS — A419 Sepsis, unspecified organism: Secondary | ICD-10-CM | POA: Diagnosis not present

## 2020-04-27 DIAGNOSIS — R64 Cachexia: Secondary | ICD-10-CM | POA: Diagnosis not present

## 2020-04-27 DIAGNOSIS — N321 Vesicointestinal fistula: Secondary | ICD-10-CM | POA: Diagnosis not present

## 2020-04-27 LAB — ALDOSTERONE + RENIN ACTIVITY W/ RATIO
ALDO / PRA Ratio: 8.4 (ref 0.0–30.0)
Aldosterone: 3.7 ng/dL (ref 0.0–30.0)
PRA LC/MS/MS: 0.442 ng/mL/hr (ref 0.167–5.380)

## 2020-04-27 NOTE — Progress Notes (Signed)
Nutrition Follow-up  DOCUMENTATION CODES:   Severe malnutrition in context of chronic illness  INTERVENTION:   -Continue MVI with minerals BID -Continue 500 mg calcium carbonate TID -Continue double protein portions with meals -Continue snacks TID between meals -D/c Prosource Plus, due to poor acceptance   NUTRITION DIAGNOSIS:   Severe Malnutrition related to chronic illness (colovesical fistula) as evidenced by moderate fat depletion,severe fat depletion,moderate muscle depletion,severe muscle depletion.  Ongoing  GOAL:   Patient will meet greater than or equal to 90% of their needs  Progressing   MONITOR:   PO intake,Supplement acceptance,Diet advancement,Labs,Weight trends,Skin,I & O's  REASON FOR ASSESSMENT:   Malnutrition Screening Tool    ASSESSMENT:   55 year old male who presented to the ED on 12/31 after being found on the floor of his home with nephrostomy tubes out. PMH of gastric bypass surgery in 0737 with complications of anastomotic ulcer in 2014, Hodgkin's lymphoma s/p chemotherapy an radiation 2010-2011, colovesical fistula, bilateral ureteral stenosis with CKD and hydronephrosis, CVA in July 2021, PTSDK. Admitted with acute renal failure superimposed on CKD IV, metabolic acidosis, hydronephrosis, and severe sepsis secondary to UTI.  01/01 - s/p bilateral percutaneous nephrostomy tube placement and right IJ non-tunneled HD catheter placement, attempted HD with hypotension, rapid response and PCCM consult 1/3- last HD 1/18- s/p LAPAROSCOPIC SIGMOID COLOTOMY WITH COLOSTOMY (N/A Abdomen) HERNIA REPAIR EPIGASTRIC ADULT (N/A Abdomen) CYSTOSCOPY WITH BILATERAL URETERAL STENT CHANGE PLACEMENT (N/A Urethra) 1/20- advanced to soft diet, bilateral nephrostomy tubes capped 1/23- advanced to regular diet 1/15- bilateral PCN removal  Reviewed I/O's: -4.1 L x 24 hours and -12.2 L since 04/13/20  UOP: 4.4 L x 24 hours  Colostomy output: 800 ml x 24 hours  Pt  unavailable at time of attempted contact.   Pt's intake has improved since previous visit. Noted meal completion 40-100%. Pt is receiving double protein portions with meals. He is refusing supplements (Ensure, Boost Breeze, Prosource, [employer]).   Per MD notes, pt is medically stable for discharge, but awaiting SNF placement.   Medications reviewed and include vitamin D3 and fibercon.   Labs reviewed: CBGS: 99-103.   Diet Order:   Diet Order            Diet regular Room service appropriate? Yes; Fluid consistency: Thin  Diet effective now                 EDUCATION NEEDS:   Education needs have been addressed  Skin:  Skin Assessment: Skin Integrity Issues: Skin Integrity Issues:: Stage II,Other (Comment) Stage II: coccyx Other: puncture wound right buttocks  Last BM:  04/27/20 (75 ml output via colostomy)  Height:   Ht Readings from Last 1 Encounters:  03/31/20 5\' 6"  (1.676 m)    Weight:   Wt Readings from Last 1 Encounters:  04/25/20 58.4 kg   BMI:  Body mass index is 20.78 kg/m.  Estimated Nutritional Needs:   Kcal:  1900-2100  Protein:  120-135 grams  Fluid:  > 1.9 L    Loistine Chance, RD, LDN, [place] Registered Dietitian II Certified Diabetes Care and Education Specialist Please refer to [facility] for RD and/or RD on-call/weekend/after hours pager

## 2020-04-27 NOTE — Progress Notes (Signed)
Patient ID: Chase [name]., male   DOB: 1965-09-24, 55 y.o.   MRN: [id number]    This NP visited patient at the bedside as a follow up for palliative medicine needs and emotional support.  Patient is alert and oriented and in good spirits. He is improving physically and functionally.  He is working with therapies.     Plan was to dc to SNF but it looks like plan is seems now to  discharge home with Home health  Recommend OP community based Palliative.     Patient has not completed and AD documents, continue to encourage him to do so.   Education offered to  patient regarding the importance of continued conversation with family and their  medical providers regarding overall plan of care and treatment options,  ensuring decisions are within the context of the patients values and GOCs.  Questions and concerns addressed     PMT will sign off a this time, please re consult if we can be of assistance in the future.   Total time spent on the unit was 35 minutes  Greater than 50% of the time was spent in counseling and coordination of care  Wadie Lessen NP  Palliative Medicine Team Team Phone # [phone] Pager [phone]

## 2020-04-27 NOTE — Progress Notes (Signed)
CSW spoke with Whitney at [facility] - there are no beds available.  CSW spoke with Narda Rutherford at [facility] to further discuss this patient - she will review this patient and notify CSW of decision.  Madilyn Fireman, MSW, LCSW-A Transitions of Care  Clinical Social Worker I [phone]

## 2020-04-27 NOTE — Progress Notes (Signed)
[facility] PROGRESS NOTE  Chase [name] ZHY:[id number] DOB: 11/21/1965 DOA: 03/31/2020 PCP: [name]           Status: Remains inpatient appropriate because:Unsafe d/c plan and Inpatient level of care appropriate due to severity of illness   Dispo: The patient is from: Home              Anticipated d/c is to: SNF              Anticipated d/c date is: > 3 days              Patient currently is not medically stable to d/c. Barriers to discharge: Status post diverting loop colostomy as well as internal stent placement, PT recommending SNF at present we have not received any bed offers from [place] affiliated facilities.  As of 1/27 patient has been making significant improvement in overall physical mental and nutritional status.  At this juncture he prefers to discharge home with home health.  TOC aware and orders have been placed.  Plan is to continue current hospitalization and aggressive inpatient PT and will reevaluate on Monday, February 1 to determine if patient appropriate for discharge home either February 2 of February 3.  Patient states mom will be able to stay with him for several weeks after discharge but needs advance notice before discharge.   Code Status: Full code Family Communication: Patient daily; at length with son [name] on 1/19. DVT prophylaxis: SQ heparin Vaccination status: Did receive 1 dose of the Moderna COVID-vaccine.  Subsequently refusing any future doses or even a booster shot.  States he had COVID 3 months ago and has reduced immune system and does not feel these immunizations are effective for him.  Foley catheter: Presented with chronic Foley catheter, also has bilateral percutaneous nephrostomy tubes  HPI: HPI on 03/31/2020 by Dr. 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 Patient was reportedly seen by neighbors yesterday in a recliner and was then found on the floor today obtunded. He was sent to [facility] emergency department and is unable to provide history due to his clinical condition.  Interim history Admitted with acute renal failure on chronic kidney disease, stage V secondary to urinary obstruction and dehydration with electrolyte abnormalities.  Nephrology consulted.  Patient did undergo hemodialysis.  IR placed bilateral percutaneous nephrostomy tubes.  Creatinine did improve with a couple cycles of hemodialysis however plateaued.  Hemodialysis catheter was then removed.  No further plans for hemodialysis at this time.  Nephrology feels that patient can be discharged home with outpatient follow-up.  Since admission patient has undergone dual procedure that included diverting loop colostomy and replacement of internal ureteral stents.  Urine output and renal function has remained stable therefore urology has discontinued nephrostomy tubes.  Patient's affect and appetite have improved post diverting colostomy.  PT and OT continue to recommend SNF placement for severe deconditioning.   Subjective: Patient awake and alert.  Discussing discharge options and prefers to discharge to home with home health services.  No specific complaints  verbalized.  Reports pain is adequately controlled and has good appetite and is eating well.  Also states he has been able to get up without assistance to ambulate to the bathroom.  Objective: Vitals:   04/26/20 1936 04/27/20 0421  BP: 119/82 113/71   Pulse: 70 66  Resp: 17 20  Temp: 98.1 F (36.7 C) (!) 97.3 F (36.3 C)  SpO2: 99% 100%    Intake/Output Summary (Last 24 hours) at 04/27/2020 0841 Last data filed at 04/27/2020 0433 Gross per 24 hour  Intake 690.24 ml  Output 5200 ml  Net -4509.76 ml   Filed Weights   04/20/20 0500 04/24/20 0500 04/25/20 0526  Weight: 55.6 kg 59.2 kg 58.4 kg    Exam: General: Alert, calm, no acute distress Respiratory: Bilateral lung sounds clear to auscultation without increased work of breathing, stable on room air Cardiovascular: Normal heart sounds, color pink, extremities warm to touch with adequate capillary refill, no tachycardia. Abdomen: Colostomy stoma pink, tolerating meals 75% to 100% of meals per day, abdomen soft and nontender. LBM Genitourinary: Foley catheter draining yellow urine to bedside bag Neurologic: CN 2-12 grossly intact. Sensation intact, DTR normal. Strength 5/5 x all 4 extremities.  Psychiatric: Normal judgment and insight. Alert and oriented x 3.  Flat mood and affect.    Assessment/Plan: Acute problems: Acute renal failure on chronic kidney disease, stage V -Nephrology consulted and appreciated -Creatinine 5.76 in October 2021, although patient presented with a creatinine of 16.69 -Chronic kidney disease 2/2 longstanding obstruction.  Acute kidney injury due to dehydration and sepsis along with ATN with obstruction. -Interventional radiology consulted for bilateral percutaneous nephrostomy tube placement, 04/01/2020 as well as temporary HD catheter placement. -Patient did undergo hemodialysis, 2 cycles with last hemodialysis on 04/04/2019. -Creatinine has now plateaued, currently 5.08 -Though creatinine is still elevated, it is below his baseline.  Patient himself is unsure about wanting continuous HD.   -Nephrology recommended that patient follow-up with nephrology as an outpatient.   -Recommended continuing bicarb on discharge.  Recurrent orthostasis -Likely  from Flomax-has chronic Foley catheter therefore do not see any harm at this juncture w/ discontinuing Flomax -General surgery has ordered cosyntropin stimulation test which is normal -Continues to have episodes of orthostasis which are improving.  Hopeful in the outpatient setting can resume alpha blockers to treat PTSD and urinary symptoms  Bilateral hydronephrosis s/p cystoscopy,B ureteral stent placement w/ B retrograde pyelogram/bladder outlet obstruction -Per IR follow-up on 04/07/2019, patient had urinary obstruction secondary to pelvic mass with subsequent bilateral hydronephrosis in the setting of bilateral ureteral stents, bilateral PCNs inadvertently removed (unknown how long they have been out), status post bilateral PCN placement on 04/01/2020. -1/18 Dr Alinda Money performing concurrent procedure with surgical team (cystoscopy and bilateral ureteral stent change)  -Nephrostomy tubes removed under fluoroscopy on 1/25 -Presented w/ chronic Foley catheter as well. -Continue Foley until can tolerate alpha-blocker-need to delay consideration for discontinuation of Foley until outpatient setting  History of pelvic abscess/known colovesical fistula s/p lap sigmoid colectomy w/epigastric hernia repair (1/18) -Noted on 11/15/2019, status post left transgluteal drain by IR/colovesicular fistula -1/18 underwent diverting loop colostomy by [name] in conjunction with urological procedure by Dr. Alinda Money as described above -Primary GI is Dr. Rush Landmark.  -Participating with self care regarding colostomy-states assisted with changing of bag today  Goals of care -Palliative care consulted and appreciated -Patient full code and refused to discuss any decisions regarding plan of care -Patient should follow up with palliative care as an outpatient -  1/19 D/w with patient's son [name].  Recommendation is to have eventual discussion with patient and if necessary family regarding instituting DNR status.  It is  felt by the medical team that patient is so poorly conditioned that he would not tolerate a resuscitative event and likely would not survive.  Emphasis would be on DNR clarification and not comfort care since at this juncture patient appears to be improving slightly although long-term prognosis is not good.  1/20 also discussed with patient and he agrees DNR at this juncture would be warranted given his poor status noting he would not likely survive a resuscitation event.  He wishes to discuss with family first  Severe physical deconditioning, history of prior CVA -July 2021, history of atrial fibrillation, patient is not a candidate for anticoagulation due to GI bleed and thrombocytopenia -Currently no focal deficits  Severe protein calorie malnutrition Nutrition Problem: Severe Malnutrition Etiology: chronic illness (colovesical fistula) Signs/Symptoms: moderate fat depletion,severe fat depletion,moderate muscle depletion,severe muscle depletion Interventions: MVI,Refer to RD note for recommendations Estimated body mass index is 20.78 kg/m as calculated from the following:   Height as of this encounter: 5\' 6"  (1.676 m).   Weight as of this encounter: 58.4 kg. -Postoperatively patient is reporting increased appetite.  Continue to follow intake.  May require calorie count Chronic pain -Pt was taking a massive dose of Neurontin PTA (800 mg TID)-with his low GFR will give 200 mg bid 100 mg at HS  -Continue low-dose Zanaflex   Other problems: Severe dehydration with multiple metabolic derangements as follows: Severe metabolic acidosis, hyperkalemia, hyperphosphatemia -Resolved after rehydration, dialysis and Kayexalate  Severe sepsis secondary to urinary tract infection -Present on admission-sepsis physiology has now resolved -Patient presented with encephalopathy as well as hypothermia and leukocytosis -Blood cultures were negative x2 -Urine culture from 12/31 and 1/1 showed multiple  species -CT renal stone study 12/31: Bladder wall appeared slightly thickened.  No significant fluid collection in pelvis -Patient completed 7 days of IV cefepime  PTSD -1/24 will resume Vistaril 10 mg 3 times daily as needed and tizanidine 2 mg 3 times daily as needed for muscle spasms -Continue preadmission Cymbalta twice daily  History of gastric bypass  -Avoid NSAIDs (unable to use currently in context of chronic kidney disease)  Data Reviewed: Basic Metabolic Panel: Recent Labs  Lab 04/21/20 0628 04/22/20 0033 04/23/20 0144 04/24/20 0228 04/25/20 0308 04/26/20 0022  NA 138 138 139 135 138 137  K 3.8 3.8 4.4 3.9 3.6 3.5  CL 106 106 106 106 107 107  CO2 23 21* 24 21* 22 22  GLUCOSE 90 91 98 96 81 159*  BUN 23* 26* 29* 32* 34* 34*  CREATININE 4.31* 4.46* 4.57* 5.06* 4.70* 4.42*  CALCIUM 7.5* 7.6* 8.0* 7.5* 7.6* 7.5*  MG 1.8  --   --   --   --   --   PHOS 2.6  --   --   --   --   --    Liver Function Tests: No results for input(s): AST, ALT, ALKPHOS, BILITOT, PROT, ALBUMIN in the last 168 hours. No results for input(s): LIPASE, AMYLASE in the last 168 hours. No results for input(s): AMMONIA in the last 168 hours. CBC: Recent Labs  Lab 04/21/20 0628 04/23/20 0144 04/26/20 0022  WBC 6.6 6.6 5.1  HGB 7.5* 8.9* 8.1*  HCT 24.9* 29.5* 26.0*  MCV 98.8 98.7 97.0  PLT 287 312 284   Cardiac Enzymes: No results for input(s): CKTOTAL, CKMB, CKMBINDEX,  TROPONINI in the last 168 hours. BNP (last 3 results) No results for input(s): BNP in the last 8760 hours.  ProBNP (last 3 results) No results for input(s): PROBNP in the last 8760 hours.  CBG: Recent Labs  Lab 04/23/20 1750 04/23/20 2125 04/24/20 0741 04/24/20 1211 04/24/20 1640  GLUCAP 241* 141* 123* 99 103*    No results found for this or any previous visit (from the past 240 hour(s)).   Studies: IR Nephro Tube Remov/FL  Result Date: 04/25/2020 INDICATION: Bilateral nephrostomies, surgically placed  internal ureteral stents, assess for nephrostomy removals EXAM: BILATERAL NEPHROSTOMY REMOVAL UNDER FLUOROSCOPY MEDICATIONS: NONE. ANESTHESIA/SEDATION: Moderate Sedation Time:  None. The patient was continuously monitored during the procedure by the interventional radiology nurse under my direct supervision. CONTRAST:  20 cc-administered into the collecting system(s) FLUOROSCOPY TIME:  Fluoroscopy Time: 1 minutes 24 seconds (31 mGy). COMPLICATIONS: None immediate. PROCEDURE: Under sterile conditions, initially the left nephrostomy catheter was injected with contrast. This confirms antegrade flow and drainage in the left internal ureteral stent. Nephrostomy catheter was cut and removed over a guidewire under fluoroscopy without dislodging the stent. On the right side, the nephrostomy catheter has retracted into the percutaneous tract and is just beneath the skin in the subcutaneous tissues. Therefore the malposition catheter was cut and removed. FINDINGS: As above IMPRESSION: Successful removal of the bilateral nephrostomy catheters Electronically Signed   By: Jerilynn Mages.  Shick M.D.   On: 04/25/2020 14:47    Scheduled Meds: . (feeding supplement) PROSource Plus  30 mL Oral BID BM  . sodium chloride   Intravenous Once  . calcium carbonate  1 tablet Oral TID  . Chlorhexidine Gluconate Cloth  6 each Topical Daily  . cholecalciferol  1,000 Units Oral Daily  . DULoxetine  60 mg Oral Daily   And  . DULoxetine  30 mg Oral QHS  . feeding supplement  1 Container Oral TID BM  . gabapentin  200 mg Oral Daily  . gabapentin  300 mg Oral QHS  . heparin  5,000 Units Subcutaneous Q8H  . lip balm  1 application Topical BID  . multivitamin with minerals  1 tablet Oral BID  . pantoprazole  40 mg Oral QHS  . polycarbophil  625 mg Oral BID  . sodium bicarbonate  1,300 mg Oral TID  . sodium chloride flush  3 mL Intravenous Q12H  . sodium chloride flush  5 mL Intracatheter Q8H   Continuous Infusions: . sodium chloride 250  mL (04/22/20 1039)    Active Problems:   CKD (chronic kidney disease) stage 5, GFR less than 15 ml/min (HCC)   Chronic pain   Protein-calorie malnutrition, severe   Pelvic abscess with colovescial fistula   Acute kidney failure (HCC)   History of COVID-19   History of gastric bypass   Hodgkin lymphoma, unspecified, unspecified site (HCC)   Metabolic acidosis   Pernicious anemia   Toxic metabolic encephalopathy   PTSD (post-traumatic stress disorder)   Bilateral ureteral obstruction s/p perc nephrosotmy & stenting   Anxiety and depression   History of CVA (cerebrovascular accident)   BMI less than 19,adult   Chronic narcotic use   Anemia in chronic kidney disease   Incisional hernia - epigastric   Sepsis with acute renal failure without septic shock ([place])   DNR (do not resuscitate) discussion   Palliative care by specialist   Adult failure to thrive   Acute respiratory failure with hypoxia ([place])   Diverticulitis of sigmoid colon with abscess s/p  colectomy/colostomy Jeanette Caprice) 04/18/2020   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[facility])   Personality disorder    Cachexia [facility])   Physical deconditioning   Consultants:    Procedures:    Antibiotics: Anti-infectives (From admission, onward)   Start     Dose/Rate Route Frequency Ordered Stop   04/19/20 0200  cefoTEtan (CEFOTAN) 2 g in sodium chloride 0.9 % 100 mL IVPB        2 g 200 mL/hr over 30 Minutes Intravenous Every 12 hours 04/18/20 1824 04/19/20 0238   04/18/20 1200  cefoTEtan (CEFOTAN) 2 g in sodium chloride 0.9 % 100 mL IVPB        2 g 200 mL/hr over 30 Minutes Intravenous To [facility] 04/17/20 2206 04/18/20 1410   04/17/20 2215  neomycin (MYCIFRADIN) tablet 1,000 mg       "And" Linked Group Details   1,000 mg Oral  Once 04/17/20 2206 04/17/20 2348   04/17/20 2215  metroNIDAZOLE (FLAGYL) tablet 1,000 mg       "And" Linked Group Details   1,000 mg Oral  Once 04/17/20 2206 04/17/20 2336   04/13/20 1145  cefoTEtan  (CEFOTAN) 2 g in sodium chloride 0.9 % 100 mL IVPB        2 g 200 mL/hr over 30 Minutes Intravenous On call to O.R. 04/12/20 2006 04/14/20 0559   04/12/20 2100  neomycin (MYCIFRADIN) tablet 1,000 mg       "And" Linked Group Details   1,000 mg Oral 3 times per day 04/12/20 2006 04/13/20 0527   04/12/20 2100  metroNIDAZOLE (FLAGYL) tablet 1,000 mg       "And" Linked Group Details   1,000 mg Oral 3 times per day 04/12/20 2006 04/13/20 0527   04/03/20 1600  ceFEPIme (MAXIPIME) 1 g in sodium chloride 0.9 % 100 mL IVPB        1 g 200 mL/hr over 30 Minutes Intravenous Every 24 hours 04/03/20 0918 04/07/20 1108   04/01/20 1600  ceFEPIme (MAXIPIME) 1 g in sodium chloride 0.9 % 100 mL IVPB  Status:  Discontinued        1 g 200 mL/hr over 30 Minutes Intravenous Every 24 hours 03/31/20 1550 04/03/20 0918   04/01/20 1145  cefTRIAXone (ROCEPHIN) 1 g in sodium chloride 0.9 % 100 mL IVPB        1 g 200 mL/hr over 30 Minutes Intravenous  Once 04/01/20 1057 04/01/20 1308   03/31/20 1415  ceFEPIme (MAXIPIME) 2 g in sodium chloride 0.9 % 100 mL IVPB        2 g 200 mL/hr over 30 Minutes Intravenous  Once 03/31/20 1407 03/31/20 1516       Time spent:  20 minutes    Erin Hearing ANP  [facility] 7 am - 330 pm/M-F for direct patient care and secure chat Please refer to Amion for contact info 27  days

## 2020-04-27 NOTE — Consult Note (Signed)
[place] Nurse ostomy follow up Patient receiving care in [facility] [phone] Stoma type/location: LLQ colostomy Stomal assessment/size: Round, budded, pink and moist. 1 3/4"  Peristomal assessment: intact with minor bleeding at the mucocutaneous junction.  Treatment options for stomal/peristomal skin: ordered some barrier rings Kellie Simmering # [id number]) Output: thin yellow/green  Ostomy pouching: 2pc. 2 3/4" pouch Kellie Simmering # [id number]). Skin barrier Kellie Simmering #2) Education provided: Patient was able to participate in most of his pouch change today. He opened and closed a new pouch. Removed the old pouch and skin barrier. Assisted with cleaning around the stoma. Explained measuring the stoma once a week to assess for change in size. Patient cut out to size the skin barrier. Removed backing and placed the skin barrier around the stoma. Reviewed the need for barrier ring for skin protection or crusting if the skin gets irritated. Ordered a bottle of stoma powder Kellie Simmering # 6). Patient placed the pouch over the skin barrier and snapped in place. Great participation today. States he needs to learn how to do this if he goes home with his mom because she will not do it. Reviewed emptying and releasing gas from the pouch.  Enrolled patient in [place] Start Discharge program: No  Cathlean [place]se[place]. Tamala Julian, MSN, RN, [place], Lysle Pearl, [facility] Wound Treatment Associate Pager [phone]

## 2020-04-27 NOTE — TOC Progression Note (Signed)
Transition of Care (TOC) - Progression Note    Patient Details  Name: CORDARIUS BENNING Sr. MRN: [id number] Date of Birth: 09/01/1965  Transition of [facility]) CM/SW Contact  Curlene Labrum, RN Phone Number: 04/27/2020, 11:40 AM  Clinical Narrative:    Case management met with the patient at the bedside regarding transitions of care.  [facility] and [facility] did not have an available bed for this patient at this time - patient was made aware.  Pam, CM at [facility] states that they are unable to accept the patient for rehab as well considering the patient's PT notes suggest SNF placement.    The patient was alert and oriented and states that he has been working with PT/OT and nursing and feels that he is getting stronger and would like to discharge to home the first of next week.  The patient stated that his mother, Meredith Mody, lives in [place], [place] and plans on coming to live with the patient for the next 3 weeks to assist in his care at the home.  The patient states that he is able and willing to care for his colostomy bag at home.  I will notify [place] nurse that the patient is requesting to go home with home health services instead of SNF placement.  The patient was given choice regarding home health services and states that he was pleased with his services with [facility] in the past and would like to continue services with them - pt previously approved for [facility] for RN, PT, aide through [place] thru May 22.  I called and spoke with [place], [place] at [place] at [facility] and explained that patient no longer has nephrostomy bags but will be discharged home with colostomy - she is checking to see if director will accept continued care of the patient through [facility] - will follow up.  I called and left a message with April at [facility] stating that the patient plans to discharge home with home health services at this time instead of SNF - He will need [facility] RN, PT, and aide.   [facility] patient and needed orders to continue home health.    The patient currently has RW, WC, ramp at home, and 3:1 at the home.  CM and MSW will continue to follow the patient for discharge to home possibly as early as next week Tuesday or Wednesday - patient to call his mother to make sure she will be available to provide 24 hour supervision at home at this time.   Expected Discharge Plan: Skilled Nursing Facility Barriers to Discharge: No SNF bed  Expected Discharge Plan and Services Expected Discharge Plan: [place] arrangements for the past 2 months: Apartment                                       Social Determinants of Health (SDOH) Interventions    Readmission Risk Interventions Readmission Risk Prevention Plan 12/25/2019 11/19/2019  Transportation Screening Complete Complete  Medication Review Press photographer) Complete Complete  PCP or Specialist appointment within 3-5 days of discharge - Complete  HRI or [place] - Complete  SW Recovery Care/Counseling Consult - Complete  Palliative Care Screening - Not Applicable  Skilled  Nursing Facility - Complete  Some recent data might be hidden

## 2020-04-28 DIAGNOSIS — A419 Sepsis, unspecified organism: Secondary | ICD-10-CM | POA: Diagnosis not present

## 2020-04-28 DIAGNOSIS — Z933 Colostomy status: Secondary | ICD-10-CM | POA: Diagnosis not present

## 2020-04-28 DIAGNOSIS — N185 Chronic kidney disease, stage 5: Secondary | ICD-10-CM | POA: Diagnosis not present

## 2020-04-28 DIAGNOSIS — N321 Vesicointestinal fistula: Secondary | ICD-10-CM | POA: Diagnosis not present

## 2020-04-28 LAB — PREALBUMIN: Prealbumin: 21 mg/dL (ref 18–38)

## 2020-04-28 NOTE — Progress Notes (Signed)
Occupational Therapy Treatment Patient Details Name: Chase [name] Sr. 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 He was admitted for acute renal failure complicating stage V CKD secondary to urinary obstruction and dehydration with severe electrolyte derangements.  Urology was consulted.  Patient underwent hemodialysis. s/p ureteral stent placement, retrograde pyelogram, lap sigmoid colectomy with colostomy, and epigastric hernia repair on 1/18. B PCN tubes removed 1/25.   OT comments  Educated pt in energy conservation strategies during ADL and IADL at home. Pt able to get OOB without assist, to chair with min guard assist, don hoodie and socks with set up and remained up in chair for breakfast at end of session. Reports he has all necessary DME for d/c home with his mother's assist. Pt reports he will be using foley at home. Encouraged pt to continue bathing and dressing himself with nursing seated on 3 in 1 at sink throughout the weekend. Updated d/c.  Follow Up Recommendations  Home health OT    Equipment Recommendations  None recommended by OT    Recommendations for Other Services      Precautions / Restrictions Precautions Precautions: Fall Precaution Comments: colostomy; orthostatic hypotension/TED hose       Mobility Bed Mobility Overal bed mobility: Modified Independent                Transfers Overall transfer level: Needs assistance Equipment used: Rolling walker (2 wheeled) Transfers: Sit to/from Stand Sit to Stand: Min guard              Balance Overall balance  assessment: Needs assistance Sitting-balance support: No upper extremity supported Sitting balance-Leahy Scale: Good     Standing balance support: Bilateral upper extremity supported Standing balance-Leahy Scale: Poor Standing balance comment: reliant on RW, min guard assist                           ADL either performed or assessed with clinical judgement   ADL Overall ADL's : Needs assistance/impaired Eating/Feeding: Independent;Sitting   Grooming: Wash/dry hands;Sitting;Set up Grooming Details (indicate cue type and reason): in chair         Upper Body Dressing : Set up;Sitting Upper Body Dressing Details (indicate cue type and reason): dons hoodie independently Lower Body Dressing: Set up Lower Body Dressing Details (indicate cue type and reason): socks             Functional mobility during ADLs: Min guard;Rolling walker       Vision       Perception     Praxis      Cognition Arousal/Alertness: Awake/alert Behavior During Therapy: WFL for tasks assessed/performed Overall Cognitive Status: Within Functional Limits for tasks assessed                                 General Comments: repetitive, has thought through reasons to go home vs to SNF and has chosen home with his mom's assist        Exercises     Shoulder Instructions       General Comments  Pertinent Vitals/ Pain       Pain Assessment: No/denies pain  Home Living                                          Prior Functioning/Environment              Frequency  Min 2X/week        Progress Toward Goals  OT Goals(current goals can now be found in the care plan section)  Progress towards OT goals: Progressing toward goals  Acute Rehab OT Goals Patient Stated Goal: go home OT Goal Formulation: With patient Time For Goal Achievement: 04/20/20 Potential to Achieve Goals: Good  Plan Discharge plan remains appropriate     Co-evaluation                 AM-PAC OT "6 Clicks" Daily Activity     Outcome Measure   Help from another person eating meals?: None Help from another person taking care of personal grooming?: A Little Help from another person toileting, which includes using toliet, bedpan, or urinal?: A Little Help from another person bathing (including washing, rinsing, drying)?: A Little Help from another person to put on and taking off regular upper body clothing?: None Help from another person to put on and taking off regular lower body clothing?: None 6 Click Score: 21    End of Session Equipment Utilized During Treatment: Gait belt;Rolling walker  OT Visit Diagnosis: Unsteadiness on feet (R26.81);Other abnormalities of gait and mobility (R26.89);Muscle weakness (generalized) (M62.81);Adult, failure to thrive (R62.7)   Activity Tolerance Patient tolerated treatment well   Patient Left in chair;with call bell/phone within reach;with chair alarm set   Nurse Communication          Time: [id number] OT Time Calculation (min): 22 min  Charges: OT General Charges $OT Visit: 1 Visit OT Treatments $Self Care/Home Management : 8-22 mins  Nestor Lewandowsky, OTR/L Acute Rehabilitation Services Pager: [phone] Office: [phone]   Malka So 04/28/2020, 8:59 AM

## 2020-04-28 NOTE — TOC Progression Note (Signed)
Transition of Care (TOC) - Progression Note    Patient Details  Name: LEODIS ALCOCER Sr. MRN: [id number] Date of Birth: [birth year]  Transition of Care [facility]) CM/SW [place], RN Phone Number: 04/28/2020, 10:41 AM  Clinical Narrative:    Case management called Cyndee Brightly, MSW at [facility] in [place] explained that the patient will need ostomy supplies ordered and supplied to the patient's home.  I faxed the facesheet, request for ostomy supplies and foley bag to be shipped to the home - included in the fax was a note of needed supplies, home health company - [facility], and wound care nurse note that includes ostomy sizes/supplies from [facility] note from 04/20/2020.  I faxed the supply needs letter to Hoy Finlay, NP / attention Phoebe Perch, RN at [facility] PCP office - to fax [phone], office # [phone], ext [phone].  CM and MSW will continue to follow the patient for discharge to home with home health next week.  The patient's mother plans to arrive next week  To provide 24 hour care assistance.   Expected Discharge Plan: Skilled Nursing Facility Barriers to Discharge: No SNF bed  Expected Discharge Plan and Services Expected Discharge Plan: [place] arrangements for the past 2 months: Apartment                                       Social Determinants of Health (SDOH) Interventions    Readmission Risk Interventions Readmission Risk Prevention Plan 12/25/2019 11/19/2019  Transportation Screening Complete Complete  Medication Review Press photographer) Complete Complete  PCP or Specialist appointment within 3-5 days of discharge - Complete  HRI or [place] - Complete  SW Recovery Care/Counseling Consult - Complete  [place] - Complete  Some recent data might be hidden

## 2020-04-28 NOTE — Progress Notes (Signed)
Physical Therapy Treatment Patient Details Name: Chase LUCKADOO Sr. 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 He was admitted for acute renal failure complicating stage V CKD secondary to urinary obstruction and dehydration with severe electrolyte derangements.  Urology was consulted.  Patient underwent hemodialysis. s/p ureteral stent placement, retrograde pyelogram, lap sigmoid colectomy with colostomy, and epigastric hernia repair on 1/18. B PCN tubes removed 1/25.    PT Comments    Pt received in supine, agreeable to therapy session and with excellent participation and good tolerance for mobility. Pt performed transfers with varying min guard to minA and pre-gait standing tasks with minA and RW. Pt with good tolerance for seated/standing exercises this date and able to stand 3-5 minutes at a time without dizziness or feeling weak. Pt continues to benefit from PT services to progress toward functional mobility goals. DC recs below remain appropriate, will plan to progress ambulation next date with chair follow.   Follow Up Recommendations  SNF     Equipment Recommendations  Rolling walker with 5" wheels;3in1 (PT)    Recommendations for Other Services       Precautions / Restrictions Precautions Precautions: Fall Precaution Comments: colostomy; orthostatic hypotension/TED hose Restrictions Weight Bearing Restrictions: No    Mobility  Bed Mobility Overal bed mobility: Modified Independent             General bed mobility comments: use of bed features  Transfers Overall transfer level:  Needs assistance Equipment used: Rolling walker (2 wheeled) Transfers: Sit to/from Stand Sit to Stand: Min guard;Min assist         General transfer comment: from EOB to RW, needs min guard when pushing with BUE from bed surface but minA when pushing with only one arm and other hand on knee x15 total reps  Ambulation/Gait             General Gait Details: pre-gait hip flexion x20 reps, stepping at bedside 57ft forward/retro but deferred longer trial due to fatigue after standing exercises/transfer training   Stairs             Wheelchair Mobility    Modified Rankin (Stroke Patients Only)       Balance Overall balance assessment: Needs assistance Sitting-balance support: No upper extremity supported Sitting balance-Leahy Scale: Good     Standing balance support: Bilateral upper extremity supported Standing balance-Leahy Scale: Poor Standing balance comment: reliant on RW, min guard assist for static standing mild posterior LOB during standing exercise corrected with minA                            Cognition Arousal/Alertness: Awake/alert Behavior During Therapy: WFL for tasks assessed/performed Overall Cognitive Status: Within Functional Limits for tasks assessed                                 General Comments: cooperative; pt has thought through reasons to go home vs to SNF and has chosen home with his mom's assist next week      Exercises Other Exercises Other Exercises: arm pumps  x 15 prior to standing Other Exercises: STS x 15 reps (including 2 seated breaks) Other Exercises: standing BLE AROM: hip flexion 2x10 reps, hamstring curls, heel raises, mini squats, hip abduction 1x10 reps ea    General Comments General comments (skin integrity, edema, etc.): pt reporting back feels itchy, given gentle scrub with wash cloth, small cuts noted to skin on back and pt instructed not to itch with fingernails      Pertinent Vitals/Pain  Pain Assessment: No/denies pain Pain Intervention(s): Monitored during session    Home Living                      Prior Function            PT Goals (current goals can now be found in the care plan section) Acute Rehab PT Goals Patient Stated Goal: go home PT Goal Formulation: With patient Time For Goal Achievement: 05/05/20 Potential to Achieve Goals: Fair Progress towards PT goals: Progressing toward goals    Frequency    Min 3X/week      PT Plan Current plan remains appropriate    Co-evaluation              AM-PAC PT "6 Clicks" Mobility   Outcome Measure  Help needed turning from your back to your side while in a flat bed without using bedrails?: A Little Help needed moving from lying on your back to sitting on the side of a flat bed without using bedrails?: A Little Help needed moving to and from a bed to a chair (including a wheelchair)?: A Little Help needed standing up from a chair using your arms (e.g., wheelchair or bedside chair)?: A Little Help needed to walk in hospital room?: A Little Help needed climbing 3-5 steps with a railing? : A Lot 6 Click Score: 17    End of Session Equipment Utilized During Treatment: Gait belt Activity Tolerance: Patient tolerated treatment well Patient left: in bed;with call bell/phone within reach Nurse Communication: Mobility status PT Visit Diagnosis: Unsteadiness on feet (R26.81);Difficulty in walking, not elsewhere classified (R26.2);Muscle weakness (generalized) (M62.81)     Time: 1610-9604 PT Time Calculation (min) (ACUTE ONLY): 30 min  Charges:  $Therapeutic Exercise: 23-37 mins                     Tait Balistreri P., PTA Acute Rehabilitation Services Pager: [phone] Office: [place] 04/28/2020, 5:26 PM

## 2020-04-28 NOTE — Progress Notes (Signed)
[facility] PROGRESS NOTE  Chase [name] JIR:[id number] DOB: Aug 21, 1965 DOA: 03/31/2020 PCP: [name]        04/14/20                            04/27/20                        04/28/20     Status: Remains inpatient appropriate because:Unsafe d/c plan and Inpatient level of care appropriate due to severity of illness   Dispo: The patient is from: Home              Anticipated d/c is to: SNF              Anticipated d/c date is: > 3 days              Patient currently is not medically stable to d/c. Barriers to discharge: Status post diverting loop colostomy as well as internal stent placement, PT recommending SNF at present we have not received any bed offers from [place] affiliated facilities.  As of 1/27 patient has been making significant improvement in overall physical mental and nutritional status.  At this juncture he prefers to discharge home with home health.  TOC aware and orders have been placed.  Plan is to continue current hospitalization and aggressive inpatient PT and will reevaluate on Monday, February 1 to determine if patient appropriate for discharge home either February 2 of February 3.  Patient states mom will be able to stay with him for several weeks after discharge but needs advance notice before discharge.   Code Status: Full code Family Communication: Patient daily; at length with son Chase [name] on 1/19. DVT prophylaxis: SQ heparin Vaccination status: Did receive 1 dose of the Moderna COVID-vaccine.  Subsequently refusing any future doses or even a booster shot.  States he had COVID 3 months ago and has reduced immune system and does not feel these immunizations are effective for him.  Foley catheter: Presented with chronic Foley catheter  HPI: HPI on 03/31/2020 by Dr. 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 Patient was reportedly seen by neighbors yesterday in a recliner and was then found on the floor today obtunded. He was sent to [facility] emergency department and is unable to provide history due to his clinical condition.  Interim history Admitted with acute renal failure on chronic kidney disease, stage V secondary to urinary obstruction and dehydration with electrolyte abnormalities.  Nephrology consulted.  Patient did undergo hemodialysis.  IR placed bilateral percutaneous nephrostomy tubes.  Creatinine did improve with a couple cycles of hemodialysis however plateaued.  Hemodialysis catheter was then removed.  No further plans for hemodialysis at this time.  Nephrology feels that patient can be discharged home with outpatient follow-up.  Since admission patient has undergone dual procedure that included diverting loop colostomy and replacement of internal ureteral stents.  Urine output and renal function has remained stable therefore urology has discontinued nephrostomy  tubes.  Patient's affect and appetite have improved post diverting colostomy.  PT and OT continue to recommend SNF placement for severe deconditioning.   Subjective: Awake and alert, sitting up in chair with no complaints.  Discussed tentative discharge timing and plans.  Patient appreciative that we will attempt to procure as many medical supplies as possible before discharge.  Objective: Vitals:   04/27/20 2030 04/28/20 0552  BP: 121/81 125/87  Pulse: 78 60  Resp: 17 16  Temp: 98.5 F (36.9 C) 97.7 F (36.5 C)   SpO2: 99% 100%    Intake/Output Summary (Last 24 hours) at 04/28/2020 0827 Last data filed at 04/28/2020 0457 Gross per 24 hour  Intake 1200 ml  Output 3525 ml  Net -2325 ml   Filed Weights   04/20/20 0500 04/24/20 0500 04/25/20 0526  Weight: 55.6 kg 59.2 kg 58.4 kg    Exam: General: Alert, no acute distress, appears much less cachectic than previous Respiratory: Lungs are clear to auscultation bilaterally, stable on room air Cardiovascular: Heart sounds S1-S2, extremities warm to touch with adequate capillary refill, no tachycardia Abdomen: Colostomy stoma pink with stool noted.  Some incisional tenderness but otherwise abdominal exam benign with normoactive bowel sounds.  Tolerating oral diet and has been consuming 100% of meals.  LBM 1/27 Genitourinary: Foley catheter  Neurologic: CN 2-12 grossly intact. Sensation intact, DTR normal. Strength 5/5 x all 4 extremities.  Psychiatric: Normal judgment and insight. Alert and oriented x 3.  Flat mood and affect.    Assessment/Plan: Acute problems: Acute renal failure on chronic kidney disease, stage V -Nephrology consulted and appreciated -Creatinine 5.76 in October 2021, although patient presented with a creatinine of 16.69 -Chronic kidney disease 2/2 longstanding obstruction.  Acute kidney injury due to dehydration and sepsis along with ATN with obstruction. -Interventional radiology consulted for bilateral percutaneous nephrostomy tube placement, 04/01/2020 as well as temporary HD catheter placement. -Patient did undergo hemodialysis, 2 cycles with last hemodialysis on 04/04/2019. -Creatinine has now plateaued, currently 5.08 -Though creatinine is still elevated, it is below his baseline.  Patient himself is unsure about wanting continuous HD.   -Nephrology recommended that patient follow-up with nephrology as an outpatient.   -Recommended continuing bicarb on discharge.  Recurrent orthostasis -Likely from Flomax-has chronic Foley  catheter therefore do not see any harm at this juncture w/ discontinuing Flomax -General surgery has ordered cosyntropin stimulation test which is normal -Continues to have episodes of orthostasis which are improving.  Hopeful in the outpatient setting can resume alpha blockers to treat PTSD and urinary symptoms  Bilateral hydronephrosis s/p cystoscopy,B ureteral stent placement w/ B retrograde pyelogram/bladder outlet obstruction -Per IR follow-up on 04/07/2019, patient had urinary obstruction secondary to pelvic mass with subsequent bilateral hydronephrosis in the setting of bilateral ureteral stents, bilateral PCNs inadvertently removed (unknown how long they have been out), status post bilateral PCN placement on 04/01/2020. -1/18 Dr Alinda Money performed concurrent procedure with surgical team (cystoscopy and bilateral ureteral stent change)  -Nephrostomy tubes removed under fluoroscopy on 1/25 -Presented w/ chronic Foley catheter as well. -Continue Foley until can tolerate alpha-blocker-need to delay consideration for discontinuation of Foley until outpatient setting  History of pelvic abscess/known colovesical fistula s/p lap sigmoid colectomy w/epigastric hernia repair (1/18) -Noted on 11/15/2019, status post left transgluteal drain by IR/colovesicular fistula -1/18 underwent diverting loop colostomy by [name] in conjunction with urological procedure by Dr. Alinda Money as described above -Primary GI is Dr. Rush Landmark.  -Participating with self care regarding colostomy-states assisted with  changing of bag today  Goals of care -Palliative care consulted and appreciated -Patient full code and refused to discuss any decisions regarding plan of care -Patient should follow up with palliative care as an outpatient -1/19 D/w with patient's son Chase [name].  Recommendation is to have eventual discussion with patient and if necessary family regarding instituting DNR status.  It is felt by the medical team that  patient is so poorly conditioned that he would not tolerate a resuscitative event and likely would not survive.  Emphasis would be on DNR clarification and not comfort care since at this juncture patient appears to be improving slightly although long-term prognosis is not good.  1/20 also discussed with patient and he agrees DNR at this juncture would be warranted given his poor status noting he would not likely survive a resuscitation event.  He wishes to discuss with family first  Severe physical deconditioning, history of prior CVA -July 2021, history of atrial fibrillation, patient is not a candidate for anticoagulation due to GI bleed and thrombocytopenia -Currently no focal deficits  Severe protein calorie malnutrition Nutrition Problem: Severe Malnutrition Etiology: chronic illness (colovesical fistula) Signs/Symptoms: moderate fat depletion,severe fat depletion,moderate muscle depletion,severe muscle depletion Interventions: MVI,Refer to RD note for recommendations Estimated body mass index is 20.78 kg/m as calculated from the following:   Height as of this encounter: 5\' 6"  (1.676 m).   Weight as of this encounter: 58.4 kg. -Postoperatively patient is reporting increased appetite.  Continue to follow intake.  May require calorie count  Chronic pain -Pt was taking a massive dose of Neurontin PTA (800 mg TID)-with his low GFR will give 200 mg bid 100 mg at HS  -Continue low-dose Zanaflex   Other problems: Severe dehydration with multiple metabolic derangements as follows: Severe metabolic acidosis, hyperkalemia, hyperphosphatemia -Resolved after rehydration, dialysis and Kayexalate  Severe sepsis secondary to urinary tract infection -Present on admission-sepsis physiology has now resolved -Patient presented with encephalopathy as well as hypothermia and leukocytosis -Blood cultures were negative x2 -Urine culture from 12/31 and 1/1 showed multiple species -CT renal stone study  12/31: Bladder wall appeared slightly thickened.  No significant fluid collection in pelvis -Patient completed 7 days of IV cefepime  PTSD -1/24 will resume Vistaril 10 mg 3 times daily as needed and tizanidine 2 mg 3 times daily as needed for muscle spasms -Continue preadmission Cymbalta twice daily  History of gastric bypass  -Avoid NSAIDs (unable to use currently in context of chronic kidney disease)  Data Reviewed: Basic Metabolic Panel: Recent Labs  Lab 04/22/20 0033 04/23/20 0144 04/24/20 0228 04/25/20 0308 04/26/20 0022  NA 138 139 135 138 137  K 3.8 4.4 3.9 3.6 3.5  CL 106 106 106 107 107  CO2 21* 24 21* 22 22  GLUCOSE 91 98 96 81 159*  BUN 26* 29* 32* 34* 34*  CREATININE 4.46* 4.57* 5.06* 4.70* 4.42*  CALCIUM 7.6* 8.0* 7.5* 7.6* 7.5*   Liver Function Tests: No results for input(s): AST, ALT, ALKPHOS, BILITOT, PROT, ALBUMIN in the last 168 hours. No results for input(s): LIPASE, AMYLASE in the last 168 hours. No results for input(s): AMMONIA in the last 168 hours. CBC: Recent Labs  Lab 04/23/20 0144 04/26/20 0022  WBC 6.6 5.1  HGB 8.9* 8.1*  HCT 29.5* 26.0*  MCV 98.7 97.0  PLT 312 284   Cardiac Enzymes: No results for input(s): CKTOTAL, CKMB, CKMBINDEX, TROPONINI in the last 168 hours. BNP (last 3 results) No results for input(s): BNP in  the last 8760 hours.  ProBNP (last 3 results) No results for input(s): PROBNP in the last 8760 hours.  CBG: Recent Labs  Lab 04/23/20 1750 04/23/20 2125 04/24/20 0741 04/24/20 1211 04/24/20 1640  GLUCAP 241* 141* 123* 99 103*    No results found for this or any previous visit (from the past 240 hour(s)).   Studies: No results found.  Scheduled Meds: . sodium chloride   Intravenous Once  . calcium carbonate  1 tablet Oral TID  . Chlorhexidine Gluconate Cloth  6 each Topical Daily  . cholecalciferol  1,000 Units Oral Daily  . DULoxetine  60 mg Oral Daily   And  . DULoxetine  30 mg Oral QHS  .  gabapentin  200 mg Oral Daily  . gabapentin  300 mg Oral QHS  . heparin  5,000 Units Subcutaneous Q8H  . lip balm  1 application Topical BID  . multivitamin with minerals  1 tablet Oral BID  . pantoprazole  40 mg Oral QHS  . polycarbophil  625 mg Oral BID  . sodium bicarbonate  1,300 mg Oral TID  . sodium chloride flush  3 mL Intravenous Q12H   Continuous Infusions: . sodium chloride 250 mL (04/22/20 1039)    Active Problems:   CKD (chronic kidney disease) stage 5, GFR less than 15 ml/min (HCC)   Chronic pain   Protein-calorie malnutrition, severe   Pelvic abscess with colovescial fistula   Acute kidney failure (HCC)   History of COVID-19   History of gastric bypass   Hodgkin lymphoma, unspecified, unspecified site (HCC)   Metabolic acidosis   Pernicious anemia   Toxic metabolic encephalopathy   PTSD (post-traumatic stress disorder)   Bilateral ureteral obstruction s/p perc nephrosotmy & stenting   Anxiety and depression   History of CVA (cerebrovascular accident)   BMI less than 19,adult   Chronic narcotic use   Anemia in chronic kidney disease   Incisional hernia - epigastric   Sepsis with acute renal failure without septic shock ([place])   DNR (do not resuscitate) discussion   Palliative care by specialist   Adult failure to thrive   Acute respiratory failure with hypoxia ([place])   Diverticulitis of sigmoid colon with abscess s/p colectomy/colostomy [facility]ette Caprice) 04/18/2020   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[facility])   Personality disorder    Cachexia ([place])   Physical deconditioning   Consultants:    Procedures:    Antibiotics: Anti-infectives (From admission, onward)   Start     Dose/Rate Route Frequency Ordered Stop   04/19/20 0200  cefoTEtan (CEFOTAN) 2 g in sodium chloride 0.9 % 100 mL IVPB        2 g 200 mL/hr over 30 Minutes Intravenous Every 12 hours 04/18/20 1824 04/19/20 0238   04/18/20 1200  cefoTEtan (CEFOTAN) 2 g in sodium chloride 0.9 % 100 mL IVPB        2 g 200  mL/hr over 30 Minutes Intravenous To [facility]Stay [facility] 04/17/20 2206 04/18/20 1410   04/17/20 2215  neomycin (MYCIFRADIN) tablet 1,000 mg       "And" Linked Group Details   1,000 mg Oral  Once 04/17/20 2206 04/17/20 2348   04/17/20 2215  metroNIDAZOLE (FLAGYL) tablet 1,000 mg       "And" Linked Group Details   1,000 mg Oral  Once 04/17/20 2206 04/17/20 2336   04/13/20 1145  cefoTEtan (CEFOTAN) 2 g in sodium chloride 0.9 % 100 mL IVPB        2 g 200  mL/hr over 30 Minutes Intravenous On call to O.R. 04/12/20 2006 04/14/20 0559   04/12/20 2100  neomycin (MYCIFRADIN) tablet 1,000 mg       "And" Linked Group Details   1,000 mg Oral 3 times per day 04/12/20 2006 04/13/20 0527   04/12/20 2100  metroNIDAZOLE (FLAGYL) tablet 1,000 mg       "And" Linked Group Details   1,000 mg Oral 3 times per day 04/12/20 2006 04/13/20 0527   04/03/20 1600  ceFEPIme (MAXIPIME) 1 g in sodium chloride 0.9 % 100 mL IVPB        1 g 200 mL/hr over 30 Minutes Intravenous Every 24 hours 04/03/20 0918 04/07/20 1108   04/01/20 1600  ceFEPIme (MAXIPIME) 1 g in sodium chloride 0.9 % 100 mL IVPB  Status:  Discontinued        1 g 200 mL/hr over 30 Minutes Intravenous Every 24 hours 03/31/20 1550 04/03/20 0918   04/01/20 1145  cefTRIAXone (ROCEPHIN) 1 g in sodium chloride 0.9 % 100 mL IVPB        1 g 200 mL/hr over 30 Minutes Intravenous  Once 04/01/20 1057 04/01/20 1308   03/31/20 1415  ceFEPIme (MAXIPIME) 2 g in sodium chloride 0.9 % 100 mL IVPB        2 g 200 mL/hr over 30 Minutes Intravenous  Once 03/31/20 1407 03/31/20 1516       Time spent:  20 minutes    Erin Hearing ANP  [facility] 7 am - 330 pm/M-F for direct patient care and secure chat Please refer to Amion for contact info 28  days

## 2020-04-29 DIAGNOSIS — N179 Acute kidney failure, unspecified: Secondary | ICD-10-CM | POA: Diagnosis not present

## 2020-04-29 NOTE — Progress Notes (Addendum)
[facility] PROGRESS NOTE  Hy Swiatek WER:[id number] DOB: 09/06/1965 DOA: 03/31/2020 PCP: [name]    Status: Remains inpatient appropriate because:Unsafe d/c plan and Inpatient level of care appropriate due to severity of illness   Dispo: The patient is from: Home              Anticipated d/c is to: SNF              Anticipated d/c date is: > 3 days              Patient currently is not medically stable to d/c. Barriers to discharge: Status post diverting loop colostomy as well as internal stent placement, PT recommending SNF at present we have not received any bed offers from [place] affiliated facilities.  As of 1/27 patient has been making significant improvement in overall physical mental and nutritional status.  At this juncture he prefers to discharge home with home health.  TOC aware and orders have been placed.  Plan is to continue current hospitalization and aggressive inpatient PT and will reevaluate on Monday, February 1 to determine if patient appropriate for discharge home either February 2 of February 3.  Patient states mom will be able to stay with him for several weeks after discharge but needs advance notice before discharge.   Code Status: Full code Family Communication: Patient daily; at length with son [name] on 1/19. DVT prophylaxis: SQ heparin Vaccination status: Did receive 1 dose of the Moderna COVID-vaccine.  Subsequently refusing any future doses or even a booster shot.  States he had COVID 3 months ago and has reduced immune system and does not feel these immunizations are effective for him.  Foley catheter: Presented with chronic Foley catheter  HPI: HPI on 03/31/2020 by Dr. 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 Patient was reportedly seen by neighbors yesterday in a recliner and was then found on the floor today obtunded. He was sent to [facility] emergency department and is unable to provide history due to his clinical condition.  Interim history Admitted with acute renal failure on chronic kidney disease, stage V secondary to urinary obstruction and dehydration with electrolyte abnormalities.  Nephrology consulted.  Patient did undergo hemodialysis.  IR placed bilateral percutaneous nephrostomy tubes.  Creatinine did improve with a couple cycles of hemodialysis however plateaued.  Hemodialysis catheter was then removed.  No further plans for hemodialysis at this time.  Nephrology feels that patient can be discharged home with outpatient follow-up.  Since admission patient has undergone dual procedure that included diverting loop colostomy and replacement of internal ureteral stents.  Urine output and renal function has remained stable therefore urology has discontinued nephrostomy tubes.  Patient's affect and appetite have improved post diverting colostomy.  PT and OT continue to recommend SNF placement for severe deconditioning.   Subjective: Awake and alert, sitting up in chair with no complaints.  Discussed tentative discharge timing and plans.  Patient appreciative that we will attempt to procure as many medical supplies as possible before discharge.  Objective: Vitals:   04/28/20 2038 04/29/20 0555  BP: 129/84 (!) 154/65  Pulse: 85 (!) 58  Resp: 17 16  Temp: 98.6 F (37 C) 98.2 F (36.8 C)  SpO2: 100% 100%    Intake/Output Summary (Last 24 hours) at 04/29/2020 1105 Last  data filed at 04/29/2020 6433 Gross per 24 hour  Intake 2053 ml  Output 2350 ml  Net -297 ml   Filed Weights   04/20/20 0500 04/24/20 0500 04/25/20 0526  Weight: 55.6 kg 59.2 kg 58.4 kg    Exam: General: Alert, no acute distress, appears much less cachectic than previous Respiratory: Lungs are clear to auscultation bilaterally, stable on room air Cardiovascular: Heart sounds S1-S2, extremities warm to touch with adequate capillary refill, no tachycardia Abdomen: Colostomy stoma pink with stool noted.  Some incisional tenderness but otherwise abdominal exam benign with normoactive bowel sounds.  Tolerating oral diet and has been consuming 100% of meals.  LBM 1/27 Genitourinary: Foley catheter  Neurologic: CN 2-12 grossly intact. Sensation intact, DTR normal. Strength 5/5 x all 4 extremities.  Psychiatric: Normal judgment and insight. Alert and oriented x 3.  Flat mood and affect.    Assessment/Plan: Acute problems: Acute renal failure on chronic kidney disease, stage V -Nephrology consulted and appreciated -Creatinine 5.76 in October 2021, although patient presented with a creatinine of 16.69 -Chronic kidney disease 2/2 longstanding obstruction.  Acute kidney injury due to dehydration and sepsis along with ATN with obstruction. -Interventional radiology consulted for bilateral percutaneous nephrostomy tube placement, 04/01/2020 as well as temporary HD catheter placement. -Patient did undergo hemodialysis, 2 cycles with last hemodialysis on 04/04/2019. -Creatinine has now plateaued, currently 5.08 -Though creatinine is still elevated, it is below his baseline.  Patient himself is unsure about wanting continuous HD.   -Nephrology recommended that patient follow-up with nephrology as an outpatient.   -Recommended continuing bicarb on discharge.  Recurrent orthostasis -Likely from Flomax-has chronic Foley catheter therefore do not see any harm at this juncture w/ discontinuing  Flomax -General surgery has ordered cosyntropin stimulation test which is normal -Continues to have episodes of orthostasis which are improving.  Hopeful in the outpatient setting can resume alpha blockers to treat PTSD and urinary symptoms  Bilateral hydronephrosis s/p cystoscopy,B ureteral stent placement w/ B retrograde pyelogram/bladder outlet obstruction -Per IR follow-up on 04/07/2019, patient had urinary obstruction secondary to pelvic mass with subsequent bilateral hydronephrosis in the setting of bilateral ureteral stents, bilateral PCNs inadvertently removed (unknown how long they have been out), status post bilateral PCN placement on 04/01/2020. -1/18 Dr Alinda Money performed concurrent procedure with surgical team (cystoscopy and bilateral ureteral stent change)  -Nephrostomy tubes removed under fluoroscopy on 1/25 -Presented w/ chronic Foley catheter as well. -Continue Foley until can tolerate alpha-blocker-need to delay consideration for discontinuation of Foley until outpatient setting  History of pelvic abscess/known colovesical fistula s/p lap sigmoid colectomy w/epigastric hernia repair (1/18) -Noted on 11/15/2019, status post left transgluteal drain by IR/colovesicular fistula -1/18 underwent diverting loop colostomy by [name] in conjunction with urological procedure by Dr. Alinda Money as described above -Primary GI is Dr. Rush Landmark.  -Participating with self care regarding colostomy-states assisted with changing of bag today  Goals of care -Palliative care consulted and appreciated -Patient full code and refused to discuss any decisions regarding plan of care -Patient should follow up with palliative care as an outpatient -1/19 D/w with patient's son [name].  Recommendation is to have eventual discussion with patient and if necessary family regarding instituting DNR status.  It is felt by the medical team that patient is so poorly conditioned that he would not tolerate a resuscitative  event and likely would not survive.  Emphasis would be on DNR clarification and not comfort care since at this juncture patient appears to be improving slightly although long-term prognosis is not good.  1/20 also discussed with patient and he agrees DNR at this juncture would be warranted given his poor status noting he would not likely survive a resuscitation event.  He wishes to discuss with family first  Severe physical deconditioning, history of prior CVA -July 2021, history of atrial fibrillation, patient is not a candidate for anticoagulation due to GI bleed and thrombocytopenia -Currently no focal deficits  Severe protein calorie malnutrition Nutrition Problem: Severe Malnutrition Etiology: chronic illness (colovesical fistula) Signs/Symptoms: moderate fat depletion,severe fat depletion,moderate muscle depletion,severe muscle depletion Interventions: MVI,Refer to RD note for recommendations Estimated body mass index is 20.78 kg/m as calculated from the following:   Height as of this encounter: 5\' 6"  (1.676 m).   Weight as of this encounter: 58.4 kg. -Postoperatively patient is reporting increased appetite.  Continue to follow intake.  May require calorie count  Chronic pain -Pt was taking a massive dose of Neurontin PTA (800 mg TID)-with his low GFR will give 200 mg bid 100 mg at HS  -Continue low-dose Zanaflex   Other problems: Severe dehydration with multiple metabolic derangements as follows: Severe metabolic acidosis, hyperkalemia, hyperphosphatemia -Resolved after rehydration, dialysis and Kayexalate  Severe sepsis secondary to urinary tract infection -Present on admission-sepsis physiology has now resolved -Patient presented with encephalopathy as well as hypothermia and leukocytosis -Blood cultures were negative x2 -Urine culture from 12/31 and 1/1 showed multiple species -CT renal stone study 12/31: Bladder wall appeared slightly thickened.  No significant fluid  collection in pelvis -Patient completed 7 days of IV cefepime  PTSD -1/24 will resume Vistaril 10 mg 3 times daily as needed and tizanidine 2 mg 3 times daily as needed for muscle spasms -Continue preadmission Cymbalta twice daily  History of gastric bypass  -Avoid NSAIDs (unable to use currently in context of chronic kidney disease)  Data Reviewed: Basic Metabolic Panel: Recent Labs  Lab 04/23/20 0144 04/24/20 0228 04/25/20 0308 04/26/20 0022  NA 139 135 138 137  K 4.4 3.9 3.6 3.5  CL 106 106 107 107  CO2 24 21* 22 22  GLUCOSE 98 96 81 159*  BUN 29* 32* 34* 34*  CREATININE 4.57* 5.06* 4.70* 4.42*  CALCIUM 8.0* 7.5* 7.6* 7.5*   Liver Function Tests: No results for input(s): AST, ALT, ALKPHOS, BILITOT, PROT, ALBUMIN in the last 168 hours. No results for input(s): LIPASE, AMYLASE in the last 168 hours. No results for input(s): AMMONIA in the last 168 hours. CBC: Recent Labs  Lab 04/23/20 0144 04/26/20 0022  WBC 6.6 5.1  HGB 8.9* 8.1*  HCT 29.5* 26.0*  MCV 98.7 97.0  PLT 312 284   Cardiac Enzymes: No results for input(s): CKTOTAL, CKMB, CKMBINDEX, TROPONINI in the last 168 hours. BNP (last 3 results) No results for input(s): BNP in the last 8760 hours.  ProBNP (last 3 results) No results for input(s): PROBNP in the last 8760 hours.  CBG: Recent Labs  Lab 04/23/20 1750 04/23/20 2125 04/24/20 0741 04/24/20 1211 04/24/20 1640  GLUCAP 241* 141* 123* 99 103*    No results found for this or any previous visit (from the past 240 hour(s)).   Studies: No results found.  Scheduled Meds: . sodium  chloride   Intravenous Once  . calcium carbonate  1 tablet Oral TID  . Chlorhexidine Gluconate Cloth  6 each Topical Daily  . cholecalciferol  1,000 Units Oral Daily  . DULoxetine  60 mg Oral Daily   And  . DULoxetine  30 mg Oral QHS  . gabapentin  200 mg Oral Daily  . gabapentin  300 mg Oral QHS  . heparin  5,000 Units Subcutaneous Q8H  . lip balm  1  application Topical BID  . multivitamin with minerals  1 tablet Oral BID  . pantoprazole  40 mg Oral QHS  . polycarbophil  625 mg Oral BID  . sodium bicarbonate  1,300 mg Oral TID  . sodium chloride flush  3 mL Intravenous Q12H   Continuous Infusions: . sodium chloride 250 mL (04/22/20 1039)    Active Problems:   CKD (chronic kidney disease) stage 5, GFR less than 15 ml/min (HCC)   Chronic pain   Protein-calorie malnutrition, severe   Pelvic abscess with colovescial fistula   Acute kidney failure ([place])   History of COVID-19   History of gastric bypass   Hodgkin lymphoma, unspecified, unspecified site (HCC)   Metabolic acidosis   Pernicious anemia   Toxic metabolic encephalopathy   PTSD (post-traumatic stress disorder)   Bilateral ureteral obstruction s/p perc nephrosotmy & stenting   Anxiety and depression   History of CVA (cerebrovascular accident)   BMI less than 19,adult   Chronic narcotic use   Anemia in chronic kidney disease   Incisional hernia - epigastric   Sepsis with acute renal failure without septic shock ([place])   DNR (do not resuscitate) discussion   Palliative care by specialist   Adult failure to thrive   Acute respiratory failure with hypoxia ([place])   Diverticulitis of sigmoid colon with abscess s/p colectomy/colostomy Jeanette Caprice) 04/18/2020   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[facility])   Personality disorder    Cachexia ([place])   Physical deconditioning   Consultants:    Procedures:    Antibiotics: Anti-infectives (From admission, onward)   Start     Dose/Rate Route Frequency Ordered Stop   04/19/20 0200  cefoTEtan (CEFOTAN) 2 g in sodium chloride 0.9 % 100 mL IVPB        2 g 200 mL/hr over 30 Minutes Intravenous Every 12 hours 04/18/20 1824 04/19/20 0238   04/18/20 1200  cefoTEtan (CEFOTAN) 2 g in sodium chloride 0.9 % 100 mL IVPB        2 g 200 mL/hr over 30 Minutes Intravenous To ShortStay [facility] 04/17/20 2206 04/18/20 1410   04/17/20 2215  neomycin  (MYCIFRADIN) tablet 1,000 mg       "And" Linked Group Details   1,000 mg Oral  Once 04/17/20 2206 04/17/20 2348   04/17/20 2215  metroNIDAZOLE (FLAGYL) tablet 1,000 mg       "And" Linked Group Details   1,000 mg Oral  Once 04/17/20 2206 04/17/20 2336   04/13/20 1145  cefoTEtan (CEFOTAN) 2 g in sodium chloride 0.9 % 100 mL IVPB        2 g 200 mL/hr over 30 Minutes Intravenous On call to O.R. 04/12/20 2006 04/14/20 0559   04/12/20 2100  neomycin (MYCIFRADIN) tablet 1,000 mg       "And" Linked Group Details   1,000 mg Oral 3 times per day 04/12/20 2006 04/13/20 0527   04/12/20 2100  metroNIDAZOLE (FLAGYL) tablet 1,000 mg       "And" Linked Group Details   1,000 mg  Oral 3 times per day 04/12/20 2006 04/13/20 0527   04/03/20 1600  ceFEPIme (MAXIPIME) 1 g in sodium chloride 0.9 % 100 mL IVPB        1 g 200 mL/hr over 30 Minutes Intravenous Every 24 hours 04/03/20 0918 04/07/20 1108   04/01/20 1600  ceFEPIme (MAXIPIME) 1 g in sodium chloride 0.9 % 100 mL IVPB  Status:  Discontinued        1 g 200 mL/hr over 30 Minutes Intravenous Every 24 hours 03/31/20 1550 04/03/20 0918   04/01/20 1145  cefTRIAXone (ROCEPHIN) 1 g in sodium chloride 0.9 % 100 mL IVPB        1 g 200 mL/hr over 30 Minutes Intravenous  Once 04/01/20 1057 04/01/20 1308   03/31/20 1415  ceFEPIme (MAXIPIME) 2 g in sodium chloride 0.9 % 100 mL IVPB        2 g 200 mL/hr over 30 Minutes Intravenous  Once 03/31/20 1407 03/31/20 1516       Time spent:  20 minutes    Charlynne Cousins ANP  [facility] 7 am - 330 pm/M-F for direct patient care and secure chat Please refer to Amion for contact info 29  days

## 2020-04-30 DIAGNOSIS — N179 Acute kidney failure, unspecified: Secondary | ICD-10-CM | POA: Diagnosis not present

## 2020-04-30 NOTE — Progress Notes (Signed)
[facility] PROGRESS NOTE  Chase [name] PTW:[id number] DOB: [birth year] DOA: 03/31/2020 PCP: [name]    Status: Remains inpatient appropriate because:Unsafe d/c plan and Inpatient level of care appropriate due to severity of illness   Dispo: The patient is from: Home              Anticipated d/c is to: SNF              Anticipated d/c date is: > 3 days              Patient currently is not medically stable to d/c. Barriers to discharge: Status post diverting loop colostomy as well as internal stent placement, PT recommending SNF at present we have not received any bed offers from [place] affiliated facilities.  As of 1/27 patient has been making significant improvement in overall physical mental and nutritional status.  At this juncture he prefers to discharge home with home health.  TOC aware and orders have been placed.  Plan is to continue current hospitalization and aggressive inpatient PT and will reevaluate on Monday, February 1 to determine if patient appropriate for discharge home either February 2 of February 3.  Patient states mom will be able to stay with him for several weeks after discharge but needs advance notice before discharge.   Code Status: Full code Family Communication: Patient daily; at length with son Chase [name] on 1/19. DVT prophylaxis: SQ heparin Vaccination status: Did receive 1 dose of the Moderna COVID-vaccine.  Subsequently refusing any future doses or even a booster shot.  States he had COVID 3 months ago and has reduced immune system and does not feel these immunizations are effective for him.  Foley catheter: Presented with chronic Foley catheter  HPI: HPI on 03/31/2020 by Dr. 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 Patient was reportedly seen by neighbors yesterday in a recliner and was then found on the floor today obtunded. He was sent to [facility] emergency department and is unable to provide history due to his clinical condition.  Interim history Admitted with acute renal failure on chronic kidney disease, stage V secondary to urinary obstruction and dehydration with electrolyte abnormalities.  Nephrology consulted.  Patient did undergo hemodialysis.  IR placed bilateral percutaneous nephrostomy tubes.  Creatinine did improve with a couple cycles of hemodialysis however plateaued.  Hemodialysis catheter was then removed.  No further plans for hemodialysis at this time.  Nephrology feels that patient can be discharged home with outpatient follow-up.  Since admission patient has undergone dual procedure that included diverting loop colostomy and replacement of internal ureteral stents.  Urine output and renal function has remained stable therefore urology has discontinued nephrostomy tubes.  Patient's affect and appetite have improved post diverting colostomy.  PT and OT continue to recommend SNF placement for severe deconditioning.   Subjective: Awake and alert, sitting up in chair with no complaints.  Discussed tentative discharge timing and plans.  Patient appreciative that we will attempt to procure as many medical supplies as possible before discharge.  Objective: Vitals:   04/29/20 2007 04/30/20 0510  BP: 102/63 119/70  Pulse: 76 68  Resp: 18 16  Temp: 98.2 F (36.8 C) 98.7 F (37.1 C)  SpO2: 98% 98%    Intake/Output Summary (Last 24 hours) at 04/30/2020 0935 Last data  filed at 04/29/2020 2044 Gross per 24 hour  Intake 720 ml  Output 2100 ml  Net -1380 ml   Filed Weights   04/20/20 0500 04/24/20 0500 04/25/20 0526  Weight: 55.6 kg 59.2 kg 58.4 kg    Exam: General: Alert, no acute distress, appears much less cachectic than previous Respiratory: Lungs are clear to auscultation bilaterally, stable on room air Cardiovascular: Heart sounds S1-S2, extremities warm to touch with adequate capillary refill, no tachycardia Abdomen: Colostomy stoma pink with stool noted.  Some incisional tenderness but otherwise abdominal exam benign with normoactive bowel sounds.  Tolerating oral diet and has been consuming 100% of meals.  LBM 1/27 Genitourinary: Foley catheter  Neurologic: CN 2-12 grossly intact. Sensation intact, DTR normal. Strength 5/5 x all 4 extremities.  Psychiatric: Normal judgment and insight. Alert and oriented x 3.  Flat mood and affect.    Assessment/Plan: Acute problems: Acute renal failure on chronic kidney disease, stage V -Nephrology consulted and appreciated -Creatinine 5.76 in October 2021, although patient presented with a creatinine of 16.69 -Chronic kidney disease 2/2 longstanding obstruction.  Acute kidney injury due to dehydration and sepsis along with ATN with obstruction. -Interventional radiology consulted for bilateral percutaneous nephrostomy tube placement, 04/01/2020 as well as temporary HD catheter placement. -Patient did undergo hemodialysis, 2 cycles with last hemodialysis on 57/05/2019. -Creatinine has now plateaued, currently 5.08 -Though creatinine is still elevated, it is below his baseline.  Patient himself is unsure about wanting continuous HD.   -Nephrology recommended that patient follow-up with nephrology as an outpatient.   -Recommended continuing bicarb on discharge.  Recurrent orthostasis -Likely from Flomax-has chronic Foley catheter therefore do not see any harm at this juncture w/ discontinuing Flomax -General  surgery has ordered cosyntropin stimulation test which is normal -Continues to have episodes of orthostasis which are improving.  Hopeful in the outpatient setting can resume alpha blockers to treat PTSD and urinary symptoms  Bilateral hydronephrosis s/p cystoscopy,B ureteral stent placement w/ B retrograde pyelogram/bladder outlet obstruction -Per IR follow-up on 04/07/2019, patient had urinary obstruction secondary to pelvic mass with subsequent bilateral hydronephrosis in the setting of bilateral ureteral stents, bilateral PCNs inadvertently removed (unknown how long they have been out), status post bilateral PCN placement on 04/01/2020. -1/18 Dr Alinda Money performed concurrent procedure with surgical team (cystoscopy and bilateral ureteral stent change)  -Nephrostomy tubes removed under fluoroscopy on 1/25 -Presented w/ chronic Foley catheter as well. -Continue Foley until can tolerate alpha-blocker-need to delay consideration for discontinuation of Foley until outpatient setting  History of pelvic abscess/known colovesical fistula s/p lap sigmoid colectomy w/epigastric hernia repair (1/18) -Noted on 11/15/2019, status post left transgluteal drain by IR/colovesicular fistula -1/18 underwent diverting loop colostomy by [name] in conjunction with urological procedure by Dr. Alinda Money as described above -Primary GI is Dr. Rush Landmark.  -Participating with self care regarding colostomy-states assisted with changing of bag today  Goals of care -Palliative care consulted and appreciated -Patient full code and refused to discuss any decisions regarding plan of care -Patient should follow up with palliative care as an outpatient -1/19 D/w with patient's son Chase [name].  Recommendation is to have eventual discussion with patient and if necessary family regarding instituting DNR status.  It  is felt by the medical team that patient is so poorly conditioned that he would not tolerate a resuscitative event and likely  would not survive.  Emphasis would be on DNR clarification and not comfort care since at this juncture patient appears to be improving slightly although long-term prognosis is not good.  1/20 also discussed with patient and he agrees DNR at this juncture would be warranted given his poor status noting he would not likely survive a resuscitation event.  He wishes to discuss with family first  Severe physical deconditioning, history of prior CVA -July 2021, history of atrial fibrillation, patient is not a candidate for anticoagulation due to GI bleed and thrombocytopenia -Currently no focal deficits  Severe protein calorie malnutrition Nutrition Problem: Severe Malnutrition Etiology: chronic illness (colovesical fistula) Signs/Symptoms: moderate fat depletion,severe fat depletion,moderate muscle depletion,severe muscle depletion Interventions: MVI,Refer to RD note for recommendations Estimated body mass index is 20.78 kg/m as calculated from the following:   Height as of this encounter: 5\' 6"  (1.676 m).   Weight as of this encounter: 58.4 kg. -Postoperatively patient is reporting increased appetite.  Continue to follow intake.  May require calorie count  Chronic pain -Pt was taking a massive dose of Neurontin PTA (800 mg TID)-with his low GFR will give 200 mg bid 100 mg at HS  -Continue low-dose Zanaflex   Other problems: Severe dehydration with multiple metabolic derangements as follows: Severe metabolic acidosis, hyperkalemia, hyperphosphatemia -Resolved after rehydration, dialysis and Kayexalate  Severe sepsis secondary to urinary tract infection -Present on admission-sepsis physiology has now resolved -Patient presented with encephalopathy as well as hypothermia and leukocytosis -Blood cultures were negative x2 -Urine culture from 12/31 and 1/1 showed multiple species -CT renal stone study 12/31: Bladder wall appeared slightly thickened.  No significant fluid collection in  pelvis -Patient completed 7 days of IV cefepime  PTSD -1/24 will resume Vistaril 10 mg 3 times daily as needed and tizanidine 2 mg 3 times daily as needed for muscle spasms -Continue preadmission Cymbalta twice daily  History of gastric bypass  -Avoid NSAIDs (unable to use currently in context of chronic kidney disease)  Data Reviewed: Basic Metabolic Panel: Recent Labs  Lab 04/24/20 0228 04/25/20 0308 04/26/20 0022  NA 135 138 137  K 3.9 3.6 3.5  CL 106 107 107  CO2 21* 22 22  GLUCOSE 96 81 159*  BUN 32* 34* 34*  CREATININE 5.06* 4.70* 4.42*  CALCIUM 7.5* 7.6* 7.5*   Liver Function Tests: No results for input(s): AST, ALT, ALKPHOS, BILITOT, PROT, ALBUMIN in the last 168 hours. No results for input(s): LIPASE, AMYLASE in the last 168 hours. No results for input(s): AMMONIA in the last 168 hours. CBC: Recent Labs  Lab 04/26/20 0022  WBC 5.1  HGB 8.1*  HCT 26.0*  MCV 97.0  PLT 284   Cardiac Enzymes: No results for input(s): CKTOTAL, CKMB, CKMBINDEX, TROPONINI in the last 168 hours. BNP (last 3 results) No results for input(s): BNP in the last 8760 hours.  ProBNP (last 3 results) No results for input(s): PROBNP in the last 8760 hours.  CBG: Recent Labs  Lab 04/23/20 1750 04/23/20 2125 04/24/20 0741 04/24/20 1211 04/24/20 1640  GLUCAP 241* 141* 123* 99 103*    No results found for this or any previous visit (from the past 240 hour(s)).   Studies: No results found.  Scheduled Meds: . sodium chloride   Intravenous Once  . calcium carbonate  1 tablet Oral TID  . Chlorhexidine Gluconate  Cloth  6 each Topical Daily  . cholecalciferol  1,000 Units Oral Daily  . DULoxetine  60 mg Oral Daily   And  . DULoxetine  30 mg Oral QHS  . gabapentin  200 mg Oral Daily  . gabapentin  300 mg Oral QHS  . heparin  5,000 Units Subcutaneous Q8H  . lip balm  1 application Topical BID  . multivitamin with minerals  1 tablet Oral BID  . pantoprazole  40 mg Oral QHS  .  polycarbophil  625 mg Oral BID  . sodium bicarbonate  1,300 mg Oral TID  . sodium chloride flush  3 mL Intravenous Q12H   Continuous Infusions: . sodium chloride 250 mL (04/22/20 1039)    Active Problems:   CKD (chronic kidney disease) stage 5, GFR less than 15 ml/min (HCC)   Chronic pain   Protein-calorie malnutrition, severe   Pelvic abscess with colovescial fistula   Acute kidney failure ([place])   History of COVID-19   History of gastric bypass   Hodgkin lymphoma, unspecified, unspecified site (HCC)   Metabolic acidosis   Pernicious anemia   Toxic metabolic encephalopathy   PTSD (post-traumatic stress disorder)   Bilateral ureteral obstruction s/p perc nephrosotmy & stenting   Anxiety and depression   History of CVA (cerebrovascular accident)   BMI less than 19,adult   Chronic narcotic use   Anemia in chronic kidney disease   Incisional hernia - epigastric   Sepsis with acute renal failure without septic shock ([place])   DNR (do not resuscitate) discussion   Palliative care by specialist   Adult failure to thrive   Acute respiratory failure with hypoxia ([place])   Diverticulitis of sigmoid colon with abscess s/p colectomy/colostomy Jeanette Caprice) 04/18/2020   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[facility])   Personality disorder    Cachexia ([place])   Physical deconditioning   Consultants:    Procedures:    Antibiotics: Anti-infectives (From admission, onward)   Start     Dose/Rate Route Frequency Ordered Stop   04/19/20 0200  cefoTEtan (CEFOTAN) 2 g in sodium chloride 0.9 % 100 mL IVPB        2 g 200 mL/hr over 30 Minutes Intravenous Every 12 hours 04/18/20 1824 04/19/20 0238   04/18/20 1200  cefoTEtan (CEFOTAN) 2 g in sodium chloride 0.9 % 100 mL IVPB        2 g 200 mL/hr over 30 Minutes Intravenous To ShortStay Surgical 04/17/20 2206 04/18/20 1410   04/17/20 2215  neomycin (MYCIFRADIN) tablet 1,000 mg       "And" Linked Group Details   1,000 mg Oral  Once 04/17/20 2206 04/17/20 2348    04/17/20 2215  metroNIDAZOLE (FLAGYL) tablet 1,000 mg       "And" Linked Group Details   1,000 mg Oral  Once 04/17/20 2206 04/17/20 2336   04/13/20 1145  cefoTEtan (CEFOTAN) 2 g in sodium chloride 0.9 % 100 mL IVPB        2 g 200 mL/hr over 30 Minutes Intravenous On call to O.R. 04/12/20 2006 04/14/20 0559   04/12/20 2100  neomycin (MYCIFRADIN) tablet 1,000 mg       "And" Linked Group Details   1,000 mg Oral 3 times per day 04/12/20 2006 04/13/20 0527   04/12/20 2100  metroNIDAZOLE (FLAGYL) tablet 1,000 mg       "And" Linked Group Details   1,000 mg Oral 3 times per day 04/12/20 2006 04/13/20 0527   04/03/20 1600  ceFEPIme (MAXIPIME) 1 g  in sodium chloride 0.9 % 100 mL IVPB        1 g 200 mL/hr over 30 Minutes Intravenous Every 24 hours 04/03/20 0918 04/07/20 1108   04/01/20 1600  ceFEPIme (MAXIPIME) 1 g in sodium chloride 0.9 % 100 mL IVPB  Status:  Discontinued        1 g 200 mL/hr over 30 Minutes Intravenous Every 24 hours 03/31/20 1550 04/03/20 0918   04/01/20 1145  cefTRIAXone (ROCEPHIN) 1 g in sodium chloride 0.9 % 100 mL IVPB        1 g 200 mL/hr over 30 Minutes Intravenous  Once 04/01/20 1057 04/01/20 1308   03/31/20 1415  ceFEPIme (MAXIPIME) 2 g in sodium chloride 0.9 % 100 mL IVPB        2 g 200 mL/hr over 30 Minutes Intravenous  Once 03/31/20 1407 03/31/20 1516       Time spent:  20 minutes    Charlynne Cousins ANP  [facility] 7 am - 330 pm/M-F for direct patient care and secure chat Please refer to Amion for contact info 30  days

## 2020-05-01 ENCOUNTER — Inpatient Hospital Stay (HOSPITAL_COMMUNITY): Payer: No Typology Code available for payment source

## 2020-05-01 MED ORDER — GABAPENTIN 300 MG PO CAPS: 300.0000 mg | ORAL_CAPSULE | Freq: Every day | ORAL | 3 refills | Status: DC

## 2020-05-01 MED ORDER — OXYCODONE HCL 5 MG PO TABS: 5.0000 mg | ORAL_TABLET | ORAL | 0 refills | Status: DC | PRN

## 2020-05-01 MED ORDER — TIZANIDINE HCL 2 MG PO TABS: 2.0000 mg | ORAL_TABLET | Freq: Three times a day (TID) | ORAL | 0 refills | Status: DC | PRN

## 2020-05-01 MED ORDER — ACETAMINOPHEN 500 MG PO TABS: 500.0000 mg | ORAL_TABLET | Freq: Four times a day (QID) | ORAL | 0 refills | Status: DC | PRN

## 2020-05-01 MED ORDER — CALCIUM POLYCARBOPHIL 625 MG PO TABS: 625.0000 mg | ORAL_TABLET | Freq: Two times a day (BID) | ORAL | 3 refills | Status: DC

## 2020-05-01 MED ORDER — CALCIUM CARBONATE ANTACID 500 MG PO CHEW: 1.0000 | CHEWABLE_TABLET | Freq: Three times a day (TID) | ORAL | Status: AC

## 2020-05-01 NOTE — Consult Note (Addendum)
[place] Nurse ostomy follow up Patient receiving care in [facility] [phone] Stoma type/location: LLQ colostomy Stomal assessment/size: Round, budded, pink and moist. 1 3/4"  Peristomal assessment: intact with minor bleeding at the mucocutaneous junction.  Treatment options for stomal/peristomal skin: barrier rings Kellie Simmering # [phone]) Output: brown mushy Ostomy pouching: 2pc. 2 3/4" pouch Kellie Simmering # [phone]). Skin barrier Kellie Simmering #[phone]) Education provided: Patient was able to do the complete pouch application. Shower was completed then the patient removed the old pouch, cleaned around the stoma with plain water. Some bleeding was noted. Was able to measure to confirm 1 3/4" size has not changed. Dabbed around the stoma with 44M barrier wipe, let air dry. Patient then cut the skin barrier to size, (experiences some tremor so he does have some difficulty with this). He cut it a little too big so I had him to apply a barrier ring to protect the surrounding skin. He was able to close the pouch and double check to make sure the Velcro was in place, then he applied the skin barrier and placed the pouch on with a double check to make sure that the pouch was secured in place. No further teaching necessary. WOC will sign off.    Cathlean Marseilles Tamala Julian, MSN, RN, [place], Lysle Pearl, [facility] Wound Treatment Associate Pager [phone]

## 2020-05-01 NOTE — Consult Note (Signed)
[place] Nurse ostomy follow up Patient receiving care in [place] with patient and he is going to take a shower with OT assistance and desires to take his pouch off and shower and then place a new pouch on. [place] with his bedside RN Sharee Pimple will make sure that he does the pouch application 799% himself so that he will know how to do it when he gets home. CM is working on getting his supplies through [facility] program. Reminded the patient to take the left over supplies in the room home with him when he is discharged. Page if needed.  Cathlean Marseilles Tamala Julian, MSN, RN, [place], Lysle Pearl, [facility] Wound Treatment Associate Pager [phone]

## 2020-05-01 NOTE — Progress Notes (Signed)
Occupational Therapy Treatment Patient Details Name: Chase MEUTH Sr. MRN: [id number] DOB: 06/06/1965 Today's Date: 05/01/2020    History of present illness 55 year old male with PMH of Hodgkin's lymphoma s/p chemotherapy 2011-2012, gastric bypass in 4580 complicated by anastomotic ulcer in 2014, colovesical fistula with abscess s/p PC drain, stage IV CKD, bilateral ureteral stenosis with ureteral stents and bilateral percutaneous nephrostomy tubes, history of CVA in 2021 (residual L sided weakness). He was admitted for acute renal failure complicating stage V CKD secondary to urinary obstruction and dehydration with severe electrolyte derangements.  Urology was consulted.  Patient underwent hemodialysis. s/p ureteral stent placement, retrograde pyelogram, lap sigmoid colectomy with colostomy, and epigastric hernia repair on 1/18. B PCN tubes removed 1/25.   OT comments  Pt premedicated for pain prior to showering seated on shower seat. Bathed with set up/supervision and dressed with set up seated on 3 in 1. Pt ambulated with min guard assist and RW and left with [place] nurse up in chair.   Follow Up Recommendations  Home health OT    Equipment Recommendations  None recommended by OT    Recommendations for Other Services      Precautions / Restrictions Precautions Precautions: Fall Precaution Comments: colostomy, long term foley       Mobility Bed Mobility Overal bed mobility: Modified Independent                Transfers Overall transfer level: Needs assistance Equipment used: Rolling walker (2 wheeled) Transfers: Sit to/from Stand Sit to Stand: Min guard         General transfer comment: from bed, shower seat and BSC    Balance     Sitting balance-Leahy Scale: Good     Standing balance support: Bilateral upper extremity supported Standing balance-Leahy Scale: Poor                             ADL either performed or assessed with clinical  judgement   ADL Overall ADL's : Needs assistance/impaired         Upper Body Bathing: Supervision/ safety;Sitting   Lower Body Bathing: Supervison/ safety;Sitting/lateral leans   Upper Body Dressing : Set up;Sitting   Lower Body Dressing: Set up;Sitting/lateral leans               Functional mobility during ADLs: Min guard;Rolling walker General ADL Comments: pt sat to shower, ostomy nurse came at end of session to help with colostomy bag change     Vision       Perception     Praxis      Cognition Arousal/Alertness: Awake/alert Behavior During Therapy: WFL for tasks assessed/performed Overall Cognitive Status: Within Functional Limits for tasks assessed                                          Exercises     Shoulder Instructions       General Comments      Pertinent Vitals/ Pain       Pain Assessment: Faces Faces Pain Scale: Hurts a little bit Pain Location: back Pain Descriptors / Indicators: Discomfort;Sore Pain Intervention(s): Premedicated before session  Home Living  Prior Functioning/Environment              Frequency  Min 2X/week        Progress Toward Goals  OT Goals(current goals can now be found in the care plan section)  Progress towards OT goals: Progressing toward goals  Acute Rehab OT Goals Patient Stated Goal: go home OT Goal Formulation: With patient Time For Goal Achievement: 05/04/20 Potential to Achieve Goals: Good  Plan Discharge plan remains appropriate    Co-evaluation                 AM-PAC OT "6 Clicks" Daily Activity     Outcome Measure   Help from another person eating meals?: None Help from another person taking care of personal grooming?: A Little Help from another person toileting, which includes using toliet, bedpan, or urinal?: A Little Help from another person bathing (including washing, rinsing, drying)?: A  Little Help from another person to put on and taking off regular upper body clothing?: None Help from another person to put on and taking off regular lower body clothing?: A Little 6 Click Score: 20    End of Session    OT Visit Diagnosis: Unsteadiness on feet (R26.81);Other abnormalities of gait and mobility (R26.89);Muscle weakness (generalized) (M62.81);Adult, failure to thrive (R62.7)   Activity Tolerance Patient tolerated treatment well   Patient Left in chair;with nursing/sitter in room (with ostomy nurse)   Nurse Communication  (needs dressing changes to bottom)        Time: 4403-4742 OT Time Calculation (min): 50 min  Charges: OT General Charges $OT Visit: 1 Visit OT Treatments $Self Care/Home Management : 38-52 mins  Nestor Lewandowsky, OTR/L Acute Rehabilitation Services Pager: [phone] Office: [phone]   Malka So 05/01/2020, 2:23 PM

## 2020-05-01 NOTE — Progress Notes (Signed)
PT Cancellation Note  Patient Details Name: Chase [name]. MRN: [id number] DOB: 10/18/1965   Cancelled Treatment:    Reason Eval/Treat Not Completed: Pain limiting ability to participate;Fatigue/lethargy limiting ability to participate Pt requesting to sleep this AM and not be disturbed due to having hurt his back when going down for CT earlier this AM, per RN. Plan to work with OT and [place] in PM. Will follow.   Marguarite Arbour A [place] Fant 05/01/2020, 12:13 PM Marisa Severin, PT, DPT Acute Rehabilitation Services Pager [phone] Office [phone]

## 2020-05-01 NOTE — Progress Notes (Signed)
Patient ID: Chase [name]., male   DOB: 1965/12/05, 54 y.o.   MRN: [id number]  13 Days Post-Op Subjective: Pt continues to get stronger.  Eating better.    Objective: Vital signs in last 24 hours: Temp:  [98.1 F (36.7 C)-98.3 F (36.8 C)] 98.1 F (36.7 C) (01/31 1343) Pulse Rate:  [72-74] 72 (01/31 1343) Resp:  [14-16] 14 (01/31 1343) BP: (109-124)/(62-77) 124/77 (01/31 1343) SpO2:  [98 %-100 %] 100 % (01/31 1343)  Intake/Output from previous day: 01/30 0701 - 01/31 0700 In: 820 [P.O.:820] Out: 3426 [Urine:3025; Stool:401] Intake/Output this shift: Total I/O In: 1140 [P.O.:240; Other:900] Out: 826 [Urine:825; Stool:1]  Physical Exam:  General: Alert and oriented GU: Urine clear  Lab Results: No results for input(s): HGB, HCT in the last 72 hours. BMET No results for input(s): NA, K, CL, CO2, GLUCOSE, BUN, CREATININE, CALCIUM in the last 72 hours.   Studies/Results: CT CYSTOGRAM PELVIS  Result Date: 05/01/2020 CLINICAL DATA:  Evaluate for colovesical fistula EXAM: CT CYSTOGRAM (CT PELVIS WITH CONTRAST) TECHNIQUE: Multidetector CT imaging through the pelvis was performed after dilute contrast had been introduced into the bladder for the purposes of performing CT cystography. CONTRAST:  35mL OMNIPAQUE IOHEXOL 300 MG/ML  SOLN COMPARISON:  04/20/2020 FINDINGS: Urinary Tract: Bilateral nephroureteral stents are identified. A Foley catheter balloon is in place. There is a small amount of gas within the nondependent portion of the urinary bladder. Retrograde opacification of the urinary bladder was achieved with 50 cc of dilute Omnipaque 300 via indwelling Foley catheter. Reflux of contrast within both ureters noted. Opacification of several small posterior and right lateral intramural bladder diverticula noted. No extravasation of the contrast material identified in no fistulous communication with the surrounding bowel loops including the rectum. Bowel: Left lower quadrant loop  colostomy. Retained contrast material within the excluded rectum (Hartmann's pouch) was noted on the precontrast images. The volume of high attenuation material within the rectum is unchanged between the pre and postcontrast images. Vascular/Lymphatic: Aortic atherosclerosis. No abdominopelvic adenopathy. Reproductive:  Prostate gland enlargement. Other:  No free fluid or fluid collections identified. Musculoskeletal: No suspicious bone lesions identified. IMPRESSION: 1. No evidence for colovesical fistula. 2. Bilateral nephroureteral stents are identified. 3. Prostate gland enlargement. 4. Aortic atherosclerosis. Aortic Atherosclerosis (ICD10-I70.0). Electronically Signed   By: Chase [name] M.D.   On: 05/01/2020 10:47    Assessment/Plan: 1) Colovesical fistula:Repeat CT cystogram today shows resolved colovesical fistula. 2) Bilateral ureteral obstruction:Renal function stable s/p nephrostomy tube removal.  Has indwelling stents and will consider changing them or removing them within the next 3 months. 3) Bladder outlet obstruction: Tamsulosin on hold due to syncopal episode. Can try to rechallenge him with alpha blocker when appropriate. Will plan to proceed with an outpatient voiding trial in about 2 weeks now that fistula is resolved.  - Pt is stable for d/c from a urologic standpoint.  Will arrange outpatient f/u.   LOS: 31 days   Chase [name] 05/01/2020, 5:14 PM

## 2020-05-01 NOTE — Progress Notes (Addendum)
[facility] PROGRESS NOTE  Chase [name] QIO:[id number] DOB: Mar 26, 1966 DOA: 03/31/2020 PCP: [name]        04/14/20                            04/27/20                        04/28/20     Status: Remains inpatient appropriate because:Unsafe d/c plan and Inpatient level of care appropriate due to severity of illness   Dispo: The patient is from: Home              Anticipated d/c is to: SNF              Anticipated d/c date is: > 3 days              Patient currently is medically stable to d/c. Barriers to discharge: Awaiting results of CT cystogram; pt's mother unable to pick pt up until 05/03/2020  Code Status: Full code Family Communication: Patient daily; at length with son [name] on 1/19. DVT prophylaxis: SQ heparin Vaccination status: Did receive 1 dose of the Moderna COVID-vaccine.  Subsequently refusing any future doses or even a booster shot.  States he had COVID 3 months ago and has reduced immune system and does not feel these immunizations are effective for him.  Foley catheter: Presented with chronic Foley catheter  HPI: HPI on 03/31/2020 by Dr. 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 Patient was reportedly seen by neighbors yesterday in a recliner and was then found on the floor today obtunded. He was sent to [facility] emergency department and is unable to provide history due to his clinical  condition.  Interim history Admitted with acute renal failure on chronic kidney disease, stage V secondary to urinary obstruction and dehydration with electrolyte abnormalities.  Nephrology consulted.  Patient did undergo hemodialysis.  IR placed bilateral percutaneous nephrostomy tubes.  Creatinine did improve with a couple cycles of hemodialysis however plateaued.  Hemodialysis catheter was then removed.  No further plans for hemodialysis at this time.  Nephrology feels that patient can be discharged home with outpatient follow-up.  Since admission patient has undergone dual procedure that included diverting loop colostomy and replacement of internal ureteral stents.  Urine output and renal function has remained stable therefore urology has discontinued nephrostomy tubes.  Patient's affect and appetite have improved post diverting colostomy.  PT and OT continue to recommend SNF placement for severe deconditioning.   Subjective: Alert.  Confirms his mother will not be able to come pick him up until Wednesday.  States physically has to present to [place] in [place] to pick up his prescriptions- no complaints verbalized.  Objective: Vitals:   04/30/20 1924 05/01/20 0352  BP: 110/63 109/62  Pulse: 74 73  Resp: 16 16  Temp: 98.3 F (36.8 C) 98.2 F (36.8 C)  SpO2: 98% 98%    Intake/Output Summary (Last 24 hours) at 05/01/2020 [phone]  Last data filed at 05/01/2020 0644 Gross per 24 hour  Intake 820 ml  Output 3426 ml  Net -2606 ml   Filed Weights   04/20/20 0500 04/24/20 0500 04/25/20 0526  Weight: 55.6 kg 59.2 kg 58.4 kg    Exam: General: Alert, calm, much less cachectic in appearance Respiratory: Bilateral lung sounds clear to auscultation, no increased work of breathing, stable on room air Cardiovascular: Normal heart sounds S1-S2, no tachycardia, extremities warm to touch Abdomen: 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with stool noted in collection bag, bowel sounds present.  Minimally tender over  incision sites.  LBM 1/31 Genitourinary: Chronic Foley catheter  Neurologic: CN 2-12 grossly intact. Sensation intact, DTR normal. Strength 5/5 x all 4 extremities.  Psychiatric: Normal judgment and insight. Alert and oriented x 3.  Flat mood and affect.    Assessment/Plan: Acute problems: Acute renal failure on chronic kidney disease, stage V -Nephrology consulted and appreciated -Creatinine 5.76 in October 2021, although patient presented with a creatinine of 16.69 -Chronic kidney disease 2/2 longstanding obstruction.  Acute kidney injury due to dehydration and sepsis along with ATN with obstruction. -Interventional radiology consulted for bilateral percutaneous nephrostomy tube placement, 04/01/2020 as well as temporary HD catheter placement. -Patient did undergo hemodialysis, 2 cycles with last hemodialysis on 04/04/2019. -Creatinine has now plateaued, currently 5.08 -Though creatinine is still elevated, it is below his baseline.  Patient himself is unsure about wanting continuous HD.   -Nephrology recommended that patient follow-up with nephrology as an outpatient.   -Recommended continuing bicarb on discharge.  Recurrent orthostasis -Likely from Flomax-has chronic Foley catheter therefore do not see any harm at this juncture w/ discontinuing Flomax -General surgery has ordered cosyntropin stimulation test which is normal -Continues to have episodes of orthostasis which are improving.  Hopeful in the outpatient setting can resume alpha blockers to treat PTSD and urinary symptoms  Bilateral hydronephrosis s/p cystoscopy,B ureteral stent placement w/ B retrograde pyelogram/bladder outlet obstruction -Per IR follow-up on 04/07/2019, patient had urinary obstruction secondary to pelvic mass with subsequent bilateral hydronephrosis in the setting of bilateral ureteral stents, bilateral PCNs inadvertently removed (unknown how long they have been out), status post bilateral PCN placement on  04/01/2020. -1/18 Dr Alinda Money performed concurrent procedure with surgical team (cystoscopy and bilateral ureteral stent change)  -Nephrostomy tubes removed under fluoroscopy on 1/25 -Presented w/ chronic Foley catheter as well. -Continue Foley until can tolerate alpha-blocker-need to delay consideration for discontinuation of Foley until outpatient setting -1/31 Dr. Alinda Money with urology plans cystogram today re is regarding contrast noted and rectal stump  History of pelvic abscess/known colovesical fistula s/p lap sigmoid colectomy w/epigastric hernia repair (1/18) -Noted on 11/15/2019, status post left transgluteal drain by IR/colovesicular fistula -1/18 underwent diverting loop colostomy by [name] in conjunction with urological procedure by Dr. Alinda Money as described above -Primary GI is Dr. Rush Landmark.  -Participating with self care regarding colostomy-states assisted with changing of bag today  Goals of care -Palliative care consulted and appreciated -Patient should follow up with palliative care as an outpatient-patient has previously stated that he wishes to discuss this with his sons and family members before making a final decision.  Severe physical deconditioning, history of prior CVA -July 2021, history of atrial fibrillation, patient is not a candidate for anticoagulation due to GI bleed and thrombocytopenia -Currently no focal deficits  Severe protein calorie malnutrition Nutrition Problem: Severe Malnutrition Etiology: chronic illness (colovesical fistula) Signs/Symptoms: moderate fat depletion,severe fat depletion,moderate muscle depletion,severe muscle depletion Interventions: MVI,Refer to RD  note for recommendations Estimated body mass index is 20.78 kg/m as calculated from the following:   Height as of this encounter: 5\' 6"  (1.676 m).   Weight as of this encounter: 58.4 kg. -Postoperatively patient is reporting increased appetite.  Continue to follow intake.  May require  calorie count  Chronic pain -Pt was taking a massive dose of Neurontin PTA (800 mg TID)-with his low GFR will give 200 mg bid 100 mg at HS  -Continue low-dose Zanaflex   Other problems: Severe dehydration with multiple metabolic derangements as follows: Severe metabolic acidosis, hyperkalemia, hyperphosphatemia -Resolved after rehydration, dialysis and Kayexalate  Severe sepsis secondary to urinary tract infection -Present on admission-sepsis physiology has now resolved -Patient presented with encephalopathy as well as hypothermia and leukocytosis -Blood cultures were negative x2 -Urine culture from 12/31 and 1/1 showed multiple species -CT renal stone study 12/31: Bladder wall appeared slightly thickened.  No significant fluid collection in pelvis -Patient completed 7 days of IV cefepime  PTSD -1/24 will resume Vistaril 10 mg 3 times daily as needed and tizanidine 2 mg 3 times daily as needed for muscle spasms -Continue preadmission Cymbalta twice daily  History of gastric bypass  -Avoid NSAIDs (unable to use currently in context of chronic kidney disease)  Data Reviewed: Basic Metabolic Panel: Recent Labs  Lab 04/25/20 0308 04/26/20 0022  NA 138 137  K 3.6 3.5  CL 107 107  CO2 22 22  GLUCOSE 81 159*  BUN 34* 34*  CREATININE 4.70* 4.42*  CALCIUM 7.6* 7.5*   Liver Function Tests: No results for input(s): AST, ALT, ALKPHOS, BILITOT, PROT, ALBUMIN in the last 168 hours. No results for input(s): LIPASE, AMYLASE in the last 168 hours. No results for input(s): AMMONIA in the last 168 hours. CBC: Recent Labs  Lab 04/26/20 0022  WBC 5.1  HGB 8.1*  HCT 26.0*  MCV 97.0  PLT 284   Cardiac Enzymes: No results for input(s): CKTOTAL, CKMB, CKMBINDEX, TROPONINI in the last 168 hours. BNP (last 3 results) No results for input(s): BNP in the last 8760 hours.  ProBNP (last 3 results) No results for input(s): PROBNP in the last 8760 hours.  CBG: Recent Labs  Lab  04/24/20 1211 04/24/20 1640  GLUCAP 99 103*    No results found for this or any previous visit (from the past 240 hour(s)).   Studies: No results found.  Scheduled Meds: . sodium chloride   Intravenous Once  . calcium carbonate  1 tablet Oral TID  . Chlorhexidine Gluconate Cloth  6 each Topical Daily  . cholecalciferol  1,000 Units Oral Daily  . DULoxetine  60 mg Oral Daily   And  . DULoxetine  30 mg Oral QHS  . gabapentin  200 mg Oral Daily  . gabapentin  300 mg Oral QHS  . heparin  5,000 Units Subcutaneous Q8H  . lip balm  1 application Topical BID  . multivitamin with minerals  1 tablet Oral BID  . pantoprazole  40 mg Oral QHS  . polycarbophil  625 mg Oral BID  . sodium bicarbonate  1,300 mg Oral TID  . sodium chloride flush  3 mL Intravenous Q12H   Continuous Infusions: . sodium chloride 250 mL (04/22/20 1039)    Active Problems:   CKD (chronic kidney disease) stage 5, GFR less than 15 ml/min (HCC)   Chronic pain   Protein-calorie malnutrition, severe   Pelvic abscess with colovescial fistula   Acute kidney failure ([place])   History of  COVID-19   History of gastric bypass   Hodgkin lymphoma, unspecified, unspecified site ([place])   Metabolic acidosis   Pernicious anemia   Toxic metabolic encephalopathy   PTSD (post-traumatic stress disorder)   Bilateral ureteral obstruction s/p perc nephrosotmy & stenting   Anxiety and depression   History of CVA (cerebrovascular accident)   BMI less than 19,adult   Chronic narcotic use   Anemia in chronic kidney disease   Incisional hernia - epigastric   Sepsis with acute renal failure without septic shock ([place])   DNR (do not resuscitate) discussion   Palliative care by specialist   Adult failure to thrive   Acute respiratory failure with hypoxia ([place])   Diverticulitis of sigmoid colon with abscess s/p colectomy/colostomy Jeanette Caprice) 04/18/2020   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[facility])   Personality disorder    Cachexia ([place])   Physical  deconditioning   Consultants:    Procedures:    Antibiotics: Anti-infectives (From admission, onward)   Start     Dose/Rate Route Frequency Ordered Stop   04/19/20 0200  cefoTEtan (CEFOTAN) 2 g in sodium chloride 0.9 % 100 mL IVPB        2 g 200 mL/hr over 30 Minutes Intravenous Every 12 hours 04/18/20 1824 04/19/20 0238   04/18/20 1200  cefoTEtan (CEFOTAN) 2 g in sodium chloride 0.9 % 100 mL IVPB        2 g 200 mL/hr over 30 Minutes Intravenous To ShortStay Surgical 04/17/20 2206 04/18/20 1410   04/17/20 2215  neomycin (MYCIFRADIN) tablet 1,000 mg       "And" Linked Group Details   1,000 mg Oral  Once 04/17/20 2206 04/17/20 2348   04/17/20 2215  metroNIDAZOLE (FLAGYL) tablet 1,000 mg       "And" Linked Group Details   1,000 mg Oral  Once 04/17/20 2206 04/17/20 2336   04/13/20 1145  cefoTEtan (CEFOTAN) 2 g in sodium chloride 0.9 % 100 mL IVPB        2 g 200 mL/hr over 30 Minutes Intravenous On call to O.R. 04/12/20 2006 04/14/20 0559   04/12/20 2100  neomycin (MYCIFRADIN) tablet 1,000 mg       "And" Linked Group Details   1,000 mg Oral 3 times per day 04/12/20 2006 04/13/20 0527   04/12/20 2100  metroNIDAZOLE (FLAGYL) tablet 1,000 mg       "And" Linked Group Details   1,000 mg Oral 3 times per day 04/12/20 2006 04/13/20 0527   04/03/20 1600  ceFEPIme (MAXIPIME) 1 g in sodium chloride 0.9 % 100 mL IVPB        1 g 200 mL/hr over 30 Minutes Intravenous Every 24 hours 04/03/20 0918 04/07/20 1108   04/01/20 1600  ceFEPIme (MAXIPIME) 1 g in sodium chloride 0.9 % 100 mL IVPB  Status:  Discontinued        1 g 200 mL/hr over 30 Minutes Intravenous Every 24 hours 03/31/20 1550 04/03/20 0918   04/01/20 1145  cefTRIAXone (ROCEPHIN) 1 g in sodium chloride 0.9 % 100 mL IVPB        1 g 200 mL/hr over 30 Minutes Intravenous  Once 04/01/20 1057 04/01/20 1308   03/31/20 1415  ceFEPIme (MAXIPIME) 2 g in sodium chloride 0.9 % 100 mL IVPB        2 g 200 mL/hr over 30 Minutes Intravenous   Once 03/31/20 1407 03/31/20 1516       Time spent:  20 minutes    Erin Hearing ANP  [facility] 7 am - 330 pm/M-F for direct patient care and secure chat Please refer to Amion for contact info 31  days

## 2020-05-01 NOTE — Progress Notes (Signed)
Patient ID: Chase [name]., male   DOB: 11/27/1965, 55 y.o.   MRN: [id number]  Pt noted to still be hospitalized awaiting placement.  It has now been > 10 days since CT cystogram that showed equivocal findings with contrast in the rectal stump but no clear fistula tract identified.  Due to the lack of a precontrast image, it was questionable if contrast in rectum was from another source.  Since patients remains hospitalized, will plan to order a repeat CT cystogram now before AND after bladder contrast via catheter to more accurately assess for possible closure of fistula.  This will help more clearly define plan for outpatient follow up after discharge.

## 2020-05-01 NOTE — TOC Progression Note (Signed)
Transition of Care (TOC) - Progression Note    Patient Details  Name: Chase VASCO Sr. MRN: [id number] Date of Birth: 03-16-1966  Transition of Care [facility]) CM/SW [place], RN Phone Number: 05/01/2020, 9:57 AM  Clinical Narrative:    Case management called and spoke with Lynnda Shields, RNCM with [place] regarding needed ostomy supplies to be ordered from the [facility] and shipped to the patient's home ( The [place] numbers on each item are: Pouch 18134; skin barrier 11204; barrier ring 8805. The pouch and skin barrier are of the [employer] product line of theirs.)  I called and spoke with Cyndee Brightly, [place] at [facility] regarding patient's request to transport home by [place] on Wednesday for discharge to home.  Wynetta Emery, CSW states that the [place] is unable to transport the patient home from the hospital and patient will need to have the family transport him home on Wednesday.  I called and was unable to reach the patient by phone but will follow up with the patient regarding discharge plans.  CM and CSW will continue to follow the patient for transitions of care to home - most likely on Wednesday, 05/03/2020.     Expected Discharge Plan: Skilled Nursing Facility Barriers to Discharge: No SNF bed  Expected Discharge Plan and Services Expected Discharge Plan: [place] arrangements for the past 2 months: Apartment                                       Social Determinants of Health (SDOH) Interventions    Readmission Risk Interventions Readmission Risk Prevention Plan 12/25/2019 11/19/2019  Transportation Screening Complete Complete  Medication Review Press photographer) Complete Complete  PCP or Specialist appointment within 3-5 days of discharge - Complete  HRI or [place] - Complete  SW Recovery Care/Counseling Consult - Complete  [place] - Complete  Some recent  data might be hidden

## 2020-05-02 NOTE — TOC Progression Note (Addendum)
Transition of Care (TOC) - Progression Note    Patient Details  Name: LABARRON DURNIN Sr. MRN: [id number] Date of Birth: [birth year]  Transition of Care [facility]) CM/SW Contact  Curlene Labrum, RN Phone Number: 05/02/2020, 11:21 AM  Clinical Narrative:    CM spoke with the patient at the bedside concerning transitions of care to home.  The patient is unable to be transported home by [place], according to Cyndee Brightly, MSW at [place].  I spoke with the patient and made him aware of this and he is agreeable to go home by [facility] tomorrow.  The patient states that he is able to enter his home and does not need keys to get into the house tomorrow.  The patient's mother will be arriving at the patient's home tomorrow before or soon after he arrives to home by ambulance.  The patient states that if he is able meds may be filled at [place] until he and his mom can travel to the [place], [place] this week to pick up rest of discharge medications.  The patient's mother drives and is able to drive patient to errands and MD appointments.  I sent a detailed message to [place], [place] at [place] at [facility] and they are aware of patient's impending discharge to home tomorrow.  I also gave [facility] contact for ostomy supplies in the message.  I called and spoke with Mendel Ryder, RN on 6 [address] and she is aware of patient's plans for discharge home tomorrow by [place] and nursing care planned for home.  CM and MSW will continue to follow the patient for discharge plans for home tomorrow by [facility].  05/02/2020 1450 - Called and spoke with Trudee Kuster, [place] and gave her the patient's size (Foley #16 Fr) and [facility] foley bags, catheters and legs bags shipped to his home after discharge to home for follow up care so that home health company can assist with foley care orders.   Expected Discharge Plan: [place] Barriers to Discharge: Continued Medical Work up (Plan for  discharge home tomorrow.)  Expected Discharge Plan and Services Expected Discharge Plan: [place] In-house Referral: Clinical Social Work Discharge Planning Services: CM Consult Post Acute Care Choice: [place] Living arrangements for the past 2 months: [place]                 DME Arranged: Ostomy supplies (foley bag and 2 leg bags for home at bedside to take home) DME Agency:  ([place], [place]) Date DME Agency Contacted: 05/01/20 Time DME Agency Contacted: 1000 Representative spoke with at DME Agency: Lynnda Shields, RN ostomy nurse at [place], [place] [phone] [place]: RN,Nurse's Aide,PT,OT [place]: [facility] (now [facility]) Date [place]: 05/02/20 Time [place]: 1118 Representative spoke with at [place]: [place], [place] with [facility] - aware of D/C plan and ostomy supply contact at [place] ([place]) Interventions    Readmission Risk Interventions Readmission Risk Prevention Plan 12/25/2019 11/19/2019  Transportation Screening Complete Complete  Medication Review Press photographer) Complete Complete  PCP or Specialist appointment within 3-5 days of discharge - Complete  HRI or [place] - Complete  SW Recovery Care/Counseling Consult - Complete  [place] - Complete  Some recent data might  be hidden

## 2020-05-02 NOTE — Progress Notes (Signed)
[facility] PROGRESS NOTE  Chase [name] YIF:[id number] DOB: 08-30-1965 DOA: 03/31/2020 PCP: [name]        04/14/20                            04/27/20                        04/28/20     Status: Remains inpatient appropriate because:Unsafe d/c plan and Inpatient level of care appropriate due to severity of illness   Dispo: The patient is from: Home              Anticipated d/c is to: SNF              Anticipated d/c date is: > 3 days              Patient currently is medically stable to d/c. Barriers to discharge: Awaiting results of CT cystogram; pt's mother unable to pick pt up until 05/03/2020  Code Status: Full code Family Communication: Patient daily; at length with son Chase [name] on 1/19. DVT prophylaxis: SQ heparin Vaccination status: Did receive 1 dose of the Moderna COVID-vaccine.  Subsequently refusing any future doses or even a booster shot.  States he had COVID 3 months ago and has reduced immune system and does not feel these immunizations are effective for him.  Foley catheter: Presented with chronic Foley catheter  HPI: HPI on 03/31/2020 by Dr. 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 Patient was reportedly seen by neighbors yesterday in a recliner and was then found on the floor today obtunded. He was sent to [facility] emergency department and is unable to provide history due to his clinical  condition.  Interim history Admitted with acute renal failure on chronic kidney disease, stage V secondary to urinary obstruction and dehydration with electrolyte abnormalities.  Nephrology consulted.  Patient did undergo hemodialysis.  IR placed bilateral percutaneous nephrostomy tubes.  Creatinine did improve with a couple cycles of hemodialysis however plateaued.  Hemodialysis catheter was then removed.  No further plans for hemodialysis at this time.  Nephrology feels that patient can be discharged home with outpatient follow-up.  Since admission patient has undergone dual procedure that included diverting loop colostomy and replacement of internal ureteral stents.  Urine output and renal function has remained stable therefore urology has discontinued nephrostomy tubes.  Patient's affect and appetite have improved post diverting colostomy.  PT and OT continue to recommend SNF placement for severe deconditioning.   Subjective: Alert.  Confirms his mother will not be able to come pick him up until Wednesday.  States physically has to present to [place] in [place] to pick up his prescriptions- no complaints verbalized.  Objective: Vitals:   05/01/20 2021 05/02/20 0437  BP: 118/69 111/60  Pulse: 67 71  Resp: 18 18  Temp: 97.9 F (36.6 C) 98 F (36.7 C)  SpO2: 100% 98%    Intake/Output Summary (Last 24 hours) at 05/02/2020 [phone]  Last data filed at 05/02/2020 0900 Gross per 24 hour  Intake 1020 ml  Output 3125 ml  Net -2105 ml   Filed Weights   04/20/20 0500 04/24/20 0500 04/25/20 0526  Weight: 55.6 kg 59.2 kg 58.4 kg    Exam: General: Alert, calm, much less cachectic in appearance Respiratory: Bilateral lung sounds clear to auscultation, no increased work of breathing, stable on room air Cardiovascular: Normal heart sounds S1-S2, no tachycardia, extremities warm to touch Abdomen: 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with stool noted in collection bag, bowel sounds present.  Minimally tender over  incision sites.  LBM 1/31 Genitourinary: Chronic Foley catheter  Neurologic: CN 2-12 grossly intact. Sensation intact, DTR normal. Strength 5/5 x all 4 extremities.  Psychiatric: Normal judgment and insight. Alert and oriented x 3.  Flat mood and affect.    Assessment/Plan: Acute problems: Acute renal failure on chronic kidney disease, stage V -Nephrology consulted and appreciated -Creatinine 5.76 in October 2021, although patient presented with a creatinine of 16.69 -Chronic kidney disease 2/2 longstanding obstruction.  Acute kidney injury due to dehydration and sepsis along with ATN with obstruction. -Interventional radiology consulted for bilateral percutaneous nephrostomy tube placement, 04/01/2020 as well as temporary HD catheter placement. -Patient did undergo hemodialysis, 2 cycles with last hemodialysis on 04/04/2019. -Creatinine has now plateaued, currently 5.08 -Though creatinine is still elevated, it is below his baseline.  Patient himself is unsure about wanting continuous HD.   -Nephrology recommended that patient follow-up with nephrology as an outpatient.   -Recommended continuing bicarb on discharge.  Recurrent orthostasis -Likely from Flomax-has chronic Foley catheter therefore do not see any harm at this juncture w/ discontinuing Flomax -General surgery has ordered cosyntropin stimulation test which is normal -Continues to have episodes of orthostasis which are improving.  Hopeful in the outpatient setting can resume alpha blockers to treat PTSD and urinary symptoms  Bilateral hydronephrosis s/p cystoscopy,B ureteral stent placement w/ B retrograde pyelogram/bladder outlet obstruction -Per IR follow-up on 04/07/2019, patient had urinary obstruction secondary to pelvic mass with subsequent bilateral hydronephrosis in the setting of bilateral ureteral stents, bilateral PCNs inadvertently removed (unknown how long they have been out), status post bilateral PCN placement on  04/01/2020. -1/18 Dr Alinda Money performed concurrent procedure with surgical team (cystoscopy and bilateral ureteral stent change)  -Nephrostomy tubes removed under fluoroscopy on 1/25 -Presented w/ chronic Foley catheter as well. -Continue Foley until can tolerate alpha-blocker-need to delay consideration for discontinuation of Foley until outpatient setting -1/31 Dr. Alinda Money with urology plans cystogram today re is regarding contrast noted and rectal stump  History of pelvic abscess/known colovesical fistula s/p lap sigmoid colectomy w/epigastric hernia repair (1/18) -Noted on 11/15/2019, status post left transgluteal drain by IR/colovesicular fistula -1/18 underwent diverting loop colostomy by [name] in conjunction with urological procedure by Dr. Alinda Money as described above -Primary GI is Dr. Rush Landmark.  -Participating with self care regarding colostomy-states assisted with changing of bag today  Goals of care -Palliative care consulted and appreciated -Patient should follow up with palliative care as an outpatient-patient has previously stated that he wishes to discuss this with his sons and family members before making a final decision.  Severe physical deconditioning, history of prior CVA -July 2021, history of atrial fibrillation, patient is not a candidate for anticoagulation due to GI bleed and thrombocytopenia -Currently no focal deficits  Severe protein calorie malnutrition Nutrition Problem: Severe Malnutrition Etiology: chronic illness (colovesical fistula) Signs/Symptoms: moderate fat depletion,severe fat depletion,moderate muscle depletion,severe muscle depletion Interventions: MVI,Refer to RD  note for recommendations Estimated body mass index is 20.78 kg/m as calculated from the following:   Height as of this encounter: 5\' 6"  (1.676 m).   Weight as of this encounter: 58.4 kg. -Postoperatively patient is reporting increased appetite.  Continue to follow intake.  May require  calorie count  Chronic pain -Pt was taking a massive dose of Neurontin PTA (800 mg TID)-with his low GFR will give 200 mg bid 100 mg at HS  -Continue low-dose Zanaflex   Other problems: Severe dehydration with multiple metabolic derangements as follows: Severe metabolic acidosis, hyperkalemia, hyperphosphatemia -Resolved after rehydration, dialysis and Kayexalate  Severe sepsis secondary to urinary tract infection -Present on admission-sepsis physiology has now resolved -Patient presented with encephalopathy as well as hypothermia and leukocytosis -Blood cultures were negative x2 -Urine culture from 12/31 and 1/1 showed multiple species -CT renal stone study 12/31: Bladder wall appeared slightly thickened.  No significant fluid collection in pelvis -Patient completed 7 days of IV cefepime  PTSD -1/24 will resume Vistaril 10 mg 3 times daily as needed and tizanidine 2 mg 3 times daily as needed for muscle spasms -Continue preadmission Cymbalta twice daily  History of gastric bypass  -Avoid NSAIDs (unable to use currently in context of chronic kidney disease)  Data Reviewed: Basic Metabolic Panel: Recent Labs  Lab 04/26/20 0022  NA 137  K 3.5  CL 107  CO2 22  GLUCOSE 159*  BUN 34*  CREATININE 4.42*  CALCIUM 7.5*   Liver Function Tests: No results for input(s): AST, ALT, ALKPHOS, BILITOT, PROT, ALBUMIN in the last 168 hours. No results for input(s): LIPASE, AMYLASE in the last 168 hours. No results for input(s): AMMONIA in the last 168 hours. CBC: Recent Labs  Lab 04/26/20 0022  WBC 5.1  HGB 8.1*  HCT 26.0*  MCV 97.0  PLT 284   Cardiac Enzymes: No results for input(s): CKTOTAL, CKMB, CKMBINDEX, TROPONINI in the last 168 hours. BNP (last 3 results) No results for input(s): BNP in the last 8760 hours.  ProBNP (last 3 results) No results for input(s): PROBNP in the last 8760 hours.  CBG: No results for input(s): GLUCAP in the last 168 hours.  No results  found for this or any previous visit (from the past 240 hour(s)).   Studies: CT CYSTOGRAM PELVIS  Result Date: 05/01/2020 CLINICAL DATA:  Evaluate for colovesical fistula EXAM: CT CYSTOGRAM (CT PELVIS WITH CONTRAST) TECHNIQUE: Multidetector CT imaging through the pelvis was performed after dilute contrast had been introduced into the bladder for the purposes of performing CT cystography. CONTRAST:  48mL OMNIPAQUE IOHEXOL 300 MG/ML  SOLN COMPARISON:  04/20/2020 FINDINGS: Urinary Tract: Bilateral nephroureteral stents are identified. A Foley catheter balloon is in place. There is a small amount of gas within the nondependent portion of the urinary bladder. Retrograde opacification of the urinary bladder was achieved with 50 cc of dilute Omnipaque 300 via indwelling Foley catheter. Reflux of contrast within both ureters noted. Opacification of several small posterior and right lateral intramural bladder diverticula noted. No extravasation of the contrast material identified in no fistulous communication with the surrounding bowel loops including the rectum. Bowel: Left lower quadrant loop colostomy. Retained contrast material within the excluded rectum (Hartmann's pouch) was noted on the precontrast images. The volume of high attenuation material within the rectum is unchanged between the pre and postcontrast images. Vascular/Lymphatic: Aortic atherosclerosis. No abdominopelvic adenopathy. Reproductive:  Prostate gland enlargement. Other:  No free fluid or fluid collections identified. Musculoskeletal: No suspicious bone  lesions identified. IMPRESSION: 1. No evidence for colovesical fistula. 2. Bilateral nephroureteral stents are identified. 3. Prostate gland enlargement. 4. Aortic atherosclerosis. Aortic Atherosclerosis (ICD10-I70.0). Electronically Signed   By: Kerby Moors M.D.   On: 05/01/2020 10:47    Scheduled Meds: . sodium chloride   Intravenous Once  . calcium carbonate  1 tablet Oral TID  .  Chlorhexidine Gluconate Cloth  6 each Topical Daily  . cholecalciferol  1,000 Units Oral Daily  . DULoxetine  60 mg Oral Daily   And  . DULoxetine  30 mg Oral QHS  . gabapentin  200 mg Oral Daily  . gabapentin  300 mg Oral QHS  . heparin  5,000 Units Subcutaneous Q8H  . lip balm  1 application Topical BID  . multivitamin with minerals  1 tablet Oral BID  . pantoprazole  40 mg Oral QHS  . polycarbophil  625 mg Oral BID  . sodium bicarbonate  1,300 mg Oral TID  . sodium chloride flush  3 mL Intravenous Q12H   Continuous Infusions: . sodium chloride 250 mL (04/22/20 1039)    Active Problems:   CKD (chronic kidney disease) stage 5, GFR less than 15 ml/min (HCC)   Chronic pain   Protein-calorie malnutrition, severe   Pelvic abscess with colovescial fistula   Acute kidney failure ([place])   History of COVID-19   History of gastric bypass   Hodgkin lymphoma, unspecified, unspecified site (HCC)   Metabolic acidosis   Pernicious anemia   Toxic metabolic encephalopathy   PTSD (post-traumatic stress disorder)   Bilateral ureteral obstruction s/p perc nephrosotmy & stenting   Anxiety and depression   History of CVA (cerebrovascular accident)   BMI less than 19,adult   Chronic narcotic use   Anemia in chronic kidney disease   Incisional hernia - epigastric   Sepsis with acute renal failure without septic shock ([place])   DNR (do not resuscitate) discussion   Palliative care by specialist   Adult failure to thrive   Acute respiratory failure with hypoxia ([place])   Diverticulitis of sigmoid colon with abscess s/p colectomy/colostomy Jeanette Caprice) 04/18/2020   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[facility])   Personality disorder    Cachexia ([place])   Physical deconditioning   Consultants:    Procedures:    Antibiotics: Anti-infectives (From admission, onward)   Start     Dose/Rate Route Frequency Ordered Stop   04/19/20 0200  cefoTEtan (CEFOTAN) 2 g in sodium chloride 0.9 % 100 mL IVPB        2 g 200  mL/hr over 30 Minutes Intravenous Every 12 hours 04/18/20 1824 04/19/20 0238   04/18/20 1200  cefoTEtan (CEFOTAN) 2 g in sodium chloride 0.9 % 100 mL IVPB        2 g 200 mL/hr over 30 Minutes Intravenous To ShortStay Surgical 04/17/20 2206 04/18/20 1410   04/17/20 2215  neomycin (MYCIFRADIN) tablet 1,000 mg       "And" Linked Group Details   1,000 mg Oral  Once 04/17/20 2206 04/17/20 2348   04/17/20 2215  metroNIDAZOLE (FLAGYL) tablet 1,000 mg       "And" Linked Group Details   1,000 mg Oral  Once 04/17/20 2206 04/17/20 2336   04/13/20 1145  cefoTEtan (CEFOTAN) 2 g in sodium chloride 0.9 % 100 mL IVPB        2 g 200 mL/hr over 30 Minutes Intravenous On call to O.R. 04/12/20 2006 04/14/20 0559   04/12/20 2100  neomycin (MYCIFRADIN) tablet 1,000 mg       "  And" Linked Group Details   1,000 mg Oral 3 times per day 04/12/20 2006 04/13/20 0527   04/12/20 2100  metroNIDAZOLE (FLAGYL) tablet 1,000 mg       "And" Linked Group Details   1,000 mg Oral 3 times per day 04/12/20 2006 04/13/20 0527   04/03/20 1600  ceFEPIme (MAXIPIME) 1 g in sodium chloride 0.9 % 100 mL IVPB        1 g 200 mL/hr over 30 Minutes Intravenous Every 24 hours 04/03/20 0918 04/07/20 1108   04/01/20 1600  ceFEPIme (MAXIPIME) 1 g in sodium chloride 0.9 % 100 mL IVPB  Status:  Discontinued        1 g 200 mL/hr over 30 Minutes Intravenous Every 24 hours 03/31/20 1550 04/03/20 0918   04/01/20 1145  cefTRIAXone (ROCEPHIN) 1 g in sodium chloride 0.9 % 100 mL IVPB        1 g 200 mL/hr over 30 Minutes Intravenous  Once 04/01/20 1057 04/01/20 1308   03/31/20 1415  ceFEPIme (MAXIPIME) 2 g in sodium chloride 0.9 % 100 mL IVPB        2 g 200 mL/hr over 30 Minutes Intravenous  Once 03/31/20 1407 03/31/20 1516       Time spent:  20 minutes    Charlynne Cousins ANP  [facility] 7 am - 330 pm/M-F for direct patient care and secure chat Please refer to Amion for contact info 32  days

## 2020-05-02 NOTE — Progress Notes (Signed)
Physical Therapy Treatment Patient Details Name: Chase HODSDON Sr. MRN: [id number] DOB: 11/24/1965 Today's Date: 05/02/2020    History of Present Illness 55 year old male with PMH of Hodgkin's lymphoma s/p chemotherapy 2011-2012, gastric bypass in 8413 complicated by anastomotic ulcer in 2014, colovesical fistula with abscess s/p PC drain, stage IV CKD, bilateral ureteral stenosis with ureteral stents and bilateral percutaneous nephrostomy tubes, history of CVA in 2021 (residual L sided weakness). He was admitted for acute renal failure complicating stage V CKD secondary to urinary obstruction and dehydration with severe electrolyte derangements.  Urology was consulted.  Patient underwent hemodialysis. s/p ureteral stent placement, retrograde pyelogram, lap sigmoid colectomy with colostomy, and epigastric hernia repair on 1/18. B PCN tubes removed 1/25.    PT Comments    Pt making great progress this session.  Tolerated LE exercises and progression of gt in halls.  Pt to return home tomorrow and excited to do so.  Will update recommendations to HHPT at this time based on his progress.     Follow Up Recommendations  Home health PT     Equipment Recommendations  3in1 (PT);Other (comment) (rollator if he doesnt already have one.)    Recommendations for Other Services       Precautions / Restrictions Precautions Precautions: Fall Precaution Comments: colostomy, long term foley Restrictions Weight Bearing Restrictions: No    Mobility  Bed Mobility Overal bed mobility: Modified Independent Bed Mobility: Supine to Sit Rolling: Modified independent (Device/Increase time)            Transfers Overall transfer level: Needs assistance Equipment used: Rolling walker (2 wheeled) Transfers: Sit to/from Stand Sit to Stand: Supervision         General transfer comment: Cues for hand placement to and from seated surface.  Ambulation/Gait Ambulation/Gait assistance: Min guard Gait  Distance (Feet): 110 Feet Assistive device: Rolling walker (2 wheeled) Gait Pattern/deviations: Trunk flexed;Step-through pattern Gait velocity: decr   General Gait Details: Cues for upper trunk control and forward gaze while maintaining safe position in RW.  Pt reports he uses rollator at baseline which can attribute to the position he maintains in a RW.   Stairs             Wheelchair Mobility    Modified Rankin (Stroke Patients Only)       Balance Overall balance assessment: Needs assistance Sitting-balance support: No upper extremity supported Sitting balance-Leahy Scale: Good       Standing balance-Leahy Scale: Poor                              Cognition Arousal/Alertness: Awake/alert Behavior During Therapy: WFL for tasks assessed/performed Overall Cognitive Status: Within Functional Limits for tasks assessed                                        Exercises General Exercises - Lower Extremity Long Arc Quad: AROM;Both;10 reps;Seated Hip Flexion/Marching: AROM;Both;10 reps;Seated Other Exercises Other Exercises: scap retraction x 10 reps in sitting.    General Comments        Pertinent Vitals/Pain Pain Assessment: Faces Faces Pain Scale: Hurts a little bit Pain Location: back Pain Descriptors / Indicators: Discomfort;Sore Pain Intervention(s): Monitored during session;Repositioned    Home Living  Prior Function            PT Goals (current goals can now be found in the care plan section) Acute Rehab PT Goals Patient Stated Goal: go home Potential to Achieve Goals: Fair Progress towards PT goals: Progressing toward goals    Frequency    Min 3X/week      PT Plan Discharge plan needs to be updated    Co-evaluation              AM-PAC PT "6 Clicks" Mobility   Outcome Measure  Help needed turning from your back to your side while in a flat bed without using bedrails?:  None Help needed moving from lying on your back to sitting on the side of a flat bed without using bedrails?: None Help needed moving to and from a bed to a chair (including a wheelchair)?: A Little Help needed standing up from a chair using your arms (e.g., wheelchair or bedside chair)?: A Little Help needed to walk in hospital room?: A Little Help needed climbing 3-5 steps with a railing? : A Little 6 Click Score: 20    End of Session Equipment Utilized During Treatment: Gait belt Activity Tolerance: Patient tolerated treatment well Patient left: in bed;with call bell/phone within reach Nurse Communication: Mobility status PT Visit Diagnosis: Unsteadiness on feet (R26.81);Difficulty in walking, not elsewhere classified (R26.2);Muscle weakness (generalized) (M62.81)     Time: 1250-1315 PT Time Calculation (min) (ACUTE ONLY): 25 min  Charges:  $Gait Training: 8-22 mins $Therapeutic Exercise: 8-22 mins                     Erasmo Leventhal , PTA Acute Rehabilitation Services Pager [phone] Office [phone]     Dezaree Tracey Eli Hose  05/02/2020, 2:04 PM

## 2020-05-03 ENCOUNTER — Other Ambulatory Visit (HOSPITAL_COMMUNITY): Payer: Self-pay | Admitting: Nurse Practitioner

## 2020-05-03 DIAGNOSIS — F32A Depression, unspecified: Secondary | ICD-10-CM

## 2020-05-03 DIAGNOSIS — Z8616 Personal history of COVID-19: Secondary | ICD-10-CM

## 2020-05-03 DIAGNOSIS — Z681 Body mass index (BMI) 19 or less, adult: Secondary | ICD-10-CM

## 2020-05-03 DIAGNOSIS — F419 Anxiety disorder, unspecified: Secondary | ICD-10-CM

## 2020-05-03 MED ORDER — SODIUM BICARBONATE 650 MG PO TABS: 650.0000 mg | ORAL_TABLET | Freq: Two times a day (BID) | ORAL | 0 refills | Status: DC

## 2020-05-03 MED ORDER — DULOXETINE HCL 30 MG PO CPEP: 30.0000 mg | ORAL_CAPSULE | ORAL | 0 refills | Status: DC

## 2020-05-03 MED ORDER — VITAMIN D3 20 MCG (800 UNIT) PO TABS: 800.0000 [IU] | ORAL_TABLET | Freq: Every day | ORAL | 0 refills | Status: DC

## 2020-05-03 MED ORDER — PANTOPRAZOLE SODIUM 20 MG PO TBEC: 20.0000 mg | DELAYED_RELEASE_TABLET | Freq: Two times a day (BID) | ORAL | 0 refills | Status: DC

## 2020-05-03 MED ORDER — HYDROXYZINE HCL 10 MG PO TABS: 10.0000 mg | ORAL_TABLET | Freq: Three times a day (TID) | ORAL | 0 refills | Status: DC | PRN

## 2020-05-03 MED ORDER — GABAPENTIN 100 MG PO CAPS: 200.0000 mg | ORAL_CAPSULE | Freq: Every day | ORAL | 0 refills | Status: DC

## 2020-05-03 MED ORDER — GABAPENTIN 300 MG PO CAPS: 300.0000 mg | ORAL_CAPSULE | Freq: Every day | ORAL | 3 refills | Status: DC

## 2020-05-03 MED ORDER — OXYCODONE HCL 5 MG PO TABS: 5.0000 mg | ORAL_TABLET | ORAL | 0 refills | Status: DC | PRN

## 2020-05-03 MED ORDER — TIZANIDINE HCL 2 MG PO TABS: 2.0000 mg | ORAL_TABLET | Freq: Three times a day (TID) | ORAL | 0 refills | Status: DC | PRN

## 2020-05-03 MED ORDER — ADULT MULTIVITAMIN W/MINERALS CH: 1.0000 | ORAL_TABLET | Freq: Every day | ORAL | 0 refills | Status: AC

## 2020-05-03 MED FILL — PANTOPRAZOLE SOD DR 20 MG T: 20 | 7 days supply | Qty: 15 | Fill #0

## 2020-05-03 MED FILL — SODIUM BICARBONATE 650 MG T: 650 | 15 days supply | Qty: 30 | Fill #0

## 2020-05-03 MED FILL — DULoxetine HCL 30 MG CPEP: 30 | 5 days supply | Qty: 14 | Fill #0

## 2020-05-03 MED FILL — GABAPENTIN 300 MG CAPSULE: 300 | 14 days supply | Qty: 14 | Fill #0

## 2020-05-03 MED FILL — GABAPENTIN 100 MG CAPSULE: 100 | 14 days supply | Qty: 28 | Fill #0

## 2020-05-03 MED FILL — tiZANidine HCL 2 MG TABS: 2 | 5 days supply | Qty: 15 | Fill #0

## 2020-05-03 MED FILL — oxyCODONE HCL 5 MG TABS: 5 | 2 days supply | Qty: 14 | Fill #0

## 2020-05-03 NOTE — Progress Notes (Addendum)
CSW arranged for pickup via [facility] at 1pm to discharge home today. DTP NP placed prescription orders to [facility] - patient to receive medications prior to discharge.  Patient and RN aware of discharge plan.  Madilyn Fireman, MSW, LCSW-A Transitions of Care  Clinical Social Worker I [phone]

## 2020-05-03 NOTE — Progress Notes (Signed)
Physical Therapy Treatment Patient Details Name: Chase LINDEN Sr. 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 He was admitted for acute renal failure complicating stage V CKD secondary to urinary obstruction and dehydration with severe electrolyte derangements.  Urology was consulted.  Patient underwent hemodialysis. s/p ureteral stent placement, retrograde pyelogram, lap sigmoid colectomy with colostomy, and epigastric hernia repair on 1/18. B PCN tubes removed 1/25.    PT Comments    Patient has made substantial progress during admission. Patient ambulating 400' with RW and supervision this session. Patient performed seated therex prior to standing. Patient highly motivated to return to PLOF and happy to return home today. Patient continues to be limited by generalized weakness, decreased activity tolerance, and impaired balance. Recommend HHPT following discharge to maximize functional independence and return to PLOF.     Follow Up Recommendations  Home health PT     Equipment Recommendations  None recommended by PT (pt owns rollator already)    Recommendations for Other Services       Precautions / Restrictions Precautions Precautions: Fall Precaution Comments: colostomy, long term foley Restrictions Weight Bearing Restrictions: No    Mobility  Bed Mobility Overal bed mobility: Modified Independent                Transfers Overall transfer level: Needs assistance Equipment used: Rolling Kamden Reber (2 wheeled) Transfers: Sit to/from Stand Sit to Stand: Supervision         General  transfer comment: good carryover for hand placement from previous session  Ambulation/Gait Ambulation/Gait assistance: Supervision Gait Distance (Feet): 400 Feet Assistive device: Rolling Aundrey Elahi (2 wheeled) Gait Pattern/deviations: Trunk flexed;Step-through pattern     General Gait Details: cues for upright posture and forward gaze. Discussed with patient about energy conservation in the home with mobility tasks and use of rollator to allow for seated rest break when needed   Stairs             Wheelchair Mobility    Modified Rankin (Stroke Patients Only)       Balance Overall balance assessment: Needs assistance Sitting-balance support: No upper extremity supported Sitting balance-Leahy Scale: Good     Standing balance support: Bilateral upper extremity supported Standing balance-Leahy Scale: Poor Standing balance comment: reliant on RW                            Cognition Arousal/Alertness: Awake/alert Behavior During Therapy: WFL for tasks assessed/performed Overall Cognitive Status: Within Functional Limits for tasks assessed                                        Exercises General Exercises - Upper Extremity Shoulder Flexion: AROM;Both;10 reps Shoulder ABduction: AROM;Both;10 reps Elbow Flexion: AROM;Both;10 reps General Exercises - Lower Extremity Long Arc Quad: AROM;Both;10 reps Hip Flexion/Marching: Both;10 reps;Seated;Standing Toe Raises: AROM;Both;10 reps Heel Raises: AROM;Both;10 reps    General Comments        Pertinent Vitals/Pain Pain Assessment: Faces Pain Score: 0-No pain Faces Pain Scale: Hurts little more Pain Location: back  Pain Descriptors / Indicators: Discomfort;Sore Pain Intervention(s): Premedicated before session    Home Living                      Prior Function            PT Goals (current goals can now be found in the care plan section) Acute Rehab PT Goals Patient Stated Goal:  go home PT Goal Formulation: With patient Time For Goal Achievement: 05/05/20 Potential to Achieve Goals: Fair Progress towards PT goals: Progressing toward goals    Frequency    Min 3X/week      PT Plan Current plan remains appropriate    Co-evaluation              AM-PAC PT "6 Clicks" Mobility   Outcome Measure  Help needed turning from your back to your side while in a flat bed without using bedrails?: None Help needed moving from lying on your back to sitting on the side of a flat bed without using bedrails?: None Help needed moving to and from a bed to a chair (including a wheelchair)?: A Little Help needed standing up from a chair using your arms (e.g., wheelchair or bedside chair)?: A Little Help needed to walk in hospital room?: A Little Help needed climbing 3-5 steps with a railing? : A Little 6 Click Score: 20    End of Session Equipment Utilized During Treatment: Gait belt Activity Tolerance: Patient tolerated treatment well Patient left: in chair;with call bell/phone within reach Nurse Communication: Mobility status PT Visit Diagnosis: Unsteadiness on feet (R26.81);Difficulty in walking, not elsewhere classified (R26.2);Muscle weakness (generalized) (M62.81)     Time: 1657-9038 PT Time Calculation (min) (ACUTE ONLY): 33 min  Charges:  $Therapeutic Exercise: 8-22 mins $Therapeutic Activity: 8-22 mins                     Noreta Kue A. Gilford Rile PT, DPT Acute Rehabilitation Services Pager [phone] Office [phone]    Alda Lea 05/03/2020, 11:31 AM

## 2020-05-03 NOTE — TOC Transition Note (Signed)
Transition of Care [facility]) - CM/SW Discharge Note   Patient Details  Name: Chase AVEY Sr. MRN: [id number] Date of Birth: July 04, 1965  [facility] CM/SW Contact:  Curlene Labrum, RN Phone Number: 05/03/2020, 12:36 PM   Clinical Narrative:    Case management met with the patient at the bedside to discuss discharging plans for home today.  The primary RN was at the patient's bedside giving discharge instructions to the patient.  The patient's discharge TOC medications were present in the room along with colostomy supplies and foley supplies for home.  The patient states that his mother is aware of discharge to home today and plans to arrive at the patient's home today to help care for him as needed.  The patient is set up at the [facility] for colostomy and foley, foley supplies to be delivered to the home through the [place] case managers.    Patient is set up appropriately for hospital follow up and is currently waiting on PTAR arrival for transport home.  [facility] is aware of patient's discharge to home today.  PTAR packet was organized and placed at the bedside for patient's transport to home today.   Final next level of care: [place] Barriers to Discharge: Continued Medical Work up (Plan for discharge home tomorrow.)   Patient Goals and CMS Choice Patient states their goals for this hospitalization and ongoing recovery are:: Patient wants to go home with mother. CMS Medicare.gov Compare Post Acute Care list provided to:: Patient Choice offered to / list presented to : Patient  Discharge Placement                       Discharge Plan and Services In-house Referral: Clinical Social Work Discharge Planning Services: CM Consult Post Acute Care Choice: Durable Medical Union          DME Arranged: Ostomy supplies (foley bag and 2 leg bags for home at bedside to take home) DME Agency:  ([place],  [place]) Date DME Agency Contacted: 05/01/20 Time DME Agency Contacted: 1000 Representative spoke with at DME Agency: Lynnda Shields, RN ostomy nurse at [place], [place] [phone] [place]: RN,Nurse's Aide,PT,OT [place]: [facility] (now [facility]) Date [place]: 05/02/20 Time [place]: 1118 Representative spoke with at [place]: [place], [place] with [facility] - aware of D/C plan and ostomy supply contact at [place] ([place]) Interventions     Readmission Risk Interventions Readmission Risk Prevention Plan 12/25/2019 11/19/2019  Transportation Screening Complete Complete  Medication Review Press photographer) Complete Complete  PCP or Specialist appointment within 3-5 days of discharge - Complete  HRI or [place] - Complete  SW Recovery Care/Counseling Consult - [place] - Complete  Some recent data might be hidden

## 2020-05-03 NOTE — Discharge Summary (Signed)
Physician Discharge Summary  Chase [name] ZLD:[id number] DOB: 19-Jul-1965 DOA: 03/31/2020  PCP: [name]  Admit date: 03/31/2020 Discharge date: 05/03/2020  Time spent: 45 minutes   Recommendations for Outpatient Follow-up:  1. Patient needs to follow-up with urologist Dr. Anselm Lis as recommended. 2. Patient also needs to follow-up with [name] of general surgery after discharge 3. It is also recommended that patient follow-up with a nephrologist after discharge-he has previously been evaluated by Dr. Jonnie Finner 4. Patient will have home health services by [facility] 5. 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   Anxiety and depression   History of CVA (cerebrovascular accident)   BMI less than 19,adult   Chronic narcotic use   Anemia in chronic kidney disease   Incisional hernia - epigastric   Sepsis with acute renal failure without septic shock ([place])   DNR (do not resuscitate) discussion   Palliative care by specialist   Adult failure to thrive   Acute respiratory failure with hypoxia ([place])   Diverticulitis of sigmoid colon with abscess s/p colectomy/colostomy Jeanette Caprice) 04/18/2020   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[facility])   Personality disorder    Cachexia [facility])   Physical deconditioning   Discharge Condition: Stable  Diet recommendation: Regular  Filed Weights   04/20/20 0500 04/24/20 0500  04/25/20 0526  Weight: 55.6 kg 59.2 kg 58.4 kg    History of present illness:  55 y.o.malewith medical history significant forhistory of Hodgkin lymphoma treated with chemotherapy and radiation in 2010-2011, gastric bypass in 1996 for obesity with bleeding anastomotic ulcer in 2014, 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 Patient was reportedly seen by neighbors yesterday in a recliner and was then found on the floor today obtunded. He was sent to [facility] emergency department and is unable to provide history due to his clinical condition.  Interim history Admitted with acute renal failure on chronic kidney disease, stage V secondary to urinary obstruction and dehydration with electrolyte abnormalities. Nephrology consulted. Patient did undergo hemodialysis. IR placed bilateral percutaneous nephrostomy tubes. Creatinine did improve with a couple cycles of hemodialysis however plateaued. Hemodialysis catheter was then removed. No further plans for hemodialysis at this time. Nephrology feels that patient can be discharged home with outpatient follow-up.  Since admission patient has undergone dual procedure that included diverting loop colostomy and replacement of internal ureteral stents.  Urine output and renal function has remained stable therefore urology has discontinued nephrostomy tubes.  Patient's affect and appetite have improved post diverting colostomy.  PT and OT continue to recommend SNF placement for severe deconditioning.   Hospital Course:  Acute problems: Acute renal failure on chronic kidney disease, stage V -Nephrology consulted and  appreciated -Creatinine 5.76 in  October 2021, although patient presented with a creatinine of 16.69 -Chronic kidney disease 2/2 longstanding obstruction. Acute kidney injury due to dehydration and sepsis along with ATN with obstruction. -Interventional radiology consulted for bilateral percutaneous nephrostomy tube placement, 04/01/2020 as well as temporary HD catheter placement. -Patient did undergo hemodialysis, 2 cycles with last hemodialysis on 57 -Creatinine has now plateaued, currently 5.08 -Nephrology recommended that patient follow-up with nephrology as an outpatient.  -Continue oral sodium bicarbonate as prior to admission  Recurrent orthostasis -Likely from Flomax-has chronic Foley catheter therefore do not see any harm at this juncture w/ discontinuing Flomax -Cosyntropin stimulation test normal -Continues to have episodes of orthostasis which are improving.  Hopeful in the outpatient setting can resume alpha blockers to treat PTSD and urinary symptoms  Bilateral hydronephrosis s/p cystoscopy,B ureteral stent placement w/ B retrograde pyelogram/bladder outlet obstruction -Per IR follow-up on 57, patient had urinary obstruction secondary to pelvic mass with subsequent bilateral hydronephrosis in the setting of bilateral ureteral stents, bilateral PCNs inadvertently removed (unknown how long they have been out), status post bilateral PCN placement on 04/01/2020. -1/18 Dr Alinda Money performed concurrent procedure with surgical team (cystoscopy and bilateral ureteral stent change)  -Nephrostomy tubes removed under fluoroscopy on 1/25 -Presented w/ chronic Foley catheter as well. -Continue Foley until can tolerate alpha-blocker-need to delay consideration for discontinuation of Foley until outpatient setting -1/31 follow-up CT cystogram shows no evidence of colovesical fistula  History of pelvic abscess/known colovesical fistula s/p lap sigmoid colectomy w/epigastric hernia repair (1/18) -Noted on 11/15/2019,  status post left transgluteal drain by IR/colovesicular fistula -1/18 underwent diverting loop colostomy by [name] in conjunction with urological procedure by Dr. Alinda Money as described above -Primary GI is Dr. Rush Landmark.  -Participating with self care regarding colostomy  Goals of care -Palliative care consulted and appreciated -Patient should follow up with palliative care as an outpatient-patient has previously stated that he wishes to discuss this with his sons and family members before making a final decision.  Severe physical deconditioning, history of prior CVA -July 2021, history of atrial fibrillation, patient is not a candidate for anticoagulation due to GI bleed and thrombocytopenia -Currently no focal deficits  Severe protein calorie malnutrition Nutrition Problem: Severe Malnutrition Etiology: chronic illness (colovesical fistula) Signs/Symptoms: moderate fat depletion,severe fat depletion,moderate muscle depletion,severe muscle depletion Interventions: MVI,Refer to RD note for recommendations Estimated body mass index is 20.78 kg/m as calculated from the following:   Height as of this encounter: 5\' 6"  (1.676 m).   Weight as of this encounter: 58.4 kg. -Postoperatively patient is reporting increased appetite.  Continue to follow intake.  May require calorie count  Chronic pain -Pt was taking a massive dose of Neurontin PTA (800 mg TID)-with his low GFR will give 200 mg bid 100 mg at HS       -Continue low-dose Zanaflex   Other problems: Severe dehydration with multiple metabolic derangements as follows: Severe metabolic acidosis, hyperkalemia, hyperphosphatemia -Resolved after rehydration, dialysis and Kayexalate  Severe sepsis secondary to urinary tract infection -Present on admission-sepsis physiology has now resolved -Patient presented with encephalopathy as well as hypothermia and leukocytosis -Blood cultures were negative x2 -Urine culture from 12/31 and  1/1 showed multiple species -CT renal stone study 12/31: Bladder wall appeared slightly thickened. No significant fluid collection in pelvis -Patient completed 7 days of IV cefepime  PTSD -1/24 will resume Vistaril 10 mg 3 times daily as needed and tizanidine 2 mg 3 times daily as needed  for muscle spasms -Continue preadmission Cymbalta twice daily  History of gastric bypass  -Avoid NSAIDs (unable to use currently in context of chronic kidney disease)  Procedures:  Hemodialysis  Flexible sigmoidoscopy  Colonoscopy  Laparoscopic sigmoid colotomy with colostomy, diascopy with retrograde pyelogram and ureteral stent placement  Percutaneous nephrostomy tube placement with subsequent removal  Consultations:  Urology  General surgery  Discharge Exam: Vitals:   05/02/20 2042 05/03/20 0413  BP: 120/68 131/79  Pulse: 65 (!) 56  Resp: 19 18  Temp: 98 F (36.7 C) 98.1 F (36.7 C)  SpO2: 100% 99%   General: Alert, calm, much less cachectic in appearance Respiratory: Bilateral lung sounds clear to auscultation, no increased work of breathing, stable on room air Cardiovascular: Normal heart sounds S1-S2, no tachycardia, extremities warm to touch Abdomen: 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with stool noted in collection bag, bowel sounds present.  Minimally tender over incision sites.  LBM 1/31 Genitourinary: Chronic Foley catheter  Neurologic: CN 2-12 grossly intact. Sensation intact, DTR normal. Strength 5/5 x all 4 extremities.  Psychiatric: Normal judgment and insight. Alert and oriented x 3.  Flat mood and affect.   Discharge Instructions   Discharge Instructions    Call MD for:  extreme fatigue   Complete by: As directed    Call MD for:  persistant dizziness or light-headedness   Complete by: As directed    Call MD for:  persistant nausea and vomiting   Complete by: As directed    Call MD for:  redness, tenderness, or signs of infection (pain, swelling, redness, odor or  green/yellow discharge around incision site)   Complete by: As directed    Call MD for:  severe uncontrolled pain   Complete by: As directed    Call MD for:  temperature >100.4   Complete by: As directed    Diet general   Complete by: As directed    Increase activity slowly   Complete by: As directed    No wound care   Complete by: As directed    Routine colostomy care. Home health has been notified of need for appropriate ostomy supplies.     Allergies as of 05/03/2020      Reactions   Nsaids Other (See Comments)   Bleeding GI Ulceration   Ciprofloxacin Nausea And Vomiting      Medication List    STOP taking these medications   docusate sodium 100 MG capsule Commonly known as: COLACE   oxyCODONE-acetaminophen 5-325 MG tablet Commonly known as: PERCOCET/ROXICET   prazosin 1 MG capsule Commonly known as: MINIPRESS   sevelamer carbonate 800 MG tablet Commonly known as: RENVELA   sodium chloride flush 0.9 % Soln injection   tamsulosin 0.4 MG Caps capsule Commonly known as: FLOMAX     TAKE these medications   acetaminophen 500 MG tablet Commonly known as: TYLENOL Take 1 tablet (500 mg total) by mouth every 6 (six) hours as needed for mild pain, fever or headache.   calcium carbonate 500 MG chewable tablet Commonly known as: TUMS - dosed in mg elemental calcium Chew 1 tablet (200 mg of elemental calcium total) by mouth 3 (three) times daily.   cyanocobalamin 1000 MCG/ML injection Commonly known as: (VITAMIN B-12) Inject 1,000 mcg into the muscle every 30 (thirty) days.   DULoxetine 30 MG capsule Commonly known as: CYMBALTA Take 1-2 capsules (30-60 mg total) by mouth See admin instructions. Take 60 mg in the morning and 30 mg in the evening   gabapentin  100 MG capsule Commonly known as: NEURONTIN Take 2 capsules (200 mg total) by mouth daily. What changed:   how much to take  when to take this   gabapentin 100 MG capsule Commonly known as: NEURONTIN Take  2 capsules (200 mg total) by mouth daily. What changed: You were already taking a medication with the same name, and this prescription was added. Make sure you understand how and when to take each.   gabapentin 300 MG capsule Commonly known as: NEURONTIN Take 1 capsule (300 mg total) by mouth at bedtime. What changed: You were already taking a medication with the same name, and this prescription was added. Make sure you understand how and when to take each.   hydrOXYzine 10 MG tablet Commonly known as: ATARAX/VISTARIL Take 1 tablet (10 mg total) by mouth 3 (three) times daily as needed for itching or anxiety. What changed: reasons to take this   multivitamin with minerals Tabs tablet Take 1 tablet by mouth daily.   oxyCODONE 5 MG immediate release tablet Commonly known as: Oxy IR/ROXICODONE Take 1-2 tablets (5-10 mg total) by mouth every 4 (four) hours as needed for moderate pain.   pantoprazole 20 MG tablet Commonly known as: PROTONIX Take 1 tablet (20 mg total) by mouth 2 (two) times daily.   polycarbophil 625 MG tablet Commonly known as: FIBERCON Take 1 tablet (625 mg total) by mouth 2 (two) times daily.   sodium bicarbonate 650 MG tablet Take 1 tablet (650 mg total) by mouth 2 (two) times daily.   tiZANidine 2 MG tablet Commonly known as: ZANAFLEX Take 1 tablet (2 mg total) by mouth every 8 (eight) hours as needed for muscle spasms.   Vitamin D3 20 MCG (800 UNIT) Tabs Take 800 Units by mouth daily.            Durable Medical Equipment  (From admission, onward)         Start     Ordered   04/07/20 1638  For home use only DME Walker rolling  Once       Question Answer Comment  Walker: With 5 Inch Wheels   Patient needs a walker to treat with the following condition Physical deconditioning      04/07/20 1637   04/07/20 1638  For home use only DME Bedside commode  Once       Question:  Patient needs a bedside commode to treat with the following condition   Answer:  Physical deconditioning   04/07/20 1638         Allergies  Allergen Reactions  . Nsaids Other (See Comments)    Bleeding GI Ulceration  . Ciprofloxacin Nausea And Vomiting    Follow-up Information    Michael Boston, MD. Schedule an appointment as soon as possible for a visit in 3 weeks.   Specialty: General Surgery Why: To follow up after your operation, To follow up after your hospital stay Contact information: [place] [address] [phone]        Raynelle Bring, MD. Schedule an appointment as soon as possible for a visit in 3 weeks.   Specialty: Urology Why: To follow up after your operation, To follow up after your hospital stay Contact information: [place] [address] [phone]        Home, [facility].   Specialty: Home Health Services Why: [facility] will be providing home health RN, PT, OT and aide.  They  will call you in the next 24-48 hours after your discharge home to start care at your home.   Contact information: [place] [address] [phone]        Frances Maywood, NP. Schedule an appointment as soon as possible for a visit.   Specialty: Family Medicine Why: Please call and make a hospital follow up appointment in the next 7-10 days after being discharged from the hospital. Contact information: [place] [address] [phone]        Roney Jaffe, MD. Schedule an appointment as soon as possible for a visit in 3 week(s).   Specialty: Nephrology Contact information: [place] [address] [phone]                The results of significant diagnostics from this hospitalization (including imaging, microbiology, ancillary and laboratory) are listed below for reference.    Significant Diagnostic Studies: DG Cystogram  Result Date: 04/19/2020 CLINICAL DATA:  Bilateral stent placement EXAM: NEPHROSTOMY TUBE  INJECTION AND BILATERAL STENT PLACEMENT COMPARISON:  CT scan 03/31/2020 FINDINGS: The first 2 images depict injection of a left nephrostomy tube, showing filling defects in the right renal collecting system which could be gas bubbles or blood clots. The proximal loop of the left double-J ureteral stent appears to be in the proximal ureter on these images. There mobile filling defects in the ureter, probably gas bubbles. The image at 4:16 p.m. shows a double-J ureteral stent with proximal loop now in the collecting system. The image at 4:18 p.m. demonstrates injection of the right nephrostomy tube showing a dilated right collecting system extending down to the level of the UPJ. The double-J ureteral stent proximal margin appears to be in the proximal ureter on this image, but by 4:22 p.m. the double-J ureteral stent proximal loop is in the dilated collecting system. The final image at 4:23 p.m. demonstrates the distal ends of the stents both in the urinary bladder which demonstrates dilute contrast. IMPRESSION: 1. Positioning of the double-J ureteral stents with proximal loops in the renal pelvis bilaterally. Electronically Signed   By: Van Clines M.D.   On: 04/19/2020 08:07   IR Nephro Tube Remov/FL  Result Date: 04/25/2020 INDICATION: Bilateral nephrostomies, surgically placed internal ureteral stents, assess for nephrostomy removals EXAM: BILATERAL NEPHROSTOMY REMOVAL UNDER FLUOROSCOPY MEDICATIONS: NONE. ANESTHESIA/SEDATION: Moderate Sedation Time:  None. The patient was continuously monitored during the procedure by the interventional radiology nurse under my direct supervision. CONTRAST:  20 cc-administered into the collecting system(s) FLUOROSCOPY TIME:  Fluoroscopy Time: 1 minutes 24 seconds (31 mGy). COMPLICATIONS: None immediate. PROCEDURE: Under sterile conditions, initially the left nephrostomy catheter was injected with contrast. This confirms antegrade flow and drainage in the left internal  ureteral stent. Nephrostomy catheter was cut and removed over a guidewire under fluoroscopy without dislodging the stent. On the right side, the nephrostomy catheter has retracted into the percutaneous tract and is just beneath the skin in the subcutaneous tissues. Therefore the malposition catheter was cut and removed. FINDINGS: As above IMPRESSION: Successful removal of the bilateral nephrostomy catheters Electronically Signed   By: Jerilynn Mages.  Shick M.D.   On: 04/25/2020 14:47   CT CYSTOGRAM PELVIS  Result Date: 05/01/2020 CLINICAL DATA:  Evaluate for colovesical fistula EXAM: CT CYSTOGRAM (CT PELVIS WITH CONTRAST) TECHNIQUE: Multidetector CT imaging through the pelvis was performed after dilute contrast had been introduced into the bladder for the purposes of performing CT cystography. CONTRAST:  60mL OMNIPAQUE  IOHEXOL 300 MG/ML  SOLN COMPARISON:  04/20/2020 FINDINGS: Urinary Tract: Bilateral nephroureteral stents are identified. A Foley catheter balloon is in place. There is a small amount of gas within the nondependent portion of the urinary bladder. Retrograde opacification of the urinary bladder was achieved with 50 cc of dilute Omnipaque 300 via indwelling Foley catheter. Reflux of contrast within both ureters noted. Opacification of several small posterior and right lateral intramural bladder diverticula noted. No extravasation of the contrast material identified in no fistulous communication with the surrounding bowel loops including the rectum. Bowel: Left lower quadrant loop colostomy. Retained contrast material within the excluded rectum (Hartmann's pouch) was noted on the precontrast images. The volume of high attenuation material within the rectum is unchanged between the pre and postcontrast images. Vascular/Lymphatic: Aortic atherosclerosis. No abdominopelvic adenopathy. Reproductive:  Prostate gland enlargement. Other:  No free fluid or fluid collections identified. Musculoskeletal: No suspicious bone  lesions identified. IMPRESSION: 1. No evidence for colovesical fistula. 2. Bilateral nephroureteral stents are identified. 3. Prostate gland enlargement. 4. Aortic atherosclerosis. Aortic Atherosclerosis (ICD10-I70.0). Electronically Signed   By: Kerby Moors M.D.   On: 05/01/2020 10:47   CT CYSTOGRAM PELVIS  Result Date: 04/20/2020 CLINICAL DATA:  Vesicointestinal fistula. EXAM: CT CYSTOGRAM (CT PELVIS WITH CONTRAST) TECHNIQUE: Multidetector CT imaging through the pelvis was performed after dilute contrast had been introduced into the bladder for the purposes of performing CT cystography. CONTRAST:  50 mL OMNIPAQUE IOHEXOL 300 MG/ML  SOLN COMPARISON:  None. FINDINGS: Urinary Tract: Hand injection water-soluble contrast into the bladder via the Foley catheter. The bladder is distended. Contrast reflux into the ureters. Bilateral ureteral stents are present. Within the excluded rectal pouch (Hartmann's pouch) there is small amount of dependent high density contrast (image 29/3). No precontrast imaging was performed therefore the timing of the contrast entering the Hartmann's pouch is unclear. There is no direct communication identified however concern for colon leak. Within the RIGHT lower quadrant there is high-density material within a loop of colon (image 1/3). Bowel:  LEFT lower quadrant colostomy. Vascular/Lymphatic: Lymphadenopathy Reproductive:  Prostate unremarkable Other:  Subcutaneous gas in chest wall related to recent surgery. Musculoskeletal: No aggressive osseous lesion IMPRESSION: 1. High-density material within the excluded rectum (Hartmann's pouch) may represent communication with the bladder currently or in the recent past. Percutaneous nephrograms performed 04/18/2020. No direct communication identified currently. 2. Second focus of high-density material within the bowel of the RIGHT lower quadrant. Unclear timing of contrast accumulation but also concerning for communication with the  bladder. 3. Difficult to ascertain timing of high-density material accumulation in bowel as there was precontrast imaging prior to the administration of the bladder contrast. 4. The bladder is well distended with reflux into the ureters. NO active leaking identified however concern for occult leak as described above. Electronically Signed   By: Suzy Bouchard M.D.   On: 04/20/2020 20:03    Microbiology: No results found for this or any previous visit (from the past 240 hour(s)).   Labs: Basic Metabolic Panel: No results for input(s): NA, K, CL, CO2, GLUCOSE, BUN, CREATININE, CALCIUM, MG, PHOS in the last 168 hours. Liver Function Tests: No results for input(s): AST, ALT, ALKPHOS, BILITOT, PROT, ALBUMIN in the last 168 hours. No results for input(s): LIPASE, AMYLASE in the last 168 hours. No results for input(s): AMMONIA in the last 168 hours. CBC: No results for input(s): WBC, NEUTROABS, HGB, HCT, MCV, PLT in the last 168 hours. Cardiac Enzymes: No results for input(s): CKTOTAL, CKMB,  CKMBINDEX, TROPONINI in the last 168 hours. BNP: BNP (last 3 results) No results for input(s): BNP in the last 8760 hours.  ProBNP (last 3 results) No results for input(s): PROBNP in the last 8760 hours.  CBG: No results for input(s): GLUCAP in the last 168 hours.     Signed:  Erin Hearing ANP [facility] 05/03/2020, 10:59 AM

## 2020-05-03 NOTE — Progress Notes (Signed)
Occupational Therapy Treatment Patient Details Name: Chase ORVIS Sr. 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 He was admitted for acute renal failure complicating stage V CKD secondary to urinary obstruction and dehydration with severe electrolyte derangements.  Urology was consulted.  Patient underwent hemodialysis. s/p ureteral stent placement, retrograde pyelogram, lap sigmoid colectomy with colostomy, and epigastric hernia repair on 1/18. B PCN tubes removed 1/25.   OT comments  Focus of session on educating pt in energy conservation strategies, fall prevention and home safety. Pt has all necessary DME already in his home and his mom is coming to stay with him. Pt to discharge this pm.   Follow Up Recommendations  Home health OT    Equipment Recommendations  None recommended by OT    Recommendations for Other Services      Precautions / Restrictions Precautions Precautions: Fall Precaution Comments: colostomy, long term foley       Mobility Bed Mobility Overal bed mobility: Modified Independent                Transfers                      Balance                                           ADL either performed or assessed with clinical judgement   ADL                                         General ADL Comments: Pt declined OOB, wants to wait until he puts his clothes on for discharge? Used visit to instruct pt in home safety, fall prevention and energy conservation strategies. Mom is coming from [place] to stay with pt.      Vision       Perception     Praxis      Cognition Arousal/Alertness: Awake/alert Behavior During Therapy: WFL for tasks assessed/performed Overall Cognitive Status: Within Functional Limits for tasks assessed                                          Exercises     Shoulder Instructions       General Comments      Pertinent Vitals/ Pain       Pain Assessment: Faces Faces Pain Scale: Hurts little more Pain Location: back Pain Descriptors / Indicators: Discomfort;Sore Pain Intervention(s): Premedicated before session  Home Living                                          Prior Functioning/Environment              Frequency  Min 2X/week  Progress Toward Goals  OT Goals(current goals can now be found in the care plan section)  Progress towards OT goals: Progressing toward goals  Acute Rehab OT Goals Patient Stated Goal: go home OT Goal Formulation: With patient Time For Goal Achievement: 05/04/20 Potential to Achieve Goals: Good  Plan Discharge plan remains appropriate    Co-evaluation                 AM-PAC OT "6 Clicks" Daily Activity     Outcome Measure   Help from another person eating meals?: None Help from another person taking care of personal grooming?: A Little Help from another person toileting, which includes using toliet, bedpan, or urinal?: A Little Help from another person bathing (including washing, rinsing, drying)?: A Little Help from another person to put on and taking off regular upper body clothing?: None Help from another person to put on and taking off regular lower body clothing?: A Little 6 Click Score: 20    End of Session    OT Visit Diagnosis: Unsteadiness on feet (R26.81);Other abnormalities of gait and mobility (R26.89);Muscle weakness (generalized) (M62.81);Adult, failure to thrive (R62.7)   Activity Tolerance     Patient Left in bed;with call bell/phone within  reach   Nurse Communication          Time: 4492-0100 OT Time Calculation (min): 20 min  Charges: OT General Charges $OT Visit: 1 Visit OT Treatments $Self Care/Home Management : 8-22 mins  Nestor Lewandowsky, OTR/L Acute Rehabilitation Services Pager: [phone] Office: [phone]   Malka So 05/03/2020, 10:37 AM

## 2020-06-05 ENCOUNTER — Ambulatory Visit: Payer: No Typology Code available for payment source | Admitting: Neurology

## 2020-06-27 ENCOUNTER — Inpatient Hospital Stay (HOSPITAL_COMMUNITY): Payer: No Typology Code available for payment source

## 2020-06-27 ENCOUNTER — Emergency Department (HOSPITAL_COMMUNITY): Payer: No Typology Code available for payment source

## 2020-06-27 ENCOUNTER — Inpatient Hospital Stay (HOSPITAL_COMMUNITY)
Admission: EM | Admit: 2020-06-27 | Discharge: 2020-07-07 | DRG: 853 | Disposition: A | Discharge status: Home Health | Payer: No Typology Code available for payment source | Attending: Internal Medicine | Admitting: Internal Medicine

## 2020-06-27 ENCOUNTER — Other Ambulatory Visit: Payer: Self-pay

## 2020-06-27 ENCOUNTER — Encounter (HOSPITAL_COMMUNITY): Payer: Self-pay | Admitting: *Deleted

## 2020-06-27 DIAGNOSIS — Z66 Do not resuscitate: Secondary | ICD-10-CM | POA: Diagnosis present

## 2020-06-27 DIAGNOSIS — R571 Hypovolemic shock: Secondary | ICD-10-CM | POA: Diagnosis not present

## 2020-06-27 DIAGNOSIS — N401 Enlarged prostate with lower urinary tract symptoms: Secondary | ICD-10-CM | POA: Diagnosis present

## 2020-06-27 DIAGNOSIS — A419 Sepsis, unspecified organism: Secondary | ICD-10-CM | POA: Diagnosis present

## 2020-06-27 DIAGNOSIS — I252 Old myocardial infarction: Secondary | ICD-10-CM | POA: Diagnosis not present

## 2020-06-27 DIAGNOSIS — Z20822 Contact with and (suspected) exposure to covid-19: Secondary | ICD-10-CM | POA: Diagnosis present

## 2020-06-27 DIAGNOSIS — N189 Chronic kidney disease, unspecified: Secondary | ICD-10-CM | POA: Diagnosis not present

## 2020-06-27 DIAGNOSIS — E872 Acidosis: Secondary | ICD-10-CM

## 2020-06-27 DIAGNOSIS — G9340 Encephalopathy, unspecified: Secondary | ICD-10-CM | POA: Diagnosis not present

## 2020-06-27 DIAGNOSIS — Z6822 Body mass index (BMI) 22.0-22.9, adult: Secondary | ICD-10-CM

## 2020-06-27 DIAGNOSIS — I2510 Atherosclerotic heart disease of native coronary artery without angina pectoris: Secondary | ICD-10-CM | POA: Diagnosis present

## 2020-06-27 DIAGNOSIS — R410 Disorientation, unspecified: Secondary | ICD-10-CM | POA: Diagnosis not present

## 2020-06-27 DIAGNOSIS — N261 Atrophy of kidney (terminal): Secondary | ICD-10-CM | POA: Diagnosis present

## 2020-06-27 DIAGNOSIS — N1330 Unspecified hydronephrosis: Secondary | ICD-10-CM

## 2020-06-27 DIAGNOSIS — N19 Unspecified kidney failure: Secondary | ICD-10-CM | POA: Diagnosis present

## 2020-06-27 DIAGNOSIS — R52 Pain, unspecified: Secondary | ICD-10-CM

## 2020-06-27 DIAGNOSIS — L89302 Pressure ulcer of unspecified buttock, stage 2: Secondary | ICD-10-CM | POA: Diagnosis not present

## 2020-06-27 DIAGNOSIS — R6520 Severe sepsis without septic shock: Secondary | ICD-10-CM | POA: Diagnosis not present

## 2020-06-27 DIAGNOSIS — M6282 Rhabdomyolysis: Secondary | ICD-10-CM | POA: Diagnosis present

## 2020-06-27 DIAGNOSIS — Z8249 Family history of ischemic heart disease and other diseases of the circulatory system: Secondary | ICD-10-CM

## 2020-06-27 DIAGNOSIS — Z781 Physical restraint status: Secondary | ICD-10-CM

## 2020-06-27 DIAGNOSIS — R0989 Other specified symptoms and signs involving the circulatory and respiratory systems: Secondary | ICD-10-CM

## 2020-06-27 DIAGNOSIS — Z8051 Family history of malignant neoplasm of kidney: Secondary | ICD-10-CM | POA: Diagnosis not present

## 2020-06-27 DIAGNOSIS — R5381 Other malaise: Secondary | ICD-10-CM | POA: Diagnosis present

## 2020-06-27 DIAGNOSIS — E43 Unspecified severe protein-calorie malnutrition: Secondary | ICD-10-CM | POA: Diagnosis present

## 2020-06-27 DIAGNOSIS — F32A Depression, unspecified: Secondary | ICD-10-CM | POA: Diagnosis present

## 2020-06-27 DIAGNOSIS — G928 Other toxic encephalopathy: Secondary | ICD-10-CM | POA: Diagnosis present

## 2020-06-27 DIAGNOSIS — Z8673 Personal history of transient ischemic attack (TIA), and cerebral infarction without residual deficits: Secondary | ICD-10-CM | POA: Diagnosis not present

## 2020-06-27 DIAGNOSIS — J9601 Acute respiratory failure with hypoxia: Secondary | ICD-10-CM | POA: Diagnosis not present

## 2020-06-27 DIAGNOSIS — R7401 Elevation of levels of liver transaminase levels: Secondary | ICD-10-CM | POA: Diagnosis present

## 2020-06-27 DIAGNOSIS — Z933 Colostomy status: Secondary | ICD-10-CM

## 2020-06-27 DIAGNOSIS — G894 Chronic pain syndrome: Secondary | ICD-10-CM | POA: Diagnosis present

## 2020-06-27 DIAGNOSIS — N179 Acute kidney failure, unspecified: Secondary | ICD-10-CM | POA: Diagnosis not present

## 2020-06-27 DIAGNOSIS — D631 Anemia in chronic kidney disease: Secondary | ICD-10-CM | POA: Diagnosis present

## 2020-06-27 DIAGNOSIS — Z800 Family history of malignant neoplasm of digestive organs: Secondary | ICD-10-CM | POA: Diagnosis not present

## 2020-06-27 DIAGNOSIS — D638 Anemia in other chronic diseases classified elsewhere: Secondary | ICD-10-CM | POA: Diagnosis not present

## 2020-06-27 DIAGNOSIS — R338 Other retention of urine: Secondary | ICD-10-CM | POA: Diagnosis not present

## 2020-06-27 DIAGNOSIS — Z452 Encounter for adjustment and management of vascular access device: Secondary | ICD-10-CM

## 2020-06-27 DIAGNOSIS — Z881 Allergy status to other antibiotic agents status: Secondary | ICD-10-CM

## 2020-06-27 DIAGNOSIS — K219 Gastro-esophageal reflux disease without esophagitis: Secondary | ICD-10-CM | POA: Diagnosis present

## 2020-06-27 DIAGNOSIS — L89152 Pressure ulcer of sacral region, stage 2: Secondary | ICD-10-CM | POA: Diagnosis present

## 2020-06-27 DIAGNOSIS — R6521 Severe sepsis with septic shock: Secondary | ICD-10-CM | POA: Diagnosis not present

## 2020-06-27 DIAGNOSIS — N131 Hydronephrosis with ureteral stricture, not elsewhere classified: Secondary | ICD-10-CM | POA: Diagnosis present

## 2020-06-27 DIAGNOSIS — Z888 Allergy status to other drugs, medicaments and biological substances status: Secondary | ICD-10-CM

## 2020-06-27 DIAGNOSIS — G9341 Metabolic encephalopathy: Secondary | ICD-10-CM

## 2020-06-27 DIAGNOSIS — F17200 Nicotine dependence, unspecified, uncomplicated: Secondary | ICD-10-CM | POA: Diagnosis present

## 2020-06-27 DIAGNOSIS — R7989 Other specified abnormal findings of blood chemistry: Secondary | ICD-10-CM | POA: Diagnosis present

## 2020-06-27 DIAGNOSIS — N170 Acute kidney failure with tubular necrosis: Secondary | ICD-10-CM | POA: Diagnosis present

## 2020-06-27 DIAGNOSIS — Z4659 Encounter for fitting and adjustment of other gastrointestinal appliance and device: Secondary | ICD-10-CM

## 2020-06-27 DIAGNOSIS — R627 Adult failure to thrive: Secondary | ICD-10-CM | POA: Diagnosis present

## 2020-06-27 DIAGNOSIS — Z9884 Bariatric surgery status: Secondary | ICD-10-CM | POA: Diagnosis not present

## 2020-06-27 DIAGNOSIS — N185 Chronic kidney disease, stage 5: Secondary | ICD-10-CM | POA: Diagnosis present

## 2020-06-27 DIAGNOSIS — E8809 Other disorders of plasma-protein metabolism, not elsewhere classified: Secondary | ICD-10-CM | POA: Diagnosis present

## 2020-06-27 DIAGNOSIS — D61818 Other pancytopenia: Secondary | ICD-10-CM | POA: Diagnosis not present

## 2020-06-27 DIAGNOSIS — R64 Cachexia: Secondary | ICD-10-CM | POA: Diagnosis present

## 2020-06-27 DIAGNOSIS — E538 Deficiency of other specified B group vitamins: Secondary | ICD-10-CM | POA: Diagnosis present

## 2020-06-27 DIAGNOSIS — Z8571 Personal history of Hodgkin lymphoma: Secondary | ICD-10-CM | POA: Diagnosis not present

## 2020-06-27 DIAGNOSIS — R579 Shock, unspecified: Secondary | ICD-10-CM | POA: Diagnosis not present

## 2020-06-27 DIAGNOSIS — L8990 Pressure ulcer of unspecified site, unspecified stage: Secondary | ICD-10-CM | POA: Insufficient documentation

## 2020-06-27 DIAGNOSIS — Z8711 Personal history of peptic ulcer disease: Secondary | ICD-10-CM

## 2020-06-27 DIAGNOSIS — R0600 Dyspnea, unspecified: Secondary | ICD-10-CM | POA: Diagnosis not present

## 2020-06-27 DIAGNOSIS — E876 Hypokalemia: Secondary | ICD-10-CM | POA: Diagnosis present

## 2020-06-27 DIAGNOSIS — Z79899 Other long term (current) drug therapy: Secondary | ICD-10-CM

## 2020-06-27 DIAGNOSIS — E878 Other disorders of electrolyte and fluid balance, not elsewhere classified: Secondary | ICD-10-CM | POA: Diagnosis present

## 2020-06-27 DIAGNOSIS — F419 Anxiety disorder, unspecified: Secondary | ICD-10-CM | POA: Diagnosis present

## 2020-06-27 LAB — BLOOD GAS, ARTERIAL
Acid-base deficit: 20 mmol/L — ABNORMAL HIGH (ref 0.0–2.0)
Acid-base deficit: 29.8 mmol/L — ABNORMAL HIGH (ref 0.0–2.0)
Bicarbonate: 1.7 mmol/L — ABNORMAL LOW (ref 20.0–28.0)
Bicarbonate: 1.8 mmol/L — ABNORMAL LOW (ref 20.0–28.0)
Bicarbonate: 6 mmol/L — ABNORMAL LOW (ref 20.0–28.0)
Drawn by: 331471
FIO2: 21
FIO2: 21
FIO2: 32
O2 Saturation: 96.8 %
O2 Saturation: 96.9 %
O2 Saturation: 97.1 %
O2 Saturation: 97.5 %
Patient temperature: 37
Patient temperature: 95.9
Patient temperature: 98.6
Patient temperature: 99.9
pCO2 arterial: 14.6 mmHg — CL (ref 32.0–48.0)
pCO2 arterial: <19 mmHg — CL (ref 32.0–48.0)
pCO2 arterial: <19 mmHg — CL (ref 32.0–48.0)
pCO2 arterial: <19 mmHg — CL (ref 32.0–48.0)
pH, Arterial: 6.844 — CL (ref 7.350–7.450)
pH, Arterial: 6.886 — CL (ref 7.350–7.450)
pH, Arterial: 7.245 — ABNORMAL LOW (ref 7.350–7.450)
pH, Arterial: <6.8 — CL (ref 7.350–7.450)
pO2, Arterial: 122 mmHg — ABNORMAL HIGH (ref 83.0–108.0)
pO2, Arterial: 142 mmHg — ABNORMAL HIGH (ref 83.0–108.0)
pO2, Arterial: 148 mmHg — ABNORMAL HIGH (ref 83.0–108.0)
pO2, Arterial: 185 mmHg — ABNORMAL HIGH (ref 83.0–108.0)

## 2020-06-27 LAB — URINALYSIS, ROUTINE W REFLEX MICROSCOPIC
Bilirubin Urine: NEGATIVE
Glucose, UA: NEGATIVE mg/dL
Ketones, ur: 5 mg/dL — AB
Nitrite: NEGATIVE
Protein, ur: 100 mg/dL — AB
Specific Gravity, Urine: 1.014 (ref 1.005–1.030)
WBC, UA: >50 WBC/hpf — ABNORMAL HIGH (ref 0–5)
pH: 6 (ref 5.0–8.0)

## 2020-06-27 LAB — HEPATIC FUNCTION PANEL
ALT: 77 U/L — ABNORMAL HIGH (ref 0–44)
AST: 84 U/L — ABNORMAL HIGH (ref 15–41)
Albumin: 2.6 g/dL — ABNORMAL LOW (ref 3.5–5.0)
Alkaline Phosphatase: 166 U/L — ABNORMAL HIGH (ref 38–126)
Bilirubin, Direct: 0.1 mg/dL (ref 0.0–0.2)
Indirect Bilirubin: 1 mg/dL — ABNORMAL HIGH (ref 0.3–0.9)
Total Bilirubin: 1.1 mg/dL (ref 0.3–1.2)
Total Protein: 6 g/dL — ABNORMAL LOW (ref 6.5–8.1)

## 2020-06-27 LAB — RESP PANEL BY RT-PCR (FLU A&B, COVID) ARPGX2
Influenza A by PCR: NEGATIVE
Influenza B by PCR: NEGATIVE
SARS Coronavirus 2 by RT PCR: NEGATIVE

## 2020-06-27 LAB — RAPID URINE DRUG SCREEN, HOSP PERFORMED
Amphetamines: NOT DETECTED
Barbiturates: NOT DETECTED
Benzodiazepines: NOT DETECTED
Cocaine: NOT DETECTED
Opiates: NOT DETECTED
Tetrahydrocannabinol: NOT DETECTED

## 2020-06-27 LAB — CBC
HCT: 25.6 % — ABNORMAL LOW (ref 39.0–52.0)
Hemoglobin: 8.4 g/dL — ABNORMAL LOW (ref 13.0–17.0)
MCH: 31.3 pg (ref 26.0–34.0)
MCHC: 32.8 g/dL (ref 30.0–36.0)
MCV: 95.5 fL (ref 80.0–100.0)
Platelets: 191 10*3/uL (ref 150–400)
RBC: 2.68 MIL/uL — ABNORMAL LOW (ref 4.22–5.81)
RDW: 16.5 % — ABNORMAL HIGH (ref 11.5–15.5)
WBC: 5.5 10*3/uL (ref 4.0–10.5)
nRBC: 6.8 % — ABNORMAL HIGH (ref 0.0–0.2)

## 2020-06-27 LAB — CBC WITH DIFFERENTIAL/PLATELET
Abs Immature Granulocytes: 0.39 10*3/uL — ABNORMAL HIGH (ref 0.00–0.07)
Basophils Absolute: 0.1 10*3/uL (ref 0.0–0.1)
Basophils Relative: 1 %
Eosinophils Absolute: 0 10*3/uL (ref 0.0–0.5)
Eosinophils Relative: 0 %
HCT: 28.5 % — ABNORMAL LOW (ref 39.0–52.0)
Hemoglobin: 9.3 g/dL — ABNORMAL LOW (ref 13.0–17.0)
Immature Granulocytes: 3 %
Lymphocytes Relative: 4 %
Lymphs Abs: 0.5 10*3/uL — ABNORMAL LOW (ref 0.7–4.0)
MCH: 31.8 pg (ref 26.0–34.0)
MCHC: 32.6 g/dL (ref 30.0–36.0)
MCV: 97.6 fL (ref 80.0–100.0)
Monocytes Absolute: 0.5 10*3/uL (ref 0.1–1.0)
Monocytes Relative: 4 %
Neutro Abs: 11.7 10*3/uL — ABNORMAL HIGH (ref 1.7–7.7)
Neutrophils Relative %: 88 %
Platelets: 234 10*3/uL (ref 150–400)
RBC: 2.92 MIL/uL — ABNORMAL LOW (ref 4.22–5.81)
RDW: 16.7 % — ABNORMAL HIGH (ref 11.5–15.5)
WBC: 13.2 10*3/uL — ABNORMAL HIGH (ref 4.0–10.5)
nRBC: 1.8 % — ABNORMAL HIGH (ref 0.0–0.2)

## 2020-06-27 LAB — COMPREHENSIVE METABOLIC PANEL
ALT: 84 U/L — ABNORMAL HIGH (ref 0–44)
AST: 77 U/L — ABNORMAL HIGH (ref 15–41)
Albumin: 2.9 g/dL — ABNORMAL LOW (ref 3.5–5.0)
Alkaline Phosphatase: 178 U/L — ABNORMAL HIGH (ref 38–126)
BUN: 102 mg/dL — ABNORMAL HIGH (ref 6–20)
CO2: <7 mmol/L — ABNORMAL LOW (ref 22–32)
Calcium: 7.6 mg/dL — ABNORMAL LOW (ref 8.9–10.3)
Chloride: 113 mmol/L — ABNORMAL HIGH (ref 98–111)
Creatinine, Ser: 10.66 mg/dL — ABNORMAL HIGH (ref 0.61–1.24)
GFR, Estimated: 5 mL/min — ABNORMAL LOW (ref >60)
Glucose, Bld: 86 mg/dL (ref 70–99)
Potassium: 5.1 mmol/L (ref 3.5–5.1)
Sodium: 139 mmol/L (ref 135–145)
Total Bilirubin: 1.1 mg/dL (ref 0.3–1.2)
Total Protein: 6.6 g/dL (ref 6.5–8.1)

## 2020-06-27 LAB — LACTIC ACID, PLASMA
Lactic Acid, Venous: 0.9 mmol/L (ref 0.5–1.9)
Lactic Acid, Venous: 0.6 mmol/L (ref 0.5–1.9)

## 2020-06-27 LAB — BASIC METABOLIC PANEL
BUN: 115 mg/dL — ABNORMAL HIGH (ref 6–20)
CO2: <7 mmol/L — ABNORMAL LOW (ref 22–32)
Calcium: 6.7 mg/dL — ABNORMAL LOW (ref 8.9–10.3)
Chloride: 114 mmol/L — ABNORMAL HIGH (ref 98–111)
Creatinine, Ser: 10.16 mg/dL — ABNORMAL HIGH (ref 0.61–1.24)
GFR, Estimated: 5 mL/min — ABNORMAL LOW (ref >60)
Glucose, Bld: 87 mg/dL (ref 70–99)
Potassium: 4.9 mmol/L (ref 3.5–5.1)
Sodium: 140 mmol/L (ref 135–145)

## 2020-06-27 LAB — ETHANOL: Alcohol, Ethyl (B): <10 mg/dL (ref <10)

## 2020-06-27 LAB — MRSA PCR SCREENING: MRSA by PCR: NEGATIVE

## 2020-06-27 LAB — CK: Total CK: 2210 U/L — ABNORMAL HIGH (ref 49–397)

## 2020-06-27 LAB — GLUCOSE, CAPILLARY
Glucose-Capillary: 78 mg/dL (ref 70–99)
Glucose-Capillary: 65 mg/dL — ABNORMAL LOW (ref 70–99)

## 2020-06-27 LAB — CBG MONITORING, ED: Glucose-Capillary: 80 mg/dL (ref 70–99)

## 2020-06-27 MED ORDER — HEPARIN SODIUM (PORCINE) 1000 UNIT/ML IJ SOLN
1000.0000 [IU] | INTRAMUSCULAR | Status: DC | PRN
  Administered 2020-06-27 – 2020-06-30 (×2): 2400 [IU]
  Filled 2020-06-27 (×3): qty 10

## 2020-06-27 MED ORDER — POLYETHYLENE GLYCOL 3350 17 G PO PACK: 17.0000 g | PACK | Freq: Every day | ORAL | Status: DC | PRN

## 2020-06-27 MED ORDER — PRISMASOL BGK 0/2.5 32-2.5 MEQ/L EC SOLN
Status: DC
  Filled 2020-06-27 (×9): qty 5000

## 2020-06-27 MED ORDER — SODIUM CHLORIDE 0.9 % FOR CRRT: INTRAVENOUS_CENTRAL | Status: DC | PRN

## 2020-06-27 MED ORDER — HEPARIN SODIUM (PORCINE) 5000 UNIT/ML IJ SOLN
5000.0000 [IU] | Freq: Three times a day (TID) | INTRAMUSCULAR | Status: DC
  Administered 2020-06-27 – 2020-07-07 (×29): 5000 [IU] via SUBCUTANEOUS
  Filled 2020-06-27 (×29): qty 1

## 2020-06-27 MED ORDER — SODIUM CHLORIDE 0.9 % IV BOLUS
1000.0000 mL | Freq: Once | INTRAVENOUS | Status: AC
  Administered 2020-06-27: 1000 mL via INTRAVENOUS

## 2020-06-27 MED ORDER — PRISMASOL BGK 4/2.5 32-4-2.5 MEQ/L REPLACEMENT SOLN: Status: DC

## 2020-06-27 MED ORDER — STERILE WATER FOR INJECTION IV SOLN
INTRAVENOUS | Status: DC
  Filled 2020-06-27 (×3): qty 850
  Filled 2020-06-27 (×2): qty 150
  Filled 2020-06-27: qty 850

## 2020-06-27 MED ORDER — PANTOPRAZOLE SODIUM 40 MG IV SOLR
40.0000 mg | Freq: Every day | INTRAVENOUS | Status: DC
  Administered 2020-06-27 – 2020-06-30 (×4): 40 mg via INTRAVENOUS
  Filled 2020-06-27 (×4): qty 40

## 2020-06-27 MED ORDER — CHLORHEXIDINE GLUCONATE CLOTH 2 % EX PADS
6.0000 | MEDICATED_PAD | Freq: Every day | CUTANEOUS | Status: DC
  Administered 2020-06-27 – 2020-07-07 (×11): 6 via TOPICAL

## 2020-06-27 MED ORDER — SODIUM CHLORIDE 0.9 % IV SOLN
2.0000 g | Freq: Once | INTRAVENOUS | Status: AC
  Administered 2020-06-27: 2 g via INTRAVENOUS
  Filled 2020-06-27: qty 20

## 2020-06-27 MED ORDER — SODIUM BICARBONATE 8.4 % IV SOLN
200.0000 meq | Freq: Once | INTRAVENOUS | Status: AC
  Administered 2020-06-27: 200 meq via INTRAVENOUS
  Filled 2020-06-27: qty 200

## 2020-06-27 MED ORDER — CHLORHEXIDINE GLUCONATE 0.12% ORAL RINSE (MEDLINE KIT)
15.0000 mL | Freq: Two times a day (BID) | OROMUCOSAL | Status: DC
  Administered 2020-06-27 – 2020-06-29 (×4): 15 mL via OROMUCOSAL

## 2020-06-27 MED ORDER — SODIUM CHLORIDE 0.9 % IV SOLN: INTRAVENOUS | Status: DC | PRN

## 2020-06-27 MED ORDER — DOCUSATE SODIUM 100 MG PO CAPS: 100.0000 mg | ORAL_CAPSULE | Freq: Two times a day (BID) | ORAL | Status: DC | PRN

## 2020-06-27 MED ORDER — ORAL CARE MOUTH RINSE
15.0000 mL | OROMUCOSAL | Status: DC
  Administered 2020-06-27 – 2020-06-29 (×15): 15 mL via OROMUCOSAL

## 2020-06-27 MED ORDER — FENTANYL CITRATE (PF) 100 MCG/2ML IJ SOLN
25.0000 ug | INTRAMUSCULAR | Status: DC | PRN
  Administered 2020-06-28 (×3): 50 ug via INTRAVENOUS
  Administered 2020-06-28: 25 ug via INTRAVENOUS
  Administered 2020-06-29: 50 ug via INTRAVENOUS
  Filled 2020-06-27 (×5): qty 2

## 2020-06-27 MED ORDER — SODIUM CHLORIDE 0.9 % IV BOLUS
750.0000 mL | Freq: Once | INTRAVENOUS | Status: AC
  Administered 2020-06-27: 750 mL via INTRAVENOUS

## 2020-06-27 MED ORDER — SODIUM CHLORIDE 0.9 % IV SOLN
1.0000 g | Freq: Every day | INTRAVENOUS | Status: DC
  Administered 2020-06-27: 1 g via INTRAVENOUS
  Filled 2020-06-27: qty 10
  Filled 2020-06-27: qty 1

## 2020-06-27 MED ORDER — NOREPINEPHRINE 4 MG/250ML-% IV SOLN
0.0000 ug/min | INTRAVENOUS | Status: DC
  Administered 2020-06-28: 19 ug/min via INTRAVENOUS
  Administered 2020-06-28: 23 ug/min via INTRAVENOUS
  Administered 2020-06-28: 26 ug/min via INTRAVENOUS
  Administered 2020-06-28: 36 ug/min via INTRAVENOUS
  Filled 2020-06-27 (×4): qty 250

## 2020-06-27 MED ORDER — DEXTROSE 50 % IV SOLN
INTRAVENOUS | Status: AC
  Administered 2020-06-27: 50 mL
  Filled 2020-06-27: qty 50

## 2020-06-27 MED ORDER — NOREPINEPHRINE 4 MG/250ML-% IV SOLN
INTRAVENOUS | Status: AC
  Administered 2020-06-27: 2 ug/min via INTRAVENOUS
  Filled 2020-06-27: qty 250

## 2020-06-27 NOTE — ED Notes (Signed)
Urology and Pulmonary paged for [facility].

## 2020-06-27 NOTE — Progress Notes (Signed)
Date and time results received: 06/27/20 1830  Received from [facility] Respiratory Therapist  Test: ABG Critical Value: pH 6.84, pCO2 11, Bicarb not reportable  Name of Provider Notified: MD Byrum with PCCM   Orders Received? Or Actions Taken? HD Cath placed, CRRT being prepared

## 2020-06-27 NOTE — ED Notes (Signed)
Patient to CT at this time

## 2020-06-27 NOTE — ED Provider Notes (Addendum)
[facility] Provider Note   CSN: [id number] Arrival date & time: 06/27/20  1212     History Chief Complaint  Patient presents with  . Altered Mental Status    Chase [name] is a 55 y.o. male.  HPI Patient is a 55 year old male with a history of CVA, CKD, MI, COVID-19, Hodgkin's lymphoma, who presents the emergency department due to altered mental status.  Per nursing staff as well as EMS, patient was found unresponsive this morning by his son.  Apparently EMS was called out to the house multiple times yesterday for a fall and then another time for possible oxycodone overdose.  He was evaluated multiple times in the field but never transported to the emergency department yesterday.  Patient is responsive to only painful stimuli.  Intermittently combative.    Patient's son is now at bedside.  He states that he lives 4 hours away and is his closest relative.  He states that his father has been having intermittent episodes similar to the one over the past 24 hours.  He states he is following more frequently.  He is having more difficulty with his ADLs.  He states that he is unsure of his father is taking his meds appropriately, including his Percocet.  He states that this morning he found around 10 AM on the floor.    Level 5 caveat due to unresponsiveness.  Per records, patient was recently admitted on December 31 of 2021 for sepsis as well as acute kidney failure.  He had nephrostomy tubes in place bilaterally that were pulled out at the time.  Nephrology was consulted during that admission and patient underwent hemodialysis.  They placed bilateral percutaneous nephrostomy tubes.  On January 6 patient was found to have urinary obstruction secondary to a pelvic mass with subsequent bilateral hydronephrosis in the setting of bilateral ureteral stents.  They removed his nephrostomy tubes at that time.  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  On  January 18 he underwent a diverting loop colostomy by [name].    Past Medical History:  Diagnosis Date  . Cervical radiculopathy    with left sided weakness  . Chemical exposure    service related injury  . Chronic pain   . CKD (chronic kidney disease), stage IV ([place]) 10/17/2019  . COVID-19   . GERD (gastroesophageal reflux disease)   . GIB (gastrointestinal bleeding)   . Heart attack ([place])   . History of CVA (cerebrovascular accident) 12/31/2019  . 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 History of stomach ulcers 05/19/2013   Formatting of this note might be different from the original. Attributed to chronic ibuprofen use  . Hodgkin lymphoma, unspecified, unspecified site ([place]) 12/10/2012   Formatting of this note might be different from the original. In remission.  . Hypokalemia   . Hypomagnesemia   . PTSD (post-traumatic stress disorder)   . Renal disorder    CKD  . Stroke ([place])   . Urinary retention    due to bladder outlet obstruction    Patient Active Problem List   Diagnosis Date Noted  . Kidney failure 06/27/2020  . Physical deconditioning   . Cachexia ([place]) 04/20/2020  . Personality disorder  04/19/2020  . Diverticulitis of sigmoid colon with abscess s/p colectomy/colostomy Jeanette Caprice) 04/18/2020 04/18/2020  . 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[facility]) 04/18/2020  . Acute respiratory failure with hypoxia ([place])   . DNR (do not resuscitate) discussion   . Palliative care by specialist   .  Adult failure to thrive   . Sepsis with acute renal failure without septic shock ([place]) 03/31/2020  . Incisional hernia - epigastric 01/04/2020  . Chronic narcotic use 01/02/2020  . Anemia in chronic kidney disease 01/02/2020  . ATN (acute tubular necrosis) ([place]) 12/31/2019  . Candida UTI 12/31/2019  . Electrolyte abnormality 12/31/2019  . Increased anion gap metabolic acidosis 53/97/6734  . Microcytic anemia 12/31/2019  . ABLA (acute blood loss anemia) 12/31/2019  . Anxiety and depression  12/31/2019  . History of CVA (cerebrovascular accident) 12/31/2019  . BMI less than 19,adult 12/31/2019  . Bilateral ureteral obstruction s/p perc nephrosotmy & stenting 12/30/2019  . AKI (acute kidney injury) ([place]) 12/24/2019  . Pelvic abscess with colovescial fistula 11/15/2019  . E. coli UTI (urinary tract infection) 11/15/2019  . PTSD (post-traumatic stress disorder)   . Chemical exposure   . Cervical radiculopathy   . Septic shock ([place]) 11/08/2019  . Protein-calorie malnutrition, severe 10/23/2019  . Cerebral embolism with cerebral infarction 10/18/2019  . Acute left-sided weakness 10/17/2019  . CKD (chronic kidney disease) stage 5, GFR less than 15 ml/min ([facility]) 10/17/2019  . Chronic pain 10/17/2019  . Fall 10/17/2019  . Pseudomonas urinary tract infection 08/24/2019  . BPH (benign prostatic hyperplasia) 08/24/2019  . Acute kidney failure ([place]) 08/24/2019  . Anemia 08/24/2019  . Dyspnea 08/24/2019  . History of COVID-19 08/24/2019  . History of elevated PSA 08/24/2019  . 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 Hyponatremia 08/24/2019  . Metabolic acidosis 19/37/9024  . Skin lesion of neck 08/24/2019  . Toxic metabolic encephalopathy 09/73/5329  . Cervical spondylosis without myelopathy 06/19/2016  . Neck pain 09/04/2015  . History of stomach ulcers 05/19/2013  . Male erectile dysfunction 05/19/2013  . Hodgkin lymphoma, unspecified, unspecified site ([place]) 12/10/2012  . Iron deficiency anemia 12/10/2012  . Pernicious anemia 12/10/2012  . Arthralgia of multiple sites 04/20/2012  . Postlaminectomy syndrome, not elsewhere classified 04/20/2012    Past Surgical History:  Procedure Laterality Date  . COLONOSCOPY    . COLONOSCOPY WITH PROPOFOL N/A 04/18/2020   Procedure: COLONOSCOPY WITH PROPOFOL;  Surgeon: Doran Stabler, MD;  Location: [place];  Service: Gastroenterology;  Laterality: N/A;  . CYSTOSCOPY W/ URETERAL STENT PLACEMENT N/A 11/14/2019   Procedure:  CYSTOSCOPY WITH RETROGRADE PYELOGRAM bilateral Wyvonnia Dusky STENT PLACEMENT left fulguration bladder . cystogram;  Surgeon: Raynelle Bring, MD;  Location: [facility];  Service: Urology;  Laterality: N/A;  . CYSTOSCOPY W/ URETERAL STENT PLACEMENT Bilateral 12/25/2019   Procedure: CYSTOSCOPY WITH bilateral  RETROGRADE PYELOGRAM/ leftURETERAL STENT PLACEMENT;  Surgeon: Lucas Mallow, MD;  Location: [facility];  Service: Urology;  Laterality: Bilateral;  . CYSTOSCOPY WITH URETEROSCOPY AND STENT PLACEMENT N/A 04/18/2020   Procedure: CYSTOSCOPY WITH BILATERAL URETERAL STENT CHANGE PLACEMENT;  Surgeon: Ardis Hughs, MD;  Location: [place];  Service: Urology;  Laterality: N/A;  . EPIGASTRIC HERNIA REPAIR N/A 04/18/2020   Procedure: HERNIA REPAIR EPIGASTRIC ADULT;  Surgeon: Michael Boston, MD;  Location: [place];  Service: General;  Laterality: N/A;  . GASTRIC BYPASS    . HERNIA REPAIR    . IR FLUORO GUIDE CV LINE RIGHT  04/01/2020  . IR NEPHRO TUBE REMOV/FL  04/25/2020  . IR NEPHROSTOMY PLACEMENT LEFT  12/27/2019  . IR NEPHROSTOMY PLACEMENT LEFT  04/01/2020  . IR NEPHROSTOMY PLACEMENT RIGHT  11/16/2019  . IR NEPHROSTOMY PLACEMENT RIGHT  12/27/2019  . IR NEPHROSTOMY PLACEMENT RIGHT  04/01/2020  . IR SINUS/FIST TUBE CHK-NON GI  12/03/2019  . IR URETERAL STENT PLACEMENT EXISTING ACCESS RIGHT  12/08/2019  . IR URETERAL STENT PLACEMENT EXISTING ACCESS RIGHT  01/26/2020  . IR US GUIDE VASC ACCESS RIGHT  04/01/2020  . LAPAROSCOPIC LOOP COLOSTOMY N/A 04/18/2020   Procedure: LAPAROSCOPIC SIGMOID COLOTOMY WITH COLOSTOMY;  Surgeon: Michael Boston, MD;  Location: [place];  Service: General;  Laterality: N/A;       Family History  Problem Relation Age of Onset  . Renal cancer Father        mets to liver and lungs and pancreas  . Heart disease Father   . Heart disease Paternal Grandfather   . Colon cancer Maternal Grandmother   . Colon cancer Paternal Uncle   . Stomach cancer Neg Hx   . Esophageal cancer Neg Hx   . Pancreatic  cancer Neg Hx     Social History   Tobacco Use  . Smoking status: Never Smoker  . Smokeless tobacco: Current User  Vaping Use  . Vaping Use: Never used  Substance Use Topics  . Alcohol use: Never  . Drug use: Never    Home Medications Prior to Admission medications   Medication Sig Start Date End Date Taking? Authorizing Provider  gabapentin (NEURONTIN) 300 MG capsule Take 1 capsule (300 mg total) by mouth at bedtime. 05/03/20  Yes Samella Parr, NP  oxyCODONE (OXY IR/ROXICODONE) 5 MG immediate release tablet Take 1-2 tablets (5-10 mg total) by mouth every 4 (four) hours as needed for moderate pain. 05/03/20  Yes Samella Parr, NP  pantoprazole (PROTONIX) 20 MG tablet Take 1 tablet (20 mg total) by mouth 2 (two) times daily. 05/03/20  Yes Samella Parr, NP  acetaminophen (TYLENOL) 500 MG tablet Take 1 tablet (500 mg total) by mouth every 6 (six) hours as needed for mild pain, fever or headache. 05/01/20   Samella Parr, NP  calcium carbonate (TUMS - DOSED IN MG ELEMENTAL CALCIUM) 500 MG chewable tablet Chew 1 tablet (200 mg of elemental calcium total) by mouth 3 (three) times daily. 05/01/20   Samella Parr, NP  Cholecalciferol (VITAMIN D3) 20 MCG (800 UNIT) TABS Take 800 Units by mouth daily. 05/03/20   Samella Parr, NP  cyanocobalamin (,VITAMIN B-12,) 1000 MCG/ML injection Inject 1,000 mcg into the muscle every 30 (thirty) days.  12/28/13   [provider]  DULoxetine (CYMBALTA) 30 MG capsule Take 1-2 capsules (30-60 mg total) by mouth See admin instructions. Take 60 mg in the morning and 30 mg in the evening 05/03/20   Samella Parr, NP  gabapentin (NEURONTIN) 100 MG capsule Take 2 capsules (200 mg total) by mouth daily. Patient taking differently: Take 100 mg by mouth 3 (three) times daily. 01/09/20 03/03/20  Elodia Florence., MD  gabapentin (NEURONTIN) 100 MG capsule Take 2 capsules (200 mg total) by mouth daily. 05/03/20 06/02/20  Samella Parr, NP   hydrOXYzine (ATARAX/VISTARIL) 10 MG tablet Take 1 tablet (10 mg total) by mouth 3 (three) times daily as needed for itching or anxiety. 05/03/20   Samella Parr, NP  Multiple Vitamin (MULTIVITAMIN WITH MINERALS) TABS tablet Take 1 tablet by mouth daily. 05/03/20   Samella Parr, NP  polycarbophil (FIBERCON) 625 MG tablet Take 1 tablet (625 mg total) by mouth 2 (two) times daily. 05/01/20   Samella Parr, NP  sodium bicarbonate 650 MG tablet Take 1 tablet (650 mg total) by mouth 2 (two) times daily. 05/03/20   Samella Parr,  NP  tiZANidine (ZANAFLEX) 2 MG tablet Take 1 tablet (2 mg total) by mouth every 8 (eight) hours as needed for muscle spasms. 05/03/20   Samella Parr, NP    Allergies    Nsaids and Ciprofloxacin  Review of Systems   Review of Systems  Unable to perform ROS: Patient unresponsive   Physical Exam Updated Vital Signs BP 111/65   Pulse (!) 59   Temp (!) 93.4 F (34.1 C) (Rectal)   Resp 10   Ht 5' 6"  (1.676 m)   Wt 58.4 kg   SpO2 100%   BMI 20.78 kg/m   Physical Exam Vitals and nursing note reviewed.  Constitutional:      General: He is in acute distress.     Appearance: He is ill-appearing. He is not toxic-appearing or diaphoretic.     Comments: Extremely cachectic adult male.  Pallor.  Unresponsive.    HENT:     Head: Normocephalic and atraumatic.     Right Ear: External ear normal.     Left Ear: External ear normal.     Nose: Nose normal.     Comments: Dried blood noted to the end of the nose.    Mouth/Throat:     Mouth: Mucous membranes are moist.     Pharynx: Oropharynx is clear. No oropharyngeal exudate or posterior oropharyngeal erythema.  Eyes:     General: No scleral icterus.       Right eye: No discharge.        Left eye: No discharge.     Conjunctiva/sclera: Conjunctivae normal.     Pupils: Pupils are equal, round, and reactive to light.     Comments: Pupils are 3 mm bilaterally.  Responsive to light.  Cardiovascular:     Rate and  Rhythm: Normal rate and regular rhythm.     Pulses: Normal pulses.     Heart sounds: Normal heart sounds. No murmur heard. No friction rub. No gallop.   Pulmonary:     Effort: Pulmonary effort is normal. No respiratory distress.     Breath sounds: Normal breath sounds. No stridor. No wheezing, rhonchi or rales.  Abdominal:     General: Abdomen is flat.     Palpations: Abdomen is soft.     Comments: Abdomen is flat and soft.  Genitourinary:    Comments: Foley catheter in place. Musculoskeletal:        General: Swelling present. Normal range of motion.     Cervical back: Normal range of motion and neck supple.     Comments: Large hematoma noted to the left lateral hip.  Skin:    General: Skin is warm and dry.     Findings: Bruising present.  Neurological:     Comments: Patient responsive to painful stimuli.  Does not answer questions.  Does not follow commands.  Psychiatric:        Mood and Affect: Mood normal.        Behavior: Behavior normal.    ED Results / Procedures / Treatments   Labs (all labs ordered are listed, but only abnormal results are displayed) Labs Reviewed  COMPREHENSIVE METABOLIC PANEL - Abnormal; Notable for the following components:      Result Value   Chloride 113 (*)    CO2 <7 (*)    BUN 102 (*)    Creatinine, Ser 10.66 (*)    Calcium 7.6 (*)    Albumin 2.9 (*)    AST 77 (*)    ALT  84 (*)    Alkaline Phosphatase 178 (*)    GFR, Estimated 5 (*)    All other components within normal limits  CBC WITH DIFFERENTIAL/PLATELET - Abnormal; Notable for the following components:   WBC 13.2 (*)    RBC 2.92 (*)    Hemoglobin 9.3 (*)    HCT 28.5 (*)    RDW 16.7 (*)    nRBC 1.8 (*)    Neutro Abs 11.7 (*)    Lymphs Abs 0.5 (*)    Abs Immature Granulocytes 0.39 (*)    All other components within normal limits  CK - Abnormal; Notable for the following components:   Total CK 2,210 (*)    All other components within normal limits  BLOOD GAS, ARTERIAL -  Abnormal; Notable for the following components:   pH, Arterial <6.8 (*)    pCO2 arterial <19.0 (*)    pO2, Arterial 185 (*)    All other components within normal limits  RESP PANEL BY RT-PCR (FLU A&B, COVID) ARPGX2  CULTURE, BLOOD (ROUTINE X 2)  CULTURE, BLOOD (ROUTINE X 2)  ETHANOL  LACTIC ACID, PLASMA  URINALYSIS, ROUTINE W REFLEX MICROSCOPIC  RAPID URINE DRUG SCREEN, HOSP PERFORMED  CBG MONITORING, ED   EKG EKG Interpretation  Date/Time:  Tuesday June 27 2020 12:53:02 EDT Ventricular Rate:  57 PR Interval:    QRS Duration: 109 QT Interval:  556 QTC Calculation: 542 R Axis:   81 Text Interpretation: Sinus rhythm Ventricular premature complex Abnrm T, consider ischemia, anterolateral lds Minimal ST elevation, inferior leads Prolonged QT interval Artifact in lead(s) II III aVL aVF V5 V6 and baseline wander in lead(s) V3 Confirmed by Davonna Belling [phone]) on 06/27/2020 2:26:14 PM   Radiology CT Head Wo Contrast  Result Date: 06/27/2020 CLINICAL DATA:  Fall.  Altered mental status. EXAM: CT HEAD WITHOUT CONTRAST TECHNIQUE: Contiguous axial images were obtained from the base of the skull through the vertex without intravenous contrast. COMPARISON:  CT head dated October 18, 2019. FINDINGS: Brain: No evidence of acute infarction, hemorrhage, hydrocephalus, extra-axial collection or mass lesion/mass effect. Small area of encephalomalacia in the right occipital lobe from prior infarct. Vascular: Atherosclerotic vascular calcification of the carotid siphons. No hyperdense vessel. Skull: Normal. Negative for fracture or focal lesion. Sinuses/Orbits: No acute finding. Other: None. IMPRESSION: 1. No acute intracranial abnormality. 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 Electronically Signed   By: Titus Dubin M.D.   On: 06/27/2020 13:44   US Renal  Result Date: 06/27/2020 CLINICAL DATA:  Acute kidney failure. History of nephrostomy tubes. Unresponsive. EXAM: RENAL / URINARY TRACT  ULTRASOUND COMPLETE COMPARISON:  Most recent abdominal imaging 03/31/2020 FINDINGS: Right Kidney: Renal measurements: 7.4 x 4.1 x 4.9 cm = volume: 77 mL. Moderate hydronephrosis. Stent is present in the right renal pelvis, presumed ureteral stent. There is thinning of the renal parenchyma with increased echogenicity. No focal lesion or evidence of stone. Left Kidney: Renal measurements: 8.8 x 5.2 x 5.2 cm = volume: 123 mL. Moderate hydronephrosis. The ureteral stent is faintly visualized, with tip in the lower renal pelvis. Mild thinning of the renal parenchyma and increased echogenicity. An 8 mm echogenic focus in the lateral kidney may represent a stone or air bubble when no evidence of focal mass. Compared with prior exam. Bladder: Foley catheter decompresses the bladder. Bladder appears thick walled, but is not well assessed. Other: None. IMPRESSION: 1. Moderate bilateral hydroureteronephrosis, similar in degree to prior imaging. 2. Right ureteral stent with proximal pigtail  in the renal pelvis. The left ureteral stent is faintly visualized in the low pelvis. 3. Increased renal echogenicity consistent with chronic medical renal disease. 4. Echogenic focus in the left kidney may represent a nonobstructing stone or air bubble when compared with prior CT. Electronically Signed   By: Keith Rake M.D.   On: 06/27/2020 15:21   DG Chest Portable 1 View  Result Date: 06/27/2020 CLINICAL DATA:  Agitated, found unresponsive earlier in the day. EXAM: PORTABLE CHEST 1 VIEW COMPARISON:  Chest x-ray from April 02, 2020 FINDINGS: Trachea midline. Cardiomediastinal contours and hilar structures are normal. Lungs are clear.  No sign of effusion on frontal view. EKG leads project over the chest. On limited assessment no acute skeletal process IMPRESSION: No acute cardiopulmonary disease. Electronically Signed   By: Zetta Bills M.D.   On: 06/27/2020 13:55   DG HIP UNILAT WITH PELVIS 2-3 VIEWS LEFT  Result Date:  06/27/2020 CLINICAL DATA:  Found down.  Agitation. EXAM: DG HIP (WITH OR WITHOUT PELVIS) 2-3V LEFT COMPARISON:  05/01/2020 FINDINGS: There is no evidence of hip fracture or dislocation. Hip joint spaces are preserved. Decreased offset of the bilateral femoral head-neck junctions. No suspicious bony lesion. Bilateral nephroureteral stents remain in place. IMPRESSION: Negative. Electronically Signed   By: Davina Poke D.O.   On: 06/27/2020 13:55    Procedures .Critical Care Performed by: Rayna Sexton, PA-C Authorized by: Rayna Sexton, PA-C   Critical care provider statement:    Critical care time (minutes):  60   Critical care was necessary to treat or prevent imminent or life-threatening deterioration of the following conditions:  Sepsis, shock and renal failure   Critical care was time spent personally by me on the following activities:  Discussions with consultants, evaluation of patient's response to treatment, examination of patient, ordering and performing treatments and interventions, ordering and review of laboratory studies, ordering and review of radiographic studies, pulse oximetry, re-evaluation of patient's condition, obtaining history from patient or surrogate and review of old charts     Medications Ordered in ED Medications  sodium bicarbonate 150 mEq in sterile water 1,000 mL infusion ( Intravenous New Bag/Given 06/27/20 1548)  sodium chloride 0.9 % bolus 750 mL (has no administration in time range)  sodium chloride 0.9 % bolus 1,000 mL (0 mLs Intravenous Stopped 06/27/20 1456)  cefTRIAXone (ROCEPHIN) 2 g in sodium chloride 0.9 % 100 mL IVPB (0 g Intravenous Stopped 06/27/20 1551)    ED Course  I have reviewed the triage vital signs and the nursing notes.  Pertinent labs & imaging results that were available during my care of the patient were reviewed by me and considered in my medical decision making (see chart for details).  Clinical Course as of 06/27/20 1603   Tue Jun 27, 2020  1357 Patient's blood pressure down in the low 11B systolically.  We will give patient a liter of warmed IV saline. [LJ]  1414 CO2(!): <7 [LJ]  1415 Creatinine(!): 10.66 [LJ]  1415 CK Total(!): 2,210 [LJ]  1433 BUN(!): 102 [LJ]  1434 pH, Arterial(!!): <6.8 [LJ]  1500 I spoke to nephrology regarding the patient.  They feel that this is likely obstruction.  Recommended that we start him on a bicarb drip.  Also recommended that we could give patient IV Rocephin for likely urinary source of infection.  Continue to monitor.  After renal ultrasound, recommends reconsulting him. [LJ]  1478 I spoke to nephrology once again regarding patient's ultrasound.  Feels the patient could  likely need CRRT if he does need dialysis.  Recommends discussing with urology as well as critical care.  Patient will likely need to be transferred to [facility]. [LJ]  [place] I spoke to Dr. Gloriann Loan with urology.  Requests a Noncon CT of the abdomen and pelvis.  We will order this now.  Requests ICU admission at [facility]. [LJ]    Clinical Course User Index [LJ] Rayna Sexton, PA-C   MDM Rules/Calculators/A&P                          Pt is a 55 y.o. male who presents the emergency department obtunded with what appears to be acute renal failure.  Labs: CBC with a white count of 13.2, hemoglobin of 9.3, neutrophils of 11.7 Ethanol less than 10. CK of 2210. CMP with a chloride of 113, CO2 less than 7, BUN of 102, creatinine of 10.66, calcium of 7.6, alk phos of 178, GFR of 5. Lactic acid of 0.9. Respiratory panel is negative. ABG showing a pH less than 6.8 as well as a CO2 less than 19.  Imaging: Please see imaging notes above.  I, Rayna Sexton, PA-C, personally reviewed and evaluated these images and lab results as part of my medical decision-making.  It appears patient had a admission in early January with similar presentation.  He had nephrostomy tubes at that time which were taken out and  ureteral stents were placed bilaterally.  Patient has no urine noted in his Foley bag.  Ultrasound showing bilateral hydronephrosis.    Patient discussed with nephrology who also evaluated patient's ultrasound findings.  Recommended a bicarb drip as well as IV Rocephin for likely UTI as well.  These were both ordered.  They note that if patient does need dialysis he will likely need CRRT.  [facility] at [facility].  I then discussed patient with Dr. Gloriann Loan with urology.  Recommends ICU admission, ideally at [facility] if possible.  Patient will possibly need nephrostomy tubes.  Requested that we also obtain a noncontrast CT of the abdomen and pelvis.  Will order this.  Patient then discussed with Dr. Halford Chessman with critical care.  Agrees with our treatment plan and also recommends that we continue to monitor his airway closely.  He is going to discuss this patient with the ICU team at [facility] and we will initiate transfer to the ICU at this time.    Note: Portions of this report may have been transcribed using voice recognition software. Every effort was made to ensure accuracy; however, inadvertent computerized transcription errors may be present.   Final Clinical Impression(s) / ED Diagnoses Final diagnoses:  Sepsis with acute renal failure, due to unspecified organism, unspecified acute renal failure type, unspecified whether septic shock present [facility])   Rx / DC Orders ED Discharge Orders    None       Rayna Sexton, PA-C 06/27/20 1603    Rayna Sexton, PA-C 06/27/20 1613    Rayna Sexton, PA-C 06/27/20 1900    Davonna Belling, MD 06/28/20 [phone]

## 2020-06-27 NOTE — Consult Note (Signed)
Reason for Consult: Renal failure Referring Physician: Dr. 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 - Likely in ATN from sepsis with possibly rhabdo superimposed but CK is not that impressive unless it's upward trending. - Agree that CRRT will be necessary as a temporizing measure; will initiate immediately with no UF and assess daily. - Avoid nephrotoxins and agree with holding the neurontin and opioids. - Overall his lack of functionality, physical deconditioning, and severe malnutrition makes him a poor  dialysis candidate but certainly can have more discussion in the future when that subject needs to be broached with the family.  2) Sepsis on Rocephin with cultures pending; there are florid WBC's on urinalysis.  3) Severe metabolic acidosis: will place on CRRT; OK to continue the HCO3 gtt  as well until  acidosis  improves with the CRRT.  4) Anemia of CKD: no IV iron with possible infection; transfuse as needed.   HPI: Chase [name] is an 55 y.o. male h/o HL (2010-2011), gastric bypass surgery complicated with a bleeding anastomotic ulcer and a colovesicular fistula w/ h/o intraperitoneal abscess. 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 Patient had a urinary obstruction secondary to a pelvic mass and subsequently had a diverting loop colostomy by [name]; CT cystogram on 1/31 showed no e/o colovesicular fistula. On review of chart he has  been having more difficulty with ADL's (per son) and does use Percocet routinely. Patient's baseline creatinine appears to be in the 4.5-5.1 range. He was found down by family with unclear time duration with a h/o of narcotic use. He was then found in [facility] ED to be altered with a metabolic acidosis with acute on chronic renal failure, elevated CK, hypotension started on IV bicarbonate, fluids resuscitation and abx. ALso found to have a profound acidosis with a pH<6.8.  Influenza and COVID tests were neg. U/A showed >50 WBC's and 6-10 RBC's. CT showed Bilateral ureteral stents in grossly good position, foley in the bladder and no hydronephrosis but renal atrophy noted.  Stable prostatic enlargement.  ROS Pertinent items are noted in HPI.  Chemistry and CBC: Creatinine, Ser  Date/Time Value Ref Range Status  06/27/2020 12:37 PM 10.66 (H) 0.61 - 1.24 mg/dL Final  04/26/2020 12:22 AM 4.42 (H) 0.61 - 1.24 mg/dL Final  04/25/2020 03:08 AM 4.70 (H) 0.61 - 1.24 mg/dL Final  04/24/2020 02:28 AM 5.06 (H) 0.61 - 1.24 mg/dL Final  04/23/2020 01:44 AM 4.57 (H) 0.61 - 1.24 mg/dL Final  04/22/2020 12:33 AM 4.46 (H) 0.61 - 1.24 mg/dL Final  04/21/2020 06:28 AM 4.31 (H) 0.61 - 1.24 mg/dL Final  04/19/2020 12:57 AM 4.64 (H) 0.61 - 1.24 mg/dL Final  04/18/2020 12:05 PM 4.80 (H) 0.61 - 1.24 mg/dL Final  04/17/2020 03:07 AM 4.97 (H) 0.61 - 1.24 mg/dL Final  04/12/2020 03:35 AM 5.15 (H) 0.61 - 1.24 mg/dL Final  04/11/2020 03:34 AM 5.08 (H) 0.61 - 1.24 mg/dL Final  04/10/2020 03:21  AM 5.34 (H) 0.61 - 1.24 mg/dL Final  04/09/2020 02:50 AM 5.09 (H) 0.61 - 1.24 mg/dL Final  04/08/2020 04:22 AM 4.99 (H) 0.61 - 1.24 mg/dL Final  04/07/2020 01:15 AM 4.86 (H) 0.61 - 1.24 mg/dL Final  04/06/2020 03:52 AM 4.74 (H) 0.61 - 1.24 mg/dL Final  04/05/2020 01:58 AM 4.43 (H) 0.61 - 1.24 mg/dL Final  04/04/2020 04:47 AM 4.22 (H) 0.61 - 1.24 mg/dL Final    Comment:    DELTA CHECK NOTED  04/03/2020 04:01 AM 8.39 (H) 0.61 - 1.24  mg/dL Final  04/02/2020 03:12 AM 7.58 (H) 0.61 - 1.24 mg/dL Final    Comment:    DELTA CHECK NOTED  04/01/2020 08:41 AM 15.34 (H) 0.61 - 1.24 mg/dL Final  04/01/2020 02:13 AM 15.49 (H) 0.61 - 1.24 mg/dL Final  03/31/2020 02:20 PM 16.69 (H) 0.61 - 1.24 mg/dL Final  01/26/2020 10:50 AM 5.76 (H) 0.61 - 1.24 mg/dL Final  01/08/2020 05:18 AM 5.62 (H) 0.61 - 1.24 mg/dL Final  01/07/2020 04:49 AM 5.99 (H) 0.61 - 1.24 mg/dL Final  01/06/2020 05:07 AM 6.19 (H) 0.61 - 1.24 mg/dL Final  01/05/2020 05:58 AM 5.94 (H) 0.61 - 1.24 mg/dL Final  01/04/2020 04:53 AM 5.74 (H) 0.61 - 1.24 mg/dL Final  01/03/2020 05:06 AM 6.42 (H) 0.61 - 1.24 mg/dL Final  01/02/2020 04:27 AM 6.27 (H) 0.61 - 1.24 mg/dL Final  01/01/2020 04:30 AM 6.54 (H) 0.61 - 1.24 mg/dL Final  12/31/2019 05:28 AM 6.89 (H) 0.61 - 1.24 mg/dL Final  12/30/2019 06:03 AM 7.77 (H) 0.61 - 1.24 mg/dL Final  12/29/2019 04:12 AM 7.98 (H) 0.61 - 1.24 mg/dL Final  12/28/2019 05:03 AM 8.47 (H) 0.61 - 1.24 mg/dL Final  12/27/2019 04:50 AM 7.98 (H) 0.61 - 1.24 mg/dL Final  12/26/2019 05:53 AM 7.65 (H) 0.61 - 1.24 mg/dL Final  12/25/2019 04:34 PM 7.55 (H) 0.61 - 1.24 mg/dL Final  12/25/2019 02:17 AM 7.65 (H) 0.61 - 1.24 mg/dL Final  12/24/2019 10:23 AM 7.19 (H) 0.61 - 1.24 mg/dL Final  12/08/2019 12:58 PM 2.78 (H) 0.61 - 1.24 mg/dL Final  11/19/2019 10:30 AM 2.41 (H) 0.61 - 1.24 mg/dL Final  11/19/2019 03:08 AM 2.56 (H) 0.61 - 1.24 mg/dL Final  11/18/2019 03:14 AM 2.64 (H) 0.61 - 1.24 mg/dL Final  11/17/2019 11:09 AM 2.66 (H) 0.61 - 1.24 mg/dL Final  11/15/2019 03:41 AM 2.53 (H) 0.61 - 1.24 mg/dL Final  11/14/2019 03:33 AM 2.52 (H) 0.61 - 1.24 mg/dL Final  11/13/2019 03:22 AM 2.57 (H) 0.61 - 1.24 mg/dL Final  11/12/2019 03:58 AM 2.82 (H) 0.61 - 1.24 mg/dL Final  11/11/2019 08:21 AM 3.02 (H) 0.61 - 1.24 mg/dL Final   Recent Labs  Lab 06/27/20 1237  NA 139  K 5.1  CL 113*  CO2 <7*  GLUCOSE 86  BUN 102*  CREATININE 10.66*  CALCIUM 7.6*    Recent Labs  Lab 06/27/20 1237  WBC 13.2*  NEUTROABS 11.7*  HGB 9.3*  HCT 28.5*  MCV 97.6  PLT 234   Liver Function Tests: Recent Labs  Lab 06/27/20 1237  AST 77*  ALT 84*  ALKPHOS 178*  BILITOT 1.1  PROT 6.6  ALBUMIN 2.9*   No results for input(s): LIPASE, AMYLASE in the last 168 hours. No results for input(s): AMMONIA in the last 168 hours. Cardiac Enzymes: Recent Labs  Lab 06/27/20 1237  CKTOTAL 2,210*   Iron Studies: No results for input(s): IRON, TIBC, TRANSFERRIN, FERRITIN in the last 72 hours. PT/INR: @LABRCNTIP (inr:5)  Xrays/Other Studies: ) Results for orders placed or performed during the hospital encounter of 06/27/20 (from the past 48 hour(s))  Comprehensive metabolic panel     Status: Abnormal   Collection Time: 06/27/20 12:37 PM  Result Value Ref Range   Sodium 139 135 - 145 mmol/L   Potassium 5.1 3.5 - 5.1 mmol/L   Chloride 113 (H) 98 - 111 mmol/L   CO2 <7 (L) 22 - 32 mmol/L    Comment: REPEATED TO VERIFY   Glucose, Bld 86 70 - 99 mg/dL    Comment: Glucose reference range applies only to samples taken after fasting for at least 8 hours.   BUN 102 (H) 6 - 20 mg/dL    Comment: RESULTS CONFIRMED BY MANUAL DILUTION   Creatinine, Ser 10.66 (H) 0.61 - 1.24 mg/dL   Calcium 7.6 (L) 8.9 - 10.3 mg/dL   Total Protein 6.6 6.5 - 8.1 g/dL   Albumin 2.9 (L) 3.5 - 5.0 g/dL   AST 77 (H) 15 - 41 U/L   ALT 84 (H) 0 - 44 U/L   Alkaline Phosphatase 178 (H) 38 - 126 U/L   Total Bilirubin 1.1 0.3 - 1.2 mg/dL   GFR, Estimated 5 (L) >60 mL/min    Comment: (NOTE) Calculated using the CKD-EPI Creatinine Equation (2021)    Anion gap NOT CALCULATED 5 - 15    Comment: Performed at [facility], [address], [place], [place] [address]  Ethanol     Status: None   Collection Time: 06/27/20 12:37 PM  Result Value Ref Range   Alcohol, Ethyl (B) <10 <10 mg/dL    Comment: (NOTE) Lowest detectable limit for serum alcohol is 10 mg/dL.  For medical purposes  only. Performed at [facility], [address], [place], [place] [address]   CBC with Differential     Status: Abnormal   Collection Time: 06/27/20 12:37 PM  Result Value Ref Range   WBC 13.2 (H) 4.0 - 10.5 K/uL   RBC 2.92 (L) 4.22 - 5.81 MIL/uL   Hemoglobin 9.3 (L) 13.0 - 17.0 g/dL   HCT 28.5 (L) 39.0 - 52.0 %   MCV 97.6 80.0 - 100.0 fL   MCH 31.8 26.0 - 34.0 pg   MCHC 32.6 30.0 - 36.0 g/dL   RDW 16.7 (H) 11.5 - 15.5 %   Platelets 234 150 - 400 K/uL   nRBC 1.8 (H) 0.0 - 0.2 %   Neutrophils Relative % 88 %   Neutro Abs 11.7 (H) 1.7 - 7.7 K/uL   Lymphocytes Relative 4 %   Lymphs Abs 0.5 (L) 0.7 - 4.0 K/uL   Monocytes Relative 4 %   Monocytes Absolute 0.5 0.1 - 1.0 K/uL   Eosinophils Relative 0 %   Eosinophils Absolute 0.0 0.0 - 0.5 K/uL   Basophils Relative 1 %   Basophils Absolute 0.1 0.0 - 0.1 K/uL   Immature Granulocytes 3 %   Abs Immature Granulocytes 0.39 (H) 0.00 - 0.07 K/uL    Comment: Performed at [facility], [address], [place], [place] [address]  CK     Status: Abnormal   Collection Time: 06/27/20 12:37 PM  Result Value Ref Range   Total CK 2,210 (H) 49 - 397 U/L    Comment: Performed at [facility], [address], [place], [place] [address]  Lactic acid, plasma     Status: None   Collection Time: 06/27/20 12:38 PM  Result Value Ref Range   Lactic Acid, Venous 0.9 0.5 -  1.9 mmol/L    Comment: Performed at [facility], [address] [place], [place] [address]  CBG monitoring, ED     Status: None   Collection Time: 06/27/20 12:45 PM  Result Value Ref Range   Glucose-Capillary 80 70 - 99 mg/dL    Comment: Glucose reference range applies only to samples taken after fasting for at least 8 hours.  Culture, blood (routine x 2)     Status: None (Preliminary result)   Collection Time: 06/27/20 12:54 PM   Specimen: BLOOD  Result Value Ref Range   Specimen Description BLOOD BLOOD RIGHT ARM    Special Requests      Blood Culture adequate volume BOTTLES DRAWN  AEROBIC AND ANAEROBIC Performed at [facility], [address] [place], [place] [address]    Culture PENDING    Report Status PENDING   Culture, blood (routine x 2)     Status: None (Preliminary result)   Collection Time: 06/27/20 12:54 PM   Specimen: BLOOD  Result Value Ref Range   Specimen Description BLOOD BLOOD LEFT ARM    Special Requests      Blood Culture adequate volume BOTTLES DRAWN AEROBIC AND ANAEROBIC Performed at [facility], [address] [place], [place] [address]    Culture PENDING    Report Status PENDING   Resp Panel by RT-PCR (Flu A&B, Covid) Nasopharyngeal Swab     Status: None   Collection Time: 06/27/20  1:25 PM   Specimen: Nasopharyngeal Swab; Nasopharyngeal(NP) swabs in vial transport medium  Result Value Ref Range   SARS Coronavirus 2 by RT PCR NEGATIVE NEGATIVE    Comment: (NOTE) SARS-CoV-2 target nucleic acids are NOT DETECTED.  The SARS-CoV-2 RNA is generally detectable in upper respiratory specimens during the acute phase of infection. The lowest concentration of SARS-CoV-2 viral copies this assay can detect is 138 copies/mL. A negative result does not preclude SARS-Cov-2 infection and should not be used as the sole basis for treatment or other patient management decisions. A negative result may occur with  improper specimen collection/handling, submission of specimen other than nasopharyngeal swab, presence of viral mutation(s) within the areas targeted by this assay, and inadequate number of viral copies(<138 copies/mL). A negative result must be combined with clinical observations, patient history, and epidemiological information. The expected result is Negative.  Fact Sheet for Patients:  EntrepreneurPulse.com.au  Fact Sheet for Healthcare Providers:  IncredibleEmployment.be  This test is no t yet approved or cleared by the [place] FDA and  has been authorized for detection and/or diagnosis of  SARS-CoV-2 by FDA under an Emergency Use Authorization (EUA). This EUA will remain  in effect (meaning this test can be used) for the duration of the COVID-19 declaration under Section 564(b)(1) of the Act, 21 U.S.C.section 360bbb-3(b)(1), unless the authorization is terminated  or revoked sooner.       Influenza A by PCR NEGATIVE NEGATIVE   Influenza B by PCR NEGATIVE NEGATIVE    Comment: (NOTE) The Xpert Xpress SARS-CoV-2/FLU/RSV plus assay is intended as an aid in the diagnosis of influenza from Nasopharyngeal swab specimens and should not be used as a sole basis for treatment. Nasal washings and aspirates are unacceptable for Xpert Xpress SARS-CoV-2/FLU/RSV testing.  Fact Sheet for Patients: EntrepreneurPulse.com.au  Fact Sheet for Healthcare Providers: IncredibleEmployment.be  This test is not yet approved or cleared by the [place] FDA and has been authorized for detection and/or diagnosis of SARS-CoV-2 by FDA under an Emergency Use Authorization (EUA). This EUA  will remain in effect (meaning this test can be used) for the duration of the COVID-19 declaration under Section 564(b)(1) of the Act, 21 U.S.C. section 360bbb-3(b)(1), unless the authorization is terminated or revoked.  Performed at [facility], [address], [place], [place] [address]   Blood gas, arterial (at [facility])     Status: Abnormal   Collection Time: 06/27/20  2:34 PM  Result Value Ref Range   FIO2 32.00    pH, Arterial <6.8 (LL) 7.350 - 7.450    Comment: CRITICAL RESULT CALLED TO, READ BACK BY AND VERIFIED WITH: CUGINO M. AT 1447 ON [id number] BY THOMPSON S.    pCO2 arterial <19.0 (LL) 32.0 - 48.0 mmHg    Comment: CUGINO M. AT 1447 ON [id number] BY THOMPSON S. CORRECTED ON 03/29 AT 1452: PREVIOUSLY REPORTED AS 13.0 CUGINO M. AT 1447 ON [id number] BY THOMPSON S.    pO2, Arterial 185 (H) 83.0 - 108.0 mmHg   Bicarbonate NOT CALCULATED 20.0 - 28.0 mmol/L   O2  Saturation 96.8 %   Patient temperature 37.0    Allens test (pass/fail) PASS PASS    Comment: Performed at [facility], [address], [place], [place] [address]  Urinalysis, Routine w reflex microscopic Urine, Catheterized     Status: Abnormal   Collection Time: 06/27/20  3:44 PM  Result Value Ref Range   Color, Urine YELLOW YELLOW   APPearance TURBID (A) CLEAR   Specific Gravity, Urine 1.014 1.005 - 1.030   pH 6.0 5.0 - 8.0   Glucose, UA NEGATIVE NEGATIVE mg/dL   Hgb urine dipstick MODERATE (A) NEGATIVE   Bilirubin Urine NEGATIVE NEGATIVE   Ketones, ur 5 (A) NEGATIVE mg/dL   Protein, ur 100 (A) NEGATIVE mg/dL   Nitrite NEGATIVE NEGATIVE   Leukocytes,Ua MODERATE (A) NEGATIVE   RBC / HPF 6-10 0 - 5 RBC/hpf   WBC, UA >50 (H) 0 - 5 WBC/hpf   Bacteria, UA MANY (A) NONE SEEN   WBC Clumps PRESENT    Non Squamous Epithelial 11-20 (A) NONE SEEN    Comment: Performed at [facility], [address] [place], [place] [address]  Urine rapid drug screen (hosp performed)     Status: None   Collection Time: 06/27/20  3:44 PM  Result Value Ref Range   Opiates NONE DETECTED NONE DETECTED   Cocaine NONE DETECTED NONE DETECTED   Benzodiazepines NONE DETECTED NONE DETECTED   Amphetamines NONE DETECTED NONE DETECTED   Tetrahydrocannabinol NONE DETECTED NONE DETECTED   Barbiturates NONE DETECTED NONE DETECTED    Comment: (NOTE) DRUG SCREEN FOR MEDICAL PURPOSES ONLY.  IF CONFIRMATION IS NEEDED FOR ANY PURPOSE, NOTIFY LAB WITHIN 5 DAYS.  LOWEST DETECTABLE LIMITS FOR URINE DRUG SCREEN Drug Class                     Cutoff (ng/mL) Amphetamine and metabolites    1000 Barbiturate and metabolites    200 Benzodiazepine                 496 Tricyclics and metabolites     300 Opiates and metabolites        300 Cocaine and metabolites        300 THC                            50 Performed at [facility], [address] [place], [place] [address]    CT ABDOMEN  PELVIS WO CONTRAST  Result Date:  06/27/2020 CLINICAL DATA:  Acute renal failure. EXAM: CT ABDOMEN AND PELVIS WITHOUT CONTRAST TECHNIQUE: Multidetector CT imaging of the abdomen and pelvis was performed following the standard protocol without IV contrast. COMPARISON:  May 01, 2020. FINDINGS: Lower chest: No acute abnormality. Hepatobiliary: No focal liver abnormality is seen. Status post cholecystectomy. No biliary dilatation. Pancreas: Unremarkable. No pancreatic ductal dilatation or surrounding inflammatory changes. Spleen: Normal in size without focal abnormality. Adrenals/Urinary Tract: Adrenal glands appear normal. Mild bilateral renal atrophy is noted. Bilateral ureteral stents are noted in grossly good position. No hydronephrosis is noted. Foley catheter is noted within the urinary bladder. Stomach/Bowel: Status post gastric surgery. Colostomy is noted in left lower quadrant. The appendix appears normal. There is no evidence of bowel obstruction or inflammation. Vascular/Lymphatic: Aortic atherosclerosis. No enlarged abdominal or pelvic lymph nodes. Reproductive: Stable prostatic enlargement. Other: No abdominal wall hernia or abnormality. No abdominopelvic ascites. Musculoskeletal: No acute or significant osseous findings. IMPRESSION: Bilateral ureteral stents are in grossly good position. No hydronephrosis is noted. Stable prostatic enlargement. Aortic Atherosclerosis (ICD10-I70.0). Electronically Signed   By: Marijo Conception M.D.   On: 06/27/2020 16:16   CT Head Wo Contrast  Result Date: 06/27/2020 CLINICAL DATA:  [place].  Altered mental status. EXAM: CT HEAD WITHOUT CONTRAST TECHNIQUE: Contiguous axial images were obtained from the base of the skull through the vertex without intravenous contrast. COMPARISON:  CT head dated October 18, 2019. FINDINGS: Brain: No evidence of acute infarction, hemorrhage, hydrocephalus, extra-axial collection or mass lesion/mass effect. Small area of encephalomalacia in the right occipital lobe from prior  infarct. Vascular: Atherosclerotic vascular calcification of the carotid siphons. No hyperdense vessel. Skull: Normal. Negative for fracture or focal lesion. Sinuses/Orbits: No acute finding. Other: None. IMPRESSION: 1. No acute intracranial abnormality. 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 Electronically Signed   By: Titus Dubin M.D.   On: 06/27/2020 13:44   US Renal  Result Date: 06/27/2020 CLINICAL DATA:  Acute kidney failure. History of nephrostomy tubes. Unresponsive. EXAM: RENAL / URINARY TRACT ULTRASOUND COMPLETE COMPARISON:  Most recent abdominal imaging 03/31/2020 FINDINGS: Right Kidney: Renal measurements: 7.4 x 4.1 x 4.9 cm = volume: 77 mL. Moderate hydronephrosis. Stent is present in the right renal pelvis, presumed ureteral stent. There is thinning of the renal parenchyma with increased echogenicity. No focal lesion or evidence of stone. Left Kidney: Renal measurements: 8.8 x 5.2 x 5.2 cm = volume: 123 mL. Moderate hydronephrosis. The ureteral stent is faintly visualized, with tip in the lower renal pelvis. Mild thinning of the renal parenchyma and increased echogenicity. An 8 mm echogenic focus in the lateral kidney may represent a stone or air bubble when no evidence of focal mass. Compared with prior exam. Bladder: Foley catheter decompresses the bladder. Bladder appears thick walled, but is not well assessed. Other: None. IMPRESSION: 1. Moderate bilateral hydroureteronephrosis, similar in degree to prior imaging. 2. Right ureteral stent with proximal pigtail in the renal pelvis. The left ureteral stent is faintly visualized in the low pelvis. 3. Increased renal echogenicity consistent with chronic medical renal disease. 4. Echogenic focus in the left kidney may represent a nonobstructing stone or air bubble when compared with prior CT. Electronically Signed   By: Keith Rake M.D.   On: 06/27/2020 15:21   DG Chest Portable 1 View  Result Date: 06/27/2020 CLINICAL DATA:  Agitated,  found unresponsive earlier in the day. EXAM: PORTABLE CHEST 1 VIEW COMPARISON:  Chest x-ray from April 02, 2020 FINDINGS: Trachea midline. Cardiomediastinal contours and hilar structures are normal. Lungs are clear.  No sign of effusion on frontal view. EKG leads project over the chest. On limited assessment no acute skeletal process IMPRESSION: No acute cardiopulmonary disease. Electronically Signed   By: Zetta Bills M.D.   On: 06/27/2020 13:55   DG HIP UNILAT WITH PELVIS 2-3 VIEWS LEFT  Result Date: 06/27/2020 CLINICAL DATA:  Found down.  Agitation. EXAM: DG HIP (WITH OR WITHOUT PELVIS) 2-3V LEFT COMPARISON:  05/01/2020 FINDINGS: There is no evidence of hip fracture or dislocation. Hip joint spaces are preserved. Decreased offset of the bilateral femoral head-neck junctions. No suspicious bony lesion. Bilateral nephroureteral stents remain in place. IMPRESSION: Negative. Electronically Signed   By: Davina Poke D.O.   On: 06/27/2020 13:55    PMH:   Past Medical History:  Diagnosis Date  . Cervical radiculopathy    with left sided weakness  . Chemical exposure    service related injury  . Chronic pain   . CKD (chronic kidney disease), stage IV ([place]) 10/17/2019  . COVID-19   . GERD (gastroesophageal reflux disease)   . GIB (gastrointestinal bleeding)   . Heart attack ([place])   . History of CVA (cerebrovascular accident) 12/31/2019  . 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 History of stomach ulcers 05/19/2013   Formatting of this note might be different from the original. Attributed to chronic ibuprofen use  . Hodgkin lymphoma, unspecified, unspecified site ([place]) 12/10/2012   Formatting of this note might be different from the original. In remission.  . Hypokalemia   . Hypomagnesemia   . PTSD (post-traumatic stress disorder)   . Renal disorder    CKD  . Stroke ([place])   . Urinary retention    due to bladder outlet obstruction    PSH:   Past Surgical History:  Procedure Laterality  Date  . COLONOSCOPY    . COLONOSCOPY WITH PROPOFOL N/A 04/18/2020   Procedure: COLONOSCOPY WITH PROPOFOL;  Surgeon: Doran Stabler, MD;  Location: [facility];  Service: Gastroenterology;  Laterality: N/A;  . CYSTOSCOPY W/ URETERAL STENT PLACEMENT N/A 11/14/2019   Procedure: CYSTOSCOPY WITH RETROGRADE PYELOGRAM bilateral Wyvonnia Dusky STENT PLACEMENT left fulguration bladder . cystogram;  Surgeon: Raynelle Bring, MD;  Location: [facility];  Service: Urology;  Laterality: N/A;  . CYSTOSCOPY W/ URETERAL STENT PLACEMENT Bilateral 12/25/2019   Procedure: CYSTOSCOPY WITH bilateral  RETROGRADE PYELOGRAM/ leftURETERAL STENT PLACEMENT;  Surgeon: Lucas Mallow, MD;  Location: [facility];  Service: Urology;  Laterality: Bilateral;  . CYSTOSCOPY WITH URETEROSCOPY AND STENT PLACEMENT N/A 04/18/2020   Procedure: CYSTOSCOPY WITH BILATERAL URETERAL STENT CHANGE PLACEMENT;  Surgeon: Ardis Hughs, MD;  Location: [facility];  Service: Urology;  Laterality: N/A;  . EPIGASTRIC HERNIA REPAIR N/A 04/18/2020   Procedure: HERNIA REPAIR EPIGASTRIC ADULT;  Surgeon: Michael Boston, MD;  Location: [facility];  Service: General;  Laterality: N/A;  . GASTRIC BYPASS    . HERNIA REPAIR    . IR FLUORO GUIDE CV LINE RIGHT  04/01/2020  . IR NEPHRO TUBE REMOV/FL  04/25/2020  . IR NEPHROSTOMY PLACEMENT LEFT  12/27/2019  . IR NEPHROSTOMY PLACEMENT LEFT  04/01/2020  . IR NEPHROSTOMY PLACEMENT RIGHT  11/16/2019  . IR NEPHROSTOMY PLACEMENT RIGHT  12/27/2019  . IR NEPHROSTOMY PLACEMENT RIGHT  04/01/2020  . IR SINUS/FIST TUBE CHK-NON GI  12/03/2019  . IR URETERAL STENT PLACEMENT EXISTING ACCESS RIGHT  12/08/2019  . IR URETERAL STENT PLACEMENT EXISTING ACCESS  RIGHT  01/26/2020  . IR US GUIDE VASC ACCESS RIGHT  04/01/2020  . LAPAROSCOPIC LOOP COLOSTOMY N/A 04/18/2020   Procedure: LAPAROSCOPIC SIGMOID COLOTOMY WITH COLOSTOMY;  Surgeon: Michael Boston, MD;  Location: [facility];  Service: General;  Laterality: N/A;    Allergies:  Allergies  Allergen Reactions  .  Nsaids Other (See Comments)    Bleeding GI Ulceration  . Ciprofloxacin Nausea And Vomiting    Medications:   Prior to Admission medications   Medication Sig Start Date End Date Taking? Authorizing Provider  gabapentin (NEURONTIN) 300 MG capsule Take 1 capsule (300 mg total) by mouth at bedtime. 05/03/20  Yes Samella Parr, NP  oxyCODONE (OXY IR/ROXICODONE) 5 MG immediate release tablet Take 1-2 tablets (5-10 mg total) by mouth every 4 (four) hours as needed for moderate pain. 05/03/20  Yes Samella Parr, NP  pantoprazole (PROTONIX) 20 MG tablet Take 1 tablet (20 mg total) by mouth 2 (two) times daily. 05/03/20  Yes Samella Parr, NP  acetaminophen (TYLENOL) 500 MG tablet Take 1 tablet (500 mg total) by mouth every 6 (six) hours as needed for mild pain, fever or headache. 05/01/20   Samella Parr, NP  calcium carbonate (TUMS - DOSED IN MG ELEMENTAL CALCIUM) 500 MG chewable tablet Chew 1 tablet (200 mg of elemental calcium total) by mouth 3 (three) times daily. 05/01/20   Samella Parr, NP  Cholecalciferol (VITAMIN D3) 20 MCG (800 UNIT) TABS Take 800 Units by mouth daily. 05/03/20   Samella Parr, NP  cyanocobalamin (,VITAMIN B-12,) 1000 MCG/ML injection Inject 1,000 mcg into the muscle every 30 (thirty) days.  12/28/13   [provider]  DULoxetine (CYMBALTA) 30 MG capsule Take 1-2 capsules (30-60 mg total) by mouth See admin instructions. Take 60 mg in the morning and 30 mg in the evening 05/03/20   Samella Parr, NP  gabapentin (NEURONTIN) 100 MG capsule Take 2 capsules (200 mg total) by mouth daily. Patient taking differently: Take 100 mg by mouth 3 (three) times daily. 01/09/20 03/03/20  Elodia Florence., MD  gabapentin (NEURONTIN) 100 MG capsule Take 2 capsules (200 mg total) by mouth daily. 05/03/20 06/02/20  Samella Parr, NP  hydrOXYzine (ATARAX/VISTARIL) 10 MG tablet Take 1 tablet (10 mg total) by mouth 3 (three) times daily as needed for itching or anxiety. 05/03/20    Samella Parr, NP  Multiple Vitamin (MULTIVITAMIN WITH MINERALS) TABS tablet Take 1 tablet by mouth daily. 05/03/20   Samella Parr, NP  polycarbophil (FIBERCON) 625 MG tablet Take 1 tablet (625 mg total) by mouth 2 (two) times daily. 05/01/20   Samella Parr, NP  sodium bicarbonate 650 MG tablet Take 1 tablet (650 mg total) by mouth 2 (two) times daily. 05/03/20   Samella Parr, NP  tiZANidine (ZANAFLEX) 2 MG tablet Take 1 tablet (2 mg total) by mouth every 8 (eight) hours as needed for muscle spasms. 05/03/20   Samella Parr, NP    Discontinued Meds:  There are no discontinued medications.  Social History:  reports that he has never smoked. He uses smokeless tobacco. He reports that he does not drink alcohol and does not use drugs.  Family History:   Family History  Problem Relation Age of Onset  . Renal cancer Father        mets to liver and lungs and pancreas  . Heart disease Father   . Heart disease Paternal Grandfather   . Colon  cancer Maternal Grandmother   . Colon cancer Paternal Uncle   . Stomach cancer Neg Hx   . Esophageal cancer Neg Hx   . Pancreatic cancer Neg Hx     Blood pressure 110/61, pulse 66, temperature (!) 93.4 F (34.1 C), temperature source Rectal, resp. rate 11, height 5\' 6"  (1.676 m), weight 58.4 kg, SpO2 100 %. General appearance: lethargic and moaning Head: Normocephalic, without obvious abnormality, atraumatic Eyes: negative Neck: no adenopathy, no carotid bruit, no JVD, supple, symmetrical, trachea midline and thyroid not enlarged, symmetric, no tenderness/mass/nodules Back: decub ulcers Resp: clear to auscultation bilaterally Chest wall: no tenderness Cardio: regular rate and rhythm GI: wasting, colostomy Extremities: extremities normal, atraumatic, no cyanosis or edema Pulses: 2+ and symmetric Skin: Skin color, texture, turgor normal. No rashes or lesions Lymph nodes: Cervical adenopathy: none Neurologic: Mental status: lethargic and not  following commands       Shantanu Strauch, Hunt Oris, MD 06/27/2020, 5:51 PM

## 2020-06-27 NOTE — Progress Notes (Signed)
[facility] Progress Note Patient Name: Chase [name] DOB: 1965-11-02 MRN: [id number]   Date of Service  06/27/2020  HPI/Events of Note  Multiple issues: 1. ABG on room air = 6.88/<19/142/1.7. Patient is already on a NaHCO3 IV infusion at 125 mL/hour and was started on CRRT 45 minutes ago and 2. Patient in severe pain.   eICU Interventions  Plan: 1. NaHCO3 200 meq IV now. 2. Fentanyl 25-50 mcg IV Q 2 hours PRN severe pain.  3. Repeat ABG at 12 midnight.      Intervention Category Major Interventions: Acid-Base disturbance - evaluation and management;Other:  Lysle Dingwall 06/27/2020, 9:17 PM

## 2020-06-27 NOTE — ED Notes (Signed)
Both sons updated on patient condition and in agreement to transfer patient to [facility].

## 2020-06-27 NOTE — Progress Notes (Signed)
[facility] Progress Note Patient Name: Chase [name] DOB: 04-15-1965 MRN: [id number]   Date of Service  06/27/2020  HPI/Events of Note  Hypotension - BP = 79/33 with MAP = 47.  eICU Interventions  Plan: 1. Monitor CVP now and Q 4 hours.  2. Norepinephrine IV infusion. Titrate to MAP >= 65.     Intervention Category Major Interventions: Hypotension - evaluation and management  Lysle Dingwall 06/27/2020, 9:37 PM

## 2020-06-27 NOTE — Progress Notes (Signed)
CRRT started 2035.

## 2020-06-27 NOTE — Procedures (Signed)
Central Venous Catheter Insertion Procedure Note  Chase [name]  [id number]  Jun 12, 1965  Date:06/27/20  Time:6:50 PM   Provider Performing:Ohm Dentler Wanda Plump Rosana Hoes   Procedure: Insertion of Non-tunneled Central Venous Catheter(36556)with US guidance (00459)    Indication(s) Hemodialysis  Consent Risks of the procedure as well as the alternatives and risks of each were explained to the patient and/or caregiver.  Consent for the procedure was obtained and is signed in the bedside chart  Anesthesia Topical only with 1% lidocaine   Timeout Verified patient identification, verified procedure, site/side was marked, verified correct patient position, special equipment/implants available, medications/allergies/relevant history reviewed, required imaging and test results available.  Sterile Technique Maximal sterile technique including full sterile barrier drape, hand hygiene, sterile gown, sterile gloves, mask, hair covering, sterile ultrasound probe cover (if used).  Procedure Description Area of catheter insertion was cleaned with chlorhexidine and draped in sterile fashion.   With real-time ultrasound guidance a HD catheter was placed into the right internal jugular vein.  Nonpulsatile blood flow and easy flushing noted in all ports.  The catheter was sutured in place and sterile dressing applied.  Complications/Tolerance None; patient tolerated the procedure well. Chest X-ray is ordered to verify placement for internal jugular or subclavian cannulation.  Chest x-ray is not ordered for femoral cannulation.  EBL Minimal  Specimen(s) None   Johnsie Cancel, NP-C  Pulmonary & Critical Care Personal contact information can be found on Amion  If no response please page: Adult pulmonary and critical care medicine pager on Amion unitl 7pm After 7pm please call [phone] 06/27/2020, 6:51 PM

## 2020-06-27 NOTE — H&P (Signed)
NAME:  Chase [name], MRN:  [id number], DOB:  Nov 30, 1965, LOS: 0 ADMISSION DATE:  06/27/2020, CONSULTATION DATE: 06/27/2020 REFERRING MD: Dr. 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  He was admitted in January/February 2022 with acute on chronic renal failure related to obstructive nephropathy after his nephrostomy tubes were pulled.  He had internal ureteral stents replaced at that time.  A diverting loop colostomy was also performed for the fistula. He is on high-dose Neurontin and narcotics for chronic pain history of PTSD  He was found 3/29 on the floor by his son, unclear downtime.  Last seen well midday 3/28 when he was evaluated by EMS after a fall, question narcotics overdose.  He was not brought to the ED at that time.  His son reported that overall his health has been in steady decline, has difficulty with his ADL.  In the [facility] ED on 3/29 found to have a profound metabolic acidosis with acute on chronic renal failure, elevated CK, relative hypotension, mild transaminitis.  He was started on IV bicarbonate, IV fluid resuscitation, received empiric ceftriaxone.    Pertinent  Medical History   Past Medical History:  Diagnosis Date  . Cervical radiculopathy    with left sided weakness  . Chemical exposure    service related injury  . Chronic pain   . CKD (chronic kidney disease), stage IV ([place]) 10/17/2019  . COVID-19   . GERD (gastroesophageal reflux disease)   . GIB (gastrointestinal bleeding)   . Heart attack ([place])   . History of  CVA (cerebrovascular accident) 12/31/2019  . 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 History of stomach ulcers 05/19/2013   Formatting of this note might be different from the original. Attributed to chronic ibuprofen use  . Hodgkin lymphoma, unspecified, unspecified site ([place]) 12/10/2012   Formatting of this note might be different from the original. In remission.  . Hypokalemia   . Hypomagnesemia   . PTSD (post-traumatic stress disorder)   . Renal disorder    CKD  . Stroke ([place])   . Urinary retention    due to bladder outlet obstruction    Significant Hospital Events: Including procedures, antibiotic start and stop dates in addition to other pertinent events   . UDS 3/29 >> negative . CT abdomen/pelvis 3/29 >> bilateral renal atrophy, bilateral ureteral stents are noted in good position without any hydronephrosis.  Foley catheter present.  History gastric surgery with left lower quadrant colostomy. . Renal ultrasound 3/29 >> stable moderate bilateral hydroureteronephrosis, right ureteral stent extending to the renal pelvis, left ureteral stent faintly visualized.  Echogenic focus in the left kidney question nonobstructing stone or air bubble . CT pelvis/left hip 3/29 >> no evidence of hip fracture or dislocation normal joint space, decreased offset of the bilateral femoral head neck junctions without suspicious bony lesion . Chest x-ray 3/29 >> lungs clear, no infiltrates . Head CT 3/29 >> no acute intracranial abnormality, old right occipital lobe infarct  Interim History / Subjective:   Patient arrived, hemodynamically stable, obtunded Son is available in the waiting area  Objective   Blood pressure 110/75, pulse 65, temperature (!)  93.4 F (34.1 C), temperature source Rectal, resp. rate 11, height 5\' 6"  (1.676 m), weight 58.4 kg, SpO2 100 %.        Intake/Output Summary (Last 24 hours) at 06/27/2020 1647 Last data filed at 06/27/2020 1551 Gross per 24 hour  Intake 200 ml   Output --  Net 200 ml   Filed Weights   06/27/20 1223  Weight: 58.4 kg    Examination: General: Cachectic ill-appearing man, laying in bed HENT: Oropharynx dry, pupils equal, temporal wasting, no stridor Lungs: Clear bilaterally, no wheeze or crackles Cardiovascular: Regular, distant, no murmur Abdomen: Nondistended, colostomy in place, positive bowel sounds Extremities: No edema Neuro: Obtunded, he will wake to pain and moan.  Appears to be protecting his airway, no secretions.  Pupils react GU: Foley catheter in place   Resolved Hospital Problem list     Assessment & Plan:   Acute on chronic (stage IV-V) renal failure.  Long history of ureteral obstruction but current CT abdomen does not show hydronephrosis.  Question due to hypovolemia, rhabdomyolysis, question sepsis.  Severe anion gap metabolic acidosis with normal lactate, EtOH negative.  Presumed due to the above Probable rhabdomyolysis -Check volatile alcohols stat -Reviewed CT abdomen and status with urology.  No indication for percutaneous nephrostomy tubes in absence of hydronephrosis.  Appreciate input -He will need urgent hemodialysis, presumed CVVHD based on his hemodynamics.  Plan to discuss with nephrology and obtain IV access -Continue bicarbonate infusion for now, hopefully discontinue once CVVH in effect -follow ABG, BMP, UOP -Follow CK trend, at high risk for progressive rhabdomyolysis as downtime is unclear -Empiric antibiotics for now pending culture data.  Blood cultures and urine culture have been sent  Acute toxic metabolic encephalopathy.  Due to his metabolic disarray, renal failure.  Also consider toxic substances.  His UDS is negative -Check volatile alcohols as above -Goal to reverse his metabolic encephalopathy and acidosis by initiating CVVHD as soon as possible. -He is at high risk for the need for intubation for airway protection but I am trying to avoid as his chronic debilitated state would  make him a poor candidate, would increase the chances that he would never be successfully extubated.  CAD -Not on aspirin or antiplatelet medications as an outpatient.  Defer for now  Mild transaminitis.  Question shock liver. -Follow LFT -Check acute hepatitis panel  Diverting colostomy, history of colovesicular fistula -Standard colostomy care  Chronic pain -On high-dose Neurontin at home.  Currently on hold but will need to discuss with pharmacy any potential risk for withdrawal at these doses. -Hold Zanaflex -Minimize narcotics as able -Restart Cymbalta when he is able to take enteral medications  Protein calorie malnutrition -Place NG tube and initiate tube feeding when stable to do so  GOC -Discussed patient status, critical illness, very guarded prognosis with his son Jahki in the ICU.  He understands.  I told him that his father may not survive this illness, may require intubation and mechanical ventilation both for his mental status and also to support him through profound acidosis.  I have recommended that the patient would not survive CPR/ACLS and that this should be deferred if he were to acutely decompensate.  [name] understands and agrees.  I will place limited code orders.  The patient would accept all medical care and ventilation at least short-term to treat reversible causes.   Best practice (right click and "Reselect all SmartList Selections" daily)  Diet:  NPO Pain/Anxiety/Delirium protocol (if indicated): No VAP protocol (  if indicated): Not indicated DVT prophylaxis: Subcutaneous Heparin GI prophylaxis: PPI Glucose control:  SSI No Central venous access:  Yes, and it is still needed Arterial line:  N/A Foley:  Yes, and it is still needed Mobility:  bed rest  PT consulted: N/A Last date of multidisciplinary goals of care discussion [06/27/20] Code Status:  limited Disposition: ICU  Labs   CBC: Recent Labs  Lab 06/27/20 1237  WBC 13.2*  NEUTROABS  11.7*  HGB 9.3*  HCT 28.5*  MCV 97.6  PLT 361    Basic Metabolic Panel: Recent Labs  Lab 06/27/20 1237  NA 139  K 5.1  CL 113*  CO2 <7*  GLUCOSE 86  BUN 102*  CREATININE 10.66*  CALCIUM 7.6*   GFR: Estimated Creatinine Clearance: 6.4 mL/min (A) (by C-G formula based on SCr of 10.66 mg/dL (H)). Recent Labs  Lab 06/27/20 1237 06/27/20 1238  WBC 13.2*  --   LATICACIDVEN  --  0.9    Liver Function Tests: Recent Labs  Lab 06/27/20 1237  AST 77*  ALT 84*  ALKPHOS 178*  BILITOT 1.1  PROT 6.6  ALBUMIN 2.9*   No results for input(s): LIPASE, AMYLASE in the last 168 hours. No results for input(s): AMMONIA in the last 168 hours.  ABG    Component Value Date/Time   PHART <6.8 (LL) 06/27/2020 1434   PCO2ART <19.0 (LL) 06/27/2020 1434   PO2ART 185 (H) 06/27/2020 1434   HCO3 NOT CALCULATED 06/27/2020 1434   TCO2 23 04/18/2020 1205   ACIDBASEDEF 5.2 (H) 04/02/2020 0020   O2SAT 96.8 06/27/2020 1434     Coagulation Profile: No results for input(s): INR, PROTIME in the last 168 hours.  Cardiac Enzymes: Recent Labs  Lab 06/27/20 1237  CKTOTAL 2,210*    HbA1C: Hgb A1c MFr Bld  Date/Time Value Ref Range Status  10/18/2019 12:13 AM 5.4 4.8 - 5.6 % Final    Comment:    (NOTE) Pre diabetes:          5.7%-6.4%  Diabetes:              >6.4%  Glycemic control for   <7.0% adults with diabetes     CBG: Recent Labs  Lab 06/27/20 1245  GLUCAP 80    Review of Systems:   Unable to obtain  Past Medical History:  He,  has a past medical history of Cervical radiculopathy, Chemical exposure, Chronic pain, CKD (chronic kidney disease), stage IV ([place]) (10/17/2019), COVID-19, GERD (gastroesophageal reflux disease), GIB (gastrointestinal bleeding), Heart attack ([place]), History of CVA (cerebrovascular accident) (12/31/2019), History of gastric bypass (08/24/2019), History of stomach ulcers (05/19/2013), Hodgkin lymphoma, unspecified, unspecified site ([place]) (12/10/2012),  Hypokalemia, Hypomagnesemia, PTSD (post-traumatic stress disorder), Renal disorder, Stroke ([place]), and Urinary retention.   Surgical History:   Past Surgical History:  Procedure Laterality Date  . COLONOSCOPY    . COLONOSCOPY WITH PROPOFOL N/A 04/18/2020   Procedure: COLONOSCOPY WITH PROPOFOL;  Surgeon: Doran Stabler, MD;  Location: [facility];  Service: Gastroenterology;  Laterality: N/A;  . CYSTOSCOPY W/ URETERAL STENT PLACEMENT N/A 11/14/2019   Procedure: CYSTOSCOPY WITH RETROGRADE PYELOGRAM bilateral Wyvonnia Dusky STENT PLACEMENT left fulguration bladder . cystogram;  Surgeon: Raynelle Bring, MD;  Location: [facility];  Service: Urology;  Laterality: N/A;  . CYSTOSCOPY W/ URETERAL STENT PLACEMENT Bilateral 12/25/2019   Procedure: CYSTOSCOPY WITH bilateral  RETROGRADE PYELOGRAM/ leftURETERAL STENT PLACEMENT;  Surgeon: Lucas Mallow, MD;  Location: [facility];  Service: Urology;  Laterality: Bilateral;  .  CYSTOSCOPY WITH URETEROSCOPY AND STENT PLACEMENT N/A 04/18/2020   Procedure: CYSTOSCOPY WITH BILATERAL URETERAL STENT CHANGE PLACEMENT;  Surgeon: Ardis Hughs, MD;  Location: [facility];  Service: Urology;  Laterality: N/A;  . EPIGASTRIC HERNIA REPAIR N/A 04/18/2020   Procedure: HERNIA REPAIR EPIGASTRIC ADULT;  Surgeon: Michael Boston, MD;  Location: [facility];  Service: General;  Laterality: N/A;  . GASTRIC BYPASS    . HERNIA REPAIR    . IR FLUORO GUIDE CV LINE RIGHT  04/01/2020  . IR NEPHRO TUBE REMOV/FL  04/25/2020  . IR NEPHROSTOMY PLACEMENT LEFT  12/27/2019  . IR NEPHROSTOMY PLACEMENT LEFT  04/01/2020  . IR NEPHROSTOMY PLACEMENT RIGHT  11/16/2019  . IR NEPHROSTOMY PLACEMENT RIGHT  12/27/2019  . IR NEPHROSTOMY PLACEMENT RIGHT  04/01/2020  . IR SINUS/FIST TUBE CHK-NON GI  12/03/2019  . IR URETERAL STENT PLACEMENT EXISTING ACCESS RIGHT  12/08/2019  . IR URETERAL STENT PLACEMENT EXISTING ACCESS RIGHT  01/26/2020  . IR US GUIDE VASC ACCESS RIGHT  04/01/2020  . LAPAROSCOPIC LOOP COLOSTOMY N/A 04/18/2020    Procedure: LAPAROSCOPIC SIGMOID COLOTOMY WITH COLOSTOMY;  Surgeon: Michael Boston, MD;  Location: [facility];  Service: General;  Laterality: N/A;     Social History:   reports that he has never smoked. He uses smokeless tobacco. He reports that he does not drink alcohol and does not use drugs.   Family History:  His family history includes Colon cancer in his maternal grandmother and paternal uncle; Heart disease in his father and paternal grandfather; Renal cancer in his father. There is no history of Stomach cancer, Esophageal cancer, or Pancreatic cancer.   Allergies Allergies  Allergen Reactions  . Nsaids Other (See Comments)    Bleeding GI Ulceration  . Ciprofloxacin Nausea And Vomiting     Home Medications  Prior to Admission medications   Medication Sig Start Date End Date Taking? Authorizing Provider  gabapentin (NEURONTIN) 300 MG capsule Take 1 capsule (300 mg total) by mouth at bedtime. 05/03/20  Yes Samella Parr, NP  oxyCODONE (OXY IR/ROXICODONE) 5 MG immediate release tablet Take 1-2 tablets (5-10 mg total) by mouth every 4 (four) hours as needed for moderate pain. 05/03/20  Yes Samella Parr, NP  pantoprazole (PROTONIX) 20 MG tablet Take 1 tablet (20 mg total) by mouth 2 (two) times daily. 05/03/20  Yes Samella Parr, NP  acetaminophen (TYLENOL) 500 MG tablet Take 1 tablet (500 mg total) by mouth every 6 (six) hours as needed for mild pain, fever or headache. 05/01/20   Samella Parr, NP  calcium carbonate (TUMS - DOSED IN MG ELEMENTAL CALCIUM) 500 MG chewable tablet Chew 1 tablet (200 mg of elemental calcium total) by mouth 3 (three) times daily. 05/01/20   Samella Parr, NP  Cholecalciferol (VITAMIN D3) 20 MCG (800 UNIT) TABS Take 800 Units by mouth daily. 05/03/20   Samella Parr, NP  cyanocobalamin (,VITAMIN B-12,) 1000 MCG/ML injection Inject 1,000 mcg into the muscle every 30 (thirty) days.  12/28/13   [provider]  DULoxetine (CYMBALTA) 30 MG capsule Take  1-2 capsules (30-60 mg total) by mouth See admin instructions. Take 60 mg in the morning and 30 mg in the evening 05/03/20   Samella Parr, NP  gabapentin (NEURONTIN) 100 MG capsule Take 2 capsules (200 mg total) by mouth daily. Patient taking differently: Take 100 mg by mouth 3 (three) times daily. 01/09/20 03/03/20  Elodia Florence., MD  gabapentin (NEURONTIN) 100 MG capsule  Take 2 capsules (200 mg total) by mouth daily. 05/03/20 06/02/20  Samella Parr, NP  hydrOXYzine (ATARAX/VISTARIL) 10 MG tablet Take 1 tablet (10 mg total) by mouth 3 (three) times daily as needed for itching or anxiety. 05/03/20   Samella Parr, NP  Multiple Vitamin (MULTIVITAMIN WITH MINERALS) TABS tablet Take 1 tablet by mouth daily. 05/03/20   Samella Parr, NP  polycarbophil (FIBERCON) 625 MG tablet Take 1 tablet (625 mg total) by mouth 2 (two) times daily. 05/01/20   Samella Parr, NP  sodium bicarbonate 650 MG tablet Take 1 tablet (650 mg total) by mouth 2 (two) times daily. 05/03/20   Samella Parr, NP  tiZANidine (ZANAFLEX) 2 MG tablet Take 1 tablet (2 mg total) by mouth every 8 (eight) hours as needed for muscle spasms. 05/03/20   Samella Parr, NP     Critical care time: 80 minutes     Baltazar Apo, MD, PhD 06/27/2020, 6:35 PM [facility] [phone] or if no answer before 7:00PM call [phone] For any issues after 7:00PM please call eLink [phone]

## 2020-06-27 NOTE — Consult Note (Signed)
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  He has a history of bilateral hydronephrosis status post bilateral ureteral stent placement.  Back in January, he underwent diverting colostomy and at that time bilateral ureteral stents were exchanged.  Nephrostomy tubes were removed during that hospitalization.  He was last seen in clinic this past month and failed a voiding trial.  He was a no-show for his appointment after undergoing a urodynamics.  He presented to [facility] today after being found on the floor by his son with an unclear downtime.  He was found to have profound metabolic acidosis with acute on chronic renal failure and was obtunded.  Given his critical condition, he was transferred emergently to [facility] long and admitted to the ICU.  During evaluation, a renal ultrasound showed bilateral hydronephrosis but he subsequently had a CT scan without contrast of the abdomen and pelvis that revealed bilateral ureteral stents were in good position without hydronephrosis.  Foley catheter was in place in the bladder.  Upon my evaluation, the patient was being evaluated by critical care and was in critical condition.  Past Medical History:  Diagnosis Date  . Cervical radiculopathy    with left sided weakness  . Chemical exposure    service related injury  . Chronic pain   . CKD (chronic kidney disease), stage IV ([place]) 10/17/2019  . COVID-19   . GERD (gastroesophageal reflux disease)   . GIB (gastrointestinal bleeding)   . Heart attack ([place])   . History of CVA (cerebrovascular accident) 12/31/2019  . 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 History of stomach ulcers 05/19/2013   Formatting of this note might be different from the original. Attributed to chronic ibuprofen use  . Hodgkin lymphoma, unspecified,  unspecified site ([place]) 12/10/2012   Formatting of this note might be different from the original. In remission.  . Hypokalemia   . Hypomagnesemia   . PTSD (post-traumatic stress disorder)   . Renal disorder    CKD  . Stroke ([place])   . Urinary retention    due to bladder outlet obstruction   Past Surgical History:  Procedure Laterality Date  . COLONOSCOPY    . COLONOSCOPY WITH PROPOFOL N/A 04/18/2020   Procedure: COLONOSCOPY WITH PROPOFOL;  Surgeon: Doran Stabler, MD;  Location: [place];  Service: Gastroenterology;  Laterality: N/A;  . CYSTOSCOPY W/ URETERAL STENT PLACEMENT N/A 11/14/2019   Procedure: CYSTOSCOPY WITH RETROGRADE PYELOGRAM bilateral Wyvonnia Dusky STENT PLACEMENT left fulguration bladder . cystogram;  Surgeon: Raynelle Bring, MD;  Location: [facility];  Service: Urology;  Laterality: N/A;  . CYSTOSCOPY W/ URETERAL STENT PLACEMENT Bilateral 12/25/2019   Procedure: CYSTOSCOPY WITH bilateral  RETROGRADE PYELOGRAM/ leftURETERAL STENT PLACEMENT;  Surgeon: Lucas Mallow, MD;  Location: [facility];  Service: Urology;  Laterality: Bilateral;  . CYSTOSCOPY WITH URETEROSCOPY AND STENT PLACEMENT N/A 04/18/2020   Procedure: CYSTOSCOPY WITH BILATERAL URETERAL STENT CHANGE PLACEMENT;  Surgeon: Ardis Hughs, MD;  Location: [place];  Service: Urology;  Laterality: N/A;  . EPIGASTRIC HERNIA REPAIR N/A 04/18/2020   Procedure: HERNIA REPAIR EPIGASTRIC ADULT;  Surgeon: Michael Boston, MD;  Location: [place];  Service: General;  Laterality: N/A;  . GASTRIC BYPASS    . HERNIA REPAIR    . IR FLUORO GUIDE CV LINE RIGHT  04/01/2020  . IR NEPHRO TUBE REMOV/FL  04/25/2020  .  IR NEPHROSTOMY PLACEMENT LEFT  12/27/2019  . IR NEPHROSTOMY PLACEMENT LEFT  04/01/2020  . IR NEPHROSTOMY PLACEMENT RIGHT  11/16/2019  . IR NEPHROSTOMY PLACEMENT RIGHT  12/27/2019  . IR NEPHROSTOMY PLACEMENT RIGHT  04/01/2020  . IR SINUS/FIST TUBE CHK-NON GI  12/03/2019  . IR URETERAL STENT PLACEMENT EXISTING ACCESS RIGHT  12/08/2019  . IR  URETERAL STENT PLACEMENT EXISTING ACCESS RIGHT  01/26/2020  . IR US GUIDE VASC ACCESS RIGHT  04/01/2020  . LAPAROSCOPIC LOOP COLOSTOMY N/A 04/18/2020   Procedure: LAPAROSCOPIC SIGMOID COLOTOMY WITH COLOSTOMY;  Surgeon: Michael Boston, MD;  Location: [place];  Service: General;  Laterality: N/A;    Home Medications:  Medications Prior to Admission  Medication Sig Dispense Refill Last Dose  . gabapentin (NEURONTIN) 300 MG capsule Take 1 capsule (300 mg total) by mouth at bedtime. 30 capsule 3   . oxyCODONE (OXY IR/ROXICODONE) 5 MG immediate release tablet Take 1-2 tablets (5-10 mg total) by mouth every 4 (four) hours as needed for moderate pain. 14 tablet 0   . pantoprazole (PROTONIX) 20 MG tablet Take 1 tablet (20 mg total) by mouth 2 (two) times daily. 15 tablet 0   . acetaminophen (TYLENOL) 500 MG tablet Take 1 tablet (500 mg total) by mouth every 6 (six) hours as needed for mild pain, fever or headache. 30 tablet 0   . calcium carbonate (TUMS - DOSED IN MG ELEMENTAL CALCIUM) 500 MG chewable tablet Chew 1 tablet (200 mg of elemental calcium total) by mouth 3 (three) times daily.     . Cholecalciferol (VITAMIN D3) 20 MCG (800 UNIT) TABS Take 800 Units by mouth daily. 75 tablet 0   . cyanocobalamin (,VITAMIN B-12,) 1000 MCG/ML injection Inject 1,000 mcg into the muscle every 30 (thirty) days.      . DULoxetine (CYMBALTA) 30 MG capsule Take 1-2 capsules (30-60 mg total) by mouth See admin instructions. Take 60 mg in the morning and 30 mg in the evening 14 capsule 0   . gabapentin (NEURONTIN) 100 MG capsule Take 2 capsules (200 mg total) by mouth daily. (Patient taking differently: Take 100 mg by mouth 3 (three) times daily.) 60 capsule 0   . gabapentin (NEURONTIN) 100 MG capsule Take 2 capsules (200 mg total) by mouth daily. 60 capsule 0   . hydrOXYzine (ATARAX/VISTARIL) 10 MG tablet Take 1 tablet (10 mg total) by mouth 3 (three) times daily as needed for itching or anxiety. 30 tablet 0   . Multiple  Vitamin (MULTIVITAMIN WITH MINERALS) TABS tablet Take 1 tablet by mouth daily. 30 tablet 0   . polycarbophil (FIBERCON) 625 MG tablet Take 1 tablet (625 mg total) by mouth 2 (two) times daily. 60 tablet 3   . sodium bicarbonate 650 MG tablet Take 1 tablet (650 mg total) by mouth 2 (two) times daily. 30 tablet 0   . tiZANidine (ZANAFLEX) 2 MG tablet Take 1 tablet (2 mg total) by mouth every 8 (eight) hours as needed for muscle spasms. 15 tablet 0    Allergies:  Allergies  Allergen Reactions  . Nsaids Other (See Comments)    Bleeding GI Ulceration  . Ciprofloxacin Nausea And Vomiting    Family History  Problem Relation Age of Onset  . Renal cancer Father        mets to liver and lungs and pancreas  . Heart disease Father   . Heart disease Paternal Grandfather   . Colon cancer Maternal Grandmother   . Colon cancer Paternal Uncle   .  Stomach cancer Neg Hx   . Esophageal cancer Neg Hx   . Pancreatic cancer Neg Hx    Social History:  reports that he has never smoked. He uses smokeless tobacco. He reports that he does not drink alcohol and does not use drugs.  ROS: A complete review of systems was performed.  All systems are negative except for pertinent findings as noted. ROS   Physical Exam:  Vital signs in last 24 hours: Temp:  [89.9 F (32.2 C)-93.4 F (34.1 C)] 93.38 F (34.1 C) (03/29 1822) Pulse Rate:  [50-73] 64 (03/29 1822) Resp:  [8-15] 12 (03/29 1822) BP: (92-111)/(38-75) 98/46 (03/29 1800) SpO2:  [99 %-100 %] 100 % (03/29 1822) Weight:  [52.4 kg-58.4 kg] 52.4 kg (03/29 1741)  General: Obtunded, in critical condition.  Foley catheter in place  Laboratory Data:  Results for orders placed or performed during the hospital encounter of 06/27/20 (from the past 24 hour(s))  Comprehensive metabolic panel     Status: Abnormal   Collection Time: 06/27/20 12:37 PM  Result Value Ref Range   Sodium 139 135 - 145 mmol/L   Potassium 5.1 3.5 - 5.1 mmol/L   Chloride 113 (H)  98 - 111 mmol/L   CO2 <7 (L) 22 - 32 mmol/L   Glucose, Bld 86 70 - 99 mg/dL   BUN 102 (H) 6 - 20 mg/dL   Creatinine, Ser 10.66 (H) 0.61 - 1.24 mg/dL   Calcium 7.6 (L) 8.9 - 10.3 mg/dL   Total Protein 6.6 6.5 - 8.1 g/dL   Albumin 2.9 (L) 3.5 - 5.0 g/dL   AST 77 (H) 15 - 41 U/L   ALT 84 (H) 0 - 44 U/L   Alkaline Phosphatase 178 (H) 38 - 126 U/L   Total Bilirubin 1.1 0.3 - 1.2 mg/dL   GFR, Estimated 5 (L) >60 mL/min   Anion gap NOT CALCULATED 5 - 15  Ethanol     Status: None   Collection Time: 06/27/20 12:37 PM  Result Value Ref Range   Alcohol, Ethyl (B) <10 <10 mg/dL  CBC with Differential     Status: Abnormal   Collection Time: 06/27/20 12:37 PM  Result Value Ref Range   WBC 13.2 (H) 4.0 - 10.5 K/uL   RBC 2.92 (L) 4.22 - 5.81 MIL/uL   Hemoglobin 9.3 (L) 13.0 - 17.0 g/dL   HCT 28.5 (L) 39.0 - 52.0 %   MCV 97.6 80.0 - 100.0 fL   MCH 31.8 26.0 - 34.0 pg   MCHC 32.6 30.0 - 36.0 g/dL   RDW 16.7 (H) 11.5 - 15.5 %   Platelets 234 150 - 400 K/uL   nRBC 1.8 (H) 0.0 - 0.2 %   Neutrophils Relative % 88 %   Neutro Abs 11.7 (H) 1.7 - 7.7 K/uL   Lymphocytes Relative 4 %   Lymphs Abs 0.5 (L) 0.7 - 4.0 K/uL   Monocytes Relative 4 %   Monocytes Absolute 0.5 0.1 - 1.0 K/uL   Eosinophils Relative 0 %   Eosinophils Absolute 0.0 0.0 - 0.5 K/uL   Basophils Relative 1 %   Basophils Absolute 0.1 0.0 - 0.1 K/uL   Immature Granulocytes 3 %   Abs Immature Granulocytes 0.39 (H) 0.00 - 0.07 K/uL  CK     Status: Abnormal   Collection Time: 06/27/20 12:37 PM  Result Value Ref Range   Total CK 2,210 (H) 49 - 397 U/L  Lactic acid, plasma     Status: None  Collection Time: 06/27/20 12:38 PM  Result Value Ref Range   Lactic Acid, Venous 0.9 0.5 - 1.9 mmol/L  CBG monitoring, ED     Status: None   Collection Time: 06/27/20 12:45 PM  Result Value Ref Range   Glucose-Capillary 80 70 - 99 mg/dL  Culture, blood (routine x 2)     Status: None (Preliminary result)   Collection Time: 06/27/20 12:54 PM    Specimen: BLOOD  Result Value Ref Range   Specimen Description BLOOD BLOOD RIGHT ARM    Special Requests      Blood Culture adequate volume BOTTLES DRAWN AEROBIC AND ANAEROBIC Performed at [facility], [address], [place], [place] [address]    Culture PENDING    Report Status PENDING   Culture, blood (routine x 2)     Status: None (Preliminary result)   Collection Time: 06/27/20 12:54 PM   Specimen: BLOOD  Result Value Ref Range   Specimen Description BLOOD BLOOD LEFT ARM    Special Requests      Blood Culture adequate volume BOTTLES DRAWN AEROBIC AND ANAEROBIC Performed at [facility], [address], [place], [place] [address]    Culture PENDING    Report Status PENDING   Resp Panel by RT-PCR (Flu A&B, Covid) Nasopharyngeal Swab     Status: None   Collection Time: 06/27/20  1:25 PM   Specimen: Nasopharyngeal Swab; Nasopharyngeal(NP) swabs in vial transport medium  Result Value Ref Range   SARS Coronavirus 2 by RT PCR NEGATIVE NEGATIVE   Influenza A by PCR NEGATIVE NEGATIVE   Influenza B by PCR NEGATIVE NEGATIVE  Blood gas, arterial (at [facility])     Status: Abnormal   Collection Time: 06/27/20  2:34 PM  Result Value Ref Range   FIO2 32.00    pH, Arterial <6.8 (LL) 7.350 - 7.450   pCO2 arterial <19.0 (LL) 32.0 - 48.0 mmHg   pO2, Arterial 185 (H) 83.0 - 108.0 mmHg   Bicarbonate NOT CALCULATED 20.0 - 28.0 mmol/L   O2 Saturation 96.8 %   Patient temperature 37.0    Allens test (pass/fail) PASS PASS  Urinalysis, Routine w reflex microscopic Urine, Catheterized     Status: Abnormal   Collection Time: 06/27/20  3:44 PM  Result Value Ref Range   Color, Urine YELLOW YELLOW   APPearance TURBID (A) CLEAR   Specific Gravity, Urine 1.014 1.005 - 1.030   pH 6.0 5.0 - 8.0   Glucose, UA NEGATIVE NEGATIVE mg/dL   Hgb urine dipstick MODERATE (A) NEGATIVE   Bilirubin Urine NEGATIVE NEGATIVE   Ketones, ur 5 (A) NEGATIVE mg/dL   Protein, ur 100 (A) NEGATIVE mg/dL   Nitrite  NEGATIVE NEGATIVE   Leukocytes,Ua MODERATE (A) NEGATIVE   RBC / HPF 6-10 0 - 5 RBC/hpf   WBC, UA >50 (H) 0 - 5 WBC/hpf   Bacteria, UA MANY (A) NONE SEEN   WBC Clumps PRESENT    Non Squamous Epithelial 11-20 (A) NONE SEEN  Urine rapid drug screen (hosp performed)     Status: None   Collection Time: 06/27/20  3:44 PM  Result Value Ref Range   Opiates NONE DETECTED NONE DETECTED   Cocaine NONE DETECTED NONE DETECTED   Benzodiazepines NONE DETECTED NONE DETECTED   Amphetamines NONE DETECTED NONE DETECTED   Tetrahydrocannabinol NONE DETECTED NONE DETECTED   Barbiturates NONE DETECTED NONE DETECTED  Blood gas, arterial     Status: Abnormal   Collection Time: 06/27/20  5:59 PM  Result Value Ref Range   pH, Arterial 6.844 (LL) 7.350 - 7.450   pCO2 arterial <19.0 (LL) 32.0 - 48.0 mmHg   pO2, Arterial 148 (H) 83.0 - 108.0 mmHg   Bicarbonate 1.8 (L) 20.0 - 28.0 mmol/L   Acid-Base Excess REPORTED TO RESPIRATORY 0.0 - 2.0 mmol/L   O2 Saturation 97.1 %   Patient temperature 98.6    Collection site RIGHT RADIAL    Drawn by [id number]    Sample type ARTERIAL    Allens test (pass/fail) PASS PASS   Recent Results (from the past 240 hour(s))  Culture, blood (routine x 2)     Status: None (Preliminary result)   Collection Time: 06/27/20 12:54 PM   Specimen: BLOOD  Result Value Ref Range Status   Specimen Description BLOOD BLOOD RIGHT ARM  Final   Special Requests   Final    Blood Culture adequate volume BOTTLES DRAWN AEROBIC AND ANAEROBIC Performed at [facility], [address] [place], [place] [address]    Culture PENDING  Incomplete   Report Status PENDING  Incomplete  Culture, blood (routine x 2)     Status: None (Preliminary result)   Collection Time: 06/27/20 12:54 PM   Specimen: BLOOD  Result Value Ref Range Status   Specimen Description BLOOD BLOOD LEFT ARM  Final   Special Requests   Final    Blood Culture adequate volume BOTTLES DRAWN AEROBIC AND ANAEROBIC Performed at [facility], [address] [place], [place] [address]    Culture PENDING  Incomplete   Report Status PENDING  Incomplete  Resp Panel by RT-PCR (Flu A&B, Covid) Nasopharyngeal Swab     Status: None   Collection Time: 06/27/20  1:25 PM   Specimen: Nasopharyngeal Swab; Nasopharyngeal(NP) swabs in vial transport medium  Result Value Ref Range Status   SARS Coronavirus 2 by RT PCR NEGATIVE NEGATIVE Final    Comment: (NOTE) SARS-CoV-2 target nucleic acids are NOT DETECTED.  The SARS-CoV-2 RNA is generally detectable in upper respiratory specimens during the acute phase of infection. The lowest concentration of SARS-CoV-2 viral copies this assay can detect is 138 copies/mL. A negative result does not preclude SARS-Cov-2 infection and should not be used as the sole basis for treatment or other patient management decisions. A negative result may occur with  improper specimen collection/handling, submission of specimen other than nasopharyngeal swab, presence of viral mutation(s) within the areas targeted by this assay, and inadequate number of viral copies(<138 copies/mL). A negative result must be combined with clinical observations, patient history, and epidemiological information. The expected result is Negative.  Fact Sheet for Patients:  EntrepreneurPulse.com.au  Fact Sheet for Healthcare Providers:  IncredibleEmployment.be  This test is no t yet approved or cleared by the [place] FDA and  has been authorized for detection and/or diagnosis of SARS-CoV-2 by FDA under an Emergency Use Authorization (EUA). This EUA will remain  in effect (meaning this test can be used) for the duration of the COVID-19 declaration under Section 564(b)(1) of the Act, 21 U.S.C.section 360bbb-3(b)(1), unless the authorization is terminated  or revoked sooner.       Influenza A by PCR NEGATIVE NEGATIVE Final   Influenza B by PCR NEGATIVE NEGATIVE Final    Comment:  (NOTE) The Xpert Xpress SARS-CoV-2/FLU/RSV plus assay is intended as an aid in the diagnosis of influenza from Nasopharyngeal swab specimens and should not be used as a sole basis for treatment. Nasal washings and aspirates are unacceptable for Xpert Xpress SARS-CoV-2/FLU/RSV  testing.  Fact Sheet for Patients: EntrepreneurPulse.com.au  Fact Sheet for Healthcare Providers: IncredibleEmployment.be  This test is not yet approved or cleared by the [place] FDA and has been authorized for detection and/or diagnosis of SARS-CoV-2 by FDA under an Emergency Use Authorization (EUA). This EUA will remain in effect (meaning this test can be used) for the duration of the COVID-19 declaration under Section 564(b)(1) of the Act, 21 U.S.C. section 360bbb-3(b)(1), unless the authorization is terminated or revoked.  Performed at [facility], [address], [place], [place] [address]    Creatinine: Recent Labs    06/27/20 1237  CREATININE 10.66*    Impression/Assessment:  Acute on chronic stage V renal insufficiency  Plan:  CT shows no significant bilateral hydronephrosis.  Stents and Foley catheter are in good position.  Acute renal failure unlikely to be secondary to obstructive uropathy.  May be secondary to rhabdomyolysis versus other medical cause.  Do not recommend any surgical intervention or nephrostomy tubes at this time.  Marton Redwood, III 06/27/2020, 6:55 PM

## 2020-06-27 NOTE — ED Triage Notes (Signed)
Pt brought in by [facility] with c/o fall sometime during the night. Son found him in his bedroom floor this morning when he arrived from out of town. Unknown down time. Pt's baseline is alert and oriented according to EMS. He was last seen normal at 1200 yesterday by EMS when they got called out for a fall. CBG 93 by EMS. Pt has no purposeful movement. BP 104/52, HR 58 for EMS. Pt is responsive to painful stimuli. Mucous membranes appear dry.

## 2020-06-28 ENCOUNTER — Inpatient Hospital Stay (HOSPITAL_COMMUNITY): Payer: No Typology Code available for payment source

## 2020-06-28 DIAGNOSIS — R0600 Dyspnea, unspecified: Secondary | ICD-10-CM

## 2020-06-28 DIAGNOSIS — N179 Acute kidney failure, unspecified: Secondary | ICD-10-CM | POA: Diagnosis not present

## 2020-06-28 DIAGNOSIS — G9341 Metabolic encephalopathy: Secondary | ICD-10-CM

## 2020-06-28 DIAGNOSIS — N185 Chronic kidney disease, stage 5: Secondary | ICD-10-CM | POA: Diagnosis not present

## 2020-06-28 LAB — CBC
HCT: 21.9 % — ABNORMAL LOW (ref 39.0–52.0)
HCT: 21.9 % — ABNORMAL LOW (ref 39.0–52.0)
Hemoglobin: 7.8 g/dL — ABNORMAL LOW (ref 13.0–17.0)
Hemoglobin: 7.8 g/dL — ABNORMAL LOW (ref 13.0–17.0)
MCH: 31.3 pg (ref 26.0–34.0)
MCH: 31.5 pg (ref 26.0–34.0)
MCHC: 35.6 g/dL (ref 30.0–36.0)
MCHC: 35.6 g/dL (ref 30.0–36.0)
MCV: 88 fL (ref 80.0–100.0)
MCV: 88.3 fL (ref 80.0–100.0)
Platelets: 172 10*3/uL (ref 150–400)
Platelets: 158 10*3/uL (ref 150–400)
RBC: 2.49 MIL/uL — ABNORMAL LOW (ref 4.22–5.81)
RBC: 2.48 MIL/uL — ABNORMAL LOW (ref 4.22–5.81)
RDW: 15.3 % (ref 11.5–15.5)
RDW: 15.5 % (ref 11.5–15.5)
WBC: 10.1 10*3/uL (ref 4.0–10.5)
WBC: 11.4 10*3/uL — ABNORMAL HIGH (ref 4.0–10.5)
nRBC: 4.7 % — ABNORMAL HIGH (ref 0.0–0.2)
nRBC: 4 % — ABNORMAL HIGH (ref 0.0–0.2)

## 2020-06-28 LAB — RENAL FUNCTION PANEL
Albumin: 2.6 g/dL — ABNORMAL LOW (ref 3.5–5.0)
Anion gap: 15 (ref 5–15)
BUN: 48 mg/dL — ABNORMAL HIGH (ref 6–20)
CO2: 20 mmol/L — ABNORMAL LOW (ref 22–32)
Calcium: 6.5 mg/dL — ABNORMAL LOW (ref 8.9–10.3)
Chloride: 100 mmol/L (ref 98–111)
Creatinine, Ser: 3.54 mg/dL — ABNORMAL HIGH (ref 0.61–1.24)
GFR, Estimated: 19 mL/min — ABNORMAL LOW (ref >60)
Glucose, Bld: 95 mg/dL (ref 70–99)
Phosphorus: 2.8 mg/dL (ref 2.5–4.6)
Potassium: 3.1 mmol/L — ABNORMAL LOW (ref 3.5–5.1)
Sodium: 135 mmol/L (ref 135–145)

## 2020-06-28 LAB — HEPATITIS PANEL, ACUTE
HCV Ab: NONREACTIVE
Hep A IgM: NONREACTIVE
Hep B C IgM: NONREACTIVE
Hepatitis B Surface Ag: NONREACTIVE

## 2020-06-28 LAB — GLUCOSE, CAPILLARY
Glucose-Capillary: 101 mg/dL — ABNORMAL HIGH (ref 70–99)
Glucose-Capillary: 122 mg/dL — ABNORMAL HIGH (ref 70–99)
Glucose-Capillary: 85 mg/dL (ref 70–99)
Glucose-Capillary: 86 mg/dL (ref 70–99)
Glucose-Capillary: 96 mg/dL (ref 70–99)

## 2020-06-28 LAB — BLOOD GAS, ARTERIAL
Acid-base deficit: 16 mmol/L — ABNORMAL HIGH (ref 0.0–2.0)
Acid-base deficit: 11.3 mmol/L — ABNORMAL HIGH (ref 0.0–2.0)
Bicarbonate: 8.9 mmol/L — ABNORMAL LOW (ref 20.0–28.0)
Bicarbonate: 13.1 mmol/L — ABNORMAL LOW (ref 20.0–28.0)
FIO2: 21
FIO2: 21
O2 Saturation: 97.6 %
O2 Saturation: 97.9 %
Patient temperature: 99.1
Patient temperature: 99.3
pCO2 arterial: 18.4 mmHg — CL (ref 32.0–48.0)
pCO2 arterial: 25.7 mmHg — ABNORMAL LOW (ref 32.0–48.0)
pH, Arterial: 7.307 — ABNORMAL LOW (ref 7.350–7.450)
pH, Arterial: 7.33 — ABNORMAL LOW (ref 7.350–7.450)
pO2, Arterial: 112 mmHg — ABNORMAL HIGH (ref 83.0–108.0)
pO2, Arterial: 102 mmHg (ref 83.0–108.0)

## 2020-06-28 LAB — BASIC METABOLIC PANEL
Anion gap: 20 — ABNORMAL HIGH (ref 5–15)
BUN: 62 mg/dL — ABNORMAL HIGH (ref 6–20)
CO2: 13 mmol/L — ABNORMAL LOW (ref 22–32)
Calcium: 6.2 mg/dL — CL (ref 8.9–10.3)
Chloride: 102 mmol/L (ref 98–111)
Creatinine, Ser: 5.48 mg/dL — ABNORMAL HIGH (ref 0.61–1.24)
GFR, Estimated: 11 mL/min — ABNORMAL LOW (ref >60)
Glucose, Bld: 245 mg/dL — ABNORMAL HIGH (ref 70–99)
Potassium: <2 mmol/L — CL (ref 3.5–5.1)
Sodium: 135 mmol/L (ref 135–145)

## 2020-06-28 LAB — VOLATILES,BLD-ACETONE,ETHANOL,ISOPROP,METHANOL
Acetone, blood: 0.021 g/dL — ABNORMAL HIGH (ref 0.000–0.010)
Ethanol, blood: <0.01 g/dL (ref 0.000–0.010)
Isopropanol, blood: <0.01 g/dL (ref 0.000–0.010)
Methanol, blood: <0.01 g/dL (ref 0.000–0.010)

## 2020-06-28 LAB — ECHOCARDIOGRAM COMPLETE
Area-P 1/2: 2.39 cm2
Height: 66 in
S' Lateral: 3.31 cm
Weight: 1862.45 oz

## 2020-06-28 LAB — CK: Total CK: 1233 U/L — ABNORMAL HIGH (ref 49–397)

## 2020-06-28 LAB — ALBUMIN: Albumin: 2.4 g/dL — ABNORMAL LOW (ref 3.5–5.0)

## 2020-06-28 LAB — PROCALCITONIN: Procalcitonin: 31.73 ng/mL

## 2020-06-28 LAB — PHOSPHORUS: Phosphorus: 3.5 mg/dL (ref 2.5–4.6)

## 2020-06-28 LAB — MAGNESIUM
Magnesium: 1.8 mg/dL (ref 1.7–2.4)
Magnesium: 1.9 mg/dL (ref 1.7–2.4)

## 2020-06-28 MED ORDER — NOREPINEPHRINE 16 MG/250ML-% IV SOLN
0.0000 ug/min | INTRAVENOUS | Status: DC
  Administered 2020-06-28: 28 ug/min via INTRAVENOUS
  Administered 2020-06-28: 36 ug/min via INTRAVENOUS
  Administered 2020-06-29: 2 ug/min via INTRAVENOUS
  Filled 2020-06-28 (×3): qty 250

## 2020-06-28 MED ORDER — VANCOMYCIN HCL 750 MG/150ML IV SOLN
750.0000 mg | INTRAVENOUS | Status: DC
  Administered 2020-06-29: 750 mg via INTRAVENOUS
  Filled 2020-06-28: qty 150

## 2020-06-28 MED ORDER — MORPHINE SULFATE (PF) 2 MG/ML IV SOLN
1.0000 mg | Freq: Once | INTRAVENOUS | Status: AC
  Administered 2020-06-28: 2 mg via INTRAVENOUS
  Filled 2020-06-28: qty 1

## 2020-06-28 MED ORDER — POTASSIUM CHLORIDE 10 MEQ/50ML IV SOLN
10.0000 meq | INTRAVENOUS | Status: AC
  Administered 2020-06-28 (×4): 10 meq via INTRAVENOUS
  Filled 2020-06-28 (×4): qty 50

## 2020-06-28 MED ORDER — FENTANYL CITRATE (PF) 100 MCG/2ML IJ SOLN
50.0000 ug | Freq: Once | INTRAMUSCULAR | Status: AC
Start: 2020-06-28 — End: 2020-06-28
  Administered 2020-06-28: 50 ug via INTRAVENOUS
  Filled 2020-06-28: qty 2

## 2020-06-28 MED ORDER — PRISMASOL BGK 4/2.5 32-4-2.5 MEQ/L REPLACEMENT SOLN: Status: DC

## 2020-06-28 MED ORDER — ALBUMIN HUMAN 25 % IV SOLN
12.5000 g | Freq: Once | INTRAVENOUS | Status: AC
  Administered 2020-06-28: 12.5 g via INTRAVENOUS
  Filled 2020-06-28: qty 50

## 2020-06-28 MED ORDER — PIVOT 1.5 CAL PO LIQD
1000.0000 mL | ORAL | Status: DC
  Administered 2020-06-29: 1000 mL
  Filled 2020-06-28 (×2): qty 1000

## 2020-06-28 MED ORDER — CALCIUM GLUCONATE-NACL 1-0.675 GM/50ML-% IV SOLN
1.0000 g | Freq: Once | INTRAVENOUS | Status: AC
  Administered 2020-06-28: 1000 mg via INTRAVENOUS
  Filled 2020-06-28: qty 50

## 2020-06-28 MED ORDER — POTASSIUM CHLORIDE 20 MEQ PO PACK: 40.0000 meq | PACK | Freq: Once | ORAL | Status: DC

## 2020-06-28 MED ORDER — SODIUM CHLORIDE 0.9 % IV SOLN
1.0000 g | Freq: Three times a day (TID) | INTRAVENOUS | Status: DC
  Administered 2020-06-28 – 2020-06-30 (×7): 1 g via INTRAVENOUS
  Filled 2020-06-28 (×9): qty 1

## 2020-06-28 MED ORDER — VITAL HIGH PROTEIN PO LIQD: 1000.0000 mL | ORAL | Status: DC

## 2020-06-28 MED ORDER — HYDROCORTISONE NA SUCCINATE PF 100 MG IJ SOLR
50.0000 mg | Freq: Four times a day (QID) | INTRAMUSCULAR | Status: DC
  Administered 2020-06-28 – 2020-06-29 (×3): 50 mg via INTRAVENOUS
  Filled 2020-06-28 (×4): qty 2

## 2020-06-28 MED ORDER — PRISMASOL BGK 4/2.5 32-4-2.5 MEQ/L EC SOLN: Status: DC

## 2020-06-28 MED ORDER — LACTATED RINGERS IV BOLUS
1000.0000 mL | Freq: Once | INTRAVENOUS | Status: AC
  Administered 2020-06-28: 1000 mL via INTRAVENOUS

## 2020-06-28 MED ORDER — PIVOT 1.5 CAL PO LIQD
1000.0000 mL | ORAL | Status: DC
  Filled 2020-06-28: qty 1000

## 2020-06-28 MED ORDER — VANCOMYCIN HCL 1000 MG/200ML IV SOLN
1000.0000 mg | Freq: Once | INTRAVENOUS | Status: AC
  Administered 2020-06-28: 1000 mg via INTRAVENOUS
  Filled 2020-06-28: qty 200

## 2020-06-28 NOTE — Procedures (Signed)
I examined the patient on CRRT today.  He was tolerating well at that time. I have reviewed the session itself and made appropriate changes.  He is now on 4K bath due to hypokalemia.  Acidosis slow to improve  Filed Weights   06/27/20 1223 06/27/20 1741 06/28/20 0414  Weight: 58.4 kg 52.4 kg 52.8 kg    Recent Labs  Lab 06/28/20 0535  NA 135  K <2.0*  CL 102  CO2 13*  GLUCOSE 245*  BUN 62*  CREATININE 5.48*  CALCIUM 6.2*  PHOS 3.5    Recent Labs  Lab 06/27/20 1237 06/27/20 1830 06/28/20 0430 06/28/20 0535  WBC 13.2* 5.5 10.1 11.4*  NEUTROABS 11.7*  --   --   --   HGB 9.3* 8.4* 7.8* 7.8*  HCT 28.5* 25.6* 21.9* 21.9*  MCV 97.6 95.5 88.0 88.3  PLT 234 191 172 158    Scheduled Meds: . chlorhexidine gluconate (MEDLINE KIT)  15 mL Mouth Rinse BID  . Chlorhexidine Gluconate Cloth  6 each Topical Daily  . heparin  5,000 Units Subcutaneous Q8H  . mouth rinse  15 mL Mouth Rinse 10 times per day  . pantoprazole (PROTONIX) IV  40 mg Intravenous QHS   Continuous Infusions: .  prismasol BGK 4/2.5 500 mL/hr at 06/28/20 0700  .  prismasol BGK 4/2.5 300 mL/hr at 06/28/20 0700  . sodium chloride    . calcium gluconate 1,000 mg (06/28/20 1019)  . cefTRIAXone (ROCEPHIN)  IV Stopped (06/27/20 2021)  . norepinephrine (LEVOPHED) Adult infusion    . potassium chloride 50 mL/hr at 06/28/20 1000  . potassium chloride    . prismasol BGK 4/2.5 1,500 mL/hr at 06/28/20 1022  .  sodium bicarbonate (isotonic) infusion in sterile water 125 mL/hr at 06/28/20 1000   PRN Meds:.sodium chloride, docusate sodium, fentaNYL (SUBLIMAZE) injection, heparin sodium (porcine), polyethylene glycol, sodium chloride   Samuel Peeples,  MD 06/28/2020, 10:36 AM   

## 2020-06-28 NOTE — Progress Notes (Signed)
[facility] Progress Note Patient Name: Chase [name] DOB: 1966/02/07 MRN: [id number]   Date of Service  06/28/2020  HPI/Events of Note  ABG on room air = 7.33/25.7/102/13.1.  ABG continues to improve.   eICU Interventions  Continue present management.      Intervention Category Major Interventions: Acid-Base disturbance - evaluation and management  Morgon Pamer [place] 06/28/2020, 6:23 AM

## 2020-06-28 NOTE — Progress Notes (Signed)
[facility] Progress Note Patient Name: MCLAIN FREER DOB: 1965/11/20 MRN: [id number]   Date of Service  06/28/2020  HPI/Events of Note  screaming out in pain. Fentanyl helped for approx 30 minutes. received max dose @ 108mcgs Received high dose pain meds at home but unable to give po meds till cortrack placed.  Levo gtt rapidly decreasing  eICU Interventions  Given addn fent 50 mcg x 1 Still screaming in pain, on camera Morphine 1-2 mg x 1      Intervention Category Intermediate Interventions: Pain - evaluation and management  Lyzbeth Genrich V. Sigrid Schwebach 06/28/2020, 10:49 PM

## 2020-06-28 NOTE — Progress Notes (Signed)
  Echocardiogram 2D Echocardiogram has been performed.  Chase [name] 06/28/2020, 4:11 PM

## 2020-06-28 NOTE — Progress Notes (Signed)
Nephrology Follow-Up Consult note   Assessment/Recommendations: Chase [name] is a/an 55 y.o. male with a past medical history significant for gastric bypass surgery with complications, chronic obstructive uropathy with CKD, failure to thrive admitted for acute renal failure.     Severe AKI on CKD 5: Clear multiple factors contributing leading to renal failure with possible ATN given his severe illness.  Started on CRRT and tolerating well -Continue 4K bath given hypokalemia -Maintain no ultrafiltration at this time -Continue monitor urine output and daily weights -Overall poor prognosis related to failure to thrive, deconditioning, malnutrition.  He is a poor long-term dialysis candidate.  Complicated family situation.  Discussed care with son during previous hospitalization who understands the gravity of the patient's illness.  The patient has been hesitant to commit to no dialysis. Palliative care would likely be helpful  Sepsis: Most likely urinary source.  Continue to follow cultures and antibiotics per primary team  Severe hypokalemia: Potassium less than 2 today.  Now on 4K CRRT.  Repletion as needed.  Severe metabolic acidosis with anion gap: Likely multifactorial with renal failure contributing.  Continue dialysis  Chronic obstructive uropathy: Urology following.  Appreciate help.  No plans for PCN for now  Anemia of CKD: No IV iron given concern for infection.  Transfuse as needed   Recommendations conveyed to primary service.    [facility] 06/28/2020 10:25 AM  ___________________________________________________________  CC: ARF  Interval History/Subjective: Patient is now stable on CRRT.  Worsening hypokalemia overnight placed on 4K bath.  Did make a fair amount of urine yesterday but received a lot of fluids.  Patient has intermittently been significantly agitated.  He does not communicate today.   Medications:  Current  Facility-Administered Medications  Medication Dose Route Frequency Provider Last Rate Last Admin  .  prismasol BGK 4/2.5 infusion   CRRT Continuous Dwana Melena, MD 500 mL/hr at 06/28/20 0700 New Bag at 06/28/20 0700  .  prismasol BGK 4/2.5 infusion   CRRT Continuous Dwana Melena, MD 300 mL/hr at 06/28/20 0700 New Bag at 06/28/20 0700  . 0.9 %  sodium chloride infusion   Intravenous PRN Collene Gobble, MD      . calcium gluconate 1 g/ 50 mL sodium chloride IVPB  1 g Intravenous Once Dwana Melena, MD 50 mL/hr at 06/28/20 1019 1,000 mg at 06/28/20 1019  . cefTRIAXone (ROCEPHIN) 1 g in sodium chloride 0.9 % 100 mL IVPB  1 g Intravenous q1800 Collene Gobble, MD   Stopped at 06/27/20 2021  . chlorhexidine gluconate (MEDLINE KIT) (PERIDEX) 0.12 % solution 15 mL  15 mL Mouth Rinse BID Collene Gobble, MD   15 mL at 06/28/20 [phone]  . Chlorhexidine Gluconate Cloth 2 % PADS 6 each  6 each Topical Daily Collene Gobble, MD   6 each at 06/27/20 1830  . docusate sodium (COLACE) capsule 100 mg  100 mg Oral BID PRN Merlene Laughter F, NP      . fentaNYL (SUBLIMAZE) injection 25-50 mcg  25-50 mcg Intravenous Q2H PRN Anders Simmonds, MD      . heparin injection 5,000 Units  5,000 Units Subcutaneous Q8H Merlene Laughter F, NP   5,000 Units at 06/28/20 9024  . heparin sodium (porcine) injection 1,000-6,000 Units  1,000-6,000 Units Intracatheter PRN Dwana Melena, MD   2,400 Units at 06/27/20 1852  . MEDLINE mouth rinse  15 mL Mouth Rinse 10 times per day Baltazar Apo  S, MD   15 mL at 06/28/20 0608  . norepinephrine (LEVOPHED) 16 mg in 267m premix infusion  0-60 mcg/min Intravenous Titrated BCollene Gobble MD      . pantoprazole (PROTONIX) injection 40 mg  40 mg Intravenous QHS BCollene Gobble MD   40 mg at 06/27/20 2127  . polyethylene glycol (MIRALAX / GLYCOLAX) packet 17 g  17 g Oral Daily PRN DMerlene LaughterF, NP      . potassium chloride 10 mEq in 50 mL *CENTRAL LINE* IVPB  10 mEq Intravenous Q1 Hr x 4 LDwana Melena MD 50 mL/hr at 06/28/20 1000 Infusion Verify at 06/28/20 1000  . potassium chloride 10 mEq in 50 mL *CENTRAL LINE* IVPB  10 mEq Intravenous Q1 Hr x 4 Byrum, RRose Fillers MD      . prismasol BGK 4/2.5 infusion   CRRT Continuous LDwana Melena MD 1,500 mL/hr at 06/28/20 1022 New Bag at 06/28/20 1022  . sodium bicarbonate 150 mEq in sterile water 1,000 mL infusion   Intravenous Continuous JRayna Sexton PA-C 125 mL/hr at 06/28/20 1000 Infusion Verify at 06/28/20 1000  . sodium chloride 0.9 % primer fluid for CRRT   CRRT PRN LDwana Melena MD   Given at 06/27/20 2000      Review of Systems: Unable to obtain due to the patient's altered mental status  Physical Exam: Vitals:   06/28/20 0915 06/28/20 0930  BP: (!) 99/53 (!) 96/44  Pulse: (!) 57 (!) 58  Resp: 11 (!) 8  Temp: 98.24 F (36.8 C) 98.6 F (37 C)  SpO2: 100% 100%   Total I/O In: 1578.1 [I.V.:722.2; IV Piggyback:855.9] Out: 31.8 [Urine:32]  Intake/Output Summary (Last 24 hours) at 06/28/2020 1025 Last data filed at 06/28/2020 1000 Gross per 24 hour  Intake 5617.71 ml  Output 619.8 ml  Net 4997.91 ml   Constitutional: Chronically ill-appearing, lying in bed, no distress ENMT: ears and nose without scars or lesions, MMM CV: normal rate, no edema Respiratory: clear to auscultation, normal work of breathing Gastrointestinal: soft, non-tender, no palpable masses or hernias Skin: no visible lesions or rashes Psych: Does not answer questions, lethargic   Test Results I personally reviewed new and old clinical labs and radiology tests Lab Results  Component Value Date   NA 135 06/28/2020   K <2.0 (LL) 06/28/2020   CL 102 06/28/2020   CO2 13 (L) 06/28/2020   BUN 62 (H) 06/28/2020   CREATININE 5.48 (H) 06/28/2020   CALCIUM 6.2 (LL) 06/28/2020   ALBUMIN 2.4 (L) 06/28/2020   PHOS 3.5 06/28/2020

## 2020-06-28 NOTE — Progress Notes (Signed)
[place] Progress Note Patient Name: Chase [name] DOB: [birth year] MRN: [id number]   Date of Service  06/28/2020  HPI/Events of Note  ABG on room air = 7.307/18.4/112/8.9  eICU Interventions  Plan: 1. Continue NaHCO3 IV infusion.      Intervention Category Major Interventions: Acid-Base disturbance - evaluation and management  Chase [name] 06/28/2020, 2:42 AM

## 2020-06-28 NOTE — Progress Notes (Signed)
Date and time results received: 06/28/20 @ 0640              Critical Value: K < 2.0 and Ca 6.2  Name of Provider Notified: E-link RN and Nephrologist, Dr. Augustin Coupe.  Orders Received? Or Actions Taken?: Give one amp Calcium gluconate and will need to change all bags for CRRT to 4/2.5. Dr. Augustin Coupe will put in orders.

## 2020-06-28 NOTE — Progress Notes (Signed)
PCCM interval note  Mental status and wakefulness seem to be slowly improving.  He is moving more, moaning. Pressor needs have steadily increased to the day.  Received 1 LR bolus earlier. Had an episode of acute bradycardia to the 40s, now resolved.  Plan: -We will check his afternoon electrolytes early, replete aggressively. -Have been unable to place NG tube.  Will probably need to get a Dobbhoff placed in IR.  I deferred this for now because I do not want him to come off CVVH and because of his overall instability, pressor needs.  We will try to get his electrolytes replaced IV. -I will change his Levophed order to establish a goal SBP greater than 90, accept a lower MAP -Consider adding midodrine when we have enteral access -Based on his overall worsening in his shock state I will empirically change his ceftriaxone to vancomycin and meropenem pending culture data.  Independent critical care time 30 minutes  Baltazar Apo, MD, PhD 06/28/2020, 3:28 PM [facility] [phone] or if no answer before 7:00PM call [phone] For any issues after 7:00PM please call eLink [phone]

## 2020-06-28 NOTE — Progress Notes (Signed)
NAME:  Chase [name], MRN:  [id number], DOB:  11-Apr-1965, LOS: 1 ADMISSION DATE:  06/27/2020, CONSULTATION DATE: 06/27/2020 REFERRING MD: Dr. 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  He was admitted in January/February 2022 with acute on chronic renal failure related to obstructive nephropathy after his nephrostomy tubes were pulled.  He had internal ureteral stents replaced at that time.  A diverting loop colostomy was also performed for the fistula. He is on high-dose Neurontin and narcotics for chronic pain history of PTSD  He was found 3/29 on the floor by his son, unclear downtime.  Last seen well midday 3/28 when he was evaluated by EMS after a fall, question narcotics overdose.  He was not brought to the ED at that time.  His son reported that overall his health has been in steady decline, has difficulty with his ADL.  In the [facility] ED on 3/29 found to have a profound metabolic acidosis with acute on chronic renal failure, elevated CK, relative hypotension, mild transaminitis.  He was started on IV bicarbonate, IV fluid resuscitation, received empiric ceftriaxone.    Pertinent  Medical History   Past Medical History:  Diagnosis Date  . Cervical radiculopathy    with left sided weakness  . Chemical exposure    service related injury  . Chronic pain   . CKD (chronic kidney disease), stage IV ([place]) 10/17/2019  . COVID-19   . GERD (gastroesophageal reflux disease)   . GIB (gastrointestinal bleeding)   . Heart attack ([place])   . History of  CVA (cerebrovascular accident) 12/31/2019  . 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 History of stomach ulcers 05/19/2013   Formatting of this note might be different from the original. Attributed to chronic ibuprofen use  . Hodgkin lymphoma, unspecified, unspecified site ([place]) 12/10/2012   Formatting of this note might be different from the original. In remission.  . Hypokalemia   . Hypomagnesemia   . PTSD (post-traumatic stress disorder)   . Renal disorder    CKD  . Stroke ([place])   . Urinary retention    due to bladder outlet obstruction    Significant Hospital Events: Including procedures, antibiotic start and stop dates in addition to other pertinent events   . UDS 3/29 >> negative . CT abdomen/pelvis 3/29 >> bilateral renal atrophy, bilateral ureteral stents are noted in good position without any hydronephrosis.  Foley catheter present.  History gastric surgery with left lower quadrant colostomy. . Renal ultrasound 3/29 >> stable moderate bilateral hydroureteronephrosis, right ureteral stent extending to the renal pelvis, left ureteral stent faintly visualized.  Echogenic focus in the left kidney question nonobstructing stone or air bubble . CT pelvis/left hip 3/29 >> no evidence of hip fracture or dislocation normal joint space, decreased offset of the bilateral femoral head neck junctions without suspicious bony lesion . Chest x-ray 3/29 >> lungs clear, no infiltrates . Head CT 3/29 >> no acute intracranial abnormality, old right occipital lobe infarct . Blood cultures 3/29 ([facility]) >>  . CVVHD started 3/29 with significant improvement in severe metabolic acidosis . Developed shock 3/29 when CVVH started, on norepinephrine . NG  tube placed 3/30  Interim History / Subjective:  Norepinephrine uptitrated overnight, currently 36 Remains cephalopathic I/O+ 5 L total CVVH running, no UF Severe acidosis much improved Severe hypokalemia, replacing Hypomagnesemia CPK peaked  3/29  Objective   Blood pressure (!) 96/44, pulse (!) 58, temperature 98.6 F (37 C), resp. rate (!) 8, height 5\' 6"  (1.676 m), weight 52.8 kg, SpO2 100 %. CVP:  [1 mmHg-2 mmHg] 1 mmHg      Intake/Output Summary (Last 24 hours) at 06/28/2020 1009 Last data filed at 06/28/2020 1000 Gross per 24 hour  Intake 5617.71 ml  Output 619.8 ml  Net 4997.91 ml   Filed Weights   06/27/20 1223 06/27/20 1741 06/28/20 0414  Weight: 58.4 kg 52.4 kg 52.8 kg    Examination: General: Cachectic ill-appearing man, laying in bed HENT: Oropharynx dry, pupils equal, temporal wasting, no stridor Lungs: Clear bilaterally, no wheeze or crackles Cardiovascular: Regular, distant, no murmur Abdomen: Nondistended, colostomy in place, positive bowel sounds Extremities: No edema Neuro: Obtunded, he will wake to pain and moan.  Appears to be protecting his airway, no secretions.  Pupils react GU: Foley catheter in place   Resolved Hospital Problem list     Assessment & Plan:   Acute on chronic (stage IV-V) renal failure.  Long history of ureteral obstruction but current CT abdomen does not show hydronephrosis.  Question due to hypovolemia, rhabdomyolysis, question sepsis.  Severe anion gap metabolic acidosis with normal lactate, EtOH negative.  Presumed due to the above Probable rhabdomyolysis; CK down to 1233 on 3/30 Severe hypokalemia and hypomagnesemia on CVVHD -Volatile alcohols ordered 3/29, still pending -Appreciate urology review of CT abdomen, no role for percutaneous nephrostomy tubes at this point.  If he fails to improve could reimage to confirm -Continue CVVHD, no plans for volume removal.  Acidosis much improved -Follow BMP, urine output.  Question whether he will have a renal recovery after this injury -CPK appears to have peaked.  Will follow 1 more time 3/31 -Continue empiric ceftriaxone (day 2), follow culture data -Replace potassium, magnesium aggressively.  CVVHD adjusted by  nephrology  Shock, presumed hypovolemic, possibly septic -Following CVP and supported with IV fluids accordingly -Check echocardiogram -Continue empiric antibiotics -Check procalcitonin  Acute toxic metabolic encephalopathy.  Due to his metabolic disarray, renal failure.  Also consider toxic substances.  His UDS is negative -Volatile alcohols still pending from 3/29 -Plan to continue CVVH, reverse metabolic encephalopathy -Protecting his airway, does not need intubation at this time -Consider EEG, repeat head CT if mental status is not clearing with resolution of his metabolic disarray  CAD -On aspirin, Plavix, statin as an outpatient.  Will need to revisit once stabilized from acute issues  Mild transaminitis.  Question shock liver. -Follow LFT -Check acute hepatitis panel  Diverting colostomy, history of colovesicular fistula -Standard colostomy care  Chronic pain -On high-dose Neurontin at home.  Continue to hold but will need to discuss with pharmacy any potential risk for withdrawal at these doses. -Hold Zanaflex -Minimize narcotics as able -Would like to start low-dose Cymbalta 3/30, will check with pharmacy to see if this can go per tube  Protein calorie malnutrition -Place NG tube and initiate tube feeding 3/30  GOC -Discussed patient status, critical illness, very guarded prognosis with his son [name] at bedside and brother Gerald Stabs by phone on 3/29. We have determined that pt would want all medical care, would want dhort term MV to reverse anything acute. Would not want long term vent, would  not benefit from or want CPR. I have reflected this in the orders.    Best practice (right click and "Reselect all SmartList Selections" daily)  Diet:  Tube Feed  Pain/Anxiety/Delirium protocol (if indicated): No VAP protocol (if indicated): Not indicated DVT prophylaxis: Subcutaneous Heparin GI prophylaxis: PPI Glucose control:  SSI No Central venous access:  Yes, and it is  still needed Arterial line:  N/A Foley:  Yes, and it is still needed Mobility:  bed rest  PT consulted: N/A Last date of multidisciplinary goals of care discussion [06/27/20] Code Status:  limited Disposition: ICU  Labs   CBC: Recent Labs  Lab 06/27/20 1237 06/27/20 1830 06/28/20 0430 06/28/20 0535  WBC 13.2* 5.5 10.1 11.4*  NEUTROABS 11.7*  --   --   --   HGB 9.3* 8.4* 7.8* 7.8*  HCT 28.5* 25.6* 21.9* 21.9*  MCV 97.6 95.5 88.0 88.3  PLT 234 191 172 878    Basic Metabolic Panel: Recent Labs  Lab 06/27/20 1237 06/27/20 1830 06/28/20 0535  NA 139 140 135  K 5.1 4.9 <2.0*  CL 113* 114* 102  CO2 <7* <7* 13*  GLUCOSE 86 87 245*  BUN 102* 115* 62*  CREATININE 10.66* 10.16* 5.48*  CALCIUM 7.6* 6.7* 6.2*  MG  --   --  1.8  PHOS  --   --  3.5   GFR: Estimated Creatinine Clearance: 11.2 mL/min (A) (by C-G formula based on SCr of 5.48 mg/dL (H)). Recent Labs  Lab 06/27/20 1237 06/27/20 1238 06/27/20 1830 06/28/20 0430 06/28/20 0535  WBC 13.2*  --  5.5 10.1 11.4*  LATICACIDVEN  --  0.9 0.6  --   --     Liver Function Tests: Recent Labs  Lab 06/27/20 1237 06/27/20 1830 06/28/20 0535  AST 77* 84*  --   ALT 84* 77*  --   ALKPHOS 178* 166*  --   BILITOT 1.1 1.1  --   PROT 6.6 6.0*  --   ALBUMIN 2.9* 2.6* 2.4*   No results for input(s): LIPASE, AMYLASE in the last 168 hours. No results for input(s): AMMONIA in the last 168 hours.  ABG    Component Value Date/Time   PHART 7.330 (L) 06/28/2020 0504   PCO2ART 25.7 (L) 06/28/2020 0504   PO2ART 102 06/28/2020 0504   HCO3 13.1 (L) 06/28/2020 0504   TCO2 23 04/18/2020 1205   ACIDBASEDEF 11.3 (H) 06/28/2020 0504   O2SAT 97.9 06/28/2020 0504     Coagulation Profile: No results for input(s): INR, PROTIME in the last 168 hours.  Cardiac Enzymes: Recent Labs  Lab 06/27/20 1237 06/28/20 0535  CKTOTAL 2,210* 1,233*    HbA1C: Hgb A1c MFr Bld  Date/Time Value Ref Range Status  10/18/2019 12:13 AM 5.4 4.8  - 5.6 % Final    Comment:    (NOTE) Pre diabetes:          5.7%-6.4%  Diabetes:              >6.4%  Glycemic control for   <7.0% adults with diabetes     CBG: Recent Labs  Lab 06/27/20 1245 06/27/20 1927 06/27/20 2301 06/28/20 0037  GLUCAP 80 78 65* 85      Critical care time: 35 minutes     Baltazar Apo, MD, PhD 06/28/2020, 10:09 AM [facility] [phone] or if no answer before 7:00PM call [phone] For any issues after 7:00PM please call eLink [phone]

## 2020-06-28 NOTE — Progress Notes (Signed)
[place] Progress Note Patient Name: Chase [name] DOB: Jul 09, 1965 MRN: [id number]   Date of Service  06/28/2020  HPI/Events of Note  Hypotension - Remains on a Norepinephrine IV infusion. CVP = 2 and Hgb = 8.4. Albumin = 2.6.  eICU Interventions  Plan: 1. 25% Albumin 12.5 gm IV X 1 now.      Intervention Category Major Interventions: Other:  Yuchen Fedor Cornelia Copa 06/28/2020, 2:10 AM

## 2020-06-28 NOTE — TOC Initial Note (Signed)
Transition of Care [facility]) - Initial/Assessment Note    Patient Details  Name: Chase [name] MRN: [id number] Date of Birth: 04-03-1965  Transition of Care [facility]) CM/SW Contact:    Leeroy Cha, RN Phone Number: 06/28/2020, 7:35 AM  Clinical Narrative:                 55 year old man with a complicated past medical history that includes remote Hodgkin's lymphoma (2010-11), gastric bypass with bleeding anastomotic ulcer and colovesicular fistula that resulted in associated intraperitoneal abscess with drain.  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  He was admitted in January/February 2022 with acute on chronic renal failure related to obstructive nephropathy after his nephrostomy tubes were pulled.  He had internal ureteral stents replaced at that time.  A diverting loop colostomy was also performed for the fistula. He is on high-dose Neurontin and narcotics for chronic pain history of PTSD  He was found 3/29 on the floor by his son, unclear downtime.  Last seen well midday 3/28 when he was evaluated by EMS after a fall, question narcotics overdose.  He was not brought to the ED at that time.  His son reported that overall his health has been in steady decline, has difficulty with his ADL.  In the [facility] ED on 3/29 found to have a profound metabolic acidosis with acute on chronic renal failure, elevated CK, relative hypotension, mild transaminitis.  He was started on IV bicarbonate, IV fluid resuscitation, received empiric ceftriaxone.  Progression-CRRT,iv rocephin, iv levophed, iv nahco3 drip. Plan is to return to home with hhc if needed or with [facility] services. Expected Discharge Plan: [place] Barriers to Discharge: Continued Medical Work up   Patient Goals and CMS Choice Patient states their goals for this hospitalization and ongoing  recovery are:: to go home CMS Medicare.gov Compare Post Acute Care list provided to:: Patient Choice offered to / list presented to : Patient  Expected Discharge Plan and Services Expected Discharge Plan: [place]   Discharge Planning Services: CM Consult   Living arrangements for the past 2 months: Single Family Home                                      Prior Living Arrangements/Services Living arrangements for the past 2 months: Single Family Home Lives with:: Self Patient language and need for interpreter reviewed:: Yes Do you feel safe going back to the place where you live?: Yes      Need for Family Participation in Patient Care: Yes (Comment) Care giver support system in place?: Yes (comment) ([facility]   Criminal Activity/Legal Involvement Pertinent to Current Situation/Hospitalization: No - Comment as needed  Activities of Daily Living Home Assistive Devices/Equipment: Other (Comment),Walker (specify type),Eyeglasses,Cane (specify quad or straight),Raised toilet seat with rails (colostomy supplies) ADL Screening (condition at time of admission) Patient's cognitive ability adequate to safely complete daily activities?: Yes Is the patient deaf or have difficulty hearing?: No Does the patient have difficulty seeing, even when wearing glasses/contacts?: No Does the patient have difficulty concentrating, remembering, or making decisions?: Yes Patient able to express need for assistance with ADLs?: No Does the patient have difficulty dressing or bathing?: Yes Independently performs ADLs?: No Communication: Independent Dressing (OT): Needs assistance Is this  a change from baseline?: Pre-admission baseline Grooming: Needs assistance Is this a change from baseline?: Pre-admission baseline Feeding: Needs assistance Is this a change from baseline?: Pre-admission baseline Bathing: Needs assistance Is this a change from baseline?:  Pre-admission baseline Toileting: Needs assistance Is this a change from baseline?: Pre-admission baseline In/Out Bed: Needs assistance Is this a change from baseline?: Pre-admission baseline Walks in Home: Needs assistance Is this a change from baseline?: Pre-admission baseline Does the patient have difficulty walking or climbing stairs?: Yes Weakness of Legs: Both Weakness of Arms/Hands: Both  Permission Sought/Granted                  Emotional Assessment Appearance:: Appears older than stated age Attitude/Demeanor/Rapport: Engaged Affect (typically observed): Calm Orientation: : Oriented to Place,Oriented to Self,Oriented to  Time,Oriented to Situation Alcohol / Substance Use: Not Applicable Psych Involvement: No (comment)  Admission diagnosis:  Kidney failure [N19] Pain [R52] Sepsis with acute renal failure, due to unspecified organism, unspecified acute renal failure type, unspecified whether septic shock present ([place]) [A41.9, R65.20, N17.9] Patient Active Problem List   Diagnosis Date Noted  . Kidney failure 06/27/2020  . Pressure injury of skin 06/27/2020  . Physical deconditioning   . Cachexia ([place]) 04/20/2020  . Personality disorder  04/19/2020  . Diverticulitis of sigmoid colon with abscess s/p colectomy/colostomy [facility]) 04/18/2020 04/18/2020  . 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[facility]) 04/18/2020  . Acute respiratory failure with hypoxia ([place])   . DNR (do not resuscitate) discussion   . Palliative care by specialist   . Adult failure to thrive   . Sepsis with acute renal failure without septic shock ([place]) 03/31/2020  . Incisional hernia - epigastric 01/04/2020  . Chronic narcotic use 01/02/2020  . Anemia in chronic kidney disease 01/02/2020  . ATN (acute tubular necrosis) ([place]) 12/31/2019  . Candida UTI 12/31/2019  . Electrolyte abnormality 12/31/2019  . Increased anion gap metabolic acidosis 44/05/4740  . Microcytic anemia 12/31/2019  . ABLA (acute blood loss  anemia) 12/31/2019  . Anxiety and depression 12/31/2019  . History of CVA (cerebrovascular accident) 12/31/2019  . BMI less than 19,adult 12/31/2019  . Bilateral ureteral obstruction s/p perc nephrosotmy & stenting 12/30/2019  . AKI (acute kidney injury) ([place]) 12/24/2019  . Pelvic abscess with colovescial fistula 11/15/2019  . E. coli UTI (urinary tract infection) 11/15/2019  . PTSD (post-traumatic stress disorder)   . Chemical exposure   . Cervical radiculopathy   . Septic shock ([place]) 11/08/2019  . Protein-calorie malnutrition, severe 10/23/2019  . Cerebral embolism with cerebral infarction 10/18/2019  . Acute left-sided weakness 10/17/2019  . CKD (chronic kidney disease) stage 5, GFR less than 15 ml/min (HCC) 10/17/2019  . Chronic pain 10/17/2019  . Fall 10/17/2019  . Pseudomonas urinary tract infection 08/24/2019  . BPH (benign prostatic hyperplasia) 08/24/2019  . Acute kidney failure ([place]) 08/24/2019  . Anemia 08/24/2019  . Dyspnea 08/24/2019  . History of COVID-19 08/24/2019  . History of elevated PSA 08/24/2019  . 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 Hyponatremia 08/24/2019  . Metabolic acidosis 59/56/3875  . Skin lesion of neck 08/24/2019  . Toxic metabolic encephalopathy 64/33/2951  . Cervical spondylosis without myelopathy 06/19/2016  . Neck pain 09/04/2015  . History of stomach ulcers 05/19/2013  . Male erectile dysfunction 05/19/2013  . Hodgkin lymphoma, unspecified, unspecified site ([place]) 12/10/2012  . Iron deficiency anemia 12/10/2012  . Pernicious anemia 12/10/2012  . Arthralgia of multiple sites 04/20/2012  . Postlaminectomy syndrome, not elsewhere classified 04/20/2012  PCP:  Clinic, [place]:   [place], [place] - [place] [address] [place] [address] Phone: [phone] Fax: [phone]  [facility] [place], [place] [address] [place] [address] Phone: [phone] Fax: [phone]     Social Determinants of Health (SDOH) Interventions    Readmission Risk Interventions Readmission Risk Prevention Plan 12/25/2019 11/19/2019  Transportation Screening Complete Complete  Medication Review ([place]) Complete Complete  PCP or Specialist appointment within 3-5 days of discharge - Complete  HRI or [place] - Complete  SW Recovery Care/Counseling Consult - Complete  [place] - Complete  Some recent data might be hidden

## 2020-06-28 NOTE — Progress Notes (Signed)
Lab technician to print out most recent ABG results to place in physical chart since not showing on Epic at this time.

## 2020-06-28 NOTE — Progress Notes (Signed)
Date and time results received: 06/27/20 @ 2345    ABG RESULTS:       pH 7.245 Critical Value:  pCO2 14.6     pO2  122     HC03  6.0         Name of Provider Notified: Dr. Earlie Server at bedside when call received from lab.  Orders Received? Or Actions Taken?: No new orders, maintain current interventions.

## 2020-06-28 NOTE — Progress Notes (Signed)
Initial Nutrition Assessment  DOCUMENTATION CODES:   Severe malnutrition in context of chronic illness  INTERVENTION:  - once NGT placement has been verified: Pivot 1.5 @ 20 ml/hr to advance by 10 ml every 24 hours to reach goal rate of 60 ml/hr. - at goal rate, this regimen will provide 2160 kcal, 135 grams protein, and 1093 ml free water. - free water flush, if desired, to be per CCM and Nephrology.  Monitor magnesium, potassium, and phosphorus daily for at least 3 days, MD to replete as needed, as pt is at risk for refeeding syndrome given severe malnutrition, severe hypokalemia.   NUTRITION DIAGNOSIS:   Severe Malnutrition related to chronic illness (stage 4-5 CKD) as evidenced by severe fat depletion,severe muscle depletion.  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 On 3/29 he was found on the floor by his son; unknown amount of downtime. His soon reported a steady decline in patient's health and that he has difficulty with ADLs. [facility] to [facility] and found to have profound metabolic acidosis and acute on chronic renal failure, and mild transaminitis. [facility] was [facility].  Patient laying in bed with no family or visitors present. Patient was started on CRRT after HD cath placed in R IJ yesterday at [place].   Mentation was unable to be assessed by RN earlier this AM. Patient has  eyes closed throughout NFPE and does not vocalize in any way.   Weight today is 116 lb and weight on 03/03/20 was 139 lb. This indicates 23 lb weight loss (16.5% body weight) in the past ~4 months; significant for time frame.   Plan for NGT placement. Discussed with RN and CCM MD confirms ok for RD to initiate TF once tube placement confirmed.   Per notes: - acute on chronic renal failure on CRRT - severe metabolic acidosis - probable rhabdomyolysis - shock, possibly septic - acute toxic metabolic acidosis  - severe PCM - very guarded prognosis--limited code   Labs reviewed; CBG: 85 mg/dl, K: < 2 mmol/l, BUN: 62 mg/dl, creatinine: 5.48 mg/dl, Ca: 6.2 mg/dl, Mg and Phos WDL, LFTs elevated, GFR: 11 ml/min.  Medications reviewed; 40 mg IV protonix/day, 10 mEq IV KCl x4 runs 3/30.  IVF; 150 mEq sodium bicarb in sterile water @ 125 ml/hr.     NUTRITION - FOCUSED PHYSICAL EXAM:  Flowsheet Row Most Recent Value  Orbital Region Severe depletion  Upper Arm Region Severe depletion  Thoracic and Lumbar Region Unable to assess  Buccal Region Severe depletion  Temple Region Moderate depletion  Clavicle Bone Region Severe depletion  Clavicle and Acromion Bone Region Severe depletion  Scapular Bone Region Severe depletion  Dorsal Hand Unable to assess  [bilateral mittens]  Patellar Region Severe depletion  Anterior Thigh Region Severe depletion  Posterior Calf Region Unable to assess  [bilateral boots]  Edema (RD Assessment) None  Hair Reviewed  Eyes Unable to assess  Mouth Unable to assess  Skin Reviewed  Nails Unable to assess  Diet Order:   Diet Order            Diet NPO time specified  Diet effective now                 EDUCATION NEEDS:   Not appropriate for education at this time  Skin:  Skin Assessment: Skin Integrity Issues: Skin Integrity Issues:: Stage II Stage II: mid coccyx  Last BM:  PTA/unknown (patient with colostomy)  Height:   Ht Readings from  Last 1 Encounters:  06/27/20 5\' 6"  (1.676 m)    Weight:   Wt Readings from Last 1 Encounters:  06/28/20 52.8 kg     Estimated Nutritional Needs:  Kcal:  2030-2205 kcal Protein:  120-140 grams Fluid:  >/= 2 L/day      Chase Matin, MS, RD, LDN, CNSC Inpatient Clinical Dietitian RD pager # available in [place]  After hours/weekend pager # available in [facility]

## 2020-06-28 NOTE — Progress Notes (Signed)
Pharmacy Antibiotic Note  Chase [name] is a 55 y.o. male admitted on 06/27/2020 with sepsis.  Pharmacy has been consulted for vancomycin & meropenem dosing. Pt on CVVH. Wt 52.8 kg, Tm 100.58  Plan: Meropenem 1 gm IV q8 while on CVVH Vancomycin 1000 mg IV loading dose followed by vancomycin 750 mg IV q24 F/u CVVH, WBC, temp, culture data Vancomycin levels as needed  Height: 5\' 6"  (167.6 cm) Weight: 52.8 kg (116 lb 6.5 oz) IBW/kg (Calculated) : 63.8  Temp (24hrs), Avg:99.2 F (37.3 C), Min:93.38 F (34.1 C), Max:100.58 F (38.1 C)  Recent Labs  Lab 06/27/20 1237 06/27/20 1238 06/27/20 1830 06/28/20 0430 06/28/20 0535  WBC 13.2*  --  5.5 10.1 11.4*  CREATININE 10.66*  --  10.16*  --  5.48*  LATICACIDVEN  --  0.9 0.6  --   --     Estimated Creatinine Clearance: 11.2 mL/min (A) (by C-G formula based on SCr of 5.48 mg/dL (H)).    Allergies  Allergen Reactions  . Nsaids Other (See Comments)    Bleeding GI Ulceration  . Ciprofloxacin Nausea And Vomiting    Antimicrobials this admission: 3/29 CTX>>3/30 3/30 vanc>> 3/30 meropenem>> Dose adjustments this admission:  Microbiology results: 3/29 flu/covid - 3/29 MRSA - 3/30 Hep A B C all NR 3/29 BCx2: ngtd  Thank you for allowing pharmacy to be a part of this patient's care.  Eudelia Bunch, Pharm.D 06/28/2020 3:41 PM

## 2020-06-29 ENCOUNTER — Inpatient Hospital Stay (HOSPITAL_COMMUNITY): Payer: No Typology Code available for payment source

## 2020-06-29 DIAGNOSIS — R579 Shock, unspecified: Secondary | ICD-10-CM | POA: Diagnosis not present

## 2020-06-29 DIAGNOSIS — R410 Disorientation, unspecified: Secondary | ICD-10-CM

## 2020-06-29 DIAGNOSIS — N179 Acute kidney failure, unspecified: Secondary | ICD-10-CM | POA: Diagnosis not present

## 2020-06-29 DIAGNOSIS — G9340 Encephalopathy, unspecified: Secondary | ICD-10-CM | POA: Diagnosis not present

## 2020-06-29 DIAGNOSIS — N185 Chronic kidney disease, stage 5: Secondary | ICD-10-CM | POA: Diagnosis not present

## 2020-06-29 LAB — GLUCOSE, CAPILLARY
Glucose-Capillary: 108 mg/dL — ABNORMAL HIGH (ref 70–99)
Glucose-Capillary: 68 mg/dL — ABNORMAL LOW (ref 70–99)
Glucose-Capillary: 71 mg/dL (ref 70–99)
Glucose-Capillary: 74 mg/dL (ref 70–99)
Glucose-Capillary: 84 mg/dL (ref 70–99)
Glucose-Capillary: 91 mg/dL (ref 70–99)
Glucose-Capillary: 95 mg/dL (ref 70–99)

## 2020-06-29 LAB — CBC
HCT: 20.4 % — ABNORMAL LOW (ref 39.0–52.0)
Hemoglobin: 7.3 g/dL — ABNORMAL LOW (ref 13.0–17.0)
MCH: 31.2 pg (ref 26.0–34.0)
MCHC: 35.8 g/dL (ref 30.0–36.0)
MCV: 87.2 fL (ref 80.0–100.0)
Platelets: 118 10*3/uL — ABNORMAL LOW (ref 150–400)
RBC: 2.34 MIL/uL — ABNORMAL LOW (ref 4.22–5.81)
RDW: 15.9 % — ABNORMAL HIGH (ref 11.5–15.5)
WBC: 11.1 10*3/uL — ABNORMAL HIGH (ref 4.0–10.5)
nRBC: 0.3 % — ABNORMAL HIGH (ref 0.0–0.2)

## 2020-06-29 LAB — RENAL FUNCTION PANEL
Albumin: 2.2 g/dL — ABNORMAL LOW (ref 3.5–5.0)
Albumin: 2.2 g/dL — ABNORMAL LOW (ref 3.5–5.0)
Anion gap: 14 (ref 5–15)
Anion gap: 13 (ref 5–15)
BUN: 29 mg/dL — ABNORMAL HIGH (ref 6–20)
BUN: 20 mg/dL (ref 6–20)
CO2: 20 mmol/L — ABNORMAL LOW (ref 22–32)
CO2: 20 mmol/L — ABNORMAL LOW (ref 22–32)
Calcium: 7 mg/dL — ABNORMAL LOW (ref 8.9–10.3)
Calcium: 7.4 mg/dL — ABNORMAL LOW (ref 8.9–10.3)
Chloride: 102 mmol/L (ref 98–111)
Chloride: 104 mmol/L (ref 98–111)
Creatinine, Ser: 2.17 mg/dL — ABNORMAL HIGH (ref 0.61–1.24)
Creatinine, Ser: 1.66 mg/dL — ABNORMAL HIGH (ref 0.61–1.24)
GFR, Estimated: 35 mL/min — ABNORMAL LOW (ref >60)
GFR, Estimated: 48 mL/min — ABNORMAL LOW (ref >60)
Glucose, Bld: 92 mg/dL (ref 70–99)
Glucose, Bld: 78 mg/dL (ref 70–99)
Phosphorus: 2.5 mg/dL (ref 2.5–4.6)
Phosphorus: 2.4 mg/dL — ABNORMAL LOW (ref 2.5–4.6)
Potassium: 3.6 mmol/L (ref 3.5–5.1)
Potassium: 3.7 mmol/L (ref 3.5–5.1)
Sodium: 136 mmol/L (ref 135–145)
Sodium: 137 mmol/L (ref 135–145)

## 2020-06-29 LAB — LACTIC ACID, PLASMA: Lactic Acid, Venous: 0.9 mmol/L (ref 0.5–1.9)

## 2020-06-29 LAB — PROCALCITONIN: Procalcitonin: 12.88 ng/mL

## 2020-06-29 LAB — MAGNESIUM: Magnesium: 2.4 mg/dL (ref 1.7–2.4)

## 2020-06-29 MED ORDER — MIDAZOLAM HCL 2 MG/2ML IJ SOLN: 1.0000 mg | Freq: Once | INTRAMUSCULAR | Status: AC

## 2020-06-29 MED ORDER — FENTANYL CITRATE (PF) 100 MCG/2ML IJ SOLN
25.0000 ug | INTRAMUSCULAR | Status: DC | PRN
  Administered 2020-06-29 (×4): 50 ug via INTRAVENOUS
  Administered 2020-06-29: 25 ug via INTRAVENOUS
  Filled 2020-06-29 (×5): qty 2

## 2020-06-29 MED ORDER — SODIUM CHLORIDE 0.9% FLUSH
10.0000 mL | INTRAVENOUS | Status: DC | PRN
Start: 2020-06-29 — End: 2020-07-08
  Administered 2020-07-03 – 2020-07-07 (×4): 10 mL

## 2020-06-29 MED ORDER — CHLORHEXIDINE GLUCONATE 0.12 % MT SOLN
15.0000 mL | Freq: Two times a day (BID) | OROMUCOSAL | Status: DC
  Administered 2020-06-29 – 2020-07-07 (×10): 15 mL via OROMUCOSAL
  Filled 2020-06-29 (×11): qty 15

## 2020-06-29 MED ORDER — PANCRELIPASE (LIP-PROT-AMYL) 10440-39150 UNITS PO TABS
20880.0000 [IU] | ORAL_TABLET | Freq: Once | ORAL | Status: AC
  Administered 2020-06-29: 20880 [IU]
  Filled 2020-06-29: qty 2

## 2020-06-29 MED ORDER — MIDAZOLAM HCL 2 MG/2ML IJ SOLN
INTRAMUSCULAR | Status: AC
  Administered 2020-06-29: 1 mg via INTRAVENOUS
  Filled 2020-06-29: qty 2

## 2020-06-29 MED ORDER — MIDAZOLAM HCL 2 MG/2ML IJ SOLN
1.0000 mg | Freq: Once | INTRAMUSCULAR | Status: AC
  Administered 2020-06-29: 2 mg via INTRAVENOUS

## 2020-06-29 MED ORDER — DEXMEDETOMIDINE HCL IN NACL 200 MCG/50ML IV SOLN
0.4000 ug/kg/h | INTRAVENOUS | Status: DC
  Administered 2020-06-29 (×2): 0.4 ug/kg/h via INTRAVENOUS
  Administered 2020-06-29 – 2020-06-30 (×2): 0.5 ug/kg/h via INTRAVENOUS
  Administered 2020-06-30: 0.6 ug/kg/h via INTRAVENOUS
  Administered 2020-06-30: 0.5 ug/kg/h via INTRAVENOUS
  Administered 2020-06-30 – 2020-07-01 (×2): 0.7 ug/kg/h via INTRAVENOUS
  Administered 2020-07-01 (×2): 0.8 ug/kg/h via INTRAVENOUS
  Administered 2020-07-01: 0.6 ug/kg/h via INTRAVENOUS
  Administered 2020-07-01: 0.8 ug/kg/h via INTRAVENOUS
  Administered 2020-07-02: 0.7 ug/kg/h via INTRAVENOUS
  Filled 2020-06-29 (×12): qty 50

## 2020-06-29 MED ORDER — FENTANYL 2500MCG IN NS 250ML (10MCG/ML) PREMIX INFUSION: 0.0000 ug/h | INTRAVENOUS | Status: DC

## 2020-06-29 MED ORDER — ORAL CARE MOUTH RINSE
15.0000 mL | Freq: Two times a day (BID) | OROMUCOSAL | Status: DC
  Administered 2020-06-29 – 2020-07-02 (×4): 15 mL via OROMUCOSAL

## 2020-06-29 MED ORDER — PANCRELIPASE (LIP-PROT-AMYL) 10440-39150 UNITS PO TABS
20880.0000 [IU] | ORAL_TABLET | Freq: Once | ORAL | Status: AC
  Administered 2020-06-29: 20880 [IU]
  Filled 2020-06-29 (×2): qty 2

## 2020-06-29 MED ORDER — FENTANYL 25 MCG/HR TD PT72
1.0000 | MEDICATED_PATCH | TRANSDERMAL | Status: DC
  Administered 2020-06-29 – 2020-07-05 (×3): 1 via TRANSDERMAL
  Filled 2020-06-29 (×3): qty 1

## 2020-06-29 MED ORDER — SODIUM CHLORIDE 0.9% FLUSH
10.0000 mL | Freq: Two times a day (BID) | INTRAVENOUS | Status: DC
  Administered 2020-06-29 – 2020-07-07 (×14): 10 mL

## 2020-06-29 MED ORDER — SODIUM BICARBONATE 650 MG PO TABS
650.0000 mg | ORAL_TABLET | Freq: Once | ORAL | Status: AC
  Administered 2020-06-29: 650 mg
  Filled 2020-06-29: qty 1

## 2020-06-29 MED ORDER — HYDROMORPHONE HCL 1 MG/ML IJ SOLN
1.0000 mg | INTRAMUSCULAR | Status: DC | PRN
  Administered 2020-06-29 – 2020-06-30 (×11): 1 mg via INTRAVENOUS
  Administered 2020-07-01 – 2020-07-02 (×7): 2 mg via INTRAVENOUS
  Filled 2020-06-29 (×2): qty 2
  Filled 2020-06-29 (×2): qty 1
  Filled 2020-06-29: qty 2
  Filled 2020-06-29 (×5): qty 1
  Filled 2020-06-29 (×2): qty 2
  Filled 2020-06-29: qty 1
  Filled 2020-06-29: qty 2
  Filled 2020-06-29: qty 1
  Filled 2020-06-29: qty 2
  Filled 2020-06-29: qty 1
  Filled 2020-06-29: qty 2

## 2020-06-29 MED ORDER — MIDAZOLAM HCL 2 MG/2ML IJ SOLN
INTRAMUSCULAR | Status: AC
  Filled 2020-06-29: qty 2

## 2020-06-29 MED ORDER — GABAPENTIN 250 MG/5ML PO SOLN
300.0000 mg | Freq: Three times a day (TID) | ORAL | Status: DC
  Administered 2020-06-29 – 2020-06-30 (×2): 300 mg via ORAL
  Filled 2020-06-29 (×3): qty 6

## 2020-06-29 MED ORDER — MORPHINE SULFATE (PF) 2 MG/ML IV SOLN
1.0000 mg | INTRAVENOUS | Status: DC | PRN
  Administered 2020-06-29: 2 mg via INTRAVENOUS
  Filled 2020-06-29: qty 1

## 2020-06-29 MED ORDER — DEXTROSE 50 % IV SOLN
12.5000 g | INTRAVENOUS | Status: AC
  Administered 2020-06-29: 12.5 g via INTRAVENOUS
  Filled 2020-06-29: qty 50

## 2020-06-29 NOTE — Progress Notes (Addendum)
[facility] Progress Note Patient Name: Chase [name] DOB: 1965/11/24 MRN: [id number]   Date of Service  06/29/2020  HPI/Events of Note  Challenging situation -Severe agitation Screaming in pain , not directible Has required frequent pushes of fent -each lasting max of 66mins Morphine does not work No enteral access , Precedex has caused brady 58s  eICU Interventions  Versed 1-2 mg IV May need intubation for agitation/pain control, partial code status noted- so trying to avoid NT suction as needed     Intervention Category Major Interventions: Delirium, psychosis, severe agitation - evaluation and management Intermediate Interventions: Pain - evaluation and management  Chase [name] 06/29/2020, 3:42 AM

## 2020-06-29 NOTE — Progress Notes (Addendum)
Chaplain provided support with pt's family, Due to consult from RN  Son, Son's fiance, brother, sister, mother.   Support and prayers in ICU and waiting room.   Family shared pt's health narrative.  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  They ask for prayers for guidance as they make decisions about his care.    They note that his spouse, Emogene Morgan, died in 07/19/2014 and that Mr Apo has not been the same since.  They had been together since she was 12 and he was 16.   They also note that he had a child die.  They speak about these folks waiting for Mr Spr[place]lin.   Family understand how to request nurse contact chaplain for support 24 hours.    Spiritual Care will continue to follow as family are supporting Mr Dingley.    Thank you for this consult.   Jerene Pitch, MDiv, [facility]

## 2020-06-29 NOTE — Progress Notes (Signed)
NT suction done x1 for moderate amount of thin secretions. Tolerated well with no apparent complications.

## 2020-06-29 NOTE — Progress Notes (Addendum)
NAME:  Chase [name], MRN:  [id number], DOB:  [birth year], LOS: 2 ADMISSION DATE:  06/27/2020, CONSULTATION DATE: 06/27/2020 REFERRING MD: Dr. 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  He was admitted in January/February 2022 with acute on chronic renal failure related to obstructive nephropathy after his nephrostomy tubes were pulled.  He had internal ureteral stents replaced at that time.  A diverting loop colostomy was also performed for the fistula. He is on high-dose Neurontin and narcotics for chronic pain history of PTSD  He was found 3/29 on the floor by his son, unclear downtime.  Last seen well midday 3/28 when he was evaluated by EMS after a fall, question narcotics overdose.  He was not brought to the ED at that time.  His son reported that overall his health has been in steady decline, has difficulty with his ADL.  In [facility] ED on 3/29 found to have a profound metabolic acidosis with acute on chronic renal failure, elevated CK, relative hypotension, mild transaminitis.  He was started on IV bicarbonate, IV fluid resuscitation, received empiric ceftriaxone.    Pertinent  Medical History   Past Medical History:  Diagnosis Date  . Cervical radiculopathy    with left sided weakness  . Chemical exposure    service related injury  . Chronic pain   . CKD (chronic kidney disease), stage IV ([place]) 10/17/2019  . COVID-19   . GERD (gastroesophageal reflux disease)   . GIB (gastrointestinal bleeding)   . Heart attack ([place])   . History of  CVA (cerebrovascular accident) 12/31/2019  . 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 History of stomach ulcers 05/19/2013   Formatting of this note might be different from the original. Attributed to chronic ibuprofen use  . Hodgkin lymphoma, unspecified, unspecified site ([place]) 12/10/2012   Formatting of this note might be different from the original. In remission.  . Hypokalemia   . Hypomagnesemia   . PTSD (post-traumatic stress disorder)   . Renal disorder    CKD  . Stroke ([place])   . Urinary retention    due to bladder outlet obstruction    Significant Hospital Events: Including procedures, antibiotic start and stop dates in addition to other pertinent events   . UDS 3/29 >> negative . CT abdomen/pelvis 3/29 >> bilateral renal atrophy, bilateral ureteral stents are noted in good position without any hydronephrosis.  Foley catheter present.  History gastric surgery with left lower quadrant colostomy. . Renal ultrasound 3/29 >> stable moderate bilateral hydroureteronephrosis, right ureteral stent extending to the renal pelvis, left ureteral stent faintly visualized.  Echogenic focus in the left kidney question nonobstructing stone or air bubble . CT pelvis/left hip 3/29 >> no evidence of hip fracture or dislocation normal joint space, decreased offset of the bilateral femoral head neck junctions without suspicious bony lesion . Chest x-ray 3/29 >> lungs clear, no infiltrates . Head CT 3/29 >> no acute intracranial abnormality, old right occipital lobe infarct . Blood cultures 3/29 ([facility]) >>  . CVVHD started 3/29 with significant improvement in severe metabolic acidosis . Developed shock 3/29 when CVVH started, on norepinephrine . NG  tube attempted 3/30, unsuccessful . Echocardiogram 3/30 >> LVEF 60-65% without regional wall motion abnormality, no diastolic dysfunction.  Normal RV size and function, normal PASP.  No mention of valvular abnormality  Interim History / Subjective:   Much more  active his metabolic disarray has been resolving with CVVHD.  Unfortunately has not been directable, seems to be quite uncomfortable, calling out in pain and moving randomly.  He has received narcotics with temporary relief, Versed x1 early this morning.  Required NTS x2, currently on 1.00 mask.  Subject of possible intubation for airway protection and pain control discussed with the patient's son [name] overnight. Pressors have been weaned completely off CVVH still running I/O+ 6.6 L total   Objective   Blood pressure 117/61, pulse 67, temperature 98.42 F (36.9 C), resp. rate 19, height 5\' 6"  (1.676 m), weight 57.5 kg, SpO2 99 %. CVP:  [0 mmHg-8 mmHg] 8 mmHg      Intake/Output Summary (Last 24 hours) at 06/29/2020 0752 Last data filed at 06/29/2020 0700 Gross per 24 hour  Intake 4188.45 ml  Output 956.6 ml  Net 3231.85 ml   Filed Weights   06/27/20 1741 06/28/20 0414 06/29/20 0500  Weight: 52.4 kg 52.8 kg 57.5 kg    Examination: General: Cachectic ill-appearing man, laying in bed and in discomfort.  Calling out in what appears to be pain HENT: Oropharynx dry.  Pupils are 5 mm and equal.  Some upper airway secretions that were easily suctioned by NTS Lungs: Clear bilaterally, no wheezes or crackles Cardiovascular: Distant, regular, no murmur, 80s Abdomen: Nondistended, possible tenderness to palpation in the upper quadrants.  Ostomy in place Extremities: No edema Neuro: Eyes closed.  He is moving his head, bilateral upper extremities, bilateral lower extremities randomly.  He will not wake up or follow commands.  Did not open eyes to request.  Moaning in discomfort GU: Foley catheter in place   Resolved Hospital Problem list     Assessment & Plan:   Acute on chronic (stage IV-V) renal failure.  Long history of ureteral obstruction but current CT abdomen does not show hydronephrosis.  Question due to hypovolemia, rhabdomyolysis, question sepsis.  Severe anion gap metabolic  acidosis with normal lactate, EtOH negative.  Presumed due to the above.  Volatile alcohol screen picked up acetone, no others. Probable rhabdomyolysis; CK down to 1233 on 3/30 Severe hypokalemia and hypomagnesemia on CVVHD -Urology reviewed his CT abdomen and there was no clear role for percutaneous nephrostomy tubes.  Depending on course could reimage -Tolerating CVVHD with improvement in his metabolic status.  He made 850 cc urine in the last 24 hours, question whether he might have renal recovery -CPK peaked on 3/29 -Antibiotics as below -Follow electrolytes and replace aggressively -stop bicarb gtt 3/31  Acute respiratory failure.  Principally due to airway protection and secretions.  Chest x-ray 3/31 reassuring -Difficult situation.  It is neurological status and airway protection that are compromising respiratory status.   -Has tolerated NTS and could repeat -Given his debilitated state and underlying medical issues not clear to me that he would survive intubation and mechanical ventilation to a successful extubation, acceptable quality of life.  I will need to discuss this with his family today 3/31.  Based on the overall philosophy for his care it may be most appropriate to defer ventilation.  Shock, presumed hypovolemic, possibly septic.  Echocardiogram 3/30 was normal.  Pressor needs markedly improved as we have impacted his metabolic status, now off -Follow blood pressure  as we try to better control his agitation, discomfort -stress dose steroids started 3/30 > plan to d/c on 3/31 to avoid any contribution to delirium -Follow CVP -Ceftriaxone changed to vancomycin/meropenem empirically on 3/30 due to his hemodynamic worsening. Cx pending but consider stopping vanco soon.  -Follow blood cultures from [facility], still negative -Following procalcitonin, elevated but improving  Acute toxic metabolic encephalopathy.  Due to his metabolic disarray, renal failure.  Also consider toxic  substances.  His UDS is negative.  Volatile alcohols positive acetone, unclear significance Chronic pain -Plan to continue CVVHD, continue to treat his metabolic encephalopathy -His encephalopathy and also apparent acute on chronic pain are impacting airway protection as above -He is on high-dose Neurontin at home.  Currently holding but consider potential risk for withdrawal at this high dose.  We do not have enteral access but would like to restart as soon as we are able. -Need to restart his home Cymbalta when we have enteral access -No neck stiffness.  Could consider meningitis but he has been on antibiotics since admission (including ceftriaxone initially).  Unclear that LP would even be possible given his agitation, respiratory status -Could consider repeat head imaging depending on neurological course  CAD -On aspirin, Plavix, statin as an outpatient.   -Will restart when we have enteral access  Mild transaminitis.  Question shock liver.  Hepatitis panel negative -Follow LFT intermittently  Diverting colostomy, history of colovesicular fistula -Standard colostomy care  Protein calorie malnutrition -Needs tube feeding when we are able to get access  GOC -Discussed patient status, critical illness, very guarded prognosis with his son [name] at bedside and brother Gerald Stabs by phone on 3/29 and again on 3/31.  We are trying to get him through this metabolic illness and also his respiratory decompensation noninvasively.  I recommended that I do not think he would withstand CPR and they agree.  We talked on 3/31 specifically about mechanical ventilation which we had initially left on the table as possible therapy.  Given the course, not clear to me that extending his life with MV would allow him to get through the overall illness.  Further not sure that he would be able to be successfully extubated given his acute issues superimposed on chronic debilitated state.  For this reason I have  recommended that we defer mechanical ventilation.  Both [name] and [place] agree.  They have discussed with other family as well and all are in agreement.  We will not intubate.  If we are unable to get him stabilized or achieve any improvement then we will talk about a dedicated transition to comfort care.   Best practice (right click and "Reselect all SmartList Selections" daily)  Diet:  NPO Pain/Anxiety/Delirium protocol (if indicated): Yes (RASS goal 0) VAP protocol (if indicated): Not indicated DVT prophylaxis: Subcutaneous Heparin GI prophylaxis: PPI Glucose control:  SSI No Central venous access:  Yes, and it is still needed Arterial line:  N/A Foley:  Yes, and it is still needed Mobility:  bed rest  PT consulted: N/A Last date of multidisciplinary goals of care discussion [06/29/20] Code Status:  DNR Disposition: ICU  Labs   CBC: Recent Labs  Lab 06/27/20 1237 06/27/20 1830 06/28/20 0430 06/28/20 0535 06/29/20 0500  WBC 13.2* 5.5 10.1 11.4* 11.1*  NEUTROABS 11.7*  --   --   --   --   HGB 9.3* 8.4* 7.8* 7.8* 7.3*  HCT 28.5* 25.6* 21.9* 21.9* 20.4*  MCV 97.6 95.5 88.0 88.3 87.2  PLT  234 191 172 158 118*    Basic Metabolic Panel: Recent Labs  Lab 06/27/20 1237 06/27/20 1830 06/28/20 0535 06/28/20 1517 06/29/20 0500  NA 139 140 135 135 136  K 5.1 4.9 <2.0* 3.1* 3.6  CL 113* 114* 102 100 102  CO2 <7* <7* 13* 20* 20*  GLUCOSE 86 87 245* 95 92  BUN 102* 115* 62* 48* 29*  CREATININE 10.66* 10.16* 5.48* 3.54* 2.17*  CALCIUM 7.6* 6.7* 6.2* 6.5* 7.0*  MG  --   --  1.8 1.9 2.4  PHOS  --   --  3.5 2.8 2.5   GFR: Estimated Creatinine Clearance: 30.9 mL/min (A) (by C-G formula based on SCr of 2.17 mg/dL (H)). Recent Labs  Lab 06/27/20 1238 06/27/20 1830 06/28/20 0430 06/28/20 0535 06/28/20 1049 06/29/20 0500  PROCALCITON  --   --   --   --  31.73 12.88  WBC  --  5.5 10.1 11.4*  --  11.1*  LATICACIDVEN 0.9 0.6  --   --   --  0.9    Liver Function  Tests: Recent Labs  Lab 06/27/20 1237 06/27/20 1830 06/28/20 0535 06/28/20 1517 06/29/20 0500  AST 77* 84*  --   --   --   ALT 84* 77*  --   --   --   ALKPHOS 178* 166*  --   --   --   BILITOT 1.1 1.1  --   --   --   PROT 6.6 6.0*  --   --   --   ALBUMIN 2.9* 2.6* 2.4* 2.6* 2.2*   No results for input(s): LIPASE, AMYLASE in the last 168 hours. No results for input(s): AMMONIA in the last 168 hours.  ABG    Component Value Date/Time   PHART 7.330 (L) 06/28/2020 0504   PCO2ART 25.7 (L) 06/28/2020 0504   PO2ART 102 06/28/2020 0504   HCO3 13.1 (L) 06/28/2020 0504   TCO2 23 04/18/2020 1205   ACIDBASEDEF 11.3 (H) 06/28/2020 0504   O2SAT 97.9 06/28/2020 0504     Coagulation Profile: No results for input(s): INR, PROTIME in the last 168 hours.  Cardiac Enzymes: Recent Labs  Lab 06/27/20 1237 06/28/20 0535  CKTOTAL 2,210* 1,233*    HbA1C: Hgb A1c MFr Bld  Date/Time Value Ref Range Status  10/18/2019 12:13 AM 5.4 4.8 - 5.6 % Final    Comment:    (NOTE) Pre diabetes:          5.7%-6.4%  Diabetes:              >6.4%  Glycemic control for   <7.0% adults with diabetes     CBG: Recent Labs  Lab 06/28/20 1558 06/28/20 1942 06/28/20 2333 06/29/20 0329 06/29/20 0744  GLUCAP 101* 122* 86 84 108*      Critical care time: 60 minutes     Baltazar Apo, MD, PhD 06/29/2020, 7:52 AM [facility] [phone] or if no answer before 7:00PM call [phone] For any issues after 7:00PM please call eLink [phone]

## 2020-06-29 NOTE — Progress Notes (Signed)
PCCM Interval Note  Patient has continued to call out through the day. It sounds like he is having pain, and we have been treating as such with fentanyl. I placed a fentanyl patch as well to give more sustained delivery. It seems like there is still a component of encephalopathy here - he appears to be perseverating as opposed to just calling out in pain. We have off/on used precedex, but have had to adjust on and off depending on BP and HR. When precedex and fentanyl are in effect he is bradycardic.   I believe his metabolic disarray from renal failure has largely been addressed, and had hoped / expected his MS to be much more clear than this. Note he is on large doses of gabapentin at home, and some of his delirium and agitation could be due to withdrawal. We have only this afternoon been able to get an NGT to give gabapentin. It is possible that restarting may allow some resolution of his delirium.   I have discussed these issues with his son Chase [name], brother Chase [name] at bedside. They understand that we are trying to treat potential medical contributors to his delirium, that his MS may improve with the gabapentin. They agree with trying this. At the same time we all understand that his delirium and apparent discomfort may persist. They are clear that they do not want Chase [name] to suffer. We will continue to try to temporize, controlling pain but also assessing for any resolution of encephalopathy / delirium as he gets the gabapentin. If it becomes clear that we are unable to impact his agitated delirium with gabapentin +/- precedex, then they would not want him intubated but instead would want to concentrate on his comfort. I have assured them that we will honor these wishes.   Independent CC time 50 minutes  Baltazar Apo, MD, PhD 06/29/2020, 4:41 PM [facility] [phone] or if no answer before 7:00PM call [phone] For any issues after 7:00PM please call eLink [phone]

## 2020-06-29 NOTE — Progress Notes (Signed)
NTS x1 down left nare for small amount of pink frothy secretions.

## 2020-06-29 NOTE — Progress Notes (Signed)
[facility] Progress Note Patient Name: Chase [name] DOB: 1965/12/31 MRN: [id number]   Date of Service  06/29/2020  HPI/Events of Note  Review of abdominal film reveals the tip of weighted enteric tube projects over the lower medial abdomen, likely within a J-shaped stomach.  eICU Interventions  OK to use gastric tube.      Intervention Category Major Interventions: Other:  Lysle Dingwall 06/29/2020, 11:38 PM

## 2020-06-29 NOTE — Progress Notes (Signed)
[facility] Progress Note Patient Name: Chase [name] DOB: 01-Aug-1965 MRN: [id number]   Date of Service  06/29/2020  HPI/Events of Note  Nursing unable to flush NGT. Request for abdominal film to check placement.  eICU Interventions  Plan: 1. Portable abdominal film STAT.     Intervention Category Major Interventions: Other:  Lysle Dingwall 06/29/2020, 10:32 PM

## 2020-06-29 NOTE — Progress Notes (Signed)
[facility] ROUNDING NOTE   Subjective:   Brief history.  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  He also has a remote history of Hodgkin's lymphoma. He had acute on chronic renal dysfunction with severe metabolic acidosis and altered mental status.  He was started on CRRT therapy 06/27/2020.  Blood pressure 83/53 pulse 53 temperature 96 O2 sats 96% 15 L non rebreather  IV norepinephrine IV sodium bicarbonate  Sodium 136 potassium 3.6 chloride 106 CO2 20 BUN 29 creatinine 2.17 glucose 92.  Albumin 2.2  hemoglobin 7.3    Objective:  Vital signs in last 24 hours:  Temp:  [97.52 F (36.4 C)-100.58 F (38.1 C)] 97.52 F (36.4 C) (03/31 0930) Pulse Rate:  [49-110] 56 (03/31 0930) Resp:  [9-26] 17 (03/31 0930) BP: (83-173)/(36-113) 83/52 (03/31 0930) SpO2:  [88 %-100 %] 100 % (03/31 0930) Weight:  [57.5 kg] 57.5 kg (03/31 0500)  Weight change: -0.9 kg Filed Weights   06/27/20 1741 06/28/20 0414 06/29/20 0500  Weight: 52.4 kg 52.8 kg 57.5 kg    Intake/Output: I/O last 3 completed shifts: In: 8028 [I.V.:4996.8; Other:35; IV Piggyback:2996.3] Out: 1444.6 [Urine:1342; Other:2.6; Stool:100]   Intake/Output this shift:  Total I/O In: 187.2 [I.V.:67.2; Other:20; IV Piggyback:100.1] Out: 68 [Urine:68]  Constitutional: Chronically ill-appearing, lying in bed, no distress ENMT: ears and nose without scars or lesions, MMM CV: normal rate, no edema Respiratory: clear to auscultation, normal work of breathing Gastrointestinal: soft, non-tender, no palpable masses or hernias Skin: no visible lesions or rashes Psych: Does not answer questions, lethargic  Basic Metabolic Panel: Recent Labs  Lab 06/27/20 1237 06/27/20 1830  06/28/20 0535 06/28/20 1517 06/29/20 0500  NA 139 140 135 135 136  K 5.1 4.9 <2.0* 3.1* 3.6  CL 113* 114* 102 100 102  CO2 <7* <7* 13* 20* 20*  GLUCOSE 86 87 245* 95 92  BUN 102* 115* 62* 48* 29*  CREATININE 10.66* 10.16* 5.48* 3.54* 2.17*  CALCIUM 7.6* 6.7* 6.2* 6.5* 7.0*  MG  --   --  1.8 1.9 2.4  PHOS  --   --  3.5 2.8 2.5    Liver Function Tests: Recent Labs  Lab 06/27/20 1237 06/27/20 1830 06/28/20 0535 06/28/20 1517 06/29/20 0500  AST 77* 84*  --   --   --   ALT 84* 77*  --   --   --   ALKPHOS 178* 166*  --   --   --   BILITOT 1.1 1.1  --   --   --   PROT 6.6 6.0*  --   --   --   ALBUMIN 2.9* 2.6* 2.4* 2.6* 2.2*   No results for input(s): LIPASE, AMYLASE in the last 168 hours. No results for input(s): AMMONIA in the last 168 hours.  CBC: Recent Labs  Lab 06/27/20 1237 06/27/20 1830 06/28/20 0430 06/28/20 0535 06/29/20 0500  WBC 13.2* 5.5 10.1 11.4* 11.1*  NEUTROABS 11.7*  --   --   --   --   HGB 9.3* 8.4* 7.8* 7.8* 7.3*  HCT 28.5* 25.6* 21.9* 21.9* 20.4*  MCV 97.6 95.5 88.0 88.3 87.2  PLT 234 191 172 158 118*    Cardiac Enzymes: Recent Labs  Lab 06/27/20 1237 06/28/20 0535  CKTOTAL 2,210* 1,233*  BNP: Invalid input(s): POCBNP  CBG: Recent Labs  Lab 06/28/20 1558 06/28/20 1942 06/28/20 2333 06/29/20 0329 06/29/20 0744  GLUCAP 101* 122* 86 84 108*    Microbiology: Results for orders placed or performed during the hospital encounter of 06/27/20  Culture, blood (routine x 2)     Status: None (Preliminary result)   Collection Time: 06/27/20 12:54 PM   Specimen: BLOOD  Result Value Ref Range Status   Specimen Description BLOOD BLOOD RIGHT ARM  Final   Special Requests   Final    Blood Culture adequate volume BOTTLES DRAWN AEROBIC AND ANAEROBIC   Culture   Final    NO GROWTH 2 DAYS Performed at [facility], [address], [place], [place] [address]    Report Status PENDING  Incomplete  Culture, blood (routine x 2)     Status:  None (Preliminary result)   Collection Time: 06/27/20 12:54 PM   Specimen: BLOOD  Result Value Ref Range Status   Specimen Description BLOOD BLOOD LEFT ARM  Final   Special Requests   Final    Blood Culture adequate volume BOTTLES DRAWN AEROBIC AND ANAEROBIC   Culture   Final    NO GROWTH 2 DAYS Performed at [facility], [address] [place], [place] [address]    Report Status PENDING  Incomplete  Resp Panel by RT-PCR (Flu A&B, Covid) Nasopharyngeal Swab     Status: None   Collection Time: 06/27/20  1:25 PM   Specimen: Nasopharyngeal Swab; Nasopharyngeal(NP) swabs in vial transport medium  Result Value Ref Range Status   SARS Coronavirus 2 by RT PCR NEGATIVE NEGATIVE Final    Comment: (NOTE) SARS-CoV-2 target nucleic acids are NOT DETECTED.  The SARS-CoV-2 RNA is generally detectable in upper respiratory specimens during the acute phase of infection. The lowest concentration of SARS-CoV-2 viral copies this assay can detect is 138 copies/mL. A negative result does not preclude SARS-Cov-2 infection and should not be used as the sole basis for treatment or other patient management decisions. A negative result may occur with  improper specimen collection/handling, submission of specimen other than nasopharyngeal swab, presence of viral mutation(s) within the areas targeted by this assay, and inadequate number of viral copies(<138 copies/mL). A negative result must be combined with clinical observations, patient history, and epidemiological information. The expected result is Negative.  Fact Sheet for Patients:  EntrepreneurPulse.com.au  Fact Sheet for Healthcare Providers:  IncredibleEmployment.be  This test is no t yet approved or cleared by the [place] FDA and  has been authorized for detection and/or diagnosis of SARS-CoV-2 by FDA under an Emergency Use Authorization (EUA). This EUA will remain  in effect (meaning this test can be  used) for the duration of the COVID-19 declaration under Section 564(b)(1) of the Act, 21 U.S.C.section 360bbb-3(b)(1), unless the authorization is terminated  or revoked sooner.       Influenza A by PCR NEGATIVE NEGATIVE Final   Influenza B by PCR NEGATIVE NEGATIVE Final    Comment: (NOTE) The Xpert Xpress SARS-CoV-2/FLU/RSV plus assay is intended as an aid in the diagnosis of influenza from Nasopharyngeal swab specimens and should not be used as a sole basis for treatment. Nasal washings and aspirates are unacceptable for Xpert Xpress SARS-CoV-2/FLU/RSV testing.  Fact Sheet for Patients: EntrepreneurPulse.com.au  Fact Sheet for Healthcare Providers: IncredibleEmployment.be  This test is not yet approved or cleared by the [place] FDA and has been authorized for detection and/or diagnosis of SARS-CoV-2 by FDA under an Emergency  Use Authorization (EUA). This EUA will remain in effect (meaning this test can be used) for the duration of the COVID-19 declaration under Section 564(b)(1) of the Act, 21 U.S.C. section 360bbb-3(b)(1), unless the authorization is terminated or revoked.  Performed at [facility], [address], [place], [place] [address]   MRSA PCR Screening     Status: None   Collection Time: 06/27/20  6:00 PM   Specimen: Nasal Mucosa; Nasopharyngeal  Result Value Ref Range Status   MRSA by PCR NEGATIVE NEGATIVE Final    Comment:        The GeneXpert MRSA Assay (FDA approved for NASAL specimens only), is one component of a comprehensive MRSA colonization surveillance program. It is not intended to diagnose MRSA infection nor to guide or monitor treatment for MRSA infections. Performed at [facility], [place] [address] [place],  [address]     Coagulation Studies: No results for input(s): LABPROT, INR in the last 72 hours.  Urinalysis: Recent Labs    06/27/20 1544  COLORURINE YELLOW   LABSPEC 1.014  PHURINE 6.0  GLUCOSEU NEGATIVE  HGBUR MODERATE*  BILIRUBINUR NEGATIVE  KETONESUR 5*  PROTEINUR 100*  NITRITE NEGATIVE  LEUKOCYTESUR MODERATE*      Imaging: CT ABDOMEN PELVIS WO CONTRAST  Result Date: 06/27/2020 CLINICAL DATA:  Acute renal failure. EXAM: CT ABDOMEN AND PELVIS WITHOUT CONTRAST TECHNIQUE: Multidetector CT imaging of the abdomen and pelvis was performed following the standard protocol without IV contrast. COMPARISON:  May 01, 2020. FINDINGS: Lower chest: No acute abnormality. Hepatobiliary: No focal liver abnormality is seen. Status post cholecystectomy. No biliary dilatation. Pancreas: Unremarkable. No pancreatic ductal dilatation or surrounding inflammatory changes. Spleen: Normal in size without focal abnormality. Adrenals/Urinary Tract: Adrenal glands appear normal. Mild bilateral renal atrophy is noted. Bilateral ureteral stents are noted in grossly good position. No hydronephrosis is noted. Foley catheter is noted within the urinary bladder. Stomach/Bowel: Status post gastric surgery. Colostomy is noted in left lower quadrant. The appendix appears normal. There is no evidence of bowel obstruction or inflammation. Vascular/Lymphatic: Aortic atherosclerosis. No enlarged abdominal or pelvic lymph nodes. Reproductive: Stable prostatic enlargement. Other: No abdominal wall hernia or abnormality. No abdominopelvic ascites. Musculoskeletal: No acute or significant osseous findings. IMPRESSION: Bilateral ureteral stents are in grossly good position. No hydronephrosis is noted. Stable prostatic enlargement. Aortic Atherosclerosis (ICD10-I70.0). Electronically Signed   By: Marijo Conception M.D.   On: 06/27/2020 16:16   CT Head Wo Contrast  Result Date: 06/27/2020 CLINICAL DATA:  Fall.  Altered mental status. EXAM: CT HEAD WITHOUT CONTRAST TECHNIQUE: Contiguous axial images were obtained from the base of the skull through the vertex without intravenous contrast.  COMPARISON:  CT head dated October 18, 2019. FINDINGS: Brain: No evidence of acute infarction, hemorrhage, hydrocephalus, extra-axial collection or mass lesion/mass effect. Small area of encephalomalacia in the right occipital lobe from prior infarct. Vascular: Atherosclerotic vascular calcification of the carotid siphons. No hyperdense vessel. Skull: Normal. Negative for fracture or focal lesion. Sinuses/Orbits: No acute finding. Other: None. IMPRESSION: 1. No acute intracranial abnormality. 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 Electronically Signed   By: Titus Dubin M.D.   On: 06/27/2020 13:44   US Renal  Result Date: 06/27/2020 CLINICAL DATA:  Acute kidney failure. History of nephrostomy tubes. Unresponsive. EXAM: RENAL / URINARY TRACT ULTRASOUND COMPLETE COMPARISON:  Most recent abdominal imaging 03/31/2020 FINDINGS: Right Kidney: Renal measurements: 7.4 x 4.1 x 4.9 cm = volume: 77 mL. Moderate hydronephrosis. Stent is present in  the right renal pelvis, presumed ureteral stent. There is thinning of the renal parenchyma with increased echogenicity. No focal lesion or evidence of stone. Left Kidney: Renal measurements: 8.8 x 5.2 x 5.2 cm = volume: 123 mL. Moderate hydronephrosis. The ureteral stent is faintly visualized, with tip in the lower renal pelvis. Mild thinning of the renal parenchyma and increased echogenicity. An 8 mm echogenic focus in the lateral kidney may represent a stone or air bubble when no evidence of focal mass. Compared with prior exam. Bladder: Foley catheter decompresses the bladder. Bladder appears thick walled, but is not well assessed. Other: None. IMPRESSION: 1. Moderate bilateral hydroureteronephrosis, similar in degree to prior imaging. 2. Right ureteral stent with proximal pigtail in the renal pelvis. The left ureteral stent is faintly visualized in the low pelvis. 3. Increased renal echogenicity consistent with chronic medical renal disease. 4. Echogenic focus in the left  kidney may represent a nonobstructing stone or air bubble when compared with prior CT. Electronically Signed   By: Keith Rake M.D.   On: 06/27/2020 15:21   DG CHEST PORT 1 VIEW  Result Date: 06/29/2020 CLINICAL DATA:  Respiratory crackles EXAM: PORTABLE CHEST 1 VIEW COMPARISON:  06/27/2020 FINDINGS: Unchanged right upper lobe opacities. Position of the right IJ central venous catheter tip remains in the mid SVC. Cardiomediastinal contours are normal. No pleural effusion or pneumothorax. IMPRESSION: Unchanged right upper lobe opacities. Electronically Signed   By: Ulyses Jarred M.D.   On: 06/29/2020 03:34   DG CHEST PORT 1 VIEW  Result Date: 06/27/2020 CLINICAL DATA:  Central line placement EXAM: PORTABLE CHEST 1 VIEW COMPARISON:  None. FINDINGS: Central venous catheter tip is in the lower SVC. No pneumothorax. Hazy opacities noted in the right upper lobe. No pleural effusion. IMPRESSION: Central venous catheter tip in the lower SVC. New hazy opacity in the right upper lobe may be a composite shadow artifact. Consider upright PA and lateral radiographs when possible. Electronically Signed   By: Ulyses Jarred M.D.   On: 06/27/2020 19:20   DG Chest Portable 1 View  Result Date: 06/27/2020 CLINICAL DATA:  Agitated, found unresponsive earlier in the day. EXAM: PORTABLE CHEST 1 VIEW COMPARISON:  Chest x-ray from April 02, 2020 FINDINGS: Trachea midline. Cardiomediastinal contours and hilar structures are normal. Lungs are clear.  No sign of effusion on frontal view. EKG leads project over the chest. On limited assessment no acute skeletal process IMPRESSION: No acute cardiopulmonary disease. Electronically Signed   By: Zetta Bills M.D.   On: 06/27/2020 13:55   ECHOCARDIOGRAM COMPLETE  Result Date: 06/28/2020    ECHOCARDIOGRAM REPORT   Patient Name:   MARTAVIS GURNEY Date of Exam: 06/28/2020 Medical Rec #:  [id number]       Height:       66.0 in Accession #:    [id number]      Weight:       116.4 lb  Date of Birth:  1966-04-01       BSA:          1.589 m Patient Age:    1 years        BP:           144/71 mmHg Patient Gender: M               HR:           63 bpm. Exam Location:  Inpatient Procedure: 2D Echo, Cardiac Doppler and Color Doppler Indications:    Dyspnea  R06.00  History:        Patient has prior history of Echocardiogram examinations, most                 recent 10/19/2019. Previous Myocardial Infarction; Stroke.                 COVID-19. CKD. GERD. Cancer.  Sonographer:    Jonelle Sidle Dance Referring Phys: [place]  1. Left ventricular ejection fraction, by estimation, is 60 to 65%. The left ventricle has normal function. The left ventricle has no regional wall motion abnormalities. Left ventricular diastolic parameters were normal.  2. Right ventricular systolic function is normal. The right ventricular size is normal. There is normal pulmonary artery systolic pressure.  3. Left atrial size was mildly dilated.  4. The mitral valve is normal in structure. No evidence of mitral valve regurgitation. No evidence of mitral stenosis.  5. The aortic valve is normal in structure. Aortic valve regurgitation is not visualized. No aortic stenosis is present.  6. The inferior vena cava is normal in size with greater than 50% respiratory variability, suggesting right atrial pressure of 3 mmHg. FINDINGS  Left Ventricle: Left ventricular ejection fraction, by estimation, is 60 to 65%. The left ventricle has normal function. The left ventricle has no regional wall motion abnormalities. Global longitudinal strain performed but not reported based on interpreter judgement due to suboptimal tracking. 3D left ventricular ejection fraction analysis performed but not reported based on interpreter judgement due to suboptimal quality. The left ventricular internal cavity size was normal in size. There is no left ventricular hypertrophy. Left ventricular diastolic parameters were normal. Right Ventricle:  The right ventricular size is normal. No increase in right ventricular wall thickness. Right ventricular systolic function is normal. There is normal pulmonary artery systolic pressure. The tricuspid regurgitant velocity is 2.64 m/s, and  with an assumed right atrial pressure of 8 mmHg, the estimated right ventricular systolic pressure is 53.6 mmHg. Left Atrium: Left atrial size was mildly dilated. Right Atrium: Right atrial size was normal in size. Pericardium: There is no evidence of pericardial effusion. Mitral Valve: The mitral valve is normal in structure. No evidence of mitral valve regurgitation. No evidence of mitral valve stenosis. Tricuspid Valve: The tricuspid valve is normal in structure. Tricuspid valve regurgitation is trivial. No evidence of tricuspid stenosis. Aortic Valve: The aortic valve is normal in structure. Aortic valve regurgitation is not visualized. No aortic stenosis is present. Pulmonic Valve: The pulmonic valve was normal in structure. Pulmonic valve regurgitation is not visualized. No evidence of pulmonic stenosis. Aorta: The aortic root is normal in size and structure. Venous: The inferior vena cava is normal in size with greater than 50% respiratory variability, suggesting right atrial pressure of 3 mmHg. IAS/Shunts: No atrial level shunt detected by color flow Doppler.  LEFT VENTRICLE PLAX 2D LVIDd:         4.81 cm  Diastology LVIDs:         3.31 cm  LV e' medial:    9.57 cm/s LV PW:         0.99 cm  LV E/e' medial:  8.2 LV IVS:        0.88 cm  LV e' lateral:   18.30 cm/s LVOT diam:     2.10 cm  LV E/e' lateral: 4.3 LV SV:         74 LV SV Index:   47 LVOT Area:     3.46 cm  RIGHT VENTRICLE             IVC RV Basal diam:  2.71 cm     IVC diam: 2.15 cm RV S prime:     16.60 cm/s TAPSE (M-mode): 2.3 cm LEFT ATRIUM         Index LA diam:    3.90 cm 2.45 cm/m  AORTIC VALVE LVOT Vmax:   122.00 cm/s LVOT Vmean:  78.000 cm/s LVOT VTI:    0.215 m  AORTA Ao Root diam: 3.60 cm MITRAL VALVE                TRICUSPID VALVE MV Area (PHT): 2.39 cm    TR Peak grad:   27.9 mmHg MV Decel Time: 318 msec    TR Vmax:        264.00 cm/s MV E velocity: 78.57 cm/s MV A velocity: 55.10 cm/s  SHUNTS MV E/A ratio:  1.43        Systemic VTI:  0.22 m                            Systemic Diam: 2.10 cm Jenkins Rouge MD Electronically signed by Jenkins Rouge MD Signature Date/Time: 06/28/2020/4:25:48 PM    Final    DG HIP UNILAT WITH PELVIS 2-3 VIEWS LEFT  Result Date: 06/27/2020 CLINICAL DATA:  Found down.  Agitation. EXAM: DG HIP (WITH OR WITHOUT PELVIS) 2-3V LEFT COMPARISON:  05/01/2020 FINDINGS: There is no evidence of hip fracture or dislocation. Hip joint spaces are preserved. Decreased offset of the bilateral femoral head-neck junctions. No suspicious bony lesion. Bilateral nephroureteral stents remain in place. IMPRESSION: Negative. Electronically Signed   By: Davina Poke D.O.   On: 06/27/2020 13:55     Medications:   .  prismasol BGK 4/2.5 500 mL/hr at 06/29/20 0300  .  prismasol BGK 4/2.5 300 mL/hr at 06/29/20 0718  . sodium chloride    . dexmedetomidine (PRECEDEX) IV infusion 1 mcg/kg/hr (06/29/20 0900)  . feeding supplement (PIVOT 1.5 CAL)    . meropenem (MERREM) IV Stopped (06/29/20 0844)  . norepinephrine (LEVOPHED) Adult infusion Stopped (06/29/20 0303)  . prismasol BGK 4/2.5 1,500 mL/hr at 06/29/20 0721  . vancomycin     . chlorhexidine  15 mL Mouth Rinse BID  . Chlorhexidine Gluconate Cloth  6 each Topical Daily  . heparin  5,000 Units Subcutaneous Q8H  . mouth rinse  15 mL Mouth Rinse q12n4p  . pantoprazole (PROTONIX) IV  40 mg Intravenous QHS  . sodium chloride flush  10-40 mL Intracatheter Q12H   sodium chloride, docusate sodium, fentaNYL (SUBLIMAZE) injection, heparin sodium (porcine), polyethylene glycol, sodium chloride, sodium chloride flush  Assessment/ Plan:   Acute on chronic kidney disease.  Multiple factors leading to acute injury.  Baseline creatinine about 5  mg/dL.  Admitted with altered mental status severe metabolic acidosis.  Initiated on CRRT 06/27/2020 appears to be tolerating well.  Hypokalemia patient now on 4K dialysate appears to be stable  Metabolic acidosis patient is receiving IV bicarbonate as well CRRT.    Chronic kidney disease with obstructive uropathy urology following  Sepsis antibiotics and cultures as per primary service  Anemia transfuse as needed   LOS: 2 Sherril Croon @TODAY @9 :58 AM

## 2020-06-29 NOTE — Progress Notes (Signed)
Patient ID: Chase [name], male   DOB: 1966/02/17, 55 y.o.   MRN: [id number]    Subjective: I was notified of patient's admission by Dr. Gloriann Loan.  I know Chase [name] well from his prior hospitalizations.  He has indwelling bilateral ureteral stents (placed for AKI and hydronephrosis, initially placed about 6-7 months ago) replaced about 2 months ago and an indwelling Foley catheter for urinary retention.  He had a diverticular abscess with colovesical fistula which was noted to have closed spontaneously when studied in January.  He also previously had nephrostomy tubes but these were removed in January after a trial of clamping with no worsening of his renal function.  Cr stabilized at around 4.0-4.5 after optimal drainage.   He presented here with a Cr of 10.1 and has been receiving CRRT with correction of his electrolytes but still persistent delirium.  Objective: Vital signs in last 24 hours: Temp:  [96.08 F (35.6 C)-100.04 F (37.8 C)] 98.6 F (37 C) (03/31 1515) Pulse Rate:  [45-110] 71 (03/31 1515) Resp:  [12-26] 20 (03/31 1515) BP: (81-173)/(45-113) 137/84 (03/31 1515) SpO2:  [88 %-100 %] 100 % (03/31 1515) Weight:  [57.5 kg] 57.5 kg (03/31 0500)  Intake/Output from previous day: 03/30 0701 - 03/31 0700 In: 4188.5 [I.V.:2420; IV Piggyback:1733.5] Out: 956.6 [Urine:854; Stool:100] Intake/Output this shift: Total I/O In: 288.5 [I.V.:128.4; Other:60; IV Piggyback:100.1] Out: 294 [Urine:294]  Physical Exam:  General: Still with delirium and moaning in pain  Lab Results: Recent Labs    06/28/20 0430 06/28/20 0535 06/29/20 0500  HGB 7.8* 7.8* 7.3*  HCT 21.9* 21.9* 20.4*   BMET Recent Labs    06/28/20 1517 06/29/20 0500  NA 135 136  K 3.1* 3.6  CL 100 102  CO2 20* 20*  GLUCOSE 95 92  BUN 48* 29*  CREATININE 3.54* 2.17*  CALCIUM 6.5* 7.0*   Lab Results  Component Value Date   CREATININE 2.17 (H) 06/29/2020   CREATININE 3.54 (H) 06/28/2020   CREATININE 5.48 (H)  06/28/2020     Studies/Results:   Assessment/Plan: 1) Bilateral hydronephrosis:  He has chronic indwelling stents last changed 2 months ago.  I agree with Dr. Gloriann Loan that further intervention is not indicated and we proved during his last admission that nephrostomy tube drainage was not offering any additional advantage to his stents.  The longer term question will be whether he recovers any significant renal function at this time.  He has previously stated that he did not want to consider long term dialysis.  2) Urinary retention: He does have evidence of detrusor function on recent urodynamics and appears to have bladder outlet obstruction most likely from BPH.  Continue urethral catheter for now.   LOS: 2 days   Chase [name] 06/29/2020, 3:32 PM

## 2020-06-29 NOTE — Progress Notes (Signed)
[facility] Progress Note Patient Name: Chase [name] DOB: 14-May-1965 MRN: [id number]   Date of Service  06/29/2020  HPI/Events of Note  On NRB now Not clearing secretions well But screaming & needing restraints with RNs holding him down  eICU Interventions  Spoke to son Chase [name] & explained that best to intubate so we can control his pain/agitation better but prognosis would be poor post intubation. He would like to d/w rest of family WIll give 1 mg versed since this worked best to a llow family to decide      Intervention Category Major Interventions: Delirium, psychosis, severe agitation - evaluation and management;Respiratory failure - evaluation and management  Omya Winfield V. Roniyah Llorens 06/29/2020, 6:54 AM

## 2020-06-30 ENCOUNTER — Inpatient Hospital Stay (HOSPITAL_COMMUNITY): Payer: No Typology Code available for payment source

## 2020-06-30 DIAGNOSIS — N179 Acute kidney failure, unspecified: Secondary | ICD-10-CM | POA: Diagnosis not present

## 2020-06-30 DIAGNOSIS — N185 Chronic kidney disease, stage 5: Secondary | ICD-10-CM | POA: Diagnosis not present

## 2020-06-30 DIAGNOSIS — G9340 Encephalopathy, unspecified: Secondary | ICD-10-CM | POA: Diagnosis not present

## 2020-06-30 LAB — GLUCOSE, CAPILLARY
Glucose-Capillary: 85 mg/dL (ref 70–99)
Glucose-Capillary: 79 mg/dL (ref 70–99)
Glucose-Capillary: 98 mg/dL (ref 70–99)
Glucose-Capillary: 81 mg/dL (ref 70–99)
Glucose-Capillary: 96 mg/dL (ref 70–99)
Glucose-Capillary: 102 mg/dL — ABNORMAL HIGH (ref 70–99)
Glucose-Capillary: 104 mg/dL — ABNORMAL HIGH (ref 70–99)

## 2020-06-30 LAB — CBC
HCT: 23.1 % — ABNORMAL LOW (ref 39.0–52.0)
Hemoglobin: 8 g/dL — ABNORMAL LOW (ref 13.0–17.0)
MCH: 31.3 pg (ref 26.0–34.0)
MCHC: 34.6 g/dL (ref 30.0–36.0)
MCV: 90.2 fL (ref 80.0–100.0)
Platelets: 103 10*3/uL — ABNORMAL LOW (ref 150–400)
RBC: 2.56 MIL/uL — ABNORMAL LOW (ref 4.22–5.81)
RDW: 16.9 % — ABNORMAL HIGH (ref 11.5–15.5)
WBC: 9.3 10*3/uL (ref 4.0–10.5)
nRBC: 0.3 % — ABNORMAL HIGH (ref 0.0–0.2)

## 2020-06-30 LAB — RENAL FUNCTION PANEL
Albumin: 2 g/dL — ABNORMAL LOW (ref 3.5–5.0)
Albumin: 2 g/dL — ABNORMAL LOW (ref 3.5–5.0)
Anion gap: 10 (ref 5–15)
Anion gap: 9 (ref 5–15)
BUN: 16 mg/dL (ref 6–20)
BUN: 11 mg/dL (ref 6–20)
CO2: 21 mmol/L — ABNORMAL LOW (ref 22–32)
CO2: 23 mmol/L (ref 22–32)
Calcium: 7.6 mg/dL — ABNORMAL LOW (ref 8.9–10.3)
Calcium: 7.2 mg/dL — ABNORMAL LOW (ref 8.9–10.3)
Chloride: 108 mmol/L (ref 98–111)
Chloride: 105 mmol/L (ref 98–111)
Creatinine, Ser: 1.42 mg/dL — ABNORMAL HIGH (ref 0.61–1.24)
Creatinine, Ser: 1.33 mg/dL — ABNORMAL HIGH (ref 0.61–1.24)
GFR, Estimated: 58 mL/min — ABNORMAL LOW (ref >60)
GFR, Estimated: >60 mL/min (ref >60)
Glucose, Bld: 89 mg/dL (ref 70–99)
Glucose, Bld: 177 mg/dL — ABNORMAL HIGH (ref 70–99)
Phosphorus: 1.8 mg/dL — ABNORMAL LOW (ref 2.5–4.6)
Phosphorus: 4.4 mg/dL (ref 2.5–4.6)
Potassium: 3.4 mmol/L — ABNORMAL LOW (ref 3.5–5.1)
Potassium: 4.8 mmol/L (ref 3.5–5.1)
Sodium: 139 mmol/L (ref 135–145)
Sodium: 137 mmol/L (ref 135–145)

## 2020-06-30 LAB — HEPATIC FUNCTION PANEL
ALT: 63 U/L — ABNORMAL HIGH (ref 0–44)
AST: 80 U/L — ABNORMAL HIGH (ref 15–41)
Albumin: 2.1 g/dL — ABNORMAL LOW (ref 3.5–5.0)
Alkaline Phosphatase: 108 U/L (ref 38–126)
Bilirubin, Direct: 0.1 mg/dL (ref 0.0–0.2)
Indirect Bilirubin: 1.2 mg/dL — ABNORMAL HIGH (ref 0.3–0.9)
Total Bilirubin: 1.3 mg/dL — ABNORMAL HIGH (ref 0.3–1.2)
Total Protein: 5 g/dL — ABNORMAL LOW (ref 6.5–8.1)

## 2020-06-30 LAB — MAGNESIUM: Magnesium: 2.5 mg/dL — ABNORMAL HIGH (ref 1.7–2.4)

## 2020-06-30 LAB — PROCALCITONIN: Procalcitonin: 4.17 ng/mL

## 2020-06-30 MED ORDER — SODIUM CHLORIDE 0.9 % IV SOLN
1.0000 g | Freq: Two times a day (BID) | INTRAVENOUS | Status: DC
  Administered 2020-07-01 – 2020-07-02 (×3): 1 g via INTRAVENOUS
  Filled 2020-06-30 (×3): qty 1

## 2020-06-30 MED ORDER — LACTATED RINGERS IV BOLUS
500.0000 mL | Freq: Once | INTRAVENOUS | Status: AC
  Administered 2020-06-30: 500 mL via INTRAVENOUS

## 2020-06-30 MED ORDER — MAGNESIUM SULFATE 2 GM/50ML IV SOLN
2.0000 g | Freq: Once | INTRAVENOUS | Status: AC
  Administered 2020-06-30: 2 g via INTRAVENOUS
  Filled 2020-06-30: qty 50

## 2020-06-30 MED ORDER — GABAPENTIN 250 MG/5ML PO SOLN
300.0000 mg | Freq: Three times a day (TID) | ORAL | Status: DC
  Administered 2020-06-30 (×3): 300 mg
  Filled 2020-06-30 (×5): qty 6

## 2020-06-30 MED ORDER — DOCUSATE SODIUM 50 MG/5ML PO LIQD: 100.0000 mg | Freq: Two times a day (BID) | ORAL | Status: DC | PRN

## 2020-06-30 MED ORDER — NOREPINEPHRINE 4 MG/250ML-% IV SOLN: 0.0000 ug/min | INTRAVENOUS | Status: DC

## 2020-06-30 MED ORDER — PIVOT 1.5 CAL PO LIQD
1000.0000 mL | ORAL | Status: DC
  Filled 2020-06-30 (×2): qty 1000

## 2020-06-30 MED ORDER — POTASSIUM PHOSPHATES 15 MMOLE/5ML IV SOLN
20.0000 mmol | Freq: Once | INTRAVENOUS | Status: AC
  Administered 2020-06-30: 20 mmol via INTRAVENOUS
  Filled 2020-06-30: qty 6.67

## 2020-06-30 MED ORDER — POTASSIUM & SODIUM PHOSPHATES 280-160-250 MG PO PACK
2.0000 | PACK | ORAL | Status: AC
  Administered 2020-06-30 (×2): 2
  Filled 2020-06-30 (×2): qty 2

## 2020-06-30 MED ORDER — ACETAMINOPHEN 160 MG/5ML PO SOLN
650.0000 mg | ORAL | Status: DC | PRN
  Filled 2020-06-30 (×2): qty 20.3

## 2020-06-30 NOTE — Progress Notes (Signed)
Pharmacy Antibiotic Note  Chase [name] is a 55 y.o. male admitted on 06/27/2020 with sepsis.  Pharmacy has been consulted for vancomycin & meropenem dosing. Today, vancomycin and CRRT discontinued.   Plan:  Adjust meropenem to 1g IV q12 with CRRT stopping - dosing for baseline renal function (SCr ~5; CrCl ~14)  Vanc stopped by CCM  Height: 5\' 6"  (167.6 cm) Weight: 59 kg (130 lb 1.1 oz) IBW/kg (Calculated) : 63.8  Temp (24hrs), Avg:98.3 F (36.8 C), Min:97.16 F (36.2 C), Max:99.86 F (37.7 C)  Recent Labs  Lab 06/27/20 1238 06/27/20 1830 06/28/20 0430 06/28/20 0535 06/28/20 1517 06/29/20 0500 06/29/20 1550 06/30/20 0430 06/30/20 1615  WBC  --  5.5 10.1 11.4*  --  11.1*  --  9.3  --   CREATININE  --  10.16*  --  5.48* 3.54* 2.17* 1.66* 1.42* 1.33*  LATICACIDVEN 0.9 0.6  --   --   --  0.9  --   --   --     Estimated Creatinine Clearance: 51.8 mL/min (A) (by C-G formula based on SCr of 1.33 mg/dL (H)).    Allergies  Allergen Reactions  . Nsaids Other (See Comments)    Bleeding GI Ulceration  . Ciprofloxacin Nausea And Vomiting    Antimicrobials this admission: 3/29 CTX>>3/30 3/30 vanc>> 4/1 3/30 meropenem>> Dose adjustments this admission: 4/1 meropenem 1g q8 >> q12 Microbiology results: 3/29 flu/covid - 3/29 MRSA - 3/30 Hep A B C all NR 3/29 BCx2: ngtd  Thank you for allowing pharmacy to be a part of this patient's care.  Reuel Boom, PharmD, BCPS [phone] 06/30/2020, 8:01 PM

## 2020-06-30 NOTE — Progress Notes (Signed)
Pharmacy Antibiotic Note  Chase [name] is a 55 y.o. male with hx CKD, loop colostomy and bilateral hydronephrosis (s/p bilateral ureteral stents) presented to the ED on 06/27/2020 with AMS. He was found to be in acute renal failure with severe acidosis.  CRRT started on 3/29 and was started on vancomycin and ceftriaxone for suspected sepsis. Ceftriaxone changed to meropenem on 3/30.  Today, 06/30/2020: - day #3 abx - Tmax 99.5, wbc wnl - PCT elevated but down to 4.17 - all cultures have been negative thus far - remains on CRRT  Plan: - continue meropenem dose of 1gm IV q8h while on CRRT - vancomycin d/ced today per MD - f/u with cultures, clinical status and renal function  _________________________________________  Height: 5\' 6"  (167.6 cm) Weight: 59 kg (130 lb 1.1 oz) IBW/kg (Calculated) : 63.8  Temp (24hrs), Avg:97.9 F (36.6 C), Min:96.08 F (35.6 C), Max:99.5 F (37.5 C)  Recent Labs  Lab 06/27/20 1238 06/27/20 1830 06/28/20 0430 06/28/20 0535 06/28/20 1517 06/29/20 0500 06/29/20 1550 06/30/20 0430  WBC  --  5.5 10.1 11.4*  --  11.1*  --  9.3  CREATININE  --  10.16*  --  5.48* 3.54* 2.17* 1.66* 1.42*  LATICACIDVEN 0.9 0.6  --   --   --  0.9  --   --     Estimated Creatinine Clearance: 48.5 mL/min (A) (by C-G formula based on SCr of 1.42 mg/dL (H)).    Allergies  Allergen Reactions  . Nsaids Other (See Comments)    Bleeding GI Ulceration  . Ciprofloxacin Nausea And Vomiting    Thank you for allowing pharmacy to be a part of this patient's care.  Lynelle Doctor 06/30/2020 10:33 AM

## 2020-06-30 NOTE — Progress Notes (Signed)
[facility] Progress Note Patient Name: Chase [name] DOB: 12/18/1965 MRN: [id number]   Date of Service  06/30/2020  HPI/Events of Note  Nursing request to review abdominal film yet again as it seems that the patient pulled out previous gastric tube. Film review reveals weighted feeding catheter within the stomach.  eICU Interventions  OK to use feeding tube.      Intervention Category Major Interventions: Other:  Lysle Dingwall 06/30/2020, 2:41 AM

## 2020-06-30 NOTE — Progress Notes (Signed)
NUTRITION NOTE  Patient seen for full assessment by this RD on 3/30. He continues on CRRT.   He remains NPO. NGT was able to be placed in L nare overnight. Abdominal xray report indicates tip and side port are in the stomach.   TF to start today per previously placed order: Pivot 1.5 @ 20 ml/hr to advance by 10 ml every 24 hours to reach goal rate of 60 ml/hr. At goal rate, this regimen will provide 2160 kcal, 135 grams protein, and 1093 ml free water.  Free water flush, if desired, to be per CCM/Nephrology. Weight today consistent with admission (3/29) weight. No information documented in the edema section of flow sheet.   Labs reviewed; CBGs: 79, 98, 81 mg/dl, K: 3.4 mmol/l, Phos: 1.8 mg/dl, Mg: 2.5 mg/dl.  RD will continue to follow per protocol.      Chase Matin, MS, RD, LDN, CNSC Inpatient Clinical Dietitian RD pager # available in [place]  After hours/weekend pager # available in [facility]

## 2020-06-30 NOTE — Progress Notes (Signed)
NAME:  Chase [name], MRN:  [id number], DOB:  01/02/1966, LOS: 3 ADMISSION DATE:  06/27/2020, CONSULTATION DATE: 06/27/2020 REFERRING MD: Dr. 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  He was admitted in January/February 2022 with acute on chronic renal failure related to obstructive nephropathy after his nephrostomy tubes were pulled.  He had internal ureteral stents replaced at that time.  A diverting loop colostomy was also performed for the fistula. He is on high-dose Neurontin and narcotics for chronic pain history of PTSD  He was found 3/29 on the floor by his son, unclear downtime.  Last seen well midday 3/28 when he was evaluated by EMS after a fall, question narcotics overdose.  He was not brought to the ED at that time.  His son reported that overall his health has been in steady decline, has difficulty with his ADL.  In the [facility] ED on 3/29 found to have a profound metabolic acidosis with acute on chronic renal failure, elevated CK, relative hypotension, mild transaminitis.  He was started on IV bicarbonate, IV fluid resuscitation, received empiric ceftriaxone.    Pertinent  Medical History   Past Medical History:  Diagnosis Date  . Cervical radiculopathy    with left sided weakness  . Chemical exposure    service related injury  . Chronic pain   . CKD (chronic kidney disease), stage IV ([place]) 10/17/2019  . COVID-19   . GERD (gastroesophageal reflux disease)   . GIB (gastrointestinal bleeding)   . Heart attack ([place])   . History of  CVA (cerebrovascular accident) 12/31/2019  . 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 History of stomach ulcers 05/19/2013   Formatting of this note might be different from the original. Attributed to chronic ibuprofen use  . Hodgkin lymphoma, unspecified, unspecified site ([place]) 12/10/2012   Formatting of this note might be different from the original. In remission.  . Hypokalemia   . Hypomagnesemia   . PTSD (post-traumatic stress disorder)   . Renal disorder    CKD  . Stroke ([place])   . Urinary retention    due to bladder outlet obstruction    Significant Hospital Events: Including procedures, antibiotic start and stop dates in addition to other pertinent events   . UDS 3/29 >> negative . CT abdomen/pelvis 3/29 >> bilateral renal atrophy, bilateral ureteral stents are noted in good position without any hydronephrosis.  Foley catheter present.  History gastric surgery with left lower quadrant colostomy. . Renal ultrasound 3/29 >> stable moderate bilateral hydroureteronephrosis, right ureteral stent extending to the renal pelvis, left ureteral stent faintly visualized.  Echogenic focus in the left kidney question nonobstructing stone or air bubble . CT pelvis/left hip 3/29 >> no evidence of hip fracture or dislocation normal joint space, decreased offset of the bilateral femoral head neck junctions without suspicious bony lesion . Chest x-ray 3/29 >> lungs clear, no infiltrates . Head CT 3/29 >> no acute intracranial abnormality, old right occipital lobe infarct . Blood cultures 3/29 ([facility]) >>  . CVVHD started 3/29 with significant improvement in severe metabolic acidosis . Developed shock 3/29 when CVVH started, on norepinephrine . NG  tube attempted 3/30, unsuccessful . Echocardiogram 3/30 >> LVEF 60-65% without regional wall motion abnormality, no diastolic dysfunction.  Normal RV size and function, normal PASP.  No mention of valvular abnormality  Interim History / Subjective:  Patient's  gabapentin was able to be restarted in the afternoon 3/31 after NG tube successfully placed NG tube pulled overnight but replaced and in good position on imaging, gabapentin continued Has been getting Dilaudid regularly, still with some moaning and calling out Significant improvement mental status since yesterday afternoon as below Back on Precedex and tolerating CVVH running I/O+ 6.2 L total, urine output 1000 cc last 24-hour   Objective   Blood pressure (!) 104/53, pulse (!) 52, temperature (!) 97.52 F (36.4 C), resp. rate 11, height 5\' 6"  (1.676 m), weight 59 kg, SpO2 100 %. CVP:  [2 mmHg-7 mmHg] 4 mmHg      Intake/Output Summary (Last 24 hours) at 06/30/2020 0803 Last data filed at 06/30/2020 0700 Gross per 24 hour  Intake 978.73 ml  Output 1381 ml  Net -402.27 ml   Filed Weights   06/28/20 0414 06/29/20 0500 06/30/20 0451  Weight: 52.8 kg 57.5 kg 59 kg    Examination: General: Cachectic ill-appearing man.  Laying comfortably, more calm HENT: NG tube in place, pupils equal, no upper airway secretions Lungs: Clear, no crackles or wheezes Cardiovascular: Distant, regular, no murmur, 70s Abdomen: Nondistended, nontender, ostomy in place with brown stool, positive bowel sounds Extremities: No edema Neuro: More calm.  Opened his eyes to voice and tracks.  Followed commands.  Answers simple yes and no questions GU: Foley in place   Resolved Hospital Problem list   Severe anion gap metabolic acidosis Mild rhabdomyolysis  Assessment & Plan:   Acute on chronic (stage IV-V) renal failure.  Long history of ureteral obstruction but current CT abdomen did not show hydronephrosis.  Question due to hypovolemia, rhabdomyolysis, question sepsis.  Severe anion gap metabolic acidosis with normal lactate, EtOH negative.  Resolved.  Presumed due to the above.  Volatile alcohol screen picked up acetone, no others. Probable rhabdomyolysis; CK down to 1233 on 3/30 Hypokalemia and  hypomagnesemia on CVVHD -Urology reviewed his CT abdomen and there was no clear role for percutaneous nephrostomy tubes.  Appreciate assistance -Tolerating CVVHD with improved metabolic status.  Urine output is increasing, 1000 cc last 24 hours.  Question whether he will achieve renal recovery.  His baseline renal function was very impaired and we may need to discuss possible long-term dialysis with family.  He is a poor candidate for this given his overall functional capacity -Antibiotics as below -Replace potassium, magnesium, phosphorus again 4/1  Acute respiratory failure.  Principally due to airway protection and secretions.  Chest x-ray 3/31 reassuring -Stabilized as his metabolic status has improved -Balancing his sedation and pain control needs with airway protection, wakefulness and respiratory status -NTS if needed, has not required last 24 hours -Given his overall debilitated state and underlying medical issues he is a poor candidate for mechanical ventilation.  I discussed this with his family and they agree.  Shock, presumed hypovolemic, possibly septic.  Echocardiogram 3/30 was normal.  Pressor needs markedly improved as we have impacted his metabolic status, now off -Following BP, HR with Precedex and narcotics -Stress dose steroids discontinued 3/31 to ensure no contribution to his delirium -Follow CVP daily -Blood cultures from [facility] negative so far.  Antibiotics day 4: Broadened to vancomycin and meropenem on 3/30.  Plan to stop vancomycin 4/1, continue total antibiotics  at least 7 days  Acute toxic metabolic encephalopathy.  Due to his metabolic disarray, renal failure.  Also consider toxic substances.  His UDS is negative.  Volatile alcohols positive acetone, unclear significance.  Suspect also components of narcotics withdrawal, gabapentin withdrawal Chronic pain -Continue CVVHD, metabolic status much improved -Follow airway protection.  Need to balance sedating medication and  narcotics with respiratory status. -He is on narcotics and high-dose Neurontin at home.  Both have now been restarted and mental status is more clear over the last 12 hours -Restart Cymbalta per tube if possible, otherwise will have to wait until he can swallow -Could consider repeat head imaging if he declines  CAD -Home aspirin and Plavix, statin are all on hold.  Plan to restart when stable from acute events  Mild transaminitis.  Question shock liver.  Hepatitis panel negative -Follow intermittent LFT  Diverting colostomy, history of colovesicular fistula -Standard colostomy care  Protein calorie malnutrition -Suspect we can start nutrition 4/1 now that NG tube is in place  GOC/family discussions -Discussed patient status, critical illness, very guarded prognosis with his son [name] and brother Gerald Stabs 3/29 daily.  We are trying to get him through this metabolic illness and also his respiratory decompensation noninvasively.  I recommended that I do not think he would withstand CPR and they agree.  We talked on 3/31 specifically about mechanical ventilation.   given the course, I do not believe that extending his life with MV would allow him to get through the overall illness.  Further not sure that he would be able to be successfully extubated given his acute issues superimposed on chronic debilitated state.   Rosary Lively and other family agree. We have placed DNR orders.   Best practice (right click and "Reselect all SmartList Selections" daily)  Diet:  Tube Feed  Pain/Anxiety/Delirium protocol (if indicated): Yes (RASS goal 0) VAP protocol (if indicated): Not indicated DVT prophylaxis: Subcutaneous Heparin GI prophylaxis: PPI Glucose control:  SSI No Central venous access:  Yes, and it is still needed Arterial line:  N/A Foley:  Yes, and it is still needed Mobility:  bed rest  PT consulted: N/A Last date of multidisciplinary goals of care discussion [06/29/20] Code Status:   DNR Disposition: ICU  Labs   CBC: Recent Labs  Lab 06/27/20 1237 06/27/20 1830 06/28/20 0430 06/28/20 0535 06/29/20 0500 06/30/20 0430  WBC 13.2* 5.5 10.1 11.4* 11.1* 9.3  NEUTROABS 11.7*  --   --   --   --   --   HGB 9.3* 8.4* 7.8* 7.8* 7.3* 8.0*  HCT 28.5* 25.6* 21.9* 21.9* 20.4* 23.1*  MCV 97.6 95.5 88.0 88.3 87.2 90.2  PLT 234 191 172 158 118* 103*    Basic Metabolic Panel: Recent Labs  Lab 06/28/20 0535 06/28/20 1517 06/29/20 0500 06/29/20 1550 06/30/20 0430  NA 135 135 136 137 139  K <2.0* 3.1* 3.6 3.7 3.4*  CL 102 100 102 104 108  CO2 13* 20* 20* 20* 21*  GLUCOSE 245* 95 92 78 89  BUN 62* 48* 29* 20 16  CREATININE 5.48* 3.54* 2.17* 1.66* 1.42*  CALCIUM 6.2* 6.5* 7.0* 7.4* 7.6*  MG 1.8 1.9 2.4  --  2.5*  PHOS 3.5 2.8 2.5 2.4* 1.8*   GFR: Estimated Creatinine Clearance: 48.5 mL/min (A) (by C-G formula based on SCr of 1.42 mg/dL (H)). Recent Labs  Lab 06/27/20 1238 06/27/20 1830 06/28/20 0430 06/28/20 0535 06/28/20 1049 06/29/20 0500 06/30/20 0430  PROCALCITON  --   --   --   --  31.73 12.88 4.17  WBC  --  5.5 10.1 11.4*  --  11.1* 9.3  LATICACIDVEN 0.9 0.6  --   --   --  0.9  --     Liver Function Tests: Recent Labs  Lab 06/27/20 1237 06/27/20 1830 06/28/20 0535 06/28/20 1517 06/29/20 0500 06/29/20 1550 06/30/20 0430  AST 77* 84*  --   --   --   --  80*  ALT 84* 77*  --   --   --   --  63*  ALKPHOS 178* 166*  --   --   --   --  108  BILITOT 1.1 1.1  --   --   --   --  1.3*  PROT 6.6 6.0*  --   --   --   --  5.0*  ALBUMIN 2.9* 2.6* 2.4* 2.6* 2.2* 2.2* 2.1*  2.0*   No results for input(s): LIPASE, AMYLASE in the last 168 hours. No results for input(s): AMMONIA in the last 168 hours.  ABG    Component Value Date/Time   PHART 7.330 (L) 06/28/2020 0504   PCO2ART 25.7 (L) 06/28/2020 0504   PO2ART 102 06/28/2020 0504   HCO3 13.1 (L) 06/28/2020 0504   TCO2 23 04/18/2020 1205   ACIDBASEDEF 11.3 (H) 06/28/2020 0504   O2SAT 97.9  06/28/2020 0504     Coagulation Profile: No results for input(s): INR, PROTIME in the last 168 hours.  Cardiac Enzymes: Recent Labs  Lab 06/27/20 1237 06/28/20 0535  CKTOTAL 2,210* 1,233*    HbA1C: Hgb A1c MFr Bld  Date/Time Value Ref Range Status  10/18/2019 12:13 AM 5.4 4.8 - 5.6 % Final    Comment:    (NOTE) Pre diabetes:          5.7%-6.4%  Diabetes:              >6.4%  Glycemic control for   <7.0% adults with diabetes     CBG: Recent Labs  Lab 06/29/20 2003 06/29/20 2043 06/29/20 2332 06/30/20 0345 06/30/20 0744  GLUCAP 68* 95 71 79 98      Critical care time: 33 minutes     Baltazar Apo, MD, PhD 06/30/2020, 8:03 AM [facility] [phone] or if no answer before 7:00PM call [phone] For any issues after 7:00PM please call eLink [phone]

## 2020-06-30 NOTE — Progress Notes (Signed)
[facility] Progress Note Patient Name: Chase [name] DOB: [birth year] MRN: [id number]   Date of Service  06/30/2020  HPI/Events of Note  Notified of fever Started on meropenem today Blood culture 3/29 NGTD  eICU Interventions  Tylenol prn ordered     Intervention Category Minor Interventions: Routine modifications to care plan (e.g. PRN medications for pain, fever)  Shona Needles Senora Lacson 06/30/2020, 10:59 PM

## 2020-06-30 NOTE — Progress Notes (Signed)
[facility] ROUNDING NOTE   Subjective:   Brief history.  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  He also has a remote history of Hodgkin's lymphoma. He had acute on chronic renal dysfunction with severe metabolic acidosis and altered mental status.  He was started on CRRT therapy 06/27/2020.  Blood pressure 77/48 pulse 62 temperature 97 O2 sats 99% room air  IV norepinephrine IV sodium bicarbonate  Sodium 139 potassium 3.4 chloride 108 CO2 21 BUN 16 creatinine 1.4 glucose 89 calcium 7.6 phosphorus 1.8 albumin 2 hemoglobin 8    Objective:  Vital signs in last 24 hours:  Temp:  [96.26 F (35.7 C)-99.5 F (37.5 C)] 97.7 F (36.5 C) (04/01 1000) Pulse Rate:  [45-101] 57 (04/01 1000) Resp:  [1-25] 17 (04/01 1000) BP: (99-173)/(51-93) 130/93 (04/01 1000) SpO2:  [89 %-100 %] 98 % (04/01 1000) Weight:  [59 kg] 59 kg (04/01 0451)  Weight change: 1.5 kg Filed Weights   06/28/20 0414 06/29/20 0500 06/30/20 0451  Weight: 52.8 kg 57.5 kg 59 kg    Intake/Output: I/O last 3 completed shifts: In: 1622.3 [I.V.:698.1; Other:178; NG/GT:206.3; IV Piggyback:539.9] Out: 2089.9 [Urine:1593; Other:1.9; Stool:495]   Intake/Output this shift:  Total I/O In: 316.5 [I.V.:27.1; NG/GT:155; IV Piggyback:134.3] Out: 165 [Urine:165]  Constitutional: Chronically ill-appearing, lying in bed, no distress ENMT: ears and nose without scars or lesions, MMM CV: normal rate, no edema Respiratory: clear to auscultation, normal work of breathing Gastrointestinal: soft, non-tender, no palpable masses or hernias Skin: no visible lesions or rashes Psych: Does not answer questions, lethargic  Basic Metabolic Panel: Recent Labs  Lab 06/28/20 0535 06/28/20 1517  06/29/20 0500 06/29/20 1550 06/30/20 0430  NA 135 135 136 137 139  K <2.0* 3.1* 3.6 3.7 3.4*  CL 102 100 102 104 108  CO2 13* 20* 20* 20* 21*  GLUCOSE 245* 95 92 78 89  BUN 62* 48* 29* 20 16  CREATININE 5.48* 3.54* 2.17* 1.66* 1.42*  CALCIUM 6.2* 6.5* 7.0* 7.4* 7.6*  MG 1.8 1.9 2.4  --  2.5*  PHOS 3.5 2.8 2.5 2.4* 1.8*    Liver Function Tests: Recent Labs  Lab 06/27/20 1237 06/27/20 1830 06/28/20 0535 06/28/20 1517 06/29/20 0500 06/29/20 1550 06/30/20 0430  AST 77* 84*  --   --   --   --  80*  ALT 84* 77*  --   --   --   --  63*  ALKPHOS 178* 166*  --   --   --   --  108  BILITOT 1.1 1.1  --   --   --   --  1.3*  PROT 6.6 6.0*  --   --   --   --  5.0*  ALBUMIN 2.9* 2.6* 2.4* 2.6* 2.2* 2.2* 2.1*  2.0*   No results for input(s): LIPASE, AMYLASE in the last 168 hours. No results for input(s): AMMONIA in the last 168 hours.  CBC: Recent Labs  Lab 06/27/20 1237 06/27/20 1830 06/28/20 0430 06/28/20 0535 06/29/20 0500 06/30/20 0430  WBC 13.2* 5.5 10.1 11.4* 11.1* 9.3  NEUTROABS 11.7*  --   --   --   --   --   HGB 9.3* 8.4* 7.8* 7.8* 7.3* 8.0*  HCT 28.5* 25.6* 21.9* 21.9* 20.4* 23.1*  MCV  97.6 95.5 88.0 88.3 87.2 90.2  PLT 234 191 172 158 118* 103*    Cardiac Enzymes: Recent Labs  Lab 06/27/20 1237 06/28/20 0535  CKTOTAL 2,210* 1,233*    BNP: Invalid input(s): POCBNP  CBG: Recent Labs  Lab 06/29/20 2003 06/29/20 2043 06/29/20 2332 06/30/20 0345 06/30/20 0744  GLUCAP 68* 95 71 79 15    Microbiology: Results for orders placed or performed during the hospital encounter of 06/27/20  Culture, blood (routine x 2)     Status: None (Preliminary result)   Collection Time: 06/27/20 12:54 PM   Specimen: BLOOD  Result Value Ref Range Status   Specimen Description BLOOD BLOOD RIGHT ARM  Final   Special Requests   Final    Blood Culture adequate volume BOTTLES DRAWN AEROBIC AND ANAEROBIC   Culture   Final    NO GROWTH 3 DAYS Performed at [facility], [address] [place], [place] [address]    Report Status PENDING  Incomplete  Culture, blood (routine x 2)     Status: None (Preliminary result)   Collection Time: 06/27/20 12:54 PM   Specimen: BLOOD  Result Value Ref Range Status   Specimen Description BLOOD BLOOD LEFT ARM  Final   Special Requests   Final    Blood Culture adequate volume BOTTLES DRAWN AEROBIC AND ANAEROBIC   Culture   Final    NO GROWTH 3 DAYS Performed at [facility], [address] [place], [place] [address]    Report Status PENDING  Incomplete  Resp Panel by RT-PCR (Flu A&B, Covid) Nasopharyngeal Swab     Status: None   Collection Time: 06/27/20  1:25 PM   Specimen: Nasopharyngeal Swab; Nasopharyngeal(NP) swabs in vial transport medium  Result Value Ref Range Status   SARS Coronavirus 2 by RT PCR NEGATIVE NEGATIVE Final    Comment: (NOTE) SARS-CoV-2 target nucleic acids are NOT DETECTED.  The SARS-CoV-2 RNA is generally detectable in upper respiratory specimens during the acute phase of infection. The lowest concentration of SARS-CoV-2 viral copies this assay can detect is 138 copies/mL. A negative result does not preclude SARS-Cov-2 infection and should not be used as the sole basis for treatment or other patient management decisions. A negative result may occur with  improper specimen collection/handling, submission of specimen other than nasopharyngeal swab, presence of viral mutation(s) within the areas targeted by this assay, and inadequate number of viral copies(<138 copies/mL). A negative result must be combined with clinical observations, patient history, and epidemiological information. The expected result is Negative.  Fact Sheet for Patients:  EntrepreneurPulse.com.au  Fact Sheet for Healthcare Providers:  IncredibleEmployment.be  This test is no t yet approved or cleared by the [place] FDA and  has been authorized for detection and/or diagnosis  of SARS-CoV-2 by FDA under an Emergency Use Authorization (EUA). This EUA will remain  in effect (meaning this test can be used) for the duration of the COVID-19 declaration under Section 564(b)(1) of the Act, 21 U.S.C.section 360bbb-3(b)(1), unless the authorization is terminated  or revoked sooner.       Influenza A by PCR NEGATIVE NEGATIVE Final   Influenza B by PCR NEGATIVE NEGATIVE Final    Comment: (NOTE) The Xpert Xpress SARS-CoV-2/FLU/RSV plus assay is intended as an aid in the diagnosis of influenza from Nasopharyngeal swab specimens and should not be used as a sole basis for treatment. Nasal washings and aspirates are unacceptable for Xpert Xpress SARS-CoV-2/FLU/RSV testing.  Fact Sheet for Patients: EntrepreneurPulse.com.au  Fact  Sheet for Healthcare Providers: IncredibleEmployment.be  This test is not yet approved or cleared by the [place] and has been authorized for detection and/or diagnosis of SARS-CoV-2 by FDA under an Emergency Use Authorization (EUA). This EUA will remain in effect (meaning this test can be used) for the duration of the COVID-19 declaration under Section 564(b)(1) of the Act, 21 U.S.C. section 360bbb-3(b)(1), unless the authorization is terminated or revoked.  Performed at [facility], [address], [place], [place] [address]   MRSA PCR Screening     Status: None   Collection Time: 06/27/20  6:00 PM   Specimen: Nasal Mucosa; Nasopharyngeal  Result Value Ref Range Status   MRSA by PCR NEGATIVE NEGATIVE Final    Comment:        The GeneXpert MRSA Assay (FDA approved for NASAL specimens only), is one component of a comprehensive MRSA colonization surveillance program. It is not intended to diagnose MRSA infection nor to guide or monitor treatment for MRSA infections. Performed at [facility], [place] [address] [place], [place] [address]     Coagulation Studies: No  results for input(s): LABPROT, INR in the last 72 hours.  Urinalysis: Recent Labs    06/27/20 1544  COLORURINE YELLOW  LABSPEC 1.014  PHURINE 6.0  GLUCOSEU NEGATIVE  HGBUR MODERATE*  BILIRUBINUR NEGATIVE  KETONESUR 5*  PROTEINUR 100*  NITRITE NEGATIVE  LEUKOCYTESUR MODERATE*      Imaging: DG Abd 1 View  Result Date: 06/30/2020 CLINICAL DATA:  NG tube placement EXAM: ABDOMEN - 1 VIEW COMPARISON:  06/29/2020 FINDINGS: Scattered large and small bowel gas is noted. Weighted feeding catheter is seen within the stomach. Bilateral ureteral stents are noted. IMPRESSION: Weighted feeding catheter within the stomach. Electronically Signed   By: Inez Catalina M.D.   On: 06/30/2020 01:43   DG Abd 1 View  Result Date: 06/29/2020 CLINICAL DATA:  OG tube placement. EXAM: ABDOMEN - 1 VIEW COMPARISON:  CT 06/27/2020, 2 days ago. FINDINGS: Weighted enteric tube in place, the tube is looped in the stomach, with the tip in the region of the gastric cardia. Bilateral ureteral stents in place. Nonobstructive bowel gas pattern. IMPRESSION: Weighted enteric tube in place, tube looped in the stomach, tip in the region of the gastric cardia. Electronically Signed   By: Keith Rake M.D.   On: 06/29/2020 17:02   DG CHEST PORT 1 VIEW  Result Date: 06/29/2020 CLINICAL DATA:  Respiratory crackles EXAM: PORTABLE CHEST 1 VIEW COMPARISON:  06/27/2020 FINDINGS: Unchanged right upper lobe opacities. Position of the right IJ central venous catheter tip remains in the mid SVC. Cardiomediastinal contours are normal. No pleural effusion or pneumothorax. IMPRESSION: Unchanged right upper lobe opacities. Electronically Signed   By: Ulyses Jarred M.D.   On: 06/29/2020 03:34   DG Abd Portable 1V  Result Date: 06/29/2020 CLINICAL DATA:  Feeding tube placement EXAM: PORTABLE ABDOMEN - 1 VIEW COMPARISON:  06/29/2020 at 4:01 p.m. FINDINGS: Tip of weighted enteric tube projects over the lower medial abdomen, likely within the  distal aspect of a J-shaped stomach. There are bilateral nephroureteral stents. IMPRESSION: Tip of weighted enteric tube projects over the lower medial abdomen, likely within a J-shaped stomach. Electronically Signed   By: Ulyses Jarred M.D.   On: 06/29/2020 23:11   ECHOCARDIOGRAM COMPLETE  Result Date: 06/28/2020    ECHOCARDIOGRAM REPORT   Patient Name:   SHERRY ROGUS Date of Exam: 06/28/2020 Medical Rec #:  [id number]  Height:       66.0 in Accession #:    [id number]      Weight:       116.4 lb Date of Birth:  26-Jul-1965       BSA:          1.589 m Patient Age:    50 years        BP:           144/71 mmHg Patient Gender: M               HR:           63 bpm. Exam Location:  Inpatient Procedure: 2D Echo, Cardiac Doppler and Color Doppler Indications:    Dyspnea R06.00  History:        Patient has prior history of Echocardiogram examinations, most                 recent 10/19/2019. Previous Myocardial Infarction; Stroke.                 COVID-19. CKD. GERD. Cancer.  Sonographer:    Jonelle Sidle Dance Referring Phys: Shannon  1. Left ventricular ejection fraction, by estimation, is 60 to 65%. The left ventricle has normal function. The left ventricle has no regional wall motion abnormalities. Left ventricular diastolic parameters were normal.  2. Right ventricular systolic function is normal. The right ventricular size is normal. There is normal pulmonary artery systolic pressure.  3. Left atrial size was mildly dilated.  4. The mitral valve is normal in structure. No evidence of mitral valve regurgitation. No evidence of mitral stenosis.  5. The aortic valve is normal in structure. Aortic valve regurgitation is not visualized. No aortic stenosis is present.  6. The inferior vena cava is normal in size with greater than 50% respiratory variability, suggesting right atrial pressure of 3 mmHg. FINDINGS  Left Ventricle: Left ventricular ejection fraction, by estimation, is 60 to 65%. The left  ventricle has normal function. The left ventricle has no regional wall motion abnormalities. Global longitudinal strain performed but not reported based on interpreter judgement due to suboptimal tracking. 3D left ventricular ejection fraction analysis performed but not reported based on interpreter judgement due to suboptimal quality. The left ventricular internal cavity size was normal in size. There is no left ventricular hypertrophy. Left ventricular diastolic parameters were normal. Right Ventricle: The right ventricular size is normal. No increase in right ventricular wall thickness. Right ventricular systolic function is normal. There is normal pulmonary artery systolic pressure. The tricuspid regurgitant velocity is 2.64 m/s, and  with an assumed right atrial pressure of 8 mmHg, the estimated right ventricular systolic pressure is 48.8 mmHg. Left Atrium: Left atrial size was mildly dilated. Right Atrium: Right atrial size was normal in size. Pericardium: There is no evidence of pericardial effusion. Mitral Valve: The mitral valve is normal in structure. No evidence of mitral valve regurgitation. No evidence of mitral valve stenosis. Tricuspid Valve: The tricuspid valve is normal in structure. Tricuspid valve regurgitation is trivial. No evidence of tricuspid stenosis. Aortic Valve: The aortic valve is normal in structure. Aortic valve regurgitation is not visualized. No aortic stenosis is present. Pulmonic Valve: The pulmonic valve was normal in structure. Pulmonic valve regurgitation is not visualized. No evidence of pulmonic stenosis. Aorta: The aortic root is normal in size and structure. Venous: The inferior vena cava is normal in size with greater than 50% respiratory variability, suggesting right atrial pressure of  3 mmHg. IAS/Shunts: No atrial level shunt detected by color flow Doppler.  LEFT VENTRICLE PLAX 2D LVIDd:         4.81 cm  Diastology LVIDs:         3.31 cm  LV e' medial:    9.57 cm/s LV PW:          0.99 cm  LV E/e' medial:  8.2 LV IVS:        0.88 cm  LV e' lateral:   18.30 cm/s LVOT diam:     2.10 cm  LV E/e' lateral: 4.3 LV SV:         74 LV SV Index:   47 LVOT Area:     3.46 cm  RIGHT VENTRICLE             IVC RV Basal diam:  2.71 cm     IVC diam: 2.15 cm RV S prime:     16.60 cm/s TAPSE (M-mode): 2.3 cm LEFT ATRIUM         Index LA diam:    3.90 cm 2.45 cm/m  AORTIC VALVE LVOT Vmax:   122.00 cm/s LVOT Vmean:  78.000 cm/s LVOT VTI:    0.215 m  AORTA Ao Root diam: 3.60 cm MITRAL VALVE               TRICUSPID VALVE MV Area (PHT): 2.39 cm    TR Peak grad:   27.9 mmHg MV Decel Time: 318 msec    TR Vmax:        264.00 cm/s MV E velocity: 78.57 cm/s MV A velocity: 55.10 cm/s  SHUNTS MV E/A ratio:  1.43        Systemic VTI:  0.22 m                            Systemic Diam: 2.10 cm Jenkins Rouge MD Electronically signed by Jenkins Rouge MD Signature Date/Time: 06/28/2020/4:25:48 PM    Final      Medications:   .  prismasol BGK 4/2.5 500 mL/hr at 06/30/20 1050  .  prismasol BGK 4/2.5 300 mL/hr at 06/30/20 0012  . sodium chloride    . dexmedetomidine (PRECEDEX) IV infusion 0.4 mcg/kg/hr (06/30/20 1100)  . feeding supplement (PIVOT 1.5 CAL) 20 mL/hr at 06/30/20 0339  . meropenem (MERREM) IV Stopped (06/30/20 5009)  . norepinephrine (LEVOPHED) Adult infusion Stopped (06/30/20 1140)  . prismasol BGK 4/2.5 1,500 mL/hr at 06/30/20 1050   . chlorhexidine  15 mL Mouth Rinse BID  . Chlorhexidine Gluconate Cloth  6 each Topical Daily  . fentaNYL  1 patch Transdermal Q72H  . gabapentin  300 mg Per Tube Q8H  . heparin  5,000 Units Subcutaneous Q8H  . mouth rinse  15 mL Mouth Rinse q12n4p  . pantoprazole (PROTONIX) IV  40 mg Intravenous QHS  . potassium & sodium phosphates  2 packet Per Tube Q4H  . sodium chloride flush  10-40 mL Intracatheter Q12H   sodium chloride, docusate, heparin sodium (porcine), HYDROmorphone (DILAUDID) injection, polyethylene glycol, sodium chloride, sodium chloride  flush  Assessment/ Plan:   Acute on chronic kidney disease.  Multiple factors leading to acute injury.  Baseline creatinine about 5 mg/dL.  Admitted with altered mental status severe metabolic acidosis.  Initiated on CRRT 06/27/2020 appears to be tolerating well.  Hypokalemia patient now on 4K dialysate appears to be stable  Metabolic acidosis patient is receiving IV bicarbonate as well CRRT.  Chronic kidney disease with obstructive uropathy urology following  Sepsis antibiotics and cultures as per primary service  Anemia transfuse as needed  Hypophosphatemia will replete   LOS: [place] @TODAY @11 :56 AM

## 2020-06-30 NOTE — Progress Notes (Signed)
[facility] Progress Note Patient Name: Chase [name] DOB: 08/05/1965 MRN: [id number]   Date of Service  06/30/2020  HPI/Events of Note  Agitation - Patient swatting at nursing staff. Nursing request for bilateral soft wrist restraints.   eICU Interventions  Will order bilateral soft wrist restraints X 9 hours.      Intervention Category Major Interventions: Delirium, psychosis, severe agitation - evaluation and management  [name] 06/30/2020, 1:27 AM

## 2020-07-01 ENCOUNTER — Inpatient Hospital Stay (HOSPITAL_COMMUNITY): Payer: No Typology Code available for payment source

## 2020-07-01 DIAGNOSIS — R410 Disorientation, unspecified: Secondary | ICD-10-CM | POA: Diagnosis not present

## 2020-07-01 DIAGNOSIS — N189 Chronic kidney disease, unspecified: Secondary | ICD-10-CM

## 2020-07-01 DIAGNOSIS — R579 Shock, unspecified: Secondary | ICD-10-CM | POA: Diagnosis not present

## 2020-07-01 DIAGNOSIS — N179 Acute kidney failure, unspecified: Secondary | ICD-10-CM | POA: Diagnosis not present

## 2020-07-01 LAB — RENAL FUNCTION PANEL
Albumin: 1.9 g/dL — ABNORMAL LOW (ref 3.5–5.0)
Albumin: 1.8 g/dL — ABNORMAL LOW (ref 3.5–5.0)
Anion gap: 9 (ref 5–15)
Anion gap: 6 (ref 5–15)
BUN: 14 mg/dL (ref 6–20)
BUN: 17 mg/dL (ref 6–20)
CO2: 23 mmol/L (ref 22–32)
CO2: 25 mmol/L (ref 22–32)
Calcium: 7.3 mg/dL — ABNORMAL LOW (ref 8.9–10.3)
Calcium: 7.1 mg/dL — ABNORMAL LOW (ref 8.9–10.3)
Chloride: 108 mmol/L (ref 98–111)
Chloride: 107 mmol/L (ref 98–111)
Creatinine, Ser: 2.01 mg/dL — ABNORMAL HIGH (ref 0.61–1.24)
Creatinine, Ser: 2.53 mg/dL — ABNORMAL HIGH (ref 0.61–1.24)
GFR, Estimated: 38 mL/min — ABNORMAL LOW (ref >60)
GFR, Estimated: 29 mL/min — ABNORMAL LOW (ref >60)
Glucose, Bld: 91 mg/dL (ref 70–99)
Glucose, Bld: 110 mg/dL — ABNORMAL HIGH (ref 70–99)
Phosphorus: 2.9 mg/dL (ref 2.5–4.6)
Phosphorus: 2.7 mg/dL (ref 2.5–4.6)
Potassium: 3.3 mmol/L — ABNORMAL LOW (ref 3.5–5.1)
Potassium: 4 mmol/L (ref 3.5–5.1)
Sodium: 140 mmol/L (ref 135–145)
Sodium: 138 mmol/L (ref 135–145)

## 2020-07-01 LAB — PHOSPHORUS: Phosphorus: 2.9 mg/dL (ref 2.5–4.6)

## 2020-07-01 LAB — CBC
HCT: 23.1 % — ABNORMAL LOW (ref 39.0–52.0)
Hemoglobin: 7.5 g/dL — ABNORMAL LOW (ref 13.0–17.0)
MCH: 30.5 pg (ref 26.0–34.0)
MCHC: 32.5 g/dL (ref 30.0–36.0)
MCV: 93.9 fL (ref 80.0–100.0)
Platelets: 80 10*3/uL — ABNORMAL LOW (ref 150–400)
RBC: 2.46 MIL/uL — ABNORMAL LOW (ref 4.22–5.81)
RDW: 17.2 % — ABNORMAL HIGH (ref 11.5–15.5)
WBC: 7.9 10*3/uL (ref 4.0–10.5)
nRBC: 0.3 % — ABNORMAL HIGH (ref 0.0–0.2)

## 2020-07-01 LAB — GLUCOSE, CAPILLARY
Glucose-Capillary: 92 mg/dL (ref 70–99)
Glucose-Capillary: 79 mg/dL (ref 70–99)
Glucose-Capillary: 115 mg/dL — ABNORMAL HIGH (ref 70–99)
Glucose-Capillary: 100 mg/dL — ABNORMAL HIGH (ref 70–99)

## 2020-07-01 LAB — MAGNESIUM: Magnesium: 2.5 mg/dL — ABNORMAL HIGH (ref 1.7–2.4)

## 2020-07-01 MED ORDER — GABAPENTIN 600 MG PO TABS
300.0000 mg | ORAL_TABLET | Freq: Three times a day (TID) | ORAL | Status: DC
  Filled 2020-07-01: qty 0.5

## 2020-07-01 MED ORDER — DEXTROSE IN LACTATED RINGERS 5 % IV SOLN: INTRAVENOUS | Status: DC

## 2020-07-01 MED ORDER — ACETAMINOPHEN 650 MG RE SUPP
650.0000 mg | RECTAL | Status: DC | PRN
  Administered 2020-07-01: 650 mg via RECTAL
  Filled 2020-07-01: qty 1

## 2020-07-01 MED ORDER — POTASSIUM CHLORIDE 10 MEQ/100ML IV SOLN
10.0000 meq | INTRAVENOUS | Status: AC
  Administered 2020-07-01 (×4): 10 meq via INTRAVENOUS
  Filled 2020-07-01 (×4): qty 100

## 2020-07-01 MED ORDER — LIP MEDEX EX OINT
TOPICAL_OINTMENT | CUTANEOUS | Status: AC
  Filled 2020-07-01: qty 7

## 2020-07-01 MED ORDER — PANTOPRAZOLE SODIUM 40 MG PO TBEC
40.0000 mg | DELAYED_RELEASE_TABLET | Freq: Every day | ORAL | Status: DC
  Administered 2020-07-01 – 2020-07-07 (×6): 40 mg via ORAL
  Filled 2020-07-01 (×6): qty 1

## 2020-07-01 MED ORDER — POTASSIUM CHLORIDE 10 MEQ/100ML IV SOLN
10.0000 meq | INTRAVENOUS | Status: AC
  Administered 2020-07-01 (×3): 10 meq via INTRAVENOUS
  Filled 2020-07-01 (×3): qty 100

## 2020-07-01 MED ORDER — GABAPENTIN 300 MG PO CAPS
300.0000 mg | ORAL_CAPSULE | Freq: Three times a day (TID) | ORAL | Status: DC
  Administered 2020-07-01 – 2020-07-07 (×19): 300 mg via ORAL
  Filled 2020-07-01 (×19): qty 1

## 2020-07-01 NOTE — Progress Notes (Signed)
[facility] Progress Note Patient Name: Chase [name] DOB: June 04, 1965 MRN: [id number]   Date of Service  07/01/2020  HPI/Events of Note  Feeding tube clogged unable tot give Tylenol Patient has colostomy for colovesicular fistula  eICU Interventions  Tylenol suppository ordered prn      Intervention Category Intermediate Interventions: Other:  Judd Lien 07/01/2020, 12:17 AM

## 2020-07-01 NOTE — Progress Notes (Signed)
At 23:20 found NGT/ [place] was clogged and unable to administer medication. Asked for KUB ordered. Unable to advance the NGT and same problems encounter from previous night,  Elink MD ordered to removed NGT and have MD reevaluate in the morning.

## 2020-07-01 NOTE — Progress Notes (Signed)
eLink Physician-Brief Progress Note Patient Name: Chase [name] DOB: 01/17/1966 MRN: [id number]   Date of Service  07/01/2020  HPI/Events of Note  K 3.3, creatinine 2.01 Mg 2.5 No PO access  eICU Interventions  Ordered potassium 10 meqs IV x 4 doses     Intervention Category Major Interventions: Electrolyte abnormality - evaluation and management  Judd Lien 07/01/2020, 6:31 AM

## 2020-07-01 NOTE — Progress Notes (Signed)
NAME:  Chase [name], MRN:  [id number], DOB:  [birth year], LOS: 4 ADMISSION DATE:  06/27/2020, CONSULTATION DATE: 06/27/2020 REFERRING MD: Dr. 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  He was admitted in January/February 2022 with acute on chronic renal failure related to obstructive nephropathy after his nephrostomy tubes were pulled.  He had internal ureteral stents replaced at that time.  A diverting loop colostomy was also performed for the fistula. He is on high-dose Neurontin and narcotics for chronic pain history of PTSD  He was found 3/29 on the floor by his son, unclear downtime.  Last seen well midday 3/28 when he was evaluated by EMS after a fall, question narcotics overdose.  He was not brought to the ED at that time.  His son reported that overall his health has been in steady decline, has difficulty with ADLs.  In the [facility] ED on 3/29 found to have a profound metabolic acidosis with acute on chronic renal failure, elevated CK, relative hypotension, mild transaminitis.  He was started on IV bicarbonate, IV fluid resuscitation, received empiric ceftriaxone.    Pertinent  Medical History   Past Medical History:  Diagnosis Date  . Cervical radiculopathy    with left sided weakness  . Chemical exposure    service related injury  . Chronic pain   . CKD (chronic kidney disease), stage IV ([place]) 10/17/2019  . COVID-19   . GERD (gastroesophageal reflux disease)   . GIB (gastrointestinal bleeding)   . Heart attack ([place])   . History of  CVA (cerebrovascular accident) 12/31/2019  . 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 History of stomach ulcers 05/19/2013   Formatting of this note might be different from the original. Attributed to chronic ibuprofen use  . Hodgkin lymphoma, unspecified, unspecified site ([place]) 12/10/2012   Formatting of this note might be different from the original. In remission.  . Hypokalemia   . Hypomagnesemia   . PTSD (post-traumatic stress disorder)   . Renal disorder    CKD  . Stroke ([place])   . Urinary retention    due to bladder outlet obstruction    Significant Hospital Events: Including procedures, antibiotic start and stop dates in addition to other pertinent events   . UDS 3/29 >> negative . CT abdomen/pelvis 3/29 >> bilateral renal atrophy, bilateral ureteral stents are noted in good position without any hydronephrosis.  Foley catheter present.  History gastric surgery with left lower quadrant colostomy. . Renal ultrasound 3/29 >> stable moderate bilateral hydroureteronephrosis, right ureteral stent extending to the renal pelvis, left ureteral stent faintly visualized.  Echogenic focus in the left kidney question nonobstructing stone or air bubble . CT pelvis/left hip 3/29 >> no evidence of hip fracture or dislocation normal joint space, decreased offset of the bilateral femoral head neck junctions without suspicious bony lesion . Chest x-ray 3/29 >> lungs clear, no infiltrates . Head CT 3/29 >> no acute intracranial abnormality, old right occipital lobe infarct . Blood cultures 3/29 ([facility]) >> neg  . CVVHD started 3/29 with significant improvement in severe metabolic acidosis . Developed shock 3/29 when CVVH started, on norepinephrine .  NG tube attempted 3/30, unsuccessful . Echocardiogram 3/30 >> LVEF 60-65% without regional wall motion abnormality, no diastolic dysfunction.  Normal RV size and function, normal PASP.  No mention of valvular abnormality . 4/1 d/c'd cvvh   Scheduled Meds: .  chlorhexidine  15 mL Mouth Rinse BID  . Chlorhexidine Gluconate Cloth  6 each Topical Daily  . fentaNYL  1 patch Transdermal Q72H  . gabapentin  300 mg Per Tube Q8H  . heparin  5,000 Units Subcutaneous Q8H  . mouth rinse  15 mL Mouth Rinse q12n4p  . pantoprazole (PROTONIX) IV  40 mg Intravenous QHS  . sodium chloride flush  10-40 mL Intracatheter Q12H   Continuous Infusions: . sodium chloride    . dexmedetomidine (PRECEDEX) IV infusion 0.6 mcg/kg/hr (07/01/20 0803)  . feeding supplement (PIVOT 1.5 CAL) Stopped (06/30/20 2320)  . meropenem (MERREM) IV Stopped (07/01/20 0532)  . norepinephrine (LEVOPHED) Adult infusion Stopped (06/30/20 1140)  . potassium chloride     PRN Meds:.sodium chloride, acetaminophen, docusate, HYDROmorphone (DILAUDID) injection, polyethylene glycol, sodium chloride flush   Interim History / Subjective:   FT clogged/ removed overnight  Off pressors with very low cvp but bp and uop ok    Objective   Blood pressure 133/80, pulse 68, temperature 98.6 F (37 C), resp. rate 18, height 5\' 6"  (1.676 m), weight 60.6 kg, SpO2 99 %. CVP:  [1 mmHg] 1 mmHg      Intake/Output Summary (Last 24 hours) at 07/01/2020 3875 Last data filed at 07/01/2020 6433 Gross per 24 hour  Intake 1918.56 ml  Output 1160 ml  Net 758.56 ml   Filed Weights   06/29/20 0500 06/30/20 0451 07/01/20 0435  Weight: 57.5 kg 59 kg 60.6 kg   CVP:  [1 mmHg] 1 mmHg   Examination: Tmax 100.9   /  Sats 96%  RA  Pt alert, approp nad @ 30 degrees hob  No jvd Oropharynx clear,  Mucosa dry  Neck supple Lungs clear  bilaterally RRR no s3 or or sign murmur Abd soft with nl  excursion  Extr in prevalon boots/ pas in place  Neuro   Knows [place] "[place]" but not date or circumstances, very restless if not on precedex per nursing report     Resolved Hospital Problem list   Severe anion gap metabolic acidosis Mild rhabdomyolysis  Assessment & Plan:   Acute on chronic (stage  IV-V) renal failure.  Long history of ureteral obstruction but current CT abdomen did not show hydronephrosis.  Question due to hypovolemia, rhabdomyolysis, question sepsis.  Severe anion gap metabolic acidosis with normal lactate, EtOH negative.  Resolved.  Presumed due to the above.  Volatile alcohol screen picked up acetone, no others. Probable rhabdomyolysis; CK down to 1233 on 3/30 Hypokalemia and hypomagnesemia on CVVHD -Urology reviewed his CT abdomen and there was no clear role for percutaneous nephrostomy tubes.   -Tolerating CVVHD with improved metabolic status.  Urine output is increasing, 1000 cc last 24 hours.  Question whether he will achieve renal recovery.  His baseline renal function was very impaired and we may need to discuss possible long-term dialysis with family.  He is a poor candidate for this given his overall functional capacity -Antibiotics as below    Acute respiratory failure.  Principally due to airway protection and secretions.    -resolved  as his metabolic status  improved -Balancing his sedation and pain control needs with airway protection, wakefulness and respiratory status - NTS if  needed  - Now on RA/ NCB > start back with sips / chips and advance as tol    Shock, presumed hypovolemic, possibly septic.  Echocardiogram 3/30 was normal.  -Following BP, HR with Precedex and narcotics -Stress dose steroids discontinued 3/31    CVP quite low 4/2 -Blood cultures from [facility] negative so far.  Antibiotics day 5: Broadened to vancomycin and meropenem on 3/30.  Stopped vancomycin 4/1, continue total antibiotics at least 7 days  Acute toxic metabolic encephalopathy.  Due to his metabolic disarray, renal failure.    UDS is negative.  Volatile alcohols positive acetone, unclear significance.  Suspect also components of narcotics withdrawal, gabapentin withdrawal >>> precedex titation off  Chronic pain  -  Plan to Restart Cymbalta per tube if possible, otherwise will have  to wait until he can swallow    CAD -Home aspirin and Plavix, statin are all on hold.  Plan to restart when stable from acute events  Mild transaminitis.  Question shock liver.  Hepatitis panel negative -Follow intermittent LFT  Diverting colostomy, history of colovesicular fistula -Standard colostomy care  Protein calorie malnutrition -clogged ft removed am 4/2 to discuss with POA whether to continue vs comfort rx    Anemia/ critical care assoc  - threshold to tx  Is hgb < 7    Best practice (right click and "Reselect all SmartList Selections" daily)  Diet:  Oral Pain/Anxiety/Delirium protocol (if indicated): Yes (RASS goal 0) VAP protocol (if indicated): Not indicated DVT prophylaxis: Subcutaneous Heparin and Systemic AC GI prophylaxis: PPI Glucose control:  SSI No Central venous access:  Yes, and it is still needed Arterial line:  N/A Foley:  Yes, and it is still needed Mobility:  bed rest  PT consulted: N/A Last date of multidisciplinary goals of care discussion [06/29/20] Code Status:  DNR Disposition: ICU as long as needs precedex   Labs   CBC: Recent Labs  Lab 06/27/20 1237 06/27/20 1830 06/28/20 0430 06/28/20 0535 06/29/20 0500 06/30/20 0430 07/01/20 0430  WBC 13.2*   < > 10.1 11.4* 11.1* 9.3 7.9  NEUTROABS 11.7*  --   --   --   --   --   --   HGB 9.3*   < > 7.8* 7.8* 7.3* 8.0* 7.5*  HCT 28.5*   < > 21.9* 21.9* 20.4* 23.1* 23.1*  MCV 97.6   < > 88.0 88.3 87.2 90.2 93.9  PLT 234   < > 172 158 118* 103* 80*   < > = values in this interval not displayed.    Basic Metabolic Panel: Recent Labs  Lab 06/28/20 0535 06/28/20 1517 06/29/20 0500 06/29/20 1550 06/30/20 0430 06/30/20 1615 07/01/20 0430  NA 135 135 136 137 139 137 140  K <2.0* 3.1* 3.6 3.7 3.4* 4.8 3.3*  CL 102 100 102 104 108 105 108  CO2 13* 20* 20* 20* 21* 23 23  GLUCOSE 245* 95 92 78 89 177* 91  BUN 62* 48* 29* 20 16 11 14   CREATININE 5.48* 3.54* 2.17* 1.66* 1.42* 1.33* 2.01*   CALCIUM 6.2* 6.5* 7.0* 7.4* 7.6* 7.2* 7.3*  MG 1.8 1.9 2.4  --  2.5*  --  2.5*  PHOS 3.5 2.8 2.5 2.4* 1.8* 4.4 2.9  2.9   GFR: Estimated Creatinine Clearance: 35.2 mL/min (A) (by C-G formula based on SCr of 2.01 mg/dL (H)). Recent Labs  Lab 06/27/20 1238 06/27/20 1830 06/28/20 0430 06/28/20 0535 06/28/20 1049 06/29/20 0500 06/30/20 0430 07/01/20 0430  PROCALCITON  --   --   --   --  31.73 12.88 4.17  --   WBC  --  5.5   < > 11.4*  --  11.1* 9.3 7.9  LATICACIDVEN 0.9 0.6  --   --   --  0.9  --   --    < > = values in this interval not displayed.    Liver Function Tests: Recent Labs  Lab 06/27/20 1237 06/27/20 1830 06/28/20 0535 06/29/20 0500 06/29/20 1550 06/30/20 0430 06/30/20 1615 07/01/20 0430  AST 77* 84*  --   --   --  80*  --   --   ALT 84* 77*  --   --   --  63*  --   --   ALKPHOS 178* 166*  --   --   --  108  --   --   BILITOT 1.1 1.1  --   --   --  1.3*  --   --   PROT 6.6 6.0*  --   --   --  5.0*  --   --   ALBUMIN 2.9* 2.6*   < > 2.2* 2.2* 2.1*  2.0* 2.0* 1.9*   < > = values in this interval not displayed.   No results for input(s): LIPASE, AMYLASE in the last 168 hours. No results for input(s): AMMONIA in the last 168 hours.  ABG    Component Value Date/Time   PHART 7.330 (L) 06/28/2020 0504   PCO2ART 25.7 (L) 06/28/2020 0504   PO2ART 102 06/28/2020 0504   HCO3 13.1 (L) 06/28/2020 0504   TCO2 23 04/18/2020 1205   ACIDBASEDEF 11.3 (H) 06/28/2020 0504   O2SAT 97.9 06/28/2020 0504     Coagulation Profile: No results for input(s): INR, PROTIME in the last 168 hours.  Cardiac Enzymes: Recent Labs  Lab 06/27/20 1237 06/28/20 0535  CKTOTAL 2,210* 1,233*    HbA1C: Hgb A1c MFr Bld  Date/Time Value Ref Range Status  10/18/2019 12:13 AM 5.4 4.8 - 5.6 % Final    Comment:    (NOTE) Pre diabetes:          5.7%-6.4%  Diabetes:              >6.4%  Glycemic control for   <7.0% adults with diabetes     CBG: Recent Labs  Lab  06/30/20 1156 06/30/20 1557 06/30/20 1911 06/30/20 2306 07/01/20 0316  GLUCAP 81 96 102* 104* 92      The patient is critically ill with multiple organ systems failure and requires high complexity decision making for assessment and support, frequent evaluation and titration of therapies, application of advanced monitoring technologies and extensive interpretation of multiple databases. Critical Care Time devoted to patient care services described in this note is 35 minutes.   Christinia Gully, MD Pulmonary and [place] [phone]   After 7:00 pm call Elink  [phone]

## 2020-07-01 NOTE — Progress Notes (Signed)
[facility] ROUNDING NOTE   Subjective:   Brief history.  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  He also has a remote history of Hodgkin's lymphoma. He had acute on chronic renal dysfunction with severe metabolic acidosis and altered mental status.  He was started on CRRT therapy 06/27/2020-06/30/2020.  Have discontinued CRRT therapy and monitoring renal function   Blood pressure 132/69 pulse 79 temperature 99 O2 sats 97% room air  Urine output 1000 cc 06/30/2020  IV norepinephrine IV sodium bicarbonate  Sodium 140 potassium 3.3 chloride 108 CO2 23 BUN 14 creatinine 2.0 glucose 91 calcium 7.3 phosphorus 2.9 magnesium 2.5 hemoglobin 7.5    Objective:  Vital signs in last 24 hours:  Temp:  [97.88 F (36.6 C)-100.94 F (38.3 C)] 99.5 F (37.5 C) (04/02 0900) Pulse Rate:  [58-100] 80 (04/02 0900) Resp:  [8-21] 19 (04/02 0900) BP: (93-159)/(54-90) 133/71 (04/02 0900) SpO2:  [95 %-100 %] 97 % (04/02 0900) Weight:  [60.6 kg] 60.6 kg (04/02 0435)  Weight change: 1.6 kg Filed Weights   06/29/20 0500 06/30/20 0451 07/01/20 0435  Weight: 57.5 kg 59 kg 60.6 kg    Intake/Output: I/O last 3 completed shifts: In: 2336.5 [I.V.:283; Other:47; NG/GT:567.7; IV Piggyback:1438.9] Out: 5638 [Urine:1760; Stool:45]   Intake/Output this shift:  Total I/O In: 17.7 [I.V.:17.7] Out: 350 [Urine:350]  Constitutional: Chronically ill-appearing, lying in bed, no distress ENMT: ears and nose without scars or lesions, MMM CV: normal rate, no edema Respiratory: clear to auscultation, normal work of breathing Gastrointestinal: soft, non-tender, no palpable masses or hernias Skin: no visible lesions or rashes Psych: Does not answer questions,  lethargic  Basic Metabolic Panel: Recent Labs  Lab 06/28/20 0535 06/28/20 1517 06/29/20 0500 06/29/20 1550 06/30/20 0430 06/30/20 1615 07/01/20 0430  NA 135 135 136 137 139 137 140  K <2.0* 3.1* 3.6 3.7 3.4* 4.8 3.3*  CL 102 100 102 104 108 105 108  CO2 13* 20* 20* 20* 21* 23 23  GLUCOSE 245* 95 92 78 89 177* 91  BUN 62* 48* 29* 20 16 11 14   CREATININE 5.48* 3.54* 2.17* 1.66* 1.42* 1.33* 2.01*  CALCIUM 6.2* 6.5* 7.0* 7.4* 7.6* 7.2* 7.3*  MG 1.8 1.9 2.4  --  2.5*  --  2.5*  PHOS 3.5 2.8 2.5 2.4* 1.8* 4.4 2.9  2.9    Liver Function Tests: Recent Labs  Lab 06/27/20 1237 06/27/20 1830 06/28/20 0535 06/29/20 0500 06/29/20 1550 06/30/20 0430 06/30/20 1615 07/01/20 0430  AST 77* 84*  --   --   --  80*  --   --   ALT 84* 77*  --   --   --  63*  --   --   ALKPHOS 178* 166*  --   --   --  108  --   --   BILITOT 1.1 1.1  --   --   --  1.3*  --   --   PROT 6.6 6.0*  --   --   --  5.0*  --   --   ALBUMIN 2.9* 2.6*   < > 2.2* 2.2* 2.1*  2.0* 2.0* 1.9*   < > = values in this interval not displayed.   No results for input(s): LIPASE, AMYLASE in the last 168 hours.  No results for input(s): AMMONIA in the last 168 hours.  CBC: Recent Labs  Lab 06/27/20 1237 06/27/20 1830 06/28/20 0430 06/28/20 0535 06/29/20 0500 06/30/20 0430 07/01/20 0430  WBC 13.2*   < > 10.1 11.4* 11.1* 9.3 7.9  NEUTROABS 11.7*  --   --   --   --   --   --   HGB 9.3*   < > 7.8* 7.8* 7.3* 8.0* 7.5*  HCT 28.5*   < > 21.9* 21.9* 20.4* 23.1* 23.1*  MCV 97.6   < > 88.0 88.3 87.2 90.2 93.9  PLT 234   < > 172 158 118* 103* 80*   < > = values in this interval not displayed.    Cardiac Enzymes: Recent Labs  Lab 06/27/20 1237 06/28/20 0535  CKTOTAL 2,210* 1,233*    BNP: Invalid input(s): POCBNP  CBG: Recent Labs  Lab 06/30/20 1156 06/30/20 1557 06/30/20 1911 06/30/20 2306 07/01/20 0316  GLUCAP 81 96 102* 104* 92    Microbiology: Results for orders placed or performed during the  hospital encounter of 06/27/20  Culture, blood (routine x 2)     Status: None (Preliminary result)   Collection Time: 06/27/20 12:54 PM   Specimen: BLOOD  Result Value Ref Range Status   Specimen Description BLOOD BLOOD RIGHT ARM  Final   Special Requests   Final    Blood Culture adequate volume BOTTLES DRAWN AEROBIC AND ANAEROBIC   Culture   Final    NO GROWTH 4 DAYS Performed at [facility], [address], [place], [place] [address]    Report Status PENDING  Incomplete  Culture, blood (routine x 2)     Status: None (Preliminary result)   Collection Time: 06/27/20 12:54 PM   Specimen: BLOOD  Result Value Ref Range Status   Specimen Description BLOOD BLOOD LEFT ARM  Final   Special Requests   Final    Blood Culture adequate volume BOTTLES DRAWN AEROBIC AND ANAEROBIC   Culture   Final    NO GROWTH 4 DAYS Performed at [facility], [address] [place], [place] [address]    Report Status PENDING  Incomplete  Resp Panel by RT-PCR (Flu A&B, Covid) Nasopharyngeal Swab     Status: None   Collection Time: 06/27/20  1:25 PM   Specimen: Nasopharyngeal Swab; Nasopharyngeal(NP) swabs in vial transport medium  Result Value Ref Range Status   SARS Coronavirus 2 by RT PCR NEGATIVE NEGATIVE Final    Comment: (NOTE) SARS-CoV-2 target nucleic acids are NOT DETECTED.  The SARS-CoV-2 RNA is generally detectable in upper respiratory specimens during the acute phase of infection. The lowest concentration of SARS-CoV-2 viral copies this assay can detect is 138 copies/mL. A negative result does not preclude SARS-Cov-2 infection and should not be used as the sole basis for treatment or other patient management decisions. A negative result may occur with  improper specimen collection/handling, submission of specimen other than nasopharyngeal swab, presence of viral mutation(s) within the areas targeted by this assay, and inadequate number of viral copies(<138 copies/mL). A negative result must be  combined with clinical observations, patient history, and epidemiological information. The expected result is Negative.  Fact Sheet for Patients:  EntrepreneurPulse.com.au  Fact Sheet for Healthcare Providers:  IncredibleEmployment.be  This test is no t yet approved or cleared by the [place] FDA and  has been authorized for detection and/or diagnosis of SARS-CoV-2 by FDA under an Emergency Use Authorization (EUA). This EUA will remain  in effect (  meaning this test can be used) for the duration of the COVID-19 declaration under Section 564(b)(1) of the Act, 21 U.S.C.section 360bbb-3(b)(1), unless the authorization is terminated  or revoked sooner.       Influenza A by PCR NEGATIVE NEGATIVE Final   Influenza B by PCR NEGATIVE NEGATIVE Final    Comment: (NOTE) The Xpert Xpress SARS-CoV-2/FLU/RSV plus assay is intended as an aid in the diagnosis of influenza from Nasopharyngeal swab specimens and should not be used as a sole basis for treatment. Nasal washings and aspirates are unacceptable for Xpert Xpress SARS-CoV-2/FLU/RSV testing.  Fact Sheet for Patients: EntrepreneurPulse.com.au  Fact Sheet for Healthcare Providers: IncredibleEmployment.be  This test is not yet approved or cleared by the [place] FDA and has been authorized for detection and/or diagnosis of SARS-CoV-2 by FDA under an Emergency Use Authorization (EUA). This EUA will remain in effect (meaning this test can be used) for the duration of the COVID-19 declaration under Section 564(b)(1) of the Act, 21 U.S.C. section 360bbb-3(b)(1), unless the authorization is terminated or revoked.  Performed at [facility], [address] [place], [place] [address]   MRSA PCR Screening     Status: None   Collection Time: 06/27/20  6:00 PM   Specimen: Nasal Mucosa; Nasopharyngeal  Result Value Ref Range Status   MRSA by PCR NEGATIVE NEGATIVE  Final    Comment:        The GeneXpert MRSA Assay (FDA approved for NASAL specimens only), is one component of a comprehensive MRSA colonization surveillance program. It is not intended to diagnose MRSA infection nor to guide or monitor treatment for MRSA infections. Performed at [facility], [place] [address], [place], [place] [address]     Coagulation Studies: No results for input(s): LABPROT, INR in the last 72 hours.  Urinalysis: No results for input(s): COLORURINE, LABSPEC, PHURINE, GLUCOSEU, HGBUR, BILIRUBINUR, KETONESUR, PROTEINUR, UROBILINOGEN, NITRITE, LEUKOCYTESUR in the last 72 hours.  Invalid input(s): APPERANCEUR    Imaging: DG Abd 1 View  Result Date: 07/01/2020 CLINICAL DATA:  NG tube placement EXAM: ABDOMEN - 1 VIEW COMPARISON:  06/30/2020 FINDINGS: Feeding tube tip is in the proximal stomach near the GE junction. Bilateral ureteral stents partially imaged. Prior cholecystectomy. Nonobstructive bowel gas pattern. IMPRESSION: Feeding tube tip in the proximal stomach. Electronically Signed   By: Rolm Baptise M.D.   On: 07/01/2020 01:02   DG Abd 1 View  Result Date: 06/30/2020 CLINICAL DATA:  NG tube placement EXAM: ABDOMEN - 1 VIEW COMPARISON:  06/29/2020 FINDINGS: Scattered large and small bowel gas is noted. Weighted feeding catheter is seen within the stomach. Bilateral ureteral stents are noted. IMPRESSION: Weighted feeding catheter within the stomach. Electronically Signed   By: Inez Catalina M.D.   On: 06/30/2020 01:43   DG Abd 1 View  Result Date: 06/29/2020 CLINICAL DATA:  OG tube placement. EXAM: ABDOMEN - 1 VIEW COMPARISON:  CT 06/27/2020, 2 days ago. FINDINGS: Weighted enteric tube in place, the tube is looped in the stomach, with the tip in the region of the gastric cardia. Bilateral ureteral stents in place. Nonobstructive bowel gas pattern. IMPRESSION: Weighted enteric tube in place, tube looped in the stomach, tip in the region of the  gastric cardia. Electronically Signed   By: Keith Rake M.D.   On: 06/29/2020 17:02   DG Abd Portable 1V  Result Date: 06/29/2020 CLINICAL DATA:  Feeding tube placement EXAM: PORTABLE ABDOMEN - 1 VIEW COMPARISON:  06/29/2020 at 4:01 p.m. FINDINGS: Tip  of weighted enteric tube projects over the lower medial abdomen, likely within the distal aspect of a J-shaped stomach. There are bilateral nephroureteral stents. IMPRESSION: Tip of weighted enteric tube projects over the lower medial abdomen, likely within a J-shaped stomach. Electronically Signed   By: Ulyses Jarred M.D.   On: 06/29/2020 23:11     Medications:   . sodium chloride    . dexmedetomidine (PRECEDEX) IV infusion 0.6 mcg/kg/hr (07/01/20 0803)  . feeding supplement (PIVOT 1.5 CAL) Stopped (06/30/20 2320)  . meropenem (MERREM) IV Stopped (07/01/20 0532)  . potassium chloride 10 mEq (07/01/20 0941)   . chlorhexidine  15 mL Mouth Rinse BID  . Chlorhexidine Gluconate Cloth  6 each Topical Daily  . fentaNYL  1 patch Transdermal Q72H  . gabapentin  300 mg Per Tube Q8H  . heparin  5,000 Units Subcutaneous Q8H  . mouth rinse  15 mL Mouth Rinse q12n4p  . pantoprazole (PROTONIX) IV  40 mg Intravenous QHS  . sodium chloride flush  10-40 mL Intracatheter Q12H   sodium chloride, acetaminophen, docusate, HYDROmorphone (DILAUDID) injection, polyethylene glycol, sodium chloride flush  Assessment/ Plan:   Acute on chronic kidney disease.  Multiple factors leading to acute injury.  Baseline creatinine about 5 mg/dL.  Admitted with altered mental status severe metabolic acidosis.  Initiated on CRRT 06/27/2020-06/30/2020.  We will continue to monitor renal function.  There were no plans for long-term dialysis.  We will continue ongoing dialogue with family and patient  Hypokalemia will replete  Metabolic acidosis appears to have resolved.  Chronic kidney disease with obstructive uropathy urology following  Sepsis antibiotics and cultures  as per primary service  Anemia transfuse as needed  Hypophosphatemia will replete   LOS: [place] @TODAY @11 :01 AM

## 2020-07-02 DIAGNOSIS — N179 Acute kidney failure, unspecified: Secondary | ICD-10-CM | POA: Diagnosis not present

## 2020-07-02 DIAGNOSIS — R410 Disorientation, unspecified: Secondary | ICD-10-CM | POA: Diagnosis not present

## 2020-07-02 DIAGNOSIS — N189 Chronic kidney disease, unspecified: Secondary | ICD-10-CM | POA: Diagnosis not present

## 2020-07-02 DIAGNOSIS — R579 Shock, unspecified: Secondary | ICD-10-CM | POA: Diagnosis not present

## 2020-07-02 LAB — GLUCOSE, CAPILLARY
Glucose-Capillary: 111 mg/dL — ABNORMAL HIGH (ref 70–99)
Glucose-Capillary: 124 mg/dL — ABNORMAL HIGH (ref 70–99)
Glucose-Capillary: 110 mg/dL — ABNORMAL HIGH (ref 70–99)
Glucose-Capillary: 101 mg/dL — ABNORMAL HIGH (ref 70–99)
Glucose-Capillary: 123 mg/dL — ABNORMAL HIGH (ref 70–99)
Glucose-Capillary: 107 mg/dL — ABNORMAL HIGH (ref 70–99)

## 2020-07-02 LAB — CULTURE, BLOOD (ROUTINE X 2)
Culture: NO GROWTH
Culture: NO GROWTH
Special Requests: ADEQUATE
Special Requests: ADEQUATE

## 2020-07-02 LAB — RENAL FUNCTION PANEL
Albumin: 1.8 g/dL — ABNORMAL LOW (ref 3.5–5.0)
Anion gap: 6 (ref 5–15)
BUN: 18 mg/dL (ref 6–20)
CO2: 25 mmol/L (ref 22–32)
Calcium: 7.4 mg/dL — ABNORMAL LOW (ref 8.9–10.3)
Chloride: 107 mmol/L (ref 98–111)
Creatinine, Ser: 2.89 mg/dL — ABNORMAL HIGH (ref 0.61–1.24)
GFR, Estimated: 25 mL/min — ABNORMAL LOW (ref >60)
Glucose, Bld: 111 mg/dL — ABNORMAL HIGH (ref 70–99)
Phosphorus: 2.7 mg/dL (ref 2.5–4.6)
Potassium: 4.1 mmol/L (ref 3.5–5.1)
Sodium: 138 mmol/L (ref 135–145)

## 2020-07-02 LAB — MAGNESIUM: Magnesium: 2.2 mg/dL (ref 1.7–2.4)

## 2020-07-02 MED ORDER — OXYCODONE HCL 5 MG PO TABS
5.0000 mg | ORAL_TABLET | ORAL | Status: DC | PRN
  Administered 2020-07-02: 5 mg via ORAL
  Administered 2020-07-02 – 2020-07-07 (×18): 10 mg via ORAL
  Filled 2020-07-02 (×19): qty 2

## 2020-07-02 MED ORDER — SODIUM CHLORIDE 0.9 % IV SOLN
500.0000 mg | Freq: Two times a day (BID) | INTRAVENOUS | Status: AC
  Administered 2020-07-02 – 2020-07-04 (×5): 500 mg via INTRAVENOUS
  Filled 2020-07-02: qty 500
  Filled 2020-07-02: qty 0.5
  Filled 2020-07-02 (×2): qty 500
  Filled 2020-07-02: qty 0.5

## 2020-07-02 MED ORDER — ACETAMINOPHEN 325 MG PO TABS
650.0000 mg | ORAL_TABLET | ORAL | Status: AC | PRN
Start: 2020-07-02 — End: 2020-07-02
  Administered 2020-07-02: 650 mg via ORAL
  Filled 2020-07-02: qty 2

## 2020-07-02 MED ORDER — ACETAMINOPHEN 325 MG PO TABS
650.0000 mg | ORAL_TABLET | Freq: Four times a day (QID) | ORAL | Status: DC | PRN
  Administered 2020-07-02 – 2020-07-06 (×5): 650 mg via ORAL
  Filled 2020-07-02 (×5): qty 2

## 2020-07-02 MED ORDER — DULOXETINE HCL 30 MG PO CPEP
30.0000 mg | ORAL_CAPSULE | Freq: Two times a day (BID) | ORAL | Status: DC
  Administered 2020-07-02 – 2020-07-07 (×11): 30 mg via ORAL
  Filled 2020-07-02 (×11): qty 1

## 2020-07-02 MED ORDER — HYDROMORPHONE HCL 1 MG/ML IJ SOLN
0.5000 mg | INTRAMUSCULAR | Status: DC | PRN
  Administered 2020-07-02 – 2020-07-07 (×13): 1 mg via INTRAVENOUS
  Filled 2020-07-02 (×13): qty 1

## 2020-07-02 NOTE — Progress Notes (Signed)
Pharmacy Antibiotic Note  DELVECCHIO [name] is a 55 y.o. male with hx CKD, loop colostomy and bilateral hydronephrosis (s/p bilateral ureteral stents) presented to the ED on 06/27/2020 with AMS. He was found to be in acute renal failure with severe acidosis.  He was started on vancomycin and ceftriaxone for suspected sepsis. Ceftriaxone changed to meropenem on 3/30.  - On CRRT from 3/29 -  4/1  Today, 07/02/2020: - day #5 abx - Tmax 100.6, wbc wnl - all cultures have been negative thus far -  CRRT d/ced on 4/1 -  scr trending up 2.89 since he's been off CRRT (crcl~25) - Per CCM, plan at this time is to treat with abx for at least 7 days  Plan: - adjust meropenem dose of 500 gm IV q12h - f/u with cultures, clinical status and renal function  _________________________________________  Height: 5\' 6"  (167.6 cm) Weight: 62.9 kg (138 lb 10.7 oz) IBW/kg (Calculated) : 63.8  Temp (24hrs), Avg:99.6 F (37.6 C), Min:98.4 F (36.9 C), Max:100.58 F (38.1 C)  Recent Labs  Lab 06/27/20 1238 06/27/20 1830 06/28/20 0430 06/28/20 0535 06/28/20 1517 06/29/20 0500 06/29/20 1550 06/30/20 0430 06/30/20 1615 07/01/20 0430 07/01/20 2034 07/02/20 0500  WBC  --  5.5 10.1 11.4*  --  11.1*  --  9.3  --  7.9  --   --   CREATININE  --  10.16*  --  5.48*   < > 2.17*   < > 1.42* 1.33* 2.01* 2.53* 2.89*  LATICACIDVEN 0.9 0.6  --   --   --  0.9  --   --   --   --   --   --    < > = values in this interval not displayed.    Estimated Creatinine Clearance: 25.4 mL/min (A) (by C-G formula based on SCr of 2.89 mg/dL (H)).    Allergies  Allergen Reactions  . Nsaids Other (See Comments)    Bleeding GI Ulceration  . Ciprofloxacin Nausea And Vomiting    Antimicrobials this admission: 3/29 CTX>>3/30 3/30 vanc>>4/1 3/30 meropenem>>  Microbiology results: 3/29 flu/covid (-) 3/29 MRSA pcr: neg 3/30 Hep A B C all NR 3/29 Bcx x2:    Thank you for allowing pharmacy to be a part of this patient's  care.  Lynelle Doctor 07/02/2020 7:07 AM

## 2020-07-02 NOTE — Progress Notes (Addendum)
NAME:  Chase [name], MRN:  [id number], DOB:  [birth year], LOS: 5 ADMISSION DATE:  06/27/2020, CONSULTATION DATE: 06/27/2020 REFERRING MD: Dr. 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  He was admitted in January/February 2022 with acute on chronic renal failure related to obstructive nephropathy after his nephrostomy tubes were pulled.  He had internal ureteral stents replaced at that time.  A diverting loop colostomy was also performed for the fistula. He is on high-dose Neurontin and narcotics for chronic pain history of PTSD  He was found 3/29 on the floor by his son, unclear downtime.  Last seen well midday 3/28 when he was evaluated by EMS after a fall, question narcotics overdose.  He was not brought to the ED at that time.  His son reported that overall his health has been in steady decline, has difficulty with ADLs.  In the [facility] ED on 3/29 found to have a profound metabolic acidosis with acute on chronic renal failure, elevated CK, relative hypotension, mild transaminitis.  He was started on IV bicarbonate, IV fluid resuscitation, received empiric ceftriaxone.    Pertinent  Medical History   Past Medical History:  Diagnosis Date  . Cervical radiculopathy    with left sided weakness  . Chemical exposure    service related injury  . Chronic pain   . CKD (chronic kidney disease), stage IV ([place]) 10/17/2019  . COVID-19   . GERD (gastroesophageal reflux disease)   . GIB (gastrointestinal bleeding)   . Heart attack ([place])   . History of  CVA (cerebrovascular accident) 12/31/2019  . 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 History of stomach ulcers 05/19/2013   Formatting of this note might be different from the original. Attributed to chronic ibuprofen use  . Hodgkin lymphoma, unspecified, unspecified site ([place]) 12/10/2012   Formatting of this note might be different from the original. In remission.  . Hypokalemia   . Hypomagnesemia   . PTSD (post-traumatic stress disorder)   . Renal disorder    CKD  . Stroke ([name])   . Urinary retention    due to bladder outlet obstruction    Significant Hospital Events: Including procedures, antibiotic start and stop dates in addition to other pertinent events   . UDS 3/29 >> negative . CT abdomen/pelvis 3/29 >> bilateral renal atrophy, bilateral ureteral stents are noted in good position without any hydronephrosis.  Foley catheter present.  History gastric surgery with left lower quadrant colostomy. . Renal ultrasound 3/29 >> stable moderate bilateral hydroureteronephrosis, right ureteral stent extending to the renal pelvis, left ureteral stent faintly visualized.  Echogenic focus in the left kidney question nonobstructing stone or air bubble . CT pelvis/left hip 3/29 >> no evidence of hip fracture or dislocation normal joint space, decreased offset of the bilateral femoral head neck junctions without suspicious bony lesion . Chest x-ray 3/29 >> lungs clear, no infiltrates . Head CT 3/29 >> no acute intracranial abnormality, old right occipital lobe infarct . Blood cultures 3/29 ([facility]) >> neg  . CVVHD started 3/29 with significant improvement in severe metabolic acidosis . Developed shock 3/29 when CVVH started, on norepinephrine .  NG tube attempted 3/30, unsuccessful . Echocardiogram 3/30 >> LVEF 60-65% without regional wall motion abnormality, no diastolic dysfunction.  Normal RV size and function, normal PASP.  No mention of valvular abnormality . 4/1 d/c'd cvvh . Physical therapy/  mobilization started  . AM Cortisol 4/4 >>>   Scheduled Meds: . chlorhexidine  15 mL Mouth Rinse BID  . Chlorhexidine Gluconate Cloth  6 each Topical Daily  . fentaNYL  1 patch Transdermal Q72H  . gabapentin  300 mg Oral TID  . heparin  5,000 Units Subcutaneous Q8H  . mouth rinse  15 mL Mouth Rinse q12n4p  . pantoprazole  40 mg Oral Daily  . sodium chloride flush  10-40 mL Intracatheter Q12H   Continuous Infusions: . dexmedetomidine (PRECEDEX) IV infusion 0.5 mcg/kg/hr (07/02/20 0803)  . dextrose 5% lactated ringers 100 mL/hr at 07/02/20 0700  . meropenem (MERREM) IV     PRN Meds:.HYDROmorphone (DILAUDID) injection, polyethylene glycol, sodium chloride flush   Interim History / Subjective:  Much more alert/ conversant, advancing diet    Objective   Blood pressure 123/62, pulse 62, temperature 99.86 F (37.7 C), resp. rate 15, height 5\' 6"  (1.676 m), weight 62.9 kg, SpO2 98 %. CVP:  [2 mmHg] 2 mmHg      Intake/Output Summary (Last 24 hours) at 07/02/2020 0828 Last data filed at 07/02/2020 0800 Gross per 24 hour  Intake 3566.47 ml  Output 3050 ml  Net 516.47 ml   Filed Weights   06/30/20 0451 07/01/20 0435 07/02/20 0500  Weight: 59 kg 60.6 kg 62.9 kg   CVP:  [2 mmHg] 2 mmHg   Examination: Tmax  100.,6   (not change)  Pt alert, approp nad  / sats 98% RA  No jvd Oropharynx clear,  mucosa nl Neck supple Lungs with a few scattered exp > insp rhonchi bilaterally RRR no s3 or or sign murmur Abd obese with nl excursion  Extr warm prevalon boots/ pas  Neuro  Sensorium intact,  no apparent motor deficits    Resolved Hospital Problem list   Severe anion gap metabolic acidosis with normal lactate, EtOH negative.  Resolved.  Presumed due to the above.  Volatile alcohol screen picked up acetone, no others. Probable rhabdomyolysis; CK down to 1233 on 3/30 Shock, presumed hypovolemic, possibly septic.  Echocardiogram 3/30 was normal  Assessment & Plan:  Acute on chronic  (stage IV-V) renal failure.  Long history of ureteral obstruction but current CT abdomen did not show hydronephrosis.  Question due to hypovolemia, rhabdomyolysis, question sepsis. Hypokalemia and hypomagnesemia on CVVHD- d/c'd 4/1 -Urology reviewed his CT abdomen and there was no clear role for percutaneous nephrostomy tubes - Stress dose steroids discontinued 3/31  .   Lab Results  Component Value Date   CREATININE 2.89 (H) 07/02/2020   CREATININE 2.53 (H) 07/01/2020   CREATININE 2.01 (H) 07/01/2020   - UOP continues quite high and cvp low so will check cortisol level 4/4 am     Acute respiratory failure.  Principally due to airway protection and secretions/ improved     - Now on RA/ NCB > started to advance as tol  4/2 and doing fine so far  .     Septic shock with low bp and high prolactin but no def source though urine not obtained  -Blood cultures from [facility] negative so far.  Antibiotics day 6: Broadened to vancomycin and meropenem on 3/30.  Stopped vancomycin 4/1, continue total antibiotics at least 7 days  Acute toxic  metabolic encephalopathy.  Due to his metabolic disarray, renal failure.    UDS is negative.  Volatile alcohols positive acetone, unclear significance.  Suspect also components of narcotics withdrawal, gabapentin withdrawal >>> precedex working on titrating completely of am 4/3   Chronic pain  -  Restarted cymbalta am 4/3    CAD -Home aspirin and Plavix, statin are all on hold.  Plan to restart when stable from acute events  Mild transaminitis.  Question shock liver.  Hepatitis panel negative -recheck lfts am 4/4  Diverting colostomy, history of colovesicular fistula -Standard colostomy care  Protein calorie malnutrition -clogged ft removed am 4/2  > push po nutrition    Anemia/ critical care assoc  Lab Results  Component Value Date   HGB 7.5 (L) 07/01/2020   HGB 8.0 (L) 06/30/2020   HGB 7.3 (L) 06/29/2020    - threshold to tx  Is hgb < 7    Best  practice (right click and "Reselect all SmartList Selections" daily)  Diet:  Oral Pain/Anxiety/Delirium protocol (if indicated): Yes (RASS goal 0) VAP protocol (if indicated): Not indicated DVT prophylaxis: Subcutaneous Heparin and Systemic AC GI prophylaxis: PPI Glucose control:  SSI No Central venous access:  Yes, and it is still needed Arterial line:  N/A Foley:  Yes, and it is still needed Mobility:  OOB as tol  PT consulted: Yes Last date of multidisciplinary goals of care discussion [06/29/20] Code Status:  DNR Disposition: ICU as long as needs precedex   Labs   CBC: Recent Labs  Lab 06/27/20 1237 06/27/20 1830 06/28/20 0430 06/28/20 0535 06/29/20 0500 06/30/20 0430 07/01/20 0430  WBC 13.2*   < > 10.1 11.4* 11.1* 9.3 7.9  NEUTROABS 11.7*  --   --   --   --   --   --   HGB 9.3*   < > 7.8* 7.8* 7.3* 8.0* 7.5*  HCT 28.5*   < > 21.9* 21.9* 20.4* 23.1* 23.1*  MCV 97.6   < > 88.0 88.3 87.2 90.2 93.9  PLT 234   < > 172 158 118* 103* 80*   < > = values in this interval not displayed.    Basic Metabolic Panel: Recent Labs  Lab 06/28/20 0535 06/28/20 1517 06/29/20 0500 06/29/20 1550 06/30/20 0430 06/30/20 1615 07/01/20 0430 07/01/20 2034 07/02/20 0500  NA 135 135 136   < > 139 137 140 138 138  K <2.0* 3.1* 3.6   < > 3.4* 4.8 3.3* 4.0 4.1  CL 102 100 102   < > 108 105 108 107 107  CO2 13* 20* 20*   < > 21* 23 23 25 25   GLUCOSE 245* 95 92   < > 89 177* 91 110* 111*  BUN 62* 48* 29*   < > 16 11 14 17 18   CREATININE 5.48* 3.54* 2.17*   < > 1.42* 1.33* 2.01* 2.53* 2.89*  CALCIUM 6.2* 6.5* 7.0*   < > 7.6* 7.2* 7.3* 7.1* 7.4*  MG 1.8 1.9 2.4  --  2.5*  --  2.5*  --   --   PHOS 3.5 2.8 2.5   < > 1.8* 4.4 2.9  2.9 2.7 2.7   < > = values in this interval not displayed.   GFR: Estimated Creatinine Clearance: 25.4 mL/min (A) (by C-G formula based on SCr of 2.89 mg/dL (H)). Recent Labs  Lab 06/27/20 1238 06/27/20 1830 06/28/20 0430 06/28/20 0535 06/28/20 1049  06/29/20 0500 06/30/20 0430 07/01/20 0430  PROCALCITON  --   --   --   --  31.73 12.88 4.17  --   WBC  --  5.5   < > 11.4*  --  11.1* 9.3 7.9  LATICACIDVEN 0.9 0.6  --   --   --  0.9  --   --    < > = values in this interval not displayed.    Liver Function Tests: Recent Labs  Lab 06/27/20 1237 06/27/20 1830 06/28/20 0535 06/30/20 0430 06/30/20 1615 07/01/20 0430 07/01/20 2034 07/02/20 0500  AST 77* 84*  --  80*  --   --   --   --   ALT 84* 77*  --  63*  --   --   --   --   ALKPHOS 178* 166*  --  108  --   --   --   --   BILITOT 1.1 1.1  --  1.3*  --   --   --   --   PROT 6.6 6.0*  --  5.0*  --   --   --   --   ALBUMIN 2.9* 2.6*   < > 2.1*  2.0* 2.0* 1.9* 1.8* 1.8*   < > = values in this interval not displayed.   No results for input(s): LIPASE, AMYLASE in the last 168 hours. No results for input(s): AMMONIA in the last 168 hours.  ABG    Component Value Date/Time   PHART 7.330 (L) 06/28/2020 0504   PCO2ART 25.7 (L) 06/28/2020 0504   PO2ART 102 06/28/2020 0504   HCO3 13.1 (L) 06/28/2020 0504   TCO2 23 04/18/2020 1205   ACIDBASEDEF 11.3 (H) 06/28/2020 0504   O2SAT 97.9 06/28/2020 0504     Coagulation Profile: No results for input(s): INR, PROTIME in the last 168 hours.  Cardiac Enzymes: Recent Labs  Lab 06/27/20 1237 06/28/20 0535  CKTOTAL 2,210* 1,233*    HbA1C: Hgb A1c MFr Bld  Date/Time Value Ref Range Status  10/18/2019 12:13 AM 5.4 4.8 - 5.6 % Final    Comment:    (NOTE) Pre diabetes:          5.7%-6.4%  Diabetes:              >6.4%  Glycemic control for   <7.0% adults with diabetes     CBG: Recent Labs  Lab 07/01/20 1557 07/01/20 1941 07/01/20 2331 07/02/20 0342 07/02/20 0730  GLUCAP 115* 100* 111* 124* 110*      Christinia Gully, MD Pulmonary and [place] [phone]   After 7:00 pm call Elink  [phone]

## 2020-07-02 NOTE — Progress Notes (Signed)
[facility] ROUNDING NOTE   Subjective:   Brief history.  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  He also has a remote history of Hodgkin's lymphoma. He had acute on chronic renal dysfunction with severe metabolic acidosis and altered mental status.  He was started on CRRT therapy 06/27/2020-06/30/2020.  Have discontinued CRRT therapy and monitoring renal function   Blood pressure 135/66 pulse 67 temperature 101 O2 sats 100% on room air  Urine output 2.8 L 07/01/2020   IV norepinephrine IV lactate Ringer's at 100 cc an hour  Sodium 138 potassium 4.1 chloride 107 CO2 25 BUN 18 creatinine 2.89 glucose 111 calcium 7.4 albumin 1.8 hemoglobin 7.5    Objective:  Vital signs in last 24 hours:  Temp:  [98.96 F (37.2 C)-100.76 F (38.2 C)] 100.76 F (38.2 C) (04/03 0900) Pulse Rate:  [56-83] 66 (04/03 0900) Resp:  [9-23] 14 (04/03 0900) BP: (89-142)/(48-81) 135/66 (04/03 0900) SpO2:  [95 %-100 %] 99 % (04/03 0900) Weight:  [62.9 kg] 62.9 kg (04/03 0500)  Weight change: 2.3 kg Filed Weights   06/30/20 0451 07/01/20 0435 07/02/20 0500  Weight: 59 kg 60.6 kg 62.9 kg    Intake/Output: I/O last 3 completed shifts: In: 3854.9 [P.O.:600; I.V.:1989; NG/GT:126.7; IV Piggyback:1139.2] Out: 3546 [FKCLE:7517; Stool:110]   Intake/Output this shift:  Total I/O In: 429.9 [P.O.:220; I.V.:209.9] Out: 500 [Urine:475; Stool:25]  Constitutional: Chronically ill-appearing, lying in bed, no distress ENMT: ears and nose without scars or lesions, MMM CV: normal rate, no edema Respiratory: clear to auscultation, normal work of breathing Gastrointestinal: soft, non-tender, no palpable masses or hernias Skin: no visible lesions or rashes Psych: Does not  answer questions, lethargic  Basic Metabolic Panel: Recent Labs  Lab 06/28/20 0535 06/28/20 1517 06/29/20 0500 06/29/20 1550 06/30/20 0430 06/30/20 1615 07/01/20 0430 07/01/20 2034 07/02/20 0500  NA 135 135 136   < > 139 137 140 138 138  K <2.0* 3.1* 3.6   < > 3.4* 4.8 3.3* 4.0 4.1  CL 102 100 102   < > 108 105 108 107 107  CO2 13* 20* 20*   < > 21* 23 23 25 25   GLUCOSE 245* 95 92   < > 89 177* 91 110* 111*  BUN 62* 48* 29*   < > 16 11 14 17 18   CREATININE 5.48* 3.54* 2.17*   < > 1.42* 1.33* 2.01* 2.53* 2.89*  CALCIUM 6.2* 6.5* 7.0*   < > 7.6* 7.2* 7.3* 7.1* 7.4*  MG 1.8 1.9 2.4  --  2.5*  --  2.5*  --   --   PHOS 3.5 2.8 2.5   < > 1.8* 4.4 2.9  2.9 2.7 2.7   < > = values in this interval not displayed.    Liver Function Tests: Recent Labs  Lab 06/27/20 1237 06/27/20 1830 06/28/20 0535 06/30/20 0430 06/30/20 1615 07/01/20 0430 07/01/20 2034 07/02/20 0500  AST 77* 84*  --  80*  --   --   --   --   ALT 84* 77*  --  63*  --   --   --   --   ALKPHOS 178* 166*  --  108  --   --   --   --   BILITOT 1.1 1.1  --  1.3*  --   --   --   --   PROT 6.6 6.0*  --  5.0*  --   --   --   --   ALBUMIN 2.9* 2.6*   < > 2.1*  2.0* 2.0* 1.9* 1.8* 1.8*   < > = values in this interval not displayed.   No results for input(s): LIPASE, AMYLASE in the last 168 hours. No results for input(s): AMMONIA in the last 168 hours.  CBC: Recent Labs  Lab 06/27/20 1237 06/27/20 1830 06/28/20 0430 06/28/20 0535 06/29/20 0500 06/30/20 0430 07/01/20 0430  WBC 13.2*   < > 10.1 11.4* 11.1* 9.3 7.9  NEUTROABS 11.7*  --   --   --   --   --   --   HGB 9.3*   < > 7.8* 7.8* 7.3* 8.0* 7.5*  HCT 28.5*   < > 21.9* 21.9* 20.4* 23.1* 23.1*  MCV 97.6   < > 88.0 88.3 87.2 90.2 93.9  PLT 234   < > 172 158 118* 103* 80*   < > = values in this interval not displayed.    Cardiac Enzymes: Recent Labs  Lab 06/27/20 1237 06/28/20 0535  CKTOTAL 2,210* 1,233*    BNP: Invalid input(s):  POCBNP  CBG: Recent Labs  Lab 07/01/20 1557 07/01/20 1941 07/01/20 2331 07/02/20 0342 07/02/20 0730  GLUCAP 115* 100* 111* 124* 110*    Microbiology: Results for orders placed or performed during the hospital encounter of 06/27/20  Culture, blood (routine x 2)     Status: None (Preliminary result)   Collection Time: 06/27/20 12:54 PM   Specimen: BLOOD  Result Value Ref Range Status   Specimen Description BLOOD BLOOD RIGHT ARM  Final   Special Requests   Final    Blood Culture adequate volume BOTTLES DRAWN AEROBIC AND ANAEROBIC   Culture   Final    NO GROWTH 4 DAYS Performed at [facility], [address] [place], [place] [address]    Report Status PENDING  Incomplete  Culture, blood (routine x 2)     Status: None (Preliminary result)   Collection Time: 06/27/20 12:54 PM   Specimen: BLOOD  Result Value Ref Range Status   Specimen Description BLOOD BLOOD LEFT ARM  Final   Special Requests   Final    Blood Culture adequate volume BOTTLES DRAWN AEROBIC AND ANAEROBIC   Culture   Final    NO GROWTH 4 DAYS Performed at [facility], [address] [place], [place] [address]    Report Status PENDING  Incomplete  Resp Panel by RT-PCR (Flu A&B, Covid) Nasopharyngeal Swab     Status: None   Collection Time: 06/27/20  1:25 PM   Specimen: Nasopharyngeal Swab; Nasopharyngeal(NP) swabs in vial transport medium  Result Value Ref Range Status   SARS Coronavirus 2 by RT PCR NEGATIVE NEGATIVE Final    Comment: (NOTE) SARS-CoV-2 target nucleic acids are NOT DETECTED.  The SARS-CoV-2 RNA is generally detectable in upper respiratory specimens during the acute phase of infection. The lowest concentration of SARS-CoV-2 viral copies this assay can detect is 138 copies/mL. A negative result does not preclude SARS-Cov-2 infection and should not be used as the sole basis for treatment or other patient management decisions. A negative result may occur with  improper specimen  collection/handling, submission of specimen other than nasopharyngeal swab, presence of viral mutation(s) within the areas targeted by this assay, and inadequate number of viral copies(<138 copies/mL). A negative result  must be combined with clinical observations, patient history, and epidemiological information. The expected result is Negative.  Fact Sheet for Patients:  EntrepreneurPulse.com.au  Fact Sheet for Healthcare Providers:  IncredibleEmployment.be  This test is no t yet approved or cleared by the [place] FDA and  has been authorized for detection and/or diagnosis of SARS-CoV-2 by FDA under an Emergency Use Authorization (EUA). This EUA will remain  in effect (meaning this test can be used) for the duration of the COVID-19 declaration under Section 564(b)(1) of the Act, 21 U.S.C.section 360bbb-3(b)(1), unless the authorization is terminated  or revoked sooner.       Influenza A by PCR NEGATIVE NEGATIVE Final   Influenza B by PCR NEGATIVE NEGATIVE Final    Comment: (NOTE) The Xpert Xpress SARS-CoV-2/FLU/RSV plus assay is intended as an aid in the diagnosis of influenza from Nasopharyngeal swab specimens and should not be used as a sole basis for treatment. Nasal washings and aspirates are unacceptable for Xpert Xpress SARS-CoV-2/FLU/RSV testing.  Fact Sheet for Patients: EntrepreneurPulse.com.au  Fact Sheet for Healthcare Providers: IncredibleEmployment.be  This test is not yet approved or cleared by the [place] FDA and has been authorized for detection and/or diagnosis of SARS-CoV-2 by FDA under an Emergency Use Authorization (EUA). This EUA will remain in effect (meaning this test can be used) for the duration of the COVID-19 declaration under Section 564(b)(1) of the Act, 21 U.S.C. section 360bbb-3(b)(1), unless the authorization is terminated or revoked.  Performed at [facility], [address], [place], [place] [address]   MRSA PCR Screening     Status: None   Collection Time: 06/27/20  6:00 PM   Specimen: Nasal Mucosa; Nasopharyngeal  Result Value Ref Range Status   MRSA by PCR NEGATIVE NEGATIVE Final    Comment:        The GeneXpert MRSA Assay (FDA approved for NASAL specimens only), is one component of a comprehensive MRSA colonization surveillance program. It is not intended to diagnose MRSA infection nor to guide or monitor treatment for MRSA infections. Performed at [facility], [place] [address], [place], [place] [address]     Coagulation Studies: No results for input(s): LABPROT, INR in the last 72 hours.  Urinalysis: No results for input(s): COLORURINE, LABSPEC, PHURINE, GLUCOSEU, HGBUR, BILIRUBINUR, KETONESUR, PROTEINUR, UROBILINOGEN, NITRITE, LEUKOCYTESUR in the last 72 hours.  Invalid input(s): APPERANCEUR    Imaging: DG Abd 1 View  Result Date: 07/01/2020 CLINICAL DATA:  NG tube placement EXAM: ABDOMEN - 1 VIEW COMPARISON:  06/30/2020 FINDINGS: Feeding tube tip is in the proximal stomach near the GE junction. Bilateral ureteral stents partially imaged. Prior cholecystectomy. Nonobstructive bowel gas pattern. IMPRESSION: Feeding tube tip in the proximal stomach. Electronically Signed   By: Rolm Baptise M.D.   On: 07/01/2020 01:02     Medications:   . dexmedetomidine (PRECEDEX) IV infusion 0.4 mcg/kg/hr (07/02/20 0911)  . dextrose 5% lactated ringers 100 mL/hr at 07/02/20 0912  . meropenem (MERREM) IV     . chlorhexidine  15 mL Mouth Rinse BID  . Chlorhexidine Gluconate Cloth  6 each Topical Daily  . DULoxetine  30 mg Oral BID  . fentaNYL  1 patch Transdermal Q72H  . gabapentin  300 mg Oral TID  . heparin  5,000 Units Subcutaneous Q8H  . mouth rinse  15 mL Mouth Rinse q12n4p  . pantoprazole  40 mg Oral Daily  . sodium chloride flush  10-40 mL Intracatheter Q12H   HYDROmorphone (DILAUDID) injection,  oxyCODONE, polyethylene glycol, sodium chloride flush  Assessment/ Plan:   Acute on chronic kidney disease.  Multiple factors leading to acute injury.  Baseline creatinine about 5 mg/dL.  Admitted with altered mental status severe metabolic acidosis.  Initiated on CRRT 06/27/2020-06/30/2020.  CRRT discontinued 06/30/2020 we will continue to monitor renal function.  There were no plans for long-term dialysis.  We will continue ongoing dialogue with family and patient  Hypokalemia will replete  Metabolic acidosis appears to have resolved.  Chronic kidney disease with obstructive uropathy urology following  Sepsis antibiotics and cultures as per primary service  Anemia transfuse as needed  Hypophosphatemia appears resolved at this point   LOS: [place] @TODAY @9 :47 AM

## 2020-07-03 DIAGNOSIS — N179 Acute kidney failure, unspecified: Secondary | ICD-10-CM | POA: Diagnosis not present

## 2020-07-03 DIAGNOSIS — R579 Shock, unspecified: Secondary | ICD-10-CM | POA: Diagnosis not present

## 2020-07-03 DIAGNOSIS — N189 Chronic kidney disease, unspecified: Secondary | ICD-10-CM | POA: Diagnosis not present

## 2020-07-03 DIAGNOSIS — R410 Disorientation, unspecified: Secondary | ICD-10-CM | POA: Diagnosis not present

## 2020-07-03 LAB — GLUCOSE, CAPILLARY
Glucose-Capillary: 101 mg/dL — ABNORMAL HIGH (ref 70–99)
Glucose-Capillary: 106 mg/dL — ABNORMAL HIGH (ref 70–99)
Glucose-Capillary: 111 mg/dL — ABNORMAL HIGH (ref 70–99)
Glucose-Capillary: 86 mg/dL (ref 70–99)

## 2020-07-03 LAB — COMPREHENSIVE METABOLIC PANEL
ALT: 27 U/L (ref 0–44)
AST: 22 U/L (ref 15–41)
Albumin: 1.7 g/dL — ABNORMAL LOW (ref 3.5–5.0)
Alkaline Phosphatase: 85 U/L (ref 38–126)
Anion gap: 5 (ref 5–15)
BUN: 20 mg/dL (ref 6–20)
CO2: 24 mmol/L (ref 22–32)
Calcium: 7.1 mg/dL — ABNORMAL LOW (ref 8.9–10.3)
Chloride: 106 mmol/L (ref 98–111)
Creatinine, Ser: 3.2 mg/dL — ABNORMAL HIGH (ref 0.61–1.24)
GFR, Estimated: 22 mL/min — ABNORMAL LOW (ref >60)
Glucose, Bld: 99 mg/dL (ref 70–99)
Potassium: 3.9 mmol/L (ref 3.5–5.1)
Sodium: 135 mmol/L (ref 135–145)
Total Bilirubin: 0.4 mg/dL (ref 0.3–1.2)
Total Protein: 4.3 g/dL — ABNORMAL LOW (ref 6.5–8.1)

## 2020-07-03 LAB — CBC
HCT: 23.4 % — ABNORMAL LOW (ref 39.0–52.0)
Hemoglobin: 7.4 g/dL — ABNORMAL LOW (ref 13.0–17.0)
MCH: 30.8 pg (ref 26.0–34.0)
MCHC: 31.6 g/dL (ref 30.0–36.0)
MCV: 97.5 fL (ref 80.0–100.0)
Platelets: 99 10*3/uL — ABNORMAL LOW (ref 150–400)
RBC: 2.4 MIL/uL — ABNORMAL LOW (ref 4.22–5.81)
RDW: 16 % — ABNORMAL HIGH (ref 11.5–15.5)
WBC: 4 10*3/uL (ref 4.0–10.5)
nRBC: 0 % (ref 0.0–0.2)

## 2020-07-03 LAB — MAGNESIUM: Magnesium: 1.9 mg/dL (ref 1.7–2.4)

## 2020-07-03 LAB — CORTISOL: Cortisol, Plasma: 7.7 ug/dL

## 2020-07-03 MED ORDER — PROSOURCE PLUS PO LIQD
30.0000 mL | Freq: Two times a day (BID) | ORAL | Status: DC
  Filled 2020-07-03 (×2): qty 30

## 2020-07-03 MED ORDER — ENSURE ENLIVE PO LIQD
237.0000 mL | Freq: Two times a day (BID) | ORAL | Status: DC
  Administered 2020-07-05: 237 mL via ORAL

## 2020-07-03 NOTE — TOC Progression Note (Signed)
Transition of Care [facility]) - Progression Note    Patient Details  Name: STEPHENSON CICHY MRN: [id number] Date of Birth: [birth year]  Transition of Care [facility]) CM/SW Contact  Leeroy Cha, RN Phone Number: 07/03/2020, 7:28 AM  Clinical Narrative:    Significant Hospital Events: Including procedures, antibiotic start and stop dates in addition to other pertinent events    UDS 3/29 >> negative  CT abdomen/pelvis 3/29 >> bilateral renal atrophy, bilateral ureteral stents are noted in good position without any hydronephrosis.  Foley catheter present.  History gastric surgery with left lower quadrant colostomy.  Renal ultrasound 3/29 >> stable moderate bilateral hydroureteronephrosis, right ureteral stent extending to the renal pelvis, left ureteral stent faintly visualized.  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  Normal RV size and function, normal PASP.  No mention of valvular abnormality  4/1 d/c'd cvvh  Physical therapy/ mobilization started   AM Cortisol 4/4 >>>  PLAN snf versus cir   Expected Discharge Plan: [place] Barriers to Discharge: Continued Medical Work up  Expected Discharge Plan and Services Expected Discharge Plan: [place]   Discharge Planning Services: CM  Consult   Living arrangements for the past 2 months: Single Family Home                                       Social Determinants of Health (SDOH) Interventions    Readmission Risk Interventions Readmission Risk Prevention Plan 12/25/2019 11/19/2019  Transportation Screening Complete Complete  Medication Review Press photographer) Complete Complete  PCP or Specialist appointment within 3-5 days of discharge - Complete  HRI or [place] - Complete  SW Recovery Care/Counseling Consult - Complete  [place] - Complete  Some recent data might be hidden

## 2020-07-03 NOTE — Progress Notes (Signed)
[facility] ROUNDING NOTE   Subjective:   Brief history.  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  He also has a remote history of Hodgkin's lymphoma. He had acute on chronic renal dysfunction with severe metabolic acidosis and altered mental status.  He was started on CRRT therapy 06/27/2020-06/30/2020.  Have discontinued CRRT therapy and monitoring renal function   Seen in room. Pt up in the chair.  Pt states he is living at home by himself now, gets around w/ a walker mostly and has a motorized machine on the way. F[facility]b [facility]. He has driven his car recently a couple of times. Taking care of himself. He is interested in dialysis if need be and would prefer [facility] but if they don't take [place] insurance he will happily be seen for kidney needs at the [place] in [place]. He lives in [place].     Objective:  Vital signs in last 24 hours:  Temp:  [98.6 F (37 C)-100.22 F (37.9 C)] 98.6 F (37 C) (04/04 1456) Pulse Rate:  [55-74] 71 (04/04 1456) Resp:  [10-18] 18 (04/04 1456) BP: (96-155)/(20-107) 125/86 (04/04 1456) SpO2:  [94 %-100 %] 99 % (04/04 1456) Weight:  [62.3 kg] 62.3 kg (04/04 0400)  Weight change: -0.6 kg Filed Weights   07/01/20 0435 07/02/20 0500 07/03/20 0400  Weight: 60.6 kg 62.9 kg 62.3 kg    Intake/Output: I/O last 3 completed shifts: In: 4961.5 [P.O.:900; I.V.:3761.4; IV Piggyback:300.1] Out: 4015 [Urine:3950; Stool:65]   Intake/Output this shift:  Total I/O In: -  Out: 950 [Urine:950]  Exam: Constitutional: sitting up in chair eating breakfast, no distress ENMT: ears and nose without scars or lesions, MMM CV: normal rate, no edema Respiratory: clear to auscultation, normal work of breathing Gastrointestinal:  soft, non-tender, no palpable masses or hernias, +colostomy Skin: no visible lesions or rashes Neuro: nonfocal, Ox 3, conversant   Assessment/ Plan:  1. Acute on chronic kidney disease.  Multiple factors leading to acute injury.  Baseline creatinine about 5 mg/dL.  Admitted with altered mental status severe metabolic acidosis.  SP CRRT dc'd on 06/30/2020. Not the 1st time he has had AKI w/ temporary HD.  Pt is significantly less deconditioned than last yr's admission, living at home alone, caring for himself, uses a walker and has driven his car recently a couple of times. Would consider him now as a long-term HD candidate. 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  Will cont to follow renal labs post CRRT.   2. Hypokalemia- replete prn  3. Metabolic acidosis- appears to have resolved. 4. Chronic kidney disease-  with obstructive uropathy, urology following 5. Sepsis antibiotics and cultures as per primary service 6. Anemia transfuse as needed  Kelly Splinter, MD 07/03/2020, 4:06 PM   Basic Metabolic Panel: Recent Labs  Lab 06/29/20 0500 06/29/20 1550 06/30/20 0430 06/30/20 1615 07/01/20 0430 07/01/20 2034 07/02/20 0500 07/03/20 0500  NA 136   < > 139 137 140 138 138 135  K 3.6   < > 3.4* 4.8 3.3* 4.0 4.1 3.9  CL 102   < > 108 105 108 107 107 106  CO2 20*   < >  21* 23 23 25 25 24   GLUCOSE 92   < > 89 177* 91 110* 111* 99  BUN 29*   < > 16 11 14 17 18 20   CREATININE 2.17*   < > 1.42* 1.33* 2.01* 2.53* 2.89* 3.20*  CALCIUM 7.0*   < > 7.6* 7.2* 7.3* 7.1* 7.4* 7.1*  MG 2.4  --  2.5*  --  2.5*  --  2.2 1.9  PHOS 2.5   < > 1.8* 4.4 2.9  2.9 2.7 2.7  --    < > = values in this interval not displayed.   CBC: Recent Labs  Lab 06/27/20 1237 06/27/20 1830 06/28/20 0535 06/29/20 0500 06/30/20 0430 07/01/20 0430 07/03/20 0500  WBC 13.2*   < > 11.4* 11.1* 9.3 7.9 4.0   NEUTROABS 11.7*  --   --   --   --   --   --   HGB 9.3*   < > 7.8* 7.3* 8.0* 7.5* 7.4*  HCT 28.5*   < > 21.9* 20.4* 23.1* 23.1* 23.4*  MCV 97.6   < > 88.3 87.2 90.2 93.9 97.5  PLT 234   < > 158 118* 103* 80* 99*   < > = values in this interval not displayed.      Medications:   . dextrose 5% lactated ringers 100 mL/hr at 07/03/20 1448  . meropenem (MERREM) IV Stopped (07/03/20 0558)   . (feeding supplement) PROSource Plus  30 mL Oral BID BM  . chlorhexidine  15 mL Mouth Rinse BID  . Chlorhexidine Gluconate Cloth  6 each Topical Daily  . DULoxetine  30 mg Oral BID  . feeding supplement  237 mL Oral BID BM  . fentaNYL  1 patch Transdermal Q72H  . gabapentin  300 mg Oral TID  . heparin  5,000 Units Subcutaneous Q8H  . mouth rinse  15 mL Mouth Rinse q12n4p  . pantoprazole  40 mg Oral Daily  . sodium chloride flush  10-40 mL Intracatheter Q12H   acetaminophen, HYDROmorphone (DILAUDID) injection, oxyCODONE, polyethylene glycol, sodium chloride flush    Kelly Splinter, MD 07/03/2020, 4:00 PM

## 2020-07-03 NOTE — Progress Notes (Signed)
Nutrition Follow-up  DOCUMENTATION CODES:   Severe malnutrition in context of chronic illness  INTERVENTION:  - will order Ensure Enlive BID, each supplement provides 350 kcal and 20 grams of protein. - will order 30 ml Prosource Plus BID, each supplement provides 100 kcal and 15 grams protein.  - will order 1 tablet multivitamin with minerals/day.  - diet advancement as medically feasible.   NUTRITION DIAGNOSIS:   Severe Malnutrition related to chronic illness (stage 4-5 CKD) as evidenced by severe fat depletion,severe muscle depletion. 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 On 3/29 he was found on the floor by his son; unknown amount of downtime. His soon reported a steady decline in patient's health and that he has difficulty with ADLs. [facility] to the [facility] and found to have profound metabolic acidosis and acute on chronic renal failure, and mild transaminitis. He was transferred to [facility].   Significant Events: 3/29- admission; R IJ HD cath placed; CRRT initiation 3/30- initial RD assessment 4/1- NGT placement; TF initiation; CRRT stopped 4/2- NGT removed; diet advanced to CLD at 1245 and to FLD at 1430   Patient discussed in rounds this AM. No intakes documented since diet advancement. Patient sitting in the chair  with provider at bedside; did not talk with patient at this time.   Estimated needs adjusted as patient remains off of CRRT since 4/1.   Weight has been fluctuating throughout hospitalization with weight today being +8 lb compared to admission weight. No information documented in the edema section of flow sheet.    Labs reviewed; CBGs: 106, 101, 86, 111 mg/dl, creatinine: 3.2 mg/dl, Ca: 7.1 mg/dl, GFR: 22 ml/min. Medications reviewed; 40 mg oral protonix/day. IVF; D5-LR @ 100 ml/hr (408 kcal/24 hours).    Diet Order:   Diet Order            Diet full liquid Room service appropriate? Yes; Fluid consistency: Thin  Diet effective now                 EDUCATION NEEDS:   Not appropriate for education at this time  Skin:  Skin Assessment: Skin Integrity Issues: Skin Integrity Issues:: Stage II Stage II: mid coccyx  Last BM:  4/3  Height:   Ht Readings from Last 1 Encounters:  06/27/20 5\' 6"  (1.676 m)    Weight:   Wt Readings from Last 1 Encounters:  07/03/20 62.3 kg    Ideal Body Weight:  64.54 kg  BMI:  Body mass index is 22.17 kg/m.  Estimated Nutritional Needs:   Kcal:  1870-2060 kcal  Protein:  95-110 grams  Fluid:  >/= 2 L/day      Jarome Matin, MS, RD, LDN, CNSC Inpatient Clinical Dietitian RD pager # available in [facility]  After hours/weekend pager # available in [facility]

## 2020-07-03 NOTE — Evaluation (Signed)
Physical Therapy Evaluation Patient Details Name: Chase [name] MRN: [id number] DOB: 07-09-1965 Today's Date: 07/03/2020   History of Present Illness  55 year old man with a complicated past medical history that includes remote Hodgkin's lymphoma (2010-11), gastric bypass with bleeding anastomotic ulcer and colovesicular fistula, colostomy. He was found 3/29 on the floor by his son, unclear downtime. At  [facility] ED on 3/29 found to have a profound metabolic acidosis with acute on chronic renal failure, elevated CK, relative hypotension, mild transaminitis. CVVHD started 3/29 with significant improvement in severe metabolic acidosis.   His son reported that overall his health has been in steady decline, has difficulty with ADLs.  Clinical Impression  The patient presents with significant debility. Patient resides alone.  Patient  Did mobilize to stand and pivot to recliner with mod steady assistance.  Patient  Reports that he plans to Return home, patient does not have 24/7 caregivers.  BP stable, HR 90's SPO2 100%  On RA.  Pt admitted with above diagnosis.   Pt currently with functional limitations due to the deficits listed below (see PT Problem List). Pt will benefit from skilled PT to increase their independence and safety with mobility to allow discharge to the venue listed below.     Follow Up Recommendations Home health PT;SNF    Equipment Recommendations  None recommended by PT    Recommendations for Other Services       Precautions / Restrictions Precautions Precautions: Fall Precaution Comments: colostomy      Mobility  Bed Mobility Overal bed mobility: Needs Assistance Bed Mobility: Supine to Sit     Supine to sit: Supervision     General bed mobility comments: patient got self to bed edge with rail still up.    Transfers Overall transfer level: Needs assistance   Transfers: Sit to/from Stand;Stand Pivot Transfers Sit to Stand: Mod assist Stand pivot transfers:  Mod assist       General transfer comment: patient very forward flexed/kyphotic. Support  as patient reached for armrests and pivoted to recliner.  Ambulation/Gait             General Gait Details: tba  Stairs            Wheelchair Mobility    Modified Rankin (Stroke Patients Only)       Balance Overall balance assessment: History of Falls;Needs assistance Sitting-balance support: Feet supported;Bilateral upper extremity supported Sitting balance-Leahy Scale: Fair     Standing balance support: During functional activity;Bilateral upper extremity supported Standing balance-Leahy Scale: Poor Standing balance comment: reliant on UE support                             Pertinent Vitals/Pain Pain Assessment: No/denies pain    Home Living Family/patient expects to be discharged to:: Unsure Living Arrangements: Alone;Children               Additional Comments: frequent falls at home, poor nutrition at home, son checks, has HHPT and OT    Prior Function Level of Independence: Needs assistance   Gait / Transfers Assistance Needed: rollator for ambulation, stated he drove for first time in [place]  ADL's / Homemaking Assistance Needed: sponge bathes        Hand Dominance        Extremity/Trunk Assessment   Upper Extremity Assessment Upper Extremity Assessment: Generalized weakness    Lower Extremity Assessment Lower Extremity Assessment: Generalized weakness    Cervical /  Trunk Assessment Cervical / Trunk Assessment: Kyphotic (severe)  Communication      Cognition Arousal/Alertness: Awake/alert Behavior During Therapy: WFL for tasks assessed/performed Overall Cognitive Status: No family/caregiver present to determine baseline cognitive functioning Area of Impairment: Orientation                 Orientation Level: Time;Situation             General Comments: states reason for here based on information given  to him, he states he does not remember events.      General Comments      Exercises General Exercises - Lower Extremity Long Arc Quad: AROM;Both;10 reps;Seated Other Exercises Other Exercises: blue TB UE's for shoulder abd, elb flex, elb ext,  x 10   Assessment/Plan    PT Assessment Patient needs continued PT services  PT Problem List Decreased strength;Decreased mobility;Decreased safety awareness;Decreased activity tolerance;Decreased balance;Decreased knowledge of use of DME;Pain       PT Treatment Interventions DME instruction;Therapeutic activities;Cognitive remediation;Gait training;Therapeutic exercise;Patient/family education;Functional mobility training    PT Goals (Current goals can be found in the Care Plan section)       Frequency Min 3X/week   Barriers to discharge Decreased caregiver support      Co-evaluation               AM-PAC PT "6 Clicks" Mobility  Outcome Measure Help needed turning from your back to your side while in a flat bed without using bedrails?: A Little Help needed moving from lying on your back to sitting on the side of a flat bed without using bedrails?: A Little Help needed moving to and from a bed to a chair (including a wheelchair)?: A Lot Help needed standing up from a chair using your arms (e.g., wheelchair or bedside chair)?: A Lot Help needed to walk in hospital room?: A Lot Help needed climbing 3-5 steps with a railing? : Total 6 Click Score: 13    End of Session   Activity Tolerance: Patient tolerated treatment well Patient left: in chair;with call bell/phone within reach;with chair alarm set Nurse Communication: Mobility status PT Visit Diagnosis: Muscle weakness (generalized) (M62.81)    Time: 4098-1191 PT Time Calculation (min) (ACUTE ONLY): 45 min   Charges:   PT Evaluation $PT Eval Moderate Complexity: 1 Mod PT Treatments $Therapeutic Activity: 23-37 mins        Tresa Endo PT Acute Rehabilitation  Services Pager [phone] Office [phone]   [name] 07/03/2020, 1:22 PM

## 2020-07-03 NOTE — Progress Notes (Signed)
Patient ID: Chase [name], male   DOB: 1965/12/14, 55 y.o.   MRN: [id number]    Subjective: Pt now transferred to the floor.  Feeling good today.  Delirium resolved.  Now off CRRT for a couple of days.  Objective: Vital signs in last 24 hours: Temp:  [99 F (37.2 C)-100.22 F (37.9 C)] 99.14 F (37.3 C) (04/04 1100) Pulse Rate:  [55-74] 71 (04/04 1300) Resp:  [10-18] 14 (04/04 1300) BP: (96-155)/(20-107) 134/83 (04/04 1300) SpO2:  [94 %-100 %] 99 % (04/04 1300) Weight:  [62.3 kg] 62.3 kg (04/04 0400)  Intake/Output from previous day: 04/03 0701 - 04/04 0700 In: 3185.1 [P.O.:660; I.V.:2325; IV Piggyback:200.1] Out: 3025 [Urine:3000; Stool:25] Intake/Output this shift: Total I/O In: -  Out: 950 [Urine:950]  Physical Exam:  General: Alert and oriented GU: Foley in place with clear urine  Lab Results: Recent Labs    07/01/20 0430 07/03/20 0500  HGB 7.5* 7.4*  HCT 23.1* 23.4*   BMET Recent Labs    07/02/20 0500 07/03/20 0500  NA 138 135  K 4.1 3.9  CL 107 106  CO2 25 24  GLUCOSE 111* 99  BUN 18 20  CREATININE 2.89* 3.20*  CALCIUM 7.4* 7.1*     Studies/Results: No results found.  Assessment/Plan: 1) Bilateral hydronephrosis:  He has chronic indwelling stents last changed 2.5 months ago.  Considering recent acute illness, will likely need to simply change his ureteral stents after about 3 months.  Since patient is now in the hospital and improved, could change his stents later this week for his convenience (possibly Thursday) or can arrange as an outpatient.  2) Urinary retention: He does have evidence of detrusor function on recent urodynamics and appears to have bladder outlet obstruction most likely from BPH.  Discussed result and will be a candidate for restarting alpha blocker therapy and voiding trial once stabilization of renal function occurs.  Pt now appears willing to consider hemodialysis chronically if needed. Continue urethral catheter for  now.    LOS: 6 days   Chase [name] 07/03/2020, 2:49 PM

## 2020-07-03 NOTE — Progress Notes (Incomplete)
NAME:  Chase [name], MRN:  [id number], DOB:  31-Mar-1966, LOS: 6 ADMISSION DATE:  06/27/2020, CONSULTATION DATE: 06/27/2020 REFERRING MD: Dr. 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 Of note, patient was recently admitted January/February 2022 with acute-on-chronic renal failure 2/2 obstructive nephropathy post-nephrostomy tube removal.  Internal ureteral stents were replaced at that time and a diverting loop colostomy was performed for colovesicular fistula management.  Patient was found 3/29 on the floor by his son with unclear downtime.  Last seen well midday 3/28 when he was evaluated by EMS after a fall with concern for narcotic overdose. At time of admission, son reported that patient's overall health has been steadily declining and that he has difficulty with ADLs.  Patient was brought to [facility] ED 3/29 and found to have a profound metabolic acidosis with acute-on-chronic renal failure, elevated CK, relative hypotension, mild transaminitis.  He was started on IV bicarbonate, IV fluid resuscitation, received empiric ceftriaxone and vancomycin for presumed sepsis. Antibiotics were broadened to meropenem on 3/30.  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 UDS 3/29 >> negative . CT abdomen/pelvis 3/29 >> bilateral renal atrophy, bilateral ureteral stents are noted in good position without any hydronephrosis.  Foley catheter present.  History gastric surgery with left lower quadrant colostomy. . Renal ultrasound 3/29 >> stable moderate bilateral hydroureteronephrosis, right ureteral stent extending to the renal pelvis, left ureteral stent faintly visualized.  Echogenic focus in the left kidney question nonobstructing stone or air bubble . CT pelvis/left hip 3/29 >> no evidence of hip fracture or dislocation normal joint space, decreased offset of the bilateral femoral head neck junctions without suspicious bony lesion . Chest x-ray 3/29 >> lungs clear, no infiltrates . Head CT 3/29 >> no acute intracranial abnormality, old right occipital lobe infarct . Blood cultures 3/29 ([facility]) >> neg  . CVVHD started 3/29 with significant improvement in severe metabolic acidosis . Developed shock 3/29 when CVVH started, on norepinephrine . NG tube attempted 3/30, unsuccessful . Echocardiogram 3/30 >> LVEF 60-65% without regional wall motion abnormality, no diastolic dysfunction.  Normal RV size and function, normal PASP.  No mention of valvular abnormality; ceftriaxone broadened to meropenem . 4/1 d/c'd cvvh . Physical therapy/ mobilization started  . AM Cortisol 4/4 >>  Interim History / Subjective:  Much more alert/ conversant, advancing diet   Objective:  Blood pressure 139/65, pulse (!) 55, temperature 99 F (37.2 C), resp. rate 12, height 5\' 6"  (1.676 m), weight 62.3 kg, SpO2 100 %. CVP:  [0 mmHg] 0 mmHg      Intake/Output Summary (Last 24 hours) at 07/03/2020 0729 Last data filed at 07/03/2020 0640 Gross per 24 hour  Intake 3185.1 ml  Output 3025 ml  Net 160.1 ml   Filed Weights   07/01/20 0435  07/02/20  0500 07/03/20 0400  Weight: 60.6 kg 62.9 kg 62.3 kg   CVP:  [0 mmHg] 0 mmHg   Physical Examination: General: {SRACUITY:25313} ill-appearing *** in NAD. HEENT: [place]/AT, anicteric sclera, PERRL, moist mucous membranes. Neuro: {SRLOC:25308} {SRSTIMULI:25309} {SRCOMMANDS:25310} {SRNEUROEXTREMITIES:25312} Strength ***/5 in *** extremities. {SRRBRAINSTEM:25311}  CV: RRR, no m/g/r. PULM: Breathing even and unlabored on ***. Lung fields ***. GI: Soft, nontender, nondistended. Normoactive bowel sounds. Extremities: *** LE edema noted. Skin: Warm/dry, ***.  Resolved Hospital Problem List:  Severe anion gap metabolic acidosis with normal lactate, EtOH negative.  Resolved.  Presumed due to the above.  Volatile alcohol screen picked up acetone, no others. Probable rhabdomyolysis; CK down to 1233 on 3/30 Shock, presumed hypovolemic, possibly septic.  Echocardiogram 3/30 was normal  Assessment & Plan:  Acute on chronic (stage IV-V) renal failure.  Long history of ureteral obstruction but current CT abdomen did not show hydronephrosis.  Question due to hypovolemia, rhabdomyolysis, question sepsis.  Hypokalemia and hypomagnesemia on CVVHD- d/c'd 4/1 -Urology reviewed his CT abdomen and there was no clear role for percutaneous nephrostomy tubes - Stress dose steroids discontinued 3/31  .    - UOP continues quite high and cvp low so will check cortisol level 4/4 am    Acute respiratory failure.  Principally due to airway protection and secretions/ improved     - Now on RA/ NCB > started to advance as tol  4/2 and doing fine so far   Septic shock with low bp and high prolactin but no def source though urine not obtained  -Blood cultures from [facility] negative so far.  Antibiotics day 6: Broadened to vancomycin and meropenem on 3/30.  Stopped vancomycin 4/1, continue total antibiotics at least 7 days  Acute toxic metabolic encephalopathy.  Due to his metabolic disarray, renal failure.    UDS is  negative.  Volatile alcohols positive acetone, unclear significance.  Suspect also components of narcotics withdrawal, gabapentin withdrawal >>> precedex working on titrating completely of am 4/3   Chronic pain  -  Restarted cymbalta am 4/3    CAD -Home aspirin and Plavix, statin are all on hold.  Plan to restart when stable from acute events  Mild transaminitis.  Question shock liver.  Hepatitis panel negative -recheck lfts am 4/4  Diverting colostomy, history of colovesicular fistula -Standard colostomy care  Protein calorie malnutrition -clogged ft removed am 4/2  > push po nutrition   Anemia of chronic disease/critical illness - Trend H&H - Transfuse for Hgb < 7   Best practice (right click and "Reselect all SmartList Selections" daily)  Diet:  Oral Pain/Anxiety/Delirium protocol (if indicated): Yes (RASS goal 0) VAP protocol (if indicated): Not indicated DVT prophylaxis: Subcutaneous Heparin and Systemic AC GI prophylaxis: PPI Glucose control:  SSI No Central venous access:  Yes, and it is still needed Arterial line:  N/A Foley:  Yes, and it is still needed Mobility:  OOB as tol  PT consulted: Yes Last date of multidisciplinary goals of care discussion [06/29/20] Code Status:  DNR Disposition: ICU as long as needs precedex   Critical care time: ***   Rhae Lerner [facility] 07/03/20 7:29 AM  Please see [email or web address] for pager details.

## 2020-07-03 NOTE — Progress Notes (Signed)
NAME:  Chase [name], MRN:  [id number], DOB:  Jun 15, 1965, LOS: 6 ADMISSION DATE:  06/27/2020, CONSULTATION DATE: 06/27/2020 REFERRING MD: Dr. 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  He was admitted in January/February 2022 with acute on chronic renal failure related to obstructive nephropathy after his nephrostomy tubes were pulled.  He had internal ureteral stents replaced at that time.  A diverting loop colostomy was also performed for the fistula. He is on high-dose Neurontin and narcotics for chronic pain history of PTSD  He was found 3/29 on the floor by his son, unclear downtime.  Last seen well midday 3/28 when he was evaluated by EMS after a fall, question narcotics overdose.  He was not brought to the ED at that time.  His son reported that overall his health has been in steady decline, has difficulty with ADLs.  In the [facility] ED on 3/29 found to have a profound metabolic acidosis with acute on chronic renal failure, elevated CK, relative hypotension, mild transaminitis.  He was started on IV bicarbonate, IV fluid resuscitation, received empiric ceftriaxone.    Pertinent  Medical History   Past Medical History:  Diagnosis Date  . Cervical radiculopathy    with left sided weakness  . Chemical exposure    service related injury  . Chronic pain   . CKD (chronic kidney disease), stage IV ([place]) 10/17/2019  . COVID-19   . GERD (gastroesophageal reflux disease)   . GIB (gastrointestinal bleeding)   . Heart attack ([place])   . History of  CVA (cerebrovascular accident) 12/31/2019  . 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 History of stomach ulcers 05/19/2013   Formatting of this note might be different from the original. Attributed to chronic ibuprofen use  . Hodgkin lymphoma, unspecified, unspecified site ([place]) 12/10/2012   Formatting of this note might be different from the original. In remission.  . Hypokalemia   . Hypomagnesemia   . PTSD (post-traumatic stress disorder)   . Renal disorder    CKD  . Stroke ([place])   . Urinary retention    due to bladder outlet obstruction    Significant Hospital Events: Including procedures, antibiotic start and stop dates in addition to other pertinent events   . UDS 3/29 >> negative . CT abdomen/pelvis 3/29 >> bilateral renal atrophy, bilateral ureteral stents are noted in good position without any hydronephrosis.  Foley catheter present.  History gastric surgery with left lower quadrant colostomy. . Renal ultrasound 3/29 >> stable moderate bilateral hydroureteronephrosis, right ureteral stent extending to the renal pelvis, left ureteral stent faintly visualized.  Echogenic focus in the left kidney question nonobstructing stone or air bubble . CT pelvis/left hip 3/29 >> no evidence of hip fracture or dislocation normal joint space, decreased offset of the bilateral femoral head neck junctions without suspicious bony lesion . Chest x-ray 3/29 >> lungs clear, no infiltrates . Head CT 3/29 >> no acute intracranial abnormality, old right occipital lobe infarct . Blood cultures 3/29 ([facility]) >> neg  . CVVHD started 3/29 with significant improvement in severe metabolic acidosis . Developed shock 3/29 when CVVH started, on norepinephrine .  NG tube attempted 3/30, unsuccessful . Echocardiogram 3/30 >> LVEF 60-65% without regional wall motion abnormality, no diastolic dysfunction.  Normal RV size and function, normal PASP.  No mention of valvular abnormality . 4/1 d/c'd cvvh . Physical therapy/  mobilization started  . AM Cortisol 4/4 >>>   Scheduled Meds: . chlorhexidine  15 mL Mouth Rinse BID  . Chlorhexidine Gluconate Cloth  6 each Topical Daily  . DULoxetine  30 mg Oral BID  . fentaNYL  1 patch Transdermal Q72H  . gabapentin  300 mg Oral TID  . heparin  5,000 Units Subcutaneous Q8H  . mouth rinse  15 mL Mouth Rinse q12n4p  . pantoprazole  40 mg Oral Daily  . sodium chloride flush  10-40 mL Intracatheter Q12H   Continuous Infusions: . dextrose 5% lactated ringers 100 mL/hr at 07/03/20 0640  . meropenem (MERREM) IV Stopped (07/03/20 0558)   PRN Meds:.acetaminophen, HYDROmorphone (DILAUDID) injection, oxyCODONE, polyethylene glycol, sodium chloride flush   Interim History / Subjective:  No overnight events Alert and interactive Feels he is progressing enough to want to go home  Objective   Blood pressure 139/65, pulse (!) 55, temperature 99 F (37.2 C), resp. rate 12, height 5\' 6"  (1.676 m), weight 62.3 kg, SpO2 100 %. CVP:  [0 mmHg] 0 mmHg      Intake/Output Summary (Last 24 hours) at 07/03/2020 0748 Last data filed at 07/03/2020 0640 Gross per 24 hour  Intake 3185.1 ml  Output 3025 ml  Net 160.1 ml   Filed Weights   07/01/20 0435 07/02/20 0500 07/03/20 0400  Weight: 60.6 kg 62.9 kg 62.3 kg   CVP:  [0 mmHg] 0 mmHg   Examination: Alert and interactive Moist oral mucosa Neck is supple, no JVD Chest equal movement with mild rhonchi S1-S2 appreciated with no murmur Abdomen is soft bowel sounds normoactive Extremities is warm and dry Moving all extremities  Resolved Hospital Problem list   Severe anion gap metabolic acidosis with normal lactate, EtOH negative.  Resolved.  Presumed due to the above.  Volatile alcohol screen picked up acetone, no others. Probable rhabdomyolysis; CK down to 1233 on 3/30 Shock, presumed hypovolemic, possibly septic.  Echocardiogram 3/30 was normal  Assessment & Plan:  Acute on chronic renal failure -Longstanding history of  ureteral obstruction, no hydronephrosis on current CT -CVVHD held on 4/1, mild upward trending creatinine -Maintaining good urine output -Appreciate renal input -Continue to monitor, HD catheter still in place  Acute respiratory failure -Remains on room air -Stable  Septic shock -Was on vancomycin and meropenem, -Plan is to complete 7 days of meropenem -Blood cultures 7/37-TGGYIRSW to date  Metabolic encephalopathy -Appears to be resolving  Chronic pain -On opiates-fentanyl patch -Cymbalta restarted 4/3 -Neurontin  Coronary artery disease -Was on aspirin, Plavix, statin-all on hold at present  Mild transaminitis -Resolving  History of diverting colostomy Colovesical fistula -Continue colostomy care  Protein calorie malnutrition -Taking orally  Anemia of chronic illness -Continue to trend  Profound deconditioning -Physical therapy consulted  Best practice (right click and "Reselect all SmartList Selections" daily)  Diet:  Oral Pain/Anxiety/Delirium protocol (if indicated): Yes (RASS goal 0) VAP protocol (if indicated): Not indicated DVT prophylaxis: Subcutaneous Heparin and Systemic AC GI prophylaxis: PPI Glucose control:  SSI No Central venous access:  Yes, and it is still needed Arterial line:  N/A Foley:  Yes, and it is still needed Mobility:  OOB as tol  PT consulted: Yes Last date of multidisciplinary goals of care discussion [06/29/20]  Code Status:  DNR Disposition: ICU as long as needs precedex   Labs   CBC: Recent Labs  Lab 06/27/20 1237 06/27/20 1830 06/28/20 0535 06/29/20 0500 06/30/20 0430 07/01/20 0430 07/03/20 0500  WBC 13.2*   < > 11.4* 11.1* 9.3 7.9 4.0  NEUTROABS 11.7*  --   --   --   --   --   --   HGB 9.3*   < > 7.8* 7.3* 8.0* 7.5* 7.4*  HCT 28.5*   < > 21.9* 20.4* 23.1* 23.1* 23.4*  MCV 97.6   < > 88.3 87.2 90.2 93.9 97.5  PLT 234   < > 158 118* 103* 80* 99*   < > = values in this interval not displayed.    Basic Metabolic  Panel: Recent Labs  Lab 06/29/20 0500 06/29/20 1550 06/30/20 0430 06/30/20 1615 07/01/20 0430 07/01/20 2034 07/02/20 0500 07/03/20 0500  NA 136   < > 139 137 140 138 138 135  K 3.6   < > 3.4* 4.8 3.3* 4.0 4.1 3.9  CL 102   < > 108 105 108 107 107 106  CO2 20*   < > 21* 23 23 25 25 24   GLUCOSE 92   < > 89 177* 91 110* 111* 99  BUN 29*   < > 16 11 14 17 18 20   CREATININE 2.17*   < > 1.42* 1.33* 2.01* 2.53* 2.89* 3.20*  CALCIUM 7.0*   < > 7.6* 7.2* 7.3* 7.1* 7.4* 7.1*  MG 2.4  --  2.5*  --  2.5*  --  2.2 1.9  PHOS 2.5   < > 1.8* 4.4 2.9  2.9 2.7 2.7  --    < > = values in this interval not displayed.   GFR: Estimated Creatinine Clearance: 22.7 mL/min (A) (by C-G formula based on SCr of 3.2 mg/dL (H)). Recent Labs  Lab 06/27/20 1238 06/27/20 1830 06/28/20 0430 06/28/20 1049 06/29/20 0500 06/30/20 0430 07/01/20 0430 07/03/20 0500  PROCALCITON  --   --   --  31.73 12.88 4.17  --   --   WBC  --  5.5   < >  --  11.1* 9.3 7.9 4.0  LATICACIDVEN 0.9 0.6  --   --  0.9  --   --   --    < > = values in this interval not displayed.    Liver Function Tests: Recent Labs  Lab 06/27/20 1237 06/27/20 1830 06/28/20 0535 06/30/20 0430 06/30/20 1615 07/01/20 0430 07/01/20 2034 07/02/20 0500 07/03/20 0500  AST 77* 84*  --  80*  --   --   --   --  22  ALT 84* 77*  --  63*  --   --   --   --  27  ALKPHOS 178* 166*  --  108  --   --   --   --  85  BILITOT 1.1 1.1  --  1.3*  --   --   --   --  0.4  PROT 6.6 6.0*  --  5.0*  --   --   --   --  4.3*  ALBUMIN 2.9* 2.6*   < > 2.1*  2.0* 2.0* 1.9* 1.8* 1.8* 1.7*   < > = values in this interval not displayed.   No results for input(s): LIPASE, AMYLASE in the last 168 hours. No results for input(s): AMMONIA in the last 168 hours.  ABG    Component Value Date/Time  PHART 7.330 (L) 06/28/2020 0504   PCO2ART 25.7 (L) 06/28/2020 0504   PO2ART 102 06/28/2020 0504   HCO3 13.1 (L) 06/28/2020 0504   TCO2 23 04/18/2020 1205    ACIDBASEDEF 11.3 (H) 06/28/2020 0504   O2SAT 97.9 06/28/2020 0504     Coagulation Profile: No results for input(s): INR, PROTIME in the last 168 hours.  Cardiac Enzymes: Recent Labs  Lab 06/27/20 1237 06/28/20 0535  CKTOTAL 2,210* 1,233*    HbA1C: Hgb A1c MFr Bld  Date/Time Value Ref Range Status  10/18/2019 12:13 AM 5.4 4.8 - 5.6 % Final    Comment:    (NOTE) Pre diabetes:          5.7%-6.4%  Diabetes:              >6.4%  Glycemic control for   <7.0% adults with diabetes     CBG: Recent Labs  Lab 07/02/20 1604 07/02/20 1939 07/03/20 0042 07/03/20 0433 07/03/20 0727  GLUCAP 123* 107* 106* 101* 86   The patient is critically ill with multiple organ systems failure and requires high complexity decision making for assessment and support, frequent evaluation and titration of therapies, application of advanced monitoring technologies and extensive interpretation of multiple databases. Critical Care Time devoted to patient care services described in this note independent of APP/resident time (if applicable)  is 32 minutes.   Sherrilyn Rist MD [facility] Personal pager: [phone] If unanswered, please page CCM On-call: [phone]

## 2020-07-04 DIAGNOSIS — R338 Other retention of urine: Secondary | ICD-10-CM

## 2020-07-04 DIAGNOSIS — L89302 Pressure ulcer of unspecified buttock, stage 2: Secondary | ICD-10-CM

## 2020-07-04 DIAGNOSIS — N189 Chronic kidney disease, unspecified: Secondary | ICD-10-CM

## 2020-07-04 DIAGNOSIS — R6520 Severe sepsis without septic shock: Secondary | ICD-10-CM

## 2020-07-04 DIAGNOSIS — A419 Sepsis, unspecified organism: Principal | ICD-10-CM

## 2020-07-04 DIAGNOSIS — N179 Acute kidney failure, unspecified: Secondary | ICD-10-CM

## 2020-07-04 DIAGNOSIS — R579 Shock, unspecified: Secondary | ICD-10-CM | POA: Diagnosis not present

## 2020-07-04 DIAGNOSIS — D638 Anemia in other chronic diseases classified elsewhere: Secondary | ICD-10-CM

## 2020-07-04 DIAGNOSIS — G9341 Metabolic encephalopathy: Secondary | ICD-10-CM

## 2020-07-04 DIAGNOSIS — E872 Acidosis: Secondary | ICD-10-CM | POA: Diagnosis not present

## 2020-07-04 DIAGNOSIS — N1330 Unspecified hydronephrosis: Secondary | ICD-10-CM

## 2020-07-04 LAB — CBC WITH DIFFERENTIAL/PLATELET
Abs Immature Granulocytes: 0.07 10*3/uL (ref 0.00–0.07)
Basophils Absolute: 0 10*3/uL (ref 0.0–0.1)
Basophils Relative: 1 %
Eosinophils Absolute: 0.3 10*3/uL (ref 0.0–0.5)
Eosinophils Relative: 8 %
HCT: 23.5 % — ABNORMAL LOW (ref 39.0–52.0)
Hemoglobin: 7.3 g/dL — ABNORMAL LOW (ref 13.0–17.0)
Immature Granulocytes: 2 %
Lymphocytes Relative: 26 %
Lymphs Abs: 1 10*3/uL (ref 0.7–4.0)
MCH: 30.8 pg (ref 26.0–34.0)
MCHC: 31.1 g/dL (ref 30.0–36.0)
MCV: 99.2 fL (ref 80.0–100.0)
Monocytes Absolute: 0.5 10*3/uL (ref 0.1–1.0)
Monocytes Relative: 14 %
Neutro Abs: 1.8 10*3/uL (ref 1.7–7.7)
Neutrophils Relative %: 49 %
Platelets: 130 10*3/uL — ABNORMAL LOW (ref 150–400)
RBC: 2.37 MIL/uL — ABNORMAL LOW (ref 4.22–5.81)
RDW: 16.2 % — ABNORMAL HIGH (ref 11.5–15.5)
WBC: 3.7 10*3/uL — ABNORMAL LOW (ref 4.0–10.5)
nRBC: 0 % (ref 0.0–0.2)

## 2020-07-04 LAB — RENAL FUNCTION PANEL
Albumin: 1.8 g/dL — ABNORMAL LOW (ref 3.5–5.0)
Anion gap: 5 (ref 5–15)
BUN: 22 mg/dL — ABNORMAL HIGH (ref 6–20)
CO2: 23 mmol/L (ref 22–32)
Calcium: 7 mg/dL — ABNORMAL LOW (ref 8.9–10.3)
Chloride: 111 mmol/L (ref 98–111)
Creatinine, Ser: 3.69 mg/dL — ABNORMAL HIGH (ref 0.61–1.24)
GFR, Estimated: 18 mL/min — ABNORMAL LOW (ref >60)
Glucose, Bld: 97 mg/dL (ref 70–99)
Phosphorus: 3.5 mg/dL (ref 2.5–4.6)
Potassium: 3.6 mmol/L (ref 3.5–5.1)
Sodium: 139 mmol/L (ref 135–145)

## 2020-07-04 LAB — FERRITIN: Ferritin: 284 ng/mL (ref 24–336)

## 2020-07-04 LAB — IRON AND TIBC
Iron: 83 ug/dL (ref 45–182)
Saturation Ratios: 66 % — ABNORMAL HIGH (ref 17.9–39.5)
TIBC: 125 ug/dL — ABNORMAL LOW (ref 250–450)
UIBC: 42 ug/dL

## 2020-07-04 LAB — RETICULOCYTES
Immature Retic Fract: 16.6 % — ABNORMAL HIGH (ref 2.3–15.9)
RBC.: 2.37 MIL/uL — ABNORMAL LOW (ref 4.22–5.81)
Retic Count, Absolute: 46.2 10*3/uL (ref 19.0–186.0)
Retic Ct Pct: 2 % (ref 0.4–3.1)

## 2020-07-04 LAB — FOLATE: Folate: 4 ng/mL — ABNORMAL LOW (ref >5.9)

## 2020-07-04 LAB — MAGNESIUM: Magnesium: 1.8 mg/dL (ref 1.7–2.4)

## 2020-07-04 LAB — VITAMIN B12: Vitamin B-12: 619 pg/mL (ref 180–914)

## 2020-07-04 MED ORDER — FOLIC ACID 1 MG PO TABS
1.0000 mg | ORAL_TABLET | Freq: Every day | ORAL | Status: DC
  Administered 2020-07-04 – 2020-07-07 (×4): 1 mg via ORAL
  Filled 2020-07-04 (×4): qty 1

## 2020-07-04 NOTE — TOC Progression Note (Signed)
Transition of Care [facility]) - Progression Note    Patient Details  Name: Chase [name] MRN: [id number] Date of Birth: [birth year]  Transition of Care [facility]) CM/SW Contact  Sulaiman Imbert, Marjie Skiff, RN Phone Number: 07/04/2020, 12:10 PM  Clinical Narrative:    Spoke with pt at bedside for dc planning. Pt states that he has been to SNF before and he will not be going back. Pt states that he usually walks around with a walker at home and he has home health services from [facility]. He would like to continue services with them at DC. [facility] liaison contacted to confirm. Pt states that his mom might be coming to stay with him for awhile after his DC from hospital. He states [facility].   Expected Discharge Plan: [place] Barriers to Discharge: Continued Medical Work up  Expected Discharge Plan and Services Expected Discharge Plan: [place]   Discharge Planning Services: CM Consult Post Acute Care Choice: [place] arrangements for the past 2 months: [place]: RN,PT,OT [place] Agency: [facility] (formerly [employer]) Date [place]: 07/04/20 Time [place]: 1204 Representative spoke with at [place]: [place] ([place]) Interventions    Readmission Risk Interventions Readmission Risk Prevention Plan 07/04/2020 12/25/2019 11/19/2019  Transportation Screening Complete Complete Complete  Medication Review Press photographer) Complete Complete Complete  PCP or Specialist appointment within 3-5 days of discharge Complete - Complete  HRI or Home Care Consult Complete - Complete  SW Recovery Care/Counseling Consult Complete - Complete  Palliative Care Screening Not Applicable - Not Applicable  Skilled Nursing Facility Not Applicable - Complete  Some recent data might be hidden

## 2020-07-04 NOTE — Progress Notes (Addendum)
PROGRESS NOTE    Chase [name]  ZOX:[id number] DOB: [birth year] DOA: 06/27/2020 PCP: [name]    Chief Complaint  Patient presents with  . 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  He was admitted in January/February 2022 with acute on chronic renal failure related to obstructive nephropathy after his nephrostomy tubes were pulled.  He had internal ureteral stents replaced at that time.  A diverting loop colostomy was also performed for the fistula. He is on high-dose Neurontin and narcotics for chronic pain history of PTSD  He was found 3/29 on the floor by his son, unclear downtime.  Last seen well midday 3/28 when he was evaluated by EMS after a fall, question narcotics overdose.  He was not brought to the ED at that time.  His son reported that overall his health has been in steady decline, has difficulty with his ADL.  In the [facility] ED on 3/29 found to have a profound metabolic acidosis with acute on chronic renal failure, elevated CK, relative hypotension, mild transaminitis.  He was started on IV bicarbonate, IV fluid resuscitation, received empiric ceftriaxone.    Assessment & Plan:   Active Problems:   Shock ([place])   Kidney failure   Pressure injury of skin   Delirium   Sepsis with acute renal failure (HCC)   Anemia of chronic disease   Acute urinary retention   Bilateral hydronephrosis   Acute metabolic encephalopathy   Acute kidney injury superimposed on chronic kidney disease (HCC)  1 severe anion gap metabolic  acidosis -Likely secondary to acute kidney injury on chronic kidney disease stage V. -Status post CRRT therapy (3/29-4/1). -Resolved.  2.  Acute on chronic kidney disease stage V with history of urinary obstruction post bilateral nephrostomy (04/2020) -Likely multifactorial leading to acute kidney injury/ATN from sepsis and possible rhabdomyolysis superimposed. -Status post CRRT therapy (3/29-4/1). -It is noted that patient with prior history of acute kidney injury with temporary HD. -Nephrology following and feel at this time patient may be considered as a long-term HD candidate if needed. -Per nephrology.  3.  Acute metabolic encephalopathy -Likely secondary to problem #1, 2 and septic shock. -Currently improved. -Patient currently at baseline.  Resolved.  4.  Septic shock -??  Etiology.  Patient with component of hypovolemia likely also leading to shock. -2D echo obtained with normal EF,NWMA. -Patient was on stress dose steroids which have subsequently been discontinued. -Blood cultures with no growth to date. -Was empirically on IV vancomycin and Merrem. -IV vancomycin discontinued. -Continue Merrem and discontinue after today's doses to complete a 7-day course.  5.  Chronic pain -On opiate/fentanyl patch. -Continue Cymbalta, Neurontin.  6.  Bilateral hydronephrosis  -Patient with chronic indwelling stents last changed about 2-1/2 months ago.  Patient being followed by urology. -Urology recommending change of stents later this week possibly on Thursday versus outpatient setting. -Per urology.  7.  Urinary retention -Continue urethral catheter for now.   -Per urology.  8.  Mild transaminitis -Likely secondary to septic shock. -Improving.  9.  History of diverting colostomy/colovesicular fistula -Continue colostomy care.  10.  Moderate protein calorie malnutrition -Continue  nutritional supplementation.  11.  Coronary artery disease -Aspirin, Plavix, statin on hold.   -Likely resume medications in the next 24 to 48 hours  12.  Anemia of chronic disease/folate deficiency -Hemoglobin currently stable at 7.3. -Start folic acid 1 mg daily. -Transfusion threshold hemoglobin < 7.  13.  Profound deconditioning -PT/OT.  14. Pressure injury, POA Pressure Injury 11/08/19 Coccyx Upper Stage 2 -  Partial thickness loss of dermis presenting as a shallow open injury with a red, pink wound bed without slough. unblanchable with an open slit in the middle non draining  (Active)  11/08/19 0300  Location: Coccyx  Location Orientation: Upper  Staging: Stage 2 -  Partial thickness loss of dermis presenting as a shallow open injury with a red, pink wound bed without slough.  Wound Description (Comments): unblanchable with an open slit in the middle non draining   Present on Admission: Yes     Pressure Injury 06/27/20 Coccyx Mid Stage 2 -  Partial thickness loss of dermis presenting as a shallow open injury with a red, pink wound bed without slough. pink, red (Active)  06/27/20 1800  Location: Coccyx  Location Orientation: Mid  Staging: Stage 2 -  Partial thickness loss of dermis presenting as a shallow open injury with a red, pink wound bed without slough.  Wound Description (Comments): pink, red  Present on Admission: Yes         DVT prophylaxis: Heparin Code Status: Full Family Communication: Updated patient.  No family at bedside. Disposition:   Status is: Inpatient    Dispo: The patient is from: Home              Anticipated d/c is to: Home with home health versus SNF              Patient currently not stable for discharge   Difficult to place patient determined       Consultants:   Urology: Dr. Gloriann Loan 06/27/2020  Nephrology: Dr. Augustin Coupe 06/27/2020  Procedures:   CT head 06/27/2020  CT abdomen pelvis 06/27/2020  Chest x-ray 06/27/2020, 06/29/2020  Abdominal films 06/29/2020, 06/30/2020, 07/01/2020  Renal ultrasound 06/27/2020  2D echo  06/28/2020  Significant Hospital Events: Including procedures, antibiotic start and stop dates in addition to other pertinent events    UDS 3/29 >> negative  CT abdomen/pelvis 3/29 >> bilateral renal atrophy, bilateral ureteral stents are noted in good position without any hydronephrosis.  Foley catheter present.  History gastric surgery with left lower quadrant colostomy.  Renal ultrasound 3/29 >> stable moderate bilateral hydroureteronephrosis, right ureteral stent extending to the renal pelvis, left ureteral stent faintly visualized.  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  Normal RV size and function, normal PASP.  No mention of valvular abnormality  4/1 d/c'd cvvh  Physical therapy/ mobilization started   AM Cortisol 4/4 >>>     Antimicrobials:  IV Rocephin 06/27/2020>>>> 06/28/2020  IV Merrem 06/28/2020>>>>> 07/05/2020  IV vancomycin 06/28/2020>>>> 06/30/2020   Subjective: Patient sitting up in chair.  Alert and oriented.  Denies any chest pain.  No shortness of breath.  No abdominal pain.  No nausea or emesis.  Tolerating current diet.  States had good urine output.  Objective: Vitals:   07/03/20 2217 07/04/20 0620 07/04/20 0819 07/04/20 1503  BP: 122/65 119/70  111/70  Pulse: 64 64  79  Resp: 16 17     Temp: 98.9 F (37.2 C) 99 F (37.2 C)  97.6 F (36.4 C)  TempSrc: Oral Oral  Oral  SpO2: 100% 98%  100%  Weight:   63.3 kg   Height:        Intake/Output Summary (Last 24 hours) at 07/04/2020 1610 Last data filed at 07/04/2020 0945 Gross per 24 hour  Intake 3242.24 ml  Output 2350 ml  Net 892.24 ml   Filed Weights   07/02/20 0500 07/03/20 0400 07/04/20 0819  Weight: 62.9 kg 62.3 kg 63.3 kg    Examination:  General exam: Appears calm and comfortable  Respiratory system: Clear to auscultation. Respiratory effort normal. Cardiovascular system: S1 & S2 heard, RRR. No JVD, murmurs, rubs, gallops or clicks. No pedal edema. Gastrointestinal system: Abdomen is nondistended, soft and nontender. No organomegaly or masses felt. Normal bowel sounds heard.  Ostomy bag with loose stool noted. Central nervous system: Alert and oriented. No focal neurological deficits. Extremities: Symmetric 5 x 5 power. Skin: No rashes, lesions or ulcers Psychiatry: Judgement and insight appear normal. Mood & affect appropriate.     Data Reviewed: I have personally reviewed following labs and imaging studies  CBC: Recent Labs  Lab 06/29/20 0500 06/30/20 0430 07/01/20 0430 07/03/20 0500 07/04/20 1142  WBC 11.1* 9.3 7.9 4.0 3.7*  NEUTROABS  --   --   --   --  1.8  HGB 7.3* 8.0* 7.5* 7.4* 7.3*  HCT 20.4* 23.1* 23.1* 23.4* 23.5*  MCV 87.2 90.2 93.9 97.5 99.2  PLT 118* 103* 80* 99* 130*    Basic Metabolic Panel: Recent Labs  Lab 06/30/20 0430 06/30/20 1615 07/01/20 0430 07/01/20 2034 07/02/20 0500 07/03/20 0500 07/04/20 0627 07/04/20 1142  NA 139 137 140 138 138 135  --  139  K 3.4* 4.8 3.3* 4.0 4.1 3.9  --  3.6  CL 108 105 108 107 107 106  --  111  CO2 21* 23 23 25 25 24   --  23  GLUCOSE 89 177* 91 110* 111* 99  --  97  BUN 16 11 14 17 18 20   --  22*  CREATININE 1.42* 1.33* 2.01* 2.53* 2.89* 3.20*  --  3.69*  CALCIUM 7.6* 7.2* 7.3* 7.1* 7.4* 7.1*  --  7.0*  MG 2.5*  --  2.5*  --   2.2 1.9 1.8  --   PHOS 1.8* 4.4 2.9  2.9 2.7 2.7  --   --  3.5    GFR: Estimated Creatinine Clearance: 20 mL/min (A) (by C-G formula based on SCr of 3.69 mg/dL (H)).  Liver Function Tests: Recent Labs  Lab 06/27/20 1830 06/28/20 0535 06/30/20 0430 06/30/20 1615 07/01/20 0430 07/01/20 2034 07/02/20 0500 07/03/20 0500 07/04/20 1142  AST 84*  --  80*  --   --   --   --  22  --   ALT 77*  --  63*  --   --   --   --  27  --  ALKPHOS 166*  --  108  --   --   --   --  85  --   BILITOT 1.1  --  1.3*  --   --   --   --  0.4  --   PROT 6.0*  --  5.0*  --   --   --   --  4.3*  --   ALBUMIN 2.6*   < > 2.1*  2.0*   < > 1.9* 1.8* 1.8* 1.7* 1.8*   < > = values in this interval not displayed.    CBG: Recent Labs  Lab 07/02/20 1939 07/03/20 0042 07/03/20 0433 07/03/20 0727 07/03/20 1136  GLUCAP 107* 106* 101* 86 111*     Recent Results (from the past 240 hour(s))  Culture, blood (routine x 2)     Status: None   Collection Time: 06/27/20 12:54 PM   Specimen: BLOOD  Result Value Ref Range Status   Specimen Description BLOOD BLOOD RIGHT ARM  Final   Special Requests   Final    Blood Culture adequate volume BOTTLES DRAWN AEROBIC AND ANAEROBIC   Culture   Final    NO GROWTH 5 DAYS Performed at [facility], [address] [place], [place] [address]    Report Status 07/02/2020 FINAL  Final  Culture, blood (routine x 2)     Status: None   Collection Time: 06/27/20 12:54 PM   Specimen: BLOOD  Result Value Ref Range Status   Specimen Description BLOOD BLOOD LEFT ARM  Final   Special Requests   Final    Blood Culture adequate volume BOTTLES DRAWN AEROBIC AND ANAEROBIC   Culture   Final    NO GROWTH 5 DAYS Performed at [facility], [address], [place], [place] [address]    Report Status 07/02/2020 FINAL  Final  Resp Panel by RT-PCR (Flu A&B, Covid) Nasopharyngeal Swab     Status: None   Collection Time: 06/27/20  1:25 PM   Specimen: Nasopharyngeal Swab;  Nasopharyngeal(NP) swabs in vial transport medium  Result Value Ref Range Status   SARS Coronavirus 2 by RT PCR NEGATIVE NEGATIVE Final    Comment: (NOTE) SARS-CoV-2 target nucleic acids are NOT DETECTED.  The SARS-CoV-2 RNA is generally detectable in upper respiratory specimens during the acute phase of infection. The lowest concentration of SARS-CoV-2 viral copies this assay can detect is 138 copies/mL. A negative result does not preclude SARS-Cov-2 infection and should not be used as the sole basis for treatment or other patient management decisions. A negative result may occur with  improper specimen collection/handling, submission of specimen other than nasopharyngeal swab, presence of viral mutation(s) within the areas targeted by this assay, and inadequate number of viral copies(<138 copies/mL). A negative result must be combined with clinical observations, patient history, and epidemiological information. The expected result is Negative.  Fact Sheet for Patients:  EntrepreneurPulse.com.au  Fact Sheet for Healthcare Providers:  IncredibleEmployment.be  This test is no t yet approved or cleared by the [place] FDA and  has been authorized for detection and/or diagnosis of SARS-CoV-2 by FDA under an Emergency Use Authorization (EUA). This EUA will remain  in effect (meaning this test can be used) for the duration of the COVID-19 declaration under Section 564(b)(1) of the Act, 21 U.S.C.section 360bbb-3(b)(1), unless the authorization is terminated  or revoked sooner.       Influenza A by PCR NEGATIVE NEGATIVE Final   Influenza B by PCR NEGATIVE NEGATIVE Final  Comment: (NOTE) The Xpert Xpress SARS-CoV-2/FLU/RSV plus assay is intended as an aid in the diagnosis of influenza from Nasopharyngeal swab specimens and should not be used as a sole basis for treatment. Nasal washings and aspirates are unacceptable for Xpert Xpress  SARS-CoV-2/FLU/RSV testing.  Fact Sheet for Patients: EntrepreneurPulse.com.au  Fact Sheet for Healthcare Providers: IncredibleEmployment.be  This test is not yet approved or cleared by the [place] FDA and has been authorized for detection and/or diagnosis of SARS-CoV-2 by FDA under an Emergency Use Authorization (EUA). This EUA will remain in effect (meaning this test can be used) for the duration of the COVID-19 declaration under Section 564(b)(1) of the Act, 21 U.S.C. section 360bbb-3(b)(1), unless the authorization is terminated or revoked.  Performed at [facility], [address] [place],  [address]   MRSA PCR Screening     Status: None   Collection Time: 06/27/20  6:00 PM   Specimen: Nasal Mucosa; Nasopharyngeal  Result Value Ref Range Status   MRSA by PCR NEGATIVE NEGATIVE Final    Comment:        The GeneXpert MRSA Assay (FDA approved for NASAL specimens only), is one component of a comprehensive MRSA colonization surveillance program. It is not intended to diagnose MRSA infection nor to guide or monitor treatment for MRSA infections. Performed at [facility], [place] [address] [place],  [address]          Radiology Studies: No results found.      Scheduled Meds: . (feeding supplement) PROSource Plus  30 mL Oral BID BM  . chlorhexidine  15 mL Mouth Rinse BID  . Chlorhexidine Gluconate Cloth  6 each Topical Daily  . DULoxetine  30 mg Oral BID  . feeding supplement  237 mL Oral BID BM  . fentaNYL  1 patch Transdermal Q72H  . gabapentin  300 mg Oral TID  . heparin  5,000 Units Subcutaneous Q8H  . mouth rinse  15 mL Mouth Rinse q12n4p  . pantoprazole  40 mg Oral Daily  . sodium chloride flush  10-40 mL Intracatheter Q12H   Continuous Infusions: . dextrose 5% lactated ringers 100 mL/hr at 07/04/20 0945  . meropenem (MERREM) IV 500 mg (07/04/20 9038)     LOS: 7 days     Time spent: 40 minutes    Irine Seal, MD [facility]   To contact the attending provider between 7A-7P or the covering provider during after hours 7P-7A, please log into the web site [email or web address] and access using universal [place] password for that web site. If you do not have the password, please call the hospital operator.  07/04/2020, 4:10 PM

## 2020-07-04 NOTE — Evaluation (Signed)
Occupational Therapy Evaluation Patient Details Name: Chase [name] MRN: [id number] DOB: 23-Feb-1966 Today's Date: 07/04/2020    History of Present Illness 55 year old man with a complicated past medical history that includes remote Hodgkin's lymphoma (2010-11), gastric bypass with bleeding anastomotic ulcer and colovesicular fistula, colostomy. He was found 3/29 on the floor by his son, unclear downtime. At  [facility] ED on 3/29 found to have a profound metabolic acidosis with acute on chronic renal failure, elevated CK, relative hypotension, mild transaminitis. CVVHD started 3/29 with significant improvement in severe metabolic acidosis.   His son reported that overall his health has been in steady decline, has difficulty with ADLs.   Clinical Impression   Patient is currently requiring assistance with ADLs including Total assist with toileting (baseline-has HH nurse change our foley cath as needed) and colostomy, minimal assist with LE dressing, bathing, and UE dressing; setup assist to supervision with bed level or recliner level grooming, all of which is below patient's typical baseline of being Modified independent.  During this evaluation, patient was limited by new onset RT lateral losses of balance in sitting, generalized weakness, and impaired activity tolerance with baseline stooped posture, all of which has the potential to impact patient's safety and independence during functional mobility, as well as performance for ADLs. [place] "6-clicks" Daily Activity Inpatient Short Form score of 17/24 indicates 50.11% ADL impairment this session. Patient lives alone with PRN wellness check by his son and is active with RN/PT/OT [place] services through [facility].  Patient demonstrates fair rehab potential, and should benefit from continued skilled occupational therapy services while in acute care to maximize safety, independence and quality of life at home.  Continued occupational therapy  services in a SNF setting prior to return home is recommended, however pt is adamantly declining SNF and agrees only to home health.  ?   Follow Up Recommendations  Home health OT    Equipment Recommendations       Recommendations for Other Services       Precautions / Restrictions Precautions Precautions: Fall Precaution Comments: colostomy Restrictions Weight Bearing Restrictions: No      Mobility Bed Mobility Overal bed mobility: Needs Assistance Bed Mobility: Supine to Sit     Supine to sit: Supervision     General bed mobility comments: Increased time to initiate with verbal cues. Patient Response: Cooperative  Transfers Overall transfer level: Needs assistance   Transfers: Sit to/from [employer] Transfers Sit to Stand: Min assist Stand pivot transfers: Min assist       General transfer comment: patient very forward flexed/kyphotic. Used RW to pivot to recliner.    Balance Overall balance assessment: History of Falls;Needs assistance Sitting-balance support: Feet supported;Bilateral upper extremity supported Sitting balance-Leahy Scale: Poor Sitting balance - Comments: While EOB and in chair, pt leans to the RT, requiring Min guard assist to keep midline balance. States that this is new and does not know why it's occuring. States that he tended to lean LT after his CVA.   Standing balance support: During functional activity;Bilateral upper extremity supported Standing balance-Leahy Scale: Poor Standing balance comment: reliant on UE support, very stooped.                           ADL either performed or assessed with clinical judgement   ADL Overall ADL's : Needs assistance/impaired Eating/Feeding: Modified independent   Grooming: Wash/dry hands;Sitting;Set up;Bed level   Upper Body Bathing: Sitting;Minimal assistance;Set  up   Lower Body Bathing: Minimal assistance   Upper Body Dressing : Minimal assistance;Sitting   Lower  Body Dressing: Minimal assistance;Sitting/lateral leans Lower Body Dressing Details (indicate cue type and reason): Able to demonstrate figure 4 positioning while sitting EOB ad simulate socks on/off. Anticipate need of Min As with standing. Toilet Transfer: Heritage manager and Hygiene: Total assistance Toileting - Clothing Manipulation Details (indicate cue type and reason): Pt on indwelling catheter at home, has home health nurse of [place] change folet PRN. Has colostomy.   Tub/Shower Transfer Details (indicate cue type and reason): Pt does not enter shower at home. Takes sponge baths. Functional mobility during ADLs: Min guard;Rolling walker       Vision   Vision Assessment?: No apparent visual deficits     Perception     Praxis      Pertinent Vitals/Pain Pain Assessment: 0-10 Pain Score: 9  Pain Location: All the joints of the body. Pain Intervention(s): Limited activity within patient's tolerance;Monitored during session;Premedicated before session;Repositioned;Patient requesting pain meds-RN notified     Hand Dominance Right   Extremity/Trunk Assessment Upper Extremity Assessment Upper Extremity Assessment: Generalized weakness   Lower Extremity Assessment Lower Extremity Assessment: Generalized weakness   Cervical / Trunk Assessment Cervical / Trunk Assessment: Kyphotic   Communication Communication Communication: No difficulties   Cognition Arousal/Alertness: Awake/alert Behavior During Therapy: WFL for tasks assessed/performed Overall Cognitive Status: No family/caregiver present to determine baseline cognitive functioning Area of Impairment: Safety/judgement;Awareness;Attention                   Current Attention Level: Focused     Safety/Judgement: Decreased awareness of safety;Decreased awareness of deficits Awareness: Emergent   General Comments: states reason for here based on information given to him, he  states he does not remember events.   General Comments       Exercises     Shoulder Instructions      Home Living Family/patient expects to be discharged to:: Private residence (Pt refusing SNF. Wants to contiue HH OT and PT) Living Arrangements: Alone;Children Available Help at Discharge: Family;Friend(s);Available PRN/intermittently Type of Home: House Home Access: Ramped entrance     Home Layout: One level     Bathroom Shower/Tub: Occupational psychologist: Handicapped height Bathroom Accessibility: Yes How Accessible: Accessible via walker Home Equipment: Bedside commode   Additional Comments: Has two bedside commodes: Keeps one over a toilet and the other used as a shower chair in pt's walk in shower.      Prior Functioning/Environment Level of Independence: Needs assistance  Gait / Transfers Assistance Needed: rollator for ambulation, stated he drove for first time in [place] recently. Has frequent falls. ADL's / Homemaking Assistance Needed: sponge bathes, has s housekeeper 1 time a month for heavy housework. Otherwise pt reports Mod independence with ADLs and IADLs.   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, prior [employer] and enjoys fishing        OT Problem List: Decreased strength;Pain;Decreased coordination;Decreased activity tolerance;Decreased safety awareness;Decreased knowledge of use of DME or AE;Decreased knowledge of precautions;Impaired UE functional use;Impaired balance (sitting and/or standing)      OT Treatment/Interventions: Self-care/ADL training;Therapeutic exercise;Therapeutic activities;Cognitive remediation/compensation;Energy conservation;Patient/family education;DME and/or AE instruction;Balance training    OT Goals(Current goals can be found in the care plan section) Acute Rehab OT Goals Patient Stated Goal: To go home OT Goal Formulation: With patient ADL Goals Pt Will Perform Grooming: standing;with modified  independence Pt Will Perform Lower Body Bathing: with modified independence;with adaptive equipment Pt Will Perform Lower Body Dressing: with modified independence;sitting/lateral leans;sit to/from stand;with adaptive equipment Pt Will Transfer to Toilet: with modified independence;bedside commode;ambulating Pt Will Perform Toileting - Clothing Manipulation and hygiene: with modified independence;with adaptive equipment;sitting/lateral leans;sit to/from stand Pt/caregiver will Perform Home Exercise Program: Increased strength;Both right and left upper extremity;Independently Additional ADL Goal #1: Pt will improve sitting balance to good static and fair+ dynamic in order to increase safety and participation during seated ADLs.  OT Frequency: Min 2X/week   Barriers to D/C: Decreased caregiver support  Lives alone.       Co-evaluation              AM-PAC OT "6 Clicks" Daily Activity     Outcome Measure Help from another person eating meals?: None Help from another person taking care of personal grooming?: A Little Help from another person toileting, which includes using toliet, bedpan, or urinal?: Total Help from another person bathing (including washing, rinsing, drying)?: A Little Help from another person to put on and taking off regular upper body clothing?: A Little Help from another person to put on and taking off regular lower body clothing?: A Little 6 Click Score: 17   End of Session Equipment Utilized During Treatment: Gait belt;Rolling walker Nurse Communication: Mobility status;Patient requests pain meds (RN agreed that pt does not require chair alarm.)  Activity Tolerance: Patient tolerated treatment well Patient left: in chair;with call bell/phone within reach  OT Visit Diagnosis: Unsteadiness on feet (R26.81);Pain Pain - Right/Left:  (Bilateral) Pain - part of body: Shoulder;Arm;Hand;Hip;Leg;Knee;Ankle and joints of foot                Time: 1013-1057 OT Time  Calculation (min): 44 min Charges:  OT General Charges $OT Visit: 1 Visit OT Evaluation $OT Eval Low Complexity: 1 Low OT Treatments $Therapeutic Activity: 8-22 mins  Anderson Malta, OT Acute Rehab Services Office: [phone] 07/04/2020  Julien Girt 07/04/2020, 12:13 PM

## 2020-07-04 NOTE — Progress Notes (Signed)
[facility] ROUNDING NOTE   Subjective:   Brief history.  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  He also has a remote history of Hodgkin's lymphoma. He had acute on chronic renal dysfunction with severe metabolic acidosis and altered mental status.  He was started on CRRT therapy 06/27/2020-06/30/2020.  Have discontinued CRRT therapy and monitoring renal function   Seen in room. Pt up in the chair.  Pt states he is living at home by himself now, gets around w/ a walker mostly and has a motorized machine on the way. [facility]b [facility]. He has driven his car recently a couple of times. Taking care of himself. He is interested in dialysis if need be and would prefer [facility] but if they don't take [place] insurance he says he will happily be seen for kidney needs at the [place] in [place]. He lives in [place].     Objective:  Vital signs in last 24 hours:  Temp:  [97.6 F (36.4 C)-99 F (37.2 C)] 97.6 F (36.4 C) (04/05 1503) Pulse Rate:  [64-79] 79 (04/05 1503) Resp:  [16-18] 17 (04/05 0620) BP: (111-122)/(65-72) 111/70 (04/05 1503) SpO2:  [98 %-100 %] 100 % (04/05 1503) Weight:  [63.3 kg] 63.3 kg (04/05 0819)  Weight change:  Filed Weights   07/02/20 0500 07/03/20 0400 07/04/20 0819  Weight: 62.9 kg 62.3 kg 63.3 kg    Intake/Output: I/O last 3 completed shifts: In: 4246.6 [P.O.:680; I.V.:3314.6; IV Piggyback:252] Out: 4100 [Urine:3650; Stool:450]   Intake/Output this shift:  Total I/O In: 574.9 [I.V.:574.9] Out: 950 [Urine:950]  Exam: Constitutional: sitting up in chair eating breakfast, no distress ENMT: ears and nose without scars or lesions, MMM CV: normal rate, no edema Respiratory: clear to auscultation, normal work of  breathing Gastrointestinal: soft, non-tender, no palpable masses or hernias, +colostomy Skin: no visible lesions or rashes Neuro: nonfocal, Ox 3, conversant   Assessment/ Plan:  1. Acute on chronic kidney disease.  Multiple factors leading to acute injury.  Baseline creatinine about 5 mg/dL.  Admitted with altered mental status severe metabolic acidosis.  SP CRRT dc'd on 06/30/2020. Creat up slightly to 3.6 today, no uremic signs. Good UOP. Pt is significantly less deconditioned here now then on prior admits last year. He knows he may or may not tolerate HD well but would like to give it a try if needed. No indication at this time. Cont IVF's.  2. Hypokalemia- replete prn  3. Metabolic acidosis- appears to have resolved. 4. Chronic kidney disease-  with obstructive uropathy, urology following 5. Sepsis antibiotics and cultures as per primary service 6. Anemia transfuse as needed  Kelly Splinter, MD 07/04/2020, 4:50 PM   Basic Metabolic Panel: Recent Labs  Lab 06/30/20 0430 06/30/20 1615 07/01/20 0430 07/01/20 2034 07/02/20 0500 07/03/20 0500 07/04/20 0627 07/04/20 1142  NA 139 137 140 138 138 135  --  139  K 3.4* 4.8 3.3* 4.0 4.1 3.9  --  3.6  CL 108 105 108 107 107 106  --  111  CO2 21* 23 23 25 25 24   --  23  GLUCOSE 89 177* 91 110* 111* 99  --  97  BUN 16 11 14 17 18 20   --  22*  CREATININE 1.42* 1.33* 2.01* 2.53* 2.89* 3.20*  --  3.69*  CALCIUM 7.6* 7.2* 7.3* 7.1* 7.4* 7.1*  --  7.0*  MG 2.5*  --  2.5*  --  2.2 1.9 1.8  --   PHOS 1.8* 4.4 2.9  2.9 2.7 2.7  --   --  3.5   CBC: Recent Labs  Lab 06/29/20 0500 06/30/20 0430 07/01/20 0430 07/03/20 0500 07/04/20 1142  WBC 11.1* 9.3 7.9 4.0 3.7*  NEUTROABS  --   --   --   --  1.8  HGB 7.3* 8.0* 7.5* 7.4* 7.3*  HCT 20.4* 23.1* 23.1* 23.4* 23.5*  MCV 87.2 90.2 93.9 97.5 99.2  PLT 118* 103* 80* 99* 130*      Medications:   . dextrose 5% lactated ringers 100 mL/hr at 07/04/20 0945  . meropenem (MERREM) IV 500 mg  (07/04/20 6060)   . (feeding supplement) PROSource Plus  30 mL Oral BID BM  . chlorhexidine  15 mL Mouth Rinse BID  . Chlorhexidine Gluconate Cloth  6 each Topical Daily  . DULoxetine  30 mg Oral BID  . feeding supplement  237 mL Oral BID BM  . fentaNYL  1 patch Transdermal Q72H  . folic acid  1 mg Oral Daily  . gabapentin  300 mg Oral TID  . heparin  5,000 Units Subcutaneous Q8H  . mouth rinse  15 mL Mouth Rinse q12n4p  . pantoprazole  40 mg Oral Daily  . sodium chloride flush  10-40 mL Intracatheter Q12H   acetaminophen, HYDROmorphone (DILAUDID) injection, oxyCODONE, polyethylene glycol, sodium chloride flush    Kelly Splinter, MD 07/04/2020, 4:50 PM

## 2020-07-05 DIAGNOSIS — G9341 Metabolic encephalopathy: Secondary | ICD-10-CM | POA: Diagnosis not present

## 2020-07-05 DIAGNOSIS — D638 Anemia in other chronic diseases classified elsewhere: Secondary | ICD-10-CM | POA: Diagnosis not present

## 2020-07-05 DIAGNOSIS — N179 Acute kidney failure, unspecified: Secondary | ICD-10-CM | POA: Diagnosis not present

## 2020-07-05 DIAGNOSIS — R338 Other retention of urine: Secondary | ICD-10-CM | POA: Diagnosis not present

## 2020-07-05 LAB — RENAL FUNCTION PANEL
Albumin: 1.7 g/dL — ABNORMAL LOW (ref 3.5–5.0)
Anion gap: 6 (ref 5–15)
BUN: 22 mg/dL — ABNORMAL HIGH (ref 6–20)
CO2: 21 mmol/L — ABNORMAL LOW (ref 22–32)
Calcium: 6.9 mg/dL — ABNORMAL LOW (ref 8.9–10.3)
Chloride: 112 mmol/L — ABNORMAL HIGH (ref 98–111)
Creatinine, Ser: 3.85 mg/dL — ABNORMAL HIGH (ref 0.61–1.24)
GFR, Estimated: 18 mL/min — ABNORMAL LOW (ref >60)
Glucose, Bld: 86 mg/dL (ref 70–99)
Phosphorus: 3.7 mg/dL (ref 2.5–4.6)
Potassium: 3.6 mmol/L (ref 3.5–5.1)
Sodium: 139 mmol/L (ref 135–145)

## 2020-07-05 LAB — CBC
HCT: 22.2 % — ABNORMAL LOW (ref 39.0–52.0)
Hemoglobin: 6.7 g/dL — CL (ref 13.0–17.0)
MCH: 30.9 pg (ref 26.0–34.0)
MCHC: 30.2 g/dL (ref 30.0–36.0)
MCV: 102.3 fL — ABNORMAL HIGH (ref 80.0–100.0)
Platelets: 130 10*3/uL — ABNORMAL LOW (ref 150–400)
RBC: 2.17 MIL/uL — ABNORMAL LOW (ref 4.22–5.81)
RDW: 16.2 % — ABNORMAL HIGH (ref 11.5–15.5)
WBC: 3.1 10*3/uL — ABNORMAL LOW (ref 4.0–10.5)
nRBC: 0.7 % — ABNORMAL HIGH (ref 0.0–0.2)

## 2020-07-05 LAB — PREPARE RBC (CROSSMATCH)

## 2020-07-05 LAB — HEMOGLOBIN AND HEMATOCRIT, BLOOD
HCT: 29.6 % — ABNORMAL LOW (ref 39.0–52.0)
Hemoglobin: 9.4 g/dL — ABNORMAL LOW (ref 13.0–17.0)

## 2020-07-05 LAB — MAGNESIUM: Magnesium: 1.7 mg/dL (ref 1.7–2.4)

## 2020-07-05 MED ORDER — SODIUM CHLORIDE 0.9% IV SOLUTION: Freq: Once | INTRAVENOUS | Status: AC

## 2020-07-05 NOTE — Progress Notes (Signed)
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  Patient had been admitted in April 05, 2020 with acute on chronic renal failure related to obstructive nephropathy after his nephrostomy tubes were removed.  He had internal ureteral stents replaced at that time and a diverting loop colostomy was also performed for the fistula.  He was placed on high-dose Neurontin and narcotics for chronic pain and history of PTSD.  Subsequently he was discharged to the hospital and on 06/27/2020 he was found on the floor by his son of unclear downtime.  His dressing will admit date 06/27/2018 when he was evaluated by EMS after a fall with question of narcotic overdose.  He is not brought to the ED at that time but then when his son found him on the floor his brought [facility] and found to have a profound metabolic acidosis with acute on chronic renal failure, elevated CK, relative hypotension, mild transaminitis.  He is started on IV bicarbonate, IV fluid resuscitation and received IV ceftriaxone and was escalated to IV vancomycin and meropenem and completed 7 days of IV meropenem.  Subsequently he ended up requiring CRRT while in the ICU and then transferred out of the ICU on 07/04/2020 to [facility] service.  Currently the patient is improved from a mentation standpoint and  the plan is for stent exchange tomorrow by urology.  His renal function continues to climb after his CRRT but his baseline creatinine is between 4-4.5.  Assessment & Plan:   Active Problems:   Shock ([place])   Kidney failure   Pressure injury of skin   Delirium   Sepsis with acute renal failure (HCC)   Anemia of chronic disease   Acute urinary retention   Bilateral hydronephrosis   Acute metabolic encephalopathy   Acute kidney injury superimposed on chronic kidney disease (HCC)  Severe anion gap metabolic acidosis  -Likely secondary to acute kidney injury on chronic kidney disease stage V. -Status post CRRT therapy (3/29-4/1). -Had a mild acidosis today with a CO2 of 21, anion gap of 6, chloride level 112 -Continue monitor and trend and repeat CMP in a.m.  Acute on chronic kidney disease stage V with history of urinary obstruction post bilateral nephrostomy (04/2020) Probable Rhabdomyolyiss  -Likely multifactorial leading to acute kidney injury/ATN from sepsis and possible rhabdomyolysis superimposed. -Status post CRRT therapy (3/29-4/1). -Patient is BUN/creatinine improved although it is 16/1.42 has steadily trended up and worsening and today it is 22/3.85; Baseline Cr was 4-4.5 as an outpatient -It is noted that patient with prior history of acute kidney injury with temporary HD. -Nephrology following and feel at this time patient may be considered as a long-term HD candidate if needed. -Nephrology recommended transfusing 2 units of PRBCs and feel that he has no signs of uremia and he continues a good urine output. -CK was elevated and trended down to 1233 on 06/28/20 -Per nephrology the patient would like to  try hemodialysis if needed and that after discussion with the patient that the patient may or may not tolerate it well in the long-term setting -Nephrology has discontinued his IV fluids as the patient is eating and drinking well and they feel that his creatinine leveling off and  we will continue to follow conservatively  Acute Metabolic Encephalopathy -Likely secondary to above in the setting of uremia -Currently improved. -Patient currently at baseline.  Resolved. -Continue monitor and trend carefully  Acute Respiratory Failure with Hypoxia -Improved and Resolved -SpO2: 99 % O2 Flow Rate (L/min): 3 L/min; No Longer on Supplemental O2 via [place] -Continue to Monitor and Trend Respiratory Status   Septic shock -??  Etiology but possibly the Urine.  Patient with component of hypovolemia likely also leading to shock. -2D echo obtained with normal EF,NWMA. -Patient was on stress dose steroids which have subsequently been discontinued. -Blood cultures with no growth to date. -Alysis showed a turbid appearance with moderate hemoglobin, 5 ketones, moderate leukocytes, negative nitrites, many bacteria, 11-29 squamous epithelial cells, 6-10 RBCs per high-power field, greater than 50 WBCs; unfortunately urine culture was never obtained on admission -Was empirically on IV vancomycin and IV Meropenem. -IV vancomycin discontinued. -Had a Colovesical Fistula that spontaneously closed up.  -Continue IV Meropenem and discontinue after today's doses to complete a 7-day course. -Continue to follow WBC and temperature curve -Patient has 650 mg acetaminophen every 6 hours as needed for Fever  Chronic Pain Syndrome  -On Opiate/Fentanyl patch 25 mcg/h 1 patch transdermal every 72 hours -Continue duloxetine 30 mg p.o. twice daily and gabapentin 300 mg p.o. 3 times daily. -Patient also has Oxycodone 5 to 10 mg p.o. every 4 hours as needed for moderate and severe pain for breakthrough pain  Bilateral Hydronephrosis  -Patient with chronic indwelling stents last changed about 2-1/2 months ago.  Patient being followed by urology. -Urology recommending change of stents later this week possibly on Thursday versus outpatient setting. -Per urology.  Urinary Retention -Continue  urethral catheter for now.   -Urology feels that he does have evidence of detrusor function on recent urodynamics and appears to have bladder outlet obstruction most likely from BPH -Per Urology they are going to assess the patient for restarting alpha-blocker therapy and voiding trial once stabilization of his renal function occurs  Abnormal LFTs/Mild Transaminitis -Likely secondary to septic shock. -Improved and on last check normalized with an AST of 22 and ALT of 27 -Continue to Monitor and Trend -Repeat CMP in the AM   GERD -Continue with Pantoprazole 40 mg p.o. daily  History of diverting colostomy/colovesicular fistula -Continue colostomy care with WOC nurse   Severe Protein Calorie Malnutrition in the context of Chronic illness -Nutritionist consulted for further evaluation and recommendations -Continue Nutritional Supplementation with Ensure Enlive BID, 30 mL Prosource Plus BID and 1 Tab MVI with Minerals/Day   Coronary Artery Disease -Currently Aspirin, Plavix, statin on hold.  -Likely resume medications in the next 24 to 48 hours but will hold off for now until Urological Plan is determined   Anemia of Chronic Disease/Folate Deficiency -Hemoglobin currently stable at 7.3. -Anemia panel was done and showed an Iron level of 83, UIBC of 42, TIBC of 125, saturation ratios of 66%, ferritin level of 284, folate level 4.0, and vitamin Z85 885 -Start folic acid 1 mg daily. -Transfusion threshold hemoglobin < 7. -Patient's hemoglobin/hematocrit fell from 7.3/23.5 and is 6.7/22.2 this morning  -We will type and screen and transfuse 2 units of PRBCs -Continue  to monitor for signs and symptoms of bleeding; currently no overt bleeding noted  -Repeat CBC in the morning  Pancytopenia -Patient's WBC is now 3.1 -Patient's Hgb/Hct is now 6.7/22.2 -Platelet Count is 130 -Continue to Monitor for S/Sx of Bleeding; Getting 2 units of pRBCs today -Repeat CBC in the  AM  Hypoalbuminemia -Patient's Albumin is 1.7 -Supplementation as above   Generalized Weakness and Profound Deconditioning/Debility -PT/OT consulted and recommending Home Health PT vs SNF   Pressure Injury, POA Pressure Injury 11/08/19 Coccyx Upper Stage 2 -  Partial thickness loss of dermis presenting as a shallow open injury with a red, pink wound bed without slough. unblanchable with an open slit in the middle non draining  (Active)  11/08/19 0300  Location: Coccyx  Location Orientation: Upper  Staging: Stage 2 -  Partial thickness loss of dermis presenting as a shallow open injury with a red, pink wound bed without slough.  Wound Description (Comments): unblanchable with an open slit in the middle non draining   Present on Admission: Yes     Pressure Injury 06/27/20 Coccyx Mid Stage 2 -  Partial thickness loss of dermis presenting as a shallow open injury with a red, pink wound bed without slough. pink, red (Active)  06/27/20 1800  Location: Coccyx  Location Orientation: Mid  Staging: Stage 2 -  Partial thickness loss of dermis presenting as a shallow open injury with a red, pink wound bed without slough.  Wound Description (Comments): pink, red  Present on Admission: Yes   DVT prophylaxis: SCDs, heparin 5000 units subcu every 8 hours Code Status: DO NOT RESUSCITATE Family Communication: No family present at bedside but I updated his son [name] via telephone Disposition Plan: Pending further clinical improvement and clearance by Nephrology and Urology   Status is: Inpatient  Remains inpatient appropriate because:Unsafe d/c plan, IV treatments appropriate due to intensity of illness or inability to take PO and Inpatient level of care appropriate due to severity of illness   Dispo: The patient is from: Home              Anticipated d/c is to: TBD              Patient currently is not medically stable to d/c.   Difficult to place patient No  Consultants:   PCCM  Transfer  Nephrology   Urology   Significant Events/Procedures:  Significant Hospital Events: Including procedures, antibiotic start and stop dates in addition to other pertinent events    UDS 3/29 >> negative  CT abdomen/pelvis 3/29 >> bilateral renal atrophy, bilateral ureteral stents are noted in good position without any hydronephrosis.  Foley catheter present.  History gastric surgery with left lower quadrant colostomy.  Renal ultrasound 3/29 >> stable moderate bilateral hydroureteronephrosis, right ureteral stent extending to the renal pelvis, left ureteral stent faintly visualized.  Echogenic focus in the left kidney question nonobstructing stone or air bubble  CT pelvis/left hip 3/29 >> no evidence of hip fracture or dislocation normal joint space, decreased offset of the bilateral femoral head neck junctions without suspicious bony lesion  Chest x-ray 3/29 >> lungs clear, no infiltrates  Head CT 3/29 >> no acute intracranial abnormality, old right occipital lobe infarct  Blood cultures 3/29 ([facility]) >> neg   CVVHD started 3/29 with significant improvement in severe metabolic acidosis  Developed shock 3/29 when CVVH started, on norepinephrine  NG tube attempted 3/30, unsuccessful  Echocardiogram 3/30 >> LVEF 60-65% without regional wall motion abnormality, no diastolic  dysfunction.  Normal RV size and function, normal PASP.  No mention of valvular abnormality  4/1 d/c'd cvvh  Physical therapy/ mobilization started   AM Cortisol 4/4 >>>  Antimicrobials:  Anti-infectives (From admission, onward)   Start     Dose/Rate Route Frequency Ordered Stop   07/02/20 1800  meropenem (MERREM) 500 mg in sodium chloride 0.9 % 100 mL IVPB        500 mg 200 mL/hr over 30 Minutes Intravenous Every 12 hours 07/02/20 0714 07/04/20 1818   07/01/20 0600  meropenem (MERREM) 1 g in sodium chloride 0.9 % 100 mL IVPB  Status:  Discontinued        1 g 200 mL/hr over 30 Minutes Intravenous  Every 12 hours 06/30/20 2005 07/02/20 0714   06/29/20 1700  vancomycin (VANCOREADY) IVPB 750 mg/150 mL  Status:  Discontinued        750 mg 150 mL/hr over 60 Minutes Intravenous Every 24 hours 06/28/20 1536 06/30/20 0854   06/28/20 1700  vancomycin (VANCOREADY) IVPB 1000 mg/200 mL        1,000 mg 200 mL/hr over 60 Minutes Intravenous  Once 06/28/20 1536 06/28/20 1841   06/28/20 1600  meropenem (MERREM) 1 g in sodium chloride 0.9 % 100 mL IVPB  Status:  Discontinued        1 g 200 mL/hr over 30 Minutes Intravenous Every 8 hours 06/28/20 1533 06/30/20 2005   06/27/20 1930  cefTRIAXone (ROCEPHIN) 1 g in sodium chloride 0.9 % 100 mL IVPB  Status:  Discontinued        1 g 200 mL/hr over 30 Minutes Intravenous Daily-1800 06/27/20 1839 06/28/20 1524   06/27/20 1515  cefTRIAXone (ROCEPHIN) 2 g in sodium chloride 0.9 % 100 mL IVPB        2 g 200 mL/hr over 30 Minutes Intravenous  Once 06/27/20 1506 06/27/20 1551        Subjective: Seen and examined at bedside he was wanting to rest.  He denies any complaints or pain at this time.  No nausea or vomiting.  Felt okay.  Was getting 2 units of blood because his blood count dropped.  Plan is for stent exchange in the morning.  No other concerns or complaints at this time.  Objective: Vitals:   07/05/20 0807 07/05/20 1030 07/05/20 1049 07/05/20 1314  BP: 120/71 112/70 110/75 122/73  Pulse: 73 60 60 64  Resp: 16 14 14 14   Temp: 99.7 F (37.6 C) 98.4 F (36.9 C) 98.6 F (37 C) 98.3 F (36.8 C)  TempSrc: Oral Oral Oral Oral  SpO2: 100% 97% 97% 99%  Weight:      Height:        Intake/Output Summary (Last 24 hours) at 07/05/2020 1424 Last data filed at 07/05/2020 1034 Gross per 24 hour  Intake 735 ml  Output 2100 ml  Net -1365 ml   Filed Weights   07/02/20 0500 07/03/20 0400 07/04/20 0819  Weight: 62.9 kg 62.3 kg 63.3 kg   Examination: Physical Exam:  Constitutional: Thin chronically ill-appearing Caucasian male currently in no acute  distress appears calm and slightly uncomfortable resting Eyes: Lids and conjunctivae normal, sclerae anicteric  ENMT: External Ears, Nose appear normal. Grossly normal hearing.  Neck: Appears normal, supple, no cervical masses, normal ROM, no appreciable thyromegaly; no JVD Respiratory: Clear to auscultation bilaterally, no wheezing, rales, rhonchi or crackles. Normal respiratory effort and patient is not tachypenic. No accessory muscle use.  Cardiovascular: RRR, no murmurs /  rubs / gallops. S1 and S2 auscultated.  Trace extremity edema Abdomen: Soft, mildly-tender, non-distended.  Colostomy bag in place with air and liquid feces bowel sounds positive.  GU: Deferred. Musculoskeletal: No clubbing / cyanosis of digits/nails. No joint deformity upper and lower extremities.  Skin: No rashes, lesions, ulcers on limited skin evaluation and I did not turn the patient to abuse coccyx ulcers. No induration; Warm and dry.  Neurologic: CN 2-12 grossly intact with no focal deficits.  Romberg sign and cerebellar reflexes not assessed.  Psychiatric: Normal judgment and insight. Alert and oriented x 3. Normal mood and appropriate affect.   Data Reviewed: I have personally reviewed following labs and imaging studies  CBC: Recent Labs  Lab 06/30/20 0430 07/01/20 0430 07/03/20 0500 07/04/20 1142 07/05/20 0501  WBC 9.3 7.9 4.0 3.7* 3.1*  NEUTROABS  --   --   --  1.8  --   HGB 8.0* 7.5* 7.4* 7.3* 6.7*  HCT 23.1* 23.1* 23.4* 23.5* 22.2*  MCV 90.2 93.9 97.5 99.2 102.3*  PLT 103* 80* 99* 130* 026*   Basic Metabolic Panel: Recent Labs  Lab 07/01/20 0430 07/01/20 2034 07/02/20 0500 07/03/20 0500 07/04/20 0627 07/04/20 1142 07/05/20 0501  NA 140 138 138 135  --  139 139  K 3.3* 4.0 4.1 3.9  --  3.6 3.6  CL 108 107 107 106  --  111 112*  CO2 23 25 25 24   --  23 21*  GLUCOSE 91 110* 111* 99  --  97 86  BUN 14 17 18 20   --  22* 22*  CREATININE 2.01* 2.53* 2.89* 3.20*  --  3.69* 3.85*  CALCIUM 7.3*  7.1* 7.4* 7.1*  --  7.0* 6.9*  MG 2.5*  --  2.2 1.9 1.8  --  1.7  PHOS 2.9  2.9 2.7 2.7  --   --  3.5 3.7   GFR: Estimated Creatinine Clearance: 19.2 mL/min (A) (by C-G formula based on SCr of 3.85 mg/dL (H)). Liver Function Tests: Recent Labs  Lab 06/30/20 0430 06/30/20 1615 07/01/20 2034 07/02/20 0500 07/03/20 0500 07/04/20 1142 07/05/20 0501  AST 80*  --   --   --  22  --   --   ALT 63*  --   --   --  27  --   --   ALKPHOS 108  --   --   --  85  --   --   BILITOT 1.3*  --   --   --  0.4  --   --   PROT 5.0*  --   --   --  4.3*  --   --   ALBUMIN 2.1*  2.0*   < > 1.8* 1.8* 1.7* 1.8* 1.7*   < > = values in this interval not displayed.   No results for input(s): LIPASE, AMYLASE in the last 168 hours. No results for input(s): AMMONIA in the last 168 hours. Coagulation Profile: No results for input(s): INR, PROTIME in the last 168 hours. Cardiac Enzymes: No results for input(s): CKTOTAL, CKMB, CKMBINDEX, TROPONINI in the last 168 hours. BNP (last 3 results) No results for input(s): PROBNP in the last 8760 hours. HbA1C: No results for input(s): HGBA1C in the last 72 hours. CBG: Recent Labs  Lab 07/02/20 1939 07/03/20 0042 07/03/20 0433 07/03/20 0727 07/03/20 1136  GLUCAP 107* 106* 101* 86 111*   Lipid Profile: No results for input(s): CHOL, HDL, LDLCALC, TRIG, CHOLHDL, LDLDIRECT in the last 72  hours. Thyroid Function Tests: No results for input(s): TSH, T4TOTAL, FREET4, T3FREE, THYROIDAB in the last 72 hours. Anemia Panel: Recent Labs    07/04/20 1142 07/04/20 1143  VITAMINB12  --  619  FOLATE 4.0*  --   FERRITIN  --  284  TIBC  --  125*  IRON  --  83  RETICCTPCT 2.0  --    Sepsis Labs: Recent Labs  Lab 06/29/20 0500 06/30/20 0430  PROCALCITON 12.88 4.17  LATICACIDVEN 0.9  --     Recent Results (from the past 240 hour(s))  Culture, blood (routine x 2)     Status: None   Collection Time: 06/27/20 12:54 PM   Specimen: BLOOD  Result Value Ref Range  Status   Specimen Description BLOOD BLOOD RIGHT ARM  Final   Special Requests   Final    Blood Culture adequate volume BOTTLES DRAWN AEROBIC AND ANAEROBIC   Culture   Final    NO GROWTH 5 DAYS Performed at [facility], [address], [place], [place] [address]    Report Status 07/02/2020 FINAL  Final  Culture, blood (routine x 2)     Status: None   Collection Time: 06/27/20 12:54 PM   Specimen: BLOOD  Result Value Ref Range Status   Specimen Description BLOOD BLOOD LEFT ARM  Final   Special Requests   Final    Blood Culture adequate volume BOTTLES DRAWN AEROBIC AND ANAEROBIC   Culture   Final    NO GROWTH 5 DAYS Performed at [facility], [address] [place], [place] [address]    Report Status 07/02/2020 FINAL  Final  Resp Panel by RT-PCR (Flu A&B, Covid) Nasopharyngeal Swab     Status: None   Collection Time: 06/27/20  1:25 PM   Specimen: Nasopharyngeal Swab; Nasopharyngeal(NP) swabs in vial transport medium  Result Value Ref Range Status   SARS Coronavirus 2 by RT PCR NEGATIVE NEGATIVE Final    Comment: (NOTE) SARS-CoV-2 target nucleic acids are NOT DETECTED.  The SARS-CoV-2 RNA is generally detectable in upper respiratory specimens during the acute phase of infection. The lowest concentration of SARS-CoV-2 viral copies this assay can detect is 138 copies/mL. A negative result does not preclude SARS-Cov-2 infection and should not be used as the sole basis for treatment or other patient management decisions. A negative result may occur with  improper specimen collection/handling, submission of specimen other than nasopharyngeal swab, presence of viral mutation(s) within the areas targeted by this assay, and inadequate number of viral copies(<138 copies/mL). A negative result must be combined with clinical observations, patient history, and epidemiological information. The expected result is Negative.  Fact Sheet for Patients:   EntrepreneurPulse.com.au  Fact Sheet for Healthcare Providers:  IncredibleEmployment.be  This test is no t yet approved or cleared by the [place] FDA and  has been authorized for detection and/or diagnosis of SARS-CoV-2 by FDA under an Emergency Use Authorization (EUA). This EUA will remain  in effect (meaning this test can be used) for the duration of the COVID-19 declaration under Section 564(b)(1) of the Act, 21 U.S.C.section 360bbb-3(b)(1), unless the authorization is terminated  or revoked sooner.       Influenza A by PCR NEGATIVE NEGATIVE Final   Influenza B by PCR NEGATIVE NEGATIVE Final    Comment: (NOTE) The Xpert Xpress SARS-CoV-2/FLU/RSV plus assay is intended as an aid in the diagnosis of influenza from Nasopharyngeal swab specimens and should not be used as a sole basis for treatment. Nasal  washings and aspirates are unacceptable for Xpert Xpress SARS-CoV-2/FLU/RSV testing.  Fact Sheet for Patients: EntrepreneurPulse.com.au  Fact Sheet for Healthcare Providers: IncredibleEmployment.be  This test is not yet approved or cleared by the [place] FDA and has been authorized for detection and/or diagnosis of SARS-CoV-2 by FDA under an Emergency Use Authorization (EUA). This EUA will remain in effect (meaning this test can be used) for the duration of the COVID-19 declaration under Section 564(b)(1) of the Act, 21 U.S.C. section 360bbb-3(b)(1), unless the authorization is terminated or revoked.  Performed at [facility], [address], [place], [place] [address]   MRSA PCR Screening     Status: None   Collection Time: 06/27/20  6:00 PM   Specimen: Nasal Mucosa; Nasopharyngeal  Result Value Ref Range Status   MRSA by PCR NEGATIVE NEGATIVE Final    Comment:        The GeneXpert MRSA Assay (FDA approved for NASAL specimens only), is one component of a comprehensive MRSA  colonization surveillance program. It is not intended to diagnose MRSA infection nor to guide or monitor treatment for MRSA infections. Performed at [facility], [place] [address] [place], [place] [address]      RN Pressure Injury Documentation: Pressure Injury 11/08/19 Coccyx Upper Stage 2 -  Partial thickness loss of dermis presenting as a shallow open injury with a red, pink wound bed without slough. unblanchable with an open slit in the middle non draining  (Active)  11/08/19 0300  Location: Coccyx  Location Orientation: Upper  Staging: Stage 2 -  Partial thickness loss of dermis presenting as a shallow open injury with a red, pink wound bed without slough.  Wound Description (Comments): unblanchable with an open slit in the middle non draining   Present on Admission: Yes     Pressure Injury 06/27/20 Coccyx Mid Stage 2 -  Partial thickness loss of dermis presenting as a shallow open injury with a red, pink wound bed without slough. pink, red (Active)  06/27/20 1800  Location: Coccyx  Location Orientation: Mid  Staging: Stage 2 -  Partial thickness loss of dermis presenting as a shallow open injury with a red, pink wound bed without slough.  Wound Description (Comments): pink, red  Present on Admission: Yes   Estimated body mass index is 22.52 kg/m as calculated from the following:   Height as of this encounter: 5\' 6"  (1.676 m).   Weight as of this encounter: 63.3 kg.  Malnutrition Type:  Nutrition Problem: Severe Malnutrition Etiology: chronic illness (stage 4-5 CKD)  Malnutrition Characteristics:  Signs/Symptoms: severe fat depletion,severe muscle depletion  Nutrition Interventions:  Interventions: Ensure Enlive (each supplement provides 350kcal and 20 grams of protein),Prostat,MVI   Radiology Studies: No results found.  Scheduled Meds: . (feeding supplement) PROSource Plus  30 mL Oral BID BM  . chlorhexidine  15 mL Mouth Rinse BID  .  Chlorhexidine Gluconate Cloth  6 each Topical Daily  . DULoxetine  30 mg Oral BID  . feeding supplement  237 mL Oral BID BM  . fentaNYL  1 patch Transdermal Q72H  . folic acid  1 mg Oral Daily  . gabapentin  300 mg Oral TID  . heparin  5,000 Units Subcutaneous Q8H  . mouth rinse  15 mL Mouth Rinse q12n4p  . pantoprazole  40 mg Oral Daily  . sodium chloride flush  10-40 mL Intracatheter Q12H   Continuous Infusions:   LOS: 8 days   Kerney Elbe, DO [facility]  PAGER is on AMION  If 7PM-7AM, please contact night-coverage [email or web address]

## 2020-07-05 NOTE — Progress Notes (Signed)
[facility] ROUNDING NOTE   Subjective:   Brief history.  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  He also has a remote history of Hodgkin's lymphoma. He had acute on chronic renal dysfunction with severe metabolic acidosis and altered mental status.  He was started on CRRT therapy 06/27/2020-06/30/2020.  Have discontinued CRRT therapy and monitoring renal function   Pt seen in room, no new c/o's today.  UOP yest 1300 cc.  Creat 3.8 today, slight increase.  Hb 6.7. WBC 3K    Objective:  Vital signs in last 24 hours:  Temp:  [97.6 F (36.4 C)-99.7 F (37.6 C)] 99.7 F (37.6 C) (04/06 0807) Pulse Rate:  [56-79] 73 (04/06 0807) Resp:  [16] 16 (04/06 0807) BP: (111-126)/(68-72) 120/71 (04/06 0807) SpO2:  [98 %-100 %] 100 % (04/06 0807)  Weight change:  Filed Weights   07/02/20 0500 07/03/20 0400 07/04/20 0819  Weight: 62.9 kg 62.3 kg 63.3 kg    Intake/Output: I/O last 3 completed shifts: In: 1822 [P.O.:200; I.V.:1596.7; IV Piggyback:25.4] Out: 4450 [Urine:3600; Stool:850]   Intake/Output this shift:  No intake/output data recorded.  Exam: Constitutional: sitting up in chair eating breakfast, no distress ENMT: ears and nose without scars or lesions, MMM CV: normal rate, no edema Respiratory: clear to auscultation, normal work of breathing Gastrointestinal: soft, non-tender, no palpable masses or hernias, +colostomy Skin: no visible lesions or rashes Neuro: nonfocal, Ox 3, conversant   Assessment/ Plan:  1. AKI on CKD 5 - b/l creat around 5.  Multiple factors leading to acute injury.  Admitted with altered mental status severe metabolic acidosis.  SP CRRT 3/29- 06/30/20. Creat up slightly to 3.8 today, no signs of uremia. Good UOP.  Pt is significantly less deconditioned here now then on prior admits last year. Pt would like to try HD if needed. He knows that he may or may not tolerate it well in the long-term setting. No indication at this time. Have dc'd IVF"s, he is eating and drinking well.  Creat appears to be leveling off. Will follow.  2. Hypokalemia- replete prn  3. Metabolic acidosis- appears to have resolved. 4. Chronic kidney disease-  with obstructive uropathy, urology following 5. Sepsis - sp 7d course of IV abx.  6. Anemia ckd + other -  transfuse as needed if Hb < 7.0. 7. Urinary retention - urology following, may be due to BPH.  Has indwelling foley.  8. Bilat hydronephrosis- w/ bilat ureteral stents.  Per urology will need stent exchange soon.    Kelly Splinter, MD 07/05/2020, 8:30 AM   Basic Metabolic Panel: Recent Labs  Lab 07/01/20 0430 07/01/20 2034 07/02/20 0500 07/03/20 0500 07/04/20 0627 07/04/20 1142 07/05/20 0501  NA 140 138 138 135  --  139 139  K 3.3* 4.0 4.1 3.9  --  3.6 3.6  CL 108 107 107 106  --  111 112*  CO2 23 25 25 24   --  23 21*  GLUCOSE 91 110* 111* 99  --  97 86  BUN 14 17 18 20   --  22* 22*  CREATININE 2.01* 2.53* 2.89* 3.20*  --  3.69* 3.85*  CALCIUM 7.3* 7.1* 7.4* 7.1*  --  7.0* 6.9*  MG 2.5*  --  2.2 1.9 1.8  --  1.7  PHOS 2.9  2.9 2.7 2.7  --   --  3.5 3.7   CBC: Recent Labs  Lab 06/30/20 0430 07/01/20 0430 07/03/20 0500 07/04/20 1142 07/05/20 0501  WBC 9.3 7.9 4.0 3.7* 3.1*  NEUTROABS  --   --   --  1.8  --   HGB 8.0* 7.5* 7.4* 7.3* 6.7*  HCT 23.1* 23.1* 23.4* 23.5* 22.2*  MCV 90.2 93.9 97.5 99.2 102.3*  PLT 103* 80* 99* 130* 130*      Medications:   . dextrose 5% lactated ringers 100 mL/hr at 07/05/20 0548   . (feeding supplement) PROSource Plus  30 mL Oral BID BM  . chlorhexidine  15 mL Mouth Rinse BID  . Chlorhexidine Gluconate Cloth  6 each Topical Daily  . DULoxetine  30 mg Oral BID  . feeding supplement  237 mL Oral BID BM  . fentaNYL  1  patch Transdermal Q72H  . folic acid  1 mg Oral Daily  . gabapentin  300 mg Oral TID  . heparin  5,000 Units Subcutaneous Q8H  . mouth rinse  15 mL Mouth Rinse q12n4p  . pantoprazole  40 mg Oral Daily  . sodium chloride flush  10-40 mL Intracatheter Q12H   acetaminophen, HYDROmorphone (DILAUDID) injection, oxyCODONE, polyethylene glycol, sodium chloride flush    Kelly Splinter, MD 07/05/2020, 8:30 AM

## 2020-07-05 NOTE — Progress Notes (Signed)
Patient ID: Chase [name], male   DOB: 1965/05/08, 55 y.o.   MRN: [id number]    Subjective: Pt continues to improve.  No complaints.  Eating well.  S/P transfusion of PRBCs yesterday due to anemia.  Appropriate increase in Hgb.  Objective: Vital signs in last 24 hours: Temp:  [97.8 F (36.6 C)-99.7 F (37.6 C)] 98.3 F (36.8 C) (04/06 1314) Pulse Rate:  [56-73] 64 (04/06 1314) Resp:  [14-16] 14 (04/06 1314) BP: (110-126)/(68-75) 122/73 (04/06 1314) SpO2:  [97 %-100 %] 99 % (04/06 1314)  Intake/Output from previous day: 04/05 0701 - 04/06 0700 In: 574.9 [I.V.:574.9] Out: 3050 [Urine:2650; Stool:400] Intake/Output this shift: Total I/O In: 735 [P.O.:420; Blood:315] Out: 1000 [Urine:800; Stool:200]  Physical Exam:  General: Alert and oriented Abdomen: Soft, ND GU: Urine clear   Lab Results: Recent Labs    07/04/20 1142 07/05/20 0501 07/05/20 1554  HGB 7.3* 6.7* 9.4*  HCT 23.5* 22.2* 29.6*   BMET Recent Labs    07/04/20 1142 07/05/20 0501  NA 139 139  K 3.6 3.6  CL 111 112*  CO2 23 21*  GLUCOSE 97 86  BUN 22* 22*  CREATININE 3.69* 3.85*  CALCIUM 7.0* 6.9*     Studies/Results: No results found.  Assessment/Plan: 1) Bilateral hydronephrosis: He has chronic indwelling stents last changed 2.5 months ago.  Considering recent acute illness, will likely need to simply change his ureteral stents after about 3 months.  Discussed with primary team and will plan to change stents tomorrow.  2) Urinary retention: He does have evidence of detrusor function on recent urodynamics and appears to have bladder outlet obstruction most likely from BPH. Would be reasonable to restart tamsulosin (he was on this previously) and consider voiding trial once renal function has stabilized.    LOS: 8 days   [name] 07/05/2020, 5:19 PM

## 2020-07-06 ENCOUNTER — Inpatient Hospital Stay (HOSPITAL_COMMUNITY): Payer: No Typology Code available for payment source

## 2020-07-06 ENCOUNTER — Encounter (HOSPITAL_COMMUNITY): Payer: Self-pay | Admitting: Pulmonary Disease

## 2020-07-06 ENCOUNTER — Ambulatory Visit: Payer: No Typology Code available for payment source | Admitting: Neurology

## 2020-07-06 ENCOUNTER — Inpatient Hospital Stay (HOSPITAL_COMMUNITY): Payer: No Typology Code available for payment source | Admitting: Certified Registered Nurse Anesthetist

## 2020-07-06 ENCOUNTER — Encounter (HOSPITAL_COMMUNITY)
Admission: EM | Disposition: A | Discharge status: Home Health | Payer: Self-pay | Source: Home / Self Care | Attending: Emergency Medicine

## 2020-07-06 DIAGNOSIS — R338 Other retention of urine: Secondary | ICD-10-CM | POA: Diagnosis not present

## 2020-07-06 DIAGNOSIS — G9341 Metabolic encephalopathy: Secondary | ICD-10-CM | POA: Diagnosis not present

## 2020-07-06 DIAGNOSIS — D638 Anemia in other chronic diseases classified elsewhere: Secondary | ICD-10-CM | POA: Diagnosis not present

## 2020-07-06 DIAGNOSIS — N179 Acute kidney failure, unspecified: Secondary | ICD-10-CM | POA: Diagnosis not present

## 2020-07-06 HISTORY — PX: CYSTOSCOPY W/ URETERAL STENT PLACEMENT: SHX1429

## 2020-07-06 LAB — CBC WITH DIFFERENTIAL/PLATELET
Abs Immature Granulocytes: 0.07 10*3/uL (ref 0.00–0.07)
Basophils Absolute: 0 10*3/uL (ref 0.0–0.1)
Basophils Relative: 1 %
Eosinophils Absolute: 0.2 10*3/uL (ref 0.0–0.5)
Eosinophils Relative: 6 %
HCT: 28 % — ABNORMAL LOW (ref 39.0–52.0)
Hemoglobin: 8.9 g/dL — ABNORMAL LOW (ref 13.0–17.0)
Immature Granulocytes: 2 %
Lymphocytes Relative: 34 %
Lymphs Abs: 1.3 10*3/uL (ref 0.7–4.0)
MCH: 30.5 pg (ref 26.0–34.0)
MCHC: 31.8 g/dL (ref 30.0–36.0)
MCV: 95.9 fL (ref 80.0–100.0)
Monocytes Absolute: 0.5 10*3/uL (ref 0.1–1.0)
Monocytes Relative: 13 %
Neutro Abs: 1.7 10*3/uL (ref 1.7–7.7)
Neutrophils Relative %: 44 %
Platelets: 154 10*3/uL (ref 150–400)
RBC: 2.92 MIL/uL — ABNORMAL LOW (ref 4.22–5.81)
RDW: 17.2 % — ABNORMAL HIGH (ref 11.5–15.5)
WBC: 3.8 10*3/uL — ABNORMAL LOW (ref 4.0–10.5)
nRBC: 0 % (ref 0.0–0.2)

## 2020-07-06 LAB — COMPREHENSIVE METABOLIC PANEL
ALT: 14 U/L (ref 0–44)
AST: 21 U/L (ref 15–41)
Albumin: 1.7 g/dL — ABNORMAL LOW (ref 3.5–5.0)
Alkaline Phosphatase: 81 U/L (ref 38–126)
Anion gap: 8 (ref 5–15)
BUN: 25 mg/dL — ABNORMAL HIGH (ref 6–20)
CO2: 21 mmol/L — ABNORMAL LOW (ref 22–32)
Calcium: 7 mg/dL — ABNORMAL LOW (ref 8.9–10.3)
Chloride: 109 mmol/L (ref 98–111)
Creatinine, Ser: 3.98 mg/dL — ABNORMAL HIGH (ref 0.61–1.24)
GFR, Estimated: 17 mL/min — ABNORMAL LOW (ref >60)
Glucose, Bld: 78 mg/dL (ref 70–99)
Potassium: 3.9 mmol/L (ref 3.5–5.1)
Sodium: 138 mmol/L (ref 135–145)
Total Bilirubin: 0.7 mg/dL (ref 0.3–1.2)
Total Protein: 4.4 g/dL — ABNORMAL LOW (ref 6.5–8.1)

## 2020-07-06 LAB — RENAL FUNCTION PANEL
Albumin: 1.7 g/dL — ABNORMAL LOW (ref 3.5–5.0)
Anion gap: 8 (ref 5–15)
BUN: 25 mg/dL — ABNORMAL HIGH (ref 6–20)
CO2: 21 mmol/L — ABNORMAL LOW (ref 22–32)
Calcium: 7 mg/dL — ABNORMAL LOW (ref 8.9–10.3)
Chloride: 109 mmol/L (ref 98–111)
Creatinine, Ser: 4.01 mg/dL — ABNORMAL HIGH (ref 0.61–1.24)
GFR, Estimated: 17 mL/min — ABNORMAL LOW (ref >60)
Glucose, Bld: 79 mg/dL (ref 70–99)
Phosphorus: 4.1 mg/dL (ref 2.5–4.6)
Potassium: 3.8 mmol/L (ref 3.5–5.1)
Sodium: 138 mmol/L (ref 135–145)

## 2020-07-06 LAB — GLUCOSE, CAPILLARY
Glucose-Capillary: 118 mg/dL — ABNORMAL HIGH (ref 70–99)
Glucose-Capillary: 91 mg/dL (ref 70–99)

## 2020-07-06 LAB — TYPE AND SCREEN
ABO/RH(D): O POS
Antibody Screen: NEGATIVE
Unit division: 0
Unit division: 0

## 2020-07-06 LAB — BPAM RBC
Blood Product Expiration Date: 202204122359
Blood Product Expiration Date: 202205012359
ISSUE DATE / TIME: 202204060750
ISSUE DATE / TIME: 202204061020
Unit Type and Rh: 5100
Unit Type and Rh: 5100

## 2020-07-06 LAB — MAGNESIUM: Magnesium: 1.7 mg/dL (ref 1.7–2.4)

## 2020-07-06 LAB — PHOSPHORUS: Phosphorus: 4.2 mg/dL (ref 2.5–4.6)

## 2020-07-06 SURGERY — CYSTOSCOPY, FLEXIBLE, WITH STENT REPLACEMENT
Anesthesia: General | Laterality: Bilateral

## 2020-07-06 MED ORDER — IOHEXOL 300 MG/ML  SOLN
INTRAMUSCULAR | Status: DC | PRN
  Administered 2020-07-06: 26 mL

## 2020-07-06 MED ORDER — OXYCODONE HCL 5 MG/5ML PO SOLN: 5.0000 mg | Freq: Once | ORAL | Status: DC | PRN

## 2020-07-06 MED ORDER — HYDROMORPHONE HCL 1 MG/ML IJ SOLN
INTRAMUSCULAR | Status: AC
  Administered 2020-07-06: 0.25 mg via INTRAVENOUS
  Filled 2020-07-06: qty 1

## 2020-07-06 MED ORDER — OXYCODONE HCL 5 MG PO TABS
5.0000 mg | ORAL_TABLET | Freq: Once | ORAL | Status: DC | PRN
Start: 2020-07-06 — End: 2020-07-06

## 2020-07-06 MED ORDER — LIDOCAINE 2% (20 MG/ML) 5 ML SYRINGE
INTRAMUSCULAR | Status: AC
  Filled 2020-07-06: qty 5

## 2020-07-06 MED ORDER — LACTATED RINGERS IV SOLN: INTRAVENOUS | Status: DC | PRN

## 2020-07-06 MED ORDER — FENTANYL CITRATE (PF) 100 MCG/2ML IJ SOLN
INTRAMUSCULAR | Status: DC | PRN
  Administered 2020-07-06: 100 ug via INTRAVENOUS

## 2020-07-06 MED ORDER — ONDANSETRON HCL 4 MG/2ML IJ SOLN
INTRAMUSCULAR | Status: AC
  Filled 2020-07-06: qty 2

## 2020-07-06 MED ORDER — ONDANSETRON HCL 4 MG/2ML IJ SOLN: 4.0000 mg | Freq: Four times a day (QID) | INTRAMUSCULAR | Status: DC | PRN

## 2020-07-06 MED ORDER — ONDANSETRON HCL 4 MG/2ML IJ SOLN
INTRAMUSCULAR | Status: DC | PRN
  Administered 2020-07-06: 4 mg via INTRAVENOUS

## 2020-07-06 MED ORDER — PHENYLEPHRINE 40 MCG/ML (10ML) SYRINGE FOR IV PUSH (FOR BLOOD PRESSURE SUPPORT)
PREFILLED_SYRINGE | INTRAVENOUS | Status: AC
  Filled 2020-07-06: qty 10

## 2020-07-06 MED ORDER — SODIUM CHLORIDE 0.9 % IR SOLN
Status: DC | PRN
  Administered 2020-07-06: 3000 mL via INTRAVESICAL

## 2020-07-06 MED ORDER — HYDROMORPHONE HCL 1 MG/ML IJ SOLN: 0.2500 mg | INTRAMUSCULAR | Status: DC | PRN

## 2020-07-06 MED ORDER — HYDROXYZINE HCL 10 MG PO TABS
10.0000 mg | ORAL_TABLET | Freq: Three times a day (TID) | ORAL | Status: DC | PRN
  Administered 2020-07-06 – 2020-07-07 (×2): 10 mg via ORAL
  Filled 2020-07-06 (×3): qty 1

## 2020-07-06 MED ORDER — FENTANYL CITRATE (PF) 100 MCG/2ML IJ SOLN
INTRAMUSCULAR | Status: AC
  Filled 2020-07-06: qty 2

## 2020-07-06 MED ORDER — FENTANYL CITRATE (PF) 100 MCG/2ML IJ SOLN: 25.0000 ug | INTRAMUSCULAR | Status: DC | PRN

## 2020-07-06 MED ORDER — EPHEDRINE SULFATE-NACL 50-0.9 MG/10ML-% IV SOSY
PREFILLED_SYRINGE | INTRAVENOUS | Status: DC | PRN
  Administered 2020-07-06: 15 mg via INTRAVENOUS

## 2020-07-06 MED ORDER — PROPOFOL 10 MG/ML IV BOLUS
INTRAVENOUS | Status: DC | PRN
  Administered 2020-07-06: 150 mg via INTRAVENOUS

## 2020-07-06 MED ORDER — LIP MEDEX EX OINT
TOPICAL_OINTMENT | CUTANEOUS | Status: AC
  Filled 2020-07-06: qty 7

## 2020-07-06 MED ORDER — DEXTROSE 5 % IV SOLN
INTRAVENOUS | Status: DC | PRN
  Administered 2020-07-06: 2 g via INTRAVENOUS

## 2020-07-06 MED ORDER — SODIUM CHLORIDE 0.9 % IV SOLN
INTRAVENOUS | Status: AC
  Filled 2020-07-06: qty 20

## 2020-07-06 MED ORDER — LIDOCAINE 2% (20 MG/ML) 5 ML SYRINGE
INTRAMUSCULAR | Status: DC | PRN
  Administered 2020-07-06: 80 mg via INTRAVENOUS

## 2020-07-06 MED ORDER — PRAZOSIN HCL 1 MG PO CAPS
1.0000 mg | ORAL_CAPSULE | Freq: Every day | ORAL | Status: DC
  Administered 2020-07-06 – 2020-07-07 (×2): 1 mg via ORAL
  Filled 2020-07-06 (×2): qty 1

## 2020-07-06 MED ORDER — PROPOFOL 10 MG/ML IV BOLUS
INTRAVENOUS | Status: AC
  Filled 2020-07-06: qty 20

## 2020-07-06 SURGICAL SUPPLY — 15 items
BAG URO CATCHER STRL LF (MISCELLANEOUS) ×2 IMPLANT
CATH FOLEY 2WAY SLVR  5CC 16FR (CATHETERS) ×2
CATH FOLEY 2WAY SLVR 5CC 16FR (CATHETERS) IMPLANT
CATH INTERMIT  6FR 70CM (CATHETERS) ×2 IMPLANT
CLOTH BEACON ORANGE TIMEOUT ST (SAFETY) ×2 IMPLANT
GLOVE SURG ENC TEXT LTX SZ7.5 (GLOVE) ×2 IMPLANT
GOWN STRL REUS W/TWL LRG LVL3 (GOWN DISPOSABLE) ×4 IMPLANT
GUIDEWIRE STR DUAL SENSOR (WIRE) ×2 IMPLANT
GUIDEWIRE ZIPWRE .038 STRAIGHT (WIRE) ×1 IMPLANT
KIT TURNOVER KIT A (KITS) ×2 IMPLANT
MANIFOLD NEPTUNE II (INSTRUMENTS) ×2 IMPLANT
PACK CYSTO (CUSTOM PROCEDURE TRAY) ×2 IMPLANT
STENT URET 6FRX26 CONTOUR (STENTS) ×2 IMPLANT
TUBING CONNECTING 10 (TUBING) ×2 IMPLANT
TUBING UROLOGY SET (TUBING) ×1 IMPLANT

## 2020-07-06 NOTE — Anesthesia Preprocedure Evaluation (Signed)
Anesthesia Evaluation  Patient identified by MRN, date of birth, ID band Patient awake    Reviewed: Allergy & Precautions, H&P , NPO status , Patient's Chart, lab work & pertinent test results  Airway Mallampati: II   Neck ROM: full    Dental   Pulmonary    breath sounds clear to auscultation       Cardiovascular  Rhythm:regular Rate:Normal  TTE (06/29/2018): EF 60%   Neuro/Psych PSYCHIATRIC DISORDERS Anxiety Depression CVA    GI/Hepatic GERD  ,H/o gastric bypass   Endo/Other    Renal/GU Renal InsufficiencyRenal diseaseRecent CVVH for acidosis     Musculoskeletal   Abdominal   Peds  Hematology  (+) Blood dyscrasia, anemia , Hgb 8.9 H/o hodgkin's lymphoma   Anesthesia Other Findings   Reproductive/Obstetrics                             Anesthesia Physical Anesthesia Plan  ASA: III  Anesthesia Plan: General   Post-op Pain Management:    Induction: Intravenous  PONV Risk Score and Plan: 2 and Ondansetron, Dexamethasone and Treatment may vary due to age or medical condition  Airway Management Planned: LMA  Additional Equipment:   Intra-op Plan:   Post-operative Plan: Extubation in OR  Informed Consent: I have reviewed the patients History and Physical, chart, labs and discussed the procedure including the risks, benefits and alternatives for the proposed anesthesia with the patient or authorized representative who has indicated his/her understanding and acceptance.     Dental advisory given  Plan Discussed with: CRNA, Anesthesiologist and Surgeon  Anesthesia Plan Comments:         Anesthesia Quick Evaluation

## 2020-07-06 NOTE — Progress Notes (Signed)
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  Patient had been admitted in April 05, 2020 with acute on chronic renal failure related to obstructive nephropathy after his nephrostomy tubes were removed.  He had internal ureteral stents replaced at that time and a diverting loop colostomy was also performed for the fistula.  He was placed on high-dose Neurontin and narcotics for chronic pain and history of PTSD.  Subsequently he was discharged to the hospital and on 06/27/2020 he was found on the floor by his son of unclear downtime.  His dressing will admit date 06/27/2018 when he was evaluated by EMS after a fall with question of narcotic overdose.  He is not brought to the ED at that time but then when his son found him on the floor his brought [facility] the [facility] and found to have a profound metabolic acidosis with acute on chronic renal failure, elevated CK, relative hypotension, mild transaminitis.  He is started on IV bicarbonate, IV fluid resuscitation and received IV ceftriaxone and was escalated to IV vancomycin and meropenem and completed 7 days of IV meropenem.  Subsequently he ended up requiring CRRT while in the ICU and then transferred out of the ICU on 07/04/2020 to [facility] service.  Currently the patient is improved from a mentation standpoint and  underwent bilateral stent exchange today by urology.  His renal function continues to climb after his CRRT but his baseline creatinine is between 4-4.5.  PT and OT still recommending home health  Assessment & Plan:   Active Problems:   Shock ([place])   Kidney failure   Pressure injury of skin   Delirium   Sepsis with acute renal failure (HCC)   Anemia of chronic disease   Acute urinary retention   Bilateral hydronephrosis   Acute metabolic encephalopathy   Acute kidney injury superimposed on chronic kidney disease (HCC)  Severe anion gap metabolic acidosis  -Likely secondary to acute kidney injury on chronic kidney disease stage V. -Status post CRRT therapy (3/29-4/1). -Had a mild acidosis today with a CO2 of 21, anion gap of 8, chloride level 109 -Continue monitor and trend and repeat CMP in a.m.  Acute on chronic kidney disease stage V with history of urinary obstruction post bilateral nephrostomy (04/2020) Probable Rhabdomyolyiss  -Likely multifactorial leading to acute kidney injury/ATN from sepsis and possible rhabdomyolysis superimposed. -Status post CRRT therapy (3/29-4/1). -Patient is BUN/creatinine improved although it is 16/1.42 has steadily trended up and worsening and yesterday it was 22/3.85 and today it is 25/4.01; Baseline Cr was 4-4.5 as an outpatient -It is noted that patient with prior history of acute kidney injury with temporary HD. -Nephrology following and feel at this time patient may be considered as a long-term HD candidate if needed. -Nephrology recommended transfusing 2 units of PRBCs and feel that he has no signs of uremia and he continues a good urine output. -CK was elevated and trended down  to 1233 on 06/28/20 -Per nephrology the patient would like to try hemodialysis if needed and that after discussion with the patient that the patient may or may not tolerate it well in the long-term setting -Nephrology has discontinued his IV fluids as the patient is  eating and drinking well and they feel that his creatinine leveling off and we will continue to follow conservatively  Acute Metabolic Encephalopathy, improved -Likely secondary to above in the setting of uremia -Currently improved. -Patient currently at baseline.  Resolved. -Continue monitor and trend carefully  Acute Respiratory Failure with Hypoxia -Improved and Resolved -SpO2: 97 % O2 Flow Rate (L/min): 8 L/min; No Longer on Supplemental O2 via [place] and saturating well -Continue to Monitor and Trend Respiratory Status   Septic shock, improved -??  Etiology but possibly the Urine.  Patient with component of hypovolemia likely also leading to shock. -2D echo obtained with normal EF,NWMA. -Patient was on stress dose steroids which have subsequently been discontinued. -Blood cultures with no growth to date. -Alysis showed a turbid appearance with moderate hemoglobin, 5 ketones, moderate leukocytes, negative nitrites, many bacteria, 11-29 squamous epithelial cells, 6-10 RBCs per high-power field, greater than 50 WBCs; unfortunately urine culture was never obtained on admission -Was empirically on IV vancomycin and IV Meropenem. -IV vancomycin discontinued. -Had a Colovesical Fistula that spontaneously closed up.  -Continue IV Meropenem and discontinue after today's doses to complete a 7-day course. -Continue to follow WBC and temperature curve -Patient has 650 mg acetaminophen every 6 hours as needed for Fever  Chronic Pain Syndrome  -On Opiate/Fentanyl patch 25 mcg/h 1 patch transdermal every 72 hours -Continue duloxetine 30 mg p.o. twice daily and gabapentin 300 mg p.o. 3 times daily. -Patient also has Oxycodone 5 to 10 mg p.o. every 4 hours as needed for moderate and severe pain for breakthrough pain  Bilateral Hydronephrosis  -Patient with chronic indwelling stents last changed about 2-1/2 months ago.  Patient being followed by urology. -Urology recommending change of stents  later this week and this was done today by Dr. Raynelle Bring -Further care per urology  Urinary Retention -Continue urethral catheter for now.   -Urology feels that he does have evidence of detrusor function on recent urodynamics and appears to have bladder outlet obstruction most likely from BPH -Per Urology they are going to assess the patient for restarting alpha-blocker therapy and voiding trial once stabilization of his renal function occurs  Abnormal LFTs/Mild Transaminitis -Likely secondary to septic shock. -Improved and on last check normalized with an AST of 21 and ALT of 14 -Continue to Monitor and Trend -Repeat CMP in the AM   Anxiety and Depression/PTSD  -Resume prazosin and hydroxyzine  GERD -Continue with Pantoprazole 40 mg p.o. daily  History of diverting colostomy/colovesicular fistula -Continue colostomy care with WOC nurse   Severe Protein Calorie Malnutrition in the context of Chronic illness -Nutritionist consulted for further evaluation and recommendations -Continue Nutritional Supplementation with Ensure Enlive BID, 30 mL Prosource Plus BID and 1 Tab MVI with Minerals/Day   Coronary Artery Disease -Currently Aspirin, Plavix, statin on hold.  -Likely resume medications in the next 24 to 48 hours but will hold off for now and discuss with urology when to resume  Anemia of Chronic Disease/Folate Deficiency -Hemoglobin currently stable at 7.3. -Anemia panel was done and showed an Iron level of 83, UIBC of 42, TIBC of 125, saturation ratios of 66%, ferritin level of 284, folate level 4.0, and vitamin B14 782 -Start folic acid  1 mg daily. -Transfusion threshold hemoglobin < 7. -Patient's hemoglobin/hematocrit fell from 7.3/23.5 and was 6.7/22.2 yesterday morning and after 2 units improved to 9.4/29.6 and today it is 8.9/20.0 -We will type and screen and transfuse 2 units of PRBCs -Continue to monitor for signs and symptoms of bleeding; currently no overt  bleeding noted  -Repeat CBC in the morning  Pancytopenia -Patient's WBC is now gone from 3.1 and is 3.8 -Patient's Hgb/Hct is now gone from 6.7/22.2 and is 8.9/20.0 after 2 units -Platelet Count has improved and gone from 130 and is 154 -Continue to Monitor for S/Sx of Bleeding; Getting 2 units of pRBCs today -Repeat CBC in the AM  Hypoalbuminemia -Patient's Albumin is 1.7 -Supplementation as above   Generalized Weakness and Profound Deconditioning/Debility -PT/OT consulted and recommending Home Health PT vs SNF but still recommending home health now  Pressure Injury, POA Pressure Injury 11/08/19 Coccyx Upper Stage 2 -  Partial thickness loss of dermis presenting as a shallow open injury with a red, pink wound bed without slough. unblanchable with an open slit in the middle non draining  (Active)  11/08/19 0300  Location: Coccyx  Location Orientation: Upper  Staging: Stage 2 -  Partial thickness loss of dermis presenting as a shallow open injury with a red, pink wound bed without slough.  Wound Description (Comments): unblanchable with an open slit in the middle non draining   Present on Admission: Yes     Pressure Injury 06/27/20 Coccyx Mid Stage 2 -  Partial thickness loss of dermis presenting as a shallow open injury with a red, pink wound bed without slough. pink, red (Active)  06/27/20 1800  Location: Coccyx  Location Orientation: Mid  Staging: Stage 2 -  Partial thickness loss of dermis presenting as a shallow open injury with a red, pink wound bed without slough.  Wound Description (Comments): pink, red  Present on Admission: Yes   DVT prophylaxis: SCDs, heparin 5000 units subcu every 8 hours Code Status: DO NOT RESUSCITATE Family Communication: No family present at bedside but I updated his son [name] via telephone yesterday Disposition Plan: Pending further clinical improvement and clearance by Nephrology and Urology   Status is: Inpatient  Remains inpatient  appropriate because:Unsafe d/c plan, IV treatments appropriate due to intensity of illness or inability to take PO and Inpatient level of care appropriate due to severity of illness   Dispo: The patient is from: Home              Anticipated d/c is to: TBD              Patient currently is not medically stable to d/c.   Difficult to place patient No  Consultants:   PCCM Transfer  Nephrology   Urology   Significant Events/Procedures:  Significant Hospital Events: Including procedures, antibiotic start and stop dates in addition to other pertinent events    UDS 3/29 >> negative  CT abdomen/pelvis 3/29 >> bilateral renal atrophy, bilateral ureteral stents are noted in good position without any hydronephrosis.  Foley catheter present.  History gastric surgery with left lower quadrant colostomy.  Renal ultrasound 3/29 >> stable moderate bilateral hydroureteronephrosis, right ureteral stent extending to the renal pelvis, left ureteral stent faintly visualized.  Echogenic focus in the left kidney question nonobstructing stone or air bubble  CT pelvis/left hip 3/29 >> no evidence of hip fracture or dislocation normal joint space, decreased offset of the bilateral femoral head neck junctions without suspicious bony lesion  Chest  x-ray 3/29 >> lungs clear, no infiltrates  Head CT 3/29 >> no acute intracranial abnormality, old right occipital lobe infarct  Blood cultures 3/29 ([facility]) >> neg   CVVHD started 3/29 with significant improvement in severe metabolic acidosis  Developed shock 3/29 when CVVH started, on norepinephrine  NG tube attempted 3/30, unsuccessful  Echocardiogram 3/30 >> LVEF 60-65% without regional wall motion abnormality, no diastolic dysfunction.  Normal RV size and function, normal PASP.  No mention of valvular abnormality  4/1 d/c'd cvvh  Physical therapy/ mobilization started   AM Cortisol 4/4 >>>  Antimicrobials:  Anti-infectives (From admission, onward)    Start     Dose/Rate Route Frequency Ordered Stop   07/06/20 1109  sodium chloride 0.9 % with cefTRIAXone (ROCEPHIN) ADS Med       Note to Pharmacy: [place]  : cabinet override      07/06/20 1109 07/06/20 2314   07/02/20 1800  meropenem (MERREM) 500 mg in sodium chloride 0.9 % 100 mL IVPB        500 mg 200 mL/hr over 30 Minutes Intravenous Every 12 hours 07/02/20 0714 07/04/20 1818   07/01/20 0600  meropenem (MERREM) 1 g in sodium chloride 0.9 % 100 mL IVPB  Status:  Discontinued        1 g 200 mL/hr over 30 Minutes Intravenous Every 12 hours 06/30/20 2005 07/02/20 0714   06/29/20 1700  vancomycin (VANCOREADY) IVPB 750 mg/150 mL  Status:  Discontinued        750 mg 150 mL/hr over 60 Minutes Intravenous Every 24 hours 06/28/20 1536 06/30/20 0854   06/28/20 1700  vancomycin (VANCOREADY) IVPB 1000 mg/200 mL        1,000 mg 200 mL/hr over 60 Minutes Intravenous  Once 06/28/20 1536 06/28/20 1841   06/28/20 1600  meropenem (MERREM) 1 g in sodium chloride 0.9 % 100 mL IVPB  Status:  Discontinued        1 g 200 mL/hr over 30 Minutes Intravenous Every 8 hours 06/28/20 1533 06/30/20 2005   06/27/20 1930  cefTRIAXone (ROCEPHIN) 1 g in sodium chloride 0.9 % 100 mL IVPB  Status:  Discontinued        1 g 200 mL/hr over 30 Minutes Intravenous Daily-1800 06/27/20 1839 06/28/20 1524   06/27/20 1515  cefTRIAXone (ROCEPHIN) 2 g in sodium chloride 0.9 % 100 mL IVPB        2 g 200 mL/hr over 30 Minutes Intravenous  Once 06/27/20 1506 06/27/20 1551        Subjective: Seen and examined at bedside after his stent exchange and he is much more awake and alert.  Complaining of significant anxiety and states that he had nightmares and requesting his prazosin his Vistaril be restarted.  He states that he had some back pain but this was relieved by oxycodone.  About to eat his dinner.  No other concerns or complaints at this time.  Objective: Vitals:   07/06/20 1233 07/06/20 1241 07/06/20 1245 07/06/20  1401  BP:   116/68 115/70  Pulse: 62  62 (!) 59  Resp: 13  12 16   Temp:  97.7 F (36.5 C)  (!) 97.4 F (36.3 C)  TempSrc:      SpO2: 100%  100% 97%  Weight:      Height:        Intake/Output Summary (Last 24 hours) at 07/06/2020 1811 Last data filed at 07/06/2020 1645 Gross per 24 hour  Intake 100 ml  Output  3775 ml  Net -3675 ml   Filed Weights   07/03/20 0400 07/04/20 0819 07/06/20 0521  Weight: 62.3 kg 63.3 kg 61.7 kg   Examination: Physical Exam:  Constitutional: The patient is a thin chronically ill-appearing Caucasian male currently in no acute distress appears calm and about to eat his meal Eyes: Lids and conjunctivae normal, sclerae anicteric  ENMT: External Ears, Nose appear normal. Grossly normal hearing.  Neck: Appears normal, supple, no cervical masses, normal ROM, no appreciable thyromegaly; no JVD Respiratory: Diminished to auscultation bilaterally, no wheezing, rales, rhonchi or crackles. Normal respiratory effort and patient is not tachypenic. No accessory muscle use.  Cardiovascular: RRR, no murmurs / rubs / gallops. S1 and S2 auscultated.  Trace extremity edema Abdomen: Soft, non-tender, non-distended.  Colostomy bag in place with air and liquid stool.  Bowel sounds positive.  GU: Deferred. Musculoskeletal: No clubbing / cyanosis of digits/nails. No joint deformity upper and lower extremities.  Skin: No rashes, lesions, ulcers on limited skin evaluation but does have multiple tattoos on his body. No induration; Warm and dry.  Neurologic: CN 2-12 grossly intact with no focal deficits. Romberg sign and cerebellar reflexes not assessed.  Psychiatric: Normal judgment and insight. Alert and oriented x 3. Normal mood and appropriate affect.   Data Reviewed: I have personally reviewed following labs and imaging studies  CBC: Recent Labs  Lab 07/01/20 0430 07/03/20 0500 07/04/20 1142 07/05/20 0501 07/05/20 1554 07/06/20 0456  WBC 7.9 4.0 3.7* 3.1*  --  3.8*   NEUTROABS  --   --  1.8  --   --  1.7  HGB 7.5* 7.4* 7.3* 6.7* 9.4* 8.9*  HCT 23.1* 23.4* 23.5* 22.2* 29.6* 28.0*  MCV 93.9 97.5 99.2 102.3*  --  95.9  PLT 80* 99* 130* 130*  --  924   Basic Metabolic Panel: Recent Labs  Lab 07/01/20 2034 07/02/20 0500 07/03/20 0500 07/04/20 0627 07/04/20 1142 07/05/20 0501 07/06/20 0456  NA 138 138 135  --  139 139 138  138  K 4.0 4.1 3.9  --  3.6 3.6 3.9  3.8  CL 107 107 106  --  111 112* 109  109  CO2 25 25 24   --  23 21* 21*  21*  GLUCOSE 110* 111* 99  --  97 86 78  79  BUN 17 18 20   --  22* 22* 25*  25*  CREATININE 2.53* 2.89* 3.20*  --  3.69* 3.85* 3.98*  4.01*  CALCIUM 7.1* 7.4* 7.1*  --  7.0* 6.9* 7.0*  7.0*  MG  --  2.2 1.9 1.8  --  1.7 1.7  PHOS 2.7 2.7  --   --  3.5 3.7 4.2  4.1   GFR: Estimated Creatinine Clearance: 18 mL/min (A) (by C-G formula based on SCr of 4.01 mg/dL (H)). Liver Function Tests: Recent Labs  Lab 06/30/20 0430 06/30/20 1615 07/02/20 0500 07/03/20 0500 07/04/20 1142 07/05/20 0501 07/06/20 0456  AST 80*  --   --  22  --   --  21  ALT 63*  --   --  27  --   --  14  ALKPHOS 108  --   --  85  --   --  81  BILITOT 1.3*  --   --  0.4  --   --  0.7  PROT 5.0*  --   --  4.3*  --   --  4.4*  ALBUMIN 2.1*  2.0*   < >  1.8* 1.7* 1.8* 1.7* 1.7*  1.7*   < > = values in this interval not displayed.   No results for input(s): LIPASE, AMYLASE in the last 168 hours. No results for input(s): AMMONIA in the last 168 hours. Coagulation Profile: No results for input(s): INR, PROTIME in the last 168 hours. Cardiac Enzymes: No results for input(s): CKTOTAL, CKMB, CKMBINDEX, TROPONINI in the last 168 hours. BNP (last 3 results) No results for input(s): PROBNP in the last 8760 hours. HbA1C: No results for input(s): HGBA1C in the last 72 hours. CBG: Recent Labs  Lab 07/02/20 1939 07/03/20 0042 07/03/20 0433 07/03/20 0727 07/03/20 1136  GLUCAP 107* 106* 101* 86 111*   Lipid Profile: No results for  input(s): CHOL, HDL, LDLCALC, TRIG, CHOLHDL, LDLDIRECT in the last 72 hours. Thyroid Function Tests: No results for input(s): TSH, T4TOTAL, FREET4, T3FREE, THYROIDAB in the last 72 hours. Anemia Panel: Recent Labs    07/04/20 1142 07/04/20 1143  VITAMINB12  --  619  FOLATE 4.0*  --   FERRITIN  --  284  TIBC  --  125*  IRON  --  83  RETICCTPCT 2.0  --    Sepsis Labs: Recent Labs  Lab 06/30/20 0430  PROCALCITON 4.17    Recent Results (from the past 240 hour(s))  Culture, blood (routine x 2)     Status: None   Collection Time: 06/27/20 12:54 PM   Specimen: BLOOD  Result Value Ref Range Status   Specimen Description BLOOD BLOOD RIGHT ARM  Final   Special Requests   Final    Blood Culture adequate volume BOTTLES DRAWN AEROBIC AND ANAEROBIC   Culture   Final    NO GROWTH 5 DAYS Performed at [facility], [address], [place], [place] [address]    Report Status 07/02/2020 FINAL  Final  Culture, blood (routine x 2)     Status: None   Collection Time: 06/27/20 12:54 PM   Specimen: BLOOD  Result Value Ref Range Status   Specimen Description BLOOD BLOOD LEFT ARM  Final   Special Requests   Final    Blood Culture adequate volume BOTTLES DRAWN AEROBIC AND ANAEROBIC   Culture   Final    NO GROWTH 5 DAYS Performed at [facility], [address], [place], [place] [address]    Report Status 07/02/2020 FINAL  Final  Resp Panel by RT-PCR (Flu A&B, Covid) Nasopharyngeal Swab     Status: None   Collection Time: 06/27/20  1:25 PM   Specimen: Nasopharyngeal Swab; Nasopharyngeal(NP) swabs in vial transport medium  Result Value Ref Range Status   SARS Coronavirus 2 by RT PCR NEGATIVE NEGATIVE Final    Comment: (NOTE) SARS-CoV-2 target nucleic acids are NOT DETECTED.  The SARS-CoV-2 RNA is generally detectable in upper respiratory specimens during the acute phase of infection. The lowest concentration of SARS-CoV-2 viral copies this assay can detect is 138 copies/mL. A negative  result does not preclude SARS-Cov-2 infection and should not be used as the sole basis for treatment or other patient management decisions. A negative result may occur with  improper specimen collection/handling, submission of specimen other than nasopharyngeal swab, presence of viral mutation(s) within the areas targeted by this assay, and inadequate number of viral copies(<138 copies/mL). A negative result must be combined with clinical observations, patient history, and epidemiological information. The expected result is Negative.  Fact Sheet for Patients:  EntrepreneurPulse.com.au  Fact Sheet for Healthcare Providers:  IncredibleEmployment.be  This test is no t  yet approved or cleared by the [place] and  has been authorized for detection and/or diagnosis of SARS-CoV-2 by FDA under an Emergency Use Authorization (EUA). This EUA will remain  in effect (meaning this test can be used) for the duration of the COVID-19 declaration under Section 564(b)(1) of the Act, 21 U.S.C.section 360bbb-3(b)(1), unless the authorization is terminated  or revoked sooner.       Influenza A by PCR NEGATIVE NEGATIVE Final   Influenza B by PCR NEGATIVE NEGATIVE Final    Comment: (NOTE) The Xpert Xpress SARS-CoV-2/FLU/RSV plus assay is intended as an aid in the diagnosis of influenza from Nasopharyngeal swab specimens and should not be used as a sole basis for treatment. Nasal washings and aspirates are unacceptable for Xpert Xpress SARS-CoV-2/FLU/RSV testing.  Fact Sheet for Patients: EntrepreneurPulse.com.au  Fact Sheet for Healthcare Providers: IncredibleEmployment.be  This test is not yet approved or cleared by the [place] FDA and has been authorized for detection and/or diagnosis of SARS-CoV-2 by FDA under an Emergency Use Authorization (EUA). This EUA will remain in effect (meaning this test can be used)  for the duration of the COVID-19 declaration under Section 564(b)(1) of the Act, 21 U.S.C. section 360bbb-3(b)(1), unless the authorization is terminated or revoked.  Performed at [facility], [address], [place], [place] [address]   MRSA PCR Screening     Status: None   Collection Time: 06/27/20  6:00 PM   Specimen: Nasal Mucosa; Nasopharyngeal  Result Value Ref Range Status   MRSA by PCR NEGATIVE NEGATIVE Final    Comment:        The GeneXpert MRSA Assay (FDA approved for NASAL specimens only), is one component of a comprehensive MRSA colonization surveillance program. It is not intended to diagnose MRSA infection nor to guide or monitor treatment for MRSA infections. Performed at [facility], [place] [address], [place],  [address]      RN Pressure Injury Documentation: Pressure Injury 11/08/19 Coccyx Upper Stage 2 -  Partial thickness loss of dermis presenting as a shallow open injury with a red, pink wound bed without slough. unblanchable with an open slit in the middle non draining  (Active)  11/08/19 0300  Location: Coccyx  Location Orientation: Upper  Staging: Stage 2 -  Partial thickness loss of dermis presenting as a shallow open injury with a red, pink wound bed without slough.  Wound Description (Comments): unblanchable with an open slit in the middle non draining   Present on Admission: Yes     Pressure Injury 06/27/20 Coccyx Mid Stage 2 -  Partial thickness loss of dermis presenting as a shallow open injury with a red, pink wound bed without slough. pink, red (Active)  06/27/20 1800  Location: Coccyx  Location Orientation: Mid  Staging: Stage 2 -  Partial thickness loss of dermis presenting as a shallow open injury with a red, pink wound bed without slough.  Wound Description (Comments): pink, red  Present on Admission: Yes   Estimated body mass index is 21.95 kg/m as calculated from the following:   Height as of this encounter: 5'  6" (1.676 m).   Weight as of this encounter: 61.7 kg.  Malnutrition Type:  Nutrition Problem: Severe Malnutrition Etiology: chronic illness (stage 4-5 CKD)  Malnutrition Characteristics:  Signs/Symptoms: severe fat depletion,severe muscle depletion  Nutrition Interventions:  Interventions: Ensure Enlive (each supplement provides 350kcal and 20 grams of protein),Prostat,MVI   Radiology Studies: DG C-Arm 1-60 Min-No Report  Result  Date: 07/06/2020 Fluoroscopy was utilized by the requesting physician.  No radiographic interpretation.    Scheduled Meds: . (feeding supplement) PROSource Plus  30 mL Oral BID BM  . chlorhexidine  15 mL Mouth Rinse BID  . Chlorhexidine Gluconate Cloth  6 each Topical Daily  . DULoxetine  30 mg Oral BID  . feeding supplement  237 mL Oral BID BM  . fentaNYL  1 patch Transdermal Q72H  . folic acid  1 mg Oral Daily  . gabapentin  300 mg Oral TID  . heparin  5,000 Units Subcutaneous Q8H  . mouth rinse  15 mL Mouth Rinse q12n4p  . pantoprazole  40 mg Oral Daily  . prazosin  1 mg Oral QHS  . sodium chloride flush  10-40 mL Intracatheter Q12H   Continuous Infusions: . cefTRIAXone (ROCEPHIN) IVPB 2 gram/100 mL NS (Mini-Bag Plus)      LOS: 9 days   Kerney Elbe, DO [facility] PAGER is on AMION  If 7PM-7AM, please contact night-coverage [email or web address]

## 2020-07-06 NOTE — Anesthesia Procedure Notes (Signed)
Procedure Name: LMA Insertion Date/Time: 07/06/2020 11:29 AM Performed by: [place] Schnitker C, CRNA Pre-anesthesia Checklist: Patient identified, Emergency Drugs available, Suction available and Patient being monitored Patient Re-evaluated:Patient Re-evaluated prior to induction Oxygen Delivery Method: Circle system utilized Preoxygenation: Pre-oxygenation with 100% oxygen Induction Type: IV induction Ventilation: Mask ventilation without difficulty LMA: LMA inserted LMA Size: 4.0 Number of attempts: 1 Airway Equipment and Method: Bite block Placement Confirmation: positive ETCO2 Tube secured with: Tape Dental Injury: Teeth and Oropharynx as per pre-operative assessment

## 2020-07-06 NOTE — Op Note (Signed)
Preoperative diagnosis:  1. Bilateral hydronephrosis 2. Chronic kidney disease   Postoperative diagnosis:  1. Bilateral hydronephrosis 2. Chronic kidney disease   Procedure:  1. Cystoscopy 2. Bilateral ureteral stent placement (6 x 26 - no string) 3. Bilateral retrograde pyelography with interpretation  Surgeon: Pryor Curia. M.D.  Anesthesia: General  Complications: None  Intraoperative findings: Utilizing 6 French ureteral catheters and Omnipaque contrast, bilateral retrograde pyelograms were performed.  On the left side, there was noted to be in the area of proximal tortuosity as previously noted without obvious filling defects.  The renal pelvis was mildly dilated but without calyceal dilation or filling defects.  Confirmation of appropriate positioning of the proximal wire in the left renal pelvis was noted.  On the right side, there was noted to be increased tortuosity compared to the left particularly proximally.  However, there were no filling defects in the ureter.  No significant calyceal dilation was noted on the right side.  No filling defects noted in the right renal collecting system.  Confirmation of the wire proximally in the renal pelvis was also noted.  EBL: Minimal  Specimens: None  Indication: Chase [name] is a 55 y.o. patient with bilateral ureteral obstruction. After reviewing the management options for treatment, he elected to proceed with the above surgical procedure(s). We have discussed the potential benefits and risks of the procedure, side effects of the proposed treatment, the likelihood of the patient achieving the goals of the procedure, and any potential problems that might occur during the procedure or recuperation. Informed consent has been obtained.  Description of procedure:  The patient was taken to the operating room and general anesthesia was induced.  The patient was placed in the dorsal lithotomy position, prepped and draped in the  usual sterile fashion, and preoperative antibiotics were administered. A preoperative time-out was performed.   Cystourethroscopy was performed.  The patient's urethra was examined and was unremarkable. The bladder was then systematically examined in its entirety. There was no evidence for any bladder tumors, stones, or other mucosal pathology.    Attention then turned to the left ureteral orifice and the patient's indwelling ureteral stent was identified and brought out to the urethral meatus with the flexible graspers.  A 0.38 sensor guidewire was then advanced up the left ureter into the renal pelvis under fluoroscopic guidance.  A 6 French ureteral catheter was then advanced over the wire and Omnipaque contrast was injected to confirm appropriate placement of the guidewire.  The wire was then backloaded through the cystoscope and a ureteral stent was advance over the wire using Seldinger technique.  The stent was positioned appropriately under fluoroscopic and cystoscopic guidance.  The wire was then removed with an adequate stent curl noted in the renal pelvis as well as in the bladder.  Attention then turned to the right ureteral orifice and the patient's indwelling ureteral stent was identified and brought out to the urethral meatus with the flexible graspers.  A 0.38 sensor guidewire was then advanced up the right ureter into the renal pelvis under fluoroscopic guidance.  Similar to the other side, a 6 [place] ureteral catheter was advanced over the wire and a wire was then confirmed to be in the appropriate position in the renal collecting system.  The wire was then backloaded through the cystoscope and a ureteral stent was advance over the wire using Seldinger technique.  The stent was positioned appropriately under fluoroscopic and cystoscopic guidance.  The wire was then removed with  an adequate stent curl noted in the renal pelvis as well as in the bladder.  The bladder was then emptied and  the procedure ended.  The patient appeared to tolerate the procedure well and without complications.  The patient was able to be awakened and transferred to the recovery unit in satisfactory condition.    Pryor Curia MD

## 2020-07-06 NOTE — Transfer of Care (Signed)
Immediate Anesthesia Transfer of Care Note  Patient: Chase [name]  Procedure(s) Performed: CYSTOSCOPY WITH BILATERAL STENT EXCHANGE; BILATERAL PYELOGRAM RETROGRADE (Bilateral )  Patient Location: PACU  Anesthesia Type:General  Level of Consciousness: awake and patient cooperative  Airway & Oxygen Therapy: Patient Spontanous Breathing  Post-op Assessment: Report given to RN and Post -op Vital signs reviewed and stable  Post vital signs: Reviewed and stable  Last Vitals:  Vitals Value Taken Time  BP 115/70 07/06/20 1401  Temp 36.3 C 07/06/20 1401  Pulse 59 07/06/20 1401  Resp 12 07/06/20 1438  SpO2 97 % 07/06/20 1401  Vitals shown include unvalidated device data.  Last Pain:  Vitals:   07/06/20 1241  TempSrc:   PainSc: 5       Patients Stated Pain Goal: 2 (96/72/89 7915)  Complications: No complications documented.

## 2020-07-06 NOTE — Progress Notes (Signed)
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 He was found 3/29 on the floor by his son, unclear downtime. At  [facility] ED on 3/29 found to have a profound metabolic acidosis with acute on chronic renal failure, elevated CK, relative hypotension, mild transaminitis. CVVHD started 3/29 with significant improvement in severe metabolic acidosis.   His son reported that overall his health has been in steady decline, has difficulty with ADLs.   OT comments  Treatment focused on working on activity tolerance during self care tasks. Patient stood at sink x 4 minutes. Improving balance, mobility and ADLs today.   Follow Up Recommendations  Home health OT    Equipment Recommendations  None recommended by OT    Recommendations for Other Services      Precautions / Restrictions Precautions Precautions: Fall Precaution Comments: colostomy Restrictions Weight Bearing Restrictions: No       Mobility Bed Mobility Overal bed mobility: Modified Independent             General bed mobility comments: increased time    Transfers Overall transfer level: Needs assistance   Transfers: Sit to/from Stand;Stand Pivot Transfers Sit to Stand: Supervision Stand pivot transfers: Supervision       General transfer comment: Supervision for all mobility with RW. Slow movement due to generalized pain.    Balance Overall balance assessment: Mild deficits observed, not formally tested                                         ADL either performed or assessed with clinical judgement   ADL Overall ADL's : Needs assistance/impaired     Grooming: Wash/dry face;Wash/dry hands;Oral  care;Standing Grooming Details (indicate cue type and reason): at sink; x 4 minutes total in standing                                     Vision Patient Visual Report: No change from baseline Vision Assessment?: No apparent visual deficits   Perception     Praxis      Cognition Arousal/Alertness: Awake/alert Behavior During Therapy: WFL for tasks assessed/performed Overall Cognitive Status: Within Functional Limits for tasks assessed                                          Exercises     Shoulder Instructions       General Comments      Pertinent Vitals/ Pain       Pain Assessment: Faces Faces Pain Scale: Hurts little more Pain Location: All the joints of the body. Pain Descriptors / Indicators: Grimacing Pain Intervention(s): Limited activity within patient's tolerance;Monitored during session  Home Living                                          Prior Functioning/Environment  Frequency  Min 2X/week        Progress Toward Goals  OT Goals(current goals can now be found in the care plan section)  Progress towards OT goals: Progressing toward goals  Acute Rehab OT Goals Patient Stated Goal: To go home OT Goal Formulation: With patient  Plan Discharge plan remains appropriate    Co-evaluation                 AM-PAC OT "6 Clicks" Daily Activity     Outcome Measure   Help from another person eating meals?: None Help from another person taking care of personal grooming?: None Help from another person toileting, which includes using toliet, bedpan, or urinal?: Total (catheter, colostomy) Help from another person bathing (including washing, rinsing, drying)?: A Little Help from another person to put on and taking off regular upper body clothing?: A Little Help from another person to put on and taking off regular lower body clothing?: A Little 6 Click Score: 18    End of Session  Equipment Utilized During Treatment: Rolling walker  OT Visit Diagnosis: Unsteadiness on feet (R26.81);Pain   Activity Tolerance Patient tolerated treatment well   Patient Left in bed;with call bell/phone within reach   Nurse Communication  (okay to see)        Time: 7169-6789 OT Time Calculation (min): 15 min  Charges: OT General Charges $OT Visit: 1 Visit OT Treatments $Self Care/Home Management : 8-22 mins  Derl Barrow, OTR/L [place]  Office [phone] Pager: [place] 07/06/2020, 9:25 AM

## 2020-07-06 NOTE — Progress Notes (Signed)
PHYSICAL THERAPY   Pt just recently got back to his room from procedure.  Will hold off for today and put him on schedule to see tomorrow.   Procedure:  1. Cystoscopy 2. Bilateral ureteral stent placement (6 x 26 - no string) 3. Bilateral retrograde pyelography with interpretation  Rica Koyanagi  PTA Acute  Rehabilitation Services Pager      [phone] Office      [phone]

## 2020-07-06 NOTE — Anesthesia Postprocedure Evaluation (Signed)
Anesthesia Post Note  Patient: Chase [name]  Procedure(s) Performed: CYSTOSCOPY WITH BILATERAL STENT EXCHANGE; BILATERAL PYELOGRAM RETROGRADE (Bilateral )     Patient location during evaluation: PACU Anesthesia Type: General Level of consciousness: awake and alert Pain management: pain level controlled Vital Signs Assessment: post-procedure vital signs reviewed and stable Respiratory status: spontaneous breathing, nonlabored ventilation, respiratory function stable and patient connected to nasal cannula oxygen Cardiovascular status: blood pressure returned to baseline and stable Postop Assessment: no apparent nausea or vomiting Anesthetic complications: no   No complications documented.  Last Vitals:  Vitals:   07/06/20 1245 07/06/20 1401  BP: 116/68 115/70  Pulse: 62 (!) 59  Resp: 12 16  Temp:  (!) 36.3 C  SpO2: 100% 97%    Last Pain:  Vitals:   07/06/20 1241  TempSrc:   PainSc: 5                  Ashwini Jago S

## 2020-07-06 NOTE — Progress Notes (Signed)
Patient ID: HAL NORRINGTON, male   DOB: 1965/12/01, 55 y.o.   MRN: [id number]  Day of Surgery Subjective: No new complaints.  Objective: Vital signs in last 24 hours: Temp:  [97.6 F (36.4 C)-99.9 F (37.7 C)] 98.5 F (36.9 C) (04/07 1027) Pulse Rate:  [53-68] 53 (04/07 1027) Resp:  [14-16] 16 (04/07 1027) BP: (114-122)/(68-97) 114/71 (04/07 1027) SpO2:  [97 %-99 %] 97 % (04/07 1027) Weight:  [61.7 kg] 61.7 kg (04/07 0521)  Intake/Output from previous day: 04/06 0701 - 04/07 0700 In: 1575 [P.O.:1260; Blood:315] Out: 0263 [Urine:3400; Stool:675] Intake/Output this shift: Total I/O In: -  Out: 500 [Urine:400; Stool:100]  Physical Exam:  General: Alert and oriented Abdomen: Soft, ND   Lab Results: Recent Labs    07/05/20 0501 07/05/20 1554 07/06/20 0456  HGB 6.7* 9.4* 8.9*  HCT 22.2* 29.6* 28.0*   BMET Recent Labs    07/05/20 0501 07/06/20 0456  NA 139 138  138  K 3.6 3.9  3.8  CL 112* 109  109  CO2 21* 21*  21*  GLUCOSE 86 78  79  BUN 22* 25*  25*  CREATININE 3.85* 3.98*  4.01*  CALCIUM 6.9* 7.0*  7.0*     Studies/Results: No results found.  Assessment/Plan: 1) Bilateral hydronephrosis: He has chronic indwelling stents last changed 2.4months ago. Cystoscopy and bilateral ureteral stent change today.  2) Urinary retention: He does have evidence of detrusor function on recent urodynamics and appears to have bladder outlet obstruction most likely from BPH.Would be reasonable to restart tamsulosin (he was on this previously) and consider voiding trial once renal function has stabilized.    LOS: 9 days   [name] 07/06/2020, 10:59 AM

## 2020-07-06 NOTE — Progress Notes (Signed)
[facility] ROUNDING NOTE   Subjective:   Brief history.  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  He also has a remote history of Hodgkin's lymphoma. He had acute on chronic renal dysfunction with severe metabolic acidosis and altered mental status.  He was started on CRRT therapy 06/27/2020-06/30/2020.  Have discontinued CRRT therapy and monitoring renal function   Pt seen in room, just back from procedure. No new c/o's. 3.8 L uOP yest, net neg 2.8 L for the day.   Wt's - on admit 3/29 at 52- 58kg , peak 63kg on 4/03 , today 61.7kg     Objective:  Vital signs in last 24 hours:  Temp:  [97.4 F (36.3 C)-99.9 F (37.7 C)] 97.4 F (36.3 C) (04/07 1401) Pulse Rate:  [53-68] 59 (04/07 1401) Resp:  [12-16] 16 (04/07 1401) BP: (113-119)/(63-97) 115/70 (04/07 1401) SpO2:  [97 %-100 %] 97 % (04/07 1401) Weight:  [61.7 kg] 61.7 kg (04/07 0521)  Weight change: -1.6 kg Filed Weights   07/03/20 0400 07/04/20 0819 07/06/20 0521  Weight: 62.3 kg 63.3 kg 61.7 kg    Intake/Output: I/O last 3 completed shifts: In: 1575 [P.O.:1260; Blood:315] Out: 3382 [Urine:5100; Stool:1075]   Intake/Output this shift:  Total I/O In: 100 [I.V.:100] Out: 550 [Urine:450; Stool:100]  Exam: Constitutional: sitting up in chair eating breakfast, no distress ENMT: ears and nose without scars or lesions, MMM CV: normal rate, no edema Respiratory: clear to auscultation, normal work of breathing Gastrointestinal: soft, non-tender, no palpable masses or hernias, +colostomy Skin: no visible lesions or rashes Neuro: nonfocal, Ox 3, conversant   Assessment/ Plan:  1. AKI on CKD 5 - b/l creat around 5.  Multiple factors leading to acute injury.  Admitted with altered  mental status severe metabolic acidosis.  SP CRRT 3/29- 06/30/20. Creat up slightly to 3.8 today, no signs of uremia. Good UOP. Pt is significantly less deconditioned here now then on prior admits last year. Pt would like to try HD if needed. He knows that he may or may not tolerate it well in the long-term setting. No indication at this time. Have dc'd IVF"s, he is eating and drinking well.  Creat appears to be leveling off. Will follow.  2. Hypokalemia- replete prn  3. Metabolic acidosis- appears to have resolved. 4. Chronic kidney disease-  with obstructive uropathy, urology following 5. Sepsis - sp 7d course of IV abx.  6. Anemia ckd + other -  transfuse as needed if Hb < 7.0. 7. Urinary retention - urology following, may be due to BPH.  Has indwelling foley.  8. Bilat hydronephrosis- w/ bilat ureteral stents.  Per urology did bilat ureteral stent exchanges today.   Kelly Splinter, MD 07/06/2020, 3:13 PM   Basic Metabolic Panel: Recent Labs  Lab 07/01/20 2034 07/02/20 0500 07/03/20 0500 07/04/20 0627 07/04/20 1142 07/05/20 0501 07/06/20 0456  NA 138 138 135  --  139 139 138  138  K 4.0 4.1 3.9  --  3.6 3.6 3.9  3.8  CL 107 107 106  --  111 112* 109  109  CO2 25 25 24   --  23 21* 21*  21*  GLUCOSE 110* 111* 99  --  97 86 78  79  BUN 17 18 20   --  22* 22* 25*  25*  CREATININE 2.53* 2.89* 3.20*  --  3.69* 3.85* 3.98*  4.01*  CALCIUM 7.1* 7.4* 7.1*  --  7.0* 6.9* 7.0*  7.0*  MG  --  2.2 1.9 1.8  --  1.7 1.7  PHOS 2.7 2.7  --   --  3.5 3.7 4.2  4.1   CBC: Recent Labs  Lab 07/01/20 0430 07/03/20 0500 07/04/20 1142 07/05/20 0501 07/05/20 1554 07/06/20 0456  WBC 7.9 4.0 3.7* 3.1*  --  3.8*  NEUTROABS  --   --  1.8  --   --  1.7  HGB 7.5* 7.4* 7.3* 6.7* 9.4* 8.9*  HCT 23.1* 23.4* 23.5* 22.2* 29.6* 28.0*  MCV 93.9 97.5 99.2 102.3*  --  95.9  PLT 80* 99* 130* 130*  --  154      Medications:   . cefTRIAXone (ROCEPHIN) IVPB 2 gram/100 mL NS (Mini-Bag Plus)     .  (feeding supplement) PROSource Plus  30 mL Oral BID BM  . chlorhexidine  15 mL Mouth Rinse BID  . Chlorhexidine Gluconate Cloth  6 each Topical Daily  . DULoxetine  30 mg Oral BID  . feeding supplement  237 mL Oral BID BM  . fentaNYL  1 patch Transdermal Q72H  . folic acid  1 mg Oral Daily  . gabapentin  300 mg Oral TID  . heparin  5,000 Units Subcutaneous Q8H  . mouth rinse  15 mL Mouth Rinse q12n4p  . pantoprazole  40 mg Oral Daily  . sodium chloride flush  10-40 mL Intracatheter Q12H   acetaminophen, HYDROmorphone (DILAUDID) injection, oxyCODONE, polyethylene glycol, sodium chloride flush    Kelly Splinter, MD 07/06/2020, 3:13 PM

## 2020-07-07 ENCOUNTER — Encounter (HOSPITAL_COMMUNITY): Payer: Self-pay | Admitting: Urology

## 2020-07-07 DIAGNOSIS — G9341 Metabolic encephalopathy: Secondary | ICD-10-CM | POA: Diagnosis not present

## 2020-07-07 DIAGNOSIS — D638 Anemia in other chronic diseases classified elsewhere: Secondary | ICD-10-CM | POA: Diagnosis not present

## 2020-07-07 DIAGNOSIS — N179 Acute kidney failure, unspecified: Secondary | ICD-10-CM | POA: Diagnosis not present

## 2020-07-07 DIAGNOSIS — R338 Other retention of urine: Secondary | ICD-10-CM | POA: Diagnosis not present

## 2020-07-07 LAB — RENAL FUNCTION PANEL
Albumin: 1.7 g/dL — ABNORMAL LOW (ref 3.5–5.0)
Anion gap: 6 (ref 5–15)
BUN: 27 mg/dL — ABNORMAL HIGH (ref 6–20)
CO2: 22 mmol/L (ref 22–32)
Calcium: 7 mg/dL — ABNORMAL LOW (ref 8.9–10.3)
Chloride: 109 mmol/L (ref 98–111)
Creatinine, Ser: 4.07 mg/dL — ABNORMAL HIGH (ref 0.61–1.24)
GFR, Estimated: 16 mL/min — ABNORMAL LOW (ref >60)
Glucose, Bld: 81 mg/dL (ref 70–99)
Phosphorus: 4.8 mg/dL — ABNORMAL HIGH (ref 2.5–4.6)
Potassium: 4 mmol/L (ref 3.5–5.1)
Sodium: 137 mmol/L (ref 135–145)

## 2020-07-07 LAB — COMPREHENSIVE METABOLIC PANEL
ALT: 12 U/L (ref 0–44)
AST: 17 U/L (ref 15–41)
Albumin: 1.6 g/dL — ABNORMAL LOW (ref 3.5–5.0)
Alkaline Phosphatase: 77 U/L (ref 38–126)
Anion gap: 8 (ref 5–15)
BUN: 26 mg/dL — ABNORMAL HIGH (ref 6–20)
CO2: 22 mmol/L (ref 22–32)
Calcium: 7 mg/dL — ABNORMAL LOW (ref 8.9–10.3)
Chloride: 109 mmol/L (ref 98–111)
Creatinine, Ser: 3.96 mg/dL — ABNORMAL HIGH (ref 0.61–1.24)
GFR, Estimated: 17 mL/min — ABNORMAL LOW (ref >60)
Glucose, Bld: 83 mg/dL (ref 70–99)
Potassium: 4 mmol/L (ref 3.5–5.1)
Sodium: 139 mmol/L (ref 135–145)
Total Bilirubin: 0.6 mg/dL (ref 0.3–1.2)
Total Protein: 4.3 g/dL — ABNORMAL LOW (ref 6.5–8.1)

## 2020-07-07 LAB — GLUCOSE, CAPILLARY
Glucose-Capillary: 77 mg/dL (ref 70–99)
Glucose-Capillary: 85 mg/dL (ref 70–99)
Glucose-Capillary: 89 mg/dL (ref 70–99)
Glucose-Capillary: 75 mg/dL (ref 70–99)

## 2020-07-07 LAB — CBC WITH DIFFERENTIAL/PLATELET
Abs Immature Granulocytes: 0.04 10*3/uL (ref 0.00–0.07)
Basophils Absolute: 0 10*3/uL (ref 0.0–0.1)
Basophils Relative: 1 %
Eosinophils Absolute: 0.2 10*3/uL (ref 0.0–0.5)
Eosinophils Relative: 5 %
HCT: 28 % — ABNORMAL LOW (ref 39.0–52.0)
Hemoglobin: 8.5 g/dL — ABNORMAL LOW (ref 13.0–17.0)
Immature Granulocytes: 1 %
Lymphocytes Relative: 35 %
Lymphs Abs: 1.2 10*3/uL (ref 0.7–4.0)
MCH: 30.6 pg (ref 26.0–34.0)
MCHC: 30.4 g/dL (ref 30.0–36.0)
MCV: 100.7 fL — ABNORMAL HIGH (ref 80.0–100.0)
Monocytes Absolute: 0.4 10*3/uL (ref 0.1–1.0)
Monocytes Relative: 10 %
Neutro Abs: 1.6 10*3/uL — ABNORMAL LOW (ref 1.7–7.7)
Neutrophils Relative %: 48 %
Platelets: 162 10*3/uL (ref 150–400)
RBC: 2.78 MIL/uL — ABNORMAL LOW (ref 4.22–5.81)
RDW: 16.5 % — ABNORMAL HIGH (ref 11.5–15.5)
WBC: 3.4 10*3/uL — ABNORMAL LOW (ref 4.0–10.5)
nRBC: 0 % (ref 0.0–0.2)

## 2020-07-07 LAB — PHOSPHORUS: Phosphorus: 5 mg/dL — ABNORMAL HIGH (ref 2.5–4.6)

## 2020-07-07 LAB — MAGNESIUM: Magnesium: 1.7 mg/dL (ref 1.7–2.4)

## 2020-07-07 MED ORDER — PROSOURCE PLUS PO LIQD: 30.0000 mL | Freq: Two times a day (BID) | ORAL | 0 refills | Status: DC

## 2020-07-07 MED ORDER — SILODOSIN 8 MG PO CAPS: 8.0000 mg | ORAL_CAPSULE | Freq: Every day | ORAL | 0 refills | Status: DC

## 2020-07-07 MED ORDER — HYDROXYZINE HCL 10 MG PO TABS: 10.0000 mg | ORAL_TABLET | Freq: Three times a day (TID) | ORAL | 0 refills | Status: AC | PRN

## 2020-07-07 MED ORDER — FOLIC ACID 1 MG PO TABS: 1.0000 mg | ORAL_TABLET | Freq: Every day | ORAL | 0 refills | Status: DC

## 2020-07-07 MED ORDER — DULOXETINE HCL 30 MG PO CPEP: 30.0000 mg | ORAL_CAPSULE | Freq: Two times a day (BID) | ORAL | 0 refills | Status: DC

## 2020-07-07 MED ORDER — ENSURE ENLIVE PO LIQD: 237.0000 mL | Freq: Two times a day (BID) | ORAL | 12 refills | Status: DC

## 2020-07-07 MED ORDER — FENTANYL 25 MCG/HR TD PT72: 1.0000 | MEDICATED_PATCH | TRANSDERMAL | 0 refills | Status: DC

## 2020-07-07 MED ORDER — POLYETHYLENE GLYCOL 3350 17 G PO PACK: 17.0000 g | PACK | Freq: Every day | ORAL | 0 refills | Status: DC | PRN

## 2020-07-07 NOTE — Progress Notes (Signed)
Physical Therapy Treatment Patient Details Name: Chase [name] MRN: [id number] DOB: 1966/02/18 Today's Date: 07/07/2020    History of Present Illness Pt is 55 year old man who was found 3/29 on the floor by his son, unclear downtime. At  [facility] ED on 3/29 found to have a profound metabolic acidosis with acute on chronic renal failure, elevated CK, relative hypotension, mild transaminitis. CVVHD started 3/29 with significant improvement in severe metabolic acidosis.   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    PT Comments    Pt making excellent progress since PT evaluation.  He was able to transfer and ambulate in room with supervision and RW.  Requiring min cues for safety.  Pt's orthostatic BP were negative (dropped with standing but asymptomatic and improved at 3 mins).  Pt no longer appropriate for SNF (and has declined SNF) but could benefit from increased support at home.   Orthostatic BPs  Supine 114/70 HR 67  Sitting 101/76 HR 68     Standing 93/69 and HR 73  Standing after 3 min 104/79 and HR 76  Pt asymptomatic     Follow Up Recommendations  Home health PT;Supervision - Intermittent (max HH services)     Equipment Recommendations  None recommended by PT (has DME)    Recommendations for Other Services       Precautions / Restrictions Precautions Precautions: Fall Precaution Comments: colostomy    Mobility  Bed Mobility Overal bed mobility: Independent Bed Mobility: Supine to Sit;Sit to Supine     Supine to sit: Independent Sit to supine: Independent   General bed mobility comments: bed flat, no rails    Transfers Overall transfer level: Needs assistance Equipment used: Rolling walker (2 wheeled) Transfers: Sit to/from Stand Sit to  Stand: Supervision         General transfer comment: increased time but did not need assist  Ambulation/Gait Ambulation/Gait assistance: Supervision Gait Distance (Feet): 40 Feet Assistive device: Rolling walker (2 wheeled) Gait Pattern/deviations: Step-to pattern;Trunk flexed Gait velocity: decreased   General Gait Details: slow but steady gait; min cues for posture   Stairs             Wheelchair Mobility    Modified Rankin (Stroke Patients Only)       Balance Overall balance assessment: Needs assistance Sitting-balance support: Feet supported Sitting balance-Leahy Scale: Normal     Standing balance support: No upper extremity supported;Bilateral upper extremity supported Standing balance-Leahy Scale: Fair Standing balance comment: RW for ambulation but could static stand without AD                            Cognition Arousal/Alertness: Awake/alert Behavior During Therapy: WFL for tasks assessed/performed Overall Cognitive Status: Within Functional Limits for tasks assessed                                        Exercises      General Comments        Pertinent Vitals/Pain Pain Assessment: No/denies pain    Home Living                      Prior Function  PT Goals (current goals can now be found in the care plan section) Acute Rehab PT Goals Patient Stated Goal: To go home Progress towards PT goals: Progressing toward goals    Frequency    Min 3X/week      PT Plan Current plan remains appropriate    Co-evaluation              AM-PAC PT "6 Clicks" Mobility   Outcome Measure  Help needed turning from your back to your side while in a flat bed without using bedrails?: None Help needed moving from lying on your back to sitting on the side of a flat bed without using bedrails?: None Help needed moving to and from a bed to a chair (including a wheelchair)?: A Little Help needed  standing up from a chair using your arms (e.g., wheelchair or bedside chair)?: A Little Help needed to walk in hospital room?: A Little Help needed climbing 3-5 steps with a railing? : A Little 6 Click Score: 20    End of Session   Activity Tolerance: Patient tolerated treatment well Patient left: in bed;with call bell/phone within reach;with bed alarm set Nurse Communication: Mobility status (orthostatic pressures clear) PT Visit Diagnosis: Muscle weakness (generalized) (M62.81)     Time: 3151-7616 PT Time Calculation (min) (ACUTE ONLY): 20 min  Charges:  $Gait Training: 8-22 mins                     Abran Richard, PT Acute Rehab Services Pager [phone] [facility] 07/07/2020, 3:24 PM

## 2020-07-07 NOTE — Progress Notes (Signed)
Returned phone call from Jaclynn Major, patient's son. Advised him that patient seems to be clear to discharge. Physician is checking on sign off from nephrology. Patient is aware that SNF is recommended and has declined. Advised son that he will discharge home once all following physicians sign off.

## 2020-07-07 NOTE — Plan of Care (Signed)
  Problem: Education: Goal: Knowledge of disease and its progression will improve Outcome: Progressing   Problem: Pain Managment: Goal: General experience of comfort will improve Outcome: Progressing   

## 2020-07-07 NOTE — Discharge Summary (Signed)
Physician Discharge Summary  Chase [name] NLG:[id number] DOB: February 03, 1966 DOA: 06/27/2020  PCP: [name]  Admit date: 06/27/2020 Discharge date: 07/07/2020  Admitted From: Home Disposition: Home with Home Health PT/OT  Recommendations for Outpatient Follow-up:  1. Follow up with PCP in 1-2 weeks 2. Follow up with Urology within 1-2 weeks 3. Follow up with Nephrology within 1-2 weeks 4. Please obtain CMP/CBC, Mag, Phos in one week 5. 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  Patient had been admitted in April 05, 2020 with acute on chronic renal failure related to obstructive nephropathy after his nephrostomy tubes were removed.  He had internal ureteral stents replaced at that time and a diverting loop colostomy was also performed for the fistula.  He was placed on high-dose Neurontin and narcotics for chronic pain and history of PTSD.  Subsequently he was discharged to the hospital and on 06/27/2020 he was found on the floor by his son of unclear downtime.  His dressing will admit date 06/27/2018 when he was evaluated by EMS after a fall with question of narcotic overdose.  He is not brought to the ED at that time but then when his son found  him on the floor his [facility] and found to have a profound metabolic acidosis with acute on chronic renal failure, elevated CK, relative hypotension, mild transaminitis.  He is started on IV bicarbonate, IV fluid resuscitation and received IV ceftriaxone and was escalated to IV vancomycin and meropenem and completed 7 days of IV meropenem.  Subsequently he ended up requiring CRRT while in the ICU and then transferred out of the ICU on 07/04/2020 to [facility] service.  Currently the patient is improved from a mentation standpoint and underwent bilateral stent exchange today by urology.  His renal function continues to climb after his CRRT but his baseline creatinine is between 4-4.5.  PT and OT still recommending home health   Discharge Diagnoses:  Active Problems:   Shock ([place])   Kidney failure   Pressure injury of skin   Delirium   Sepsis with acute renal failure (HCC)   Anemia of chronic disease   Acute urinary retention   Bilateral hydronephrosis   Acute metabolic encephalopathy   Acute kidney injury superimposed on chronic kidney disease (HCC)  Severe anion gap metabolic acidosis  -Likely secondary to acute kidney injury on chronic kidney disease stage V. -Status post CRRT therapy(3/29-4/1). -Had a mild acidosis today with a CO2 of 21, anion gap of 8, chloride level 109 -Continue monitor and trend and repeat CMP in a.m.  Acute on chronic kidney disease stage V with history of urinary obstruction post bilateral nephrostomy (04/2020) Probable Rhabdomyolyiss  -Likely multifactorial leading to acute kidney injury/ATN from sepsis and possible rhabdomyolysis superimposed. -Status post CRRT therapy(3/29-4/1). -Patient is BUN/creatinine improved although it is 16/1.42 has steadily trended  up to stabilized and is now 27/4.07 seline Cr was 4-4.5 as an outpatient and he is at baseline now -It is noted that patient with prior history of acute kidney injury with temporary  HD. -Nephrology following and feel at this time patient may be considered as a long-term HD candidate if needed. -Nephrology recommended transfusing 2 units of PRBCs his hemoglobin had dropped and feel that he has no signs of uremia and he continues a good urine output. -CK was elevated and trended down to 1233 on 06/28/20 -Per nephrology the patient would like to try hemodialysis if needed and that after discussion with the patient that the patient may or may not tolerate it well in the long-term setting -Nephrology has discontinued his IV fluids as the patient is eating and drinking well and they feel that his creatinine leveling off and we will continue to follow conservatively -Renal Fxn has stablized will need outpatient follow up with Nephrology  Acute Metabolic Encephalopathy, improved -Likely secondary to above in the setting of uremia -Currently improved. -Patient currently at baseline. Resolved. -Continue monitor and trend carefully  Acute Respiratory Failure with Hypoxia -Improved and Resolved -SpO2: 97 % O2 Flow Rate (L/min): 8 L/min; No Longer on Supplemental O2 via [place] and saturating well -Continue to Monitor and Trend Respiratory Status  -He is stable to be discharged and did not require oxygen on ambulation  Septic shock, improved significantly -??Etiology but possibly the Urine. Patient with component of hypovolemia likely also leading to shock. -2D echo obtained with normal EF,NWMA. -Patient was on stress dose steroids which have subsequently been discontinued. -Blood cultures with no growth to date. -Alysis showed a turbid appearance with moderate hemoglobin, 5 ketones, moderate leukocytes, negative nitrites, many bacteria, 11-29 squamous epithelial cells, 6-10 RBCs per high-power field, greater than 50 WBCs; unfortunately urine culture was never obtained on admission -Was empirically on IV vancomycin and IV Meropenem. -IV vancomycin discontinued. -Had a  Colovesical Fistula that spontaneously closed up.  -Completed IV meropenem -Continue to follow WBC and temperature curve -Patient has 650 mg acetaminophen every 6 hours as needed for Fever  Chronic Pain Syndrome  -On Opiate/Fentanyl patch 25 mcg/h 1 patch transdermal every 72 hours and will continue at discharge -Continue duloxetine 30 mg p.o. twice daily and gabapentin 300 mg p.o. 3 times daily. -Patient also has Oxycodone 5 to 10 mg p.o. every 4 hours as needed for moderate and severe pain for breakthrough pain and will not write for prescription given that he had a 30-day supply  Bilateral Hydronephrosis  -Patient with chronic indwelling stents last changed about 2-1/2 months ago. Patient being followed by urology. -Bilateral Stent exchange by Dr. Raynelle Bring on 07/06/20 -Further care per urology and he will have outpatient management for trial of void  Urinary Retention -Continue urethral catheter for now. -Urology feels that he does have evidence of detrusor function on recent urodynamics and appears to have bladder outlet obstruction most likely from BPH -Per Urology they are going to assess the patient for restarting alpha-blocker therapy and voiding trial once stabilization of his renal function occurs -I discussed the case with urology Dr. Alinda Money who recommends starting the patient on Rapaflo 8 mg daily I written this for discharge and he will have an outpatient trial of void  Abnormal LFTs/Mild Transaminitis -Likely secondary to septic shock. -Improved and on last check normalized with an AST of 17 and ALT of 12 -Continue to Monitor and Trend -Repeat CMP in the AM   Anxiety  and Depression/PTSD  -Resume prazosin and hydroxyzine  GERD -Continue with Pantoprazole 40 mg p.o. daily  History of diverting colostomy/colovesicular fistula -Continue colostomy care with [place] nurse   Severe Protein Calorie Malnutrition in the context of Chronic illness -Nutritionist  consulted for further evaluation and recommendations -Continue Nutritional Supplementation with Ensure Enlive BID, 30 mL Prosource Plus BID and 1 Tab MVI with Minerals/Day   Coronary Artery Disease -Currently Aspirin, Plavix, statin on hold. -Resume in the outpatient setting at D/C   Anemia of Chronic Disease/Folate Deficiency -Hemoglobin currently stable at 7.3. -Anemia panel was done and showed an Iron level of 83, UIBC of 42, TIBC of 125, saturation ratios of 66%, ferritin level of 284, folate level 4.0, and vitamin B09 628 -Start folic acid 1 mg daily. -Transfusion threshold hemoglobin< 7. -Patient's hemoglobin/hematocrit fell from 7.3/23.5 and was 6.7/22.2 yesterday morning and after 2 units improved to 9.4/29.6 and yesterday was 8.9/28.0 and today is 8.5/28.0 -We will type and screen and transfuse 2 units of PRBCs -Continue to monitor for signs and symptoms of bleeding; currently no overt bleeding noted  -Repeat CBC in the morning  Pancytopenia -Patient's WBC is now gone from 3.1 and is 3.8 -Patient's Hgb/Hct is now gone from 6.7/22.2 and is 8.9/20.0 after 2 units and today it is stable at 8.5/28.0 with an MCV of 100.7 -Platelet Count has improved and gone from 130 and is 154 minus further improved to 162 -Continue to Monitor for S/Sx of Bleeding;  -Repeat CBC in the AM  Hypoalbuminemia -Patient's Albumin is 1.7 -Supplementation as above   Generalized Weakness and Profound Deconditioning/Debility -PT/OT consulted and recommending [place] and he dropped Standing some but was Asymptomatic   Pressure Injury, POA Pressure Injury 11/08/19 Coccyx Upper Stage 2 - Partial thickness loss of dermis presenting as a shallow open injury with a red, pink wound bed without slough. unblanchable with an open slit in the middle non draining (Active)  11/08/19 0300  Location: Coccyx  Location Orientation: Upper  Staging: Stage 2 - Partial thickness loss  of dermis presenting as a shallow open injury with a red, pink wound bed without slough.  Wound Description (Comments): unblanchable with an open slit in the middle non draining   Present on Admission: Yes    Pressure Injury 06/27/20 Coccyx Mid Stage 2 - Partial thickness loss of dermis presenting as a shallow open injury with a red, pink wound bed without slough. pink, red (Active)  06/27/20 1800  Location: Coccyx  Location Orientation: Mid  Staging: Stage 2 - Partial thickness loss of dermis presenting as a shallow open injury with a red, pink wound bed without slough.  Wound Description (Comments): pink, red  Present on Admission: Yes   Discharge Instructions  Allergies as of 07/07/2020      Reactions   Nsaids Other (See Comments)   Bleeding GI Ulceration   Ciprofloxacin Nausea And Vomiting      Medication List    STOP taking these medications   oxyCODONE 5 MG immediate release tablet Commonly known as: Oxy IR/ROXICODONE   tamsulosin 0.4 MG Caps capsule Commonly known as: FLOMAX   tiZANidine 2 MG tablet Commonly known as: ZANAFLEX     TAKE these medications   (feeding supplement) PROSource Plus liquid Take 30 mLs by mouth 2 (two) times daily between meals.   feeding supplement Liqd Take 237 mLs by mouth 2 (two) times daily between meals.   acetaminophen 500 MG tablet Commonly known as: TYLENOL  Take 1 tablet (500 mg total) by mouth every 6 (six) hours as needed for mild pain, fever or headache.   calcium carbonate 500 MG chewable tablet Commonly known as: TUMS - dosed in mg elemental calcium Chew 1 tablet (200 mg of elemental calcium total) by mouth 3 (three) times daily.   cyanocobalamin 1000 MCG/ML injection Commonly known as: (VITAMIN B-12) Inject 1,000 mcg into the muscle every 30 (thirty) days.   docusate sodium 100 MG capsule Commonly known as: COLACE Take 100 mg by mouth 2 (two) times daily as needed for mild constipation.   DULoxetine 30 MG  capsule Commonly known as: CYMBALTA TAKE 1-2 CAPSULES (30-60 MG TOTAL) BY MOUTH SEE ADMIN INSTRUCTIONS. TAKE 60 MG IN THE MORNING AND 30 MG IN THE EVENING   fentaNYL 25 MCG/HR Commonly known as: [place] 1 patch onto the skin every 3 (three) days. Start taking on: July 09, 5275   folic acid 1 MG tablet Commonly known as: FOLVITE Take 1 tablet (1 mg total) by mouth daily. Start taking on: July 08, 2020   gabapentin 300 MG capsule Commonly known as: NEURONTIN TAKE 1 CAPSULE (300 MG TOTAL) BY MOUTH AT BEDTIME.   gabapentin 100 MG capsule Commonly known as: NEURONTIN TAKE 2 CAPSULES (200 MG TOTAL) BY MOUTH DAILY.   hydrOXYzine 10 MG tablet Commonly known as: ATARAX/VISTARIL Take 1 tablet (10 mg total) by mouth 3 (three) times daily as needed for itching or anxiety.   multivitamin with minerals Tabs tablet Take 1 tablet by mouth daily.   oxyCODONE-acetaminophen 5-325 MG tablet Commonly known as: PERCOCET/ROXICET Take 1 tablet by mouth every 4 (four) hours as needed for severe pain.   pantoprazole 20 MG tablet Commonly known as: PROTONIX TAKE 1 TABLET (20 MG TOTAL) BY MOUTH 2 (TWO) TIMES DAILY.   polycarbophil 625 MG tablet Commonly known as: FIBERCON Take 1 tablet (625 mg total) by mouth 2 (two) times daily.   polyethylene glycol 17 g packet Commonly known as: MIRALAX / GLYCOLAX Take 17 g by mouth daily as needed for moderate constipation.   prazosin 1 MG capsule Commonly known as: MINIPRESS Take 1 mg by mouth at bedtime.   silodosin 8 MG Caps capsule Commonly known as: Rapaflo Take 1 capsule (8 mg total) by mouth daily with breakfast.   sodium bicarbonate 650 MG tablet TAKE 1 TABLET (650 MG TOTAL) BY MOUTH 2 (TWO) TIMES DAILY.   Vitamin D3 25 MCG tablet Commonly known as: Vitamin D TAKE 800 UNITS BY MOUTH DAILY.       Follow-up Information    [facility], [facility]. Call.   Why: Follow up within 1-2 weeks with PCP, Urology and Nephrology Contact  information: [place] [address] [phone]              Allergies  Allergen Reactions  . Nsaids Other (See Comments)    Bleeding GI Ulceration  . Ciprofloxacin Nausea And Vomiting   Consultations:  PCCM Transfer  Urology  Nephrology  Procedures/Studies: CT ABDOMEN PELVIS WO CONTRAST  Result Date: 06/27/2020 CLINICAL DATA:  Acute renal failure. EXAM: CT ABDOMEN AND PELVIS WITHOUT CONTRAST TECHNIQUE: Multidetector CT imaging of the abdomen and pelvis was performed following the standard protocol without IV contrast. COMPARISON:  May 01, 2020. FINDINGS: Lower chest: No acute abnormality. Hepatobiliary: No focal liver abnormality is seen. Status post cholecystectomy. No biliary dilatation. Pancreas: Unremarkable. No pancreatic ductal dilatation or surrounding inflammatory changes. Spleen: Normal in size without focal abnormality. Adrenals/Urinary  Tract: Adrenal glands appear normal. Mild bilateral renal atrophy is noted. Bilateral ureteral stents are noted in grossly good position. No hydronephrosis is noted. Foley catheter is noted within the urinary bladder. Stomach/Bowel: Status post gastric surgery. Colostomy is noted in left lower quadrant. The appendix appears normal. There is no evidence of bowel obstruction or inflammation. Vascular/Lymphatic: Aortic atherosclerosis. No enlarged abdominal or pelvic lymph nodes. Reproductive: Stable prostatic enlargement. Other: No abdominal wall hernia or abnormality. No abdominopelvic ascites. Musculoskeletal: No acute or significant osseous findings. IMPRESSION: Bilateral ureteral stents are in grossly good position. No hydronephrosis is noted. Stable prostatic enlargement. Aortic Atherosclerosis (ICD10-I70.0). Electronically Signed   By: Marijo Conception M.D.   On: 06/27/2020 16:16   DG Abd 1 View  Result Date: 07/01/2020 CLINICAL DATA:  NG tube placement EXAM: ABDOMEN - 1 VIEW COMPARISON:  06/30/2020  FINDINGS: Feeding tube tip is in the proximal stomach near the GE junction. Bilateral ureteral stents partially imaged. Prior cholecystectomy. Nonobstructive bowel gas pattern. IMPRESSION: Feeding tube tip in the proximal stomach. Electronically Signed   By: Rolm Baptise M.D.   On: 07/01/2020 01:02   DG Abd 1 View  Result Date: 06/30/2020 CLINICAL DATA:  NG tube placement EXAM: ABDOMEN - 1 VIEW COMPARISON:  06/29/2020 FINDINGS: Scattered large and small bowel gas is noted. Weighted feeding catheter is seen within the stomach. Bilateral ureteral stents are noted. IMPRESSION: Weighted feeding catheter within the stomach. Electronically Signed   By: Inez Catalina M.D.   On: 06/30/2020 01:43   DG Abd 1 View  Result Date: 06/29/2020 CLINICAL DATA:  OG tube placement. EXAM: ABDOMEN - 1 VIEW COMPARISON:  CT 06/27/2020, 2 days ago. FINDINGS: Weighted enteric tube in place, the tube is looped in the stomach, with the tip in the region of the gastric cardia. Bilateral ureteral stents in place. Nonobstructive bowel gas pattern. IMPRESSION: Weighted enteric tube in place, tube looped in the stomach, tip in the region of the gastric cardia. Electronically Signed   By: Keith Rake M.D.   On: 06/29/2020 17:02   CT Head Wo Contrast  Result Date: 06/27/2020 CLINICAL DATA:  Fall.  Altered mental status. EXAM: CT HEAD WITHOUT CONTRAST TECHNIQUE: Contiguous axial images were obtained from the base of the skull through the vertex without intravenous contrast. COMPARISON:  CT head dated October 18, 2019. FINDINGS: Brain: No evidence of acute infarction, hemorrhage, hydrocephalus, extra-axial collection or mass lesion/mass effect. Small area of encephalomalacia in the right occipital lobe from prior infarct. Vascular: Atherosclerotic vascular calcification of the carotid siphons. No hyperdense vessel. Skull: Normal. Negative for fracture or focal lesion. Sinuses/Orbits: No acute finding. Other: None. IMPRESSION: 1. No acute  intracranial abnormality. 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 Electronically Signed   By: Titus Dubin M.D.   On: 06/27/2020 13:44   US Renal  Result Date: 06/27/2020 CLINICAL DATA:  Acute kidney failure. History of nephrostomy tubes. Unresponsive. EXAM: RENAL / URINARY TRACT ULTRASOUND COMPLETE COMPARISON:  Most recent abdominal imaging 03/31/2020 FINDINGS: Right Kidney: Renal measurements: 7.4 x 4.1 x 4.9 cm = volume: 77 mL. Moderate hydronephrosis. Stent is present in the right renal pelvis, presumed ureteral stent. There is thinning of the renal parenchyma with increased echogenicity. No focal lesion or evidence of stone. Left Kidney: Renal measurements: 8.8 x 5.2 x 5.2 cm = volume: 123 mL. Moderate hydronephrosis. The ureteral stent is faintly visualized, with tip in the lower renal pelvis. Mild thinning of the renal parenchyma and increased echogenicity. An 8 mm  echogenic focus in the lateral kidney may represent a stone or air bubble when no evidence of focal mass. Compared with prior exam. Bladder: Foley catheter decompresses the bladder. Bladder appears thick walled, but is not well assessed. Other: None. IMPRESSION: 1. Moderate bilateral hydroureteronephrosis, similar in degree to prior imaging. 2. Right ureteral stent with proximal pigtail in the renal pelvis. The left ureteral stent is faintly visualized in the low pelvis. 3. Increased renal echogenicity consistent with chronic medical renal disease. 4. Echogenic focus in the left kidney may represent a nonobstructing stone or air bubble when compared with prior CT. Electronically Signed   By: Keith Rake M.D.   On: 06/27/2020 15:21   DG CHEST PORT 1 VIEW  Result Date: 06/29/2020 CLINICAL DATA:  Respiratory crackles EXAM: PORTABLE CHEST 1 VIEW COMPARISON:  06/27/2020 FINDINGS: Unchanged right upper lobe opacities. Position of the right IJ central venous catheter tip remains in the mid SVC. Cardiomediastinal contours are normal. No  pleural effusion or pneumothorax. IMPRESSION: Unchanged right upper lobe opacities. Electronically Signed   By: Ulyses Jarred M.D.   On: 06/29/2020 03:34   DG CHEST PORT 1 VIEW  Result Date: 06/27/2020 CLINICAL DATA:  Central line placement EXAM: PORTABLE CHEST 1 VIEW COMPARISON:  None. FINDINGS: Central venous catheter tip is in the lower SVC. No pneumothorax. Hazy opacities noted in the right upper lobe. No pleural effusion. IMPRESSION: Central venous catheter tip in the lower SVC. New hazy opacity in the right upper lobe may be a composite shadow artifact. Consider upright PA and lateral radiographs when possible. Electronically Signed   By: Ulyses Jarred M.D.   On: 06/27/2020 19:20   DG Chest Portable 1 View  Result Date: 06/27/2020 CLINICAL DATA:  Agitated, found unresponsive earlier in the day. EXAM: PORTABLE CHEST 1 VIEW COMPARISON:  Chest x-ray from April 02, 2020 FINDINGS: Trachea midline. Cardiomediastinal contours and hilar structures are normal. Lungs are clear.  No sign of effusion on frontal view. EKG leads project over the chest. On limited assessment no acute skeletal process IMPRESSION: No acute cardiopulmonary disease. Electronically Signed   By: Zetta Bills M.D.   On: 06/27/2020 13:55   DG Abd Portable 1V  Result Date: 06/29/2020 CLINICAL DATA:  Feeding tube placement EXAM: PORTABLE ABDOMEN - 1 VIEW COMPARISON:  06/29/2020 at 4:01 p.m. FINDINGS: Tip of weighted enteric tube projects over the lower medial abdomen, likely within the distal aspect of a J-shaped stomach. There are bilateral nephroureteral stents. IMPRESSION: Tip of weighted enteric tube projects over the lower medial abdomen, likely within a J-shaped stomach. Electronically Signed   By: Ulyses Jarred M.D.   On: 06/29/2020 23:11   DG C-Arm 1-60 Min-No Report  Result Date: 07/06/2020 Fluoroscopy was utilized by the requesting physician.  No radiographic interpretation.   ECHOCARDIOGRAM COMPLETE  Result Date:  06/28/2020    ECHOCARDIOGRAM REPORT   Patient Name:   JOVIAN LEMBCKE Date of Exam: 06/28/2020 Medical Rec #:  [id number]       Height:       66.0 in Accession #:    [id number]      Weight:       116.4 lb Date of Birth:  1965-11-22       BSA:          1.589 m Patient Age:    67 years        BP:           144/71 mmHg Patient Gender: M  HR:           63 bpm. Exam Location:  Inpatient Procedure: 2D Echo, Cardiac Doppler and Color Doppler Indications:    Dyspnea R06.00  History:        Patient has prior history of Echocardiogram examinations, most                 recent 10/19/2019. Previous Myocardial Infarction; Stroke.                 COVID-19. CKD. GERD. Cancer.  Sonographer:    Jonelle Sidle Dance Referring Phys: [place]  1. Left ventricular ejection fraction, by estimation, is 60 to 65%. The left ventricle has normal function. The left ventricle has no regional wall motion abnormalities. Left ventricular diastolic parameters were normal.  2. Right ventricular systolic function is normal. The right ventricular size is normal. There is normal pulmonary artery systolic pressure.  3. Left atrial size was mildly dilated.  4. The mitral valve is normal in structure. No evidence of mitral valve regurgitation. No evidence of mitral stenosis.  5. The aortic valve is normal in structure. Aortic valve regurgitation is not visualized. No aortic stenosis is present.  6. The inferior vena cava is normal in size with greater than 50% respiratory variability, suggesting right atrial pressure of 3 mmHg. FINDINGS  Left Ventricle: Left ventricular ejection fraction, by estimation, is 60 to 65%. The left ventricle has normal function. The left ventricle has no regional wall motion abnormalities. Global longitudinal strain performed but not reported based on interpreter judgement due to suboptimal tracking. 3D left ventricular ejection fraction analysis performed but not reported based on interpreter  judgement due to suboptimal quality. The left ventricular internal cavity size was normal in size. There is no left ventricular hypertrophy. Left ventricular diastolic parameters were normal. Right Ventricle: The right ventricular size is normal. No increase in right ventricular wall thickness. Right ventricular systolic function is normal. There is normal pulmonary artery systolic pressure. The tricuspid regurgitant velocity is 2.64 m/s, and  with an assumed right atrial pressure of 8 mmHg, the estimated right ventricular systolic pressure is 73.2 mmHg. Left Atrium: Left atrial size was mildly dilated. Right Atrium: Right atrial size was normal in size. Pericardium: There is no evidence of pericardial effusion. Mitral Valve: The mitral valve is normal in structure. No evidence of mitral valve regurgitation. No evidence of mitral valve stenosis. Tricuspid Valve: The tricuspid valve is normal in structure. Tricuspid valve regurgitation is trivial. No evidence of tricuspid stenosis. Aortic Valve: The aortic valve is normal in structure. Aortic valve regurgitation is not visualized. No aortic stenosis is present. Pulmonic Valve: The pulmonic valve was normal in structure. Pulmonic valve regurgitation is not visualized. No evidence of pulmonic stenosis. Aorta: The aortic root is normal in size and structure. Venous: The inferior vena cava is normal in size with greater than 50% respiratory variability, suggesting right atrial pressure of 3 mmHg. IAS/Shunts: No atrial level shunt detected by color flow Doppler.  LEFT VENTRICLE PLAX 2D LVIDd:         4.81 cm  Diastology LVIDs:         3.31 cm  LV e' medial:    9.57 cm/s LV PW:         0.99 cm  LV E/e' medial:  8.2 LV IVS:        0.88 cm  LV e' lateral:   18.30 cm/s LVOT diam:     2.10 cm  LV E/e' lateral: 4.3 LV SV:         74 LV SV Index:   47 LVOT Area:     3.46 cm  RIGHT VENTRICLE             IVC RV Basal diam:  2.71 cm     IVC diam: 2.15 cm RV S prime:     16.60 cm/s  TAPSE (M-mode): 2.3 cm LEFT ATRIUM         Index LA diam:    3.90 cm 2.45 cm/m  AORTIC VALVE LVOT Vmax:   122.00 cm/s LVOT Vmean:  78.000 cm/s LVOT VTI:    0.215 m  AORTA Ao Root diam: 3.60 cm MITRAL VALVE               TRICUSPID VALVE MV Area (PHT): 2.39 cm    TR Peak grad:   27.9 mmHg MV Decel Time: 318 msec    TR Vmax:        264.00 cm/s MV E velocity: 78.57 cm/s MV A velocity: 55.10 cm/s  SHUNTS MV E/A ratio:  1.43        Systemic VTI:  0.22 m                            Systemic Diam: 2.10 cm Jenkins Rouge MD Electronically signed by Jenkins Rouge MD Signature Date/Time: 06/28/2020/4:25:48 PM    Final    DG HIP UNILAT WITH PELVIS 2-3 VIEWS LEFT  Result Date: 06/27/2020 CLINICAL DATA:  Found down.  Agitation. EXAM: DG HIP (WITH OR WITHOUT PELVIS) 2-3V LEFT COMPARISON:  05/01/2020 FINDINGS: There is no evidence of hip fracture or dislocation. Hip joint spaces are preserved. Decreased offset of the bilateral femoral head-neck junctions. No suspicious bony lesion. Bilateral nephroureteral stents remain in place. IMPRESSION: Negative. Electronically Signed   By: Davina Poke D.O.   On: 06/27/2020 13:55    Subjective: Seen and examined at bedside was feeling well and states his pain was fairly well controlled.  No nausea or vomiting.  States that he slept last night.  Denies any lightheadedness or dizziness.  No other concerns or complaints at this time and is ready to go home.  Discharge Exam: Vitals:   07/06/20 2003 07/07/20 0325  BP: 110/66 101/62  Pulse: 62 (!) 57  Resp: 15 15  Temp: 98.1 F (36.7 C) 97.9 F (36.6 C)  SpO2: 96% 97%   Vitals:   07/06/20 1401 07/06/20 2003 07/07/20 0325 07/07/20 0500  BP: 115/70 110/66 101/62   Pulse: (!) 59 62 (!) 57   Resp: 16 15 15    Temp: (!) 97.4 F (36.3 C) 98.1 F (36.7 C) 97.9 F (36.6 C)   TempSrc:  Oral Oral   SpO2: 97% 96% 97%   Weight:    65.7 kg  Height:       General: Pt is alert, awake, not in acute distress Cardiovascular: RRR,  S1/S2 +, no rubs, no gallops Respiratory: Mildly diminished bilaterally, no wheezing, no rhonchi; unlabored breathing Abdominal: Soft, NT, slightly distended and 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    Microbiology: Recent Results (from the past 240 hour(s))  Resp Panel by RT-PCR (Flu A&B, Covid) Nasopharyngeal Swab     Status: None   Collection Time: 06/27/20  1:25 PM   Specimen: Nasopharyngeal Swab; Nasopharyngeal(NP) swabs in vial transport medium  Result Value Ref Range Status   SARS Coronavirus 2 by RT PCR NEGATIVE NEGATIVE Final    Comment: (NOTE) SARS-CoV-2 target nucleic acids are NOT DETECTED.  The SARS-CoV-2 RNA is generally detectable in upper respiratory specimens during the acute phase of infection. The lowest concentration of SARS-CoV-2 viral copies this assay can detect is 138 copies/mL. A negative result does not preclude SARS-Cov-2 infection and should not be used as the sole basis for treatment or other patient management decisions. A negative result may occur with  improper specimen collection/handling, submission of specimen other than nasopharyngeal swab, presence of viral mutation(s) within the areas targeted by this assay, and inadequate number of viral copies(<138 copies/mL). A negative result must be combined with clinical observations, patient history, and epidemiological information. The expected result is Negative.  Fact Sheet for Patients:  EntrepreneurPulse.com.au  Fact Sheet for Healthcare Providers:  IncredibleEmployment.be  This test is no t yet approved or cleared by the [place] FDA and  has been authorized for detection and/or diagnosis of SARS-CoV-2 by FDA under an Emergency Use Authorization (EUA).  This EUA will remain  in effect (meaning this test can be used) for the duration of the COVID-19 declaration under Section 564(b)(1) of the Act, 21 U.S.C.section 360bbb-3(b)(1), unless the authorization is terminated  or revoked sooner.       Influenza A by PCR NEGATIVE NEGATIVE Final   Influenza B by PCR NEGATIVE NEGATIVE Final    Comment: (NOTE) The Xpert Xpress SARS-CoV-2/FLU/RSV plus assay is intended as an aid in the diagnosis of influenza from Nasopharyngeal swab specimens and should not be used as a sole basis for treatment. Nasal washings and aspirates are unacceptable for Xpert Xpress SARS-CoV-2/FLU/RSV testing.  Fact Sheet for Patients: EntrepreneurPulse.com.au  Fact Sheet for Healthcare Providers: IncredibleEmployment.be  This test is not yet approved or cleared by the [place] FDA and has been authorized for detection and/or diagnosis of SARS-CoV-2 by FDA under an Emergency Use Authorization (EUA). This EUA will remain in effect (meaning this test can be used) for the duration of the COVID-19 declaration under Section 564(b)(1) of the Act, 21 U.S.C. section 360bbb-3(b)(1), unless the authorization is terminated or revoked.  Performed at [facility], [address] [place], [place] [address]   MRSA PCR Screening     Status: None   Collection Time: 06/27/20  6:00 PM   Specimen: Nasal Mucosa; Nasopharyngeal  Result Value Ref Range Status   MRSA by PCR NEGATIVE NEGATIVE Final    Comment:        The GeneXpert MRSA Assay (FDA approved for NASAL specimens only), is one component of a comprehensive MRSA colonization surveillance program. It is not intended to diagnose MRSA infection nor to guide or monitor treatment for MRSA infections. Performed at [facility], [place] [address], [place], [place] [address]     Labs: BNP (last 3 results) No results for input(s): BNP in the last 8760 hours. Basic  Metabolic Panel: Recent Labs  Lab 07/02/20 0500 07/03/20 0500 07/04/20 0627 07/04/20 1142 07/05/20 0501 07/06/20 0456 07/07/20 0605  NA 138 135  --  139 139 138  138 137  139  K 4.1 3.9  --  3.6 3.6 3.9  3.8 4.0  4.0  CL 107 106  --  111 112* 109  109 109  109  CO2 25 24  --  23 21* 21*  21* 22  22  GLUCOSE 111* 99  --  97 86 78  79 81  83  BUN 18 20  --  22* 22* 25*  25* 27*  26*  CREATININE 2.89* 3.20*  --  3.69* 3.85* 3.98*  4.01* 4.07*  3.96*  CALCIUM 7.4* 7.1*  --  7.0* 6.9* 7.0*  7.0* 7.0*  7.0*  MG 2.2 1.9 1.8  --  1.7 1.7 1.7  PHOS 2.7  --   --  3.5 3.7 4.2  4.1 4.8*  5.0*   Liver Function Tests: Recent Labs  Lab 07/03/20 0500 07/04/20 1142 07/05/20 0501 07/06/20 0456 07/07/20 0605  AST 22  --   --  21 17  ALT 27  --   --  14 12  ALKPHOS 85  --   --  81 77  BILITOT 0.4  --   --  0.7 0.6  PROT 4.3*  --   --  4.4* 4.3*  ALBUMIN 1.7* 1.8* 1.7* 1.7*  1.7* 1.7*  1.6*   No results for input(s): LIPASE, AMYLASE in the last 168 hours. No results for input(s): AMMONIA in the last 168 hours. CBC: Recent Labs  Lab 07/03/20 0500 07/04/20 1142 07/05/20 0501 07/05/20 1554 07/06/20 0456 07/07/20 0605  WBC 4.0 3.7* 3.1*  --  3.8* 3.4*  NEUTROABS  --  1.8  --   --  1.7 1.6*  HGB 7.4* 7.3* 6.7* 9.4* 8.9* 8.5*  HCT 23.4* 23.5* 22.2* 29.6* 28.0* 28.0*  MCV 97.5 99.2 102.3*  --  95.9 100.7*  PLT 99* 130* 130*  --  154 162   Cardiac Enzymes: No results for input(s): CKTOTAL, CKMB, CKMBINDEX, TROPONINI in the last 168 hours. BNP: Invalid input(s): POCBNP CBG: Recent Labs  Lab 07/03/20 1136 07/06/20 2005 07/06/20 2337 07/07/20 0731 07/07/20 1159  GLUCAP 111* 91 118* 77 85   D-Dimer No results for input(s): DDIMER in the last 72 hours. Hgb A1c No results for input(s): HGBA1C in the last 72 hours. Lipid Profile No results for input(s): CHOL, HDL, LDLCALC, TRIG, CHOLHDL, LDLDIRECT in the last 72 hours. Thyroid function studies No results for  input(s): TSH, T4TOTAL, T3FREE, THYROIDAB in the last 72 hours.  Invalid input(s): FREET3 Anemia work up No results for input(s): VITAMINB12, FOLATE, FERRITIN, TIBC, IRON, RETICCTPCT in the last 72 hours. Urinalysis    Component Value Date/Time   COLORURINE YELLOW 06/27/2020 1544   APPEARANCEUR TURBID (A) 06/27/2020 1544   LABSPEC 1.014 06/27/2020 1544   PHURINE 6.0 06/27/2020 1544   GLUCOSEU NEGATIVE 06/27/2020 1544   HGBUR MODERATE (A) 06/27/2020 1544   BILIRUBINUR NEGATIVE 06/27/2020 1544   KETONESUR 5 (A) 06/27/2020 1544   PROTEINUR 100 (A) 06/27/2020 1544   NITRITE NEGATIVE 06/27/2020 1544   LEUKOCYTESUR MODERATE (A) 06/27/2020 1544   Sepsis Labs Invalid input(s): PROCALCITONIN,  WBC,  LACTICIDVEN Microbiology Recent Results (from the past 240 hour(s))  Resp Panel by RT-PCR (Flu A&B, Covid) Nasopharyngeal Swab     Status: None   Collection Time: 06/27/20  1:25 PM   Specimen: Nasopharyngeal Swab; Nasopharyngeal(NP) swabs in vial transport medium  Result Value Ref Range Status   SARS Coronavirus 2 by RT PCR NEGATIVE NEGATIVE Final    Comment: (NOTE) SARS-CoV-2 target nucleic acids are NOT DETECTED.  The SARS-CoV-2 RNA is generally detectable in upper respiratory specimens during the acute phase of infection. The lowest concentration of SARS-CoV-2 viral copies this assay can detect is 138 copies/mL. A negative result does not preclude SARS-Cov-2 infection and should  not be used as the sole basis for treatment or other patient management decisions. A negative result may occur with  improper specimen collection/handling, submission of specimen other than nasopharyngeal swab, presence of viral mutation(s) within the areas targeted by this assay, and inadequate number of viral copies(<138 copies/mL). A negative result must be combined with clinical observations, patient history, and epidemiological information. The expected result is Negative.  Fact Sheet for Patients:   EntrepreneurPulse.com.au  Fact Sheet for Healthcare Providers:  IncredibleEmployment.be  This test is no t yet approved or cleared by the [place] FDA and  has been authorized for detection and/or diagnosis of SARS-CoV-2 by FDA under an Emergency Use Authorization (EUA). This EUA will remain  in effect (meaning this test can be used) for the duration of the COVID-19 declaration under Section 564(b)(1) of the Act, 21 U.S.C.section 360bbb-3(b)(1), unless the authorization is terminated  or revoked sooner.       Influenza A by PCR NEGATIVE NEGATIVE Final   Influenza B by PCR NEGATIVE NEGATIVE Final    Comment: (NOTE) The Xpert Xpress SARS-CoV-2/FLU/RSV plus assay is intended as an aid in the diagnosis of influenza from Nasopharyngeal swab specimens and should not be used as a sole basis for treatment. Nasal washings and aspirates are unacceptable for Xpert Xpress SARS-CoV-2/FLU/RSV testing.  Fact Sheet for Patients: EntrepreneurPulse.com.au  Fact Sheet for Healthcare Providers: IncredibleEmployment.be  This test is not yet approved or cleared by the [place] FDA and has been authorized for detection and/or diagnosis of SARS-CoV-2 by FDA under an Emergency Use Authorization (EUA). This EUA will remain in effect (meaning this test can be used) for the duration of the COVID-19 declaration under Section 564(b)(1) of the Act, 21 U.S.C. section 360bbb-3(b)(1), unless the authorization is terminated or revoked.  Performed at [facility], [address] [place], [place] [address]   MRSA PCR Screening     Status: None   Collection Time: 06/27/20  6:00 PM   Specimen: Nasal Mucosa; Nasopharyngeal  Result Value Ref Range Status   MRSA by PCR NEGATIVE NEGATIVE Final    Comment:        The GeneXpert MRSA Assay (FDA approved for NASAL specimens only), is one component of a comprehensive MRSA  colonization surveillance program. It is not intended to diagnose MRSA infection nor to guide or monitor treatment for MRSA infections. Performed at [facility], [place] [address], [place], [place] [address]    Time coordinating discharge: 35 minutes  SIGNED:  Kerney Elbe, DO [facility] 07/07/2020, 1:17 PM Pager is on [place]  If 7PM-7AM, please contact night-coverage [email or web address]

## 2020-07-07 NOTE — Progress Notes (Signed)
Colostomy bag changed, site cleaned, stoma pink.

## 2020-07-07 NOTE — Progress Notes (Signed)
[facility] ROUNDING NOTE   Subjective:   Brief history.  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  He also has a remote history of Hodgkin's lymphoma. He had acute on chronic renal dysfunction with severe metabolic acidosis and altered mental status.  He was started on CRRT therapy 06/27/2020-06/30/2020.  Have discontinued CRRT therapy and monitoring renal function   Pt seen in room. No new c/o'.  Good UOP yest. Creat 4.0 stable today.     Objective:  Vital signs in last 24 hours:  Temp:  [97.9 F (36.6 C)-98.5 F (36.9 C)] 98.5 F (36.9 C) (04/08 1347) Pulse Rate:  [57-62] 61 (04/08 1347) Resp:  [15] 15 (04/08 1347) BP: (101-110)/(62-66) 108/62 (04/08 1347) SpO2:  [96 %-98 %] 98 % (04/08 1347) Weight:  [65.7 kg] 65.7 kg (04/08 0500)  Weight change: 4 kg Filed Weights   07/04/20 0819 07/06/20 0521 07/07/20 0500  Weight: 63.3 kg 61.7 kg 65.7 kg    Intake/Output: I/O last 3 completed shifts: In: 100 [I.V.:100] Out: 4025 [Urine:3675; Stool:350]   Intake/Output this shift:  Total I/O In: 480 [P.O.:480] Out: 1300 [Urine:1200; Stool:100]  Exam: Constitutional: sitting up in chair eating breakfast, no distress ENMT: ears and nose without scars or lesions, MMM CV: normal rate, no edema Respiratory: clear to auscultation, normal work of breathing Gastrointestinal: soft, non-tender, no palpable masses or hernias, +colostomy Skin: no visible lesions or rashes Neuro: nonfocal, Ox 3, conversant   Assessment/ Plan:  1. AKI on CKD 4 - CKD stage 4 at this time.  Multiple factors leading to acute injury.  Admitted with altered mental status severe metabolic acidosis.  SP CRRT 3/29- 06/30/20. Pt is significantly less deconditioned here now  then on prior admits last year. Pt would like to try HD if needed. He knows that he may or may not tolerate it well in the long-term setting. His creat is 4.0 and stable today and he can be dc'd from a renal standpoint.  He will f/u w/ his PCP and nephrologist at the [place]. He states he would like to be followed at [facility] for nephrology services but I told him that the [place] has to make a referral for that to happen, he expressed understanding.     2. Metabolic acidosis- resolved. 3. Sepsis - sp 7d course of IV abx.  4. Anemia ckd + other -  transfuse as needed if Hb < 7.0. 5. Urinary retention - pt has some urinary retention being rx'd by urology w/ foley cath and prazosin.  6. Bilat hydronephrosis-  chronic issue, urology did routine bilat ureteral stent exchanges on 4/7 here   Kelly Splinter, MD 07/07/2020, 4:02 PM   Basic Metabolic Panel: Recent Labs  Lab 07/02/20 0500 07/03/20 0500 07/04/20 0627 07/04/20 1142 07/05/20 0501 07/06/20 0456 07/07/20 0605  NA 138 135  --  139 139 138  138 137  139  K 4.1 3.9  --  3.6 3.6 3.9  3.8 4.0  4.0  CL 107 106  --  111 112* 109  109 109  109  CO2 25 24  --  23 21* 21*  21* 22  22  GLUCOSE 111* 99  --  97 86 78  79 81  83  BUN 18 20  --  22*  22* 25*  25* 27*  26*  CREATININE 2.89* 3.20*  --  3.69* 3.85* 3.98*  4.01* 4.07*  3.96*  CALCIUM 7.4* 7.1*  --  7.0* 6.9* 7.0*  7.0* 7.0*  7.0*  MG 2.2 1.9 1.8  --  1.7 1.7 1.7  PHOS 2.7  --   --  3.5 3.7 4.2  4.1 4.8*  5.0*   CBC: Recent Labs  Lab 07/03/20 0500 07/04/20 1142 07/05/20 0501 07/05/20 1554 07/06/20 0456 07/07/20 0605  WBC 4.0 3.7* 3.1*  --  3.8* 3.4*  NEUTROABS  --  1.8  --   --  1.7 1.6*  HGB 7.4* 7.3* 6.7* 9.4* 8.9* 8.5*  HCT 23.4* 23.5* 22.2* 29.6* 28.0* 28.0*  MCV 97.5 99.2 102.3*  --  95.9 100.7*  PLT 99* 130* 130*  --  154 162      Medications:    . (feeding supplement) PROSource Plus  30 mL Oral BID BM  . chlorhexidine  15 mL Mouth Rinse BID  . Chlorhexidine  Gluconate Cloth  6 each Topical Daily  . DULoxetine  30 mg Oral BID  . feeding supplement  237 mL Oral BID BM  . fentaNYL  1 patch Transdermal Q72H  . folic acid  1 mg Oral Daily  . gabapentin  300 mg Oral TID  . heparin  5,000 Units Subcutaneous Q8H  . mouth rinse  15 mL Mouth Rinse q12n4p  . pantoprazole  40 mg Oral Daily  . prazosin  1 mg Oral QHS  . sodium chloride flush  10-40 mL Intracatheter Q12H   acetaminophen, HYDROmorphone (DILAUDID) injection, hydrOXYzine, oxyCODONE, polyethylene glycol, sodium chloride flush    Kelly Splinter, MD 07/07/2020, 4:02 PM

## 2020-07-07 NOTE — TOC Transition Note (Signed)
Transition of Care [facility]) - CM/SW Discharge Note   Patient Details  Name: Chase [name] MRN: [id number] Date of Birth: 10/12/1965  [facility] CM/SW Contact:  Lynnell Catalan, RN Phone Number: 07/07/2020, 2:11 PM   Clinical Narrative:    Pt to dc home via [facility]. Address on chart confirmed with pt.    Final next level of care: [place] Barriers to Discharge: Continued Medical Work up   Patient Goals and CMS Choice Patient states their goals for this hospitalization and ongoing recovery are:: To go home CMS Medicare.gov Compare Post Acute Care list provided to:: Patient Choice offered to / list presented to : Patient  Discharge Plan and Services   Discharge Planning Services: CM Consult Post Acute Care Choice: University: RN,PT,OT [place] Agency: [facility] Date [place]: 07/04/20 Time [place]: 1204 Representative spoke with at [place]: [place] ([place]) Interventions     Readmission Risk Interventions Readmission Risk Prevention Plan 07/04/2020 12/25/2019 11/19/2019  Transportation Screening Complete Complete Complete  Medication Review Press photographer) Complete Complete Complete  PCP or Specialist appointment within 3-5 days of discharge Complete - Complete  HRI or Home Care Consult Complete - Complete  SW Recovery Care/Counseling Consult Complete - Complete  Palliative Care Screening Not Applicable - Not Applicable  Skilled Nursing Facility Not Applicable - Complete  Some recent data might be hidden

## 2020-09-12 ENCOUNTER — Other Ambulatory Visit: Payer: Self-pay | Admitting: Urology

## 2020-09-20 NOTE — Patient Instructions (Addendum)
DUE TO COVID-19 ONLY ONE VISITOR IS ALLOWED TO COME WITH YOU AND STAY IN THE WAITING ROOM ONLY DURING PRE OP AND PROCEDURE.   **NO VISITORS ARE ALLOWED IN THE SHORT STAY AREA OR RECOVERY ROOM!!**  IF YOU WILL BE ADMITTED INTO THE HOSPITAL YOU ARE ALLOWED ONLY TWO SUPPORT PEOPLE DURING VISITATION HOURS ONLY (10AM -8PM)   The support person(s) may change daily. The support person(s) must pass our screening, gel in and out, and wear a mask at all times, including in the patient's room. Patients must also wear a mask when staff or their support person are in the room.    COVID SWAB TESTING MUST BE COMPLETED ON:  10/03/20 @ 1000   [address] [place], [place] [address]   You are not required to quarantine, however you are required to wear a well-fitted mask when you are out and around people not in your household.  Hand Hygiene often Do NOT share personal items Notify your provider if you are in close contact with someone who has COVID or you develop fever 100.4 or greater, new onset of sneezing, cough, sore throat, shortness of breath or body aches.   Your procedure is scheduled on: 10/05/20   Report to [facility] Main  Entrance   Report to Short Stay at 5:15 AM   [facility])   Call this number if you have problems the morning of surgery [phone]   Do not eat food :After Midnight.   May have liquids until 4:15 AM day of surgery  CLEAR LIQUID DIET  Foods Allowed                                                                     Foods Excluded  Water, Black Coffee and tea, regular and decaf               liquids that you cannot  Plain Jell-O in any flavor  (No red)                                     see through such as: Fruit ices (not with fruit pulp)                                             milk, soups, orange juice              Iced Popsicles (No red)                                                 All solid food                                   Apple  juices Sports drinks like Gatorade (No red) Lightly seasoned clear broth or consume(fat free) Sugar, honey syrup   Oral Hygiene is also important  to reduce your risk of infection.                                    Remember - BRUSH YOUR TEETH THE MORNING OF SURGERY WITH YOUR REGULAR TOOTHPASTE   Do NOT smoke after Midnight   Take these medicines the morning of surgery with A SIP OF WATER: Acetaminophen, Duloxetine, Gabapentin, Hydroxyzine, Pantoprazole, Silodosin.                               You may not have any metal on your body including jewelry, and body piercing             Do not wear lotions, powders, cologne, or deodorant              Men may shave face and neck.   Do not bring valuables to the hospital. [place].   Bring small overnight bag day of surgery.              Please read over the following fact sheets you were given: IF YOU HAVE QUESTIONS ABOUT YOUR PRE OP INSTRUCTIONS PLEASE CALL [phone]   [place] - Preparing for Surgery Before surgery, you can play an important role.  Because skin is not sterile, your skin needs to be as free of germs as possible.  You can reduce the number of germs on your skin by washing with CHG (chlorahexidine gluconate) soap before surgery.  CHG is an antiseptic cleaner which kills germs and bonds with the skin to continue killing germs even after washing. Please DO NOT use if you have an allergy to CHG or antibacterial soaps.  If your skin becomes reddened/irritated stop using the CHG and inform your nurse when you arrive at Short Stay. Do not shave (including legs and underarms) for at least 48 hours prior to the first CHG shower.  You may shave your face/neck.  Please follow these instructions carefully:  1.  Shower with CHG Soap the night before surgery and the  morning of surgery.  2.  If you choose to wash your hair, wash your hair first as usual with your normal   shampoo.  3.  After you shampoo, rinse your hair and body thoroughly to remove the shampoo.                             4.  Use CHG as you would any other liquid soap.  You can apply chg directly to the skin and wash.  Gently with a scrungie or clean washcloth.  5.  Apply the CHG Soap to your body ONLY FROM THE NECK DOWN.   Do   not use on face/ open                           Wound or open sores. Avoid contact with eyes, ears mouth and   genitals (private parts).                       Wash face,  Genitals (private parts) with your normal soap.  6.  Wash thoroughly, paying special attention to the area where your    surgery  will be performed.  7.  Thoroughly rinse your body with warm water from the neck down.  8.  DO NOT shower/wash with your normal soap after using and rinsing off the CHG Soap.                9.  Pat yourself dry with a clean towel.            10.  Wear clean pajamas.            11.  Place clean sheets on your bed the night of your first shower and do not  sleep with pets. Day of Surgery : Do not apply any lotions/deodorants the morning of surgery.  Please wear clean clothes to the hospital/surgery center.  FAILURE TO FOLLOW THESE INSTRUCTIONS MAY RESULT IN THE CANCELLATION OF YOUR SURGERY  PATIENT SIGNATURE_________________________________  NURSE SIGNATURE__________________________________  ________________________________________________________________________

## 2020-09-20 NOTE — Progress Notes (Addendum)
COVID Vaccine Completed: yes x1 Date COVID Vaccine completed: Unsure Has received booster: No COVID vaccine manufacturer:  [employer]   Date of COVID positive in last 90 days: N/A  [facility]  [facility]iologist - [facility] at [facility]  Chest x-ray - 06/29/20 Epic EKG - 06/28/20 Epic Stress Test - N/A ECHO - 06/28/20 Epic Cardiac Cath - 06/08/18 Care Everywhere Pacemaker/ICD device last checked: N/A Spinal Cord Stimulator:N/A  Sleep Study - N/A CPAP -   Fasting Blood Sugar - N/A Checks Blood Sugar _____ times a day  Blood Thinner Instructions: N/A Aspirin Instructions: Last Dose:  Activity level: Pt repots he is unable to do stairs due to left sided weakness post CVA. He is able to walk with a walker and denies CP. Does endorse some SOB if he over exerts himself.    Anesthesia review: CVA with L sided residual weakness, acute respiratory failure, AV block, SVT, MI 2020  Patient denies shortness of breath, fever, cough and chest pain at PAT appointment   Patient verbalized understanding of instructions that were given to them at the PAT appointment. Patient was also instructed that they will need to review over the PAT instructions again at home before surgery.

## 2020-09-27 ENCOUNTER — Encounter (HOSPITAL_COMMUNITY)
Admission: RE | Admit: 2020-09-27 | Discharge: 2020-09-27 | Disposition: A | Discharge status: Home / Self Care | Payer: No Typology Code available for payment source | Source: Ambulatory Visit | Attending: Urology | Admitting: Urology

## 2020-09-27 ENCOUNTER — Encounter (HOSPITAL_COMMUNITY): Payer: Self-pay

## 2020-09-27 HISTORY — DX: Anemia, unspecified: D64.9

## 2020-09-27 HISTORY — DX: Headache, unspecified: R51.9

## 2020-09-27 HISTORY — DX: Dyspnea, unspecified: R06.00

## 2020-09-27 NOTE — Progress Notes (Signed)
PAT interview was done over the phone. Patient is coming in for labs on 10/04/20.

## 2020-10-03 ENCOUNTER — Other Ambulatory Visit (HOSPITAL_COMMUNITY)
Admission: RE | Admit: 2020-10-03 | Discharge: 2020-10-03 | Disposition: A | Discharge status: Home / Self Care | Payer: No Typology Code available for payment source | Source: Ambulatory Visit | Attending: Urology | Admitting: Urology

## 2020-10-03 DIAGNOSIS — N179 Acute kidney failure, unspecified: Secondary | ICD-10-CM | POA: Diagnosis not present

## 2020-10-03 DIAGNOSIS — Z20822 Contact with and (suspected) exposure to covid-19: Secondary | ICD-10-CM | POA: Insufficient documentation

## 2020-10-03 DIAGNOSIS — G9341 Metabolic encephalopathy: Secondary | ICD-10-CM | POA: Diagnosis not present

## 2020-10-03 DIAGNOSIS — Z01812 Encounter for preprocedural laboratory examination: Secondary | ICD-10-CM | POA: Insufficient documentation

## 2020-10-03 NOTE — Patient Instructions (Signed)
DUE TO COVID-19 ONLY ONE VISITOR IS ALLOWED TO COME WITH YOU AND STAY IN THE WAITING ROOM ONLY DURING PRE OP AND PROCEDURE.   **NO VISITORS ARE ALLOWED IN THE SHORT STAY AREA OR RECOVERY ROOM!!**  IF YOU WILL BE ADMITTED INTO THE HOSPITAL YOU ARE ALLOWED ONLY TWO SUPPORT PEOPLE DURING VISITATION HOURS ONLY (10AM -8PM)   The support person(s) may change daily. The support person(s) must pass our screening, gel in and out, and wear a mask at all times, including in the patient's room. Patients must also wear a mask when staff or their support person are in the room.  No visitors under the age of 38. Any visitor under the age of 65 must be accompanied by an adult.    COVID SWAB TESTING MUST BE COMPLETED ON:  10/03/20 @10 :00   [address] [place], [place] [address]   You are not required to quarantine, however you are required to wear a well-fitted mask when you are out and around people not in your household.  Hand Hygiene often Do NOT share personal items Notify your provider if you are in close contact with someone who has COVID or you develop fever 100.4 or greater, new onset of sneezing, cough, sore throat, shortness of breath or body aches.   Your procedure is scheduled on: 10/05/20   Report to [facility] Main  Entrance   Report to Short Stay at 5:15 AM   [facility])   Call this number if you have problems the morning of surgery [phone]   Do not eat food :After Midnight.   May have liquids until  4:15 AM  day of surgery  CLEAR LIQUID DIET  Foods Allowed                                                                     Foods Excluded  Water, Black Coffee and tea, regular and decaf               liquids that you cannot  Plain Jell-O in any flavor  (No red)                                    see through such as: Fruit ices (not with fruit pulp)                                            milk, soups, orange juice              Iced Popsicles (No red)                                                All solid food                                   Apple juices Sports drinks like Gatorade (No  red) Lightly seasoned clear broth or consume(fat free) Sugar, honey syrup     Oral Hygiene is also important to reduce your risk of infection.                                    Remember - BRUSH YOUR TEETH THE MORNING OF SURGERY WITH YOUR REGULAR TOOTHPASTE   Do NOT smoke after Midnight   Take these medicines the morning of surgery with A SIP OF WATER: Acetaminophen, Duloxetine, Gabapentin, Hydroxyzine, Pantoprazole, Silodosin.   DO NOT TAKE ANY ORAL DIABETIC MEDICATIONS DAY OF YOUR SURGERY                              You may not have any metal on your body including jewelry, and body piercing             Do not wear lotions, powders, cologne, or deodorant              Men may shave face and neck.   Do not bring valuables to the hospital. [place].   Contacts, dentures or bridgework may not be worn into surgery.   Bring small overnight bag day of surgery.   Special Instructions: Bring a copy of your healthcare power of attorney and living will documents         the day of surgery if you haven't scanned them in before.   Please read over the following fact sheets you were given: IF YOU HAVE QUESTIONS ABOUT YOUR PRE OP INSTRUCTIONS PLEASE CALL [phone]   [facility] - Preparing for Surgery Before surgery, you can play an important role.  Because skin is not sterile, your skin needs to be as free of germs as possible.  You can reduce the number of germs on your skin by washing with CHG (chlorahexidine gluconate) soap before surgery.  CHG is an antiseptic cleaner which kills germs and bonds with the skin to continue killing germs even after washing. Please DO NOT use if you have an allergy to CHG or antibacterial soaps.  If your skin becomes reddened/irritated stop using the CHG and inform your nurse when  you arrive at Short Stay. Do not shave (including legs and underarms) for at least 48 hours prior to the first CHG shower.  You may shave your face/neck.  Please follow these instructions carefully:  1.  Shower with CHG Soap the night before surgery and the  morning of surgery.  2.  If you choose to wash your hair, wash your hair first as usual with your normal  shampoo.  3.  After you shampoo, rinse your hair and body thoroughly to remove the shampoo.                             4.  Use CHG as you would any other liquid soap.  You can apply chg directly to the skin and wash.  Gently with a scrungie or clean washcloth.  5.  Apply the CHG Soap to your body ONLY FROM THE NECK DOWN.   Do   not use on face/ open  Wound or open sores. Avoid contact with eyes, ears mouth and   genitals (private parts).                       Wash face,  Genitals (private parts) with your normal soap.             6.  Wash thoroughly, paying special attention to the area where your    surgery  will be performed.  7.  Thoroughly rinse your body with warm water from the neck down.  8.  DO NOT shower/wash with your normal soap after using and rinsing off the CHG Soap.                9.  Pat yourself dry with a clean towel.            10.  Wear clean pajamas.            11.  Place clean sheets on your bed the night of your first shower and do not  sleep with pets. Day of Surgery : Do not apply any lotions/deodorants the morning of surgery.  Please wear clean clothes to the hospital/surgery center.  FAILURE TO FOLLOW THESE INSTRUCTIONS MAY RESULT IN THE CANCELLATION OF YOUR SURGERY  PATIENT SIGNATURE_________________________________  NURSE SIGNATURE__________________________________  ________________________________________________________________________

## 2020-10-04 ENCOUNTER — Encounter (HOSPITAL_COMMUNITY): Payer: Self-pay | Admitting: Certified Registered Nurse Anesthetist

## 2020-10-04 ENCOUNTER — Encounter (HOSPITAL_COMMUNITY)
Admission: RE | Admit: 2020-10-04 | Discharge: 2020-10-04 | Disposition: A | Discharge status: Home / Self Care | Payer: No Typology Code available for payment source | Source: Ambulatory Visit

## 2020-10-04 LAB — SARS CORONAVIRUS 2 (TAT 6-24 HRS): SARS Coronavirus 2: NEGATIVE

## 2020-10-04 NOTE — Progress Notes (Signed)
Called same day and Dr. Lynne Logan office (spoke with [facility]) and let them know pt did not show up for labs today or answer phone calls/voicemail.

## 2020-10-04 NOTE — Anesthesia Preprocedure Evaluation (Deleted)
Anesthesia Evaluation    Airway        Dental   Pulmonary  10/03/2020 SARS coronavirus NEG          Cardiovascular hypertension, Pt. on medications   05/2020 ECHO: EF 60-65%, normal LVF, no significant valvular abnormalities   Neuro/Psych  Headaches, PSYCHIATRIC DISORDERS (PTSD) Anxiety Depression CVA    GI/Hepatic GERD  Medicated,H/o gastric bypass   Endo/Other    Renal/GU Renal InsufficiencyRenal disease     Musculoskeletal   Abdominal   Peds  Hematology H/o Hodgkin's   Anesthesia Other Findings   Reproductive/Obstetrics                             Anesthesia Physical Anesthesia Plan  ASA: 3  Anesthesia Plan: General   Post-op Pain Management:    Induction: Intravenous  PONV Risk Score and Plan: 2 and Ondansetron and Dexamethasone  Airway Management Planned: LMA  Additional Equipment: None  Intra-op Plan:   Post-operative Plan:   Informed Consent:   Plan Discussed with:   Anesthesia Plan Comments:         Anesthesia Quick Evaluation

## 2020-10-04 NOTE — H&P (Addendum)
Chase [name] failed to show for his PST visit or his surgery this morning.  His surgery has therefore been canceled.

## 2020-10-04 NOTE — Progress Notes (Addendum)
Called pt three times throughout afternoon regarding lab visit today. No answer. Left voicemail to call back.

## 2020-10-05 ENCOUNTER — Ambulatory Visit (HOSPITAL_COMMUNITY)
Admission: RE | Admit: 2020-10-05 | Payer: No Typology Code available for payment source | Source: Ambulatory Visit | Admitting: Urology

## 2020-10-05 IMAGING — CT CT ABD-PELV W/O CM
2 of 4 series · 16 of 46 positions shown, 18 images · non-contrast
Comparison: December 24, 2019.

CLINICAL DATA: Abdominal pain.

EXAM:
CT ABDOMEN AND PELVIS WITHOUT CONTRAST
TECHNIQUE: Multidetector CT imaging of the abdomen and pelvis was performed
following the standard protocol without IV contrast.

[Series 2: axial st · axial · 0.79mm/px · z∈[-416,+14]mm · 13 of 98 slices shown, 15 images]
[im 6/98  soft-tissue]
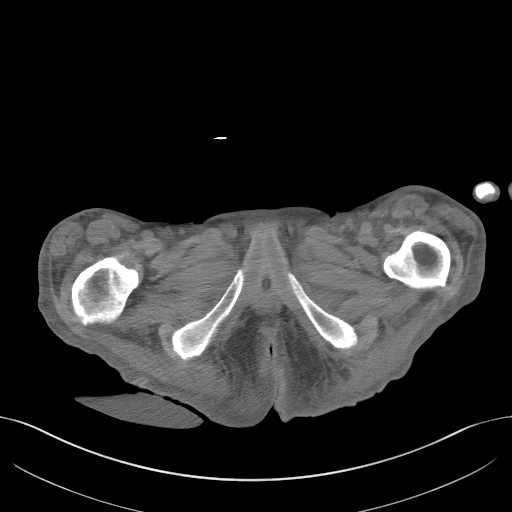
[im 6/98  bone]
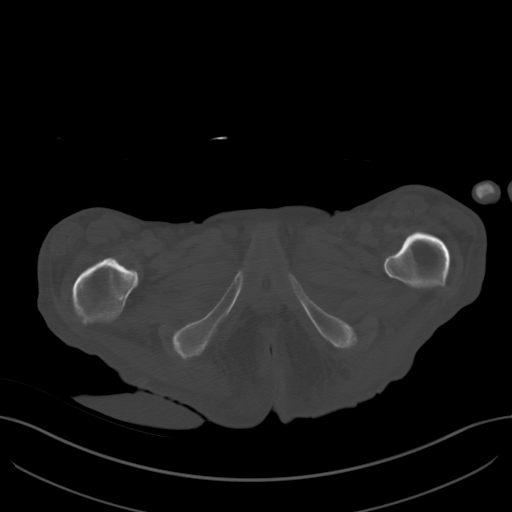
[im 16/98  soft-tissue]
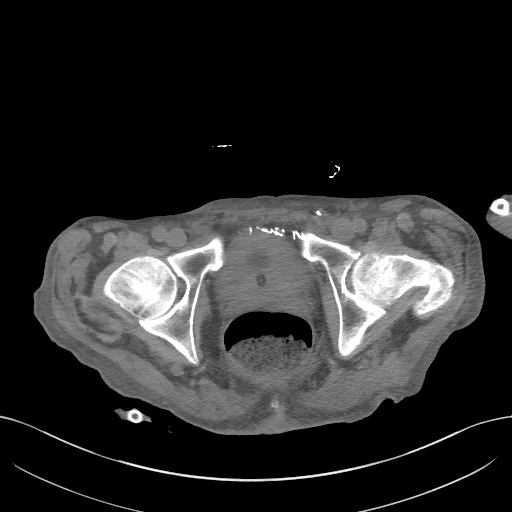
[im 21/98  soft-tissue]
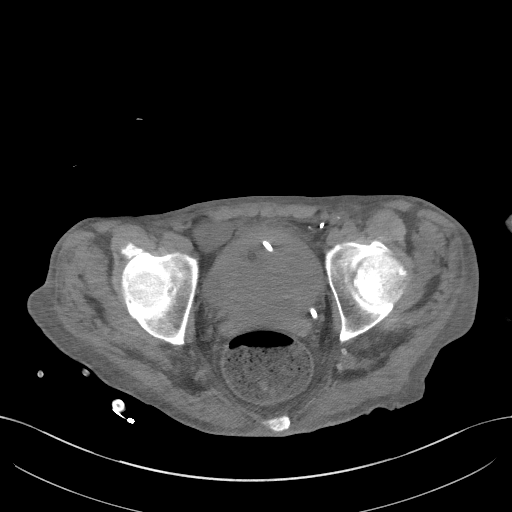
[im 26/98  soft-tissue]
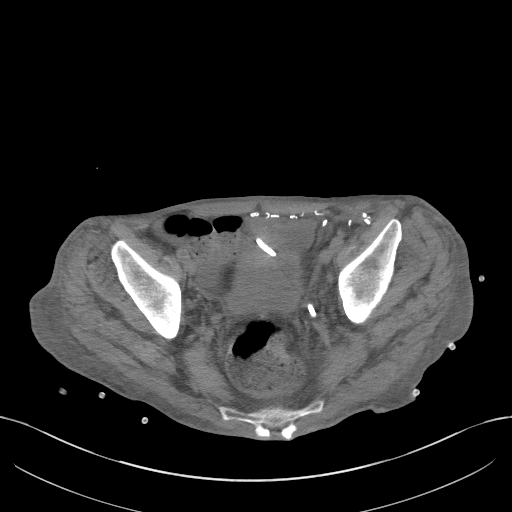
[im 36/98  soft-tissue]
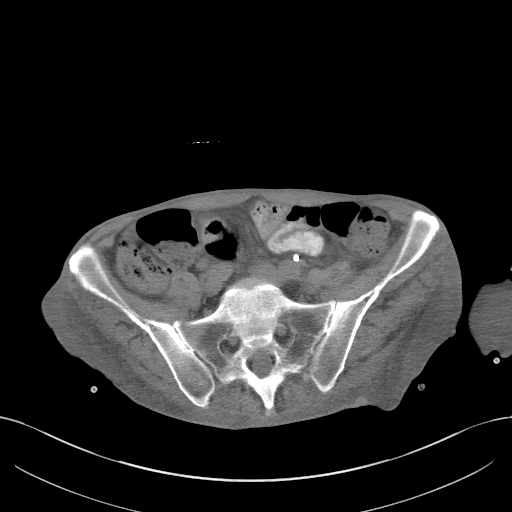
[im 41/98  soft-tissue]
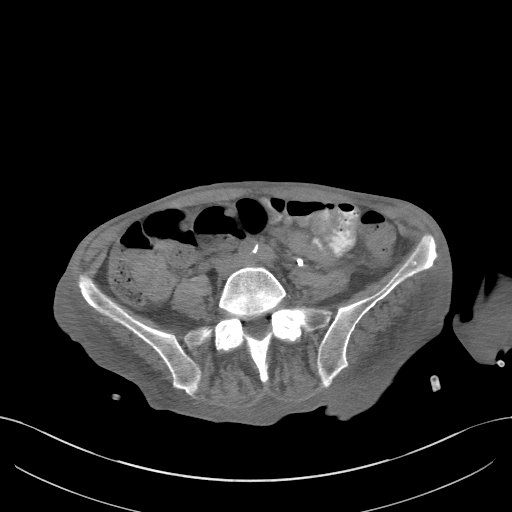
[im 52/98  soft-tissue]
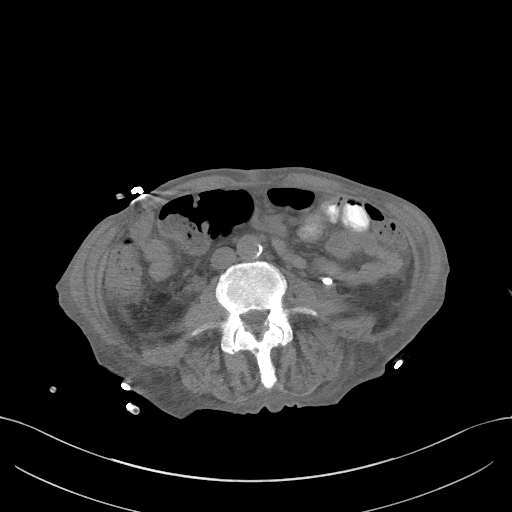
[im 57/98  soft-tissue]
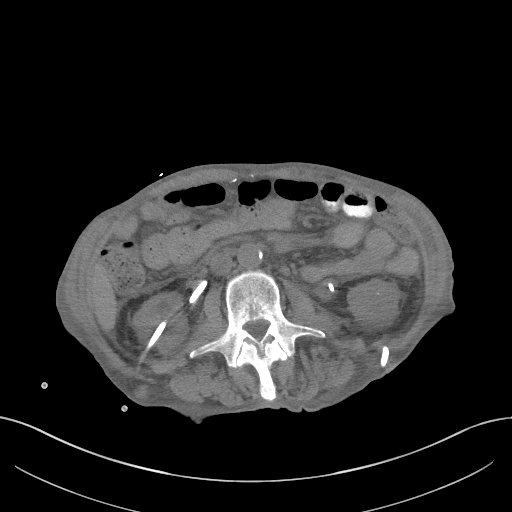
[im 62/98  soft-tissue]
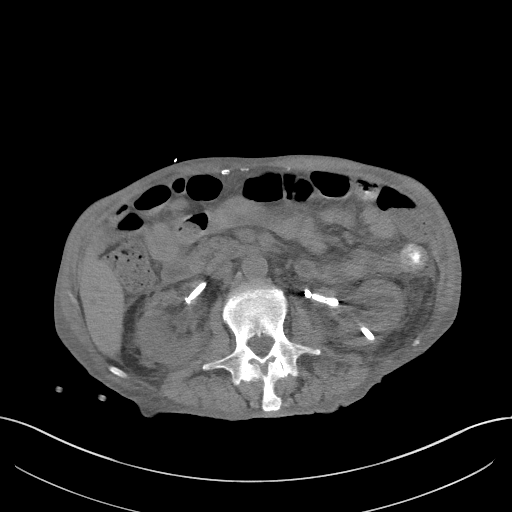
[im 62/98  bone]
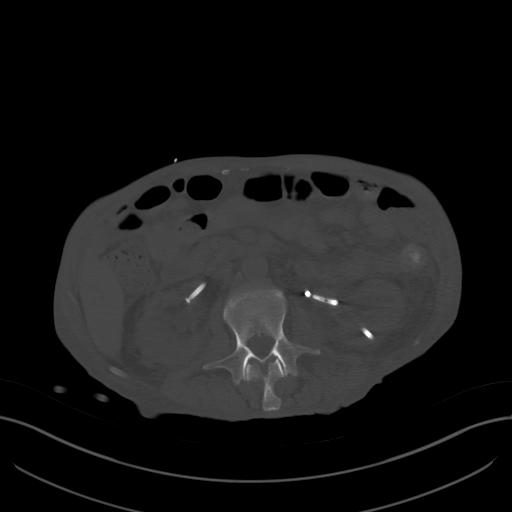
[im 72/98  soft-tissue]
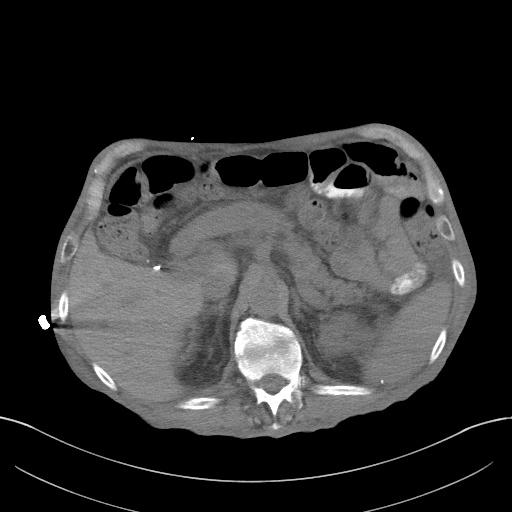
[im 77/98  soft-tissue]
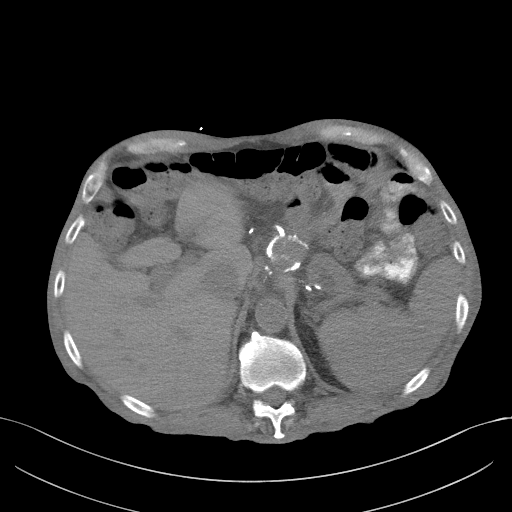
[im 82/98  soft-tissue]
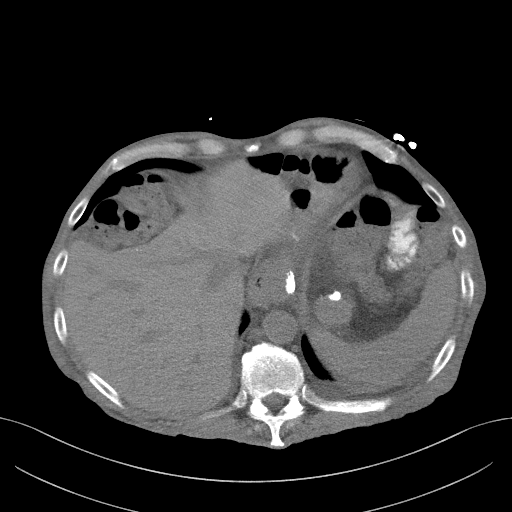
[im 92/98  soft-tissue]
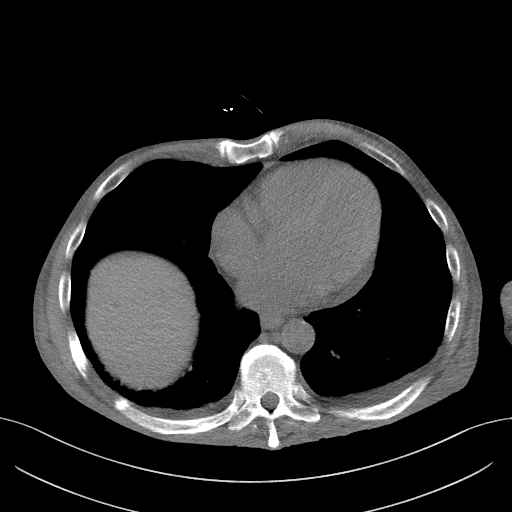

[Series 4: coronal st · coronal · 0.87mm/px · 3 of 96 slices shown]
[im 32/96  soft-tissue]
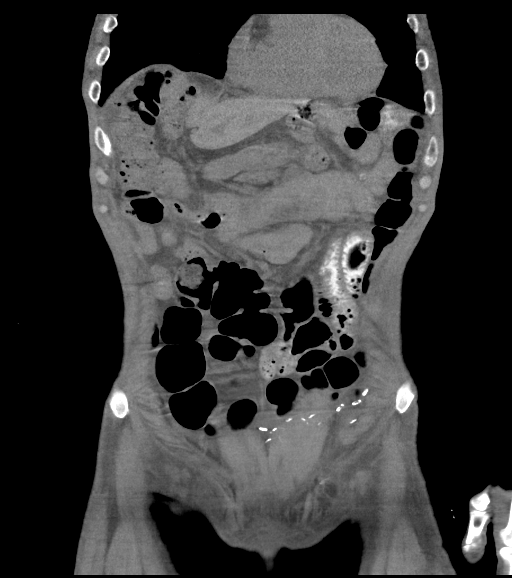
[im 43/96  soft-tissue]
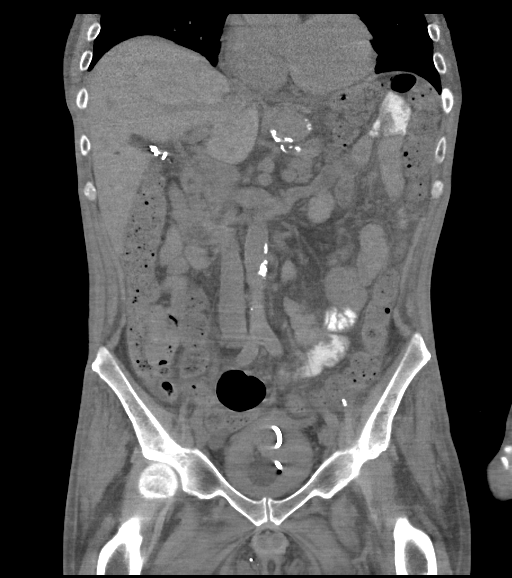
[im 53/96  soft-tissue]
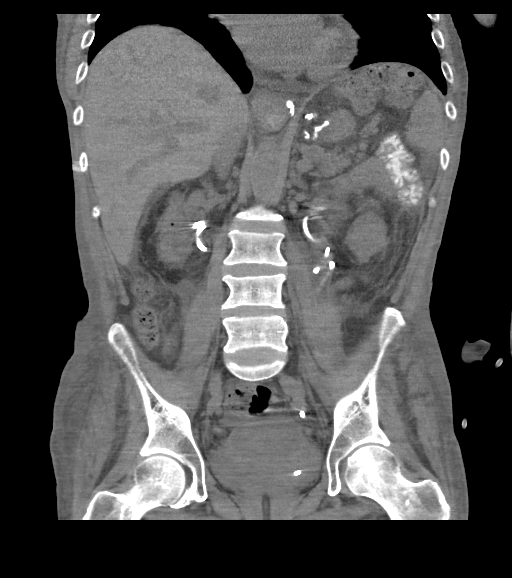

[16 of 46 positions shown; findings below may reference images not displayed]

FINDINGS: Lower chest: Small bilateral pleural effusions are noted with
adjacent subsegmental atelectasis.

Hepatobiliary: No focal liver abnormality is seen. Status post
cholecystectomy. No biliary dilatation.

Pancreas: Unremarkable. No pancreatic ductal dilatation or
surrounding inflammatory changes.

Spleen: Normal in size without focal abnormality.

Adrenals/Urinary Tract: Adrenal glands appear normal. Interval
placement of bilateral percutaneous nephrostomies. Left ureteral
stent is in grossly good position. Right ureteral stent noted on
prior exam has been removed. No definite hydronephrosis or renal
obstruction is seen at this time. Foley catheter is noted within the
urinary bladder. Perivesical fluid collection is again noted, which
currently measures 6.0 x 4.3 cm in the transverse orientation which
may be slightly enlarged compared to prior exam. Fluid within
urinary bladder appears to be high density suggesting possible
hemorrhage.

Stomach/Bowel: Status post gastric bypass. There is no evidence of
bowel obstruction or inflammation. The appendix appears normal.

Vascular/Lymphatic: Aortic atherosclerosis. No enlarged abdominal or
pelvic lymph nodes.

Reproductive: Prostate is unremarkable.

Other: Mild anasarca is noted.  No definite hernia is noted.

Musculoskeletal: No acute or significant osseous findings.
IMPRESSION: 1. Interval placement of bilateral percutaneous nephrostomies. Left
ureteral stent is in grossly good position. Right ureteral stent
noted on prior exam has been removed. No definite hydronephrosis or
renal obstruction is seen at this time.
2. Perivesical fluid collection is again noted, which currently
measures 6.0 x 4.3 cm in the transverse orientation which may be
slightly enlarged compared to prior exam. Fluid within urinary
bladder appears to be high density suggesting possible hemorrhage.
3. Small bilateral pleural effusions are noted with adjacent
subsegmental atelectasis.
4. Mild anasarca.
5. Aortic atherosclerosis.

Aortic Atherosclerosis ([id number]).

## 2020-10-05 IMAGING — US US PELVIS LIMITED
2 series · 14 of 23 positions shown · non-contrast
Comparison: Noncontrast CT earlier this day. Multiple prior
abdominopelvic CT.

CLINICAL DATA: Pelvic fluid collection.

EXAM:
LIMITED ULTRASOUND OF PELVIS
TECHNIQUE: Limited transabdominal ultrasound examination of the pelvis was
performed.

[Series 1: us pelvis limited · 9 of 15 slices shown (1 of 2)]
[im 1/15]
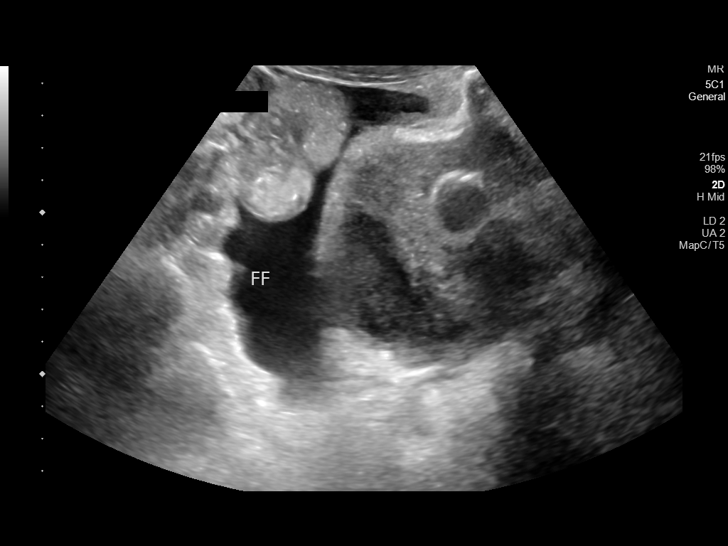
[im 3/15]
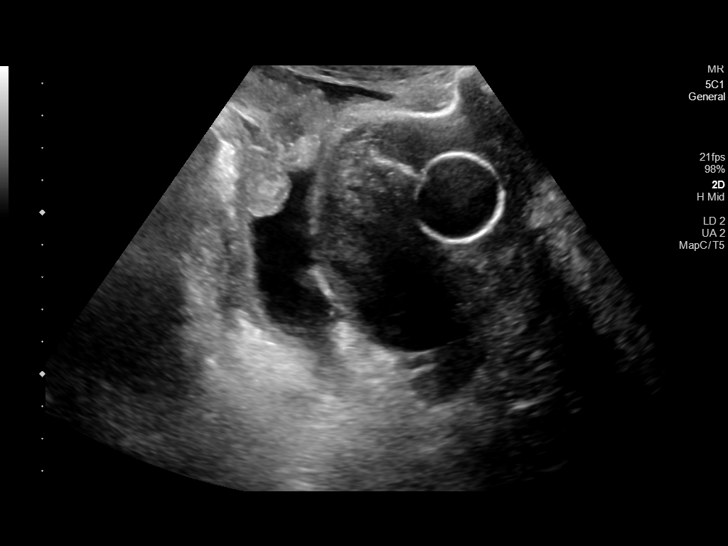
[im 5/15]
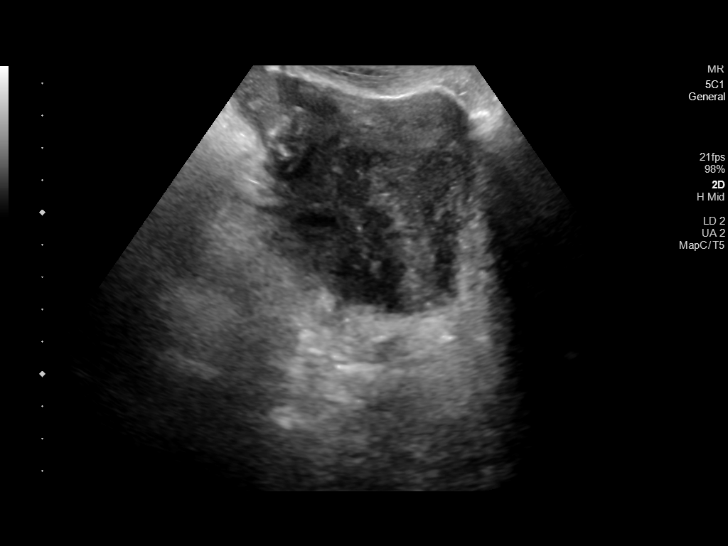
[im 6/15]
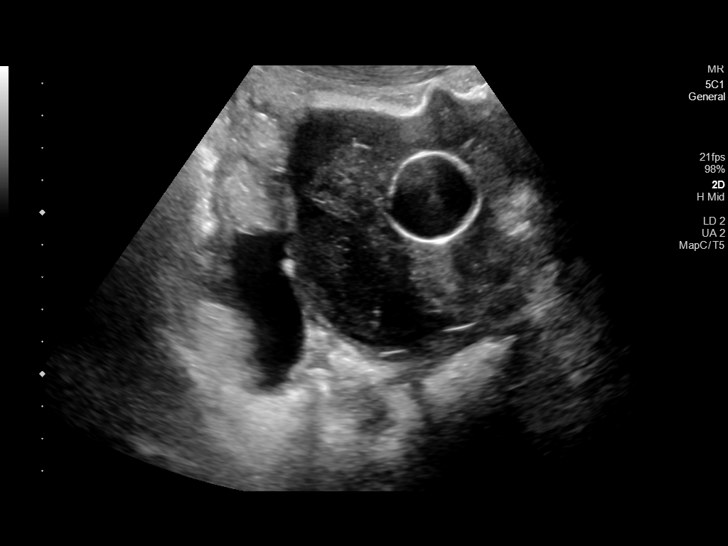
[im 8/15]
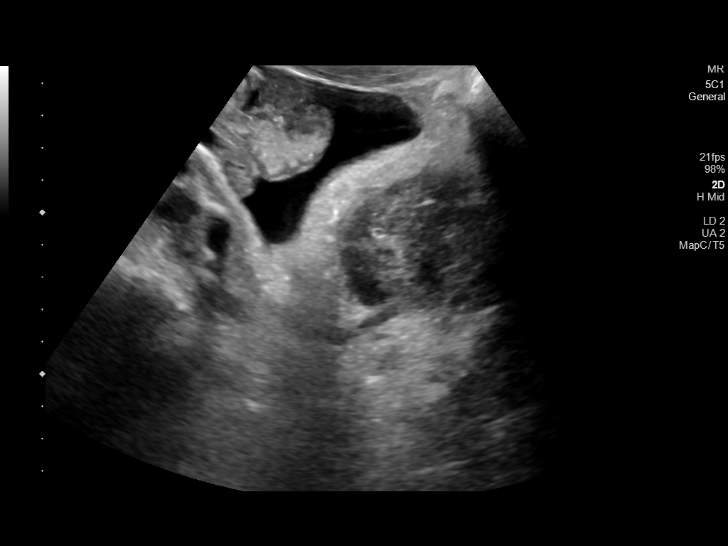
[im 10/15]
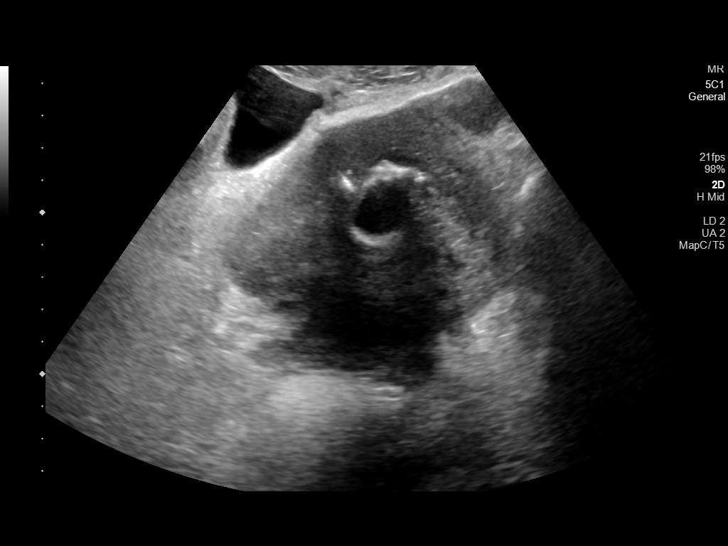
[im 11/15]
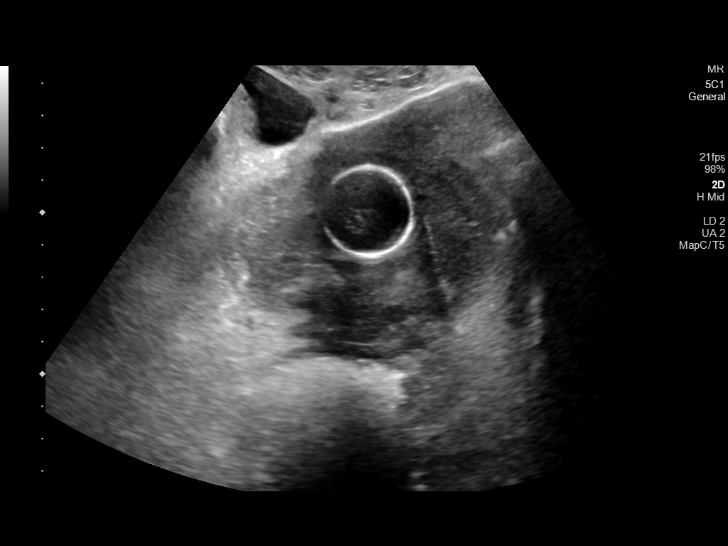
[im 13/15]
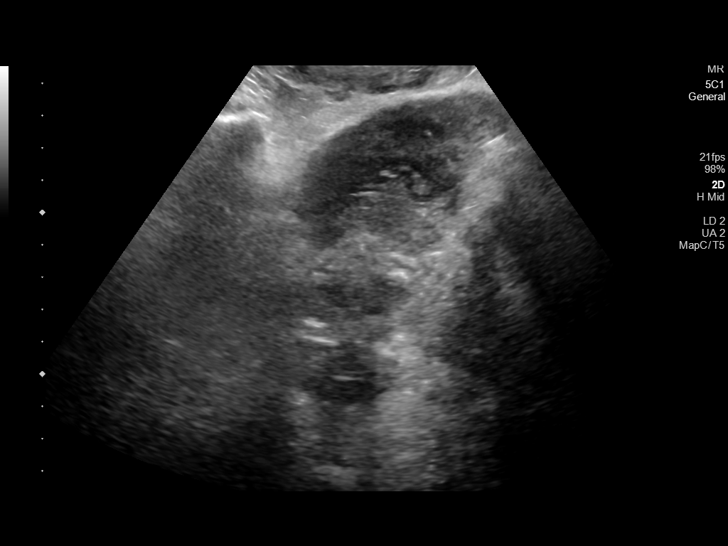
[im 14/15]
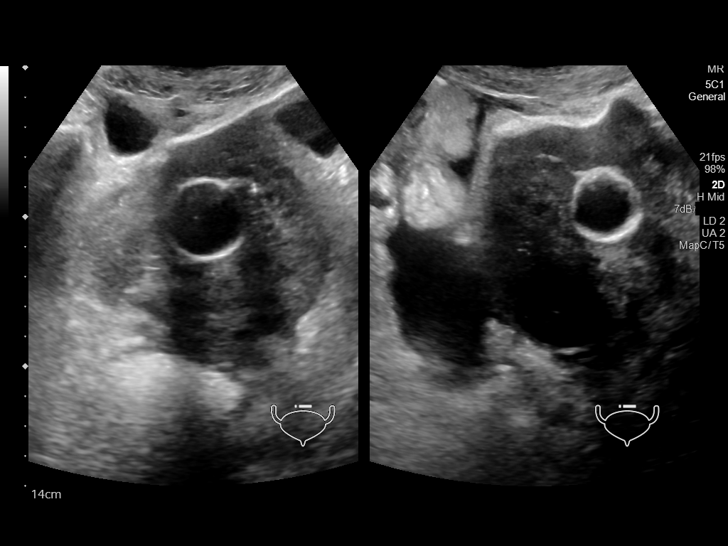

[Series 2: us pelvis limited · 5 of 8 slices shown (2 of 2)]
[im 1/8]
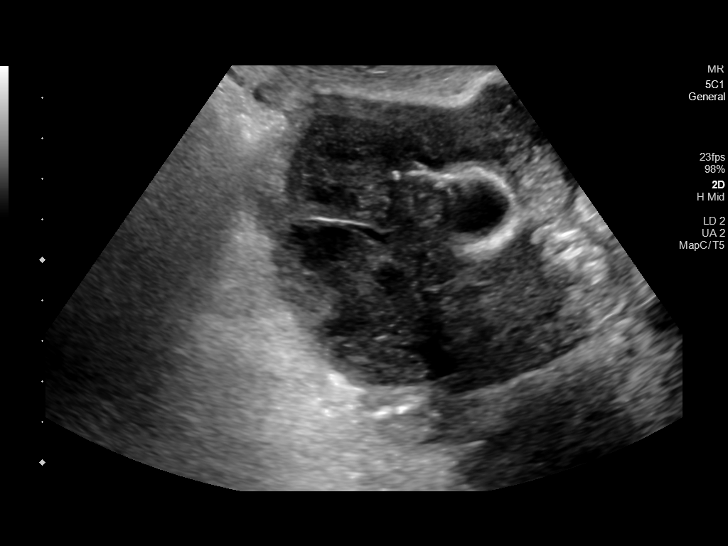
[im 3/8]
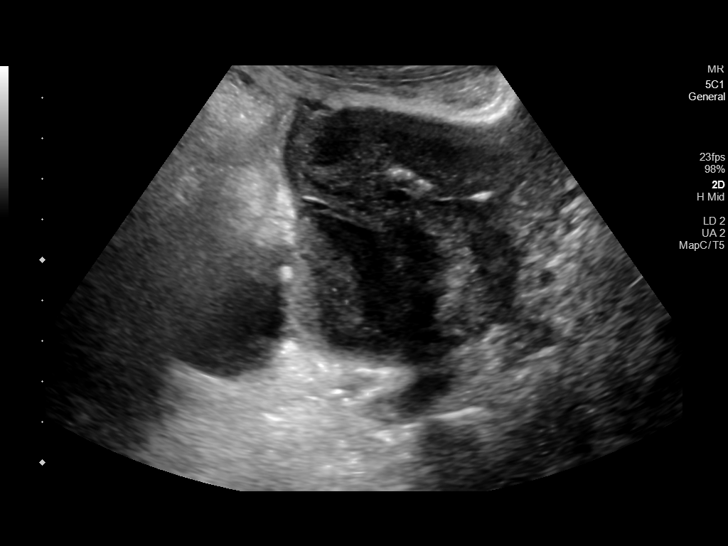
[im 4/8]
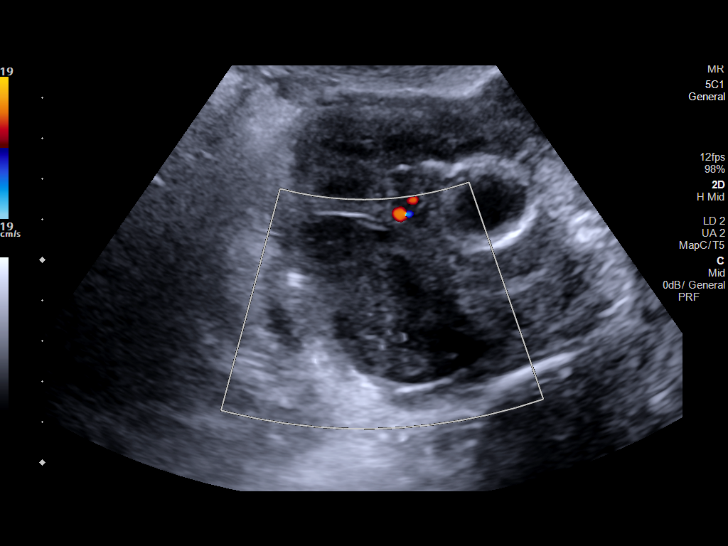
[im 6/8]
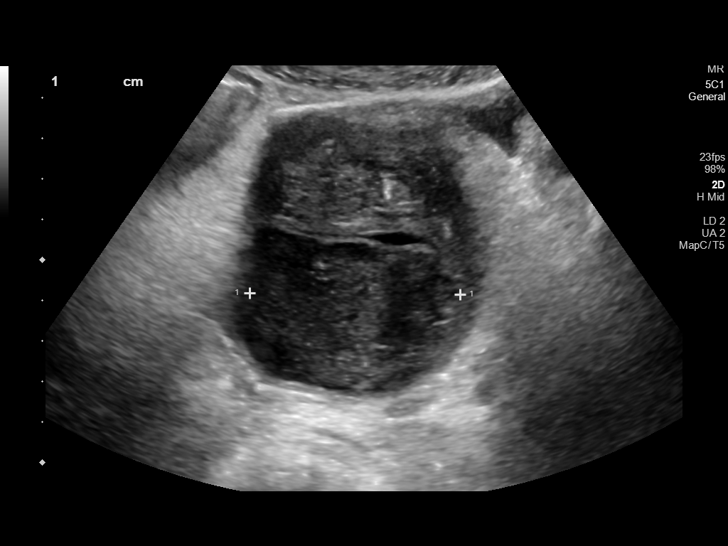
[im 8/8]
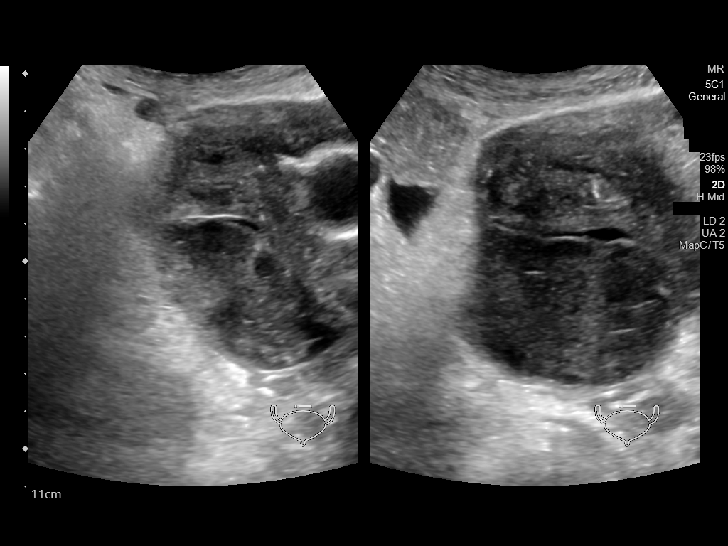

[14 of 23 positions shown; findings below may reference images not displayed]

FINDINGS: Foley catheter decompresses the urinary bladder. Posterior to the
catheter balloon is heterogeneous 5.3 x 3.0 x 5.2 cm avascular
lesion, corresponding to that seen on CT. There is no internal or
peripheral vascularity. There is a small amount of free fluid in the
pelvis.
IMPRESSION: 1. Heterogeneous lesion in the bladder wall posterior to the Foley
catheter balloon measuring 5.3 x 3.0 x 5.2 cm. It is unclear if this
represents a solid mass or walled off complex fluid collection.
2. Small amount of simple free fluid in the pelvis.

## 2020-10-05 SURGERY — TURP (TRANSURETHRAL RESECTION OF PROSTATE)
Anesthesia: General

## 2020-10-05 NOTE — Progress Notes (Signed)
Pt to have surgery 7/722. Pt did not show. Made multiple attempts to reach patient.

## 2020-10-06 ENCOUNTER — Emergency Department (HOSPITAL_COMMUNITY): Payer: No Typology Code available for payment source

## 2020-10-06 ENCOUNTER — Other Ambulatory Visit: Payer: Self-pay

## 2020-10-06 ENCOUNTER — Encounter (HOSPITAL_COMMUNITY): Payer: Self-pay

## 2020-10-06 ENCOUNTER — Inpatient Hospital Stay (HOSPITAL_COMMUNITY)
Admission: EM | Admit: 2020-10-06 | Discharge: 2020-10-26 | DRG: 673 | Disposition: A | Discharge status: Home Health | Payer: No Typology Code available for payment source | Attending: Internal Medicine | Admitting: Internal Medicine

## 2020-10-06 DIAGNOSIS — N400 Enlarged prostate without lower urinary tract symptoms: Secondary | ICD-10-CM | POA: Diagnosis present

## 2020-10-06 DIAGNOSIS — Z8249 Family history of ischemic heart disease and other diseases of the circulatory system: Secondary | ICD-10-CM

## 2020-10-06 DIAGNOSIS — N184 Chronic kidney disease, stage 4 (severe): Secondary | ICD-10-CM | POA: Diagnosis present

## 2020-10-06 DIAGNOSIS — I959 Hypotension, unspecified: Secondary | ICD-10-CM | POA: Diagnosis not present

## 2020-10-06 DIAGNOSIS — Z8711 Personal history of peptic ulcer disease: Secondary | ICD-10-CM | POA: Diagnosis not present

## 2020-10-06 DIAGNOSIS — R54 Age-related physical debility: Secondary | ICD-10-CM | POA: Diagnosis present

## 2020-10-06 DIAGNOSIS — Z992 Dependence on renal dialysis: Secondary | ICD-10-CM

## 2020-10-06 DIAGNOSIS — B952 Enterococcus as the cause of diseases classified elsewhere: Secondary | ICD-10-CM | POA: Diagnosis present

## 2020-10-06 DIAGNOSIS — N390 Urinary tract infection, site not specified: Secondary | ICD-10-CM

## 2020-10-06 DIAGNOSIS — F419 Anxiety disorder, unspecified: Secondary | ICD-10-CM | POA: Diagnosis present

## 2020-10-06 DIAGNOSIS — Z9119 Patient's noncompliance with other medical treatment and regimen: Secondary | ICD-10-CM

## 2020-10-06 DIAGNOSIS — N189 Chronic kidney disease, unspecified: Secondary | ICD-10-CM | POA: Diagnosis not present

## 2020-10-06 DIAGNOSIS — B9620 Unspecified Escherichia coli [E. coli] as the cause of diseases classified elsewhere: Secondary | ICD-10-CM | POA: Diagnosis present

## 2020-10-06 DIAGNOSIS — Z881 Allergy status to other antibiotic agents status: Secondary | ICD-10-CM

## 2020-10-06 DIAGNOSIS — D631 Anemia in chronic kidney disease: Secondary | ICD-10-CM | POA: Diagnosis present

## 2020-10-06 DIAGNOSIS — N186 End stage renal disease: Secondary | ICD-10-CM | POA: Diagnosis present

## 2020-10-06 DIAGNOSIS — E43 Unspecified severe protein-calorie malnutrition: Secondary | ICD-10-CM | POA: Diagnosis not present

## 2020-10-06 DIAGNOSIS — R64 Cachexia: Secondary | ICD-10-CM | POA: Diagnosis present

## 2020-10-06 DIAGNOSIS — R627 Adult failure to thrive: Secondary | ICD-10-CM | POA: Diagnosis present

## 2020-10-06 DIAGNOSIS — N136 Pyonephrosis: Secondary | ICD-10-CM | POA: Diagnosis present

## 2020-10-06 DIAGNOSIS — G894 Chronic pain syndrome: Secondary | ICD-10-CM | POA: Diagnosis present

## 2020-10-06 DIAGNOSIS — G9341 Metabolic encephalopathy: Secondary | ICD-10-CM | POA: Diagnosis present

## 2020-10-06 DIAGNOSIS — J982 Interstitial emphysema: Secondary | ICD-10-CM | POA: Diagnosis present

## 2020-10-06 DIAGNOSIS — R9431 Abnormal electrocardiogram [ECG] [EKG]: Secondary | ICD-10-CM | POA: Diagnosis not present

## 2020-10-06 DIAGNOSIS — M961 Postlaminectomy syndrome, not elsewhere classified: Secondary | ICD-10-CM | POA: Diagnosis not present

## 2020-10-06 DIAGNOSIS — E872 Acidosis, unspecified: Secondary | ICD-10-CM

## 2020-10-06 DIAGNOSIS — Z8051 Family history of malignant neoplasm of kidney: Secondary | ICD-10-CM

## 2020-10-06 DIAGNOSIS — Z8616 Personal history of COVID-19: Secondary | ICD-10-CM | POA: Diagnosis not present

## 2020-10-06 DIAGNOSIS — Z452 Encounter for adjustment and management of vascular access device: Secondary | ICD-10-CM

## 2020-10-06 DIAGNOSIS — L8990 Pressure ulcer of unspecified site, unspecified stage: Secondary | ICD-10-CM

## 2020-10-06 DIAGNOSIS — N401 Enlarged prostate with lower urinary tract symptoms: Secondary | ICD-10-CM | POA: Diagnosis present

## 2020-10-06 DIAGNOSIS — Z886 Allergy status to analgesic agent status: Secondary | ICD-10-CM

## 2020-10-06 DIAGNOSIS — Z95828 Presence of other vascular implants and grafts: Secondary | ICD-10-CM

## 2020-10-06 DIAGNOSIS — I69354 Hemiplegia and hemiparesis following cerebral infarction affecting left non-dominant side: Secondary | ICD-10-CM

## 2020-10-06 DIAGNOSIS — F32A Depression, unspecified: Secondary | ICD-10-CM | POA: Diagnosis present

## 2020-10-06 DIAGNOSIS — F4310 Post-traumatic stress disorder, unspecified: Secondary | ICD-10-CM | POA: Diagnosis present

## 2020-10-06 DIAGNOSIS — L89302 Pressure ulcer of unspecified buttock, stage 2: Secondary | ICD-10-CM | POA: Diagnosis present

## 2020-10-06 DIAGNOSIS — R338 Other retention of urine: Secondary | ICD-10-CM | POA: Diagnosis present

## 2020-10-06 DIAGNOSIS — Z933 Colostomy status: Secondary | ICD-10-CM

## 2020-10-06 DIAGNOSIS — N178 Other acute kidney failure: Secondary | ICD-10-CM | POA: Diagnosis not present

## 2020-10-06 DIAGNOSIS — Z20822 Contact with and (suspected) exposure to covid-19: Secondary | ICD-10-CM | POA: Diagnosis present

## 2020-10-06 DIAGNOSIS — Z79899 Other long term (current) drug therapy: Secondary | ICD-10-CM

## 2020-10-06 DIAGNOSIS — Z9884 Bariatric surgery status: Secondary | ICD-10-CM | POA: Diagnosis not present

## 2020-10-06 DIAGNOSIS — N138 Other obstructive and reflux uropathy: Secondary | ICD-10-CM | POA: Diagnosis present

## 2020-10-06 DIAGNOSIS — Z8571 Personal history of Hodgkin lymphoma: Secondary | ICD-10-CM

## 2020-10-06 DIAGNOSIS — N179 Acute kidney failure, unspecified: Principal | ICD-10-CM | POA: Diagnosis present

## 2020-10-06 DIAGNOSIS — T83511A Infection and inflammatory reaction due to indwelling urethral catheter, initial encounter: Secondary | ICD-10-CM

## 2020-10-06 DIAGNOSIS — I252 Old myocardial infarction: Secondary | ICD-10-CM

## 2020-10-06 DIAGNOSIS — N320 Bladder-neck obstruction: Secondary | ICD-10-CM | POA: Diagnosis present

## 2020-10-06 DIAGNOSIS — D6489 Other specified anemias: Secondary | ICD-10-CM | POA: Diagnosis present

## 2020-10-06 DIAGNOSIS — B965 Pseudomonas (aeruginosa) (mallei) (pseudomallei) as the cause of diseases classified elsewhere: Secondary | ICD-10-CM | POA: Diagnosis present

## 2020-10-06 DIAGNOSIS — Z800 Family history of malignant neoplasm of digestive organs: Secondary | ICD-10-CM

## 2020-10-06 DIAGNOSIS — Z66 Do not resuscitate: Secondary | ICD-10-CM | POA: Diagnosis not present

## 2020-10-06 DIAGNOSIS — Z515 Encounter for palliative care: Secondary | ICD-10-CM | POA: Diagnosis not present

## 2020-10-06 DIAGNOSIS — Z6822 Body mass index (BMI) 22.0-22.9, adult: Secondary | ICD-10-CM

## 2020-10-06 DIAGNOSIS — N185 Chronic kidney disease, stage 5: Secondary | ICD-10-CM | POA: Diagnosis not present

## 2020-10-06 DIAGNOSIS — Z7189 Other specified counseling: Secondary | ICD-10-CM | POA: Diagnosis not present

## 2020-10-06 DIAGNOSIS — E876 Hypokalemia: Secondary | ICD-10-CM | POA: Diagnosis not present

## 2020-10-06 DIAGNOSIS — N139 Obstructive and reflux uropathy, unspecified: Secondary | ICD-10-CM | POA: Diagnosis not present

## 2020-10-06 DIAGNOSIS — Z9049 Acquired absence of other specified parts of digestive tract: Secondary | ICD-10-CM

## 2020-10-06 LAB — RESP PANEL BY RT-PCR (FLU A&B, COVID) ARPGX2
Influenza A by PCR: NEGATIVE
Influenza B by PCR: NEGATIVE
SARS Coronavirus 2 by RT PCR: NEGATIVE

## 2020-10-06 LAB — CBC WITH DIFFERENTIAL/PLATELET
Abs Immature Granulocytes: 0.03 10*3/uL (ref 0.00–0.07)
Basophils Absolute: 0 10*3/uL (ref 0.0–0.1)
Basophils Relative: 0 %
Eosinophils Absolute: 0 10*3/uL (ref 0.0–0.5)
Eosinophils Relative: 0 %
HCT: 29 % — ABNORMAL LOW (ref 39.0–52.0)
Hemoglobin: 9.3 g/dL — ABNORMAL LOW (ref 13.0–17.0)
Immature Granulocytes: 1 %
Lymphocytes Relative: 8 %
Lymphs Abs: 0.5 10*3/uL — ABNORMAL LOW (ref 0.7–4.0)
MCH: 31.2 pg (ref 26.0–34.0)
MCHC: 32.1 g/dL (ref 30.0–36.0)
MCV: 97.3 fL (ref 80.0–100.0)
Monocytes Absolute: 0.8 10*3/uL (ref 0.1–1.0)
Monocytes Relative: 12 %
Neutro Abs: 4.7 10*3/uL (ref 1.7–7.7)
Neutrophils Relative %: 79 %
Platelets: 233 10*3/uL (ref 150–400)
RBC: 2.98 MIL/uL — ABNORMAL LOW (ref 4.22–5.81)
RDW: 16.2 % — ABNORMAL HIGH (ref 11.5–15.5)
WBC: 6 10*3/uL (ref 4.0–10.5)
nRBC: 0.3 % — ABNORMAL HIGH (ref 0.0–0.2)

## 2020-10-06 LAB — URINALYSIS, ROUTINE W REFLEX MICROSCOPIC
Bilirubin Urine: NEGATIVE
Glucose, UA: NEGATIVE mg/dL
Ketones, ur: 5 mg/dL — AB
Nitrite: NEGATIVE
Protein, ur: 100 mg/dL — AB
RBC / HPF: >50 RBC/hpf — ABNORMAL HIGH (ref 0–5)
Specific Gravity, Urine: 1.014 (ref 1.005–1.030)
WBC, UA: >50 WBC/hpf — ABNORMAL HIGH (ref 0–5)
pH: 6 (ref 5.0–8.0)

## 2020-10-06 LAB — BLOOD GAS, ARTERIAL
Acid-base deficit: 25.7 mmol/L — ABNORMAL HIGH (ref 0.0–2.0)
Bicarbonate: 6.1 mmol/L — ABNORMAL LOW (ref 20.0–28.0)
FIO2: 21
O2 Saturation: 97.5 %
Patient temperature: 36
pCO2 arterial: 12.9 mmHg — CL (ref 32.0–48.0)
pH, Arterial: 7.037 — CL (ref 7.350–7.450)
pO2, Arterial: 126 mmHg — ABNORMAL HIGH (ref 83.0–108.0)

## 2020-10-06 LAB — LACTIC ACID, PLASMA: Lactic Acid, Venous: 1 mmol/L (ref 0.5–1.9)

## 2020-10-06 LAB — COMPREHENSIVE METABOLIC PANEL
ALT: 54 U/L — ABNORMAL HIGH (ref 0–44)
AST: 14 U/L — ABNORMAL LOW (ref 15–41)
Albumin: 3.4 g/dL — ABNORMAL LOW (ref 3.5–5.0)
Alkaline Phosphatase: 129 U/L — ABNORMAL HIGH (ref 38–126)
BUN: 105 mg/dL — ABNORMAL HIGH (ref 6–20)
CO2: <7 mmol/L — ABNORMAL LOW (ref 22–32)
Calcium: 7.5 mg/dL — ABNORMAL LOW (ref 8.9–10.3)
Chloride: 119 mmol/L — ABNORMAL HIGH (ref 98–111)
Creatinine, Ser: 11.49 mg/dL — ABNORMAL HIGH (ref 0.61–1.24)
GFR, Estimated: 5 mL/min — ABNORMAL LOW (ref >60)
Glucose, Bld: 117 mg/dL — ABNORMAL HIGH (ref 70–99)
Potassium: 4.6 mmol/L (ref 3.5–5.1)
Sodium: 142 mmol/L (ref 135–145)
Total Bilirubin: 0.9 mg/dL (ref 0.3–1.2)
Total Protein: 7.6 g/dL (ref 6.5–8.1)

## 2020-10-06 LAB — RAPID URINE DRUG SCREEN, HOSP PERFORMED
Amphetamines: NOT DETECTED
Barbiturates: NOT DETECTED
Benzodiazepines: NOT DETECTED
Cocaine: NOT DETECTED
Opiates: NOT DETECTED
Tetrahydrocannabinol: NOT DETECTED

## 2020-10-06 LAB — BLOOD GAS, VENOUS
Acid-base deficit: 21.3 mmol/L — ABNORMAL HIGH (ref 0.0–2.0)
Bicarbonate: 8.2 mmol/L — ABNORMAL LOW (ref 20.0–28.0)
FIO2: 21
O2 Saturation: 56.5 %
Patient temperature: 37
pCO2, Ven: 23.5 mmHg — ABNORMAL LOW (ref 44.0–60.0)
pH, Ven: 7.083 — CL (ref 7.250–7.430)
pO2, Ven: 35.6 mmHg (ref 32.0–45.0)

## 2020-10-06 LAB — BASIC METABOLIC PANEL
Anion gap: 18 — ABNORMAL HIGH (ref 5–15)
BUN: 104 mg/dL — ABNORMAL HIGH (ref 6–20)
CO2: 7 mmol/L — ABNORMAL LOW (ref 22–32)
Calcium: 7.1 mg/dL — ABNORMAL LOW (ref 8.9–10.3)
Chloride: 118 mmol/L — ABNORMAL HIGH (ref 98–111)
Creatinine, Ser: 10.73 mg/dL — ABNORMAL HIGH (ref 0.61–1.24)
GFR, Estimated: 5 mL/min — ABNORMAL LOW (ref >60)
Glucose, Bld: 121 mg/dL — ABNORMAL HIGH (ref 70–99)
Potassium: 4.1 mmol/L (ref 3.5–5.1)
Sodium: 143 mmol/L (ref 135–145)

## 2020-10-06 LAB — CBG MONITORING, ED: Glucose-Capillary: 115 mg/dL — ABNORMAL HIGH (ref 70–99)

## 2020-10-06 MED ORDER — SODIUM CHLORIDE 0.9 % IV SOLN
1.0000 g | Freq: Once | INTRAVENOUS | Status: AC
  Administered 2020-10-06: 1 g via INTRAVENOUS
  Filled 2020-10-06: qty 10

## 2020-10-06 MED ORDER — SODIUM BICARBONATE 8.4 % IV SOLN
100.0000 meq | Freq: Once | INTRAVENOUS | Status: AC
  Administered 2020-10-07: 100 meq via INTRAVENOUS

## 2020-10-06 MED ORDER — HEPARIN SODIUM (PORCINE) 5000 UNIT/ML IJ SOLN
5000.0000 [IU] | Freq: Three times a day (TID) | INTRAMUSCULAR | Status: DC
  Administered 2020-10-07 – 2020-10-09 (×4): 5000 [IU] via SUBCUTANEOUS
  Filled 2020-10-06 (×6): qty 1

## 2020-10-06 MED ORDER — SODIUM BICARBONATE 8.4 % IV SOLN
INTRAVENOUS | Status: AC
  Filled 2020-10-06: qty 150

## 2020-10-06 MED ORDER — SODIUM CHLORIDE 0.9 % IV BOLUS
1000.0000 mL | Freq: Once | INTRAVENOUS | Status: AC
Start: 2020-10-06 — End: 2020-10-06
  Administered 2020-10-06: 1000 mL via INTRAVENOUS

## 2020-10-06 MED ORDER — SODIUM BICARBONATE 8.4 % IV SOLN
INTRAVENOUS | Status: DC
  Filled 2020-10-06 (×8): qty 1000

## 2020-10-06 MED ORDER — SODIUM BICARBONATE 8.4 % IV SOLN: 100.0000 meq | Freq: Once | INTRAVENOUS | Status: AC

## 2020-10-06 MED ORDER — SODIUM BICARBONATE 8.4 % IV SOLN
INTRAVENOUS | Status: AC
  Administered 2020-10-06: 100 meq via INTRAVENOUS
  Filled 2020-10-06: qty 50

## 2020-10-06 NOTE — ED Triage Notes (Signed)
Pt. Arrived from home via EMS. Per ems pt. Went home from [place] yesterday after having a urinary stent placed. Family called a welfare check due to pt. Not answering. Police on scene stated pt. Was unresponsive. EMS found pt. In chair. Pt. Is alert but unable to tell us where they are.

## 2020-10-06 NOTE — ED Notes (Signed)
Attempted to perform COVID swab, patient refused.

## 2020-10-06 NOTE — ED Notes (Signed)
Pts. Son called. Pts son is requesting a mental eval and for a social work consult. Per son they have been trying to get pt. Into a nursing home but pt is refusing.

## 2020-10-06 NOTE — ED Provider Notes (Signed)
[facility] EMERGENCY DEPARTMENT Provider Note   CSN: [id number] Arrival date & time: 10/06/20  1656     History Chief Complaint  Patient presents with   Altered Mental Status    Chase [name] is a 55 y.o. male.  Pt presents to the ED today with AMS.  Information obtained by EMS.  Per chart was supposed to have a TURP yesterday, but did not show up for pre op labs on the 6th or for the surgery on the 7th.  The family told EMS he went to [facility] yesterday to change out a urinary stent.  They called the police to do a welfare check.  They found pt unresponsive.  The pt has a complicated PMHX and has an indwelling foley catheter due to urinary retention from an enlarged prostate.  Pt's foley has frank pus inside the tubing and bag.  Pt not answering any questions.  He will only say "ow," but when I ask his if he is hurting, he says no.        Past Medical History:  Diagnosis Date   Anemia    Cervical radiculopathy    with left sided weakness   Chemical exposure    service related injury   Chronic pain    CKD (chronic kidney disease), stage IV ([place]) 10/17/2019   COVID-19    Dyspnea    GERD (gastroesophageal reflux disease)    GIB (gastrointestinal bleeding)    Headache    Heart attack ([place])    History of CVA (cerebrovascular accident) 12/31/2019   History of gastric bypass 08/24/2019   History of stomach ulcers 05/19/2013   Formatting of this note might be different from the original. Attributed to chronic ibuprofen use   Hodgkin lymphoma, unspecified, unspecified site ([place]) 12/10/2012   Formatting of this note might be different from the original. In remission.   Hypokalemia    Hypomagnesemia    PTSD (post-traumatic stress disorder)    Renal disorder    CKD   Stroke [facility])    Urinary retention    due to bladder outlet obstruction    Patient Active Problem List   Diagnosis Date Noted   Acute renal failure superimposed on stage 4 chronic kidney disease ([place]) 10/06/2020    Sepsis with acute renal failure (HCC)    Anemia of chronic disease    Acute urinary retention    Bilateral hydronephrosis    Acute metabolic encephalopathy    Acute kidney injury superimposed on chronic kidney disease ([place])    Delirium    Kidney failure 06/27/2020   Pressure injury of skin 06/27/2020   Physical deconditioning    Cachexia ([place]) 04/20/2020   Personality disorder  04/19/2020   Diverticulitis of sigmoid colon with abscess s/p colectomy/colostomy Jeanette Caprice) 04/18/2020 04/18/2020   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   Acute respiratory failure with hypoxia ([place])    DNR (do not resuscitate) discussion    Palliative care by specialist    Adult failure to thrive    Sepsis with acute renal failure without septic shock ([place]) 03/31/2020   Incisional hernia - epigastric 01/04/2020   Chronic narcotic use 01/02/2020   Anemia in chronic kidney disease 01/02/2020   ATN (acute tubular necrosis) ([place]) 12/31/2019   Candida UTI 12/31/2019   Electrolyte abnormality 12/31/2019   Increased anion gap metabolic acidosis 46/96/2952   Microcytic anemia 12/31/2019   ABLA (acute blood loss anemia) 12/31/2019   Anxiety and depression 12/31/2019   History of CVA (cerebrovascular accident)  12/31/2019   BMI less than 19,adult 12/31/2019   Bilateral ureteral obstruction s/p perc nephrosotmy & stenting 12/30/2019   AKI (acute kidney injury) ([place]) 12/24/2019   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 coli UTI (urinary tract infection) 11/15/2019   PTSD (post-traumatic stress disorder)    Chemical exposure    Cervical radiculopathy    Shock ([place]) 11/08/2019   Protein-calorie malnutrition, severe 10/23/2019   Cerebral embolism with cerebral infarction 10/18/2019   Acute left-sided weakness 10/17/2019   CKD (chronic kidney disease) stage 5, GFR less than 15 ml/min (HCC) 10/17/2019   Chronic pain 10/17/2019   Fall 10/17/2019   Pseudomonas urinary tract infection 08/24/2019    BPH (benign prostatic hyperplasia) 08/24/2019   Acute kidney failure ([place]) 08/24/2019   Anemia 08/24/2019   Dyspnea 08/24/2019   History of COVID-19 08/24/2019   History of elevated PSA 08/24/2019   History of gastric bypass 08/24/2019   Hyponatremia 48/18/5631   Metabolic acidosis 49/70/2637   Skin lesion of neck 85/88/5027   Toxic metabolic encephalopathy 74/03/8785   Cervical spondylosis without myelopathy 06/19/2016   Neck pain 09/04/2015   History of stomach ulcers 05/19/2013   Male erectile dysfunction 05/19/2013   Hodgkin lymphoma, unspecified, unspecified site ([place]) 12/10/2012   Iron deficiency anemia 12/10/2012   Pernicious anemia 12/10/2012   Arthralgia of multiple sites 04/20/2012   Postlaminectomy syndrome, not elsewhere classified 04/20/2012    Past Surgical History:  Procedure Laterality Date   CHOLECYSTECTOMY     COLONOSCOPY     COLONOSCOPY WITH PROPOFOL N/A 04/18/2020   Procedure: COLONOSCOPY WITH PROPOFOL;  Surgeon: Doran Stabler, MD;  Location: [facility] ENDOSCOPY;  Service: Gastroenterology;  Laterality: N/A;   CYSTOSCOPY W/ URETERAL STENT PLACEMENT N/A 11/14/2019   Procedure: CYSTOSCOPY WITH RETROGRADE PYELOGRAM bilateral Wyvonnia Dusky STENT PLACEMENT left fulguration bladder . cystogram;  Surgeon: Raynelle Bring, MD;  Location: [facility] ORS;  Service: Urology;  Laterality: N/A;   CYSTOSCOPY W/ URETERAL STENT PLACEMENT Bilateral 12/25/2019   Procedure: CYSTOSCOPY WITH bilateral  RETROGRADE PYELOGRAM/ leftURETERAL STENT PLACEMENT;  Surgeon: Lucas Mallow, MD;  Location: [facility] ORS;  Service: Urology;  Laterality: Bilateral;   CYSTOSCOPY W/ URETERAL STENT PLACEMENT Bilateral 07/06/2020   Procedure: CYSTOSCOPY WITH BILATERAL STENT EXCHANGE; BILATERAL PYELOGRAM RETROGRADE;  Surgeon: Raynelle Bring, MD;  Location: [facility] ORS;  Service: Urology;  Laterality: Bilateral;   CYSTOSCOPY WITH URETEROSCOPY AND STENT PLACEMENT N/A 04/18/2020   Procedure: CYSTOSCOPY WITH BILATERAL URETERAL  STENT CHANGE PLACEMENT;  Surgeon: Ardis Hughs, MD;  Location: [place];  Service: Urology;  Laterality: N/A;   EPIGASTRIC HERNIA REPAIR N/A 04/18/2020   Procedure: HERNIA REPAIR EPIGASTRIC ADULT;  Surgeon: Michael Boston, MD;  Location: [place];  Service: General;  Laterality: N/A;   GASTRIC BYPASS     HERNIA REPAIR     IR FLUORO GUIDE CV LINE RIGHT  04/01/2020   IR NEPHRO TUBE REMOV/FL  04/25/2020   IR NEPHROSTOMY PLACEMENT LEFT  12/27/2019   IR NEPHROSTOMY PLACEMENT LEFT  04/01/2020   IR NEPHROSTOMY PLACEMENT RIGHT  11/16/2019   IR NEPHROSTOMY PLACEMENT RIGHT  12/27/2019   IR NEPHROSTOMY PLACEMENT RIGHT  04/01/2020   IR SINUS/FIST TUBE CHK-NON GI  12/03/2019   IR URETERAL STENT PLACEMENT EXISTING ACCESS RIGHT  12/08/2019   IR URETERAL STENT PLACEMENT EXISTING ACCESS RIGHT  01/26/2020   IR US GUIDE VASC ACCESS RIGHT  04/01/2020   LAPAROSCOPIC LOOP COLOSTOMY N/A 04/18/2020   Procedure: LAPAROSCOPIC SIGMOID COLOTOMY WITH COLOSTOMY;  Surgeon: Johney Maine,  Remo Lipps, MD;  Location: [place];  Service: General;  Laterality: N/A;       Family History  Problem Relation Age of Onset   Renal cancer Father        mets to liver and lungs and pancreas   Heart disease Father    Heart disease Paternal Grandfather    Colon cancer Maternal Grandmother    Colon cancer Paternal Uncle    Stomach cancer Neg Hx    Esophageal cancer Neg Hx    Pancreatic cancer Neg Hx     Social History   Tobacco Use   Smoking status: Never   Smokeless tobacco: Current  Vaping Use   Vaping Use: Never used  Substance Use Topics   Alcohol use: Never   Drug use: Never    Home Medications Prior to Admission medications   Medication Sig Start Date End Date Taking? Authorizing Provider  acetaminophen (TYLENOL) 500 MG tablet Take 1 tablet (500 mg total) by mouth every 6 (six) hours as needed for mild pain, fever or headache. 05/01/20   Samella Parr, NP  calcium carbonate (TUMS - DOSED IN MG ELEMENTAL CALCIUM) 500 MG  chewable tablet Chew 1 tablet (200 mg of elemental calcium total) by mouth 3 (three) times daily. 05/01/20   Samella Parr, NP  cyanocobalamin (,VITAMIN B-12,) 1000 MCG/ML injection Inject 1,000 mcg into the muscle every 30 (thirty) days.  12/28/13   [provider]  docusate sodium (COLACE) 100 MG capsule Take 100 mg by mouth 2 (two) times daily as needed for mild constipation.    [provider]  DULoxetine (CYMBALTA) 30 MG capsule Take 1 capsule (30 mg total) by mouth 2 (two) times daily. 07/07/20 07/07/21  Raiford Noble Latif, DO  feeding supplement (ENSURE ENLIVE / ENSURE PLUS) LIQD Take 237 mLs by mouth 2 (two) times daily between meals. 07/07/20   Raiford Noble Latif, DO  fentaNYL (DURAGESIC) 25 MCG/HR Place 1 patch onto the skin every 3 (three) days. 07/08/20   Raiford Noble Latif, DO  folic acid (FOLVITE) 1 MG tablet Take 1 tablet (1 mg total) by mouth daily. 07/08/20   Raiford Noble Latif, DO  gabapentin (NEURONTIN) 100 MG capsule TAKE 2 CAPSULES (200 MG TOTAL) BY MOUTH DAILY. 05/03/20 05/03/21  Samella Parr, NP  gabapentin (NEURONTIN) 300 MG capsule TAKE 1 CAPSULE (300 MG TOTAL) BY MOUTH AT BEDTIME. 05/03/20 05/03/21  Samella Parr, NP  hydrOXYzine (ATARAX/VISTARIL) 10 MG tablet Take 1 tablet (10 mg total) by mouth 3 (three) times daily as needed for itching or anxiety. 07/07/20   Raiford Noble Latif, DO  Multiple Vitamin (MULTIVITAMIN WITH MINERALS) TABS tablet Take 1 tablet by mouth daily. 05/03/20   Samella Parr, NP  Nutritional Supplements (,FEEDING SUPPLEMENT, PROSOURCE PLUS) liquid Take 30 mLs by mouth 2 (two) times daily between meals. 07/07/20   Raiford Noble Latif, DO  oxyCODONE-acetaminophen (PERCOCET/ROXICET) 5-325 MG tablet Take 1 tablet by mouth every 4 (four) hours as needed for severe pain.    [provider]  pantoprazole (PROTONIX) 20 MG tablet TAKE 1 TABLET (20 MG TOTAL) BY MOUTH 2 (TWO) TIMES DAILY. 05/03/20 05/03/21  Samella Parr, NP  polycarbophil  (FIBERCON) 625 MG tablet Take 1 tablet (625 mg total) by mouth 2 (two) times daily. 05/01/20   Samella Parr, NP  polyethylene glycol (MIRALAX / GLYCOLAX) 17 g packet Take 17 g by mouth daily as needed for moderate constipation. 07/07/20   Kerney Elbe, DO  prazosin (MINIPRESS) 1 MG capsule Take 1 mg by mouth at bedtime.    [provider]  silodosin (RAPAFLO) 8 MG CAPS capsule Take 1 capsule (8 mg total) by mouth daily with breakfast. 07/07/20   Raiford Noble Latif, DO  sodium bicarbonate 650 MG tablet TAKE 1 TABLET (650 MG TOTAL) BY MOUTH 2 (TWO) TIMES DAILY. 05/03/20 05/03/21  Samella Parr, NP  Vitamin D3 (VITAMIN D) 25 MCG tablet TAKE 800 UNITS BY MOUTH DAILY. 05/03/20 05/03/21  Samella Parr, NP    Allergies    Nsaids and Ciprofloxacin  Review of Systems   Review of Systems  Unable to perform ROS: Mental status change   Physical Exam Updated Vital Signs BP 133/67   Pulse 67   Temp (!) 96.9 F (36.1 C) (Axillary)   Resp 11   Ht 5\' 6"  (1.676 m)   Wt 65.7 kg   SpO2 100%   BMI 23.38 kg/m   Physical Exam Vitals and nursing note reviewed.  Constitutional:      Appearance: He is cachectic. He is ill-appearing.  HENT:     Head: Normocephalic and atraumatic.     Right Ear: External ear normal.     Left Ear: External ear normal.     Nose: Nose normal.     Mouth/Throat:     Mouth: Mucous membranes are dry.  Eyes:     Extraocular Movements: Extraocular movements intact.     Conjunctiva/sclera: Conjunctivae normal.     Pupils: Pupils are equal, round, and reactive to light.  Cardiovascular:     Rate and Rhythm: Normal rate and regular rhythm.     Pulses: Normal pulses.     Heart sounds: Normal heart sounds.  Pulmonary:     Effort: Pulmonary effort is normal.     Breath sounds: Normal breath sounds.  Abdominal:     Comments: 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Musculoskeletal:        General: No swelling.     Cervical back: Normal range of motion and neck supple.  Skin:     General: Skin is warm.     Capillary Refill: Capillary refill takes less than 2 seconds.  Neurological:     Comments: Not answering questions or following commands    ED Results / Procedures / Treatments   Labs (all labs ordered are listed, but only abnormal results are displayed) Labs Reviewed  CBC WITH DIFFERENTIAL/PLATELET - Abnormal; Notable for the following components:      Result Value   RBC 2.98 (*)    Hemoglobin 9.3 (*)    HCT 29.0 (*)    RDW 16.2 (*)    nRBC 0.3 (*)    Lymphs Abs 0.5 (*)    All other components within normal limits  COMPREHENSIVE METABOLIC PANEL - Abnormal; Notable for the following components:   Chloride 119 (*)    CO2 <7 (*)    Glucose, Bld 117 (*)    BUN 105 (*)    Creatinine, Ser 11.49 (*)    Calcium 7.5 (*)    Albumin 3.4 (*)    AST 14 (*)    ALT 54 (*)    Alkaline Phosphatase 129 (*)    GFR, Estimated 5 (*)    All other components within normal limits  URINALYSIS, ROUTINE W REFLEX MICROSCOPIC - Abnormal; Notable for the following components:   APPearance TURBID (*)    Hgb urine dipstick SMALL (*)    Ketones, ur 5 (*)  Protein, ur 100 (*)    Leukocytes,Ua MODERATE (*)    RBC / HPF >50 (*)    WBC, UA >50 (*)    Bacteria, UA RARE (*)    All other components within normal limits  BLOOD GAS, ARTERIAL - Abnormal; Notable for the following components:   pH, Arterial 7.037 (*)    pCO2 arterial 12.9 (*)    pO2, Arterial 126 (*)    Bicarbonate 6.1 (*)    Acid-base deficit 25.7 (*)    Allens test (pass/fail) BRACHIAL ARTERY (*)    All other components within normal limits  CBG MONITORING, ED - Abnormal; Notable for the following components:   Glucose-Capillary 115 (*)    All other components within normal limits  CULTURE, BLOOD (ROUTINE X 2)  URINE CULTURE  CULTURE, BLOOD (ROUTINE X 2)  RESP PANEL BY RT-PCR (FLU A&B, COVID) ARPGX2  LACTIC ACID, PLASMA  RAPID URINE DRUG SCREEN, HOSP PERFORMED  BASIC METABOLIC PANEL    EKG EKG  Interpretation  Date/Time:  Friday October 06 2020 18:00:19 EDT Ventricular Rate:  68 PR Interval:  144 QRS Duration: 129 QT Interval:  426 QTC Calculation: 454 R Axis:   101 Text Interpretation: Sinus rhythm Nonspecific intraventricular conduction delay ST elevation, consider inferior injury Artifact in lead(s) I II III aVR aVL aVF V1 V2 V3 V4 V5 V6 pt is tremulous. No significant change since last tracing Confirmed by Isla Pence [id number]) on 10/06/2020 8:49:02 PM  Radiology DG Chest Portable 1 View  Result Date: 10/06/2020 CLINICAL DATA:  Altered mental status. EXAM: PORTABLE CHEST 1 VIEW COMPARISON:  Single-view of the chest 06/19/2020. FINDINGS: The patient is rotated on the study. Lungs clear. Heart size normal. No pneumothorax or pleural fluid. No acute or focal bony abnormality. There is partial visualization of a double-J stent in the right kidney. Surgical clips right upper quadrant noted. IMPRESSION: No acute disease. Electronically Signed   By: Inge Rise M.D.   On: 10/06/2020 19:04    Procedures Procedures   Medications Ordered in ED Medications  sodium bicarbonate 150 mEq in dextrose 5 % 1,150 mL infusion (has no administration in time range)  sodium chloride 0.9 % bolus 1,000 mL (0 mLs Intravenous Stopped 10/06/20 2000)  cefTRIAXone (ROCEPHIN) 1 g in sodium chloride 0.9 % 100 mL IVPB (0 g Intravenous Stopped 10/06/20 2015)  sodium bicarbonate injection 100 mEq (100 mEq Intravenous Given 10/06/20 2026)    ED Course  I have reviewed the triage vital signs and the nursing notes.  Pertinent labs & imaging results that were available during my care of the patient were reviewed by me and considered in my medical decision making (see chart for details).    MDM Rules/Calculators/A&P                          Pt has a metabolic acidosis from his renal failure.  K is ok.   Pt d/w Dr. Royce Macadamia (nephrology) who ordered a bicarb drip.  She recommended admission to [facility] as he is a  complicated patient and may need a urologic procedure and will need dialysis.    Pt also has an UTI.  Urine sent for culture.  Lactic is nl and bp and HR nl.  Doubt sepsis.  Pt d/w Dr. Olevia Bowens (triad) who will admit.  CRITICAL CARE Performed by: Isla Pence   Total critical care time: 45 minutes  Critical care time was exclusive of separately billable procedures  and treating other patients.  Critical care was necessary to treat or prevent imminent or life-threatening deterioration.  Critical care was time spent personally by me on the following activities: development of treatment plan with patient and/or surrogate as well as nursing, discussions with consultants, evaluation of patient's response to treatment, examination of patient, obtaining history from patient or surrogate, ordering and performing treatments and interventions, ordering and review of laboratory studies, ordering and review of radiographic studies, pulse oximetry and re-evaluation of patient's condition.   Final Clinical Impression(s) / ED Diagnoses Final diagnoses:  Metabolic encephalopathy  Acute renal failure superimposed on stage 5 chronic kidney disease, not on chronic dialysis, unspecified acute renal failure type ([place])  Metabolic acidosis  Urinary tract infection associated with indwelling urethral catheter, initial encounter [facility])    Rx / [place] Orders ED Discharge Orders     None        Isla Pence, MD 10/06/20 2100

## 2020-10-06 NOTE — Progress Notes (Signed)
[facility] night shift.  I spoke to Rayland Hamed (Son) at [phone] to give him an update on the clinical condition and treatment plan. He confirmed that he was DNR. He voiced similar concerns that he told the staff earlier about social services/BH evaluation and possible placement. He would like to be updated regularly as he lives over 4 hrs away from this area. He will be the primary contact and will notify his brother and the patient's mother.  Tennis Must, MD

## 2020-10-06 NOTE — ED Notes (Signed)
Date and time results received: 10/06/20 2334   Test: VBG pH Critical Value: 7.083  Name of Provider Notified: Olevia Bowens, MD

## 2020-10-06 NOTE — ED Notes (Signed)
Attempted to take the patients temperature orally, patient is unable to hold his mouth closed.  Told the patient that we needed to get a rectal temperature, patient refused.

## 2020-10-07 ENCOUNTER — Inpatient Hospital Stay (HOSPITAL_COMMUNITY): Payer: No Typology Code available for payment source

## 2020-10-07 ENCOUNTER — Other Ambulatory Visit: Payer: Self-pay

## 2020-10-07 DIAGNOSIS — N184 Chronic kidney disease, stage 4 (severe): Secondary | ICD-10-CM | POA: Diagnosis not present

## 2020-10-07 DIAGNOSIS — N179 Acute kidney failure, unspecified: Secondary | ICD-10-CM | POA: Diagnosis not present

## 2020-10-07 LAB — BASIC METABOLIC PANEL
Anion gap: 10 (ref 5–15)
BUN: 96 mg/dL — ABNORMAL HIGH (ref 6–20)
CO2: 19 mmol/L — ABNORMAL LOW (ref 22–32)
Calcium: 6.6 mg/dL — ABNORMAL LOW (ref 8.9–10.3)
Chloride: 114 mmol/L — ABNORMAL HIGH (ref 98–111)
Creatinine, Ser: 9.7 mg/dL — ABNORMAL HIGH (ref 0.61–1.24)
GFR, Estimated: 6 mL/min — ABNORMAL LOW (ref >60)
Glucose, Bld: 119 mg/dL — ABNORMAL HIGH (ref 70–99)
Potassium: 2.9 mmol/L — ABNORMAL LOW (ref 3.5–5.1)
Sodium: 143 mmol/L (ref 135–145)

## 2020-10-07 LAB — BLOOD GAS, VENOUS
Acid-base deficit: 13.3 mmol/L — ABNORMAL HIGH (ref 0.0–2.0)
Bicarbonate: 13.3 mmol/L — ABNORMAL LOW (ref 20.0–28.0)
FIO2: 21
O2 Saturation: 39 %
Patient temperature: 36.7
pCO2, Ven: 30.2 mmHg — ABNORMAL LOW (ref 44.0–60.0)
pH, Ven: 7.244 — ABNORMAL LOW (ref 7.250–7.430)
pO2, Ven: <31 mmHg — CL (ref 32.0–45.0)

## 2020-10-07 LAB — RENAL FUNCTION PANEL
Albumin: 2.4 g/dL — ABNORMAL LOW (ref 3.5–5.0)
Anion gap: 12 (ref 5–15)
BUN: 97 mg/dL — ABNORMAL HIGH (ref 6–20)
CO2: 18 mmol/L — ABNORMAL LOW (ref 22–32)
Calcium: 6.4 mg/dL — CL (ref 8.9–10.3)
Chloride: 113 mmol/L — ABNORMAL HIGH (ref 98–111)
Creatinine, Ser: 9.43 mg/dL — ABNORMAL HIGH (ref 0.61–1.24)
GFR, Estimated: 6 mL/min — ABNORMAL LOW (ref >60)
Glucose, Bld: 163 mg/dL — ABNORMAL HIGH (ref 70–99)
Phosphorus: 7.1 mg/dL — ABNORMAL HIGH (ref 2.5–4.6)
Potassium: 2.9 mmol/L — ABNORMAL LOW (ref 3.5–5.1)
Sodium: 143 mmol/L (ref 135–145)

## 2020-10-07 LAB — CBC
HCT: 21.7 % — ABNORMAL LOW (ref 39.0–52.0)
Hemoglobin: 7.5 g/dL — ABNORMAL LOW (ref 13.0–17.0)
MCH: 32.5 pg (ref 26.0–34.0)
MCHC: 34.6 g/dL (ref 30.0–36.0)
MCV: 93.9 fL (ref 80.0–100.0)
Platelets: 202 10*3/uL (ref 150–400)
RBC: 2.31 MIL/uL — ABNORMAL LOW (ref 4.22–5.81)
RDW: 15.9 % — ABNORMAL HIGH (ref 11.5–15.5)
WBC: 3.8 10*3/uL — ABNORMAL LOW (ref 4.0–10.5)
nRBC: 0.5 % — ABNORMAL HIGH (ref 0.0–0.2)

## 2020-10-07 LAB — MRSA NEXT GEN BY PCR, NASAL: MRSA by PCR Next Gen: NOT DETECTED

## 2020-10-07 LAB — COMPREHENSIVE METABOLIC PANEL
ALT: 40 U/L (ref 0–44)
AST: 16 U/L (ref 15–41)
Albumin: 2.7 g/dL — ABNORMAL LOW (ref 3.5–5.0)
Alkaline Phosphatase: 94 U/L (ref 38–126)
Anion gap: 18 — ABNORMAL HIGH (ref 5–15)
BUN: 98 mg/dL — ABNORMAL HIGH (ref 6–20)
CO2: 13 mmol/L — ABNORMAL LOW (ref 22–32)
Calcium: 7 mg/dL — ABNORMAL LOW (ref 8.9–10.3)
Chloride: 114 mmol/L — ABNORMAL HIGH (ref 98–111)
Creatinine, Ser: 10.24 mg/dL — ABNORMAL HIGH (ref 0.61–1.24)
GFR, Estimated: 5 mL/min — ABNORMAL LOW (ref >60)
Glucose, Bld: 113 mg/dL — ABNORMAL HIGH (ref 70–99)
Potassium: 2.9 mmol/L — ABNORMAL LOW (ref 3.5–5.1)
Sodium: 145 mmol/L (ref 135–145)
Total Bilirubin: 1.1 mg/dL (ref 0.3–1.2)
Total Protein: 5.8 g/dL — ABNORMAL LOW (ref 6.5–8.1)

## 2020-10-07 LAB — PROCALCITONIN
Procalcitonin: 0.17 ng/mL
Procalcitonin: 0.17 ng/mL

## 2020-10-07 LAB — TROPONIN I (HIGH SENSITIVITY)
Troponin I (High Sensitivity): 30 ng/L — ABNORMAL HIGH (ref <18)
Troponin I (High Sensitivity): 29 ng/L — ABNORMAL HIGH (ref <18)

## 2020-10-07 MED ORDER — POTASSIUM CHLORIDE 10 MEQ/100ML IV SOLN
10.0000 meq | INTRAVENOUS | Status: AC
  Administered 2020-10-07 (×3): 10 meq via INTRAVENOUS
  Filled 2020-10-07 (×3): qty 100

## 2020-10-07 MED ORDER — SODIUM CHLORIDE 0.9 % IV SOLN
250.0000 mL | INTRAVENOUS | Status: DC
  Administered 2020-10-07 – 2020-10-09 (×5): 250 mL via INTRAVENOUS

## 2020-10-07 MED ORDER — ONDANSETRON HCL 4 MG/2ML IJ SOLN
4.0000 mg | Freq: Four times a day (QID) | INTRAMUSCULAR | Status: DC | PRN
  Administered 2020-10-19: 4 mg via INTRAVENOUS
  Filled 2020-10-07: qty 2

## 2020-10-07 MED ORDER — PRISMASOL BGK 4/2.5 32-4-2.5 MEQ/L EC SOLN: Status: DC

## 2020-10-07 MED ORDER — FENTANYL CITRATE (PF) 100 MCG/2ML IJ SOLN
50.0000 ug | INTRAMUSCULAR | Status: DC | PRN
  Administered 2020-10-07 – 2020-10-19 (×47): 50 ug via INTRAVENOUS
  Filled 2020-10-07 (×50): qty 2

## 2020-10-07 MED ORDER — POTASSIUM CHLORIDE CRYS ER 20 MEQ PO TBCR
40.0000 meq | EXTENDED_RELEASE_TABLET | Freq: Once | ORAL | Status: AC
  Administered 2020-10-07: 40 meq via ORAL
  Filled 2020-10-07: qty 2

## 2020-10-07 MED ORDER — ATROPINE SULFATE 1 MG/10ML IJ SOSY
0.5000 mg | PREFILLED_SYRINGE | INTRAMUSCULAR | Status: DC | PRN
  Administered 2020-10-07 – 2020-10-08 (×5): 0.5 mg via INTRAVENOUS
  Filled 2020-10-07 (×5): qty 10

## 2020-10-07 MED ORDER — POTASSIUM CHLORIDE 10 MEQ/100ML IV SOLN
10.0000 meq | INTRAVENOUS | Status: DC
  Administered 2020-10-07 (×2): 10 meq via INTRAVENOUS
  Filled 2020-10-07 (×2): qty 100

## 2020-10-07 MED ORDER — LACTATED RINGERS IV BOLUS
1000.0000 mL | Freq: Once | INTRAVENOUS | Status: AC
  Administered 2020-10-07: 1000 mL via INTRAVENOUS

## 2020-10-07 MED ORDER — PIPERACILLIN-TAZOBACTAM 3.375 G IVPB
3.3750 g | Freq: Three times a day (TID) | INTRAVENOUS | Status: AC
  Administered 2020-10-07 – 2020-10-09 (×7): 3.375 g via INTRAVENOUS
  Filled 2020-10-07 (×7): qty 50

## 2020-10-07 MED ORDER — PIPERACILLIN-TAZOBACTAM 3.375 G IVPB
3.3750 g | Freq: Once | INTRAVENOUS | Status: AC
  Administered 2020-10-07: 3.375 g via INTRAVENOUS
  Filled 2020-10-07: qty 50

## 2020-10-07 MED ORDER — NOREPINEPHRINE 4 MG/250ML-% IV SOLN: 0.0000 ug/min | INTRAVENOUS | Status: DC

## 2020-10-07 MED ORDER — PANTOPRAZOLE SODIUM 40 MG PO TBEC
40.0000 mg | DELAYED_RELEASE_TABLET | Freq: Every day | ORAL | Status: DC
  Administered 2020-10-08 – 2020-10-25 (×17): 40 mg via ORAL
  Filled 2020-10-07 (×18): qty 1

## 2020-10-07 MED ORDER — PRISMASOL BGK 4/2.5 32-4-2.5 MEQ/L REPLACEMENT SOLN: Status: DC

## 2020-10-07 MED ORDER — CALCIUM GLUCONATE-NACL 2-0.675 GM/100ML-% IV SOLN
2.0000 g | Freq: Once | INTRAVENOUS | Status: AC
  Administered 2020-10-07: 2000 mg via INTRAVENOUS
  Filled 2020-10-07: qty 100

## 2020-10-07 MED ORDER — SODIUM CHLORIDE 0.9 % IV SOLN
3.0000 g | INTRAVENOUS | Status: DC
  Filled 2020-10-07: qty 8

## 2020-10-07 MED ORDER — MAGNESIUM SULFATE 2 GM/50ML IV SOLN
2.0000 g | Freq: Once | INTRAVENOUS | Status: AC
  Administered 2020-10-07: 2 g via INTRAVENOUS
  Filled 2020-10-07: qty 50

## 2020-10-07 MED ORDER — PIPERACILLIN-TAZOBACTAM 3.375 G IVPB 30 MIN
3.3750 g | Freq: Once | INTRAVENOUS | Status: DC
  Filled 2020-10-07: qty 50

## 2020-10-07 MED ORDER — CHLORHEXIDINE GLUCONATE CLOTH 2 % EX PADS
6.0000 | MEDICATED_PAD | Freq: Every day | CUTANEOUS | Status: DC
  Administered 2020-10-07 – 2020-10-26 (×16): 6 via TOPICAL

## 2020-10-07 MED ORDER — NOREPINEPHRINE 4 MG/250ML-% IV SOLN
2.0000 ug/min | INTRAVENOUS | Status: DC
  Administered 2020-10-07: 2 ug/min via INTRAVENOUS
  Filled 2020-10-07: qty 250

## 2020-10-07 MED ORDER — PIPERACILLIN-TAZOBACTAM 3.375 G IVPB: 3.3750 g | Freq: Two times a day (BID) | INTRAVENOUS | Status: DC

## 2020-10-07 MED ORDER — POTASSIUM CHLORIDE 10 MEQ/100ML IV SOLN: 10.0000 meq | INTRAVENOUS | Status: DC

## 2020-10-07 MED ORDER — HEPARIN SODIUM (PORCINE) 1000 UNIT/ML DIALYSIS
1000.0000 [IU] | INTRAMUSCULAR | Status: DC | PRN
  Administered 2020-10-08 – 2020-10-09 (×2): 3000 [IU] via INTRAVENOUS_CENTRAL
  Filled 2020-10-07 (×3): qty 6

## 2020-10-07 MED ORDER — ORAL CARE MOUTH RINSE
15.0000 mL | Freq: Two times a day (BID) | OROMUCOSAL | Status: DC
  Administered 2020-10-09 – 2020-10-26 (×21): 15 mL via OROMUCOSAL

## 2020-10-07 MED ORDER — HYDROCODONE-ACETAMINOPHEN 5-325 MG PO TABS
1.0000 | ORAL_TABLET | Freq: Four times a day (QID) | ORAL | Status: DC | PRN
  Administered 2020-10-07 – 2020-10-08 (×5): 2 via ORAL
  Administered 2020-10-09: 1 via ORAL
  Filled 2020-10-07 (×6): qty 2

## 2020-10-07 NOTE — Consult Note (Addendum)
NAME:  Chase [name], MRN:  [id number], DOB:  03/28/1966, LOS: 1 ADMISSION DATE:  10/06/2020, CONSULTATION DATE:  10/07/2020 REFERRING MD:  Dr. Wynetta Emery , CHIEF COMPLAINT:  Acute renal failure    History of Present Illness:  Chase [name] is a 55 y.o. male with a PMH significant for CKD stage IV, hx of GIB and ulcers requiring gastric bypass, prior stroke, hodgkin's lymphoma, prior MI, cervical radiculopathy, chronic pain syndrome, COVID, and PTSD who presented to the ED via EMS after being found minimally responsive on welfare check. On EMS arrival patient was found in recliner alert but confused. On chart review it appears patient was scheduled for pre op lab work 7/6 and TURP 7/7 but failed to show for either apt.   Patient has an extensive past medical history to include CKD stage 5 that progressed to acute renal failure march of this year and required CRRT. He also has indwelling catheter and bilateral nephrostomy tubes for hydronephrosis. He presents altered but in stable condition with no immediate needs for hemodialysis. Acidosis has improved with bicarb drip. Nephrology has already been consult.   Pertinent  Medical History  Significant for but not limited to: CKD stage IV, hx of GIB and ulcers requiring gastric bypass, prior stroke, hodgkin's lymphoma, prior MI, cervical radiculopathy, chronic pain syndrome, COVID, and PTSD   Significant Hospital Events:  7/8 admitted per [facility] at [facility] for acute renal failure with multiple metabolic derangements 7/9 Transferred from [facility] to [facility] with PCCM and nephrology consult   Interim History / Subjective:  As above  Objective   Blood pressure (!) 96/49, pulse (!) 42, temperature 98.1 F (36.7 C), temperature source Oral, resp. rate 14, height 5\' 6"  (1.676 m), weight 65.7 kg, SpO2 100 %.        Intake/Output Summary (Last 24 hours) at 10/07/2020 1215 Last data filed at 10/07/2020 0607 Gross per 24 hour  Intake 2300 ml  Output --  Net 2300 ml    Filed Weights   10/06/20 1711  Weight: 65.7 kg    Examination: General: Thin cachetic chronically ill appearing elderly male lying in bed, in NAD HEENT: [place]/AT, MM pink/moist, PERRL Neuro: Alert but only oriented to self, non-focal  CV: s1s2 regular rate and rhythm, no murmur, rubs, or gallops,  PULM:  clear to ascultation, no increased work of breathing, no added breath sounds GI: soft, bowel sounds active in all 4 quadrants, non-tender, non-distended Extremities: warm/dry, no edema  Skin: no rashes or lesions  Resolved Hospital Problem list     Assessment & Plan:  Acute renal failure superimposed on CKD stage IV -Creatinine on discharge from last hospitalitis was 4.07, Creatinine on admission 11.49, BUN 105, no hyperkalemia  P: Nephrology consulted, appreciate assistance  Will likely need CRRT  Follow renal function  Monitor urine output Trend Bmet Avoid nephrotoxins Ensure adequate renal perfusion   Hx of bilateral hydronephrosis with chronic indwelling foley  Hx of BPH  -Per chart review bilateral nephrostomy tube were last exchanged 10/02/2020 -Was scheduled to see Dr. Alinda Money 7/722 for urology procedure (unsure if this was a stent exchange vs TRUP) but failed to present to visit P: Consult urology  UA with rare bacteria with appearance of puss  Empiric antibiotics Foley exchange pending GOC If aggressive measures are pursued will need to consult urology to determine need for future TURP  Acute metabolic encephalopathy secondary to renal failure and uremia  P: Management per neurology  Maintain neuro protective measures; goal  for eurothermia, euglycemia, eunatermia, normoxia, and PCO2 goal of 35-40 Nutrition and bowel regiment  Seizure precautions  Aspirations precautions   Metabolic acidosis  -pH 0.92 with bicarb 6.1 and CO2 12.9 on admit  P: Continue Bicarb drip  Treat underlying cause   Hypokalemia  P: Supplement   Anemia of chronic disease -Hgb 7.5  on admit  P: Trend CBC  Transfuse per protocol  Hgb goal > 7  Failure to thrive  Deconditioning  P: Patient presents after being found down on wellness check with what appears to be failure to care for self as he has failed to follow up on multiple occasions and severe deconditioning. Patient has been seen by palliative care previously. Will need to coordinate with all consulting providers to help determine next best care. Patient is likely poor candidate for long term hemodialysis.    Best Practice  Diet/type: NPO DVT prophylaxis: systemic heparin GI prophylaxis: PPI Lines: N/A Foley:  N/A Code Status:  DNR Last date of multidisciplinary goals of care discussion Pending   Labs   CBC: Recent Labs  Lab 10/06/20 1805 10/07/20 0535  WBC 6.0 3.8*  NEUTROABS 4.7  --   HGB 9.3* 7.5*  HCT 29.0* 21.7*  MCV 97.3 93.9  PLT 233 330    Basic Metabolic Panel: Recent Labs  Lab 10/06/20 1805 10/06/20 2312 10/07/20 0535 10/07/20 1100  NA 142 143 145 143  K 4.6 4.1 2.9* 2.9*  CL 119* 118* 114* 114*  CO2 <7* 7* 13* 19*  GLUCOSE 117* 121* 113* 119*  BUN 105* 104* 98* 96*  CREATININE 11.49* 10.73* 10.24* 9.70*  CALCIUM 7.5* 7.1* 7.0* 6.6*   GFR: Estimated Creatinine Clearance: 7.7 mL/min (A) (by C-G formula based on SCr of 9.7 mg/dL (H)). Recent Labs  Lab 10/06/20 1746 10/06/20 1805 10/06/20 2312 10/07/20 0535  PROCALCITON  --   --  0.17 0.17  WBC  --  6.0  --  3.8*  LATICACIDVEN 1.0  --   --   --     Liver Function Tests: Recent Labs  Lab 10/06/20 1805 10/07/20 0535  AST 14* 16  ALT 54* 40  ALKPHOS 129* 94  BILITOT 0.9 1.1  PROT 7.6 5.8*  ALBUMIN 3.4* 2.7*   No results for input(s): LIPASE, AMYLASE in the last 168 hours. No results for input(s): AMMONIA in the last 168 hours.  ABG    Component Value Date/Time   PHART 7.037 (LL) 10/06/2020 1830   PCO2ART 12.9 (LL) 10/06/2020 1830   PO2ART 126 (H) 10/06/2020 1830   HCO3 13.3 (L) 10/07/2020 0535   TCO2  23 04/18/2020 1205   ACIDBASEDEF 13.3 (H) 10/07/2020 0535   O2SAT 39.0 10/07/2020 0535     Coagulation Profile: No results for input(s): INR, PROTIME in the last 168 hours.  Cardiac Enzymes: No results for input(s): CKTOTAL, CKMB, CKMBINDEX, TROPONINI in the last 168 hours.  HbA1C: Hgb A1c MFr Bld  Date/Time Value Ref Range Status  10/18/2019 12:13 AM 5.4 4.8 - 5.6 % Final    Comment:    (NOTE) Pre diabetes:          5.7%-6.4%  Diabetes:              >6.4%  Glycemic control for   <7.0% adults with diabetes     CBG: Recent Labs  Lab 10/06/20 1905  GLUCAP 115*    Review of Systems:   Unable to gather given critical   Past Medical History:  He,  has a past medical history of Anemia, Cervical radiculopathy, Chemical exposure, Chronic pain, CKD (chronic kidney disease), stage IV ([place]) (10/17/2019), COVID-19, Dyspnea, GERD (gastroesophageal reflux disease), GIB (gastrointestinal bleeding), Headache, Heart attack ([place]), History of CVA (cerebrovascular accident) (12/31/2019), History of gastric bypass (08/24/2019), History of stomach ulcers (05/19/2013), Hodgkin lymphoma, unspecified, unspecified site ([place]) (12/10/2012), Hypokalemia, Hypomagnesemia, PTSD (post-traumatic stress disorder), Renal disorder, Stroke ([place]), and Urinary retention.   Surgical History:   Past Surgical History:  Procedure Laterality Date   CHOLECYSTECTOMY     COLONOSCOPY     COLONOSCOPY WITH PROPOFOL N/A 04/18/2020   Procedure: COLONOSCOPY WITH PROPOFOL;  Surgeon: Doran Stabler, MD;  Location: [facility];  Service: Gastroenterology;  Laterality: N/A;   CYSTOSCOPY W/ URETERAL STENT PLACEMENT N/A 11/14/2019   Procedure: CYSTOSCOPY WITH RETROGRADE PYELOGRAM bilateral Wyvonnia Dusky STENT PLACEMENT left fulguration bladder . cystogram;  Surgeon: Raynelle Bring, MD;  Location: [facility];  Service: Urology;  Laterality: N/A;   CYSTOSCOPY W/ URETERAL STENT PLACEMENT Bilateral 12/25/2019   Procedure: CYSTOSCOPY  WITH bilateral  RETROGRADE PYELOGRAM/ leftURETERAL STENT PLACEMENT;  Surgeon: Lucas Mallow, MD;  Location: [facility];  Service: Urology;  Laterality: Bilateral;   CYSTOSCOPY W/ URETERAL STENT PLACEMENT Bilateral 07/06/2020   Procedure: CYSTOSCOPY WITH BILATERAL STENT EXCHANGE; BILATERAL PYELOGRAM RETROGRADE;  Surgeon: Raynelle Bring, MD;  Location: [facility];  Service: Urology;  Laterality: Bilateral;   CYSTOSCOPY WITH URETEROSCOPY AND STENT PLACEMENT N/A 04/18/2020   Procedure: CYSTOSCOPY WITH BILATERAL URETERAL STENT CHANGE PLACEMENT;  Surgeon: Ardis Hughs, MD;  Location: [facility];  Service: Urology;  Laterality: N/A;   EPIGASTRIC HERNIA REPAIR N/A 04/18/2020   Procedure: HERNIA REPAIR EPIGASTRIC ADULT;  Surgeon: Michael Boston, MD;  Location: [facility];  Service: General;  Laterality: N/A;   GASTRIC BYPASS     HERNIA REPAIR     IR FLUORO GUIDE CV LINE RIGHT  04/01/2020   IR NEPHRO TUBE REMOV/FL  04/25/2020   IR NEPHROSTOMY PLACEMENT LEFT  12/27/2019   IR NEPHROSTOMY PLACEMENT LEFT  04/01/2020   IR NEPHROSTOMY PLACEMENT RIGHT  11/16/2019   IR NEPHROSTOMY PLACEMENT RIGHT  12/27/2019   IR NEPHROSTOMY PLACEMENT RIGHT  04/01/2020   IR SINUS/FIST TUBE CHK-NON GI  12/03/2019   IR URETERAL STENT PLACEMENT EXISTING ACCESS RIGHT  12/08/2019   IR URETERAL STENT PLACEMENT EXISTING ACCESS RIGHT  01/26/2020   IR US GUIDE VASC ACCESS RIGHT  04/01/2020   LAPAROSCOPIC LOOP COLOSTOMY N/A 04/18/2020   Procedure: LAPAROSCOPIC SIGMOID COLOTOMY WITH COLOSTOMY;  Surgeon: Michael Boston, MD;  Location: [facility];  Service: General;  Laterality: N/A;     Social History:   reports that he has never smoked. He uses smokeless tobacco. He reports that he does not drink alcohol and does not use drugs.   Family History:  His family history includes Colon cancer in his maternal grandmother and paternal uncle; Heart disease in his father and paternal grandfather; Renal cancer in his father. There is no history of Stomach  cancer, Esophageal cancer, or Pancreatic cancer.   Allergies Allergies  Allergen Reactions   Nsaids Other (See Comments)    Bleeding GI Ulceration   Ciprofloxacin Nausea And Vomiting     Home Medications  Prior to Admission medications   Medication Sig Start Date End Date Taking? Authorizing Provider  acetaminophen (TYLENOL) 500 MG tablet Take 1 tablet (500 mg total) by mouth every 6 (six) hours as needed for mild pain, fever or headache. 05/01/20   Samella Parr, NP  calcium  carbonate (TUMS - DOSED IN MG ELEMENTAL CALCIUM) 500 MG chewable tablet Chew 1 tablet (200 mg of elemental calcium total) by mouth 3 (three) times daily. 05/01/20   Samella Parr, NP  cyanocobalamin (,VITAMIN B-12,) 1000 MCG/ML injection Inject 1,000 mcg into the muscle every 30 (thirty) days.  12/28/13   [provider]  docusate sodium (COLACE) 100 MG capsule Take 100 mg by mouth 2 (two) times daily as needed for mild constipation.    [provider]  DULoxetine (CYMBALTA) 30 MG capsule Take 1 capsule (30 mg total) by mouth 2 (two) times daily. 07/07/20 07/07/21  Raiford Noble Latif, DO  feeding supplement (ENSURE ENLIVE / ENSURE PLUS) LIQD Take 237 mLs by mouth 2 (two) times daily between meals. 07/07/20   Raiford Noble Latif, DO  fentaNYL (DURAGESIC) 25 MCG/HR Place 1 patch onto the skin every 3 (three) days. 07/08/20   Raiford Noble Latif, DO  folic acid (FOLVITE) 1 MG tablet Take 1 tablet (1 mg total) by mouth daily. 07/08/20   Raiford Noble Latif, DO  gabapentin (NEURONTIN) 100 MG capsule TAKE 2 CAPSULES (200 MG TOTAL) BY MOUTH DAILY. 05/03/20 05/03/21  Samella Parr, NP  gabapentin (NEURONTIN) 300 MG capsule TAKE 1 CAPSULE (300 MG TOTAL) BY MOUTH AT BEDTIME. 05/03/20 05/03/21  Samella Parr, NP  hydrOXYzine (ATARAX/VISTARIL) 10 MG tablet Take 1 tablet (10 mg total) by mouth 3 (three) times daily as needed for itching or anxiety. 07/07/20   Raiford Noble Latif, DO  Multiple Vitamin (MULTIVITAMIN WITH  MINERALS) TABS tablet Take 1 tablet by mouth daily. 05/03/20   Samella Parr, NP  Nutritional Supplements (,FEEDING SUPPLEMENT, PROSOURCE PLUS) liquid Take 30 mLs by mouth 2 (two) times daily between meals. 07/07/20   Raiford Noble Latif, DO  oxyCODONE-acetaminophen (PERCOCET/ROXICET) 5-325 MG tablet Take 1 tablet by mouth every 4 (four) hours as needed for severe pain.    [provider]  pantoprazole (PROTONIX) 20 MG tablet TAKE 1 TABLET (20 MG TOTAL) BY MOUTH 2 (TWO) TIMES DAILY. 05/03/20 05/03/21  Samella Parr, NP  polycarbophil (FIBERCON) 625 MG tablet Take 1 tablet (625 mg total) by mouth 2 (two) times daily. 05/01/20   Samella Parr, NP  polyethylene glycol (MIRALAX / GLYCOLAX) 17 g packet Take 17 g by mouth daily as needed for moderate constipation. 07/07/20   Raiford Noble Latif, DO  prazosin (MINIPRESS) 1 MG capsule Take 1 mg by mouth at bedtime.    [provider]  silodosin (RAPAFLO) 8 MG CAPS capsule Take 1 capsule (8 mg total) by mouth daily with breakfast. 07/07/20   Raiford Noble Latif, DO  sodium bicarbonate 650 MG tablet TAKE 1 TABLET (650 MG TOTAL) BY MOUTH 2 (TWO) TIMES DAILY. 05/03/20 05/03/21  Samella Parr, NP  Vitamin D3 (VITAMIN D) 25 MCG tablet TAKE 800 UNITS BY MOUTH DAILY. 05/03/20 05/03/21  Samella Parr, NP     Critical care time:    Performed by: Adonte Vanriper D. Harris  Total critical care time: 45 minutes  Critical care time was exclusive of separately billable procedures and treating other patients.  Critical care was necessary to treat or prevent imminent or life-threatening deterioration.  Critical care was time spent personally by me on the following activities: development of treatment plan with patient and/or surrogate as well as nursing, discussions with consultants, evaluation of patient's response to treatment, examination of patient, obtaining history from patient or surrogate, ordering and performing treatments and interventions, ordering and  review of laboratory studies, ordering and review of radiographic studies, pulse oximetry and re-evaluation of patient's condition.  Tejas Seawood D. Kenton Kingfisher, NP-C [facility] Personal contact information can be found on Amion  10/07/2020, 1:06 PM

## 2020-10-07 NOTE — Progress Notes (Signed)
PCCM Progress Note  Before placement of HD catheter patient was seen with significant bradycardia in the 30-40's but there was concern for functioning of bedside 5 leads. On completion of line placement order written to obtain 12 lead EKG, it confirms bradycardia with a rate of 39. Patient is completely asymptomatic, mentating well and remains slightly hypotensive.  Bradycardia is likely due to marked metabolic derangements and acute renal failure. Will supplement Ca, Mg, and K to optimize electrolytes. Would keep atropine at bedside as well and could consider Epi drip if needed.   Chase Nicholes D. Kenton Kingfisher, NP-C [facility] Personal contact information can be found on Amion  10/07/2020, 5:00 PM

## 2020-10-07 NOTE — ED Notes (Signed)
Report given to [place] with [facility] at this time. Report given to Donnetta Simpers, Rn at [facility] Room 10 at [facility] at this time

## 2020-10-07 NOTE — ED Notes (Signed)
Patient had folye cathter in place prior to arrival to ED.

## 2020-10-07 NOTE — Progress Notes (Addendum)
10/07/2020 11:49 AM  Pt was seen and examined.  CT findings reviewed.  Pt critically ill.  I spoke with Dr. Junious Silk with urology and spoke with Dr. Royce Macadamia with nephrology and spoke with Dr. Halford Chessman with critical care.  Pt is being transferred to [facility] for further care.  He has a bed on 2H at [facility]  Please see new orders.  I called and updated his son Chase [name].   Murvin Natal, MD How to contact the [facility] Attending or Consulting provider [place] or covering provider during after hours [place], for this patient?  Check the care team in [facility] and look for a) attending/consulting [facility] provider listed and b) the [facility] team listed Log into [email or web address] and use [place]'s universal password to access. If you do not have the password, please contact the hospital operator. Locate the [facility] provider you are looking for under [facility] and page to a number that you can be directly reached. If you still have difficulty reaching the provider, please page the [facility] (Director on Call) for the Hospitalists listed on amion for assistance.

## 2020-10-07 NOTE — ED Notes (Signed)
Patient's son Chase [name] updated at this time.

## 2020-10-07 NOTE — Progress Notes (Signed)
TRH night shift addendum to H&P.  CT abdomen/pelvis without contrast again showed hydronephrosis and possible urinary bladder outlet obstruction.  There was also partial visualization of possible pneumomediastinum.  A CT scan chest was recommended by radiology and has been ordered.  Tennis Must, MD.

## 2020-10-07 NOTE — Progress Notes (Addendum)
Pharmacy Antibiotic Note  Chase [name] is a 55 y.o. male admitted on 10/06/2020 with sepsis  Pharmacy has been consulted to change from Unasyn to Zosyn.  Plan: Zosyn 3.375g IV every 12 hours. Monitor labs, c/s, and patient improvement.  Height: 5\' 6"  (167.6 cm) Weight: 65.7 kg (144 lb 13.5 oz) IBW/kg (Calculated) : 63.8  Temp (24hrs), Avg:97.5 F (36.4 C), Min:96.9 F (36.1 C), Max:98.1 F (36.7 C)  Recent Labs  Lab 10/06/20 1746 10/06/20 1805 10/06/20 2312 10/07/20 0535  WBC  --  6.0  --  3.8*  CREATININE  --  11.49* 10.73* 10.24*  LATICACIDVEN 1.0  --   --   --     Estimated Creatinine Clearance: 7.3 mL/min (A) (by C-G formula based on SCr of 10.24 mg/dL (H)).    Allergies  Allergen Reactions   Nsaids Other (See Comments)    Bleeding GI Ulceration   Ciprofloxacin Nausea And Vomiting    Antimicrobials this admission: Zosyn 7/9 >> CTX 7/8     Microbiology results: 7/8 BCx: pending 7/8 UCx: pending    Thank you for allowing pharmacy to be a part of this patient's care.  Ramond Craver 10/07/2020 10:22 AM

## 2020-10-07 NOTE — H&P (Signed)
History and Physical    Chase [name] JJO:[id number] DOB: 1965/10/25 DOA: 10/06/2020  PCP: [name]   Patient coming from: Home.  I have personally briefly reviewed patient's old medical records in [place]  Chief Complaint: Altered mental status.   HPI: Chase [name] is a 55 y.o. male with medical history significant of anemia renal disease, cervical radiculopathy, history of unspecified chemical exposure, chronic pain syndrome due to chronic back pain, stage IV CKD, history of COVID-19, GERD, history of unspecified GIB, headache, CAD, history of MI, history of CVA, history of stomach ulcers, history of gastric bypass, Hodgkin's lymphoma, hypokalemia, hypomagnesemia, PTSD, BPH, history of bilateral hydronephrosis and bilateral ureteral stents placement, history of urinary retention who was brought to the emergency department via EMS after being found at home altered following his family requests for a welfare check since he failed to present to [facility] long hospital where he was supposed to have an urinary stent placed.  The patient is able to answer simple questions, but is unable to provide further information at this time.  ED Course: Initial vital signs were temperature 96.9 F, pulse 73, respirations 14, BP 127/61 mmHg O2 sat 100% on room air.  Nephrology was contacted.  They requested for the patient to be transferred to [facility], recommended sodium bicarbonate 100 mEq bolus followed by sodium bicarbonate infusion.  Lab work: His urinalysis was tolerated, with small hemoglobinuria, ketonuria 5 and proteinuria 100 mg/dL.  There was moderate leukocyte esterase with rare bacteria.  RBC and WBC were both more than 50 on hpf.  UDS was negative.  SARS coronavirus and influenza PCR was normal.  CBC showed a white count of 6.0, hemoglobin 9.3 g/dL platelets 233.  Lactic acid was 1.0.  CMP showed a sodium of 142, potassium 4.6, chloride 119 and CO2 less than 7 mmol/L.  Glucose 105,  BUN was 105 and creatinine 11.49 from a baseline around 26/4 mg/dL.  Imaging: A one-view portable chest radiograph did not show any acute cardiopulmonary pathology.  Review of Systems: As per HPI otherwise all other systems reviewed and are negative.  Past Medical History:  Diagnosis Date   Anemia    Cervical radiculopathy    with left sided weakness   Chemical exposure    service related injury   Chronic pain    CKD (chronic kidney disease), stage IV ([place]) 10/17/2019   COVID-19    Dyspnea    GERD (gastroesophageal reflux disease)    GIB (gastrointestinal bleeding)    Headache    Heart attack ([place])    History of CVA (cerebrovascular accident) 12/31/2019   History of gastric bypass 08/24/2019   History of stomach ulcers 05/19/2013   Formatting of this note might be different from the original. Attributed to chronic ibuprofen use   Hodgkin lymphoma, unspecified, unspecified site ([place]) 12/10/2012   Formatting of this note might be different from the original. In remission.   Hypokalemia    Hypomagnesemia    PTSD (post-traumatic stress disorder)    Renal disorder    CKD   Stroke [facility])    Urinary retention    due to bladder outlet obstruction   Past Surgical History:  Procedure Laterality Date   CHOLECYSTECTOMY     COLONOSCOPY     COLONOSCOPY WITH PROPOFOL N/A 04/18/2020   Procedure: COLONOSCOPY WITH PROPOFOL;  Surgeon: Doran Stabler, MD;  Location: [place];  Service: Gastroenterology;  Laterality: N/A;   CYSTOSCOPY W/ URETERAL STENT PLACEMENT N/A  11/14/2019   Procedure: CYSTOSCOPY WITH RETROGRADE PYELOGRAM bilateral Wyvonnia Dusky STENT PLACEMENT left fulguration bladder . cystogram;  Surgeon: Raynelle Bring, MD;  Location: [facility];  Service: Urology;  Laterality: N/A;   CYSTOSCOPY W/ URETERAL STENT PLACEMENT Bilateral 12/25/2019   Procedure: CYSTOSCOPY WITH bilateral  RETROGRADE PYELOGRAM/ leftURETERAL STENT PLACEMENT;  Surgeon: Lucas Mallow, MD;  Location: [facility];   Service: Urology;  Laterality: Bilateral;   CYSTOSCOPY W/ URETERAL STENT PLACEMENT Bilateral 07/06/2020   Procedure: CYSTOSCOPY WITH BILATERAL STENT EXCHANGE; BILATERAL PYELOGRAM RETROGRADE;  Surgeon: Raynelle Bring, MD;  Location: [facility];  Service: Urology;  Laterality: Bilateral;   CYSTOSCOPY WITH URETEROSCOPY AND STENT PLACEMENT N/A 04/18/2020   Procedure: CYSTOSCOPY WITH BILATERAL URETERAL STENT CHANGE PLACEMENT;  Surgeon: Ardis Hughs, MD;  Location: [place];  Service: Urology;  Laterality: N/A;   EPIGASTRIC HERNIA REPAIR N/A 04/18/2020   Procedure: HERNIA REPAIR EPIGASTRIC ADULT;  Surgeon: Michael Boston, MD;  Location: [place];  Service: General;  Laterality: N/A;   GASTRIC BYPASS     HERNIA REPAIR     IR FLUORO GUIDE CV LINE RIGHT  04/01/2020   IR NEPHRO TUBE REMOV/FL  04/25/2020   IR NEPHROSTOMY PLACEMENT LEFT  12/27/2019   IR NEPHROSTOMY PLACEMENT LEFT  04/01/2020   IR NEPHROSTOMY PLACEMENT RIGHT  11/16/2019   IR NEPHROSTOMY PLACEMENT RIGHT  12/27/2019   IR NEPHROSTOMY PLACEMENT RIGHT  04/01/2020   IR SINUS/FIST TUBE CHK-NON GI  12/03/2019   IR URETERAL STENT PLACEMENT EXISTING ACCESS RIGHT  12/08/2019   IR URETERAL STENT PLACEMENT EXISTING ACCESS RIGHT  01/26/2020   IR US GUIDE VASC ACCESS RIGHT  04/01/2020   LAPAROSCOPIC LOOP COLOSTOMY N/A 04/18/2020   Procedure: LAPAROSCOPIC SIGMOID COLOTOMY WITH COLOSTOMY;  Surgeon: Michael Boston, MD;  Location: [place];  Service: General;  Laterality: N/A;   Social History  reports that he has never smoked. He uses smokeless tobacco. He reports that he does not drink alcohol and does not use drugs.  Allergies  Allergen Reactions   Nsaids Other (See Comments)    Bleeding GI Ulceration   Ciprofloxacin Nausea And Vomiting   Family History  Problem Relation Age of Onset   Renal cancer Father        mets to liver and lungs and pancreas   Heart disease Father    Heart disease Paternal Grandfather    Colon cancer Maternal Grandmother     Colon cancer Paternal Uncle    Stomach cancer Neg Hx    Esophageal cancer Neg Hx    Pancreatic cancer Neg Hx    Prior to Admission medications   Medication Sig Start Date End Date Taking? Authorizing Provider  acetaminophen (TYLENOL) 500 MG tablet Take 1 tablet (500 mg total) by mouth every 6 (six) hours as needed for mild pain, fever or headache. 05/01/20   Samella Parr, NP  calcium carbonate (TUMS - DOSED IN MG ELEMENTAL CALCIUM) 500 MG chewable tablet Chew 1 tablet (200 mg of elemental calcium total) by mouth 3 (three) times daily. 05/01/20   Samella Parr, NP  cyanocobalamin (,VITAMIN B-12,) 1000 MCG/ML injection Inject 1,000 mcg into the muscle every 30 (thirty) days.  12/28/13   [provider]  docusate sodium (COLACE) 100 MG capsule Take 100 mg by mouth 2 (two) times daily as needed for mild constipation.    [provider]  DULoxetine (CYMBALTA) 30 MG capsule Take 1 capsule (30 mg total) by mouth 2 (two) times daily. 07/07/20 07/07/21  Alfredia Ferguson,  Bertram Savin, DO  feeding supplement (ENSURE ENLIVE / ENSURE PLUS) LIQD Take 237 mLs by mouth 2 (two) times daily between meals. 07/07/20   Raiford Noble Latif, DO  fentaNYL (DURAGESIC) 25 MCG/HR Place 1 patch onto the skin every 3 (three) days. 07/08/20   Raiford Noble Latif, DO  folic acid (FOLVITE) 1 MG tablet Take 1 tablet (1 mg total) by mouth daily. 07/08/20   Raiford Noble Latif, DO  gabapentin (NEURONTIN) 100 MG capsule TAKE 2 CAPSULES (200 MG TOTAL) BY MOUTH DAILY. 05/03/20 05/03/21  Samella Parr, NP  gabapentin (NEURONTIN) 300 MG capsule TAKE 1 CAPSULE (300 MG TOTAL) BY MOUTH AT BEDTIME. 05/03/20 05/03/21  Samella Parr, NP  hydrOXYzine (ATARAX/VISTARIL) 10 MG tablet Take 1 tablet (10 mg total) by mouth 3 (three) times daily as needed for itching or anxiety. 07/07/20   Raiford Noble Latif, DO  Multiple Vitamin (MULTIVITAMIN WITH MINERALS) TABS tablet Take 1 tablet by mouth daily. 05/03/20   Samella Parr, NP  Nutritional  Supplements (,FEEDING SUPPLEMENT, PROSOURCE PLUS) liquid Take 30 mLs by mouth 2 (two) times daily between meals. 07/07/20   Raiford Noble Latif, DO  oxyCODONE-acetaminophen (PERCOCET/ROXICET) 5-325 MG tablet Take 1 tablet by mouth every 4 (four) hours as needed for severe pain.    [provider]  pantoprazole (PROTONIX) 20 MG tablet TAKE 1 TABLET (20 MG TOTAL) BY MOUTH 2 (TWO) TIMES DAILY. 05/03/20 05/03/21  Samella Parr, NP  polycarbophil (FIBERCON) 625 MG tablet Take 1 tablet (625 mg total) by mouth 2 (two) times daily. 05/01/20   Samella Parr, NP  polyethylene glycol (MIRALAX / GLYCOLAX) 17 g packet Take 17 g by mouth daily as needed for moderate constipation. 07/07/20   Raiford Noble Latif, DO  prazosin (MINIPRESS) 1 MG capsule Take 1 mg by mouth at bedtime.    [provider]  silodosin (RAPAFLO) 8 MG CAPS capsule Take 1 capsule (8 mg total) by mouth daily with breakfast. 07/07/20   Raiford Noble Latif, DO  sodium bicarbonate 650 MG tablet TAKE 1 TABLET (650 MG TOTAL) BY MOUTH 2 (TWO) TIMES DAILY. 05/03/20 05/03/21  Samella Parr, NP  Vitamin D3 (VITAMIN D) 25 MCG tablet TAKE 800 UNITS BY MOUTH DAILY. 05/03/20 05/03/21  Samella Parr, NP   Physical Exam: Vitals:   10/06/20 1934 10/06/20 2000 10/06/20 2030 10/06/20 2300  BP: 129/72 134/70 133/67 128/62  Pulse: 62 69 67 (!) 57  Resp: 11 12 11 14   Temp:    98.1 F (36.7 C)  TempSrc:    Oral  SpO2: 100% 100% 100% 100%  Weight:      Height:       Constitutional: Looks emaciated and chronically ill-appearing, but currently in NAD. Eyes: PERRL, lids and conjunctivae normal.  Sclera mildly injected. ENMT: Mucous membranes and lips are dry.  Posterior pharynx clear of any exudate or lesions. Neck: normal, supple, no masses, no thyromegaly Respiratory: clear to auscultation bilaterally, no wheezing, no crackles. Normal respiratory effort. No accessory muscle use.  Cardiovascular: Regular rate and rhythm, no murmurs / rubs /  gallops. No extremity edema. 2+ pedal pulses. No carotid bruits.  Abdomen: No distention.  Left sided colostomy in place.  Bowel sounds positive.  Soft, no tenderness, no masses palpated. No hepatosplenomegaly. Musculoskeletal: no clubbing / cyanosis.  Good ROM, no contractures. Normal muscle tone.  Skin: no rashes, lesions, ulcers on very limited dermatological examination. Neurologic: CN 2-12 grossly intact. Sensation intact, DTR normal.  Left-sided  hemiparesis.  Unable to fully evaluate. Psychiatric: Somnolent.  Wakes up with verbal stimuli.  Oriented x 1, partially oriented to place, time and situation.   Labs on Admission: I have personally reviewed following labs and imaging studies  CBC: Recent Labs  Lab 10/06/20 1805  WBC 6.0  NEUTROABS 4.7  HGB 9.3*  HCT 29.0*  MCV 97.3  PLT 443   Basic Metabolic Panel: Recent Labs  Lab 10/06/20 1805 10/06/20 2312  NA 142 143  K 4.6 4.1  CL 119* 118*  CO2 <7* 7*  GLUCOSE 117* 121*  BUN 105* 104*  CREATININE 11.49* 10.73*  CALCIUM 7.5* 7.1*   GFR: Estimated Creatinine Clearance: 6.9 mL/min (A) (by C-G formula based on SCr of 10.73 mg/dL (H)).  Liver Function Tests: Recent Labs  Lab 10/06/20 1805  AST 14*  ALT 54*  ALKPHOS 129*  BILITOT 0.9  PROT 7.6  ALBUMIN 3.4*   Urine analysis:    Component Value Date/Time   COLORURINE YELLOW 10/06/2020 1931   APPEARANCEUR TURBID (A) 10/06/2020 1931   LABSPEC 1.014 10/06/2020 1931   PHURINE 6.0 10/06/2020 1931   GLUCOSEU NEGATIVE 10/06/2020 1931   HGBUR SMALL (A) 10/06/2020 1931   BILIRUBINUR NEGATIVE 10/06/2020 1931   KETONESUR 5 (A) 10/06/2020 1931   PROTEINUR 100 (A) 10/06/2020 1931   NITRITE NEGATIVE 10/06/2020 1931   LEUKOCYTESUR MODERATE (A) 10/06/2020 1931   Radiological Exams on Admission: DG Chest Portable 1 View  Result Date: 10/06/2020 CLINICAL DATA:  Altered mental status. EXAM: PORTABLE CHEST 1 VIEW COMPARISON:  Single-view of the chest 06/19/2020. FINDINGS: The  patient is rotated on the study. Lungs clear. Heart size normal. No pneumothorax or pleural fluid. No acute or focal bony abnormality. There is partial visualization of a double-J stent in the right kidney. Surgical clips right upper quadrant noted. IMPRESSION: No acute disease. Electronically Signed   By: Inge Rise M.D.   On: 10/06/2020 19:04    06/28/2020 echocardiogram w/out IEA  IMPRESSIONS:   1. Left ventricular ejection fraction, by estimation, is 60 to 65%. The  left ventricle has normal function. The left ventricle has no regional  wall motion abnormalities. Left ventricular diastolic parameters were  normal.   2. Right ventricular systolic function is normal. The right ventricular  size is normal. There is normal pulmonary artery systolic pressure.   3. Left atrial size was mildly dilated.   4. The mitral valve is normal in structure. No evidence of mitral valve  regurgitation. No evidence of mitral stenosis.   5. The aortic valve is normal in structure. Aortic valve regurgitation is  not visualized. No aortic stenosis is present.   6. The inferior vena cava is normal in size with greater than 50%  respiratory variability, suggesting right atrial pressure of 3 mmHg.   EKG: Independently reviewed. Vent. rate 68 BPM PR interval 144 ms QRS duration 129 ms QT/QTcB 426/454 ms P-R-T axes 95 101 59 Sinus rhythm Nonspecific intraventricular conduction delay ST elevation, consider inferior injury Artifact in lead(s) I II III aVR aVL aVF V1 V2 V3 V4 V5 V6  Assessment/Plan Principal Problem:   Acute metabolic encephalopathy In the setting of   Acute renal failure superimposed on stage 4 chronic kidney disease (HCC) Admit/transfer to progressive unit at [facility]/inpatient. Continue sodium bicarbonate infusion. Monitor renal function electrolytes. Monitor intake and output. Check CT abdomen/pelvis without contrast. Notify nephrology of patient's arrival to accepting  facility. Given his current frail state his prognosis is guarded.  Active Problems:  Anemia in chronic kidney disease Monitor H&H. Transfuse as needed. Erythropoietin administration per nephrology.    BPH (benign prostatic hyperplasia) With a history of bilateral hydronephrosis/B/L stent placements. Monitor intake and output. Consult urology once he is at [facility].    Postlaminectomy syndrome, not elsewhere classified Will defer opioid analgesics at this time given his current MS.    Anxiety and depression   PTSD (post-traumatic stress disorder) Holding duloxetine and hydroxyzine until MS improves. Consult behavioral health once the patient is more alert.       DVT prophylaxis: SQ heparin. Code Status:   DNR. Family Communication:  I spoke to Jane Broughton (Son) at [phone]. Disposition Plan:   Patient is from:  Home.  Anticipated DC to:  TBD.  Anticipated DC date:  10/10/2020.  Anticipated DC barriers: Clinical status and consultant sign off.  Consults called:  Nephrology contacted by ED and will see [facility]. Admission status:  Inpatient/progressive unit.  Severity of Illness:  High severity after presenting with altered mental status in the setting of acute on stage IV chronic CKD with severe metabolic acidosis secondary to acidemia.  The patient will be transferred to [facility] for higher level of care and nephrology services when a bed is made available.  Reubin Milan MD [facility]  How to contact the [facility] Attending or Consulting provider [place] or covering provider during after hours [place], for this patient?   Check the care team in [facility] and look for a) attending/consulting [facility] provider listed and b) the [facility] team listed Log into [email or web address] and use [place]'s universal password to access. If you do not have the password, please contact the hospital operator. Locate the [facility] provider you are looking for under [facility] and page to a  number that you can be directly reached. If you still have difficulty reaching the provider, please page the [facility] (Director on Call) for the Hospitalists listed on amion for assistance.  10/07/2020, 12:45 AM   This document was prepared in Dragon voice recognition software and may contain some unintended transcription errors.

## 2020-10-07 NOTE — ED Provider Notes (Signed)
Received call from radiologist regarding patient's abnormal ct scans. Patient has been admitted to hospitalist. I have not seen the patient and thus have no medical relationship to the patient, info. relayed to Dr. Olevia Bowens via secure chat.    Emer Onnen, Corene Cornea, MD 10/07/20 [phone]

## 2020-10-07 NOTE — Consult Note (Addendum)
Consultation: Bilateral hydronephrosis, acute kidney injury Requested by: Dr. Irwin Brakeman  History of Present Illness: [name] is a 55 year old male.  He is followed by Dr. Alinda Money for bilateral ureteral obstruction/hydronephrosis with bilateral indwelling ureteral stents.  He has also had urinary retention has a Foley catheter.  Long-term urologic plan is for TURP, voiding trial and possibly stent removal.  Patient was scheduled for 10/05/2020, did show up for covid test on 09/29/2020, but did not show up for pre-op labs 10/04/2020 and his procedure on 10/05/2020. Urine cx from office May 2022 grew mixed growth. Last stent change was 07/06/2020 with bilateral 6 x 26 cm stents.   He was found at home on a welfare check with low blood pressure and brought to emergency. His Cr was up to 11.49  from his baseline around 4 (h/o CKD stage 5 that progressed to acute renal failure march of this year and required CRRT). CT revealed bilateral stents in good position but with bilateral hydronephrosis despite the stents. Foley in place and bladder drained. Also, pneumomediastinum.   Nephrology is considering dialysis.  He remains bradycardic and hypotensive.   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 Attributed to chronic ibuprofen use   Hodgkin lymphoma, unspecified, unspecified site ([place]) 12/10/2012   Formatting of this note might be different from the original. In remission.   Hypokalemia     Hypomagnesemia    PTSD (post-traumatic stress disorder)    Renal disorder    CKD   Stroke [facility])    Urinary retention    due to bladder outlet obstruction   Past Surgical History:  Procedure Laterality Date   CHOLECYSTECTOMY     COLONOSCOPY     COLONOSCOPY WITH PROPOFOL N/A 04/18/2020   Procedure: COLONOSCOPY WITH PROPOFOL;  Surgeon: Doran Stabler, MD;  Location: [place];  Service: Gastroenterology;  Laterality: N/A;   CYSTOSCOPY W/ URETERAL STENT PLACEMENT N/A 11/14/2019   Procedure: CYSTOSCOPY WITH RETROGRADE PYELOGRAM bilateral Wyvonnia Dusky STENT PLACEMENT left fulguration bladder . cystogram;  Surgeon: Raynelle Bring, MD;  Location: [facility];  Service: Urology;  Laterality: N/A;   CYSTOSCOPY W/ URETERAL STENT PLACEMENT Bilateral 12/25/2019   Procedure: CYSTOSCOPY WITH bilateral  RETROGRADE PYELOGRAM/ leftURETERAL STENT PLACEMENT;  Surgeon: Lucas Mallow, MD;  Location: [facility];  Service: Urology;  Laterality: Bilateral;   CYSTOSCOPY W/ URETERAL STENT PLACEMENT Bilateral 07/06/2020   Procedure: CYSTOSCOPY WITH BILATERAL STENT EXCHANGE; BILATERAL PYELOGRAM RETROGRADE;  Surgeon: Raynelle Bring, MD;  Location: [facility];  Service: Urology;  Laterality: Bilateral;   CYSTOSCOPY WITH URETEROSCOPY AND STENT PLACEMENT N/A 04/18/2020   Procedure: CYSTOSCOPY WITH BILATERAL URETERAL STENT CHANGE PLACEMENT;  Surgeon: Ardis Hughs, MD;  Location: [place];  Service: Urology;  Laterality: N/A;   EPIGASTRIC HERNIA REPAIR N/A 04/18/2020   Procedure: HERNIA REPAIR EPIGASTRIC ADULT;  Surgeon: Michael Boston, MD;  Location: [place];  Service: General;  Laterality: N/A;   GASTRIC BYPASS     HERNIA REPAIR     IR FLUORO GUIDE CV LINE RIGHT  04/01/2020   IR NEPHRO TUBE REMOV/FL  04/25/2020   IR NEPHROSTOMY PLACEMENT LEFT  12/27/2019   IR NEPHROSTOMY PLACEMENT LEFT  04/01/2020   IR NEPHROSTOMY PLACEMENT RIGHT  11/16/2019   IR NEPHROSTOMY PLACEMENT RIGHT  12/27/2019   IR NEPHROSTOMY PLACEMENT RIGHT   04/01/2020   IR SINUS/FIST TUBE CHK-NON GI  12/03/2019   IR URETERAL STENT PLACEMENT EXISTING ACCESS RIGHT  12/08/2019   IR URETERAL STENT PLACEMENT EXISTING ACCESS RIGHT  01/26/2020   IR US GUIDE VASC ACCESS RIGHT  04/01/2020   LAPAROSCOPIC LOOP COLOSTOMY N/A 04/18/2020   Procedure: LAPAROSCOPIC SIGMOID COLOTOMY WITH COLOSTOMY;  Surgeon: Michael Boston, MD;  Location: [place];  Service: General;  Laterality: N/A;    Home Medications:  Medications Prior to Admission  Medication Sig Dispense Refill Last Dose   acetaminophen (TYLENOL) 500 MG tablet Take 1 tablet (500 mg total) by mouth every 6 (six) hours as needed for mild pain, fever or headache. 30 tablet 0    calcium carbonate (TUMS - DOSED IN MG ELEMENTAL CALCIUM) 500 MG chewable tablet Chew 1 tablet (200 mg of elemental calcium total) by mouth 3 (three) times daily.      cyanocobalamin (,VITAMIN B-12,) 1000 MCG/ML injection Inject 1,000 mcg into the muscle every 30 (thirty) days.       docusate sodium (COLACE) 100 MG capsule Take 100 mg by mouth 2 (two) times daily as needed for mild constipation.      DULoxetine (CYMBALTA) 30 MG capsule Take 1 capsule (30 mg total) by mouth 2 (two) times daily. 60 capsule 0    feeding supplement (ENSURE ENLIVE / ENSURE PLUS) LIQD Take 237 mLs by mouth 2 (two) times daily between meals. 237 mL 12    fentaNYL (DURAGESIC) 25 MCG/HR Place 1 patch onto the skin every 3 (three) days. 5 patch 0    folic acid (FOLVITE) 1 MG tablet Take 1 tablet (1 mg total) by mouth daily. 30 tablet 0    gabapentin (NEURONTIN) 100 MG capsule TAKE 2 CAPSULES (200 MG TOTAL) BY MOUTH DAILY. 60 capsule 0    gabapentin (NEURONTIN) 300 MG capsule TAKE 1 CAPSULE (300 MG TOTAL) BY MOUTH AT BEDTIME. 30 capsule 3    hydrOXYzine (ATARAX/VISTARIL) 10 MG tablet Take 1 tablet (10 mg total) by mouth 3 (three) times daily as needed for itching or anxiety. 30 tablet 0    Multiple Vitamin (MULTIVITAMIN WITH MINERALS) TABS tablet Take 1 tablet by  mouth daily. 30 tablet 0    Nutritional Supplements (,FEEDING SUPPLEMENT, PROSOURCE PLUS) liquid Take 30 mLs by mouth 2 (two) times daily between meals. 887 mL 0    oxyCODONE-acetaminophen (PERCOCET/ROXICET) 5-325 MG tablet Take 1 tablet by mouth every 4 (four) hours as needed for severe pain.      pantoprazole (PROTONIX) 20 MG tablet TAKE 1 TABLET (20 MG TOTAL) BY MOUTH 2 (TWO) TIMES DAILY. 15 tablet 0    polycarbophil (FIBERCON) 625 MG tablet Take 1 tablet (625 mg total) by mouth 2 (two) times daily. 60 tablet 3    polyethylene glycol (MIRALAX / GLYCOLAX) 17 g packet Take 17 g by mouth daily as needed for moderate constipation. 14 each 0    prazosin (MINIPRESS) 1 MG capsule Take 1 mg by mouth at bedtime.      silodosin (RAPAFLO) 8 MG CAPS capsule Take 1 capsule (8 mg  total) by mouth daily with breakfast. 30 capsule 0    sodium bicarbonate 650 MG tablet TAKE 1 TABLET (650 MG TOTAL) BY MOUTH 2 (TWO) TIMES DAILY. 30 tablet 0    Vitamin D3 (VITAMIN D) 25 MCG tablet TAKE 800 UNITS BY MOUTH DAILY. 75 tablet 0    Allergies:  Allergies  Allergen Reactions   Nsaids Other (See Comments)    Bleeding GI Ulceration   Ciprofloxacin Nausea And Vomiting    Family History  Problem Relation Age of Onset   Renal cancer Father        mets to liver and lungs and pancreas   Heart disease Father    Heart disease Paternal Grandfather    Colon cancer Maternal Grandmother    Colon cancer Paternal Uncle    Stomach cancer Neg Hx    Esophageal cancer Neg Hx    Pancreatic cancer Neg Hx    Social History:  reports that he has never smoked. He uses smokeless tobacco. He reports that he does not drink alcohol and does not use drugs.  ROS: A complete review of systems was performed.  All systems are negative except for pertinent findings as noted. Review of Systems  Unable to perform ROS: Acuity of condition (mental)    Physical Exam:  Vital signs in last 24 hours: Temp:  [96.9 F (36.1 C)-98.1 F (36.7  C)] 98.1 F (36.7 C) (07/08 2300) Pulse Rate:  [40-73] 42 (07/09 1130) Resp:  [11-20] 14 (07/09 0930) BP: (86-134)/(46-98) 96/49 (07/09 1130) SpO2:  [97 %-100 %] 100 % (07/09 1130) Weight:  [65.7 kg] 65.7 kg (07/08 1711) General:  Alert and oriented, but weak and cachectic HEENT: Normocephalic, atraumatic Cardiovascular: Regular rate and rhythm Lungs: Regular rate and effort Abdomen: Soft, nontender, nondistended, no abdominal masses Back: No CVA tenderness Extremities: No edema Neurologic: Grossly intact GU: Penis and scrotum appeared normal.  16 French Foley catheter in place.  Urine clear in bag.  Laboratory Data:  Results for orders placed or performed during the hospital encounter of 10/06/20 (from the past 24 hour(s))  Lactic acid, plasma     Status: None   Collection Time: 10/06/20  5:46 PM  Result Value Ref Range   Lactic Acid, Venous 1.0 0.5 - 1.9 mmol/L  Culture, blood (routine x 2)     Status: None (Preliminary result)   Collection Time: 10/06/20  5:47 PM   Specimen: BLOOD RIGHT FOREARM  Result Value Ref Range   Specimen Description BLOOD RIGHT FOREARM    Special Requests      Blood Culture results may not be optimal due to an inadequate volume of blood received in culture bottles BOTTLES DRAWN AEROBIC AND ANAEROBIC   Culture      NO GROWTH < 24 HOURS Performed at [facility], [address], [place], [place] [address]    Report Status PENDING   CBC with Differential     Status: Abnormal   Collection Time: 10/06/20  6:05 PM  Result Value Ref Range   WBC 6.0 4.0 - 10.5 K/uL   RBC 2.98 (L) 4.22 - 5.81 MIL/uL   Hemoglobin 9.3 (L) 13.0 - 17.0 g/dL   HCT 29.0 (L) 39.0 - 52.0 %   MCV 97.3 80.0 - 100.0 fL   MCH 31.2 26.0 - 34.0 pg   MCHC 32.1 30.0 - 36.0 g/dL   RDW 16.2 (H) 11.5 - 15.5 %   Platelets 233 150 - 400 K/uL   nRBC 0.3 (H) 0.0 -  0.2 %   Neutrophils Relative % 79 %   Neutro Abs 4.7 1.7 - 7.7 K/uL   Lymphocytes Relative 8 %   Lymphs Abs 0.5 (L) 0.7 -  4.0 K/uL   Monocytes Relative 12 %   Monocytes Absolute 0.8 0.1 - 1.0 K/uL   Eosinophils Relative 0 %   Eosinophils Absolute 0.0 0.0 - 0.5 K/uL   Basophils Relative 0 %   Basophils Absolute 0.0 0.0 - 0.1 K/uL   Immature Granulocytes 1 %   Abs Immature Granulocytes 0.03 0.00 - 0.07 K/uL  Comprehensive metabolic panel     Status: Abnormal   Collection Time: 10/06/20  6:05 PM  Result Value Ref Range   Sodium 142 135 - 145 mmol/L   Potassium 4.6 3.5 - 5.1 mmol/L   Chloride 119 (H) 98 - 111 mmol/L   CO2 <7 (L) 22 - 32 mmol/L   Glucose, Bld 117 (H) 70 - 99 mg/dL   BUN 105 (H) 6 - 20 mg/dL   Creatinine, Ser 11.49 (H) 0.61 - 1.24 mg/dL   Calcium 7.5 (L) 8.9 - 10.3 mg/dL   Total Protein 7.6 6.5 - 8.1 g/dL   Albumin 3.4 (L) 3.5 - 5.0 g/dL   AST 14 (L) 15 - 41 U/L   ALT 54 (H) 0 - 44 U/L   Alkaline Phosphatase 129 (H) 38 - 126 U/L   Total Bilirubin 0.9 0.3 - 1.2 mg/dL   GFR, Estimated 5 (L) >60 mL/min   Anion gap NOT CALCULATED 5 - 15  Culture, blood (routine x 2)     Status: None (Preliminary result)   Collection Time: 10/06/20  6:06 PM   Specimen: BLOOD RIGHT ARM  Result Value Ref Range   Specimen Description BLOOD RIGHT ARM    Special Requests      BOTTLES DRAWN AEROBIC AND ANAEROBIC Blood Culture adequate volume   Culture      NO GROWTH < 24 HOURS Performed at [facility], [address] [place], [place] [address]    Report Status PENDING   Blood gas, arterial (at [facility])     Status: Abnormal   Collection Time: 10/06/20  6:30 PM  Result Value Ref Range   FIO2 21.00    pH, Arterial 7.037 (LL) 7.350 - 7.450   pCO2 arterial 12.9 (LL) 32.0 - 48.0 mmHg   pO2, Arterial 126 (H) 83.0 - 108.0 mmHg   Bicarbonate 6.1 (L) 20.0 - 28.0 mmol/L   Acid-base deficit 25.7 (H) 0.0 - 2.0 mmol/L   O2 Saturation 97.5 %   Patient temperature 36.0    Allens test (pass/fail) BRACHIAL ARTERY (A) PASS  CBG monitoring, ED     Status: Abnormal   Collection Time: 10/06/20  7:05 PM  Result Value Ref  Range   Glucose-Capillary 115 (H) 70 - 99 mg/dL  Urinalysis, Routine w reflex microscopic Urine, Catheterized     Status: Abnormal   Collection Time: 10/06/20  7:31 PM  Result Value Ref Range   Color, Urine YELLOW YELLOW   APPearance TURBID (A) CLEAR   Specific Gravity, Urine 1.014 1.005 - 1.030   pH 6.0 5.0 - 8.0   Glucose, UA NEGATIVE NEGATIVE mg/dL   Hgb urine dipstick SMALL (A) NEGATIVE   Bilirubin Urine NEGATIVE NEGATIVE   Ketones, ur 5 (A) NEGATIVE mg/dL   Protein, ur 100 (A) NEGATIVE mg/dL   Nitrite NEGATIVE NEGATIVE   Leukocytes,Ua MODERATE (A) NEGATIVE   RBC / HPF >50 (H) 0 - 5  RBC/hpf   WBC, UA >50 (H) 0 - 5 WBC/hpf   Bacteria, UA RARE (A) NONE SEEN   WBC Clumps PRESENT   Rapid urine drug screen (hospital performed)     Status: None   Collection Time: 10/06/20  7:31 PM  Result Value Ref Range   Opiates NONE DETECTED NONE DETECTED   Cocaine NONE DETECTED NONE DETECTED   Benzodiazepines NONE DETECTED NONE DETECTED   Amphetamines NONE DETECTED NONE DETECTED   Tetrahydrocannabinol NONE DETECTED NONE DETECTED   Barbiturates NONE DETECTED NONE DETECTED  Resp Panel by RT-PCR (Flu A&B, Covid) Nasopharyngeal Swab     Status: None   Collection Time: 10/06/20  8:10 PM   Specimen: Nasopharyngeal Swab; Nasopharyngeal(NP) swabs in vial transport medium  Result Value Ref Range   SARS Coronavirus 2 by RT PCR NEGATIVE NEGATIVE   Influenza A by PCR NEGATIVE NEGATIVE   Influenza B by PCR NEGATIVE NEGATIVE  Basic metabolic panel     Status: Abnormal   Collection Time: 10/06/20 11:12 PM  Result Value Ref Range   Sodium 143 135 - 145 mmol/L   Potassium 4.1 3.5 - 5.1 mmol/L   Chloride 118 (H) 98 - 111 mmol/L   CO2 7 (L) 22 - 32 mmol/L   Glucose, Bld 121 (H) 70 - 99 mg/dL   BUN 104 (H) 6 - 20 mg/dL   Creatinine, Ser 10.73 (H) 0.61 - 1.24 mg/dL   Calcium 7.1 (L) 8.9 - 10.3 mg/dL   GFR, Estimated 5 (L) >60 mL/min   Anion gap 18 (H) 5 - 15  Procalcitonin - Baseline     Status: None    Collection Time: 10/06/20 11:12 PM  Result Value Ref Range   Procalcitonin 0.17 ng/mL  Blood gas, venous     Status: Abnormal   Collection Time: 10/06/20 11:12 PM  Result Value Ref Range   FIO2 21.00    pH, Ven 7.083 (LL) 7.250 - 7.430   pCO2, Ven 23.5 (L) 44.0 - 60.0 mmHg   pO2, Ven 35.6 32.0 - 45.0 mmHg   Bicarbonate 8.2 (L) 20.0 - 28.0 mmol/L   Acid-base deficit 21.3 (H) 0.0 - 2.0 mmol/L   O2 Saturation 56.5 %   Patient temperature 37.0   Procalcitonin     Status: None   Collection Time: 10/07/20  5:35 AM  Result Value Ref Range   Procalcitonin 0.17 ng/mL  Comprehensive metabolic panel     Status: Abnormal   Collection Time: 10/07/20  5:35 AM  Result Value Ref Range   Sodium 145 135 - 145 mmol/L   Potassium 2.9 (L) 3.5 - 5.1 mmol/L   Chloride 114 (H) 98 - 111 mmol/L   CO2 13 (L) 22 - 32 mmol/L   Glucose, Bld 113 (H) 70 - 99 mg/dL   BUN 98 (H) 6 - 20 mg/dL   Creatinine, Ser 10.24 (H) 0.61 - 1.24 mg/dL   Calcium 7.0 (L) 8.9 - 10.3 mg/dL   Total Protein 5.8 (L) 6.5 - 8.1 g/dL   Albumin 2.7 (L) 3.5 - 5.0 g/dL   AST 16 15 - 41 U/L   ALT 40 0 - 44 U/L   Alkaline Phosphatase 94 38 - 126 U/L   Total Bilirubin 1.1 0.3 - 1.2 mg/dL   GFR, Estimated 5 (L) >60 mL/min   Anion gap 18 (H) 5 - 15  CBC     Status: Abnormal   Collection Time: 10/07/20  5:35 AM  Result Value Ref Range   WBC  3.8 (L) 4.0 - 10.5 K/uL   RBC 2.31 (L) 4.22 - 5.81 MIL/uL   Hemoglobin 7.5 (L) 13.0 - 17.0 g/dL   HCT 21.7 (L) 39.0 - 52.0 %   MCV 93.9 80.0 - 100.0 fL   MCH 32.5 26.0 - 34.0 pg   MCHC 34.6 30.0 - 36.0 g/dL   RDW 15.9 (H) 11.5 - 15.5 %   Platelets 202 150 - 400 K/uL   nRBC 0.5 (H) 0.0 - 0.2 %  Blood gas, venous     Status: Abnormal   Collection Time: 10/07/20  5:35 AM  Result Value Ref Range   FIO2 21.00    pH, Ven 7.244 (L) 7.250 - 7.430   pCO2, Ven 30.2 (L) 44.0 - 60.0 mmHg   pO2, Ven <31.0 (LL) 32.0 - 45.0 mmHg   Bicarbonate 13.3 (L) 20.0 - 28.0 mmol/L   Acid-base deficit 13.3 (H) 0.0 -  2.0 mmol/L   O2 Saturation 39.0 %   Patient temperature 62.8   Basic metabolic panel     Status: Abnormal   Collection Time: 10/07/20 11:00 AM  Result Value Ref Range   Sodium 143 135 - 145 mmol/L   Potassium 2.9 (L) 3.5 - 5.1 mmol/L   Chloride 114 (H) 98 - 111 mmol/L   CO2 19 (L) 22 - 32 mmol/L   Glucose, Bld 119 (H) 70 - 99 mg/dL   BUN 96 (H) 6 - 20 mg/dL   Creatinine, Ser 9.70 (H) 0.61 - 1.24 mg/dL   Calcium 6.6 (L) 8.9 - 10.3 mg/dL   GFR, Estimated 6 (L) >60 mL/min   Anion gap 10 5 - 15   Recent Results (from the past 240 hour(s))  SARS CORONAVIRUS 2 (TAT 6-24 HRS) Nasopharyngeal Nasopharyngeal Swab     Status: None   Collection Time: 10/03/20 10:20 AM   Specimen: Nasopharyngeal Swab  Result Value Ref Range Status   SARS Coronavirus 2 NEGATIVE NEGATIVE Final    Comment: (NOTE) SARS-CoV-2 target nucleic acids are NOT DETECTED.  The SARS-CoV-2 RNA is generally detectable in upper and lower respiratory specimens during the acute phase of infection. Negative results do not preclude SARS-CoV-2 infection, do not rule out co-infections with other pathogens, and should not be used as the sole basis for treatment or other patient management decisions. Negative results must be combined with clinical observations, patient history, and epidemiological information. The expected result is Negative.  Fact Sheet for Patients: SugarRoll.be  Fact Sheet for Healthcare Providers: https://www.woods-mathews.com/  This test is not yet approved or cleared by the [place] FDA and  has been authorized for detection and/or diagnosis of SARS-CoV-2 by FDA under an Emergency Use Authorization (EUA). This EUA will remain  in effect (meaning this test can be used) for the duration of the COVID-19 declaration under Se ction 564(b)(1) of the Act, 21 U.S.C. section 360bbb-3(b)(1), unless the authorization is terminated or revoked sooner.  Performed at  [facility], [place] [address] [place], [place] [address]   Culture, blood (routine x 2)     Status: None (Preliminary result)   Collection Time: 10/06/20  5:47 PM   Specimen: BLOOD RIGHT FOREARM  Result Value Ref Range Status   Specimen Description BLOOD RIGHT FOREARM  Final   Special Requests   Final    Blood Culture results may not be optimal due to an inadequate volume of blood received in culture bottles BOTTLES DRAWN AEROBIC AND ANAEROBIC   Culture   Final  NO GROWTH < 24 HOURS Performed at [facility], [address] [place], [place] [address]    Report Status PENDING  Incomplete  Culture, blood (routine x 2)     Status: None (Preliminary result)   Collection Time: 10/06/20  6:06 PM   Specimen: BLOOD RIGHT ARM  Result Value Ref Range Status   Specimen Description BLOOD RIGHT ARM  Final   Special Requests   Final    BOTTLES DRAWN AEROBIC AND ANAEROBIC Blood Culture adequate volume   Culture   Final    NO GROWTH < 24 HOURS Performed at [facility], [address], [place], [place] [address]    Report Status PENDING  Incomplete  Resp Panel by RT-PCR (Flu A&B, Covid) Nasopharyngeal Swab     Status: None   Collection Time: 10/06/20  8:10 PM   Specimen: Nasopharyngeal Swab; Nasopharyngeal(NP) swabs in vial transport medium  Result Value Ref Range Status   SARS Coronavirus 2 by RT PCR NEGATIVE NEGATIVE Final    Comment: (NOTE) SARS-CoV-2 target nucleic acids are NOT DETECTED.  The SARS-CoV-2 RNA is generally detectable in upper respiratory specimens during the acute phase of infection. The lowest concentration of SARS-CoV-2 viral copies this assay can detect is 138 copies/mL. A negative result does not preclude SARS-Cov-2 infection and should not be used as the sole basis for treatment or other patient management decisions. A negative result may occur with  improper specimen collection/handling, submission of specimen other than nasopharyngeal swab, presence of viral  mutation(s) within the areas targeted by this assay, and inadequate number of viral copies(<138 copies/mL). A negative result must be combined with clinical observations, patient history, and epidemiological information. The expected result is Negative.  Fact Sheet for Patients:  EntrepreneurPulse.com.au  Fact Sheet for Healthcare Providers:  IncredibleEmployment.be  This test is no t yet approved or cleared by the [place] FDA and  has been authorized for detection and/or diagnosis of SARS-CoV-2 by FDA under an Emergency Use Authorization (EUA). This EUA will remain  in effect (meaning this test can be used) for the duration of the COVID-19 declaration under Section 564(b)(1) of the Act, 21 U.S.C.section 360bbb-3(b)(1), unless the authorization is terminated  or revoked sooner.       Influenza A by PCR NEGATIVE NEGATIVE Final   Influenza B by PCR NEGATIVE NEGATIVE Final    Comment: (NOTE) The Xpert Xpress SARS-CoV-2/FLU/RSV plus assay is intended as an aid in the diagnosis of influenza from Nasopharyngeal swab specimens and should not be used as a sole basis for treatment. Nasal washings and aspirates are unacceptable for Xpert Xpress SARS-CoV-2/FLU/RSV testing.  Fact Sheet for Patients: EntrepreneurPulse.com.au  Fact Sheet for Healthcare Providers: IncredibleEmployment.be  This test is not yet approved or cleared by the [place] FDA and has been authorized for detection and/or diagnosis of SARS-CoV-2 by FDA under an Emergency Use Authorization (EUA). This EUA will remain in effect (meaning this test can be used) for the duration of the COVID-19 declaration under Section 564(b)(1) of the Act, 21 U.S.C. section 360bbb-3(b)(1), unless the authorization is terminated or revoked.  Performed at [facility], [address] [place], [place] [address]    Creatinine: Recent Labs     10/06/20 1805 10/06/20 2312 10/07/20 0535 10/07/20 1100  CREATININE 11.49* 10.73* 10.24* 9.70*    Impression/Assessment/plan:  1) Bilateral hydronephrosis despite stents with ARF- after discussing with Dr. Halford Chessman and NP Kenton Kingfisher and my own assessment of the patient, he is too frail for general anesthesia  and stent change also with pneumomediastinum worry about ventilator for stent change. I would recommend obtaining goals of care and if patient and patient, family wish to continue to pursue care consult interventional radiology for bilateral nephrostomy tube placement. Nephrology considering CRRT.   2) BPH with Urinary retention, bilateral hydronephrosis with chronic Foley and stent - Dr. Alinda Money was planning TURP and stent change, but as above patient too frail for any procedure or general anesthetic right now.  If patient is resuscitated and gained strength and undergoes nephrostomy tubes, maybe at some point the stents could be changed and then nephrostomy tubes capped and/or removed. I will have nurses exchange the foley.    I will notify Dr. Alinda Money of admission.   Festus Aloe 10/07/2020, 1:11 PM

## 2020-10-07 NOTE — ED Notes (Signed)
Patient transported to CT 

## 2020-10-07 NOTE — Consult Note (Signed)
[facility] Renal Consultation Note  Requesting MD:  Indication for Consultation:  aki, metabolic acidosis   Chief complaint: ams  HPI: Chase [name] is a 55 y.o. 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  He was supposed to have a TURP late this week and didn't show up for procedure or pre-op labs.  He was then found to be unresponsive on a welfare check.  He has an indwelling foley.  Nephrology consulted.  Recommended transfer to [facility] for nephrology and urologic care.  I last saw him in 01/02/20 inpatient and he was lost to follow-up with [facility] after that hospitalization after missing multiple appointments.  He did also not establish care with nephrology at the [facility].  He has had interim hospitalizations including one with CRRT.  At a SNF at one point but most recently at home.  Blood pressure has been low range 90's and HR has been 40's most recently from 50's earlier.  He has been on bicarb gtt.   I had an extensive conversation with the patient, his son (present via phone), and his nurse.  We discussed the risks/benefits/indications of dialysis/RRT.  They do wish to pursue dialysis and consent for renal replacement therapy.  The patient wishes it hadn't come to this but is not ready to transition to comfort care.  He wants to live independently but his son discussed that that hadn't been going well.   Creatinine, Ser  Date/Time Value Ref Range Status  10/07/2020 11:00 AM 9.70 (H) 0.61 - 1.24 mg/dL Final  10/07/2020 05:35 AM 10.24 (H) 0.61 - 1.24 mg/dL Final  10/06/2020 11:12 PM 10.73 (H) 0.61 - 1.24 mg/dL Final  10/06/2020 06:05 PM 11.49 (H) 0.61 - 1.24 mg/dL Final  07/07/2020 06:05 AM 4.07 (H) 0.61 - 1.24 mg/dL Final  07/07/2020 06:05 AM 3.96 (H) 0.61 - 1.24 mg/dL Final  07/06/2020 04:56 AM 4.01 (H) 0.61 - 1.24 mg/dL Final  07/06/2020 04:56 AM 3.98 (H) 0.61 - 1.24  mg/dL Final  07/05/2020 05:01 AM 3.85 (H) 0.61 - 1.24 mg/dL Final  07/04/2020 11:42 AM 3.69 (H) 0.61 - 1.24 mg/dL Final  07/03/2020 05:00 AM 3.20 (H) 0.61 - 1.24 mg/dL Final  07/02/2020 05:00 AM 2.89 (H) 0.61 - 1.24 mg/dL Final  07/01/2020 08:34 PM 2.53 (H) 0.61 - 1.24 mg/dL Final  07/01/2020 04:30 AM 2.01 (H) 0.61 - 1.24 mg/dL Final  06/30/2020 04:15 PM 1.33 (H) 0.61 - 1.24 mg/dL Final  06/30/2020 04:30 AM 1.42 (H) 0.61 - 1.24 mg/dL Final  06/29/2020 03:50 PM 1.66 (H) 0.61 - 1.24 mg/dL Final  06/29/2020 05:00 AM 2.17 (H) 0.61 - 1.24 mg/dL Final  06/28/2020 03:17 PM 3.54 (H) 0.61 - 1.24 mg/dL Final    Comment:    DELTA CHECK NOTED  06/28/2020 05:35 AM 5.48 (H) 0.61 - 1.24 mg/dL Final    Comment:    DELTA CHECK NOTED RESULTS VERIFIED VIA RECOLLECT   06/27/2020 06:30 PM 10.16 (H) 0.61 - 1.24 mg/dL Final  06/27/2020 12:37 PM 10.66 (H) 0.61 - 1.24 mg/dL Final  04/26/2020 12:22 AM 4.42 (H) 0.61 - 1.24 mg/dL Final  04/25/2020 03:08 AM 4.70 (H) 0.61 - 1.24 mg/dL Final  04/24/2020 02:28 AM 5.06 (H) 0.61 - 1.24 mg/dL Final  04/23/2020 01:44 AM 4.57 (H) 0.61 - 1.24 mg/dL Final  04/22/2020 12:33 AM 4.46 (H) 0.61 - 1.24 mg/dL Final  04/21/2020 06:28 AM 4.31 (H) 0.61 -  1.24 mg/dL Final  04/19/2020 12:57 AM 4.64 (H) 0.61 - 1.24 mg/dL Final  04/18/2020 12:05 PM 4.80 (H) 0.61 - 1.24 mg/dL Final  04/17/2020 03:07 AM 4.97 (H) 0.61 - 1.24 mg/dL Final  04/12/2020 03:35 AM 5.15 (H) 0.61 - 1.24 mg/dL Final  04/11/2020 03:34 AM 5.08 (H) 0.61 - 1.24 mg/dL Final  04/10/2020 03:21 AM 5.34 (H) 0.61 - 1.24 mg/dL Final  04/09/2020 02:50 AM 5.09 (H) 0.61 - 1.24 mg/dL Final  04/08/2020 04:22 AM 4.99 (H) 0.61 - 1.24 mg/dL Final  04/07/2020 01:15 AM 4.86 (H) 0.61 - 1.24 mg/dL Final  04/06/2020 03:52 AM 4.74 (H) 0.61 - 1.24 mg/dL Final  04/05/2020 01:58 AM 4.43 (H) 0.61 - 1.24 mg/dL Final  04/04/2020 04:47 AM 4.22 (H) 0.61 - 1.24 mg/dL Final    Comment:    DELTA CHECK NOTED  04/03/2020 04:01 AM 8.39 (H) 0.61  - 1.24 mg/dL Final  04/02/2020 03:12 AM 7.58 (H) 0.61 - 1.24 mg/dL Final    Comment:    DELTA CHECK NOTED  04/01/2020 08:41 AM 15.34 (H) 0.61 - 1.24 mg/dL Final  04/01/2020 02:13 AM 15.49 (H) 0.61 - 1.24 mg/dL Final  03/31/2020 02:20 PM 16.69 (H) 0.61 - 1.24 mg/dL Final  01/26/2020 10:50 AM 5.76 (H) 0.61 - 1.24 mg/dL Final  01/08/2020 05:18 AM 5.62 (H) 0.61 - 1.24 mg/dL Final  01/07/2020 04:49 AM 5.99 (H) 0.61 - 1.24 mg/dL Final  01/06/2020 05:07 AM 6.19 (H) 0.61 - 1.24 mg/dL Final  01/05/2020 05:58 AM 5.94 (H) 0.61 - 1.24 mg/dL Final  01/04/2020 04:53 AM 5.74 (H) 0.61 - 1.24 mg/dL Final  01/03/2020 05:06 AM 6.42 (H) 0.61 - 1.24 mg/dL Final     PMHx:   Past Medical History:  Diagnosis Date   Anemia    Cervical radiculopathy    with left sided weakness   Chemical exposure    service related injury   Chronic pain    CKD (chronic kidney disease), stage IV ([place]) 10/17/2019   COVID-19    Dyspnea    GERD (gastroesophageal reflux disease)    GIB (gastrointestinal bleeding)    Headache    Heart attack ([place])    History of CVA (cerebrovascular accident) 12/31/2019   History of gastric bypass 08/24/2019   History of stomach ulcers 05/19/2013   Formatting of this note might be different from the original. Attributed to chronic ibuprofen use   Hodgkin lymphoma, unspecified, unspecified site ([place]) 12/10/2012   Formatting of this note might be different from the original. In remission.   Hypokalemia    Hypomagnesemia    PTSD (post-traumatic stress disorder)    Renal disorder    CKD   Stroke [facility])    Urinary retention    due to bladder outlet obstruction    Past Surgical History:  Procedure Laterality Date   CHOLECYSTECTOMY     COLONOSCOPY     COLONOSCOPY WITH PROPOFOL N/A 04/18/2020   Procedure: COLONOSCOPY WITH PROPOFOL;  Surgeon: Doran Stabler, MD;  Location: [place];  Service: Gastroenterology;  Laterality: N/A;   CYSTOSCOPY W/ URETERAL STENT PLACEMENT N/A  11/14/2019   Procedure: CYSTOSCOPY WITH RETROGRADE PYELOGRAM bilateral Wyvonnia Dusky STENT PLACEMENT left fulguration bladder . cystogram;  Surgeon: Raynelle Bring, MD;  Location: [facility];  Service: Urology;  Laterality: N/A;   CYSTOSCOPY W/ URETERAL STENT PLACEMENT Bilateral 12/25/2019   Procedure: CYSTOSCOPY WITH bilateral  RETROGRADE PYELOGRAM/ leftURETERAL STENT PLACEMENT;  Surgeon: Lucas Mallow, MD;  Location: [facility];  Service: Urology;  Laterality: Bilateral;   CYSTOSCOPY W/ URETERAL STENT PLACEMENT Bilateral 07/06/2020   Procedure: CYSTOSCOPY WITH BILATERAL STENT EXCHANGE; BILATERAL PYELOGRAM RETROGRADE;  Surgeon: Raynelle Bring, MD;  Location: [facility];  Service: Urology;  Laterality: Bilateral;   CYSTOSCOPY WITH URETEROSCOPY AND STENT PLACEMENT N/A 04/18/2020   Procedure: CYSTOSCOPY WITH BILATERAL URETERAL STENT CHANGE PLACEMENT;  Surgeon: Ardis Hughs, MD;  Location: [place];  Service: Urology;  Laterality: N/A;   EPIGASTRIC HERNIA REPAIR N/A 04/18/2020   Procedure: HERNIA REPAIR EPIGASTRIC ADULT;  Surgeon: Michael Boston, MD;  Location: [place];  Service: General;  Laterality: N/A;   GASTRIC BYPASS     HERNIA REPAIR     IR FLUORO GUIDE CV LINE RIGHT  04/01/2020   IR NEPHRO TUBE REMOV/FL  04/25/2020   IR NEPHROSTOMY PLACEMENT LEFT  12/27/2019   IR NEPHROSTOMY PLACEMENT LEFT  04/01/2020   IR NEPHROSTOMY PLACEMENT RIGHT  11/16/2019   IR NEPHROSTOMY PLACEMENT RIGHT  12/27/2019   IR NEPHROSTOMY PLACEMENT RIGHT  04/01/2020   IR SINUS/FIST TUBE CHK-NON GI  12/03/2019   IR URETERAL STENT PLACEMENT EXISTING ACCESS RIGHT  12/08/2019   IR URETERAL STENT PLACEMENT EXISTING ACCESS RIGHT  01/26/2020   IR US GUIDE VASC ACCESS RIGHT  04/01/2020   LAPAROSCOPIC LOOP COLOSTOMY N/A 04/18/2020   Procedure: LAPAROSCOPIC SIGMOID COLOTOMY WITH COLOSTOMY;  Surgeon: Michael Boston, MD;  Location: [place];  Service: General;  Laterality: N/A;    Family Hx:  Family History  Problem Relation Age of Onset    Renal cancer Father        mets to liver and lungs and pancreas   Heart disease Father    Heart disease Paternal Grandfather    Colon cancer Maternal Grandmother    Colon cancer Paternal Uncle    Stomach cancer Neg Hx    Esophageal cancer Neg Hx    Pancreatic cancer Neg Hx     Social History:  reports that he has never smoked. He uses smokeless tobacco. He reports that he does not drink alcohol and does not use drugs.  Allergies:  Allergies  Allergen Reactions   Nsaids Other (See Comments)    Bleeding GI Ulceration   Ciprofloxacin Nausea And Vomiting    Medications: Prior to Admission medications   Medication Sig Start Date End Date Taking? Authorizing Provider  acetaminophen (TYLENOL) 500 MG tablet Take 1 tablet (500 mg total) by mouth every 6 (six) hours as needed for mild pain, fever or headache. 05/01/20   Samella Parr, NP  calcium carbonate (TUMS - DOSED IN MG ELEMENTAL CALCIUM) 500 MG chewable tablet Chew 1 tablet (200 mg of elemental calcium total) by mouth 3 (three) times daily. 05/01/20   Samella Parr, NP  cyanocobalamin (,VITAMIN B-12,) 1000 MCG/ML injection Inject 1,000 mcg into the muscle every 30 (thirty) days.  12/28/13   [provider]  docusate sodium (COLACE) 100 MG capsule Take 100 mg by mouth 2 (two) times daily as needed for mild constipation.    [provider]  DULoxetine (CYMBALTA) 30 MG capsule Take 1 capsule (30 mg total) by mouth 2 (two) times daily. 07/07/20 07/07/21  Raiford Noble Latif, DO  feeding supplement (ENSURE ENLIVE / ENSURE PLUS) LIQD Take 237 mLs by mouth 2 (two) times daily between meals. 07/07/20   Raiford Noble Latif, DO  fentaNYL (DURAGESIC) 25 MCG/HR Place 1 patch onto the skin every 3 (three) days. 07/08/20   Raiford Noble Latif, DO  folic acid (FOLVITE) 1 MG tablet Take  1 tablet (1 mg total) by mouth daily. 07/08/20   Raiford Noble Latif, DO  gabapentin (NEURONTIN) 100 MG capsule TAKE 2 CAPSULES (200 MG TOTAL) BY MOUTH DAILY.  05/03/20 05/03/21  Samella Parr, NP  gabapentin (NEURONTIN) 300 MG capsule TAKE 1 CAPSULE (300 MG TOTAL) BY MOUTH AT BEDTIME. 05/03/20 05/03/21  Samella Parr, NP  hydrOXYzine (ATARAX/VISTARIL) 10 MG tablet Take 1 tablet (10 mg total) by mouth 3 (three) times daily as needed for itching or anxiety. 07/07/20   Raiford Noble Latif, DO  Multiple Vitamin (MULTIVITAMIN WITH MINERALS) TABS tablet Take 1 tablet by mouth daily. 05/03/20   Samella Parr, NP  Nutritional Supplements (,FEEDING SUPPLEMENT, PROSOURCE PLUS) liquid Take 30 mLs by mouth 2 (two) times daily between meals. 07/07/20   Raiford Noble Latif, DO  oxyCODONE-acetaminophen (PERCOCET/ROXICET) 5-325 MG tablet Take 1 tablet by mouth every 4 (four) hours as needed for severe pain.    [provider]  pantoprazole (PROTONIX) 20 MG tablet TAKE 1 TABLET (20 MG TOTAL) BY MOUTH 2 (TWO) TIMES DAILY. 05/03/20 05/03/21  Samella Parr, NP  polycarbophil (FIBERCON) 625 MG tablet Take 1 tablet (625 mg total) by mouth 2 (two) times daily. 05/01/20   Samella Parr, NP  polyethylene glycol (MIRALAX / GLYCOLAX) 17 g packet Take 17 g by mouth daily as needed for moderate constipation. 07/07/20   Raiford Noble Latif, DO  prazosin (MINIPRESS) 1 MG capsule Take 1 mg by mouth at bedtime.    [provider]  silodosin (RAPAFLO) 8 MG CAPS capsule Take 1 capsule (8 mg total) by mouth daily with breakfast. 07/07/20   Raiford Noble Latif, DO  sodium bicarbonate 650 MG tablet TAKE 1 TABLET (650 MG TOTAL) BY MOUTH 2 (TWO) TIMES DAILY. 05/03/20 05/03/21  Samella Parr, NP  Vitamin D3 (VITAMIN D) 25 MCG tablet TAKE 800 UNITS BY MOUTH DAILY. 05/03/20 05/03/21  Samella Parr, NP    I have reviewed the patient's current  and reported prior to admission medications.  Labs:  BMP Latest Ref Rng & Units 10/07/2020 10/07/2020 10/06/2020  Glucose 70 - 99 mg/dL 119(H) 113(H) 121(H)  BUN 6 - 20 mg/dL 96(H) 98(H) 104(H)  Creatinine 0.61 - 1.24 mg/dL 9.70(H) 10.24(H) 10.73(H)   Sodium 135 - 145 mmol/L 143 145 143  Potassium 3.5 - 5.1 mmol/L 2.9(L) 2.9(L) 4.1  Chloride 98 - 111 mmol/L 114(H) 114(H) 118(H)  CO2 22 - 32 mmol/L 19(L) 13(L) 7(L)  Calcium 8.9 - 10.3 mg/dL 6.6(L) 7.0(L) 7.1(L)    Urinalysis    Component Value Date/Time   COLORURINE YELLOW 10/06/2020 1931   APPEARANCEUR TURBID (A) 10/06/2020 1931   LABSPEC 1.014 10/06/2020 1931   PHURINE 6.0 10/06/2020 1931   GLUCOSEU NEGATIVE 10/06/2020 1931   HGBUR SMALL (A) 10/06/2020 1931   BILIRUBINUR NEGATIVE 10/06/2020 1931   KETONESUR 5 (A) 10/06/2020 1931   PROTEINUR 100 (A) 10/06/2020 1931   NITRITE NEGATIVE 10/06/2020 1931   LEUKOCYTESUR MODERATE (A) 10/06/2020 1931     ROS:  Pertinent items noted in HPI and remainder of comprehensive ROS otherwise negative.    Physical Exam: Vitals:   10/07/20 1130 10/07/20 1300  BP: (!) 96/49   Pulse: (!) 42 (!) 38  Resp:  (!) 0  Temp:    SpO2: 100%      General: thin appearing gentleman  HR 40-50's on my exam HEENT: [place] AT Eyes: EOMI but keeps eyes closed most of the time Neck: supple trachea midline Heart:  bradycardic, S1S2 no rub Lungs: clear to auscultation  Abdomen: soft/NT/ND Extremities: no edema  Skin: no rash on extremities exposed Neuro: awake keeps eyes closed; oriented to person Psych - frustrated understandably so GU foley in place  Assessment/Plan:  # AKI on CKD stage V - hx of chronic obstruction and chronic indwelling foley - This event will likely lead to dialysis dependence/ESRD  - Start CRRT.  No UF   - Has contacted critical care for line placement   # Metabolic acidosis  - setting of sepsis and AKI on CKD  - has been on bicarb gtt - stop once on CRRT  # Sepsis  - abx per primary team   # Chronic urinary retention  - continue foley catheter  - missed scheduled TURP per charting - would consult urology - Dr. Wynetta Emery spoke with urology prior to transfer  # Hypokalemia - replete; repeat electrolyte panel ordered  for 4pm   # Encephalopathy/AMS - setting of AKI as well as acidosis  # Hx CVA - left hemiparesis - previously at SNF and noted he presented from home most recently  # Anemia - normocytic.  Would transfuse for goal of 7.  His son did consent for transfusion of blood products if needed  Claudia Desanctis 10/07/2020, 2:31 PM

## 2020-10-07 NOTE — Progress Notes (Signed)
D/w Dr. Royce Macadamia.  She had lengthy discussion with patient.  He is agreeable to undergo CRRT.  Will place HD catheter and consult urology.  Chesley Mires, MD [place] Pager - [phone] 10/07/2020, 2:22 PM

## 2020-10-07 NOTE — Procedures (Signed)
Central Venous Catheter Insertion Procedure Note  Chase [name]  [id number]  Jul 31, 1965  Date:10/07/20  Time:4:15 PM   Provider Performing:Taimur Fier D. Harris   Procedure: Insertion of Non-tunneled Central Venous Catheter(36556)with US guidance (24462)      Indication(s) Hemodialysis  Consent Risks of the procedure as well as the alternatives and risks of each were explained to the patient and/or caregiver.  Consent for the procedure was obtained and is signed in the bedside chart  Anesthesia Topical only with 1% lidocaine   Timeout Verified patient identification, verified procedure, site/side was marked, verified correct patient position, special equipment/implants available, medications/allergies/relevant history reviewed, required imaging and test results available.  Sterile Technique Maximal sterile technique including full sterile barrier drape, hand hygiene, sterile gown, sterile gloves, mask, hair covering, sterile ultrasound probe cover (if used).  Procedure Description Area of catheter insertion was cleaned with chlorhexidine and draped in sterile fashion.   With real-time ultrasound guidance a HD catheter was placed into the left internal jugular vein.  Nonpulsatile blood flow and easy flushing noted in all ports.  The catheter was sutured in place and sterile dressing applied.  Complications/Tolerance None; patient tolerated the procedure well. Chest X-ray is ordered to verify placement for internal jugular or subclavian cannulation.  Chest x-ray is not ordered for femoral cannulation.  EBL Minimal  Specimen(s) None  Kyle Stansell D. Kenton Kingfisher, NP-C [facility] Personal contact information can be found on Amion  10/07/2020, 4:16 PM

## 2020-10-07 NOTE — Progress Notes (Signed)
Informed of hypocalcemia. Cal 6.4. corrected for albumin around 7.7. already receiving calcium gluconate. Continue to monitor and replete cal prn. Can monitor ionized calcium as well  Gean Quint, MD [facility]

## 2020-10-07 NOTE — Progress Notes (Signed)
PHARMACY NOTE:  ANTIMICROBIAL RENAL DOSAGE ADJUSTMENT  Current antimicrobial regimen includes a mismatch between antimicrobial dosage and estimated renal function.  As per policy approved by the Pharmacy & Therapeutics and Medical Executive Committees, the antimicrobial dosage will be adjusted accordingly.  Current antimicrobial dosage:  pip/tazo 3.375g q12 hr (EI)  Indication: Urosepsis   Renal Function:  Estimated Creatinine Clearance: 7.7 mL/min (A) (by C-G formula based on SCr of 9.7 mg/dL (H)). []      On intermittent HD, scheduled: [x]      On CRRT- starting 7/9     Antimicrobial dosage has been changed to:  q8 hr (EI)   Thank you for allowing pharmacy to be a part of this patient's care.  Benetta Spar, PharmD, BCPS, BCCP Clinical Pharmacist  Please check AMION for all [place] phone numbers After 10:00 PM, call [place] [phone]

## 2020-10-08 ENCOUNTER — Encounter (HOSPITAL_COMMUNITY): Payer: Self-pay | Admitting: Family Medicine

## 2020-10-08 ENCOUNTER — Inpatient Hospital Stay (HOSPITAL_COMMUNITY): Payer: No Typology Code available for payment source

## 2020-10-08 DIAGNOSIS — R9431 Abnormal electrocardiogram [ECG] [EKG]: Secondary | ICD-10-CM | POA: Diagnosis not present

## 2020-10-08 DIAGNOSIS — G9341 Metabolic encephalopathy: Secondary | ICD-10-CM | POA: Diagnosis not present

## 2020-10-08 HISTORY — PX: IR NEPHROSTOMY PLACEMENT RIGHT: IMG6064

## 2020-10-08 HISTORY — PX: IR US GUIDANCE: IMG2393

## 2020-10-08 HISTORY — PX: IR NEPHROSTOMY PLACEMENT LEFT: IMG6063

## 2020-10-08 LAB — ECHOCARDIOGRAM LIMITED
Height: 66 in
Weight: 1908.3 oz

## 2020-10-08 LAB — RENAL FUNCTION PANEL
Albumin: 2.3 g/dL — ABNORMAL LOW (ref 3.5–5.0)
Albumin: 2.3 g/dL — ABNORMAL LOW (ref 3.5–5.0)
Anion gap: 6 (ref 5–15)
Anion gap: 8 (ref 5–15)
BUN: 43 mg/dL — ABNORMAL HIGH (ref 6–20)
BUN: 29 mg/dL — ABNORMAL HIGH (ref 6–20)
CO2: 25 mmol/L (ref 22–32)
CO2: 23 mmol/L (ref 22–32)
Calcium: 7.1 mg/dL — ABNORMAL LOW (ref 8.9–10.3)
Calcium: 6.7 mg/dL — ABNORMAL LOW (ref 8.9–10.3)
Chloride: 108 mmol/L (ref 98–111)
Chloride: 106 mmol/L (ref 98–111)
Creatinine, Ser: 4.14 mg/dL — ABNORMAL HIGH (ref 0.61–1.24)
Creatinine, Ser: 3.09 mg/dL — ABNORMAL HIGH (ref 0.61–1.24)
GFR, Estimated: 16 mL/min — ABNORMAL LOW (ref >60)
GFR, Estimated: 23 mL/min — ABNORMAL LOW (ref >60)
Glucose, Bld: 112 mg/dL — ABNORMAL HIGH (ref 70–99)
Glucose, Bld: 110 mg/dL — ABNORMAL HIGH (ref 70–99)
Phosphorus: 2.4 mg/dL — ABNORMAL LOW (ref 2.5–4.6)
Phosphorus: 3.4 mg/dL (ref 2.5–4.6)
Potassium: 3.2 mmol/L — ABNORMAL LOW (ref 3.5–5.1)
Potassium: 3.7 mmol/L (ref 3.5–5.1)
Sodium: 139 mmol/L (ref 135–145)
Sodium: 137 mmol/L (ref 135–145)

## 2020-10-08 LAB — CBC
HCT: 20.9 % — ABNORMAL LOW (ref 39.0–52.0)
HCT: 26.3 % — ABNORMAL LOW (ref 39.0–52.0)
Hemoglobin: 7.2 g/dL — ABNORMAL LOW (ref 13.0–17.0)
Hemoglobin: 9.2 g/dL — ABNORMAL LOW (ref 13.0–17.0)
MCH: 31.2 pg (ref 26.0–34.0)
MCH: 31.9 pg (ref 26.0–34.0)
MCHC: 34.4 g/dL (ref 30.0–36.0)
MCHC: 35 g/dL (ref 30.0–36.0)
MCV: 90.5 fL (ref 80.0–100.0)
MCV: 91.3 fL (ref 80.0–100.0)
Platelets: 174 10*3/uL (ref 150–400)
Platelets: 162 10*3/uL (ref 150–400)
RBC: 2.31 MIL/uL — ABNORMAL LOW (ref 4.22–5.81)
RBC: 2.88 MIL/uL — ABNORMAL LOW (ref 4.22–5.81)
RDW: 15.4 % (ref 11.5–15.5)
RDW: 15.2 % (ref 11.5–15.5)
WBC: 4.3 10*3/uL (ref 4.0–10.5)
WBC: 4.1 10*3/uL (ref 4.0–10.5)
nRBC: 0 % (ref 0.0–0.2)
nRBC: 0 % (ref 0.0–0.2)

## 2020-10-08 LAB — PROTIME-INR
INR: 1.1 (ref 0.8–1.2)
Prothrombin Time: 14.7 seconds (ref 11.4–15.2)

## 2020-10-08 LAB — URINE CULTURE

## 2020-10-08 LAB — PROCALCITONIN: Procalcitonin: 0.13 ng/mL

## 2020-10-08 LAB — PREPARE RBC (CROSSMATCH)

## 2020-10-08 LAB — TSH: TSH: 1.718 u[IU]/mL (ref 0.350–4.500)

## 2020-10-08 LAB — MAGNESIUM: Magnesium: 2.4 mg/dL (ref 1.7–2.4)

## 2020-10-08 MED ORDER — FENTANYL CITRATE (PF) 100 MCG/2ML IJ SOLN
INTRAMUSCULAR | Status: AC | PRN
  Administered 2020-10-08: 50 ug via INTRAVENOUS

## 2020-10-08 MED ORDER — MIDAZOLAM HCL 2 MG/2ML IJ SOLN
INTRAMUSCULAR | Status: AC
  Filled 2020-10-08: qty 2

## 2020-10-08 MED ORDER — SODIUM CHLORIDE 0.9% FLUSH
5.0000 mL | Freq: Three times a day (TID) | INTRAVENOUS | Status: DC
  Administered 2020-10-08 – 2020-10-23 (×27): 5 mL

## 2020-10-08 MED ORDER — SODIUM CHLORIDE 0.9% IV SOLUTION: Freq: Once | INTRAVENOUS | Status: AC

## 2020-10-08 MED ORDER — IOHEXOL 300 MG/ML  SOLN
50.0000 mL | Freq: Once | INTRAMUSCULAR | Status: AC | PRN
  Administered 2020-10-08: 10 mL

## 2020-10-08 MED ORDER — POTASSIUM PHOSPHATES 15 MMOLE/5ML IV SOLN
20.0000 mmol | Freq: Once | INTRAVENOUS | Status: AC
  Administered 2020-10-08: 20 mmol via INTRAVENOUS
  Filled 2020-10-08: qty 6.67

## 2020-10-08 MED ORDER — LIDOCAINE HCL (PF) 1 % IJ SOLN
INTRAMUSCULAR | Status: AC
  Administered 2020-10-08: 5 mL
  Filled 2020-10-08: qty 5

## 2020-10-08 MED ORDER — SODIUM CHLORIDE 0.9 % IV SOLN
INTRAVENOUS | Status: AC | PRN
  Administered 2020-10-08: 2 g via INTRAVENOUS

## 2020-10-08 MED ORDER — FENTANYL CITRATE (PF) 100 MCG/2ML IJ SOLN
INTRAMUSCULAR | Status: AC
  Filled 2020-10-08: qty 2

## 2020-10-08 MED ORDER — CALCIUM GLUCONATE-NACL 1-0.675 GM/50ML-% IV SOLN
1.0000 g | Freq: Once | INTRAVENOUS | Status: AC
  Administered 2020-10-08: 1000 mg via INTRAVENOUS
  Filled 2020-10-08: qty 50

## 2020-10-08 MED ORDER — LIDOCAINE HCL (PF) 1 % IJ SOLN: 5.0000 mL | Freq: Once | INTRAMUSCULAR | Status: AC

## 2020-10-08 MED ORDER — LIDOCAINE HCL 1 % IJ SOLN
INTRAMUSCULAR | Status: AC | PRN
  Administered 2020-10-08: 10 mL

## 2020-10-08 MED ORDER — MIDAZOLAM HCL 2 MG/2ML IJ SOLN
INTRAMUSCULAR | Status: AC | PRN
  Administered 2020-10-08 (×2): 1 mg via INTRAVENOUS

## 2020-10-08 MED ORDER — SODIUM CHLORIDE 0.9 % IV SOLN
INTRAVENOUS | Status: AC
  Filled 2020-10-08: qty 20

## 2020-10-08 MED ORDER — POTASSIUM PHOSPHATES 15 MMOLE/5ML IV SOLN
10.0000 mmol | Freq: Once | INTRAVENOUS | Status: AC
  Administered 2020-10-08: 10 mmol via INTRAVENOUS
  Filled 2020-10-08 (×2): qty 3.33

## 2020-10-08 MED ORDER — LIDOCAINE HCL 1 % IJ SOLN
INTRAMUSCULAR | Status: AC
  Filled 2020-10-08: qty 20

## 2020-10-08 NOTE — Progress Notes (Addendum)
[facility] Progress Note  Name: Chase [name] MRN: [id number] DOB: 1965-11-25  Chief Complaint:  AMS  Subjective:  Chase [name] had 745 mL uop over 7/9.  Seen and examined on CRRT.  103/58 and HR 58.  Chase [name] was started on CRRT on 7/9 after nontunneled catheter with critical care.  No fluid removed with CRRT.   Review of systems:  Denies shortness of breath Chase [name] is cold - rearranged blankets Denies n/v ------------- Background on referral: Chase [name] is a 55 y.o. 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  Chase [name] was supposed to have a TURP late this week and didn't show up for procedure or pre-op labs.  Chase [name] was then found to be unresponsive on a welfare check.  Chase [name] has an indwelling foley.  Nephrology consulted.  Recommended transfer to [facility] for nephrology and urologic care.  I last saw him in 01/02/20 inpatient and Chase [name] was lost to follow-up with [facility] after that hospitalization after missing multiple appointments.  Chase [name] did also not establish care with nephrology at the [facility].  Chase [name] has had interim hospitalizations including one with CRRT.  At a SNF at one point but most recently at home.  Blood pressure has been low range 90's and HR has been 40's most recently from 50's earlier.  Chase [name] has been on bicarb gtt.   I had an extensive conversation with the patient, his son (present via phone), and his nurse.  We discussed the risks/benefits/indications of dialysis/RRT.  They do wish to pursue dialysis and consent for renal replacement therapy.  The patient wishes it hadn't come to this but is not ready to transition to comfort care.  Chase [name] wants to live independently but his son discussed that that hadn't been going well.   Intake/Output Summary (Last 24 hours) at 10/08/2020 0622 Last data filed at 10/08/2020 0600 Gross per 24 hour  Intake 2063.06 ml  Output 729 ml  Net 1334.06 ml    Vitals:   Vitals:   10/08/20 0515 10/08/20 0530 10/08/20 0545 10/08/20 0600  BP: 117/68 106/61 108/65 108/64  Pulse: (!) 52 (!) 53 (!) 50 (!) 49  Resp: 11 15 10 10   Temp:      TempSrc:      SpO2: 100% 100% 100% 100%  Weight:      Height:         Physical Exam:  General: thin chronically ill appearing gentleman  HEENT: [place] AT Eyes: keeps eyes closed most of the time - despite request to open Neck: supple trachea midline Heart: bradycardic, S1S2 no rub Lungs: clear to auscultation  Abdomen: soft/NT/ND Extremities: no edema  Skin: no rash on extremities exposed Neuro: awake keeps eyes closed; oriented to person and year and will not answer other questions; initially states "you know my name" and initially will not provide Psych - frustrated easily GU foley in place Access: left IJ nontunneled catheter  Medications reviewed   Labs:  BMP Latest Ref Rng & Units 10/08/2020 10/07/2020 10/07/2020  Glucose 70 - 99 mg/dL 112(H) 163(H) 119(H)  BUN 6 - 20 mg/dL 43(H) 97(H) 96(H)  Creatinine 0.61 - 1.24 mg/dL 4.14(H) 9.43(H) 9.70(H)  Sodium 135 - 145 mmol/L 139 143 143  Potassium 3.5 - 5.1 mmol/L 3.2(L) 2.9(L) 2.9(L)  Chloride 98 - 111 mmol/L 108 113(H) 114(H)  CO2 22 - 32 mmol/L 25 18(L) 19(L)  Calcium 8.9 - 10.3 mg/dL 7.1(L)  6.4(LL) 6.6(L)     Assessment/Plan:   # AKI on CKD stage V - hx of advanced CKD and chronic obstruction and chronic indwelling foley.  This event will likely lead to dialysis dependence/ESRD as baseline Cr 4-5.  I have discussed this with the patient and his family.  Note noncompliance with outpatient nephrology follow-up.  Started CRRT on 7/9 after nontunneled catheter with critical care - Continue CRRT today with UF zero UF; 4K fluids - Plan for transition to iHD soon - reassess tomorrow  - Will also need to place tunneled catheter this week if tolerates iHD and if goals remain the same   # Metabolic acidosis - setting of sepsis and AKI on CKD - on CRRT   #  Sepsis - abx per primary team   # Chronic urinary retention - continue foley catheter - missed scheduled TURP per charting - would consult urology   # Hypokalemia - 4K fluids - giving additional repletion   # Encephalopathy/AMS - setting of AKI as well as acidosis.  Improved and communicative   # Hx CVA - left hemiparesis - previously at SNF and noted Chase [name] presented from home most recently   # Anemia - normocytic.  His son did consent for transfusion of blood products on 7/9.  Transfuse 1 unit PRBC's today   # hypophosphatemia - replete  Would ultimately benefit from discharge to a SNF if Chase [name] is to continue with aggressive care - Chase [name] has not been able to manage his care or live independently.  His son is in agreement  Claudia Desanctis, MD 10/08/2020 6:41 AM

## 2020-10-08 NOTE — Consult Note (Signed)
[place] Nurse ostomy consult note [place] Nurse consulted for supply ordering by Bedside RN.  Review of record reveals ostomy products being used (2-piece 2 and 3/4 inch ostomy pouching system with skin barrier ring). Ostomy orders entered and supply order numbers provided.  [place] nursing team will not follow, but will remain available to this patient, the nursing and medical teams.  Please re-consult if needed. Thanks, Maudie Flakes, MSN, RN, [place], Arther Abbott  Pager# [phone]

## 2020-10-08 NOTE — Plan of Care (Signed)
  Problem: Clinical Measurements: Goal: Ability to maintain clinical measurements within normal limits will improve Outcome: Progressing Goal: Will remain free from infection Outcome: Progressing Goal: Diagnostic test results will improve Outcome: Progressing Goal: Respiratory complications will improve Outcome: Progressing Goal: Cardiovascular complication will be avoided Outcome: Progressing   Problem: Nutrition: Goal: Adequate nutrition will be maintained Outcome: Progressing   Problem: Activity: Goal: Risk for activity intolerance will decrease Outcome: Progressing   Problem: Nutrition: Goal: Adequate nutrition will be maintained Outcome: Progressing   Problem: Coping: Goal: Level of anxiety will decrease Outcome: Progressing   Problem: Elimination: Goal: Will not experience complications related to bowel motility Outcome: Progressing Goal: Will not experience complications related to urinary retention Outcome: Progressing   Problem: Pain Managment: Goal: General experience of comfort will improve Outcome: Progressing   Problem: Safety: Goal: Ability to remain free from injury will improve Outcome: Progressing

## 2020-10-08 NOTE — Progress Notes (Signed)
NAME:  Chase [name], MRN:  [id number], DOB:  16-May-1965, LOS: 2 ADMISSION DATE:  10/06/2020, CONSULTATION DATE:  10/07/2020 REFERRING MD:  Dr. Wynetta Emery , CHIEF COMPLAINT:  Acute renal failure    History of Present Illness:  Chase [name] is a 55 y.o. male with a PMH significant for CKD stage IV, hx of GIB and ulcers requiring gastric bypass, prior stroke, hodgkin's lymphoma, prior MI, cervical radiculopathy, chronic pain syndrome, COVID, and PTSD who presented to the ED via EMS after being found minimally responsive on welfare check. On EMS arrival patient was found in recliner alert but confused. On chart review it appears patient was scheduled for pre op lab work 7/6 and TURP 7/7 but failed to show for either apt.   Patient has an extensive past medical history to include CKD stage 5 that progressed to acute renal failure march of this year and required CRRT. He also has indwelling catheter and bilateral nephrostomy tubes for hydronephrosis. He presents altered but in stable condition with no immediate needs for hemodialysis. Acidosis has improved with bicarb drip. Nephrology has already been consult.   Pertinent  Medical History  Significant for but not limited to: CKD stage IV, hx of GIB and ulcers requiring gastric bypass, prior stroke, hodgkin's lymphoma, prior MI, cervical radiculopathy, chronic pain syndrome, COVID, and PTSD   Significant Hospital Events:  7/8 admitted per [facility] for acute renal failure with multiple metabolic derangements 7/9 Transferred from [facility] to [facility] with PCCM and nephrology consult   Interim History / Subjective:  7/10: pt to eval by IR for b/l nephrostomy tubes. Pt has been refusing much of his care since he has been in the ICU concerning for overall prognosis in light of these issues. Refusing turning, medications etc. On crrt at this time and pressors (low dose).  "I just want pain medication"  Objective   Blood pressure 110/62, pulse (!) 46, temperature (!)  97.3 F (36.3 C), temperature source Oral, resp. rate 13, height 5\' 6"  (1.676 m), weight 54.1 kg, SpO2 96 %.        Intake/Output Summary (Last 24 hours) at 10/08/2020 0840 Last data filed at 10/08/2020 0800 Gross per 24 hour  Intake 2140.13 ml  Output 731 ml  Net 1409.13 ml   Filed Weights   10/06/20 1711 10/08/20 0500  Weight: 65.7 kg 54.1 kg    Examination: General: Thin cachetic chronically ill appearing elderly male lying in bed, in NAD HEENT: [place]/AT, MM pink/moist, PERRL Neuro: Alert but only oriented to self, non-focal  CV: s1s2 regular rate and rhythm, no murmur, rubs, or gallops,  PULM:  clear to ascultation, no increased work of breathing, no added breath sounds GI: soft, bowel sounds active in all 4 quadrants, non-tender, non-distended Extremities: warm/dry, no edema  Skin: no rashes or lesions  Resolved Hospital Problem list     Assessment & Plan:  Acute renal failure superimposed on CKD stage IV -Creatinine on discharge from last hospitalitis was 4.07, Creatinine on admission 11.49, BUN 105, no hyperkalemia  P: Nephrology consulted, appreciate assistance  On crrt per nephro Follow renal function  Monitor urine output Avoid nephrotoxins Ensure adequate renal perfusion  -nephrostomy tubes  Hx of bilateral hydronephrosis with chronic indwelling foley  Hx of BPH  -Per chart review bilateral nephrostomy tube were last exchanged 10/02/2020 -Was scheduled to see Dr. Alinda Money 7/722 for urology procedure (unsure if this was a stent exchange vs TRUP) but failed to present to visit P:  Consult IR for b/l nephrostomy tubes  Once improved from current presentation will eval with urology for TURP Empiric antibiotics If aggressive measures are pursued will need to consult urology to determine need for future TURP  Acute metabolic encephalopathy secondary to renal failure and uremia  P: Acidosis resolved  Metabolic acidosis  -pH 1.61 with bicarb 6.1 and CO2 12.9 on admit   P: Resolved with crrt and bicarb gtt -dc bicarb at this time.   Hypokalemia  P: Supplement iv as pt has been refusing po  Anemia of chronic disease -Hgb 7.5 on admit  P: Trend CBC  Transfuse per protocol  Hgb goal > 7  Failure to thrive  Deconditioning  P: Patient presents after being found down on wellness check with what appears to be failure to care for self as he has failed to follow up on multiple occasions and severe deconditioning. Patient has been seen by palliative care previously. Will need to coordinate with all consulting providers to help determine next best care. Patient is likely poor candidate for long term hemodialysis.    Best Practice  Diet/type: Regular consistency (see orders) DVT prophylaxis: systemic heparin GI prophylaxis: PPI Lines: Dialysis Catheter Foley:  N/A Code Status:  DNR Last date of multidisciplinary goals of care discussion 7/9  Labs   CBC: Recent Labs  Lab 10/06/20 1805 10/07/20 0535 10/08/20 0309  WBC 6.0 3.8* 4.3  NEUTROABS 4.7  --   --   HGB 9.3* 7.5* 7.2*  HCT 29.0* 21.7* 20.9*  MCV 97.3 93.9 90.5  PLT 233 202 096    Basic Metabolic Panel: Recent Labs  Lab 10/06/20 2312 10/07/20 0535 10/07/20 1100 10/07/20 1719 10/08/20 0309  NA 143 145 143 143 139  K 4.1 2.9* 2.9* 2.9* 3.2*  CL 118* 114* 114* 113* 108  CO2 7* 13* 19* 18* 25  GLUCOSE 121* 113* 119* 163* 112*  BUN 104* 98* 96* 97* 43*  CREATININE 10.73* 10.24* 9.70* 9.43* 4.14*  CALCIUM 7.1* 7.0* 6.6* 6.4* 7.1*  MG  --   --   --   --  2.4  PHOS  --   --   --  7.1* 2.4*   GFR: Estimated Creatinine Clearance: 15.2 mL/min (A) (by C-G formula based on SCr of 4.14 mg/dL (H)). Recent Labs  Lab 10/06/20 1746 10/06/20 1805 10/06/20 2312 10/07/20 0535 10/08/20 0309  PROCALCITON  --   --  0.17 0.17 0.13  WBC  --  6.0  --  3.8* 4.3  LATICACIDVEN 1.0  --   --   --   --     Liver Function Tests: Recent Labs  Lab 10/06/20 1805 10/07/20 0535 10/07/20 1719  10/08/20 0309  AST 14* 16  --   --   ALT 54* 40  --   --   ALKPHOS 129* 94  --   --   BILITOT 0.9 1.1  --   --   PROT 7.6 5.8*  --   --   ALBUMIN 3.4* 2.7* 2.4* 2.3*   No results for input(s): LIPASE, AMYLASE in the last 168 hours. No results for input(s): AMMONIA in the last 168 hours.  ABG    Component Value Date/Time   PHART 7.037 (LL) 10/06/2020 1830   PCO2ART 12.9 (LL) 10/06/2020 1830   PO2ART 126 (H) 10/06/2020 1830   HCO3 13.3 (L) 10/07/2020 0535   TCO2 23 04/18/2020 1205   ACIDBASEDEF 13.3 (H) 10/07/2020 0535   O2SAT 39.0 10/07/2020 0535  Coagulation Profile: No results for input(s): INR, PROTIME in the last 168 hours.  Cardiac Enzymes: No results for input(s): CKTOTAL, CKMB, CKMBINDEX, TROPONINI in the last 168 hours.  HbA1C: Hgb A1c MFr Bld  Date/Time Value Ref Range Status  10/18/2019 12:13 AM 5.4 4.8 - 5.6 % Final    Comment:    (NOTE) Pre diabetes:          5.7%-6.4%  Diabetes:              >6.4%  Glycemic control for   <7.0% adults with diabetes     CBG: Recent Labs  Lab 10/06/20 1905  GLUCAP 115*   Critical care time: The patient is critically ill with multiple organ systems failure and requires high complexity decision making for assessment and support, frequent evaluation and titration of therapies, application of advanced monitoring technologies and extensive interpretation of multiple databases.  Critical care time 38 mins. This represents my time independent of the NPs time taking care of the pt. This is excluding procedures.    Audria Nine DO [facility] 10/08/2020, 8:41 AM See Amion for pager If no response to pager, please call [phone] until 1900 After 1900 please call [facility] [phone]

## 2020-10-08 NOTE — Plan of Care (Signed)
  Problem: Clinical Measurements: Goal: Ability to maintain clinical measurements within normal limits will improve Outcome: Progressing Goal: Will remain free from infection Outcome: Progressing Goal: Diagnostic test results will improve Outcome: Progressing Goal: Respiratory complications will improve Outcome: Progressing Goal: Cardiovascular complication will be avoided Outcome: Progressing   Problem: Clinical Measurements: Goal: Ability to maintain clinical measurements within normal limits will improve 10/08/2020 0055 by Lorane Gell, RN Outcome: Progressing 10/08/2020 0055 by Lorane Gell, RN Outcome: Progressing Goal: Will remain free from infection 10/08/2020 0055 by Lorane Gell, RN Outcome: Progressing 10/08/2020 0055 by Lorane Gell, RN Outcome: Progressing Goal: Diagnostic test results will improve 10/08/2020 0055 by Lorane Gell, RN Outcome: Progressing 10/08/2020 0055 by Lorane Gell, RN Outcome: Progressing Goal: Respiratory complications will improve 10/08/2020 0055 by Lorane Gell, RN Outcome: Progressing 10/08/2020 0055 by Lorane Gell, RN Outcome: Progressing Goal: Cardiovascular complication will be avoided 10/08/2020 0055 by Lorane Gell, RN Outcome: Progressing 10/08/2020 0055 by Lorane Gell, RN Outcome: Progressing   Problem: Elimination: Goal: Will not experience complications related to bowel motility 10/08/2020 0055 by Lorane Gell, RN Outcome: Progressing 10/08/2020 0055 by Lorane Gell, RN Outcome: Progressing   Problem: Elimination: Goal: Will not experience complications related to urinary retention 10/08/2020 0055 by Lorane Gell, RN Outcome: Progressing 10/08/2020 0055 by Lorane Gell, RN Outcome: Progressing   Problem: Pain Managment: Goal: General experience of comfort will improve 10/08/2020 0055 by Lorane Gell, RN Outcome: Progressing 10/08/2020 0055 by Lorane Gell, RN Outcome: Progressing   Problem:  Safety: Goal: Ability to remain free from injury will improve 10/08/2020 0055 by Lorane Gell, RN Outcome: Progressing 10/08/2020 0055 by Lorane Gell, RN Outcome: Progressing   Problem: Skin Integrity: Goal: Risk for impaired skin integrity will decrease 10/08/2020 0055 by Lorane Gell, RN Outcome: Progressing 10/08/2020 0055 by Lorane Gell, RN Outcome: Progressing Presented with Pressure Ulcer from home

## 2020-10-08 NOTE — H&P (Signed)
Chief Complaint: Hydronephrosis  Referring Physician(s): Gerald Leitz  Supervising Physician: Mir, Sharen Heck  Patient Status: [facility] - In-pt  History of Present Illness: Chase [name] is a 55 y.o. 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  He has a history of previous PCNs placed back in January by Dr. Kathlene Cote.  According to the chart, he was scheduled to have a TURP this week but he didn't show up for the pre-op labs nor the procedure.    He was found to be unresponsive on a welfare check and was brought to the hospital via EMS.  He was found to be in renal failure and started on CRRT via temp cath placed in ICU.  CT showed= Bilateral hydronephrosis, moderate-to-severe in degree, despite nephroureteral stents which appear appropriately positioned, indicating bladder outlet obstruction.  We are asked to place bilateral nephrostomy tubes.  He is NPO.   Past Medical History:  Diagnosis Date   Anemia    Cervical radiculopathy    with left sided weakness   Chemical exposure    service related injury   Chronic pain    CKD (chronic kidney disease), stage IV ([place]) 10/17/2019   COVID-19    Dyspnea    GERD (gastroesophageal reflux disease)    GIB (gastrointestinal bleeding)    Headache    Heart attack ([place])    History of CVA (cerebrovascular accident) 12/31/2019   History of gastric bypass 08/24/2019   History of stomach ulcers 05/19/2013   Formatting of this note might be different from the original. Attributed to chronic ibuprofen use   Hodgkin lymphoma, unspecified, unspecified site ([place]) 12/10/2012   Formatting of this note might be different from the original. In remission.   Hypokalemia    Hypomagnesemia    PTSD (post-traumatic stress disorder)    Renal disorder    CKD   Stroke [facility])    Urinary retention    due to bladder outlet obstruction    Past Surgical History:  Procedure Laterality  Date   CHOLECYSTECTOMY     COLONOSCOPY     COLONOSCOPY WITH PROPOFOL N/A 04/18/2020   Procedure: COLONOSCOPY WITH PROPOFOL;  Surgeon: Doran Stabler, MD;  Location: [place];  Service: Gastroenterology;  Laterality: N/A;   CYSTOSCOPY W/ URETERAL STENT PLACEMENT N/A 11/14/2019   Procedure: CYSTOSCOPY WITH RETROGRADE PYELOGRAM bilateral Wyvonnia Dusky STENT PLACEMENT left fulguration bladder . cystogram;  Surgeon: Raynelle Bring, MD;  Location: [facility];  Service: Urology;  Laterality: N/A;   CYSTOSCOPY W/ URETERAL STENT PLACEMENT Bilateral 12/25/2019   Procedure: CYSTOSCOPY WITH bilateral  RETROGRADE PYELOGRAM/ leftURETERAL STENT PLACEMENT;  Surgeon: Lucas Mallow, MD;  Location: [facility];  Service: Urology;  Laterality: Bilateral;   CYSTOSCOPY W/ URETERAL STENT PLACEMENT Bilateral 07/06/2020   Procedure: CYSTOSCOPY WITH BILATERAL STENT EXCHANGE; BILATERAL PYELOGRAM RETROGRADE;  Surgeon: Raynelle Bring, MD;  Location: [facility];  Service: Urology;  Laterality: Bilateral;   CYSTOSCOPY WITH URETEROSCOPY AND STENT PLACEMENT N/A 04/18/2020   Procedure: CYSTOSCOPY WITH BILATERAL URETERAL STENT CHANGE PLACEMENT;  Surgeon: Ardis Hughs, MD;  Location: [place];  Service: Urology;  Laterality: N/A;   EPIGASTRIC HERNIA REPAIR N/A 04/18/2020   Procedure: HERNIA REPAIR EPIGASTRIC ADULT;  Surgeon: Michael Boston, MD;  Location: [place];  Service: General;  Laterality: N/A;   GASTRIC BYPASS     HERNIA REPAIR     IR FLUORO GUIDE CV LINE RIGHT  04/01/2020   IR NEPHRO TUBE REMOV/FL  04/25/2020   IR NEPHROSTOMY PLACEMENT LEFT  12/27/2019   IR NEPHROSTOMY PLACEMENT LEFT  04/01/2020   IR NEPHROSTOMY PLACEMENT RIGHT  11/16/2019   IR NEPHROSTOMY PLACEMENT RIGHT  12/27/2019   IR NEPHROSTOMY PLACEMENT RIGHT  04/01/2020   IR SINUS/FIST TUBE CHK-NON GI  12/03/2019   IR URETERAL STENT PLACEMENT EXISTING ACCESS RIGHT  12/08/2019   IR URETERAL STENT PLACEMENT EXISTING ACCESS RIGHT  01/26/2020   IR US GUIDE VASC  ACCESS RIGHT  04/01/2020   LAPAROSCOPIC LOOP COLOSTOMY N/A 04/18/2020   Procedure: LAPAROSCOPIC SIGMOID COLOTOMY WITH COLOSTOMY;  Surgeon: Michael Boston, MD;  Location: [place];  Service: General;  Laterality: N/A;    Allergies: Nsaids and Ciprofloxacin  Medications: Prior to Admission medications   Medication Sig Start Date End Date Taking? Authorizing Provider  cyanocobalamin (,VITAMIN B-12,) 1000 MCG/ML injection Inject 1,000 mcg into the muscle every 30 (thirty) days.  12/28/13  Yes [provider]  DULoxetine (CYMBALTA) 30 MG capsule Take 1 capsule (30 mg total) by mouth 2 (two) times daily. 07/07/20 07/07/21 Yes Sheikh, Omair Latif, DO  hydrOXYzine (ATARAX/VISTARIL) 10 MG tablet Take 1 tablet (10 mg total) by mouth 3 (three) times daily as needed for itching or anxiety. 07/07/20  Yes Sheikh, Omair Latif, DO  Multiple Vitamin (MULTIVITAMIN WITH MINERALS) TABS tablet Take 1 tablet by mouth daily. 05/03/20  Yes Samella Parr, NP  pantoprazole (PROTONIX) 20 MG tablet TAKE 1 TABLET (20 MG TOTAL) BY MOUTH 2 (TWO) TIMES DAILY. Patient taking differently: Take 20 mg by mouth 2 (two) times daily. 05/03/20 05/03/21 Yes Samella Parr, NP  silodosin (RAPAFLO) 8 MG CAPS capsule Take 1 capsule (8 mg total) by mouth daily with breakfast. 07/07/20  Yes Sheikh, Omair Latif, DO  sodium bicarbonate 650 MG tablet TAKE 1 TABLET (650 MG TOTAL) BY MOUTH 2 (TWO) TIMES DAILY. Patient taking differently: Take 650 mg by mouth 2 (two) times daily. 05/03/20 05/03/21 Yes Samella Parr, NP  acetaminophen (TYLENOL) 500 MG tablet Take 1 tablet (500 mg total) by mouth every 6 (six) hours as needed for mild pain, fever or headache. Patient not taking: No sig reported 05/01/20   Samella Parr, NP  calcium carbonate (TUMS - DOSED IN MG ELEMENTAL CALCIUM) 500 MG chewable tablet Chew 1 tablet (200 mg of elemental calcium total) by mouth 3 (three) times daily. Patient not taking: No sig reported 05/01/20   Samella Parr, NP   feeding supplement (ENSURE ENLIVE / ENSURE PLUS) LIQD Take 237 mLs by mouth 2 (two) times daily between meals. Patient not taking: Reported on 10/07/2020 07/07/20   Raiford Noble Latif, DO  fentaNYL (DURAGESIC) 25 MCG/HR Place 1 patch onto the skin every 3 (three) days. Patient not taking: Reported on 10/07/2020 07/08/20   Raiford Noble Latif, DO  folic acid (FOLVITE) 1 MG tablet Take 1 tablet (1 mg total) by mouth daily. Patient not taking: Reported on 10/07/2020 07/08/20   Raiford Noble Latif, DO  gabapentin (NEURONTIN) 100 MG capsule TAKE 2 CAPSULES (200 MG TOTAL) BY MOUTH DAILY. Patient not taking: Reported on 10/07/2020 05/03/20 05/03/21  Samella Parr, NP  gabapentin (NEURONTIN) 300 MG capsule TAKE 1 CAPSULE (300 MG TOTAL) BY MOUTH AT BEDTIME. Patient not taking: Reported on 10/07/2020 05/03/20 05/03/21  Samella Parr, NP  Nutritional Supplements (,FEEDING SUPPLEMENT, PROSOURCE PLUS) liquid Take 30 mLs by mouth 2 (two) times daily between meals. Patient not taking: Reported on 10/07/2020 07/07/20   Raiford Noble Latif, DO  polycarbophil (  FIBERCON) 625 MG tablet Take 1 tablet (625 mg total) by mouth 2 (two) times daily. Patient not taking: Reported on 10/07/2020 05/01/20   Samella Parr, NP  polyethylene glycol (MIRALAX / GLYCOLAX) 17 g packet Take 17 g by mouth daily as needed for moderate constipation. Patient not taking: Reported on 10/07/2020 07/07/20   Raiford Noble Latif, DO  Vitamin D3 (VITAMIN D) 25 MCG tablet TAKE 800 UNITS BY MOUTH DAILY. Patient not taking: Reported on 10/07/2020 05/03/20 05/03/21  Samella Parr, NP     Family History  Problem Relation Age of Onset   Renal cancer Father        mets to liver and lungs and pancreas   Heart disease Father    Heart disease Paternal Grandfather    Colon cancer Maternal Grandmother    Colon cancer Paternal Uncle    Stomach cancer Neg Hx    Esophageal cancer Neg Hx    Pancreatic cancer Neg Hx     Social History   Socioeconomic History   Marital  status: Widowed    Spouse name: Not on file   Number of children: Not on file   Years of education: Not on file   Highest education level: Not on file  Occupational History   Not on file  Tobacco Use   Smoking status: Never   Smokeless tobacco: Current  Vaping Use   Vaping Use: Never used  Substance and Sexual Activity   Alcohol use: Never   Drug use: Never   Sexual activity: Not on file  Other Topics Concern   Not on file  Social History Narrative   Widow, Civil Service fast streamer served in [employer] gets care through the [place] as well   2 sons   Lives alone, his mother has been helping him in 2021 with illnesses   Former smoker no alcohol  or drug use now   Does use smokeless tobacco   2 caffeinated beverages daily   Social Determinants of Radio broadcast assistant Strain: Not on file  Food Insecurity: Not on file  Transportation Needs: Not on file  Physical Activity: Not on file  Stress: Not on file  Social Connections: Not on file    Review of Systems  Unable to perform ROS: Mental status change   Vital Signs: BP 110/64   Pulse (!) 49   Temp (!) 97.3 F (36.3 C) (Oral)   Resp 17   Ht 5\' 6"  (1.676 m)   Wt 54.1 kg   SpO2 98%   BMI 19.25 kg/m   Physical Exam Constitutional:      Appearance: Normal appearance.  HENT:     Head: Normocephalic and atraumatic.  Cardiovascular:     Rate and Rhythm: Bradycardia present.  Pulmonary:     Effort: Pulmonary effort is normal. No respiratory distress.  Abdominal:     Palpations: Abdomen is soft.     Tenderness: There is no abdominal tenderness.  Skin:    General: Skin is warm and dry.  Neurological:     General: No focal deficit present.     Mental Status: He is alert.    Imaging: CT ABDOMEN PELVIS WO CONTRAST  Result Date: 10/07/2020 CLINICAL DATA:  Renal failure. Altered mental status. Scheduled for TURP. EXAM: CT ABDOMEN AND PELVIS WITHOUT CONTRAST TECHNIQUE: Multidetector CT imaging of the abdomen and pelvis  was performed following the standard protocol without IV contrast. COMPARISON:  CT abdomen dated 06/27/2020. FINDINGS: Lower chest: Pneumomediastinum, incompletely  imaged. Lung bases are clear. Hepatobiliary: Status post cholecystectomy. No focal liver abnormality is identified. Pancreas: Unremarkable. No pancreatic ductal dilatation or surrounding inflammatory changes. Spleen: Normal in size without focal abnormality. Adrenals/Urinary Tract: LEFT adrenal fullness without discrete mass. RIGHT adrenal gland is unremarkable. Bilateral hydronephrosis, moderate-to-severe in degree, despite nephroureteral stents which appear appropriately positioned. Bladder is partially decompressed by Foley catheter. Stomach/Bowel: Status post gastric surgery. 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 No dilated large or small bowel loops are identified. Vascular/Lymphatic: Aortic atherosclerosis. No abdominal aortic aneurysm. No enlarged lymph nodes are seen. Reproductive: Stable prostate enlargement. Other: No free fluid or abscess collection is identified. No free intraperitoneal air. Musculoskeletal: No acute or suspicious osseous abnormality. Mild degenerative spondylosis of the thoracolumbar spine. IMPRESSION: 1. Pneumomediastinum, incompletely imaged. Recommend chest CT for complete characterization. 2. Bilateral hydronephrosis, moderate-to-severe in degree, despite nephroureteral stents which appear appropriately positioned, indicating bladder outlet obstruction. 3. 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 No bowel obstruction. 4. Stable prostate enlargement. Aortic Atherosclerosis (ICD10-I70.0). Critical Value/emergent results were called by telephone at the time of interpretation on 10/07/2020 at 5:21 am to provider Mesner, who verbally acknowledged these results. Electronically Signed   By: Franki Cabot M.D.   On: 10/07/2020 05:22   CT CHEST WO  CONTRAST  Result Date: 10/07/2020 CLINICAL DATA:  Abnormal x-ray. Pneumomediastinum seen on today's earlier CT abdomen. EXAM: CT CHEST WITHOUT CONTRAST TECHNIQUE: Multidetector CT imaging of the chest was performed following the standard protocol without IV contrast. COMPARISON:  CT abdomen from earlier same day. FINDINGS: Cardiovascular: No thoracic aortic aneurysm. No pericardial effusion. Mediastinum/Nodes: Pneumomediastinum. Trachea and central bronchi are unremarkable. No mass or enlarged lymph nodes are seen within the mediastinum. Small hiatal hernia. Surgical anastomosis at the underlying gastroesophageal junction. Lungs/Pleura: Lungs are clear. No pleural effusion or pneumothorax is seen. Upper Abdomen: Surgical changes about the stomach and at the gastroesophageal junction. Severe bilateral hydronephrosis, incompletely imaged. No free intraperitoneal air is seen within the upper abdomen. Musculoskeletal: No chest wall mass or suspicious bone lesions identified. IMPRESSION: 1. Pneumomediastinum. The source of the pneumomediastinum is not clearly identified. One possibility would be dehiscence or erosion at the surgical anastomosis seen at the gastroesophageal junction. Differential considerations also include increased intrathoracic pressure related to recent coughing or vomiting, if clinical history correlates. 2. Lungs are clear. 3. Small hiatal hernia. 4. Severe bilateral hydronephrosis, incompletely imaged. This was characterized on today's earlier CT abdomen and pelvis. Electronically Signed   By: Franki Cabot M.D.   On: 10/07/2020 09:39   DG Chest Port 1 View  Result Date: 10/08/2020 CLINICAL DATA:  Pneumomediastinum. EXAM: PORTABLE CHEST 1 VIEW COMPARISON:  Chest CT dated 10/07/2020 and chest x-ray dated 10/07/2020. FINDINGS: Heart size and mediastinal contours are stable. Subtle evidence of mediastinum along the LEFT heart border. Lungs are clear. No pleural effusion or pneumothorax is seen.  LEFT IJ central line is stable in position with tip at the level of the upper SVC. IMPRESSION: 1. Subtle evidence of pneumomediastinum along the LEFT heart border. There has certainly been no increase in the degree of pneumomediastinum compared to yesterday's exams. 2. Lungs are clear. No pleural effusion or pneumothorax seen. Electronically Signed   By: Franki Cabot M.D.   On: 10/08/2020 08:42   DG CHEST PORT 1 VIEW  Result Date: 10/07/2020 CLINICAL DATA:  Status post central line placement EXAM: PORTABLE CHEST 1 VIEW COMPARISON:  10/06/2020 FINDINGS: Cardiac shadow is stable. Left  jugular temporary dialysis catheter is noted with the tip in the proximal superior vena cava. No pneumothorax is noted. Lungs are clear. No bony abnormality is noted. IMPRESSION: No pneumothorax following temporary dialysis catheter placement. Electronically Signed   By: Inez Catalina M.D.   On: 10/07/2020 19:51   DG Chest Portable 1 View  Result Date: 10/06/2020 CLINICAL DATA:  Altered mental status. EXAM: PORTABLE CHEST 1 VIEW COMPARISON:  Single-view of the chest 06/19/2020. FINDINGS: The patient is rotated on the study. Lungs clear. Heart size normal. No pneumothorax or pleural fluid. No acute or focal bony abnormality. There is partial visualization of a double-J stent in the right kidney. Surgical clips right upper quadrant noted. IMPRESSION: No acute disease. Electronically Signed   By: Inge Rise M.D.   On: 10/06/2020 19:04    Labs:  CBC: Recent Labs    07/07/20 0605 10/06/20 1805 10/07/20 0535 10/08/20 0309  WBC 3.4* 6.0 3.8* 4.3  HGB 8.5* 9.3* 7.5* 7.2*  HCT 28.0* 29.0* 21.7* 20.9*  PLT 162 233 202 174    COAGS: Recent Labs    10/17/19 1241 10/17/19 1311 11/07/19 1836 11/07/19 1853 11/10/19 1052 12/27/19 0450 01/26/20 1050 03/31/20 1420 04/16/20 1354  INR 1.5* 1.6*   < >  --    < > 1.3* 1.2 1.5* 1.0  APTT 43* 46*  --  31  --   --   --  37*  --    < > = values in this interval not  displayed.    BMP: Recent Labs    01/01/20 0430 01/02/20 0427 01/03/20 0506 01/04/20 0453 01/05/20 0558 10/07/20 0535 10/07/20 1100 10/07/20 1719 10/08/20 0309  NA 143 144 143 143   < > 145 143 143 139  K 3.5 3.9 3.9 3.9   < > 2.9* 2.9* 2.9* 3.2*  CL 116* 113* 112* 112*   < > 114* 114* 113* 108  CO2 18* 19* 22 23   < > 13* 19* 18* 25  GLUCOSE 73 84 81 77   < > 113* 119* 163* 112*  BUN 60* 61* 53* 47*   < > 98* 96* 97* 43*  CALCIUM 7.3* 7.5* 7.5* 7.4*   < > 7.0* 6.6* 6.4* 7.1*  CREATININE 6.54* 6.27* 6.42* 5.74*   < > 10.24* 9.70* 9.43* 4.14*  GFRNONAA 9* 9* 9* 10*   < > 5* 6* 6* 16*  GFRAA 10* 11* 10* 12*  --   --   --   --   --    < > = values in this interval not displayed.    LIVER FUNCTION TESTS: Recent Labs    07/06/20 0456 07/07/20 0605 10/06/20 1805 10/07/20 0535 10/07/20 1719 10/08/20 0309  BILITOT 0.7 0.6 0.9 1.1  --   --   AST 21 17 14* 16  --   --   ALT 14 12 54* 40  --   --   ALKPHOS 81 77 129* 94  --   --   PROT 4.4* 4.3* 7.6 5.8*  --   --   ALBUMIN 1.7*  1.7* 1.7*  1.6* 3.4* 2.7* 2.4* 2.3*    TUMOR MARKERS: No results for input(s): AFPTM, CEA, CA199, CHROMGRNA in the last 8760 hours.  Assessment and Plan:  Chronic obstructive uropathy with bilateral hydronephrosis causing acute renal failure.  Will proceed with image guided placement of bilateral percutaneous nephrostomy tubes today by Dr. Dwaine Gale.  Risks and benefits of bilateral PCN placement was discussed with  the patient and his son including, but not limited to, infection, bleeding, significant bleeding causing loss or decrease in renal function or damage to adjacent structures.   All questions were answered, patient is agreeable to proceed.  Consent obtained from son.  Thank you for this interesting consult.  I greatly enjoyed meeting Chase [name] and look forward to participating in their care.  A copy of this report was sent to the requesting provider on this date.  Electronically  Signed: Murrell Redden, PA-C   10/08/2020, 8:57 AM      I spent a total of   25 Minutes in face to face in clinical consultation, greater than 50% of which was counseling/coordinating care for bilateral PCNs.

## 2020-10-08 NOTE — Progress Notes (Signed)
Subjective: Patient reports without complaint.  On CRRT.  Objective: Vital signs in last 24 hours: Temp:  [97.3 F (36.3 C)-97.8 F (36.6 C)] 97.8 F (36.6 C) (07/10 1024) Pulse Rate:  [36-119] 53 (07/10 1400) Resp:  [9-22] 18 (07/10 1400) BP: (89-142)/(48-86) 141/71 (07/10 1400) SpO2:  [87 %-100 %] 100 % (07/10 1400) Weight:  [54.1 kg] 54.1 kg (07/10 0500)  Intake/Output from previous day: 07/09 0701 - 07/10 0700 In: 2097.9 [P.O.:120; I.V.:1305.5; IV Piggyback:672.4] Out: 729 [Urine:745] Intake/Output this shift: Total I/O In: 547.9 [I.V.:2.4; Blood:324; IV Piggyback:221.5] Out: 130 [Urine:470; Other:3]  Physical Exam:  Cachectic, resting comfortably, no acute distress Bilateral nephrostomy tubes in place Foley catheter in place with minimal urine output  Lab Results: Recent Labs    10/06/20 1805 10/07/20 0535 10/08/20 0309  HGB 9.3* 7.5* 7.2*  HCT 29.0* 21.7* 20.9*   BMET Recent Labs    10/07/20 1719 10/08/20 0309  NA 143 139  K 2.9* 3.2*  CL 113* 108  CO2 18* 25  GLUCOSE 163* 112*  BUN 97* 43*  CREATININE 9.43* 4.14*  CALCIUM 6.4* 7.1*   Recent Labs    10/08/20 0811  INR 1.1   No results for input(s): LABURIN in the last 72 hours. Results for orders placed or performed during the hospital encounter of 10/06/20  Culture, blood (routine x 2)     Status: None (Preliminary result)   Collection Time: 10/06/20  5:47 PM   Specimen: BLOOD RIGHT FOREARM  Result Value Ref Range Status   Specimen Description BLOOD RIGHT FOREARM  Final   Special Requests   Final    Blood Culture results may not be optimal due to an inadequate volume of blood received in culture bottles BOTTLES DRAWN AEROBIC AND ANAEROBIC   Culture   Final    NO GROWTH < 24 HOURS Performed at [facility], [address] [place], [place] [address]    Report Status PENDING  Incomplete  Culture, blood (routine x 2)     Status: None (Preliminary result)   Collection Time: 10/06/20  6:06  PM   Specimen: BLOOD RIGHT ARM  Result Value Ref Range Status   Specimen Description BLOOD RIGHT ARM  Final   Special Requests   Final    BOTTLES DRAWN AEROBIC AND ANAEROBIC Blood Culture adequate volume   Culture   Final    NO GROWTH < 24 HOURS Performed at [facility], [address], [place], [place] [address]    Report Status PENDING  Incomplete  Urine culture     Status: Abnormal   Collection Time: 10/06/20  7:31 PM   Specimen: Urine, Clean Catch  Result Value Ref Range Status   Specimen Description   Final    URINE, CLEAN CATCH Performed at [facility], [address] [place], [place] [address]    Special Requests   Final    NONE Performed at [facility], [address], [place],  [address]    [place], SUGGEST RECOLLECTION (A)  Final   Report Status 10/08/2020 FINAL  Final  Resp Panel by RT-PCR (Flu A&B, Covid) Nasopharyngeal Swab     Status: None   Collection Time: 10/06/20  8:10 PM   Specimen: Nasopharyngeal Swab; Nasopharyngeal(NP) swabs in vial transport medium  Result Value Ref Range Status   SARS Coronavirus 2 by RT PCR NEGATIVE NEGATIVE Final    Comment: (NOTE) SARS-CoV-2 target nucleic acids are NOT DETECTED.  The SARS-CoV-2 RNA is generally detectable in  upper respiratory specimens during the acute phase of infection. The lowest concentration of SARS-CoV-2 viral copies this assay can detect is 138 copies/mL. A negative result does not preclude SARS-Cov-2 infection and should not be used as the sole basis for treatment or other patient management decisions. A negative result may occur with  improper specimen collection/handling, submission of specimen other than nasopharyngeal swab, presence of viral mutation(s) within the areas targeted by this assay, and inadequate number of viral copies(<138 copies/mL). A negative result must be combined with clinical observations, patient history, and epidemiological information. The  expected result is Negative.  Fact Sheet for Patients:  EntrepreneurPulse.com.au  Fact Sheet for Healthcare Providers:  IncredibleEmployment.be  This test is no t yet approved or cleared by the [place] FDA and  has been authorized for detection and/or diagnosis of SARS-CoV-2 by FDA under an Emergency Use Authorization (EUA). This EUA will remain  in effect (meaning this test can be used) for the duration of the COVID-19 declaration under Section 564(b)(1) of the Act, 21 U.S.C.section 360bbb-3(b)(1), unless the authorization is terminated  or revoked sooner.       Influenza A by PCR NEGATIVE NEGATIVE Final   Influenza B by PCR NEGATIVE NEGATIVE Final    Comment: (NOTE) The Xpert Xpress SARS-CoV-2/FLU/RSV plus assay is intended as an aid in the diagnosis of influenza from Nasopharyngeal swab specimens and should not be used as a sole basis for treatment. Nasal washings and aspirates are unacceptable for Xpert Xpress SARS-CoV-2/FLU/RSV testing.  Fact Sheet for Patients: EntrepreneurPulse.com.au  Fact Sheet for Healthcare Providers: IncredibleEmployment.be  This test is not yet approved or cleared by the [place] FDA and has been authorized for detection and/or diagnosis of SARS-CoV-2 by FDA under an Emergency Use Authorization (EUA). This EUA will remain in effect (meaning this test can be used) for the duration of the COVID-19 declaration under Section 564(b)(1) of the Act, 21 U.S.C. section 360bbb-3(b)(1), unless the authorization is terminated or revoked.  Performed at [facility], [address] [place], [place] [address]   MRSA Next Gen by PCR, Nasal     Status: None   Collection Time: 10/07/20  1:37 PM   Specimen: Nasal Mucosa; Nasal Swab  Result Value Ref Range Status   MRSA by PCR Next Gen NOT DETECTED NOT DETECTED Final    Comment: (NOTE) The GeneXpert MRSA Assay (FDA approved for  NASAL specimens only), is one component of a comprehensive MRSA colonization surveillance program. It is not intended to diagnose MRSA infection nor to guide or monitor treatment for MRSA infections. Test performance is not FDA approved in patients less than 12 years old. Performed at [facility], [place] [address] [place], [place] [address]     Studies/Results: CT ABDOMEN PELVIS WO CONTRAST  Result Date: 10/07/2020 CLINICAL DATA:  Renal failure. Altered mental status. Scheduled for TURP. EXAM: CT ABDOMEN AND PELVIS WITHOUT CONTRAST TECHNIQUE: Multidetector CT imaging of the abdomen and pelvis was performed following the standard protocol without IV contrast. COMPARISON:  CT abdomen dated 06/27/2020. FINDINGS: Lower chest: Pneumomediastinum, incompletely imaged. Lung bases are clear. Hepatobiliary: Status post cholecystectomy. No focal liver abnormality is identified. Pancreas: Unremarkable. No pancreatic ductal dilatation or surrounding inflammatory changes. Spleen: Normal in size without focal abnormality. Adrenals/Urinary Tract: LEFT adrenal fullness without discrete mass. RIGHT adrenal gland is unremarkable. Bilateral hydronephrosis, moderate-to-severe in degree, despite nephroureteral stents which appear appropriately positioned. Bladder is partially decompressed by Foley catheter. Stomach/Bowel: Status post gastric surgery. Surgical changes of  partial colectomy with LEFT lower quadrant colostomy. No dilated large or small bowel loops are identified. Vascular/Lymphatic: Aortic atherosclerosis. No abdominal aortic aneurysm. No enlarged lymph nodes are seen. Reproductive: Stable prostate enlargement. Other: No free fluid or abscess collection is identified. No free intraperitoneal air. Musculoskeletal: No acute or suspicious osseous abnormality. Mild degenerative spondylosis of the thoracolumbar spine. IMPRESSION: 1. Pneumomediastinum, incompletely imaged. Recommend chest CT for complete  characterization. 2. Bilateral hydronephrosis, moderate-to-severe in degree, despite nephroureteral stents which appear appropriately positioned, indicating bladder outlet obstruction. 3. 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 No bowel obstruction. 4. Stable prostate enlargement. Aortic Atherosclerosis (ICD10-I70.0). Critical Value/emergent results were called by telephone at the time of interpretation on 10/07/2020 at 5:21 am to provider Mesner, who verbally acknowledged these results. Electronically Signed   By: Franki Cabot M.D.   On: 10/07/2020 05:22   DG Chest 1 View  Result Date: 10/08/2020 CLINICAL DATA:  Central line placement. EXAM: CHEST  1 VIEW COMPARISON:  10/08/2020 at 5:23 a.m. FINDINGS: Patient is slightly rotated to the right. Left IJ central venous catheter has been advanced as tip is over the SVC horizontally oriented toward the lateral wall. Lungs are hypoinflated with persistent elevation of the right hemidiaphragm. No focal lobar consolidation, effusion or pneumothorax. Cardiomediastinal silhouette and remainder of the exam is unchanged. IMPRESSION: 1. Hypoinflation without acute cardiopulmonary disease. 2. Left IJ central venous catheter has been advanced with tip over the SVC horizontally oriented toward the lateral wall. Electronically Signed   By: Marin Olp M.D.   On: 10/08/2020 13:27   CT CHEST WO CONTRAST  Result Date: 10/07/2020 CLINICAL DATA:  Abnormal x-ray. Pneumomediastinum seen on today's earlier CT abdomen. EXAM: CT CHEST WITHOUT CONTRAST TECHNIQUE: Multidetector CT imaging of the chest was performed following the standard protocol without IV contrast. COMPARISON:  CT abdomen from earlier same day. FINDINGS: Cardiovascular: No thoracic aortic aneurysm. No pericardial effusion. Mediastinum/Nodes: Pneumomediastinum. Trachea and central bronchi are unremarkable. No mass or enlarged lymph nodes are seen within the mediastinum. Small hiatal  hernia. Surgical anastomosis at the underlying gastroesophageal junction. Lungs/Pleura: Lungs are clear. No pleural effusion or pneumothorax is seen. Upper Abdomen: Surgical changes about the stomach and at the gastroesophageal junction. Severe bilateral hydronephrosis, incompletely imaged. No free intraperitoneal air is seen within the upper abdomen. Musculoskeletal: No chest wall mass or suspicious bone lesions identified. IMPRESSION: 1. Pneumomediastinum. The source of the pneumomediastinum is not clearly identified. One possibility would be dehiscence or erosion at the surgical anastomosis seen at the gastroesophageal junction. Differential considerations also include increased intrathoracic pressure related to recent coughing or vomiting, if clinical history correlates. 2. Lungs are clear. 3. Small hiatal hernia. 4. Severe bilateral hydronephrosis, incompletely imaged. This was characterized on today's earlier CT abdomen and pelvis. Electronically Signed   By: Franki Cabot M.D.   On: 10/07/2020 09:39   IR US Guidance  Result Date: 10/08/2020 INDICATION: 55 year old gentleman with worsening renal failure was found to have severe bilateral hydronephrosis despite bilateral ureteral stents in place. Interventional radiology consulted for bilateral percutaneous nephrostomy drain placement. EXAM: Bilateral nephrostomy drain placement utilizing ultrasound and fluoroscopic guidance COMPARISON:  None. MEDICATIONS: 2 g Rocephin IV; The antibiotic was administered in an appropriate time frame prior to skin puncture. ANESTHESIA/SEDATION: Fentanyl 50 mcg IV; Versed 2 mg IV Moderate Sedation Time:  32 minutes The patient was continuously monitored during the procedure by the interventional radiology nurse under my direct supervision. FLUOROSCOPY TIME:  Fluoroscopy Time: 1 minutes  36 seconds (3.5 mGy). COMPLICATIONS: None immediate. PROCEDURE: Informed written consent was obtained from the patient after a thorough  discussion of the procedural risks, benefits and alternatives. All questions were addressed. Maximal Sterile Barrier Technique was utilized including caps, mask, sterile gowns, sterile gloves, sterile drape, hand hygiene and skin antiseptic. A timeout was performed prior to the initiation of the procedure. Bilateral flank skin prepped and draped in usual fashion. Sterile ultrasound probe cover and gel utilized throughout the procedure. Utilizing continuous ultrasound guidance, left midpole calyx was accessed with a 21 gauge needle. 21 gauge needle exchanged for transitional dilator set over 0.035 inch guidewire. Transitional dilator set exchanged for 10 French multipurpose pigtail drain over 0.035 inch guidewire. Drain secured to skin with suture and connected to bag. Contrast administered through the drain under fluoroscopy opacified the left renal collecting system and confirmed appropriate placement. Utilizing continuous ultrasound guidance, right lower pole calyx was accessed with a 21 gauge needle. 21 gauge needle exchanged for transitional dilator set over 0.035 inch guidewire. Transitional dilator set exchanged for 10 French multipurpose pigtail drain over 0.035 inch guidewire. Drain secured to skin with suture and connected to bag. Contrast administered through the drain under fluoroscopy opacified the right renal collecting system and confirmed appropriate placement. Purulent urine was aspirated from both renal collecting systems. Samples were sent for Gram stain and culture. IMPRESSION: Bilateral 10 French nephrostomy drains placed utilizing fluoroscopic and ultrasound guidance. Electronically Signed   By: Miachel Roux M.D.   On: 10/08/2020 13:08   IR US Guidance  Result Date: 10/08/2020 INDICATION: 55 year old gentleman with worsening renal failure was found to have severe bilateral hydronephrosis despite bilateral ureteral stents in place. Interventional radiology consulted for bilateral percutaneous  nephrostomy drain placement. EXAM: Bilateral nephrostomy drain placement utilizing ultrasound and fluoroscopic guidance COMPARISON:  None. MEDICATIONS: 2 g Rocephin IV; The antibiotic was administered in an appropriate time frame prior to skin puncture. ANESTHESIA/SEDATION: Fentanyl 50 mcg IV; Versed 2 mg IV Moderate Sedation Time:  32 minutes The patient was continuously monitored during the procedure by the interventional radiology nurse under my direct supervision. FLUOROSCOPY TIME:  Fluoroscopy Time: 1 minutes 36 seconds (3.5 mGy). COMPLICATIONS: None immediate. PROCEDURE: Informed written consent was obtained from the patient after a thorough discussion of the procedural risks, benefits and alternatives. All questions were addressed. Maximal Sterile Barrier Technique was utilized including caps, mask, sterile gowns, sterile gloves, sterile drape, hand hygiene and skin antiseptic. A timeout was performed prior to the initiation of the procedure. Bilateral flank skin prepped and draped in usual fashion. Sterile ultrasound probe cover and gel utilized throughout the procedure. Utilizing continuous ultrasound guidance, left midpole calyx was accessed with a 21 gauge needle. 21 gauge needle exchanged for transitional dilator set over 0.035 inch guidewire. Transitional dilator set exchanged for 10 French multipurpose pigtail drain over 0.035 inch guidewire. Drain secured to skin with suture and connected to bag. Contrast administered through the drain under fluoroscopy opacified the left renal collecting system and confirmed appropriate placement. Utilizing continuous ultrasound guidance, right lower pole calyx was accessed with a 21 gauge needle. 21 gauge needle exchanged for transitional dilator set over 0.035 inch guidewire. Transitional dilator set exchanged for 10 French multipurpose pigtail drain over 0.035 inch guidewire. Drain secured to skin with suture and connected to bag. Contrast administered through the  drain under fluoroscopy opacified the right renal collecting system and confirmed appropriate placement. Purulent urine was aspirated from both renal collecting systems. Samples were sent for Gram  stain and culture. IMPRESSION: Bilateral 10 French nephrostomy drains placed utilizing fluoroscopic and ultrasound guidance. Electronically Signed   By: Miachel Roux M.D.   On: 10/08/2020 13:08   DG Chest Port 1 View  Result Date: 10/08/2020 CLINICAL DATA:  Pneumomediastinum. EXAM: PORTABLE CHEST 1 VIEW COMPARISON:  Chest CT dated 10/07/2020 and chest x-ray dated 10/07/2020. FINDINGS: Heart size and mediastinal contours are stable. Subtle evidence of mediastinum along the LEFT heart border. Lungs are clear. No pleural effusion or pneumothorax is seen. LEFT IJ central line is stable in position with tip at the level of the upper SVC. IMPRESSION: 1. Subtle evidence of pneumomediastinum along the LEFT heart border. There has certainly been no increase in the degree of pneumomediastinum compared to yesterday's exams. 2. Lungs are clear. No pleural effusion or pneumothorax seen. Electronically Signed   By: Franki Cabot M.D.   On: 10/08/2020 08:42   DG CHEST PORT 1 VIEW  Result Date: 10/07/2020 CLINICAL DATA:  Status post central line placement EXAM: PORTABLE CHEST 1 VIEW COMPARISON:  10/06/2020 FINDINGS: Cardiac shadow is stable. Left jugular temporary dialysis catheter is noted with the tip in the proximal superior vena cava. No pneumothorax is noted. Lungs are clear. No bony abnormality is noted. IMPRESSION: No pneumothorax following temporary dialysis catheter placement. Electronically Signed   By: Inez Catalina M.D.   On: 10/07/2020 19:51   DG Chest Portable 1 View  Result Date: 10/06/2020 CLINICAL DATA:  Altered mental status. EXAM: PORTABLE CHEST 1 VIEW COMPARISON:  Single-view of the chest 06/19/2020. FINDINGS: The patient is rotated on the study. Lungs clear. Heart size normal. No pneumothorax or pleural  fluid. No acute or focal bony abnormality. There is partial visualization of a double-J stent in the right kidney. Surgical clips right upper quadrant noted. IMPRESSION: No acute disease. Electronically Signed   By: Inge Rise M.D.   On: 10/06/2020 19:04   IR NEPHROSTOMY PLACEMENT LEFT  Result Date: 10/08/2020 INDICATION: 55 year old gentleman with worsening renal failure was found to have severe bilateral hydronephrosis despite bilateral ureteral stents in place. Interventional radiology consulted for bilateral percutaneous nephrostomy drain placement. EXAM: Bilateral nephrostomy drain placement utilizing ultrasound and fluoroscopic guidance COMPARISON:  None. MEDICATIONS: 2 g Rocephin IV; The antibiotic was administered in an appropriate time frame prior to skin puncture. ANESTHESIA/SEDATION: Fentanyl 50 mcg IV; Versed 2 mg IV Moderate Sedation Time:  32 minutes The patient was continuously monitored during the procedure by the interventional radiology nurse under my direct supervision. FLUOROSCOPY TIME:  Fluoroscopy Time: 1 minutes 36 seconds (3.5 mGy). COMPLICATIONS: None immediate. PROCEDURE: Informed written consent was obtained from the patient after a thorough discussion of the procedural risks, benefits and alternatives. All questions were addressed. Maximal Sterile Barrier Technique was utilized including caps, mask, sterile gowns, sterile gloves, sterile drape, hand hygiene and skin antiseptic. A timeout was performed prior to the initiation of the procedure. Bilateral flank skin prepped and draped in usual fashion. Sterile ultrasound probe cover and gel utilized throughout the procedure. Utilizing continuous ultrasound guidance, left midpole calyx was accessed with a 21 gauge needle. 21 gauge needle exchanged for transitional dilator set over 0.035 inch guidewire. Transitional dilator set exchanged for 10 French multipurpose pigtail drain over 0.035 inch guidewire. Drain secured to skin with  suture and connected to bag. Contrast administered through the drain under fluoroscopy opacified the left renal collecting system and confirmed appropriate placement. Utilizing continuous ultrasound guidance, right lower pole calyx was accessed with a 21 gauge needle. 21  gauge needle exchanged for transitional dilator set over 0.035 inch guidewire. Transitional dilator set exchanged for 10 French multipurpose pigtail drain over 0.035 inch guidewire. Drain secured to skin with suture and connected to bag. Contrast administered through the drain under fluoroscopy opacified the right renal collecting system and confirmed appropriate placement. Purulent urine was aspirated from both renal collecting systems. Samples were sent for Gram stain and culture. IMPRESSION: Bilateral 10 French nephrostomy drains placed utilizing fluoroscopic and ultrasound guidance. Electronically Signed   By: Miachel Roux M.D.   On: 10/08/2020 13:08   IR NEPHROSTOMY PLACEMENT RIGHT  Result Date: 10/08/2020 INDICATION: 55 year old gentleman with worsening renal failure was found to have severe bilateral hydronephrosis despite bilateral ureteral stents in place. Interventional radiology consulted for bilateral percutaneous nephrostomy drain placement. EXAM: Bilateral nephrostomy drain placement utilizing ultrasound and fluoroscopic guidance COMPARISON:  None. MEDICATIONS: 2 g Rocephin IV; The antibiotic was administered in an appropriate time frame prior to skin puncture. ANESTHESIA/SEDATION: Fentanyl 50 mcg IV; Versed 2 mg IV Moderate Sedation Time:  32 minutes The patient was continuously monitored during the procedure by the interventional radiology nurse under my direct supervision. FLUOROSCOPY TIME:  Fluoroscopy Time: 1 minutes 36 seconds (3.5 mGy). COMPLICATIONS: None immediate. PROCEDURE: Informed written consent was obtained from the patient after a thorough discussion of the procedural risks, benefits and alternatives. All questions  were addressed. Maximal Sterile Barrier Technique was utilized including caps, mask, sterile gowns, sterile gloves, sterile drape, hand hygiene and skin antiseptic. A timeout was performed prior to the initiation of the procedure. Bilateral flank skin prepped and draped in usual fashion. Sterile ultrasound probe cover and gel utilized throughout the procedure. Utilizing continuous ultrasound guidance, left midpole calyx was accessed with a 21 gauge needle. 21 gauge needle exchanged for transitional dilator set over 0.035 inch guidewire. Transitional dilator set exchanged for 10 French multipurpose pigtail drain over 0.035 inch guidewire. Drain secured to skin with suture and connected to bag. Contrast administered through the drain under fluoroscopy opacified the left renal collecting system and confirmed appropriate placement. Utilizing continuous ultrasound guidance, right lower pole calyx was accessed with a 21 gauge needle. 21 gauge needle exchanged for transitional dilator set over 0.035 inch guidewire. Transitional dilator set exchanged for 10 French multipurpose pigtail drain over 0.035 inch guidewire. Drain secured to skin with suture and connected to bag. Contrast administered through the drain under fluoroscopy opacified the right renal collecting system and confirmed appropriate placement. Purulent urine was aspirated from both renal collecting systems. Samples were sent for Gram stain and culture. IMPRESSION: Bilateral 10 French nephrostomy drains placed utilizing fluoroscopic and ultrasound guidance. Electronically Signed   By: Miachel Roux M.D.   On: 10/08/2020 13:08    Assessment/Plan:  1) Bilateral hydronephrosis despite stents with ARF- s/p bilateral nephrostomy placement.    2) BPH with Urinary retention, bilateral hydronephrosis with chronic Foley and stent - Dr. Alinda Money was planning TURP and stent change, but as above patient too frail for any procedure or general anesthetic right now.  Consider removing stents and foley and keep Nx long term.    I will notify Dr. Alinda Money of admission and pt status.     LOS: 2 days   Festus Aloe 10/08/2020, 2:43 PM

## 2020-10-08 NOTE — Procedures (Signed)
Interventional Radiology Procedure Note  Procedure: Bilateral 10 fr PCN drains   Indication: Bilateral hydronephrosis  Findings: Please refer to procedural dictation for full description.  Complications: None  EBL: < 10 mL  Miachel Roux, MD [phone]

## 2020-10-08 NOTE — Progress Notes (Signed)
[facility] Progress Note Patient Name: ISHAN SANROMAN DOB: 10/18/1965 MRN: [id number]   Date of Service  10/08/2020  HPI/Events of Note  Bair hugger requested.  eICU Interventions  Order entered.     Intervention Category Minor Interventions: Routine modifications to care plan (e.g. PRN medications for pain, fever)  Marily Lente Jadon Ressler 10/08/2020, 11:28 PM

## 2020-10-08 NOTE — Progress Notes (Signed)
Chronic foley exchanged at bedside this afternoon and patient now has little urine output from Foley. Of note patient now has bilateral nephrostomy tube for management of hydronephrosis. Urine will now be diverted from kidney directly to nephrostomy bag, urine output from foley should be minimal to none.   Gerardine Peltz D. Kenton Kingfisher, NP-[facility] Personal contact information can be found on Amion  10/08/2020, 6:12 PM

## 2020-10-08 NOTE — Progress Notes (Signed)
Echo attempted. Patient leaving for IR. Will attempt again later as schedule permits.

## 2020-10-08 NOTE — Progress Notes (Signed)
  Echocardiogram 2D Echocardiogram has been performed.  Chase [name] 10/08/2020, 3:03 PM

## 2020-10-09 ENCOUNTER — Encounter (HOSPITAL_COMMUNITY): Payer: Self-pay | Admitting: Family Medicine

## 2020-10-09 DIAGNOSIS — N184 Chronic kidney disease, stage 4 (severe): Secondary | ICD-10-CM | POA: Diagnosis not present

## 2020-10-09 DIAGNOSIS — Z515 Encounter for palliative care: Secondary | ICD-10-CM | POA: Diagnosis not present

## 2020-10-09 DIAGNOSIS — Z7189 Other specified counseling: Secondary | ICD-10-CM | POA: Diagnosis not present

## 2020-10-09 DIAGNOSIS — G9341 Metabolic encephalopathy: Secondary | ICD-10-CM | POA: Diagnosis not present

## 2020-10-09 DIAGNOSIS — N179 Acute kidney failure, unspecified: Secondary | ICD-10-CM | POA: Diagnosis not present

## 2020-10-09 LAB — RENAL FUNCTION PANEL
Albumin: 2.2 g/dL — ABNORMAL LOW (ref 3.5–5.0)
Albumin: 2 g/dL — ABNORMAL LOW (ref 3.5–5.0)
Anion gap: 9 (ref 5–15)
Anion gap: 7 (ref 5–15)
BUN: 17 mg/dL (ref 6–20)
BUN: 16 mg/dL (ref 6–20)
CO2: 25 mmol/L (ref 22–32)
CO2: 25 mmol/L (ref 22–32)
Calcium: 7.2 mg/dL — ABNORMAL LOW (ref 8.9–10.3)
Calcium: 6.9 mg/dL — ABNORMAL LOW (ref 8.9–10.3)
Chloride: 102 mmol/L (ref 98–111)
Chloride: 104 mmol/L (ref 98–111)
Creatinine, Ser: 2.17 mg/dL — ABNORMAL HIGH (ref 0.61–1.24)
Creatinine, Ser: 2.28 mg/dL — ABNORMAL HIGH (ref 0.61–1.24)
GFR, Estimated: 35 mL/min — ABNORMAL LOW (ref >60)
GFR, Estimated: 33 mL/min — ABNORMAL LOW (ref >60)
Glucose, Bld: 100 mg/dL — ABNORMAL HIGH (ref 70–99)
Glucose, Bld: 122 mg/dL — ABNORMAL HIGH (ref 70–99)
Phosphorus: 3.5 mg/dL (ref 2.5–4.6)
Phosphorus: 4 mg/dL (ref 2.5–4.6)
Potassium: 4.1 mmol/L (ref 3.5–5.1)
Potassium: 3.8 mmol/L (ref 3.5–5.1)
Sodium: 136 mmol/L (ref 135–145)
Sodium: 136 mmol/L (ref 135–145)

## 2020-10-09 LAB — CBC
HCT: 19.5 % — ABNORMAL LOW (ref 39.0–52.0)
Hemoglobin: 6.7 g/dL — CL (ref 13.0–17.0)
MCH: 31.6 pg (ref 26.0–34.0)
MCHC: 34.4 g/dL (ref 30.0–36.0)
MCV: 92 fL (ref 80.0–100.0)
Platelets: 137 10*3/uL — ABNORMAL LOW (ref 150–400)
RBC: 2.12 MIL/uL — ABNORMAL LOW (ref 4.22–5.81)
RDW: 15.3 % (ref 11.5–15.5)
WBC: 7.5 10*3/uL (ref 4.0–10.5)
nRBC: 0.5 % — ABNORMAL HIGH (ref 0.0–0.2)

## 2020-10-09 LAB — GLUCOSE, CAPILLARY: Glucose-Capillary: 121 mg/dL — ABNORMAL HIGH (ref 70–99)

## 2020-10-09 LAB — HEMOGLOBIN AND HEMATOCRIT, BLOOD
HCT: 20 % — ABNORMAL LOW (ref 39.0–52.0)
Hemoglobin: 6.6 g/dL — CL (ref 13.0–17.0)

## 2020-10-09 LAB — PREPARE RBC (CROSSMATCH)

## 2020-10-09 LAB — MAGNESIUM: Magnesium: 2.2 mg/dL (ref 1.7–2.4)

## 2020-10-09 MED ORDER — DARBEPOETIN ALFA 60 MCG/0.3ML IJ SOSY
60.0000 ug | PREFILLED_SYRINGE | INTRAMUSCULAR | Status: DC
  Filled 2020-10-09: qty 0.3

## 2020-10-09 MED ORDER — CHLORHEXIDINE GLUCONATE CLOTH 2 % EX PADS
6.0000 | MEDICATED_PAD | Freq: Every day | CUTANEOUS | Status: DC
  Administered 2020-10-09 – 2020-10-16 (×6): 6 via TOPICAL

## 2020-10-09 MED ORDER — SODIUM CHLORIDE 0.9% IV SOLUTION: Freq: Once | INTRAVENOUS | Status: AC

## 2020-10-09 MED ORDER — CALCIUM GLUCONATE-NACL 1-0.675 GM/50ML-% IV SOLN
1.0000 g | Freq: Once | INTRAVENOUS | Status: AC
  Administered 2020-10-09: 1000 mg via INTRAVENOUS
  Filled 2020-10-09: qty 50

## 2020-10-09 MED ORDER — CALCIUM CARBONATE ANTACID 500 MG PO CHEW
1.0000 | CHEWABLE_TABLET | Freq: Three times a day (TID) | ORAL | Status: DC
  Administered 2020-10-09 – 2020-10-13 (×10): 200 mg via ORAL
  Filled 2020-10-09 (×9): qty 1

## 2020-10-09 MED ORDER — HYDROCODONE-ACETAMINOPHEN 5-325 MG PO TABS
1.0000 | ORAL_TABLET | ORAL | Status: DC | PRN
  Administered 2020-10-09 – 2020-10-10 (×5): 2 via ORAL
  Administered 2020-10-10: 1 via ORAL
  Administered 2020-10-10 – 2020-10-18 (×34): 2 via ORAL
  Administered 2020-10-19: 1 via ORAL
  Administered 2020-10-19 – 2020-10-20 (×6): 2 via ORAL
  Filled 2020-10-09 (×48): qty 2

## 2020-10-09 MED ORDER — MIDODRINE HCL 5 MG PO TABS
10.0000 mg | ORAL_TABLET | ORAL | Status: DC
  Administered 2020-10-10 – 2020-10-19 (×4): 10 mg via ORAL
  Filled 2020-10-09 (×5): qty 2

## 2020-10-09 NOTE — Consult Note (Signed)
Consultation Note Date: 10/09/2020   Patient Name: Chase [name]  DOB: [birth year]  MRN: [id number]  Age / Sex: 55 y.o., male  PCP: [name] Referring Physician: Candee Furbish, MD  Reason for Consultation: Establishing goals of care and Psychosocial/spiritual support  HPI/Patient Profile: 55 y.o. male  with past medical history of anemia of renal disease, cervical radiculopathy, chronic pain syndrome with chronic back pain, CKD 4, history of COVID, GERD, unspecified GI bleed, HA, CAD with history of MI, history of CVA, history of gastric bypass/stomach ulcers, Hodgkin's lymphoma, low magnesium/potassium, PTSD, BPH, history of bilateral hydronephrosis and bilateral ureteral stents placed, history of urinary retention admitted on 10/06/2020 with acute on chronic renal failure, acute anemia, failure to thrive.   Clinical Assessment and Goals of Care: I have reviewed medical records including EPIC notes, labs and imaging, received report from RN, assessed the patient and then met to discuss diagnosis prognosis, GOC, EOL wishes, disposition and options.  [name] is lying quietly in bed.  He greets me making and mostly keeping eye contact.  He appears acutely/chronically ill and quite frail, cachectic.  He is alert and oriented, able to make his basic needs known.  There is no family at bedside at this time.  I introduced Palliative Medicine as specialized medical care for people living with serious illness. It focuses on providing relief from the symptoms and stress of a serious illness. The goal is to improve quality of life for both the patient and the family.  We discussed a brief life review of the patient.  [name] was married to his wife for 30 years before she passed away about 6 years ago.  They have 2 sons, 1 lives in [place], one in [place].  [name] served 6-1/2 years in the [employer] and has  [employer] benefits.  [name] has been living independently, managing his own ADLs.  He has 2 medium sized dogs at home.    We talked about his acute and chronic health concerns related to his urological and nephrology issues.  Overall, [name] is able to accurately recount his health concerns.  He is able to tell me an accurate timeline and treatment plan.  He tells me that he is agreeable for trial of HD tomorrow.  He shares that he is not sure if he will be able to tolerate dialysis.  [name] shares that he wants to try treatment options, he still has a desire to live.  He states his ultimate goal is to return to his own home.  Discussed the importance of continued conversation with family and the medical providers regarding overall plan of care and treatment options, ensuring decisions are within the context of the patient's values and GOCs.  Questions and concerns were addressed.   The family was encouraged to call with questions or concerns.  PMT will continue to support holistically.   HCPOA NEXT OF KIN -sons, Chase [name] and Chase [name]    SUMMARY OF RECOMMENDATIONS   At this  point continue to treat the treatable but no CPR or intubation.   Agreeable to a trial of HD. States that he would consider going to short-term rehab at a [facility]: DNR  Symptom Management:  Per hospitalist/CCM, no additional needs at this time.  Palliative Prophylaxis:  Frequent Pain Assessment, Oral Care, and Turn Reposition  Additional Recommendations (Limitations, Scope, Preferences): Continue to treat the treatable but no CPR or intubation  Psycho-social/Spiritual:  Desire for further Chaplaincy support:no Additional Recommendations: Caregiving  Support/Resources and Education on Hospice  Prognosis:  Unable to determine, based on outcomes.  Guarded at this point.  Discharge Planning:  To be determined, based on outcomes.  Agreeable to possible short-term rehab at [facility]  facility.       Primary Diagnoses: Present on Admission:  Acute renal failure superimposed on stage 4 chronic kidney disease (HCC)  Acute metabolic encephalopathy  Anemia in chronic kidney disease  Anxiety and depression  BPH (benign prostatic hyperplasia)  Postlaminectomy syndrome, not elsewhere classified  PTSD (post-traumatic stress disorder)  Acute renal failure (ARF) ([place])  AKI (acute kidney injury) ([place])   I have reviewed the medical record, interviewed the patient and family, and examined the patient. The following aspects are pertinent.  Past Medical History:  Diagnosis Date   Anemia    Cervical radiculopathy    with left sided weakness   Chemical exposure    service related injury   Chronic pain    CKD (chronic kidney disease), stage IV ([place]) 10/17/2019   COVID-19    Dyspnea    GERD (gastroesophageal reflux disease)    GIB (gastrointestinal bleeding)    Headache    Heart attack ([place])    History of CVA (cerebrovascular accident) 12/31/2019   History of gastric bypass 08/24/2019   History of stomach ulcers 05/19/2013   Formatting of this note might be different from the original. Attributed to chronic ibuprofen use   Hodgkin lymphoma, unspecified, unspecified site ([place]) 12/10/2012   Formatting of this note might be different from the original. In remission.   Hypokalemia    Hypomagnesemia    PTSD (post-traumatic stress disorder)    Renal disorder    CKD   Stroke [facility])    Urinary retention    due to bladder outlet obstruction   Social History   Socioeconomic History   Marital status: Widowed    Spouse name: Not on file   Number of children: Not on file   Years of education: Not on file   Highest education level: Not on file  Occupational History   Not on file  Tobacco Use   Smoking status: Never   Smokeless tobacco: Current  Vaping Use   Vaping Use: Never used  Substance and Sexual Activity   Alcohol use: Never   Drug use: Never   Sexual  activity: Not on file  Other Topics Concern   Not on file  Social History Narrative   Widow, Social research officer, government veteran served in [employer] gets care through the [place] as well   2 sons   Lives alone, his mother has been helping him in 2021 with illnesses   Former smoker no alcohol  or drug use now   Does use smokeless tobacco   2 caffeinated beverages daily   Social Determinants of Radio broadcast assistant Strain: Not on file  Food Insecurity: Not on file  Transportation Needs: Not on file  Physical Activity: Not on file  Stress: Not  on file  Social Connections: Not on file   Family History  Problem Relation Age of Onset   Renal cancer Father        mets to liver and lungs and pancreas   Heart disease Father    Heart disease Paternal Grandfather    Colon cancer Maternal Grandmother    Colon cancer Paternal Uncle    Stomach cancer Neg Hx    Esophageal cancer Neg Hx    Pancreatic cancer Neg Hx    Scheduled Meds:  calcium carbonate  1 tablet Oral TID WC   Chlorhexidine Gluconate Cloth  6 each Topical Daily   Chlorhexidine Gluconate Cloth  6 each Topical Q0600   darbepoetin (ARANESP) injection - DIALYSIS  60 mcg Intravenous Q Mon-HD   mouth rinse  15 mL Mouth Rinse BID   pantoprazole  40 mg Oral Q1200   sodium chloride flush  5 mL Intracatheter Q8H   Continuous Infusions:  sodium chloride Stopped (10/09/20 0825)   norepinephrine (LEVOPHED) Adult infusion Stopped (10/08/20 0401)   piperacillin-tazobactam (ZOSYN)  IV 3.375 g (10/09/20 1351)   PRN Meds:.atropine, fentaNYL (SUBLIMAZE) injection, heparin, HYDROcodone-acetaminophen, ondansetron Medications Prior to Admission:  Prior to Admission medications   Medication Sig Start Date End Date Taking? Authorizing Provider  cyanocobalamin (,VITAMIN B-12,) 1000 MCG/ML injection Inject 1,000 mcg into the muscle every 30 (thirty) days.  12/28/13  Yes [provider]  DULoxetine (CYMBALTA) 30 MG capsule Take 1 capsule (30 mg  total) by mouth 2 (two) times daily. 07/07/20 07/07/21 Yes Sheikh, Omair Latif, DO  hydrOXYzine (ATARAX/VISTARIL) 10 MG tablet Take 1 tablet (10 mg total) by mouth 3 (three) times daily as needed for itching or anxiety. 07/07/20  Yes Sheikh, Omair Latif, DO  Multiple Vitamin (MULTIVITAMIN WITH MINERALS) TABS tablet Take 1 tablet by mouth daily. 05/03/20  Yes Samella Parr, NP  pantoprazole (PROTONIX) 20 MG tablet TAKE 1 TABLET (20 MG TOTAL) BY MOUTH 2 (TWO) TIMES DAILY. Patient taking differently: Take 20 mg by mouth 2 (two) times daily. 05/03/20 05/03/21 Yes Samella Parr, NP  silodosin (RAPAFLO) 8 MG CAPS capsule Take 1 capsule (8 mg total) by mouth daily with breakfast. 07/07/20  Yes Sheikh, Omair Latif, DO  sodium bicarbonate 650 MG tablet TAKE 1 TABLET (650 MG TOTAL) BY MOUTH 2 (TWO) TIMES DAILY. Patient taking differently: Take 650 mg by mouth 2 (two) times daily. 05/03/20 05/03/21 Yes Samella Parr, NP  acetaminophen (TYLENOL) 500 MG tablet Take 1 tablet (500 mg total) by mouth every 6 (six) hours as needed for mild pain, fever or headache. Patient not taking: No sig reported 05/01/20   Samella Parr, NP  calcium carbonate (TUMS - DOSED IN MG ELEMENTAL CALCIUM) 500 MG chewable tablet Chew 1 tablet (200 mg of elemental calcium total) by mouth 3 (three) times daily. Patient not taking: No sig reported 05/01/20   Samella Parr, NP  feeding supplement (ENSURE ENLIVE / ENSURE PLUS) LIQD Take 237 mLs by mouth 2 (two) times daily between meals. Patient not taking: Reported on 10/07/2020 07/07/20   Raiford Noble Latif, DO  fentaNYL (DURAGESIC) 25 MCG/HR Place 1 patch onto the skin every 3 (three) days. Patient not taking: Reported on 10/07/2020 07/08/20   Raiford Noble Latif, DO  folic acid (FOLVITE) 1 MG tablet Take 1 tablet (1 mg total) by mouth daily. Patient not taking: Reported on 10/07/2020 07/08/20   Raiford Noble Latif, DO  gabapentin (NEURONTIN) 100 MG capsule TAKE 2 CAPSULES (200  MG TOTAL) BY MOUTH  DAILY. Patient not taking: Reported on 10/07/2020 05/03/20 05/03/21  Samella Parr, NP  gabapentin (NEURONTIN) 300 MG capsule TAKE 1 CAPSULE (300 MG TOTAL) BY MOUTH AT BEDTIME. Patient not taking: Reported on 10/07/2020 05/03/20 05/03/21  Samella Parr, NP  Nutritional Supplements (,FEEDING SUPPLEMENT, PROSOURCE PLUS) liquid Take 30 mLs by mouth 2 (two) times daily between meals. Patient not taking: Reported on 10/07/2020 07/07/20   Raiford Noble Latif, DO  polycarbophil (FIBERCON) 625 MG tablet Take 1 tablet (625 mg total) by mouth 2 (two) times daily. Patient not taking: Reported on 10/07/2020 05/01/20   Samella Parr, NP  polyethylene glycol (MIRALAX / GLYCOLAX) 17 g packet Take 17 g by mouth daily as needed for moderate constipation. Patient not taking: Reported on 10/07/2020 07/07/20   Raiford Noble Latif, DO  Vitamin D3 (VITAMIN D) 25 MCG tablet TAKE 800 UNITS BY MOUTH DAILY. Patient not taking: Reported on 10/07/2020 05/03/20 05/03/21  Samella Parr, NP   Allergies  Allergen Reactions   Nsaids Other (See Comments)    Bleeding GI Ulceration   Ciprofloxacin Nausea And Vomiting   Review of Systems  Unable to perform ROS: Acuity of condition   Physical Exam Vitals and nursing note reviewed.  Constitutional:      General: He is not in acute distress.    Appearance: He is ill-appearing.     Comments: Frail and thin  HENT:     Head: Normocephalic and atraumatic.     Mouth/Throat:     Mouth: Mucous membranes are moist.  Cardiovascular:     Rate and Rhythm: Normal rate.  Pulmonary:     Effort: Pulmonary effort is normal. No respiratory distress.  Abdominal:     General: Abdomen is flat.  Musculoskeletal:     Comments: Cachectic  Skin:    General: Skin is warm and dry.  Neurological:     Mental Status: He is alert and oriented to person, place, and time.  Psychiatric:        Mood and Affect: Mood normal.        Behavior: Behavior normal.    Vital Signs: BP (!) 90/58   Pulse (!) 58    Temp 99 F (37.2 C) (Oral)   Resp 14   Ht _0  (1.676 m)   Wt 51 kg   SpO2 100%   BMI 18.15 kg/m  Pain Scale: 0-10 POSS *See Group Information*: 1-Acceptable,Awake and alert Pain Score: 8    SpO2: SpO2: 100 % O2 Device:SpO2: 100 % O2 Flow Rate: .O2 Flow Rate (L/min): 2 L/min  IO: Intake/output summary:  Intake/Output Summary (Last 24 hours) at 10/09/2020 1431 Last data filed at 10/09/2020 1249 Gross per 24 hour  Intake 1255.67 ml  Output 314 ml  Net 941.67 ml    LBM: Last BM Date: 10/08/20 Baseline Weight: Weight: 65.7 kg Most recent weight: Weight: 51 kg     Palliative Assessment/Data:   Flowsheet Rows    Flowsheet Row Most Recent Value  Intake Tab   Referral Department Hospitalist  Unit at Time of Referral ICU  Palliative Care Primary Diagnosis Nephrology  Date Notified 10/07/20  Palliative Care Type Return patient Palliative Care  Reason for referral Clarify Goals of Care  Date of Admission 10/06/20  Date first seen by Palliative Care 10/09/20  # of days Palliative referral response time 2 Day(s)  # of days IP prior to Palliative referral 1  Clinical Assessment   Palliative Performance  Scale Score 20%  Pain Max last 24 hours Not able to report  Pain Min Last 24 hours Not able to report  Dyspnea Max Last 24 Hours Not able to report  Dyspnea Min Last 24 hours Not able to report  Psychosocial & Spiritual Assessment   Palliative Care Outcomes        Time In: 1400 Time Out: 1510 Time Total: 70 minutes  Greater than 50%  of this time was spent counseling and coordinating care related to the above assessment and plan.  Signed by: Drue Novel, NP   Please contact Palliative Medicine Team phone at [phone] for questions and concerns.  For individual provider: See Shea Evans

## 2020-10-09 NOTE — Progress Notes (Signed)
Patient ID: Chase [name], Chase [name]   DOB: [birth year], 55 y.o.   MRN: [id number]    Subjective: I have talked with Dr. Junious Silk and reviewed events/notes from the weekend.  Pt currently feeling well s/p bilateral nephrostomy tube placement yesterday.  No complaints of pain.  Has been receiving dialysis since hospital admission.  Objective: Vital signs in last 24 hours: Temp:  [97.6 F (36.4 C)-99 F (37.2 C)] 99 F (37.2 C) (07/11 1134) Pulse Rate:  [44-98] 58 (07/11 1245) Resp:  [10-20] 14 (07/11 1245) BP: (80-162)/(43-102) 90/58 (07/11 1245) SpO2:  [97 %-100 %] 100 % (07/11 1245) Weight:  [51 kg] 51 kg (07/11 0615)  Intake/Output from previous day: 07/10 0701 - 07/11 0700 In: 1512.2 [P.O.:60; I.V.:97.2; Blood:428; IV XTGGYIRSW:546] Out: 682 [Urine:849] Intake/Output this shift: Total I/O In: 291.4 [I.V.:73; Blood:195; IV Piggyback:23.4] Out: 105 [Urine:110]  Physical Exam:  General: Alert and oriented GU: Bilateral PCNs in place draining bloody urine but draining well.  Minimal output from Foley (replaced yesterday) since PCNs placed.  Lab Results: Recent Labs    10/08/20 0309 10/08/20 1511 10/09/20 0302  HGB 7.2* 9.2* 6.7*  HCT 20.9* 26.3* 19.5*   CBC Latest Ref Rng & Units 10/09/2020 10/08/2020 10/08/2020  WBC 4.0 - 10.5 K/uL 7.5 4.1 4.3  Hemoglobin 13.0 - 17.0 g/dL 6.7(LL) 9.2(L) 7.2(L)  Hematocrit 39.0 - 52.0 % 19.5(L) 26.3(L) 20.9(L)  Platelets 150 - 400 K/uL 137(L) 162 174     BMET Recent Labs    10/08/20 1516 10/09/20 0302  NA 137 136  K 3.7 4.1  CL 106 102  CO2 23 25  GLUCOSE 110* 100*  BUN 29* 17  CREATININE 3.09* 2.17*  CALCIUM 6.7* 7.2*     Studies/Results: DG Chest 1 View  Result Date: 10/08/2020 CLINICAL DATA:  Central line placement. EXAM: CHEST  1 VIEW COMPARISON:  10/08/2020 at 5:23 a.m. FINDINGS: Patient is slightly rotated to the right. Left IJ central venous catheter has been advanced as tip is over the SVC horizontally oriented toward  the lateral wall. Lungs are hypoinflated with persistent elevation of the right hemidiaphragm. No focal lobar consolidation, effusion or pneumothorax. Cardiomediastinal silhouette and remainder of the exam is unchanged. IMPRESSION: 1. Hypoinflation without acute cardiopulmonary disease. 2. Left IJ central venous catheter has been advanced with tip over the SVC horizontally oriented toward the lateral wall. Electronically Signed   By: Marin Olp M.D.   On: 10/08/2020 13:27   IR US Guidance  Result Date: 10/08/2020 INDICATION: 55 year old gentleman with worsening renal failure was found to have severe bilateral hydronephrosis despite bilateral ureteral stents in place. Interventional radiology consulted for bilateral percutaneous nephrostomy drain placement. EXAM: Bilateral nephrostomy drain placement utilizing ultrasound and fluoroscopic guidance COMPARISON:  None. MEDICATIONS: 2 g Rocephin IV; The antibiotic was administered in an appropriate time frame prior to skin puncture. ANESTHESIA/SEDATION: Fentanyl 50 mcg IV; Versed 2 mg IV Moderate Sedation Time:  32 minutes The patient was continuously monitored during the procedure by the interventional radiology nurse under my direct supervision. FLUOROSCOPY TIME:  Fluoroscopy Time: 1 minutes 36 seconds (3.5 mGy). COMPLICATIONS: None immediate. PROCEDURE: Informed written consent was obtained from the patient after a thorough discussion of the procedural risks, benefits and alternatives. All questions were addressed. Maximal Sterile Barrier Technique was utilized including caps, mask, sterile gowns, sterile gloves, sterile drape, hand hygiene and skin antiseptic. A timeout was performed prior to the initiation of the procedure. Bilateral flank skin prepped and draped in usual fashion. Sterile  ultrasound probe cover and gel utilized throughout the procedure. Utilizing continuous ultrasound guidance, left midpole calyx was accessed with a 21 gauge needle. 21 gauge  needle exchanged for transitional dilator set over 0.035 inch guidewire. Transitional dilator set exchanged for 10 French multipurpose pigtail drain over 0.035 inch guidewire. Drain secured to skin with suture and connected to bag. Contrast administered through the drain under fluoroscopy opacified the left renal collecting system and confirmed appropriate placement. Utilizing continuous ultrasound guidance, right lower pole calyx was accessed with a 21 gauge needle. 21 gauge needle exchanged for transitional dilator set over 0.035 inch guidewire. Transitional dilator set exchanged for 10 French multipurpose pigtail drain over 0.035 inch guidewire. Drain secured to skin with suture and connected to bag. Contrast administered through the drain under fluoroscopy opacified the right renal collecting system and confirmed appropriate placement. Purulent urine was aspirated from both renal collecting systems. Samples were sent for Gram stain and culture. IMPRESSION: Bilateral 10 French nephrostomy drains placed utilizing fluoroscopic and ultrasound guidance. Electronically Signed   By: Miachel Roux M.D.   On: 10/08/2020 13:08   IR US Guidance  Result Date: 10/08/2020 INDICATION: 55 year old gentleman with worsening renal failure was found to have severe bilateral hydronephrosis despite bilateral ureteral stents in place. Interventional radiology consulted for bilateral percutaneous nephrostomy drain placement. EXAM: Bilateral nephrostomy drain placement utilizing ultrasound and fluoroscopic guidance COMPARISON:  None. MEDICATIONS: 2 g Rocephin IV; The antibiotic was administered in an appropriate time frame prior to skin puncture. ANESTHESIA/SEDATION: Fentanyl 50 mcg IV; Versed 2 mg IV Moderate Sedation Time:  32 minutes The patient was continuously monitored during the procedure by the interventional radiology nurse under my direct supervision. FLUOROSCOPY TIME:  Fluoroscopy Time: 1 minutes 36 seconds (3.5 mGy).  COMPLICATIONS: None immediate. PROCEDURE: Informed written consent was obtained from the patient after a thorough discussion of the procedural risks, benefits and alternatives. All questions were addressed. Maximal Sterile Barrier Technique was utilized including caps, mask, sterile gowns, sterile gloves, sterile drape, hand hygiene and skin antiseptic. A timeout was performed prior to the initiation of the procedure. Bilateral flank skin prepped and draped in usual fashion. Sterile ultrasound probe cover and gel utilized throughout the procedure. Utilizing continuous ultrasound guidance, left midpole calyx was accessed with a 21 gauge needle. 21 gauge needle exchanged for transitional dilator set over 0.035 inch guidewire. Transitional dilator set exchanged for 10 French multipurpose pigtail drain over 0.035 inch guidewire. Drain secured to skin with suture and connected to bag. Contrast administered through the drain under fluoroscopy opacified the left renal collecting system and confirmed appropriate placement. Utilizing continuous ultrasound guidance, right lower pole calyx was accessed with a 21 gauge needle. 21 gauge needle exchanged for transitional dilator set over 0.035 inch guidewire. Transitional dilator set exchanged for 10 French multipurpose pigtail drain over 0.035 inch guidewire. Drain secured to skin with suture and connected to bag. Contrast administered through the drain under fluoroscopy opacified the right renal collecting system and confirmed appropriate placement. Purulent urine was aspirated from both renal collecting systems. Samples were sent for Gram stain and culture. IMPRESSION: Bilateral 10 French nephrostomy drains placed utilizing fluoroscopic and ultrasound guidance. Electronically Signed   By: Miachel Roux M.D.   On: 10/08/2020 13:08   DG Chest Port 1 View  Result Date: 10/08/2020 CLINICAL DATA:  Pneumomediastinum. EXAM: PORTABLE CHEST 1 VIEW COMPARISON:  Chest CT dated  10/07/2020 and chest x-ray dated 10/07/2020. FINDINGS: Heart size and mediastinal contours are stable. Subtle evidence of mediastinum  along the LEFT heart border. Lungs are clear. No pleural effusion or pneumothorax is seen. LEFT IJ central line is stable in position with tip at the level of the upper SVC. IMPRESSION: 1. Subtle evidence of pneumomediastinum along the LEFT heart border. There has certainly been no increase in the degree of pneumomediastinum compared to yesterday's exams. 2. Lungs are clear. No pleural effusion or pneumothorax seen. Electronically Signed   By: Franki Cabot M.D.   On: 10/08/2020 08:42   DG CHEST PORT 1 VIEW  Result Date: 10/07/2020 CLINICAL DATA:  Status post central line placement EXAM: PORTABLE CHEST 1 VIEW COMPARISON:  10/06/2020 FINDINGS: Cardiac shadow is stable. Left jugular temporary dialysis catheter is noted with the tip in the proximal superior vena cava. No pneumothorax is noted. Lungs are clear. No bony abnormality is noted. IMPRESSION: No pneumothorax following temporary dialysis catheter placement. Electronically Signed   By: Inez Catalina M.D.   On: 10/07/2020 19:51   IR NEPHROSTOMY PLACEMENT LEFT  Result Date: 10/08/2020 INDICATION: 55 year old gentleman with worsening renal failure was found to have severe bilateral hydronephrosis despite bilateral ureteral stents in place. Interventional radiology consulted for bilateral percutaneous nephrostomy drain placement. EXAM: Bilateral nephrostomy drain placement utilizing ultrasound and fluoroscopic guidance COMPARISON:  None. MEDICATIONS: 2 g Rocephin IV; The antibiotic was administered in an appropriate time frame prior to skin puncture. ANESTHESIA/SEDATION: Fentanyl 50 mcg IV; Versed 2 mg IV Moderate Sedation Time:  32 minutes The patient was continuously monitored during the procedure by the interventional radiology nurse under my direct supervision. FLUOROSCOPY TIME:  Fluoroscopy Time: 1 minutes 36 seconds (3.5  mGy). COMPLICATIONS: None immediate. PROCEDURE: Informed written consent was obtained from the patient after a thorough discussion of the procedural risks, benefits and alternatives. All questions were addressed. Maximal Sterile Barrier Technique was utilized including caps, mask, sterile gowns, sterile gloves, sterile drape, hand hygiene and skin antiseptic. A timeout was performed prior to the initiation of the procedure. Bilateral flank skin prepped and draped in usual fashion. Sterile ultrasound probe cover and gel utilized throughout the procedure. Utilizing continuous ultrasound guidance, left midpole calyx was accessed with a 21 gauge needle. 21 gauge needle exchanged for transitional dilator set over 0.035 inch guidewire. Transitional dilator set exchanged for 10 French multipurpose pigtail drain over 0.035 inch guidewire. Drain secured to skin with suture and connected to bag. Contrast administered through the drain under fluoroscopy opacified the left renal collecting system and confirmed appropriate placement. Utilizing continuous ultrasound guidance, right lower pole calyx was accessed with a 21 gauge needle. 21 gauge needle exchanged for transitional dilator set over 0.035 inch guidewire. Transitional dilator set exchanged for 10 French multipurpose pigtail drain over 0.035 inch guidewire. Drain secured to skin with suture and connected to bag. Contrast administered through the drain under fluoroscopy opacified the right renal collecting system and confirmed appropriate placement. Purulent urine was aspirated from both renal collecting systems. Samples were sent for Gram stain and culture. IMPRESSION: Bilateral 10 French nephrostomy drains placed utilizing fluoroscopic and ultrasound guidance. Electronically Signed   By: Miachel Roux M.D.   On: 10/08/2020 13:08   IR NEPHROSTOMY PLACEMENT RIGHT  Result Date: 10/08/2020 INDICATION: 55 year old gentleman with worsening renal failure was found to have  severe bilateral hydronephrosis despite bilateral ureteral stents in place. Interventional radiology consulted for bilateral percutaneous nephrostomy drain placement. EXAM: Bilateral nephrostomy drain placement utilizing ultrasound and fluoroscopic guidance COMPARISON:  None. MEDICATIONS: 2 g Rocephin IV; The antibiotic was administered in an appropriate time frame  prior to skin puncture. ANESTHESIA/SEDATION: Fentanyl 50 mcg IV; Versed 2 mg IV Moderate Sedation Time:  32 minutes The patient was continuously monitored during the procedure by the interventional radiology nurse under my direct supervision. FLUOROSCOPY TIME:  Fluoroscopy Time: 1 minutes 36 seconds (3.5 mGy). COMPLICATIONS: None immediate. PROCEDURE: Informed written consent was obtained from the patient after a thorough discussion of the procedural risks, benefits and alternatives. All questions were addressed. Maximal Sterile Barrier Technique was utilized including caps, mask, sterile gowns, sterile gloves, sterile drape, hand hygiene and skin antiseptic. A timeout was performed prior to the initiation of the procedure. Bilateral flank skin prepped and draped in usual fashion. Sterile ultrasound probe cover and gel utilized throughout the procedure. Utilizing continuous ultrasound guidance, left midpole calyx was accessed with a 21 gauge needle. 21 gauge needle exchanged for transitional dilator set over 0.035 inch guidewire. Transitional dilator set exchanged for 10 French multipurpose pigtail drain over 0.035 inch guidewire. Drain secured to skin with suture and connected to bag. Contrast administered through the drain under fluoroscopy opacified the left renal collecting system and confirmed appropriate placement. Utilizing continuous ultrasound guidance, right lower pole calyx was accessed with a 21 gauge needle. 21 gauge needle exchanged for transitional dilator set over 0.035 inch guidewire. Transitional dilator set exchanged for 10 French  multipurpose pigtail drain over 0.035 inch guidewire. Drain secured to skin with suture and connected to bag. Contrast administered through the drain under fluoroscopy opacified the right renal collecting system and confirmed appropriate placement. Purulent urine was aspirated from both renal collecting systems. Samples were sent for Gram stain and culture. IMPRESSION: Bilateral 10 French nephrostomy drains placed utilizing fluoroscopic and ultrasound guidance. Electronically Signed   By: Miachel Roux M.D.   On: 10/08/2020 13:08   ECHOCARDIOGRAM LIMITED  Result Date: 10/08/2020    ECHOCARDIOGRAM LIMITED REPORT   Patient Name:   Chase [name] Date of Exam: 10/08/2020 Medical Rec #:  [id number]       Height:       66.0 in Accession #:    [id number]      Weight:       119.3 lb Date of Birth:  1965/10/01       BSA:          1.605 m Patient Age:    7 years        BP:           126/66 mmHg Patient Gender: M               HR:           61 bpm. Exam Location:  Inpatient Procedure: Limited Echo Indications:    Abnormal ECG  History:        Patient has prior history of Echocardiogram examinations, most                 recent 06/28/2020. Previous Myocardial Infarction. H/O stroke and                 COVID-19. CKD. GERD. Cancer.  Sonographer:    Clayton Lefort RDCS (AE) Referring Phys: [phone] [facility] D HARRIS  Sonographer Comments: Unable to find parasternal or apical imaging windows. Subcostal area covered with ostomy bag and bandages.  Uninterpretable study. Sanda Klein MD Electronically signed by Sanda Klein MD Signature Date/Time: 10/08/2020/3:10:26 PM    Final     Assessment/Plan:   Chase [name] is well known to me.  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  Due to his chronic medical problems, he never made it to surgery with [name].  However, he was restudied in January and noted to have spontaneous resolution of his fistula.  In addition, he was noted to have bilateral hydronephrosis with AKI/CKD and probably bladder outlet obstruction.  The etiology of his hydronephrosis has not been determined and was initially felt to be related to his pelvic abscess associated with his fistula on admission.  He had indwelling ureteral stents placed. He has had multiple hospital admissions since his initial presentation for acute medical problems including recurrent episodes of sepsis felt to be from a GU source but possibly persistent ureteral obstruction.  Bilateral nephrostomy tubes were placed last fall with a new baseline Cr of around 4-4.5.  During his hospitalization in January, we were able to sequentially clamp his nephrostomy tubes with no change in his renal function and his nephrostomy tubes were able to be removed.  He had another episode of AKI and sepsis in April which ultimately resolved and his renal function returned to baseline.  I evaluated him last in May and his performance status was improved.  Urodynamic studies have indicated preserved detrusor function with evidence of bladder outlet obstruction and he agreed to proceed with TURP and bilateral ureteral stent exchange in early July with plans to see if we could get rid of his catheter finally.  He did not present for his pre-op labs nor his surgery last Thursday.  Attempts to reach him resulted in his son notifying welfare for a check and he was found unresponsive.  He last remembers being coherent a week ago Monday when he did get transportation for his COVID test in preparation for surgery.  He denies poor hydration or appetite.  He denies fever or pain.  He was found to be with severe AKI with Cr over 11 on admission possibly prerenal due to being non-responsive with poor hydration for multiple days. Currently, it is unclear what happened.    1) Bilateral hydronephrosis: Unclear etiology.  His stents were last changed in April but he currently has bilateral nephrostomy  tubes in place for optimal drainage and treatment of any obstructive component of his AKI.  Will need to see where his renal function eventually settles out after yet another episode of AKI.  Will then discuss options going forward.  Pending his recovery from his current acute illness, will determine if he would be a candidate to return to the OR for further evaluation of the etiology of his hydronephrosis/ongoing ureteral stent management.  2) Bladder outlet obstruction:  He was scheduled for TURP last week.  This will now need to delayed.  His catheter was changed on 10/08/20.  When/if he becomes medically optimized, can reconsider this options to get him free of an indwelling catheter.  Do not anticipate that this will be soon.  Will likely have him follow up for catheter change in a month and re-evaluate surgical options at that time.  3) Pneumomediastinum: Not sure of this significance or cause for this.  Would likely need some definitive recommendations about this before considering any elective urologic procedure in the future.  4) Urologic disposition: Much of the decisions/goals of care from a urologic perspective will depend on both his renal function and urine output after his acute illness.  In addition, he will need to be medically optimized before considering any elective procedure.  He  will need his stents changed or removed at some point over the next 4-6 weeks.  I did express to Chase [name] that, despite his prior objections, he would likely be best served by being discharged to a SNF or at least an assisted living facility to avoid recurrent hospitalizations and setbacks for acute illnesses that might have been avoided with more supportive care and assistance.   I will continue to follow him.    LOS: 3 days   [name] 10/09/2020, 1:45 PM

## 2020-10-09 NOTE — Progress Notes (Signed)
[facility] Progress Note Patient Name: Chase [name] DOB: 1965-12-25 MRN: [id number]   Date of Service  10/09/2020  HPI/Events of Note  Hgb 6.7 down from 9.2. Has bilateral nephrostomy tubes in place. Both have serosanguinous output.  Platelets 137. Coags wnl. Hemodynamically stable.   eICU Interventions  Transfuse 1u pRBCs. Repeat Hgb at 1300 hrs.     Intervention Category Intermediate Interventions: Bleeding - evaluation and treatment with blood products  Chase [name] 10/09/2020, 4:14 AM

## 2020-10-09 NOTE — Progress Notes (Addendum)
Referring Physician(s): Earney Mallet NP  Supervising Physician: Corrie Mckusick  Patient Status:  [facility] - In-pt  Chief Complaint:  Bilateral nephrostomy tube and AKI in the presence of Double J stents. IR placed bilateral nephrostomy tubes on 7.10.22 by Dr. Dwaine Gale  Subjective:  Patient denies any issues at this time. Laying in bed eating a sandwich. He has previously had nephrostomy tubes and states he is familiar with them and " I just think of it as another procedure" Patient does states he hopes "to go back to the stents at some time in the future"  Allergies: Nsaids and Ciprofloxacin  Medications: Prior to Admission medications   Medication Sig Start Date End Date Taking? Authorizing Provider  cyanocobalamin (,VITAMIN B-12,) 1000 MCG/ML injection Inject 1,000 mcg into the muscle every 30 (thirty) days.  12/28/13  Yes [provider]  DULoxetine (CYMBALTA) 30 MG capsule Take 1 capsule (30 mg total) by mouth 2 (two) times daily. 07/07/20 07/07/21 Yes Sheikh, Omair Latif, DO  hydrOXYzine (ATARAX/VISTARIL) 10 MG tablet Take 1 tablet (10 mg total) by mouth 3 (three) times daily as needed for itching or anxiety. 07/07/20  Yes Sheikh, Omair Latif, DO  Multiple Vitamin (MULTIVITAMIN WITH MINERALS) TABS tablet Take 1 tablet by mouth daily. 05/03/20  Yes Samella Parr, NP  pantoprazole (PROTONIX) 20 MG tablet TAKE 1 TABLET (20 MG TOTAL) BY MOUTH 2 (TWO) TIMES DAILY. Patient taking differently: Take 20 mg by mouth 2 (two) times daily. 05/03/20 05/03/21 Yes Samella Parr, NP  silodosin (RAPAFLO) 8 MG CAPS capsule Take 1 capsule (8 mg total) by mouth daily with breakfast. 07/07/20  Yes Sheikh, Omair Latif, DO  sodium bicarbonate 650 MG tablet TAKE 1 TABLET (650 MG TOTAL) BY MOUTH 2 (TWO) TIMES DAILY. Patient taking differently: Take 650 mg by mouth 2 (two) times daily. 05/03/20 05/03/21 Yes Samella Parr, NP  acetaminophen (TYLENOL) 500 MG tablet Take 1 tablet (500 mg total) by mouth every 6 (six)  hours as needed for mild pain, fever or headache. Patient not taking: No sig reported 05/01/20   Samella Parr, NP  calcium carbonate (TUMS - DOSED IN MG ELEMENTAL CALCIUM) 500 MG chewable tablet Chew 1 tablet (200 mg of elemental calcium total) by mouth 3 (three) times daily. Patient not taking: No sig reported 05/01/20   Samella Parr, NP  feeding supplement (ENSURE ENLIVE / ENSURE PLUS) LIQD Take 237 mLs by mouth 2 (two) times daily between meals. Patient not taking: Reported on 10/07/2020 07/07/20   Raiford Noble Latif, DO  fentaNYL (DURAGESIC) 25 MCG/HR Place 1 patch onto the skin every 3 (three) days. Patient not taking: Reported on 10/07/2020 07/08/20   Raiford Noble Latif, DO  folic acid (FOLVITE) 1 MG tablet Take 1 tablet (1 mg total) by mouth daily. Patient not taking: Reported on 10/07/2020 07/08/20   Raiford Noble Latif, DO  gabapentin (NEURONTIN) 100 MG capsule TAKE 2 CAPSULES (200 MG TOTAL) BY MOUTH DAILY. Patient not taking: Reported on 10/07/2020 05/03/20 05/03/21  Samella Parr, NP  gabapentin (NEURONTIN) 300 MG capsule TAKE 1 CAPSULE (300 MG TOTAL) BY MOUTH AT BEDTIME. Patient not taking: Reported on 10/07/2020 05/03/20 05/03/21  Samella Parr, NP  Nutritional Supplements (,FEEDING SUPPLEMENT, PROSOURCE PLUS) liquid Take 30 mLs by mouth 2 (two) times daily between meals. Patient not taking: Reported on 10/07/2020 07/07/20   Raiford Noble Latif, DO  polycarbophil (FIBERCON) 625 MG tablet Take 1 tablet (625 mg total) by mouth 2 (  two) times daily. Patient not taking: Reported on 10/07/2020 05/01/20   Samella Parr, NP  polyethylene glycol (MIRALAX / GLYCOLAX) 17 g packet Take 17 g by mouth daily as needed for moderate constipation. Patient not taking: Reported on 10/07/2020 07/07/20   Raiford Noble Latif, DO  Vitamin D3 (VITAMIN D) 25 MCG tablet TAKE 800 UNITS BY MOUTH DAILY. Patient not taking: Reported on 10/07/2020 05/03/20 05/03/21  Samella Parr, NP     Vital Signs: BP (!) 90/58   Pulse (!) 58    Temp 99 F (37.2 C) (Oral)   Resp 14   Ht 5\' 6"  (1.676 m)   Wt 112 lb 7 oz (51 kg)   SpO2 100%   BMI 18.15 kg/m   Physical Exam Vitals and nursing note reviewed.  Constitutional:      Appearance: He is well-developed. He is ill-appearing.  HENT:     Head: Normocephalic.  Cardiovascular:     Rate and Rhythm: Normal rate and regular rhythm.  Pulmonary:     Effort: Pulmonary effort is normal.  Genitourinary:    Comments: been removed. BUN down trending 17, Cr down trending 2.17, GFR 35. Hgb 6.7.  Positive bilateral nephrostomy tubes to  gravity bag.  Minimal amount of dark red fluid noted to be in the bilateral neophtrostomy tubes. Per RN at bedside nephrostomy tubes are flushed easily. Musculoskeletal:        General: Normal range of motion.     Cervical back: Normal range of motion.  Skin:    General: Skin is dry.  Neurological:     Mental Status: He is alert and oriented to person, place, and time.    Imaging: CT ABDOMEN PELVIS WO CONTRAST  Result Date: 10/07/2020 CLINICAL DATA:  Renal failure. Altered mental status. Scheduled for TURP. EXAM: CT ABDOMEN AND PELVIS WITHOUT CONTRAST TECHNIQUE: Multidetector CT imaging of the abdomen and pelvis was performed following the standard protocol without IV contrast. COMPARISON:  CT abdomen dated 06/27/2020. FINDINGS: Lower chest: Pneumomediastinum, incompletely imaged. Lung bases are clear. Hepatobiliary: Status post cholecystectomy. No focal liver abnormality is identified. Pancreas: Unremarkable. No pancreatic ductal dilatation or surrounding inflammatory changes. Spleen: Normal in size without focal abnormality. Adrenals/Urinary Tract: LEFT adrenal fullness without discrete mass. RIGHT adrenal gland is unremarkable. Bilateral hydronephrosis, moderate-to-severe in degree, despite nephroureteral stents which appear appropriately positioned. Bladder is partially decompressed by Foley catheter. Stomach/Bowel: Status post gastric surgery.  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 No dilated large or small bowel loops are identified. Vascular/Lymphatic: Aortic atherosclerosis. No abdominal aortic aneurysm. No enlarged lymph nodes are seen. Reproductive: Stable prostate enlargement. Other: No free fluid or abscess collection is identified. No free intraperitoneal air. Musculoskeletal: No acute or suspicious osseous abnormality. Mild degenerative spondylosis of the thoracolumbar spine. IMPRESSION: 1. Pneumomediastinum, incompletely imaged. Recommend chest CT for complete characterization. 2. Bilateral hydronephrosis, moderate-to-severe in degree, despite nephroureteral stents which appear appropriately positioned, indicating bladder outlet obstruction. 3. 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 No bowel obstruction. 4. Stable prostate enlargement. Aortic Atherosclerosis (ICD10-I70.0). Critical Value/emergent results were called by telephone at the time of interpretation on 10/07/2020 at 5:21 am to provider Mesner, who verbally acknowledged these results. Electronically Signed   By: Franki Cabot M.D.   On: 10/07/2020 05:22   DG Chest 1 View  Result Date: 10/08/2020 CLINICAL DATA:  Central line placement. EXAM: CHEST  1 VIEW COMPARISON:  10/08/2020 at 5:23 a.m. FINDINGS: Patient is slightly rotated  to the right. Left IJ central venous catheter has been advanced as tip is over the SVC horizontally oriented toward the lateral wall. Lungs are hypoinflated with persistent elevation of the right hemidiaphragm. No focal lobar consolidation, effusion or pneumothorax. Cardiomediastinal silhouette and remainder of the exam is unchanged. IMPRESSION: 1. Hypoinflation without acute cardiopulmonary disease. 2. Left IJ central venous catheter has been advanced with tip over the SVC horizontally oriented toward the lateral wall. Electronically Signed   By: Marin Olp M.D.   On: 10/08/2020 13:27    CT CHEST WO CONTRAST  Result Date: 10/07/2020 CLINICAL DATA:  Abnormal x-ray. Pneumomediastinum seen on today's earlier CT abdomen. EXAM: CT CHEST WITHOUT CONTRAST TECHNIQUE: Multidetector CT imaging of the chest was performed following the standard protocol without IV contrast. COMPARISON:  CT abdomen from earlier same day. FINDINGS: Cardiovascular: No thoracic aortic aneurysm. No pericardial effusion. Mediastinum/Nodes: Pneumomediastinum. Trachea and central bronchi are unremarkable. No mass or enlarged lymph nodes are seen within the mediastinum. Small hiatal hernia. Surgical anastomosis at the underlying gastroesophageal junction. Lungs/Pleura: Lungs are clear. No pleural effusion or pneumothorax is seen. Upper Abdomen: Surgical changes about the stomach and at the gastroesophageal junction. Severe bilateral hydronephrosis, incompletely imaged. No free intraperitoneal air is seen within the upper abdomen. Musculoskeletal: No chest wall mass or suspicious bone lesions identified. IMPRESSION: 1. Pneumomediastinum. The source of the pneumomediastinum is not clearly identified. One possibility would be dehiscence or erosion at the surgical anastomosis seen at the gastroesophageal junction. Differential considerations also include increased intrathoracic pressure related to recent coughing or vomiting, if clinical history correlates. 2. Lungs are clear. 3. Small hiatal hernia. 4. Severe bilateral hydronephrosis, incompletely imaged. This was characterized on today's earlier CT abdomen and pelvis. Electronically Signed   By: Franki Cabot M.D.   On: 10/07/2020 09:39   IR US Guidance  Result Date: 10/08/2020 INDICATION: 54 year old gentleman with worsening renal failure was found to have severe bilateral hydronephrosis despite bilateral ureteral stents in place. Interventional radiology consulted for bilateral percutaneous nephrostomy drain placement. EXAM: Bilateral nephrostomy drain placement utilizing  ultrasound and fluoroscopic guidance COMPARISON:  None. MEDICATIONS: 2 g Rocephin IV; The antibiotic was administered in an appropriate time frame prior to skin puncture. ANESTHESIA/SEDATION: Fentanyl 50 mcg IV; Versed 2 mg IV Moderate Sedation Time:  32 minutes The patient was continuously monitored during the procedure by the interventional radiology nurse under my direct supervision. FLUOROSCOPY TIME:  Fluoroscopy Time: 1 minutes 36 seconds (3.5 mGy). COMPLICATIONS: None immediate. PROCEDURE: Informed written consent was obtained from the patient after a thorough discussion of the procedural risks, benefits and alternatives. All questions were addressed. Maximal Sterile Barrier Technique was utilized including caps, mask, sterile gowns, sterile gloves, sterile drape, hand hygiene and skin antiseptic. A timeout was performed prior to the initiation of the procedure. Bilateral flank skin prepped and draped in usual fashion. Sterile ultrasound probe cover and gel utilized throughout the procedure. Utilizing continuous ultrasound guidance, left midpole calyx was accessed with a 21 gauge needle. 21 gauge needle exchanged for transitional dilator set over 0.035 inch guidewire. Transitional dilator set exchanged for 10 French multipurpose pigtail drain over 0.035 inch guidewire. Drain secured to skin with suture and connected to bag. Contrast administered through the drain under fluoroscopy opacified the left renal collecting system and confirmed appropriate placement. Utilizing continuous ultrasound guidance, right lower pole calyx was accessed with a 21 gauge needle. 21 gauge needle exchanged for transitional dilator set over 0.035 inch guidewire. Transitional dilator set exchanged for  10 [place] multipurpose pigtail drain over 0.035 inch guidewire. Drain secured to skin with suture and connected to bag. Contrast administered through the drain under fluoroscopy opacified the right renal collecting system and confirmed  appropriate placement. Purulent urine was aspirated from both renal collecting systems. Samples were sent for Gram stain and culture. IMPRESSION: Bilateral 10 French nephrostomy drains placed utilizing fluoroscopic and ultrasound guidance. Electronically Signed   By: Miachel Roux M.D.   On: 10/08/2020 13:08   IR US Guidance  Result Date: 10/08/2020 INDICATION: 55 year old gentleman with worsening renal failure was found to have severe bilateral hydronephrosis despite bilateral ureteral stents in place. Interventional radiology consulted for bilateral percutaneous nephrostomy drain placement. EXAM: Bilateral nephrostomy drain placement utilizing ultrasound and fluoroscopic guidance COMPARISON:  None. MEDICATIONS: 2 g Rocephin IV; The antibiotic was administered in an appropriate time frame prior to skin puncture. ANESTHESIA/SEDATION: Fentanyl 50 mcg IV; Versed 2 mg IV Moderate Sedation Time:  32 minutes The patient was continuously monitored during the procedure by the interventional radiology nurse under my direct supervision. FLUOROSCOPY TIME:  Fluoroscopy Time: 1 minutes 36 seconds (3.5 mGy). COMPLICATIONS: None immediate. PROCEDURE: Informed written consent was obtained from the patient after a thorough discussion of the procedural risks, benefits and alternatives. All questions were addressed. Maximal Sterile Barrier Technique was utilized including caps, mask, sterile gowns, sterile gloves, sterile drape, hand hygiene and skin antiseptic. A timeout was performed prior to the initiation of the procedure. Bilateral flank skin prepped and draped in usual fashion. Sterile ultrasound probe cover and gel utilized throughout the procedure. Utilizing continuous ultrasound guidance, left midpole calyx was accessed with a 21 gauge needle. 21 gauge needle exchanged for transitional dilator set over 0.035 inch guidewire. Transitional dilator set exchanged for 10 French multipurpose pigtail drain over 0.035 inch  guidewire. Drain secured to skin with suture and connected to bag. Contrast administered through the drain under fluoroscopy opacified the left renal collecting system and confirmed appropriate placement. Utilizing continuous ultrasound guidance, right lower pole calyx was accessed with a 21 gauge needle. 21 gauge needle exchanged for transitional dilator set over 0.035 inch guidewire. Transitional dilator set exchanged for 10 French multipurpose pigtail drain over 0.035 inch guidewire. Drain secured to skin with suture and connected to bag. Contrast administered through the drain under fluoroscopy opacified the right renal collecting system and confirmed appropriate placement. Purulent urine was aspirated from both renal collecting systems. Samples were sent for Gram stain and culture. IMPRESSION: Bilateral 10 French nephrostomy drains placed utilizing fluoroscopic and ultrasound guidance. Electronically Signed   By: Miachel Roux M.D.   On: 10/08/2020 13:08   DG Chest Port 1 View  Result Date: 10/08/2020 CLINICAL DATA:  Pneumomediastinum. EXAM: PORTABLE CHEST 1 VIEW COMPARISON:  Chest CT dated 10/07/2020 and chest x-ray dated 10/07/2020. FINDINGS: Heart size and mediastinal contours are stable. Subtle evidence of mediastinum along the LEFT heart border. Lungs are clear. No pleural effusion or pneumothorax is seen. LEFT IJ central line is stable in position with tip at the level of the upper SVC. IMPRESSION: 1. Subtle evidence of pneumomediastinum along the LEFT heart border. There has certainly been no increase in the degree of pneumomediastinum compared to yesterday's exams. 2. Lungs are clear. No pleural effusion or pneumothorax seen. Electronically Signed   By: Franki Cabot M.D.   On: 10/08/2020 08:42   DG CHEST PORT 1 VIEW  Result Date: 10/07/2020 CLINICAL DATA:  Status post central line placement EXAM: PORTABLE CHEST 1 VIEW COMPARISON:  10/06/2020  FINDINGS: Cardiac shadow is stable. Left jugular  temporary dialysis catheter is noted with the tip in the proximal superior vena cava. No pneumothorax is noted. Lungs are clear. No bony abnormality is noted. IMPRESSION: No pneumothorax following temporary dialysis catheter placement. Electronically Signed   By: Inez Catalina M.D.   On: 10/07/2020 19:51   DG Chest Portable 1 View  Result Date: 10/06/2020 CLINICAL DATA:  Altered mental status. EXAM: PORTABLE CHEST 1 VIEW COMPARISON:  Single-view of the chest 06/19/2020. FINDINGS: The patient is rotated on the study. Lungs clear. Heart size normal. No pneumothorax or pleural fluid. No acute or focal bony abnormality. There is partial visualization of a double-J stent in the right kidney. Surgical clips right upper quadrant noted. IMPRESSION: No acute disease. Electronically Signed   By: Inge Rise M.D.   On: 10/06/2020 19:04   IR NEPHROSTOMY PLACEMENT LEFT  Result Date: 10/08/2020 INDICATION: 55 year old gentleman with worsening renal failure was found to have severe bilateral hydronephrosis despite bilateral ureteral stents in place. Interventional radiology consulted for bilateral percutaneous nephrostomy drain placement. EXAM: Bilateral nephrostomy drain placement utilizing ultrasound and fluoroscopic guidance COMPARISON:  None. MEDICATIONS: 2 g Rocephin IV; The antibiotic was administered in an appropriate time frame prior to skin puncture. ANESTHESIA/SEDATION: Fentanyl 50 mcg IV; Versed 2 mg IV Moderate Sedation Time:  32 minutes The patient was continuously monitored during the procedure by the interventional radiology nurse under my direct supervision. FLUOROSCOPY TIME:  Fluoroscopy Time: 1 minutes 36 seconds (3.5 mGy). COMPLICATIONS: None immediate. PROCEDURE: Informed written consent was obtained from the patient after a thorough discussion of the procedural risks, benefits and alternatives. All questions were addressed. Maximal Sterile Barrier Technique was utilized including caps, mask, sterile  gowns, sterile gloves, sterile drape, hand hygiene and skin antiseptic. A timeout was performed prior to the initiation of the procedure. Bilateral flank skin prepped and draped in usual fashion. Sterile ultrasound probe cover and gel utilized throughout the procedure. Utilizing continuous ultrasound guidance, left midpole calyx was accessed with a 21 gauge needle. 21 gauge needle exchanged for transitional dilator set over 0.035 inch guidewire. Transitional dilator set exchanged for 10 French multipurpose pigtail drain over 0.035 inch guidewire. Drain secured to skin with suture and connected to bag. Contrast administered through the drain under fluoroscopy opacified the left renal collecting system and confirmed appropriate placement. Utilizing continuous ultrasound guidance, right lower pole calyx was accessed with a 21 gauge needle. 21 gauge needle exchanged for transitional dilator set over 0.035 inch guidewire. Transitional dilator set exchanged for 10 French multipurpose pigtail drain over 0.035 inch guidewire. Drain secured to skin with suture and connected to bag. Contrast administered through the drain under fluoroscopy opacified the right renal collecting system and confirmed appropriate placement. Purulent urine was aspirated from both renal collecting systems. Samples were sent for Gram stain and culture. IMPRESSION: Bilateral 10 French nephrostomy drains placed utilizing fluoroscopic and ultrasound guidance. Electronically Signed   By: Miachel Roux M.D.   On: 10/08/2020 13:08   IR NEPHROSTOMY PLACEMENT RIGHT  Result Date: 10/08/2020 INDICATION: 55 year old gentleman with worsening renal failure was found to have severe bilateral hydronephrosis despite bilateral ureteral stents in place. Interventional radiology consulted for bilateral percutaneous nephrostomy drain placement. EXAM: Bilateral nephrostomy drain placement utilizing ultrasound and fluoroscopic guidance COMPARISON:  None. MEDICATIONS: 2  g Rocephin IV; The antibiotic was administered in an appropriate time frame prior to skin puncture. ANESTHESIA/SEDATION: Fentanyl 50 mcg IV; Versed 2 mg IV Moderate Sedation Time:  32 minutes  The patient was continuously monitored during the procedure by the interventional radiology nurse under my direct supervision. FLUOROSCOPY TIME:  Fluoroscopy Time: 1 minutes 36 seconds (3.5 mGy). COMPLICATIONS: None immediate. PROCEDURE: Informed written consent was obtained from the patient after a thorough discussion of the procedural risks, benefits and alternatives. All questions were addressed. Maximal Sterile Barrier Technique was utilized including caps, mask, sterile gowns, sterile gloves, sterile drape, hand hygiene and skin antiseptic. A timeout was performed prior to the initiation of the procedure. Bilateral flank skin prepped and draped in usual fashion. Sterile ultrasound probe cover and gel utilized throughout the procedure. Utilizing continuous ultrasound guidance, left midpole calyx was accessed with a 21 gauge needle. 21 gauge needle exchanged for transitional dilator set over 0.035 inch guidewire. Transitional dilator set exchanged for 10 French multipurpose pigtail drain over 0.035 inch guidewire. Drain secured to skin with suture and connected to bag. Contrast administered through the drain under fluoroscopy opacified the left renal collecting system and confirmed appropriate placement. Utilizing continuous ultrasound guidance, right lower pole calyx was accessed with a 21 gauge needle. 21 gauge needle exchanged for transitional dilator set over 0.035 inch guidewire. Transitional dilator set exchanged for 10 French multipurpose pigtail drain over 0.035 inch guidewire. Drain secured to skin with suture and connected to bag. Contrast administered through the drain under fluoroscopy opacified the right renal collecting system and confirmed appropriate placement. Purulent urine was aspirated from both renal  collecting systems. Samples were sent for Gram stain and culture. IMPRESSION: Bilateral 10 French nephrostomy drains placed utilizing fluoroscopic and ultrasound guidance. Electronically Signed   By: Miachel Roux M.D.   On: 10/08/2020 13:08   ECHOCARDIOGRAM LIMITED  Result Date: 10/08/2020    ECHOCARDIOGRAM LIMITED REPORT   Patient Name:   Chase [name] Date of Exam: 10/08/2020 Medical Rec #:  [id number]       Height:       66.0 in Accession #:    [id number]      Weight:       119.3 lb Date of Birth:  May 01, 1965       BSA:          1.605 m Patient Age:    55 years        BP:           126/66 mmHg Patient Gender: M               HR:           61 bpm. Exam Location:  Inpatient Procedure: Limited Echo Indications:    Abnormal ECG  History:        Patient has prior history of Echocardiogram examinations, most                 recent 06/28/2020. Previous Myocardial Infarction. H/O stroke and                 COVID-19. CKD. GERD. Cancer.  Sonographer:    Clayton Lefort RDCS (AE) Referring Phys: [phone] [facility] D HARRIS  Sonographer Comments: Unable to find parasternal or apical imaging windows. Subcostal area covered with ostomy bag and bandages.  Uninterpretable study. Sanda Klein MD Electronically signed by Sanda Klein MD Signature Date/Time: 10/08/2020/3:10:26 PM    Final     Labs:  CBC: Recent Labs    10/07/20 0535 10/08/20 0309 10/08/20 1511 10/09/20 0302  WBC 3.8* 4.3 4.1 7.5  HGB 7.5* 7.2* 9.2* 6.7*  HCT 21.7* 20.9* 26.3* 19.5*  PLT 202  174 162 137*    COAGS: Recent Labs    10/17/19 1241 10/17/19 1311 11/07/19 1836 11/07/19 1853 11/10/19 1052 01/26/20 1050 03/31/20 1420 04/16/20 1354 10/08/20 0811  INR 1.5* 1.6*   < >  --    < > 1.2 1.5* 1.0 1.1  APTT 43* 46*  --  31  --   --  37*  --   --    < > = values in this interval not displayed.    BMP: Recent Labs    01/01/20 0430 01/02/20 0427 01/03/20 0506 01/04/20 0453 01/05/20 0558 10/07/20 1719 10/08/20 0309 10/08/20 1516  10/09/20 0302  NA 143 144 143 143   < > 143 139 137 136  K 3.5 3.9 3.9 3.9   < > 2.9* 3.2* 3.7 4.1  CL 116* 113* 112* 112*   < > 113* 108 106 102  CO2 18* 19* 22 23   < > 18* 25 23 25   GLUCOSE 73 84 81 77   < > 163* 112* 110* 100*  BUN 60* 61* 53* 47*   < > 97* 43* 29* 17  CALCIUM 7.3* 7.5* 7.5* 7.4*   < > 6.4* 7.1* 6.7* 7.2*  CREATININE 6.54* 6.27* 6.42* 5.74*   < > 9.43* 4.14* 3.09* 2.17*  GFRNONAA 9* 9* 9* 10*   < > 6* 16* 23* 35*  GFRAA 10* 11* 10* 12*  --   --   --   --   --    < > = values in this interval not displayed.    LIVER FUNCTION TESTS: Recent Labs    07/06/20 0456 07/07/20 0605 10/06/20 1805 10/07/20 0535 10/07/20 1719 10/08/20 0309 10/08/20 1516 10/09/20 0302  BILITOT 0.7 0.6 0.9 1.1  --   --   --   --   AST 21 17 14* 16  --   --   --   --   ALT 14 12 54* 40  --   --   --   --   ALKPHOS 81 77 129* 94  --   --   --   --   PROT 4.4* 4.3* 7.6 5.8*  --   --   --   --   ALBUMIN 1.7*  1.7* 1.7*  1.6* 3.4* 2.7* 2.4* 2.3* 2.3* 2.2*    Assessment and Plan:  55 y.o. male inpatient. History of Hodgkin's lymphoma, CVA, BPH with urinary retention s/p chronic foley catheter and bilateral double J stents, anemia,  CKD, GIB s/p gastric bypass with partial colectomy and colostomy placement.  Hydronephrosis s/p bilateral nephrostomy tubes converted to double J's and remvoed on 1.25.22. the patient was scheduled for a TURP on 7.7.22 but failed to present for his surgery. A welfare visit was performed on 7.8.22 and Mr. Hsiung was sent to the ED at [facility] after being found with AMS. CT abd pelvis from 7.9.22 showed  bilateral hydronephrosis. Mr Maclaughlin was found to be in AKI.  Patient was sent to [facility] for further management.  On 7.10.22 IR placed 10 Fr bilateral nephrostomy tubes. Per EPIC output  is - Right 35 ml, 213 ml, Left 75 ml,  211 ml. Patient foley catheter has since been removed. BUN down trending 17, Cr down trending 2.17, GFR 35. Hgb 6.7. Urine cultures pending. Positive  bilateral nephrostomy tubes to  gravity bag.  Minimal amount of dark red fluid noted to be in the bilateral nephrostomy tubes. Per RN at bedside nephrostomy tubes are flushed easily.  Per notes from Dr. Junious Silk in Urology dated 7.10.22 Consider removing stents and foley and keep Nx long term.     Recommend team continue output recording q shift and dressing changes as needed.    IR will continue to follow along - plans per Urology and primary team.    Electronically Signed: Jacqualine Mau, NP 10/09/2020, 1:35 PM   I spent a total of 15 Minutes at the patient's bedside AND on the patient's hospital floor or unit, greater than 50% of which was counseling/coordinating care for bilateral nephrostomy tubes placement

## 2020-10-09 NOTE — Plan of Care (Signed)

## 2020-10-09 NOTE — Progress Notes (Signed)
NAME:  Chase [name], MRN:  [id number], DOB:  May 15, 1965, LOS: 3 ADMISSION DATE:  10/06/2020, CONSULTATION DATE:  10/07/2020 REFERRING MD:  Dr. Wynetta Emery , CHIEF COMPLAINT:  Acute renal failure    History of Present Illness:  Chase [name] is a 55 y.o. male with a PMH significant for CKD stage IV, hx of GIB and ulcers requiring gastric bypass, prior stroke, hodgkin's lymphoma, prior MI, cervical radiculopathy, chronic pain syndrome, COVID, and PTSD who presented to the ED via EMS after being found minimally responsive on welfare check. On EMS arrival patient was found in recliner alert but confused. On chart review it appears patient was scheduled for pre op lab work 7/6 and TURP 7/7 but failed to show for either apt.   Patient has an extensive past medical history to include CKD stage 5 that progressed to acute renal failure march of this year and required CRRT. He also has indwelling catheter and bilateral nephrostomy tubes for hydronephrosis. He presents altered but in stable condition with no immediate needs for hemodialysis. Acidosis has improved with bicarb drip. Nephrology has already been consult.   Pertinent  Medical History  Significant for but not limited to: CKD stage IV, hx of GIB and ulcers requiring gastric bypass, prior stroke, hodgkin's lymphoma, prior MI, cervical radiculopathy, chronic pain syndrome, COVID, and PTSD   Significant Hospital Events:  7/8 admitted per [facility] at [facility] for acute renal failure with multiple metabolic derangements 7/9 Transferred from [facility] to [facility] with PCCM and nephrology consult   Interim History / Subjective:  No events. H/H down a bit. Off pressors. Remains on CRRT. Awake, alert, conversant  Objective   Blood pressure (!) 107/56, pulse (!) 55, temperature 98.3 F (36.8 C), temperature source Oral, resp. rate 11, height 5\' 6"  (1.676 m), weight 51 kg, SpO2 100 %.        Intake/Output Summary (Last 24 hours) at 10/09/2020 0910 Last data filed at  10/09/2020 0900 Gross per 24 hour  Intake 1689.29 ml  Output 675 ml  Net 1014.29 ml    Filed Weights   10/06/20 1711 10/08/20 0500 10/09/20 0615  Weight: 65.7 kg 54.1 kg 51 kg    Examination Constitutional: frail man in NAD  Eyes: EOMI, pupils equal Ears, nose, mouth, and throat: Trachea midline, temporal wasting Cardiovascular: RRR, ext warm Respiratory: diminished bases, no wheezing or rhonci Gastrointestinal: Soft, ostomy in place, nephrostomy tubes with small dark Uop Skin: No rashes, normal turgor Neurologic: moves all 4 ext, weak Psychiatric: RASS 0, fair insight  CBG on lower side Chemistries improved with CRRT H/H down, 1 unit ordered, repeat check at 13:00  Resolved Hospital Problem list     Assessment & Plan:  Acute on chronic renal failure- related to some issue with GU drainage and ?malfunctioning stent vs. Foley.  Now s/p bilateral nephrostomy tubes and on CRRT.  Nephrology following, awaiting Dr.  Acute anemia- I think the 9.2 Hgb on 7/10 might be wrong, was 7.2 the day before.  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  I do not see any signs of sepsis. Recurrent diverticulitis with abscess formation leading to complex urological obstruction- s/p diverting ostomy with [facility] resection of redundant sigmoid colon.  ? If he will ever be candidate for re-anastamosis to rectal stump.  - Will discuss trial off CRRT with nephrology to see if nephrostomy output picks up -  Dr. Claudia Desanctis will speak with Dr. Alinda Money to see if he can talk to patient about what long term plan will be for his urological issues - No role for further abx at this time - f/u PM CBC but again the vacillating H/H are a bit confusing, not sure which is accurate - Encouraged to eat more   Best Practice  Diet/type: Regular consistency (see orders) DVT prophylaxis: systemic  heparin GI prophylaxis: PPI Lines: Dialysis Catheter Foley:  N/A Code Status:  DNR Last date of multidisciplinary goals of care discussion 7/9  Patient critically ill due to vasoplegia, renal failure Interventions to address this today CRRT wean Risk of deterioration without these interventions is high  I personally spent 31 minutes providing critical care not including any separately billable procedures  Erskine Emery MD [facility]  Prefer epic messenger for cross cover needs If after hours, please call E-link

## 2020-10-09 NOTE — Progress Notes (Signed)
[facility] Progress Note  Name: Chase [name] MRN: [id number] DOB: 1965/12/18  Chief Complaint:  AMS  Subjective:  he had 850 mL uop over 7/10.  Seen and examined on CRRT.  Required 1u pRBC for Hb 9.2 > 6.7 without obvious bleeding.  No CRRT filter clotting noted.  Agreeable to long term dialysis.    ------------- Background on referral: Chase [name] is a 55 y.o. 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  He was supposed to have a TURP late this week and didn't show up for procedure or pre-op labs.  He was then found to be unresponsive on a welfare check.  He has an indwelling foley.  Nephrology consulted.  Recommended transfer to [facility] for nephrology and urologic care.  I last saw him in 01/02/20 inpatient and he was lost to follow-up with [facility] after that hospitalization after missing multiple appointments.  He did also not establish care with nephrology at the [facility].  He has had interim hospitalizations including one with CRRT.  At a SNF at one point but most recently at home.  Blood pressure has been low range 90's and HR has been 40's most recently from 50's earlier.  He has been on bicarb gtt.   Dr. Royce Macadamia had an extensive conversation with the patient, his son (present via phone), and his nurse.  We discussed the risks/benefits/indications of dialysis/RRT.  They do wish to pursue dialysis and consent for renal replacement therapy.  The patient wishes it hadn't come to this but is not ready to transition to comfort care.  He wants to live independently but his son discussed that that hadn't been going well.   Intake/Output Summary (Last 24 hours) at 10/09/2020 0920 Last data filed at 10/09/2020 0900 Gross per 24 hour  Intake 1689.29 ml  Output 675 ml  Net 1014.29 ml     Vitals:  Vitals:   10/09/20 0800 10/09/20 0807 10/09/20 0815 10/09/20 0830  BP: (!)  100/58 (!) 100/58 99/61 (!) 107/56  Pulse: (!) 49 (!) 56 (!) 56 (!) 55  Resp: 13 14 14 11   Temp:  98.3 F (36.8 C)    TempSrc:  Oral    SpO2: 100% 100% 100% 100%  Weight:      Height:         Physical Exam:  General: thin chronically ill appearing gentleman  HEENT: [place] AT Eyes: EOMI, anicteric Neck: supple trachea midline Heart: RRR on monitor Lungs: normal WOB on RA Extremities: no edema  GU foley in place Access: left IJ nontunneled catheter  Medications reviewed   Labs:  BMP Latest Ref Rng & Units 10/09/2020 10/08/2020 10/08/2020  Glucose 70 - 99 mg/dL 100(H) 110(H) 112(H)  BUN 6 - 20 mg/dL 17 29(H) 43(H)  Creatinine 0.61 - 1.24 mg/dL 2.17(H) 3.09(H) 4.14(H)  Sodium 135 - 145 mmol/L 136 137 139  Potassium 3.5 - 5.1 mmol/L 4.1 3.7 3.2(L)  Chloride 98 - 111 mmol/L 102 106 108  CO2 22 - 32 mmol/L 25 23 25   Calcium 8.9 - 10.3 mg/dL 7.2(L) 6.7(L) 7.1(L)     Assessment/Plan:   # AKI on CKD stage V - hx of advanced CKD and chronic obstruction and chronic indwelling foley.  This event will likely lead to dialysis dependence/ESRD as baseline Cr 4-5.  Dr. Royce Macadamia discussed this with the patient and his family.  Note noncompliance with outpatient nephrology follow-up.  Started CRRT on 7/9 after nontunneled catheter with critical care - d/c CRRT today with plans for [facility] tomorrow.  BP on soft side, will provide midodrine prior to treatment.  - Will also need to place tunneled catheter this week if tolerates iHD and if goals remain the same   # Metabolic acidosis - setting of sepsis and AKI on CKD, managing with RRT   # Sepsis - abx per primary team   # Chronic urinary retention - continue foley catheter - missed scheduled TURP per charting   # Hypokalemia - 4K fluids - now resolved   # Encephalopathy/AMS - setting of AKI as well as acidosis.  Improved and communicative   # Hx CVA - left hemiparesis - previously at SNF and noted he presented from home most recently   #  Anemia - normocytic.  Has rec'd 2u pRBC.   Recheck pending. Will start ESA.    Would ultimately benefit from discharge to a SNF if he is to continue with aggressive care - he has not been able to manage his care or live independently.  His son is in agreement  Justin Mend, MD 10/09/2020 9:20 AM

## 2020-10-10 DIAGNOSIS — D631 Anemia in chronic kidney disease: Secondary | ICD-10-CM

## 2020-10-10 DIAGNOSIS — G9341 Metabolic encephalopathy: Secondary | ICD-10-CM | POA: Diagnosis not present

## 2020-10-10 DIAGNOSIS — N184 Chronic kidney disease, stage 4 (severe): Secondary | ICD-10-CM | POA: Diagnosis not present

## 2020-10-10 DIAGNOSIS — N185 Chronic kidney disease, stage 5: Secondary | ICD-10-CM | POA: Diagnosis not present

## 2020-10-10 DIAGNOSIS — N179 Acute kidney failure, unspecified: Secondary | ICD-10-CM | POA: Diagnosis not present

## 2020-10-10 LAB — RENAL FUNCTION PANEL
Albumin: 1.9 g/dL — ABNORMAL LOW (ref 3.5–5.0)
Albumin: 2 g/dL — ABNORMAL LOW (ref 3.5–5.0)
Anion gap: 7 (ref 5–15)
Anion gap: 10 (ref 5–15)
BUN: 20 mg/dL (ref 6–20)
BUN: 28 mg/dL — ABNORMAL HIGH (ref 6–20)
CO2: 25 mmol/L (ref 22–32)
CO2: 24 mmol/L (ref 22–32)
Calcium: 6.9 mg/dL — ABNORMAL LOW (ref 8.9–10.3)
Calcium: 6.8 mg/dL — ABNORMAL LOW (ref 8.9–10.3)
Chloride: 103 mmol/L (ref 98–111)
Chloride: 102 mmol/L (ref 98–111)
Creatinine, Ser: 3.07 mg/dL — ABNORMAL HIGH (ref 0.61–1.24)
Creatinine, Ser: 3.84 mg/dL — ABNORMAL HIGH (ref 0.61–1.24)
GFR, Estimated: 23 mL/min — ABNORMAL LOW (ref >60)
GFR, Estimated: 18 mL/min — ABNORMAL LOW (ref >60)
Glucose, Bld: 108 mg/dL — ABNORMAL HIGH (ref 70–99)
Glucose, Bld: 102 mg/dL — ABNORMAL HIGH (ref 70–99)
Phosphorus: 4.3 mg/dL (ref 2.5–4.6)
Phosphorus: 5.3 mg/dL — ABNORMAL HIGH (ref 2.5–4.6)
Potassium: 3.7 mmol/L (ref 3.5–5.1)
Potassium: 3.6 mmol/L (ref 3.5–5.1)
Sodium: 135 mmol/L (ref 135–145)
Sodium: 136 mmol/L (ref 135–145)

## 2020-10-10 LAB — TYPE AND SCREEN
ABO/RH(D): O POS
Antibody Screen: NEGATIVE
Unit division: 0
Unit division: 0
Unit division: 0

## 2020-10-10 LAB — BPAM RBC
Blood Product Expiration Date: 202208052359
Blood Product Expiration Date: 202208062359
Blood Product Expiration Date: 202208082359
ISSUE DATE / TIME: 202207100823
ISSUE DATE / TIME: 202207110610
ISSUE DATE / TIME: 202207111714
Unit Type and Rh: 5100
Unit Type and Rh: 5100
Unit Type and Rh: 5100

## 2020-10-10 LAB — CBC
HCT: 22.3 % — ABNORMAL LOW (ref 39.0–52.0)
Hemoglobin: 7.6 g/dL — ABNORMAL LOW (ref 13.0–17.0)
MCH: 31.3 pg (ref 26.0–34.0)
MCHC: 34.1 g/dL (ref 30.0–36.0)
MCV: 91.8 fL (ref 80.0–100.0)
Platelets: 106 10*3/uL — ABNORMAL LOW (ref 150–400)
RBC: 2.43 MIL/uL — ABNORMAL LOW (ref 4.22–5.81)
RDW: 16.5 % — ABNORMAL HIGH (ref 11.5–15.5)
WBC: 8.5 10*3/uL (ref 4.0–10.5)
nRBC: 0.2 % (ref 0.0–0.2)

## 2020-10-10 LAB — MAGNESIUM: Magnesium: 2.2 mg/dL (ref 1.7–2.4)

## 2020-10-10 MED ORDER — ADULT MULTIVITAMIN W/MINERALS CH
1.0000 | ORAL_TABLET | Freq: Two times a day (BID) | ORAL | Status: DC
  Administered 2020-10-10 – 2020-10-26 (×28): 1 via ORAL
  Filled 2020-10-10 (×29): qty 1

## 2020-10-10 NOTE — Progress Notes (Addendum)
Initial Nutrition Assessment  DOCUMENTATION CODES:   Underweight, Severe malnutrition in context of chronic illness  INTERVENTION:   Magic cup TID with meals, each supplement provides 290 kcal and 9 grams of protein Double protein portions at meals  MVI with minerals BID Calcium carbonate 500 mg TID  Check Vitamin A, zinc, Vitamin C, Vitamin D, thiamine, copper given history of gastric bypass and poor PO  If intake unable to progress recommend Cortrak placement with EN given severity of malnutrition   NUTRITION DIAGNOSIS:   Severe Malnutrition related to chronic illness (hydronephrosis, gastric bypass) as evidenced by severe fat depletion, severe muscle depletion, energy intake < or equal to 75% for > or equal to 1 month, percent weight loss.  GOAL:   Patient will meet greater than or equal to 90% of their needs  MONITOR:   PO intake, Weight trends, Supplement acceptance, Labs, I & O's  REASON FOR ASSESSMENT:   Malnutrition Screening Tool    ASSESSMENT:    Patient with PMH significant for CKD IV, GIB, ulcers requiring gastric bypass (on 08/24/19), CVA, hodgkin's lymphoma, MI, cervical radiculopathy, recent COVID, recurrent diverticulitis with abscess formation s/p diverting ostomy with [facility] resection of redundant sigmoid colon, and bilateral hydronephrosis scheduled for TURP on 7/7 but was unable to show. Presents this admission with ARF and failure to thrive.   7/10- s/p IR placement of bilateral nephrostomy tubes   Awaiting to see if patient will require long term HD.   Patient denies decreased intake PTA. Typically consumes two meal daily that consists of L- deli sandwich D- steak and vegetable. Does not use supplementation as he does not like the taste. Intake this admission has not progressed. Last meal completion charted as 15%. RD offered snacks and different supplements but patient does not wish to have them. Suspect patient has multiple vitamin/mineral  deficiencies given history of gastric bypass.   Patient endorses a UBW of 185 lb and an unknown amount of weight loss since his COVID diagnosis. Records indicate patient weighed 128 lb on 1/25 and 102 lb this admission (20.3% wt loss in 6 months, significant for time frame).   UOP: 460 ml x 24 hrs   Vitamin/MIneral Profile:  Thiamine: pending Vitamin A: pending Vitamin D: pending  Vitamin C: pending Copper: pending  Zinc: pending   Medications: aranesp Labs: Cr 3.07- up from yesterday   NUTRITION - FOCUSED PHYSICAL EXAM:  Flowsheet Row Most Recent Value  Orbital Region Severe depletion  Upper Arm Region Severe depletion  Thoracic and Lumbar Region Severe depletion  Buccal Region Severe depletion  Temple Region Severe depletion  Clavicle Bone Region Severe depletion  Clavicle and Acromion Bone Region Severe depletion  Scapular Bone Region Severe depletion  Dorsal Hand Severe depletion  Patellar Region Severe depletion  Anterior Thigh Region Severe depletion  Posterior Calf Region Severe depletion  Edema (RD Assessment) None  Hair Reviewed  Eyes Reviewed  Mouth Reviewed  Skin Reviewed  Nails Reviewed      Diet Order:   Diet Order             Diet regular Room service appropriate? Yes; Fluid consistency: Thin  Diet effective now                   EDUCATION NEEDS:   Education needs have been addressed  Skin:  Skin Assessment: Skin Integrity Issues: Skin Integrity Issues:: Stage II Stage II: buttocks  Last BM:  7/12  Height:   Ht Readings from  Last 1 Encounters:  10/06/20 5\' 6"  (1.676 m)    Weight:   Wt Readings from Last 1 Encounters:  10/10/20 46.4 kg    BMI:  Body mass index is 16.51 kg/m.  Estimated Nutritional Needs:   Kcal:  1500-1700 kcal  Protein:  75-90 grams  Fluid:  >/= 1.5 L/day  Mariana Single MS, RD, LDN, CNSC Clinical Nutrition Pager listed in [place]

## 2020-10-10 NOTE — Progress Notes (Signed)
NAME:  Chase [name], MRN:  [id number], DOB:  01/10/1966, LOS: 4 ADMISSION DATE:  10/06/2020, CONSULTATION DATE:  10/07/2020 REFERRING MD:  Dr. Wynetta [name] , CHIEF COMPLAINT:  Acute renal failure    History of Present Illness:  Chase [name] is a 55 y.o. male with a PMH significant for CKD stage IV, hx of GIB and ulcers requiring gastric bypass, prior stroke, hodgkin's lymphoma, prior MI, cervical radiculopathy, chronic pain syndrome, COVID, and PTSD who presented to the ED via EMS after being found minimally responsive on welfare check. On EMS arrival patient was found in recliner alert but confused. On chart review it appears patient was scheduled for pre op lab work 7/6 and TURP 7/7 but failed to show for either apt.   Patient has an extensive past medical history to include CKD stage 5 that progressed to acute renal failure march of this year and required CRRT. He also has indwelling catheter and bilateral nephrostomy tubes for hydronephrosis. He presents altered but in stable condition with no immediate needs for hemodialysis. Acidosis has improved with bicarb drip. Nephrology has already been consult.   Pertinent  Medical History  Significant for but not limited to: CKD stage IV, hx of GIB and ulcers requiring gastric bypass, prior stroke, hodgkin's lymphoma, prior MI, cervical radiculopathy, chronic pain syndrome, COVID, and PTSD   Significant Hospital Events:  7/8 admitted per [facility] at [facility] for acute renal failure with multiple metabolic derangements 7/9 Transferred from [facility] to [facility] with PCCM and nephrology consult   Interim History / Subjective:  No events. Off CRRT. Some nephrostomy tube output. Denies pain. Weak.  Objective   Blood pressure (!) 99/58, pulse (!) 52, temperature 98.3 F (36.8 C), temperature source Axillary, resp. rate 11, height 5\' 6"  (1.676 m), weight 46.4 kg, SpO2 99 %.        Intake/Output Summary (Last 24 hours) at 10/10/2020 0820 Last data filed at 10/10/2020  0300 Gross per 24 hour  Intake 928.83 ml  Output 456 ml  Net 472.83 ml    Filed Weights   10/08/20 0500 10/09/20 0615 10/10/20 0300  Weight: 54.1 kg 51 kg 46.4 kg    Examination Constitutional: frail man in NAD  Eyes: EOMI, pupils equal Ears, nose, mouth, and throat: Trachea midline, temporal wasting Cardiovascular: RRR, ext warm Respiratory: diminished bases, no wheezing or rhonci Gastrointestinal: Soft, ostomy in place, nephrostomy tubes with a little more amber output today Skin: No rashes, normal turgor Neurologic: moves all 4 ext, weak Psychiatric: RASS 0, fair insight  CBG on lower side Chemistries improved with CRRT H/H down, 1 unit ordered, repeat check at 13:00  Resolved Hospital Problem list     Assessment & Plan:  Acute on chronic renal failure- related to some issue with GU drainage and ?malfunctioning stent vs. Foley.  Now s/p bilateral nephrostomy tubes and on CRRT (off yesterday).  Further urological plans hinge on whether there is renal recover (see Dr. Lynne [name] note 7/110. Acute anemia- received 2 units pRBC 7/11, hgb 7.6 this am.  No s/s of bleeding.  If continues to drop would CT abdomen. 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  I do not see any signs of sepsis.  Now on midodrine. Recurrent diverticulitis with abscess formation leading to complex urological obstruction- s/p diverting ostomy with [facility] resection of redundant sigmoid colon.  ? If he will ever be candidate for re-anastamosis to rectal stump.  -  Monitor CBC, if continues to be transfusion dependent consider CT a/p to make sure nephrostomy tubes have not caused a hematoma; no s/s of this at present - No role for further abx at this time - Encouraged to eat more  - Watchful waiting to see if will need RRT long term, appreciate nephrology help - Further operative plans depending on RRT - PT/OT, OOB  to chair - Continue midodrine - Stable for transfer to floor, appreciate TR[facility] taking over starting 10/11/20   Chase Emery MD [name] [place] epic messenger for cross cover needs If after hours, please call E-link

## 2020-10-10 NOTE — Progress Notes (Signed)
[facility] Progress Note  Name: Chase [name] MRN: [id number] DOB: [birth year]  Chief Complaint:  AMS  Subjective:  he had 460 mL uop over 7/11. Off CRRT since yesterday AM.  Feeling fine today - no uremic symptoms.    ------------- Background on referral: Chase [name] is a 55 y.o. 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  He was supposed to have a TURP late this week and didn't show up for procedure or pre-op labs.  He was then found to be unresponsive on a welfare check.  He has an indwelling foley.  Nephrology consulted.  Recommended transfer to [facility] for nephrology and urologic care.  I last saw him in 01/02/20 inpatient and he was lost to follow-up with [facility] after that hospitalization after missing multiple appointments.  He did also not establish care with nephrology at the [facility].  He has had interim hospitalizations including one with CRRT.  At a SNF at one point but most recently at home.  Blood pressure has been low range 90's and HR has been 40's most recently from 50's earlier.  He has been on bicarb gtt.   Dr. Royce Macadamia had an extensive conversation with the patient, his son (present via phone), and his nurse.  We discussed the risks/benefits/indications of dialysis/RRT.  They do wish to pursue dialysis and consent for renal replacement therapy.  The patient wishes it hadn't come to this but is not ready to transition to comfort care.  He wants to live independently but his son discussed that that hadn't been going well.   Intake/Output Summary (Last 24 hours) at 10/10/2020 1019 Last data filed at 10/10/2020 0300 Gross per 24 hour  Intake 844.93 ml  Output 460 ml  Net 384.93 ml     Vitals:  Vitals:   10/10/20 0700 10/10/20 0745 10/10/20 0800 10/10/20 0802  BP: (!) 99/58  97/63   Pulse: (!) 54 (!) 52 (!) 53   Resp: 13 11 11    Temp:    98.3 F (36.8  C)  TempSrc:    Axillary  SpO2: 99% 99% 98%   Weight:      Height:         Physical Exam:  General: thin chronically ill appearing gentleman  HEENT: [place] AT Eyes: EOMI, anicteric Neck: supple trachea midline Heart: RRR on monitor Lungs: normal WOB on RA Extremities: no edema  GU foley in place Access: left IJ nontunneled catheter  Medications reviewed   Labs:  BMP Latest Ref Rng & Units 10/10/2020 10/09/2020 10/09/2020  Glucose 70 - 99 mg/dL 108(H) 122(H) 100(H)  BUN 6 - 20 mg/dL 20 16 17   Creatinine 0.61 - 1.24 mg/dL 3.07(H) 2.28(H) 2.17(H)  Sodium 135 - 145 mmol/L 135 136 136  Potassium 3.5 - 5.1 mmol/L 3.7 3.8 4.1  Chloride 98 - 111 mmol/L 103 104 102  CO2 22 - 32 mmol/L 25 25 25   Calcium 8.9 - 10.3 mg/dL 6.9(L) 6.9(L) 7.2(L)     Assessment/Plan:   # AKI on CKD stage V - hx of advanced CKD and chronic obstruction and chronic indwelling foley.  This event will likely lead to dialysis dependence/ESRD as baseline Cr 4-5.  Dr. Royce Macadamia discussed this with the patient and his family.  Note noncompliance with outpatient nephrology follow-up.  Started CRRT on 7/9 after nontunneled catheter with critical care - Off CRRT since 7/12, no indications for dialysis  today, will make daily assessment.  - Will also need to place tunneled catheter if cont to require HD   # Metabolic acidosis: improved with RRT.    # Chronic urinary retention: missed scheduled TURP per charting - s/p BL nephrostomy tubes this admission   # Encephalopathy/AMS - setting of AKI as well as acidosis.  Improved and communicative   # Hx CVA - left hemiparesis - previously at SNF and noted he presented from home most recently   # Anemia - normocytic.  Has rec'd 2u pRBC.   Started ESA 7/11.  Plans for CT a/p if continued decline in H/H.    Would ultimately benefit from discharge to a SNF if he is to continue with aggressive care - he has not been able to manage his care or live independently.  His son is in  agreement  Justin Mend, MD 10/10/2020 10:19 AM

## 2020-10-11 DIAGNOSIS — G9341 Metabolic encephalopathy: Secondary | ICD-10-CM | POA: Diagnosis not present

## 2020-10-11 DIAGNOSIS — F419 Anxiety disorder, unspecified: Secondary | ICD-10-CM

## 2020-10-11 DIAGNOSIS — N401 Enlarged prostate with lower urinary tract symptoms: Secondary | ICD-10-CM

## 2020-10-11 DIAGNOSIS — Z7189 Other specified counseling: Secondary | ICD-10-CM | POA: Diagnosis not present

## 2020-10-11 DIAGNOSIS — F4310 Post-traumatic stress disorder, unspecified: Secondary | ICD-10-CM

## 2020-10-11 DIAGNOSIS — Z515 Encounter for palliative care: Secondary | ICD-10-CM | POA: Diagnosis not present

## 2020-10-11 DIAGNOSIS — N179 Acute kidney failure, unspecified: Secondary | ICD-10-CM | POA: Diagnosis not present

## 2020-10-11 DIAGNOSIS — N185 Chronic kidney disease, stage 5: Secondary | ICD-10-CM | POA: Diagnosis not present

## 2020-10-11 DIAGNOSIS — M961 Postlaminectomy syndrome, not elsewhere classified: Secondary | ICD-10-CM

## 2020-10-11 DIAGNOSIS — F32A Depression, unspecified: Secondary | ICD-10-CM

## 2020-10-11 DIAGNOSIS — N138 Other obstructive and reflux uropathy: Secondary | ICD-10-CM

## 2020-10-11 DIAGNOSIS — N184 Chronic kidney disease, stage 4 (severe): Secondary | ICD-10-CM | POA: Diagnosis not present

## 2020-10-11 LAB — CULTURE, BLOOD (ROUTINE X 2)
Culture: NO GROWTH
Culture: NO GROWTH
Special Requests: ADEQUATE

## 2020-10-11 LAB — RENAL FUNCTION PANEL
Albumin: 1.8 g/dL — ABNORMAL LOW (ref 3.5–5.0)
Albumin: 1.9 g/dL — ABNORMAL LOW (ref 3.5–5.0)
Anion gap: 7 (ref 5–15)
Anion gap: 7 (ref 5–15)
BUN: 28 mg/dL — ABNORMAL HIGH (ref 6–20)
BUN: 10 mg/dL (ref 6–20)
CO2: 22 mmol/L (ref 22–32)
CO2: 28 mmol/L (ref 22–32)
Calcium: 6.5 mg/dL — ABNORMAL LOW (ref 8.9–10.3)
Calcium: 6.9 mg/dL — ABNORMAL LOW (ref 8.9–10.3)
Chloride: 106 mmol/L (ref 98–111)
Chloride: 102 mmol/L (ref 98–111)
Creatinine, Ser: 4.05 mg/dL — ABNORMAL HIGH (ref 0.61–1.24)
Creatinine, Ser: 1.99 mg/dL — ABNORMAL HIGH (ref 0.61–1.24)
GFR, Estimated: 16 mL/min — ABNORMAL LOW (ref >60)
GFR, Estimated: 39 mL/min — ABNORMAL LOW (ref >60)
Glucose, Bld: 99 mg/dL (ref 70–99)
Glucose, Bld: 90 mg/dL (ref 70–99)
Phosphorus: 5.6 mg/dL — ABNORMAL HIGH (ref 2.5–4.6)
Phosphorus: 2.5 mg/dL (ref 2.5–4.6)
Potassium: 3.7 mmol/L (ref 3.5–5.1)
Potassium: 3.8 mmol/L (ref 3.5–5.1)
Sodium: 135 mmol/L (ref 135–145)
Sodium: 137 mmol/L (ref 135–145)

## 2020-10-11 LAB — CBC
HCT: 22.3 % — ABNORMAL LOW (ref 39.0–52.0)
Hemoglobin: 7.3 g/dL — ABNORMAL LOW (ref 13.0–17.0)
MCH: 31.2 pg (ref 26.0–34.0)
MCHC: 32.7 g/dL (ref 30.0–36.0)
MCV: 95.3 fL (ref 80.0–100.0)
Platelets: 155 10*3/uL (ref 150–400)
RBC: 2.34 MIL/uL — ABNORMAL LOW (ref 4.22–5.81)
RDW: 16.7 % — ABNORMAL HIGH (ref 11.5–15.5)
WBC: 9.7 10*3/uL (ref 4.0–10.5)
nRBC: 0 % (ref 0.0–0.2)

## 2020-10-11 LAB — MAGNESIUM: Magnesium: 2.4 mg/dL (ref 1.7–2.4)

## 2020-10-11 LAB — HEPATITIS B SURFACE ANTIGEN: Hepatitis B Surface Ag: NONREACTIVE

## 2020-10-11 MED ORDER — SODIUM CHLORIDE 0.9 % IV SOLN: 100.0000 mL | INTRAVENOUS | Status: DC | PRN

## 2020-10-11 MED ORDER — LIDOCAINE HCL (PF) 1 % IJ SOLN: 5.0000 mL | INTRAMUSCULAR | Status: DC | PRN

## 2020-10-11 MED ORDER — HEPARIN SODIUM (PORCINE) 1000 UNIT/ML DIALYSIS
1000.0000 [IU] | INTRAMUSCULAR | Status: DC | PRN
  Administered 2020-10-11: 1000 [IU] via INTRAVENOUS_CENTRAL

## 2020-10-11 MED ORDER — ALTEPLASE 2 MG IJ SOLR: 2.0000 mg | Freq: Once | INTRAMUSCULAR | Status: DC | PRN

## 2020-10-11 MED ORDER — LIDOCAINE-PRILOCAINE 2.5-2.5 % EX CREA: 1.0000 "application " | TOPICAL_CREAM | CUTANEOUS | Status: DC | PRN

## 2020-10-11 MED ORDER — PENTAFLUOROPROP-TETRAFLUOROETH EX AERO: 1.0000 "application " | INHALATION_SPRAY | CUTANEOUS | Status: DC | PRN

## 2020-10-11 NOTE — Progress Notes (Addendum)
Pt admitted with the above diagnoses and presents with below problem list. Pt will benefit from continued acute OT to address the below listed deficits and maximize independence with basic ADLs prior to d/c to venue below. Pt received in bed and noted to have cathectic appearance. H/o being found unresponsive/minimally responsive at home 2x this year. Pt reports he manages at home by pacing activity and only doing what he feels up to doing. Pt declined EOB/OOB this session d/t fatigue and "just want(ing) to get some sleep". Pt agreeable to bed level eval. Pt clearly struggling at home and has a h/o hospital readmits. At this time he is adamant about going home from the hospital. May benefit from son or trusted family member discussing concerns and d/c planning with pt. If he does end up d/c home, will need to maximize [facility] services.   Tyrone Schimke, OT Acute Rehabilitation Services Pager: [phone] Office: [phone]    10/11/20 1200  OT Visit Information  Last OT Received On 10/11/20  Assistance Needed +1 (+2 for safety to attempt standing/walking)  History of Present Illness Pt is 55 yo male who presented to the ED via EMS after being found minimally responsive on welfare check. PMH significant for CKD stage IV, hx of GIB and ulcers requiring gastric bypass, prior stroke, hodgkin's lymphoma, prior MI, cervical radiculopathy, chronic pain syndrome, COVID, and PTSD.  Precautions  Precautions Fall  Restrictions  Weight Bearing Restrictions No  Home Living  Family/patient expects to be discharged to: Private residence  Living Arrangements Alone  Available Help at Discharge Available PRN/intermittently;Other (Comment) (Pt reports someone checks in on him once a week but unable to state who.)  Type of [place] entrance  Home Layout One level  Bathroom Shower/Tub Walk-in shower  [place] height  Bathroom Accessibility Yes  Home Equipment [facility]   Additional Comments Has two bedside commodes: Keeps one over a toilet and the other used as a shower chair in pt's walk in shower.  Prior Function  Level of Independence Needs assistance  Gait / Transfers Assistance Needed per chart review uses rollator at baseline. H/o frequent falls.  ADL's / Homemaking Assistance Needed Pt reports he is able to take care of himself, do laundry, etc. He reports pacing activity as a strategy he uses. Per chart review and clinical judgment suspect pt struggly with ADL tasks at home.  Comments noted that pt has now has at least 2 episodes of being found down/minimally responsive in his home. Per chart review son feels pt is not managing well at home. Prior CVA in July with residual LT sided weakness, prior [employer] and enjoys fishing.  Communication  Communication No difficulties  Pain Assessment  Pain Assessment Faces  Faces Pain Scale 4  Pain Location unspecified. discomfort  Pain Descriptors / Indicators Discomfort  Pain Intervention(s) Monitored during session  Cognition  Arousal/Alertness Awake/alert  Behavior During Therapy Flat affect;WFL for tasks assessed/performed (easily frustrated)  Overall Cognitive Status Within Functional Limits for tasks assessed  General Comments decreased insight into deficits vs behavioral? Clearly not managing well at home but adament about returning home from hospital  Upper Extremity Assessment  Upper Extremity Assessment Generalized weakness;LUE deficits/detail  LUE Deficits / Details LUE grossly a bit weaker than RUE  Lower Extremity Assessment  Lower Extremity Assessment Defer to PT evaluation;Generalized weakness;LLE deficits/detail  LLE Deficits / Details LLE noted to be weaker than RLE. 3-/5 grossly  ADL  Overall ADL's  Needs assistance/impaired  Eating/Feeding Set up;Modified independent  Grooming Minimal assistance  Upper Body Bathing Moderate assistance  Lower Body Bathing Maximal assistance  Upper Body  Dressing  Moderate assistance  Lower Body Dressing Maximal assistance  General ADL Comments Appears catchectic. Declined OOB activity d/t fatigue. Reports he did not sleep well and "just want to get caught up on some rest." "if I want to do something I do it otherwise, I don't." Pt agreeable to bed level assessment. Would recommend +2 assist if attempting to stand/mobilize.  Bed Mobility  Overal bed mobility Needs Assistance  General bed mobility comments Pt declined bed mobility. Per clinical judgement he currently needs up to max assist +1 to come to full EOB position.  Transfers  General transfer comment Pt declined OOB activity at this time d/t fatigue and desire to rest at this time.  Balance  Overall balance assessment History of Falls  Exercises  Exercises Other exercises  Other Exercises  Other Exercises Pt reports working with HHOT prior to this admission. OT to provide pt with theraband while in hospital to continue working on his strength and activity tolerance.  OT - End of Session  Activity Tolerance Patient limited by fatigue  Patient left in bed;with call bell/phone within reach  Nurse Communication Other (comment) (CM: pt refusing SNF, needs max home services if going home)  OT Assessment  OT Recommendation/Assessment Patient needs continued OT Services  OT Visit Diagnosis Unsteadiness on feet (R26.81);Muscle weakness (generalized) (M62.81);Pain;Adult, failure to thrive (R62.7);History of falling (Z91.81)  OT Problem List Decreased strength;Decreased activity tolerance;Impaired balance (sitting and/or standing);Decreased knowledge of use of DME or AE;Decreased knowledge of precautions;Pain  Barriers to Discharge Decreased caregiver support;Other (comment)  Barriers to Discharge Comments At least 2x this year has been found minimally responsive on the floor of his house. H/o falls. No family nearby. Pt reports someone checks in on him once a week but this session he was unable  to identify a specific person.  OT Plan  OT Frequency (ACUTE ONLY) Min 2X/week  OT Treatment/Interventions (ACUTE ONLY) Self-care/ADL training;Therapeutic exercise;Energy conservation;DME and/or AE instruction;Therapeutic activities;Patient/family education;Balance training  AM-PAC OT "6 Clicks" Daily Activity Outcome Measure (Version 2)  Help from another person eating meals? 3  Help from another person taking care of personal grooming? 3  Help from another person toileting, which includes using toliet, bedpan, or urinal? 2  Help from another person bathing (including washing, rinsing, drying)? 2  Help from another person to put on and taking off regular upper body clothing? 2  Help from another person to put on and taking off regular lower body clothing? 2  6 Click Score 14  Progressive Mobility  What is the highest level of mobility based on the progressive mobility assessment?  (unable to assess this session. pt refused EOB/OOB this session.)  OT Recommendation  Recommendations for Other Services PT consult  Follow Up Recommendations SNF;Supervision/Assistance - 24 hour;Other (comment) (noted that pt is currently refusing SNF. If he goes home will need to maximize home health services [facility] RN, PT, OT, aide))  OT Equipment Other (comment) (TBD)  Individuals Consulted  Consulted and Agree with Results and Recommendations Patient  Acute Rehab OT Goals  Patient Stated Goal to go home from the hospital  OT Goal Formulation With patient  Time For Goal Achievement 10/25/20  Potential to Achieve Goals Fair  OT Time Calculation  OT Start Time (ACUTE ONLY) 1015  OT Stop Time (ACUTE ONLY) 1035  OT Time Calculation (  min) 20 min  OT General Charges  $OT Visit 1 Visit  OT Evaluation  $OT Eval Moderate Complexity 1 Mod  Written Expression  Dominant Hand Right

## 2020-10-11 NOTE — Progress Notes (Signed)
[name] Progress Note  Name: [name] MRN: [id number] DOB: 1965/04/29  Chief Complaint:  AMS  Subjective:  he had 460 mL uop over 7/11, 985m 7/12. Off CRRT since 7/11 AM labs with BUN stable at 28 and cr 4.05 from 3.84.  Feeling fine today - no uremic symptoms.    ------------- Background on referral: PCAVON NICOLLSis a 55y.o. 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  He was supposed to have a TURP late this week and didn't show up for procedure or pre-op labs.  He was then found to be unresponsive on a welfare check.  He has an indwelling foley.  Nephrology consulted.  [facility].  I last saw him in 01/02/20 inpatient and [facility] after missing multiple appointments.  [name]  He has had interim hospitalizations including one with CRRT.  At a SNF at one point but most recently at home.  Blood pressure has been low range 90's and HR has been 40's most recently from 50's earlier.  He has been on bicarb gtt.   Dr. FRoyce Macadamiahad an extensive conversation with the patient, his son (present via phone), and his nurse.  We discussed the risks/benefits/indications of dialysis/RRT.  They do wish to pursue dialysis and consent for renal replacement therapy.  The patient wishes it hadn't come to this but is not ready to transition to comfort care.  He wants to live independently but his son discussed that that hadn't been going well.   Intake/Output Summary (Last 24 hours) at 10/11/2020 1056 Last data filed at 10/11/2020 0900 Gross per 24 hour  Intake --  Output 1625 ml  Net -1625 ml     Vitals:  Vitals:   10/10/20 2015 10/11/20 0500 10/11/20 0512 10/11/20 0956  BP: 95/61  108/65 97/62  Pulse:   67 61   Resp: 18  16 16   Temp: 97.9 F (36.6 C)  97.7 F (36.5 C) 98.1 F (36.7 C)  TempSrc: Oral   Oral  SpO2: 100%   99%  Weight:  49.3 kg    Height:         Physical Exam:  General: thin chronically ill appearing gentleman  HEENT: [place] AT Eyes: EOMI, anicteric Neck: supple trachea midline Heart: RRR on monitor Lungs: normal WOB on RA Extremities: no edema  GU foley in place Access: left IJ nontunneled catheter  Medications reviewed   Labs:  BMP Latest Ref Rng & Units 10/11/2020 10/10/2020 10/10/2020  Glucose 70 - 99 mg/dL 99 102(H) 108(H)  BUN 6 - 20 mg/dL 28(H) 28(H) 20  Creatinine 0.61 - 1.24 mg/dL 4.05(H) 3.84(H) 3.07(H)  Sodium 135 - 145 mmol/L 135 136 135  Potassium 3.5 - 5.1 mmol/L 3.7 3.6 3.7  Chloride 98 - 111 mmol/L 106 102 103  CO2 22 - 32 mmol/L 22 24 25   Calcium 8.9 - 10.3 mg/dL 6.5(L) 6.8(L) 6.9(L)     Assessment/Plan:   # AKI on CKD stage V - hx of advanced CKD and chronic obstruction and chronic indwelling foley.  Presented with severe AKI requiring CRRT 7/9. Had BL hydronephrosis despite foley and ureteral stents so underwent nephrostomy tube placement as well.   - Off CRRT since 7/12, no indications for dialysis today, will  make daily assessment.  - Given low muscle mass I suspect eGFR is overestimate - will check 24h urine for CrCl to help aid in decision re: chronic dialysis, 24h collection should complete 7/14 - If requires ongoing HD will also need to place tunneled catheter and obtain perm access; maintain temp HD catheter for now pending 24h collection   # Chronic urinary retention: missed scheduled TURP per charting - s/p BL nephrostomy tubes this admission   # Encephalopathy/AMS - setting of AKI as well as acidosis.  Improved and communicative   # Hx CVA - left hemiparesis - previously at SNF and noted he presented from home most recently   # Anemia - normocytic.  Has rec'd 2u pRBC.   Started ESA 7/11.  Primary plans for CT a/p if continued decline  in H/H.    Would ultimately benefit from discharge to a SNF if he is to continue with aggressive care - he has not been able to manage his care or live independently.  His son is in agreement  Chase Mend, MD 10/11/2020 10:56 AM

## 2020-10-11 NOTE — Progress Notes (Signed)
Referring Physician(s): W.Harris NP  Supervising Physician: Sandi Mariscal  Patient Status:  [facility] - In-pt  Chief Complaint:  S/p bilateral PCN on 10/08/2020 with Dr. Dwaine Gale  Subjective:  Patient laying in bed, not in acute distress.  RN and NT at the bedside. Patient states that persistent moderate pain in bilateral flank.   Allergies: Nsaids and Ciprofloxacin  Medications: Prior to Admission medications   Medication Sig Start Date End Date Taking? Authorizing Provider  cyanocobalamin (,VITAMIN B-12,) 1000 MCG/ML injection Inject 1,000 mcg into the muscle every 30 (thirty) days.  12/28/13  Yes [provider]  DULoxetine (CYMBALTA) 30 MG capsule Take 1 capsule (30 mg total) by mouth 2 (two) times daily. 07/07/20 07/07/21 Yes Sheikh, Omair Latif, DO  hydrOXYzine (ATARAX/VISTARIL) 10 MG tablet Take 1 tablet (10 mg total) by mouth 3 (three) times daily as needed for itching or anxiety. 07/07/20  Yes Sheikh, Omair Latif, DO  Multiple Vitamin (MULTIVITAMIN WITH MINERALS) TABS tablet Take 1 tablet by mouth daily. 05/03/20  Yes Samella Parr, NP  pantoprazole (PROTONIX) 20 MG tablet TAKE 1 TABLET (20 MG TOTAL) BY MOUTH 2 (TWO) TIMES DAILY. Patient taking differently: Take 20 mg by mouth 2 (two) times daily. 05/03/20 05/03/21 Yes Samella Parr, NP  silodosin (RAPAFLO) 8 MG CAPS capsule Take 1 capsule (8 mg total) by mouth daily with breakfast. 07/07/20  Yes Sheikh, Omair Latif, DO  sodium bicarbonate 650 MG tablet TAKE 1 TABLET (650 MG TOTAL) BY MOUTH 2 (TWO) TIMES DAILY. Patient taking differently: Take 650 mg by mouth 2 (two) times daily. 05/03/20 05/03/21 Yes Samella Parr, NP  acetaminophen (TYLENOL) 500 MG tablet Take 1 tablet (500 mg total) by mouth every 6 (six) hours as needed for mild pain, fever or headache. Patient not taking: No sig reported 05/01/20   Samella Parr, NP  calcium carbonate (TUMS - DOSED IN MG ELEMENTAL CALCIUM) 500 MG chewable tablet Chew 1 tablet (200 mg of  elemental calcium total) by mouth 3 (three) times daily. Patient not taking: No sig reported 05/01/20   Samella Parr, NP  feeding supplement (ENSURE ENLIVE / ENSURE PLUS) LIQD Take 237 mLs by mouth 2 (two) times daily between meals. Patient not taking: Reported on 10/07/2020 07/07/20   Raiford Noble Latif, DO  fentaNYL (DURAGESIC) 25 MCG/HR Place 1 patch onto the skin every 3 (three) days. Patient not taking: Reported on 10/07/2020 07/08/20   Raiford Noble Latif, DO  folic acid (FOLVITE) 1 MG tablet Take 1 tablet (1 mg total) by mouth daily. Patient not taking: Reported on 10/07/2020 07/08/20   Raiford Noble Latif, DO  gabapentin (NEURONTIN) 100 MG capsule TAKE 2 CAPSULES (200 MG TOTAL) BY MOUTH DAILY. Patient not taking: Reported on 10/07/2020 05/03/20 05/03/21  Samella Parr, NP  gabapentin (NEURONTIN) 300 MG capsule TAKE 1 CAPSULE (300 MG TOTAL) BY MOUTH AT BEDTIME. Patient not taking: Reported on 10/07/2020 05/03/20 05/03/21  Samella Parr, NP  Nutritional Supplements (,FEEDING SUPPLEMENT, PROSOURCE PLUS) liquid Take 30 mLs by mouth 2 (two) times daily between meals. Patient not taking: Reported on 10/07/2020 07/07/20   Raiford Noble Latif, DO  polycarbophil (FIBERCON) 625 MG tablet Take 1 tablet (625 mg total) by mouth 2 (two) times daily. Patient not taking: Reported on 10/07/2020 05/01/20   Samella Parr, NP  polyethylene glycol (MIRALAX / GLYCOLAX) 17 g packet Take 17 g by mouth daily as needed for moderate constipation. Patient not taking: Reported on 10/07/2020 07/07/20  Sheikh, Omair Latif, DO  Vitamin D3 (VITAMIN D) 25 MCG tablet TAKE 800 UNITS BY MOUTH DAILY. Patient not taking: Reported on 10/07/2020 05/03/20 05/03/21  Samella Parr, NP     Vital Signs: BP 97/62 (BP Location: Right Arm)   Pulse 61   Temp 98.1 F (36.7 C) (Oral)   Resp 16   Ht 5\' 6"  (1.676 m)   Wt 108 lb 11 oz (49.3 kg)   SpO2 99%   BMI 17.54 kg/m   Physical Exam Vitals reviewed.  Constitutional:      General: He is not in  acute distress.    Appearance: Normal appearance. He is not ill-appearing.  HENT:     Head: Normocephalic and atraumatic.  Cardiovascular:     Rate and Rhythm: Normal rate.  Pulmonary:     Effort: Pulmonary effort is normal.  Abdominal:     General: Abdomen is flat.     Palpations: Abdomen is soft.  Musculoskeletal:     Cervical back: Neck supple.  Skin:    General: Skin is warm and dry.     Coloration: Skin is not jaundiced or pale.     Comments: Positive right flank drain to gravity bag. Site is unremarkable with no erythema, edema, tenderness, bleeding or drainage. Suture and stat lock in place. Dressing is clean, dry, and intact.  100 ml of blood-tinged fluid noted in the gravity bag.  RN reports drain is working well.  Positive left flank drain to gravity bag. Site is unremarkable with no erythema, edema, tenderness, bleeding or drainage. Suture and stat lock in place. Dressing is clean, dry, and intact.  150 ml of clear yellow colored fluid noted in the gravity bag.  RN reports drain is working well.  Neurological:     Mental Status: He is alert and oriented to person, place, and time.  Psychiatric:        Mood and Affect: Mood normal.        Behavior: Behavior normal.        Judgment: Judgment normal.    Imaging: DG Chest 1 View  Result Date: 10/08/2020 CLINICAL DATA:  Central line placement. EXAM: CHEST  1 VIEW COMPARISON:  10/08/2020 at 5:23 a.m. FINDINGS: Patient is slightly rotated to the right. Left IJ central venous catheter has been advanced as tip is over the SVC horizontally oriented toward the lateral wall. Lungs are hypoinflated with persistent elevation of the right hemidiaphragm. No focal lobar consolidation, effusion or pneumothorax. Cardiomediastinal silhouette and remainder of the exam is unchanged. IMPRESSION: 1. Hypoinflation without acute cardiopulmonary disease. 2. Left IJ central venous catheter has been advanced with tip over the SVC horizontally oriented  toward the lateral wall. Electronically Signed   By: Marin Olp M.D.   On: 10/08/2020 13:27   IR US Guidance  Result Date: 10/08/2020 INDICATION: 55 year old gentleman with worsening renal failure was found to have severe bilateral hydronephrosis despite bilateral ureteral stents in place. Interventional radiology consulted for bilateral percutaneous nephrostomy drain placement. EXAM: Bilateral nephrostomy drain placement utilizing ultrasound and fluoroscopic guidance COMPARISON:  None. MEDICATIONS: 2 g Rocephin IV; The antibiotic was administered in an appropriate time frame prior to skin puncture. ANESTHESIA/SEDATION: Fentanyl 50 mcg IV; Versed 2 mg IV Moderate Sedation Time:  32 minutes The patient was continuously monitored during the procedure by the interventional radiology nurse under my direct supervision. FLUOROSCOPY TIME:  Fluoroscopy Time: 1 minutes 36 seconds (3.5 mGy). COMPLICATIONS: None immediate. PROCEDURE: Informed written consent was obtained from  the patient after a thorough discussion of the procedural risks, benefits and alternatives. All questions were addressed. Maximal Sterile Barrier Technique was utilized including caps, mask, sterile gowns, sterile gloves, sterile drape, hand hygiene and skin antiseptic. A timeout was performed prior to the initiation of the procedure. Bilateral flank skin prepped and draped in usual fashion. Sterile ultrasound probe cover and gel utilized throughout the procedure. Utilizing continuous ultrasound guidance, left midpole calyx was accessed with a 21 gauge needle. 21 gauge needle exchanged for transitional dilator set over 0.035 inch guidewire. Transitional dilator set exchanged for 10 French multipurpose pigtail drain over 0.035 inch guidewire. Drain secured to skin with suture and connected to bag. Contrast administered through the drain under fluoroscopy opacified the left renal collecting system and confirmed appropriate placement. Utilizing  continuous ultrasound guidance, right lower pole calyx was accessed with a 21 gauge needle. 21 gauge needle exchanged for transitional dilator set over 0.035 inch guidewire. Transitional dilator set exchanged for 10 French multipurpose pigtail drain over 0.035 inch guidewire. Drain secured to skin with suture and connected to bag. Contrast administered through the drain under fluoroscopy opacified the right renal collecting system and confirmed appropriate placement. Purulent urine was aspirated from both renal collecting systems. Samples were sent for Gram stain and culture. IMPRESSION: Bilateral 10 French nephrostomy drains placed utilizing fluoroscopic and ultrasound guidance. Electronically Signed   By: Miachel Roux M.D.   On: 10/08/2020 13:08   IR US Guidance  Result Date: 10/08/2020 INDICATION: 55 year old gentleman with worsening renal failure was found to have severe bilateral hydronephrosis despite bilateral ureteral stents in place. Interventional radiology consulted for bilateral percutaneous nephrostomy drain placement. EXAM: Bilateral nephrostomy drain placement utilizing ultrasound and fluoroscopic guidance COMPARISON:  None. MEDICATIONS: 2 g Rocephin IV; The antibiotic was administered in an appropriate time frame prior to skin puncture. ANESTHESIA/SEDATION: Fentanyl 50 mcg IV; Versed 2 mg IV Moderate Sedation Time:  32 minutes The patient was continuously monitored during the procedure by the interventional radiology nurse under my direct supervision. FLUOROSCOPY TIME:  Fluoroscopy Time: 1 minutes 36 seconds (3.5 mGy). COMPLICATIONS: None immediate. PROCEDURE: Informed written consent was obtained from the patient after a thorough discussion of the procedural risks, benefits and alternatives. All questions were addressed. Maximal Sterile Barrier Technique was utilized including caps, mask, sterile gowns, sterile gloves, sterile drape, hand hygiene and skin antiseptic. A timeout was performed  prior to the initiation of the procedure. Bilateral flank skin prepped and draped in usual fashion. Sterile ultrasound probe cover and gel utilized throughout the procedure. Utilizing continuous ultrasound guidance, left midpole calyx was accessed with a 21 gauge needle. 21 gauge needle exchanged for transitional dilator set over 0.035 inch guidewire. Transitional dilator set exchanged for 10 French multipurpose pigtail drain over 0.035 inch guidewire. Drain secured to skin with suture and connected to bag. Contrast administered through the drain under fluoroscopy opacified the left renal collecting system and confirmed appropriate placement. Utilizing continuous ultrasound guidance, right lower pole calyx was accessed with a 21 gauge needle. 21 gauge needle exchanged for transitional dilator set over 0.035 inch guidewire. Transitional dilator set exchanged for 10 French multipurpose pigtail drain over 0.035 inch guidewire. Drain secured to skin with suture and connected to bag. Contrast administered through the drain under fluoroscopy opacified the right renal collecting system and confirmed appropriate placement. Purulent urine was aspirated from both renal collecting systems. Samples were sent for Gram stain and culture. IMPRESSION: Bilateral 10 French nephrostomy drains placed utilizing fluoroscopic and ultrasound guidance.  Electronically Signed   By: Miachel Roux M.D.   On: 10/08/2020 13:08   DG Chest Port 1 View  Result Date: 10/08/2020 CLINICAL DATA:  Pneumomediastinum. EXAM: PORTABLE CHEST 1 VIEW COMPARISON:  Chest CT dated 10/07/2020 and chest x-ray dated 10/07/2020. FINDINGS: Heart size and mediastinal contours are stable. Subtle evidence of mediastinum along the LEFT heart border. Lungs are clear. No pleural effusion or pneumothorax is seen. LEFT IJ central line is stable in position with tip at the level of the upper SVC. IMPRESSION: 1. Subtle evidence of pneumomediastinum along the LEFT heart  border. There has certainly been no increase in the degree of pneumomediastinum compared to yesterday's exams. 2. Lungs are clear. No pleural effusion or pneumothorax seen. Electronically Signed   By: Franki Cabot M.D.   On: 10/08/2020 08:42   DG CHEST PORT 1 VIEW  Result Date: 10/07/2020 CLINICAL DATA:  Status post central line placement EXAM: PORTABLE CHEST 1 VIEW COMPARISON:  10/06/2020 FINDINGS: Cardiac shadow is stable. Left jugular temporary dialysis catheter is noted with the tip in the proximal superior vena cava. No pneumothorax is noted. Lungs are clear. No bony abnormality is noted. IMPRESSION: No pneumothorax following temporary dialysis catheter placement. Electronically Signed   By: Inez Catalina M.D.   On: 10/07/2020 19:51   IR NEPHROSTOMY PLACEMENT LEFT  Result Date: 10/08/2020 INDICATION: 55 year old gentleman with worsening renal failure was found to have severe bilateral hydronephrosis despite bilateral ureteral stents in place. Interventional radiology consulted for bilateral percutaneous nephrostomy drain placement. EXAM: Bilateral nephrostomy drain placement utilizing ultrasound and fluoroscopic guidance COMPARISON:  None. MEDICATIONS: 2 g Rocephin IV; The antibiotic was administered in an appropriate time frame prior to skin puncture. ANESTHESIA/SEDATION: Fentanyl 50 mcg IV; Versed 2 mg IV Moderate Sedation Time:  32 minutes The patient was continuously monitored during the procedure by the interventional radiology nurse under my direct supervision. FLUOROSCOPY TIME:  Fluoroscopy Time: 1 minutes 36 seconds (3.5 mGy). COMPLICATIONS: None immediate. PROCEDURE: Informed written consent was obtained from the patient after a thorough discussion of the procedural risks, benefits and alternatives. All questions were addressed. Maximal Sterile Barrier Technique was utilized including caps, mask, sterile gowns, sterile gloves, sterile drape, hand hygiene and skin antiseptic. A timeout was  performed prior to the initiation of the procedure. Bilateral flank skin prepped and draped in usual fashion. Sterile ultrasound probe cover and gel utilized throughout the procedure. Utilizing continuous ultrasound guidance, left midpole calyx was accessed with a 21 gauge needle. 21 gauge needle exchanged for transitional dilator set over 0.035 inch guidewire. Transitional dilator set exchanged for 10 French multipurpose pigtail drain over 0.035 inch guidewire. Drain secured to skin with suture and connected to bag. Contrast administered through the drain under fluoroscopy opacified the left renal collecting system and confirmed appropriate placement. Utilizing continuous ultrasound guidance, right lower pole calyx was accessed with a 21 gauge needle. 21 gauge needle exchanged for transitional dilator set over 0.035 inch guidewire. Transitional dilator set exchanged for 10 French multipurpose pigtail drain over 0.035 inch guidewire. Drain secured to skin with suture and connected to bag. Contrast administered through the drain under fluoroscopy opacified the right renal collecting system and confirmed appropriate placement. Purulent urine was aspirated from both renal collecting systems. Samples were sent for Gram stain and culture. IMPRESSION: Bilateral 10 French nephrostomy drains placed utilizing fluoroscopic and ultrasound guidance. Electronically Signed   By: Miachel Roux M.D.   On: 10/08/2020 13:08   IR NEPHROSTOMY PLACEMENT RIGHT  Result Date:  10/08/2020 INDICATION: 55 year old gentleman with worsening renal failure was found to have severe bilateral hydronephrosis despite bilateral ureteral stents in place. Interventional radiology consulted for bilateral percutaneous nephrostomy drain placement. EXAM: Bilateral nephrostomy drain placement utilizing ultrasound and fluoroscopic guidance COMPARISON:  None. MEDICATIONS: 2 g Rocephin IV; The antibiotic was administered in an appropriate time frame prior to  skin puncture. ANESTHESIA/SEDATION: Fentanyl 50 mcg IV; Versed 2 mg IV Moderate Sedation Time:  32 minutes The patient was continuously monitored during the procedure by the interventional radiology nurse under my direct supervision. FLUOROSCOPY TIME:  Fluoroscopy Time: 1 minutes 36 seconds (3.5 mGy). COMPLICATIONS: None immediate. PROCEDURE: Informed written consent was obtained from the patient after a thorough discussion of the procedural risks, benefits and alternatives. All questions were addressed. Maximal Sterile Barrier Technique was utilized including caps, mask, sterile gowns, sterile gloves, sterile drape, hand hygiene and skin antiseptic. A timeout was performed prior to the initiation of the procedure. Bilateral flank skin prepped and draped in usual fashion. Sterile ultrasound probe cover and gel utilized throughout the procedure. Utilizing continuous ultrasound guidance, left midpole calyx was accessed with a 21 gauge needle. 21 gauge needle exchanged for transitional dilator set over 0.035 inch guidewire. Transitional dilator set exchanged for 10 French multipurpose pigtail drain over 0.035 inch guidewire. Drain secured to skin with suture and connected to bag. Contrast administered through the drain under fluoroscopy opacified the left renal collecting system and confirmed appropriate placement. Utilizing continuous ultrasound guidance, right lower pole calyx was accessed with a 21 gauge needle. 21 gauge needle exchanged for transitional dilator set over 0.035 inch guidewire. Transitional dilator set exchanged for 10 French multipurpose pigtail drain over 0.035 inch guidewire. Drain secured to skin with suture and connected to bag. Contrast administered through the drain under fluoroscopy opacified the right renal collecting system and confirmed appropriate placement. Purulent urine was aspirated from both renal collecting systems. Samples were sent for Gram stain and culture. IMPRESSION: Bilateral  10 French nephrostomy drains placed utilizing fluoroscopic and ultrasound guidance. Electronically Signed   By: Miachel Roux M.D.   On: 10/08/2020 13:08   ECHOCARDIOGRAM LIMITED  Result Date: 10/08/2020    ECHOCARDIOGRAM LIMITED REPORT   Patient Name:   Chase [name] Date of Exam: 10/08/2020 Medical Rec #:  [id number]       Height:       66.0 in Accession #:    [id number]      Weight:       119.3 lb Date of Birth:  1965/12/04       BSA:          1.605 m Patient Age:    55 years        BP:           126/66 mmHg Patient Gender: M               HR:           61 bpm. Exam Location:  Inpatient Procedure: Limited Echo Indications:    Abnormal ECG  History:        Patient has prior history of Echocardiogram examinations, most                 recent 06/28/2020. Previous Myocardial Infarction. H/O stroke and                 COVID-19. CKD. GERD. Cancer.  Sonographer:    Clayton Lefort RDCS (AE) Referring Phys: [phone] [facility] D HARRIS  Sonographer Comments:  Unable to find parasternal or apical imaging windows. Subcostal area covered with ostomy bag and bandages.  Uninterpretable study. Sanda Klein MD Electronically signed by Sanda Klein MD Signature Date/Time: 10/08/2020/3:10:26 PM    Final     Labs:  CBC: Recent Labs    10/08/20 0309 10/08/20 1511 10/09/20 0302 10/09/20 1355 10/10/20 0330  WBC 4.3 4.1 7.5  --  8.5  HGB 7.2* 9.2* 6.7* 6.6* 7.6*  HCT 20.9* 26.3* 19.5* 20.0* 22.3*  PLT 174 162 137*  --  106*    COAGS: Recent Labs    10/17/19 1241 10/17/19 1311 11/07/19 1836 11/07/19 1853 11/10/19 1052 01/26/20 1050 03/31/20 1420 04/16/20 1354 10/08/20 0811  INR 1.5* 1.6*   < >  --    < > 1.2 1.5* 1.0 1.1  APTT 43* 46*  --  31  --   --  37*  --   --    < > = values in this interval not displayed.    BMP: Recent Labs    01/01/20 0430 01/02/20 0427 01/03/20 0506 01/04/20 0453 01/05/20 0558 10/09/20 1355 10/10/20 0309 10/10/20 1801 10/11/20 0319  NA 143 144 143 143   < > 136 135 136  135  K 3.5 3.9 3.9 3.9   < > 3.8 3.7 3.6 3.7  CL 116* 113* 112* 112*   < > 104 103 102 106  CO2 18* 19* 22 23   < > 25 25 24 22   GLUCOSE 73 84 81 77   < > 122* 108* 102* 99  BUN 60* 61* 53* 47*   < > 16 20 28* 28*  CALCIUM 7.3* 7.5* 7.5* 7.4*   < > 6.9* 6.9* 6.8* 6.5*  CREATININE 6.54* 6.27* 6.42* 5.74*   < > 2.28* 3.07* 3.84* 4.05*  GFRNONAA 9* 9* 9* 10*   < > 33* 23* 18* 16*  GFRAA 10* 11* 10* 12*  --   --   --   --   --    < > = values in this interval not displayed.    LIVER FUNCTION TESTS: Recent Labs    07/06/20 0456 07/07/20 0605 10/06/20 1805 10/07/20 0535 10/07/20 1719 10/09/20 1355 10/10/20 0309 10/10/20 1801 10/11/20 0319  BILITOT 0.7 0.6 0.9 1.1  --   --   --   --   --   AST 21 17 14* 16  --   --   --   --   --   ALT 14 12 54* 40  --   --   --   --   --   ALKPHOS 81 77 129* 94  --   --   --   --   --   PROT 4.4* 4.3* 7.6 5.8*  --   --   --   --   --   ALBUMIN 1.7*  1.7* 1.7*  1.6* 3.4* 2.7*   < > 2.0* 1.9* 2.0* 1.8*   < > = values in this interval not displayed.    Assessment and Plan:  55 year old male with history of Hodgkin's lymphoma, CVA, BPH with urinary retention s/p chronic Foley catheter and bilateral double-J stent and bilateral PCN placement; s/p bilateral PCN removal on 04/25/2020, who presented to [facility] ED with chief complaint of AMS, found to have bilateral hydronephrosis and AKI, s/p bilateral PCN placement with IR on 10/08/2020.    Pt stable, PCNs intact, working well per [employer]. Puncture site unremarkable, no s/s of bleeding or infection.  OP right PCN 100 cc blood-tinged urine; left PCN 800 clear yellow urine. VSS RF slightly worse today, BUN 28 (28 yesterday), creatinine 4.05 today (3.84 yesterday), GFR 16 (18 yesterday) Cx left kidney showed abundant WBC and abundant gram variable rod, being reintubated for better growth.  Continue with flushing TID, output recording q shift and dressing changes as needed.   Further treatment plan per  [facility]/urology/nephrology/ Appreciate and agree with the plan.  IR to follow remotely.   Electronically Signed: Tera Mater, PA-C 10/11/2020, 11:12 AM   I spent a total of 25 Minutes at the the patient's bedside AND on the patient's hospital floor or unit, greater than 50% of which was counseling/coordinating care for bilateral PCN

## 2020-10-11 NOTE — Progress Notes (Signed)
PT Cancellation Note  Patient Details Name: Chase [name] MRN: [id number] DOB: 1965-11-30   Cancelled Treatment:    Reason Eval/Treat Not Completed: (P) Medical issues which prohibited therapy Pt off floor for HD. PT will follow back for Evaluation tomorrow.   Jonay Hitchcock B. Migdalia Dk PT, DPT Acute Rehabilitation Services Pager [phone] Office [phone]    [place] 10/11/2020, 12:22 PM

## 2020-10-11 NOTE — Progress Notes (Signed)
Pt came to HD unit from floor  Dialyzed ~1L, some soft pressures but overall very stable, asymptomatic. Complaints of pain. Pt tolerated well, catheter filled with heparin 1.4 cc, stable for transfer back to room  Labs sent per orders, and also for hep B

## 2020-10-11 NOTE — Progress Notes (Signed)
PROGRESS NOTE    Chase [name]  FUX:[id number] DOB: 25-May-1965 DOA: 10/06/2020 PCP: [name]   Brief Narrative:  Chase [name] is a 55 y.o. male with a PMH significant for CKD stage V, hx of GIB and ulcers requiring gastric bypass, prior stroke, hodgkin's lymphoma, prior MI, cervical radiculopathy, chronic pain syndrome, COVID, and PTSD who presented to the ED via EMS after being found minimally responsive on welfare check. On EMS arrival patient was found in recliner alert but confused. On chart review it appears patient was scheduled for pre op lab work 7/6 and TURP 7/7 but failed to show for either apt.   Patient has an extensive past medical history to include CKD stage 5 that progressed to acute renal failure march of this year and required CRRT. He also has indwelling catheter and bilateral nephrostomy tubes for hydronephrosis. He presents altered but in stable condition with no immediate needs for hemodialysis. Acidosis has improved with bicarb drip. Nephrology has already been consulted.  Assessment & Plan:   Principal Problem:   Acute renal failure superimposed on stage 4 chronic kidney disease (HCC) Active Problems:   BPH (benign prostatic hyperplasia)   Postlaminectomy syndrome, not elsewhere classified   PTSD (post-traumatic stress disorder)   AKI (acute kidney injury) ([place])   Anxiety and depression   Anemia in chronic kidney disease   Acute metabolic encephalopathy   Acute renal failure (ARF) (HCC)   Acute on chronic renal failure - CKD-V  - Related to some issue with GU drainage and presumed worsening prostatic/obstruction given outpatient plan TURP and missed appointment - Status post bilateral nephrostomy tubes - previously requiring CRRT (off as of 10/09/20).  Further urological plans hinge on whether there is renal recovery per nephrology - UOP minimally improving  Acute anemia- received 2 units pRBC 7/11, hgb 7.6 this am.  No s/s of bleeding.  If  continues to drop would CT abdomen.  Failure to thrive, severe protein calorie malnutrition -Continue to encourage PO intake - liberalize diet and increase PT/OT  Hypotension: related to chronic wasting and acute acidemia, improved, off pressors. Now on midodrine.  Recurrent diverticulitis with abscess formation leading to complex urological obstruction- s/p diverting ostomy with [facility] resection of redundant sigmoid colon.  ? If he will ever be candidate for re-anastamosis to rectal stump.  DVT prophylaxis: holding in the setting of anemia Code Status: DNR Family Communication: None present  Status is: Inpt  Dispo: The patient is from: Home              Anticipated d/c is to: TBD - requesting DC home when possible              Anticipated d/c date is: >50h              Patient currently NOT medically stable for discharge  Consultants:  Nephrology, Urology  Antimicrobials:  None   Subjective: No acute issues/events overnight - somewhat flat affect this am - ROS negative; patient's complaint focuses mainly on food and disposition (when can he go home)  Objective: Vitals:   10/10/20 1930 10/10/20 2015 10/11/20 0500 10/11/20 0512  BP: 92/60 95/61  108/65  Pulse:    67  Resp: 16 18  16   Temp:  97.9 F (36.6 C)  97.7 F (36.5 C)  TempSrc:  Oral    SpO2:  100%    Weight:   49.3 kg   Height:        Intake/Output Summary (  Last 24 hours) at 10/11/2020 0800 Last data filed at 10/11/2020 0600 Gross per 24 hour  Intake --  Output 1500 ml  Net -1500 ml   Filed Weights   10/09/20 0615 10/10/20 0300 10/11/20 0500  Weight: 51 kg 46.4 kg 49.3 kg    Examination:  General:  Pleasantly resting in bed, No acute distress. HEENT:  Normocephalic atraumatic.  Sclerae nonicteric, noninjected.  Extraocular movements intact bilaterally. Neck:  Without mass or deformity.  Trachea is midline. Lungs:  Clear to auscultate bilaterally without rhonchi, wheeze, or rales. Heart:  Regular  rate and rhythm.  Without murmurs, rubs, or gallops. Abdomen:  Soft, nontender, nondistended.  Without guarding or rebound. Nephrostomy tube - dark yellow output; ostomy in place Extremities: Without cyanosis, clubbing, edema, or obvious deformity. Vascular:  Dorsalis pedis and posterior tibial pulses palpable bilaterally. Skin:  Warm and dry, no erythema, no ulcerations.   Data Reviewed: I have personally reviewed following labs and imaging studies  CBC: Recent Labs  Lab 10/06/20 1805 10/07/20 0535 10/08/20 0309 10/08/20 1511 10/09/20 0302 10/09/20 1355 10/10/20 0330  WBC 6.0 3.8* 4.3 4.1 7.5  --  8.5  NEUTROABS 4.7  --   --   --   --   --   --   HGB 9.3* 7.5* 7.2* 9.2* 6.7* 6.6* 7.6*  HCT 29.0* 21.7* 20.9* 26.3* 19.5* 20.0* 22.3*  MCV 97.3 93.9 90.5 91.3 92.0  --  91.8  PLT 233 202 174 162 137*  --  427*   Basic Metabolic Panel: Recent Labs  Lab 10/08/20 0309 10/08/20 1516 10/09/20 0302 10/09/20 1355 10/10/20 0309 10/10/20 1801 10/11/20 0319  NA 139   < > 136 136 135 136 135  K 3.2*   < > 4.1 3.8 3.7 3.6 3.7  CL 108   < > 102 104 103 102 106  CO2 25   < > 25 25 25 24 22   GLUCOSE 112*   < > 100* 122* 108* 102* 99  BUN 43*   < > 17 16 20  28* 28*  CREATININE 4.14*   < > 2.17* 2.28* 3.07* 3.84* 4.05*  CALCIUM 7.1*   < > 7.2* 6.9* 6.9* 6.8* 6.5*  MG 2.4  --  2.2  --  2.2  --  2.4  PHOS 2.4*   < > 3.5 4.0 4.3 5.3* 5.6*   < > = values in this interval not displayed.   GFR: Estimated Creatinine Clearance: 14.2 mL/min (A) (by C-G formula based on SCr of 4.05 mg/dL (H)). Liver Function Tests: Recent Labs  Lab 10/06/20 1805 10/07/20 0535 10/07/20 1719 10/09/20 0302 10/09/20 1355 10/10/20 0309 10/10/20 1801 10/11/20 0319  AST 14* 16  --   --   --   --   --   --   ALT 54* 40  --   --   --   --   --   --   ALKPHOS 129* 94  --   --   --   --   --   --   BILITOT 0.9 1.1  --   --   --   --   --   --   PROT 7.6 5.8*  --   --   --   --   --   --   ALBUMIN 3.4* 2.7*    < > 2.2* 2.0* 1.9* 2.0* 1.8*   < > = values in this interval not displayed.   No results for input(s): LIPASE,  AMYLASE in the last 168 hours. No results for input(s): AMMONIA in the last 168 hours. Coagulation Profile: Recent Labs  Lab 10/08/20 0811  INR 1.1   Cardiac Enzymes: No results for input(s): CKTOTAL, CKMB, CKMBINDEX, TROPONINI in the last 168 hours. BNP (last 3 results) No results for input(s): PROBNP in the last 8760 hours. HbA1C: No results for input(s): HGBA1C in the last 72 hours. CBG: Recent Labs  Lab 10/06/20 1905 10/09/20 1957  GLUCAP 115* 121*   Lipid Profile: No results for input(s): CHOL, HDL, LDLCALC, TRIG, CHOLHDL, LDLDIRECT in the last 72 hours. Thyroid Function Tests: No results for input(s): TSH, T4TOTAL, FREET4, T3FREE, THYROIDAB in the last 72 hours. Anemia Panel: No results for input(s): VITAMINB12, FOLATE, FERRITIN, TIBC, IRON, RETICCTPCT in the last 72 hours. Sepsis Labs: Recent Labs  Lab 10/06/20 1746 10/06/20 2312 10/07/20 0535 10/08/20 0309  PROCALCITON  --  0.17 0.17 0.13  LATICACIDVEN 1.0  --   --   --     Recent Results (from the past 240 hour(s))  SARS CORONAVIRUS 2 (TAT 6-24 HRS) Nasopharyngeal Nasopharyngeal Swab     Status: None   Collection Time: 10/03/20 10:20 AM   Specimen: Nasopharyngeal Swab  Result Value Ref Range Status   SARS Coronavirus 2 NEGATIVE NEGATIVE Final    Comment: (NOTE) SARS-CoV-2 target nucleic acids are NOT DETECTED.  The SARS-CoV-2 RNA is generally detectable in upper and lower respiratory specimens during the acute phase of infection. Negative results do not preclude SARS-CoV-2 infection, do not rule out co-infections with other pathogens, and should not be used as the sole basis for treatment or other patient management decisions. Negative results must be combined with clinical observations, patient history, and epidemiological information. The expected result is Negative.  Fact Sheet for  Patients: SugarRoll.be  Fact Sheet for Healthcare Providers: https://www.woods-mathews.com/  This test is not yet approved or cleared by the [place] FDA and  has been authorized for detection and/or diagnosis of SARS-CoV-2 by FDA under an Emergency Use Authorization (EUA). This EUA will remain  in effect (meaning this test can be used) for the duration of the COVID-19 declaration under Se ction 564(b)(1) of the Act, 21 U.S.C. section 360bbb-3(b)(1), unless the authorization is terminated or revoked sooner.  Performed at [facility], [place] [address] [place], [place] [address]   Culture, blood (routine x 2)     Status: None   Collection Time: 10/06/20  5:47 PM   Specimen: BLOOD RIGHT FOREARM  Result Value Ref Range Status   Specimen Description BLOOD RIGHT FOREARM  Final   Special Requests   Final    Blood Culture results may not be optimal due to an inadequate volume of blood received in culture bottles BOTTLES DRAWN AEROBIC AND ANAEROBIC   Culture   Final    NO GROWTH 5 DAYS Performed at [facility], [address], [place], [place] [address]    Report Status 10/11/2020 FINAL  Final  Culture, blood (routine x 2)     Status: None   Collection Time: 10/06/20  6:06 PM   Specimen: BLOOD RIGHT ARM  Result Value Ref Range Status   Specimen Description BLOOD RIGHT ARM  Final   Special Requests   Final    BOTTLES DRAWN AEROBIC AND ANAEROBIC Blood Culture adequate volume   Culture   Final    NO GROWTH 5 DAYS Performed at [facility], [address], [place], [place] [address]    Report Status 10/11/2020 FINAL  Final  Urine culture     Status: Abnormal   Collection Time: 10/06/20  7:31 PM   Specimen: Urine, Clean Catch  Result Value Ref Range Status   Specimen Description   Final    URINE, CLEAN CATCH Performed at [facility], [address] [place], [place] [address]    Special Requests   Final    NONE Performed at [facility], [address], [place], [place] [address]    Culture MULTIPLE SPECIES PRESENT, SUGGEST RECOLLECTION (A)  Final   Report Status 10/08/2020 FINAL  Final  Resp Panel by RT-PCR (Flu A&B, Covid) Nasopharyngeal Swab     Status: None   Collection Time: 10/06/20  8:10 PM   Specimen: Nasopharyngeal Swab; Nasopharyngeal(NP) swabs in vial transport medium  Result Value Ref Range Status   SARS Coronavirus 2 by RT PCR NEGATIVE NEGATIVE Final    Comment: (NOTE) SARS-CoV-2 target nucleic acids are NOT DETECTED.  The SARS-CoV-2 RNA is generally detectable in upper respiratory specimens during the acute phase of infection. The lowest concentration of SARS-CoV-2 viral copies this assay can detect is 138 copies/mL. A negative result does not preclude SARS-Cov-2 infection and should not be used as the sole basis for treatment or other patient management decisions. A negative result may occur with  improper specimen collection/handling, submission of specimen other than nasopharyngeal swab, presence of viral mutation(s) within the areas targeted by this assay, and inadequate number of viral copies(<138 copies/mL). A negative result must be combined with clinical observations, patient history, and epidemiological information. The expected result is Negative.  Fact Sheet for Patients:  EntrepreneurPulse.com.au  Fact Sheet for Healthcare Providers:  IncredibleEmployment.be  This test is no t yet approved or cleared by the [place] FDA and  has been authorized for detection and/or diagnosis of SARS-CoV-2 by FDA under an Emergency Use Authorization (EUA). This EUA will remain  in effect (meaning this test can be used) for the duration of the COVID-19 declaration under Section 564(b)(1) of the Act, 21 U.S.C.section 360bbb-3(b)(1), unless the authorization is terminated  or revoked sooner.       Influenza A by PCR NEGATIVE NEGATIVE Final   Influenza B by  PCR NEGATIVE NEGATIVE Final    Comment: (NOTE) The Xpert Xpress SARS-CoV-2/FLU/RSV plus assay is intended as an aid in the diagnosis of influenza from Nasopharyngeal swab specimens and should not be used as a sole basis for treatment. Nasal washings and aspirates are unacceptable for Xpert Xpress SARS-CoV-2/FLU/RSV testing.  Fact Sheet for Patients: EntrepreneurPulse.com.au  Fact Sheet for Healthcare Providers: IncredibleEmployment.be  This test is not yet approved or cleared by the [place] FDA and has been authorized for detection and/or diagnosis of SARS-CoV-2 by FDA under an Emergency Use Authorization (EUA). This EUA will remain in effect (meaning this test can be used) for the duration of the COVID-19 declaration under Section 564(b)(1) of the Act, 21 U.S.C. section 360bbb-3(b)(1), unless the authorization is terminated or revoked.  Performed at [facility], [address] [place], [place] [address]   MRSA Next Gen by PCR, Nasal     Status: None   Collection Time: 10/07/20  1:37 PM   Specimen: Nasal Mucosa; Nasal Swab  Result Value Ref Range Status   MRSA by PCR Next Gen NOT DETECTED NOT DETECTED Final    Comment: (NOTE) The GeneXpert MRSA Assay (FDA approved for NASAL specimens only), is one component of a comprehensive MRSA colonization surveillance program. It is not intended to diagnose MRSA  infection nor to guide or monitor treatment for MRSA infections. Test performance is not FDA approved in patients less than 57 years old. Performed at [facility], [place] [address], [place], [place] [address]   Aerobic/Anaerobic Culture w Gram Stain (surgical/deep wound)     Status: None (Preliminary result)   Collection Time: 10/08/20 12:02 PM   Specimen: Urine, Random  Result Value Ref Range Status   Specimen Description URINE, RANDOM  Final   Special Requests LEFT KIDNEY  Final   Gram Stain   Final    ABUNDANT WBC PRESENT,BOTH  PMN AND MONONUCLEAR ABUNDANT GRAM VARIABLE ROD    Culture   Final    CULTURE REINCUBATED FOR BETTER GROWTH Performed at [facility], [place] [address] [place], [place] [address]    Report Status PENDING  Incomplete    Radiology Studies: No results found.  Scheduled Meds:  calcium carbonate  1 tablet Oral TID WC   Chlorhexidine Gluconate Cloth  6 each Topical Daily   Chlorhexidine Gluconate Cloth  6 each Topical Q0600   darbepoetin (ARANESP) injection - DIALYSIS  60 mcg Intravenous Q Mon-HD   mouth rinse  15 mL Mouth Rinse BID   midodrine  10 mg Oral Q T,Th,Sa-HD   multivitamin with minerals  1 tablet Oral BID   pantoprazole  40 mg Oral Q1200   sodium chloride flush  5 mL Intracatheter Q8H   Continuous Infusions:  sodium chloride 250 mL (10/09/20 1732)   norepinephrine (LEVOPHED) Adult infusion Stopped (10/08/20 0401)     LOS: 5 days   Time spent: 47 min  Little Ishikawa, DO [facility]  If 7PM-7AM, please contact night-coverage [email or web address]  10/11/2020, 8:00 AM

## 2020-10-11 NOTE — Plan of Care (Signed)

## 2020-10-11 NOTE — Progress Notes (Signed)
Palliative: [name] is lying quietly in bed.  He appears acutely/chronically ill and frail, cachectic with temporal wasting.  He is alert and oriented, able to make his basic needs known.  There is no family at bedside at this time.  Somewhat limited conversation today as [name] tells me that he is tired, and would like to rest.  We briefly talk about his kidney function/labs. He shares that he will take dialysis if needed, but if not needed that is okay, also.  He tells me today that he would never go to short-term rehab even at a [place] facility.  He tells me that he must go home.  He shares that he has discussed this with his sons and they have "come to an agreement".  Conference with attending, bedside nursing staff, transition of care team related to patient condition, needs, goals of care, disposition.  Plan: Continue to treat the treatable but no CPR or intubation.  Agreeable to hemodialysis as needed/offered.  At this point continues to state he will return home, declines short-term rehab.  25 minutes [name], NP Palliative medicine team Team phone [phone] Greater than 50% of this time was spent counseling and coordinating care related to the above assessment and plan.

## 2020-10-12 DIAGNOSIS — Z515 Encounter for palliative care: Secondary | ICD-10-CM | POA: Diagnosis not present

## 2020-10-12 DIAGNOSIS — N179 Acute kidney failure, unspecified: Secondary | ICD-10-CM | POA: Diagnosis not present

## 2020-10-12 DIAGNOSIS — G9341 Metabolic encephalopathy: Secondary | ICD-10-CM | POA: Diagnosis not present

## 2020-10-12 DIAGNOSIS — N185 Chronic kidney disease, stage 5: Secondary | ICD-10-CM | POA: Diagnosis not present

## 2020-10-12 DIAGNOSIS — Z7189 Other specified counseling: Secondary | ICD-10-CM | POA: Diagnosis not present

## 2020-10-12 DIAGNOSIS — F419 Anxiety disorder, unspecified: Secondary | ICD-10-CM | POA: Diagnosis not present

## 2020-10-12 LAB — CBC
HCT: 23.4 % — ABNORMAL LOW (ref 39.0–52.0)
Hemoglobin: 7.7 g/dL — ABNORMAL LOW (ref 13.0–17.0)
MCH: 31.2 pg (ref 26.0–34.0)
MCHC: 32.9 g/dL (ref 30.0–36.0)
MCV: 94.7 fL (ref 80.0–100.0)
Platelets: 159 10*3/uL (ref 150–400)
RBC: 2.47 MIL/uL — ABNORMAL LOW (ref 4.22–5.81)
RDW: 16.4 % — ABNORMAL HIGH (ref 11.5–15.5)
WBC: 7.2 10*3/uL (ref 4.0–10.5)
nRBC: 0 % (ref 0.0–0.2)

## 2020-10-12 LAB — RENAL FUNCTION PANEL
Albumin: 1.9 g/dL — ABNORMAL LOW (ref 3.5–5.0)
Albumin: 1.9 g/dL — ABNORMAL LOW (ref 3.5–5.0)
Anion gap: 7 (ref 5–15)
Anion gap: 8 (ref 5–15)
BUN: 17 mg/dL (ref 6–20)
BUN: 22 mg/dL — ABNORMAL HIGH (ref 6–20)
CO2: 25 mmol/L (ref 22–32)
CO2: 22 mmol/L (ref 22–32)
Calcium: 6.6 mg/dL — ABNORMAL LOW (ref 8.9–10.3)
Calcium: 6.4 mg/dL — CL (ref 8.9–10.3)
Chloride: 103 mmol/L (ref 98–111)
Chloride: 106 mmol/L (ref 98–111)
Creatinine, Ser: 3.11 mg/dL — ABNORMAL HIGH (ref 0.61–1.24)
Creatinine, Ser: 3.79 mg/dL — ABNORMAL HIGH (ref 0.61–1.24)
GFR, Estimated: 23 mL/min — ABNORMAL LOW (ref >60)
GFR, Estimated: 18 mL/min — ABNORMAL LOW (ref >60)
Glucose, Bld: 102 mg/dL — ABNORMAL HIGH (ref 70–99)
Glucose, Bld: 208 mg/dL — ABNORMAL HIGH (ref 70–99)
Phosphorus: 3.4 mg/dL (ref 2.5–4.6)
Phosphorus: 3.9 mg/dL (ref 2.5–4.6)
Potassium: 3.7 mmol/L (ref 3.5–5.1)
Potassium: 3.7 mmol/L (ref 3.5–5.1)
Sodium: 135 mmol/L (ref 135–145)
Sodium: 136 mmol/L (ref 135–145)

## 2020-10-12 LAB — CREATININE CLEARANCE, URINE, 24 HOUR
Collection Interval-CRCL: 24 hours
Creatinine Clearance: 3 mL/min — ABNORMAL LOW (ref 75–125)
Creatinine, 24H Ur: 154 mg/d — ABNORMAL LOW (ref 800–2000)
Creatinine, Urine: 19.24 mg/dL
Urine Total Volume-CRCL: 800 mL

## 2020-10-12 LAB — MAGNESIUM: Magnesium: 2 mg/dL (ref 1.7–2.4)

## 2020-10-12 LAB — VITAMIN A: Vitamin A (Retinoic Acid): 22.7 ug/dL (ref 20.1–62.0)

## 2020-10-12 LAB — HEPATITIS B SURFACE ANTIBODY, QUANTITATIVE: Hep B S AB Quant (Post): <3.1 m[IU]/mL — ABNORMAL LOW (ref >9.9)

## 2020-10-12 LAB — ZINC: Zinc: 40 ug/dL — ABNORMAL LOW (ref 44–115)

## 2020-10-12 LAB — COPPER, SERUM: Copper: 65 ug/dL — ABNORMAL LOW (ref 69–132)

## 2020-10-12 MED ORDER — DARBEPOETIN ALFA 60 MCG/0.3ML IJ SOSY: 60.0000 ug | PREFILLED_SYRINGE | INTRAMUSCULAR | Status: DC

## 2020-10-12 NOTE — Progress Notes (Signed)
Patient ID: Chase [name], male   DOB: [birth year], 56 y.o.   MRN: [id number]    Subjective: Pt did wind up receiving dialysis last evening.  Still unclear as to where renal function will settle out.  No new complaints.   Objective: Vital signs in last 24 hours: Temp:  [98.1 F (36.7 C)-99.2 F (37.3 C)] 98.2 F (36.8 C) (07/14 0455) Pulse Rate:  [55-61] 55 (07/14 0455) Resp:  [9-18] 18 (07/13 2043) BP: (81-109)/(48-68) 95/64 (07/14 0455) SpO2:  [97 %-99 %] 97 % (07/14 0455) Weight:  [51 kg-52 kg] 51 kg (07/13 1505)  Intake/Output from previous day: 07/13 0701 - 07/14 0700 In: 380 [P.O.:380] Out: 1651 [Urine:725] Intake/Output this shift: No intake/output data recorded.  Physical Exam:  General: Alert and oriented GU: Bilateral PCN draining well (left better than right)  Lab Results: Recent Labs    10/09/20 1355 10/10/20 0330 10/11/20 1100  HGB 6.6* 7.6* 7.3*  HCT 20.0* 22.3* 22.3*   BMET Recent Labs    10/11/20 0319 10/11/20 1555  NA 135 137  K 3.7 3.8  CL 106 102  CO2 22 28  GLUCOSE 99 90  BUN 28* 10  CREATININE 4.05* 1.99*  CALCIUM 6.5* 6.9*     Studies/Results: No results found.  Assessment/Plan: 1) Bilateral hydronephrosis: Unclear etiology.  His stents were last changed in April but he currently has bilateral nephrostomy tubes in place for optimal drainage and treatment of any obstructive component of his AKI.  Will continue to monitor renal function during hospital course to make more definitive recommendations.  Continue PCNs and Foley for now. Pending his recovery from his current acute illness, will determine if he would be a candidate to return to the OR for further evaluation of the etiology of his hydronephrosis/ongoing ureteral stent management.   2) Bladder outlet obstruction:  He was scheduled for TURP last week.  This will now need to delayed.  His catheter was changed on 10/08/20.  When/if he becomes medically optimized, can reconsider this  options to get him free of an indwelling catheter.  Do not anticipate that this will be soon.  Will likely have him follow up for catheter change in a month and re-evaluate surgical options at that time.   3) Pneumomediastinum: Not sure of this significance or cause for this.  Would likely need some definitive recommendations about this before considering any elective urologic procedure in the future.   4) Urologic disposition: Much of the decisions/goals of care from a urologic perspective will depend on both his renal function and urine output after his acute illness.  In addition, he will need to be medically optimized before considering any elective procedure.  He will need his stents changed or removed at some point over the next 4-6 weeks.  I did express to Chase [name] that, despite his prior objections, he would likely be best served by being discharged to a SNF or at least an assisted living facility to avoid recurrent hospitalizations and setbacks for acute illnesses that might have been avoided with more supportive care and assistance.   I will continue to follow him.   LOS: 6 days   [name] 10/12/2020, 7:25 AM

## 2020-10-12 NOTE — Progress Notes (Signed)
[facility] Progress Note  Name: Chase [name] MRN: [id number] DOB: July 17, 1965  Chief Complaint:  AMS  Subjective:  he had 460 mL uop over 7/11, 942m 7/12, 7236m7/13.  Had HD yesterday for clearance; 24h crcl started last night.  Feeling fine today - no uremic symptoms.    ------------- Background on referral: Chase RASKs a [age].o. 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  He was supposed to have a TURP late this week and didn't show up for procedure or pre-op labs.  He was then found to be unresponsive on a welfare check.  He has an indwelling foley.  Nephrology consulted.  [facility].  I last saw him in 01/02/20 inpatient and [facility] was [facility] after missing multiple appointments.  [facility] also [facility] He has had interim hospitalizations including one with CRRT.  At a SNF at one point but most recently at home.  Blood pressure has been low range 90's and HR has been 40's most recently from 50's earlier.  He has been on bicarb gtt.   Dr. FoRoyce Macadamiaad an extensive conversation with the patient, his son (present via phone), and his nurse.  We discussed the risks/benefits/indications of dialysis/RRT.  They do wish to pursue dialysis and consent for renal replacement therapy.  The patient wishes it hadn't come to this but is not ready to transition to comfort care.  He wants to live independently but his son discussed that that hadn't been going well.   Intake/Output Summary (Last 24 hours) at 10/12/2020 0749 Last data filed at 10/12/2020 0600 Gross per 24 hour  Intake 380 ml  Output 1651 ml  Net -1271 ml     Vitals:  Vitals:   10/11/20 1510 10/11/20 1541 10/11/20 2043 10/12/20 0455  BP: (!) 92/51 107/66 98/62 95/64   Pulse:   (!) 55 61 (!) 55  Resp: 17 15 18    Temp:  98.4 F (36.9 C) 99.2 F (37.3 C) 98.2 F (36.8 C)  TempSrc:  Oral Oral Oral  SpO2: 98% 98% 97% 97%  Weight:      Height:         Physical Exam:  General: thin chronically ill appearing gentleman  HEENT: [place] AT Eyes: EOMI, anicteric Neck: supple trachea midline Heart: RRR on monitor Lungs: normal WOB on RA Extremities: no edema  GU foley in place Access: left IJ nontunneled catheter  Medications reviewed   Labs:  BMP Latest Ref Rng & Units 10/11/2020 10/11/2020 10/10/2020  Glucose 70 - 99 mg/dL 90 99 102(H)  BUN 6 - 20 mg/dL 10 28(H) 28(H)  Creatinine 0.61 - 1.24 mg/dL 1.99(H) 4.05(H) 3.84(H)  Sodium 135 - 145 mmol/L 137 135 136  Potassium 3.5 - 5.1 mmol/L 3.8 3.7 3.6  Chloride 98 - 111 mmol/L 102 106 102  CO2 22 - 32 mmol/L 28 22 24   Calcium 8.9 - 10.3 mg/dL 6.9(L) 6.5(L) 6.8(L)     Assessment/Plan:   # AKI on CKD stage V - hx of advanced CKD and chronic obstruction and chronic indwelling foley.  Presented with severe AKI requiring CRRT 7/9. Had BL hydronephrosis despite foley and ureteral stents so underwent nephrostomy tube placement as well.   - Off CRRT since 7/11, had HD for clearance 7/13 -  Given low muscle mass I suspect eGFR is overestimate - will check 24h urine for CrCl to help aid in decision re: chronic dialysis, 24h collection should complete 7/14 8pm - If requires ongoing HD will also need to place tunneled catheter and obtain perm access; maintain temp HD catheter for now pending 24h collection   # Chronic urinary retention: missed scheduled TURP per charting - s/p BL nephrostomy tubes this admission -urology following, note from today reviewed; appreciated   # Hx CVA - left hemiparesis - previously at SNF and noted he presented from home most recently   # Anemia - normocytic.  Has rec'd 2u pRBC.   Started ESA 7/11.  Primary plans for CT a/p if continued decline in H/H.    Would ultimately benefit from  discharge to a SNF if he is to continue with aggressive care - he has not been able to manage his care or live independently but he's again declining.  Brought up concerns with him today re: feasibility of HD but he is unconcerned and says he has home health and [facility] transportation if HD is needed.  Chase Mend, MD 10/12/2020 7:49 AM

## 2020-10-12 NOTE — Progress Notes (Signed)
PROGRESS NOTE    Chase [name]  IRC:[id number] DOB: 02-05-1966 DOA: 10/06/2020 PCP: [name]   Brief Narrative:  Chase [name] is a 55 y.o. male with a PMH significant for CKD stage V, hx of GIB and ulcers requiring gastric bypass, prior stroke, hodgkin's lymphoma, prior MI, cervical radiculopathy, chronic pain syndrome, COVID, and PTSD who presented to the ED via EMS after being found minimally responsive on welfare check. On EMS arrival patient was found in recliner alert but confused. On chart review it appears patient was scheduled for pre op lab work 7/6 and TURP 7/7 but failed to show for either appointment. Patient has an extensive past medical history to include CKD stage 5 that progressed to acute renal failure march of this year and required CRRT. He also has indwelling catheter and bilateral nephrostomy tubes for hydronephrosis. He presents altered but in stable condition with no immediate needs for hemodialysis. Acidosis has improved with bicarb drip. Nephrology has already been consulted.  Assessment & Plan:   Principal Problem:   Acute renal failure superimposed on stage 4 chronic kidney disease (HCC) Active Problems:   BPH (benign prostatic hyperplasia)   Postlaminectomy syndrome, not elsewhere classified   PTSD (post-traumatic stress disorder)   AKI (acute kidney injury) ([place])   Anxiety and depression   Anemia in chronic kidney disease   Acute metabolic encephalopathy   Acute renal failure (ARF) (HCC)   Acute on chronic renal failure - CKD-V  - Related to some issue with GU drainage and presumed worsening prostatic/obstruction given outpatient plan TURP and missed appointment - Status post bilateral nephrostomy tubes - previously requiring CRRT (off as of 10/09/20).  HD x1 on 7/13 for clearance - Further urological plans will depend on renal recover - UOP unfortunately appears to be downtrending over the past 48h, oral intake is also down - recommend  increased PO intake - patient may benefit from low volume IVF if not taking PO appropriately - will discuss with nephrology given need for CRRT/HD don't want to overload him  Chronic anemia, likely of chronic disease and malnutrition - Received 2 units pRBC 7/11, hgb 7.6 this am.  No s/s of bleeding.   Failure to thrive, severe protein calorie malnutrition - Continue to encourage PO intake - liberalize diet and increase PT/OT  Hypotension:  - Related to chronic wasting and acute acidemia, improved, off pressors. Now on midodrine.  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  ? If he will ever be candidate for re-anastamosis to rectal stump.  DVT prophylaxis: holding in the setting of anemia Code Status: DNR Family Communication: None present  Status is: Inpt  Dispo: The patient is from: Home              Anticipated d/c is to: TBD - requesting DC home when possible              Anticipated d/c date is: >68h              Patient currently NOT medically stable for discharge  Consultants:  Nephrology, Urology  Antimicrobials:  None   Subjective: No acute issues/events overnight - somewhat flat affect this am - ROS negative; patient's complaint focuses mainly on food and disposition (when can he go home)  Objective: Vitals:   10/11/20 1510 10/11/20 1541 10/11/20 2043 10/12/20 0455  BP: (!) 92/51 107/66 98/62 95/64   Pulse:  (!) 55  61 (!) 55  Resp: 17 15 18    Temp:  98.4 F (36.9 C) 99.2 F (37.3 C) 98.2 F (36.8 C)  TempSrc:  Oral Oral Oral  SpO2: 98% 98% 97% 97%  Weight:      Height:        Intake/Output Summary (Last 24 hours) at 10/12/2020 0759 Last data filed at 10/12/2020 0600 Gross per 24 hour  Intake 380 ml  Output 1651 ml  Net -1271 ml    Filed Weights   10/11/20 0500 10/11/20 1200 10/11/20 1505  Weight: 49.3 kg 52 kg 51 kg     Examination:  General:  Pleasantly resting in bed, No acute distress. HEENT:  Normocephalic atraumatic.  Sclerae nonicteric, noninjected.  Extraocular movements intact bilaterally. Neck:  Without mass or deformity.  Trachea is midline. Lungs:  Clear to auscultate bilaterally without rhonchi, wheeze, or rales. Heart:  Regular rate and rhythm.  Without murmurs, rubs, or gallops. Abdomen:  Soft, nontender, nondistended.  Without guarding or rebound. Nephrostomy tube - dark yellow output; ostomy in place Extremities: Without cyanosis, clubbing, edema, or obvious deformity. Vascular:  Dorsalis pedis and posterior tibial pulses palpable bilaterally. Skin:  Warm and dry, no erythema, no ulcerations.   Data Reviewed: I have personally reviewed following labs and imaging studies  CBC: Recent Labs  Lab 10/06/20 1805 10/07/20 0535 10/08/20 0309 10/08/20 1511 10/09/20 0302 10/09/20 1355 10/10/20 0330 10/11/20 1100  WBC 6.0   < > 4.3 4.1 7.5  --  8.5 9.7  NEUTROABS 4.7  --   --   --   --   --   --   --   HGB 9.3*   < > 7.2* 9.2* 6.7* 6.6* 7.6* 7.3*  HCT 29.0*   < > 20.9* 26.3* 19.5* 20.0* 22.3* 22.3*  MCV 97.3   < > 90.5 91.3 92.0  --  91.8 95.3  PLT 233   < > 174 162 137*  --  106* 155   < > = values in this interval not displayed.    Basic Metabolic Panel: Recent Labs  Lab 10/08/20 0309 10/08/20 1516 10/09/20 0302 10/09/20 1355 10/10/20 0309 10/10/20 1801 10/11/20 0319 10/11/20 1555  NA 139   < > 136 136 135 136 135 137  K 3.2*   < > 4.1 3.8 3.7 3.6 3.7 3.8  CL 108   < > 102 104 103 102 106 102  CO2 25   < > 25 25 25 24 22 28   GLUCOSE 112*   < > 100* 122* 108* 102* 99 90  BUN 43*   < > 17 16 20  28* 28* 10  CREATININE 4.14*   < > 2.17* 2.28* 3.07* 3.84* 4.05* 1.99*  CALCIUM 7.1*   < > 7.2* 6.9* 6.9* 6.8* 6.5* 6.9*  MG 2.4  --  2.2  --  2.2  --  2.4  --   PHOS 2.4*   < > 3.5 4.0 4.3 5.3* 5.6* 2.5   < > = values in this interval not displayed.    GFR: Estimated  Creatinine Clearance: 29.9 mL/min (A) (by C-G formula based on SCr of 1.99 mg/dL (H)). Liver Function Tests: Recent Labs  Lab 10/06/20 1805 10/07/20 0535 10/07/20 1719 10/09/20 1355 10/10/20 0309 10/10/20 1801 10/11/20 0319 10/11/20 1555  AST 14* 16  --   --   --   --   --   --   ALT 54* 40  --   --   --   --   --   --  ALKPHOS 129* 94  --   --   --   --   --   --   BILITOT 0.9 1.1  --   --   --   --   --   --   PROT 7.6 5.8*  --   --   --   --   --   --   ALBUMIN 3.4* 2.7*   < > 2.0* 1.9* 2.0* 1.8* 1.9*   < > = values in this interval not displayed.    No results for input(s): LIPASE, AMYLASE in the last 168 hours. No results for input(s): AMMONIA in the last 168 hours. Coagulation Profile: Recent Labs  Lab 10/08/20 0811  INR 1.1    Cardiac Enzymes: No results for input(s): CKTOTAL, CKMB, CKMBINDEX, TROPONINI in the last 168 hours. BNP (last 3 results) No results for input(s): PROBNP in the last 8760 hours. HbA1C: No results for input(s): HGBA1C in the last 72 hours. CBG: Recent Labs  Lab 10/06/20 1905 10/09/20 1957  GLUCAP 115* 121*    Lipid Profile: No results for input(s): CHOL, HDL, LDLCALC, TRIG, CHOLHDL, LDLDIRECT in the last 72 hours. Thyroid Function Tests: No results for input(s): TSH, T4TOTAL, FREET4, T3FREE, THYROIDAB in the last 72 hours. Anemia Panel: No results for input(s): VITAMINB12, FOLATE, FERRITIN, TIBC, IRON, RETICCTPCT in the last 72 hours. Sepsis Labs: Recent Labs  Lab 10/06/20 1746 10/06/20 2312 10/07/20 0535 10/08/20 0309  PROCALCITON  --  0.17 0.17 0.13  LATICACIDVEN 1.0  --   --   --      Recent Results (from the past 240 hour(s))  SARS CORONAVIRUS 2 (TAT 6-24 HRS) Nasopharyngeal Nasopharyngeal Swab     Status: None   Collection Time: 10/03/20 10:20 AM   Specimen: Nasopharyngeal Swab  Result Value Ref Range Status   SARS Coronavirus 2 NEGATIVE NEGATIVE Final    Comment: (NOTE) SARS-CoV-2 target nucleic acids are NOT  DETECTED.  The SARS-CoV-2 RNA is generally detectable in upper and lower respiratory specimens during the acute phase of infection. Negative results do not preclude SARS-CoV-2 infection, do not rule out co-infections with other pathogens, and should not be used as the sole basis for treatment or other patient management decisions. Negative results must be combined with clinical observations, patient history, and epidemiological information. The expected result is Negative.  Fact Sheet for Patients: SugarRoll.be  Fact Sheet for Healthcare Providers: https://www.woods-mathews.com/  This test is not yet approved or cleared by the [place] FDA and  has been authorized for detection and/or diagnosis of SARS-CoV-2 by FDA under an Emergency Use Authorization (EUA). This EUA will remain  in effect (meaning this test can be used) for the duration of the COVID-19 declaration under Se ction 564(b)(1) of the Act, 21 U.S.C. section 360bbb-3(b)(1), unless the authorization is terminated or revoked sooner.  Performed at [facility], [place] [address], [place], [place] [address]   Culture, blood (routine x 2)     Status: None   Collection Time: 10/06/20  5:47 PM   Specimen: BLOOD RIGHT FOREARM  Result Value Ref Range Status   Specimen Description BLOOD RIGHT FOREARM  Final   Special Requests   Final    Blood Culture results may not be optimal due to an inadequate volume of blood received in culture bottles BOTTLES DRAWN AEROBIC AND ANAEROBIC   Culture   Final    NO GROWTH 5 DAYS Performed at [facility], [address] [place], [place] [address]  Report Status 10/11/2020 FINAL  Final  Culture, blood (routine x 2)     Status: None   Collection Time: 10/06/20  6:06 PM   Specimen: BLOOD RIGHT ARM  Result Value Ref Range Status   Specimen Description BLOOD RIGHT ARM  Final   Special Requests   Final    BOTTLES DRAWN AEROBIC AND ANAEROBIC  Blood Culture adequate volume   Culture   Final    NO GROWTH 5 DAYS Performed at [facility], [address] [place], [place] [address]    Report Status 10/11/2020 FINAL  Final  Urine culture     Status: Abnormal   Collection Time: 10/06/20  7:31 PM   Specimen: Urine, Clean Catch  Result Value Ref Range Status   Specimen Description   Final    URINE, CLEAN CATCH Performed at [facility], [address], [place], [place] [address]    Special Requests   Final    NONE Performed at [facility], [address] [place], [place] [address]    Culture MULTIPLE SPECIES PRESENT, SUGGEST RECOLLECTION (A)  Final   Report Status 10/08/2020 FINAL  Final  Resp Panel by RT-PCR (Flu A&B, Covid) Nasopharyngeal Swab     Status: None   Collection Time: 10/06/20  8:10 PM   Specimen: Nasopharyngeal Swab; Nasopharyngeal(NP) swabs in vial transport medium  Result Value Ref Range Status   SARS Coronavirus 2 by RT PCR NEGATIVE NEGATIVE Final    Comment: (NOTE) SARS-CoV-2 target nucleic acids are NOT DETECTED.  The SARS-CoV-2 RNA is generally detectable in upper respiratory specimens during the acute phase of infection. The lowest concentration of SARS-CoV-2 viral copies this assay can detect is 138 copies/mL. A negative result does not preclude SARS-Cov-2 infection and should not be used as the sole basis for treatment or other patient management decisions. A negative result may occur with  improper specimen collection/handling, submission of specimen other than nasopharyngeal swab, presence of viral mutation(s) within the areas targeted by this assay, and inadequate number of viral copies(<138 copies/mL). A negative result must be combined with clinical observations, patient history, and epidemiological information. The expected result is Negative.  Fact Sheet for Patients:  EntrepreneurPulse.com.au  Fact Sheet for Healthcare Providers:   IncredibleEmployment.be  This test is no t yet approved or cleared by the [place] FDA and  has been authorized for detection and/or diagnosis of SARS-CoV-2 by FDA under an Emergency Use Authorization (EUA). This EUA will remain  in effect (meaning this test can be used) for the duration of the COVID-19 declaration under Section 564(b)(1) of the Act, 21 U.S.C.section 360bbb-3(b)(1), unless the authorization is terminated  or revoked sooner.       Influenza A by PCR NEGATIVE NEGATIVE Final   Influenza B by PCR NEGATIVE NEGATIVE Final    Comment: (NOTE) The Xpert Xpress SARS-CoV-2/FLU/RSV plus assay is intended as an aid in the diagnosis of influenza from Nasopharyngeal swab specimens and should not be used as a sole basis for treatment. Nasal washings and aspirates are unacceptable for Xpert Xpress SARS-CoV-2/FLU/RSV testing.  Fact Sheet for Patients: EntrepreneurPulse.com.au  Fact Sheet for Healthcare Providers: IncredibleEmployment.be  This test is not yet approved or cleared by the [place] FDA and has been authorized for detection and/or diagnosis of SARS-CoV-2 by FDA under an Emergency Use Authorization (EUA). This EUA will remain in effect (meaning this test can be used) for the duration of the COVID-19 declaration under Section 564(b)(1) of the Act, 21 U.S.C. section 360bbb-3(b)(1),  unless the authorization is terminated or revoked.  Performed at [facility], [address], [place], [place] [address]   MRSA Next Gen by PCR, Nasal     Status: None   Collection Time: 10/07/20  1:37 PM   Specimen: Nasal Mucosa; Nasal Swab  Result Value Ref Range Status   MRSA by PCR Next Gen NOT DETECTED NOT DETECTED Final    Comment: (NOTE) The GeneXpert MRSA Assay (FDA approved for NASAL specimens only), is one component of a comprehensive MRSA colonization surveillance program. It is not intended to diagnose MRSA  infection nor to guide or monitor treatment for MRSA infections. Test performance is not FDA approved in patients less than 63 years old. Performed at [facility], [place] [address], [place], [place] [address]   Aerobic/Anaerobic Culture w Gram Stain (surgical/deep wound)     Status: None (Preliminary result)   Collection Time: 10/08/20 12:02 PM   Specimen: Urine, Random  Result Value Ref Range Status   Specimen Description URINE, RANDOM  Final   Special Requests LEFT KIDNEY  Final   Gram Stain   Final    ABUNDANT WBC PRESENT,BOTH PMN AND MONONUCLEAR ABUNDANT GRAM VARIABLE ROD Performed at [facility], [place] [address] [place], [place] [address]    Culture   Final    ABUNDANT ESCHERICHIA COLI ABUNDANT PSEUDOMONAS AERUGINOSA ABUNDANT ENTEROCOCCUS FAECIUM SUSCEPTIBILITIES TO FOLLOW NO ANAEROBES ISOLATED; CULTURE IN PROGRESS FOR 5 DAYS    Report Status PENDING  Incomplete     Radiology Studies: No results found.  Scheduled Meds:  calcium carbonate  1 tablet Oral TID WC   Chlorhexidine Gluconate Cloth  6 each Topical Daily   Chlorhexidine Gluconate Cloth  6 each Topical Q0600   darbepoetin (ARANESP) injection - DIALYSIS  60 mcg Intravenous Q Mon-HD   mouth rinse  15 mL Mouth Rinse BID   midodrine  10 mg Oral Q T,Th,Sa-HD   multivitamin with minerals  1 tablet Oral BID   pantoprazole  40 mg Oral Q1200   sodium chloride flush  5 mL Intracatheter Q8H   Continuous Infusions:  sodium chloride 250 mL (10/09/20 1732)   norepinephrine (LEVOPHED) Adult infusion Stopped (10/08/20 0401)     LOS: 6 days   Time spent: 30 min  Little Ishikawa, DO [facility]  If 7PM-7AM, please contact night-coverage [email or web address]  10/12/2020, 7:59 AM

## 2020-10-12 NOTE — Evaluation (Signed)
Physical Therapy Evaluation Patient Details Name: Chase [name] MRN: [id number] DOB: 1965-10-10 Today's Date: 10/12/2020   History of Present Illness  Pt is 55 yo male who presented to the ED via EMS after being found minimally responsive on welfare check. PMH significant for CKD stage IV, hx of GIB and ulcers requiring gastric bypass, prior stroke, hodgkin's lymphoma, prior MI, cervical radiculopathy, chronic pain syndrome, COVID, and PTSD.  Clinical Impression  PTA pt living alone in single story home with ramped entrance. Pt reports independence in house hold mobility using Rollator and scooter for community level mobility. Pt reports hx of falling. Pt reports independence in ADLs and iADLs, however per notes pt's son reports he is not doing a good job of managing his home. Pt is currently limited in safe mobility by decreased insight into his deficits, and pain in presence of increased weakness R>L. Pt very upset about not being able to "rest" and refused any mobility today. Pt was agreeable to rolling with total A for placement of dry linen and for maximal assist to help pt pull himself to HoB using bedrail in Trendelenburg. PT adamantly refuses SNF level rehab, and despite education refusing HHAide to assist with ADLs. Pt will need maximal HH service for any chance of safety at home. PT will continue to follow acutely.      Follow Up Recommendations Home health PT;Other (comment);Supervision - Intermittent (pt refuses SNF, encouraging HHAide through [place] however pt wants some days to his self.)    Equipment Recommendations  None recommended by PT (has necessary equipment)       Precautions / Restrictions Precautions Precautions: Fall Restrictions Weight Bearing Restrictions: No      Mobility  Bed Mobility Overal bed mobility: Needs Assistance Bed Mobility: Rolling Rolling: Total assist         General bed mobility comments: pt declined bed mobility, however when scooting him up  in the bed, linens noted to be wet from iced nephrostomy bags, requires total A to come into sidelying bilaterally for placement of dry linens. With scooting up in bed encouraged pt to reach for headboard, lacks full flexion, but once he can reach able to provide minimal assistance to therapy with bed in trendelenberg to scoot towards HoB    Transfers                 General transfer comment: Pt declined OOB activity at this time d/t fatigue and desire to rest at this time.         Balance Overall balance assessment: History of Falls                                           Pertinent Vitals/Pain Pain Assessment: Faces Faces Pain Scale: Hurts little more Pain Location: kidneys Pain Descriptors / Indicators: Discomfort;Grimacing Pain Intervention(s): Monitored during session;Patient requesting pain meds-RN notified;Repositioned;Limited activity within patient's tolerance    Home Living Family/patient expects to be discharged to:: Private residence Living Arrangements: Alone Available Help at Discharge: Available PRN/intermittently;Other (Comment) (Pt reports someone checks in on him once a week but unable to state who.) Type of Home: House Home Access: Ramped entrance     Home Layout: One level Home Equipment: Bedside commode Additional Comments: Has two bedside commodes: Keeps one over a toilet and the other used as a shower chair in pt's walk in shower.  Prior Function Level of Independence: Needs assistance   Gait / Transfers Assistance Needed: per chart review uses rollator at baseline. H/o frequent falls.  ADL's / Homemaking Assistance Needed: Pt reports he is able to take care of himself, do laundry, etc. He reports pacing activity as a strategy he uses. Per chart review and clinical judgment suspect pt struggling with ADL tasks at home.  Comments: noted that pt has now has at least 2 episodes of being found down/minimally responsive in his  home. Per chart review son feels pt is not managing well at home. Prior CVA in July with residual LT sided weakness, prior [employer] and enjoys fishing.     Hand Dominance   Dominant Hand: Right    Extremity/Trunk Assessment   Upper Extremity Assessment Upper Extremity Assessment: Defer to OT evaluation LUE Deficits / Details: LUE grossly a bit weaker than RUE    Lower Extremity Assessment Lower Extremity Assessment: Generalized weakness LLE Deficits / Details: LLE noted to be weaker than RLE. 3-/5 grossly       Communication   Communication: No difficulties  Cognition Arousal/Alertness: Awake/alert Behavior During Therapy: Flat affect;WFL for tasks assessed/performed (easily frustrated, does not understand why "I just can't rest.") Overall Cognitive Status: Within Functional Limits for tasks assessed                                 General Comments: Pt very irritated about multiple disruptions when he just wants to get some rest. Attempted education on work flow in hospital loaded towards first thing in the morning. All in an attempt to progress treatment for prompt discharge so that pt can get more rest. Pt with decreased insight into deficits vs behavioral? Will not currently entertain the idea of HHAide to assist with ADLs      General Comments General comments (skin integrity, edema, etc.): Pt noted to have sacral pressure injury and bilateral heel with profilactic foam dressings. Pt    Exercises     Assessment/Plan    PT Assessment Patient needs continued PT services  PT Problem List Decreased strength;Decreased range of motion;Decreased activity tolerance;Decreased balance;Decreased mobility;Decreased coordination;Decreased cognition;Decreased safety awareness;Decreased knowledge of precautions;Pain;Decreased skin integrity       PT Treatment Interventions DME instruction;Gait training;Functional mobility training;Therapeutic activities;Therapeutic  exercise;Balance training;Cognitive remediation;Patient/family education;Wheelchair mobility training    PT Goals (Current goals can be found in the Care Plan section)  Acute Rehab PT Goals Patient Stated Goal: to go home from the hospital PT Goal Formulation: With patient Time For Goal Achievement: 10/26/20 Potential to Achieve Goals: Poor    Frequency Min 3X/week   Barriers to discharge Decreased caregiver support      Co-evaluation               AM-PAC PT "6 Clicks" Mobility  Outcome Measure Help needed turning from your back to your side while in a flat bed without using bedrails?: Total Help needed moving from lying on your back to sitting on the side of a flat bed without using bedrails?: Total Help needed moving to and from a bed to a chair (including a wheelchair)?: Total Help needed standing up from a chair using your arms (e.g., wheelchair or bedside chair)?: Total Help needed to walk in hospital room?: Total Help needed climbing 3-5 steps with a railing? : Total 6 Click Score: 6    End of Session   Activity Tolerance: Patient limited  by pain;Patient limited by fatigue Patient left: in bed;with call bell/phone within reach;with bed alarm set Nurse Communication: Mobility status;Patient requests pain meds PT Visit Diagnosis: Muscle weakness (generalized) (M62.81);Repeated falls (R29.6);History of falling (Z91.81);Unsteadiness on feet (R26.81);Other abnormalities of gait and mobility (R26.89);Difficulty in walking, not elsewhere classified (R26.2);Hemiplegia and hemiparesis;Pain Hemiplegia - Right/Left: Left Hemiplegia - dominant/non-dominant: Non-dominant Hemiplegia - caused by: Cerebral infarction Pain - Right/Left:  (bilateral) Pain - part of body:  (kidneys)    Time: 3010-4045 PT Time Calculation (min) (ACUTE ONLY): 26 min   Charges:   PT Evaluation $PT Eval Moderate Complexity: 1 Mod PT Treatments $Therapeutic Activity: 8-22 mins        Mao Lockner  B. Migdalia Dk PT, DPT Acute Rehabilitation Services Pager [phone] Office [phone]   [place] 10/12/2020, 9:37 AM

## 2020-10-12 NOTE — Progress Notes (Signed)
Referring Physician(s): Borden,L  Supervising Physician: Sandi Mariscal  Patient Status:  [facility] - In-pt  Chief Complaint:  Flank pain, renal failure, bilateral hydronephrosis despite ureteral stents  Subjective: Patient resting quietly in bed, only reports mild flank discomfort at PCN sites   Allergies: Nsaids and Ciprofloxacin  Medications: Prior to Admission medications   Medication Sig Start Date End Date Taking? Authorizing Provider  cyanocobalamin (,VITAMIN B-12,) 1000 MCG/ML injection Inject 1,000 mcg into the muscle every 30 (thirty) days.  12/28/13  Yes [provider]  DULoxetine (CYMBALTA) 30 MG capsule Take 1 capsule (30 mg total) by mouth 2 (two) times daily. 07/07/20 07/07/21 Yes Sheikh, Omair Latif, DO  hydrOXYzine (ATARAX/VISTARIL) 10 MG tablet Take 1 tablet (10 mg total) by mouth 3 (three) times daily as needed for itching or anxiety. 07/07/20  Yes Sheikh, Omair Latif, DO  Multiple Vitamin (MULTIVITAMIN WITH MINERALS) TABS tablet Take 1 tablet by mouth daily. 05/03/20  Yes Samella Parr, NP  pantoprazole (PROTONIX) 20 MG tablet TAKE 1 TABLET (20 MG TOTAL) BY MOUTH 2 (TWO) TIMES DAILY. Patient taking differently: Take 20 mg by mouth 2 (two) times daily. 05/03/20 05/03/21 Yes Samella Parr, NP  silodosin (RAPAFLO) 8 MG CAPS capsule Take 1 capsule (8 mg total) by mouth daily with breakfast. 07/07/20  Yes Sheikh, Omair Latif, DO  sodium bicarbonate 650 MG tablet TAKE 1 TABLET (650 MG TOTAL) BY MOUTH 2 (TWO) TIMES DAILY. Patient taking differently: Take 650 mg by mouth 2 (two) times daily. 05/03/20 05/03/21 Yes Samella Parr, NP  acetaminophen (TYLENOL) 500 MG tablet Take 1 tablet (500 mg total) by mouth every 6 (six) hours as needed for mild pain, fever or headache. Patient not taking: No sig reported 05/01/20   Samella Parr, NP  calcium carbonate (TUMS - DOSED IN MG ELEMENTAL CALCIUM) 500 MG chewable tablet Chew 1 tablet (200 mg of elemental calcium total) by mouth 3  (three) times daily. Patient not taking: No sig reported 05/01/20   Samella Parr, NP  feeding supplement (ENSURE ENLIVE / ENSURE PLUS) LIQD Take 237 mLs by mouth 2 (two) times daily between meals. Patient not taking: Reported on 10/07/2020 07/07/20   Raiford Noble Latif, DO  fentaNYL (DURAGESIC) 25 MCG/HR Place 1 patch onto the skin every 3 (three) days. Patient not taking: Reported on 10/07/2020 07/08/20   Raiford Noble Latif, DO  folic acid (FOLVITE) 1 MG tablet Take 1 tablet (1 mg total) by mouth daily. Patient not taking: Reported on 10/07/2020 07/08/20   Raiford Noble Latif, DO  gabapentin (NEURONTIN) 100 MG capsule TAKE 2 CAPSULES (200 MG TOTAL) BY MOUTH DAILY. Patient not taking: Reported on 10/07/2020 05/03/20 05/03/21  Samella Parr, NP  gabapentin (NEURONTIN) 300 MG capsule TAKE 1 CAPSULE (300 MG TOTAL) BY MOUTH AT BEDTIME. Patient not taking: Reported on 10/07/2020 05/03/20 05/03/21  Samella Parr, NP  Nutritional Supplements (,FEEDING SUPPLEMENT, PROSOURCE PLUS) liquid Take 30 mLs by mouth 2 (two) times daily between meals. Patient not taking: Reported on 10/07/2020 07/07/20   Raiford Noble Latif, DO  polycarbophil (FIBERCON) 625 MG tablet Take 1 tablet (625 mg total) by mouth 2 (two) times daily. Patient not taking: Reported on 10/07/2020 05/01/20   Samella Parr, NP  polyethylene glycol (MIRALAX / GLYCOLAX) 17 g packet Take 17 g by mouth daily as needed for moderate constipation. Patient not taking: Reported on 10/07/2020 07/07/20   Raiford Noble Latif, DO  Vitamin D3 (VITAMIN D) 25 MCG  tablet TAKE 800 UNITS BY MOUTH DAILY. Patient not taking: Reported on 10/07/2020 05/03/20 05/03/21  Samella Parr, NP     Vital Signs: BP 93/60 (BP Location: Right Arm)   Pulse (!) 58   Temp 98.6 F (37 C) (Oral)   Resp 16   Ht 5\' 6"  (1.676 m)   Wt 112 lb 7 oz (51 kg)   SpO2 97%   BMI 18.15 kg/m   Physical Exam awake/alert; L/R PCN's intact, OP L-450 cc blood tinged urine, R- 75 cc blood tinged  urine  Imaging: ECHOCARDIOGRAM LIMITED  Result Date: 10/08/2020    ECHOCARDIOGRAM LIMITED REPORT   Patient Name:   Chase [name] Date of Exam: 10/08/2020 Medical Rec #:  [id number]       Height:       66.0 in Accession #:    [id number]      Weight:       119.3 lb Date of Birth:  July 16, 1965       BSA:          1.605 m Patient Age:    55 years        BP:           126/66 mmHg Patient Gender: M               HR:           61 bpm. Exam Location:  Inpatient Procedure: Limited Echo Indications:    Abnormal ECG  History:        Patient has prior history of Echocardiogram examinations, most                 recent 06/28/2020. Previous Myocardial Infarction. H/O stroke and                 COVID-19. CKD. GERD. Cancer.  Sonographer:    Clayton Lefort RDCS (AE) Referring Phys: [phone] [facility] D HARRIS  Sonographer Comments: Unable to find parasternal or apical imaging windows. Subcostal area covered with ostomy bag and bandages.  Uninterpretable study. Sanda Klein MD Electronically signed by Sanda Klein MD Signature Date/Time: 10/08/2020/3:10:26 PM    Final     Labs:  CBC: Recent Labs    10/09/20 0302 10/09/20 1355 10/10/20 0330 10/11/20 1100 10/12/20 0708  WBC 7.5  --  8.5 9.7 7.2  HGB 6.7* 6.6* 7.6* 7.3* 7.7*  HCT 19.5* 20.0* 22.3* 22.3* 23.4*  PLT 137*  --  106* 155 159    COAGS: Recent Labs    10/17/19 1241 10/17/19 1311 11/07/19 1836 11/07/19 1853 11/10/19 1052 01/26/20 1050 03/31/20 1420 04/16/20 1354 10/08/20 0811  INR 1.5* 1.6*   < >  --    < > 1.2 1.5* 1.0 1.1  APTT 43* 46*  --  31  --   --  37*  --   --    < > = values in this interval not displayed.    BMP: Recent Labs    01/01/20 0430 01/02/20 0427 01/03/20 0506 01/04/20 0453 01/05/20 0558 10/10/20 1801 10/11/20 0319 10/11/20 1555 10/12/20 0708  NA 143 144 143 143   < > 136 135 137 135  K 3.5 3.9 3.9 3.9   < > 3.6 3.7 3.8 3.7  CL 116* 113* 112* 112*   < > 102 106 102 103  CO2 18* 19* 22 23   < > 24 22 28 25    GLUCOSE 73 84 81 77   < > 102* 99 90 102*  BUN 60* 61* 53* 47*   < > 28* 28* 10 17  CALCIUM 7.3* 7.5* 7.5* 7.4*   < > 6.8* 6.5* 6.9* 6.6*  CREATININE 6.54* 6.27* 6.42* 5.74*   < > 3.84* 4.05* 1.99* 3.11*  GFRNONAA 9* 9* 9* 10*   < > 18* 16* 39* 23*  GFRAA 10* 11* 10* 12*  --   --   --   --   --    < > = values in this interval not displayed.    LIVER FUNCTION TESTS: Recent Labs    07/06/20 0456 07/07/20 0605 10/06/20 1805 10/07/20 0535 10/07/20 1719 10/10/20 1801 10/11/20 0319 10/11/20 1555 10/12/20 0708  BILITOT 0.7 0.6 0.9 1.1  --   --   --   --   --   AST 21 17 14* 16  --   --   --   --   --   ALT 14 12 54* 40  --   --   --   --   --   ALKPHOS 81 77 129* 94  --   --   --   --   --   PROT 4.4* 4.3* 7.6 5.8*  --   --   --   --   --   ALBUMIN 1.7*  1.7* 1.7*  1.6* 3.4* 2.7*   < > 2.0* 1.8* 1.9* 1.9*   < > = values in this interval not displayed.    Assessment and Plan: 55 year old male with history of Hodgkin's lymphoma, CVA, BPH with urinary retention s/p chronic Foley catheter and bilateral double-J stent and bilateral PCN placement; s/p bilateral PCN removal on 04/25/2020, who presented to [facility] ED with chief complaint of AMS, found to have bilateral hydronephrosis and AKI, s/p bilateral PCN placement with IR on 10/08/2020. afebrile; WBC nl; hgb 7.7(7.3), creat 3.11(1.99); urine cx- e coli, pseudomonas, enterococcus; received HD yesterday; plans as outlined by urology/nephrology   Electronically Signed: D. Rowe Robert, PA-C 10/12/2020, 1:29 PM   I spent a total of 15 Minutes at the the patient's bedside AND on the patient's hospital floor or unit, greater than 50% of which was counseling/coordinating care for bilateral nephrostomy tubes    Patient ID: Chase [name], male   DOB: 1966-02-10, 55 y.o.   MRN: [id number]

## 2020-10-12 NOTE — TOC Initial Note (Signed)
Transition of Care [facility]) - Initial/Assessment Note    Patient Details  Name: Chase [name] MRN: [id number] Date of Birth: [birth year]  Transition of Care [facility]) CM/SW Contact:    Ninfa Meeker, RN Phone Number: 10/12/2020, 12:16 PM  Clinical Narrative: Patient is a 55 yr old male that    was found to be unresponsive on a welfare check and was brought to the hospital via EMS. He was found to be in  Renal Failure and started on CRRT in ICU. Case manager spoke with patient concerning discharge needs. He has stated that he will not go to SNF, only home. Discussed Home Health agencies, patient states that he has used [facility] and wants them back. CM contacted Stacy, Liaison with [place]. Patient is not medically ready for discharge. TOC Team will continue to follow.   Expected Discharge Plan: [place] Barriers to Discharge: Continued Medical Work up   Patient Goals and CMS Choice     Choice offered to / list presented to : Patient  Expected Discharge Plan and Services Expected Discharge Plan: [place]   Discharge Planning Services: CM Consult Post Acute Care Choice: [place] arrangements for the past 2 months: Single Family Home                           HH Arranged: RN, PT, OT, Nurse's Aide HH Agency: [place] Date [facility] Agency Contacted: 10/12/20 Time HH Agency Contacted: 38 Representative spoke with at [place]: Stacy  Prior Living Arrangements/Services Living arrangements for the past 2 months: [place] with:: Self Patient language and need for interpreter reviewed:: Yes Do you feel safe going back to the place where you live?: Yes      Need for Family Participation in Patient Care: Yes (Comment) Care giver support system in place?: Yes (comment)   Criminal Activity/Legal Involvement Pertinent to Current Situation/Hospitalization: No - Comment as needed  Activities  of Daily Living      Permission Sought/Granted         Permission granted to share info w AGENCY: [facility]        Emotional Assessment   Attitude/Demeanor/Rapport: Gracious   Orientation: : Oriented to Self, Oriented to Place, Oriented to  Time, Oriented to Situation Alcohol / Substance Use: Not Applicable Psych Involvement: No (comment)  Admission diagnosis:  Metabolic encephalopathy [I09.73] Metabolic acidosis [Z32.9] Acute renal failure (ARF) (HCC) [N17.9] Acute renal failure superimposed on stage 4 chronic kidney disease ([place]) [N17.9, N18.4] Urinary tract infection associated with indwelling urethral catheter, initial encounter ([place]) [J24.268T, N39.0] Acute renal failure superimposed on stage 5 chronic kidney disease, not on chronic dialysis, unspecified acute renal failure type ([place]) [N17.9, N18.5] AKI (acute kidney injury) ([place]) [N17.9] Patient Active Problem List   Diagnosis Date Noted   Acute renal failure (ARF) ([place]) 10/07/2020   Acute renal failure superimposed on stage 4 chronic kidney disease ([place]) 10/06/2020   Sepsis with acute renal failure ([place])    Anemia of chronic disease    Acute urinary retention    Bilateral hydronephrosis    Acute metabolic encephalopathy    Acute kidney injury superimposed on chronic kidney disease ([place])    Delirium    Kidney failure 06/27/2020   Pressure injury of skin 06/27/2020   Physical deconditioning    Cachexia ([place]) 04/20/2020   Personality disorder  04/19/2020   Diverticulitis of sigmoid  colon with abscess s/p colectomy/colostomy [facility]) 04/18/2020 04/18/2020   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[facility]) 04/18/2020   Acute respiratory failure with hypoxia [facility])    DNR (do not resuscitate) discussion    Palliative care by specialist    Adult failure to thrive    Sepsis with acute renal failure without septic shock ([place]) 03/31/2020   Incisional hernia - epigastric 01/04/2020   Chronic narcotic use 01/02/2020   Anemia in chronic kidney  disease 01/02/2020   ATN (acute tubular necrosis) ([place]) 12/31/2019   [place]ida UTI 12/31/2019   Electrolyte abnormality 12/31/2019   Increased anion gap metabolic acidosis 11/91/4782   Microcytic anemia 12/31/2019   ABLA (acute blood loss anemia) 12/31/2019   Anxiety and depression 12/31/2019   History of CVA (cerebrovascular accident) 12/31/2019   BMI less than 19,adult 12/31/2019   Bilateral ureteral obstruction s/p perc nephrosotmy & stenting 12/30/2019   AKI (acute kidney injury) ([place]) 12/24/2019   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 coli UTI (urinary tract infection) 11/15/2019   PTSD (post-traumatic stress disorder)    Chemical exposure    Cervical radiculopathy    Shock ([place]) 11/08/2019   Protein-calorie malnutrition, severe 10/23/2019   Cerebral embolism with cerebral infarction 10/18/2019   Acute left-sided weakness 10/17/2019   CKD (chronic kidney disease) stage 5, GFR less than 15 ml/min (HCC) 10/17/2019   Chronic pain 10/17/2019   Fall 10/17/2019   Pseudomonas urinary tract infection 08/24/2019   BPH (benign prostatic hyperplasia) 08/24/2019   Acute kidney failure ([place]) 08/24/2019   Anemia 08/24/2019   Dyspnea 08/24/2019   History of COVID-19 08/24/2019   History of elevated PSA 08/24/2019   History of gastric bypass 08/24/2019   Hyponatremia 95/62/1308   Metabolic acidosis 65/78/4696   Skin lesion of neck 29/52/8413   Toxic metabolic encephalopathy 24/40/1027   Cervical spondylosis without myelopathy 06/19/2016   Neck pain 09/04/2015   History of stomach ulcers 05/19/2013   Male erectile dysfunction 05/19/2013   Hodgkin lymphoma, unspecified, unspecified site ([place]) 12/10/2012   Iron deficiency anemia 12/10/2012   Pernicious anemia 12/10/2012   Arthralgia of multiple sites 04/20/2012   Postlaminectomy syndrome, not elsewhere classified 04/20/2012   PCP:  Clinic, [place]:   [place], [place]  - [place] [address] [place] [address] Phone: [phone] Fax: [phone]  [facility] [place] [place] [address] [place] [address] Phone: [phone] Fax: [phone]     Social Determinants of Health (SDOH) Interventions    Readmission Risk Interventions Readmission Risk Prevention Plan 07/04/2020 12/25/2019 11/19/2019  Transportation Screening Complete Complete Complete  Medication Review (RN Care Manager) Complete Complete Complete  PCP or Specialist appointment within 3-5 days of discharge Complete - Complete  HRI or Home Care Consult Complete - Complete  SW Recovery Care/Counseling Consult Complete - Complete  Palliative Care Screening Not Applicable - Not Applicable  Skilled Nursing Facility Not Applicable - Complete  Some recent data might be hidden

## 2020-10-12 NOTE — Plan of Care (Signed)
  Problem: Respiratory: Goal: Respiratory symptoms related to disease process will be avoided Outcome: Completed/Met

## 2020-10-12 NOTE — Progress Notes (Signed)
Palliative: [name] is lying quietly in bed in a dark room watching television.  He will make and somewhat keep eye contact.  He appears acutely/chronically ill, frail and cachectic.  He is alert and oriented, able to make his basic needs known.  There is no family at bedside at this time.  We talked about his HD session yesterday.  He tells me that he did okay.  He has continued to endorse that he would accept hemodialysis long-term if necessary.  He is planning on returning home with home health.  We talked about the benefits of outpatient palliative services, but he declines outpatient palliative's at this time.  I share that he may start palliative care at any point he is ready, he need only ask his providers.  Conference with attending, bedside nursing staff, transition of care team related to patient condition, needs, goals of care, disposition.  Plan: Continue to treat the treatable but no CPR or intubation.  Continue hemodialysis as offered/needed.  Requesting home with home health.  Declines outpatient palliative.  15 minutes Chase Axe, NP Palliative medicine team Team phone [phone] Greater than 50% of this time was spent counseling and coordinating care related to the above assessment and plan.

## 2020-10-13 ENCOUNTER — Inpatient Hospital Stay (HOSPITAL_COMMUNITY): Payer: No Typology Code available for payment source

## 2020-10-13 DIAGNOSIS — F419 Anxiety disorder, unspecified: Secondary | ICD-10-CM | POA: Diagnosis not present

## 2020-10-13 DIAGNOSIS — N185 Chronic kidney disease, stage 5: Secondary | ICD-10-CM

## 2020-10-13 DIAGNOSIS — N139 Obstructive and reflux uropathy, unspecified: Secondary | ICD-10-CM

## 2020-10-13 DIAGNOSIS — G9341 Metabolic encephalopathy: Secondary | ICD-10-CM | POA: Diagnosis not present

## 2020-10-13 DIAGNOSIS — N179 Acute kidney failure, unspecified: Secondary | ICD-10-CM

## 2020-10-13 DIAGNOSIS — N189 Chronic kidney disease, unspecified: Secondary | ICD-10-CM | POA: Diagnosis not present

## 2020-10-13 LAB — RENAL FUNCTION PANEL
Albumin: 1.8 g/dL — ABNORMAL LOW (ref 3.5–5.0)
Albumin: 1.9 g/dL — ABNORMAL LOW (ref 3.5–5.0)
Anion gap: 9 (ref 5–15)
Anion gap: 6 (ref 5–15)
BUN: 25 mg/dL — ABNORMAL HIGH (ref 6–20)
BUN: 36 mg/dL — ABNORMAL HIGH (ref 6–20)
CO2: 22 mmol/L (ref 22–32)
CO2: 22 mmol/L (ref 22–32)
Calcium: 6.4 mg/dL — CL (ref 8.9–10.3)
Calcium: 6.3 mg/dL — CL (ref 8.9–10.3)
Chloride: 103 mmol/L (ref 98–111)
Chloride: 107 mmol/L (ref 98–111)
Creatinine, Ser: 4.14 mg/dL — ABNORMAL HIGH (ref 0.61–1.24)
Creatinine, Ser: 4.96 mg/dL — ABNORMAL HIGH (ref 0.61–1.24)
GFR, Estimated: 16 mL/min — ABNORMAL LOW (ref >60)
GFR, Estimated: 13 mL/min — ABNORMAL LOW (ref >60)
Glucose, Bld: 124 mg/dL — ABNORMAL HIGH (ref 70–99)
Glucose, Bld: 109 mg/dL — ABNORMAL HIGH (ref 70–99)
Phosphorus: 4.2 mg/dL (ref 2.5–4.6)
Phosphorus: 4.5 mg/dL (ref 2.5–4.6)
Potassium: 3.6 mmol/L (ref 3.5–5.1)
Potassium: 4.8 mmol/L (ref 3.5–5.1)
Sodium: 134 mmol/L — ABNORMAL LOW (ref 135–145)
Sodium: 135 mmol/L (ref 135–145)

## 2020-10-13 LAB — MAGNESIUM: Magnesium: 2.1 mg/dL (ref 1.7–2.4)

## 2020-10-13 MED ORDER — CALCIUM GLUCONATE-NACL 1-0.675 GM/50ML-% IV SOLN
1.0000 g | Freq: Once | INTRAVENOUS | Status: AC
  Administered 2020-10-13: 1000 mg via INTRAVENOUS
  Filled 2020-10-13: qty 50

## 2020-10-13 MED ORDER — DARBEPOETIN ALFA 60 MCG/0.3ML IJ SOSY
60.0000 ug | PREFILLED_SYRINGE | INTRAMUSCULAR | Status: DC
  Administered 2020-10-13: 60 ug via SUBCUTANEOUS
  Filled 2020-10-13: qty 0.3

## 2020-10-13 MED ORDER — CALCIUM CARBONATE ANTACID 500 MG PO CHEW
2.0000 | CHEWABLE_TABLET | Freq: Three times a day (TID) | ORAL | Status: DC
  Administered 2020-10-13 – 2020-10-26 (×24): 400 mg via ORAL
  Filled 2020-10-13 (×28): qty 2

## 2020-10-13 NOTE — Progress Notes (Signed)
Out Patient Arrangements:  Have been requested to arrange out pt HD for pt. Have submitted medical records to [facility] admissions. Please advise of d/c date.   Greg Rozena Fierro [facility] [phone]  

## 2020-10-13 NOTE — Progress Notes (Signed)
Referring Physician(s): Borden,L  Supervising Physician: Dr. Earleen Newport  Patient Status:  [facility] - In-pt  Chief Complaint:  Flank pain, renal failure, bilateral hydronephrosis despite ureteral stents  Subjective: Patient resting quietly in bed. Still a litle sore. Per notes, looks to be agreeable to long term HD   Allergies: Nsaids and Ciprofloxacin  Medications:  Current Facility-Administered Medications:    0.9 %  sodium chloride infusion, 250 mL, Intravenous, Continuous, Donnamae Jude, [facility], Last Rate: 10 mL/hr at 10/09/20 1732, 250 mL at 10/09/20 1732   atropine 1 MG/10ML injection 0.5 mg, 0.5 mg, Intravenous, PRN, Reubin Milan, MD, 0.5 mg at 10/08/20 1035   calcium carbonate (TUMS - dosed in mg elemental calcium) chewable tablet 400 mg of elemental calcium, 2 tablet, Oral, TID WC, Justin Mend, MD   Chlorhexidine Gluconate Cloth 2 % PADS 6 each, 6 each, Topical, Daily, Chesley Mires, MD, 6 each at 10/11/20 3710   Chlorhexidine Gluconate Cloth 2 % PADS 6 each, 6 each, Topical, Q0600, Justin Mend, MD, 6 each at 10/13/20 0549   Darbepoetin Alfa (ARANESP) injection 60 mcg, 60 mcg, Subcutaneous, Q7 days, Pham, Minh Q, RPH-CPP   fentaNYL (SUBLIMAZE) injection 50 mcg, 50 mcg, Intravenous, Q2H PRN, Chesley Mires, MD, 50 mcg at 10/13/20 0529   heparin injection 1,000-6,000 Units, 1,000-6,000 Units, CRRT, PRN, Claudia Desanctis, MD, 3,000 Units at 10/09/20 0954   HYDROcodone-acetaminophen (NORCO/VICODIN) 5-325 MG per tablet 1-2 tablet, 1-2 tablet, Oral, Q4H PRN, Candee Furbish, MD, 2 tablet at 10/13/20 0809   MEDLINE mouth rinse, 15 mL, Mouth Rinse, BID, Chesley Mires, MD, 15 mL at 10/12/20 1215   midodrine (PROAMATINE) tablet 10 mg, 10 mg, Oral, Q T,Th,Sa-HD, Justin Mend, MD, 10 mg at 10/12/20 1149   multivitamin with minerals tablet 1 tablet, 1 tablet, Oral, BID, Candee Furbish, MD, 1 tablet at 10/13/20 0809   norepinephrine (LEVOPHED) 4mg  in 272mL premix infusion,  2-10 mcg/min, Intravenous, Titrated, Donnamae Jude, RPH, Stopped at 10/08/20 0401   ondansetron (ZOFRAN) injection 4 mg, 4 mg, Intravenous, Q6H PRN, Chesley Mires, MD   pantoprazole (PROTONIX) EC tablet 40 mg, 40 mg, Oral, Q1200, Chesley Mires, MD, 40 mg at 10/12/20 1149   sodium chloride flush (NS) 0.9 % injection 5 mL, 5 mL, Intracatheter, Q8H, Mir, Paula Libra, MD, 5 mL at 10/13/20 0530    Vital Signs: BP 108/62 (BP Location: Left Arm)   Pulse (!) 57   Temp 98.3 F (36.8 C) (Oral)   Resp 18   Ht 5\' 6"  (1.676 m)   Wt 51 kg   SpO2 98%   BMI 18.15 kg/m   Physical Exam awake/alert; L/R PCN's intact, sites clean, dry. Mildly tender. (R)PCN with blood tinged output and some clot (L)PCN output more cloudy today.  Imaging: No results found.  Labs:  CBC: Recent Labs    10/09/20 0302 10/09/20 1355 10/10/20 0330 10/11/20 1100 10/12/20 0708  WBC 7.5  --  8.5 9.7 7.2  HGB 6.7* 6.6* 7.6* 7.3* 7.7*  HCT 19.5* 20.0* 22.3* 22.3* 23.4*  PLT 137*  --  106* 155 159     COAGS: Recent Labs    10/17/19 1241 10/17/19 1311 11/07/19 1836 11/07/19 1853 11/10/19 1052 01/26/20 1050 03/31/20 1420 04/16/20 1354 10/08/20 0811  INR 1.5* 1.6*   < >  --    < > 1.2 1.5* 1.0 1.1  APTT 43* 46*  --  31  --   --  37*  --   --    < > =  values in this interval not displayed.     BMP: Recent Labs    01/01/20 0430 01/02/20 0427 01/03/20 0506 01/04/20 0453 01/05/20 0558 10/11/20 1555 10/12/20 0708 10/12/20 1625 10/13/20 0458  NA 143 144 143 143   < > 137 135 136 134*  K 3.5 3.9 3.9 3.9   < > 3.8 3.7 3.7 3.6  CL 116* 113* 112* 112*   < > 102 103 106 103  CO2 18* 19* 22 23   < > 28 25 22 22   GLUCOSE 73 84 81 77   < > 90 102* 208* 124*  BUN 60* 61* 53* 47*   < > 10 17 22* 25*  CALCIUM 7.3* 7.5* 7.5* 7.4*   < > 6.9* 6.6* 6.4* 6.4*  CREATININE 6.54* 6.27* 6.42* 5.74*   < > 1.99* 3.11* 3.79* 4.14*  GFRNONAA 9* 9* 9* 10*   < > 39* 23* 18* 16*  GFRAA 10* 11* 10* 12*  --   --   --   --    --    < > = values in this interval not displayed.     LIVER FUNCTION TESTS: Recent Labs    07/06/20 0456 07/07/20 0605 10/06/20 1805 10/07/20 0535 10/07/20 1719 10/11/20 1555 10/12/20 0708 10/12/20 1625 10/13/20 0458  BILITOT 0.7 0.6 0.9 1.1  --   --   --   --   --   AST 21 17 14* 16  --   --   --   --   --   ALT 14 12 54* 40  --   --   --   --   --   ALKPHOS 81 77 129* 94  --   --   --   --   --   PROT 4.4* 4.3* 7.6 5.8*  --   --   --   --   --   ALBUMIN 1.7*  1.7* 1.7*  1.6* 3.4* 2.7*   < > 1.9* 1.9* 1.9* 1.8*   < > = values in this interval not displayed.     Assessment and Plan: 55 year old male with history of Hodgkin's lymphoma, CVA, BPH with urinary retention Found to have bilateral hydronephrosis and AKI, s/p bilateral PCN placement with IR on 10/08/2020.  Afebrile; Creat trending up 4.14;  Continue PCN flush TID while bloody/cloudy   Electronically Signed: Ascencion Dike, PA-C 10/13/2020, 10:26 AM   I spent a total of 15 Minutes at the the patient's bedside AND on the patient's hospital floor or unit, greater than 50% of which was counseling/coordinating care for bilateral nephrostomy tubes

## 2020-10-13 NOTE — Consult Note (Addendum)
Hospital Consult    Reason for Consult:  dialysis access Requesting Physician:  Johnney Ou MRN #:  [id number]  History of Present Illness: This is a 55 y.o. 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  He was supposed to have a TURP late this week and didn't show up for procedure or pre-op labs.  He was then found to be unresponsive on a welfare check.  He has an indwelling foley.  Nephrology consulted.  Recommended transfer to [facility] for nephrology and urologic care.  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  Conversation was had by nephrologist with pt and son and they do want to proceed with dialysis and therefore in need of dialysis access.  VVS is consulted.   He denies any claudication sx or rest pain.  He states he walks with a walker.    He has hx of CVA with left hemiparesis.  He has hx of recurrent diverticulitis with abscess formation leading to complex urological obstruction and s/p diverting ostomy with [facility] resection of redundant sigmoid colon.    CT scan on 10/07/2020 revealed a pneumomediastinum with no clear source.   He has hx of MI in the past treated at [facility].  He has hx of Hodgkin's Lymphoma in 2011.  The pt is not on a statin for cholesterol management.  The pt is not on a daily aspirin.   Other AC:  none The pt is not on medication for hypertension.   The pt is not diabetic.   Tobacco hx:  never  Past Medical History:  Diagnosis Date   Anemia    Cervical radiculopathy    with left sided weakness   Chemical exposure    service related injury   Chronic pain    CKD (chronic kidney disease), stage IV ([place]) 10/17/2019   COVID-19    Dyspnea    GERD (gastroesophageal reflux disease)    GIB (gastrointestinal bleeding)    Headache    Heart attack ([place])    History of CVA (cerebrovascular accident) 12/31/2019   History of gastric  bypass 08/24/2019   History of stomach ulcers 05/19/2013   Formatting of this note might be different from the original. Attributed to chronic ibuprofen use   Hodgkin lymphoma, unspecified, unspecified site ([place]) 12/10/2012   Formatting of this note might be different from the original. In remission.   Hypokalemia    Hypomagnesemia    PTSD (post-traumatic stress disorder)    Renal disorder    CKD   Stroke [facility])    Urinary retention    due to bladder outlet obstruction    Past Surgical History:  Procedure Laterality Date   CHOLECYSTECTOMY     COLONOSCOPY     COLONOSCOPY WITH PROPOFOL N/A 04/18/2020   Procedure: COLONOSCOPY WITH PROPOFOL;  Surgeon: Doran Stabler, MD;  Location: [place];  Service: Gastroenterology;  Laterality: N/A;   CYSTOSCOPY W/ URETERAL STENT PLACEMENT N/A 11/14/2019   Procedure: CYSTOSCOPY WITH RETROGRADE PYELOGRAM bilateral Wyvonnia Dusky STENT PLACEMENT left fulguration bladder . cystogram;  Surgeon: Raynelle Bring, MD;  Location: [facility];  Service: Urology;  Laterality: N/A;   CYSTOSCOPY W/ URETERAL STENT PLACEMENT Bilateral 12/25/2019   Procedure: CYSTOSCOPY WITH bilateral  RETROGRADE PYELOGRAM/ leftURETERAL STENT PLACEMENT;  Surgeon: Lucas Mallow, MD;  Location: [facility];  Service: Urology;  Laterality: Bilateral;   CYSTOSCOPY W/ URETERAL STENT PLACEMENT Bilateral 07/06/2020   Procedure:  CYSTOSCOPY WITH BILATERAL STENT EXCHANGE; BILATERAL PYELOGRAM RETROGRADE;  Surgeon: Raynelle Bring, MD;  Location[facility];  Service: Urology;  Laterality: Bilateral;   CYSTOSCOPY WITH URETEROSCOPY AND STENT PLACEMENT N/A 04/18/2020   Procedure: CYSTOSCOPY WITH BILATERAL URETERAL STENT CHANGE PLACEMENT;  Surgeon: Ardis Hughs, MD;  Location: [place];  Service: Urology;  Laterality: N/A;   EPIGASTRIC HERNIA REPAIR N/A 04/18/2020   Procedure: HERNIA REPAIR EPIGASTRIC ADULT;  Surgeon: Michael Boston, MD;  Location: [place];  Service: General;  Laterality: N/A;   GASTRIC  BYPASS     HERNIA REPAIR     IR FLUORO GUIDE CV LINE RIGHT  04/01/2020   IR NEPHRO TUBE REMOV/FL  04/25/2020   IR NEPHROSTOMY PLACEMENT LEFT  12/27/2019   IR NEPHROSTOMY PLACEMENT LEFT  04/01/2020   IR NEPHROSTOMY PLACEMENT LEFT  10/08/2020   IR NEPHROSTOMY PLACEMENT RIGHT  11/16/2019   IR NEPHROSTOMY PLACEMENT RIGHT  12/27/2019   IR NEPHROSTOMY PLACEMENT RIGHT  04/01/2020   IR NEPHROSTOMY PLACEMENT RIGHT  10/08/2020   IR SINUS/FIST TUBE CHK-NON GI  12/03/2019   IR URETERAL STENT PLACEMENT EXISTING ACCESS RIGHT  12/08/2019   IR URETERAL STENT PLACEMENT EXISTING ACCESS RIGHT  01/26/2020   IR US GUIDANCE  10/08/2020   IR US GUIDANCE  10/08/2020   IR US GUIDE VASC ACCESS RIGHT  04/01/2020   LAPAROSCOPIC LOOP COLOSTOMY N/A 04/18/2020   Procedure: LAPAROSCOPIC SIGMOID COLOTOMY WITH COLOSTOMY;  Surgeon: Michael Boston, MD;  Location: [place];  Service: General;  Laterality: N/A;    Allergies  Allergen Reactions   Nsaids Other (See Comments)    Bleeding GI Ulceration   Ciprofloxacin Nausea And Vomiting    Prior to Admission medications   Medication Sig Start Date End Date Taking? Authorizing Provider  cyanocobalamin (,VITAMIN B-12,) 1000 MCG/ML injection Inject 1,000 mcg into the muscle every 30 (thirty) days.  12/28/13  Yes [provider]  DULoxetine (CYMBALTA) 30 MG capsule Take 1 capsule (30 mg total) by mouth 2 (two) times daily. 07/07/20 07/07/21 Yes Sheikh, Omair Latif, DO  hydrOXYzine (ATARAX/VISTARIL) 10 MG tablet Take 1 tablet (10 mg total) by mouth 3 (three) times daily as needed for itching or anxiety. 07/07/20  Yes Sheikh, Omair Latif, DO  Multiple Vitamin (MULTIVITAMIN WITH MINERALS) TABS tablet Take 1 tablet by mouth daily. 05/03/20  Yes Samella Parr, NP  pantoprazole (PROTONIX) 20 MG tablet TAKE 1 TABLET (20 MG TOTAL) BY MOUTH 2 (TWO) TIMES DAILY. Patient taking differently: Take 20 mg by mouth 2 (two) times daily. 05/03/20 05/03/21 Yes Samella Parr, NP  silodosin (RAPAFLO)  8 MG CAPS capsule Take 1 capsule (8 mg total) by mouth daily with breakfast. 07/07/20  Yes Sheikh, Omair Latif, DO  sodium bicarbonate 650 MG tablet TAKE 1 TABLET (650 MG TOTAL) BY MOUTH 2 (TWO) TIMES DAILY. Patient taking differently: Take 650 mg by mouth 2 (two) times daily. 05/03/20 05/03/21 Yes Samella Parr, NP  acetaminophen (TYLENOL) 500 MG tablet Take 1 tablet (500 mg total) by mouth every 6 (six) hours as needed for mild pain, fever or headache. Patient not taking: No sig reported 05/01/20   Samella Parr, NP  calcium carbonate (TUMS - DOSED IN MG ELEMENTAL CALCIUM) 500 MG chewable tablet Chew 1 tablet (200 mg of elemental calcium total) by mouth 3 (three) times daily. Patient not taking: No sig reported 05/01/20   Samella Parr, NP  feeding supplement (ENSURE ENLIVE / ENSURE PLUS) LIQD Take 237 mLs by mouth 2 (  two) times daily between meals. Patient not taking: Reported on 10/07/2020 07/07/20   Raiford Noble Latif, DO  fentaNYL (DURAGESIC) 25 MCG/HR Place 1 patch onto the skin every 3 (three) days. Patient not taking: Reported on 10/07/2020 07/08/20   Raiford Noble Latif, DO  folic acid (FOLVITE) 1 MG tablet Take 1 tablet (1 mg total) by mouth daily. Patient not taking: Reported on 10/07/2020 07/08/20   Raiford Noble Latif, DO  gabapentin (NEURONTIN) 100 MG capsule TAKE 2 CAPSULES (200 MG TOTAL) BY MOUTH DAILY. Patient not taking: Reported on 10/07/2020 05/03/20 05/03/21  Samella Parr, NP  gabapentin (NEURONTIN) 300 MG capsule TAKE 1 CAPSULE (300 MG TOTAL) BY MOUTH AT BEDTIME. Patient not taking: Reported on 10/07/2020 05/03/20 05/03/21  Samella Parr, NP  Nutritional Supplements (,FEEDING SUPPLEMENT, PROSOURCE PLUS) liquid Take 30 mLs by mouth 2 (two) times daily between meals. Patient not taking: Reported on 10/07/2020 07/07/20   Raiford Noble Latif, DO  polycarbophil (FIBERCON) 625 MG tablet Take 1 tablet (625 mg total) by mouth 2 (two) times daily. Patient not taking: Reported on 10/07/2020 05/01/20    Samella Parr, NP  polyethylene glycol (MIRALAX / GLYCOLAX) 17 g packet Take 17 g by mouth daily as needed for moderate constipation. Patient not taking: Reported on 10/07/2020 07/07/20   Raiford Noble Latif, DO  Vitamin D3 (VITAMIN D) 25 MCG tablet TAKE 800 UNITS BY MOUTH DAILY. Patient not taking: Reported on 10/07/2020 05/03/20 05/03/21  Samella Parr, NP    Social History   Socioeconomic History   Marital status: Widowed    Spouse name: Not on file   Number of children: Not on file   Years of education: Not on file   Highest education level: Not on file  Occupational History   Not on file  Tobacco Use   Smoking status: Never   Smokeless tobacco: Current  Vaping Use   Vaping Use: Never used  Substance and Sexual Activity   Alcohol use: Never   Drug use: Never   Sexual activity: Not on file  Other Topics Concern   Not on file  Social History Narrative   Widow, Civil Service fast streamer served in [employer] gets care through the [place] as well   2 sons   Lives alone, his mother has been helping him in 2021 with illnesses   Former smoker no alcohol  or drug use now   Does use smokeless tobacco   2 caffeinated beverages daily   Social Determinants of Radio broadcast assistant Strain: Not on file  Food Insecurity: Not on file  Transportation Needs: Not on file  Physical Activity: Not on file  Stress: Not on file  Social Connections: Not on file  Intimate Partner Violence: Not on file     Family History  Problem Relation Age of Onset   Renal cancer Father        mets to liver and lungs and pancreas   Heart disease Father    Heart disease Paternal Grandfather    Colon cancer Maternal Grandmother    Colon cancer Paternal Uncle    Stomach cancer Neg Hx    Esophageal cancer Neg Hx    Pancreatic cancer Neg Hx     ROS: [x]  Positive   [ ]  Negative   [ ]  All sytems reviewed and are negative  Cardiac: [x]  hx MI  Vascular: []  pain in legs while walking []  non-healing  ulcers  Pulmonary: []  asthma/wheezing []  home O2  Neurologic: [x]  hx of CVA   Hematologic: [x]  hx of cancer-Hodgkin's  Endocrine:   []  diabetes []  thyroid disease  GI [x]  GERD [x]  hx gastric bypass in 1994  GU: [x]  CKD/renal failure []  HD--[]  M/W/F or []  T/T/S  Psychiatric: []  anxiety []  depression  Musculoskeletal: []  arthritis []  joint pain  Integumentary: []  rashes []  ulcers  Constitutional: []  fever  []  chills   Physical Examination  Vitals:   10/13/20 0452 10/13/20 0935  BP: 98/65 108/62  Pulse: 64 (!) 57  Resp: 16 18  Temp: 98.2 F (36.8 C) 98.3 F (36.8 C)  SpO2: 98% 98%   Body mass index is 18.15 kg/m.  General:  WDWN in NAD Gait: Not observed HENT: WNL, normocephalic Pulmonary: normal non-labored breathing Cardiac: regular, Skin: without rashes Vascular Exam/Pulses:  Right Left  Radial 2+ (normal) 2+ (normal)  DP 3+ (hyperdynamic) 3+ (hyperdynamic)   Extremities: without ischemic changes, without Gangrene , without cellulitis; without open wounds Musculoskeletal: no muscle wasting or atrophy  Neurologic: A&O X 3; speech is fluent/normal Psychiatric:  The pt has  Flat  affect.   CBC    Component Value Date/Time   WBC 7.2 10/12/2020 0708   RBC 2.47 (L) 10/12/2020 0708   HGB 7.7 (L) 10/12/2020 0708   HCT 23.4 (L) 10/12/2020 0708   PLT 159 10/12/2020 0708   MCV 94.7 10/12/2020 0708   MCH 31.2 10/12/2020 0708   MCHC 32.9 10/12/2020 0708   RDW 16.4 (H) 10/12/2020 0708   LYMPHSABS 0.5 (L) 10/06/2020 1805   MONOABS 0.8 10/06/2020 1805   EOSABS 0.0 10/06/2020 1805   BASOSABS 0.0 10/06/2020 1805    BMET    Component Value Date/Time   NA 134 (L) 10/13/2020 0458   K 3.6 10/13/2020 0458   CL 103 10/13/2020 0458   CO2 22 10/13/2020 0458   GLUCOSE 124 (H) 10/13/2020 0458   BUN 25 (H) 10/13/2020 0458   CREATININE 4.14 (H) 10/13/2020 0458   CALCIUM 6.4 (LL) 10/13/2020 0458   GFRNONAA 16 (L) 10/13/2020 0458   GFRAA 12 (L)  01/04/2020 0453    COAGS: Lab Results  Component Value Date   INR 1.1 10/08/2020   INR 1.0 04/16/2020   INR 1.5 (H) 03/31/2020     Non-Invasive Vascular Imaging:   Vein mapping is pending  ASSESSMENT/PLAN: This is a 55 y.o. male with AKI on CKD V in need of permanent dialysis access and TDC  -pt is right hand dominant; he does have some mild residual left sided weakness.   -will tentatively plan for left arm access pending vein mapping results.  He does have an IV in the lower left arm.  Will have them remove this if vein mapping is adequate for left arm. -discussed AVF vs AVG;  discussed steal sx as well as all access will eventually need intervention or even new access at some point.  He expressed understanding.    Leontine Locket, PA-C [facility] [phone]   I have seen and evaluated the patient. I agree with the PA note as documented above.  55 year old male that vascular surgery has been consulted for permanent dialysis access and [facility] catheter placement.  Patient has a complex history of obstructive uropathy now with AKI on CKD.  He had bilateral nephrostomy tubes placed given hydronephrosis even with Foley and ureteral stents.  He is right-handed.  Appears to have a good cephalic vein on exam.  We will plan left arm AV fistula versus graft  with TDC on Monday tentatively.  Risk benefits discussed.  [name], MD [facility] [place] Office: [phone]

## 2020-10-13 NOTE — Progress Notes (Signed)
Per patient had glasses earlier  brown frame by Elenora Gamma with a rope tied to them so could hang around neck.  Informed Laundry who sent and email to cleaning provider and will follow up.

## 2020-10-13 NOTE — Progress Notes (Signed)
OT Cancellation Note  Patient Details Name: Chase [name] MRN: [id number] DOB: 07-28-1965   Cancelled Treatment:    Reason Eval/Treat Not Completed: Patient at procedure or test/ unavailable;Other (comment) Pt undergoing vein mapping with vascular at bedside and MD entering room. Will follow-up again for OT session as pt available.   Layla Maw 10/13/2020, 1:42 PM

## 2020-10-13 NOTE — Progress Notes (Signed)
   10/13/20 2133  Provider Notification  Provider Name/Title Dr. Tonie Griffith  Date Provider Notified 10/13/20  Time Provider Notified 2134  Notification Type Page  Notification Reason Critical result  Test performed and critical result calcium 6.3  Date Critical Result Received 10/13/20  Time Critical Result Received 2015  Provider response See new orders  Date of Provider Response 10/13/20  Time of Provider Response 2137

## 2020-10-13 NOTE — TOC Progression Note (Addendum)
Transition of Care [facility]) - Progression Note    Patient Details  Name: Chase [name] MRN: [id number] Date of Birth: 21-Apr-1965  [facility] CM/SW Contact  Sharlet Salina Mila Homer, LCSW Phone Number: 10/13/2020, 7:12 PM  Clinical Narrative:   CSW advised by patient's nurse that his son Kenric Ginger requested to talk with a Education officer, museum. Attempted to reach [phone] (7:10 pm) and message left that someone would contact him.   7:18 pm: Talked at the bedside with patient regarding his son wanting to speak with a Education officer, museum. Patient gave permission to talk with son and added that he makes his own decisions. CSW expressed understanding and agreement and informed him that we wanted to ensure that it was ok to speak with his son.   Expected Discharge Plan: [place] Barriers to Discharge: Continued Medical Work up  Expected Discharge Plan and Services Expected Discharge Plan: [place]   Discharge Planning Services: CM Consult Post Acute Care Choice: [place] arrangements for the past 2 months: Single Family Home                           HH Arranged: RN, PT, OT, Nurse's Aide HH Agency: [place] Date [facility] Agency Contacted: 10/12/20 Time [place]: 1200 Representative spoke with at [place]: [place] (Delia) Interventions    Readmission Risk Interventions Readmission Risk Prevention Plan 07/04/2020 12/25/2019 11/19/2019  Transportation Screening Complete Complete Complete  Medication Review Press photographer) Complete Complete Complete  PCP or Specialist appointment within 3-5 days of discharge Complete - Complete  HRI or Home Care Consult Complete - Complete  SW Recovery Care/Counseling Consult Complete - Complete  Palliative Care Screening Not Applicable - Not Applicable  Skilled Nursing Facility Not Applicable - Complete  Some recent data might be hidden

## 2020-10-13 NOTE — Progress Notes (Signed)
Upper extremity vein mapping study completed.   Please see CV Proc for preliminary results.   Minas Bonser, RDMS, RVT  

## 2020-10-13 NOTE — Progress Notes (Signed)
[facility] Progress Note  Name: Chase [name] MRN: [id number] DOB: June 04, 1965  Chief Complaint:  AMS  Subjective:  Feeling fine, doesn't like food.  No complaints offered.  24h crcl 3 - discussed ESRD status.  Agreeable to long term dialysis still, perm access placement.    ------------- Background on referral: Chase [name] is a 55 y.o. 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  He was supposed to have a TURP late this week and didn't show up for procedure or pre-op labs.  He was then found to be unresponsive on a welfare check.  He has an indwelling foley.  Nephrology consulted.  Recommended transfer to [facility] for nephrology and urologic care.  I last saw him in 01/02/20 inpatient and he was lost to follow-up with [facility] after that hospitalization after missing multiple appointments.  He did also not establish care with nephrology at the [facility].  He has had interim hospitalizations including one with CRRT.  At a SNF at one point but most recently at home.  Blood pressure has been low range 90's and HR has been 40's most recently from 50's earlier.  He has been on bicarb gtt.   Dr. Royce Macadamia had an extensive conversation with the patient, his son (present via phone), and his nurse.  We discussed the risks/benefits/indications of dialysis/RRT.  They do wish to pursue dialysis and consent for renal replacement therapy.  The patient wishes it hadn't come to this but is not ready to transition to comfort care.  He wants to live independently but his son discussed that that hadn't been going well.   Intake/Output Summary (Last 24 hours) at 10/13/2020 0920 Last data filed at 10/13/2020 0048 Gross per 24 hour  Intake 270 ml  Output 825 ml  Net -555 ml     Vitals:  Vitals:   10/12/20 1020 10/12/20 1639 10/12/20 2047 10/13/20 0452  BP: 93/60 (!) 92/57 107/65 98/65  Pulse:  (!) 58 63 66 64  Resp: 16 15 20 16   Temp: 98.6 F (37 C) 97.8 F (36.6 C) 98.4 F (36.9 C) 98.2 F (36.8 C)  TempSrc: Oral Oral Oral Oral  SpO2: 97% 97% 98% 98%  Weight:      Height:         Physical Exam:  General: thin chronically ill appearing gentleman  HEENT: [place] AT Eyes: EOMI, anicteric Neck: supple trachea midline Heart: RRR on monitor Lungs: normal WOB on RA Extremities: no edema  GU foley in place Access: left IJ nontunneled catheter  Medications reviewed   Labs:  BMP Latest Ref Rng & Units 10/13/2020 10/12/2020 10/12/2020  Glucose 70 - 99 mg/dL 124(H) 208(H) 102(H)  BUN 6 - 20 mg/dL 25(H) 22(H) 17  Creatinine 0.61 - 1.24 mg/dL 4.14(H) 3.79(H) 3.11(H)  Sodium 135 - 145 mmol/L 134(L) 136 135  Potassium 3.5 - 5.1 mmol/L 3.6 3.7 3.7  Chloride 98 - 111 mmol/L 103 106 103  CO2 22 - 32 mmol/L 22 22 25   Calcium 8.9 - 10.3 mg/dL 6.4(LL) 6.4(LL) 6.6(L)     Assessment/Plan:   # AKI on CKD stage V - hx of advanced CKD and chronic obstruction and chronic indwelling foley.  Presented with severe AKI requiring CRRT 7/9. Had BL hydronephrosis despite foley and ureteral stents so underwent nephrostomy tube placement as well.   - Off CRRT since 7/11, had HD for clearance 7/13; will plan  next HD tomorrow. - given 24h crcl 3 will proceed to make plans for ESRD/long term dialysis -- vein map, VVS consult re: perm access and TDC placement; discussed with pt and he's agreeable -Outpt CLIP underway   # Chronic urinary retention: missed scheduled TURP per charting - s/p BL nephrostomy tubes this admission - plan per urology for nephrostomy tubes   # Hx CVA - left hemiparesis - previously at SNF and noted he presented from home most recently   # Anemia - normocytic.  Has rec'd 2u pRBC.   Started ESA 7/11.   #BMM: check PTH, corr ca a bit low - supplement, phos ok.    Insists upon return home vs SNF - concern expressed about feasibility of plan but he insists with home health and  [facility] transport to dialysis he willl be ok.  Justin Mend, MD 10/13/2020 9:20 AM

## 2020-10-13 NOTE — Progress Notes (Signed)
Patient ID: RONDRICK BARREIRA, male   DOB: Apr 07, 1965, 55 y.o.   MRN: [id number]   Notes from today reviewed.  It appears that Mr. Hartshorne will now be dialysis-dependent moving forward.  As such, urologic measures to specifically preserve/maintain his renal function are no longer a priority.  Rather, we will try to minimize his risk of recurrent infection and improve his quality of life with regard to drains/catheters.  I will discuss options with him early next week and see how he would like to proceed.

## 2020-10-13 NOTE — Progress Notes (Signed)
PROGRESS NOTE    Chase [name]  GNF:[id number] DOB: 1965/04/05 DOA: 10/06/2020 PCP: [name]   Brief Narrative:  Chase [name] is a 55 y.o. male with a PMH significant for CKD stage V, hx of GIB and ulcers requiring gastric bypass, prior stroke, hodgkin's lymphoma, prior MI, cervical radiculopathy, chronic pain syndrome, COVID, and PTSD who presented to the ED via EMS after being found minimally responsive on welfare check. On EMS arrival patient was found in recliner alert but confused. On chart review it appears patient was scheduled for pre op lab work 7/6 and TURP 7/7 but failed to show for either appointment. Patient has an extensive past medical history to include CKD stage 5 that progressed to acute renal failure march of this year and required CRRT. He also has indwelling catheter and bilateral nephrostomy tubes for hydronephrosis. He presents altered but in stable condition with no immediate needs for hemodialysis. Acidosis has improved with bicarb drip. Nephrology has already been consulted.  Assessment & Plan:   Principal Problem:   Acute renal failure superimposed on stage 4 chronic kidney disease (HCC) Active Problems:   BPH (benign prostatic hyperplasia)   Postlaminectomy syndrome, not elsewhere classified   PTSD (post-traumatic stress disorder)   AKI (acute kidney injury) ([place])   Anxiety and depression   Anemia in chronic kidney disease   Acute metabolic encephalopathy   Acute renal failure (ARF) (HCC)   Acute on chronic renal failure - CKD-V  Progressing to ESRD transitioning to dialysis - Related to some issue with GU drainage and presumed worsening prostatic/obstruction given outpatient plan TURP and missed appointment - Status post bilateral nephrostomy tubes - previously requiring CRRT (off as of 10/09/20).  HD x1 on 7/13 for clearance -Creatinine clearance appears to be quite abysmal per additional testing with nephrology notably as low as 6,  nephrology transitioning patient to intermittent hemodialysis with plans for catheter placement today, additional dialysis schedule per themselves.  Will need clipping prior to discharge  Chronic anemia, likely of chronic disease and malnutrition - Received 2 units pRBC 7/11 -Hemoglobin stabilizing, continue to follow and transfuse as indicated  Failure to thrive, severe protein calorie malnutrition - Continue to encourage PO intake - liberalize diet and increase PT/OT  Hypotension:  - Related to chronic wasting and acute acidemia, improved, off pressors. Now on midodrine.  Recurrent diverticulitis with abscess formation leading to complex urological obstruction- s/p diverting ostomy with [facility] resection of redundant sigmoid colon.  Unclear if he will ever be candidate for re-anastamosis to rectal stump.  DVT prophylaxis: holding in the setting of anemia Code Status: DNR Family Communication: None present  Status is: Inpt  Dispo: The patient is from: Home              Anticipated d/c is to: TBD - requesting DC home when possible              Anticipated d/c date is: >7h              Patient currently NOT medically stable for discharge -we will need to transition to intermittent hemodialysis, will require dialysis catheter placement as well as clipping for safe disposition.  Consultants:  Nephrology, Urology  Antimicrobials:  None   Subjective: No acute issues/events overnight -urine output remains markedly low, lengthy discussion today about transitioning to dialysis, patient understands the need for dialysis but is hopeful for renal recovery.  Otherwise denies nausea vomiting diarrhea constipation headache fevers chills chest pain shortness of  breath.  Objective: Vitals:   10/12/20 1020 10/12/20 1639 10/12/20 2047 10/13/20 0452  BP: 93/60 (!) 92/57 107/65 98/65  Pulse: (!) 58 63 66 64  Resp: 16 15 20 16   Temp: 98.6 F (37 C) 97.8 F (36.6 C) 98.4 F (36.9 C) 98.2 F  (36.8 C)  TempSrc: Oral Oral Oral Oral  SpO2: 97% 97% 98% 98%  Weight:      Height:        Intake/Output Summary (Last 24 hours) at 10/13/2020 0757 Last data filed at 10/13/2020 0048 Gross per 24 hour  Intake 270 ml  Output 825 ml  Net -555 ml    Filed Weights   10/11/20 0500 10/11/20 1200 10/11/20 1505  Weight: 49.3 kg 52 kg 51 kg    Examination:  General:  Pleasantly resting in bed, No acute distress. HEENT:  Normocephalic atraumatic.  Sclerae nonicteric, noninjected.  Extraocular movements intact bilaterally. Neck:  Without mass or deformity.  Trachea is midline. Lungs:  Clear to auscultate bilaterally without rhonchi, wheeze, or rales. Heart:  Regular rate and rhythm.  Without murmurs, rubs, or gallops. Abdomen:  Soft, nontender, nondistended.  Without guarding or rebound. Nephrostomy tube -minimal dark yellow urine output; ostomy in place Extremities: Without cyanosis, clubbing, edema, or obvious deformity. Vascular: Dorsalis pedis and posterior tibial pulses palpable bilaterally. Skin: Warm and dry, no erythema, no ulcerations.   Data Reviewed: I have personally reviewed following labs and imaging studies  CBC: Recent Labs  Lab 10/06/20 1805 10/07/20 0535 10/08/20 1511 10/09/20 0302 10/09/20 1355 10/10/20 0330 10/11/20 1100 10/12/20 0708  WBC 6.0   < > 4.1 7.5  --  8.5 9.7 7.2  NEUTROABS 4.7  --   --   --   --   --   --   --   HGB 9.3*   < > 9.2* 6.7* 6.6* 7.6* 7.3* 7.7*  HCT 29.0*   < > 26.3* 19.5* 20.0* 22.3* 22.3* 23.4*  MCV 97.3   < > 91.3 92.0  --  91.8 95.3 94.7  PLT 233   < > 162 137*  --  106* 155 159   < > = values in this interval not displayed.    Basic Metabolic Panel: Recent Labs  Lab 10/09/20 0302 10/09/20 1355 10/10/20 0309 10/10/20 1801 10/11/20 0319 10/11/20 1555 10/12/20 0708 10/12/20 1625 10/13/20 0458  NA 136   < > 135   < > 135 137 135 136 134*  K 4.1   < > 3.7   < > 3.7 3.8 3.7 3.7 3.6  CL 102   < > 103   < > 106 102 103  106 103  CO2 25   < > 25   < > 22 28 25 22 22   GLUCOSE 100*   < > 108*   < > 99 90 102* 208* 124*  BUN 17   < > 20   < > 28* 10 17 22* 25*  CREATININE 2.17*   < > 3.07*   < > 4.05* 1.99* 3.11* 3.79* 4.14*  CALCIUM 7.2*   < > 6.9*   < > 6.5* 6.9* 6.6* 6.4* 6.4*  MG 2.2  --  2.2  --  2.4  --  2.0  --  2.1  PHOS 3.5   < > 4.3   < > 5.6* 2.5 3.4 3.9 4.2   < > = values in this interval not displayed.    GFR: Estimated Creatinine Clearance: 14.4 mL/min (A) (by  C-G formula based on SCr of 4.14 mg/dL (H)). Liver Function Tests: Recent Labs  Lab 10/06/20 1805 10/07/20 0535 10/07/20 1719 10/11/20 0319 10/11/20 1555 10/12/20 0708 10/12/20 1625 10/13/20 0458  AST 14* 16  --   --   --   --   --   --   ALT 54* 40  --   --   --   --   --   --   ALKPHOS 129* 94  --   --   --   --   --   --   BILITOT 0.9 1.1  --   --   --   --   --   --   PROT 7.6 5.8*  --   --   --   --   --   --   ALBUMIN 3.4* 2.7*   < > 1.8* 1.9* 1.9* 1.9* 1.8*   < > = values in this interval not displayed.    No results for input(s): LIPASE, AMYLASE in the last 168 hours. No results for input(s): AMMONIA in the last 168 hours. Coagulation Profile: Recent Labs  Lab 10/08/20 0811  INR 1.1    Cardiac Enzymes: No results for input(s): CKTOTAL, CKMB, CKMBINDEX, TROPONINI in the last 168 hours. BNP (last 3 results) No results for input(s): PROBNP in the last 8760 hours. HbA1C: No results for input(s): HGBA1C in the last 72 hours. CBG: Recent Labs  Lab 10/06/20 1905 10/09/20 1957  GLUCAP 115* 121*    Lipid Profile: No results for input(s): CHOL, HDL, LDLCALC, TRIG, CHOLHDL, LDLDIRECT in the last 72 hours. Thyroid Function Tests: No results for input(s): TSH, T4TOTAL, FREET4, T3FREE, THYROIDAB in the last 72 hours. Anemia Panel: No results for input(s): VITAMINB12, FOLATE, FERRITIN, TIBC, IRON, RETICCTPCT in the last 72 hours. Sepsis Labs: Recent Labs  Lab 10/06/20 1746 10/06/20 2312 10/07/20 0535  10/08/20 0309  PROCALCITON  --  0.17 0.17 0.13  LATICACIDVEN 1.0  --   --   --      Recent Results (from the past 240 hour(s))  SARS CORONAVIRUS 2 (TAT 6-24 HRS) Nasopharyngeal Nasopharyngeal Swab     Status: None   Collection Time: 10/03/20 10:20 AM   Specimen: Nasopharyngeal Swab  Result Value Ref Range Status   SARS Coronavirus 2 NEGATIVE NEGATIVE Final    Comment: (NOTE) SARS-CoV-2 target nucleic acids are NOT DETECTED.  The SARS-CoV-2 RNA is generally detectable in upper and lower respiratory specimens during the acute phase of infection. Negative results do not preclude SARS-CoV-2 infection, do not rule out co-infections with other pathogens, and should not be used as the sole basis for treatment or other patient management decisions. Negative results must be combined with clinical observations, patient history, and epidemiological information. The expected result is Negative.  Fact Sheet for Patients: SugarRoll.be  Fact Sheet for Healthcare Providers: https://www.woods-mathews.com/  This test is not yet approved or cleared by the [place] FDA and  has been authorized for detection and/or diagnosis of SARS-CoV-2 by FDA under an Emergency Use Authorization (EUA). This EUA will remain  in effect (meaning this test can be used) for the duration of the COVID-19 declaration under Se ction 564(b)(1) of the Act, 21 U.S.C. section 360bbb-3(b)(1), unless the authorization is terminated or revoked sooner.  Performed at [facility], [place] [address] [place], [place] [address]   Culture, blood (routine x 2)     Status: None   Collection Time: 10/06/20  5:47 PM  Specimen: BLOOD RIGHT FOREARM  Result Value Ref Range Status   Specimen Description BLOOD RIGHT FOREARM  Final   Special Requests   Final    Blood Culture results may not be optimal due to an inadequate volume of blood received in culture bottles BOTTLES DRAWN  AEROBIC AND ANAEROBIC   Culture   Final    NO GROWTH 5 DAYS Performed at [facility], [address], [place], [place] [address]    Report Status 10/11/2020 FINAL  Final  Culture, blood (routine x 2)     Status: None   Collection Time: 10/06/20  6:06 PM   Specimen: BLOOD RIGHT ARM  Result Value Ref Range Status   Specimen Description BLOOD RIGHT ARM  Final   Special Requests   Final    BOTTLES DRAWN AEROBIC AND ANAEROBIC Blood Culture adequate volume   Culture   Final    NO GROWTH 5 DAYS Performed at [facility], [address] [place], [place] [address]    Report Status 10/11/2020 FINAL  Final  Urine culture     Status: Abnormal   Collection Time: 10/06/20  7:31 PM   Specimen: Urine, Clean Catch  Result Value Ref Range Status   Specimen Description   Final    URINE, CLEAN CATCH Performed at [facility], [address] [place], [place] [address]    Special Requests   Final    NONE Performed at [facility], [address], [place], [place] [address]    Culture MULTIPLE SPECIES PRESENT, SUGGEST RECOLLECTION (A)  Final   Report Status 10/08/2020 FINAL  Final  Resp Panel by RT-PCR (Flu A&B, Covid) Nasopharyngeal Swab     Status: None   Collection Time: 10/06/20  8:10 PM   Specimen: Nasopharyngeal Swab; Nasopharyngeal(NP) swabs in vial transport medium  Result Value Ref Range Status   SARS Coronavirus 2 by RT PCR NEGATIVE NEGATIVE Final    Comment: (NOTE) SARS-CoV-2 target nucleic acids are NOT DETECTED.  The SARS-CoV-2 RNA is generally detectable in upper respiratory specimens during the acute phase of infection. The lowest concentration of SARS-CoV-2 viral copies this assay can detect is 138 copies/mL. A negative result does not preclude SARS-Cov-2 infection and should not be used as the sole basis for treatment or other patient management decisions. A negative result may occur with  improper specimen collection/handling, submission of specimen other than nasopharyngeal  swab, presence of viral mutation(s) within the areas targeted by this assay, and inadequate number of viral copies(<138 copies/mL). A negative result must be combined with clinical observations, patient history, and epidemiological information. The expected result is Negative.  Fact Sheet for Patients:  EntrepreneurPulse.com.au  Fact Sheet for Healthcare Providers:  IncredibleEmployment.be  This test is no t yet approved or cleared by the [place] FDA and  has been authorized for detection and/or diagnosis of SARS-CoV-2 by FDA under an Emergency Use Authorization (EUA). This EUA will remain  in effect (meaning this test can be used) for the duration of the COVID-19 declaration under Section 564(b)(1) of the Act, 21 U.S.C.section 360bbb-3(b)(1), unless the authorization is terminated  or revoked sooner.       Influenza A by PCR NEGATIVE NEGATIVE Final   Influenza B by PCR NEGATIVE NEGATIVE Final    Comment: (NOTE) The Xpert Xpress SARS-CoV-2/FLU/RSV plus assay is intended as an aid in the diagnosis of influenza from Nasopharyngeal swab specimens and should not be used as a sole basis for treatment. Nasal washings and aspirates are unacceptable  for Xpert Xpress SARS-CoV-2/FLU/RSV testing.  Fact Sheet for Patients: EntrepreneurPulse.com.au  Fact Sheet for Healthcare Providers: IncredibleEmployment.be  This test is not yet approved or cleared by the [place] FDA and has been authorized for detection and/or diagnosis of SARS-CoV-2 by FDA under an Emergency Use Authorization (EUA). This EUA will remain in effect (meaning this test can be used) for the duration of the COVID-19 declaration under Section 564(b)(1) of the Act, 21 U.S.C. section 360bbb-3(b)(1), unless the authorization is terminated or revoked.  Performed at [facility], [address] [place], [place] [address]   MRSA Next Gen by  PCR, Nasal     Status: None   Collection Time: 10/07/20  1:37 PM   Specimen: Nasal Mucosa; Nasal Swab  Result Value Ref Range Status   MRSA by PCR Next Gen NOT DETECTED NOT DETECTED Final    Comment: (NOTE) The GeneXpert MRSA Assay (FDA approved for NASAL specimens only), is one component of a comprehensive MRSA colonization surveillance program. It is not intended to diagnose MRSA infection nor to guide or monitor treatment for MRSA infections. Test performance is not FDA approved in patients less than 22 years old. Performed at [facility], [place] [address], [place], [place] [address]   Aerobic/Anaerobic Culture w Gram Stain (surgical/deep wound)     Status: None (Preliminary result)   Collection Time: 10/08/20 12:02 PM   Specimen: Urine, Random  Result Value Ref Range Status   Specimen Description URINE, RANDOM  Final   Special Requests LEFT KIDNEY  Final   Gram Stain   Final    ABUNDANT WBC PRESENT,BOTH PMN AND MONONUCLEAR ABUNDANT GRAM VARIABLE ROD    Culture   Final    ABUNDANT ESCHERICHIA COLI ABUNDANT PSEUDOMONAS AERUGINOSA ABUNDANT ENTEROCOCCUS FAECIUM SUSCEPTIBILITIES TO FOLLOW NO ANAEROBES ISOLATED; CULTURE IN PROGRESS FOR 5 DAYS    Report Status PENDING  Incomplete   Organism ID, Bacteria ESCHERICHIA COLI  Final   Organism ID, Bacteria ENTEROCOCCUS FAECIUM  Final      Susceptibility   Escherichia coli - MIC*    AMPICILLIN <=2 SENSITIVE Sensitive     CEFAZOLIN <=4 SENSITIVE Sensitive     CEFEPIME <=0.12 SENSITIVE Sensitive     CEFTRIAXONE <=0.25 SENSITIVE Sensitive     CIPROFLOXACIN <=0.25 SENSITIVE Sensitive     GENTAMICIN <=1 SENSITIVE Sensitive     IMIPENEM <=0.25 SENSITIVE Sensitive     NITROFURANTOIN <=16 SENSITIVE Sensitive     TRIMETH/SULFA <=20 SENSITIVE Sensitive     AMPICILLIN/SULBACTAM <=2 SENSITIVE Sensitive     PIP/TAZO Value in next row Sensitive      <=4 SENSITIVEPerformed at [place] [facility] [address], [place], [place] [address]     * ABUNDANT ESCHERICHIA COLI   Enterococcus faecium - MIC*    AMPICILLIN Value in next row Resistant      <=4 SENSITIVEPerformed at [facility] [address] [place], [place] [address]    NITROFURANTOIN Value in next row Sensitive      <=4 SENSITIVEPerformed at [place] [address] [place], [place] [address]    VANCOMYCIN Value in next row Sensitive      <=4 SENSITIVEPerformed at [place] [address], [place], [place] [address]    * ABUNDANT ENTEROCOCCUS FAECIUM     Radiology Studies: No results found.  Scheduled Meds:  calcium carbonate  1 tablet Oral TID WC   Chlorhexidine Gluconate Cloth  6 each Topical Daily  Chlorhexidine Gluconate Cloth  6 each Topical Q0600   [START ON 10/16/2020] darbepoetin (ARANESP) injection - DIALYSIS  60 mcg Subcutaneous Q7 days   mouth rinse  15 mL Mouth Rinse BID   midodrine  10 mg Oral Q T,Th,Sa-HD   multivitamin with minerals  1 tablet Oral BID   pantoprazole  40 mg Oral Q1200   sodium chloride flush  5 mL Intracatheter Q8H   Continuous Infusions:  sodium chloride 250 mL (10/09/20 1732)   norepinephrine (LEVOPHED) Adult infusion Stopped (10/08/20 0401)     LOS: 7 days   Time spent: 30 min  Little Ishikawa, DO [facility]  If 7PM-7AM, please contact night-coverage [email or web address]  10/13/2020, 7:57 AM

## 2020-10-14 DIAGNOSIS — G9341 Metabolic encephalopathy: Secondary | ICD-10-CM | POA: Diagnosis not present

## 2020-10-14 DIAGNOSIS — F419 Anxiety disorder, unspecified: Secondary | ICD-10-CM | POA: Diagnosis not present

## 2020-10-14 DIAGNOSIS — N185 Chronic kidney disease, stage 5: Secondary | ICD-10-CM | POA: Diagnosis not present

## 2020-10-14 DIAGNOSIS — N179 Acute kidney failure, unspecified: Secondary | ICD-10-CM | POA: Diagnosis not present

## 2020-10-14 DIAGNOSIS — N139 Obstructive and reflux uropathy, unspecified: Secondary | ICD-10-CM | POA: Diagnosis not present

## 2020-10-14 LAB — RENAL FUNCTION PANEL
Albumin: 1.8 g/dL — ABNORMAL LOW (ref 3.5–5.0)
Albumin: 1.8 g/dL — ABNORMAL LOW (ref 3.5–5.0)
Anion gap: 8 (ref 5–15)
Anion gap: 7 (ref 5–15)
BUN: 38 mg/dL — ABNORMAL HIGH (ref 6–20)
BUN: 42 mg/dL — ABNORMAL HIGH (ref 6–20)
CO2: 20 mmol/L — ABNORMAL LOW (ref 22–32)
CO2: 21 mmol/L — ABNORMAL LOW (ref 22–32)
Calcium: 6.6 mg/dL — ABNORMAL LOW (ref 8.9–10.3)
Calcium: 6.4 mg/dL — CL (ref 8.9–10.3)
Chloride: 106 mmol/L (ref 98–111)
Chloride: 108 mmol/L (ref 98–111)
Creatinine, Ser: 5.14 mg/dL — ABNORMAL HIGH (ref 0.61–1.24)
Creatinine, Ser: 5.56 mg/dL — ABNORMAL HIGH (ref 0.61–1.24)
GFR, Estimated: 12 mL/min — ABNORMAL LOW (ref >60)
GFR, Estimated: 11 mL/min — ABNORMAL LOW (ref >60)
Glucose, Bld: 96 mg/dL (ref 70–99)
Glucose, Bld: 89 mg/dL (ref 70–99)
Phosphorus: 4.9 mg/dL — ABNORMAL HIGH (ref 2.5–4.6)
Phosphorus: 4.8 mg/dL — ABNORMAL HIGH (ref 2.5–4.6)
Potassium: 4 mmol/L (ref 3.5–5.1)
Potassium: 4.2 mmol/L (ref 3.5–5.1)
Sodium: 134 mmol/L — ABNORMAL LOW (ref 135–145)
Sodium: 136 mmol/L (ref 135–145)

## 2020-10-14 LAB — CBC
HCT: 22.5 % — ABNORMAL LOW (ref 39.0–52.0)
Hemoglobin: 7.2 g/dL — ABNORMAL LOW (ref 13.0–17.0)
MCH: 31.9 pg (ref 26.0–34.0)
MCHC: 32 g/dL (ref 30.0–36.0)
MCV: 99.6 fL (ref 80.0–100.0)
Platelets: 170 10*3/uL (ref 150–400)
RBC: 2.26 MIL/uL — ABNORMAL LOW (ref 4.22–5.81)
RDW: 16 % — ABNORMAL HIGH (ref 11.5–15.5)
WBC: 6.1 10*3/uL (ref 4.0–10.5)
nRBC: 0 % (ref 0.0–0.2)

## 2020-10-14 LAB — AEROBIC/ANAEROBIC CULTURE W GRAM STAIN (SURGICAL/DEEP WOUND)

## 2020-10-14 LAB — MAGNESIUM: Magnesium: 2.2 mg/dL (ref 1.7–2.4)

## 2020-10-14 LAB — VITAMIN C: Vitamin C: 0.1 mg/dL — ABNORMAL LOW (ref 0.4–2.0)

## 2020-10-14 LAB — GLUCOSE, CAPILLARY: Glucose-Capillary: 116 mg/dL — ABNORMAL HIGH (ref 70–99)

## 2020-10-14 NOTE — TOC Progression Note (Signed)
Transition of Care [facility]) - Progression Note    Patient Details  Name: Chase [name] MRN: [id number] Date of Birth: Aug 23, 1965  Transition of Care [facility]) CM/SW [place], [place] Phone Number: 10/14/2020, 10:53 AM  Clinical Narrative:    CSW was notified that pt's son would like to speak with SW. CSW spoke with [name] who noted that he has concerns about his father returning home and that he would want to be able to make medical decisions, as his father is refusing SNF and other resources. CSW noted that pt is alert and oriented, and advised son that at this time pt is competent to make decisions, whether they are what the family would want or not. Pt was advised of resources that he could utilize to ensure safety for his father. CSW also requested a SW to follow outpatient for needs. CSW confirmed that MD has no concerns about capacity at this time. Plan is still to move forward with [place] at this time. TOC will continue to follow for DC needs.   Expected Discharge Plan: [place] Barriers to Discharge: Continued Medical Work up  Expected Discharge Plan and Services Expected Discharge Plan: [place]   Discharge Planning Services: CM Consult Post Acute Care Choice: [place] arrangements for the past 2 months: Single Family Home                           HH Arranged: RN, PT, OT, Nurse's Aide HH Agency: [place] Date [facility] Agency Contacted: 10/12/20 Time [place]: 1200 Representative spoke with at [place]: [place] ([place]) Interventions    Readmission Risk Interventions Readmission Risk Prevention Plan 07/04/2020 12/25/2019 11/19/2019  Transportation Screening Complete Complete Complete  Medication Review Press photographer) Complete Complete Complete  PCP or Specialist appointment within 3-5 days of discharge Complete - Complete  HRI or Home Care Consult Complete - Complete   SW Recovery Care/Counseling Consult Complete - Complete  Palliative Care Screening Not Applicable - Not Applicable  Skilled Nursing Facility Not Applicable - Complete  Some recent data might be hidden

## 2020-10-14 NOTE — Progress Notes (Signed)
[facility] Progress Note  Name: Chase [name] MRN: [id number] DOB: Oct 15, 1965  Chief Complaint:  AMS  Subjective:  Seems agitated this AM - saying he doesn't feel like doing dialysis today and may decline; I encouraged him to do it and reconsider if he wishes to do it long term.   No new complaints otherwise.    ------------- Background on referral: Chase [name] is a 55 y.o. 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  He was supposed to have a TURP late this week and didn't show up for procedure or pre-op labs.  He was then found to be unresponsive on a welfare check.  He has an indwelling foley.  Nephrology consulted.  Recommended transfer to [facility] for nephrology and urologic care.  I last saw him in 01/02/20 inpatient and he was lost to follow-up with [facility] after that hospitalization after missing multiple appointments.  He did also not establish care with nephrology at the [facility].  He has had interim hospitalizations including one with CRRT.  At a SNF at one point but most recently at home.  Blood pressure has been low range 90's and HR has been 40's most recently from 50's earlier.  He has been on bicarb gtt.   Dr. Royce Macadamia had an extensive conversation with the patient, his son (present via phone), and his nurse.  We discussed the risks/benefits/indications of dialysis/RRT.  They do wish to pursue dialysis and consent for renal replacement therapy.  The patient wishes it hadn't come to this but is not ready to transition to comfort care.  He wants to live independently but his son discussed that that hadn't been going well.   Intake/Output Summary (Last 24 hours) at 10/14/2020 1005 Last data filed at 10/14/2020 0430 Gross per 24 hour  Intake 1140 ml  Output 1525 ml  Net -385 ml     Vitals:  Vitals:   10/13/20 0935 10/13/20 1700 10/13/20 2022 10/14/20 0553   BP: 108/62 111/71 99/63 117/74  Pulse: (!) 57 61 62 62  Resp: 18 17 19 18   Temp: 98.3 F (36.8 C) 98.1 F (36.7 C) 98.4 F (36.9 C) 98.4 F (36.9 C)  TempSrc: Oral Oral Oral Oral  SpO2: 98% 99% 100% 100%  Weight:      Height:         Physical Exam:  General: thin chronically ill appearing gentleman  HEENT: [place] AT Eyes: EOMI, anicteric Neck: supple trachea midline Heart: RRR on monitor Lungs: normal WOB on RA Extremities: no edema  GU foley in place Access: left IJ nontunneled catheter  Medications reviewed   Labs:  BMP Latest Ref Rng & Units 10/14/2020 10/13/2020 10/13/2020  Glucose 70 - 99 mg/dL 96 109(H) 124(H)  BUN 6 - 20 mg/dL 38(H) 36(H) 25(H)  Creatinine 0.61 - 1.24 mg/dL 5.14(H) 4.96(H) 4.14(H)  Sodium 135 - 145 mmol/L 134(L) 135 134(L)  Potassium 3.5 - 5.1 mmol/L 4.0 4.8 3.6  Chloride 98 - 111 mmol/L 106 107 103  CO2 22 - 32 mmol/L 20(L) 22 22  Calcium 8.9 - 10.3 mg/dL 6.6(L) 6.3(LL) 6.4(LL)     Assessment/Plan:   # 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  Presented with severe AKI requiring CRRT 7/9. Had BL hydronephrosis despite foley and ureteral stents so underwent nephrostomy tube placement as well.  Despite decompression with good UOP 24h CrCl 3 consistent with ESRD.  - Off CRRT since 7/11, had HD for clearance 7/13; HD today then cont TIW with next planned Tues unless outpt schedule dictates change. --discussed ESRD vs conservative care with him again today - cont with HD for now -- Appreicate [facility] consult re: perm access and TDC placement; planned for Mon -Outpt CLIP underway   # Chronic urinary retention: missed scheduled TURP per charting - s/p BL nephrostomy tubes this admission - plan per urology for nephrostomy tubes --> given committed to dialysis now plan will be maintain comfort and avoid infection   # Hx CVA - left hemiparesis - previously at SNF and noted he presented from  home most recently   # Anemia - normocytic.  Has rec'd 2u pRBC.   Started ESA 7/11.   #BMM: checking PTH, corr ca a bit low - supplement, phos ok.    Insists upon return home vs SNF - concern expressed about feasibility of plan but he insists with home health and [facility] transport to dialysis he willl be ok.  Chase Mend, MD 10/14/2020 10:05 AM

## 2020-10-14 NOTE — Progress Notes (Signed)
PROGRESS NOTE    Chase [name]  DEY:[id number] DOB: 1965/12/12 DOA: 10/06/2020 PCP: [name]   Brief Narrative:  Chase [name] is a 55 y.o. male with a PMH significant for CKD stage V, hx of GIB and ulcers requiring gastric bypass, prior stroke, hodgkin's lymphoma, prior MI, cervical radiculopathy, chronic pain syndrome, COVID, and PTSD who presented to the ED via EMS after being found minimally responsive on welfare check. On EMS arrival patient was found in recliner alert but confused. On chart review it appears patient was scheduled for pre op lab work 7/6 and TURP 7/7 but failed to show for either appointment. Patient has an extensive past medical history to include CKD stage 5 that progressed to acute renal failure march of this year and required CRRT. He also has indwelling catheter and bilateral nephrostomy tubes for hydronephrosis. He presents altered but in stable condition with no immediate needs for hemodialysis. Acidosis has improved with bicarb drip. Nephrology has already been consulted.  Assessment & Plan:   Principal Problem:   Acute renal failure superimposed on stage 4 chronic kidney disease (HCC) Active Problems:   BPH (benign prostatic hyperplasia)   Postlaminectomy syndrome, not elsewhere classified   PTSD (post-traumatic stress disorder)   AKI (acute kidney injury) ([place])   Anxiety and depression   Anemia in chronic kidney disease   Acute metabolic encephalopathy   Acute renal failure (ARF) (HCC)   Acute on chronic renal failure - CKD-V  Progressing to ESRD transitioning to dialysis - Related to resumed worsening prostatic/obstruction given outpatient plan TURP and missed appointment - Status post bilateral nephrostomy tubes - previously requiring CRRT (off as of 10/09/20).  - Nephrology transitioning patient to intermittent hemodialysis with plans for catheter placement today, additional dialysis schedule per themselves.  Will need clipping prior to  discharge - Vascular following - planned for left arm AV fistula placement on Monday  Chronic anemia, likely of chronic disease and malnutrition - Received 2 units pRBC 7/11 -Hemoglobin stabilizing, continue to follow and transfuse as indicated  Failure to thrive, severe protein calorie malnutrition - Continue to encourage PO intake - liberalize diet and increase PT/OT  Hypotension:  - Related to chronic wasting and acute acidemia, improved, off pressors. Now on midodrine.  Recurrent diverticulitis with abscess formation leading to complex urological obstruction- s/p diverting ostomy with [facility] resection of redundant sigmoid colon.  Unclear if he will ever be candidate for re-anastamosis to rectal stump.  DVT prophylaxis: holding in the setting of anemia Code Status: DNR Family Communication: None present  Status is: Inpt  Dispo: The patient is from: Home              Anticipated d/c is to: TBD - requesting DC home when possible              Anticipated d/c date is: >57h              Patient currently NOT medically stable for discharge -we will need to transition to intermittent hemodialysis, will require dialysis catheter placement as well as clipping for safe disposition.  Consultants:  Nephrology, Urology  Antimicrobials:  None   Subjective: No acute issues/events overnight -urine output remains markedly low, lengthy discussion today about transitioning to dialysis, patient understands the need for dialysis but is hopeful for renal recovery.  Otherwise denies nausea vomiting diarrhea constipation headache fevers chills chest pain shortness of breath.  Objective: Vitals:   10/13/20 0935 10/13/20 1700 10/13/20 2022 10/14/20 3149  BP: 108/62 111/71 99/63 117/74  Pulse: (!) 57 61 62 62  Resp: 18 17 19 18   Temp: 98.3 F (36.8 C) 98.1 F (36.7 C) 98.4 F (36.9 C) 98.4 F (36.9 C)  TempSrc: Oral Oral Oral Oral  SpO2: 98% 99% 100% 100%  Weight:      Height:         Intake/Output Summary (Last 24 hours) at 10/14/2020 0753 Last data filed at 10/14/2020 0430 Gross per 24 hour  Intake 1290 ml  Output 1525 ml  Net -235 ml    Filed Weights   10/11/20 0500 10/11/20 1200 10/11/20 1505  Weight: 49.3 kg 52 kg 51 kg    Examination:  General:  Pleasantly resting in bed, No acute distress. HEENT:  Normocephalic atraumatic.  Sclerae nonicteric, noninjected.  Extraocular movements intact bilaterally. Neck:  Without mass or deformity.  Trachea is midline. Lungs:  Clear to auscultate bilaterally without rhonchi, wheeze, or rales. Heart:  Regular rate and rhythm.  Without murmurs, rubs, or gallops. Abdomen:  Soft, nontender, nondistended.  Without guarding or rebound. Nephrostomy tube -minimal dark yellow urine output; ostomy in place Extremities: Without cyanosis, clubbing, edema, or obvious deformity. Vascular: Dorsalis pedis and posterior tibial pulses palpable bilaterally. Skin: Warm and dry, no erythema, no ulcerations.   Data Reviewed: I have personally reviewed following labs and imaging studies  CBC: Recent Labs  Lab 10/09/20 0302 10/09/20 1355 10/10/20 0330 10/11/20 1100 10/12/20 0708 10/14/20 0605  WBC 7.5  --  8.5 9.7 7.2 6.1  HGB 6.7* 6.6* 7.6* 7.3* 7.7* 7.2*  HCT 19.5* 20.0* 22.3* 22.3* 23.4* 22.5*  MCV 92.0  --  91.8 95.3 94.7 99.6  PLT 137*  --  106* 155 159 003    Basic Metabolic Panel: Recent Labs  Lab 10/10/20 0309 10/10/20 1801 10/11/20 0319 10/11/20 1555 10/12/20 0708 10/12/20 1625 10/13/20 0458 10/13/20 1901 10/14/20 0605  NA 135   < > 135   < > 135 136 134* 135 134*  K 3.7   < > 3.7   < > 3.7 3.7 3.6 4.8 4.0  CL 103   < > 106   < > 103 106 103 107 106  CO2 25   < > 22   < > 25 22 22 22  20*  GLUCOSE 108*   < > 99   < > 102* 208* 124* 109* 96  BUN 20   < > 28*   < > 17 22* 25* 36* 38*  CREATININE 3.07*   < > 4.05*   < > 3.11* 3.79* 4.14* 4.96* 5.14*  CALCIUM 6.9*   < > 6.5*   < > 6.6* 6.4* 6.4* 6.3* 6.6*  MG  2.2  --  2.4  --  2.0  --  2.1  --  2.2  PHOS 4.3   < > 5.6*   < > 3.4 3.9 4.2 4.5 4.9*   < > = values in this interval not displayed.    GFR: Estimated Creatinine Clearance: 11.6 mL/min (A) (by C-G formula based on SCr of 5.14 mg/dL (H)). Liver Function Tests: Recent Labs  Lab 10/12/20 0708 10/12/20 1625 10/13/20 0458 10/13/20 1901 10/14/20 0605  ALBUMIN 1.9* 1.9* 1.8* 1.9* 1.8*    No results for input(s): LIPASE, AMYLASE in the last 168 hours. No results for input(s): AMMONIA in the last 168 hours. Coagulation Profile: Recent Labs  Lab 10/08/20 0811  INR 1.1    Cardiac Enzymes: No results for input(s): CKTOTAL, CKMB, CKMBINDEX,  TROPONINI in the last 168 hours. BNP (last 3 results) No results for input(s): PROBNP in the last 8760 hours. HbA1C: No results for input(s): HGBA1C in the last 72 hours. CBG: Recent Labs  Lab 10/09/20 1957  GLUCAP 121*    Lipid Profile: No results for input(s): CHOL, HDL, LDLCALC, TRIG, CHOLHDL, LDLDIRECT in the last 72 hours. Thyroid Function Tests: No results for input(s): TSH, T4TOTAL, FREET4, T3FREE, THYROIDAB in the last 72 hours. Anemia Panel: No results for input(s): VITAMINB12, FOLATE, FERRITIN, TIBC, IRON, RETICCTPCT in the last 72 hours. Sepsis Labs: Recent Labs  Lab 10/08/20 0309  PROCALCITON 0.13     Recent Results (from the past 240 hour(s))  Culture, blood (routine x 2)     Status: None   Collection Time: 10/06/20  5:47 PM   Specimen: BLOOD RIGHT FOREARM  Result Value Ref Range Status   Specimen Description BLOOD RIGHT FOREARM  Final   Special Requests   Final    Blood Culture results may not be optimal due to an inadequate volume of blood received in culture bottles BOTTLES DRAWN AEROBIC AND ANAEROBIC   Culture   Final    NO GROWTH 5 DAYS Performed at [facility], [address], [place], [place] [address]    Report Status 10/11/2020 FINAL  Final  Culture, blood (routine x 2)     Status: None   Collection  Time: 10/06/20  6:06 PM   Specimen: BLOOD RIGHT ARM  Result Value Ref Range Status   Specimen Description BLOOD RIGHT ARM  Final   Special Requests   Final    BOTTLES DRAWN AEROBIC AND ANAEROBIC Blood Culture adequate volume   Culture   Final    NO GROWTH 5 DAYS Performed at [facility], [address] [place], [place] [address]    Report Status 10/11/2020 FINAL  Final  Urine culture     Status: Abnormal   Collection Time: 10/06/20  7:31 PM   Specimen: Urine, Clean Catch  Result Value Ref Range Status   Specimen Description   Final    URINE, CLEAN CATCH Performed at [facility], [address], [place], [place] [address]    Special Requests   Final    NONE Performed at [facility], [address], [place], [place] [address]    [place], SUGGEST RECOLLECTION (A)  Final   Report Status 10/08/2020 FINAL  Final  Resp Panel by RT-PCR (Flu A&B, Covid) Nasopharyngeal Swab     Status: None   Collection Time: 10/06/20  8:10 PM   Specimen: Nasopharyngeal Swab; Nasopharyngeal(NP) swabs in vial transport medium  Result Value Ref Range Status   SARS Coronavirus 2 by RT PCR NEGATIVE NEGATIVE Final    Comment: (NOTE) SARS-CoV-2 target nucleic acids are NOT DETECTED.  The SARS-CoV-2 RNA is generally detectable in upper respiratory specimens during the acute phase of infection. The lowest concentration of SARS-CoV-2 viral copies this assay can detect is 138 copies/mL. A negative result does not preclude SARS-Cov-2 infection and should not be used as the sole basis for treatment or other patient management decisions. A negative result may occur with  improper specimen collection/handling, submission of specimen other than nasopharyngeal swab, presence of viral mutation(s) within the areas targeted by this assay, and inadequate number of viral copies(<138 copies/mL). A negative result must be combined with clinical observations, patient history, and  epidemiological information. The expected result is Negative.  Fact Sheet for Patients:  EntrepreneurPulse.com.au  Fact Sheet for Healthcare  Providers:  IncredibleEmployment.be  This test is no t yet approved or cleared by the [place] and  has been authorized for detection and/or diagnosis of SARS-CoV-2 by FDA under an Emergency Use Authorization (EUA). This EUA will remain  in effect (meaning this test can be used) for the duration of the COVID-19 declaration under Section 564(b)(1) of the Act, 21 U.S.C.section 360bbb-3(b)(1), unless the authorization is terminated  or revoked sooner.       Influenza A by PCR NEGATIVE NEGATIVE Final   Influenza B by PCR NEGATIVE NEGATIVE Final    Comment: (NOTE) The Xpert Xpress SARS-CoV-2/FLU/RSV plus assay is intended as an aid in the diagnosis of influenza from Nasopharyngeal swab specimens and should not be used as a sole basis for treatment. Nasal washings and aspirates are unacceptable for Xpert Xpress SARS-CoV-2/FLU/RSV testing.  Fact Sheet for Patients: EntrepreneurPulse.com.au  Fact Sheet for Healthcare Providers: IncredibleEmployment.be  This test is not yet approved or cleared by the [place] FDA and has been authorized for detection and/or diagnosis of SARS-CoV-2 by FDA under an Emergency Use Authorization (EUA). This EUA will remain in effect (meaning this test can be used) for the duration of the COVID-19 declaration under Section 564(b)(1) of the Act, 21 U.S.C. section 360bbb-3(b)(1), unless the authorization is terminated or revoked.  Performed at [facility], [address], [place], [place] [address]   MRSA Next Gen by PCR, Nasal     Status: None   Collection Time: 10/07/20  1:37 PM   Specimen: Nasal Mucosa; Nasal Swab  Result Value Ref Range Status   MRSA by PCR Next Gen NOT DETECTED NOT DETECTED Final    Comment: (NOTE) The  GeneXpert MRSA Assay (FDA approved for NASAL specimens only), is one component of a comprehensive MRSA colonization surveillance program. It is not intended to diagnose MRSA infection nor to guide or monitor treatment for MRSA infections. Test performance is not FDA approved in patients less than 57 years old. Performed at [facility], [place] [address] [place], [place] [address]   Aerobic/Anaerobic Culture w Gram Stain (surgical/deep wound)     Status: None (Preliminary result)   Collection Time: 10/08/20 12:02 PM   Specimen: Urine, Random  Result Value Ref Range Status   Specimen Description URINE, RANDOM  Final   Special Requests LEFT KIDNEY  Final   Gram Stain   Final    ABUNDANT WBC PRESENT,BOTH PMN AND MONONUCLEAR ABUNDANT GRAM VARIABLE ROD Performed at [facility], [place] [address] [place], [place] [address]    Culture   Final    ABUNDANT ESCHERICHIA COLI ABUNDANT PSEUDOMONAS AERUGINOSA ABUNDANT ENTEROCOCCUS FAECIUM NO ANAEROBES ISOLATED; CULTURE IN PROGRESS FOR 5 DAYS    Report Status PENDING  Incomplete   Organism ID, Bacteria ESCHERICHIA COLI  Final   Organism ID, Bacteria ENTEROCOCCUS FAECIUM  Final   Organism ID, Bacteria PSEUDOMONAS AERUGINOSA  Final      Susceptibility   Escherichia coli - MIC*    AMPICILLIN <=2 SENSITIVE Sensitive     CEFAZOLIN <=4 SENSITIVE Sensitive     CEFEPIME <=0.12 SENSITIVE Sensitive     CEFTRIAXONE <=0.25 SENSITIVE Sensitive     CIPROFLOXACIN <=0.25 SENSITIVE Sensitive     GENTAMICIN <=1 SENSITIVE Sensitive     IMIPENEM <=0.25 SENSITIVE Sensitive     NITROFURANTOIN <=16 SENSITIVE Sensitive     TRIMETH/SULFA <=20 SENSITIVE Sensitive     AMPICILLIN/SULBACTAM <=2 SENSITIVE Sensitive     PIP/TAZO <=4 SENSITIVE Sensitive     *  ABUNDANT ESCHERICHIA COLI   Enterococcus faecium - MIC*    AMPICILLIN >=32 RESISTANT Resistant     NITROFURANTOIN <=16 SENSITIVE Sensitive     VANCOMYCIN <=0.5 SENSITIVE Sensitive     * ABUNDANT  ENTEROCOCCUS FAECIUM   Pseudomonas aeruginosa - MIC*    CEFTAZIDIME 4 SENSITIVE Sensitive     CIPROFLOXACIN <=0.25 SENSITIVE Sensitive     GENTAMICIN <=1 SENSITIVE Sensitive     IMIPENEM 8 INTERMEDIATE Intermediate     PIP/TAZO <=4 SENSITIVE Sensitive     CEFEPIME 2 SENSITIVE Sensitive     * ABUNDANT PSEUDOMONAS AERUGINOSA     Radiology Studies: VAS [place] UPPER EXT VEIN MAPPING (PRE-OP AVF)  Result Date: 10/13/2020 [place] MAPPING Patient Name:  Chase [name]  Date of Exam:   10/13/2020 Medical Rec #: [id number]        Accession #:    [id number] Date of Birth: [birth year]        Patient Gender: M Patient Age:   54Y Exam Location:  [facility] Procedure:      VAS [place] UPPER EXT VEIN MAPPING (PRE-OP AVF) Referring Phys: [id number] [place] --------------------------------------------------------------------------------  Indications: Pre-access. History: ESRD.  Limitations: BMI 18.15 Comparison Study: No prior studies. Performing Technologist: Darlin Coco RDMS,RVT  Examination Guidelines: A complete evaluation includes B-mode imaging, spectral Doppler, color Doppler, and power Doppler as needed of all accessible portions of each vessel. Bilateral testing is considered an integral part of a complete examination. Limited examinations for reoccurring indications may be performed as noted. +-----------------+-------------+----------+--------+ Right Cephalic   Diameter (cm)Depth (cm)Findings +-----------------+-------------+----------+--------+ Shoulder             0.36        0.23            +-----------------+-------------+----------+--------+ Prox upper arm       0.34        0.20            +-----------------+-------------+----------+--------+ Mid upper arm        0.38        0.26            +-----------------+-------------+----------+--------+ Dist upper arm       0.38        0.17            +-----------------+-------------+----------+--------+ Antecubital  fossa    0.48        0.15            +-----------------+-------------+----------+--------+ Prox forearm         0.22        0.21            +-----------------+-------------+----------+--------+ Mid forearm          0.21        0.27            +-----------------+-------------+----------+--------+ Dist forearm         0.19        0.18            +-----------------+-------------+----------+--------+ +-----------------+-------------+----------+--------+ Left Cephalic    Diameter (cm)Depth (cm)Findings +-----------------+-------------+----------+--------+ Shoulder             0.38        0.30            +-----------------+-------------+----------+--------+ Prox upper arm       0.45        0.27            +-----------------+-------------+----------+--------+ Mid upper arm  0.31        0.22            +-----------------+-------------+----------+--------+ Dist upper arm       0.30        0.19            +-----------------+-------------+----------+--------+ Antecubital fossa    0.49        0.13            +-----------------+-------------+----------+--------+ Prox forearm         0.26        0.19            +-----------------+-------------+----------+--------+ Mid forearm          0.17        0.20            +-----------------+-------------+----------+--------+ Dist forearm         0.18        0.15      IV    +-----------------+-------------+----------+--------+ *See table(s) above for measurements and observations.  Diagnosing physician: Monica Martinez MD Electronically signed by Monica Martinez MD on 10/13/2020 at 6:37:09 PM.    Final     Scheduled Meds:  calcium carbonate  2 tablet Oral TID WC   Chlorhexidine Gluconate Cloth  6 each Topical Daily   Chlorhexidine Gluconate Cloth  6 each Topical Q0600   darbepoetin (ARANESP) injection - DIALYSIS  60 mcg Subcutaneous Q7 days   mouth rinse  15 mL Mouth Rinse BID   midodrine  10 mg Oral Q  T,Th,Sa-HD   multivitamin with minerals  1 tablet Oral BID   pantoprazole  40 mg Oral Q1200   sodium chloride flush  5 mL Intracatheter Q8H   Continuous Infusions:  sodium chloride 250 mL (10/09/20 1732)   norepinephrine (LEVOPHED) Adult infusion Stopped (10/08/20 0401)     LOS: 8 days   Time spent: 30 min  Little Ishikawa, DO [facility]  If 7PM-7AM, please contact night-coverage [email or web address]  10/14/2020, 7:53 AM

## 2020-10-14 NOTE — Progress Notes (Signed)
[facility]  Subjective  -no complaints   Objective 99/62 75 98.1 F (36.7 C) (Axillary) 17 96%  Intake/Output Summary (Last 24 hours) at 10/14/2020 1026 Last data filed at 10/14/2020 0430 Gross per 24 hour  Intake 1140 ml  Output 1525 ml  Net -385 ml    Palpable radial brachial pulses bilateral upper extremities  Laboratory Lab Results: Recent Labs    10/12/20 0708 10/14/20 0605  WBC 7.2 6.1  HGB 7.7* 7.2*  HCT 23.4* 22.5*  PLT 159 170   BMET Recent Labs    10/13/20 1901 10/14/20 0605  NA 135 134*  K 4.8 4.0  CL 107 106  CO2 22 20*  GLUCOSE 109* 96  BUN 36* 38*  CREATININE 4.96* 5.14*  CALCIUM 6.3* 6.6*    COAG Lab Results  Component Value Date   INR 1.1 10/08/2020   INR 1.0 04/16/2020   INR 1.5 (H) 03/31/2020   No results found for: PTT  Assessment/Planning:  Vascular surgery was consulted yesterday for permanent dialysis access and TDC.  Discussed with patient plan for left arm AV fistula placement on Monday and appears to have a nice cephalic vein on vein mapping and will also place TDC.  Will need to be n.p.o. tomorrow night - Sunday night.  [name] 10/14/2020 10:26 AM --

## 2020-10-15 DIAGNOSIS — G9341 Metabolic encephalopathy: Secondary | ICD-10-CM | POA: Diagnosis not present

## 2020-10-15 DIAGNOSIS — N179 Acute kidney failure, unspecified: Secondary | ICD-10-CM | POA: Diagnosis not present

## 2020-10-15 DIAGNOSIS — N185 Chronic kidney disease, stage 5: Secondary | ICD-10-CM | POA: Diagnosis not present

## 2020-10-15 DIAGNOSIS — F419 Anxiety disorder, unspecified: Secondary | ICD-10-CM | POA: Diagnosis not present

## 2020-10-15 LAB — RENAL FUNCTION PANEL
Albumin: 1.8 g/dL — ABNORMAL LOW (ref 3.5–5.0)
Albumin: 1.7 g/dL — ABNORMAL LOW (ref 3.5–5.0)
Anion gap: 8 (ref 5–15)
Anion gap: 8 (ref 5–15)
BUN: 41 mg/dL — ABNORMAL HIGH (ref 6–20)
BUN: 51 mg/dL — ABNORMAL HIGH (ref 6–20)
CO2: 20 mmol/L — ABNORMAL LOW (ref 22–32)
CO2: 18 mmol/L — ABNORMAL LOW (ref 22–32)
Calcium: 6.8 mg/dL — ABNORMAL LOW (ref 8.9–10.3)
Calcium: 6.6 mg/dL — ABNORMAL LOW (ref 8.9–10.3)
Chloride: 104 mmol/L (ref 98–111)
Chloride: 110 mmol/L (ref 98–111)
Creatinine, Ser: 5.59 mg/dL — ABNORMAL HIGH (ref 0.61–1.24)
Creatinine, Ser: 6.24 mg/dL — ABNORMAL HIGH (ref 0.61–1.24)
GFR, Estimated: 11 mL/min — ABNORMAL LOW (ref >60)
GFR, Estimated: 10 mL/min — ABNORMAL LOW (ref >60)
Glucose, Bld: 95 mg/dL (ref 70–99)
Glucose, Bld: 170 mg/dL — ABNORMAL HIGH (ref 70–99)
Phosphorus: 4.8 mg/dL — ABNORMAL HIGH (ref 2.5–4.6)
Phosphorus: 4.5 mg/dL (ref 2.5–4.6)
Potassium: 4 mmol/L (ref 3.5–5.1)
Potassium: 4.2 mmol/L (ref 3.5–5.1)
Sodium: 132 mmol/L — ABNORMAL LOW (ref 135–145)
Sodium: 136 mmol/L (ref 135–145)

## 2020-10-15 LAB — MAGNESIUM: Magnesium: 2.2 mg/dL (ref 1.7–2.4)

## 2020-10-15 LAB — SURGICAL PCR SCREEN
MRSA, PCR: NEGATIVE
Staphylococcus aureus: NEGATIVE

## 2020-10-15 LAB — GLUCOSE, CAPILLARY
Glucose-Capillary: 106 mg/dL — ABNORMAL HIGH (ref 70–99)
Glucose-Capillary: 115 mg/dL — ABNORMAL HIGH (ref 70–99)

## 2020-10-15 MED ORDER — CEFAZOLIN SODIUM-DEXTROSE 1-4 GM/50ML-% IV SOLN
1.0000 g | INTRAVENOUS | Status: AC
  Administered 2020-10-16: 1 g via INTRAVENOUS
  Filled 2020-10-15: qty 50

## 2020-10-15 NOTE — Progress Notes (Signed)
PROGRESS NOTE    Chase [name]  HQR:[id number] DOB: 10-29-1965 DOA: 10/06/2020 PCP: [name]   Brief Narrative:  Chase [name] is a 55 y.o. male with a PMH significant for CKD stage V, hx of GIB and ulcers requiring gastric bypass, prior stroke, hodgkin's lymphoma, prior MI, cervical radiculopathy, chronic pain syndrome, COVID, and PTSD who presented to the ED via EMS after being found minimally responsive on welfare check. On EMS arrival patient was found in recliner alert but confused. On chart review it appears patient was scheduled for pre op lab work 7/6 and TURP 7/7 but failed to show for either appointment. Patient has an extensive past medical history to include CKD stage 5 that progressed to acute renal failure march of this year and required CRRT. He also has indwelling catheter and bilateral nephrostomy tubes for hydronephrosis. He presents altered but in stable condition with no immediate needs for hemodialysis. Acidosis has improved with bicarb drip. Nephrology has already been consulted.  Assessment & Plan:   Principal Problem:   Acute renal failure superimposed on stage 4 chronic kidney disease (HCC) Active Problems:   BPH (benign prostatic hyperplasia)   Postlaminectomy syndrome, not elsewhere classified   PTSD (post-traumatic stress disorder)   AKI (acute kidney injury) ([place])   Anxiety and depression   Anemia in chronic kidney disease   Acute metabolic encephalopathy   Acute renal failure (ARF) (HCC)   Acute on chronic renal failure - CKD-V  Progressing to ESRD transitioning to dialysis - Related to resumed worsening prostatic/obstruction given outpatient plan TURP and missed appointment - Status post bilateral nephrostomy tubes - previously requiring CRRT (off as of 10/09/20).  - Nephrology transitioning patient to intermittent hemodialysis with plans for [facility] and and fistula formation - Vascular following - planned for left arm AV fistula placement on  Monday  Chronic anemia, likely of chronic disease and malnutrition - Received 2 units pRBC 7/11 -Hemoglobin stabilizing, continue to follow and transfuse as indicated  Failure to thrive, severe protein calorie malnutrition - Continue to encourage PO intake - liberalize diet and increase PT/OT  Hypotension:  - Related to chronic wasting and acute acidemia, improved, off pressors. Continue midodrine.  Recurrent diverticulitis with abscess formation leading to complex urological obstruction- s/p diverting ostomy with [facility] resection of redundant sigmoid colon.  Unclear if he will ever be candidate for re-anastamosis to rectal stump.  DVT prophylaxis: holding in the setting of anemia Code Status: DNR Family Communication: None present  Status is: Inpt  Dispo: The patient is from: Home              Anticipated d/c is to: TBD - requesting DC home when possible              Anticipated d/c date is: >72h              Patient currently NOT medically stable for discharge -we will need to transition to intermittent hemodialysis, will require dialysis catheter placement as well as clipping for safe disposition.  Consultants:  Nephrology, Urology  Antimicrobials:  None   Subjective: No acute issues/events overnight - patient remains somewhat depressed about the need to transition to [facility] but is not ready to transition to palliative care.  Otherwise denies nausea vomiting diarrhea constipation headache fevers chills chest pain shortness of breath.  Objective: Vitals:   10/14/20 0553 10/14/20 1009 10/14/20 2051 10/15/20 0457  BP: 117/74 99/62 98/61  102/66  Pulse: 62 75 63 64  Resp: 18  17 18 18   Temp: 98.4 F (36.9 C) 98.1 F (36.7 C) 98.5 F (36.9 C) 98.2 F (36.8 C)  TempSrc: Oral Axillary Oral   SpO2: 100% 96% 99% 97%  Weight:   51 kg   Height:        Intake/Output Summary (Last 24 hours) at 10/15/2020 0742 Last data filed at 10/15/2020 0600 Gross per 24 hour  Intake 1010 ml   Output 2075 ml  Net -1065 ml    Filed Weights   10/11/20 1200 10/11/20 1505 10/14/20 2051  Weight: 52 kg 51 kg 51 kg    Examination:  General:  Pleasantly resting in bed, No acute distress. HEENT:  Normocephalic atraumatic.  Sclerae nonicteric, noninjected.  Extraocular movements intact bilaterally. Neck:  Without mass or deformity.  Trachea is midline. Lungs:  Clear to auscultate bilaterally without rhonchi, wheeze, or rales. Heart:  Regular rate and rhythm.  Without murmurs, rubs, or gallops. Abdomen:  Soft, nontender, nondistended.  Without guarding or rebound. Nephrostomy tubes -minimal yellow urine output; ostomy in place; Foley draining clear yellow urine. Extremities: Without cyanosis, clubbing, edema, or obvious deformity. Vascular: Dorsalis pedis and posterior tibial pulses palpable bilaterally. Skin: Warm and dry, no erythema   Data Reviewed: I have personally reviewed following labs and imaging studies  CBC: Recent Labs  Lab 10/09/20 0302 10/09/20 1355 10/10/20 0330 10/11/20 1100 10/12/20 0708 10/14/20 0605  WBC 7.5  --  8.5 9.7 7.2 6.1  HGB 6.7* 6.6* 7.6* 7.3* 7.7* 7.2*  HCT 19.5* 20.0* 22.3* 22.3* 23.4* 22.5*  MCV 92.0  --  91.8 95.3 94.7 99.6  PLT 137*  --  106* 155 159 774    Basic Metabolic Panel: Recent Labs  Lab 10/11/20 0319 10/11/20 1555 10/12/20 0708 10/12/20 1625 10/13/20 0458 10/13/20 1901 10/14/20 0605 10/14/20 1620 10/15/20 0425  NA 135   < > 135   < > 134* 135 134* 136 132*  K 3.7   < > 3.7   < > 3.6 4.8 4.0 4.2 4.0  CL 106   < > 103   < > 103 107 106 108 104  CO2 22   < > 25   < > 22 22 20* 21* 20*  GLUCOSE 99   < > 102*   < > 124* 109* 96 89 95  BUN 28*   < > 17   < > 25* 36* 38* 42* 41*  CREATININE 4.05*   < > 3.11*   < > 4.14* 4.96* 5.14* 5.56* 5.59*  CALCIUM 6.5*   < > 6.6*   < > 6.4* 6.3* 6.6* 6.4* 6.8*  MG 2.4  --  2.0  --  2.1  --  2.2  --  2.2  PHOS 5.6*   < > 3.4   < > 4.2 4.5 4.9* 4.8* 4.8*   < > = values in this  interval not displayed.    GFR: Estimated Creatinine Clearance: 10.6 mL/min (A) (by C-G formula based on SCr of 5.59 mg/dL (H)). Liver Function Tests: Recent Labs  Lab 10/13/20 0458 10/13/20 1901 10/14/20 0605 10/14/20 1620 10/15/20 0425  ALBUMIN 1.8* 1.9* 1.8* 1.8* 1.8*    No results for input(s): LIPASE, AMYLASE in the last 168 hours. No results for input(s): AMMONIA in the last 168 hours. Coagulation Profile: Recent Labs  Lab 10/08/20 0811  INR 1.1    Cardiac Enzymes: No results for input(s): CKTOTAL, CKMB, CKMBINDEX, TROPONINI in the last 168 hours. BNP (last 3 results) No results  for input(s): PROBNP in the last 8760 hours. HbA1C: No results for input(s): HGBA1C in the last 72 hours. CBG: Recent Labs  Lab 10/09/20 1957 10/14/20 1648  GLUCAP 121* 116*    Lipid Profile: No results for input(s): CHOL, HDL, LDLCALC, TRIG, CHOLHDL, LDLDIRECT in the last 72 hours. Thyroid Function Tests: No results for input(s): TSH, T4TOTAL, FREET4, T3FREE, THYROIDAB in the last 72 hours. Anemia Panel: No results for input(s): VITAMINB12, FOLATE, FERRITIN, TIBC, IRON, RETICCTPCT in the last 72 hours. Sepsis Labs: No results for input(s): PROCALCITON, LATICACIDVEN in the last 168 hours.   Recent Results (from the past 240 hour(s))  Culture, blood (routine x 2)     Status: None   Collection Time: 10/06/20  5:47 PM   Specimen: BLOOD RIGHT FOREARM  Result Value Ref Range Status   Specimen Description BLOOD RIGHT FOREARM  Final   Special Requests   Final    Blood Culture results may not be optimal due to an inadequate volume of blood received in culture bottles BOTTLES DRAWN AEROBIC AND ANAEROBIC   Culture   Final    NO GROWTH 5 DAYS Performed at [facility], [address], [place], [place] [address]    Report Status 10/11/2020 FINAL  Final  Culture, blood (routine x 2)     Status: None   Collection Time: 10/06/20  6:06 PM   Specimen: BLOOD RIGHT ARM  Result Value Ref  Range Status   Specimen Description BLOOD RIGHT ARM  Final   Special Requests   Final    BOTTLES DRAWN AEROBIC AND ANAEROBIC Blood Culture adequate volume   Culture   Final    NO GROWTH 5 DAYS Performed at [facility], [address], [place], [place] [address]    Report Status 10/11/2020 FINAL  Final  Urine culture     Status: Abnormal   Collection Time: 10/06/20  7:31 PM   Specimen: Urine, Clean Catch  Result Value Ref Range Status   Specimen Description   Final    URINE, CLEAN CATCH Performed at [facility], [address] [place], [place] [address]    Special Requests   Final    NONE Performed at [facility], [address], [place],  [address]    [place], SUGGEST RECOLLECTION (A)  Final   Report Status 10/08/2020 FINAL  Final  Resp Panel by RT-PCR (Flu A&B, Covid) Nasopharyngeal Swab     Status: None   Collection Time: 10/06/20  8:10 PM   Specimen: Nasopharyngeal Swab; Nasopharyngeal(NP) swabs in vial transport medium  Result Value Ref Range Status   SARS Coronavirus 2 by RT PCR NEGATIVE NEGATIVE Final    Comment: (NOTE) SARS-CoV-2 target nucleic acids are NOT DETECTED.  The SARS-CoV-2 RNA is generally detectable in upper respiratory specimens during the acute phase of infection. The lowest concentration of SARS-CoV-2 viral copies this assay can detect is 138 copies/mL. A negative result does not preclude SARS-Cov-2 infection and should not be used as the sole basis for treatment or other patient management decisions. A negative result may occur with  improper specimen collection/handling, submission of specimen other than nasopharyngeal swab, presence of viral mutation(s) within the areas targeted by this assay, and inadequate number of viral copies(<138 copies/mL). A negative result must be combined with clinical observations, patient history, and epidemiological information. The expected result is Negative.  Fact Sheet for Patients:   EntrepreneurPulse.com.au  Fact Sheet for Healthcare Providers:  IncredibleEmployment.be  This test is no t  yet approved or cleared by the [place] and  has been authorized for detection and/or diagnosis of SARS-CoV-2 by FDA under an Emergency Use Authorization (EUA). This EUA will remain  in effect (meaning this test can be used) for the duration of the COVID-19 declaration under Section 564(b)(1) of the Act, 21 U.S.C.section 360bbb-3(b)(1), unless the authorization is terminated  or revoked sooner.       Influenza A by PCR NEGATIVE NEGATIVE Final   Influenza B by PCR NEGATIVE NEGATIVE Final    Comment: (NOTE) The Xpert Xpress SARS-CoV-2/FLU/RSV plus assay is intended as an aid in the diagnosis of influenza from Nasopharyngeal swab specimens and should not be used as a sole basis for treatment. Nasal washings and aspirates are unacceptable for Xpert Xpress SARS-CoV-2/FLU/RSV testing.  Fact Sheet for Patients: EntrepreneurPulse.com.au  Fact Sheet for Healthcare Providers: IncredibleEmployment.be  This test is not yet approved or cleared by the [place] FDA and has been authorized for detection and/or diagnosis of SARS-CoV-2 by FDA under an Emergency Use Authorization (EUA). This EUA will remain in effect (meaning this test can be used) for the duration of the COVID-19 declaration under Section 564(b)(1) of the Act, 21 U.S.C. section 360bbb-3(b)(1), unless the authorization is terminated or revoked.  Performed at [facility], [address] [place], [place] [address]   MRSA Next Gen by PCR, Nasal     Status: None   Collection Time: 10/07/20  1:37 PM   Specimen: Nasal Mucosa; Nasal Swab  Result Value Ref Range Status   MRSA by PCR Next Gen NOT DETECTED NOT DETECTED Final    Comment: (NOTE) The GeneXpert MRSA Assay (FDA approved for NASAL specimens only), is one component of a  comprehensive MRSA colonization surveillance program. It is not intended to diagnose MRSA infection nor to guide or monitor treatment for MRSA infections. Test performance is not FDA approved in patients less than 49 years old. Performed at [facility], [place] [address] [place], [place] [address]   Aerobic/Anaerobic Culture w Gram Stain (surgical/deep wound)     Status: None   Collection Time: 10/08/20 12:02 PM   Specimen: Urine, Random  Result Value Ref Range Status   Specimen Description URINE, RANDOM  Final   Special Requests LEFT KIDNEY  Final   Gram Stain   Final    ABUNDANT WBC PRESENT,BOTH PMN AND MONONUCLEAR ABUNDANT GRAM VARIABLE ROD    Culture   Final    ABUNDANT ESCHERICHIA COLI ABUNDANT PSEUDOMONAS AERUGINOSA ABUNDANT ENTEROCOCCUS FAECIUM NO ANAEROBES ISOLATED Performed at [facility], [place] [address] [place], [place] [address]    Report Status 10/14/2020 FINAL  Final   Organism ID, Bacteria ESCHERICHIA COLI  Final   Organism ID, Bacteria ENTEROCOCCUS FAECIUM  Final   Organism ID, Bacteria PSEUDOMONAS AERUGINOSA  Final      Susceptibility   Escherichia coli - MIC*    AMPICILLIN <=2 SENSITIVE Sensitive     CEFAZOLIN <=4 SENSITIVE Sensitive     CEFEPIME <=0.12 SENSITIVE Sensitive     CEFTRIAXONE <=0.25 SENSITIVE Sensitive     CIPROFLOXACIN <=0.25 SENSITIVE Sensitive     GENTAMICIN <=1 SENSITIVE Sensitive     IMIPENEM <=0.25 SENSITIVE Sensitive     NITROFURANTOIN <=16 SENSITIVE Sensitive     TRIMETH/SULFA <=20 SENSITIVE Sensitive     AMPICILLIN/SULBACTAM <=2 SENSITIVE Sensitive     PIP/TAZO <=4 SENSITIVE Sensitive     * ABUNDANT ESCHERICHIA COLI   Enterococcus faecium - MIC*  AMPICILLIN >=32 RESISTANT Resistant     NITROFURANTOIN <=16 SENSITIVE Sensitive     VANCOMYCIN <=0.5 SENSITIVE Sensitive     * ABUNDANT ENTEROCOCCUS FAECIUM   Pseudomonas aeruginosa - MIC*    CEFTAZIDIME 4 SENSITIVE Sensitive     CIPROFLOXACIN <=0.25 SENSITIVE Sensitive      GENTAMICIN <=1 SENSITIVE Sensitive     IMIPENEM 8 INTERMEDIATE Intermediate     PIP/TAZO <=4 SENSITIVE Sensitive     CEFEPIME 2 SENSITIVE Sensitive     * ABUNDANT PSEUDOMONAS AERUGINOSA     Radiology Studies: VAS [place] UPPER EXT VEIN MAPPING (PRE-OP AVF)  Result Date: 10/13/2020 [place] MAPPING Patient Name:  Chase [name]  Date of Exam:   10/13/2020 Medical Rec #: [id number]        Accession #:    [id number] Date of Birth: 11-03-1965        Patient Gender: M Patient Age:   54Y Exam Location:  [facility] Procedure:      VAS [place] UPPER EXT VEIN MAPPING (PRE-OP AVF) Referring Phys: [id number] [place] --------------------------------------------------------------------------------  Indications: Pre-access. History: ESRD.  Limitations: BMI 18.15 Comparison Study: No prior studies. Performing Technologist: Darlin Coco RDMS,RVT  Examination Guidelines: A complete evaluation includes B-mode imaging, spectral Doppler, color Doppler, and power Doppler as needed of all accessible portions of each vessel. Bilateral testing is considered an integral part of a complete examination. Limited examinations for reoccurring indications may be performed as noted. +-----------------+-------------+----------+--------+ Right Cephalic   Diameter (cm)Depth (cm)Findings +-----------------+-------------+----------+--------+ Shoulder             0.36        0.23            +-----------------+-------------+----------+--------+ Prox upper arm       0.34        0.20            +-----------------+-------------+----------+--------+ Mid upper arm        0.38        0.26            +-----------------+-------------+----------+--------+ Dist upper arm       0.38        0.17            +-----------------+-------------+----------+--------+ Antecubital fossa    0.48        0.15            +-----------------+-------------+----------+--------+ Prox forearm         0.22        0.21             +-----------------+-------------+----------+--------+ Mid forearm          0.21        0.27            +-----------------+-------------+----------+--------+ Dist forearm         0.19        0.18            +-----------------+-------------+----------+--------+ +-----------------+-------------+----------+--------+ Left Cephalic    Diameter (cm)Depth (cm)Findings +-----------------+-------------+----------+--------+ Shoulder             0.38        0.30            +-----------------+-------------+----------+--------+ Prox upper arm       0.45        0.27            +-----------------+-------------+----------+--------+ Mid upper arm        0.31  0.22            +-----------------+-------------+----------+--------+ Dist upper arm       0.30        0.19            +-----------------+-------------+----------+--------+ Antecubital fossa    0.49        0.13            +-----------------+-------------+----------+--------+ Prox forearm         0.26        0.19            +-----------------+-------------+----------+--------+ Mid forearm          0.17        0.20            +-----------------+-------------+----------+--------+ Dist forearm         0.18        0.15      IV    +-----------------+-------------+----------+--------+ *See table(s) above for measurements and observations.  Diagnosing physician: Monica Martinez MD Electronically signed by Monica Martinez MD on 10/13/2020 at 6:37:09 PM.    Final     Scheduled Meds:  calcium carbonate  2 tablet Oral TID WC   Chlorhexidine Gluconate Cloth  6 each Topical Daily   Chlorhexidine Gluconate Cloth  6 each Topical Q0600   darbepoetin (ARANESP) injection - DIALYSIS  60 mcg Subcutaneous Q7 days   mouth rinse  15 mL Mouth Rinse BID   midodrine  10 mg Oral Q T,Th,Sa-HD   multivitamin with minerals  1 tablet Oral BID   pantoprazole  40 mg Oral Q1200   sodium chloride flush  5 mL Intracatheter Q8H    Continuous Infusions:  sodium chloride 250 mL (10/09/20 1732)   norepinephrine (LEVOPHED) Adult infusion Stopped (10/08/20 0401)     LOS: 9 days   Time spent: 30 min  Little Ishikawa, DO [facility]  If 7PM-7AM, please contact night-coverage [email or web address]  10/15/2020, 7:42 AM

## 2020-10-15 NOTE — Progress Notes (Signed)
[facility] Progress Note  Name: Chase [name] MRN: [id number] DOB: 07/30/1965  Chief Complaint:  AMS  Subjective:  For OR tomorrow for AVF/TDC.  HD tomorrow after declining Sat treatment saying wanted to 'rest.' Reiterated long term dialysis is 3x/wk and he is agreeable to that schedule at this time.  UOP 1875.  No new complaints otherwise.    ------------- Background on referral: Chase [name] is a 55 y.o. 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  He was supposed to have a TURP late this week and didn't show up for procedure or pre-op labs.  He was then found to be unresponsive on a welfare check.  He has an indwelling foley.  Nephrology consulted.  Recommended transfer to [facility], [facility], [facility] for nephrology and urologic care.  I last saw him in 01/02/20 inpatient and he was lost to follow-up with [facility] after that hospitalization after missing multiple appointments.  He did also not establish care with nephrology at the [facility].  He has had interim hospitalizations including one with CRRT.  At a SNF at one point but most recently at home.  Blood pressure has been low range 90's and HR has been 40's most recently from 50's earlier.  He has been on bicarb gtt.   Dr. Royce Macadamia had an extensive conversation with the patient, his son (present via phone), and his nurse.  We discussed the risks/benefits/indications of dialysis/RRT.  They do wish to pursue dialysis and consent for renal replacement therapy.  The patient wishes it hadn't come to this but is not ready to transition to comfort care.  He wants to live independently but his son discussed that that hadn't been going well.   Intake/Output Summary (Last 24 hours) at 10/15/2020 1146 Last data filed at 10/15/2020 1100 Gross per 24 hour  Intake 1050 ml  Output 2450 ml  Net -1400 ml     Vitals:  Vitals:   10/14/20 1009 10/14/20  2051 10/15/20 0457 10/15/20 1003  BP: 99/62 98/61 102/66 110/64  Pulse: 75 63 64 66  Resp: 17 18 18 18   Temp: 98.1 F (36.7 C) 98.5 F (36.9 C) 98.2 F (36.8 C) 98 F (36.7 C)  TempSrc: Axillary Oral  Oral  SpO2: 96% 99% 97% 98%  Weight:  51 kg    Height:         Physical Exam:  General: thin chronically ill appearing gentleman  HEENT: [place] AT Eyes: EOMI, anicteric Neck: supple trachea midline Heart: RRR on monitor Lungs: normal WOB on RA Extremities: no edema  GU foley in place Access: left IJ nontunneled catheter  Medications reviewed   Labs:  BMP Latest Ref Rng & Units 10/15/2020 10/14/2020 10/14/2020  Glucose 70 - 99 mg/dL 95 89 96  BUN 6 - 20 mg/dL 41(H) 42(H) 38(H)  Creatinine 0.61 - 1.24 mg/dL 5.59(H) 5.56(H) 5.14(H)  Sodium 135 - 145 mmol/L 132(L) 136 134(L)  Potassium 3.5 - 5.1 mmol/L 4.0 4.2 4.0  Chloride 98 - 111 mmol/L 104 108 106  CO2 22 - 32 mmol/L 20(L) 21(L) 20(L)  Calcium 8.9 - 10.3 mg/dL 6.8(L) 6.4(LL) 6.6(L)     Assessment/Plan:   # 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  Presented with severe AKI requiring CRRT 7/9. Had BL hydronephrosis despite foley and ureteral stents so underwent nephrostomy tube placement as  well.  Despite decompression with good UOP 24h CrCl 3 consistent with ESRD.  - Off CRRT since 7/11, had HD for clearance 7/13; declined HD 7/16 despite expressing desire for longterm HD.  Agreeable to HD Tomorrow and ongoing TIW. -- Appreicate [facility]VS consult re: perm access and TDC placement; planned for Mon -Outpt CLIP underway   # Chronic urinary retention: missed scheduled TURP per charting - s/p BL nephrostomy tubes this admission - plan per urology for nephrostomy tubes --> given committed to dialysis now plan will be maintain comfort and avoid infection   # Hx CVA - left hemiparesis - previously at SNF and noted he presented from home most recently   # Anemia -  normocytic.  Has rec'd 2u pRBC.   Started ESA 7/11.   #BMM: checking PTH, corr ca a bit low - supplement, phos ok.    Insists upon return home vs SNF - concern expressed about feasibility of plan but he insists with home health and [facility] transport to dialysis he willl be ok.  Justin Mend, MD 10/15/2020 11:46 AM

## 2020-10-15 NOTE — Plan of Care (Signed)
  Problem: Clinical Measurements: Goal: Diagnostic test results will improve Outcome: Completed/Met Goal: Respiratory complications will improve Outcome: Completed/Met Goal: Cardiovascular complication will be avoided Outcome: Completed/Met   

## 2020-10-15 NOTE — Plan of Care (Signed)
  Problem: Education: Goal: Knowledge of General Education information will improve Description: Including pain rating scale, medication(s)/side effects and non-pharmacologic comfort measures Outcome: Completed/Met   Problem: Health Behavior/Discharge Planning: Goal: Ability to manage health-related needs will improve Outcome: Completed/Met   Problem: Clinical Measurements: Goal: Ability to maintain clinical measurements within normal limits will improve Outcome: Completed/Met Goal: Will remain free from infection Outcome: Completed/Met

## 2020-10-15 NOTE — Progress Notes (Signed)
[facility]  Subjective  -no complaints   Objective 102/66 64 98.2 F (36.8 C) 18 97%  Intake/Output Summary (Last 24 hours) at 10/15/2020 0855 Last data filed at 10/15/2020 0600 Gross per 24 hour  Intake 1010 ml  Output 2075 ml  Net -1065 ml    Palpable radial brachial pulses bilateral upper extremities  Laboratory Lab Results: Recent Labs    10/14/20 0605  WBC 6.1  HGB 7.2*  HCT 22.5*  PLT 170   BMET Recent Labs    10/14/20 1620 10/15/20 0425  NA 136 132*  K 4.2 4.0  CL 108 104  CO2 21* 20*  GLUCOSE 89 95  BUN 42* 41*  CREATININE 5.56* 5.59*  CALCIUM 6.4* 6.8*    COAG Lab Results  Component Value Date   INR 1.1 10/08/2020   INR 1.0 04/16/2020   INR 1.5 (H) 03/31/2020   No results found for: PTT  Assessment/Planning:   Discussed plan for left arm AV fistula placement with [facility] catheter tomorrow in OR.  Appears to have a nice cephalic vein on vein mapping.  NPO after midnight tonight.  [name] 10/15/2020 8:55 AM --

## 2020-10-16 ENCOUNTER — Inpatient Hospital Stay (HOSPITAL_COMMUNITY): Payer: No Typology Code available for payment source

## 2020-10-16 ENCOUNTER — Encounter (HOSPITAL_COMMUNITY)
Admission: EM | Disposition: A | Discharge status: Home / Self Care | Payer: Self-pay | Source: Home / Self Care | Attending: Internal Medicine

## 2020-10-16 ENCOUNTER — Inpatient Hospital Stay (HOSPITAL_COMMUNITY): Payer: No Typology Code available for payment source | Admitting: Anesthesiology

## 2020-10-16 ENCOUNTER — Encounter (HOSPITAL_COMMUNITY): Payer: Self-pay | Admitting: Family Medicine

## 2020-10-16 DIAGNOSIS — F419 Anxiety disorder, unspecified: Secondary | ICD-10-CM | POA: Diagnosis not present

## 2020-10-16 DIAGNOSIS — G9341 Metabolic encephalopathy: Secondary | ICD-10-CM | POA: Diagnosis not present

## 2020-10-16 DIAGNOSIS — N179 Acute kidney failure, unspecified: Secondary | ICD-10-CM

## 2020-10-16 DIAGNOSIS — N139 Obstructive and reflux uropathy, unspecified: Secondary | ICD-10-CM

## 2020-10-16 DIAGNOSIS — N185 Chronic kidney disease, stage 5: Secondary | ICD-10-CM

## 2020-10-16 HISTORY — PX: INSERTION OF DIALYSIS CATHETER: SHX1324

## 2020-10-16 HISTORY — PX: AV FISTULA PLACEMENT: SHX1204

## 2020-10-16 LAB — BASIC METABOLIC PANEL
Anion gap: 8 (ref 5–15)
BUN: 53 mg/dL — ABNORMAL HIGH (ref 6–20)
CO2: 19 mmol/L — ABNORMAL LOW (ref 22–32)
Calcium: 6.9 mg/dL — ABNORMAL LOW (ref 8.9–10.3)
Chloride: 110 mmol/L (ref 98–111)
Creatinine, Ser: 6.39 mg/dL — ABNORMAL HIGH (ref 0.61–1.24)
GFR, Estimated: 10 mL/min — ABNORMAL LOW (ref >60)
Glucose, Bld: 93 mg/dL (ref 70–99)
Potassium: 4.2 mmol/L (ref 3.5–5.1)
Sodium: 137 mmol/L (ref 135–145)

## 2020-10-16 LAB — CBC
HCT: 22 % — ABNORMAL LOW (ref 39.0–52.0)
Hemoglobin: 6.9 g/dL — CL (ref 13.0–17.0)
MCH: 31.4 pg (ref 26.0–34.0)
MCHC: 31.4 g/dL (ref 30.0–36.0)
MCV: 100 fL (ref 80.0–100.0)
Platelets: 196 10*3/uL (ref 150–400)
RBC: 2.2 MIL/uL — ABNORMAL LOW (ref 4.22–5.81)
RDW: 15.3 % (ref 11.5–15.5)
WBC: 5.7 10*3/uL (ref 4.0–10.5)
nRBC: 0 % (ref 0.0–0.2)

## 2020-10-16 LAB — RENAL FUNCTION PANEL
Albumin: 1.7 g/dL — ABNORMAL LOW (ref 3.5–5.0)
Albumin: 1.8 g/dL — ABNORMAL LOW (ref 3.5–5.0)
Anion gap: 7 (ref 5–15)
Anion gap: 9 (ref 5–15)
BUN: 52 mg/dL — ABNORMAL HIGH (ref 6–20)
BUN: 48 mg/dL — ABNORMAL HIGH (ref 6–20)
CO2: 19 mmol/L — ABNORMAL LOW (ref 22–32)
CO2: 17 mmol/L — ABNORMAL LOW (ref 22–32)
Calcium: 7 mg/dL — ABNORMAL LOW (ref 8.9–10.3)
Calcium: 7.1 mg/dL — ABNORMAL LOW (ref 8.9–10.3)
Chloride: 111 mmol/L (ref 98–111)
Chloride: 109 mmol/L (ref 98–111)
Creatinine, Ser: 6.33 mg/dL — ABNORMAL HIGH (ref 0.61–1.24)
Creatinine, Ser: 5.93 mg/dL — ABNORMAL HIGH (ref 0.61–1.24)
GFR, Estimated: 10 mL/min — ABNORMAL LOW (ref >60)
GFR, Estimated: 10 mL/min — ABNORMAL LOW (ref >60)
Glucose, Bld: 92 mg/dL (ref 70–99)
Glucose, Bld: 146 mg/dL — ABNORMAL HIGH (ref 70–99)
Phosphorus: 5.4 mg/dL — ABNORMAL HIGH (ref 2.5–4.6)
Phosphorus: 5.9 mg/dL — ABNORMAL HIGH (ref 2.5–4.6)
Potassium: 4.2 mmol/L (ref 3.5–5.1)
Potassium: 3.9 mmol/L (ref 3.5–5.1)
Sodium: 137 mmol/L (ref 135–145)
Sodium: 135 mmol/L (ref 135–145)

## 2020-10-16 LAB — PTH, INTACT AND CALCIUM
Calcium, Total (PTH): 6.4 mg/dL — CL (ref 8.7–10.2)
PTH: 64 pg/mL (ref 15–65)

## 2020-10-16 LAB — MAGNESIUM: Magnesium: 2.4 mg/dL (ref 1.7–2.4)

## 2020-10-16 SURGERY — ARTERIOVENOUS (AV) FISTULA CREATION
Anesthesia: General | Site: Neck | Laterality: Right

## 2020-10-16 MED ORDER — DEXMEDETOMIDINE (PRECEDEX) IN NS 20 MCG/5ML (4 MCG/ML) IV SYRINGE
PREFILLED_SYRINGE | INTRAVENOUS | Status: AC
  Filled 2020-10-16: qty 5

## 2020-10-16 MED ORDER — FENTANYL CITRATE (PF) 250 MCG/5ML IJ SOLN
INTRAMUSCULAR | Status: DC | PRN
  Administered 2020-10-16 (×4): 25 ug via INTRAVENOUS

## 2020-10-16 MED ORDER — 0.9 % SODIUM CHLORIDE (POUR BTL) OPTIME
TOPICAL | Status: DC | PRN
  Administered 2020-10-16: 1000 mL

## 2020-10-16 MED ORDER — ONDANSETRON HCL 4 MG/2ML IJ SOLN
INTRAMUSCULAR | Status: DC | PRN
  Administered 2020-10-16: 4 mg via INTRAVENOUS

## 2020-10-16 MED ORDER — MIDAZOLAM HCL 2 MG/2ML IJ SOLN
INTRAMUSCULAR | Status: AC
  Filled 2020-10-16: qty 2

## 2020-10-16 MED ORDER — ORAL CARE MOUTH RINSE: 15.0000 mL | Freq: Once | OROMUCOSAL | Status: AC

## 2020-10-16 MED ORDER — CHLORHEXIDINE GLUCONATE 0.12 % MT SOLN
OROMUCOSAL | Status: AC
  Administered 2020-10-16: 15 mL via OROMUCOSAL
  Filled 2020-10-16: qty 15

## 2020-10-16 MED ORDER — HEPARIN SODIUM (PORCINE) 1000 UNIT/ML IJ SOLN
INTRAMUSCULAR | Status: AC
  Filled 2020-10-16: qty 1

## 2020-10-16 MED ORDER — OXYCODONE HCL 5 MG PO TABS: 5.0000 mg | ORAL_TABLET | Freq: Once | ORAL | Status: DC | PRN

## 2020-10-16 MED ORDER — PHENYLEPHRINE HCL-NACL 10-0.9 MG/250ML-% IV SOLN
INTRAVENOUS | Status: DC | PRN
  Administered 2020-10-16: 20 ug/min via INTRAVENOUS

## 2020-10-16 MED ORDER — EPHEDRINE SULFATE-NACL 50-0.9 MG/10ML-% IV SOSY
PREFILLED_SYRINGE | INTRAVENOUS | Status: DC | PRN
Start: 2020-10-16 — End: 2020-10-16
  Administered 2020-10-16: 5 mg via INTRAVENOUS
  Administered 2020-10-16: 10 mg via INTRAVENOUS
  Administered 2020-10-16: 15 mg via INTRAVENOUS
  Administered 2020-10-16: 5 mg via INTRAVENOUS
  Administered 2020-10-16: 10 mg via INTRAVENOUS

## 2020-10-16 MED ORDER — LIDOCAINE HCL (PF) 1 % IJ SOLN
INTRAMUSCULAR | Status: AC
  Filled 2020-10-16: qty 30

## 2020-10-16 MED ORDER — ATROPINE SULFATE 0.4 MG/ML IJ SOLN
INTRAMUSCULAR | Status: DC | PRN
  Administered 2020-10-16: .4 mg via INTRAVENOUS

## 2020-10-16 MED ORDER — HEPARIN SODIUM (PORCINE) 1000 UNIT/ML IJ SOLN
INTRAMUSCULAR | Status: DC | PRN
  Administered 2020-10-16: 1000 [IU]

## 2020-10-16 MED ORDER — PROPOFOL 10 MG/ML IV BOLUS
INTRAVENOUS | Status: DC | PRN
  Administered 2020-10-16: 90 mg via INTRAVENOUS

## 2020-10-16 MED ORDER — LIDOCAINE 2% (20 MG/ML) 5 ML SYRINGE
INTRAMUSCULAR | Status: DC | PRN
  Administered 2020-10-16: 60 mg via INTRAVENOUS

## 2020-10-16 MED ORDER — HEPARIN SODIUM (PORCINE) 1000 UNIT/ML IJ SOLN
INTRAMUSCULAR | Status: DC | PRN
  Administered 2020-10-16: 3000 [IU] via INTRAVENOUS

## 2020-10-16 MED ORDER — OXYCODONE HCL 5 MG/5ML PO SOLN: 5.0000 mg | Freq: Once | ORAL | Status: DC | PRN

## 2020-10-16 MED ORDER — GLYCOPYRROLATE PF 0.2 MG/ML IJ SOSY
PREFILLED_SYRINGE | INTRAMUSCULAR | Status: DC | PRN
  Administered 2020-10-16: .2 mg via INTRAVENOUS

## 2020-10-16 MED ORDER — HEPARIN 6000 UNIT IRRIGATION SOLUTION
Status: DC | PRN
  Administered 2020-10-16: 1

## 2020-10-16 MED ORDER — CHLORHEXIDINE GLUCONATE 0.12 % MT SOLN: 15.0000 mL | Freq: Once | OROMUCOSAL | Status: AC

## 2020-10-16 MED ORDER — FENTANYL CITRATE (PF) 250 MCG/5ML IJ SOLN
INTRAMUSCULAR | Status: AC
  Filled 2020-10-16: qty 5

## 2020-10-16 MED ORDER — MIDAZOLAM HCL 2 MG/2ML IJ SOLN
INTRAMUSCULAR | Status: DC | PRN
  Administered 2020-10-16: 1 mg via INTRAVENOUS

## 2020-10-16 MED ORDER — DARBEPOETIN ALFA 200 MCG/0.4ML IJ SOSY
200.0000 ug | PREFILLED_SYRINGE | INTRAMUSCULAR | Status: DC
  Filled 2020-10-16: qty 0.4

## 2020-10-16 MED ORDER — FENTANYL CITRATE (PF) 100 MCG/2ML IJ SOLN: 25.0000 ug | INTRAMUSCULAR | Status: DC | PRN

## 2020-10-16 MED ORDER — HEPARIN 6000 UNIT IRRIGATION SOLUTION
Status: AC
  Filled 2020-10-16: qty 500

## 2020-10-16 MED ORDER — LIDOCAINE HCL 1 % IJ SOLN
INTRAMUSCULAR | Status: DC | PRN
  Administered 2020-10-16: 30 mL

## 2020-10-16 MED ORDER — VASOPRESSIN 20 UNIT/ML IV SOLN
INTRAVENOUS | Status: AC
  Filled 2020-10-16: qty 1

## 2020-10-16 MED ORDER — SODIUM CHLORIDE 0.9 % IV SOLN: INTRAVENOUS | Status: DC

## 2020-10-16 MED ORDER — ONDANSETRON HCL 4 MG/2ML IJ SOLN: 4.0000 mg | Freq: Four times a day (QID) | INTRAMUSCULAR | Status: DC | PRN

## 2020-10-16 SURGICAL SUPPLY — 66 items
ADH SKN CLS APL DERMABOND .7 (GAUZE/BANDAGES/DRESSINGS) ×6
AGENT HMST SPONGE THK3/8 (HEMOSTASIS)
ARMBAND PINK RESTRICT EXTREMIT (MISCELLANEOUS) ×6 IMPLANT
BAG BANDED W/RUBBER/TAPE 36X54 (MISCELLANEOUS) ×1 IMPLANT
BAG COUNTER SPONGE SURGICOUNT (BAG) ×3 IMPLANT
BAG DECANTER FOR FLEXI CONT (MISCELLANEOUS) ×2 IMPLANT
BAG EQP BAND 135X91 W/RBR TAPE (MISCELLANEOUS) ×2
BAG SPNG CNTER NS LX DISP (BAG) ×2
BIOPATCH RED 1 DISK 7.0 (GAUZE/BANDAGES/DRESSINGS) ×3 IMPLANT
BLADE CLIPPER SURG (BLADE) ×3 IMPLANT
CANISTER SUCT 3000ML PPV (MISCELLANEOUS) ×3 IMPLANT
CATH PALINDROME-P 19CM W/VT (CATHETERS) ×1 IMPLANT
CATH PALINDROME-P 23CM W/VT (CATHETERS) IMPLANT
CATH PALINDROME-P 28CM W/VT (CATHETERS) IMPLANT
CATH STRAIGHT 5FR 65CM (CATHETERS) IMPLANT
CLIP VESOCCLUDE MED 6/CT (CLIP) ×3 IMPLANT
CLIP VESOCCLUDE SM WIDE 6/CT (CLIP) ×3 IMPLANT
COVER DOME SNAP 22 D (MISCELLANEOUS) ×1 IMPLANT
COVER PROBE W GEL 5X96 (DRAPES) ×3 IMPLANT
COVER SURGICAL LIGHT HANDLE (MISCELLANEOUS) ×3 IMPLANT
DECANTER SPIKE VIAL GLASS SM (MISCELLANEOUS) ×3 IMPLANT
DERMABOND ADVANCED (GAUZE/BANDAGES/DRESSINGS) ×3
DERMABOND ADVANCED .7 DNX12 (GAUZE/BANDAGES/DRESSINGS) ×2 IMPLANT
DRAPE C-ARM 42X72 X-RAY (DRAPES) ×3 IMPLANT
DRAPE CHEST BREAST 15X10 FENES (DRAPES) ×3 IMPLANT
DRSG COVADERM PLUS 2X2 (GAUZE/BANDAGES/DRESSINGS) ×1 IMPLANT
ELECT REM PT RETURN 9FT ADLT (ELECTROSURGICAL) ×3
ELECTRODE REM PT RTRN 9FT ADLT (ELECTROSURGICAL) ×2 IMPLANT
GAUZE 4X4 16PLY ~~LOC~~+RFID DBL (SPONGE) ×3 IMPLANT
GLOVE SRG 8 PF TXTR STRL LF DI (GLOVE) ×2 IMPLANT
GLOVE SURG ENC MOIS LTX SZ7.5 (GLOVE) ×3 IMPLANT
GLOVE SURG UNDER POLY LF SZ8 (GLOVE) ×3
GOWN STRL REUS W/ TWL LRG LVL3 (GOWN DISPOSABLE) ×4 IMPLANT
GOWN STRL REUS W/ TWL XL LVL3 (GOWN DISPOSABLE) ×4 IMPLANT
GOWN STRL REUS W/TWL LRG LVL3 (GOWN DISPOSABLE) ×6
GOWN STRL REUS W/TWL XL LVL3 (GOWN DISPOSABLE) ×6
HEMOSTAT SPONGE AVITENE ULTRA (HEMOSTASIS) IMPLANT
KIT BASIN OR (CUSTOM PROCEDURE TRAY) ×3 IMPLANT
KIT MICROPUNCTURE NIT STIFF (SHEATH) ×1 IMPLANT
KIT PALINDROME-P 55CM (CATHETERS) IMPLANT
KIT TURNOVER KIT B (KITS) ×3 IMPLANT
NDL 18GX1X1/2 (RX/OR ONLY) (NEEDLE) ×2 IMPLANT
NDL HYPO 25GX1X1/2 BEV (NEEDLE) ×2 IMPLANT
NEEDLE 18GX1X1/2 (RX/OR ONLY) (NEEDLE) ×3 IMPLANT
NEEDLE HYPO 25GX1X1/2 BEV (NEEDLE) ×3 IMPLANT
NS IRRIG 1000ML POUR BTL (IV SOLUTION) ×3 IMPLANT
PACK CV ACCESS (CUSTOM PROCEDURE TRAY) ×3 IMPLANT
PACK SURGICAL SETUP 50X90 (CUSTOM PROCEDURE TRAY) ×3 IMPLANT
PAD ARMBOARD 7.5X6 YLW CONV (MISCELLANEOUS) ×6 IMPLANT
SET MICROPUNCTURE 5F STIFF (MISCELLANEOUS) IMPLANT
SOAP 2 % CHG 4 OZ (WOUND CARE) ×3 IMPLANT
SUT ETHILON 3 0 PS 1 (SUTURE) ×4 IMPLANT
SUT MNCRL AB 4-0 PS2 18 (SUTURE) ×4 IMPLANT
SUT PROLENE 6 0 BV (SUTURE) ×3 IMPLANT
SUT PROLENE 7 0 BV 1 (SUTURE) IMPLANT
SUT VIC AB 3-0 SH 27 (SUTURE) ×6
SUT VIC AB 3-0 SH 27X BRD (SUTURE) ×2 IMPLANT
SYR 10ML LL (SYRINGE) ×3 IMPLANT
SYR 20ML LL LF (SYRINGE) ×6 IMPLANT
SYR 5ML LL (SYRINGE) ×3 IMPLANT
SYR CONTROL 10ML LL (SYRINGE) ×3 IMPLANT
TOWEL GREEN STERILE (TOWEL DISPOSABLE) ×3 IMPLANT
TOWEL GREEN STERILE FF (TOWEL DISPOSABLE) ×3 IMPLANT
UNDERPAD 30X36 HEAVY ABSORB (UNDERPADS AND DIAPERS) ×3 IMPLANT
WATER STERILE IRR 1000ML POUR (IV SOLUTION) ×3 IMPLANT
WIRE AMPLATZ SS-J .035X180CM (WIRE) IMPLANT

## 2020-10-16 NOTE — Progress Notes (Signed)
Out Patient Arrangements:  Have been requested to arrange out pt HD for pt. Have submitted medical records to [facility] admissions. Please advise of d/c date.   Greg Harika Laidlaw [facility] [phone]  

## 2020-10-16 NOTE — Discharge Instructions (Addendum)
[facility]  Discharge Instructions  AV Fistula or Graft Surgery for Dialysis Access  Please refer to the following instructions for your post-procedure care. Your surgeon or physician assistant will discuss any changes with you.  Activity  You may drive the day following your surgery, if you are comfortable and no longer taking prescription pain medication. Resume full activity as the soreness in your incision resolves.  Bathing/Showering  You may shower after you go home. Keep your incision dry for 48 hours. Do not soak in a bathtub, hot tub, or swim until the incision heals completely. You may not shower if you have a hemodialysis catheter.  Incision Care  Clean your incision with mild soap and water after 48 hours. Pat the area dry with a clean towel. You do not need a bandage unless otherwise instructed. Do not apply any ointments or creams to your incision. You may have skin glue on your incision. Do not peel it off. It will come off on its own in about one week. Your arm may swell a bit after surgery. To reduce swelling use pillows to elevate your arm so it is above your heart. Your doctor will tell you if you need to lightly wrap your arm with an ACE bandage.  Diet  Resume your normal diet. There are not special food restrictions following this procedure. In order to heal from your surgery, it is CRITICAL to get adequate nutrition. Your body requires vitamins, minerals, and protein. Vegetables are the best source of vitamins and minerals. Vegetables also provide the perfect balance of protein. Processed food has little nutritional value, so try to avoid this.  Medications  Resume taking all of your medications. If your incision is causing pain, you may take over-the counter pain relievers such as acetaminophen (Tylenol). If you were prescribed a stronger pain medication, please be aware these medications can cause nausea and constipation. Prevent  nausea by taking the medication with a snack or meal. Avoid constipation by drinking plenty of fluids and eating foods with high amount of fiber, such as fruits, vegetables, and grains.  Do not take Tylenol if you are taking prescription pain medications.  Follow up Your surgeon may want to see you in the office following your access surgery. If so, this will be arranged at the time of your surgery.  Please call us immediately for any of the following conditions:  Increased pain, redness, drainage (pus) from your incision site Fever of 101 degrees or higher Severe or worsening pain at your incision site Hand pain or numbness.  Reduce your risk of vascular disease:  Stop smoking. If you would like help, call [facility] at 1-800-QUIT-NOW 780-135-1175) or [place] at [place] your cholesterol Maintain a desired weight Control your diabetes Keep your blood pressure down  Dialysis  It will take several weeks to several months for your new dialysis access to be ready for use. Your surgeon will determine when it is okay to use it. Your nephrologist will continue to direct your dialysis. You can continue to use your Permcath until your new access is ready for use.   10/16/2020 Chase [name] [id number] [birth year]  Surgeon(s): [name], MD  Procedure(s): LEFT ARM ARTERIOVENOUS BRACHIOBASILIC FISTULA CREATION INSERTION OF RIGHT INTERNAL JUGULAR TUNNELED DIALYSIS CATHETER WITH ULTRASOUND GUIDANCE  x Do not stick fistula for 12 weeks    If you have any questions, please call the office at [phone].   Vitamin and Mineral  Recommendations  Before Surgery:  Multivitamin Complete  Vitamin D 5000IU After Surgery: Chewable Multivitamins:  Bariatric Advantage Advanced Multi EA Celebrate MultiComplete with 36 or 45 mg Iron  Opurity: Bypass and Sleeve Optimized Chewable  [employer]: Bariatric Multivitamin with 45mg  Iron Chewable       3 Chewable  Calcium (500 mg each, total 1200-1500 mg per day)   *Any Calcium brand will do  Other Options:  4 Celebrate Essential Multi 2 in 1 (has calcium) + 18-60 mg separate iron  BariActiv Multi Vitamin + BariActiv Iron (When ordered it all comes together)   Vitamins and Calcium are available at:   [facility]   [address], [place], [place] [address]                                                [email or web address]                                                              www.[email or web address]  [email or web address]      www.amazon.com  https://www.unjury.com/opurity-bypass-sleeve-optimized-chewable-new-formulation.html                                                                        Why bariatric specific vitamins?  The latest research from the [place] of Metabolic and Bariatric Surgery (ASMBS) recommends that patients take at least 3000 IU of Vitamin D3, 12 mg of thiamin, 18-60 mg of iron, and 350-500 mcg of Vitamin B12 to prevent these common deficiencies after surgery. At this time no other complete multivitamins meet these criteria on their own.  Please talk with your dietitian to learn about other options if these options do not work for you.

## 2020-10-16 NOTE — Progress Notes (Signed)
Physical Therapy Treatment Patient Details Name: Chase [name] MRN: [id number] DOB: Nov 12, 1965 Today's Date: 10/16/2020    History of Present Illness Pt is 55 yo male who presented to the ED via EMS after being found minimally responsive during a welfare check. PMHx significant for CKD stage IV, hx of GIB and ulcers requiring gastric bypass, Hx of CVA, hodgkin's lymphoma, prior MI, cervical radiculopathy, chronic pain syndrome, COVID, and PTSD.    PT Comments    Pt supine in bed on entry with complaints of back pain requesting pain medication, but willing to work with therapy while waiting for RN to come. Pt much more cooperative today, but continues to have poor understanding of situation. Pt is upset that he is NPO for procedure later this morning. Pt limited in safe mobility by pain and generalized weakness. Pt currently minAx2 for bed mobility and able to tolerate sitting EoB for 5 minutes while MD spoke with him. D/c plans remain appropriate at this time. PT will continue to follow acutely.     Follow Up Recommendations  Home health PT;Other (comment);Supervision - Intermittent (pt refuses SNF, encouraging [facility] through [place] however pt wants some days to his self.)     Equipment Recommendations  None recommended by PT (has necessary equipment)       Precautions / Restrictions Precautions Precautions: Fall Restrictions Weight Bearing Restrictions: No    Mobility  Bed Mobility Overal bed mobility: Needs Assistance Bed Mobility: Supine to Sit;Sit to Supine     Supine to sit: Min assist;+2 for physical assistance Sit to supine: Min assist;+2 for physical assistance   General bed mobility comments: with HoB elevated pt able to come to EoB with minAx2 and cuing for hand placement on R bedrail to pull himself around, min Ax2 for managing LE into bed, pt requires total A for pad scoot to HoB    Transfers                 General transfer comment: Pt declined OOB activity  at this time d/t fatigue and desire to rest at this time.      Balance Overall balance assessment: Needs assistance Sitting-balance support: Feet supported;Bilateral upper extremity supported Sitting balance-Leahy Scale: Poor Sitting balance - Comments: requires light outside support, able to sit for 5 minutes before returning to bed                                    Cognition Arousal/Alertness: Awake/alert Behavior During Therapy: Flat affect;WFL for tasks assessed/performed Overall Cognitive Status: Within Functional Limits for tasks assessed                                 General Comments: more cooperative today      Exercises      General Comments General comments (skin integrity, edema, etc.): VSS on RA, pt NPO for procedure and voices displeasure at NPO status, attempted to order lunch and order could not be placed. Also air mattress losing air at the R HoB, baffle noted to be uninflated called [employer] for service call comfirmation # [phone]      Pertinent Vitals/Pain Pain Assessment: Faces Faces Pain Scale: Hurts little more Pain Location: kidneys, back Pain Descriptors / Indicators: Discomfort;Grimacing Pain Intervention(s): Limited activity within patient's tolerance;Monitored during session;Repositioned     PT Goals (current goals can now be  found in the care plan section) Acute Rehab PT Goals Patient Stated Goal: to go home from the hospital PT Goal Formulation: With patient Time For Goal Achievement: 10/26/20 Potential to Achieve Goals: Poor Progress towards PT goals: Progressing toward goals    Frequency    Min 3X/week      PT Plan Current plan remains appropriate       AM-PAC PT "6 Clicks" Mobility   Outcome Measure  Help needed turning from your back to your side while in a flat bed without using bedrails?: Total Help needed moving from lying on your back to sitting on the side of a flat bed without using  bedrails?: Total Help needed moving to and from a bed to a chair (including a wheelchair)?: Total Help needed standing up from a chair using your arms (e.g., wheelchair or bedside chair)?: Total Help needed to walk in hospital room?: Total Help needed climbing 3-5 steps with a railing? : Total 6 Click Score: 6    End of Session   Activity Tolerance: Patient limited by pain;Patient limited by fatigue Patient left: in bed;with call bell/phone within reach;with bed alarm set Nurse Communication: Mobility status;Patient requests pain meds PT Visit Diagnosis: Muscle weakness (generalized) (M62.81);Repeated falls (R29.6);History of falling (Z91.81);Unsteadiness on feet (R26.81);Other abnormalities of gait and mobility (R26.89);Difficulty in walking, not elsewhere classified (R26.2);Hemiplegia and hemiparesis;Pain Hemiplegia - Right/Left: Left Hemiplegia - dominant/non-dominant: Non-dominant Hemiplegia - caused by: Cerebral infarction Pain - Right/Left:  (bilateral) Pain - part of body:  (kidneys and back)     Time: 8676-1950 PT Time Calculation (min) (ACUTE ONLY): 32 min  Charges:  $Therapeutic Activity: 8-22 mins                     Keyosha Tiedt B. Migdalia Dk PT, DPT Acute Rehabilitation Services Pager [phone] Office [phone]    [employer] 10/16/2020, 11:53 AM

## 2020-10-16 NOTE — Progress Notes (Signed)
Chase [name]  GEX:[id number] DOB: Feb 04, 1966 DOA: 10/06/2020 PCP: [name]   Brief Narrative:  Chase [name] is a 55 y.o. male with a PMH significant for CKD stage V, hx of GIB and ulcers requiring gastric bypass, prior stroke, hodgkin's lymphoma, prior MI, cervical radiculopathy, chronic pain syndrome, COVID, and PTSD who presented to the ED via EMS after being found minimally responsive on welfare check. On EMS arrival patient was found in recliner alert but confused. On chart review it appears patient was scheduled for pre op lab work 7/6 and TURP 7/7 but failed to show for either appointment. Patient has an extensive past medical history to include CKD stage 5 that progressed to acute renal failure march of this year and required CRRT. He also has indwelling catheter and bilateral nephrostomy tubes for hydronephrosis. He presents altered but in stable condition with no immediate needs for hemodialysis. Acidosis has improved with bicarb drip. Nephrology has already been consulted.  Assessment & Plan:   Principal Problem:   Acute renal failure superimposed on stage 4 chronic kidney disease (HCC) Active Problems:   BPH (benign prostatic hyperplasia)   Postlaminectomy syndrome, not elsewhere classified   PTSD (post-traumatic stress disorder)   AKI (acute kidney injury) ([place])   Anxiety and depression   Anemia in chronic kidney disease   Acute metabolic encephalopathy   Acute renal failure (ARF) (HCC)  Acute on chronic renal failure - CKD-V  Progressing to ESRD transitioning to dialysis - Related to resumed worsening prostatic/obstruction given outpatient plan TURP and missed appointment - Status post bilateral nephrostomy tubes - previously requiring CRRT (off as of 10/09/20).  - Nephrology transitioning patient to intermittent hemodialysis with plans for [facility] and and fistula formation today - Vascular following - planned for left arm AV fistula placement  later today  Chronic anemia, likely of chronic disease and malnutrition - Received 2 units pRBC 7/11 - Hemoglobin minimally downtrending, 6.9 this morning, nephrology to initiate EPO -defer to their expertise on additional transfusion during dialysis or follow along with a EPO only  Failure to thrive, severe protein calorie malnutrition - Continue to encourage PO intake - liberalize diet and increase PT/OT  Hypotension:  - Related to chronic wasting and acute acidemia, improved, off pressors. Continue midodrine.  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  Unclear if he will ever be candidate for re-anastamosis to rectal stump.  DVT prophylaxis: holding in the setting of anemia Code Status: DNR Family Communication: Patient to update son daily  Status is: Inpt  Dispo: The patient is from: Home              Anticipated d/c is to: TBD - requesting DC home when possible              Anticipated d/c date is: >72h              Patient currently NOT medically stable for discharge -we will need to transition to intermittent hemodialysis, will require dialysis catheter placement as well as clipping for safe disposition.  Consultants:  Nephrology, Urology  Antimicrobials:  None   Subjective: No acute issues or events overnight denies nausea vomiting diarrhea constipation headache fevers chills or chest pain  Objective: Vitals:   10/15/20 1003 10/15/20 1732 10/15/20 2058 10/16/20 0506  BP: 110/64 99/71 103/62 107/69  Pulse: 66 (!) 58 68 (!) 56  Resp: 18  18 18 18   Temp: 98 F (36.7 C) 98 F (36.7 C) 98.4 F (36.9 C) 98.4 F (36.9 C)  TempSrc: Oral     SpO2: 98%  96% 97%  Weight:      Height:        Intake/Output Summary (Last 24 hours) at 10/16/2020 0816 Last data filed at 10/16/2020 0600 Gross per 24 hour  Intake 960 ml  Output 2450 ml  Net -1490 ml     Filed Weights   10/11/20 1200 10/11/20 1505 10/14/20 2051  Weight: 52 kg 51 kg 51 kg    Examination:  General:  Pleasantly resting in bed, No acute distress. HEENT:  Normocephalic atraumatic.  Sclerae nonicteric, noninjected.  Extraocular movements intact bilaterally. Neck:  Without mass or deformity.  Trachea is midline.   Lungs:  Clear to auscultate bilaterally without rhonchi, wheeze, or rales. Heart:  Regular rate and rhythm.  Without murmurs, rubs, or gallops. Abdomen:  Soft, nontender, nondistended.  Without guarding or rebound. Nephrostomy tubes -minimal yellow urine output; ostomy in place; Foley draining clear yellow urine. Extremities: Without cyanosis, clubbing, edema, or obvious deformity. Vascular: Dorsalis pedis and posterior tibial pulses palpable bilaterally. Skin: Warm and dry, no erythema   Data Reviewed: I have personally reviewed following labs and imaging studies  CBC: Recent Labs  Lab 10/10/20 0330 10/11/20 1100 10/12/20 0708 10/14/20 0605 10/16/20 0641  WBC 8.5 9.7 7.2 6.1 5.7  HGB 7.6* 7.3* 7.7* 7.2* 6.9*  HCT 22.3* 22.3* 23.4* 22.5* 22.0*  MCV 91.8 95.3 94.7 99.6 100.0  PLT 106* 155 159 170 563    Basic Metabolic Panel: Recent Labs  Lab 10/12/20 0708 10/12/20 1625 10/13/20 0458 10/13/20 1901 10/14/20 0605 10/14/20 1620 10/15/20 0425 10/15/20 1639 10/16/20 0641  NA 135   < > 134*   < > 134* 136 132* 136 137  137  K 3.7   < > 3.6   < > 4.0 4.2 4.0 4.2 4.2  4.2  CL 103   < > 103   < > 106 108 104 110 110  111  CO2 25   < > 22   < > 20* 21* 20* 18* 19*  19*  GLUCOSE 102*   < > 124*   < > 96 89 95 170* 93  92  BUN 17   < > 25*   < > 38* 42* 41* 51* 53*  52*  CREATININE 3.11*   < > 4.14*   < > 5.14* 5.56* 5.59* 6.24* 6.39*  6.33*  CALCIUM 6.6*   < > 6.4*   < > 6.6* 6.4* 6.8* 6.6* 6.9*  7.0*  MG 2.0  --  2.1  --  2.2  --  2.2  --  2.4  PHOS 3.4   < > 4.2   < > 4.9* 4.8* 4.8* 4.5 5.4*   < > = values in this interval not  displayed.    GFR: Estimated Creatinine Clearance: 9.4 mL/min (A) (by C-G formula based on SCr of 6.33 mg/dL (H)). Liver Function Tests: Recent Labs  Lab 10/14/20 0605 10/14/20 1620 10/15/20 0425 10/15/20 1639 10/16/20 0641  ALBUMIN 1.8* 1.8* 1.8* 1.7* 1.7*    No results for input(s): LIPASE, AMYLASE in the last 168 hours. No results for input(s): AMMONIA in the last 168 hours. Coagulation Profile: No results for input(s): INR, PROTIME in the last 168 hours.  Cardiac Enzymes: No results for input(s): CKTOTAL, CKMB, CKMBINDEX, TROPONINI in the last 168 hours. BNP (last  3 results) No results for input(s): PROBNP in the last 8760 hours. HbA1C: No results for input(s): HGBA1C in the last 72 hours. CBG: Recent Labs  Lab 10/09/20 1957 10/14/20 1648 10/15/20 1139 10/15/20 1731  GLUCAP 121* 116* 106* 115*    Lipid Profile: No results for input(s): CHOL, HDL, LDLCALC, TRIG, CHOLHDL, LDLDIRECT in the last 72 hours. Thyroid Function Tests: No results for input(s): TSH, T4TOTAL, FREET4, T3FREE, THYROIDAB in the last 72 hours. Anemia Panel: No results for input(s): VITAMINB12, FOLATE, FERRITIN, TIBC, IRON, RETICCTPCT in the last 72 hours. Sepsis Labs: No results for input(s): PROCALCITON, LATICACIDVEN in the last 168 hours.   Recent Results (from the past 240 hour(s))  Culture, blood (routine x 2)     Status: None   Collection Time: 10/06/20  5:47 PM   Specimen: BLOOD RIGHT FOREARM  Result Value Ref Range Status   Specimen Description BLOOD RIGHT FOREARM  Final   Special Requests   Final    Blood Culture results may not be optimal due to an inadequate volume of blood received in culture bottles BOTTLES DRAWN AEROBIC AND ANAEROBIC   Culture   Final    NO GROWTH 5 DAYS Performed at [facility], [address], [place], [place] [address]    Report Status 10/11/2020 FINAL  Final  Culture, blood (routine x 2)     Status: None   Collection Time: 10/06/20  6:06 PM    Specimen: BLOOD RIGHT ARM  Result Value Ref Range Status   Specimen Description BLOOD RIGHT ARM  Final   Special Requests   Final    BOTTLES DRAWN AEROBIC AND ANAEROBIC Blood Culture adequate volume   Culture   Final    NO GROWTH 5 DAYS Performed at [facility], [address], [place], [place] [address]    Report Status 10/11/2020 FINAL  Final  Urine culture     Status: Abnormal   Collection Time: 10/06/20  7:31 PM   Specimen: Urine, Clean Catch  Result Value Ref Range Status   Specimen Description   Final    URINE, CLEAN CATCH Performed at [facility], [address], [place], [place] [address]    Special Requests   Final    NONE Performed at [facility], [address], [place], [place] [address]    [place], SUGGEST RECOLLECTION (A)  Final   Report Status 10/08/2020 FINAL  Final  Resp Panel by RT-PCR (Flu A&B, Covid) Nasopharyngeal Swab     Status: None   Collection Time: 10/06/20  8:10 PM   Specimen: Nasopharyngeal Swab; Nasopharyngeal(NP) swabs in vial transport medium  Result Value Ref Range Status   SARS Coronavirus 2 by RT PCR NEGATIVE NEGATIVE Final    Comment: (NOTE) SARS-CoV-2 target nucleic acids are NOT DETECTED.  The SARS-CoV-2 RNA is generally detectable in upper respiratory specimens during the acute phase of infection. The lowest concentration of SARS-CoV-2 viral copies this assay can detect is 138 copies/mL. A negative result does not preclude SARS-Cov-2 infection and should not be used as the sole basis for treatment or other patient management decisions. A negative result may occur with  improper specimen collection/handling, submission of specimen other than nasopharyngeal swab, presence of viral mutation(s) within the areas targeted by this assay, and inadequate number of viral copies(<138 copies/mL). A negative result must be combined with clinical observations, patient history, and epidemiological information. The expected  result is Negative.  Fact Sheet for Patients:  EntrepreneurPulse.com.au  Fact Sheet for  Healthcare Providers:  IncredibleEmployment.be  This test is no t yet approved or cleared by the [place] and  has been authorized for detection and/or diagnosis of SARS-CoV-2 by FDA under an Emergency Use Authorization (EUA). This EUA will remain  in effect (meaning this test can be used) for the duration of the COVID-19 declaration under Section 564(b)(1) of the Act, 21 U.S.C.section 360bbb-3(b)(1), unless the authorization is terminated  or revoked sooner.       Influenza A by PCR NEGATIVE NEGATIVE Final   Influenza B by PCR NEGATIVE NEGATIVE Final    Comment: (NOTE) The Xpert Xpress SARS-CoV-2/FLU/RSV plus assay is intended as an aid in the diagnosis of influenza from Nasopharyngeal swab specimens and should not be used as a sole basis for treatment. Nasal washings and aspirates are unacceptable for Xpert Xpress SARS-CoV-2/FLU/RSV testing.  Fact Sheet for Patients: EntrepreneurPulse.com.au  Fact Sheet for Healthcare Providers: IncredibleEmployment.be  This test is not yet approved or cleared by the [place] FDA and has been authorized for detection and/or diagnosis of SARS-CoV-2 by FDA under an Emergency Use Authorization (EUA). This EUA will remain in effect (meaning this test can be used) for the duration of the COVID-19 declaration under Section 564(b)(1) of the Act, 21 U.S.C. section 360bbb-3(b)(1), unless the authorization is terminated or revoked.  Performed at [facility], [address] [place], [place] [address]   MRSA Next Gen by PCR, Nasal     Status: None   Collection Time: 10/07/20  1:37 PM   Specimen: Nasal Mucosa; Nasal Swab  Result Value Ref Range Status   MRSA by PCR Next Gen NOT DETECTED NOT DETECTED Final    Comment: (NOTE) The GeneXpert MRSA Assay (FDA approved for NASAL  specimens only), is one component of a comprehensive MRSA colonization surveillance program. It is not intended to diagnose MRSA infection nor to guide or monitor treatment for MRSA infections. Test performance is not FDA approved in patients less than 67 years old. Performed at [facility], [place] [address], [place], [place] [address]   Aerobic/Anaerobic Culture w Gram Stain (surgical/deep wound)     Status: None   Collection Time: 10/08/20 12:02 PM   Specimen: Urine, Random  Result Value Ref Range Status   Specimen Description URINE, RANDOM  Final   Special Requests LEFT KIDNEY  Final   Gram Stain   Final    ABUNDANT WBC PRESENT,BOTH PMN AND MONONUCLEAR ABUNDANT GRAM VARIABLE ROD    Culture   Final    ABUNDANT ESCHERICHIA COLI ABUNDANT PSEUDOMONAS AERUGINOSA ABUNDANT ENTEROCOCCUS FAECIUM NO ANAEROBES ISOLATED Performed at [facility], [place] [address] [place], [place] [address]    Report Status 10/14/2020 FINAL  Final   Organism ID, Bacteria ESCHERICHIA COLI  Final   Organism ID, Bacteria ENTEROCOCCUS FAECIUM  Final   Organism ID, Bacteria PSEUDOMONAS AERUGINOSA  Final      Susceptibility   Escherichia coli - MIC*    AMPICILLIN <=2 SENSITIVE Sensitive     CEFAZOLIN <=4 SENSITIVE Sensitive     CEFEPIME <=0.12 SENSITIVE Sensitive     CEFTRIAXONE <=0.25 SENSITIVE Sensitive     CIPROFLOXACIN <=0.25 SENSITIVE Sensitive     GENTAMICIN <=1 SENSITIVE Sensitive     IMIPENEM <=0.25 SENSITIVE Sensitive     NITROFURANTOIN <=16 SENSITIVE Sensitive     TRIMETH/SULFA <=20 SENSITIVE Sensitive     AMPICILLIN/SULBACTAM <=2 SENSITIVE Sensitive     PIP/TAZO <=4 SENSITIVE Sensitive     * ABUNDANT ESCHERICHIA  COLI   Enterococcus faecium - MIC*    AMPICILLIN >=32 RESISTANT Resistant     NITROFURANTOIN <=16 SENSITIVE Sensitive     VANCOMYCIN <=0.5 SENSITIVE Sensitive     * ABUNDANT ENTEROCOCCUS FAECIUM   Pseudomonas aeruginosa - MIC*    CEFTAZIDIME 4 SENSITIVE Sensitive      CIPROFLOXACIN <=0.25 SENSITIVE Sensitive     GENTAMICIN <=1 SENSITIVE Sensitive     IMIPENEM 8 INTERMEDIATE Intermediate     PIP/TAZO <=4 SENSITIVE Sensitive     CEFEPIME 2 SENSITIVE Sensitive     * ABUNDANT PSEUDOMONAS AERUGINOSA  Surgical pcr screen     Status: None   Collection Time: 10/15/20  9:40 PM   Specimen: Nasal Mucosa; Nasal Swab  Result Value Ref Range Status   MRSA, PCR NEGATIVE NEGATIVE Final   Staphylococcus aureus NEGATIVE NEGATIVE Final    Comment: (NOTE) The Xpert SA Assay (FDA approved for NASAL specimens in patients 24 years of age and older), is one component of a comprehensive surveillance program. It is not intended to diagnose infection nor to guide or monitor treatment. Performed at [facility], [place] [address], [place], [place] [address]      Radiology Studies: No results found.  Scheduled Meds:  calcium carbonate  2 tablet Oral TID WC   Chlorhexidine Gluconate Cloth  6 each Topical Daily   Chlorhexidine Gluconate Cloth  6 each Topical Q0600   darbepoetin (ARANESP) injection - DIALYSIS  60 mcg Subcutaneous Q7 days   mouth rinse  15 mL Mouth Rinse BID   midodrine  10 mg Oral Q T,Th,Sa-HD   multivitamin with minerals  1 tablet Oral BID   pantoprazole  40 mg Oral Q1200   sodium chloride flush  5 mL Intracatheter Q8H   Continuous Infusions:  sodium chloride 250 mL (10/09/20 1732)    ceFAZolin (ANCEF) IV     norepinephrine (LEVOPHED) Adult infusion Stopped (10/08/20 0401)     LOS: 10 days   Time spent: 30 min  Little Ishikawa, DO [facility]  If 7PM-7AM, please contact night-coverage [email or web address]  10/16/2020, 8:16 AM

## 2020-10-16 NOTE — Progress Notes (Signed)
Occupational Therapy Treatment Patient Details Name: Chase [name] MRN: [id number] DOB: 16-Apr-1965 Today's Date: 10/16/2020    History of present illness Pt is 55 yo male who presented to the ED via EMS after being found minimally responsive during a welfare check. PMHx significant for CKD stage IV, hx of GIB and ulcers requiring gastric bypass, Hx of CVA, hodgkin's lymphoma, prior MI, cervical radiculopathy, chronic pain syndrome, COVID, and PTSD.   OT comments  Patient met lying supine in bed this date. Increased coaxing/encouragement for participation with PT/OT co-treatment this date. Patient performed 1 set x10 reps each of BUE HEP to improve strength in prep for ADLs and tolerated sitting EOB for 5 minutes with light Min A and BUE supported on bed surface. Patient would benefit from continued acute OT services in prep for safe d/c with recommendation for SNF rehab (although patient continues to refuse). OT will continue to follow acutely.    Follow Up Recommendations  SNF;Supervision/Assistance - 24 hour;Other (comment) (Patient refusing SNF, recommend [facility] services (RN, NT, PT, OT))    Equipment Recommendations  3 in 1 bedside commode    Recommendations for Other Services      Precautions / Restrictions Precautions Precautions: Fall Restrictions Weight Bearing Restrictions: No       Mobility Bed Mobility Overal bed mobility: Needs Assistance Bed Mobility: Supine to Sit;Sit to Supine     Supine to sit: Min assist;+2 for physical assistance Sit to supine: Min assist;+2 for physical assistance   General bed mobility comments: with HoB elevated pt able to come to EoB with minAx2 and cuing for hand placement on R bedrail to pull himself around, min Ax2 for managing LE into bed, pt requires total A for pad scoot to HoB    Transfers                 General transfer comment: Pt declined OOB activity at this time d/t fatigue and desire to rest at this time.     Balance Overall balance assessment: Needs assistance Sitting-balance support: Feet supported;Bilateral upper extremity supported Sitting balance-Leahy Scale: Poor Sitting balance - Comments: requires light outside support, able to sit for 5 minutes before returning to bed                                   ADL either performed or assessed with clinical judgement   ADL Overall ADL's : Needs assistance/impaired                 Upper Body Dressing : Moderate assistance Upper Body Dressing Details (indicate cue type and reason): Donned anterior gown in supine with Mod A. Lower Body Dressing: Maximal assistance Lower Body Dressing Details (indicate cue type and reason): Max A to don footwear in supine.                     Vision       Perception     Praxis      Cognition Arousal/Alertness: Awake/alert Behavior During Therapy: Flat affect;WFL for tasks assessed/performed Overall Cognitive Status: Within Functional Limits for tasks assessed                                 General Comments: More cooperative today        Exercises Exercises: General Upper Extremity General Exercises - Upper  Extremity Shoulder Flexion: Strengthening;Both;10 reps Shoulder ABduction: Strengthening;Both;10 reps Elbow Flexion: Strengthening;Both;10 reps   Shoulder Instructions       General Comments VSS on RA, pt NPO for procedure and voices displeasure at NPO status, attempted to order lunch and order could not be placed. Also air mattress losing air at the R HoB, baffle noted to be uninflated called [employer] for service call comfirmation # [phone]    Pertinent Vitals/ Pain       Pain Assessment: Faces Faces Pain Scale: Hurts little more Pain Location: kidneys, back Pain Descriptors / Indicators: Discomfort;Grimacing Pain Intervention(s): Limited activity within patient's tolerance;Monitored during session;Repositioned;Patient requesting pain meds-RN  notified  Home Living                                          Prior Functioning/Environment              Frequency  Min 2X/week        Progress Toward Goals  OT Goals(current goals can now be found in the care plan section)  Progress towards OT goals: Not progressing toward goals - comment (Patient greatly limited by fatigue and weakness.)  Acute Rehab OT Goals Patient Stated Goal: To return home. OT Goal Formulation: With patient Time For Goal Achievement: 10/25/20 Potential to Achieve Goals: Fair ADL Goals Pt Will Perform Grooming: with set-up;with supervision;sitting Pt Will Perform Upper Body Dressing: with set-up;with supervision;sitting Pt Will Perform Lower Body Dressing: with min assist;sit to/from stand;with min guard assist;sitting/lateral leans Pt Will Transfer to Toilet: with min guard assist;stand pivot transfer;ambulating;bedside commode Pt/caregiver will Perform Home Exercise Program: Increased strength;Both right and left upper extremity;With Supervision;With minimal assist Additional ADL Goal #1: Pt will complete bed mobility at min A level to prepare for EOB/OOB ADLs.  Plan Discharge plan remains appropriate;Frequency remains appropriate    Co-evaluation    PT/OT/SLP Co-Evaluation/Treatment: Yes Reason for Co-Treatment: Complexity of the patient's impairments (multi-system involvement);To address functional/ADL transfers   OT goals addressed during session: ADL's and self-care      AM-PAC OT "6 Clicks" Daily Activity     Outcome Measure   Help from another person eating meals?: A Little Help from another person taking care of personal grooming?: A Little Help from another person toileting, which includes using toliet, bedpan, or urinal?: A Lot Help from another person bathing (including washing, rinsing, drying)?: A Lot Help from another person to put on and taking off regular upper body clothing?: A Lot Help from another  person to put on and taking off regular lower body clothing?: A Lot 6 Click Score: 14    End of Session    OT Visit Diagnosis: Unsteadiness on feet (R26.81);Muscle weakness (generalized) (M62.81);Pain;Adult, failure to thrive (R62.7);History of falling (Z91.81)   Activity Tolerance Patient limited by fatigue   Patient Left in bed;with call bell/phone within reach   Nurse Communication Other (comment) (Response to treatment)        Time: 1594-5859 OT Time Calculation (min): 31 min  Charges: OT General Charges $OT Visit: 1 Visit OT Treatments $Therapeutic Exercise: 8-22 mins  Tahni Porchia H. OTR/L Supplemental OT, Department of rehab services [phone]   Debra Calabretta R H. 10/16/2020, 12:38 PM

## 2020-10-16 NOTE — Op Note (Signed)
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  Initially accessed the right internal jugular vein under ultrasound guidance and placed a 19 cm palindrome catheter with the tip placed at the cavoatrial junction.  Catheter flushed and aspirated easily.  I did remove the left IJ dialysis catheter that had been previously placed for temporary dialysis access. 2. I then evaluated the veins in his left arm and initial plan was brachiocephalic but the cephalic vein was thrombosed from the antecubitum all the way up to his axilla over an extensive segment.  I elected to place a left brachiobasilic arteriovenous fistula.  Good thrill and a palpable brachial pulse at completion.   SPECIMEN(S):  none  INDICATIONS:   Chase [name] is a 55 y.o. male who presents with AKI on CKD progressed to ESRD.  Vascular surgery was consulted for placement of a tunneled dialysis catheter as well as permanent dialysis access.  Ultimately he presents today after risk benefits discussed and vein mapping obtained.  Plan is left arm access with tunneled dialysis catheter.   DESCRIPTION: After full informed written consent was obtained from the patient, the patient was brought back to the operating room and placed supine upon the operating table.  Prior to induction, the patient received IV antibiotics.   Patient was prepped and draped.  Timeout was performed  Initially his ultrasound to evaluate the right internal jugular vein it was  patent and image was saved.  I accessed this with a micro access needle placed a microwire and then a micro sheath.  I then was able to advance a J-wire into the right atrium.  I measured a 19 cm palindrome catheter over the chest wall.  Ultimately I then made an incision on the chest wall and then another incision at the IJ stick site.  I tunneled the 19 cm catheter through the chest wall.  I buried the cuff under the skin.  I then sequentially dilated over the wire and placed a large dilator peel-away sheath into the right atrium.  The inner dilator and wire removed and I advanced the catheter while peeling sheath away and the tip was placed at the right cavoatrial junction.  Catheter flushed and aspirated well and it was secured with multiple nylon and neck access site closed with 4-0 monocryl suture and Dermabond.  Then turned our attention to the left arm.  I initially evaluated the cephalic vein and this was thrombosed from the antecubitum all the way up to the axilla over a long segment and I did not think it would be usable.  I then evaluated the basilic vein and it looked to be of good caliber.  At this point, I injected local anesthetic to obtain a field block of the antecubitum.  In total, I injected about 10 mL of 1% lidocaine without epinephrine.  I made a transverse incision at the level of the antecubitum and dissected through the subcutaneous tissue and fascia to gain exposure of the brachial artery.  This was noted to be 4 mm in diameter externally.  This was dissected out proximally  and distally and controlled with vessel loops .  I then dissected out the basilic vein.  This was noted to be 4 mm in diameter externally.  The distal segment of the vein was ligated with a  2-0 silk, and the vein was transected.  The proximal segment was interrogated with serial dilators.  The vein accepted up to a 4 mm dilator without any difficulty.  I then instilled the heparinized saline into the vein and clamped  it.  At this point, I reset my exposure of the brachial artery.  The patient was given 3,000 units IV heparin.  I then placed the artery under tension proximally and distally.  I made an arteriotomy with a #11 blade, and then I extended the arteriotomy with a Potts scissor.  I injected heparinized saline proximal and distal to this arteriotomy.  The vein was then sewn to the artery in an end-to-side configuration with a running stitch of 6-0 Prolene.  Prior to completing this anastomosis, I allowed the vein and artery to backbleed.  There was no evidence of clot from any vessels.  I completed the anastomosis in the usual fashion and then released all vessel loops and clamps.    There was a palpable thrill in the venous outflow, and there was a palpable radial pulse.  At this point, I irrigated out the surgical wound.  There was no further active bleeding.  The subcutaneous tissue was reapproximated with a running stitch of 3-0 Vicryl.  The skin was then reapproximated with a running subcuticular stitch of 4-0 Monocryl.  The skin was then cleaned, dried, and reinforced with Dermabond.  The patient tolerated this procedure well.   The temporary dialysis catheter in the left neck was removed at the end of the case and manual pressure was held for 5 minutes.  COMPLICATIONS: None  CONDITION: Stable  [name] MD [facility] Office: [place]   10/16/2020, 2:18 PM

## 2020-10-16 NOTE — Transfer of Care (Signed)
Immediate Anesthesia Transfer of Care Note  Patient: Chase [name]  Procedure(s) Performed: LEFT ARM ARTERIOVENOUS BRACHIOBASILIC FISTULA CREATION (Left: Arm Upper) INSERTION OF RIGHT INTERNAL JUGULAR TUNNELED DIALYSIS CATHETER WITH ULTRASOUND GUIDANCE (Right: Neck)  Patient Location: PACU  Anesthesia Type:General  Level of Consciousness: drowsy and patient cooperative  Airway & Oxygen Therapy: Patient Spontanous Breathing and Patient connected to nasal cannula oxygen  Post-op Assessment: Report given to RN and Post -op Vital signs reviewed and stable  Post vital signs: Reviewed and stable  Last Vitals:  Vitals Value Taken Time  BP 98/69 10/16/20 1427  Temp    Pulse 80 10/16/20 1431  Resp 17 10/16/20 1431  SpO2 93 % 10/16/20 1431  Vitals shown include unvalidated device data.  Last Pain:  Vitals:   10/16/20 1119  TempSrc:   PainSc: 9       Patients Stated Pain Goal: 4 (98/42/10 3128)  Complications: No notable events documented.

## 2020-10-16 NOTE — Anesthesia Procedure Notes (Signed)
Procedure Name: LMA Insertion Date/Time: 10/16/2020 12:42 PM Performed by: Thelma Comp, CRNA Pre-anesthesia Checklist: Patient identified, Emergency Drugs available, Suction available and Patient being monitored Patient Re-evaluated:Patient Re-evaluated prior to induction Oxygen Delivery Method: Circle System Utilized Preoxygenation: Pre-oxygenation with 100% oxygen Induction Type: IV induction Ventilation: Mask ventilation without difficulty LMA: LMA inserted LMA Size: 4.0 Number of attempts: 1 Placement Confirmation: positive ETCO2 Tube secured with: Tape Dental Injury: Teeth and Oropharynx as per pre-operative assessment

## 2020-10-16 NOTE — Anesthesia Postprocedure Evaluation (Signed)
Anesthesia Post Note  Patient: Chase [name]  Procedure(s) Performed: LEFT ARM ARTERIOVENOUS BRACHIOBASILIC FISTULA CREATION (Left: Arm Upper) INSERTION OF RIGHT INTERNAL JUGULAR TUNNELED DIALYSIS CATHETER WITH ULTRASOUND GUIDANCE (Right: Neck)     Patient location during evaluation: PACU Anesthesia Type: General Level of consciousness: awake and alert Pain management: pain level controlled Vital Signs Assessment: post-procedure vital signs reviewed and stable Respiratory status: spontaneous breathing, nonlabored ventilation, respiratory function stable and patient connected to nasal cannula oxygen Cardiovascular status: blood pressure returned to baseline and stable Postop Assessment: no apparent nausea or vomiting Anesthetic complications: no   No notable events documented.  Last Vitals:  Vitals:   10/16/20 1515 10/16/20 1529  BP: (!) 89/45 (!) 95/49  Pulse: 75 81  Resp: 15 18  Temp:    SpO2: 91% 94%    Last Pain:  Vitals:   10/16/20 1500  TempSrc:   PainSc: [place]

## 2020-10-16 NOTE — Anesthesia Preprocedure Evaluation (Signed)
Anesthesia Evaluation  Patient identified by MRN, date of birth, ID band Patient awake    Reviewed: Allergy & Precautions, H&P , NPO status , Patient's Chart, lab work & pertinent test results  Airway Mallampati: II   Neck ROM: full    Dental   Pulmonary shortness of breath,    breath sounds clear to auscultation       Cardiovascular + Past MI   Rhythm:regular Rate:Normal  TTE (06/29/2018): EF 60%   Neuro/Psych  Headaches, PSYCHIATRIC DISORDERS Anxiety Depression CVA    GI/Hepatic GERD  ,H/o gastric bypass   Endo/Other    Renal/GU ESRFRenal diseaseRecent CVVH for acidosis     Musculoskeletal  (+) Arthritis ,   Abdominal   Peds  Hematology  (+) Blood dyscrasia, anemia , Hgb 6.9 H/o hodgkin's lymphoma   Anesthesia Other Findings   Reproductive/Obstetrics                             Anesthesia Physical  Anesthesia Plan  ASA: 3  Anesthesia Plan: General   Post-op Pain Management:    Induction: Intravenous  PONV Risk Score and Plan: 2 and Ondansetron, Dexamethasone and Treatment may vary due to age or medical condition  Airway Management Planned: LMA  Additional Equipment:   Intra-op Plan:   Post-operative Plan: Extubation in OR  Informed Consent: I have reviewed the patients History and Physical, chart, labs and discussed the procedure including the risks, benefits and alternatives for the proposed anesthesia with the patient or authorized representative who has indicated his/her understanding and acceptance.     Dental advisory given  Plan Discussed with: CRNA, Anesthesiologist and Surgeon  Anesthesia Plan Comments:         Anesthesia Quick Evaluation

## 2020-10-16 NOTE — Progress Notes (Signed)
[facility] [place]  Subjective  -no complaints   Objective 111/68 (!) 54 97.8 F (36.6 C) (Oral) 18 99%  Intake/Output Summary (Last 24 hours) at 10/16/2020 1158 Last data filed at 10/16/2020 1137 Gross per 24 hour  Intake 480 ml  Output 2875 ml  Net -2395 ml    Palpable radial brachial pulses bilateral upper extremities  Laboratory Lab Results: Recent Labs    10/14/20 0605 10/16/20 0641  WBC 6.1 5.7  HGB 7.2* 6.9*  HCT 22.5* 22.0*  PLT 170 196   BMET Recent Labs    10/15/20 1639 10/16/20 0641  NA 136 137  137  K 4.2 4.2  4.2  CL 110 110  111  CO2 18* 19*  19*  GLUCOSE 170* 93  92  BUN 51* 53*  52*  CREATININE 6.24* 6.39*  6.33*  CALCIUM 6.6* 6.9*  7.0*    COAG Lab Results  Component Value Date   INR 1.1 10/08/2020   INR 1.0 04/16/2020   INR 1.5 (H) 03/31/2020   No results found for: PTT  Assessment/Planning:   Discussed plan for left arm AV fistula placement with TDC catheter.  Appears to have a nice cephalic vein on vein mapping.  Risks and benefits discussed.  [name] 10/16/2020 11:58 AM --

## 2020-10-16 NOTE — Progress Notes (Signed)
[facility] Progress Note  Name: Chase [name] MRN: [id number] DOB: 1965/10/01  Chief Complaint:  AMS  Subjective:  S/p  AVF/TDC and it due for HD later today.  He is upset and hungry.   UOP 2450- crt stable but over 6  No new complaints otherwise.    ------------- Background on referral: Chase [name] is a 55 y.o. 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  He was supposed to have a TURP  but didn't show up for procedure or pre-op labs.  He was then found to be unresponsive on a welfare check.  He has an indwelling foley.  Nephrology consulted.    We last saw him in 01/02/20 inpatient and he was lost to follow-up with [facility] after that hospitalization after missing multiple appointments.  He did also not establish care with nephrology at the [facility].  He has had interim hospitalizations including one with CRRT.  At a SNF at one point but most recently at home.     Dr. Royce Macadamia had an extensive conversation with the patient, his son (present via phone), and his nurse.  We discussed the risks/benefits/indications of dialysis/RRT.  They do wish to pursue dialysis and consent for renal replacement therapy.  The patient wishes it hadn't come to this but is not ready to transition to comfort care.  He wants to live independently but his son discussed that that hadn't been going well.   Intake/Output Summary (Last 24 hours) at 10/16/2020 1527 Last data filed at 10/16/2020 1421 Gross per 24 hour  Intake 840 ml  Output 2770 ml  Net -1930 ml    Vitals:  Vitals:   10/16/20 1445 10/16/20 1500 10/16/20 1505 10/16/20 1515  BP: (!) 101/56 (!) 90/51 (!) 93/53 (!) 89/45  Pulse: 69 74  75  Resp: 14 16  15   Temp:  97.9 F (36.6 C)    TempSrc:      SpO2: 100% 100%  91%  Weight:      Height:         Physical Exam:  General: thin chronically ill appearing gentleman  HEENT: [place] AT Eyes:  EOMI, anicteric Neck: supple trachea midline Heart: RRR on monitor Lungs: normal WOB on RA Extremities: no edema  GU foley in place Access: - new TDC and left upper arm AVF wit thrill and bruit  Medications reviewed   Labs:  BMP Latest Ref Rng & Units 10/16/2020 10/16/2020 10/15/2020  Glucose 70 - 99 mg/dL 93 92 170(H)  BUN 6 - 20 mg/dL 53(H) 52(H) 51(H)  Creatinine 0.61 - 1.24 mg/dL 6.39(H) 6.33(H) 6.24(H)  Sodium 135 - 145 mmol/L 137 137 136  Potassium 3.5 - 5.1 mmol/L 4.2 4.2 4.2  Chloride 98 - 111 mmol/L 110 111 110  CO2 22 - 32 mmol/L 19(L) 19(L) 18(L)  Calcium 8.9 - 10.3 mg/dL 6.9(L) 7.0(L) 6.6(L)     Assessment/Plan:   # 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  Presented with severe AKI requiring CRRT 7/9. Had BL hydronephrosis despite foley and ureteral stents so underwent nephrostomy tube placement as well.  Despite decompression with good UOP 24h CrCl 3 consistent with ESRD.  - Off CRRT since 7/11, had HD for clearance 7/13; declined HD 7/16 despite expressing desire for longterm HD.  Agreeable to HD Tomorrow and ongoing TIW-  TTS -- Appreicate  VVS consult re: perm access and TDC placement- has been done -Outpt CLIP underway   # Chronic urinary retention: missed scheduled TURP per charting - s/p BL nephrostomy tubes this admission - plan per urology for nephrostomy tubes --> given committed to dialysis now plan will be maintain comfort and avoid infection   # Hx CVA - left hemiparesis - previously at SNF and noted he presented from home most recently   # Anemia - normocytic.  Has rec'd 2u pRBC.   Started ESA 7/11. Will increase dose and feel like he will need blood with HD tomorrow   #BMM: checking PTH- still pending , corr ca a bit low - supplement, phos ok.    Insists upon return home vs SNF - concern expressed about feasibility of plan but he insists with home health and [facility] transport [facility] dialysis he  willl be ok.  Louis Meckel, MD 10/16/2020 3:27 PM

## 2020-10-17 ENCOUNTER — Encounter (HOSPITAL_COMMUNITY): Payer: Self-pay | Admitting: Vascular Surgery

## 2020-10-17 DIAGNOSIS — N185 Chronic kidney disease, stage 5: Secondary | ICD-10-CM | POA: Diagnosis not present

## 2020-10-17 DIAGNOSIS — G9341 Metabolic encephalopathy: Secondary | ICD-10-CM | POA: Diagnosis not present

## 2020-10-17 DIAGNOSIS — F419 Anxiety disorder, unspecified: Secondary | ICD-10-CM | POA: Diagnosis not present

## 2020-10-17 DIAGNOSIS — N179 Acute kidney failure, unspecified: Secondary | ICD-10-CM | POA: Diagnosis not present

## 2020-10-17 LAB — RENAL FUNCTION PANEL
Albumin: 1.7 g/dL — ABNORMAL LOW (ref 3.5–5.0)
Albumin: 1.7 g/dL — ABNORMAL LOW (ref 3.5–5.0)
Anion gap: 10 (ref 5–15)
Anion gap: 10 (ref 5–15)
BUN: 51 mg/dL — ABNORMAL HIGH (ref 6–20)
BUN: 47 mg/dL — ABNORMAL HIGH (ref 6–20)
CO2: 16 mmol/L — ABNORMAL LOW (ref 22–32)
CO2: 19 mmol/L — ABNORMAL LOW (ref 22–32)
Calcium: 7.3 mg/dL — ABNORMAL LOW (ref 8.9–10.3)
Calcium: 7.1 mg/dL — ABNORMAL LOW (ref 8.9–10.3)
Chloride: 109 mmol/L (ref 98–111)
Chloride: 107 mmol/L (ref 98–111)
Creatinine, Ser: 6.12 mg/dL — ABNORMAL HIGH (ref 0.61–1.24)
Creatinine, Ser: 5.62 mg/dL — ABNORMAL HIGH (ref 0.61–1.24)
GFR, Estimated: 10 mL/min — ABNORMAL LOW (ref >60)
GFR, Estimated: 11 mL/min — ABNORMAL LOW (ref >60)
Glucose, Bld: 94 mg/dL (ref 70–99)
Glucose, Bld: 105 mg/dL — ABNORMAL HIGH (ref 70–99)
Phosphorus: 5.6 mg/dL — ABNORMAL HIGH (ref 2.5–4.6)
Phosphorus: 5 mg/dL — ABNORMAL HIGH (ref 2.5–4.6)
Potassium: 4.4 mmol/L (ref 3.5–5.1)
Potassium: 4.4 mmol/L (ref 3.5–5.1)
Sodium: 135 mmol/L (ref 135–145)
Sodium: 136 mmol/L (ref 135–145)

## 2020-10-17 LAB — HEMOGLOBIN AND HEMATOCRIT, BLOOD
HCT: 23.9 % — ABNORMAL LOW (ref 39.0–52.0)
Hemoglobin: 7.8 g/dL — ABNORMAL LOW (ref 13.0–17.0)

## 2020-10-17 LAB — MAGNESIUM: Magnesium: 2.2 mg/dL (ref 1.7–2.4)

## 2020-10-17 LAB — PREPARE RBC (CROSSMATCH)

## 2020-10-17 MED ORDER — HEPARIN SODIUM (PORCINE) 1000 UNIT/ML IJ SOLN
INTRAMUSCULAR | Status: AC
  Filled 2020-10-17: qty 4

## 2020-10-17 MED ORDER — HYDROCODONE-ACETAMINOPHEN 5-325 MG PO TABS
ORAL_TABLET | ORAL | Status: AC
  Filled 2020-10-17: qty 2

## 2020-10-17 MED ORDER — SODIUM CHLORIDE 0.9% IV SOLUTION: Freq: Once | INTRAVENOUS | Status: DC

## 2020-10-17 NOTE — Progress Notes (Addendum)
Progress Note    10/17/2020 7:50 AM 1 Day Post-Op  Subjective:  Denies hand pain.   Vitals:   10/16/20 2048 10/17/20 0459  BP: 100/65 101/60  Pulse: 75 62  Resp: 20 20  Temp: 98.1 F (36.7 C) 98.7 F (37.1 C)  SpO2: 93% 97%    Physical Exam: General appearance: awake, alert in NAD Chest: catheter site without bleeding or erythema. Dressing dry and intact. Respirations: unlabored; no dyspnea at rest Left upper extremity: Hand is warm with 5/5 grip strength. Motor function and sensation intact. Good bruit and thrill in fistula. 2+ radial pulse. Incision(s): Well approximated. No active bleeding or hematoma.   EXAM: PORTABLE CHEST 1 VIEW   COMPARISON:  10/08/2020   FINDINGS: Previously seen left jugular central line is been removed. New right jugular dialysis catheter is noted in satisfactory position in the distal superior vena cava. No pneumothorax is noted. Elevation the right hemidiaphragm is noted with some compressive atelectasis. Left lung is clear.   IMPRESSION: No evidence of pneumothorax following catheter placement.     Electronically Signed   By: Inez Catalina M.D.   On: 10/16/2020 16:01   CBC    Component Value Date/Time   WBC 5.7 10/16/2020 0641   RBC 2.20 (L) 10/16/2020 0641   HGB 6.9 (LL) 10/16/2020 0641   HCT 22.0 (L) 10/16/2020 0641   PLT 196 10/16/2020 0641   MCV 100.0 10/16/2020 0641   MCH 31.4 10/16/2020 0641   MCHC 31.4 10/16/2020 0641   RDW 15.3 10/16/2020 0641   LYMPHSABS 0.5 (L) 10/06/2020 1805   MONOABS 0.8 10/06/2020 1805   EOSABS 0.0 10/06/2020 1805   BASOSABS 0.0 10/06/2020 1805    BMET    Component Value Date/Time   NA 135 10/17/2020 0419   K 4.4 10/17/2020 0419   CL 109 10/17/2020 0419   CO2 16 (L) 10/17/2020 0419   GLUCOSE 94 10/17/2020 0419   BUN 51 (H) 10/17/2020 0419   CREATININE 6.12 (H) 10/17/2020 0419   CALCIUM 7.3 (L) 10/17/2020 0419   CALCIUM 6.4 (LL) 10/15/2020 0425   GFRNONAA 10 (L) 10/17/2020 0419    GFRAA 12 (L) 01/04/2020 0453     Intake/Output Summary (Last 24 hours) at 10/17/2020 0750 Last data filed at 10/17/2020 0600 Gross per 24 hour  Intake 1331 ml  Output 1920 ml  Net -589 ml    HOSPITAL MEDICATIONS Scheduled Meds:  calcium carbonate  2 tablet Oral TID WC   Chlorhexidine Gluconate Cloth  6 each Topical Daily   Chlorhexidine Gluconate Cloth  6 each Topical Q0600   [START ON 10/20/2020] darbepoetin (ARANESP) injection - DIALYSIS  200 mcg Subcutaneous Q7 days   mouth rinse  15 mL Mouth Rinse BID   midodrine  10 mg Oral Q T,Th,Sa-HD   multivitamin with minerals  1 tablet Oral BID   pantoprazole  40 mg Oral Q1200   sodium chloride flush  5 mL Intracatheter Q8H   Continuous Infusions:  sodium chloride 250 mL (10/09/20 1732)   norepinephrine (LEVOPHED) Adult infusion Stopped (10/08/20 0401)   PRN Meds:.atropine, fentaNYL (SUBLIMAZE) injection, heparin, HYDROcodone-acetaminophen, ondansetron  Assessment and Plan:  POD 1 Ultrasound-guided access right internal jugular vein Placement of right internal jugular vein tunneled dialysis catheter (19 cm palindrome) Removal of left IJ temporary dialysis catheter Left first stage basilic vein transposition (brachiobasilic arteriovenous fistula placement)  Stable post-op. Follow-up in 4-6 weeks with duplex of AVF.  Risa Grill, PA-C [facility] [phone] 10/17/2020  7:50 AM   I have seen and evaluated the patient. I agree with the PA note as documented above.  Postop day 1 status post placement of right IJ tunneled dialysis catheter and a left first stage brachiobasilic fistula.  Fistula has a good thrill with a palpable radial pulse.  No signs of steal.  Discussed we will arrange follow-up in 4 to 6 weeks in the office with fistula duplex and will need second stage transposition.  The catheter can be used.  Vascular will sign off.  Please call with questions or concerns.  [name],  MD [facility] [place] Office: [phone]

## 2020-10-17 NOTE — Progress Notes (Signed)
[facility] Progress Note  Name: Chase [name] MRN: [id number] DOB: 11/17/1965    Subjective:  S/p  AVF/TDC on 7/19 and it due for HD later today.  He is upset and frustrated with the hospital systems   UOP 1750- crt stable but over 6  No new complaints otherwise.    ------------- Background on referral: Chase [name] is a 55 y.o. 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  He was supposed to have a TURP  but didn't show up for procedure or pre-op labs.  He was then found to be unresponsive on a welfare check.  He has an indwelling foley.  Nephrology consulted.    We last saw him in 01/02/20 inpatient and he was lost to follow-up with [facility] after that hospitalization after missing multiple appointments.  He did also not establish care with nephrology at the [facility]  He has had interim hospitalizations including one with CRRT.  At a SNF at one point but most recently at home.     Dr. Royce [name] had an extensive conversation with the patient, his son (present via phone), and his nurse.  We discussed the risks/benefits/indications of dialysis/RRT.  They do wish to pursue dialysis and consent for renal replacement therapy.  The patient wishes it hadn't come to this but is not ready to transition to comfort care.  He wants to live independently but his son discussed that that hadn't been going well.   Intake/Output Summary (Last 24 hours) at 10/17/2020 1150 Last data filed at 10/17/2020 1134 Gross per 24 hour  Intake 1321 ml  Output 1995 ml  Net -674 ml    Vitals:  Vitals:   10/16/20 1800 10/16/20 2048 10/17/20 0459 10/17/20 0847  BP: 116/63 100/65 101/60 112/63  Pulse: 80 75 62 67  Resp: 17 20 20 17   Temp: 97.8 F (36.6 C) 98.1 F (36.7 C) 98.7 F (37.1 C) 98.7 F (37.1 C)  TempSrc: Oral Oral Oral Oral  SpO2: 95% 93% 97% 98%  Weight:      Height:         Physical Exam:   General: thin chronically ill appearing gentleman  HEENT: [place] AT Eyes: EOMI, anicteric Neck: supple trachea midline Heart: RRR on monitor Lungs: normal WOB on RA Extremities: no edema  GU foley in place Access: - new TDC and left upper arm AVF wit thrill and bruit  Medications reviewed   Labs:  BMP Latest Ref Rng & Units 10/17/2020 10/16/2020 10/16/2020  Glucose 70 - 99 mg/dL 94 146(H) 93  BUN 6 - 20 mg/dL 51(H) 48(H) 53(H)  Creatinine 0.61 - 1.24 mg/dL 6.12(H) 5.93(H) 6.39(H)  Sodium 135 - 145 mmol/L 135 135 137  Potassium 3.5 - 5.1 mmol/L 4.4 3.9 4.2  Chloride 98 - 111 mmol/L 109 109 110  CO2 22 - 32 mmol/L 16(L) 17(L) 19(L)  Calcium 8.9 - 10.3 mg/dL 7.3(L) 7.1(L) 6.9(L)     Assessment/Plan:   # 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  Presented with severe AKI requiring CRRT 7/9. Had BL hydronephrosis despite foley and ureteral stents so underwent nephrostomy tube placement as well.  Despite decompression with good UOP 24h CrCl 3 consistent with ESRD.  - Off CRRT 7/11, had HD for clearance 7/13; declined HD 7/16 despite expressing desire for longterm HD.  Agreeable to HD Today  and ongoing TIW-  TTS -- Appreicate VVS consult re: perm access and TDC placement- has been done -Outpt CLIP underway but not sure they know that he wants to do dialysis at the [place] in [place]    # Chronic urinary retention: missed scheduled TURP per charting - s/p BL nephrostomy tubes this admission - plan per urology for nephrostomy tubes --> given committed to dialysis now plan will be maintain comfort and avoid infection   # Hx CVA - left hemiparesis - previously at SNF and noted he presented from home most recently   # Anemia - normocytic.  Has rec'd 2u pRBC.   Started ESA 7/11. Will increase dose and feel like he will need blood with HD today-  has been ordered  #BMM: PTH 64 , corr ca a bit low - supplement, phos ok.     Insists upon return home vs SNF - concern expressed about feasibility of plan but he insists with home health and [facility] transport to dialysis in [place], [place] 10/17/2020 11:50 AM

## 2020-10-17 NOTE — Progress Notes (Signed)
PROGRESS NOTE    Chase [name]  PFX:[id number] DOB: 26-Apr-1965 DOA: 10/06/2020 PCP: [name]   Brief Narrative:  Chase [name] is a 55 y.o. male with a PMH significant for CKD stage V, hx of GIB and ulcers requiring gastric bypass, prior stroke, hodgkin's lymphoma, prior MI, cervical radiculopathy, chronic pain syndrome, COVID, and PTSD who presented to the ED via EMS after being found minimally responsive on welfare check. On EMS arrival patient was found in recliner alert but confused. On chart review it appears patient was scheduled for pre op lab work 7/6 and TURP 7/7 but failed to show for either appointment. Patient has an extensive past medical history to include CKD stage 5 that progressed to acute renal failure march of this year and required CRRT. He also has indwelling catheter and bilateral nephrostomy tubes for hydronephrosis. He presents altered but in stable condition with no immediate needs for hemodialysis. Acidosis has improved with bicarb drip. Nephrology has already been consulted.  Assessment & Plan:   Principal Problem:   Acute renal failure superimposed on stage 4 chronic kidney disease (HCC) Active Problems:   BPH (benign prostatic hyperplasia)   Postlaminectomy syndrome, not elsewhere classified   PTSD (post-traumatic stress disorder)   AKI (acute kidney injury) ([place])   Anxiety and depression   Anemia in chronic kidney disease   Acute metabolic encephalopathy   Acute renal failure (ARF) (HCC)  Acute on chronic renal failure - CKD-V  Progressing to ESRD transitioning to dialysis TTS - Related to resumed worsening prostatic/obstruction given outpatient plan TURP and missed appointments/noncompliance - Status post bilateral nephrostomy tubes - previously requiring CRRT (off as of 10/09/20).  - Nephrology transitioning patient to intermittent hemodialysis - TDC and and fistula formation 10/16/20 - tolerated placement and formation well -  Vascular/Nephrology following - appreciate insight and recommendations  Chronic anemia, likely of chronic disease and malnutrition - Received 2 units pRBC 7/11 - Hemoglobin minimally downtrending, 6.9 this morning, nephrology to initiate EPO -defer to their expertise on additional transfusion during dialysis or follow along with a EPO only  Failure to thrive, severe protein calorie malnutrition - Continue to encourage PO intake - liberalize diet and increase PT/OT  Hypotension:  - Related to chronic wasting and acute acidemia, improved, off pressors. Continue midodrine.  Recurrent diverticulitis with abscess formation leading to complex urological obstruction- s/p diverting ostomy with [facility] resection of redundant sigmoid colon.  Unclear if he will ever be candidate for re-anastamosis to rectal stump.  DVT prophylaxis: holding in the setting of anemia Code Status: DNR Family Communication: Patient continues to update son himself per our discussion  Status is: Inpt  Dispo: The patient is from: Home              Anticipated d/c is to: TBD - requesting DC home when possible              Anticipated d/c date is: >72h              Patient currently NOT medically stable for discharge -we will need to transition to intermittent hemodialysis, will require dialysis catheter placement as well as clipping for safe disposition.  Consultants:  Nephrology, Urology  Antimicrobials:  None   Subjective: No acute issues or events overnight; tolerated temporary dialysis catheter and fistula creation yesterday quite well -pain currently well controlled, tolerating dialysis quite well per himself.  Continue to encourage increased p.o. intake and ambulation with physical therapy given his wishes to return  home  Objective: Vitals:   10/16/20 1529 10/16/20 1800 10/16/20 2048 10/17/20 0459  BP: (!) 95/49 116/63 100/65 101/60  Pulse: 81 80 75 62  Resp: 18 17 20 20   Temp:  97.8 F (36.6 C) 98.1 F  (36.7 C) 98.7 F (37.1 C)  TempSrc:  Oral Oral Oral  SpO2: 94% 95% 93% 97%  Weight:      Height:        Intake/Output Summary (Last 24 hours) at 10/17/2020 0748 Last data filed at 10/17/2020 0600 Gross per 24 hour  Intake 1331 ml  Output 1920 ml  Net -589 ml    Filed Weights   10/11/20 1200 10/11/20 1505 10/14/20 2051  Weight: 52 kg 51 kg 51 kg    Examination:  General: Gaunt appearing gentleman pleasantly resting in bed, No acute distress. HEENT:  Normocephalic atraumatic.  Sclerae nonicteric, noninjected.  Extraocular movements intact bilaterally. Neck:  Without mass or deformity.  Trachea is midline.   Lungs:  Clear to auscultate bilaterally without rhonchi, wheeze, or rales. Heart:  Regular rate and rhythm.  Without murmurs, rubs, or gallops. Abdomen:  Soft, nontender, nondistended.  Without guarding or rebound. Nephrostomy tubes -minimal yellow urine output; ostomy in place; Foley draining clear yellow urine. Extremities: Without cyanosis, clubbing, edema, or obvious deformity.  Left upper extremity surgical site clean dry intact Vascular: Dorsalis pedis and posterior tibial pulses palpable bilaterally.  Right dialysis catheter in place, bandage clean dry intact Skin: Warm and dry, no erythema   Data Reviewed: I have personally reviewed following labs and imaging studies  CBC: Recent Labs  Lab 10/11/20 1100 10/12/20 0708 10/14/20 0605 10/16/20 0641  WBC 9.7 7.2 6.1 5.7  HGB 7.3* 7.7* 7.2* 6.9*  HCT 22.3* 23.4* 22.5* 22.0*  MCV 95.3 94.7 99.6 100.0  PLT 155 159 170 850    Basic Metabolic Panel: Recent Labs  Lab 10/13/20 0458 10/13/20 1901 10/14/20 0605 10/14/20 1620 10/15/20 0425 10/15/20 1639 10/16/20 0641 10/16/20 1920 10/17/20 0419  NA 134*   < > 134*   < > 132* 136 137  137 135 135  K 3.6   < > 4.0   < > 4.0 4.2 4.2  4.2 3.9 4.4  CL 103   < > 106   < > 104 110 110  111 109 109  CO2 22   < > 20*   < > 20* 18* 19*  19* 17* 16*  GLUCOSE 124*    < > 96   < > 95 170* 93  92 146* 94  BUN 25*   < > 38*   < > 41* 51* 53*  52* 48* 51*  CREATININE 4.14*   < > 5.14*   < > 5.59* 6.24* 6.39*  6.33* 5.93* 6.12*  CALCIUM 6.4*   < > 6.6*   < > 6.8*  6.4* 6.6* 6.9*  7.0* 7.1* 7.3*  MG 2.1  --  2.2  --  2.2  --  2.4  --  2.2  PHOS 4.2   < > 4.9*   < > 4.8* 4.5 5.4* 5.9* 5.6*   < > = values in this interval not displayed.    GFR: Estimated Creatinine Clearance: 9.7 mL/min (A) (by C-G formula based on SCr of 6.12 mg/dL (H)). Liver Function Tests: Recent Labs  Lab 10/15/20 0425 10/15/20 1639 10/16/20 0641 10/16/20 1920 10/17/20 0419  ALBUMIN 1.8* 1.7* 1.7* 1.8* 1.7*    No results for input(s): LIPASE, AMYLASE in the last  168 hours. No results for input(s): AMMONIA in the last 168 hours. Coagulation Profile: No results for input(s): INR, PROTIME in the last 168 hours.  Cardiac Enzymes: No results for input(s): CKTOTAL, CKMB, CKMBINDEX, TROPONINI in the last 168 hours. BNP (last 3 results) No results for input(s): PROBNP in the last 8760 hours. HbA1C: No results for input(s): HGBA1C in the last 72 hours. CBG: Recent Labs  Lab 10/14/20 1648 10/15/20 1139 10/15/20 1731  GLUCAP 116* 106* 115*    Lipid Profile: No results for input(s): CHOL, HDL, LDLCALC, TRIG, CHOLHDL, LDLDIRECT in the last 72 hours. Thyroid Function Tests: No results for input(s): TSH, T4TOTAL, FREET4, T3FREE, THYROIDAB in the last 72 hours. Anemia Panel: No results for input(s): VITAMINB12, FOLATE, FERRITIN, TIBC, IRON, RETICCTPCT in the last 72 hours. Sepsis Labs: No results for input(s): PROCALCITON, LATICACIDVEN in the last 168 hours.   Recent Results (from the past 240 hour(s))  MRSA Next Gen by PCR, Nasal     Status: None   Collection Time: 10/07/20  1:37 PM   Specimen: Nasal Mucosa; Nasal Swab  Result Value Ref Range Status   MRSA by PCR Next Gen NOT DETECTED NOT DETECTED Final    Comment: (NOTE) The GeneXpert MRSA Assay (FDA approved for  NASAL specimens only), is one component of a comprehensive MRSA colonization surveillance program. It is not intended to diagnose MRSA infection nor to guide or monitor treatment for MRSA infections. Test performance is not FDA approved in patients less than 57 years old. Performed at [facility], [place] [address] [place], [place] [address]   Aerobic/Anaerobic Culture w Gram Stain (surgical/deep wound)     Status: None   Collection Time: 10/08/20 12:02 PM   Specimen: Urine, Random  Result Value Ref Range Status   Specimen Description URINE, RANDOM  Final   Special Requests LEFT KIDNEY  Final   Gram Stain   Final    ABUNDANT WBC PRESENT,BOTH PMN AND MONONUCLEAR ABUNDANT GRAM VARIABLE ROD    Culture   Final    ABUNDANT ESCHERICHIA COLI ABUNDANT PSEUDOMONAS AERUGINOSA ABUNDANT ENTEROCOCCUS FAECIUM NO ANAEROBES ISOLATED Performed at [facility], [place] [address], [place], [place] [address]    Report Status 10/14/2020 FINAL  Final   Organism ID, Bacteria ESCHERICHIA COLI  Final   Organism ID, Bacteria ENTEROCOCCUS FAECIUM  Final   Organism ID, Bacteria PSEUDOMONAS AERUGINOSA  Final      Susceptibility   Escherichia coli - MIC*    AMPICILLIN <=2 SENSITIVE Sensitive     CEFAZOLIN <=4 SENSITIVE Sensitive     CEFEPIME <=0.12 SENSITIVE Sensitive     CEFTRIAXONE <=0.25 SENSITIVE Sensitive     CIPROFLOXACIN <=0.25 SENSITIVE Sensitive     GENTAMICIN <=1 SENSITIVE Sensitive     IMIPENEM <=0.25 SENSITIVE Sensitive     NITROFURANTOIN <=16 SENSITIVE Sensitive     TRIMETH/SULFA <=20 SENSITIVE Sensitive     AMPICILLIN/SULBACTAM <=2 SENSITIVE Sensitive     PIP/TAZO <=4 SENSITIVE Sensitive     * ABUNDANT ESCHERICHIA COLI   Enterococcus faecium - MIC*    AMPICILLIN >=32 RESISTANT Resistant     NITROFURANTOIN <=16 SENSITIVE Sensitive     VANCOMYCIN <=0.5 SENSITIVE Sensitive     * ABUNDANT ENTEROCOCCUS FAECIUM   Pseudomonas aeruginosa - MIC*    CEFTAZIDIME 4 SENSITIVE Sensitive      CIPROFLOXACIN <=0.25 SENSITIVE Sensitive     GENTAMICIN <=1 SENSITIVE Sensitive     IMIPENEM 8 INTERMEDIATE Intermediate     PIP/TAZO <=  4 SENSITIVE Sensitive     CEFEPIME 2 SENSITIVE Sensitive     * ABUNDANT PSEUDOMONAS AERUGINOSA  Surgical pcr screen     Status: None   Collection Time: 10/15/20  9:40 PM   Specimen: Nasal Mucosa; Nasal Swab  Result Value Ref Range Status   MRSA, PCR NEGATIVE NEGATIVE Final   Staphylococcus aureus NEGATIVE NEGATIVE Final    Comment: (NOTE) The Xpert SA Assay (FDA approved for NASAL specimens in patients 55 years of age and older), is one component of a comprehensive surveillance program. It is not intended to diagnose infection nor to guide or monitor treatment. Performed at [facility], [place] [address], [place], [place] [address]      Radiology Studies: DG Chest Port 1 View  Result Date: 10/16/2020 CLINICAL DATA:  Status post dialysis catheter placement EXAM: PORTABLE CHEST 1 VIEW COMPARISON:  10/08/2020 FINDINGS: Previously seen left jugular central line is been removed. New right jugular dialysis catheter is noted in satisfactory position in the distal superior vena cava. No pneumothorax is noted. Elevation the right hemidiaphragm is noted with some compressive atelectasis. Left lung is clear. IMPRESSION: No evidence of pneumothorax following catheter placement. Electronically Signed   By: Inez Catalina M.D.   On: 10/16/2020 16:01   HYBRID OR IMAGING ([facility] ONLY)  Result Date: 10/16/2020 There is no interpretation for this exam.  This order is for images obtained during a surgical procedure.  Please See "Surgeries" Tab for more information regarding the procedure.    Scheduled Meds:  calcium carbonate  2 tablet Oral TID WC   Chlorhexidine Gluconate Cloth  6 each Topical Daily   Chlorhexidine Gluconate Cloth  6 each Topical Q0600   [START ON 10/20/2020] darbepoetin (ARANESP) injection - DIALYSIS  200 mcg Subcutaneous Q7 days   mouth rinse   15 mL Mouth Rinse BID   midodrine  10 mg Oral Q T,Th,Sa-HD   multivitamin with minerals  1 tablet Oral BID   pantoprazole  40 mg Oral Q1200   sodium chloride flush  5 mL Intracatheter Q8H   Continuous Infusions:  sodium chloride 250 mL (10/09/20 1732)   norepinephrine (LEVOPHED) Adult infusion Stopped (10/08/20 0401)     LOS: 11 days   Time spent: 30 min  Little Ishikawa, DO [facility]  If 7PM-7AM, please contact night-coverage [email or web address]  10/17/2020, 7:48 AM

## 2020-10-17 NOTE — Progress Notes (Signed)
Nutrition Follow-up  DOCUMENTATION CODES:  Underweight, Severe malnutrition in context of chronic illness  INTERVENTION:  Continue Magic cup TID with meals Continue double protein portions at meals  Continue MVI with minerals BID Continue calcium carbonate 500 mg TID taken WITH meals  Recommend repletion of Vitamin C, Zinc, and Copper given deficiencies. Consider: -500mg  Vitamin C BID -15 mg PO BID elemental zinc -For every 15mg  of zinc, supplement 1 mg of copper  Supplementation of zinc/copper should NOT be continued long-term. Please refer to AVS discharge instructions for information regarding recommended vitamin/mineral regimen once deficiencies have been corrected  Will monitor for results of remaining labs: thiamine & vitamin D  NUTRITION DIAGNOSIS:  Severe Malnutrition related to chronic illness (hydronephrosis, gastric bypass) as evidenced by severe fat depletion, severe muscle depletion, energy intake < or equal to 75% for > or equal to 1 month, percent weight loss. -- ongoing  GOAL:  Patient will meet greater than or equal to 90% of their needs -- progressing  MONITOR:  PO intake, Weight trends, Supplement acceptance, Labs, I & O's  REASON FOR ASSESSMENT:  Malnutrition Screening Tool    ASSESSMENT:  Patient with PMH significant for CKD IV, GIB, ulcers requiring gastric bypass (on 08/24/19), CVA, hodgkin's lymphoma, MI, cervical radiculopathy, recent COVID, recurrent diverticulitis with abscess formation s/p diverting ostomy with [facility] resection of redundant sigmoid colon, and bilateral hydronephrosis scheduled for TURP on 7/7 but was unable to show. Presents this admission with ARF and failure to thrive.  7/9- CRRT initiated, had bilateral hydronephrosis despite foley and ureteral stents  7/10- s/p IR placement of bilateral nephrostomy tubes  7/11-off CRRT 7/13- HD for clearance 7/16- declined HD despite expressing desire for long-term HD 7/18- pt underwent the  following procedures 2/2 AKI on CKD now ESRD: ultrasound-guided access right internal jugular vein; placement of right internal jugular vein tunneled dialysis catheter (19 cm palindrome); removal of left IJ temporary dialysis catheter; left first stage basilic vein transposition (brachiobasilic arteriovenous fistula placement); diet liberalized to regular  Per MD, pt tolerated placement and formation well. Pt unavailable at time of RD visit, in HD. Per RN, pt is "frustrated with the hospital systems" today. Pt still has desire to discharge home with home health despite recommendations for SNF.  PO Intake: 50-100% x last 8 meal completions (84% average intake)   UOP:1750 ml x 24 hrs   Vitamin/Mineral Profile:  Thiamine: pending Vitamin A: 22.7 (WDL) Vitamin D: pending  Vitamin C: 0.1 (L) Copper: 65 (L) Zinc: 40 (L)  Sent secure chat to MD regarding micronutrient deficiencies and need/recommendations for repletion.  Medications: tums TID, aranesp, mvi with minerals, protonix Labs: Cr 5.62 (H, down from yesterday), PO4 5.0 (H, down from yesterday) CBGs 116-106-115  Diet Order:   Diet Order             Diet regular Room service appropriate? Yes; Fluid consistency: Thin  Diet effective now                  EDUCATION NEEDS:  Education needs have been addressed  Skin:  Skin Assessment: Skin Integrity Issues: Skin Integrity Issues:: Stage II, Incisions Stage II: buttocks Incisions: L arm, R neck  Last BM:  7/18  Height:  Ht Readings from Last 1 Encounters:  10/06/20 5\' 6"  (1.676 m)   Weight:  Wt Readings from Last 1 Encounters:  10/14/20 51 kg   BMI:  Body mass index is 18.15 kg/m.  Estimated Nutritional Needs:  Kcal:  1500-1700  kcal Protein:  75-90 grams Fluid:  >/= 1.5 L/day   Larkin Ina, MS, RD, LDN (she/her/hers) RD pager number and weekend/on-call pager number located in [place].

## 2020-10-17 NOTE — Plan of Care (Signed)
  Problem: Elimination: Goal: Will not experience complications related to bowel motility Outcome: Completed/Met Goal: Will not experience complications related to urinary retention Outcome: Completed/Met

## 2020-10-18 ENCOUNTER — Encounter (HOSPITAL_COMMUNITY): Payer: Self-pay | Admitting: Vascular Surgery

## 2020-10-18 DIAGNOSIS — N179 Acute kidney failure, unspecified: Secondary | ICD-10-CM | POA: Diagnosis not present

## 2020-10-18 DIAGNOSIS — Z7189 Other specified counseling: Secondary | ICD-10-CM | POA: Diagnosis not present

## 2020-10-18 DIAGNOSIS — Z515 Encounter for palliative care: Secondary | ICD-10-CM | POA: Diagnosis not present

## 2020-10-18 DIAGNOSIS — G9341 Metabolic encephalopathy: Secondary | ICD-10-CM | POA: Diagnosis not present

## 2020-10-18 LAB — RENAL FUNCTION PANEL
Albumin: 1.8 g/dL — ABNORMAL LOW (ref 3.5–5.0)
Anion gap: 9 (ref 5–15)
BUN: 25 mg/dL — ABNORMAL HIGH (ref 6–20)
CO2: 25 mmol/L (ref 22–32)
Calcium: 7.1 mg/dL — ABNORMAL LOW (ref 8.9–10.3)
Chloride: 103 mmol/L (ref 98–111)
Creatinine, Ser: 3.47 mg/dL — ABNORMAL HIGH (ref 0.61–1.24)
GFR, Estimated: 20 mL/min — ABNORMAL LOW (ref >60)
Glucose, Bld: 99 mg/dL (ref 70–99)
Phosphorus: 3.1 mg/dL (ref 2.5–4.6)
Potassium: 3.3 mmol/L — ABNORMAL LOW (ref 3.5–5.1)
Sodium: 137 mmol/L (ref 135–145)

## 2020-10-18 LAB — CBC
HCT: 24.2 % — ABNORMAL LOW (ref 39.0–52.0)
Hemoglobin: 7.6 g/dL — ABNORMAL LOW (ref 13.0–17.0)
MCH: 30.3 pg (ref 26.0–34.0)
MCHC: 31.4 g/dL (ref 30.0–36.0)
MCV: 96.4 fL (ref 80.0–100.0)
Platelets: 255 10*3/uL (ref 150–400)
RBC: 2.51 MIL/uL — ABNORMAL LOW (ref 4.22–5.81)
RDW: 15.1 % (ref 11.5–15.5)
WBC: 4.9 10*3/uL (ref 4.0–10.5)
nRBC: 0 % (ref 0.0–0.2)

## 2020-10-18 LAB — BPAM RBC
Blood Product Expiration Date: 202208212359
ISSUE DATE / TIME: 202207191313
Unit Type and Rh: 5100

## 2020-10-18 LAB — TYPE AND SCREEN
ABO/RH(D): O POS
Antibody Screen: NEGATIVE
Unit division: 0

## 2020-10-18 LAB — VITAMIN B1: Vitamin B1 (Thiamine): 142.9 nmol/L (ref 66.5–200.0)

## 2020-10-18 LAB — MAGNESIUM: Magnesium: 2.1 mg/dL (ref 1.7–2.4)

## 2020-10-18 MED ORDER — CHLORHEXIDINE GLUCONATE CLOTH 2 % EX PADS
6.0000 | MEDICATED_PAD | Freq: Every day | CUTANEOUS | Status: DC
  Administered 2020-10-21 – 2020-10-22 (×2): 6 via TOPICAL

## 2020-10-18 NOTE — Progress Notes (Signed)
Referring Physician(s): Borden,L  Supervising Physician: Dr. Kathlene Cote  Patient Status:  [facility] - In-pt  Chief Complaint:  Flank pain, renal failure, bilateral hydronephrosis despite ureteral stents  Subjective: Patient resting in bed. Has undergone tunneled HD cath and creation of (L)UE AVF since I last saw him.    Allergies: Nsaids and Ciprofloxacin  Medications:  Current Facility-Administered Medications:    0.9 %  sodium chloride infusion (Manually program via Guardrails IV Fluids), , Intravenous, Once, Corliss Parish, MD   0.9 %  sodium chloride infusion, 250 mL, Intravenous, Continuous, Rhyne, Samantha J, PA-C, Last Rate: 10 mL/hr at 10/09/20 1732, New Bag at 10/16/20 1334   atropine 1 MG/10ML injection 0.5 mg, 0.5 mg, Intravenous, PRN, Rhyne, Samantha J, PA-C, 0.5 mg at 10/08/20 1035   calcium carbonate (TUMS - dosed in mg elemental calcium) chewable tablet 400 mg of elemental calcium, 2 tablet, Oral, TID WC, Rhyne, Samantha J, PA-C, 400 mg of elemental calcium at 10/17/20 0805   Chlorhexidine Gluconate Cloth 2 % PADS 6 each, 6 each, Topical, Daily, Rhyne, Samantha J, PA-C, 6 each at 10/18/20 1122   [START ON 10/19/2020] Chlorhexidine Gluconate Cloth 2 % PADS 6 each, 6 each, Topical, Q0600, Corliss Parish, MD   Derrill Memo ON 10/20/2020] Darbepoetin Alfa (ARANESP) injection 200 mcg, 200 mcg, Subcutaneous, Q7 days, Corliss Parish, MD   fentaNYL (SUBLIMAZE) injection 50 mcg, 50 mcg, Intravenous, Q2H PRN, Rhyne, Samantha J, PA-C, 50 mcg at 10/18/20 1311   heparin injection 1,000-6,000 Units, 1,000-6,000 Units, CRRT, PRN, Rhyne, Hulen Shouts, PA-C, 3,000 Units at 10/09/20 0954   HYDROcodone-acetaminophen (NORCO/VICODIN) 5-325 MG per tablet 1-2 tablet, 1-2 tablet, Oral, Q4H PRN, Rhyne, Samantha J, PA-C, 2 tablet at 10/18/20 1118   MEDLINE mouth rinse, 15 mL, Mouth Rinse, BID, Rhyne, Samantha J, PA-C, 15 mL at 10/18/20 1122   midodrine (PROAMATINE) tablet 10 mg, 10 mg,  Oral, Q T,Th,Sa-HD, Rhyne, Samantha J, PA-C, 10 mg at 10/12/20 1149   multivitamin with minerals tablet 1 tablet, 1 tablet, Oral, BID, Rhyne, Samantha J, PA-C, 1 tablet at 10/18/20 1119   norepinephrine (LEVOPHED) 4mg  in 25mL premix infusion, 2-10 mcg/min, Intravenous, Titrated, Rhyne, Samantha J, PA-C, Stopped at 10/08/20 0401   ondansetron (ZOFRAN) injection 4 mg, 4 mg, Intravenous, Q6H PRN, Rhyne, Samantha J, PA-C   pantoprazole (PROTONIX) EC tablet 40 mg, 40 mg, Oral, Q1200, Rhyne, Samantha J, PA-C, 40 mg at 10/18/20 1119   sodium chloride flush (NS) 0.9 % injection 5 mL, 5 mL, Intracatheter, Q8H, Rhyne, Samantha J, PA-C, 5 mL at 10/17/20 2107    Vital Signs: BP (!) 101/59 (BP Location: Right Arm)   Pulse 78   Temp 98 F (36.7 C) (Oral)   Resp 18   Ht 5\' 6"  (1.676 m)   Wt 51 kg   SpO2 98%   BMI 18.15 kg/m   Physical Exam awake/alert; L/R PCN's intact, sites clean, dry. Mildly tender. Both drains with dark UOP with debris/sediment.  Imaging: DG Chest Port 1 View  Result Date: 10/16/2020 CLINICAL DATA:  Status post dialysis catheter placement EXAM: PORTABLE CHEST 1 VIEW COMPARISON:  10/08/2020 FINDINGS: Previously seen left jugular central line is been removed. New right jugular dialysis catheter is noted in satisfactory position in the distal superior vena cava. No pneumothorax is noted. Elevation the right hemidiaphragm is noted with some compressive atelectasis. Left lung is clear. IMPRESSION: No evidence of pneumothorax following catheter placement. Electronically Signed   By: Inez Catalina M.D.   On: 10/16/2020  16:01   HYBRID OR IMAGING ([facility] ONLY)  Result Date: 10/16/2020 There is no interpretation for this exam.  This order is for images obtained during a surgical procedure.  Please See "Surgeries" Tab for more information regarding the procedure.    Labs:  CBC: Recent Labs    10/12/20 0708 10/14/20 0605 10/16/20 0641 10/17/20 1952 10/18/20 0606  WBC 7.2 6.1 5.7  --   4.9  HGB 7.7* 7.2* 6.9* 7.8* 7.6*  HCT 23.4* 22.5* 22.0* 23.9* 24.2*  PLT 159 170 196  --  255     COAGS: Recent Labs    11/07/19 1853 11/10/19 1052 01/26/20 1050 03/31/20 1420 04/16/20 1354 10/08/20 0811  INR  --    < > 1.2 1.5* 1.0 1.1  APTT 31  --   --  37*  --   --    < > = values in this interval not displayed.     BMP: Recent Labs    01/01/20 0430 01/02/20 0427 01/03/20 0506 01/04/20 0453 01/05/20 0558 10/16/20 1920 10/17/20 0419 10/17/20 1224 10/18/20 0606  NA 143 144 143 143   < > 135 135 136 137  K 3.5 3.9 3.9 3.9   < > 3.9 4.4 4.4 3.3*  CL 116* 113* 112* 112*   < > 109 109 107 103  CO2 18* 19* 22 23   < > 17* 16* 19* 25  GLUCOSE 73 84 81 77   < > 146* 94 105* 99  BUN 60* 61* 53* 47*   < > 48* 51* 47* 25*  CALCIUM 7.3* 7.5* 7.5* 7.4*   < > 7.1* 7.3* 7.1* 7.1*  CREATININE 6.54* 6.27* 6.42* 5.74*   < > 5.93* 6.12* 5.62* 3.47*  GFRNONAA 9* 9* 9* 10*   < > 10* 10* 11* 20*  GFRAA 10* 11* 10* 12*  --   --   --   --   --    < > = values in this interval not displayed.     LIVER FUNCTION TESTS: Recent Labs    07/06/20 0456 07/07/20 0605 10/06/20 1805 10/07/20 0535 10/07/20 1719 10/16/20 1920 10/17/20 0419 10/17/20 1224 10/18/20 0606  BILITOT 0.7 0.6 0.9 1.1  --   --   --   --   --   AST 21 17 14* 16  --   --   --   --   --   ALT 14 12 54* 40  --   --   --   --   --   ALKPHOS 81 77 129* 94  --   --   --   --   --   PROT 4.4* 4.3* 7.6 5.8*  --   --   --   --   --   ALBUMIN 1.7*  1.7* 1.7*  1.6* 3.4* 2.7*   < > 1.8* 1.7* 1.7* 1.8*   < > = values in this interval not displayed.     Assessment and Plan: 55 year old male with history of Hodgkin's lymphoma, CVA, BPH with urinary retention Found to have bilateral hydronephrosis and AKI, s/p bilateral PCN placement with IR on 10/08/2020.  Now dialysis dependent moving forward.  Await further urology input regarding PCNs   Electronically Signed: Ascencion Dike, PA-C 10/18/2020, 2:12 PM   I  spent a total of 15 Minutes at the the patient's bedside AND on the patient's hospital floor or unit, greater than 50% of which was counseling/coordinating care for bilateral nephrostomy tubes

## 2020-10-18 NOTE — Progress Notes (Signed)
PROGRESS NOTE    Chase [name]  GHW:[id number] DOB: Oct 31, 1965 DOA: 10/06/2020 PCP: [name]   Brief Narrative:  Chase [name] is a 55 y.o. male with a PMH significant for CKD stage V, hx of GIB and ulcers requiring gastric bypass, prior stroke, hodgkin's lymphoma, prior MI, cervical radiculopathy, chronic pain syndrome, COVID, and PTSD who presented to the ED via EMS after being found minimally responsive on welfare check. On EMS arrival patient was found in recliner alert but confused. On chart review it appears patient was scheduled for pre op lab work 7/6 and TURP 7/7 but failed to show for either appointment. Patient has an extensive past medical history to include CKD stage 5 that progressed to acute renal failure march of this year and required CRRT. He also has indwelling catheter and bilateral nephrostomy tubes for hydronephrosis. He presents altered but in stable condition with no immediate needs for hemodialysis. Acidosis has improved with bicarb drip. Nephrology has already been consulted.  Assessment & Plan:   Principal Problem:   Acute renal failure superimposed on stage 4 chronic kidney disease (HCC) Active Problems:   BPH (benign prostatic hyperplasia)   Postlaminectomy syndrome, not elsewhere classified   PTSD (post-traumatic stress disorder)   AKI (acute kidney injury) ([place])   Anxiety and depression   Anemia in chronic kidney disease   Acute metabolic encephalopathy   Acute renal failure (ARF) (HCC)  Acute on chronic renal failure - CKD-V  Progressing to ESRD transitioning to dialysis TTS - Related to resumed worsening prostatic/obstruction given outpatient plan TURP and missed appointments/noncompliance - Status post bilateral nephrostomy tubes - previously requiring CRRT (off as of 10/09/20).  - Nephrology transitioning patient to intermittent hemodialysis - 10/16/20 - tolerated 19 cm palindrome tunneled catheter placement and L brachiobasilic  arteriovenous fistula placement well - Vascular/Nephrology following - appreciate insight and recommendations  Chronic anemia, likely of chronic disease and malnutrition - Received 2 units pRBC 7/11 - Hemoglobin minimally downtrending, 6.9 this morning, nephrology to initiate EPO -defer to their expertise on additional transfusion during dialysis or follow along with a EPO only  Failure to thrive, severe protein calorie malnutrition - Continue to encourage PO intake - liberalize diet and increase PT/OT  Hypotension:  - Related to chronic wasting and acute acidemia, improved, off pressors. Continue midodrine.  Recurrent diverticulitis with abscess formation leading to complex urological obstruction- s/p diverting ostomy with [facility] resection of redundant sigmoid colon.  Unclear if he will ever be candidate for re-anastamosis to rectal stump.  DVT prophylaxis: holding in the setting of anemia Code Status: DNR Family Communication: Patient continues to update son himself per our discussion  Status is: Inpt  Dispo: The patient is from: Home              Anticipated d/c is to: TBD - requesting DC home when possible              Anticipated d/c date is: >72h              Patient currently NOT medically stable for discharge -we will need to transition to intermittent hemodialysis, will require dialysis catheter placement as well as clipping for safe disposition.  Consultants:  Nephrology, Urology  Antimicrobials:  None   Subjective: No acute issues or events overnight; tolerated temporary dialysis catheter and fistula creation yesterday quite well -pain currently well controlled, tolerating dialysis quite well per himself.  Continue to encourage increased p.o. intake and ambulation with physical therapy given  his wishes to return home  Objective: Vitals:   10/17/20 1535 10/17/20 1724 10/17/20 2105 10/18/20 0522  BP: (!) 101/52 123/75 115/70 (!) 104/56  Pulse: (!) 55 66 70 61  Resp:  16 18 20 18   Temp: 98.2 F (36.8 C) 97.9 F (36.6 C) 99.3 F (37.4 C) 97.8 F (36.6 C)  TempSrc:  Oral Oral Oral  SpO2:  96% 97% 98%  Weight:      Height:        Intake/Output Summary (Last 24 hours) at 10/18/2020 0813 Last data filed at 10/18/2020 0700 Gross per 24 hour  Intake 525 ml  Output 2901 ml  Net -2376 ml    Filed Weights   10/11/20 1200 10/11/20 1505 10/14/20 2051  Weight: 52 kg 51 kg 51 kg    Examination:  General: Gaunt appearing gentleman pleasantly resting in bed, No acute distress. HEENT:  Normocephalic atraumatic.  Sclerae nonicteric, noninjected.  Extraocular movements intact bilaterally. Neck:  Without mass or deformity.  Trachea is midline.   Lungs:  Clear to auscultate bilaterally without rhonchi, wheeze, or rales. Heart:  Regular rate and rhythm.  Without murmurs, rubs, or gallops. Abdomen:  Soft, nontender, nondistended.  Without guarding or rebound. Nephrostomy tubes -minimal yellow urine output; ostomy in place; Foley draining clear yellow urine. Extremities: Without cyanosis, clubbing, edema, or obvious deformity.  Left upper extremity surgical site clean dry intact Vascular: Dorsalis pedis and posterior tibial pulses palpable bilaterally.  Right dialysis catheter in place, bandage clean dry intact Skin: Warm and dry, no erythema   Data Reviewed: I have personally reviewed following labs and imaging studies  CBC: Recent Labs  Lab 10/11/20 1100 10/12/20 0708 10/14/20 0605 10/16/20 0641 10/17/20 1952 10/18/20 0606  WBC 9.7 7.2 6.1 5.7  --  4.9  HGB 7.3* 7.7* 7.2* 6.9* 7.8* 7.6*  HCT 22.3* 23.4* 22.5* 22.0* 23.9* 24.2*  MCV 95.3 94.7 99.6 100.0  --  96.4  PLT 155 159 170 196  --  409    Basic Metabolic Panel: Recent Labs  Lab 10/14/20 0605 10/14/20 1620 10/15/20 0425 10/15/20 1639 10/16/20 0641 10/16/20 1920 10/17/20 0419 10/17/20 1224 10/18/20 0606  NA 134*   < > 132*   < > 137  137 135 135 136 137  K 4.0   < > 4.0   < > 4.2   4.2 3.9 4.4 4.4 3.3*  CL 106   < > 104   < > 110  111 109 109 107 103  CO2 20*   < > 20*   < > 19*  19* 17* 16* 19* 25  GLUCOSE 96   < > 95   < > 93  92 146* 94 105* 99  BUN 38*   < > 41*   < > 53*  52* 48* 51* 47* 25*  CREATININE 5.14*   < > 5.59*   < > 6.39*  6.33* 5.93* 6.12* 5.62* 3.47*  CALCIUM 6.6*   < > 6.8*  6.4*   < > 6.9*  7.0* 7.1* 7.3* 7.1* 7.1*  MG 2.2  --  2.2  --  2.4  --  2.2  --  2.1  PHOS 4.9*   < > 4.8*   < > 5.4* 5.9* 5.6* 5.0* 3.1   < > = values in this interval not displayed.    GFR: Estimated Creatinine Clearance: 17.1 mL/min (A) (by C-G formula based on SCr of 3.47 mg/dL (H)). Liver Function Tests: Recent Labs  Lab  10/16/20 0641 10/16/20 1920 10/17/20 0419 10/17/20 1224 10/18/20 0606  ALBUMIN 1.7* 1.8* 1.7* 1.7* 1.8*    No results for input(s): LIPASE, AMYLASE in the last 168 hours. No results for input(s): AMMONIA in the last 168 hours. Coagulation Profile: No results for input(s): INR, PROTIME in the last 168 hours.  Cardiac Enzymes: No results for input(s): CKTOTAL, CKMB, CKMBINDEX, TROPONINI in the last 168 hours. BNP (last 3 results) No results for input(s): PROBNP in the last 8760 hours. HbA1C: No results for input(s): HGBA1C in the last 72 hours. CBG: Recent Labs  Lab 10/14/20 1648 10/15/20 1139 10/15/20 1731  GLUCAP 116* 106* 115*    Lipid Profile: No results for input(s): CHOL, HDL, LDLCALC, TRIG, CHOLHDL, LDLDIRECT in the last 72 hours. Thyroid Function Tests: No results for input(s): TSH, T4TOTAL, FREET4, T3FREE, THYROIDAB in the last 72 hours. Anemia Panel: No results for input(s): VITAMINB12, FOLATE, FERRITIN, TIBC, IRON, RETICCTPCT in the last 72 hours. Sepsis Labs: No results for input(s): PROCALCITON, LATICACIDVEN in the last 168 hours.   Recent Results (from the past 240 hour(s))  Aerobic/Anaerobic Culture w Gram Stain (surgical/deep wound)     Status: None   Collection Time: 10/08/20 12:02 PM   Specimen:  Urine, Random  Result Value Ref Range Status   Specimen Description URINE, RANDOM  Final   Special Requests LEFT KIDNEY  Final   Gram Stain   Final    ABUNDANT WBC PRESENT,BOTH PMN AND MONONUCLEAR ABUNDANT GRAM VARIABLE ROD    Culture   Final    ABUNDANT ESCHERICHIA COLI ABUNDANT PSEUDOMONAS AERUGINOSA ABUNDANT ENTEROCOCCUS FAECIUM NO ANAEROBES ISOLATED Performed at [facility], [address], [place], [place] [address]    Report Status 10/14/2020 FINAL  Final   Organism ID, Bacteria ESCHERICHIA COLI  Final   Organism ID, Bacteria ENTEROCOCCUS FAECIUM  Final   Organism ID, Bacteria PSEUDOMONAS AERUGINOSA  Final      Susceptibility   Escherichia coli - MIC*    AMPICILLIN <=2 SENSITIVE Sensitive     CEFAZOLIN <=4 SENSITIVE Sensitive     CEFEPIME <=0.12 SENSITIVE Sensitive     CEFTRIAXONE <=0.25 SENSITIVE Sensitive     CIPROFLOXACIN <=0.25 SENSITIVE Sensitive     GENTAMICIN <=1 SENSITIVE Sensitive     IMIPENEM <=0.25 SENSITIVE Sensitive     NITROFURANTOIN <=16 SENSITIVE Sensitive     TRIMETH/SULFA <=20 SENSITIVE Sensitive     AMPICILLIN/SULBACTAM <=2 SENSITIVE Sensitive     PIP/TAZO <=4 SENSITIVE Sensitive     * ABUNDANT ESCHERICHIA COLI   Enterococcus faecium - MIC*    AMPICILLIN >=32 RESISTANT Resistant     NITROFURANTOIN <=16 SENSITIVE Sensitive     VANCOMYCIN <=0.5 SENSITIVE Sensitive     * ABUNDANT ENTEROCOCCUS FAECIUM   Pseudomonas aeruginosa - MIC*    CEFTAZIDIME 4 SENSITIVE Sensitive     CIPROFLOXACIN <=0.25 SENSITIVE Sensitive     GENTAMICIN <=1 SENSITIVE Sensitive     IMIPENEM 8 INTERMEDIATE Intermediate     PIP/TAZO <=4 SENSITIVE Sensitive     CEFEPIME 2 SENSITIVE Sensitive     * ABUNDANT PSEUDOMONAS AERUGINOSA  Surgical pcr screen     Status: None   Collection Time: 10/15/20  9:40 PM   Specimen: Nasal Mucosa; Nasal Swab  Result Value Ref Range Status   MRSA, PCR NEGATIVE NEGATIVE Final   Staphylococcus aureus NEGATIVE NEGATIVE Final    Comment:  (NOTE) The Xpert SA Assay (FDA approved for NASAL specimens in patients 68 years of age and older),  is one component of a comprehensive surveillance program. It is not intended to diagnose infection nor to guide or monitor treatment. Performed at [facility], [place] [address], [place], [place] [address]      Radiology Studies: DG Chest Port 1 View  Result Date: 10/16/2020 CLINICAL DATA:  Status post dialysis catheter placement EXAM: PORTABLE CHEST 1 VIEW COMPARISON:  10/08/2020 FINDINGS: Previously seen left jugular central line is been removed. New right jugular dialysis catheter is noted in satisfactory position in the distal superior vena cava. No pneumothorax is noted. Elevation the right hemidiaphragm is noted with some compressive atelectasis. Left lung is clear. IMPRESSION: No evidence of pneumothorax following catheter placement. Electronically Signed   By: Inez Catalina M.D.   On: 10/16/2020 16:01   HYBRID OR IMAGING ([facility] ONLY)  Result Date: 10/16/2020 There is no interpretation for this exam.  This order is for images obtained during a surgical procedure.  Please See "Surgeries" Tab for more information regarding the procedure.    Scheduled Meds:  sodium chloride   Intravenous Once   calcium carbonate  2 tablet Oral TID WC   Chlorhexidine Gluconate Cloth  6 each Topical Daily   Chlorhexidine Gluconate Cloth  6 each Topical Q0600   [START ON 10/20/2020] darbepoetin (ARANESP) injection - DIALYSIS  200 mcg Subcutaneous Q7 days   mouth rinse  15 mL Mouth Rinse BID   midodrine  10 mg Oral Q T,Th,Sa-HD   multivitamin with minerals  1 tablet Oral BID   pantoprazole  40 mg Oral Q1200   sodium chloride flush  5 mL Intracatheter Q8H   Continuous Infusions:  sodium chloride 250 mL (10/09/20 1732)   norepinephrine (LEVOPHED) Adult infusion Stopped (10/08/20 0401)     LOS: 12 days   Time spent: 30 min  Little Ishikawa, DO [facility]  If 7PM-7AM, please contact  night-coverage [email or web address]  10/18/2020, 8:13 AM

## 2020-10-18 NOTE — Progress Notes (Signed)
[facility] Progress Note  Name: Chase [name] MRN: [id number] DOB: 1965/04/29    Subjective:  S/p HD yest-  not much uf by design-  4010 of UOP.  No new issues-  disposition is complicated -  is sleeping and because has been so miserable I let him sleep    ------------- Background on referral:  55 y.o. 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  He was supposed to have a TURP  but didn't show up for procedure or pre-op labs.  He was then found to be unresponsive on a welfare check.  He has an indwelling foley.  Nephrology consulted.    We last saw him in 01/02/20 inpatient and he was lost to follow-up with [facility] after that hospitalization after missing multiple appointments.  He did also not establish care with nephrology at the [facility].  He has had interim hospitalizations including one with CRRT.  At a SNF at one point but most recently at home.     Dr. Royce Macadamia had an extensive conversation with the patient, his son (present via phone), and his nurse.  We discussed the risks/benefits/indications of dialysis/RRT.  They do wish to pursue dialysis and consent for renal replacement therapy.  The patient wishes it hadn't come to this but is not ready to transition to comfort care.  He wants to live independently but his son discussed that that hadn't been going well.   Intake/Output Summary (Last 24 hours) at 10/18/2020 1140 Last data filed at 10/18/2020 0800 Gross per 24 hour  Intake 765 ml  Output 2301 ml  Net -1536 ml    Vitals:  Vitals:   10/17/20 1724 10/17/20 2105 10/18/20 0522 10/18/20 0852  BP: 123/75 115/70 (!) 104/56 (!) 101/59  Pulse: 66 70 61 78  Resp: 18 20 18 18   Temp: 97.9 F (36.6 C) 99.3 F (37.4 C) 97.8 F (36.6 C) 98 F (36.7 C)  TempSrc: Oral Oral Oral Oral  SpO2: 96% 97% 98% 98%  Weight:      Height:         Physical Exam:  General: thin chronically ill appearing  gentleman  HEENT: [place] AT Eyes: EOMI, anicteric Neck: supple trachea midline Heart: RRR on monitor Lungs: normal WOB on RA Extremities: no edema  GU foley in place Access: - new TDC and left upper arm AVF wit thrill and bruit  Medications reviewed   Labs:  BMP Latest Ref Rng & Units 10/18/2020 10/17/2020 10/17/2020  Glucose 70 - 99 mg/dL 99 105(H) 94  BUN 6 - 20 mg/dL 25(H) 47(H) 51(H)  Creatinine 0.61 - 1.24 mg/dL 3.47(H) 5.62(H) 6.12(H)  Sodium 135 - 145 mmol/L 137 136 135  Potassium 3.5 - 5.1 mmol/L 3.3(L) 4.4 4.4  Chloride 98 - 111 mmol/L 103 107 109  CO2 22 - 32 mmol/L 25 19(L) 16(L)  Calcium 8.9 - 10.3 mg/dL 7.1(L) 7.1(L) 7.3(L)     Assessment/Plan:   # 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  Presented with severe AKI requiring CRRT 7/9. Had BL hydronephrosis despite foley and ureteral stents so underwent nephrostomy tube placement as well.  Despite decompression with good UOP 24h CrCl 3 consistent with ESRD.  - Off CRRT 7/11, had HD for clearance 7/13; declined HD 7/16 despite expressing desire for longterm HD.  Agreeable to HD Today and ongoing TIW-  TTS schedule-  next due 7/21 -- Appreicate [facility] consult re: perm access and TDC placement- has been done -Outpt CLIP underway -  he wants to do dialysis at the [facility] in [place]-  have told them    # Chronic urinary retention: missed scheduled TURP per charting - s/p BL nephrostomy tubes this admission - plan per urology for nephrostomy tubes --> given committed to dialysis now plan will be maintain comfort and avoid infection   # Hx CVA - left hemiparesis - previously at SNF and noted he presented from home most recently   # Anemia - normocytic.  Has rec'd 2u pRBC.   Started ESA 7/11. Will increase dose-  s/p transfusion 7/19 with HD  #BMM: PTH 64 , corr ca a bit low - supplement, phos ok.    Insists upon return home vs SNF - concern expressed about  feasibility of plan but he insists with home health and [facility] transport to dialysis in [place].  Will change labs to only with HD and await a discharge plan   Louis Meckel, MD 10/18/2020 11:40 AM

## 2020-10-18 NOTE — Progress Notes (Signed)
Palliative: Chase [name] is lying quietly in bed.  He appears acutely/chronically ill and very frail.  He will briefly make but not keep eye contact.  He is alert and oriented, able to make his needs known.  There is no family at bedside at this time.  Chase [name] tells me that he is doing okay with hemodialysis.  He has no issues or concerns related to dialysis.  We talked about outpatient dialysis.  I share that I read that he is interested in doing dialysis at the [facility] in [place].  He tells me that he is open to dialysis at any location, but they had asked for a preference.  At this point transition of care team is working for outpatient HD seat.  Chase [name] tells me that he is ready for his pain medicine.  Nursing staff updated.   Conference with attending, bedside nursing staff, transition of care team related to patient condition, needs, goals of care. PMT to shadow for needs.  Plan: Continue to treat the treatable but no CPR or intubation.  Continue hemodialysis and other treatments as offered.  Declines outpatient palliative services.  25 minutes  [name], NP Palliative medicine team Team phone [phone] Greater than 50% of this time spent counseling and coordinating care related to the above assessment and plan.

## 2020-10-18 NOTE — Progress Notes (Signed)
Patient ID: Chase [name], male   DOB: 1965/12/11, 55 y.o.   MRN: [id number]   2 Days Post-Op Subjective: [name] is now determined to be dialysis-dependent.  He is fatigued but denies pain.  Objective: Vital signs in last 24 hours: Temp:  [97.8 F (36.6 C)-99.3 F (37.4 C)] 98 F (36.7 C) (07/20 0852) Pulse Rate:  [61-78] 78 (07/20 0852) Resp:  [18-20] 18 (07/20 0852) BP: (101-115)/(56-70) 101/59 (07/20 0852) SpO2:  [97 %-98 %] 98 % (07/20 0852)  Intake/Output from previous day: 07/19 0701 - 07/20 0700 In: 525 [P.O.:240; Blood:285] Out: 2901 [Urine:2300; Stool:100] Intake/Output this shift: No intake/output data recorded.  Physical Exam:  General: Alert and oriented GU: B PCNs in place and draining clear urine.  Lab Results: Recent Labs    10/16/20 0641 10/17/20 1952 10/18/20 0606  HGB 6.9* 7.8* 7.6*  HCT 22.0* 23.9* 24.2*   BMET Recent Labs    10/17/20 1224 10/18/20 0606  NA 136 137  K 4.4 3.3*  CL 107 103  CO2 19* 25  GLUCOSE 105* 99  BUN 47* 25*  CREATININE 5.62* 3.47*  CALCIUM 7.1* 7.1*     Studies/Results: No results found.  Assessment/Plan: 1) Bilateral hydronephrosis: Unclear etiology and unclear whether his hydronephrosis is truly obstructive.  He has indwelling bilateral ureteral stents but had bilateral nephrostomy tube placement on admission to optimize chance for renal function recovery.  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  As such, I would recommend a trial of bilateral nephrostomy tube clamping (he was previously noted to have adequate drainage with ureteral stents alone).  If UOP remains reasonably similar to current UOP, may be able to remove nephrostomy tubes.  If he fails this trial, it may be prudent for me to change his ureteral stents prior to  discharge to attempt another trial of clamping with new stents.  Will try to make as much progress as possible during his hospitalization with these goals.   2) Bladder outlet obstruction:  He was scheduled for TURP prior to this admission and still may benefit from this to relieve bladder outlet obstruction and eventually to be rid of his urethral catheter. This will now need to delayed until he is medically stable.  His catheter was changed on 10/08/20.  He has an appt with me on 7/26 currently although I can adjust this as needed to early August if more appropriate depending on when he is discharged from the hospital.    LOS: 12 days   Chase [name] 10/18/2020, 7:13 PM

## 2020-10-18 NOTE — Plan of Care (Signed)
  Problem: Nutrition: Goal: Adequate nutrition will be maintained Outcome: Completed/Met   Problem: Coping: Goal: Level of anxiety will decrease Outcome: Completed/Met   

## 2020-10-18 NOTE — Progress Notes (Signed)
Physical Therapy Treatment Patient Details Name: Chase [name] MRN: [id number] DOB: [birth year] Today's Date: 10/18/2020    History of Present Illness Pt is 55 yo male who presented to the ED via EMS after being found minimally responsive during a welfare check. PMHx significant for CKD stage IV, hx of GIB and ulcers requiring gastric bypass, Hx of CVA, hodgkin's lymphoma, prior MI, cervical radiculopathy, chronic pain syndrome, COVID, and PTSD.    PT Comments    Pt supine in bed on entry, requesting IV fentanyl despite getting oral pain meds 30 min prior in prep for PT session. Pt with improved mobility, however continues to be self limiting. Pt is min Ax2 for bed mobility and for 5 x sit>stand in [place]. Pt with c/o of dizziness with last bout of standing. Dizziness subsided by the time pt back in supine. Continue to feel pt would benefit from SNF level rehab. PT will continue to follow acutely.    Follow Up Recommendations  Home health PT;Other (comment);Supervision - Intermittent (pt refuses SNF, encouraging HHAide through [place] however pt wants some days to his self.)     Equipment Recommendations  None recommended by PT (has necessary equipment)       Precautions / Restrictions Precautions Precautions: Fall Restrictions Weight Bearing Restrictions: No    Mobility  Bed Mobility Overal bed mobility: Needs Assistance Bed Mobility: Supine to Sit;Sit to Supine     Supine to sit: Min assist;+2 for physical assistance Sit to supine: Min assist;+2 for physical assistance   General bed mobility comments: with HoB elevated pt able to come to EoB with minAx2 and cuing for hand placement on R bedrail to pull himself around, min Ax2 for managing LE into bed,    Transfers Overall transfer level: Needs assistance   Transfers: Sit to/from Stand Sit to Stand: Min guard         General transfer comment: min guard for power up and steadying at [employer] Overall  balance assessment: Needs assistance Sitting-balance support: Feet supported;Bilateral upper extremity supported Sitting balance-Leahy Scale: Poor Sitting balance - Comments: requires light outside support, able to sit for 5 minutes before returning to bed   Standing balance support: Bilateral upper extremity supported Standing balance-Leahy Scale: Fair Standing balance comment: using Stedy bar                            Cognition Arousal/Alertness: Awake/alert Behavior During Therapy: Flat affect;WFL for tasks assessed/performed Overall Cognitive Status: Within Functional Limits for tasks assessed                                 General Comments: pt continues to ask      Exercises Other Exercises Other Exercises: 5x sit>stand in [place] with as long as 2 minutes of static stand    General Comments General comments (skin integrity, edema, etc.): VSS on RA, slight dizziness with 5th bout of standing returned to bed and symptoms subsided      Pertinent Vitals/Pain Pain Assessment: Faces Pain Score: 8  Faces Pain Scale: Hurts a little bit Pain Location: kidneys, back Pain Descriptors / Indicators: Discomfort;Grimacing Pain Intervention(s): Limited activity within patient's tolerance;Monitored during session;Repositioned;Patient requesting pain meds-RN notified     PT Goals (current goals can now be found in the care plan section) Acute Rehab PT Goals Patient Stated Goal: to  go home from the hospital PT Goal Formulation: With patient Time For Goal Achievement: 10/26/20 Potential to Achieve Goals: Poor Progress towards PT goals: Progressing toward goals    Frequency    Min 3X/week      PT Plan Current plan remains appropriate    Co-evaluation              AM-PAC PT "6 Clicks" Mobility   Outcome Measure  Help needed turning from your back to your side while in a flat bed without using bedrails?: Total Help needed moving from lying on  your back to sitting on the side of a flat bed without using bedrails?: Total Help needed moving to and from a bed to a chair (including a wheelchair)?: Total Help needed standing up from a chair using your arms (e.g., wheelchair or bedside chair)?: Total Help needed to walk in hospital room?: Total Help needed climbing 3-5 steps with a railing? : Total 6 Click Score: 6    End of Session   Activity Tolerance: Patient tolerated treatment well Patient left: in bed;with call bell/phone within reach;with bed alarm set Nurse Communication: Mobility status;Patient requests pain meds PT Visit Diagnosis: Muscle weakness (generalized) (M62.81);Repeated falls (R29.6);History of falling (Z91.81);Unsteadiness on feet (R26.81);Other abnormalities of gait and mobility (R26.89);Difficulty in walking, not elsewhere classified (R26.2);Hemiplegia and hemiparesis;Pain Hemiplegia - Right/Left: Left Hemiplegia - dominant/non-dominant: Non-dominant Hemiplegia - caused by: Cerebral infarction Pain - Right/Left:  (bilateral) Pain - part of body:  (kidneys and back)     Time: 3832-9191 PT Time Calculation (min) (ACUTE ONLY): 19 min  Charges:  $Therapeutic Exercise: 8-22 mins                     Kingslee Dowse B. Migdalia Dk PT, DPT Acute Rehabilitation Services Pager [phone] Office [phone]    Juana Diaz 10/18/2020, 2:08 PM

## 2020-10-18 NOTE — TOC Progression Note (Signed)
Transition of Care [facility]) - Progression Note    Patient Details  Name: MARTESE VANATTA MRN: [id number] Date of Birth: 06-Jun-1965  Transition of Care [facility]) CM/SW [place], [place] Phone Number: 10/18/2020, 10:33 AM  Clinical Narrative:     Spoke with renal navigator. Clip still in process.   Expected Discharge Plan: [place] Barriers to Discharge: Continued Medical Work up  Expected Discharge Plan and Services Expected Discharge Plan: [place]   Discharge Planning Services: CM Consult Post Acute Care Choice: [place] arrangements for the past 2 months: Single Family Home                           HH Arranged: RN, PT, OT, Nurse's Aide HH Agency: [place] Date [facility] Agency Contacted: 10/12/20 Time [place]: 1200 Representative spoke with at [place]: [place] ([place]) Interventions    Readmission Risk Interventions Readmission Risk Prevention Plan 07/04/2020 12/25/2019 11/19/2019  Transportation Screening Complete Complete Complete  Medication Review Press photographer) Complete Complete Complete  PCP or Specialist appointment within 3-5 days of discharge Complete - Complete  HRI or Home Care Consult Complete - Complete  SW Recovery Care/Counseling Consult Complete - Complete  Palliative Care Screening Not Applicable - Not Applicable  Skilled Nursing Facility Not Applicable - Complete  Some recent data might be hidden

## 2020-10-19 DIAGNOSIS — Z7189 Other specified counseling: Secondary | ICD-10-CM | POA: Diagnosis not present

## 2020-10-19 DIAGNOSIS — N179 Acute kidney failure, unspecified: Secondary | ICD-10-CM | POA: Diagnosis not present

## 2020-10-19 DIAGNOSIS — L8990 Pressure ulcer of unspecified site, unspecified stage: Secondary | ICD-10-CM

## 2020-10-19 DIAGNOSIS — G9341 Metabolic encephalopathy: Secondary | ICD-10-CM | POA: Diagnosis not present

## 2020-10-19 DIAGNOSIS — Z515 Encounter for palliative care: Secondary | ICD-10-CM | POA: Diagnosis not present

## 2020-10-19 LAB — CBC
HCT: 23.8 % — ABNORMAL LOW (ref 39.0–52.0)
Hemoglobin: 7.7 g/dL — ABNORMAL LOW (ref 13.0–17.0)
MCH: 31.4 pg (ref 26.0–34.0)
MCHC: 32.4 g/dL (ref 30.0–36.0)
MCV: 97.1 fL (ref 80.0–100.0)
Platelets: 247 10*3/uL (ref 150–400)
RBC: 2.45 MIL/uL — ABNORMAL LOW (ref 4.22–5.81)
RDW: 15.3 % (ref 11.5–15.5)
WBC: 5.2 10*3/uL (ref 4.0–10.5)
nRBC: 0 % (ref 0.0–0.2)

## 2020-10-19 LAB — BASIC METABOLIC PANEL
Anion gap: 8 (ref 5–15)
BUN: 40 mg/dL — ABNORMAL HIGH (ref 6–20)
CO2: 21 mmol/L — ABNORMAL LOW (ref 22–32)
Calcium: 7 mg/dL — ABNORMAL LOW (ref 8.9–10.3)
Chloride: 108 mmol/L (ref 98–111)
Creatinine, Ser: 4.64 mg/dL — ABNORMAL HIGH (ref 0.61–1.24)
GFR, Estimated: 14 mL/min — ABNORMAL LOW (ref >60)
Glucose, Bld: 101 mg/dL — ABNORMAL HIGH (ref 70–99)
Potassium: 2.9 mmol/L — ABNORMAL LOW (ref 3.5–5.1)
Sodium: 137 mmol/L (ref 135–145)

## 2020-10-19 MED ORDER — FENTANYL CITRATE (PF) 100 MCG/2ML IJ SOLN
12.5000 ug | INTRAMUSCULAR | Status: DC | PRN
  Administered 2020-10-20 (×3): 12.5 ug via INTRAVENOUS
  Filled 2020-10-19 (×3): qty 2

## 2020-10-19 NOTE — Progress Notes (Signed)
Occupational Therapy Treatment Patient Details Name: Chase [name] MRN: [id number] DOB: 1966/01/02 Today's Date: 10/19/2020    History of present illness Pt is 55 yo male who presented to the ED via EMS after being found minimally responsive during a welfare check. PMHx significant for CKD stage IV, hx of GIB and ulcers requiring gastric bypass, Hx of CVA, hodgkin's lymphoma, prior MI, cervical radiculopathy, chronic pain syndrome, COVID, and PTSD.   OT comments  Pt progressing slowly towards acute OT goals. Focus of session was transferring into recliner to work on building OOB activity tolerance especially pt's goal of OP HD. Pt agreeable to mobilize once nausea med given and well medicated for pain. Pt in recliner at end of session, chair alarm on. D/c plan remains appropriate.    Follow Up Recommendations  SNF;Supervision/Assistance - 24 hour;Other (comment) (noted that pt is currently refusing SNF. If he goes home will need to maximize home health services [facility] RN, PT, OT, aide))    Equipment Recommendations  3 in 1 bedside commode    Recommendations for Other Services      Precautions / Restrictions Precautions Precautions: Fall Restrictions Weight Bearing Restrictions: No       Mobility Bed Mobility Overal bed mobility: Needs Assistance Bed Mobility: Sidelying to Sit;Rolling Rolling: Max assist         General bed mobility comments: pt rolled to side d/t back pain. HOB partially elevated. Mod A for trunk elevation to come sidelying to EOB. Pt utilized bed rails.    Transfers Overall transfer level: Needs assistance   Transfers: Sit to/from Stand Sit to Stand: Min guard         General transfer comment: min guard for power up and steadying at [facility] bar. 1x from EOB, 1x from recliner    Balance Overall balance assessment: Needs assistance Sitting-balance support: Feet supported;Bilateral upper extremity supported Sitting balance-Leahy Scale: Poor Sitting  balance - Comments: BUE support for static sitting. Trunk flexed position.   Standing balance support: Bilateral upper extremity supported Standing balance-Leahy Scale: Fair Standing balance comment: using Stedy bar                           ADL either performed or assessed with clinical judgement   ADL Overall ADL's : Needs assistance/impaired Eating/Feeding: Set up;Modified independent                       Toilet Transfer: Minimal assistance;+2 for physical assistance Toilet Transfer Details (indicate cue type and reason): Stedy           General ADL Comments: Completed bed mobility, sat EOB several minutes.     Vision       Perception     Praxis      Cognition Arousal/Alertness: Awake/alert Behavior During Therapy: Flat affect;WFL for tasks assessed/performed Overall Cognitive Status: Within Functional Limits for tasks assessed                                          Exercises     Shoulder Instructions       General Comments first attempt pt nauseous, awaiting nausea meds. Second attempt pt requesting pain meds prior to mobilizing    Pertinent Vitals/ Pain       Pain Assessment: 0-10 Pain Score: 9  Pain Location: all over, neck,  back Pain Descriptors / Indicators: Discomfort;Grimacing Pain Intervention(s): Monitored during session;Limited activity within patient's tolerance;Patient requesting pain meds-RN notified;Premedicated before session;Repositioned  Home Living                                          Prior Functioning/Environment              Frequency  Min 2X/week        Progress Toward Goals  OT Goals(current goals can now be found in the care plan section)  Progress towards OT goals: Progressing toward goals (slow progression)  Acute Rehab OT Goals Patient Stated Goal: to go home from the hospital OT Goal Formulation: With patient Time For Goal Achievement:  10/25/20 Potential to Achieve Goals: Fair ADL Goals Pt Will Perform Grooming: with set-up;with supervision;sitting Pt Will Perform Upper Body Dressing: with set-up;with supervision;sitting Pt Will Perform Lower Body Dressing: with min assist;sit to/from stand;with min guard assist;sitting/lateral leans Pt Will Transfer to Toilet: with min guard assist;stand pivot transfer;ambulating;bedside commode Pt/caregiver will Perform Home Exercise Program: Increased strength;Both right and left upper extremity;With Supervision;With minimal assist Additional ADL Goal #1: Pt will complete bed mobility at min A level to prepare for EOB/OOB ADLs.  Plan Discharge plan remains appropriate;Frequency remains appropriate    Co-evaluation                 AM-PAC OT "6 Clicks" Daily Activity     Outcome Measure   Help from another person eating meals?: A Little Help from another person taking care of personal grooming?: A Little Help from another person toileting, which includes using toliet, bedpan, or urinal?: A Lot Help from another person bathing (including washing, rinsing, drying)?: A Lot Help from another person to put on and taking off regular upper body clothing?: A Lot Help from another person to put on and taking off regular lower body clothing?: A Lot 6 Click Score: 14    End of Session Equipment Utilized During Treatment: Other (comment) Charlaine Dalton)  OT Visit Diagnosis: Unsteadiness on feet (R26.81);Muscle weakness (generalized) (M62.81);Pain;Adult, failure to thrive (R62.7);History of falling (Z91.81)   Activity Tolerance Other (comment);Patient limited by fatigue;Patient limited by pain (premedicated for nausea and pain)   Patient Left in chair;with call bell/phone within reach;with chair alarm set   Nurse Communication Other (comment);Patient requests pain meds;Need for lift equipment (NT present throughout session. RN gave pain med)        Time: 1030-1053 OT Time Calculation  (min): 23 min  Charges: OT General Charges $OT Visit: 1 Visit OT Treatments $Self Care/Home Management : 23-37 mins  Tyrone Schimke, OT Acute Rehabilitation Services Pager: [phone] Office: [phone]    Hortencia Pilar 10/19/2020, 12:19 PM

## 2020-10-19 NOTE — Progress Notes (Signed)
[facility] Progress Note  Name: Chase [name] MRN: [id number] DOB: 1965-11-22    Subjective: Appreciate urology input on neph tubes considering removing or internalizing prior to discharge -  planning on HD later today    ------------- Background on referral:  55 y.o. 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  He was supposed to have a TURP  but didn't show up for procedure or pre-op labs.  He was then found to be unresponsive on a welfare check.  He has an indwelling foley.  Nephrology consulted.    We last saw him in 01/02/20 inpatient and he was lost to follow-up with [facility] after that hospitalization after missing multiple appointments.  He did also not establish care with nephrology at [facility].  He has had interim hospitalizations including one with CRRT.  At a SNF at one point but most recently at home.     Dr. Royce Macadamia had an extensive conversation with the patient, his son (present via phone), and his nurse.  We discussed the risks/benefits/indications of dialysis/RRT.  They do wish to pursue dialysis and consent for renal replacement therapy.  The patient wishes it hadn't come to this but is not ready to transition to comfort care.  He wants to live independently but his son discussed that that hadn't been going well.   Intake/Output Summary (Last 24 hours) at 10/19/2020 1037 Last data filed at 10/19/2020 0900 Gross per 24 hour  Intake 900 ml  Output 2500 ml  Net -1600 ml    Vitals:  Vitals:   10/18/20 0852 10/18/20 2050 10/19/20 0439 10/19/20 0917  BP: (!) 101/59 107/71 108/60 103/68  Pulse: 78 64 75 74  Resp: 18 18 18 18   Temp: 98 F (36.7 C) 98.3 F (36.8 C) 98.3 F (36.8 C) 97.9 F (36.6 C)  TempSrc: Oral   Oral  SpO2: 98% 99% 95% 94%  Weight:      Height:         Physical Exam:  General: thin chronically ill appearing gentleman  HEENT: [place] AT Eyes: EOMI,  anicteric Neck: supple trachea midline Heart: RRR on monitor Lungs: normal WOB on RA Extremities: no edema  GU foley in place Access: - new TDC and left upper arm AVF wit thrill and bruit  Medications reviewed   Labs:  BMP Latest Ref Rng & Units 10/19/2020 10/18/2020 10/17/2020  Glucose 70 - 99 mg/dL 101(H) 99 105(H)  BUN 6 - 20 mg/dL 40(H) 25(H) 47(H)  Creatinine 0.61 - 1.24 mg/dL 4.64(H) 3.47(H) 5.62(H)  Sodium 135 - 145 mmol/L 137 137 136  Potassium 3.5 - 5.1 mmol/L 2.9(L) 3.3(L) 4.4  Chloride 98 - 111 mmol/L 108 103 107  CO2 22 - 32 mmol/L 21(L) 25 19(L)  Calcium 8.9 - 10.3 mg/dL 7.0(L) 7.1(L) 7.1(L)     Assessment/Plan:   # 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  Presented with severe AKI requiring CRRT 7/9. Had BL hydronephrosis despite foley and ureteral stents so underwent nephrostomy tube placement as well.  Despite decompression with good UOP 24h CrCl 3 consistent with ESRD.  - Off CRRT 7/11, had HD for clearance 7/13; declined HD 7/16 despite expressing desire for longterm HD.  Agreeable to HD Today and ongoing TIW-  TTS schedule-  next due today 7/21-  may need to be bumped due to dialysis staffing FYI --  Appreicate VVS consult re: perm access and TDC  has been done- new left upper arm AVF on 7/18 -Outpt CLIP underway -  he wants to do dialysis at the [place] in [place]-  have told them    # Chronic urinary retention: missed scheduled TURP per charting - s/p BL nephrostomy tubes this admission - plan per urology for nephrostomy tubes --> given committed to dialysis now plan will be maintain comfort and avoid infection   # Hx CVA - left hemiparesis - previously at SNF and noted he presented from home most recently   # Anemia - normocytic.  Has rec'd 2u pRBC.   Started ESA 7/11. have increased dose-  s/p transfusion 7/19 with HD  #BMM: PTH 64 , corr ca a bit low - supplement, phos ok no binder.     Insists upon return home vs SNF - concern expressed about feasibility of plan but he insists with home health and [facility] transport to dialysis in [place]-  renal navigator is aware but we have not heard of OP HD spot yet.  Will change labs to only with HD and await a discharge plan   Louis Meckel, MD 10/19/2020 10:38 AM

## 2020-10-19 NOTE — Progress Notes (Signed)
Pt hemodialysis tx move to tomorrow first shift per Dr. Moshe Cipro ordered. Jason Fila RN notified.

## 2020-10-19 NOTE — TOC Progression Note (Signed)
Transition of Care [facility]) - Progression Note    Patient Details  Name: Chase [name] MRN: [id number] Date of Birth: 03-Nov-1965  Transition of Care [facility]) CM/SW [place], [place] Phone Number: 10/19/2020, 1:53 PM  Clinical Narrative:     CSW received call from Ernst Breach at [place] ext [phone]. She explained she was notified that pt wants dialysis at [facility] not [facility]. CSW called renal navigator Marya Amsler who confirmed he was notified of this as well.   CSW met with pt and confirmed that pt wants to do dialysis at [place]. He uses [facility] transportation which is already approved. CSW calls Anderson Malta and puts her on speaker phone with pt. She is going to check on MWF Afternoon chair times at [facility] HD clinic. She states Earliest chair time would be next week. She will follow up with CSW tomorrow.   Expected Discharge Plan: [place] Barriers to Discharge: Continued Medical Work up  Expected Discharge Plan and Services Expected Discharge Plan: [place]   Discharge Planning Services: CM Consult Post Acute Care Choice: [place] arrangements for the past 2 months: Single Family Home                           HH Arranged: RN, PT, OT, Nurse's Aide HH Agency: [place] Date [facility] Agency Contacted: 10/12/20 Time [place]: 1200 Representative spoke with at [place]: [place] ([place]) Interventions    Readmission Risk Interventions Readmission Risk Prevention Plan 07/04/2020 12/25/2019 11/19/2019  Transportation Screening Complete Complete Complete  Medication Review Press photographer) Complete Complete Complete  PCP or Specialist appointment within 3-5 days of discharge Complete - Complete  HRI or Home Care Consult Complete - Complete  SW Recovery Care/Counseling Consult Complete - Complete  Palliative Care Screening Not Applicable - Not Applicable  Skilled Nursing Facility  Not Applicable - Complete  Some recent data might be hidden

## 2020-10-19 NOTE — Progress Notes (Signed)
PROGRESS NOTE    Chase [name]  ION:[id number] DOB: 07/01/1965 DOA: 10/06/2020 PCP: [name]   Brief Narrative:  Chase [name] is a 55 y.o. male with a PMH significant for CKD stage V, hx of GIB and ulcers requiring gastric bypass, prior stroke, hodgkin's lymphoma, prior MI, cervical radiculopathy, chronic pain syndrome, COVID, and PTSD who presented to the ED via EMS after being found minimally responsive on welfare check. On EMS arrival patient was found in recliner alert but confused. On chart review it appears patient was scheduled for pre op lab work 7/6 and TURP 7/7 but failed to show for either appointment. Patient has an extensive past medical history to include CKD stage 5 that progressed to acute renal failure march of this year and required CRRT. He also has indwelling catheter and bilateral nephrostomy tubes for hydronephrosis. He presents altered but in stable condition with no immediate needs for hemodialysis. Acidosis has improved with bicarb drip. Nephrology has already been consulted.  Assessment & Plan:   Principal Problem:   Acute renal failure superimposed on stage 4 chronic kidney disease (HCC) Active Problems:   BPH (benign prostatic hyperplasia)   Postlaminectomy syndrome, not elsewhere classified   PTSD (post-traumatic stress disorder)   AKI (acute kidney injury) ([place])   Anxiety and depression   Anemia in chronic kidney disease   Acute metabolic encephalopathy   Acute renal failure (ARF) (HCC)  Acute on chronic renal failure - CKD-V  Progressing to ESRD transitioning to dialysis TTS - Related to resumed worsening prostatic/obstruction given outpatient plan TURP and missed appointments/noncompliance - Status post bilateral nephrostomy tubes - previously requiring CRRT (off as of 10/09/20).  - Nephrology transitioning patient to intermittent hemodialysis - 10/16/20 - tolerated 19 cm palindrome tunneled catheter placement and L brachiobasilic  arteriovenous fistula placement well - Vascular/Nephrology/Urology following - appreciate insight and recommendations; Urology recommending trial of bilateral nephrostomy tube clamping  Chronic anemia, likely of chronic disease and malnutrition - Received 2 units pRBC 7/11 - Hemoglobin minimally downtrending, 6.9 this morning, nephrology to initiate EPO -defer to their expertise on additional transfusion during dialysis or follow along with a EPO only  Failure to thrive, severe protein calorie malnutrition - Continue to encourage PO intake - liberalize diet and increase PT/OT  Hypotension:  - Related to chronic wasting and acute acidemia, improved, off pressors. Continue midodrine.  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 resection of redundant sigmoid colon.  Unclear if he will ever be candidate for re-anastamosis to rectal stump.  DVT prophylaxis: holding in the setting of anemia [name] Status: DNR Family Communication: Patient continues to update son himself per our discussion  Status is: Inpt  Dispo: The patient is from: Home              Anticipated d/c is to: TBD - requesting DC home when possible - needs further evaluation with urology, nephrology and will need clip for dialysis slot before DC              Anticipated d/c date is: >72h              Patient currently NOT medically stable for discharge  Consultants:  Nephrology, Urology  Antimicrobials:  None   Subjective: No acute issues or events overnight; tolerated temporary dialysis catheter and fistula creation quite well -pain currently well controlled, tolerating dialysis without any issues. Continue to encourage increased p.o. intake and ambulation with physical therapy  given his wishes to return home  Objective: Vitals:   10/18/20 0522 10/18/20 0852 10/18/20 2050 10/19/20 0439  BP: (!) 104/56 (!) 101/59 107/71 108/60  Pulse: 61 78  64 75  Resp: 18 18 18 18   Temp: 97.8 F (36.6 C) 98 F (36.7 C) 98.3 F (36.8 C) 98.3 F (36.8 C)  TempSrc: Oral Oral    SpO2: 98% 98% 99% 95%  Weight:      Height:        Intake/Output Summary (Last 24 hours) at 10/19/2020 0826 Last data filed at 10/19/2020 0650 Gross per 24 hour  Intake 660 ml  Output 2500 ml  Net -1840 ml    Filed Weights   10/11/20 1200 10/11/20 1505 10/14/20 2051  Weight: 52 kg 51 kg 51 kg    Examination:  General: Gaunt appearing gentleman pleasantly resting in bed, No acute distress. HEENT:  Normocephalic atraumatic.  Sclerae nonicteric, noninjected.  Extraocular movements intact bilaterally. Neck:  Without mass or deformity.  Trachea is midline.   Lungs:  Clear to auscultate bilaterally without rhonchi, wheeze, or rales. Heart:  Regular rate and rhythm.  Without murmurs, rubs, or gallops. Abdomen:  Soft, nontender, nondistended.  Without guarding or rebound. Nephrostomy tubes -minimal yellow urine output; ostomy in place; Foley draining clear yellow urine. Extremities: Without cyanosis, clubbing, edema, or obvious deformity.  Left upper extremity surgical site clean dry intact Vascular: Dorsalis pedis and posterior tibial pulses palpable bilaterally.  Right dialysis catheter in place, bandage clean dry intact Skin: Warm and dry, no erythema  Data Reviewed: I have personally reviewed following labs and imaging studies  CBC: Recent Labs  Lab 10/14/20 0605 10/16/20 0641 10/17/20 1952 10/18/20 0606 10/19/20 0529  WBC 6.1 5.7  --  4.9 5.2  HGB 7.2* 6.9* 7.8* 7.6* 7.7*  HCT 22.5* 22.0* 23.9* 24.2* 23.8*  MCV 99.6 100.0  --  96.4 97.1  PLT 170 196  --  255 944    Basic Metabolic Panel: Recent Labs  Lab 10/14/20 0605 10/14/20 1620 10/15/20 0425 10/15/20 1639 10/16/20 0641 10/16/20 1920 10/17/20 0419 10/17/20 1224 10/18/20 0606 10/19/20 0529  NA 134*   < > 132*   < > 137  137 135 135 136 137 137  K 4.0   < > 4.0   < > 4.2  4.2 3.9  4.4 4.4 3.3* 2.9*  CL 106   < > 104   < > 110  111 109 109 107 103 108  CO2 20*   < > 20*   < > 19*  19* 17* 16* 19* 25 21*  GLUCOSE 96   < > 95   < > 93  92 146* 94 105* 99 101*  BUN 38*   < > 41*   < > 53*  52* 48* 51* 47* 25* 40*  CREATININE 5.14*   < > 5.59*   < > 6.39*  6.33* 5.93* 6.12* 5.62* 3.47* 4.64*  CALCIUM 6.6*   < > 6.8*  6.4*   < > 6.9*  7.0* 7.1* 7.3* 7.1* 7.1* 7.0*  MG 2.2  --  2.2  --  2.4  --  2.2  --  2.1  --   PHOS 4.9*   < > 4.8*   < > 5.4* 5.9* 5.6* 5.0* 3.1  --    < > = values in this interval not displayed.    GFR: Estimated Creatinine Clearance: 12.8 mL/min (A) (by C-G formula based on SCr of 4.64 mg/dL (  H)). Liver Function Tests: Recent Labs  Lab 10/16/20 0641 10/16/20 1920 10/17/20 0419 10/17/20 1224 10/18/20 0606  ALBUMIN 1.7* 1.8* 1.7* 1.7* 1.8*    No results for input(s): LIPASE, AMYLASE in the last 168 hours. No results for input(s): AMMONIA in the last 168 hours. Coagulation Profile: No results for input(s): INR, PROTIME in the last 168 hours.  Cardiac Enzymes: No results for input(s): CKTOTAL, CKMB, CKMBINDEX, TROPONINI in the last 168 hours. BNP (last 3 results) No results for input(s): PROBNP in the last 8760 hours. HbA1C: No results for input(s): HGBA1C in the last 72 hours. CBG: Recent Labs  Lab 10/14/20 1648 10/15/20 1139 10/15/20 1731  GLUCAP 116* 106* 115*    Lipid Profile: No results for input(s): CHOL, HDL, LDLCALC, TRIG, CHOLHDL, LDLDIRECT in the last 72 hours. Thyroid Function Tests: No results for input(s): TSH, T4TOTAL, FREET4, T3FREE, THYROIDAB in the last 72 hours. Anemia Panel: No results for input(s): VITAMINB12, FOLATE, FERRITIN, TIBC, IRON, RETICCTPCT in the last 72 hours. Sepsis Labs: No results for input(s): PROCALCITON, LATICACIDVEN in the last 168 hours.   Recent Results (from the past 240 hour(s))  Surgical pcr screen     Status: None   Collection Time: 10/15/20  9:40 PM   Specimen: Nasal  Mucosa; Nasal Swab  Result Value Ref Range Status   MRSA, PCR NEGATIVE NEGATIVE Final   Staphylococcus aureus NEGATIVE NEGATIVE Final    Comment: (NOTE) The Xpert SA Assay (FDA approved for NASAL specimens in patients 54 years of age and older), is one component of a comprehensive surveillance program. It is not intended to diagnose infection nor to guide or monitor treatment. Performed at [facility], [place] [address], [place], [place] [address]      Radiology Studies: No results found.  Scheduled Meds:  sodium chloride   Intravenous Once   calcium carbonate  2 tablet Oral TID WC   Chlorhexidine Gluconate Cloth  6 each Topical Daily   Chlorhexidine Gluconate Cloth  6 each Topical Q0600   [START ON 10/20/2020] darbepoetin (ARANESP) injection - DIALYSIS  200 mcg Subcutaneous Q7 days   mouth rinse  15 mL Mouth Rinse BID   midodrine  10 mg Oral Q T,Th,Sa-HD   multivitamin with minerals  1 tablet Oral BID   pantoprazole  40 mg Oral Q1200   sodium chloride flush  5 mL Intracatheter Q8H   Continuous Infusions:  sodium chloride 250 mL (10/09/20 1732)   norepinephrine (LEVOPHED) Adult infusion Stopped (10/08/20 0401)     LOS: 13 days   Time spent: 30 min  Little Ishikawa, DO [facility]  If 7PM-7AM, please contact night-coverage [email or web address]  10/19/2020, 8:26 AM

## 2020-10-20 DIAGNOSIS — Z7189 Other specified counseling: Secondary | ICD-10-CM | POA: Diagnosis not present

## 2020-10-20 DIAGNOSIS — Z515 Encounter for palliative care: Secondary | ICD-10-CM | POA: Diagnosis not present

## 2020-10-20 DIAGNOSIS — G9341 Metabolic encephalopathy: Secondary | ICD-10-CM | POA: Diagnosis not present

## 2020-10-20 DIAGNOSIS — N179 Acute kidney failure, unspecified: Secondary | ICD-10-CM | POA: Diagnosis not present

## 2020-10-20 LAB — RENAL FUNCTION PANEL
Albumin: 1.9 g/dL — ABNORMAL LOW (ref 3.5–5.0)
Anion gap: 8 (ref 5–15)
BUN: 44 mg/dL — ABNORMAL HIGH (ref 6–20)
CO2: 19 mmol/L — ABNORMAL LOW (ref 22–32)
Calcium: 7.2 mg/dL — ABNORMAL LOW (ref 8.9–10.3)
Chloride: 110 mmol/L (ref 98–111)
Creatinine, Ser: 5.07 mg/dL — ABNORMAL HIGH (ref 0.61–1.24)
GFR, Estimated: 13 mL/min — ABNORMAL LOW (ref >60)
Glucose, Bld: 189 mg/dL — ABNORMAL HIGH (ref 70–99)
Phosphorus: 3 mg/dL (ref 2.5–4.6)
Potassium: 4.2 mmol/L (ref 3.5–5.1)
Sodium: 137 mmol/L (ref 135–145)

## 2020-10-20 LAB — CBC
HCT: 25.1 % — ABNORMAL LOW (ref 39.0–52.0)
Hemoglobin: 7.6 g/dL — ABNORMAL LOW (ref 13.0–17.0)
MCH: 30.8 pg (ref 26.0–34.0)
MCHC: 30.3 g/dL (ref 30.0–36.0)
MCV: 101.6 fL — ABNORMAL HIGH (ref 80.0–100.0)
Platelets: 310 10*3/uL (ref 150–400)
RBC: 2.47 MIL/uL — ABNORMAL LOW (ref 4.22–5.81)
RDW: 15.2 % (ref 11.5–15.5)
WBC: 6.8 10*3/uL (ref 4.0–10.5)
nRBC: 0 % (ref 0.0–0.2)

## 2020-10-20 MED ORDER — DARBEPOETIN ALFA 200 MCG/0.4ML IJ SOSY
PREFILLED_SYRINGE | INTRAMUSCULAR | Status: AC
  Administered 2020-10-20: 200 ug via SUBCUTANEOUS
  Filled 2020-10-20: qty 0.4

## 2020-10-20 MED ORDER — HYDROCODONE-ACETAMINOPHEN 5-325 MG PO TABS
1.0000 | ORAL_TABLET | Freq: Three times a day (TID) | ORAL | Status: DC | PRN
  Administered 2020-10-20: 2 via ORAL
  Administered 2020-10-21: 1 via ORAL
  Administered 2020-10-21 – 2020-10-22 (×4): 2 via ORAL
  Filled 2020-10-20 (×6): qty 2

## 2020-10-20 MED ORDER — MIDODRINE HCL 5 MG PO TABS
10.0000 mg | ORAL_TABLET | ORAL | Status: AC
  Administered 2020-10-20: 10 mg via ORAL

## 2020-10-20 MED ORDER — HEPARIN SODIUM (PORCINE) 1000 UNIT/ML IJ SOLN
INTRAMUSCULAR | Status: AC
  Administered 2020-10-20: 3200 [IU] via INTRAVENOUS_CENTRAL
  Filled 2020-10-20: qty 4

## 2020-10-20 MED ORDER — HYDROCODONE-ACETAMINOPHEN 5-325 MG PO TABS
ORAL_TABLET | ORAL | Status: AC
  Filled 2020-10-20: qty 1

## 2020-10-20 MED ORDER — MIDODRINE HCL 5 MG PO TABS
10.0000 mg | ORAL_TABLET | Freq: Every day | ORAL | Status: DC
  Administered 2020-10-21 – 2020-10-26 (×6): 10 mg via ORAL
  Filled 2020-10-20 (×6): qty 2

## 2020-10-20 MED ORDER — HYDROCODONE-ACETAMINOPHEN 5-325 MG PO TABS
1.0000 | ORAL_TABLET | Freq: Once | ORAL | Status: AC
Start: 2020-10-20 — End: 2020-10-20
  Administered 2020-10-20: 1 via ORAL

## 2020-10-20 NOTE — Progress Notes (Signed)
Patient ID: Chase [name], male   DOB: September 19, 1965, 55 y.o.   MRN: [id number]  4 Days Post-Op Subjective: Pt without new complaints.  Bilateral nephrostomy tubes have now been clamped for over 24 hrs.  He denies any flank pain since they were clamped.  Objective: Vital signs in last 24 hours: Temp:  [98 F (36.7 C)-98.5 F (36.9 C)] 98 F (36.7 C) (07/22 1743) Pulse Rate:  [49-71] 70 (07/22 1743) Resp:  [16-18] 17 (07/22 1743) BP: (84-104)/(40-65) 87/52 (07/22 1743) SpO2:  [97 %-100 %] 98 % (07/22 1743) Weight:  [52 kg-58.7 kg] 58.7 kg (07/22 1207)  Intake/Output from previous day: 07/21 0701 - 07/22 0700 In: 1880 [P.O.:1880] Out: 2500 [Urine:2250; Stool:250] Intake/Output this shift: Total I/O In: -  Out: -37   Physical Exam:  General: Alert and oriented GU: Foley with grossly clear urine  Lab Results: Recent Labs    10/18/20 0606 10/19/20 0529 10/20/20 0818  HGB 7.6* 7.7* 7.6*  HCT 24.2* 23.8* 25.1*   BMET Recent Labs    10/19/20 0529 10/20/20 0818  NA 137 137  K 2.9* 4.2  CL 108 110  CO2 21* 19*  GLUCOSE 101* 189*  BUN 40* 44*  CREATININE 4.64* 5.07*  CALCIUM 7.0* 7.2*     Studies/Results: No results found.  Assessment/Plan: 1) Bilateral hydronephrosis: Unclear etiology and unclear whether his hydronephrosis is truly obstructive.  He has indwelling bilateral ureteral stents but had bilateral nephrostomy tube placement on admission to optimize chance for renal function recovery.  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  He has tolerated trial of clamping nephrostomy tubes thus far with adequate UOP similar to UOP with nephrostomy tubes draining.  If continues to do well with this trial, will see if IR can remove nephrostomy tubes early next week (under fluoroscopy so as not  to dislodge ureteral stents).   2) Bladder outlet obstruction:  He was scheduled for TURP prior to this admission and still may benefit from this to relieve bladder outlet obstruction and eventually to be rid of his urethral catheter. This will now need to delayed until he is medically stable.  His catheter was changed on 10/08/20.  Will plan to set up outpatient follow up at time of discharge.       LOS: 14 days   [name] 10/20/2020, 6:56 PM

## 2020-10-20 NOTE — Progress Notes (Signed)
PROGRESS NOTE    Chase [name]  WNU:[id number] DOB: [birth year] DOA: 10/06/2020 PCP: [name]   Brief Narrative:  Chase [name] is a 55 y.o. male with a PMH significant for CKD stage V, hx of GIB and ulcers requiring gastric bypass, prior stroke, hodgkin's lymphoma, prior MI, cervical radiculopathy, chronic pain syndrome, COVID, and PTSD who presented to the ED via EMS after being found minimally responsive on welfare check. On EMS arrival patient was found in recliner alert but confused. On chart review it appears patient was scheduled for pre op lab work 7/6 and TURP 7/7 but failed to show for either appointment. Patient has an extensive past medical history to include CKD stage 5 that progressed to acute renal failure march of this year and required CRRT. He also has indwelling catheter and bilateral nephrostomy tubes for hydronephrosis. He presents altered but in stable condition with no immediate needs for hemodialysis. Acidosis has improved with bicarb drip. Nephrology has already been consulted.  Assessment & Plan:   Principal Problem:   Acute renal failure superimposed on stage 4 chronic kidney disease (HCC) Active Problems:   BPH (benign prostatic hyperplasia)   Postlaminectomy syndrome, not elsewhere classified   PTSD (post-traumatic stress disorder)   AKI (acute kidney injury) ([place])   Anxiety and depression   Anemia in chronic kidney disease   Acute metabolic encephalopathy   Acute renal failure (ARF) (HCC)   Pressure ulcer  Acute on chronic renal failure - CKD-V  Progressing to ESRD transitioning to dialysis TTS - Related to resumed worsening prostatic/obstruction given outpatient plan TURP and missed appointments/noncompliance - Status post bilateral nephrostomy tubes - previously requiring CRRT (off as of 10/09/20).  - Nephrology transitioning patient to intermittent hemodialysis - 10/16/20 - tolerated 19 cm palindrome tunneled catheter placement and L  brachiobasilic arteriovenous fistula placement well - Vascular/Nephrology/Urology following - appreciate insight and recommendations; Urology: trial of bilateral nephrostomy tube clamping -appears to be tolerating well, defer to urology for ultimate decision for removal  Chronic anemia, likely of chronic disease and malnutrition - Received 2 units pRBC 7/11 - Hemoglobin minimally downtrending, 6.9 this morning, nephrology to initiate EPO -defer to their expertise on additional transfusion during dialysis or follow along with a EPO only  Failure to thrive, severe protein calorie malnutrition - Continue to encourage PO intake - liberalize diet and increase PT/OT  Hypotension:  - Related to chronic wasting and acute acidemia, improved, off pressors. Continue midodrine.  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  Unclear if he will ever be candidate for re-anastamosis to rectal stump.  DVT prophylaxis: holding in the setting of anemia Code Status: DNR Family Communication: Patient continues to update son himself per our discussion  Status is: Inpt  Dispo: The patient is from: Home              Anticipated d/c is to: TBD - requesting DC home when possible - needs further evaluation with urology, nephrology and will need clip for dialysis slot before DC              Anticipated d/c date is: >72h              Patient currently NOT medically stable for discharge  Consultants:  Nephrology, Urology  Antimicrobials:  None   Subjective: No acute issues or events overnight; patient continues to complain of generalized back and neck pain from laying in  bed overnight was initiated on IV fentanyl which we discussed was not appropriate.  If anything we will continue to wean his narcotics down given he is not a chronic opioid user prior to admission with no notable history of  chronic lumbago or neck pain we discussed his current issues are due to his bedbound status and that he needs to continue to increase mobility and get out of bed whenever indicated.  Otherwise denies nausea vomiting diarrhea constipation headache fevers chills or chest pain.  Objective: Vitals:   10/19/20 2211 10/19/20 2347 10/20/20 0230 10/20/20 0551  BP: (!) 98/55 (!) 96/53 (!) 104/55 100/65  Pulse:    (!) 57  Resp:    17  Temp:    98.2 F (36.8 C)  TempSrc:    Oral  SpO2:    98%  Weight:      Height:        Intake/Output Summary (Last 24 hours) at 10/20/2020 0806 Last data filed at 10/20/2020 0700 Gross per 24 hour  Intake 1880 ml  Output 2500 ml  Net -620 ml    Filed Weights   10/11/20 1505 10/14/20 2051 10/19/20 2121  Weight: 51 kg 51 kg 52 kg    Examination:  General: Gaunt appearing gentleman pleasantly resting in bed, No acute distress. HEENT:  Normocephalic atraumatic.  Sclerae nonicteric, noninjected.  Extraocular movements intact bilaterally. Neck:  Without mass or deformity.  Trachea is midline.   Lungs:  Clear to auscultate bilaterally without rhonchi, wheeze, or rales. Heart:  Regular rate and rhythm.  Without murmurs, rubs, or gallops. Abdomen:  Soft, nontender, nondistended.  Without guarding or rebound. Nephrostomy tubes ; ostomy in place; Foley draining clear yellow urine. Extremities: Without cyanosis, clubbing, edema, or obvious deformity.  Left upper extremity surgical site clean dry intact Vascular: Dorsalis pedis and posterior tibial pulses palpable bilaterally.  Right dialysis catheter in place, bandage clean dry intact Skin: Warm and dry, no erythema  Data Reviewed: I have personally reviewed following labs and imaging studies  CBC: Recent Labs  Lab 10/14/20 0605 10/16/20 0641 10/17/20 1952 10/18/20 0606 10/19/20 0529  WBC 6.1 5.7  --  4.9 5.2  HGB 7.2* 6.9* 7.8* 7.6* 7.7*  HCT 22.5* 22.0* 23.9* 24.2* 23.8*  MCV 99.6 100.0  --  96.4 97.1   PLT 170 196  --  255 426    Basic Metabolic Panel: Recent Labs  Lab 10/14/20 0605 10/14/20 1620 10/15/20 0425 10/15/20 1639 10/16/20 0641 10/16/20 1920 10/17/20 0419 10/17/20 1224 10/18/20 0606 10/19/20 0529  NA 134*   < > 132*   < > 137  137 135 135 136 137 137  K 4.0   < > 4.0   < > 4.2  4.2 3.9 4.4 4.4 3.3* 2.9*  CL 106   < > 104   < > 110  111 109 109 107 103 108  CO2 20*   < > 20*   < > 19*  19* 17* 16* 19* 25 21*  GLUCOSE 96   < > 95   < > 93  92 146* 94 105* 99 101*  BUN 38*   < > 41*   < > 53*  52* 48* 51* 47* 25* 40*  CREATININE 5.14*   < > 5.59*   < > 6.39*  6.33* 5.93* 6.12* 5.62* 3.47* 4.64*  CALCIUM 6.6*   < > 6.8*  6.4*   < > 6.9*  7.0* 7.1* 7.3* 7.1* 7.1* 7.0*  MG 2.2  --  2.2  --  2.4  --  2.2  --  2.1  --   PHOS 4.9*   < > 4.8*   < > 5.4* 5.9* 5.6* 5.0* 3.1  --    < > = values in this interval not displayed.    GFR: Estimated Creatinine Clearance: 13.1 mL/min (A) (by C-G formula based on SCr of 4.64 mg/dL (H)). Liver Function Tests: Recent Labs  Lab 10/16/20 0641 10/16/20 1920 10/17/20 0419 10/17/20 1224 10/18/20 0606  ALBUMIN 1.7* 1.8* 1.7* 1.7* 1.8*    No results for input(s): LIPASE, AMYLASE in the last 168 hours. No results for input(s): AMMONIA in the last 168 hours. Coagulation Profile: No results for input(s): INR, PROTIME in the last 168 hours.  Cardiac Enzymes: No results for input(s): CKTOTAL, CKMB, CKMBINDEX, TROPONINI in the last 168 hours. BNP (last 3 results) No results for input(s): PROBNP in the last 8760 hours. HbA1C: No results for input(s): HGBA1C in the last 72 hours. CBG: Recent Labs  Lab 10/14/20 1648 10/15/20 1139 10/15/20 1731  GLUCAP 116* 106* 115*    Lipid Profile: No results for input(s): CHOL, HDL, LDLCALC, TRIG, CHOLHDL, LDLDIRECT in the last 72 hours. Thyroid Function Tests: No results for input(s): TSH, T4TOTAL, FREET4, T3FREE, THYROIDAB in the last 72 hours. Anemia Panel: No results for  input(s): VITAMINB12, FOLATE, FERRITIN, TIBC, IRON, RETICCTPCT in the last 72 hours. Sepsis Labs: No results for input(s): PROCALCITON, LATICACIDVEN in the last 168 hours.   Recent Results (from the past 240 hour(s))  Surgical pcr screen     Status: None   Collection Time: 10/15/20  9:40 PM   Specimen: Nasal Mucosa; Nasal Swab  Result Value Ref Range Status   MRSA, PCR NEGATIVE NEGATIVE Final   Staphylococcus aureus NEGATIVE NEGATIVE Final    Comment: (NOTE) The Xpert SA Assay (FDA approved for NASAL specimens in patients 47 years of age and older), is one component of a comprehensive surveillance program. It is not intended to diagnose infection nor to guide or monitor treatment. Performed at [facility], [place] [address] [place], [place] [address]      Radiology Studies: No results found.  Scheduled Meds:  sodium chloride   Intravenous Once   calcium carbonate  2 tablet Oral TID WC   Chlorhexidine Gluconate Cloth  6 each Topical Daily   Chlorhexidine Gluconate Cloth  6 each Topical Q0600   darbepoetin (ARANESP) injection - DIALYSIS  200 mcg Subcutaneous Q7 days   mouth rinse  15 mL Mouth Rinse BID   midodrine  10 mg Oral Q T,Th,Sa-HD   multivitamin with minerals  1 tablet Oral BID   pantoprazole  40 mg Oral Q1200   sodium chloride flush  5 mL Intracatheter Q8H   Continuous Infusions:  sodium chloride 250 mL (10/09/20 1732)   norepinephrine (LEVOPHED) Adult infusion Stopped (10/08/20 0401)     LOS: 14 days   Time spent: 50 min  Little Ishikawa, DO [facility]  If 7PM-7AM, please contact night-coverage [email or web address]  10/20/2020, 8:06 AM

## 2020-10-20 NOTE — Progress Notes (Signed)
Physical Therapy Treatment Patient Details Name: Chase [name] MRN: [id number] DOB: Apr 04, 1965 Today's Date: 10/20/2020    History of Present Illness Pt is 55 yo male who presented to the ED via EMS after being found minimally responsive during a welfare check. PMHx significant for CKD stage IV, hx of GIB and ulcers requiring gastric bypass, Hx of CVA, hodgkin's lymphoma, prior MI, cervical radiculopathy, chronic pain syndrome, COVID, and PTSD.    PT Comments    Pt just returned from HD. Requesting pain medication. Pt reports he thinks he is lying on a tube or cord, able to roll R and L with mod A and pulling on bed rail. During bed mobility, bed noted to have low air pressure. Pt assisted with pulling to HoB. Out of bed deferred due to low air pressure. And discomfort of sitting on bed frame. Pt continues to get HD in supine. Questioned as to whether he will need to be able to tolerate sitting up for HD at [facility] dialysis center. Pt reports they have beds for dialysis. [employer], the bed company, called for service call.  PT continues to recommend SNF level rehab, however appears he will be going home with maximal HH support. PT will continue to follow acutely.   Follow Up Recommendations  Home health PT;Other (comment);Supervision - Intermittent (pt refuses SNF, encouraging HHAide through [place] however pt wants some days to his self.)     Equipment Recommendations  None recommended by PT (has necessary equipment)    Recommendations for Other Services       Precautions / Restrictions Precautions Precautions: Fall Restrictions Weight Bearing Restrictions: No    Mobility  Bed Mobility Overal bed mobility: Needs Assistance Bed Mobility: Supine to Sit;Sit to Supine Rolling: Mod assist         General bed mobility comments: modA for rolling R and L as pt feels he is laying on a cord or tube. Then able to assist with UE to pull to EoB with min A    Transfers                  General transfer comment: deferred due to low air pressure in bed           Cognition Arousal/Alertness: Awake/alert Behavior During Therapy: Flat affect;WFL for tasks assessed/performed Overall Cognitive Status: Within Functional Limits for tasks assessed                                 General Comments: pt continues to ask for pain medication despite change to 8 hr regimen      Exercises General Exercises - Upper Extremity Shoulder Flexion: Strengthening;Both;10 reps Shoulder ABduction: Strengthening;Both;10 reps Elbow Flexion: Strengthening;Both;10 reps    General Comments General comments (skin integrity, edema, etc.): Low air pressure alarm sounding, [employer] called for service call when unable to trouble shoot with service rep Order #[id number]      Pertinent Vitals/Pain Pain Assessment: 0-10 Pain Score: 7  Faces Pain Scale: Hurts a little bit Pain Location: generalized Pain Descriptors / Indicators: Discomfort;Grimacing Pain Intervention(s): Limited activity within patient's tolerance;Monitored during session;Repositioned     PT Goals (current goals can now be found in the care plan section) Acute Rehab PT Goals Patient Stated Goal: to go home from the hospital PT Goal Formulation: With patient Time For Goal Achievement: 10/26/20 Potential to Achieve Goals: Poor Progress towards PT goals: Progressing toward goals    Frequency  Min 3X/week      PT Plan Current plan remains appropriate       AM-PAC PT "6 Clicks" Mobility   Outcome Measure  Help needed turning from your back to your side while in a flat bed without using bedrails?: A Lot Help needed moving from lying on your back to sitting on the side of a flat bed without using bedrails?: Total Help needed moving to and from a bed to a chair (including a wheelchair)?: Total Help needed standing up from a chair using your arms (e.g., wheelchair or bedside chair)?: Total Help needed to  walk in hospital room?: Total Help needed climbing 3-5 steps with a railing? : Total 6 Click Score: 7    End of Session   Activity Tolerance: Patient tolerated treatment well Patient left: in bed;with call bell/phone within reach;with bed alarm set Nurse Communication: Mobility status;Patient requests pain meds PT Visit Diagnosis: Muscle weakness (generalized) (M62.81);Repeated falls (R29.6);History of falling (Z91.81);Unsteadiness on feet (R26.81);Other abnormalities of gait and mobility (R26.89);Difficulty in walking, not elsewhere classified (R26.2);Hemiplegia and hemiparesis;Pain Hemiplegia - Right/Left: Left Hemiplegia - dominant/non-dominant: Non-dominant Hemiplegia - caused by: Cerebral infarction Pain - Right/Left:  (bilateral) Pain - part of body:  (kidneys and back)     Time: 1250-1302 PT Time Calculation (min) (ACUTE ONLY): 12 min  Charges:  $Therapeutic Exercise: 8-22 mins                     Shanee Batch B. Migdalia Dk PT, DPT Acute Rehabilitation Services Pager [phone] Office [phone]    [place] 10/20/2020, 2:28 PM

## 2020-10-20 NOTE — Procedures (Signed)
Patient was seen on dialysis and the procedure was supervised.  BFR 400  Via TDC BP is  100/54.   Patient appears to be tolerating treatment well  Chase [name] 10/20/2020

## 2020-10-20 NOTE — TOC Progression Note (Addendum)
Transition of Care [facility]) - Progression Note    Patient Details  Name: Chase [name] MRN: [id number] Date of Birth: 08/11/1965  Transition of Care [facility]) CM/SW [place], [place] Phone Number: 10/20/2020, 3:27 PM  Clinical Narrative:      Ernst Breach at [facility] ext [phone] called CSW. She explained pt will start Dialysis on Tuesday at [facility] clinic. He will do dialysis on Wednesday  as well and continue with a MWF schedule. She has arranged for transportation to pick pt up at his house on Tuesday 7/26. She requests  DC summary to be faxed at discharge to [phone]  CSW notified pt and attending.   Expected Discharge Plan: [place] Barriers to Discharge: Continued Medical Work up  Expected Discharge Plan and Services Expected Discharge Plan: [place]   Discharge Planning Services: CM Consult Post Acute Care Choice: [place] arrangements for the past 2 months: Single Family Home                           HH Arranged: RN, PT, OT, Nurse's Aide HH Agency: [place] Date [facility] Agency Contacted: 10/12/20 Time [place]: 1200 Representative spoke with at [place]: [place] ([place]) Interventions    Readmission Risk Interventions Readmission Risk Prevention Plan 07/04/2020 12/25/2019 11/19/2019  Transportation Screening Complete Complete Complete  Medication Review Press photographer) Complete Complete Complete  PCP or Specialist appointment within 3-5 days of discharge Complete - Complete  HRI or Home Care Consult Complete - Complete  SW Recovery Care/Counseling Consult Complete - Complete  Palliative Care Screening Not Applicable - Not Applicable  Skilled Nursing Facility Not Applicable - Complete  Some recent data might be hidden

## 2020-10-20 NOTE — Progress Notes (Signed)
PT Cancellation Note  Patient Details Name: Chase [name] MRN: [id number] DOB: 03/27/1966   Cancelled Treatment:    Reason Eval/Treat Not Completed: (P) Patient at procedure or test/unavailable Pt is off floor for HD. PT will follow back this afternoon as able.   Luvinia Lucy B. Migdalia Dk PT, DPT Acute Rehabilitation Services Pager [phone] Office [phone]    [place] 10/20/2020, 10:08 AM

## 2020-10-20 NOTE — Progress Notes (Signed)
[facility] Progress Note  Name: Chase [name] MRN: [id number] DOB: 05-Mar-1966    Subjective: Seen on HD-  his treatment was bumped from yesterday to today so basically has only had HD 2 times this week-  no new c/o's -  more pleasant than he has been in past-  good UOP but BUN/crt rise in between HD treatments   ------------- Background on referral:  55 y.o. 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  He was supposed to have a TURP  but didn't comply.  He was then found to be unresponsive on a welfare check.   We last saw him in 01/02/20 inpatient and he was lost to follow-up with [facility] and nephrology at the [facility].  He has had interim hospitalizations including one with CRRT.  At a SNF at one point but most recently at home.     Dr. Royce Macadamia had an extensive conversation with the patient, his son (present via phone), and his nurse.  We discussed the risks/benefits/indications of dialysis/RRT.  They do wish to pursue dialysis and consent for renal replacement therapy.  The patient wishes it hadn't come to this but is not ready to transition to comfort care.  He wants to live independently but his son discussed that that hadn't been going well.   Intake/Output Summary (Last 24 hours) at 10/20/2020 1138 Last data filed at 10/20/2020 0700 Gross per 24 hour  Intake 1160 ml  Output 2350 ml  Net -1190 ml    Vitals:  Vitals:   10/20/20 0951 10/20/20 1030 10/20/20 1100 10/20/20 1130  BP: (!) 95/57 (!) 92/45 (!) 100/54 (!) 84/40  Pulse: (!) 57 (!) 52 (!) 53 (!) 49  Resp:      Temp:      TempSrc:      SpO2:      Weight:      Height:         Physical Exam:  General: thin chronically ill appearing gentleman  HEENT: [place] AT Eyes: EOMI, anicteric Neck: supple trachea midline Heart: RRR on monitor Lungs: normal WOB on RA Extremities: no edema  GU foley in place Access: - new TDC and left upper  arm AVF wit thrill and bruit  Medications reviewed   Labs:  BMP Latest Ref Rng & Units 10/20/2020 10/19/2020 10/18/2020  Glucose 70 - 99 mg/dL 189(H) 101(H) 99  BUN 6 - 20 mg/dL 44(H) 40(H) 25(H)  Creatinine 0.61 - 1.24 mg/dL 5.07(H) 4.64(H) 3.47(H)  Sodium 135 - 145 mmol/L 137 137 137  Potassium 3.5 - 5.1 mmol/L 4.2 2.9(L) 3.3(L)  Chloride 98 - 111 mmol/L 110 108 103  CO2 22 - 32 mmol/L 19(L) 21(L) 25  Calcium 8.9 - 10.3 mg/dL 7.2(L) 7.0(L) 7.1(L)     Assessment/Plan:   # 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  Presented with severe AKI requiring CRRT 7/9. Had BL hydronephrosis despite foley and ureteral stents so underwent nephrostomy tube placement.  Despite decompression with good UOP 24h CrCl 3 consistent with ESRD.  - Off CRRT 7/11, had HD for clearance 7/13; declined HD 7/16 -  then done on 7/19 and today.  Had been doing TTS but now will change to MWF-  plan next for Monday-- Appreicate VVS consult re: perm access and [facility]  has been done- new left upper arm AVF on 7/18 -Outpt CLIP underway -  he wants to do dialysis at the [place] in [place]-  have told them, is in the works    # Chronic urinary retention: missed scheduled TURP per charting - s/p BL nephrostomy tubes this admission - plan per urology for nephrostomy tubes --> given committed to dialysis now plan will be maintain comfort and avoid infection-  looking to internalize prior to discharge   # Hx CVA - left hemiparesis - previously at SNF and noted he presented from home most recently   # Anemia - normocytic.  Has rec'd 2u pRBC.   Started ESA 7/11. have increased dose-  s/p transfusion 7/19 with HD  #BMM: PTH 64 , corr ca a bit low - supplement, phos ok no binder.    Insists upon return home vs SNF - concern expressed about feasibility of plan but he insists with home health and [facility] transport to dialysis in [place]-  renal navigator is aware but  we have not heard of OP HD spot yet.  Will change labs to only with HD and await a discharge plan   Louis Meckel, MD 10/20/2020 11:38 AM

## 2020-10-21 DIAGNOSIS — Z992 Dependence on renal dialysis: Secondary | ICD-10-CM | POA: Diagnosis not present

## 2020-10-21 DIAGNOSIS — D631 Anemia in chronic kidney disease: Secondary | ICD-10-CM | POA: Diagnosis not present

## 2020-10-21 DIAGNOSIS — N186 End stage renal disease: Secondary | ICD-10-CM | POA: Diagnosis not present

## 2020-10-21 LAB — CBC
HCT: 27.4 % — ABNORMAL LOW (ref 39.0–52.0)
Hemoglobin: 8.7 g/dL — ABNORMAL LOW (ref 13.0–17.0)
MCH: 31.6 pg (ref 26.0–34.0)
MCHC: 31.8 g/dL (ref 30.0–36.0)
MCV: 99.6 fL (ref 80.0–100.0)
Platelets: 309 10*3/uL (ref 150–400)
RBC: 2.75 MIL/uL — ABNORMAL LOW (ref 4.22–5.81)
RDW: 15.2 % (ref 11.5–15.5)
WBC: 7 10*3/uL (ref 4.0–10.5)
nRBC: 0 % (ref 0.0–0.2)

## 2020-10-21 NOTE — Progress Notes (Signed)
PROGRESS NOTE  Chase [name] YKZ:[id number] DOB: 07-Aug-1965 DOA: 10/06/2020 PCP: [name]  HPI/Recap of past 24 hours: This is a 55 year old male with past medical history significant for chronic kidney disease stage V history of GI bleed and also requiring gastric bypass prior stroke, Hodgkin's lymphoma, prior MI, cervical radiculopathy, chronic pain syndrome, COVID, PTSD he was admitted after being found minimally responsive. He currently has a nephrostomy tube for hydronephrosis and he started hemodialysis  Assessment/Plan: Principal Problem:   Acute renal failure superimposed on stage 4 chronic kidney disease ([place]) Active Problems:   BPH (benign prostatic hyperplasia)   Postlaminectomy syndrome, not elsewhere classified   PTSD (post-traumatic stress disorder)   AKI (acute kidney injury) ([place])   Anxiety and depression   Anemia in chronic kidney disease   Acute metabolic encephalopathy   Acute renal failure (ARF) (HCC)   Pressure ulcer  End-stage renal disease on hemodialysis.  Patient is said to have made arrangements to have hemodialysis at the [facility] on Tuesday.  Followed by Monday Wednesday and Fridays  Hydronephrosis he has nephrostomy tubes in place he is on currently on trial of clamping.  He has not had no problem with the clamped nephrostomy tube. Urology plans to remove the nephrostomy tubes under fluoroscopy if he continues to do well on trial of nephrostomy clamp  Hypotension.  Patient is on midodrine he has had Levophed recently  Chronic anemia likely due to chronic disease end-stage renal disease and malnutrition. Patient received 2 units of packed RBC on October 09, 2020 Hemoglobin is stable at 8.7 today Continue apple per nephrology  Failure to thrive, severe protein calorie malnutrition Continue to trend encourage p.o. intake  Recurrent diverticulitis with abscess formation leading to complex urological obstruction Status post diverting  ostomy with Hartmann resection  Code Status: DNR  Severity of Illness: The appropriate patient status for this patient is INPATIENT. Inpatient status is judged to be reasonable and necessary in order to provide the required intensity of service to ensure the patient's safety. The patient's presenting symptoms, physical exam findings, and initial radiographic and laboratory data in the context of their chronic comorbidities is felt to place them at high risk for further clinical deterioration. Furthermore, it is not anticipated that the patient will be medically stable for discharge from the hospital within 2 midnights of admission. The following factors support the patient status of inpatient.  Hydronephrosis nephrostomy tube in place pending removal* I certify that at the point of admission it is my clinical judgment that the patient will require inpatient hospital care spanning beyond 2 midnights from the point of admission due to high intensity of service, high risk for further deterioration and high frequency of surveillance required.*   Family Communication: None at bedside  Disposition Plan:   Status is: Inpatient   Dispo: The patient is from: Home              Anticipated d/c is to: SNF - patient agreeable for SNF              Anticipated d/c date is: 33 to 72 hours              Patient currently not medically stable for discharge  Consultants: Urology Nephrology Vascular  Procedures: Nephrostomy tube  Antimicrobials: Now  DVT prophylaxis: SCD   Objective: Vitals:   10/20/20 1743 10/20/20 2144 10/21/20 0529 10/21/20 0531  BP: (!) 87/52 (!) 92/57 (!) 88/57 (!) 90/55  Pulse: 70 76 65  60  Resp: 17 18  20   Temp: 98 F (36.7 C) 99.1 F (37.3 C) 98.7 F (37.1 C) 98.6 F (37 C)  TempSrc: Oral Oral Oral Oral  SpO2: 98% 98% 95% 96%  Weight:      Height:        Intake/Output Summary (Last 24 hours) at 10/21/2020 0915 Last data filed at 10/21/2020 0538 Gross per 24 hour   Intake 120 ml  Output 2138 ml  Net -2018 ml   Filed Weights   10/19/20 2121 10/20/20 0836 10/20/20 1207  Weight: 52 kg 58.1 kg 58.7 kg   Body mass index is 20.89 kg/m.  Exam:  General: 55 y.o. year-old male well developed poorly nourished in no acute distress.  Alert and oriented x3. Cardiovascular: Regular rate and rhythm with no rubs or gallops.  No thyromegaly or JVD noted.   Respiratory: Clear to auscultation with no wheezes or rales. Good inspiratory effort. Abdomen: Soft nontender nondistended with normal bowel sounds x4 quadrants. Musculoskeletal: No lower extremity edema. 2/4 pulses in all 4 extremities. Skin: No ulcerative lesions noted or rashes, Psychiatry: Mood is appropriate for condition and setting Neurology:    Data Reviewed: CBC: Recent Labs  Lab 10/16/20 0641 10/17/20 1952 10/18/20 0606 10/19/20 0529 10/20/20 0818  WBC 5.7  --  4.9 5.2 6.8  HGB 6.9* 7.8* 7.6* 7.7* 7.6*  HCT 22.0* 23.9* 24.2* 23.8* 25.1*  MCV 100.0  --  96.4 97.1 101.6*  PLT 196  --  255 247 053   Basic Metabolic Panel: Recent Labs  Lab 10/15/20 0425 10/15/20 1639 10/16/20 0641 10/16/20 1920 10/17/20 0419 10/17/20 1224 10/18/20 0606 10/19/20 0529 10/20/20 0818  NA 132*   < > 137  137 135 135 136 137 137 137  K 4.0   < > 4.2  4.2 3.9 4.4 4.4 3.3* 2.9* 4.2  CL 104   < > 110  111 109 109 107 103 108 110  CO2 20*   < > 19*  19* 17* 16* 19* 25 21* 19*  GLUCOSE 95   < > 93  92 146* 94 105* 99 101* 189*  BUN 41*   < > 53*  52* 48* 51* 47* 25* 40* 44*  CREATININE 5.59*   < > 6.39*  6.33* 5.93* 6.12* 5.62* 3.47* 4.64* 5.07*  CALCIUM 6.8*  6.4*   < > 6.9*  7.0* 7.1* 7.3* 7.1* 7.1* 7.0* 7.2*  MG 2.2  --  2.4  --  2.2  --  2.1  --   --   PHOS 4.8*   < > 5.4* 5.9* 5.6* 5.0* 3.1  --  3.0   < > = values in this interval not displayed.   GFR: Estimated Creatinine Clearance: 13.5 mL/min (A) (by C-G formula based on SCr of 5.07 mg/dL (H)). Liver Function Tests: Recent Labs   Lab 10/16/20 1920 10/17/20 0419 10/17/20 1224 10/18/20 0606 10/20/20 0818  ALBUMIN 1.8* 1.7* 1.7* 1.8* 1.9*   No results for input(s): LIPASE, AMYLASE in the last 168 hours. No results for input(s): AMMONIA in the last 168 hours. Coagulation Profile: No results for input(s): INR, PROTIME in the last 168 hours. Cardiac Enzymes: No results for input(s): CKTOTAL, CKMB, CKMBINDEX, TROPONINI in the last 168 hours. BNP (last 3 results) No results for input(s): PROBNP in the last 8760 hours. HbA1C: No results for input(s): HGBA1C in the last 72 hours. CBG: Recent Labs  Lab 10/14/20 1648 10/15/20 1139 10/15/20 1731  GLUCAP 116* 106*  115*   Lipid Profile: No results for input(s): CHOL, HDL, LDLCALC, TRIG, CHOLHDL, LDLDIRECT in the last 72 hours. Thyroid Function Tests: No results for input(s): TSH, T4TOTAL, FREET4, T3FREE, THYROIDAB in the last 72 hours. Anemia Panel: No results for input(s): VITAMINB12, FOLATE, FERRITIN, TIBC, IRON, RETICCTPCT in the last 72 hours. Urine analysis:    Component Value Date/Time   COLORURINE YELLOW 10/06/2020 1931   APPEARANCEUR TURBID (A) 10/06/2020 1931   LABSPEC 1.014 10/06/2020 1931   PHURINE 6.0 10/06/2020 1931   GLUCOSEU NEGATIVE 10/06/2020 1931   HGBUR SMALL (A) 10/06/2020 1931   BILIRUBINUR NEGATIVE 10/06/2020 1931   KETONESUR 5 (A) 10/06/2020 1931   PROTEINUR 100 (A) 10/06/2020 1931   NITRITE NEGATIVE 10/06/2020 1931   LEUKOCYTESUR MODERATE (A) 10/06/2020 1931   Sepsis Labs: @LABRCNTIP (procalcitonin:4,lacticidven:4)  ) Recent Results (from the past 240 hour(s))  Surgical pcr screen     Status: None   Collection Time: 10/15/20  9:40 PM   Specimen: Nasal Mucosa; Nasal Swab  Result Value Ref Range Status   MRSA, PCR NEGATIVE NEGATIVE Final   Staphylococcus aureus NEGATIVE NEGATIVE Final    Comment: (NOTE) The Xpert SA Assay (FDA approved for NASAL specimens in patients 50 years of age and older), is one component of a  comprehensive surveillance program. It is not intended to diagnose infection nor to guide or monitor treatment. Performed at [facility], [place] [address], [place], [place] [address]       Studies: No results found.  Scheduled Meds:  sodium chloride   Intravenous Once   calcium carbonate  2 tablet Oral TID WC   Chlorhexidine Gluconate Cloth  6 each Topical Daily   Chlorhexidine Gluconate Cloth  6 each Topical Q0600   darbepoetin (ARANESP) injection - DIALYSIS  200 mcg Subcutaneous Q7 days   mouth rinse  15 mL Mouth Rinse BID   midodrine  10 mg Oral Daily   multivitamin with minerals  1 tablet Oral BID   pantoprazole  40 mg Oral Q1200   sodium chloride flush  5 mL Intracatheter Q8H    Continuous Infusions:  sodium chloride 250 mL (10/09/20 1732)   norepinephrine (LEVOPHED) Adult infusion Stopped (10/08/20 0401)     LOS: 15 days     Cristal Deer, MD [facility]  To reach me or the doctor on call, go to: [email or web address] Password [email or web address]  10/21/2020, 9:15 AM

## 2020-10-21 NOTE — Progress Notes (Signed)
[facility] Progress Note  Name: Chase [name] MRN: [id number] DOB: 1965-06-15    Subjective: no issues with HD yest-  nephrostomy tubes have been clamped but fine UOP-  urology considering removal -  eating breakfast-  possibly has dialysis set up at [facility] on 7/26 ?? But have not heard officially about discharge    ------------- Background on referral:  55 y.o. 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  He was supposed to have a TURP  but didn't comply.  He was then found to be unresponsive on a welfare check.   We last saw him in 01/02/20 inpatient and he was lost to follow-up with [facility] and nephrology at the [facility].  He has had interim hospitalizations including one with CRRT.  At a SNF at one point but most recently at home.     Dr. Royce Macadamia had an extensive conversation with the patient, his son (present via phone), and his nurse.  We discussed the risks/benefits/indications of dialysis/RRT.  They do wish to pursue dialysis and consent for renal replacement therapy.  The patient wishes it hadn't come to this but is not ready to transition to comfort care.  He wants to live independently but his son discussed that that hadn't been going well.   Intake/Output Summary (Last 24 hours) at 10/21/2020 0904 Last data filed at 10/21/2020 0538 Gross per 24 hour  Intake 120 ml  Output 2138 ml  Net -2018 ml    Vitals:  Vitals:   10/20/20 1743 10/20/20 2144 10/21/20 0529 10/21/20 0531  BP: (!) 87/52 (!) 92/57 (!) 88/57 (!) 90/55  Pulse: 70 76 65 60  Resp: 17 18  20   Temp: 98 F (36.7 C) 99.1 F (37.3 C) 98.7 F (37.1 C) 98.6 F (37 C)  TempSrc: Oral Oral Oral Oral  SpO2: 98% 98% 95% 96%  Weight:      Height:         Physical Exam:  General: thin chronically ill appearing gentleman  HEENT: [place] AT Eyes: EOMI, anicteric Neck: supple trachea midline Heart: RRR on monitor Lungs: normal WOB on  RA Extremities: no edema  GU foley in place Access: - new TDC and left upper arm AVF wit thrill and bruit  Medications reviewed   Labs:  BMP Latest Ref Rng & Units 10/20/2020 10/19/2020 10/18/2020  Glucose 70 - 99 mg/dL 189(H) 101(H) 99  BUN 6 - 20 mg/dL 44(H) 40(H) 25(H)  Creatinine 0.61 - 1.24 mg/dL 5.07(H) 4.64(H) 3.47(H)  Sodium 135 - 145 mmol/L 137 137 137  Potassium 3.5 - 5.1 mmol/L 4.2 2.9(L) 3.3(L)  Chloride 98 - 111 mmol/L 110 108 103  CO2 22 - 32 mmol/L 19(L) 21(L) 25  Calcium 8.9 - 10.3 mg/dL 7.2(L) 7.0(L) 7.1(L)     Assessment/Plan:   # 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  Presented with severe AKI requiring CRRT 7/9. Had BL hydronephrosis despite foley and ureteral stents so underwent nephrostomy tube placement.  Despite decompression with good UOP 24h CrCl 3 consistent with ESRD.  - Off CRRT 7/11, had HD for clearance 7/13; declined HD 7/16 -  then done on 7/19 and today.  Had been doing TTS but last was on Friday-- Appreicate VVS consult re: perm access and [facility]  has been done- new left upper arm AVF on 7/18 -Outpt CLIP underway -  he  wants to do dialysis at the [place] in [place]-   is in the works -  I see note from progression that he has spot at [facility] can start on 7/26-  not sure when that means will be discharged ? I had planned to do HD here on Monday but may not need especially if will be discharged on Monday-  will check labs early Monday AM and make plan based on those   # Chronic urinary retention: missed scheduled TURP per charting - s/p BL nephrostomy tubes this admission - plan per urology for nephrostomy tubes --> given committed to dialysis now plan will be maintain comfort and avoid infection-  looking to internalize/remove prior to discharge   # Hx CVA - left hemiparesis - previously at SNF and noted he presented from home most recently   # Anemia - normocytic.  Has rec'd 2u  pRBC.   Started ESA 7/11. have increased dose-  s/p transfusion 7/19 with HD  #BMM: PTH 64 , corr ca a bit low - supplement, phos ok no binder.    Insists upon return home vs SNF - concern expressed about feasibility of plan but he insists with home health and [facility] transport [facility] dialysis in [place]-  renal navigator is aware possibly has OP HD spot starting Tuesday 7/26.    Louis Meckel, MD 10/21/2020 9:04 AM

## 2020-10-22 DIAGNOSIS — N186 End stage renal disease: Secondary | ICD-10-CM | POA: Diagnosis not present

## 2020-10-22 DIAGNOSIS — G9341 Metabolic encephalopathy: Secondary | ICD-10-CM | POA: Diagnosis not present

## 2020-10-22 DIAGNOSIS — D631 Anemia in chronic kidney disease: Secondary | ICD-10-CM | POA: Diagnosis not present

## 2020-10-22 DIAGNOSIS — N178 Other acute kidney failure: Secondary | ICD-10-CM | POA: Diagnosis not present

## 2020-10-22 LAB — BASIC METABOLIC PANEL
Anion gap: 11 (ref 5–15)
BUN: 42 mg/dL — ABNORMAL HIGH (ref 6–20)
CO2: 21 mmol/L — ABNORMAL LOW (ref 22–32)
Calcium: 7.5 mg/dL — ABNORMAL LOW (ref 8.9–10.3)
Chloride: 104 mmol/L (ref 98–111)
Creatinine, Ser: 3.72 mg/dL — ABNORMAL HIGH (ref 0.61–1.24)
GFR, Estimated: 18 mL/min — ABNORMAL LOW (ref >60)
Glucose, Bld: 89 mg/dL (ref 70–99)
Potassium: 3.4 mmol/L — ABNORMAL LOW (ref 3.5–5.1)
Sodium: 136 mmol/L (ref 135–145)

## 2020-10-22 LAB — CBC
HCT: 25.6 % — ABNORMAL LOW (ref 39.0–52.0)
Hemoglobin: 8 g/dL — ABNORMAL LOW (ref 13.0–17.0)
MCH: 31 pg (ref 26.0–34.0)
MCHC: 31.3 g/dL (ref 30.0–36.0)
MCV: 99.2 fL (ref 80.0–100.0)
Platelets: 332 10*3/uL (ref 150–400)
RBC: 2.58 MIL/uL — ABNORMAL LOW (ref 4.22–5.81)
RDW: 15.6 % — ABNORMAL HIGH (ref 11.5–15.5)
WBC: 6.7 10*3/uL (ref 4.0–10.5)
nRBC: 0 % (ref 0.0–0.2)

## 2020-10-22 MED ORDER — CHLORHEXIDINE GLUCONATE CLOTH 2 % EX PADS
6.0000 | MEDICATED_PAD | Freq: Every day | CUTANEOUS | Status: DC
  Administered 2020-10-22 – 2020-10-23 (×2): 6 via TOPICAL

## 2020-10-22 MED ORDER — HYDROCODONE-ACETAMINOPHEN 5-325 MG PO TABS
1.0000 | ORAL_TABLET | Freq: Four times a day (QID) | ORAL | Status: DC | PRN
  Administered 2020-10-22: 2 via ORAL
  Administered 2020-10-22: 1 via ORAL
  Administered 2020-10-23 – 2020-10-25 (×10): 2 via ORAL
  Administered 2020-10-26: 1 via ORAL
  Administered 2020-10-26: 2 via ORAL
  Filled 2020-10-22 (×14): qty 2

## 2020-10-22 NOTE — Progress Notes (Signed)
PROGRESS NOTE  UNIQUE SEARFOSS NUU:[id number] DOB: November 19, 1965 DOA: 10/06/2020 PCP: [name]  HPI/Recap of past 24 hours: This is a 55 year old male with past medical history significant for chronic kidney disease stage V history of GI bleed and also requiring gastric bypass prior stroke, Hodgkin's lymphoma, prior MI, cervical radiculopathy, chronic pain syndrome, COVID, PTSD he was admitted after being found minimally responsive. He currently has a nephrostomy tube for hydronephrosis and he started hemodialysis  October 22, 2020: Patient seen and examined at bedside he is complaining that he is hurting all over and that he is go for veteran and he has got worse syndrome he currently gets Percocet 10 mg every 8 hours he stated that is not enough she wanted to bring it closer.  He said he is in excruciating pain in his joints.  Assessment/Plan: Principal Problem:   Acute renal failure superimposed on stage 4 chronic kidney disease (HCC) Active Problems:   BPH (benign prostatic hyperplasia)   Postlaminectomy syndrome, not elsewhere classified   PTSD (post-traumatic stress disorder)   AKI (acute kidney injury) ([place])   Anxiety and depression   Anemia in chronic kidney disease   Acute metabolic encephalopathy   Acute renal failure (ARF) (HCC)   Pressure ulcer  End-stage renal disease on hemodialysis.  Patient is said to have made arrangements to have hemod[facility] at [facility] on [facility].  Followed by Monday Wednesday and Fridays  Hydronephrosis he has nephrostomy tubes in place he is on currently on trial of clamping.  He has not had no problem with the clamped nephrostomy tube. Urology plans to remove the nephrostomy tubes under fluoroscopy if he continues to do well on trial of nephrostomy clamp  Hypotension.  Patient is on midodrine he has had Levophed recently  Chronic anemia likely due to chronic disease end-stage renal disease and malnutrition. Patient received  2 units of packed RBC on October 09, 2020 Hemoglobin is stable at 8.7 today Continue apple per nephrology  Failure to thrive, severe protein calorie malnutrition Continue to trend encourage p.o. intake  Recurrent diverticulitis with abscess formation leading to complex urological obstruction Status post diverting ostomy with Hartmann resection  Chronic pain we will increase his Percocet o daily 6 hours  Code Status: DNR  Severity of Illness: The appropriate patient status for this patient is INPATIENT. Inpatient status is judged to be reasonable and necessary in order to provide the required intensity of service to ensure the patient's safety. The patient's presenting symptoms, physical exam findings, and initial radiographic and laboratory data in the context of their chronic comorbidities is felt to place them at high risk for further clinical deterioration. Furthermore, it is not anticipated that the patient will be medically stable for discharge from the hospital within 2 midnights of admission. The following factors support the patient status of inpatient.  Hydronephrosis nephrostomy tube in place pending removal* I certify that at the point of admission it is my clinical judgment that the patient will require inpatient hospital care spanning beyond 2 midnights from the point of admission due to high intensity of service, high risk for further deterioration and high frequency of surveillance required.*   Family Communication: None at bedside  Disposition Plan:   Status is: Inpatient   Dispo: The patient is from: Home              Anticipated d/c is to: Home-patient now wants to go home and do outpatient hemodialysis at the [facility]  Anticipated d/c date is: 24 hours              Patient currently not medically stable for discharge Patient was being considered for SNF but he has changed his mind he would likely be able to be discharged Monday so he will  be set up for his hemodialysis on Tuesday Consultants: Urology Nephrology Vascular  Procedures: Nephrostomy tube  Antimicrobials: Now  DVT prophylaxis: SCD   Objective: Vitals:   10/21/20 1802 10/21/20 2042 10/22/20 0557 10/22/20 1006  BP: (!) 99/56 103/63 98/60 110/67  Pulse: 74 67 (!) 59 64  Resp: 18 20 19 18   Temp: 98.5 F (36.9 C) 98.2 F (36.8 C) (!) 97.3 F (36.3 C) 98.1 F (36.7 C)  TempSrc: Oral Oral Oral Oral  SpO2: 100% 97% 99% 100%  Weight:      Height:        Intake/Output Summary (Last 24 hours) at 10/22/2020 1241 Last data filed at 10/22/2020 1012 Gross per 24 hour  Intake 840 ml  Output 2150 ml  Net -1310 ml    Filed Weights   10/19/20 2121 10/20/20 0836 10/20/20 1207  Weight: 52 kg 58.1 kg 58.7 kg   Body mass index is 20.89 kg/m.  Exam:  General: 55 y.o. year-old male well developed poorly nourished in no acute distress.  Alert and oriented x3. Cardiovascular: Regular rate and rhythm with no rubs or gallops.  No thyromegaly or JVD noted.   Respiratory: Clear to auscultation with no wheezes or rales. Good inspiratory effort. Abdomen: Soft nontender nondistended with normal bowel sounds x4 quadrants. Musculoskeletal: No lower extremity edema. 2/4 pulses in all 4 extremities. Skin: No ulcerative lesions noted or rashes, Psychiatry: Mood is appropriate for condition and setting Neurology:    Data Reviewed: CBC: Recent Labs  Lab 10/18/20 0606 10/19/20 0529 10/20/20 0818 10/21/20 1012 10/22/20 0546  WBC 4.9 5.2 6.8 7.0 6.7  HGB 7.6* 7.7* 7.6* 8.7* 8.0*  HCT 24.2* 23.8* 25.1* 27.4* 25.6*  MCV 96.4 97.1 101.6* 99.6 99.2  PLT 255 247 310 309 962    Basic Metabolic Panel: Recent Labs  Lab 10/16/20 0641 10/16/20 1920 10/17/20 0419 10/17/20 1224 10/18/20 0606 10/19/20 0529 10/20/20 0818 10/22/20 0546  NA 137  137 135 135 136 137 137 137 136  K 4.2  4.2 3.9 4.4 4.4 3.3* 2.9* 4.2 3.4*  CL 110  111 109 109 107 103 108 110 104   CO2 19*  19* 17* 16* 19* 25 21* 19* 21*  GLUCOSE 93  92 146* 94 105* 99 101* 189* 89  BUN 53*  52* 48* 51* 47* 25* 40* 44* 42*  CREATININE 6.39*  6.33* 5.93* 6.12* 5.62* 3.47* 4.64* 5.07* 3.72*  CALCIUM 6.9*  7.0* 7.1* 7.3* 7.1* 7.1* 7.0* 7.2* 7.5*  MG 2.4  --  2.2  --  2.1  --   --   --   PHOS 5.4* 5.9* 5.6* 5.0* 3.1  --  3.0  --     GFR: Estimated Creatinine Clearance: 18.4 mL/min (A) (by C-G formula based on SCr of 3.72 mg/dL (H)). Liver Function Tests: Recent Labs  Lab 10/16/20 1920 10/17/20 0419 10/17/20 1224 10/18/20 0606 10/20/20 0818  ALBUMIN 1.8* 1.7* 1.7* 1.8* 1.9*    No results for input(s): LIPASE, AMYLASE in the last 168 hours. No results for input(s): AMMONIA in the last 168 hours. Coagulation Profile: No results for input(s): INR, PROTIME in the last 168 hours. Cardiac Enzymes: No results for input(s):  CKTOTAL, CKMB, CKMBINDEX, TROPONINI in the last 168 hours. BNP (last 3 results) No results for input(s): PROBNP in the last 8760 hours. HbA1C: No results for input(s): HGBA1C in the last 72 hours. CBG: Recent Labs  Lab 10/15/20 1731  GLUCAP 115*    Lipid Profile: No results for input(s): CHOL, HDL, LDLCALC, TRIG, CHOLHDL, LDLDIRECT in the last 72 hours. Thyroid Function Tests: No results for input(s): TSH, T4TOTAL, FREET4, T3FREE, THYROIDAB in the last 72 hours. Anemia Panel: No results for input(s): VITAMINB12, FOLATE, FERRITIN, TIBC, IRON, RETICCTPCT in the last 72 hours. Urine analysis:    Component Value Date/Time   COLORURINE YELLOW 10/06/2020 1931   APPEARANCEUR TURBID (A) 10/06/2020 1931   LABSPEC 1.014 10/06/2020 1931   PHURINE 6.0 10/06/2020 1931   GLUCOSEU NEGATIVE 10/06/2020 1931   HGBUR SMALL (A) 10/06/2020 1931   BILIRUBINUR NEGATIVE 10/06/2020 1931   KETONESUR 5 (A) 10/06/2020 1931   PROTEINUR 100 (A) 10/06/2020 1931   NITRITE NEGATIVE 10/06/2020 1931   LEUKOCYTESUR MODERATE (A) 10/06/2020 1931   Sepsis  Labs: @LABRCNTIP (procalcitonin:4,lacticidven:4)  ) Recent Results (from the past 240 hour(s))  Surgical pcr screen     Status: None   Collection Time: 10/15/20  9:40 PM   Specimen: Nasal Mucosa; Nasal Swab  Result Value Ref Range Status   MRSA, PCR NEGATIVE NEGATIVE Final   Staphylococcus aureus NEGATIVE NEGATIVE Final    Comment: (NOTE) The Xpert SA Assay (FDA approved for NASAL specimens in patients 15 years of age and older), is one component of a comprehensive surveillance program. It is not intended to diagnose infection nor to guide or monitor treatment. Performed at [facility], [place] [address] [place], [place] [address]       Studies: No results found.  Scheduled Meds:  sodium chloride   Intravenous Once   calcium carbonate  2 tablet Oral TID WC   Chlorhexidine Gluconate Cloth  6 each Topical Daily   Chlorhexidine Gluconate Cloth  6 each Topical Q0600   Chlorhexidine Gluconate Cloth  6 each Topical Q0600   darbepoetin (ARANESP) injection - DIALYSIS  200 mcg Subcutaneous Q7 days   mouth rinse  15 mL Mouth Rinse BID   midodrine  10 mg Oral Daily   multivitamin with minerals  1 tablet Oral BID   pantoprazole  40 mg Oral Q1200   sodium chloride flush  5 mL Intracatheter Q8H    Continuous Infusions:  sodium chloride 250 mL (10/09/20 1732)   norepinephrine (LEVOPHED) Adult infusion Stopped (10/08/20 0401)     LOS: 16 days     Cristal Deer, MD [facility]  To reach me or the doctor on call, go to: [email or web address] Password [facility]  10/22/2020, 12:41 PM

## 2020-10-22 NOTE — Progress Notes (Signed)
[facility] Progress Note  Name: Chase [name] MRN: [id number] DOB: [birth year]    Subjective: upset because his pain med was taken from q 4 to q 8-  always seems to be upset about something.  No details on discharge avail-  possibly has dialysis set up at [facility] on 7/26 but MWF ?? But have not heard officially about discharge    ------------- Background on referral:  56 y.o. 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  He was supposed to have a TURP  but didn't comply.  He was then found to be unresponsive on a welfare check.   We last saw him in 01/02/20 inpatient and he was lost to follow-up with [facility] and nephrology at the [facility].  He has had interim hospitalizations including one with CRRT.  At a SNF at one point but most recently at home.     Dr. Royce Macadamia had an extensive conversation with the patient, his son (present via phone), and his nurse.  We discussed the risks/benefits/indications of dialysis/RRT.  They do wish to pursue dialysis and consent for renal replacement therapy.  The patient wishes it hadn't come to this but is not ready to transition to comfort care.  He wants to live independently but his son discussed that that hadn't been going well.   Intake/Output Summary (Last 24 hours) at 10/22/2020 0931 Last data filed at 10/22/2020 0552 Gross per 24 hour  Intake 600 ml  Output 2350 ml  Net -1750 ml    Vitals:  Vitals:   10/21/20 1132 10/21/20 1802 10/21/20 2042 10/22/20 0557  BP: (!) 94/53 (!) 99/56 103/63 98/60  Pulse: (!) 53 74 67 (!) 59  Resp: 18 18 20 19   Temp: 98.3 F (36.8 C) 98.5 F (36.9 C) 98.2 F (36.8 C) (!) 97.3 F (36.3 C)  TempSrc: Oral Oral Oral Oral  SpO2: 99% 100% 97% 99%  Weight:      Height:         Physical Exam:  General: thin chronically ill appearing gentleman  HEENT: [place] AT Eyes: EOMI, anicteric Neck: supple trachea midline Heart: RRR on  monitor Lungs: normal WOB on RA Extremities: no edema  GU foley in place Access: - new TDC and left upper arm AVF wit thrill and bruit  Medications reviewed   Labs:  BMP Latest Ref Rng & Units 10/22/2020 10/20/2020 10/19/2020  Glucose 70 - 99 mg/dL 89 189(H) 101(H)  BUN 6 - 20 mg/dL 42(H) 44(H) 40(H)  Creatinine 0.61 - 1.24 mg/dL 3.72(H) 5.07(H) 4.64(H)  Sodium 135 - 145 mmol/L 136 137 137  Potassium 3.5 - 5.1 mmol/L 3.4(L) 4.2 2.9(L)  Chloride 98 - 111 mmol/L 104 110 108  CO2 22 - 32 mmol/L 21(L) 19(L) 21(L)  Calcium 8.9 - 10.3 mg/dL 7.5(L) 7.2(L) 7.0(L)     Assessment/Plan:   # 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  Presented with severe AKI requiring CRRT 7/9. Had BL hydronephrosis despite foley and ureteral stents so underwent nephrostomy tube placement.  Despite decompression with good UOP 24h CrCl 3 consistent with ESRD.  - Off CRRT 7/11, had HD for clearance 7/13; declined HD 7/16 -  then done on 7/19 and 7/22.  Had been doing TTS but last was on Friday-- Appreicate VVS consult re: perm access and [facility]  has been done- new left upper arm AVF  on 7/18 -Outpt CLIP underway -  he wants to do dialysis at the [place] in [place]-   is in the works -  I see note from progression that he has spot at [facility] can start on 7/26 but will be MWF-  not sure when that means will be discharged ? Just to be safe, will do HD here tomorrow- to keep on this MWF schedule.    # Chronic urinary retention: missed scheduled TURP per charting - s/p BL nephrostomy tubes this admission - plan per urology for nephrostomy tubes --> given committed to dialysis now plan will be maintain comfort and avoid infection-  looking to internalize/remove prior to discharge ?   # Hx CVA - left hemiparesis - previously at SNF and noted he presented from home most recently   # Anemia - normocytic.  Has rec'd 2u pRBC.   Started ESA 7/11. have  increased dose-  s/p transfusion 7/19 with HD  #BMM: PTH 64 , corr ca a bit low - supplement, phos ok no binder.    Volume- makes normal urine-   does not need fluid restriction or UF with HD  Insists upon return home vs SNF - concern expressed about feasibility of plan but he insists with home health and [facility] transport to dialysis in [place]-  renal navigator is aware possibly has OP HD spot starting Tuesday 7/26 but with MWF schedule.    Louis Meckel, MD 10/22/2020 9:31 AM

## 2020-10-23 ENCOUNTER — Inpatient Hospital Stay (HOSPITAL_COMMUNITY): Payer: No Typology Code available for payment source

## 2020-10-23 DIAGNOSIS — N184 Chronic kidney disease, stage 4 (severe): Secondary | ICD-10-CM | POA: Diagnosis not present

## 2020-10-23 DIAGNOSIS — N178 Other acute kidney failure: Secondary | ICD-10-CM | POA: Diagnosis not present

## 2020-10-23 HISTORY — PX: IR NEPHRO TUBE REMOV/FL: IMG2342

## 2020-10-23 LAB — RESP PANEL BY RT-PCR (FLU A&B, COVID) ARPGX2
Influenza A by PCR: NEGATIVE
Influenza B by PCR: NEGATIVE
SARS Coronavirus 2 by RT PCR: NEGATIVE

## 2020-10-23 MED ORDER — MORPHINE SULFATE (PF) 2 MG/ML IV SOLN
2.0000 mg | INTRAVENOUS | Status: DC | PRN
  Administered 2020-10-23 – 2020-10-26 (×13): 2 mg via INTRAVENOUS
  Filled 2020-10-23 (×14): qty 1

## 2020-10-23 MED ORDER — LIDOCAINE HCL 1 % IJ SOLN
INTRAMUSCULAR | Status: AC
  Filled 2020-10-23: qty 20

## 2020-10-23 MED ORDER — IOHEXOL 300 MG/ML  SOLN
50.0000 mL | Freq: Once | INTRAMUSCULAR | Status: AC | PRN
  Administered 2020-10-23: 10 mL

## 2020-10-23 MED ORDER — POTASSIUM CHLORIDE CRYS ER 20 MEQ PO TBCR
20.0000 meq | EXTENDED_RELEASE_TABLET | Freq: Once | ORAL | Status: AC
  Administered 2020-10-23: 20 meq via ORAL
  Filled 2020-10-23: qty 1

## 2020-10-23 NOTE — Progress Notes (Signed)
Contacted provider regarding patient stated pain currently 10  next PRN dose not due until 10am.

## 2020-10-23 NOTE — Progress Notes (Signed)
Patient ID: Chase [name], male   DOB: [birth year], 55 y.o.   MRN: [id number]   Pt has continued to produce adequate UOP with bilateral PCNs clamped over the weekend.  Discussed with Dr. Serafina Royals (IR) and will plan for nephrostomy tube removal under fluoroscopy prior to discharge.  I will arrange outpatient follow up for early/mid August to change his urethral catheter and plan for next ureteral stent change.

## 2020-10-23 NOTE — TOC Progression Note (Addendum)
Transition of Care [facility]) - Progression Note    Patient Details  Name: Chase [name] MRN: [id number] Date of Birth: 1966/01/29  Transition of Care [facility]) CM/SW [place], [place] Phone Number: 10/23/2020, 10:26 AM  Clinical Narrative:    CSW spoke with [place] from [place] HD.  Pt will need a covid test before discharging . Pt will start HD with [facility] on Wednesday since pt will receive HD with hospitial. CSW sent  secure chat to physician & RN asking for covid test for pt.    Expected Discharge Plan: [place] Barriers to Discharge: Continued Medical Work up  Expected Discharge Plan and Services Expected Discharge Plan: [place]   Discharge Planning Services: CM Consult Post Acute Care Choice: [place] arrangements for the past 2 months: Single Family Home                           HH Arranged: RN, PT, OT, Nurse's Aide HH Agency: [place] Date [facility] Agency Contacted: 10/12/20 Time [place]: 1200 Representative spoke with at [place]: [place] ([place]) Interventions    Readmission Risk Interventions Readmission Risk Prevention Plan 07/04/2020 12/25/2019 11/19/2019  Transportation Screening Complete Complete Complete  Medication Review Press photographer) Complete Complete Complete  PCP or Specialist appointment within 3-5 days of discharge Complete - Complete  HRI or Home Care Consult Complete - Complete  SW Recovery Care/Counseling Consult Complete - Complete  Palliative Care Screening Not Applicable - Not Applicable  Skilled Nursing Facility Not Applicable - Complete  Some recent data might be hidden

## 2020-10-23 NOTE — Progress Notes (Signed)
PROGRESS NOTE    Chase [name]  URK:[id number] DOB: 08-Dec-1965 DOA: 10/06/2020 PCP: [name]   Chief Complain: AMS  Brief Narrative:  This is a 55 year old male with past medical history significant for chronic kidney disease stage V history of GI bleed and also requiring gastric bypass prior stroke, Hodgkin's lymphoma, prior MI, cervical radiculopathy, chronic pain syndrome, COVID, PTSD he was admitted after being found minimally responsive. Nephrology was consulted after admission and he was undergoing dialysis.  He has a prolonged hospital stay.  He has been set up for dialysis as an outpatient at [facility] urology/IR planning for nephrostomy tube removal because he is having adequate urine output. Plan for discharge tomorrow after procedure by IR  Assessment & Plan:   Principal Problem:   Acute renal failure superimposed on stage 4 chronic kidney disease (HCC) Active Problems:   BPH (benign prostatic hyperplasia)   Postlaminectomy syndrome, not elsewhere classified   PTSD (post-traumatic stress disorder)   AKI (acute kidney injury) ([place])   Anxiety and depression   Anemia in chronic kidney disease   Acute metabolic encephalopathy   Acute renal failure (ARF) (HCC)   Pressure ulcer   End-stage renal disease  on hemodialysis.  Arranged  hemodialysis at the [facility] as per nephrology.  Plan for MWF  Hydronephrosis /chronic urinary retention he has nephrostomy tubes in place . Urology plans to remove the nephrostomy tubes under fluoroscopy either today or tomorrow  Hypotension.   Patient is on midodrine   Chronic anemia  likely due to chronic disease end-stage renal disease and malnutrition. Patient received 2 units of packed RBC on October 09, 2020 Hemoglobin is stable in the range of 8   Failure to thrive, severe protein calorie malnutrition Continue to  encourage p.o. 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  Previously was at SNF  Disposition Plan is to discharge to home with home health.  Nutrition Problem: Severe Malnutrition Etiology: chronic illness (hydronephrosis, gastric bypass)      DVT prophylaxis:Heparin [place] Code Status: DNR Family Communication: None at bedside Status is: Inpatient  Remains inpatient appropriate because:Unsafe d/c plan  Dispo: The patient is from: Home              Anticipated d/c is to: Home              Patient currently is not medically stable to d/c.   Difficult to place patient No   Consultants: Nephrology, IR, urology  Procedures: Dialysis  Antimicrobials:  Anti-infectives (From admission, onward)    Start     Dose/Rate Route Frequency Ordered Stop   10/16/20 0600  ceFAZolin (ANCEF) IVPB 1 g/50 mL premix        1 g 100 mL/hr over 30 Minutes Intravenous To Short Stay 10/15/20 1002 10/16/20 1244   10/08/20 1135  cefTRIAXone (ROCEPHIN) 2 g in sodium chloride 0.9 % 100 mL IVPB        over 30 Minutes  Continuous PRN 10/08/20 1147 10/08/20 1135   10/08/20 1133  sodium chloride 0.9 % with cefTRIAXone (ROCEPHIN) ADS Med       Note to Pharmacy: Armando Reichert   : cabinet override      10/08/20 1133 10/08/20 2344   10/07/20 2200  piperacillin-tazobactam (ZOSYN) IVPB 3.375 g  Status:  Discontinued  3.375 g 12.5 mL/hr over 240 Minutes Intravenous Every 12 hours 10/07/20 1155 10/07/20 1728   10/07/20 2200  piperacillin-tazobactam (ZOSYN) IVPB 3.375 g        3.375 g 12.5 mL/hr over 240 Minutes Intravenous Every 8 hours 10/07/20 1728 10/10/20 0129   10/07/20 1815  piperacillin-tazobactam (ZOSYN) IVPB 3.375 g        3.375 g 12.5 mL/hr over 240 Minutes Intravenous  Once 10/07/20 1728 10/07/20 2222   10/07/20 1200  piperacillin-tazobactam (ZOSYN) IVPB 3.375 g  Status:   Discontinued        3.375 g 100 mL/hr over 30 Minutes Intravenous  Once 10/07/20 1155 10/07/20 1728   10/07/20 1045  Ampicillin-Sulbactam (UNASYN) 3 g in sodium chloride 0.9 % 100 mL IVPB  Status:  Discontinued        3 g 200 mL/hr over 30 Minutes Intravenous Every 24 hours 10/07/20 1022 10/07/20 1155   10/06/20 1900  cefTRIAXone (ROCEPHIN) 1 g in sodium chloride 0.9 % 100 mL IVPB        1 g 200 mL/hr over 30 Minutes Intravenous  Once 10/06/20 1855 10/06/20 2015       Subjective: Patient seen and examined at the bedside this morning.  Hemodynamically stable.  Comfortable, lying on the bed.  Denies any complaints.  He is aware that he will be going home tomorrow after the removal of nephrostomy tube.  Objective: Vitals:   10/22/20 1742 10/22/20 2012 10/23/20 0405 10/23/20 1003  BP: 112/65 98/62 102/62 103/62  Pulse: 71 71 (!) 59 68  Resp: 19  17 16   Temp: 97.8 F (36.6 C) 97.9 F (36.6 C) 97.8 F (36.6 C) 99 F (37.2 C)  TempSrc: Oral Oral Oral Oral  SpO2: 100% 96% 99% 100%  Weight:      Height:        Intake/Output Summary (Last 24 hours) at 10/23/2020 1137 Last data filed at 10/23/2020 0544 Gross per 24 hour  Intake 240 ml  Output 1550 ml  Net -1310 ml   Filed Weights   10/19/20 2121 10/20/20 0836 10/20/20 1207  Weight: 52 kg 58.1 kg 58.7 kg    Examination:  General exam: Overall comfortable, not in distress, chronically ill looking, thin built HEENT: PERRL Respiratory system:  no wheezes or crackles  Cardiovascular system: S1 & S2 heard, RRR.  Temporary dialysis catheter on the right chest Gastrointestinal system: Abdomen is nondistended, soft and nontender. Bilateral nephrostomy tubes on the back Central nervous system: Alert and oriented Extremities: No edema, no clubbing ,no cyanosis, left upper arm AV fistula Skin: No rashes, no ulcers,no icterus      Data Reviewed: I have personally reviewed following labs and imaging studies  CBC: Recent Labs  Lab  10/18/20 0606 10/19/20 0529 10/20/20 0818 10/21/20 1012 10/22/20 0546  WBC 4.9 5.2 6.8 7.0 6.7  HGB 7.6* 7.7* 7.6* 8.7* 8.0*  HCT 24.2* 23.8* 25.1* 27.4* 25.6*  MCV 96.4 97.1 101.6* 99.6 99.2  PLT 255 247 310 309 322   Basic Metabolic Panel: Recent Labs  Lab 10/16/20 1920 10/17/20 0419 10/17/20 1224 10/18/20 0606 10/19/20 0529 10/20/20 0818 10/22/20 0546  NA 135 135 136 137 137 137 136  K 3.9 4.4 4.4 3.3* 2.9* 4.2 3.4*  CL 109 109 107 103 108 110 104  CO2 17* 16* 19* 25 21* 19* 21*  GLUCOSE 146* 94 105* 99 101* 189* 89  BUN 48* 51* 47* 25* 40* 44* 42*  CREATININE 5.93* 6.12* 5.62* 3.47* 4.64*  5.07* 3.72*  CALCIUM 7.1* 7.3* 7.1* 7.1* 7.0* 7.2* 7.5*  MG  --  2.2  --  2.1  --   --   --   PHOS 5.9* 5.6* 5.0* 3.1  --  3.0  --    GFR: Estimated Creatinine Clearance: 18.4 mL/min (A) (by C-G formula based on SCr of 3.72 mg/dL (H)). Liver Function Tests: Recent Labs  Lab 10/16/20 1920 10/17/20 0419 10/17/20 1224 10/18/20 0606 10/20/20 0818  ALBUMIN 1.8* 1.7* 1.7* 1.8* 1.9*   No results for input(s): LIPASE, AMYLASE in the last 168 hours. No results for input(s): AMMONIA in the last 168 hours. Coagulation Profile: No results for input(s): INR, PROTIME in the last 168 hours. Cardiac Enzymes: No results for input(s): CKTOTAL, CKMB, CKMBINDEX, TROPONINI in the last 168 hours. BNP (last 3 results) No results for input(s): PROBNP in the last 8760 hours. HbA1C: No results for input(s): HGBA1C in the last 72 hours. CBG: No results for input(s): GLUCAP in the last 168 hours. Lipid Profile: No results for input(s): CHOL, HDL, LDLCALC, TRIG, CHOLHDL, LDLDIRECT in the last 72 hours. Thyroid Function Tests: No results for input(s): TSH, T4TOTAL, FREET4, T3FREE, THYROIDAB in the last 72 hours. Anemia Panel: No results for input(s): VITAMINB12, FOLATE, FERRITIN, TIBC, IRON, RETICCTPCT in the last 72 hours. Sepsis Labs: No results for input(s): PROCALCITON, LATICACIDVEN in the  last 168 hours.  Recent Results (from the past 240 hour(s))  Surgical pcr screen     Status: None   Collection Time: 10/15/20  9:40 PM   Specimen: Nasal Mucosa; Nasal Swab  Result Value Ref Range Status   MRSA, PCR NEGATIVE NEGATIVE Final   Staphylococcus aureus NEGATIVE NEGATIVE Final    Comment: (NOTE) The Xpert SA Assay (FDA approved for NASAL specimens in patients 58 years of age and older), is one component of a comprehensive surveillance program. It is not intended to diagnose infection nor to guide or monitor treatment. Performed at [facility], [place] [address], [place], [place] [address]          Radiology Studies: No results found.      Scheduled Meds:  sodium chloride   Intravenous Once   calcium carbonate  2 tablet Oral TID WC   Chlorhexidine Gluconate Cloth  6 each Topical Daily   Chlorhexidine Gluconate Cloth  6 each Topical Q0600   Chlorhexidine Gluconate Cloth  6 each Topical Q0600   darbepoetin (ARANESP) injection - DIALYSIS  200 mcg Subcutaneous Q7 days   mouth rinse  15 mL Mouth Rinse BID   midodrine  10 mg Oral Daily   multivitamin with minerals  1 tablet Oral BID   pantoprazole  40 mg Oral Q1200   sodium chloride flush  5 mL Intracatheter Q8H   Continuous Infusions:  sodium chloride 250 mL (10/09/20 1732)   norepinephrine (LEVOPHED) Adult infusion Stopped (10/08/20 0401)     LOS: 17 days    Time spent: 25 mins.More than 50% of that time was spent in counseling and/or coordination of care.      Shelly Coss, MD [facility] P7/25/2022, 11:37 AM

## 2020-10-23 NOTE — Progress Notes (Signed)
[facility] Progress Note     Assessment/ Plan:   # 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  Presented with severe AKI requiring CRRT 7/9. Had BL hydronephrosis despite foley and ureteral stents so underwent nephrostomy tube placement.  Despite decompression with good UOP 24h CrCl 3 consistent with ESRD. - Off CRRT 7/11, had HD for clearance 7/13; declined HD 7/16 -  then done on 7/19 and 7/22.  Had been doing TTS but last was on Friday-- Appreicate VVS consult re: perm access and [facility]  has been done- new left upper arm BBF on 7/18 -Outpt CLIP underway -  he wants to do dialysis at the [place] in [place]-  is in the works -   he appears to have a spot at [facility]  but will be MWF so will HD here today and then d/c per primary   # Chronic urinary retention: missed scheduled TURP per charting - s/p BL nephrostomy tubes this admission - plan per urology for nephrostomy tubes --> given committed to dialysis now plan will be maintain comfort and avoid infection-  looking to internalize/remove prior to discharge ?   # Hx CVA - left hemiparesis - previously at SNF and noted he presented from home most recently   # Anemia - normocytic.  Has rec'd 2u pRBC.   Started ESA 7/11. have increased dose-  s/p transfusion 7/19 with HD   #BMM: PTH 64 , corr ca a bit low - supplement, phos ok no binder.     Volume- makes normal urine-   does not need fluid restriction or UF with HD   Insists upon return home vs SNF - concern expressed about feasibility of plan but he insists with home health and [facility] dialysis in [place]-  renal navigator is aware possibly has OP HD spot starting Tuesday 7/26 but with MWF schedule (see above).    Subjective:   C/o pain all over but denies nausea, SOB, fevers.   Objective:   BP 102/62 (BP Location: Right Arm)   Pulse (!) 59   Temp 97.8 F (36.6 C) (Oral)   Resp 17    Ht 5\' 6"  (1.676 m)   Wt 58.7 kg   SpO2 99%   BMI 20.89 kg/m   Intake/Output Summary (Last 24 hours) at 10/23/2020 9562 Last data filed at 10/23/2020 0544 Gross per 24 hour  Intake 480 ml  Output 1550 ml  Net -1070 ml   Weight change:   Physical Exam: General: thin chronically ill appearing gentleman HEENT: [place] AT Eyes: EOMI, anicteric Neck: supple trachea midline Heart: RRR on monitor Lungs: normal WOB on RA Extremities: no edema  GU foley in place Access: - new RIJ TDC and left upper arm AVF (BBF) with thrill and bruit  Imaging: No results found.  Labs: BMET Recent Labs  Lab 10/16/20 1920 10/17/20 0419 10/17/20 1224 10/18/20 0606 10/19/20 0529 10/20/20 0818 10/22/20 0546  NA 135 135 136 137 137 137 136  K 3.9 4.4 4.4 3.3* 2.9* 4.2 3.4*  CL 109 109 107 103 108 110 104  CO2 17* 16* 19* 25 21* 19* 21*  GLUCOSE 146* 94 105* 99 101* 189* 89  BUN 48* 51* 47* 25* 40* 44* 42*  CREATININE 5.93* 6.12* 5.62* 3.47* 4.64* 5.07* 3.72*  CALCIUM 7.1* 7.3* 7.1* 7.1* 7.0* 7.2* 7.5*  PHOS 5.9* 5.6* 5.0* 3.1  --  3.0  --  CBC Recent Labs  Lab 10/19/20 0529 10/20/20 0818 10/21/20 1012 10/22/20 0546  WBC 5.2 6.8 7.0 6.7  HGB 7.7* 7.6* 8.7* 8.0*  HCT 23.8* 25.1* 27.4* 25.6*  MCV 97.1 101.6* 99.6 99.2  PLT 247 310 309 332    Medications:     sodium chloride   Intravenous Once   calcium carbonate  2 tablet Oral TID WC   Chlorhexidine Gluconate Cloth  6 each Topical Daily   Chlorhexidine Gluconate Cloth  6 each Topical Q0600   Chlorhexidine Gluconate Cloth  6 each Topical Q0600   darbepoetin (ARANESP) injection - DIALYSIS  200 mcg Subcutaneous Q7 days   mouth rinse  15 mL Mouth Rinse BID   midodrine  10 mg Oral Daily   multivitamin with minerals  1 tablet Oral BID   pantoprazole  40 mg Oral Q1200   sodium chloride flush  5 mL Intracatheter Q8H      Otelia Santee, MD 10/23/2020, 7:12 AM

## 2020-10-23 NOTE — Progress Notes (Signed)
Interventional Radiology Brief Note:  Dr. Serafina Royals has discussed with Urology.  Plan made for bilateral PCN removal under fluoro today vs. Tomorrow as IR schedule allows.  Note plans for upcoming discharge.   Brynda Greathouse, MS RD PA-C 11:21 AM

## 2020-10-23 NOTE — Progress Notes (Addendum)
Patient had called for breakfast and asked for a bacon egg and cheese quesidilla and kitchen said they can not make it.  He did not order anything and told them woulkd talk to nurse

## 2020-10-23 NOTE — Progress Notes (Signed)
Physical Therapy Treatment Patient Details Name: Chase [name] MRN: [id number] DOB: 08/31/1965 Today's Date: 10/23/2020    History of Present Illness Pt is 55 yo male who presented to the ED via EMS after being found minimally responsive during a welfare check. PMHx significant for CKD stage IV, hx of GIB and ulcers requiring gastric bypass, Hx of CVA, hodgkin's lymphoma, prior MI, cervical radiculopathy, chronic pain syndrome, COVID, and PTSD.    PT Comments    Pt only agreeable to work with therapy to get up to chair for lunch. Pt is making progress towards his goals and is min Ax2 for bed mobility and min guard for coming to standing in SNF. Pt declines attempt to walk to chair. PT will continue to try to progress pt mobility, within his pain tolerance. D/c plan remains appropriate at this time.     Follow Up Recommendations  Home health PT;Other (comment);Supervision - Intermittent (pt refuses SNF, encouraging HHAide through [place] however pt wants some days to his self.)     Equipment Recommendations  None recommended by PT (has necessary equipment)       Precautions / Restrictions Precautions Precautions: Fall Restrictions Weight Bearing Restrictions: No    Mobility  Bed Mobility Overal bed mobility: Needs Assistance Bed Mobility: Supine to Sit;Sit to Supine     Supine to sit: Min assist;+2 for physical assistance     General bed mobility comments: min Ax2 with HoB elevated to pivot hips to EoB, pt able to bring trunk to upright    Transfers Overall transfer level: Needs assistance   Transfers: Sit to/from Stand Sit to Stand: Min guard         General transfer comment: minguard for safety, good power up and self steady        Balance Overall balance assessment: Needs assistance Sitting-balance support: Feet supported;Bilateral upper extremity supported Sitting balance-Leahy Scale: Fair     Standing balance support: Bilateral upper extremity  supported Standing balance-Leahy Scale: Fair Standing balance comment: using Stedy bar                            Cognition Arousal/Alertness: Awake/alert Behavior During Therapy: Flat affect;WFL for tasks assessed/performed Overall Cognitive Status: Within Functional Limits for tasks assessed                                 General Comments: pt continues to have poor awareness of his deficits         General Comments General comments (skin integrity, edema, etc.): Pt only agreeable to OOB to eat his lunch, pt with c/o of itchiness and discomfort at nephrostomy sites      Pertinent Vitals/Pain Pain Assessment: Faces Faces Pain Scale: Hurts a little bit Pain Location: generalized Pain Descriptors / Indicators: Discomfort;Grimacing Pain Intervention(s): Limited activity within patient's tolerance;Monitored during session;Repositioned     PT Goals (current goals can now be found in the care plan section) Acute Rehab PT Goals Patient Stated Goal: to go home from the hospital PT Goal Formulation: With patient Time For Goal Achievement: 10/26/20 Potential to Achieve Goals: Poor Progress towards PT goals: Progressing toward goals    Frequency    Min 3X/week      PT Plan Current plan remains appropriate       AM-PAC PT "6 Clicks" Mobility   Outcome Measure  Help needed turning from your back  to your side while in a flat bed without using bedrails?: A Little Help needed moving from lying on your back to sitting on the side of a flat bed without using bedrails?: A Little Help needed moving to and from a bed to a chair (including a wheelchair)?: Total Help needed standing up from a chair using your arms (e.g., wheelchair or bedside chair)?: Total Help needed to walk in hospital room?: Total Help needed climbing 3-5 steps with a railing? : Total 6 Click Score: 10    End of Session   Activity Tolerance: Patient tolerated treatment well Patient  left: with call bell/phone within reach;in chair;with chair alarm set Nurse Communication: Mobility status PT Visit Diagnosis: Muscle weakness (generalized) (M62.81);Repeated falls (R29.6);History of falling (Z91.81);Unsteadiness on feet (R26.81);Other abnormalities of gait and mobility (R26.89);Difficulty in walking, not elsewhere classified (R26.2);Hemiplegia and hemiparesis;Pain Hemiplegia - Right/Left: Left Hemiplegia - dominant/non-dominant: Non-dominant Hemiplegia - caused by: Cerebral infarction Pain - Right/Left:  (bilateral) Pain - part of body:  (kidneys and back)     Time: 1209-1222 PT Time Calculation (min) (ACUTE ONLY): 13 min  Charges:  $Therapeutic Activity: 8-22 mins                     Hilmar Moldovan B. Migdalia Dk PT, DPT Acute Rehabilitation Services Pager [phone] Office [phone]    [place] 10/23/2020, 2:56 PM

## 2020-10-24 DIAGNOSIS — N178 Other acute kidney failure: Secondary | ICD-10-CM | POA: Diagnosis not present

## 2020-10-24 DIAGNOSIS — N184 Chronic kidney disease, stage 4 (severe): Secondary | ICD-10-CM | POA: Diagnosis not present

## 2020-10-24 LAB — RENAL FUNCTION PANEL
Albumin: 1.9 g/dL — ABNORMAL LOW (ref 3.5–5.0)
Anion gap: 4 — ABNORMAL LOW (ref 5–15)
BUN: 59 mg/dL — ABNORMAL HIGH (ref 6–20)
CO2: 21 mmol/L — ABNORMAL LOW (ref 22–32)
Calcium: 7.4 mg/dL — ABNORMAL LOW (ref 8.9–10.3)
Chloride: 113 mmol/L — ABNORMAL HIGH (ref 98–111)
Creatinine, Ser: 4.79 mg/dL — ABNORMAL HIGH (ref 0.61–1.24)
GFR, Estimated: 13 mL/min — ABNORMAL LOW (ref >60)
Glucose, Bld: 93 mg/dL (ref 70–99)
Phosphorus: 1.4 mg/dL — ABNORMAL LOW (ref 2.5–4.6)
Potassium: 3.8 mmol/L (ref 3.5–5.1)
Sodium: 138 mmol/L (ref 135–145)

## 2020-10-24 MED ORDER — MORPHINE SULFATE (PF) 2 MG/ML IV SOLN
INTRAVENOUS | Status: AC
  Filled 2020-10-24: qty 1

## 2020-10-24 MED ORDER — HEPARIN SODIUM (PORCINE) 1000 UNIT/ML IJ SOLN
INTRAMUSCULAR | Status: AC
  Filled 2020-10-24: qty 4

## 2020-10-24 MED ORDER — MIDODRINE HCL 10 MG PO TABS: 10.0000 mg | ORAL_TABLET | Freq: Every day | ORAL | 0 refills | Status: AC

## 2020-10-24 NOTE — Progress Notes (Signed)
Palliative: Chart reviewed.  Chase [name] goals are set for home with home health services as warranted, [facility] [place] [facility].  He has declined outpatient palliative services.  PMT to shadow for declines.  Plan: Continue to treat the treatable but no CPR or intubation.  Chase [name] desires to return to his own home with home health services as warranted.  Outpatient dialysis with the [place] in [place].  Follow-up with nephrology/urology outpatient.  Declines out patient palliative services.  No charge  [name], NP Palliative medicine team Team phone [phone] Greater than 50% of this time was spent counseling and coordinating care related to the above assessment and plan.

## 2020-10-24 NOTE — TOC Progression Note (Addendum)
Transition of Care [facility]) - Progression Note    Patient Details  Name: Chase [name] MRN: [id number] Date of Birth: 06-Jun-1965  Transition of Care [facility]) CM/SW [place], [place] Phone Number: 10/24/2020, 2:54 PM  Clinical Narrative:    CSW contacted pt's son Haneef Hallquist, [phone].  Pt's son states that pt's family lives out of state accepted pt's brother however he is leaving to go out of state.  According to pt's son there is no one who can meet PTAR.  CSW contacted NCM Kristi to update.  TOC will continue to assist with disposition planning.   Expected Discharge Plan: [place] Barriers to Discharge: Barriers Resolved  Expected Discharge Plan and Services Expected Discharge Plan: [place] In-house Referral: Clinical Social Work Discharge Planning Services: CM Consult Post Acute Care Choice: [place] arrangements for the past 2 months: Single Family Home Expected Discharge Date: 10/24/20               DME Arranged: N/A DME Agency: NA       HH Arranged: RN, PT, OT, Nurse's Aide [place] Agency: [place] Date HH Agency Contacted: 10/12/20 Time [place]: 1200 Representative spoke with at [place]: [place] ([place]) Interventions    Readmission Risk Interventions Readmission Risk Prevention Plan 10/24/2020 07/04/2020 12/25/2019  Transportation Screening Complete Complete Complete  Medication Review Press photographer) Complete Complete Complete  PCP or Specialist appointment within 3-5 days of discharge Complete Complete -  [place] or Home Care Consult Complete Complete -  SW Recovery Care/Counseling Consult Complete Complete -  Palliative Care Screening Complete Not Applicable -  [place] Patient Refused Not Applicable -  Some recent data might be hidden

## 2020-10-24 NOTE — Progress Notes (Signed)
Attempted to get patient for dialysis x2. Will check back.

## 2020-10-24 NOTE — Progress Notes (Signed)
OT Cancellation Note  Patient Details Name: Chase [name] MRN: [id number] DOB: [birth year]   Cancelled Treatment:    Reason Eval/Treat Not Completed: Patient at procedure or test/ unavailable;Other (comment). Pt refused with first attempt by OT this morning then pt at HD when OT returned to try again.   Emmit [facility] 10/24/2020, 12:27 PM

## 2020-10-24 NOTE — Progress Notes (Addendum)
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  Presented with severe AKI requiring CRRT 7/9. Had BL hydronephrosis despite foley and ureteral stents so underwent nephrostomy tube placement.  Despite decompression with good UOP 24h CrCl 3 consistent with ESRD. - Off CRRT 7/11, had HD for clearance 7/13; declined HD 7/16 -  then done on 7/19 and 7/22.  Had been doing TTS but last was on Friday-- Appreicate [facility] consult re: perm access and [facility]  has been done- new left upper arm BBF on 7/18 -Outpt CLIP underway -  he wants to do dialysis at the [place] in [place]   - He has a spot MWF  1230pm (2nd shift)   Unfortunately bec of staffing unable to HD yesterday; he is on the schedule for today  He was giving transport an issue bec tray was not moved for him to have breakfast this am. If he refuses to go to HD this am then ok to receive next hd at [facility] tomorrow.   # Chronic urinary retention: missed scheduled TURP per charting - s/p BL nephrostomy tubes this admission - plan per urology for nephrostomy tubes --> given committed to dialysis now plan will be maintain comfort and avoid infection-  looking to internalize/remove prior to discharge ?   # Hx CVA - left hemiparesis - previously at SNF and noted he presented from home most recently   # Anemia - normocytic.  Has rec'd 2u pRBC.   Started ESA 7/11. have increased dose-  s/p transfusion 7/19 with HD   #BMM: PTH 64 , corr ca a bit low - supplement, phos ok no binder.     Volume- makes normal urine-   does not need fluid restriction or UF with HD   Insists upon return home vs SNF - concern expressed about feasibility of plan but he insists with home health and [facility] in [place]-  renal navigator is aware possibly has OP HD spot starting Tuesday 7/26 but with MWF schedule (see above).     Subjective:   C/o pain all over but denies nausea, SOB, fevers. Hungry this am and unhappy that tray not moved over and c/o pain meds not given at 730 this am.   Objective:   BP 99/62 (BP Location: Right Arm)   Pulse (!) 55   Temp 97.8 F (36.6 C) (Oral)   Resp 18   Ht 5\' 6"  (1.676 m)   Wt 58.7 kg   SpO2 100%   BMI 20.89 kg/m   Intake/Output Summary (Last 24 hours) at 10/24/2020 0734 Last data filed at 10/23/2020 2000 Gross per 24 hour  Intake 480 ml  Output 1200 ml  Net -720 ml   Weight change:   Physical Exam: General: thin chronically ill appearing gentleman HEENT: [place] AT Eyes: EOMI, anicteric Neck: supple trachea midline Heart: RRR on monitor Lungs: normal WOB on RA Extremities: no edema  GU foley in place Access: - new RIJ TDC and left upper arm AVF (BBF) with thrill and bruit  Imaging: IR Nephro Tube Remov/FL  Result Date: 10/23/2020 INDICATION: 55 year old male with history of prior ureteral obstruction now with functioning indwelling bilateral double-J ureteral stents. EXAM: Bilateral nephrostomy tube removal. COMPARISON:  None. MEDICATIONS: None. ANESTHESIA/SEDATION: None. CONTRAST:  10 mL Omnipaque 300-administered into the collecting system(s) FLUOROSCOPY TIME:  Fluoroscopy Time:  0 minutes 36 seconds (2 mGy). COMPLICATIONS: None immediate. PROCEDURE: Informed written consent was obtained from the patient after a thorough discussion of the procedural risks, benefits and alternatives. All questions were addressed. Maximal Sterile Barrier Technique was utilized including caps, mask, sterile gowns, sterile gloves, sterile drape, hand hygiene and skin antiseptic. A timeout was performed prior to the initiation of the procedure. Gentle hand injection of contrast via the bilateral nephrostomy tubes demonstrated patency with no evidence of hydronephrosis. Each tube was cut to release the pigtail string and removed over a wire without complication under direct fluoroscopic  guidance. Bandages were placed bilaterally. The patient tolerated the procedure well. IMPRESSION: Technically successful fluoroscopic guided removal of the bilateral indwelling percutaneous nephrostomy tubes. Ruthann Cancer, MD [facility] Radiology Electronically Signed   By: Ruthann Cancer MD   On: 10/23/2020 17:30   IR Nephro Tube Remov/FL  Result Date: 10/23/2020 INDICATION: 55 year old male with history of prior ureteral obstruction now with functioning indwelling bilateral double-J ureteral stents. EXAM: Bilateral nephrostomy tube removal. COMPARISON:  None. MEDICATIONS: None. ANESTHESIA/SEDATION: None. CONTRAST:  10 mL Omnipaque 300-administered into the collecting system(s) FLUOROSCOPY TIME:  Fluoroscopy Time: 0 minutes 36 seconds (2 mGy). COMPLICATIONS: None immediate. PROCEDURE: Informed written consent was obtained from the patient after a thorough discussion of the procedural risks, benefits and alternatives. All questions were addressed. Maximal Sterile Barrier Technique was utilized including caps, mask, sterile gowns, sterile gloves, sterile drape, hand hygiene and skin antiseptic. A timeout was performed prior to the initiation of the procedure. Gentle hand injection of contrast via the bilateral nephrostomy tubes demonstrated patency with no evidence of hydronephrosis. Each tube was cut to release the pigtail string and removed over a wire without complication under direct fluoroscopic guidance. Bandages were placed bilaterally. The patient tolerated the procedure well. IMPRESSION: Technically successful fluoroscopic guided removal of the bilateral indwelling percutaneous nephrostomy tubes. Ruthann Cancer, MD [facility] Radiology Electronically Signed   By: Ruthann Cancer MD   On: 10/23/2020 17:30    Labs: BMET Recent Labs  Lab 10/17/20 1224 10/18/20 0606 10/19/20 0529 10/20/20 0818  10/22/20 0546  NA 136 137 137 137 136  K 4.4 3.3* 2.9* 4.2 3.4*  CL 107 103 108 110 104  CO2 19* 25 21* 19* 21*  GLUCOSE 105* 99 101* 189* 89  BUN 47* 25* 40* 44* 42*  CREATININE 5.62* 3.47* 4.64* 5.07* 3.72*  CALCIUM 7.1* 7.1* 7.0* 7.2* 7.5*  PHOS 5.0* 3.1  --  3.0  --    CBC Recent Labs  Lab 10/19/20 0529 10/20/20 0818 10/21/20 1012 10/22/20 0546  WBC 5.2 6.8 7.0 6.7  HGB 7.7* 7.6* 8.7* 8.0*  HCT 23.8* 25.1* 27.4* 25.6*  MCV 97.1 101.6* 99.6 99.2  PLT 247 310 309 332    Medications:     calcium carbonate  2 tablet Oral TID WC   Chlorhexidine Gluconate Cloth  6 each Topical Daily   darbepoetin (ARANESP) injection - DIALYSIS  200 mcg Subcutaneous Q7 days   mouth rinse  15 mL Mouth Rinse BID   midodrine  10 mg Oral Daily   multivitamin with minerals  1 tablet Oral BID   pantoprazole  40 mg Oral Q1200      Otelia Santee, MD 10/24/2020, 7:34 AM

## 2020-10-24 NOTE — Discharge Summary (Signed)
Physician Discharge Summary  Chase [name] XBL:[id number] DOB: July 26, 1965 DOA: 10/06/2020  PCP: [name]  Admit date: 10/06/2020 Discharge date: 10/24/2020  Admitted From: Home Disposition:  Home  Discharge Condition:Stable CODE STATUS:DNR Diet recommendation: Renal diet   Brief/Interim Summary:  This is a 55 year old male with past medical history significant for chronic kidney disease stage V history of GI bleed and also requiring gastric bypass prior stroke, Hodgkin's lymphoma, prior MI, cervical radiculopathy, chronic pain syndrome, COVID, PTSD he was admitted after being found minimally responsive. Nephrology was consulted after admission and he was undergoing dialysis.  He has a prolonged hospital stay.  He has been set up for dialysis as an outpatient at [facility]. urology/IR recommended  nephrostomy tube removal because he is having adequate urine output. Plan for discharge today after dialysis.  Following problems were addressed during his hospitalization:  End-stage renal disease on hemodialysis.  Arranged  hemodialysis at the [facility] as per nephrology.  Plan for MWF  Hydronephrosis /chronic urinary retention he had nephrostomy tubes in place . Tubes removed  Hypotension.   Patient is on midodrine  Chronic anemia  likely due to chronic disease end-stage renal disease and malnutrition. Patient received 2 units of packed RBC on October 09, 2020 Hemoglobin is stable in the range of 8 Monitor hemoglobin as an outpatient   Failure to thrive, severe protein calorie malnutrition We  encourage p.o. 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  Previously was at SNF   Disposition Plan is to discharge to home with home health.   Discharge Diagnoses:   Principal Problem:   Acute renal failure superimposed on stage 4 chronic kidney disease (HCC) Active Problems:   BPH (benign prostatic hyperplasia)   Postlaminectomy syndrome, not elsewhere classified   PTSD (post-traumatic stress disorder)   AKI (acute kidney injury) ([place])   Anxiety and depression   Anemia in chronic kidney disease   Acute metabolic encephalopathy   Acute renal failure (ARF) (HCC)   Pressure ulcer    Discharge Instructions  Discharge Instructions     Diet - low sodium heart healthy   Complete by: As directed    Discharge instructions   Complete by: As directed    1)Please take prescribed medication as instructed 2)Follow up with outpatient dialysis center 3)Follow up with vascular surgery as an outpatient.You will be called for appointment   Increase activity slowly   Complete by: As directed    No wound care   Complete by: As directed       Allergies as of 10/24/2020       Reactions   Nsaids Other (See Comments)   Bleeding GI Ulceration   Ciprofloxacin Nausea And Vomiting        Medication List     STOP taking these medications    (feeding supplement) PROSource Plus liquid   feeding supplement Liqd   fentaNYL 25 MCG/HR Commonly known as: DURAGESIC   folic acid 1 MG tablet Commonly known as: FOLVITE   gabapentin 100 MG capsule Commonly known as: NEURONTIN   gabapentin 300 MG capsule Commonly known as: NEURONTIN       TAKE these medications    acetaminophen 500 MG tablet Commonly known as: TYLENOL Take 1 tablet (500 mg total) by mouth every 6 (six) hours as needed for mild pain, fever  or headache.   calcium carbonate 500 MG chewable tablet Commonly known as: TUMS - dosed in mg elemental calcium Chew 1 tablet (200 mg of elemental calcium total) by mouth 3 (three) times daily.   cyanocobalamin 1000 MCG/ML injection Commonly known as: (VITAMIN B-12) Inject 1,000 mcg into the muscle every 30 (thirty) days.   DULoxetine 30  MG capsule Commonly known as: CYMBALTA Take 1 capsule (30 mg total) by mouth 2 (two) times daily.   hydrOXYzine 10 MG tablet Commonly known as: ATARAX/VISTARIL Take 1 tablet (10 mg total) by mouth 3 (three) times daily as needed for itching or anxiety.   midodrine 10 MG tablet Commonly known as: PROAMATINE Take 1 tablet (10 mg total) by mouth daily. Start taking on: October 25, 2020   multivitamin with minerals Tabs tablet Take 1 tablet by mouth daily.   pantoprazole 20 MG tablet Commonly known as: PROTONIX TAKE 1 TABLET (20 MG TOTAL) BY MOUTH 2 (TWO) TIMES DAILY. What changed: how much to take   polyethylene glycol 17 g packet Commonly known as: MIRALAX / GLYCOLAX Take 17 g by mouth daily as needed for moderate constipation.   silodosin 8 MG Caps capsule Commonly known as: Rapaflo Take 1 capsule (8 mg total) by mouth daily with breakfast.   sodium bicarbonate 650 MG tablet TAKE 1 TABLET (650 MG TOTAL) BY MOUTH 2 (TWO) TIMES DAILY. What changed:  how much to take how to take this when to take this       ASK your doctor about these medications    polycarbophil 625 MG tablet Commonly known as: FIBERCON Take 1 tablet (625 mg total) by mouth 2 (two) times daily.   Vitamin D3 25 MCG tablet Commonly known as: Vitamin D TAKE 800 UNITS BY MOUTH DAILY.        Follow-up Information     [facility] Follow up in 6 week(s).   Specialty: Vascular Surgery Why: Office will call you to arrange your appt (sent) Contact information: [address] [facility], [place]. Schedule an appointment as soon as possible for a visit in 1 week(s).   Contact information: [place] [id number] [phone]                Allergies  Allergen Reactions   Nsaids Other (See Comments)    Bleeding GI Ulceration   Ciprofloxacin Nausea And Vomiting     Consultations: Urology, nephrology   Procedures/Studies: CT ABDOMEN PELVIS WO CONTRAST  Result Date: 10/07/2020 CLINICAL DATA:  Renal failure. Altered mental status. Scheduled for TURP. EXAM: CT ABDOMEN AND PELVIS WITHOUT CONTRAST TECHNIQUE: Multidetector CT imaging of the abdomen and pelvis was performed following the standard protocol without IV contrast. COMPARISON:  CT abdomen dated 06/27/2020. FINDINGS: Lower chest: Pneumomediastinum, incompletely imaged. Lung bases are clear. Hepatobiliary: Status post cholecystectomy. No focal liver abnormality is identified. Pancreas: Unremarkable. No pancreatic ductal dilatation or surrounding inflammatory changes. Spleen: Normal in size without focal abnormality. Adrenals/Urinary Tract: LEFT adrenal fullness without discrete mass. RIGHT adrenal gland is unremarkable. Bilateral hydronephrosis, moderate-to-severe in degree, despite nephroureteral stents which appear appropriately positioned. Bladder is partially decompressed by Foley catheter. Stomach/Bowel: Status post gastric surgery. 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 No dilated large or small bowel loops are identified. Vascular/Lymphatic: Aortic atherosclerosis. No abdominal aortic aneurysm. No enlarged lymph nodes are seen. Reproductive: Stable prostate enlargement. Other: No free  fluid or abscess collection is identified. No free intraperitoneal air. Musculoskeletal: No acute or suspicious osseous abnormality. Mild degenerative spondylosis of the thoracolumbar spine. IMPRESSION: 1. Pneumomediastinum, incompletely imaged. Recommend chest CT for complete characterization. 2. Bilateral hydronephrosis, moderate-to-severe in degree, despite nephroureteral stents which appear appropriately positioned, indicating bladder outlet obstruction. 3. 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 No bowel obstruction. 4. Stable prostate enlargement.  Aortic Atherosclerosis (ICD10-I70.0). Critical Value/emergent results were called by telephone at the time of interpretation on 10/07/2020 at 5:21 am to provider Mesner, who verbally acknowledged these results. Electronically Signed   By: Franki Cabot M.D.   On: 10/07/2020 05:22   DG Chest 1 View  Result Date: 10/08/2020 CLINICAL DATA:  Central line placement. EXAM: CHEST  1 VIEW COMPARISON:  10/08/2020 at 5:23 a.m. FINDINGS: Patient is slightly rotated to the right. Left IJ central venous catheter has been advanced as tip is over the SVC horizontally oriented toward the lateral wall. Lungs are hypoinflated with persistent elevation of the right hemidiaphragm. No focal lobar consolidation, effusion or pneumothorax. Cardiomediastinal silhouette and remainder of the exam is unchanged. IMPRESSION: 1. Hypoinflation without acute cardiopulmonary disease. 2. Left IJ central venous catheter has been advanced with tip over the SVC horizontally oriented toward the lateral wall. Electronically Signed   By: Marin Olp M.D.   On: 10/08/2020 13:27   CT CHEST WO CONTRAST  Result Date: 10/07/2020 CLINICAL DATA:  Abnormal x-ray. Pneumomediastinum seen on today's earlier CT abdomen. EXAM: CT CHEST WITHOUT CONTRAST TECHNIQUE: Multidetector CT imaging of the chest was performed following the standard protocol without IV contrast. COMPARISON:  CT abdomen from earlier same day. FINDINGS: Cardiovascular: No thoracic aortic aneurysm. No pericardial effusion. Mediastinum/Nodes: Pneumomediastinum. Trachea and central bronchi are unremarkable. No mass or enlarged lymph nodes are seen within the mediastinum. Small hiatal hernia. Surgical anastomosis at the underlying gastroesophageal junction. Lungs/Pleura: Lungs are clear. No pleural effusion or pneumothorax is seen. Upper Abdomen: Surgical changes about the stomach and at the gastroesophageal junction. Severe bilateral hydronephrosis, incompletely imaged. No free intraperitoneal  air is seen within the upper abdomen. Musculoskeletal: No chest wall mass or suspicious bone lesions identified. IMPRESSION: 1. Pneumomediastinum. The source of the pneumomediastinum is not clearly identified. One possibility would be dehiscence or erosion at the surgical anastomosis seen at the gastroesophageal junction. Differential considerations also include increased intrathoracic pressure related to recent coughing or vomiting, if clinical history correlates. 2. Lungs are clear. 3. Small hiatal hernia. 4. Severe bilateral hydronephrosis, incompletely imaged. This was characterized on today's earlier CT abdomen and pelvis. Electronically Signed   By: Franki Cabot M.D.   On: 10/07/2020 09:39   IR Nephro Tube Remov/FL  Result Date: 10/23/2020 INDICATION: 55 year old male with history of prior ureteral obstruction now with functioning indwelling bilateral double-J ureteral stents. EXAM: Bilateral nephrostomy tube removal. COMPARISON:  None. MEDICATIONS: None. ANESTHESIA/SEDATION: None. CONTRAST:  10 mL Omnipaque 300-administered into the collecting system(s) FLUOROSCOPY TIME:  Fluoroscopy Time: 0 minutes 36 seconds (2 mGy). COMPLICATIONS: None immediate. PROCEDURE: Informed written consent was obtained from the patient after a thorough discussion of the procedural risks, benefits and alternatives. All questions were addressed. Maximal Sterile Barrier Technique was utilized including caps, mask, sterile gowns, sterile gloves, sterile drape, hand hygiene and skin antiseptic. A timeout was performed prior to the initiation of the procedure. Gentle hand injection of contrast via the bilateral nephrostomy tubes demonstrated patency with no evidence of hydronephrosis. Each tube was cut to release the pigtail string  and removed over a wire without complication under direct fluoroscopic guidance. Bandages were placed bilaterally. The patient tolerated the procedure well. IMPRESSION: Technically successful  fluoroscopic guided removal of the bilateral indwelling percutaneous nephrostomy tubes. Ruthann Cancer, MD [facility] Radiology Electronically Signed   By: Ruthann Cancer MD   On: 10/23/2020 17:30   IR Nephro Tube Remov/FL  Result Date: 10/23/2020 INDICATION: 55 year old male with history of prior ureteral obstruction now with functioning indwelling bilateral double-J ureteral stents. EXAM: Bilateral nephrostomy tube removal. COMPARISON:  None. MEDICATIONS: None. ANESTHESIA/SEDATION: None. CONTRAST:  10 mL Omnipaque 300-administered into the collecting system(s) FLUOROSCOPY TIME:  Fluoroscopy Time: 0 minutes 36 seconds (2 mGy). COMPLICATIONS: None immediate. PROCEDURE: Informed written consent was obtained from the patient after a thorough discussion of the procedural risks, benefits and alternatives. All questions were addressed. Maximal Sterile Barrier Technique was utilized including caps, mask, sterile gowns, sterile gloves, sterile drape, hand hygiene and skin antiseptic. A timeout was performed prior to the initiation of the procedure. Gentle hand injection of contrast via the bilateral nephrostomy tubes demonstrated patency with no evidence of hydronephrosis. Each tube was cut to release the pigtail string and removed over a wire without complication under direct fluoroscopic guidance. Bandages were placed bilaterally. The patient tolerated the procedure well. IMPRESSION: Technically successful fluoroscopic guided removal of the bilateral indwelling percutaneous nephrostomy tubes. Ruthann Cancer, MD [facility] Radiology Electronically Signed   By: Ruthann Cancer MD   On: 10/23/2020 17:30   IR US Guidance  Result Date: 10/08/2020 INDICATION: 55 year old gentleman with worsening renal failure was found to have severe bilateral hydronephrosis despite bilateral ureteral stents in place. Interventional radiology  consulted for bilateral percutaneous nephrostomy drain placement. EXAM: Bilateral nephrostomy drain placement utilizing ultrasound and fluoroscopic guidance COMPARISON:  None. MEDICATIONS: 2 g Rocephin IV; The antibiotic was administered in an appropriate time frame prior to skin puncture. ANESTHESIA/SEDATION: Fentanyl 50 mcg IV; Versed 2 mg IV Moderate Sedation Time:  32 minutes The patient was continuously monitored during the procedure by the interventional radiology nurse under my direct supervision. FLUOROSCOPY TIME:  Fluoroscopy Time: 1 minutes 36 seconds (3.5 mGy). COMPLICATIONS: None immediate. PROCEDURE: Informed written consent was obtained from the patient after a thorough discussion of the procedural risks, benefits and alternatives. All questions were addressed. Maximal Sterile Barrier Technique was utilized including caps, mask, sterile gowns, sterile gloves, sterile drape, hand hygiene and skin antiseptic. A timeout was performed prior to the initiation of the procedure. Bilateral flank skin prepped and draped in usual fashion. Sterile ultrasound probe cover and gel utilized throughout the procedure. Utilizing continuous ultrasound guidance, left midpole calyx was accessed with a 21 gauge needle. 21 gauge needle exchanged for transitional dilator set over 0.035 inch guidewire. Transitional dilator set exchanged for 10 French multipurpose pigtail drain over 0.035 inch guidewire. Drain secured to skin with suture and connected to bag. Contrast administered through the drain under fluoroscopy opacified the left renal collecting system and confirmed appropriate placement. Utilizing continuous ultrasound guidance, right lower pole calyx was accessed with a 21 gauge needle. 21 gauge needle exchanged for transitional dilator set over 0.035 inch guidewire. Transitional dilator set exchanged for 10 French multipurpose pigtail drain over 0.035 inch guidewire. Drain secured to skin with suture and connected to  bag. Contrast administered through the drain under fluoroscopy opacified the right renal collecting system and confirmed appropriate placement. Purulent urine was aspirated from both renal collecting systems. Samples were sent for  Gram stain and culture. IMPRESSION: Bilateral 10 French nephrostomy drains placed utilizing fluoroscopic and ultrasound guidance. Electronically Signed   By: Miachel Roux M.D.   On: 10/08/2020 13:08   IR US Guidance  Result Date: 10/08/2020 INDICATION: 55 year old gentleman with worsening renal failure was found to have severe bilateral hydronephrosis despite bilateral ureteral stents in place. Interventional radiology consulted for bilateral percutaneous nephrostomy drain placement. EXAM: Bilateral nephrostomy drain placement utilizing ultrasound and fluoroscopic guidance COMPARISON:  None. MEDICATIONS: 2 g Rocephin IV; The antibiotic was administered in an appropriate time frame prior to skin puncture. ANESTHESIA/SEDATION: Fentanyl 50 mcg IV; Versed 2 mg IV Moderate Sedation Time:  32 minutes The patient was continuously monitored during the procedure by the interventional radiology nurse under my direct supervision. FLUOROSCOPY TIME:  Fluoroscopy Time: 1 minutes 36 seconds (3.5 mGy). COMPLICATIONS: None immediate. PROCEDURE: Informed written consent was obtained from the patient after a thorough discussion of the procedural risks, benefits and alternatives. All questions were addressed. Maximal Sterile Barrier Technique was utilized including caps, mask, sterile gowns, sterile gloves, sterile drape, hand hygiene and skin antiseptic. A timeout was performed prior to the initiation of the procedure. Bilateral flank skin prepped and draped in usual fashion. Sterile ultrasound probe cover and gel utilized throughout the procedure. Utilizing continuous ultrasound guidance, left midpole calyx was accessed with a 21 gauge needle. 21 gauge needle exchanged for transitional dilator set over  0.035 inch guidewire. Transitional dilator set exchanged for 10 French multipurpose pigtail drain over 0.035 inch guidewire. Drain secured to skin with suture and connected to bag. Contrast administered through the drain under fluoroscopy opacified the left renal collecting system and confirmed appropriate placement. Utilizing continuous ultrasound guidance, right lower pole calyx was accessed with a 21 gauge needle. 21 gauge needle exchanged for transitional dilator set over 0.035 inch guidewire. Transitional dilator set exchanged for 10 French multipurpose pigtail drain over 0.035 inch guidewire. Drain secured to skin with suture and connected to bag. Contrast administered through the drain under fluoroscopy opacified the right renal collecting system and confirmed appropriate placement. Purulent urine was aspirated from both renal collecting systems. Samples were sent for Gram stain and culture. IMPRESSION: Bilateral 10 French nephrostomy drains placed utilizing fluoroscopic and ultrasound guidance. Electronically Signed   By: Miachel Roux M.D.   On: 10/08/2020 13:08   DG Chest Port 1 View  Result Date: 10/16/2020 CLINICAL DATA:  Status post dialysis catheter placement EXAM: PORTABLE CHEST 1 VIEW COMPARISON:  10/08/2020 FINDINGS: Previously seen left jugular central line is been removed. New right jugular dialysis catheter is noted in satisfactory position in the distal superior vena cava. No pneumothorax is noted. Elevation the right hemidiaphragm is noted with some compressive atelectasis. Left lung is clear. IMPRESSION: No evidence of pneumothorax following catheter placement. Electronically Signed   By: Inez Catalina M.D.   On: 10/16/2020 16:01   DG Chest Port 1 View  Result Date: 10/08/2020 CLINICAL DATA:  Pneumomediastinum. EXAM: PORTABLE CHEST 1 VIEW COMPARISON:  Chest CT dated 10/07/2020 and chest x-ray dated 10/07/2020. FINDINGS: Heart size and mediastinal contours are stable. Subtle evidence of  mediastinum along the LEFT heart border. Lungs are clear. No pleural effusion or pneumothorax is seen. LEFT IJ central line is stable in position with tip at the level of the upper SVC. IMPRESSION: 1. Subtle evidence of pneumomediastinum along the LEFT heart border. There has certainly been no increase in the degree of pneumomediastinum compared to yesterday's exams. 2. Lungs are clear. No pleural effusion  or pneumothorax seen. Electronically Signed   By: Franki Cabot M.D.   On: 10/08/2020 08:42   DG CHEST PORT 1 VIEW  Result Date: 10/07/2020 CLINICAL DATA:  Status post central line placement EXAM: PORTABLE CHEST 1 VIEW COMPARISON:  10/06/2020 FINDINGS: Cardiac shadow is stable. Left jugular temporary dialysis catheter is noted with the tip in the proximal superior vena cava. No pneumothorax is noted. Lungs are clear. No bony abnormality is noted. IMPRESSION: No pneumothorax following temporary dialysis catheter placement. Electronically Signed   By: Inez Catalina M.D.   On: 10/07/2020 19:51   DG Chest Portable 1 View  Result Date: 10/06/2020 CLINICAL DATA:  Altered mental status. EXAM: PORTABLE CHEST 1 VIEW COMPARISON:  Single-view of the chest 06/19/2020. FINDINGS: The patient is rotated on the study. Lungs clear. Heart size normal. No pneumothorax or pleural fluid. No acute or focal bony abnormality. There is partial visualization of a double-J stent in the right kidney. Surgical clips right upper quadrant noted. IMPRESSION: No acute disease. Electronically Signed   By: Inge Rise M.D.   On: 10/06/2020 19:04   IR NEPHROSTOMY PLACEMENT LEFT  Result Date: 10/08/2020 INDICATION: 55 year old gentleman with worsening renal failure was found to have severe bilateral hydronephrosis despite bilateral ureteral stents in place. Interventional radiology consulted for bilateral percutaneous nephrostomy drain placement. EXAM: Bilateral nephrostomy drain placement utilizing ultrasound and fluoroscopic guidance  COMPARISON:  None. MEDICATIONS: 2 g Rocephin IV; The antibiotic was administered in an appropriate time frame prior to skin puncture. ANESTHESIA/SEDATION: Fentanyl 50 mcg IV; Versed 2 mg IV Moderate Sedation Time:  32 minutes The patient was continuously monitored during the procedure by the interventional radiology nurse under my direct supervision. FLUOROSCOPY TIME:  Fluoroscopy Time: 1 minutes 36 seconds (3.5 mGy). COMPLICATIONS: None immediate. PROCEDURE: Informed written consent was obtained from the patient after a thorough discussion of the procedural risks, benefits and alternatives. All questions were addressed. Maximal Sterile Barrier Technique was utilized including caps, mask, sterile gowns, sterile gloves, sterile drape, hand hygiene and skin antiseptic. A timeout was performed prior to the initiation of the procedure. Bilateral flank skin prepped and draped in usual fashion. Sterile ultrasound probe cover and gel utilized throughout the procedure. Utilizing continuous ultrasound guidance, left midpole calyx was accessed with a 21 gauge needle. 21 gauge needle exchanged for transitional dilator set over 0.035 inch guidewire. Transitional dilator set exchanged for 10 French multipurpose pigtail drain over 0.035 inch guidewire. Drain secured to skin with suture and connected to bag. Contrast administered through the drain under fluoroscopy opacified the left renal collecting system and confirmed appropriate placement. Utilizing continuous ultrasound guidance, right lower pole calyx was accessed with a 21 gauge needle. 21 gauge needle exchanged for transitional dilator set over 0.035 inch guidewire. Transitional dilator set exchanged for 10 French multipurpose pigtail drain over 0.035 inch guidewire. Drain secured to skin with suture and connected to bag. Contrast administered through the drain under fluoroscopy opacified the right renal collecting system and confirmed appropriate placement. Purulent urine  was aspirated from both renal collecting systems. Samples were sent for Gram stain and culture. IMPRESSION: Bilateral 10 French nephrostomy drains placed utilizing fluoroscopic and ultrasound guidance. Electronically Signed   By: Miachel Roux M.D.   On: 10/08/2020 13:08   IR NEPHROSTOMY PLACEMENT RIGHT  Result Date: 10/08/2020 INDICATION: 55 year old gentleman with worsening renal failure was found to have severe bilateral hydronephrosis despite bilateral ureteral stents in place. Interventional radiology consulted for bilateral percutaneous nephrostomy drain placement. EXAM: Bilateral nephrostomy  drain placement utilizing ultrasound and fluoroscopic guidance COMPARISON:  None. MEDICATIONS: 2 g Rocephin IV; The antibiotic was administered in an appropriate time frame prior to skin puncture. ANESTHESIA/SEDATION: Fentanyl 50 mcg IV; Versed 2 mg IV Moderate Sedation Time:  32 minutes The patient was continuously monitored during the procedure by the interventional radiology nurse under my direct supervision. FLUOROSCOPY TIME:  Fluoroscopy Time: 1 minutes 36 seconds (3.5 mGy). COMPLICATIONS: None immediate. PROCEDURE: Informed written consent was obtained from the patient after a thorough discussion of the procedural risks, benefits and alternatives. All questions were addressed. Maximal Sterile Barrier Technique was utilized including caps, mask, sterile gowns, sterile gloves, sterile drape, hand hygiene and skin antiseptic. A timeout was performed prior to the initiation of the procedure. Bilateral flank skin prepped and draped in usual fashion. Sterile ultrasound probe cover and gel utilized throughout the procedure. Utilizing continuous ultrasound guidance, left midpole calyx was accessed with a 21 gauge needle. 21 gauge needle exchanged for transitional dilator set over 0.035 inch guidewire. Transitional dilator set exchanged for 10 French multipurpose pigtail drain over 0.035 inch guidewire. Drain secured to  skin with suture and connected to bag. Contrast administered through the drain under fluoroscopy opacified the left renal collecting system and confirmed appropriate placement. Utilizing continuous ultrasound guidance, right lower pole calyx was accessed with a 21 gauge needle. 21 gauge needle exchanged for transitional dilator set over 0.035 inch guidewire. Transitional dilator set exchanged for 10 French multipurpose pigtail drain over 0.035 inch guidewire. Drain secured to skin with suture and connected to bag. Contrast administered through the drain under fluoroscopy opacified the right renal collecting system and confirmed appropriate placement. Purulent urine was aspirated from both renal collecting systems. Samples were sent for Gram stain and culture. IMPRESSION: Bilateral 10 French nephrostomy drains placed utilizing fluoroscopic and ultrasound guidance. Electronically Signed   By: Miachel Roux M.D.   On: 10/08/2020 13:08   VAS [place] UPPER EXT VEIN MAPPING (PRE-OP AVF)  Result Date: 10/13/2020 [place] MAPPING Patient Name:  Chase [name]  Date of Exam:   10/13/2020 Medical Rec #: [id number]        Accession #:    [id number] Date of Birth: [birth year]        Patient Gender: M Patient Age:   54Y Exam Location:  [facility] Procedure:      VAS [place] UPPER EXT VEIN MAPPING (PRE-OP AVF) Referring Phys: [id number] LINDSAY A KRUSKA --------------------------------------------------------------------------------  Indications: Pre-access. History: ESRD.  Limitations: BMI 18.15 Comparison Study: No prior studies. Performing Technologist: Darlin Coco RDMS,RVT  Examination Guidelines: A complete evaluation includes B-mode imaging, spectral Doppler, color Doppler, and power Doppler as needed of all accessible portions of each vessel. Bilateral testing is considered an integral part of a complete examination. Limited examinations for reoccurring indications may be performed as noted.  +-----------------+-------------+----------+--------+ Right Cephalic   Diameter (cm)Depth (cm)Findings +-----------------+-------------+----------+--------+ Shoulder             0.36        0.23            +-----------------+-------------+----------+--------+ Prox upper arm       0.34        0.20            +-----------------+-------------+----------+--------+ Mid upper arm        0.38        0.26            +-----------------+-------------+----------+--------+ Dist upper arm  0.38        0.17            +-----------------+-------------+----------+--------+ Antecubital fossa    0.48        0.15            +-----------------+-------------+----------+--------+ Prox forearm         0.22        0.21            +-----------------+-------------+----------+--------+ Mid forearm          0.21        0.27            +-----------------+-------------+----------+--------+ Dist forearm         0.19        0.18            +-----------------+-------------+----------+--------+ +-----------------+-------------+----------+--------+ Left Cephalic    Diameter (cm)Depth (cm)Findings +-----------------+-------------+----------+--------+ Shoulder             0.38        0.30            +-----------------+-------------+----------+--------+ Prox upper arm       0.45        0.27            +-----------------+-------------+----------+--------+ Mid upper arm        0.31        0.22            +-----------------+-------------+----------+--------+ Dist upper arm       0.30        0.19            +-----------------+-------------+----------+--------+ Antecubital fossa    0.49        0.13            +-----------------+-------------+----------+--------+ Prox forearm         0.26        0.19            +-----------------+-------------+----------+--------+ Mid forearm          0.17        0.20            +-----------------+-------------+----------+--------+ Dist  forearm         0.18        0.15      IV    +-----------------+-------------+----------+--------+ *See table(s) above for measurements and observations.  Diagnosing physician: Monica Martinez MD Electronically signed by Monica Martinez MD on 10/13/2020 at 6:37:09 PM.    Final    ECHOCARDIOGRAM LIMITED  Result Date: 10/08/2020    ECHOCARDIOGRAM LIMITED REPORT   Patient Name:   Chase [name] Date of Exam: 10/08/2020 Medical Rec #:  [id number]       Height:       66.0 in Accession #:    [id number]      Weight:       119.3 lb Date of Birth:  22-Nov-1965       BSA:          1.605 m Patient Age:    1 years        BP:           126/66 mmHg Patient Gender: M               HR:           61 bpm. Exam Location:  Inpatient Procedure: Limited Echo Indications:    Abnormal ECG  History:        Patient has prior history of Echocardiogram examinations,  most                 recent 06/28/2020. Previous Myocardial Infarction. H/O stroke and                 COVID-19. CKD. GERD. Cancer.  Sonographer:    Clayton Lefort RDCS (AE) Referring Phys: [phone] [facility] HARRIS  Sonographer Comments: Unable to find parasternal or apical imaging windows. Subcostal area covered with ostomy bag and bandages.  Uninterpretable study. Sanda Klein MD Electronically signed by Sanda Klein MD Signature Date/Time: 10/08/2020/3:10:26 PM    Final    HYBRID OR IMAGING ([place])  Result Date: 10/16/2020 There is no interpretation for this exam.  This order is for images obtained during a surgical procedure.  Please See "Surgeries" Tab for more information regarding the procedure.      Subjective:  Patient seen and examined at bedside this morning.  Hemodynamically stable.  Comfortable.  Nephrostomy tubes have been removed.  We discussed about the discharge planning today after dialysis Discharge Exam: Vitals:   10/24/20 0527 10/24/20 0937  BP: 99/62 99/62  Pulse: (!) 55 65  Resp: 18 17  Temp: 97.8 F (36.6 C) 98.2 F (36.8 C)  SpO2:  100% 100%   Vitals:   10/23/20 1835 10/23/20 2104 10/24/20 0527 10/24/20 0937  BP: (!) 94/50 (!) 92/53 99/62 99/62   Pulse: (!) 54 60 (!) 55 65  Resp: 20 16 18 17   Temp: 98.2 F (36.8 C) 98.3 F (36.8 C) 97.8 F (36.6 C) 98.2 F (36.8 C)  TempSrc: Oral Oral Oral Oral  SpO2: 98% 98% 100% 100%  Weight:      Height:        General: Pt is alert, awake, not in acute distress,chronically ill looking,malnourished Cardiovascular: RRR, S1/S2 +, no rubs, no gallops, dialysis catheter on right chest Respiratory: CTA bilaterally, no wheezing, no rhonchi Abdominal: Soft, NT, ND, bowel sounds + Extremities: no edema, no cyanosis, left upper arm AV fistula    The results of significant diagnostics from this hospitalization (including imaging, microbiology, ancillary and laboratory) are listed below for reference.     Microbiology: Recent Results (from the past 240 hour(s))  Surgical pcr screen     Status: None   Collection Time: 10/15/20  9:40 PM   Specimen: Nasal Mucosa; Nasal Swab  Result Value Ref Range Status   MRSA, PCR NEGATIVE NEGATIVE Final   Staphylococcus aureus NEGATIVE NEGATIVE Final    Comment: (NOTE) The Xpert SA Assay (FDA approved for NASAL specimens in patients 66 years of age and older), is one component of a comprehensive surveillance program. It is not intended to diagnose infection nor to guide or monitor treatment. Performed at [facility], [place] [address], [place], [place] [address]   Resp Panel by RT-PCR (Flu A&B, Covid) Nasopharyngeal Swab     Status: None   Collection Time: 10/23/20 12:12 PM   Specimen: Nasopharyngeal Swab; Nasopharyngeal(NP) swabs in vial transport medium  Result Value Ref Range Status   SARS Coronavirus 2 by RT PCR NEGATIVE NEGATIVE Final    Comment: (NOTE) SARS-CoV-2 target nucleic acids are NOT DETECTED.  The SARS-CoV-2 RNA is generally detectable in upper respiratory specimens during the acute phase of infection. The  lowest concentration of SARS-CoV-2 viral copies this assay can detect is 138 copies/mL. A negative result does not preclude SARS-Cov-2 infection and should not be used as the sole basis for treatment or other patient management decisions. A negative result may  occur with  improper specimen collection/handling, submission of specimen other than nasopharyngeal swab, presence of viral mutation(s) within the areas targeted by this assay, and inadequate number of viral copies(<138 copies/mL). A negative result must be combined with clinical observations, patient history, and epidemiological information. The expected result is Negative.  Fact Sheet for Patients:  EntrepreneurPulse.com.au  Fact Sheet for Healthcare Providers:  IncredibleEmployment.be  This test is no t yet approved or cleared by the [place] FDA and  has been authorized for detection and/or diagnosis of SARS-CoV-2 by FDA under an Emergency Use Authorization (EUA). This EUA will remain  in effect (meaning this test can be used) for the duration of the COVID-19 declaration under Section 564(b)(1) of the Act, 21 U.S.C.section 360bbb-3(b)(1), unless the authorization is terminated  or revoked sooner.       Influenza A by PCR NEGATIVE NEGATIVE Final   Influenza B by PCR NEGATIVE NEGATIVE Final    Comment: (NOTE) The Xpert Xpress SARS-CoV-2/FLU/RSV plus assay is intended as an aid in the diagnosis of influenza from Nasopharyngeal swab specimens and should not be used as a sole basis for treatment. Nasal washings and aspirates are unacceptable for Xpert Xpress SARS-CoV-2/FLU/RSV testing.  Fact Sheet for Patients: EntrepreneurPulse.com.au  Fact Sheet for Healthcare Providers: IncredibleEmployment.be  This test is not yet approved or cleared by the [place] FDA and has been authorized for detection and/or diagnosis of SARS-CoV-2 by FDA under  an Emergency Use Authorization (EUA). This EUA will remain in effect (meaning this test can be used) for the duration of the COVID-19 declaration under Section 564(b)(1) of the Act, 21 U.S.C. section 360bbb-3(b)(1), unless the authorization is terminated or revoked.  Performed at [facility], [place] [address], [place], [place] [address]      Labs: BNP (last 3 results) No results for input(s): BNP in the last 8760 hours. Basic Metabolic Panel: Recent Labs  Lab 10/17/20 1224 10/18/20 0606 10/19/20 0529 10/20/20 0818 10/22/20 0546  NA 136 137 137 137 136  K 4.4 3.3* 2.9* 4.2 3.4*  CL 107 103 108 110 104  CO2 19* 25 21* 19* 21*  GLUCOSE 105* 99 101* 189* 89  BUN 47* 25* 40* 44* 42*  CREATININE 5.62* 3.47* 4.64* 5.07* 3.72*  CALCIUM 7.1* 7.1* 7.0* 7.2* 7.5*  MG  --  2.1  --   --   --   PHOS 5.0* 3.1  --  3.0  --    Liver Function Tests: Recent Labs  Lab 10/17/20 1224 10/18/20 0606 10/20/20 0818  ALBUMIN 1.7* 1.8* 1.9*   No results for input(s): LIPASE, AMYLASE in the last 168 hours. No results for input(s): AMMONIA in the last 168 hours. CBC: Recent Labs  Lab 10/18/20 0606 10/19/20 0529 10/20/20 0818 10/21/20 1012 10/22/20 0546  WBC 4.9 5.2 6.8 7.0 6.7  HGB 7.6* 7.7* 7.6* 8.7* 8.0*  HCT 24.2* 23.8* 25.1* 27.4* 25.6*  MCV 96.4 97.1 101.6* 99.6 99.2  PLT 255 247 310 309 332   Cardiac Enzymes: No results for input(s): CKTOTAL, CKMB, CKMBINDEX, TROPONINI in the last 168 hours. BNP: Invalid input(s): POCBNP CBG: No results for input(s): GLUCAP in the last 168 hours. D-Dimer No results for input(s): DDIMER in the last 72 hours. Hgb A1c No results for input(s): HGBA1C in the last 72 hours. Lipid Profile No results for input(s): CHOL, HDL, LDLCALC, TRIG, CHOLHDL, LDLDIRECT in the last 72 hours. Thyroid function studies No results for input(s): TSH, T4TOTAL, T3FREE, THYROIDAB in the last  72 hours.  Invalid input(s): FREET3 Anemia work up No results for  input(s): VITAMINB12, FOLATE, FERRITIN, TIBC, IRON, RETICCTPCT in the last 72 hours. Urinalysis    Component Value Date/Time   COLORURINE YELLOW 10/06/2020 1931   APPEARANCEUR TURBID (A) 10/06/2020 1931   LABSPEC 1.014 10/06/2020 1931   PHURINE 6.0 10/06/2020 1931   GLUCOSEU NEGATIVE 10/06/2020 1931   HGBUR SMALL (A) 10/06/2020 1931   BILIRUBINUR NEGATIVE 10/06/2020 1931   KETONESUR 5 (A) 10/06/2020 1931   PROTEINUR 100 (A) 10/06/2020 1931   NITRITE NEGATIVE 10/06/2020 1931   LEUKOCYTESUR MODERATE (A) 10/06/2020 1931   Sepsis Labs Invalid input(s): PROCALCITONIN,  WBC,  LACTICIDVEN Microbiology Recent Results (from the past 240 hour(s))  Surgical pcr screen     Status: None   Collection Time: 10/15/20  9:40 PM   Specimen: Nasal Mucosa; Nasal Swab  Result Value Ref Range Status   MRSA, PCR NEGATIVE NEGATIVE Final   Staphylococcus aureus NEGATIVE NEGATIVE Final    Comment: (NOTE) The Xpert SA Assay (FDA approved for NASAL specimens in patients 41 years of age and older), is one component of a comprehensive surveillance program. It is not intended to diagnose infection nor to guide or monitor treatment. Performed at [facility], [place] [address] [place], [place] [address]   Resp Panel by RT-PCR (Flu A&B, Covid) Nasopharyngeal Swab     Status: None   Collection Time: 10/23/20 12:12 PM   Specimen: Nasopharyngeal Swab; Nasopharyngeal(NP) swabs in vial transport medium  Result Value Ref Range Status   SARS Coronavirus 2 by RT PCR NEGATIVE NEGATIVE Final    Comment: (NOTE) SARS-CoV-2 target nucleic acids are NOT DETECTED.  The SARS-CoV-2 RNA is generally detectable in upper respiratory specimens during the acute phase of infection. The lowest concentration of SARS-CoV-2 viral copies this assay can detect is 138 copies/mL. A negative result does not preclude SARS-Cov-2 infection and should not be used as the sole basis for treatment or other patient management decisions. A  negative result may occur with  improper specimen collection/handling, submission of specimen other than nasopharyngeal swab, presence of viral mutation(s) within the areas targeted by this assay, and inadequate number of viral copies(<138 copies/mL). A negative result must be combined with clinical observations, patient history, and epidemiological information. The expected result is Negative.  Fact Sheet for Patients:  EntrepreneurPulse.com.au  Fact Sheet for Healthcare Providers:  IncredibleEmployment.be  This test is no t yet approved or cleared by the [place] FDA and  has been authorized for detection and/or diagnosis of SARS-CoV-2 by FDA under an Emergency Use Authorization (EUA). This EUA will remain  in effect (meaning this test can be used) for the duration of the COVID-19 declaration under Section 564(b)(1) of the Act, 21 U.S.C.section 360bbb-3(b)(1), unless the authorization is terminated  or revoked sooner.       Influenza A by PCR NEGATIVE NEGATIVE Final   Influenza B by PCR NEGATIVE NEGATIVE Final    Comment: (NOTE) The Xpert Xpress SARS-CoV-2/FLU/RSV plus assay is intended as an aid in the diagnosis of influenza from Nasopharyngeal swab specimens and should not be used as a sole basis for treatment. Nasal washings and aspirates are unacceptable for Xpert Xpress SARS-CoV-2/FLU/RSV testing.  Fact Sheet for Patients: EntrepreneurPulse.com.au  Fact Sheet for Healthcare Providers: IncredibleEmployment.be  This test is not yet approved or cleared by the [place] FDA and has been authorized for detection and/or diagnosis of SARS-CoV-2 by FDA under an Emergency Use Authorization (EUA). This EUA  will remain in effect (meaning this test can be used) for the duration of the COVID-19 declaration under Section 564(b)(1) of the Act, 21 U.S.C. section 360bbb-3(b)(1), unless the authorization  is terminated or revoked.  Performed at [facility], [place] [address], [place], [place] [address]     Please note: You were cared for by a hospitalist during your hospital stay. Once you are discharged, your primary care physician will handle any further medical issues. Please note that NO REFILLS for any discharge medications will be authorized once you are discharged, as it is imperative that you return to your primary care physician (or establish a relationship with a primary care physician if you do not have one) for your post hospital discharge needs so that they can reassess your need for medications and monitor your lab values.    Time coordinating discharge: 40 minutes  SIGNED:   Shelly Coss, MD  [facility] 10/24/2020, 11:06 AM Pager [phone]  If 7PM-7AM, please contact night-coverage [email or web address] Password [email or web address]

## 2020-10-24 NOTE — TOC Progression Note (Addendum)
Transition of Care (TOC) - Progression Note  Marvetta Gibbons RN, BSN Transitions of Care Unit 4E- RN Case Manager See Treatment Team for direct phone #  Cross Coverage [phone]  Patient Details  Name: ANN GROENEVELD MRN: [id number] Date of Birth: 1965-11-23  Transition of Care [facility]) CM/SW Contact  Dahlia Client, Romeo Rabon, RN Phone Number: 10/24/2020, 3:41 PM  Clinical Narrative:    Notified by CSW that pt does not have a safe discharge plan as there is no family or support person to meet pt at the home when PTAR transports home.  Spoke with Stacie with [facility] that has been servicing pt for [facility] needs- They will follow again for [facility] RN/PT/OT/Aide on discharge- informed d/c is no hold at this point.  Also contacted Ernst Breach at the [facility] (ext. [phone]) to let her know that pt would not be discharging today for his HD needs- per Anderson Malta patient can [facility]F [facility] (they do not accept on the weekends). Anderson Malta also provided the primary provider at the [place]- Dr Doyle Askew- primary CSW is Cyndee Brightly- contact [phone]- ext. [phone]. Anderson Malta also shared that pt is eligible under his VA benefits for both ST/LT skilled placement should he change his mind. He also has benefits for [employer] services although this service would be limited hours. If pt changes his mind would need to contact [facility] regarding SNF placement needs.   Will need to follow up with pt concerning safe discharge plan and how patient plans to function at home alone with no assistance. Pt currently in HD. Will also need to see if pt has key to get into his home at discharge.    Expected Discharge Plan: [place] Barriers to Discharge: Barriers Resolved  Expected Discharge Plan and Services Expected Discharge Plan: [place] In-house Referral: Clinical Social Work Discharge Planning Services: CM Consult Post Acute Care Choice: [place] arrangements for the past  2 months: Single Family Home Expected Discharge Date: 10/24/20               DME Arranged: N/A DME Agency: NA       HH Arranged: RN, PT, OT, Nurse's Aide [place] Agency: [place] Date HH Agency Contacted: 10/12/20 Time [place]: 1200 Representative spoke with at [place]: [place] ([place]) Interventions    Readmission Risk Interventions Readmission Risk Prevention Plan 10/24/2020 07/04/2020 12/25/2019  Transportation Screening Complete Complete Complete  Medication Review Press photographer) Complete Complete Complete  PCP or Specialist appointment within 3-5 days of discharge Complete Complete -  [place] or Home Care Consult Complete Complete -  SW Recovery Care/Counseling Consult Complete Complete -  Palliative Care Screening Complete Not Applicable -  [place] Patient Refused Not Applicable -  Some recent data might be hidden

## 2020-10-24 NOTE — Plan of Care (Signed)
  Problem: Pain Managment: Goal: General experience of comfort will improve Outcome: Progressing   Problem: Clinical Measurements: Goal: Dialysis access will remain free of complications Outcome: Progressing

## 2020-10-24 NOTE — Progress Notes (Signed)
Hemodialysis- Tolerated well without issue. No uf removed as ordered. Patient currently without complaints.  Report called to 68M.

## 2020-10-25 NOTE — Progress Notes (Signed)
Occupational Therapy Treatment Patient Details Name: Chase [name] MRN: [id number] DOB: May 13, 1965 Today's Date: 10/25/2020    History of present illness Pt is 55 yo male who presented to the ED via EMS after being found minimally responsive during a welfare check. PMHx significant for CKD stage IV, hx of GIB and ulcers requiring gastric bypass, Hx of CVA, hodgkin's lymphoma, prior MI, cervical radiculopathy, chronic pain syndrome, COVID, and PTSD.   OT comments  Pt received supine in bed, awake and alert, requiring encouragement to perform functional mobility to further assess strength and activity tolerance for self care tasks. Pt administered pain meds at start of session with c/o generalized pain. Supervision supine to sit, Min A +2 initial sit to stand, requiring rest break due to tight calf muscles and inability to plant heels flat on floor. Pt then required min A +1 for stand pivot to recliner for simulated toilet transfer and additional mobility at min A +1 with chair following behind due to limited tolerance. Pt will benefit to continue acute level OT and post acute rehab to ensure safety and strength is improved to maximize independence with ADLs prior to returning to home setting.    Follow Up Recommendations  SNF;Supervision/Assistance - 24 hour;Other (comment) (noted that pt is currently refusing SNF. If he goes home will need to maximize home health services [facility] RN, PT, OT, aide))    Equipment Recommendations  3 in 1 bedside commode    Recommendations for Other Services PT consult    Precautions / Restrictions Precautions Precautions: Fall       Mobility Bed Mobility Overal bed mobility: Needs Assistance Bed Mobility: Supine to Sit     Supine to sit: Supervision     General bed mobility comments: no physical assistance required this session. Good use of grab bars to assist with trunk elevation.    Transfers Overall transfer level: Needs assistance Equipment  used: Rolling walker (2 wheeled) Transfers: Sit to/from Stand Sit to Stand: Min assist;+2 physical assistance         General transfer comment: min A +2 intially, then to +1    Balance Overall balance assessment: Needs assistance Sitting-balance support: Feet supported;Bilateral upper extremity supported Sitting balance-Leahy Scale: Fair Sitting balance - Comments: BUE support for static sitting. Trunk flexed position.   Standing balance support: Bilateral upper extremity supported Standing balance-Leahy Scale: Fair Standing balance comment: UE reliant of RW.                           ADL either performed or assessed with clinical judgement   ADL Overall ADL's : Needs assistance/impaired Eating/Feeding: Set up;Sitting;Bed level Eating/Feeding Details (indicate cue type and reason): pt received eating upon arrival, once seated in recliner pt given set up to continue self feeding with [place] ice.     Upper Body Bathing: Maximal assistance Upper Body Bathing Details (indicate cue type and reason): Pt concern of back itching, max A to was back with cloth.         Lower Body Dressing: Maximal assistance Lower Body Dressing Details (indicate cue type and reason): to don socks in supine. Toilet Transfer: Minimal assistance;+2 for physical assistance;Stand-pivot;Ambulation Toilet Transfer Details (indicate cue type and reason): simulated toilet transfer from bed to recliner, min A +2 for initial sit to stand due to weakness and some instability observed. Once standing pt reports heels of feet not touching the floor. Educated that likely calf muscles are tight preventing  full ankle ROM, PT addressed in second attempt with ambulation to follow. Min A +1 with chair behind due to limited tolerance.         Functional mobility during ADLs: Minimal assistance       Vision       Perception     Praxis      Cognition Arousal/Alertness: Awake/alert Behavior During  Therapy: Flat affect;WFL for tasks assessed/performed Overall Cognitive Status: Within Functional Limits for tasks assessed                                 General Comments: pt continues to have poor insight of his current deficits        Exercises     Shoulder Instructions       General Comments Pt required heavy encouragement this session to participate in functional mobility and transfers    Pertinent Vitals/ Pain       Pain Assessment: Faces Faces Pain Scale: Hurts little more Pain Location: generalized Pain Descriptors / Indicators: Discomfort;Grimacing Pain Intervention(s): RN gave pain meds during session;Limited activity within patient's tolerance;Monitored during session  Home Living                                          Prior Functioning/Environment              Frequency  Min 2X/week        Progress Toward Goals  OT Goals(current goals can now be found in the care plan section)  Progress towards OT goals: Progressing toward goals  Acute Rehab OT Goals Patient Stated Goal: to go home from the hospital OT Goal Formulation: With patient Time For Goal Achievement: 10/25/20 Potential to Achieve Goals: Fair ADL Goals Pt Will Perform Grooming: with set-up;with supervision;sitting Pt Will Perform Upper Body Dressing: with set-up;with supervision;sitting Pt Will Perform Lower Body Dressing: with min assist;sit to/from stand;with min guard assist;sitting/lateral leans Pt Will Transfer to Toilet: with min guard assist;stand pivot transfer;ambulating;bedside commode Pt/caregiver will Perform Home Exercise Program: Increased strength;Both right and left upper extremity;With Supervision;With minimal assist Additional ADL Goal #1: Pt will complete bed mobility at min A level to prepare for EOB/OOB ADLs.  Plan Discharge plan remains appropriate;Frequency remains appropriate    Co-evaluation    PT/OT/SLP  Co-Evaluation/Treatment: Yes Reason for Co-Treatment: Complexity of the patient's impairments (multi-system involvement);To address functional/ADL transfers   OT goals addressed during session: ADL's and self-care      AM-PAC OT "6 Clicks" Daily Activity     Outcome Measure   Help from another person eating meals?: A Little Help from another person taking care of personal grooming?: A Little Help from another person toileting, which includes using toliet, bedpan, or urinal?: A Little Help from another person bathing (including washing, rinsing, drying)?: A Lot Help from another person to put on and taking off regular upper body clothing?: A Lot Help from another person to put on and taking off regular lower body clothing?: A Lot 6 Click Score: 15    End of Session Equipment Utilized During Treatment: Rolling walker  OT Visit Diagnosis: Unsteadiness on feet (R26.81);Muscle weakness (generalized) (M62.81);Pain;Adult, failure to thrive (R62.7);History of falling (Z91.81)   Activity Tolerance Other (comment);Patient limited by pain (premedicated for pain)   Patient Left in chair;with call bell/phone within reach;with chair alarm  set   Nurse Communication Patient requests pain meds;Need for lift equipment (RN gave pain med)        Time: 3220-2542 OT Time Calculation (min): 26 min  Charges: OT General Charges $OT Visit: 1 Visit OT Treatments $Self Care/Home Management : 8-22 mins  Minus Breeding, MSOT, OTR/L  Supplemental Rehabilitation Services  [phone]    Marius Ditch 10/25/2020, 11:41 AM

## 2020-10-25 NOTE — TOC Progression Note (Signed)
Transition of Care [facility]) - Progression Note    Patient Details  Name: Chase [name] MRN: [id number] Date of Birth: 21-May-1965  Transition of Care [facility]) CM/SW [place], [place] Phone Number: 10/25/2020, 12:45 PM  Clinical Narrative:    CSW spoke with pt concerning disposition on Thursday.  Pt was informed to contact friend with keys to pt's home to be at home by 11:00am to open home for pt's disposition home.  Pt was also updated on transport time 12:00pm to [place] HD on Friday.     Expected Discharge Plan: [place] Barriers to Discharge: Barriers Resolved  Expected Discharge Plan and Services Expected Discharge Plan: [place] In-house Referral: Clinical Social Work Discharge Planning Services: CM Consult Post Acute Care Choice: [place] arrangements for the past 2 months: Single Family Home Expected Discharge Date: 10/24/20               DME Arranged: N/A DME Agency: NA       HH Arranged: RN, PT, OT, Nurse's Aide [place] Agency: [place] Date HH Agency Contacted: 10/12/20 Time [place]: 1200 Representative spoke with at [place]: [place] ([place]) Interventions    Readmission Risk Interventions Readmission Risk Prevention Plan 10/24/2020 07/04/2020 12/25/2019  Transportation Screening Complete Complete Complete  Medication Review Press photographer) Complete Complete Complete  PCP or Specialist appointment within 3-5 days of discharge Complete Complete -  [place] or Home Care Consult Complete Complete -  SW Recovery Care/Counseling Consult Complete Complete -  Palliative Care Screening Complete Not Applicable -  [place] Patient Refused Not Applicable -  Some recent data might be hidden

## 2020-10-25 NOTE — Progress Notes (Signed)
Patient seen and examined at the bedside today.  Goal is to discharge to home with home health.  Discharge orders and summary have already been placed yesterday.  He is medically stable for discharge.  There is no new change in the medical management.  Denies any new complaints today except for cold food.

## 2020-10-25 NOTE — Progress Notes (Signed)
Physical Therapy Treatment Patient Details Name: Chase [name] MRN: [id number] DOB: 09/26/1965 Today's Date: 10/25/2020    History of Present Illness Pt is 55 yo male who presented to the ED via EMS after being found minimally responsive during a welfare check. PMHx significant for CKD stage IV, hx of GIB and ulcers requiring gastric bypass, Hx of CVA, hodgkin's lymphoma, prior MI, cervical radiculopathy, chronic pain syndrome, COVID, and PTSD.    PT Comments    Pt educated on need for progressing his mobility so that he can go home. Pt agreeable despite c/o 9.5/10 generalized pain. Pt is currently supervision for bed mobility, min Ax2 for transfers and min A for ambulation with RW. Pt with increased heel cord tightness with gait and educated on stretching in standing. Pt hopeful to be able to go home today, reports he feels better about moving around. Pt will still need maximal HH help to be able to stay in his home environment.    Follow Up Recommendations  Home health PT;Other (comment);Supervision - Intermittent (maximize HH sevices)     Equipment Recommendations  None recommended by PT (has necessary equipment)       Precautions / Restrictions Precautions Precautions: Fall Restrictions Weight Bearing Restrictions: No    Mobility  Bed Mobility Overal bed mobility: Needs Assistance Bed Mobility: Supine to Sit     Supine to sit: Supervision     General bed mobility comments: no physical assistance required this session. Good use of grab bars to assist with trunk elevation.    Transfers Overall transfer level: Needs assistance Equipment used: Rolling walker (2 wheeled) Transfers: Sit to/from Stand Sit to Stand: Min assist;+2 physical assistance         General transfer comment: min A +2 intially, then to +1  Ambulation/Gait Ambulation/Gait assistance: Min assist Gait Distance (Feet): 16 Feet (1x8, 1x16) Assistive device: Rolling walker (2 wheeled) Gait  Pattern/deviations: Step-through pattern;Decreased step length - right;Decreased step length - left;Decreased dorsiflexion - right;Decreased dorsiflexion - left;Trunk flexed Gait velocity: slowed Gait velocity interpretation: <1.31 ft/sec, indicative of household ambulator General Gait Details: min A for steadying, pt with increased heel cord tightness, resulting in inablitiy to bring heels to floor improved with distance. vc for upright posture       Balance Overall balance assessment: Needs assistance Sitting-balance support: Feet supported;Bilateral upper extremity supported Sitting balance-Leahy Scale: Fair Sitting balance - Comments: BUE support for static sitting. Trunk flexed position.   Standing balance support: Bilateral upper extremity supported Standing balance-Leahy Scale: Fair Standing balance comment: UE reliant of RW.                            Cognition Arousal/Alertness: Awake/alert Behavior During Therapy: Flat affect;WFL for tasks assessed/performed Overall Cognitive Status: Within Functional Limits for tasks assessed                                 General Comments: pt continues to have poor insight of his current deficits      Exercises Other Exercises Other Exercises: standing heel cord stretch 10 count x 6    General Comments General comments (skin integrity, edema, etc.): Pt required heavy encouragement this session to participate in functional mobility and transfers      Pertinent Vitals/Pain Pain Assessment: 0-10 Pain Score: 9  Faces Pain Scale: Hurts little more Pain Location: generalized Pain Descriptors / Indicators:  Discomfort;Grimacing Pain Intervention(s): Limited activity within patient's tolerance;Monitored during session;Repositioned;Patient requesting pain meds-RN notified     PT Goals (current goals can now be found in the care plan section) Acute Rehab PT Goals Patient Stated Goal: to go home from the  hospital PT Goal Formulation: With patient Time For Goal Achievement: 10/26/20 Potential to Achieve Goals: Poor Progress towards PT goals: Progressing toward goals    Frequency    Min 3X/week      PT Plan Current plan remains appropriate    Co-evaluation PT/OT/SLP Co-Evaluation/Treatment: Yes Reason for Co-Treatment: Complexity of the patient's impairments (multi-system involvement) PT goals addressed during session: Mobility/safety with mobility OT goals addressed during session: ADL's and self-care      AM-PAC PT "6 Clicks" Mobility   Outcome Measure  Help needed turning from your back to your side while in a flat bed without using bedrails?: None Help needed moving from lying on your back to sitting on the side of a flat bed without using bedrails?: None Help needed moving to and from a bed to a chair (including a wheelchair)?: A Little Help needed standing up from a chair using your arms (e.g., wheelchair or bedside chair)?: A Little Help needed to walk in hospital room?: A Little Help needed climbing 3-5 steps with a railing? : A Lot 6 Click Score: 19    End of Session Equipment Utilized During Treatment: Gait belt Activity Tolerance: Patient tolerated treatment well Patient left: in chair;with call bell/phone within reach;with chair alarm set Nurse Communication: Mobility status PT Visit Diagnosis: Muscle weakness (generalized) (M62.81);Repeated falls (R29.6);History of falling (Z91.81);Unsteadiness on feet (R26.81);Other abnormalities of gait and mobility (R26.89);Difficulty in walking, not elsewhere classified (R26.2);Hemiplegia and hemiparesis;Pain Hemiplegia - Right/Left: Left Hemiplegia - dominant/non-dominant: Non-dominant Hemiplegia - caused by: Cerebral infarction Pain - Right/Left:  (bilateral)     Time: 2542-7062 PT Time Calculation (min) (ACUTE ONLY): 27 min  Charges:  $Gait Training: 8-22 mins                     Adler Chartrand B. Migdalia Dk PT,  DPT Acute Rehabilitation Services Pager [phone] Office [phone]    [place] 10/25/2020, 2:00 PM

## 2020-10-25 NOTE — Progress Notes (Signed)
Nutrition Follow-up  DOCUMENTATION CODES:   Underweight, Severe malnutrition in context of chronic illness  INTERVENTION:  Continue Magic cup TID with meals Continue double protein portions at meals  Continue MVI with minerals BID Continue calcium carbonate 500 mg TID taken WITH meals  Recommend having vitamin c, zinc, copper, and vitamin D checked after discharge to assess need for further supplementation  NUTRITION DIAGNOSIS:   Severe Malnutrition related to chronic illness (hydronephrosis, gastric bypass) as evidenced by severe fat depletion, severe muscle depletion, energy intake < or equal to 75% for > or equal to 1 month, percent weight loss.  ongoing  GOAL:   Patient will meet greater than or equal to 90% of their needs  progressing  MONITOR:   PO intake, Weight trends, Supplement acceptance, Labs, I & O's  REASON FOR ASSESSMENT:   Malnutrition Screening Tool    ASSESSMENT:   Patient with PMH significant for CKD IV, GIB, ulcers requiring gastric bypass (on 08/24/19), CVA, hodgkin's lymphoma, MI, cervical radiculopathy, recent COVID, recurrent diverticulitis with abscess formation s/p diverting ostomy with [place] resection of redundant sigmoid colon, and bilateral hydronephrosis scheduled for TURP on 7/7 but was unable to show. Presents this admission with ARF and failure to thrive.  7/9- CRRT initiated, had bilateral hydronephrosis despite foley and ureteral stents  7/10- s/p IR placement of bilateral nephrostomy tubes  7/11-off CRRT 7/13- HD for clearance 7/16- declined HD despite expressing desire for long-term HD 7/18- pt underwent the following procedures 2/2 AKI on CKD now ESRD: ultrasound-guided access right internal jugular vein; placement of right internal jugular vein tunneled dialysis catheter (19 cm palindrome); removal of left IJ temporary dialysis catheter; left first stage basilic vein transposition (brachiobasilic arteriovenous fistula placement);  diet liberalized to regular   Pt reports no issues outside of not liking the food due to it being cold. Discussed pt with RN.  Per MD, pt is medically stable for discharge with orders already in place (placed yesterday), pt just awaiting a friend to be able to provide keys to house.   PO Intake: 0-75% x last 8 recorded meals (41% average meal intake)  UOP: 2837ml x24 hours  Medications: Scheduled Meds:  calcium carbonate  2 tablet Oral TID WC   Chlorhexidine Gluconate Cloth  6 each Topical Daily   darbepoetin (ARANESP) injection - DIALYSIS  200 mcg Subcutaneous Q7 days   mouth rinse  15 mL Mouth Rinse BID   midodrine  10 mg Oral Daily   multivitamin with minerals  1 tablet Oral BID   pantoprazole  40 mg Oral Q1200   Labs: Recent Labs  Lab 10/20/20 0818 10/22/20 0546 10/24/20 0643  NA 137 136 138  K 4.2 3.4* 3.8  CL 110 104 113*  CO2 19* 21* 21*  BUN 44* 42* 59*  CREATININE 5.07* 3.72* 4.79*  CALCIUM 7.2* 7.5* 7.4*  PHOS 3.0  --  1.4*  GLUCOSE 189* 89 93   Vitamin/Mineral Profile: Thiamine: 142.9 (WDL) Vitamin A: 22.7 (WDL) Vitamin D: pending Vitamin C: 0.1 (L) Copper: 65 (L) Zinc: 40 (L)   Diet Order:   Diet Order             Diet - low sodium heart healthy           Diet regular Room service appropriate? Yes; Fluid consistency: Thin  Diet effective now                   EDUCATION NEEDS:   Education needs have  been addressed  Skin:  Skin Assessment: Skin Integrity Issues: Skin Integrity Issues:: Stage II, Incisions Stage II: buttocks Incisions: L arm, R neck  Last BM:  7/26 via colostomy  Height:   Ht Readings from Last 1 Encounters:  10/06/20 5\' 6"  (1.676 m)    Weight:   Wt Readings from Last 1 Encounters:  10/24/20 63.6 kg    BMI:  Body mass index is 22.63 kg/m.  Estimated Nutritional Needs:   Kcal:  1500-1700 kcal  Protein:  75-90 grams  Fluid:  >/= 1.5 L/day    Larkin Ina, MS, RD, LDN (she/her/hers) RD pager  number and weekend/on-call pager number located in [place].

## 2020-10-25 NOTE — TOC Progression Note (Addendum)
Transition of Care [facility]) - Progression Note    Patient Details  Name: Chase [name] MRN: [id number] Date of Birth: 1965-05-13  [facility] CM/SW Contact  Loletha Grayer Beverely Pace, RN Phone Number: 10/25/2020, 9:10 AM  Clinical Narrative:  This Case manager contacted patient to follow up on discharge plan. Patient states  that he lives alone, by choice and that he will not agree to go to a [place], and the [place] will not pay for him to go to a better facility, so he will go home, not up for discussion. Patient states that he will be able to get in his home someone has his keys, they are working today and needs 24hr notice, but they will definitely be able to let him in. He has a scooter, and a walker and states he can care for himself and has Home Health services as well. CM will update [facility] Education officer, museum and [employer] Education officer, museum.     1045: Ernst Breach, with [facility] returned call, states that patient will have a HD chair on Friday, July 29,2022 at 12:30pm. Transportation has been arranged for Noon pickup for patient . Case Manager will update patient.   Expected Discharge Plan: [place] Barriers to Discharge: Barriers Resolved  Expected Discharge Plan and Services Expected Discharge Plan: [place] In-house Referral: Clinical Social Work Discharge Planning Services: CM Consult Post Acute Care Choice: [place] arrangements for the past 2 months: Single Family Home Expected Discharge Date: 10/24/20               DME Arranged: N/A DME Agency: NA       HH Arranged: RN, PT, OT, Nurse's Aide [place] Agency: [place] Date HH Agency Contacted: 10/12/20 Time [place]: 1200 Representative spoke with at [place]: [place] ([place]) Interventions    Readmission Risk Interventions Readmission Risk Prevention Plan 10/24/2020 07/04/2020 12/25/2019  Transportation Screening Complete Complete  Complete  Medication Review Press photographer) Complete Complete Complete  PCP or Specialist appointment within 3-5 days of discharge Complete Complete -  [place] or Home Care Consult Complete Complete -  SW Recovery Care/Counseling Consult Complete Complete -  Palliative Care Screening Complete Not Applicable -  [place] Patient Refused Not Applicable -  Some recent data might be hidden

## 2020-10-25 NOTE — Plan of Care (Signed)
  Problem: Activity: Goal: Risk for activity intolerance will decrease Outcome: Progressing   

## 2020-10-25 NOTE — Progress Notes (Signed)
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  Presented with severe AKI requiring CRRT 7/9. Had BL hydronephrosis despite foley and ureteral stents so underwent nephrostomy tube placement.  Despite decompression with good UOP 24h CrCl 3 consistent with ESRD. - Off CRRT 7/11, had HD for clearance 7/13; declined HD 7/16 -  then done on 7/19 and 7/22.  Had been doing TTS but last was on Friday-- Appreicate VVS consult re: perm access and [facility]  has been done- new left upper arm BBF on 7/18 -Outpt CLIP underway -  he wants to do dialysis at the [place] in [place]   - He has a spot MWF  1230pm (2nd shift)   Unfortunately bec of staffing unable to HD yesterday; he is on the schedule for today  He was giving transport an issue bec tray was not moved for him to have breakfast this am. If he refuses to go to HD this am then ok to receive next hd at [facility] tomorrow.   # Chronic urinary retention: missed scheduled TURP per charting - s/p BL nephrostomy tubes this admission - plan per urology for nephrostomy tubes --> given committed to dialysis now plan will be maintain comfort and avoid infection-  looking to internalize/remove prior to discharge ?   # Hx CVA - left hemiparesis - previously at SNF and noted he presented from home most recently   # Anemia - normocytic.  Has rec'd 2u pRBC.   Started ESA 7/11. have increased dose-  s/p transfusion 7/19 with HD   #BMM: PTH 64 , corr ca a bit low - supplement, phos ok no binder.     Volume- makes normal urine-   does not need fluid restriction or UF with HD   Insists upon return home vs SNF - concern expressed about feasibility of plan but he insists with home health and [facility] in [place]-  renal navigator is aware possibly has OP HD spot starting Tuesday 7/26 but with MWF schedule (see above).     Subjective:   C/o pain all over but denies nausea, SOB, fevers. Hungry this am and unhappy that tray not moved over and c/o pain meds not given at 730 this am.   Objective:   BP (!) 102/57 (BP Location: Right Arm)   Pulse 77   Temp 98 F (36.7 C) (Oral)   Resp 18   Ht 5\' 6"  (1.676 m)   Wt 63.6 kg   SpO2 97%   BMI 22.63 kg/m   Intake/Output Summary (Last 24 hours) at 10/25/2020 0758 Last data filed at 10/25/2020 0630 Gross per 24 hour  Intake 120 ml  Output 2875 ml  Net -2755 ml   Weight change:   Physical Exam: General: thin chronically ill appearing gentleman HEENT: [place] AT Eyes: EOMI, anicteric Neck: supple trachea midline Heart: RRR on monitor Lungs: normal WOB on RA Extremities: no edema  GU foley in place Access: - new RIJ TDC and left upper arm AVF (BBF) with thrill and bruit  Imaging: IR Nephro Tube Remov/FL  Result Date: 10/23/2020 INDICATION: 55 year old male with history of prior ureteral obstruction now with functioning indwelling bilateral double-J ureteral stents. EXAM: Bilateral nephrostomy tube removal. COMPARISON:  None. MEDICATIONS: None. ANESTHESIA/SEDATION: None. CONTRAST:  10 mL Omnipaque 300-administered into the collecting system(s) FLUOROSCOPY TIME:  Fluoroscopy Time:  0 minutes 36 seconds (2 mGy). COMPLICATIONS: None immediate. PROCEDURE: Informed written consent was obtained from the patient after a thorough discussion of the procedural risks, benefits and alternatives. All questions were addressed. Maximal Sterile Barrier Technique was utilized including caps, mask, sterile gowns, sterile gloves, sterile drape, hand hygiene and skin antiseptic. A timeout was performed prior to the initiation of the procedure. Gentle hand injection of contrast via the bilateral nephrostomy tubes demonstrated patency with no evidence of hydronephrosis. Each tube was cut to release the pigtail string and removed over a wire without complication under direct fluoroscopic  guidance. Bandages were placed bilaterally. The patient tolerated the procedure well. IMPRESSION: Technically successful fluoroscopic guided removal of the bilateral indwelling percutaneous nephrostomy tubes. Ruthann Cancer, MD [facility] Radiology Electronically Signed   By: Ruthann Cancer MD   On: 10/23/2020 17:30   IR Nephro Tube Remov/FL  Result Date: 10/23/2020 INDICATION: 55 year old male with history of prior ureteral obstruction now with functioning indwelling bilateral double-J ureteral stents. EXAM: Bilateral nephrostomy tube removal. COMPARISON:  None. MEDICATIONS: None. ANESTHESIA/SEDATION: None. CONTRAST:  10 mL Omnipaque 300-administered into the collecting system(s) FLUOROSCOPY TIME:  Fluoroscopy Time: 0 minutes 36 seconds (2 mGy). COMPLICATIONS: None immediate. PROCEDURE: Informed written consent was obtained from the patient after a thorough discussion of the procedural risks, benefits and alternatives. All questions were addressed. Maximal Sterile Barrier Technique was utilized including caps, mask, sterile gowns, sterile gloves, sterile drape, hand hygiene and skin antiseptic. A timeout was performed prior to the initiation of the procedure. Gentle hand injection of contrast via the bilateral nephrostomy tubes demonstrated patency with no evidence of hydronephrosis. Each tube was cut to release the pigtail string and removed over a wire without complication under direct fluoroscopic guidance. Bandages were placed bilaterally. The patient tolerated the procedure well. IMPRESSION: Technically successful fluoroscopic guided removal of the bilateral indwelling percutaneous nephrostomy tubes. Ruthann Cancer, MD [facility] Radiology Electronically Signed   By: Ruthann Cancer MD   On: 10/23/2020 17:30    Labs: BMET Recent Labs  Lab 10/19/20 2353 10/20/20 0818 10/22/20 0546 10/24/20 0643  NA 137 137  136 138  K 2.9* 4.2 3.4* 3.8  CL 108 110 104 113*  CO2 21* 19* 21* 21*  GLUCOSE 101* 189* 89 93  BUN 40* 44* 42* 59*  CREATININE 4.64* 5.07* 3.72* 4.79*  CALCIUM 7.0* 7.2* 7.5* 7.4*  PHOS  --  3.0  --  1.4*   CBC Recent Labs  Lab 10/19/20 0529 10/20/20 0818 10/21/20 1012 10/22/20 0546  WBC 5.2 6.8 7.0 6.7  HGB 7.7* 7.6* 8.7* 8.0*  HCT 23.8* 25.1* 27.4* 25.6*  MCV 97.1 101.6* 99.6 99.2  PLT 247 310 309 332    Medications:     calcium carbonate  2 tablet Oral TID WC   Chlorhexidine Gluconate Cloth  6 each Topical Daily   darbepoetin (ARANESP) injection - DIALYSIS  200 mcg Subcutaneous Q7 days   mouth rinse  15 mL Mouth Rinse BID   midodrine  10 mg Oral Daily   multivitamin with minerals  1 tablet Oral BID   pantoprazole  40 mg Oral Q1200      Otelia Santee, MD 10/25/2020, 7:58 AM

## 2020-10-26 NOTE — TOC Transition Note (Addendum)
Transition of Care [facility]) - CM/SW Discharge Note   Patient Details  Name: Chase [name] MRN: [id number] Date of Birth: November 02, 1965  Transition of Care [facility]) CM/SW Contact:  Ninfa Meeker, RN Phone Number: 10/26/2020, 10:19 AM   Clinical Narrative:  Case Manager spoke with patient to confirm that someone will be available to let him in his home. Patient says that he has someone. CM informed him that [facility] is being arranged for a 11am transport. CM also reviewed his schedule for Friday 10/27/20- He will be picked up from his home at noon by [place] transport, for a 12:30pm  HD treatment. Patient voiced understanding. Case Manager informed bedside RN of [facility] arrangements. Medical Necessity Form completed.   11:04AM CM fax discharge summary and todays MD progress note to [facility] the [place], fax [phone].   Final next level of care: [place] Barriers to Discharge: Barriers Resolved   Patient Goals and CMS Choice Patient states their goals for this hospitalization and ongoing recovery are:: refuses SNF- wants to return home CMS Medicare.gov Compare Post Acute Care list provided to:: Patient Choice offered to / list presented to : Patient  Discharge Placement                       Discharge Plan and Services In-house Referral: Clinical Social Work Discharge Planning Services: CM Consult Post Acute Care Choice: Home Health          DME Arranged: N/A DME Agency: NA       HH Arranged: RN, PT, OT, Nurse's Aide [place] Agency: [place] Date HH Agency Contacted: 10/12/20 Time [place]: 1200 Representative spoke with at [place]: [place] ([place]) Interventions     Readmission Risk Interventions Readmission Risk Prevention Plan 10/24/2020 07/04/2020 12/25/2019  Transportation Screening Complete Complete Complete  Medication Review Press photographer) Complete Complete Complete  PCP or Specialist  appointment within 3-5 days of discharge Complete Complete -  [place] or Home Care Consult Complete Complete -  SW Recovery Care/Counseling Consult Complete Complete -  Palliative Care Screening Complete Not Applicable -  [place] Patient Refused Not Applicable -  Some recent data might be hidden

## 2020-10-26 NOTE — Progress Notes (Signed)
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  Presented with severe AKI requiring CRRT 7/9. Had BL hydronephrosis despite foley and ureteral stents so underwent nephrostomy tube placement.  Despite decompression with good UOP 24h CrCl 3 consistent with ESRD. - Off CRRT 7/11, had HD for clearance 7/13; declined HD 7/16 -  then done on 7/19 and 7/22.  Had been doing TTS but last was on Friday-- Appreicate VVS consult re: perm access and [facility]  has been done- new left upper arm BBF on 7/18 -Outpt CLIP underway -  he wants to do dialysis at the [facility] in [place]   - He has a spot MWF  1230pm (2nd shift) at the [place]; no need for HD today. OK for d/c for renal standpoint.  # Chronic urinary retention: missed scheduled TURP per charting - s/p BL nephrostomy tubes this admission - plan per urology for nephrostomy tubes --> given committed to dialysis now plan will be maintain comfort and avoid infection-     # Hx CVA - left hemiparesis - previously at SNF and noted he presented from home most recently   # Anemia - normocytic.  Has rec'd 2u pRBC.   Started ESA 7/11. have increased dose-  s/p transfusion 7/19 with HD   #BMM: PTH 64 , corr ca a bit low - supplement, phos ok no binder.     Volume- makes normal urine-   does not need fluid restriction or UF with HD   Insists upon return home vs SNF - concern expressed about feasibility of plan but he insists with home health and [facility] transport to dialysis in [place]-  renal navigator is aware possibly has OP HD spot starting  MWF schedule (see above).    Subjective:   pain much better controlled; denies nausea, SOB, fevers.    Objective:   BP (!) 97/53 (BP Location: Right Arm)   Pulse (!) 58   Temp 98.1 F (36.7 C)   Resp 18   Ht 5\' 6"  (1.676 m)   Wt 63.6 kg   SpO2 96%   BMI 22.63 kg/m   Intake/Output  Summary (Last 24 hours) at 10/26/2020 3704 Last data filed at 10/26/2020 0520 Gross per 24 hour  Intake 840 ml  Output 800 ml  Net 40 ml   Weight change:   Physical Exam: General: thin chronically ill appearing gentleman HEENT: [place] AT Eyes: EOMI, anicteric Neck: supple trachea midline Heart: RRR on monitor Lungs: normal WOB on RA Extremities: no edema  GU foley in place Access: - new RIJ TDC and left upper arm AVF (BBF) with thrill and bruit  Imaging: No results found.  Labs: BMET Recent Labs  Lab 10/20/20 0818 10/22/20 0546 10/24/20 0643  NA 137 136 138  K 4.2 3.4* 3.8  CL 110 104 113*  CO2 19* 21* 21*  GLUCOSE 189* 89 93  BUN 44* 42* 59*  CREATININE 5.07* 3.72* 4.79*  CALCIUM 7.2* 7.5* 7.4*  PHOS 3.0  --  1.4*   CBC Recent Labs  Lab 10/20/20 0818 10/21/20 1012 10/22/20 0546  WBC 6.8 7.0 6.7  HGB 7.6* 8.7* 8.0*  HCT 25.1* 27.4* 25.6*  MCV 101.6* 99.6 99.2  PLT 310 309 332    Medications:     calcium carbonate  2 tablet Oral TID WC   Chlorhexidine Gluconate Cloth  6 each Topical Daily   darbepoetin (ARANESP) injection - DIALYSIS  200 mcg Subcutaneous Q7 days   mouth rinse  15 mL Mouth Rinse BID   midodrine  10 mg Oral Daily   multivitamin with minerals  1 tablet Oral BID   pantoprazole  40 mg Oral Q1200      Otelia Santee, MD 10/26/2020, 7:52 AM

## 2020-10-26 NOTE — Progress Notes (Signed)
DISCHARGE NOTE HOME STETSON PELAEZ to be discharged Home per MD order. Discussed prescriptions and follow up appointments with the patient. Prescriptions given to patient; medication list explained in detail. Patient verbalized understanding.  Skin clean, dry and intact without evidence of skin break down, no evidence of skin tears noted. IV catheter discontinued intact. Site without signs and symptoms of complications. Dressing and pressure applied. Pt denies pain at the site currently. No complaints noted.  Patient with ostomy, changed and cleaned before discharge.   An After Visit Summary (AVS) was printed and given to the patient. Patient picked up by [facility] via stretcher , and discharged to home.  Dolores Hoose, RN

## 2020-10-26 NOTE — Progress Notes (Signed)
Patient is hemodynamically stable for discharge.  No new change to medical management.  Discharge summary and orders are already in.

## 2020-10-26 NOTE — Plan of Care (Signed)
  Problem: Activity: Goal: Risk for activity intolerance will decrease Outcome: Progressing   Problem: Safety: Goal: Ability to remain free from injury will improve Outcome: Progressing   Problem: Activity: Goal: Activity intolerance will improve Outcome: Progressing   Problem: Fluid Volume: Goal: Fluid volume balance will be maintained or improved Outcome: Progressing

## 2020-11-17 ENCOUNTER — Other Ambulatory Visit: Payer: Self-pay

## 2020-11-17 DIAGNOSIS — N185 Chronic kidney disease, stage 5: Secondary | ICD-10-CM

## 2020-11-23 ENCOUNTER — Emergency Department (HOSPITAL_COMMUNITY): Payer: No Typology Code available for payment source

## 2020-11-23 ENCOUNTER — Other Ambulatory Visit: Payer: Self-pay

## 2020-11-23 ENCOUNTER — Encounter (HOSPITAL_COMMUNITY): Payer: Self-pay

## 2020-11-23 ENCOUNTER — Inpatient Hospital Stay (HOSPITAL_COMMUNITY)
Admission: EM | Admit: 2020-11-23 | Discharge: 2020-12-01 | DRG: 698 | Disposition: A | Discharge status: Home Health | Payer: No Typology Code available for payment source | Attending: Internal Medicine | Admitting: Internal Medicine

## 2020-11-23 DIAGNOSIS — Z886 Allergy status to analgesic agent status: Secondary | ICD-10-CM | POA: Diagnosis not present

## 2020-11-23 DIAGNOSIS — N138 Other obstructive and reflux uropathy: Secondary | ICD-10-CM

## 2020-11-23 DIAGNOSIS — N401 Enlarged prostate with lower urinary tract symptoms: Secondary | ICD-10-CM | POA: Diagnosis present

## 2020-11-23 DIAGNOSIS — L8992 Pressure ulcer of unspecified site, stage 2: Secondary | ICD-10-CM | POA: Diagnosis present

## 2020-11-23 DIAGNOSIS — Z881 Allergy status to other antibiotic agents status: Secondary | ICD-10-CM

## 2020-11-23 DIAGNOSIS — N390 Urinary tract infection, site not specified: Secondary | ICD-10-CM | POA: Diagnosis not present

## 2020-11-23 DIAGNOSIS — G8929 Other chronic pain: Secondary | ICD-10-CM | POA: Diagnosis not present

## 2020-11-23 DIAGNOSIS — D631 Anemia in chronic kidney disease: Secondary | ICD-10-CM | POA: Diagnosis present

## 2020-11-23 DIAGNOSIS — G9341 Metabolic encephalopathy: Secondary | ICD-10-CM | POA: Diagnosis present

## 2020-11-23 DIAGNOSIS — R339 Retention of urine, unspecified: Secondary | ICD-10-CM | POA: Diagnosis present

## 2020-11-23 DIAGNOSIS — Z79899 Other long term (current) drug therapy: Secondary | ICD-10-CM

## 2020-11-23 DIAGNOSIS — Z7189 Other specified counseling: Secondary | ICD-10-CM | POA: Diagnosis not present

## 2020-11-23 DIAGNOSIS — Z20822 Contact with and (suspected) exposure to covid-19: Secondary | ICD-10-CM | POA: Diagnosis present

## 2020-11-23 DIAGNOSIS — E875 Hyperkalemia: Secondary | ICD-10-CM | POA: Diagnosis present

## 2020-11-23 DIAGNOSIS — Z8571 Personal history of Hodgkin lymphoma: Secondary | ICD-10-CM

## 2020-11-23 DIAGNOSIS — Y846 Urinary catheterization as the cause of abnormal reaction of the patient, or of later complication, without mention of misadventure at the time of the procedure: Secondary | ICD-10-CM | POA: Diagnosis present

## 2020-11-23 DIAGNOSIS — Z66 Do not resuscitate: Secondary | ICD-10-CM | POA: Diagnosis present

## 2020-11-23 DIAGNOSIS — F419 Anxiety disorder, unspecified: Secondary | ICD-10-CM | POA: Diagnosis present

## 2020-11-23 DIAGNOSIS — F4310 Post-traumatic stress disorder, unspecified: Secondary | ICD-10-CM | POA: Diagnosis present

## 2020-11-23 DIAGNOSIS — Z9115 Patient's noncompliance with renal dialysis: Secondary | ICD-10-CM

## 2020-11-23 DIAGNOSIS — N3001 Acute cystitis with hematuria: Secondary | ICD-10-CM | POA: Diagnosis present

## 2020-11-23 DIAGNOSIS — Z515 Encounter for palliative care: Secondary | ICD-10-CM | POA: Diagnosis not present

## 2020-11-23 DIAGNOSIS — N186 End stage renal disease: Secondary | ICD-10-CM

## 2020-11-23 DIAGNOSIS — Z8711 Personal history of peptic ulcer disease: Secondary | ICD-10-CM

## 2020-11-23 DIAGNOSIS — I69954 Hemiplegia and hemiparesis following unspecified cerebrovascular disease affecting left non-dominant side: Secondary | ICD-10-CM | POA: Diagnosis not present

## 2020-11-23 DIAGNOSIS — Z992 Dependence on renal dialysis: Secondary | ICD-10-CM | POA: Diagnosis not present

## 2020-11-23 DIAGNOSIS — Z9884 Bariatric surgery status: Secondary | ICD-10-CM

## 2020-11-23 DIAGNOSIS — Z8673 Personal history of transient ischemic attack (TIA), and cerebral infarction without residual deficits: Secondary | ICD-10-CM

## 2020-11-23 DIAGNOSIS — G894 Chronic pain syndrome: Secondary | ICD-10-CM | POA: Diagnosis present

## 2020-11-23 DIAGNOSIS — Z933 Colostomy status: Secondary | ICD-10-CM

## 2020-11-23 DIAGNOSIS — E440 Moderate protein-calorie malnutrition: Secondary | ICD-10-CM | POA: Diagnosis present

## 2020-11-23 DIAGNOSIS — E876 Hypokalemia: Secondary | ICD-10-CM | POA: Diagnosis present

## 2020-11-23 DIAGNOSIS — R404 Transient alteration of awareness: Secondary | ICD-10-CM | POA: Diagnosis present

## 2020-11-23 DIAGNOSIS — T83518A Infection and inflammatory reaction due to other urinary catheter, initial encounter: Principal | ICD-10-CM | POA: Diagnosis present

## 2020-11-23 DIAGNOSIS — I120 Hypertensive chronic kidney disease with stage 5 chronic kidney disease or end stage renal disease: Secondary | ICD-10-CM | POA: Diagnosis present

## 2020-11-23 DIAGNOSIS — B9620 Unspecified Escherichia coli [E. coli] as the cause of diseases classified elsewhere: Secondary | ICD-10-CM | POA: Diagnosis present

## 2020-11-23 DIAGNOSIS — R443 Hallucinations, unspecified: Secondary | ICD-10-CM | POA: Diagnosis present

## 2020-11-23 DIAGNOSIS — Z681 Body mass index (BMI) 19 or less, adult: Secondary | ICD-10-CM

## 2020-11-23 DIAGNOSIS — M898X9 Other specified disorders of bone, unspecified site: Secondary | ICD-10-CM | POA: Diagnosis present

## 2020-11-23 DIAGNOSIS — Z8249 Family history of ischemic heart disease and other diseases of the circulatory system: Secondary | ICD-10-CM

## 2020-11-23 DIAGNOSIS — R627 Adult failure to thrive: Secondary | ICD-10-CM | POA: Diagnosis present

## 2020-11-23 DIAGNOSIS — N400 Enlarged prostate without lower urinary tract symptoms: Secondary | ICD-10-CM | POA: Diagnosis present

## 2020-11-23 DIAGNOSIS — I252 Old myocardial infarction: Secondary | ICD-10-CM

## 2020-11-23 DIAGNOSIS — K219 Gastro-esophageal reflux disease without esophagitis: Secondary | ICD-10-CM | POA: Diagnosis present

## 2020-11-23 DIAGNOSIS — Z720 Tobacco use: Secondary | ICD-10-CM

## 2020-11-23 HISTORY — DX: End stage renal disease: N18.6

## 2020-11-23 LAB — COMPREHENSIVE METABOLIC PANEL
ALT: 16 U/L (ref 0–44)
AST: 13 U/L — ABNORMAL LOW (ref 15–41)
Albumin: 3.5 g/dL (ref 3.5–5.0)
Alkaline Phosphatase: 166 U/L — ABNORMAL HIGH (ref 38–126)
Anion gap: 11 (ref 5–15)
BUN: 72 mg/dL — ABNORMAL HIGH (ref 6–20)
CO2: 11 mmol/L — ABNORMAL LOW (ref 22–32)
Calcium: 8.7 mg/dL — ABNORMAL LOW (ref 8.9–10.3)
Chloride: 114 mmol/L — ABNORMAL HIGH (ref 98–111)
Creatinine, Ser: 6.79 mg/dL — ABNORMAL HIGH (ref 0.61–1.24)
GFR, Estimated: 9 mL/min — ABNORMAL LOW (ref >60)
Glucose, Bld: 90 mg/dL (ref 70–99)
Potassium: 5.5 mmol/L — ABNORMAL HIGH (ref 3.5–5.1)
Sodium: 136 mmol/L (ref 135–145)
Total Bilirubin: 0.8 mg/dL (ref 0.3–1.2)
Total Protein: 9.5 g/dL — ABNORMAL HIGH (ref 6.5–8.1)

## 2020-11-23 LAB — CBC WITH DIFFERENTIAL/PLATELET
Abs Immature Granulocytes: 0.04 10*3/uL (ref 0.00–0.07)
Basophils Absolute: 0 10*3/uL (ref 0.0–0.1)
Basophils Relative: 0 %
Eosinophils Absolute: 0.1 10*3/uL (ref 0.0–0.5)
Eosinophils Relative: 1 %
HCT: 37.1 % — ABNORMAL LOW (ref 39.0–52.0)
Hemoglobin: 11.1 g/dL — ABNORMAL LOW (ref 13.0–17.0)
Immature Granulocytes: 1 %
Lymphocytes Relative: 5 %
Lymphs Abs: 0.4 10*3/uL — ABNORMAL LOW (ref 0.7–4.0)
MCH: 30.8 pg (ref 26.0–34.0)
MCHC: 29.9 g/dL — ABNORMAL LOW (ref 30.0–36.0)
MCV: 103.1 fL — ABNORMAL HIGH (ref 80.0–100.0)
Monocytes Absolute: 0.6 10*3/uL (ref 0.1–1.0)
Monocytes Relative: 7 %
Neutro Abs: 6.8 10*3/uL (ref 1.7–7.7)
Neutrophils Relative %: 86 %
Platelets: 402 10*3/uL — ABNORMAL HIGH (ref 150–400)
RBC: 3.6 MIL/uL — ABNORMAL LOW (ref 4.22–5.81)
RDW: 16.5 % — ABNORMAL HIGH (ref 11.5–15.5)
WBC: 7.8 10*3/uL (ref 4.0–10.5)
nRBC: 0 % (ref 0.0–0.2)

## 2020-11-23 LAB — URINALYSIS, ROUTINE W REFLEX MICROSCOPIC
Bilirubin Urine: NEGATIVE
Glucose, UA: NEGATIVE mg/dL
Ketones, ur: 5 mg/dL — AB
Nitrite: NEGATIVE
Protein, ur: 100 mg/dL — AB
Specific Gravity, Urine: 1.014 (ref 1.005–1.030)
WBC, UA: >50 WBC/hpf — ABNORMAL HIGH (ref 0–5)
pH: 5 (ref 5.0–8.0)

## 2020-11-23 LAB — RESP PANEL BY RT-PCR (FLU A&B, COVID) ARPGX2
Influenza A by PCR: NEGATIVE
Influenza B by PCR: NEGATIVE
SARS Coronavirus 2 by RT PCR: NEGATIVE

## 2020-11-23 LAB — APTT: aPTT: 43 seconds — ABNORMAL HIGH (ref 24–36)

## 2020-11-23 LAB — LACTIC ACID, PLASMA
Lactic Acid, Venous: 0.6 mmol/L (ref 0.5–1.9)
Lactic Acid, Venous: 0.7 mmol/L (ref 0.5–1.9)

## 2020-11-23 LAB — PROTIME-INR
INR: 1.2 (ref 0.8–1.2)
Prothrombin Time: 15.1 seconds (ref 11.4–15.2)

## 2020-11-23 MED ORDER — SODIUM BICARBONATE 8.4 % IV SOLN
50.0000 meq | Freq: Once | INTRAVENOUS | Status: AC
  Administered 2020-11-23: 50 meq via INTRAVENOUS

## 2020-11-23 MED ORDER — SODIUM BICARBONATE 650 MG PO TABS
650.0000 mg | ORAL_TABLET | Freq: Two times a day (BID) | ORAL | Status: DC
  Administered 2020-11-24 – 2020-11-26 (×6): 650 mg via ORAL
  Filled 2020-11-23 (×7): qty 1

## 2020-11-23 MED ORDER — SODIUM CHLORIDE 0.9 % IV SOLN: 2.0000 g | INTRAVENOUS | Status: DC

## 2020-11-23 MED ORDER — POLYETHYLENE GLYCOL 3350 17 G PO PACK: 17.0000 g | PACK | Freq: Every day | ORAL | Status: DC | PRN

## 2020-11-23 MED ORDER — ACETAMINOPHEN 325 MG PO TABS: 650.0000 mg | ORAL_TABLET | Freq: Four times a day (QID) | ORAL | Status: DC | PRN

## 2020-11-23 MED ORDER — SODIUM CHLORIDE 0.9 % IV SOLN
1.0000 g | Freq: Once | INTRAVENOUS | Status: AC
  Administered 2020-11-23: 1 g via INTRAVENOUS
  Filled 2020-11-23: qty 10

## 2020-11-23 MED ORDER — SODIUM BICARBONATE 8.4 % IV SOLN
INTRAVENOUS | Status: AC
  Filled 2020-11-23: qty 50

## 2020-11-23 MED ORDER — HEPARIN SODIUM (PORCINE) 5000 UNIT/ML IJ SOLN
5000.0000 [IU] | Freq: Three times a day (TID) | INTRAMUSCULAR | Status: DC
  Administered 2020-11-23 – 2020-12-01 (×23): 5000 [IU] via SUBCUTANEOUS
  Filled 2020-11-23 (×23): qty 1

## 2020-11-23 MED ORDER — PANTOPRAZOLE SODIUM 20 MG PO TBEC: 20.0000 mg | DELAYED_RELEASE_TABLET | Freq: Two times a day (BID) | ORAL | Status: DC

## 2020-11-23 MED ORDER — SODIUM ZIRCONIUM CYCLOSILICATE 5 G PO PACK
10.0000 g | PACK | Freq: Once | ORAL | Status: AC
  Administered 2020-11-23: 10 g via ORAL
  Filled 2020-11-23: qty 2

## 2020-11-23 MED ORDER — HYDROCODONE-ACETAMINOPHEN 5-325 MG PO TABS
1.0000 | ORAL_TABLET | Freq: Two times a day (BID) | ORAL | Status: DC | PRN
  Administered 2020-11-24: 1 via ORAL
  Filled 2020-11-23 (×2): qty 1

## 2020-11-23 MED ORDER — SODIUM CHLORIDE 0.9 % IV SOLN
2.0000 g | Freq: Once | INTRAVENOUS | Status: AC
  Administered 2020-11-23: 2 g via INTRAVENOUS
  Filled 2020-11-23: qty 2

## 2020-11-23 MED ORDER — ACETAMINOPHEN 650 MG RE SUPP: 650.0000 mg | Freq: Four times a day (QID) | RECTAL | Status: DC | PRN

## 2020-11-23 MED ORDER — PANTOPRAZOLE SODIUM 40 MG PO TBEC
40.0000 mg | DELAYED_RELEASE_TABLET | Freq: Every day | ORAL | Status: DC
  Administered 2020-11-24 – 2020-12-01 (×8): 40 mg via ORAL
  Filled 2020-11-23 (×8): qty 1

## 2020-11-23 MED ORDER — MIDODRINE HCL 5 MG PO TABS
10.0000 mg | ORAL_TABLET | Freq: Every day | ORAL | Status: DC
  Administered 2020-11-24 – 2020-11-26 (×3): 10 mg via ORAL
  Filled 2020-11-23 (×3): qty 2

## 2020-11-23 NOTE — ED Notes (Signed)
Total ostomy bag replaced.

## 2020-11-23 NOTE — Plan of Care (Signed)
Contacted by the ER.  Nephrology to see in AM.  ESRD on HD at the [place] in [place] and has been noncompliant with outpatient HD.   He is being admitted with AMS  Spoke with ER - appreciate their assistance  Sodium bicarb 1 amp once now  Lokelma 10 gram once now (he is able to take meds and actually alert and oriented x 3 per ER)  Plan for HD in AM.   Would also reassess goals of care given his noncompliance   Claudia Desanctis, MD 6:25 PM 11/23/2020

## 2020-11-23 NOTE — Progress Notes (Signed)
Client does not respond appropriately when asked to rate pain using pain scale. Client is fidgeting with his eyes closed but easily arousable.

## 2020-11-23 NOTE — ED Triage Notes (Signed)
Dialysis pt has not had dialysis for 13 days, pt has an ostomy and has pulled it off.  Pt is hallucinating, alert and oriented,  pt covered in feces, pt family encourage pt to come to the ER.   Chronic Foley in place

## 2020-11-23 NOTE — Progress Notes (Signed)
Pharmacy Antibiotic Note  Chase [name] is a 55 y.o. male admitted on 11/23/2020 with UTI.  Pharmacy has been consulted for cefepime dosing.  Plan: Cefepime 2000 mg IV every MWF w/ HD Monitor labs, c/s, and patient improvement.  Height: 5\' 6"  (167.6 cm) Weight: 64 kg (141 lb 1.5 oz) IBW/kg (Calculated) : 63.8  Temp (24hrs), Avg:97.9 F (36.6 C), Min:97.1 F (36.2 C), Max:98.4 F (36.9 C)  Recent Labs  Lab 11/23/20 1554 11/23/20 1753  WBC 7.8  --   CREATININE 6.79*  --   LATICACIDVEN 0.6 0.7    Estimated Creatinine Clearance: 10.8 mL/min (A) (by C-G formula based on SCr of 6.79 mg/dL (H)).    Allergies  Allergen Reactions   Nsaids Other (See Comments)    Bleeding GI Ulceration   Ciprofloxacin Nausea And Vomiting    Antimicrobials this admission: Cefepime 8/25 >>  Microbiology results: 8/25 BCx: pending 8/25 UCx: pending  11/10/2019 Ucx: pseudomonas and e. coli   Thank you for allowing pharmacy to be a part of this patient's care.  Ramond Craver 11/23/2020 8:27 PM

## 2020-11-23 NOTE — ED Provider Notes (Signed)
[facility] EMERGENCY DEPARTMENT Provider Note   CSN: [id number] Arrival date & time: 11/23/20  1411     History Chief Complaint  Patient presents with   Failure To Thrive    Chase [name] is a 55 y.o. male.  HPI  Patient is a 55 year old male with a history of CKD on HD, CVA, gastric bypass, Hodgkin's lymphoma, who presents to the emergency department due to failure to thrive.  Patient has a complex medical history and lives alone.  His son is at bedside and states that he was notified by his Education officer, museum at the [place] who stated that he has not gone to hemodialysis since August 12.  He states that he appears to be intermittently hallucinating as well and "is worried people are in his home and breaking into his home".  Patient currently A&O x3 and states that he has some mild rhinorrhea but otherwise has no physical complaints.  Denies any chest pain, shortness of breath, or abdominal pain.  He states that his ostomy bag recently broke and upon arrival to the emergency department his whole torso was covered in feces.  Has a chronic indwelling Foley catheter with about 200 cc of brown urine with sediment in the bag.  His son confirms that he is a DNR but patient does note that he is amenable to admission as well as restarting hemodialysis.     Past Medical History:  Diagnosis Date   Anemia    Cervical radiculopathy    with left sided weakness   Chemical exposure    service related injury   Chronic pain    CKD (chronic kidney disease), stage IV ([place]) 10/17/2019   COVID-19    Dyspnea    GERD (gastroesophageal reflux disease)    GIB (gastrointestinal bleeding)    Headache    Heart attack ([place])    History of CVA (cerebrovascular accident) 12/31/2019   History of gastric bypass 08/24/2019   History of stomach ulcers 05/19/2013   Formatting of this note might be different from the original. Attributed to chronic ibuprofen use   Hodgkin lymphoma, unspecified, unspecified site ([place])  12/10/2012   Formatting of this note might be different from the original. In remission.   Hypokalemia    Hypomagnesemia    PTSD (post-traumatic stress disorder)    Renal disorder    CKD   Stroke [facility])    Urinary retention    due to bladder outlet obstruction    Patient Active Problem List   Diagnosis Date Noted   Pressure ulcer 10/19/2020   Acute renal failure (ARF) ([place]) 10/07/2020   Acute renal failure superimposed on stage 4 chronic kidney disease ([place]) 10/06/2020   Sepsis with acute renal failure (HCC)    Anemia of chronic disease    Acute urinary retention    Bilateral hydronephrosis    Acute metabolic encephalopathy    Acute kidney injury superimposed on chronic kidney disease ([place])    Delirium    Kidney failure 06/27/2020   Pressure injury of skin 06/27/2020   Physical deconditioning    Cachexia ([place]) 04/20/2020   Personality disorder  04/19/2020   Diverticulitis of sigmoid colon with abscess s/p colectomy/colostomy [facility]) 04/18/2020 04/18/2020   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[facility]) 04/18/2020   Acute respiratory failure with hypoxia ([place])    DNR (do not resuscitate) discussion    Palliative care by specialist    Adult failure to thrive    Sepsis with acute renal failure without septic shock ([place]) 03/31/2020  Incisional hernia - epigastric 01/04/2020   Chronic narcotic use 01/02/2020   Anemia in chronic kidney disease 01/02/2020   ATN (acute tubular necrosis) ([place]) 12/31/2019   Candida UTI 12/31/2019   Electrolyte abnormality 12/31/2019   Increased anion gap metabolic acidosis 62/37/6283   Microcytic anemia 12/31/2019   ABLA (acute blood loss anemia) 12/31/2019   Anxiety and depression 12/31/2019   History of CVA (cerebrovascular accident) 12/31/2019   BMI less than 19,adult 12/31/2019   Bilateral ureteral obstruction s/p perc nephrosotmy & stenting 12/30/2019   AKI (acute kidney injury) ([place]) 12/24/2019   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 coli  UTI (urinary tract infection) 11/15/2019   PTSD (post-traumatic stress disorder)    Chemical exposure    Cervical radiculopathy    Shock ([place]) 11/08/2019   Protein-calorie malnutrition, severe 10/23/2019   Cerebral embolism with cerebral infarction 10/18/2019   Acute left-sided weakness 10/17/2019   CKD (chronic kidney disease) stage 5, GFR less than 15 ml/min (HCC) 10/17/2019   Chronic pain 10/17/2019   Fall 10/17/2019   Pseudomonas urinary tract infection 08/24/2019   BPH (benign prostatic hyperplasia) 08/24/2019   Acute kidney failure ([place]) 08/24/2019   Anemia 08/24/2019   Dyspnea 08/24/2019   History of COVID-19 08/24/2019   History of elevated PSA 08/24/2019   History of gastric bypass 08/24/2019   Hyponatremia 15/17/6160   Metabolic acidosis 73/71/0626   Skin lesion of neck 94/85/4627   Toxic metabolic encephalopathy 03/50/0938   Cervical spondylosis without myelopathy 06/19/2016   Neck pain 09/04/2015   History of stomach ulcers 05/19/2013   Male erectile dysfunction 05/19/2013   Hodgkin lymphoma, unspecified, unspecified site ([place]) 12/10/2012   Iron deficiency anemia 12/10/2012   Pernicious anemia 12/10/2012   Arthralgia of multiple sites 04/20/2012   Postlaminectomy syndrome, not elsewhere classified 04/20/2012    Past Surgical History:  Procedure Laterality Date   AV FISTULA PLACEMENT Left 10/16/2020   Procedure: LEFT ARM ARTERIOVENOUS BRACHIOBASILIC FISTULA CREATION;  Surgeon: [name], MD;  Location: [facility];  Service: Vascular;  Laterality: Left;   CHOLECYSTECTOMY     COLONOSCOPY     COLONOSCOPY WITH PROPOFOL N/A 04/18/2020   Procedure: COLONOSCOPY WITH PROPOFOL;  Surgeon: Doran Stabler, MD;  Location: [facility];  Service: Gastroenterology;  Laterality: N/A;   CYSTOSCOPY W/ URETERAL STENT PLACEMENT N/A 11/14/2019   Procedure: CYSTOSCOPY WITH RETROGRADE PYELOGRAM bilateral Wyvonnia Dusky STENT PLACEMENT left fulguration bladder . cystogram;  Surgeon:  Raynelle Bring, MD;  Location: [facility];  Service: Urology;  Laterality: N/A;   CYSTOSCOPY W/ URETERAL STENT PLACEMENT Bilateral 12/25/2019   Procedure: CYSTOSCOPY WITH bilateral  RETROGRADE PYELOGRAM/ leftURETERAL STENT PLACEMENT;  Surgeon: Lucas Mallow, MD;  Location: [facility];  Service: Urology;  Laterality: Bilateral;   CYSTOSCOPY W/ URETERAL STENT PLACEMENT Bilateral 07/06/2020   Procedure: CYSTOSCOPY WITH BILATERAL STENT EXCHANGE; BILATERAL PYELOGRAM RETROGRADE;  Surgeon: Raynelle Bring, MD;  Location: [facility];  Service: Urology;  Laterality: Bilateral;   CYSTOSCOPY WITH URETEROSCOPY AND STENT PLACEMENT N/A 04/18/2020   Procedure: CYSTOSCOPY WITH BILATERAL URETERAL STENT CHANGE PLACEMENT;  Surgeon: Ardis Hughs, MD;  Location: [facility];  Service: Urology;  Laterality: N/A;   EPIGASTRIC HERNIA REPAIR N/A 04/18/2020   Procedure: HERNIA REPAIR EPIGASTRIC ADULT;  Surgeon: Michael Boston, MD;  Location: [place];  Service: General;  Laterality: N/A;   GASTRIC BYPASS     HERNIA REPAIR     INSERTION OF DIALYSIS CATHETER Right 10/16/2020   Procedure: INSERTION OF RIGHT  INTERNAL JUGULAR TUNNELED DIALYSIS CATHETER WITH ULTRASOUND GUIDANCE;  Surgeon: [name], MD;  Location: [facility];  Service: Vascular;  Laterality: Right;   IR FLUORO GUIDE CV LINE RIGHT  04/01/2020   IR NEPHRO TUBE REMOV/FL  04/25/2020   IR NEPHRO TUBE REMOV/FL  10/23/2020   IR NEPHRO TUBE REMOV/FL  10/23/2020   IR NEPHROSTOMY PLACEMENT LEFT  12/27/2019   IR NEPHROSTOMY PLACEMENT LEFT  04/01/2020   IR NEPHROSTOMY PLACEMENT LEFT  10/08/2020   IR NEPHROSTOMY PLACEMENT RIGHT  11/16/2019   IR NEPHROSTOMY PLACEMENT RIGHT  12/27/2019   IR NEPHROSTOMY PLACEMENT RIGHT  04/01/2020   IR NEPHROSTOMY PLACEMENT RIGHT  10/08/2020   IR SINUS/FIST TUBE CHK-NON GI  12/03/2019   IR URETERAL STENT PLACEMENT EXISTING ACCESS RIGHT  12/08/2019   IR URETERAL STENT PLACEMENT EXISTING ACCESS RIGHT  01/26/2020   IR US GUIDANCE  10/08/2020   IR US  GUIDANCE  10/08/2020   IR US GUIDE VASC ACCESS RIGHT  04/01/2020   LAPAROSCOPIC LOOP COLOSTOMY N/A 04/18/2020   Procedure: LAPAROSCOPIC SIGMOID COLOTOMY WITH COLOSTOMY;  Surgeon: Michael Boston, MD;  Location: [place];  Service: General;  Laterality: N/A;       Family History  Problem Relation Age of Onset   Renal cancer Father        mets to liver and lungs and pancreas   Heart disease Father    Heart disease Paternal Grandfather    Colon cancer Maternal Grandmother    Colon cancer Paternal Uncle    Stomach cancer Neg Hx    Esophageal cancer Neg Hx    Pancreatic cancer Neg Hx     Social History   Tobacco Use   Smoking status: Never   Smokeless tobacco: Current  Vaping Use   Vaping Use: Never used  Substance Use Topics   Alcohol use: Never   Drug use: Never    Home Medications Prior to Admission medications   Medication Sig Start Date End Date Taking? Authorizing Provider  acetaminophen (TYLENOL) 500 MG tablet Take 1 tablet (500 mg total) by mouth every 6 (six) hours as needed for mild pain, fever or headache. 05/01/20   Samella Parr, NP  calcium carbonate (TUMS - DOSED IN MG ELEMENTAL CALCIUM) 500 MG chewable tablet Chew 1 tablet (200 mg of elemental calcium total) by mouth 3 (three) times daily. 05/01/20   Samella Parr, NP  cyanocobalamin (,VITAMIN B-12,) 1000 MCG/ML injection Inject 1,000 mcg into the muscle every 30 (thirty) days.  12/28/13   [provider]  DULoxetine (CYMBALTA) 30 MG capsule Take 1 capsule (30 mg total) by mouth 2 (two) times daily. 07/07/20 07/07/21  Raiford Noble Latif, DO  hydrOXYzine (ATARAX/VISTARIL) 10 MG tablet Take 1 tablet (10 mg total) by mouth 3 (three) times daily as needed for itching or anxiety. 07/07/20   Raiford Noble Latif, DO  midodrine (PROAMATINE) 10 MG tablet Take 1 tablet (10 mg total) by mouth daily. 10/25/20   Shelly Coss, MD  Multiple Vitamin (MULTIVITAMIN WITH MINERALS) TABS tablet Take 1 tablet by mouth daily. 05/03/20    Samella Parr, NP  pantoprazole (PROTONIX) 20 MG tablet TAKE 1 TABLET (20 MG TOTAL) BY MOUTH 2 (TWO) TIMES DAILY. 05/03/20 05/03/21  Samella Parr, NP  polycarbophil (FIBERCON) 625 MG tablet Take 1 tablet (625 mg total) by mouth 2 (two) times daily. Patient not taking: Reported on 10/07/2020 05/01/20   Samella Parr, NP  polyethylene glycol (MIRALAX / GLYCOLAX) 17 g packet  Take 17 g by mouth daily as needed for moderate constipation. 07/07/20   Raiford Noble Latif, DO  silodosin (RAPAFLO) 8 MG CAPS capsule Take 1 capsule (8 mg total) by mouth daily with breakfast. 07/07/20   Raiford Noble Latif, DO  sodium bicarbonate 650 MG tablet TAKE 1 TABLET (650 MG TOTAL) BY MOUTH 2 (TWO) TIMES DAILY. 05/03/20 05/03/21  Samella Parr, NP  Vitamin D3 (VITAMIN D) 25 MCG tablet TAKE 800 UNITS BY MOUTH DAILY. Patient not taking: Reported on 10/07/2020 05/03/20 05/03/21  Samella Parr, NP    Allergies    Nsaids and Ciprofloxacin  Review of Systems   Review of Systems  All other systems reviewed and are negative. Ten systems reviewed and are negative for acute change, except as noted in the HPI.   Physical Exam Updated Vital Signs BP 120/74   Pulse 72   Temp 98.4 F (36.9 C)   Resp 20   Ht 5' 6"  (1.676 m)   Wt 64 kg   SpO2 100%   BMI 22.77 kg/m   Physical Exam Vitals and nursing note reviewed.  Constitutional:      General: He is not in acute distress.    Appearance: He is not ill-appearing, toxic-appearing or diaphoretic.     Comments: Fatigue appearing adult male.  Extremely cachectic.  Answers questions and follows commands when asked.  HENT:     Head: Normocephalic and atraumatic.     Right Ear: External ear normal.     Left Ear: External ear normal.     Nose: Nose normal.     Mouth/Throat:     Mouth: Mucous membranes are moist.     Pharynx: Oropharynx is clear. No oropharyngeal exudate or posterior oropharyngeal erythema.  Eyes:     General: No scleral icterus.       Right eye: No  discharge.        Left eye: No discharge.     Extraocular Movements: Extraocular movements intact.     Conjunctiva/sclera: Conjunctivae normal.  Cardiovascular:     Rate and Rhythm: Normal rate and regular rhythm.     Pulses: Normal pulses.     Heart sounds: Normal heart sounds. No murmur heard.   No friction rub. No gallop.  Pulmonary:     Effort: Pulmonary effort is normal. No respiratory distress.     Breath sounds: Normal breath sounds. No stridor. No wheezing, rhonchi or rales.  Abdominal:     General: Abdomen is flat.     Palpations: Abdomen is soft.     Tenderness: There is no abdominal tenderness.     Comments: Abdomen is flat and soft.  Ostomy noted to the left abdomen.  Mild erythema surrounding the stoma.  Genitourinary:    Comments: Foley catheter in place.  200 cc of dark brown urine noted in the catheter bag. Musculoskeletal:        General: Normal range of motion.     Cervical back: Normal range of motion and neck supple. No tenderness.  Skin:    General: Skin is warm and dry.  Neurological:     General: No focal deficit present.     Mental Status: He is alert and oriented to person, place, and time.     Comments: A&O x3.  Moving all 4 extremities.  Psychiatric:        Mood and Affect: Mood normal.        Behavior: Behavior normal.   ED Results / Procedures /  Treatments   Labs (all labs ordered are listed, but only abnormal results are displayed) Labs Reviewed  COMPREHENSIVE METABOLIC PANEL - Abnormal; Notable for the following components:      Result Value   Potassium 5.5 (*)    Chloride 114 (*)    CO2 11 (*)    BUN 72 (*)    Creatinine, Ser 6.79 (*)    Calcium 8.7 (*)    Total Protein 9.5 (*)    AST 13 (*)    Alkaline Phosphatase 166 (*)    GFR, Estimated 9 (*)    All other components within normal limits  CBC WITH DIFFERENTIAL/PLATELET - Abnormal; Notable for the following components:   RBC 3.60 (*)    Hemoglobin 11.1 (*)    HCT 37.1 (*)    MCV  103.1 (*)    MCHC 29.9 (*)    RDW 16.5 (*)    Platelets 402 (*)    Lymphs Abs 0.4 (*)    All other components within normal limits  APTT - Abnormal; Notable for the following components:   aPTT 43 (*)    All other components within normal limits  URINALYSIS, ROUTINE W REFLEX MICROSCOPIC - Abnormal; Notable for the following components:   APPearance CLOUDY (*)    Hgb urine dipstick LARGE (*)    Ketones, ur 5 (*)    Protein, ur 100 (*)    Leukocytes,Ua MODERATE (*)    WBC, UA >50 (*)    Bacteria, UA FEW (*)    Non Squamous Epithelial 0-5 (*)    All other components within normal limits  CULTURE, BLOOD (SINGLE)  RESP PANEL BY RT-PCR (FLU A&B, COVID) ARPGX2  LACTIC ACID, PLASMA  PROTIME-INR  LACTIC ACID, PLASMA   EKG None  Radiology CT HEAD WO CONTRAST (5MM)  Result Date: 11/23/2020 CLINICAL DATA:  Delirium, hallucinations EXAM: CT HEAD WITHOUT CONTRAST TECHNIQUE: Contiguous axial images were obtained from the base of the skull through the vertex without intravenous contrast. COMPARISON:  06/27/2020 FINDINGS: Brain: No acute infarct or hemorrhage. Stable small chronic right occipital cortical infarct. Lateral ventricles and midline structures are unremarkable. There are no acute extra-axial fluid collections. There is no mass effect. Vascular: No hyperdense vessel or unexpected calcification. Skull: Normal. Negative for fracture or focal lesion. Sinuses/Orbits: No acute finding. Other: None. IMPRESSION: 1. Stable head CT.  No acute intracranial process. Electronically Signed   By: Randa Ngo M.D.   On: 11/23/2020 17:56   DG Chest Port 1 View  Result Date: 11/23/2020 CLINICAL DATA:  Weakness, no dialysis for 13 days EXAM: PORTABLE CHEST 1 VIEW COMPARISON:  Chest radiograph 10/16/2020 FINDINGS: A right chest wall dialysis catheter is in stable position. The cardiomediastinal silhouette is stable. There is unchanged asymmetric elevation of the right hemidiaphragm. Aeration of the  right base is improved compared to the prior study. There is no focal consolidation or pulmonary edema on the current study. There is no pleural effusion or pneumothorax. Right upper quadrant and right axillary surgical clips are noted. There is no acute osseous abnormality. IMPRESSION: Improved aeration of the right base compared to 10/16/2020. No radiographic evidence of acute cardiopulmonary process currently. Electronically Signed   By: Valetta Mole M.D.   On: 11/23/2020 15:44    Procedures Procedures   Medications Ordered in ED Medications  cefTRIAXone (ROCEPHIN) 1 g in sodium chloride 0.9 % 100 mL IVPB (has no administration in time range)  sodium bicarbonate injection 50 mEq (has no administration in time  range)  sodium zirconium cyclosilicate (LOKELMA) packet 10 g (has no administration in time range)    ED Course  I have reviewed the triage vital signs and the nursing notes.  Pertinent labs & imaging results that were available during my care of the patient were reviewed by me and considered in my medical decision making (see chart for details).  Clinical Course as of 11/23/20 1827  Thu Nov 23, 2020  1812 WBC, UA(!): >50 [LJ]  1813 Bacteria, UA(!): FEW [LJ]  1813 Chalmers Guest): MODERATE [LJ]  1813 Hgb urine dipstick(!): LARGE [LJ]  1820 Patient discussed with Dr. Royce Macadamia with nephrology. They recommend 1 amp of bicarb, 10 of lokelma and they will dialyze the patient tomorrow. [LJ]    Clinical Course User Index [LJ] Rayna Sexton, PA-C   MDM Rules/Calculators/A&P                          Pt is a 55 y.o. male who presents to the emergency department due to altered mental status as well as missing dialysis since August 12.  Labs: CBC with a hemoglobin of 11.1, MCV of 103.1, RDW of 16.5, platelets of 402, lymphocytes of 0.4. CMP with a potassium of 5.5, chloride of 114, CO2 of 11, creatinine of 6.79, BUN of 72, alk phos of 166, GFR of 9, AST of 13, total protein of 9.5,  calcium of 8.7. Lactic acid of 0.6. APTT of 43. UA showing 5 ketones, 100 protein, moderate leukocytes, greater than 50 white blood cells, few bacteria. Respiratory panel is negative. Blood culture obtained.  Imaging: Chest x-ray shows improved aeration of the right base compared to July 18.  No radiographic evidence of acute cardiopulmonary process. CT scan of the head without contrast shows stable head CT.  No acute intracranial process.  I, Rayna Sexton, PA-C, personally reviewed and evaluated these images and lab results as part of my medical decision-making.  Patient currently A&O x3 but appears extremely fatigued.  Initially presented with a broken ostomy bag and was covered in his own feces.  His son states that the [place] reached out to him and let him know that his father had not gone to dialysis since August 12.  He states that he has been hallucinating intermittently for the past few days.  At times patient is concerned that people are breaking into his home and is also concerned that there were ticks in his catheter bag.  UA concerning for likely acute cystitis with hematuria.  Patient has an indwelling Foley catheter that has not been replaced for 1.5 months.  The nursing staff placed a new Foley catheter.  We will give 1 g of Rocephin.  His son does confirm that he is a DNR but states that he would like to move forward with dialysis.  Patient verbalized that he would like to be admitted and dialyzed.  This was discussed with the nephrology team.  They recommend 1 amp of bicarb, 10 of Lokelma, and they will dialyze the patient tomorrow.  Will discuss with the medicine team for admission for further work-up.  Note: Portions of this report may have been transcribed using voice recognition software. Every effort was made to ensure accuracy; however, inadvertent computerized transcription errors may be present.   Final Clinical Impression(s) / ED Diagnoses Final diagnoses:  Transient  alteration of awareness  Stage 5 chronic kidney disease on chronic dialysis (HCC)  Acute cystitis with hematuria  Hyperkalemia   Rx /  DC Orders ED Discharge Orders     None        Rayna Sexton, Hershal Coria 11/23/20 1828    Truddie Hidden, MD 11/23/20 [phone]

## 2020-11-23 NOTE — H&P (Addendum)
History and Physical    Chase [name] RWE:[id number] DOB: 02/21/1966 DOA: 11/23/2020  PCP: [name]   Patient coming from: Home  I have personally briefly reviewed patient's old medical records in [place]  Chief Complaint: Hallucinations, missed HD  HPI: Chase [name] is a 55 y.o. male with medical history significant for ESRD, CVA, Pseudomonas UTI, GI bleed, Hodgkin's lymphoma, chronic pain. Patient was brought to the ED reports that patient was hallucinating, and he has not been dialyzed since August 12.  At time of my evaluation, patient is awake alert oriented, is able to answer a some questions appropriately, but frequently goes off on a tangent And I unable to understand what he is saying.  ED provider obtained history for patient's son earlier, he reported that patient has been hallucinating, and he was called by the [place] that patient had not gotten dialyzed since August 12th -13 days ago. Patient tells me he did not go because initially he was sick, because people trespassed and entered his house, got arrested for drug charges.  He also tells me that children are in his house.  He tells me left to him alone he would not go on with dialysis, that he has no quality of life, but agrees to dialysis tomorrow.  Reports compliance with his medications and that he has been eating. Patient was also covered with feces in the ED, apparently his ostomy has been pulled out.  He is unable to tell me how or why this happened. He reports intermittent difficulty breathing with exertion, but no chest pain, no leg swelling.  He reports he makes a little amount of urine.     Recent prolonged hospitalization 7/8-7/26-acute on chronic renal failure, progressed to ESRD on dialysis, also revealed: Diverticulitis with abscess formation leading to complex urologic obstruction, resulting in diverting colostomy.     ED Course: Patient covered in feces, also chronic Foley catheter in  place, had not been changed in at least a month and a half and had about 200 mils of dark urine with sediment. WBC 7.8.  Temperature 97.1.  Blood pressure 110s to 120s.  O2 sats > 97% on room air.  Chest x-ray and Head CT without acute abnormality, lactic acid 0.6.  Potassium 5.5.  BUN 72.  UA suggestive of UTI.  IV ceftriaxone started.  Ostomy bag replaced in ED, Foley catheter changed. ED provider talked to Dr. Royce Macadamia, plan for HD in the morning, recommended Lokelma, bicarb.  Review of Systems: As per HPI all other systems reviewed and negative.  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 Attributed to chronic ibuprofen use   Hodgkin lymphoma, unspecified, unspecified site ([place]) 12/10/2012   Formatting of this note might be different from the original. In remission.   Hypokalemia    Hypomagnesemia    PTSD (post-traumatic stress disorder)    Renal disorder  CKD   Stroke [facility])    Urinary retention    due to bladder outlet obstruction    Past Surgical History:  Procedure Laterality Date   AV FISTULA PLACEMENT Left 10/16/2020   Procedure: LEFT ARM ARTERIOVENOUS BRACHIOBASILIC FISTULA CREATION;  Surgeon: [name], MD;  Location: [place];  Service: Vascular;  Laterality: Left;   CHOLECYSTECTOMY     COLONOSCOPY     COLONOSCOPY WITH PROPOFOL N/A 04/18/2020   Procedure: COLONOSCOPY WITH PROPOFOL;  Surgeon: Doran Stabler, MD;  Location: [place];  Service:  Gastroenterology;  Laterality: N/A;   CYSTOSCOPY W/ URETERAL STENT PLACEMENT N/A 11/14/2019   Procedure: CYSTOSCOPY WITH RETROGRADE PYELOGRAM bilateral Wyvonnia Dusky STENT PLACEMENT left fulguration bladder . cystogram;  Surgeon: Raynelle Bring, MD;  Location: [facility];  Service: Urology;  Laterality: N/A;   CYSTOSCOPY W/ URETERAL STENT PLACEMENT Bilateral 12/25/2019   Procedure: CYSTOSCOPY WITH bilateral  RETROGRADE PYELOGRAM/ leftURETERAL STENT PLACEMENT;  Surgeon: Lucas Mallow, MD;  Location: [facility];  Service: Urology;  Laterality: Bilateral;   CYSTOSCOPY W/ URETERAL STENT PLACEMENT Bilateral 07/06/2020   Procedure: CYSTOSCOPY WITH BILATERAL STENT EXCHANGE; BILATERAL PYELOGRAM RETROGRADE;  Surgeon: Raynelle Bring, MD;  Location: [facility];  Service: Urology;  Laterality: Bilateral;   CYSTOSCOPY WITH URETEROSCOPY AND STENT PLACEMENT N/A 04/18/2020   Procedure: CYSTOSCOPY WITH BILATERAL URETERAL STENT CHANGE PLACEMENT;  Surgeon: Ardis Hughs, MD;  Location: [place];  Service: Urology;  Laterality: N/A;   EPIGASTRIC HERNIA REPAIR N/A 04/18/2020   Procedure: HERNIA REPAIR EPIGASTRIC ADULT;  Surgeon: Michael Boston, MD;  Location: [place];  Service: General;  Laterality: N/A;   GASTRIC BYPASS     HERNIA REPAIR     INSERTION OF DIALYSIS CATHETER Right 10/16/2020   Procedure: INSERTION OF RIGHT INTERNAL JUGULAR TUNNELED DIALYSIS CATHETER WITH ULTRASOUND GUIDANCE;  Surgeon: [name], MD;  Location: [place];  Service: Vascular;  Laterality: Right;   IR FLUORO GUIDE CV LINE RIGHT  04/01/2020   IR NEPHRO TUBE REMOV/FL  04/25/2020   IR NEPHRO TUBE REMOV/FL  10/23/2020   IR NEPHRO TUBE REMOV/FL  10/23/2020   IR NEPHROSTOMY PLACEMENT LEFT  12/27/2019   IR NEPHROSTOMY PLACEMENT LEFT  04/01/2020   IR NEPHROSTOMY PLACEMENT LEFT  10/08/2020   IR NEPHROSTOMY PLACEMENT RIGHT  11/16/2019   IR NEPHROSTOMY PLACEMENT RIGHT  12/27/2019   IR NEPHROSTOMY PLACEMENT RIGHT  04/01/2020   IR NEPHROSTOMY PLACEMENT  RIGHT  10/08/2020   IR SINUS/FIST TUBE CHK-NON GI  12/03/2019   IR URETERAL STENT PLACEMENT EXISTING ACCESS RIGHT  12/08/2019   IR URETERAL STENT PLACEMENT EXISTING ACCESS RIGHT  01/26/2020   IR US GUIDANCE  10/08/2020   IR US GUIDANCE  10/08/2020   IR US GUIDE VASC ACCESS RIGHT  04/01/2020   LAPAROSCOPIC LOOP COLOSTOMY N/A 04/18/2020   Procedure: LAPAROSCOPIC SIGMOID COLOTOMY WITH COLOSTOMY;  Surgeon: Michael Boston, MD;  Location: [place];  Service: General;  Laterality: N/A;     reports that he has never smoked. He uses smokeless tobacco. He reports that he does not drink alcohol and does not use drugs.  Allergies  Allergen Reactions   Nsaids Other (See Comments)    Bleeding GI Ulceration   Ciprofloxacin Nausea And Vomiting    Family History  Problem Relation Age of Onset   Renal cancer Father        mets to liver and lungs and pancreas   Heart disease Father    Heart disease Paternal Grandfather  Colon cancer Maternal Grandmother    Colon cancer Paternal Uncle    Stomach cancer Neg Hx    Esophageal cancer Neg Hx    Pancreatic cancer Neg Hx     Prior to Admission medications   Medication Sig Start Date End Date Taking? Authorizing Provider  acetaminophen (TYLENOL) 500 MG tablet Take 1 tablet (500 mg total) by mouth every 6 (six) hours as needed for mild pain, fever or headache. 05/01/20   Samella Parr, NP  calcium carbonate (TUMS - DOSED IN MG ELEMENTAL CALCIUM) 500 MG chewable tablet Chew 1 tablet (200 mg of elemental calcium total) by mouth 3 (three) times daily. 05/01/20   Samella Parr, NP  cyanocobalamin (,VITAMIN B-12,) 1000 MCG/ML injection Inject 1,000 mcg into the muscle every 30 (thirty) days.  12/28/13   [provider]  DULoxetine (CYMBALTA) 30 MG capsule Take 1 capsule (30 mg total) by mouth 2 (two) times daily. 07/07/20 07/07/21  Raiford Noble Latif, DO  hydrOXYzine (ATARAX/VISTARIL) 10 MG tablet Take 1 tablet (10 mg total) by mouth 3 (three) times daily  as needed for itching or anxiety. 07/07/20   Raiford Noble Latif, DO  midodrine (PROAMATINE) 10 MG tablet Take 1 tablet (10 mg total) by mouth daily. 10/25/20   Shelly Coss, MD  Multiple Vitamin (MULTIVITAMIN WITH MINERALS) TABS tablet Take 1 tablet by mouth daily. 05/03/20   Samella Parr, NP  pantoprazole (PROTONIX) 20 MG tablet TAKE 1 TABLET (20 MG TOTAL) BY MOUTH 2 (TWO) TIMES DAILY. 05/03/20 05/03/21  Samella Parr, NP  polycarbophil (FIBERCON) 625 MG tablet Take 1 tablet (625 mg total) by mouth 2 (two) times daily. Patient not taking: Reported on 10/07/2020 05/01/20   Samella Parr, NP  polyethylene glycol (MIRALAX / GLYCOLAX) 17 g packet Take 17 g by mouth daily as needed for moderate constipation. 07/07/20   Raiford Noble Latif, DO  silodosin (RAPAFLO) 8 MG CAPS capsule Take 1 capsule (8 mg total) by mouth daily with breakfast. 07/07/20   Raiford Noble Latif, DO  sodium bicarbonate 650 MG tablet TAKE 1 TABLET (650 MG TOTAL) BY MOUTH 2 (TWO) TIMES DAILY. 05/03/20 05/03/21  Samella Parr, NP  Vitamin D3 (VITAMIN D) 25 MCG tablet TAKE 800 UNITS BY MOUTH DAILY. Patient not taking: Reported on 10/07/2020 05/03/20 05/03/21  Samella Parr, NP    Physical Exam: Vitals:   11/23/20 1614 11/23/20 1703 11/23/20 1730 11/23/20 1830  BP:  120/74 (!) 113/92 136/82  Pulse: 70 72 71 74  Resp: 19 20 18 20   Temp:      TempSrc:      SpO2: 100% 100% 100% 100%  Weight:      Height:        Constitutional: NAD, calm, comfortable Vitals:   11/23/20 1614 11/23/20 1703 11/23/20 1730 11/23/20 1830  BP:  120/74 (!) 113/92 136/82  Pulse: 70 72 71 74  Resp: 19 20 18 20   Temp:      TempSrc:      SpO2: 100% 100% 100% 100%  Weight:      Height:       Eyes: PERRL, lids and conjunctivae normal ENMT: Mucous membranes are moist.  Neck: normal, supple, no masses, no thyromegaly Respiratory: clear to auscultation bilaterally, no wheezing, no crackles. Normal respiratory effort. No accessory muscle use.   Cardiovascular: HD access right upper chest, surrounding skin clean and dry without erythema or discharge, regular rate and rhythm, no murmurs / rubs / gallops. No  extremity edema. 2+ pedal pulses.   Abdomen: Ostomy left abdomen, bag filled with soft stool.  No tenderness, no masses palpated. No hepatosplenomegaly. Bowel sounds positive.  Musculoskeletal: no clubbing / cyanosis. No joint deformity upper and lower extremities. Good ROM, no contractures. Normal muscle tone.  Skin: Tattoos, no rashes, lesions, ulcers. No induration Neurologic: Moving all extremities spontaneously, speech clear and fluent without evidence of aphasia,.  No facial asymmetry. Psychiatric: Speech with intermittent delusions/hallucinations.  Normal judgment and insight. Alert and oriented x 3. Normal mood.   Labs on Admission: I have personally reviewed following labs and imaging studies  CBC: Recent Labs  Lab 11/23/20 1554  WBC 7.8  NEUTROABS 6.8  HGB 11.1*  HCT 37.1*  MCV 103.1*  PLT 101*   Basic Metabolic Panel: Recent Labs  Lab 11/23/20 1554  NA 136  K 5.5*  CL 114*  CO2 11*  GLUCOSE 90  BUN 72*  CREATININE 6.79*  CALCIUM 8.7*   Liver Function Tests: Recent Labs  Lab 11/23/20 1554  AST 13*  ALT 16  ALKPHOS 166*  BILITOT 0.8  PROT 9.5*  ALBUMIN 3.5   Coagulation Profile: Recent Labs  Lab 11/23/20 1554  INR 1.2   Urine analysis:    Component Value Date/Time   COLORURINE YELLOW 11/23/2020 1725   APPEARANCEUR CLOUDY (A) 11/23/2020 1725   LABSPEC 1.014 11/23/2020 1725   PHURINE 5.0 11/23/2020 1725   GLUCOSEU NEGATIVE 11/23/2020 1725   HGBUR LARGE (A) 11/23/2020 1725   BILIRUBINUR NEGATIVE 11/23/2020 1725   KETONESUR 5 (A) 11/23/2020 1725   PROTEINUR 100 (A) 11/23/2020 1725   NITRITE NEGATIVE 11/23/2020 1725   LEUKOCYTESUR MODERATE (A) 11/23/2020 1725    Radiological Exams on Admission: CT HEAD WO CONTRAST (5MM)  Result Date: 11/23/2020 CLINICAL DATA:  Delirium,  hallucinations EXAM: CT HEAD WITHOUT CONTRAST TECHNIQUE: Contiguous axial images were obtained from the base of the skull through the vertex without intravenous contrast. COMPARISON:  06/27/2020 FINDINGS: Brain: No acute infarct or hemorrhage. Stable small chronic right occipital cortical infarct. Lateral ventricles and midline structures are unremarkable. There are no acute extra-axial fluid collections. There is no mass effect. Vascular: No hyperdense vessel or unexpected calcification. Skull: Normal. Negative for fracture or focal lesion. Sinuses/Orbits: No acute finding. Other: None. IMPRESSION: 1. Stable head CT.  No acute intracranial process. Electronically Signed   By: Randa Ngo M.D.   On: 11/23/2020 17:56   DG Chest Port 1 View  Result Date: 11/23/2020 CLINICAL DATA:  Weakness, no dialysis for 13 days EXAM: PORTABLE CHEST 1 VIEW COMPARISON:  Chest radiograph 10/16/2020 FINDINGS: A right chest wall dialysis catheter is in stable position. The cardiomediastinal silhouette is stable. There is unchanged asymmetric elevation of the right hemidiaphragm. Aeration of the right base is improved compared to the prior study. There is no focal consolidation or pulmonary edema on the current study. There is no pleural effusion or pneumothorax. Right upper quadrant and right axillary surgical clips are noted. There is no acute osseous abnormality. IMPRESSION: Improved aeration of the right base compared to 10/16/2020. No radiographic evidence of acute cardiopulmonary process currently. Electronically Signed   By: Valetta Mole M.D.   On: 11/23/2020 15:44    EKG: Pending.   Assessment/Plan Principal Problem:   Metabolic encephalopathy Active Problems:   Chronic pain   BPH (benign prostatic hyperplasia)   History of CVA (cerebrovascular accident)   DNR (do not resuscitate) discussion   ESRD (end stage renal disease) ([place])  Metabolic encephalopathy-intermittent hallucinations/delusions,  otherwise awake  alert and oriented.  The setting of missing dialysis for 13 days, and UTI.  Head CT without acute abnormality.  Chest x-ray unremarkable. -For HD in the morning -Antibiotics  Complicated urinary tract infection-chronic indwelling Foley catheter, history of Pseudomonas UTI.  10/2019 grew Pseudomonas and pansensitive E. Coli.  Afebrile without leukocytosis.  Rules out for sepsis. -Will start cefepime -Obtain urine culture - follow-up blood cultures  ESRD-missed HD for 13 days, last HD 12 August.  Potassium 5.5.  BUN 72.  Bicarb 11.  Blood pressure stable.  No suggestion of volume overload. Still making some urine - EDP talked to nephrology will see in the morning, Lokelma 10g given, 1 amp bicarb given. -Will need goals of care discussion -Resume home bicarb  BPH-chronic indwelling Foley catheter, changed in ED  CVA-stable.  Not on statins.  Ostomy status  DVT prophylaxis: Heparin Code Status: DNR- EDP confirmed with patient's son, and this is consistent with prior documentation in chart Family Communication: None present at this time, son present previously.   Disposition Plan: > 2 days Consults called: Nephrology Admission status: Inpatient, telemetry I certify that at the point of admission it is my clinical judgment that the patient will require inpatient hospital care spanning beyond 2 midnights from the point of admission due to high intensity of service, high risk for further deterioration and high frequency of surveillance required. The following factors support the patient status of inpatient:    Bethena Roys MD [facility]  11/23/2020, 8:19 PM

## 2020-11-23 NOTE — ED Notes (Signed)
Patient transported to CT 

## 2020-11-24 DIAGNOSIS — E440 Moderate protein-calorie malnutrition: Secondary | ICD-10-CM | POA: Insufficient documentation

## 2020-11-24 LAB — CBC
HCT: 34.7 % — ABNORMAL LOW (ref 39.0–52.0)
Hemoglobin: 10.6 g/dL — ABNORMAL LOW (ref 13.0–17.0)
MCH: 31.5 pg (ref 26.0–34.0)
MCHC: 30.5 g/dL (ref 30.0–36.0)
MCV: 103 fL — ABNORMAL HIGH (ref 80.0–100.0)
Platelets: 401 10*3/uL — ABNORMAL HIGH (ref 150–400)
RBC: 3.37 MIL/uL — ABNORMAL LOW (ref 4.22–5.81)
RDW: 16.4 % — ABNORMAL HIGH (ref 11.5–15.5)
WBC: 7.3 10*3/uL (ref 4.0–10.5)
nRBC: 0 % (ref 0.0–0.2)

## 2020-11-24 LAB — BASIC METABOLIC PANEL
Anion gap: 14 (ref 5–15)
BUN: 77 mg/dL — ABNORMAL HIGH (ref 6–20)
CO2: 13 mmol/L — ABNORMAL LOW (ref 22–32)
Calcium: 9 mg/dL (ref 8.9–10.3)
Chloride: 113 mmol/L — ABNORMAL HIGH (ref 98–111)
Creatinine, Ser: 6.9 mg/dL — ABNORMAL HIGH (ref 0.61–1.24)
GFR, Estimated: 9 mL/min — ABNORMAL LOW (ref >60)
Glucose, Bld: 84 mg/dL (ref 70–99)
Potassium: 5.3 mmol/L — ABNORMAL HIGH (ref 3.5–5.1)
Sodium: 140 mmol/L (ref 135–145)

## 2020-11-24 LAB — HEPATITIS B SURFACE ANTIGEN: Hepatitis B Surface Ag: NONREACTIVE

## 2020-11-24 LAB — HIV ANTIBODY (ROUTINE TESTING W REFLEX): HIV Screen 4th Generation wRfx: NONREACTIVE

## 2020-11-24 MED ORDER — ASPIRIN EC 81 MG PO TBEC
81.0000 mg | DELAYED_RELEASE_TABLET | Freq: Every day | ORAL | Status: DC
  Administered 2020-11-25 – 2020-12-01 (×6): 81 mg via ORAL
  Filled 2020-11-24 (×6): qty 1

## 2020-11-24 MED ORDER — ATORVASTATIN CALCIUM 10 MG PO TABS
10.0000 mg | ORAL_TABLET | Freq: Every day | ORAL | Status: DC
  Administered 2020-11-24 – 2020-12-01 (×8): 10 mg via ORAL
  Filled 2020-11-24 (×8): qty 1

## 2020-11-24 MED ORDER — SODIUM CHLORIDE 0.9 % IV SOLN
1.0000 g | INTRAVENOUS | Status: DC
  Administered 2020-11-25 (×2): 1 g via INTRAVENOUS
  Filled 2020-11-24 (×4): qty 1

## 2020-11-24 MED ORDER — CHLORHEXIDINE GLUCONATE CLOTH 2 % EX PADS
6.0000 | MEDICATED_PAD | Freq: Every day | CUTANEOUS | Status: DC
  Administered 2020-11-24 – 2020-12-01 (×8): 6 via TOPICAL

## 2020-11-24 MED ORDER — OXYCODONE-ACETAMINOPHEN 7.5-325 MG PO TABS
1.0000 | ORAL_TABLET | ORAL | Status: DC | PRN
  Administered 2020-11-24 – 2020-12-01 (×39): 1 via ORAL
  Filled 2020-11-24 (×39): qty 1

## 2020-11-24 MED ORDER — HEPARIN SODIUM (PORCINE) 1000 UNIT/ML DIALYSIS
20.0000 [IU]/kg | INTRAMUSCULAR | Status: DC | PRN
  Administered 2020-11-24: 3200 [IU] via INTRAVENOUS_CENTRAL

## 2020-11-24 MED ORDER — ALTEPLASE 2 MG IJ SOLR: 2.0000 mg | Freq: Once | INTRAMUSCULAR | Status: DC | PRN

## 2020-11-24 MED ORDER — SODIUM CHLORIDE 0.9 % IV SOLN: 100.0000 mL | INTRAVENOUS | Status: DC | PRN

## 2020-11-24 MED ORDER — HYDROCODONE-ACETAMINOPHEN 5-325 MG PO TABS
1.0000 | ORAL_TABLET | Freq: Three times a day (TID) | ORAL | Status: DC | PRN
  Administered 2020-11-24: 1 via ORAL
  Filled 2020-11-24: qty 1

## 2020-11-24 MED ORDER — GABAPENTIN 100 MG PO CAPS
100.0000 mg | ORAL_CAPSULE | Freq: Two times a day (BID) | ORAL | Status: DC
  Administered 2020-11-24 – 2020-12-01 (×14): 100 mg via ORAL
  Filled 2020-11-24 (×14): qty 1

## 2020-11-24 MED ORDER — CHLORHEXIDINE GLUCONATE CLOTH 2 % EX PADS
6.0000 | MEDICATED_PAD | Freq: Every day | CUTANEOUS | Status: DC
  Administered 2020-11-24: 6 via TOPICAL

## 2020-11-24 MED ORDER — DULOXETINE HCL 30 MG PO CPEP
30.0000 mg | ORAL_CAPSULE | Freq: Every day | ORAL | Status: DC
  Administered 2020-11-24 – 2020-12-01 (×8): 30 mg via ORAL
  Filled 2020-11-24 (×8): qty 1

## 2020-11-24 MED ORDER — PROSOURCE PLUS PO LIQD
30.0000 mL | Freq: Three times a day (TID) | ORAL | Status: DC
  Administered 2020-11-24 (×2): 30 mL via ORAL
  Filled 2020-11-24 (×9): qty 30

## 2020-11-24 MED ORDER — HYDROXYZINE HCL 25 MG PO TABS
25.0000 mg | ORAL_TABLET | Freq: Three times a day (TID) | ORAL | Status: DC | PRN
  Administered 2020-11-26 – 2020-12-01 (×9): 25 mg via ORAL
  Filled 2020-11-24 (×10): qty 1

## 2020-11-24 NOTE — Progress Notes (Signed)
Initial Nutrition Assessment  DOCUMENTATION CODES:   Underweight  INTERVENTION:  ProSource Plus 30 ml TID (each 30 ml provides 100 kcal, 15 gr protein)    NUTRITION DIAGNOSIS:   Moderate Malnutrition related to chronic illness (ESRD w/HD) as evidenced by energy intake < 75% for > or equal to 1 month, moderate fat depletion, moderate muscle depletion, severe muscle depletion.   GOAL:  Patient will meet greater than or equal to 90% of their needs   MONITOR:  PO intake, Supplement acceptance, Labs, I & O's, Weight trends  REASON FOR ASSESSMENT:   Malnutrition Screening Tool    ASSESSMENT: Patient is an underweight 55 yo with hx of ESRD/HD, Anemia, Hodgkin's lymphoma, CVA, COVID-19,PTSD, GIB, Chronic pain. Presents with failure to thrive.   Per Nephrology pt agreeable to retry HD but ok with comfort care if poorly tolerated. Scheduled for today and tomorrow.  Patient receiving a Renal diet and po intake is poor 35% of breakfast and 25% of lunch. Patient says he is eating poorly because of the food (has no flavor) and he doesn't like Ms Deliah Boston. At home patient not eating well 2 days prior to admission due to social issues?   Patient refuses Nepro, Ensure and Boost but will try protein modular.   Question current weight 49.3 kg. He weighed 63.6 kg 10/24/20 and 65.7 kg in April. Estimated needs based on July weight. Recommend re-weight.   Medications reviewed and include: sodium bicarbonate, Protonix. Drips: Maxipime->9/1    Labs: BMP Latest Ref Rng & Units 11/24/2020 11/23/2020 10/24/2020  Glucose 70 - 99 mg/dL 84 90 93  BUN 6 - 20 mg/dL 77(H) 72(H) 59(H)  Creatinine 0.61 - 1.24 mg/dL 6.90(H) 6.79(H) 4.79(H)  Sodium 135 - 145 mmol/L 140 136 138  Potassium 3.5 - 5.1 mmol/L 5.3(H) 5.5(H) 3.8  Chloride 98 - 111 mmol/L 113(H) 114(H) 113(H)  CO2 22 - 32 mmol/L 13(L) 11(L) 21(L)  Calcium 8.9 - 10.3 mg/dL 9.0 8.7(L) 7.4(L)      NUTRITION - FOCUSED PHYSICAL EXAM: Nutrition-Focused  physical exam completed. Findings are moderate buccal, orbital fat depletion, moderate clavicle, severe dorsal muscle depletion, and no LE edema.     Diet Order:   Diet Order             Diet renal with fluid restriction Fluid restriction: 1200 mL Fluid; Room service appropriate? Yes; Fluid consistency: Thin  Diet effective now                   EDUCATION NEEDS:  Education needs have been addressed  Skin:  Skin Assessment: Reviewed RN Assessment  Last BM:  8/26 ostomy  Height:   Ht Readings from Last 1 Encounters:  11/23/20 5\' 8"  (1.727 m)    Weight:   Wt Readings from Last 1 Encounters:  11/23/20 49.3 kg    Ideal Body Weight:   70 kg  BMI:  Body mass index is 16.53 kg/m.  Estimated Nutritional Needs:   Kcal:  1900-2100  Protein:  77-83 gr  Fluid:  1200 ml   Colman Cater MS,RD,CSG,LDN Contact: Shea Evans

## 2020-11-24 NOTE — Consult Note (Signed)
[facility] Renal Consultation Note    Indication for Consultation:  Management of ESRD/hemodialysis; anemia, hypertension/volume and secondary hyperparathyroidism  HPI: Chase [name] is a 55 y.o. 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  Apparently his SW at the [place] notified his son that he had not gone to dialysis for 2 weeks.  Per records from the ED, he had been hallucinating and paranoid.  He was admitted and we were asked to provide dialysis during his hospitalization.  Chase [name] is awake and alert this morning and reports that he had not gone to dialysis for multiple reasons.  "Some days I didn't feel well and was too weak and other times there were family issues that came up".   He was able to tell me that he is going to have a conversation with his son regarding on going dialysis.  He acknowledges that he has "No quality of life, I just ride in a white van to medical facilities".  He is willing to try HD again but if he cannot tolerate it he is ok with transitioning to comfort care.  Unfortunately his son was not present this morning.   Past Medical History:  Diagnosis Date   Anemia    Cervical radiculopathy    with left sided weakness   Chemical exposure    service related injury   Chronic pain    CKD (chronic kidney disease), stage IV ([place]) 10/17/2019   COVID-19    Dyspnea    GERD (gastroesophageal reflux disease)    GIB (gastrointestinal bleeding)    Headache    Heart attack ([place])    History of CVA (cerebrovascular accident) 12/31/2019   History of gastric bypass 08/24/2019   History of stomach ulcers 05/19/2013   Formatting of this note might be different from the original. Attributed to chronic ibuprofen use   Hodgkin lymphoma, unspecified,  unspecified site ([place]) 12/10/2012   Formatting of this note might be different from the original. In remission.   Hypokalemia    Hypomagnesemia    PTSD (post-traumatic stress disorder)    Renal disorder    CKD   Stroke [facility])    Urinary retention    due to bladder outlet obstruction   Past Surgical History:  Procedure Laterality Date   AV FISTULA PLACEMENT Left 10/16/2020   Procedure: LEFT ARM ARTERIOVENOUS BRACHIOBASILIC FISTULA CREATION;  Surgeon: [name], MD;  Location: [place];  Service: Vascular;  Laterality: Left;   CHOLECYSTECTOMY     COLONOSCOPY     COLONOSCOPY WITH PROPOFOL N/A 04/18/2020   Procedure: COLONOSCOPY WITH PROPOFOL;  Surgeon: Doran Stabler, MD;  Location: [place];  Service: Gastroenterology;  Laterality: N/A;   CYSTOSCOPY W/ URETERAL STENT PLACEMENT N/A 11/14/2019   Procedure: CYSTOSCOPY WITH RETROGRADE PYELOGRAM bilateral Wyvonnia Dusky STENT PLACEMENT left fulguration bladder . cystogram;  Surgeon: Raynelle Bring, MD;  Location: [facility];  Service: Urology;  Laterality: N/A;   CYSTOSCOPY W/ URETERAL STENT PLACEMENT Bilateral 12/25/2019   Procedure: CYSTOSCOPY WITH bilateral  RETROGRADE PYELOGRAM/ leftURETERAL STENT PLACEMENT;  Surgeon: Lucas Mallow, MD;  Location: [facility];  Service: Urology;  Laterality: Bilateral;   CYSTOSCOPY W/ URETERAL STENT PLACEMENT Bilateral 07/06/2020   Procedure: CYSTOSCOPY WITH BILATERAL STENT EXCHANGE; BILATERAL PYELOGRAM RETROGRADE;  Surgeon: Raynelle Bring, MD;  Location[facility];  Service: Urology;  Laterality: Bilateral;   CYSTOSCOPY WITH URETEROSCOPY AND STENT PLACEMENT N/A 04/18/2020   Procedure: CYSTOSCOPY WITH BILATERAL URETERAL STENT CHANGE PLACEMENT;  Surgeon: Ardis Hughs, MD;  Location: [facility];  Service: Urology;  Laterality: N/A;   EPIGASTRIC HERNIA REPAIR N/A 04/18/2020   Procedure: HERNIA REPAIR EPIGASTRIC ADULT;  Surgeon: Michael Boston, MD;  Location: [place];  Service: General;  Laterality: N/A;    GASTRIC BYPASS     HERNIA REPAIR     INSERTION OF DIALYSIS CATHETER Right 10/16/2020   Procedure: INSERTION OF RIGHT INTERNAL JUGULAR TUNNELED DIALYSIS CATHETER WITH ULTRASOUND GUIDANCE;  Surgeon: [name], MD;  Location: [place];  Service: Vascular;  Laterality: Right;   IR FLUORO GUIDE CV LINE RIGHT  04/01/2020   IR NEPHRO TUBE REMOV/FL  04/25/2020   IR NEPHRO TUBE REMOV/FL  10/23/2020   IR NEPHRO TUBE REMOV/FL  10/23/2020   IR NEPHROSTOMY PLACEMENT LEFT  12/27/2019   IR NEPHROSTOMY PLACEMENT LEFT  04/01/2020   IR NEPHROSTOMY PLACEMENT LEFT  10/08/2020   IR NEPHROSTOMY PLACEMENT RIGHT  11/16/2019   IR NEPHROSTOMY PLACEMENT RIGHT  12/27/2019   IR NEPHROSTOMY PLACEMENT RIGHT  04/01/2020   IR NEPHROSTOMY PLACEMENT RIGHT  10/08/2020   IR SINUS/FIST TUBE CHK-NON GI  12/03/2019   IR URETERAL STENT PLACEMENT EXISTING ACCESS RIGHT  12/08/2019   IR URETERAL STENT PLACEMENT EXISTING ACCESS RIGHT  01/26/2020   IR US GUIDANCE  10/08/2020   IR US GUIDANCE  10/08/2020   IR US GUIDE VASC ACCESS RIGHT  04/01/2020   LAPAROSCOPIC LOOP COLOSTOMY N/A 04/18/2020   Procedure: LAPAROSCOPIC SIGMOID COLOTOMY WITH COLOSTOMY;  Surgeon: Michael Boston, MD;  Location: [facility];  Service: General;  Laterality: N/A;   Family History:   Family History  Problem Relation Age of Onset   Renal cancer Father        mets to liver and lungs and pancreas   Heart disease Father    Heart disease Paternal Grandfather    Colon cancer Maternal Grandmother    Colon cancer Paternal Uncle    Stomach cancer Neg Hx    Esophageal cancer Neg Hx    Pancreatic cancer Neg Hx    Social History:  reports that he has never smoked. He uses smokeless tobacco. He reports that he does not drink alcohol and does not use drugs. Allergies  Allergen Reactions   Nsaids Other (See Comments)    Bleeding GI Ulceration   Ciprofloxacin Nausea And Vomiting   Prior to Admission medications   Medication Sig Start Date End Date Taking?  Authorizing Provider  acetaminophen (TYLENOL) 500 MG tablet Take 1 tablet (500 mg total) by mouth every 6 (six) hours as needed for mild pain, fever or headache. 05/01/20   Samella Parr, NP  calcium carbonate (TUMS - DOSED IN MG ELEMENTAL CALCIUM) 500 MG chewable tablet Chew 1 tablet (200 mg of elemental calcium total) by mouth 3 (three) times daily. 05/01/20   Samella Parr, NP  cyanocobalamin (,VITAMIN B-12,) 1000 MCG/ML injection Inject 1,000 mcg into the muscle every 30 (thirty) days.  12/28/13   [provider]  DULoxetine (CYMBALTA) 30 MG capsule Take 1 capsule (30 mg total) by mouth 2 (two) times daily. 07/07/20 07/07/21  Raiford Noble Latif, DO  gabapentin (NEURONTIN) 100 MG capsule Take 100 mg by mouth. Take 1 capsule by mouth on Mon,Wed, and Friday for nerve pain. Take after dialysis 10/27/20   [provider]  HYDROcodone-acetaminophen (NORCO/VICODIN) 5-325 MG tablet TAKE 1 TABLET BY MOUTH TWICE A DAY FOR CHRONIC PAIN (8/3W) 11/01/20   [provider]  hydrOXYzine (ATARAX/VISTARIL) 10 MG tablet Take 1 tablet (10 mg total) by mouth 3 (three) times daily as needed for itching or anxiety. 07/07/20   Raiford Noble Latif, DO  midodrine (PROAMATINE) 10 MG tablet Take 1 tablet (10 mg total) by mouth daily. 10/25/20   Shelly Coss, MD  Multiple Vitamin (MULTIVITAMIN WITH MINERALS) TABS tablet Take 1 tablet by mouth daily. 05/03/20   Samella Parr, NP  Naloxone HCl (NARCAN NA) Place 4 mg into the nose once. Spray 1 spray into one nostril as directed  ( turn person on side after dose). If no response in 2 to 3 minutes or person responds but relapes a new sprays,repeat  using  a new spray.    [provider]  pantoprazole (PROTONIX) 20 MG tablet TAKE 1 TABLET (20 MG TOTAL) BY MOUTH 2 (TWO) TIMES DAILY. 05/03/20 05/03/21  Samella Parr, NP  polycarbophil (FIBERCON) 625 MG tablet Take 1 tablet (625 mg total) by mouth 2 (two) times daily. Patient not taking: Reported on  10/07/2020 05/01/20   Samella Parr, NP  polyethylene glycol (MIRALAX / GLYCOLAX) 17 g packet Take 17 g by mouth daily as needed for moderate constipation. 07/07/20   Raiford Noble Latif, DO  prazosin (MINIPRESS) 1 MG capsule Take 1 mg by mouth at bedtime.    [provider]  silodosin (RAPAFLO) 8 MG CAPS capsule Take 1 capsule (8 mg total) by mouth daily with breakfast. 07/07/20   Raiford Noble Latif, DO  sodium bicarbonate 650 MG tablet TAKE 1 TABLET (650 MG TOTAL) BY MOUTH 2 (TWO) TIMES DAILY. 05/03/20 05/03/21  Samella Parr, NP  tamsulosin (FLOMAX) 0.4 MG CAPS capsule Take 0.4 mg by mouth at bedtime.    [provider]  Vitamin D3 (VITAMIN D) 25 MCG tablet TAKE 800 UNITS BY MOUTH DAILY. Patient not taking: Reported on 10/07/2020 05/03/20 05/03/21  Samella Parr, NP   Current Facility-Administered Medications  Medication Dose Route Frequency Provider Last Rate Last Admin   acetaminophen (TYLENOL) tablet 650 mg  650 mg Oral Q6H PRN Emokpae, Ejiroghene E, MD       Or   acetaminophen (TYLENOL) suppository 650 mg  650 mg Rectal Q6H PRN Emokpae, Ejiroghene E, MD       ceFEPIme (MAXIPIME) 2 g in sodium chloride 0.9 % 100 mL IVPB  2 g Intravenous Q M,W,F-HD Emokpae, Ejiroghene E, MD       Chlorhexidine Gluconate Cloth 2 % PADS 6 each  6 each Topical Daily Emokpae, Ejiroghene E, MD       heparin injection 5,000 Units  5,000 Units Subcutaneous Q8H Emokpae, Ejiroghene E, MD   5,000 Units at 11/24/20 0536   HYDROcodone-acetaminophen (NORCO/VICODIN) 5-325 MG per tablet 1 tablet  1 tablet Oral Q12H PRN Emokpae, Ejiroghene E, MD   1 tablet at 11/24/20 0531   midodrine (PROAMATINE) tablet 10 mg  10 mg Oral Daily Emokpae, Ejiroghene E, MD       pantoprazole (PROTONIX) EC tablet 40 mg  40 mg Oral Daily Emokpae, Ejiroghene E, MD       polyethylene glycol (MIRALAX / GLYCOLAX) packet 17 g  17 g Oral Daily PRN Emokpae, Ejiroghene E, MD       sodium bicarbonate tablet 650 mg  650 mg Oral BID Emokpae,  Leanne Chang, MD  Labs: Basic Metabolic Panel: Recent Labs  Lab 11/23/20 1554 11/24/20 0620  NA 136 140  K 5.5* 5.3*  CL 114* 113*  CO2 11* 13*  GLUCOSE 90 84  BUN 72* 77*  CREATININE 6.79* 6.90*  CALCIUM 8.7* 9.0   Liver Function Tests: Recent Labs  Lab 11/23/20 1554  AST 13*  ALT 16  ALKPHOS 166*  BILITOT 0.8  PROT 9.5*  ALBUMIN 3.5   No results for input(s): LIPASE, AMYLASE in the last 168 hours. No results for input(s): AMMONIA in the last 168 hours. CBC: Recent Labs  Lab 11/23/20 1554 11/24/20 0620  WBC 7.8 7.3  NEUTROABS 6.8  --   HGB 11.1* 10.6*  HCT 37.1* 34.7*  MCV 103.1* 103.0*  PLT 402* 401*   Cardiac Enzymes: No results for input(s): CKTOTAL, CKMB, CKMBINDEX, TROPONINI in the last 168 hours. CBG: No results for input(s): GLUCAP in the last 168 hours. Iron Studies: No results for input(s): IRON, TIBC, TRANSFERRIN, FERRITIN in the last 72 hours. Studies/Results: CT HEAD WO CONTRAST (5MM)  Result Date: 11/23/2020 CLINICAL DATA:  Delirium, hallucinations EXAM: CT HEAD WITHOUT CONTRAST TECHNIQUE: Contiguous axial images were obtained from the base of the skull through the vertex without intravenous contrast. COMPARISON:  06/27/2020 FINDINGS: Brain: No acute infarct or hemorrhage. Stable small chronic right occipital cortical infarct. Lateral ventricles and midline structures are unremarkable. There are no acute extra-axial fluid collections. There is no mass effect. Vascular: No hyperdense vessel or unexpected calcification. Skull: Normal. Negative for fracture or focal lesion. Sinuses/Orbits: No acute finding. Other: None. IMPRESSION: 1. Stable head CT.  No acute intracranial process. Electronically Signed   By: Randa Ngo M.D.   On: 11/23/2020 17:56   DG Chest Port 1 View  Result Date: 11/23/2020 CLINICAL DATA:  Weakness, no dialysis for 13 days EXAM: PORTABLE CHEST 1 VIEW COMPARISON:  Chest radiograph 10/16/2020 FINDINGS: A right chest wall  dialysis catheter is in stable position. The cardiomediastinal silhouette is stable. There is unchanged asymmetric elevation of the right hemidiaphragm. Aeration of the right base is improved compared to the prior study. There is no focal consolidation or pulmonary edema on the current study. There is no pleural effusion or pneumothorax. Right upper quadrant and right axillary surgical clips are noted. There is no acute osseous abnormality. IMPRESSION: Improved aeration of the right base compared to 10/16/2020. No radiographic evidence of acute cardiopulmonary process currently. Electronically Signed   By: Valetta Mole M.D.   On: 11/23/2020 15:44    ROS: Pertinent items are noted in HPI. Physical Exam: Vitals:   11/23/20 2100 11/23/20 2156 11/24/20 0359 11/24/20 0700  BP: (!) 113/57 123/66 123/72 114/66  Pulse: 75 66 67 66  Resp:  19 19 17   Temp:  98 F (36.7 C) 98.3 F (36.8 C) 98.2 F (36.8 C)  TempSrc:    Oral  SpO2: 98% 95% 100% 100%  Weight:  49.3 kg    Height:  5\' 8"  (1.727 m)        Weight change:   Intake/Output Summary (Last 24 hours) at 11/24/2020 0921 Last data filed at 11/24/2020 0846 Gross per 24 hour  Intake 240 ml  Output 1858 ml  Net -1618 ml   BP 114/66 (BP Location: Right Arm)   Pulse 66   Temp 98.2 F (36.8 C) (Oral)   Resp 17   Ht 5\' 8"  (1.727 m)   Wt 49.3 kg   SpO2 100%   BMI 16.53 kg/m  General appearance:  alert, cooperative, appears older than stated age, and no distress Head: Normocephalic, without obvious abnormality, atraumatic Resp: clear to auscultation bilaterally Cardio: regular rate and rhythm, S1, S2 normal, no murmur, click, rub or gallop GI: soft, non-tender; bowel sounds normal; no masses,  no organomegaly and ostomy in place Extremities: extremities normal, atraumatic, no cyanosis or edema and LUE BVT +T/B Dialysis Access:  Dialysis Orders: Center: [facility]  on TTS schedule  Assessment/Plan:  Failure to thrive - pt with an  extensive and complex medical history as well as psych.  Has not gone to outpatient HD for 2 weeks and brought in with AMS and hallucinations.  He is currently awake and alert and has good insight into his medical conditions and quality of life.  He is willing to retry HD but if he cannot tolerate it he would be fine with transitioning to comfort care, however he wants to be the one to decide.  ESRD -  will plan for HD but will treat him as if he is a new start to avoid dialysis dysequilibrium syndrome.  Will have short session today and again tomorrow.  Need to find his outpatient orders and verify he is on TTS schedule and not MWF.  Hypertension/volume  - stable  Anemia  - continue to follow and start ESA if continues to drop  Metabolic bone disease -  resume home meds  Nutrition - renal diet, protein supplementation Hyperkalemia - due to missed HD.  Plan for HD today.  Given lokelma Disposition - await to see how he tolerates HD and would recommend palliative care to be on board as well.   Donetta Potts, MD [place] Pager [phone] 11/24/2020, 9:21 AM

## 2020-11-24 NOTE — Clinical Social Work Note (Signed)
[facility] notification done [phone]

## 2020-11-24 NOTE — Procedures (Addendum)
   HEMODIALYSIS TREATMENT NOTE:  Pt agrees to a trial of hemodialysis this admission but he is quite clear that dialysis is physically and emotionally taxing.  He shares that the treatments leave him feeling even worse than he felt after chemotherapy for HL.  He feels "like a shell" after treatment and by the time he starts to feel like himself again, it's time to go back.  "At least chemo came to an end; this is forever."  He repeated "if it were up to me, I wouldn't do this", clarifying that he wishes to be considerate of his two sons, neither of whom live locally ([place], in [place] and [place]. in [place])  He describes how his wife passed a few years ago "on the ventilator" and how he does not wish for his sons to be burdened by "any big decisions" regarding his own care.    We spent some time validating all of these feelings before starting treatment.  Dr.Courage was present for some of this discussion.  Permission was granted for his sons to visit while pt is undergoing HD "so they can see" what is involved.  We also talked about PCM so he would understand that support is available should he decide to stop dialysis. He understands that stopping renal replacement therapy equates with eventual death "and I'm fine with that."  Catheter entry site was unremarkable and limbs aspirated and flushed without resistance.  Dressing was changed and low-flow treatment was initiated without problems.  Hemodynamically stable without any ultrafiltration as per order.  Sons arrived to visit when pt had 40 minutes remaining.  We spoke at length before having a telephone meeting with Dr. Joesph Fillers during which pt declared his desire for a natural death without defibrillation and/or intubation.  Sons understand the consequences and agree to support their father's decision to withdraw from dialysis when the time comes ("When I [pt] say and please don't try to talk me out of it.")  2 hour uneventful session completed.  No fluid removed,  as per order. All blood was returned.  No changes from pre-HD assessment.   Chase Alexandria, RN

## 2020-11-24 NOTE — Progress Notes (Addendum)
Patient Demographics:    Chase [name], is a 55 y.o. male, DOB - 19-Dec-1965, PXT:[id number]  Admit date - 11/23/2020   Admitting Physician Bethena Roys, MD  Outpatient Primary MD for the patient is [name]  LOS - 1   Chief Complaint  Patient presents with   Failure To Thrive        Subjective:    Chase [name] today has no fevers, no emesis,  No chest pain,   -Patient's son [name] and [name] at bedside -Complains of generalized aches and pain  Assessment  & Plan :    Principal Problem:   Metabolic encephalopathy Active Problems:   Chronic pain   BPH (benign prostatic hyperplasia)   History of CVA (cerebrovascular accident)   DNR (do not resuscitate) discussion   ESRD (end stage renal disease) ([place])   Complicated UTI (urinary tract infection)   Malnutrition of moderate degree  Brief Summary:- 55 y.o. male with medical history significant for ESRD, CVA, Pseudomonas UTI, GI bleed, Hodgkin's lymphoma, chronic pain admitted on 11/22/2020 with acute metabolic encephalopathy in the setting of missed hemodialysis sessions  A/p 1)Acute metabolic encephalopathy--due to missed HD sessions -Discussed with nephrology--plan for serial/back-to-back HD sessions -Anticipate mentation will improve with hemodialysis sessions  2) social/ethics----discussed plan of care and advanced directives with patient, patient's son [name] from [place] and patient's son [name] from [place]--- -Patient request DNR/DNI status -Patient was family conference to further delineate goals of care  3) complicated CAUTI--previously grew Pseudomonas as well as E. Coli -Continue cefepime pending repeat culture data - 4)ESRD--missed about 2 weeks of HD sessions, -Discussed with nephrology--plan for serial/back-to-back HD sessions  5)BPH with LUTs----chronic indwelling Foley, changed 11/23/2020  6)H/o  CVA--aspirin and Lipitor for secondary stroke prophylaxis  7) chronic pain syndrome/PTSD/anxiety--- Cymbalta, gabapentin and Percocet as prescribed -As needed hydroxyzine  8) anemia of ESRD----H&H stable, ESA/Epogen per nephrology team  Disposition/Need for in-Hospital Stay- patient unable to be discharged at this time due to --- metabolic encephalopathy secondary to missed HD sessions requiring--serial/back-to-back HD sessions*  Status is: Inpatient  Remains inpatient appropriate because: Please see disposition above  Disposition: The patient is from: Home              Anticipated d/c is to: Home              Anticipated d/c date is: > 3 days              Patient currently is not medically stable to d/c. Barriers: Not Clinically Stable-   Code Status :  -  Code Status: DNR   Family Communication:    (patient is alert, awake and coherent)  -patient's son [name] from [place] and patient's son [name] from [place]  :  Nephrology/Palliative  DVT Prophylaxis  :   - SCDs  heparin injection 5,000 Units Start: 11/23/20 2200    Lab Results  Component Value Date   PLT 401 (H) 11/24/2020    Inpatient Medications  Scheduled Meds:  (feeding supplement) PROSource Plus  30 mL Oral TID BM   Chlorhexidine Gluconate Cloth  6 each Topical Daily   Chlorhexidine Gluconate Cloth  6 each Topical Q0600   heparin  5,000 Units Subcutaneous Q8H  midodrine  10 mg Oral Daily   pantoprazole  40 mg Oral Daily   sodium bicarbonate  650 mg Oral BID   Continuous Infusions:  sodium chloride     sodium chloride     ceFEPime (MAXIPIME) IV     PRN Meds:.sodium chloride, sodium chloride, acetaminophen **OR** acetaminophen, alteplase, heparin, oxyCODONE-acetaminophen, polyethylene glycol    Anti-infectives (From admission, onward)    Start     Dose/Rate Route Frequency Ordered Stop   11/24/20 2200  ceFEPIme (MAXIPIME) 1 g in sodium chloride 0.9 % 100 mL IVPB        1 g 200 mL/hr  over 30 Minutes Intravenous Every 24 hours 11/24/20 1034     11/24/20 1600  ceFEPIme (MAXIPIME) 2 g in sodium chloride 0.9 % 100 mL IVPB  Status:  Discontinued        2 g 200 mL/hr over 30 Minutes Intravenous Every M-W-F (Hemodialysis) 11/23/20 2026 11/24/20 1034   11/23/20 2030  ceFEPIme (MAXIPIME) 2 g in sodium chloride 0.9 % 100 mL IVPB        2 g 200 mL/hr over 30 Minutes Intravenous  Once 11/23/20 2022 11/23/20 2347   11/23/20 1815  cefTRIAXone (ROCEPHIN) 1 g in sodium chloride 0.9 % 100 mL IVPB        1 g 200 mL/hr over 30 Minutes Intravenous  Once 11/23/20 1814 11/23/20 1930         Objective:   Vitals:   11/24/20 1630 11/24/20 1640 11/24/20 1700 11/24/20 1730  BP: 103/68 104/69 110/68 120/69  Pulse: 64 71 68 67  Resp: 15     Temp: 98.2 F (36.8 C)     TempSrc: Oral     SpO2: 100%     Weight:      Height:        Wt Readings from Last 3 Encounters:  11/23/20 49.3 kg  10/24/20 63.6 kg  07/07/20 65.7 kg     Intake/Output Summary (Last 24 hours) at 11/24/2020 1835 Last data filed at 11/24/2020 1806 Gross per 24 hour  Intake 720 ml  Output 1850 ml  Net -1130 ml     Physical Exam  Gen:- Awake Alert, chronically ill-appearing HEENT:- [place].AT, No sclera icterus Neck-right IJ HD catheter Lungs-  CTAB , fair symmetrical air movement CV- S1, S2 normal, regular  Abd-  +ve B.Sounds, Abd Soft, No tenderness, ostomy bag with fecal materia Extremity/Skin:- No  edema, pedal pulses present  Psych-affect is appropriate, oriented x3 Neuro-generalized weakness, no new focal deficits, no tremors   Data Review:   Micro Results Recent Results (from the past 240 hour(s))  Blood culture (routine single)     Status: None (Preliminary result)   Collection Time: 11/23/20  3:54 PM   Specimen: BLOOD  Result Value Ref Range Status   Specimen Description BLOOD RIGHT ANTECUBITAL  Final   Special Requests   Final    BOTTLES DRAWN AEROBIC AND ANAEROBIC Blood Culture adequate volume    Culture   Final    NO GROWTH < 24 HOURS Performed at [facility], [address], [place], [place] [address]    Report Status PENDING  Incomplete  Resp Panel by RT-PCR (Flu A&B, Covid) Nasopharyngeal Swab     Status: None   Collection Time: 11/23/20  3:59 PM   Specimen: Nasopharyngeal Swab; Nasopharyngeal(NP) swabs in vial transport medium  Result Value Ref Range Status   SARS Coronavirus 2 by RT PCR NEGATIVE NEGATIVE Final    Comment: (  NOTE) SARS-CoV-2 target nucleic acids are NOT DETECTED.  The SARS-CoV-2 RNA is generally detectable in upper respiratory specimens during the acute phase of infection. The lowest concentration of SARS-CoV-2 viral copies this assay can detect is 138 copies/mL. A negative result does not preclude SARS-Cov-2 infection and should not be used as the sole basis for treatment or other patient management decisions. A negative result may occur with  improper specimen collection/handling, submission of specimen other than nasopharyngeal swab, presence of viral mutation(s) within the areas targeted by this assay, and inadequate number of viral copies(<138 copies/mL). A negative result must be combined with clinical observations, patient history, and epidemiological information. The expected result is Negative.  Fact Sheet for Patients:  EntrepreneurPulse.com.au  Fact Sheet for Healthcare Providers:  IncredibleEmployment.be  This test is no t yet approved or cleared by the [place] FDA and  has been authorized for detection and/or diagnosis of SARS-CoV-2 by FDA under an Emergency Use Authorization (EUA). This EUA will remain  in effect (meaning this test can be used) for the duration of the COVID-19 declaration under Section 564(b)(1) of the Act, 21 U.S.C.section 360bbb-3(b)(1), unless the authorization is terminated  or revoked sooner.       Influenza A by PCR NEGATIVE NEGATIVE Final   Influenza B by PCR  NEGATIVE NEGATIVE Final    Comment: (NOTE) The Xpert Xpress SARS-CoV-2/FLU/RSV plus assay is intended as an aid in the diagnosis of influenza from Nasopharyngeal swab specimens and should not be used as a sole basis for treatment. Nasal washings and aspirates are unacceptable for Xpert Xpress SARS-CoV-2/FLU/RSV testing.  Fact Sheet for Patients: EntrepreneurPulse.com.au  Fact Sheet for Healthcare Providers: IncredibleEmployment.be  This test is not yet approved or cleared by the [place] FDA and has been authorized for detection and/or diagnosis of SARS-CoV-2 by FDA under an Emergency Use Authorization (EUA). This EUA will remain in effect (meaning this test can be used) for the duration of the COVID-19 declaration under Section 564(b)(1) of the Act, 21 U.S.C. section 360bbb-3(b)(1), unless the authorization is terminated or revoked.  Performed at [facility], [address] [place], [place] [address]     Radiology Reports CT HEAD WO CONTRAST (5MM)  Result Date: 11/23/2020 CLINICAL DATA:  Delirium, hallucinations EXAM: CT HEAD WITHOUT CONTRAST TECHNIQUE: Contiguous axial images were obtained from the base of the skull through the vertex without intravenous contrast. COMPARISON:  06/27/2020 FINDINGS: Brain: No acute infarct or hemorrhage. Stable small chronic right occipital cortical infarct. Lateral ventricles and midline structures are unremarkable. There are no acute extra-axial fluid collections. There is no mass effect. Vascular: No hyperdense vessel or unexpected calcification. Skull: Normal. Negative for fracture or focal lesion. Sinuses/Orbits: No acute finding. Other: None. IMPRESSION: 1. Stable head CT.  No acute intracranial process. Electronically Signed   By: Randa Ngo M.D.   On: 11/23/2020 17:56   DG Chest Port 1 View  Result Date: 11/23/2020 CLINICAL DATA:  Weakness, no dialysis for 13 days EXAM: PORTABLE CHEST 1 VIEW  COMPARISON:  Chest radiograph 10/16/2020 FINDINGS: A right chest wall dialysis catheter is in stable position. The cardiomediastinal silhouette is stable. There is unchanged asymmetric elevation of the right hemidiaphragm. Aeration of the right base is improved compared to the prior study. There is no focal consolidation or pulmonary edema on the current study. There is no pleural effusion or pneumothorax. Right upper quadrant and right axillary surgical clips are noted. There is no acute osseous abnormality. IMPRESSION: Improved aeration of the right  base compared to 10/16/2020. No radiographic evidence of acute cardiopulmonary process currently. Electronically Signed   By: Valetta Mole M.D.   On: 11/23/2020 15:44     CBC Recent Labs  Lab 11/23/20 1554 11/24/20 0620  WBC 7.8 7.3  HGB 11.1* 10.6*  HCT 37.1* 34.7*  PLT 402* 401*  MCV 103.1* 103.0*  MCH 30.8 31.5  MCHC 29.9* 30.5  RDW 16.5* 16.4*  LYMPHSABS 0.4*  --   MONOABS 0.6  --   EOSABS 0.1  --   BASOSABS 0.0  --     Chemistries  Recent Labs  Lab 11/23/20 1554 11/24/20 0620  NA 136 140  K 5.5* 5.3*  CL 114* 113*  CO2 11* 13*  GLUCOSE 90 84  BUN 72* 77*  CREATININE 6.79* 6.90*  CALCIUM 8.7* 9.0  AST 13*  --   ALT 16  --   ALKPHOS 166*  --   BILITOT 0.8  --    ------------------------------------------------------------------------------------------------------------------ No results for input(s): CHOL, HDL, LDLCALC, TRIG, CHOLHDL, LDLDIRECT in the last 72 hours.  Lab Results  Component Value Date   HGBA1C 5.4 10/18/2019   ------------------------------------------------------------------------------------------------------------------ No results for input(s): TSH, T4TOTAL, T3FREE, THYROIDAB in the last 72 hours.  Invalid input(s): FREET3 ------------------------------------------------------------------------------------------------------------------ No results for input(s): VITAMINB12, FOLATE, FERRITIN, TIBC,  IRON, RETICCTPCT in the last 72 hours.  Coagulation profile Recent Labs  Lab 11/23/20 1554  INR 1.2    No results for input(s): DDIMER in the last 72 hours.  Cardiac Enzymes No results for input(s): CKMB, TROPONINI, MYOGLOBIN in the last 168 hours.  Invalid input(s): CK ------------------------------------------------------------------------------------------------------------------ No results found for: BNP   Roxan Hockey M.D on 11/24/2020 at 6:35 PM  Go to [email or web address] - for contact info  [facility] - Office  [phone]

## 2020-11-25 LAB — CBC
HCT: 30.6 % — ABNORMAL LOW (ref 39.0–52.0)
Hemoglobin: 9.7 g/dL — ABNORMAL LOW (ref 13.0–17.0)
MCH: 31.5 pg (ref 26.0–34.0)
MCHC: 31.7 g/dL (ref 30.0–36.0)
MCV: 99.4 fL (ref 80.0–100.0)
Platelets: 386 10*3/uL (ref 150–400)
RBC: 3.08 MIL/uL — ABNORMAL LOW (ref 4.22–5.81)
RDW: 16 % — ABNORMAL HIGH (ref 11.5–15.5)
WBC: 6 10*3/uL (ref 4.0–10.5)
nRBC: 0 % (ref 0.0–0.2)

## 2020-11-25 LAB — RENAL FUNCTION PANEL
Albumin: 2.8 g/dL — ABNORMAL LOW (ref 3.5–5.0)
Anion gap: 9 (ref 5–15)
BUN: 57 mg/dL — ABNORMAL HIGH (ref 6–20)
CO2: 22 mmol/L (ref 22–32)
Calcium: 8.2 mg/dL — ABNORMAL LOW (ref 8.9–10.3)
Chloride: 104 mmol/L (ref 98–111)
Creatinine, Ser: 4.77 mg/dL — ABNORMAL HIGH (ref 0.61–1.24)
GFR, Estimated: 13 mL/min — ABNORMAL LOW
Glucose, Bld: 101 mg/dL — ABNORMAL HIGH (ref 70–99)
Phosphorus: 3.8 mg/dL (ref 2.5–4.6)
Potassium: 3.1 mmol/L — ABNORMAL LOW (ref 3.5–5.1)
Sodium: 135 mmol/L (ref 135–145)

## 2020-11-25 LAB — MRSA NEXT GEN BY PCR, NASAL: MRSA by PCR Next Gen: NOT DETECTED

## 2020-11-25 MED ORDER — ALBUMIN HUMAN 25 % IV SOLN
INTRAVENOUS | Status: AC
  Administered 2020-11-25: 12.5 g via INTRAVENOUS
  Filled 2020-11-25: qty 50

## 2020-11-25 MED ORDER — ALBUMIN HUMAN 25 % IV SOLN: 12.5000 g | Freq: Once | INTRAVENOUS | Status: AC

## 2020-11-25 NOTE — Procedures (Signed)
   HEMODIALYSIS TREATMENT NOTE:  2.5 hour heparin-free HD completed via RIJ TDC.  Soft blood pressures; Albumin 12.5g was given once for asymptomatic hypotension.  No fluid was removed, as per nephrologist's order.  All blood was returned.   Pt's sons attended the session again and were able to meet with Dr. Joelyn Oms and Dr. Joesph Fillers at the bedside to discuss current status and goals of care.  Rockwell Alexandria, RN

## 2020-11-25 NOTE — Procedures (Signed)
I was present at this dialysis session. I have reviewed the session itself and made appropriate changes.   Seen on HD via [place]. Pt tolerating well. He is awake, cogent, and thoughtful.  Sons at bedside.  Discussed current status, GOC.  Plan to meet with palliative and SW on Monday.  [place] next Tx on 8/30.    Pt requ regular diet, should be ok.  Will order.   Filed Weights   11/23/20 1416 11/23/20 2156  Weight: 64 kg 49.3 kg    Recent Labs  Lab 11/25/20 0415  NA 135  K 3.1*  CL 104  CO2 22  GLUCOSE 101*  BUN 57*  CREATININE 4.77*  CALCIUM 8.2*  PHOS 3.8    Recent Labs  Lab 11/23/20 1554 11/24/20 0620 11/25/20 0415  WBC 7.8 7.3 6.0  NEUTROABS 6.8  --   --   HGB 11.1* 10.6* 9.7*  HCT 37.1* 34.7* 30.6*  MCV 103.1* 103.0* 99.4  PLT 402* 401* 386    Scheduled Meds:  (feeding supplement) PROSource Plus  30 mL Oral TID BM   aspirin EC  81 mg Oral Q breakfast   atorvastatin  10 mg Oral Daily   Chlorhexidine Gluconate Cloth  6 each Topical Daily   DULoxetine  30 mg Oral Daily   gabapentin  100 mg Oral BID   heparin  5,000 Units Subcutaneous Q8H   midodrine  10 mg Oral Daily   pantoprazole  40 mg Oral Daily   sodium bicarbonate  650 mg Oral BID   Continuous Infusions:  sodium chloride     sodium chloride     ceFEPime (MAXIPIME) IV 1 g (11/25/20 0038)   PRN Meds:.sodium chloride, sodium chloride, acetaminophen **OR** acetaminophen, alteplase, heparin, hydrOXYzine, oxyCODONE-acetaminophen, polyethylene glycol   Pearson Grippe  MD 11/25/2020, 1:24 PM

## 2020-11-25 NOTE — Progress Notes (Signed)
Patient Demographics:    Chase [name], is a 55 y.o. male, DOB - January 31, 1966, MNO:[id number]  Admit date - 11/23/2020   Admitting Physician Bethena Roys, MD  Outpatient Primary MD for the patient is [name]  LOS - 2   Chief Complaint  Patient presents with   Failure To Thrive        Subjective:    Jemario Poitras today has no fevers, no emesis,  No chest pain,   -Patient's son [name] and [name] at bedside, hemodialysis RN Levada Dy at bedside -Patient states that pain control is improving  Assessment  & Plan :    Principal Problem:   Metabolic encephalopathy Active Problems:   Chronic pain   BPH (benign prostatic hyperplasia)   History of CVA (cerebrovascular accident)   DNR (do not resuscitate) discussion   ESRD (end stage renal disease) ([place])   Complicated UTI (urinary tract infection)   Malnutrition of moderate degree  Brief Summary:- 55 y.o. male with medical history significant for ESRD, CVA, Pseudomonas UTI, GI bleed, Hodgkin's lymphoma, chronic pain admitted on 11/22/2020 with acute metabolic encephalopathy in the setting of missed hemodialysis sessions  A/p 1)Acute metabolic encephalopathy--due to missed HD sessions --- Patient had  serial/back-to-back HD sessions on 11/24/2020 on 11/25/2020 Mentation is improving with hemodialysis sessions  2) social/ethics----discussed plan of care and advanced directives with patient, patient's son [name] from [place] and patient's son [name] from [place]--- -Patient request DNR/DNI status -Patient was family conference to further delineate goals of care -Patient indicates that he is here to make a final decision as to whether he wants to continue hemodialysis or go with hospice -He will have further conversation with his kids prior to making a decision probably early next week  3) complicated CAUTI--previously grew  Pseudomonas as well as E. Coli -Repeat urine culture from 11/23/2020 with E. Coli--sensitivities pending -Continue cefepime pending further culture data - 4)ESRD--missed about 2 weeks of HD sessions, -Discussed with nephrology---- Patient had  serial/back-to-back HD sessions on 11/24/2020 on 11/25/2020  5)BPH with LUTs----chronic indwelling Foley, changed 11/23/2020  6)H/o CVA--mild residual left-sided hemiparesis  -- Continue aspirin and Lipitor for secondary stroke prophylaxis  7) chronic pain syndrome/PTSD/anxiety--- Cymbalta, gabapentin and Percocet as prescribed -As needed hydroxyzine  8) anemia of ESRD----H&H stable, ESA/Epogen per nephrology team  Disposition/Need for in-Hospital Stay- patient unable to be discharged at this time due to --- metabolic encephalopathy secondary to missed HD sessions requiring--serial/back-to-back HD sessions*  Status is: Inpatient  Remains inpatient appropriate because: Please see disposition above  Disposition: The patient is from: Home              Anticipated d/c is to: Home              Anticipated d/c date is: > 3 days              Patient currently is not medically stable to d/c. Barriers: Not Clinically Stable-   Code Status :  -  Code Status: DNR   Family Communication:    (patient is alert, awake and coherent)  -patient's son [name] from [place] and patient's son [name] from [place]  :  Nephrology/Palliative  DVT Prophylaxis  :   - SCDs  heparin injection 5,000 Units Start: 11/23/20 2200    Lab Results  Component Value Date   PLT 386 11/25/2020    Inpatient Medications  Scheduled Meds:  (feeding supplement) PROSource Plus  30 mL Oral TID BM   aspirin EC  81 mg Oral Q breakfast   atorvastatin  10 mg Oral Daily   Chlorhexidine Gluconate Cloth  6 each Topical Daily   DULoxetine  30 mg Oral Daily   gabapentin  100 mg Oral BID   heparin  5,000 Units Subcutaneous Q8H   midodrine  10 mg Oral Daily   pantoprazole   40 mg Oral Daily   sodium bicarbonate  650 mg Oral BID   Continuous Infusions:  ceFEPime (MAXIPIME) IV 1 g (11/25/20 0038)   PRN Meds:.acetaminophen **OR** acetaminophen, hydrOXYzine, oxyCODONE-acetaminophen, polyethylene glycol    Anti-infectives (From admission, onward)    Start     Dose/Rate Route Frequency Ordered Stop   11/24/20 2200  ceFEPIme (MAXIPIME) 1 g in sodium chloride 0.9 % 100 mL IVPB        1 g 200 mL/hr over 30 Minutes Intravenous Every 24 hours 11/24/20 1034     11/24/20 1600  ceFEPIme (MAXIPIME) 2 g in sodium chloride 0.9 % 100 mL IVPB  Status:  Discontinued        2 g 200 mL/hr over 30 Minutes Intravenous Every M-W-F (Hemodialysis) 11/23/20 2026 11/24/20 1034   11/23/20 2030  ceFEPIme (MAXIPIME) 2 g in sodium chloride 0.9 % 100 mL IVPB        2 g 200 mL/hr over 30 Minutes Intravenous  Once 11/23/20 2022 11/23/20 2347   11/23/20 1815  cefTRIAXone (ROCEPHIN) 1 g in sodium chloride 0.9 % 100 mL IVPB        1 g 200 mL/hr over 30 Minutes Intravenous  Once 11/23/20 1814 11/23/20 1930         Objective:   Vitals:   11/25/20 1330 11/25/20 1345 11/25/20 1400 11/25/20 1437  BP: 101/69 102/70 105/70 106/69  Pulse: 66 68 70 67  Resp:   14 16  Temp:   98 F (36.7 C) 97.9 F (36.6 C)  TempSrc:   Oral   SpO2:   99% 100%  Weight:      Height:        Wt Readings from Last 3 Encounters:  11/23/20 49.3 kg  10/24/20 63.6 kg  07/07/20 65.7 kg     Intake/Output Summary (Last 24 hours) at 11/25/2020 1544 Last data filed at 11/25/2020 1345 Gross per 24 hour  Intake 580 ml  Output 600 ml  Net -20 ml    Physical Exam  Gen:- Awake Alert, chronically ill-appearing HEENT:- [place].AT, No sclera icterus Neck-right IJ HD catheter Lungs-  CTAB , fair symmetrical air movement CV- S1, S2 normal, regular  Abd-  +ve B.Sounds, Abd Soft, No tenderness, ostomy bag with fecal materia Extremity/Skin:- No  edema, pedal pulses present  Psych-affect is appropriate, oriented  x3 Neuro-generalized weakness, no new focal deficits, no tremors GU--Foley with urine   Data Review:   Micro Results Recent Results (from the past 240 hour(s))  Blood culture (routine single)     Status: None (Preliminary result)   Collection Time: 11/23/20  3:54 PM   Specimen: BLOOD  Result Value Ref Range Status   Specimen Description BLOOD RIGHT ANTECUBITAL  Final   Special Requests   Final    BOTTLES DRAWN AEROBIC AND ANAEROBIC Blood Culture adequate volume   Culture  Final    NO GROWTH 2 DAYS Performed at [facility], [address], [place], [place] [address]    Report Status PENDING  Incomplete  Resp Panel by RT-PCR (Flu A&B, Covid) Nasopharyngeal Swab     Status: None   Collection Time: 11/23/20  3:59 PM   Specimen: Nasopharyngeal Swab; Nasopharyngeal(NP) swabs in vial transport medium  Result Value Ref Range Status   SARS Coronavirus 2 by RT PCR NEGATIVE NEGATIVE Final    Comment: (NOTE) SARS-CoV-2 target nucleic acids are NOT DETECTED.  The SARS-CoV-2 RNA is generally detectable in upper respiratory specimens during the acute phase of infection. The lowest concentration of SARS-CoV-2 viral copies this assay can detect is 138 copies/mL. A negative result does not preclude SARS-Cov-2 infection and should not be used as the sole basis for treatment or other patient management decisions. A negative result may occur with  improper specimen collection/handling, submission of specimen other than nasopharyngeal swab, presence of viral mutation(s) within the areas targeted by this assay, and inadequate number of viral copies(<138 copies/mL). A negative result must be combined with clinical observations, patient history, and epidemiological information. The expected result is Negative.  Fact Sheet for Patients:  EntrepreneurPulse.com.au  Fact Sheet for Healthcare Providers:  IncredibleEmployment.be  This test is no t yet approved or  cleared by the [place] FDA and  has been authorized for detection and/or diagnosis of SARS-CoV-2 by FDA under an Emergency Use Authorization (EUA). This EUA will remain  in effect (meaning this test can be used) for the duration of the COVID-19 declaration under Section 564(b)(1) of the Act, 21 U.S.C.section 360bbb-3(b)(1), unless the authorization is terminated  or revoked sooner.       Influenza A by PCR NEGATIVE NEGATIVE Final   Influenza B by PCR NEGATIVE NEGATIVE Final    Comment: (NOTE) The Xpert Xpress SARS-CoV-2/FLU/RSV plus assay is intended as an aid in the diagnosis of influenza from Nasopharyngeal swab specimens and should not be used as a sole basis for treatment. Nasal washings and aspirates are unacceptable for Xpert Xpress SARS-CoV-2/FLU/RSV testing.  Fact Sheet for Patients: EntrepreneurPulse.com.au  Fact Sheet for Healthcare Providers: IncredibleEmployment.be  This test is not yet approved or cleared by the [place] FDA and has been authorized for detection and/or diagnosis of SARS-CoV-2 by FDA under an Emergency Use Authorization (EUA). This EUA will remain in effect (meaning this test can be used) for the duration of the COVID-19 declaration under Section 564(b)(1) of the Act, 21 U.S.C. section 360bbb-3(b)(1), unless the authorization is terminated or revoked.  Performed at [facility], [address] [place], [place] [address]   Urine Culture     Status: Abnormal (Preliminary result)   Collection Time: 11/23/20  7:56 PM   Specimen: Urine, Catheterized  Result Value Ref Range Status   Specimen Description   Final    URINE, CATHETERIZED Performed at [facility], [address], [place], [place] [address]    Special Requests   Final    NONE Performed at [facility], [address] [place], [place] [address]    Culture (A)  Final    >=100,000 COLONIES/mL ESCHERICHIA COLI SUSCEPTIBILITIES TO  FOLLOW Performed at [facility] [address], [place], [place] [address]    Report Status PENDING  Incomplete  MRSA Next Gen by PCR, Nasal     Status: None   Collection Time: 11/25/20  5:47 AM   Specimen: Nasal Mucosa; Nasal Swab  Result Value Ref Range Status  MRSA by PCR Next Gen NOT DETECTED NOT DETECTED Final    Comment: (NOTE) The GeneXpert MRSA Assay (FDA approved for NASAL specimens only), is one component of a comprehensive MRSA colonization surveillance program. It is not intended to diagnose MRSA infection nor to guide or monitor treatment for MRSA infections. Test performance is not FDA approved in patients less than 67 years old. Performed at [facility], [address] [place], [place] [address]     Radiology Reports CT HEAD WO CONTRAST (5MM)  Result Date: 11/23/2020 CLINICAL DATA:  Delirium, hallucinations EXAM: CT HEAD WITHOUT CONTRAST TECHNIQUE: Contiguous axial images were obtained from the base of the skull through the vertex without intravenous contrast. COMPARISON:  06/27/2020 FINDINGS: Brain: No acute infarct or hemorrhage. Stable small chronic right occipital cortical infarct. Lateral ventricles and midline structures are unremarkable. There are no acute extra-axial fluid collections. There is no mass effect. Vascular: No hyperdense vessel or unexpected calcification. Skull: Normal. Negative for fracture or focal lesion. Sinuses/Orbits: No acute finding. Other: None. IMPRESSION: 1. Stable head CT.  No acute intracranial process. Electronically Signed   By: Randa Ngo M.D.   On: 11/23/2020 17:56   DG Chest Port 1 View  Result Date: 11/23/2020 CLINICAL DATA:  Weakness, no dialysis for 13 days EXAM: PORTABLE CHEST 1 VIEW COMPARISON:  Chest radiograph 10/16/2020 FINDINGS: A right chest wall dialysis catheter is in stable position. The cardiomediastinal silhouette is stable. There is unchanged asymmetric elevation of the right hemidiaphragm. Aeration of the  right base is improved compared to the prior study. There is no focal consolidation or pulmonary edema on the current study. There is no pleural effusion or pneumothorax. Right upper quadrant and right axillary surgical clips are noted. There is no acute osseous abnormality. IMPRESSION: Improved aeration of the right base compared to 10/16/2020. No radiographic evidence of acute cardiopulmonary process currently. Electronically Signed   By: Valetta Mole M.D.   On: 11/23/2020 15:44     CBC Recent Labs  Lab 11/23/20 1554 11/24/20 0620 11/25/20 0415  WBC 7.8 7.3 6.0  HGB 11.1* 10.6* 9.7*  HCT 37.1* 34.7* 30.6*  PLT 402* 401* 386  MCV 103.1* 103.0* 99.4  MCH 30.8 31.5 31.5  MCHC 29.9* 30.5 31.7  RDW 16.5* 16.4* 16.0*  LYMPHSABS 0.4*  --   --   MONOABS 0.6  --   --   EOSABS 0.1  --   --   BASOSABS 0.0  --   --     Chemistries  Recent Labs  Lab 11/23/20 1554 11/24/20 0620 11/25/20 0415  NA 136 140 135  K 5.5* 5.3* 3.1*  CL 114* 113* 104  CO2 11* 13* 22  GLUCOSE 90 84 101*  BUN 72* 77* 57*  CREATININE 6.79* 6.90* 4.77*  CALCIUM 8.7* 9.0 8.2*  AST 13*  --   --   ALT 16  --   --   ALKPHOS 166*  --   --   BILITOT 0.8  --   --    ------------------------------------------------------------------------------------------------------------------ No results for input(s): CHOL, HDL, LDLCALC, TRIG, CHOLHDL, LDLDIRECT in the last 72 hours.  Lab Results  Component Value Date   HGBA1C 5.4 10/18/2019   ------------------------------------------------------------------------------------------------------------------ No results for input(s): TSH, T4TOTAL, T3FREE, THYROIDAB in the last 72 hours.  Invalid input(s): FREET3 ------------------------------------------------------------------------------------------------------------------ No results for input(s): VITAMINB12, FOLATE, FERRITIN, TIBC, IRON, RETICCTPCT in the last 72 hours.  Coagulation profile Recent Labs  Lab 11/23/20 1554   INR 1.2    No  results for input(s): DDIMER in the last 72 hours.  Cardiac Enzymes No results for input(s): CKMB, TROPONINI, MYOGLOBIN in the last 168 hours.  Invalid input(s): CK ------------------------------------------------------------------------------------------------------------------ No results found for: BNP  Roxan Hockey M.D on 11/25/2020 at 3:44 PM  Go to [email or web address] - for contact info  [facility] - Office  [phone]

## 2020-11-26 LAB — URINE CULTURE: Culture: >=100000 — AB

## 2020-11-26 MED ORDER — MIDODRINE HCL 5 MG PO TABS
10.0000 mg | ORAL_TABLET | Freq: Three times a day (TID) | ORAL | Status: DC
  Administered 2020-11-26 – 2020-12-01 (×15): 10 mg via ORAL
  Filled 2020-11-26 (×15): qty 2

## 2020-11-26 MED ORDER — CEPHALEXIN 250 MG PO CAPS
250.0000 mg | ORAL_CAPSULE | Freq: Two times a day (BID) | ORAL | Status: DC
  Administered 2020-11-26 – 2020-12-01 (×10): 250 mg via ORAL
  Filled 2020-11-26 (×10): qty 1

## 2020-11-26 NOTE — Progress Notes (Signed)
Patient Demographics:    Chase [name], is a 55 y.o. male, DOB - 07-Sep-1965, UTM:[id number]  Admit date - 11/23/2020   Admitting Physician Bethena Roys, MD  Outpatient Primary MD for the patient is [name]  LOS - 3   Chief Complaint  Patient presents with   Failure To Thrive        Subjective:    [name] today has no fevers, no emesis,  No chest pain,   No new complaints  Pain control is better  Assessment  & Plan :    Principal Problem:   Metabolic encephalopathy Active Problems:   Chronic pain   BPH (benign prostatic hyperplasia)   History of CVA (cerebrovascular accident)   DNR (do not resuscitate) discussion   ESRD (end stage renal disease) ([place])   Complicated UTI (urinary tract infection)   Malnutrition of moderate degree  Brief Summary:- 55 y.o. male with medical history significant for ESRD, CVA, Pseudomonas UTI, GI bleed, Hodgkin's lymphoma, chronic pain admitted on 11/22/2020 with acute metabolic encephalopathy in the setting of missed hemodialysis sessions (did Not have HD for 2 wks PTA)  A/p 1)Acute metabolic encephalopathy--due to missed HD sessions --- Patient had  serial/back-to-back HD sessions on 11/24/2020 on 11/25/2020 [place] appears back to baseline after back-to-back hemodialysis sessions  2) social/ethics----discussed plan of care and advanced directives with patient, patient's son Chase [name] from [place] and patient's son Chase [name] from [place]--- -Patient request DNR/DNI status -Patient wants family conference to further delineate goals of care -Patient indicates that he is here to make a final decision as to whether he wants to continue hemodialysis or go with hospice -He will have further conversation with his kids prior to making a decision probably early next week  3) complicated CAUTI--previously grew Pseudomonas as well as E.  Coli -Repeat urine culture from 11/23/2020 with pansensitive E. Coli--s okay to stop cefepime and de-escalated to Keflex - 4)ESRD--missed about 2 weeks of HD sessions, -Discussed with nephrology---- Patient had  serial/back-to-back HD sessions on 11/24/2020 on 11/25/2020 -Further HD per nephrology team  5)BPH with LUTs----chronic indwelling Foley, changed 11/23/2020  6)H/o CVA--mild residual left-sided hemiparesis  -- Continue aspirin and Lipitor for secondary stroke prophylaxis  7) chronic pain syndrome/PTSD/anxiety--- Cymbalta, gabapentin and Percocet as prescribed -As needed hydroxyzine  8) anemia of ESRD----H&H stable, ESA/Epogen per nephrology team  Disposition/Need for in-Hospital Stay- patient unable to be discharged at this time due to --- metabolic encephalopathy secondary to missed HD sessions requiring--serial/back-to-back HD sessions*  Status is: Inpatient  Remains inpatient appropriate because: Please see disposition above  Disposition: The patient is from: Home              Anticipated d/c is to: Home              Anticipated d/c date is: 2 days              Patient currently is not medically stable to d/c. Barriers: Not Clinically Stable-   Code Status :  -  Code Status: DNR   Family Communication:    (patient is alert, awake and coherent)  -patient's son Chase [name] from [place] and patient's son Chase [name] from [place]  :  Nephrology/Palliative  DVT Prophylaxis  :   -  SCDs  heparin injection 5,000 Units Start: 11/23/20 2200    Lab Results  Component Value Date   PLT 386 11/25/2020    Inpatient Medications  Scheduled Meds:  (feeding supplement) PROSource Plus  30 mL Oral TID BM   aspirin EC  81 mg Oral Q breakfast   atorvastatin  10 mg Oral Daily   cephALEXin  250 mg Oral Q12H   Chlorhexidine Gluconate Cloth  6 each Topical Daily   DULoxetine  30 mg Oral Daily   gabapentin  100 mg Oral BID   heparin  5,000 Units Subcutaneous Q8H   midodrine  10  mg Oral TID   pantoprazole  40 mg Oral Daily   sodium bicarbonate  650 mg Oral BID   Continuous Infusions:   PRN Meds:.acetaminophen **OR** acetaminophen, hydrOXYzine, oxyCODONE-acetaminophen, polyethylene glycol    Anti-infectives (From admission, onward)    Start     Dose/Rate Route Frequency Ordered Stop   11/26/20 2200  cephALEXin (KEFLEX) capsule 250 mg        250 mg Oral Every 12 hours 11/26/20 1902     11/24/20 2200  ceFEPIme (MAXIPIME) 1 g in sodium chloride 0.9 % 100 mL IVPB  Status:  Discontinued        1 g 200 mL/hr over 30 Minutes Intravenous Every 24 hours 11/24/20 1034 11/26/20 1902   11/24/20 1600  ceFEPIme (MAXIPIME) 2 g in sodium chloride 0.9 % 100 mL IVPB  Status:  Discontinued        2 g 200 mL/hr over 30 Minutes Intravenous Every M-W-F (Hemodialysis) 11/23/20 2026 11/24/20 1034   11/23/20 2030  ceFEPIme (MAXIPIME) 2 g in sodium chloride 0.9 % 100 mL IVPB        2 g 200 mL/hr over 30 Minutes Intravenous  Once 11/23/20 2022 11/23/20 2347   11/23/20 1815  cefTRIAXone (ROCEPHIN) 1 g in sodium chloride 0.9 % 100 mL IVPB        1 g 200 mL/hr over 30 Minutes Intravenous  Once 11/23/20 1814 11/23/20 1930         Objective:   Vitals:   11/25/20 2021 11/26/20 0528 11/26/20 0820 11/26/20 1412  BP: 104/71 (!) 90/53 (!) 87/57 120/76  Pulse: 66 70 68 74  Resp: 18 18  17   Temp: 98.9 F (37.2 C) 99.1 F (37.3 C)  98.9 F (37.2 C)  TempSrc: Oral Oral    SpO2:  99%  98%  Weight:      Height:        Wt Readings from Last 3 Encounters:  11/23/20 49.3 kg  10/24/20 63.6 kg  07/07/20 65.7 kg     Intake/Output Summary (Last 24 hours) at 11/26/2020 1903 Last data filed at 11/26/2020 1755 Gross per 24 hour  Intake 840 ml  Output 1000 ml  Net -160 ml    Physical Exam  Gen:- Awake Alert, chronically ill-and cachectic appearing HEENT:- [place].AT, No sclera icterus Neck-right IJ HD catheter Lungs-  CTAB , fair symmetrical air movement CV- S1, S2 normal, regular   Abd-  +ve B.Sounds, Abd Soft, No tenderness, ostomy bag with fecal materia Extremity/Skin:- No  edema, pedal pulses present  Psych-affect is appropriate, oriented x3 Neuro-generalized weakness, no new focal deficits, no tremors GU--Foley with urine   Data Review:   Micro Results Recent Results (from the past 240 hour(s))  Blood culture (routine single)     Status: None (Preliminary result)   Collection Time: 11/23/20  3:54 PM   Specimen:  BLOOD  Result Value Ref Range Status   Specimen Description BLOOD RIGHT ANTECUBITAL  Final   Special Requests   Final    BOTTLES DRAWN AEROBIC AND ANAEROBIC Blood Culture adequate volume   Culture   Final    NO GROWTH 3 DAYS Performed at [facility], [address], [place], [place] [address]    Report Status PENDING  Incomplete  Resp Panel by RT-PCR (Flu A&B, Covid) Nasopharyngeal Swab     Status: None   Collection Time: 11/23/20  3:59 PM   Specimen: Nasopharyngeal Swab; Nasopharyngeal(NP) swabs in vial transport medium  Result Value Ref Range Status   SARS Coronavirus 2 by RT PCR NEGATIVE NEGATIVE Final    Comment: (NOTE) SARS-CoV-2 target nucleic acids are NOT DETECTED.  The SARS-CoV-2 RNA is generally detectable in upper respiratory specimens during the acute phase of infection. The lowest concentration of SARS-CoV-2 viral copies this assay can detect is 138 copies/mL. A negative result does not preclude SARS-Cov-2 infection and should not be used as the sole basis for treatment or other patient management decisions. A negative result may occur with  improper specimen collection/handling, submission of specimen other than nasopharyngeal swab, presence of viral mutation(s) within the areas targeted by this assay, and inadequate number of viral copies(<138 copies/mL). A negative result must be combined with clinical observations, patient history, and epidemiological information. The expected result is Negative.  Fact Sheet for Patients:   EntrepreneurPulse.com.au  Fact Sheet for Healthcare Providers:  IncredibleEmployment.be  This test is no t yet approved or cleared by the [place] FDA and  has been authorized for detection and/or diagnosis of SARS-CoV-2 by FDA under an Emergency Use Authorization (EUA). This EUA will remain  in effect (meaning this test can be used) for the duration of the COVID-19 declaration under Section 564(b)(1) of the Act, 21 U.S.C.section 360bbb-3(b)(1), unless the authorization is terminated  or revoked sooner.       Influenza A by PCR NEGATIVE NEGATIVE Final   Influenza B by PCR NEGATIVE NEGATIVE Final    Comment: (NOTE) The Xpert Xpress SARS-CoV-2/FLU/RSV plus assay is intended as an aid in the diagnosis of influenza from Nasopharyngeal swab specimens and should not be used as a sole basis for treatment. Nasal washings and aspirates are unacceptable for Xpert Xpress SARS-CoV-2/FLU/RSV testing.  Fact Sheet for Patients: EntrepreneurPulse.com.au  Fact Sheet for Healthcare Providers: IncredibleEmployment.be  This test is not yet approved or cleared by the [place] FDA and has been authorized for detection and/or diagnosis of SARS-CoV-2 by FDA under an Emergency Use Authorization (EUA). This EUA will remain in effect (meaning this test can be used) for the duration of the COVID-19 declaration under Section 564(b)(1) of the Act, 21 U.S.C. section 360bbb-3(b)(1), unless the authorization is terminated or revoked.  Performed at [facility], [address], [place], [place] [address]   Urine Culture     Status: Abnormal   Collection Time: 11/23/20  7:56 PM   Specimen: Urine, Catheterized  Result Value Ref Range Status   Specimen Description   Final    URINE, CATHETERIZED Performed at [facility], [address] [place], [place] [address]    Special Requests   Final    NONE Performed at [facility], [address] [place], [place] [address]    Culture >=100,000 COLONIES/mL ESCHERICHIA COLI (A)  Final   Report Status 11/26/2020 FINAL  Final   Organism ID, Bacteria ESCHERICHIA COLI (A)  Final  Susceptibility   Escherichia coli - MIC*    AMPICILLIN 4 SENSITIVE Sensitive     CEFAZOLIN <=4 SENSITIVE Sensitive     CEFEPIME <=0.12 SENSITIVE Sensitive     CEFTRIAXONE <=0.25 SENSITIVE Sensitive     CIPROFLOXACIN <=0.25 SENSITIVE Sensitive     GENTAMICIN <=1 SENSITIVE Sensitive     IMIPENEM <=0.25 SENSITIVE Sensitive     NITROFURANTOIN <=16 SENSITIVE Sensitive     TRIMETH/SULFA <=20 SENSITIVE Sensitive     AMPICILLIN/SULBACTAM <=2 SENSITIVE Sensitive     PIP/TAZO <=4 SENSITIVE Sensitive     * >=100,000 COLONIES/mL ESCHERICHIA COLI  MRSA Next Gen by PCR, Nasal     Status: None   Collection Time: 11/25/20  5:47 AM   Specimen: Nasal Mucosa; Nasal Swab  Result Value Ref Range Status   MRSA by PCR Next Gen NOT DETECTED NOT DETECTED Final    Comment: (NOTE) The GeneXpert MRSA Assay (FDA approved for NASAL specimens only), is one component of a comprehensive MRSA colonization surveillance program. It is not intended to diagnose MRSA infection nor to guide or monitor treatment for MRSA infections. Test performance is not FDA approved in patients less than 3 years old. Performed at [facility], [address], [place], [place] [address]     Radiology Reports CT HEAD WO CONTRAST (5MM)  Result Date: 11/23/2020 CLINICAL DATA:  Delirium, hallucinations EXAM: CT HEAD WITHOUT CONTRAST TECHNIQUE: Contiguous axial images were obtained from the base of the skull through the vertex without intravenous contrast. COMPARISON:  06/27/2020 FINDINGS: Brain: No acute infarct or hemorrhage. Stable small chronic right occipital cortical infarct. Lateral ventricles and midline structures are unremarkable. There are no acute extra-axial fluid collections. There is no mass effect. Vascular: No hyperdense  vessel or unexpected calcification. Skull: Normal. Negative for fracture or focal lesion. Sinuses/Orbits: No acute finding. Other: None. IMPRESSION: 1. Stable head CT.  No acute intracranial process. Electronically Signed   By: Randa Ngo M.D.   On: 11/23/2020 17:56   DG Chest Port 1 View  Result Date: 11/23/2020 CLINICAL DATA:  Weakness, no dialysis for 13 days EXAM: PORTABLE CHEST 1 VIEW COMPARISON:  Chest radiograph 10/16/2020 FINDINGS: A right chest wall dialysis catheter is in stable position. The cardiomediastinal silhouette is stable. There is unchanged asymmetric elevation of the right hemidiaphragm. Aeration of the right base is improved compared to the prior study. There is no focal consolidation or pulmonary edema on the current study. There is no pleural effusion or pneumothorax. Right upper quadrant and right axillary surgical clips are noted. There is no acute osseous abnormality. IMPRESSION: Improved aeration of the right base compared to 10/16/2020. No radiographic evidence of acute cardiopulmonary process currently. Electronically Signed   By: Valetta Mole M.D.   On: 11/23/2020 15:44     CBC Recent Labs  Lab 11/23/20 1554 11/24/20 0620 11/25/20 0415  WBC 7.8 7.3 6.0  HGB 11.1* 10.6* 9.7*  HCT 37.1* 34.7* 30.6*  PLT 402* 401* 386  MCV 103.1* 103.0* 99.4  MCH 30.8 31.5 31.5  MCHC 29.9* 30.5 31.7  RDW 16.5* 16.4* 16.0*  LYMPHSABS 0.4*  --   --   MONOABS 0.6  --   --   EOSABS 0.1  --   --   BASOSABS 0.0  --   --     Chemistries  Recent Labs  Lab 11/23/20 1554 11/24/20 0620 11/25/20 0415  NA 136 140 135  K 5.5* 5.3* 3.1*  CL 114* 113* 104  CO2 11* 13* 22  GLUCOSE 90 84 101*  BUN 72* 77* 57*  CREATININE 6.79* 6.90* 4.77*  CALCIUM 8.7* 9.0 8.2*  AST 13*  --   --   ALT 16  --   --   ALKPHOS 166*  --   --   BILITOT 0.8  --   --    ------------------------------------------------------------------------------------------------------------------ No results for  input(s): CHOL, HDL, LDLCALC, TRIG, CHOLHDL, LDLDIRECT in the last 72 hours.  Lab Results  Component Value Date   HGBA1C 5.4 10/18/2019   ------------------------------------------------------------------------------------------------------------------ No results for input(s): TSH, T4TOTAL, T3FREE, THYROIDAB in the last 72 hours.  Invalid input(s): FREET3 ------------------------------------------------------------------------------------------------------------------ No results for input(s): VITAMINB12, FOLATE, FERRITIN, TIBC, IRON, RETICCTPCT in the last 72 hours.  Coagulation profile Recent Labs  Lab 11/23/20 1554  INR 1.2    No results for input(s): DDIMER in the last 72 hours.  Cardiac Enzymes No results for input(s): CKMB, TROPONINI, MYOGLOBIN in the last 168 hours.  Invalid input(s): CK ------------------------------------------------------------------------------------------------------------------ No results found for: BNP  Roxan Hockey M.D on 11/26/2020 at 7:03 PM  Go to [email or web address] - for contact info  [facility] - Office  [phone]

## 2020-11-26 NOTE — Progress Notes (Signed)
Changed patients colostomy placed one piece system. Patient tolerated well.

## 2020-11-26 NOTE — Progress Notes (Signed)
Pharmacy Antibiotic Note  Chase [name] is a 55 y.o. male admitted on 11/23/2020 with UTI.  Pharmacy has been consulted for cefepime dosing.  Plan: Cefepime 2000 mg IV every MWF w/ HD Monitor labs, c/s, and patient improvement.  Height: 5\' 8"  (172.7 cm) Weight: 49.3 kg (108 lb 11 oz) IBW/kg (Calculated) : 68.4  Temp (24hrs), Avg:98.5 F (36.9 C), Min:97.9 F (36.6 C), Max:99.1 F (37.3 C)  Recent Labs  Lab 11/23/20 1554 11/23/20 1753 11/24/20 0620 11/25/20 0415  WBC 7.8  --  7.3 6.0  CREATININE 6.79*  --  6.90* 4.77*  LATICACIDVEN 0.6 0.7  --   --      Estimated Creatinine Clearance: 11.9 mL/min (A) (by C-G formula based on SCr of 4.77 mg/dL (H)).    Allergies  Allergen Reactions   Nsaids Other (See Comments)    Bleeding GI Ulceration   Ciprofloxacin Nausea And Vomiting    Antimicrobials this admission: Cefepime 8/25 >>  Microbiology results: 8/25 BCx: NGTD 8/25 UCx: >=100,000 COLONIES/mL ESCHERICHIA COLI  11/10/2019 Ucx: pseudomonas and e. coli   Thank you for allowing pharmacy to be a part of this patient's care.  Donna Christen Damareon Lanni 11/26/2020 12:02 PM

## 2020-11-27 ENCOUNTER — Encounter (HOSPITAL_COMMUNITY): Payer: Self-pay | Admitting: Internal Medicine

## 2020-11-27 DIAGNOSIS — Z515 Encounter for palliative care: Secondary | ICD-10-CM

## 2020-11-27 DIAGNOSIS — Z7189 Other specified counseling: Secondary | ICD-10-CM

## 2020-11-27 LAB — RENAL FUNCTION PANEL
Albumin: 2.6 g/dL — ABNORMAL LOW (ref 3.5–5.0)
Anion gap: 10 (ref 5–15)
BUN: 43 mg/dL — ABNORMAL HIGH (ref 6–20)
CO2: 26 mmol/L (ref 22–32)
Calcium: 8.2 mg/dL — ABNORMAL LOW (ref 8.9–10.3)
Chloride: 101 mmol/L (ref 98–111)
Creatinine, Ser: 4.58 mg/dL — ABNORMAL HIGH (ref 0.61–1.24)
GFR, Estimated: 14 mL/min — ABNORMAL LOW (ref >60)
Glucose, Bld: 147 mg/dL — ABNORMAL HIGH (ref 70–99)
Phosphorus: 4.3 mg/dL (ref 2.5–4.6)
Potassium: 2.7 mmol/L — CL (ref 3.5–5.1)
Sodium: 137 mmol/L (ref 135–145)

## 2020-11-27 LAB — CBC
HCT: 31.1 % — ABNORMAL LOW (ref 39.0–52.0)
Hemoglobin: 9.6 g/dL — ABNORMAL LOW (ref 13.0–17.0)
MCH: 30.7 pg (ref 26.0–34.0)
MCHC: 30.9 g/dL (ref 30.0–36.0)
MCV: 99.4 fL (ref 80.0–100.0)
Platelets: 347 10*3/uL (ref 150–400)
RBC: 3.13 MIL/uL — ABNORMAL LOW (ref 4.22–5.81)
RDW: 15.9 % — ABNORMAL HIGH (ref 11.5–15.5)
WBC: 6.8 10*3/uL (ref 4.0–10.5)
nRBC: 0 % (ref 0.0–0.2)

## 2020-11-27 LAB — IRON AND TIBC
Iron: 42 ug/dL — ABNORMAL LOW (ref 45–182)
Saturation Ratios: 19 % (ref 17.9–39.5)
TIBC: 217 ug/dL — ABNORMAL LOW (ref 250–450)
UIBC: 175 ug/dL

## 2020-11-27 LAB — FERRITIN: Ferritin: 382 ng/mL — ABNORMAL HIGH (ref 24–336)

## 2020-11-27 MED ORDER — DARBEPOETIN ALFA 60 MCG/0.3ML IJ SOSY
60.0000 ug | PREFILLED_SYRINGE | INTRAMUSCULAR | Status: DC
  Filled 2020-11-27: qty 0.3

## 2020-11-27 MED ORDER — PRAZOSIN HCL 1 MG PO CAPS
1.0000 mg | ORAL_CAPSULE | Freq: Every day | ORAL | Status: DC
  Administered 2020-11-27 – 2020-11-30 (×4): 1 mg via ORAL
  Filled 2020-11-27 (×7): qty 1

## 2020-11-27 MED ORDER — TAMSULOSIN HCL 0.4 MG PO CAPS: 0.4000 mg | ORAL_CAPSULE | Freq: Every day | ORAL | Status: DC

## 2020-11-27 MED ORDER — POTASSIUM CHLORIDE CRYS ER 20 MEQ PO TBCR
40.0000 meq | EXTENDED_RELEASE_TABLET | ORAL | Status: AC
  Administered 2020-11-27 (×2): 40 meq via ORAL
  Filled 2020-11-27 (×2): qty 2

## 2020-11-27 MED ORDER — CALCIUM CARBONATE ANTACID 500 MG PO CHEW
1.0000 | CHEWABLE_TABLET | Freq: Three times a day (TID) | ORAL | Status: DC
  Administered 2020-11-27 – 2020-12-01 (×11): 200 mg via ORAL
  Filled 2020-11-27 (×10): qty 1

## 2020-11-27 MED ORDER — PRAZOSIN HCL 1 MG PO CAPS
ORAL_CAPSULE | ORAL | Status: AC
  Filled 2020-11-27: qty 1

## 2020-11-27 NOTE — Consult Note (Signed)
Consultation Note Date: 11/27/2020   Patient Name: OLLIVANDER SEE  DOB: 1966/01/03  MRN: [id number]  Age / Sex: 55 y.o., male  PCP: [name] Referring Physician: Roxan Hockey, MD  Reason for Consultation: Establishing goals of care  HPI/Patient Profile: 55 y.o. male  with past medical history of ESRD on HD, CVA, Pseudomonas UTI, GI bleed, Hodgkin's lymphoma, chronic pain, laparoscopic sigmoid colectomy with colostomy January 2022, cervical radiculopathy, history of gastric bypass, history of stomach ulcers, PTSD, urinary retention due to bladder outlet obstruction, left arm AV fistula placed July 2022, multiple IR placed BL nephrostomy tubes, ureteral stent placement, admitted on 9/47/0962 with metabolic encephalopathy after missing multiple hemodialysis sessions.   Clinical Assessment and Goals of Care: I have reviewed medical records including EPIC notes, labs and imaging, received report from RN, assessed the patient.  Mr. Argabright is well-known to the palliative medicine team.  He has been seen multiple times.  He appears acutely/chronically ill and somewhat frail.  He greets me making and somewhat keeping eye contact.  He is alert and oriented, able to make his basic needs known.  There is no family at bedside at this time.    I introduced Palliative Medicine as specialized medical care for people living with serious illness. It focuses on providing relief from the symptoms and stress of a serious illness. The goal is to improve quality of life for both the patient and the family.   We discuss diagnosis prognosis, GOC, EOL wishes, disposition and options.  Mr. Lightsey tells me that he will continue to take dialysis.  He shares that he understands he is scheduled for dialysis tomorrow, 8/29.  He tells me that he has had multiple discussions with both the attending and his sons.  He shares that he  was washed out from dialysis.  Mr. Isenberg states that he is considering ALF/VA home if offered a bed.  He tells me that he is working closely with his [place] Education officer, museum, Theatre manager.  I attempted to elicit values and goals of care important to the patient.  Mr. Berhane shares that he would like to have something in his life to "look forward to".  He shares that it is difficult to go to dialysis because he feels washed out afterwards.  The difference between aggressive medical intervention and comfort care was considered in light of the patient's goals of care.   At this point, Mr. Duffey would like to continue hemodialysis, sharing that he needs more time to talk with his sons.  He continues to decline outpatient palliative services.  Discussed the importance of continued conversation with ptient and the medical providers regarding overall plan of care and treatment options, ensuring decisions are within the context of the patient's values and GOCs.  Questions and concerns were addressed.  The patient was encouraged to call with questions or concerns.  PMT will continue to support holistically.  Conference with attending, bedside nursing staff, transition of care team, hemodialysis nurse related to patient condition, needs, goals of  care.  Mr. Rawl has been followed closely by palliative.  He has been seen by 5 palliative providers over 4 different hospitalizations.  HCPOA   NEXT OF KIN - Mr. Hislop makes his own choices, sons are healthcare surrogates.     SUMMARY OF RECOMMENDATIONS   Continue to treat the treatable, but no CPR or intubation.  Times for outcomes Agreeable to continue HD at this point.    Code Status/Advance Care Planning: DNR  Symptom Management:  Per hospitalist, no additional needs at this time.   Palliative Prophylaxis:  Oral Care, Palliative Wound Care, and Turn Reposition  Additional Recommendations (Limitations, Scope, Preferences): Continue to treat the treatable but  no CPR or intubation At this point agreeable to continue HD  Psycho-social/Spiritual:  Desire for further Chaplaincy support: No Additional Recommendations: Caregiving  Support/Resources  Prognosis:  Unable to determine, based on outcomes.  If Mr. Hulbert declines HD, 2 weeks expected.  If he continues to seek treatments. 6 months or less would be anticipated.    Discharge Planning:  To be determined, based on outcomes.  He is considering placement in the [facility] assisted living home.  Mr. Wolden is working closely with his [place] Education officer, museum.       Primary Diagnoses: Present on Admission:  Metabolic encephalopathy  BPH (benign prostatic hyperplasia)  Chronic pain  ESRD (end stage renal disease) ([place])  Complicated UTI (urinary tract infection)   I have reviewed the medical record, interviewed the patient and family, and examined the patient. The following aspects are pertinent.  Past Medical History:  Diagnosis Date   Anemia    Cervical radiculopathy    with left sided weakness   Chemical exposure    service related injury   Chronic pain    CKD (chronic kidney disease), stage IV ([place]) 10/17/2019   COVID-19    Dyspnea    GERD (gastroesophageal reflux disease)    GIB (gastrointestinal bleeding)    Headache    Heart attack ([place])    History of CVA (cerebrovascular accident) 12/31/2019   History of gastric bypass 08/24/2019   History of stomach ulcers 05/19/2013   Formatting of this note might be different from the original. Attributed to chronic ibuprofen use   Hodgkin lymphoma, unspecified, unspecified site ([place]) 12/10/2012   Formatting of this note might be different from the original. In remission.   Hypokalemia    Hypomagnesemia    PTSD (post-traumatic stress disorder)    Renal disorder    CKD   Stroke [facility])    Urinary retention    due to bladder outlet obstruction   Social History   Socioeconomic History   Marital status: Widowed    Spouse name: Not on file   Number  of children: Not on file   Years of education: Not on file   Highest education level: Not on file  Occupational History   Not on file  Tobacco Use   Smoking status: Never   Smokeless tobacco: Current  Vaping Use   Vaping Use: Never used  Substance and Sexual Activity   Alcohol use: Never   Drug use: Never   Sexual activity: Not on file  Other Topics Concern   Not on file  Social History Narrative   Widow, Social research officer, government veteran served in [place] gets care through the [place] as well   2 sons   Lives alone, his mother has been helping him in 2021 with illnesses   Former smoker no alcohol  or drug  use now   Does use smokeless tobacco   2 caffeinated beverages daily   Social Determinants of Health   Financial Resource Strain: Not on file  Food Insecurity: Not on file  Transportation Needs: Not on file  Physical Activity: Not on file  Stress: Not on file  Social Connections: Not on file   Family History  Problem Relation Age of Onset   Renal cancer Father        mets to liver and lungs and pancreas   Heart disease Father    Heart disease Paternal Grandfather    Colon cancer Maternal Grandmother    Colon cancer Paternal Uncle    Stomach cancer Neg Hx    Esophageal cancer Neg Hx    Pancreatic cancer Neg Hx    Scheduled Meds:  (feeding supplement) PROSource Plus  30 mL Oral TID BM   aspirin EC  81 mg Oral Q breakfast   atorvastatin  10 mg Oral Daily   cephALEXin  250 mg Oral Q12H   Chlorhexidine Gluconate Cloth  6 each Topical Daily   [START ON 11/28/2020] darbepoetin (ARANESP) injection - DIALYSIS  60 mcg Intravenous Q Tue-HD   DULoxetine  30 mg Oral Daily   gabapentin  100 mg Oral BID   heparin  5,000 Units Subcutaneous Q8H   midodrine  10 mg Oral TID   pantoprazole  40 mg Oral Daily   Continuous Infusions: PRN Meds:.acetaminophen **OR** acetaminophen, hydrOXYzine, oxyCODONE-acetaminophen, polyethylene glycol Medications Prior to Admission:  Prior to Admission  medications   Medication Sig Start Date End Date Taking? Authorizing Provider  acetaminophen (TYLENOL) 500 MG tablet Take 1 tablet (500 mg total) by mouth every 6 (six) hours as needed for mild pain, fever or headache. 05/01/20   Samella Parr, NP  calcium carbonate (TUMS - DOSED IN MG ELEMENTAL CALCIUM) 500 MG chewable tablet Chew 1 tablet (200 mg of elemental calcium total) by mouth 3 (three) times daily. 05/01/20   Samella Parr, NP  cyanocobalamin (,VITAMIN B-12,) 1000 MCG/ML injection Inject 1,000 mcg into the muscle every 30 (thirty) days.  12/28/13   [provider]  DULoxetine (CYMBALTA) 30 MG capsule Take 1 capsule (30 mg total) by mouth 2 (two) times daily. 07/07/20 07/07/21  Raiford Noble Latif, DO  gabapentin (NEURONTIN) 100 MG capsule Take 100 mg by mouth. Take 1 capsule by mouth on Mon,Wed, and Friday for nerve pain. Take after dialysis 10/27/20   [provider]  HYDROcodone-acetaminophen (NORCO/VICODIN) 5-325 MG tablet TAKE 1 TABLET BY MOUTH TWICE A DAY FOR CHRONIC PAIN (8/3W) 11/01/20   [provider]  hydrOXYzine (ATARAX/VISTARIL) 10 MG tablet Take 1 tablet (10 mg total) by mouth 3 (three) times daily as needed for itching or anxiety. 07/07/20   Raiford Noble Latif, DO  midodrine (PROAMATINE) 10 MG tablet Take 1 tablet (10 mg total) by mouth daily. 10/25/20   Shelly Coss, MD  Multiple Vitamin (MULTIVITAMIN WITH MINERALS) TABS tablet Take 1 tablet by mouth daily. 05/03/20   Samella Parr, NP  Naloxone HCl (NARCAN NA) Place 4 mg into the nose once. Spray 1 spray into one nostril as directed  ( turn person on side after dose). If no response in 2 to 3 minutes or person responds but relapes a new sprays,repeat  using  a new spray.    [provider]  pantoprazole (PROTONIX) 20 MG tablet TAKE 1 TABLET (20 MG TOTAL) BY MOUTH 2 (TWO) TIMES DAILY. 05/03/20 05/03/21  Erin Hearing  L, NP  polycarbophil (FIBERCON) 625 MG tablet Take 1 tablet (625 mg total) by mouth  2 (two) times daily. Patient not taking: Reported on 10/07/2020 05/01/20   Samella Parr, NP  polyethylene glycol (MIRALAX / GLYCOLAX) 17 g packet Take 17 g by mouth daily as needed for moderate constipation. 07/07/20   Raiford Noble Latif, DO  prazosin (MINIPRESS) 1 MG capsule Take 1 mg by mouth at bedtime.    [provider]  silodosin (RAPAFLO) 8 MG CAPS capsule Take 1 capsule (8 mg total) by mouth daily with breakfast. 07/07/20   Raiford Noble Latif, DO  sodium bicarbonate 650 MG tablet TAKE 1 TABLET (650 MG TOTAL) BY MOUTH 2 (TWO) TIMES DAILY. 05/03/20 05/03/21  Samella Parr, NP  tamsulosin (FLOMAX) 0.4 MG CAPS capsule Take 0.4 mg by mouth at bedtime.    [provider]  Vitamin D3 (VITAMIN D) 25 MCG tablet TAKE 800 UNITS BY MOUTH DAILY. Patient not taking: Reported on 10/07/2020 05/03/20 05/03/21  Samella Parr, NP   Allergies  Allergen Reactions   Nsaids Other (See Comments)    Bleeding GI Ulceration   Ciprofloxacin Nausea And Vomiting   Review of Systems  Unable to perform ROS: Other   Physical Exam Vitals and nursing note reviewed.  Constitutional:      General: He is not in acute distress.    Appearance: He is ill-appearing.  HENT:     Mouth/Throat:     Mouth: Mucous membranes are moist.  Cardiovascular:     Rate and Rhythm: Normal rate.  Pulmonary:     Effort: Pulmonary effort is normal. No respiratory distress.  Abdominal:     General: Abdomen is flat.     Comments: ostomy  Skin:    General: Skin is warm and dry.  Neurological:     Mental Status: He is alert and oriented to person, place, and time.  Psychiatric:        Mood and Affect: Mood normal.        Behavior: Behavior normal.    Vital Signs: BP 106/72 (BP Location: Right Arm)   Pulse 70   Temp 98.8 F (37.1 C) (Oral)   Resp 16   Ht 5\' 8"  (1.727 m)   Wt 49.3 kg   SpO2 98%   BMI 16.53 kg/m  Pain Scale: 0-10   Pain Score: Asleep   SpO2: SpO2: 98 % O2 Device:SpO2: 98 % O2 Flow Rate:  .O2 Flow Rate (L/min): 0 L/min  IO: Intake/output summary:  Intake/Output Summary (Last 24 hours) at 11/27/2020 1308 Last data filed at 11/27/2020 0900 Gross per 24 hour  Intake 240 ml  Output 1950 ml  Net -1710 ml    LBM: Last BM Date: 11/26/20 Baseline Weight: Weight: 64 kg Most recent weight: Weight: 49.3 kg     Palliative Assessment/Data:   Flowsheet Rows    Flowsheet Row Most Recent Value  Intake Tab   Referral Department Hospitalist  Unit at Time of Referral Med/Surg Unit  Palliative Care Primary Diagnosis Nephrology  Date Notified 11/24/20  Palliative Care Type Return patient Palliative Care  Reason for referral Clarify Goals of Care  Date of Admission 11/23/20  Date first seen by Palliative Care 11/27/20  # of days Palliative referral response time 3 Day(s)  # of days IP prior to Palliative referral 1  Clinical Assessment   Palliative Performance Scale Score 40%  Pain Max last 24 hours Not able to report  Pain Min Last  24 hours Not able to report  Dyspnea Max Last 24 Hours Not able to report  Dyspnea Min Last 24 hours Not able to report  Psychosocial & Spiritual Assessment   Palliative Care Outcomes        Time In: 0940 Time Out: 0930 Time Total: 50 minutes  Greater than 50%  of this time was spent counseling and coordinating care related to the above assessment and plan.  Signed by: Drue Novel, NP   Please contact Palliative Medicine Team phone at [phone] for questions and concerns.  For individual provider: See Shea Evans

## 2020-11-27 NOTE — Progress Notes (Signed)
Admit: 11/23/2020 LOS: 4  19M ESRD [facility] with FTT, nonadherence to HD  Subjective:  For palliative meeting today; still exploring [place] last HD 8/27  K 2.7 this AM, already repleted Pain well controlled ,feels weak   08/28 0701 - 08/29 0700 In: 720 [P.O.:720] Out: 1300 [Urine:1300]  Filed Weights   11/23/20 1416 11/23/20 2156  Weight: 64 kg 49.3 kg    Scheduled Meds:  (feeding supplement) PROSource Plus  30 mL Oral TID BM   aspirin EC  81 mg Oral Q breakfast   atorvastatin  10 mg Oral Daily   cephALEXin  250 mg Oral Q12H   Chlorhexidine Gluconate Cloth  6 each Topical Daily   DULoxetine  30 mg Oral Daily   gabapentin  100 mg Oral BID   heparin  5,000 Units Subcutaneous Q8H   midodrine  10 mg Oral TID   pantoprazole  40 mg Oral Daily   potassium chloride  40 mEq Oral Q3H   sodium bicarbonate  650 mg Oral BID   Continuous Infusions: PRN Meds:.acetaminophen **OR** acetaminophen, hydrOXYzine, oxyCODONE-acetaminophen, polyethylene glycol  Current Labs: reviewed    Physical Exam:  Blood pressure 106/72, pulse 70, temperature 98.8 F (37.1 C), temperature source Oral, resp. rate 16, height 5\' 8"  (1.727 m), weight 49.3 kg, SpO2 98 %. 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 Morphine should not be used.  Baclofen should be  avoided Avoid oral sodium phosphate and magnesium citrate based laxatives / bowel preps    Pearson Grippe MD 11/27/2020, 9:04 AM  Recent Labs  Lab 11/24/20 0620 11/25/20 0415 11/27/20 0522  NA 140 135 137  K 5.3* 3.1* 2.7*  CL 113* 104 101  CO2 13* 22 26  GLUCOSE 84 101* 147*  BUN 77* 57* 43*  CREATININE 6.90* 4.77* 4.58*  CALCIUM 9.0 8.2* 8.2*  PHOS  --  3.8 4.3   Recent Labs  Lab 11/23/20 1554 11/24/20 0620 11/25/20 0415 11/27/20 0522  WBC 7.8 7.3 6.0 6.8  NEUTROABS 6.8  --   --   --   HGB 11.1* 10.6* 9.7* 9.6*  HCT 37.1* 34.7* 30.6* 31.1*  MCV 103.1* 103.0* 99.4 99.4  PLT 402* 401* 386 347

## 2020-11-27 NOTE — Progress Notes (Signed)
Patient Demographics:    Chase [name], is a 55 y.o. male, DOB - 1966/01/23, EXB:[id number]  Admit date - 11/23/2020   Admitting Physician Bethena Roys, MD  Outpatient Primary MD for the patient is [name]  LOS - 4   Chief Complaint  Patient presents with   Failure To Thrive        Subjective:    Chase [name] today has no fevers, no emesis,  No chest pain,   No new complaints  -Patient is in a better mood, he states that he would like to continue with hemodialysis  Assessment  & Plan :    Principal Problem:   Metabolic encephalopathy Active Problems:   Chronic pain   BPH (benign prostatic hyperplasia)   History of CVA (cerebrovascular accident)   DNR (do not resuscitate) discussion   ESRD (end stage renal disease) ([place])   Complicated UTI (urinary tract infection)   Malnutrition of moderate degree  Brief Summary:- 55 y.o. male with medical history significant for ESRD, CVA, Pseudomonas UTI, GI bleed, Hodgkin's lymphoma, chronic pain admitted on 11/22/2020 with acute metabolic encephalopathy in the setting of missed hemodialysis sessions (did Not have HD for 2 wks PTA)  A/p 1)Acute metabolic encephalopathy--due to missed HD sessions --- Patient had  serial/back-to-back HD sessions on 11/24/2020 on 11/25/2020 [place] appears back to baseline after back-to-back hemodialysis sessions  2)Social/Ethics----discussed plan of care and advanced directives with patient, patient's son [name] from [place] and patient's son [name] from [place]--- -Patient request DNR/DNI status he states that he would like to continue with hemodialysis -Official palliative care consult appreciated  3) complicated CAUTI--previously grew Pseudomonas as well as E. Coli -Repeat urine culture from 11/23/2020 with pansensitive E. Coli--s okay to stop cefepime and de-escalated to Keflex on  11/26/20 - 4)ESRD--missed about 2 weeks of HD sessions, -Discussed with nephrology---- Patient had  serial/back-to-back HD sessions on 11/24/2020 on 11/25/2020 -Further HD per nephrology team -Last for HD on 11/28/2020  5)BPH with LUTs----chronic indwelling Foley due to chronic urinary retention, changed 11/23/2020  6)H/o CVA--mild residual left-sided hemiparesis  -- Continue aspirin and Lipitor for secondary stroke prophylaxis  7) chronic pain syndrome/PTSD/anxiety--- Cymbalta, gabapentin , prazosin and Percocet as prescribed -As needed hydroxyzine  8) anemia of ESRD----H&H stable, ESA/Epogen per nephrology team  9) generalized weakness and deconditioning and disposition---- he states that he would like to continue with hemodialysis -Possible discharge home on 11/28/2020 after hemodialysis -Talking to his Education officer, museum at the [place] to see if he can move into ALF -- He is reluctant to have home health PT postdischarge ?? SNF  -- Await physical therapy eval  Disposition/Need for in-Hospital Stay- patient unable to be discharged at this time due to --- metabolic encephalopathy secondary to missed HD sessions requiring--serial/back-to-back HD sessions  Status is: Inpatient  Remains inpatient appropriate because: Please see disposition above  Disposition: The patient is from: Home              Anticipated d/c is to: Home              Anticipated d/c date is: 1 day--possible discharge home on 11/28/2020 after hemodialysis--pending physical therapy eval  Patient currently is not medically stable to d/c. Barriers: Not Clinically Stable-   Code Status :  -  Code Status: DNR   Family Communication:    (patient is alert, awake and coherent)  -patient's son [name] from [place] and patient's son [name] from [place]  :  Nephrology/Palliative  DVT Prophylaxis  :   - SCDs  heparin injection 5,000 Units Start: 11/23/20 2200    Lab Results  Component Value Date   PLT 347  11/27/2020    Inpatient Medications  Scheduled Meds:  (feeding supplement) PROSource Plus  30 mL Oral TID BM   aspirin EC  81 mg Oral Q breakfast   atorvastatin  10 mg Oral Daily   cephALEXin  250 mg Oral Q12H   Chlorhexidine Gluconate Cloth  6 each Topical Daily   [START ON 11/28/2020] darbepoetin (ARANESP) injection - DIALYSIS  60 mcg Intravenous Q Tue-HD   DULoxetine  30 mg Oral Daily   gabapentin  100 mg Oral BID   heparin  5,000 Units Subcutaneous Q8H   midodrine  10 mg Oral TID   pantoprazole  40 mg Oral Daily   Continuous Infusions:   PRN Meds:.acetaminophen **OR** acetaminophen, hydrOXYzine, oxyCODONE-acetaminophen, polyethylene glycol    Anti-infectives (From admission, onward)    Start     Dose/Rate Route Frequency Ordered Stop   11/26/20 2200  cephALEXin (KEFLEX) capsule 250 mg        250 mg Oral Every 12 hours 11/26/20 1902     11/24/20 2200  ceFEPIme (MAXIPIME) 1 g in sodium chloride 0.9 % 100 mL IVPB  Status:  Discontinued        1 g 200 mL/hr over 30 Minutes Intravenous Every 24 hours 11/24/20 1034 11/26/20 1902   11/24/20 1600  ceFEPIme (MAXIPIME) 2 g in sodium chloride 0.9 % 100 mL IVPB  Status:  Discontinued        2 g 200 mL/hr over 30 Minutes Intravenous Every M-W-F (Hemodialysis) 11/23/20 2026 11/24/20 1034   11/23/20 2030  ceFEPIme (MAXIPIME) 2 g in sodium chloride 0.9 % 100 mL IVPB        2 g 200 mL/hr over 30 Minutes Intravenous  Once 11/23/20 2022 11/23/20 2347   11/23/20 1815  cefTRIAXone (ROCEPHIN) 1 g in sodium chloride 0.9 % 100 mL IVPB        1 g 200 mL/hr over 30 Minutes Intravenous  Once 11/23/20 1814 11/23/20 1930         Objective:   Vitals:   11/26/20 1412 11/26/20 2008 11/27/20 0408 11/27/20 1354  BP: 120/76 123/71 106/72 (!) 101/49  Pulse: 74 60 70 60  Resp: 17 16 16 18   Temp: 98.9 F (37.2 C) 98.7 F (37.1 C) 98.8 F (37.1 C) 99.2 F (37.3 C)  TempSrc:  Oral Oral Oral  SpO2: 98% 98%  98%  Weight:      Height:         Wt Readings from Last 3 Encounters:  11/23/20 49.3 kg  10/24/20 63.6 kg  07/07/20 65.7 kg     Intake/Output Summary (Last 24 hours) at 11/27/2020 1721 Last data filed at 11/27/2020 0900 Gross per 24 hour  Intake 240 ml  Output 1950 ml  Net -1710 ml    Physical Exam  Gen:- Awake Alert, chronically ill-and cachectic appearing HEENT:- [place].AT, No sclera icterus Neck-right IJ HD catheter Lungs-  CTAB , fair symmetrical air movement CV- S1, S2 normal, regular  Abd-  +ve B.Sounds, Abd  Soft, No tenderness, ostomy bag with fecal materia Extremity/Skin:- No  edema, pedal pulses present  Psych-affect is appropriate, oriented x3 Neuro-generalized weakness, no new focal deficits, no tremors GU--chronic Foley with urine   Data Review:   Micro Results Recent Results (from the past 240 hour(s))  Blood culture (routine single)     Status: None (Preliminary result)   Collection Time: 11/23/20  3:54 PM   Specimen: BLOOD  Result Value Ref Range Status   Specimen Description BLOOD RIGHT ANTECUBITAL  Final   Special Requests   Final    BOTTLES DRAWN AEROBIC AND ANAEROBIC Blood Culture adequate volume   Culture   Final    NO GROWTH 4 DAYS Performed at [facility], [address], [place], [place] [address]    Report Status PENDING  Incomplete  Resp Panel by RT-PCR (Flu A&B, Covid) Nasopharyngeal Swab     Status: None   Collection Time: 11/23/20  3:59 PM   Specimen: Nasopharyngeal Swab; Nasopharyngeal(NP) swabs in vial transport medium  Result Value Ref Range Status   SARS Coronavirus 2 by RT PCR NEGATIVE NEGATIVE Final    Comment: (NOTE) SARS-CoV-2 target nucleic acids are NOT DETECTED.  The SARS-CoV-2 RNA is generally detectable in upper respiratory specimens during the acute phase of infection. The lowest concentration of SARS-CoV-2 viral copies this assay can detect is 138 copies/mL. A negative result does not preclude SARS-Cov-2 infection and should not be used as the sole  basis for treatment or other patient management decisions. A negative result may occur with  improper specimen collection/handling, submission of specimen other than nasopharyngeal swab, presence of viral mutation(s) within the areas targeted by this assay, and inadequate number of viral copies(<138 copies/mL). A negative result must be combined with clinical observations, patient history, and epidemiological information. The expected result is Negative.  Fact Sheet for Patients:  EntrepreneurPulse.com.au  Fact Sheet for Healthcare Providers:  IncredibleEmployment.be  This test is no t yet approved or cleared by the [place] FDA and  has been authorized for detection and/or diagnosis of SARS-CoV-2 by FDA under an Emergency Use Authorization (EUA). This EUA will remain  in effect (meaning this test can be used) for the duration of the COVID-19 declaration under Section 564(b)(1) of the Act, 21 U.S.C.section 360bbb-3(b)(1), unless the authorization is terminated  or revoked sooner.       Influenza A by PCR NEGATIVE NEGATIVE Final   Influenza B by PCR NEGATIVE NEGATIVE Final    Comment: (NOTE) The Xpert Xpress SARS-CoV-2/FLU/RSV plus assay is intended as an aid in the diagnosis of influenza from Nasopharyngeal swab specimens and should not be used as a sole basis for treatment. Nasal washings and aspirates are unacceptable for Xpert Xpress SARS-CoV-2/FLU/RSV testing.  Fact Sheet for Patients: EntrepreneurPulse.com.au  Fact Sheet for Healthcare Providers: IncredibleEmployment.be  This test is not yet approved or cleared by the [place] FDA and has been authorized for detection and/or diagnosis of SARS-CoV-2 by FDA under an Emergency Use Authorization (EUA). This EUA will remain in effect (meaning this test can be used) for the duration of the COVID-19 declaration under Section 564(b)(1) of the Act,  21 U.S.C. section 360bbb-3(b)(1), unless the authorization is terminated or revoked.  Performed at [facility], [address], [place], [place] [address]   Urine Culture     Status: Abnormal   Collection Time: 11/23/20  7:56 PM   Specimen: Urine, Catheterized  Result Value Ref Range Status   Specimen Description   Final  URINE, CATHETERIZED Performed at [facility], [address], [place], [place] [address]    Special Requests   Final    NONE Performed at [facility], [address], [place], [place] [address]    Culture >=100,000 COLONIES/mL ESCHERICHIA COLI (A)  Final   Report Status 11/26/2020 FINAL  Final   Organism ID, Bacteria ESCHERICHIA COLI (A)  Final      Susceptibility   Escherichia coli - MIC*    AMPICILLIN 4 SENSITIVE Sensitive     CEFAZOLIN <=4 SENSITIVE Sensitive     CEFEPIME <=0.12 SENSITIVE Sensitive     CEFTRIAXONE <=0.25 SENSITIVE Sensitive     CIPROFLOXACIN <=0.25 SENSITIVE Sensitive     GENTAMICIN <=1 SENSITIVE Sensitive     IMIPENEM <=0.25 SENSITIVE Sensitive     NITROFURANTOIN <=16 SENSITIVE Sensitive     TRIMETH/SULFA <=20 SENSITIVE Sensitive     AMPICILLIN/SULBACTAM <=2 SENSITIVE Sensitive     PIP/TAZO <=4 SENSITIVE Sensitive     * >=100,000 COLONIES/mL ESCHERICHIA COLI  MRSA Next Gen by PCR, Nasal     Status: None   Collection Time: 11/25/20  5:47 AM   Specimen: Nasal Mucosa; Nasal Swab  Result Value Ref Range Status   MRSA by PCR Next Gen NOT DETECTED NOT DETECTED Final    Comment: (NOTE) The GeneXpert MRSA Assay (FDA approved for NASAL specimens only), is one component of a comprehensive MRSA colonization surveillance program. It is not intended to diagnose MRSA infection nor to guide or monitor treatment for MRSA infections. Test performance is not FDA approved in patients less than 43 years old. Performed at [facility], [address] [place],  [address]     Radiology Reports CT HEAD WO CONTRAST (5MM)  Result Date:  11/23/2020 CLINICAL DATA:  Delirium, hallucinations EXAM: CT HEAD WITHOUT CONTRAST TECHNIQUE: Contiguous axial images were obtained from the base of the skull through the vertex without intravenous contrast. COMPARISON:  06/27/2020 FINDINGS: Brain: No acute infarct or hemorrhage. Stable small chronic right occipital cortical infarct. Lateral ventricles and midline structures are unremarkable. There are no acute extra-axial fluid collections. There is no mass effect. Vascular: No hyperdense vessel or unexpected calcification. Skull: Normal. Negative for fracture or focal lesion. Sinuses/Orbits: No acute finding. Other: None. IMPRESSION: 1. Stable head CT.  No acute intracranial process. Electronically Signed   By: Randa Ngo M.D.   On: 11/23/2020 17:56   DG Chest Port 1 View  Result Date: 11/23/2020 CLINICAL DATA:  Weakness, no dialysis for 13 days EXAM: PORTABLE CHEST 1 VIEW COMPARISON:  Chest radiograph 10/16/2020 FINDINGS: A right chest wall dialysis catheter is in stable position. The cardiomediastinal silhouette is stable. There is unchanged asymmetric elevation of the right hemidiaphragm. Aeration of the right base is improved compared to the prior study. There is no focal consolidation or pulmonary edema on the current study. There is no pleural effusion or pneumothorax. Right upper quadrant and right axillary surgical clips are noted. There is no acute osseous abnormality. IMPRESSION: Improved aeration of the right base compared to 10/16/2020. No radiographic evidence of acute cardiopulmonary process currently. Electronically Signed   By: Valetta Mole M.D.   On: 11/23/2020 15:44     CBC Recent Labs  Lab 11/23/20 1554 11/24/20 0620 11/25/20 0415 11/27/20 0522  WBC 7.8 7.3 6.0 6.8  HGB 11.1* 10.6* 9.7* 9.6*  HCT 37.1* 34.7* 30.6* 31.1*  PLT 402* 401* 386 347  MCV 103.1* 103.0* 99.4 99.4  MCH 30.8 31.5 31.5 30.7  MCHC 29.9*  30.5 31.7 30.9  RDW 16.5* 16.4* 16.0* 15.9*  LYMPHSABS 0.4*  --    --   --   MONOABS 0.6  --   --   --   EOSABS 0.1  --   --   --   BASOSABS 0.0  --   --   --     Chemistries  Recent Labs  Lab 11/23/20 1554 11/24/20 0620 11/25/20 0415 11/27/20 0522  NA 136 140 135 137  K 5.5* 5.3* 3.1* 2.7*  CL 114* 113* 104 101  CO2 11* 13* 22 26  GLUCOSE 90 84 101* 147*  BUN 72* 77* 57* 43*  CREATININE 6.79* 6.90* 4.77* 4.58*  CALCIUM 8.7* 9.0 8.2* 8.2*  AST 13*  --   --   --   ALT 16  --   --   --   ALKPHOS 166*  --   --   --   BILITOT 0.8  --   --   --    ------------------------------------------------------------------------------------------------------------------ No results for input(s): CHOL, HDL, LDLCALC, TRIG, CHOLHDL, LDLDIRECT in the last 72 hours.  Lab Results  Component Value Date   HGBA1C 5.4 10/18/2019   ------------------------------------------------------------------------------------------------------------------ No results for input(s): TSH, T4TOTAL, T3FREE, THYROIDAB in the last 72 hours.  Invalid input(s): FREET3 ------------------------------------------------------------------------------------------------------------------ Recent Labs    11/27/20 0522  FERRITIN 382*  TIBC 217*  IRON 42*    Coagulation profile Recent Labs  Lab 11/23/20 1554  INR 1.2    No results for input(s): DDIMER in the last 72 hours.  Cardiac Enzymes No results for input(s): CKMB, TROPONINI, MYOGLOBIN in the last 168 hours.  Invalid input(s): CK ------------------------------------------------------------------------------------------------------------------ No results found for: BNP  Roxan Hockey M.D on 11/27/2020 at 5:21 PM  Go to [email or web address] - for contact info  [facility] - Office  [phone]

## 2020-11-28 ENCOUNTER — Encounter (HOSPITAL_COMMUNITY): Payer: No Typology Code available for payment source

## 2020-11-28 DIAGNOSIS — N186 End stage renal disease: Secondary | ICD-10-CM

## 2020-11-28 DIAGNOSIS — G9341 Metabolic encephalopathy: Secondary | ICD-10-CM

## 2020-11-28 DIAGNOSIS — N390 Urinary tract infection, site not specified: Secondary | ICD-10-CM

## 2020-11-28 DIAGNOSIS — Z515 Encounter for palliative care: Secondary | ICD-10-CM

## 2020-11-28 LAB — CBC
HCT: 29.3 % — ABNORMAL LOW (ref 39.0–52.0)
Hemoglobin: 8.8 g/dL — ABNORMAL LOW (ref 13.0–17.0)
MCH: 30.2 pg (ref 26.0–34.0)
MCHC: 30 g/dL (ref 30.0–36.0)
MCV: 100.7 fL — ABNORMAL HIGH (ref 80.0–100.0)
Platelets: 328 10*3/uL (ref 150–400)
RBC: 2.91 MIL/uL — ABNORMAL LOW (ref 4.22–5.81)
RDW: 15.8 % — ABNORMAL HIGH (ref 11.5–15.5)
WBC: 10.1 10*3/uL (ref 4.0–10.5)
nRBC: 0 % (ref 0.0–0.2)

## 2020-11-28 LAB — RENAL FUNCTION PANEL
Albumin: 2.5 g/dL — ABNORMAL LOW (ref 3.5–5.0)
Anion gap: 10 (ref 5–15)
BUN: 50 mg/dL — ABNORMAL HIGH (ref 6–20)
CO2: 23 mmol/L (ref 22–32)
Calcium: 8.2 mg/dL — ABNORMAL LOW (ref 8.9–10.3)
Chloride: 105 mmol/L (ref 98–111)
Creatinine, Ser: 5.3 mg/dL — ABNORMAL HIGH (ref 0.61–1.24)
GFR, Estimated: 12 mL/min — ABNORMAL LOW (ref >60)
Glucose, Bld: 101 mg/dL — ABNORMAL HIGH (ref 70–99)
Phosphorus: 3.6 mg/dL (ref 2.5–4.6)
Potassium: 3.9 mmol/L (ref 3.5–5.1)
Sodium: 138 mmol/L (ref 135–145)

## 2020-11-28 LAB — CULTURE, BLOOD (SINGLE)
Culture: NO GROWTH
Special Requests: ADEQUATE

## 2020-11-28 MED ORDER — DARBEPOETIN ALFA 60 MCG/0.3ML IJ SOSY
PREFILLED_SYRINGE | INTRAMUSCULAR | Status: AC
  Administered 2020-11-28: 60 ug via INTRAVENOUS
  Filled 2020-11-28: qty 0.3

## 2020-11-28 MED ORDER — HEPARIN SODIUM (PORCINE) 1000 UNIT/ML IJ SOLN
INTRAMUSCULAR | Status: AC
  Administered 2020-11-28: 3200 [IU]
  Filled 2020-11-28: qty 6

## 2020-11-28 NOTE — TOC Progression Note (Signed)
Transition of Care [facility]) - Progression Note    Patient Details  Name: Chase [name] MRN: [id number] Date of Birth: 08-06-1965  Transition of Care [facility]) CM/SW Contact  Salome Arnt, [place] Phone Number: 11/28/2020, 3:38 PM  Clinical Narrative:  TOC received call from pt's son, [name] and Anderson Malta, Education officer, museum with [facility]. Anderson Malta reports [place] does not pay for ALF. Pt refused PT today, stating he wants to have a meeting with team regarding whether or not he wants to continue dialysis and d/c plan. [name] also wants to be involved in meeting as well as his brother, but this will have to be on phone as they are out of town.  TOC notified attending, palliative, and nephrologist of request for pt/family meeting. Will continue to follow.        Barriers to Discharge: Continued Medical Work up  Expected Discharge Plan and Services                                                 Social Determinants of Health (SDOH) Interventions    Readmission Risk Interventions Readmission Risk Prevention Plan 10/24/2020 07/04/2020 12/25/2019  Transportation Screening Complete Complete Complete  Medication Review Press photographer) Complete Complete Complete  PCP or Specialist appointment within 3-5 days of discharge Complete Complete -  [place] or Home Care Consult Complete Complete -  SW Recovery Care/Counseling Consult Complete Complete -  Palliative Care Screening Complete Not Applicable -  [place] Patient Refused Not Applicable -  Some recent data might be hidden

## 2020-11-28 NOTE — Plan of Care (Signed)
  Problem: Education: Goal: Knowledge of General Education information will improve Description Including pain rating scale, medication(s)/side effects and non-pharmacologic comfort measures Outcome: Progressing   Problem: Health Behavior/Discharge Planning: Goal: Ability to manage health-related needs will improve Outcome: Progressing   

## 2020-11-28 NOTE — Progress Notes (Signed)
Palliative:   [name] is lying quietly in bed in the HD suite. He greets me, making and somewhat keeping eye contact.  He appears acutely/chronically ill and frail.  He is alert and oriented and able to make his needs known.  I ask how he is doing with HD and he tells me that he id doing ok. He requests graham cracker and ice water which are provided.  He is able to eat and drink without issue.    [name] tells me that he is working with his SW with the [place] for next steps. In patient TOC is also working with him for support.   Conference with attending, bedside nursing staff, and [facility] team related to patient condition, needs and GOC.   Plan:  Continue to treat the treatable, but no CPR or intubation.  Unsure of disposition location, historically [name] returns to his home after hospitalization. Declines out patient palliative services.   25 minutes  Chase Axe, NP Palliative Medicine Team  Team Phone [phone] Greater than 50% of this time was spent counseling and coordinating care related to the above assessment and plan.

## 2020-11-28 NOTE — Progress Notes (Signed)
PT Cancellation Note  Patient Details Name: Chase [name] MRN: [id number] DOB: 1965/04/10   Cancelled Treatment:    Reason Eval/Treat Not Completed: Patient declined, no reason specified.  Patient declined secondary to c/o fatigue after dialysis.  Patient states he is also concerned about having a meeting with MD, SW, and his family about plans to go home or to [place] assisted living or discontinue dialysis and go on hospice?  2:56 PM, 11/28/20 Lonell Grandchild, MPT Physical Therapist with [facility] [phone] office [phone] mobile phone

## 2020-11-28 NOTE — Progress Notes (Signed)
Patient Demographics:    Chase [name], is a 55 y.o. male, DOB - [birth year], JYN:[id number]  Admit date - 11/23/2020   Admitting Physician Bethena Roys, MD  Outpatient Primary MD for the patient is [facility], [name]  LOS - 5   Chief Complaint  Patient presents with   Failure To Thrive        Subjective:    Chase [name] no chest pain, no nausea, no vomiting, no shortness of breath.  Patient alert, oriented x3 and demonstrating good insight.  Chronically ill in appearance weak and deconditioned.  Has tolerated dialysis without any complications.   Assessment  & Plan :    Principal Problem:   Metabolic encephalopathy Active Problems:   Chronic pain   BPH (benign prostatic hyperplasia)   History of CVA (cerebrovascular accident)   DNR (do not resuscitate) discussion   ESRD (end stage renal disease) ([place])   Complicated UTI (urinary tract infection)   Malnutrition of moderate degree  Brief Summary:- 55 y.o. male with medical history significant for ESRD, CVA, Pseudomonas UTI, GI bleed, Hodgkin's lymphoma, chronic pain admitted on 11/22/2020 with acute metabolic encephalopathy in the setting of missed hemodialysis sessions (did Not have HD for 2 wks PTA)  A/p 1)Acute metabolic encephalopathy--due to missed HD sessions --- Patient had  serial/back-to-back HD sessions on 11/24/2020 on 11/25/2020 [place] appears back to baseline after back-to-back hemodialysis sessions. -Patient remains oriented x3 currently and coherent. -Well tolerated hemodialysis on 11/28/20  2)Social/Ethics----discussed plan of care and advanced directives with patient, patient's son Chase [name] from [place] and patient's son Chase [name] from [place]--- -Patient request DNR/DNI status. -he states that he would like to continue with hemodialysis and return home if possible. -Official palliative care consult appreciated;  will follow recommendations for goals of care and advance directive. -Still undecided next step into action for his care given lack of social support.  3) complicated CAUTI--previously grew Pseudomonas as well as pansensitive E. Coli -Repeat urine culture from 11/23/2020 with pansensitive E. Coli -Continue treatment with Keflex; IV antibiotic has been discontinued.  4)ESRD--missed about 2 weeks of HD sessions, -Discussed with nephrology---- Patient had  serial/back-to-back HD sessions on 11/24/2020 on 11/25/2020 -Further HD per nephrology team -Completed dialysis on 8/65/78 without complications  5)BPH with LUTs----chronic indwelling Foley due to chronic urinary retention, changed 11/23/2020  6)H/o CVA--mild residual left-sided hemiparesis  --Continue aspirin and Lipitor for secondary stroke prophylaxis  7) chronic pain syndrome/PTSD/anxiety--- Cymbalta, gabapentin , prazosin and Percocet as prescribed -Continue as needed hydroxyzine  8) anemia of ESRD----H&H stable, ESA/Epogen per nephrology team -No overt bleeding appreciated -Continue to follow hemoglobin trend intermittently.  9) generalized weakness and deconditioning and disposition---- he states that he would like to continue with hemodialysis and go home; concerns of lack of social support and currently capacity to perform all his activities of daily living independently. -Pending physical therapy evaluation. -Patient has been talking to his social worker at the [place] to see if he can move into ALF; unfortunately no assisted living from [place] available at this time. -Await physical therapy eval and final medical decisions.  Disposition/Need for in-Hospital Stay- patient unable to be discharged at this time due to --- metabolic encephalopathy secondary to missed HD sessions requiring--serial/back-to-back HD sessions  Status is: Inpatient  Remains inpatient appropriate because: Please see disposition above  Disposition: The patient is  from: Home              Anticipated d/c is to: Home              Anticipated d/c date is: 1 day and then discharge decisions.              Patient currently is not medically stable to d/c. Barriers: Not Clinically Stable-   Code Status :  -  Code Status: DNR   Family Communication:    (patient is alert, awake and coherent); no family at bedside. -patient's son Chase [name] from [place] and patient's son Chase [name] from [place]; anticipating conjunctive meeting with patient and both sons to decide discharge pathway.  Consults  :  Nephrology/Palliative  DVT Prophylaxis  :   - SCDs  heparin injection 5,000 Units Start: 11/23/20 2200    Lab Results  Component Value Date   PLT 328 11/28/2020    Inpatient Medications  Scheduled Meds:  (feeding supplement) PROSource Plus  30 mL Oral TID BM   aspirin EC  81 mg Oral Q breakfast   atorvastatin  10 mg Oral Daily   calcium carbonate  1 tablet Oral TID WC   cephALEXin  250 mg Oral Q12H   Chlorhexidine Gluconate Cloth  6 each Topical Daily   darbepoetin (ARANESP) injection - DIALYSIS  60 mcg Intravenous Q Tue-HD   DULoxetine  30 mg Oral Daily   gabapentin  100 mg Oral BID   heparin  5,000 Units Subcutaneous Q8H   midodrine  10 mg Oral TID   pantoprazole  40 mg Oral Daily   prazosin  1 mg Oral QHS   Continuous Infusions:   PRN Meds:.acetaminophen **OR** acetaminophen, hydrOXYzine, oxyCODONE-acetaminophen, polyethylene glycol   Anti-infectives (From admission, onward)    Start     Dose/Rate Route Frequency Ordered Stop   11/26/20 2200  cephALEXin (KEFLEX) capsule 250 mg        250 mg Oral Every 12 hours 11/26/20 1902     11/24/20 2200  ceFEPIme (MAXIPIME) 1 g in sodium chloride 0.9 % 100 mL IVPB  Status:  Discontinued        1 g 200 mL/hr over 30 Minutes Intravenous Every 24 hours 11/24/20 1034 11/26/20 1902   11/24/20 1600  ceFEPIme (MAXIPIME) 2 g in sodium chloride 0.9 % 100 mL IVPB  Status:  Discontinued        2 g 200 mL/hr over  30 Minutes Intravenous Every M-W-F (Hemodialysis) 11/23/20 2026 11/24/20 1034   11/23/20 2030  ceFEPIme (MAXIPIME) 2 g in sodium chloride 0.9 % 100 mL IVPB        2 g 200 mL/hr over 30 Minutes Intravenous  Once 11/23/20 2022 11/23/20 2347   11/23/20 1815  cefTRIAXone (ROCEPHIN) 1 g in sodium chloride 0.9 % 100 mL IVPB        1 g 200 mL/hr over 30 Minutes Intravenous  Once 11/23/20 1814 11/23/20 1930         Objective:   Vitals:   11/28/20 1100 11/28/20 1130 11/28/20 1200 11/28/20 1248  BP: (!) 107/56 (!) 98/57 (!) 100/57 117/66  Pulse: 68 68 63 63  Resp:    18  Temp:    99.1 F (37.3 C)  TempSrc:    Oral  SpO2:    100%  Weight:      Height:  Wt Readings from Last 3 Encounters:  11/23/20 49.3 kg  10/24/20 63.6 kg  07/07/20 65.7 kg     Intake/Output Summary (Last 24 hours) at 11/28/2020 1744 Last data filed at 11/28/2020 1736 Gross per 24 hour  Intake 240 ml  Output 2300 ml  Net -2060 ml    Physical Exam General exam: Alert, awake, oriented x 3, chronically ill in appearance and generally weak.  No chest pain, no nausea, no vomiting.  Patient expressed feeling significantly fatigued after dialysis treatment. Respiratory system: Good air movement bilaterally, no wheezing, no crackles. Cardiovascular system: Regular rate and rhythm, no rubs, no gallops, no JVD on exam.  Right IJ hemodialysis catheter in place. Gastrointestinal system: Abdomen is nondistended, soft and nontender. No organomegaly or masses felt. Normal bowel sounds heard. Central nervous system: Alert and oriented. No focal neurological deficits. Extremities: No cyanosis or clubbing. Skin/GU: No petechiae.  Chronic Foley catheter with some urine appreciated. Psychiatry: Judgement and insight appear normal. Mood & affect appropriate.     Data Review:   Micro Results Recent Results (from the past 240 hour(s))  Blood culture (routine single)     Status: None   Collection Time: 11/23/20  3:54 PM    Specimen: BLOOD  Result Value Ref Range Status   Specimen Description BLOOD RIGHT ANTECUBITAL  Final   Special Requests   Final    BOTTLES DRAWN AEROBIC AND ANAEROBIC Blood Culture adequate volume   Culture   Final    NO GROWTH 5 DAYS Performed at [facility], [address] [place], [place] [address]    Report Status 11/28/2020 FINAL  Final  Resp Panel by RT-PCR (Flu A&B, Covid) Nasopharyngeal Swab     Status: None   Collection Time: 11/23/20  3:59 PM   Specimen: Nasopharyngeal Swab; Nasopharyngeal(NP) swabs in vial transport medium  Result Value Ref Range Status   SARS Coronavirus 2 by RT PCR NEGATIVE NEGATIVE Final    Comment: (NOTE) SARS-CoV-2 target nucleic acids are NOT DETECTED.  The SARS-CoV-2 RNA is generally detectable in upper respiratory specimens during the acute phase of infection. The lowest concentration of SARS-CoV-2 viral copies this assay can detect is 138 copies/mL. A negative result does not preclude SARS-Cov-2 infection and should not be used as the sole basis for treatment or other patient management decisions. A negative result may occur with  improper specimen collection/handling, submission of specimen other than nasopharyngeal swab, presence of viral mutation(s) within the areas targeted by this assay, and inadequate number of viral copies(<138 copies/mL). A negative result must be combined with clinical observations, patient history, and epidemiological information. The expected result is Negative.  Fact Sheet for Patients:  EntrepreneurPulse.com.au  Fact Sheet for Healthcare Providers:  IncredibleEmployment.be  This test is no t yet approved or cleared by the [place] FDA and  has been authorized for detection and/or diagnosis of SARS-CoV-2 by FDA under an Emergency Use Authorization (EUA). This EUA will remain  in effect (meaning this test can be used) for the duration of the COVID-19 declaration under  Section 564(b)(1) of the Act, 21 U.S.C.section 360bbb-3(b)(1), unless the authorization is terminated  or revoked sooner.       Influenza A by PCR NEGATIVE NEGATIVE Final   Influenza B by PCR NEGATIVE NEGATIVE Final    Comment: (NOTE) The Xpert Xpress SARS-CoV-2/FLU/RSV plus assay is intended as an aid in the diagnosis of influenza from Nasopharyngeal swab specimens and should not be used as a sole basis for treatment. Nasal  washings and aspirates are unacceptable for Xpert Xpress SARS-CoV-2/FLU/RSV testing.  Fact Sheet for Patients: EntrepreneurPulse.com.au  Fact Sheet for Healthcare Providers: IncredibleEmployment.be  This test is not yet approved or cleared by the [place] FDA and has been authorized for detection and/or diagnosis of SARS-CoV-2 by FDA under an Emergency Use Authorization (EUA). This EUA will remain in effect (meaning this test can be used) for the duration of the COVID-19 declaration under Section 564(b)(1) of the Act, 21 U.S.C. section 360bbb-3(b)(1), unless the authorization is terminated or revoked.  Performed at [facility], [address], [place], [place] [address]   Urine Culture     Status: Abnormal   Collection Time: 11/23/20  7:56 PM   Specimen: Urine, Catheterized  Result Value Ref Range Status   Specimen Description   Final    URINE, CATHETERIZED Performed at [facility], [address] [place], [place] [address]    Special Requests   Final    NONE Performed at [facility], [address], [place], [place] [address]    Culture >=100,000 COLONIES/mL ESCHERICHIA COLI (A)  Final   Report Status 11/26/2020 FINAL  Final   Organism ID, Bacteria ESCHERICHIA COLI (A)  Final      Susceptibility   Escherichia coli - MIC*    AMPICILLIN 4 SENSITIVE Sensitive     CEFAZOLIN <=4 SENSITIVE Sensitive     CEFEPIME <=0.12 SENSITIVE Sensitive     CEFTRIAXONE <=0.25 SENSITIVE Sensitive     CIPROFLOXACIN <=0.25  SENSITIVE Sensitive     GENTAMICIN <=1 SENSITIVE Sensitive     IMIPENEM <=0.25 SENSITIVE Sensitive     NITROFURANTOIN <=16 SENSITIVE Sensitive     TRIMETH/SULFA <=20 SENSITIVE Sensitive     AMPICILLIN/SULBACTAM <=2 SENSITIVE Sensitive     PIP/TAZO <=4 SENSITIVE Sensitive     * >=100,000 COLONIES/mL ESCHERICHIA COLI  MRSA Next Gen by PCR, Nasal     Status: None   Collection Time: 11/25/20  5:47 AM   Specimen: Nasal Mucosa; Nasal Swab  Result Value Ref Range Status   MRSA by PCR Next Gen NOT DETECTED NOT DETECTED Final    Comment: (NOTE) The GeneXpert MRSA Assay (FDA approved for NASAL specimens only), is one component of a comprehensive MRSA colonization surveillance program. It is not intended to diagnose MRSA infection nor to guide or monitor treatment for MRSA infections. Test performance is not FDA approved in patients less than 33 years old. Performed at [facility], [address] [place], [place] [address]     Radiology Reports CT HEAD WO CONTRAST (5MM)  Result Date: 11/23/2020 CLINICAL DATA:  Delirium, hallucinations EXAM: CT HEAD WITHOUT CONTRAST TECHNIQUE: Contiguous axial images were obtained from the base of the skull through the vertex without intravenous contrast. COMPARISON:  06/27/2020 FINDINGS: Brain: No acute infarct or hemorrhage. Stable small chronic right occipital cortical infarct. Lateral ventricles and midline structures are unremarkable. There are no acute extra-axial fluid collections. There is no mass effect. Vascular: No hyperdense vessel or unexpected calcification. Skull: Normal. Negative for fracture or focal lesion. Sinuses/Orbits: No acute finding. Other: None. IMPRESSION: 1. Stable head CT.  No acute intracranial process. Electronically Signed   By: Randa Ngo M.D.   On: 11/23/2020 17:56   DG Chest Port 1 View  Result Date: 11/23/2020 CLINICAL DATA:  Weakness, no dialysis for 13 days EXAM: PORTABLE CHEST 1 VIEW COMPARISON:  Chest radiograph  10/16/2020 FINDINGS: A right chest wall dialysis catheter is in stable position. The cardiomediastinal silhouette is stable. There  is unchanged asymmetric elevation of the right hemidiaphragm. Aeration of the right base is improved compared to the prior study. There is no focal consolidation or pulmonary edema on the current study. There is no pleural effusion or pneumothorax. Right upper quadrant and right axillary surgical clips are noted. There is no acute osseous abnormality. IMPRESSION: Improved aeration of the right base compared to 10/16/2020. No radiographic evidence of acute cardiopulmonary process currently. Electronically Signed   By: Valetta Mole M.D.   On: 11/23/2020 15:44     CBC Recent Labs  Lab 11/23/20 1554 11/24/20 0620 11/25/20 0415 11/27/20 0522 11/28/20 1054  WBC 7.8 7.3 6.0 6.8 10.1  HGB 11.1* 10.6* 9.7* 9.6* 8.8*  HCT 37.1* 34.7* 30.6* 31.1* 29.3*  PLT 402* 401* 386 347 328  MCV 103.1* 103.0* 99.4 99.4 100.7*  MCH 30.8 31.5 31.5 30.7 30.2  MCHC 29.9* 30.5 31.7 30.9 30.0  RDW 16.5* 16.4* 16.0* 15.9* 15.8*  LYMPHSABS 0.4*  --   --   --   --   MONOABS 0.6  --   --   --   --   EOSABS 0.1  --   --   --   --   BASOSABS 0.0  --   --   --   --     Chemistries  Recent Labs  Lab 11/23/20 1554 11/24/20 0620 11/25/20 0415 11/27/20 0522 11/28/20 1054  NA 136 140 135 137 138  K 5.5* 5.3* 3.1* 2.7* 3.9  CL 114* 113* 104 101 105  CO2 11* 13* 22 26 23   GLUCOSE 90 84 101* 147* 101*  BUN 72* 77* 57* 43* 50*  CREATININE 6.79* 6.90* 4.77* 4.58* 5.30*  CALCIUM 8.7* 9.0 8.2* 8.2* 8.2*  AST 13*  --   --   --   --   ALT 16  --   --   --   --   ALKPHOS 166*  --   --   --   --   BILITOT 0.8  --   --   --   --     Lab Results  Component Value Date   HGBA1C 5.4 10/18/2019   ------------------------------------------------------------------------------------------------------------------ No results for input(s): TSH, T4TOTAL, T3FREE, THYROIDAB in the last 72  hours.  Invalid input(s): FREET3 ------------------------------------------------------------------------------------------------------------------ Recent Labs    11/27/20 0522  FERRITIN 382*  TIBC 217*  IRON 42*    Coagulation profile Recent Labs  Lab 11/23/20 1554  INR 1.2    ------------------------------------------------------------------------------------------------------------------ No results found for: BNP  Barton Dubois M.D on 11/28/2020 at 5:44 PM  Go to [email or web address] - for contact info  [facility] - Office  [phone]

## 2020-11-28 NOTE — Progress Notes (Signed)
Admit: 11/23/2020 LOS: 5  43M ESRD [facility] TTS with FTT, nonadherence to HD  Subjective:  Palliative care yesterday again explored GOC -- cont HD [facility] SW exploring ALF options with patient per report; was supposed to conference with pt and family yesterday but may be today instead UOP yest 1.6L, ostomy 0.5L  For HD this AM   08/29 0701 - 08/30 0700 In: -  Out: 2100 [Urine:1600; Stool:500]  Filed Weights   11/23/20 1416 11/23/20 2156  Weight: 64 kg 49.3 kg    Scheduled Meds:  (feeding supplement) PROSource Plus  30 mL Oral TID BM   aspirin EC  81 mg Oral Q breakfast   atorvastatin  10 mg Oral Daily   calcium carbonate  1 tablet Oral TID WC   cephALEXin  250 mg Oral Q12H   Chlorhexidine Gluconate Cloth  6 each Topical Daily   darbepoetin (ARANESP) injection - DIALYSIS  60 mcg Intravenous Q Tue-HD   DULoxetine  30 mg Oral Daily   gabapentin  100 mg Oral BID   heparin  5,000 Units Subcutaneous Q8H   midodrine  10 mg Oral TID   pantoprazole  40 mg Oral Daily   prazosin  1 mg Oral QHS   Continuous Infusions: PRN Meds:.acetaminophen **OR** acetaminophen, hydrOXYzine, oxyCODONE-acetaminophen, polyethylene glycol  Current Labs: reviewed    Physical Exam:  Blood pressure (!) 101/59, pulse 68, temperature (!) 97 F (36.1 C), resp. rate 17, height 5\' 8"  (1.727 m), weight 49.3 kg, SpO2 97 %. 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 Morphine should not be used.  Baclofen should be avoided Avoid oral sodium phosphate and magnesium citrate based laxatives / bowel preps    Jannifer Hick MD [facility] Pager [phone]   Recent Labs  Lab 11/24/20 [phone] 11/25/20 0415 11/27/20 0522  NA 140 135 137  K 5.3* 3.1* 2.7*  CL 113* 104 101  CO2 13* 22 26  GLUCOSE 84 101* 147*  BUN 77* 57* 43*  CREATININE 6.90* 4.77* 4.58*  CALCIUM 9.0 8.2* 8.2*  PHOS  --  3.8 4.3    Recent Labs  Lab 11/23/20 1554 11/24/20 0620 11/25/20 0415 11/27/20 0522  WBC 7.8 7.3 6.0 6.8  NEUTROABS 6.8  --   --   --   HGB 11.1* 10.6* 9.7* 9.6*  HCT 37.1* 34.7* 30.6* 31.1*  MCV 103.1* 103.0* 99.4 99.4  PLT 402* 401* 386 347

## 2020-11-29 MED ORDER — SODIUM CHLORIDE 0.9 % IV SOLN
125.0000 mg | INTRAVENOUS | Status: DC
  Administered 2020-11-30: 125 mg via INTRAVENOUS
  Filled 2020-11-29 (×2): qty 10

## 2020-11-29 NOTE — TOC Progression Note (Signed)
Transition of Care (TOC) - Progression Note    Patient Details  Name: Chase [name] MRN: [id number] Date of Birth: 03/26/1966  Transition of Care (TOC) CM/SW Contact  Samantha  Martin, LCSWA Phone Number: 11/29/2020, 2:09 PM  Clinical Narrative:    CSW met in room with Tasha Dove, NP and pt, pts sons PJ and [name] were on a speaker phone for the meeting as well. This CSW informed pt of options if he continues dialysis or the options if he decides to stop it. Pt has decided to continue dialysis until he can no longer withstand it. At this time he would like to transition to hospice care for comfort measures. CSW to work on setting up HH services for pt. [facility] has had pt previously but state they cannot accept at this time. CSW has reached out to Linda with [facility] to send referral, CSW to follow on if they can accept. TOC to follow.     Barriers to Discharge: Barriers Resolved  Expected Discharge Plan and Services                                                 Social Determinants of Health (SDOH) Interventions    Readmission Risk Interventions Readmission Risk Prevention Plan 10/24/2020 07/04/2020 12/25/2019  Transportation Screening Complete Complete Complete  Medication Review (RN Care Manager) Complete Complete Complete  PCP or Specialist appointment within 3-5 days of discharge Complete Complete -  HRI or Home Care Consult Complete Complete -  SW Recovery Care/Counseling Consult Complete Complete -  Palliative Care Screening Complete Not Applicable -  Skilled Nursing Facility Patient Refused Not Applicable -  Some recent data might be hidden    

## 2020-11-29 NOTE — Progress Notes (Signed)
Patient Demographics:    Chase [name], is a 55 y.o. male, DOB - 1965-07-14, NIO:[id number]  Admit date - 11/23/2020   Admitting Physician Bethena Roys, MD  Outpatient Primary MD for the patient is [facility], [name]  LOS - 6   Chief Complaint  Patient presents with   Failure To Thrive        Subjective:    Chase [name] no CP, no nausea, no vomiting and no SOB. Competent, oriented X 3 and with mentation back to baseline.    Assessment  & Plan :    Principal Problem:   Metabolic encephalopathy Active Problems:   Chronic pain   BPH (benign prostatic hyperplasia)   History of CVA (cerebrovascular accident)   DNR (do not resuscitate) discussion   ESRD (end stage renal disease) ([place])   Complicated UTI (urinary tract infection)   Malnutrition of moderate degree  Brief Summary:- 55 y.o. male with medical history significant for ESRD, CVA, Pseudomonas UTI, GI bleed, Hodgkin's lymphoma, chronic pain admitted on 11/22/2020 with acute metabolic encephalopathy in the setting of missed hemodialysis sessions (did Not have HD for 2 wks PTA).  A/p 1)Acute metabolic encephalopathy--due to missed HD sessions -Patient had  serial/back-to-back HD sessions on 11/24/2020 on 11/25/2020 Mentation appears back to baseline after back-to-back hemodialysis sessions. -Patient remains oriented x3 currently and coherent. -Well tolerated hemodialysis on 11/28/20  2)Social/Ethics----discussed plan of care and advanced directives with patient, patient's son [name] from [place] and patient's son [name] from [place]--- -Patient request DNR/DNI status. -he states that he would like to continue with hemodialysis and return home if possible. -Official palliative care consult appreciated; will follow recommendations for goals of care and advance directive. -Still undecided next step into action for his care given  lack of social support.  3) complicated CAUTI--previously grew Pseudomonas as well as pansensitive E. Coli -Repeat urine culture from 11/23/2020 with pansensitive E. Coli -Continue treatment with Keflex; IV antibiotic has been discontinued.  4)ESRD--missed about 2 weeks of HD sessions, -Discussed with nephrology---- Patient had  serial/back-to-back HD sessions on 11/24/2020 on 11/25/2020 -Further HD per nephrology team -Completed dialysis on 11/17/27 without complications  5)BPH with LUTs----chronic indwelling Foley due to chronic urinary retention, changed 11/23/2020  6)H/o CVA--mild residual left-sided hemiparesis  --Continue aspirin and Lipitor for secondary stroke prophylaxis  7) chronic pain syndrome/PTSD/anxiety--- Cymbalta, gabapentin , prazosin and Percocet as prescribed -Continue as needed hydroxyzine  8) anemia of ESRD----H&H stable, ESA/Epogen per nephrology team -No overt bleeding appreciated -Continue to follow hemoglobin trend intermittently.  9) generalized weakness and deconditioning and disposition---- he states that he would like to continue with hemodialysis and go home; concerns of lack of social support and currently capacity to perform all his activities of daily living independently. -Pending physical therapy evaluation. -Patient has been talking to his social worker at the [place] to see if he can move into ALF; unfortunately no assisted living from [place] available at this time. -Patient has declined PT and expressed that he is able to walk and complete his ADL's. He is planning to go home after HD tomorrow and pursuit [facility] services by [place].  Disposition/Need for in-Hospital Stay- patient will be able to go home after HD on 11/30/20; he will orchestrate through  [facility] transportation for his outpatient HD on Saturday 12/02/20.  Status is: Inpatient  Remains inpatient appropriate because: Please see disposition above  Disposition: The patient is from: Home              Anticipated d/c  is to: Home              Anticipated d/c date is: 11/30/20              Patient currently is not medically stable to d/c.  Barriers: Not Clinically Stable-   Code Status :  -  Code Status: DNR   Family Communication:    (patient is alert, awake and coherent); no family at bedside. -patient's son [name] from [place] and patient's son [name] from [place]; anticipating conjunctive meeting with patient and both sons to decide discharge pathway.  Consults  :  Nephrology/Palliative  DVT Prophylaxis  :   - SCDs  heparin injection 5,000 Units Start: 11/23/20 2200    Lab Results  Component Value Date   PLT 328 11/28/2020    Inpatient Medications  Scheduled Meds:  (feeding supplement) PROSource Plus  30 mL Oral TID BM   aspirin EC  81 mg Oral Q breakfast   atorvastatin  10 mg Oral Daily   calcium carbonate  1 tablet Oral TID WC   cephALEXin  250 mg Oral Q12H   Chlorhexidine Gluconate Cloth  6 each Topical Daily   darbepoetin (ARANESP) injection - DIALYSIS  60 mcg Intravenous Q Tue-HD   DULoxetine  30 mg Oral Daily   gabapentin  100 mg Oral BID   heparin  5,000 Units Subcutaneous Q8H   midodrine  10 mg Oral TID   pantoprazole  40 mg Oral Daily   prazosin  1 mg Oral QHS   Continuous Infusions:  [START ON 11/30/2020] ferric gluconate (FERRLECIT) IVPB      PRN Meds:.acetaminophen **OR** acetaminophen, hydrOXYzine, oxyCODONE-acetaminophen, polyethylene glycol   Anti-infectives (From admission, onward)    Start     Dose/Rate Route Frequency Ordered Stop   11/26/20 2200  cephALEXin (KEFLEX) capsule 250 mg        250 mg Oral Every 12 hours 11/26/20 1902     11/24/20 2200  ceFEPIme (MAXIPIME) 1 g in sodium chloride 0.9 % 100 mL IVPB  Status:  Discontinued        1 g 200 mL/hr over 30 Minutes Intravenous Every 24 hours 11/24/20 1034 11/26/20 1902   11/24/20 1600  ceFEPIme (MAXIPIME) 2 g in sodium chloride 0.9 % 100 mL IVPB  Status:  Discontinued        2 g 200 mL/hr over 30  Minutes Intravenous Every M-W-F (Hemodialysis) 11/23/20 2026 11/24/20 1034   11/23/20 2030  ceFEPIme (MAXIPIME) 2 g in sodium chloride 0.9 % 100 mL IVPB        2 g 200 mL/hr over 30 Minutes Intravenous  Once 11/23/20 2022 11/23/20 2347   11/23/20 1815  cefTRIAXone (ROCEPHIN) 1 g in sodium chloride 0.9 % 100 mL IVPB        1 g 200 mL/hr over 30 Minutes Intravenous  Once 11/23/20 1814 11/23/20 1930         Objective:   Vitals:   11/28/20 1248 11/28/20 2037 11/29/20 0556 11/29/20 1505  BP: 117/66 (!) 106/56 (!) 96/57 (!) 105/48  Pulse: 63 60 66 60  Resp: 18 18 16 20   Temp: 99.1 F (37.3 C) 98.2 F (36.8 C) 99.1 F (37.3 C) 98.6 F (  37 C)  TempSrc: Oral  Oral Oral  SpO2: 100% 98% 100% 98%  Weight:      Height:        Wt Readings from Last 3 Encounters:  11/23/20 49.3 kg  10/24/20 63.6 kg  07/07/20 65.7 kg     Intake/Output Summary (Last 24 hours) at 11/29/2020 1727 Last data filed at 11/29/2020 1610 Gross per 24 hour  Intake --  Output 2225 ml  Net -2225 ml    Physical Exam General exam: Alert, awake, oriented x 3, coherent and in no distress. Patient demonstrated to be capable of making his own decisions and be competent. Respiratory system: Clear to auscultation. Respiratory effort normal; no requiring oxygen supplementation. Cardiovascular system:RRR. No rubs or gallops, no JVD. Gastrointestinal system: Abdomen is nondistended, soft and nontender. No organomegaly or masses felt. Normal bowel sounds heard. Central nervous system: Alert and oriented. No focal neurological deficits. Extremities: No cyanosis, no clubbing. Right IJ HD catheter in place.  Skin/GU: No petechiae. Chronic foley in place. Psychiatry: Judgement and insight appear normal. Mood & affect appropriate.      Data Review:   Micro Results Recent Results (from the past 240 hour(s))  Blood culture (routine single)     Status: None   Collection Time: 11/23/20  3:54 PM   Specimen: BLOOD  Result  Value Ref Range Status   Specimen Description BLOOD RIGHT ANTECUBITAL  Final   Special Requests   Final    BOTTLES DRAWN AEROBIC AND ANAEROBIC Blood Culture adequate volume   Culture   Final    NO GROWTH 5 DAYS Performed at [facility], [address], [place], [place] [address]    Report Status 11/28/2020 FINAL  Final  Resp Panel by RT-PCR (Flu A&B, Covid) Nasopharyngeal Swab     Status: None   Collection Time: 11/23/20  3:59 PM   Specimen: Nasopharyngeal Swab; Nasopharyngeal(NP) swabs in vial transport medium  Result Value Ref Range Status   SARS Coronavirus 2 by RT PCR NEGATIVE NEGATIVE Final    Comment: (NOTE) SARS-CoV-2 target nucleic acids are NOT DETECTED.  The SARS-CoV-2 RNA is generally detectable in upper respiratory specimens during the acute phase of infection. The lowest concentration of SARS-CoV-2 viral copies this assay can detect is 138 copies/mL. A negative result does not preclude SARS-Cov-2 infection and should not be used as the sole basis for treatment or other patient management decisions. A negative result may occur with  improper specimen collection/handling, submission of specimen other than nasopharyngeal swab, presence of viral mutation(s) within the areas targeted by this assay, and inadequate number of viral copies(<138 copies/mL). A negative result must be combined with clinical observations, patient history, and epidemiological information. The expected result is Negative.  Fact Sheet for Patients:  EntrepreneurPulse.com.au  Fact Sheet for Healthcare Providers:  IncredibleEmployment.be  This test is no t yet approved or cleared by the [place] FDA and  has been authorized for detection and/or diagnosis of SARS-CoV-2 by FDA under an Emergency Use Authorization (EUA). This EUA will remain  in effect (meaning this test can be used) for the duration of the COVID-19 declaration under Section 564(b)(1) of the  Act, 21 U.S.C.section 360bbb-3(b)(1), unless the authorization is terminated  or revoked sooner.       Influenza A by PCR NEGATIVE NEGATIVE Final   Influenza B by PCR NEGATIVE NEGATIVE Final    Comment: (NOTE) The Xpert Xpress SARS-CoV-2/FLU/RSV plus assay is intended as an aid in the diagnosis of influenza from  Nasopharyngeal swab specimens and should not be used as a sole basis for treatment. Nasal washings and aspirates are unacceptable for Xpert Xpress SARS-CoV-2/FLU/RSV testing.  Fact Sheet for Patients: EntrepreneurPulse.com.au  Fact Sheet for Healthcare Providers: IncredibleEmployment.be  This test is not yet approved or cleared by the [place] FDA and has been authorized for detection and/or diagnosis of SARS-CoV-2 by FDA under an Emergency Use Authorization (EUA). This EUA will remain in effect (meaning this test can be used) for the duration of the COVID-19 declaration under Section 564(b)(1) of the Act, 21 U.S.C. section 360bbb-3(b)(1), unless the authorization is terminated or revoked.  Performed at [facility], [address] [place], [place] [address]   Urine Culture     Status: Abnormal   Collection Time: 11/23/20  7:56 PM   Specimen: Urine, Catheterized  Result Value Ref Range Status   Specimen Description   Final    URINE, CATHETERIZED Performed at [facility], [address], [place], [place] [address]    Special Requests   Final    NONE Performed at [facility], [address] [place], [place] [address]    Culture >=100,000 COLONIES/mL ESCHERICHIA COLI (A)  Final   Report Status 11/26/2020 FINAL  Final   Organism ID, Bacteria ESCHERICHIA COLI (A)  Final      Susceptibility   Escherichia coli - MIC*    AMPICILLIN 4 SENSITIVE Sensitive     CEFAZOLIN <=4 SENSITIVE Sensitive     CEFEPIME <=0.12 SENSITIVE Sensitive     CEFTRIAXONE <=0.25 SENSITIVE Sensitive     CIPROFLOXACIN <=0.25 SENSITIVE Sensitive      GENTAMICIN <=1 SENSITIVE Sensitive     IMIPENEM <=0.25 SENSITIVE Sensitive     NITROFURANTOIN <=16 SENSITIVE Sensitive     TRIMETH/SULFA <=20 SENSITIVE Sensitive     AMPICILLIN/SULBACTAM <=2 SENSITIVE Sensitive     PIP/TAZO <=4 SENSITIVE Sensitive     * >=100,000 COLONIES/mL ESCHERICHIA COLI  MRSA Next Gen by PCR, Nasal     Status: None   Collection Time: 11/25/20  5:47 AM   Specimen: Nasal Mucosa; Nasal Swab  Result Value Ref Range Status   MRSA by PCR Next Gen NOT DETECTED NOT DETECTED Final    Comment: (NOTE) The GeneXpert MRSA Assay (FDA approved for NASAL specimens only), is one component of a comprehensive MRSA colonization surveillance program. It is not intended to diagnose MRSA infection nor to guide or monitor treatment for MRSA infections. Test performance is not FDA approved in patients less than 56 years old. Performed at [facility], [address], [place],  [address]    Radiology Reports CT HEAD WO CONTRAST (5MM)  Result Date: 11/23/2020 CLINICAL DATA:  Delirium, hallucinations EXAM: CT HEAD WITHOUT CONTRAST TECHNIQUE: Contiguous axial images were obtained from the base of the skull through the vertex without intravenous contrast. COMPARISON:  06/27/2020 FINDINGS: Brain: No acute infarct or hemorrhage. Stable small chronic right occipital cortical infarct. Lateral ventricles and midline structures are unremarkable. There are no acute extra-axial fluid collections. There is no mass effect. Vascular: No hyperdense vessel or unexpected calcification. Skull: Normal. Negative for fracture or focal lesion. Sinuses/Orbits: No acute finding. Other: None. IMPRESSION: 1. Stable head CT.  No acute intracranial process. Electronically Signed   By: Randa Ngo M.D.   On: 11/23/2020 17:56   DG Chest Port 1 View  Result Date: 11/23/2020 CLINICAL DATA:  Weakness, no dialysis for 13 days EXAM: PORTABLE CHEST 1 VIEW COMPARISON:  Chest radiograph 10/16/2020 FINDINGS: A right chest  wall dialysis catheter is in stable position. The cardiomediastinal silhouette is stable. There is unchanged asymmetric elevation of the right hemidiaphragm. Aeration of the right base is improved compared to the prior study. There is no focal consolidation or pulmonary edema on the current study. There is no pleural effusion or pneumothorax. Right upper quadrant and right axillary surgical clips are noted. There is no acute osseous abnormality. IMPRESSION: Improved aeration of the right base compared to 10/16/2020. No radiographic evidence of acute cardiopulmonary process currently. Electronically Signed   By: Valetta Mole M.D.   On: 11/23/2020 15:44     CBC Recent Labs  Lab 11/23/20 1554 11/24/20 0620 11/25/20 0415 11/27/20 0522 11/28/20 1054  WBC 7.8 7.3 6.0 6.8 10.1  HGB 11.1* 10.6* 9.7* 9.6* 8.8*  HCT 37.1* 34.7* 30.6* 31.1* 29.3*  PLT 402* 401* 386 347 328  MCV 103.1* 103.0* 99.4 99.4 100.7*  MCH 30.8 31.5 31.5 30.7 30.2  MCHC 29.9* 30.5 31.7 30.9 30.0  RDW 16.5* 16.4* 16.0* 15.9* 15.8*  LYMPHSABS 0.4*  --   --   --   --   MONOABS 0.6  --   --   --   --   EOSABS 0.1  --   --   --   --   BASOSABS 0.0  --   --   --   --     Chemistries  Recent Labs  Lab 11/23/20 1554 11/24/20 0620 11/25/20 0415 11/27/20 0522 11/28/20 1054  NA 136 140 135 137 138  K 5.5* 5.3* 3.1* 2.7* 3.9  CL 114* 113* 104 101 105  CO2 11* 13* 22 26 23   GLUCOSE 90 84 101* 147* 101*  BUN 72* 77* 57* 43* 50*  CREATININE 6.79* 6.90* 4.77* 4.58* 5.30*  CALCIUM 8.7* 9.0 8.2* 8.2* 8.2*  AST 13*  --   --   --   --   ALT 16  --   --   --   --   ALKPHOS 166*  --   --   --   --   BILITOT 0.8  --   --   --   --     Lab Results  Component Value Date   HGBA1C 5.4 10/18/2019    Recent Labs    11/27/20 0522  FERRITIN 382*  TIBC 217*  IRON 42*    Coagulation profile Recent Labs  Lab 11/23/20 1554  INR 1.2    Barton Dubois M.D on 11/29/2020 at 5:27 PM  Go to [email or web address] - for contact  info  [facility] - Office  [phone]

## 2020-11-29 NOTE — Progress Notes (Signed)
Admit: 11/23/2020 LOS: 6  34M ESRD [facility] TTS with FTT, nonadherence to HD  Subjective:  Feels "lifeless" - doesn't think QoL is tolerable For another family meeting, plan is sometime today No particular issues with HD yesterday, just feels drained after each treatment; no UF yesterday needed - UOP 1850   08/30 0701 - 08/31 0700 In: 240 [P.O.:240] Out: 1950 [Urine:1850; Stool:100]  Filed Weights   11/23/20 1416 11/23/20 2156  Weight: 64 kg 49.3 kg    Scheduled Meds:  (feeding supplement) PROSource Plus  30 mL Oral TID BM   aspirin EC  81 mg Oral Q breakfast   atorvastatin  10 mg Oral Daily   calcium carbonate  1 tablet Oral TID WC   cephALEXin  250 mg Oral Q12H   Chlorhexidine Gluconate Cloth  6 each Topical Daily   darbepoetin (ARANESP) injection - DIALYSIS  60 mcg Intravenous Q Tue-HD   DULoxetine  30 mg Oral Daily   gabapentin  100 mg Oral BID   heparin  5,000 Units Subcutaneous Q8H   midodrine  10 mg Oral TID   pantoprazole  40 mg Oral Daily   prazosin  1 mg Oral QHS   Continuous Infusions: PRN Meds:.acetaminophen **OR** acetaminophen, hydrOXYzine, oxyCODONE-acetaminophen, polyethylene glycol  Current Labs: reviewed    Physical Exam:  Blood pressure (!) 96/57, pulse 66, temperature 99.1 F (37.3 C), temperature source Oral, resp. rate 16, height 5\' 8"  (1.727 m), weight 49.3 kg, SpO2 100 %. 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   Will follow along - HD tomorrow if wishes to continue.  No UF needed.  Will need outpt [facility] f/u rescheduled re: AVF if continues HD - missed 8/30 appt due to inpatient ESA today with HD; give IV iron with HD  Medication Issues; Preferred narcotic agents for pain control are hydromorphone, fentanyl, and methadone. Morphine should not be used.  Baclofen should be avoided Avoid oral sodium phosphate and magnesium citrate based laxatives / bowel preps    Jannifer Hick MD [facility] Pager [phone]   Recent Labs  Lab 11/25/20 0415 11/27/20 0522 11/28/20 1054  NA 135 137 138  K 3.1* 2.7* 3.9  CL 104 101 105  CO2 22 26 23   GLUCOSE 101* 147* 101*  BUN 57* 43* 50*  CREATININE 4.77* 4.58* 5.30*  CALCIUM 8.2* 8.2* 8.2*  PHOS 3.8 4.3 3.6    Recent Labs  Lab 11/23/20 1554 11/24/20 0620 11/25/20 0415 11/27/20 0522 11/28/20 1054  WBC 7.8   < > 6.0 6.8 10.1  NEUTROABS 6.8  --   --   --   --   HGB 11.1*   < > 9.7* 9.6* 8.8*  HCT 37.1*   < > 30.6* 31.1* 29.3*  MCV 103.1*   < > 99.4 99.4 100.7*  PLT 402*   < > 386 347 328   < > = values  in this interval not displayed.

## 2020-11-29 NOTE — Progress Notes (Signed)
PT Cancellation Note  Patient Details Name: Chase [name] MRN: [id number] DOB: [birth year]   Cancelled Treatment:    Reason Eval/Treat Not Completed: Patient declined, no reason specified.  Patient declined therapy after repeated attempts and states he can walk using a walker and does not feel like getting up and will be alright when he return home - RN and MD notified.    3:36 PM, 11/29/20 Lonell Grandchild, MPT Physical Therapist with [facility] [phone] office [phone] mobile phone
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
## 2020-11-30 LAB — RENAL FUNCTION PANEL
Albumin: 2.4 g/dL — ABNORMAL LOW (ref 3.5–5.0)
Anion gap: 9 (ref 5–15)
BUN: 47 mg/dL — ABNORMAL HIGH (ref 6–20)
CO2: 25 mmol/L (ref 22–32)
Calcium: 8.2 mg/dL — ABNORMAL LOW (ref 8.9–10.3)
Chloride: 104 mmol/L (ref 98–111)
Creatinine, Ser: 4.76 mg/dL — ABNORMAL HIGH (ref 0.61–1.24)
GFR, Estimated: 13 mL/min — ABNORMAL LOW (ref >60)
Glucose, Bld: 123 mg/dL — ABNORMAL HIGH (ref 70–99)
Phosphorus: 3.3 mg/dL (ref 2.5–4.6)
Potassium: 3.7 mmol/L (ref 3.5–5.1)
Sodium: 138 mmol/L (ref 135–145)

## 2020-11-30 LAB — CBC
HCT: 28.2 % — ABNORMAL LOW (ref 39.0–52.0)
Hemoglobin: 8.7 g/dL — ABNORMAL LOW (ref 13.0–17.0)
MCH: 31.3 pg (ref 26.0–34.0)
MCHC: 30.9 g/dL (ref 30.0–36.0)
MCV: 101.4 fL — ABNORMAL HIGH (ref 80.0–100.0)
Platelets: 296 10*3/uL (ref 150–400)
RBC: 2.78 MIL/uL — ABNORMAL LOW (ref 4.22–5.81)
RDW: 15.3 % (ref 11.5–15.5)
WBC: 9 10*3/uL (ref 4.0–10.5)
nRBC: 0 % (ref 0.0–0.2)

## 2020-11-30 MED ORDER — HEPARIN SODIUM (PORCINE) 1000 UNIT/ML IJ SOLN
INTRAMUSCULAR | Status: AC
  Filled 2020-11-30: qty 4

## 2020-11-30 NOTE — TOC Transition Note (Signed)
Transition of Care [facility]) - CM/SW Discharge Note   Patient Details  Name: Chase [name] MRN: [id number] Date of Birth: [birth year]  Transition of [facility]) CM/SW Contact:  Natasha Bence, LCSW Phone Number: 11/30/2020, 4:52 PM   Clinical Narrative:    CSW notified Marjory Lies with [facility] of patients anticipated discharge today after dialysis. TOC signing off.   Final next level of care: [place] Barriers to Discharge: Continued Medical Work up   Patient Goals and CMS Choice        Discharge Placement                       Discharge Plan and Services                                     Social Determinants of Health (SDOH) Interventions     Readmission Risk Interventions Readmission Risk Prevention Plan 10/24/2020 07/04/2020 12/25/2019  Transportation Screening Complete Complete Complete  Medication Review Press photographer) Complete Complete Complete  PCP or Specialist appointment within 3-5 days of discharge Complete Complete -  [place] or Home Care Consult Complete Complete -  SW Recovery Care/Counseling Consult Complete Complete -  Palliative Care Screening Complete Not Applicable -  [place] Patient Refused Not Applicable -  Some recent data might be hidden

## 2020-11-30 NOTE — Procedures (Signed)
   HEMODIALYSIS TREATMENT NOTE:   Uneventful 3 hour heparin-free treatment completed.  Catheter dressing was changed; entry site is unremarkable.  Goal met: 1  liter removed, no interruption in UF.  Hemodynamically stable with SBP 105-113, pulse 60s.  All blood was returned.  [place], RN

## 2020-11-30 NOTE — Progress Notes (Signed)
Patient Demographics:    Chase [name], is a 55 y.o. male, DOB - [birth year], JSE:[id number]  Admit date - 11/23/2020   Admitting Physician Bethena Roys, MD  Outpatient Primary MD for the patient is [facility], [name]  LOS - 7   Chief Complaint  Patient presents with   Failure To Thrive        Subjective:    Chase [name] afebrile, no chest pain, no nausea, no vomiting.  Feeling tired after none eventful hemodialysis treatment.   Assessment  & Plan :    Principal Problem:   Metabolic encephalopathy Active Problems:   Chronic pain   BPH (benign prostatic hyperplasia)   History of CVA (cerebrovascular accident)   DNR (do not resuscitate) discussion   ESRD (end stage renal disease) ([place])   Complicated UTI (urinary tract infection)   Malnutrition of moderate degree  Brief Summary:- 55 y.o. male with medical history significant for ESRD, CVA, Pseudomonas UTI, GI bleed, Hodgkin's lymphoma, chronic pain admitted on 11/22/2020 with acute metabolic encephalopathy in the setting of missed hemodialysis sessions (did Not have HD for 2 wks PTA).  A/p 1)Acute metabolic encephalopathy--due to missed HD sessions -Patient had  serial/back-to-back HD sessions on 11/24/2020 on 11/25/2020 [place] appears back to baseline after back-to-back hemodialysis sessions. -Patient remains oriented x3 currently and coherent. -Well tolerated hemodialysis on 11/30/20  2)Social/Ethics----discussed plan of care and advanced directives with patient, patient's son Chase [name] from [place] and patient's son Chase [name] from [place]--- -Patient request DNR/DNI status. -he states that he would like to continue with hemodialysis and return home if possible. -Official palliative care consult appreciated; will follow recommendations for goals of care and advance directive. -Still undecided next step into action for his care  given lack of social support.  3) complicated CAUTI--previously grew Pseudomonas as well as pansensitive E. Coli -Repeat urine culture from 11/23/2020 with pansensitive E. Coli -Continue treatment with Keflex; IV antibiotic has been discontinued.  4)ESRD--missed about 2 weeks of HD sessions, -Discussed with nephrology---- Patient had  serial/back-to-back HD sessions on 11/24/2020 on 11/25/2020 -Further HD per nephrology team -Completed dialysis on 0/7/37 without complications  5)BPH with LUTs----chronic indwelling Foley due to chronic urinary retention, changed 11/23/2020  6)H/o CVA--mild residual left-sided hemiparesis  -Continue aspirin and Lipitor for secondary stroke prophylaxis.  7) chronic pain syndrome/PTSD/anxiety--- Cymbalta, gabapentin , prazosin and Percocet as prescribed -Continue as needed hydroxyzine  8) anemia of ESRD -H&H stable, ESA/Epogen per nephrology team -No overt bleeding appreciated -Continue to follow hemoglobin trend intermittently.  9) generalized weakness and deconditioning and disposition---- he states that he would like to continue with hemodialysis and go home; concerns of lack of social support and currently capacity to perform all his activities of daily living independently. -Pending physical therapy evaluation. -Patient has been talking to his social worker at the [place] to see if he can move into ALF; unfortunately no assisted living from [place] available at this time. -Patient has declined PT and expressed that he is able to walk and complete his ADL's. He is planning to go home in am; unable to get into his house today. -tolerated HD without problem.  -[place] services by [facility].  Disposition/Need for in-Hospital Stay- patient will be able to go home after HD  on 11/30/20; he will orchestrate through [facility] transportation for his outpatient HD on Saturday 12/02/20.  Status is: Inpatient  Remains inpatient appropriate because: Please see disposition above  Disposition: The  patient is from: Home              Anticipated d/c is to: Home              Anticipated d/c date is: 12/01/20              Patient currently medically stable for discharge; only barrier is that the persons that carried the keys of his house are not available to help in getting back in and accommodate after discharge until 12/01/2020.  Barriers: Not Clinically Stable-   Code Status :  -  Code Status: DNR   Family Communication:    (patient is alert, awake and coherent); no family at bedside. -patient's son Chase [name] from [place] and patient's son Chase [name] from [place]; anticipating conjunctive meeting with patient and both sons to decide discharge pathway.  Consults  :  Nephrology/Palliative  DVT Prophylaxis  :   - SCDs  heparin injection 5,000 Units Start: 11/23/20 2200    Lab Results  Component Value Date   PLT 296 11/30/2020    Inpatient Medications  Scheduled Meds:  (feeding supplement) PROSource Plus  30 mL Oral TID BM   aspirin EC  81 mg Oral Q breakfast   atorvastatin  10 mg Oral Daily   calcium carbonate  1 tablet Oral TID WC   cephALEXin  250 mg Oral Q12H   Chlorhexidine Gluconate Cloth  6 each Topical Daily   darbepoetin (ARANESP) injection - DIALYSIS  60 mcg Intravenous Q Tue-HD   DULoxetine  30 mg Oral Daily   gabapentin  100 mg Oral BID   heparin  5,000 Units Subcutaneous Q8H   heparin sodium (porcine)       midodrine  10 mg Oral TID   pantoprazole  40 mg Oral Daily   prazosin  1 mg Oral QHS   Continuous Infusions:  ferric gluconate (FERRLECIT) IVPB Stopped (11/30/20 1551)    PRN Meds:.acetaminophen **OR** acetaminophen, hydrOXYzine, oxyCODONE-acetaminophen, polyethylene glycol   Anti-infectives (From admission, onward)    Start     Dose/Rate Route Frequency Ordered Stop   11/26/20 2200  cephALEXin (KEFLEX) capsule 250 mg        250 mg Oral Every 12 hours 11/26/20 1902     11/24/20 2200  ceFEPIme (MAXIPIME) 1 g in sodium chloride 0.9 % 100 mL IVPB  Status:   Discontinued        1 g 200 mL/hr over 30 Minutes Intravenous Every 24 hours 11/24/20 1034 11/26/20 1902   11/24/20 1600  ceFEPIme (MAXIPIME) 2 g in sodium chloride 0.9 % 100 mL IVPB  Status:  Discontinued        2 g 200 mL/hr over 30 Minutes Intravenous Every M-W-F (Hemodialysis) 11/23/20 2026 11/24/20 1034   11/23/20 2030  ceFEPIme (MAXIPIME) 2 g in sodium chloride 0.9 % 100 mL IVPB        2 g 200 mL/hr over 30 Minutes Intravenous  Once 11/23/20 2022 11/23/20 2347   11/23/20 1815  cefTRIAXone (ROCEPHIN) 1 g in sodium chloride 0.9 % 100 mL IVPB        1 g 200 mL/hr over 30 Minutes Intravenous  Once 11/23/20 1814 11/23/20 1930         Objective:   Vitals:   11/30/20 1200 11/30/20 1230 11/30/20 1300  11/30/20 1400  BP: 107/64 106/66 109/63 107/61  Pulse: 63 65 66 60  Resp:      Temp:      TempSrc:      SpO2:      Weight:      Height:        Wt Readings from Last 3 Encounters:  11/23/20 49.3 kg  10/24/20 63.6 kg  07/07/20 65.7 kg     Intake/Output Summary (Last 24 hours) at 11/30/2020 1721 Last data filed at 11/30/2020 1606 Gross per 24 hour  Intake 353.81 ml  Output 800 ml  Net -446.19 ml    Physical Exam General exam: Alert, awake, oriented x 3, feeling tired.  In no major distress. Respiratory system: Clear to auscultation. Respiratory effort normal.  No requiring oxygen supplementation.  Good saturation on room air. Cardiovascular system:RRR. No murmurs, rubs, gallops.  No JVD. Gastrointestinal system: Abdomen is nondistended, soft and nontender. No organomegaly or masses felt. Normal bowel sounds heard. Central nervous system: Alert and oriented. No focal neurological deficits. Extremities: No cyanosis or clubbing.  Right IJ HD catheter in place Skin: No petechiae. Psychiatry: Judgement and insight appear normal. Mood & affect appropriate.      Data Review:   Micro Results Recent Results (from the past 240 hour(s))  Blood culture (routine single)      Status: None   Collection Time: 11/23/20  3:54 PM   Specimen: BLOOD  Result Value Ref Range Status   Specimen Description BLOOD RIGHT ANTECUBITAL  Final   Special Requests   Final    BOTTLES DRAWN AEROBIC AND ANAEROBIC Blood Culture adequate volume   Culture   Final    NO GROWTH 5 DAYS Performed at [facility], [address], [place], [place] [address]    Report Status 11/28/2020 FINAL  Final  Resp Panel by RT-PCR (Flu A&B, Covid) Nasopharyngeal Swab     Status: None   Collection Time: 11/23/20  3:59 PM   Specimen: Nasopharyngeal Swab; Nasopharyngeal(NP) swabs in vial transport medium  Result Value Ref Range Status   SARS Coronavirus 2 by RT PCR NEGATIVE NEGATIVE Final    Comment: (NOTE) SARS-CoV-2 target nucleic acids are NOT DETECTED.  The SARS-CoV-2 RNA is generally detectable in upper respiratory specimens during the acute phase of infection. The lowest concentration of SARS-CoV-2 viral copies this assay can detect is 138 copies/mL. A negative result does not preclude SARS-Cov-2 infection and should not be used as the sole basis for treatment or other patient management decisions. A negative result may occur with  improper specimen collection/handling, submission of specimen other than nasopharyngeal swab, presence of viral mutation(s) within the areas targeted by this assay, and inadequate number of viral copies(<138 copies/mL). A negative result must be combined with clinical observations, patient history, and epidemiological information. The expected result is Negative.  Fact Sheet for Patients:  EntrepreneurPulse.com.au  Fact Sheet for Healthcare Providers:  IncredibleEmployment.be  This test is no t yet approved or cleared by the [place] FDA and  has been authorized for detection and/or diagnosis of SARS-CoV-2 by FDA under an Emergency Use Authorization (EUA). This EUA will remain  in effect (meaning this test can be used)  for the duration of the COVID-19 declaration under Section 564(b)(1) of the Act, 21 U.S.C.section 360bbb-3(b)(1), unless the authorization is terminated  or revoked sooner.       Influenza A by PCR NEGATIVE NEGATIVE Final   Influenza B by PCR NEGATIVE NEGATIVE Final    Comment: (  NOTE) The Xpert Xpress SARS-CoV-2/FLU/RSV plus assay is intended as an aid in the diagnosis of influenza from Nasopharyngeal swab specimens and should not be used as a sole basis for treatment. Nasal washings and aspirates are unacceptable for Xpert Xpress SARS-CoV-2/FLU/RSV testing.  Fact Sheet for Patients: EntrepreneurPulse.com.au  Fact Sheet for Healthcare Providers: IncredibleEmployment.be  This test is not yet approved or cleared by the [place] FDA and has been authorized for detection and/or diagnosis of SARS-CoV-2 by FDA under an Emergency Use Authorization (EUA). This EUA will remain in effect (meaning this test can be used) for the duration of the COVID-19 declaration under Section 564(b)(1) of the Act, 21 U.S.C. section 360bbb-3(b)(1), unless the authorization is terminated or revoked.  Performed at [facility], [address] [place], [place] [address]   Urine Culture     Status: Abnormal   Collection Time: 11/23/20  7:56 PM   Specimen: Urine, Catheterized  Result Value Ref Range Status   Specimen Description   Final    URINE, CATHETERIZED Performed at [facility], [address] [place], [place] [address]    Special Requests   Final    NONE Performed at [facility], [address], [place], [place] [address]    Culture >=100,000 COLONIES/mL ESCHERICHIA COLI (A)  Final   Report Status 11/26/2020 FINAL  Final   Organism ID, Bacteria ESCHERICHIA COLI (A)  Final      Susceptibility   Escherichia coli - MIC*    AMPICILLIN 4 SENSITIVE Sensitive     CEFAZOLIN <=4 SENSITIVE Sensitive     CEFEPIME <=0.12 SENSITIVE Sensitive     CEFTRIAXONE  <=0.25 SENSITIVE Sensitive     CIPROFLOXACIN <=0.25 SENSITIVE Sensitive     GENTAMICIN <=1 SENSITIVE Sensitive     IMIPENEM <=0.25 SENSITIVE Sensitive     NITROFURANTOIN <=16 SENSITIVE Sensitive     TRIMETH/SULFA <=20 SENSITIVE Sensitive     AMPICILLIN/SULBACTAM <=2 SENSITIVE Sensitive     PIP/TAZO <=4 SENSITIVE Sensitive     * >=100,000 COLONIES/mL ESCHERICHIA COLI  MRSA Next Gen by PCR, Nasal     Status: None   Collection Time: 11/25/20  5:47 AM   Specimen: Nasal Mucosa; Nasal Swab  Result Value Ref Range Status   MRSA by PCR Next Gen NOT DETECTED NOT DETECTED Final    Comment: (NOTE) The GeneXpert MRSA Assay (FDA approved for NASAL specimens only), is one component of a comprehensive MRSA colonization surveillance program. It is not intended to diagnose MRSA infection nor to guide or monitor treatment for MRSA infections. Test performance is not FDA approved in patients less than 68 years old. Performed at [facility], [address] [place], [place] [address]    Radiology Reports CT HEAD WO CONTRAST (5MM)  Result Date: 11/23/2020 CLINICAL DATA:  Delirium, hallucinations EXAM: CT HEAD WITHOUT CONTRAST TECHNIQUE: Contiguous axial images were obtained from the base of the skull through the vertex without intravenous contrast. COMPARISON:  06/27/2020 FINDINGS: Brain: No acute infarct or hemorrhage. Stable small chronic right occipital cortical infarct. Lateral ventricles and midline structures are unremarkable. There are no acute extra-axial fluid collections. There is no mass effect. Vascular: No hyperdense vessel or unexpected calcification. Skull: Normal. Negative for fracture or focal lesion. Sinuses/Orbits: No acute finding. Other: None. IMPRESSION: 1. Stable head CT.  No acute intracranial process. Electronically Signed   By: Randa Ngo M.D.   On: 11/23/2020 17:56   DG Chest Port 1 View  Result Date: 11/23/2020 CLINICAL DATA:  Weakness, no  dialysis for 13 days EXAM: PORTABLE  CHEST 1 VIEW COMPARISON:  Chest radiograph 10/16/2020 FINDINGS: A right chest wall dialysis catheter is in stable position. The cardiomediastinal silhouette is stable. There is unchanged asymmetric elevation of the right hemidiaphragm. Aeration of the right base is improved compared to the prior study. There is no focal consolidation or pulmonary edema on the current study. There is no pleural effusion or pneumothorax. Right upper quadrant and right axillary surgical clips are noted. There is no acute osseous abnormality. IMPRESSION: Improved aeration of the right base compared to 10/16/2020. No radiographic evidence of acute cardiopulmonary process currently. Electronically Signed   By: Valetta Mole M.D.   On: 11/23/2020 15:44     CBC Recent Labs  Lab 11/24/20 0620 11/25/20 0415 11/27/20 0522 11/28/20 1054 11/30/20 1231  WBC 7.3 6.0 6.8 10.1 9.0  HGB 10.6* 9.7* 9.6* 8.8* 8.7*  HCT 34.7* 30.6* 31.1* 29.3* 28.2*  PLT 401* 386 347 328 296  MCV 103.0* 99.4 99.4 100.7* 101.4*  MCH 31.5 31.5 30.7 30.2 31.3  MCHC 30.5 31.7 30.9 30.0 30.9  RDW 16.4* 16.0* 15.9* 15.8* 15.3    Chemistries  Recent Labs  Lab 11/24/20 0620 11/25/20 0415 11/27/20 0522 11/28/20 1054 11/30/20 1231  NA 140 135 137 138 138  K 5.3* 3.1* 2.7* 3.9 3.7  CL 113* 104 101 105 104  CO2 13* 22 26 23 25   GLUCOSE 84 101* 147* 101* 123*  BUN 77* 57* 43* 50* 47*  CREATININE 6.90* 4.77* 4.58* 5.30* 4.76*  CALCIUM 9.0 8.2* 8.2* 8.2* 8.2*    Lab Results  Component Value Date   HGBA1C 5.4 10/18/2019    No results for input(s): VITAMINB12, FOLATE, FERRITIN, TIBC, IRON, RETICCTPCT in the last 72 hours.   Coagulation profile No results for input(s): INR, PROTIME in the last 168 hours.   Barton Dubois M.D on 11/30/2020 at 5:21 PM  Go to [email or web address] - for contact info  [facility] - Office  [phone]

## 2020-11-30 NOTE — Progress Notes (Signed)
Palliative: Chase [name] has elected to continue dialysis as long as he is able.  He will be discharging to his own home after today's HD session.  Transition of care team is working with Chase [name] for at home needs.  He and his sons are to continue working with his [employer], [employer].  Conference with attending, bedside nursing staff, transition of care team related to patient condition, needs, goals of care, disposition. [place]/DNR form completed and placed on chart.  Plan: Continue to treat the treatable but no CPR or intubation.  Continue with hemodialysis as long as he sees fit.  If/when he becomes acutely ill again, he request that his sons focus on comfort and dignity, hospice care.  60 minutes Chase Axe, NP Palliative medicine team Team phone [phone] Greater than 50% of this time was spent counseling and coordinating care related to the above assessment and plan.

## 2020-11-30 NOTE — Progress Notes (Signed)
Admit: 11/23/2020 LOS: 7  29M ESRD [facility] TTS with FTT, nonadherence to HD  Subjective:  Wishes to cont dialysis - next treatment today Appears plan is to d/c today post HD   08/31 0701 - 09/01 0700 In: -  Out: 1225 [Urine:1025; Stool:200]  Filed Weights   11/23/20 1416 11/23/20 2156  Weight: 64 kg 49.3 kg    Scheduled Meds:  (feeding supplement) PROSource Plus  30 mL Oral TID BM   aspirin EC  81 mg Oral Q breakfast   atorvastatin  10 mg Oral Daily   calcium carbonate  1 tablet Oral TID WC   cephALEXin  250 mg Oral Q12H   Chlorhexidine Gluconate Cloth  6 each Topical Daily   darbepoetin (ARANESP) injection - DIALYSIS  60 mcg Intravenous Q Tue-HD   DULoxetine  30 mg Oral Daily   gabapentin  100 mg Oral BID   heparin  5,000 Units Subcutaneous Q8H   midodrine  10 mg Oral TID   pantoprazole  40 mg Oral Daily   prazosin  1 mg Oral QHS   Continuous Infusions:  ferric gluconate (FERRLECIT) IVPB     PRN Meds:.acetaminophen **OR** acetaminophen, hydrOXYzine, oxyCODONE-acetaminophen, polyethylene glycol  Current Labs: reviewed    Physical Exam:  Blood pressure (!) 103/51, pulse (!) 57, temperature 98.1 F (36.7 C), resp. rate 18, height 5\' 8"  (1.727 m), weight 49.3 kg, SpO2 99 %. Chronically ill appearing but NAD RRR CTAB Ostomy present No LEE TDC LUE AVF +t/b, hard swelling at incision site - no erythema or drainage; c/o pain at this site  A ESRD TTS [facility]; pt struggling with adherence; last HD 8/30 FTT / complex medical issues, pt exploring GOC Hypokalemia, likley nutritional and ostomy; repleted  Anemia, mild, Trend; 8/29 t sat 19% CKD-BMD: Ca and P at goal PCM / Hypoalbuminemia Chronic colostomy HTN/Vol: not a major issue right now  P Cont HD TTS per patient's wishes for now, appreciate palliative care input  Will follow along - HD today  No UF needed.  Will need outpt VVS f/u rescheduled re: AVF  - missed 8/30 appt due to inpatient ESA  with HD; give IV iron with HD  Medication Issues; Preferred narcotic agents for pain control are hydromorphone, fentanyl, and methadone. Morphine should not be used.  Baclofen should be avoided Avoid oral sodium phosphate and magnesium citrate based laxatives / bowel preps    Jannifer Hick MD [facility] Pager [phone]   Recent Labs  Lab 11/25/20 0415 11/27/20 0522 11/28/20 1054  NA 135 137 138  K 3.1* 2.7* 3.9  CL 104 101 105  CO2 22 26 23   GLUCOSE 101* 147* 101*  BUN 57* 43* 50*  CREATININE 4.77* 4.58* 5.30*  CALCIUM 8.2* 8.2* 8.2*  PHOS 3.8 4.3 3.6    Recent Labs  Lab 11/23/20 1554 11/24/20 0620 11/25/20 0415 11/27/20 0522 11/28/20 1054  WBC 7.8   < > 6.0 6.8 10.1  NEUTROABS 6.8  --   --   --   --   HGB 11.1*   < > 9.7* 9.6* 8.8*  HCT 37.1*   < > 30.6* 31.1* 29.3*  MCV 103.1*   < > 99.4 99.4 100.7*  PLT 402*   < > 386 347 328   < > = values in this interval not displayed.

## 2020-12-01 DIAGNOSIS — Z8673 Personal history of transient ischemic attack (TIA), and cerebral infarction without residual deficits: Secondary | ICD-10-CM

## 2020-12-01 DIAGNOSIS — Z7189 Other specified counseling: Secondary | ICD-10-CM

## 2020-12-01 DIAGNOSIS — N401 Enlarged prostate with lower urinary tract symptoms: Secondary | ICD-10-CM

## 2020-12-01 DIAGNOSIS — N138 Other obstructive and reflux uropathy: Secondary | ICD-10-CM

## 2020-12-01 DIAGNOSIS — E875 Hyperkalemia: Secondary | ICD-10-CM

## 2020-12-01 DIAGNOSIS — E440 Moderate protein-calorie malnutrition: Secondary | ICD-10-CM

## 2020-12-01 MED ORDER — OXYCODONE-ACETAMINOPHEN 7.5-325 MG PO TABS: 1.0000 | ORAL_TABLET | ORAL | 0 refills | Status: AC | PRN

## 2020-12-01 MED ORDER — ATORVASTATIN CALCIUM 10 MG PO TABS: 10.0000 mg | ORAL_TABLET | Freq: Every day | ORAL | 1 refills | Status: DC

## 2020-12-01 MED ORDER — ASPIRIN 81 MG PO TBEC: 81.0000 mg | DELAYED_RELEASE_TABLET | Freq: Every day | ORAL | 11 refills | Status: DC

## 2020-12-01 MED ORDER — CEPHALEXIN 250 MG PO CAPS: 250.0000 mg | ORAL_CAPSULE | Freq: Two times a day (BID) | ORAL | 0 refills | Status: DC

## 2020-12-01 MED ORDER — CEPHALEXIN 250 MG PO CAPS: 250.0000 mg | ORAL_CAPSULE | Freq: Two times a day (BID) | ORAL | 0 refills | Status: AC

## 2020-12-01 MED ORDER — PROSOURCE PLUS PO LIQD: 30.0000 mL | Freq: Three times a day (TID) | ORAL | 0 refills | Status: AC

## 2020-12-01 MED ORDER — PROSOURCE PLUS PO LIQD: 30.0000 mL | Freq: Three times a day (TID) | ORAL | 0 refills | Status: DC

## 2020-12-01 NOTE — Discharge Summary (Addendum)
Physician Discharge Summary  Chase [name]:[id number] DOB: 1966/02/04 DOA: 11/23/2020  PCP: [name]  Admit date: 11/23/2020 Discharge date: 12/01/2020  Time spent: 35 minutes  Recommendations for Outpatient Follow-up:  Repeat BMET to follow electrolytes Repeat CBC to follow Hgb stability Reassess patient overall stability and compliance to HD treatment and medications.  Continue outpatient follow up with urology service.    Discharge Diagnoses:  Principal Problem:   Metabolic encephalopathy Active Problems:   Chronic pain   BPH (benign prostatic hyperplasia)   History of CVA (cerebrovascular accident)   DNR (do not resuscitate) discussion   ESRD (end stage renal disease) ([place])   Complicated UTI (urinary tract infection)   Malnutrition of moderate degree   Hyperkalemia   Discharge Condition: stable and improved. Discharge home with [facility] services and instructions to follow up with PCP in 10 days.  Code status: DNR/DNI.  Diet recommendation: Renal diet with fluid restrictions to 1.8-2 L daily  Filed Weights   11/23/20 1416 11/23/20 2156  Weight: 64 kg 49.3 kg    History of present illness:  As per H&P written by Dr. Denton Brick on 11/23/20 Chase [name] is a 55 y.o. male with medical history significant for ESRD, CVA, Pseudomonas UTI, GI bleed, Hodgkin's lymphoma, chronic pain. Patient was brought to the ED reports that patient was hallucinating, and he has not been dialyzed since August 12.  At time of my evaluation, patient is awake alert oriented, is able to answer a some questions appropriately, but frequently goes off on a tangent And I unable to understand what he is saying.   ED provider obtained history for patient's son earlier, he reported that patient has been hallucinating, and he was called by the [place] that patient had not gotten dialyzed since August 12th -13 days ago. Patient tells me he did not go because initially he was sick, because people  trespassed and entered his house, got arrested for drug charges.  He also tells me that children are in his house.  He tells me left to him alone he would not go on with dialysis, that he has no quality of life, but agrees to dialysis tomorrow.  Reports compliance with his medications and that he has been eating. Patient was also covered with feces in the ED, apparently his ostomy has been pulled out.  He is unable to tell me how or why this happened. He reports intermittent difficulty breathing with exertion, but no chest pain, no leg swelling.  He reports he makes a little amount of urine.      Recent prolonged hospitalization 7/8-7/26-acute on chronic renal failure, progressed to ESRD on dialysis, also revealed: Diverticulitis with abscess formation leading to complex urologic obstruction, resulting in diverting colostomy.    Hospital Course:  1)Acute metabolic encephalopathy--due to missed HD sessions -Patient had  serial/back-to-back HD sessions on 11/24/2020 on 11/25/2020 [place] appears back to baseline after back-to-back hemodialysis sessions. -Patient remains oriented x3 currently and coherent. -Well tolerated hemodialysis on 11/30/2020; Next treatment anticipated for 12/02/2020 as an outpatient.   2)Social/Ethics----discussed plan of care and advanced directives with patient, patient's son [name] from [place] and patient's son [name] from [place] (thanks to palliative care service for assistance and help)--- -Patient request DNR/DNI status. -he states that he would like to continue with hemodialysis and return home with home health services.  3) complicated CAUTI--previously grew Pseudomonas as well as pansensitive E. Coli -Repeat urine culture from 11/23/2020 with pansensitive E. Coli -Continue treatment with  Keflex to complete antibiotic therapy -afebrile and denying dysuria -hemodynamically stable and with normal WBC's.   4)ESRD--missed about 2 weeks of HD sessions, -Discussed  with nephrology---- Patient had  serial/back-to-back HD sessions on 11/24/2020 on 11/25/2020 -Further HD per outpatient nephrology team -Completed dialysis on 12/03/71 without complications -next treatment 12/02/20   5)BPH with LUTs -chronic indwelling Foley due to chronic urinary retention, changed 11/23/2020 -continue good care of catheter and exchanged every 3-4 weeks.   6)H/o CVA -mild residual left-sided hemiparesis  -Continue aspirin and Lipitor for secondary stroke prophylaxis. -HH services arranged at discharge   7) chronic pain syndrome/PTSD/anxiety- -Cymbalta, gabapentin , prazosin and Panalgesics as previously prescribed -continue close follow up with PCP.   8) anemia of ESRD -H&H stable, ESA/Epogen per nephrology service discretion -No overt bleeding appreciated -Continue to follow hemoglobin trend intermittently. -no transfusion needed.   9) generalized weakness and deconditioning and disposition---- he states that he would like to continue with hemodialysis and go home; concerns of lack of social support and currently capacity to perform all his activities of daily living independently remains present. -Patient has been talking to his Education officer, museum at the [place] to see if he can move into ALF; unfortunately no assisted living from [place] available at this time. -Patient has declined PT and expressed that he is able to walk and will go home with [facility] without problem.  -competent, oriented X3 and in not distress.   10)stage 2 pressure injury in his sacrum  -POA -continue constant repositioning and preventive barrier measures.   Procedures: See below for x-ray reports.  Consultations: Nephrology service Palliative care  Discharge Exam: Vitals:   12/01/20 0528 12/01/20 0600  BP: (!) 92/59 (!) 92/59  Pulse: 61 61  Resp: 17 17  Temp: 99 F (37.2 C) 99 F (37.2 C)  SpO2: 98% 98%   General exam: Alert, awake, oriented x 3, feeling tired.  In no major  distress.  Afebrile, hemodynamically stable. Respiratory system: Clear to auscultation. Respiratory effort normal.  No requiring oxygen supplementation.  Good saturation on room air. Cardiovascular system:RRR. No murmurs, rubs, gallops.  No JVD. Gastrointestinal system: Abdomen is nondistended, soft and nontender. No organomegaly or masses felt. Normal bowel sounds heard. Central nervous system: Alert and oriented. No focal neurological deficits. Extremities: No cyanosis or clubbing.  Right IJ HD catheter in place Skin: No petechiae. Stage 2 pressure injury in sacrum area, POA. Psychiatry: Judgement and insight appear normal. Mood & affect appropriate.   Discharge Instructions   Discharge Instructions     Diet general   Complete by: As directed    Please limit your amount of fluids to 1.8-2L daily.   Discharge instructions   Complete by: As directed    Take medications as prescribed Please resume HD on 12/02/20 as discussed/instructed Arrange follow up with PCP in 10 days Maintain adequate hydration   Discharge wound care:   Complete by: As directed    Constant repositioning and use of barrier cream for prevention recommended. Keep area clean and dry.   Increase activity slowly   Complete by: As directed       Allergies as of 12/01/2020       Reactions   Nsaids Other (See Comments)   Bleeding GI Ulceration   Ciprofloxacin Nausea And Vomiting        Medication List     STOP taking these medications    HYDROcodone-acetaminophen 5-325 MG tablet Commonly known as: NORCO/VICODIN  TAKE these medications    (feeding supplement) PROSource Plus liquid Take 30 mLs by mouth 3 (three) times daily between meals.   acetaminophen 500 MG tablet Commonly known as: TYLENOL Take 1 tablet (500 mg total) by mouth every 6 (six) hours as needed for mild pain, fever or headache.   aspirin 81 MG EC tablet Take 1 tablet (81 mg total) by mouth daily with breakfast. Swallow  whole.   atorvastatin 10 MG tablet Commonly known as: LIPITOR Take 1 tablet (10 mg total) by mouth daily.   calcium carbonate 500 MG chewable tablet Commonly known as: TUMS - dosed in mg elemental calcium Chew 1 tablet (200 mg of elemental calcium total) by mouth 3 (three) times daily.   cephALEXin 250 MG capsule Commonly known as: KEFLEX Take 1 capsule (250 mg total) by mouth every 12 (twelve) hours for 3 days.   cyanocobalamin 1000 MCG/ML injection Commonly known as: (VITAMIN B-12) Inject 1,000 mcg into the muscle every 30 (thirty) days.   DULoxetine 30 MG capsule Commonly known as: CYMBALTA Take 1 capsule (30 mg total) by mouth 2 (two) times daily.   gabapentin 100 MG capsule Commonly known as: NEURONTIN Take 100 mg by mouth. Take 1 capsule by mouth on Mon,Wed, and Friday for nerve pain. Take after dialysis   hydrOXYzine 10 MG tablet Commonly known as: ATARAX/VISTARIL Take 1 tablet (10 mg total) by mouth 3 (three) times daily as needed for itching or anxiety.   midodrine 10 MG tablet Commonly known as: PROAMATINE Take 1 tablet (10 mg total) by mouth daily.   multivitamin with minerals Tabs tablet Take 1 tablet by mouth daily.   NARCAN NA Place 4 mg into the nose once. Spray 1 spray into one nostril as directed  ( turn person on side after dose). If no response in 2 to 3 minutes or person responds but relapes a new sprays,repeat  using  a new spray.   oxyCODONE-acetaminophen 7.5-325 MG tablet Commonly known as: PERCOCET Take 1 tablet by mouth every 4 (four) hours as needed for up to 5 days for moderate pain.   pantoprazole 20 MG tablet Commonly known as: PROTONIX TAKE 1 TABLET (20 MG TOTAL) BY MOUTH 2 (TWO) TIMES DAILY.   polycarbophil 625 MG tablet Commonly known as: FIBERCON Take 1 tablet (625 mg total) by mouth 2 (two) times daily.   polyethylene glycol 17 g packet Commonly known as: MIRALAX / GLYCOLAX Take 17 g by mouth daily as needed for moderate  constipation.   prazosin 1 MG capsule Commonly known as: MINIPRESS Take 1 mg by mouth at bedtime.   silodosin 8 MG Caps capsule Commonly known as: Rapaflo Take 1 capsule (8 mg total) by mouth daily with breakfast.   sodium bicarbonate 650 MG tablet TAKE 1 TABLET (650 MG TOTAL) BY MOUTH 2 (TWO) TIMES DAILY.   tamsulosin 0.4 MG Caps capsule Commonly known as: FLOMAX Take 0.4 mg by mouth at bedtime.   Vitamin D3 25 MCG tablet Commonly known as: Vitamin D TAKE 800 UNITS BY MOUTH DAILY.               Discharge Care Instructions  (From admission, onward)           Start     Ordered   12/01/20 0000  Discharge wound care:       Comments: Constant repositioning and use of barrier cream for prevention recommended. Keep area clean and dry.   12/01/20 7619  Allergies  Allergen Reactions   Nsaids Other (See Comments)    Bleeding GI Ulceration   Ciprofloxacin Nausea And Vomiting    Follow-up Information     [name]. Schedule an appointment as soon as possible for a visit in 10 day(s).   Contact information: [place] [address] [phone]                 The results of significant diagnostics from this hospitalization (including imaging, microbiology, ancillary and laboratory) are listed below for reference.    Significant Diagnostic Studies: CT HEAD WO CONTRAST (5MM)  Result Date: 11/23/2020 CLINICAL DATA:  Delirium, hallucinations EXAM: CT HEAD WITHOUT CONTRAST TECHNIQUE: Contiguous axial images were obtained from the base of the skull through the vertex without intravenous contrast. COMPARISON:  06/27/2020 FINDINGS: Brain: No acute infarct or hemorrhage. Stable small chronic right occipital cortical infarct. Lateral ventricles and midline structures are unremarkable. There are no acute extra-axial fluid collections. There is no mass effect. Vascular: No hyperdense vessel or unexpected  calcification. Skull: Normal. Negative for fracture or focal lesion. Sinuses/Orbits: No acute finding. Other: None. IMPRESSION: 1. Stable head CT.  No acute intracranial process. Electronically Signed   By: Randa Ngo M.D.   On: 11/23/2020 17:56   DG Chest Port 1 View  Result Date: 11/23/2020 CLINICAL DATA:  Weakness, no dialysis for 13 days EXAM: PORTABLE CHEST 1 VIEW COMPARISON:  Chest radiograph 10/16/2020 FINDINGS: A right chest wall dialysis catheter is in stable position. The cardiomediastinal silhouette is stable. There is unchanged asymmetric elevation of the right hemidiaphragm. Aeration of the right base is improved compared to the prior study. There is no focal consolidation or pulmonary edema on the current study. There is no pleural effusion or pneumothorax. Right upper quadrant and right axillary surgical clips are noted. There is no acute osseous abnormality. IMPRESSION: Improved aeration of the right base compared to 10/16/2020. No radiographic evidence of acute cardiopulmonary process currently. Electronically Signed   By: Valetta Mole M.D.   On: 11/23/2020 15:44    Microbiology: Recent Results (from the past 240 hour(s))  Blood culture (routine single)     Status: None   Collection Time: 11/23/20  3:54 PM   Specimen: BLOOD  Result Value Ref Range Status   Specimen Description BLOOD RIGHT ANTECUBITAL  Final   Special Requests   Final    BOTTLES DRAWN AEROBIC AND ANAEROBIC Blood Culture adequate volume   Culture   Final    NO GROWTH 5 DAYS Performed at [facility], [address] [place], [place] [address]    Report Status 11/28/2020 FINAL  Final  Resp Panel by RT-PCR (Flu A&B, Covid) Nasopharyngeal Swab     Status: None   Collection Time: 11/23/20  3:59 PM   Specimen: Nasopharyngeal Swab; Nasopharyngeal(NP) swabs in vial transport medium  Result Value Ref Range Status   SARS Coronavirus 2 by RT PCR NEGATIVE NEGATIVE Final    Comment: (NOTE) SARS-CoV-2 target nucleic  acids are NOT DETECTED.  The SARS-CoV-2 RNA is generally detectable in upper respiratory specimens during the acute phase of infection. The lowest concentration of SARS-CoV-2 viral copies this assay can detect is 138 copies/mL. A negative result does not preclude SARS-Cov-2 infection and should not be used as the sole basis for treatment or other patient management decisions. A negative result may occur with  improper specimen collection/handling, submission of specimen other than nasopharyngeal swab, presence of viral mutation(s) within the areas targeted by  this assay, and inadequate number of viral copies(<138 copies/mL). A negative result must be combined with clinical observations, patient history, and epidemiological information. The expected result is Negative.  Fact Sheet for Patients:  EntrepreneurPulse.com.au  Fact Sheet for Healthcare Providers:  IncredibleEmployment.be  This test is no t yet approved or cleared by the [place] FDA and  has been authorized for detection and/or diagnosis of SARS-CoV-2 by FDA under an Emergency Use Authorization (EUA). This EUA will remain  in effect (meaning this test can be used) for the duration of the COVID-19 declaration under Section 564(b)(1) of the Act, 21 U.S.C.section 360bbb-3(b)(1), unless the authorization is terminated  or revoked sooner.       Influenza A by PCR NEGATIVE NEGATIVE Final   Influenza B by PCR NEGATIVE NEGATIVE Final    Comment: (NOTE) The Xpert Xpress SARS-CoV-2/FLU/RSV plus assay is intended as an aid in the diagnosis of influenza from Nasopharyngeal swab specimens and should not be used as a sole basis for treatment. Nasal washings and aspirates are unacceptable for Xpert Xpress SARS-CoV-2/FLU/RSV testing.  Fact Sheet for Patients: EntrepreneurPulse.com.au  Fact Sheet for Healthcare Providers: IncredibleEmployment.be  This  test is not yet approved or cleared by the [place] FDA and has been authorized for detection and/or diagnosis of SARS-CoV-2 by FDA under an Emergency Use Authorization (EUA). This EUA will remain in effect (meaning this test can be used) for the duration of the COVID-19 declaration under Section 564(b)(1) of the Act, 21 U.S.C. section 360bbb-3(b)(1), unless the authorization is terminated or revoked.  Performed at [facility], [address] [place], [place] [address]   Urine Culture     Status: Abnormal   Collection Time: 11/23/20  7:56 PM   Specimen: Urine, Catheterized  Result Value Ref Range Status   Specimen Description   Final    URINE, CATHETERIZED Performed at [facility], [address], [place], [place] [address]    Special Requests   Final    NONE Performed at [facility], [address], [place], [place] [address]    Culture >=100,000 COLONIES/mL ESCHERICHIA COLI (A)  Final   Report Status 11/26/2020 FINAL  Final   Organism ID, Bacteria ESCHERICHIA COLI (A)  Final      Susceptibility   Escherichia coli - MIC*    AMPICILLIN 4 SENSITIVE Sensitive     CEFAZOLIN <=4 SENSITIVE Sensitive     CEFEPIME <=0.12 SENSITIVE Sensitive     CEFTRIAXONE <=0.25 SENSITIVE Sensitive     CIPROFLOXACIN <=0.25 SENSITIVE Sensitive     GENTAMICIN <=1 SENSITIVE Sensitive     IMIPENEM <=0.25 SENSITIVE Sensitive     NITROFURANTOIN <=16 SENSITIVE Sensitive     TRIMETH/SULFA <=20 SENSITIVE Sensitive     AMPICILLIN/SULBACTAM <=2 SENSITIVE Sensitive     PIP/TAZO <=4 SENSITIVE Sensitive     * >=100,000 COLONIES/mL ESCHERICHIA COLI  MRSA Next Gen by PCR, Nasal     Status: None   Collection Time: 11/25/20  5:47 AM   Specimen: Nasal Mucosa; Nasal Swab  Result Value Ref Range Status   MRSA by PCR Next Gen NOT DETECTED NOT DETECTED Final    Comment: (NOTE) The GeneXpert MRSA Assay (FDA approved for NASAL specimens only), is one component of a comprehensive MRSA colonization  surveillance program. It is not intended to diagnose MRSA infection nor to guide or monitor treatment for MRSA infections. Test performance is not FDA approved in patients less than 72 years old. Performed at [facility], [address],  [place], [place] [address]      Labs: Basic Metabolic Panel: Recent Labs  Lab 11/25/20 0415 11/27/20 0522 11/28/20 1054 11/30/20 1231  NA 135 137 138 138  K 3.1* 2.7* 3.9 3.7  CL 104 101 105 104  CO2 22 26 23 25   GLUCOSE 101* 147* 101* 123*  BUN 57* 43* 50* 47*  CREATININE 4.77* 4.58* 5.30* 4.76*  CALCIUM 8.2* 8.2* 8.2* 8.2*  PHOS 3.8 4.3 3.6 3.3   Liver Function Tests: Recent Labs  Lab 11/25/20 0415 11/27/20 0522 11/28/20 1054 11/30/20 1231  ALBUMIN 2.8* 2.6* 2.5* 2.4*    CBC: Recent Labs  Lab 11/25/20 0415 11/27/20 0522 11/28/20 1054 11/30/20 1231  WBC 6.0 6.8 10.1 9.0  HGB 9.7* 9.6* 8.8* 8.7*  HCT 30.6* 31.1* 29.3* 28.2*  MCV 99.4 99.4 100.7* 101.4*  PLT 386 347 328 296    Signed:  Barton Dubois MD.  [facility] 12/01/2020, 8:48 AM

## 2020-12-01 NOTE — Progress Notes (Signed)
Admit: 11/23/2020 LOS: 8  47M ESRD [facility] with FTT, nonadherence to HD  Subjective:  Tol HD fine yesterday For d/c today and next HD outpt tomorrow.    09/01 0701 - 09/02 0700 In: 603.8 [P.O.:490; IV Piggyback:113.8] Out: 1000 [Urine:1000]  Filed Weights   11/23/20 1416 11/23/20 2156  Weight: 64 kg 49.3 kg    Scheduled Meds:  (feeding supplement) PROSource Plus  30 mL Oral TID BM   aspirin EC  81 mg Oral Q breakfast   atorvastatin  10 mg Oral Daily   calcium carbonate  1 tablet Oral TID WC   cephALEXin  250 mg Oral Q12H   Chlorhexidine Gluconate Cloth  6 each Topical Daily   darbepoetin (ARANESP) injection - DIALYSIS  60 mcg Intravenous Q Tue-HD   DULoxetine  30 mg Oral Daily   gabapentin  100 mg Oral BID   heparin  5,000 Units Subcutaneous Q8H   midodrine  10 mg Oral TID   pantoprazole  40 mg Oral Daily   prazosin  1 mg Oral QHS   Continuous Infusions:  ferric gluconate (FERRLECIT) IVPB Stopped (11/30/20 1551)   PRN Meds:.acetaminophen **OR** acetaminophen, hydrOXYzine, oxyCODONE-acetaminophen, polyethylene glycol  Current Labs: reviewed    Physical Exam:  Blood pressure (!) 92/59, pulse 61, temperature 99 F (37.2 C), temperature source Oral, resp. rate 17, height 5\' 8"  (1.727 m), weight 49.3 kg, SpO2 98 %. Chronically ill appearing but NAD RRR CTAB Ostomy present No LEE TDC LUE AVF +t/b, hard swelling at incision site - no erythema or drainage; c/o pain at this site  A ESRD TTS [facility]; pt struggling with adherence; last HD 8/30 FTT / complex medical issues, pt exploring GOC Hypokalemia, likley nutritional and ostomy; repleted  Anemia, mild, Trend; 8/29 t sat 19% CKD-BMD: Ca and P at goal PCM / Hypoalbuminemia Chronic colostomy HTN/Vol: not a major issue right now  P Cont HD TTS per patient's wishes for now, appreciate palliative care input  Next HD tomorrow outpt -has transportation arranged Will need outpt VVS f/u rescheduled  re: AVF  - missed 8/30 appt due to inpatient --> outpt HD can help facilitate reschedule or he can call ESA with HD; give IV iron with HD  Medication Issues; Preferred narcotic agents for pain control are hydromorphone, fentanyl, and methadone. Morphine should not be used.  Baclofen should be avoided Avoid oral sodium phosphate and magnesium citrate based laxatives / bowel preps    Jannifer Hick MD [facility] Pager [phone]   Recent Labs  Lab 11/27/20 0522 11/28/20 1054 11/30/20 1231  NA 137 138 138  K 2.7* 3.9 3.7  CL 101 105 104  CO2 26 23 25   GLUCOSE 147* 101* 123*  BUN 43* 50* 47*  CREATININE 4.58* 5.30* 4.76*  CALCIUM 8.2* 8.2* 8.2*  PHOS 4.3 3.6 3.3    Recent Labs  Lab 11/27/20 0522 11/28/20 1054 11/30/20 1231  WBC 6.8 10.1 9.0  HGB 9.6* 8.8* 8.7*  HCT 31.1* 29.3* 28.2*  MCV 99.4 100.7* 101.4*  PLT 347 328 296

## 2020-12-01 NOTE — Progress Notes (Signed)
Patient requested to receive pain medication 30 minutes early before EMS arrival. MD approved.

## 2020-12-01 NOTE — Clinical Social Work Note (Signed)
CSW arranged [facility] transpiration for patient. CSW completed med necessity.

## 2020-12-26 ENCOUNTER — Encounter (HOSPITAL_COMMUNITY): Payer: No Typology Code available for payment source

## 2021-01-01 ENCOUNTER — Encounter (HOSPITAL_COMMUNITY): Payer: No Typology Code available for payment source

## 2021-01-10 ENCOUNTER — Other Ambulatory Visit: Payer: Self-pay

## 2021-01-10 ENCOUNTER — Encounter: Payer: Self-pay | Admitting: Physician Assistant

## 2021-01-10 ENCOUNTER — Ambulatory Visit (HOSPITAL_COMMUNITY)
Admission: RE | Admit: 2021-01-10 | Discharge: 2021-01-10 | Disposition: A | Discharge status: Home / Self Care | Payer: No Typology Code available for payment source | Source: Ambulatory Visit | Attending: Vascular Surgery | Admitting: Vascular Surgery

## 2021-01-10 ENCOUNTER — Ambulatory Visit (INDEPENDENT_AMBULATORY_CARE_PROVIDER_SITE_OTHER): Payer: No Typology Code available for payment source | Admitting: Physician Assistant

## 2021-01-10 VITALS — BP 110/56 | HR 75 | Temp 98.3°F | Resp 16 | Ht 68.0 in | Wt 147.6 lb

## 2021-01-10 DIAGNOSIS — Z992 Dependence on renal dialysis: Secondary | ICD-10-CM

## 2021-01-10 DIAGNOSIS — N185 Chronic kidney disease, stage 5: Secondary | ICD-10-CM | POA: Diagnosis present

## 2021-01-10 DIAGNOSIS — N186 End stage renal disease: Secondary | ICD-10-CM

## 2021-01-10 NOTE — Progress Notes (Signed)
POST OPERATIVE OFFICE NOTE    CC:  F/u for surgery  HPI:  This is a 55 y.o. male who is s/p left 1st stage BVT and insertion of right TDC (19cm) on 10/16/2020 by Dr. Carlis Abbott.     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  He was supposed to have a TURP late this week and didn't show up for procedure or pre-op labs.  He was then found to be unresponsive on a welfare check in July 2022.  He has an indwelling foley.  Nephrology consulted.  Recommended transfer to [facility] for nephrology and urologic care.  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  Conversation was had by nephrologist with pt and son and they did want to proceed with dialysis and therefore needed dialysis access.  pt is right hand dominant.   He denies any claudication sx or rest pain.  He states he walks with a walker.     He has hx of CVA with left hemiparesis.  He has hx of recurrent diverticulitis with abscess formation leading to complex urological obstruction and s/p diverting ostomy with [facility] resection of redundant sigmoid colon.     CT scan on 10/07/2020 revealed a pneumomediastinum with no clear source.    He has hx of MI in the past treated at [facility].  He has hx of Hodgkin's Lymphoma in 2011.   Pt states he does not have pain/numbness in the left hand.  He looks good and has come a long way from when he was seen in the hospital.    The pt is on dialysis T/T/S.   Allergies  Allergen Reactions   Nsaids Other (See Comments)    Bleeding GI Ulceration   Ciprofloxacin Nausea And Vomiting    Current Outpatient Medications  Medication Sig Dispense Refill   acetaminophen (TYLENOL) 500 MG tablet Take 1 tablet (500 mg total) by mouth every 6 (six) hours as needed for mild pain, fever or headache. 30 tablet 0   aspirin 81 MG EC tablet Take 1 tablet (81 mg total) by mouth daily with  breakfast. Swallow whole. 30 tablet 11   atorvastatin (LIPITOR) 10 MG tablet Take 1 tablet (10 mg total) by mouth daily. 30 tablet 1   calcium carbonate (TUMS - DOSED IN MG ELEMENTAL CALCIUM) 500 MG chewable tablet Chew 1 tablet (200 mg of elemental calcium total) by mouth 3 (three) times daily.     cyanocobalamin (,VITAMIN B-12,) 1000 MCG/ML injection Inject 1,000 mcg into the muscle every 30 (thirty) days.      DULoxetine (CYMBALTA) 30 MG capsule Take 1 capsule (30 mg total) by mouth 2 (two) times daily. 60 capsule 0   gabapentin (NEURONTIN) 100 MG capsule Take 100 mg by mouth. Take 1 capsule by mouth on Mon,Wed, and Friday for nerve pain. Take after dialysis     hydrOXYzine (ATARAX/VISTARIL) 10 MG tablet Take 1 tablet (10 mg total) by mouth 3 (three) times daily as needed for itching or anxiety. 30 tablet 0   midodrine (PROAMATINE) 10 MG tablet Take 1 tablet (10 mg total) by mouth daily. 90 tablet 0   Multiple Vitamin (MULTIVITAMIN WITH MINERALS) TABS tablet Take 1 tablet by mouth daily. 30 tablet 0   Naloxone HCl (NARCAN NA) Place 4 mg into the nose once. Spray 1 spray into one nostril as directed  ( turn person on side after dose). If no  response in 2 to 3 minutes or person responds but relapes a new sprays,repeat  using  a new spray.     pantoprazole (PROTONIX) 20 MG tablet TAKE 1 TABLET (20 MG TOTAL) BY MOUTH 2 (TWO) TIMES DAILY. 15 tablet 0   polycarbophil (FIBERCON) 625 MG tablet Take 1 tablet (625 mg total) by mouth 2 (two) times daily. (Patient not taking: Reported on 10/07/2020) 60 tablet 3   polyethylene glycol (MIRALAX / GLYCOLAX) 17 g packet Take 17 g by mouth daily as needed for moderate constipation. 14 each 0   prazosin (MINIPRESS) 1 MG capsule Take 1 mg by mouth at bedtime.     silodosin (RAPAFLO) 8 MG CAPS capsule Take 1 capsule (8 mg total) by mouth daily with breakfast. 30 capsule 0   sodium bicarbonate 650 MG tablet TAKE 1 TABLET (650 MG TOTAL) BY MOUTH 2 (TWO) TIMES DAILY. 30  tablet 0   tamsulosin (FLOMAX) 0.4 MG CAPS capsule Take 0.4 mg by mouth at bedtime.     Vitamin D3 (VITAMIN D) 25 MCG tablet TAKE 800 UNITS BY MOUTH DAILY. (Patient not taking: Reported on 10/07/2020) 75 tablet 0   No current facility-administered medications for this visit.     ROS:  See HPI  Physical Exam:  Today's Vitals   01/10/21 1525 01/10/21 1528  BP: (!) 110/56   Pulse: 75   Resp: 16   Temp: 98.3 F (36.8 C)   TempSrc: Temporal   SpO2: 97%   Weight: 147 lb 9.6 oz (67 kg)   Height: 5\' 8"  (1.727 m)   PainSc: 7  7   PainLoc: Generalized    Body mass index is 22.44 kg/m.   Incision:  well healed Extremities:   There is a palpable left radial pulse.   Motor and sensory are in tact.   There is a thrill/bruit present.  The fistula is easily palpable   Dialysis Duplex on 01/10/2021: +------------+----------+-------------+----------+--------+  OUTFLOW VEINPSV (cm/s)Diameter (cm)Depth (cm)Describe  +------------+----------+-------------+----------+--------+  Prox UA         61        0.99        0.45             +------------+----------+-------------+----------+--------+  Mid UA          57        1.08        0.55             +------------+----------+-------------+----------+--------+  Dist UA        167        1.05        0.57             +------------+----------+-------------+----------+--------+  AC Fossa       349        0.71        0.16             +------------+----------+-------------+----------+--------+    Assessment/Plan:  This is a 55 y.o. male who is s/p: left 1st stage BVT and insertion of right TDC (19cm) on 10/16/2020 by Dr. Carlis Abbott.    -the pt does not have evidence of steal. -the fistula has matured nicely and will need a second stage BVT.  Will schedule this on a non dialysis day and with Dr. Carlis Abbott.  I discussed this with the pt that complications can include but not limited to nerve damage, bleeding.  Also discussed that  the incisions would have to go over his tattoo  on the inner arm.  He expressed understanding. -If pt has a tunneled dialysis catheter and the access has been used successfully to the satisfaction of the dialysis center, the tunneled catheter can be scheduled to be removed at their discretion.   -discussed with pt that access does not last forever and will need intervention or even new access at some point.     Leontine Locket, [facility] [phone]  Clinic MD:  Donzetta Matters

## 2021-01-11 ENCOUNTER — Other Ambulatory Visit: Payer: Self-pay

## 2021-01-23 ENCOUNTER — Encounter (HOSPITAL_COMMUNITY): Payer: Self-pay | Admitting: Vascular Surgery

## 2021-01-23 ENCOUNTER — Other Ambulatory Visit: Payer: Self-pay

## 2021-01-23 NOTE — Progress Notes (Signed)
Spoke with pt for pre-op call. Pt has hx of Hodgkin's lymphoma which is in remission. Pt is on dialysis. Denies HTN or Diabetes. Pt has a foley catheter and a colostomy bag.  Pt's surgery is scheduled as ambulatory so no Covid test is required prior to surgery.

## 2021-01-23 NOTE — Anesthesia Preprocedure Evaluation (Deleted)
Anesthesia Evaluation    Reviewed: Allergy & Precautions, Patient's Chart, lab work & pertinent test results  History of Anesthesia Complications Negative for: history of anesthetic complications  Airway        Dental   Pulmonary neg pulmonary ROS, former smoker,           Cardiovascular + Past MI    Echo 06/28/20: IMPRESSIONS  1. Left ventricular ejection fraction, by estimation, is 60 to 65%. The  left ventricle has normal function. The left ventricle has no regional  wall motion abnormalities. Left ventricular diastolic parameters were  normal.  2. Right ventricular systolic function is normal. The right ventricular  size is normal. There is normal pulmonary artery systolic pressure.  3. Left atrial size was mildly dilated.  4. The mitral valve is normal in structure. No evidence of mitral valve  regurgitation. No evidence of mitral stenosis.  5. The aortic valve is normal in structure. Aortic valve regurgitation is  not visualized. No aortic stenosis is present.  6. The inferior vena cava is normal in size with greater than 50%  respiratory variability, suggesting right atrial pressure of 3 mmHg.  Cardiac cath 06/09/18 ([facility]): Narrative: LCP for NSTEMI. Right radial artery. Mild luminal irregularities.  No culprit lesion to explain elevated troponin.     Neuro/Psych PSYCHIATRIC DISORDERS Anxiety Depression CVA    GI/Hepatic Neg liver ROS, GERD  ,Colostomy   Endo/Other  negative endocrine ROS  Renal/GU ESRF and DialysisRenal disease     Musculoskeletal negative musculoskeletal ROS (+)   Abdominal   Peds  Hematology negative hematology ROS (+) anemia , Hodgkin's Lymphoma   Anesthesia Other Findings   Reproductive/Obstetrics                            Anesthesia Physical Anesthesia Plan  ASA: 4  Anesthesia Plan:    Post-op Pain Management:    Induction:   PONV  Risk Score and Plan:   Airway Management Planned:   Additional Equipment:   Intra-op Plan:   Post-operative Plan:   Informed Consent:   Plan Discussed with: Anesthesiologist  Anesthesia Plan Comments: (PAT note written 01/23/2021 by Myra Gianotti, PA-C. )       Anesthesia Quick Evaluation

## 2021-01-23 NOTE — Progress Notes (Signed)
Anesthesia Chart Review: Chase [name]  Case: [id number] Date/Time: 01/24/21 0915   Procedure: LEFT SECOND STAGE BASILIC VEIN TRANSPOSITION (Left)   Anesthesia type: Choice   Pre-op diagnosis: ESRD   Location: [facility] OR ROOM 12 / [place] OR   Surgeons: [name], MD       DISCUSSION: Patient is a 55 year old male scheduled for the above procedure. S/p first stage left basilic vein transposition and placement of right IJ tunneled dialysis catheter 10/16/20.   History includes never smoker, ESRD (started HD 10/16/20), PTSD, Hodgkin lymphoma (~ 2010, s/p ABVD chemotherapy and radiation), CVA (embolic 2/95/28; TPA not given due to thrombocytopenia felt related to tickborne illness), interatrial shunt (+ Bubble stundy 05/2018), MI (05/2018 NSTEMI type 2 in setting of transient SVT, AKI with bladder outlet obstruction, LHC showed no culprit lesions, mild luminal irregularities; 09/2019 demand ischemia in setting of CVA, AKI/metabolic acidosis, UTI), dyspnea, anemia, gastric bypass (1996; bleeding anastomotic ulcer 2014), colovesical fistula (due to sigmoid diverticulitis, s/p percutaneous drain 11/15/19; s/p laparoscopic sigmoid colotomy with colostomy 04/18/20), GERD, spinal surgery (C3-4 ACDF 11/10/02), COVID-19 (12/2018).  - [place] admission 07/13/22-4/0/10 for metabolic encephalopathy in setting of missed dialysis for 13 days. Hemodialysis resumed.  Has chronic indwelling Foley catheter due to BPH with LUTS.  Previous  complicated CAUTI with Pseudomonas and pansensitive E. coli. 11/23/20 urine culture + E. Coli, s/p Keflex.and foley exchanged. Patient weak but able to perform ADLs. He declined PT, but reportedly in contact with social work at [facility] and waiting for ALF bed availability in the future.  - [place] admission 10/06/20-10/24/20. He had a indwelling foley catheter and was scheduled for TURP on 10/05/20, but he did not show for surgery. EMS performed a wellness check on 10/06/20 and found patient  minimally responsive with frank pus inside the tubing and foley drainage bag. He was found to have acute on chronic renal failure with metabolic acidosis and UTI.  CT chest showed pneumomediastinum.  CT  abd/pelvis showed bilateral ureteral stents in good position but with moderate to severe bilateral hydronephrosis with concern for bladder outlet obstruction with enlarged prostate.  PCCM, nephrology and urology consulted. He was started on antibiotics and bicarb gtt. Started on CRRT 10/07/20. IR placed bilateral nephrostomy tubes (10/08/20-10/23/20). S/p first stage left basilic vein transposition and placement of right IJ tunneled dialysis catheter 10/16/20.  Received 2 units PRBC 10/09/20 for chronic anemia in setting of ESRD and severe protein calorie malnutrition.  - [place] admission 03/31/20-05/03/20 ford acute on chronic renal failure in setting of bilateral ureteral stenosis with ureteral stents and bilateral nephrostomy tubes that had bee pulled out. He was found at at home down and obtunded.  Bilateral nephrostomy tubes replaced (04/01/20-04/25/20) and temporary HD catheter placed 04/01/20 for short term dialysis. S/p cystectomy anda bilateral ureteral stent exchange 04/18/20 by urology combined with laparoscopic sigmoid colotomy with colostomy by general surgery for chronic colovesical fistula. Discharged with foley.   He is a same-day work-up, so anesthesia team to evaluate on the day of surgery.  VS: WT 01/10/21: 67 kg BP Readings from Last 3 Encounters:  01/10/21 (!) 110/56  10/26/20 119/74  07/07/20 105/64   Pulse Readings from Last 3 Encounters:  01/10/21 75  10/26/20 (!) 58  07/07/20 65     PROVIDERS: Clinic, Guerry Minors, MD is urologist Michael Boston, MD is general surgeon Carney Bern, MD is cardiologist [facility]). Care Everywhere suggests last encounter 08/05/19. (Notes note available. [place] admission 05/2018 with  NSTEMI type 2 in setting of transient SVT, AKI  with bladder outlet obstruction. LHC showed no culprit lesion with mild coronary irregularities. LVEF 40% by echo with mild-moderate global hypokinesis 06/05/18 but up to 55-60% on 06/07/18 echo. + Agitated saline bubble study 06/07/18.)   LABS: For day of surgery as indicated. At [facility]: As of 01/13/21 H/H 9.0/28.9, and as of 01/04/21 glucose 71, K 4.6, Cr 4.96, AST 12, ALT 14. .   IMAGES: 1V PCXR 11/23/20: FINDINGS: - A right chest wall dialysis catheter is in stable position. - The cardiomediastinal silhouette is stable. - There is unchanged asymmetric elevation of the right hemidiaphragm. Aeration of the right base is improved compared to the prior study. There is no focal consolidation or pulmonary edema on the current study. There is no pleural effusion or pneumothorax. - Right upper quadrant and right axillary surgical clips are noted. There is no acute osseous abnormality. IMPRESSION: Improved aeration of the right base compared to 10/16/2020. No radiographic evidence of acute cardiopulmonary process currently.  CT Head 11/23/20: IMPRESSION: 1. Stable head CT.  No acute intracranial process.  CTA head & neck 10/17/19: IMPRESSION: No large vessel occlusion or hemodynamically significant stenosis.   EKG: 10/07/20: Marked sinus bradycardia at 39 bpm Low voltage QRS Abnormal ECG No significant change since last tracing Confirmed by Kirk Ruths [phone]) on 10/08/2020 8:35:29 AM   CV: Echo (limited/attempted) 10/08/20: Sonographer Comments: Unable to find parasternal or apical imaging  windows. Subcostal area covered with ostomy bag and bandages.  Uninterpretable study.    Echo 06/28/20: IMPRESSIONS   1. Left ventricular ejection fraction, by estimation, is 60 to 65%. The  left ventricle has normal function. The left ventricle has no regional  wall motion abnormalities. Left ventricular diastolic parameters were  normal.   2. Right ventricular systolic function is normal. The  right ventricular  size is normal. There is normal pulmonary artery systolic pressure.   3. Left atrial size was mildly dilated.   4. The mitral valve is normal in structure. No evidence of mitral valve  regurgitation. No evidence of mitral stenosis.   5. The aortic valve is normal in structure. Aortic valve regurgitation is  not visualized. No aortic stenosis is present.   6. The inferior vena cava is normal in size with greater than 50%  respiratory variability, suggesting right atrial pressure of 3 mmHg.    Limited echo for agitated bubble study 06/09/18 ([facility]): SUMMARY  RIGHT ATRIUM  Limited study, not performed. Injection of agitated saline showed  right-to-left interatrial shunt. Injection of agitated saline showed moderate right-to-left shunt.    Shunting occurred spontaneously.    Cardiac cath 06/09/18 ([facility]): Narrative: LCP for NSTEMI. Right radial artery. Mild luminal irregularities.  No culprit lesion to explain elevated troponin.    Past Medical History:  Diagnosis Date   Anemia    Cervical radiculopathy    with left sided weakness   Chemical exposure    service related injury   Chronic pain    CKD (chronic kidney disease), stage IV ([place]) 10/17/2019   COVID-19    Dyspnea    GERD (gastroesophageal reflux disease)    GIB (gastrointestinal bleeding)    Headache    Heart attack ([place])    History of CVA (cerebrovascular accident) 12/31/2019   History of gastric bypass 08/24/2019   History of stomach ulcers 05/19/2013   Formatting of this note might be different from the original. Attributed to chronic ibuprofen use   Hodgkin  lymphoma, unspecified, unspecified site [facility]) 12/10/2012   Formatting of this note might be different from the original. In remission.   Hypokalemia    Hypomagnesemia    PTSD (post-traumatic stress disorder)    Renal disorder    Dialysis T/Th/Sa   Stroke [facility])    Urinary retention    due to bladder outlet obstruction    Past  Surgical History:  Procedure Laterality Date   AV FISTULA PLACEMENT Left 10/16/2020   Procedure: LEFT ARM ARTERIOVENOUS BRACHIOBASILIC FISTULA CREATION;  Surgeon: [name], MD;  Location: [place];  Service: Vascular;  Laterality: Left;   CHOLECYSTECTOMY     COLONOSCOPY     COLONOSCOPY WITH PROPOFOL N/A 04/18/2020   Procedure: COLONOSCOPY WITH PROPOFOL;  Surgeon: Doran Stabler, MD;  Location: [place];  Service: Gastroenterology;  Laterality: N/A;   CYSTOSCOPY W/ URETERAL STENT PLACEMENT N/A 11/14/2019   Procedure: CYSTOSCOPY WITH RETROGRADE PYELOGRAM bilateral Wyvonnia Dusky STENT PLACEMENT left fulguration bladder . cystogram;  Surgeon: Raynelle Bring, MD;  Location: [facility];  Service: Urology;  Laterality: N/A;   CYSTOSCOPY W/ URETERAL STENT PLACEMENT Bilateral 12/25/2019   Procedure: CYSTOSCOPY WITH bilateral  RETROGRADE PYELOGRAM/ leftURETERAL STENT PLACEMENT;  Surgeon: Lucas Mallow, MD;  Location: [facility];  Service: Urology;  Laterality: Bilateral;   CYSTOSCOPY W/ URETERAL STENT PLACEMENT Bilateral 07/06/2020   Procedure: CYSTOSCOPY WITH BILATERAL STENT EXCHANGE; BILATERAL PYELOGRAM RETROGRADE;  Surgeon: Raynelle Bring, MD;  Location: [facility];  Service: Urology;  Laterality: Bilateral;   CYSTOSCOPY WITH URETEROSCOPY AND STENT PLACEMENT N/A 04/18/2020   Procedure: CYSTOSCOPY WITH BILATERAL URETERAL STENT CHANGE PLACEMENT;  Surgeon: Ardis Hughs, MD;  Location: [place];  Service: Urology;  Laterality: N/A;   EPIGASTRIC HERNIA REPAIR N/A 04/18/2020   Procedure: HERNIA REPAIR EPIGASTRIC ADULT;  Surgeon: Michael Boston, MD;  Location: [place];  Service: General;  Laterality: N/A;   GASTRIC BYPASS     HERNIA REPAIR     INSERTION OF DIALYSIS CATHETER Right 10/16/2020   Procedure: INSERTION OF RIGHT INTERNAL JUGULAR TUNNELED DIALYSIS CATHETER WITH ULTRASOUND GUIDANCE;  Surgeon: [name], MD;  Location: [place];  Service: Vascular;  Laterality: Right;   IR FLUORO GUIDE CV  LINE RIGHT  04/01/2020   IR NEPHRO TUBE REMOV/FL  04/25/2020   IR NEPHRO TUBE REMOV/FL  10/23/2020   IR NEPHRO TUBE REMOV/FL  10/23/2020   IR NEPHROSTOMY PLACEMENT LEFT  12/27/2019   IR NEPHROSTOMY PLACEMENT LEFT  04/01/2020   IR NEPHROSTOMY PLACEMENT LEFT  10/08/2020   IR NEPHROSTOMY PLACEMENT RIGHT  11/16/2019   IR NEPHROSTOMY PLACEMENT RIGHT  12/27/2019   IR NEPHROSTOMY PLACEMENT RIGHT  04/01/2020   IR NEPHROSTOMY PLACEMENT RIGHT  10/08/2020   IR SINUS/FIST TUBE CHK-NON GI  12/03/2019   IR URETERAL STENT PLACEMENT EXISTING ACCESS RIGHT  12/08/2019   IR URETERAL STENT PLACEMENT EXISTING ACCESS RIGHT  01/26/2020   IR US GUIDANCE  10/08/2020   IR US GUIDANCE  10/08/2020   IR US GUIDE VASC ACCESS RIGHT  04/01/2020   LAPAROSCOPIC LOOP COLOSTOMY N/A 04/18/2020   Procedure: LAPAROSCOPIC SIGMOID COLOTOMY WITH COLOSTOMY;  Surgeon: Michael Boston, MD;  Location: [place];  Service: General;  Laterality: N/A;    MEDICATIONS: No current facility-administered medications for this encounter.    acetaminophen (TYLENOL) 500 MG tablet   amitriptyline (ELAVIL) 10 MG tablet   calcium carbonate (TUMS - DOSED IN MG ELEMENTAL CALCIUM) 500 MG chewable tablet   cyanocobalamin (,VITAMIN B-12,) 1000 MCG/ML injection   gabapentin (  NEURONTIN) 100 MG capsule   hydrOXYzine (ATARAX/VISTARIL) 10 MG tablet   midodrine (PROAMATINE) 10 MG tablet   Multiple Vitamin (MULTIVITAMIN WITH MINERALS) TABS tablet   Naloxone HCl (NARCAN NA)   OVER THE COUNTER MEDICATION   pantoprazole (PROTONIX) 20 MG tablet   prazosin (MINIPRESS) 1 MG capsule   tamsulosin (FLOMAX) 0.4 MG CAPS capsule   aspirin 81 MG EC tablet   atorvastatin (LIPITOR) 10 MG tablet   DULoxetine (CYMBALTA) 30 MG capsule   polycarbophil (FIBERCON) 625 MG tablet   polyethylene glycol (MIRALAX / GLYCOLAX) 17 g packet   silodosin (RAPAFLO) 8 MG CAPS capsule   sodium bicarbonate 650 MG tablet   Vitamin D3 (VITAMIN D) 25 MCG tablet    Myra Gianotti,  PA-C Surgical Short Stay/Anesthesiology [facility] Phone [phone] [facility] Phone [phone] 01/23/2021 5:26 PM

## 2021-01-24 ENCOUNTER — Telehealth: Payer: Self-pay

## 2021-01-24 ENCOUNTER — Ambulatory Visit (HOSPITAL_COMMUNITY)
Admission: RE | Admit: 2021-01-24 | Payer: No Typology Code available for payment source | Source: Home / Self Care | Admitting: Vascular Surgery

## 2021-01-24 HISTORY — DX: Pneumonia, unspecified organism: J18.9

## 2021-01-24 HISTORY — DX: Other specified congenital malformations of heart: Q24.8

## 2021-01-24 SURGERY — TRANSPOSITION, VEIN, BASILIC
Anesthesia: Choice | Laterality: Left

## 2021-01-24 NOTE — Progress Notes (Addendum)
Patient called and stated that he was going to have to cancel his procedure for today because he was going to the emergency room for extreme pain and no urine in catheter since midnight.     OR desk aware.  Dr. Carlis Abbott is aware.

## 2021-01-24 NOTE — Telephone Encounter (Signed)
Pt called, reporting he had to cancel his surgery this morning due to bladder pain and unable to urinate. He went to an ER in [place], who irrigated his catheter to remove a blockage. Pt requested to reschedule left second stage BVT. Surgery rescheduled for 02/05/21. Pt verbalized understanding.

## 2021-02-01 ENCOUNTER — Other Ambulatory Visit: Payer: Self-pay

## 2021-02-01 ENCOUNTER — Encounter (HOSPITAL_COMMUNITY): Payer: Self-pay | Admitting: Vascular Surgery

## 2021-02-01 NOTE — Progress Notes (Signed)
Spoke with pt on 01/23/21 for surgery and surgery had to be cancelled since his foley was obstructed and he had to go to the ED. He states today that nothing has changed with his medical history. The foley was flushed that day, but he is getting a new foley put in tomorrow.   Pt's surgery is scheduled as ambulatory so no Covid test is required prior to surgery.

## 2021-02-05 ENCOUNTER — Ambulatory Visit (HOSPITAL_COMMUNITY): Payer: No Typology Code available for payment source | Admitting: Certified Registered"

## 2021-02-05 ENCOUNTER — Encounter (HOSPITAL_COMMUNITY)
Admission: RE | Disposition: A | Discharge status: Home / Self Care | Payer: Self-pay | Source: Ambulatory Visit | Attending: Vascular Surgery

## 2021-02-05 ENCOUNTER — Other Ambulatory Visit: Payer: Self-pay

## 2021-02-05 ENCOUNTER — Encounter (HOSPITAL_COMMUNITY): Payer: Self-pay | Admitting: Vascular Surgery

## 2021-02-05 ENCOUNTER — Ambulatory Visit (HOSPITAL_COMMUNITY)
Admission: RE | Admit: 2021-02-05 | Discharge: 2021-02-05 | Disposition: A | Discharge status: Home / Self Care | Payer: No Typology Code available for payment source | Source: Ambulatory Visit | Attending: Vascular Surgery | Admitting: Vascular Surgery

## 2021-02-05 DIAGNOSIS — Z9884 Bariatric surgery status: Secondary | ICD-10-CM | POA: Diagnosis not present

## 2021-02-05 DIAGNOSIS — Z87891 Personal history of nicotine dependence: Secondary | ICD-10-CM | POA: Insufficient documentation

## 2021-02-05 DIAGNOSIS — Z8571 Personal history of Hodgkin lymphoma: Secondary | ICD-10-CM | POA: Diagnosis not present

## 2021-02-05 DIAGNOSIS — I69354 Hemiplegia and hemiparesis following cerebral infarction affecting left non-dominant side: Secondary | ICD-10-CM | POA: Insufficient documentation

## 2021-02-05 DIAGNOSIS — N186 End stage renal disease: Secondary | ICD-10-CM | POA: Insufficient documentation

## 2021-02-05 DIAGNOSIS — K219 Gastro-esophageal reflux disease without esophagitis: Secondary | ICD-10-CM | POA: Diagnosis not present

## 2021-02-05 DIAGNOSIS — Z992 Dependence on renal dialysis: Secondary | ICD-10-CM

## 2021-02-05 DIAGNOSIS — I252 Old myocardial infarction: Secondary | ICD-10-CM | POA: Insufficient documentation

## 2021-02-05 DIAGNOSIS — Z9049 Acquired absence of other specified parts of digestive tract: Secondary | ICD-10-CM | POA: Insufficient documentation

## 2021-02-05 HISTORY — PX: BASCILIC VEIN TRANSPOSITION: SHX5742

## 2021-02-05 LAB — POCT I-STAT, CHEM 8
BUN: 56 mg/dL — ABNORMAL HIGH (ref 6–20)
Calcium, Ion: 1.09 mmol/L — ABNORMAL LOW (ref 1.15–1.40)
Chloride: 107 mmol/L (ref 98–111)
Creatinine, Ser: 7 mg/dL — ABNORMAL HIGH (ref 0.61–1.24)
Glucose, Bld: 94 mg/dL (ref 70–99)
HCT: 34 % — ABNORMAL LOW (ref 39.0–52.0)
Hemoglobin: 11.6 g/dL — ABNORMAL LOW (ref 13.0–17.0)
Potassium: 4.5 mmol/L (ref 3.5–5.1)
Sodium: 140 mmol/L (ref 135–145)
TCO2: 24 mmol/L (ref 22–32)

## 2021-02-05 SURGERY — TRANSPOSITION, VEIN, BASILIC
Anesthesia: General | Site: Arm Upper | Laterality: Left

## 2021-02-05 MED ORDER — LIDOCAINE 2% (20 MG/ML) 5 ML SYRINGE
INTRAMUSCULAR | Status: DC | PRN
  Administered 2021-02-05: 100 mg via INTRAVENOUS

## 2021-02-05 MED ORDER — HEPARIN 6000 UNIT IRRIGATION SOLUTION
Status: DC | PRN
  Administered 2021-02-05: 1

## 2021-02-05 MED ORDER — ACETAMINOPHEN 500 MG PO TABS
1000.0000 mg | ORAL_TABLET | Freq: Once | ORAL | Status: AC
  Administered 2021-02-05: 500 mg via ORAL
  Filled 2021-02-05: qty 2

## 2021-02-05 MED ORDER — EPHEDRINE 5 MG/ML INJ
INTRAVENOUS | Status: AC
  Filled 2021-02-05: qty 5

## 2021-02-05 MED ORDER — ONDANSETRON HCL 4 MG/2ML IJ SOLN
INTRAMUSCULAR | Status: DC | PRN
  Administered 2021-02-05: 4 mg via INTRAVENOUS

## 2021-02-05 MED ORDER — HEPARIN SODIUM (PORCINE) 1000 UNIT/ML IJ SOLN
INTRAMUSCULAR | Status: DC | PRN
  Administered 2021-02-05: 3000 [IU] via INTRAVENOUS

## 2021-02-05 MED ORDER — CEFAZOLIN SODIUM-DEXTROSE 2-3 GM-%(50ML) IV SOLR
INTRAVENOUS | Status: DC | PRN
  Administered 2021-02-05: 2 g via INTRAVENOUS

## 2021-02-05 MED ORDER — LIDOCAINE 2% (20 MG/ML) 5 ML SYRINGE
INTRAMUSCULAR | Status: AC
  Filled 2021-02-05: qty 5

## 2021-02-05 MED ORDER — PROPOFOL 10 MG/ML IV BOLUS
INTRAVENOUS | Status: DC | PRN
  Administered 2021-02-05: 90 mg via INTRAVENOUS

## 2021-02-05 MED ORDER — ORAL CARE MOUTH RINSE: 15.0000 mL | Freq: Once | OROMUCOSAL | Status: AC

## 2021-02-05 MED ORDER — FENTANYL CITRATE (PF) 100 MCG/2ML IJ SOLN
INTRAMUSCULAR | Status: DC | PRN
  Administered 2021-02-05 (×4): 50 ug via INTRAVENOUS
  Administered 2021-02-05: 25 ug via INTRAVENOUS

## 2021-02-05 MED ORDER — FENTANYL CITRATE (PF) 100 MCG/2ML IJ SOLN
25.0000 ug | INTRAMUSCULAR | Status: DC | PRN
  Administered 2021-02-05 (×2): 50 ug via INTRAVENOUS

## 2021-02-05 MED ORDER — HEPARIN 6000 UNIT IRRIGATION SOLUTION
Status: AC
  Filled 2021-02-05: qty 500

## 2021-02-05 MED ORDER — OXYCODONE-ACETAMINOPHEN 5-325 MG PO TABS
1.0000 | ORAL_TABLET | Freq: Once | ORAL | Status: AC
  Administered 2021-02-05: 1 via ORAL

## 2021-02-05 MED ORDER — FENTANYL CITRATE (PF) 100 MCG/2ML IJ SOLN
INTRAMUSCULAR | Status: AC
  Filled 2021-02-05: qty 2

## 2021-02-05 MED ORDER — OXYCODONE-ACETAMINOPHEN 5-325 MG PO TABS: 1.0000 | ORAL_TABLET | Freq: Four times a day (QID) | ORAL | 0 refills | Status: DC | PRN

## 2021-02-05 MED ORDER — HEPARIN SODIUM (PORCINE) 1000 UNIT/ML IJ SOLN
INTRAMUSCULAR | Status: AC
  Filled 2021-02-05: qty 1

## 2021-02-05 MED ORDER — CHLORHEXIDINE GLUCONATE 0.12 % MT SOLN
15.0000 mL | Freq: Once | OROMUCOSAL | Status: AC
  Administered 2021-02-05: 15 mL via OROMUCOSAL
  Filled 2021-02-05: qty 15

## 2021-02-05 MED ORDER — ONDANSETRON HCL 4 MG/2ML IJ SOLN
INTRAMUSCULAR | Status: AC
  Filled 2021-02-05: qty 2

## 2021-02-05 MED ORDER — SODIUM CHLORIDE 0.9 % IV SOLN: INTRAVENOUS | Status: DC | PRN

## 2021-02-05 MED ORDER — EPHEDRINE SULFATE-NACL 50-0.9 MG/10ML-% IV SOSY
PREFILLED_SYRINGE | INTRAVENOUS | Status: DC | PRN
  Administered 2021-02-05 (×2): 10 mg via INTRAVENOUS
  Administered 2021-02-05: 5 mg via INTRAVENOUS

## 2021-02-05 MED ORDER — FENTANYL CITRATE (PF) 250 MCG/5ML IJ SOLN
INTRAMUSCULAR | Status: AC
  Filled 2021-02-05: qty 5

## 2021-02-05 MED ORDER — SODIUM CHLORIDE 0.9 % IV SOLN: INTRAVENOUS | Status: DC

## 2021-02-05 MED ORDER — 0.9 % SODIUM CHLORIDE (POUR BTL) OPTIME
TOPICAL | Status: DC | PRN
  Administered 2021-02-05: 1000 mL

## 2021-02-05 MED ORDER — PROPOFOL 10 MG/ML IV BOLUS
INTRAVENOUS | Status: AC
  Filled 2021-02-05: qty 20

## 2021-02-05 MED ORDER — ONDANSETRON HCL 4 MG/2ML IJ SOLN: 4.0000 mg | Freq: Once | INTRAMUSCULAR | Status: DC | PRN

## 2021-02-05 MED ORDER — PHENYLEPHRINE HCL (PRESSORS) 10 MG/ML IV SOLN
INTRAVENOUS | Status: DC | PRN
  Administered 2021-02-05 (×2): 120 ug via INTRAVENOUS
  Administered 2021-02-05: 80 ug via INTRAVENOUS

## 2021-02-05 MED ORDER — OXYCODONE-ACETAMINOPHEN 5-325 MG PO TABS
ORAL_TABLET | ORAL | Status: AC
  Filled 2021-02-05: qty 1

## 2021-02-05 MED ORDER — PHENYLEPHRINE 40 MCG/ML (10ML) SYRINGE FOR IV PUSH (FOR BLOOD PRESSURE SUPPORT)
PREFILLED_SYRINGE | INTRAVENOUS | Status: AC
  Filled 2021-02-05: qty 10

## 2021-02-05 MED ORDER — CEFAZOLIN SODIUM-DEXTROSE 2-4 GM/100ML-% IV SOLN
INTRAVENOUS | Status: AC
  Filled 2021-02-05: qty 100

## 2021-02-05 SURGICAL SUPPLY — 45 items
ADH SKN CLS APL DERMABOND .7 (GAUZE/BANDAGES/DRESSINGS) ×1
AGENT HMST SPONGE THK3/8 (HEMOSTASIS)
ARMBAND PINK RESTRICT EXTREMIT (MISCELLANEOUS) ×2 IMPLANT
BAG COUNTER SPONGE SURGICOUNT (BAG) ×2 IMPLANT
BAG SPNG CNTER NS LX DISP (BAG) ×1
CANISTER SUCT 3000ML PPV (MISCELLANEOUS) ×2 IMPLANT
CLIP VESOCCLUDE MED 24/CT (CLIP) ×2 IMPLANT
CLIP VESOCCLUDE SM WIDE 24/CT (CLIP) ×2 IMPLANT
COVER PROBE W GEL 5X96 (DRAPES) ×2 IMPLANT
DECANTER SPIKE VIAL GLASS SM (MISCELLANEOUS) ×2 IMPLANT
DERMABOND ADVANCED (GAUZE/BANDAGES/DRESSINGS) ×1
DERMABOND ADVANCED .7 DNX12 (GAUZE/BANDAGES/DRESSINGS) ×1 IMPLANT
ELECT REM PT RETURN 9FT ADLT (ELECTROSURGICAL) ×2
ELECTRODE REM PT RTRN 9FT ADLT (ELECTROSURGICAL) ×1 IMPLANT
GAUZE 4X4 16PLY ~~LOC~~+RFID DBL (SPONGE) ×1 IMPLANT
GLOVE SRG 8 PF TXTR STRL LF DI (GLOVE) ×1 IMPLANT
GLOVE SURG ENC MOIS LTX SZ7.5 (GLOVE) ×2 IMPLANT
GLOVE SURG MICRO LTX SZ7.5 (GLOVE) ×1 IMPLANT
GLOVE SURG UNDER POLY LF SZ6.5 (GLOVE) ×1 IMPLANT
GLOVE SURG UNDER POLY LF SZ8 (GLOVE) ×2
GOWN STRL REUS W/ TWL LRG LVL3 (GOWN DISPOSABLE) ×2 IMPLANT
GOWN STRL REUS W/ TWL XL LVL3 (GOWN DISPOSABLE) ×2 IMPLANT
GOWN STRL REUS W/TWL LRG LVL3 (GOWN DISPOSABLE) ×4
GOWN STRL REUS W/TWL XL LVL3 (GOWN DISPOSABLE) ×4
HEMOSTAT SPONGE AVITENE ULTRA (HEMOSTASIS) IMPLANT
KIT BASIN OR (CUSTOM PROCEDURE TRAY) ×2 IMPLANT
KIT TURNOVER KIT B (KITS) ×2 IMPLANT
NDL HYPO 25GX1X1/2 BEV (NEEDLE) ×1 IMPLANT
NEEDLE HYPO 25GX1X1/2 BEV (NEEDLE) ×2 IMPLANT
NS IRRIG 1000ML POUR BTL (IV SOLUTION) ×2 IMPLANT
PACK CV ACCESS (CUSTOM PROCEDURE TRAY) ×2 IMPLANT
PAD ARMBOARD 7.5X6 YLW CONV (MISCELLANEOUS) ×4 IMPLANT
PENCIL BUTTON HOLSTER BLD 10FT (ELECTRODE) ×1 IMPLANT
SPONGE T-LAP 18X18 ~~LOC~~+RFID (SPONGE) ×1 IMPLANT
SUT MNCRL AB 4-0 PS2 18 (SUTURE) ×4 IMPLANT
SUT PROLENE 6 0 BV (SUTURE) ×3 IMPLANT
SUT PROLENE 7 0 BV 1 (SUTURE) IMPLANT
SUT SILK 2 0 SH (SUTURE) ×1 IMPLANT
SUT VIC AB 2-0 CT1 27 (SUTURE)
SUT VIC AB 2-0 CT1 TAPERPNT 27 (SUTURE) ×1 IMPLANT
SUT VIC AB 3-0 SH 27 (SUTURE) ×6
SUT VIC AB 3-0 SH 27X BRD (SUTURE) ×2 IMPLANT
TOWEL GREEN STERILE (TOWEL DISPOSABLE) ×2 IMPLANT
UNDERPAD 30X36 HEAVY ABSORB (UNDERPADS AND DIAPERS) ×2 IMPLANT
WATER STERILE IRR 1000ML POUR (IV SOLUTION) ×2 IMPLANT

## 2021-02-05 NOTE — H&P (Signed)
History and Physical Interval Note:  02/05/2021 8:45 AM  Chase [name]  has presented today for surgery, with the diagnosis of ESRD.  The various methods of treatment have been discussed with the patient and family. After consideration of risks, benefits and other options for treatment, the patient has consented to  Procedure(s): LEFT SECOND STAGE BASILIC VEIN TRANSPOSITION (Left) as a surgical intervention.  The patient's history has been reviewed, patient examined, no change in status, stable for surgery.  I have reviewed the patient's chart and labs.  Questions were answered to the patient's satisfaction.    Left 2nd stage BVT  Chase [name]  POST OPERATIVE OFFICE NOTE       CC:  F/u for surgery   HPI:  This is a 55 y.o. male who is s/p left 1st stage BVT and insertion of right TDC (19cm) on 10/16/2020 by Dr. Carlis Abbott.      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  He was supposed to have a TURP late this week and didn't show up for procedure or pre-op labs.  He was then found to be unresponsive on a welfare check in July 2022.  He has an indwelling foley.  Nephrology consulted.  Recommended transfer to [facility] for nephrology and urologic care.  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  Conversation was had by nephrologist with pt and son and they did want to proceed with dialysis and therefore needed dialysis access.  pt is right hand dominant.   He denies any claudication sx or rest pain.  He states he walks with a walker.     He has hx of CVA with left hemiparesis.  He has hx of recurrent diverticulitis with abscess formation leading to complex urological obstruction and s/p diverting ostomy with [facility] resection of redundant sigmoid colon.     CT scan on 10/07/2020 revealed a pneumomediastinum with no clear source.    He has hx of MI in the past  treated at [facility].  He has hx of Hodgkin's Lymphoma in 2011.     Pt states he does not have pain/numbness in the left hand.  He looks good and has come a long way from when he was seen in the hospital.     The pt is on dialysis T/T/S.          Allergies  Allergen Reactions   Nsaids Other (See Comments)      Bleeding GI Ulceration   Ciprofloxacin Nausea And Vomiting            Current Outpatient Medications  Medication Sig Dispense Refill   acetaminophen (TYLENOL) 500 MG tablet Take 1 tablet (500 mg total) by mouth every 6 (six) hours as needed for mild pain, fever or headache. 30 tablet 0   aspirin 81 MG EC tablet Take 1 tablet (81 mg total) by mouth daily with breakfast. Swallow whole. 30 tablet 11   atorvastatin (LIPITOR) 10 MG tablet Take 1 tablet (10 mg total) by mouth daily. 30 tablet 1   calcium carbonate (TUMS - DOSED IN MG ELEMENTAL CALCIUM) 500 MG chewable tablet Chew 1 tablet (200 mg of elemental calcium total) by mouth 3 (three) times daily.       cyanocobalamin (,VITAMIN B-12,) 1000 MCG/ML injection Inject 1,000 mcg into the muscle every 30 (thirty) days.        DULoxetine (CYMBALTA) 30 MG capsule  Take 1 capsule (30 mg total) by mouth 2 (two) times daily. 60 capsule 0   gabapentin (NEURONTIN) 100 MG capsule Take 100 mg by mouth. Take 1 capsule by mouth on Mon,Wed, and Friday for nerve pain. Take after dialysis       hydrOXYzine (ATARAX/VISTARIL) 10 MG tablet Take 1 tablet (10 mg total) by mouth 3 (three) times daily as needed for itching or anxiety. 30 tablet 0   midodrine (PROAMATINE) 10 MG tablet Take 1 tablet (10 mg total) by mouth daily. 90 tablet 0   Multiple Vitamin (MULTIVITAMIN WITH MINERALS) TABS tablet Take 1 tablet by mouth daily. 30 tablet 0   Naloxone HCl (NARCAN NA) Place 4 mg into the nose once. Spray 1 spray into one nostril as directed  ( turn person on side after dose). If no response in 2 to 3 minutes or person responds but relapes a new sprays,repeat   using  a new spray.       pantoprazole (PROTONIX) 20 MG tablet TAKE 1 TABLET (20 MG TOTAL) BY MOUTH 2 (TWO) TIMES DAILY. 15 tablet 0   polycarbophil (FIBERCON) 625 MG tablet Take 1 tablet (625 mg total) by mouth 2 (two) times daily. (Patient not taking: Reported on 10/07/2020) 60 tablet 3   polyethylene glycol (MIRALAX / GLYCOLAX) 17 g packet Take 17 g by mouth daily as needed for moderate constipation. 14 each 0   prazosin (MINIPRESS) 1 MG capsule Take 1 mg by mouth at bedtime.       silodosin (RAPAFLO) 8 MG CAPS capsule Take 1 capsule (8 mg total) by mouth daily with breakfast. 30 capsule 0   sodium bicarbonate 650 MG tablet TAKE 1 TABLET (650 MG TOTAL) BY MOUTH 2 (TWO) TIMES DAILY. 30 tablet 0   tamsulosin (FLOMAX) 0.4 MG CAPS capsule Take 0.4 mg by mouth at bedtime.       Vitamin D3 (VITAMIN D) 25 MCG tablet TAKE 800 UNITS BY MOUTH DAILY. (Patient not taking: Reported on 10/07/2020) 75 tablet 0    No current facility-administered medications for this visit.       ROS:  See HPI   Physical Exam:       Today's Vitals    01/10/21 1525 01/10/21 1528  BP: (!) 110/56    Pulse: 75    Resp: 16    Temp: 98.3 F (36.8 C)    TempSrc: Temporal    SpO2: 97%    Weight: 147 lb 9.6 oz (67 kg)    Height: 5\' 8"  (1.727 m)    PainSc: 7  7   PainLoc: Generalized      Body mass index is 22.44 kg/m.     Incision:  well healed Extremities:   There is a palpable left radial pulse.   Motor and sensory are in tact.   There is a thrill/bruit present.  The fistula is easily palpable     Dialysis Duplex on 01/10/2021: +------------+----------+-------------+----------+--------+  OUTFLOW VEINPSV (cm/s)Diameter (cm)Depth (cm)Describe  +------------+----------+-------------+----------+--------+  Prox UA         61        0.99        0.45             +------------+----------+-------------+----------+--------+  Mid UA          57        1.08        0.55              +------------+----------+-------------+----------+--------+  Dist UA  167        1.05        0.57             +------------+----------+-------------+----------+--------+  AC Fossa       349        0.71        0.16             +------------+----------+-------------+----------+--------+      Assessment/Plan:  This is a 55 y.o. male who is s/p: left 1st stage BVT and insertion of right TDC (19cm) on 10/16/2020 by Dr. Carlis Abbott.     -the pt does not have evidence of steal. -the fistula has matured nicely and will need a second stage BVT.  Will schedule this on a non dialysis day and with Dr. Carlis Abbott.  I discussed this with the pt that complications can include but not limited to nerve damage, bleeding.  Also discussed that the incisions would have to go over his tattoo on the inner arm.  He expressed understanding. -If pt has a tunneled dialysis catheter and the access has been used successfully to the satisfaction of the dialysis center, the tunneled catheter can be scheduled to be removed at their discretion.   -discussed with pt that access does not last forever and will need intervention or even new access at some point.        Leontine Locket, [facility] [phone]   Clinic MD:  Donzetta Matters

## 2021-02-05 NOTE — Anesthesia Preprocedure Evaluation (Signed)
Anesthesia Evaluation  Patient identified by MRN, date of birth, ID band Patient awake    Reviewed: Allergy & Precautions, NPO status , Patient's Chart, lab work & pertinent test results  Airway Mallampati: II  TM Distance: >3 FB Neck ROM: Full    Dental no notable dental hx.    Pulmonary shortness of breath, former smoker,    Pulmonary exam normal breath sounds clear to auscultation       Cardiovascular + Past MI  Normal cardiovascular exam Rhythm:Regular Rate:Normal     Neuro/Psych CVA negative psych ROS   GI/Hepatic Neg liver ROS, GERD  ,  Endo/Other  negative endocrine ROS  Renal/GU DialysisRenal disease  negative genitourinary   Musculoskeletal negative musculoskeletal ROS (+)   Abdominal   Peds negative pediatric ROS (+)  Hematology negative hematology ROS (+)   Anesthesia Other Findings   Reproductive/Obstetrics negative OB ROS                             Anesthesia Physical Anesthesia Plan  ASA: 3  Anesthesia Plan: General   Post-op Pain Management:    Induction: Intravenous  PONV Risk Score and Plan: 2 and Ondansetron, Dexamethasone and Treatment may vary due to age or medical condition  Airway Management Planned: LMA  Additional Equipment:   Intra-op Plan:   Post-operative Plan: Extubation in OR  Informed Consent: I have reviewed the patients History and Physical, chart, labs and discussed the procedure including the risks, benefits and alternatives for the proposed anesthesia with the patient or authorized representative who has indicated his/her understanding and acceptance.     Dental advisory given  Plan Discussed with: CRNA and Surgeon  Anesthesia Plan Comments:         Anesthesia Quick Evaluation

## 2021-02-05 NOTE — Op Note (Signed)
    OPERATIVE NOTE   PROCEDURE: left second stage basilic vein transposition (brachiobasilic arteriovenous fistula) placement  PRE-OPERATIVE DIAGNOSIS: ESRD  POST-OPERATIVE DIAGNOSIS: same  SURGEON: [name], MD  ASSISTANT(S): Roxy Horseman, PA  ANESTHESIA:  LMA  ESTIMATED BLOOD LOSS: <50 mL  FINDING(S): The left upper extremity basilic vein was mobilized through three skip incisions.  This was transposed through a new skin tunnel after it was transected near the antecubital fossa.  A new end-to-end primary anastomosis was performed.  Excellent thrill at completion.  An assistant was needed for exposure and to expedite the case.  SPECIMEN(S):  None  INDICATIONS:   Chase [name] is a 55 y.o. male who presents with ESRD and need for permanent hemodialysis access.  The patient is scheduled for left second stage basilic vein transposition.  The patient is aware the risks include but are not limited to: bleeding, infection, steal syndrome, nerve damage, ischemic monomelic neuropathy, failure to mature, and need for additional procedures.  The patient is aware of the risks of the procedure and elects to proceed forward.   DESCRIPTION: After full informed written consent was obtained from the patient, the patient was brought back to the operating room and placed supine upon the operating table.  Prior to induction, the patient received IV antibiotics.   After obtaining adequate anesthesia, the patient was then prepped and draped in the standard fashion for a left arm access procedure.  I turned my attention first to identifying the patient's brachiobasilic arteriovenous fistula.  Using SonoSite guidance, the location of this fistula was marked out on the skin.    This was an excellent caliber vein.  I made three longitudinal incisions on the medial aspect of the left upper arm.  Through these incisions I dissected out circumferentially the basilic vein, taking care to  protect the nerve.  Once the vein was fully mobilized, all side branches were ligated between silk ties.  The vein was marked for orientation.  I then used a curved tunneler to create a subcutaneous tunnel.  The vein was then transected near the antecubital crease.  It was then brought to the previously created tunnel making sure to maintain proper orientation.  The patient was given 3,000 units IV heparin.  A new primary anastomosis was then performed between the two cut ends of the vein with a running 6-0 Prolene.  Once this was done the clamps were released.  There was excellent flow through the fistula.  Hemostasis was then achieved.  The wound was irrigated.  The incision was closed with a deep layer of 3-0 Vicryl followed by a subcutaneous 4-0 Monocryl and Dermabond.  There were no immediate complications.  COMPLICATIONS: None  CONDITION: Stable  [name], MD [facility] Office: [place]   02/05/2021, 10:50 AM

## 2021-02-05 NOTE — Transfer of Care (Signed)
Immediate Anesthesia Transfer of Care Note  Patient: Chase [name]  Procedure(s) Performed: LEFT SECOND STAGE BASILIC VEIN TRANSPOSITION (Left: Arm Upper)  Patient Location: PACU  Anesthesia Type:General  Level of Consciousness: drowsy, patient cooperative and responds to stimulation  Airway & Oxygen Therapy: Patient Spontanous Breathing and Patient connected to nasal cannula oxygen  Post-op Assessment: Report given to RN and Post -op Vital signs reviewed and stable  Post vital signs: Reviewed and stable  Last Vitals:  Vitals Value Taken Time  BP 117/72 02/05/21 1044  Temp    Pulse 69 02/05/21 1046  Resp 6 02/05/21 1046  SpO2 98 % 02/05/21 1046  Vitals shown include unvalidated device data.  Last Pain:  Vitals:   02/05/21 0748  TempSrc:   PainSc: 7       Patients Stated Pain Goal: 0 (18/98/42 1031)  Complications: No notable events documented.

## 2021-02-05 NOTE — Progress Notes (Signed)
Patient's ride is not longer available. Patient waiting on different ride. He no longer requires monitoring.

## 2021-02-05 NOTE — Anesthesia Procedure Notes (Signed)
Procedure Name: LMA Insertion Date/Time: 02/05/2021 9:20 AM Performed by: Cathren Harsh, CRNA Pre-anesthesia Checklist: Patient identified, Emergency Drugs available, Suction available and Patient being monitored Patient Re-evaluated:Patient Re-evaluated prior to induction Oxygen Delivery Method: Circle System Utilized Preoxygenation: Pre-oxygenation with 100% oxygen Induction Type: IV induction Ventilation: Mask ventilation without difficulty LMA: LMA inserted LMA Size: 4.0 Number of attempts: 1 Airway Equipment and Method: Bite block Placement Confirmation: positive ETCO2 Tube secured with: Tape Dental Injury: Teeth and Oropharynx as per pre-operative assessment

## 2021-02-05 NOTE — Progress Notes (Addendum)
After waiting for ride all day, at approximately 1845 patient stated his ride is here. Patient was wheeled out by staff to the urgent care parking lot where the car supposedly was. When staff got to the parking lot with the patient, no ride was there. Patient states that his friend must be at the wrong hospital. Patient refused to come back into the hospital and wait for ride and insisted on driving home. Anesthesia was called for guidance and indicated that we have the patient sign an AMA form. AMA form was filled out and patient left. Hospital offered transportation but patient refused because he did not want to leave his car.   Gabriel Rainwater, RN

## 2021-02-05 NOTE — Anesthesia Postprocedure Evaluation (Signed)
Anesthesia Post Note  Patient: Chase [name]  Procedure(s) Performed: LEFT SECOND STAGE BASILIC VEIN TRANSPOSITION (Left: Arm Upper)     Patient location during evaluation: PACU Anesthesia Type: General Level of consciousness: awake and alert Pain management: pain level controlled Vital Signs Assessment: post-procedure vital signs reviewed and stable Respiratory status: spontaneous breathing, nonlabored ventilation, respiratory function stable and patient connected to nasal cannula oxygen Cardiovascular status: blood pressure returned to baseline and stable Postop Assessment: no apparent nausea or vomiting Anesthetic complications: no   No notable events documented.  Last Vitals:  Vitals:   02/05/21 1100 02/05/21 1115  BP: 122/70 117/75  Pulse: 70 70  Resp: 11 12  Temp:  (!) 36.3 C  SpO2: 96% 95%    Last Pain:  Vitals:   02/05/21 1115  TempSrc:   PainSc: 5                  Ayari Liwanag S

## 2021-02-06 ENCOUNTER — Encounter (HOSPITAL_COMMUNITY): Payer: Self-pay | Admitting: Vascular Surgery

## 2021-03-06 ENCOUNTER — Encounter: Payer: Self-pay | Admitting: Physician Assistant

## 2021-03-06 ENCOUNTER — Other Ambulatory Visit: Payer: Self-pay

## 2021-03-06 ENCOUNTER — Ambulatory Visit (INDEPENDENT_AMBULATORY_CARE_PROVIDER_SITE_OTHER): Payer: No Typology Code available for payment source | Admitting: Physician Assistant

## 2021-03-06 VITALS — BP 125/66 | HR 71 | Temp 97.5°F | Resp 20 | Ht 68.0 in | Wt 183.5 lb

## 2021-03-06 DIAGNOSIS — N186 End stage renal disease: Secondary | ICD-10-CM

## 2021-03-06 DIAGNOSIS — Z992 Dependence on renal dialysis: Secondary | ICD-10-CM

## 2021-03-06 NOTE — Progress Notes (Signed)
POST OPERATIVE OFFICE NOTE    CC:  F/u for surgery  HPI:  This is a 55 y.o. male who is s/p left UE second stage basilic vein fistula  on 02/05/21 by Dr. Carlis Abbott.  He had previous surgery to include: Left first stage basilic fistula and right TDC by Dr. Carlis Abbott on 10/16/20.  He has hx of CVA with left hemiparesis.  He has HD MWF in [place]. Pt returns today for follow up.  Pt states he has a few small areas of decreased sensation in the forearm, not to include the hand.  He denise pain, loss of hand sensation and loss of motor.   Allergies  Allergen Reactions   Nsaids Other (See Comments)    Bleeding GI Ulceration   Ciprofloxacin Nausea And Vomiting    Current Outpatient Medications  Medication Sig Dispense Refill   acetaminophen (TYLENOL) 500 MG tablet Take 1 tablet (500 mg total) by mouth every 6 (six) hours as needed for mild pain, fever or headache. (Patient taking differently: Take 500 mg by mouth 3 (three) times daily as needed for mild pain, fever or headache.) 30 tablet 0   amitriptyline (ELAVIL) 10 MG tablet Take 10 mg by mouth at bedtime.     aspirin 81 MG EC tablet Take 1 tablet (81 mg total) by mouth daily with breakfast. Swallow whole. (Patient not taking: Reported on 03/06/2021) 30 tablet 11   atorvastatin (LIPITOR) 10 MG tablet Take 1 tablet (10 mg total) by mouth daily. (Patient not taking: Reported on 01/10/2021) 30 tablet 1   calcium carbonate (TUMS - DOSED IN MG ELEMENTAL CALCIUM) 500 MG chewable tablet Chew 1 tablet (200 mg of elemental calcium total) by mouth 3 (three) times daily. (Patient not taking: Reported on 03/06/2021)     cyanocobalamin (,VITAMIN B-12,) 1000 MCG/ML injection Inject 1,000 mcg into the muscle every 30 (thirty) days.      DULoxetine (CYMBALTA) 30 MG capsule Take 1 capsule (30 mg total) by mouth 2 (two) times daily. (Patient not taking: Reported on 01/17/2021) 60 capsule 0   gabapentin (NEURONTIN) 100 MG capsule Take 200 mg by mouth See admin  instructions. Take 2 capsules by mouth on Tues, Thurs, and Sat for nerve pain. Take after dialysis     hydrOXYzine (ATARAX/VISTARIL) 10 MG tablet Take 1 tablet (10 mg total) by mouth 3 (three) times daily as needed for itching or anxiety. 30 tablet 0   midodrine (PROAMATINE) 10 MG tablet Take 1 tablet (10 mg total) by mouth daily. 90 tablet 0   Multiple Vitamin (MULTIVITAMIN WITH MINERALS) TABS tablet Take 1 tablet by mouth daily. 30 tablet 0   Naloxone HCl (NARCAN NA) Place 4 mg into the nose once. Spray 1 spray into one nostril as directed  ( turn person on side after dose). If no response in 2 to 3 minutes or person responds but relapes a new sprays,repeat  using  a new spray.     OVER THE COUNTER MEDICATION Take 300 mg by mouth daily. Omega XL     oxyCODONE-acetaminophen (PERCOCET/ROXICET) 5-325 MG tablet Take 1 tablet by mouth every 6 (six) hours as needed. (Patient not taking: Reported on 03/06/2021) 30 tablet 0   pantoprazole (PROTONIX) 20 MG tablet TAKE 1 TABLET (20 MG TOTAL) BY MOUTH 2 (TWO) TIMES DAILY. 15 tablet 0   polycarbophil (FIBERCON) 625 MG tablet Take 1 tablet (625 mg total) by mouth 2 (two) times daily. (Patient not taking: No sig reported) 60 tablet 3  polyethylene glycol (MIRALAX / GLYCOLAX) 17 g packet Take 17 g by mouth daily as needed for moderate constipation. (Patient not taking: Reported on 01/10/2021) 14 each 0   prazosin (MINIPRESS) 1 MG capsule Take 1 mg by mouth at bedtime.     silodosin (RAPAFLO) 8 MG CAPS capsule Take 1 capsule (8 mg total) by mouth daily with breakfast. (Patient not taking: No sig reported) 30 capsule 0   sodium bicarbonate 650 MG tablet TAKE 1 TABLET (650 MG TOTAL) BY MOUTH 2 (TWO) TIMES DAILY. (Patient not taking: Reported on 01/10/2021) 30 tablet 0   tamsulosin (FLOMAX) 0.4 MG CAPS capsule Take 0.4 mg by mouth at bedtime.     Vitamin D3 (VITAMIN D) 25 MCG tablet TAKE 800 UNITS BY MOUTH DAILY. (Patient not taking: Reported on 10/07/2020) 75 tablet 0    No current facility-administered medications for this visit.     ROS:  See HPI  Physical Exam:    Incision:  well healed incisions Extremities:  grip equal B, radial pulse palpable and palpable thrill throughout the fistula with good viability Neuro: sensation     Assessment/Plan:  This is a 55 y.o. male who is s/p:This is a 55 y.o. male who is s/p left UE second stage basilic vein fistula  on 02/05/21 by Dr. Carlis Abbott.  He had previous surgery to include: Left first stage basilic fistula and right TDC by Dr. Carlis Abbott on 10/16/20.  The fistula is ready to use for HD at this time.  Once the fistula is working well, [facility].  F/U PRN   Roxy Horseman PA-C [facility] [phone]   Clinic MD:  Carlis Abbott

## 2021-04-24 ENCOUNTER — Ambulatory Visit: Payer: Self-pay | Admitting: Surgery

## 2021-04-24 DIAGNOSIS — R739 Hyperglycemia, unspecified: Secondary | ICD-10-CM

## 2022-01-03 ENCOUNTER — Other Ambulatory Visit: Payer: Self-pay | Admitting: Urology

## 2022-01-14 NOTE — Progress Notes (Signed)
COVID Vaccine Completed:  Date of COVID positive in last 90 days:  PCP - [place] Cardiologist -   Chest x-ray -  EKG -  Stress Test -  ECHO - 10/08/20 Epic Cardiac Cath - 06/09/18 CE Pacemaker/ICD device last checked: Spinal Cord Stimulator:  Bowel Prep -   Sleep Study -  CPAP -   Fasting Blood Sugar -  Checks Blood Sugar _____ times a day  Blood Thinner Instructions: Aspirin Instructions: Last Dose:  Activity level:  Can go up a flight of stairs and perform activities of daily living without stopping and without symptoms of chest pain or shortness of breath.  Able to exercise without symptoms  Unable to go up a flight of stairs without symptoms of     Anesthesia review: NSTEMI, CKD, CVA, ESRD on dialysis. Left hemiparesis   Patient denies shortness of breath, fever, cough and chest pain at PAT appointment  Patient verbalized understanding of instructions that were given to them at the PAT appointment. Patient was also instructed that they will need to review over the PAT instructions again at home before surgery.

## 2022-01-14 NOTE — Patient Instructions (Addendum)
SURGICAL WAITING ROOM VISITATION Patients having surgery or a procedure may have no more than 2 support people in the waiting area - these visitors may rotate.   Children under the age of 63 must have an adult with them who is not the patient. If the patient needs to stay at the hospital during part of their recovery, the visitor guidelines for inpatient rooms apply. Pre-op nurse will coordinate an appropriate time for 1 support person to accompany patient in pre-op.  This support person may not rotate.    Please refer to the [facility] website for the visitor guidelines for Inpatients (after your surgery is over and you are in a regular room).    Your procedure is scheduled on: 01/24/22   Report to [facility] Main Entrance    Report to admitting at 7:00 AM   Call this number if you have problems the morning of surgery [phone]   Do not eat food or drink liquids :After Midnight.         If you have questions, please contact your surgeon's office.   FOLLOW BOWEL PREP AND ANY ADDITIONAL PRE OP INSTRUCTIONS YOU RECEIVED FROM YOUR SURGEON'S OFFICE!!!     Oral Hygiene is also important to reduce your risk of infection.                                    Remember - BRUSH YOUR TEETH THE MORNING OF SURGERY WITH YOUR REGULAR TOOTHPASTE   Take these medicines the morning of surgery with A SIP OF WATER: Tylenol, Hydroxyzine, Midodrine, Pantoprazole                               You may not have any metal on your body including jewelry, and body piercing             Do not wear lotions, powders, cologne, or deodorant              Men may shave face and neck.   Do not bring valuables to the hospital. [place].   Contacts, dentures or bridgework may not be worn into surgery.   Bring small overnight bag day of surgery.   DO NOT [place]. PHARMACY WILL DISPENSE MEDICATIONS LISTED ON YOUR  MEDICATION LIST TO YOU DURING YOUR ADMISSION [place]!    Special Instructions: Bring a copy of your healthcare power of attorney and living will documents the day of surgery if you haven't scanned them before.              Please read over the following fact sheets you were given: IF [place] [phone] Schneiders   If you received a COVID test during your pre-op visit  it is requested that you wear a mask when out in public, stay away from anyone that may not be feeling well and notify your surgeon if you develop symptoms. If you test positive for Covid or have been in contact with anyone that has tested positive in the last 10 days please notify you surgeon.     [place] - Preparing for Surgery Before surgery, you can play an important role.  Because skin  is not sterile, your skin needs to be as free of germs as possible.  You can reduce the number of germs on your skin by washing with CHG (chlorahexidine gluconate) soap before surgery.  CHG is an antiseptic cleaner which kills germs and bonds with the skin to continue killing germs even after washing. Please DO NOT use if you have an allergy to CHG or antibacterial soaps.  If your skin becomes reddened/irritated stop using the CHG and inform your nurse when you arrive at Short Stay. Do not shave (including legs and underarms) for at least 48 hours prior to the first CHG shower.  You may shave your face/neck.  Please follow these instructions carefully:  1.  Shower with CHG Soap the night before surgery and the  morning of surgery.  2.  If you choose to wash your hair, wash your hair first as usual with your normal  shampoo.  3.  After you shampoo, rinse your hair and body thoroughly to remove the shampoo.                             4.  Use CHG as you would any other liquid soap.  You can apply chg directly to the skin and wash.  Gently with a scrungie or clean washcloth.  5.  Apply  the CHG Soap to your body ONLY FROM THE NECK DOWN.   Do   not use on face/ open                           Wound or open sores. Avoid contact with eyes, ears mouth and   genitals (private parts).                       Wash face,  Genitals (private parts) with your normal soap.             6.  Wash thoroughly, paying special attention to the area where your    surgery  will be performed.  7.  Thoroughly rinse your body with warm water from the neck down.  8.  DO NOT shower/wash with your normal soap after using and rinsing off the CHG Soap.                9.  Pat yourself dry with a clean towel.            10.  Wear clean pajamas.            11.  Place clean sheets on your bed the night of your first shower and do not  sleep with pets. Day of Surgery : Do not apply any lotions/deodorants the morning of surgery.  Please wear clean clothes to the hospital/surgery center.  FAILURE TO FOLLOW THESE INSTRUCTIONS MAY RESULT IN THE CANCELLATION OF YOUR SURGERY  PATIENT SIGNATURE_________________________________  NURSE SIGNATURE__________________________________  ________________________________________________________________________

## 2022-01-15 ENCOUNTER — Encounter (HOSPITAL_COMMUNITY)
Admission: RE | Admit: 2022-01-15 | Discharge: 2022-01-15 | Disposition: A | Discharge status: Home / Self Care | Payer: No Typology Code available for payment source | Source: Ambulatory Visit | Attending: Urology | Admitting: Urology

## 2022-01-15 ENCOUNTER — Encounter (HOSPITAL_COMMUNITY): Payer: Self-pay

## 2022-01-15 VITALS — BP 137/69 | HR 74 | Temp 98.4°F | Resp 16 | Ht 68.0 in | Wt 187.4 lb

## 2022-01-15 DIAGNOSIS — Z01818 Encounter for other preprocedural examination: Secondary | ICD-10-CM | POA: Diagnosis present

## 2022-01-15 DIAGNOSIS — N186 End stage renal disease: Secondary | ICD-10-CM | POA: Insufficient documentation

## 2022-01-15 HISTORY — DX: Unspecified osteoarthritis, unspecified site: M19.90

## 2022-01-15 LAB — CBC
HCT: 35.5 % — ABNORMAL LOW (ref 39.0–52.0)
Hemoglobin: 11.7 g/dL — ABNORMAL LOW (ref 13.0–17.0)
MCH: 32.6 pg (ref 26.0–34.0)
MCHC: 33 g/dL (ref 30.0–36.0)
MCV: 98.9 fL (ref 80.0–100.0)
Platelets: 213 10*3/uL (ref 150–400)
RBC: 3.59 MIL/uL — ABNORMAL LOW (ref 4.22–5.81)
RDW: 14.2 % (ref 11.5–15.5)
WBC: 5.2 10*3/uL (ref 4.0–10.5)
nRBC: 0 % (ref 0.0–0.2)

## 2022-01-15 LAB — BASIC METABOLIC PANEL
Anion gap: 13 (ref 5–15)
BUN: 25 mg/dL — ABNORMAL HIGH (ref 6–20)
CO2: 24 mmol/L (ref 22–32)
Calcium: 8.2 mg/dL — ABNORMAL LOW (ref 8.9–10.3)
Chloride: 100 mmol/L (ref 98–111)
Creatinine, Ser: 5.62 mg/dL — ABNORMAL HIGH (ref 0.61–1.24)
GFR, Estimated: 11 mL/min — ABNORMAL LOW (ref >60)
Glucose, Bld: 101 mg/dL — ABNORMAL HIGH (ref 70–99)
Potassium: 3.5 mmol/L (ref 3.5–5.1)
Sodium: 137 mmol/L (ref 135–145)

## 2022-01-16 NOTE — Anesthesia Preprocedure Evaluation (Addendum)
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   Neuro/Psych  Headaches, PSYCHIATRIC DISORDERS Anxiety Depression PTSDLeft sided weakness  Neuromuscular disease CVA, Residual Symptoms    GI/Hepatic PUD, GERD  Medicated,S/P Gastric Bypass 08/24/19  Hx/o colovesical fistula with spontaneous closure  S/P diverting colostomy   Endo/Other    Renal/GU ESRF and DialysisRenal diseaseDialysis M-W-F  Hx/o bilateral hydronephrosis S/P multiple urologic procedures including percutaneous nephrostomies   BPH with urinary retention, indwelling Foley catheter changed q4-6 weeks Bladder outlet obstruction    Musculoskeletal  (+) Arthritis , Osteoarthritis,  Cervical radiculopathy    Abdominal   Peds  Hematology  (+) Blood dyscrasia, anemia , Hx/o Hodgkin's lymphoma S/P chemoRx and RT   Anesthesia Other Findings   Reproductive/Obstetrics                           Anesthesia Physical Anesthesia Plan  ASA: 4  Anesthesia Plan: General   Post-op Pain Management: Precedex and Dilaudid IV   Induction: Intravenous and Cricoid pressure planned  PONV Risk Score and Plan: 4 or greater and Treatment may vary due to age or medical condition, Ondansetron and Dexamethasone  Airway Management Planned: Oral ETT  Additional Equipment: None  Intra-op Plan:    Post-operative Plan: Extubation in OR  Informed Consent: I have reviewed the patients History and Physical, chart, labs and discussed the procedure including the risks, benefits and alternatives for the proposed anesthesia with the patient or authorized representative who has indicated his/her understanding and acceptance.     Dental advisory given  Plan Discussed with: CRNA and Anesthesiologist  Anesthesia Plan Comments: (See PAT note 01/15/2022)      Anesthesia Quick Evaluation

## 2022-01-16 NOTE — Progress Notes (Signed)
Anesthesia Chart Review   Case: [id number] Date/Time: 01/24/22 0900   Procedure: TRANSURETHRAL RESECTION OF THE PROSTATE (TURP)   Anesthesia type: General   Pre-op diagnosis: URINARY RETENTION   Location: [place] / [facility]   Surgeons: Raynelle Bring, MD       DISCUSSION:56 y.o. 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 Raynelle Bring.   Myocardial Perfusion 01/10/2022 with no inducible ischemia.   Clearance from cardiology received.  VS: BP 137/69   Pulse 74   Temp 36.9 C (Oral)   Resp 16   Ht '5\' 8"'$  (1.727 m)   Wt 85 kg   SpO2 98%   BMI 28.49 kg/m   PROVIDERS: Clinic, Lanny Hurst, MD is Cardiologist  LABS:  Labs DOS (all labs ordered are listed, but only abnormal results are displayed)  Labs Reviewed  BASIC METABOLIC PANEL - Abnormal; Notable for the following components:      Result Value   Glucose, Bld 101 (*)    BUN 25 (*)    Creatinine, Ser 5.62 (*)    Calcium 8.2 (*)    GFR, Estimated 11 (*)    All other components within normal limits  CBC - Abnormal; Notable for the following components:   RBC 3.59 (*)    Hemoglobin 11.7 (*)    HCT 35.5 (*)    All other components within normal limits     IMAGES:   EKG:   CV: Echo 08/14/2021 Left ventricular systolic function is normal.  Ejection Fraction = >55% No regional wall motion abnormalities noted The right ventricular systolic function is normal.  There is trace tricuspid regurgitation RV systolic pressure estimated to be 35-30 mmHg.   Echo 08/24/2019 Summary:  1. The study quality is technically difficult, however, Definity used to  enhance left ventricular visualization and quality.  2. Technically difficult despite the use of Definity contrast. Possibly  moderate to severe left  ventricular dysfunction.  3. Abnormal left ventricular diastolic filling is observed, consistent  with impaired LV relaxation(Stage I).  4. The right ventricular global systolic function is mild to moderately  reduced.  5. There is a small pericardial effusion.  6. No evidence of tamponade.  7. Poorly visualized cardiac valves.  Past Medical History:  Diagnosis Date   Anemia    Arthritis    Cervical radiculopathy    with left sided weakness   Chemical exposure    service related injury   Chronic pain    CKD (chronic kidney disease), stage IV ([place]) 10/17/2019   COVID 2020   COVID-19    Dyspnea    GERD (gastroesophageal reflux disease)    GIB (gastrointestinal bleeding)    Headache    Heart attack ([place])    History of CVA (cerebrovascular accident) 12/31/2019   History of gastric bypass 08/24/2019   History of stomach ulcers 05/19/2013   Formatting of this note might be different from the original. Attributed to chronic ibuprofen use   Hodgkin lymphoma, unspecified, unspecified site ([place]) 12/10/2012   Formatting of this note might be different from the original. In remission.   Hypokalemia    Hypomagnesemia    Interatrial cardiac shunt    06/09/18 agitated bubble study ([facility]): moderate right-to-left interatrial shunt   Pneumonia    walking pneumonia   PTSD (post-traumatic stress disorder)  Renal disorder    Dialysis T/Th/Sa   Stroke [facility])    Urinary retention    due to bladder outlet obstruction    Past Surgical History:  Procedure Laterality Date   AV FISTULA PLACEMENT Left 10/16/2020   Procedure: LEFT ARM ARTERIOVENOUS BRACHIOBASILIC FISTULA CREATION;  Surgeon: [name], MD;  Location: [place];  Service: Vascular;  Laterality: Left;   [place] Left 02/05/2021   Procedure: LEFT SECOND STAGE BASILIC VEIN TRANSPOSITION;  Surgeon: [name], MD;  Location: [place];  Service: Vascular;  Laterality: Left;   CHOLECYSTECTOMY      COLONOSCOPY     COLONOSCOPY WITH PROPOFOL N/A 04/18/2020   Procedure: COLONOSCOPY WITH PROPOFOL;  Surgeon: Doran Stabler, MD;  Location: [place];  Service: Gastroenterology;  Laterality: N/A;   CYSTOSCOPY W/ URETERAL STENT PLACEMENT N/A 11/14/2019   Procedure: CYSTOSCOPY WITH RETROGRADE PYELOGRAM bilateral Wyvonnia Dusky STENT PLACEMENT left fulguration bladder . cystogram;  Surgeon: Raynelle Bring, MD;  [facility];  Service: Urology;  Laterality: N/A;   CYSTOSCOPY W/ URETERAL STENT PLACEMENT Bilateral 12/25/2019   Procedure: CYSTOSCOPY WITH bilateral  RETROGRADE PYELOGRAM/ leftURETERAL STENT PLACEMENT;  Surgeon: Lucas Mallow, MD;  [facility];  Service: Urology;  Laterality: Bilateral;   CYSTOSCOPY W/ URETERAL STENT PLACEMENT Bilateral 07/06/2020   Procedure: CYSTOSCOPY WITH BILATERAL STENT EXCHANGE; BILATERAL PYELOGRAM RETROGRADE;  Surgeon: Raynelle Bring, MD;  [facility];  Service: Urology;  Laterality: Bilateral;   CYSTOSCOPY WITH URETEROSCOPY AND STENT PLACEMENT N/A 04/18/2020   Procedure: CYSTOSCOPY WITH BILATERAL URETERAL STENT CHANGE PLACEMENT;  Surgeon: Ardis Hughs, MD;  Location: [place];  Service: Urology;  Laterality: N/A;   EPIGASTRIC HERNIA REPAIR N/A 04/18/2020   Procedure: HERNIA REPAIR EPIGASTRIC ADULT;  Surgeon: Michael Boston, MD;  Location: [place];  Service: General;  Laterality: N/A;   GASTRIC BYPASS     HERNIA REPAIR     INSERTION OF DIALYSIS CATHETER Right 10/16/2020   Procedure: INSERTION OF RIGHT INTERNAL JUGULAR TUNNELED DIALYSIS CATHETER WITH ULTRASOUND GUIDANCE;  Surgeon: [name], MD;  Location: [place];  Service: Vascular;  Laterality: Right;   IR FLUORO GUIDE CV LINE RIGHT  04/01/2020   IR NEPHRO TUBE REMOV/FL  04/25/2020   IR NEPHRO TUBE REMOV/FL  10/23/2020   IR NEPHRO TUBE REMOV/FL  10/23/2020   IR NEPHROSTOMY PLACEMENT LEFT  12/27/2019   IR NEPHROSTOMY PLACEMENT LEFT  04/01/2020   IR NEPHROSTOMY PLACEMENT LEFT  10/08/2020    IR NEPHROSTOMY PLACEMENT RIGHT  11/16/2019   IR NEPHROSTOMY PLACEMENT RIGHT  12/27/2019   IR NEPHROSTOMY PLACEMENT RIGHT  04/01/2020   IR NEPHROSTOMY PLACEMENT RIGHT  10/08/2020   IR SINUS/FIST TUBE CHK-NON GI  12/03/2019   IR URETERAL STENT PLACEMENT EXISTING ACCESS RIGHT  12/08/2019   IR URETERAL STENT PLACEMENT EXISTING ACCESS RIGHT  01/26/2020   IR US GUIDANCE  10/08/2020   IR US GUIDANCE  10/08/2020   IR US GUIDE VASC ACCESS RIGHT  04/01/2020   LAPAROSCOPIC LOOP COLOSTOMY N/A 04/18/2020   Procedure: LAPAROSCOPIC SIGMOID COLOTOMY WITH COLOSTOMY;  Surgeon: Michael Boston, MD;  [facility];  Service: General;  Laterality: N/A;    MEDICATIONS:  acetaminophen (TYLENOL) 650 MG CR tablet   amitriptyline (ELAVIL) 10 MG tablet   aspirin 81 MG EC tablet   atorvastatin (LIPITOR) 10 MG tablet   Calcium Acetate 667 MG TABS   calcium carbonate (TUMS - DOSED IN MG ELEMENTAL CALCIUM) 500 MG chewable tablet   cinacalcet (  SENSIPAR) 30 MG tablet   cyanocobalamin (,VITAMIN B-12,) 1000 MCG/ML injection   DULoxetine (CYMBALTA) 30 MG capsule   furosemide (LASIX) 80 MG tablet   gabapentin (NEURONTIN) 100 MG capsule   gabapentin (NEURONTIN) 300 MG capsule   Heparin Sod, Pork, Lock Flush (MONOJECT PREFILL HEPARIN SOD IV)   hydrOXYzine (ATARAX/VISTARIL) 10 MG tablet   midodrine (PROAMATINE) 10 MG tablet   Multiple Vitamin (MULTIVITAMIN WITH MINERALS) TABS tablet   Naloxone HCl (NARCAN NA)   oxyCODONE-acetaminophen (PERCOCET/ROXICET) 5-325 MG tablet   pantoprazole (PROTONIX) 20 MG tablet   polycarbophil (FIBERCON) 625 MG tablet   prazosin (MINIPRESS) 1 MG capsule   silodosin (RAPAFLO) 8 MG CAPS capsule   tamsulosin (FLOMAX) 0.4 MG CAPS capsule   No current facility-administered medications for this encounter.      Konrad Felix Ward, PA-C [facility]-Surgical [facility] [phone]

## 2022-01-23 ENCOUNTER — Encounter (HOSPITAL_COMMUNITY): Payer: Self-pay | Admitting: Urology

## 2022-01-23 NOTE — H&P (Signed)
Office Visit Report     01/01/2022   --------------------------------------------------------------------------------   Chase [name]  MRN: [id number]  DOB: [birth year], 56 year old Male  SSN: [id number]   PRIMARY CARE:    REFERRING:    PROVIDER:  Raynelle Bring, M.D.  LOCATION:  [facility] [phone]     --------------------------------------------------------------------------------   CC/HPI: 1. 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 He also had a history of bilateral hydronephrosis although never was clearly demonstrated to have ureteral obstruction. However, this was managed initially with ureteral stents which have been subsequently removed. He did have nephrostomy tubes as well considering he developed worsening renal function that was felt to likely be from prerenal origin when he presented multiple times with sepsis. Ultimately, he did develop end-stage renal disease and now is receiving hemodialysis Monday, Wednesday, and Friday mornings. He has significantly improved his overall performance status. His weight has increased from approximately 120 pounds up to 180 pounds. He has not yet undergone colostomy reversal as he is waiting for a colonoscopy through the [place]. His urinary retention has been managed with an indwelling Foley catheter that has been changed approximately every 4 to 6 weeks through our office. At this time, he would like to proceed with definitive treatment of his BPH. 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 It was felt that he would benefit from a TURP. At this time, he is interested in addressing some of his medical issues in order to optimize himself for a possible renal transplant. He denies any recent chest  pain or shortness of breath or urinary tract infections clinically. He has been undergoing cardiology evaluation and follow-up through the [facility] and is apparently scheduled for an upcoming stress test in the near future.     ALLERGIES: NSAIDs - Bleeding GI Ulceration    MEDICATIONS: Tamsulosin Hcl 0.4 mg capsule  Aleve 220 mg tablet Oral  Aspirin Ec 81 mg tablet, delayed release 1 tablet PO Daily  B12 Active  Colace 100 mg capsule 1 capsule PO Daily  Cymbalta 30 mg capsule,delayed release  Ensure  Ferrous Sulfate 325 mg (65 mg iron) tablet  Hydrocodone-Acetaminophen 5 mg-325 mg tablet  Hydroxyzine Hcl 10 mg tablet  Midodrine Hcl 2.5 mg tablet  Multivitamins With Minerals  Neurontin 300 mg capsule  Pantoprazole Sodium 40 mg tablet, delayed release  Promethazine Hcl 25 mg tablet     GU PSH: Complex cystometrogram, w/ void pressure and urethral pressure profile studies, any technique - 2022 Complex Uroflow - 2022 Cysto Remove Stent FB Sim - 05/04/2021 Cystoscopy Fulguration - 2021 Cystoscopy Insert Stent, Bilateral - 2022, Left - 2021, Left - 2021 Emg surf Electrd - 2022 Inject For cystogram - 2022 Insert Bladder Cath; Complex - 05/04/2021, 2021 Intrabd voidng Press - 2022       [place] Notes: Pilonidal Cyst Resection, Surgery Testis, Hernia Repair, Neck Surgery, Surg For Morbid Obesity Open Gastric Restrictive Procedure, Cholecystectomy Laparoscopic   NON-GU PSH: Cholecystectomy (laparoscopic) - 2010 Hernia Repair - 2010 Remove Pilonidal Lesion - 2010     GU PMH: Urinary Retention - 12/25/2021, - 11/13/2021, - 10/09/2021, - 09/04/2021, - 08/07/2021, - 07/03/2021, - 05/04/2021, - 03/27/2021, - 02/02/2021, - 08/24/2020, - 07/25/2020, - 2022, - 2022, - 03/20/2020, - 03/20/2020, - 2021 Hydronephrosis - 07/03/2021, -  05/04/2021, - 08/24/2020, - 07/25/2020, - 2022, - 03/20/2020, - 2021 Splitting of urinary stream - 06/07/2021, Intermittent urinary stream, - 2014 BPH w/LUTS -  08/24/2020 Vesicointestinal fistula - 2021 Urinary Frequency, Increased urinary frequency - 2014 Weak Urinary Stream, Weak urinary stream - 2014      PMH Notes:   1) Colovesical fistula / pelvic abscess: He presented with a storke and urinary retention to [facility] in July 2021. He was discharged to a SNF and presented to [facility] with sepsis from a GU source. He had E coli and Pseudomonas positive blood and urine cultures. He was noted to have hematuria, bilateral hydronephrosis, and a possible bladder mass along with a baseline Cr of 2.6. He underwent cystoscopic evaluation in August 2021 and was noted to have no bladder mass but did have a posterior fistula that was ultimately confirmed on imaging with a large pelvic abscess. He underwent percutaneous drainage and General Surgery ([name]) was consulted. He was discharged with his drain (to be managed by General Surgery) and his catheter. After multiple attempts to schedule surgery which were postponed due to non-compliance or medical complications, it was ultimately found that his fistula had healed after undergoing imaging.   2) Bilateral hydronephrosis: He presented with bilateral hydronephrosis and Cr of 2.6 as noted above. He underwent bilateral ureteral stent placement. His renal function did not improve. He then was hospitalized with worsening function in 2021 despite his stents. He underwent B PCN placement to optimize drainage. His new baseline Cr was around 4.5 and his PCNs were able to be capped and then removed without change with his stents left indwelling.   Jan 2022: B stent change  Apr 2022: B stent change   3) Urinary retention: This has been long standing and he was managed with an indwelling catheter for over a year due to his other medical comorbidities. He became stable enough to consider a TURP and this was scheduled for July 2022. He did not show for his surgery and was found non-responsive with ARF requiring PCNs  bilaterally and dialysis.   Mar 2022: UDS - Preserved detrusor function with evidence of bladder outlet obstruction    NON-GU PMH: Bacteriuria - 05/04/2021, - 07/25/2020 Sebaceous cyst, Sebaceous cyst - 2014 Anemia in other chronic diseases classified elsewhere GERD History of falling Hypokalemia Hypomagnesemia Peritoneal abscess    FAMILY HISTORY: Colon Cancer - Grandmother Family Health Status - Father alive at age 37 - 14 In Family Family Health Status - Mother's Age - Runs In Family Family Health Status Number - Runs In Family Heart Disease - Father, Grandfather   SOCIAL HISTORY: No Social History     Notes: Previous History Of Smoking, Marital History - Currently Married, Alcohol Use, Occupation:, Caffeine Use   REVIEW OF SYSTEMS:    GU Review Male:   Patient denies frequent urination, hard to postpone urination, burning/ pain with urination, get up at night to urinate, leakage of urine, stream starts and stops, trouble starting your streams, and have to strain to urinate .  Gastrointestinal (Lower):   Patient denies diarrhea and constipation.  Gastrointestinal (Upper):   Patient denies nausea and vomiting.  Constitutional:   Patient denies fever, night sweats, weight loss, and fatigue.  Skin:   Patient denies skin rash/ lesion and itching.  Eyes:   Patient denies blurred vision and double vision.  Ears/ Nose/ Throat:   Patient denies sore throat and sinus problems.  Hematologic/Lymphatic:   Patient denies swollen glands and  easy bruising.  Cardiovascular:   Patient denies leg swelling and chest pains.  Respiratory:   Patient denies cough and shortness of breath.  Endocrine:   Patient denies excessive thirst.  Musculoskeletal:   Patient denies back pain and joint pain.  Neurological:   Patient denies headaches and dizziness.  Psychologic:   Patient denies depression and anxiety.   VITAL SIGNS:      01/01/2022 10:53 AM  Weight 170 lb / 77.11 kg  Height 68 in / 172.72 cm   BP 125/67 mmHg  Pulse 83 /min  Temperature 96.9 F / 36.0 C  BMI 25.8 kg/m   MULTI-SYSTEM PHYSICAL EXAMINATION:    Constitutional: Well-nourished. No physical deformities. Normally developed. Good grooming.  Respiratory: No labored breathing, no use of accessory muscles. Clear bilaterally.  Cardiovascular: Normal temperature, normal extremity pulses, no swelling, no varicosities. Regular rate and rhythm.  Gastrointestinal: His colostomy is pink. He does have stool in his bag. His abdomen is otherwise nontender and soft without masses.     Complexity of Data:  Records Review:   Previous Patient Records  Urodynamics Review:   Review Urodynamics Tests   PROCEDURES:          Urinalysis w/Scope Dipstick Dipstick Cont'd Micro  Color: Yellow Bilirubin: Neg mg/dL WBC/hpf: 40 - 60/hpf  Appearance: Cloudy Ketones: Neg mg/dL RBC/hpf: 0 - 2/hpf  Specific Gravity: 1.010 Blood: 2+ ery/uL Bacteria: Many (>50/hpf)  pH: 6.0 Protein: 1+ mg/dL Cystals: NS (Not Seen)  Glucose: Neg mg/dL Urobilinogen: 0.2 mg/dL Casts: NS (Not Seen)    Nitrites: Neg Trichomonas: Not Present    Leukocyte Esterase: 3+ leu/uL Mucous: Not Present      Epithelial Cells: 0 - 5/hpf      Yeast: NS (Not Seen)      Sperm: Not Present    Notes: QNS for spun micro    ASSESSMENT:      ICD-10 Details  1 GU:   BPH w/LUTS - N40.1   2   Urinary Retention - R33.8    PLAN:           Orders Labs Urine Culture CATH          Schedule Return Visit/Planned Activity: Other See Visit Notes             Note: Will call to schedule surgery          Document Letter(s):  Created for Patient: Clinical Summary         Notes:   1. BPH/urinary retention: His urine will be cultured today. He does wish to proceed with definitive treatment of his bladder outlet obstruction and will be scheduled for a transurethral resection of the prostate. He will be treated with appropriate antibiotics preoperatively. We have reviewed the potential  risks of this procedure including but not limited to, persistent urinary retention, urinary incontinence, urethral stricture formation, erectile dysfunction, regrowth of prostate tissue, etc. He gives his informed consent to proceed. We will obtain appropriate preoperative clearance from his cardiologist especially with his upcoming stress test. Hopefully, he can be scheduled for later this month. Considering the fact that he does receive hemodialysis on Monday, Wednesday, and Friday mornings, we will see if we can schedule him for a Monday afternoon so that he can recover appropriately on Tuesday and then have a normal dialysis section on Wednesday as an outpatient.   CC: Dr. Michael Boston        Next Appointment:      Next Appointment: 01/24/2022  08:00 AM    Appointment Type: Nurse Visit    Location: [facility] [phone]    Provider: NVALL NVALL    Reason for Visit: 30 day f/u      * Signed by Raynelle Bring, M.D. on 01/01/22 at 4:47 PM (EDT)*      The information contained in this medical record document is considered private and confidential patient information

## 2022-01-24 ENCOUNTER — Ambulatory Visit (HOSPITAL_COMMUNITY): Payer: No Typology Code available for payment source | Admitting: Physician Assistant

## 2022-01-24 ENCOUNTER — Observation Stay (HOSPITAL_COMMUNITY)
Admission: RE | Admit: 2022-01-24 | Discharge: 2022-01-25 | Disposition: A | Discharge status: Home / Self Care | Payer: No Typology Code available for payment source | Attending: Urology | Admitting: Urology

## 2022-01-24 ENCOUNTER — Ambulatory Visit (HOSPITAL_BASED_OUTPATIENT_CLINIC_OR_DEPARTMENT_OTHER): Payer: No Typology Code available for payment source | Admitting: Anesthesiology

## 2022-01-24 ENCOUNTER — Encounter (HOSPITAL_COMMUNITY): Payer: Self-pay | Admitting: Urology

## 2022-01-24 ENCOUNTER — Other Ambulatory Visit: Payer: Self-pay

## 2022-01-24 ENCOUNTER — Encounter (HOSPITAL_COMMUNITY)
Admission: RE | Disposition: A | Discharge status: Home / Self Care | Payer: Self-pay | Source: Home / Self Care | Attending: Urology

## 2022-01-24 DIAGNOSIS — Z87891 Personal history of nicotine dependence: Secondary | ICD-10-CM | POA: Insufficient documentation

## 2022-01-24 DIAGNOSIS — N401 Enlarged prostate with lower urinary tract symptoms: Secondary | ICD-10-CM

## 2022-01-24 DIAGNOSIS — N186 End stage renal disease: Secondary | ICD-10-CM | POA: Diagnosis not present

## 2022-01-24 DIAGNOSIS — R339 Retention of urine, unspecified: Secondary | ICD-10-CM | POA: Diagnosis not present

## 2022-01-24 DIAGNOSIS — Z79899 Other long term (current) drug therapy: Secondary | ICD-10-CM | POA: Diagnosis not present

## 2022-01-24 DIAGNOSIS — F418 Other specified anxiety disorders: Secondary | ICD-10-CM | POA: Diagnosis not present

## 2022-01-24 DIAGNOSIS — Z933 Colostomy status: Secondary | ICD-10-CM | POA: Insufficient documentation

## 2022-01-24 DIAGNOSIS — Z7982 Long term (current) use of aspirin: Secondary | ICD-10-CM | POA: Insufficient documentation

## 2022-01-24 DIAGNOSIS — I252 Old myocardial infarction: Secondary | ICD-10-CM

## 2022-01-24 DIAGNOSIS — N320 Bladder-neck obstruction: Secondary | ICD-10-CM | POA: Diagnosis not present

## 2022-01-24 DIAGNOSIS — N400 Enlarged prostate without lower urinary tract symptoms: Secondary | ICD-10-CM | POA: Diagnosis not present

## 2022-01-24 HISTORY — DX: Benign prostatic hyperplasia with lower urinary tract symptoms: N40.1

## 2022-01-24 HISTORY — PX: TRANSURETHRAL RESECTION OF PROSTATE: SHX73

## 2022-01-24 LAB — BASIC METABOLIC PANEL
Anion gap: 14 (ref 5–15)
Anion gap: 11 (ref 5–15)
BUN: 26 mg/dL — ABNORMAL HIGH (ref 6–20)
BUN: 25 mg/dL — ABNORMAL HIGH (ref 6–20)
CO2: 24 mmol/L (ref 22–32)
CO2: 25 mmol/L (ref 22–32)
Calcium: 8.3 mg/dL — ABNORMAL LOW (ref 8.9–10.3)
Calcium: 8.1 mg/dL — ABNORMAL LOW (ref 8.9–10.3)
Chloride: 99 mmol/L (ref 98–111)
Chloride: 101 mmol/L (ref 98–111)
Creatinine, Ser: 5.36 mg/dL — ABNORMAL HIGH (ref 0.61–1.24)
Creatinine, Ser: 5.43 mg/dL — ABNORMAL HIGH (ref 0.61–1.24)
GFR, Estimated: 12 mL/min — ABNORMAL LOW (ref >60)
GFR, Estimated: 11 mL/min — ABNORMAL LOW (ref >60)
Glucose, Bld: 99 mg/dL (ref 70–99)
Glucose, Bld: 120 mg/dL — ABNORMAL HIGH (ref 70–99)
Potassium: 3.3 mmol/L — ABNORMAL LOW (ref 3.5–5.1)
Potassium: 3.5 mmol/L (ref 3.5–5.1)
Sodium: 137 mmol/L (ref 135–145)
Sodium: 137 mmol/L (ref 135–145)

## 2022-01-24 LAB — CBC WITH DIFFERENTIAL/PLATELET
Abs Immature Granulocytes: 0.02 10*3/uL (ref 0.00–0.07)
Basophils Absolute: 0.1 10*3/uL (ref 0.0–0.1)
Basophils Relative: 1 %
Eosinophils Absolute: 0.4 10*3/uL (ref 0.0–0.5)
Eosinophils Relative: 6 %
HCT: 34.1 % — ABNORMAL LOW (ref 39.0–52.0)
Hemoglobin: 11.4 g/dL — ABNORMAL LOW (ref 13.0–17.0)
Immature Granulocytes: 0 %
Lymphocytes Relative: 16 %
Lymphs Abs: 1 10*3/uL (ref 0.7–4.0)
MCH: 33 pg (ref 26.0–34.0)
MCHC: 33.4 g/dL (ref 30.0–36.0)
MCV: 98.8 fL (ref 80.0–100.0)
Monocytes Absolute: 0.5 10*3/uL (ref 0.1–1.0)
Monocytes Relative: 9 %
Neutro Abs: 4.2 10*3/uL (ref 1.7–7.7)
Neutrophils Relative %: 68 %
Platelets: 183 10*3/uL (ref 150–400)
RBC: 3.45 MIL/uL — ABNORMAL LOW (ref 4.22–5.81)
RDW: 13.7 % (ref 11.5–15.5)
WBC: 6.1 10*3/uL (ref 4.0–10.5)
nRBC: 0 % (ref 0.0–0.2)

## 2022-01-24 SURGERY — TURP (TRANSURETHRAL RESECTION OF PROSTATE)
Anesthesia: General | Site: Prostate

## 2022-01-24 MED ORDER — FUROSEMIDE 40 MG PO TABS
80.0000 mg | ORAL_TABLET | Freq: Every day | ORAL | Status: DC
  Administered 2022-01-25: 80 mg via ORAL
  Filled 2022-01-24: qty 2

## 2022-01-24 MED ORDER — MIDODRINE HCL 5 MG PO TABS
10.0000 mg | ORAL_TABLET | Freq: Every day | ORAL | Status: DC
  Administered 2022-01-24 – 2022-01-25 (×2): 10 mg via ORAL
  Filled 2022-01-24 (×2): qty 2

## 2022-01-24 MED ORDER — SODIUM CHLORIDE 0.9 % IR SOLN
Status: DC | PRN
  Administered 2022-01-24: 6000 mL
  Administered 2022-01-24: 3000 mL
  Administered 2022-01-24: 6000 mL

## 2022-01-24 MED ORDER — OXYCODONE HCL 5 MG PO TABS
ORAL_TABLET | ORAL | Status: AC
  Filled 2022-01-24: qty 1

## 2022-01-24 MED ORDER — SODIUM CHLORIDE 0.9% FLUSH
3.0000 mL | Freq: Two times a day (BID) | INTRAVENOUS | Status: DC
  Administered 2022-01-24: 3 mL via INTRAVENOUS

## 2022-01-24 MED ORDER — ONDANSETRON HCL 4 MG/2ML IJ SOLN: 4.0000 mg | INTRAMUSCULAR | Status: DC | PRN

## 2022-01-24 MED ORDER — PROPOFOL 10 MG/ML IV BOLUS
INTRAVENOUS | Status: AC
  Filled 2022-01-24: qty 20

## 2022-01-24 MED ORDER — PRAZOSIN HCL 1 MG PO CAPS
1.0000 mg | ORAL_CAPSULE | Freq: Every day | ORAL | Status: DC
  Administered 2022-01-24: 1 mg via ORAL
  Filled 2022-01-24: qty 1

## 2022-01-24 MED ORDER — CALCIUM CARBONATE ANTACID 500 MG PO CHEW
1.0000 | CHEWABLE_TABLET | Freq: Three times a day (TID) | ORAL | Status: DC
  Administered 2022-01-24 (×2): 200 mg via ORAL
  Filled 2022-01-24 (×3): qty 1

## 2022-01-24 MED ORDER — ORAL CARE MOUTH RINSE: 15.0000 mL | Freq: Once | OROMUCOSAL | Status: AC

## 2022-01-24 MED ORDER — OXYCODONE HCL 5 MG/5ML PO SOLN: 5.0000 mg | Freq: Once | ORAL | Status: AC | PRN

## 2022-01-24 MED ORDER — LIDOCAINE 2% (20 MG/ML) 5 ML SYRINGE
INTRAMUSCULAR | Status: DC | PRN
  Administered 2022-01-24: 60 mg via INTRAVENOUS

## 2022-01-24 MED ORDER — EPHEDRINE 5 MG/ML INJ
INTRAVENOUS | Status: AC
  Filled 2022-01-24: qty 5

## 2022-01-24 MED ORDER — MIDAZOLAM HCL 5 MG/5ML IJ SOLN
INTRAMUSCULAR | Status: DC | PRN
  Administered 2022-01-24: 2 mg via INTRAVENOUS

## 2022-01-24 MED ORDER — ALBUMIN HUMAN 5 % IV SOLN: INTRAVENOUS | Status: DC | PRN

## 2022-01-24 MED ORDER — ONDANSETRON HCL 4 MG/2ML IJ SOLN
INTRAMUSCULAR | Status: DC | PRN
  Administered 2022-01-24: 4 mg via INTRAVENOUS

## 2022-01-24 MED ORDER — SODIUM CHLORIDE 0.9% FLUSH: 3.0000 mL | INTRAVENOUS | Status: DC | PRN

## 2022-01-24 MED ORDER — ACETAMINOPHEN 325 MG PO TABS
650.0000 mg | ORAL_TABLET | Freq: Four times a day (QID) | ORAL | Status: DC
  Administered 2022-01-24 – 2022-01-25 (×3): 650 mg via ORAL
  Filled 2022-01-24 (×3): qty 2

## 2022-01-24 MED ORDER — CHLORHEXIDINE GLUCONATE 0.12 % MT SOLN
15.0000 mL | Freq: Once | OROMUCOSAL | Status: AC
  Administered 2022-01-24: 15 mL via OROMUCOSAL

## 2022-01-24 MED ORDER — LACTATED RINGERS IV SOLN: INTRAVENOUS | Status: DC

## 2022-01-24 MED ORDER — ONDANSETRON HCL 4 MG/2ML IJ SOLN
INTRAMUSCULAR | Status: AC
  Filled 2022-01-24: qty 2

## 2022-01-24 MED ORDER — PROPOFOL 10 MG/ML IV BOLUS
INTRAVENOUS | Status: DC | PRN
  Administered 2022-01-24: 20 mg via INTRAVENOUS
  Administered 2022-01-24: 140 mg via INTRAVENOUS

## 2022-01-24 MED ORDER — 0.9 % SODIUM CHLORIDE (POUR BTL) OPTIME
TOPICAL | Status: DC | PRN
  Administered 2022-01-24: 1000 mL

## 2022-01-24 MED ORDER — DEXAMETHASONE SODIUM PHOSPHATE 10 MG/ML IJ SOLN
INTRAMUSCULAR | Status: AC
  Filled 2022-01-24: qty 1

## 2022-01-24 MED ORDER — ROCURONIUM BROMIDE 10 MG/ML (PF) SYRINGE
PREFILLED_SYRINGE | INTRAVENOUS | Status: DC | PRN
  Administered 2022-01-24: 50 mg via INTRAVENOUS
  Administered 2022-01-24: 10 mg via INTRAVENOUS

## 2022-01-24 MED ORDER — SODIUM CHLORIDE 0.9 % IV SOLN
2.0000 g | Freq: Once | INTRAVENOUS | Status: AC
  Administered 2022-01-24: 2 g via INTRAVENOUS
  Filled 2022-01-24: qty 20

## 2022-01-24 MED ORDER — HYDROCODONE-ACETAMINOPHEN 5-325 MG PO TABS
1.0000 | ORAL_TABLET | ORAL | Status: DC | PRN
  Administered 2022-01-24: 2 via ORAL
  Administered 2022-01-24: 1 via ORAL
  Filled 2022-01-24: qty 1
  Filled 2022-01-24: qty 2

## 2022-01-24 MED ORDER — DIPHENHYDRAMINE HCL 12.5 MG/5ML PO ELIX: 12.5000 mg | ORAL_SOLUTION | Freq: Four times a day (QID) | ORAL | Status: DC | PRN

## 2022-01-24 MED ORDER — AMITRIPTYLINE HCL 10 MG PO TABS
10.0000 mg | ORAL_TABLET | Freq: Every day | ORAL | Status: DC
  Administered 2022-01-24: 10 mg via ORAL
  Filled 2022-01-24: qty 1

## 2022-01-24 MED ORDER — HYDROMORPHONE HCL 1 MG/ML IJ SOLN: 0.2500 mg | INTRAMUSCULAR | Status: DC | PRN

## 2022-01-24 MED ORDER — ONDANSETRON HCL 4 MG/2ML IJ SOLN
4.0000 mg | Freq: Once | INTRAMUSCULAR | Status: AC | PRN
  Administered 2022-01-24: 4 mg via INTRAVENOUS

## 2022-01-24 MED ORDER — ALBUMIN HUMAN 5 % IV SOLN
INTRAVENOUS | Status: AC
  Filled 2022-01-24: qty 250

## 2022-01-24 MED ORDER — DOCUSATE SODIUM 100 MG PO CAPS
100.0000 mg | ORAL_CAPSULE | Freq: Two times a day (BID) | ORAL | Status: DC
  Administered 2022-01-24: 100 mg via ORAL
  Filled 2022-01-24: qty 1

## 2022-01-24 MED ORDER — PANTOPRAZOLE SODIUM 20 MG PO TBEC
20.0000 mg | DELAYED_RELEASE_TABLET | Freq: Every day | ORAL | Status: DC
  Administered 2022-01-24 – 2022-01-25 (×2): 20 mg via ORAL
  Filled 2022-01-24 (×2): qty 1

## 2022-01-24 MED ORDER — FENTANYL CITRATE (PF) 250 MCG/5ML IJ SOLN
INTRAMUSCULAR | Status: DC | PRN
  Administered 2022-01-24 (×3): 50 ug via INTRAVENOUS

## 2022-01-24 MED ORDER — SODIUM CHLORIDE 0.9 % IV SOLN
1.0000 g | INTRAVENOUS | Status: DC
  Filled 2022-01-24: qty 10

## 2022-01-24 MED ORDER — SUGAMMADEX SODIUM 200 MG/2ML IV SOLN
INTRAVENOUS | Status: DC | PRN
  Administered 2022-01-24: 200 mg via INTRAVENOUS

## 2022-01-24 MED ORDER — GABAPENTIN 300 MG PO CAPS: 300.0000 mg | ORAL_CAPSULE | ORAL | Status: DC

## 2022-01-24 MED ORDER — OXYCODONE HCL 5 MG PO TABS
5.0000 mg | ORAL_TABLET | Freq: Once | ORAL | Status: AC | PRN
  Administered 2022-01-24: 5 mg via ORAL

## 2022-01-24 MED ORDER — FENTANYL CITRATE (PF) 250 MCG/5ML IJ SOLN
INTRAMUSCULAR | Status: AC
  Filled 2022-01-24: qty 5

## 2022-01-24 MED ORDER — HYDROXYZINE HCL 10 MG PO TABS
10.0000 mg | ORAL_TABLET | Freq: Three times a day (TID) | ORAL | Status: DC | PRN
  Administered 2022-01-24: 10 mg via ORAL
  Filled 2022-01-24: qty 1

## 2022-01-24 MED ORDER — SODIUM CHLORIDE 0.9 % IR SOLN
3000.0000 mL | Status: DC
  Administered 2022-01-24: 3000 mL

## 2022-01-24 MED ORDER — MIDAZOLAM HCL 2 MG/2ML IJ SOLN
INTRAMUSCULAR | Status: AC
  Filled 2022-01-24: qty 2

## 2022-01-24 MED ORDER — EPHEDRINE SULFATE-NACL 50-0.9 MG/10ML-% IV SOSY
PREFILLED_SYRINGE | INTRAVENOUS | Status: DC | PRN
  Administered 2022-01-24 (×5): 5 mg via INTRAVENOUS

## 2022-01-24 MED ORDER — CINACALCET HCL 30 MG PO TABS
30.0000 mg | ORAL_TABLET | ORAL | Status: DC
  Administered 2022-01-25: 30 mg via ORAL
  Filled 2022-01-24: qty 1

## 2022-01-24 MED ORDER — DEXAMETHASONE SODIUM PHOSPHATE 10 MG/ML IJ SOLN
INTRAMUSCULAR | Status: DC | PRN
  Administered 2022-01-24: 10 mg via INTRAVENOUS

## 2022-01-24 MED ORDER — SODIUM CHLORIDE 0.9 % IV SOLN: 250.0000 mL | INTRAVENOUS | Status: DC | PRN

## 2022-01-24 MED ORDER — SODIUM CHLORIDE 0.9 % IV SOLN: INTRAVENOUS | Status: DC

## 2022-01-24 MED ORDER — LIDOCAINE HCL (PF) 2 % IJ SOLN
INTRAMUSCULAR | Status: AC
  Filled 2022-01-24: qty 5

## 2022-01-24 MED ORDER — DIPHENHYDRAMINE HCL 50 MG/ML IJ SOLN: 12.5000 mg | Freq: Four times a day (QID) | INTRAMUSCULAR | Status: DC | PRN

## 2022-01-24 SURGICAL SUPPLY — 18 items
BAG DRN RND TRDRP ANRFLXCHMBR (UROLOGICAL SUPPLIES) ×1
BAG URINE DRAIN 2000ML AR STRL (UROLOGICAL SUPPLIES) ×2 IMPLANT
BAG URO CATCHER STRL LF (MISCELLANEOUS) ×2 IMPLANT
CATH HEMA 3WAY 30CC 22FR COUDE (CATHETERS) IMPLANT
DRAPE FOOT SWITCH (DRAPES) ×2 IMPLANT
GLOVE SURG LX STRL 7.5 STRW (GLOVE) ×2 IMPLANT
GOWN STRL REUS W/ TWL XL LVL3 (GOWN DISPOSABLE) ×2 IMPLANT
GOWN STRL REUS W/TWL XL LVL3 (GOWN DISPOSABLE) ×1
HOLDER FOLEY CATH W/STRAP (MISCELLANEOUS) IMPLANT
LOOP CUT BIPOLAR 24F LRG (ELECTROSURGICAL) IMPLANT
MANIFOLD NEPTUNE II (INSTRUMENTS) ×2 IMPLANT
PACK CYSTO (CUSTOM PROCEDURE TRAY) ×2 IMPLANT
PENCIL SMOKE EVACUATOR (MISCELLANEOUS) IMPLANT
SYR 30ML LL (SYRINGE) ×2 IMPLANT
SYR TOOMEY IRRIG 70ML (MISCELLANEOUS) ×1
SYRINGE TOOMEY IRRIG 70ML (MISCELLANEOUS) ×2 IMPLANT
TUBING CONNECTING 10 (TUBING) ×2 IMPLANT
TUBING UROLOGY SET (TUBING) ×2 IMPLANT

## 2022-01-24 NOTE — Op Note (Signed)
Preoperative diagnosis: Bladder outlet obstruction secondary to BPH  Postoperative diagnosis:  Bladder outlet obstruction secondary to BPH  Procedure:  Cystoscopy Bipolar transurethral resection of the prostate  Surgeon: Pryor Curia. M.D.  Anesthesia: General  Complications: None  EBL: Minimal  Specimens: Prostate chips  Disposition of specimens: Pathology  Indication: Chase [name] is a patient with bladder outlet obstruction and urinary retention secondary to benign prostatic hyperplasia. After reviewing the management options for treatment, he elected to proceed with the above surgical procedure(s). We have discussed the potential benefits and risks of the procedure, side effects of the proposed treatment, the likelihood of the patient achieving the goals of the procedure, and any potential problems that might occur during the procedure or recuperation. Informed consent has been obtained.  Description of procedure:  The patient was taken to the operating room and general anesthesia was induced.  The patient was placed in the dorsal lithotomy position, prepped and draped in the usual sterile fashion, and preoperative antibiotics were administered. A preoperative time-out was performed.   Cystourethroscopy was performed.  The patient's urethra was examined and demonstrated a high bladder neck with bilobar prostatic hypertrophy.   The bladder was then systematically examined in its entirety. There was no evidence of any bladder tumors, stones, or other mucosal pathology.  The ureteral orifices were identified and marked so as to be avoided during the procedure.  The prostate adenoma was then resected utilizing loop cautery resection with the bipolar cutting loop.  The prostate adenoma from the bladder neck back to the verumontanum was resected beginning at the six o'clock position and then extended to include the right and left lobes of the prostate and anterior  prostate. Care was taken not to resect distal to the verumontanum.  Hemostasis was then achieved with the cautery and the bladder was emptied and reinspected with no significant bleeding noted at the end of the procedure.    A 3 way catheter was then placed into the bladder and placed on continuous bladder irrigation.  The patient appeared to tolerate the procedure well and without complications.  The patient was able to be awakened and transferred to the recovery unit in satisfactory condition.

## 2022-01-24 NOTE — Anesthesia Procedure Notes (Signed)
Procedure Name: Intubation Date/Time: 01/24/2022 10:10 AM  Performed by: Jenne Campus, CRNAPre-anesthesia Checklist: Patient identified, Emergency Drugs available, Suction available and Patient being monitored Patient Re-evaluated:Patient Re-evaluated prior to induction Oxygen Delivery Method: Circle System Utilized Preoxygenation: Pre-oxygenation with 100% oxygen Induction Type: IV induction Ventilation: Mask ventilation without difficulty Laryngoscope Size: Miller and 3 Grade View: Grade II Tube type: Oral Tube size: 7.5 mm Number of attempts: 1 Airway Equipment and Method: Stylet and Oral airway Placement Confirmation: ETT inserted through vocal cords under direct vision, positive ETCO2 and breath sounds checked- equal and bilateral Secured at: 22 cm Tube secured with: Tape Dental Injury: Teeth and Oropharynx as per pre-operative assessment

## 2022-01-24 NOTE — Transfer of Care (Signed)
Immediate Anesthesia Transfer of Care Note  Patient: Chase [name]  Procedure(s) Performed: TRANSURETHRAL RESECTION OF THE PROSTATE (TURP) (Prostate)  Patient Location: PACU  Anesthesia Type:General  Level of Consciousness: awake, alert  and patient cooperative  Airway & Oxygen Therapy: Patient Spontanous Breathing and Patient connected to face mask oxygen  Post-op Assessment: Report given to RN and Post -op Vital signs reviewed and stable  Post vital signs: Reviewed  Last Vitals:  Vitals Value Taken Time  BP 139/67 01/24/22 1125  Temp    Pulse 65 01/24/22 1127  Resp 10 01/24/22 1127  SpO2 100 % 01/24/22 1127  Vitals shown include unvalidated device data.  Last Pain:  Vitals:   01/24/22 0801  TempSrc:   PainSc: 7       Patients Stated Pain Goal: 6 (55/83/16 7425)  Complications: No notable events documented.

## 2022-01-24 NOTE — Discharge Instructions (Signed)
You may see some blood in the urine and may have some burning with urination for 48-72 hours. You also may notice that you have to urinate more frequently or urgently after your procedure which is normal.  You should call should you develop an inability urinate, fever > 101, persistent nausea and vomiting that prevents you from eating or drinking to stay hydrated.  If you have a catheter, you will be taught how to take care of the catheter by the nursing staff prior to discharge from the hospital.  You may periodically feel a strong urge to void with the catheter in place.  This is a bladder spasm and most often can occur when having a bowel movement or moving around. It is typically self-limited and usually will stop after a few minutes.  You may use some Vaseline or Neosporin around the tip of the catheter to reduce friction at the tip of the penis. You may also see some blood in the urine.  A very small amount of blood can make the urine look quite red.  As long as the catheter is draining well, there usually is not a problem.  However, if the catheter is not draining well and is bloody, you should call the office ([phone]) to notify us.  

## 2022-01-24 NOTE — Interval H&P Note (Signed)
History and Physical Interval Note:  01/24/2022 9:05 AM  Chase [name]  has presented today for surgery, with the diagnosis of URINARY RETENTION.  The various methods of treatment have been discussed with the patient and family. After consideration of risks, benefits and other options for treatment, the patient has consented to  Procedure(s): TRANSURETHRAL RESECTION OF THE PROSTATE (TURP) (N/A) as a surgical intervention.  The patient's history has been reviewed, patient examined, no change in status, stable for surgery.  I have reviewed the patient's chart and labs.  Questions were answered to the patient's satisfaction.     [employer]

## 2022-01-24 NOTE — Anesthesia Postprocedure Evaluation (Signed)
Anesthesia Post Note  Patient: Chase [name]  Procedure(s) Performed: TRANSURETHRAL RESECTION OF THE PROSTATE (TURP) (Prostate)     Patient location during evaluation: PACU Anesthesia Type: General Level of consciousness: awake and alert and oriented Pain management: pain level controlled Vital Signs Assessment: post-procedure vital signs reviewed and stable Respiratory status: spontaneous breathing, nonlabored ventilation, respiratory function stable and patient connected to nasal cannula oxygen Cardiovascular status: blood pressure returned to baseline and stable Postop Assessment: no apparent nausea or vomiting Anesthetic complications: no   No notable events documented.  Last Vitals:  Vitals:   01/24/22 1145 01/24/22 1200  BP: 131/62 125/76  Pulse: 65 62  Resp: (!) 7 17  Temp:    SpO2: 100% 100%    Last Pain:  Vitals:   01/24/22 1125  TempSrc:   PainSc: 3                  Mega Kinkade A.

## 2022-01-25 ENCOUNTER — Encounter (HOSPITAL_COMMUNITY): Payer: Self-pay | Admitting: Urology

## 2022-01-25 DIAGNOSIS — N401 Enlarged prostate with lower urinary tract symptoms: Secondary | ICD-10-CM | POA: Diagnosis not present

## 2022-01-25 LAB — BASIC METABOLIC PANEL
Anion gap: 13 (ref 5–15)
BUN: 34 mg/dL — ABNORMAL HIGH (ref 6–20)
CO2: 21 mmol/L — ABNORMAL LOW (ref 22–32)
Calcium: 8.5 mg/dL — ABNORMAL LOW (ref 8.9–10.3)
Chloride: 99 mmol/L (ref 98–111)
Creatinine, Ser: 6.6 mg/dL — ABNORMAL HIGH (ref 0.61–1.24)
GFR, Estimated: 9 mL/min — ABNORMAL LOW (ref >60)
Glucose, Bld: 120 mg/dL — ABNORMAL HIGH (ref 70–99)
Potassium: 4.1 mmol/L (ref 3.5–5.1)
Sodium: 133 mmol/L — ABNORMAL LOW (ref 135–145)

## 2022-01-25 NOTE — Discharge Summary (Signed)
Date of admission: 01/24/2022  Date of discharge: 01/25/2022  Admission diagnosis: Urinary retention  Discharge diagnosis: Urinary retention  Secondary diagnoses: ESRD  History and Physical: For full details, please see admission history and physical. Briefly, Chase [name] is a 56 y.o. year old patient with ESRD and urinary retention due to BPH.   Hospital Course: He underwent a TURP on 01/24/22.  He tolerated this well.  He was maintained on minimal CBI which was quickly titrated off. He was discharged on POD #1 with his catheter.  His labs were checked and his electrolytes were acceptable and he was felt stable to proceed with his normal outpatient dialysis later this afternoon.  Laboratory values:  Recent Labs    01/24/22 0812  HGB 11.4*  HCT 34.1*   Recent Labs    01/24/22 1205 01/25/22 0428  CREATININE 5.43* 6.60*    Disposition: Home  Discharge instruction: The patient was instructed to be ambulatory but told to refrain from heavy lifting, strenuous activity, or driving.   Discharge medications:  Allergies as of 01/25/2022       Reactions   Nsaids Other (See Comments)   Bleeding GI Ulceration   Ciprofloxacin Nausea And Vomiting        Medication List     STOP taking these medications    aspirin EC 81 MG tablet   atorvastatin 10 MG tablet Commonly known as: LIPITOR   DULoxetine 30 MG capsule Commonly known as: CYMBALTA   oxyCODONE-acetaminophen 5-325 MG tablet Commonly known as: PERCOCET/ROXICET   polycarbophil 625 MG tablet Commonly known as: FIBERCON   silodosin 8 MG Caps capsule Commonly known as: Rapaflo   tamsulosin 0.4 MG Caps capsule Commonly known as: FLOMAX       TAKE these medications    acetaminophen 650 MG CR tablet Commonly known as: TYLENOL Take 1,300 mg by mouth in the morning and at bedtime.   amitriptyline 10 MG tablet Commonly known as: ELAVIL Take 10 mg by mouth at bedtime.   Calcium Acetate 667 MG  Tabs Take 2 tablets by mouth in the morning and at bedtime.   calcium carbonate 500 MG chewable tablet Commonly known as: TUMS - dosed in mg elemental calcium Chew 1 tablet (200 mg of elemental calcium total) by mouth 3 (three) times daily. What changed:  how much to take when to take this reasons to take this   cinacalcet 30 MG tablet Commonly known as: SENSIPAR Take 30 mg by mouth 3 (three) times a week. Take after dialysis   cyanocobalamin 1000 MCG/ML injection Commonly known as: VITAMIN B12 Inject 1,000 mcg into the muscle every 30 (thirty) days.   furosemide 80 MG tablet Commonly known as: LASIX Take 80 mg by mouth daily.   gabapentin 300 MG capsule Commonly known as: NEURONTIN Take 300 mg by mouth 3 (three) times a week. Take after dialysis What changed: Another medication with the same name was removed. Continue taking this medication, and follow the directions you see here.   hydrOXYzine 10 MG tablet Commonly known as: ATARAX Take 1 tablet (10 mg total) by mouth 3 (three) times daily as needed for itching or anxiety. What changed: reasons to take this   midodrine 10 MG tablet Commonly known as: PROAMATINE Take 1 tablet (10 mg total) by mouth daily.   MONOJECT PREFILL HEPARIN SOD IV Inject into the vein. Given at Dialysis Mondays, Wednesdays, and Fridays   multivitamin with minerals Tabs tablet Take 1 tablet by mouth daily.  NARCAN NA Place 4 mg into the nose once. Spray 1 spray into one nostril as directed  ( turn person on side after dose). If no response in 2 to 3 minutes or person responds but relapes a new sprays,repeat  using  a new spray.   pantoprazole 20 MG tablet Commonly known as: PROTONIX TAKE 1 TABLET (20 MG TOTAL) BY MOUTH 2 (TWO) TIMES DAILY. What changed:  how much to take when to take this additional instructions   prazosin 1 MG capsule Commonly known as: MINIPRESS Take 1 mg by mouth at bedtime.        Followup:   Follow-up  Information     Raynelle Bring, MD Follow up.   Specialty: Urology Why: 02/12/22 at 10:30 AM Contact information: [place] [address] [phone]         Raynelle Bring, MD Follow up.   Specialty: Urology Why: Will call to schedule a voiding trial for next week. Contact information: [place] [address] [phone]

## 2022-01-25 NOTE — Progress Notes (Signed)
Discharge information provided to pt, who verbalized understanding of instructions. He has knowledge for how to take care of both colostomy & foley. Foley was switched to leg bag per urologist order. RN utilized safe handling with connections free from contamination during change. Adjusted safely to leg with strap. Pt provided his own personal peri-hygiene. He was wheeled downstairs via wheel chair, will drive his own vehicle to dialysis. Left asymptomatic of any s/s of distress.

## 2022-01-25 NOTE — Progress Notes (Signed)
  Transition of Care [facility]) Screening Note   Patient Details  Name: Chase [name] Date of Birth: 1965-05-19   Transition of Care [facility]) CM/SW Contact:    Roseanne Kaufman, RN Phone Number: 01/25/2022, 10:46 AM    Transition of Care Department [facility]) has reviewed patient and no TOC needs have been identified at this time. We will continue to monitor patient advancement through interdisciplinary progression rounds. If new patient transition needs arise, please place a TOC consult.

## 2022-01-25 NOTE — Plan of Care (Signed)
?  Problem: Education: ?Goal: Knowledge of the prescribed therapeutic regimen will improve ?Outcome: Adequate for Discharge ?  ?Problem: Bowel/Gastric: ?Goal: Gastrointestinal status for postoperative course will improve ?Outcome: Adequate for Discharge ?  ?Problem: Health Behavior/Discharge Planning: ?Goal: Identification of resources available to assist in meeting health care needs will improve ?Outcome: Adequate for Discharge ?  ?Problem: Skin Integrity: ?Goal: Demonstration of wound healing without infection will improve ?Outcome: Adequate for Discharge ?  ?Problem: Urinary Elimination: ?Goal: Ability to avoid or minimize complications of infection will improve ?Outcome: Adequate for Discharge ?  ?Problem: Education: ?Goal: Knowledge of General Education information will improve ?Description: Including pain rating scale, medication(s)/side effects and non-pharmacologic comfort measures ?Outcome: Adequate for Discharge ?  ?Problem: Health Behavior/Discharge Planning: ?Goal: Ability to manage health-related needs will improve ?Outcome: Adequate for Discharge ?  ?Problem: Clinical Measurements: ?Goal: Ability to maintain clinical measurements within normal limits will improve ?Outcome: Adequate for Discharge ?Goal: Will remain free from infection ?Outcome: Adequate for Discharge ?Goal: Diagnostic test results will improve ?Outcome: Adequate for Discharge ?Goal: Respiratory complications will improve ?Outcome: Adequate for Discharge ?Goal: Cardiovascular complication will be avoided ?Outcome: Adequate for Discharge ?  ?Problem: Activity: ?Goal: Risk for activity intolerance will decrease ?Outcome: Adequate for Discharge ?  ?Problem: Nutrition: ?Goal: Adequate nutrition will be maintained ?Outcome: Adequate for Discharge ?  ?Problem: Coping: ?Goal: Level of anxiety will decrease ?Outcome: Adequate for Discharge ?  ?Problem: Elimination: ?Goal: Will not experience complications related to bowel motility ?Outcome:  Adequate for Discharge ?Goal: Will not experience complications related to urinary retention ?Outcome: Adequate for Discharge ?  ?Problem: Pain Managment: ?Goal: General experience of comfort will improve ?Outcome: Adequate for Discharge ?  ?Problem: Safety: ?Goal: Ability to remain free from injury will improve ?Outcome: Adequate for Discharge ?  ?Problem: Skin Integrity: ?Goal: Risk for impaired skin integrity will decrease ?Outcome: Adequate for Discharge ?  ?

## 2022-01-29 LAB — SURGICAL PATHOLOGY

## 2022-12-04 ENCOUNTER — Ambulatory Visit: Payer: Self-pay | Admitting: Surgery

## 2022-12-04 DIAGNOSIS — C61 Malignant neoplasm of prostate: Secondary | ICD-10-CM | POA: Diagnosis present

## 2022-12-04 DIAGNOSIS — Z8719 Personal history of other diseases of the digestive system: Principal | ICD-10-CM

## 2022-12-04 HISTORY — DX: Malignant neoplasm of prostate: C61

## 2022-12-16 ENCOUNTER — Other Ambulatory Visit: Payer: Self-pay | Admitting: Urology

## 2023-01-29 NOTE — Pre-Procedure Instructions (Signed)
Surgical Instructions   Your procedure is scheduled on February 05, 2023. Report to [facility] Main Entrance "A" at 6:30 A.M., then check in with the Admitting office. Any questions or running late day of surgery: call [phone]  Questions prior to your surgery date: call [phone], Monday-Friday, 8am-4pm. If you experience any cold or flu symptoms such as cough, fever, chills, shortness of breath, etc. between now and your scheduled surgery, please notify us at the above number.     Remember:  Do not eat after midnight the night before your surgery   You may drink clear liquids until 5:30 AM the morning of your surgery.   Clear liquids allowed are: Water, Non-Citrus Juices (without pulp), Carbonated Beverages, Clear Tea, Black Coffee Only (NO MILK, CREAM OR POWDERED CREAMER of any kind), and Gatorade.  Patient Instructions  The night before surgery:  No food after midnight. ONLY clear liquids after midnight. Drink TWO (2) Pre-Surgery Clear Ensure night before surgery.  The day of surgery (if you do NOT have diabetes):  Drink ONE (1) Pre-Surgery Clear Ensure by 5:30 AM the morning of surgery. Drink in one sitting. Do not sip.  This drink was given to you during your hospital  pre-op appointment visit.  Nothing else to drink after completing the  Pre-Surgery Clear Ensure.         If you have questions, please contact your surgeon's office.     Take these medicines the morning of surgery with A SIP OF WATER: acetaminophen (TYLENOL)  midodrine (PROAMATINE)  pantoprazole (PROTONIX)    May take these medicines IF NEEDED: hydrOXYzine (ATARAX/VISTARIL)  Naloxone HCl (NARCAN NA) nasal spray   One week prior to surgery, STOP taking any Aspirin (unless otherwise instructed by your surgeon) Aleve, Naproxen, Ibuprofen, Motrin, Advil, Goody's, BC's, all herbal medications, fish oil, and non-prescription vitamins.                     Do NOT Smoke (Tobacco/Vaping) for 24 hours  prior to your procedure.  If you use a CPAP at night, you may bring your mask/headgear for your overnight stay.   You will be asked to remove any contacts, glasses, piercing's, hearing aid's, dentures/partials prior to surgery. Please bring cases for these items if needed.    Patients discharged the day of surgery will not be allowed to drive home, and someone needs to stay with them for 24 hours.  SURGICAL WAITING ROOM VISITATION Patients may have no more than 2 support people in the waiting area - these visitors may rotate.   Pre-op nurse will coordinate an appropriate time for 1 ADULT support person, who may not rotate, to accompany patient in pre-op.  Children under the age of 75 must have an adult with them who is not the patient and must remain in the main waiting area with an adult.  If the patient needs to stay at the hospital during part of their recovery, the visitor guidelines for inpatient rooms apply.  Please refer to the [facility] website for the visitor guidelines for any additional information.   If you received a COVID test during your pre-op visit  it is requested that you wear a mask when out in public, stay away from anyone that may not be feeling well and notify your surgeon if you develop symptoms. If you have been in contact with anyone that has tested positive in the last 10 days please notify you surgeon.      Pre-operative  CHG Bathing Instructions   You can play a key role in reducing the risk of infection after surgery. Your skin needs to be as free of germs as possible. You can reduce the number of germs on your skin by washing with CHG (chlorhexidine gluconate) soap before surgery. CHG is an antiseptic soap that kills germs and continues to kill germs even after washing.   DO NOT use if you have an allergy to chlorhexidine/CHG or antibacterial soaps. If your skin becomes reddened or irritated, stop using the CHG and notify one of our RNs at [phone].               TAKE A SHOWER THE NIGHT BEFORE SURGERY AND THE DAY OF SURGERY    Please keep in mind the following:  DO NOT shave, including legs and underarms, 48 hours prior to surgery.   You may shave your face before/day of surgery.  Place clean sheets on your bed the night before surgery Use a clean washcloth (not used since being washed) for each shower. DO NOT sleep with pet's night before surgery.  CHG Shower Instructions:  Wash your face and private area with normal soap. If you choose to wash your hair, wash first with your normal shampoo.  After you use shampoo/soap, rinse your hair and body thoroughly to remove shampoo/soap residue.  Turn the water OFF and apply half the bottle of CHG soap to a CLEAN washcloth.  Apply CHG soap ONLY FROM YOUR NECK DOWN TO YOUR TOES (washing for 3-5 minutes)  DO NOT use CHG soap on face, private areas, open wounds, or sores.  Pay special attention to the area where your surgery is being performed.  If you are having back surgery, having someone wash your back for you may be helpful. Wait 2 minutes after CHG soap is applied, then you may rinse off the CHG soap.  Pat dry with a clean towel  Put on clean pajamas    Additional instructions for the day of surgery: DO NOT APPLY any lotions, deodorants, cologne, or perfumes.   Do not wear jewelry or makeup Do not wear nail polish, gel polish, artificial nails, or any other type of covering on natural nails (fingers and toes) Do not bring valuables to the hospital. [facility] is not responsible for valuables/personal belongings. Put on clean/comfortable clothes.  Please brush your teeth.  Ask your nurse before applying any prescription medications to the skin.

## 2023-01-30 ENCOUNTER — Encounter (HOSPITAL_COMMUNITY)
Admission: RE | Admit: 2023-01-30 | Discharge: 2023-01-30 | Disposition: A | Discharge status: Home / Self Care | Payer: No Typology Code available for payment source | Source: Ambulatory Visit | Attending: Surgery | Admitting: Surgery

## 2023-01-30 ENCOUNTER — Other Ambulatory Visit: Payer: Self-pay

## 2023-01-30 ENCOUNTER — Encounter (HOSPITAL_COMMUNITY): Payer: Self-pay

## 2023-01-30 VITALS — BP 117/59 | HR 71 | Temp 98.2°F | Resp 18 | Ht 68.0 in | Wt 161.7 lb

## 2023-01-30 DIAGNOSIS — Z8673 Personal history of transient ischemic attack (TIA), and cerebral infarction without residual deficits: Secondary | ICD-10-CM | POA: Insufficient documentation

## 2023-01-30 DIAGNOSIS — I252 Old myocardial infarction: Secondary | ICD-10-CM | POA: Diagnosis not present

## 2023-01-30 DIAGNOSIS — C819A Hodgkin lymphoma, unspecified, in remission: Secondary | ICD-10-CM | POA: Diagnosis not present

## 2023-01-30 DIAGNOSIS — N186 End stage renal disease: Secondary | ICD-10-CM | POA: Insufficient documentation

## 2023-01-30 DIAGNOSIS — N320 Bladder-neck obstruction: Secondary | ICD-10-CM | POA: Diagnosis not present

## 2023-01-30 DIAGNOSIS — E119 Type 2 diabetes mellitus without complications: Secondary | ICD-10-CM | POA: Insufficient documentation

## 2023-01-30 DIAGNOSIS — Z87891 Personal history of nicotine dependence: Secondary | ICD-10-CM | POA: Diagnosis not present

## 2023-01-30 DIAGNOSIS — N289 Disorder of kidney and ureter, unspecified: Secondary | ICD-10-CM | POA: Diagnosis not present

## 2023-01-30 DIAGNOSIS — Z01812 Encounter for preprocedural laboratory examination: Secondary | ICD-10-CM | POA: Diagnosis present

## 2023-01-30 NOTE — Progress Notes (Signed)
[facility] - [facility] (Dr. Lise Auer with [place] per pt) Cardiologist - Dr. Zollie Beckers Ang Tan Nephrologist - Dr. Cloyd Stagers [facility] Urologist - Dr. Frann Rider  PPM/ICD - Denies Device Orders - n/a Rep Notified - n/a  Chest x-ray - n/a EKG - 01/28/2023 - Tracing requested Stress Test - 12/2021 ECHO - 01/28/2023 Cardiac Cath - 06/04/2018  Sleep Study - Denies CPAP - n/a  No DM  Last dose of GLP1 agonist- n/a GLP1 instructions: n/a  Blood Thinner Instructions: n/a Aspirin Instructions: n/a  ERAS Protcol - Clear liquids until 0530 morning of surgery PRE-SURGERY Ensure or G2- Three Pre-Surgery Ensures given to pt with instructions  COVID TEST- n/a   Anesthesia review: Yes. Cardiac Clearance. EKG tracing requested. Hx ESRD on HD, CVA (no deficits), MI and Hodgkin's Lymphoma (in remission since 2011)  Patient denies shortness of breath, fever, cough and chest pain at PAT appointment. Pt denies any respiratory illness/infection in the last two months.    All instructions explained to the patient, with a verbal understanding of the material. Patient agrees to go over the instructions while at home for a better understanding. Patient also instructed to self quarantine after being tested for COVID-19. The opportunity to ask questions was provided.

## 2023-01-31 NOTE — Anesthesia Preprocedure Evaluation (Signed)
Anesthesia Evaluation  Patient identified by MRN, date of birth, ID band Patient awake    Reviewed: Allergy & Precautions, H&P , NPO status , Patient's Chart, lab work & pertinent test results  Airway Mallampati: II   Neck ROM: full    Dental   Pulmonary shortness of breath, former smoker   breath sounds clear to auscultation       Cardiovascular + CAD   Rhythm:regular Rate:Normal  Echo 01/28/23 [facility]): - The left ventricular size, thickness and function are normal. Ejection Fraction = >55% (Visual Estimation). The left ventricular wall motion is normal. Tissue Doppler sampling consistent with Normal diastolic function.. - The right ventricle is normal in size and function. The left atrium is mildly dilated. Right ventricular systolic pressure unable to be evaluated due to insufficient TR. Doppler findings do not suggest pulmonary hypertension. - The IVC is normal in size with an inspiratory collapse of greater then 50%,suggesting normal right atrial pressure. - The aortic root is normal size. - There is no pericardial effusion.  Nuclear stress test 01/10/22 [facility]): There are no defects seen on stress or rest images to suggest ischemia or infarct. No evidence of reversible ischemia. EF calculated at 55% during stress.  Limited Echo for Bubble Study 06/09/18 ([facility]): Injection of agitated saline showed moderate right-to-left shunt. Shunting  occurred spontaneously.      Neuro/Psych  Headaches PSYCHIATRIC DISORDERS Anxiety Depression    CVA    GI/Hepatic ,GERD  ,,  Endo/Other    Renal/GU ESRF and DialysisRenal disease     Musculoskeletal  (+) Arthritis ,    Abdominal   Peds  Hematology   Anesthesia Other Findings   Reproductive/Obstetrics                             Anesthesia Physical Anesthesia Plan  ASA: 3  Anesthesia Plan: General   Post-op Pain Management:    Induction:  Intravenous  PONV Risk Score and Plan: 2 and Ondansetron, Dexamethasone, Midazolam and Treatment may vary due to age or medical condition  Airway Management Planned: Oral ETT  Additional Equipment:   Intra-op Plan:   Post-operative Plan: Extubation in OR  Informed Consent: I have reviewed the patients History and Physical, chart, labs and discussed the procedure including the risks, benefits and alternatives for the proposed anesthesia with the patient or authorized representative who has indicated his/her understanding and acceptance.     Dental advisory given  Plan Discussed with: CRNA, Anesthesiologist and Surgeon  Anesthesia Plan Comments: (PAT note written 01/31/2023 by Shonna Chock, PA-C. Dialysis patient. Has cardiology input.   )       Anesthesia Quick Evaluation

## 2023-01-31 NOTE — Progress Notes (Signed)
Anesthesia Chart Review:  Case: [id number] Date/Time: 02/05/23 0815   Procedures:      XI ROBOTIC ASSISTED OSTOMY TAKEDOWN - 210 TOTAL INCLUDING [facility] TIME     LYSIS OF ADHESION     RIGDI PROCTOSCOPY     CYSTOSCOPY with FIREFLY INJECTION   Anesthesia type: General   Pre-op diagnosis: COLOSTOMY FOR COLON RESECTION. DESIRE FOR OSTOMY TAKEDOWN   [facility] ROOM 10 / [facility] OR   Surgeons: Karie Soda, MD; Despina Arias, MD       DISCUSSION: Patient is a 57 year old male scheduled for the above procedure.   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   He had a preoperative cardiology evaluation at the [facility] on 11/22/22 by fellow Parag Quenton Fetter, MBBS/Sanjay Newt Lukes, MD. Known intermittent chronic dizziness, but no chest pain or SOB. By notes, EKG showed SB. For Preoperative Risk Assessment:  "-RCRI score of 2 which indicates intermediate cardiac risk for undergoing planned robotic colostomy takedown -The patient had myocardial perfusion imaging in October 2023 without any evidence of reversible ischemia -Patient is medically optimized and no other testing is recommended."  No EKG received in  the 135 pages faxed by [facility]. We did receive 01/10/22 non-ischemic stress test. 01/28/23 echo can be viewed in [facility] and showed LVEF > 55%. Copy of echo report and 11/22/22 EKG requested. If not received, can get on the day of surgery as indicated.   He had labs at the [facility] on 01/08/23 showing total bili 0.5, Ca 9.2, Alk Ph 113, ALT 54, AST 47, glucose 138, Na 135, K 3.3, Cr 5.96, Phos 3.6, BUN 39, HGB 11.5, HCT 33.1, PLT 229. He is a dialysis patient, so he will get an ISTAT on the day of surgery.    Anesthesia team to evaluate on the day of surgery.    VS: BP (!) 117/59   Pulse 71   Temp 36.8 C   Resp 18   Ht 5\' 8"  (1.727 m)   Wt 73.3 kg   SpO2 99%   BMI 24.59 kg/m    PROVIDERS: Clinic, [facility], [place] is PCP  Pete Pelt, MD is cardiologist; Preoperative Risk Assessment 11/22/22 by Carleene Cooper, MD [facility]) Jonette Pesa, MD is nephrologist Heloise Purpura, MD is urologist  Karie Soda, MD is general surgeon    LABS: See DISCUSSION regarding 01/08/23 results at [facility]. He will need an ISTAT on arrival given ESRD.   EKG: 11/22/22 (Per [facility] Cardiology Note): Sinus bradycardia without any significant ST-T wave changes. Copy of tracing requested.    CV: Echo 01/28/23 [facility]): Summary Statements - The left ventricular size, thickness and function are normal. Ejection Fraction = >55% (Visual Estimation). The left ventricular wall motion is normal. Tissue Doppler sampling consistent with Normal diastolic function.. - The right ventricle is  normal in size and function. The left atrium is mildly dilated. Right ventricular systolic pressure unable to be evaluated due to insufficient TR. Doppler findings do not suggest pulmonary hypertension. - The IVC is normal in size with an inspiratory collapse of greater then 50%,suggesting normal right atrial pressure. - The aortic root is normal size. - There is no pericardial effusion. - Comparison echo  08/14/21 EF > 55%, No RWMA, trace MR/TR, RVSP 25-30 mmHg; 5/2/521 [facility] possibly moderate-severe LV dysfunction, stage I DD, mild-moderately reduced RVSF, small pericardial effusion; 06/05/18 [facility]: LVEF 40%, mild-moderate LV global hypokinesis, borderline dilated RV, normal RVSF, trace MR, trace TR, trace PR   Nuclear stress test 01/10/22 [facility]): IMPRESSION: There are no defects seen on stress or rest images to suggest ischemia or infarct. No evidence of reversible ischemia. Ejection fraction is calculated at 55% during stress.   Limited Echo for Bubble Study 06/09/18 ([facility]): SUMMARY  Injection of agitated saline contrast performed to evaluate for possible  shunt.  Injection of agitated saline showed moderate right-to-left shunt. Shunting  occurred spontaneously.    Cardiac cath 06/09/18 ([facility]): Coronary Findings Diagnostic Dominance: Co-dominant  Right Coronary Artery: The vessel is small. The vessel exhibits minimal luminal irregularities. Small caliber vessel that does extend to posterior apex.   Past Medical History:  Diagnosis Date   Anemia    Arthritis    Cervical radiculopathy    with left sided weakness   Chemical exposure    service related injury   Chronic pain    CKD (chronic kidney disease), stage IV (HCC) 10/17/2019   COVID 2020   COVID-19    Dyspnea    GERD (gastroesophageal reflux disease)    GIB (gastrointestinal bleeding)    Headache    Heart attack (HCC)    History of CVA (cerebrovascular accident) 12/31/2019   History of gastric bypass 08/24/2019   History of stomach ulcers 05/19/2013   Formatting of this note might be different from the original. Attributed to chronic ibuprofen use   Hodgkin lymphoma, unspecified, unspecified site (HCC) 12/10/2012   Formatting of this note might be different from the original. In remission.   Hypokalemia    Hypomagnesemia    Interatrial cardiac shunt    06/09/18 agitated bubble study ([facility]):  moderate right-to-left interatrial shunt   Pneumonia    walking pneumonia   PTSD (post-traumatic stress disorder)    Renal disorder    Dialysis T/Th/Sa   Stroke [facility]) 2019   Urinary retention    due to bladder outlet obstruction    Past Surgical History:  Procedure Laterality Date   AV FISTULA PLACEMENT Left 10/16/2020   Procedure: LEFT ARM ARTERIOVENOUS BRACHIOBASILIC FISTULA CREATION;  Surgeon: Cephus Shelling, MD;  Location: [facility] OR;  Service: Vascular;  Laterality: Left;   BASCILIC VEIN TRANSPOSITION Left 02/05/2021   Procedure: LEFT SECOND STAGE BASILIC VEIN TRANSPOSITION;  Surgeon: Cephus Shelling, MD;  Location: [facility] OR;  Service: Vascular;  Laterality: Left;   CHOLECYSTECTOMY     COLONOSCOPY     COLONOSCOPY WITH PROPOFOL N/A 04/18/2020   Procedure: COLONOSCOPY WITH PROPOFOL;  Surgeon: Sherrilyn Rist, MD;  Location: [facility] ENDOSCOPY;  Service: Gastroenterology;  Laterality: N/A;   CYSTOSCOPY W/ URETERAL STENT PLACEMENT N/A 11/14/2019   Procedure: CYSTOSCOPY WITH RETROGRADE PYELOGRAM bilateral Otilio Miu STENT PLACEMENT left fulguration bladder . cystogram;  Surgeon: Heloise Purpura, MD;  Location: [facility] ORS;  Service: Urology;  Laterality: N/A;   CYSTOSCOPY W/ URETERAL STENT PLACEMENT  Bilateral 12/25/2019   Procedure: CYSTOSCOPY WITH bilateral  RETROGRADE PYELOGRAM/ leftURETERAL STENT PLACEMENT;  Surgeon: Crista Elliot, MD;  Location: [facility];  Service: Urology;  Laterality: Bilateral;   CYSTOSCOPY W/ URETERAL STENT PLACEMENT Bilateral 07/06/2020   Procedure: CYSTOSCOPY WITH BILATERAL STENT EXCHANGE; BILATERAL PYELOGRAM RETROGRADE;  Surgeon: Heloise Purpura, MD;  Location: [facility];  Service: Urology;  Laterality: Bilateral;   CYSTOSCOPY WITH URETEROSCOPY AND STENT PLACEMENT N/A 04/18/2020   Procedure: CYSTOSCOPY WITH BILATERAL URETERAL STENT CHANGE PLACEMENT;  Surgeon: Crist Fat, MD;  Location[facility] OR;  Service: Urology;  Laterality: N/A;   EPIGASTRIC HERNIA REPAIR N/A  04/18/2020   Procedure: HERNIA REPAIR EPIGASTRIC ADULT;  Surgeon: Karie Soda, MD;  Location[facility];  Service: General;  Laterality: N/A;   GASTRIC BYPASS     HERNIA REPAIR  1970   INGUINAL HERNIA REPAIR Left    INSERTION OF DIALYSIS CATHETER Right 10/16/2020   Procedure: INSERTION OF RIGHT INTERNAL JUGULAR TUNNELED DIALYSIS CATHETER WITH ULTRASOUND GUIDANCE;  Surgeon: Cephus Shelling, MD;  [facility];  Service: Vascular;  Laterality: Right;   IR FLUORO GUIDE CV LINE RIGHT  04/01/2020   IR NEPHRO TUBE REMOV/FL  04/25/2020   IR NEPHRO TUBE REMOV/FL  10/23/2020   IR NEPHRO TUBE REMOV/FL  10/23/2020   IR NEPHROSTOMY PLACEMENT LEFT  12/27/2019   IR NEPHROSTOMY PLACEMENT LEFT  04/01/2020   IR NEPHROSTOMY PLACEMENT LEFT  10/08/2020   IR NEPHROSTOMY PLACEMENT RIGHT  11/16/2019   IR NEPHROSTOMY PLACEMENT RIGHT  12/27/2019   IR NEPHROSTOMY PLACEMENT RIGHT  04/01/2020   IR NEPHROSTOMY PLACEMENT RIGHT  10/08/2020   IR SINUS/FIST TUBE CHK-NON GI  12/03/2019   IR URETERAL STENT PLACEMENT EXISTING ACCESS RIGHT  12/08/2019   IR URETERAL STENT PLACEMENT EXISTING ACCESS RIGHT  01/26/2020   IR US GUIDANCE  10/08/2020   IR US GUIDANCE  10/08/2020   IR US GUIDE VASC ACCESS RIGHT  04/01/2020   LAPAROSCOPIC LOOP COLOSTOMY N/A 04/18/2020   Procedure: LAPAROSCOPIC SIGMOID COLOTOMY WITH COLOSTOMY;  Surgeon: Karie Soda, MD;  Location[facility];  Service: General;  Laterality: N/A;   TRANSURETHRAL RESECTION OF PROSTATE N/A 01/24/2022   Procedure: TRANSURETHRAL RESECTION OF THE PROSTATE (TURP);  Surgeon: Heloise Purpura, MD;  Location[facility];  Service: Urology;  Laterality: N/A;    MEDICATIONS:  acetaminophen (TYLENOL) 500 MG tablet   amitriptyline (ELAVIL) 10 MG tablet   Calcium Acetate 667 MG TABS   calcium carbonate (TUMS - DOSED IN MG ELEMENTAL CALCIUM) 500 MG chewable tablet   cinacalcet (SENSIPAR) 30 MG tablet   cyanocobalamin (,VITAMIN B-12,) 1000 MCG/ML injection   furosemide (LASIX) 80  MG tablet   gabapentin (NEURONTIN) 100 MG capsule   gabapentin (NEURONTIN) 300 MG capsule   Heparin Sod, Pork, Lock Flush (MONOJECT PREFILL HEPARIN SOD IV)   hydrOXYzine (ATARAX/VISTARIL) 10 MG tablet   midodrine (PROAMATINE) 10 MG tablet   Multiple Vitamin (MULTIVITAMIN WITH MINERALS) TABS tablet   Naloxone HCl (NARCAN NA)   pantoprazole (PROTONIX) 20 MG tablet   prazosin (MINIPRESS) 1 MG capsule   No current facility-administered medications for this encounter.    Shonna Chock, PA-C Surgical Short Stay/Anesthesiology [facility] Phone [phone] [facility] Phone [phone] 01/31/2023 5:28 PM

## 2023-02-05 ENCOUNTER — Inpatient Hospital Stay (HOSPITAL_COMMUNITY): Payer: No Typology Code available for payment source | Admitting: Vascular Surgery

## 2023-02-05 ENCOUNTER — Inpatient Hospital Stay (HOSPITAL_COMMUNITY)
Admission: RE | Admit: 2023-02-05 | Discharge: 2023-02-09 | DRG: 329 | Disposition: A | Discharge status: Home / Self Care | Payer: No Typology Code available for payment source | Attending: Surgery | Admitting: Surgery

## 2023-02-05 ENCOUNTER — Encounter (HOSPITAL_COMMUNITY)
Admission: RE | Disposition: A | Discharge status: Home / Self Care | Payer: Self-pay | Source: Home / Self Care | Attending: Surgery

## 2023-02-05 ENCOUNTER — Other Ambulatory Visit: Payer: Self-pay

## 2023-02-05 ENCOUNTER — Inpatient Hospital Stay (HOSPITAL_COMMUNITY): Payer: No Typology Code available for payment source | Admitting: Anesthesiology

## 2023-02-05 ENCOUNTER — Encounter (HOSPITAL_COMMUNITY): Payer: Self-pay | Admitting: Surgery

## 2023-02-05 DIAGNOSIS — E876 Hypokalemia: Secondary | ICD-10-CM | POA: Insufficient documentation

## 2023-02-05 DIAGNOSIS — G894 Chronic pain syndrome: Secondary | ICD-10-CM | POA: Diagnosis present

## 2023-02-05 DIAGNOSIS — D631 Anemia in chronic kidney disease: Secondary | ICD-10-CM | POA: Diagnosis present

## 2023-02-05 DIAGNOSIS — F419 Anxiety disorder, unspecified: Secondary | ICD-10-CM | POA: Diagnosis present

## 2023-02-05 DIAGNOSIS — D510 Vitamin B12 deficiency anemia due to intrinsic factor deficiency: Secondary | ICD-10-CM | POA: Diagnosis present

## 2023-02-05 DIAGNOSIS — N289 Disorder of kidney and ureter, unspecified: Secondary | ICD-10-CM

## 2023-02-05 DIAGNOSIS — C61 Malignant neoplasm of prostate: Secondary | ICD-10-CM | POA: Diagnosis present

## 2023-02-05 DIAGNOSIS — Z886 Allergy status to analgesic agent status: Secondary | ICD-10-CM

## 2023-02-05 DIAGNOSIS — Z992 Dependence on renal dialysis: Secondary | ICD-10-CM | POA: Diagnosis not present

## 2023-02-05 DIAGNOSIS — Z87448 Personal history of other diseases of urinary system: Secondary | ICD-10-CM

## 2023-02-05 DIAGNOSIS — E538 Deficiency of other specified B group vitamins: Secondary | ICD-10-CM | POA: Insufficient documentation

## 2023-02-05 DIAGNOSIS — Z8249 Family history of ischemic heart disease and other diseases of the circulatory system: Secondary | ICD-10-CM | POA: Diagnosis not present

## 2023-02-05 DIAGNOSIS — Z8572 Personal history of non-Hodgkin lymphomas: Secondary | ICD-10-CM

## 2023-02-05 DIAGNOSIS — D638 Anemia in other chronic diseases classified elsewhere: Secondary | ICD-10-CM | POA: Diagnosis present

## 2023-02-05 DIAGNOSIS — Z9884 Bariatric surgery status: Secondary | ICD-10-CM | POA: Diagnosis not present

## 2023-02-05 DIAGNOSIS — F4310 Post-traumatic stress disorder, unspecified: Secondary | ICD-10-CM | POA: Diagnosis present

## 2023-02-05 DIAGNOSIS — Z7682 Awaiting organ transplant status: Secondary | ICD-10-CM

## 2023-02-05 DIAGNOSIS — I2510 Atherosclerotic heart disease of native coronary artery without angina pectoris: Secondary | ICD-10-CM | POA: Diagnosis not present

## 2023-02-05 DIAGNOSIS — N135 Crossing vessel and stricture of ureter without hydronephrosis: Secondary | ICD-10-CM | POA: Diagnosis present

## 2023-02-05 DIAGNOSIS — Z8673 Personal history of transient ischemic attack (TIA), and cerebral infarction without residual deficits: Secondary | ICD-10-CM

## 2023-02-05 DIAGNOSIS — Z87891 Personal history of nicotine dependence: Secondary | ICD-10-CM | POA: Diagnosis not present

## 2023-02-05 DIAGNOSIS — Z433 Encounter for attention to colostomy: Secondary | ICD-10-CM

## 2023-02-05 DIAGNOSIS — H9190 Unspecified hearing loss, unspecified ear: Secondary | ICD-10-CM

## 2023-02-05 DIAGNOSIS — I69354 Hemiplegia and hemiparesis following cerebral infarction affecting left non-dominant side: Secondary | ICD-10-CM

## 2023-02-05 DIAGNOSIS — N189 Chronic kidney disease, unspecified: Secondary | ICD-10-CM | POA: Diagnosis present

## 2023-02-05 DIAGNOSIS — N1330 Unspecified hydronephrosis: Secondary | ICD-10-CM | POA: Diagnosis present

## 2023-02-05 DIAGNOSIS — Z8719 Personal history of other diseases of the digestive system: Principal | ICD-10-CM

## 2023-02-05 DIAGNOSIS — I63512 Cerebral infarction due to unspecified occlusion or stenosis of left middle cerebral artery: Secondary | ICD-10-CM

## 2023-02-05 DIAGNOSIS — N186 End stage renal disease: Secondary | ICD-10-CM

## 2023-02-05 DIAGNOSIS — Z8616 Personal history of COVID-19: Secondary | ICD-10-CM

## 2023-02-05 DIAGNOSIS — Z91199 Patient's noncompliance with other medical treatment and regimen due to unspecified reason: Secondary | ICD-10-CM | POA: Diagnosis not present

## 2023-02-05 DIAGNOSIS — Z87440 Personal history of urinary (tract) infections: Secondary | ICD-10-CM

## 2023-02-05 DIAGNOSIS — R1310 Dysphagia, unspecified: Secondary | ICD-10-CM | POA: Diagnosis present

## 2023-02-05 DIAGNOSIS — I259 Chronic ischemic heart disease, unspecified: Secondary | ICD-10-CM | POA: Diagnosis present

## 2023-02-05 DIAGNOSIS — F32A Depression, unspecified: Secondary | ICD-10-CM | POA: Diagnosis present

## 2023-02-05 DIAGNOSIS — R32 Unspecified urinary incontinence: Secondary | ICD-10-CM | POA: Diagnosis present

## 2023-02-05 DIAGNOSIS — I214 Non-ST elevation (NSTEMI) myocardial infarction: Secondary | ICD-10-CM | POA: Insufficient documentation

## 2023-02-05 DIAGNOSIS — Z881 Allergy status to other antibiotic agents status: Secondary | ICD-10-CM

## 2023-02-05 DIAGNOSIS — M2550 Pain in unspecified joint: Secondary | ICD-10-CM | POA: Diagnosis present

## 2023-02-05 DIAGNOSIS — H2513 Age-related nuclear cataract, bilateral: Secondary | ICD-10-CM

## 2023-02-05 DIAGNOSIS — F609 Personality disorder, unspecified: Secondary | ICD-10-CM | POA: Diagnosis present

## 2023-02-05 DIAGNOSIS — K5732 Diverticulitis of large intestine without perforation or abscess without bleeding: Secondary | ICD-10-CM | POA: Diagnosis present

## 2023-02-05 DIAGNOSIS — I120 Hypertensive chronic kidney disease with stage 5 chronic kidney disease or end stage renal disease: Secondary | ICD-10-CM | POA: Diagnosis present

## 2023-02-05 DIAGNOSIS — Z800 Family history of malignant neoplasm of digestive organs: Secondary | ICD-10-CM | POA: Diagnosis not present

## 2023-02-05 DIAGNOSIS — Z8571 Personal history of Hodgkin lymphoma: Secondary | ICD-10-CM

## 2023-02-05 DIAGNOSIS — Z79899 Other long term (current) drug therapy: Secondary | ICD-10-CM

## 2023-02-05 DIAGNOSIS — Z8051 Family history of malignant neoplasm of kidney: Secondary | ICD-10-CM

## 2023-02-05 DIAGNOSIS — Z79891 Long term (current) use of opiate analgesic: Secondary | ICD-10-CM

## 2023-02-05 DIAGNOSIS — T819XXA Unspecified complication of procedure, initial encounter: Secondary | ICD-10-CM | POA: Insufficient documentation

## 2023-02-05 DIAGNOSIS — I252 Old myocardial infarction: Secondary | ICD-10-CM

## 2023-02-05 DIAGNOSIS — I222 Subsequent non-ST elevation (NSTEMI) myocardial infarction: Secondary | ICD-10-CM | POA: Insufficient documentation

## 2023-02-05 DIAGNOSIS — Z8711 Personal history of peptic ulcer disease: Secondary | ICD-10-CM

## 2023-02-05 DIAGNOSIS — K219 Gastro-esophageal reflux disease without esophagitis: Secondary | ICD-10-CM | POA: Diagnosis present

## 2023-02-05 HISTORY — PX: XI ROBOTIC ASSISTED COLOSTOMY TAKEDOWN: SHX6828

## 2023-02-05 HISTORY — DX: Metabolic encephalopathy: G93.41

## 2023-02-05 HISTORY — PX: LYSIS OF ADHESION: SHX5961

## 2023-02-05 HISTORY — DX: Acute respiratory failure with hypoxia: J96.01

## 2023-02-05 HISTORY — DX: Non-ST elevation (NSTEMI) myocardial infarction: I21.4

## 2023-02-05 HISTORY — DX: Cerebral infarction due to unspecified occlusion or stenosis of left middle cerebral artery: I63.512

## 2023-02-05 HISTORY — DX: Age-related nuclear cataract, bilateral: H25.13

## 2023-02-05 HISTORY — PX: PROCTOSCOPY: SHX2266

## 2023-02-05 LAB — POCT I-STAT, CHEM 8
BUN: 26 mg/dL — ABNORMAL HIGH (ref 6–20)
Calcium, Ion: 1.06 mmol/L — ABNORMAL LOW (ref 1.15–1.40)
Chloride: 99 mmol/L (ref 98–111)
Creatinine, Ser: 6.1 mg/dL — ABNORMAL HIGH (ref 0.61–1.24)
Glucose, Bld: 96 mg/dL (ref 70–99)
HCT: 37 % — ABNORMAL LOW (ref 39.0–52.0)
Hemoglobin: 12.6 g/dL — ABNORMAL LOW (ref 13.0–17.0)
Potassium: 3 mmol/L — ABNORMAL LOW (ref 3.5–5.1)
Sodium: 138 mmol/L (ref 135–145)
TCO2: 25 mmol/L (ref 22–32)

## 2023-02-05 LAB — HEPATITIS B SURFACE ANTIGEN: Hepatitis B Surface Ag: NONREACTIVE

## 2023-02-05 SURGERY — CLOSURE, COLOSTOMY, ROBOT-ASSISTED
Anesthesia: General

## 2023-02-05 MED ORDER — CINACALCET HCL 30 MG PO TABS
30.0000 mg | ORAL_TABLET | ORAL | Status: DC
  Administered 2023-02-07: 30 mg via ORAL
  Filled 2023-02-05: qty 1

## 2023-02-05 MED ORDER — ALVIMOPAN 12 MG PO CAPS
12.0000 mg | ORAL_CAPSULE | Freq: Every day | ORAL | Status: AC
  Filled 2023-02-05: qty 1

## 2023-02-05 MED ORDER — BUPIVACAINE-EPINEPHRINE (PF) 0.5% -1:200000 IJ SOLN
INTRAMUSCULAR | Status: AC
  Filled 2023-02-05: qty 30

## 2023-02-05 MED ORDER — CALCIUM POLYCARBOPHIL 625 MG PO TABS
625.0000 mg | ORAL_TABLET | Freq: Two times a day (BID) | ORAL | Status: DC
  Administered 2023-02-05 – 2023-02-09 (×8): 625 mg via ORAL
  Filled 2023-02-05 (×8): qty 1

## 2023-02-05 MED ORDER — POLYETHYLENE GLYCOL 3350 17 GM/SCOOP PO POWD: 1.0000 | Freq: Once | ORAL | Status: DC

## 2023-02-05 MED ORDER — CHLORHEXIDINE GLUCONATE CLOTH 2 % EX PADS
6.0000 | MEDICATED_PAD | Freq: Once | CUTANEOUS | Status: DC
Start: 2023-02-05 — End: 2023-02-05

## 2023-02-05 MED ORDER — THROMBIN (RECOMBINANT) 20000 UNITS EX SOLR
CUTANEOUS | Status: AC
  Filled 2023-02-05: qty 20000

## 2023-02-05 MED ORDER — TRAMADOL HCL 50 MG PO TABS
50.0000 mg | ORAL_TABLET | Freq: Four times a day (QID) | ORAL | Status: DC | PRN
Start: 2023-02-05 — End: 2023-02-09
  Administered 2023-02-05 – 2023-02-09 (×7): 100 mg via ORAL
  Filled 2023-02-05 (×7): qty 2

## 2023-02-05 MED ORDER — ALVIMOPAN 12 MG PO CAPS
12.0000 mg | ORAL_CAPSULE | ORAL | Status: AC
  Administered 2023-02-05: 12 mg via ORAL
  Filled 2023-02-05: qty 1

## 2023-02-05 MED ORDER — GLYCOPYRROLATE PF 0.2 MG/ML IJ SOSY
PREFILLED_SYRINGE | INTRAMUSCULAR | Status: AC
  Filled 2023-02-05: qty 1

## 2023-02-05 MED ORDER — SODIUM CHLORIDE 0.9% FLUSH
3.0000 mL | Freq: Two times a day (BID) | INTRAVENOUS | Status: DC
  Administered 2023-02-05 – 2023-02-09 (×6): 3 mL via INTRAVENOUS

## 2023-02-05 MED ORDER — FENTANYL CITRATE (PF) 250 MCG/5ML IJ SOLN
INTRAMUSCULAR | Status: AC
  Filled 2023-02-05: qty 5

## 2023-02-05 MED ORDER — HYDROMORPHONE HCL 1 MG/ML IJ SOLN
INTRAMUSCULAR | Status: AC
  Filled 2023-02-05: qty 0.5

## 2023-02-05 MED ORDER — SODIUM CHLORIDE 0.9 % IV SOLN
2.0000 g | INTRAVENOUS | Status: AC
  Administered 2023-02-05: 2 g via INTRAVENOUS
  Filled 2023-02-05: qty 2

## 2023-02-05 MED ORDER — ADULT MULTIVITAMIN W/MINERALS CH
1.0000 | ORAL_TABLET | Freq: Every day | ORAL | Status: DC
Start: 2023-02-05 — End: 2023-02-09
  Administered 2023-02-05 – 2023-02-09 (×4): 1 via ORAL
  Filled 2023-02-05 (×4): qty 1

## 2023-02-05 MED ORDER — STERILE WATER FOR IRRIGATION IR SOLN
Status: DC | PRN
  Administered 2023-02-05: 1000 mL

## 2023-02-05 MED ORDER — ENSURE SURGERY PO LIQD
237.0000 mL | Freq: Two times a day (BID) | ORAL | Status: DC
  Administered 2023-02-07: 237 mL via ORAL
  Filled 2023-02-05 (×9): qty 237

## 2023-02-05 MED ORDER — NEOMYCIN SULFATE 500 MG PO TABS: 1000.0000 mg | ORAL_TABLET | ORAL | Status: DC

## 2023-02-05 MED ORDER — HEPARIN SODIUM (PORCINE) 5000 UNIT/ML IJ SOLN
5000.0000 [IU] | Freq: Once | INTRAMUSCULAR | Status: AC
Start: 2023-02-05 — End: 2023-02-05
  Administered 2023-02-05: 5000 [IU] via SUBCUTANEOUS
  Filled 2023-02-05: qty 1

## 2023-02-05 MED ORDER — FENTANYL CITRATE (PF) 250 MCG/5ML IJ SOLN
INTRAMUSCULAR | Status: DC | PRN
  Administered 2023-02-05 (×2): 50 ug via INTRAVENOUS
  Administered 2023-02-05: 100 ug via INTRAVENOUS
  Administered 2023-02-05: 50 ug via INTRAVENOUS

## 2023-02-05 MED ORDER — CALCIUM CARBONATE ANTACID 500 MG PO CHEW: 500.0000 mg | CHEWABLE_TABLET | Freq: Three times a day (TID) | ORAL | Status: DC | PRN

## 2023-02-05 MED ORDER — EPHEDRINE SULFATE-NACL 50-0.9 MG/10ML-% IV SOSY
PREFILLED_SYRINGE | INTRAVENOUS | Status: DC | PRN
  Administered 2023-02-05 (×2): 5 mg via INTRAVENOUS

## 2023-02-05 MED ORDER — OXYCODONE HCL 5 MG PO TABS: 5.0000 mg | ORAL_TABLET | Freq: Once | ORAL | Status: DC | PRN

## 2023-02-05 MED ORDER — METHOCARBAMOL 500 MG PO TABS
1000.0000 mg | ORAL_TABLET | Freq: Four times a day (QID) | ORAL | Status: DC | PRN
  Administered 2023-02-07 – 2023-02-09 (×4): 1000 mg via ORAL
  Filled 2023-02-05 (×4): qty 2

## 2023-02-05 MED ORDER — METHOCARBAMOL 1000 MG/10ML IJ SOLN: 1000.0000 mg | Freq: Four times a day (QID) | INTRAMUSCULAR | Status: DC | PRN

## 2023-02-05 MED ORDER — PROPOFOL 10 MG/ML IV BOLUS
INTRAVENOUS | Status: AC
  Filled 2023-02-05: qty 20

## 2023-02-05 MED ORDER — MAGIC MOUTHWASH: 15.0000 mL | Freq: Four times a day (QID) | ORAL | Status: DC | PRN

## 2023-02-05 MED ORDER — SODIUM CHLORIDE 0.9 % IV SOLN
2.0000 g | Freq: Two times a day (BID) | INTRAVENOUS | Status: AC
  Administered 2023-02-05: 2 g via INTRAVENOUS
  Filled 2023-02-05: qty 2

## 2023-02-05 MED ORDER — SUCCINYLCHOLINE CHLORIDE 200 MG/10ML IV SOSY
PREFILLED_SYRINGE | INTRAVENOUS | Status: DC | PRN
  Administered 2023-02-05: 120 mg via INTRAVENOUS

## 2023-02-05 MED ORDER — AMISULPRIDE (ANTIEMETIC) 5 MG/2ML IV SOLN
10.0000 mg | Freq: Once | INTRAVENOUS | Status: AC
  Administered 2023-02-05: 10 mg via INTRAVENOUS

## 2023-02-05 MED ORDER — SALINE SPRAY 0.65 % NA SOLN: 1.0000 | Freq: Four times a day (QID) | NASAL | Status: DC | PRN

## 2023-02-05 MED ORDER — BUPIVACAINE LIPOSOME 1.3 % IJ SUSP
INTRAMUSCULAR | Status: DC | PRN
  Administered 2023-02-05: 70 mL via SURGICAL_CAVITY

## 2023-02-05 MED ORDER — AMITRIPTYLINE HCL 10 MG PO TABS: 10.0000 mg | ORAL_TABLET | Freq: Every evening | ORAL | Status: DC | PRN

## 2023-02-05 MED ORDER — SODIUM CHLORIDE 0.9% FLUSH: 3.0000 mL | INTRAVENOUS | Status: DC | PRN

## 2023-02-05 MED ORDER — METRONIDAZOLE 500 MG PO TABS: 1000.0000 mg | ORAL_TABLET | ORAL | Status: DC

## 2023-02-05 MED ORDER — MENTHOL 3 MG MT LOZG: 1.0000 | LOZENGE | OROMUCOSAL | Status: DC | PRN

## 2023-02-05 MED ORDER — ONDANSETRON HCL 4 MG/2ML IJ SOLN: 4.0000 mg | Freq: Four times a day (QID) | INTRAMUSCULAR | Status: DC | PRN

## 2023-02-05 MED ORDER — INDOCYANINE GREEN 25 MG IV SOLR
INTRAVENOUS | Status: DC | PRN
  Administered 2023-02-05: 7 mg

## 2023-02-05 MED ORDER — MIDAZOLAM HCL 2 MG/2ML IJ SOLN
INTRAMUSCULAR | Status: AC
  Filled 2023-02-05: qty 2

## 2023-02-05 MED ORDER — PHENOL 1.4 % MT LIQD: 2.0000 | OROMUCOSAL | Status: DC | PRN

## 2023-02-05 MED ORDER — HYDROMORPHONE HCL 1 MG/ML IJ SOLN
INTRAMUSCULAR | Status: DC | PRN
  Administered 2023-02-05: .5 mg via INTRAVENOUS

## 2023-02-05 MED ORDER — HYDRALAZINE HCL 20 MG/ML IJ SOLN: 10.0000 mg | INTRAMUSCULAR | Status: DC | PRN

## 2023-02-05 MED ORDER — DEXTROSE-SODIUM CHLORIDE 5-0.9 % IV SOLN: INTRAVENOUS | Status: DC

## 2023-02-05 MED ORDER — PHENYLEPHRINE HCL-NACL 20-0.9 MG/250ML-% IV SOLN
INTRAVENOUS | Status: AC
  Filled 2023-02-05: qty 250

## 2023-02-05 MED ORDER — 0.9 % SODIUM CHLORIDE (POUR BTL) OPTIME
TOPICAL | Status: DC | PRN
  Administered 2023-02-05: 1000 mL

## 2023-02-05 MED ORDER — BUPIVACAINE LIPOSOME 1.3 % IJ SUSP
INTRAMUSCULAR | Status: AC
  Filled 2023-02-05: qty 20

## 2023-02-05 MED ORDER — CALCIUM ACETATE (PHOS BINDER) 667 MG PO CAPS
667.0000 mg | ORAL_CAPSULE | Freq: Three times a day (TID) | ORAL | Status: DC
  Filled 2023-02-05 (×2): qty 1

## 2023-02-05 MED ORDER — BISACODYL 5 MG PO TBEC
20.0000 mg | DELAYED_RELEASE_TABLET | Freq: Once | ORAL | Status: DC
Start: 2023-02-05 — End: 2023-02-05

## 2023-02-05 MED ORDER — HYDROXYZINE HCL 10 MG PO TABS
10.0000 mg | ORAL_TABLET | Freq: Four times a day (QID) | ORAL | Status: DC | PRN
  Administered 2023-02-06 – 2023-02-08 (×7): 10 mg via ORAL
  Filled 2023-02-05 (×8): qty 1

## 2023-02-05 MED ORDER — SODIUM CHLORIDE 0.9 % IV SOLN: 250.0000 mL | INTRAVENOUS | Status: DC | PRN

## 2023-02-05 MED ORDER — ONDANSETRON HCL 4 MG/2ML IJ SOLN
INTRAMUSCULAR | Status: DC | PRN
  Administered 2023-02-05: 4 mg via INTRAVENOUS

## 2023-02-05 MED ORDER — BUPIVACAINE-EPINEPHRINE (PF) 0.25% -1:200000 IJ SOLN
INTRAMUSCULAR | Status: AC
  Filled 2023-02-05: qty 60

## 2023-02-05 MED ORDER — LIDOCAINE HCL URETHRAL/MUCOSAL 2 % EX GEL
CUTANEOUS | Status: AC
  Filled 2023-02-05: qty 11

## 2023-02-05 MED ORDER — ACETAMINOPHEN 500 MG PO TABS
1000.0000 mg | ORAL_TABLET | ORAL | Status: AC
Start: 2023-02-05 — End: 2023-02-05
  Administered 2023-02-05: 1000 mg via ORAL
  Filled 2023-02-05: qty 2

## 2023-02-05 MED ORDER — NAPHAZOLINE-GLYCERIN 0.012-0.25 % OP SOLN: 1.0000 [drp] | Freq: Four times a day (QID) | OPHTHALMIC | Status: DC | PRN

## 2023-02-05 MED ORDER — CHLORHEXIDINE GLUCONATE 0.12 % MT SOLN
15.0000 mL | Freq: Once | OROMUCOSAL | Status: AC
  Administered 2023-02-05: 15 mL via OROMUCOSAL
  Filled 2023-02-05: qty 15

## 2023-02-05 MED ORDER — SUGAMMADEX SODIUM 200 MG/2ML IV SOLN
INTRAVENOUS | Status: DC | PRN
  Administered 2023-02-05: 200 mg via INTRAVENOUS

## 2023-02-05 MED ORDER — GABAPENTIN 100 MG PO CAPS
100.0000 mg | ORAL_CAPSULE | ORAL | Status: DC
  Administered 2023-02-06 – 2023-02-08 (×2): 100 mg via ORAL
  Filled 2023-02-05 (×2): qty 1

## 2023-02-05 MED ORDER — DEXAMETHASONE SODIUM PHOSPHATE 10 MG/ML IJ SOLN
INTRAMUSCULAR | Status: DC | PRN
  Administered 2023-02-05: 10 mg via INTRAVENOUS

## 2023-02-05 MED ORDER — ACETAMINOPHEN 500 MG PO TABS
1000.0000 mg | ORAL_TABLET | Freq: Four times a day (QID) | ORAL | Status: DC
  Administered 2023-02-05 – 2023-02-09 (×15): 1000 mg via ORAL
  Filled 2023-02-05 (×15): qty 2

## 2023-02-05 MED ORDER — CALCIUM ACETATE (PHOS BINDER) 667 MG PO CAPS
667.0000 mg | ORAL_CAPSULE | Freq: Three times a day (TID) | ORAL | Status: DC
  Administered 2023-02-05 – 2023-02-09 (×9): 667 mg via ORAL
  Filled 2023-02-05 (×9): qty 1

## 2023-02-05 MED ORDER — HYDROMORPHONE HCL 1 MG/ML IJ SOLN
0.5000 mg | INTRAMUSCULAR | Status: DC | PRN
  Administered 2023-02-05: 1 mg via INTRAVENOUS
  Administered 2023-02-06: 2 mg via INTRAVENOUS
  Administered 2023-02-06 (×2): 1 mg via INTRAVENOUS
  Administered 2023-02-07: 2 mg via INTRAVENOUS
  Administered 2023-02-07: 1 mg via INTRAVENOUS
  Administered 2023-02-08: 2 mg via INTRAVENOUS
  Administered 2023-02-08: 1 mg via INTRAVENOUS
  Administered 2023-02-08: 2 mg via INTRAVENOUS
  Administered 2023-02-09: 1 mg via INTRAVENOUS
  Administered 2023-02-09: 2 mg via INTRAVENOUS
  Filled 2023-02-05: qty 2
  Filled 2023-02-05: qty 1
  Filled 2023-02-05 (×3): qty 2
  Filled 2023-02-05 (×3): qty 1
  Filled 2023-02-05: qty 2
  Filled 2023-02-05: qty 1
  Filled 2023-02-05: qty 2
  Filled 2023-02-05 (×3): qty 1

## 2023-02-05 MED ORDER — PHENYLEPHRINE 80 MCG/ML (10ML) SYRINGE FOR IV PUSH (FOR BLOOD PRESSURE SUPPORT)
PREFILLED_SYRINGE | INTRAVENOUS | Status: DC | PRN
  Administered 2023-02-05 (×2): 160 ug via INTRAVENOUS
  Administered 2023-02-05 (×2): 80 ug via INTRAVENOUS

## 2023-02-05 MED ORDER — ALUM & MAG HYDROXIDE-SIMETH 200-200-20 MG/5ML PO SUSP: 30.0000 mL | Freq: Four times a day (QID) | ORAL | Status: DC | PRN

## 2023-02-05 MED ORDER — METOCLOPRAMIDE HCL 5 MG/ML IJ SOLN: 5.0000 mg | Freq: Three times a day (TID) | INTRAMUSCULAR | Status: DC | PRN

## 2023-02-05 MED ORDER — BUPIVACAINE LIPOSOME 1.3 % IJ SUSP
20.0000 mL | Freq: Once | INTRAMUSCULAR | Status: DC
Start: 2023-02-05 — End: 2023-02-05

## 2023-02-05 MED ORDER — ENSURE PRE-SURGERY PO LIQD
592.0000 mL | Freq: Once | ORAL | Status: DC
Start: 2023-02-05 — End: 2023-02-05

## 2023-02-05 MED ORDER — GABAPENTIN 300 MG PO CAPS
300.0000 mg | ORAL_CAPSULE | ORAL | Status: AC
Start: 2023-02-05 — End: 2023-02-05
  Administered 2023-02-05: 300 mg via ORAL
  Filled 2023-02-05: qty 1

## 2023-02-05 MED ORDER — FENTANYL CITRATE (PF) 100 MCG/2ML IJ SOLN: 25.0000 ug | INTRAMUSCULAR | Status: DC | PRN

## 2023-02-05 MED ORDER — PROPOFOL 10 MG/ML IV BOLUS
INTRAVENOUS | Status: DC | PRN
  Administered 2023-02-05: 120 mg via INTRAVENOUS

## 2023-02-05 MED ORDER — PROPOFOL 1000 MG/100ML IV EMUL
INTRAVENOUS | Status: AC
  Filled 2023-02-05: qty 100

## 2023-02-05 MED ORDER — METOPROLOL TARTRATE 5 MG/5ML IV SOLN: 5.0000 mg | Freq: Four times a day (QID) | INTRAVENOUS | Status: DC | PRN

## 2023-02-05 MED ORDER — ONDANSETRON HCL 4 MG/2ML IJ SOLN
INTRAMUSCULAR | Status: AC
  Filled 2023-02-05: qty 2

## 2023-02-05 MED ORDER — SODIUM CHLORIDE 0.9 % IV SOLN: INTRAVENOUS | Status: DC | PRN

## 2023-02-05 MED ORDER — PANTOPRAZOLE SODIUM 20 MG PO TBEC
20.0000 mg | DELAYED_RELEASE_TABLET | Freq: Two times a day (BID) | ORAL | Status: DC
  Administered 2023-02-05 – 2023-02-09 (×8): 20 mg via ORAL
  Filled 2023-02-05 (×8): qty 1

## 2023-02-05 MED ORDER — TRAMADOL HCL 50 MG PO TABS: 50.0000 mg | ORAL_TABLET | Freq: Four times a day (QID) | ORAL | 0 refills | Status: AC | PRN

## 2023-02-05 MED ORDER — ENSURE PRE-SURGERY PO LIQD: 296.0000 mL | Freq: Once | ORAL | Status: DC

## 2023-02-05 MED ORDER — PHENYLEPHRINE HCL-NACL 20-0.9 MG/250ML-% IV SOLN
INTRAVENOUS | Status: DC | PRN
  Administered 2023-02-05: 30 ug/min via INTRAVENOUS

## 2023-02-05 MED ORDER — LACTATED RINGERS IV SOLN: 1000.0000 mL | Freq: Three times a day (TID) | INTRAVENOUS | Status: AC | PRN

## 2023-02-05 MED ORDER — HEPARIN SODIUM (PORCINE) 5000 UNIT/ML IJ SOLN
5000.0000 [IU] | Freq: Two times a day (BID) | INTRAMUSCULAR | Status: DC
  Administered 2023-02-05 – 2023-02-09 (×5): 5000 [IU] via SUBCUTANEOUS
  Filled 2023-02-05 (×7): qty 1

## 2023-02-05 MED ORDER — ONDANSETRON HCL 4 MG PO TABS
4.0000 mg | ORAL_TABLET | Freq: Four times a day (QID) | ORAL | Status: DC | PRN
Start: 2023-02-05 — End: 2023-02-09

## 2023-02-05 MED ORDER — GABAPENTIN 300 MG PO CAPS
300.0000 mg | ORAL_CAPSULE | ORAL | Status: DC
  Administered 2023-02-05 – 2023-02-07 (×2): 300 mg via ORAL
  Filled 2023-02-05 (×2): qty 1

## 2023-02-05 MED ORDER — AMISULPRIDE (ANTIEMETIC) 5 MG/2ML IV SOLN
INTRAVENOUS | Status: AC
  Filled 2023-02-05: qty 4

## 2023-02-05 MED ORDER — OXYCODONE HCL 5 MG/5ML PO SOLN: 5.0000 mg | Freq: Once | ORAL | Status: DC | PRN

## 2023-02-05 MED ORDER — ORAL CARE MOUTH RINSE: 15.0000 mL | Freq: Once | OROMUCOSAL | Status: AC

## 2023-02-05 MED ORDER — MIDAZOLAM HCL 2 MG/2ML IJ SOLN
INTRAMUSCULAR | Status: DC | PRN
  Administered 2023-02-05 (×2): 1 mg via INTRAVENOUS

## 2023-02-05 MED ORDER — SODIUM CHLORIDE 0.9 % IV SOLN: INTRAVENOUS | Status: DC

## 2023-02-05 MED ORDER — ROCURONIUM BROMIDE 10 MG/ML (PF) SYRINGE
PREFILLED_SYRINGE | INTRAVENOUS | Status: DC | PRN
  Administered 2023-02-05: 10 mg via INTRAVENOUS
  Administered 2023-02-05: 20 mg via INTRAVENOUS
  Administered 2023-02-05: 60 mg via INTRAVENOUS

## 2023-02-05 MED ORDER — SIMETHICONE 80 MG PO CHEW: 40.0000 mg | CHEWABLE_TABLET | Freq: Four times a day (QID) | ORAL | Status: DC | PRN

## 2023-02-05 MED ORDER — PRAZOSIN HCL 1 MG PO CAPS
1.0000 mg | ORAL_CAPSULE | Freq: Every day | ORAL | Status: DC
  Administered 2023-02-05 – 2023-02-08 (×4): 1 mg via ORAL
  Filled 2023-02-05 (×5): qty 1

## 2023-02-05 SURGICAL SUPPLY — 122 items
ADH SKN CLS APL DERMABOND .7 (GAUZE/BANDAGES/DRESSINGS) ×1
APL PRP STRL LF DISP 70% ISPRP (MISCELLANEOUS) ×1
BAG COUNTER SPONGE SURGICOUNT (BAG) ×2 IMPLANT
BAG DRN RND TRDRP ANRFLXCHMBR (UROLOGICAL SUPPLIES) ×1
BAG SPNG CNTER NS LX DISP (BAG) ×1
BAG URINE DRAIN 2000ML AR STRL (UROLOGICAL SUPPLIES) ×2 IMPLANT
BAG URO CATCHER STRL LF (MISCELLANEOUS) ×2 IMPLANT
CANISTER SUCT 3000ML PPV (MISCELLANEOUS) ×2 IMPLANT
CANNULA REDUCER 12-8 DVNC XI (CANNULA) IMPLANT
CATH FOLEY 2WAY SLVR 5CC 16FR (CATHETERS) IMPLANT
CATH URETL OPEN END 6FR 70 (CATHETERS) IMPLANT
CHLORAPREP W/TINT 26 (MISCELLANEOUS) ×2 IMPLANT
COVER MAYO STAND STRL (DRAPES) ×2 IMPLANT
COVER SURGICAL LIGHT HANDLE (MISCELLANEOUS) ×4 IMPLANT
COVER TIP SHEARS 8 DVNC (MISCELLANEOUS) IMPLANT
DEFOGGER SCOPE WARMER CLEARIFY (MISCELLANEOUS) ×2 IMPLANT
DERMABOND ADVANCED .7 DNX12 (GAUZE/BANDAGES/DRESSINGS) ×2 IMPLANT
DEVICE TROCAR PUNCTURE CLOSURE (ENDOMECHANICALS) IMPLANT
DRAPE ARM DVNC X/XI (DISPOSABLE) ×8 IMPLANT
DRAPE COLUMN DVNC XI (DISPOSABLE) ×2 IMPLANT
DRAPE CV SPLIT W-CLR ANES SCRN (DRAPES) ×2 IMPLANT
DRAPE SURG ORHT 6 SPLT 77X108 (DRAPES) ×2 IMPLANT
DRAPE UTILITY XL STRL (DRAPES) ×2 IMPLANT
DRIVER NDL MEGA SUTCUT DVNCXI (INSTRUMENTS) ×2 IMPLANT
DRIVER NDLE MEGA SUTCUT DVNCXI (INSTRUMENTS)
DRSG OPSITE POSTOP 4X6 (GAUZE/BANDAGES/DRESSINGS) IMPLANT
DRSG OPSITE POSTOP 4X8 (GAUZE/BANDAGES/DRESSINGS) IMPLANT
ELECT CAUTERY BLADE 6.4 (BLADE) ×4 IMPLANT
ELECT REM PT RETURN 9FT ADLT (ELECTROSURGICAL) ×1
ELECTRODE REM PT RTRN 9FT ADLT (ELECTROSURGICAL) ×2 IMPLANT
ENDOLOOP SUT PDS II 0 18 (SUTURE) IMPLANT
EVACUATOR SILICONE 100CC (DRAIN) IMPLANT
FORCEPS BPLR FENES DVNC XI (FORCEP) ×2 IMPLANT
GAUZE 4X4 16PLY ~~LOC~~+RFID DBL (SPONGE) ×2 IMPLANT
GAUZE SPONGE 2X2 8PLY STRL LF (GAUZE/BANDAGES/DRESSINGS) IMPLANT
GAUZE SPONGE 4X4 12PLY STRL (GAUZE/BANDAGES/DRESSINGS) ×2 IMPLANT
GLOVE BIO SURGEON STRL SZ7.5 (GLOVE) ×2 IMPLANT
GLOVE BIO SURGEON STRL SZ8 (GLOVE) ×2 IMPLANT
GLOVE BIOGEL PI IND STRL 8 (GLOVE) ×2 IMPLANT
GLOVE SURG SS PI 8.0 STRL IVOR (GLOVE) ×2 IMPLANT
GOWN STRL REUS W/ TWL LRG LVL3 (GOWN DISPOSABLE) ×6 IMPLANT
GOWN STRL REUS W/ TWL XL LVL3 (GOWN DISPOSABLE) ×8 IMPLANT
GOWN STRL REUS W/TWL 2XL LVL3 (GOWN DISPOSABLE) ×2 IMPLANT
GOWN STRL REUS W/TWL LRG LVL3 (GOWN DISPOSABLE) ×3
GOWN STRL REUS W/TWL XL LVL3 (GOWN DISPOSABLE) ×4
GRASPER TIP-UP FEN DVNC XI (INSTRUMENTS) IMPLANT
GUIDEWIRE ANG ZIPWIRE 038X150 (WIRE) IMPLANT
GUIDEWIRE STR DUAL SENSOR (WIRE) IMPLANT
IRRIG SUCT STRYKERFLOW 2 WTIP (MISCELLANEOUS) ×1
IRRIGATION SUCT STRKRFLW 2 WTP (MISCELLANEOUS) ×2 IMPLANT
KIT BASIN OR (CUSTOM PROCEDURE TRAY) ×2 IMPLANT
KIT SIGMOIDOSCOPE (SET/KITS/TRAYS/PACK) ×2 IMPLANT
KIT TURNOVER KIT B (KITS) ×4 IMPLANT
MANIFOLD NEPTUNE II (INSTRUMENTS) IMPLANT
NDL 22X1.5 STRL (OR ONLY) (MISCELLANEOUS) ×2 IMPLANT
NDL HYPO 22X1.5 SAFETY MO (MISCELLANEOUS) ×2 IMPLANT
NDL INSUFFLATION 14GA 120MM (NEEDLE) ×2 IMPLANT
NEEDLE 22X1.5 STRL (OR ONLY) (MISCELLANEOUS) ×1
NEEDLE HYPO 22X1.5 SAFETY MO (MISCELLANEOUS) ×1
NEEDLE INSUFFLATION 14GA 120MM (NEEDLE) ×1
NS IRRIG 1000ML POUR BTL (IV SOLUTION) ×4 IMPLANT
OBTURATOR OPTICAL STND 8 DVNC (TROCAR)
OBTURATOR OPTICALSTD 8 DVNC (TROCAR) IMPLANT
PACK COLON (CUSTOM PROCEDURE TRAY) IMPLANT
PACK CYSTO (CUSTOM PROCEDURE TRAY) ×2 IMPLANT
PACK LITHOTOMY IV (CUSTOM PROCEDURE TRAY) ×2 IMPLANT
PAD ARMBOARD 7.5X6 YLW CONV (MISCELLANEOUS) ×6 IMPLANT
PENCIL SMOKE EVACUATOR (MISCELLANEOUS) ×2 IMPLANT
RELOAD STAPLE 60 3.5 BLU DVNC (STAPLE) IMPLANT
RELOAD STAPLE 60 4.3 GRN DVNC (STAPLE) IMPLANT
RETRACTOR GRSP SML 8 DVNC XI (INSTRUMENTS) ×2 IMPLANT
SCISSORS LAP 5X35 DISP (ENDOMECHANICALS) IMPLANT
SCISSORS MNPLR CVD DVNC XI (INSTRUMENTS) ×2 IMPLANT
SEAL UNIV 5-12 XI (MISCELLANEOUS) ×10 IMPLANT
SEALER VESSEL EXT DVNC XI (MISCELLANEOUS) ×2 IMPLANT
SET TUBE SMOKE EVAC HIGH FLOW (TUBING) ×2 IMPLANT
SHEARS HARMONIC 9CM CVD (BLADE) ×2 IMPLANT
SPECIMEN JAR SMALL (MISCELLANEOUS) ×2 IMPLANT
SPIKE FLUID TRANSFER (MISCELLANEOUS) ×2 IMPLANT
SPONGE SURGIFOAM ABS GEL 100 (HEMOSTASIS) IMPLANT
SPONGE T-LAP 18X18 ~~LOC~~+RFID (SPONGE) IMPLANT
STAPLER 60 SUREFORM DVNC (STAPLE) IMPLANT
STAPLER RELOAD 3.5X60 BLU DVNC (STAPLE)
STAPLER RELOAD 4.3X60 GRN DVNC (STAPLE)
STAPLER VISISTAT 35W (STAPLE) ×2 IMPLANT
STENT URET 6FRX24 CONTOUR (STENTS) IMPLANT
STENT URET 6FRX26 CONTOUR (STENTS) IMPLANT
STOPCOCK 4 WAY LG BORE MALE ST (IV SETS) ×2 IMPLANT
SURGILUBE 2OZ TUBE FLIPTOP (MISCELLANEOUS) ×2 IMPLANT
SUT CHROMIC 2 0 SH (SUTURE) ×2 IMPLANT
SUT CHROMIC 3 0 SH 27 (SUTURE) ×2 IMPLANT
SUT DVC VLOC 180 2-0 12IN GS21 (SUTURE)
SUT ETHILON 2 0 FS 18 (SUTURE) IMPLANT
SUT MNCRL AB 4-0 PS2 18 (SUTURE) ×2 IMPLANT
SUT NOVA NAB DX-16 0-1 5-0 T12 (SUTURE) IMPLANT
SUT PDS AB 0 CT1 27 (SUTURE) IMPLANT
SUT PDS AB 1 CT1 36 (SUTURE) IMPLANT
SUT PDS AB 1 CTX 36 (SUTURE) IMPLANT
SUT PDS AB 2-0 CT2 27 (SUTURE) IMPLANT
SUT PROLENE 0 SH 30 (SUTURE) IMPLANT
SUT SILK 2 0 (SUTURE) ×1
SUT SILK 2 0 SH CR/8 (SUTURE) IMPLANT
SUT SILK 2-0 18XBRD TIE 12 (SUTURE) IMPLANT
SUT SILK 3 0 (SUTURE) ×1
SUT SILK 3 0 SH CR/8 (SUTURE) IMPLANT
SUT SILK 3-0 18XBRD TIE 12 (SUTURE) IMPLANT
SUT VICRYL 0 TIES 12 18 (SUTURE) IMPLANT
SUTURE DVC VL 180 2-0 12INGS21 (SUTURE) IMPLANT
SYPHON OMNI JUG (MISCELLANEOUS) ×2 IMPLANT
SYR 20ML LL LF (SYRINGE) ×2 IMPLANT
SYR 30ML SLIP (SYRINGE) ×2 IMPLANT
SYR CONTROL 10ML LL (SYRINGE) ×2 IMPLANT
SYS WOUND ALEXIS 18CM MED (MISCELLANEOUS)
SYSTEM WOUND ALEXIS 18CM MED (MISCELLANEOUS) ×2 IMPLANT
TOWEL GREEN STERILE FF (TOWEL DISPOSABLE) ×6 IMPLANT
TRAY FOLEY MTR SLVR 16FR STAT (SET/KITS/TRAYS/PACK) ×2 IMPLANT
TROCAR ADV FIXATION 5X100MM (TROCAR) ×2 IMPLANT
TUBE CONNECTING 12X1/4 (SUCTIONS) ×2 IMPLANT
TUBE CONNECTING 20X1/4 (TUBING) ×4 IMPLANT
UNDERPAD 30X36 HEAVY ABSORB (UNDERPADS AND DIAPERS) ×2 IMPLANT
WATER STERILE IRR 3000ML UROMA (IV SOLUTION) ×2 IMPLANT
YANKAUER SUCT BULB TIP NO VENT (SUCTIONS) ×2 IMPLANT

## 2023-02-05 NOTE — Consult Note (Signed)
Reason for Consult: Continuity of ESRD care Referring Physician: Karie Soda MD (surgery)  HPI:  58 year old man with past medical history significant for non-Hodgkin's lymphoma, history of CVA, non-ST elevation MI, posttraumatic stress disorder and end-stage renal disease on hemodialysis.  He was evaluated for elective robotic colostomy takedown as well as flexible sigmoidoscopy.  When seen in the PACU, he reports to be fairly comfortable and he is able to engage in conversation regarding his dialysis.  He denies any chest pain or shortness of breath last went to dialysis (Monday 11/1).  Per his recall: He dialyzes 3 hours and 45 minutes via his left upper arm AV fistula and his EDW is actively being revised; 74 kg to 72.5 kg.  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 coli UTI (urinary tract infection) 11/15/2019   ESRD (end stage renal disease) on dialysis [facility]) 11/23/2020   Nov 01, 2020 Entered By:  Dionicio Stall L Comment: left AV shunt 09/2020     GERD (gastroesophageal reflux disease)    GIB (gastrointestinal bleeding)    Headache    Heart attack (HCC)    History of COVID-19 08/24/2019   History of CVA (cerebrovascular accident) 12/31/2019   History of gastric bypass 08/24/2019   History of stomach ulcers 05/19/2013   Formatting of this note might be different from the original. Attributed to chronic ibuprofen use   Hodgkin lymphoma, unspecified, unspecified site (HCC) 12/10/2012   Formatting of this note might be different from the original. In remission.   Hypokalemia    Hypomagnesemia    Incisional hernia - epigastric 01/04/2020   Interatrial cardiac shunt    06/09/18 agitated bubble study ([facility]): moderate right-to-left interatrial shunt   Pelvic abscess with colovescial fistula 11/15/2019   Personality disorder  04/19/2020   Pneumonia    walking pneumonia   Prostate cancer (HCC) 12/04/2022   Pseudomonas urinary tract infection 08/24/2019   PTSD (post-traumatic stress disorder)    Renal disorder    Dialysis T/Th/Sa   Stroke [facility]) 2019   Toxic metabolic encephalopathy 08/24/2019   Urinary retention    due to bladder outlet obstruction   Urinary retention due to benign prostatic hyperplasia 01/24/2022    Past Surgical History:  Procedure Laterality Date   AV FISTULA PLACEMENT Left 10/16/2020   Procedure: LEFT ARM ARTERIOVENOUS BRACHIOBASILIC FISTULA CREATION;  Surgeon: Cephus Shelling, MD;  Location: [facility] OR;  Service: Vascular;  Laterality: Left;   BASCILIC VEIN TRANSPOSITION Left  02/05/2021   Procedure: LEFT SECOND STAGE BASILIC VEIN TRANSPOSITION;  Surgeon: Cephus Shelling, MD;  Location: [facility] OR;  Service: Vascular;  Laterality: Left;   CHOLECYSTECTOMY     COLECTOMY WITH COLOSTOMY CREATION/HARTMANN PROCEDURE  04/18/2020   COLONOSCOPY     COLONOSCOPY WITH PROPOFOL N/A 04/18/2020   Procedure: COLONOSCOPY WITH PROPOFOL;  Surgeon: Sherrilyn Rist, MD;   Location: [facility] ENDOSCOPY;  Service: Gastroenterology;  Laterality: N/A;   COLOVESICAL REPAIR  04/22/2020   CYSTOSCOPY W/ URETERAL STENT PLACEMENT N/A 11/14/2019   Procedure: CYSTOSCOPY WITH RETROGRADE PYELOGRAM bilateral Otilio Miu STENT PLACEMENT left fulguration bladder . cystogram;  Surgeon: Heloise Purpura, MD;  Location: [facility] ORS;  Service: Urology;  Laterality: N/A;   CYSTOSCOPY W/ URETERAL STENT PLACEMENT Bilateral 12/25/2019   Procedure: CYSTOSCOPY WITH bilateral  RETROGRADE PYELOGRAM/ leftURETERAL STENT PLACEMENT;  Surgeon: Crista Elliot, MD;  Location: [facility] ORS;  Service: Urology;  Laterality: Bilateral;   CYSTOSCOPY W/ URETERAL STENT PLACEMENT Bilateral 07/06/2020   Procedure: CYSTOSCOPY WITH BILATERAL STENT EXCHANGE; BILATERAL PYELOGRAM RETROGRADE;  Surgeon: Heloise Purpura, MD;  Location: [facility] ORS;  Service: Urology;  Laterality: Bilateral;   CYSTOSCOPY WITH URETEROSCOPY AND STENT PLACEMENT N/A 04/18/2020   Procedure: CYSTOSCOPY WITH BILATERAL URETERAL STENT CHANGE PLACEMENT;  Surgeon: Crist Fat, MD;  Location: [facility] OR;  Service: Urology;  Laterality: N/A;   EPIGASTRIC HERNIA REPAIR N/A 04/18/2020   Procedure: HERNIA REPAIR EPIGASTRIC ADULT;  Surgeon: Karie Soda, MD;  Location: [facility] OR;  Service: General;  Laterality: N/A;   GASTRIC BYPASS  1994   HERNIA REPAIR  1970   INSERTION OF DIALYSIS CATHETER Right 10/16/2020   Procedure: INSERTION OF RIGHT INTERNAL JUGULAR TUNNELED DIALYSIS CATHETER WITH ULTRASOUND GUIDANCE;  Surgeon: Cephus Shelling, MD;  Location: [facility] OR;  Service: Vascular;  Laterality: Right;   IR FLUORO GUIDE CV LINE RIGHT  04/01/2020   IR NEPHRO TUBE REMOV/FL  04/25/2020   IR NEPHRO TUBE REMOV/FL  10/23/2020   IR NEPHRO TUBE REMOV/FL  10/23/2020   IR NEPHROSTOMY PLACEMENT LEFT  12/27/2019   IR NEPHROSTOMY PLACEMENT LEFT  04/01/2020   IR NEPHROSTOMY PLACEMENT LEFT  10/08/2020   IR NEPHROSTOMY PLACEMENT RIGHT  11/16/2019   IR NEPHROSTOMY PLACEMENT RIGHT   12/27/2019   IR NEPHROSTOMY PLACEMENT RIGHT  04/01/2020   IR NEPHROSTOMY PLACEMENT RIGHT  10/08/2020   IR SINUS/FIST TUBE CHK-NON GI  12/03/2019   IR URETERAL STENT PLACEMENT EXISTING ACCESS RIGHT  12/08/2019   IR URETERAL STENT PLACEMENT EXISTING ACCESS RIGHT  01/26/2020   IR US GUIDANCE  10/08/2020   IR US GUIDANCE  10/08/2020   IR US GUIDE VASC ACCESS RIGHT  04/01/2020   LAPAROSCOPIC INGUINAL [facility]   LAPAROSCOPIC LOOP COLOSTOMY N/A 04/18/2020   [facility]   TRANSURETHRAL RESECTION OF PROSTATE N/A 01/24/2022   Procedure: TRANSURETHRAL RESECTION OF THE PROSTATE (TURP);  Surgeon: Heloise Purpura, MD;  Location: [facility] ORS;  Service: Urology;  Laterality: N/A;    Family History  Problem Relation Age of Onset   Renal cancer Father        mets to liver and lungs and pancreas   Heart disease Father    Heart disease Paternal Grandfather    Colon cancer Maternal Grandmother    Colon cancer Paternal Uncle    Stomach cancer Neg Hx    Esophageal cancer Neg Hx    Pancreatic cancer Neg Hx     Social History:  reports that he quit smoking  about 30 years ago. His smoking use included cigarettes. His smokeless tobacco use includes chew. He reports current alcohol use. He reports that he does not use drugs.  Allergies:  Allergies  Allergen Reactions   Nsaids Other (See Comments)    Bleeding GI Ulceration   Aspirin Nausea And Vomiting   Ciprofloxacin Itching   Ketorolac Tromethamine Other (See Comments)    Serum creatinine above reference range    Medications: I have reviewed the patient's current medications. Scheduled:  amisulpride  10 mg Intravenous Once   bupivacaine liposome  20 mL Infiltration Once   Chlorhexidine Gluconate Cloth  6 each Topical Once   And   Chlorhexidine Gluconate Cloth  6 each Topical Once   [START ON 02/06/2023] feeding supplement  296 mL Oral Once   feeding supplement  592 mL Oral Once   Continuous:  sodium chloride      Results  for orders placed or performed during the hospital encounter of 02/05/23 (from the past 48 hour(s))  I-STAT, chem 8     Status: Abnormal   Collection Time: 02/05/23  6:59 AM  Result Value Ref Range   Sodium 138 135 - 145 mmol/L   Potassium 3.0 (L) 3.5 - 5.1 mmol/L   Chloride 99 98 - 111 mmol/L   BUN 26 (H) 6 - 20 mg/dL   Creatinine, Ser 6.29 (H) 0.61 - 1.24 mg/dL   Glucose, Bld 96 70 - 99 mg/dL    Comment: Glucose reference range applies only to samples taken after fasting for at least 8 hours.   Calcium, Ion 1.06 (L) 1.15 - 1.40 mmol/L   TCO2 25 22 - 32 mmol/L   Hemoglobin 12.6 (L) 13.0 - 17.0 g/dL   HCT 52.8 (L) 41.3 - 24.4 %    No results found.  Review of Systems  Constitutional:  Negative for chills and fever.  HENT:  Negative for nosebleeds and trouble swallowing.   Eyes:  Negative for pain and visual disturbance.  Respiratory:  Negative for cough, chest tightness, shortness of breath and wheezing.   Cardiovascular:  Negative for chest pain and leg swelling.  Gastrointestinal:  Negative for blood in stool, diarrhea and nausea.  Genitourinary:  Negative for hematuria.  Musculoskeletal:  Negative for back pain and joint swelling.  Skin:  Negative for rash.  Neurological:  Negative for weakness, light-headedness and headaches.   Blood pressure (!) 117/59, pulse 67, temperature (!) 97.4 F (36.3 C), resp. rate 16, height 5\' 8"  (1.727 m), weight 73 kg, SpO2 100%. Physical Exam Vitals and nursing note reviewed.  Constitutional:      Appearance: He is normal weight.     Comments: Status post surgery-appearing tired  HENT:     Head: Normocephalic and atraumatic.     Right Ear: External ear normal.     Left Ear: External ear normal.     Nose: Nose normal.     Mouth/Throat:     Mouth: Mucous membranes are dry.     Pharynx: Oropharynx is clear.  Eyes:     Extraocular Movements: Extraocular movements intact.     Conjunctiva/sclera: Conjunctivae normal.  Cardiovascular:      Rate and Rhythm: Normal rate and regular rhythm.     Heart sounds: Normal heart sounds.  Pulmonary:     Effort: Pulmonary effort is normal.     Breath sounds: Normal breath sounds. No wheezing or rales.  Abdominal:     Comments: Surgical scar intact with intact dressing  Musculoskeletal:  Cervical back: Normal range of motion and neck supple.     Right lower leg: No edema.     Left lower leg: No edema.     Comments: Left upper arm AV fistula, aneurysmal  Skin:    General: Skin is warm and dry.  Neurological:     Mental Status: He is alert and oriented to person, place, and time.  Psychiatric:        Mood and Affect: Mood normal.     Assessment/Plan: 1.  Status post colostomy takedown: Postoperative management per surgical service. 2.  End-stage renal disease: No acute indication for dialysis today and will order for dialysis tomorrow (possibly maintain TTS schedule during the hospitalization). 3.  Hypokalemia: Mildly depressed potassium level, recommend low-dose K-Dur supplement when able to eat/drink. 4.  Anemia of chronic disease: Hemoglobin and hematocrit currently at acceptable range, continue to follow. 5.  Hypertension: Blood pressure is well-controlled postoperatively and off of midodrine.  Midodrine recently discontinued to allow for successful kidney transplant listing. 6.  CKD-MBD: Will follow calcium and phosphorus levels and decide on additional adjustment of binder therapy.  Dagoberto Ligas 02/05/2023, 12:07 PM

## 2023-02-05 NOTE — Op Note (Signed)
02/05/2023  11:46 AM  PATIENT:  Chase [name]  57 y.o. male  Patient Care Team: [facility], [facility] as PCP - Rosezetta Schlatter, MD as Consulting Physician (Urology) Karie Soda, MD as Consulting Physician (General Surgery) Cannon Kettle, MD as Referring Physician (Pain Medicine) Kathlynn Grate, DO (Inactive) as Consulting Physician (Infectious Diseases) Micki Riley, MD as Consulting Physician (Neurology) Iva Boop, MD as Consulting Physician (Gastroenterology) Darnell Level, MD as Consulting Physician (Nephrology)  PRE-OPERATIVE DIAGNOSIS:  COLOSTOMY FOR COLON RESECTION. DESIRE FOR OSTOMY TAKEDOWN HISTORY OF COLOVESICAL FISTULA & PELVIC ABSCESSES DUE TO DIVERTICULITIS S/P HARTMANN COLECTOMY/COLOSTOMY  POST-OPERATIVE DIAGNOSIS:   COLOSTOMY FOR COLON RESECTION. DESIRE FOR OSTOMY TAKEDOWN HISTORY OF COLOVESICAL FISTULA & PELVIC ABSCESSES DUE TO DIVERTICULITIS S/P HARTMANN COLECTOMY/COLOSTOMY   PROCEDURE:   ROBOTIC COLOSTOMY TAKEDOWN FLEXIBLE SIGMOIDOSCOPY TRANSVERSUS ABDOMINIS PLANE (TAP) BLOCK - BILATERAL  SURGEON:  Ardeth Sportsman, MD  ASSISTANT:  Axel Filler, MD  An experienced assistant was required given the standard of surgical care given the complexity of the case.  This assistant was needed for exposure, dissection, suction, tissue approximation, retraction, perception, etc  ANESTHESIA:  General endotracheal intubation anesthesia (GETA) and Regional TRANSVERSUS ABDOMINIS PLANE (TAP) nerve block -BILATERAL for perioperative & postoperative pain control at the level of the transverse abdominis & preperitoneal spaces along the flank at the anterior axillary line, from subcostal ridge to iliac crest under laparoscopic guidance provided with liposomal bupivacaine (Experel) 20mL mixed with 50 mL of bupivicaine 0.25% with epinephrine  Estimated Blood Loss (EBL):   Total I/O In: 100 [IV Piggyback:100] Out: 50 [Blood:50].   (See anesthesia  record)  Delay start of Pharmacological VTE agent (>24hrs) due to concerns of significant anemia, surgical blood loss, or risk of bleeding?:  no  DRAINS: (None)  SPECIMEN:  Colostomy and Distal anastomotic ring (FINAL DISTAL MARGIN)  DISPOSITION OF SPECIMEN:  Pathology  COUNTS:  Sponge, needle, & instrument counts CORRECT  PLAN OF CARE: Admit to inpatient   PATIENT DISPOSITION:  PACU - hemodynamically stable.  INDICATION: Pleasant patient status post colectomy with end ostomy due to diverticulitis with stricture abscesses and colovesical fistula in 2022.  Progressed to end-stage renal failure with nephrostomy tubes and stenting followed closely by [facility].  Status post TURP and biopsies for prostate cancer.  Stabilized.  Nutrition improved.  Patient had episode of stroke and questionable MI without stabilize improved he has been cleared by cardiology.  He is trying to get on the kidney transplant list and request made for colostomy takedown..  The patient has recovered from that surgery and has understandably requested ostomy takedown.  Medically stabilized and felt reasonable to proceed.   I discussed the procedure with the patient:  The anatomy & physiology of the digestive tract was discussed.  The pathophysiology was discussed.  Possibility of remaining with an ostomy permanently was discussed.  I offered ostomy takedown.  Minimally invasive & open techniques were discussed.   Risks such as bleeding, infection, abscess, leak, reoperation, possible re-ostomy, injury to other organs, hernia, heart attack, death, and other risks were discussed.   I noted a good likelihood this will help address the problem.  Goals of post-operative recovery were discussed as well.  We will work to minimize complications.  Questions were answered.  The patient expresses understanding & wishes to proceed with surgery.  OR FINDINGS: Mild adhesions in the abdomen.  Old supraumbilical mesh abutting up to  the end colostomy in the left upper quadrant.  Hold preperitoneal mesh in the left lower quadrant from prior laparoscopic repair.  I believe that this done at the [place].  Patient has an end descending colon to anterior side rectal stump 31 EEA anastomosis that rests 14 cm from the anal verge by flexible sigmoidoscopy.  CASE DATA: Type of patient?: Elective [facility] Private Case Status of Case? Elective Scheduled Infection Present At Time Of Surgery (PATOS)?  NO  DESCRIPTION:   Informed consent was confirmed.  The patient underwent general anaesthesia without difficulty.  The patient was positioned appropriately.  VTE prevention in place.  Patient underwent cystoscopy with ICG firefly insufflation of both ureters by Dr. Gaspar Bidding and with [facility].  No major issues.  Please see his separate operative report.  Disimpacted old mucus balls out of the rectum.  The patient's abdomen was clipped, prepped, & draped in a sterile fashion.  Surgical timeout confirmed our plan.  Peritoneal entry with a laparoscopic port was obtained using Varess spring needle entry technique in the left upper abdomen as the patient was positioned in reverse Trendelenburg.  I induced carbon dioxide insufflation.  No change in end tidal CO2 measurements.  Full symmetrical abdominal distention.  Initial port was carefully placed.  Camera inspection revealed no injury.  Extra ports were carefully placed under direct laparoscopic visualization.  Xi robot carefully docked & instruments placed.  We then continued with minimally invasive exploration.  I worked to freed adhesions to the anterior abdominal wall parietal peritoneum, primarily at old supraumbilical intraperitoneal mesh.  Mostly omentum.Annitta Needs omental adhesions off the colostomy and visceral peritoneum.  Make sure to free all small bowel interloop adhesions and adhesions to the left retroperitoneum and colostomy mesentery and pelvis.  We inspected the small intestine to ensure  there is no injury or other surprises.  Hemostasis was good.    We focused on dissection down in the pelvis.  Work to free adhesions and identify the rectal stump.  Freed off visceral peritoneum along the right and left anterior rectal pelvic reflection.  Work to come behind the rectal stump and the presacral plane to help elevate the mesorectum off the sacral promontory to be sure we adequately identified the rectosigmoid junction.  When down and use EEA sizers to confirm that 31 dilator could pass up to the end of the rectal stump.  Consistent with proximal rectum on the stump and no diverticula seen.  No stricturing or adhesions to the bladder.  With enough mobility we felt we do not need to do any further resection of the remaining rectal stump.    Then mobilized the descending colon mesentery off the retroperitoneum taking care to avoid injury to the ureter.  Mobilized primarily in a lateral medial fashion up towards the splenic flexure.  Got some mobility especially in the retroperitoneum.  Patient had very dense omental adhesions to the left upper quadrant to spleen and his prior gastric bypass.  With good retroperitoneal mobility and a partial splenic flexure mobilization, got much better mobility of the left colon to reach down the pelvis.  Hemostasis was good.  We did reinspection and saw no injury or other concerns.  Felt it was safe to proceed with colostomy takedown.  We made an incision around the ostomy.  I got into the subcutaneous tissues.  I used careful focused right angle dissection and sharp dissection.  Some focused cautery dissection as well.  That helped to free adhesions to the subcutaneous tisses & fascia.  I was able to enter  into the peritoneum focally.  I did a gentle finger sweep.  Gradually came around circumferentially and freed the bowel from remaining adhesions to the abdominal wall.  We were able eviscerate the ostomy to do inspection and assured viability and hemostasis.   Transected the mesentery initially with cautery and then clamped.  Patient had brisk arterial bleeding of the marginal artery that we controlled with silk suture ligature to good result.  I chose as distal the location that was viable for the proximal end of the anastomosis.  I clamped the colon at the point of resection using a reusable pursestringer device.  Passed a 2-0 Keith needle. I transected at the descending/sigmoid junction with a scalpel. I got healthy bleeding mucosa.  We sent the colostomy colon specimen off to go to pathology.  We sized the colon orifice.   I chose a 31mm EEA anvil stapler system.  I reinforced the prolene pursestring with interrupted silk "belt loop" sutures.  I placed the anvil to the open end of the proximal remaining colon and closed around it using the pursestring.     Returned the viscera back into the abdomen and did inspection of the abdomen.  We did copious irrigation with crystalloid solution.  Hemostasis was good.  The distal end of the colon at the handle easily reached down to the rectal stump, therefore, splenic flexure mobilization was not needed.    I scrubbed down and did gentle anal dilation and advanced the EEA stapler up the rectal stump. The spike was brought out at the provimal end of the rectal stump under direct visualization.  Dr. Derrell Lolling attached the anvil of the proximal colon the spike of the stapler. Anvil was tightened down and held clamped for 60 seconds.  Orientation was confirmed such that there is no twisting of the colon nor small bowel underneath the mesenteric defect. No concerning tension.  The EEA stapler was fired and held clamped for 30 seconds. The stapler was released & removed. Blue stitch is in the proximal ring.  Care was taken to ensure no other structures were incorporated within this either.  We noted 2 excellent anastomotic rings.  The colon proximal to the anastomosis was then gently occluded. The pelvis was filled with sterile  irrigation.  I then did flexible sigmoidoscopy transanally.  Was able to come across the colorectal anastomosis transanally.  Hemostasis good.  Mucosa healthy and viable.  Did insufflation with the colon clamp more proximally for a negative air leak test.  Noted that the colorectal anastomosis was at 14 cm from the anal verge consistent with the proximal rectum.  There was no tension of mesentery or bowel at the anastomosis.   Tissues looked viable.  Ureters & bowel uninjured.  The anastomosis looked healthy. Greater omentum was rather foreshortened and would not come down infraumbilically.      We removed CO2 gas out through the ports.  Ports and will protector and instruments removed.  We changed gown and gloves.  The patient was re-draped.  Sterile unused instruments were used from this point out per colon SSI prevention protocol.  I closed the 5mm port sites using Monocryl stitch and sterile dressing.  I closed the fascia of the abdominal wall ostomy wound using running 0 Vicryl for the posterior rectus fascia incorporating the old mesh.  Closed the anterior rectus fascia transversely with running #1 PDS.  Transected the skin transversely to have a more flat bio concave wound.  I closed the skin with some  interrupted Vicryl sutures deep dermal.  I placed antibiotic-soaked umbilical tape with both ends at the corners x 2 this will be removed postop day 3 per protocol.  I placed a sterile dressing.    Patient is being extubated go to recovery room. I discussed postop care with the patient in detail the office & in the holding area. Instructions are written.  Patient did not have any but he wanted me to contact after surgery, so I will respect his wishes.  We will follow him closely.  I had already consulted nephrology.  Dr. Allena Katz is aware and they will help follow the patient and resume his q. Monday Wednesday Friday hemodialysis as an inpatient.  If there are other issues, we will have a low threshold to call  internal medicine hospitalist as well.   Ardeth Sportsman, M.D., F.A.C.S. Gastrointestinal and Minimally Invasive Surgery [facility] [address] [place], [place] [address] [phone] Main / Paging

## 2023-02-05 NOTE — Transfer of Care (Signed)
Immediate Anesthesia Transfer of Care Note  Patient: Chase [name]  Procedure(s) Performed: XI ROBOTIC ASSISTED OSTOMY TAKEDOWN LYSIS OF ADHESION RIGDI PROCTOSCOPY CYSTOSCOPY with FIREFLY INJECTION  Patient Location: PACU  Anesthesia Type:General  Level of Consciousness: awake, oriented, and patient cooperative  Airway & Oxygen Therapy: Patient Spontanous Breathing  Post-op Assessment: Report given to RN  Post vital signs: stable  Last Vitals:  Vitals Value Taken Time  BP 117/59 02/05/23 1200  Temp 36.3 C 02/05/23 1200  Pulse 72 02/05/23 1204  Resp 17 02/05/23 1204  SpO2 100 % 02/05/23 1204  Vitals shown include unfiled device data.  Last Pain:  Vitals:   02/05/23 1200  PainSc: Asleep         Complications: No notable events documented.

## 2023-02-05 NOTE — Op Note (Signed)
Preoperative diagnosis:  1. Colostomy, desire for ostomy takedown  Postoperative diagnosis: 1. same  Procedure(s): 1. Cystoscopy with bilateral ureteral catheterization for firefly injection  Surgeon: Dr. Irine Seal  Anesthesia: general  Complications: none  EBL: <1 cc  Intraoperative findings: unremarkable bladder; s/p TURP  Indication: identification of ureters for abdominal/pelvic surgery  Description of procedure:   With appropriate consent having been obtained, the patient was brought to the operative suite. Anesthesia was induced and the patient was prepped and draped in the usual sterile fashion. A timeout was performed  Thereafter the rigid cystoscope was carefully inserted into the bladder. The bladder was inspected thoroughly and no abnormalities were identified.   The right ureteral orifice was identified and cannulated with a 6 fr open ended ureteral catheter. It was easily advanced to the level of the collecting system. The ureteral catheter was then slowly withdrawn while simultaneously injecting the firefly solution over the length of the ureter. Approximately 8 cc total were injected.  The procedure was then repeated in an identical fashion on the left.   A 16 fr foley was then placed in a sterile fashion with immediate return of clear blue/green urine, and the balloon was inflated with 10 cc sterile water.   Irine Seal MD 02/05/2023, 9:50 AM  [facility]  Pager: [phone]

## 2023-02-05 NOTE — Discharge Instructions (Addendum)
SURGERY: POST OP INSTRUCTIONS (Surgery for small bowel obstruction, colon resection, etc)   ######################################################################  EAT Gradually transition to a high fiber diet with a fiber supplement over the next few days after discharge  WALK Walk an hour a day.  Control your pain to do that.    CONTROL PAIN Control pain so that you can walk, sleep, tolerate sneezing/coughing, go up/down stairs.  HAVE A BOWEL MOVEMENT DAILY Keep your bowels regular to avoid problems.  OK to try a laxative to override constipation.  OK to use an antidairrheal to slow down diarrhea.  Call if not better after 2 tries  CALL IF YOU HAVE PROBLEMS/CONCERNS Call if you are still struggling despite following these instructions. Call if you have concerns not answered by these instructions  ######################################################################   DIET Follow a light diet the first few days at home.  Start with a bland diet such as soups, liquids, starchy foods, low fat foods, etc.  If you feel full, bloated, or constipated, stay on a ful liquid or pureed/blenderized diet for a few days until you feel better and no longer constipated. Be sure to drink plenty of fluids every day to avoid getting dehydrated (feeling dizzy, not urinating, etc.). Gradually add a fiber supplement to your diet over the next week.  Gradually get back to a regular solid diet.  Avoid fast food or heavy meals the first week as you are more likely to get nauseated. It is expected for your digestive tract to need a few months to get back to normal.  It is common for your bowel movements and stools to be irregular.  You will have occasional bloating and cramping that should eventually fade away.  Until you are eating solid food normally, off all pain medications, and back to regular activities; your bowels will not be normal. Focus on eating a low-fat, high fiber diet the rest of your life  (See Getting to Good Bowel Health, below).  CARE of your INCISION or WOUND  It is good for closed incisions and even open wounds to be washed every day.  Shower every day.  Short baths are fine.  Wash the incisions and wounds clean with soap & water.    You may leave closed incisions open to air if it is dry.   You may cover the incision with clean gauze & replace it after your daily shower for comfort.  TEGADERM & WICKS:  You have clear gauze band-aid dressings over your closed incision(s).  Remove the dressings & shoelace ribbon wicks in your largest incision 3 days after surgery.= 02/08/2023 Saturday    If you have an open wound with a wound vac, see wound vac care instructions.    ACTIVITIES as tolerated Start light daily activities --- self-care, walking, climbing stairs-- beginning the day after surgery.  Gradually increase activities as tolerated.  Control your pain to be active.  Stop when you are tired.  Ideally, walk several times a day, eventually an hour a day.   Most people are back to most day-to-day activities in a few weeks.  It takes 4-8 weeks to get back to unrestricted, intense activity. If you can walk 30 minutes without difficulty, it is safe to try more intense activity such as jogging, treadmill, bicycling, low-impact aerobics, swimming, etc. Save the most intensive and strenuous activity for last (Usually 4-8 weeks after surgery) such as sit-ups, heavy lifting, contact sports, etc.  Refrain from any intense heavy lifting or straining until you are  off narcotics for pain control.  You will have off days, but things should improve week-by-week. DO NOT PUSH THROUGH PAIN.  Let pain be your guide: If it hurts to do something, don't do it.  Pain is your body warning you to avoid that activity for another week until the pain goes down. You may drive when you are no longer taking narcotic prescription pain medication, you can comfortably wear a seatbelt, and you can safely make  sudden turns/stops to protect yourself without hesitating due to pain. You may have sexual intercourse when it is comfortable. If it hurts to do something, stop.   MEDICATIONS Take your usually prescribed home medications unless otherwise directed.   Blood thinners:  You can restart any strong blood thinners after the second postoperative day.  It is OK to continue aspirin before & after surgery..    Some blood in BMs the first 1-2 weeks is common but should taper down & be small volume.  If you are passing many large clots, call your surgeon    PAIN CONTROL Pain after surgery or related to activity is often due to strain/injury to muscle, tendon, nerves and/or incisions.  This pain is usually short-term and will improve in a few months.  To help speed the process of healing and to get back to regular activity more quickly, DO THE FOLLOWING THINGS TOGETHER: Increase activity gradually.  DO NOT PUSH THROUGH PAIN Use Ice and/or Heat Try Gentle Massage and/or Stretching Take over the counter pain medication Take Narcotic prescription pain medication for more severe pain  Good pain control = faster recovery.  It is better to take more medicine to be more active than to stay in bed all day to avoid medications.  Increase activity gradually Avoid heavy lifting at first, then increase to lifting as tolerated over the next 6 weeks. Do not "push through" the pain.  Listen to your body and avoid positions and maneuvers than reproduce the pain.  Wait a few days before trying something more intense Walking an hour a day is encouraged to help your body recover faster and more safely.  Start slowly and stop when getting sore.  If you can walk 30 minutes without stopping or pain, you can try more intense activity (running, jogging, aerobics, cycling, swimming, treadmill, sex, sports, weightlifting, etc.) Remember: If it hurts to do it, then don't do it! Use Ice and/or Heat You will have swelling and  bruising around the incisions.  This will take several weeks to resolve. Ice packs or heating pads (6-8 times a day, 30-60 minutes at a time) will help sooth soreness & bruising. Some people prefer to use ice alone, heat alone, or alternate between ice & heat.  Experiment and see what works best for you.  Consider trying ice for the first few days to help decrease swelling and bruising; then, switch to heat to help relax sore spots and speed recovery. Shower every day.  Short baths are fine.  It feels good!  Keep the incisions and wounds clean with soap & water.   Try Gentle Massage and/or Stretching Massage at the area of pain many times a day Stop if you feel pain - do not overdo it Take over the counter pain medication This helps the muscle and nerve tissues become less irritable and calm down faster Choose ONE of the following over-the-counter anti-inflammatory medications: Acetaminophen 500mg  tabs (Tylenol) 1-2 pills with every meal and just before bedtime (avoid if you have liver problems  or if you have acetaminophen in you narcotic prescription) Naproxen 220mg  tabs (ex. Aleve, Naprosyn) 1-2 pills twice a day (avoid if you have kidney, stomach, IBD, or bleeding problems) Ibuprofen 200mg  tabs (ex. Advil, Motrin) 3-4 pills with every meal and just before bedtime (avoid if you have kidney, stomach, IBD, or bleeding problems) Take with food/snack several times a day as directed for at least 2 weeks to help keep pain / soreness down & more manageable. Take Narcotic prescription pain medication for more severe pain A prescription for strong pain control is often given to you upon discharge (for example: oxycodone/Percocet, hydrocodone/Norco/Vicodin, or tramadol/Ultram) Take your pain medication as prescribed. Be mindful that most narcotic prescriptions contain Tylenol (acetaminophen) as well - avoid taking too much Tylenol. If you are having problems/concerns with the prescription medicine (does  not control pain, nausea, vomiting, rash, itching, etc.), please call us [phone] to see if we need to switch you to a different pain medicine that will work better for you and/or control your side effects better. If you need a refill on your pain medication, you must call the office before 4 pm and on weekdays only.  By federal law, prescriptions for narcotics cannot be called into a pharmacy.  They must be filled out on paper & picked up from our office by the patient or authorized caretaker.  Prescriptions cannot be filled after 4 pm nor on weekends.    WHEN TO CALL us [phone] Severe uncontrolled or worsening pain  Fever over 101 F (38.5 C) Concerns with the incision: Worsening pain, redness, rash/hives, swelling, bleeding, or drainage Reactions / problems with new medications (itching, rash, hives, nausea, etc.) Nausea and/or vomiting Difficulty urinating Difficulty breathing Worsening fatigue, dizziness, lightheadedness, blurred vision Other concerns If you are not getting better after two weeks or are noticing you are getting worse, contact our office [phone] for further advice.  We may need to adjust your medications, re-evaluate you in the office, send you to the emergency room, or see what other things we can do to help. The clinic staff is available to answer your questions during regular business hours (8:30am-5pm).  Please don't hesitate to call and ask to speak to one of our nurses for clinical concerns.    A surgeon from [facility] is always on call at the hospitals 24 hours/day If you have a medical emergency, go to the nearest emergency room or call 911.  FOLLOW UP in our office One the day of your discharge from the hospital (or the next business weekday), please call [facility] to set up or confirm an appointment to see your surgeon in the office for a follow-up appointment.  Usually it is 2-3 weeks after your surgery.   If you  have skin staples at your incision(s), let the office know so we can set up a time in the office for the nurse to remove them (usually around 10 days after surgery). Make sure that you call for appointments the day of discharge (or the next business weekday) from the hospital to ensure a convenient appointment time. IF YOU HAVE DISABILITY OR FAMILY LEAVE FORMS, BRING THEM TO THE OFFICE FOR PROCESSING.  DO NOT GIVE THEM TO YOUR DOCTOR.  [facility] [address], [address], [place], [place]  [address] ? [phone] - Main [phone] - Toll Free,  [phone] - Fax [email or web address]    GETTING TO GOOD BOWEL HEALTH. It is expected for  your digestive tract to need a few months to get back to normal.  It is common for your bowel movements and stools to be irregular.  You will have occasional bloating and cramping that should eventually fade away.  Until you are eating solid food normally, off all pain medications, and back to regular activities; your bowels will not be normal.   Avoiding constipation The goal: ONE SOFT BOWEL MOVEMENT A DAY!    Drink plenty of fluids.  Choose water first. TAKE A FIBER SUPPLEMENT EVERY DAY THE REST OF YOUR LIFE During your first week back home, gradually add back a fiber supplement every day Experiment which form you can tolerate.   There are many forms such as powders, tablets, wafers, gummies, etc Psyllium bran (Metamucil), methylcellulose (Citrucel), Miralax or Glycolax, Benefiber, Flax Seed.  Adjust the dose week-by-week (1/2 dose/day to 6 doses a day) until you are moving your bowels 1-2 times a day.  Cut back the dose or try a different fiber product if it is giving you problems such as diarrhea or bloating. Sometimes a laxative is needed to help jump-start bowels if constipated until the fiber supplement can help regulate your bowels.  If you are tolerating eating & you are farting, it is okay to try a gentle  laxative such as double dose MiraLax, prune juice, or Milk of Magnesia.  Avoid using laxatives too often. Stool softeners can sometimes help counteract the constipating effects of narcotic pain medicines.  It can also cause diarrhea, so avoid using for too long. If you are still constipated despite taking fiber daily, eating solids, and a few doses of laxatives, call our office. Controlling diarrhea Try drinking liquids and eating bland foods for a few days to avoid stressing your intestines further. Avoid dairy products (especially milk & ice cream) for a short time.  The intestines often can lose the ability to digest lactose when stressed. Avoid foods that cause gassiness or bloating.  Typical foods include beans and other legumes, cabbage, broccoli, and dairy foods.  Avoid greasy, spicy, fast foods.  Every person has some sensitivity to other foods, so listen to your body and avoid those foods that trigger problems for you. Probiotics (such as active yogurt, Align, etc) may help repopulate the intestines and colon with normal bacteria and calm down a sensitive digestive tract Adding a fiber supplement gradually can help thicken stools by absorbing excess fluid and retrain the intestines to act more normally.  Slowly increase the dose over a few weeks.  Too much fiber too soon can backfire and cause cramping & bloating. It is okay to try and slow down diarrhea with a few doses of antidiarrheal medicines.   Bismuth subsalicylate (ex. Kayopectate, Pepto Bismol) for a few doses can help control diarrhea.  Avoid if pregnant.   Loperamide (Imodium) can slow down diarrhea.  Start with one tablet (2mg ) first.  Avoid if you are having fevers or severe pain.  ILEOSTOMY PATIENTS WILL HAVE CHRONIC DIARRHEA since their colon is not in use.    Drink plenty of liquids.  You will need to drink even more glasses of water/liquid a day to avoid getting dehydrated. Record output from your ileostomy.  Expect to empty  the bag every 3-4 hours at first.  Most people with a permanent ileostomy empty their bag 4-6 times at the least.   Use antidiarrheal medicine (especially Imodium) several times a day to avoid getting dehydrated.  Start with a dose at bedtime & breakfast.  Adjust up or down as needed.  Increase antidiarrheal medications as directed to avoid emptying the bag more than 8 times a day (every 3 hours). Work with your wound ostomy nurse to learn care for your ostomy.  See ostomy care instructions. TROUBLESHOOTING IRREGULAR BOWELS 1) Start with a soft & bland diet. No spicy, greasy, or fried foods.  2) Avoid gluten/wheat or dairy products from diet to see if symptoms improve. 3) Miralax 17gm or flax seed mixed in 8oz. water or juice-daily. May use 2-4 times a day as needed. 4) Gas-X, Phazyme, etc. as needed for gas & bloating.  5) Prilosec (omeprazole) over-the-counter as needed 6)  Consider probiotics (Align, Activa, etc) to help calm the bowels down  Call your doctor if you are getting worse or not getting better.  Sometimes further testing (cultures, endoscopy, X-ray studies, CT scans, bloodwork, etc.) may be needed to help diagnose and treat the cause of the diarrhea. [facility] [address], [place], [place]  [address] [phone] - Main.    [phone]  - Toll Free.   [phone] - Fax [email or web address]

## 2023-02-05 NOTE — H&P (Signed)
02/05/2023   REFERRING PHYSICIAN: Self  Patient Care Team: [facility], [facility] as PCP - General Borden, Lyndal Pulley., MD (Urology) Cephus Shelling, MD (Vascular Surgery) Deirdre Evener, MD (Internal Medicine)  PROVIDER: Jarrett Soho, MD  [facility] MRN: [id number] DOB: 1965/07/01 DATE OF ENCOUNTER: 12/04/2022  SUBJECTIVE   Chief Complaint: Follow-up (NEW PROBLEM - Eval. ostomy takedown/)   History of Present Illness: Chase [name] is a 57 y.o. male who is seen today  as an office consultation at the request of Dr. Seymour Bars  for evaluation of Follow-up (NEW PROBLEM - Eval. ostomy takedown/)    Patient returns a year and a half. He had presented with severe diverticulitis suspicious for colovesical fistula along with bladder outlet obstruction requiring stenting's and PERC nephrostomy tube. I did a Hartmann resection on him January 2022. He has had intermittent nephrostomy tubes and stents. A lot of issues with compliance and needing care through the [place]. Eventually had worsening renal function. However he has gained weight and gotten healthier. [facility]  He is dialysis dependent Monday Wednesday Friday at the [facility] through a left upper arm AV fistula.Marland Kitchen He is is hoping for his colostomy to be taken down. He has been following up with [facility] Dr. Laverle Patter. Found to have prostate cancer Apparently 1/12 biopsies noted so very early in most likely going to be monitoring only. Had a TURP for some chronic BPH issues. It took some time for the past year and a half but now the patient has underwent cardiac clearance through the [place]. Acceptable risk. Patient got colonoscopy done last month which showed no masses or tumors in the colon or rectal stump. Patient is working to try and be on the kidney transplant list and was told he needed his colostomy taken down so he is asking that happen.  He moves his bowels 2-3 times a day. No severe  diarrhea on Imodium needing anymore. He usually can keep his bag on most days. Has to wear tape for some occasional leaking. He does walk with a cane. Can do about 10 minutes. He does have some mild urinary incontinence and wears diapers. However no more nephrostomy tubes. I believe no more stenting. He walks with a cane. I think he is working maybe to have some Agricultural engineer available. He is not on any blood thinners.  PRIOR VISIT 04/24/2021 Patient returns a year after having surgery. Veteran. Had chronic colovesical fistula. Worsening hydronephrosis and chronic kidney disease. Followed by urology Dr. Laverle Patter and myself. Has a problem of no showing. A challenge to get to surgery. Recurrent episodes of urosepsis. Intentionally readmitted. Noncompliant with bowel prep. Worsening decline. Ended up having to do a laparoscopic Hartmann resection with colovesical fistula repair and urology has done intermittent ureteral stent exchange. Patient declined colostomy care through [facility] health and wished to go back to the [place] health system. Patient had recurring hydronephrosis issues and has progressed to kidney failure requiring an AV fistula. Now on dialysis. Looks like the [place] health system was trying to get him back to see [place] last fall. Try to get him to show up to clinic but he has no showed. History of PTSD and personality disorder with struggle of intermittent noncompliance. Now a year later he is coming in interested in colostomy takedown.  Patient comes today by himself. He is back to get his care at the [facility] system. Have not seen him in a year. His main  complaint is that he has a lot of loose bowel movements in his colostomy and gets accidents and explosions. Sometimes has to replace the appliance a couple times a day. Sounds like he occasionally use Imodium but he cannot tell me what he consistently does. Looks like he averages between 1-3 a day. Not on a fiber supplement. He finds it frustrating embarrassing. It  sounds like he still has stents in his ureters and is followed by [facility]. He believes he is going to see Dr. Laverle Patter next week with [facility]. He walks with a cane. He does note that sometimes he uses a scooter to get around at the dialysis center. He has had some mildly elevated glucoses but does not recall being told he has diabetes. Has not smoked in 20 years. He has gained a lot of weight back and is now 170 pounds. He was in the 110s.  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   Allergies  Allergen Reactions  Nsaids (Non-Steroidal Anti-Inflammatory Drug) Other (See Comments)  Bleeding GI Ulceration Bleeding GI Ulceration  Aspirin Other (See Comments)  Ciprofloxacin Nausea And Vomiting   Current Outpatient Medications on File Prior to Visit  Medication Sig Dispense Refill  calcium acetate,phosphat bind, (PHOSLO) 667 mg capsule Take by mouth  FUROsemide (LASIX) 80 MG tablet Take by mouth  acetaminophen (TYLENOL) 500 MG tablet TAKE ONE TABLET BY MOUTH THREE TIMES A DAY AS NEEDED FOR CHRONIC PAIN  amitriptyline (ELAVIL) 10 MG tablet TAKE ONE TABLET BY MOUTH EVERY EVENING AT 8PM FOR MUSCLE AND NERVE PAIN  aspirin 81 MG EC tablet TAKE 1 TABLET BY MOUTH EVERY DAY WITH BREAKFAS. SWALLOW WHOLE (Patient not taking: Reported on 12/04/2022)  atorvastatin (LIPITOR) 10 MG tablet Take 1 tablet by mouth once daily (Patient not taking: Reported on 12/04/2022)  calcium carbonate (TUMS) 200 mg calcium (500 mg) chewable tablet Take by mouth  cyanocobalamin (VITAMIN B12) 1,000 mcg/mL  injection Inject into the muscle  DULoxetine (CYMBALTA) 30 MG DR capsule Take 30 mg by mouth 2 (two) times daily  gabapentin (NEURONTIN) 100 MG capsule TAKE 2 CAPSULES (200 MG TOTAL) BY MOUTH DAILY.  hydrOXYzine HCL (ATARAX) 10 MG tablet TAKE ONE TABLET BY MOUTH THREE TIMES A DAY AS NEEDED (MAY CAUSE DROWSINESS)  midodrine (PROAMATINE) 10 MG tablet Take 1 tablet by mouth once daily  naloxone (NARCAN) 4 mg/actuation nasal spray SPRAY 1 SPRAY INTO ONE NOSTRIL AS DIRECTED FOR OPIOID OVERDOSE (TURN PERSON ON SIDE AFTER DOSE. IF NO RESPONSE IN 2-3 MINUTES OR PERSON RESPONDS BUT RELAPSES, REPEAT USING A NEW SPRAY DEVICE AND SPRAY INTO THE OTHER NOSTRIL. CALL 911 AFTER USE.) * EMERGENCY USE ONLY *  oxyCODONE (ROXICODONE) 5 MG immediate release tablet TAKE ONE TABLET BY MOUTH THREE TIMES A DAY AS NEEDED FOR BREAKTHROUGH CHRONIC PAIN (5/3-OVERNIGHT) (Patient not taking: Reported on 12/04/2022)  pantoprazole (PROTONIX) 20 MG DR tablet TAKE ONE TABLET BY MOUTH TWICE A DAY (TAKE ON AN EMPTY STOMACH 30 MINUTES PRIOR TO A MEAL)  polyethylene glycol (MIRALAX) powder Take by mouth (Patient not taking: Reported on 12/04/2022)  prazosin (MINIPRESS) 1 MG  capsule Take 1 capsule by mouth at bedtime  silodosin (RAPAFLO) 8 mg capsule Take by mouth (Patient not taking: Reported on 12/04/2022)  tamsulosin (FLOMAX) 0.4 mg capsule Take 1 capsule by mouth at bedtime (Patient not taking: Reported on 12/04/2022)  traMADoL (ULTRAM) 50 mg tablet Take by mouth every 8 (eight) hours as needed   No current facility-administered medications on file prior to visit.   Family History  Problem Relation Age of Onset  High blood pressure (Hypertension) Mother    Social History   Tobacco Use  Smoking Status Former  Types: Cigarettes  Smokeless Tobacco Never    Social History   Socioeconomic History  Marital status: Widowed  Tobacco Use  Smoking status: Former  Types: Cigarettes  Smokeless tobacco: Never  Vaping Use  Vaping  status: Never Used  Substance and Sexual Activity  Alcohol use: Never  Drug use: Never   Social Determinants of Health   Food Insecurity: No Food Insecurity (01/24/2022)  [facility]  Hunger Vital Sign  Worried About Running Out of Food in the Last Year: Never true  Ran Out of Food in the Last Year: Never true  Transportation Needs: No Transportation Needs (01/24/2022)  Received from [facility] - Transportation  Lack of Transportation (Medical): No  Lack of Transportation (Non-Medical): No  [facility]  Social Network   ############################################################  Review of Systems: A complete review of systems (ROS) was obtained from the patient. I have reviewed this information and discussed as appropriate with the patient. See HPI as well for other pertinent ROS.  Constitutional: No fevers, chills, sweats. Weight stable Eyes: No vision changes, No discharge HENT: No sore throats, nasal drainage Lymph: No neck swelling, No bruising easily Pulmonary: No cough, productive sputum CV: No orthopnea, PND Patient walks 10 minutes for about 1/4 miles without difficulty. No exertional chest/neck/shoulder/arm pain.  GI: No personal nor family history of GI/colon cancer, inflammatory bowel disease, irritable bowel syndrome, allergy such as Celiac Sprue, dietary/dairy problems, colitis, ulcers nor gastritis. No recent sick contacts/gastroenteritis. No travel outside the country. No changes in diet.  Renal: No more UTIs, No hematuria Genital: No drainage, bleeding, masses Musculoskeletal: No severe joint pain. Good ROM major joints Skin: No sores or lesions Heme/Lymph: No easy bleeding. No swollen lymph nodes  OBJECTIVE   There were no vitals filed for this visit.   There is no height or weight on file to calculate BMI.  PHYSICAL EXAM:  Constitutional: Not cachectic. Weight in the much more normal range now.  Hygeine adequate. Vitals signs as above.  Eyes: Pupils reactive, normal extraocular movements. Sclera nonicteric Neuro: CN II-XII intact. No major focal sensory defects. No major motor deficits. Lymph: No head/neck/groin lymphadenopathy Psych: No severe agitation. No severe anxiety. Still with some pressured speech and interrupting often. Occasionally irritated/frustrated but mostly consolable. Not quite to the point of mania. Judgment & insight Adequate, Oriented x4, HENT: Normocephalic, Mucus membranes moist. No thrush. Hearing: adequate Neck: Supple, No tracheal deviation. No obvious thyromegaly Chest: No pain to chest wall compression. Good respiratory excursion. No audible wheezing CV: Pulses intact. Regular rhythm. No major extremity edema Ext: No obvious deformity or contracture. Edema: Not present. No cyanosis Skin: No major subcutaneous nodules. Warm and dry Musculoskeletal: Severe joint rigidity not present. No obvious clubbing. No digital petechiae. Mobility: uses cane moving easily without restrictions Patient claims uses a rolling walker at dialysis.  Abdomen: Left upper quadrant colostomy in place pink  with good mucosa. No parastomal hernia. No guarding. Flat Somewhat firm. Nondistended. Nontender. Hernia: Not present. Diastasis recti: Mild supraumbilical midline. No hepatomegaly. No splenomegaly.  Genital/Pelvic: Inguinal hernia: Not present. Inguinal lymph nodes: without lymphadenopathy nor hidradenitis.   Rectal: Perianal skin clear. Intact sphincter function with low range but normal squeeze. No anal rectal masses. Posterior midline scarring given history of prior anal fissure but not active. Hemorrhoids grade 1 or 2. Scant mucus balls and up mid rectum. I cannot feel the upper end of the rectal stump at 7cm    ###################################################################  Labs, Imaging and Diagnostic Testing:  Located in 'Care Everywhere' section of Epic EMR  chart  PRIOR [facility] CLINIC NOTES:  Located in 'Care Everywhere' section of Epic EMR chart  SURGERY NOTES:  Located in 'Care Everywhere' section of Epic EMR chart   04/18/2020  POST-OPERATIVE DIAGNOSIS:  SIGMOID DIVERTICULITIS EPIGASTRIC INCISIONAL HERNIA  PROCEDURE:  LAPAROSCOPIC SIGMOID COLOTOMY WITH COLOSTOMY (HARTMANN) INCISIONAL EPIGASTRIC HERNIA REPAIR - PRIMARY SUTURE  SURGEON: Ardeth Sportsman, MD  OR FINDINGS:   Patient had very redundant stretched out sigmoid colon. Inflammation at the descending/sigmoid junction along the left lateral pelvis. Only real area of adhesion to the pelvis. No strong evidence of any active colovesical fistula.  Hartmann resection done of redundant sigmoid colon. Prolene sutures at rectal stump.  It is an end descending colostomy in the left upper paramedian region at premarked location just inferior to intraperitoneal polypropylene mesh from prior incisional hernia repair   1 cm epigastric hernia containing preperitoneal fat only. Primarily repaired  PATHOLOGY:  Located in 'Care Everywhere' section of Epic EMR chart  Clinical History: Colo vesical fistula (ms)   FINAL MICROSCOPIC DIAGNOSIS:   A. 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  He gets dialysis Monday Wednesday Friday through the [place].  [facility].  He got colonoscopy last month through the [place] that was rather underwhelming.  Sounds like he has very early prostate cancer being monitored [facility] with bladder outlet obstruction palliated with TURP 11 months ago.  I think it is technically feasible to do end colostomy takedown. MIS robotic approach. Most likely have to be at [facility] campus so that nephrology can help follow and do dialysis qMWF.  The anatomy & physiology of the digestive tract was discussed. The pathophysiology was discussed. Possibility of remaining with an ostomy permanently was discussed. I offered ostomy takedown. Laparoscopic & open techniques were discussed.   Risks such as bleeding, infection, abscess, leak, reoperation, possible re-ostomy, injury to other organs, need for repair of tissues / organs, need for further treatment, hernia, heart attack, death, and other risks were discussed. I noted a good likelihood this will help address the problem. Goals of post-operative recovery were discussed as well. We will work to minimize complications. Questions were answered. The patient expresses understanding & wishes to proceed with surgery.   Ardeth Sportsman, MD, FACS, MASCRS  Esophageal, Gastrointestinal & Colorectal Surgery Robotic and Minimally Invasive Surgery  [facility] [address], [address] [place], [place] [address] [phone] Fax [phone] Main  CONTACT INFORMATION: Weekday (9AM-5PM): Call [facility] main office at [phone] Weeknight (5PM-9AM) or Weekend/Holiday: Check EPIC "Web Links" tab & use "AMION" (password " [email or web address]") for General Surgery [facility] coverage  Please, DO NOT use SecureChat  (it is not reliable communication to reach operating surgeons & will lead to a delay in care).   Epic staff messaging available for outptient concerns needing 1-2 business day response.      02/05/2023

## 2023-02-05 NOTE — Interval H&P Note (Signed)
History and Physical Interval Note:  02/05/2023 7:32 AM  Chase [name]  has presented today for surgery, with the diagnosis of COLOSTOMY FOR COLON RESECTION. DESIRE FOR OSTOMY TAKEDOWN.  The various methods of treatment have been discussed with the patient and family. After consideration of risks, benefits and other options for treatment, the patient has consented to  Procedure(s) with comments: XI ROBOTIC ASSISTED OSTOMY TAKEDOWN (N/A) - 210 TOTAL INCLUDING [facility] TIME LYSIS OF ADHESION (N/A) RIGDI PROCTOSCOPY (N/A) CYSTOSCOPY with FIREFLY INJECTION (N/A) as a surgical intervention.  The patient's history has been reviewed, patient examined, no change in status, stable for surgery.  I have reviewed the patient's chart and labs.  Questions were answered to the patient's satisfaction.    I have re-reviewed the the patient's records, history, medications, and allergies.  I have re-examined the patient.  I again discussed intraoperative plans and goals of post-operative recovery.  The patient agrees to proceed.  Chase [name]  28-Sep-1965 [id number]  Patient Care Team: [facility], [facility] as PCP - Rosezetta Schlatter, MD as Consulting Physician (Urology) Karie Soda, MD as Consulting Physician (General Surgery) Cannon Kettle, MD as Referring Physician (Pain Medicine) Kathlynn Grate, DO (Inactive) as Consulting Physician (Infectious Diseases) Micki Riley, MD as Consulting Physician (Neurology) Iva Boop, MD as Consulting Physician (Gastroenterology) Darnell Level, MD as Consulting Physician (Nephrology)  Patient Active Problem List   Diagnosis Date Noted   [facility]) 04/20/2020    Priority: High   PTSD (post-traumatic stress disorder)     Priority: High   Acute kidney failure (HCC) 08/24/2019    Priority: High   Bilateral ureteral obstruction s/p perc nephrosotmy & stenting 12/30/2019    Priority: Medium    Pelvic abscess with  colovescial fistula 11/15/2019    Priority: Medium    Protein-calorie malnutrition, severe 10/23/2019    Priority: Medium    History of gastric bypass 08/24/2019    Priority: Medium    History of CVA (cerebrovascular accident) 12/31/2019    Priority: Low   Cerebral embolism with cerebral infarction 10/18/2019    Priority: Low   Urinary retention due to benign prostatic hyperplasia 01/24/2022   Hyperkalemia    Malnutrition of moderate degree 11/24/2020   Metabolic encephalopathy 11/23/2020   ESRD (end stage renal disease) (HCC) 11/23/2020   Complicated UTI (urinary tract infection) 11/23/2020   Pressure ulcer 10/19/2020   Acute renal failure (ARF) (HCC) 10/07/2020   Acute renal failure superimposed on stage 4 chronic kidney disease (HCC) 10/06/2020   Sepsis with acute renal failure (HCC)    Anemia of chronic disease    Acute urinary retention    Bilateral hydronephrosis    Acute metabolic encephalopathy    Acute kidney injury superimposed on chronic kidney disease (HCC)    Delirium    Kidney failure 06/27/2020   Pressure injury of skin 06/27/2020   Physical deconditioning    Personality disorder  04/19/2020   Diverticulitis of sigmoid colon with abscess s/p colectomy/colostomy Chase [name]) 04/18/2020 04/18/2020   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[facility]) 04/18/2020   Acute respiratory failure with hypoxia (HCC)    DNR (do not resuscitate) discussion    Palliative care by specialist    Adult failure to thrive    Sepsis with acute renal failure without septic shock (HCC) 03/31/2020   Incisional hernia - epigastric 01/04/2020   Chronic narcotic use 01/02/2020   Anemia in chronic kidney disease 01/02/2020   ATN (acute tubular necrosis) (HCC) 12/31/2019  Candida UTI 12/31/2019   Electrolyte abnormality 12/31/2019   Increased anion gap metabolic acidosis 12/31/2019   Microcytic anemia 12/31/2019   ABLA (acute blood loss anemia) 12/31/2019   Anxiety and depression 12/31/2019   BMI less than  19,adult 12/31/2019   AKI (acute kidney injury) (HCC) 12/24/2019   E. coli UTI (urinary tract infection) 11/15/2019   Chemical exposure    Cervical radiculopathy    Shock (HCC) 11/08/2019   Acute left-sided weakness 10/17/2019   Chronic pain 10/17/2019   Fall 10/17/2019   Pseudomonas urinary tract infection 08/24/2019   BPH (benign prostatic hyperplasia) 08/24/2019   Anemia 08/24/2019   Dyspnea 08/24/2019   History of COVID-19 08/24/2019   History of elevated PSA 08/24/2019   Hyponatremia 08/24/2019   Metabolic acidosis 08/24/2019   Skin lesion of neck 08/24/2019   Toxic metabolic encephalopathy 08/24/2019   Cervical spondylosis without myelopathy 06/19/2016   Neck pain 09/04/2015   History of stomach ulcers 05/19/2013   Erectile dysfunction 05/19/2013   Hodgkin lymphoma, unspecified, unspecified site (HCC) 12/10/2012   Iron deficiency anemia 12/10/2012   Pernicious anemia 12/10/2012   Arthralgia of multiple sites 04/20/2012   Postlaminectomy syndrome, not elsewhere classified 04/20/2012    Past Medical History:  Diagnosis Date   Anemia    Arthritis    Cervical radiculopathy    with left sided weakness   Chemical exposure    service related injury   Chronic pain    CKD (chronic kidney disease), stage IV (HCC) 10/17/2019   COVID 2020   COVID-19    Dyspnea    GERD (gastroesophageal reflux disease)    GIB (gastrointestinal bleeding)    Headache    Heart attack (HCC)    History of CVA (cerebrovascular accident) 12/31/2019   History of gastric bypass 08/24/2019   History of stomach ulcers 05/19/2013   Formatting of this note might be different from the original. Attributed to chronic ibuprofen use   Hodgkin lymphoma, unspecified, unspecified site (HCC) 12/10/2012   Formatting of this note might be different from the original. In remission.   Hypokalemia    Hypomagnesemia    Interatrial cardiac shunt    06/09/18 agitated bubble study ([facility]): moderate right-to-left  interatrial shunt   Pneumonia    walking pneumonia   PTSD (post-traumatic stress disorder)    Renal disorder    Dialysis T/Th/Sa   Stroke [facility]) 2019   Urinary retention    due to bladder outlet obstruction    Past Surgical History:  Procedure Laterality Date   AV FISTULA PLACEMENT Left 10/16/2020   Procedure: LEFT ARM ARTERIOVENOUS BRACHIOBASILIC FISTULA CREATION;  Surgeon: Cephus Shelling, MD;  Location: [facility] OR;  Service: Vascular;  Laterality: Left;   BASCILIC VEIN TRANSPOSITION Left 02/05/2021   Procedure: LEFT SECOND STAGE BASILIC VEIN TRANSPOSITION;  Surgeon: Cephus Shelling, MD;  Location: [facility] OR;  Service: Vascular;  Laterality: Left;   CHOLECYSTECTOMY     COLONOSCOPY     COLONOSCOPY WITH PROPOFOL N/A 04/18/2020   Procedure: COLONOSCOPY WITH PROPOFOL;  Surgeon: Sherrilyn Rist, MD;  Location: [facility] ENDOSCOPY;  Service: Gastroenterology;  Laterality: N/A;   CYSTOSCOPY W/ URETERAL STENT PLACEMENT N/A 11/14/2019   Procedure: CYSTOSCOPY WITH RETROGRADE PYELOGRAM bilateral Otilio Miu STENT PLACEMENT left fulguration bladder . cystogram;  Surgeon: Heloise Purpura, MD;  Location: [facility] ORS;  Service: Urology;  Laterality: N/A;   CYSTOSCOPY W/ URETERAL STENT PLACEMENT Bilateral 12/25/2019   Procedure: CYSTOSCOPY WITH bilateral  RETROGRADE PYELOGRAM/ leftURETERAL STENT PLACEMENT;  Surgeon:  Crista Elliot, MD;  Location: [facility] ORS;  Service: Urology;  Laterality: Bilateral;   CYSTOSCOPY W/ URETERAL STENT PLACEMENT Bilateral 07/06/2020   Procedure: CYSTOSCOPY WITH BILATERAL STENT EXCHANGE; BILATERAL PYELOGRAM RETROGRADE;  Surgeon: Heloise Purpura, MD;  Location: [facility] ORS;  Service: Urology;  Laterality: Bilateral;   CYSTOSCOPY WITH URETEROSCOPY AND STENT PLACEMENT N/A 04/18/2020   Procedure: CYSTOSCOPY WITH BILATERAL URETERAL STENT CHANGE PLACEMENT;  Surgeon: Crist Fat, MD;  Location: [facility] OR;  Service: Urology;  Laterality: N/A;   EPIGASTRIC HERNIA REPAIR N/A 04/18/2020    Procedure: HERNIA REPAIR EPIGASTRIC ADULT;  Surgeon: Karie Soda, MD;  Location: [facility] OR;  Service: General;  Laterality: N/A;   GASTRIC BYPASS     HERNIA REPAIR  1970   INGUINAL HERNIA REPAIR Left    INSERTION OF DIALYSIS CATHETER Right 10/16/2020   Procedure: INSERTION OF RIGHT INTERNAL JUGULAR TUNNELED DIALYSIS CATHETER WITH ULTRASOUND GUIDANCE;  Surgeon: Cephus Shelling, MD;  Location: [facility] OR;  Service: Vascular;  Laterality: Right;   IR FLUORO GUIDE CV LINE RIGHT  04/01/2020   IR NEPHRO TUBE REMOV/FL  04/25/2020   IR NEPHRO TUBE REMOV/FL  10/23/2020   IR NEPHRO TUBE REMOV/FL  10/23/2020   IR NEPHROSTOMY PLACEMENT LEFT  12/27/2019   IR NEPHROSTOMY PLACEMENT LEFT  04/01/2020   IR NEPHROSTOMY PLACEMENT LEFT  10/08/2020   IR NEPHROSTOMY PLACEMENT RIGHT  11/16/2019   IR NEPHROSTOMY PLACEMENT RIGHT  12/27/2019   IR NEPHROSTOMY PLACEMENT RIGHT  04/01/2020   IR NEPHROSTOMY PLACEMENT RIGHT  10/08/2020   IR SINUS/FIST TUBE CHK-NON GI  12/03/2019   IR URETERAL STENT PLACEMENT EXISTING ACCESS RIGHT  12/08/2019   IR URETERAL STENT PLACEMENT EXISTING ACCESS RIGHT  01/26/2020   IR US GUIDANCE  10/08/2020   IR US GUIDANCE  10/08/2020   IR US GUIDE VASC ACCESS RIGHT  04/01/2020   LAPAROSCOPIC LOOP COLOSTOMY N/A 04/18/2020   Procedure: LAPAROSCOPIC SIGMOID COLOTOMY WITH COLOSTOMY;  Surgeon: Karie Soda, MD;  Location: [facility] OR;  Service: General;  Laterality: N/A;   TRANSURETHRAL RESECTION OF PROSTATE N/A 01/24/2022   Procedure: TRANSURETHRAL RESECTION OF THE PROSTATE (TURP);  Surgeon: Heloise Purpura, MD;  Location: [facility]S;  Service: Urology;  Laterality: N/A;    Social History   Socioeconomic History   Marital status: Widowed    Spouse name: Not on file   Number of children: Not on file   Years of education: Not on file   Highest education level: Not on file  Occupational History   Not on file  Tobacco Use   Smoking status: Former    Current packs/day: 0.00    Types: Cigarettes     Quit date: 75    Years since quitting: 30.8   Smokeless tobacco: Current    Types: Chew  Vaping Use   Vaping status: Never Used  Substance and Sexual Activity   Alcohol use: Yes    Comment: very rare   Drug use: Never   Sexual activity: Not Currently  Other Topics Concern   Not on file  Social History Narrative   Widow, Designer, 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 [place] as well   2 sons   Lives alone, his mother has been helping him in 2021 with illnesses   Former smoker no alcohol  or drug use now   Does use smokeless tobacco   2 caffeinated beverages daily   Social Determinants of Corporate investment banker Strain: Not on file  Food  Insecurity: No Food Insecurity (01/24/2022)   Hunger Vital Sign    Worried About Running Out of Food in the Last Year: Never true    Ran Out of Food in the Last Year: Never true  Transportation Needs: No Transportation Needs (01/24/2022)   PRAPARE - Administrator, Civil Service (Medical): No    Lack of Transportation (Non-Medical): No  Physical Activity: Not on file  Stress: Not on file  Social Connections: Unknown (08/10/2021)   [facility]   Social Network    Social Network: Not on file  Intimate Partner Violence: Not At Risk (01/24/2022)   Humiliation, Afraid, Rape, and Kick questionnaire    Fear of Current or Ex-Partner: No    Emotionally Abused: No    Physically Abused: No    Sexually Abused: No    Family History  Problem Relation Age of Onset   Renal cancer Father        mets to liver and lungs and pancreas   Heart disease Father    Heart disease Paternal Grandfather    Colon cancer Maternal Grandmother    Colon cancer Paternal Uncle    Stomach cancer Neg Hx    Esophageal cancer Neg Hx    Pancreatic cancer Neg Hx     Medications Prior to Admission  Medication Sig Dispense Refill Last Dose   acetaminophen (TYLENOL) 500 MG tablet Take 500-1,000 mg by mouth in the  morning, at noon, and at bedtime.   02/04/2023   amitriptyline (ELAVIL) 10 MG tablet Take 10 mg by mouth at bedtime as needed for sleep.   Past Week   Calcium Acetate 667 MG TABS Take 667 mg by mouth in the morning, at noon, and at bedtime.   02/04/2023   cinacalcet (SENSIPAR) 30 MG tablet Take 30 mg by mouth 3 (three) times a week. Take after dialysis M-w-F   02/04/2023   cyanocobalamin (,VITAMIN B-12,) 1000 MCG/ML injection Inject 1,000 mcg into the muscle every 30 (thirty) days.    Past Month   furosemide (LASIX) 80 MG tablet Take 80 mg by mouth daily.   02/04/2023   gabapentin (NEURONTIN) 100 MG capsule Take 100 mg by mouth See admin instructions. Take 100 mg once daily on Tuesday, Thursday, Saturday, and Sunday,   02/04/2023   gabapentin (NEURONTIN) 300 MG capsule Take 300 mg by mouth See admin instructions. Take 300 mg by mouth once daily on Monday, Wednesday, Friday   Past Week   hydrOXYzine (ATARAX/VISTARIL) 10 MG tablet Take 1 tablet (10 mg total) by mouth 3 (three) times daily as needed for itching or anxiety. (Patient taking differently: Take 10 mg by mouth as needed for anxiety (PTSD).) 30 tablet 0 02/05/2023 at 0545   Multiple Vitamin (MULTIVITAMIN WITH MINERALS) TABS tablet Take 1 tablet by mouth daily. 30 tablet 0 02/04/2023   Naloxone HCl (NARCAN NA) Place 4 mg into the nose as needed (overdose).      pantoprazole (PROTONIX) 20 MG tablet TAKE 1 TABLET (20 MG TOTAL) BY MOUTH 2 (TWO) TIMES DAILY. (Patient taking differently: Take 20 mg by mouth 2 (two) times daily.) 15 tablet 0 02/04/2023   prazosin (MINIPRESS) 1 MG capsule Take 1 mg by mouth at bedtime.   02/04/2023   calcium carbonate (TUMS - DOSED IN MG ELEMENTAL CALCIUM) 500 MG chewable tablet Chew 1 tablet (200 mg of elemental calcium total) by mouth 3 (three) times daily. (Patient taking differently: Chew 500-1,000 mg by mouth as  needed for indigestion or heartburn.)   Unknown   Heparin Sod, Pork, Lock Flush (MONOJECT PREFILL HEPARIN SOD IV)  Inject into the vein. Given at Dialysis Mondays, Wednesdays, and Fridays      midodrine (PROAMATINE) 10 MG tablet Take 1 tablet (10 mg total) by mouth daily. (Patient not taking: Reported on 01/30/2023) 90 tablet 0 Not Taking    Current Facility-Administered Medications  Medication Dose Route Frequency Provider Last Rate Last Admin   0.9 %  sodium chloride infusion   Intravenous Continuous Jairo Ben, MD       alvimopan (ENTEREG) capsule 12 mg  12 mg Oral On Call to OR Karie Soda, MD       bupivacaine liposome (EXPAREL) 1.3 % injection 266 mg  20 mL Infiltration Once Karie Soda, MD       cefoTEtan (CEFOTAN) 2 g in sodium chloride 0.9 % 100 mL IVPB  2 g Intravenous On Call to OR Karie Soda, MD       Chlorhexidine Gluconate Cloth 2 % PADS 6 each  6 each Topical Once Karie Soda, MD       And   Chlorhexidine Gluconate Cloth 2 % PADS 6 each  6 each Topical Once Karie Soda, MD       Melene Muller ON 02/06/2023] feeding supplement (ENSURE PRE-SURGERY) liquid 296 mL  296 mL Oral Once Karie Soda, MD       feeding supplement (ENSURE PRE-SURGERY) liquid 592 mL  592 mL Oral Once Karie Soda, MD       heparin injection 5,000 Units  5,000 Units Subcutaneous Once Karie Soda, MD         Allergies  Allergen Reactions   Nsaids Other (See Comments)    Bleeding GI Ulceration   Aspirin Nausea And Vomiting   Ciprofloxacin Itching   Ketorolac Tromethamine Other (See Comments)    Serum creatinine above reference range    BP 120/74   Pulse 74   Temp 98.7 F (37.1 C)   Resp 18   Ht 5\' 8"  (1.727 m)   Wt 73 kg   SpO2 96%   BMI 24.48 kg/m   Labs: Results for orders placed or performed during the hospital encounter of 02/05/23 (from the past 48 hour(s))  I-STAT, chem 8     Status: Abnormal   Collection Time: 02/05/23  6:59 AM  Result Value Ref Range   Sodium 138 135 - 145 mmol/L   Potassium 3.0 (L) 3.5 - 5.1 mmol/L   Chloride 99 98 - 111 mmol/L   BUN 26 (H) 6 - 20 mg/dL    Creatinine, Ser 1.61 (H) 0.61 - 1.24 mg/dL   Glucose, Bld 96 70 - 99 mg/dL    Comment: Glucose reference range applies only to samples taken after fasting for at least 8 hours.   Calcium, Ion 1.06 (L) 1.15 - 1.40 mmol/L   TCO2 25 22 - 32 mmol/L   Hemoglobin 12.6 (L) 13.0 - 17.0 g/dL   HCT 09.6 (L) 04.5 - 40.9 %    Imaging / Studies: No results found.   Ardeth Sportsman, M.D., F.A.C.S. Gastrointestinal and Minimally Invasive Surgery [facility] [address], [address] [place], [place] [address] [phone] Main / Paging  02/05/2023 7:33 AM    Ardeth Sportsman

## 2023-02-06 ENCOUNTER — Encounter (HOSPITAL_COMMUNITY): Payer: Self-pay | Admitting: Surgery

## 2023-02-06 LAB — CBC
HCT: 28.5 % — ABNORMAL LOW (ref 39.0–52.0)
HCT: 30.7 % — ABNORMAL LOW (ref 39.0–52.0)
Hemoglobin: 9.7 g/dL — ABNORMAL LOW (ref 13.0–17.0)
Hemoglobin: 10.4 g/dL — ABNORMAL LOW (ref 13.0–17.0)
MCH: 33 pg (ref 26.0–34.0)
MCH: 32.5 pg (ref 26.0–34.0)
MCHC: 34 g/dL (ref 30.0–36.0)
MCHC: 33.9 g/dL (ref 30.0–36.0)
MCV: 96.9 fL (ref 80.0–100.0)
MCV: 95.9 fL (ref 80.0–100.0)
Platelets: 172 10*3/uL (ref 150–400)
Platelets: 171 10*3/uL (ref 150–400)
RBC: 2.94 MIL/uL — ABNORMAL LOW (ref 4.22–5.81)
RBC: 3.2 MIL/uL — ABNORMAL LOW (ref 4.22–5.81)
RDW: 15.1 % (ref 11.5–15.5)
RDW: 14.9 % (ref 11.5–15.5)
WBC: 8.6 10*3/uL (ref 4.0–10.5)
WBC: 7.9 10*3/uL (ref 4.0–10.5)
nRBC: 0 % (ref 0.0–0.2)
nRBC: 0 % (ref 0.0–0.2)

## 2023-02-06 LAB — BASIC METABOLIC PANEL
Anion gap: 10 (ref 5–15)
BUN: 9 mg/dL (ref 6–20)
CO2: 28 mmol/L (ref 22–32)
Calcium: 8.1 mg/dL — ABNORMAL LOW (ref 8.9–10.3)
Chloride: 94 mmol/L — ABNORMAL LOW (ref 98–111)
Creatinine, Ser: 3.9 mg/dL — ABNORMAL HIGH (ref 0.61–1.24)
GFR, Estimated: 17 mL/min — ABNORMAL LOW (ref >60)
Glucose, Bld: 163 mg/dL — ABNORMAL HIGH (ref 70–99)
Potassium: 3.3 mmol/L — ABNORMAL LOW (ref 3.5–5.1)
Sodium: 132 mmol/L — ABNORMAL LOW (ref 135–145)

## 2023-02-06 LAB — RENAL FUNCTION PANEL
Albumin: 3.2 g/dL — ABNORMAL LOW (ref 3.5–5.0)
Anion gap: 13 (ref 5–15)
BUN: 27 mg/dL — ABNORMAL HIGH (ref 6–20)
CO2: 19 mmol/L — ABNORMAL LOW (ref 22–32)
Calcium: 8.3 mg/dL — ABNORMAL LOW (ref 8.9–10.3)
Chloride: 101 mmol/L (ref 98–111)
Creatinine, Ser: 6.75 mg/dL — ABNORMAL HIGH (ref 0.61–1.24)
GFR, Estimated: 9 mL/min — ABNORMAL LOW (ref >60)
Glucose, Bld: 97 mg/dL (ref 70–99)
Phosphorus: 3.4 mg/dL (ref 2.5–4.6)
Potassium: 3.3 mmol/L — ABNORMAL LOW (ref 3.5–5.1)
Sodium: 133 mmol/L — ABNORMAL LOW (ref 135–145)

## 2023-02-06 LAB — MAGNESIUM: Magnesium: 1.6 mg/dL — ABNORMAL LOW (ref 1.7–2.4)

## 2023-02-06 LAB — HEPATITIS B SURFACE ANTIBODY, QUANTITATIVE: Hep B S AB Quant (Post): <3.5 m[IU]/mL — ABNORMAL LOW

## 2023-02-06 MED ORDER — HEPARIN SODIUM (PORCINE) 1000 UNIT/ML IJ SOLN
INTRAMUSCULAR | Status: AC
  Filled 2023-02-06: qty 3

## 2023-02-06 MED ORDER — SODIUM CHLORIDE 0.9% FLUSH
3.0000 mL | Freq: Two times a day (BID) | INTRAVENOUS | Status: DC
  Administered 2023-02-06 – 2023-02-09 (×6): 3 mL via INTRAVENOUS

## 2023-02-06 MED ORDER — PENTAFLUOROPROP-TETRAFLUOROETH EX AERO: 1.0000 | INHALATION_SPRAY | CUTANEOUS | Status: DC | PRN

## 2023-02-06 MED ORDER — LACTATED RINGERS IV SOLN: 1000.0000 mL | Freq: Three times a day (TID) | INTRAVENOUS | Status: AC | PRN

## 2023-02-06 MED ORDER — CHLORHEXIDINE GLUCONATE CLOTH 2 % EX PADS
6.0000 | MEDICATED_PAD | Freq: Every day | CUTANEOUS | Status: DC
  Administered 2023-02-06 – 2023-02-08 (×3): 6 via TOPICAL

## 2023-02-06 MED ORDER — LIDOCAINE-PRILOCAINE 2.5-2.5 % EX CREA: 1.0000 | TOPICAL_CREAM | CUTANEOUS | Status: DC | PRN

## 2023-02-06 NOTE — Plan of Care (Signed)

## 2023-02-06 NOTE — Progress Notes (Signed)
02/06/2023  Chase [name] [id number] [birth year]  CARE TEAM: PCP: [facility]  Outpatient Care Team: Patient Care Team: [facility] as PCP - Rosezetta Schlatter, MD as Consulting Physician (Urology) Karie Soda, MD as Consulting Physician (General Surgery) Cannon Kettle, MD as Referring Physician (Pain Medicine) Kathlynn Grate, DO (Inactive) as Consulting Physician (Infectious Diseases) Micki Riley, MD as Consulting Physician (Neurology) Iva Boop, MD as Consulting Physician (Gastroenterology) Darnell Level, MD as Consulting Physician (Nephrology)  Inpatient Treatment Team: Treatment Team:  Karie Soda, MD Dagoberto Ligas, MD Sammuel Cooper, RN Lucretia Roers, RN   Problem List:   Principal Problem:   History of colonic diverticulitis Active Problems:   ESRD (end stage renal disease) on dialysis [facility])   History of gastric bypass   PTSD (post-traumatic stress disorder)   History of CVA (cerebrovascular accident)   Prostate cancer [facility])   Chronic pain syndrome   Arthralgia of multiple sites   History of stomach ulcers   Pernicious anemia   Anxiety and depression   Anemia in chronic kidney disease   Personality disorder    Chronic ischemic heart disease, unspecified   Gastroesophageal reflux disease   Hearing loss   02/05/2023  POST-OPERATIVE DIAGNOSIS:   COLOSTOMY FOR COLON RESECTION. DESIRE FOR OSTOMY TAKEDOWN HISTORY OF COLOVESICAL FISTULA & PELVIC ABSCESSES DUE TO DIVERTICULITIS S/P HARTMANN COLECTOMY/COLOSTOMY     PROCEDURE:   ROBOTIC COLOSTOMY TAKEDOWN FLEXIBLE SIGMOIDOSCOPY TRANSVERSUS ABDOMINIS PLANE (TAP) BLOCK - BILATERAL   SURGEON:  Ardeth Sportsman, MD  OR FINDINGS:  Mild adhesions in the abdomen.  Old supraumbilical mesh abutting up to the end colostomy in the left upper quadrant.  Hold preperitoneal mesh in the left lower quadrant from prior laparoscopic repair.  I believe that this done at  the [place].   Patient has an end descending colon to anterior side rectal stump 31 EEA anastomosis that rests 14 cm from the anal verge by flexible sigmoidoscopy.   02/05/2023  Preoperative diagnosis:  Colostomy, desire for ostomy takedown   Postoperative diagnosis: same   Procedure(s): Cystoscopy with bilateral ureteral catheterization for firefly injection   Surgeon: Dr. Irine Seal   Intraoperative findings: unremarkable bladder; s/p TURP  Assessment [facility] Stay = 1 days) 1 Day Post-Op    Recovering relatively well so far    Plan:  ERAS protocol  Tolerating clears.  Advance to dysphagia 1 and possibly more  Remove dressings and wicks postop day tomorrow  Remove Foley.  Even though he is end-stage renal disease he is not oliguric.    End-stage renal disease with nephrology consultation for hemodialysis.  Hemodialysis.  Most likely doing today to catch up.  PPI for GERD and history of ulcers with gastric bypass  Chronic anxiety.  Trying to do Atarax and avoid benzodiazepines   Chronic pain.  Resumed amitriptyline and gabapentin.    Acute postop pain.  Scheduled Tylenol with ice and heat.  Narcotics and muscle relaxers for breakthrough.  -VTE prophylaxis- SCDs, etc  -mobilize as tolerated to help recovery  -Disposition:  Disposition:  The patient is from: Home Anticipate discharge to:  Home Anticipated Date of Discharge is:  November 7,2024   Barriers to discharge:  Consultant clearance & sign off  , Therapy assessment & Recommendations pending, Need for inpatient procedure/study, and Pending Clinical improvement (more likely than not)  Patient currently is NOT MEDICALLY STABLE for discharge from the hospital from a surgery standpoint.      I  reviewed last 24 h vitals and pain scores, last 48 h intake and output, last 24 h labs and trends, and last 24 h imaging results.  I have reviewed this patient's available data, including medical history, events of  note, test results, etc as part of my evaluation.   A significant portion of that time was spent in counseling. Care during the described time interval was provided by me.  This care required moderate level of medical decision making.  02/06/2023    Subjective: (Chief complaint)  Smiling in good spirits.  Very chatty today.  Denies much pain.  Tolerating liquids.  No nausea.  Objective:  Vital signs:  Vitals:   02/05/23 1927 02/06/23 0004 02/06/23 0500 02/06/23 0554  BP: 137/76 136/69  (!) 146/73  Pulse: 96 96  73  Resp: 17 17  18   Temp: 98 F (36.7 C) 98.3 F (36.8 C)  97.7 F (36.5 C)  TempSrc: Oral Oral  Oral  SpO2: 97% 94%  97%  Weight:   74.6 kg   Height:        Last BM Date : 02/04/23  Intake/Output   Yesterday:  11/06 0701 - 11/07 0700 In: 1122.8 [P.O.:240; I.V.:782.8; IV Piggyback:100] Out: 500 [Urine:450; Blood:50] This shift:  No intake/output data recorded.  Bowel function:  Flatus: YES  BM:  No  Drain: (No drain)   Physical Exam:  General: Pt awake/alert in no acute distress.  Nothing relaxed.  Very chatty asking a lot of questions.  No agitation or delirium. Eyes: PERRL, normal EOM.  Sclera clear.  No icterus Neuro: CN II-XII intact w/o focal sensory/motor deficits. Lymph: No head/neck/groin lymphadenopathy Psych:  No delerium/psychosis/paranoia.  Oriented x 4 HENT: Normocephalic, Mucus membranes moist.  No thrush Neck: Supple, No tracheal deviation.  No obvious thyromegaly Chest: No pain to chest wall compression.  Good respiratory excursion.  No audible wheezing CV:  Pulses intact.  Regular rhythm.  No major extremity edema MS: Normal AROM mjr joints.  No obvious deformity  Abdomen: Soft.  Nondistended.  Mildly tender at incisions only.  No evidence of peritonitis.  No incarcerated hernias.  Ext:   No deformity.  No mjr edema.  No cyanosis.  Left arm AV fistula with palpable thrill. Skin: No petechiae / purpurea.  No major sores.   Warm and dry    Results:   Cultures: No results found for this or any previous visit (from the past 720 hour(s)).  Labs: Results for orders placed or performed during the hospital encounter of 02/05/23 (from the past 48 hour(s))  I-STAT, chem 8     Status: Abnormal   Collection Time: 02/05/23  6:59 AM  Result Value Ref Range   Sodium 138 135 - 145 mmol/L   Potassium 3.0 (L) 3.5 - 5.1 mmol/L   Chloride 99 98 - 111 mmol/L   BUN 26 (H) 6 - 20 mg/dL   Creatinine, Ser 5.62 (H) 0.61 - 1.24 mg/dL   Glucose, Bld 96 70 - 99 mg/dL    Comment: Glucose reference range applies only to samples taken after fasting for at least 8 hours.   Calcium, Ion 1.06 (L) 1.15 - 1.40 mmol/L   TCO2 25 22 - 32 mmol/L   Hemoglobin 12.6 (L) 13.0 - 17.0 g/dL   HCT 13.0 (L) 86.5 - 78.4 %  Hepatitis B surface antigen     Status: None   Collection Time: 02/05/23  2:54 PM  Result Value Ref Range   Hepatitis B Surface  Ag NON REACTIVE NON REACTIVE    Comment: Performed at [facility], [address] [place], [place] [address]  Hepatitis B surface antibody,quantitative     Status: Abnormal   Collection Time: 02/05/23  2:54 PM  Result Value Ref Range   Hep B S AB Quant (Post) <3.5 (L) Immunity>10 mIU/mL    Comment: (NOTE)  Status of Immunity                     Anti-HBs Level  ------------------                     -------------- Inconsistent with Immunity                  0.0 - 10.0 Consistent with Immunity                         >10.0 Performed At: [facility] [address] [place], [place] [id number] Jolene Schimke MD QI:[id number]     Imaging / Studies: No results found.  Medications / Allergies: per chart  Antibiotics: Anti-infectives (From admission, onward)    Start     Dose/Rate Route Frequency Ordered Stop   02/05/23 2100  cefoTEtan (CEFOTAN) 2 g in sodium chloride 0.9 % 100 mL IVPB        2 g 200 mL/hr over 30 Minutes Intravenous Every 12 hours 02/05/23 1334 02/05/23 2102    02/05/23 1400  neomycin (MYCIFRADIN) tablet 1,000 mg  Status:  Discontinued       Placed in "And" Linked Group   1,000 mg Oral 3 times per day 02/05/23 0633 02/05/23 0635   02/05/23 1400  metroNIDAZOLE (FLAGYL) tablet 1,000 mg  Status:  Discontinued       Placed in "And" Linked Group   1,000 mg Oral 3 times per day 02/05/23 0633 02/05/23 0635   02/05/23 0645  cefoTEtan (CEFOTAN) 2 g in sodium chloride 0.9 % 100 mL IVPB        2 g 200 mL/hr over 30 Minutes Intravenous On call to O.R. 02/05/23 [id number] 02/05/23 0919         Note: Portions of this report may have been transcribed using voice recognition software. Every effort was made to ensure accuracy; however, inadvertent computerized transcription errors may be present.   Any transcriptional errors that result from this process are unintentional.    Ardeth Sportsman, MD, FACS, MASCRS Esophageal, Gastrointestinal & Colorectal Surgery Robotic and Minimally Invasive Surgery  [facility] [address], [address] [place], [place] [address] [phone] Fax [phone] Main  CONTACT INFORMATION: Weekday (9AM-5PM): Call [facility] main office at [phone] Weeknight (5PM-9AM) or Weekend/Holiday: Check EPIC "Web Links" tab & use "AMION" (password " [email or web address]") for General Surgery [facility] coverage  Please, DO NOT use SecureChat  (it is not reliable communication to reach operating surgeons & will lead to a delay in care).   Epic staff messaging available for outptient concerns needing 1-2 business day response.      02/06/2023  7:21 AM

## 2023-02-06 NOTE — TOC CM/SW Note (Signed)
[facility] . [facility]   [facility] [place] [facility] [place] [facility] [phone] ext [phone]

## 2023-02-06 NOTE — Plan of Care (Signed)
  Problem: Pain Management: Goal: General experience of comfort will improve Outcome: Progressing   Problem: Safety: Goal: Ability to remain free from injury will improve Outcome: Progressing

## 2023-02-06 NOTE — Progress Notes (Signed)
Received patient in bed.Awake.alert and oriented x 4.Consent verified.  Access used: Left upper AVF that worked well..  UF goal 1.25 L  Duration of treatment: 3.5 hours  Tolerated treatment: yes.  Hand off to the patient's nurse ,back into his room with stable medical condition via transporter.

## 2023-02-06 NOTE — Progress Notes (Signed)
Contacted [facility] [place] [facility] CSW to inquire/confirm pt receives out-pt HD at [place]. Will await a return call.   Olivia Canter Renal Navigator [phone]

## 2023-02-06 NOTE — Anesthesia Postprocedure Evaluation (Signed)
Anesthesia Post Note  Patient: Chase [name]  Procedure(s) Performed: XI ROBOTIC ASSISTED OSTOMY TAKEDOWN LYSIS OF ADHESION RIGDI PROCTOSCOPY CYSTOSCOPY with FIREFLY INJECTION     Patient location during evaluation: PACU Anesthesia Type: General Level of consciousness: awake and alert Pain management: pain level controlled Vital Signs Assessment: post-procedure vital signs reviewed and stable Respiratory status: spontaneous breathing, nonlabored ventilation, respiratory function stable and patient connected to nasal cannula oxygen Cardiovascular status: blood pressure returned to baseline and stable Postop Assessment: no apparent nausea or vomiting Anesthetic complications: no   No notable events documented.  Last Vitals:  Vitals:   02/06/23 0004 02/06/23 0554  BP: 136/69 (!) 146/73  Pulse: 96 73  Resp: 17 18  Temp: 36.8 C 36.5 C  SpO2: 94% 97%    Last Pain:  Vitals:   02/06/23 0629  TempSrc:   PainSc: 4                  Brenetta Penny S

## 2023-02-06 NOTE — Plan of Care (Signed)
  Problem: Education: Goal: Understanding of discharge needs will improve Outcome: Progressing Goal: Verbalization of understanding of the causes of altered bowel function will improve Outcome: Progressing   Problem: Activity: Goal: Ability to tolerate increased activity will improve Outcome: Progressing   Problem: Bowel/Gastric: Goal: Gastrointestinal status for postoperative course will improve Outcome: Progressing   Problem: Health Behavior/Discharge Planning: Goal: Identification of community resources to assist with postoperative recovery needs will improve Outcome: Progressing   Problem: Clinical Measurements: Goal: Postoperative complications will be avoided or minimized Outcome: Progressing   Problem: Skin Integrity: Goal: Will show signs of wound healing Outcome: Progressing   Problem: Education: Goal: Knowledge of General Education information will improve Description: Including pain rating scale, medication(s)/side effects and non-pharmacologic comfort measures Outcome: Progressing   Problem: Health Behavior/Discharge Planning: Goal: Ability to manage health-related needs will improve Outcome: Progressing   Problem: Clinical Measurements: Goal: Will remain free from infection Outcome: Progressing   Problem: Activity: Goal: Risk for activity intolerance will decrease Outcome: Progressing   Problem: Nutrition: Goal: Adequate nutrition will be maintained Outcome: Progressing   Problem: Elimination: Goal: Will not experience complications related to bowel motility Outcome: Progressing   Problem: Pain Management: Goal: General experience of comfort will improve Outcome: Progressing   Problem: Skin Integrity: Goal: Risk for impaired skin integrity will decrease Outcome: Progressing

## 2023-02-06 NOTE — Evaluation (Signed)
Physical Therapy Evaluation Patient Details Name: Chase [name] MRN: [id number] DOB: 09-28-1965 Today's Date: 02/06/2023  History of Present Illness  57 y/o male presents to [facility] 02/05/23 for colostomy, desire for ostomy takedown. S/p cystoscopy w/ B uretal catheterization, robotic colostomy takedown, sigmoidoscopy on 11/6. PMH: non-Hodgkin's lymphoma, history of CVA, non-ST elevation MI, posttraumatic stress disorder and end-stage renal disease on hemodialysis, chronic pain syndrome with chronic back pain, CKD 4, CAD with history of MI,  PTSD   Clinical Impression  Pt in bed upon arrival and agreeable to PT eval. Prior to admission, pt was ambulating with no AD or SP cane. Pt was able to stand and walk a short distance in the room with supervision and no AD. Pt was limited in distance due to pain and feeling drowsy. Pt presents to therapy session with decreased activity tolerance and mobility. Pt would benefit from acute skilled PT to progress activity tolerance. Anticipating pt will need no follow up PT services. Acute PT to follow.          If plan is discharge home, recommend the following: A little help with walking and/or transfers   Can travel by private vehicle    Yes    Equipment Recommendations None recommended by PT     Functional Status Assessment Patient has had a recent decline in their functional status and demonstrates the ability to make significant improvements in function in a reasonable and predictable amount of time.     Precautions / Restrictions Precautions Precautions: Other (comment) (abdominal) Restrictions Weight Bearing Restrictions: No      Mobility  Bed Mobility Overal bed mobility: Modified Independent    General bed mobility comments: HOB elevated    Transfers Overall transfer level: Needs assistance Equipment used: None Transfers: Sit to/from Stand Sit to Stand: Supervision    General transfer comment: supervision for safety     Ambulation/Gait Ambulation/Gait assistance: Supervision Gait Distance (Feet): 6 Feet Assistive device: None Gait Pattern/deviations: Step-through pattern Gait velocity: dec     General Gait Details: steady gait w/ no AD        Balance Overall balance assessment: No apparent balance deficits (not formally assessed)       Pertinent Vitals/Pain Pain Assessment Pain Assessment: Faces Faces Pain Scale: Hurts little more Pain Location: abdomen Pain Descriptors / Indicators: Aching, Discomfort Pain Intervention(s): Limited activity within patient's tolerance, Monitored during session, Repositioned    Home Living Family/patient expects to be discharged to:: Private residence Living Arrangements: Alone Available Help at Discharge: Family;Friend(s);Available PRN/intermittently Type of Home: House Home Access: Ramped entrance       Home Layout: One level Home Equipment: Wheelchair - power;Electric scooter;Cane - single point;Rollator (4 wheels);BSC/3in1      Prior Function Prior Level of Function : Independent/Modified Independent  Mobility Comments: uses SP cane or no AD for short distances ADLs Comments: did sponge baths due to colostomy bag     Extremity/Trunk Assessment   Upper Extremity Assessment Upper Extremity Assessment: Overall WFL for tasks assessed    Lower Extremity Assessment Lower Extremity Assessment: Overall WFL for tasks assessed    Cervical / Trunk Assessment Cervical / Trunk Assessment: Normal  Communication   Communication Communication: No apparent difficulties Cueing Techniques: Verbal cues  Cognition Arousal: Alert Behavior During Therapy: WFL for tasks assessed/performed Overall Cognitive Status: Within Functional Limits for tasks assessed       General Comments General comments (skin integrity, edema, etc.): VSS on RA     PT Assessment Patient  needs continued PT services  PT Problem List Decreased activity tolerance;Decreased  balance;Decreased mobility       PT Treatment Interventions DME instruction;Gait training;Functional mobility training;Therapeutic activities;Therapeutic exercise;Balance training;Patient/family education    PT Goals (Current goals can be found in the Care Plan section)  Acute Rehab PT Goals Patient Stated Goal: to get better PT Goal Formulation: With patient Time For Goal Achievement: 02/20/23 Potential to Achieve Goals: Good    Frequency Min 1X/week        AM-PAC PT "6 Clicks" Mobility  Outcome Measure Help needed turning from your back to your side while in a flat bed without using bedrails?: None Help needed moving from lying on your back to sitting on the side of a flat bed without using bedrails?: None Help needed moving to and from a bed to a chair (including a wheelchair)?: A Little Help needed standing up from a chair using your arms (e.g., wheelchair or bedside chair)?: A Little Help needed to walk in hospital room?: A Little Help needed climbing 3-5 steps with a railing? : A Little 6 Click Score: 20    End of Session Equipment Utilized During Treatment: Gait belt Activity Tolerance: Patient tolerated treatment well Patient left: in chair;with call bell/phone within reach;with chair alarm set Nurse Communication: Mobility status PT Visit Diagnosis: Unsteadiness on feet (R26.81)    Time: 1657-1730 PT Time Calculation (min) (ACUTE ONLY): 33 min   Charges:   PT Evaluation $PT Eval Moderate Complexity: 1 Mod   PT General Charges $$ ACUTE PT VISIT: 1 Visit         Hilton Cork, PT, DPT Secure Chat [facility] [phone]   Arturo Morton Brion [facility] 02/06/2023, 5:36 PM

## 2023-02-06 NOTE — Procedures (Signed)
Patient seen on Hemodialysis. BP (!) 155/75   Pulse 73   Temp (!) 96.3 F (35.7 C) (Oral)   Resp 18   Ht 5\' 8"  (1.727 m)   Wt 74.6 kg   SpO2 97%   BMI 25.01 kg/m   QB 400, UF goal 1.5L Tolerating treatment without complaints at this time.   Zetta Bills MD [facility] Office # [phone] Pager # [phone] 9:37 AM

## 2023-02-07 DIAGNOSIS — E876 Hypokalemia: Secondary | ICD-10-CM | POA: Insufficient documentation

## 2023-02-07 LAB — CBC
HCT: 30.2 % — ABNORMAL LOW (ref 39.0–52.0)
Hemoglobin: 9.9 g/dL — ABNORMAL LOW (ref 13.0–17.0)
MCH: 31.5 pg (ref 26.0–34.0)
MCHC: 32.8 g/dL (ref 30.0–36.0)
MCV: 96.2 fL (ref 80.0–100.0)
Platelets: 147 10*3/uL — ABNORMAL LOW (ref 150–400)
RBC: 3.14 MIL/uL — ABNORMAL LOW (ref 4.22–5.81)
RDW: 15.3 % (ref 11.5–15.5)
WBC: 6.2 10*3/uL (ref 4.0–10.5)
nRBC: 0 % (ref 0.0–0.2)

## 2023-02-07 LAB — POTASSIUM: Potassium: 3.5 mmol/L (ref 3.5–5.1)

## 2023-02-07 LAB — CREATININE, SERUM
Creatinine, Ser: 5.09 mg/dL — ABNORMAL HIGH (ref 0.61–1.24)
GFR, Estimated: 12 mL/min — ABNORMAL LOW (ref >60)

## 2023-02-07 LAB — SURGICAL PATHOLOGY

## 2023-02-07 MED ORDER — CHLORHEXIDINE GLUCONATE CLOTH 2 % EX PADS
6.0000 | MEDICATED_PAD | Freq: Every day | CUTANEOUS | Status: DC
  Administered 2023-02-08 – 2023-02-09 (×2): 6 via TOPICAL

## 2023-02-07 MED ORDER — MAGNESIUM SULFATE 4 GM/100ML IV SOLN
4.0000 g | Freq: Once | INTRAVENOUS | Status: AC
  Administered 2023-02-07: 4 g via INTRAVENOUS
  Filled 2023-02-07: qty 100

## 2023-02-07 MED ORDER — POTASSIUM CHLORIDE CRYS ER 20 MEQ PO TBCR
40.0000 meq | EXTENDED_RELEASE_TABLET | Freq: Once | ORAL | Status: AC
  Administered 2023-02-07: 40 meq via ORAL
  Filled 2023-02-07: qty 2

## 2023-02-07 MED ORDER — CALCIUM CARBONATE ANTACID 500 MG PO CHEW: 500.0000 mg | CHEWABLE_TABLET | Freq: Three times a day (TID) | ORAL | Status: DC | PRN

## 2023-02-07 NOTE — Plan of Care (Signed)

## 2023-02-07 NOTE — Progress Notes (Signed)
02/07/2023  Chase [name] [id number] 1965/05/04  CARE TEAM: PCP: [facility]  Outpatient Care Team: Patient Care Team: [facility] as PCP - Rosezetta Schlatter, MD as Consulting Physician (Urology) Karie Soda, MD as Consulting Physician (General Surgery) Cannon Kettle, MD as Referring Physician (Pain Medicine) Kathlynn Grate, DO (Inactive) as Consulting Physician (Infectious Diseases) Micki Riley, MD as Consulting Physician (Neurology) Iva Boop, MD as Consulting Physician (Gastroenterology) Darnell Level, MD as Consulting Physician (Nephrology)  Inpatient Treatment Team: Treatment Team:  Karie Soda, MD Dagoberto Ligas, MD [place], LPN Lucretia Roers, RN Tracey Harries, PT Doristine Counter, [facility] Kelli Hope, RN   Problem List:   Principal Problem:   History of colonic diverticulitis Active Problems:   ESRD (end stage renal disease) on dialysis [facility])   History of gastric bypass   PTSD (post-traumatic stress disorder)   History of CVA (cerebrovascular accident)   Prostate cancer [facility])   Chronic pain syndrome   Arthralgia of multiple sites   History of stomach ulcers   Pernicious anemia   Anxiety and depression   Anemia in chronic kidney disease   Personality disorder    Chronic ischemic heart disease, unspecified   Gastroesophageal reflux disease   Hearing loss   02/05/2023  POST-OPERATIVE DIAGNOSIS:   COLOSTOMY FOR COLON RESECTION. DESIRE FOR OSTOMY TAKEDOWN HISTORY OF COLOVESICAL FISTULA & PELVIC ABSCESSES DUE TO DIVERTICULITIS S/P HARTMANN COLECTOMY/COLOSTOMY     PROCEDURE:   ROBOTIC COLOSTOMY TAKEDOWN FLEXIBLE SIGMOIDOSCOPY TRANSVERSUS ABDOMINIS PLANE (TAP) BLOCK - BILATERAL   SURGEON:  Ardeth Sportsman, MD  OR FINDINGS:  Mild adhesions in the abdomen.  Old supraumbilical mesh abutting up to the end colostomy in the left upper quadrant.  Hold preperitoneal mesh in the left lower quadrant  from prior laparoscopic repair.  I believe that this done at the [place].   Patient has an end descending colon to anterior side rectal stump 31 EEA anastomosis that rests 14 cm from the anal verge by flexible sigmoidoscopy.   02/05/2023  Preoperative diagnosis:  Colostomy, desire for ostomy takedown   Postoperative diagnosis: same   Procedure(s): Cystoscopy with bilateral ureteral catheterization for firefly injection   Surgeon: Dr. Irine Seal   Intraoperative findings: unremarkable bladder; s/p TURP  Assessment [facility] Stay = 2 days) 2 Days Post-Op    Recovering relatively well so far    Plan:  ERAS protocol  Solid diet  Fiber bowel regimen.  Removed dressings and wicks  Foley removed 9:00 last night.  Patient nervous because he has not had a good void since then.  Recalls getting a bladder scan that was underwhelming.  Floor nurse notes he has not had a void but did have 500 mL since dialysis before it was removed.  She noted 1 or 2 clots.  He is not having any bleeding.  I think he is nervous because he has had a Foley for 2 years a couple years ago.  He had been voiding fine.  Will follow.  I recommended I&O cath per protocol and not do bladder ultrasounds.  If he needs a second I&O cath, leave a Foley and have him follow-up with Dr. Laverle Patter with urology to regroup.      End-stage renal disease with nephrology consultation for hemodialysis.  Hemodialysis 11/7.  See if they wish to continue today or he can wait back until Monday.  Hypokalemia and hypomagnesemia.  I do not think he got replaced with  dialysis.  I will do gentle replacement since he is nonoliguric  PPI for GERD and history of ulcers with gastric bypass  Chronic anxiety.  Trying to do Atarax and avoid benzodiazepines   Chronic pain.  Resumed amitriptyline and gabapentin.    Acute postop pain.  Scheduled Tylenol with ice and heat.  Narcotics and muscle relaxers for breakthrough.  -VTE prophylaxis- SCDs,  etc  -mobilize as tolerated to help recovery  -Disposition: I think he is probably safe to go home later today.  Will see if nephrology feels he needs more dialysis or not.  Low threshold to keep 1 more day if needed.  Disposition:  The patient is from: Home Anticipate discharge to:  Home Anticipated Date of Discharge is:  November 8,2024   Barriers to discharge:  Consultant clearance & sign off  , Therapy assessment & Recommendations pending, Need for inpatient procedure/study, and Pending Clinical improvement (more likely than not)  Patient currently is NOT MEDICALLY STABLE for discharge from the hospital from a surgery standpoint.      I reviewed last 24 h vitals and pain scores, last 48 h intake and output, last 24 h labs and trends, and last 24 h imaging results.  I have reviewed this patient's available data, including medical history, events of note, test results, etc as part of my evaluation.   A significant portion of that time was spent in counseling. Care during the described time interval was provided by me.  This care required moderate level of medical decision making.  02/07/2023    Subjective: (Chief complaint)  Patient got through hemodialysis without difficulty.  Foley catheter not removed until last night.  Patient nervous about urine dribbling like when he had a Foley in and out for 2 years.  Tolerating soft food without any nausea or vomiting.  No bowel movement yet    Objective:  Vital signs:  Vitals:   02/06/23 1330 02/06/23 1347 02/06/23 2149 02/07/23 0543  BP:  138/61 124/65 128/69  Pulse: 71 71 86 85  Resp: 17 20 17 18   Temp:  97.7 F (36.5 C) 99 F (37.2 C) 98.9 F (37.2 C)  TempSrc:   Oral Oral  SpO2: 98% 97% 96% 93%  Weight:  70.3 kg    Height:        Last BM Date : 02/04/23  Intake/Output   Yesterday:  11/07 0701 - 11/08 0700 In: -  Out: 601.5 [Urine:600] This shift:  No intake/output data recorded.  Bowel  function:  Flatus: YES  BM:  No  Drain: (No drain)   Physical Exam:  General: Pt awake/alert in no acute distress.  Smiling in bed reading.  Calm and relaxed.  Chatty. Eyes: PERRL, normal EOM.  Sclera clear.  No icterus Neuro: CN II-XII intact w/o focal sensory/motor deficits. Lymph: No head/neck/groin lymphadenopathy Psych:  No delerium/psychosis/paranoia.  Oriented x 4 HENT: Normocephalic, Mucus membranes moist.  No thrush Neck: Supple, No tracheal deviation.  No obvious thyromegaly Chest: No pain to chest wall compression.  Good respiratory excursion.  No audible wheezing CV:  Pulses intact.  Regular rhythm.  No major extremity edema MS: Normal AROM mjr joints.  No obvious deformity  Abdomen: Soft.  Nondistended.  Mildly tender at incisions only.  I removed the dressings and wick out of old colostomy site.  Areas clear.  no evidence of peritonitis.  No incarcerated hernias.  Ext:   No deformity.  No mjr edema.  No cyanosis.  Left arm AV  fistula with palpable thrill. Skin: No petechiae / purpurea.  No major sores.  Warm and dry    Results:   Cultures: No results found for this or any previous visit (from the past 720 hour(s)).  Labs: Results for orders placed or performed during the hospital encounter of 02/05/23 (from the past 48 hour(s))  Hepatitis B surface antigen     Status: None   Collection Time: 02/05/23  2:54 PM  Result Value Ref Range   Hepatitis B Surface Ag NON REACTIVE NON REACTIVE    Comment: Performed at [facility], [address], [place], [place] [address]  Hepatitis B surface antibody,quantitative     Status: Abnormal   Collection Time: 02/05/23  2:54 PM  Result Value Ref Range   Hep B S AB Quant (Post) <3.5 (L) Immunity>10 mIU/mL    Comment: (NOTE)  Status of Immunity                     Anti-HBs Level  ------------------                     -------------- Inconsistent with Immunity                  0.0 - 10.0 Consistent with Immunity                          >10.0 Performed At: [facility] [address] [place], [place] [id number] Jolene Schimke MD [id number]   Renal function panel     Status: Abnormal   Collection Time: 02/06/23 10:55 AM  Result Value Ref Range   Sodium 133 (L) 135 - 145 mmol/L   Potassium 3.3 (L) 3.5 - 5.1 mmol/L   Chloride 101 98 - 111 mmol/L   CO2 19 (L) 22 - 32 mmol/L   Glucose, Bld 97 70 - 99 mg/dL    Comment: Glucose reference range applies only to samples taken after fasting for at least 8 hours.   BUN 27 (H) 6 - 20 mg/dL   Creatinine, Ser 3.08 (H) 0.61 - 1.24 mg/dL   Calcium 8.3 (L) 8.9 - 10.3 mg/dL   Phosphorus 3.4 2.5 - 4.6 mg/dL   Albumin 3.2 (L) 3.5 - 5.0 g/dL   GFR, Estimated 9 (L) >60 mL/min    Comment: (NOTE) Calculated using the CKD-EPI Creatinine Equation (2021)    Anion gap 13 5 - 15    Comment: Performed at [facility], [address], [place], [place] [address]  CBC     Status: Abnormal   Collection Time: 02/06/23 10:55 AM  Result Value Ref Range   WBC 8.6 4.0 - 10.5 K/uL   RBC 2.94 (L) 4.22 - 5.81 MIL/uL   Hemoglobin 9.7 (L) 13.0 - 17.0 g/dL   HCT 69.6 (L) 29.5 - 28.4 %   MCV 96.9 80.0 - 100.0 fL   MCH 33.0 26.0 - 34.0 pg   MCHC 34.0 30.0 - 36.0 g/dL   RDW 13.2 44.0 - 10.2 %   Platelets 172 150 - 400 K/uL   nRBC 0.0 0.0 - 0.2 %    Comment: Performed at [facility], [address] [place], [place] [address]  Basic metabolic panel     Status: Abnormal   Collection Time: 02/06/23  4:06 PM  Result Value Ref Range   Sodium 132 (L) 135 - 145 mmol/L   Potassium 3.3 (L) 3.5 -  5.1 mmol/L   Chloride 94 (L) 98 - 111 mmol/L   CO2 28 22 - 32 mmol/L   Glucose, Bld 163 (H) 70 - 99 mg/dL    Comment: Glucose reference range applies only to samples taken after fasting for at least 8 hours.   BUN 9 6 - 20 mg/dL   Creatinine, Ser 0.86 (H) 0.61 - 1.24 mg/dL   Calcium 8.1 (L) 8.9 - 10.3 mg/dL   GFR, Estimated 17 (L) >60 mL/min    Comment: (NOTE) Calculated  using the CKD-EPI Creatinine Equation (2021)    Anion gap 10 5 - 15    Comment: Performed at [facility], [address] [place], [place] [address]  CBC     Status: Abnormal   Collection Time: 02/06/23  4:06 PM  Result Value Ref Range   WBC 7.9 4.0 - 10.5 K/uL   RBC 3.20 (L) 4.22 - 5.81 MIL/uL   Hemoglobin 10.4 (L) 13.0 - 17.0 g/dL   HCT 96.2 (L) 95.2 - 84.1 %   MCV 95.9 80.0 - 100.0 fL   MCH 32.5 26.0 - 34.0 pg   MCHC 33.9 30.0 - 36.0 g/dL   RDW 32.4 40.1 - 02.7 %   Platelets 171 150 - 400 K/uL   nRBC 0.0 0.0 - 0.2 %    Comment: Performed at [facility], [address], [place], [place] [address]  Magnesium     Status: Abnormal   Collection Time: 02/06/23  4:06 PM  Result Value Ref Range   Magnesium 1.6 (L) 1.7 - 2.4 mg/dL    Comment: Performed at [facility], [address], [place], [place] [address]  CBC     Status: Abnormal   Collection Time: 02/07/23  6:22 AM  Result Value Ref Range   WBC 6.2 4.0 - 10.5 K/uL   RBC 3.14 (L) 4.22 - 5.81 MIL/uL   Hemoglobin 9.9 (L) 13.0 - 17.0 g/dL   HCT 74.2 (L) 59.5 - 63.8 %   MCV 96.2 80.0 - 100.0 fL   MCH 31.5 26.0 - 34.0 pg   MCHC 32.8 30.0 - 36.0 g/dL   RDW 75.6 43.3 - 29.5 %   Platelets 147 (L) 150 - 400 K/uL   nRBC 0.0 0.0 - 0.2 %    Comment: Performed at [facility], [address], [place], [place] [address]    Imaging / Studies: No results found.  Medications / Allergies: per chart  Antibiotics: Anti-infectives (From admission, onward)    Start     Dose/Rate Route Frequency Ordered Stop   02/05/23 2100  cefoTEtan (CEFOTAN) 2 g in sodium chloride 0.9 % 100 mL IVPB        2 g 200 mL/hr over 30 Minutes Intravenous Every 12 hours 02/05/23 1334 02/05/23 2102   02/05/23 1400  neomycin (MYCIFRADIN) tablet 1,000 mg  Status:  Discontinued       Placed in "And" Linked Group   1,000 mg Oral 3 times per day 02/05/23 0633 02/05/23 0635   02/05/23 1400  metroNIDAZOLE (FLAGYL) tablet 1,000 mg  Status:   Discontinued       Placed in "And" Linked Group   1,000 mg Oral 3 times per day 02/05/23 0633 02/05/23 0635   02/05/23 0645  cefoTEtan (CEFOTAN) 2 g in sodium chloride 0.9 % 100 mL IVPB        2 g 200 mL/hr over 30 Minutes Intravenous On call to O.R. 02/05/23 6606  02/05/23 0919         Note: Portions of this report may have been transcribed using voice recognition software. Every effort was made to ensure accuracy; however, inadvertent computerized transcription errors may be present.   Any transcriptional errors that result from this process are unintentional.    Ardeth Sportsman, MD, FACS, MASCRS Esophageal, Gastrointestinal & Colorectal Surgery Robotic and Minimally Invasive Surgery  [facility] [address], [address] [place], [place] [address] [phone] Fax [phone] Main  CONTACT INFORMATION: Weekday (9AM-5PM): Call [facility] main office at [phone] Weeknight (5PM-9AM) or Weekend/Holiday: Check EPIC "Web Links" tab & use "AMION" (password " [email or web address]") for General Surgery [facility] coverage  Please, DO NOT use SecureChat  (it is not reliable communication to reach operating surgeons & will lead to a delay in care).   Epic staff messaging available for outptient concerns needing 1-2 business day response.      02/07/2023  7:15 AM

## 2023-02-07 NOTE — Progress Notes (Signed)
Physical Therapy Treatment Patient Details Name: Chase [name] MRN: [id number] DOB: 29-Jun-1965 Today's Date: 02/07/2023   History of Present Illness 57 y/o male presents to [facility] 02/05/23 for colostomy, desire for ostomy takedown. S/p cystoscopy w/ B uretal catheterization, robotic colostomy takedown, sigmoidoscopy on 11/6. PMH: non-Hodgkin's lymphoma, history of CVA, non-ST elevation MI, posttraumatic stress disorder and end-stage renal disease on hemodialysis, chronic pain syndrome with chronic back pain, CKD 4, CAD with history of MI,  PTSD    PT Comments  Pt in bed upon arrival and agreeable to PT session. Worked on balance, gait training, and LE strength in today's session. Pt was able to ambulate ~180 feet w/ no AD and CGA. Pt was occasionally unsteady, however, pt was able to recover with a side step. Pt is progressing well towards goals. Acute PT to follow.      If plan is discharge home, recommend the following: A little help with walking and/or transfers   Can travel by private vehicle      Yes  Equipment Recommendations  None recommended by PT       Precautions / Restrictions Precautions Precautions: Other (comment) (abdominal) Restrictions Weight Bearing Restrictions: No     Mobility  Bed Mobility Overal bed mobility: Modified Independent    General bed mobility comments: HOB elevated    Transfers Overall transfer level: Needs assistance Equipment used: None Transfers: Sit to/from Stand Sit to Stand: Supervision    General transfer comment: supervision for safety    Ambulation/Gait Ambulation/Gait assistance: Contact guard assist Gait Distance (Feet): 180 Feet Assistive device: None Gait Pattern/deviations: Step-through pattern, Decreased step length - right Gait velocity: dec     General Gait Details: increased medial/lateral sway, pt occasionally unsteady and recovers with side steps. CGA for safety.         Balance Overall balance assessment:  Mild deficits observed, not formally tested      Standardized Balance Assessment Standardized Balance Assessment : Dynamic Gait Index   Dynamic Gait Index Level Surface: Mild Impairment Change in Gait Speed: Normal Gait with Horizontal Head Turns: Mild Impairment Gait with Vertical Head Turns: Mild Impairment Gait and Pivot Turn: Normal Step Over Obstacle: Mild Impairment Step Around Obstacles: Mild Impairment Steps: Mild Impairment Total Score: 18      Cognition Arousal: Alert Behavior During Therapy: WFL for tasks assessed/performed Overall Cognitive Status: Within Functional Limits for tasks assessed         Exercises General Exercises - Lower Extremity Long Arc Quad: AROM, Both, 15 reps, Seated (w/ 5 sec isometric quad contraction) Hip Flexion/Marching: AROM, Both, 10 reps, Seated    General Comments General comments (skin integrity, edema, etc.): VSS on RA      Pertinent Vitals/Pain Pain Assessment Pain Assessment: Faces Faces Pain Scale: Hurts little more Pain Location: abdomen Pain Descriptors / Indicators: Aching, Discomfort Pain Intervention(s): Limited activity within patient's tolerance, Monitored during session, Repositioned     PT Goals (current goals can now be found in the care plan section) Acute Rehab PT Goals Patient Stated Goal: to get better PT Goal Formulation: With patient Time For Goal Achievement: 02/20/23 Potential to Achieve Goals: Good Progress towards PT goals: Progressing toward goals    Frequency    Min 1X/week       AM-PAC PT "6 Clicks" Mobility   Outcome Measure  Help needed turning from your back to your side while in a flat bed without using bedrails?: None Help needed moving from lying on your back to  sitting on the side of a flat bed without using bedrails?: None Help needed moving to and from a bed to a chair (including a wheelchair)?: A Little Help needed standing up from a chair using your arms (e.g., wheelchair  or bedside chair)?: A Little Help needed to walk in hospital room?: A Little Help needed climbing 3-5 steps with a railing? : A Little 6 Click Score: 20    End of Session Equipment Utilized During Treatment: Gait belt Activity Tolerance: Patient tolerated treatment well Patient left: in chair;with call bell/phone within reach Nurse Communication: Mobility status PT Visit Diagnosis: Unsteadiness on feet (R26.81)     Time: 6073-7106 PT Time Calculation (min) (ACUTE ONLY): 31 min  Charges:    $Gait Training: 8-22 mins $Therapeutic Exercise: 8-22 mins PT General Charges $$ ACUTE PT VISIT: 1 Visit                     Hilton Cork, PT, DPT Secure Chat Preferred  Rehab Office [phone]   Arturo Morton Brion Aliment 02/07/2023, 3:08 PM

## 2023-02-07 NOTE — Progress Notes (Signed)
Appears pt may be stable for d/c this weekend. Contacted [facility] HD CSW to advise HD staff that pt may d/c this weekend and resume care on Monday. [facility] this weekend.   Olivia Canter Renal Navigator [phone]

## 2023-02-07 NOTE — Progress Notes (Addendum)
Admit: 02/05/2023 LOS: 2  Subjective:  Chase [name] is a 57 year old man with past medical history significant for non-Hodgkin's lymphoma, history of CVA, non-ST elevation MI, posttraumatic stress disorder and end-stage renal disease on hemodialysis MWF schedule with the [facility].  Patient had HD here yesterday and tolerated it well. He denies cramping with HD treatment. Patient has been tolerating PO diet today. He was concerned that he was not urinating yesterday after his foley catheter was removed. He has urinated this morning and feels relieved about that. He has not had a bowel movement yet, but expects that working with PT this morning may help. Denies fevers, chills, chest pain, palpitations, shortness of breath, abdominal pain or LE swelling. HD planned for tomorrow.   Per his recall: He dialyzes 3 hours and 45 minutes via his left upper arm AV fistula and his EDW is actively being revised; 74 kg to 72.5 kg and now patient is 70.3 kg although low PO intake recently 2/2 procedure.  11/07 0701 - 11/08 0700 In: -  Out: 601.5 [Urine:600]  Filed Weights   02/06/23 0500 02/06/23 0957 02/06/23 1347  Weight: 74.6 kg 71.9 kg 70.3 kg    Scheduled Meds:  acetaminophen  1,000 mg Oral Q6H   calcium acetate  667 mg Oral TID WC   Chlorhexidine Gluconate Cloth  6 each Topical Daily   cinacalcet  30 mg Oral Once per day on Monday Wednesday Friday   feeding supplement  237 mL Oral BID BM   gabapentin  100 mg Oral Once per day on Sunday Tuesday Thursday Saturday   gabapentin  300 mg Oral Q M,W,F   heparin injection (subcutaneous)  5,000 Units Subcutaneous Q12H   multivitamin with minerals  1 tablet Oral Daily   pantoprazole  20 mg Oral BID   polycarbophil  625 mg Oral BID   prazosin  1 mg Oral QHS   sodium chloride flush  3 mL Intravenous Q12H   sodium chloride flush  3 mL Intravenous Q12H   Continuous Infusions:  sodium chloride     lactated ringers     PRN Meds:.sodium chloride,  alum & mag hydroxide-simeth, amitriptyline, calcium carbonate, hydrALAZINE, HYDROmorphone (DILAUDID) injection, hydrOXYzine, lactated ringers, magic mouthwash, menthol-cetylpyridinium, methocarbamol (ROBAXIN) injection, methocarbamol, metoCLOPramide (REGLAN) injection, metoprolol tartrate, naphazoline-glycerin, ondansetron **OR** ondansetron (ZOFRAN) IV, phenol, simethicone, sodium chloride, sodium chloride flush, traMADol  Current Labs: reviewed CBC, BMP.  Outpt HD Orders -HD MWF at [facility]  Physical Exam:  Blood pressure 136/67, pulse 86, temperature 98.8 F (37.1 C), temperature source Oral, resp. rate 17, height 5\' 8"  (1.727 m), weight 70.3 kg, SpO2 92%. GEN: Laying in bed, nad, pleasant. EYES: no scleral icterus, eomi CV: RRR, no murmurs PULM: no iwob, bilateral chest rise, CTAB ABD: non-distended, non-tender to palpation. Surgical scars clean and intact with no purulence or active bleeding.  SKIN: no rashes or jaundice EXT: no LE edema, warm and well perfused Access: Left upper arm AV fistula Neuro: A&O x3 Psych: Pleasant mood and affect.     Recent Labs  Lab 02/05/23 0659 02/06/23 1055 02/06/23 1606 02/07/23 0622  NA 138 133* 132*  --   K 3.0* 3.3* 3.3* 3.5  CL 99 101 94*  --   CO2  --  19* 28  --   GLUCOSE 96 97 163*  --   BUN 26* 27* 9  --   CREATININE 6.10* 6.75* 3.90* 5.09*  CALCIUM  --  8.3* 8.1*  --   PHOS  --  3.4  --   --    Recent Labs  Lab 02/06/23 1055 02/06/23 1606 02/07/23 0622  WBC 8.6 7.9 6.2  HGB 9.7* 10.4* 9.9*  HCT 28.5* 30.7* 30.2*  MCV 96.9 95.9 96.2  PLT 172 171 147*   Assessment/Plan 1.  Status post colostomy takedown: Postoperative management per surgical service. Surgical incisions healing well.  2.  End-stage renal disease: Last HD treatment 11/07 which went well. Plan for dialysis tomorrow to maintain TTS schedule during the hospitalization. Typical HD treatment is with [facility] MWF. Will be able to return to usual MWF  schedule once discharged.  3.  Hypokalemia: Mildly depressed potassium level in days prior with K today of 3.5. Will use increased K bath and liberalize diet. Supplements given.  4.  Anemia of chronic disease: Hgb 9.9.  Continue to monitor.  5.  Hypertension/Volume: Blood pressure is well-controlled postoperatively and off of midodrine.  Midodrine recently discontinued approximately five weeks ago to allow for successful kidney transplant listing. Under dry weight, likely needs to be lowered on d/c.  6.  CKD-MBD: Will follow calcium and phosphorus levels and decide on additional adjustment of binder therapy. Patient typically takes phosphate binder with every meal TID.  7. Nutrition - Heart healthy diet with fluid restrictions. \ 8. Dispo - can d/c home after HD tomorrow.   Gilberto Better, PA student 02/07/2023, 1:00 PM

## 2023-02-08 LAB — POTASSIUM: Potassium: 4.3 mmol/L (ref 3.5–5.1)

## 2023-02-08 LAB — RENAL FUNCTION PANEL
Albumin: 2.8 g/dL — ABNORMAL LOW (ref 3.5–5.0)
Anion gap: 8 (ref 5–15)
BUN: 27 mg/dL — ABNORMAL HIGH (ref 6–20)
CO2: 24 mmol/L (ref 22–32)
Calcium: 8 mg/dL — ABNORMAL LOW (ref 8.9–10.3)
Chloride: 97 mmol/L — ABNORMAL LOW (ref 98–111)
Creatinine, Ser: 6.46 mg/dL — ABNORMAL HIGH (ref 0.61–1.24)
GFR, Estimated: 9 mL/min — ABNORMAL LOW (ref >60)
Glucose, Bld: 114 mg/dL — ABNORMAL HIGH (ref 70–99)
Phosphorus: 2.4 mg/dL — ABNORMAL LOW (ref 2.5–4.6)
Potassium: 4.2 mmol/L (ref 3.5–5.1)
Sodium: 129 mmol/L — ABNORMAL LOW (ref 135–145)

## 2023-02-08 LAB — CBC
HCT: 28.1 % — ABNORMAL LOW (ref 39.0–52.0)
HCT: 28.6 % — ABNORMAL LOW (ref 39.0–52.0)
Hemoglobin: 9.4 g/dL — ABNORMAL LOW (ref 13.0–17.0)
Hemoglobin: 9.5 g/dL — ABNORMAL LOW (ref 13.0–17.0)
MCH: 32.6 pg (ref 26.0–34.0)
MCH: 31.9 pg (ref 26.0–34.0)
MCHC: 33.5 g/dL (ref 30.0–36.0)
MCHC: 33.2 g/dL (ref 30.0–36.0)
MCV: 97.6 fL (ref 80.0–100.0)
MCV: 96 fL (ref 80.0–100.0)
Platelets: 156 10*3/uL (ref 150–400)
Platelets: 160 10*3/uL (ref 150–400)
RBC: 2.88 MIL/uL — ABNORMAL LOW (ref 4.22–5.81)
RBC: 2.98 MIL/uL — ABNORMAL LOW (ref 4.22–5.81)
RDW: 15.2 % (ref 11.5–15.5)
RDW: 15 % (ref 11.5–15.5)
WBC: 6.2 10*3/uL (ref 4.0–10.5)
WBC: 5.9 10*3/uL (ref 4.0–10.5)
nRBC: 0 % (ref 0.0–0.2)
nRBC: 0 % (ref 0.0–0.2)

## 2023-02-08 LAB — CREATININE, SERUM
Creatinine, Ser: 6.34 mg/dL — ABNORMAL HIGH (ref 0.61–1.24)
GFR, Estimated: 9 mL/min — ABNORMAL LOW (ref >60)

## 2023-02-08 MED ORDER — LIDOCAINE-PRILOCAINE 2.5-2.5 % EX CREA: 1.0000 | TOPICAL_CREAM | CUTANEOUS | Status: DC | PRN

## 2023-02-08 MED ORDER — PENTAFLUOROPROP-TETRAFLUOROETH EX AERO: 1.0000 | INHALATION_SPRAY | CUTANEOUS | Status: DC | PRN

## 2023-02-08 MED ORDER — CINACALCET HCL 30 MG PO TABS
30.0000 mg | ORAL_TABLET | Freq: Once | ORAL | Status: AC
  Administered 2023-02-08: 30 mg via ORAL
  Filled 2023-02-08: qty 1

## 2023-02-08 MED ORDER — LIDOCAINE HCL (PF) 1 % IJ SOLN: 5.0000 mL | INTRAMUSCULAR | Status: DC | PRN

## 2023-02-08 MED ORDER — GABAPENTIN 300 MG PO CAPS
300.0000 mg | ORAL_CAPSULE | Freq: Once | ORAL | Status: AC
  Administered 2023-02-08: 300 mg via ORAL
  Filled 2023-02-08: qty 1

## 2023-02-08 MED ORDER — ANTICOAGULANT SODIUM CITRATE 4% (200MG/5ML) IV SOLN
5.0000 mL | Status: DC | PRN
Start: 2023-02-08 — End: 2023-02-08

## 2023-02-08 MED ORDER — FUROSEMIDE 40 MG PO TABS
80.0000 mg | ORAL_TABLET | Freq: Once | ORAL | Status: AC
  Administered 2023-02-08: 80 mg via ORAL
  Filled 2023-02-08: qty 2

## 2023-02-08 MED ORDER — ALTEPLASE 2 MG IJ SOLR: 2.0000 mg | Freq: Once | INTRAMUSCULAR | Status: DC | PRN

## 2023-02-08 MED ORDER — HYDROMORPHONE HCL 1 MG/ML IJ SOLN
2.0000 mg | Freq: Once | INTRAMUSCULAR | Status: AC
  Administered 2023-02-08: 2 mg via INTRAVENOUS
  Filled 2023-02-08: qty 2

## 2023-02-08 MED ORDER — HEPARIN SODIUM (PORCINE) 1000 UNIT/ML DIALYSIS
1000.0000 [IU] | INTRAMUSCULAR | Status: DC | PRN
Start: 2023-02-08 — End: 2023-02-08

## 2023-02-08 MED ORDER — HEPARIN SODIUM (PORCINE) 1000 UNIT/ML DIALYSIS
40.0000 [IU]/kg | INTRAMUSCULAR | Status: DC | PRN
  Administered 2023-02-08: 2900 [IU] via INTRAVENOUS_CENTRAL
  Filled 2023-02-08: qty 3

## 2023-02-08 NOTE — Progress Notes (Signed)
02/08/2023  Chase [name] [id number] 1965/12/16  CARE TEAM: PCP: [facility]  Outpatient Care Team: Patient Care Team: [facility] as PCP - Rosezetta Schlatter, MD as Consulting Physician (Urology) Karie Soda, MD as Consulting Physician (General Surgery) Cannon Kettle, MD as Referring Physician (Pain Medicine) Kathlynn Grate, DO (Inactive) as Consulting Physician (Infectious Diseases) Micki Riley, MD as Consulting Physician (Neurology) Iva Boop, MD as Consulting Physician (Gastroenterology) Darnell Level, MD as Consulting Physician (Nephrology)  Inpatient Treatment Team: Treatment Team:  Karie Soda, MD Dagoberto Ligas, MD Oda Cogan, LPN Annalee Genta, RN Roselind Messier, RN Deveron Furlong, RN Kai Levins, [facility]   Problem List:   Principal Problem:   History of colonic diverticulitis Active Problems:   Chronic pain syndrome   Arthralgia of multiple sites   History of gastric bypass   History of stomach ulcers   Pernicious anemia   PTSD (post-traumatic stress disorder)   Anxiety and depression   History of CVA (cerebrovascular accident)   Anemia in chronic kidney disease   Personality disorder    ESRD (end stage renal disease) on dialysis [facility])   Chronic ischemic heart disease, unspecified   Gastroesophageal reflux disease   Hearing loss   Prostate cancer (HCC)   Hypokalemia   Hypomagnesemia   02/05/2023  POST-OPERATIVE DIAGNOSIS:   COLOSTOMY FOR COLON RESECTION. DESIRE FOR OSTOMY TAKEDOWN HISTORY OF COLOVESICAL FISTULA & PELVIC ABSCESSES DUE TO DIVERTICULITIS S/P HARTMANN COLECTOMY/COLOSTOMY     PROCEDURE:   ROBOTIC COLOSTOMY TAKEDOWN FLEXIBLE SIGMOIDOSCOPY TRANSVERSUS ABDOMINIS PLANE (TAP) BLOCK - BILATERAL   SURGEON:  Ardeth Sportsman, MD  OR FINDINGS:  Mild adhesions in the abdomen.  Old supraumbilical mesh abutting up to the end colostomy in the left upper quadrant.  Hold preperitoneal  mesh in the left lower quadrant from prior laparoscopic repair.  I believe that this done at the [place].   Patient has an end descending colon to anterior side rectal stump 31 EEA anastomosis that rests 14 cm from the anal verge by flexible sigmoidoscopy.   02/05/2023  Preoperative diagnosis:  Colostomy, desire for ostomy takedown   Postoperative diagnosis: same   Procedure(s): Cystoscopy with bilateral ureteral catheterization for firefly injection   Surgeon: Dr. Irine Seal   Intraoperative findings: unremarkable bladder; s/p TURP  Assessment [facility] Stay = 3 days) 3 Days Post-Op    Recovering relatively well so far  Plan:  ERAS protocol  Solid diet  Fiber bowel regimen.   Chronic pain.  Resumed amitriptyline and gabapentin.    Acute postop pain.  Scheduled Tylenol with ice and heat.  Narcotics and muscle relaxers for breakthrough.  -VTE prophylaxis- SCDs, etc  -mobilize as tolerated to help recovery  HD today.  Probably home 11/10.   Disposition:  The patient is from: Home Anticipate discharge to:  Home Anticipated Date of Discharge is:  November 8,2024   Barriers to discharge:  Consultant clearance & sign off  , Therapy assessment & Recommendations pending, Need for inpatient procedure/study, and Pending Clinical improvement (more likely than not)  Patient currently is NOT MEDICALLY STABLE for discharge from the hospital from a surgery standpoint.      I reviewed last 24 h vitals and pain scores, last 48 h intake and output, last 24 h labs and trends, and last 24 h imaging results.  I have reviewed this patient's available data, including medical history, events of note, test results, etc as part of my evaluation.  A significant portion of that time was spent in counseling. Care during the described time interval was provided by me.  This care required moderate level of medical decision making.  02/08/2023    Subjective: (Chief complaint)  + small  bm. + flatus. No n/v. Tolerating diet.   Objective:  Vital signs:  Vitals:   02/08/23 1030 02/08/23 1100 02/08/23 1130 02/08/23 1200  BP: 131/60 (!) 122/59 127/63 114/67  Pulse: 77 74 74 74  Resp: 16 10 13 13   Temp:      TempSrc:      SpO2: 95% 94% 96% 95%  Weight:      Height:        Last BM Date : 02/04/23  Intake/Output   Yesterday:  11/08 0701 - 11/09 0700 In: 483 [P.O.:480; I.V.:3] Out: -  This shift:  Total I/O In: 240 [P.O.:240] Out: 350 [Urine:350]  Bowel function:  Flatus: YES  BM:  No  Drain: (No drain)   Physical Exam:  General: Pt awake/alert in no acute distress.   Eyes: PERRL, normal EOM.  Sclera clear.  No icterus Psych:  No delerium/psychosis/paranoia.  Oriented x 4 Chest: no resp distress Abdomen: Soft.  Nondistended.  Mildly tender at incisions only.  I  No incarcerated hernias. Ext:   No deformity.  No mjr edema.  No cyanosis.  Left arm AV fistula with palpable thrill. Skin: No petechiae / purpurea.  No major sores.  Warm and dry    Results:   Cultures: No results found for this or any previous visit (from the past 720 hour(s)).  Labs: Results for orders placed or performed during the hospital encounter of 02/05/23 (from the past 48 hour(s))  Basic metabolic panel     Status: Abnormal   Collection Time: 02/06/23  4:06 PM  Result Value Ref Range   Sodium 132 (L) 135 - 145 mmol/L   Potassium 3.3 (L) 3.5 - 5.1 mmol/L   Chloride 94 (L) 98 - 111 mmol/L   CO2 28 22 - 32 mmol/L   Glucose, Bld 163 (H) 70 - 99 mg/dL    Comment: Glucose reference range applies only to samples taken after fasting for at least 8 hours.   BUN 9 6 - 20 mg/dL   Creatinine, Ser 7.62 (H) 0.61 - 1.24 mg/dL   Calcium 8.1 (L) 8.9 - 10.3 mg/dL   GFR, Estimated 17 (L) >60 mL/min    Comment: (NOTE) Calculated using the CKD-EPI Creatinine Equation (2021)    Anion gap 10 5 - 15    Comment: Performed at [facility], [address], [place], [place] [address]   CBC     Status: Abnormal   Collection Time: 02/06/23  4:06 PM  Result Value Ref Range   WBC 7.9 4.0 - 10.5 K/uL   RBC 3.20 (L) 4.22 - 5.81 MIL/uL   Hemoglobin 10.4 (L) 13.0 - 17.0 g/dL   HCT 76.1 (L) 60.7 - 37.1 %   MCV 95.9 80.0 - 100.0 fL   MCH 32.5 26.0 - 34.0 pg   MCHC 33.9 30.0 - 36.0 g/dL   RDW 06.2 69.4 - 85.4 %   Platelets 171 150 - 400 K/uL   nRBC 0.0 0.0 - 0.2 %    Comment: Performed at [facility], [address], [place], [place] [address]  Magnesium     Status: Abnormal   Collection Time: 02/06/23  4:06 PM  Result Value Ref Range   Magnesium 1.6 (L)  1.7 - 2.4 mg/dL    Comment: Performed at [facility], [address] [place], [place] [address]  CBC     Status: Abnormal   Collection Time: 02/07/23  6:22 AM  Result Value Ref Range   WBC 6.2 4.0 - 10.5 K/uL   RBC 3.14 (L) 4.22 - 5.81 MIL/uL   Hemoglobin 9.9 (L) 13.0 - 17.0 g/dL   HCT 60.4 (L) 54.0 - 98.1 %   MCV 96.2 80.0 - 100.0 fL   MCH 31.5 26.0 - 34.0 pg   MCHC 32.8 30.0 - 36.0 g/dL   RDW 19.1 47.8 - 29.5 %   Platelets 147 (L) 150 - 400 K/uL   nRBC 0.0 0.0 - 0.2 %    Comment: Performed at [facility], [address], [place], [place] [address]  Potassium     Status: None   Collection Time: 02/07/23  6:22 AM  Result Value Ref Range   Potassium 3.5 3.5 - 5.1 mmol/L    Comment: Performed at [facility], [address] [place], [place] [address]  Creatinine, serum     Status: Abnormal   Collection Time: 02/07/23  6:22 AM  Result Value Ref Range   Creatinine, Ser 5.09 (H) 0.61 - 1.24 mg/dL   GFR, Estimated 12 (L) >60 mL/min    Comment: (NOTE) Calculated using the CKD-EPI Creatinine Equation (2021) Performed at [facility], [address], [place], [place] [address]   CBC     Status: Abnormal   Collection Time: 02/08/23  5:39 AM  Result Value Ref Range   WBC 6.2 4.0 - 10.5 K/uL   RBC 2.88 (L) 4.22 - 5.81 MIL/uL   Hemoglobin 9.4 (L) 13.0 - 17.0 g/dL   HCT 95.2 (L)  84.1 - 52.0 %   MCV 97.6 80.0 - 100.0 fL   MCH 32.6 26.0 - 34.0 pg   MCHC 33.5 30.0 - 36.0 g/dL   RDW 32.4 40.1 - 02.7 %   Platelets 156 150 - 400 K/uL   nRBC 0.0 0.0 - 0.2 %    Comment: Performed at [facility], [address], [place], [place] [address]  Potassium     Status: None   Collection Time: 02/08/23  5:39 AM  Result Value Ref Range   Potassium 4.3 3.5 - 5.1 mmol/L    Comment: Performed at [facility], [address] [place], [place] [address]  Creatinine, serum     Status: Abnormal   Collection Time: 02/08/23  5:39 AM  Result Value Ref Range   Creatinine, Ser 6.34 (H) 0.61 - 1.24 mg/dL   GFR, Estimated 9 (L) >60 mL/min    Comment: (NOTE) Calculated using the CKD-EPI Creatinine Equation (2021) Performed at [facility], [address], [place], [place] [address]   Renal function panel     Status: Abnormal   Collection Time: 02/08/23  5:39 AM  Result Value Ref Range   Sodium 129 (L) 135 - 145 mmol/L   Potassium 4.2 3.5 - 5.1 mmol/L   Chloride 97 (L) 98 - 111 mmol/L   CO2 24 22 - 32 mmol/L   Glucose, Bld 114 (H) 70 - 99 mg/dL    Comment: Glucose reference range applies only to samples taken after fasting for at least 8 hours.   BUN 27 (H) 6 - 20 mg/dL   Creatinine, Ser 5.63 (H) 0.61 - 1.24 mg/dL   Calcium 8.0 (L) 8.9 -  10.3 mg/dL   Phosphorus 2.4 (L) 2.5 - 4.6 mg/dL   Albumin 2.8 (L) 3.5 - 5.0 g/dL   GFR, Estimated 9 (L) >60 mL/min    Comment: (NOTE) Calculated using the CKD-EPI Creatinine Equation (2021)    Anion gap 8 5 - 15    Comment: Performed at [facility], [address] [place], [place] [address]    Imaging / Studies: No results found.  Medications / Allergies: per chart  Antibiotics: Anti-infectives (From admission, onward)    Start     Dose/Rate Route Frequency Ordered Stop   02/05/23 2100  cefoTEtan (CEFOTAN) 2 g in sodium chloride 0.9 % 100 mL IVPB        2 g 200 mL/hr over 30 Minutes Intravenous Every 12 hours  02/05/23 1334 02/05/23 2102   02/05/23 1400  neomycin (MYCIFRADIN) tablet 1,000 mg  Status:  Discontinued       Placed in "And" Linked Group   1,000 mg Oral 3 times per day 02/05/23 0633 02/05/23 0635   02/05/23 1400  metroNIDAZOLE (FLAGYL) tablet 1,000 mg  Status:  Discontinued       Placed in "And" Linked Group   1,000 mg Oral 3 times per day 02/05/23 0633 02/05/23 0635   02/05/23 0645  cefoTEtan (CEFOTAN) 2 g in sodium chloride 0.9 % 100 mL IVPB        2 g 200 mL/hr over 30 Minutes Intravenous On call to O.R. 02/05/23 6295 02/05/23 0919         Note: Portions of this report may have been transcribed using voice recognition software. Every effort was made to ensure accuracy; however, inadvertent computerized transcription errors may be present.   Any transcriptional errors that result from this process are unintentional.     02/08/2023  12:22 PM

## 2023-02-08 NOTE — Plan of Care (Signed)

## 2023-02-08 NOTE — Procedures (Signed)
Patient seen on Hemodialysis. BP 131/60   Pulse 70   Temp 98 F (36.7 C) (Oral)   Resp 18   Ht 5\' 8"  (1.727 m)   Wt 71.9 kg Comment: standing  SpO2 96%   BMI 24.10 kg/m   QB 400, UF goal 1L Tolerating treatment without complaints at this time. Likely to be DC home later today after HD.    Zetta Bills MD [facility]. Office # [phone] Pager # [phone] 9:57 AM

## 2023-02-08 NOTE — Progress Notes (Signed)
Received patient in bed to unit.  Alert and oriented.  Informed consent signed and in chart.   TX duration:3 hours and 45 minutes  Patient tolerated well.  Transported back to the room  Alert, without acute distress.  Hand-off given to patient's nurse.   Access used: Left upper arm fistula Access issues: none  Total UF removed: 1L Medication(s) given: Dilaudid   02/08/23 1234  Vitals  Temp 99.1 F (37.3 C)  Temp Source Oral  BP 133/65  MAP (mmHg) 84  Pulse Rate 75  Resp (!) 21  Oxygen Therapy  SpO2 100 %  O2 Device Room Air  During Treatment Monitoring  HD Safety Checks Performed Yes  Intra-Hemodialysis Comments Tx completed;Tolerated well  Dialysis Fluid Bolus Normal Saline  Bolus Amount (mL) 300 mL  Fistula / Graft Left Upper arm Arteriovenous fistula  Placement Date/Time: 10/16/20 1358   Placed prior to admission: No  Orientation: Left  Access Location: Upper arm  Access Type: (c) Arteriovenous fistula  Status Deaccessed     Stacie Glaze LPN [facility]

## 2023-02-09 MED ORDER — METHOCARBAMOL 1000 MG PO TABS: 1000.0000 mg | ORAL_TABLET | Freq: Three times a day (TID) | ORAL | 0 refills | Status: AC | PRN

## 2023-02-09 NOTE — Discharge Summary (Signed)
Physician Discharge Summary  Patient ID: Chase [name] MRN: [id number] DOB/AGE: 05-19-1965 57 y.o.  Admit date: 02/05/2023 Discharge date: 02/09/2023  Admission Diagnoses:  Discharge Diagnoses:  Principal Problem:   History of colonic diverticulitis Active Problems:   Chronic pain syndrome   Arthralgia of multiple sites   History of gastric bypass   History of stomach ulcers   Pernicious anemia   PTSD (post-traumatic stress disorder)   Anxiety and depression   History of CVA (cerebrovascular accident)   Anemia in chronic kidney disease   Personality disorder    ESRD (end stage renal disease) on dialysis (HCC)   Chronic ischemic heart disease, unspecified   Gastroesophageal reflux disease   Hearing loss   Prostate cancer (HCC)   Hypokalemia   Hypomagnesemia   Discharged Condition: stable  Hospital Course:  Patient was admitted to the floor following robotic colostomy takedown 02/05/23.  He did well post op.  He received HD on schedule for his ESRD.   Diet was able to be advanced.  Foley removed.  He was ambulating independently.  Pain was controlled with oral meds adequately.  He is discharged in stable condition.   Consults: nephrology  Significant Diagnostic Studies: labs: HCT prior to d/c 28.6.   Treatments: dialysis: Hemodialysis and surgery: see above  Discharge Exam: Blood pressure 133/67, pulse 77, temperature 98.2 F (36.8 C), resp. rate 18, height 5\' 8"  (1.727 m), weight 71 kg, SpO2 96%. General appearance: alert, cooperative, and no distress Resp: breathing comfortably GI: soft, non distended, no erythema.    Disposition: Discharge disposition: 01-Home or Self Care       Discharge Instructions     Call MD for:   Complete by: As directed    FEVER > 101.5 F  (temperatures < 101.5 F are not significant)   Call MD for:  difficulty breathing, headache or visual disturbances   Complete by: As directed    Call MD for:  extreme fatigue   Complete by:  As directed    Call MD for:  hives   Complete by: As directed    Call MD for:  persistant dizziness or light-headedness   Complete by: As directed    Call MD for:  persistant nausea and vomiting   Complete by: As directed    Call MD for:  persistant nausea and vomiting   Complete by: As directed    Call MD for:  redness, tenderness, or signs of infection (pain, swelling, redness, odor or green/yellow discharge around incision site)   Complete by: As directed    Call MD for:  redness, tenderness, or signs of infection (pain, swelling, redness, odor or green/yellow discharge around incision site)   Complete by: As directed    Call MD for:  severe uncontrolled pain   Complete by: As directed    Call MD for:  severe uncontrolled pain   Complete by: As directed    Call MD for:  temperature >100.4   Complete by: As directed    Diet - low sodium heart healthy   Complete by: As directed    Start with a bland diet such as soups, liquids, starchy foods, low fat foods, etc. the first few days at home. Gradually advance to a solid, low-fat, high fiber diet by the end of the first week at home.   Add a fiber supplement to your diet (Metamucil, etc) If you feel full, bloated, or constipated, stay on a full liquid or pureed/blenderized diet for a few  days until you feel better and are no longer constipated.   Diet - low sodium heart healthy   Complete by: As directed    Discharge instructions   Complete by: As directed    See Discharge Instructions If you are not getting better after two weeks or are noticing you are getting worse, contact our office [phone] for further advice.  We may need to adjust your medications, re-evaluate you in the office, send you to the emergency room, or see what other things we can do to help. The clinic staff is available to answer your questions during regular business hours (8:30am-5pm).  Please don't hesitate to call and ask to speak to one of our nurses for  clinical concerns.    A surgeon from [facility] is always on call at the hospitals 24 hours/day If you have a medical emergency, go to the nearest emergency room or call 911.   Discharge wound care:   Complete by: As directed    It is good for closed incisions and even open wounds to be washed every day.  Shower every day.  Short baths are fine.  Wash the incisions and wounds clean with soap & water.    You may leave closed incisions open to air if it is dry.   You may cover the incision with clean gauze & replace it after your daily shower for comfort.  TEGADERM:  You have clear gauze band-aid dressings over your closed incision(s).  Remove the dressings 3 days after surgery = 02/08/2023 Saturday   Driving Restrictions   Complete by: As directed    You may drive when: - you are no longer taking narcotic prescription pain medication - you can comfortably wear a seatbelt - you can safely make sudden turns/stops without pain.   Increase activity slowly   Complete by: As directed    Start light daily activities --- self-care, walking, climbing stairs- beginning the day after surgery.  Gradually increase activities as tolerated.  Control your pain to be active.  Stop when you are tired.  Ideally, walk several times a day, eventually an hour a day.   Most people are back to most day-to-day activities in a few weeks.  It takes 4-6 weeks to get back to unrestricted, intense activity. If you can walk 30 minutes without difficulty, it is safe to try more intense activity such as jogging, treadmill, bicycling, low-impact aerobics, swimming, etc. Save the most intensive and strenuous activity for last (Usually 4-8 weeks after surgery) such as sit-ups, heavy lifting, contact sports, etc.  Refrain from any intense heavy lifting or straining until you are off narcotics for pain control.  You will have off days, but things should improve week-by-week. DO NOT PUSH THROUGH PAIN.  Let pain be your  guide: If it hurts to do something, don't do it.   Increase activity slowly   Complete by: As directed    Lifting restrictions   Complete by: As directed    If you can walk 30 minutes without difficulty, it is safe to try more intense activity such as jogging, treadmill, bicycling, low-impact aerobics, swimming, etc. Save the most intensive and strenuous activity for last (Usually 4-8 weeks after surgery) such as sit-ups, heavy lifting, contact sports, etc.   Refrain from any intense heavy lifting or straining until you are off narcotics for pain control.  You will have off days, but things should improve week-by-week. DO NOT PUSH THROUGH PAIN.  Let pain  be your guide: If it hurts to do something, don't do it.  Pain is your body warning you to avoid that activity for another week until the pain goes down.   May shower / Bathe   Complete by: As directed    May walk up steps   Complete by: As directed    Remove dressing in 72 hours   Complete by: As directed    Make sure all dressings are removed by the third day after surgery.  Leave incisions open to air.  OK to cover incisions with gauze or bandages as desired   Sexual Activity Restrictions   Complete by: As directed    You may have sexual intercourse when it is comfortable. If it hurts to do something, stop.      Allergies as of 02/09/2023       Reactions   Nsaids Other (See Comments)   Bleeding GI Ulceration   Aspirin Nausea And Vomiting   Ciprofloxacin Itching   Ketorolac Tromethamine Other (See Comments)   Serum creatinine above reference range        Medication List     TAKE these medications    acetaminophen 500 MG tablet Commonly known as: TYLENOL Take 500-1,000 mg by mouth in the morning, at noon, and at bedtime.   amitriptyline 10 MG tablet Commonly known as: ELAVIL Take 10 mg by mouth at bedtime as needed for sleep.   Calcium Acetate 667 MG Tabs Take 667 mg by mouth in the morning, at noon, and at  bedtime.   calcium carbonate 500 MG chewable tablet Commonly known as: TUMS - dosed in mg elemental calcium Chew 1 tablet (200 mg of elemental calcium total) by mouth 3 (three) times daily. What changed:  how much to take when to take this reasons to take this   cinacalcet 30 MG tablet Commonly known as: SENSIPAR Take 30 mg by mouth 3 (three) times a week. Take after dialysis M-w-F   cyanocobalamin 1000 MCG/ML injection Commonly known as: VITAMIN B12 Inject 1,000 mcg into the muscle every 30 (thirty) days.   furosemide 80 MG tablet Commonly known as: LASIX Take 80 mg by mouth daily.   gabapentin 300 MG capsule Commonly known as: NEURONTIN Take 300 mg by mouth See admin instructions. Take 300 mg by mouth once daily on Monday, Wednesday, Friday   gabapentin 100 MG capsule Commonly known as: NEURONTIN Take 100 mg by mouth See admin instructions. Take 100 mg once daily on Tuesday, Thursday, Saturday, and Sunday,   hydrOXYzine 10 MG tablet Commonly known as: ATARAX Take 1 tablet (10 mg total) by mouth 3 (three) times daily as needed for itching or anxiety. What changed:  when to take this reasons to take this   Methocarbamol 1000 MG Tabs Take 1,000 mg by mouth every 8 (eight) hours as needed for muscle spasms.   midodrine 10 MG tablet Commonly known as: PROAMATINE Take 1 tablet (10 mg total) by mouth daily.   MONOJECT PREFILL HEPARIN SOD IV Inject into the vein. Given at Dialysis Mondays, Wednesdays, and Fridays   multivitamin with minerals Tabs tablet Take 1 tablet by mouth daily.   NARCAN NA Place 4 mg into the nose as needed (overdose).   pantoprazole 20 MG tablet Commonly known as: PROTONIX TAKE 1 TABLET (20 MG TOTAL) BY MOUTH 2 (TWO) TIMES DAILY. What changed: how much to take   prazosin 1 MG capsule Commonly known as: MINIPRESS Take 1 mg by mouth at bedtime.  traMADol 50 MG tablet Commonly known as: ULTRAM Take 1-2 tablets (50-100 mg total) by mouth  every 6 (six) hours as needed for moderate pain (pain score 4-6) or severe pain (pain score 7-10).               Discharge Care Instructions  (From admission, onward)           Start     Ordered   02/05/23 0000  Discharge wound care:       Comments: It is good for closed incisions and even open wounds to be washed every day.  Shower every day.  Short baths are fine.  Wash the incisions and wounds clean with soap & water.    You may leave closed incisions open to air if it is dry.   You may cover the incision with clean gauze & replace it after your daily shower for comfort.  TEGADERM:  You have clear gauze band-aid dressings over your closed incision(s).  Remove the dressings 3 days after surgery = 02/08/2023 Saturday   02/05/23 8119            Follow-up Information     Karie Soda, MD Follow up on 03/04/2023.   Specialties: General Surgery, Colon and Rectal Surgery Why: To follow up after your operation                Signed: Almond Lint 02/09/2023, 11:39 AM

## 2023-02-09 NOTE — Progress Notes (Signed)
Patient stated he will be driving himself home after discharge. Educated patient that he received pain medications and should not operate a vehicle. Patient refused and stated he will be driving himself home.

## 2023-02-09 NOTE — Plan of Care (Signed)

## 2023-02-10 NOTE — Progress Notes (Signed)
Late Note entry- Feb 10, 2023  Pt was d/c yesterday. D/C summary and renal notes faxed to [facility] this morning (fax#[phone]). Clinic advised on Friday of likely d/c and that pt would likely resume today.   Olivia Canter Renal Navigator [phone]

## 2023-03-04 NOTE — Progress Notes (Signed)
 REFERRING PHYSICIAN:  Self  Patient Care Team: Clinic, [facility]ien as PCP - General Borden, Gretel Glean Raddle., MD (Urology) Gretta Lonni PARAS, MD (Vascular Surgery) Campbell Gerilyn LABOR, MD (Internal Medicine) Sari Jayson Carol, MD (Nephrology)  PROVIDER:  ELSPETH JUDAH SCHULTZE, MD  [facility] MRN: [id number] DOB: 1965/08/31 DATE OF ENCOUNTER: 03/04/2023  SUBJECTIVE   Chief Complaint: Post Operative Visit (- PO ROBOTIC OSTOMY TAKEDOWN)     History of Present Illness: Chase [name] is a 57 y.o. male who is seen today status post robotic hist of adhesions and colostomy takedown 02/05/2023.  Pathology notes squama colonic junctional mucosa with reactive because of changes consistent with colostomy.  Benign colon.  Patient comes in for his postop visit.  He went home postop day 4 after getting his dialysis.  He is back to his usual outpatient hemodialysis.  Eating well.  Had a couple of mild loose bowel movements that Imodium controlled.  Thinking about get back on a fiber supplement.  Denies pain.  Already went up to [facility] for kidney transplant evaluation and is feeling more optimistic.  No fevers or chills.  No nausea or vomiting.  Feeling stronger.  PRIOR VISIT 12/04/2022 Patient returns a year and a half.  He had presented with severe diverticulitis suspicious for colovesical fistula along with bladder outlet obstruction requiring stenting's and PERC nephrostomy tube.  I did a Hartmann resection on him January 2022.  He has had intermittent nephrostomy tubes and stents.  A lot of issues with compliance and needing care through the [facility].  Eventually had worsening renal function.  However he has gained weight and gotten healthier.  [facility].  He is dialysis dependent Monday Wednesday Friday at the [facility] through a left upper arm AV fistula.SABRA  He is is hoping for his colostomy to be taken down.  He has been following up with [facility]  Dr. Renda.   Found to have prostate cancer  Apparently 1/12 biopsies noted so very early in most likely going to be monitoring only. Had a TURP for some chronic BPH issues.    It took some time for the past year and a half but now the patient has underwent cardiac clearance through the [facility].  Acceptable risk.  Patient got colonoscopy done last month which showed no masses or tumors in the colon or rectal stump.  Patient is working to try and be on the kidney transplant list and was told he needed his colostomy taken down so he is asking that happen.  He moves his bowels 2-3 times a day.  No severe diarrhea on Imodium needing anymore.  He usually can keep his bag on most days.  Has to wear tape for some occasional leaking.  He does walk with a cane.  Can do about 10 minutes.  He does have some mild urinary incontinence and wears diapers.  However no more nephrostomy tubes.  I believe no more stenting.  He walks with a cane.  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  He is not on any blood thinners.    PRIOR VISIT 04/24/2021 Patient returns a year after having surgery.  Veteran.  Had chronic colovesical fistula.  Worsening hydronephrosis and chronic kidney disease.  Followed by urology Dr. Renda and myself.  Has a problem of no showing.  A challenge to get to surgery.  Recurrent episodes of urosepsis.  Intentionally readmitted.  Noncompliant with bowel  prep.  Worsening decline.  Ended up having to do a laparoscopic Hartmann resection with colovesical fistula repair and urology has done intermittent ureteral stent exchange.  Patient declined colostomy care through [facility] and wished to go back to the [place] [facility].  Patient had recurring hydronephrosis issues and has progressed to kidney failure requiring an AV fistula.  Now on dialysis.  Looks like the [place] [facility] was trying to get him back to see us  last fall.  Try to get him to show up to clinic but he has no showed.   History of PTSD and personality disorder with struggle of intermittent noncompliance.  Now a year later he is coming in interested in colostomy takedown.   Patient comes today by himself.  He is back to get his care at the [facility] system.  Have not seen him in a year.  His main complaint is that he has a lot of loose bowel movements in his colostomy and gets accidents and explosions.  Sometimes has to replace the appliance a couple times a day.  Sounds like he occasionally use Imodium but he cannot tell me what he consistently does.  Looks like he averages between 1-3 a day.  Not on a fiber supplement.  He finds it frustrating embarrassing.  It sounds like he still has stents in his ureters and is followed by [facility].  He believes he is going to see Dr. Renda next week with [facility].  He walks with a cane.  He does note that sometimes he uses a scooter to get around at the dialysis center.  He has had some mildly elevated glucoses but does not recall being told he has diabetes.  Has not smoked in 20 years.  He has gained a lot of weight back and is now 170 pounds.  He was in the 110s.     Medical History:  Past Medical History:  Diagnosis Date  . Anemia   . Anxiety   . Arthritis   . Chronic kidney disease   . GERD (gastroesophageal reflux disease)   . History of cancer   . History of stroke     Patient Active Problem List  Diagnosis  . History of colonic diverticulitis  . 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 ESRD (end stage renal disease) on dialysis (CMS/HHS-HCC)  . Colovesical fistula  . Prostate cancer (CMS/HHS-HCC)  . S/P colostomy takedown    History reviewed. No pertinent surgical history.   Allergies  Allergen Reactions  . Nsaids (Non-Steroidal Anti-Inflammatory Drug) Other (See Comments)    Bleeding GI Ulceration Bleeding GI Ulceration   . Aspirin  Other (See Comments)  . Ciprofloxacin  Nausea And Vomiting    Current Outpatient Medications on File Prior to  Visit  Medication Sig Dispense Refill  . acetaminophen  (TYLENOL ) 500 MG tablet TAKE ONE TABLET BY MOUTH THREE TIMES A DAY AS NEEDED FOR CHRONIC PAIN    . amitriptyline  (ELAVIL ) 10 MG tablet TAKE ONE TABLET BY MOUTH EVERY EVENING AT 8PM FOR MUSCLE AND NERVE PAIN    . aspirin  81 MG EC tablet TAKE 1 TABLET BY MOUTH EVERY DAY WITH BREAKFAS. SWALLOW WHOLE (Patient not taking: Reported on 12/04/2022)    . atorvastatin  (LIPITOR) 10 MG tablet Take 1 tablet by mouth once daily (Patient not taking: Reported on 12/04/2022)    . bisacodyL  (DULCOLAX) 5 mg EC tablet Take 4 tablets (20 mg total) by mouth once daily as needed for Constipation for up to 1 dose 4  tablet 0  . calcium  acetate,phosphat bind, (PHOSLO ) 667 mg capsule Take by mouth    . calcium  carbonate (TUMS) 200 mg calcium  (500 mg) chewable tablet Take by mouth    . cyanocobalamin (VITAMIN B12) 1,000 mcg/mL injection Inject into the muscle    . DULoxetine  (CYMBALTA ) 30 MG DR capsule Take 30 mg by mouth 2 (two) times daily    . FUROsemide  (LASIX ) 80 MG tablet Take by mouth    . gabapentin  (NEURONTIN ) 100 MG capsule TAKE 2 CAPSULES (200 MG TOTAL) BY MOUTH DAILY.    . hydrOXYzine  HCL (ATARAX ) 10 MG tablet TAKE ONE TABLET BY MOUTH THREE TIMES A DAY AS NEEDED (MAY CAUSE DROWSINESS)    . midodrine  (PROAMATINE ) 10 MG tablet Take 1 tablet by mouth once daily    . naloxone (NARCAN) 4 mg/actuation nasal spray SPRAY 1 SPRAY INTO ONE NOSTRIL AS DIRECTED FOR OPIOID OVERDOSE (TURN PERSON ON SIDE AFTER DOSE. IF NO RESPONSE IN 2-3 MINUTES OR PERSON RESPONDS BUT RELAPSES, REPEAT USING A NEW SPRAY DEVICE AND SPRAY INTO THE OTHER NOSTRIL. CALL 911 AFTER USE.) * EMERGENCY USE ONLY *    . oxyCODONE  (ROXICODONE ) 5 MG immediate release tablet TAKE ONE TABLET BY MOUTH THREE TIMES A DAY AS NEEDED FOR BREAKTHROUGH CHRONIC PAIN (5/3-OVERNIGHT) (Patient not taking: Reported on 12/04/2022)    . pantoprazole  (PROTONIX ) 20 MG DR tablet TAKE ONE TABLET BY MOUTH TWICE A DAY (TAKE ON AN EMPTY  STOMACH 30 MINUTES PRIOR TO A MEAL)    . polyethylene glycol (MIRALAX ) powder Take by mouth (Patient not taking: Reported on 12/04/2022)    . prazosin  (MINIPRESS ) 1 MG capsule Take 1 capsule by mouth at bedtime    . silodosin  (RAPAFLO ) 8 mg capsule Take by mouth (Patient not taking: Reported on 12/04/2022)    . tamsulosin  (FLOMAX ) 0.4 mg capsule Take 1 capsule by mouth at bedtime (Patient not taking: Reported on 12/04/2022)    . traMADoL  (ULTRAM ) 50 mg tablet Take by mouth every 8 (eight) hours as needed     No current facility-administered medications on file prior to visit.    Family History  Problem Relation Age of Onset  . High blood pressure (Hypertension) Mother      Social History   Tobacco Use  Smoking Status Former  . Types: Cigarettes  Smokeless Tobacco Never     Social History   Socioeconomic History  . Marital status: Widowed  Tobacco Use  . Smoking status: Former    Types: Cigarettes  . Smokeless tobacco: Never  Vaping Use  . Vaping status: Never Used  Substance and Sexual Activity  . Alcohol use: Never  . Drug use: Never   Social Drivers of Health   Food Insecurity: No Food Insecurity (01/24/2022)   Received from [facility]   Hunger Vital Sign   . Worried About Programme Researcher, Broadcasting/film/video in the Last Year: Never true   . Ran Out of Food in the Last Year: Never true  Transportation Needs: No Transportation Needs (01/24/2022)   Received from [facility] - Transportation   . Lack of Transportation (Medical): No   . Lack of Transportation (Non-Medical): No   [facility]   Social Network    ############################################################  Review of Systems: A complete review of systems (ROS) was obtained from the patient.  I have reviewed this information and discussed as appropriate with the patient.  See HPI as well for other pertinent ROS.  Constitutional:  No fevers, chills, sweats.  Weight stable Eyes:   No vision changes, No discharge HENT:  No sore throats, nasal drainage Lymph: No neck swelling, No bruising easily Pulmonary:  No cough, productive sputum CV: No orthopnea, PND  Patient walks 10 minutes for about 1/4 miles gradually.  No exertional chest/neck/shoulder/arm pain.  GI:  No personal nor family history of GI/colon cancer, inflammatory bowel disease, irritable bowel syndrome, allergy such as Celiac Sprue, dietary/dairy problems, colitis, ulcers nor gastritis.  No recent sick contacts/gastroenteritis.  No travel outside the country.  No changes in diet.  Renal: No more UTIs, No hematuria Genital:  No drainage, bleeding, masses Musculoskeletal: No severe joint pain.  Good ROM major joints Skin:  No sores or lesions Heme/Lymph:  No easy bleeding.  No swollen lymph nodes  OBJECTIVE   There were no vitals filed for this visit.   There is no height or weight on file to calculate BMI.  PHYSICAL EXAM:   Constitutional: Not cachectic.  Weight in the much more normal range now.  Hygeine adequate.  Vitals signs as above.  Walk around normally without any guarding or hesitancy. Eyes: Pupils reactive, normal extraocular movements. Sclera nonicteric Neuro: CN II-XII intact.  No major focal sensory defects.  No major motor deficits. Lymph: No head/neck/groin lymphadenopathy Psych:  No severe agitation.  No severe anxiety.  Still with some pressured speech and interrupting often.  Occasionally irritated/frustrated but mostly consolable.  Not quite to the point of mania.  Judgment & insight Adequate, Oriented x4, HENT: Normocephalic, Mucus membranes moist.  No thrush.  Hearing: adequate Neck: Supple, No tracheal deviation.  No obvious thyromegaly Chest: No pain to chest wall compression.  Good respiratory excursion.  No audible wheezing CV:  Pulses intact.   Regular rhythm.  No major extremity edema Ext: No obvious deformity or contracture.  Edema: Not present.  No cyanosis Skin: No major  subcutaneous nodules.  Warm and dry Musculoskeletal: Severe joint rigidity not present.  No obvious clubbing.  No digital petechiae.  Mobility: uses cane moving easily without restrictions Patient claims uses a rolling walker at dialysis.  Abdomen: Left upper quadrant old colostomy site with normal healing ridge.  No evidence of any incisional or recurrent hernia.  No guarding.  Flat Soft.   Nondistended.  Nontender.    Hernia: Not present. Diastasis recti: Mild supraumbilical midline.  No hepatomegaly.  No splenomegaly.  Genital/Pelvic:  Inguinal hernia: Not present.  Inguinal lymph nodes: without lymphadenopathy nor hidradenitis.    Rectal: Perianal skin clear.  Intact sphincter function with low range but normal squeeze.  No anal rectal masses.  Posterior midline scarring given history of prior anal fissure but not active.  Hemorrhoids grade 1 or 2.  Scant mucus balls and up mid rectum.  I cannot feel the upper end of the rectal stump at 7cm      ###################################################################  Labs, Imaging and Diagnostic Testing:  Located in 'Care Everywhere' section of Epic EMR chart   PRIOR [facility] CLINIC NOTES:  Located in 'Care Everywhere' section of Epic EMR chart   SURGERY NOTES:  Located in 'Care Everywhere' section of Epic EMR chart   04/18/2020  POST-OPERATIVE DIAGNOSIS:   SIGMOID DIVERTICULITIS EPIGASTRIC INCISIONAL HERNIA   PROCEDURE:   LAPAROSCOPIC SIGMOID COLOTOMY WITH COLOSTOMY (HARTMANN) INCISIONAL EPIGASTRIC HERNIA REPAIR - PRIMARY SUTURE   SURGEON:  Elspeth KYM Schultze, MD   OR FINDINGS:    Patient had very redundant stretched out sigmoid colon.  Inflammation at the descending/sigmoid  junction along the left lateral pelvis.  Only real area of adhesion to the pelvis.  No strong evidence of any active colovesical fistula.   Hartmann resection done of redundant sigmoid colon.  Prolene sutures at rectal stump.   It is an end descending  colostomy in the left upper paramedian region at premarked location just inferior to intraperitoneal polypropylene mesh from prior incisional hernia repair    1 cm epigastric hernia containing preperitoneal fat only.  Primarily repaired  PATHOLOGY:  Located in 'Care Everywhere' section of Epic EMR chart  Clinical History: Colo vesical fistula (ms)    FINAL MICROSCOPIC DIAGNOSIS:   A. COLON, SIGMOID, RESECTION:  -  Segment of colon with fibrous serosal adhesions  -  Three benign lymph nodes (0/3)  -  Viable margins  -  No malignancy identified   Assessment and Plan:  DIAGNOSES:  Diagnoses and all orders for this visit:  History of colonic diverticulitis  ESRD (end stage renal disease) on dialysis (CMS/HHS-HCC)  Colovesical fistula  S/P colostomy takedown      ASSESSMENT/PLAN  Patient with a very complicated history requiring urgent Hartmann resection 2022 for colovesical fistula causing recurrent UTI/cystitis, hydronephrosis and ultimately ESRD renal failure.  Recovering status post robotic lysis of adhesions and takedown of colostomy 3 weeks ago.  He is feeling really good so far.  Solid diet as tolerated.  Consider doing daily fiber supplement to help out.  He is not needing Imodium as much.  He gets dialysis Monday Wednesday Friday through the [facility].  Sounds like he has very early prostate cancer being monitored [facility] with bladder outlet obstruction palliated with TURP 11 months ago.  He recently went up to [facility] for kidney transplant evaluation.  He is optimistic now that the colostomy is down that that will make it more likely that can happen.  I wished him well.  At this point I think it safe to follow-up as needed.  He is very appreciative care and give me a pink sheet.  He has been through a lot.  Hopefully he can gradually stabilize and improve over time.    FOLLOWUP:  Return if symptoms worsen or fail to improve.    The  plan was discussed in detail with the patient today, who expressed understanding & appreciation.  The patient has my contact information, and understands to call us  with any additional questions or concerns in the interval.  I would be happy to see the patient back sooner if the need arises.  Portions of this report may have been transcribed using voice recognition software. Every effort was made to ensure accuracy; however, inadvertent computerized transcription errors may be present.   Any transcriptional errors that result from this process are unintentional.  ########################################################   Elspeth KYM Schultze, MD, FACS, MASCRS Esophageal, Gastrointestinal & Colorectal Surgery Robotic and Minimally Invasive Surgery  [facility], [facility]  [facility]  [address], [address] [place], [place] [address] [phone] Fax [phone] Main              03/04/2023
